# Patient Record
Sex: Male | Born: 1950 | Race: White | Hispanic: No | Marital: Married | Smoking: Never smoker
Health system: Southern US, Community
[De-identification: ages and names within clinical notes are randomized; demographics above are authoritative.]

## PROBLEM LIST (undated history)

## (undated) DIAGNOSIS — E785 Hyperlipidemia, unspecified: Secondary | ICD-10-CM

## (undated) DIAGNOSIS — M109 Gout, unspecified: Secondary | ICD-10-CM

## (undated) DIAGNOSIS — I469 Cardiac arrest, cause unspecified: Secondary | ICD-10-CM

## (undated) DIAGNOSIS — K219 Gastro-esophageal reflux disease without esophagitis: Secondary | ICD-10-CM

## (undated) DIAGNOSIS — F99 Mental disorder, not otherwise specified: Secondary | ICD-10-CM

## (undated) DIAGNOSIS — I259 Chronic ischemic heart disease, unspecified: Secondary | ICD-10-CM

## (undated) DIAGNOSIS — E119 Type 2 diabetes mellitus without complications: Secondary | ICD-10-CM

## (undated) DIAGNOSIS — J449 Chronic obstructive pulmonary disease, unspecified: Secondary | ICD-10-CM

## (undated) DIAGNOSIS — R0602 Shortness of breath: Secondary | ICD-10-CM

## (undated) DIAGNOSIS — I2119 ST elevation (STEMI) myocardial infarction involving other coronary artery of inferior wall: Secondary | ICD-10-CM

## (undated) DIAGNOSIS — I10 Essential (primary) hypertension: Secondary | ICD-10-CM

## (undated) DIAGNOSIS — M1990 Unspecified osteoarthritis, unspecified site: Secondary | ICD-10-CM

## (undated) DIAGNOSIS — D126 Benign neoplasm of colon, unspecified: Secondary | ICD-10-CM

## (undated) DIAGNOSIS — I2510 Atherosclerotic heart disease of native coronary artery without angina pectoris: Secondary | ICD-10-CM

## (undated) DIAGNOSIS — R2689 Other abnormalities of gait and mobility: Secondary | ICD-10-CM

## (undated) DIAGNOSIS — K3580 Unspecified acute appendicitis: Secondary | ICD-10-CM

## (undated) DIAGNOSIS — K2270 Barrett's esophagus without dysplasia: Secondary | ICD-10-CM

## (undated) DIAGNOSIS — J45909 Unspecified asthma, uncomplicated: Secondary | ICD-10-CM

## (undated) DIAGNOSIS — I712 Thoracic aortic aneurysm, without rupture: Secondary | ICD-10-CM

## (undated) DIAGNOSIS — T7840XA Allergy, unspecified, initial encounter: Secondary | ICD-10-CM

## (undated) DIAGNOSIS — I517 Cardiomegaly: Secondary | ICD-10-CM

## (undated) DIAGNOSIS — C4442 Squamous cell carcinoma of skin of scalp and neck: Secondary | ICD-10-CM

## (undated) DIAGNOSIS — Z8679 Personal history of other diseases of the circulatory system: Secondary | ICD-10-CM

## (undated) DIAGNOSIS — F32A Depression, unspecified: Secondary | ICD-10-CM

## (undated) DIAGNOSIS — F329 Major depressive disorder, single episode, unspecified: Secondary | ICD-10-CM

## (undated) DIAGNOSIS — G4733 Obstructive sleep apnea (adult) (pediatric): Secondary | ICD-10-CM

## (undated) DIAGNOSIS — Z8371 Family history of colonic polyps: Secondary | ICD-10-CM

## (undated) DIAGNOSIS — S63259A Unspecified dislocation of unspecified finger, initial encounter: Secondary | ICD-10-CM

## (undated) DIAGNOSIS — K529 Noninfective gastroenteritis and colitis, unspecified: Secondary | ICD-10-CM

## (undated) DIAGNOSIS — R7303 Prediabetes: Secondary | ICD-10-CM

## (undated) DIAGNOSIS — Z83719 Family history of colon polyps, unspecified: Secondary | ICD-10-CM

## (undated) DIAGNOSIS — G2581 Restless legs syndrome: Secondary | ICD-10-CM

## (undated) DIAGNOSIS — Z86010 Personal history of colonic polyps: Secondary | ICD-10-CM

## (undated) DIAGNOSIS — F419 Anxiety disorder, unspecified: Secondary | ICD-10-CM

## (undated) DIAGNOSIS — R002 Palpitations: Secondary | ICD-10-CM

## (undated) HISTORY — PX: PR TOTAL HIP ARTHROPLASTY: 27130

## (undated) HISTORY — DX: Chronic ischemic heart disease, unspecified: I25.9

## (undated) HISTORY — PX: PR REMOVAL OF TONSILS,<12 Y/O: 42825

## (undated) HISTORY — DX: Cardiac arrest, cause unspecified: I46.9

## (undated) HISTORY — DX: Unspecified asthma, uncomplicated: J45.909

## (undated) HISTORY — DX: Gout, unspecified: M10.9

## (undated) HISTORY — DX: Barrett's esophagus without dysplasia: K22.70

## (undated) HISTORY — DX: Unspecified acute appendicitis: K35.80

## (undated) HISTORY — PX: EGD: GILAB00003

## (undated) HISTORY — PX: PR INSERT INTRACORONARY STENT: 92980

## (undated) HISTORY — DX: Hyperlipidemia, unspecified: E78.5

## (undated) HISTORY — DX: Essential (primary) hypertension: I10

## (undated) HISTORY — PX: COLONOSCOPY: GILAB00002

## (undated) HISTORY — DX: Type 2 diabetes mellitus without complications: E11.9

## (undated) HISTORY — PX: PR COLONOSCOPY W/BIOPSY SINGLE/MULTIPLE: 45380

## (undated) HISTORY — PX: PR APPENDECTOMY: 44950

## (undated) HISTORY — DX: Gastro-esophageal reflux disease without esophagitis: K21.9

## (undated) HISTORY — DX: Benign neoplasm of colon, unspecified: D12.6

## (undated) HISTORY — DX: ST elevation (STEMI) myocardial infarction involving other coronary artery of inferior wall: I21.19

## (undated) HISTORY — PX: PR COLONOSCOPY FLX DX W/COLLJ SPEC WHEN PFRMD: 45378

## (undated) HISTORY — PX: PR EDG TRANSORAL BIOPSY SINGLE/MULTIPLE: 43239

## (undated) HISTORY — DX: Allergy, unspecified, initial encounter: T78.40XA

## (undated) HISTORY — PX: CORONARY ANGIOPLASTY WITH STENT PLACEMENT: SHX49

## (undated) HISTORY — PX: APPENDECTOMY: SHX54

## (undated) HISTORY — DX: Restless legs syndrome: G25.81

## (undated) HISTORY — DX: Anxiety disorder, unspecified: F41.9

## (undated) HISTORY — DX: Atherosclerotic heart disease of native coronary artery without angina pectoris: I25.10

## (undated) HISTORY — DX: Major depressive disorder, single episode, unspecified: F32.9

## (undated) HISTORY — PX: CARDIAC ELECTROPHYSIOLOGY MAPPING AND ABLATION: SHX1292

## (undated) HISTORY — DX: Prediabetes: R73.03

## (undated) HISTORY — PX: CORONARY ARTERY BYPASS GRAFT: SHX141

## (undated) HISTORY — DX: Depression, unspecified: F32.A

## (undated) HISTORY — PX: TOTAL HIP ARTHROPLASTY: SHX124

## (undated) HISTORY — DX: Noninfective gastroenteritis and colitis, unspecified: K52.9

## (undated) HISTORY — DX: Personal history of colonic polyps: Z86.010

## (undated) HISTORY — DX: Family history of colon polyps, unspecified: Z83.719

## (undated) HISTORY — DX: Squamous cell carcinoma of skin of scalp and neck: C44.42

## (undated) HISTORY — DX: Family history of colonic polyps: Z83.71

## (undated) HISTORY — DX: Personal history of other diseases of the circulatory system: Z86.79

## (undated) HISTORY — DX: Obstructive sleep apnea (adult) (pediatric): G47.33

---

## 1989-10-04 HISTORY — PX: ANKLE FRACTURE SURGERY: SHX122

## 1993-10-04 DIAGNOSIS — I259 Chronic ischemic heart disease, unspecified: Secondary | ICD-10-CM

## 1993-10-04 HISTORY — DX: Chronic ischemic heart disease, unspecified: I25.9

## 1994-10-04 DIAGNOSIS — I469 Cardiac arrest, cause unspecified: Secondary | ICD-10-CM

## 1994-10-04 DIAGNOSIS — I2119 ST elevation (STEMI) myocardial infarction involving other coronary artery of inferior wall: Secondary | ICD-10-CM

## 1994-10-04 HISTORY — DX: Cardiac arrest, cause unspecified: I46.9

## 1994-10-04 HISTORY — DX: ST elevation (STEMI) myocardial infarction involving other coronary artery of inferior wall: I21.19

## 2004-10-04 HISTORY — PX: EGD: GILAB00003

## 2006-12-03 HISTORY — PX: COLONOSCOPY: GILAB00002

## 2007-04-06 ENCOUNTER — Ambulatory Visit

## 2007-04-28 ENCOUNTER — Encounter: Payer: Self-pay | Admitting: Internal Medicine

## 2007-04-28 ENCOUNTER — Ambulatory Visit: Admitting: Internal Medicine

## 2007-04-28 VITALS — BP 143/93 | HR 82 | Temp 98.7°F | Resp 18 | Ht 71.0 in | Wt 231.0 lb

## 2007-04-28 DIAGNOSIS — K219 Gastro-esophageal reflux disease without esophagitis: Secondary | ICD-10-CM

## 2007-04-28 DIAGNOSIS — E785 Hyperlipidemia, unspecified: Secondary | ICD-10-CM | POA: Insufficient documentation

## 2007-04-28 DIAGNOSIS — D126 Benign neoplasm of colon, unspecified: Secondary | ICD-10-CM

## 2007-04-28 DIAGNOSIS — Z800 Family history of malignant neoplasm of digestive organs: Secondary | ICD-10-CM | POA: Insufficient documentation

## 2007-04-28 DIAGNOSIS — I10 Essential (primary) hypertension: Secondary | ICD-10-CM | POA: Insufficient documentation

## 2007-04-28 DIAGNOSIS — K2270 Barrett's esophagus without dysplasia: Secondary | ICD-10-CM

## 2007-04-28 DIAGNOSIS — M109 Gout, unspecified: Secondary | ICD-10-CM

## 2007-04-28 HISTORY — DX: Gout, unspecified: M10.9

## 2007-04-28 HISTORY — DX: Benign neoplasm of colon, unspecified: D12.6

## 2007-04-28 HISTORY — DX: Gastro-esophageal reflux disease without esophagitis: K21.9

## 2007-04-28 HISTORY — DX: Barrett's esophagus without dysplasia: K22.70

## 2007-04-28 MED ORDER — VYTORIN 10MG-40MG TABLETS
ORAL_TABLET | ORAL | Status: AC
Start: 2007-04-28 — End: 2008-04-27

## 2007-04-28 MED ORDER — PLAVIX 75 MG TABLET
ORAL_TABLET | ORAL | Status: DC
Start: 2007-04-28 — End: 2008-04-11

## 2007-04-28 MED ORDER — LISINOPRIL 20 MG-HYDROCHLOROTHIAZIDE 25 MG TABLET
ORAL_TABLET | ORAL | Status: DC
Start: 2007-04-28 — End: 2007-05-01

## 2007-04-28 MED ORDER — ALPRAZOLAM 0.25 MG TABLET
ORAL_TABLET | ORAL | Status: DC
Start: 2007-04-28 — End: 2007-10-27

## 2007-04-28 MED ORDER — COREG CR 40 MG CAPSULE, EXTENDED RELEASE
EXTENDED_RELEASE_CAPSULE | ORAL | Status: DC
Start: 2007-04-28 — End: 2007-07-28

## 2007-04-28 MED ORDER — ALLOPURINOL 300 MG TABLET
ORAL_TABLET | ORAL | Status: DC
Start: 2007-04-28 — End: 2008-04-11

## 2007-04-28 MED ORDER — VIAGRA 50 MG TABLET
ORAL_TABLET | ORAL | Status: DC
Start: 2007-04-28 — End: 2008-01-18

## 2007-04-28 NOTE — Patient Instructions (Signed)
PLEASE STOP AT THE FRONT DESK TO SCHEDULE A LAB APPOINTMENT for new lab tests that have been ordered. PLEASE DO THESE TESTS WITHIN 1-2 WEEKS, after a 12 hour fast (nothing to eat or drink except water). The lab tests have been ordered through the computer, no paper lab slip is needed. The lab is open from 8:00 AM to 4:15 Monday to Friday.  The lab is located on the first floor of the clinic building in the waiting area for [address]. The lab results will be sent to you by mail in approximately 2 weeks.    An electronic referral has been submitted to CARDIOLOGY for further evaluation and treatment of your problem. You should receive an approval notice in the mail within 2 weeks. After the approval notice has been received, or if a notice is not received within two weeks, you may call [phone] or [phone] to schedule your appointment.

## 2007-04-28 NOTE — Nursing Note (Signed)
>>   Lavella Lemons, RN, RN     [name] Apr 28, 2007  4:17 PM  Immunization VIS documentation(s) were given to [name] to review. All questions were answered and the patient consented to the Immunization(s) being given. Patient allergies were reviewed and no contraindications were found. The immunization(s) were given as ordered. The patient was observed for any immediate reactions to the vaccine. None were observed.  Lavella Lemons, RN    >> [name]     Fri Apr 28, 2007  3:03 PM  Vital signs taken, allergies verified, screened for pain, med hx taken,  screened for chicken pox, and verified immunization status.  [name], MA

## 2007-04-28 NOTE — Progress Notes (Signed)
SUBJECTIVE: [name] is a 84yr male who comes in to establish care and discuss his current medical problems.   Chief Complaint   Patient presents with    Blood Pressure     est care/ refill           Generally doing well. Likes it here. From [place]. More relaxed, less stress. Keeping active. Lost about 10 lbs. No anginal symptoms. No exertional chest pain or shortness of breath. Recent stents times 3, thinks had clotting of previous stents due to stopping plavix and aspirin for 1 week prior to colonoscopy.  Feels fine now. Reports compliance with medications. No side effects except some increased bruising due to medications.     Complicated cardiac history including prior MI, cardiac arrest and multiple cardiac procedures. No arrhythmia history. Will be seeing Dr Tiburcio Bash Low for cardiac care and apparently already in touch with his office regarding records from [place] and follow up visit.    History of gout. No flares in 20 years on medications. No history of gouty arthritis.    History of hypertension. Generally well controlled. Blood pressure a bit higher since moving due to not exercising on treadmill as regularly. The patient denies chest pain, dyspnea, edema, or TIA's.     History of increased lipids. On vytorin and lopid. Reports good response although lipid levels unknown by patient. Reports no medication side effects.    History of colon polyps. Recent colonoscopy. "flat polyp" removed. Usually needs follow up colonoscopy every 3 years. Mom had colon cancer.  No change in bowel movements or concerns, abdominal pain.     History of GERD and Barretts esophagus. Symptoms well controlled on PPI twice daily dosing.  No dysphagia, odynophagia, unwanted weight loss or abdominal pain.    History of anxiety. High stress work in [place]. Better here but still needs xanax--most days 1-2 pills, some days not at all. Denies depressive symptoms. No medication side effects.    History of alcohol use. Chronic. States no liver  enzyme abnormalities due to this. Not interested in decreased use. Denies alcohol related problems.     REVIEW OF SYSTEMS: as noted above for pertinents. (Also see new patient medical history questionnaire which is scanned into the record.)   ALLERGIES: No Known Allergies.    Past Medical History   Diagnosis Date    CHRONIC ISCHEMIC HEART DISEASE UNSPECIFIED 1995     s/p MI and multiple stents    ESOPHAGEAL REFLUX 04/28/2007    BARRETT'S ESOPHAGUS 04/28/2007    BENIGN NEOPLASM OF COLON 04/28/2007    GOUT UNSPECIFIED 04/28/2007    BENIGN ESSENTIAL HYPERTENSION     OTHER AND UNSPECIFIED HYPERLIPIDEMIA     CARDIAC ARREST 1996     ischemic related     MI INFERIORUN SPEC EPISODE 1996        History   Substance Use Topics    Tobacco Use: Quit -- 2.0 packs/day for 10 years      quit at age 56    Alcohol Use: Yes      3-4 drinks per day; wine or vodka or both      I reviewed and entered patient's past medical and family/social history.    PHYSICAL EXAM: BP 143/93  Pulse 82  Temp (Src) 37.1 C (98.7 F) (Tympanic)  Resp 18  Ht 1.803 m (5\' 11" )  Wt 104.781 kg (231 lb)   He appears well, in no apparent distress.  Alert and oriented, pleasant and cooperative.  Eye exam is normal; sclera nonicteric, conjuctiva not injected.  Oropharynx is clear without lesions or exudate.   Neck is supple and free of adenopathy or masses, the thyroid is normal without enlargement, palpable nodules or tenderness. No carotid bruits.  Chest is clear to auscultation bilaterally. No wheezing, crackles or rhonchi. Normal symmetric air entry throughout both lung fields. No chest wall tenderness.  Heart exam shows regular rate and rhythm. No murmurs, rubs or gallops. No edema or JVD.  The abdomen is soft, nontender, no rebound or guarding, no palpable masses and no organomegaly noted. Bowel sounds are normal. No CVA tenderness noted.  Extremities: joints unremarkable, no deformities, cyanosis, clubbing or edema.  Skin: tanned, no suspicious  lesions or rashes. Some bruising noted.  Mental status exam: he is alert, oriented to time, person and place. Normal thought content, speech, affect, mood and grooming are noted.       Impression/Plan:      Encounter Diagnoses   Code Name Primary? Qualifier    Dx: CHRONIC ISCHEMIC HEART DISEASE UNSPECIFIED  Extensive history but symptoms stable  ECG today notable for old MI, inferiorly  No acute changes  Follow up with Cardiology as planned  Follow up sooner as needed  Yes     Plan: LIPID PANEL WITH DLDL REFLEX     COMPREHENSIVE METABOLIC PANEL     CBC AUTO + REFLEX MANUAL DIFF     TSH (SENSITIVE)     POC ELECTROCARDIOGRAM WITH RHYTHM STRIP     PLAVIX 75 MG TAB     PROTONIX 40 MG TAB     COREG CR 40 MG 24 HR CAP     LISINOPRIL-HYDROCHLOROTHIAZIDE 20 MG-25 MG TAB     GEMFIBROZIL 600 MG TAB     ANACIN 400 MG-32 MG TAB     VYTORIN 10MG -40MG  TABLETS     CARDIOLOGY REFERRAL    Dx: ESOPHAGEAL REFLUX  Stable on medications  Continue meds      Dx: BARRETT'S ESOPHAGUS  Will needs records from prior MD  Likely needs EGD every 3 years      Dx: BENIGN NEOPLASM OF COLON  Surveillance colonoscopy every 3 yrs for now  Get records      Dx: FAMILY HX GI MALIGNANCY  See above      Dx: GOUT UNSPECIFIED  Stable  Continue medications       Plan: URIC ACID, BLOOD     ALLOPURINOL 300 MG TAB    Dx: SCREENING MAL NEOP-PROSTATE      Plan: PSA SCREEN    Dx: VACCINE FOR TETANUS/DIPHTERIA      Plan: TD IMMUNIZATION,IM/JET>7YO    Dx: ANXIETY STATE UNSPECIFIED      Plan: ALPRAZOLAM 0.25 MG TAB    Dx: BENIGN ESSENTIAL HYPERTENSION  Repeat blood pressure in office by me 132/78  Continue medications       Plan: COREG CR 40 MG 24 HR CAP     LISINOPRIL-HYDROCHLOROTHIAZIDE 20 MG-25 MG TAB    Dx: OTHER AND UNSPECIFIED HYPERLIPIDEMIA  Needs liver enzymes for monitoring for medication side effects in light of alcohol use  For now no changes in medications   Labs as above       Plan: GEMFIBROZIL 600 MG TAB     VYTORIN 10MG -40MG  TABLETS        Barriers to Learning assessed: none. Patient verbalizes understanding of teaching and instructions.    Jennene Downie M.D.

## 2007-05-01 ENCOUNTER — Other Ambulatory Visit: Payer: Self-pay | Admitting: Internal Medicine

## 2007-05-01 MED ORDER — LISINOPRIL 20 MG-HYDROCHLOROTHIAZIDE 25 MG TABLET
ORAL_TABLET | ORAL | Status: DC
Start: 2007-05-01 — End: 2008-04-11

## 2007-05-01 MED ORDER — GEMFIBROZIL 600 MG TABLET
ORAL_TABLET | ORAL | Status: DC
Start: 2007-05-01 — End: 2008-04-11

## 2007-05-02 ENCOUNTER — Telehealth: Payer: Self-pay | Admitting: Internal Medicine

## 2007-05-02 NOTE — Telephone Encounter (Signed)
Patient states pharmacy did not receive the refill request on Gemfibrozil 600mg  1 twice a day.  Please resend.    [name]

## 2007-05-02 NOTE — Telephone Encounter (Signed)
Refaxed rx to patient pharmacy. [name], MA

## 2007-05-09 ENCOUNTER — Ambulatory Visit

## 2007-05-11 ENCOUNTER — Encounter: Admitting: Cardiovascular Disease

## 2007-05-22 ENCOUNTER — Encounter

## 2007-05-22 ENCOUNTER — Ambulatory Visit

## 2007-06-29 ENCOUNTER — Telehealth: Payer: Self-pay | Admitting: Internal Medicine

## 2007-06-29 ENCOUNTER — Ambulatory Visit

## 2007-06-29 MED ORDER — FLONASE 50 MCG/ACTUATION NASAL SPRAY,SUSPENSION
NASAL | Status: DC
Start: 2007-06-29 — End: 2008-11-25

## 2007-06-29 NOTE — Telephone Encounter (Signed)
Patient called and stated that he would like to know if Dr Felicita Gage can prescribe Flonase for him. Patient was taking this in the past and would like to get back on the medication. Patient stated that he is suffering from sneezing, watery eyes, itchy throat for about 10 days and this medicationalways seems to make it better. Please call patient and advise.    Thank you  [name]  MOSC II

## 2007-06-29 NOTE — Telephone Encounter (Signed)
Patient was advised of dr knoepflers message. Stefanie Murphy, MA

## 2007-06-29 NOTE — Telephone Encounter (Signed)
Prescription for flonse sent.   Please advise patient to follow up in the office if symptoms not improved within 2 weeks with above medications.   Comfort Iversen M.D.

## 2007-07-05 ENCOUNTER — Ambulatory Visit

## 2007-07-05 ENCOUNTER — Ambulatory Visit: Admitting: Cardiovascular Disease

## 2007-07-05 DIAGNOSIS — M109 Gout, unspecified: Secondary | ICD-10-CM | POA: Insufficient documentation

## 2007-07-05 NOTE — Nursing Note (Signed)
>>   [name] [facility]     Wed Jul 05, 2007  1:10 PM  Vitals signs taken,screened for pain,allergies verified, pharmacy verified, EKG done. Baxter Kail, [place]

## 2007-07-11 NOTE — Progress Notes (Addendum)
July 08, 2007 RE: [name], [name]    MR#: [id number]   DOB: [birth year]   Date of Service: 07/05/2007       Maxie Better, DO  [address] [place], [place] [address]    Dear Dr. Hermelinda Medicus:    Thank you for referring [name] to our Cardiology Clinic to establish cardiac care after moving to [place].     As you know, he is a 56 year old gentleman with a history of hypertension, hyperlipidemia, and long history of coronary artery disease. The patient was doing well until 20 when he was in [place], [place], when he developed ischemic cardiac symptoms. He returned to [place] and saw you in consultation, at which time he underwent cardiac catheterization with angiography. Percutaneous coronary intervention was performed and he went on to make a good recovery. Since that time, the patient has had multiple hospitalizations and interventions of all three coronary arteries. He had stents placed in the LAD, left circumflex, and many stents in the right coronary artery. His most recent intervention was in February 2008 and he had in-stent restenosis of the right coronary artery in four locations treated with Taxus drug-eluding stents. Previously, he had Cypher drug-eluding stents, which had developed in-stent restenosis and before that Taxus stents were implanted in the right coronary artery.     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 Since moving to [place], he has had absolutely no chest discomfort of angina pectoris or myocardial infarction. He denies any dyspnea, orthopnea, or paroxysmal nocturnal dyspnea. He has no palpitations, dizziness, or syncope. His coronary artery disease risk factors are positive for a history of cigarette smoking, but none since age 61. He has had hypertension, treated with medical therapy,  as well as elevated lipids. His highest cholesterol that he recalls was 290. There is no family history of premature heart disease and he does not have diabetes.     PAST MEDICAL HISTORY: Illnesses: Usual childhood illnesses. Adult diseases: 1) Long history of coronary artery disease. 2) Hypertension. 3) Hyperlipidemia. 4) Erectile dysfunction. 5) History of gout. 6) Anxiety.     OPERATIONS: 1) Ankle surgery. 2) Tonsillectomy.    ALLERGIES TO MEDICATIONS: KEFLEX - CAUSED A SKIN RASH.    Medications: Aspirin in the form of Anacin, 2 daily, Plavix 75 mg daily, lisinopril/hydrochlorothiazide 20/25, 1 daily, gemfibrozil 600 mg b.i.d., Protonix 40 mg daily, Coreg CR 40 mg daily, Viagra 50 mg p.r.n., alprazolam 0.25 mg q.8h p.r.n., Vytorin 10/40 daily, Omega-3 fatty acids 4800 mg daily, and nitroglycerin 0.4 mg sublingual p.r.n. chest pain, allopurinol 300 mg daily.    SOCIAL HISTORY: The patient is married and his wife works with a Counselling psychologist. He runs a Public affairs consultant, which is a family operation in [place], [place]. He has no children. The patient smoked briefly in the past and drinks socially.     FAMILY HISTORY: Father died at age 58 and mother died at age 77, and neither had premature heart disease.     REVIEW OF SYSTEMS: Constitutional: No fever or chills, good appetite. Weight has been stable. HEENT: No headaches, visual disturbances. Hearing satisfactory. Denies epistaxis, no oral lesions. Respiratory: No cough, sputum, or hemoptysis. Gastrointestinal: No nausea, vomiting, diarrhea, constipation, melena, or hematochezia. Genitourinary: No urgency, frequency, or dysuria. Extremities:  Arthritis. Neurologic: No seizures, stroke.    PHYSICAL EXAM: Pleasant, mildly overweight male, in no acute distress. Blood pressure 119/82, pulse 94, respirations 16, weight 229 pounds. Skin: Warm and dry. HEENT: Normocephalic, without evidence of trauma. Pupils round, reactive to light. Extraocular movements intact. Hearing  normal. Nasal mucosa normal. Oropharynx clear. Neck supple, thyroid not enlarged. Chest: Clear to auscultation and percussion. Cardiovascular: Jugular venous pressure 6 cm of water, carotid pulses normal. PMI is not appreciated by inspection or palpation. S4 gallop at the apex, I/VI systolic murmur at the lower left sternal border. Abdomen: Bowel sounds normal. The abdomen is soft and nontender. There is no organomegaly. Extremities: No evidence of clubbing, cyanosis, or edema. Pulses are 2+ and bilaterally symmetric. Neurologic: Physiologic.    Electrocardiogram: Normal sinus rhythm, mild IVCD, nonspecific ST-T wave changes, nondiagnostic Qs in II, III, and AVF.    ASSESSMENT:    1. Prior history of myocardial infarction, 1995 - presumably small infarction, heart function well preserved.    2. Percutaneous coronary intervention, 1995 - presumably he had an implantation of bare metal stents at that time.    3. Multiple coronary interventions since that time - he has known stents in the LAD, left circumflex, and many stents in the right coronary artery, for a total of 25 stents, according to the patient. Hs most recent treatment was in February 2008 for in-stent restenosis for four lesions in the right coronary artery and Taxus drug-eluding stents were implanted at that time. Previously, Cypher stents had been implanted in the right coronary artery, and prior to that he had Taxus stents in the right coronary artery, presumably he has had at least three layers of stents in the right coronary artery. The patient will be treated with aspirin indefinitely, Plavix for indefinitely. He will have a baseline functional study since he has no symptoms at this time and we would like to establish a baseline for future comparison.     4. Hypertension - well controlled on present regimen, although since he is somewhat tachycardiac his Coreg will be changed to Coreg 25 b.i.d. This will be generic because it will be cheaper for him.      5. Hyperlipidemia - good lipid profile. Most recent cholesterol is 179 with LDL 62, HDL 32.    6. History of gout - check uric acid. Continue allopurinol.    7. Gastroesophageal reflux disease - PPI.    PLAN:    1. Functional study.    2. Increase Coreg to 25 mg b.i.d.    3. Routine followup in four months.    I appreciate this opportunity to participate in the care of [name].     Best personal regards,        REGINALD I LOW, MD  PROFESSOR AND CHIEF  DIVISION OF CARDIOVASCULAR MEDICINE  DEPARTMENT OF INTERNAL MEDICINE  THIS WAS ELECTRONICALLY SIGNED - 07/11/2007 11:00 PM PST BY:                   UEA:[id number])    D: 07/08/2007 04:56 PM  T: 07/11/2007 08:45 AM  C#: [id number]    cc:   Woodward Ku, MD  PCN [facility] [place]

## 2007-07-28 ENCOUNTER — Telehealth: Payer: Self-pay | Admitting: Cardiovascular Disease

## 2007-07-28 ENCOUNTER — Ambulatory Visit: Admitting: Internal Medicine

## 2007-07-28 ENCOUNTER — Encounter: Payer: Self-pay | Admitting: Internal Medicine

## 2007-07-28 ENCOUNTER — Ambulatory Visit

## 2007-07-28 VITALS — BP 110/72 | HR 105 | Temp 97.9°F | Resp 18 | Ht 71.0 in | Wt 230.7 lb

## 2007-07-28 MED ORDER — BETAMETHASONE, AUGMENTED 0.05 % LOTION
TOPICAL_LOTION | TOPICAL | Status: AC
Start: 2007-07-28 — End: 2008-07-27

## 2007-07-28 MED ORDER — PROTONIX 40 MG TABLET,DELAYED RELEASE
DELAYED_RELEASE_TABLET | ORAL | Status: DC
Start: 2007-07-28 — End: 2008-07-11

## 2007-07-28 NOTE — Nursing Note (Signed)
>>   [name]     Fri Jul 28, 2007  3:48 PM  The patient completed the flu questionnaire and signed the consent. The Inactivated Influenza Vaccine VIS document was given to patient  to review and any questions were referred to the physician. The consent was then reviewed for any Yes answers.  All questions were answered as no. The Influenza Vaccine was then administered per protocol. The patient was observed for immediate reactions to the vaccine per protocol. None were observed.  [name], MA    >> [name]     Fri Jul 28, 2007  1:37 PM  Vital signs taken, allergies verified, screened for pain, med hx taken,  screened for chicken pox, and verified immunization status.  [name], MA

## 2007-07-28 NOTE — Telephone Encounter (Signed)
Called [employer] to clarify message.  Spoke with pharmacist Brett Canales.  I told him that both medications were prn.  He stated because these meds were prescribed by different doctors they wanted to make sure that the doctor's were aware that the patient was taking both.  Per office note dated 07/05/07 by Dr. Rachell Cipro, both medications are listed.  Kerry Kass, RN

## 2007-07-28 NOTE — Progress Notes (Signed)
[name] is a 56yr old male presents for follow up.  Has seen Dr Low in the interim. Changed to carvedilol twice daily instead of coregCR. Feels well. No significant chest pain or shortness of breath. Exercise tolerance is good. Functional study being planned for January.    Recent labs notable for increased glucose, liver enzymes and markedly increased TG although apparently this is better than baseline.  Does drink alcohol and no intention to stop.  No abdominal pain or nausea or vomitting or dysphagia.  Has history of Barretts and needs surveillance EGD due to this.  Trying to lose weight and increase exercise.  Otherwise reports compliance with medications.     Also requesting refill of topical betamethasone lotion. Intermittently has scalp dermatitis or psoriasis which needs topical medications. Usually only needs to treat for a few days at a time.  No other skin concerns.    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    FAMILY HX GI MALIGNANCY V16.0    GOUT UNSPECIFIED 274.9    BENIGN ESSENTIAL HYPERTENSION 401.1    OTHER AND UNSPECIFIED HYPERLIPIDEMIA 272.4    ANXIETY STATE UNSPECIFIED 300.00    Gout 274.9E      History   Substance Use Topics    Tobacco Use: Quit -- 2.0 packs/day for 10 years      quit at age 56    Alcohol Use: Yes      3-4 drinks per day; wine or vodka or both      OBJECTIVE:  BP 110/72  Pulse 105  Temp (Src) 36.6 C (97.9 F) (Tympanic)  Resp 18  Ht 1.803 m (5\' 11" )  Wt 104.645 kg (230 lb 11.2 oz)  He appears well, in no apparent distress.  Alert and oriented, pleasant and cooperative.  Skin without rashes at this time       Impression/Plan:      Encounter Diagnoses   Code Name Primary? Qualifier    Dx: Unspecified Chronic Ischemic Heart Disease  Blood pressure check with nurse after blood pressure medications  changed  Follow up as needed if increased blood pressure or new concerns  Otherwise follow up 6 months  Yes     Plan: CARVEDILOL 25 MG TAB     LIPID PANEL WITH DLDL REFLEX    Dx: Flu (Influenza Vaccination)      Plan: INFLUENZA VACCINE, ADULT, TRACE THIMEROSAL    Dx: Barrett's Esophagus      Plan: PROTONIX 40 MG TAB     GI LAB REFERRAL    Dx: Esophageal Reflux  Continue protonix      Dx: Hyperglycemia  Discussed   Diet, weight loss and decrease alcohol advised  Follow up labs       Plan: HEMOGLOBIN A1C    Dx: Abnormal Liver Function  Likely related to alcohol intake  Discussed  Follow up labs      Plan: COMPREHENSIVE METABOLIC PANEL    Dx: Dermatitis      Plan: BETAMETHASONE, AUGMENTED 0.05 % LOTION       Barriers to Learning assessed: none. Patient verbalizes understanding of teaching and instructions.    Valerie Cones M.D.

## 2007-07-28 NOTE — Patient Instructions (Signed)
A referral for further evaluation and treatment has been submitted to the Gastroenterology: various locations, [phone]. You should receive an approval notice in the mail within 2 weeks. After the approval notice has been received, or if a notice is not received within two weeks, you may call the number above to schedule your appointment.    PLEASE STOP AT THE FRONT DESK TO SCHEDULE A LAB APPOINTMENT for new lab tests that have been ordered. PLEASE DO THESE TESTS within 4 MONTHS, after a 12 hour fast (nothing to eat or drink except water). The lab tests have been ordered through the computer, no paper lab slip is needed. The lab is open from 8:00 AM to 4:15 Monday to Friday.  The lab is located on the first floor of the clinic building in the waiting area for [address]. The lab results will be sent to you by mail in approximately 2 weeks.

## 2007-07-28 NOTE — Telephone Encounter (Signed)
Pharmacy is calling because there is a possible interaction between Nitroquick written by Dr. Rachell Cipro on 07/05/07 and Viagra 50 mg.  Patient is taking both medications.  Please contact pharmacy to further advise.   Use Order [phone] when calling back.

## 2007-08-22 ENCOUNTER — Telehealth: Payer: Self-pay | Admitting: Internal Medicine

## 2007-08-22 NOTE — Telephone Encounter (Signed)
Patient called and stated that he is being referred to Endoscopy in [facility] and would like to know if he can get a referral to have this done at [facility] with Dr Ceasar Mons. Phone number [phone] and fax [phone]. Please call patient and advise.    Thank you  Viviano Simas Nastor  MOSC II

## 2007-08-22 NOTE — Telephone Encounter (Signed)
Will hold for pCP in am

## 2007-08-23 NOTE — Telephone Encounter (Signed)
Referral coordinator please advise.  Anca Knoepfler M.D.

## 2007-08-24 NOTE — Telephone Encounter (Signed)
Patient has a PPO insurance.  I have faxed the order over to Arlyn Leak per patients request.    Lanna Poche, [facility]  Referral Coordinator

## 2007-09-04 ENCOUNTER — Telehealth: Payer: Self-pay | Admitting: Internal Medicine

## 2007-09-04 NOTE — Telephone Encounter (Signed)
Referral closed as patient declined appointment.  patient is having procedure  in [place] with [employer].  Rebecca Chidester  M.O.S.C. III

## 2007-09-06 ENCOUNTER — Telehealth: Payer: Self-pay | Admitting: Internal Medicine

## 2007-09-06 NOTE — Telephone Encounter (Signed)
Patient spouse in this am for an appointment with you. She had forgotten to mention during appointment of patient reaction post flu vaccination. He had not called you. She stated he received vaccine in the left deltoid  (07/28/07),and that evening , developed a red streak from injection site. His left hand also became very puffy and swollen. He couldn't move his fingers. Patient wife encouraged him to call ,he declined. She stated this lasted for 2-3 days. Madelon Lips, LVN

## 2007-09-07 NOTE — Telephone Encounter (Signed)
Noted and included in allergy section.  Abriel Hattery M.D.

## 2007-09-12 ENCOUNTER — Ambulatory Visit

## 2007-09-15 ENCOUNTER — Ambulatory Visit

## 2007-09-15 NOTE — Nursing Note (Signed)
>>   Lavella Lemons, RN, RN     [name] Sep 15, 2007  1:31 PM  Patient here for blood pressure check.  124/83, pulse 76.  Large cuff, sitting.  Lavella Lemons, RN

## 2007-10-03 ENCOUNTER — Telehealth: Payer: Self-pay | Admitting: Cardiovascular Disease

## 2007-10-03 NOTE — Telephone Encounter (Signed)
Patient has had several PCI's, the most recent in Feb 2008.  Will route to MD for review.  Donzetta Sprung, RN

## 2007-10-03 NOTE — Telephone Encounter (Signed)
Patient is scheduled for an Endoscopy on Jan 15th and will need to stop taking Plavix for 10 days prior to procedure.  He would like to know if this would be okay.  Please contact patient to advise.

## 2007-10-10 ENCOUNTER — Ambulatory Visit

## 2007-10-10 ENCOUNTER — Telehealth: Payer: Self-pay | Admitting: Internal Medicine

## 2007-10-10 NOTE — Telephone Encounter (Signed)
Late entry from 10/09/07  Received TC from patient, if patient to stop plavix for the procedure will need to stop today.  Discussed patient with Dr. Rachell Cipro.  Dr. Rachell Cipro stated patient unable to stop plavix because patient has multiple drug-eluting stents placed some areas with multiple layers of stents.  Patient is at high risk for stent thrombosis.  TC to patient and endoscopy center.  Endoscopy center stated patient unable to undergo procedure unless the patient discontinues plavix.  Endoscopy center will perform the procedure with the patient on Lovenox.  Discussed endoscopy center's recommendations with Dr. Rachell Cipro.  Dr. Rachell Cipro called patient.  Dr. Rachell Cipro recommended patient have a second opinion at [facility] at [phone].  Dr. Rachell Cipro stated that [facility] may perform the procedure with the patient on plavix.  Patient agrees to seek second opinion at [facility].

## 2007-10-10 NOTE — Telephone Encounter (Signed)
Noted, will hold for PCP , that patient cancelled her EGD

## 2007-10-10 NOTE — Telephone Encounter (Signed)
Patient had to cancer endoscopy with Dr. Edward Jolly.  Patient cannot stop taking Plavix for 10 days as Dr. Edward Jolly had requested.  Patient will have the procedure done by [facility] recommended by Dr. Tiburcio Bash Low, Cardiologist.  Dr. Rachell Cipro is taking care of sending referral to the GI group.      [name]

## 2007-10-11 ENCOUNTER — Ambulatory Visit

## 2007-10-11 NOTE — Nursing Note (Signed)
>>   Lavella Lemons, RN, RN     Wed Oct 11, 2007  1:50 PM  Here for blood pressure check.  116/76, sitting, large cuff.  Pulse is 88.  Lavella Lemons, RN

## 2007-10-13 NOTE — Telephone Encounter (Signed)
Referral has been resubmited as there is not one from Dr Low in system so I used one from 10/08. Clinic will contact patient to schedule.  Rebecca Chidester  M.O.S.C. III

## 2007-10-13 NOTE — Telephone Encounter (Signed)
Please forward my last referral to [facility] please. See message below.  Anca Knoepfler M.D.

## 2007-10-26 ENCOUNTER — Telehealth: Payer: Self-pay | Admitting: Internal Medicine

## 2007-10-26 NOTE — Telephone Encounter (Signed)
Patient called and stated that he would like a renewal on his medication Xanax. Please call patient if any questions. Patient also would like to let Dr Felicita Gage know that he went to see Dr Newt Lukes today with Hay[facility] Pao and he will be ordering his medical records from [place]. He would just like to let Dr Felicita Gage know.    Thank you  Viviano Simas Nastor  MOSC II

## 2007-10-27 MED ORDER — ALPRAZOLAM 0.25 MG TABLET
ORAL_TABLET | ORAL | Status: DC
Start: 2007-10-27 — End: 2008-01-23

## 2007-10-27 NOTE — Telephone Encounter (Signed)
Xanax refilled.  I don't see an appointment with Dr Newt Lukes in EMR on patient. Can we find out where this MD is and get records forwarded here.  Thanks.  Baylon Santelli M.D.

## 2007-10-27 NOTE — Telephone Encounter (Signed)
Spoke with patient advised him that i sent in his request to dr Felicita Gage. [name], MA

## 2007-10-30 ENCOUNTER — Encounter

## 2007-10-30 LAB — COMPREHENSIVE METABOLIC PANEL
Alanine Transferase (ALT): 43 U/L (ref 6–63)
Albumin: 4.2 g/dL (ref 3.4–4.8)
Alkaline Phosphatase (ALP): 43 U/L (ref 35–115)
Aspartate Transaminase (AST): 34 U/L (ref 15–43)
Bilirubin Total: 1 mg/dL (ref 0.3–1.3)
Calcium: 9.3 mg/dL (ref 8.6–10.5)
Carbon Dioxide Total: 25 meq/L (ref 24–32)
Chloride: 102 meq/L (ref 95–110)
Creatinine Blood: 0.98 mg/dL (ref 0.44–1.27)
E-GFR, African American: >60 SEE NOTE (ref >60)
E-GFR, Non-African American: >60 SEE NOTE (ref >60)
Glucose: 107 mg/dL (ref 70–110)
Potassium: 3.5 meq/L (ref 3.3–5.0)
Protein: 7.1 g/dL (ref 6.3–8.3)
Sodium: 138 meq/L (ref 135–145)
Urea Nitrogen, Blood (BUN): 13 mg/dL (ref 8–22)

## 2007-10-30 LAB — CBC WITH DIFFERENTIAL
Basophils % Auto: 0.3 %
Basophils Abs Auto: 0 K/MM3 (ref 0–0.2)
Eosinophils % Auto: 2.7 %
Eosinophils Abs Auto: 0.2 K/MM3 (ref 0–0.5)
Hematocrit: 44.9 % (ref 41–53)
Hemoglobin: 15.6 g/dL (ref 13.5–17.5)
Lymphocytes % Auto: 23.4 %
Lymphocytes Abs Auto: 1.7 K/MM3 (ref 1.0–4.8)
MCH: 33.6 pg — ABNORMAL HIGH (ref 27–33)
MCHC: 34.7 % (ref 32–36)
MCV: 96.7 UM3 (ref 80–100)
MPV: 7.4 UM3 (ref 6.8–10.0)
Monocytes % Auto: 13.3 %
Monocytes Abs Auto: 1 K/MM3 — ABNORMAL HIGH (ref 0.1–0.8)
Neutrophils % Auto: 60.3 %
Neutrophils Abs Auto: 4.4 K/MM3 (ref 1.80–7.70)
Platelet Count: 190 K/MM3 (ref 130–400)
RDW: 12.6 U (ref 0–14.7)
Red Blood Cell Count: 4.64 M/MM3 (ref 4.5–5.9)
White Blood Cell Count: 7.3 K/MM3 (ref 4.5–11.0)

## 2007-10-30 LAB — LIPID PANEL WITH DLDL REFLEX
Cholesterol: 182 mg/dL (ref 0–200)
HDL Cholesterol: 38 mg/dL (ref >=35)
LDL Cholesterol Calculation: 69 mg/dL (ref <130)
Non-HDL Cholesterol: 144 mg/dL (ref 0–160)
Total Cholesterol:HDL Ratio: 4.8 — ABNORMAL HIGH (ref <4.0)
Triglyceride: 377 mg/dL — ABNORMAL HIGH (ref 35–160)

## 2007-10-30 LAB — THYROID STIMULATING HORMONE: Thyroid Stimulating Hormone: 1.44 u[IU]/mL (ref 0.35–5.5)

## 2007-10-30 LAB — HEMOGLOBIN A1C: Hgb A1C: 6.1 % — ABNORMAL HIGH (ref 3.9–5.9)

## 2007-10-30 NOTE — Telephone Encounter (Signed)
Spoke with patient he is going to get medical records from his dr. Patient was advised that rx was sent in . [name], MA

## 2007-10-31 ENCOUNTER — Other Ambulatory Visit: Payer: Self-pay | Admitting: Cardiovascular Disease

## 2007-11-06 ENCOUNTER — Telehealth: Payer: Self-pay | Admitting: Internal Medicine

## 2007-11-06 NOTE — Telephone Encounter (Signed)
Referral closed as patient declined appointment.  Rebecca Chidester  M.O.S.C. III

## 2007-11-07 ENCOUNTER — Ambulatory Visit: Admitting: Cardiovascular Disease

## 2007-11-07 ENCOUNTER — Ambulatory Visit

## 2007-11-07 VITALS — BP 142/88 | HR 97 | Ht 71.0 in | Wt 242.3 lb

## 2007-11-07 MED ORDER — NIASPAN 500 MG TABLET,EXTENDED RELEASE
EXTENDED_RELEASE_TABLET | ORAL | Status: DC
Start: 2007-11-07 — End: 2009-03-12

## 2007-11-07 NOTE — Nursing Note (Signed)
>>   MAR[facility]     Tue Nov 07, 2007  4:28 PM  Vitals signs taken,screened for pain,allergies verified, pharmacy verified, EKG done. Baxter Kail, [place]

## 2007-11-16 ENCOUNTER — Encounter: Payer: Self-pay | Admitting: Internal Medicine

## 2007-11-17 ENCOUNTER — Encounter: Payer: Self-pay | Admitting: Internal Medicine

## 2007-12-19 ENCOUNTER — Encounter: Payer: Self-pay | Admitting: Internal Medicine

## 2008-01-03 ENCOUNTER — Ambulatory Visit

## 2008-01-03 ENCOUNTER — Telehealth: Payer: Self-pay | Admitting: Cardiovascular Disease

## 2008-01-03 NOTE — Telephone Encounter (Signed)
I called [name] to schedule a cardiac angiogram for 01/09/2008 with 0600 arrival.  He agreed to the date and time. Pre-procedure instructions were reviewed. Directions to [facility] given. Instructed not to eat or drink after midnight and to take his morning medications with a sip of water. He will get his lab work and CXR done tomorrow.  He will be staying overnight and will need a ride home. [name] verbalized understanding. He has no further questions for me at this time. JNeely,RN,CNS

## 2008-01-03 NOTE — Telephone Encounter (Signed)
Dr. Rachell Cipro called patient to discuss results of myocardial perfusion scan.  Patient has an abnormal scan with two areas of reversible ischemia noted.  Dr. Rachell Cipro recommends the patient have cardiac catheterization to rule out obstructive CAD.  Patient agrees to the procedure and requests that the angiogram be scheduled for 4/7.  Advised patient that I would contact the [facility] nurse for scheduling and she would be calling him for instructions.  Orders were placed for blood work and chest xray.  Patient will have these done prior to catheterization.

## 2008-01-04 ENCOUNTER — Encounter

## 2008-01-08 ENCOUNTER — Telehealth: Payer: Self-pay | Admitting: Cardiovascular Disease

## 2008-01-08 NOTE — Telephone Encounter (Signed)
Pre-procedure instructions reviewed with [name] for cardiac catheterization scheduled on 01/09/2008 with 0600 arrival. Directions to [facility] given. Instructed not to eat or drink after midnight and to take all his morning medications with a sip of water. He will be staying overnight and will need a ride home.  [name] verbalized understanding. He has no further questions for me at this time. JNeely,RN,CNS

## 2008-01-09 ENCOUNTER — Inpatient Hospital Stay: Admission: RE | Admit: 2008-01-09 | Admitting: Cardiovascular Disease

## 2008-01-09 MED ORDER — LIDOCAINE HCL 10 MG/ML (1 %) INJECTION SOLUTION
INTRAMUSCULAR | Status: AC
Start: 2008-01-09 — End: ?
  Filled 2008-01-09: qty 20

## 2008-01-09 MED ORDER — HEPARIN (PORCINE) 1,000 UNIT/ML INJECTION SOLUTION
INTRAMUSCULAR | Status: AC
Start: 2008-01-09 — End: ?
  Filled 2008-01-09: qty 10

## 2008-01-09 MED ORDER — HEPARIN (PORCINE) (PF) 1,000 UNIT/500 ML IN 0.9 % SODIUM CHLORIDE IV
INTRAVENOUS | Status: AC
Start: 2008-01-09 — End: ?
  Filled 2008-01-09: qty 1

## 2008-01-09 MED ORDER — ATROPINE 0.1 MG/ML INJECTION SYRINGE
0.5000 mg | INJECTION | INTRAMUSCULAR | Status: DC | PRN
Start: 2008-01-09 — End: 2008-01-09

## 2008-01-09 MED ORDER — DIAZEPAM 10 MG TABLET
10.0000 mg | ORAL_TABLET | Freq: Once | ORAL | Status: AC
Start: 2008-01-09 — End: 2008-01-09
  Administered 2008-01-09: 10 mg via ORAL
  Filled 2008-01-09: qty 1

## 2008-01-09 MED ORDER — ASPIRIN 325 MG TABLET,DELAYED RELEASE
325.0000 mg | DELAYED_RELEASE_TABLET | Freq: Every day | ORAL | Status: DC
Start: 2008-01-09 — End: 2008-01-09
  Filled 2008-01-09: qty 1

## 2008-01-09 MED ORDER — ADENOSINE (DIAGNOSTIC) 3 MG/ML INTRAVENOUS SOLUTION
INTRAVENOUS | Status: AC
Start: 2008-01-09 — End: ?
  Filled 2008-01-09: qty 40

## 2008-01-09 MED ORDER — D5 / 0.45% NACL IV INFUSION
INTRAVENOUS | Status: DC
Start: 2008-01-09 — End: 2008-01-09
  Administered 2008-01-09: 08:00:00 via INTRAVENOUS

## 2008-01-09 MED ORDER — MIDAZOLAM 1 MG/ML INJECTION SOLUTION
0.5000 mg | INTRAMUSCULAR | Status: DC
Start: 2008-01-09 — End: 2008-01-09

## 2008-01-09 MED ORDER — HEPARIN (PORCINE) (PF) 1,000 UNIT/500 ML IN 0.9 % SODIUM CHLORIDE IV
INTRAVENOUS | Status: AC
Start: 2008-01-09 — End: 2008-01-09
  Administered 2008-01-09: 12:00:00
  Filled 2008-01-09: qty 1

## 2008-01-09 MED ORDER — NITROGLYCERIN 0.4 MG SUBLINGUAL TABLET
0.4000 mg | SUBLINGUAL_TABLET | SUBLINGUAL | Status: DC | PRN
Start: 2008-01-09 — End: 2008-01-09

## 2008-01-09 MED ORDER — NACL 0.9% IV BOLUS - DURATION REQ
100.0000 mL | INTRAVENOUS | Status: DC | PRN
Start: 2008-01-09 — End: 2008-01-09

## 2008-01-09 MED ORDER — MIDAZOLAM 1 MG/ML INJECTION SOLUTION
INTRAMUSCULAR | Status: AC
Start: 2008-01-09 — End: ?
  Filled 2008-01-09: qty 4

## 2008-01-09 MED ORDER — MORPHINE 2 MG/ML INJECTION SYRINGE
2.0000 mg | INJECTION | INTRAMUSCULAR | Status: DC
Start: 2008-01-09 — End: 2008-01-09

## 2008-01-09 MED ORDER — MEPERIDINE (PF) 50 MG/ML INJECTION SOLUTION
INTRAMUSCULAR | Status: AC
Start: 2008-01-09 — End: ?
  Filled 2008-01-09: qty 1

## 2008-01-10 NOTE — Procedures (Addendum)
Section of Cardiovascular Medicine    Patient: [name], [name]   MR #: [id number]  Date of Birth: 1951/01/12  Date of Cardiac Catheterization: 01/09/2008  Referring Physician: Begin Here      NAME OF PROCEDURE: Procedures performed include the following:    1. Left heart catheterization.  2. Selective coronary angiography.  3. Left ventricular cineangiography.  4. Right iliofemoral angiography.  5. Fractional flow reserve pressure measurements in the right coronary artery.  6. Fractional flow reserve pressure measurements in the LAD.    MAIN FINDINGS:    1. Moderate disease involving the proximal right coronary artery with 16% stenosis, FFR of 0.85 after intravenous adenosine 140 mcg/kl/min.  2. Moderate disease involving the mid LAD with FFR measurement of 0.80.  3. Preserved LV function, estimated EF of 55%.  4. The aortic root angiography shows no significant aortic insufficiency and no evidence of aortic dissection. The aortic root does not appear to be dilated.    INDICATIONS:    Mr. Guzzetta is a 57 year old gentleman with a history of coronary artery disease, who has had multiple, multiple stents of his coronary arteries (25 stents in total). He was noted to have an abnormal stress test and was seen by Dr. Rachell Cipro in clinic, who therefore referred the patient for coronary angiography to determine his coronary anatomy, given the fact the patient has severe coronary artery disease with previous restenosis of his stents in the right coronary artery.    CONSENT:    Informed consent was obtained from the patient following complete discussion of risks and benefits.    PROCEDURE:    The patient was brought to the Cardiac Cath Lab in a stable, nonsedated state. Both groins were prepped and draped in the usual sterile manner. Local anesthesia and intravenous sedation was given as appropriate    Using the modified Seldinger technique, a 7-French sidearm sheath was inserted in the right femoral artery and right iliofemoral  angiography performed to confirm placement of the arterial sheath in the vein.    We then advanced a Judkins right 4, 7-French diagnostic catheter to the aortic root, where the right coronary ostium was engaged and multiple images obtained. We then exchanged it for a JL4 diagnostic catheter; however, the JL4 catheter was shown to be too short and, therefore, we exchanged it for a JL5 diagnostic catheter where we obtained multiple images of the left coronary artery. Because the catheter would pop in and out of the coronary artery, we then exchanged it for a JL6 diagnostic catheter. Obtained multiple images of the left coronary artery.    We then decided to pursue fractional flow reserve measurement of the LAD and the right coronary artery given the moderate stenosis. We, therefore, advanced a Volcano SmartWire fractional flow reserve wire into the LAD after appropriate preparation, including intracoronary nitroglycerin, appropriate normalization of pressures, etc.    The wire was passed into the distal LAD, after which intravenous adenosine 140 mcg/kg/min was started. The lowest FFR recorded was 0.80. The FFR was then removed and then confirmed to be normalized at the left main. Repeat angiography was performed to confirm that there was no evidence of injury to the LAD system.    We then removed the catheter and advanced a Judkins right 3.5, 7- [place] guide catheter where the right coronary ostium was engaged. We tried to pass the [facility] down the right coronary artery; however, due to significant stents in the right coronary artery it was difficult to traverse the  right coronary artery.    We, therefore, removed the SmartWire and used a BMW wire through a Finecross catheter, and managed to pass the Finecross catheter into the distal right coronary artery. We then exchanged out for the SmartWire and performed FFR measurements in the right coronary artery. The lowest FFR value recorded was 0.85, and we  confirmed normalization of pressures when the FFR wire was withdrawn back into the ostium of the right coronary artery.    We then removed the JR3.5 guide catheter and exchanged it for a 7- French angled pigtail catheter and crossed the aortic valve, into the left ventricle where left ventricular pressures were measured. Left ventricular cineangiography in the 30-degree RAO projection was performed. Formal pullback across the aortic valve revealed a 10-12 mm gradient.    We then repositioned the catheter to position it just above the aortic valve, and performed aortic root angiography in the LAO cranial projection. We then removed the catheter wire and completed the procedure.    The patient tolerated the procedure well. The total contrast dose was Visipaque 320, 330 cc. Total fluoro time is 18.3 minutes. MAIN FINDINGS:    1. Left ventricular cineangiography showed preserved LV function with an EF of 55%.    2. The aortic root appears to be very mildly dilated.    3. Selective coronary angiography.     A. Left main. The left main is a large vessel which gives rise to the LAD and circumflex arteries. The left main is free from significant angiographic disease.     B. LAD. The LAD is a large vessel which has previous stents in the proximal and mid portion. It does gives rise to a number of diagonal and septal branches. The LAD in its mid segment does have a moderate area of stenosis of about 60-70% stenosis, which on FFR interrogation gives a value of 0.80 which is truly moderate. Given the fact that he was asymptomatic, this vessel was left alone, especially in view of the fact that he has significant restenosis with history of multiple stenting procedure.     C. Circumflex coronary artery. Has previous stents placed, as well. It gives rise to an obtuse marginal branch vessel in a mid segment. There is a late-filling OM branch vessel in proximal segment, which is a small vessel. This is likely to be occluded  chronically and fills by collaterals, as well as from the LAD. The previously placed stent in the circumflex system is widely patent.     D. Right coronary artery. The right coronary artery is dominant and it gives rise distally to the right PDA and the posterolateral branches. There is a PL branch system which is occluded proximally, which is filled by collaterals from the left system. The right coronary artery has radiographically many stents noted, and there is moderate stenosis proximally in the mid segment which, on FFR measurement, give the lowest value of 0.85.    4. Right iliofemoral angiography shows the puncture site to be in the common femoral artery with no evidence of perforation or dissection.    5. Hemodynamics. Pressures: The LV pressure of 111/1 with end- diastolic 21. Aortic pressure is 110 with a mean of 90 mmHg.      SUMMARY:    Mr. Greenawalt is a 57 year old gentleman with history of coronary artery disease and history of multiple stenting. His cardiac cath shows moderate CAD with moderate stenosis in the LAD and the right coronary artery. The FFR measurements in  the LAD was 0.80, which is borderline significant. Given the fact the patient is asymptomatic, this is felt to be medically managed. The right coronary artery has an FFR value of 0.85, which is thought to be nonsignificant and, therefore, the patient is medically managed.    The patient will return for observation on the floor, and may return home later on this evening.               THIS WAS ELECTRONICALLY SIGNED - 01/15/2008 2:30 PM PST BY: Tiburcio Bash I LOW, MD  PROFESSOR AND CHIEF  SURGEON    THIS WAS ELECTRONICALLY SIGNED - 01/11/2008 6:46 PM PST BY: Agapito Games, MD  CLINICAL FELLOW  DEPARTMENT OF INTERNAL MEDICINE  ASSISTANT SURGEON     GM:[id number])    D: 01/09/2008 04:26 PM  T: 01/10/2008 08:49 AM  C#: [id number]

## 2008-01-11 ENCOUNTER — Ambulatory Visit: Admitting: Internal Medicine

## 2008-01-11 ENCOUNTER — Ambulatory Visit

## 2008-01-11 VITALS — BP 118/76 | HR 92 | Temp 97.1°F | Resp 18 | Ht 71.0 in | Wt 245.2 lb

## 2008-01-11 MED ORDER — ZITHROMAX 250 MG TABLET
ORAL_TABLET | ORAL | Status: AC
Start: 2008-01-11 — End: 2008-01-16

## 2008-01-11 NOTE — Nursing Note (Signed)
>>   STEFANIE MURPHY     Thu Jan 11, 2008 12:01 PM  Vital signs taken, allergies verified, screened for pain, med hx taken,  screened for chicken pox, and verified immunization status.  [name], MA

## 2008-01-11 NOTE — Progress Notes (Signed)
SUBJECTIVE: [name] is a 57yr old male who comes in today for evaluation of symptoms of an upper respiratory infection which he has had for the past few days. He has had productive cough, nasal congestion, sweats and chest congestion and tightness in his chest. Since this began the symptoms are gradually worsening.  He denies  fever, chills, wheezing and dypsnea. He reports mild relief from otc antitussives.    Recent angiogram. No medication changes. Known CAD but did not need new interventional procedures. Reports compliance with medications. No new anginal symptoms or cardiovascular symptoms of concern.    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  Alert and oriented, pleasant and cooperative.  ENT: right TM normal without fluid or infection, left TM red, dull, bulging, neck without nodes, pharynx erythematous without exudate and nasal mucosa congested.   Lungs: clear to auscultation  Heart exam shows regular rate and rhythm. No murmurs, rubs or gallops.        Impression/Plan:      382.9AF Otitis Media, Acute  (primary encounter diagnosis)  Comment:   Plan: ZITHROMAX 250 MG TAB            465.9R Upper Respiratory Infection  Comment: symptomatic medications; has cough syrup with codeine from  [place] which seems to help; advised ok to use as needed   Plan: follow up as needed       414.9 CHRONIC ISCHEMIC HEART DISEASE UNSPECIFIED  Symptoms stable; continue medications  Follow up with Cardiology as planned      Barriers to Learning assessed: none. Patient verbalizes understanding of teaching and instructions.    Anca Knoepfler M.D.

## 2008-01-18 ENCOUNTER — Other Ambulatory Visit: Payer: Self-pay | Admitting: Internal Medicine

## 2008-01-18 MED ORDER — VIAGRA 50 MG TABLET
ORAL_TABLET | ORAL | Status: DC
Start: 2008-01-18 — End: 2008-07-16

## 2008-01-23 ENCOUNTER — Other Ambulatory Visit: Payer: Self-pay | Admitting: Internal Medicine

## 2008-01-23 NOTE — Telephone Encounter (Signed)
Patient is calling to get a refill on his Alprazolam 0.25mg . There are no refills left on this prescription.     Thank you,  Rema Jasmine [facility]

## 2008-01-24 MED ORDER — ALPRAZOLAM 0.25 MG TABLET
ORAL_TABLET | ORAL | Status: DC
Start: 2008-01-23 — End: 2008-07-12

## 2008-04-11 ENCOUNTER — Other Ambulatory Visit: Payer: Self-pay | Admitting: Internal Medicine

## 2008-04-11 MED ORDER — GEMFIBROZIL 600 MG TABLET
1.0000 | ORAL_TABLET | Freq: Two times a day (BID) | ORAL | Status: DC
Start: 2008-04-11 — End: 2008-06-14

## 2008-04-11 MED ORDER — LISINOPRIL 20 MG-HYDROCHLOROTHIAZIDE 25 MG TABLET
1.0000 | ORAL_TABLET | Freq: Every day | ORAL | Status: DC
Start: 2008-04-11 — End: 2009-03-12

## 2008-04-11 MED ORDER — EZETIMIBE 10 MG-SIMVASTATIN 40 MG TABLET
1.0000 | ORAL_TABLET | Freq: Every day | ORAL | Status: DC
Start: 2008-04-11 — End: 2009-03-12

## 2008-04-11 MED ORDER — CLOPIDOGREL 75 MG TABLET
75.0000 mg | ORAL_TABLET | Freq: Every day | ORAL | Status: DC
Start: 2008-04-11 — End: 2009-03-12

## 2008-04-11 MED ORDER — ALLOPURINOL 300 MG TABLET
1.0000 | ORAL_TABLET | Freq: Every day | ORAL | Status: DC
Start: 2008-04-11 — End: 2009-03-12

## 2008-04-11 NOTE — Telephone Encounter (Signed)
Patient needs written prescriptions for pick up today for:  Allopurinol, Lisinopril-Hydrochlorothiazide, Vytorin, Gemfibrozil, and Clopidogrel.  He would like 3 months supply with refills and would like to pick up around 1pm today.  Call to advise.    [name]

## 2008-04-11 NOTE — Telephone Encounter (Signed)
Done and given to front desk.  Marckus Hanover M.D.

## 2008-05-17 ENCOUNTER — Ambulatory Visit

## 2008-05-17 ENCOUNTER — Ambulatory Visit: Admitting: Cardiovascular Disease

## 2008-05-17 NOTE — Nursing Note (Signed)
>>   [name]     Fri May 17, 2008 12:41 PM  Vital signs taken, screened for pain, ekg performed, allergies and pharmacy verified. Janna Arch, MAI

## 2008-05-30 NOTE — Progress Notes (Addendum)
May 18, 2008 RE: Joshua, Hensley    MR#: [id number]   DOB: 09/07/1951   Date of Service: 05/17/2008       Woodward Ku, MD  PCN  [facility]    Dear Dr. Felicita Gage:    [name] returned to our Cardiology Clinic for a followup evaluation.     As you recall, he is a 57 year old gentleman with a history of hypertension, hyperlipidemia, and long history of coronary artery disease. In 1995 the patient had ischemic chest pain, and he was found to have significant coronary artery disease treated with percutaneous coronary intervention. The patient has had stents placed in all three coronary arteries. In April 2009, the patient underwent coronary angiography at [facility]. He was found to have moderate disease in the right coronary artery and moderate disease in the mid left anterior descending coronary artery. Both of lesions were interrogated by pressure wire with fractional flow reserve, and the lesions were not felt to be hemodynamically significant. The right coronary artery lesion had an FFR of 0.85, and the LAD had an FFR of 0.80. Left ventricular function was well preserved at 55%.     Since that time, the patient has continued to do well. He denies any chest discomfort of angina pectoris or myocardial infarction. He has no dyspnea, orthopnea, or paroxysmal nocturnal dyspnea. He denies palpitations, dizziness, or syncope.     His medical regimen includes allopurinol 300 mg daily, lisinopril/hydrochlorothiazide 20/25 daily, Lopid 600 mg b.i.d., Plavix 75 mg daily, Vytorin 10/40 daily, sildenafil 50 mg p.r.n., Niaspan 500 mg daily, Protonix 40 mg daily, carvedilol 25 mg b.i.d., and aspirin 5 grains daily.     The patient has not been able to tolerate statins because of elevated liver enzymes and muscle aches.    Physical Exam: Pleasant, overweight male in no acute distress. Blood pressure 132/98 (dropping to 130/90 after five minutes of sitting), weight 238 pounds. Chest: Clear.  Cardiovascular: Jugular venous pressure is 6 cm of water. Carotid pulses normal. S4 gallop at the apex. A 1/6 systolic murmur at the lower left sternal border. Extremities: No edema.     Laboratory: Potassium 3.2, BUN 16, creatinine 0.81. Cholesterol 204, LDL 83, HDL 34. BNP 51. Hs-CRP 0.3. Blood count normal.    Assessment: 1) Myocardial infarction in 1995--Small. Well-preserved cardiac function. 2) Percutaneous coronary intervention in 1995, bare metal stents. 3) Multiple coronary interventions, LAD, left circumflex, and right coronary artery, total of 25 stents. Aspirin and Plavix indefinitely. 4) Moderate CAD, right coronary artery and LAD--FFR greater than 0.80. Continue with medical therapy. Asymptomatic at this time. 5) Hypertension--Not optimal. Will continue to monitor. If pressure is elevated, will add additional medications. 6) Hyperlipidemia--Intolerant of statins. Will switch Lopid to Trilipix 145 mg daily. 7) History of gout--On allopurinol. Check uric acid next visit. 8) Obesity--Weight reduction and exercise.    Plan: 1) Change from Lopid to Trilipix after he completes his present prescription. 2) Exercise and weight reduction. 3) [name]-salt diet. 4) Follow up in six to seven months.    I appreciate this opportunity to continue in the care of [name]. Please contact me if you have any questions regarding his management.    Sincerely,       Kaegan Hettich I Shellia Hartl, MD  PROFESSOR AND CHIEF  DIVISION OF CARDIOVASCULAR MEDICINE  DEPARTMENT OF INTERNAL MEDICINE  THIS WAS ELECTRONICALLY SIGNED - 05/30/2008 2:37 PM PST BY:  [id number]    D: 05/18/2008 02:12 PM  T: 05/18/2008 04:11 PM  C#: [id number]

## 2008-06-14 ENCOUNTER — Telehealth: Payer: Self-pay | Admitting: Cardiovascular Disease

## 2008-06-14 NOTE — Telephone Encounter (Signed)
Received TC from patient.  Patient would like prescription sent for 90 day supply of Trilipix 135 mg.  Discussed with Dr. Rachell Cipro.  Prescription completed and mailed to patient's home address per request.

## 2008-06-27 ENCOUNTER — Other Ambulatory Visit: Payer: Self-pay | Admitting: Cardiovascular Disease

## 2008-06-27 ENCOUNTER — Telehealth: Payer: Self-pay | Admitting: Cardiovascular Disease

## 2008-06-27 NOTE — Telephone Encounter (Signed)
Received TC from patient requesting lab results.  Lab results reviewed with patient.  Mailed copy to house.

## 2008-06-28 ENCOUNTER — Ambulatory Visit

## 2008-07-03 ENCOUNTER — Telehealth: Payer: Self-pay | Admitting: Cardiovascular Disease

## 2008-07-03 NOTE — Telephone Encounter (Signed)
Received TC from patient.  Pharmacy will not release medication until possible drug interaction question is addressed.  TC to pharmacy [phone].  Question regarding Vytorin and Trilipix.  Patient is to take both medications together.  Dr. Rachell Cipro is aware patient is taking both medications.  Pharmacy will send out medication overnight per patient request.  Patient had additional question regarding medical clearance for possible position as [employer].  Dr. Rachell Cipro reviewed medical qualifications regarding coronary artery disease.  Stated patient is currently asymptomatic.  Dr. Rachell Cipro recommends patient proceed with finding out more information.  He suspects that patient will need to undergo medical evaluation by independent physician.  Patient will contact us with any further questions or concerns.

## 2008-07-11 ENCOUNTER — Other Ambulatory Visit: Payer: Self-pay | Admitting: Internal Medicine

## 2008-07-11 NOTE — Telephone Encounter (Signed)
Patient called requesting a written script wuith refills for medication: Protonix to be mailed to his home. Patient requested a 90 day supply = quanity #[phone]. Please send written script as requested. Thanks,Tawny Carbahal ma I

## 2008-07-12 ENCOUNTER — Other Ambulatory Visit: Payer: Self-pay | Admitting: Internal Medicine

## 2008-07-12 MED ORDER — ALPRAZOLAM 0.25 MG TABLET
1.0000 | ORAL_TABLET | Freq: Three times a day (TID) | ORAL | Status: DC | PRN
Start: 2008-07-12 — End: 2008-10-22

## 2008-07-15 MED ORDER — PANTOPRAZOLE 40 MG TABLET,DELAYED RELEASE
1.0000 | DELAYED_RELEASE_TABLET | Freq: Two times a day (BID) | ORAL | Status: DC
Start: 2008-07-11 — End: 2009-03-12

## 2008-07-15 NOTE — Telephone Encounter (Signed)
Rx printed, please mail to home

## 2008-07-16 ENCOUNTER — Other Ambulatory Visit: Payer: Self-pay | Admitting: Internal Medicine

## 2008-07-16 NOTE — Telephone Encounter (Signed)
Will hold for PCP

## 2008-07-17 MED ORDER — SILDENAFIL 50 MG TABLET
ORAL_TABLET | ORAL | Status: DC
Start: 2008-07-16 — End: 2009-03-12

## 2008-08-06 ENCOUNTER — Ambulatory Visit

## 2008-08-06 ENCOUNTER — Encounter

## 2008-08-07 ENCOUNTER — Encounter: Payer: Self-pay | Admitting: Internal Medicine

## 2008-08-07 ENCOUNTER — Ambulatory Visit: Admitting: Internal Medicine

## 2008-08-07 VITALS — BP 126/85 | HR 113 | Temp 97.7°F | Resp 18 | Ht 71.0 in | Wt 245.1 lb

## 2008-08-07 NOTE — Progress Notes (Signed)
SUBJECTIVE  [name] is a 57yr old male who presents for a health maintenance physical examination. Current concerns include: DMV physical as well.    In general he has been feeling well and functioning well at home, work, and personal relationships., dealing with a lot of work stress or dealing with a lot of family stress.  His DMV physical exam form (DL51) was reviewed and all positive responses on his Health History were addressed.    Recent follow up with Dr Rana Snare. Clinically stable. Denies increased anginal symptoms or concerns. Reports compliance with medications.   The patient denies chest pain, dyspnea, edema, or TIA's.       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    FAMILY HX GI MALIGNANCY V16.0    GOUT UNSPECIFIED 274.9    BENIGN ESSENTIAL HYPERTENSION 401.1    OTHER AND UNSPECIFIED HYPERLIPIDEMIA 272.4    ANXIETY STATE UNSPECIFIED 300.00    Gout 274.9E       Family History   Problem Relation    Hypertension Brother    Cancer Mother     colon, 58's       History   Social History    Marital Status: MARRIED     Spouse Name: N/A     Number of Children: N/A    Years of Education: N/A   Social History Main Topics    Tobacco Use: Quit -- 2.0 packs/day for 10 years      quit at age 57    Alcohol Use: Yes      3-4 drinks per day; wine or vodka or both    Drug Use: Not on file    Sexually Active: Yes -- Male partner(s)   Other Topics Concern    Not on file   Social History Narrative    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    Review of Systems -   General: no tiredness, weight loss or gain, or changes in appetite.  Head: no headaches, dizziness or syncope.  Eyes: no visual changes.  Ears: no hearing loss.  Lungs: no prolonged cough or dyspnea.  CV: no chest pain or palpitations.  GI: no abdominal pain, change in bowel habits, black or bloody stools.  Urinary: no dysuria, urgency or hematuria.  Musculoskeletal: no new symptoms.  Genital: no genital lesions.        OBJECTIVE  BP 126/85  Pulse 113  Temp(Src) 36.5 C (97.7 F) (Tympanic)  Resp 18  Ht 1.803 m (5\' 11" )  Wt 111.177 kg (245 lb 1.6 oz)   He appears well, in no apparent distress.  Alert and oriented, pleasant and cooperative.  Eye exam is normal; sclera nonicteric, conjuctiva not injected.  Ear exam shows normal external ear canals and TM's.   Oropharynx is clear without lesions or exudate.   Neck is supple and free  of adenopathy or masses, the thyroid is normal without enlargement, palpable nodules or tenderness. No carotid bruits.   The neck is supple and free of adenopathy or masses, the thyroid is normal without enlargement or nodules.   Chest is clear to auscultation bilaterally. No wheezing, crackles or rhonchi. Normal symmetric air entry throughout both lung fields. No chest wall tenderness.   Heart exam shows regular rate and rhythm. No murmurs, rubs or gallops. No edema or JVD.   The abdomen is soft, nontender, no rebound or guarding, no palpable masses and no organomegaly noted. Bowel sounds are normal. No CVA tenderness noted.   I note only benign skin findings. No unusual rashes or suspicious skin lesions noted. Nails appear normal.   Extremities: joints unremarkable,  no deformities, cyanosis, clubbing or edema.   GENITAL: Penis normal. No urethral discharge. Scrotum normal to palpation. No hernia.   RECTAL: rectal normal, no masses, prostate normal in size without masses or  nodules.      Impression/Plan:      V70.0 Routine General Medical Examination at a Health Care Facility  (primary encounter diagnosis)  Comment: normal exam; health maintenance up to date   Qualified for commercial drivers license  DMV forms completed, see copy on file.  Plan: follow up as needed     V76.44 Special Screening for Malignant Neoplasm of Prostate  Comment:   Plan: PSA SCREEN             Barriers to Learning assessed: none. Patient verbalizes understanding of teaching and instructions.    Deltha Bernales M.D.

## 2008-08-07 NOTE — Nursing Note (Signed)
>>   [name]     Wed Aug 07, 2008  3:04 PM  Vital signs taken, allergies verified, screened for pain, med hx taken,  screened for chicken pox, and verified immunization status.  [name], MA

## 2008-08-19 ENCOUNTER — Ambulatory Visit

## 2008-08-19 NOTE — Nursing Note (Signed)
>>   Wynona Luna, LVN, LVN     Mon Aug 19, 2008 10:21 AM  PPD placed and follow up instructions given to return in 48 to 72 hours for read.    1.  Reason for TB screening test? employment  2.  Have you ever had a positive reaction to a TB test?  no  3.  Have you had a TB test in the past year? no   4.  Have you ever been diagnosed or treated for TB?  no  Wynona Luna, LVN

## 2008-08-20 ENCOUNTER — Encounter

## 2008-09-12 ENCOUNTER — Ambulatory Visit

## 2008-09-16 ENCOUNTER — Telehealth: Payer: Self-pay | Admitting: Cardiovascular Disease

## 2008-09-16 NOTE — Telephone Encounter (Addendum)
Received Email from patient requesting recent lipid panel results.  Discussed results with Dr. Rachell Cipro.  Patient lipid panel remains similar to last, continue present regimen.  TC to patient to discuss.  Discussed values with patient, advised that triglycerides remain close to previous value.  Dr. Rachell Cipro recommends patient remain on current medication recheck prior to next appointment.  Patient agrees.

## 2008-09-18 ENCOUNTER — Ambulatory Visit

## 2008-09-18 NOTE — Nursing Note (Signed)
>>   HEATHER SAGE, MA     Wed Sep 18, 2008 11:35 AM  The current VIS document was given to the patient to review and any questions they had were answered or directed to the physician.The H1N1 Influenza Vaccine was then administered per protocol and the patient was observed for immediate reactions to the vaccine.  None were observed.    Raelene Bott, MA

## 2008-10-04 HISTORY — PX: PR APPENDECTOMY: 44950

## 2008-10-15 ENCOUNTER — Ambulatory Visit: Admitting: Family Medicine

## 2008-10-15 ENCOUNTER — Telehealth: Payer: Self-pay | Admitting: Internal Medicine

## 2008-10-15 ENCOUNTER — Encounter: Payer: Self-pay | Admitting: Family Medicine

## 2008-10-15 ENCOUNTER — Ambulatory Visit

## 2008-10-15 ENCOUNTER — Encounter: Payer: Self-pay | Admitting: Internal Medicine

## 2008-10-15 VITALS — BP 124/82 | HR 100 | Temp 98.1°F | Resp 12 | Wt 251.0 lb

## 2008-10-15 MED ORDER — HYDROCODONE 5 MG-ACETAMINOPHEN 500 MG TABLET
1.0000 | ORAL_TABLET | Freq: Three times a day (TID) | ORAL | Status: DC | PRN
Start: 2008-10-15 — End: 2008-10-25

## 2008-10-15 NOTE — Patient Instructions (Signed)
Use ice to the area as needed , then ES tylenol.
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
## 2008-10-15 NOTE — Telephone Encounter (Signed)
Letter printed, please fax

## 2008-10-15 NOTE — Telephone Encounter (Signed)
Patient is requesting a letter be faxed to the Parking people at the med center.  The letter must state that patient needs assistance with parking until [facility] approves parking placard.  Please fax letter to [employer] [phone].  Here DMV paperwork has not been approved yet.      Thank You,  [name]

## 2008-10-16 ENCOUNTER — Encounter: Payer: Self-pay | Admitting: Family Medicine

## 2008-10-16 NOTE — Telephone Encounter (Signed)
From: Giacobbe,Ellijah   To: Brantley Stage   Sent: Wed Oct 16, 2008 7:20 AM   Subject: Non-urgent Medical Advice Question    Dr. Hessie Diener,  I am now connected and glad you wrote that Rx...I had a miserable night...will pick it up this afternoon and will check back in for x-ray reults then. Thank you.  [name]

## 2008-10-16 NOTE — Telephone Encounter (Signed)
The letter was faxed to the number below. Bryant Lester, MAII

## 2008-10-22 ENCOUNTER — Other Ambulatory Visit: Payer: Self-pay | Admitting: Internal Medicine

## 2008-10-23 MED ORDER — ALPRAZOLAM 0.25 MG TABLET
1.0000 | ORAL_TABLET | Freq: Three times a day (TID) | ORAL | Status: DC | PRN
Start: 2008-10-22 — End: 2009-06-06

## 2008-10-25 ENCOUNTER — Encounter

## 2008-10-25 ENCOUNTER — Ambulatory Visit: Admitting: Internal Medicine

## 2008-10-25 ENCOUNTER — Telehealth: Payer: Self-pay | Admitting: Internal Medicine

## 2008-10-25 ENCOUNTER — Encounter: Admitting: Internal Medicine

## 2008-10-25 VITALS — BP 128/80 | HR 80 | Temp 98.4°F | Resp 12 | Wt 249.7 lb

## 2008-10-25 MED ORDER — METRONIDAZOLE 500 MG TABLET
500.0000 mg | ORAL_TABLET | Freq: Two times a day (BID) | ORAL | Status: AC
Start: 2008-10-25 — End: 2008-11-25

## 2008-10-25 MED ORDER — LEVOFLOXACIN 750 MG TABLET
750.0000 mg | ORAL_TABLET | Freq: Every day | ORAL | Status: AC
Start: 2008-10-25 — End: 2008-11-04

## 2008-10-25 MED ORDER — HYDROCODONE 5 MG-ACETAMINOPHEN 500 MG TABLET
1.0000 | ORAL_TABLET | Freq: Four times a day (QID) | ORAL | Status: AC | PRN
Start: 2008-10-25 — End: 2008-11-24

## 2008-10-25 NOTE — Progress Notes (Signed)
SUBJECTIVE: [name] is a 58yr old male who presents for a new problem of mid to lower abdominal pain for the past 2 day(s).  The pain is increasing since onset, and is described as cramping without radiation.  He has associated initial diarrhea and then constipation, but denies nausea, vomiting, melena, hematochezia, dysuria, frequency, hematuria, fever and chills.  The pain is exacerbated by pressure and eating and is relieved by nothing in particular.  History of pancreatitis several years ago and concerned about recurrent episode. Drank much more than usual over the weekend due to visiting family member.    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     Objective:  BP 128/80  Pulse 80  Temp(Src) 36.9 C (98.4 F) (Oral)  Resp 12  Wt 113.263 kg (249 lb 11.2 oz)  He appears well, in no apparent acute distress but complaining of moderately severe discomfort.   Alert and oriented, pleasant and cooperative.   Eye exam is normal; sclera nonicteric, conjuctiva not injected.   Oropharynx is clear without lesions or exudate.   Neck is supple and free of adenopathy or masses, the thyroid is normal without enlargement, palpable nodules or tenderness.   Chest is clear to auscultation bilaterally. No wheezing, crackles or rhonchi. Normal symmetric air entry throughout both lung fields. No chest wall tenderness.  Heart exam shows regular rate and rhythm. No  murmurs, rubs or gallops. No edema or JVD.  Abdomen:no scars, no striae, no dilated veins, no rashes and no lesions, no masses palpable, no organomegaly, moderate mid abdominal tenderness, above the umbilicus, mild epigastric tenderness to palpation  and hypoactive bowel sounds;no rebound or guarding, no CVA tenderness.  Extremities: no cyanosis, clubbing or edema.  Skin:No unusual rashes or suspicious skin lesions noted.  Mental status exam: he is alert, oriented to time, person and place. Normal thought content, speech and grooming are noted.       Impression/Plan:      789.00 Abdominal Pain, Unspecified Site  (primary encounter diagnosis)  Comment: clinically suspect diverticulitis rather than pancreatitis  Pain improved with demerol/phenergan  Discussed clear liquid diet for next 48 hours, monitor for fever, nausea or vomiting or worsening pain  Go to ER over the weekend if symptoms severe, will need CT of abdomen   Over the counter stool softener as needed  Limit use of pain medications if possible due to constipation and masking symptoms   Phone follow up on Monday, follow up sooner as needed   Plan: CBC AUTO + REFLEX MANUAL DIFF, COMPREHENSIVE         METABOLIC PANEL, LIPASE, AMYLASE, MEPERIDINE         INJ CLINIC, PROMETHAZINE INJ CLINIC,  LEVOFLOXACIN 750 MG TAB, METRONIDAZOLE 500 MG         TAB, HYDROCODONE-ACETAMINOPHEN 5 MG-500 MG TAB              Barriers to Learning assessed: none. Patient verbalizes understanding of teaching and instructions.    Esther Bradstreet M.D.

## 2008-10-25 NOTE — Telephone Encounter (Signed)
Made a appointment for patient to see Dr.  Felicita Gage today. [name], MA

## 2008-10-25 NOTE — Nursing Note (Signed)
>>   [name]     Fri Oct 25, 2008  4:54 PM  Per orders of Dr. Felicita Gage, injection of MEPERIDINE 50 MG given by [name]. Patient instructed to remain in clinic for 20 minutes afterwards, and to report any adverse reaction to me immediately.   PATIENT PAIN WAS AT 7 OUT OF 10 BEFORE INJECTIONS AFTER INJECTIONS PATIENT PAIN WAS A 3 OUT OF 10. DR KNOEPFLER DISCHARGED PATIENT AFTER 20 MINS. PATIENT WAS DRIVEN HOME BY WIFE. [name], MA          Per orders of Dr. Felicita Gage, injection of PROMETHAZINE 25 MG given by [name]. Patient instructed to remain in clinic for 20 minutes afterwards, and to report any adverse reaction to me immediately.   PATIENT PAIN WAS AT 7 OUT OF 10 BEFORE INJECTIONS AFTER INJECTIONS PATIENT PAIN WAS A 3 OUT OF 10. DR KNOEPFLER DISCHARGED PATIENT AFTER 20 MINS. PATIENT WAS DRIVEN HOME BY WIFE. [name], MA             >> [name]     Fri Oct 25, 2008  3:03 PM  Vital signs taken, allergies verified, screened for pain, med hx taken,  screened for chicken pox, and verified immunization status.  [name], MA  Mayra H.MA

## 2008-10-25 NOTE — Telephone Encounter (Signed)
Patient states it feels like he has a rock in his stomach. Last night he couldn't sleep. Pain level 7 of 10. Diarrhea for 2 days. No fever.chills and cold sweats. Says he had an pancreatitis 5 years ago and feels its the same thing now. Please advise. No apppts left in clinic  Thank you,  Amy Stilwell, [facility] II

## 2008-10-28 ENCOUNTER — Telehealth: Payer: Self-pay | Admitting: Internal Medicine

## 2008-10-28 NOTE — Telephone Encounter (Signed)
10:07 AM called patient back. No answer. Left message will send labs and message via MyChart and to contact me back for questions.  Jaxzen Vanhorn M.D.

## 2008-10-28 NOTE — Telephone Encounter (Signed)
He is calling for the results of lab test done at [facility] 10/25/08.   He would like a phone call back and he states that he has not eaten and would really like a call back to inform him of the results.

## 2008-11-25 ENCOUNTER — Other Ambulatory Visit: Payer: Self-pay | Admitting: Internal Medicine

## 2008-11-25 MED ORDER — FLUTICASONE PROPIONATE 50 MCG/ACTUATION NASAL SPRAY,SUSPENSION
2.0000 | Freq: Every day | NASAL | Status: AC
Start: 2008-11-25 — End: 2009-11-25

## 2008-11-25 NOTE — Telephone Encounter (Signed)
Prescription Refills   Pending Prescriptions Disp Refills    FLUTICASONE 50 MCG/ACTUATION NASAL SPRAY, SUSP 16 g 5     Sig: inhale 2 sprays in each nostril by nasal route once daily       Patient has been instructed to call the pharmacy in 24 hours to check the status of this request.

## 2008-12-11 ENCOUNTER — Ambulatory Visit

## 2008-12-17 ENCOUNTER — Encounter

## 2008-12-17 ENCOUNTER — Ambulatory Visit

## 2008-12-20 ENCOUNTER — Ambulatory Visit: Admitting: Cardiovascular Disease

## 2008-12-20 NOTE — Nursing Note (Signed)
>>   MARIA [facility]     Fri Dec 20, 2008  1:37 PM  Vitals signs taken,screened for pain,allergies verified, pharmacy verified, EKG done. Baxter Kail, [place]

## 2008-12-24 ENCOUNTER — Encounter: Admitting: Cardiovascular Disease

## 2009-01-16 ENCOUNTER — Encounter: Payer: Self-pay | Admitting: Internal Medicine

## 2009-01-16 NOTE — Telephone Encounter (Signed)
From: Protzman,Westyn   To: Aurelio Jew   Sent: Wed Jan 15, 2009 5:11 PM   Subject: Non-urgent Medical Advice Question    Hi Dr. Felicita Gage,    I have been on a 'very low carbohydrate diet for 22 days and have lost 27 pounds. It is doctor supervised and today my BP was 80/42 (avg of 2 readings)....obviously not good. They suggested I stop my meds but I told them they have a dual function in that they also protect my heart. Yes, I am light headed and looking for advice on what to do. I will not take my second Carvedilol tonight.  Many thanks,  [name]

## 2009-01-17 ENCOUNTER — Ambulatory Visit

## 2009-01-17 NOTE — Nursing Note (Signed)
>>   Wynona Luna, LVN     Fri Jan 17, 2009  2:51 PM  Blood pressure check and pulse done.  Medications reviewed.  Patient has lost 27 lbs in 3 weeks with a weight management program.  Was told his blood pressure was low at his last visit 80/40.  Patient states he had spoken to Dr Felicita Gage regarding this and was told to cut his Lisinopril is half.  Tablet is not scored and patient is unable to cut in half, continues to take 1 daily.  Wynona Luna, LVN

## 2009-01-19 MED ORDER — LISINOPRIL 10 MG-HYDROCHLOROTHIAZIDE 12.5 MG TABLET
1.0000 | ORAL_TABLET | Freq: Every day | ORAL | Status: DC
Start: 2009-01-19 — End: 2009-07-02

## 2009-01-19 NOTE — Progress Notes (Addendum)
Addended by: Aurelio Jew on: 01/19/2009      Modules accepted: Orders

## 2009-01-19 NOTE — Progress Notes (Addendum)
Please call patient and advise that I sent in new prescription for lower dose lisinopril since unable to cut in half. Sent it to local pharmacy so he can try it and see if blood pressure ok on this dose before we send it to mail order. Would follow up in the office in about 3 weeks on this dose as well.   Jenafer Winterton M.D.

## 2009-03-11 ENCOUNTER — Encounter: Payer: Self-pay | Admitting: Internal Medicine

## 2009-03-11 NOTE — Telephone Encounter (Signed)
From: Peregrina,Garhett   To: Aurelio Jew   Sent: Tue Mar 11, 2009 10:36 AM   Subject: Non-urgent Medical Advice Question    Hi Dr. Felicita Gage,  I need new prescriptions written for all my meds....these would be 90 day supplies with so many renewals (5?) to send to [facility] home delivery. Please mail them to [address], [place] [address]. I would also like to get another RX for Viagra [facility] [place] [phone]. Many thanks.  [name]

## 2009-03-12 ENCOUNTER — Other Ambulatory Visit: Payer: Self-pay | Admitting: Internal Medicine

## 2009-03-12 ENCOUNTER — Encounter: Payer: Self-pay | Admitting: Internal Medicine

## 2009-03-12 MED ORDER — LISINOPRIL 20 MG-HYDROCHLOROTHIAZIDE 25 MG TABLET
1.0000 | ORAL_TABLET | Freq: Every day | ORAL | Status: DC
Start: 2009-03-12 — End: 2009-11-03

## 2009-03-12 MED ORDER — EZETIMIBE 10 MG-SIMVASTATIN 40 MG TABLET
1.0000 | ORAL_TABLET | Freq: Every day | ORAL | Status: DC
Start: 2009-03-12 — End: 2009-12-24

## 2009-03-12 MED ORDER — FENOFIBRIC ACID (CHOLINE) 135 MG CAPSULE,DELAYED RELEASE
1.0000 | DELAYED_RELEASE_CAPSULE | Freq: Every day | ORAL | Status: DC
Start: 2009-03-12 — End: 2009-12-24

## 2009-03-12 MED ORDER — SILDENAFIL 50 MG TABLET
ORAL_TABLET | ORAL | Status: AC
Start: 2009-03-12 — End: 2010-03-12

## 2009-03-12 MED ORDER — NIACIN ER 500 MG TABLET,EXTENDED RELEASE 24 HR
1.0000 | EXTENDED_RELEASE_TABLET | Freq: Every day | ORAL | Status: DC
Start: 2009-03-12 — End: 2009-07-30

## 2009-03-12 MED ORDER — CARVEDILOL 25 MG TABLET
1.0000 | ORAL_TABLET | Freq: Two times a day (BID) | ORAL | Status: DC
Start: 2009-03-12 — End: 2009-11-10

## 2009-03-12 MED ORDER — CLOPIDOGREL 75 MG TABLET
75.0000 mg | ORAL_TABLET | Freq: Every day | ORAL | Status: DC
Start: 2009-03-12 — End: 2009-12-24

## 2009-03-12 MED ORDER — PANTOPRAZOLE 40 MG TABLET,DELAYED RELEASE
1.0000 | DELAYED_RELEASE_TABLET | Freq: Two times a day (BID) | ORAL | Status: DC
Start: 2009-03-12 — End: 2010-04-08

## 2009-03-12 MED ORDER — ALLOPURINOL 300 MG TABLET
1.0000 | ORAL_TABLET | Freq: Every day | ORAL | Status: DC
Start: 2009-03-12 — End: 2010-04-08

## 2009-03-12 NOTE — Telephone Encounter (Signed)
From: Prezioso,Artemis   To: Aurelio Jew   Sent: Tue Mar 11, 2009 6:26 PM   Subject: Non-urgent Medical Advice Question    Please let me know why you do not know my list of medications.Marland KitchenMarland KitchenMarland Kitchen

## 2009-04-23 ENCOUNTER — Telehealth: Payer: Self-pay | Admitting: Internal Medicine

## 2009-04-23 NOTE — Telephone Encounter (Signed)
Spoke with patient wife this medication is for patient wife not this patient . This message does not go with this patient i think this was done error.    [name]  MA I

## 2009-04-23 NOTE — Telephone Encounter (Signed)
She is calling to get a diagnosis for the Nexium that was ordered by Dr. Felicita Gage.    Do not see an order for this medication in chart.    Please call her back for questions or diagnosis.

## 2009-06-03 ENCOUNTER — Encounter: Payer: Self-pay | Admitting: Internal Medicine

## 2009-06-03 NOTE — Telephone Encounter (Signed)
From: Herdt,Deaire   To: Aurelio Jew, MD   Sent: Tue Jun 03, 2009 9:36 AM   Subject: Non-urgent Medical Advice Question    Dr. Felicita Gage,  These pain killers work too well Alvester Chou)....the [facility]am [facility] [place]....Marland Kitchenonce again I apologize :(  [name]

## 2009-06-03 NOTE — Telephone Encounter (Signed)
From: Hensley,Joshua   To: Aurelio Jew, MD   Sent: Tue Jun 03, 2009 6:48 AM   Subject: Non-urgent Medical Advice Question    Hello Dr. Felicita Gage,  On Saturday morning I went in for emergency surgery--a ruptured appendix...spent 3 days at [facility]..Dr. Bryon Lions [address]...he advised me to get an ultrasound on my gall bladder...something is going on...thick walls? Can you advise please?  I also need refills on Trilipex 135 mg #90 1 daily(for mail in) and Alprazolam 0.25 mg tablet 1 tablet by mouth 3 times/day as needed.  I hope all is well with you and thank you!    [name]

## 2009-06-04 ENCOUNTER — Encounter: Payer: Self-pay | Admitting: Internal Medicine

## 2009-06-04 NOTE — Telephone Encounter (Signed)
From: Engelmann,Kelly   To: Aurelio Jew, MD   Sent: Wed Jun 04, 2009 2:43 AM   Subject: Non-urgent Medical Advice Question    [name],  I need a refill for alprazolam 0.25 mg 1 tablet 3 times/day as needed...[facility] [place] I would appreciate it.  I sent another message to Dr. Felicita Gage about my emergency surgery on Saturday.Marland KitchenMarland KitchenI had a ruptured appendix and spent three days at [facility]. Dr Bryon Lions did the surgery and suggested strongly that I get an ultrasound done on my gall bladder. He said there is something going on there....thick walls?? Anyway can you please advise on this. Thank you.  [name]

## 2009-06-04 NOTE — Telephone Encounter (Signed)
Please obtain records from [facility] and get prescription refills ordered.  Zafir Schauer M.D.

## 2009-06-06 ENCOUNTER — Other Ambulatory Visit: Payer: Self-pay | Admitting: Internal Medicine

## 2009-06-06 MED ORDER — ALPRAZOLAM 0.25 MG TABLET
1.0000 | ORAL_TABLET | Freq: Three times a day (TID) | ORAL | Status: DC | PRN
Start: 2009-06-06 — End: 2009-11-10

## 2009-06-11 NOTE — Telephone Encounter (Addendum)
Did we get the records from [facility]? I have not seen them.  Dee Maday M.D.

## 2009-06-13 ENCOUNTER — Ambulatory Visit

## 2009-06-16 NOTE — Telephone Encounter (Addendum)
Addended by: Aurelio Jew on: 06/16/2009      Modules accepted: Orders

## 2009-06-25 ENCOUNTER — Telehealth: Payer: Self-pay | Admitting: Internal Medicine

## 2009-06-25 ENCOUNTER — Ambulatory Visit

## 2009-06-25 NOTE — Telephone Encounter (Signed)
Patient sent a my chart message and advised that he will make appointment.   [name]  MA I

## 2009-07-02 ENCOUNTER — Ambulatory Visit: Admitting: Internal Medicine

## 2009-07-02 ENCOUNTER — Encounter: Payer: Self-pay | Admitting: Internal Medicine

## 2009-07-02 VITALS — BP 110/73 | HR 87 | Temp 97.1°F | Resp 18 | Wt 210.2 lb

## 2009-07-02 DIAGNOSIS — K3580 Unspecified acute appendicitis: Secondary | ICD-10-CM

## 2009-07-02 HISTORY — DX: Unspecified acute appendicitis: K35.80

## 2009-07-02 NOTE — Progress Notes (Signed)
[name] is a 58yr old male presents for follow-up since emergency appendectomy, ruptured appendix. Surgery done at [facility]. Records reviewed. Treated with antibiotics, surgical of medications of postop hematoma, initial concerns of possible localized abscess, symptoms resolving post antibiotics. Concern of possible underlying gallbladder disorder, prior history of pancreatitis. Abdominal ultrasound done recently only notable for postop hematoma/fluid collection in the right lower quadrant, gallbladder contracted but otherwise no evidence of gallstones or intra-or extrahepatic biliary tract dilatation, common bile duct was normal. Denies any significant abdominal pain, nausea, vomiting, change in bowel movements, postprandial symptoms. No fever or chills, right lower quadrant discomfort resolving. Otherwise feels at baseline.    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  Alert and oriented, pleasant and cooperative.  Chest is clear to auscultation bilaterally. No wheezing, crackles or rhonchi. Normal symmetric air entry throughout both lung fields. No chest wall tenderness.  Heart exam shows regular rate and rhythm.  The abdomen is soft, minimally tender right lower quadrant, otherwise nontender, small  palpable firm mass in right lower quadrant, no rebound or guarding, no palpable masses and no organomegaly noted. Bowel sounds are normal. No CVA tenderness noted.      Impression/Plan:      540.9H Appendicitis, acute  (primary encounter diagnosis)  Comment: outside records reviewed in detail with patient, continue clinical follow-up, follow-up CBC  Plan: CBC AUTO + REFLEX MANUAL DIFF            414.9 CHRONIC ISCHEMIC HEART DISEASE UNSPECIFIED  Comment: clinically stable, has not been using niacin but otherwise compliant medications, recommend update labs prior to his cardiology visit  Plan: LIPID PANEL WITH DLDL REFLEX            272.4 Other and unspecified hyperlipidemia  Comment: continue current management, update labs  Plan: LIPID PANEL WITH DLDL REFLEX            Discussed we'll not pursue further gallbladder imaging or high density in unless he has symptoms consistent with pancreatitis or symptoms suggestive of cholecystitis    I spent with the patient, 15 minutes of which were spent counseling and coordinating care as noted above.        Barriers to Learning assessed: none. Patient verbalizes understanding of teaching and instructions.    Electronically Signed By:     Woodward Ku M.D.  Attending Physician  [facility]

## 2009-07-02 NOTE — Nursing Note (Signed)
>>   [name]     Wed Jul 02, 2009  9:38 AM  Vital signs taken, allergies verified, screened for pain, med hx taken,  screened for chicken pox, and verified immunization status.  [name], MA

## 2009-07-02 NOTE — Patient Instructions (Signed)
PLEASE STOP AT THE FRONT DESK TO SCHEDULE A LAB APPOINTMENT for new lab tests that have been ordered. PLEASE DO THESE TESTS WITHIN 2-3 WEEKS, after a 12 hour fast (nothing to eat or drink except water). The lab tests have been ordered through the computer, no paper lab slip is needed. The lab is open from 8:00 AM to 4:15 Monday to Friday.  The lab is located on the first floor of the clinic building in the waiting area for [address]. The lab results will be sent to you by mail in approximately 2 weeks.

## 2009-07-14 ENCOUNTER — Ambulatory Visit

## 2009-07-14 ENCOUNTER — Encounter: Payer: Self-pay | Admitting: Internal Medicine

## 2009-07-14 ENCOUNTER — Encounter

## 2009-07-14 NOTE — Telephone Encounter (Signed)
From: Geisel,Daelin   To: Aurelio Jew, MD   Sent: Mon Jul 14, 2009 11:32 AM   Subject: Non-urgent Medical Advice Question    Dr. Felicita Gage,  I had my blood lab done at 7:45 this morning.  [name]

## 2009-07-22 ENCOUNTER — Encounter: Payer: Self-pay | Admitting: Internal Medicine

## 2009-07-22 NOTE — Telephone Encounter (Signed)
From: Harville,Shawnte   To: Aurelio Jew, MD   Sent: Tue Jul 22, 2009 11:49 AM   Subject: Non-urgent Medical Advice Question    Hi Dr. Felicita Gage,  I am wondering if you have had a chance to review my blood test results? The blood was drawn on 10/11. Thank you.    [name]

## 2009-07-25 ENCOUNTER — Telehealth: Payer: Self-pay | Admitting: Internal Medicine

## 2009-07-25 NOTE — Telephone Encounter (Signed)
Ok to come in next week but it would be helpful for him to bring in a stool specimen before that if possible.  Orders placed for stool studies.  Birdie Fetty M.D.

## 2009-07-25 NOTE — Telephone Encounter (Signed)
Patient was advised of Dr. Felicita Gage message. Made a appointment for patient .  [name]  MA I

## 2009-07-25 NOTE — Telephone Encounter (Signed)
Patient complains of diarrhea.    Symptoms present x 5 days.    Patient states his wife spoke to Dr. Felicita Gage today about his symptoms and recommended he come in for an appointment.    He states he does not feel he needs to be seen today.  Please advise when patient to be seen - next available currently is 08/06/09.    Thanks,    Sharene Butters  MOSC II  UCDMGEarlene Plater

## 2009-07-30 ENCOUNTER — Ambulatory Visit: Admitting: Internal Medicine

## 2009-07-30 VITALS — BP 110/69 | HR 94 | Temp 97.8°F | Resp 18 | Wt 215.9 lb

## 2009-07-30 DIAGNOSIS — R7301 Impaired fasting glucose: Secondary | ICD-10-CM | POA: Insufficient documentation

## 2009-07-30 NOTE — Progress Notes (Signed)
[name] is a 58yr old male presents for follow up diarrhea. Duration 5 days. No blood. Watery, loose. Recent appendectomy, post op hematoma, antibiotics. No fever and chills. Some initial abdominal pain but improved. Now with no fever and chills, abdominal pain. Took imodium. Symptoms resolved. Soft stools, 3/day but no other new GI symptoms.     Also wanted to review recent labs. Stopped niacin. Otherwise compliant with medications. No new medication side effects.     The patient denies chest pain, dyspnea, edema, or TIA's. Known history of CAD. Followed by Cardiology.     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  Alert and oriented, pleasant and cooperative.  Heart exam shows regular rate and rhythm. No murmurs, rubs or gallops. No edema   The abdomen is soft, nontender, no rebound or guarding, no palpable masses and no organomegaly noted. Bowel sounds are normal. No CVA tenderness noted.    Recent labs reviewed in detail with patient. Liver enzymes normal. Lipids improved TG and HDL but slightly increased LDL. Not at goal of less than 70 at this time.      Impression/Plan:      787.91 Diarrhea  (primary encounter diagnosis)  Comment: stool studies negative so far; symptoms resolved   Plan:  clinical follow up   Follow up as needed     401.1 BENIGN ESSENTIAL HYPERTENSION  Comment: controlled   Plan: continue management     272.4 Other and unspecified hyperlipidemia  Comment: improved overall; may need to change to crestor if LDL still suboptimal on follow up labs   Plan: follow up 3-6 months     414.9 CHRONIC ISCHEMIC HEART DISEASE UNSPECIFIED  Comment: stable clinically   Plan: continue management, follow up with Cardiology as planned     790.21A Impaired fasting blood sugar  Mildly increased fasting glucose, Hemoglobin A1C   Discussed with patient  Decrease alcohol, increase exercise, low carb diet, weight loss  Periodic monitoring, every 6 months   Follow up sooner as needed         Barriers to Learning assessed: none. Patient verbalizes understanding of teaching and instructions.    Electronically Signed By:     Woodward Ku M.D.  Attending Physician  [facility]

## 2009-07-30 NOTE — Nursing Note (Signed)
>>   STEFANIE MURPHY     Wed Jul 30, 2009  4:05 PM  Vital signs taken, allergies verified, screened for pain, med hx taken,  screened for chicken pox, and verified immunization status.  [name], MA

## 2009-08-01 ENCOUNTER — Telehealth: Payer: Self-pay | Admitting: Internal Medicine

## 2009-08-01 MED ORDER — COLCHICINE 0.6 MG TABLET
ORAL_TABLET | ORAL | Status: DC
Start: 2009-08-01 — End: 2009-09-03

## 2009-08-01 MED ORDER — INDOMETHACIN 25 MG CAPSULE
25.0000 mg | ORAL_CAPSULE | Freq: Three times a day (TID) | ORAL | Status: DC | PRN
Start: 2009-08-01 — End: 2009-11-10

## 2009-08-01 NOTE — Telephone Encounter (Signed)
Called patient he was advised of Dr. Knoepfler message.   Stefanie Murphy  MA I

## 2009-08-01 NOTE — Telephone Encounter (Signed)
Please advise patient medication for both indomethacin and colchicine sent.  Colchicine can cause diarrhea so monitor for GI side effects closely.  Follow up in the office if symptoms not improving as anticipated, significant medication side effects.  Kaliah Haddaway M.D.

## 2009-08-01 NOTE — Telephone Encounter (Signed)
Dr. Felicita Gage patient sent this a my chart message.  [name]  MA I           Hi Dr. Felicita Gage,  I cannot believe this BUT I have an acute attack of gout in my right foot! I woke up yesterday barely able to hobble and it got much worse as the day went on. I have not had this in over 20 years! I am guessing it's the high protein diet I have been on for 7 months---too much red meat. Can you please call in Rx's for Indocin (anti-inflamatory) if they still make it and col[facility]icine [facility] [place]. Thank you very much.  Regards,  [name]

## 2009-08-15 ENCOUNTER — Ambulatory Visit

## 2009-08-15 ENCOUNTER — Encounter: Payer: Self-pay | Admitting: Internal Medicine

## 2009-08-15 NOTE — Telephone Encounter (Signed)
From: Hensley,Joshua   To: Aurelio Jew, MD   Sent: Caleen Essex Aug 15, 2009 9:23 AM   Subject: Non-urgent Medical Advice Question    Hi Dr. Felicita Gage,  I am still suffering with the gout although a lot less severe than my inital acute attack. Some days my foot feels much better and other days like today I can barely walk.  I am wondering if they make a stronger dose of indomethacin.Marland KitchenMarland KitchenI take 2 in the morning and 2 at night.....should I just take more? I also take 2-3 colchicine every day.  Thank you    [name]

## 2009-08-20 ENCOUNTER — Encounter

## 2009-08-20 ENCOUNTER — Encounter: Admitting: Internal Medicine

## 2009-08-20 ENCOUNTER — Ambulatory Visit

## 2009-08-20 ENCOUNTER — Ambulatory Visit: Admitting: Internal Medicine

## 2009-08-20 VITALS — BP 107/71 | HR 75 | Temp 98.5°F | Resp 12 | Wt 216.9 lb

## 2009-08-20 MED ORDER — PREDNISONE 20 MG TABLET
ORAL_TABLET | ORAL | Status: AC
Start: 2009-08-20 — End: 2009-09-01

## 2009-08-20 NOTE — Progress Notes (Signed)
SUBJECTIVE:  [name] is a 58yr male who presents for ongoing foot pain, presumed gout. Mechanism of injury: none. History of gout. Symptoms similar to usual episodes. Unclear triggers. On higher protein diet recently, still on allopurinol. Indocin and colchicine only partially helpful in controlling symptoms. ongoign symptoms for 3 weeks now, not resolving.  Localized symptoms to second MTP on the right and sometimes 5th MTP on the left.    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  Appearance: in no apparent distress.  Foot/ankle exam: no deformity. Mild redness, swelling distal right foot, tenderness to palpation 2nd MTP  Old healing blister on plantar aspect; no cellulitis. Ankle exam normal.  Normal capillary refill, pulses palpable.    X-ray: ordered, but results not yet available.      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 Patient  verbalizes understanding of teaching and instructions.    Electronically Signed By:     Woodward Ku M.D.  Attending Physician  [facility]

## 2009-08-20 NOTE — Nursing Note (Signed)
>>   [name]     Wed Aug 20, 2009  8:04 AM  vital signs taken, allergies verified,screened for pain,and medication history taken.    [name], [facility]

## 2009-08-21 ENCOUNTER — Encounter: Payer: Self-pay | Admitting: Internal Medicine

## 2009-08-21 NOTE — Telephone Encounter (Signed)
From: Mundie,Malahki   To: Aurelio Jew, MD   Sent: Wed Aug 20, 2009 8:14 PM   Subject: Non-urgent Medical Advice Question    Dr. Felicita Gage,  UNBELIEVABLE!!! Wow....what can I say.Marland KitchenMarland KitchenI am a big dope for self-diagnosing...had I gone to you at the on-set--3 weeks of misery woud never have happened. I wondered why he was taking a picture of my ankle! Anyway, 2 pills (40mg ) at 12:30 today and it has completely disappeared! I am so speechless. I am going to try to see you on 11/27.Marland KitchenMarland KitchenC-Reactive protein puzzles me. THANK YOU!!!!!    [name]

## 2009-08-25 ENCOUNTER — Ambulatory Visit: Admitting: Internal Medicine

## 2009-08-25 VITALS — BP 118/73 | HR 87 | Temp 97.9°F | Resp 18 | Wt 218.6 lb

## 2009-08-25 MED ORDER — OXYCODONE-ACETAMINOPHEN 5 MG-325 MG TABLET
1.0000 | ORAL_TABLET | ORAL | Status: DC | PRN
Start: 2009-08-25 — End: 2009-09-30

## 2009-08-25 NOTE — Progress Notes (Signed)
[name] is a 58yr old male presents for follow-up right foot pain. Initially thought had a positive response to prednisone but symptoms gradually worsened after the first day of prednisone. Having significant pain with weightbearing, having to use wife's Percocet to control pain.    Denies any fever or chills. No intercurrent trauma or injury. Recent labs notable for elevated sedimentation rate at 41 and CRP elevated. X-ray negative for significant fracture or gouty arthropathy.    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  Alert and oriented, pleasant and cooperative.  Foot exam notable for swollen second toe and exquisite tenderness to palpation over the second metatarsophalangeal joint.      Impression/Plan:      719.47 Pain in joint, ankle and foot  (primary encounter diagnosis)  Comment: Unclear etiology accounting for patient's symptoms, concern for possible infection versus stress fracture versus osteonecrosis versus autoimmune process  Plan: discussed case with Dr. Eusebio Me  Physician to physician consultation required for approval of MRI of his foot by patient's insurance, obtained  Due to patient's insurance will have to get imaging at outside facility, being scheduled urgently  Pain management as per orders, continue prednisone for now at 20 mg per day until follow-up MRI, follow-up in the office post MRI    716.97 Unspecified arthropathy, ankle and  foot  Comment: see above   Plan: follow up as above     I spent with the patient, 20 minutes of which were spent counseling and coordinating care as noted above.        Barriers to Learning assessed: none. Patient verbalizes understanding of teaching and instructions.    Electronically Signed By:     Woodward Ku M.D.  Attending Physician  [facility]

## 2009-08-25 NOTE — Nursing Note (Signed)
>>   STEFANIE MURPHY     Mon Aug 25, 2009  2:39 PM  Vital signs taken, allergies verified, screened for pain, med hx taken,  screened for chicken pox, and verified immunization status.  [name], MA

## 2009-08-26 ENCOUNTER — Telehealth: Payer: Self-pay | Admitting: Internal Medicine

## 2009-08-26 NOTE — Telephone Encounter (Signed)
Will hold for PCP

## 2009-08-26 NOTE — Telephone Encounter (Signed)
Dr. Audree Camel from [facility] requests a call back from Dr. Felicita Gage on Wednesday to discuss patient's MRI results.

## 2009-08-27 ENCOUNTER — Encounter: Payer: Self-pay | Admitting: Internal Medicine

## 2009-08-27 ENCOUNTER — Encounter: Admitting: Internal Medicine

## 2009-08-27 ENCOUNTER — Ambulatory Visit: Admitting: Internal Medicine

## 2009-08-27 VITALS — BP 124/81 | HR 99 | Temp 98.6°F | Resp 18

## 2009-08-27 MED ORDER — METHYLPREDNISOLONE 16 MG TABLET
3.0000 | ORAL_TABLET | Freq: Every day | ORAL | Status: DC
Start: 2009-08-27 — End: 2009-09-03

## 2009-08-27 NOTE — Telephone Encounter (Signed)
Results reviewed. See office visit.  Anca Knoepfler M.D.

## 2009-08-27 NOTE — Nursing Note (Signed)
>>   STEFANIE MURPHY     Wed Aug 27, 2009 11:02 AM  Vital signs taken, allergies verified, screened for pain, med hx taken,  screened for chicken pox, and verified immunization status.  [name], MA

## 2009-08-27 NOTE — Progress Notes (Signed)
[name] is a 59yr old male presents for follow-up of foot pain. MRI done at outside facility due to insurance issues. Notable findings of the second metatarsal phalangeal joint instability, tears of the plantar plate and radial collateral ligament. Also notable findings consistent with synovitis and first intermetatarsal bursitis. No evidence of abscess/infection. First MTP joint effusion/capsulitis noted as well.    Symptoms mildly improved with immobilization as well as analgesics/Percocet.    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  Alert and oriented, pleasant and cooperative.      Impression/Plan:      716.90E Acute arthritis  (primary encounter diagnosis)  Comment: impression diagnosis includes gouty arthritis versus CPPD versus other year to be defined inflammatory causes  No evidence of infection  Discussed with Dr. Eusebio Me  Plan: continue immobilization in the boot, gentle range of motion exercises at the ankle at least 2-3 times a day, p.r.n. Analgesics, start Medrol, stop prednisone, follow-up one week with consideration for consultation formally with Dr. Eusebio Me and possible aspiration of the first metatarsal phalangeal joint to aid with diagnosis      Barriers to Learning assessed: none. Patient verbalizes understanding of teaching and instructions.    Electronically Signed By:     Woodward Ku M.D.  Attending Physician  [facility]

## 2009-09-01 ENCOUNTER — Encounter: Admitting: Internal Medicine

## 2009-09-01 ENCOUNTER — Encounter: Payer: Self-pay | Admitting: Internal Medicine

## 2009-09-01 NOTE — Telephone Encounter (Signed)
From: Manzano,Cesario   To: Aurelio Jew, MD   Sent: Mon Sep 01, 2009 7:13 AM   Subject: Non-urgent Medical Advice Question    BINGO!  I was not able to get the Rx until Friday afternoon ([facility]took at 1:30 and by 6:30p.m. ALL pain gone.Marland KitchenMarland KitchenMarland KitchenI went 48 hours without a pain pill until late yesterday...did too much. All is looking good though. Will see you Wednesay or Friday.  [name]

## 2009-09-02 ENCOUNTER — Encounter: Admitting: Cardiovascular Disease

## 2009-09-03 ENCOUNTER — Ambulatory Visit: Admitting: Internal Medicine

## 2009-09-03 ENCOUNTER — Ambulatory Visit

## 2009-09-03 VITALS — BP 138/87 | HR 75 | Temp 97.3°F | Resp 18 | Wt 217.0 lb

## 2009-09-03 MED ORDER — METHYLPREDNISOLONE 16 MG TABLET
2.0000 | ORAL_TABLET | Freq: Every day | ORAL | Status: DC
Start: 2009-09-03 — End: 2009-11-03

## 2009-09-03 MED ORDER — COLCHICINE 0.6 MG TABLET
0.6000 mg | ORAL_TABLET | Freq: Every day | ORAL | Status: DC
Start: 2009-09-03 — End: 2009-11-10

## 2009-09-03 NOTE — Nursing Note (Signed)
>>   [name]     Wed Sep 03, 2009 11:43 AM  Vital signs taken, allergies verified, screened for pain, med hx taken,  screened for chicken pox, and verified immunization status.  [name], MA,a

## 2009-09-03 NOTE — Progress Notes (Signed)
[name] is a 58yr old male presents for follow-up of foot pain. Has done much better since last visit, currently on Medrol which is helpful, decreased need for Percocet. Denies any medication side effects. Recall prolonged episode of inflammatory related changes including soft tissue swelling and joint pain, right second metatarsal phalangeal joint. Recent MRI negative for infection. Known prior history of gout but recent episode somewhat atypical in both presentation in duration.    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  Alert and oriented, pleasant and cooperative.      Impression/Plan:      274.01E Gout attack  (primary encounter diagnosis)  Comment: Continue Medrol, slow taper as per orders, restart colchicine 0.6 mg daily, continue allopurinol, consult with Dr. Eusebio Me  Plan: Colchicine 0.6 mg Tablet, RHEUMATOLOGY REFERRAL        Follow-up sooner if needed      Barriers to Learning assessed: none. Patient verbalizes understanding of teaching and instructions.    Electronically Signed By:     Woodward Ku M.D.  Attending Physician  [facility]

## 2009-09-05 ENCOUNTER — Encounter: Admitting: Cardiovascular Disease

## 2009-09-08 ENCOUNTER — Ambulatory Visit: Admitting: Rheumatology

## 2009-09-08 VITALS — BP 125/80 | HR 85 | Temp 96.8°F | Wt 219.2 lb

## 2009-09-08 DIAGNOSIS — M064 Inflammatory polyarthropathy: Secondary | ICD-10-CM

## 2009-09-08 NOTE — Nursing Note (Signed)
>>   Lavella Lemons, RN     Mon Sep 08, 2009 12:53 PM  Vital signs taken, allergies verified, pain screened and documented.  Raynelle Fanning Wineinger R.N.

## 2009-09-08 NOTE — Progress Notes (Signed)
Dear Dr. Felicita Hensley:    Thank you very much for your request of a rheumatology consultation on [name], a 58 year old unemployed white male. The patient states that 20 years ago, he had right foot swelling that was clinically diagnosed as gout without arthrocentesis. It did not recur until 10/28, when he again had right forefoot swelling, primarily of the second MTP joint. He was presumptively treated with indomethacin plus colchicine without effect. Uric acid at that time was normal. The patient then had an MR of the foot which demonstrated a tear of the plantar plate and radial collateral ligament as a result of trauma in 1999 at which time the patient had an ORIF. There was also tenosynovitis of the of the first intermetatarsal and arthritis with effusion of the first MTP joint. The patient was switched to prednisolone 48 mg with colchicine 0.6 mg daily, with benefit. The patient has no a.m. stiffness and his foot symptoms affects his exercises.    The patient has not had any constitutional symptoms, anemia or blood clots. He denies any significant mucocutaneous symptoms, ocular symptoms or oral ulcerations. The patient has had multiple stents for his coronary artery disease, but no GI or GU symptoms. He has not had any numbness.  Past medical history and review of systems is notable for hypertension for which he is on lisinopril-HCTZ, hyperlipidemia for which he is on Vytorin plus fenofibrate acid, and GERD for which he is on Protonix. Family history is negative for arthritis. Social history: The patient is a nonsmoker; he drinks 200 ounces of wine per week. Meds: Allopurinol 300 mg daily low-dose ASA, Plavix.    Objective data: BP 125/80 pulse 85. Afebrile. Weight 219. HEENT: Conjunctivae, tympanic membranes, nasal mucosa and poharynx are clear. Neck was supple without masses. Chest was clear to auscultation. C-V: no murmurs or rubs. Abdomen was without organomegaly, masses or tenderness. Extremities were  without cyanosis or edema. Musculoskeletal examination of the upper extremity joints was without swelling, tenderness or limitation range of motion, except there was deformity of the right fifth PIP joint. Lower estremity joints are without swelling, tenderness or limitation range of motion, except there is swelling and tenderness of the right second MCP and PIP joints. Skin: No acute rashes area and DTRs 2+ and equal.    Laboratories: 11/10, chem panel is normal. Uric acid 3.9. CRP 2.7, elevated. 10/10, CBC normal except for lymphopenia. Stool C./S. Negative.    Assessment/plan: Oligoarthritis. I'm unable to classify the patient's arthritis; a crystal arthropathy is suspect, however, the course is atypical with its poor response to corticosteroids. An alternate diagnosis would be a reactive arthritis due to his diarrhea that occurred one week prior to his arthritis, for which we'll request B-27 and urinalysis testing. The patient will continue his methyl prednisolone tapering course plus low-dose colchicine. He will return in followup in one month.    I appreciate being able to assist in his care.

## 2009-09-10 ENCOUNTER — Encounter

## 2009-09-10 LAB — URINALYSIS-COMPLETE
Bilirubin Urine: NEGATIVE
Glucose Urine: NEGATIVE mg/dL
Ketones: NEGATIVE mg/dL
Leuk. Esterase: NEGATIVE
Nitrite Urine: NEGATIVE
Occult Blood Urine: NEGATIVE mg/dL
Protein Urine: NEGATIVE mg/dL
Specific Gravity: 1.012 (ref 1.002–1.030)
Urobilinogen.: NEGATIVE mg/dL (ref ?–2.0)
pH URINE: 7 (ref 4.8–7.8)

## 2009-09-13 LAB — HLA-B27 SENDOUT: HLA-B27: POSITIVE — AB

## 2009-09-22 ENCOUNTER — Encounter: Payer: Self-pay | Admitting: Family Medicine

## 2009-09-22 NOTE — Telephone Encounter (Signed)
From: Carmicheal,Louden   To: Aurelio Jew, MD   Sent: Mon Sep 22, 2009 11:15 AM   Subject: Non-urgent Medical Advice Question    Hello Dr. Felicita Gage,  I am starting week #8 with this nightmare foot.It will be 3 more weeks before I can see Dr. Salvatore Decent and I cannot imagine that. The steroids have done nothing(1/day now). I have tried to be frugal with the Percocet (2 left)...mostly Tylenol w/codeine (7/day) and a bottle of wine--kills the pain. 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 Do you have any thoughts?  [name]

## 2009-09-30 ENCOUNTER — Other Ambulatory Visit: Payer: Self-pay | Admitting: Internal Medicine

## 2009-09-30 NOTE — Telephone Encounter (Signed)
Prescription Refills   Pending Prescriptions Disp Refills    Oxycodone  5 mg/Acetaminophen 325 mg (PERCOCET=ACET 325/OXYCODONE 5) 5-325 mg Tablet 40 Tab 0     Sig: Take 1 Tab by mouth every 4 hours if needed for pain.     FYI:  He is out of this medication.    Please call for any questions.

## 2009-09-30 NOTE — Telephone Encounter (Signed)
Patient needs office visit for refill

## 2009-10-06 MED ORDER — OXYCODONE-ACETAMINOPHEN 5 MG-325 MG TABLET
1.0000 | ORAL_TABLET | Freq: Four times a day (QID) | ORAL | Status: DC | PRN
Start: 2009-09-30 — End: 2009-11-10

## 2009-10-06 NOTE — Telephone Encounter (Signed)
Prescription written.  Tangela Dolliver M.D.

## 2009-10-06 NOTE — Telephone Encounter (Signed)
Spoke with patient on 09/30/09 he was advised of Dr. Hessie Diener. Patient advised that he does not need to be seen. Patient advised that he will wait until Dr. Felicita Gage comes back.  [name]  MA I

## 2009-10-09 ENCOUNTER — Encounter: Payer: Self-pay | Admitting: Internal Medicine

## 2009-10-09 NOTE — Telephone Encounter (Signed)
From: Hayward,Saahil   To: Aurelio Jew, MD   SentMorey Hummingbird Oct 09, 2009 3:09 PM   Subject: Non-urgent Medical Advice Question    Happy New Year Dr.Knoepfler,  I have an appointment with Dr. Eusebio Me on Tuesday morning but I just wanted to let you know that over the past couple of weeks I have developed shaky hands. It seems to get worse each day and as the day goes on...to the point where I cannot even write my name. My younger sister has Graves Disease (in remission). I am not going to self diagnose though...just giving you a heads up. Thanks    [name] [employer]

## 2009-10-14 ENCOUNTER — Ambulatory Visit

## 2009-10-14 ENCOUNTER — Ambulatory Visit: Admitting: Rheumatology

## 2009-10-14 ENCOUNTER — Encounter

## 2009-10-14 VITALS — BP 127/78 | HR 98 | Temp 97.0°F | Wt 224.1 lb

## 2009-10-14 DIAGNOSIS — Z79899 Other long term (current) drug therapy: Secondary | ICD-10-CM

## 2009-10-14 DIAGNOSIS — M4690 Unspecified inflammatory spondylopathy, site unspecified: Secondary | ICD-10-CM

## 2009-10-14 LAB — COMPREHENSIVE METABOLIC PANEL
Alanine Transferase (ALT): 41 U/L (ref 6–63)
Albumin: 4 g/dL (ref 3.4–4.8)
Alkaline Phosphatase (ALP): 32 U/L — ABNORMAL LOW (ref 35–115)
Aspartate Transaminase (AST): 41 U/L (ref 15–43)
Bilirubin Total: 0.9 mg/dL (ref 0.3–1.3)
Calcium: 9.7 mg/dL (ref 8.6–10.5)
Carbon Dioxide Total: 25 mEq/L (ref 24–32)
Chloride: 100 mEq/L (ref 95–110)
Creatinine Blood: 0.86 mg/dL (ref 0.44–1.27)
E-GFR, African American: >60 SEE NOTE (ref >60)
E-GFR, Non-African American: >60 SEE NOTE (ref >60)
Glucose: 127 mg/dL — ABNORMAL HIGH (ref 70–110)
Potassium: 3.9 mEq/L (ref 3.3–5.0)
Protein: 7 g/dL (ref 6.3–8.3)
Sodium: 136 mEq/L (ref 135–145)
Urea Nitrogen, Blood (BUN): 19 mg/dL (ref 8–22)

## 2009-10-14 LAB — CBC WITH DIFFERENTIAL
Basophils % Auto: 0.3 %
Basophils Abs Auto: 0 10*3/uL (ref 0–0.2)
Eosinophils % Auto: 1.9 %
Eosinophils Abs Auto: 0.1 10*3/uL (ref 0–0.5)
Hematocrit: 41.5 % (ref 41–53)
Hemoglobin: 14.3 GM/DL (ref 13.5–17.5)
Lymphocytes % Auto: 19.8 %
Lymphocytes Abs Auto: 1.3 10*3/uL (ref 1.0–4.8)
MCH: 33.6 PG — ABNORMAL HIGH (ref 27–33)
MCHC: 34.4 % (ref 32–36)
MCV: 97.8 UM3 (ref 80–100)
MPV: 7 UM3 (ref 6.8–10.0)
Monocytes % Auto: 19.1 %
Monocytes Abs Auto: 1.3 10*3/uL — ABNORMAL HIGH (ref 0.1–0.8)
Neutrophils % Auto: 58.9 %
Neutrophils Abs Auto: 3.9 10*3/uL (ref 1.80–7.70)
Platelet Count: 183 10*3/uL (ref 130–400)
RDW: 13.9 UNITS (ref 0–14.7)
Red Blood Cell Count: 4.25 10*6/uL — ABNORMAL LOW (ref 4.5–5.9)
White Blood Cell Count: 6.7 10*3/uL (ref 4.5–11.0)

## 2009-10-14 LAB — C-REACTIVE PROTEIN: C-Reactive Protein: 1 mg/dL — ABNORMAL HIGH (ref 0–0.8)

## 2009-10-14 MED ORDER — INDOMETHACIN ER 75 MG CAPSULE,EXTENDED RELEASE
75.0000 mg | EXTENDED_RELEASE_CAPSULE | Freq: Two times a day (BID) | ORAL | Status: AC
Start: 2009-10-14 — End: 2010-10-14

## 2009-10-14 NOTE — Nursing Note (Signed)
>>   Lavella Lemons, RN     Tue Oct 14, 2009  8:36 AM  Vital signs taken, allergies verified, pain screened and documented.  Raynelle Fanning Wineinger R.N.

## 2009-10-14 NOTE — Progress Notes (Signed)
The patient is seen in followup for his oligoarthritis. His symptoms are unchanged and significantly interfere with his activities of daily living.    Laboratories: 12/10, HLA-B27 positive. Urinalysis normal.    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 The patient likely has a reactive arthritis related to his prior appendicitis/diarrhea. The diagnosis was discussed and a handout provided. Management with indomethacin SR 75 mg b.i.d. was recommended, with Protonix. The patient will return in 6 weeks, unless the indomethacin is without effect, with which the patient will call.  A handout on sulfasalazine was also provided.

## 2009-10-15 ENCOUNTER — Encounter: Payer: Self-pay | Admitting: Internal Medicine

## 2009-10-15 NOTE — Telephone Encounter (Signed)
From: Hensley,Joshua   To: Aurelio Jew, MD   Sent: Wed Oct 15, 2009 9:38 AM   Subject: Non-urgent Medical Advice Question    Hi Dr. Felicita Gage,  I finally got a diagnosis yesterday from Dr. 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 I just received lab results but do not understand. If it was from yesterday I never fasted.....did think I had to....sugar is way high. Also which one is for my thyroid? Nothing else seems too far out of whack?  Thank you  [name]

## 2009-10-16 ENCOUNTER — Encounter: Payer: Self-pay | Admitting: Internal Medicine

## 2009-10-16 NOTE — Telephone Encounter (Signed)
From: Kram,Treshon   To: Aurelio Jew, MD   Sent: Wed Oct 15, 2009 7:10 PM   Subject: Non-urgent Medical Advice Question    Hi Dr. Felicita Gage,  I think I need to see you...long story short.Marland KitchenMarland KitchenMarland KitchenLafonda Mosses is having back surgery...possibly two more...her surgeon tells her not to take tylenol and percocet together/ I alternate daily so as to not take too much percocet (Tylenol w/codeine)?? She currently has a migraine and is obviously not too coherent so I don't know what to do about my own medication. The double dose of Indocin is working very well BTW.....  Sincerely,  [name] [employer]

## 2009-10-22 ENCOUNTER — Encounter: Admitting: Internal Medicine

## 2009-10-28 ENCOUNTER — Encounter

## 2009-10-28 ENCOUNTER — Other Ambulatory Visit: Payer: Self-pay | Admitting: Rheumatology

## 2009-10-28 DIAGNOSIS — M4690 Unspecified inflammatory spondylopathy, site unspecified: Secondary | ICD-10-CM | POA: Insufficient documentation

## 2009-10-28 LAB — CREATININE/E-GFR
Creatinine Blood: 1.03 mg/dL (ref 0.44–1.27)
E-GFR, African American: >60 SEE NOTE (ref >60)
E-GFR, Non-African American: >60 SEE NOTE (ref >60)

## 2009-10-28 LAB — HEPATIC FUNCTION PANEL
Alanine Transferase (ALT): 35 U/L (ref 6–63)
Albumin: 3.9 g/dL (ref 3.4–4.8)
Alkaline Phosphatase (ALP): 33 U/L — ABNORMAL LOW (ref 35–115)
Aspartate Transaminase (AST): 32 U/L (ref 15–43)
Bilirubin Direct: <0.1 mg/dL (ref 0.0–0.2)
Bilirubin Total: 0.7 mg/dL (ref 0.3–1.3)
Protein: 6.8 g/dL (ref 6.3–8.3)

## 2009-10-28 LAB — CBC WITH DIFFERENTIAL
Basophils % Auto: 0.5 %
Basophils Abs Auto: 0 K/MM3 (ref 0–0.2)
Eosinophils % Auto: 3.5 %
Eosinophils Abs Auto: 0.2 K/MM3 (ref 0–0.5)
Hematocrit: 40.1 % — ABNORMAL LOW (ref 41–53)
Hemoglobin: 13.7 g/dL (ref 13.5–17.5)
Lymphocytes % Auto: 22.9 %
Lymphocytes Abs Auto: 1.3 K/MM3 (ref 1.0–4.8)
MCH: 33.9 pg — ABNORMAL HIGH (ref 27–33)
MCHC: 34.3 % (ref 32–36)
MCV: 98.8 UM3 (ref 80–100)
MPV: 7.9 UM3 (ref 6.8–10.0)
Monocytes % Auto: 22.1 %
Monocytes Abs Auto: 1.3 K/MM3 — ABNORMAL HIGH (ref 0.1–0.8)
Neutrophils % Auto: 51 %
Neutrophils Abs Auto: 2.9 K/MM3 (ref 1.80–7.70)
Platelet Count: 190 K/MM3 (ref 130–400)
Platelet Estimate, Smear: NORMAL
RBC- Color, Size, Shape: NORMAL
RDW: 14.2 U (ref 0–14.7)
Red Blood Cell Count: 4.06 M/MM3 — ABNORMAL LOW (ref 4.5–5.9)
White Blood Cell Count: 5.7 K/MM3 (ref 4.5–11.0)

## 2009-10-31 ENCOUNTER — Telehealth: Payer: Self-pay | Admitting: Cardiovascular Disease

## 2009-10-31 DIAGNOSIS — I209 Angina pectoris, unspecified: Secondary | ICD-10-CM | POA: Insufficient documentation

## 2009-10-31 NOTE — Telephone Encounter (Signed)
Received email from patient.  Patient reports two episodes of angina, once while walking on the treadmill releived by nitro, the second while walking his dog.  Discussed patient with Dr. Rachell Cipro.  Patient to be scheduled for angiogram Monday.  Telephone call to patient to discuss, patient in agreement with the plan.  Patient advised that if he is to have angina not releived by 3 nitroglycerine tablets taken 5 minutes apart to call 911 and present to the ER.  Cath lab nurse scheduler will contact patient regarding time to present to the hospital on Monday.

## 2009-10-31 NOTE — Telephone Encounter (Addendum)
Pre-procedure instructions including not to take viagra for 48 hours, directions and my call-back number left on patient's phone for cardiac catheterization scheduled on 11/03/2009 with 0700 arrival to [facility]. Patient requested to call back to confirm procedure date/time, instructions and to bring all his medicines to the hospital. JNeely,RN,CNS

## 2009-10-31 NOTE — Telephone Encounter (Addendum)
[name] returned my call.  He understood the instructions and will be here @ 0700 on Monday.  I reminded him that if he has CP not relieved by 3 NTG tabs to go to the ER.  He has no further questions for me at this time. JNeely, RN,CNS

## 2009-11-03 ENCOUNTER — Ambulatory Visit: Admit: 2009-11-03

## 2009-11-03 HISTORY — DX: Unspecified osteoarthritis, unspecified site: M19.90

## 2009-11-03 HISTORY — DX: Mental disorder, not otherwise specified: F99

## 2009-11-03 HISTORY — DX: Shortness of breath: R06.02

## 2009-11-03 HISTORY — DX: Atherosclerotic heart disease of native coronary artery without angina pectoris: I25.10

## 2009-11-03 MED ORDER — MEPERIDINE (PF) 50 MG/ML INJECTION SOLUTION
INTRAMUSCULAR | Status: AC
Start: 2009-11-03 — End: ?
  Filled 2009-11-03: qty 1

## 2009-11-03 MED ORDER — ATROPINE 0.1 MG/ML INJECTION SYRINGE
0.5000 mg | INJECTION | INTRAMUSCULAR | Status: DC | PRN
Start: 2009-11-03 — End: 2009-11-03

## 2009-11-03 MED ORDER — LIDOCAINE HCL 10 MG/ML (1 %) INJECTION SOLUTION
INTRAMUSCULAR | Status: AC
Start: 2009-11-03 — End: ?
  Filled 2009-11-03: qty 20

## 2009-11-03 MED ORDER — D5 / 0.45% NACL IV INFUSION
INTRAVENOUS | Status: DC
Start: 2009-11-03 — End: 2009-11-03
  Administered 2009-11-03: 11:00:00 via INTRAVENOUS

## 2009-11-03 MED ORDER — D5 / 0.45% NACL IV INFUSION
INTRAVENOUS | Status: DC
Start: 2009-11-03 — End: 2009-11-03
  Administered 2009-11-03: 08:00:00 via INTRAVENOUS

## 2009-11-03 MED ORDER — MORPHINE 2 MG/ML INJECTION SYRINGE
2.0000 mg | INJECTION | INTRAMUSCULAR | Status: DC | PRN
Start: 2009-11-03 — End: 2009-11-03

## 2009-11-03 MED ORDER — MIDAZOLAM 1 MG/ML INJECTION SOLUTION
INTRAMUSCULAR | Status: AC
Start: 2009-11-03 — End: ?
  Filled 2009-11-03: qty 4

## 2009-11-03 MED ORDER — HYDROXYZINE HCL 10 MG TABLET
25.0000 mg | ORAL_TABLET | ORAL | Status: AC
Start: 2009-11-03 — End: 2009-11-03
  Administered 2009-11-03: 25 mg via ORAL
  Filled 2009-11-03 (×2): qty 3

## 2009-11-03 MED ORDER — LORAZEPAM 1 MG TABLET
1.0000 mg | ORAL_TABLET | ORAL | Status: AC
Start: 2009-11-03 — End: 2009-11-03
  Administered 2009-11-03: 1 mg via ORAL
  Filled 2009-11-03: qty 1

## 2009-11-03 MED ORDER — NITROGLYCERIN 0.4 MG SUBLINGUAL TABLET
0.4000 mg | SUBLINGUAL_TABLET | SUBLINGUAL | Status: DC | PRN
Start: 2009-11-03 — End: 2009-11-03

## 2009-11-03 MED ORDER — HYDROCODONE 5 MG-ACETAMINOPHEN 500 MG TABLET
1.0000 | ORAL_TABLET | ORAL | Status: DC | PRN
Start: 2009-11-03 — End: 2009-11-03

## 2009-11-03 MED ORDER — HEPARIN (PORCINE) 1,000 UNIT/ML INJECTION SOLUTION
INTRAMUSCULAR | Status: AC
Start: 2009-11-03 — End: ?
  Filled 2009-11-03: qty 10

## 2009-11-03 MED ORDER — FLU VACCINE T-V SPLT 2010-11(PF) 45 MCG (15 MCG X 3)/0.5 ML IM SYRINGE
0.5000 mL | INJECTION | Freq: Once | INTRAMUSCULAR | Status: DC
Start: 2009-11-03 — End: 2009-11-03

## 2009-11-03 MED ORDER — DROPERIDOL 2.5 MG/ML INJECTION SOLUTION
INTRAMUSCULAR | Status: AC
Start: 2009-11-03 — End: ?
  Filled 2009-11-03: qty 2

## 2009-11-03 MED ORDER — LISINOPRIL 20 MG-HYDROCHLOROTHIAZIDE 25 MG TABLET
2.0000 | ORAL_TABLET | Freq: Every day | ORAL | Status: DC
Start: 2009-11-03 — End: 2010-01-26

## 2009-11-03 MED ORDER — ASPIRIN 325 MG TABLET,DELAYED RELEASE
325.0000 mg | DELAYED_RELEASE_TABLET | Freq: Every day | ORAL | Status: DC
Start: 2009-11-03 — End: 2009-11-03
  Filled 2009-11-03: qty 1

## 2009-11-03 MED ORDER — MORPHINE 2 MG/ML INJECTION SYRINGE
2.0000 mg | INJECTION | INTRAMUSCULAR | Status: DC
Start: 2009-11-03 — End: 2009-11-03

## 2009-11-03 MED ORDER — HEPARIN (PORCINE) (PF) 1,000 UNIT/500 ML IN 0.9 % SODIUM CHLORIDE IV
INTRAVENOUS | Status: AC
Start: 2009-11-03 — End: ?
  Filled 2009-11-03: qty 1

## 2009-11-03 MED ORDER — NITROGLYCERIN 50 MG/10 ML (5 MG/ML) INTRAVENOUS SOLUTION
INTRAVENOUS | Status: AC
Start: 2009-11-03 — End: ?
  Filled 2009-11-03: qty 1

## 2009-11-03 MED ORDER — PNEUMOCOCCAL 23 POLYVALENT VACCINE 25 MCG/0.5 ML INJECTION SOLUTION
0.5000 mL | Freq: Once | INTRAMUSCULAR | Status: DC
Start: 2009-11-03 — End: 2009-11-03

## 2009-11-03 MED ORDER — NACL 0.9% IV BOLUS - DURATION REQ
100.0000 mL | INTRAVENOUS | Status: DC | PRN
Start: 2009-11-03 — End: 2009-11-03

## 2009-11-03 MED ORDER — MIDAZOLAM 1 MG/ML INJECTION SOLUTION
0.5000 mg | INTRAMUSCULAR | Status: DC
Start: 2009-11-03 — End: 2009-11-03

## 2009-11-03 MED ORDER — MIDAZOLAM 1 MG/ML INJECTION SOLUTION
INTRAMUSCULAR | Status: AC
Start: 2009-11-03 — End: ?
  Filled 2009-11-03: qty 2

## 2009-11-03 NOTE — Nurse Focus (Signed)
Patient ambulate 1 lap around unit. Groin site remain stable. D/c orders written. Lenor Coffin rn

## 2009-11-03 NOTE — Procedures (Addendum)
Section of Cardiovascular Medicine    Patient: [name], [name]   MR #: [id number]  Date of Birth: 04-03-1951  Date of Cardiac Catheterization: 11/03/2009  Referring Physician: Pearlean Brownie, M.D.    NAME OF PROCEDURE:        1. Bilateral selective coronary angiography.  2. Left heart catheterization.  3. Left ventriculography.  4. Right heart catheterization.  5. Right iliofemoral angiography.          PRE-PROCEDURE DIAGNOSES:    1. Coronary artery disease with multiple prior stenting. 2. Progressive angina pectoris.    POST-PROCEDURE DIAGNOSES:    1. Low-normal left ventricular systolic function.  2. Elevated left ventricular end-diastolic pressure. 3. Mild pulmonary venous hypertension.  4. Three-vessel-coronary artery disease essentially unchanged from the prior study.    INDICATIONS FOR PROCEDURE:    [name] is a 59 year old gentleman with history of extensive coronary artery disease with multiple prior interventions. He was recently evaluated for recurrence of angina pectoris and brought back for coronary angiography to exclude progression in coronary artery disease. Of note, he has been known to have moderately severe instant re-stenosis involving the right coronary artery as well as mid left anterior descending.    CONSENT:    Informed consent was obtained from the patient after discussing risks, benefits, and alternatives to the procedure.    DESCRIPTION OF PROCEDURE:    The patient was brought to the Cardiac Catheterization Laboratory in a non-sedated, fasting state where she was draped and prepared in the usual fashion. He was given intravenous Versed and Fentanyl to attain adequate conscious sedation. His right groin was well anesthetized using 1% Lidocaine. The right femoral artery was accessed and the 7- [place] side arm sheath was inserted using modified Seldinger technique. Then the right femoral vein was accessed and an 8-French side arm sheath was inserted using modified Seldinger technique. Then a  7-French Swan-Ganz catheter was inserted and right heart catheterization was performed by standard technique. Hemodynamic tracing was obtained in right atrium, right ventricle and pulmonary artery. Pulmonary capillary wedge pressure was recorded. Cardiac outputs were obtained using Fick principle. The Swan-Ganz catheter was then removed. Then a 7-French JL4 catheter was inserted and left main coronary artery was engaged. Left coronary angiography was performed in multiple views. It was then exchanged for a JR4 catheter and right coronary angiography was performed in multiple views. The catheter was then exchanged for a 7-French pigtail catheter which was placed in the left ventricle. Left ventricular pressures were recorded. Left ventriculography was performed in RAO projection. Pull-back measurements were then performed across the aortic valve. The catheter was then removed over the wire. Right iliofemoral angiography was performed in RAO projection. The catheter was then removed and sheaths were secured to skin. Patient was subsequently transferred back to Telemetry for further observation and management. There were no immediate complications from the procedure.    TOTAL FLUORO TIME: 6.4 minutes.  TOTAL RADIATION: 132.4 cm2.  TOTAL CONTRAST UTILIZATION: 120 mL of Visipaque.    HEMODYNAMIC DATA:    1. Right heart catheterization: Mean right atrial pressure was 14 with end-expiratory phase with A waves of 16, V waves of 14. Right ventricular systolic pressure was 40 with RVEDP of 16. Main pulmonary capillary wedge pressure in end-expiratory phase was 23. Right ventricular pulmonary arterial pressure of 37/20 with mean of 27. Cardiac output using Fick principle was 6.5 L/min with index of 2.95 L/min per meter squared.  2. Left heart catheterization: Opening aortic pressure was 130/62 with  mean of 100. Left ventricular systolic pressure was 134 with LVEDP of 30. There was no gradient across the aortic valve. Closing  aortic pressure was 145/80 with mean of 100.    RADIOGRAPHIC DATA:    Coronary angiography:    1. Left main coronary artery: Left main coronary artery is a large caliber vessel which is mildly calcific. It trifurcates to give rise to left anterior descending artery, left circumflex coronary artery and ramus intermedius.  2. The ramus intermedius is a small caliber vessel which has no significant luminal disease.  3. Left circumflex coronary artery: It is a large caliber, non- dominant vessel. Proximally, it has a long stented segment with mild diffuse in-stent re-stenosis. The first obtuse marginal branch is totally occluded and fills via left to left collaterals. The left circumflex coronary artery then courses down, gives a second small caliber obtuse marginal branch. The left circumflex coronary artery then terminates after giving off third obtuse marginal branch. 4. Left anterior descending: It is a large caliber vessel that goes to the apex of the heart. It has mild disease in proximal course. In its mid segment, it gives off a first diagonal branch which is a small caliber vessel with diffuse disease. The mid segment had diffuse in-stent re-stenosis of about 70%. It then courses down and gives off second diagonal branch. Distally, the left anterior descending artery remains small to medium caliber vessel and terminates after wrapping around the apex of the heart. The left anterior descending artery collateralizes the distal right coronary artery.  5. Right coronary artery: It is a large caliber vessel. In its proximal, mid and distal course there is extensive stenting with severe diffuse in-stent re-stenosis of 70-80%. Distally, it gives off a small posterior descending artery. The right posterolateral ventricular branch fills via collaterals from the left. 6. Left ventricular angiography injection contours reveal low- normal ventricular function with ejection fraction of 50%. Mild diffuse hypocontractility  noted. No significant mitral insufficiency noted.  7. Right iliofemoral angiography: reveals sheath insertion at the level of the mid femoral head. There is no dissection or extravasation contrast. The right external iliac artery is highly tortuous with no significant luminal disease.    IMPRESSION:    [name] has significant three-vessel coronary artery disease with significant in-stent re-stenosis involving the left anterior descending artery and right coronary artery. The anatomy is essentially unchanged form the prior study. At this conjuncture, we will further optimize medical regimen. He has high filling pressures, hence, will consider increasing the current dose of afterload reduction and diuretics. In future, if he remains symptomatic despite optimal medical therapy then surgical revascularization will be the preferred option.    Dr. Rachell Cipro was present throughout the procedure.                THIS WAS ELECTRONICALLY SIGNED - 11/06/2009 10:19 AM PST BY: REGINALD I LOW, MD  PROFESSOR AND CHIEF  SURGEON        THIS WAS ELECTRONICALLY SIGNED - 11/05/2009 1:13 PM PST BY: Coolidge Breeze, MD  CLINICAL FELLOW  ASSISTANT SURGEON  DIVISION OF CARDIOLOGY  DEPARTMENT OF INTERNAL MEDICINE      ZO:[id number])    D: 11/03/2009 02:05 PM  T: 11/03/2009 04:36 PM  C#: [id number]

## 2009-11-03 NOTE — Progress Notes (Addendum)
Interventional Cardiology    Post Cath, no complaints, denies chest pain or shortness of breath     Vital Signs  BP 119/73  Pulse 73  Temp 36.7 C (98 F)  Resp 18  Ht 1.803 m (5\' 11" )  Wt 101.651 kg (224 lb 1.6 oz)  BMI 31.26 kg/m2  SpO2 100%      PHYSICAL EXAM:      HEENT: Pupil equal and reactive, no pallor or icterus  NECK: no JVD or hepatojugular reflux, carotoid upstrokes are normal, No thyromegaly, no bruits  RESP: Bilateral air entry is normal, clear to ascultation bilterally  CVS: PMI non displaced, no parasternal heave, normal S1,  S2 physiological split, no gallop or rub noted  ABD: Soft, no organomegaly, no bruits  EXT: no clubbing, cyanosis or edema, no dependent rubor  PULSES: Bilateral femoral and popliteal pulses are normal. PT/DP are 2+ bilaterally  VASCULAR ACCESS SITE: Right groin, no bruise or erythema or hematoma or pulsating mass  NEURO: Grossly non focal   SKIN: No rash or bruises      ASSESSMENT:      1. 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  Elevated LV filling pressures           PLAN:     1. Increase lisinopril-hydrochloorthiazide on 20-25 to 2 tablets/day  2. Discharge today, follow up with Dr Low in a week  3. If symptomatic despite further optimization of medical therapy then CABG would be the preferred revascularization    Coolidge Breeze, MD  Interventional Cardiology Fellow   Pager [phone]

## 2009-11-03 NOTE — H&P (Addendum)
BRIEF HISTORY & PHYSICAL  Service:  Cardiology  Note started: 11/03/2009, 0729    Date of Service:   11/03/09    CHIEF COMPLAINT:  Elective cardiac cath      History of Present Illness:    59 yo man with history of HTN, dyslipidemia and CAD s/p 25 stents in the past presents today for elective cath due to recurring anginal symptoms.  Pt states that last intervention was in March 2008.  Since then he had 1 cath in 2009 with FFR that was not significant.  He has done well until recently.  2 weeks ago, pt had chest pain and SOB on treadmill.  He also noted he has symptoms when walking his dog.    Currently asymptomatic.      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    Alprazolam (XANAX) 0.25 mg Tab Tablet Take 1 Tab by mouth three times daily if needed.    ANACIN 400 MG-32 MG TAB 2 daily    Carvedilol (COREG) 25 mg PO Tablet Take 1 Tab by mouth 2 times daily.    CENTRUM SILVER TAB take 1 tablet by oral route once daily    Clopidogrel (PLAVIX) 75 mg PO Tablet Take 1 Tab by mouth every day.    Colchicine 0.6 mg Tablet Take 1 Tab by mouth every day.    Ezetimibe 10 mg/Simvastatin 40 mg (VYTORIN 10/40) 10-40 mg PO Tablet Take 1 Tab by  mouth every day.    Fenofibric Acid (TRILIPIX) 135 mg PO CpDR Take 1 Tab by mouth every day.    Fluticasone (FLONASE) 50 mcg/Actuation NA nasal spray Instill 2 Sprays into EACH nostril every day.    Indomethacin (INDOCIN SR) 75 mg SR capsule Take 1 Cap by mouth 2 times daily, after meals.    Indomethacin (INDOCIN) 25 mg Capsule Take 1-2 Caps by mouth three times daily if needed for pain.    Lisinopril-Hydrochlorothiazide (PRINZIDE, ZESTORETIC) 20-25 mg PO per tablet Take 1 Tab by mouth every day.    MethylPREDNISolone (MEDROL) 16 mg tablet Take 2 Tabs by mouth once daily after a meal. For 7-10 days then 1 daily for 7-10 days then off    OMEGA-3 FATTY ACIDS PO 4800 mg daily    Oxycodone  5 mg/Acetaminophen 325 mg (PERCOCET=ACET 325/OXYCODONE 5) 5-325 mg Tablet Take 1 Tab by mouth every 6 hours if needed for pain.    Pantoprazole (PROTONIX) 40 mg PO Delayed Release Tablet Take 1 Tab by mouth 2 times daily.    Sildenafil (VIAGRA) 50 mg PO Tablet Take  by mouth. One tablet one time a dail  one hour before sexual activity as needed for erectile dysfunction.    VITAMIN B-12 1,000 MCG TAB once daily    VITAMIN C PO 400 mg daily        Scheduled Medications     Aspirin (ECOTRIN) EC Tablet 325 mg, ORAL, QAM  HydrOXYzine (ATARAX, VISTARIL) Tablet 25 mg, ORAL, PRE-MED  Lorazepam (ATIVAN) Tablet 1 mg, ORAL, PRE-MED      Continuous Medications  D5 / 0.45% NaCl, , IV, CONTINUOUS      PRN Medications - N     Nitroglycerin (NITROSTAT) Sublingual Tablet 0.4 mg, SUBLINGUAL, PRN      VITAL SIGNS (Last Recorded) Average, Max, and Min within 24 hours   Temp: (not recorded) No Data Recorded    Temp src: (not recorded)    Pulse: (not recorded)  Rhythm: (not recorded) No Data Recorded    BP: (not recorded)    Resp: (not recorded) No Data Recorded    SpO2: (not recorded) No Data Recorded    Height: (not recorded)    Weight: (not recorded)    BSA: (not recorded)      RELEVANT PHYSICAL EXAM:   NAD  No JVD  CTAB  RRR, no  murmurs  Soft NT/ND  Pulses 2+ radial, femoral, DP, PT    Pre-Sedation/Anesthesia Assessment:    2 - Mild, controlled systemic disease and no functional limitations    Airway Assessment:    Mallampati image    Mallampati Class:  2, Oral Eval: Mouth opening normal, Neck ROM:  full, Thyroid-mentum distance in fingerbreaths: 3.    Last Recorded Labs  WHITE BLOOD CELL COUNT (K/MM3)   Date Value   10/28/2009 5.7     HEMOGLOBIN (GM/DL)   Date Value   1/61/0960 13.7     HEMATOCRIT (%)   Date Value   10/28/2009 40.1*    PLATELET COUNT (K/MM3)   Date Value   10/28/2009 190         SODIUM (mEq/L)   Date Value   10/14/2009 136     POTASSIUM (mEq/L)   Date Value   10/14/2009 3.9     UREA NITROGEN, BLOOD (BUN) (mg/dL)   Date Value   4/54/0981 19     CREATININE BLOOD (mg/dL)   Date Value   1/91/4782 1.03        GLUCOSE (mg/dL)   Date Value   9/56/2130 127*    CHLORIDE (mEq/L)   Date Value   10/14/2009 100     CARBON DIOXIDE TOTAL (mEq/L)   Date Value   10/14/2009 25     PROTEIN (g/dL)   Date Value   8/65/7846 6.8       ALBUMIN (g/dL)   Date Value   9/62/9528 3.9     CALCIUM (mg/dL)   Date Value   01/15/2439 9.7     ALKALINE PHOSPHATASE (ALP) (U/L)   Date Value   10/28/2009 33*    ASPARTATE TRANSAMINASE (AST) (U/L)   Date Value   10/28/2009 32       BILIRUBIN TOTAL (mg/dL)   Date Value   10/06/7251 0.7     ALANINE TRANSFERASE (ALT) (U/L)   Date Value   10/28/2009 35     No results found for this basename: inr    No results found for this basename: pt      APTT (SECONDS)   Date Value   01/09/2008 68.0*    FASTING (no units)   Date Value   08/20/2009 YES  CHOLESTEROL (mg/dL)   Date Value   16/07/9603 143     HDL CHOLESTEROL (mg/dL)   Date Value   54/06/8118 37       LDL CHOLESTEROL CALCULATION (mg/dL)   Date Value   14/78/2956 85     TOTAL CHOLESTEROL:HDL RATIO (no units)   Date Value   08/20/2009 3.9     TRIGLYCERIDE (mg/dL)   Date Value   21/30/8657 104     NON-HDL CHOLESTEROL (mg/dl)   Date Value   84/69/6295 106       THYROID STIMULATING  HORMONE (mcIU/mL)   Date Value   10/14/2009 1.30     THYROXINE, FREE (FREE T4) (ng/dL)   Date Value   2/84/1324 0.98     hsCRP (mg/L)   Date Value   07/14/2009 0.9     SED RATE WESTERGREN (no units)   Date Value   08/20/2009 41*      HGB A1C (%)   Date Value   08/20/2009 5.8*    No results found for this basename: ck    No results found for this basename: ckmb    No results found for this basename: troponini        ASSESSMENT:      59 yo man with hx of CAD and new anginal symptoms here for cardiac cath.    PLAN:      Will proceed as scheduled.  Procedure was explained to the patient including all risks and benefits and patient verbalized understanding.  Informed consent was signed and placed in the chart.      Report Electronically Signed by:  Farris Has. Velda Shell, MD, Fellow        I have seen and examined the patient with the resident/fellow and I have verified the history and confirmed the exam.  The plan of care was developed together.  I concur with his/her note.      Su Monks, MD

## 2009-11-03 NOTE — Nurse Progress (Signed)
Pt returned from cath lab, s/p cardiacl cath, no intervention Distal rca 60-100%, EF 50-55. Arterial / Venous sheaths in right  groin. Pt denies pain in puncture site. No hematoma, bruit, or bleeding. Pedal pulses palpable. Pt to acuity. Instructed pt not to move affected extremity. Pt complies with care. Call light in reach  Lenor Coffin, RN RN

## 2009-11-03 NOTE — Patient Instructions (Signed)
Coronary Angiogram/PCI Discharge Instructions   Site care and reportable symptoms:    Bruising, soreness and a little 'bump' at the puncture site are normal.      You may take off the dressing over the puncture site and take a shower the next day.      If you have redness, pain, swelling or drainage from the puncture site call the clinic [phone] and talk to the triage nurse.    If you get a temperature higher than 101.5 F call the clinic [phone] and talk to the triage nurse.    If you start to bleed from the puncture site, LIE DOWN and press firmly over the site until the bleeding stops. Then call the clinic [phone] and talk to the triage nurse.  IF THE BLEEDING DOES NOT STOP CALL 9-1-1  Groin (Leg) Puncture Site:    Do not sit or go into a tub of water or a swimming pool for 7 days.    You may start your normal activities 2 days after the procedure.      Do not lift anything heavier than 10 pounds or do strenuous activity for 5 days.    If your legs begin to feel cool to your touch or painful or numb or dusky/pale in color call the clinic [phone] and talk to the triage nurse.  Radial (Arm) Puncture Site:    Do not move your wrist around a lot for the next 24 hours.    Do not lift anything heavier than 5 pounds with the arm used for puncture for 48 - 72 hours after the procedure.    If your arm feels cool to your touch or painful or numb or dusky/pale in color call the triage nurse in the clinic [phone] and talk to the triage nurse.  Brachial (Elbow Bend) Puncture Site:    Do not move your arm around a lot for the next 24 hours.    Do not lift anything heavier than 5 pounds with the arm used for puncture for 48 - 72 hours after the procedure.    If your arm feels cool to your touch or painful or numb or dusky/pale in color call the triage nurse in the clinic [phone] and talk to the triage nurse.    If you develop chest pain, chest tightness, chest pressure and/or shortness  of breath and/or nausea for more than 5 minutes.... CALL 9-1-1    Medicines:    Take all the medicines your doctor gives to you.    If you are to take Aspirin and/or Plavix (clopidogrel) DO NOT stop these medicines until you call the clinic [phone] talk to the triage nurse.    Don't run out of medicines.  Order more when you have one week of pills left.    Carry a list of your medicines, how much you take and the times you take them every day.  Add any herbal or over-the-counter medicines to your list.    Diet:    Drink at least 8 glasses of water in the next 24 hours.    If your doctor has not placed you on a specific diet follow a low-salt, low-fat diet at home.  IF YOU SMOKE - STOP!      You may be able to take the [place] 'Smoking Cessation' program.    Call [phone] for information.  Also ask your doctor for help to stop                            smoking.      Follow-up Appointments:      Call the clinic [phone] to schedule an appointment in 4-6 weeks.     For urgent matters after hours and on weekends  Call the Main Hospital [phone] and ask for the Cardiologist on-call.                           Revised by Cardiovascular Education Committee 2010

## 2009-11-03 NOTE — Nurse Assessment (Signed)
ADMIT NURSING NOTE    Note Started: 11/03/2009, 1610      Patient admitted at 0730 hours as a direct admit . Pt condition stable . patient oriented to room and unit. MD notified of patient's arrival on unit at 0730 hours. Admission Assessment and Plan of Care initiated. Patient prepped for cardiac cath. Lenor Coffin, RN RN

## 2009-11-03 NOTE — Nurse Focus (Signed)
Sheath Pull    ACT 171 , Pt prepared for sheath pull. Explained sheath pull to pt, positioned in bed.  Right groin prepped per protocol, AV sheaths removed @ 1133 . Manual pressure applied to right groin x 20 min. Pt tolerated sheath pull well. Right  groin with no hematoma or bleeding. Pedal pulses palpable. AV sheaths examined: no kinks or breaks. Sandbag placed to right groin. Instructed pt not to move affected extremity. Vital signs stable.    Lenor Coffin, RN RN

## 2009-11-04 ENCOUNTER — Ambulatory Visit

## 2009-11-07 ENCOUNTER — Ambulatory Visit: Admitting: Cardiovascular Disease

## 2009-11-07 VITALS — BP 126/86 | HR 84 | Ht 71.0 in | Wt 229.8 lb

## 2009-11-07 DIAGNOSIS — I2510 Atherosclerotic heart disease of native coronary artery without angina pectoris: Secondary | ICD-10-CM | POA: Insufficient documentation

## 2009-11-07 MED ORDER — NITROGLYCERIN 0.4 MG SUBLINGUAL TABLET
0.4000 mg | SUBLINGUAL_TABLET | SUBLINGUAL | Status: AC | PRN
Start: 2009-11-07 — End: 2010-11-07

## 2009-11-07 MED ORDER — CARVEDILOL 25 MG TABLET
25.0000 mg | ORAL_TABLET | Freq: Three times a day (TID) | ORAL | Status: DC
Start: 2009-11-07 — End: 2009-12-24

## 2009-11-07 NOTE — Nursing Note (Signed)
>>   [facility]     Fri Nov 07, 2009  3:53 PM  Vitals signs taken,screened for pain,allergies verified, pharmacy verified, EKG done. Baxter Kail, [place]     >> [facility]     Fri Nov 07, 2009  3:50 PM  Pt's med list updated with information provided by patient.Collier Salina LVN

## 2009-11-10 ENCOUNTER — Ambulatory Visit: Admitting: Internal Medicine

## 2009-11-10 ENCOUNTER — Ambulatory Visit

## 2009-11-10 ENCOUNTER — Telehealth: Payer: Self-pay | Admitting: Cardiovascular Disease

## 2009-11-10 ENCOUNTER — Encounter

## 2009-11-10 VITALS — BP 140/94 | HR 80 | Temp 97.5°F | Resp 18 | Ht 71.0 in | Wt 232.5 lb

## 2009-11-10 DIAGNOSIS — F419 Anxiety disorder, unspecified: Secondary | ICD-10-CM | POA: Insufficient documentation

## 2009-11-10 LAB — CBC WITH DIFFERENTIAL
Basophils % Auto: 0.6 %
Basophils Abs Auto: 0 10*3/uL (ref 0–0.2)
Eosinophils % Auto: 5.8 %
Eosinophils Abs Auto: 0.3 10*3/uL (ref 0–0.5)
Hematocrit: 39.5 % — ABNORMAL LOW (ref 41–53)
Hemoglobin: 13.8 GM/DL (ref 13.5–17.5)
Lymphocytes % Auto: 27.2 %
Lymphocytes Abs Auto: 1.6 10*3/uL (ref 1.0–4.8)
MCH: 33.4 PG — ABNORMAL HIGH (ref 27–33)
MCHC: 34.8 % (ref 32–36)
MCV: 96.2 UM3 (ref 80–100)
MPV: 7.6 UM3 (ref 6.8–10.0)
Monocytes % Auto: 13 %
Monocytes Abs Auto: 0.8 10*3/uL (ref 0.1–0.8)
Neutrophils % Auto: 53.4 %
Neutrophils Abs Auto: 3.2 10*3/uL (ref 1.80–7.70)
Platelet Count: 207 10*3/uL (ref 130–400)
RDW: 13.8 UNITS (ref 0–14.7)
Red Blood Cell Count: 4.11 10*6/uL — ABNORMAL LOW (ref 4.5–5.9)
White Blood Cell Count: 5.9 10*3/uL (ref 4.5–11.0)

## 2009-11-10 LAB — BILIRUBIN DIRECT: Bilirubin Direct: 0.1 mg/dL (ref 0.0–0.2)

## 2009-11-10 MED ORDER — LORAZEPAM 0.5 MG TABLET
0.5000 mg | ORAL_TABLET | ORAL | Status: DC
Start: 2009-11-10 — End: 2009-12-24

## 2009-11-10 NOTE — Telephone Encounter (Addendum)
Received e-mail from patient.  Would like to proceed with referral to cardiac surgeon.  Discussed patient with Dr. Rachell Cipro.  Recommends patient be evaluated by Dr. Sumner Boast.  Contacted Heather, Dr. Langston Masker' nurse coordinator.  Will fax records to [facility] for review.  Heather requests patient have carotid duplex scan.  Telephone call to patient to discuss.  Carotid duplex scan ordered.  [facility] will contact patient with date and time.

## 2009-11-10 NOTE — Progress Notes (Signed)
[name] is a 59yr old male presents for follow-up multiple concerns.    Since last visit has had evaluation with Dr Rana Snare regarding ongoing management of his coronary artery disease, was having some anginal symptoms with fairly minimal exercise, repeat cardiac catheter notable for three-vessel coronary disease, in-stent restenosis. Has continued on medical management while awaiting further evaluation by CT surgery at [facility] with plans for CABG. Plans complicated by the fact that patient's wife is currently recovering from cervical spine surgery in [place], no other significant family support locally to help care for patient postop. Trying to make plans as much as possible for setting up assistance.    Has been having some increased difficulty managing anxiety, drinking more alcohol due to this. Not sleeping. Using Xanax 3 times a day fairly regularly now, partially helpful.    Arthritis doing well on indomethacin, recent evaluation with Dr. Eusebio Me notable for reactive arthritis accounting for symptoms.     Reason labs notable for stable chem panel/CBC, mildly elevated glucose, previous hemoglobin A1c at 5.8. Has unfortunately gained weight with decreased ability to exercise in the last few months.    Otherwise at baseline. Denies increased GI related symptoms, abdominal pain, dyspepsia, nausea or vomiting.    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  Eyes:  negative.  Ears, Nose, Mouth, Throat: negative.  CV: exertional chest pain but otherwise negative.  Resp: negative.  GI: negative.  GU: negative.  Musculoskeletal: negative.  Psych: Mood pt's report, insomnia, anxiety, alcohol use as above.    I reviewed the patient's past medical and family/social history, and updated problem list.    OBJECTIVE:  BP 140/94  Pulse 80  Temp(Src) 36.4 C (97.5 F) (Tympanic)  Resp 18  Ht 1.803 m (5\' 11" )  Wt 105.461 kg (232 lb 8 oz)  BMI 32.43 kg/m2  He appears well, in no apparent distress.  Alert and oriented, pleasant and cooperative.  Mental status exam: he is alert, oriented to time, person and place. Normal thought content, speech, affect and grooming are noted.       Impression/Plan:      790.21A Impaired fasting blood sugar  (primary encounter diagnosis)  Comment: avoid excess alcohol use, monitor diet; update labs   Plan: HEMOGLOBIN A1C            414.9 CHRONIC ISCHEMIC HEART DISEASE UNSPECIFIED  Comment: complex case; management as per Cardiology   Plan: discussed plans postop; recommend consideration of rehab post op due to lack of home support    720.9 INFLAM SPONDYLOPATHY NOS  Comment: stable; monitor for increased symptoms while off NSAID's preop   Plan: stop colchicine for now   Follow up as needed     300.00E Anxiety  Comment: increased symptoms; situational  Alcohol cessation recommended; effect on mood and anxiety/sleep discussed   Plan:  stop xanax  Start lorazepam as per orders  Follow up as needed preop; otherwise follow up post op     I spent with the patient, 30 minutes of which were spent counseling and coordinating care as noted above.        Barriers to Learning assessed: none. Patient verbalizes understanding of teaching and instructions.    Electronically Signed By:     Woodward Ku M.D.  Attending Physician  [facility]

## 2009-11-10 NOTE — Nursing Note (Signed)
>>   STEFANIE MURPHY     Mon Nov 10, 2009  8:06 AM  Vital signs taken, allergies verified, screened for pain, med hx taken,  screened for chicken pox, and verified immunization status.  [name], MA

## 2009-11-13 ENCOUNTER — Encounter

## 2009-11-18 ENCOUNTER — Encounter: Payer: Self-pay | Admitting: Internal Medicine

## 2009-11-18 NOTE — Telephone Encounter (Signed)
From: Primiano,Kimball   To: Aurelio Jew, MD   Sent: Sat Nov 15, 2009 1:43 PM   Subject: Non-urgent Medical Advice Question    Update: [name] is finally out of the hospital and will have her staples removed on 2/18. Provided the latest MRI and X-Rays look good she will be clear for take off with my brother and sister-in-law on 2/20.  I met with Dr. Sumner Boast last night and I am scheduled for by-pass (6) at 5:00 A.M. on 2/28. Pre-op is Friday 2/25 at 11:00 a.m.    [name]

## 2009-11-19 ENCOUNTER — Telehealth: Payer: Self-pay | Admitting: Cardiovascular Disease

## 2009-11-19 ENCOUNTER — Telehealth: Payer: Self-pay | Admitting: Internal Medicine

## 2009-11-19 MED ORDER — ENOXAPARIN 80 MG/0.8 ML SUBCUTANEOUS SYRINGE
80.0000 mg | INJECTION | Freq: Two times a day (BID) | SUBCUTANEOUS | Status: DC
Start: 2009-11-19 — End: 2009-12-23

## 2009-11-19 NOTE — Progress Notes (Addendum)
November 09, 2009 RE: [name], [name]    MR#: [id number]   DOB: 26-Sep-1951   Date of Service: 11/07/2009       Woodward Ku, MD  PRIMARY CARE NETWORK  [facility]    RE: [name]    Dear Dr. Felicita Gage:    I had the pleasure of seeing [name] in our Cardiology Clinic for a followup evaluation after recent cardiac catheterization with angiography.     As you know, he is a 59 year old gentleman with a history of hypertension, hyperlipidemia, and long history of coronary artery disease. In 1995, the patient had chest pain and underwent angiography, at which time he had percutaneous coronary intervention. Ultimately, over the years he had 25 stents implanted while living in [place], and most of these stents ere in the right coronary artery. He had 15 stents in the right coronary artery and 10 stents had been implanted in the left coronary system. Angiography in 2009 at [facility] demonstrated moderate in-stent restenosis of the right coronary artery and the LAD. He was continued on medical antianginal therapy.     Recently, the patient complained of increasing chest discomfort consistent with his prior angina. He, therefore, underwent restudy with repeat cardiac catheterization nand angiography on November 03, 2009. The patient has slightly elevated pulmonary artery mean pressure of 27, and his pulmonary capillary wedge pressure is 23 with an LVEDP of 30. Coronary angiography demonstrated 70% stenosis, which is in-stent in the mid left anterior descending coronary artery. There is an occluded small obtuse marginal branch, stents in the LAD and circumflex are otherwise patent. The right coronary artery has 15 deployed previous stents extending from the ostium to the distal right coronary artery, and there are multiple areas of in-stent restenosis of 80-90%. His distal right coronary artery and posterior lateral branch is filled by collateral from the left coronary injection. Clearly, he has had  progression of disease and is symptomatic predominantly form his right coronary artery.    The patient's wife has undergone recent neck surgery in [place] and is now being discharged to a rehab facility. The patient has been considering the various options for treatment, and I have strongly recommended that he consider surgical revascularization with coronary artery bypass graft surgery. Treatment of his in-stent restenosis of the right coronary artery will likely result in recurrent restenosis and not give him a durable result. The patient has not had dyspnea, orthopnea, or paroxysmal nocturnal dyspnea. Denies palpitations, dizziness or syncope.     His medical regimen was increased so that now he is on lisinopril 40 mg daily with hydrochlorothiazide 50 mg daily. His other medications include vitamin B12 1000 mg daily, Coreg 25 mg b.i.d., Indocin SR 75 mg once or twice daily as needed, Xanax 0.25 mg p.r.n., allopurinol 300 mg daily, Plavix 75 mg daily, Vytorin 10/40 daily,  135 mg daily, Protonix 40 mg daily, Viagra p.r.n., aspirin 2 daily.    Physical Exam: Pleasant male in no acute distress. Blood pressure 123/84, pulse 80, respirations 16, weight 229 pounds. Chest: Clear. Cardiovascular: Jugular venous pressure 6 cm of water. Carotid pulses normal. S4 gallop at the apex. A 1/6 systolic murmur at the lower left sternal border. Extremities: No edema.     EKG: Normal sinus rhythm. Heart rate 76. Small amount of diagnostic Q waves in 2, 3 and AVF. Slight  and ST elevation in 2, 3, AVF, V2 through V6 consistent with early repolarization.    Assessment: 1) Coronary  artery disease - Status post 25 stents - 15 stents in the right coronary artery and nine in the left coronary system - The patient clearly has progressive restenosis in-stent, especially in the right coronary artery. I believe that his best option for treatment is to consider surgical revascularization with multivessel bypass. The patient is  giving serious consideration to that option. In the meantime, he will use nitroglycerine as needed and increase his Coreg to 25 mg q.8h., in hopes of getting better control of his angina. 2) Myocardial infarction in 1995 - Presumably inferior wall. 3) Hypertension - Controlled. 4) Hyperlipidemia - Good lipid profile. 5) History of gout - On Allopurinol. 6) Obesity - Weight reduction and exercise.    Plan: 1) Increase Coreg to 25 mg q.8h. 2) Contact us to arrange for Cardiac Surgery evaluation. 3) Contact us immediately for progressive cardiac symptoms.    I appreciate this opportunity to continue in the care of [name]. Please contact me if you have any questions regarding his management.      Sincerely,         Alilah Mcmeans I Ayanna Gheen, MD  PROFESSOR AND CHIEF  DIVISION OF CARDIOVASCULAR MEDICINE  DEPARTMENT OF INTERNAL MEDICINE  THIS WAS ELECTRONICALLY SIGNED - 11/19/2009 3:30 PM PST BY:             [id number])    D: 11/09/2009 12:51 PM  T: 11/10/2009 05:28 PM  C#: [id number]

## 2009-11-19 NOTE — Telephone Encounter (Signed)
Received telephone call from [facility] at Dr. Langston Masker' office.  Patient will need to hold plavix for 5 days prior to his CABG procedure scheduled for 12/01/09.  Okay for patient to remain on aspirin.  Patient has a history of stent thrombosis after holding plavix and aspirin for a GI procedure. Question as to whether patient should be bridged with Lovenox.  Discussed patient with Dr. Rachell Cipro.  Dr. Rachell Cipro recommends to bridge patient with Lovenox 80 mg for 9 doses.  Patient to stop plavix 11/25/09 to start Lovenox 11/26/09. Last dose 24 hours prior to the procedure 11/30/09 (Sunday am).  Discussed Lovenox teaching with Judeth Cornfield Dr. Joneen Caraway nurse.  [facility] does shot teaching.  Patient scheduled 11/26/09 at 1:30 [address].  Patient will take first dose at this time.  Telephone call to patient to discuss.  Left message for patient to return call.

## 2009-11-19 NOTE — Telephone Encounter (Signed)
Spoke with wendy she advised that patient going to stop his palvix and start Lovenox injection. Toniann Fail wants to know if we teach patient on how to do injections. Called Arline Asp she advised that patient can be put in at 130 appointment for 30 min's.  Patient needs to start injection on 11/26/09 so patient will have all injectable medication with him at his time of his appointment on 11/26/09 .Marland Kitchen   Toniann Fail will advise patient of appointment time and date and what he needs to bring with him.  [name]  MA I

## 2009-11-19 NOTE — Telephone Encounter (Signed)
Received telephone call back from patient.  Patient advised of the information below.  Patient agrees with the plan.

## 2009-11-19 NOTE — Telephone Encounter (Signed)
Toniann Fail from [facility] called and stated that she would like a call back from nurse. She would like to know if we provide our patients with "Lovenox Teaching". Please call and advise.    Thank you  [name]  [facility]

## 2009-11-21 ENCOUNTER — Encounter

## 2009-11-21 LAB — HEPATIC FUNCTION PANEL
Alanine Transferase (ALT): 33 U/L (ref 6–63)
Albumin: 4.5 g/dL (ref 3.4–4.8)
Alkaline Phosphatase (ALP): 35 U/L (ref 35–115)
Aspartate Transaminase (AST): 33 U/L (ref 15–43)
Bilirubin Direct: <0.1 mg/dL (ref 0.0–0.2)
Bilirubin Total: 1 mg/dL (ref 0.3–1.3)
Protein: 7.2 g/dL (ref 6.3–8.3)

## 2009-11-21 LAB — CBC WITH DIFFERENTIAL
Basophils % Auto: 0.5 %
Basophils Abs Auto: 0 10*3/uL (ref 0–0.2)
Eosinophils % Auto: 2.5 %
Eosinophils Abs Auto: 0.2 10*3/uL (ref 0–0.5)
Hematocrit: 39.5 % — ABNORMAL LOW (ref 41–53)
Hemoglobin: 14 GM/DL (ref 13.5–17.5)
Lymphocytes % Auto: 23.3 %
Lymphocytes Abs Auto: 1.4 10*3/uL (ref 1.0–4.8)
MCH: 34.2 PG — ABNORMAL HIGH (ref 27–33)
MCHC: 35.4 % (ref 32–36)
MCV: 96.8 UM3 (ref 80–100)
MPV: 7.7 UM3 (ref 6.8–10.0)
Monocytes % Auto: 16.1 %
Monocytes Abs Auto: 1 10*3/uL — ABNORMAL HIGH (ref 0.1–0.8)
Neutrophils % Auto: 57.6 %
Neutrophils Abs Auto: 3.5 10*3/uL (ref 1.80–7.70)
Platelet Count: 164 10*3/uL (ref 130–400)
RDW: 13.3 UNITS (ref 0–14.7)
Red Blood Cell Count: 4.08 10*6/uL — ABNORMAL LOW (ref 4.5–5.9)
White Blood Cell Count: 6.1 10*3/uL (ref 4.5–11.0)

## 2009-11-21 LAB — CREATININE/E-GFR
Creatinine Blood: 1.08 mg/dL (ref 0.44–1.27)
E-GFR, African American: >60 SEE NOTE (ref >60)
E-GFR, Non-African American: >60 SEE NOTE (ref >60)

## 2009-11-25 ENCOUNTER — Ambulatory Visit: Admitting: Rheumatology

## 2009-11-25 VITALS — BP 115/82 | HR 88 | Temp 96.0°F | Wt 233.4 lb

## 2009-11-25 DIAGNOSIS — M4690 Unspecified inflammatory spondylopathy, site unspecified: Secondary | ICD-10-CM

## 2009-11-25 NOTE — Progress Notes (Signed)
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 The patient had a good response to the indomethacin and began exercising on a treadmill. He developed angina and was evaluated by Dr. Rachell Cipro, cardiology, who performed a catheterization. The patient is scheduled for CABG in one week.    Examination: Per homonculus; notable for tenderness of the right second and third MTP joints, and swelling of the right second toe PIP joint.    Laboratories: 2/11, CBC was notable for lymphopenia. LFTs, Cr were normal.    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 The patient will hold his Indocin in preparation for the CABG and will restart upon instruction of his cardiologist. He will return in one month to discuss DMARD therapy.

## 2009-11-25 NOTE — Nursing Note (Signed)
>>   Lavella Lemons, RN     Tue Nov 25, 2009  9:45 AM  Vital signs taken, allergies verified, pain screened and documented.  Raynelle Fanning Wineinger R.N.

## 2009-11-26 ENCOUNTER — Encounter

## 2009-11-26 ENCOUNTER — Ambulatory Visit

## 2009-11-26 NOTE — Nursing Note (Signed)
>>   Wynona Luna, LVN     Wed Nov 26, 2009  1:56 PM  Patient came in with Enoxaparin to be shown how to give himself injections.   Per order patient was shown how to given himself 80mg  of medication every 12 hours.  Patient gave himself injection without complication SQ left lower abdomen.  All questions answered.  Lot #[id number].  Wynona Luna, LVN

## 2009-12-04 ENCOUNTER — Encounter: Payer: Self-pay | Admitting: Internal Medicine

## 2009-12-04 NOTE — Telephone Encounter (Signed)
From: Sacca,Donevin   To: Aurelio Jew, MD   Sent: Sat Nov 29, 2009 11:29 AM   Subject: Non-urgent Medical Advice Question    Hi,  I just wanted to let Arline Asp ([facility])know that I brought the Levenox syringes to [facility] yesterday for pre-op and the head nurse in the cardiac support unit could not lock the needle into saftey mode either. If you would pass this along to her I would appreciate it....she was very frustrated at not being able to do it and I wanted her to know she has company. Apparently they are poorly designed. Thank you  [name] [employer]

## 2009-12-12 ENCOUNTER — Ambulatory Visit

## 2009-12-15 ENCOUNTER — Telehealth: Payer: Self-pay | Admitting: Cardiovascular Disease

## 2009-12-15 ENCOUNTER — Encounter

## 2009-12-15 LAB — HEPATIC FUNCTION PANEL
Alanine Transferase (ALT): 61 U/L (ref 6–63)
Albumin: 3.2 g/dL — ABNORMAL LOW (ref 3.4–4.8)
Alkaline Phosphatase (ALP): 80 U/L (ref 35–115)
Aspartate Transaminase (AST): 35 U/L (ref 15–43)
Bilirubin Direct: 0.1 mg/dL (ref 0.0–0.2)
Bilirubin Total: 0.8 mg/dL (ref 0.3–1.3)
Protein: 6.7 g/dL (ref 6.3–8.3)

## 2009-12-15 LAB — CBC WITH DIFFERENTIAL
Basophils % Auto: 0.6 %
Basophils Abs Auto: 0.1 10*3/uL (ref 0–0.2)
Eosinophils % Auto: 2.2 %
Eosinophils Abs Auto: 0.2 10*3/uL (ref 0–0.5)
Hematocrit: 29.7 % — ABNORMAL LOW (ref 41–53)
Hemoglobin: 9.9 GM/DL — ABNORMAL LOW (ref 13.5–17.5)
Lymphocytes % Auto: 10.8 %
Lymphocytes Abs Auto: 1 10*3/uL (ref 1.0–4.8)
MCH: 32.2 PG (ref 27–33)
MCHC: 33.3 % (ref 32–36)
MCV: 96.7 UM3 (ref 80–100)
MPV: 7.1 UM3 (ref 6.8–10.0)
Monocytes % Auto: 12.3 %
Monocytes Abs Auto: 1.2 10*3/uL — ABNORMAL HIGH (ref 0.1–0.8)
Neutrophils % Auto: 74.1 %
Neutrophils Abs Auto: 7.2 10*3/uL (ref 1.80–7.70)
Platelet Count: 499 10*3/uL — ABNORMAL HIGH (ref 130–400)
RDW: 15.9 UNITS — ABNORMAL HIGH (ref 0–14.7)
Red Blood Cell Count: 3.08 10*6/uL — ABNORMAL LOW (ref 4.5–5.9)
White Blood Cell Count: 9.7 10*3/uL (ref 4.5–11.0)

## 2009-12-15 LAB — CREATININE/E-GFR
Creatinine Blood: 1.12 mg/dL (ref 0.44–1.27)
E-GFR, African American: >60 SEE NOTE (ref >60)
E-GFR, Non-African American: >60 SEE NOTE (ref >60)

## 2009-12-15 NOTE — Telephone Encounter (Signed)
Dr.Low in clinic ,updated on patients call regarding F/U appt.  Patient called back , he had CABG x 6 at [facility] ,Dr .Langston Masker, surgeon,discharge on March 10., informed him that Dr.Low wants him to be seen 2 weeks after discharge ,Toniann Fail will give date/time for clinic appt. to be scheduled and patient will be notified about appt to confirm F/U with Dr.Low.patient is doing OK,no verbal complaints presented  Golden Hurter, RN

## 2009-12-15 NOTE — Telephone Encounter (Signed)
Patient states he had surgery at [facility] with Dr. Raelene Bott. He was discharged 12/11/09.  He would like to arrange follow up with Dr. Rachell Cipro.

## 2009-12-15 NOTE — Telephone Encounter (Signed)
Called patient,unavailable,left message to call back clinic.Golden Hurter, RN

## 2009-12-15 NOTE — Telephone Encounter (Signed)
Discussed patient with Dr. Rachell Cipro.  Dr. Rachell Cipro recommends patient have post op lab work and chest xray prior to appointment.  Patient to be seen 3/22 at 1:50.  Telephone call to patient to discuss, left message with date and time of appointment and instructions to have lab and xray prior to appointment.  Orders entered.  [facility] called for copy of discharge summary and op report.

## 2009-12-16 ENCOUNTER — Ambulatory Visit

## 2009-12-16 ENCOUNTER — Telehealth: Payer: Self-pay | Admitting: Internal Medicine

## 2009-12-16 NOTE — Telephone Encounter (Signed)
Spoke with patient made a appointment for patient to come in to see Dr. Knoepfler.      Stefanie Murphy  MA I

## 2009-12-16 NOTE — Telephone Encounter (Signed)
Hold for Stefanie Murphy  MA I

## 2009-12-16 NOTE — Telephone Encounter (Signed)
Patients wife called stating that the patient was released Thursday from his surgery and she had surgery done in February and they were told by Dr. Felicita Gage to f/u with her afterwards. Offered next open back to back appointment 04-08 but patient declined would like to know if there is anyway that Dr. Felicita Gage could fit them in prior to that day.    Please call back please advise

## 2009-12-19 ENCOUNTER — Encounter

## 2009-12-19 ENCOUNTER — Telehealth: Payer: Self-pay | Admitting: Cardiovascular Disease

## 2009-12-19 NOTE — Telephone Encounter (Signed)
Received telephone call from Dr. Rachell Cipro.  Chest xray from 12/19/09 with the following impression:    1. ANTERIOR CHEST WALL MASS ON THE LEFT AS DESCRIBED. Anterior chest wall mass measuring 5.3 cm in diameter and 2.4 cm in thickness. This is visible on the frontal projection overlying the   anterior left first and second ribs.    Dr. Rachell Cipro recommends CT Chest.  Telephone call to patient to discuss.  Will route to [facility] for scheduling.    Patient scheduled for Monday 3/21 at 2:30.  Patient advised of date and time.

## 2009-12-22 ENCOUNTER — Inpatient Hospital Stay: Admit: 2009-12-22 | Discharge: 2009-12-22

## 2009-12-23 ENCOUNTER — Encounter: Payer: Self-pay | Admitting: Cardiovascular Disease

## 2009-12-23 ENCOUNTER — Ambulatory Visit: Admitting: Cardiovascular Disease

## 2009-12-23 MED ORDER — FUROSEMIDE 20 MG TABLET
ORAL_TABLET | Freq: Every day | ORAL | Status: DC
Start: 2009-12-23 — End: 2010-04-08

## 2009-12-23 NOTE — Nursing Note (Signed)
>>   [name]     Tue Dec 23, 2009  1:11 PM  Pt. Here to follow up. Vitals taken and allergies reviewed. An Ekg was done .[name]    >> [facility]     Tue Dec 23, 2009  1:06 PM  Pt's med list updated with information provided by patient.Collier Salina LVN

## 2009-12-24 ENCOUNTER — Ambulatory Visit: Admitting: Internal Medicine

## 2009-12-24 ENCOUNTER — Encounter: Admitting: Internal Medicine

## 2009-12-24 VITALS — BP 134/81 | HR 79 | Temp 97.6°F | Resp 18 | Ht 71.0 in | Wt 232.0 lb

## 2009-12-24 DIAGNOSIS — Z951 Presence of aortocoronary bypass graft: Secondary | ICD-10-CM | POA: Insufficient documentation

## 2009-12-24 DIAGNOSIS — I4891 Unspecified atrial fibrillation: Secondary | ICD-10-CM | POA: Insufficient documentation

## 2009-12-24 MED ORDER — OXYCODONE-ACETAMINOPHEN 5 MG-325 MG TABLET
1.0000 | ORAL_TABLET | Freq: Four times a day (QID) | ORAL | Status: AC | PRN
Start: 2009-12-24 — End: 2010-03-24

## 2009-12-24 NOTE — Progress Notes (Signed)
[name] is a 59yr old male presents for follow-up multiple issues.    Since last visit has undergone CABG x6 at [facility]. 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 Fib. Currently doing well, independent in ADLs at home. Denies any significant chest pain, palpitations. Has had mild shortness of breath with exercise, no significant orthopnea or PND. Some increased swelling in the legs, saw cardiology recently, starting furosemide. Recent labs notable for anemia, stable postop, hematocrit 30. BNP elevated recently. Chest x-ray notable for" mass", subsequently noted to be postop extrapleural hematoma. No symptoms related to this. Mild sore throat and plugged ear sensation, no fever or chills. No significant increased cough or chest congestion. No significant nausea or vomiting or abdominal pain. Bowel movements normal. Urination normal. Denies any increased joint pain, continues his on indomethacin for his ankle/foot pain. No acute swelling or worsening pain. Recent diagnosis of HLA-B27 inflammatory arthropathy as well as prior history of gout.  Left hand with some decreased strength and some numbness status post surgery to the left forearm as noted above, gradually improving. Denies any other new lateralizing neurological symptoms.    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 All others negative.  I reviewed the patient's past medical and family/social history, and updated problem list.     OBJECTIVE:  BP 134/81  Pulse 79  Temp(Src) 36.4 C (97.6 F) (Tympanic)  Resp 18  Ht 1.803 m (5\' 11" )  Wt 105.235 kg (232 lb)  BMI 32.36 kg/m2  He appears well, in no apparent distress.  Alert and oriented, pleasant and cooperative.  Eye exam is normal; sclera nonicteric, conjuctiva not injected.  Ear exam shows normal external ear canals and TM's.   Oropharynx is clear without lesions or exudate.   Neck is supple and free of adenopathy or masses, the thyroid is normal without enlargement, palpable nodules or tenderness. No carotid bruits.  Chest is clear to auscultation bilaterally. No wheezing, few crackles noted at the bases only.   Heart exam shows regular rate and rhythm. No murmurs, rubs or gallops.    Moderate lower extremity edema, slightly worse on the right compared to left.  Surgical scars left forearm mid sternum and right lower extremity well healing with no evidence of secondary infection.  Mental status exam: he is alert, oriented to time, person and place. Normal thought content,  speech, affect, mood and grooming are noted.     Recent labs reviewed notable for anemia, stable chem panel, elevated BNP.  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 S/P CABG x 6  Comment: stable post op   Plan: continue follow up with Cardiology, follow up as needed     V06.1K Tdap vaccine  Comment:   Plan: TDAP VACCINE IM  (ADACEL)            V03.82H  Need for pneumococcal vaccination  Comment:   Plan: PNEUMOCOCCAL VACCINE,23-VALENT,ADULT            401.1 BENIGN ESSENTIAL HYPERTENSION  Comment: controlled   Plan: continue management     427.31N A-fib  Comment: clinically stable, in NSR now   Plan: follow up as planned, may be able to stop amiodarone if stable in 1-2 months     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 Patient verbalizes understanding of teaching and instructions.    Electronically Signed By:     Woodward Ku M.D.  Attending Physician  [facility]

## 2009-12-24 NOTE — Nursing Note (Signed)
>>   STEFANIE MURPHY     Wed Dec 24, 2009 11:39 AM  Vital signs taken, allergies verified, screened for pain, med hx taken,  screened for chicken pox, and verified immunization status.  [name], MA

## 2009-12-25 ENCOUNTER — Ambulatory Visit

## 2009-12-25 NOTE — Nursing Note (Signed)
>>   Lavella Lemons, RN     Thu Dec 25, 2009  9:27 AM  Immunization VIS documentation(s) were given to pt to review. All questions were answered and the patient consented to the Immunization(s) being given. Patient allergies were reviewed and no contraindications were found. The immunization(s) were given as ordered. The patient was observed for any immediate reactions to the vaccine. None were observed.  Adacel and Pneumovax given.  Lavella Lemons, RN

## 2009-12-29 ENCOUNTER — Encounter

## 2009-12-29 LAB — CBC WITH DIFFERENTIAL
Basophils % Auto: 0.6 %
Basophils Abs Auto: 0 10*3/uL (ref 0–0.2)
Eosinophils % Auto: 7.7 %
Eosinophils Abs Auto: 0.5 10*3/uL (ref 0–0.5)
Hematocrit: 32.8 % — ABNORMAL LOW (ref 41–53)
Hemoglobin: 11 GM/DL — ABNORMAL LOW (ref 13.5–17.5)
Lymphocytes % Auto: 19 %
Lymphocytes Abs Auto: 1.1 10*3/uL (ref 1.0–4.8)
MCH: 32.5 PG (ref 27–33)
MCHC: 33.5 % (ref 32–36)
MCV: 97.1 UM3 (ref 80–100)
MPV: 7.4 UM3 (ref 6.8–10.0)
Monocytes % Auto: 14.3 %
Monocytes Abs Auto: 0.9 10*3/uL — ABNORMAL HIGH (ref 0.1–0.8)
Neutrophils % Auto: 58.4 %
Neutrophils Abs Auto: 3.5 10*3/uL (ref 1.80–7.70)
Platelet Count: 177 10*3/uL (ref 130–400)
RDW: 17.7 UNITS — ABNORMAL HIGH (ref 0–14.7)
Red Blood Cell Count: 3.38 10*6/uL — ABNORMAL LOW (ref 4.5–5.9)
White Blood Cell Count: 6 10*3/uL (ref 4.5–11.0)

## 2009-12-29 LAB — BILIRUBIN DIRECT: Bilirubin Direct: 0.1 mg/dL (ref 0.0–0.2)

## 2010-01-06 NOTE — Progress Notes (Addendum)
December 27, 2009 RE: Joshua, Hensley    MR#: [id number]   DOB: Jun 03, 1951   Date of Service: 12/23/2009       Woodward Ku, MD  PRIMARY CARE NETWORK - [place]  [facility]    Dear Dr. Felicita Gage:    [name] returned to our Cardiology Clinic after recent coronary artery bypass graft surgery.    As you recall, he is a 59 year old gentleman from [place] who has a long history of hypertension, hyperlipidemia, and coronary artery disease. In 1995, the patient had chest pain and underwent percutaneous intervention. Over the years, he has had a total of 25 stents implanted in his coronary arteries while living in [place]. 15 of these stents ere in the right coronary artery and 10 in the left coronary system. The patient had had progressive disease by angiography at [facility] earlier this year.    After discussing all the various options, I strongly encouraged the patient to consider surgical revascularization. He consented and underwent six-vessel coronary artery bypass graft surgery at [facility] by Dr. Sumner Boast at the end of February 2011. The LMA was placed to the LAD, a free right internal mammary was placed to the diagonal branch, a left radial artery is placed to the circumflex, vein grafts were placed to the posterolateral branch and posterior descending coronary artery as well as the posterior ventricular branch of the right coronary artery. The patient tolerated the procedure well but his recovery was complicated by left forearm hematoma at the site of his radial artery harvest, and he also had paroxysmal atrial fibrillation. Ultimately, he went on to make a good recovery.    After discharge, he had a chest x-ray that showed a left upper lobe abnormality and a CT scan confirms that this is just the residual hematoma which should be resolving. The patient is doing quite well and he has no complaints. He has not had angina, shortness of breath, palpitations, dizziness or  syncope.     His present medical regimen includes Amiodarone, 200 mg daily; aspirin, 81 mg daily; atenolol, 50 mg daily; Vytorin 10/80 daily; isosorbide mononitrate, 30 mg daily; lisinopril/hydrochlorothiazide 20/25 daily; allopurinol, 300 mg daily; Protonix, 40 mg daily.    Physical exam: Pleasant male in no acute distress. Blood pressure 128/87, pulse 87, respirations 16, weight 232 pounds. Chest: Clear. Cardiovascular: Jugular venous pressure 6 cm of water. Carotid pulses normal. S4 gallop at the apex. A 1/6 systolic murmur at the lower left sternal border. Extremities: Trace to 1+ edema and left radial arm incision healing well.    Laboratory: Potassium 4.1, BUN 15, creatinine 0.86, glucose 116, BNP 503. Blood count: Hemoglobin 10.2, hematocrit 30.0.    EKG: Normal sinus rhythm, small Q-waves in 3 and AVF consistent with prior inferior wall infarct, poor R-wave progression across the precordium.     Assessment:     1. CABG times six, [facility], 02/11, Dr. Sumner Boast, doing well. Continue with antiplatelet therapy with aspirin and isosorbide mononitrate because of radial artery grafts. 2. Chest wall hematoma, resolving. Check chest x-ray and CT next visit.  3. Lower extremity edema. Furosemide, 20 mg twice weekly. 4. Hypertension, reasonable control.  5. Hyperlipidemia. Check fasting lipid profile.  6. Gout, on allopurinol.  7. History of HLA-B27 disease.  8. 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 9. Hypertension, reasonably well-compensated.  10. Paroxysmal atrial fibrillation, on Amiodarone. 11. Anemia, secondary to surgery and should be improving.  12. Left forearm hematoma, possible entrapment, resolved.    Plan:    1. Furosemide, 20 mg twice weekly.  2. Routine follow-up in six to eight weeks.  3. Laboratory studies before next visit.  4. Chest x-ray follow-up.    I appreciate this opportunity to continue in the care of [name]. Please contact me if you have any questions  regarding his operation.     Sincerely,         Chattie Greeson I Tyson Masin, MD  PROFESSOR AND CHIEF  DIVISION OF CARDIOVASCULAR MEDICINE  DEPARTMENT OF INTERNAL MEDICINE  THIS WAS ELECTRONICALLY SIGNED - 01/06/2010 4:32 PM PST BY:                   ZOX:[id number])    D: 12/27/2009 06:16 PM  T: 12/29/2009 08:35 AM  C#: [id number]    cc:   Sumner Boast, MD  CARDIAC SURGERY  [address], SECOND FLOOR  [place], [place] [address]

## 2010-01-13 ENCOUNTER — Ambulatory Visit

## 2010-01-13 ENCOUNTER — Other Ambulatory Visit: Payer: Self-pay | Admitting: Internal Medicine

## 2010-01-13 MED ORDER — ALPRAZOLAM 0.25 MG TABLET
0.2500 mg | ORAL_TABLET | Freq: Three times a day (TID) | ORAL | Status: DC | PRN
Start: 2010-01-13 — End: 2010-07-15

## 2010-01-13 NOTE — Telephone Encounter (Signed)
Prescription Refills   Pending Prescriptions Disp Refills    Alprazolam (XANAX) 0.25 mg Tab Tablet       Sig: Take 1 Tab by mouth every 8 hours if needed.     Patient has been instructed to call the pharmacy in 24 hours to check the status of this request.

## 2010-01-15 ENCOUNTER — Encounter

## 2010-01-15 LAB — CBC WITH DIFFERENTIAL
Basophils % Auto: 0.7 %
Basophils Abs Auto: 0 10*3/uL (ref 0–0.2)
Eosinophils % Auto: 3.5 %
Eosinophils Abs Auto: 0.2 10*3/uL (ref 0–0.5)
Hematocrit: 41.2 % (ref 41–53)
Hemoglobin: 13.9 GM/DL (ref 13.5–17.5)
Lymphocytes % Auto: 20.5 %
Lymphocytes Abs Auto: 1.2 10*3/uL (ref 1.0–4.8)
MCH: 32.6 PG (ref 27–33)
MCHC: 33.7 % (ref 32–36)
MCV: 96.7 UM3 (ref 80–100)
MPV: 7.7 UM3 (ref 6.8–10.0)
Monocytes % Auto: 15.9 %
Monocytes Abs Auto: 0.9 10*3/uL — ABNORMAL HIGH (ref 0.1–0.8)
Neutrophils % Auto: 59.4 %
Neutrophils Abs Auto: 3.5 10*3/uL (ref 1.80–7.70)
Platelet Count: 172 10*3/uL (ref 130–400)
RDW: 15.8 UNITS — ABNORMAL HIGH (ref 0–14.7)
Red Blood Cell Count: 4.26 10*6/uL — ABNORMAL LOW (ref 4.5–5.9)
White Blood Cell Count: 5.9 10*3/uL (ref 4.5–11.0)

## 2010-01-15 LAB — HEPATIC FUNCTION PANEL
Alanine Transferase (ALT): 39 U/L (ref 6–63)
Albumin: 4.3 g/dL (ref 3.4–4.8)
Alkaline Phosphatase (ALP): 63 U/L (ref 35–115)
Aspartate Transaminase (AST): 40 U/L (ref 15–43)
Bilirubin Direct: 0.1 mg/dL (ref 0.0–0.2)
Bilirubin Total: 0.7 mg/dL (ref 0.3–1.3)
Protein: 7.6 g/dL (ref 6.3–8.3)

## 2010-01-15 LAB — CREATININE/E-GFR
Creatinine Blood: 1.19 mg/dL (ref 0.44–1.27)
E-GFR, African American: >60 SEE NOTE (ref >60)
E-GFR, Non-African American: >60 SEE NOTE (ref >60)

## 2010-01-26 ENCOUNTER — Ambulatory Visit: Admitting: Internal Medicine

## 2010-01-26 VITALS — BP 135/91 | HR 89 | Temp 98.0°F | Resp 18 | Ht 71.0 in | Wt 231.5 lb

## 2010-01-26 NOTE — Patient Instructions (Signed)
Come in 3 times to get blood pressure checks between now and the next appointment. Please schedule appointments with the nurse for these blood pressure checks. Schedule nurse visits x3 for blood pressure checks.

## 2010-01-26 NOTE — Nursing Note (Signed)
>>   STEFANIE MURPHY     Mon Jan 26, 2010  8:16 AM  Vital signs taken, allergies verified, screened for pain, med hx taken,  screened for chicken pox, and verified immunization status.  [name], MA

## 2010-01-27 NOTE — Progress Notes (Signed)
[name] is a 59yr old male presents for follow-up blood pressure/coronary artery disease. Status post CABG, doing well, started to exercise lately on his treadmill, tolerating it well without significant chest pain or shortness of breath. Denies any orthopnea PND, previously noted myeloid shoe edema well-controlled now on 2 days low-dose Lasix. Has had no significant increased gout or inflammatory arthritis related symptoms, continues on indomethacin daily, denies increased GI side effects. Has not needed any significant pain medication in addition to this. Recent upper respiratory infection, cough, resolved. Denies fever or chills or productive cough at this time. Continues to have anxiety, controlled on regular dose benzodiazepines. Denies depression.  Had postop A. Fib, denies any significant recurrent symptoms to suggest atrial fibrillation including no significant noted palpitations both at rest or with exercise, no irregular heartbeat or fast heartbeat.    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     OBJECTIVE:  BP 135/91  Pulse 89  Temp(Src) 36.7 C (98 F) (Tympanic)  Resp 18  Ht 1.803 m (5\' 11" )  Wt  105.008 kg (231 lb 8 oz)  BMI 32.29 kg/m2  He appears well, in no apparent distress.  Alert and oriented, pleasant and cooperative.  Chest is clear to auscultation bilaterally. No wheezing, crackles or rhonchi. Normal symmetric air entry throughout both lung fields. No chest wall tenderness.  Heart exam shows regular rate and rhythm. No murmurs, rubs or gallops. No edema or JVD.  No acute joint swelling, redness noted. No rashes.   Mental status exam: he is alert, oriented to time, person and place. Normal thought content, speech, affect, mood and grooming are noted.       Impression/Plan:      414.00AE CAD (coronary artery disease)  (primary encounter diagnosis)  Comment: clinically stable status post CABG, blood pressure mildly elevated today, will have patient return for nurse blood pressure checks, continue current medications, follow-up one month, sooner if needed  Plan: Atenolol (TENORMIN) 50 mg Tablet            401.1 BENIGN ESSENTIAL HYPERTENSION  Comment: see above  Plan: Atenolol (TENORMIN) 50 mg Tablet, POTASSIUM            272.4 Other and unspecified hyperlipidemia  Comment: recent labs notable for normal liver enzymes, lipids well controlled on current meds  Plan: continue current management    790.29K Hyperglycemia  Comment: on no medication,  previously noted impaired fasting glucose, decrease alcohol, these exercise, optimize lifestyle changes, follow-up labs  Plan: HEMOGLOBIN A1C            530.81 Esophageal reflux  Comment: controlled  Plan: continue current management    274.9 GOUT UNSPECIFIED  Comment: controlled on allopurinol for now   Plan: consider discontinuing hydrochlorothiazide if able     720.9 INFLAM SPONDYLOPATHY NOS  Comment: clinically stable   Plan: continue current management, continue follow-up and monitoring as per Dr. Eusebio Me      427.31N A-fib  Comment: continues on amiodarone, consider follow-up Holter in discussing stopping medication with cardiology if continues to do  well  Plan: follow-up sooner p.r.n.      Barriers to Learning assessed: none. Patient verbalizes understanding of teaching and instructions.    Electronically Signed By:     Woodward Ku M.D.  Attending Physician  [facility]

## 2010-01-30 ENCOUNTER — Encounter

## 2010-01-30 ENCOUNTER — Telehealth: Payer: Self-pay | Admitting: Cardiovascular Disease

## 2010-01-30 LAB — CBC WITH DIFFERENTIAL
Basophils % Auto: 0.5 %
Basophils Abs Auto: 0 10*3/uL (ref 0–0.2)
Eosinophils % Auto: 2.6 %
Eosinophils Abs Auto: 0.2 10*3/uL (ref 0–0.5)
Hematocrit: 42.8 % (ref 41–53)
Hemoglobin: 14.4 GM/DL (ref 13.5–17.5)
Lymphocytes % Auto: 21.5 %
Lymphocytes Abs Auto: 1.2 10*3/uL (ref 1.0–4.8)
MCH: 32.2 PG (ref 27–33)
MCHC: 33.7 % (ref 32–36)
MCV: 95.4 UM3 (ref 80–100)
MPV: 7.5 UM3 (ref 6.8–10.0)
Monocytes % Auto: 16.6 %
Monocytes Abs Auto: 1 10*3/uL — ABNORMAL HIGH (ref 0.1–0.8)
Neutrophils % Auto: 58.8 %
Neutrophils Abs Auto: 3.4 10*3/uL (ref 1.80–7.70)
Platelet Count: 161 10*3/uL (ref 130–400)
RDW: 15.4 UNITS — ABNORMAL HIGH (ref 0–14.7)
Red Blood Cell Count: 4.48 10*6/uL — ABNORMAL LOW (ref 4.5–5.9)
White Blood Cell Count: 5.8 10*3/uL (ref 4.5–11.0)

## 2010-01-30 LAB — POTASSIUM: Potassium: 3.7 meq/L (ref 3.3–5.0)

## 2010-01-30 LAB — HEPATIC FUNCTION PANEL
Alanine Transferase (ALT): 48 U/L (ref 6–63)
Albumin: 4.4 g/dL (ref 3.4–4.8)
Alkaline Phosphatase (ALP): 58 U/L (ref 35–115)
Aspartate Transaminase (AST): 48 U/L — ABNORMAL HIGH (ref 15–43)
Bilirubin Direct: 0.1 mg/dL (ref 0.0–0.2)
Bilirubin Total: 0.7 mg/dL (ref 0.3–1.3)
Protein: 7.7 g/dL (ref 6.3–8.3)

## 2010-01-30 LAB — CREATININE/E-GFR
Creatinine Blood: 0.88 mg/dL (ref 0.44–1.27)
E-GFR, African American: >60 SEE NOTE (ref >60)
E-GFR, Non-African American: >60 SEE NOTE (ref >60)

## 2010-01-30 NOTE — Telephone Encounter (Signed)
Received email from patient.  Patient is having routine dental cleaning on 02/03/10.  Does he need anything prior to the dental work.  Also, patient is taking amiodarone for atrial fibrillation post CABG.  Patient would like to discuss the plan.  Discussed patient with both Dr. Raelene Bott and Dr. Rachell Cipro. Patient does not need anything prior to dental work, and may discontinue amiodarone 2 months post procedure.  Advised patient, he agrees with the plan.

## 2010-01-31 LAB — HEMOGLOBIN A1C
Hgb A1C,Glucose Est Avg: 111 mg/dL
Hgb A1C: 5.5 % (ref 3.9–5.6)

## 2010-02-02 ENCOUNTER — Encounter: Payer: Self-pay | Admitting: Internal Medicine

## 2010-02-02 NOTE — Telephone Encounter (Signed)
From: Bunyan,Kayle   To: Aurelio Jew, MD   SentSheral Flow Feb 02, 2010 11:56 AM   Subject: Non-urgent Medical Advice Question    Hi Dr. Felicita Gage,  I was cleared from taking anymore Amiodorone on Friday afternoon.....both Dr. Raelene Bott and Dr. Rachell Cipro agreed that 2 months was enough.  Thanks for all  [name]

## 2010-02-09 ENCOUNTER — Ambulatory Visit

## 2010-02-12 ENCOUNTER — Ambulatory Visit

## 2010-02-12 ENCOUNTER — Encounter

## 2010-02-12 LAB — CBC WITH DIFFERENTIAL
Basophils % Auto: 0.6 %
Basophils Abs Auto: 0 10*3/uL (ref 0–0.2)
Eosinophils % Auto: 3.2 %
Eosinophils Abs Auto: 0.2 10*3/uL (ref 0–0.5)
Hematocrit: 44.9 % (ref 41–53)
Hemoglobin: 15.1 GM/DL (ref 13.5–17.5)
Lymphocytes % Auto: 19.9 %
Lymphocytes Abs Auto: 1.2 10*3/uL (ref 1.0–4.8)
MCH: 32.2 PG (ref 27–33)
MCHC: 33.6 % (ref 32–36)
MCV: 95.8 UM3 (ref 80–100)
MPV: 8 UM3 (ref 6.8–10.0)
Monocytes % Auto: 17.9 %
Monocytes Abs Auto: 1.1 10*3/uL — ABNORMAL HIGH (ref 0.1–0.8)
Neutrophils % Auto: 58.4 %
Neutrophils Abs Auto: 3.6 10*3/uL (ref 1.80–7.70)
Platelet Count: 165 10*3/uL (ref 130–400)
RDW: 15.3 UNITS — ABNORMAL HIGH (ref 0–14.7)
Red Blood Cell Count: 4.68 10*6/uL (ref 4.5–5.9)
White Blood Cell Count: 6.2 10*3/uL (ref 4.5–11.0)

## 2010-02-12 LAB — CREATININE/E-GFR
Creatinine Blood: 1.04 mg/dL (ref 0.44–1.27)
E-GFR, African American: >60 SEE NOTE (ref >60)
E-GFR, Non-African American: >60 SEE NOTE (ref >60)

## 2010-02-12 LAB — HEPATIC FUNCTION PANEL
Alanine Transferase (ALT): 60 U/L (ref 6–63)
Albumin: 4.4 g/dL (ref 3.4–4.8)
Alkaline Phosphatase (ALP): 53 U/L (ref 35–115)
Aspartate Transaminase (AST): 48 U/L — ABNORMAL HIGH (ref 15–43)
Bilirubin Direct: <0.1 mg/dL (ref 0.0–0.2)
Bilirubin Total: 1 mg/dL (ref 0.3–1.3)
Protein: 7.7 g/dL (ref 6.3–8.3)

## 2010-02-12 NOTE — Nursing Note (Signed)
>>   Mamie Levers, RN     Thu Feb 12, 2010  1:52 PM  Patient here for blood pressure check.  BP 137/85; Pulse 94.  Mamie Levers, RN

## 2010-02-24 ENCOUNTER — Encounter: Payer: Self-pay | Admitting: Rheumatology

## 2010-02-25 ENCOUNTER — Encounter

## 2010-02-25 ENCOUNTER — Ambulatory Visit

## 2010-02-25 LAB — CBC WITH DIFFERENTIAL
Basophils % Auto: 0.6 %
Basophils Abs Auto: 0 10*3/uL (ref 0–0.2)
Eosinophils % Auto: 3.3 %
Eosinophils Abs Auto: 0.1 10*3/uL (ref 0–0.5)
Hematocrit: 41.6 % (ref 41–53)
Hemoglobin: 14.2 GM/DL (ref 13.5–17.5)
Lymphocytes % Auto: 31 %
Lymphocytes Abs Auto: 1.4 10*3/uL (ref 1.0–4.8)
MCH: 32 PG (ref 27–33)
MCHC: 34.1 % (ref 32–36)
MCV: 93.8 UM3 (ref 80–100)
MPV: 7.9 UM3 (ref 6.8–10.0)
Monocytes % Auto: 15.6 %
Monocytes Abs Auto: 0.7 10*3/uL (ref 0.1–0.8)
Neutrophils % Auto: 49.5 %
Neutrophils Abs Auto: 2.2 10*3/uL (ref 1.80–7.70)
Platelet Count: 167 10*3/uL (ref 130–400)
RDW: 14.3 UNITS (ref 0–14.7)
Red Blood Cell Count: 4.43 10*6/uL — ABNORMAL LOW (ref 4.5–5.9)
White Blood Cell Count: 4.5 10*3/uL (ref 4.5–11.0)

## 2010-02-25 LAB — CREATININE/E-GFR
Creatinine Blood: 0.9 mg/dL (ref 0.44–1.27)
E-GFR, African American: >60 SEE NOTE (ref >60)
E-GFR, Non-African American: >60 SEE NOTE (ref >60)

## 2010-02-25 LAB — HEPATIC FUNCTION PANEL
Alanine Transferase (ALT): 68 U/L — ABNORMAL HIGH (ref 6–63)
Albumin: 3.9 g/dL (ref 3.4–4.8)
Alkaline Phosphatase (ALP): 53 U/L (ref 35–115)
Aspartate Transaminase (AST): 56 U/L — ABNORMAL HIGH (ref 15–43)
Bilirubin Direct: <0.1 mg/dL (ref 0.0–0.2)
Bilirubin Total: 0.5 mg/dL (ref 0.3–1.3)
Protein: 6.7 g/dL (ref 6.3–8.3)

## 2010-02-25 NOTE — Nursing Note (Signed)
>>   Lavella Lemons, RN     Wed Feb 25, 2010 11:09 AM  Patient here for blood pressure check.  130/87, pulse 83 with large cuff.  Patient has been on allergy med that contains a decongestant.  He will return in 2 weeks for another blood pressure check.  Lavella Lemons, RN

## 2010-03-11 ENCOUNTER — Encounter: Payer: Self-pay | Admitting: Internal Medicine

## 2010-03-11 ENCOUNTER — Encounter

## 2010-03-11 ENCOUNTER — Ambulatory Visit: Admitting: Internal Medicine

## 2010-03-11 ENCOUNTER — Ambulatory Visit

## 2010-03-11 VITALS — BP 142/90 | HR 85 | Temp 97.4°F | Resp 18 | Ht 72.0 in | Wt 236.3 lb

## 2010-03-11 LAB — HEPATIC FUNCTION PANEL
Alanine Transferase (ALT): 53 U/L (ref 6–63)
Albumin: 4.1 g/dL (ref 3.4–4.8)
Alkaline Phosphatase (ALP): 54 U/L (ref 35–115)
Aspartate Transaminase (AST): 45 U/L — ABNORMAL HIGH (ref 15–43)
Bilirubin Direct: 0.1 mg/dL (ref 0.0–0.2)
Bilirubin Total: 0.6 mg/dL (ref 0.3–1.3)
Protein: 7.2 g/dL (ref 6.3–8.3)

## 2010-03-11 LAB — CBC WITH DIFFERENTIAL
Basophils % Auto: 0.5 %
Basophils Abs Auto: 0 10*3/uL (ref 0–0.2)
Eosinophils % Auto: 3.1 %
Eosinophils Abs Auto: 0.2 10*3/uL (ref 0–0.5)
Hematocrit: 43.3 % (ref 41–53)
Hemoglobin: 14.7 GM/DL (ref 13.5–17.5)
Lymphocytes % Auto: 26.6 %
Lymphocytes Abs Auto: 1.5 10*3/uL (ref 1.0–4.8)
MCH: 32.5 PG (ref 27–33)
MCHC: 33.8 % (ref 32–36)
MCV: 96.1 UM3 (ref 80–100)
MPV: 7.8 UM3 (ref 6.8–10.0)
Monocytes % Auto: 14.8 %
Monocytes Abs Auto: 0.8 10*3/uL (ref 0.1–0.8)
Neutrophils % Auto: 55 %
Neutrophils Abs Auto: 3.1 10*3/uL (ref 1.80–7.70)
Platelet Count: 158 10*3/uL (ref 130–400)
RDW: 14.7 UNITS (ref 0–14.7)
Red Blood Cell Count: 4.51 10*6/uL (ref 4.5–5.9)
White Blood Cell Count: 5.6 10*3/uL (ref 4.5–11.0)

## 2010-03-11 LAB — CREATININE/E-GFR
Creatinine Blood: 1.07 mg/dL (ref 0.44–1.27)
E-GFR, African American: >60 SEE NOTE (ref >60)
E-GFR, Non-African American: >60 SEE NOTE (ref >60)

## 2010-03-11 MED ORDER — LISINOPRIL 20 MG TABLET
20.0000 mg | ORAL_TABLET | Freq: Every day | ORAL | Status: DC
Start: 2010-03-11 — End: 2010-04-09

## 2010-03-11 NOTE — Nursing Note (Signed)
>>   [name]     Wed Mar 11, 2010  8:06 AM  Vital signs taken, allergies verified, screened for pain, med hx taken,  screened for chicken pox, and verified immunization status.  [name], MA

## 2010-03-11 NOTE — Telephone Encounter (Signed)
From: Newey,Jessy   To: Aurelio Jew, MD   Sent: Wed Mar 11, 2010 11:28 AM   Subject: Non-urgent Medical Advice Question    Hi, [facility] said they needed to talk to Dr. Felicita Gage....not that didn't receive the Rx.Marland KitchenMarland KitchenMarland KitchenMarland Kitchenmy bad....sorry!  I think this is the number [phone]: # [id number]  [name]

## 2010-03-11 NOTE — Progress Notes (Signed)
[name] is a 59yr old male presents for follow-up blood pressure in the setting of known history of coronary disease status post CABG, hyperlipidemia and hypertension.  Has been trying to exercise more, good exercise tolerance, improving. Trying to lose weight. Blood pressure checks with the nurse notable for borderline to mildly elevated blood pressure. Reports compliance with current medications. Had been on lisinopril prior to CABG, stopped at time of surgery/discharge. Most recent lipids well controlled, previously on both Vytorin and trilipix. This was also changed post surgery. The pain has been stable, continues on anti-inflammatory medication with periodic lab monitoring per Dr. Eusebio Me. Unfortunately has had slightly increasing liver enzymes on the last set of labs. Denies abdominal symptoms, abdominal pain. The patient denies anginal chest pain, dyspnea, increased edema, or TIA's.  Has noted some musculoskeletal chest pain, positional, notable more with different types of activity like fishing recently.     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  Alert and oriented, pleasant and cooperative.  Chest is clear to auscultation bilaterally. No wheezing, crackles or rhonchi. Normal symmetric air entry throughout both lung fields. Mild chest wall tenderness. Midline sternal scar well-healed.  Heart exam shows regular rate and rhythm. No murmurs, rubs or gallops. No edema or JVD.      Impression/Plan:      401.1 BENIGN ESSENTIAL HYPERTENSION  (primary encounter diagnosis)  Comment: suboptimal control  Add lisinopril, continue hydrochlorothiazide  Plan: follow-up one month    272.4 Other and unspecified hyperlipidemia  Comment: Previous adequate control, and had some increased weight, we'll update labs and current meds  Plan: LIPID PANEL WITH DLDL REFLEX            414.00AE CAD (coronary artery disease)  Comment: clinically stable  Chest wall pain, discussed some gentle stretching  Continue current management  Plan: LIPID PANEL WITH DLDL REFLEX        Follow up as planned, sooner as needed       Barriers to Learning assessed: none. Patient verbalizes understanding of teaching and instructions.    Electronically Signed By:     Woodward Ku M.D.  Attending Physician  [facility]

## 2010-04-01 ENCOUNTER — Ambulatory Visit

## 2010-04-01 ENCOUNTER — Encounter

## 2010-04-01 LAB — HEPATIC FUNCTION PANEL
Alanine Transferase (ALT): 55 U/L (ref 6–63)
Albumin: 4 g/dL (ref 3.4–4.8)
Alkaline Phosphatase (ALP): 46 U/L (ref 35–115)
Aspartate Transaminase (AST): 48 U/L — ABNORMAL HIGH (ref 15–43)
Bilirubin Direct: 0.1 mg/dL (ref 0.0–0.2)
Bilirubin Total: 0.8 mg/dL (ref 0.3–1.3)
Protein: 7.1 g/dL (ref 6.3–8.3)

## 2010-04-01 LAB — CREATININE/E-GFR
Creatinine Blood: 1 mg/dL (ref 0.44–1.27)
E-GFR, African American: >60 SEE NOTE (ref >60)
E-GFR, Non-African American: >60 SEE NOTE (ref >60)

## 2010-04-01 LAB — CBC WITH DIFFERENTIAL
Basophils % Auto: 0.4 %
Basophils Abs Auto: 0 10*3/uL (ref 0–0.2)
Eosinophils % Auto: 2.1 %
Eosinophils Abs Auto: 0.1 10*3/uL (ref 0–0.5)
Hematocrit: 44.3 % (ref 41–53)
Hemoglobin: 14.8 GM/DL (ref 13.5–17.5)
Lymphocytes % Auto: 24.1 %
Lymphocytes Abs Auto: 1.7 10*3/uL (ref 1.0–4.8)
MCH: 32 PG (ref 27–33)
MCHC: 33.5 % (ref 32–36)
MCV: 95.5 UM3 (ref 80–100)
MPV: 8.2 UM3 (ref 6.8–10.0)
Monocytes % Auto: 16.8 %
Monocytes Abs Auto: 1.2 10*3/uL — ABNORMAL HIGH (ref 0.1–0.8)
Neutrophils % Auto: 56.6 %
Neutrophils Abs Auto: 3.9 10*3/uL (ref 1.80–7.70)
Platelet Count: 155 10*3/uL (ref 130–400)
RDW: 14.9 UNITS — ABNORMAL HIGH (ref 0–14.7)
Red Blood Cell Count: 4.64 10*6/uL (ref 4.5–5.9)
White Blood Cell Count: 6.9 10*3/uL (ref 4.5–11.0)

## 2010-04-01 NOTE — Nursing Note (Signed)
>>   Lavella Lemons, RN     Wed Apr 01, 2010  8:37 AM  Patient here for blood pressure check.  Had labs drawn this AM.  See vitals flow sheet.  Lavella Lemons, RN

## 2010-04-08 ENCOUNTER — Ambulatory Visit: Admitting: Internal Medicine

## 2010-04-08 ENCOUNTER — Ambulatory Visit

## 2010-04-08 VITALS — BP 132/78 | HR 72

## 2010-04-08 MED ORDER — PANTOPRAZOLE 40 MG TABLET,DELAYED RELEASE
1.0000 | DELAYED_RELEASE_TABLET | Freq: Two times a day (BID) | ORAL | Status: DC
Start: 2010-04-08 — End: 2010-11-11

## 2010-04-08 MED ORDER — EZETIMIBE 10 MG-SIMVASTATIN 40 MG TABLET
1.0000 | ORAL_TABLET | Freq: Every day | ORAL | Status: DC
Start: 2010-04-08 — End: 2010-12-23

## 2010-04-08 MED ORDER — LISINOPRIL 20 MG-HYDROCHLOROTHIAZIDE 25 MG TABLET
1.0000 | ORAL_TABLET | Freq: Every day | ORAL | Status: DC
Start: 2010-04-08 — End: 2010-12-17

## 2010-04-08 MED ORDER — ATENOLOL 50 MG TABLET
50.0000 mg | ORAL_TABLET | Freq: Two times a day (BID) | ORAL | Status: DC
Start: 2010-04-08 — End: 2010-07-15

## 2010-04-08 MED ORDER — ALLOPURINOL 300 MG TABLET
1.0000 | ORAL_TABLET | Freq: Every day | ORAL | Status: DC
Start: 2010-04-08 — End: 2010-11-11

## 2010-04-09 NOTE — Progress Notes (Signed)
[name] is a 59yr old male Presents for follow-up multiple issues. Generally doing well since last visit. Her pressure has been under good control. Tolerating medication without difficulty, no significant medication side effects. Denies chest pain either at rest or with exertion, increasing exercise tolerance without difficulty, no significant shortness of breath, orthopnea or PND. No increased lower extreme edema. Has stopped using diuretic without significant increased edema. No recurrent gout symptoms. Continues allopurinol, no medication side effects. Needs medication refills. Continues on indomethacin, no recurrent inflammatory arthritis symptoms on medication. Recent labs stable, normal renal and liver function.Has known history of acid reflux/Barrett's, has been able to tolerate NSAIDs without significant increased GI symptoms, continues on PPI.  Had postop A. Fib, and off amiodarone now, no recurrent symptoms or palpitations. No lateralizing neurological symptoms to suggest TIA or CVA.  Also due for repeat colonoscopy, previous study done in [place], multiple polyps including villus adenoma by report. Advised to have follow-up in 3 years. Denies any change in bowel movements, melena or hematochezia.    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  Alert and oriented, pleasant and cooperative.  Chest is clear to auscultation bilaterally. No wheezing, crackles or rhonchi. Normal symmetric air entry throughout both lung fields. No chest wall tenderness.  Heart exam shows regular rate and rhythm. No murmurs, rubs or gallops. No edema or JVD.  Foot exam negative for significant joint inflammation/redness or swelling  Mental status exam: he is alert, oriented to time, person and place. Normal thought content, speech, affect, mood and grooming are noted.     Impression/Plan:      274.9 Gout, unspecified  (primary encounter diagnosis)  Comment: Controlled, continue current management  Advised to follow-up with Dr Eusebio Me regarding long-term plan for NSAID use/alternative long-term medication  Plan: Allopurinol (ZYLOPRIM) 300 mg Tablet            401.1 BENIGN ESSENTIAL HYPERTENSION  Comment: Controlled, continue current medications, change to combination Zestoretic for simplicity  Plan: Atenolol (TENORMIN) 50 mg Tablet, POTASSIUM            272.4 Other and unspecified hyperlipidemia  Comment: Stable, controlled  Plan:  Recently well controlled, continue current meds    414.00AE CAD (coronary artery disease)  Comment: stable; No significant anginal symptoms, continue current meds  Plan: Atenolol (TENORMIN) 50 mg Tablet  530.85 Barrett's esophagus  Comment: Stable, continue medications  Plan: Pantoprazole (PROTONIX) 40 mg Delayed Release         Tablet            790.21A Impaired fasting blood sugar  Comment: Diet controlled at this point, continue exercise and weight loss, periodic followup  Plan: HEMOGLOBIN A1C            211.3 Benign neoplasm of colon  Comment: Due for repeat colonoscopy, referred  Plan: GASTROENTEROLOGY REFERRAL              Barriers to Learning assessed: none. Patient  verbalizes understanding of teaching and instructions.    Electronically Signed By:     Woodward Ku M.D.  Attending Physician  [facility]

## 2010-04-21 ENCOUNTER — Encounter

## 2010-04-23 ENCOUNTER — Encounter

## 2010-04-23 LAB — CREATININE/E-GFR
Creatinine Blood: 0.97 mg/dL (ref 0.44–1.27)
E-GFR, African American: >60 SEE NOTE (ref >60)
E-GFR, Non-African American: >60 SEE NOTE (ref >60)

## 2010-04-23 LAB — CBC WITH DIFFERENTIAL
Basophils % Auto: 0.6 %
Basophils Abs Auto: 0 10*3/uL (ref 0–0.2)
Eosinophils % Auto: 2.5 %
Eosinophils Abs Auto: 0.1 10*3/uL (ref 0–0.5)
Hematocrit: 39.6 % — ABNORMAL LOW (ref 41–53)
Hemoglobin: 13.4 GM/DL — ABNORMAL LOW (ref 13.5–17.5)
Lymphocytes % Auto: 26.3 %
Lymphocytes Abs Auto: 1.1 10*3/uL (ref 1.0–4.8)
MCH: 32.6 PG (ref 27–33)
MCHC: 34 % (ref 32–36)
MCV: 95.9 UM3 (ref 80–100)
MPV: 8.3 UM3 (ref 6.8–10.0)
Monocytes % Auto: 13 %
Monocytes Abs Auto: 0.6 10*3/uL (ref 0.1–0.8)
Neutrophils % Auto: 57.6 %
Neutrophils Abs Auto: 2.5 10*3/uL (ref 1.80–7.70)
Platelet Count: 115 10*3/uL — ABNORMAL LOW (ref 130–400)
RDW: 16.1 UNITS — ABNORMAL HIGH (ref 0–14.7)
Red Blood Cell Count: 4.13 10*6/uL — ABNORMAL LOW (ref 4.5–5.9)
White Blood Cell Count: 4.3 10*3/uL — ABNORMAL LOW (ref 4.5–11.0)

## 2010-04-23 LAB — HEPATIC FUNCTION PANEL
Alanine Transferase (ALT): 103 U/L — ABNORMAL HIGH (ref 6–63)
Albumin: 3.7 g/dL (ref 3.4–4.8)
Alkaline Phosphatase (ALP): 53 U/L (ref 35–115)
Aspartate Transaminase (AST): 109 U/L — ABNORMAL HIGH (ref 15–43)
Bilirubin Direct: 0.1 mg/dL (ref 0.0–0.2)
Bilirubin Total: 0.4 mg/dL (ref 0.3–1.3)
Protein: 6.3 g/dL (ref 6.3–8.3)

## 2010-04-27 ENCOUNTER — Other Ambulatory Visit: Payer: Self-pay | Admitting: Internal Medicine

## 2010-04-27 MED ORDER — FLUTICASONE PROPIONATE 50 MCG/ACTUATION NASAL SPRAY,SUSPENSION
2.0000 | Freq: Every day | NASAL | Status: DC
Start: 2010-04-27 — End: 2011-09-24

## 2010-04-28 ENCOUNTER — Ambulatory Visit: Admitting: Cardiovascular Disease

## 2010-04-28 ENCOUNTER — Inpatient Hospital Stay: Admit: 2010-04-28 | Discharge: 2010-04-28

## 2010-04-28 ENCOUNTER — Ambulatory Visit

## 2010-04-28 NOTE — Nursing Note (Signed)
>>   [facility]     Tue Apr 28, 2010  4:33 PM    48h holter monitor,[id number], placed on patient at [facility] clinic per order. Patient education provided and event diary given. Patient advised to return holter to clinic 04/30/10. [place], [place]    >> IN[facility]     Tue Apr 28, 2010  3:02 PM  Vitals signs taken,screened for pain,allergies verified, pharmacy verified, EKG done. [place], [place]     >> [facility]     Tue Apr 28, 2010  2:39 PM  Pt's med list updated with information provided by patient.Collier Salina LVN

## 2010-05-05 ENCOUNTER — Ambulatory Visit

## 2010-05-07 ENCOUNTER — Ambulatory Visit

## 2010-05-08 ENCOUNTER — Other Ambulatory Visit: Payer: Self-pay | Admitting: Rheumatology

## 2010-05-08 ENCOUNTER — Encounter

## 2010-05-08 LAB — HEPATIC FUNCTION PANEL
Alanine Transferase (ALT): 80 U/L — ABNORMAL HIGH (ref 6–63)
Albumin: 4.3 g/dL (ref 3.4–4.8)
Alkaline Phosphatase (ALP): 51 U/L (ref 35–115)
Aspartate Transaminase (AST): 68 U/L — ABNORMAL HIGH (ref 15–43)
Bilirubin Direct: 0.1 mg/dL (ref 0.0–0.2)
Bilirubin Total: 1 mg/dL (ref 0.3–1.3)
Protein: 7.2 g/dL (ref 6.3–8.3)

## 2010-05-08 NOTE — Progress Notes (Signed)
Hepatic function panel ordered due to abnormal lab and Dr. Eusebio Me my chart message to patient to repeat.  Lavella Lemons, RN

## 2010-06-12 ENCOUNTER — Ambulatory Visit

## 2010-06-12 ENCOUNTER — Ambulatory Visit: Admitting: Rheumatology

## 2010-06-12 VITALS — BP 108/64 | HR 74 | Temp 98.5°F | Wt 226.0 lb

## 2010-06-12 DIAGNOSIS — M4690 Unspecified inflammatory spondylopathy, site unspecified: Secondary | ICD-10-CM

## 2010-06-12 NOTE — Progress Notes (Signed)
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 The patient has done well despite discontinuing the indomethacin in July for elevated liver function tests. He has not had any notable joint pains or stiffness, and his activities of daily living are full. He has not had any GI or GU symptoms.    Examination: Per homunculus; notable for absence of synovitis.    Laboratories: 8/11, CBC was notable for lymphopenia. LFTs were notable for AST 68, ALT 80. Cr was normal.    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 He may restart the medication with symptoms following which monitoring laboratory tests will be required. The patient will otherwise return in 6 months.

## 2010-06-12 NOTE — Nursing Note (Signed)
>>   Ann Maki, MA     Caleen Essex Jun 12, 2010 10:56 AM  vitals taken, allergies and medications reviewed, pain level recorded.  Diane Joya Gaskins MAII

## 2010-06-24 ENCOUNTER — Encounter

## 2010-06-29 ENCOUNTER — Encounter: Payer: Self-pay | Admitting: Internal Medicine

## 2010-06-29 MED ORDER — SILDENAFIL 50 MG TABLET
1.0000 | ORAL_TABLET | Freq: Every day | ORAL | Status: DC | PRN
Start: 2010-06-29 — End: 2011-08-13

## 2010-06-29 NOTE — Telephone Encounter (Signed)
From: Kohn,Alim   To: Aurelio Jew, MD   SentSheral Flow Jun 29, 2010 8:25 AM   Subject: Non-urgent Medical Advice Question    Hi Dr. Felicita Gage,  Can you please call in an Rx for Viagra to [facility] in [facility]? My last one expired over a year ago. Thank you very much.  regards,  [name] [employer]

## 2010-07-01 ENCOUNTER — Encounter

## 2010-07-15 ENCOUNTER — Ambulatory Visit

## 2010-07-15 ENCOUNTER — Ambulatory Visit: Admitting: Internal Medicine

## 2010-07-15 VITALS — BP 133/86 | HR 84 | Temp 97.7°F | Resp 18 | Wt 231.2 lb

## 2010-07-15 DIAGNOSIS — I493 Ventricular premature depolarization: Secondary | ICD-10-CM | POA: Insufficient documentation

## 2010-07-15 MED ORDER — ATENOLOL 50 MG TABLET
75.0000 mg | ORAL_TABLET | Freq: Two times a day (BID) | ORAL | Status: DC
Start: 2010-07-15 — End: 2010-11-11

## 2010-07-15 MED ORDER — ALPRAZOLAM ER 0.5 MG TABLET,EXTENDED RELEASE 24 HR
0.5000 mg | EXTENDED_RELEASE_TABLET | Freq: Every day | ORAL | Status: DC
Start: 2010-07-15 — End: 2010-08-07

## 2010-07-15 MED ORDER — ROSUVASTATIN 20 MG TABLET
20.0000 mg | ORAL_TABLET | Freq: Every day | ORAL | Status: DC
Start: 2010-07-15 — End: 2010-11-11

## 2010-07-15 NOTE — Nursing Note (Signed)
>>   [name]     Wed Jul 15, 2010  5:03 PM  The Inactivated Influenza Vaccine VIS document was given to patient to review and any questions were referred to the physician. The Influenza Vaccine was then administered per protocol. The patient was observed for immediate reactions to the vaccine per protocol. None were observed.   [name]     >> [name]     Wed Jul 15, 2010  8:11 AM  Vital signs taken, allergies verified, screened for pain, med hx taken,  screened for chicken pox, and verified immunization status.  [name], MA

## 2010-07-15 NOTE — Progress Notes (Signed)
[name] is a 59yr old male presents for follow-up. Since last visit has noted progressively worsening tremor, much worse after exercise, later in the day. Improved with one alcoholic drink, typically around lunchtime. Also drinking in the evening, baseline for him.  Denies any other lateralizing neurological symptoms. Finds it difficulty to functional issues, difficulty to right with tremor. Reports family history of essential tremor, multiple relatives. Not aware of what his relatives use for medication treatment. He is currently on low-dose beta blocker for coronary artery disease management, hypertension, tolerating medication without side effects.    Recent Holter monitor notable for PACs, PVCs, only one reported episode of trigeminy. No correlation with symptoms. Denies significant increased symptoms with or without exercise, no associated shortness of breath or anginal symptoms. Denies any increased lower extremity edema, orthopnea PND.    Recent labs, reviewed with patient. Improved average glucose control, not quite optimal lipid control. Had significant increased myalgias with Lipitor, currently on Vytorin. No side effects on Vytorin.    Recent labs also notable for increased liver enzymes, presumed secondary to indomethacin effect, stable, slightly improved.     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  Alert and oriented, pleasant and cooperative.  Chest is clear to auscultation  Heart regular rate  Lower extremities but no pitting edema  Neurological exam notable for fine resting tremor consistent with essential tremor, no other focal motor or neurological abnormalities      Impression/Plan:      333.1B Benign essential tremor  (primary encounter diagnosis)  Comment: Likely hereditary, discussed, currently on beta blocker, tolerating it without side effects, we'll try to increase the dose slightly  Plan: consider further evaluation/neurology consultation, alternative medications pending response  Discussed avoid increased alcohol use      401.1 BENIGN ESSENTIAL HYPERTENSION  Comment: increase atenolol as per orders, continue other meds  Plan: Atenolol (TENORMIN) 50 mg Tablet            272.4 Other and unspecified hyperlipidemia  Comment: not quite at goal of LDL of 70, discussed  Plan: Atenolol (TENORMIN) 50 mg Tablet, LIPID PANEL         WITH DLDL REFLEX, HEPATIC FUNCTION PANEL  Trial of Crestor, follow-up labs    414.00AE CAD (coronary artery disease)  Comment: clinically stable  Plan: Atenolol (TENORMIN) 50 mg Tablet, LIPID PANEL         WITH DLDL REFLEX, HEPATIC FUNCTION PANEL              V04.81 Need for prophylactic vaccination and inoculation against influenza  Comment:     Plan: INFLUENZA VACCINE (3 YO AND OLDER), LATEX , NO         PRESERVATIVE              427.69AM Premature ventricular contractions (PVCs) (VPCs)  Comment: holter monitor results reviewed  Plan: clinically stable, will continue to follow-up clinically, follow-up  with cardiology as planned in a few months, sooner p.r.n.      Barriers to Learning assessed: none. Patient verbalizes understanding of teaching and instructions.    Electronically Signed By:     Woodward Ku M.D.  Attending Physician  [facility]

## 2010-07-16 ENCOUNTER — Encounter: Payer: Self-pay | Admitting: Internal Medicine

## 2010-07-16 NOTE — Telephone Encounter (Signed)
From: Ohms,Linwood   To: Aurelio Jew, MD   Sent: Wed Jul 15, 2010 12:43 PM   Subject: Non-urgent Medical Advice Question    Hi [name] is getting her flu shot at her office on 10/20...Marland KitchenMarland Kitchenthanks for thinking of her and I hope you are feeling better.....  [name]

## 2010-07-20 ENCOUNTER — Encounter: Payer: Self-pay | Admitting: Internal Medicine

## 2010-07-20 NOTE — Telephone Encounter (Signed)
Patient scheduled at this time.  Azka Steger M.D.

## 2010-07-20 NOTE — Telephone Encounter (Signed)
From: Kleinschmidt,Ajmal   To: Aurelio Jew, MD   Sent: Caleen Essex Jul 17, 2010 6:50 PM   Subject: Non-urgent Medical Advice Question    Hi Dr. Felicita Gage,  Another day in the life of [name].....another challenge for Doctor K :)  I need to 'reach out' so whenever we can meet works for me.....  Thanks,  Joshua

## 2010-07-20 NOTE — Telephone Encounter (Signed)
Please call patient and arrange for an appointment.  Moriah Shawley M.D.

## 2010-07-27 ENCOUNTER — Ambulatory Visit: Admitting: Internal Medicine

## 2010-07-27 VITALS — BP 126/82 | HR 79 | Temp 97.1°F | Resp 18 | Wt 232.9 lb

## 2010-07-27 DIAGNOSIS — F4321 Adjustment disorder with depressed mood: Secondary | ICD-10-CM | POA: Insufficient documentation

## 2010-07-27 NOTE — Progress Notes (Signed)
SUBJECTIVE: [name] is a 59yr old male who comes in today to discuss problems with depression which have been present for the past 2 month(s) or more, worsening.  His current symptoms include depressed mood, anhedonia, hypersomnia and fatigue. He denies feelings of worthlessness/guilt, hopelessness and recurrent thoughts of death. Since the onset of these problems, he states the symptoms are gradually worsening.    Still exercises daily.  Tremor improved. 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    FAMILY HX GI MALIGNANCY V16.0    GOUT UNSPECIFIED 274.9    BENIGN ESSENTIAL HYPERTENSION 401.1    OTHER AND UNSPECIFIED HYPERLIPIDEMIA 272.4    ANXIETY STATE UNSPECIFIED 300.00    Gout 274.9E    Impaired fasting blood sugar 790.21A    INFLAM SPONDYLOPATHY NOS 720.9    Angina 413.9L    CAD (coronary artery disease) 414.00AE    Anxiety 300.00E    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  Organic causes of depression present: alcohol intake although not new     Family history of depression, bipolar disorder, and other mental illness: none.     Previous treatment modalities employed include none.     Other problems: none    OBJECTIVE: BP 126/82  Pulse 79  Temp(Src) 36.2 C (97.1 F) (Tympanic)  Resp 18  Wt 105.643 kg (232 lb 14.4 oz)  He appears well, in no apparent distress.  Alert and oriented,  pleasant and cooperative.  Mental Status: Depressed mood but good insight, no evidence of thought disorder or hallucination  Affect is slightly sad.           Impression/Plan:      309.0 Adjustment disorder with depressed mood  (primary encounter diagnosis)  Comment: discussed at length   Multiple changes in the last 2 yrs, both personal and health related  Recommend therapy; handout provided   Supportive counseling   Plan: follow up one months, consider medication if worsening, not improving with above     I spent with the patient, 25 minutes of which were spent counseling and coordinating care as noted above.        Barriers to Learning assessed: none. Patient verbalizes understanding of teaching and instructions.    Electronically Signed By:     Woodward Ku M.D.  Attending Physician  [facility]

## 2010-07-27 NOTE — Nursing Note (Signed)
>>   STEFANIE MURPHY     Mon Jul 27, 2010  8:05 AM  Vital signs taken, allergies verified, screened for pain, med hx taken,  screened for chicken pox, and verified immunization status.  [name], MA

## 2010-07-27 NOTE — Patient Instructions (Signed)
Call behavioral health number on insurance card to obtain information on covered providers.     Some of the Therapists/ Psychologists recommended by other providers and patients:      Tyler Aas, [phone]  [name],  [phone]  Andria Meuse, [phone]  Ian Bushman,  [phone] or [phone], [phone]  Theora Gianotti, [phone]  Emelda Fear  [phone]  Simon Rhein [phone]

## 2010-08-07 ENCOUNTER — Other Ambulatory Visit: Payer: Self-pay | Admitting: Internal Medicine

## 2010-08-07 MED ORDER — ALPRAZOLAM 0.25 MG TABLET
1.0000 | ORAL_TABLET | Freq: Three times a day (TID) | ORAL | Status: DC | PRN
Start: 2010-08-07 — End: 2010-09-21

## 2010-08-07 NOTE — Telephone Encounter (Signed)
Patient called back, informed of message below, expressed understanding of directions will pick up prescription and discuss with Dr. Felicita Gage when he comes in for appointment.    Thank you,    Randie Bloodgood  Mosc II

## 2010-08-07 NOTE — Telephone Encounter (Signed)
Prescription Refills   Pending Prescriptions Disp Refills    Alprazolam (XANAX XR) 0.5 mg XR tablet 30 Tab 2     Sig: Take 1 Tab by mouth every morning.     [name] is calling to request that his Xanax prescription be written for 0.25 mg not the above mg as this puts him to sleep.    He is out of this medication and needs to pick up this afternoon.    Please call for any questions or to advise of when to come in this afternoon to pick up.

## 2010-08-07 NOTE — Telephone Encounter (Signed)
Sustained release alprazolam which is xanax xr does not come in 0.25 mg dose.   For now I sent in the regular alprazolam 0.25 mg tabs and may use this 1-3 times daily as needed for anxiety until appointment with me in a few weeks as scheduled for 11/21  Caston Coopersmith M.D.

## 2010-08-10 ENCOUNTER — Other Ambulatory Visit: Payer: Self-pay | Admitting: Gastroenterology

## 2010-08-10 ENCOUNTER — Encounter: Payer: Self-pay | Admitting: Gastroenterology

## 2010-08-10 ENCOUNTER — Ambulatory Visit: Admitting: Gastroenterology

## 2010-08-10 ENCOUNTER — Ambulatory Visit

## 2010-08-10 HISTORY — PX: PR COLONOSCOPY FLX DX W/COLLJ SPEC WHEN PFRMD: 45378

## 2010-08-10 MED ORDER — MEPERIDINE 100 MG/ML INJECTION SOLUTION
100.0000 mg | INTRAMUSCULAR | Status: AC
Start: 2010-08-10 — End: 2010-08-11

## 2010-08-10 MED ORDER — DIPHENHYDRAMINE 50 MG/ML INJECTION SOLUTION
INTRAMUSCULAR | Status: AC
Start: 2010-08-10 — End: 2010-08-11

## 2010-08-10 MED ORDER — SODIUM CHLORIDE 0.9 % INTRAVENOUS SOLUTION
INTRAVENOUS | Status: AC
Start: 2010-08-10 — End: 2010-08-11

## 2010-08-10 MED ORDER — MIDAZOLAM 1 MG/ML INJECTION SOLUTION
INTRAMUSCULAR | Status: AC
Start: 2010-08-10 — End: 2010-08-11

## 2010-08-10 NOTE — Progress Notes (Signed)
EPICSP GIVISIT MR#: [id number]   Date of Birth: 1951/08/29   Date of Service: 08/10/2010    PROCEDURE: Screening colonoscopy with biopsy    REFERRING PHYSICIAN: Anca Rolland Porter, MD    ENDOSCOPIST: Debbe Mounts. Darryl Lent, MD    ASSISTANT(S): Karen Chafe, RN    ENDOSCOPE(S):  Olympus [id number]    INDICATION: 12yr male with HTN, CAD, GERD, previous colon polyps (last colonoscopy 11/2006 in [place]) and FH colon cancer who presents for colorectal cancer screening.    PERSONAL HISTORY OF COLON CANCER OR POLYPS: Yes, as above      FAMILY HISTORY OF COLON CANCER: Yes (mother in her 6s)     MEDICATIONS ADMINISTERED:  5 mg versed IV, 125 mg demerol IV, 50 mg diphenhydramine IV, phenergan 12.5mg  IV given in divided doses throughout the procedure    EXTENT OF EXAMINATION:  cecum      SCOPE INSERTION TIME: 1044    SCOPE REMOVAL TIME: 1107    BOWEL PREPARATION: Fair    LIMITATIONS OF EXAM: Fair prep    COMPLICATIONS: None    PROCEDURE DESCRIPTION:    A brief history and physical exam were performed.  Informed consent was obtained from the patient after explaining the potential risks (infection, bleeding, bowel perforation, pain, adverse reactions to the medications, cardiopulmonary risks, splenic laceration, incomplete examination, missed lesions, intravenous site complications, death), benefits, and alternatives to the procedure.  The patient was given an opportunity to ask any questions, which were answered.  Informed consent was then obtained from the patient.  The patient's medical and medication history, physical status (ASA class), the results of relevant diagnostic studies and choice of sedation and procedure were assessed prior to the administration of intravenous sedation.  The patient was connected to the cardiac monitoring devices and continuous oxygen was provided via nasal cannula.  The patient was then placed in the left lateral decubitus position and IV medicines were administered through an indwelling  catheter.  The patient's pulse, blood pressure, O2 saturation were monitored and documented by the physician and nursing staff throughout the entire procedure.      After adequate conscious sedation was achieved, a digital rectal examination was performed.  The endoscope was inserted into the rectum and advanced under direct visualization to the cecum, which was identified by the appendiceal orifice and the ileocecal valve.  The endoscope was then withdrawn slowly, examining the mucosa carefully in a circumferential manner.  The quality of the preparation was fair.  Significant washing and suctioning was performed.  Small, flat, or depressed lesions may be missed.      Retroflexion was performed in the rectum and the colonoscope was withdrawn. The patient tolerated the procedure well and there were no immediate complications.  The patient was transferred to the recovery area in stable condition and with appropriate post-anesthesia care.      FINDINGS:  The digital rectal exam was normal.  Visualized mucosa throughout the cecum (including appendiceal orifice and ileocecal valve) appeared normal.  A 3-3mm sessile polyp was found in the ascending colon.  It was removed via cold forceps biopsy, retrieved and sent to pathology.  A 2mm flat polyp was found in the transverse colon.  It was removed via cold forceps biopsy, retrieved and sent to pathology.  A 1-69mm flat polyp was found in the sigmoid colon.  It was removed via cold forceps biopsy, retrieved and sent to pathology.  Visualized mucosa throughout the descending colon and rectum appeared normal.  Upon retroflexion, small  internal hemorrhoids were seen.  A single diverticulum was seen in the sigmoid colon.  The scope was then completely withdrawn from the patient and the procedure terminated.    IMPRESSION:   1. Mild sigmoid colon diverticulosis  2. Small internal hemorrhoids  3. 3-65mm sessile ascending polyp, 2mm flat transverse colon polyp and 1-30mm flat sigmoid  colon polyp, all removed via cold forceps biopsy  4. Fair preparation with aggressive washing/suctioning performed; small, flat, or depressed lesions may be missed    RECOMMENDATIONS:  1. Repeat colonoscopy in 2 years with 2-day preparation given fair preparation on this exam and multiple polyps seen  2. Await biopsy results  3. High fiber diet, fiber supplementation    Electronically signed by:  Jehan Ranganathan C. Darryl Lent, MD  Attending Physician, Division of Gastroenterology & Hepatology  PI [phone]

## 2010-08-10 NOTE — Progress Notes (Signed)
PRE-PROCEDURE HISTORY AND PHYSICAL EXAM    Referring provide: Anca Rolland Porter, MD    Procedure: Colonoscopy    Subjective:  HPI: [name] is a 23yr male with HTN, CAD, GERD, previous colon polyps (last colonoscopy 11/2006 in [place]) and FH colon cancer who presents for colorectal cancer screening.    I reviewed all the available medical/surgical/social and family history as well as medications and allergies.    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  90 Tab  3    Alprazolam (XANAX) 0.25 mg Tab Tablet Take 1 Tab by mouth three times daily if needed.  90 Tab  5    ASPIRIN/CAFFEINE (ANACIN PO) Take  by mouth every day.        Atenolol (TENORMIN) 50 mg Tablet Take 1.5 Tabs by mouth 2 times daily.  270 Tab  3    CENTRUM SILVER TAB take 1 tablet by oral route once daily        Ezetimibe 10 mg/Simvastatin 40 mg (VYTORIN) 10-40 mg Tablet Take 1 Tab by mouth every evening.  90 Tab  3    Fluticasone (FLONASE) 50 mcg/Actuation nasal spray Instill 2 Sprays into EACH nostril every day. As needed.  1 Bottle  12    Indomethacin (INDOCIN SR) 75 mg SR capsule Take 1 Cap by mouth 2 times daily, after meals.  60 Cap  5    Lisinopril-Hydrochlorothiazide (PRINZIDE, ZESTORETIC) 20-25 mg per tablet Take 1 Tab by mouth every morning.  90 Tab   3    Nitroglycerin (NITROSTAT) 0.4 mg Sublingual Tablet Place 1 Tab under the tongue every 5 minutes if needed. 3 doses in 15 minutes, If no relief go to ER  100 Tab  6    OMEGA-3 FATTY ACIDS PO 4800 mg daily        Pantoprazole (PROTONIX) 40 mg Delayed Release Tablet Take 1 Tab by mouth 2 times daily.  180 Tab  3    Rosuvastatin (CRESTOR) 20 mg Tablet Take 1 Tab by mouth every day.  90 Tab  1    Sildenafil (VIAGRA) 50 mg Tablet Take 1 Tab by mouth once daily if needed.  6 Tab  5    VITAMIN B-12 1,000 MCG TAB once daily        VITAMIN C PO 400 mg daily               ROS: Constitutional: negative.  CV: negative.  Resp: negative.  GI: negative.    PHYSICAL EXAM:   Temp: --  Temp src: --  Pulse: --  BP: --  Resp: --  SpO2: --  Height: --  Weight: --    General Appearance: healthy, alert, no distress, pleasant affect, cooperative.  Heart:  normal rate and regular rhythm, no murmurs.  Lungs: clear to auscultation.  Abdomen: BS normal.  Abdomen soft, non-tender.  .  Extremities:  no pedal edema.    Mallampati Classification: Class 3        Labs/imaging:  Performed at [facility], reviewed: see computer database.  Performed outside of [place] (reviewed if applicable):N/A    Impression/ plan:  60yr male with HTN, CAD, GERD, previous colon polyps (last colonoscopy 11/2006 in [place]) and FH colon cancer who presents for colorectal cancer screening.  Plan for colonoscopy today.    Patient is a candidate for moderate sedation.  Patient is ASA status: 2    The procedure, risks, benefits, and alternatives were explained.  All patient questions were answered.  The informed consent was signed, and will be scanned into the computer database at a later date.    Patient barriers to learning:  none    Patient/family understanding:  verbalizes    Electronically signed by:  Dresean Beckel C. Darryl Lent, MD  Attending Physician, Division of Gastroenterology & Hepatology  PI [phone]

## 2010-08-10 NOTE — Patient Instructions (Addendum)
Discharge Instructions for Colonoscopy and/or Upper Endoscopy    Findings:   Normal  * Three polyps removed (3-61mm ascending colon, 2mm transverse colon and 1-75mm sigmoid colon)   Some polyps have the potential of becoming cancerous if they are left to grow.  These polyps are called adenomatous, and are considered pre-cancerous.  Your polyp(s) were removed and sent to pathology in order to determine what type of polyp they are.  * Small internal Hemorrhoids.  When hemorrhoids are detected, a high fiber diet and/or fiber supplementation is recommended.  * Mild sigmoid Diverticulosis.  These are areas of weakening in the walls of the colon commonly seen and are believed to, in part, be secondary to a low fiber diet.  Once detected, the formation of additional diverticuli and complications of existing diverticuli (bleeding, infection) can be prevented by eating a high fiber diet with fiber supplementation.  * Other:  Fair preparation with aggressive washing/suctioning performed; small, flat, or depressed lesions may be missed      Activity:     *  Rest today, resume usual activity tomorrow      Special instructions:       Diet:       Resume usual diet    Soft diet (no uncooked fruits or vegetables) for 1 day, then resume usual diet    Liquid diet for 1 day, then resume usual diet  *  High fiber diet  *  Fiber supplementation (psyllium- metamucil, Citrucel, FiberChoice, FiberOne, etc)       Medication:     *  Resume all usual medications          Hold aspirin, plavix, NSAIDS for   days    Special instructions:       Follow up:       Call primary care physician for follow-up appointment  *   We will notify you of your biopsy results via phone call or letter  *   Repeat colonoscopy in 2 years with 2-day preparation given fair preparation on this exam and multiple polyps seen     GI Clinic  in  weeks    You may experience the following side effects:    1. Discomfort due to gas/bloating (should subside within a few hours to two days)  2. Nausea due to the medications, discomfort, not eating, etc.  3. Dizziness/sleepiness; therefore, do not drive, operate machinery or drink alcohol for at least one day  4. Sore throat after upper endoscopy, treat with cough drops or salt water gargle  5. Small amount of diarrhea after colonoscopy  6. If a colon polyp(s) was removed, a small amount of rectal bleeding is not unusual for one day.  If it continues for more than one day call the number(s) listed below.  If it is a large amount or is associated with lightheadedness or weakness, have someone bring you to the nearest emergency room.    Contact numbers:  Monday-Friday (except holidays) 7:00 am to 5:00 pm [phone]  Evenings/holidays/weekends [phone], and ask for the GI fellow on call    Rya Rausch C. Darryl Lent, M.D

## 2010-08-10 NOTE — Nursing Note (Signed)
>> Joshua Deutscher, RN     Mon Aug 10, 2010 11:17 AM  Tolerated procedure well transferred to recovery room report given to recovery room RN. Joshua Deutscher, RN    >> Joshua Beets, RN     Mon Aug 10, 2010  9:18 AM  Nursing Pre-Procedure Assessment    [name] arrived by ambulating  into clinic today at [phone] from home.      Patient has arranged for a ride home from [name] (wife) who can be contacted at [phone]. Patient agrees to having individual providing ride home present while the post procedure instructions and results are given.  Instructed patient that post sedation they are not to drive or drink alcohol for the remainder of the day.      Verified procedure with the patient scheduled today for: Colonoscopy.    Outpatient prescriptions marked as taking for the 08/10/10 encounter (Office Visit) with Debby Bud CHRIS:  Allopurinol (ZYLOPRIM) 300 mg Tablet, Take 1 Tab by mouth every day., Disp: 90 Tab, Rfl: 3  Alprazolam (XANAX) 0.25 mg Tab Tablet, Take 1 Tab by mouth three times daily if needed., Disp: 90 Tab, Rfl: 5  Atenolol (TENORMIN) 50 mg Tablet, Take 1.5 Tabs by mouth 2 times daily., Disp: 270 Tab, Rfl: 3  Ezetimibe 10 mg/Simvastatin 40 mg (VYTORIN) 10-40 mg Tablet, Take 1 Tab by mouth every evening., Disp: 90 Tab, Rfl: 3  Fluticasone (FLONASE) 50 mcg/Actuation nasal spray, Instill 2 Sprays into EACH nostril every day. As needed., Disp: 1 Bottle, Rfl: 12  Lisinopril-Hydrochlorothiazide (PRINZIDE, ZESTORETIC) 20-25 mg per tablet, Take 1 Tab by mouth every morning., Disp: 90 Tab, Rfl: 3  Pantoprazole (PROTONIX) 40 mg Delayed Release Tablet, Take 1 Tab by mouth 2 times daily., Disp: 180 Tab, Rfl: 3      Anticoagulants: Patient denies taking Aspirin, NSAIDs, or other anticoagulants for the past 5 days.  OTC/Herbal preparations: no    Reviewed with the patient his health status in regards to allergies (including latex), medications, pregnancy, hypertension, atrial fibrilation, liver  disease, bleeding disorders, diabetes,  CVA's, seizures, sleep apnea, glaucoma, cancer, prosthesis or implants, infectious diseases, surgical history; and disease of heart, lungs, liver, or kidneys. Positive history reviewed and updated in Health History section of the EMR.    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]     A-fib [427.31N]     Premature ventricular contractions (PVCs) (VPCs) [427.69AM]     Adjustment disorder with depressed mood [309.0]     -- Keflex (Cephalexin) -- Rash  Past Medical History:    CHRONIC ISCHEMIC HEART DISEASE, UNSPECIFIED     1995            Comment:s/p MI and multiple stents    ESOPHAGEAL REFLUX  04/28/2007     BARRETT'S ESOPHAGUS                             04/28/2007     BENIGN NEOPLASM OF COLON                        04/28/2007     GOUT, UNSPECIFIED                               04/28/2007     ESSENTIAL HYPERTENSION, BENIGN                                OTHER AND UNSPECIFIED HYPERLIPIDEMIA                          CARDIAC ARREST                                  1996            Comment:ischemic related     AMI INFERIOR WALL                               1996          APPENDICITIS, ACUTE                             07/02/2009     ANGINA                                          10/31/2009     SHORTNESS OF BREATH                                           CORONARY ARTERY DISEASE                                       ARTHRITIS                                                      PSYCHIATRIC ILLNESS                                         Past Surgical History:    INSERT INTRACORONARY STENT                                      Comment:Stent placement x 25 by report    EGD  2006          COLONOSCOPY                                     3/08          REMOVAL OF TONSILS,<11 Y/O                                      Comment:Tonsillectomy-childhood    APPENDECTOMY                                    2010          COLONOSCOPY,DIAGNOSTIC                          08/10/2010      Comment:Colonoscopy    Smoking Status: Former Smoker                   Packs/Day: 2     Years: 10         Types: Cigarettes    Smokeless Status: Never Used                        Comment: quit at age 41      Drug Use: No                Pre Procedure Checks:    Identaband on and two forms of identification information verified: yes  Pt completed bowel prep: GI Procedure  NPO since: 2030 (golytely)  Belongings are with patient under gurney.  Patient has: Glasses.  Barriers to Learning assessed: none. Patient verbalizes understanding of teaching and instructions.    Past anesthesia responses:  no prior anesthesia experience.     Aldrete Score (adapted) Pre Sedation:  Moves 4 extremities voluntarily on command = 2  Spontaneous unlabored respirations = 2  BP changed < 20% of preanesthetic level = 2  Awake, alert, normal response to auditory stimuli = 4  Able to maintain O2 saturation > 92% on room air = 2  TOTAL PRE SEDATION ALDRETE SCORE: 12    Nursing Assessment Data Base:  Ventilation   --  Respirations: normal, unlabored.  Circulation/Perfusion -- Skin: warm, normal color and dry.  Cognition/Communication -- Behavior: alert and oriented and cooperative.  Gastro-Intestinal: no distress.  Abdomen: soft and non-tender.  Level of Ambulation: self.    Nursing assessment completed by:   Joshua Beets, RN, RN    Date: 08/10/2010 Time: [phone]

## 2010-08-12 ENCOUNTER — Encounter: Payer: Self-pay | Admitting: Gastroenterology

## 2010-08-12 DIAGNOSIS — Z86010 Personal history of colonic polyps: Secondary | ICD-10-CM

## 2010-08-12 DIAGNOSIS — Z860101 Personal history of adenomatous and serrated colon polyps: Secondary | ICD-10-CM | POA: Insufficient documentation

## 2010-08-12 HISTORY — DX: Personal history of adenomatous and serrated colon polyps: Z86.0101

## 2010-08-12 HISTORY — DX: Personal history of colonic polyps: Z86.010

## 2010-08-24 ENCOUNTER — Encounter: Payer: Self-pay | Admitting: Internal Medicine

## 2010-08-24 ENCOUNTER — Ambulatory Visit: Admitting: Internal Medicine

## 2010-08-24 VITALS — BP 126/84 | HR 85 | Temp 97.6°F | Resp 18 | Wt 237.1 lb

## 2010-08-24 DIAGNOSIS — R251 Tremor, unspecified: Secondary | ICD-10-CM | POA: Insufficient documentation

## 2010-08-24 DIAGNOSIS — F1020 Alcohol dependence, uncomplicated: Secondary | ICD-10-CM | POA: Insufficient documentation

## 2010-08-24 NOTE — Progress Notes (Signed)
[name] is a 59yr old male presents for follow up. Seeing [name] for therapy. Finds it helpful.  Still having difficulty with symptoms of depression: anhedonia, decreased motivation, anxiety, depressed mood. Still drinking, wine mostly, 36 oz per day on average, usually between 1 pm and bedtime. "have been doing this for 40 yrs"  Not motivated to change, quit drinking although both his therapist and I have been emphasizing the importance of doing so for both physical and psychological reasons.  Admits CABG experience contributed to reliving his cardiac arrest 14 yrs ago. Reliving some of the old trauma. Not exercising as much now due to lack of interest and motivation as well.    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    FAMILY HX GI MALIGNANCY V16.0    GOUT UNSPECIFIED 274.9    BENIGN ESSENTIAL HYPERTENSION 401.1    OTHER AND UNSPECIFIED HYPERLIPIDEMIA 272.4    ANXIETY STATE UNSPECIFIED 300.00    Gout 274.9E    Impaired fasting blood sugar 790.21A    INFLAM SPONDYLOPATHY NOS 720.9    Angina 413.9L    CAD (coronary artery disease) 414.00AE    Anxiety 300.00E    S/P CABG x 6 V45.81BW    A-fib 427.31N    Premature ventricular contractions (PVCs) (VPCs) 427.69AM    Adjustment disorder with depressed mood 309.0    Alcohol dependence, continuous 303.57F    Tremor 781.0AC         History   Substance Use Topics    Smoking status: Former Smoker -- 2.0 packs/day for 10 years     Types: Cigarettes    Smokeless tobacco: Never Used    Comment: quit at age 32    Alcohol Use: Yes      chardonnay mostly; 1-2 bottles of wine per day on average         OBJECTIVE:  BP 126/84  Pulse 85  Temp(Src) 36.4 C (97.6 F) (Tympanic)  Resp 18  Wt 107.548 kg (237 lb 1.6 oz)  He appears well, in no apparent distress.  Alert and oriented, pleasant and cooperative.  Mental status exam: he is alert, oriented to  time, person and place. Normal thought content, speech, affect and grooming are noted.       Impression/Plan:      309.0 Adjustment disorder with depressed mood  (primary encounter diagnosis)  Comment: discussed at length  Any medication to help address depression would not be as effective due to alcohol use   Plan: continue therapy for now  Follow up one months     300.00E Anxiety  Comment: chronic, discussed alcohol effect on this as well   Plan: as needed xanax for now     781.0AC Tremor  Comment: essential tremor, alcohol effect contributing to increased symptoms   Plan: see above     303.57F Alcohol dependence, continuous  Comment: counseling provided   Not ready to quit or decrease at this time   Discussed may be having some withdrawal symptoms in am until starts drinking again at 1 pm   Plan: discussed using xanax in am to see if symptoms attenuated, able to delay drinking  Discussed possible change to longer acting benzodiazepine, unfortunately did not tolerate xanax xr due to sedation  Follow up one months     I spent with the patient, 30 minutes of which were spent  counseling and coordinating care as noted above.        Barriers to Learning assessed: none. Patient verbalizes understanding of teaching and instructions.    Electronically Signed By:     Elmarie Shiley M.D.  Attending Physician  [facility]

## 2010-08-24 NOTE — Nursing Note (Signed)
>>   STEFANIE MURPHY     Mon Aug 24, 2010  8:47 AM  Vital signs taken, allergies verified, screened for pain, med hx taken,  screened for chicken pox, and verified immunization status.  [name], MA

## 2010-09-07 ENCOUNTER — Encounter

## 2010-09-08 ENCOUNTER — Ambulatory Visit

## 2010-09-08 ENCOUNTER — Encounter

## 2010-09-11 ENCOUNTER — Telehealth: Payer: Self-pay | Admitting: Internal Medicine

## 2010-09-11 NOTE — Telephone Encounter (Signed)
Patient called stating that they had lab work done on 12-06 and patient would like to get the results of that as soon as possible.    Please call back please advise

## 2010-09-11 NOTE — Telephone Encounter (Signed)
Results and comments sent via MyChart   Shakeera Rightmyer M.D.

## 2010-09-16 ENCOUNTER — Encounter: Admitting: Internal Medicine

## 2010-09-21 ENCOUNTER — Ambulatory Visit: Admitting: Internal Medicine

## 2010-09-21 ENCOUNTER — Telehealth: Payer: Self-pay | Admitting: Internal Medicine

## 2010-09-21 VITALS — BP 118/80 | Temp 98.9°F | Resp 22 | Wt 230.4 lb

## 2010-09-21 MED ORDER — LIDOCAINE 5 % TOPICAL PATCH
1.0000 | MEDICATED_PATCH | TOPICAL | Status: DC
Start: 2010-09-21 — End: 2011-02-08

## 2010-09-21 MED ORDER — LORAZEPAM 1 MG TABLET
0.5000 mg | ORAL_TABLET | Freq: Three times a day (TID) | ORAL | Status: DC
Start: 2010-09-21 — End: 2010-09-30

## 2010-09-21 NOTE — Nursing Note (Signed)
>>   STEFANIE MURPHY     Mon Sep 21, 2010  2:08 PM  Vital signs taken, allergies verified, screened for pain, med hx taken,  screened for chicken pox, and verified immunization status.  [name], MA

## 2010-09-21 NOTE — Telephone Encounter (Signed)
Patient status post fall on his kitchen floor on 09/19/10.  Patient did not go to ER.    He is concerned about possible rib fractures.  Patient is experiencing pain on the LT side of his ribs, difficult taking deep breath, pain with coughing.    Has taken his wife's percoet the last 2 nights to get to sleep due to the pain.    Would like an appointment today with Dr. Felicita Gage - no openings, please advise if patient may be worked in.    Thanks,    Sharene Butters  [facility]  UCDMGEarlene Plater

## 2010-09-21 NOTE — Progress Notes (Signed)
[name] is a 59yr old male presents for new concerns of chest wall pain status post accidental fall. Tripped over a dog gate 2 days ago. Impact on the left lower chest wall. Pleuritic pain. Mostly bothered at night, interfering with sleep. Denies increased cough, shortness of breath, fever or chills, sputum.    Continues to struggle with issues related to both depression and anxiety. Still seeing therapist. Still drinking too much. Using Xanax 3 times a day, long-standing use. Significant increased tremor recently.    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  Alert and oriented, pleasant and cooperative.  Mood is obviously depressed, affect is flat/sad.   Mild to moderate fine resting tremor noted.  Chest is clear to auscultation bilaterally, no wheezing or crackles. There is tenderness to palpation mid left lower chest wall. No bruising.      Impression/Plan:      786.50T Rib pain  (primary encounter diagnosis)  Comment: Chest contusion, exam otherwise unremarkable  Plan: analgesic p.r.n., Lidoderm patch p.r.n.    Z610.9U Fall  Comment: see above   Plan: follow up as needed     311F Depression  Comment: significant issue, discussed at length,  significant alcohol use contributing  Strong recommendation for quitting drinking, appears motivated to try, both wife and close friends have also commented on need to quit drinking  Plan: discussed would consider initiating antidepressant medication management if persistent significant depressive symptoms off alcohol or when at least significantly decreased  Follow-up in about a week as previously scheduled, sooner p.r.n.    781.0AC Tremor  Comment: component of anxiety/alcohol withdrawal contributing, may have some baseline essential tremor as well, doubt significant other neurological disorder  Plan: discussed at length with patient, see below    300.00E Anxiety  Comment: long-standing use and dependence on Xanax, recently tried to switch to longer acting Xanax but did not tolerate, discussed combination of benzodiazepines and alcohol and issues related to this including both dependence and excessive sedation  Patient will try to taper off alcohol use, lorazepam for both anxiety and withdrawal symptoms, stop using Xanax due to short therapeutic half-life, rebound effect   Plan: follow up as above       Barriers to Learning assessed: none. Patient verbalizes understanding of teaching and instructions.    Electronically Signed By:     Woodward Ku M.D.  Attending Physician  [facility]

## 2010-09-21 NOTE — Telephone Encounter (Signed)
Spoke with patient made a appointment for patient to come in to see Dr. Knoepfler today.  Stefanie Murphy  MA I

## 2010-09-30 ENCOUNTER — Ambulatory Visit: Admitting: Internal Medicine

## 2010-09-30 VITALS — BP 142/92 | HR 88 | Temp 97.2°F | Resp 16 | Wt 228.0 lb

## 2010-09-30 MED ORDER — LORAZEPAM 1 MG TABLET
0.5000 mg | ORAL_TABLET | ORAL | Status: DC
Start: 2010-09-30 — End: 2010-11-11

## 2010-09-30 NOTE — Progress Notes (Signed)
[name] is a 59yr old male presents for follow-up anxiety/depression/tremor/alcohol dependence. Accompanied by his wife today. Has done significantly better since last visit. On Xanax, switched over to lorazepam, tolerating it well, tremor significantly improved. Has cut down significantly on alcohol since last visit but still drinking 16-24 ounces of wine per day, now mostly drinking in the late afternoon and evening as he has been doing for 30+ years. Mood significantly improved, and decreased anxiety and depressive symptoms which are confirmed by his wife. Still not back to exercising yet. Still having challenges in social situations, not sure how to handle celebrating the holidays without alcohol. Has appointment with his therapist in about a week.    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 Minimal tremor.   Mental status exam: he is alert, oriented to time, person and place. Normal thought content, speech and grooming are noted.          Impression/Plan:      300.00E Anxiety  (primary encounter diagnosis)  Comment: improving, recommend continued therapy, continue lorazepam for now with the  ultimate goal of tapering dose down  Plan: consider SSRI management as well    309.0 Adjustment disorder with depressed mood  Comment: PTSD features contributing to symptoms, advise patient discussed issues related to his symptoms/alcohol use with his therapist recently, recommend ongoing treatment with his therapist  Plan: see above     303.69F Alcohol dependence, continuous  Comment: discussed long-term goal of stopping drinking  Recent significant worsening symptoms related to increased alcohol use, withdrawal  For now he is willing to try to keep alcohol intake to no more than 16 ounces per day  Plan: follow-up in 2 weeks, sooner p.r.n.    781.0AC Tremor  Comment: improved, related to alcohol/medication effect  Plan: see above     I spent with the patient, 20 minutes of which were spent counseling and coordinating care as noted above.        Barriers to Learning assessed: none. Patient verbalizes understanding of teaching and instructions.    Electronically Signed By:     Woodward Ku M.D.  Attending Physician  [facility]

## 2010-09-30 NOTE — Nursing Note (Signed)
>>   Ann Maki, MA     Wed Sep 30, 2010  8:31 AM  vitals taken, allergies and medications reviewed, pain level recorded.  Diane Joya Gaskins MAII

## 2010-10-14 ENCOUNTER — Ambulatory Visit

## 2010-10-14 ENCOUNTER — Ambulatory Visit: Admitting: Internal Medicine

## 2010-10-14 VITALS — BP 126/81 | HR 82 | Temp 97.6°F | Resp 18 | Ht 71.0 in | Wt 231.8 lb

## 2010-10-14 NOTE — Progress Notes (Signed)
[name] is a 60yr old male presents for follow-up. Continues to see therapist, continues to do well on lorazepam, no significant withdrawal, tremor under good control, mood significantly improved. Has decreased overall alcohol intake to about drinks per day. Usually starts drinking around 4 PM. Has done so for many years, continues to have difficulty accepting the need to quit drinking. Currently reports we'll not be able to completely stop drinking but is willing to continue to go see therapist, try some hypnosis, try to limit alcohol intake to no more than 3 drinks per day. Back to exercising on his treadmill, no anginal symptoms or shortness of breath. Overall feels much better, more socially active and involved.    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  Alert and oriented, pleasant and cooperative.  Mental status exam: he is alert, oriented to time, person and place. Normal thought content, speech, affect and grooming are noted.        Impression/Plan:      303.48F Alcohol dependence, continuous  (primary encounter diagnosis)  Comment: discussed at length, both physical and  psychological effects of chronic alcohol use reviewed in detail  Plan: continue therapy, continue supportive counseling, follow-up 1 months     309.0 Adjustment disorder with depressed mood  Comment: improving  Plan: continue clinical followup    781.0AC Tremor  Comment: stable on benzodiazepine/decreased alcohol  Plan: clinical followup    300.00E Anxiety  Comment: improved/controlled  Plan: follow-up one month, sooner p.r.n.    I spent with the patient, 20 minutes of which were spent counseling and coordinating care as noted above.        Barriers to Learning assessed: none. Patient verbalizes understanding of teaching and instructions.    Electronically Signed By:     Woodward Ku M.D.  Attending Physician  [facility]

## 2010-10-14 NOTE — Nursing Note (Signed)
>>   STEFANIE MURPHY     Wed Oct 14, 2010  8:57 AM  Vital signs taken, allergies verified, screened for pain, med hx taken,  screened for chicken pox, and verified immunization status.  [name], MA

## 2010-11-10 ENCOUNTER — Encounter: Admitting: Rheumatology

## 2010-11-11 ENCOUNTER — Ambulatory Visit: Admitting: Internal Medicine

## 2010-11-11 ENCOUNTER — Ambulatory Visit

## 2010-11-11 VITALS — BP 132/85 | HR 78 | Temp 97.9°F | Resp 18 | Wt 235.7 lb

## 2010-11-11 MED ORDER — ALLOPURINOL 300 MG TABLET
1.0000 | ORAL_TABLET | Freq: Every day | ORAL | Status: DC
Start: 2010-11-11 — End: 2011-06-02

## 2010-11-11 MED ORDER — PANTOPRAZOLE 40 MG TABLET,DELAYED RELEASE
1.0000 | DELAYED_RELEASE_TABLET | Freq: Two times a day (BID) | ORAL | Status: DC
Start: 2010-11-11 — End: 2011-12-08

## 2010-11-11 MED ORDER — ROSUVASTATIN 20 MG TABLET
20.0000 mg | ORAL_TABLET | Freq: Every day | ORAL | Status: DC
Start: 2010-11-11 — End: 2010-12-23

## 2010-11-11 MED ORDER — ATENOLOL 50 MG TABLET
75.0000 mg | ORAL_TABLET | Freq: Two times a day (BID) | ORAL | Status: DC
Start: 2010-11-11 — End: 2011-02-08

## 2010-11-11 MED ORDER — LORAZEPAM 1 MG TABLET
0.5000 mg | ORAL_TABLET | ORAL | Status: DC
Start: 2010-11-11 — End: 2011-02-08

## 2010-11-11 NOTE — Progress Notes (Signed)
[name] is a 60yr old male presents for follow-up depression/anxiety, chronic alcohol dependence. Stop drinking alcohol yesterday. Has continued on lorazepam, doing well with good control of anxiety. Depressive symptoms continued to improve. Currently denies any significant depression. Still seeing therapist, primarily to help with alcohol issues/anxiety. Back to exercising regularly, daily on his treadmill, no associated shortness of breath or chest pain. Has a family reunion coming up in mid March, alcohol and drinking is a big part of his social functions/life. Planning on trying to stay off alcohol for the next 3-4 weeks if possible. Not ready to completely quit drinking for the long-term. Denies any alcohol withdrawal symptoms at this time or increased anxiety since stopping drinking.    Needs medication refills/followup on chronic medical concerns. Has not yet started Crestor as previously discussed, was using some leftover statin medication prior to making that change.  Otherwise reports diet has been overall stable, no significant GI issues. Prior history of Barrett's esophagus, no surveillance endoscopy for several years. Had recent colonoscopy. Denies abdominal pain, melena or hematochezia.    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    A-fib 427.31N    Premature ventricular contractions (PVCs) (VPCs) 427.69AM    Adjustment disorder with depressed mood 309.0    Alcohol dependence, continuous 303.22F    Tremor 781.0AC      History   Substance Use Topics     Smoking status: Former Smoker -- 2.0 packs/day for 10 years     Types: Cigarettes    Smokeless tobacco: Never Used    Comment: quit at age 43    Alcohol Use: Yes      chardonnay mostly; 1-2 bottles of wine per day on average       OBJECTIVE:  BP 132/85  Pulse 78  Temp(Src) 36.6 C (97.9 F) (Tympanic)  Resp 18  Wt 106.913 kg (235 lb 11.2 oz)  He appears well, in no apparent distress.  Alert and oriented, pleasant and cooperative.  Chest is clear  Heart RRR  No edema  Mental status exam: he is alert, oriented to time, person and place. Normal thought content, speech, affect and grooming are noted.       Impression/Plan:      274.9 Gout, unspecified  (primary encounter diagnosis)  Comment: stable clinically, update labs, continue current management  Plan: Allopurinol (ZYLOPRIM) 300 mg Tablet, BASIC         METABOLIC PANEL, URIC ACID, BLOOD            401.1 BENIGN ESSENTIAL HYPERTENSION  Comment: controlled, continue meds  Plan: Atenolol (TENORMIN) 50 mg Tablet, BASIC         METABOLIC PANEL            272.4 Other and unspecified hyperlipidemia  Comment: follow-up labs post changing over to Crestor, follow-up sooner p.r.n  Once again emphasized importance of minimizing additional liver toxicity with alcohol in the context of treatment  for his lipids, increased risk of liver dysfunction/cirrhosis  Plan: Atenolol (TENORMIN) 50 mg Tablet, LIPID PANEL         WITH DLDL REFLEX, HEPATIC FUNCTION PANEL            414.00AE CAD (coronary artery disease)  Comment: clinically stable, continue current management  Plan: Atenolol (TENORMIN) 50 mg Tablet, LIPID PANEL         WITH DLDL REFLEX, HEPATIC FUNCTION PANEL, BASIC        METABOLIC PANEL            530.85 Barrett's esophagus  Comment: clinically stable but due for surveillance upper endoscopy, continue meds  Plan: Pantoprazole (PROTONIX) 40 mg Delayed Release         Tablet, GASTROENTEROLOGY REFERRAL            790.21A Impaired fasting blood sugar  Comment: periodic  monitoring, continue exercise and adherence to diet  Plan: HEMOGLOBIN A1C            300.00E Anxiety  Comment: stable; encouraged to abstain from drinking as much as possible, concern for significant increased use during his family reunion, note provided if it might help him refuse family pressures to drink more  Plan: continue to see therapist; continue medications         Barriers to Learning assessed: none. Patient verbalizes understanding of teaching and instructions.    Electronically Signed By:     Woodward Ku M.D.  Attending Physician  [facility]

## 2010-11-11 NOTE — Nursing Note (Signed)
>>   [name]     Wed Nov 11, 2010  9:19 AM  Vital signs taken, allergies verified, screened for pain, med hx taken,  screened for chicken pox, and verified immunization status.  [name], MA

## 2010-11-13 ENCOUNTER — Ambulatory Visit

## 2010-11-13 ENCOUNTER — Ambulatory Visit: Admitting: Rheumatology

## 2010-11-13 VITALS — BP 116/80 | HR 78 | Temp 96.2°F | Wt 233.0 lb

## 2010-11-13 DIAGNOSIS — M4690 Unspecified inflammatory spondylopathy, site unspecified: Secondary | ICD-10-CM

## 2010-11-13 NOTE — Nursing Note (Signed)
>>   Lavella Lemons, RN     Caleen Essex Nov 13, 2010 10:04 AM  Vital signs taken, allergies verified, pain screened and documented.  Raynelle Fanning Wineinger R.N.

## 2010-11-13 NOTE — Progress Notes (Signed)
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 The patient remains in remission, without notable joint pains, AM stiffness, GI or GU symptoms. His activities of daily living are full.    Examination: Per homunculus; notable for the absence of joint tenderness or swelling.    Laboratories: 12/11, AST 77, ALT 70 (followed by Dr. Felicita Gage).    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 The patient remains in remission. He will return in followup in 6 months or sooner with a flare.

## 2010-11-17 ENCOUNTER — Encounter: Admitting: Cardiovascular Disease

## 2010-11-17 ENCOUNTER — Ambulatory Visit: Admitting: Cardiovascular Disease

## 2010-11-17 VITALS — BP 117/84 | HR 80 | Ht 71.0 in | Wt 234.0 lb

## 2010-11-17 NOTE — Nursing Note (Signed)
>>   Merceda Elks, LVN     Tue Nov 17, 2010 10:36 AM  pt's medication list updated with information provided by pt. Merceda Elks LVN    >> [facility]     Tue Nov 17, 2010 10:35 AM  Vitals signs taken,screened for pain,allergies verified, pharmacy verified, EKG done. Baxter Kail, [place]

## 2010-11-26 NOTE — Progress Notes (Addendum)
November 17, 2010 RE: Joshua, Hensley    MR#: [id number]   DOB: 01-Dec-1950   Date of Service: 11/17/2010       DR.          Dear Dr.  :    History of present illness: [name] is a 60 year old male who returns in followup to [facility] who I am seeing for Dr. Tiburcio Bash Low today. He has a known history of coronary artery disease and hypertension with hyperlipidemia. He states that he has been doing fairly well and has had no chest pain or angina. He has been off of alcohol now for three weeks. He denies any lower extremity edema. He has not been short of breath. He did have a slight increase in his tremors recently and his atenolol was increased to 50 mg, 1-1/2 tablets twice a day. He denies any fevers or chills. He states that he continues to try to exercise regularly and has had no chest pain.     Past medical history: 1. Coronary artery disease with angina beginning in 1995 with multiple PCI and stem placement procedures including up to 25 stents placed that was prior to his coronary artery bypass graft surgery. 2. Six-vessel coronary artery bypass graft surgery by Dr. Sumner Boast, February 2011 - Left internal mammary artery to left anterior descending coronary artery. Free right internal mammary artery to the LAD diagonal. Left radial artery is a free graft placed to the circumflex. Vein grafts were placed to the posterolateral branch, posterior descending and posterior ventricular branch of the right coronary artery. 3. Hypertension. 4. Hyperlipidemia. 5. History of gout. 6. Gastroesophageal reflux disease with history of Barrett's esophagus. 7. Benign familial tremors.     He states that since his last visit, he did complete his course of furosemide and now is on lisinopril HCT. He has not had any lower extremity edema. He does still note some residual numbness of his left fifth finger. However, he does have good motor strength and good hand grip.     Present medications: Allopurinol  300 mg p.o. daily. Atenolol 50 mg 1-1/2 tablets p.o. b.i.d. Lorazepam 1 mg, half tablet p.o. q.d. p.r.n. Protonix 40 mg p.o. daily. Lovastatin 20 mg p.o. q.h.s. which is now going to replace the Zetia 10/40 mg daily, as he is completing the residual pills that he had had. Lisinopril/hydrochlorothiazide 20/25 mg one daily. Centrum Silver 1 daily. Omega 3 fatty acids 4.8 gm daily. Vitamin C daily. Aspirin 1 tablet daily. Vitamin B12 1000 mcg p.o. daily.     Interim review of systems: No fevers or chills. He has not been short of breath. He did not get his followup chest x-ray as he did have a left upper lobe abnormality on prior CT scan that appeared to be residual chest all hematoma. He has had no fevers or chills. No lower extremity edema. He denies any abdominal pain or GI bleeding. He still does note some numbness in his left fifth finger but has no motor deficits. Otherwise, 10-point review of systems is negative.     He is on antiplatelet therapy with aspirin. He is on lipid-lower therapy with statin drugs. He is a former smoker but has quit. He is presently also off of alcohol. He presently has no angina and is functional class I with respect to angina.     Physical examination: Vital signs: Blood pressure 120/78 in the left arm and 117/84 in the right arm. Pulse 80 and regular. Respirations 14,  weight 234 lb. This is similar to his weight of 232 lb in March of 2011. General appearance: Well-developed male in no acute distress. O2 saturation 97% on room air. HEENT: Pupils both react. Extraocular motions are full. No scleral icterus. Neck: JVP approximately 8-9 cm at 30 degrees. Chest: Clear. No rales, wheezes or forced expiratory wheezes. No chest wall tenderness. The sternal incision is intact. Abdomen: Moderately obese but soft. Bowel sounds are present. Extremities: No edema, no calf tenderness, no peripheral cyanosis or clubbing. Pulses - Carotids are 2+ and equal bilaterally without bruits. Brachials are  2+. Posterior tibials are 2+. There is a well-healed left arm scar from his prior radial artery removal. This incision is well healed. His hand grips are equal. He does note some numbness subjectively in his left fifth finger. Neurologic: He is oriented x 3 and easily ambulant.     EKG demonstrates sinus rhythm with possible left atrial enlargement. No acute changes. He did have an interim Holter monitor on 05/06/10 that showed 254 PACs and 922 PVCs with rare couplets. No ventricular tachycardia and no atrial fibrillation. He is now off of the amiodarone but does remain on beta blocker therapy. He denies any palpitations. No lightheadedness or syncope.     Laboratory from 09/08/10 shows a cholesterol of 190, LDL of 106, HDL of 62, triglycerides 108, hemoglobin A1c 5.6, AST 77, albumin 4.2.     Impression: 1. CAD. Prior history of multiple stent placements beginning in 1995. However, he did undergo successful coronary artery bypass graft surgery in February 2011. He has not had any recurrent chest pain. He does note a residual slight numbness of his left fifth finger since his surgery but no motor deficits. 2. Postoperative chest wall hematoma likely resolving. He did not get the interim chest x-ray and this will need to be rescheduled. 3. Hypertension with hypertensive heart disease presently controlled. He does continue on a regimen that includes lisinopril, hydrochlorothiazide and atenolol. 4. Hyperlipidemia - Dr. Felicita Gage has given him the prescription for the lovastatin but he has not started it yet. He only has a few more tablets of the Vytorin but will discontinue the Vytorin now and start on the lovastatin as he is to get some followup laboratory work to check his lipid panel in March and this will also be a time when he can re-check his liver function tests. He is now off of alcohol. 5. History of gout on allopurinol. 6. History of congestive heart failure secondary to diastolic dysfunction. He is on  hydrochlorothiazide. No lower extremity edema. 7. Postoperative paroxysmal atrial fibrillation. His followup Holter did not show any evidence of atrial fibrillation and he has not had any symptoms from this. He did have occasional PVCs. He is now off of the amiodarone but will remain on beta blocker therapy. The beta blocker therapy has also helped his tremors. 8. History of tremors presently on beta blocker therapy. See above. 9. Mild residual ulnar neuropathy associated with some mild numbness of his left fifth finger. No motor deficits. 10. Allergy to Keflex which caused a rash. 11. Gastroesophageal reflux disease with history of Barrett's esophagus. He has had GI followup for this and this followup with Dr. Felicita Gage. He also does get regular colonoscopy.     Plan: 1. He will continue his present medical regimen. He has been stable with this without angina. He will switch from the Vytorin to the Crestor. He is scheduled for laboratory work with Dr. Felicita Gage to include a  lipid panel and chem-metabolic panel and followup of his liver function tests. On Vytorin, his LDL cholesterol remained elevated at 106 and hopefully with Crestor he will reach goal. 2. Followup chest x-ray. 3. Follow up with Dr. Rachell Cipro in four to six months unless further problems arise in the interim. I have gone over his medications with him. He is to call if he should have interim problems.                 Odette Horns, MD  ASSOCIATE CLINICAL PROFESSOR  DIVISION OF CARDIOVASCULAR MEDICINE  DEPARTMENT OF INTERNAL MEDICINE  THIS WAS ELECTRONICALLY SIGNED - 11/26/2010 9:39 PM PST BY:                   ZOX:[id number])    D: 11/17/2010 03:45 PM  T: 11/17/2010 06:56 PM  C#: [id number]    cc:   Woodward Ku, MD

## 2010-12-03 ENCOUNTER — Ambulatory Visit

## 2010-12-03 ENCOUNTER — Encounter

## 2010-12-11 ENCOUNTER — Encounter: Payer: Self-pay | Admitting: Internal Medicine

## 2010-12-11 NOTE — Telephone Encounter (Signed)
From: [name]  To: Woodward Ku, MD  Sent: 12/11/2010 7:18 AM PST  Subject: Non-urgent Medical Advice Question    Hi Dr. Kirtland Bouchard,  I switched from Vytorin to Crestor on 2/14.Marland KitchenMarland KitchenMarland KitchenI now feel like a 60 year old man...every muscle/bone I have seems to ache....especially getting out of bed in the am. I had similar symptons with Lipitor years ago...stopped it and all symptons vanished. I know I am 59 but I seriously do not think I should ache like this. Thoughts?  [name]

## 2010-12-17 ENCOUNTER — Telehealth: Payer: Self-pay | Admitting: Internal Medicine

## 2010-12-17 MED ORDER — LISINOPRIL 20 MG-HYDROCHLOROTHIAZIDE 25 MG TABLET
1.0000 | ORAL_TABLET | Freq: Every day | ORAL | Status: DC
Start: 2010-12-17 — End: 2010-12-28

## 2010-12-17 NOTE — Telephone Encounter (Signed)
Prescription sent.  Anca Knoepfler M.D.

## 2010-12-17 NOTE — Telephone Encounter (Signed)
Patient calling back with the fax number of pharmacy. [phone].    Thank you  [name]  [facility]

## 2010-12-17 NOTE — Telephone Encounter (Signed)
Patient was advised of Dr. Felicita Gage message. Patient prescription was sent.  [name]  MA I

## 2010-12-17 NOTE — Telephone Encounter (Signed)
Requested Prescriptions     Pending Prescriptions Disp Refills    Lisinopril-Hydrochlorothiazide (PRINZIDE, ZESTORETIC) 20-25 mg per tablet 90 tablet 3     Sig: Take 1 tablet by mouth every morning.     [name] is in [place] and has forgoten the above medication.    He needs to get a prescription for only 4 pills. He will be back in [place] on Sunday and has plenty of this medication at home.    [name] will check with the pharmacy.    Please call for any questions.

## 2010-12-23 ENCOUNTER — Ambulatory Visit: Admitting: Internal Medicine

## 2010-12-23 VITALS — BP 150/94 | HR 82 | Temp 96.9°F | Wt 242.0 lb

## 2010-12-23 MED ORDER — EZETIMIBE 10 MG-SIMVASTATIN 40 MG TABLET
1.0000 | ORAL_TABLET | Freq: Every day | ORAL | Status: DC
Start: 2010-12-23 — End: 2011-08-13

## 2010-12-23 NOTE — Progress Notes (Signed)
[name] is a 60yr old male presents for follow-up. Since last visit had family reunion, admits to drinking excessively during the 3 days that he was there. Currently back to his Baseline drinking, about 1-1-1/2 bottles of wine typically after 4 PM. Tremor anxiety symptoms well controlled. Denies any new GI symptoms, abdominal pain, nausea or vomiting. Unfortunately unable to tolerate Crestor, significant myalgias with medication, subsequently stopped Crestor, myalgias resolved, tried medication again and symptoms recurred quickly.  Denies chest pain, increased shortness of breath, has not been able to exercise as much due to muscle pain but now that he is off medication plans on resuming.  Reports mood is been stable.  We'll be seeing therapist for a follow-up visit tomorrow and will likely take a break for a while after that.    Patient Active Problem List   Diagnoses    ESOPHAGEAL REFLUX    BARRETT'S ESOPHAGUS    Benign neoplasm of colon    FAMILY HX GI MALIGNANCY    GOUT UNSPECIFIED    BENIGN ESSENTIAL HYPERTENSION    OTHER AND UNSPECIFIED HYPERLIPIDEMIA    Gout    Impaired fasting blood sugar    INFLAM SPONDYLOPATHY NOS    Angina    CAD (coronary artery disease)    Anxiety    S/P CABG x 6    A-fib    Premature ventricular contractions (PVCs) (VPCs)    Adjustment disorder with depressed mood    Alcohol dependence, continuous    Tremor      History   Substance Use Topics    Smoking status: Former Smoker -- 2.0 packs/day for 10 years     Types: Cigarettes    Smokeless tobacco: Never Used    Comment: quit at age 60    Alcohol Use: Yes      chardonnay mostly; 1-2 bottles of wine per day on average       OBJECTIVE:  BP 150/94  Pulse 82  Temp(Src) 36.1 C (96.9 F) (Tympanic)  Wt 109.77 kg (242 lb)  He appears well, in no apparent distress.  Alert and oriented, pleasant and cooperative.  Chest is clear  Heart RRR  Extremities no significant pitting pretibial edema  Mental status exam: he  is alert, oriented to time, person and place. Normal thought content, speech, affect and grooming are noted.         Impression/Plan:      414.00 CAD (coronary artery disease)  (primary encounter diagnosis)  Comment: clinically stable, although improved lipid panel on Crestor, unable to tolerate, similar side effects on Lipitor previously, resuming to our, follow-up labs, consider adding WelChol to achieve LDL goal  Plan: LIPID PANEL WITH DLDL REFLEX, COMPREHENSIVE         METABOLIC PANEL            272.4 Other and unspecified hyperlipidemia  Comment: see above   Plan: LIPID PANEL WITH DLDL REFLEX, COMPREHENSIVE         METABOLIC PANEL            401.1 BENIGN ESSENTIAL HYPERTENSION  Comment: elevated blood pressure today, discussed, may be an effect of recent travel/lifestyle changes  Plan: continue current management, if remains elevated will need further medication changes    303.91 Alcohol dependence, continuous  Comment: patient is not willing to quit alcohol currently, understands health risks associated with chronic alcohol use including increased risk of liver toxicity, potential liver cirrhosis/liver failure  Plan: we'll continue to provide supportive counseling, close monitoring  of liver enzymes      Barriers to Learning assessed: none. Patient verbalizes understanding of teaching and instructions.    Electronically Signed By:     Woodward Ku M.D.  Attending Physician  [facility]

## 2010-12-23 NOTE — Patient Instructions (Signed)
PLEASE STOP AT THE FRONT DESK TO SCHEDULE A LAB APPOINTMENT for new lab tests that have been ordered. PLEASE DO THESE TESTS IN 6 WEEKS, after a 12 hour fast (nothing to eat or drink except water). The lab tests have been ordered through the computer, no paper lab slip is needed. The lab is open from 8:00 AM to 4:15 Monday to Friday.  The lab is located on the first floor of the clinic building in the waiting area for [address]. The lab results will be sent to you by mail in approximately 2 weeks.

## 2010-12-23 NOTE — Nursing Note (Signed)
>>   [name]     Wed Dec 23, 2010  8:35 AM  Vital signs taken, allergies verified, screened for pain, med hx taken and chief complaint noted. Elease Hashimoto M.A.II

## 2010-12-28 ENCOUNTER — Encounter: Payer: Self-pay | Admitting: Internal Medicine

## 2010-12-28 MED ORDER — LISINOPRIL 20 MG-HYDROCHLOROTHIAZIDE 25 MG TABLET
1.0000 | ORAL_TABLET | Freq: Every day | ORAL | Status: DC
Start: 2010-12-28 — End: 2011-12-08

## 2010-12-28 NOTE — Telephone Encounter (Signed)
From: [name]  To: Aurelio Jew, MD  Sent: 12/28/2010 11:01 AM PDT  Subject: Non-urgent Medical Advice Question    Good morning Dr. Felicita Gage,  I need 3 Rx's renewed at [facility] delivery please.Marland KitchenMarland KitchenAllopurinol 300 mg; Atenolol 50 mg, and Lisinop/HCTZ Tab 20-25 mg. I just ordered Lisin and Allopurinol but Atenolol requires a new Rx. The other two now also need new Rx's...many thanks.  [name] [employer]

## 2010-12-28 NOTE — Telephone Encounter (Signed)
From: [name]  To: Aurelio Jew, MD  Sent: 12/28/2010 1:57 PM PDT  Subject: Non-urgent Medical Advice Question    Judeth Cornfield,  Guess what came in todays mail?? A refill of Allopurinol!! LOL!! Sorry....  [name]

## 2010-12-29 ENCOUNTER — Encounter: Payer: Self-pay | Admitting: Internal Medicine

## 2010-12-29 NOTE — Telephone Encounter (Signed)
From: [name]  To: Aurelio Jew, MD  Sent: 12/28/2010 8:18 PM PDT  Subject: Non-urgent Medical Advice Question    Instructions on [name]'s cough syrup are 5-10 ml by mouth at bedtime....what is 5-10 ml??? How about teaspoons/tablespoons?? Please answer asap....this is bizarre.....  [name] Lorman  bedtime

## 2011-01-04 ENCOUNTER — Encounter: Payer: Self-pay | Admitting: Internal Medicine

## 2011-01-04 NOTE — Telephone Encounter (Signed)
From: [name]  To: Aurelio Jew, MD  Sent: 12/31/2010 7:50 PM PDT  Subject: Non-urgent Medical Advice Question    [name]! [name] and me just received notice from [place]....are you leaving the group? Is there someone you can recommend to [place]? Needless to say we are very upset....Marland Kitchennot with you and not with your decision....this we understand. We just don't want to lose you as our primary care doctor.  Sincerely,  [facility] [employer]

## 2011-02-03 ENCOUNTER — Ambulatory Visit

## 2011-02-08 ENCOUNTER — Ambulatory Visit: Admitting: Internal Medicine

## 2011-02-08 ENCOUNTER — Encounter: Payer: Self-pay | Admitting: Internal Medicine

## 2011-02-08 VITALS — BP 142/89 | HR 79 | Temp 97.4°F | Resp 16 | Wt 237.7 lb

## 2011-02-08 MED ORDER — OLOPATADINE 0.2 % EYE DROPS
1.0000 [drp] | Freq: Every day | OPHTHALMIC | Status: DC
Start: 2011-02-08 — End: 2012-03-23

## 2011-02-08 MED ORDER — LORAZEPAM 1 MG TABLET
ORAL_TABLET | ORAL | Status: DC
Start: 2011-02-08 — End: 2011-03-10

## 2011-02-08 MED ORDER — ATENOLOL 100 MG TABLET
100.0000 mg | ORAL_TABLET | Freq: Two times a day (BID) | ORAL | Status: DC
Start: 2011-02-08 — End: 2011-12-09

## 2011-02-08 MED ORDER — AZELASTINE 137 MCG (0.1 %) NASAL SPRAY
2.0000 | Freq: Two times a day (BID) | NASAL | Status: AC
Start: 2011-02-08 — End: 2012-02-08

## 2011-02-08 MED ORDER — ATENOLOL 100 MG TABLET
100.0000 mg | ORAL_TABLET | Freq: Two times a day (BID) | ORAL | Status: DC
Start: 2011-02-08 — End: 2011-02-08

## 2011-02-08 MED ORDER — COLESEVELAM 625 MG TABLET
1875.0000 mg | ORAL_TABLET | Freq: Two times a day (BID) | ORAL | Status: DC
Start: 2011-02-08 — End: 2011-06-02

## 2011-02-08 NOTE — Progress Notes (Signed)
[name] is a 60yr old male presents for follow-up of multiple issues.    New complaint of significant increased allergy symptoms. Has been using over-the-counter cold and sinus medicine, helpful somewhat. Significant eye tearing, clear, no pain or visual changes. No UPPER RESPIRATORY INFECTION symptoms, purulent drainage. Continues on Flonase but only partially helpful. Has not found over-the-counter oral antihistamines particularly helpful. Typically has worsening symptoms around spring, much worse with change in weather/wind. No shortness of breath or cough, no wheezing. Continues to exercise on his treadmill regularly. No anginal symptoms. No change in exercise tolerance.     Otherwise at baseline. Continues to drink, unchanged. Anxiety stable. Continues on medications unchanged. Mood stable. No worsening tremor.    Patient Active Problem List   Diagnoses    ESOPHAGEAL REFLUX    BARRETT'S ESOPHAGUS    Benign neoplasm of colon    FAMILY HX GI MALIGNANCY    GOUT UNSPECIFIED    BENIGN ESSENTIAL HYPERTENSION    OTHER AND UNSPECIFIED HYPERLIPIDEMIA    Gout    Impaired fasting blood sugar    INFLAM SPONDYLOPATHY NOS    Angina    CAD (coronary artery disease)    Anxiety    S/P CABG x 6    A-fib    Premature ventricular contractions (PVCs) (VPCs)    Adjustment disorder with depressed mood    Alcohol dependence, continuous    Tremor      History   Substance Use Topics    Smoking status: Former Smoker -- 2.0 packs/day for 10 years     Types: Cigarettes    Smokeless tobacco: Never Used    Comment: quit at age 1    Alcohol Use: Yes      chardonnay mostly; 1-2 bottles of wine per day on average       Review of Systems -  as noted above for pertinents.     OBJECTIVE:  BP 142/89  Pulse 79  Temp(Src) 36.3 C (97.4 F) (Oral)  Resp 16  Wt 107.82 kg (237 lb 11.2 oz)  He appears well, in no apparent distress.  Alert and oriented, pleasant and cooperative.  Mental status exam: he is alert, oriented  to time, person and place. Normal thought content, speech, affect, mood and grooming are noted.   Eye exam is normal; sclera nonicteric, conjunctiva mildly injected, clear drainage only.    Oropharynx is clear without lesions or exudate.   Neck is supple and free of adenopathy or masse  Chest is clear to auscultation bilaterally. No wheezing, crackles or rhonchi. Normal symmetric air entry throughout both lung fields. No chest wall tenderness.  Heart exam shows regular rate and rhythm. No murmurs, rubs or gallops. No edema      Impression/Plan:      477.9 Allergic rhinitis, cause unspecified  (primary encounter diagnosis)  Comment: Continue Flonase, add oral antihistamine only, no decongestant, discussed increased risk of hypertension and vasospasm in the setting of CAD/hypertension  Consider a trial of Singulair, to notify office if not improving over the next couple weeks  Plan: Azelastine Nasal (ASTELIN) 137 mcg Spray            401.1 BENIGN ESSENTIAL HYPERTENSION  Comment: no evidence of bradycardia, tolerating increased dose of atenolol, we'll increase further if 100 mg b.i.d., continue other medications unchanged  Plan: Atenolol (TENORMIN) 100 mg Tablet,         DISCONTINUED: Atenolol (TENORMIN) 100 mg Tablet  272.4 Other and unspecified hyperlipidemia  Comment: unfortunately developed significant muscle pain with more potent statins, not at goal with Vytorin alone, trial of WelChol  May also help with glucose intolerance  Alcohol effect on above also discussed  Discussed importance of continuing exercise, weight loss  Plan: Atenolol (TENORMIN) 100 mg Tablet, LIPID PANEL         WITH DLDL REFLEX, HEPATIC FUNCTION PANEL,         CREATINE KINASE, DISCONTINUED: Atenolol         (TENORMIN) 100 mg Tablet            414.00 CAD (coronary artery disease)  Comment: clinically stable   Plan: Atenolol (TENORMIN) 100 mg Tablet, LIPID PANEL         WITH DLDL REFLEX, HEPATIC FUNCTION PANEL,         CREATINE  KINASE, DISCONTINUED: Atenolol         (TENORMIN) 100 mg Tablet            300.00 Anxiety  Comment: stable overall; continued to counsel on decreasing alcohol use/increased risk of combination of alcohol and benzos  Plan: follow up as needed       372.14 Allergic conjunctivitis  Comment: trial of topical medications per orders   Stop over the counter visine   Plan: follow up as needed       Barriers to Learning assessed: none. Patient verbalizes understanding of teaching and instructions.    Electronically Signed By:     Woodward Ku M.D.  Attending Physician  [facility]

## 2011-02-08 NOTE — Nursing Note (Signed)
>>   Raelene Bott, [place]     Mon Feb 08, 2011  8:07 AM  Vital signs taken, allergies verified, screened for pain, med hx taken(if appropriate). Raelene Bott, MA I

## 2011-02-08 NOTE — Patient Instructions (Addendum)
Avoid oral decongestants  Allegra or claritin in am and/or zyrtec/benadryl at bedtime   Continue flonase  Add astelin  Afrin ok for 3 days only   pataday eye drops    PLEASE STOP AT THE FRONT DESK TO SCHEDULE A LAB APPOINTMENT for new lab tests that have been ordered. PLEASE DO THESE TESTS IN 4-6 WEEKS, after a 12 hour fast (nothing to eat or drink except water). The lab tests have been ordered through the computer, no paper lab slip is needed. The lab is open from 8:00 AM to 4:15 Monday to Friday.

## 2011-03-09 ENCOUNTER — Other Ambulatory Visit: Payer: Self-pay | Admitting: Internal Medicine

## 2011-03-09 NOTE — Telephone Encounter (Signed)
It look like prescription was sent on 02/08/11 with 5 additional refill. I was e-faxed the prescription. I will call pharmacy and patient to make sure it has been resolved.   Elease Hashimoto M.A.II

## 2011-03-09 NOTE — Telephone Encounter (Signed)
Spoke to [facility] pharmacy. They found the prescription form 5/7. Patient filled his prescription today, a few days early pharmacy stated. He has been having trouble cutting the 1 mg into 0.5 mg. Patient would like a new prescription sent for 0.5 mg instead. Please advise. Will hold for Dr.k tomorrow.     Elease Hashimoto M.A.II

## 2011-03-09 NOTE — Telephone Encounter (Signed)
Patient called his pharmacy to refill Lorazepam prescription.  Pharmacy advised patient to call his PCP and request a renewal.  Patient has a prescription already dating 02/08/11 with 5 refills. Patient is completely out of medication.  Please call pharmacy to advise and the patient to let him know the problem has been resolved.  Patient advised message will go to the covering MD.    Thank You,  Elige Radon [facility]

## 2011-03-10 MED ORDER — LORAZEPAM 0.5 MG TABLET
ORAL_TABLET | ORAL | Status: DC
Start: 2011-03-10 — End: 2011-06-02

## 2011-03-10 NOTE — Telephone Encounter (Signed)
Medication changed.  Anca Knoepfler M.D.

## 2011-03-10 NOTE — Telephone Encounter (Signed)
Stef  Can this encounter be closed?    Thank You,  Elige Radon [facility]

## 2011-03-16 ENCOUNTER — Ambulatory Visit

## 2011-03-19 ENCOUNTER — Encounter: Payer: Self-pay | Admitting: Internal Medicine

## 2011-03-19 NOTE — Telephone Encounter (Signed)
From: [name]  To: Aurelio Jew, MD  Sent: 03/19/2011 11:23 AM PDT  Subject: Non-urgent Medical Advice Question    Hi Dr. Kirtland Bouchard,  I was wondering where my labs are.Marland KitchenMarland KitchenI had blood drawn on Monday??  Thank you.  [name]

## 2011-03-22 ENCOUNTER — Ambulatory Visit

## 2011-03-22 ENCOUNTER — Ambulatory Visit: Admitting: Internal Medicine

## 2011-03-22 VITALS — BP 121/79 | HR 80 | Temp 97.9°F | Resp 18 | Ht 71.0 in | Wt 237.4 lb

## 2011-03-22 NOTE — Progress Notes (Signed)
[name] is a 60yr old male Presents for follow-up labs. Recent lipids notable for markedly increased triglycerides, otherwise unchanged. Recall unable to tolerate more potent statins, has no history of heart disease, status post CABG, LDL goal of 70 or less. Currently on Vytorin, added WelChol, tolerating it okay although reports a bit of a challenge in terms of complying with this medication, trying to take it twice a day and not interfere with his other medications. Does report to less than optimal diet shortly before lab work as well as ongoing significant alcohol use. Recent labs also notable for increased liver enzymes although this is not new. Denies any GI symptoms. No chest pain or shortness of breath.    New concerns: Left inner thigh discomfort, no groin pain, exercises regularly on his treadmill, no actual injury noted. No swelling or bruising. Also with increased discomfort right Achilles, no trauma or injury, reports uses good supportive shoes for exercise. No previous history of significant Achilles tendinitis or injury.    Patient Active Problem List   Diagnoses    ESOPHAGEAL REFLUX    BARRETT'S ESOPHAGUS    Benign neoplasm of colon    FAMILY HX GI MALIGNANCY    GOUT UNSPECIFIED    BENIGN ESSENTIAL HYPERTENSION    OTHER AND UNSPECIFIED HYPERLIPIDEMIA    Gout    Impaired fasting blood sugar    INFLAM SPONDYLOPATHY NOS    Angina    CAD (coronary artery disease)    Anxiety    S/P CABG x 6    A-fib    Premature ventricular contractions (PVCs) (VPCs)    Adjustment disorder with depressed mood    Alcohol dependence, continuous    Tremor     OBJECTIVE:  BP 121/79  Pulse 80  Temp(Src) 36.6 C (97.9 F) (Tympanic)  Resp 18  Ht 1.803 m (5\' 11" )  Wt 107.684 kg (237 lb 6.4 oz)  BMI 33.11 kg/m2  He appears well, in no apparent distress.  Alert and oriented, pleasant and cooperative.  Chest is clear  Heart RRR  Lower extremities with no significant edema, no abnormal palpable changes  right Achilles, normal range of motion, normal calf squeeze  Left inner thigh with no visible skin changes, bruising  Normal range of motion at the left hip      Impression/Plan:      272.4 Other and unspecified hyperlipidemia  (primary encounter diagnosis)  Comment: Discussed at length with patient, importance of decreasing and preferably quitting alcohol emphasized again  He will repeat labs and if persistently elevated triglycerides consider stopping WelChol to see if this is a medication side effect, consider possible trial of gemfibrozil in combination with statins although increased risk of myalgia/rhabdomyolysis discussed  Plan: LIPID PANEL WITH DLDL REFLEX, LIPID PANEL WITH         DLDL REFLEX, HEPATIC FUNCTION PANEL            414.00 CAD (coronary artery disease)  Comment: Clinically stable  Plan: LIPID PANEL WITH DLDL REFLEX, HEPATIC FUNCTION         PANEL        Continue current management    790.4 Abnormal liver enzymes  Comment: Likely related to alcohol use, fatty liver, no documentation of a previous hep C screen, we'll update  Plan: HEPATITIS C AB SCREEN            726.71 Achilles tendonitis  Comment: Handout provided on exercises for Achilles tendinitis, modify aerobic workout included nonweightbearing exercise at least  2-3 days per week  Plan: follow up as needed     844.9 Muscle strain, lower leg  Comment: Stretching, modified exercise, clinical followup  Plan: follow up as needed       Barriers to Learning assessed: none. Patient verbalizes understanding of teaching and instructions.    Electronically Signed By:     Woodward Ku M.D.  Attending Physician  [facility]

## 2011-03-22 NOTE — Nursing Note (Signed)
>>   [facility] MURPHY     Mon Mar 22, 2011  8:26 AM  Vital signs taken, allergies verified, screened for pain, med hx taken,  screened for chicken pox, and verified immunization status.  [name], MA

## 2011-04-05 ENCOUNTER — Ambulatory Visit

## 2011-04-05 LAB — HEPATIC FUNCTION PANEL
Alanine Transferase (ALT): 93 U/L — ABNORMAL HIGH (ref 6–63)
Albumin: 4 g/dL (ref 3.2–4.6)
Alkaline Phosphatase (ALP): 57 U/L (ref 35–115)
Aspartate Transaminase (AST): 85 U/L — ABNORMAL HIGH (ref 15–43)
Bilirubin Direct: <0.1 mg/dL (ref 0.0–0.2)
Bilirubin Total: 0.6 mg/dL (ref 0.3–1.3)
Protein: 7 g/dL (ref 6.3–8.3)

## 2011-04-05 LAB — HEPATITIS C AB SCREEN

## 2011-04-06 ENCOUNTER — Encounter: Payer: Self-pay | Admitting: Internal Medicine

## 2011-04-06 NOTE — Telephone Encounter (Signed)
From: [name]  To: Aurelio Jew, MD  Sent: 04/05/2011 6:27 PM PDT  Subject: Non-urgent Medical Advice Question    I stopped taking "WELCHOL" today....will have new tests done in two weeks....nothing has changed but that drug....these results are out of sight!  [name]

## 2011-04-08 ENCOUNTER — Encounter: Payer: Self-pay | Admitting: Internal Medicine

## 2011-04-08 NOTE — Telephone Encounter (Signed)
From: [name]  To: Aurelio Jew, MD  Sent: 04/08/2011 1:09 PM PDT  Subject: Non-urgent Medical Advice Question    Hi,  I just looked up Welchol and wikipedia states:  Investigations Common: serum triglyceride increased;   I hope that's the problem...we shall see.  [name]

## 2011-04-21 ENCOUNTER — Ambulatory Visit

## 2011-05-08 ENCOUNTER — Other Ambulatory Visit: Payer: Self-pay | Admitting: Internal Medicine

## 2011-05-14 ENCOUNTER — Ambulatory Visit

## 2011-05-18 ENCOUNTER — Encounter: Admitting: Cardiovascular Disease

## 2011-05-26 ENCOUNTER — Ambulatory Visit: Admitting: Internal Medicine

## 2011-06-02 ENCOUNTER — Encounter: Payer: Self-pay | Admitting: Internal Medicine

## 2011-06-02 ENCOUNTER — Ambulatory Visit: Admitting: Internal Medicine

## 2011-06-02 ENCOUNTER — Ambulatory Visit

## 2011-06-02 VITALS — BP 135/91 | HR 79 | Temp 97.2°F | Resp 18 | Ht 71.0 in | Wt 245.8 lb

## 2011-06-02 MED ORDER — ALLOPURINOL 300 MG TABLET
1.0000 | ORAL_TABLET | Freq: Every day | ORAL | Status: DC
Start: 2011-06-02 — End: 2011-12-08

## 2011-06-02 NOTE — Patient Instructions (Signed)
[facility]: [address], [address], [place], [place] [address]  Phone: [phone]

## 2011-06-02 NOTE — Nursing Note (Signed)
>>   [name]     Wed Jun 02, 2011  8:09 AM  Vital signs taken, allergies verified, screened for pain, med hx taken,  screened for chicken pox, and verified immunization status.  [name], MA

## 2011-06-02 NOTE — Progress Notes (Signed)
[name] is a 60yr old male Presents for follow-up chronic medical concerns/meds. Ongoing difficulty with left inner thigh pain, previous strain, improving with stretching and modified activity but may have regarding\re injured it recently. Also with increased back pain secondary to altered, not being able to exercise regularly on his treadmill. 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  Denies any radicular-like symptoms. No neurological symptoms. No other significant injury or trauma.    Also with new concerns of nocturnal wheezing. Patient not aware of the symptoms, reported by his wife. Denies shortness of breath, orthopnea PND. No chest pain. No daytime symptoms and no exertional associated symptoms. No significant cough. Has history of acid reflux/Barrett's esophagus. Denies any heartburn or indigestion, continues on proton pump inhibitors.    History of coronary disease, status post CABG. Denies anginal symptoms. No CHF symptoms. Significant worsened lipids with the addition of WelChol, triglycerides minimally elevated. Improved off of WelChol, still suboptimal overall.   Continues on Vytorin. Has not been able to tolerate the statins.  Continues to drink alcohol.    Anxiety symptoms controlled. Denies increased depressive symptoms.    Review of Systems -  as noted above for pertinents. All others negative.     Patient Active Problem List   Diagnoses    ESOPHAGEAL REFLUX    BARRETT'S ESOPHAGUS    Benign neoplasm of colon    FAMILY HX GI MALIGNANCY    GOUT UNSPECIFIED    BENIGN ESSENTIAL HYPERTENSION    OTHER AND UNSPECIFIED HYPERLIPIDEMIA    Gout    Impaired fasting blood sugar    INFLAM SPONDYLOPATHY NOS    Angina    CAD (coronary artery disease)    Anxiety    S/P CABG x 6    A-fib    Premature ventricular contractions (PVCs) (VPCs)    Adjustment disorder with depressed mood    Alcohol dependence, continuous     Tremor      History   Substance Use Topics    Smoking status: Former Smoker -- 2.0 packs/day for 10 years     Types: Cigarettes    Smokeless tobacco: Never Used    Comment: quit at age 25    Alcohol Use: Yes      chardonnay mostly; 1-2 bottles of wine per day on average       OBJECTIVE:  BP 135/91  Pulse 79  Temp(Src) 36.2 C (97.2 F) (Tympanic)  Resp 18  Ht 1.803 m (5\' 11" )  Wt 111.494 kg (245 lb 12.8 oz)  BMI 34.28 kg/m2  He appears well, in no apparent distress.  Alert and oriented, pleasant and cooperative.  Chest is clear; No wheezing, crackles. No wheezing with forced expiratory maneuver.  Heart RRR  No edema   Abdomen is obese.   Left inner thigh, upper area tenderness. Hip range of motion is normal.      Impression/Plan:      848.9 Muscle strain  (primary encounter diagnosis)  Comment: Discussed alternative aerobic exercise such as stationary bike versus elliptical vs swimming  Plan: PHYSICAL THERAPY REFERRAL            274.9 Gout, unspecified  Comment: stable; continue medications   Plan: Allopurinol (ZYLOPRIM) 300 mg Tablet, CBC AUTO         + REFLEX MANUAL DIFF, URIC ACID, BLOOD            724.5 Back pain  Comment: significant mechanical factors, weight loss would be helpful as well  Trial of Physical Therapy for lumbar stabilization  Plan: PHYSICAL THERAPY REFERRAL        Follow up as needed     530.85 Barrett's esophagus  Comment: aware overdue for follow up with GI for EGD  Continue PPI  Plan: CBC AUTO + REFLEX MANUAL DIFF            272.4 Other and unspecified hyperlipidemia  Comment: continue medications, periodic labs   Plan: THYROID STIMULATING HORMONE, THYROXINE, FREE         (FREE T4)            414.00 CAD (coronary artery disease)  Comment: stable   Plan: follow up with Cardiology as planned, continue management     790.29 Hyperglycemia  Comment:   Plan: HEMOGLOBIN A1C            786.07 Wheezing  Comment: Differential diagnosis includes acid reflux related sepsis primarily  nocturnal symptoms persist underlying cardiopulmonary etiology although clinically does not have symptoms suggestive of this. Check chest x-ray, continue spirometry versus complete PFTs, further evaluation depending on above  Plan: CHEST 2 VIEWS              Barriers to Learning assessed: none. Patient verbalizes understanding of teaching and instructions.    Electronically Signed By:     Woodward Ku M.D.  Attending Physician  [facility]

## 2011-06-03 ENCOUNTER — Encounter: Payer: Self-pay | Admitting: Internal Medicine

## 2011-06-03 NOTE — Telephone Encounter (Signed)
From: [name]  To: Aurelio Jew, MD  Sent: 06/03/2011 1:16 PM PDT  Subject: Non-urgent Medical Advice Question    Hi Dr. 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 He says no hip replacement in the future...he can fix it.Marland KitchenMarland KitchenI see him 2X/wk...ice it 3X/day....  Dr. Rachell Cipro says labs you requested are perfect.  [name]

## 2011-06-04 ENCOUNTER — Encounter: Payer: Self-pay | Admitting: Internal Medicine

## 2011-06-04 NOTE — Telephone Encounter (Signed)
From: [name]  To: Aurelio Jew, MD  Sent: 06/04/2011 5:32 PM PDT  Subject: Non-urgent Medical Advice Question    Dr.K,  Thank you for the quick response! Might this be the "Reactive" and treated with Indocin as my foot was?  [name]  P.S. I have a few left and will take these

## 2011-06-08 ENCOUNTER — Ambulatory Visit

## 2011-06-09 ENCOUNTER — Ambulatory Visit: Admitting: Rheumatology

## 2011-06-09 VITALS — BP 116/78 | HR 87 | Temp 97.5°F | Wt 245.6 lb

## 2011-06-09 DIAGNOSIS — M4690 Unspecified inflammatory spondylopathy, site unspecified: Secondary | ICD-10-CM

## 2011-06-09 DIAGNOSIS — Z79899 Other long term (current) drug therapy: Secondary | ICD-10-CM

## 2011-06-09 MED ORDER — NAPROXEN 500 MG TABLET
500.0000 mg | ORAL_TABLET | Freq: Two times a day (BID) | ORAL | Status: AC
Start: 2011-06-09 — End: 2012-06-03

## 2011-06-09 NOTE — Progress Notes (Signed)
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 The patient had been doing well until the recent onset of left hip pain which is most notable in the p.m. There is no a.m. stiffness. He has not had any GI or GU symptoms or other joint problems. His activities of daily living are moderately impacted by the hip pain.    Examination: Per homunculus; notable for limitation in range of motion of bilateral hips with tenderness to rotation on the left.    Imaging: 8/12, x-rays of the hips showed OA left greater than right.    Assessment/plan: Left hip pain. This is mostly OA given the characteristics, though hip arthritis is is seen with the spondyloarthropathy. The patient will start Naprosyn 500 mg b.i.d. with Protonix b.i.d. He is aware of the adverse drug reactions including worsening of his transaminasemia. The patient will return in 2 weeks with serial laboratory tests.

## 2011-06-09 NOTE — Nursing Note (Signed)
>>   Lavella Lemons, RN     Wed Jun 09, 2011  2:10 PM  Vital signs taken, allergies verified, pain screened and documented.  Carvel Getting R.N.  Marland Kitchen

## 2011-06-10 ENCOUNTER — Telehealth: Payer: Self-pay | Admitting: GASTROENTEROLOGY

## 2011-06-10 NOTE — Telephone Encounter (Signed)
Spoke with patient and confirmed his egd on 06/17/2011 @ 230pm. Verbalized understanding with his prep. Advise to call back if he has any question.

## 2011-06-11 ENCOUNTER — Ambulatory Visit

## 2011-06-11 LAB — C-REACTIVE PROTEIN: C-Reactive Protein: <0.1 mg/dL (ref 0–0.8)

## 2011-06-14 ENCOUNTER — Encounter: Payer: Self-pay | Admitting: Internal Medicine

## 2011-06-14 ENCOUNTER — Ambulatory Visit

## 2011-06-14 NOTE — Telephone Encounter (Signed)
From: [name]  To: Aurelio Jew, MD  Sent: 06/12/2011 10:56 PM PDT  Subject: Non-urgent Medical Advice Question    Thank you for the results Dr. Kirtland Bouchard!    I still need Dr. Sydnee Levans results but I see him again on 9/20....my arthritis might be the "reactive" versus "osteo".....  [name]

## 2011-06-15 ENCOUNTER — Ambulatory Visit: Admitting: Cardiovascular Disease

## 2011-06-15 NOTE — Nursing Note (Signed)
>>   Ralene Bathe, MA     Tue Jun 15, 2011  1:28 PM  Vitals signs taken,screened for pain,allergies verified, pharmacy verified, EKG done. Ralene Bathe, [place]     >> [facility]     Tue Jun 15, 2011  1:25 PM  Pt's med list updated with information provided by patient.Collier Salina LVN

## 2011-06-16 NOTE — Progress Notes (Signed)
RE:  [name], [name]  MR#:  [id number]  DOB:  13-Jan-1951  Date of Service:  06/15/2011      Joshua Jew, MD    Dear Dr. Felicita Gage:    I had the pleasure of seeing [name] in our Cardiology  Clinic for followup evaluation of his coronary artery disease.    As you know, he is a 60 year old gentleman from [place] who has a  long history of heart disease, hypertension, and elevated lipids.  In  1995, the patient had ischemic cardiac symptoms and underwent  percutaneous coronary intervention.  Over the following years, he had  a total of 25 stents implanted in his coronary arteries while in [place], and 15 of these stents are in the right coronary artery and 10  in the left coronary system.  In 2011, the patient had progressive  disease, and he was found to have severe multivessel disease, at which  time, he was referred to Dr. Renaee Hensley at [facility],  who performed six-vessel coronary artery bypass graft surgery.  The  LIMA was placed to the LAD.  A free right internal mammary artery  graft was placed to diagonal branch.  A left radial artery graft was  placed to the circumflex and vein grafts were placed to the  posterolateral and posterior descending coronary artery, as well as to  the posterior ventricular branch of the right coronary artery.  The  patient made an excellent and uneventful recovery.    Since his prior visit, the patient has continued to do well and has no  ischemic symptoms whatsoever.  He specifically denies chest pain of  angina pectoris or myocardial infarction.  He has no dyspnea,  orthopnea, or paroxysmal nocturnal dyspnea.  He denies palpitations,  dizziness, or syncope.    The patient has had a new problem, which is right hip pain, which he  attributes to osteoarthritis.  He has known HLA-B27 disease but his  symptoms are more suggestive of osteoarthritis and, Dr. Eusebio Hensley, the  rheumatologist, has started him on Naprosyn and PPI.  He notes some  improvement  but it still not enough where he can exercise on the  treadmill or ride a stationary bike.    His present medical regimen includes Naprosyn 500 mg b.i.d.;  allopurinol 300 mg daily; vitamin D3, 5 tablets daily; Ativan 0.5 mg  t.i.d. and HS p.r.n.; atenolol 100 mg b.i.d.; Pataday eye drops;  lisinopril HCT 20/25 one daily; Vytorin 10/40 daily; Protonix 40 mg  b.i.d.; sildenafil 50 mg p.r.n.; Flonase p.r.n.    Physical exam:  Pleasant, overweight male, in no acute distress.  Blood pressure 131/81, pulse 78, respirations 16.  Weight 249 pounds.  Chest:  Clear.  Cardiovascular:  Jugular venous pressure 6 cm of  water.  Carotid pulses normal.  S4 gallop at the apex.  1/6 systolic  murmur at the lower left sternal border.  Extremities:  No edema.    Laboratory:  EKG:  Normal sinus rhythm with single PVC, heart rate 84,  left atrial enlargement, nondiagnostic Q-waves in II, III, and aVF.  Cholesterol is 186, LDL 83, HDL 39, triglycerides 466.  Blood count  normal.  Hemoglobin A1C 5.9.    Electrolytes and renal function not done.    Assessment:  1) Coronary artery disease:  Status post CABG times six  February 2011, [facility], doing well.  No symptoms of  ischemia.  Encouraged continued risk reduction therapy;  2)  Prior PCI  with 25 stents in [place] from 1995 to 2010;  3) Hyperlipidemia:  Good lipid profile;  4) History of gout:  On allopurinol;  5) HLA-B27  disease;  6) Hip pain secondary to osteoarthritis, on Naprosyn;  7)  Hypertension:  Controlled;  8) Tremor:  Atenolol was increased by Dr.  Felicita Gage with improvement;  9) PAF:  Resolved.  Occurred  postoperatively;  10) Obesity:  The patient is going to a weight-  reduction doctor, and he will be started on Phentermine, which is  acceptable.    Plan:  1) Routine followup in one year;  2) Laboratory studies before  next visit;  3) Contact us with any cardiac symptoms prior to next  visit.    I appreciate this opportunity to continue in the care of  [name].  Hopefully, he will be able to effectively lose some weight.    Sincerely,        Report Electronically Signed - 06/22/2011 14:35:35 by  Su Monks, MD  Professor And Chief  Internal Medicine  Cardiovascular Medicine    RIL/co  D:  06/15/2011 14:05:03 PDT/PST  T:  06/16/2011 08:18:29 PDT/PST  Job #:  [id number] / [id number]

## 2011-06-17 ENCOUNTER — Other Ambulatory Visit: Payer: Self-pay | Admitting: GASTROENTEROLOGY

## 2011-06-17 ENCOUNTER — Ambulatory Visit: Admitting: GASTROENTEROLOGY

## 2011-06-17 ENCOUNTER — Encounter: Payer: Self-pay | Admitting: GASTROENTEROLOGY

## 2011-06-17 VITALS — BP 132/85 | HR 85 | Temp 98.0°F | Resp 16 | Ht 71.0 in | Wt 248.7 lb

## 2011-06-17 HISTORY — PX: PR EDG TRANSORAL BIOPSY SINGLE/MULTIPLE: 43239

## 2011-06-17 NOTE — Procedures (Signed)
Patient: [name]  Location:   Medical record number: [id number]  Sex: male  Age: 64yr  Date of birth: 25-Jun-1951    Procedure: Upper Endoscopy  Sub-procedure: biopsy     Date of Service: 06/17/2011    Time start: 1524  Time end: 1529    Referring Physician: PCP: Aurelio Jew, MD, MD.     PERFORMING SURGEON: Roda Shutters, MD. I performed the entire procedure.    ASSISTANT SURGEON: None    GI Pre-procedure Indications:  GERD and Barrett's esophagus     Details of the Procedure:    Informed consent was obtained for the procedure, including sedation. Risks of perforation, hemorrhage, adverse drug reaction, pain, infection and aspiration were discussed. The patient was placed in the left lateral decubitus position. Based on the pre-procedure assessment, including review of the patient's medical history, medications, allergies, and review of systems, he had been deemed to be an appropriate candidate for conscious sedation; he was therefore sedated with the medications listed below. The patient was monitored continuously with EKG tracing, pulse oximetry, blood pressure monitoring, and direct observations.     An oral examination was performed. The Olympus endoscope was inserted into the mouth and advanced under direct vision to the second portion of the duodenum.   A careful inspection was made as the gastroscope was withdrawn, including a retroflexed view of the proximal stomach; findings and interventions are described below. Appropriate photo documentation was obtained. The patient tolerated the procedure well, and there were no complications. He was taken to the recovery area in stable condition.     Sedation:   Sedation was administered for single procedure/procedures  Versed 8 mg IV, Fentanyl 200 mcg IV and Benadryl 40 mg IV     Findings and Interventions:   Esophageal mucosa hiatal hernia, minimally irregular SCJ, bx for hx of Barrett's  Esophageal-gastric junction localized at 40  cm.  Squamo-columnar junction irregular  biopsies obtained  Hiatal hernia present, between 40 cm and 42 cm  Gastric mucosa mild antral gastritis, evidence of retained solid food in stomach possible c/w gastroparesis  Duodenal mucosa normal  Retroflexed view hiatal hernia    Impression:   hiatal hernia and Barrett like changes    Recommendation/Plan:   await pathology  Gastric amptying scan      Roda Shutters, MD   06/17/2011    Roda Shutters, MD  Attending Physician

## 2011-06-17 NOTE — Patient Instructions (Signed)
Discharge Instructions for upper endoscopy    Findings:  upper endoscopy hiatal hernia  Barrett's  Retained solid food    Activity:    Rest today, resume usual activitiy tomorrow     Diet:    Resume usual diet:     Medication:    Resume all usual medications    Follow up:   Call primary care physician for follow-up appointment and proceed with gastric emptying      During your procedure, air was pumped into your GI tract so your doctor could see clearly to make a diagnosis and/or treat your problem.     Some possible side effects you may experience are:   - Discomfort due to a distended (bloated) abdomen which will subside after a few hours to two days.   - Nausea may be a side effect of the medication and will subside.   - The medications you received may make you dizzy and sleepy, it is important that you do not drive, operate machinery or drink alcohol for at least one day.   - Severe pain is not expected and should be reported    Other side effects may include:  Upper Endoscopy: A sore throat can be helped by cough drops, or a salt-water gargle.    If problems, call [facility] at [phone] during business hours Monday through Friday 7:30am to 4:30 pm.  After hours call [phone] and ask to speak to the GI Fellow on call.

## 2011-06-17 NOTE — Progress Notes (Signed)
[id number] Pre-procedure History & Physical     Referring provider: Aurelio Jew  Procedure: egd    Subjective:  HPI: [name] is a 60yr old male who presents for: barrett's    QQV:ZDGLOVFIEPPIRJ: negative.  Eyes: negative.  Ears, Nose, Mouth, Throat: negative.  CV: negative.  Resp: negative.  GI: heartburn or reflux,   GU: negative.  Musculoskeletal: negative.  Integumentary: negative.  Neuro: negative.  Psych: Mood pt's report, euthymic.  Endo: negative.  Heme/Lymphatic: negative.  Allergy/Immun: negative.     I did review all available medical, surgical, personal/social history.    Physical exam:  General Appearance: healthy, alert, no distress, pleasant affect, cooperative.  Nose:  normal.  Mouth: normal.  Neck:  Neck supple. No adenopathy, thyroid symmetric, normal size.  Heart:  normal rate and regular rhythm, no murmurs, clicks, or gallops.  Lungs: clear to auscultation.  Abdomen: BS normal.  Abdomen soft, non-tender.  No masses or organomegaly.  Extremities:  no cyanosis, clubbing, or edema.  Skin:  Skin color, texture, turgor normal. No rashes or lesions.    Airway Assessment:    Normal, Mallampati class 1 and Mouth opening: normal  ROM normal, thyromental distance: 4 finger breadth      Labs/imaging:  Performed at [facility], reviewed: see computer database.  Performed outside of [facility] (reviewed if applicable): na    Impression/ plan:  egd  Patient is a candidate for moderate sedation.  Patient is ASA status: 1    The procedure, risks, benefits, and alternatives were explained.  All patient questions were answered.  The informed consent was signed, and will be scanned into the computer database at a later date.    Patient barriers to learning: none    Patient/family understanding: verbalizes    Roda Shutters, MD

## 2011-06-17 NOTE — Nursing Note (Signed)
>> Elton Sin, RN     Thu Jun 17, 2011  3:37 PM  Patient tolerated procedure well. See MD note. To RR With report.Elton Sin, RN    >> Veronia Beets, RN     Thu Jun 17, 2011  2:06 PM  Nursing Pre-Procedure Assessment    [name] arrived by ambulating  into clinic today at 1353 from home.      Patient has arranged for a ride home from [place] (wife) who can be contacted at wr. Patient agrees to having individual providing ride home present while the post procedure instructions and results are given.  Instructed patient that post sedation they are not to drive or drink alcohol for the remainder of the day.      Verified procedure with the patient scheduled today for: EGD.    Outpatient Prescriptions Marked as Taking for the 06/17/11 encounter (Office Visit) with Roda Shutters, MD:  Allopurinol (ZYLOPRIM) 300 mg Tablet, Take 1 tablet by mouth every day., Disp: 90 tablet, Rfl: 3  Atenolol (TENORMIN) 100 mg Tablet, Take 1 tablet by mouth 2 times daily. ( blood pressure ), Disp: 180 tablet, Rfl: 3  CENTRUM SILVER TAB, take 1 tablet by oral route once daily, Disp: , Rfl:   Cholecalciferol, Vitamin D3, (VITAMIN D) 1,000 unit Tablet, Take 5 tablets by mouth every day., Disp: 100 tablet, Rfl: 3  CO-ENZYME Q-10 100 mg cap, Take 200 mg by mouth every day., Disp: , Rfl:   Ezetimibe 10 mg/Simvastatin 40 mg (VYTORIN) 10-40 mg Tablet, Take 1 tablet by mouth every evening., Disp: 90 tablet, Rfl: 3  Fluticasone (FLONASE) 50 mcg/Actuation nasal spray, Instill 2 Sprays into EACH nostril every day. As needed., Disp: 1 Bottle, Rfl: 12  Lisinopril-Hydrochlorothiazide (PRINZIDE, ZESTORETIC) 20-25 mg per tablet, Take 1 tablet by mouth every morning., Disp: 90 tablet, Rfl: 3  Lorazepam (ATIVAN) 0.5 mg Tablet, 0.5 tablet three times a day and 1 tabs at bedtime, Disp: 150 tablet, Rfl: 5  Naproxen (NAPROSYN) 500 mg Tab Tablet, Take 1 tablet by mouth 2 times daily with meals., Disp: 60 tablet, Rfl: 5  OMEGA-3 FATTY ACIDS PO,  4800 mg daily, Disp: , Rfl:   Pantoprazole (PROTONIX) 40 mg Delayed Release Tablet, Take 1 tablet by mouth 2 times daily., Disp: 180 tablet, Rfl: 3  VITAMIN B-12 1,000 MCG TAB, once daily, Disp: , Rfl:   VITAMIN C PO, 400 mg daily, Disp: , Rfl:       Anticoagulants: Patient has taken Naproxen. Discussed with Dr. Felipa Furnace, and the procedure will continue as planned.  OTC/Herbal preparations: no    Reviewed with the patient his health status in regards to allergies (including latex), medications, pregnancy, hypertension, atrial fibrilation, liver disease, bleeding disorders, diabetes,  CVA's, seizures, sleep apnea, glaucoma, cancer, prosthesis or implants, infectious diseases, surgical history; and disease of heart, lungs, liver, or kidneys. Positive history reviewed and updated in Health History section of the EMR.    Patient Active Problem List:     ESOPHAGEAL REFLUX     BARRETT'S ESOPHAGUS     Benign neoplasm of colon     FAMILY HX GI MALIGNANCY     GOUT UNSPECIFIED     BENIGN ESSENTIAL HYPERTENSION     OTHER AND UNSPECIFIED HYPERLIPIDEMIA     Gout     Impaired fasting blood sugar     INFLAM SPONDYLOPATHY NOS     Angina     CAD (coronary artery disease)     Anxiety  S/P CABG x 6     A-fib     Premature ventricular contractions (PVCs) (VPCs)     Adjustment disorder with depressed mood     Alcohol dependence, continuous     Tremor     -- Keflex (Cephalexin) -- Rash  Past Medical History:    Chronic ischemic heart disease, unspecified     1995            Comment:s/p MI and multiple stents    Esophageal reflux                               04/28/2007     Barrett's esophagus                             04/28/2007     Benign neoplasm of colon                        04/28/2007     Gout, unspecified                               04/28/2007     Essential hypertension, benign                                Other and unspecified hyperlipidemia                          Cardiac arrest                                  1996             Comment:ischemic related     Acute myocardial infarction of other inferior * 1996          Appendicitis, acute                             07/02/2009     Angina                                          10/31/2009     Shortness of breath                                           Coronary artery disease                                       Arthritis                                                     Psychiatric illness  Past Surgical History:    INSERT INTRACORONARY STENT                                      Comment:Stent placement x 25 by report    EGD                                             2006          COLONOSCOPY                                     3/08          REMOVAL OF TONSILS,<12 Y/O                                      Comment:Tonsillectomy-childhood    APPENDECTOMY                                    2010          COLONOSCOPY,DIAGNOSTIC                          08/10/2010      Comment:Colonoscopy    UPPER GI ENDOSCOPY,DIAGNOSIS                    06/17/2011       Comment:EGD    Smoking Status: Former Smoker                   Packs/Day: 2     Years: 10         Types: Cigarettes    Smokeless Status: Never Used                        Comment: quit at age 52      Drug Use: No                Pre Procedure Checks:    Identaband on and two forms of identification information verified: yes  Pt completed bowel prep: GI Procedure  NPO since: 1400  Belongings are with patient under gurney.  Patient has: no glasses, dentures, jewelry, or hearing aides.  Barriers to Learning assessed: none. Patient verbalizes understanding of teaching and instructions.    Past anesthesia responses:  No problem.     Aldrete Score (adapted) Pre Sedation:  Moves 4 extremities voluntarily on command = 2  Spontaneous unlabored respirations = 2  BP changed < 20% of preanesthetic level = 2  Awake, alert, normal response to auditory stimuli = 4  Able to maintain O2 saturation > 92% on room air =  2  TOTAL PRE SEDATION ALDRETE SCORE: 12    Nursing Assessment Data Base:  Ventilation   --  Respirations: normal, unlabored.  Circulation/Perfusion -- Skin: warm, normal color and dry.  Cognition/Communication -- Behavior: alert and oriented and cooperative.  Gastro-Intestinal: no distress.  Abdomen: soft and non-tender.  Level of Ambulation: self.    Nursing assessment completed  by:   Veronia Beets, RN, RN    Date: 06/17/2011 Time: [id number]

## 2011-06-24 ENCOUNTER — Ambulatory Visit: Admitting: Rheumatology

## 2011-06-24 DIAGNOSIS — M1610 Unilateral primary osteoarthritis, unspecified hip: Secondary | ICD-10-CM

## 2011-06-24 DIAGNOSIS — M4690 Unspecified inflammatory spondylopathy, site unspecified: Secondary | ICD-10-CM

## 2011-06-24 MED ORDER — CYCLOBENZAPRINE 5 MG TABLET
5.0000 mg | ORAL_TABLET | Freq: Two times a day (BID) | ORAL | Status: DC | PRN
Start: 2011-06-24 — End: 2012-06-06

## 2011-06-24 NOTE — Nursing Note (Signed)
>>   Ann Maki, MA     Thu Jun 24, 2011 11:39 AM  vitals taken, allergies and medications reviewed, pain level recorded.  Diane Joya Gaskins MAII

## 2011-06-24 NOTE — Progress Notes (Signed)
The patient is seen in followup for his hip pains which are improved with the Naprosyn. The patient continues to have problems with the back muscle spasms which are worse in the p.m. The symptoms moderately affect his activities of daily living.He    Laboratories: 9/12, CRP less than 0.1.    Imaging: 9/wall, x-rays of the hip showed OA left greater than right.    Assessment/plan: Hip OA. The patient is improving with the Naprosyn 500 mg b.i.d. With Protonix. He will be prescribed cyclobenzaprine 5 mg q.h.s. p.r.n. for his back muscle spasms. Adverse drug reactions were discussed. He will return in followup in one month.

## 2011-06-30 ENCOUNTER — Encounter: Payer: Self-pay | Admitting: GASTROENTEROLOGY

## 2011-07-05 ENCOUNTER — Ambulatory Visit: Admitting: Internal Medicine

## 2011-07-08 ENCOUNTER — Ambulatory Visit: Admitting: Internal Medicine

## 2011-07-14 ENCOUNTER — Ambulatory Visit

## 2011-07-14 ENCOUNTER — Ambulatory Visit: Admitting: Internal Medicine

## 2011-07-14 ENCOUNTER — Encounter: Payer: Self-pay | Admitting: Internal Medicine

## 2011-07-14 VITALS — BP 121/86 | HR 79 | Temp 97.2°F | Resp 18 | Ht 71.0 in | Wt 248.1 lb

## 2011-07-14 DIAGNOSIS — K2950 Unspecified chronic gastritis without bleeding: Secondary | ICD-10-CM | POA: Insufficient documentation

## 2011-07-14 NOTE — Nursing Note (Signed)
>>   BRENDA VARGAS     Wed Jul 14, 2011  8:42 AM  The Inactivated Influenza Vaccine VIS document was given to patient to review and any questions were referred to the physician. The Influenza Vaccine was then administered per protocol. The patient was observed for immediate reactions to the vaccine per protocol. None were observed.   Elease Hashimoto     >> STEFANIE MURPHY     Wed Jul 14, 2011  8:08 AM  Vital signs taken, allergies verified, screened for pain, med hx taken,  screened for chicken pox, and verified immunization status.  [name], MA

## 2011-07-14 NOTE — Progress Notes (Signed)
[name] is a 60yr old male presents for follow-up several issues. Continues to have significant musculoskeletal concerns. Naproxen is helpful but does not completely resolve symptoms. Recent evaluation notable for bilateral hip osteoarthritis, also with increased back pain/spasms. Not able to exercise as usual due to this. Denies radicular leg pain, new neurological symptoms associated with above. Has been seeing a massage therapist which is partially helpful. Went to physical therapy, since initial evaluation notable for pain related to hip arthritis did not return for further treatment.    Also with questions regarding recent EGD results. Denies any significant increased GI symptoms, continues on Protonix. Unfortunately continues to drink as usual.    No other significant new symptoms. Denies chest pain, shortness of breath. Reports compliance with medications.    Patient Active Problem List   Diagnoses    ESOPHAGEAL REFLUX    Barrett's esophagus    Benign neoplasm of colon    FAMILY HX GI MALIGNANCY    GOUT UNSPECIFIED    BENIGN ESSENTIAL HYPERTENSION    OTHER AND UNSPECIFIED HYPERLIPIDEMIA    Gout    Impaired fasting blood sugar    INFLAM SPONDYLOPATHY NOS    Angina    CAD (coronary artery disease)    Anxiety    S/P CABG x 6    A-fib    Premature ventricular contractions (PVCs) (VPCs)    Adjustment disorder with depressed mood    Alcohol dependence, continuous    Tremor     History   Substance Use Topics    Smoking status: Former Smoker -- 2.0 packs/day for 10 years     Types: Cigarettes    Smokeless tobacco: Never Used    Comment: quit at age 60    Alcohol Use: Yes      chardonnay mostly; 1-2 bottles of wine per day on average       OBJECTIVE:  BP 121/86  Pulse 79  Temp(Src) 36.2 C (97.2 F) (Tympanic)  Resp 18  Ht 1.803 m (5\' 11" )  Wt 112.537 kg (248 lb 1.6 oz)  BMI 34.60 kg/m2  He appears well, in no apparent distress.  Alert and oriented, pleasant and cooperative.  Mental  status exam: he is alert, oriented to time, person and place. Normal thought content, speech, affect and grooming are noted.       Impression/Plan:      724.5 Back pain  (primary encounter diagnosis)  Comment: recent x-ray notable for lumbar degenerative changes, findings on pelvis/hip x-ray. Discussed in detail with patient. Since not having any neurological or impingement related symptoms recommend return to physical therapy to address back pain  Plan: is not having significant improvement, consider referral to pain management/MRI    715.95 Hip osteoarthritis  Comment: appears to have partial benefit from NSAIDs, if this is limiting ability to rehabilitation back/resume exercise program May want to consider a trial of fluoroscopically guided injection  Plan: follow up one months, sooner as needed     535.10 Chronic gastritis  Comment: pathology results reviewed with patient. Prior history of Barrett's esophagus but no evidence of Barrett's on recent pathology/biopsy.  Discussed adverse effects of continued alcohol use, continue PPI, careful use of NSAIDs, monitor for worsening symptoms  Plan: follow up as needed       V04.81 Need for prophylactic vaccination and inoculation against influenza  Comment:   Plan: INFLUENZA VACCINE (3 YO AND OLDER), NO LATEX ,         NO PRESERVATIVE  I spent with the patient, 20 minutes of which were spent counseling and coordinating care as noted above.      Barriers to Learning assessed: none. Patient verbalizes understanding of teaching and instructions.    Electronically Signed By:     Woodward Ku M.D.  Attending Physician  [facility]

## 2011-07-22 ENCOUNTER — Ambulatory Visit: Admitting: Rheumatology

## 2011-07-22 ENCOUNTER — Encounter: Payer: Self-pay | Admitting: Rheumatology

## 2011-07-22 DIAGNOSIS — M1610 Unilateral primary osteoarthritis, unspecified hip: Secondary | ICD-10-CM

## 2011-07-22 DIAGNOSIS — M4690 Unspecified inflammatory spondylopathy, site unspecified: Secondary | ICD-10-CM

## 2011-07-22 NOTE — Nursing Note (Signed)
>>   Haskell Flirt, MA     Thu Jul 22, 2011 11:04 AM  patient screened, vitals completed, pharmacy verified, pain assessed.  ERobinson MA1

## 2011-07-22 NOTE — Progress Notes (Signed)
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 The patient's left hip pains are improved with the Naprosyn plus physical therapy. The patient was not able to tolerate the cyclobenzaprine for the back spasms related to his antalgic gait. The patient is able to exercise, but not impact exercises.    Examination: Per homunculus; notable for limitation in range of rotation of both hips right greater than left.    Assessment/plan: Left hip OA, B-27+ spondyloarthropathy. The patient is stable and will continue his Naprosyn 500 mg b.i.d. and physical therapy. He will return in 3 months.

## 2011-07-22 NOTE — Telephone Encounter (Signed)
From: [name]  To: Ave Filter, MD  Sent: 07/22/2011 3:07 PM PDT  Subject: Non-urgent Medical Advice Question    Hi Dr. Eusebio Me,  I meant to ask you about lifting weights (dumbells)...after my 42 minutes on the upright stationary bike (I cannot use a recumbent) I do my stretches. I was wondering if lifting weights would be ok...sitting down and laying down...no standing??  Thank you,  [name]

## 2011-08-11 ENCOUNTER — Ambulatory Visit

## 2011-08-13 ENCOUNTER — Encounter: Payer: Self-pay | Admitting: Internal Medicine

## 2011-08-13 ENCOUNTER — Ambulatory Visit: Admitting: Internal Medicine

## 2011-08-13 VITALS — BP 116/79 | HR 83 | Temp 97.3°F | Resp 18 | Ht 71.0 in | Wt 248.5 lb

## 2011-08-13 MED ORDER — EZETIMIBE 10 MG-SIMVASTATIN 40 MG TABLET
1.0000 | ORAL_TABLET | Freq: Every day | ORAL | Status: DC
Start: 2011-08-13 — End: 2012-11-01

## 2011-08-13 MED ORDER — LORAZEPAM 0.5 MG TABLET
ORAL_TABLET | ORAL | Status: DC
Start: 2011-08-13 — End: 2011-10-06

## 2011-08-13 MED ORDER — SILDENAFIL 50 MG TABLET
1.0000 | ORAL_TABLET | Freq: Every day | ORAL | Status: DC | PRN
Start: 2011-08-13 — End: 2012-03-17

## 2011-08-13 NOTE — Nursing Note (Signed)
>>   STEFANIE MURPHY     Fri Aug 13, 2011  8:13 AM  Vital signs taken, allergies verified, screened for pain, med hx taken,  screened for chicken pox, and verified immunization status.  [name], MA

## 2011-08-13 NOTE — Progress Notes (Signed)
[name] is a 60yr old male presents for followup multiple issues, labs, meds.    Generally doing well his last visit.  Not drinking as much.  Continues to see physical therapy/massage therapist, helpful with back/leg/hip pain.  Has had to stop doing the treadmill he to musculoskeletal issues.  Currently using an exercise bike, 45 minutes essentially daily.  Tolerating this well without significant chest pain, shortness of breath.  Denies orthopnea, PND, increased edema.  Recent labs stable, improved triglycerides, improved LFTs.  Blood sugar unfortunately elevated, patient reports ate 2 bowls of ice cream prior to this blood draw.  Hemoglobin A1c otherwise stable.  Continues on naproxen, helpful with musculoskeletal pain.  Recent rheumatology evaluation, no med changes.  Needs new prescriptions.  Continues on lorazepam, no significant medication side effects, controlled anxiety/tremor.  Continues on atenolol which has helped with tremor, no significant bradycardia or lightheadedness or dizziness.  Blood pressure has been stable.    Patient Active Problem List   Diagnoses    ESOPHAGEAL REFLUX    Barrett's esophagus    Benign neoplasm of colon    FAMILY HX GI MALIGNANCY    GOUT UNSPECIFIED    BENIGN ESSENTIAL HYPERTENSION    OTHER AND UNSPECIFIED HYPERLIPIDEMIA    Gout    Impaired fasting blood sugar    INFLAM SPONDYLOPATHY NOS    Angina    CAD (coronary artery disease)    Anxiety    S/P CABG x 6    A-fib    Premature ventricular contractions (PVCs) (VPCs)    Adjustment disorder with depressed mood    Alcohol dependence, continuous    Tremor    Chronic gastritis    LOC PRIM OSTEOART-PELVIS      History   Substance Use Topics    Smoking status: Former Smoker -- 2.0 packs/day for 10 years     Types: Cigarettes    Smokeless tobacco: Never Used    Comment: quit at age 60    Alcohol Use: Yes      chardonnay mostly; 1-2 bottles of wine per day on average       OBJECTIVE:  BP 116/79  Pulse 83   Temp(Src) 36.3 C (97.3 F) (Tympanic)  Resp 18  Ht 1.803 m (5\' 11" )  Wt 112.719 kg (248 lb 8 oz)  BMI 34.66 kg/m2  He appears well, in no apparent distress.  Alert and oriented, pleasant and cooperative.  Eye exam is normal; sclera nonicteric, conjuctiva not injected.  Neck is supple and free of adenopathy or masses, the thyroid is normal without enlargement, palpable nodules or tenderness. No carotid bruits.  Chest is clear to auscultation bilaterally. No wheezing, crackles or rhonchi. Normal symmetric air entry throughout both lung fields. No chest wall tenderness.  Heart exam shows regular rate and rhythm. No murmurs, rubs or gallops. No edema or JVD.  Mental status exam: he is alert, oriented to time, person and place. Normal thought content, speech, affect, mood and grooming are noted.         Impression/Plan:      790.22 Glucose intolerance (impaired glucose tolerance)  (primary encounter diagnosis)  Comment: Discussed improved dietary adherence, periodic lap monitoring, followup 3 months  Plan: HEMOGLOBIN A1C, BASIC METABOLIC PANEL,         MICROALBUMIN, CREATININE SPOT URINE            272.4 Other and unspecified hyperlipidemia  Comment: Improved control, continue meds  Plan: LIPID PANEL WITH DLDL REFLEX  401.1 BENIGN ESSENTIAL HYPERTENSION  Comment: Controlled  Plan: MICROALBUMIN, CREATININE SPOT URINE        Continue management    414.00 CAD (coronary artery disease)  Comment: Clinically stable, labs up-to-date  Plan: Continue current management    300.00 Anxiety  Comment: Stable, encouraged to continue efforts at decreasing overall alcohol intake, regular exercise, no medication changes for now, consider tapering benzodiazepine dose in the Joshua  Plan: follow up 3 months, sooner as needed     781.0 Tremor  Comment: Controlled on current management  Plan: Continue current meds      Barriers to Learning assessed: none. Patient verbalizes understanding of teaching and  instructions.    Electronically Signed By:     Woodward Ku M.D.  Attending Physician  [facility]

## 2011-09-06 ENCOUNTER — Ambulatory Visit

## 2011-09-09 ENCOUNTER — Ambulatory Visit

## 2011-09-10 ENCOUNTER — Ambulatory Visit

## 2011-09-23 ENCOUNTER — Encounter: Payer: Self-pay | Admitting: Internal Medicine

## 2011-09-23 NOTE — Telephone Encounter (Signed)
From: [name]  To: Aurelio Jew, MD  Sent: 09/23/2011 2:53 PM PST  Subject: Non-urgent Medical Advice Question    Hi Dr. Kirtland Bouchard,  99.99% sure I have a sinus infection....have been fighting it for weeks now....getting worse not better. Used to get one every year...haven't in a longtime. Clear fluid wouldn't stop.Marland KitchenMarland KitchenMarland Kitchennow it's bright green.  [name]

## 2011-09-24 ENCOUNTER — Ambulatory Visit: Admitting: Internal Medicine

## 2011-09-24 VITALS — BP 128/85 | HR 80 | Temp 97.2°F | Resp 18 | Wt 257.5 lb

## 2011-09-24 MED ORDER — AMOXICILLIN 875 MG-POTASSIUM CLAVULANATE 125 MG TABLET
1.0000 | ORAL_TABLET | Freq: Two times a day (BID) | ORAL | Status: AC
Start: 2011-09-24 — End: 2011-10-04

## 2011-09-24 MED ORDER — FLUTICASONE PROPIONATE 50 MCG/ACTUATION NASAL SPRAY,SUSPENSION
2.0000 | Freq: Every day | NASAL | Status: DC
Start: 2011-09-24 — End: 2012-11-16

## 2011-09-24 NOTE — Progress Notes (Signed)
SUBJECTIVE: [name] is a 60yr old male who comes in today for evaluation of symptoms of a possible sinus infection which he has had for the past 10-14 days or longer. He has had nasal congestion, purulent rhinorrhea, post nasal drip, wheezing and dypsnea. Since this began the symptoms are gradually worsening.  He denies  fever, chills and ear pain bilaterally. He reports some relief from over the counter nyquil. He does not have a history of chronic/recurrent sinus infections recently but has had problems in the past. He does not smoke.    Still exercising on bike. Not last 2 days. No exercise induced chest pain or shortness of breath, no anginal symptoms.     Patient Active Problem List   Diagnoses    ESOPHAGEAL REFLUX    Barrett's esophagus    Benign neoplasm of colon    FAMILY HX GI MALIGNANCY    GOUT UNSPECIFIED    BENIGN ESSENTIAL HYPERTENSION    OTHER AND UNSPECIFIED HYPERLIPIDEMIA    Gout    Impaired fasting blood sugar    INFLAM SPONDYLOPATHY NOS    Angina    CAD (coronary artery disease)    Anxiety    S/P CABG x 6    A-fib    Premature ventricular contractions (PVCs) (VPCs)    Adjustment disorder with depressed mood    Alcohol dependence, continuous    Tremor    Chronic gastritis    LOC PRIM OSTEOART-PELVIS      History   Substance Use Topics    Smoking status: Former Smoker -- 2.0 packs/day for 10 years     Types: Cigarettes    Smokeless tobacco: Never Used    Comment: quit at age 70    Alcohol Use: Yes      chardonnay mostly; 1-2 bottles of wine per day on average         OBJECTIVE: BP 128/85  Pulse 80  Temp(Src) 36.2 C (97.2 F) (Tympanic)  Resp 18  Wt 116.801 kg (257 lb 8 oz)  General Appearance: Alert, oriented x3, cooperative, no distress.  ENT: bilateral TM normal without fluid or infection, neck without nodes, throat normal without erythema or exudate and bilateral sinuses tender.   Lungs: clear to auscultation  Heart: Regular rate and rhythm. No murmurs, extra  heart sounds.      Impression/Plan:      461.9 Acute sinusitis  (primary encounter diagnosis)  Comment: medications per orders   Plan: follow up sooner as needed       Barriers to Learning assessed: none. Patient verbalizes understanding of teaching and instructions.    Electronically Signed By:     Woodward Ku M.D.  Attending Physician   [facility]

## 2011-10-06 ENCOUNTER — Encounter: Payer: Self-pay | Admitting: Internal Medicine

## 2011-10-06 MED ORDER — LORAZEPAM 0.5 MG TABLET
ORAL_TABLET | ORAL | Status: DC
Start: 2011-10-06 — End: 2011-10-07

## 2011-10-06 NOTE — Telephone Encounter (Signed)
From: [name]  To: Aurelio Jew, MD  Sent: 10/05/2011 10:32 PM PST  Subject: Non-urgent Medical Advice Question    HAPPY & HEALTHY NEW YEAR Dr. Kirtland Bouchard,  My Rx for Lorazepam 0.5 mg is not renewable? [facility] you [phone]. Thank you!  [name]

## 2011-10-06 NOTE — Telephone Encounter (Signed)
Prescription printed. Please fax manually.  Deyra Perdomo M.D.

## 2011-10-07 ENCOUNTER — Encounter: Payer: Self-pay | Admitting: Internal Medicine

## 2011-10-07 MED ORDER — LORAZEPAM 0.5 MG TABLET
0.5000 mg | ORAL_TABLET | Freq: Three times a day (TID) | ORAL | Status: DC
Start: 2011-10-07 — End: 2012-04-26

## 2011-10-07 NOTE — Telephone Encounter (Signed)
Faxed the patient prescription on 10/07/11.  At 6

## 2011-10-07 NOTE — Telephone Encounter (Signed)
From: [name]  To: Aurelio Jew, MD  Sent: 10/07/2011 2:04 PM PST  Subject: Non-urgent Medical Advice Question    Hi Dr. Kirtland Bouchard.  the Lorazepam Rx is not correct....should be 1 3x/day and 2@bedtime ....this says 1 at bed time and therefore I only received a 24 day supply (120 vs. 150)...[facility]ally [facility] misc[facility] only [facility] but they'll fix that. No need to fix now unless you want to for the records. I have to go back tomorrow to pick up the missing 10. Thank you.  [name]

## 2011-10-15 ENCOUNTER — Ambulatory Visit

## 2011-10-28 ENCOUNTER — Encounter: Payer: Self-pay | Admitting: Rheumatology

## 2011-10-28 ENCOUNTER — Ambulatory Visit: Admitting: Rheumatology

## 2011-10-28 ENCOUNTER — Ambulatory Visit

## 2011-10-28 DIAGNOSIS — M4690 Unspecified inflammatory spondylopathy, site unspecified: Secondary | ICD-10-CM

## 2011-10-28 DIAGNOSIS — M1610 Unilateral primary osteoarthritis, unspecified hip: Secondary | ICD-10-CM

## 2011-10-28 NOTE — Nursing Note (Signed)
>>   Haskell Flirt, MA     Thu Oct 28, 2011  8:31 AM  patient screened, vitals completed, pharmacy verified, pain assessed.  ERobinson MA1

## 2011-10-28 NOTE — Progress Notes (Signed)
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  The patient primarily has pain in his left hip which limits his weightbearing.  His stiffness is only a few minutes.  His activities these of daily living are moderately impacted.  He has not had any GI or GU symptoms.  He wishes a referral to orthopedics.    Examination: Per homonculus; notable for swelling of the right third MCP and LROM of bilateral hips.    Laboratories: 1/13, LFTs were notable for AST 52.  11/12, CBC was normal.    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  The patient elects to continue his Naprosyn 500 mg twice a day.  He'll be referred to orthopedics.  He will return in 6 months for followup.

## 2011-11-03 ENCOUNTER — Ambulatory Visit: Admitting: Internal Medicine

## 2011-11-03 ENCOUNTER — Telehealth: Payer: Self-pay | Admitting: Internal Medicine

## 2011-11-03 ENCOUNTER — Encounter: Payer: Self-pay | Admitting: Internal Medicine

## 2011-11-03 VITALS — BP 124/83 | HR 84 | Temp 98.2°F | Resp 18 | Wt 257.0 lb

## 2011-11-03 MED ORDER — ALBUTEROL SULFATE HFA 90 MCG/ACTUATION AEROSOL INHALER
1.0000 | INHALATION_SPRAY | RESPIRATORY_TRACT | Status: DC | PRN
Start: 2011-11-03 — End: 2012-04-20

## 2011-11-03 MED ORDER — AMOXICILLIN 875 MG-POTASSIUM CLAVULANATE 125 MG TABLET
1.0000 | ORAL_TABLET | Freq: Two times a day (BID) | ORAL | Status: AC
Start: 2011-11-03 — End: 2011-11-13

## 2011-11-03 MED ORDER — FLUTICASONE 100 MCG/ACTUATION DISK POWDER FOR ORAL INHALATION
1.0000 | DISK | Freq: Two times a day (BID) | RESPIRATORY_TRACT | Status: DC
Start: 2011-11-03 — End: 2012-02-16

## 2011-11-03 NOTE — Progress Notes (Signed)
[name] is a 61yr old male presents for ongoing sinus infection/respiratory concerns.  Previous visit, treated for sinusitis with Augmentin, symptoms improved but did not completely resolved by the time he was done with antibiotic course.  Did not followup right away, was hopeful that symptoms would resolve, unfortunately over the last month has had progressively worsening symptoms.  Worsening sinus congestion, drainage, colored drainage, pressure, no significant fever or chills.  Has had worsening cough and chest congestion, recently also noted some increased shortness of breath and fatigue with exercise.  Denies chest pain.  Denies any orthopnea or PND.  No increased edema.  Denies palpitations.    Review of systems also notable for mild diarrhea for a few days on antibiotics but denies any persistent diarrhea, nausea or vomiting or significant abnormal pain.    Patient Active Problem List   Diagnoses    ESOPHAGEAL REFLUX    Barrett's esophagus    Benign neoplasm of colon    FAMILY HX GI MALIGNANCY    GOUT UNSPECIFIED    BENIGN ESSENTIAL HYPERTENSION    OTHER AND UNSPECIFIED HYPERLIPIDEMIA    Gout    Impaired fasting blood sugar    INFLAM SPONDYLOPATHY NOS    Angina    CAD (coronary artery disease)    Anxiety    S/P CABG x 6    A-fib    Premature ventricular contractions (PVCs) (VPCs)    Adjustment disorder with depressed mood    Alcohol dependence, continuous    Tremor    Chronic gastritis    LOC PRIM OSTEOART-PELVIS      History   Substance Use Topics    Smoking status: Former Smoker -- 2.0 packs/day for 10 years     Types: Cigarettes    Smokeless tobacco: Never Used    Comment: quit at age 61    Alcohol Use: Yes      chardonnay mostly; 1-2 bottles of wine per day on average       OBJECTIVE:  BP 124/83  Pulse 84  Temp(Src) 36.8 C (98.2 F) (Tympanic)  Resp 18  Wt 116.574 kg (257 lb)  SpO2 99%  He appears well, in no apparent distress.  Alert and oriented, pleasant and  cooperative.  Eye exam is normal; sclera nonicteric, conjuctiva not injected.  Ear exam shows normal external ear canals and TM's.   Oropharynx is clear without lesions or exudate.   Neck is supple and free of adenopathy or masses  Chest is fairly clear to auscultation, no significant wheezing or crackles.  Heart is regular rate  No pitting edema.      Impression/Plan:      786.2 Cough  (primary encounter diagnosis)  Comment: Likely secondary to postnasal drip/upper respiratory symptoms/sinus infection that was partially treated and now worse  Chest x-ray negative for pneumonia; Formal interpretation by Radiology pending.   Discussed with patient.  Recommend starting antibiotics again, continue Flonase, add Flovent for cough/reactive bronchitis symptoms  Followup sooner if worsening symptoms, does not completely resolved, otherwise followup 2 weeks  Plan: CHEST 2 VIEWS            786.05 SOB (shortness of breath)  Comment: see above   Plan: CHEST 2 VIEWS              Barriers to Learning assessed: none. Patient verbalizes understanding of teaching and instructions.    Electronically Signed By:     Woodward Ku M.D.  Attending Physician  [facility]

## 2011-11-03 NOTE — Telephone Encounter (Signed)
Patient called in regarding needing an appointment with his pcp Dr. Felicita Gage only due to a possible sinus infection and stated he had the following symptoms:    1. Do you have a Nasal discharge? Yes   2. Do you have Nasal congestion? Yes  3. Are you sneezing? Yes  4. Do you have a coughing/ sore throat? No  5.  Have you noticed any feeling of fatigue, weakness? Yes  6. Have you tried any medication?Not as of yet  7. How long have you had your symptoms? "About a week"    The patient is requesting a call back to see if he can be worked in sooner than 11/10/11. Patient did decline to see any other physician.      Thank you!

## 2011-11-03 NOTE — Telephone Encounter (Signed)
Can work in today at [employer] or tomorrow at noon.  Annie Roseboom M.D.

## 2011-11-03 NOTE — Telephone Encounter (Signed)
Spoke with patient.  Made  Appointment for the patient to see Dr. Felicita Gage.  Joshua Newer  MA I

## 2011-11-03 NOTE — Nursing Note (Signed)
>>   STEFANIE MURPHY     Wed Nov 03, 2011  1:50 PM  Vital signs taken, allergies verified, screened for pain, med hx taken,  screened for chicken pox, and verified immunization status.  [name], MA

## 2011-11-10 ENCOUNTER — Ambulatory Visit

## 2011-11-15 ENCOUNTER — Ambulatory Visit

## 2011-11-17 ENCOUNTER — Encounter: Payer: Self-pay | Admitting: Internal Medicine

## 2011-11-17 ENCOUNTER — Ambulatory Visit: Admitting: Internal Medicine

## 2011-11-17 VITALS — BP 90/62 | HR 64 | Temp 96.5°F | Resp 16 | Wt 247.0 lb

## 2011-11-17 NOTE — Nursing Note (Signed)
>>   Ann Maki, MA     Wed Nov 17, 2011  8:32 AM  vitals taken, allergies and medications reviewed, pain level recorded.  Diane Joya Gaskins MAII

## 2011-11-17 NOTE — Progress Notes (Signed)
[name] is a 61yr old male presents for follow up sinus/breathing concerns and follow up labs.    Doing much better. Sinus symptoms resolved. Breathing well. Stopped inhalers about 5 days ago. Last dose of antibiotics this am. Exercising well without shortness of breath or difficulty. Also trying to lose weight. Low carb diet. Stopped drinking alcohol with his diet x 2 days.     Patient Active Problem List   Diagnoses    ESOPHAGEAL REFLUX    Barrett's esophagus    Benign neoplasm of colon    FAMILY HX GI MALIGNANCY    GOUT UNSPECIFIED    BENIGN ESSENTIAL HYPERTENSION    OTHER AND UNSPECIFIED HYPERLIPIDEMIA    Gout    Impaired fasting blood sugar    INFLAM SPONDYLOPATHY NOS    Angina    CAD (coronary artery disease)    Anxiety    S/P CABG x 6    A-fib    Premature ventricular contractions (PVCs) (VPCs)    Adjustment disorder with depressed mood    Alcohol dependence, continuous    Tremor    Chronic gastritis    LOC PRIM OSTEOART-PELVIS      OBJECTIVE:  BP 90/62  Pulse 64  Temp(Src) 35.8 C (96.5 F) (Tympanic)  Resp 16  Wt 112.038 kg (247 lb)  He appears well, in no apparent distress.  Alert and oriented, pleasant and cooperative.  Eye exam is normal; sclera nonicteric, conjuctiva not injected.  Ear exam shows normal external ear canals and TM's.   Oropharynx is clear without lesions or exudate.   Neck is supple and free of adenopathy or masses  Chest is clear to auscultation bilaterally. No wheezing, crackles or rhonchi.   Heart exam shows regular rate and rhythm. No murmurs, rubs or gallops. No edema        Impression/Plan:      786.2 Cough  (primary encounter diagnosis)  Comment: Improved.  Recommend monitor for recurrent sinus symptoms, worsening cough post antibiotics.  Continue Flonase.  Plan: follow up as needed     786.09 Other dyspnea and respiratory abnormality  Comment: Improved, discussed staying on Flovent for another 2 weeks prior to discontinuing to decrease risk of  recurrent symptoms.  Plan: Followup as needed    303.91 Alcohol dependence, continuous  Comment: Encouraged to continue off alcohol, improved liver enzymes recently, emphasized benefits on mood as well as above.  Plan: follow up as needed       Barriers to Learning assessed: none. Patient verbalizes understanding of teaching and instructions.    Electronically Signed By:     Woodward Ku M.D.  Attending Physician  [facility]

## 2011-11-17 NOTE — Telephone Encounter (Signed)
From: [name]  To: Aurelio Jew, MD  Sent: 11/17/2011 4:31 PM PST  Subject: Non-urgent Medical Advice Question    Hi Dr. Kirtland Bouchard,  Can you please forward my blood test results when you get a chance? Many thanks, [name]

## 2011-12-08 ENCOUNTER — Encounter: Payer: Self-pay | Admitting: Internal Medicine

## 2011-12-08 MED ORDER — ALLOPURINOL 300 MG TABLET
1.0000 | ORAL_TABLET | Freq: Every day | ORAL | Status: DC
Start: 2011-12-08 — End: 2012-12-15

## 2011-12-08 MED ORDER — LISINOPRIL 20 MG-HYDROCHLOROTHIAZIDE 25 MG TABLET
1.0000 | ORAL_TABLET | Freq: Every day | ORAL | Status: DC
Start: 2011-12-08 — End: 2012-12-15

## 2011-12-08 MED ORDER — PANTOPRAZOLE 40 MG TABLET,DELAYED RELEASE
1.0000 | DELAYED_RELEASE_TABLET | Freq: Two times a day (BID) | ORAL | Status: DC
Start: 2011-12-08 — End: 2012-12-15

## 2011-12-08 NOTE — Telephone Encounter (Signed)
From: [name]  To: Aurelio Jew, MD  Sent: 12/08/2011 11:24 AM PST  Subject: Non-urgent Medical Advice Question    Hi Dr. Kirtland Bouchard,  I have 3 Rx's that need refills.Marland KitchenMarland KitchenAllopurinol 300 mg, Lisinop/HCTZ 20/25 mg, and Pantoprazole 40 mg....these would go to [facility] delivery please.  I stopped the inhaler on 3/2 but I am still wheezing and coughing up "stuff"??  [name]

## 2011-12-09 MED ORDER — ATENOLOL 100 MG TABLET
100.0000 mg | ORAL_TABLET | Freq: Two times a day (BID) | ORAL | Status: DC
Start: 2011-12-09 — End: 2012-03-17

## 2011-12-09 NOTE — Telephone Encounter (Signed)
Addended by: [name] on: 12/09/2011 08:32 AM     Modules accepted: Orders

## 2011-12-10 ENCOUNTER — Ambulatory Visit: Admitting: SPORTS MEDICINE

## 2011-12-10 ENCOUNTER — Ambulatory Visit

## 2011-12-10 DIAGNOSIS — M169 Osteoarthritis of hip, unspecified: Secondary | ICD-10-CM | POA: Insufficient documentation

## 2011-12-10 NOTE — Nursing Note (Signed)
>>   Katrina Stack     Fri Dec 10, 2011  8:00 AM  Allergies verified, screened for pain, DOB verified

## 2011-12-10 NOTE — Consults (Signed)
PATIENTEDWIN, [name]  MR #:  [id number]  DOB:  07-02-1951  SEX:  M  AGE:  60  CONSULTATION DATE:  12/10/2011    [place] CONSULTATION    Dear [name]:    Thank you very much for requesting a consultation on behalf of [name], date of birth 03-04-1951, whom I had the pleasure of seeing  in sports medicine clinic.  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  There was no injury.  No trauma, twist or fall, but  the pain was worse with standing for a prolonged period of time.  When  it was very bad, it was hurting him at night, worse getting up and  down from a toilet, in and out of a car, and he had several weekends  he remembers almost being immobilized because of the pain.  He started  physical therapy, which did improve him significantly.  He  transitioned from a treadmill to a stationary bike.  He has to be on  an exercise program for cardiac rehabilitation.  X-rays were obtained,  demonstrating advanced bilateral hip osteoarthritis, left greater than  right side.  He denies any past or remote history of trauma.  No  family history of hip disorders whatsoever.  He notes that he has pain  that comes and goes at night.  Pain worse going up or down stairs.  None getting in and out of a car and up and down from the toilet.  He  is not interested in any anti-inflammatory injections.    PAST HISTORY:  Surgery:  Status post coronary artery bypass graft.  Medical Illnesses:  GERD, Barrett's esophagitis, gout, hypertension,  hyperlipidemia, inflammatory spondyloarthropathy, coronary artery  disease, atrial fibrillation, depression, alcohol dependence, hip  osteoarthritis.    CURRENT MEDICATIONS:  Include vitamin D, atenolol, allopurinol,  lisinopril and hydrochlorothiazide, Protonix, albuterol, fluticasone,  Ativan, Vytorin, Viagra, Flexeril, Naprosyn, vitamin D,  calcium,  coenzyme Q, omega-3 fatty acids, vitamin C supplementation.    ALLERGIES:  To Keflex.    HABITS:  He does not smoke.  He does use alcohol on an ongoing basis.      PHYSICAL EXAMINATION:  On physical exam, he is an alert, pleasant,  cooperative gentleman in no acute distress.  Gait and posture are  normal.  Left hip reveals pain with internal range of motion.  Negative straight leg raise.  Positive Patrick's test.  No  exacerbation of pain to hip flexion, abduction or adduction against  resistance.      X-rays were reviewed as above.    ASSESSMENT:  Left hip advanced osteoarthritis.    PLAN:  [name] and I discussed the risks and benefits of an ultrasound-  guided cortisone injection.  At this stage, he is not interested in  pursuing that.  I have made a referral for him to be seen by the  orthopaedic joint team.    Thank you very much for your kind request for consultation.    I have educated/instructed the patient or caregiver regarding all  aspects of the above stated plan of care.  The patient or caregiver  acknowledges understanding of plan of care.    Sincerely yours,      Report Electronically Signed - 12/10/2011 13:50:18 by  Bailey Mech, MD  Associate Medical Director  Sports Medicine  [facility]    JLT/MODL  D:  12/10/2011 08:27:56 PDT/PST  T:  12/10/2011 09:05:56 PDT/PST  Job #:  [id number] / [id number]

## 2011-12-13 ENCOUNTER — Encounter: Payer: Self-pay | Admitting: Rheumatology

## 2011-12-13 NOTE — Telephone Encounter (Signed)
From: [name]  To: Ave Filter, MD  Sent: 12/10/2011 7:01 PM PST  Subject: Non-urgent Medical Advice Question    Hello Dr. Eusebio Me,   Well, I saw Dr. Dahlia Byes this morning and must say I was very disappointed. I had said to you that I wanted to go ahead with hip surgery...you replied very well, and sent me to him. Why? All he wanted to do was give me a shot....and it was pretty obvious he wasn't happy that I did not want the shot.Marland KitchenMarland KitchenMarland KitchenI guess he wanted the business. Now I have to wait for who knows how long to see "the group" in [place]. I waited 8 weeks for this 15 minutes of nothing.  On another subject....you sent me the x-ray results of my hands.Marland KitchenMarland KitchenI have no clue what they mean.....  Thank you for your attention,  [name]

## 2011-12-15 ENCOUNTER — Ambulatory Visit

## 2012-01-24 ENCOUNTER — Encounter: Payer: Self-pay | Admitting: Rheumatology

## 2012-01-24 DIAGNOSIS — M4690 Unspecified inflammatory spondylopathy, site unspecified: Secondary | ICD-10-CM

## 2012-01-24 MED ORDER — INDOMETHACIN 50 MG CAPSULE
50.0000 mg | ORAL_CAPSULE | Freq: Three times a day (TID) | ORAL | Status: DC
Start: 2012-01-24 — End: 2012-06-06

## 2012-01-24 NOTE — Telephone Encounter (Signed)
From: [name]  To: Ave Filter, MD  Sent: 01/24/2012 6:55 AM PDT  Subject: Non-urgent Medical Advice Question    Hello Dr. Eusebio Me,  I am in considerable pain with my left hip even at night. I stopped the Naproxen many months ago because it became ineffective and have been relying on aspirin with codeine ([place]). What really helps is Indocin (I had a few left from my foot) and was wondering if you would call in an RX to [facility] [phone]. Thank you!  [name]

## 2012-02-04 ENCOUNTER — Ambulatory Visit

## 2012-02-04 ENCOUNTER — Ambulatory Visit: Admitting: ORTHOPAEDIC SURGERY

## 2012-02-04 VITALS — BP 108/58 | HR 88 | Temp 97.2°F | Ht 71.0 in | Wt 239.1 lb

## 2012-02-04 NOTE — Nursing Note (Signed)
>>   Talmage Coin, MA     Fri Feb 04, 2012  3:33 PM  Pt was not able to wait to be seen and left before being seen. Otho Perl RN/Dr Alan Mulder informed.

## 2012-02-07 ENCOUNTER — Encounter: Payer: Self-pay | Admitting: Internal Medicine

## 2012-02-07 NOTE — Telephone Encounter (Signed)
From: [name]  To: Aurelio Jew, MD  Sent: 02/04/2012 4:48 PM PDT  Subject: Non-urgent Medical Advice Question    Hi Dr. Kirtland Bouchard,  Just wanted to let you know that I just got home from my 2:30 appointment with Dr. Alan Mulder [place]....only I never got to see him. This was a disgrace....after moving to 4 different rooms they left me in a room for an hour....never said a word about why I was just sitting there??? After one hour I got up and said have a nice day...she asked if I wanted to make another appointment??16 weeks of waiting for this.Marland KitchenMarland KitchenMarland KitchenI am going to try [facility]....just unbelievable.  [name]

## 2012-02-07 NOTE — Progress Notes (Signed)
No show

## 2012-02-16 ENCOUNTER — Encounter: Payer: Self-pay | Admitting: Internal Medicine

## 2012-02-16 ENCOUNTER — Ambulatory Visit

## 2012-02-16 ENCOUNTER — Ambulatory Visit: Admitting: Internal Medicine

## 2012-02-16 VITALS — BP 138/94 | HR 79 | Temp 97.5°F | Resp 18 | Wt 241.0 lb

## 2012-02-16 MED ORDER — PREDNISONE 20 MG TABLET
ORAL_TABLET | ORAL | Status: DC
Start: 2012-02-16 — End: 2012-02-17

## 2012-02-16 NOTE — Progress Notes (Signed)
[name] is a 61yr old male presents for follow up chronic medical concerns/meds.    Reports ongoing wheezing.  Recall initial symptoms in the setting of respiratory infection in the fall/winter.  Although symptoms resolved has continued to have wheezing.  Partial, at best, response to nasal steroids/inhaled steroids.  Chest x-ray negative for significant acute cardiopulmonary disease in January of 2013.  Currently reports clear rhinitis, presumed allergy related, continues on Flonase.  Also with clear sputum/cough.  Denies orthopnea PND.  No wheezing is fairly persistent, present both during the daytime and at night but not clearly worsened with any exercise/activity.  Has been able to continue to use stationary bike without increased shortness of breath.  Denies chest pain, anginal symptoms.  No increased lower extremity edema.  No palpitations.  Continues to drink alcohol.  Reports compliance with meds otherwise.    Patient Active Problem List   Diagnosis    ESOPHAGEAL REFLUX    Barrett's esophagus    Benign neoplasm of colon    FAMILY HX GI MALIGNANCY    GOUT UNSPECIFIED    BENIGN ESSENTIAL HYPERTENSION    OTHER AND UNSPECIFIED HYPERLIPIDEMIA    Gout    Impaired fasting blood sugar    INFLAM SPONDYLOPATHY NOS    Angina    CAD (coronary artery disease)    Anxiety    S/P CABG x 6    A-fib    Premature ventricular contractions (PVCs) (VPCs)    Adjustment disorder with depressed mood    Alcohol dependence, continuous    Tremor    Chronic gastritis    LOC PRIM OSTEOART-PELVIS    Hip osteoarthritis      History   Substance Use Topics    Smoking status: Former Smoker -- 2.00 packs/day for 10 years     Types: Cigarettes    Smokeless tobacco: Never Used    Comment: quit at age 61    Alcohol Use: Yes      chardonnay mostly; 1-2 bottles of wine per day on average       Review of Systems -  negative for increased acid reflux/abdominal symptoms.  No prior history of asthma.    OBJECTIVE:  BP  138/94  Pulse 79  Temp(Src) 36.4 C (97.5 F) (Oral)  Resp 18  Wt 109.317 kg (241 lb)  BMI 33.63 kg/m2  He appears well, in no apparent distress.  Alert and oriented, pleasant and cooperative.  Eye exam is normal; sclera nonicteric, conjuctiva not injected.  Ear exam shows normal external ear canals and TM's.   Oropharynx is clear without lesions or exudate.   Neck is supple and free of adenopathy or masses  Chest is mostly clear to auscultation, only noted end expiratory wheezing upper chest with forced expiration.  Intermittently audible wheezing while patient is talking as well  Heart exam shows regular rate and rhythm. No murmurs, rubs or gallops. No edema or JVD.    Last labs about 3 months ago.  Stable including normal renal function, stable lipids and liver enzymes.       Impression/Plan:      (786.07) Wheezing  (primary encounter diagnosis)  Comment: Spirometry testing in the office shows no significant documented benefit with bronchodilator.  FVC/FEV1 decreased with normal ratio suggestive of mild restriction.   Reviewed with patient.  Persistent significant symptoms for several months duration.  Clinically appears well otherwise.  Trial of prednisone taper, consider pulmonary referral for further evaluation if symptoms are not resolved post prednisone  Potential medication side effects reviewed with patient.  Plan: BREATHING CAPACITY TEST, PredniSONE (DELTASONE)        20 mg Tablet            (401.1) BENIGN ESSENTIAL HYPERTENSION  Comment: Borderline blood pressure control today  Plan: Discussed with patient, increased attention to diet/exercise and weight loss  Continue meds unchanged for now, followup one month, sooner if needed    (414.00) CAD (coronary artery disease)  Comment: Clinically stable, no evidence of CHF clinically  Update labs prior to next visit  Plan: B-TYPE NATRIURETIC PEPTIDE              Barriers to Learning assessed: none. Patient verbalizes understanding of teaching and  instructions.    Electronically Signed By:     Woodward Ku M.D.  Attending Physician  [facility]

## 2012-02-16 NOTE — Patient Instructions (Signed)
Schedule fasting lab appointment.

## 2012-02-16 NOTE — Nursing Note (Signed)
>>   Lavella Lemons, RN     Wed Feb 16, 2012  9:21 AM  Pre and post bronchodilator Spirometry performed.  2 puffs albuterol was taken with aerochamber.  Instructions in use of an aerochamber discussed with patient.  Verbalized understanding.  Lavella Lemons, RN    >> [name]     Wed Feb 16, 2012  8:38 AM  Vital signs taken, allergies verified, screened for pain, med hx taken,  screened for chicken pox, and verified immunization status.  [name], MA

## 2012-02-17 ENCOUNTER — Encounter: Payer: Self-pay | Admitting: Internal Medicine

## 2012-02-17 MED ORDER — PREDNISONE 20 MG TABLET
ORAL_TABLET | ORAL | Status: AC
Start: 2012-02-17 — End: 2012-02-27

## 2012-02-17 NOTE — Telephone Encounter (Signed)
From: [name]  To: Aurelio Jew, MD  Sent: 02/17/2012 10:47 AM PDT  Subject: Non-urgent Medical Advice Question    Hi Dr. Kirtland Bouchard,  The Rx for the Prednisone was sent to [facility]....that will take weeks to receive.....[facility] [place]??  Thank you.....  [name]

## 2012-02-21 ENCOUNTER — Encounter: Payer: Self-pay | Admitting: Internal Medicine

## 2012-02-21 NOTE — Telephone Encounter (Signed)
From: [name]  To: Aurelio Jew, MD  Sent: 02/20/2012 12:14 PM PDT  Subject: Non-urgent Medical Advice Question    HAPPY # 41.Marland KitchenMarland KitchenMarland KitchenMarland Kitchenplease tell Dr.K the wheezing stopped on the very first day of the Prednisone.....amazing!  [name]

## 2012-02-24 ENCOUNTER — Ambulatory Visit

## 2012-02-24 LAB — COMPREHENSIVE METABOLIC PANEL
Alanine Transferase (ALT): 88 U/L — ABNORMAL HIGH (ref 6–63)
Albumin: 4.2 g/dL (ref 3.2–4.6)
Alkaline Phosphatase (ALP): 46 U/L (ref 35–115)
Aspartate Transaminase (AST): 71 U/L — ABNORMAL HIGH (ref 15–43)
Bilirubin Total: 1 mg/dL (ref 0.3–1.3)
Calcium: 9.8 mg/dL (ref 8.6–10.5)
Carbon Dioxide Total: 23 mEq/L — ABNORMAL LOW (ref 24–32)
Chloride: 101 mEq/L (ref 95–110)
Creatinine Blood: 0.88 mg/dL (ref 0.44–1.27)
E-GFR, African American: >60 SEE NOTE (ref >60)
E-GFR, Non-African American: >60 SEE NOTE (ref >60)
Glucose: 132 mg/dL — ABNORMAL HIGH (ref 70–99)
Potassium: 4.4 mEq/L (ref 3.3–5.0)
Protein: 7.1 g/dL (ref 6.3–8.3)
Sodium: 138 mEq/L (ref 135–145)
Urea Nitrogen, Blood (BUN): 22 mg/dL (ref 8–22)

## 2012-02-24 LAB — CBC WITH DIFFERENTIAL
Basophils % Auto: 0.4 %
Basophils Abs Auto: 0 10*3/uL (ref 0–0.2)
Eosinophils % Auto: 2.5 %
Eosinophils Abs Auto: 0.2 10*3/uL (ref 0–0.5)
Hematocrit: 45.2 % (ref 41–53)
Hemoglobin: 15.5 GM/DL (ref 13.5–17.5)
Lymphocytes % Auto: 24.3 %
Lymphocytes Abs Auto: 2 10*3/uL (ref 1.0–4.8)
MCH: 34.2 PG — ABNORMAL HIGH (ref 27–33)
MCHC: 34.3 % (ref 32–36)
MCV: 99.8 UM3 (ref 80–100)
MPV: 7.9 UM3 (ref 6.8–10.0)
Monocytes % Auto: 12.8 %
Monocytes Abs Auto: 1 10*3/uL — ABNORMAL HIGH (ref 0.1–0.8)
Neutrophils % Auto: 60 %
Neutrophils Abs Auto: 4.9 10*3/uL (ref 1.80–7.70)
Platelet Count: 156 10*3/uL (ref 130–400)
RDW: 13.8 UNITS (ref 0–14.7)
Red Blood Cell Count: 4.53 10*6/uL (ref 4.5–5.9)
White Blood Cell Count: 8.1 10*3/uL (ref 4.5–11.0)

## 2012-02-24 LAB — C-REACTIVE PROTEIN: C-Reactive Protein: <0.1 mg/dL (ref 0–0.8)

## 2012-03-17 ENCOUNTER — Encounter: Payer: Self-pay | Admitting: Internal Medicine

## 2012-03-17 ENCOUNTER — Ambulatory Visit: Admitting: Internal Medicine

## 2012-03-17 ENCOUNTER — Ambulatory Visit

## 2012-03-17 VITALS — BP 127/84 | HR 90 | Temp 96.8°F | Wt 238.7 lb

## 2012-03-17 DIAGNOSIS — E119 Type 2 diabetes mellitus without complications: Secondary | ICD-10-CM

## 2012-03-17 HISTORY — DX: Type 2 diabetes mellitus without complications: E11.9

## 2012-03-17 MED ORDER — SILDENAFIL 100 MG TABLET
100.0000 mg | ORAL_TABLET | ORAL | Status: AC | PRN
Start: 2012-03-17 — End: 2013-03-12

## 2012-03-17 MED ORDER — METOPROLOL TARTRATE 100 MG TABLET
100.0000 mg | ORAL_TABLET | Freq: Two times a day (BID) | ORAL | Status: DC
Start: 2012-03-17 — End: 2012-05-26

## 2012-03-17 NOTE — Patient Instructions (Addendum)
claritin (loratadine) 10 mg daily (do not use for one week prior to allergy appointment)     Schedule appointments with Dr Eusebio Me and Dr Burt Knack     Fasting lipid panel within one months

## 2012-03-17 NOTE — Progress Notes (Signed)
[name] is a 61yr old male presents for follow up.    Continues to have intermittent wheezing.  Initially  excellent response to steroid taper, however noted recurrent symptoms while still on medication/taper.  Was out of the area, finishing in [place], interestingly enough does not recall having significant wheezing or need for inhaler at that time.  Reports symptoms appear to be worse in the morning.  Was worse today also while exercising.  Only using albuterol intermittently if persistent symptoms.  Not on other inhalers.  Denies increased acid reflux, continues on the PMI.  Denies chest pain or shortness of breath or palpitations.  No increased edema.  Has been trying to lose weight with the supervised weight loss program in [place], has had a plateau in any response.  Still drinks too much.  Recent labs notable for persistent elevated glucose in mild diabetes range.  Hemoglobin A1c otherwise stable at 5.8.  Lipids were elevated compared to baseline, unclear why.  Reports has been consistent and compliant with his cholesterol medication.  Does not have a prior history of asthma but does have a history of allergies.  While living in [place] underwent some skin testing and allergy evaluation and was initially started on immunotherapy but due to job/scheduling never completed.  Has noted some increased allergy symptoms since living in this area.  Intermittently has used some over-the-counter cold and sinus medication but not consistently.  In the past did not appear to have much benefit from oral antihistamines.    Patient Active Problem List   Diagnosis    ESOPHAGEAL REFLUX    Barrett's esophagus    Benign neoplasm of colon    FAMILY HX GI MALIGNANCY    GOUT UNSPECIFIED    BENIGN ESSENTIAL HYPERTENSION    OTHER AND UNSPECIFIED HYPERLIPIDEMIA    Gout    Impaired fasting blood sugar    INFLAM SPONDYLOPATHY NOS    Angina    CAD (coronary artery disease)    Anxiety    S/P CABG x 6    A-fib     Premature ventricular contractions (PVCs) (VPCs)    Adjustment disorder with depressed mood    Alcohol dependence, continuous    Tremor    Chronic gastritis    LOC PRIM OSTEOART-PELVIS    Hip osteoarthritis      History   Substance Use Topics    Smoking status: Former Smoker -- 2.00 packs/day for 10 years     Types: Cigarettes    Smokeless tobacco: Never Used    Comment: quit at age 22    Alcohol Use: Yes      chardonnay mostly; 1-2 bottles of wine per day on average       OBJECTIVE:  BP 127/84  Pulse 90  Temp(Src) 36 C (96.8 F) (Tympanic)  Wt 108.274 kg (238 lb 11.2 oz)  BMI 33.31 kg/m2  He appears well, in no apparent distress.  Alert and oriented, pleasant and cooperative.  No apparent wheezing at time of visit.  Eye exam is normal; sclera nonicteric, conjuctiva not injected..   Neck is supple and free of adenopathy or masses, the thyroid is normal without enlargement, palpable nodules or tenderness. No carotid bruits.  Chest is clear to auscultation bilaterally. No wheezing, crackles or rhonchi. Normal symmetric air entry throughout both lung fields. No chest wall tenderness.  Heart exam shows regular rate and rhythm. No murmurs, rubs or gallops. No edema    Previous spirometry only notable for mild restriction.  Previous chest x-ray earlier this year negative for significant abnormal findings to account for wheezing.      Impression/Plan:      (401.1) BENIGN ESSENTIAL HYPERTENSION  (primary encounter diagnosis)  Comment: Controlled  Plan: Has been on atenolol for years however unclear if this is in part contributing to his persistent wheezing.  Was switched to metoprolol per orders, monitor symptoms/blood pressure    (272.4) Other and unspecified hyperlipidemia  Comment: Worsened control recently, unclear why  Plan: Discussed continue management for now, followup labs sooner than initially planned, further evaluation pending on followup results    (414.00) CAD (coronary artery disease)  Comment:  Clinically stable, no recurrent anginal symptoms.  Good exercise capacity reported  Plan: LIPID PANEL WITH DLDL REFLEX            (786.07) Wheezing  Comment: Unclear etiology.  Possible asthma versus allergy induced versus other etiology  Further evaluation recommended with both allergy and pulmonary, continue with when necessary albuterol for now  Plan: PULMONARY REFERRAL, ALLERGY REFERRAL          Of note, discussed diabetes diagnosis with patient, he defers on referral to dietitian at this time, would like to still work on weight loss and dietary changes on his own until followup in about 2-3 months      Barriers to Learning assessed: none. Patient verbalizes understanding of teaching and instructions.    Electronically Signed By:     Woodward Ku M.D.  Attending Physician  [facility]

## 2012-03-17 NOTE — Nursing Note (Signed)
>>   Haskell Flirt, MA     Fri Mar 17, 2012 10:32 AM  patient screened, vitals completed, pharmacy verified, pain assessed.  ERobinson MA1

## 2012-03-20 ENCOUNTER — Ambulatory Visit

## 2012-03-23 ENCOUNTER — Other Ambulatory Visit: Payer: Self-pay | Admitting: Internal Medicine

## 2012-03-31 ENCOUNTER — Encounter: Payer: Self-pay | Admitting: Internal Medicine

## 2012-03-31 NOTE — Telephone Encounter (Signed)
From: [name]  To: Aurelio Jew, MD  Sent: 03/31/2012 4:08 PM PDT  Subject: Non-urgent Medical Advice Question    Hi Dr. Kirtland Bouchard,  Well.Marland KitchenMarland KitchenMarland KitchenLafonda Hensley lost her job on Monday....our insurance ends on 6/30.Marland KitchenMarland KitchenMarland KitchenI need to cancel/postpone all my July appointments.Marland KitchenMarland Kitchen7/3 Dr Burt Knack; 7/12 blood lab; 7/22 Dr. Alan Mulder; and 7/26 with you. This will be until we get COBRA set up. We just now received the letter and it looks like it will be about a month. I will call Westley Hummer now and see if she can help. So sorry for the trouble.  [name]

## 2012-04-03 ENCOUNTER — Ambulatory Visit

## 2012-04-05 ENCOUNTER — Ambulatory Visit: Admitting: Internal Medicine

## 2012-04-13 ENCOUNTER — Other Ambulatory Visit: Payer: Self-pay | Admitting: ORTHOPAEDIC SURGERY

## 2012-04-13 DIAGNOSIS — M25559 Pain in unspecified hip: Secondary | ICD-10-CM

## 2012-04-14 ENCOUNTER — Ambulatory Visit

## 2012-04-20 ENCOUNTER — Encounter: Payer: Self-pay | Admitting: Internal Medicine

## 2012-04-20 ENCOUNTER — Other Ambulatory Visit: Payer: Self-pay | Admitting: Internal Medicine

## 2012-04-20 NOTE — Telephone Encounter (Signed)
Prescription sent.  Lilliemae Fruge M.D.

## 2012-04-20 NOTE — Telephone Encounter (Signed)
The patient was advised that his prescription was sent.  Stefanie Murphy  MA I

## 2012-04-20 NOTE — Telephone Encounter (Signed)
From: [name]  To: Aurelio Jew, MD  Sent: 04/20/2012 10:22 AM PDT  Subject: Non-urgent Medical Advice Question    Hi Dr. Kirtland Bouchard,  we are back on insurance again [facility]) and I have rescheduled all my appointments. Can you [facility]. The wheezing has not improved and this helps very much. Thank you!  [name]

## 2012-04-20 NOTE — Telephone Encounter (Signed)
See refill under telephone encounter.  Merelin Human M.D.

## 2012-04-20 NOTE — Telephone Encounter (Signed)
Patient called and stated that he called pharmacy and they do not have an approval on his albuterol. Would like a call back.     [name]  M[facility] II

## 2012-04-24 ENCOUNTER — Ambulatory Visit: Admitting: ORTHOPAEDIC SURGERY

## 2012-04-24 VITALS — BP 136/72 | HR 79 | Temp 97.7°F | Resp 20 | Ht 70.98 in | Wt 246.1 lb

## 2012-04-24 NOTE — Progress Notes (Signed)
This patient was seen, evaluated, and care plan was developed with the resident.  I agree with the assessment and plan as outlined in the resident's note.  I was also present for the entire procedure.  Report electronically signed by Paulette Blanch, MD. 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  Left groin pain not as bad as loss of function and difficulty in walking  Can only walk 1/4 - 1/2 mile  Reduced ROM left hip  Limp    25x stents  6xCABG    HAR for left THR

## 2012-04-24 NOTE — Progress Notes (Signed)
61 year old male with significant history of CAD, s/p CABG in 2008, presents to clinic with one year of left hip pain. 6/10 pain that is a dull pain but can be sharp with ambulation. It is non-radiating, localized to left groin. He has stopped walking on a treadmill for the past 6 months, but still rides a stationary bike. He attended therapy for 4-5 months this fall and they instructed him on stretching and strengthening exercises for him to do at home, this helped but did not resolve his pain. The pain has limited his mobility and daily activity. He is sometimes woken up by the pain at night. Pt is very interested in proceeding with THA.     PMHx: CAD with 25 stents last was drug eluting in Feb 2008, and 6 vessel CABG Feb 2011. Wheezing for one year controlled by albuterol, HLA B27 positive arthritis right ankle, Gout  PSHx: Appendectomy, ORIF right ankle 1991, Stents Feb 2008, CABG 2011  Meds: Albuterol, Allopurinol, Flexeril, simvastatin, HCTZ, Lorazepam, Naproxen, ASA + codeine  Allergies: Keflex - rash  Social: Retired Scientist, product/process development, lives with wife who may be limited in care taker duties. Denies smoking, drinks a bottle of wine a day, denies recreational drug use.    Physical Exam: Pt ambulates without assistance with a slightly stiff legged gait. Bilateral 2+ dorsal pedis pulse and posterior tibial pulse. 5/5 power bilateral lower extremities, sensation intact sural, saphenous, sup peroneal, deep peroneal, tibial dist. Right hip flexion to 120 degrees with full extension, internal rotation of 10 degrees and external rotation of 45 degrees. Left hip had flexion of 90 degrees and full extension. Internal rotation 0 deg and external rotation of 10 degrees.    Imaging: Xray L hip shows OA with joint space narrowing.    Assessment: 61 year old man with OA of left hip that is limiting mobility and activity.    Plan: We have recommended and will proceed with THA, scheduling form was submitted today. He will be  seen back in clinic for preop following risk assessment my his PCP.  Risks of the procedure including bleeding, infection, pain, leg length discrepancy, need for more surgery and neurovascular damage were discussed and he would like to proceed.      Onita Pfluger R. Maude Leriche, MD  Bartholome Bill [phone], PGY V  Orthopaedic Surgery, Pg [phone]

## 2012-04-24 NOTE — Nursing Note (Signed)
>>   DORIS SINGLETON, LVN     Mon Apr 24, 2012  8:01 AM  Vital signs taken;  Medication & Allergies reviewed.   Pain scale: 6/10. Pt roomed;       Joseph Art, [place]

## 2012-04-24 NOTE — Patient Instructions (Signed)
Hip Pain: After Your Visit  Your Care Instructions  Hip pain may be caused by many things, including overuse, a fall, or a twisting movement. Another cause of hip pain is arthritis. Your pain may increase when you stand up, walk, or squat. The pain may come and go or may be constant.  Home treatment can help relieve hip pain, swelling, and stiffness. If your pain is ongoing, you may need more tests and treatment.  Follow-up care is a key part of your treatment and safety. Be sure to make and go to all appointments, and call your doctor if you are having problems. It's also a good idea to know your test results and keep a list of the medicines you take.  How can you care for yourself at home?   Take pain medicines exactly as directed.    If the doctor gave you a prescription medicine for pain, take it as prescribed.    If you are not taking a prescription pain medicine, ask your doctor if you can take an over-the-counter medicine.    Rest and protect your hip. Take a break from any activity, including standing or walking, that may cause pain.    Put ice or a cold pack against your hip for 10 to 20 minutes at a time. Try to do this every 1 to 2 hours for the next 3 days (when you are awake) or until the swelling goes down. Put a thin cloth between the ice and your skin.    Sleep on your healthy side with a pillow between your knees, or sleep on your back with pillows under your knees.    If there is no swelling, you can put moist heat, a heating pad, or a warm cloth on your hip. Do gentle stretching exercises to help keep your hip flexible.    Learn how to prevent falls. Have your vision and hearing checked regularly. Wear slippers or shoes with a nonskid sole.    Stay at a healthy weight.    Wear comfortable shoes.   When should you call for help?  Call 911 anytime you think you may need emergency care. For example, call if:   You have sudden chest pain and shortness of breath, or you cough up blood.     You are not able to stand or walk or bear weight.    Your buttocks, legs, or feet feel numb or tingly.    Your leg or foot is cool or pale or changes color.    You have severe pain.   Call your doctor now or seek immediate medical care if:   You have signs of infection, such as:    Increased pain, swelling, warmth, or redness in the hip area.    Red streaks leading from the hip area.    Pus draining from the hip area.    A fever.    You have signs of a blood clot, such as:    Pain in your calf, back of the knee, thigh, or groin.    Redness and swelling in your leg or groin.    You are not able to bend, straighten, or move your leg normally.    You have trouble urinating or having bowel movements.   Watch closely for changes in your health, and be sure to contact your doctor if:   You do not get better as expected.     Where can you learn more?    Go to   https://www.healthwise.net/patiented   Enter Z720 in the search box to learn more about "Hip Pain: After Your Visit."     2006-2012 Healthwise, Incorporated. Care instructions adapted under license by [facility]. This care instruction is for use with your licensed healthcare professional. If you have questions about a medical condition or this instruction, always ask your healthcare professional. Healthwise, Incorporated disclaims any warranty or liability for your use of this information.  Content Version: 9.4.94723; Last Revised: December 25, 2009

## 2012-04-26 ENCOUNTER — Encounter: Payer: Self-pay | Admitting: Internal Medicine

## 2012-04-26 ENCOUNTER — Other Ambulatory Visit: Payer: Self-pay | Admitting: Internal Medicine

## 2012-04-26 MED ORDER — LORAZEPAM 0.5 MG TABLET
0.5000 mg | ORAL_TABLET | Freq: Three times a day (TID) | ORAL | Status: DC
Start: 2012-04-26 — End: 2012-11-01

## 2012-04-26 NOTE — Telephone Encounter (Signed)
duplicate

## 2012-04-26 NOTE — Telephone Encounter (Signed)
From: [name]  To: Aurelio Jew, MD  Sent: 04/25/2012 5:25 PM PDT  Subject: Non-urgent Medical Advice Question    Hi Dr. Kirtland Bouchard,  I need authorization for refilling my Lorazepam....0.5 mg.Marland Kitchen..150/30 days....[phone]....many thanks...  [name]

## 2012-04-28 ENCOUNTER — Ambulatory Visit: Admitting: Internal Medicine

## 2012-05-11 ENCOUNTER — Ambulatory Visit

## 2012-05-11 ENCOUNTER — Encounter: Payer: Self-pay | Admitting: Rheumatology

## 2012-05-11 ENCOUNTER — Ambulatory Visit: Admitting: Rheumatology

## 2012-05-11 VITALS — BP 157/90 | HR 75 | Temp 97.0°F | Ht 70.0 in | Wt 247.7 lb

## 2012-05-11 DIAGNOSIS — J301 Allergic rhinitis due to pollen: Secondary | ICD-10-CM | POA: Insufficient documentation

## 2012-05-11 DIAGNOSIS — J45909 Unspecified asthma, uncomplicated: Secondary | ICD-10-CM

## 2012-05-11 DIAGNOSIS — H1010 Acute atopic conjunctivitis, unspecified eye: Secondary | ICD-10-CM | POA: Insufficient documentation

## 2012-05-11 HISTORY — DX: Unspecified asthma, uncomplicated: J45.909

## 2012-05-11 MED ORDER — FLUTICASONE 44 MCG/ACTUATION AEROSOL ORAL INHALER
2.0000 | INHALATION_SPRAY | Freq: Two times a day (BID) | RESPIRATORY_TRACT | Status: DC
Start: 2012-05-11 — End: 2012-06-06

## 2012-05-11 NOTE — Progress Notes (Signed)
Dear Dr. Felicita Gage:    Thank you very much for your request of an allergy consultation on [name], a 60 year old retired white male.  The patient states that he has had nasal congestion, runny nose, sneezing, and itchy nose and eyes since age 54 living in the area.  It was fairly well controlled with Advil Cold and sinus; fluticasone nasal spray wasrecently started.  In the past yea,r he has had problems with shortness of breath, cough and wheezing particularly at night, occurring from without seasonal variation.  He was prescribed an albuterol MDI with benefit.  Dust and grasses seem to make his symptoms worse.    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  Family history is positive for sister with asthma.  Social history: The patient was a former smoker; he lives in a house in [place] with central air conditioning, carpeting and dogs.    Objective data: BP 157/90;.  Pulse 75.  Afebrile.  Weight 247.  HEENT: Conjunctivae, tympanic membranes and pharynx were clear.  Nasal mucosa showed minimal swelling with septal deviation to the right.  Neck was supple without masses.  Chest was clear to auscultation.  C.-V.: No murmurs rubs.  Skin: No acute rashes.    Spirometry: FVC 3.91 (83%), FEV1 2.77, FEV1/FVC 71%.  Postbronchodilator FVC 4.03 (86%), FEV1 3.07, FEV1/FVC 76%.    34 antigen prick skin tests was positive to grasses, [place], early/late trees, weeds, cats, dogs.    Assessment/plan: Allergic rhinoconjunctivitis.  The patient defers on immunotherapy presently and will use his fluticasone nasal spray and Advil Cold and sinus.    Problem #2: Asthma: The patient had a 10% improvement post-BD for which Flovent 44 2 puffs twice a day was prescribed.  His symptoms are compounded by his GERD with  nocturnal problems for which he will add Zantac 75 mg at bedtime to his omeprazole twice a day.  A handout on GERD was provided.  The patient return in 3 weeks.    I appreciate being able to assist in his care.

## 2012-05-11 NOTE — Nursing Note (Signed)
>>   Lavella Lemons, RN     Thu May 11, 2012 11:35 AM  Post bronchodilator spirometry performed after 2 puffs albuterol taken.  Lavella Lemons, RN    #34 prick skin tests applied and read at 20 minutes.  Lavella Lemons, RN    >> Lavella Lemons, RN     Thu May 11, 2012 10:47 AM  Spirometry performed.  Lavella Lemons, RN    >> ROSA Delford Field, [place]     Thu May 11, 2012 10:40 AM  Vital signs taken, allergies verified, screened for pain, smoking status reviewed, pharmacy verified.  Breaks, [place].

## 2012-05-15 ENCOUNTER — Ambulatory Visit

## 2012-05-17 ENCOUNTER — Encounter: Payer: Self-pay | Admitting: Internal Medicine

## 2012-05-17 ENCOUNTER — Ambulatory Visit: Admitting: Internal Medicine

## 2012-05-17 VITALS — BP 153/92 | HR 81 | Temp 97.2°F | Wt 248.9 lb

## 2012-05-17 MED ORDER — FLUTICASONE-SALMETEROL 500 MCG-50 MCG/DOSE DISK POWDER FOR ORAL INHALATION
1.0000 | DISK | Freq: Two times a day (BID) | RESPIRATORY_TRACT | Status: DC
Start: 2012-05-17 — End: 2012-08-24

## 2012-05-17 NOTE — Nursing Note (Signed)
>>   Haskell Flirt, MA     Wed May 17, 2012  9:57 AM  patient screened, vitals completed, pharmacy verified, pain assessed.  ERobinson MA1

## 2012-05-17 NOTE — Telephone Encounter (Signed)
From: [name]  To: Aurelio Jew, MD  Sent: 05/17/2012 11:45 AM PDT  Subject: Non-urgent Medical Advice Question    Hi Dr. Kirtland Bouchard,  I had my blood labs done on Monday morning.Marland KitchenMarland KitchenMarland KitchenMarland Kitchenjust wondering if the results are back yet?  Thank you  [name]

## 2012-05-22 ENCOUNTER — Encounter: Payer: Self-pay | Admitting: Internal Medicine

## 2012-05-22 NOTE — Telephone Encounter (Signed)
From: [name]  To: Vedia Pereyra, MD  Sent: 05/22/2012 12:16 PM PDT  Subject: Non-urgent Medical Advice Question    Hi Dr. Burt Knack,  I wanted to let you know that the Advair has brought the wheezing to a halt which is great. However, I still have shortness of breath and tightness....especially if I do anything physical. You got me thinking about my heart and have become somewhat anxious over the fact that it might be an issue here. I am now going to write to Dr. Felicita Gage and ask her to call in an Rx for nitro....just as a Landscape architect.Marland KitchenMarland KitchenMarland KitchenI will be seeing her on Friday 8/23. If you have any thoughts please advise.  many thanks  [name]

## 2012-05-22 NOTE — Telephone Encounter (Signed)
From: [name]  To: Aurelio Jew, MD  Sent: 05/22/2012 12:24 PM PDT  Subject: Non-urgent Medical Advice Question    Hi Dr. Kirtland Bouchard,  I saw Dr. Burt Knack on 8/14...Marland Kitchenhe gave me Advair 500/50 and the wheezing has come to a halt except when I do anything physical. I still have the shortness of breath and tightness in the chest. I believe it is the asthma but am a little anxious about my heart being an issue. 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 It would reduce the anxiety just having it in my pocket. Thank you. I have advised Dr. Burt Knack and also want to let you know that we have an appointment Friday a.m. and I see Dr. Rachell Cipro on 9/3...until then I am laying real low.Marland KitchenMarland KitchenMarland KitchenI have had plenty of practice over the years :)  many thanks  [name]

## 2012-05-25 ENCOUNTER — Ambulatory Visit

## 2012-05-26 ENCOUNTER — Encounter: Payer: Self-pay | Admitting: Internal Medicine

## 2012-05-26 ENCOUNTER — Ambulatory Visit: Admitting: Internal Medicine

## 2012-05-26 VITALS — BP 128/80 | HR 80 | Temp 99.1°F | Wt 248.6 lb

## 2012-05-26 MED ORDER — NITROGLYCERIN 0.4 MG SUBLINGUAL TABLET
0.4000 mg | SUBLINGUAL_TABLET | SUBLINGUAL | Status: AC | PRN
Start: 2012-05-26 — End: 2013-05-26

## 2012-05-26 NOTE — Telephone Encounter (Signed)
Referral coordinator please advise patient when authorization obtained regarding his myocardial perfusion test. Please expedite since we were hoping he could have this done before upcoming appointment with Cardiology   Thanks  Chrystopher Stangl M.D.

## 2012-05-26 NOTE — Patient Instructions (Signed)
An electronic referral has been submitted for an ECHOCARDIOGRAM.  An electronic referral has been submitted to [facility] for an Echocardiogram. The test is done at the [facility], [address], [place]. You may call [phone] to schedule appointment     An electronic referral has been submitted to RADIOLOGY for dobutamine nuclear medicine stress test for your heart. You should call radiology at [phone] to schedule your appointment.

## 2012-05-26 NOTE — Telephone Encounter (Signed)
From: [name]  To: Aurelio Jew, MD  Sent: 05/26/2012 11:03 AM PDT  Subject: Non-urgent Medical Advice Question    Hi Dr. Kirtland Bouchard,  3 issues...a.) I forgot to wait for the EKG results :)   b.) Radiology has the order but needs an authorization    c.)Echo has not been located there for 3 years...they are now in the main (new)   wing--1P [address] (first floor pavilion room [address])...they asked me to let you know.  Thanks,   [name]

## 2012-05-26 NOTE — Progress Notes (Signed)
[name] is a 61yr old male presents for followup.  Since last visit has had worsening symptoms of shortness of breath and wheezing.  Stopped exercising on his exercise bike due to symptoms.  Recent evaluation with pulmonary as well as allergy.  Spirometry testing noted for mild decreased lung volumes suggestive of mild airflow obstruction.  Allergy symptoms have stabilized.  Continues on nasal steroids.  No symptoms of wheezing or shortness of breath at rest but with fairly minimal exertion has either shortness of breath or wheezing or both as well as associated chest tightness.  Denies any nocturnal symptoms.  Denies any significant increased acid reflux symptoms.  Stopped fish oil recently due to recent news regarding adverse effects.  Has unfortunately gained weight since less active.  Also reports less compliance with dietary adherence over the summer.  Continues to drink, unchanged.  Concerned about possible cardiac etiology for his symptoms.    Patient Active Problem List   Diagnosis    ESOPHAGEAL REFLUX    Barrett's esophagus    Benign neoplasm of colon    FAMILY HX GI MALIGNANCY    GOUT UNSPECIFIED    BENIGN ESSENTIAL HYPERTENSION    OTHER AND UNSPECIFIED HYPERLIPIDEMIA    Gout    INFLAM SPONDYLOPATHY NOS    Angina    CAD (coronary artery disease)    Anxiety    S/P CABG x 6    A-fib    Premature ventricular contractions (PVCs) (VPCs)    Adjustment disorder with depressed mood    Alcohol dependence, continuous    Tremor    Chronic gastritis    LOC PRIM OSTEOART-PELVIS    Hip osteoarthritis    Diabetes type 2, controlled    Allergic rhinitis due to pollen    Acute atopic conjunctivitis    EXTRINSIC ASTHMA UNSPECIFIED      History   Substance Use Topics    Smoking status: Former Smoker -- 2.00 packs/day for 10 years     Types: Cigarettes    Smokeless tobacco: Never Used    Comment: quit at age 54    Alcohol Use: Yes      chardonnay mostly; 1-2 bottles of wine per day on  average       OBJECTIVE:  BP 128/80  Pulse 80  Temp(Src) 37.3 C (99.1 F) (Oral)  Wt 112.764 kg (248 lb 9.6 oz)  BMI 35.67 kg/m2  He appears well, in no apparent respiratory distress at rest.  Alert and oriented, pleasant and cooperative.  Eye exam is normal; sclera nonicteric, conjuctiva not injected.  Oropharynx is clear without lesions or exudate.   Neck is supple and free of adenopathy or masses,  Chest is clear to auscultation bilaterally. No wheezing, crackles or rhonchi. Normal symmetric air entry throughout both lung fields. No chest wall tenderness.  Heart exam shows regular rate and rhythm. No murmurs, rubs or gallops. No pitting edema or JVD.  Abdomen is obese, soft nontender.     Recent notes from both Dr. Burt Knack and Dr Eusebio Me reviewed.  Recent labs reviewed with patient.  Persistent elevated glucose/hemoglobin A1c consistent with mild diabetes, significant increased triglyceride/lipids compared to previous labs.    EKG today notable for PVCs, otherwise sinus rhythm, possible left atrial enlargement, unchanged compared to previous EKG 06/16/11      Impression/Plan:      (414.00) CAD (coronary artery disease)  (primary encounter diagnosis)  Comment: Extensive past cardiac history including previous history of stenting/CABG  Although most recent symptoms  appear to be primarily pulmonary/airway related, has not had any stress test evaluation recently.  Recommend cardiac imaging with echo to exclude significant right ventricular abnormality/pulmonary hypertension as well as stress testing to assess for possible ischemia  Patient is already scheduled to followup with cardiology  Plan: POC ELECTROCARDIOGRAM WITH RHYTHM STRIP,         ECHOCARDIOGRAM COMPLETE, NM MYOCARDIAL         PERFUSION IMAGING, SPECT, MULTIPLE        Followup sooner if needed    (493.00) EXTRINSIC ASTHMA UNSPECIFIED  Comment: Presumed diagnosis  Plan: ECHOCARDIOGRAM COMPLETE        Continue Advair, followup with pulmonary, complete  PFTs as scheduled  Discussed use of albuterol inhaler prior to exercise, gradually trying to resume low intensity exercise and see if able to tolerate since appears to have had some symptomatic improvement since starting Advair    (786.09) Dyspnea on exertion  Comment: see above   Plan: ECHOCARDIOGRAM COMPLETE, NM MYOCARDIAL         PERFUSION IMAGING, SPECT, MULTIPLE              Barriers to Learning assessed: none. Patient verbalizes understanding of teaching and instructions.    Electronically Signed By:     Woodward Ku M.D.  Attending Physician  [facility]

## 2012-05-26 NOTE — Nursing Note (Signed)
>>   Madelon Lips, LVN     Fri May 26, 2012  8:06 AM  Vital signs taken, allergies verified, screened for pain, med hx taken (if appropriate),   and verified immunization status.  Madelon Lips, LVN,

## 2012-05-29 ENCOUNTER — Ambulatory Visit: Admit: 2012-05-29 | Discharge: 2012-05-29

## 2012-05-29 ENCOUNTER — Encounter: Payer: Self-pay | Admitting: Internal Medicine

## 2012-05-29 NOTE — Telephone Encounter (Signed)
From: [name]  To: Aurelio Jew, MD  Sent: 05/26/2012 6:12 PM PDT  Subject: Non-urgent Medical Advice Question    Well, just to give all a head's up....2 minutes ago I received a call from [place] reminding me that I had an appointment with Dr. Kirtland Bouchard this morning at 8:00 a.a.???  Steffanie Dunn received one reminding her of an appointment with a dermatologist that she never has heard of.....Marland Kitchenserious problems here.....  [name]

## 2012-06-01 ENCOUNTER — Encounter: Payer: Self-pay | Admitting: Rheumatology

## 2012-06-01 ENCOUNTER — Ambulatory Visit: Admitting: Rheumatology

## 2012-06-01 VITALS — BP 135/88 | HR 94 | Temp 97.4°F | Wt 246.7 lb

## 2012-06-01 DIAGNOSIS — H1010 Acute atopic conjunctivitis, unspecified eye: Secondary | ICD-10-CM

## 2012-06-01 DIAGNOSIS — J301 Allergic rhinitis due to pollen: Secondary | ICD-10-CM

## 2012-06-01 DIAGNOSIS — J45909 Unspecified asthma, uncomplicated: Secondary | ICD-10-CM

## 2012-06-01 NOTE — Progress Notes (Signed)
The patient is seen in followup for his allergic rhinoconjunctivitis and asthma.  The patient has been stable since he was last seen, with his MDI switched from Flovent to Advair 500/50 by Dr. Burt Knack, pulmonologist, after a PFT.  The patient's nocturnal symptoms improved with the treatment of his GERD with a bed wedge and addition of Zantac to the omeprazole.  He continues on the fluticasone nasal spray and Advil cold/sinus for his nasal symptoms.    Examination: Per homunculus; notable for clear nares and clear chest.    Assessment/plan: Allergic rhinoconjunctivitis, asthma.  The patient is stable and will continue his Advair 500/50 per Dr. Burt Knack and fluticasone nasal spray, Advil cold/sinus, omeprazole twice a day and Zantac at bedtime.  He will return in 6 months as he has an appointment with Dr. Burt Knack for the diagnosis of emphysema.

## 2012-06-01 NOTE — Nursing Note (Signed)
>>   Joshua Flirt, MA     Thu Jun 01, 2012 10:47 AM  patient screened, vitals completed, pharmacy verified, pain assessed.  ERobinson MA1

## 2012-06-02 ENCOUNTER — Encounter: Payer: Self-pay | Admitting: Internal Medicine

## 2012-06-02 ENCOUNTER — Telehealth: Payer: Self-pay | Admitting: Internal Medicine

## 2012-06-02 NOTE — Telephone Encounter (Signed)
Received call from pt stating that he is a pt of Dr Burt Knack in [facility] and was recently dx with emphysema.  Dr Burt Knack will be out of clinic until November and he was recommended to call Jst office to see if he could be seen.  Please call to advise asap as pt is anxious about his dx.    Thank you  Isa Rankin.

## 2012-06-02 NOTE — Telephone Encounter (Signed)
That is not the protocol here. Forwarded to Penelope Coop, Facilities manager to f/u.  Christie Beckers, RN

## 2012-06-02 NOTE — Telephone Encounter (Signed)
Needs to talk to Dr on call for Dr Burt Knack. Please send back to [facility]. Christie Beckers, RN

## 2012-06-02 NOTE — Telephone Encounter (Signed)
From: [name]  To: Aurelio Jew, MD  Sent: 06/02/2012 8:57 AM PDT  Subject: Non-urgent Medical Advice Question    Vedia Pereyra, MD  Received:    05/30/2012 11:59 AM PDT    Yes, I could not forward the results to you but your tests showed a small amount of obstruction, meaning a small amount of emphysema. It is very mild but may explain why you feel better with the inhaler and had your symptoms.  Christian    Dr.K,  two minutes ago Alana calls and tells me Dr. Burt Knack [place] of [place] until 09-26-23....after getting my death notice from him via "MY CHART" I have been counting the seconds until our appointment on Wednesday. I cannot believe this has happened! INSANITY!!!!!!!!!

## 2012-06-02 NOTE — Telephone Encounter (Signed)
I called the [facility] to see who would be covering for Dr Burt Knack (as pt was very frustrated), I s/w Lennie Muckle and she said that they have no one to cover. Lennie Muckle also added that their protocol is to refer pt's to the [facility] office.    Isa Rankin

## 2012-06-02 NOTE — Telephone Encounter (Signed)
Called patient. He was very distraught when he saw the word emphysema. His father in law died of end stage emphysema and he immediately thought of this diagnosis as a "death sentence" and read on the internet that he has a life expectancy of 5 yrs and there is no treatment, etc. He was also more distraught by the fact that he was erroneously advised by staff that there is no one covering for Dr Burt Knack while he will be out of the office for about a month and he felt he cannot wait that long until able to talk to him about this.   Provided reassurance that his PFT results showed mild abnormal findings and that his diagnosis of emphysema is not indicative of the grim outlook and prognosis he has invisioned.  Advised will let Dr Burt Knack know of the unintended consequences of his mychart message and that he will be contacted either by my office with either an appointment with Dr Burt Knack or a phone call from    Dr Burt Knack himself.  Alenna Russell M.D.

## 2012-06-02 NOTE — Telephone Encounter (Signed)
From: [name]  To: Vedia Pereyra, MD  Sent: 06/02/2012 8:53 AM PDT  Subject: Non-urgent Medical Advice Question    Dr. Burt Knack,  I cannot believe this! You send me a 4 line note telling me I have emphysema.....so I am dying....and I cannot see you until November????????? I was counting the seconds left until our appointment.Marland KitchenMarland KitchenI am freaking out here!!!!!!!!!!!!!!!  Jinny Sanders

## 2012-06-06 ENCOUNTER — Ambulatory Visit

## 2012-06-06 ENCOUNTER — Ambulatory Visit: Admitting: Cardiovascular Disease

## 2012-06-06 ENCOUNTER — Ambulatory Visit: Admitting: Internal Medicine

## 2012-06-06 ENCOUNTER — Encounter: Payer: Self-pay | Admitting: Internal Medicine

## 2012-06-06 VITALS — BP 131/95 | HR 86 | Ht 70.0 in | Wt 249.0 lb

## 2012-06-06 VITALS — BP 136/92 | HR 90 | Temp 97.4°F | Resp 18 | Wt 249.0 lb

## 2012-06-06 MED ORDER — MONTELUKAST 10 MG TABLET
10.0000 mg | ORAL_TABLET | Freq: Every day | ORAL | Status: DC
Start: 2012-06-06 — End: 2012-12-15

## 2012-06-06 MED ORDER — FENOFIBRIC ACID (CHOLINE) 135 MG CAPSULE,DELAYED RELEASE
1.0000 | DELAYED_RELEASE_CAPSULE | Freq: Every day | ORAL | Status: AC
Start: 2012-06-06 — End: 2013-06-06

## 2012-06-06 NOTE — Nursing Note (Signed)
>>   Ralene Bathe, MA     Tue Jun 06, 2012  3:19 PM  Vitals signs taken,screened for pain,allergies verified, pharmacy verified, EKG done. Ralene Bathe, [place]     >> [facility]     Tue Jun 06, 2012  2:54 PM  Pt's med list updated with information provided by patient.Collier Salina LVN

## 2012-06-06 NOTE — Nursing Note (Signed)
>>   Ann Maki, MA     Tue Jun 06, 2012 10:29 AM  vitals taken, allergies reviewed, pain level recorded.  Diane Joya Gaskins MAII

## 2012-06-07 ENCOUNTER — Ambulatory Visit: Admitting: Internal Medicine

## 2012-06-08 NOTE — Progress Notes (Signed)
RE:  TRAIVON, Hensley  MR#:  [id number]  DOB:  12-05-1950  Date of Service:  06/06/2012      Joshua Jew, MD    Dear Dr. Felicita Hensley:    [name] returned to our Cardiology Clinic for followup  evaluation of his heart disease.    As you recall, he is a 61 year old gentleman from [place] who had a  long history of coronary artery disease, hypertension, and elevated  lipids. Beginning in 1995, the patient has had multiple percutaneous  interventions and, when he came to [place], he had a history of 25  stents implanted in his coronary arteries, with 15 of these stents in  his right coronary artery and 10 in the left system.  In 2011, the  patient had increasing symptoms of ischemia, and cardiac  catheterization demonstrated severe three-vessel coronary artery  disease.  The patient underwent six-vessel coronary artery bypass  graft surgery at [facility] with LIMA placed to the LAD, a  free right internal mammary artery graft to the diagonal branch, left  radial graft to the circumflex and vein grafts to the right coronary  artery and posterolateral branch.  The patient had made an excellent  recovery and has had no ischemic symptoms since that time.    More recently, he has complained of difficulty breathing, and he was  found to have wheezing secondary to obstructive airways disease.  He  has been treated with Advair with clinical improvement.  Unfortunately, he has not exercise and he has increased his weight by  20 pounds.  Additionally, recent lipid profile demonstrated a  triglyceride of 933.    The patient has had no chest pain of angina pectoris or myocardial  infarction.  He has dyspnea on exertion but no orthopnea or paroxysmal  nocturnal dyspnea.  He is unaware of palpitations, dizziness, or  syncope.  He is anxious to have left hip surgery, and he admits to  being very noncompliant with a cardiac diet.  He specifically admits  to drinking 1-1/2 to 2 bottles of wine daily.    His  present medical regimen includes vitamin B12 1000 mcg daily,  Singulair 10 mg daily, atenolol 100 mg b.i.d., Advair Diskus 500/50  one puff b.i.d., Ativan 0.5 mg t.i.d. p.r.n. and h.s., Albuterol 1-2  puffs every 4-6 hours as needed, sildenafil 100 mg p.r.n., allopurinol  300 mg daily, lisinopril, hydrochlorothiazide 20/25 daily, Protonix 40  mg daily, Flonase as needed, vitamin D3 five tablets daily.    Physical exam:  Pleasant, overweight male in no acute distress.  Blood  pressure 126/91, pulse 86, respirations 16, weight 249 pounds.  Chest:  Slight expiratory slowing with forced expiration.  No wheezes.  Cardiovascular:  Jugular venous pressure 8 cm of water.  Carotid  pulses normal without bruits.  PMI is not appreciated by inspection or  palpation.  Heart sounds distant, S4 gallop at the apex.  Extremity:  Trace edema.    Electrocardiogram:  Normal sinus rhythm, left atrial enlargement.    Potassium 4.1, BUN 16, creatinine 0.91, glucose 133, cholesterol 264,  triglycerides 933, LDL 90, HDL 39.  Hemoglobin A1c 5.9.    Assessment:  1) Coronary artery disease:  Status post CABG x6, 11/1999, doing well.  No symptoms of ischemia.  Nuclear myocardial perfusion scan is  scheduled by you because of his shortness of breath.  I am hopeful  that this will reveal no evidence of pharmacologic stress-induced  ischemia  2) Multiple  coronary interventions with 25 stents implanted in [place] from 1995 to 2010  3) Hyperlipidemia:  Poor lipid profile.  Must improve dietary  compliance.  Will add Trilipix in hopes of improving his  triglycerides. He is encouraged to reduce his alcohol intake.  He  denies a sweet tooth and does not eat a lot of carbohydrates  4) Gout:  On allopurinol. No symptoms  5) HLA-B27 disease  6) Hip pain from osteoarthritis:  If his Nuclear myocardial perfusion  scan shows no significant ischemia, I see no reason that he cannot  proceed with hip surgery  7) Hypertension:  Improved salt restriction.   Will reassess blood  pressure in one month.  He will call me with results  8) Tremor:  Improved with atenolol, but this may be contributing to  his asthma.  Reduce his atenolol to 100 mg in the morning and 50 in  the evening  9) Obesity:  Severe.  Encourage weight reduction program.  He is  inclined to use a protein supplement twice daily.    Plan:  1) Await results of pharmacologic stress myocardial perfusion scan  2) Reduce atenolol to 100 mg in the morning, 50 mg in the evening  3) Trilipix 135 mg daily.    I appreciate this opportunity to continue in the care of [name].  His echo shows well-preserved cardiac function, and there was  inadequate tricuspid regurgitation jet to assess pulmonary artery  pressures.  Please contact me if you have any questions regarding his  visit today.    Sincerely,        Report Electronically Signed - 06/30/2012 12:10:45 by  Joshua Monks, MD  Professor And Chief  Internal Medicine  Cardiovascular Medicine    RIL/co  D:  06/07/2012 22:48:53 PDT/PST  T:  06/08/2012 11:54:35 PDT/PST  Job #:  [id number] / [id number]

## 2012-06-09 ENCOUNTER — Ambulatory Visit

## 2012-06-12 ENCOUNTER — Ambulatory Visit: Admit: 2012-06-12 | Discharge: 2012-06-12

## 2012-06-12 MED ORDER — AMINOPHYLLINE 75 MG/50 ML D5W IVBAG
75.0000 mg | Status: DC | PRN
Start: 2012-06-12 — End: 2012-06-14

## 2012-06-12 MED ORDER — REGADENOSON 0.4 MG/5 ML INTRAVENOUS SYRINGE
0.4000 mg | INJECTION | Freq: Once | INTRAVENOUS | Status: AC
Start: 2012-06-12 — End: 2012-06-12
  Administered 2012-06-12: 0.4 mg via INTRAVENOUS
  Filled 2012-06-12: qty 5

## 2012-06-13 ENCOUNTER — Ambulatory Visit: Admit: 2012-06-13 | Discharge: 2012-06-13

## 2012-06-14 ENCOUNTER — Encounter: Payer: Self-pay | Admitting: Cardiovascular Disease

## 2012-06-14 ENCOUNTER — Encounter: Payer: Self-pay | Admitting: Internal Medicine

## 2012-06-14 NOTE — Telephone Encounter (Signed)
Dr. Low called patient discussed results.

## 2012-06-14 NOTE — Telephone Encounter (Signed)
From: [name]  To: Su Monks, MD  Sent: 06/14/2012 9:59 AM PDT  Subject: Non-urgent Medical Advice Question    Hi Dr. Rachell Cipro,  I am anxious to hear how I did on my stress test over the last two days. Have you had a chance to review the results yet? You had put a note in your 'hand held' device to look at them yesterday. Many thanks.  [name] [employer]

## 2012-06-14 NOTE — Telephone Encounter (Signed)
From: [name]  To: Aurelio Jew, MD  Sent: 06/14/2012 11:38 AM PDT  Subject: Non-urgent Medical Advice Question    Hi Dr. Kirtland Bouchard,  I just spoke to Dr. Rachell Cipro.....my stress test results were fine! I can get back to exercise and move forward with hip surgery.Marland KitchenMarland KitchenMarland KitchenMarland Kitchenhe set up a blood lab for me for the beginning of October. Thanks for all!  [name] [employer]

## 2012-06-19 ENCOUNTER — Encounter: Payer: Self-pay | Admitting: Internal Medicine

## 2012-06-19 NOTE — Telephone Encounter (Signed)
From: [name]  To: Aurelio Jew, MD  Sent: 06/19/2012 12:39 PM PDT  Subject: Non-urgent Medical Advice Question    Hi Doctor K,  I have three more issues to deal with going forward. Endoscopy/Colonoscopy.....can I just call and schedule or do you need to do it? Hip replacement....is it safe to assume I have your blessing and go forward? I guess I just call orthopedics.Marland KitchenMarland KitchenI know I have to attend a class first but just checking on procedure. Many thanks,  [name] [employer]

## 2012-06-19 NOTE — Telephone Encounter (Signed)
From: [name]  To: Vedia Pereyra, MD  Sent: 06/19/2012 12:36 PM PDT  Subject: Non-urgent Medical Advice Question    Hi Dr. Burt Knack,  I am not sure how you are handling messages but I just wanted you to know that I have cut my Advair 500/50 to once/day...this will be day four at 1/2 dose.Marland KitchenMarland KitchenMarland KitchenI have not been able to sleep with a second dose in the evening...simply put....wired.  I only have two puffs left and wonder if i should renew or am ibetter off with 250/25 twice/day? It works and I feel fine BTW.  Regards,  [name]

## 2012-06-21 ENCOUNTER — Encounter: Payer: Self-pay | Admitting: Internal Medicine

## 2012-06-21 MED ORDER — FLUTICASONE-SALMETEROL 250 MCG-50 MCG/DOSE DISK POWDER FOR ORAL INHALATION
1.0000 | DISK | Freq: Two times a day (BID) | RESPIRATORY_TRACT | Status: AC
Start: 2012-06-21 — End: 2013-06-21

## 2012-06-30 ENCOUNTER — Other Ambulatory Visit: Payer: Self-pay | Admitting: ORTHOPAEDIC SURGERY

## 2012-07-04 ENCOUNTER — Ambulatory Visit

## 2012-07-07 ENCOUNTER — Ambulatory Visit

## 2012-07-09 NOTE — Progress Notes (Signed)
[facility] PULMONARY/INFECTIOUS DISEASES CONSULTATION        HISTORY OF PRESENT ILLNESS  [name] is a 61yr old male who is referred to the  pulmonary clinic by Aurelio Jew, MD for evaluation of cough. Patient's symptoms have been ongoing for 2 years. The severity is described as moderate and the symptoms are better with daytimenand afternoon and made worse with am.      Was diagnosed with asthma by [place] recently and had extensive allergy testing that showed multiple extrinsic triggers.  He gets a ton of wheezing at night rather than day and notes worse with heat and wind.  Then has some improvement in afternoon.  Symptioms have chest tightness, cough, clear mucous for hours.  Uses albuterol with great resolution.  Waas givne advair but only took for 2 days.  Then used flovent 11 2 puffs BID.  In addition uses afrin and claritin.  No F/C/NS or other triggers known.   Triggers:   Significant Ocupational Exposures:none  Hobbies and Pets:none    I did ROS:A full comprehensive review of systems was performed and was entirely negative except for the pertinent positive noted above.  I did review the medical/surgical and social history listed below   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     Acute myocardial infarction of other inferior wall, episode of care unspecified 1996    Appendicitis, acute 07/02/2009    Angina 10/31/2009    Shortness of breath     Coronary artery disease     Arthritis     Psychiatric illness     Diabetes type 2, controlled 03/17/2012    EXTRINSIC ASTHMA UNSPECIFIED 05/11/2012     Past Surgical History   Procedure Laterality Date    Insert  intracoronary stent       Stent placement x 25 by report    Egd  2006    Colonoscopy  3/08    Removal of tonsils,<11 y/o       Tonsillectomy-childhood    Appendectomy  2010    Colonoscopy,diagnostic  08/10/2010     Colonoscopy    Upper gi endoscopy,diagnosis  06/17/2011     EGD    Upper gi endoscopy,biopsy  06/17/2011           Family History   Problem Relation Age of Onset    Hypertension Brother     Cancer Mother      colon, 12's     History     Social History    Marital Status: MARRIED     Spouse Name: N/A     Number of Children: N/A    Years of Education: N/A     Social History Main Topics    Smoking status: Former Smoker -- 2.00 packs/day for 10 years     Types: Cigarettes    Smokeless tobacco: Never Used    Comment: quit at age 61    Alcohol Use: Yes      chardonnay mostly; 1-2 bottles of wine per day on average     Drug Use: No  Sexually Active: Yes -- Male partner(s)     Other Topics Concern    Not on file     Social History Narrative    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  8.5 g  5    Allopurinol (ZYLOPRIM) 300 mg Tablet Take 1 tablet by mouth every day.  90 tablet  3    CENTRUM SILVER TAB take 1 tablet by oral route once daily        Cholecalciferol, Vitamin D3, (VITAMIN D) 1,000 unit Tablet Take 5 tablets by mouth every day.  100 tablet  3    CO-ENZYME Q-10 100 mg cap Take 200 mg by mouth every day.        Ezetimibe 10 mg/Simvastatin 40 mg (VYTORIN) 10-40 mg Tablet Take 1 tablet by mouth every evening.  90 tablet  3    Fluticasone (FLONASE) 50 mcg/actuation nasal spray Instill 2 sprays into EACH nostril every day. As needed.  1 bottle  12     Lisinopril-Hydrochlorothiazide (PRINZIDE, ZESTORETIC) 20-25 mg per tablet Take 1 tablet by mouth every morning.  90 tablet  3    Lorazepam (ATIVAN) 0.5 mg Tablet Take 1 tablet by mouth 3 times daily. and 2 tabs at bedtime  150 tablet  5    Pantoprazole (PROTONIX) 40 mg Delayed Release Tablet Take 1 tablet by mouth 2 times daily.  180 tablet  3    PATADAY 0.2 % Drops INSTILL 1 DROP INTO EACH EYE EVERY DAY.  2.5 mL  5    Sildenafil (VIAGRA) 100 mg Tablet Take 1 tablet by mouth if needed. Prior to sexual activity  4 tablet  5    VITAMIN B-12 1,000 MCG TAB once daily        VITAMIN C PO 400 mg daily         No current facility-administered medications on file prior to visit.     @REVNEGPULM @    PHYSICAL EXAM  BP 153/92  Pulse 81  Temp(Src) 36.2 C (97.2 F) (Tympanic)  Wt 112.9 kg (248 lb 14.4 oz)  BMI 35.71 kg/m2  WJX:BJYNWGN, alert, no distress, pleasant affect, cooperative.  Eyes:  conjunctivae and corneas clear. PERRL, EOM's intact. sclerae normal.  Ears:  normal TMs and canal and external inspection of ears show no abnormality.  Nose:  normal.  Mouth: normal.  Neck:  Neck supple. No adenopathy, thyroid symmetric, normal size.  Heart:  normal rate and regular rhythm, no murmurs, clicks, or gallops.  Lungs: clear to auscultation.  Abdomen: BS normal.  Abdomen soft, non-tender.  No masses or organomegaly.  Extremities:  no cyanosis, clubbing, or edema.    I personally reviewed the following studies and reviewed my findings with the patient and family:     CT scan CHEST: none  CXR: normal  FAO:ZHYQ        IMPRESSION    The patient is a 61yr old male with primary complaint of  Cough, wheeze and mucous production that is most consistent with moderate persistant asthma.  This is based on extrinsic  triggers mostly   PLAN  1. Advair 500/50 rather than flovent  2. Claritin and flonase  3. PFt    I spent 38 min with the patient in face to face discussions reviewing clinical and diagnostic test findings and  developing an action care plan, with over 50% of that time in counseling and coordination of care.     Electronically signed by C. Burt Knack, MD, MPH

## 2012-07-09 NOTE — Progress Notes (Signed)
[facility] PULMONARY/ID FOLLOWUP APPOINTMENT    HISTORY OF PRESENT ILLNESS  [name] is a 61yr old male who is a patient of Anca Rolland Porter, MD and is here for follow up of wheezing. Patient's symptoms have been ongoing for 6 months. The severity is described as moderate and the symptoms are improved with medication and made worse with wind and heat.    Advair improved symptoms greatly.  Feels much better.  No issues except PND for now.     Additional triggers for the patients symptoms:   Orthopnea or PND: NO  Seasonal: NO  Allergy: NO    ROS  I  did review unrelated symptoms. ROS:A full comprehensive review of systems was performed and was entirely negative except for the pertinent positive noted above.  @REVNEGPULM @    PHYSICAL EXAM  BP 136/92  Pulse 90  Temp(Src) 36.3 C (97.4 F) (Tympanic)  Resp 18  Wt 112.946 kg (249 lb)  BMI 35.73 kg/m2  SpO2 98%  ZOX:WRUEAVW, alert, no distress, pleasant affect, cooperative.  Eyes:  conjunctivae and corneas clear. PERRL, EOM's intact. sclerae normal.  Ears:  normal TMs and canal and external inspection of ears show no abnormality.  Nose:  normal.  Mouth: normal.  Neck:  Neck supple. No adenopathy, thyroid symmetric, normal size.  Heart:  normal rate and regular rhythm, no murmurs, clicks, or gallops.  Lungs: clear to auscultation.  Abdomen: BS normal.  Abdomen soft, non-tender.  No masses or organomegaly.  Extremities:  no cyanosis, clubbing, or edema.      I personally reviewed the following studies and reviewed my findings with the patient and family:     CT scan CHEST: none  CXR: normal  UJW:JXBJ Pulmonary (PFT) results as follows:  FEV1 (POST)   Date Value Range Status   05/29/2012 3.35  0 - 12 Liters Final-Edited        FEV1 (PRE)   Date Value Range Status   05/29/2012 3.30  0 - 12 Liters Final-Edited        FEV1/FVC (POST)   Date Value Range Status   05/29/2012 68  0 - 12 % Final-Edited        FEV1/FVC (PRE)   Date Value Range Status   05/29/2012 69  0 - 12  % Final-Edited        FVC (POST)   Date Value Range Status   05/29/2012 4.91  0 - 12 Liters Final-Edited        FVC (PRE)   Date Value Range Status   05/29/2012 4.79  0 - 12 Liters Final-Edited        TLC (PRE)   Date Value Range Status   05/29/2012 7.32  0.05-11.99 Liters Final-Edited        DLCO (POST)   Date Value Range Status   05/29/2012 30.3  0.05-99.99 mL/mmHg/min Final-Edited           IMPRESSION    The patient is a 61yr old male with primary complaint of cough and moderate persistant asthma that is now under good control with advair 500/50 and allergic rhinit control.  PLAN  1. Continue adviar with plan to decrease as needed once under control  2. Claritin and flonase  3. Add singulair    I spent 22 min with the patient in face to face discussions reviewing clinical and diagnostic test findings and developing an action care plan, with over 50% of that time in counseling and coordination of care.  Electronically signed by C. Volney Presser, MD, MPH

## 2012-07-13 ENCOUNTER — Encounter: Payer: Self-pay | Admitting: Internal Medicine

## 2012-07-13 ENCOUNTER — Ambulatory Visit

## 2012-07-13 MED ORDER — EZETIMIBE 10 MG-SIMVASTATIN 80 MG TABLET
1.0000 | ORAL_TABLET | Freq: Every day | ORAL | Status: AC
Start: 2012-07-13 — End: 2013-07-13

## 2012-07-13 NOTE — Telephone Encounter (Signed)
From: [name]  To: Aurelio Jew, MD  Sent: 07/12/2012 1:49 PM PDT  Subject: Non-urgent Medical Advice Question    Hi Dr. Kirtland Bouchard,  Can I please get a refill for Vytorin Tab 10/40 mg # 90      this is through [facility] delivery....thank you!  [name] [employer]

## 2012-07-13 NOTE — Telephone Encounter (Signed)
Left message on voicemail, advised to return call.    Note: please obtain additional [employer] pharmacy information, therefore prescription can be e-faxed to appropriate pharmacy since three different ones are listed.    Elease Hashimoto M.A.II

## 2012-07-19 ENCOUNTER — Ambulatory Visit

## 2012-07-19 NOTE — Nursing Note (Signed)
>>   Scheryl Darter     Wed Jul 19, 2012  2:08 PM  The Influenza Vaccine VIS document for the flu injection was given to patient to review. Patient or person named in permission has answered no to any history of egg allergy, previous serious reaction to a influenza vaccine or current illness which would preclude them receiving an immunization. Any questions were referred to the physician. The Influenza Vaccine was then administered per protocol. The patient was observed for immediate reactions to the vaccine per protocol. None were observed.    Scheryl Darter MA II

## 2012-08-07 ENCOUNTER — Ambulatory Visit: Admitting: Rehabilitative and Restorative Service Providers"

## 2012-08-07 ENCOUNTER — Ambulatory Visit

## 2012-08-08 NOTE — Progress Notes (Signed)
Occupational Therapy Total Joint Pre-Operative Class:   14:00 to 15:00   Patient attended the pre-operative total joint class for instruction/information including:   Home Set-Up   Sleeping arrangements   Type of bed   Steps to enter the house   Seating arrangements   Bathroom set-up   Ride Home   Assistance At Home   What to expect after surgery regarding therapy.   Goals regarding therapy   Instruction/demonstration of exercises   Demonstration of Durable Medical Equipment   Review of precautions   Questions were answered.     Bri Wakeman , MOT,OTR/L  PI # [id number]

## 2012-08-17 ENCOUNTER — Other Ambulatory Visit: Payer: Self-pay | Admitting: ORTHOPAEDIC SURGERY

## 2012-08-17 DIAGNOSIS — Z01818 Encounter for other preprocedural examination: Secondary | ICD-10-CM

## 2012-08-17 DIAGNOSIS — Z7901 Long term (current) use of anticoagulants: Secondary | ICD-10-CM

## 2012-08-18 ENCOUNTER — Ambulatory Visit

## 2012-08-18 ENCOUNTER — Telehealth: Payer: Self-pay | Admitting: GASTROENTEROLOGY

## 2012-08-18 NOTE — Telephone Encounter (Signed)
Unable to reach patient. Message left thru voicemail to confirm his upcoming procedure on 08/24/2012 @1pm  and to call back if he has any question.

## 2012-08-22 ENCOUNTER — Ambulatory Visit: Admitting: ORTHOPAEDIC SURGERY

## 2012-08-22 ENCOUNTER — Ambulatory Visit

## 2012-08-22 VITALS — BP 157/93 | HR 82 | Temp 97.2°F | Ht 70.0 in | Wt 250.0 lb

## 2012-08-22 MED ORDER — ENOXAPARIN 40 MG/0.4 ML SUBCUTANEOUS SYRINGE
40.0000 mg | INJECTION | SUBCUTANEOUS | Status: AC
Start: 2012-08-22 — End: 2012-09-05

## 2012-08-22 NOTE — Patient Instructions (Signed)
Instructions for Day of Surgery    1) Bring your [place] blue card or some form of ID.  2) Do not smoke or drink alcohol.  3) DO NOT bring valuables or jewelry with you on the day of surgery.  4) Take a bath or shower on the morning of surgery.    5) DO NOT apply cosmetics, creams or lotion after bathing.  6) Wear comfortable clothing that either zips or buttons in front.  7) Arrange for transportation to bring you home after your surgery and someone to stay with at home.  Your surgery will be cancelled if you do not have adequate transportation.  8) Bring your CPAP machine for sleep apnea if applicable.    Fasting Guidelines for Adults    1) NO solid food, dairy products, chewing gum after midnight.  2) On the Morning of Surgery:  Clear liquids may be continued until 2 hours prior to the time of arrival.  Clear liquids include water, apple juice, carbonated beverages, black coffee or tea without cream or milk.  (No Jello or broth after midnight) No alcohol containing beverages.  3) Take only these medications with a sip of water on the morning of surgery:     Atenolol, singulair, and protonix. Usual morning inhlaers and bring albuterol inhaler with you day of surgery.       Contacts:  You will receive a phone call between 2-5 p.m.on the work-day before your surgery date to confirm the time of check-in and location for your surgery.    For questions, please call;    [phone] for [facility],   [phone] or [phone] for [facility].      Location for Check-In on Day of Surgery    [facility] Surgery at [address] [place], [place] [address]   Main Lobby Admissions Office - [facility] (Room 1P210)    [facility] - [address] [place], [place] [address]

## 2012-08-22 NOTE — Patient Instructions (Signed)
PREOPERATIVE INSTRUCTIONS AND INFORMATION FOR ADULTS    INSTRUCTIONS FOR 1 WEEK PRIOR TO SURGERY:    No leg shaving (specifically the operative leg) x 1 week.    Full body Hibiclens shower each of five days before surgery (purchase Hibiclens @ any drugstore)    Sleep in clean sheets the night before surgery.    Keep fingernails/toenails free of polish (even clear).      INSTRUCTIONS FOR DAY OF SURGERY:     1. DO arrange for someone else to transport you home after your hospital stay.    2. DO NOT smoke, chew tobacco or drink alcohol.    3. DO NOT apply cosmetics, creams or lotion after showering/bathing.    4. DO NOT bring valuables or jewelry (including your wedding ring) with you on the day of surgery/procedure.    5. DO wear comfortable clothing.    6. DO bring some form of ID with you.    7. DO bring your CPAP machine for sleep apnea if you use one.        SURGERY ARRIVAL TIME:      You will receive a phone call between 12 noon-6 p.m. from the operating room (OR) on the work-day before your surgery date to confirm the time of check-in and location for your surgery/procedure.    If, for whatever reason, you do not receive this call, you may contact the operating room at                      [phone]          For general questions or concerns both before and after surgery please call your nurse case manager Cruz Condon RN  @ [phone]          FASTING (NPO) GUIDELINES FOR ADULTS    1. NO solid food, dairy products, chewing gum, juice with pulp (e.g. [place] Juice) after midnight.      On the Morning of Surgery: You may drink clear liquids until 2 hours prior to the time of arrival.      Clear liquids include water, clear fruit juice (no pulp), carbonated beverages, and   black coffee or tea without cream or milk.  Do not drink any alcohol containing   beverages.        2. Take only these medications with a sip of water on the morning of surgery:    Defer to [facility] (anesthesis)    3.  Please STOP these  medications 7 days prior to surgery:         Aspirin w/ codeine, CoQ10 & indomethacin    You will be started on the blood thinner Lovenox (enoxaparin) after surgery to help prevent blood clots.  A prescription has been sent to [facility].  If you pick it up before surgery please do NOT start taking it until you return home after your hospitalization.    CHECK-IN LOCATIONS FOR DAY OF SURGERY     [facility] at [address], [place], [place] [address]     Main hospital Admission office (Room 1P210)- [facility] Lobby      Park in [address] Cost is $2.00 per day when validated.    You have indicated you plan to return home after your hospitalization with assistance from your wife.  You may receive a call prior to surgery from a home health agency who will be sending a nurse and  physical therapist to your home after you get home.    Preoperative instructions reviewed with patient on 08/22/2012 - Minerva Ends, RN

## 2012-08-22 NOTE — Nursing Note (Signed)
>>   Minerva Ends, RN     Tue Aug 22, 2012 10:28 AM  Reviewed complex pre-op instructions with patient.    >> Talmage Coin, [place]     Tue Aug 22, 2012  9:18 AM  MRSA culture done by Linward Natal MA/taken to lab by Tegan MA.

## 2012-08-22 NOTE — H&P (Signed)
[name], [name]  MR #:  [id number]  DOB:  08/18/1951  SEX:  M  AGE:  61  ADMISSION DATE:  09/05/2012  INPATIENT TBA HISTORY AND PHYSICAL    CHIEF COMPLAINT:    Debilitating left hip pain.    PROPOSED PROCEDURE:    Left total hip arthroplasty.    DATE OF SURGERY:    09/05/2012.    SURGICAL LOCATION:    [facility].    SURGICAL ATTENDING:    Rip Harbour, MD    Additional risk factors for bleeding.    HPI:    This is a 61 year old Caucasian male, 5'10", 250, BMI 36, significant  history of CAD, status post CABG 2008, preceding with 25 stents in  February 2008, drug-eluting in nature.  The patient currently is on  enteric-coated aspirin only, no Plavix and/or Coumadin.    He has developed debilitating left hip pain, which has been  recalcitrant to continued nonoperative procedures and elected to  undergo total hip arthroplasty in the left.  He has appropriate  clearances primarily from his primary care physician, as well as from  his cardiologist.  In addition, we are pending the final dental  clearances.    CURRENT MEDICATIONS:    Current medications are listed as vitamin B12; simvastatin;  fluconazole; Fenofibric acid; Singulair; Tenormin; Nitrostat p.r.n.,  last use greater than a year ago; Ativan; ProAir; Pataday eye drops;  Viagra p.r.n,  lisinopril; hydrochlorothiazide;Protonix; Flonase;  cholecalciferol vitamin D3; coenzyme; Silver Centrum; and vitamin C  p.o.    ALLERGIES:    RELATED TO KEFLEX, PRODUCING RASH.    SURGERIES:    Listed above, including appendectomy, tonsillectomy, status post  radial artery harvesting for bypass grafting, saphenous veins for  bypass grafting, internal mammaries for bypass grafting.    HOSPITALIZATIONS:    The same.    PAST MEDICAL HISTORY:    Related to tremor, PVCs, inflammatory spondyloarthropathy, chronic  gastritis, esophageal reflux, extrinsic asthma, Barrett's esophagitis,  anxiety, alcohol dependence, adjustment disorder, depression, atrial  fib.  The patient is a  nonsmoker.  Drinks 1 bottle of wine to 1-1/2  bottles of wine per day.  No history of marijuana, cocaine, heroin,  PCP, amphetamines, barbiturates.    REVIEW OF SYSTEMS:    Pertinent for the current musculoskeletal issues of debilitating left  groin pain; currently denies any shortness of breath and/or chest  pain; no GI or GU complaints.    FAMILY HISTORY:    Not provided.    PHYSICAL EXAMINATION:    VITAL SIGNS:  Temp 97.2, blood pressure is 157/93, pulse 82 and  regular, 250 pounds, 70 inches height, 35.9 BMI.  Pain scale 5 of 0 to  10.  Alert and oriented x3.  No acute distress.    HEENT:  Within normal limits.  Midline trachea.    NECK:  Supple.    CARDIOVASCULAR:  Shows S1, S2.  Normal sinus rhythm, no murmurs.    LUNGS:  Clear to auscultation.    ABDOMEN:  Abdomen is grossly obese.  I cannot appreciate any  hepatosplenomegaly.    LYMPHATIC:  Lymphatic examination is negative.    NEUROLOGIC:  Neurologically, cranial nerves 2-12 are grossly intact.    SKIN:  Skin, overall, is warm, dry, and intact.    BREASTS, GENITALIA, RECTAL:  Not done, not applicable to surgical  procedure.    EXTREMITY:  left hip:  marked hip limitation with hip flexion to  approximately 90 degrees, full extension in supine  position, internal  rotation to 0, external rotation 10, adduction to neutral, abduction  to 20 degrees.  Pain reproduced with internal rotation, hip flexion,  and internal rotation.  Leg lengths are grossly equal.  Muscle  strength testing of 5/5.  Pulses distally, the DP, PT are 2+.    IMAGING:    Imaging studies obtained today show marked severe end-stage DJD of the  left hip, moderate end-stage DJD of the right hip is noted.  Soft  tissues unremarkable.    IMPRESSION:    61 year old male with marked DJD of the left hip, on the operative  schedule for elective left total hip arthroplasty.  Multiple medical  problems, as listed above.    PLAN:    The patient wishes to proceed with total hip arthroplasty.  I have   outlined potential risks and benefits.  He understands, all questions  have been answered. Informed consent was signed and contained in the  chart.  Remain n.p.o. night before surgery.  Void on-call to OR.  Vanco 1.5 g IV on-call to OR.  Clindamycin 900 mg IV on-call to OR.  We are awaiting the final reports for his clearances.  Provided no  contraindications found from these and/or from perioperative  anesthesia workup, we will proceed.      Report Electronically Signed - 08/23/2012 08:02:30 by  Graceann Congress, PA-C  Senior Physician Assistant  Orthopaedic Surgery  Orthopaedics    Report Electronically Signed - 09/06/2012 14:15:42 by  Paulette Blanch, MD  Attending    Orthopaedic Surgery    JPR/co  D:  08/22/2012 10:11:04 PDT/PST  T:  08/22/2012 10:58:09 PDT/PST  Job #:  [id number] / [id number]

## 2012-08-22 NOTE — Progress Notes (Addendum)
Patient Name: [name]  1yr  Apr 18, 1951    Scheduled Surgery Date: 09/05/12  Proposed Surgery:  Left total hip arthoplasty   Pre-op Dx: Left hip osteoarthritis   Surgeon: Dr. Alan Mulder   Cardiologist Dr. Tiburcio Bash Low with [facility].    Problems:  Patient Active Problem List    Diagnosis Date Noted    Allergic rhinitis due to pollen 05/11/2012    EXTRINSIC ASTHMA UNSPECIFIED 05/11/2012    Diabetes type 2, controlled 03/17/2012    Hip osteoarthritis 12/10/2011    LOC PRIM OSTEOART-PELVIS 07/22/2011    Chronic gastritis 07/14/2011    Alcohol dependence, continuous 08/24/2010    Tremor 08/24/2010    Adjustment disorder with depressed mood 07/27/2010    Premature ventricular contractions (PVCs) (VPCs) 07/15/2010    S/P CABG x 6 12/24/2009    A-fib 12/24/2009     Overview Note:     Postop CABG      Anxiety 11/10/2009    CAD (coronary artery disease) 11/07/2009    Angina 10/31/2009    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 Repeat EGD in 3 years for prior hx of Barrett's. 2015      Benign neoplasm of colon 04/28/2007     Overview Note:     Colonoscopy 08/10/10 with tubular adenoma x 2; next colonoscopy due 2013, 2 day prep       FAMILY HX GI MALIGNANCY 04/28/2007    GOUT UNSPECIFIED 04/28/2007    BENIGN ESSENTIAL HYPERTENSION     OTHER AND UNSPECIFIED HYPERLIPIDEMIA       PSH:  Past Surgical History   Procedure Laterality Date    Insert intracoronary stent       Stent placement x 25 by report    Egd  2006    Colonoscopy  3/08    Removal of tonsils,<11 y/o       Tonsillectomy-childhood    Appendectomy  2010    Colonoscopy,diagnostic  08/10/2010     Colonoscopy    Upper gi endoscopy,diagnosis  06/17/2011     EGD    Upper gi endoscopy,biopsy  06/17/2011            Anesthetic Hx:  general anesthesia, denies complications    Allergies:    Allergies   Allergen Reactions    Keflex (Cephalexin) Rash       Medications:  Current Outpatient Prescriptions   Medication Sig    Albuterol (PROAIR HFA) 90 mcg/actuation inhaler Take 1-2 puffs by inhalation every 4 hours if needed for wheezing (or shortness of breath).    Allopurinol (ZYLOPRIM) 300 mg Tablet Take 1 tablet by mouth every day.    Aspirin 325 mg Tablet Tablet Reports he is going to stop asa a week prior to the surgery.    Atenolol (TENORMIN) 100 mg Tablet Take 1 tablet by mouth 2 times daily. ( blood pressure )    CENTRUM SILVER TAB take 1 tablet by oral route once daily    Cholecalciferol, Vitamin D3, (VITAMIN D) 1,000 unit Tablet Take 5 tablets by mouth every day.    CO-ENZYME Q-10 100 mg cap Take 200 mg by mouth every day. Reports he is going to stop asa a week prior to the surgery    Ezetimibe 10 mg/Simvastatin 80 mg (VYTORIN) 10-80 mg per tablet Take 1 tablet  by mouth every day.    Fenofibric Acid (TRILIPIX) 135 mg CpDR Take 1 capsule by mouth every day.    Fluticasone (FLONASE) 50 mcg/actuation nasal spray Instill 2 sprays into EACH nostril every day. As needed.    Fluticasone/Salmeterol (ADVAIR DISKUS) 500-50 mcg/dose DsDv Inhaler Take 1 puff by inhalation 2 times daily.    Lisinopril-Hydrochlorothiazide (PRINZIDE, ZESTORETIC) 20-25 mg per tablet Take 1 tablet by mouth every morning.    Lorazepam (ATIVAN) 0.5 mg Tablet Take 1 tablet by mouth 3 times daily. and 2 tabs at bedtime    Montelukast (SINGULAIR) 10 mg Tablet Take 1 tablet by mouth every evening. take in the evening    Nitroglycerin (NITROSTAT) 0.4 mg Sublingual Tablet Dissolve 1 tablet under the tongue every 5 minutes if needed for chest pain. If pain persists after a total of 3 tablets call 911.    Pantoprazole (PROTONIX) 40 mg Delayed Release Tablet Take 1 tablet by mouth 2 times daily.    PATADAY 0.2 % Drops INSTILL 1 DROP INTO EACH EYE EVERY DAY.    Sildenafil (VIAGRA) 100 mg Tablet Take 1 tablet by mouth if  needed. Prior to sexual activity    VITAMIN B-12 1,000 MCG TAB once daily    VITAMIN C PO 400 mg daily     No current facility-administered medications for this visit.                Drug/Alcohol Hx:  History   Substance Use Topics    Smoking status: Former Smoker -- 2.00 packs/day for 10 years     Types: Cigarettes    Smokeless tobacco: Never Used    Comment: quit at age 57    Alcohol Use: Yes      chardonnay mostly; 1-2 bottles of wine per day on average        CV Tests:  EKG, 06/12/2012: Sinus rhythm with occastional PVCsanterior infract also cited on or before 06/15/2011 ECG.  Myocardial perfusion test 06/13/12:  1. NO EVIDENCE OF FIXED OR REVERSIBLE ISCHEMIA.   2. MILD GLOBAL HYPOKINESIS OF THE LEFT VENTRICLE AT STRESS WHICH RAISES THE CONCERN FOR BALANCED ISCHEMIA. PARADOXICAL SEPTAL MOTION CONSISTENT WITH PRIOR SURGERY.  3. BORDERLINE NORMAL EJECTION FRACTION OF 49% AT REST AND MILDLY DECREASED EJECTION FRACTION OF 45% WITH STRESS.  4. MILD TRANSIENT ISCHEMIC DILATATION SUGGESTING THE POSSIBILITY OF TRIPLE VESSEL DISEASE.     Echocardiogram 05/29/2012: Indications: CAD, unspecified  1. Normal left ventricular diastolic function.  2. The LV function is normal. The estimated ejection fraction is 60%.  3. Normal right ventricular size and systolic function.  4. The left atrium is normal in size and structure.  5. The right atrium is normal in size and structure.  6. There is no pleural effusion.  7. Trace regurgitation of the mitral valve.  8. Trace regurgitation of the tricuspid valve.   Date          Wt        FVC            FEV1          TLC       DLCO   05/29/12    244       4.79 4.91      3.30 3.35     7.32      30.3    PFT 05/29/12:   Spirometry is suggestive but not diagnostic of airflow obstruction. No significant improvement after bronchodilator. The absence of an acute response  to aerosolized bronchodilator does not necessarily mean that the subject will not respond to a clinical trial  of aerosolized or oral bronchodilators. Reduced ERV may be due to obese body habitus.Normal DLCO.     ROS:  CV:  HTN, hyperlipidemia, CAD.s/p CABG x 6V 2/28/201. Status post 15 stent to RCA and 10 stent to LAD in [place] from [id number]. Cardiologist Dr. Tiburcio Bash Low with [facility], last visit was 06/06/12. Denies chest pain, chest tightness, shortness of breath, palpitation, syncope, orthopnea or PND date of exam.    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 Denies recent URI   Neuro: None  Musculoskeletal:  Osteoarthritis left hip pain  Med:  GERD, history of diabetes reports controlled with diet and exercise.     Activity:  Walk 1/2 a mile > limited for left hip pain    PE:  Airway:  WNL, MP 3 and Dentition good  Neck:  ROM normal  Lungs:  clear to auscultation bilaterally  Card:  regular      Pt. Instructions: NPO Guidelines, Medications, Anesthesia    Berneda Rose, RN

## 2012-08-24 ENCOUNTER — Other Ambulatory Visit: Payer: Self-pay | Admitting: GASTROENTEROLOGY

## 2012-08-24 ENCOUNTER — Ambulatory Visit

## 2012-08-24 ENCOUNTER — Ambulatory Visit: Admitting: GASTROENTEROLOGY

## 2012-08-24 ENCOUNTER — Encounter: Payer: Self-pay | Admitting: GASTROENTEROLOGY

## 2012-08-24 VITALS — BP 105/65 | HR 99 | Temp 97.7°F | Resp 18 | Ht 70.0 in | Wt 243.6 lb

## 2012-08-24 HISTORY — PX: PR COLONOSCOPY W/BIOPSY SINGLE/MULTIPLE: 45380

## 2012-08-24 HISTORY — PX: PR COLONOSCOPY FLX DX W/COLLJ SPEC WHEN PFRMD: 45378

## 2012-08-24 MED ORDER — MIDAZOLAM 1 MG/ML INJECTION SOLUTION
INTRAMUSCULAR | Status: DC
Start: 2012-08-24 — End: 2012-08-24

## 2012-08-24 MED ORDER — PROMETHAZINE 25 MG/ML INJECTION SOLUTION
INTRAMUSCULAR | Status: DC
Start: 2012-08-24 — End: 2012-08-24

## 2012-08-24 MED ORDER — SODIUM CHLORIDE 0.9 % INTRAVENOUS SOLUTION
INTRAVENOUS | Status: DC
Start: 2012-08-24 — End: 2012-08-24

## 2012-08-24 MED ORDER — DIPHENHYDRAMINE 50 MG/ML INJECTION SOLUTION
INTRAMUSCULAR | Status: DC
Start: 2012-08-24 — End: 2012-08-24

## 2012-08-24 MED ORDER — HYOSCYAMINE 0.125 MG SUBLINGUAL TABLET
1.0000 | SUBLINGUAL_TABLET | Freq: Once | SUBLINGUAL | Status: DC
Start: 2012-08-24 — End: 2012-08-24

## 2012-08-24 MED ORDER — MEPERIDINE 100 MG/ML INJECTION SOLUTION
INTRAMUSCULAR | Status: DC
Start: 2012-08-24 — End: 2012-08-24

## 2012-08-24 NOTE — Patient Instructions (Addendum)
Discharge Instructions for colonoscopy    Findings:  colonoscopy hemorrhoids  Colitis  Crohn's Disease    Crohn's disease is an ongoing disorder that causes inflammation of the digestive tract, also referred to as the gastrointestinal (GI) tract. Crohn's disease can affect any area of the GI tract, from the mouth to the anus, but it most commonly affects the lower part of the small intestine, called the ileum. The swelling extends deep into the lining of the affected organ. The swelling can cause pain and can make the intestines empty frequently, resulting in diarrhea.     Crohn's disease is an inflammatory bowel disease, the general name for diseases that cause inflammation in the intestines. Because the symptoms of Crohn's disease are similar to other intestinal disorders, such as irritable bowel syndrome and ulcerative colitis, it can be difficult to diagnose. Ulcerative colitis causes inflammation and ulcers in the top layer of the lining of the large intestine. In Crohn's disease, all layers of the intestine may be involved, and normal healthy bowel can be found between sections of diseased bowel.    Crohn's disease affects men and women equally and seems to run in some families. About 20 percent of people with Crohn's disease have a blood relative with some form of inflammatory bowel disease, most often a brother or sister and sometimes a parent or child. Crohn's disease can occur in people of all age groups, but it is more often diagnosed in people between the ages of 29 and 60. People of Jewish heritage have an increased risk of developing Crohn's disease, and African Americans are at decreased risk for developing Crohn's disease.  Crohn's disease may also be called ileitis or enteritis.    What causes Crohn's disease?    Several theories exist about what causes Crohn's disease, but none have been proven. The human immune system is made from cells and different proteins that protect people from infection.  The most popular theory is that the body's immune system reacts abnormally in people with Crohn's disease, mistaking bacteria, foods, and other substances as being foreign. The immune system's response is to attack these invaders. During this process, white blood cells accumulate in the lining of the intestines, producing chronic inflammation, which leads to ulcerations and bowel injury.    Scientists do not know if the abnormality in the functioning of the immune system in people with Crohn's disease is a cause, or a result, of the disease. Research shows that the inflammation seen in the GI tract of people with Crohn's disease involves several factors: the genes the patient has inherited, the immune system itself, and the environment. Foreign substances, also referred to as antigens, are found in the environment. One possible cause for inflammation may be the body's reaction to these antigens, or that the antigens themselves are the cause for the inflammation. Some scientists think that a protein produced by the immune system, called anti-tumor necrosis factor (TNF), may be a possible cause for the inflammation associated with Crohn's disease.    What are the symptoms?    The most common symptoms of Crohn's disease are abdominal pain, often in the lower right area, and diarrhea. Rectal bleeding, weight loss, arthritis, skin problems, and fever may also occur. Bleeding may be serious and persistent, leading to anemia. The range and severity of symptoms of Crohn's disease varies widely.    How is Crohn's disease diagnosed?    A thorough physical exam and a series of tests may be required to diagnose  Crohn's disease.    Blood tests may be done to check for anemia, which could indicate bleeding in the intestines. Blood tests may also uncover a high white blood cell count, which is a sign of inflammation somewhere in the body. By testing a stool sample, the doctor can tell if there is bleeding or infection in the  intestines.    The doctor may do an upper GI series to look at the small intestine. For this test, the person drinks barium, a chalky solution that coats the lining of the small intestine, before x rays are taken. The barium shows up white on x-ray film, revealing inflammation or other abnormalities in the intestine. If these tests show Crohn's disease, more x rays of both the upper and lower digestive tract may be necessary to see how much of the GI tract is affected by the disease.     The doctor may also do a visual exam of the colon by performing either a sigmoidoscopy or a colonoscopy. For both of these tests, the doctor inserts a long, flexible, lighted tube linked to a computer and TV monitor into the anus. A sigmoidoscopy allows the doctor to examine the lining of the lower part of the large intestine, while a colonoscopy allows the doctor to examine the lining of the entire large intestine. The doctor will be able to see any inflammation or bleeding during either of these exams, although a colonoscopy is usually a better test because the doctor can see the entire large intestine. The doctor may also do a biopsy, which involves taking a sample of tissue from the lining of the intestine to view with a microscope.    What are the complications of Crohn's disease?    The most common complication is blockage of the intestine. Blockage occurs because the disease tends to thicken the intestinal wall with swelling and scar tissue, narrowing the passage. Crohn's disease may also cause sores, or ulcers, that tunnel through the affected area into surrounding tissues, such as the bladder, vagina, or skin. The areas around the anus and rectum are often involved. The tunnels, called fistulas, are a common complication and often become infected. Sometimes fistulas can be treated with medicine, but in some cases they may require surgery. In addition to fistulas, small tears called fissures may develop in the lining of the  mucus membrane of the anus.  Nutritional complications are common in Crohn's disease. Deficiencies of proteins, calories, and vitamins are well documented. These deficiencies may be caused by inadequate dietary intake, intestinal loss of protein, or poor absorption, also referred to as malabsorption.    Other complications associated with Crohn's disease include arthritis, skin problems, inflammation in the eyes or mouth, kidney stones, gallstones, or other diseases of the liver and biliary system. Some of these problems resolve during treatment for disease in the digestive system, but some must be treated separately.    What is the treatment for Crohn's disease?    Treatment may include drugs, nutrition supplements, surgery, or a combination of these options. The goals of treatment are to control inflammation, correct nutritional deficiencies, and relieve symptoms like abdominal pain, diarrhea, and rectal bleeding. At this time, treatment can help control the disease by lowering the number of times a person experiences a recurrence, but there is no cure. Treatment for Crohn's disease depends on the location and severity of disease, complications, and the person's response to previous medical treatments when treated for reoccurring symptoms.     Some  people have long periods of remission, sometimes years, when they are free of symptoms. However, the disease usually recurs at various times over a person's lifetime. This changing pattern of the disease means one cannot always tell when a treatment has helped. Predicting when a remission may occur or when symptoms will return is not possible.    Someone with Crohn's disease may need medical care for a long time, with regular doctor visits to monitor the condition.     Drug Therapy    Anti-Inflammation Drugs:  Most people are first treated with drugs containing mesalamine, a substance that helps control inflammation. Sulfasalazine is the most commonly used of these  drugs. Patients who do not benefit from it or who cannot tolerate it may be put on other mesalamine-containing drugs, generally known as 5-ASA agents, such as Asacol, Dipentum, or Pentasa. Possible side effects of mesalamine-containing drugs include nausea, vomiting, heartburn, diarrhea, and headache.  Cortisone or Steroids:  Cortisone drugs and steroids-called corticosteriods-provide very effective results. Prednisone is a common generic name of one of the drugs in this group of medications. In the beginning, when the disease it at its worst, prednisone is usually prescribed in a large dose. The dosage is then lowered once symptoms have been controlled. These drugs can cause serious side effects, including greater susceptibility to infection.    Immune System Suppressors:   Drugs that suppress the immune system are also used to treat Crohn's disease. Most commonly prescribed are 6-mercaptopurine or a related drug, azathioprine. Immunosuppressive agents work by blocking the immune reaction that contributes to inflammation. These drugs may cause side effects like nausea, vomiting, and diarrhea and may lower a person's resistance to infection. When patients are treated with a combination of corticosteroids and immunosuppressive drugs, the dose of corticosteroids may eventually be lowered. Some studies suggest that immunosuppressive drugs may enhance the effectiveness of corticosteroids.    Infliximab (Remicade):  This drug is the first of a group of medications that blocks the body's inflammation response. The U[place] Food and Drug Administration approved the drug for the treatment of moderate to severe Crohn's disease that does not respond to standard therapies (mesalamine substances, corticosteroids, immunosuppressive agents) and for the treatment of open, draining fistulas. Infliximab, the first treatment approved specifically for Crohn's disease is a TNF substance. Additional research will need to be done in order to  fully understand the range of treatments Remicade may offer to help people with Crohn's disease.    Antibiotics:  Antibiotics are used to treat bacterial overgrowth in the small intestine caused by stricture, fistulas, or prior surgery. For this common problem, the doctor may prescribe one or more of the following antibiotics: ampicillin, sulfonamide, cephalosporin, tetracycline, or metronidazole.    Anti-Diarrheal and Fluid Replacements:  Diarrhea and crampy abdominal pain are often relieved when the inflammation subsides, but additional medication may also be necessary. Several antidiarrheal agents could be used, including diphenoxylate, loperamide, and codeine. Patients who are dehydrated because of diarrhea will be treated with fluids and electrolytes.    Nutrition Supplementation    The doctor may recommend nutritional supplements including special high-calorie liquid formulas.  A small number of patients may need to be fed intravenously for a brief time through a small tube inserted into the vein of the arm. This procedure can help patients who need extra nutrition temporarily, those whose intestines need to rest, or those whose intestines cannot absorb enough nutrition from food. There are no known foods that cause Crohn's disease. However, when  people are suffering a flare in disease, foods such as bulky grains, hot spices, alcohol, and milk products may increase diarrhea and cramping.    Surgery    Two-thirds to three-quarters of patients with Crohn's disease will require surgery at some point in their lives. Surgery becomes necessary when medications can no longer control symptoms. Surgery is used either to relieve symptoms that do not respond to medical therapy or to correct complications such as blockage, perforation, abscess, or bleeding in the intestine. Surgery to remove part of the intestine can help people with Crohn's disease, but it is not a cure. Surgery does not eliminate the disease, and it is  not uncommon for people with Crohn's Disease to have more than one operation, as inflammation tends to return to the area next to where the diseased intestine was removed.     Some people who have Crohn's disease in the large intestine need to have their entire colon removed in an operation called a colectomy. A small opening is made in the front of the abdominal wall, and the tip of the ileum, which is located at the end of the small intestine, is brought to the skin's surface. This opening, called a stoma, is where waste exits the body. The stoma is about the size of a quarter and is usually located in the right lower part of the abdomen near the beltline. A pouch is worn over the opening to collect waste, and the patient empties the pouch as needed. The majority of colectomy patients go on to live normal, active lives.    Sometimes only the diseased section of intestine is removed and no stoma is needed. In this operation, the intestine is cut above and below the diseased area and reconnected.  Because Crohn's disease often recurs after surgery, people considering it should carefully weigh its benefits and risks compared with other treatments. Surgery may not be appropriate for everyone. People faced with this decision should get as much information as possible from doctors, nurses who work with colon surgery patients (enterostomal therapists), and other patients. Patient advocacy organizations can suggest support groups and other information resources.    People with Crohn's disease may feel well and be free of symptoms for substantial spans of time when their disease is not active. Despite the need to take medication for long periods of time and occasional hospitalizations, most people with Crohn's disease are able to hold jobs, raise families, and function successfully at home and in society.    Can diet control Crohn's disease?    People with Crohn's disease often experience a decrease in appetite, which can  affect their ability to receive the daily nutrition needed for good health and healing. In addition, Crohn's disease is associated with diarrhea and poor absorption of necessary nutrients. No special diet has been proven effective for preventing or treating Crohn's disease, but it is very important that people who have Crohn's disease follow a nutritious diet and avoid any foods that seem to worsen symptoms. There are no consistent dietary rules to follow that will improve a person's symptoms.    People should take vitamin supplements only on their doctor's advice.    Can stress make Crohn's disease worse?    There is no evidence showing that stress causes Crohn's disease. However, people with Crohn's disease sometimes feel increased stress in their lives from having to live with a chronic illness. Some people with Crohn's disease also report that they experience a flare in disease when  they are experiencing a stressful event or situation. There is no type of person that is more likely to experience a flare in disease than another when under stress. For people who find there is a connection between their stress level and a worsening of their symptoms, using relaxation techniques, such as slow breathing, and taking special care to eat well and get enough sleep, may help them feel better.    Is pregnancy safe for women with Crohn's disease?    Research has shown that the course of pregnancy and delivery is usually not impaired in women with Crohn's disease. Even so, women with Crohn's disease should discuss the matter with their doctors before pregnancy. Most children born to women with Crohn's disease are unaffected.      Hemorrhoids    The term hemorrhoids refers to a condition in which the veins around the anus or lower rectum are swollen and inflamed.  Hemorrhoids may result from straining to move stool. Other contributing factors include pregnancy, aging, chronic constipation or diarrhea, and anal intercourse.   Hemorrhoids are either inside the anus (internal) or under the skin around the anus (external).     What are the symptoms of hemorrhoids?    Many anorectal problems, including fissures, fistulae, abscesses, or irritation and itching (pruritus ani), have similar symptoms and are incorrectly referred to as hemorrhoids.  Hemorrhoids usually are not dangerous or life threatening. In most cases, hemorrhoidal symptoms will go away within a few days.      Although many people have hemorrhoids, not all experience symptoms. The most common symptom of internal hemorrhoids is bright red blood covering the stool, on toilet paper, or in the toilet bowl. However, an internal hemorrhoid may protrude through the anus outside the body, becoming irritated and painful. This is known as a protruding hemorrhoid.    Symptoms of external hemorrhoids may include painful swelling or a hard lump around the anus that results when a blood clot forms. This condition is known as a thrombosed external hemorrhoid.     In addition, excessive straining, rubbing, or cleaning around the anus may cause irritation with bleeding and/or itching, which may produce a vicious cycle of symptoms. Draining mucus may also cause itching.     How common are hemorrhoids?    Hemorrhoids are very common in both men and women. About half of the population have hemorrhoids by age 76. Hemorrhoids are also common among pregnant women. The pressure of the fetus in the abdomen, as well as hormonal changes, cause the hemorrhoidal vessels to enlarge. These vessels are also placed under severe pressure during childbirth. For most women, however, hemorrhoids caused by pregnancy are a temporary problem.    How are hemorrhoids diagnosed?    A thorough evaluation and proper diagnosis by the doctor is important any time bleeding from the rectum or blood in the stool occurs. Bleeding may also be a symptom of other digestive diseases, including colorectal cancer.    The doctor will  examine the anus and rectum to look for swollen blood vessels that indicate hemorrhoids and will also perform a digital rectal exam with a gloved, lubricated finger to feel for abnormalities.     Closer evaluation of the rectum for hemorrhoids requires an exam with an anoscope, a hollow, lighted tube useful for viewing internal hemorrhoids, or a proctoscope, useful for more completely examining the entire rectum.    To rule out other causes of gastrointestinal bleeding, the doctor may examine the rectum and lower  colon (sigmoid) with sigmoidoscopy or the entire colon with colonoscopy. Sigmoidoscopy and colonoscopy are diagnostic procedures that also involve the use of lighted, flexible tubes inserted through the rectum.     What is the treatment?    Medical treatment of hemorrhoids is aimed initially at relieving symptoms. Measures to reduce symptoms include tub baths several times a day in plain, warm water for about 10 minutes and application of a hemorroidal cream or suppository to the affected area for a limited time.    Preventing the recurrence of hemorrhoids will require relieving the pressure and straining of constipation. Doctors will often recommend increasing fiber and fluids in the diet. Eating the right amount of fiber and drinking six to eight glasses of fluid (not alcohol) result in softer, bulkier stools. A softer stool makes emptying the bowels easier and lessens the pressure on hemorrhoids caused by straining. Eliminating straining also helps prevent the hemorrhoids from protruding.    Good sources of fiber are fruits, vegetables, and whole grains. In addition, doctors may suggest a bulk stool softener or a fiber supplement such as psyllium (Metamucil) or methylcellulose (Citrucel).    In some cases, hemorrhoids must be treated endoscopically or surgically. These methods are used to shrink and destroy the hemorrhoidal tissue. The doctor will perform the procedure during an office or hospital  visit.    A number of methods may be used to remove or reduce the size of internal hemorrhoids. The most common is rubber band ligation in which a rubber band is placed around the base of the hemorrhoid inside the rectum. The band cuts off circulation, and the hemorrhoid withers away within a few days.     How are hemorrhoids prevented?    The best way to prevent hemorrhoids is to keep stools soft so they pass easily, thus decreasing pressure and straining, and to empty bowels as soon as possible after the urge occurs. Exercise, including walking, and increased fiber in the diet help reduce constipation and straining by producing stools that are softer and easier to pass.             Activity:    Rest today, resume usual activitiy tomorrow     Diet:    Resume usual diet:     Medication:    Resume all usual medications    Follow up:   Call primary care physician for follow-up appointment, Follow-up appointment with me in 1  month(s) and We will notify you of your biopsy results via phone call or letter.If you do not receive notice of your results by three weeks, please call us at the phone number provided below.      During your procedure, air was pumped into your GI tract so your doctor could see clearly to make a diagnosis and/or treat your problem.     Some possible side effects you may experience are:   - Discomfort due to a distended (bloated) abdomen which will subside after a few hours to two days.   - Nausea may be a side effect of the medication and will subside.   - The medications you received may make you dizzy and sleepy, it is important that you do not drive, operate machinery or drink alcohol for at least one day.   - Severe pain is not expected and should be reported    Other side effects may include:  Flex. Sig. Or Colonoscopy: A small amount of diarrhea may follow the exam.    If problems,  call [facility] at [phone] during business hours Monday through Friday 7:30am to 4:30 pm.  After  hours call [phone] and ask to speak to the GI Fellow on call.

## 2012-08-24 NOTE — Progress Notes (Signed)
[id number] Pre-procedure History & Physical     Referring provider: Anca Rolland Porter, MD  Procedure: colonoscopy     Subjective:  HPI: [name] is a 61yr old male who presents for: fhx colon cancer, hx colon polyps     MWN:[id number]: negative.  Eyes: negative.  Ears, Nose, Mouth, Throat: negative.  CV: negative.  Resp: negative.  GI: negative.  GU: negative.  Musculoskeletal: negative.  Integumentary: negative.  Neuro: negative.  Psych: Mood pt's report, euthymic.  Endo: negative.  Heme/Lymphatic: negative.  Allergy/Immun: negative.     I did review all available medical, surgical, personal/social history.    Physical exam:  General Appearance: healthy, alert, no distress, pleasant affect, cooperative.  Nose:  normal.  Mouth: normal.  Neck:  Neck supple. No adenopathy, thyroid symmetric, normal size.  Heart:  normal rate and regular rhythm, no murmurs, clicks, or gallops.  Lungs: clear to auscultation.  Abdomen: BS normal.  Abdomen soft, non-tender.  No masses or organomegaly.  Extremities:  no cyanosis, clubbing, or edema.  Skin:  Skin color, texture, turgor normal. No rashes or lesions.    Airway Assessment:    Normal, Mallampati class 1 and Mouth opening: normal  ROM normal, thyromental distance: 4 finger breadth      Labs/imaging:  Performed at [facility], reviewed: see computer database.  Performed outside of [place] (reviewed if applicable): na    Impression/ plan:  Colonoscopy   Patient is a candidate for moderate sedation.  Patient is ASA status: 1    The procedure, risks, benefits, and alternatives were explained.  All patient questions were answered.  The informed consent was signed, and will be scanned into the computer database at a later date.    Patient barriers to learning: none    Patient/family understanding: verbalizes    Roda Shutters, MD

## 2012-08-24 NOTE — Nursing Note (Signed)
>> Veronia Beets, RN     Fri Aug 25, 2012  2:52 PM  Late Entry for 08/24/2012. As patient was waiting for the doctor,patient became agitated and very anxious. He was upset that he had to wait in the procedure room lying down on the gurney. He started yelling saying the it is unacceptable that he should be waiting and started saying the F### word several times. Offered apologies but patient remains upset and continously swearing.    >> Veronia Beets, RN     Thu Aug 24, 2012  2:06 PM  Procedure well tolerated,biopsies taken from terminal ileum,right colon,transverse,left,rectosig,oid bx's without complications,patient to RR,report to RN.    >> Elton Sin, RN     Thu Aug 24, 2012 12:51 PM  Nursing Pre-Procedure Assessment    Obrien Trilling arrived by ambulating  into clinic today at 1230 from home.      Patient has arranged for a ride home from wife, [name] who can be contacted at [phone]. Patient agrees to having individual providing ride home present while the post procedure instructions and results are given.  Instructed patient that post sedation they are not to drive or drink alcohol for the remainder of the day.      Verified procedure with the patient scheduled today for: Colonoscopy.    Outpatient Prescriptions Marked as Taking for the 08/24/12 encounter (Office Visit) with Roda Shutters, MD:  Allopurinol (ZYLOPRIM) 300 mg Tablet, Take 1 tablet by mouth every day., Disp: 90 tablet, Rfl: 3  Atenolol (TENORMIN) 100 mg Tablet, Take 1 tablet by mouth 2 times daily. ( blood pressure ), Disp: , Rfl:   Ezetimibe 10 mg/Simvastatin 80 mg (VYTORIN) 10-80 mg per tablet, Take 1 tablet by mouth every day., Disp: 90 tablet, Rfl: 3  Fenofibric Acid (TRILIPIX) 135 mg CpDR, Take 1 capsule by mouth every day., Disp: 30 capsule, Rfl: 11  Fluticasone (FLONASE) 50 mcg/actuation nasal spray, Instill 2 sprays into EACH nostril every day. As needed., Disp: 1 bottle, Rfl: 12  Lisinopril-Hydrochlorothiazide (PRINZIDE,  ZESTORETIC) 20-25 mg per tablet, Take 1 tablet by mouth every morning., Disp: 90 tablet, Rfl: 3  Lorazepam (ATIVAN) 0.5 mg Tablet, Take 1 tablet by mouth 3 times daily. and 2 tabs at bedtime, Disp: 150 tablet, Rfl: 5  Pantoprazole (PROTONIX) 40 mg Delayed Release Tablet, Take 1 tablet by mouth 2 times daily., Disp: 180 tablet, Rfl: 3      Anticoagulants: Patient has taken aspirin. Discussed with Dr. Felipa Furnace, and the procedure will continue as planned.  OTC/Herbal preparations: see medication list    Reviewed with the patient his health status in regards to allergies (including latex), medications, pregnancy, hypertension, atrial fibrilation, liver disease, bleeding disorders, diabetes,  CVA's, seizures, sleep apnea, glaucoma, cancer, prosthesis or implants, infectious diseases, surgical history; and disease of heart, lungs, liver, or kidneys. Positive history reviewed and updated in Health History section of the EMR.    Patient Active Problem List:     ESOPHAGEAL REFLUX     Barrett's esophagus     Benign neoplasm of colon     FAMILY HX GI MALIGNANCY     GOUT UNSPECIFIED     BENIGN ESSENTIAL HYPERTENSION     OTHER AND UNSPECIFIED HYPERLIPIDEMIA     Gout     INFLAM SPONDYLOPATHY NOS     Angina     CAD (coronary artery disease)     Anxiety     S/P CABG x 6     A-fib  Premature ventricular contractions (PVCs) (VPCs)     Adjustment disorder with depressed mood     Alcohol dependence, continuous     Tremor     Chronic gastritis     LOC PRIM OSTEOART-PELVIS     Hip osteoarthritis     Diabetes type 2, controlled     Allergic rhinitis due to pollen     Acute atopic conjunctivitis     EXTRINSIC ASTHMA UNSPECIFIED     -- Keflex (Cephalexin) -- Rash  Past Medical History:    Chronic ischemic heart disease, unspecified     1995            Comment:s/p MI and multiple stents    Esophageal reflux                               04/28/2007     Barrett's esophagus                             04/28/2007     Benign neoplasm of colon                         04/28/2007     Gout, unspecified                               04/28/2007     Essential hypertension, benign                                Other and unspecified hyperlipidemia                          Cardiac arrest                                  1996            Comment:ischemic related     Acute myocardial infarction of other inferior * 1996          Appendicitis, acute                             07/02/2009     Angina                                          10/31/2009     Shortness of breath                                           Coronary artery disease                                       Arthritis  Psychiatric illness                                           Diabetes type 2, controlled                     03/17/2012     EXTRINSIC ASTHMA UNSPECIFIED                    05/11/2012    Past Surgical History:    INSERT INTRACORONARY STENT                                       Comment:Stent placement x 25 by report    EGD                                              2006          COLONOSCOPY                                      3/08          REMOVAL OF TONSILS,<11 Y/O                                       Comment:Tonsillectomy-childhood    APPENDECTOMY                                     2010          COLONOSCOPY,DIAGNOSTIC                           08/10/2010      Comment:Colonoscopy    UPPER GI ENDOSCOPY,BIOPSY                        06/17/2011       Comment:     COLONOSCOPY,DIAGNOSTIC                           08/24/2012      Comment:Colonoscopy    Smoking Status: Former Smoker                   Packs/Day: 2.00  Years: 10         Types: Cigarettes    Smokeless Status: Never Used                        Comment: quit at age 1      Drug Use: No                Pre Procedure Checks:    Identaband on and two forms of identification information verified: yes  Pt completed bowel prep: GI Procedure  NPO since: 08/24/2012 0930  Belongings are with patient  under gurney.  Patient has: Glasses.  Barriers to Learning assessed: none. Patient verbalizes understanding of teaching and instructions.    Past anesthesia responses:  No problem.     Aldrete Score (adapted) Pre Sedation:  Moves 4 extremities voluntarily on command = 2  Spontaneous unlabored respirations = 2  BP changed < 20% of preanesthetic level = 2  Awake, alert, normal response to auditory stimuli = 4  Able to maintain O2 saturation > 92% on room air = 2  TOTAL PRE SEDATION ALDRETE SCORE: 12    Nursing Assessment Data Base:  Ventilation   --  Respirations: normal, unlabored.  Circulation/Perfusion -- Skin: warm, normal color and dry.  Cognition/Communication -- Behavior: alert and oriented and cooperative.  Gastro-Intestinal: no distress.  Abdomen: soft, round and non-tender.  Level of Ambulation: self.    Nursing assessment completed by:   Elton Sin, RN, RN    Date: 08/24/2012 Time: [phone]

## 2012-08-24 NOTE — Procedures (Signed)
Patient: [name]  Location:   Medical record number: [id number]  Sex: male  Age: 64yr(s)  Date of birth: 06-Apr-1951    Procedure: Colonoscopy  Subprocedure:Biopsy     Date of Service: 08/24/2012    Endoscope insertion time: 1352  Cecal intubation time: 1355  Endoscope withdrawal time: 1402    Referring Physician: PCP: Aurelio Jew, MD    PERFORMING SURGEON: Roda Shutters, MD I performed the entire procedure  ASSISTANT SURGEON: None    GI Pre-procedure Indications:  fhx colon cancer and previous hx of colon polyps     Details of the Procedure:    Informed consent was obtained for the procedure, including sedation.  Risks of perforation, hemorrhage, adverse drug reaction, pain, infection, missed lesion, incomplete examination, and aspiration were discussed.  A timeout was performed by nursing and medical staff to identify the patient (using two patient identifiers) and appropriate procedure. The patient was placed in the left lateral decubitus position. The patient was monitored continuously with EKG tracing, pulse oximetry, blood pressure monitoring, and direct observations.      A rectal examination was performed.  The Olympus colonoscope was inserted into the rectum and advanced under direct vision to the terminal ileum.  The quality of the colonic preparation was excellent.  A careful inspection was made as the colonscope was withdrawn, including a retroflexed view of the rectum; findings and interventions are described below.  Photo documentation was obtained.  The patient tolerated the procedure well, and there were no complications.  He was taken to the recovery area in stable condition.      Sedation:  Sedation was administered for single procedure/procedures    Demerol 125 mg IV, Versed 7 mg IV, Benadryl 50 mg IV and Levsin 0.125 mg Sublingual, 1 tablets    Findings and Interventions:    Terminal ileum: mild inflammation with erythema and exudate, biopsies done  Biopsies of the different  segment of the colon done  Cecum: normal  Ascending colon: normal  Transverse colon: normal  Descending colon: normal, few diverticula in the descending colon   Sigmoid colon: mild colitis with erythema and mild exudate from rectum up to 25 cm, biopsies done  Rectum: internal hemorrhoids, small, and minimal colitis, biopsies done    Impression:    Colonic diverticulosis as described above  Internal hemorrhoids as described above.  Colitis see in the terminal ileum and sigmoid, r/o IBD    Recommendation/Plan:    Await pathology.    [place] Multi[employer]ociety Task Force colorectal cancer screening (CRC)/surveillance guidelines applicable to this patient (with modifications approved by the [employer] [facility] of Gastroenterology)*:    Individual with past medical history of polyp removal: -small hyperplastic polyp(s): see average risk, asymptomatic individual guidelines -one or two <1 cm polyp(s): repeat colonoscopy in 5-10 years -3-10 adenomas or any adenoma ? 1 cm: based on clinical judgment, but generally repeat colonoscopy in 3 years -more than 10 adenomas during one exam, any adenoma with villous features, a large sessile polyp removed piecemeal, or high-grade dysplasia: based on clinical judgment -patient age, comorbidities, and estimated life expectancy should be considered when determining risk vs benefit of surveillance.    *Due to the complicated nature of colorectal cancer screening and surveillance, this is not meant to be an all inclusive guideline. The Division of Gastroenterology welcomes individual communication from providers for recommendations when a patient is encountered who does not fit in the above categories.    Roda Shutters, MD  Attending  Physician

## 2012-09-04 NOTE — Progress Notes (Addendum)
ANESTHESIA PRE OP ASSESSMENT  Date: 09/04/2012 Time: 14:31   Date of Service (Patient contact): 09/05/2012  Patient Name: [name]  61yr  11/26/1950    Consent form completed and signed by the patient: yes   Scheduled Surgery Date: 09/05/2012  Proposed Surgery:  L total hip arthroplasty  Pre-op Dx: OA hip    HISTORY  Patient Active Problem List    Diagnosis Date Noted    Allergic rhinitis due to pollen 05/11/2012    Acute atopic conjunctivitis 05/11/2012    EXTRINSIC ASTHMA UNSPECIFIED 05/11/2012    Diabetes type 2, controlled 03/17/2012    Hip osteoarthritis 12/10/2011    LOC PRIM OSTEOART-PELVIS 07/22/2011    Chronic gastritis 07/14/2011    Alcohol dependence, continuous 08/24/2010    Tremor 08/24/2010    Adjustment disorder with depressed mood 07/27/2010    Premature ventricular contractions (PVCs) (VPCs) 07/15/2010    S/P CABG x 6 12/24/2009    A-fib 12/24/2009     Overview Note:     Postop CABG      Anxiety 11/10/2009    CAD (coronary artery disease) 11/07/2009    Angina 10/31/2009    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 Repeat EGD in 3 years for prior hx of Barrett's. 2015      Benign neoplasm of colon 04/28/2007     Overview Note:     Colonoscopy 08/10/10 with tubular adenoma x 2; next colonoscopy due 2013, 2 day prep   08/24/2012 colonoscopy:       FAMILY HX GI MALIGNANCY 04/28/2007    GOUT UNSPECIFIED 04/28/2007    BENIGN ESSENTIAL HYPERTENSION     OTHER AND UNSPECIFIED HYPERLIPIDEMIA     Allergies   Allergen Reactions    Keflex (Cephalexin) Rash      Past Medical History   Diagnosis Date    Chronic ischemic heart disease, unspecified 1995     s/p MI and multiple stents    Esophageal reflux 04/28/2007    Barrett's esophagus 04/28/2007    Benign neoplasm of colon 04/28/2007    Gout, unspecified 04/28/2007    Essential  hypertension, benign     Other and unspecified hyperlipidemia     Cardiac arrest 1996     ischemic related     Acute myocardial infarction of other inferior wall, episode of care unspecified 1996    Appendicitis, acute 07/02/2009    Angina 10/31/2009    Shortness of breath     Coronary artery disease     Arthritis     Psychiatric illness     Diabetes type 2, controlled 03/17/2012    EXTRINSIC ASTHMA UNSPECIFIED 05/11/2012     Past Surgical History   Procedure Laterality Date    Insert intracoronary stent       Stent placement x 25 by report    Egd  2006    Colonoscopy  3/08    Removal of tonsils,<11 y/o       Tonsillectomy-childhood    Appendectomy  2010    Colonoscopy,diagnostic  08/10/2010     Colonoscopy    Upper gi endoscopy,biopsy  06/17/2011          Colonoscopy,diagnostic  08/24/2012     Colonoscopy    Colonoscopy,biopsy  08/24/2012            (  Not in a hospital admission)   Social History     Occupational History    Not on file.     Social History Main Topics    Smoking status: Former Smoker -- 2.00 packs/day for 10 years     Types: Cigarettes    Smokeless tobacco: Never Used    Comment: quit at age 5    Alcohol Use: Yes      chardonnay mostly; 1-2 bottles of wine per day on average     Drug Use: No    Sexually Active: Yes -- Male partner(s)    Active Inpatient Medications         No prescriptions on file.            Family History   Problem Relation Age of Onset    Hypertension Brother     Cancer Mother 29     colon, 62's        Anesthetic Hx:  general anesthesia, regional anesthesia, denies complications             CV Tests:  EKG, echocardiogram, myocardial stress test    Patient Prescribed a Beta Blocker?  Yes.  Beta Blocker Taken in Last 24 Hours?  Yes    Labs:  Lab Results   Lab Name Value Date/Time    WHITE BLOOD CELL COUNT 6.2 08/22/2012 1131    HEMOGLOBIN 14.6 08/22/2012 1131    HEMATOCRIT 42.0 08/22/2012 1131    PLATELET COUNT 125* 08/22/2012 1131     Lab Results   Lab Name  Value Date/Time    SODIUM 133* 08/22/2012 1131    POTASSIUM 3.2* 08/22/2012 1131    CHLORIDE 101 08/22/2012 1131    CARBON DIOXIDE TOTAL 21* 08/22/2012 1131    UREA NITROGEN, BLOOD (BUN) 12 08/22/2012 1131    CREATININE BLOOD 0.96 08/22/2012 1131    GLUCOSE 149* 08/22/2012 1131     Lab Results   Lab Name Value Date/Time    INR 1.07 08/22/2012 1131    APTT 31.2 08/22/2012 1131     No results found for this basename: POCPREG, PREGURINE       ROS:  CV:  HTN, hyperlipidemia, CABG, CAD, stents  Resp:  asthma  Neuro: None  Musculoskeletal:  osteoarthritis  Med:  GERD well controlled with medications, gout    Activity:  walk 2 blocks, limited by pain    VITAL SIGNS:  Vital Signs (Last Recorded):    Temp: 36.4 C (97.5 F) (12/03 0709)  Temp src: --  Pulse: 91  (12/03 0709)  BP: 119/70 mmHg (12/03 0709)  Resp: 16  (12/03 0709)  SpO2: 97 % (12/03 0709)  Height: 177.8 cm (5\' 10" ) (12/02 1413)  Weight: 111.9 kg (246 lb 11.1 oz) (12/03 0709)    PE:  Mallampati image     Mallampati Class:  1  Oral Eval: Mouth opening normal and No loose teeth  Neck ROM:  full  Thyroid-mentum distance in fingerbreaths: 2  Lungs:  normal, clear to auscultation bilaterally  Card:  regular    ASA Status: 3 - Moderate to severe systemic disease that limits activity but not incapacitating    NPO Guidelines Met: yes    Consent:  Risks and benefits of General and Regional Anesthesia discussed with patient.  Questions answered and patient wishes to proceed.    Impression and Anesthetic Plan:   GETA +/- epidural    Electronically signed by:    ER [place], MD PGY2  Department of  Anesthesiology & Pain Management  PI: [id number]  Pager: [phone]    This patient was seen, evaluated, and care plan was developed with the resident.  I agree with the assessment and plan as outlined in the resident's note.     Report electronically signed by:   Isabelle Course Ethelene Hal, MD   Attending Anesthesiologist  [phone]

## 2012-09-04 NOTE — Progress Notes (Addendum)
ANESTHESIOLOGY OPERATIVE NOTE  Date: 09/04/2012 Time: 14:31    Date of Service (Patient contact): 09/05/2012    Procedure: L THA    Anesthesia: General    Estimated Blood Loss: 700 mL    Intravenous Fluids: Crystalloid 4100 ml, Albumin 5% 250 ml and pRBC 1 unit ml    Urine output: 185 mL  Anesthetic Complications: No apparent anesthetic complications  Disposition: to PACU, VSS, report given to RN    Electronically signed by:    ER Fuller Plan, MD PGY2  Department of Anesthesiology & Pain Management  PI: [phone]  Pager: [phone]        ANESTHESIOLOGY ATTENDING NOTE  Date: 09/05/2012 Time: 13:49      Date of service (Patient contact): 09/05/2012    Procedure: left THA    Anesthesia: General    Perioperative Presence: Resident Supervision    Concurrency: TWO-FOUR CONCURRENT CASES:  I was physically present for the key portion(s) of the procedure and during other times, was immediately available to return to the procedure.  The key portions of the procedure are documented below.  During the time in which my physical presence was not required, a designated backup teaching anesthesiologist was immediately available.     Name of Backup Anesthesiologist: Grow    Key Portions:  Induction, Periodic Monitoring, Emergence and Other Key Portions: arterial line    Electronically Signed by:  Isabelle Course. Ethelene Hal, MD  Attending Anesthesiologist  Anesthesiology and Pain Medicine  [phone]  Pager: [phone]                                   The information contained on this form is true and correct to the best of my knowledge. Further, I understand that if I misrepresent, falsify or conceal information regarding my participation in the professional service described above, I may be subject to fine, imprisonment, or civil penalty under applicable federal laws.

## 2012-09-05 ENCOUNTER — Inpatient Hospital Stay: Admission: RE | Admit: 2012-09-05 | Attending: ORTHOPAEDIC SURGERY | Admitting: ORTHOPAEDIC SURGERY

## 2012-09-05 DIAGNOSIS — M2550 Pain in unspecified joint: Principal | ICD-10-CM

## 2012-09-05 HISTORY — DX: Chronic obstructive pulmonary disease, unspecified: J44.9

## 2012-09-05 HISTORY — PX: PR TOTAL HIP ARTHROPLASTY: 27130

## 2012-09-05 HISTORY — DX: Other abnormalities of gait and mobility: R26.89

## 2012-09-05 MED ORDER — ATENOLOL 100 MG TABLET
100.0000 mg | ORAL_TABLET | Freq: Two times a day (BID) | ORAL | Status: DC
Start: 2012-09-05 — End: 2012-09-09
  Administered 2012-09-05 – 2012-09-09 (×8): 100 mg via ORAL
  Filled 2012-09-05 (×9): qty 1

## 2012-09-05 MED ORDER — ALBUTEROL SULFATE HFA 90 MCG/ACTUATION AEROSOL INHALER
1.0000 | INHALATION_SPRAY | RESPIRATORY_TRACT | Status: DC | PRN
Start: 2012-09-05 — End: 2012-09-09
  Filled 2012-09-05: qty 8

## 2012-09-05 MED ORDER — ACETAMINOPHEN 1,000 MG/100 ML (10 MG/ML) INTRAVENOUS SOLUTION
1000.0000 mg | Freq: Once | INTRAVENOUS | Status: AC
Start: 2012-09-05 — End: 2012-09-05
  Administered 2012-09-05: 1000 mg via INTRAVENOUS

## 2012-09-05 MED ORDER — DALTEPARIN (PORCINE) 5,000 ANTI-XA UNIT/0.2 ML SUBCUTANEOUS SYRINGE
5000.0000 [IU] | INJECTION | Freq: Every day | SUBCUTANEOUS | Status: DC
Start: 2012-09-05 — End: 2012-09-09
  Administered 2012-09-05 – 2012-09-09 (×5): 5000 [IU] via SUBCUTANEOUS
  Filled 2012-09-05 (×6): qty 0.2

## 2012-09-05 MED ORDER — MAGNESIUM HYDROXIDE 400 MG/5 ML ORAL SUSPENSION
30.0000 mL | Freq: Two times a day (BID) | ORAL | Status: DC | PRN
Start: 2012-09-05 — End: 2012-09-09

## 2012-09-05 MED ORDER — LACTATED RINGERS IV INFUSION
INTRAVENOUS | Status: DC
Start: 2012-09-05 — End: 2012-09-05
  Administered 2012-09-05: 08:00:00 via INTRAVENOUS

## 2012-09-05 MED ORDER — MONTELUKAST 10 MG TABLET
10.0000 mg | ORAL_TABLET | Freq: Every day | ORAL | Status: DC
Start: 2012-09-05 — End: 2012-09-09
  Administered 2012-09-05 – 2012-09-08 (×4): 10 mg via ORAL
  Filled 2012-09-05 (×4): qty 1

## 2012-09-05 MED ORDER — ONDANSETRON HCL (PF) 4 MG/2 ML INJECTION SOLUTION
4.0000 mg | INTRAMUSCULAR | Status: DC | PRN
Start: 2012-09-05 — End: 2012-09-05

## 2012-09-05 MED ORDER — LISINOPRIL 20 MG TABLET
20.0000 mg | ORAL_TABLET | Freq: Every day | ORAL | Status: DC
Start: 2012-09-05 — End: 2012-09-09
  Administered 2012-09-05 – 2012-09-09 (×5): 20 mg via ORAL
  Filled 2012-09-05 (×6): qty 1

## 2012-09-05 MED ORDER — HYDROCHLOROTHIAZIDE 25 MG TABLET
25.0000 mg | ORAL_TABLET | Freq: Every day | ORAL | Status: DC
Start: 2012-09-05 — End: 2012-09-09
  Administered 2012-09-06 – 2012-09-09 (×4): 25 mg via ORAL
  Filled 2012-09-05 (×4): qty 1

## 2012-09-05 MED ORDER — FENTANYL (PF) 50 MCG/ML INJECTION SOLUTION
25.0000 ug | INTRAMUSCULAR | Status: DC | PRN
Start: 2012-09-05 — End: 2012-09-05

## 2012-09-05 MED ORDER — FLUTICASONE PROPIONATE 50 MCG/ACTUATION NASAL SPRAY,SUSPENSION
2.0000 | Freq: Two times a day (BID) | NASAL | Status: DC | PRN
Start: 2012-09-05 — End: 2012-09-09
  Filled 2012-09-05: qty 16

## 2012-09-05 MED ORDER — BISACODYL 10 MG RECTAL SUPPOSITORY
10.0000 mg | Freq: Two times a day (BID) | RECTAL | Status: DC | PRN
Start: 2012-09-05 — End: 2012-09-09

## 2012-09-05 MED ORDER — CLINDAMYCIN 900 MG/50 ML IN 5 % DEXTROSE INTRAVENOUS PIGGYBACK
900.0000 mg | INJECTION | INTRAVENOUS | Status: AC
Start: 2012-09-05 — End: 2012-09-05
  Administered 2012-09-05: 900 mg via INTRAVENOUS

## 2012-09-05 MED ORDER — VANCOMYCIN 1.5 GRAM/250 ML IN DEXTROSE 5% INTRAVENOUS SOLUTION
1500.0000 mg | INTRAVENOUS | Status: AC
Start: 2012-09-05 — End: 2012-09-05
  Administered 2012-09-05: 1500 mg via INTRAVENOUS
  Filled 2012-09-05: qty 250

## 2012-09-05 MED ORDER — LIDOCAINE HCL 10 MG/ML (1 %) INJECTION SOLUTION
0.1000 mL | INTRAMUSCULAR | Status: DC | PRN
Start: 2012-09-05 — End: 2012-09-05
  Administered 2012-09-05: 0.1 mL

## 2012-09-05 MED ORDER — ONDANSETRON HCL (PF) 4 MG/2 ML INJECTION SOLUTION
4.0000 mg | Freq: Three times a day (TID) | INTRAMUSCULAR | Status: DC | PRN
Start: 2012-09-05 — End: 2012-09-09

## 2012-09-05 MED ORDER — CLINDAMYCIN 600 MG/50 ML IN 5 % DEXTROSE INTRAVENOUS PIGGYBACK
600.0000 mg | INJECTION | Freq: Three times a day (TID) | INTRAVENOUS | Status: AC
Start: 2012-09-05 — End: 2012-09-06
  Administered 2012-09-05 – 2012-09-06 (×2): 600 mg via INTRAVENOUS
  Filled 2012-09-05 (×2): qty 50

## 2012-09-05 MED ORDER — LACTATED RINGERS IV INFUSION
INTRAVENOUS | Status: DC
Start: 2012-09-05 — End: 2012-09-05

## 2012-09-05 MED ORDER — VANCOMYCIN 1.5 GRAM/250 ML IN DEXTROSE 5% INTRAVENOUS SOLUTION
1500.0000 mg | Freq: Two times a day (BID) | INTRAVENOUS | Status: AC
Start: 2012-09-05 — End: 2012-09-05
  Administered 2012-09-05: 1500 mg via INTRAVENOUS
  Filled 2012-09-05: qty 250

## 2012-09-05 MED ORDER — ACETAMINOPHEN 325 MG TABLET
650.0000 mg | ORAL_TABLET | ORAL | Status: DC | PRN
Start: 2012-09-05 — End: 2012-09-09
  Administered 2012-09-06 (×2): 650 mg via ORAL
  Filled 2012-09-05 (×3): qty 2

## 2012-09-05 MED ORDER — EZETIMIBE 10 MG-SIMVASTATIN 80 MG TABLET
1.0000 | ORAL_TABLET | Freq: Every day | ORAL | Status: DC
Start: 2012-09-05 — End: 2012-09-09
  Administered 2012-09-06 – 2012-09-09 (×4): 1 via ORAL
  Filled 2012-09-05 (×6): qty 1

## 2012-09-05 MED ORDER — METOCLOPRAMIDE 5 MG/ML INJECTION SOLUTION
10.0000 mg | INTRAMUSCULAR | Status: DC | PRN
Start: 2012-09-05 — End: 2012-09-05

## 2012-09-05 MED ORDER — NACL 0.9% IV INFUSION
INTRAVENOUS | Status: DC
Start: 2012-09-05 — End: 2012-09-07
  Administered 2012-09-05 – 2012-09-06 (×4): via INTRAVENOUS

## 2012-09-05 MED ORDER — HYDROMORPHONE 50 MG/50 ML (1 MG/ML) IN 0.9 % SOD.CHLORIDE IV PUMP RESV
INTRAVENOUS | Status: DC
Start: 2012-09-05 — End: 2012-09-07
  Administered 2012-09-05: 14:00:00 via INTRAVENOUS
  Filled 2012-09-05: qty 50

## 2012-09-05 MED ORDER — CALCIUM 500 MG (AS CARBONATE)-VITAMIN D3 5 MCG (200 UNIT) TABLET
1.0000 | ORAL_TABLET | Freq: Two times a day (BID) | ORAL | Status: DC
Start: 2012-09-05 — End: 2012-09-09
  Administered 2012-09-05 – 2012-09-09 (×8): 1 via ORAL
  Filled 2012-09-05 (×8): qty 1

## 2012-09-05 MED ORDER — HYDROMORPHONE (PF) 1 MG/ML INJECTION SYRINGE
0.2000 mg | INJECTION | INTRAMUSCULAR | Status: DC | PRN
Start: 2012-09-05 — End: 2012-09-09

## 2012-09-05 MED ORDER — FLUTICASONE-SALMETEROL 250 MCG-50 MCG/DOSE DISK POWDER FOR ORAL INHALATION
1.0000 | DISK | Freq: Two times a day (BID) | RESPIRATORY_TRACT | Status: DC
Start: 2012-09-05 — End: 2012-09-09
  Administered 2012-09-06 – 2012-09-09 (×7): 1 via RESPIRATORY_TRACT
  Filled 2012-09-05: qty 14

## 2012-09-05 MED ORDER — ALLOPURINOL 300 MG TABLET
300.0000 mg | ORAL_TABLET | Freq: Every day | ORAL | Status: DC
Start: 2012-09-05 — End: 2012-09-09
  Administered 2012-09-06 – 2012-09-09 (×4): 300 mg via ORAL
  Filled 2012-09-05 (×4): qty 1

## 2012-09-05 MED ORDER — DIPHENHYDRAMINE 50 MG/ML INJECTION SOLUTION
25.0000 mg | Freq: Four times a day (QID) | INTRAMUSCULAR | Status: DC | PRN
Start: 2012-09-05 — End: 2012-09-05

## 2012-09-05 MED ORDER — PANTOPRAZOLE 40 MG TABLET,DELAYED RELEASE
40.0000 mg | DELAYED_RELEASE_TABLET | Freq: Two times a day (BID) | ORAL | Status: DC
Start: 2012-09-05 — End: 2012-09-09
  Administered 2012-09-05 – 2012-09-09 (×8): 40 mg via ORAL
  Filled 2012-09-05 (×8): qty 1

## 2012-09-05 MED ORDER — SENNOSIDES 8.6 MG TABLET
2.0000 | ORAL_TABLET | Freq: Every day | ORAL | Status: DC
Start: 2012-09-05 — End: 2012-09-09
  Administered 2012-09-05 – 2012-09-06 (×2): 17.2 mg via ORAL
  Filled 2012-09-05 (×2): qty 2

## 2012-09-05 MED ORDER — NALOXONE 0.4 MG/ML INJECTION SOLUTION
0.1000 mg | INTRAMUSCULAR | Status: DC | PRN
Start: 2012-09-05 — End: 2012-09-09

## 2012-09-05 MED ORDER — FERROUS SULFATE 324 MG (65 MG IRON) TABLET,ENTERIC COATED
324.0000 mg | DELAYED_RELEASE_TABLET | Freq: Two times a day (BID) | ORAL | Status: DC
Start: 2012-09-05 — End: 2012-09-09
  Administered 2012-09-05 – 2012-09-09 (×8): 324 mg via ORAL
  Filled 2012-09-05 (×8): qty 1

## 2012-09-05 MED ORDER — MULTIVITAMIN WITH IRON TABLET
1.0000 | ORAL_TABLET | Freq: Every day | ORAL | Status: DC
Start: 2012-09-05 — End: 2012-09-09
  Administered 2012-09-06 – 2012-09-09 (×4): 1 via ORAL
  Filled 2012-09-05 (×4): qty 1

## 2012-09-05 MED ORDER — OXYCODONE 5 MG TABLET
5.0000 mg | ORAL_TABLET | ORAL | Status: DC | PRN
Start: 2012-09-05 — End: 2012-09-09
  Administered 2012-09-05 – 2012-09-09 (×6): 10 mg via ORAL
  Filled 2012-09-05 (×6): qty 2

## 2012-09-05 MED ORDER — HYDROMORPHONE (PF) 1 MG/ML INJECTION SYRINGE
0.2000 mg | INJECTION | INTRAMUSCULAR | Status: DC | PRN
Start: 2012-09-05 — End: 2012-09-05

## 2012-09-05 MED ORDER — LORAZEPAM 1 MG TABLET
0.5000 mg | ORAL_TABLET | Freq: Three times a day (TID) | ORAL | Status: DC
Start: 2012-09-05 — End: 2012-09-09
  Administered 2012-09-05 – 2012-09-09 (×12): 0.5 mg via ORAL
  Filled 2012-09-05 (×13): qty 1

## 2012-09-05 MED ORDER — DOCUSATE SODIUM 100 MG CAPSULE
100.0000 mg | ORAL_CAPSULE | Freq: Two times a day (BID) | ORAL | Status: DC
Start: 2012-09-05 — End: 2012-09-09
  Administered 2012-09-05 – 2012-09-09 (×6): 100 mg via ORAL
  Filled 2012-09-05 (×6): qty 1

## 2012-09-05 NOTE — Procedures (Signed)
Date of operation: 09/05/12    Operation : L [place]/Tritanium Hybrid THR     Anesthesia : G/A    Implants : cup :TRITANIUM Size 56 mm OD + 1 40mm acetab scerw                   Liner : 56x36 mm, neutral 0 deg                   Stem:  [place]  44 no: 1                   Head : Metal/Ceramic size 36mm, -2.38mm    Pre-op diagnosis: L hip DJD    Post-op diagnosis: same    Surgeons : Melvia Heaps, M Refaat      Drains:  None    Complications:  None    Outcome: Satisfactory    EBL: 700 mL    Fluids:   4000 mL crystalloid  500 mL albumin  1u pRBC    UOP: 200 mL      Samples sent: none    Infection control : Laminar flow, Stryker hoods, double glove, OR discipline, iv ancef + vanc on induction, IOBAN, thorough wash at end of surgery including with dilute betadine, monocryl to skin, silver dressing, minimal dressing changes till wound healed.    DVT prophylaxis : Contralateral calf pump intra-op, calf pumps post op, LMWH 2 weeks then oral aspirin for 4 weeks, early mobilization.    Pre-op :     This patient was seen by me in pre-op assessment clinic where the indication, clinical findings and imaging were reviewed. The patient was informed that a hip replacement is not an essential operation and that it is purely a quality of life improving operation. Non-operative options were offered and the potential benefits of the hip replacement were explained : pain relief and mobility improvement. The risks of the operation were also explained which included, but not exclusively, LLD, sciatic nerve palsy, dislocation, infection, loosening, wear, continued pain, etc.      Preop planning : The patient's pre-op xrays with the appropriate marker were used for digital templating using TRAUMACAD and apart from the sizing of the implants, the LT-HC distance was also measured which was used intra-op.      PROCEDURE: The patient was checked in the pre-op area and the site marked before being transferred to the OR where the anesthetic  was given and a foley inserted.    Position:  Right lateral position using a pelvic positioner and all bony prominences protected. Axillary roll inserted. The limb was suspended from an IV pole with a 2" bandage dipped in betadine making sure that the skin was safe to do so. The limb was then prepped with Chloroprep, drapped with sterile drapes and leg bag leaving exposed a small aread of skin where the incision would be made. That area of skin was then draped with IOBAN.    Timeout was then done, checking the patient, side, operation, instruments, abiotics, co-morbidities, allergies, DVT prophylaxis etc.    Incision made centered over the GT curved slightly posteriorly. Skin, fat and deep fascia incised with adequate haemostasis. The superficial layer of the G Max aponeurosis incised extending it into the proximal ITB, with limited incision of the ITB. This was opened and the Gmax split along the length of the fibres. A Charnley retractor was introduced and the sciatic nerve located and protected throughout the case.  Posterior Approach: A Hoffman retractor was inserted deep to the G Med muscle and the piriformis exposed as were the triceps tendon and the quadratus. The capsule was incised with a Bovey along the superior aspect of the piriformis, followed by its attachment being divided deep in the piriformis fossa. The OI + gamelli were incised at their insertion and this was carried down to the quadratus, dividing it such that a cuff of tissue was left back of the femur to aid closure. The capsule was incised along the same line and the joint opened out. The hip was dislocated and the neck cut at the predetermined level according to the templating.  The head was discarded. Part of the anterior capsule was excised as it was thick and shortened. A retractor was inserted anteriorly avoiding the femoral vessels, another under the TAL and a Charnley pin inserted into the supra-acetabular bone. The lower  limb was kept extended at the hip and flexed at the knee from this point on as long as that posteriorpin was still in the posterior column in order to protect the sciatic nerve.    Acetabular preparation and implantation:  The acetabulum was exposed and assessed. The pulvinar was excised and the medial wall assessed. Reaming started at 48 mm, initially heading medially  to get rid of the medial osteophytes and then posteriorly and superiorly. The TAL was used as a guide to [employer]. The final reamer was 55mm.  Trial with a 55 cup was made and found to be satisfactory.  A 56mm OD STRYKER TRITANIUM cup  was inserted with about 45deg inclination and 20deg anteversion. The primary stability was excellent.  It was found appropriate to insert 1 screw in to improve the primary stability of the cup. Rim osteophytes were removed with an osteotome.  A 56x36 mm ID Poly liner was inserted with good hold.  A raytech left in the acetabulum while the femur was being prepared to protect the poly.  The pin from the acetabulum were removed and the hip was flexed and internally rotated to prepare the femur. Betadine soaked lap was applied to the proximal end of the wound in order to prevent skin commensals from being carried into the femur during canal instrumentation.    Preparation of the femur and femoral component implantation:  A box chisel was used to open up the proximal femur and cancellous bone removed from the lateral aspect of the metaphysis.  Canal opener and pencil reamers used to widen the canal.  A lateralizing reamer was used to improve the entry to the canal.  The canal broached sequentially starting with 35.65mm broach.  Trial reduction with size 44 #1 and 36mm -5 mm Head noted that the construct was too long, so the real stem was inserted further down than this. The stability/construct positions checked as below:-    1) full Ext + full ER- no impingement between GT and acetabulum  2) Knee flexion beyond 90deg  to test quads (rectus) tightness suggesting that limb not excessively lengthened  3)femoral length at level of the knee as compared with preop  4)"position of sleep" - LE held at ankle and hip allowed to passively flex to 45deg, 30 deg adduction and 45 deg IR.  The whole LE was wobbled in this position  5) The hip was then flexed to 90deg and more making sure that the thigh was not impinging on the pelvic positioner.  It was then internally rotated using the tibia as a goniometer through  0 to 75 deg IR keeping a thumb on the femoral head to palpate for subluxation  6) the combined anterversion was between 30 and 45deg.    Broach removed after marking depth, cement restrictor inserted at 18cms from GT, canal washed and dried.  Suction catheter + 2" nugauze soaked with hydrogen peroxide inserted to keep canal dry.  SIMPLEX with TOBRAMYCIN cement mixed in a gun with vacuum and inserted into the canal with pressurization.    [place] 44/ no 1 stem inserted avoiding varus to the pre-determined depth (deeper than the trial) and about 20deg anteversion.  All cementophytes removed when cement cured and trial reduction with 49mm/-2.5 head was satisfactory with stability as above, so a definitive 36mm/-2.5 ceramic head was inserted, acetabulum washed and hip reduced.  Before reduction, the acetabulum was checked visually and digitally for 3rd bodies.  Stability was checked again as above.  Anterior osteophytes/thickened capsule excised anteriorly to prevent impingement.    Closure:   Washed with bulb syringe lavage and dilute betadine. Short external rotators repaired with looped PDS using the needle from posterior to anterior and protecting the sciatic nerve throughout,  Next the ITB was closed with with looped PDS, deep fascia with vicryl, sudermal vicryl and subcuticular 3/0 monocryl to skin.  The IOBAN was only peeled back at this stage, skin washed with betadine, saline and dried with a lap sponge. Steri-strips then  applied across the skin edges followed by  ACTICOAT dressing , absorbent pads, Tagaderm or IOBAN.Marland Kitchen    A post OP xray was taken with the patient supine and still under anesthetic. This was considered satisfactory and the hip was not dislocated.    Dispo:  The patient went to PACU in a stable condition with warm feet, palpable pulses and satisfactory leg lenghts.    Post op instructions:  IV antibiotics for 3 doses  DVT prophylaxis - calf pump on both LEs.  LMWH to start 12hours post op  Allow FWB  Change dressing only if soaked, otherwise change dressing on day of discharge and then at pod 10  Clinic appointment in 6 weeks  Strict posterior hip precautions for 3 months      Potential Modifiers: Any of the Following Modifiers Apply To This Case?  -22 Unusual procedural service (must document what made it unusual)  no    Operating Room Procedure Presence: Dr. Alan Mulder participated in and was physically present for the entire procedure.    The information contained on this form is true and accurate to the best of my knowledge. Further, I understand that if I misrepresent, falsify or conceal information regarding my participation in the professional service described above, I may be subject to fine, imprisonment, or civil penalty under applicable federal laws.    Electronically Signed By:  Epifanio Lesches, MD  Adult Reconstruction  Orthopaedic Surgery  PI # [phone]

## 2012-09-05 NOTE — Procedures (Addendum)
MRN [id number]  [name]  DOB 12-30-1950  LINE PROCEDURE NOTE  Note started: 09/05/2012  17:50        * Date of service:09/05/2012      LOS:  LOS: 0 days         Pre Procedure Diagnosis: OA hip  Post Procedure Diagnosis: same  * Patient's specific hospital location during procedure:OR       Central line inserter ID [id number] Name, Daisy Floro  * Occupation of inserter: Resident  Consent form completed and signed by the patient/guardian: Yes  ID verified by two sources (select any two from list): MRN and DOB    Was inserter a member of PIC/IV Team no     Appropriate procedural pause was taken.  Reason for Insertion:  TYPE OF PROCEDURE: ARTERIAL LINE PLACEMENT    Indication: New indication   * Hand hygiene: Inserter performed hand hygiene prior to central line insertion: yes  Sterility: Cap, , Gloves, and Mask,  Prep: Chlorhexidine  Was skin prep completely dry at time of first skin puncture yes  Type of Anesthesia/Local Anesthetic: N/A  Artery: Radial right  Catheter Size: 22      POST PROCEDURE  Estimated blood loss: minimal  Radiology: N/A  Suture:   N/A  Complications: NoneDid this insertion attempt result in a successful central line placement? yes    * Data needed by Infection Control for CLIP reporting   CLIP notification required for each line note (CL,PA Cath): N/A (Arterial line placed ONLY)    Electronically signed by:    ER Fuller Plan, MD PGY2  Department of Anesthesiology & Pain Management  PI: [id number]  Pager: [phone]      This patient was seen, evaluated, and care plan was developed with the resident.  I agree with the assessment and plan as outlined in the resident's note.  I was also present for the entire procedure.     Report electronically signed by:   Isabelle Course Ethelene Hal, MD  Attending Anesthesiologist  Anesthesiology and Pain Medicine  [phone]  Pager: [phone]

## 2012-09-05 NOTE — Nurse Assessment (Signed)
ASSESSMENT NOTE    Note Started: 09/05/2012, 20:13     Initial assessment completed and recorded in EMR.  Report received from PACU nurse, and orders reviewed. Plan of Care reviewed and appropriate, discussed with patient.  Mackie Pai, RN

## 2012-09-05 NOTE — Progress Notes (Addendum)
ANESTHESIOLOGY POST OPERATIVE ASSESSMENT  Date: 09/05/2012 Time: 13:52   Date of Service (Patient contact): 09/05/12    S/p general anesthesia for left total hip arthroplasty    VITAL SIGNS    Vital Signs (Last Recorded):  BP: 115/72 mmHg  Pulse: 92   Resp: 14   Temp Max: 36.8 C (98.2 F)  (Last 24 hours)  Temp: 36.8 C (98.2 F)  SpO2: 97 % on    O2 Device (Oxygen Therapy): room air    RESPIRATORY FUNCTION     Lungs:  clear to auscultation bilaterally   Airway patency: Clear  Airway intervention: No intervention required     CARDIOVASCULAR FUNCTION    Cardiac:  regular    NEUROVASCULAR FUNCTION    Mental status: awake and alert    PAIN ASSESSMENT     pain Level Controlled    NAUSEA / VOMITING    Patient with no S&S of nausea .  Patients current PO status: Regular Diet    POSTOPERATIVE HYDRATION    Intake/Output Summary (Last 24 hours) at 09/05/12 1352  Last data filed at 09/05/12 1319   Gross per 24 hour   Intake   4531 ml   Output    985 ml   Net   3546 ml     Current:   In: 4531 [Oral:180; Crystalloid:4100; Colloid:250; Blood:1]  Out: 985 [Urine:285; Other:700]     ADDITIONAL PERTINENT INFORMATION    Complications: no    Electronically signed by:   Hillery Jacks, MD  PGY-2/CA-1  Department of Anesthesiology and Pain Medicine  PI # [phone]  Pager # [phone]    This patient was seen by the resident.  I reviewed the patient  and care plan was developed with the resident.  I agree with the assessment and plan as outlined in the resident's note.       Report electronically signed by:   Isabelle Course Ethelene Hal, MD  Attending Anesthesiologist  Anesthesiology and Pain Medicine  [phone]  Pager: [phone]

## 2012-09-05 NOTE — Nurse Assessment (Signed)
PACU ADMIT NURSING NOTE    Note Started: 09/05/2012, 12:54     Received patient from OR at 1230 hours via bed.  Monitor and Alarms on.  Patient sleepy but arousable. Sharene Skeans, RN

## 2012-09-05 NOTE — Progress Notes (Signed)
Orthopaedic Post-op Note  Date: 09/05/2012  Unit: D14 ORTHOPEDICS/TRAUMA  Attending of Record: Treatment Team: Attending Provider: Paulette Blanch, MD    Procedures: L THA Alan Mulder, 09/05/12)    Subjective:  Patient doing well, pain controlled    Objective:  Temp src:  [-]   Temp:  [36.4 C (97.5 F)-36.8 C (98.2 F)]   Pulse:  [91-101]   BP: (102-119)/(65-73)   Resp:  [14-18]   SpO2:  [95 %-100 %]   Intake/Output:   Weight: 111.9 kg (246 lb 11.1 oz) (09/05/12 0709)     Last 24hr I/O's:        PE:  Gen: NAD, comfortable  Left lower extremity:  Wound: dressing c/d/i without strikethrough  Vascular:  toes warm and well perfused, capillary refill < 2s  Motor function: intact DF/PF/EHL  Sensory: S/S/SP/DP/T intact to light touch    Laboratory Tests:  Lab Results   Lab Name Value Date/Time    WBC 9.6 09/05/2012 12:28 PM    HGB 12.2* 09/05/2012 12:28 PM    HCT 33.9* 09/05/2012 12:28 PM    PLT 149 09/05/2012 12:28 PM     Lab Results   Lab Name Value Date/Time    NA 133* 08/22/2012 11:31 AM    K 3.2* 08/22/2012 11:31 AM    CL 101 08/22/2012 11:31 AM    CO2 21* 08/22/2012 11:31 AM    BUN 12 08/22/2012 11:31 AM    CR 0.96 08/22/2012 11:31 AM    GLU 149* 08/22/2012 11:31 AM     INR   No results found for this basename: INR:* in the last 24 hours  PTT   No components found with this basename: IPTT:*    Assesment and Plan:  [name] is a 26yr male now POD #0  from L THA    - PT/OT  - Pain control, PCA  - DVT prophylaxis: fragmin and SCDs  - Weight bearing status: WBAT LLE, posterior hip precautions  - Antibiotics: 24hr postop prophylaxis (Vanc, Clinda)  - Postop XRs ordered   - Dispo planning      Electronically signed by  Epifanio Lesches, MD  Adult Reconstruction  Orthopaedic Surgery  PI # [phone]

## 2012-09-05 NOTE — Nurse Focus (Signed)
PACU TRANSFER NOTE    Note Started: 09/05/2012, 14:52     Patient transferred to d14 unit via bed with room air at 1430 hours. Bed down, locked, side rails up times 4, and call light in reach. Belongings with with patient. Report in EMR and RN notified of arrival. Assessment unchanged. Sharene Skeans, RN

## 2012-09-05 NOTE — H&P (Signed)
Ortho Surgery Interval H&P    H&P performed on 08/22/12 reviewed.  No interval changes    PE  Heart RRR  Chest CTA B    A/P:  61yo M with symptomatic L hip DJD that has failed conservative treatment.   Plan for L THA  Site marked  All questions answered    Epifanio Lesches, MD  Adult Reconstruction  Orthopaedic Surgery  PI # [phone]

## 2012-09-06 ENCOUNTER — Encounter: Payer: Self-pay | Admitting: Clinical

## 2012-09-06 MED ORDER — HYDROCODONE 10 MG-ACETAMINOPHEN 325 MG TABLET
1.0000 | ORAL_TABLET | ORAL | Status: DC | PRN
Start: 2012-09-06 — End: 2012-09-06
  Administered 2012-09-06: 1 via ORAL
  Filled 2012-09-06: qty 1

## 2012-09-06 MED ORDER — HYDROCODONE 10 MG-ACETAMINOPHEN 325 MG TABLET
1.0000 | ORAL_TABLET | ORAL | Status: DC | PRN
Start: 2012-09-06 — End: 2012-09-09
  Administered 2012-09-06 – 2012-09-09 (×10): 2 via ORAL
  Administered 2012-09-09: 1 via ORAL
  Filled 2012-09-06 (×3): qty 2
  Filled 2012-09-06: qty 1
  Filled 2012-09-06 (×7): qty 2

## 2012-09-06 MED ORDER — HYDROCODONE 10 MG-ACETAMINOPHEN 325 MG TABLET
1.0000 | ORAL_TABLET | ORAL | Status: DC | PRN
Start: 2012-09-06 — End: 2012-09-06

## 2012-09-06 MED ORDER — DIPHENHYDRAMINE 25 MG CAPSULE
50.0000 mg | ORAL_CAPSULE | Freq: Once | ORAL | Status: AC
Start: 2012-09-06 — End: 2012-09-06
  Administered 2012-09-06: 50 mg via ORAL
  Filled 2012-09-06: qty 2

## 2012-09-06 NOTE — Nurse Focus (Signed)
Notified Ortho-Trauma MD, Adriana Simas, with regards to patient being tachycardiac and febrile throughout shift. Most recent lactic acid of 2.1 drawn at 0500. MD states will order stat ABG and UA/Cx and to notify primary team during rounds. @0620  Notified primary Ortho Team MD, Mo, with regards to most recent lactic acid of 2.1 and that patient continues to be tachycardiac and febrile. MD aware.     Imelda Pillow Brenson Hartman, RN

## 2012-09-06 NOTE — Allied Health Consult (Signed)
PM & R -- ACUTE CARE SERVICE      PHYSICAL THERAPY EVALUATION     Name: [name]   MRN: [id number]   Date of Service: 09/06/2012  Time In: 1315    Total Time: 30 Minutes                                                                                                                                                  INTAKE INFORMATION AND HISTORY:                       Therapy Consult(s) Ordered: Physical Therapy and Occupational Therapy  Primary Service: Orthopedics   Date of Admission: 09/05/2012  Date of Onset (Medicare only):   09/05/2012  Diagnosis: s/p left THA  Language: English    Precautions: Fall precautions, Posterior hip precautions and WBAT LLE    History of Present Illness/Injury (Including pertinent test results & procedures):    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    Past Medical/Surgical History:    Past Medical History:    Chronic ischemic heart disease, unspecified     1995            Comment:s/p MI and multiple stents    Esophageal reflux                               04/28/2007     Barrett's esophagus                             04/28/2007     Benign neoplasm of colon                        04/28/2007     Gout, unspecified                               04/28/2007     Essential hypertension, benign                                Other and unspecified hyperlipidemia                          Cardiac arrest                                  1996            Comment:ischemic related     Acute myocardial infarction of other inferior * 1996  Appendicitis, acute                             07/02/2009     Angina                                          10/31/2009     Shortness of breath                                           Coronary artery disease                                       Arthritis                                                     Psychiatric illness                                           Diabetes type 2, controlled                     03/17/2012     EXTRINSIC ASTHMA  UNSPECIFIED                    05/11/2012      Balance problem                                               COPD (chronic obstructive pulmonary disease)                Past Surgical History:    INSERT INTRACORONARY STENT                                       Comment:Stent placement x 25 by report    EGD                                              2006          COLONOSCOPY                                      3/08          REMOVAL OF TONSILS,<11 Y/O                                       Comment:Tonsillectomy-childhood    APPENDECTOMY  2010          COLONOSCOPY,DIAGNOSTIC                           08/10/2010      Comment:Colonoscopy    UPPER GI ENDOSCOPY,BIOPSY                        06/17/2011       Comment:     COLONOSCOPY,DIAGNOSTIC                           08/24/2012      Comment:Colonoscopy    COLONOSCOPY,BIOPSY                               08/24/2012      Comment:     TOTAL HIP ARTHROPLASTY                           09/05/2012       Comment:     Social History:    History   Substance Use Topics    Smoking status: Former Smoker -- 2.00 packs/day for 10 years     Types: Cigarettes    Smokeless tobacco: Never Used    Comment: quit at age 37    Alcohol Use: Yes      chardonnay mostly; 1-2 bottles of wine per day on average      SUBJECTIVE EXAM:    Current Living Situation: With family/friends  Support at Time of Discharge:  Family/wife  Environment at Discharge:  Single level home  Environmental Barriers:  Threshold only to enter home and Stall shower  Assistive Devices Owned: front-wheeled walker  raised toilet seat  shower seat  Adaptive Equipment Owned: Long-handled reacher  Prior Level of Function: Ambulating independently without use of  Assistive device   Mental Status: Alert and oriented x4    Pain Level / Chief Complaint: 7/10 pain left hip    OBJECTIVE EXAMINATION:    Observation: male patient supine with IV, PCA, Foley catheter, ALP, KI     Extremity Status: WFL Except         ROM MMT Sensation Tone Coordination   RUE        LUE        RLE        LLE Hip flexion 40 supine, but funCtional at EOB Hip 2/5      Comments: n/a     Demonstrated Functional Activities:   Key: Dep=Dependent     Max=Maximal     Mod=Moderate     Min=Minimal     CG=Contact Guard     SB=Stand-By     S=Supervised     I=Independent     NA=Not Applicable     NT=Not Tested     N=Normal     G=Good     F=Fair     P=Poor     U=Unable     FWW=Front-Wheeled Walker     AC=Axillary Crutches     SPC=Single-Point Cane     Level of Assistance Dep Max Mod Min CG SB S I NA NT Comments   Rolling            x    Scooting     x  Sidelying/ Supine to Sit   x           Transfer Sit to Stand    x       From raised EOB   Transfer Bed to Chair          x    Gait            x    Up/Down Step/Stairs          x    Comments: patient was able to take few side steps  Diaphoretic and c/o pain when at EOB  HR 110-120'S and spO2 on RA high 904-97%  Left in bed with KI AND ALL WITHIN REACH    Balance:       N G F P U NA NT Comments   Sitting Balance - Static  x         Sitting Balance - Dynamic   x        Standing Balance - Static   x        Standing Balance - Dynamic   x        Comments: n/a       ASSESSMENT / RECOMMENDATIONS/ EDUCATION:    Physical Therapy Assessment and Discharge Recommendations:  Patient admitted for left THA.  Patient has pain, decreased ROM and strength in left LE following surgery.  Patient would benefit from physical therapy to restore above mentioned deficits with precautions.  Patient wishes to d/c home.  DME recommendation front wheel walker ( in place)    Patient / Caregiver Education Today: precautions, plans and goals and therapeutic exercises    Method of Teaching: demonstration and verbal    Learner: patient    Response: verbalizes understanding and able to give return demonstration    GOALS / TREATMENT PLAN / FUNCTIONAL PROGNOSIS:  Patient's Goals: going home    Physical Therapy Goals:    Bed Mobility:  independent   Transfers: independent with front wheel walker   Gait: independent with front wheel walker 168ft  Stair Gait: independent with front wheel walker on curb step  Home Exercise Program: independent   Patient Education: verbalizing and demonstrating good understanding of precautions   Caregiver Education: assistance PRN     Prognosis:  Good      Treatment Plan:    Bed Mobility Training  Stage manager Exercise  Patient Education  Caregiver Education / Training  Discharge Planning     Recommended Frequency of Treatment: Twice a day     Recommended Duration of Treatment: 3-4 days     Patient / Caregiver Participation / Education   Has the plan of care been explained to the patient / caregiver? yes   Is the patient able to understand the plan of care?  yes    Does the patient / caregiver(s) agree with the plan of care? yes    List Barriers that may interfere with plan of care:   Pain and Anxiety    Interim Report Due:  09/20/2012     Patient seen for additional physical therapy treatment; see 09/06/2012 physical therapy progress note for details.    x   No additional treatment rendered this encounter.     Reported by:  Ivonne Andrew  PI# [phone]  Vocera [phone]

## 2012-09-06 NOTE — Allied Health Progress (Signed)
COPD Case Management Evaluation    EMR screening tool has flagged this pt for benefit of COPD Case Management services. This pt is not a candidate for these services at this time as they have not been admitted for an Acute COPD Exacerbation. COPD Case Manager will review respiratory medications used to treat COPD and make recommendations to primary team as appropriate.    Thank you,  Alex Chayce Robbins, RCP,RRT  COPD Case Manager  [phone] (COPD)

## 2012-09-06 NOTE — Nurse Focus (Signed)
800 & 900 meds given on time (900).  Rover timed out & I didn't check to see if med admin had been accepted. Meds charted on Rover at 1202 at pt bedside & pt band rescanned. --------------M. Dorann Ou, BSN [facility]

## 2012-09-06 NOTE — Nurse Assessment (Signed)
ASSESSMENT NOTE    Note Started: 09/06/2012, 08:15     Initial assessment completed and recorded in EMR.  Report received from night shift nurse and orders reviewed. Plan of Care for safety, mobility and pain control reviewed and appropriate, discussed with patient. Call light within reach, encouraged use of I.S. Foley intact and teaching on PCA proper use reinforced. Sheran Luz, RN RN

## 2012-09-06 NOTE — Progress Notes (Addendum)
Orthopaedic Progress Note  Date: 09/06/2012  Unit: D14 ORTHOPEDICS/TRAUMA  Attending of Record: Treatment Team: Attending Provider: Paulette Blanch, MD    Procedures: L THA Alan Mulder, 09/05/12)    Subjective:  Patient doing well, pain controlled, denies Chest pain, SOB     Objective:  Temp src:  [-]   Temp:  [36.5 C (97.7 F)-39.4 C (103 F)]   Pulse:  [92-125]   BP: (102-125)/(65-75)   Resp:  [14-20]   SpO2:  [94 %-100 %]   Intake/Output:   Weight: 111.9 kg (246 lb 11.1 oz) (09/05/12 0709)     Last 24hr I/O's: In: 7856.8 [Oral:1840; Crystalloid:5765.8; Colloid:250; Blood:1]  Out: 2045 [Urine:1345; Other:700]      PE:  Gen: NAD, comfortable  Left lower extremity:  Wound: dressing c/d/i without strikethrough  Vascular:  toes warm and well perfused, capillary refill < 2s  Motor function: intact DF/PF/EHL  Sensory: S/S/SP/DP/T intact to light touch    Laboratory Tests:  Lab Results   Lab Name Value Date/Time    WBC 6.8 09/06/2012  1:31 AM    HGB 11.4* 09/06/2012  1:31 AM    HCT 32.7* 09/06/2012  1:31 AM    PLT 134 09/06/2012  1:31 AM     Lab Results   Lab Name Value Date/Time    NA 132* 09/06/2012  1:31 AM    K 3.5 09/06/2012  1:31 AM    CL 101 09/06/2012  1:31 AM    CO2 24 09/06/2012  1:31 AM    BUN 12 09/06/2012  1:31 AM    CR 1.03 09/06/2012  1:31 AM    GLU 164* 09/06/2012  1:31 AM     INR   No results found for this basename: INR:* in the last 24 hours  PTT   No components found with this basename: IPTT:*    Assesment and Plan:  [name] is a 65yr male now POD #1  from L THA    - PT/OT  - Pain control, PCA  - DVT prophylaxis: fragmin and SCDs  - Weight bearing status: WBAT LLE, posterior hip precautions  - Antibiotics: 24hr postop prophylaxis (Vanc, Clinda) to be completed today   - Postop XRs ordered   - Dispo planning  - Patient persistently tachy overnight asymptomatic. ABG drawn, will consult medicine given extensive history of cardiac disease.       Ardine Bjork, MD   PGY 3 Ortho  [phone]  [phone]          This patient  was seen by the resident.  I reviewed and agree with the resident's assessment and plan as outlined in the resident's note.  Report electronically signed by Paulette Blanch, MD. Attending

## 2012-09-06 NOTE — Nurse Focus (Signed)
Notified on-call Ortho MD, Mo, with regards to patient being tachycardiac since beginning of shift. HR has been 120s-125. Patient denies SOB or chest pain. Also notified with regards to patient running fever of 103 F at 0100 with temperature going down to 101.6 F at 0150 after 650 mg of Tyelnolol given. Also notified with regards to SIRS alert triggering; lactic acid drawn = 2.2. No new orders received. Will continue to monitor patient and will do vital signs q2hrs x 2 and redraw the lactic acid in 4 hours per SIRs protocol.     Also notified MD with regards to Allopurinol scheduled @ 2100 on 12/3; MD states okay to hold given medication is supposed to be given with meals.     Imelda Pillow Blessen Kimbrough, RN

## 2012-09-06 NOTE — Progress Notes (Signed)
Patient was seen bedside, post surgery for THA- post surgery day one.     Patient was alert, responsive and indicate he is in pain, but it is tolerable at this time. Indicates needs are being taken care of by nursing staff. Indicates he is discharging to home, with the help of family for support. Patient indicates he is feeling fine.     P: Informed Patient he may receive of post discharge check in from me, but if he has concerns regarding healing or pain, he needs to call into the department for assistance.     Tomasita Crumble, DBH, LCSW

## 2012-09-06 NOTE — Allied Health Progress (Signed)
Physical Therapy Progress Note    Date of Service: 09/06/2012  Time in: 1500  Total Time: 23    S: 5 /10 Pain, located in L hip    O:       Therapeutic Exercises - pt seen for isometric quad sets 10x1, AA/ROM of L HIP 10x1,glut sets 10x1 and ankle pumps 25x1    A: pt gave independent  Demonstration of    personal exercise program.    P: Ambulation.    Cherie Ouch, PTA  PI # [id number]  Vocera:  [phone]

## 2012-09-06 NOTE — Nurse Focus (Signed)
@  [email or web address].Shutter MD at bedside assessing patient. MD states will write orders for one time Benadryl.    Imelda Pillow Tayson Schnelle, RN

## 2012-09-06 NOTE — Progress Notes (Addendum)
Called to assess patient by nurse, patient resting comfortably in bed. Just complaining of feeling warm. NAD. BP normal, Tachy to 120s.  No chest pain, no SOB. Slight rash on patient face.     Will start benadryl   Will start oral pain medication  Will monitor over night    Patient POD 0 does not have active infection.    Ardine Bjork, MD   PGY 3 Ortho  [phone]  [phone]    This patient was seen by the resident.  I reviewed and agree with the resident's assessment and plan as outlined in the resident's note.  Report electronically signed by Paulette Blanch, MD. Attending

## 2012-09-06 NOTE — Nurse Assessment (Signed)
ASSESSMENT NOTE    Note Started: 09/06/2012, 06:27     For 12/3: Initial assessment completed and recorded in EMR.  Report received from day shift nurse and orders reviewed. Plan of Care reviewed and appropriate, discussed with patient and family's.  Imelda Pillow Cordaryl Decelles, RN RN

## 2012-09-06 NOTE — Consults (Addendum)
INTERNAL MEDICINE CONSULT  Date of Admission:   09/05/2012  6:18 AM Date of Service: 09/06/2012     Name of Requesting Attending: Orthopedics         REASON FOR CONSULTATION:  Fever; tachycardia    HISTORY OF PRESENT ILLNESS:    This is a 61 y/o M with h/o CAD s/p 25 stents and CABGx6, HTN, HLD, post op atrial fibrillation in the past who now admitted to the orthopedic service for hip replacement (POD #1), complicated by high fevers overnight. She also continued to have sinus tachycardia overnight in the 110-120's. Patient spiked a fever of 103 overnight. Lactic acid was checked which was only minimum y elevated at 2.1. Rest of his vitals were normal. This am he denies have chills, or congestion. No sore throat. Cough is chronic and unchanged. He does not have a rash or pruritis. No diarrhea.     Blood cultures are pending. UA was not indicative of infection. No CXR obtained given lack of symptoms. IM consulted for fevers and tachycardia.    ROS:  All other systems negative except as noted in the HPI.    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     Acute myocardial infarction of other inferior wall, episode of care unspecified 1996    Appendicitis, acute 07/02/2009    Angina 10/31/2009    Shortness of breath     Coronary artery disease     Arthritis     Psychiatric illness     Diabetes type 2, controlled 03/17/2012    EXTRINSIC ASTHMA UNSPECIFIED 05/11/2012    Balance problem     COPD (chronic obstructive pulmonary disease)     Past Surgical History   Procedure Laterality Date    Insert intracoronary stent       Stent placement x 25 by report    Egd  2006    Colonoscopy   3/08    Removal of tonsils,<11 y/o       Tonsillectomy-childhood    Appendectomy  2010    Colonoscopy,diagnostic  08/10/2010     Colonoscopy    Upper gi endoscopy,biopsy  06/17/2011          Colonoscopy,diagnostic  08/24/2012     Colonoscopy    Colonoscopy,biopsy  08/24/2012          Total hip arthroplasty  09/05/2012            Social History     Occupational History    Not on file.     Social History Main Topics    Smoking status: Former Smoker -- 2.00 packs/day for 10 years     Types: Cigarettes    Smokeless tobacco: Never Used    Comment: quit at age 12    Alcohol Use: Yes      chardonnay mostly; 1-2 bottles of wine per day on average     Drug Use: No    Sexually Active: Yes -- Male partner(s)    Family History   Problem Relation Age of Onset    Hypertension Brother  Cancer Mother 38     colon, 60's        Immunization History   Administered Date(s) Administered    Influenza Vaccine (Fluarix) 07/28/2007    Influenza Vaccine (Fluzone/fluarix Prefill Syringe) 07/15/2010, 07/14/2011    Influenza Vaccine, H1n1, Preservative 09/18/2008    Influenza Vaccine, Quadrivalent (Fluzone) 07/19/2012    PPD - Purified Protein Derivative 08/19/2008    Pneumococcal vaccine, Polysaccharide (Pneumovax) 10/04/2002, 12/25/2009    Tdap (Adacel ) 12/25/2009    Tetanus Diptheria (Td - Adult) 04/28/2007     The patient's past medical, family,social and immunization histories were reviewed and confirmed.    Allergies:    Keflex (Cephalexin)    Rash    Prior to Admission Medications:    Prior to Admission Medications   Medication Last Dose Informant Patient Reported? Taking?   Albuterol (PROAIR HFA) 90 mcg/actuation inhaler "many months ago"  No No   Take 1-2 puffs by inhalation every 4 hours if needed for wheezing (or shortness of breath).   Allopurinol (ZYLOPRIM) 300 mg Tablet 09/04/2012 at 1930  No Yes   Take 1 tablet by mouth every day.   Aspirin 325 mg Tablet Tablet 09/01/12  Yes No   Atenolol (TENORMIN) 100  mg Tablet 09/05/2012 at 045  Yes Yes   Take 1 tablet by mouth 2 times daily. ( blood pressure )   CENTRUM SILVER TAB 11/29  Yes No   take 1 tablet by oral route once daily   Cholecalciferol, Vitamin D3, (VITAMIN D) 1,000 unit Tablet 11/29  Yes No   Take 5 tablets by mouth every day.   CO-ENZYME Q-10 100 mg cap   Yes No   Take 200 mg by mouth every day.   Enoxaparin (LOVENOX) 40 mg/0.4 mL Syringe post op  No No   Inject 40 mg subcutaneously every 24 hours.   Ezetimibe 10 mg/Simvastatin 40 mg (VYTORIN) 10-40 mg Tablet   No No   Take 1 tablet by mouth every evening.   Ezetimibe 10 mg/Simvastatin 80 mg (VYTORIN) 10-80 mg per tablet 09/04/2012 at 1900  No Yes   Take 1 tablet by mouth every day.   Fenofibric Acid (TRILIPIX) 135 mg CpDR 09/04/2012 at 1900  No Yes   Take 1 capsule by mouth every day.   Fluticasone (FLONASE) 50 mcg/actuation nasal spray 09/05/2012 at 0400  No Yes   Instill 2 sprays into EACH nostril every day. As needed.   Fluticasone/Salmeterol (ADVAIR DISKUS) 250-50 mcg/dose DsDv Inhaler 09/05/2012 at 0500  No Yes   Take 1 puff by inhalation 2 times daily.   Indomethacin (INDOCIN) 50 mg Capsule   Yes No   Lisinopril-Hydrochlorothiazide (PRINZIDE, ZESTORETIC) 20-25 mg per tablet 09/04/2012 at 0800  No Yes   Take 1 tablet by mouth every morning.   Lorazepam (ATIVAN) 0.5 mg Tablet 09/05/2012 at 0330  No Yes   Take 1 tablet by mouth 3 times daily. and 2 tabs at bedtime   Magnesium Oxide (MAG-OX 400) 400 mg Tablet 11/29  Yes Yes   Take 400 mg by mouth every day.   Montelukast (SINGULAIR) 10 mg Tablet   No No   Take 1 tablet by mouth every evening. take in the evening   Nitroglycerin (NITROSTAT) 0.4 mg Sublingual Tablet "years ago"  No No   Dissolve 1 tablet under the tongue every 5 minutes if needed for chest pain. If pain persists after a total of 3 tablets call 911.   Pantoprazole (PROTONIX) 40 mg Delayed  Release Tablet 09/05/2012 at 0515  No Yes   Take 1 tablet by mouth 2 times daily.   PATADAY 0.2 % Drops months  ago  No No   INSTILL 1 DROP INTO EACH EYE EVERY DAY.   Sildenafil (VIAGRA) 100 mg Tablet months ago  No No   Take 1 tablet by mouth if needed. Prior to sexual activity   VITAMIN B-12 1,000 MCG TAB 11/29  Yes No   once daily   VITAMIN C PO 11/29  Yes No   400 mg daily        Current Medications:    Scheduled Medications     Allopurinol (ZYLOPRIM) Tablet 300 mg, ORAL, QAM PC  Atenolol (TENORMIN) Tablet 100 mg, ORAL, BID  Calcium-Cholecalciferol (D3) (OSCAL 500+D) Tablet 1 tablet, ORAL, BID  Dalteparin (FRAGMIN) Injection 5,000 Units, SUBCUTANEOUS, Daily 1000  Docusate (COLACE) Capsule 100 mg, ORAL, BID  Ezetimibe 10 mg/Simvastatin 80 mg (VYTORIN) Tablet 1 tablet, ORAL, QAM  Ferrous Sulfate EC Tablet 324 mg, ORAL, BID  Fluticasone/Salmeterol (ADVAIR DISKUS) 250-50 mcg/dose Inhaler 1 puff, INHALATION, BID  Hydrochlorothiazide (HYDRO-DIURIL) Tablet 25 mg, ORAL, QAM  Lisinopril (PRINIVIL, ZESTRIL) Tablet 20 mg, ORAL, QAM  Lorazepam (ATIVAN) Tablet 0.5 mg, ORAL, TID  Montelukast (SINGULAIR) Tablet 10 mg, ORAL, Daily 2100  Multivitamin with Minerals Tablet 1 tablet, ORAL, QAM  Pantoprazole (PROTONIX) Delayed Release Tablet 40 mg, ORAL, BID  Sennosides (SENOKOT) Tablet 17.2 mg, ORAL, Daily Bedtime    IV Medications  Hydromorphone, , IV, PCA  NaCl 0.9%, , IV, CONTINUOUS, Last Rate: 100 mL/hr at 09/06/12 1312    PRN Medications     Acetaminophen (TYLENOL) Tablet 650 mg, ORAL, Q4H PRN  Albuterol (PROAIR HFA, PROVENTIL HFA, VENTOLIN HFA) Inhaler 1-2 puff, INHALATION, Q4H PRN  Bisacodyl (DULCOLAX) Suppository 10 mg, RECTALLY, Q12H PRN  Fluticasone (FLONASE) Nasal Spray 2 spray, EACH Nostril, BID prn  Hydrocodone 10 mg/Acetaminophen 325 mg (NORCO 10) Tablet 1-2 tablet, ORAL, Q4H PRN  Hydromorphone (DILAUDID) Injection 0.2-1 mg, IV, Q2H PRN  Magnesium Hydroxide (MILK OF MAGNESIA) 400 mg/5 mL Suspension 30 mL, ORAL, Q12H PRN  Naloxone (NARCAN) Injection 0.1 mg, IV, PRN  Ondansetron (ZOFRAN) Injection 4 mg, IV, Q8H PRN  Oxycodone  (ROXICODONE) Tablet 5-10 mg, ORAL, Q4H PRN    VITAL SIGNS:  Summary  Temp Min: 37 C (98.6 F) Max: 39.4 C (103 F)  BP: (98-119)/(62-75)   Pulse Min: 102  Max: 125   Resp Min: 18  Max: 20   SpO2 Min: 91 % Max: 95 %      Current Vitals  Temp: 37 C (98.6 F)  BP: 98/62 mmHg  Pulse: 102   Resp: 18   SpO2: 95 %  Flow (L/Min)(Oxygen Therapy): 10    Weight: 111.9 kg (246 lb 11.1 oz)  Body surface area is 2.35 meters squared.  Body mass index is 35.4 kg/(m^2).    Intake and Output  Last Two Completed Shifts  In: 5194 [Oral:2530; Crystalloid:2664]  Out: 1960 [Urine:1960]    Current Shift       PHYSICAL EXAM:  General Appearance: healthy, alert, no distress, pleasant affect, cooperative.   Eyes: not examined.   Ears: not examined.   Nose: not examined .  Mouth: normal.   Neck: Neck supple. No adenopathy, thyroid symmetric, normal size.   Heart: normal rate and regular rhythm, no murmurs, clicks, or gallops.   Lungs: clear to auscultation.   ]Abdomen: BS normal.  Abdomen soft, non-tender.  No masses or organomegaly.  All 4 Extremities: no cyanosis, clubbing, or edema.   Skin: Skin color, texture, turgor normal. No rashes or lesions.   Mental Status: Appearance/Cooperation: oriented times 3.    LAB TESTS/STUDIES:  Lab Results   Lab Name Value Date/Time    WBC 6.8 09/06/2012  1:31 AM    HGB 11.4* 09/06/2012  1:31 AM    HCT 32.7* 09/06/2012  1:31 AM    PLT 134 09/06/2012  1:31 AM     BASIC METABOLIC PANEL Recent labs for the past 24 hours     09/06/12 0131    GLUCOSE 164*    UREA NITROGEN, BLOOD (BUN) 12    CREATININE BLOOD 1.03    SODIUM 132*    POTASSIUM 3.5    CHLORIDE 101    CARBON DIOXIDE TOTAL 24    CALCIUM 8.0*     Lab Results   Lab Name Value Date/Time    AST 101* 08/22/2012 11:31 AM    ALT 59 08/22/2012 11:31 AM    ALP 53 08/22/2012 11:31 AM    ALB 3.7 08/22/2012 11:31 AM    TP 6.6 08/22/2012 11:31 AM    TBIL 0.9 08/22/2012 11:31 AM     URINALYSIS - CHEM ONLY Recent labs for the past 24 hours      09/06/12 0609    COLLECTION --    COLOR Yellow    CLARITY Clear    PH URINE 6.0    OCCULT BLOOD URINE SMALL*    BILIRUBIN URINE Negative    KETONES Trace*    GLUCOSE URINE Negative    PROTEIN URINE Trace    UROBILINOGEN. Negative    NITRITE URINE Negative    LEUK. ESTERASE Negative        ASSESSMENT AND RECOMMENDATION:  This is a 61 y/o M with h/o CAD s/p 25 stents and CABGx6, HTN, HLD, post op atrial fibrillation in the past who now admitted to the orthopedic service for hip replacement (POD #1), complicated by high fevers overnight and tachycardia for which IM was consulted.     Fevers/Tachycardia: sinus tachycardia confirmed by EKG. Has not received albuterol. Likely 2/2 to underlying disease. Etiology may be infection given fevers. Patient seems to be euvolemic. Anxiety may also play a role as patient states that he is very anxious and usually has high HR after surgeries. Pain is well controlled as per patient so may not explain the tachycardia. Given fevers and persistent tachycardia must r/o infection. Pulmonary embolism should also be considered especially if tachycardia continues, which may also explain the fevers. No sign of Gout flare.   -Consider blood cultures, urine cultures  -If fever recurs; consider CXR as well.   -would hold off on empiric antibiotics-given lack of source, leucocytosis, and currently hemodynamically stable.  -If no resolution of fever and/or tachycardia; Would consider duplex of lower extremities    Lattie Corns, MD Resident  Pager #: [phone]         ATTENDING ADDENDUM:  This patient was seen, evaluated, and care plan was developed with the resident.  I agree with the assessment and plan as outlined in the resident's note.      Pt states he has not been very mobile for months.  Has not noted leg swelling but might warrant lower extremity dopplers to evaluate risk for PE if no infectious source is obvious.  Encourage incentive spirometry.    Report electronically signed by  Girtha Rm, MD. Attending

## 2012-09-07 MED ORDER — NACL 0.9% IV INFUSION
INTRAVENOUS | Status: AC
Start: 2012-09-07 — End: 2012-09-08
  Administered 2012-09-07 – 2012-09-08 (×3): via INTRAVENOUS

## 2012-09-07 MED ORDER — POTASSIUM CHLORIDE ER 20 MEQ TABLET,EXTENDED RELEASE(PART/CRYST)
60.0000 meq | EXTENDED_RELEASE_TABLET | Freq: Once | ORAL | Status: AC
Start: 2012-09-07 — End: 2012-09-07
  Administered 2012-09-07: 60 meq via ORAL
  Filled 2012-09-07: qty 3

## 2012-09-07 MED ORDER — NACL 0.9% IV BOLUS - DURATION REQ
1000.0000 mL | Freq: Once | INTRAVENOUS | Status: AC
Start: 2012-09-07 — End: 2012-09-07
  Administered 2012-09-07: 1000 mL via INTRAVENOUS

## 2012-09-07 NOTE — Progress Notes (Signed)
Orthopaedic Progress Note  Date: 09/07/2012  Unit: D14 ORTHOPEDICS/TRAUMA  Attending of Record: Treatment Team: Attending Provider: Paulette Blanch, MD    Procedures: L THA Alan Mulder, 09/05/12)    Subjective:  Patient doing well, pain controlled, denies Chest pain, SOB     Objective:  Temp src:  [-]   Temp:  [36.7 C (98 F)-37.9 C (100.2 F)]   Pulse:  [100-120]   BP: (98-115)/(60-70)   Resp:  [18-20]   SpO2:  [91 %-98 %]   Intake/Output:   Weight: 111.9 kg (246 lb 11.1 oz) (09/05/12 0709)     Last 24hr I/O's: In: 3900 [Oral:1320; Crystalloid:2580]  Out: 1410 [Urine:1410]      PE:  Gen: NAD, comfortable  Left lower extremity:  Wound: dressing c/d/i without strikethrough  Vascular:  toes warm and well perfused, capillary refill < 2s  Motor function: intact DF/PF/EHL  Sensory: S/S/SP/DP/T intact to light touch    Laboratory Tests:  Lab Results   Lab Name Value Date/Time    WBC 10.1 09/07/2012  4:09 AM    HGB 10.8* 09/07/2012  4:09 AM    HCT 30.8* 09/07/2012  4:09 AM    PLT 116* 09/07/2012  4:09 AM     Lab Results   Lab Name Value Date/Time    NA 129* 09/07/2012  4:09 AM    K 3.1* 09/07/2012  4:09 AM    CL 99 09/07/2012  4:09 AM    CO2 24 09/07/2012  4:09 AM    BUN 12 09/07/2012  4:09 AM    CR 1.12 09/07/2012  4:09 AM    GLU 163* 09/07/2012  4:09 AM     INR   No results found for this basename: INR:* in the last 24 hours  PTT   No components found with this basename: IPTT:*    Assesment and Plan:  [name] is a 60yr male now POD #2 from L THA. Tachycardia improved, no temp over 38 for 24 hours, UCx still pending. Continue to monitor    - PT/OT  - Pain control, dc PCA today  - DVT prophylaxis: fragmin and SCDs  - Weight bearing status: WBAT LLE, posterior hip precautions  - Antibiotics: 24hr postop prophylaxis (Vanc, Clinda) completed  - Postop XRs ordered   - Dispo planning  - Appreciate Med Consult recs      Epifanio Lesches, MD  Adult Reconstruction  Orthopaedic Surgery  PI # [id number]

## 2012-09-07 NOTE — Plan of Care (Signed)
Problem: Hip Replacement, Total (Adult)  Goal: Prevent/Manage Potential Problems (Hip Replacement, Total (Adult))  Outcome: Signs and symptoms of listed potential problems will be absent or manageable (reference CPG)   Outcome: Progressing  Foley reinserted. Pt with generalized weakness and unsteady gait.     Problem: Fall/Trauma/Injury Risk (Adult, Obstetrics)  Goal: Absence Of Trauma/Injury/Falls (Fall/Trauma/Injury Risk)  Patient will demonstrate the desired outcomes.   Outcome: Progressing  Continues to be hall risk due to fatigue and weakness

## 2012-09-07 NOTE — Allied Health Progress (Signed)
PM& R -- ACUTE CARE SERVICE      OCCUPATIONAL THERAPY EVALUATION    Name: [name]  MRN: [id number]   Date of Service: 09/07/2012   Time In: 11:00   Total Time: 30 Minutes                                                                                                                                   INTAKE INFORMATION AND HISTORY:                                 Therapy Consult(s) Ordered: Physical Therapy and Occupational Therapy       Primary Service: Orthopedics       Date of Admission: 09/05/2012        Date of Onset (Medicare only): 09/05/12       Diagnosis:L hip DJD; s/p left THA       Precautions:   Fall precautions, Posterior hip precautions and WBAT LLE       Language: English         History of Present Illness/Injury (Including pertinent test results & procedures): *Copied from MD's Progress Note*  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         Past Medical/Surgical History:   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     Acute myocardial infarction of other inferior wall, episode of care unspecified 1996    Appendicitis, acute 07/02/2009    Angina 10/31/2009    Shortness of breath     Coronary artery disease     Arthritis     Psychiatric illness     Diabetes type 2, controlled 03/17/2012    EXTRINSIC ASTHMA UNSPECIFIED 05/11/2012    Balance problem     COPD (chronic obstructive pulmonary disease)      Past Surgical History   Procedure Laterality Date    Insert intracoronary stent       Stent placement x 25 by report    Egd  2006    Colonoscopy  3/08    Removal of tonsils,<11 y/o       Tonsillectomy-childhood    Appendectomy  2010     Colonoscopy,diagnostic  08/10/2010     Colonoscopy    Upper gi endoscopy,biopsy  06/17/2011          Colonoscopy,diagnostic  08/24/2012     Colonoscopy    Colonoscopy,biopsy  08/24/2012  Total hip arthroplasty  09/05/2012                   Social History:   History     Social History    Marital Status: MARRIED     Spouse Name: N/A     Number of Children: N/A    Years of Education: N/A     Occupational History    Not on file.     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                  SUBJECTIVE EXAM:       Current Living Situation: With family/friends       Support at Time of Discharge: Wife       Environment at Discharge: Single level home       Environmental Barriers:  Threshold only to enter home and Stall shower       Assistive Devices Owned:   front-wheeled walker  raised toilet seat  shower seat       Adaptive Equipment Owned: Pt purchased the Hip Kit       Prior Level of Function:   Ambulating independently without assistive device  Independent with all ADLS       Mental Status: Alert and oriented x4       Pain Level / Chief Complaint: 3-4  /10  Left Hip    OBJECTIVE EXAMINATION:       Observation: 61 y/o male sitting in cardiac chair with left hip dressing. Pleasant and cooperative         Hand Dominance: Right    Extremity Status: WNL Except      ROM MMT Sensation Tone Coordination   RUE          LUE          RLE          LLE   Hip and knee flexion limited 3-/5 hip and knee flexion   Limited by post-op pain and  stiffness   Other:  N/A        Activities of Daily Living:        Key: Dep=Dependent     Max=Maximal     Mod=Moderate     Min=Minimal     CG=Contact Guard     SB=Stand-By     S=Supervised     I=Independent     NA=Not Applicable     NT=Not Tested     N=Normal     G=Good     F=Fair     P=Poor     U=Unable     FWW=Front-Wheeled Walker     AC=Axillary Crutches     SPC=Single-Point Cane       Level of Assistance: Dep Max Mod Min CG  SB S I NA NT Comments   Bed Mobility (self-adjust)    x       Needs assistance with LLE management   Sidelying To Sit / Supine To Sit    x       Needs assistance with LLE management   Transfer Bed To Chair    x x      With cues for FWW and safety   Transfer Bed To Toilet    x x      With cues for FWW and safety   Transfer To Tub / Shower          x    Grooming / Hygiene     x      Standing level   Toileting   x           Bathing Upper          x Wife will assist PRN   Bathing Lower          x Wife will assist PRN   Upper Body Dress       x    Set-up   Lower Body Dress x             Self-feeding        x          Cognitive / Visual Perceptual:         Intact Impaired NT Comments   Orientation x      Memory x      Attention x      Following Directions x      Communication x      Problem Solving x      Judgment/ Safety x      Visual Fields x          ASSESSMENT / RECOMMENDATIONS / EDUCATION:           Occupational Therapy Assessment / Discharge Recommendations:    OT eval completed. Pt's functional capacity is limited by general post-op deconditioning and decreased LLE AROM of ADLs and functional mobility. Continue OT to maximize Pt's independence and safety with ADLs and functional mobility. Dispo: anticipate home DME: Appears all DME/AD are in place.         Patient / Caregiver Education Today: WBAT and Posterior Hip precautions on LLE; OT plan of care and goals              Method of Teaching: demonstration and verbal             Learner: patient            Response: partially understands,  needs more practice    GOALS / TREATMENT PLAN / Peter Congo PROGNOSIS:       Patient's Goals: "get moving better"       Occupational Therapy Goals:       Prior To Discharge Patient Will Demonstrate: 2-3 Txs  Hygiene/Grooming: Supervised Sinkside stanidng with FWW x 2 task  Dressing:  Lower Body Set-up/S with AD  Functional transfer to toilet Supervised with FWW and RTS  Functional transfer to Shower Stall: SBA with FWW with good safety  Other: Bed mobility: Modified I  Pericare/toileting: Supervised              Prognosis: Good          Treatment Plan:   Bed mobility Training, Teacher, early years/pre, Functional Mobility, ADL / Self Care, Therapeutic Exercise, Adaptive Equipment, Durable Medical Equipment, Energy Conservation /  Work Simplification, Patient / Sales promotion account executive and Home Exercise Program         Recommended Frequency of Treatment: 1-2 times per day         Recommended Duration of Treatment: anticipate will need 2-3 Txs      Patient / Caregiver Participation / Education   Has the plan of care been explained to the patient / caregiver? yes   Is the patient able to understand the plan of care?                         yes     Does the patient / caregiver(s) agree with the plan of care? yes   List barriers that may interfere with plan of care    None   Comments: N/A             Interim Report Due:  09/21/2012         Patient seen for additional occupational therapy treatment; see 09/07/2012 occupational therapy progress note for details.    x   No additional treatment rendered this encounter.     Reported by:  Simeon Craft III OTR/L III  Department of Physical Medicine & Rehabilitation/ Acute Care Services  Occupational Therapy  Pager Number/ OT scheduling: [phone]  PI # [phone]

## 2012-09-07 NOTE — Nurse Assessment (Signed)
ASSESSMENT NOTE    Note Started: 09/07/2012, 22:13     Initial assessment completed and recorded in EMR.  Report received from day shift nurse and orders reviewed. Plan of Care reviewed and appropriate, discussed with patient.  Ollen Barges RN

## 2012-09-07 NOTE — Allied Health Progress (Signed)
Physical Therapy Progress Note    Date of Service: 09/07/2012  Time in:1430  Total Time: 30    S: 5 /10 Pain, located in L hip.    O: pt seen for BID TX. Full report to F/U.    A: pt has not met goals. Not steady with ambulation and poor bed mobility. MOD A level.    P: Cont goals.    Cherie Ouch, PTA  PI # [phone]  Vocera:  [phone]

## 2012-09-07 NOTE — Allied Health Progress (Signed)
Physical Therapy Progress Note    Date of Service: 09/07/2012  Time in: 1430  Total Time:30  LATE NOTE  S: 5 /10 Pain, located in L hip    O: Bed mobilty - supine to sit mod A of 1 with 2nd person providing mIin A of LLE maintain posterior total hip precautions.     Transfers- sit to stand from high surface CGA with front wheeled walker.      Gait - AMB ~ 43ft with CGA/MIN A.      Balance - pt had x1 anterior listing requiring min A.    A: pt has not met goals. He is currently not safe for his wife to assist him at home. Pt will need heavy assist for bed mobility and will need some one to walk with him at all times.    P: Family training 12/6 with wife.    Cherie Ouch, PTA  PI # [phone]  Vocera:  [phone]

## 2012-09-07 NOTE — Nurse Assessment (Signed)
ASSESSMENT NOTE    Note Started: 09/07/2012, 08:01     Initial assessment completed and recorded in EMR.  Report received from night shift nurse and orders reviewed. Plan of Care for safety, mobility and pain control reviewed and appropriate, discussed with patient. Call light within reach, PCA proper use reinforced. Instructed to use I.S. Every hour. Sheran Luz, RN RN

## 2012-09-07 NOTE — Allied Health Progress (Signed)
Case Management Progress Note    Comments:met with pt at bedside to discuss [facility]- pt discussed with his wife and is requesting [facility] referral with no preference to agency.  [facility] referral sent via [facility] and will follow up for acceptance.        Plan:will follow to complete [facility] referral when med stable.        Date: 09/07/2012 Time: 11:28  D Makhayla Mcmurry, RN, MSN, PHN, CCM [phone], [phone]

## 2012-09-07 NOTE — Allied Health Consult (Signed)
CLINICAL CASE MANAGEMENT ASSESSMENT NOTE    Name: [name]  MRN: [id number]   Date of Birth:Apr 22, 1951 (25yr) Gender:male    Note Date: 09/07/2012 Note Time: 09:19   Date of Service: 09/06/2012 Time of Service: 3:30pm     Patient able to participate in plan? Alert/Oriented ............ yes        Needs Reassessment no  Contact Person/Caregiver if unable to participate  : wife [name] cell [phone]   Lives with: wife   Family/Friends to assist: wife  Funding: Community education officer HMO  [facility]  Primary Care Physician: Dr Felicita Gage  Permanent Address:  [address]  [place] [address]    Discharge Address:  Same as above     Pre-Hospitalization Level of Care: independent   Pre-existing Lines/Drains/Wounds: none  Home Health and/or Resources in Place: none   DME in Place: FWW, ADL aides, shower stool, RTS   Potential Barriers to Discharge: none   Anticipated DC Needs: will benefit from [facility] therapy   Patient/Family offered choice of provider and agreeable with plan/Referrals? yes  Patient/family provided with long-term care community resources:  n/a  Comments: met with pt at bedside, AA&O x3.  Discussed d/c plans and pt is planning on going home with wife assistance.  Discussed HH nursing and therapy- pt not sure if his insurance will cover or the copayments.  Pt prefers to check with his wife and will let us know if he wants HH.  Pt has all needed dme.     Plan/Follow-up needed: will check back with pt regarding HH referral.          Electronically Signed by:    Wallene Huh, RN, MSN, PHN, CCM [phone], [phone]  Date: 09/07/2012,  Time: 09:19

## 2012-09-07 NOTE — Nurse Focus (Signed)
Patient is not voiding yet since removal of urinary catheter, bladder scan only shows 256 and patient been off from him IV fluid since early morning. Encouraged more fluid intake. Medicine Doctor came by during procedure and aware of current bladder issue.

## 2012-09-07 NOTE — Allied Health Progress (Signed)
Physical Therapy Progress Note    Date of Service: 09/07/2012  Time in: 8:15  Total Time: 30    S: 5 to 6 /10 Pain, located in L hip. Pt has wedge for bed at home.     O: Bed mobilty supine to sit MOD A with HOB up.     Transfers - sit to stand MIn A with front wheeled walker. + fatigue with low intensity mobility. Pt rested at edge of bed. 2nd sit to stand with FWW/ CGA pivot to eo  cardiac chair.  MIN A during transtion to sit and mngt of L LE.  Gait - pre ambulation. Wt shifiting and shoulder depressions into front wheeled walker.      Therapeutic Exercises - heel slides  And ankle pumps.    A: progressing as tolerated 2/24fatigue. Followed posterior total hip precautions with mod A    P: pt will be up in chair 0ne hr. Transition of care to RN/OT.    Cherie Ouch, PTA  PI # [phone]  Vocera:  [phone]

## 2012-09-07 NOTE — Progress Notes (Addendum)
INTERNAL MEDICINE CONSULT PROGRESS NOTE  Date: 09/07/2012  Time: 13:37        SUMMARY:    This is a 61 y/o M with h/o CAD s/p 25 stents and CABGx6, HTN, HLD, post op atrial fibrillation in the past who now admitted to the orthopedic service for hip replacement (POD #1), complicated by high fevers overnight and tachycardia for which IM was consulted.  (12/04 2158)    INTERVAL HISTORY:   Tm of 100.2  Tachycardia decreased to 100.   UCx pending    SUBJECTIVE:   No complaints. States that he drank on thanksgiving. No history of seizures. States that he has not went to urinate but he also does not feel the need. His pain is well controlled now.    MEDICATIONS:  Scheduled Medications     Allopurinol (ZYLOPRIM) Tablet 300 mg, ORAL, QAM PC  Atenolol (TENORMIN) Tablet 100 mg, ORAL, BID  Calcium-Cholecalciferol (D3) (OSCAL 500+D) Tablet 1 tablet, ORAL, BID  Dalteparin (FRAGMIN) Injection 5,000 Units, SUBCUTANEOUS, Daily 1000  Docusate (COLACE) Capsule 100 mg, ORAL, BID  Ezetimibe 10 mg/Simvastatin 80 mg (VYTORIN) Tablet 1 tablet, ORAL, QAM  Ferrous Sulfate EC Tablet 324 mg, ORAL, BID  Fluticasone/Salmeterol (ADVAIR DISKUS) 250-50 mcg/dose Inhaler 1 puff, INHALATION, BID  Hydrochlorothiazide (HYDRO-DIURIL) Tablet 25 mg, ORAL, QAM  Lisinopril (PRINIVIL, ZESTRIL) Tablet 20 mg, ORAL, QAM  Lorazepam (ATIVAN) Tablet 0.5 mg, ORAL, TID  Montelukast (SINGULAIR) Tablet 10 mg, ORAL, Daily 2100  Multivitamin with Minerals Tablet 1 tablet, ORAL, QAM  Pantoprazole (PROTONIX) Delayed Release Tablet 40 mg, ORAL, BID  Sennosides (SENOKOT) Tablet 17.2 mg, ORAL, Daily Bedtime    IV Medications  NaCl 0.9%, , IV, CONTINUOUS, Last Rate: 100 mL/hr at 09/07/12 0500    PRN Medications     Acetaminophen (TYLENOL) Tablet 650 mg, ORAL, Q4H PRN  Albuterol (PROAIR HFA, PROVENTIL HFA, VENTOLIN HFA) Inhaler 1-2 puff, INHALATION, Q4H PRN  Bisacodyl (DULCOLAX) Suppository 10 mg, RECTALLY, Q12H PRN  Fluticasone (FLONASE) Nasal Spray 2 spray, EACH Nostril, BID  prn  Hydrocodone 10 mg/Acetaminophen 325 mg (NORCO 10) Tablet 1-2 tablet, ORAL, Q4H PRN  Hydromorphone (DILAUDID) Injection 0.2-1 mg, IV, Q2H PRN  Magnesium Hydroxide (MILK OF MAGNESIA) 400 mg/5 mL Suspension 30 mL, ORAL, Q12H PRN  Naloxone (NARCAN) Injection 0.1 mg, IV, PRN  Ondansetron (ZOFRAN) Injection 4 mg, IV, Q8H PRN  Oxycodone (ROXICODONE) Tablet 5-10 mg, ORAL, Q4H PRN    OBJECTIVE:   Vital Signs  Summary  Temp Min: 36.7 C (98 F) Max: 37.4 C (99.3 F)  BP: (97-107)/(60-70)   Pulse Min: 100  Max: 120   Resp Min: 18  Max: 19   SpO2 Min: 94 % Max: 98 %       Current Vitals  Temp: 36.7 C (98 F)  BP: 97/65 mmHg  Pulse: 101   Resp: 18   SpO2: 97 %  Flow (L/Min)(Oxygen Therapy): 1    Weight: 111.9 kg (246 lb 11.1 oz)     Intake and Output  Last Two Completed Shifts  In: 3900 [Oral:1320; Crystalloid:2580]  Out: 1410 [Urine:1410]    Current Shift  In: 780 [Oral:780]  Out: -     Physical Exam  General Appearance: healthy, alert, no distress, pleasant affect, cooperative.   Eyes: not examined.   Ears: not examined.   Nose: not examined .  Mouth: normal.   Neck: Neck supple. No adenopathy, thyroid symmetric, normal size.   Heart: normal rate and regular rhythm, no murmurs, clicks, or gallops.  Lungs: clear to auscultation.   Abdomen: BS normal. Abdomen soft, non-tender. No masses or organomegaly.   All 4 Extremities: no cyanosis, clubbing, or edema. RLE in a bandage.   Skin: Skin color, texture, turgor normal. No rashes or lesions.   Mental Status: Appearance/Cooperation: oriented times 3.    LAB TESTS/STUDIES:  Lab Results   Lab Name Value Date/Time    WBC 10.1 09/07/2012  4:09 AM    HGB 10.8* 09/07/2012  4:09 AM    HCT 30.8* 09/07/2012  4:09 AM    PLT 116* 09/07/2012  4:09 AM       BASIC METABOLIC PANEL Recent labs for the past 24 hours     09/07/12 0409    GLUCOSE 163*    UREA NITROGEN, BLOOD (BUN) 12    CREATININE BLOOD 1.12    SODIUM 129*    POTASSIUM 3.1*    CHLORIDE 99    CARBON  DIOXIDE TOTAL 24    CALCIUM 8.0*        ASSESSMENT AND RECOMMENDATION(S):  This is a 61 y/o M with h/o CAD s/p 25 stents and CABGx6, HTN, HLD, post op atrial fibrillation in the past who now admitted to the orthopedic service for hip replacement (POD #1), complicated by high fevers overnight and tachycardia for which IM was consulted.     Fevers/Tachycardia: sinus tachycardia confirmed by EKG. Has not received albuterol. Likely 2/2 to underlying disease. Etiology may be infection given fevers. Patient seems to be euvolemic. Anxiety may also play a role as patient states that he is very anxious and usually has high HR after surgeries. Pain is well controlled as per patient so may not explain the tachycardia. Given fevers and persistent tachycardia must r/o infection. Pulmonary embolism should also be considered especially if tachycardia continues, which may also explain the fevers. No sign of Gout flare.   -Consider blood cultures, urine cultures, especially since now differential shows left shift.   -If fever recurs; consider CXR as well.   -would hold off on empiric antibiotics-given lack of source, leucocytosis, and currently hemodynamicaly stable  -If no resolution of fever and/or tachycardia; Would consider duplex of lower extremities  -Also consider mild alcohol withdrawal/anxiety, and tachycardia Valium 5 mg x1 to see if tachycardia resolved.     Lattie Corns, MD Resident  Pager #: [phone]     AFTERNOON Addendum:  See above recommendations as well as:    #hyponatremia: despite 2L and 100cc/hr of maintanence fluid, he is likely hypovolemic given large BMI, hypotension and potentially large amount of insensible loses from sweating. His liver function is not poor enough to explain hypervolemic hyponatremic. Because ECHO is normal, 05/2012 his heart can handle a little more fluids. This also may help with the hypotension he currently has. Would recommend a fluid challenge 1L NS bolus followed by 150cc for 15  hours.    - increase fluids (written for)   - BMP, mag in 4 hours   - replace K with of PO K (written for)   - replace Mag and K as needed.    #fever: see above    Luretha Murphy. Shona Simpson, MD  [facility]- Internal Medicine PGY 2  Pager # [phone]  PI# [phone]     ATTENDING ADDENDUM:  This patient was seen, evaluated, and care plan was developed with the resident.  I agree with the assessment and plan as outlined in the resident's note.      Increased suspicion for alcohol withdrawal contributing  to increased temp and tachycardia as pt admits drinking heavily with chardonnay and stopped his last drink last week after Thanksgiving.  LFTs showed mild alcohol hepatitis.  However now his blood pressure is lower and possibly has significant insensible losses from high temp requiring a fan in the room.  He may be more hypovolemic.  Need f/u infectious workup, plus consider PE and lower extremity dopplers may help suggest whether a CT angio is indicated.  Report electronically signed by Girtha Rm, MD. Attending

## 2012-09-07 NOTE — Nurse Focus (Signed)
Patient requesting to keep foley.. Notified Dr. Michele Mcalpine, as per MD RN must follow up with D/C of foley with AM team.

## 2012-09-07 NOTE — Nurse Assessment (Signed)
ASSESSMENT NOTE    Note Started: 09/07/2012, 01:51     Initial assessment completed and recorded in EMR.  Report received from day shift nurse and orders reviewed. Plan of Care reviewed and appropriate, discussed with patient.  Gasper Lloyd, RN

## 2012-09-07 NOTE — Nurse Focus (Signed)
Pt up in room with FWW and 1 person assist. Pt needing lots of cuing and shaky and unsteady. Ollen Barges, RN

## 2012-09-08 ENCOUNTER — Telehealth: Payer: Self-pay

## 2012-09-08 MED ORDER — DIAZEPAM 5 MG TABLET
5.0000 mg | ORAL_TABLET | Freq: Four times a day (QID) | ORAL | Status: DC | PRN
Start: 2012-09-08 — End: 2012-09-09
  Administered 2012-09-08 – 2012-09-09 (×3): 5 mg via ORAL
  Filled 2012-09-08 (×3): qty 1

## 2012-09-08 NOTE — Allied Health Progress (Signed)
Case Management Progress Note    Comments:pt discussed with team and PT this am.  Pt is not moving with therapy very well and also having tremors (? ETOH w/d).  Met with pt and wife at bedside to discuss d/c options.  Pt informed that we have not been able to secure HH at this time due to the area he lives and no contracts with his insurance.  Discussed pt needing to be safe for d/c and pt states "I am fine- it will be fine."  Discussed the possibility of short term rehab with pt who does not like this option.  Pt agrees to work with PT again this afternoon to attempt independence with bed mobility and ambulation.  Pt to have CM contacted if he chooses to try short term rehab when med stable.  Update to bedside nurse as well.  Will keep trying to look for [facility] nursing and therapy.        Plan:will follow for stability for d/c.        Date: 09/08/2012 Time: 11:23  D Brettney Ficken, RN, MSN, PHN, CCM [phone], [phone]

## 2012-09-08 NOTE — Telephone Encounter (Signed)
Plans to return home with assist from wife  [facility]

## 2012-09-08 NOTE — Progress Notes (Signed)
Orthopaedic Progress Note  Date: 09/08/2012  Unit: D14 ORTHOPEDICS/TRAUMA  Attending of Record: Treatment Team: Attending Provider: Paulette Blanch, MD    Procedures: L THA Alan Mulder, 09/05/12)    Subjective:  Patient doing well, pain controlled, denies Chest pain, SOB. Foley replaced overnight due to urinary retention    Objective:  Temp src:  [-]   Temp:  [36.3 C (97.3 F)-37.1 C (98.8 F)]   Pulse:  [89-104]   BP: (97-109)/(65-68)   Resp:  [16-18]   SpO2:  [97 %-99 %]   Intake/Output:   Weight: 111.9 kg (246 lb 11.1 oz) (09/05/12 0709)     Last 24hr I/O's: In: 35 [Oral:1530; Crystalloid:4000]  Out: 1950 [Urine:1950]      PE:  Gen: NAD, comfortable  Left lower extremity:  Wound: inc c/d/i without erythema --> new dressing applied  Vascular:  toes warm and well perfused, capillary refill < 2s  Motor function: intact DF/PF/EHL  Sensory: S/S/SP/DP/T intact to light touch    Laboratory Tests:  Lab Results   Lab Name Value Date/Time    WBC 7.8 09/08/2012  1:25 AM    HGB 9.5* 09/08/2012  1:25 AM    HCT 26.7* 09/08/2012  1:25 AM    PLT 107* 09/08/2012  1:25 AM     Lab Results   Lab Name Value Date/Time    NA 131* 09/08/2012  1:25 AM    K 3.3 09/08/2012  1:25 AM    CL 101 09/08/2012  1:25 AM    CO2 24 09/08/2012  1:25 AM    BUN 9 09/08/2012  1:25 AM    CR 0.90 09/08/2012  1:25 AM    GLU 178* 09/08/2012  1:25 AM     INR   No results found for this basename: INR:* in the last 24 hours  PTT   No components found with this basename: IPTT:*    Assesment and Plan:  Argyle Gustafson is a 36yr male now POD #3 from L THA. Tachycardia resolved, no temp for 24 hours, WBC normal, UCx still pending. Continue to monitor. Alcohol withdrawal likely, lower suspicion for infection or PE/DVT    - PT/OT  - Pain control  - DVT prophylaxis: fragmin and SCDs  - Weight bearing status: WBAT LLE, posterior hip precautions  - Antibiotics: 24hr postop prophylaxis (Vanc, Clinda) completed  - Postop XRs ordered - implants in excellent position  - Okay to keep  Foley in today for bladder rest  - Dispo planning - home when stable and cleared by PT  - Appreciate Med Consult recs      Epifanio Lesches, MD  Adult Reconstruction  Orthopaedic Surgery  PI # [phone]

## 2012-09-08 NOTE — Progress Notes (Addendum)
INTERNAL MEDICINE CONSULT PROGRESS NOTE  Date: 09/08/2012  Time: 17:55        SUMMARY:    This is a 61 y/o M with h/o CAD s/p 25 stents and CABGx6, HTN, HLD, post op atrial fibrillation in the past who now admitted to the orthopedic service for hip replacement (POD #1), complicated by high fevers overnight and tachycardia for which IM was consulted.  (12/04 2158)    INTERVAL HISTORY:   afebrile  Tachycardia decreased to 100.   Cultures pending.     SUBJECTIVE:   No complaints.     MEDICATIONS:  Scheduled Medications     Allopurinol (ZYLOPRIM) Tablet 300 mg, ORAL, QAM PC  Atenolol (TENORMIN) Tablet 100 mg, ORAL, BID  Calcium-Cholecalciferol (D3) (OSCAL 500+D) Tablet 1 tablet, ORAL, BID  Dalteparin (FRAGMIN) Injection 5,000 Units, SUBCUTANEOUS, Daily 1000  Docusate (COLACE) Capsule 100 mg, ORAL, BID  Ezetimibe 10 mg/Simvastatin 80 mg (VYTORIN) Tablet 1 tablet, ORAL, QAM  Ferrous Sulfate EC Tablet 324 mg, ORAL, BID  Fluticasone/Salmeterol (ADVAIR DISKUS) 250-50 mcg/dose Inhaler 1 puff, INHALATION, BID  Hydrochlorothiazide (HYDRO-DIURIL) Tablet 25 mg, ORAL, QAM  Lisinopril (PRINIVIL, ZESTRIL) Tablet 20 mg, ORAL, QAM  Lorazepam (ATIVAN) Tablet 0.5 mg, ORAL, TID  Montelukast (SINGULAIR) Tablet 10 mg, ORAL, Daily 2100  Multivitamin with Minerals Tablet 1 tablet, ORAL, QAM  Pantoprazole (PROTONIX) Delayed Release Tablet 40 mg, ORAL, BID  Sennosides (SENOKOT) Tablet 17.2 mg, ORAL, Daily Bedtime    IV Medications   PRN Medications     Acetaminophen (TYLENOL) Tablet 650 mg, ORAL, Q4H PRN  Albuterol (PROAIR HFA, PROVENTIL HFA, VENTOLIN HFA) Inhaler 1-2 puff, INHALATION, Q4H PRN  Bisacodyl (DULCOLAX) Suppository 10 mg, RECTALLY, Q12H PRN  Diazepam (VALIUM) Tablet 5 mg, ORAL, Q6H PRN  Fluticasone (FLONASE) Nasal Spray 2 spray, EACH Nostril, BID prn  Hydrocodone 10 mg/Acetaminophen 325 mg (NORCO 10) Tablet 1-2 tablet, ORAL, Q4H PRN  Hydromorphone (DILAUDID) Injection 0.2-1 mg, IV, Q2H PRN  Magnesium Hydroxide (MILK OF MAGNESIA) 400  mg/5 mL Suspension 30 mL, ORAL, Q12H PRN  Naloxone (NARCAN) Injection 0.1 mg, IV, PRN  Ondansetron (ZOFRAN) Injection 4 mg, IV, Q8H PRN  Oxycodone (ROXICODONE) Tablet 5-10 mg, ORAL, Q4H PRN    OBJECTIVE:   Vital Signs  Summary  Temp Min: 36.3 C (97.3 F) Max: 37.3 C (99.1 F)  BP: (107-111)/(66-70)   Pulse Min: 72  Max: 102   Resp Min: 16  Max: 18   SpO2 Min: 95 % Max: 99 %       Current Vitals  Temp: 37.2 C (98.9 F)  BP: 111/70 mmHg  Pulse: 99   Resp: 16   SpO2: 95 %  Flow (L/Min)(Oxygen Therapy): 1    Weight: 111.9 kg (246 lb 11.1 oz)     Intake and Output  Last Two Completed Shifts  In: 5530 [Oral:1530; Crystalloid:4000]  Out: 1950 [Urine:1950]    Current Shift  In: 900 [Oral:900]  Out: 900 [Urine:900]    Physical Exam  General Appearance: healthy, alert, no distress, pleasant affect, cooperative.   Mouth: normal.   Neck: Neck supple. No adenopathy, thyroid symmetric, normal size.   Heart: normal rate and regular rhythm, no murmurs, clicks, or gallops.   Lungs: clear to auscultation.   Abdomen: BS normal. Abdomen soft, non-tender. No masses or organomegaly.   All 4 Extremities: no cyanosis, clubbing, or edema. RLE in a bandage.   Skin: Skin color, texture, turgor normal. No rashes or lesions.   Mental Status: Appearance/Cooperation: oriented times 3.  LAB TESTS/STUDIES:  Lab Results   Lab Name Value Date/Time    WBC 7.8 09/08/2012  1:25 AM    HGB 9.5* 09/08/2012  1:25 AM    HCT 26.7* 09/08/2012  1:25 AM    PLT 107* 09/08/2012  1:25 AM       BASIC METABOLIC PANEL Recent labs for the past 24 hours     09/08/12 0125    GLUCOSE 178*    UREA NITROGEN, BLOOD (BUN) 9    CREATININE BLOOD 0.90    SODIUM 131*    POTASSIUM 3.3    CHLORIDE 101    CARBON DIOXIDE TOTAL 24    CALCIUM 8.0*        ASSESSMENT AND RECOMMENDATION(S):  This is a 61 y/o M with h/o CAD s/p 25 stents and CABGx6, HTN, HLD, post op atrial fibrillation in the past who now admitted to the orthopedic service for hip replacement  (POD #1), complicated by high fevers overnight and tachycardia for which IM was consulted.     Fevers/Tachycardia: sinus tachycardia confirmed by EKG. Has not received albuterol. Likely 2/2 to underlying disease. Etiology may be infection given fevers. Patient seems to be euvolemic. Anxiety may also play a role as patient states that he is very anxious and usually has high HR after surgeries. Pain is well controlled as per patient so may not explain the tachycardia. Given fevers and persistent tachycardia must r/o infection. Pulmonary embolism should also be considered especially if tachycardia continues, which may also explain the fevers. No sign of Gout flare.     RECOMEDATIONS  -F/u blood cultures, urine cultures, especially since now differential shows left shift with 7% bandemia.  -If fever recurs; consider CXR as well.   -would hold off on empiric antibiotics-given lack of source, leucocytosis, and currently hemodynamicaly stable  -If no resolution of fever and/or tachycardia; Would consider duplex of lower extremities  -Also consider mild alcohol withdrawal/anxiety, and tachycardia Valium 5 mg see if tachycardia resolved.   -Consider repeat LFT's.    Lattie Corns, MD Resident  Pager #: [phone]     ATTENDING ADDENDUM:  This patient was seen, evaluated, and care plan was developed with the resident.  I agree with the assessment and plan as outlined in the resident's note.      Pt wife says he always has been very sweaty, after I noted he had beads of sweat on his face despite the fan in his room.  Might check if TSH has ever been done, or consider other hormonal component.    Report electronically signed by Girtha Rm, MD. Attending

## 2012-09-08 NOTE — Allied Health Progress (Signed)
Physical Therapy Progress Note    Date of Service: 09/08/2012  Time in:1015  Total Time:  S: 5 /10 Pain, located in L hip wife  reporting back problems. " I cant lift."    O: Bed mobilty -Family training initiated. Wife demonstrating A of pt with MOD verbal cues for safety and MOD A of LLE following posterior total hip precautions.     Transfers -sit to stand CGA from gurney using front wheeled walker.      Gait - AMB 82ft to B/R CGA with front wheeled walker. + tremors proximally.AMB 15 ft to edge of bed CGA with verbal cues to walk towards mid position of front wheeled walker.      Therapeutic Exercises- L hip heel slides,quad sets,hip abd/add , ankle pumps and glut sets 10x1 with 10 sec holds.     A: pt has not met goals. Wife has not cleared with A of pt.    P: BID TX with family training    Cherie Ouch, [place]  PI # [phone]  Vocera:  [phone]

## 2012-09-08 NOTE — Nurse Focus (Signed)
Patient was weak in  Attempting to ambulate and move with PT earlier this morning.  Late in morning patient began to have slight tremors, became flushed and somewhat confused (ETOH withdrawal?).  Patient drinks heavily at home.  Gave patient routine dose of Ativan ordered on scheduled basis plus Valium po ordered on po basis.  Confusion and tremulousness slightly improved but PT declined to work with patient this afternoon due to weakness and tremulousness and confusion.  Per Sheffield Slider in discharge planning, patient will stay for weekend most likely due to on going medical needs (detox symptoms).  Wife informed of patient's status.

## 2012-09-08 NOTE — Nurse Assessment (Signed)
ASSESSMENT NOTE    Note Started: 09/08/2012, 19:20     Initial assessment completed and recorded in EMR.  Report received from day shift nurse and orders reviewed. Plan of Care reviewed and appropriate, discussed with patient. Pt oriented to self, place and overall situation, although asking what time it is (with clock in view) and "what's going to happen now?" Pt denies any pain, dressing c/d/i. Blenda Peals RN

## 2012-09-08 NOTE — Allied Health Progress (Signed)
Occupational Therapy Progress Note    Date of Service: 09/08/2012   Time in: 1015  Total time: 30 Minutes    S: 5 /10 Pain, located in left hip.    O: tx:   Wife present for tx;  Pt on gurney, max verbal cueing and mod assist for supine to sit, functional mobility to bsc over toilet with verbal cueing and contact guard assist, transfer to bsc with contact guard assist and verbal cueing .   bm peri care with sba, max verbal cueing for technique.   Pt able to verbalize post hip precautions but needs cues to follow during lower body adls,   Sit edge of bed to supine with wife assisting with left LE, wife needing mod verbal cueing and pt needing mod assist.  Care transitioned to RN    A: very unsafe, wife not able to assist pt safely     P cont with oob adls     Jae Dire Kyah Buesing,OT/L  UJ#[id number]  [phone]

## 2012-09-08 NOTE — Allied Health Progress (Signed)
Physical Therapy Progress Note    Date of Service: 09/08/2012  Time in: 1400  Total Time: 0    S:RN has reported TX is not indicated for pt at this time 2/2 AMS. RN reports pt is having DTs. pts wife also reporting he is not oriented and is very confused.    P:Potential family training for 10 or 1100 Saturday if pts status improves.    Cherie Ouch, PTA  PI # [phone]  Vocera:  [phone]

## 2012-09-08 NOTE — Nurse Focus (Signed)
Recommended C-diff cx to Dr. Lindwood Qua. Ollen Barges RN

## 2012-09-09 MED ORDER — MAGNESIUM GLUCONATE 29.25 MG (500 MG) TABLET
1000.0000 mg | ORAL_TABLET | Freq: Once | ORAL | Status: AC
Start: 2012-09-09 — End: 2012-09-09
  Administered 2012-09-09: 1000 mg via ORAL
  Filled 2012-09-09 (×2): qty 2

## 2012-09-09 MED ORDER — DIAZEPAM 5 MG TABLET
5.0000 mg | ORAL_TABLET | Freq: Four times a day (QID) | ORAL | Status: DC | PRN
Start: 2012-09-09 — End: 2012-09-09

## 2012-09-09 MED ORDER — DIAZEPAM 5 MG TABLET
5.0000 mg | ORAL_TABLET | Freq: Four times a day (QID) | ORAL | Status: AC | PRN
Start: 2012-09-09 — End: 2012-09-11

## 2012-09-09 MED ORDER — POTASSIUM CHLORIDE ER 20 MEQ TABLET,EXTENDED RELEASE(PART/CRYST)
40.0000 meq | EXTENDED_RELEASE_TABLET | Freq: Two times a day (BID) | ORAL | Status: AC
Start: 2012-09-09 — End: 2012-09-09
  Administered 2012-09-09 (×2): 40 meq via ORAL
  Filled 2012-09-09 (×2): qty 2

## 2012-09-09 MED ORDER — MAGNESIUM SULFATE 500 MG/ML (50 %) INJECTION SOLUTION
3.0000 g | Freq: Once | INTRAMUSCULAR | Status: DC
Start: 2012-09-09 — End: 2012-09-09
  Filled 2012-09-09: qty 6

## 2012-09-09 NOTE — Progress Notes (Addendum)
Orthopaedic Progress Note  Date: 09/09/2012  Unit: D14 ORTHOPEDICS/TRAUMA  Attending of Record: Treatment Team: Attending Provider: Paulette Blanch, MD    Procedures: L THA Alan Mulder, 09/05/12)    Subjective:  Patient doing well, pain controlled, denies Chest pain, SOB. Patient feels fine, wants to go home    Objective:  Temp src:  [-]   Temp:  [36.8 C (98.2 F)-37.3 C (99.1 F)]   Pulse:  [72-101]   BP: (104-115)/(59-74)   Resp:  [14-18]   SpO2:  [93 %-96 %]   Intake/Output:   Weight: 111.9 kg (246 lb 11.1 oz) (09/05/12 0709)     Last 24hr I/O's: In: 900 [Oral:900]  Out: 1630 [Urine:1630]      PE:  Gen: NAD, comfortable  Left lower extremity:  Wound: dressing c/d/i without strikethrough  Vascular:  toes warm and well perfused, capillary refill < 2s  Calf NT  Motor function: intact DF/PF/EHL  Sensory: S/S/SP/DP/T intact to light touch    Laboratory Tests:  Lab Results   Lab Name Value Date/Time    WBC 7.7 09/09/2012 12:30 AM    HGB 8.2* 09/09/2012 12:30 AM    HCT 23.3* 09/09/2012 12:30 AM    PLT 191 09/09/2012 12:30 AM     Lab Results   Lab Name Value Date/Time    NA 133* 09/09/2012  3:00 AM    K 2.9* 09/09/2012  3:00 AM    CL 101 09/09/2012  3:00 AM    CO2 25 09/09/2012  3:00 AM    BUN 8 09/09/2012  3:00 AM    CR 0.76 09/09/2012  3:00 AM    GLU 172* 09/09/2012  3:00 AM     INR   No results found for this basename: INR:* in the last 24 hours  PTT   No components found with this basename: IPTT:*    Assesment and Plan:  [name] is a 53yr male now POD #4 from L THA. Tachycardia resolved, no temp for 48  hours, WBC normal, UCx negative, BCx pending. Continue to monitor. Alcohol withdrawal likely, lower suspicion for infection or PE/DVT    - PT/OT  - Pain control  - DVT prophylaxis: fragmin and SCDs  - Weight bearing status: WBAT LLE, posterior hip precautions  - Antibiotics: 24hr postop prophylaxis (Vanc, Clinda) completed  - Postop XRs ordered - implants in excellent position  - DC Foley today  - Dispo planning - home when  stable and cleared by PT  - Appreciate Med Consult recs. Spoke with Consult Team, who also agrees that infection and PE/DVT are less likely. Recommended Valium and f/u with PCP. Safe to dc home from their perspective.       Epifanio Lesches, MD  Adult Reconstruction  Orthopaedic Surgery  PI # [phone]

## 2012-09-09 NOTE — Allied Health Progress (Addendum)
Physical Therapy Note    Date: 09/09/2012  Time in: 11:40    Total Time: 30 Minutes    S: 7/10 pain located in left hip.      O: Patient seen for last PT treatment including family-caregiver training, instructions for proper car transfer technique, review on posterior hip precautions, safety precautions, and fall prevention. See Discharge Summary for additional details and final status.      A: Pt is mobilizing well using FWW with minimal pain issues. Caregiver training is complete. All functional mobility goals have been achieved. Pt is cleared from physical therapy. Pt has all needed DMEs in place.    P: D/C from acute PT.    Suszanne Finch PT  Vocera [phone]

## 2012-09-09 NOTE — Allied Health Progress (Signed)
Clinical Case Management Weekend/Holiday Note    Comments:  Rcv'd call from MD [phone] stating pt is medically stable for d/c and needs HH for PT.  Chart reviewed.   Referral was sent to several agencies but none accepting at this time.  Most responses state they are not contracted with [employer], other responses state they do not have availability.  Informed MD we would need to call [employer] on Monday to obtain a list of contracted agencies.        Further follow up needed:   MD to follow up with CM on Monday.      09/09/2012, 09:36      Electronically Signed by:    Alphonzo Lemmings, CASE MANAGER, RN  Pager: [phone]

## 2012-09-09 NOTE — Allied Health Progress (Signed)
Physical Therapy Progress Note    Date of Service: 09/09/2012   Time in: 08:15  Total time: 40 Minutes    S: 6/10 pain located in left hip. Denies dizziness or lightheadedness.    O: Patient agreeable to physical therapy. Session cleared by RN. Pt found supine with HOB slightly elevated.   Bed Mobility: min assist required for supine ? sit via longsitting, provided min verbal and kinesthetic cues for proper techniques in compliance with hip precautions.  Transfers: contact-guard assist sit ? stand and bed ? cardiac chair with FWW, min verbal cues for proper and safe techniques.  Gait: pt ambulates with FWW requiring standby assist for ~100 feet, provided moderate verbal and kinesthetic cues for strategies and techniques in compliance with hip precautions.  HEP: ankle pumps, heel slides, quad sets, gluteal sets  Review on precautions: 3/3 with min verbal cues  Pt was positioned back supine in bed with call light and phone within reach, bed in low position. Care transitioned back to RN.     A: Steady progress towards functional mobility goals using FWW with minimal pain issues.    P: Family-caregiver training, step-curb training.    Doran Durand Blayden Conwell PT  Vocera [phone]

## 2012-09-09 NOTE — Nurse Assessment (Signed)
ASSESSMENT NOTE    Note Started: 09/09/2012, 10:23     Initial assessment completed and recorded in EMR.  Report received from night shift nurse and orders reviewed. Plan of Care reviewed and appropriate, discussed with patient.  Desmond Lope,  RN

## 2012-09-09 NOTE — Allied Health Progress (Addendum)
[facility] -- ACUTE CARE SERVICE  PHYSICAL THERAPY DISCHARGE REPORT     Name: [name]  MRN: [id number]   Date: 09/09/2012    Initial Treatment Date:  09/06/2012  Onset Date of Illness or Injury:   09/05/2012    Primary Service:  Orthopedics    Principal and Significant Associated Diagnoses:  s/p left THA  Precautions: Fall precautions, Posterior hip precautions and WBAT LLE    Functional Status:    Key: Dep=Dependent     Max=Maximal     Mod=Moderate     Min=Minimal     CG=Contact Guard     SB=Stand-By     S=Supervised     I=Independent     NA=Not Applicable     NT=Not Tested     FWW=Front-Wheeled Walker     AC=Axillary Crutches     SPC=Single-Point Cane     Level of Assistance Dep Max Mod Min CG SB S I NA NT Comments:   Rolling            X    Scooting          X      Sidelying/ Supine to Sit        X      Transfer Sit to Stand        X      Transfer Bed to Chair        X      Gait          X   ~100 feet   Up/Down Step/Stairs        X      Other:  Mobilizing with FWW.     Discharge Recommendations:        Continued Physical Therapy:  Home Health PT and Outpatient PT                            Level of Assistance or Supervision:  Supervision with advanced transfers         Equipment:  Has FWW        Home Exercise Program: ankle pumps, heel slides, quad sets, gluteal sets and supine hip abduction    Disposition:  Home    Patient / Caregiver Training Completed:  Yes    Comments:  Pt and caregiver verbalized and demonstrated understanding for posterior hip precautions, for managing curb-step using FWW back-step approach, instructions for proper car transfer technique, fall prevention, safety precautions, and HEP.    Treatment Goals And Objectives:  Met goals    Interim Report and/ or Re-Evaluation Date(s):  N/A    Discharge Date:  09/09/2012      Reported by:    Suszanne Finch PT  Vocera [phone]

## 2012-09-09 NOTE — Nurse Focus (Signed)
Pt and wife given DC instructions both written and verbally, script x2 (one for Norco, one for ativan) and all questions answered. All belongings with patient. Pt dressed and awaiting patient transport for discharge.     Trula Ore RN

## 2012-09-09 NOTE — Discharge Summary (Signed)
Hospital Discharge Summary  PATIENT: [name]  MR #: [id number]  SEX: male  AGE: 28yr  DOB: 1950/10/13   ADMISSION DATE: 09/05/2012  DISCHARGE DATE: 09/09/2012     INPATIENT DISCHARGE SUMMARY    ADMITTING SERVICE: Orthopedics.    DISCHARGE SERVICE: Same.    ATTENDING PHYSICIAN AT DISCHARGE: Dr. Paulette Blanch, MD .    DISCHARGE DIAGNOSES:  Active Hospital Problems    Joint pain    L hip DJD    PROCEDURES PERFORMED:  L THA      Discharge Procedure Orders  HOME HEALTH FACE TO FACE/CERTIFICATION   Order Comments: Face to Face Attestation    I attest that I or another qualified licensed provider saw [name] on 09/06/12 and this face to face encounter meets the necessary Home Health requirements for a Face to Face visit within 90 days prior to, or within 30 days after, the start of care.     The encounter with the patient was in whole, or in part, for hip surgery, which is the primary reason for Home Health care.     The patient is homebound secondary to Leaving requires a taxing effort due to decreased mobility.   They require Moderate level of assistance with use of Walker to leave the home. The patient will require  Registered Nursing for  Medication Teaching / Admin, Pain Control / Symptoms management, Post - operative care / teaching and Wound Care / Teaching  Physical Therapy for Assessment and evaluation of functional status and safety, Patient / Caregiver education and training, Instruction in transfers, gait, and mobility skills, Durable medical equipment assessment and training, Home exercise programs to improve range of motion, strength, functional mobility and balance and Fall prevention     HOME HEALTH FACE TO FACE/CERTIFICATION   Order Comments: Face to Face Attestation    I attest that I or another qualified licensed provider saw [name] on 09/08/12 and this face to face encounter meets the necessary Home Health requirements for a Face to Face visit within 90 days prior to, or within 30 days  after, the start of care.     The encounter with the patient was in whole, or in part, for L THA, which is the primary reason for Home Health care.     The patient is homebound secondary to L THA.   They require Minimal level of assistance with use of Walker to leave the home. The patient will require  Registered Nursing for  Wound Care / Teaching  Physical Therapy for Home exercise programs to improve range of motion, strength, functional mobility and balance     DURABLE MEDICAL EQUIPMENT   Order Comments: Front wheel walker     HOME HEALTH REFERRAL AND CERTIFICATION   Order Comments: Clinical Indication:  Left THA 09/05/12 WBAT LLE, posterior hip precautions    RN/PT for evaluation/assessment/teaching for patient and family, wound care, pain assessment, VS, home safety eval, maximize mobility and independence, DME recommendations, fall prevention   Order Specific Question Answer Comments   Medically Necessary Home Health Services: 1. Skilled Nursing    Medically Necessary Home Health Services: 2. Physical Therapy    Clinic Contact Name/Phone # [facility] [phone]    Duration of therapy: 6 weeks    Name of licensed MD to follow patient [facility] REFERRAL AND CERTIFICATION   Order Comments: Clinical Indication:  L THA   Order Specific Question Answer Comments  Medically Necessary Home Health Services: 1. Skilled Nursing    Medically Necessary Home Health Services: 2. Physical Therapy    Clinic Contact Name/Phone # Letta Median, [phone]    Duration of therapy: 6wk    Name of licensed MD to follow patient [facility] DIET   Order Specific Question Answer Comments   Discharge Diet: Regular (no restrictions)      DISCHARGE FLUID INSTRUCTIONS   Order Specific Question Answer Comments   Discharge Fluid Instructions: No restrictions      DISCHARGE ACTIVITY RESTRICTIONS   Order Specific Question Answer Comments   Discharge Activity Restrictions: No restrictions      COMPLICATIONS  DURING ADMISSION   Order Specific Question Answer Comments   Complication(s) During Admission: None      CONDITION AT DISCHARGE   Order Specific Question Answer Comments   Condition at Discharge: Stable      DISCHARGE DISPOSITION   Order Specific Question Answer Comments   Discharge To: Home      .  LABS:  Recent labs for the past 48 hours     09/09/12 0030    WHITE BLOOD CELL COUNT 7.7    RED CELL COUNT 2.46*    HEMOGLOBIN 8.2*    HEMATOCRIT 23.3*     Recent labs for the past 48 hours     09/09/12 0300    GLUCOSE 172*    UREA NITROGEN, BLOOD (BUN) 8    CREATININE BLOOD 0.76    SODIUM 133*    POTASSIUM 2.9*    CHLORIDE 101    CARBON DIOXIDE TOTAL 25    CALCIUM 8.4*        Vitals: Temp: 36.9 C (98.5 F) (12/07 1234)  Temp src: Oral (12/07 1234)  Pulse: 94  (12/07 1234)  BP: 119/75 mmHg (12/07 1234)  Resp: 16  (12/07 1234)  SpO2: 95 % (12/07 1234)  Height: 177.8 cm (5\' 10" ) (12/02 1413)  Weight: 111.9 kg (246 lb 11.1 oz) (12/03 0709)  General: Alert, Oriented in no acute distress  Wound:C/D/I    COMPLICATIONS: None.    REASON FOR ADMISSION:  L hip pain from DJD that has failed conservative management    HOSPITAL COURSE:    [name] is a 61yr male was admitted on 09/05/2012  6:18 AM . The patient was taken to the OR on the day of admission and underwent a L THA without complication. The patient was subsequently taken to the Recovery Room where he remained stable and was transferred to the floor. Once on the floor, postoperative pain was controlled initially with IV PCA narcotic medication as well as PO narcotics.  He was initially tachycardic and febrile, and a febrile work-up was initiated but negative. The internal medicine service was consulted. He denied chest pain and SOB throughout the hospitalization. The symptoms gradually improved and was thought to be from alcohol withdrawal, as he was noted to shake and be tremulous with therapy.     The Foley was removed POD 2, but he had urinary retention,  and so it was replaced. After giving the bladder rest for 24 hours, the Foley was removed again and he was able to void. He was transitioned to oral pain meds POD 2/3. The incision was checked POD 3 and was noted to be clean, dry, and intact. He received 24 hours of post-op Clindamycin and Vancomycin. He was given SCDs and Fragmin for DVT prophylaxis.     He was cleared  by physical therapy with wife assist on POD #4. The internal medicine service also agreed that he was medically stable for discharge, so the patient was discharged home.      MEDICAL FOLLOW-UP CARE PLAN:    1. Future Appointments  Date Time Provider Department Center   09/22/2012 1:45 PM Paulette Blanch, MD [facility] None   12/22/2012 12:50 PM Su Monks, MD [facility] None      2. Take medications as directed.   3. Follow discharge instructions.    DISCHARGE MEDICATIONS:     [name], [name]   [facility] Medication Instructions UEA:[id number]    Printed on:09/09/12 1913   Medication Information                      Albuterol (PROAIR HFA) 90 mcg/actuation inhaler  Take 1-2 puffs by inhalation every 4 hours if needed for wheezing (or shortness of breath).             Allopurinol (ZYLOPRIM) 300 mg Tablet  Take 1 tablet by mouth every day.             Aspirin 325 mg Tablet Tablet               Atenolol (TENORMIN) 100 mg Tablet  Take 1 tablet by mouth 2 times daily. ( blood pressure )             CENTRUM SILVER TAB  take 1 tablet by oral route once daily             Cholecalciferol, Vitamin D3, (VITAMIN D) 1,000 unit Tablet  Take 5 tablets by mouth every day.             CO-ENZYME Q-10 100 mg cap  Take 200 mg by mouth every day.             Diazepam (VALIUM) 5 mg Tablet  Take 1 tablet by mouth every 6 hours if needed (shakiness, tremulousness (alcohol withdrawal)).             Ezetimibe 10 mg/Simvastatin 40 mg (VYTORIN) 10-40 mg Tablet  Take 1 tablet by mouth every evening.             Ezetimibe 10 mg/Simvastatin 80 mg (VYTORIN) 10-80 mg per tablet  Take 1 tablet  by mouth every day.             Fenofibric Acid (TRILIPIX) 135 mg CpDR  Take 1 capsule by mouth every day.             Fluticasone (FLONASE) 50 mcg/actuation nasal spray  Instill 2 sprays into EACH nostril every day. As needed.             Fluticasone/Salmeterol (ADVAIR DISKUS) 250-50 mcg/dose DsDv Inhaler  Take 1 puff by inhalation 2 times daily.             Indomethacin (INDOCIN) 50 mg Capsule               Lisinopril-Hydrochlorothiazide (PRINZIDE, ZESTORETIC) 20-25 mg per tablet  Take 1 tablet by mouth every morning.             Lorazepam (ATIVAN) 0.5 mg Tablet  Take 1 tablet by mouth 3 times daily. and 2 tabs at bedtime             Magnesium Oxide (MAG-OX 400) 400 mg Tablet  Take 400 mg by mouth every day.  Montelukast (SINGULAIR) 10 mg Tablet  Take 1 tablet by mouth every evening. take in the evening             Nitroglycerin (NITROSTAT) 0.4 mg Sublingual Tablet  Dissolve 1 tablet under the tongue every 5 minutes if needed for chest pain. If pain persists after a total of 3 tablets call 911.             Pantoprazole (PROTONIX) 40 mg Delayed Release Tablet  Take 1 tablet by mouth 2 times daily.             PATADAY 0.2 % Drops  INSTILL 1 DROP INTO EACH EYE EVERY DAY.             Sildenafil (VIAGRA) 100 mg Tablet  Take 1 tablet by mouth if needed. Prior to sexual activity             VITAMIN B-12 1,000 MCG TAB  once daily             VITAMIN C PO  400 mg daily                  Medication List      START taking these medications         Diazepam 5 mg Tablet   Commonly known as:  VALIUM   Take 1 tablet by mouth every 6 hours if needed (shakiness, tremulousness (alcohol withdrawal)).         CONTINUE taking these medications         Albuterol 90 mcg/actuation inhaler   Commonly known as:  PROAIR HFA   Take 1-2 puffs by inhalation every 4 hours if needed for wheezing (or shortness of breath).       Allopurinol 300 mg Tablet   Commonly known as:  ZYLOPRIM   Take 1 tablet by mouth every day.       Aspirin 325  mg Tablet Tablet       Atenolol 100 mg Tablet   Commonly known as:  TENORMIN       CENTRUM SILVER Tablet   Generic drug:  Multivitamins-Minerals-Lutein       CO-ENZYME Q-10 100 mg Capsule       [facility] VITAMIN B12 1,000 mcg tablet   Generic drug:  Cyanocobalamin       * Ezetimibe 10 mg/Simvastatin 80 mg 10-80 mg per tablet   Commonly known as:  VYTORIN   Take 1 tablet by mouth every day.       Fenofibric Acid 135 mg DR Capsule   Commonly known as:  TRILIPIX   Take 1 capsule by mouth every day.       Fluticasone 50 mcg/actuation nasal spray   Commonly known as:  FLONASE   Instill 2 sprays into EACH nostril every day. As needed.       Fluticasone/Salmeterol 250-50 mcg/dose Disk with Device Inhaler   Commonly known as:  ADVAIR DISKUS   Take 1 puff by inhalation 2 times daily.       Indomethacin 50 mg Capsule   Commonly known as:  INDOCIN       Lisinopril-Hydrochlorothiazide 20-25 mg per tablet   Commonly known as:  PRINZIDE, ZESTORETIC   Take 1 tablet by mouth every morning.       Lorazepam 0.5 mg Tablet   Commonly known as:  ATIVAN   Take 1 tablet by mouth 3 times daily. and 2 tabs at bedtime       Magnesium Oxide  400 mg Tablet   Commonly known as:  MAG-OX 400       Montelukast 10 mg Tablet   Commonly known as:  SINGULAIR   Take 1 tablet by mouth every evening. take in the evening       Nitroglycerin 0.4 mg Sublingual Tablet   Commonly known as:  NITROSTAT   Dissolve 1 tablet under the tongue every 5 minutes if needed for chest pain. If pain persists after a total of 3 tablets call 911.       Pantoprazole 40 mg Delayed Release Tablet   Commonly known as:  PROTONIX   Take 1 tablet by mouth 2 times daily.       PATADAY 0.2 % Drops   Generic drug:  Olopatadine   INSTILL 1 DROP INTO EACH EYE EVERY DAY.       Sildenafil 100 mg Tablet   Commonly known as:  VIAGRA   Take 1 tablet by mouth if needed. Prior to sexual activity       VITAMIN C PO       Vitamin D 1,000 unit Tablet   Generic drug:  Cholecalciferol       * Notice:   This list has 1 medication(s) that are the same as other medications prescribed for you. Read the directions carefully, and ask your doctor or other care provider to review them with you.      ASK your doctor about these medications         Enoxaparin 40 mg/0.4 mL Syringe   Commonly known as:  LOVENOX   Inject 40 mg subcutaneously every 24 hours.       * Ezetimibe 10 mg/Simvastatin 40 mg 10-40 mg Tablet   Commonly known as:  VYTORIN   Take 1 tablet by mouth every evening.       * Notice:  This list has 1 medication(s) that are the same as other medications prescribed for you. Read the directions carefully, and ask your doctor or other care provider to review them with you.            Where to Get Your Medications    Information on where to get these meds is not yet available. Ask your nurse or doctor.        -  Diazepam 5 mg Tablet                        DISCHARGE INSTRUCTIONS:  LLE posterior hip precautions, WBAT  Keep incision clean, dry, and covered.   Monitor for signs of infection including fever greater than 102 F, drainage, warmth or swelling of the wound.  Go to the closest ER or call clinic [phone] for any of these symptoms.      Condition at discharge:   The patient was seen and examined by the orthopedic team today and we believe the patient is stable for discharge.

## 2012-09-09 NOTE — Progress Notes (Addendum)
INTERNAL MEDICINE CONSULT PROGRESS NOTE  Date: 09/09/2012  Time: 12:48        SUMMARY:    This is a 61 y/o M with h/o CAD s/p 25 stents and CABGx6, HTN, HLD, post op atrial fibrillation in the past who now admitted to the orthopedic service for hip replacement (POD #1), complicated by high fevers overnight and tachycardia for which IM was consulted.  (12/04 2158)    INTERVAL HISTORY:   -Afebrile, HR < 100  - watery stools    SUBJECTIVE:   No complaints.     MEDICATIONS:  Scheduled Medications     Allopurinol (ZYLOPRIM) Tablet 300 mg, ORAL, QAM PC  Atenolol (TENORMIN) Tablet 100 mg, ORAL, BID  Calcium-Cholecalciferol (D3) (OSCAL 500+D) Tablet 1 tablet, ORAL, BID  Dalteparin (FRAGMIN) Injection 5,000 Units, SUBCUTANEOUS, Daily 1000  Docusate (COLACE) Capsule 100 mg, ORAL, BID  Ezetimibe 10 mg/Simvastatin 80 mg (VYTORIN) Tablet 1 tablet, ORAL, QAM  Ferrous Sulfate EC Tablet 324 mg, ORAL, BID  Fluticasone/Salmeterol (ADVAIR DISKUS) 250-50 mcg/dose Inhaler 1 puff, INHALATION, BID  Hydrochlorothiazide (HYDRO-DIURIL) Tablet 25 mg, ORAL, QAM  Lisinopril (PRINIVIL, ZESTRIL) Tablet 20 mg, ORAL, QAM  Lorazepam (ATIVAN) Tablet 0.5 mg, ORAL, TID  Montelukast (SINGULAIR) Tablet 10 mg, ORAL, Daily 2100  Multivitamin with Minerals Tablet 1 tablet, ORAL, QAM  Pantoprazole (PROTONIX) Delayed Release Tablet 40 mg, ORAL, BID  Potassium Chloride (KLOR-CON M20) SR Tablet 40 mEq, ORAL, BIDAC  Sennosides (SENOKOT) Tablet 17.2 mg, ORAL, Daily Bedtime    IV Medications   PRN Medications     Acetaminophen (TYLENOL) Tablet 650 mg, ORAL, Q4H PRN  Albuterol (PROAIR HFA, PROVENTIL HFA, VENTOLIN HFA) Inhaler 1-2 puff, INHALATION, Q4H PRN  Bisacodyl (DULCOLAX) Suppository 10 mg, RECTALLY, Q12H PRN  Diazepam (VALIUM) Tablet 5 mg, ORAL, Q6H PRN  Fluticasone (FLONASE) Nasal Spray 2 spray, EACH Nostril, BID prn  Hydrocodone 10 mg/Acetaminophen 325 mg (NORCO 10) Tablet 1-2 tablet, ORAL, Q4H PRN  Hydromorphone (DILAUDID) Injection 0.2-1 mg, IV, Q2H  PRN  Magnesium Hydroxide (MILK OF MAGNESIA) 400 mg/5 mL Suspension 30 mL, ORAL, Q12H PRN  Naloxone (NARCAN) Injection 0.1 mg, IV, PRN  Ondansetron (ZOFRAN) Injection 4 mg, IV, Q8H PRN  Oxycodone (ROXICODONE) Tablet 5-10 mg, ORAL, Q4H PRN    OBJECTIVE:   Vital Signs  Summary  Temp Min: 36.7 C (98.1 F) Max: 37.2 C (98.9 F)  BP: (104-119)/(59-75)   Pulse Min: 90  Max: 101   Resp Min: 14  Max: 18   SpO2 Min: 93 % Max: 98 %       Current Vitals  Temp: 36.9 C (98.5 F)  BP: 119/75 mmHg  Pulse: 94   Resp: 16   SpO2: 95 %  Flow (L/Min)(Oxygen Therapy): 1    Weight: 111.9 kg (246 lb 11.1 oz)     Intake and Output  Last Two Completed Shifts  In: 900 [Oral:900]  Out: 1630 [Urine:1630]    Current Shift       Physical Exam  General Appearance: obese man, working with physical therapy.   Mouth: normal.   Neck: Neck supple. No adenopathy, thyroid symmetric, normal size.   Heart: normal rate and regular rhythm  Lungs: clear to auscultation.   Abdomen: BS normal. Abdomen soft, non-tender. No masses or organomegaly.   All 4 Extremities: no cyanosis, clubbing, or edema.    Skin: Skin color, texture, turgor normal. No rashes or lesions.   Mental Status: Appearance/Cooperation: oriented times 3.    LAB TESTS/STUDIES:  Lab Results  Lab Name Value Date/Time    WBC 7.7 09/09/2012 12:30 AM    HGB 8.2* 09/09/2012 12:30 AM    HCT 23.3* 09/09/2012 12:30 AM    PLT 191 09/09/2012 12:30 AM       BASIC METABOLIC PANEL Recent labs for the past 24 hours     09/09/12 0300    GLUCOSE 172*    UREA NITROGEN, BLOOD (BUN) 8    CREATININE BLOOD 0.76    SODIUM 133*    POTASSIUM 2.9*    CHLORIDE 101    CARBON DIOXIDE TOTAL 25    CALCIUM 8.4*     C.diff: neg  Blood culture: ngtd x2     ASSESSMENT AND RECOMMENDATION(S):  This is a 61 y/o M with h/o CAD s/p 25 stents and CABGx6, HTN, HLD, post op atrial fibrillation in the past who now admitted to the orthopedic service for hip replacement (POD #4), with persistent tachycardia for  which IM was consulted.     Tachycardia: sinus tachycardia confirmed by EKG. Has not received albuterol. He had 4 hours of fevers post operatively, however none since, making etiology less likely to be infectious. Urine cx negative, blood cx NGTD, CXR without infiltrate. He is having diarrhea with low K to support severity of disease. Anxiety may also play a role as patient states that he is very anxious and usually has high HR after surgeries, he is on his home ativan regimen. He is a heavy alcohol user and has tremor/anxiety at baseline for which he takes ativan TID and atenolol. Continuing that here. Pain is well controlled as per patient so may not explain the tachycardia. Pulmonary embolism less likely given normal EKG and stable vital signs, but could be considered if tachycardia continues. No sign of Gout flare.     Diarrhea: with low K. c.diff negative. Could be due to intraoperative antibiotics.   - replace K and Mag carefully  - start lactobacillus     RECOMEDATIONS  - start lactobacillus for diarrhea  - replace K and Mag  -F/u blood cultures   -If fever recurs; consider CXR as well.   -would hold off on empiric antibiotics-given lack of source, leucocytosis, and currently hemodynamicaly stable  -If no resolution of fever and/or tachycardia; Would consider duplex of lower extremities.   -Consider repeat LFT's.      The patient was seen and staffed with Dr. Glenna Durand. Please page [phone] with questions.    Luretha Murphy. Shona Simpson, MD  [facility]- Internal Medicine PGY 2  Pager # [phone]  PI# [phone]      ATTENDING ADDENDUM:  This patient was seen, evaluated, and care plan was developed with the resident.  I agree with the assessment and plan as outlined in the resident's note.  Report electronically signed by Girtha Rm, MD. Attending

## 2012-09-09 NOTE — Progress Notes (Incomplete)
INTERNAL MEDICINE CONSULT PROGRESS NOTE  Date: 09/09/2012  Time: 10:22        SUMMARY:    This is a 61 y/o M with h/o CAD s/p 25 stents and CABGx6, HTN, HLD, post op atrial fibrillation in the past who now admitted to the orthopedic service for hip replacement (POD #1), complicated by high fevers overnight and tachycardia for which IM was consulted.  (12/04 2158)    INTERVAL HISTORY:   afebrile  Tachycardia decreased to 90.   Cultures: 1. C. Diff: negative; 2. Resp viral: pending    SUBJECTIVE:   Nause from yesterday afternoon, and today morning. 2 epsode. Cold sweater when leave bed. Denys pain      MEDICATIONS:  Scheduled Medications     Allopurinol (ZYLOPRIM) Tablet 300 mg, ORAL, QAM PC  Atenolol (TENORMIN) Tablet 100 mg, ORAL, BID  Calcium-Cholecalciferol (D3) (OSCAL 500+D) Tablet 1 tablet, ORAL, BID  Dalteparin (FRAGMIN) Injection 5,000 Units, SUBCUTANEOUS, Daily 1000  Docusate (COLACE) Capsule 100 mg, ORAL, BID  Ezetimibe 10 mg/Simvastatin 80 mg (VYTORIN) Tablet 1 tablet, ORAL, QAM  Ferrous Sulfate EC Tablet 324 mg, ORAL, BID  Fluticasone/Salmeterol (ADVAIR DISKUS) 250-50 mcg/dose Inhaler 1 puff, INHALATION, BID  Hydrochlorothiazide (HYDRO-DIURIL) Tablet 25 mg, ORAL, QAM  Lisinopril (PRINIVIL, ZESTRIL) Tablet 20 mg, ORAL, QAM  Lorazepam (ATIVAN) Tablet 0.5 mg, ORAL, TID  Magnesium Gluconate Tablet 1,000 mg, ORAL, ONCE  Montelukast (SINGULAIR) Tablet 10 mg, ORAL, Daily 2100  Multivitamin with Minerals Tablet 1 tablet, ORAL, QAM  Pantoprazole (PROTONIX) Delayed Release Tablet 40 mg, ORAL, BID  Potassium Chloride (KLOR-CON M20) SR Tablet 40 mEq, ORAL, BIDAC  Sennosides (SENOKOT) Tablet 17.2 mg, ORAL, Daily Bedtime    IV Medications   PRN Medications     Acetaminophen (TYLENOL) Tablet 650 mg, ORAL, Q4H PRN  Albuterol (PROAIR HFA, PROVENTIL HFA, VENTOLIN HFA) Inhaler 1-2 puff, INHALATION, Q4H PRN  Bisacodyl (DULCOLAX) Suppository 10 mg, RECTALLY, Q12H PRN  Diazepam (VALIUM) Tablet 5 mg, ORAL, Q6H PRN  Fluticasone  (FLONASE) Nasal Spray 2 spray, EACH Nostril, BID prn  Hydrocodone 10 mg/Acetaminophen 325 mg (NORCO 10) Tablet 1-2 tablet, ORAL, Q4H PRN  Hydromorphone (DILAUDID) Injection 0.2-1 mg, IV, Q2H PRN  Magnesium Hydroxide (MILK OF MAGNESIA) 400 mg/5 mL Suspension 30 mL, ORAL, Q12H PRN  Naloxone (NARCAN) Injection 0.1 mg, IV, PRN  Ondansetron (ZOFRAN) Injection 4 mg, IV, Q8H PRN  Oxycodone (ROXICODONE) Tablet 5-10 mg, ORAL, Q4H PRN    OBJECTIVE:   Vital Signs  Summary  Temp Min: 36.7 C (98.1 F) Max: 37.2 C (98.9 F)  BP: (104-116)/(59-74)   Pulse Min: 90  Max: 101   Resp Min: 14  Max: 18   SpO2 Min: 93 % Max: 98 %       Current Vitals  Temp: 36.7 C (98.1 F)  BP: 116/73 mmHg  Pulse: 90   Resp: 16   SpO2: 98 %  Flow (L/Min)(Oxygen Therapy): 1    Weight: 111.9 kg (246 lb 11.1 oz)     Intake and Output  Last Two Completed Shifts  In: 900 [Oral:900]  Out: 1630 [Urine:1630]    Current Shift       Physical Exam  General Appearance: healthy, alert, no distress, pleasant affect, cooperative.   Mouth: normal.   Neck: Neck supple. No adenopathy, thyroid symmetric, normal size.   Heart: normal rate and regular rhythm, no murmurs, clicks, or gallops.   Lungs: clear to auscultation.   Abdomen: BS normal. Abdomen soft, non-tender. No masses or organomegaly.  All 4 Extremities: no cyanosis, clubbing, or edema. RLE in a bandage.   Skin: Skin color, texture, turgor normal. No rashes or lesions.   Mental Status: Appearance/Cooperation: oriented times 3.    LAB TESTS/STUDIES:  Lab Results   Lab Name Value Date/Time    WBC 7.7 09/09/2012 12:30 AM    HGB 8.2* 09/09/2012 12:30 AM    HCT 23.3* 09/09/2012 12:30 AM    PLT 191 09/09/2012 12:30 AM       BASIC METABOLIC PANEL Recent labs for the past 24 hours     09/09/12 0300    GLUCOSE 172*    UREA NITROGEN, BLOOD (BUN) 8    CREATININE BLOOD 0.76    SODIUM 133*    POTASSIUM 2.9*    CHLORIDE 101    CARBON DIOXIDE TOTAL 25    CALCIUM 8.4*     CXR:  (09/08/2012)  IMPRESSION:  HYPOVENTILATORY CHANGES. OTHERWISE NO ACTIVE CARDIOPULMONARY DISEASE     ASSESSMENT AND RECOMMENDATION(S):  This is a 61 y/o M with h/o CAD s/p 25 stents and CABGx6, HTN, HLD, post op atrial fibrillation in the past who now admitted to the orthopedic service for hip replacement (POD #1), complicated by high fevers overnight and tachycardia for which IM was consulted.     Fevers/Tachycardia: well controled    RECOMEDATIONS  -F/u blood cultures, urine cultures, especially since now differential shows left shift with 7% bandemia.  -If fever recurs; consider CXR as well.   -would hold off on empiric antibiotics-given lack of source, leucocytosis, and currently hemodynamicaly stable  -If no resolution of fever and/or tachycardia; Would consider duplex of lower extremities  -Also consider mild alcohol withdrawal/anxiety, and tachycardia Valium 5 mg see if tachycardia resolved.   -Consider repeat LFT's.    ATTENDING ADDENDUM:  This patient was seen, evaluated, and care plan was developed with the resident.  I agree with the assessment and plan as outlined in the resident's note.      Pt wife says he always has been very sweaty, after I noted he had beads of sweat on his face despite the fan in his room.  Might check if TSH has ever been done, or consider other hormonal component.    Report electronically signed by Girtha Rm, MD. Attending

## 2012-09-10 NOTE — Progress Notes (Signed)
This patient was seen, evaluated, and care plan was developed with the resident.  I agree with the assessment and plan as outlined in the resident's note.  I personally reviewed the image and agree with the resident's assessment.  Report electronically signed by Paulette Blanch, MD. Attending       Warned not to drink pre-op.  He promised me he will give up.    Drinks a bottle of wine a day.

## 2012-09-10 NOTE — Allied Health Progress (Signed)
This Discharge Summary Report based on Occupational therapy progress notes; no treatment or assessment rendered this date.  PM&R -- ACUTE CARE SERVICE  OCCUPATIONAL THERAPY DISCHARGE REPORT   Name: [name]  MRN: [id number]   Date: 09/10/2012    Initial Treatment Date:  09/07/12  Onset Date of Illness or Injury:   09/05/2012    Primary Service:  Orthopedics    Principal and Significant Associated Diagnoses: L hip DJD; s/p left THA    Activities Of Daily Living:    Functional level taken from combination of therapy notes  Key: Dep=Dependent     Max=Maximal     Mod=Moderate     Min=Minimal     CG=Contact Guard     SB=Stand-By     S=Supervised     I=Independent     NA=Not Applicable     NT=Not Tested     N=Normal     G=Good     F=Fair     P=Poor     U=Unable     FWW=Front-Wheeled Walker     AC=Axillary Crutches     SPC=Single-Point Cane     Level of Assistance: Dep Max Mod Min CG SB S I NA NT Comments   Bed Mobility (roll/self-adjust)        x      Sidelying To Sit / Supine To Sit        x      Transfer Bed To Chair        x      Transfer Bed To Toilet        x      Transfer To Tub / Shower       x       Grooming / Hygiene      x        Toileting     x         Bathing Upper          x    Bathing Lower          x    Upper Body Dress     x         Lower Body Dress     x         Self Feeding                            Other:     Other Major Improvements: Pt appeared to have cleared cognitively.     Therapy Program / Type Of Continued Therapy Recommendations:  Recommend supervision with shower transfers    Recommendations:       Level of Assistance Or Supervision:  As above              Home Needs:  In place         Special Equipment:  In place         Adaptive Equipment:  Private purchase    Actual Disposition:  To home    Patient / Caregiver Training Completed:  yes        Comments:       Treatment Goals And Objectives:  Met goals    Interim Report and/ or Re-Evaluation Date(s):  n/a    Discharge Date:   09/09/12    Reported by:  Jacob Moores, MOT, OTR/L  PI: [id number]  Vocera: [phone]

## 2012-09-12 MED FILL — cellulose, oxidized 4" X 8" pads: Qty: 1 | Status: AC

## 2012-09-12 MED FILL — sodium chloride 0.9 % irrigation solution: Qty: 500 | Status: AC

## 2012-09-12 MED FILL — sodium chloride 0.9 % irrigation solution: Qty: 2000 | Status: AC

## 2012-09-12 MED FILL — bacitracin 50,000 unit intramuscular solution: INTRAMUSCULAR | Qty: 1 | Status: AC

## 2012-09-13 ENCOUNTER — Ambulatory Visit

## 2012-09-13 ENCOUNTER — Encounter: Payer: Self-pay | Admitting: Internal Medicine

## 2012-09-13 ENCOUNTER — Telehealth: Payer: Self-pay

## 2012-09-13 ENCOUNTER — Ambulatory Visit: Admitting: Internal Medicine

## 2012-09-13 VITALS — BP 112/73 | HR 89 | Temp 97.3°F | Resp 18 | Wt 248.3 lb

## 2012-09-13 NOTE — Telephone Encounter (Signed)
Responded to patient's My Chart concern about abnormal urination.  He states his urine stream is "spraying" and is wondering if urination will return to normal with time.  He has appointment with pcp today.  Advised he likely continues with edema from catheter placement that has not yet resolved.  He also noted edema to his foot today, in addition to the rest of his leg that has been swollen since surgery.  He was quite active yesterday and edema has increased since he has been up today.  Was able to secure home health via [facility] who will start RN and PT services today or tomorrow.

## 2012-09-13 NOTE — Progress Notes (Signed)
[name] is a 61yr old male presents for several concerns.  Recent hip surgery, left.  Initial swelling which was as expected but over the last day has noted markedly progressive swelling in his left lower extremity including his foot and he was concerned about possible abnormality/deep venous thrombosis.  Denies significant increased pain.  Surgical wound has been doing well, no signs of infection.  Edema does improve somewhat with leg elevation.  Is on Lovenox for DVT prophylaxis since surgery.  Has been compliant with medication.     Other issue of concern is abnormal urination.  Since surgery has noted difficulty with stream/spraying of urine.  No pain.  No bloody urine.  No difficulty fully emptying bladder.  No similar previous problems.  Did have a Foley catheter for the surgery and shortly thereafter.    Patient Active Problem List   Diagnosis    ESOPHAGEAL REFLUX    Barrett's esophagus    Benign neoplasm of colon    FAMILY HX GI MALIGNANCY    GOUT UNSPECIFIED    BENIGN ESSENTIAL HYPERTENSION    OTHER AND UNSPECIFIED HYPERLIPIDEMIA    Gout    INFLAM SPONDYLOPATHY NOS    Angina    CAD (coronary artery disease)    Anxiety    S/P CABG x 6    A-fib    Premature ventricular contractions (PVCs) (VPCs)    Adjustment disorder with depressed mood    Alcohol dependence, continuous    Tremor    Chronic gastritis    LOC PRIM OSTEOART-PELVIS    Hip osteoarthritis    Diabetes type 2, controlled    Allergic rhinitis due to pollen    Acute atopic conjunctivitis    EXTRINSIC ASTHMA UNSPECIFIED    Joint pain     History   Substance Use Topics    Smoking status: Former Smoker -- 2.00 packs/day for 10 years     Types: Cigarettes    Smokeless tobacco: Never Used    Comment: quit at age 75    Alcohol Use: Yes      chardonnay mostly; 1-2 bottles of wine per day on average       OBJECTIVE:  BP 112/73  Pulse 89  Temp(Src) 36.3 C (97.3 F) (Tympanic)  Resp 18  Wt 112.628 kg (248 lb 4.8 oz)   BMI 35.63 kg/m2  He appears well, in no apparent distress.  Alert and oriented, pleasant and cooperative.  Exam notable for well appearing surgical wound left hip, no evidence of infection.  Diffuse swelling left lower extremity down to the foot and toes.  No redness.  Calf is tense but not particularly tender to palpation.  Genital exam is notable for no penile visible abnormality, normal-appearing meatus  Minimal scrotal swelling, no masses  Abdomen is soft, nontender      Impression/Plan:      (788.69) Other abnormality of urination  (primary encounter diagnosis)  Comment: Abnormal distal urethral urinary symptoms  Etiology unclear, appears to be related to postop status, no symptoms to suggest infectious etiology  May be related to some scar tissue/post catheter trauma   Manual assistance discussed  Follow up with Urology if needed, persistent concerns.   Plan: UROLOGY CLINIC REFERRAL            (782.3) Edema  Comment: Likely postop left lower extremity edema, already on DVT prophylaxis  Plan: Leg elevation, followup with orthopedics as planned, if progressively worsening, bilateral edema to suggest overall fluid overload, develops significant symptoms  suggestive of congestive heart failure, further evaluation  Do not feel that lower extremity doppler is needed at this time in the context of recent surgery, dependent edema  Clinical follow up for now   Follow up as needed       Barriers to Learning assessed: none. Patient verbalizes understanding of teaching and instructions.    Electronically Signed By:     Woodward Ku M.D.  Attending Physician  [facility]

## 2012-09-13 NOTE — Nursing Note (Signed)
>>   STEFANIE MURPHY     Wed Sep 13, 2012 12:25 PM  Vital signs taken, allergies verified, screened for pain, med hx taken,  screened for chicken pox, and verified immunization status.  [name], MA

## 2012-09-13 NOTE — Patient Instructions (Signed)
Dr Suszanne Finch  Address: [address], [place], [place] [address]   [phone]

## 2012-09-15 ENCOUNTER — Encounter: Payer: Self-pay | Admitting: Internal Medicine

## 2012-09-15 MED ORDER — CLOBETASOL 0.05 % TOPICAL CREAM
TOPICAL_CREAM | Freq: Two times a day (BID) | TOPICAL | Status: AC
Start: 2012-09-15 — End: 2013-09-10

## 2012-09-15 NOTE — Telephone Encounter (Signed)
From: [name]  To: Aurelio Jew, MD  Sent: 09/15/2012 7:13 AM PST  Subject: Non-urgent Medical Advice Question    Good Morning Dr. Kirtland Bouchard,  I have tried the Cortizone 10 cream for my rash on my back...Marland KitchenMarland Kitchenit works for about an hour only. You mentioned you have an alternative so I am hoping that I can pick something up at [facility]. Thank you!  [name]

## 2012-09-18 ENCOUNTER — Other Ambulatory Visit: Payer: Self-pay | Admitting: ORTHOPAEDIC SURGERY

## 2012-09-18 ENCOUNTER — Telehealth: Payer: Self-pay | Admitting: ORTHOPAEDIC SURGERY

## 2012-09-18 MED ORDER — HYDROCODONE 10 MG-ACETAMINOPHEN 325 MG TABLET
1.0000 | ORAL_TABLET | ORAL | Status: AC
Start: 2012-09-18 — End: 2012-10-03

## 2012-09-21 ENCOUNTER — Ambulatory Visit

## 2012-09-22 ENCOUNTER — Ambulatory Visit: Admitting: ORTHOPAEDIC SURGERY

## 2012-09-22 VITALS — Temp 96.3°F

## 2012-09-22 NOTE — Progress Notes (Signed)
PATIENTLUNDEN, [name]  MR #:  [id number]  DOB:  May 24, 1951  SEX:  M  AGE:  61  SERVICE DATE:  09/22/2012  ORTHOPAEDICS CLINIC NOTE    SUBJECTIVE:    [name] is 2 weeks out of his left total hip arthroplasty with a  cemented stem, who presents for followup appointment.  On interview,  the patient has been doing pretty well.  He has been ambulating fine  without any assistive devices.  He is using occasional narcotic for  pain relief.  He denies any wound complications.  Denies any fevers.    OBJECTIVE:    On general physical examination, generally the patient is in no acute  distress.  Alert and orient x3.  Examination of his left incision  looks well.  No erythema, no fluctuance, no induration.  No drainage.  It looks almost all healed up.  He is otherwise distally  neurovascularly intact.    ASSESSMENT AND PLAN:    [name] is 2 weeks out of his left total hip arthroplasty.    PLAN:    1)  The patient was seen and discussed Dr. Lucille Passy.  2)  Will further  discuss regarding patient's heavy drinking.  He says that he admits he  has had 2 drinks last night, and otherwise apparently he is not being  as compliant as we want him to be in regards to his alcohol intake.  At this point, we reiterated to the patient the importance of  abstaining from alcohol until the acetabular component heals up.  We  discussed this with him in detail, including with his wife, who agrees  and is supportive of our recommendation.  Otherwise, we will see  patient back as discussed.      Report Electronically Signed - 10/10/2012 20:49:21 by  Holland Commons, MD  Resident  Orthopaedic Surgery    Report Electronically Signed - 10/05/2012 22:53:37 by  Paulette Blanch, MD  Attending    Orthopaedic Surgery    JRB/MODL  D:  09/22/2012 12:26:36 PDT/PST  T:  09/22/2012 16:27:26 PDT/PST  Job #:  [id number] / [id number]

## 2012-09-22 NOTE — Progress Notes (Signed)
This patient was seen, evaluated, and care plan was developed with the resident.  I agree with the assessment and plan as outlined in the resident's note.  I personally reviewed the image and agree with the resident's assessment.  Report electronically signed by Paulette Blanch, MD. Attending       Doing well.

## 2012-09-22 NOTE — Nursing Note (Signed)
>>   Harl Favor, MA     Fri Sep 22, 2012 11:56 AM  Patient roomed, vitals taken, allergies reviewed, screened for pain.   Juanell Fairly MA

## 2012-10-12 ENCOUNTER — Encounter: Payer: Self-pay | Admitting: Internal Medicine

## 2012-10-12 NOTE — Telephone Encounter (Signed)
From: [name]  To: Aurelio Jew, MD  Sent: 10/12/2012 9:13 AM PST  Subject: Non-urgent Medical Advice Question    Hi Dr. Sherrian Divers New Year!  I saw Dr. Emelda Fear on Monday....looking at my latest lab results he says I am anemic. I have definitely been more tired lately....any thoughts?  [name]

## 2012-10-16 ENCOUNTER — Other Ambulatory Visit: Payer: Self-pay | Admitting: ORTHOPAEDIC SURGERY

## 2012-10-16 MED ORDER — HYDROCODONE 10 MG-ACETAMINOPHEN 325 MG TABLET
1.0000 | ORAL_TABLET | Freq: Four times a day (QID) | ORAL | Status: AC | PRN
Start: 2012-10-16 — End: 2012-11-16

## 2012-10-17 ENCOUNTER — Ambulatory Visit

## 2012-10-18 ENCOUNTER — Ambulatory Visit: Admitting: ORTHOPAEDIC SURGERY

## 2012-10-18 ENCOUNTER — Encounter: Payer: Self-pay | Admitting: Internal Medicine

## 2012-10-18 VITALS — Temp 97.2°F | Ht 70.0 in | Wt 243.0 lb

## 2012-10-18 NOTE — Telephone Encounter (Signed)
Dr Felipa Furnace,  Please see message/concern regarding findings on colonoscopy path from November for this patient.  I do not see that he was contacted with further instructions post colonoscopy.  Please advise.  Charlot Gouin M.D.

## 2012-10-18 NOTE — Patient Instructions (Addendum)
A referral has been made for (if applicable):   Labs ordered (if applicable):   Update imaging studies needed (MRI, CT etc.  If applicable):    You may call the clinic for an update if any referrals have been made for your visit today.    Please call the clinic @ [phone] of you develop a fever of 101F or greater, develop redness,  increased pain or swelling out of proportion, or go to the nearest Emergency Department.

## 2012-10-18 NOTE — Telephone Encounter (Signed)
From: [name]  To: Aurelio Jew, MD  Sent: 10/18/2012 10:44 AM PST  Subject: Non-urgent Medical Advice Question    Hi Dr. Kirtland Bouchard,  Being pre-occupied with my hip surgery on 12/3 I just now looked at the results from my colonoscopy. Initially the report was no polyps so I was not concerned. When you get a chance can you look at the report from 12/5...sounds like I have IBS, Crohn's and cancer...Marland Kitchena trifecta if ever I saw one. Thank you.  [name]

## 2012-10-19 ENCOUNTER — Encounter: Payer: Self-pay | Admitting: GASTROENTEROLOGY

## 2012-10-19 NOTE — Progress Notes (Signed)
[facility]s Clinic Note  Date: 10/19/2012  Attending of Record:      Procedures:  09/05/12 - L [place]/Tritanium Hybrid THR        Subjective:  62 y/o patient who present 6 weeks s/p above mentioned procedure.  On interview pt says he is doing well, soreness with sitting or walking for to loing.  Patient  is t taking narcotics.  Patient is e ambulating well.  Patient is not using assitsive devices for ambulation.  Pt  denies of fevers.  Pt denies  of wound drainage or complications.  Pt admits to still drinking, but insists he is cutting down.    Objective:  Temp src:  [-]   Temp:  [36.2 C (97.2 F)]   Pulse: --  BP: --  Resp: --  SpO2: --       PE:  Gen: NAD, comfortable  L lower extremity:  Wound: incision healed,  with out erythema  Vascular: palpable dorsalis pedis pulse, toes warm and well perfused, capillary refill < 2s  Motor function: intact DF/PF/EHL  Sensory: S/S/SP/DP/T intact to light touch    Images: ap pelvis/2 views of hip - no evidence of loosening or hardware failure    Assesment and Plan:  [name] is a 11yr male now 6wks out from above mentioned procedure - doing well    - pt was seen and evaluated with Dr. Alan Mulder  - discussed again with the pt the importance and reason that he had to stop drinking.  We discussed with the patient that he broke his promise of stopping to drink.  We explained again that if he continued to drink, his chances of cup looseing and requiring revision would increase. Pt states he understands his risks.   - all of pt's questions were answered  - Pt to f/u in 6 weeks.      Electronically signed by:  Erle Crocker, MD  Resident Physician, PGY5  Department of Orthopaedics  PI# [id number]  pgr [phone]

## 2012-10-19 NOTE — Telephone Encounter (Signed)
From: [name]  To: Roda Shutters, MD  Sent: 10/18/2012 8:04 PM PST  Subject: Non-urgent Medical Advice Question    Hi Dr. Felipa Furnace,  After I had my colonoscopy on 11/21..I had my left hip replaced on 12/3. I left your office and you advised that I had no polyps but might have Crohn's and you did several biopsies that would take weeks for results. I became preoccupied with the hip...and just today went looking for the results of those biopsies. I have no idea why they did not show up as a message on my e-mail unless they coincided with another "MY Chart" message...bottom line.Marland KitchenMarland KitchenMarland KitchenI was surprised and confused by the results...just could not understand. I contacted my PC...Dr. Felicita Gage....she advised I contact you to find out what if any follow up might be necessary...and exactly what all the technical jargon means to a layman like myself. She looked at them and feels it is inflamatory bowel disease.  Please advise when you get a chance...thank you.  [name]

## 2012-10-20 ENCOUNTER — Telehealth: Payer: Self-pay | Admitting: GASTROENTEROLOGY

## 2012-10-20 NOTE — Telephone Encounter (Signed)
Called patient in response to his mychart concern. Explain his result and the need to make a follow up appointment with Dr. Felipa Furnace in [facility] clinic. Verbalized understanding.Elton Sin, RN

## 2012-10-24 ENCOUNTER — Telehealth: Payer: Self-pay | Admitting: GASTROENTEROLOGY

## 2012-10-24 ENCOUNTER — Encounter: Payer: Self-pay | Admitting: GASTROENTEROLOGY

## 2012-10-24 NOTE — Telephone Encounter (Signed)
Patient called requesting to be scheduled at the [facility] previously seen at the [facility] clinic. Please advise if okay to schedule at [facility]?    Thank you  Ancil Linsey  [facility]

## 2012-10-24 NOTE — Telephone Encounter (Signed)
From: [name]  To: Roda Shutters, MD  Sent: 10/24/2012 12:39 PM PST  Subject: Non-urgent Medical Advice Question    OK, Thank you Dr. Felipa Furnace,  I called yesterday for an appointment but the office was closed.Marland KitchenMarland KitchenI will call now.  [name]

## 2012-10-24 NOTE — Telephone Encounter (Signed)
Please make a follow up appt. Overbook Feb 4. My chart sent to patient

## 2012-10-24 NOTE — Telephone Encounter (Signed)
Ok to overbook feb 4

## 2012-10-24 NOTE — Telephone Encounter (Signed)
Please schedule patient on 2/4 as requested by Dr. Felipa Furnace as a time was not provided.     Thank you  Ancil Linsey  [facility]

## 2012-10-25 ENCOUNTER — Encounter: Payer: Self-pay | Admitting: Internal Medicine

## 2012-10-25 NOTE — Telephone Encounter (Signed)
From: [name]  To: Aurelio Jew, MD  Sent: 10/25/2012 3:25 PM PST  Subject: Non-urgent Medical Advice Question    Hi Dr. Kirtland Bouchard,  I believe it is time for a visit. I still have no appointment with Dr. Felipa Furnace....waiting for them to call me?? I am extremely fatigued...cannot stay awake for more than 2 hours. I have abdominal pain and bowel movements every hour or two (not diarreah). It started a couple of weeks ago....then disappeared. Now it's been four days in a row....definitely something wrong...inflamed bowel disease or possibly celiacs....just need some help dealing with it.  Thank you,  [name]

## 2012-10-27 ENCOUNTER — Other Ambulatory Visit: Payer: Self-pay | Admitting: Internal Medicine

## 2012-10-27 ENCOUNTER — Ambulatory Visit

## 2012-10-27 NOTE — Telephone Encounter (Signed)
Future Appointments  Date Time Provider Department Center   11/07/2012 3:35 PM Roda Shutters, MD [facility] None   12/01/2012 10:15 AM Paulette Blanch, MD [facility]THO None   12/22/2012 12:50 PM Su Monks, MD [facility] None

## 2012-10-30 ENCOUNTER — Encounter: Payer: Self-pay | Admitting: GASTROENTEROLOGY

## 2012-10-30 NOTE — Telephone Encounter (Signed)
From: [name]  To: Roda Shutters, MD  Sent: 10/27/2012 5:36 PM PST  Subject: Non-urgent Medical Advice Question    Hello Dr. Felipa Furnace,  I realize that it is late on Friday, and I also know that you are unaware of the fact that I have been experiencing symptoms. Two weeks ago for two days and then it stopped for 3-4 days...now for 6 days in a row and continuing. Abdominal pain, loose stool about every hour or two (urgent) and just a small amount comes out but it feels like there is a lot more even though I cannot pass anything else. Unusual amount of clear mucous being coughed up...and I mean lots! Also fatigue and loss of appetite mostly due to fear of starting something. Is there anything that I can buy O-T-C to give myself some relief until I see you on 2/4?? I do not want to put anything in my system that might make it worse.  Thank you  [name] [employer]

## 2012-10-30 NOTE — Telephone Encounter (Signed)
From: [name]  To: Roda Shutters, MD  Sent: 10/29/2012 11:20 PM PST  Subject: Non-urgent Medical Advice Question    Hi Dr. Felipa Furnace,  Please disregard my message from late Friday afternoon.Marland KitchenMarland KitchenI am feeling much better...at least at the moment. I will see you on 2/4....thank you.  [name] [employer]

## 2012-11-01 ENCOUNTER — Ambulatory Visit: Admitting: Internal Medicine

## 2012-11-01 VITALS — BP 135/87 | HR 92 | Temp 97.5°F | Resp 18 | Wt 249.0 lb

## 2012-11-01 MED ORDER — LORAZEPAM 0.5 MG TABLET
0.5000 mg | ORAL_TABLET | Freq: Three times a day (TID) | ORAL | Status: DC
Start: 2012-11-01 — End: 2013-05-03

## 2012-11-01 NOTE — Nursing Note (Signed)
>>   Ann Maki, MA     Wed Nov 01, 2012  4:43 PM  vitals taken, allergies reviewed, pain level recorded.  Diane Joya Gaskins MAII

## 2012-11-01 NOTE — Progress Notes (Signed)
[name] is a 62yr old male presents for follow up diarrhea, previous findings suggestive of IBD, follow up labs.    Feeling much better. Abdominal symptoms resolved. Unclear if related to IBD or viral infection or ??  No fever and chills. No abdominal pain at this time. No persistent diarrhea. No blood in stool.  Recent labs notable for resolved electrolyte abnormality and postop anemia resolved as well.    Reviewed previous colonoscopy results. Initial lab serology negative for IBD; follow up lab results pending.     Patient Active Problem List   Diagnosis    ESOPHAGEAL REFLUX    Barrett's esophagus    Benign neoplasm of colon    FAMILY HX GI MALIGNANCY    GOUT UNSPECIFIED    BENIGN ESSENTIAL HYPERTENSION    OTHER AND UNSPECIFIED HYPERLIPIDEMIA    Gout    INFLAM SPONDYLOPATHY NOS    Angina    CAD (coronary artery disease)    Anxiety    S/P CABG x 6    A-fib    Premature ventricular contractions (PVCs) (VPCs)    Adjustment disorder with depressed mood    Alcohol dependence, continuous    Tremor    Chronic gastritis    LOC PRIM OSTEOART-PELVIS    Hip osteoarthritis    Diabetes type 2, controlled    Allergic rhinitis due to pollen    Acute atopic conjunctivitis    EXTRINSIC ASTHMA UNSPECIFIED    Joint pain      OBJECTIVE:  BP 135/87  Pulse 92  Temp(Src) 36.4 C (97.5 F) (Tympanic)  Resp 18  Wt 112.946 kg (249 lb)  BMI 35.73 kg/m2  He appears well, in no apparent distress.  Alert and oriented, pleasant and cooperative.      Impression/Plan:      (787.91) Diarrhea  (primary encounter diagnosis)  Comment: resolved   Plan: follow up with GI as planned.  Unclear at this time if will need to consider treatment since unclear if having active symptoms related to IBD     (276.9) Electrolyte and fluid disorders not elsewhere classified  Comment: resolved   Plan: clinical follow up     (285.9) Anemia  Comment: resolved   Plan: follow up as needed     I spent with the patient, 10  minutes of which were spent counseling and coordinating care as noted above.        Barriers to Learning assessed: none. Patient verbalizes understanding of teaching and instructions.    Electronically Signed By:     Woodward Ku M.D.  Attending Physician  [facility]

## 2012-11-07 ENCOUNTER — Ambulatory Visit

## 2012-11-07 ENCOUNTER — Ambulatory Visit: Admitting: GASTROENTEROLOGY

## 2012-11-07 ENCOUNTER — Encounter: Payer: Self-pay | Admitting: GASTROENTEROLOGY

## 2012-11-07 VITALS — BP 144/93 | HR 90 | Temp 96.6°F | Resp 12 | Ht 70.0 in | Wt 249.0 lb

## 2012-11-07 NOTE — Progress Notes (Signed)
MR#: [id number]   DOB: 02/13/1951   DOS: 11/07/2012    Aurelio Jew, MD, MD  Aurelio Jew, MD  [address] [facility]  [place] [address]    Dear Aurelio Jew, MD,    We had the pleasure of consulting with your patient, [name], in Gastroenterology clinic today for follow up regarding his colonoscopy and biopsy results. As you are aware his colonoscopy was consistent with with mild inflammation, but IBD serologies were negative. He states that about three weeks ago he had severe abdominal pain and loose stool x 2 days. The following week he had similar symptoms that lasted six days. He was going to the bathroom with a sense of bowel urgency but only producing a small amount of loose stool. He did not notice any blood in his stool. Since then he has been symptom free. He denies N/V, fevers/chills or recent travel.     ROS: Constitutional: negative.  Eyes: negative.  Ears, Nose, Mouth, Throat: negative.  CV: negative.  Resp: negative.  GI: nausea.  GU: negative.  Musculoskeletal: negative.  Integumentary: negative.  Neuro: negative.  Psych: Anxious/agitated.  Endo: negative.  Heme/Lymphatic: negative.  Allergy/Immun: negative.      Recent examinations:     Pathology from Colonoscopy in Nov 2013:            A.  TERMINAL ILEUM (BIOPSY):     - MILD TO MODERATE CHRONIC ACTIVE ILEITIS     - FIBRINOPURULENT DEBRIS CONSISTENT WITH ULCER       B.  RIGHT COLON (BIOPSY):     - MILD CHRONIC ACTIVE COLITIS       C.  TRANSVERSE COLON (BIOPSY):     - COLONIC MUCOSA WITH NO SIGNIFICANT PATHOLOGIC CHANGE       D.  LEFT COLON (BIOPSY):     - MINIMAL CHRONIC ACTIVE COLITIS       E.  RECTOSIGMOID COLON (BIOPSY)     - MILD CHRONIC ACTIVE PROCTOCOLITIS.    [facility]  IBD serology: Pattern not consistent with IBD       Allergies:   Allergies   Allergen Reactions    Keflex (Cephalexin) Rash           Medications:   Current  Outpatient Prescriptions   Medication Sig    Allopurinol (ZYLOPRIM) 300 mg Tablet Take 1 tablet by mouth every day.    Aspirin 325 mg Tablet Tablet     Atenolol (TENORMIN) 100 mg Tablet Take 1 tablet by mouth 2 times daily. ( blood pressure )    CENTRUM SILVER TAB take 1 tablet by oral route once daily    Cholecalciferol, Vitamin D3, (VITAMIN D) 1,000 unit Tablet Take 5 tablets by mouth every day.    Clobetasol (TEMOVATE) 0.05 % Cream Apply  to the affected area 2 times daily.    CO-ENZYME Q-10 100 mg cap Take 200 mg by mouth every day.    Ezetimibe 10 mg/Simvastatin 80 mg (VYTORIN) 10-80 mg per tablet Take 1 tablet by mouth every day.    Fenofibric Acid (TRILIPIX) 135 mg CpDR Take 1 capsule by mouth every day.    Fluticasone (FLONASE) 50 mcg/actuation nasal spray Instill 2 sprays into EACH nostril every day. As needed.    Fluticasone/Salmeterol (ADVAIR DISKUS) 250-50 mcg/dose DsDv Inhaler Take 1 puff by inhalation 2 times daily.    Hydrocodone 10 mg/Acetaminophen 325 mg (NORCO) 10-325 mg per tablet Take 1 tablet by mouth every  6 hours if needed.    Indomethacin (INDOCIN) 50 mg Capsule     Lisinopril-Hydrochlorothiazide (PRINZIDE, ZESTORETIC) 20-25 mg per tablet Take 1 tablet by mouth every morning.    Lorazepam (ATIVAN) 0.5 mg Tablet Take 1 tablet by mouth 3 times daily. and 2 tabs at bedtime    Magnesium Oxide (MAG-OX 400) 400 mg Tablet Take 400 mg by mouth every day.    Montelukast (SINGULAIR) 10 mg Tablet Take 1 tablet by mouth every evening. take in the evening    Nitroglycerin (NITROSTAT) 0.4 mg Sublingual Tablet Dissolve 1 tablet under the tongue every 5 minutes if needed for chest pain. If pain persists after a total of 3 tablets call 911.    Pantoprazole (PROTONIX) 40 mg Delayed Release Tablet Take 1 tablet by mouth 2 times daily.    PATADAY 0.2 % Drops INSTILL 1 DROP INTO EACH EYE EVERY DAY.    Sildenafil (VIAGRA) 100 mg Tablet Take 1 tablet by mouth if needed. Prior to sexual activity     VITAMIN B-12 1,000 MCG TAB once daily    VITAMIN C PO 400 mg daily     No current facility-administered medications for this visit.         Past Medical History:   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     Acute myocardial infarction of other inferior wall, episode of care unspecified 1996    Appendicitis, acute 07/02/2009    Angina 10/31/2009    Shortness of breath     Coronary artery disease     Arthritis     Psychiatric illness     Diabetes type 2, controlled 03/17/2012    EXTRINSIC ASTHMA UNSPECIFIED 05/11/2012    Balance problem     COPD (chronic obstructive pulmonary disease)          Past Surgical History:   Past Surgical History   Procedure Laterality Date    Insert intracoronary stent       Stent placement x 25 by report    Egd  2006    Colonoscopy  3/08    Removal of tonsils,<11 y/o       Tonsillectomy-childhood    Appendectomy  2010    Colonoscopy,diagnostic  08/10/2010     Colonoscopy    Upper gi endoscopy,biopsy  06/17/2011          Colonoscopy,diagnostic  08/24/2012     Colonoscopy    Colonoscopy,biopsy  08/24/2012          Total hip arthroplasty  09/05/2012              Family History:   Family History   Problem Relation Age of Onset    Hypertension Brother     Cancer Mother 62     colon, 39's       Social History:   History     Social History    Marital Status: MARRIED     Spouse Name: N/A     Number of Children: N/A    Years of Education: N/A     Occupational History  Not on file.     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  Heart: normal rate and regular rhythm, no murmurs, clicks, or gallops.  Lungs: clear to auscultation.  Abdomen: BS normal. Abdomen soft, non-tender. No masses or organomegaly.  Extremities: no cyanosis, clubbing, or edema.  Skin: Skin color, texture, turgor normal. No rashes or lesions.    Labs:     BCP:   Lab Results   Lab Name Value Date/Time    NA 136 10/27/2012  1:12 PM    K 4.0 10/27/2012  1:12 PM    CL 99 10/27/2012  1:12 PM    CO2 26 10/27/2012  1:12 PM    BUN 8 09/09/2012  3:00 AM    CR 0.76 09/09/2012  3:00 AM    GLU 172* 09/09/2012  3:00 AM       LFT:   Lab Results   Lab Name Value Date/Time    AST 101* 08/22/2012 11:31 AM    ALT 59 08/22/2012 11:31 AM    ALP 53 08/22/2012 11:31 AM    ALB 3.7 08/22/2012 11:31 AM    TP 6.6 08/22/2012 11:31 AM    TBIL 0.9 08/22/2012 11:31 AM       CBC:   Lab Results   Lab Name Value Date/Time    WBC 9.4 10/27/2012  1:12 PM    HGB 14.7 10/27/2012  1:12 PM    HCT 43.2 10/27/2012  1:12 PM    PLT 222 10/27/2012  1:12 PM         Hemoglobin A1C:   Lab Results   Lab Name Value Date/Time    HGBA1C 6.0* 07/13/2012 10:35 AM    HGBA1C 5.9* 05/15/2012  8:09 AM    HGBA1C 5.8* 02/24/2012 10:06 AM        Assessment and Plan:  (558.9) Other and unspecified noninfectious gastroenteritis and colitis  (primary encounter diagnosis)  Comment:  Although hsi IBD serology was negative, his colonoscopy and follow-up biopsy was consistent with mild inflammation of the colon and terminal ileum. He also had non-bloody loose stool for a few days, which could be due to the inflammation, although viral gastroenteritis cannot be ruled out. We will repeat the IBD serology (which was cancelled was unknown reason). Unfortunately the patient left before he could be evaluated by Dr  Felipa Furnace. Attempts to reach him over the phone were not successful.   Plan: C-REACTIVE PROTEIN,            LAB MISCELLANEOUS PROCEDURE - IBD serology            Will monitor for now. May consider Mesalamine at a later date            Low threshold for repeat colonoscopy if diarrhea retruns                Thank you for allowing [place] to participate in the care of this patient. Please do not hesitate to contact us with any questions.    Note: Patient left before he could be seen by Dr Felipa Furnace.    Sincerely,    This report has been electronically signed by  Laruth Bouchard  PGY 3, Internal Medicine  Pager [phone]

## 2012-11-07 NOTE — Progress Notes (Signed)
Patient left without seen me. After seen the resident the patient left. Called the patient with no response.  The patient is currently asymptomatic, had ileitis on colonoscopy with negative serologies, The IBD serologies were drawn and cancelled in the lab I done know why  Rec:  Draw the [facility] IBD diagnostic again  One could observe if the symptoms recur then low treshold to repeat a look at the mucosa, the findings at the time of colonoscopy were very mild.   Also obtain a baseline c reactive protein  Roda Shutters, MD

## 2012-11-07 NOTE — Nursing Note (Signed)
>>   Elizebeth Koller, MA     Tue Nov 07, 2012  3:45 PM  Patient is in room #7    Medication list given at the time of check in for review by patient, instructed patient to indicate to the doctor any corrections that need to be made for Medication Reconciliation.    Patient identifiers obtained. vital signs taken, screened for pain, allergies checked, patient roomed, and Roda Shutters, MD notified.     Elizebeth Koller, MA

## 2012-11-08 ENCOUNTER — Encounter: Payer: Self-pay | Admitting: Internal Medicine

## 2012-11-08 ENCOUNTER — Other Ambulatory Visit: Payer: Self-pay | Admitting: GASTROENTEROLOGY

## 2012-11-08 ENCOUNTER — Encounter: Payer: Self-pay | Admitting: GASTROENTEROLOGY

## 2012-11-08 NOTE — Progress Notes (Signed)
This patient was seen, evaluated, and care plan was developed with the resident.  I agree with the assessment and plan as outlined in the resident's note.  Report electronically signed by Arrick Dutton Carlos Shammond Arave, MD. Attending

## 2012-11-08 NOTE — Telephone Encounter (Signed)
From: [name]  To: Aurelio Jew, MD  Sent: 11/08/2012 3:00 PM PST  Subject: Non-urgent Medical Advice Question     Someone by the name of Risa Grill in [facility] "cancelled" my [facility] lab test...all by her little lonesome....if it were up to me I'd FIRE her.Marland KitchenMarland KitchenMarland KitchenMarland Kitchentwo doctors visits at $50 in co-pay, 65 miles on the car, over 3 hours of waiting time for me....and nothing.  I had them redone today---I just hope the dope is on vacation when it comes through.  [name]

## 2012-11-08 NOTE — Telephone Encounter (Signed)
From: [name]  To: Roda Shutters, MD  Sent: 11/08/2012 3:03 PM PST  Subject: Non-urgent Medical Advice Question    Some one by the name of Risa Grill in [facility] cancelled my [facility] lab....all by her little lonesome. So two doctors visits, $50 in co-pay, 65 miles on the car, and over 3 hours of waiting time for me....for nothing. If it were up to me I would FIRE her!  I had them redone today.  [name] [employer]

## 2012-11-16 ENCOUNTER — Encounter: Payer: Self-pay | Admitting: Internal Medicine

## 2012-11-16 MED ORDER — FLUTICASONE PROPIONATE 50 MCG/ACTUATION NASAL SPRAY,SUSPENSION
2.0000 | Freq: Every day | NASAL | Status: AC
Start: 2012-11-16 — End: 2015-04-15

## 2012-11-16 NOTE — Telephone Encounter (Signed)
From: [name]  To: Aurelio Jew, MD  Sent: 11/15/2012 9:04 PM PST  Subject: Non-urgent Medical Advice Question    Hi Stefanie,  I know Dr. Kirtland Bouchard is away....any shot at getting an Rx renewed?  Fluticasone Prop 50 MCG Spray....current Rx expired at the end of 12/13.  If not no worries.Marland KitchenMarland KitchenI'll wait until next week....thank you!  [name]

## 2012-11-29 ENCOUNTER — Other Ambulatory Visit: Payer: Self-pay | Admitting: ORTHOPAEDIC SURGERY

## 2012-11-29 DIAGNOSIS — M25559 Pain in unspecified hip: Secondary | ICD-10-CM

## 2012-12-01 ENCOUNTER — Ambulatory Visit: Admitting: ORTHOPAEDIC SURGERY

## 2012-12-01 VITALS — BP 144/87 | HR 89 | Temp 96.9°F | Resp 20 | Ht 70.0 in | Wt 249.0 lb

## 2012-12-01 NOTE — Progress Notes (Signed)
[name], [name]  MR #:  [id number]  DOB:  1951-01-16  SEX:  M  AGE:  62  SERVICE DATE:  12/01/2012  ORTHOPAEDICS CLINIC NOTE    REASON FOR VISIT:    Postoperative followup.    PROCEDURE PERFORMED:    Left hip total hip arthroplasty, 09/05/12.    HISTORY OF PRESENT ILLNESS:    [name] comes in for followup.  He is a 62 year old gentleman who  is now three months status post the above procedure.  He is doing  quite well.  He would like to be cleared to go back to work.  His pain  is well controlled.    PHYSICAL EXAMINATION:    On examination, his incision is well healed.  His hip range of motion  is very good.  His calf is nontender and not swollen.  He is distally  neurovascular intact.    IMAGING:    X-rays taken today show hardware in good alignment.  No evidence of  failure or bone cut out.    ASSESSMENT AND PLAN:    [name] is doing quite well.  We have cleared him to go back to  work.  We would like to see him back in six to eight weeks for repeat  clinical exam.      Report Electronically Signed - 12/05/2012 10:00:29 by  Fleeta Emmer, MD  Resident  Orthopaedic Surgery    Report Electronically Signed - 12/25/2012 08:11:56 by  Paulette Blanch, MD  Attending    Orthopaedic Surgery    LEC/lw  D:  12/01/2012 11:36:51 PDT/PST  T:  12/01/2012 13:57:17 PDT/PST  Job #:  [id number] / [id number]

## 2012-12-01 NOTE — Patient Instructions (Signed)
A referral has been made for (if applicable):   Labs ordered (if applicable):   Update imaging studies needed (MRI, CT etc.  If applicable):    You may call the clinic for an update if any referrals have been made for your visit today.    Please call the clinic @ [phone] of you develop a fever of 101F or greater, develop redness,  increased pain or swelling out of proportion, or go to the nearest Emergency Department.

## 2012-12-14 ENCOUNTER — Encounter: Payer: Self-pay | Admitting: Internal Medicine

## 2012-12-14 NOTE — Telephone Encounter (Signed)
From: [name]  To: Aurelio Jew, MD  Sent: 12/14/2012 1:41 PM PDT  Subject: Non-urgent Medical Advice Question    Hi Dr. Kirtland Bouchard,  I returned home during the wee hours of 3/12 from a week in [place].Marland Kitchen...woke up with raspy coughing chest....probably from the plane ride. It is not going away and phlegm is yellow. Advair and albuterol help to bring it up and give temporary relief but it continues all day and night. Do I need a visit or just some cough syrup.....concerned about my COPD and it getting possibly worse.Thank you....  [name] [employer]

## 2012-12-15 ENCOUNTER — Ambulatory Visit: Admitting: Internal Medicine

## 2012-12-15 ENCOUNTER — Ambulatory Visit

## 2012-12-15 ENCOUNTER — Encounter: Payer: Self-pay | Admitting: Internal Medicine

## 2012-12-15 VITALS — BP 134/87 | HR 96 | Temp 97.3°F | Resp 18

## 2012-12-15 MED ORDER — AMOXICILLIN 875 MG-POTASSIUM CLAVULANATE 125 MG TABLET
1.0000 | ORAL_TABLET | Freq: Two times a day (BID) | ORAL | Status: AC
Start: 2012-12-15 — End: 2012-12-25

## 2012-12-15 MED ORDER — ALBUTEROL SULFATE HFA 90 MCG/ACTUATION AEROSOL INHALER
1.0000 | INHALATION_SPRAY | RESPIRATORY_TRACT | Status: AC | PRN
Start: 2012-12-15 — End: 2013-06-13

## 2012-12-15 MED ORDER — ALLOPURINOL 300 MG TABLET
1.0000 | ORAL_TABLET | Freq: Every day | ORAL | Status: AC
Start: 2012-12-15 — End: 2013-12-15

## 2012-12-15 MED ORDER — PRAMIPEXOLE 0.125 MG TABLET
0.1250 mg | ORAL_TABLET | Freq: Every day | ORAL | Status: AC
Start: 2012-12-15 — End: 2013-12-15

## 2012-12-15 MED ORDER — FLUTICASONE 110 MCG/ACTUATION AEROSOL ORAL INHALER
1.0000 | INHALATION_SPRAY | Freq: Two times a day (BID) | RESPIRATORY_TRACT | Status: AC
Start: 2012-12-15 — End: 2013-12-10

## 2012-12-15 MED ORDER — MONTELUKAST 10 MG TABLET
10.0000 mg | ORAL_TABLET | Freq: Every day | ORAL | Status: AC
Start: 2012-12-15 — End: 2013-12-10

## 2012-12-15 MED ORDER — PANTOPRAZOLE 40 MG TABLET,DELAYED RELEASE
1.0000 | DELAYED_RELEASE_TABLET | Freq: Two times a day (BID) | ORAL | Status: AC
Start: 2012-12-15 — End: 2013-12-15

## 2012-12-15 MED ORDER — LISINOPRIL 20 MG-HYDROCHLOROTHIAZIDE 25 MG TABLET
1.0000 | ORAL_TABLET | Freq: Every day | ORAL | Status: AC
Start: 2012-12-15 — End: 2013-12-15

## 2012-12-15 NOTE — Nursing Note (Signed)
>>   STEFANIE MURPHY     Fri Dec 15, 2012 11:56 AM  Vital signs taken, allergies verified, screened for pain, med hx taken,  screened for chicken pox, and verified immunization status.  [name], MA

## 2012-12-15 NOTE — Telephone Encounter (Signed)
Seen in the office 12/15/2012  Braxon Suder M.D.

## 2012-12-15 NOTE — Progress Notes (Signed)
SUBJECTIVE: [name] is a 62yr old male who complains of increased cough with associated symptoms of sinus and nasal congestion, post nasal drip, chest congestion, wheezing, productive cough and moderate shortness of breath with exertion and moderate wheezing at rest and with exertion for the past 3+ days. He denies a history of chills and fevers. He has a history of moderately persistent asthma. The patient does not currently smoke cigarettes. Using albuterol inhaler 4-5 x per day now, helps bring up sputum. Sputum dark yellow-green, thick. Continues on advair twice daily. Not sure why but higher dose advair interfered with sleep. Was to be on singulair per Dr Burt Knack but not sure why he stopped taking it.   Symptoms started after recent travel by airplane from [place]. [place]  Denies chest pain  No increased lower extremity edema  Hip doing well post hip surgery    New problem of restless legs. Only noted at night. Difficulty falling asleep. Has to sleep in separate bed from wife due to this.  Symptoms present before but much worse last month or so. Now interfering with sleep.    No changes in chronic lorazepam  Lorazepam does not help above symptoms     Patient Active Problem List   Diagnosis    ESOPHAGEAL REFLUX    Barrett's esophagus    Benign neoplasm of colon    FAMILY HX GI MALIGNANCY    GOUT UNSPECIFIED    BENIGN ESSENTIAL HYPERTENSION    OTHER AND UNSPECIFIED HYPERLIPIDEMIA    Gout    INFLAM SPONDYLOPATHY NOS    Angina    CAD (coronary artery disease)    Anxiety    S/P CABG x 6    A-fib    Premature ventricular contractions (PVCs) (VPCs)    Adjustment disorder with depressed mood    Alcohol dependence, continuous    Tremor    Chronic gastritis    LOC PRIM OSTEOART-PELVIS    Hip osteoarthritis    Diabetes type 2, controlled    Allergic rhinitis due to pollen    Acute atopic conjunctivitis    EXTRINSIC ASTHMA UNSPECIFIED    Joint pain      History   Substance Use Topics     Smoking status: Former Smoker -- 2.00 packs/day for 10 years     Types: Cigarettes    Smokeless tobacco: Never Used    Comment: quit at age 62    Alcohol Use: Yes      chardonnay mostly; 1-2 bottles of wine per day on average         Review of Systems - as noted above for pertinents. No anginal symptoms.     OBJECTIVE: BP 134/87  Pulse 96  Temp(Src) 36.3 C (97.3 F) (Tympanic)  Resp 18  SpO2 96%  He appears well, in no apparent respiratory distress.  Alert and oriented, pleasant and cooperative.    ENT: bilateral TM normal without fluid or infection, neck without nodes and throat normal without erythema or exudate.   Chest is clear to auscultation bilaterally with deep inspiration.  No wheezing, crackles or rhonchi. Normal symmetric air entry throughout both lung fields. Some wheezing noted post forced expiration. Colored sputum noted--brought in by patient   Heart exam shows regular rate and rhythm. No murmurs, rubs or gallops. No increased edema       Impression/Plan:      (466.0) Acute bronchitis  (primary encounter diagnosis)  Comment: with asthma exacerbation  Increased risk of complications   Plan:  medications per orders  Add flovent to advair   Continue albuterol  Consider chest x-ray, labs and oral steroids if not responding to above     (493.90) Moderate persistent asthma  Comment: continue advair and as needed albuterol as above   Plan: Montelukast (SINGULAIR) 10 mg Tablet            (333.94) Restless legs  Comment:   Plan: discussed unclear why symptoms worse recently  Recent labs ok, electrolytes improved and anemia better  Trial of mirapex at bedtime   Consider neurology consult       Barriers to Learning assessed: none. Patient verbalizes understanding of teaching and instructions.    Electronically Signed By:     Woodward Ku M.D.  Attending Physician  [facility]

## 2012-12-15 NOTE — Telephone Encounter (Signed)
From: [name]  To: Aurelio Jew, MD  Sent: 12/15/2012 9:32 AM PDT  Subject: Non-urgent Medical Advice Question    Hi,  I just tried to call.....no answer??? Phone problems??  If possible I really need to see Dr. Kirtland Bouchard today....my chest is getting worse.  Thank you  [name]

## 2012-12-22 ENCOUNTER — Ambulatory Visit: Admitting: Cardiovascular Disease

## 2012-12-22 ENCOUNTER — Encounter: Payer: Self-pay | Admitting: Cardiovascular Disease

## 2012-12-22 ENCOUNTER — Ambulatory Visit

## 2012-12-22 VITALS — BP 133/89 | HR 84 | Ht 70.0 in | Wt 251.4 lb

## 2012-12-22 VITALS — BP 133/89 | HR 84 | Wt 251.4 lb

## 2012-12-22 NOTE — Progress Notes (Signed)
RE:  FANNIE, ALOMAR  MR#:  [id number]  DOB:  Jul 20, 1951  Date of Service:  12/22/2012      Aurelio Jew, MD    Dear Dr. Felicita Gage:    Mr. Jalene Lacko returned to our Cardiology Clinic for evaluation of  his coronary artery disease and previous coronary artery bypass graft  surgery.  As you recall, he is a 62 year old gentleman from [place]  who had a long history of ischemic cardiac pain treated with  approximately 25 coronary stents implanted over a number of years.  After moving to [place] six years ago, the patient finally  underwent six-vessel coronary artery bypass graft surgery in 2011 at  [facility].  The LIMA was placed to the left anterior  descending coronary artery and a free right internal mammary artery  graft was placed to the diagonal branch.  Left radial artery graft was  placed to the circumflex and saphenous vein grafts were placed to the  posterolateral and posterior descending coronary artery as well as the  posterior ventricular branch.  Since that time, the patient has done  extremely well with no symptoms of angina.    In December 2013 the patient had hip replacement with an excellent  outcome and he is up and about and feeling better than he has in many  years.  He has absolutely no chest discomfort or angina pectoris or  myocardial infarction.  He denies dyspnea, orthopnea, or paroxysmal  nocturnal dyspnea.  He has no palpitations, dizziness or syncope.  He  complains of some right hand tingling and numbness.    His present medical regimen includes aspirin 5 gr daily, albuterol  inhaler as needed, allopurinol 300 mg daily, Augmentin for bronchitis,  Flovent inhaler for bronchitis, lisinopril, hydrochlorothiazide 20/25  1 daily, Singulair 10 mg daily, Protonix of 40 mg daily, Mirapex 0.125  mg 1-2 tablets h.s.,  Flonase inhaler as needed, Vytorin 10/80 daily,  Trilipix 1 tablet daily, atenolol 100 mg b.i.d., Pataday eye drops,  vitamin D 3 five tablets daily, and  additional over-the-counter  supplements.    Physical exam:  Pleasant, large, overweight male in no acute distress.  Blood pressure 133/89.  After 10 minutes his blood pressure of 130/85,  pulse 80, respirations 16, weight 251 pounds.  Chest clear.  Cardiovascular:  Jugular venous pressure 6 cm/H2O.  Carotid pulses  normal.  S4 gallop at the apex, 1/6 systolic murmur at the lower left  sternal border.  Extremities:  No edema.    Laboratory:  None recent.  Electrocardiogram:  Normal sinus rhythm,  left atrial enlargement, nonspecific ST-T wave changes.    Assessment:  1) Coronary artery disease:  Status post CABG x 09 November 2009 at  [facility] doing extremely well with no symptoms of  ischemia.  2) Prior percutaneous coronary artery intervention:  Twenty-five  stents implanted over 15 years from 1995 to 2010.  3) Hyperlipidemia:  Good lipid profile.  4) History of gout:  On allopurinol.  5) HLA:  B27 disease.  6) Status post hip replacement:  Excellent recovery.  7) Hypertension:  Reasonable control.  8) Tremor:  On beta-blockers.  9) Postoperative PAF:  Resolved.  10) Right hand tingling:  Likely neuropathy.  11) Easy bruisability:  Likely related aspirin, reduce dose.    Plan:  1)  Decrease aspirin to 81 mg daily.  2) Follow up in six months.  3) Laboratory studies soon, call for results.    I appreciate  this opportunity to continue in the care of Mr. Birdsall.  I am very impressed with his recovery following hip surgery.  His  heart is doing extremely well since coronary artery bypass graft  surgery in 2011.  Please contact me if you have any questions  regarding his visit today.    Sincerely,        Report Electronically Signed - 01/08/2013 17:01:39 by  Su Monks, MD  Professor And Chief  Internal Medicine  Cardiovascular Medicine    RIL/kc  D:  12/22/2012 13:47:47 PDT/PST  T:  12/22/2012 15:24:31 PDT/PST  Job #:  [id number] / [id number]

## 2012-12-22 NOTE — Nursing Note (Signed)
>>   Merceda Elks, LVN     Fri Dec 22, 2012 12:43 PM  pt's medication list updated with information provided by pt. Merceda Elks LVN    >> LATASHA AMOS     Fri Dec 22, 2012 12:35 PM  Vital signs taken, screened for pain, ekg performed, allergies and pharmacy verified. Judi Cong, MAI

## 2012-12-25 NOTE — Progress Notes (Signed)
This patient was seen, evaluated, and care plan was developed with the resident.  I agree with the assessment and plan as outlined in the resident's note.  I personally reviewed the image and agree with the resident's assessment.  Report electronically signed by Robi Mitter C Aixa Corsello, MD. Attending

## 2013-01-02 ENCOUNTER — Ambulatory Visit

## 2013-01-29 ENCOUNTER — Encounter: Payer: Self-pay | Admitting: Internal Medicine

## 2013-01-29 NOTE — Telephone Encounter (Signed)
From: [name]  To: Aurelio Jew, MD  Sent: 01/29/2013 7:32 AM PDT  Subject: Non-urgent Medical Advice Question    Hi Dr. Kirtland Bouchard,  This will be our last week in [facility] and I would very much like to come for 5 minutes and say goodbye. Whenever might be easiest for you will be fine with me.  [name] [employer]

## 2013-01-30 ENCOUNTER — Encounter: Payer: Self-pay | Admitting: Cardiovascular Disease

## 2013-01-30 ENCOUNTER — Telehealth: Payer: Self-pay | Admitting: Cardiovascular Disease

## 2013-01-30 NOTE — Telephone Encounter (Signed)
Dr. Low called patient.

## 2013-01-30 NOTE — Telephone Encounter (Signed)
From: [name]  To: Su Monks, MD  Sent: 01/29/2013 4:43 PM PDT  Subject: Non-urgent Medical Advice Question    Hi Dr. Rachell Cipro,  My wife [name] & me are moving back east...to [facility]. I would love to steal 60 seconds of your time to say goodbye....we leave on 5/6 and I have no idea if this is possible but it is definitely a priority for myself if there is any shot. I know it's short notice....but anytime between now and then works for me....again 60 seconds to thank a life saver.  Hopefully,  [name]

## 2013-01-30 NOTE — Telephone Encounter (Signed)
Forwarding message to Dr. Rachell Cipro and printing copy for triage folder as he is in clinic this afternoon.    Tomasa Blase, RN

## 2013-01-30 NOTE — Telephone Encounter (Signed)
Routed to MD.  Clayden Withem, RN, Cardiology & Nephrology

## 2013-01-30 NOTE — Telephone Encounter (Signed)
Pt came in to say goodbye to Dr. Rachell Cipro. Pt stated "Sorry I missed him, I took a long shot. I wanted to thank him for everything hes done, its been unforgettable. I will be in touch, I'm moving to [place], [place] next week. Again thanks for everything!"

## 2013-02-19 ENCOUNTER — Encounter: Payer: Self-pay | Admitting: Internal Medicine

## 2013-02-19 MED ORDER — ATENOLOL 100 MG TABLET
100.0000 mg | ORAL_TABLET | Freq: Two times a day (BID) | ORAL | Status: AC
Start: 2013-02-19 — End: 2013-03-22

## 2013-02-19 MED ORDER — ATENOLOL 100 MG TABLET
100.0000 mg | ORAL_TABLET | Freq: Two times a day (BID) | ORAL | Status: AC
Start: 2013-02-19 — End: 2014-02-19

## 2013-02-19 NOTE — Telephone Encounter (Signed)
From: [name]  To: Aurelio Jew, MD  Sent: 02/17/2013 5:00 AM PDT  Subject: Non-urgent Medical Advice Question    Dr. Kirtland Bouchard,  [name]'s e-mail is not working yet but she also needs a renewal. Carisoprodol Tab 350 mg...also via [facility] delivery.  many thanks,  [name]

## 2013-02-19 NOTE — Telephone Encounter (Signed)
From: [name]  To: Aurelio Jew, MD  Sent: 02/18/2013 4:54 PM PDT  Subject: Non-urgent Medical Advice Question    Hi,  Another thought regarding my Atenolol 100 mg.....Marland Kitchenmight you be able to call in an Rx to our [facility] [place]?? Just a 3 week supply (2/day) until we return from [place] (fishing :))  If it is possible the number is [phone]  Many thanks in advance,  [name]    P.S. HAPPY BIRTHDAY STEFANIE!!!....you youngster!

## 2013-02-19 NOTE — Telephone Encounter (Signed)
From: [name]  To: Aurelio Jew, MD  Sent: 02/17/2013 4:40 AM PDT  Subject: Non-urgent Medical Advice Question    Hi Dr. Kirtland Bouchard,  Long trip but we made it! New address is [address] [place], [place] [address]  [phone] ( C ).  I am completely out of Atenolol 100 mg.Marland KitchenMarland KitchenMarland KitchenI thought we updated everything on my last visit but this seems to have slipped through. As I am going to [place] fishing for a couple of weeks on Wednesday this would seem to be a disaster....but.Marland KitchenMarland KitchenI have an Rx of Metoprolol 100 mg from June 2013 that I can use in the meantime. Monia Pouch has the new address so if you would be kind enough to renew this Rx it would be greatly appreciated.  [name]

## 2013-05-03 ENCOUNTER — Encounter: Payer: Self-pay | Admitting: Internal Medicine

## 2013-05-03 MED ORDER — LORAZEPAM 0.5 MG TABLET
0.5000 mg | ORAL_TABLET | Freq: Three times a day (TID) | ORAL | Status: AC
Start: 2013-05-03 — End: 2014-05-03

## 2013-05-03 NOTE — Telephone Encounter (Signed)
Faxed the patient prescription on 05/03/13 at 430 pm  [name]  MA I

## 2013-05-03 NOTE — Telephone Encounter (Signed)
Refilled medication.  Please notify the patient.

## 2013-05-03 NOTE — Telephone Encounter (Signed)
From: [name]  To: Aurelio Jew, MD  Sent: 05/03/2013 10:37 AM PDT  Subject: Non-urgent Medical Advice Question    Hi Dr. Kirtland Bouchard!  I hope all is well with you....me....well I need to lose weight! I have yet to find a [facility] doctor but I have lined up a cardiologist for 8/28 and I am hoping for a referral from her. In the meantime my Lorazepam 0.5 mg tablet Rx is out and I am hoping you can [facility] [place][phone]. The Rx is for 150 tablets at 5/day for 30 days so I might need you again at the end of this month also. Please send regards to all. Missing you.  [name]

## 2013-05-31 ENCOUNTER — Ambulatory Visit (INDEPENDENT_AMBULATORY_CARE_PROVIDER_SITE_OTHER): Payer: Managed Care, Other (non HMO) | Admitting: Internal Medicine

## 2013-05-31 ENCOUNTER — Encounter: Payer: Self-pay | Admitting: Internal Medicine

## 2013-05-31 VITALS — BP 132/84 | HR 98 | Ht 70.0 in | Wt 250.0 lb

## 2013-05-31 DIAGNOSIS — R0602 Shortness of breath: Secondary | ICD-10-CM

## 2013-05-31 DIAGNOSIS — R079 Chest pain, unspecified: Secondary | ICD-10-CM

## 2013-05-31 MED ORDER — LORAZEPAM 0.5 MG PO TABS: ORAL_TABLET | ORAL | Status: DC

## 2013-05-31 NOTE — Patient Instructions (Addendum)
Please establish with primary care.   You have been referred to pulmonologist  Dr Shelle Iron or Dr Delton Coombes  Your physician wants you to follow-up in: 6 months You will receive a reminder letter in the mail two months in advance. If you don't receive a letter, please call our office to schedule the follow-up appointment.   Please have pt sign release of records for two DR's Sumner Boast MD [facility]  [facility] = Dr Rana Snare

## 2013-05-31 NOTE — Progress Notes (Addendum)
HPI Patient is a 62 yo who recently moved to [place] from [place].  Presents for continued cardiac care  The patient suffered MI in 11 in [place].  Had first stent in 1996 ([place])  On balloon pump after procedure per his report.  After that had 24 stents.   Moved to [place].  In 2011 underwent 6 vessel CABG.  Cardiologist Dr Rana Snare at [facility].  Operated on at nearby hospital (Dr Sumner Boast)  The patient had chest pressure/SOB in 2013.  Had a myoview stress test that showed no signif ischemia. Found to have COPD exacerbation.  Snce moving here he has slowly started back to exercising  Has treadmill.  Allergies  Allergen Reactions  . Keflex [Cephalexin]     rash    Current Outpatient Prescriptions  Medication Sig Dispense Refill  . ADVAIR DISKUS 250-50 MCG/DOSE AEPB 1 puff. 250 mcg in the am      . ALBUTEROL IN Inhale 90 mcg into the lungs as needed.      Marland Kitchen allopurinol (ZYLOPRIM) 300 MG tablet Take 300 mg by mouth daily.       . Ascorbic Acid (VITAMIN C) 100 MG tablet Take 400 mg by mouth daily.      Marland Kitchen aspirin 81 MG tablet Take 81 mg by mouth daily.      Marland Kitchen atenolol (TENORMIN) 100 MG tablet Take 100 mg by mouth 2 (two) times daily.       . Cholecalciferol (VITAMIN D-3) 5000 UNITS TABS Take 1 tablet by mouth daily.      . Choline Fenofibrate (FENOFIBRIC ACID) 135 MG CPDR Take 1 capsule by mouth daily.       . Coenzyme Q10 (CO Q-10) 100 MG CAPS Take 2 tablets by mouth daily.      . fluticasone (FLONASE) 50 MCG/ACT nasal spray Place 1 spray into the nose daily.       . fluticasone (FLOVENT HFA) 110 MCG/ACT inhaler Inhale 1 puff into the lungs 2 (two) times daily.      Marland Kitchen lisinopril-hydrochlorothiazide (PRINZIDE,ZESTORETIC) 20-25 MG per tablet Take 1 tablet by mouth daily.       Marland Kitchen LORazepam (ATIVAN) 0.5 MG tablet Take 0.5 mg by mouth daily. 1 tablet every 4 hours and 2 tablets in the PM @ bedtime.      . Magnesium Oxide (MAG-200 PO) Take 2 tablets by mouth daily.      . montelukast (SINGULAIR)  10 MG tablet Take 10 mg by mouth at bedtime.      . Multiple Vitamins-Minerals (CENTRUM SILVER ADULT 50+) TABS Take 1 tablet by mouth daily.      . Olopatadine HCl (PATADAY) 0.2 % SOLN Apply 1 drop to eye as needed.      . pantoprazole (PROTONIX) 40 MG tablet Take 40 mg by mouth 2 (two) times daily.       . pramipexole (MIRAPEX) 0.125 MG tablet Take 0.125 mg by mouth at bedtime as needed.      . vitamin B-12 (CYANOCOBALAMIN) 1000 MCG tablet Take 1,000 mcg by mouth daily.      Marland Kitchen VYTORIN 10-80 MG per tablet Take 1 tablet by mouth at bedtime.        No current facility-administered medications for this visit.    Past Medical History  Diagnosis Date  . Hypertension   . Coronary artery disease     x 6    No past surgical history on file.  No family history on file.  History  Social History  . Marital Status: Unknown    Spouse Name: N/A    Number of Children: N/A  . Years of Education: N/A   Occupational History  . Not on file.   Social History Main Topics  . Smoking status: Former Games developer  . Smokeless tobacco: Not on file  . Alcohol Use: Not on file  . Drug Use: Not on file  . Sexual Activity: Not on file   Other Topics Concern  . Not on file   Social History Narrative  . No narrative on file    Review of Systems:  All systems reviewed.  They are negative to the above problem except as previously stated.  Vital Signs: BP 132/84  Pulse 98  Ht 5\' 10"  (1.778 m)  Wt 250 lb (113.399 kg)  BMI 35.87 kg/m2  Physical Exam Patient is a morbidly obese pt in NAD HEENT:  Normocephalic, atraumatic. EOMI, PERRLA.  Neck: JVP is normal.  No bruits.  Lungs: clear to auscultation. No rales no wheezes.  Heart: Regular rate and rhythm. Normal S1, S2. No S3.   No significant murmurs. PMI not displaced.  Abdomen:  Supple, nontender. Normal bowel sounds. No masses. No hepatomegaly.  Extremities:   Good distal pulses throughout. No lower extremity edema.  Musculoskeletal :moving all  extremities.  Neuro:   alert and oriented x3.  CN II-XII grossly intact.  EKG  SR 98  Nonspecific ST T wave changes.   Assessment and Plan:  1  CAD  Patient will sign a release of info to get cath /op records  No symptoms to sugg angina.  Keep onsame regimen  Encouraged him to resume exercising  2.  HL  Will review outside lipids  3.  HTN  Good control  Follow  4.   Morbid obesity  Counselled on wt loss.   Addendum (07/12/11):  Outside records reviewed  1.  CAD  Reported 25 stents (15 to RCA; 10 to L system) CABG x 6 in 2011 at [facility], [place] ( LIMA to LAD; RIMA to Diag; L radial artery to LCx; SVG to PDA, SVG to PLSA; ? SVG to post vent branch)  Echo 2013;  Normal LV systolic function  No signif valvular dz Myoviw 2013:  Results not faxed  PMH:  CAD, HL (290 total at one point per patient), gout, HLA B27, Hip replacement, tremors, post op PAF, osteoarthrite, remote tobacco,   Dietrich Pates

## 2013-06-11 ENCOUNTER — Other Ambulatory Visit: Payer: Self-pay

## 2013-06-11 ENCOUNTER — Other Ambulatory Visit: Payer: Self-pay | Admitting: *Deleted

## 2013-06-11 MED ORDER — FENOFIBRIC ACID 135 MG PO CPDR: 1.0000 | DELAYED_RELEASE_CAPSULE | Freq: Every day | ORAL | Status: DC

## 2013-06-17 ENCOUNTER — Other Ambulatory Visit: Payer: Self-pay

## 2013-06-23 ENCOUNTER — Other Ambulatory Visit: Payer: Self-pay

## 2013-06-24 ENCOUNTER — Emergency Department (HOSPITAL_COMMUNITY)
Admission: EM | Admit: 2013-06-24 | Discharge: 2013-06-24 | Disposition: A | Discharge status: Home / Self Care | Payer: Managed Care, Other (non HMO) | Attending: Emergency Medicine | Admitting: Emergency Medicine

## 2013-06-24 ENCOUNTER — Encounter (HOSPITAL_COMMUNITY): Payer: Self-pay | Admitting: *Deleted

## 2013-06-24 DIAGNOSIS — J4489 Other specified chronic obstructive pulmonary disease: Secondary | ICD-10-CM | POA: Insufficient documentation

## 2013-06-24 DIAGNOSIS — I10 Essential (primary) hypertension: Secondary | ICD-10-CM | POA: Insufficient documentation

## 2013-06-24 DIAGNOSIS — Z79899 Other long term (current) drug therapy: Secondary | ICD-10-CM | POA: Insufficient documentation

## 2013-06-24 DIAGNOSIS — K047 Periapical abscess without sinus: Secondary | ICD-10-CM | POA: Insufficient documentation

## 2013-06-24 DIAGNOSIS — J449 Chronic obstructive pulmonary disease, unspecified: Secondary | ICD-10-CM | POA: Insufficient documentation

## 2013-06-24 DIAGNOSIS — R220 Localized swelling, mass and lump, head: Secondary | ICD-10-CM | POA: Insufficient documentation

## 2013-06-24 DIAGNOSIS — K029 Dental caries, unspecified: Secondary | ICD-10-CM | POA: Insufficient documentation

## 2013-06-24 DIAGNOSIS — I2510 Atherosclerotic heart disease of native coronary artery without angina pectoris: Secondary | ICD-10-CM | POA: Insufficient documentation

## 2013-06-24 DIAGNOSIS — Z87891 Personal history of nicotine dependence: Secondary | ICD-10-CM | POA: Insufficient documentation

## 2013-06-24 DIAGNOSIS — Z7982 Long term (current) use of aspirin: Secondary | ICD-10-CM | POA: Insufficient documentation

## 2013-06-24 DIAGNOSIS — Z951 Presence of aortocoronary bypass graft: Secondary | ICD-10-CM | POA: Insufficient documentation

## 2013-06-24 HISTORY — DX: Chronic obstructive pulmonary disease, unspecified: J44.9

## 2013-06-24 MED ORDER — SODIUM CHLORIDE 0.9 % IV SOLN
Freq: Once | INTRAVENOUS | Status: AC
  Administered 2013-06-24: 10:00:00 via INTRAVENOUS

## 2013-06-24 MED ORDER — CLINDAMYCIN HCL 150 MG PO CAPS: 300.0000 mg | ORAL_CAPSULE | Freq: Three times a day (TID) | ORAL | Status: DC

## 2013-06-24 MED ORDER — CLINDAMYCIN PHOSPHATE 600 MG/50ML IV SOLN
600.0000 mg | Freq: Once | INTRAVENOUS | Status: AC
  Administered 2013-06-24: 600 mg via INTRAVENOUS
  Filled 2013-06-24: qty 50

## 2013-06-24 MED ORDER — HYDROCODONE-ACETAMINOPHEN 5-325 MG PO TABS
2.0000 | ORAL_TABLET | Freq: Once | ORAL | Status: AC
  Administered 2013-06-24: 2 via ORAL
  Filled 2013-06-24: qty 2

## 2013-06-24 MED ORDER — HYDROCODONE-ACETAMINOPHEN 5-325 MG PO TABS: ORAL_TABLET | ORAL | Status: DC

## 2013-06-24 NOTE — ED Notes (Signed)
Pt c/o right upper tooth pain x 3 days. Swelling to right face noted.

## 2013-06-24 NOTE — ED Provider Notes (Signed)
CSN: [id number]     Arrival date & time 06/24/13  6578 History  This chart was scribed for non-physician practitioner Junius Finner, PA-C working with Shanna Cisco, MD by Dorothey Baseman, ED Scribe. This patient was seen in room TR07C/TR07C and the patient's care was started at 9:57 AM.   Chief Complaint  Patient presents with  . Dental Pain   The history is provided by the patient. No language interpreter was used.   HPI Comments: Adam [name] is a 62 y.o. male who presents to the Emergency Department complaining of upper right-sided dental pain, described as an aching throbbing that is 10/10, with associated facial swelling onset 3 days ago that has been progressively worsening and is exacerbated when laying down. He reports that he tried to make an appointment with dentist on Friday, 9/19, but none of the dental offices were open and states pain was so severe he could not wait until tomorrow. He reports feeling feverish, but has not measured his temperature. He reports taking tylenol with codeine at home with mild, temporary relief, but states that it made him feel nauseous. He denies difficulty swallowing, vomiting, or any other symptoms at this time. Patient reports that he does not currently have a dentist due to a recent move to the area. He denies history of dental abscesses. He reports that he is allergic to Keflex.   Past Medical History  Diagnosis Date  . Hypertension   . Coronary artery disease     x 6  . COPD (chronic obstructive pulmonary disease)    Past Surgical History  Procedure Laterality Date  . Coronary angioplasty with stent placement    . Coronary artery bypass graft    . Appendectomy    . Total hip arthroplasty     History reviewed. No pertinent family history. History  Substance Use Topics  . Smoking status: Former Games developer  . Smokeless tobacco: Never Used  . Alcohol Use: Yes     Comment: 1.5 bottles of wine weekly    Review of Systems  Constitutional:  Negative for fever.  HENT: Positive for facial swelling and dental problem. Negative for trouble swallowing.   Gastrointestinal: Negative for nausea and vomiting.  All other systems reviewed and are negative.    Allergies  Keflex  Home Medications   Current Outpatient Rx  Name  Route  Sig  Dispense  Refill  . ADVAIR DISKUS 250-50 MCG/DOSE AEPB      1 puff. 250 mcg in the am         . allopurinol (ZYLOPRIM) 300 MG tablet   Oral   Take 300 mg by mouth daily.          . Ascorbic Acid (VITAMIN C) 100 MG tablet   Oral   Take 400 mg by mouth daily.         Marland Kitchen aspirin 81 MG tablet   Oral   Take 81 mg by mouth daily.         Marland Kitchen atenolol (TENORMIN) 100 MG tablet   Oral   Take 100 mg by mouth 2 (two) times daily.          . Cholecalciferol (VITAMIN D-3) 5000 UNITS TABS   Oral   Take 1 tablet by mouth daily.         . Choline Fenofibrate (FENOFIBRIC ACID) 135 MG CPDR   Oral   Take 1 capsule by mouth daily.   30 capsule   0   . Coenzyme Q10 (  CO Q-10) 100 MG CAPS   Oral   Take 2 tablets by mouth daily.         . fluticasone (FLONASE) 50 MCG/ACT nasal spray   Nasal   Place 1 spray into the nose daily.          . fluticasone (FLOVENT HFA) 110 MCG/ACT inhaler   Inhalation   Inhale 1 puff into the lungs 2 (two) times daily.         Marland Kitchen lisinopril-hydrochlorothiazide (PRINZIDE,ZESTORETIC) 20-25 MG per tablet   Oral   Take 1 tablet by mouth daily.          Marland Kitchen LORazepam (ATIVAN) 0.5 MG tablet      1 tablet every 4 hours and 2 tablets in the PM @ bedtime.   150 tablet   1   . Magnesium Oxide (MAG-200 PO)   Oral   Take 2 tablets by mouth daily.         . montelukast (SINGULAIR) 10 MG tablet   Oral   Take 10 mg by mouth at bedtime.         . Multiple Vitamins-Minerals (CENTRUM SILVER ADULT 50+) TABS   Oral   Take 1 tablet by mouth daily.         . Olopatadine HCl (PATADAY) 0.2 % SOLN   Ophthalmic   Apply 1 drop to eye as needed.          . pantoprazole (PROTONIX) 40 MG tablet   Oral   Take 40 mg by mouth 2 (two) times daily.          . pramipexole (MIRAPEX) 0.125 MG tablet   Oral   Take 0.125 mg by mouth at bedtime as needed.         . vitamin B-12 (CYANOCOBALAMIN) 1000 MCG tablet   Oral   Take 1,000 mcg by mouth daily.         Marland Kitchen VYTORIN 10-80 MG per tablet   Oral   Take 1 tablet by mouth at bedtime.          . clindamycin (CLEOCIN) 150 MG capsule   Oral   Take 2 capsules (300 mg total) by mouth 3 (three) times daily. May dispense as 150mg  capsules   60 capsule   0   . HYDROcodone-acetaminophen (NORCO/VICODIN) 5-325 MG per tablet      Take 1-2 pills every 4-6 hours as needed for pain.   15 tablet   0    Triage Vitals: BP 159/98  Pulse 100  Temp(Src) 97 F (36.1 C) (Oral)  Resp 18  Wt 250 lb (113.399 kg)  SpO2 97%  Physical Exam  Nursing note and vitals reviewed. Constitutional: He is oriented to person, place, and time. He appears well-developed and well-nourished.  HENT:  Head: Normocephalic and atraumatic.  Right Ear: External ear normal.  Left Ear: External ear normal.  Nose: Nose normal.  Mouth/Throat: Uvula is midline, oropharynx is clear and moist and mucous membranes are normal. He does not have dentures. No oral lesions. No trismus in the jaw. Abnormal dentition. Dental abscesses and dental caries present. No edematous or lacerations. No oropharyngeal exudate, posterior oropharyngeal edema, posterior oropharyngeal erythema or tonsillar abscesses.    Moderate edema to the right cheek that is tender to palpation with mild induration. Right upper gingiva is tender to palpation. Multiple dental carries at various stages. Multiple dental crowns. Oropharynx is clear. No peritonsillar abscess.   Eyes: EOM are normal.  Neck: Normal range of  motion.  Cardiovascular: Normal rate, regular rhythm and normal heart sounds.   Pulmonary/Chest: Effort normal. No respiratory distress.   Musculoskeletal: Normal range of motion.  Neurological: He is alert and oriented to person, place, and time.  Skin: Skin is warm and dry.  Psychiatric: He has a normal mood and affect. His behavior is normal.    ED Course  Procedures (including critical care time)  Medications  clindamycin (CLEOCIN) IVPB 600 mg (0 mg Intravenous Stopped 06/24/13 1100)  HYDROcodone-acetaminophen (NORCO/VICODIN) 5-325 MG per tablet 2 tablet (2 tablets Oral Given 06/24/13 1019)  0.9 %  sodium chloride infusion ( Intravenous Stopped 06/24/13 1118)   DIAGNOSTIC STUDIES: Oxygen Saturation is 97% on room air, normal by my interpretation.    COORDINATION OF CARE: 10:02AM- Will order IV clindamycin and oral Vicodin to manage pain symptoms. Discussed that an incision and drainage of the abscess will not be performed today in the ED. Advised patient to follow up with a dentist or to return to the ED if there are any new or worsening symptoms. Discussed treatment plan with patient at bedside and patient verbalized agreement.     Labs Review Labs Reviewed - No data to display Imaging Review No results found.  MDM   1. Dental abscess    Pt has abscess in right cheek, likely due to multiple dental carries.  No obvious point for I&D of abscess in gingiva at this time. Will tx with IV clindamycin and vicodin then discharge home to f/u with oral surgery, Dr. Sandford Craze. Rx: clindamycin and norco. Advised pt to call office first thing in the morning to make an appointment asap.  Return precautions provided. Pt verbalized understanding and agreement with tx plan. Vitals: unremarkable. Discharged in stable condition.     I personally performed the services described in this documentation, which was scribed in my presence. The recorded information has been reviewed and is accurate.     Junius Finner, PA-C 06/24/13 1610

## 2013-06-24 NOTE — ED Provider Notes (Signed)
Medical screening examination/treatment/procedure(s) were performed by non-physician practitioner and as supervising physician I was immediately available for consultation/collaboration.   Megan E Docherty, MD 06/24/13 1616 

## 2013-06-25 ENCOUNTER — Ambulatory Visit: Payer: Managed Care, Other (non HMO) | Admitting: Family Medicine

## 2013-06-26 ENCOUNTER — Ambulatory Visit: Admitting: Cardiovascular Disease

## 2013-06-26 ENCOUNTER — Encounter: Payer: Self-pay | Admitting: Family Medicine

## 2013-06-26 ENCOUNTER — Ambulatory Visit (INDEPENDENT_AMBULATORY_CARE_PROVIDER_SITE_OTHER): Payer: Managed Care, Other (non HMO) | Admitting: Family Medicine

## 2013-06-26 VITALS — BP 132/80 | HR 95 | Temp 98.2°F | Ht 70.0 in | Wt 250.0 lb

## 2013-06-26 DIAGNOSIS — J3089 Other allergic rhinitis: Secondary | ICD-10-CM

## 2013-06-26 DIAGNOSIS — K219 Gastro-esophageal reflux disease without esophagitis: Secondary | ICD-10-CM

## 2013-06-26 DIAGNOSIS — Z0000 Encounter for general adult medical examination without abnormal findings: Secondary | ICD-10-CM

## 2013-06-26 DIAGNOSIS — F4310 Post-traumatic stress disorder, unspecified: Secondary | ICD-10-CM | POA: Insufficient documentation

## 2013-06-26 DIAGNOSIS — J45909 Unspecified asthma, uncomplicated: Secondary | ICD-10-CM

## 2013-06-26 DIAGNOSIS — F1010 Alcohol abuse, uncomplicated: Secondary | ICD-10-CM | POA: Insufficient documentation

## 2013-06-26 DIAGNOSIS — J4530 Mild persistent asthma, uncomplicated: Secondary | ICD-10-CM

## 2013-06-26 DIAGNOSIS — J309 Allergic rhinitis, unspecified: Secondary | ICD-10-CM

## 2013-06-26 DIAGNOSIS — M109 Gout, unspecified: Secondary | ICD-10-CM

## 2013-06-26 DIAGNOSIS — G2581 Restless legs syndrome: Secondary | ICD-10-CM

## 2013-06-26 DIAGNOSIS — Z860100 Personal history of colon polyps, unspecified: Secondary | ICD-10-CM | POA: Insufficient documentation

## 2013-06-26 DIAGNOSIS — I2510 Atherosclerotic heart disease of native coronary artery without angina pectoris: Secondary | ICD-10-CM

## 2013-06-26 DIAGNOSIS — I152 Hypertension secondary to endocrine disorders: Secondary | ICD-10-CM | POA: Insufficient documentation

## 2013-06-26 DIAGNOSIS — R7309 Other abnormal glucose: Secondary | ICD-10-CM

## 2013-06-26 DIAGNOSIS — E669 Obesity, unspecified: Secondary | ICD-10-CM | POA: Insufficient documentation

## 2013-06-26 DIAGNOSIS — Z1589 Genetic susceptibility to other disease: Secondary | ICD-10-CM

## 2013-06-26 DIAGNOSIS — I10 Essential (primary) hypertension: Secondary | ICD-10-CM | POA: Insufficient documentation

## 2013-06-26 DIAGNOSIS — E1169 Type 2 diabetes mellitus with other specified complication: Secondary | ICD-10-CM | POA: Insufficient documentation

## 2013-06-26 DIAGNOSIS — E785 Hyperlipidemia, unspecified: Secondary | ICD-10-CM

## 2013-06-26 DIAGNOSIS — F1020 Alcohol dependence, uncomplicated: Secondary | ICD-10-CM

## 2013-06-26 DIAGNOSIS — Z8679 Personal history of other diseases of the circulatory system: Secondary | ICD-10-CM | POA: Insufficient documentation

## 2013-06-26 DIAGNOSIS — M1990 Unspecified osteoarthritis, unspecified site: Secondary | ICD-10-CM

## 2013-06-26 DIAGNOSIS — R7303 Prediabetes: Secondary | ICD-10-CM

## 2013-06-26 NOTE — Progress Notes (Signed)
Subjective:    Patient ID: Adam [name], male    DOB: July 11, 1951, 62 y.o.   MRN: [id number]  HPI Patient is seen to establish care Complicated past medical history. He and his wife just moved here from [place]. He is retired from [employer]. Has acute issue of right dental abscess and has surgery scheduled for tooth extraction later today. Currently on clindamycin. Because of this, he defers flu vaccine at this time.  Chronic medical problems include history of asthma, alcohol dependence, osteoarthritis, history of posttraumatic stress disorder, reported COPD, GERD with Barrett's changes, perennial and seasonal allergies, coronary artery disease, hypertension, dyslipidemia, colon polyps, gout, restless leg syndrome, transient atrial fibrillation following bypass surgery, prediabetes, positive HLA-B27.  He is on multiple medications and these are reviewed. Patient reports he's had 25 previous cardiac stents dating back to 1995. He eventually underwent CABG times 11/09/2009 and has done well since then. He is already established with local cardiologist. He's had 6 prior colonoscopies most recent 2013.  Patient had Pneumovax 2008. Tetanus 2011.  Past Medical History  Diagnosis Date  . Hypertension   . Coronary artery disease     x 6  . COPD (chronic obstructive pulmonary disease)   . Asthma   . Arthritis   . Depression   . GERD (gastroesophageal reflux disease)   . Allergy   . Hyperlipidemia   . Family history of polyps in the colon   . Anxiety     history of PTSD following CABG  . Gout   . Prediabetes   . H/O atrial fibrillation without current medication     following CABG with no documented episodes since then.  . RLS (restless legs syndrome)    Past Surgical History  Procedure Laterality Date  . Coronary angioplasty with stent placement    . Coronary artery bypass graft    . Appendectomy    . Total hip arthroplasty      reports that he has quit smoking. He has  never used smokeless tobacco. He reports that  drinks alcohol. He reports that he does not use illicit drugs. family history includes Cancer in his mother; Heart disease in his paternal grandfather and paternal grandmother. Allergies  Allergen Reactions  . Keflex [Cephalexin]     rash        Review of Systems  Constitutional: Negative for appetite change, fatigue and unexpected weight change.  HENT: Negative for sore throat.   Eyes: Negative for visual disturbance.  Respiratory: Negative for cough, chest tightness and shortness of breath.   Cardiovascular: Negative for chest pain, palpitations and leg swelling.  Gastrointestinal: Negative for nausea, vomiting, abdominal pain, diarrhea and constipation.  Endocrine: Negative for polydipsia and polyuria.  Genitourinary: Negative for dysuria.  Skin: Negative for rash.  Neurological: Negative for dizziness, syncope, weakness, light-headedness and headaches.  Hematological: Negative for adenopathy. Does not bruise/bleed easily.  Psychiatric/Behavioral: Negative for confusion.       Objective:   Physical Exam  Constitutional: He appears well-developed and well-nourished.  HENT:  Right Ear: External ear normal.  Left Ear: External ear normal.  Patient has some mild right facial swelling right cheek region from recent dental abscess  Neck: Neck supple. No thyromegaly present.  Cardiovascular: Normal rate and regular rhythm.   Pulmonary/Chest: Effort normal and breath sounds normal. No respiratory distress. He has no wheezes. He has no rales.  Abdominal: Soft. Bowel sounds are normal. He exhibits no distension. There is no tenderness. There is no rebound and no  guarding.  Musculoskeletal: He exhibits no edema.  Lymphadenopathy:    He has no cervical adenopathy.  Neurological: He is alert.  Psychiatric: He has a normal mood and affect. His behavior is normal.          Assessment & Plan:  #1 history of CAD. Already established  with local cardiologist. Maryclare Labrador plan fasting lipids soon. He had previous bypass grafting back in 2011.  Pt will schedule CPE. #2 hypertension well controlled #3 dyslipidemia. Fasting labs with physical in a couple months #4 history of GERD with reported Barrett's esophagus changes. We'll eventually need to establish with local gastroenterologist #5 history of seasonal and perennial allergies which are stable #6 history of colon polyps #7 asthma which is stable on Advair and Singulair #8 history of osteoarthritis with previous left total hip replacement December 2013 #9 history of gout stable on allopurinol with no recent flareups #10 restless leg syndrome which is stable on low-dose Mirapex #11 prediabetes. Check A1C with follow up labs. #12  Alcohol dependence currently drinking over 1 bottle of wine/day.  Discussed.adverse issues with excessive ETOH but current motivation low to scale back.

## 2013-07-05 ENCOUNTER — Encounter: Payer: Self-pay | Admitting: Pulmonary Disease

## 2013-07-05 ENCOUNTER — Ambulatory Visit (INDEPENDENT_AMBULATORY_CARE_PROVIDER_SITE_OTHER): Payer: Managed Care, Other (non HMO) | Admitting: Pulmonary Disease

## 2013-07-05 VITALS — BP 128/82 | HR 97 | Temp 97.0°F | Ht 70.0 in | Wt 247.6 lb

## 2013-07-05 DIAGNOSIS — J441 Chronic obstructive pulmonary disease with (acute) exacerbation: Secondary | ICD-10-CM | POA: Insufficient documentation

## 2013-07-05 DIAGNOSIS — J449 Chronic obstructive pulmonary disease, unspecified: Secondary | ICD-10-CM | POA: Insufficient documentation

## 2013-07-05 DIAGNOSIS — Z23 Encounter for immunization: Secondary | ICD-10-CM

## 2013-07-05 NOTE — Progress Notes (Signed)
  Subjective:    Patient ID: Adam [name], male    DOB: [birth year], 62 y.o.   MRN: [id number]  HPI The patient is a 62 year old male who has been asked to see for management of COPD.  The patient tells me he was diagnosed with COPD a year or so ago, and although PFTs are not available for review, he does have an office visit note from his prior pulmonologist who describes mild airflow obstruction.  The patient has a long-standing history of smoking, but has not done so since 1979.  He is currently on Advair, and for some reason fluticasone.  He also takes Singulair as needed for allergies.  The patient also has underlying coronary disease with percutaneous intervention and CABG in the past.  The patient states that he has fairly good exercise tolerance, and can walk for miles on flat ground.  However, he can have shortness of breath with heavy exertional activities such as hills or multiple flights of stairs.  He also gets short of breath bending over.  He denies any significant cough or mucus production, and has not had any lower extremity edema.  I do see a chest x-ray from last year that shows only decreased lung volumes with some minimal basilar atelectasis.   Review of Systems  Constitutional: Negative for fever and unexpected weight change.  HENT: Positive for congestion and dental problem. Negative for ear pain, nosebleeds, sore throat, rhinorrhea, sneezing, trouble swallowing, postnasal drip and sinus pressure.   Eyes: Negative for redness and itching.  Respiratory: Positive for cough and shortness of breath. Negative for chest tightness and wheezing.   Cardiovascular: Negative for palpitations and leg swelling.  Gastrointestinal: Negative for nausea and vomiting.  Genitourinary: Negative for dysuria.  Musculoskeletal: Negative for joint swelling.  Skin: Negative for rash.  Neurological: Negative for headaches.  Hematological: Does not bruise/bleed easily.  Psychiatric/Behavioral:  Negative for dysphoric mood. The patient is nervous/anxious.        Objective:   Physical Exam Constitutional:  Obese male, no acute distress  HENT:  Nares patent without discharge  Oropharynx without exudate but thrush on tongue, palate and uvula are mildly elongated.  Eyes:  Perrla, eomi, no scleral icterus  Neck:  No JVD, no TMG  Cardiovascular:  Normal rate, regular rhythm, no rubs or gallops.  No murmurs        Intact distal pulses  Pulmonary :  Normal breath sounds, no stridor or respiratory distress   No rales, rhonchi, or wheezing  Abdominal:  Soft, nondistended, bowel sounds present.  No tenderness noted.   Musculoskeletal:  No lower extremity edema noted.  Lymph Nodes:  No cervical lymphadenopathy noted  Skin:  No cyanosis noted  Neurologic:  Alert, appropriate, moves all 4 extremities without obvious deficit.         Assessment & Plan:

## 2013-07-05 NOTE — Patient Instructions (Addendum)
Stay on advair, but stop flovent (fluticasone) Keep mouth well rinsed after using inhaler.  If you continue to have thrush issues, can try something different. Let me know. Work on weight loss and conditioning. Will give you the flu shot today. Would like to see you back in 6mos, but please call if having issues.   If doing well, can spread out to yearly.

## 2013-07-05 NOTE — Assessment & Plan Note (Signed)
The patient has been told that he has mild COPD by pulmonary function studies, but I do not have his breathing studies for review.  I do have his prior pulmonologist's letter to the patient that does mention mild obstruction.  He has excellent exercise tolerance except with very heavy exertional activities, and also with bending over (probably due to his centripetal obesity).  The good news here is that he has quit smoking, and should do very well from a pulmonary standpoint with only mild disease.  I have stressed to him the importance of aggressive weight loss and conditioning, and how this would greatly improve his exertional tolerance.

## 2013-07-07 ENCOUNTER — Other Ambulatory Visit: Payer: Self-pay

## 2013-07-09 ENCOUNTER — Other Ambulatory Visit: Payer: Self-pay | Admitting: Internal Medicine

## 2013-07-12 ENCOUNTER — Encounter: Payer: Self-pay | Admitting: Family Medicine

## 2013-07-16 ENCOUNTER — Ambulatory Visit (INDEPENDENT_AMBULATORY_CARE_PROVIDER_SITE_OTHER): Payer: Managed Care, Other (non HMO) | Admitting: Family Medicine

## 2013-07-16 ENCOUNTER — Encounter: Payer: Self-pay | Admitting: Family Medicine

## 2013-07-16 VITALS — BP 128/78 | HR 91 | Temp 97.6°F | Wt 244.0 lb

## 2013-07-16 DIAGNOSIS — Z0000 Encounter for general adult medical examination without abnormal findings: Secondary | ICD-10-CM

## 2013-07-16 DIAGNOSIS — R631 Polydipsia: Secondary | ICD-10-CM

## 2013-07-16 DIAGNOSIS — R350 Frequency of micturition: Secondary | ICD-10-CM

## 2013-07-16 DIAGNOSIS — E1165 Type 2 diabetes mellitus with hyperglycemia: Secondary | ICD-10-CM

## 2013-07-16 DIAGNOSIS — E119 Type 2 diabetes mellitus without complications: Secondary | ICD-10-CM

## 2013-07-16 LAB — POCT URINALYSIS DIPSTICK
Nitrite, UA: NEGATIVE
Protein, UA: NEGATIVE
Spec Grav, UA: <=1.005
Urobilinogen, UA: 0.2
pH, UA: 5

## 2013-07-16 LAB — GLUCOSE, POCT (MANUAL RESULT ENTRY): POC Glucose: HIGH mg/dl (ref 70–99)

## 2013-07-16 NOTE — Patient Instructions (Signed)
Type 2 Diabetes Mellitus, Adult Type 2 diabetes mellitus, often simply referred to as type 2 diabetes, is a long-lasting (chronic) disease. In type 2 diabetes, the pancreas does not make enough insulin (a hormone), the cells are less responsive to the insulin that is made (insulin resistance), or both. Normally, insulin moves sugars from food into the tissue cells. The tissue cells use the sugars for energy. The lack of insulin or the lack of normal response to insulin causes excess sugars to build up in the blood instead of going into the tissue cells. As a result, high blood sugar (hyperglycemia) develops. The effect of high sugar (glucose) levels can cause many complications. Type 2 diabetes was also previously called adult-onset diabetes but it can occur at any age.  RISK FACTORS  A person is predisposed to developing type 2 diabetes if someone in the family has the disease and also has one or more of the following primary risk factors:  Overweight.  An inactive lifestyle.  A history of consistently eating high-calorie foods. Maintaining a normal weight and regular physical activity can reduce the chance of developing type 2 diabetes. SYMPTOMS  A person with type 2 diabetes may not show symptoms initially. The symptoms of type 2 diabetes appear slowly. The symptoms include:  Increased thirst (polydipsia).  Increased urination (polyuria).  Increased urination during the night (nocturia).  Weight loss. This weight loss may be rapid.  Frequent, recurring infections.  Tiredness (fatigue).  Weakness.  Vision changes, such as blurred vision.  Fruity smell to your breath.  Abdominal pain.  Nausea or vomiting.  Cuts or bruises which are slow to heal.  Tingling or numbness in the hands or feet. DIAGNOSIS Type 2 diabetes is frequently not diagnosed until complications of diabetes are present. Type 2 diabetes is diagnosed when symptoms or complications are present and when blood  glucose levels are increased. Your blood glucose level may be checked by one or more of the following blood tests:  A fasting blood glucose test. You will not be allowed to eat for at least 8 hours before a blood sample is taken.  A random blood glucose test. Your blood glucose is checked at any time of the day regardless of when you ate.  A hemoglobin A1c blood glucose test. A hemoglobin A1c test provides information about blood glucose control over the previous 3 months.  An oral glucose tolerance test (OGTT). Your blood glucose is measured after you have not eaten (fasted) for 2 hours and then after you drink a glucose-containing beverage. TREATMENT   You may need to take insulin or diabetes medicine daily to keep blood glucose levels in the desired range.  You will need to match insulin dosing with exercise and healthy food choices. The treatment goal is to maintain the before meal blood sugar (preprandial glucose) level at 70 130 mg/dL. HOME CARE INSTRUCTIONS   Have your hemoglobin A1c level checked twice a year.  Perform daily blood glucose monitoring as directed by your caregiver.  Monitor urine ketones when you are ill and as directed by your caregiver.  Take your diabetes medicine or insulin as directed by your caregiver to maintain your blood glucose levels in the desired range.  Never run out of diabetes medicine or insulin. It is needed every day.  Adjust insulin based on your intake of carbohydrates. Carbohydrates can raise blood glucose levels but need to be included in your diet. Carbohydrates provide vitamins, minerals, and fiber which are an essential part of   a healthy diet. Carbohydrates are found in fruits, vegetables, whole grains, dairy products, legumes, and foods containing added sugars.    Eat healthy foods. Alternate 3 meals with 3 snacks.  Lose weight if overweight.  Carry a medical alert card or wear your medical alert jewelry.  Carry a 15 gram  carbohydrate snack with you at all times to treat low blood glucose (hypoglycemia). Some examples of 15 gram carbohydrate snacks include:  Glucose tablets, 3 or 4   Glucose gel, 15 gram tube  Raisins, 2 tablespoons (24 grams)  Jelly beans, 6  Animal crackers, 8  Regular pop, 4 ounces (120 mL)  Gummy treats, 9  Recognize hypoglycemia. Hypoglycemia occurs with blood glucose levels of 70 mg/dL and below. The risk for hypoglycemia increases when fasting or skipping meals, during or after intense exercise, and during sleep. Hypoglycemia symptoms can include:  Tremors or shakes.  Decreased ability to concentrate.  Sweating.  Increased heart rate.  Headache.  Dry mouth.  Hunger.  Irritability.  Anxiety.  Restless sleep.  Altered speech or coordination.  Confusion.  Treat hypoglycemia promptly. If you are alert and able to safely swallow, follow the 15:15 rule:  Take 15 20 grams of rapid-acting glucose or carbohydrate. Rapid-acting options include glucose gel, glucose tablets, or 4 ounces (120 mL) of fruit juice, regular soda, or low fat milk.  Check your blood glucose level 15 minutes after taking the glucose.  Take 15 20 grams more of glucose if the repeat blood glucose level is still 70 mg/dL or below.  Eat a meal or snack within 1 hour once blood glucose levels return to normal.    Be alert to polyuria and polydipsia which are early signs of hyperglycemia. An early awareness of hyperglycemia allows for prompt treatment. Treat hyperglycemia as directed by your caregiver.  Engage in at least 150 minutes of moderate-intensity physical activity a week, spread over at least 3 days of the week or as directed by your caregiver. In addition, you should engage in resistance exercise at least 2 times a week or as directed by your caregiver.  Adjust your medicine and food intake as needed if you start a new exercise or sport.  Follow your sick day plan at any time you  are unable to eat or drink as usual.  Avoid tobacco use.  Limit alcohol intake to no more than 1 drink per day for nonpregnant women and 2 drinks per day for men. You should drink alcohol only when you are also eating food. Talk with your caregiver whether alcohol is safe for you. Tell your caregiver if you drink alcohol several times a week.  Follow up with your caregiver regularly.  Schedule an eye exam soon after the diagnosis of type 2 diabetes and then annually.  Perform daily skin and foot care. Examine your skin and feet daily for cuts, bruises, redness, nail problems, bleeding, blisters, or sores. A foot exam by a caregiver should be done annually.  Brush your teeth and gums at least twice a day and floss at least once a day. Follow up with your dentist regularly.  Share your diabetes management plan with your workplace or school.  Stay up-to-date with immunizations.  Learn to manage stress.  Obtain ongoing diabetes education and support as needed.  Participate in, or seek rehabilitation as needed to maintain or improve independence and quality of life. Request a physical or occupational therapy referral if you are having foot or hand numbness or difficulties with grooming,   dressing, eating, or physical activity. SEEK MEDICAL CARE IF:   You are unable to eat food or drink fluids for more than 6 hours.  You have nausea and vomiting for more than 6 hours.  Your blood glucose level is over 240 mg/dL.  There is a change in mental status.  You develop an additional serious illness.  You have diarrhea for more than 6 hours.  You have been sick or have had a fever for a couple of days and are not getting better.  You have pain during any physical activity.  SEEK IMMEDIATE MEDICAL CARE IF:  You have difficulty breathing.  You have moderate to large ketone levels. MAKE SURE YOU:  Understand these instructions.  Will watch your condition.  Will get help right away if  you are not doing well or get worse. Document Released: 09/20/2005 Document Revised: 06/14/2012 Document Reviewed: 04/18/2012 [facility] Patient Information 2014 [place], [place].  Drinking of water. Avoid sugar-containing beverages. Continue to scale back alcohol use. Return tomorrow morning for fasting blood sugar

## 2013-07-16 NOTE — Progress Notes (Signed)
  Subjective:    Patient ID: Adam [name], male    DOB: 20-Jul-1951, 62 y.o.   MRN: [id number]  HPI patient just recently established care. He has complex past history with restless leg syndrome, history prediabetes, osteoarthritis, obesity, history of atrial fibrillation, CAD, hypertension, dyslipidemia.  Presents now with symptoms of urine frequency, weight loss, and dry mouth over the past few weeks. He's had reported history prediabetes. Does not monitor blood sugars. Denies any urinary obstructive symptoms. No burning with urination. He is having increasing nocturia and urgency. No chest pain or dyspnea.  Past Medical History  Diagnosis Date  . Hypertension   . Coronary artery disease     x 6  . COPD (chronic obstructive pulmonary disease)   . Asthma   . Arthritis   . Depression   . GERD (gastroesophageal reflux disease)   . Allergy   . Hyperlipidemia   . Family history of polyps in the colon   . Anxiety     history of PTSD following CABG  . Gout   . Prediabetes   . H/O atrial fibrillation without current medication     following CABG with no documented episodes since then.  . RLS (restless legs syndrome)    Past Surgical History  Procedure Laterality Date  . Coronary angioplasty with stent placement    . Coronary artery bypass graft    . Appendectomy    . Total hip arthroplasty      reports that he quit smoking about 35 years ago. His smoking use included Cigarettes. He has a 20 pack-year smoking history. He has never used smokeless tobacco. He reports that he drinks alcohol. He reports that he does not use illicit drugs. family history includes Cancer in his mother; Heart disease in his paternal grandfather and paternal grandmother. Allergies  Allergen Reactions  . Clindamycin/Lincomycin   . Keflex [Cephalexin]     rash     .   Review of Systems  Constitutional: Positive for fatigue. Negative for fever and appetite change.  Respiratory: Negative for  cough and shortness of breath.   Cardiovascular: Negative for chest pain.  Gastrointestinal: Negative for abdominal pain.  Endocrine: Positive for polydipsia and polyuria.  Genitourinary: Negative for dysuria.  Neurological: Negative for syncope.  Hematological: Negative for adenopathy.       Objective:   Physical Exam  Constitutional: He appears well-developed and well-nourished.  HENT:  Mouth/Throat: Oropharynx is clear and moist.  Neck: Neck supple. No thyromegaly present.  Cardiovascular: Normal rate and regular rhythm.   Pulmonary/Chest: Effort normal and breath sounds normal. No respiratory distress. He has no wheezes. He has no rales.  Musculoskeletal: He exhibits no edema.          Assessment & Plan:  Patient seen with classic diabetes triad of weight loss, polyuria, and polydipsia. Three-hour random blood sugar today is reading over 500. Obtain screening lab work including hemoglobin A1c. Patient will be given Levemir 15 units 1 time dose in the office and return tomorrow morning for fasting blood sugar. Obtain screening labs-including A1C. Consider metformin but check renal function first .  May need some short term use of Novolog or Humalog.

## 2013-07-17 ENCOUNTER — Telehealth: Payer: Self-pay | Admitting: Family Medicine

## 2013-07-17 ENCOUNTER — Ambulatory Visit (INDEPENDENT_AMBULATORY_CARE_PROVIDER_SITE_OTHER): Payer: Managed Care, Other (non HMO) | Admitting: Family Medicine

## 2013-07-17 ENCOUNTER — Ambulatory Visit: Payer: Managed Care, Other (non HMO) | Admitting: Family Medicine

## 2013-07-17 ENCOUNTER — Encounter: Payer: Self-pay | Admitting: Family Medicine

## 2013-07-17 VITALS — BP 134/86 | HR 99 | Temp 97.6°F | Wt 242.0 lb

## 2013-07-17 DIAGNOSIS — E119 Type 2 diabetes mellitus without complications: Secondary | ICD-10-CM

## 2013-07-17 DIAGNOSIS — E1165 Type 2 diabetes mellitus with hyperglycemia: Secondary | ICD-10-CM

## 2013-07-17 LAB — HEPATIC FUNCTION PANEL
ALT: 25 U/L (ref 0–53)
AST: 27 U/L (ref 0–37)
Alkaline Phosphatase: 53 U/L (ref 39–117)
Bilirubin, Direct: 0.1 mg/dL (ref 0.0–0.3)
Total Bilirubin: 0.7 mg/dL (ref 0.3–1.2)

## 2013-07-17 LAB — BASIC METABOLIC PANEL
BUN: 22 mg/dL (ref 6–23)
CO2: 24 mEq/L (ref 19–32)
Calcium: 10.1 mg/dL (ref 8.4–10.5)
Creatinine, Ser: 1.1 mg/dL (ref 0.4–1.5)
GFR: 68.94 mL/min (ref >60.00)
Glucose, Bld: 621 mg/dL (ref 70–99)

## 2013-07-17 LAB — LDL CHOLESTEROL, DIRECT: Direct LDL: 41.3 mg/dL

## 2013-07-17 LAB — CBC WITH DIFFERENTIAL/PLATELET
Basophils Absolute: 0 10*3/uL (ref 0.0–0.1)
Basophils Relative: 0.3 % (ref 0.0–3.0)
Eosinophils Relative: 1.5 % (ref 0.0–5.0)
MCV: 96.7 fl (ref 78.0–100.0)
Monocytes Absolute: 1 10*3/uL (ref 0.1–1.0)
Monocytes Relative: 13.3 % — ABNORMAL HIGH (ref 3.0–12.0)
Neutrophils Relative %: 64.6 % (ref 43.0–77.0)
Platelets: 177 10*3/uL (ref 150.0–400.0)
RBC: 4.34 Mil/uL (ref 4.22–5.81)
WBC: 7.7 10*3/uL (ref 4.5–10.5)

## 2013-07-17 LAB — HEMOGLOBIN A1C: Hgb A1c MFr Bld: 10.5 % — ABNORMAL HIGH (ref 4.6–6.5)

## 2013-07-17 LAB — TSH: TSH: 1.21 u[IU]/mL (ref 0.35–5.50)

## 2013-07-17 LAB — LIPID PANEL: HDL: 29.2 mg/dL — ABNORMAL LOW (ref >39.00)

## 2013-07-17 MED ORDER — METFORMIN HCL 500 MG PO TABS: 500.0000 mg | ORAL_TABLET | Freq: Two times a day (BID) | ORAL | Status: DC

## 2013-07-17 NOTE — Progress Notes (Signed)
  Subjective:    Patient ID: Adam [name], male    DOB: 1951/05/29, 62 y.o.   MRN: [id number]  HPI  Patient seen for followup regarding elevated blood sugars Refer to yesterday. Blood sugar over 500. Classic symptoms of diabetes. We gave Levemir 15 units 1 dose and he returns for fasting blood sugar this morning 350. Lab work drawn yesterday is still pending. Symptoms unchanged. No nausea or vomiting.  Past Medical History  Diagnosis Date  . Hypertension   . Coronary artery disease     x 6  . COPD (chronic obstructive pulmonary disease)   . Asthma   . Arthritis   . Depression   . GERD (gastroesophageal reflux disease)   . Allergy   . Hyperlipidemia   . Family history of polyps in the colon   . Anxiety     history of PTSD following CABG  . Gout   . Prediabetes   . H/O atrial fibrillation without current medication     following CABG with no documented episodes since then.  . RLS (restless legs syndrome)    Past Surgical History  Procedure Laterality Date  . Coronary angioplasty with stent placement    . Coronary artery bypass graft    . Appendectomy    . Total hip arthroplasty      reports that he quit smoking about 35 years ago. His smoking use included Cigarettes. He has a 20 pack-year smoking history. He has never used smokeless tobacco. He reports that he drinks alcohol. He reports that he does not use illicit drugs. family history includes Cancer in his mother; Heart disease in his paternal grandfather and paternal grandmother. Allergies  Allergen Reactions  . Clindamycin/Lincomycin   . Keflex [Cephalexin]     rash     Review of Systems  Constitutional: Positive for unexpected weight change. Negative for fever, chills and appetite change.  Respiratory: Negative for cough and shortness of breath.   Cardiovascular: Negative for chest pain.  Gastrointestinal: Negative for abdominal pain.  Endocrine: Positive for polydipsia and polyuria.       Objective:   Physical Exam  Constitutional: He appears well-developed and well-nourished.  Cardiovascular: Normal rate and regular rhythm.   Pulmonary/Chest: Effort normal and breath sounds normal. No respiratory distress. He has no wheezes. He has no rales.  Musculoskeletal: He exhibits no edema.          Assessment & Plan:  Type 2 diabetes newly diagnosed and poorly controlled. Continue Levemir 15 units once daily. Home glucose monitor given. Start metformin 500 mg twice a day. Reassess one week. Dietary factors discussed

## 2013-07-17 NOTE — Patient Instructions (Addendum)
Levemir 15 units once daily. Start metformin one tablet twice daily.

## 2013-07-17 NOTE — Telephone Encounter (Signed)
Glucose reading came back at 651

## 2013-07-17 NOTE — Telephone Encounter (Signed)
We are aware and have addressed.  He was given insulin yesterday and CBG this AM 350.  We have started insulin and metformin.

## 2013-07-17 NOTE — Telephone Encounter (Signed)
Hope from lab called to inform you of pt's "critical" glucose, please advise.

## 2013-07-24 ENCOUNTER — Encounter: Payer: Self-pay | Admitting: Family Medicine

## 2013-07-24 ENCOUNTER — Ambulatory Visit (INDEPENDENT_AMBULATORY_CARE_PROVIDER_SITE_OTHER): Payer: Managed Care, Other (non HMO) | Admitting: Family Medicine

## 2013-07-24 VITALS — BP 118/78 | HR 87 | Temp 97.9°F | Wt 243.0 lb

## 2013-07-24 DIAGNOSIS — E1165 Type 2 diabetes mellitus with hyperglycemia: Secondary | ICD-10-CM

## 2013-07-24 DIAGNOSIS — E119 Type 2 diabetes mellitus without complications: Secondary | ICD-10-CM

## 2013-07-24 DIAGNOSIS — E785 Hyperlipidemia, unspecified: Secondary | ICD-10-CM

## 2013-07-24 MED ORDER — INSULIN DETEMIR 100 UNIT/ML FLEXPEN: PEN_INJECTOR | SUBCUTANEOUS | Status: DC

## 2013-07-24 MED ORDER — METFORMIN HCL 1000 MG PO TABS: 1000.0000 mg | ORAL_TABLET | Freq: Two times a day (BID) | ORAL | Status: DC

## 2013-07-24 NOTE — Patient Instructions (Addendum)
Increase metformin to 1,000 mg twice daily. Increase Levemir to 40 units once daily Check fasting glucose DAILY Titrate Levemir up 2 units every 2 days until fasting glucose is consistently < 130.  Add back omega 3 (fish oil) supplement  Hypertriglyceridemia  Diet for High blood levels of Triglycerides Most fats in food are triglycerides. Triglycerides in your blood are stored as fat in your body. High levels of triglycerides in your blood may put you at a greater risk for heart disease and stroke.  Normal triglyceride levels are less than 150 mg/dL. Borderline high levels are 150-199 mg/dl. High levels are 200 - 499 mg/dL, and very high triglyceride levels are greater than 500 mg/dL. The decision to treat high triglycerides is generally based on the level. For people with borderline or high triglyceride levels, treatment includes weight loss and exercise. Drugs are recommended for people with very high triglyceride levels. Many people who need treatment for high triglyceride levels have metabolic syndrome. This syndrome is a collection of disorders that often include: insulin resistance, high blood pressure, blood clotting problems, high cholesterol and triglycerides. TESTING PROCEDURE FOR TRIGLYCERIDES  You should not eat 4 hours before getting your triglycerides measured. The normal range of triglycerides is between 10 and 250 milligrams per deciliter (mg/dl). Some people may have extreme levels (1000 or above), but your triglyceride level may be too high if it is above 150 mg/dl, depending on what other risk factors you have for heart disease.  People with high blood triglycerides may also have high blood cholesterol levels. If you have high blood cholesterol as well as high blood triglycerides, your risk for heart disease is probably greater than if you only had high triglycerides. High blood cholesterol is one of the main risk factors for heart disease. CHANGING YOUR DIET  Your weight can  affect your blood triglyceride level. If you are more than 20% above your ideal body weight, you may be able to lower your blood triglycerides by losing weight. Eating less and exercising regularly is the best way to combat this. Fat provides more calories than any other food. The best way to lose weight is to eat less fat. Only 30% of your total calories should come from fat. Less than 7% of your diet should come from saturated fat. A diet low in fat and saturated fat is the same as a diet to decrease blood cholesterol. By eating a diet lower in fat, you may lose weight, lower your blood cholesterol, and lower your blood triglyceride level.  Eating a diet low in fat, especially saturated fat, may also help you lower your blood triglyceride level. Ask your dietitian to help you figure how much fat you can eat based on the number of calories your caregiver has prescribed for you.  Exercise, in addition to helping with weight loss may also help lower triglyceride levels.   Alcohol can increase blood triglycerides. You may need to stop drinking alcoholic beverages.  Too much carbohydrate in your diet may also increase your blood triglycerides. Some complex carbohydrates are necessary in your diet. These may include bread, rice, potatoes, other starchy vegetables and cereals.  Reduce "simple" carbohydrates. These may include pure sugars, candy, honey, and jelly without losing other nutrients. If you have the kind of high blood triglycerides that is affected by the amount of carbohydrates in your diet, you will need to eat less sugar and less high-sugar foods. Your caregiver can help you with this.  Adding 2-4 grams of fish  oil (EPA+ DHA) may also help lower triglycerides. Speak with your caregiver before adding any supplements to your regimen. Following the Diet  Maintain your ideal weight. Your caregivers can help you with a diet. Generally, eating less food and getting more exercise will help you lose  weight. Joining a weight control group may also help. Ask your caregivers for a good weight control group in your area.  Eat low-fat foods instead of high-fat foods. This can help you lose weight too.  These foods are lower in fat. Eat MORE of these:   Dried beans, peas, and lentils.  Egg whites.  Low-fat cottage cheese.  Fish.  Lean cuts of meat, such as round, sirloin, rump, and flank (cut extra fat off meat you fix).  Whole grain breads, cereals and pasta.  Skim and nonfat dry milk.  Low-fat yogurt.  Poultry without the skin.  Cheese made with skim or part-skim milk, such as mozzarella, parmesan, farmers', ricotta, or pot cheese. These are higher fat foods. Eat LESS of these:   Whole milk and foods made from whole milk, such as American, blue, cheddar, monterey jack, and swiss cheese  High-fat meats, such as luncheon meats, sausages, knockwurst, bratwurst, hot dogs, ribs, corned beef, ground pork, and regular ground beef.  Fried foods. Limit saturated fats in your diet. Substituting unsaturated fat for saturated fat may decrease your blood triglyceride level. You will need to read package labels to know which products contain saturated fats.  These foods are high in saturated fat. Eat LESS of these:   Fried pork skins.  Whole milk.  Skin and fat from poultry.  Palm oil.  Butter.  Shortening.  Cream cheese.  Tomasa Blase.  Margarines and baked goods made from listed oils.  Vegetable shortenings.  Chitterlings.  Fat from meats.  Coconut oil.  Palm kernel oil.  Lard.  Cream.  Sour cream.  Fatback.  Coffee whiteners and non-dairy creamers made with these oils.  Cheese made from whole milk. Use unsaturated fats (both polyunsaturated and monounsaturated) moderately. Remember, even though unsaturated fats are better than saturated fats; you still want a diet low in total fat.  These foods are high in unsaturated fat:   Canola oil.  Sunflower  oil.  Mayonnaise.  Almonds.  Peanuts.  Pine nuts.  Margarines made with these oils.  Safflower oil.  Olive oil.  Avocados.  Cashews.  Peanut butter.  Sunflower seeds.  Soybean oil.  Peanut oil.  Olives.  Pecans.  Walnuts.  Pumpkin seeds. Avoid sugar and other high-sugar foods. This will decrease carbohydrates without decreasing other nutrients. Sugar in your food goes rapidly to your blood. When there is excess sugar in your blood, your liver may use it to make more triglycerides. Sugar also contains calories without other important nutrients.  Eat LESS of these:   Sugar, brown sugar, powdered sugar, jam, jelly, preserves, honey, syrup, molasses, pies, candy, cakes, cookies, frosting, pastries, colas, soft drinks, punches, fruit drinks, and regular gelatin.  Avoid alcohol. Alcohol, even more than sugar, may increase blood triglycerides. In addition, alcohol is high in calories and low in nutrients. Ask for sparkling water, or a diet soft drink instead of an alcoholic beverage. Suggestions for planning and preparing meals   Bake, broil, grill or roast meats instead of frying.  Remove fat from meats and skin from poultry before cooking.  Add spices, herbs, lemon juice or vinegar to vegetables instead of salt, rich sauces or gravies.  Use a non-stick skillet without fat  or use no-stick sprays.  Cool and refrigerate stews and broth. Then remove the hardened fat floating on the surface before serving.  Refrigerate meat drippings and skim off fat to make low-fat gravies.  Serve more fish.  Use less butter, margarine and other high-fat spreads on bread or vegetables.  Use skim or reconstituted non-fat dry milk for cooking.  Cook with low-fat cheeses.  Substitute low-fat yogurt or cottage cheese for all or part of the sour cream in recipes for sauces, dips or congealed salads.  Use half yogurt/half mayonnaise in salad recipes.  Substitute evaporated skim  milk for cream. Evaporated skim milk or reconstituted non-fat dry milk can be whipped and substituted for whipped cream in certain recipes.  Choose fresh fruits for dessert instead of high-fat foods such as pies or cakes. Fruits are naturally low in fat. When Dining Out   Order low-fat appetizers such as fruit or vegetable juice, pasta with vegetables or tomato sauce.  Select clear, rather than cream soups.  Ask that dressings and gravies be served on the side. Then use less of them.  Order foods that are baked, broiled, poached, steamed, stir-fried, or roasted.  Ask for margarine instead of butter, and use only a small amount.  Drink sparkling water, unsweetened tea or coffee, or diet soft drinks instead of alcohol or other sweet beverages. QUESTIONS AND ANSWERS ABOUT OTHER FATS IN THE BLOOD: SATURATED FAT, TRANS FAT, AND CHOLESTEROL What is trans fat? Trans fat is a type of fat that is formed when vegetable oil is hardened through a process called hydrogenation. This process helps makes foods more solid, gives them shape, and prolongs their shelf life. Trans fats are also called hydrogenated or partially hydrogenated oils.  What do saturated fat, trans fat, and cholesterol in foods have to do with heart disease? Saturated fat, trans fat, and cholesterol in the diet all raise the level of LDL "bad" cholesterol in the blood. The higher the LDL cholesterol, the greater the risk for coronary heart disease (CHD). Saturated fat and trans fat raise LDL similarly.  What foods contain saturated fat, trans fat, and cholesterol? High amounts of saturated fat are found in animal products, such as fatty cuts of meat, chicken skin, and full-fat dairy products like butter, whole milk, cream, and cheese, and in tropical vegetable oils such as palm, palm kernel, and coconut oil. Trans fat is found in some of the same foods as saturated fat, such as vegetable shortening, some margarines (especially hard or  stick margarine), crackers, cookies, baked goods, fried foods, salad dressings, and other processed foods made with partially hydrogenated vegetable oils. Small amounts of trans fat also occur naturally in some animal products, such as milk products, beef, and lamb. Foods high in cholesterol include liver, other organ meats, egg yolks, shrimp, and full-fat dairy products. How can I use the new food label to make heart-healthy food choices? Check the Nutrition Facts panel of the food label. Choose foods lower in saturated fat, trans fat, and cholesterol. For saturated fat and cholesterol, you can also use the Percent Daily Value (%DV): 5% DV or less is low, and 20% DV or more is high. (There is no %DV for trans fat.) Use the Nutrition Facts panel to choose foods low in saturated fat and cholesterol, and if the trans fat is not listed, read the ingredients and limit products that list shortening or hydrogenated or partially hydrogenated vegetable oil, which tend to be high in trans fat. POINTS TO REMEMBER:  Discuss your risk for heart disease with your caregivers, and take steps to reduce risk factors.  Change your diet. Choose foods that are low in saturated fat, trans fat, and cholesterol.  Add exercise to your daily routine if it is not already being done. Participate in physical activity of moderate intensity, like brisk walking, for at least 30 minutes on most, and preferably all days of the week. No time? Break the 30 minutes into three, 10-minute segments during the day.  Stop smoking. If you do smoke, contact your caregiver to discuss ways in which they can help you quit.  Do not use street drugs.  Maintain a normal weight.  Maintain a healthy blood pressure.  Keep up with your blood work for checking the fats in your blood as directed by your caregiver. Document Released: 07/08/2004 Document Revised: 03/21/2012 Document Reviewed: 02/03/2009 [facility] Patient Information 2014 [place],  [place].

## 2013-07-24 NOTE — Progress Notes (Signed)
Subjective:    Patient ID: Adam [name], male    DOB: Jul 30, 1951, 62 y.o.   MRN: [id number]  HPI Followup regarding recently diagnosed type 2 diabetes. He had extremely elevated blood sugars as well as extremely elevated triglycerides over 1700. We initiated insulin with Levemir 15 units which he has been taking twice daily. He has not monitored blood sugars over the past week. We also initiated metformin 500 mg twice a day. He has not had any adverse side effects. No hypoglycemic symptoms. Blood sugar 327 fasting this morning. He has seen some improvement in terms of decreased urine frequency and somewhat less thirst and more energy. Denies any nausea or vomiting. No abdominal pain. No recent fever.  Patient has remote history of pancreatitis several years ago. He has not had any recent suggestive symptoms. He is still consuming daily alcohol but has scaled back somewhat.  Past Medical History  Diagnosis Date  . Hypertension   . Coronary artery disease     x 6  . COPD (chronic obstructive pulmonary disease)   . Asthma   . Arthritis   . Depression   . GERD (gastroesophageal reflux disease)   . Allergy   . Hyperlipidemia   . Family history of polyps in the colon   . Anxiety     history of PTSD following CABG  . Gout   . Prediabetes   . H/O atrial fibrillation without current medication     following CABG with no documented episodes since then.  . RLS (restless legs syndrome)    Past Surgical History  Procedure Laterality Date  . Coronary angioplasty with stent placement    . Coronary artery bypass graft    . Appendectomy    . Total hip arthroplasty      reports that he quit smoking about 35 years ago. His smoking use included Cigarettes. He has a 20 pack-year smoking history. He has never used smokeless tobacco. He reports that he drinks alcohol. He reports that he does not use illicit drugs. family history includes Cancer in his mother; Heart disease in his paternal  grandfather and paternal grandmother. Allergies  Allergen Reactions  . Clindamycin/Lincomycin   . Keflex [Cephalexin]     rash      Review of Systems  Constitutional: Negative for fever, chills and appetite change.  Respiratory: Negative for shortness of breath.   Cardiovascular: Negative for chest pain.  Gastrointestinal: Negative for nausea, vomiting and abdominal pain.  Neurological: Negative for weakness.       Objective:   Physical Exam  Constitutional: He is oriented to person, place, and time. He appears well-developed and well-nourished.  Neck: Neck supple. No thyromegaly present.  Cardiovascular: Normal rate and regular rhythm.   Pulmonary/Chest: Effort normal and breath sounds normal. No respiratory distress. He has no wheezes. He has no rales.  Musculoskeletal: He exhibits no edema.  Neurological: He is alert and oriented to person, place, and time.  Skin: No rash noted.          Assessment & Plan:  Type 2 diabetes newly diagnosed and very poorly controlled. He also has severe dyslipidemia with triglycerides over 1700. We explained to some extent high triglycerides related to poorly controlled diabetes. Over 25 minutes spent in counseling.  *Set up a dietitian referral *Titrate metformin 1000 mg twice a day *Increased Levemir to 40 units once daily with titration regimen given *Check fasting blood sugars daily *Reduce alcohol consumption. *Handout given on hypertriglyceridemia and add back omega-3 3  g daily and continue TriCor *We'll plan repeat labs in one month-lipids

## 2013-07-27 ENCOUNTER — Encounter: Payer: Self-pay | Admitting: Family Medicine

## 2013-07-27 ENCOUNTER — Other Ambulatory Visit: Payer: Self-pay

## 2013-07-27 MED ORDER — GLUCOSE BLOOD VI STRP: ORAL_STRIP | Status: DC

## 2013-07-27 MED ORDER — ONETOUCH DELICA LANCETS FINE MISC: 1.0000 | Status: DC | PRN

## 2013-08-03 ENCOUNTER — Encounter: Payer: Self-pay | Admitting: Family Medicine

## 2013-08-07 ENCOUNTER — Other Ambulatory Visit: Payer: Self-pay

## 2013-08-07 DIAGNOSIS — R0602 Shortness of breath: Secondary | ICD-10-CM

## 2013-08-07 DIAGNOSIS — R079 Chest pain, unspecified: Secondary | ICD-10-CM

## 2013-08-07 MED ORDER — LORAZEPAM 0.5 MG PO TABS: ORAL_TABLET | ORAL | Status: DC

## 2013-08-07 NOTE — Telephone Encounter (Signed)
Advise patient that there is technically no upper limit to insulin, but if he is getting up above about 100 units daily we should look at adding some mealtime insulin. We can refill his lorazepam.  Generally, do not advise taking this more frequent than one every 8 hours.  I would have him try to scale this back.   Refill OK for 3 months.

## 2013-08-14 ENCOUNTER — Encounter: Payer: Self-pay | Admitting: Family Medicine

## 2013-08-14 ENCOUNTER — Other Ambulatory Visit (INDEPENDENT_AMBULATORY_CARE_PROVIDER_SITE_OTHER): Payer: Managed Care, Other (non HMO)

## 2013-08-14 DIAGNOSIS — Z0000 Encounter for general adult medical examination without abnormal findings: Secondary | ICD-10-CM

## 2013-08-14 LAB — LIPID PANEL
HDL: 31.5 mg/dL — ABNORMAL LOW (ref >39.00)
Total CHOL/HDL Ratio: 4
Triglycerides: 180 mg/dL — ABNORMAL HIGH (ref 0.0–149.0)
VLDL: 36 mg/dL (ref 0.0–40.0)

## 2013-08-14 LAB — POCT URINALYSIS DIPSTICK
Blood, UA: NEGATIVE
Ketones, UA: NEGATIVE
Leukocytes, UA: NEGATIVE
Protein, UA: NEGATIVE
Urobilinogen, UA: 0.2
pH, UA: 6

## 2013-08-14 LAB — BASIC METABOLIC PANEL
Calcium: 9.5 mg/dL (ref 8.4–10.5)
GFR: 98.05 mL/min (ref >60.00)
Sodium: 137 mEq/L (ref 135–145)

## 2013-08-14 LAB — HEPATIC FUNCTION PANEL
ALT: 44 U/L (ref 0–53)
AST: 41 U/L — ABNORMAL HIGH (ref 0–37)
Albumin: 4.1 g/dL (ref 3.5–5.2)
Alkaline Phosphatase: 32 U/L — ABNORMAL LOW (ref 39–117)
Total Bilirubin: 0.9 mg/dL (ref 0.3–1.2)

## 2013-08-14 LAB — CBC WITH DIFFERENTIAL/PLATELET
Basophils Absolute: 0 10*3/uL (ref 0.0–0.1)
Basophils Relative: 0.4 % (ref 0.0–3.0)
Eosinophils Relative: 2.8 % (ref 0.0–5.0)
Hemoglobin: 13.2 g/dL (ref 13.0–17.0)
Lymphocytes Relative: 24.1 % (ref 12.0–46.0)
Lymphs Abs: 1.2 10*3/uL (ref 0.7–4.0)
Monocytes Relative: 15.7 % — ABNORMAL HIGH (ref 3.0–12.0)
Neutro Abs: 2.8 10*3/uL (ref 1.4–7.7)
Platelets: 154 10*3/uL (ref 150.0–400.0)
RBC: 3.99 Mil/uL — ABNORMAL LOW (ref 4.22–5.81)
RDW: 13.2 % (ref 11.5–14.6)
WBC: 4.9 10*3/uL (ref 4.5–10.5)

## 2013-08-14 LAB — HEMOGLOBIN A1C: Hgb A1c MFr Bld: 9.4 % — ABNORMAL HIGH (ref 4.6–6.5)

## 2013-08-16 ENCOUNTER — Encounter: Payer: Managed Care, Other (non HMO) | Attending: Family Medicine

## 2013-08-16 VITALS — Ht 70.0 in | Wt 248.0 lb

## 2013-08-16 DIAGNOSIS — E119 Type 2 diabetes mellitus without complications: Secondary | ICD-10-CM | POA: Insufficient documentation

## 2013-08-16 DIAGNOSIS — E1165 Type 2 diabetes mellitus with hyperglycemia: Secondary | ICD-10-CM

## 2013-08-16 DIAGNOSIS — Z713 Dietary counseling and surveillance: Secondary | ICD-10-CM | POA: Insufficient documentation

## 2013-08-16 NOTE — Progress Notes (Signed)
Patient was seen on 08/16/13 for the first of a series of three diabetes self-management courses at the [facility].  Current HbA1c: 9.4%  The following learning objectives were met by the patient during this class:  Describe diabetes  State some common risk factors for diabetes  Defines the role of glucose and insulin  Identifies type of diabetes and pathophysiology  Describe the relationship between diabetes and cardiovascular risk  State the members of the Healthcare Team  States the rationale for glucose monitoring  State when to test glucose  State their individual Target Range  State the importance of logging glucose readings  Describe how to interpret glucose readings  Identifies A1C target  Explain the correlation between A1c and eAG values  State symptoms and treatment of high blood glucose  State symptoms and treatment of low blood glucose  Explain proper technique for glucose testing  Identifies proper sharps disposal  Handouts given during class include:  Living Well with Diabetes book  Carb Counting and Meal Planning book  Meal Plan Card  Carbohydrate guide  Meal planning worksheet  Low Sodium Flavoring Tips  The diabetes portion plate  Low Carbohydrate Snack Suggestions  A1c to eAG Conversion Chart  Diabetes Medications  Stress Management  Diabetes Recommended Care Schedule  Diabetes Success Plan  Core Class Satisfaction Survey  Your patient has identified their diabetes care support plan as:  [facility]  Staff  Family, encouraged wife to come to  Next session  Follow-Up Plan:  Attend core 2

## 2013-08-18 ENCOUNTER — Other Ambulatory Visit: Payer: Self-pay

## 2013-08-20 ENCOUNTER — Encounter: Payer: Self-pay | Admitting: Family Medicine

## 2013-08-21 ENCOUNTER — Encounter: Payer: Self-pay | Admitting: Family Medicine

## 2013-08-21 ENCOUNTER — Ambulatory Visit (INDEPENDENT_AMBULATORY_CARE_PROVIDER_SITE_OTHER): Payer: Managed Care, Other (non HMO) | Admitting: Family Medicine

## 2013-08-21 VITALS — BP 120/82 | HR 73 | Temp 97.5°F | Wt 250.0 lb

## 2013-08-21 DIAGNOSIS — E119 Type 2 diabetes mellitus without complications: Secondary | ICD-10-CM

## 2013-08-21 DIAGNOSIS — E1165 Type 2 diabetes mellitus with hyperglycemia: Secondary | ICD-10-CM

## 2013-08-21 DIAGNOSIS — Z0000 Encounter for general adult medical examination without abnormal findings: Secondary | ICD-10-CM

## 2013-08-21 DIAGNOSIS — Z23 Encounter for immunization: Secondary | ICD-10-CM

## 2013-08-21 LAB — HM DIABETES EYE EXAM

## 2013-08-21 NOTE — Patient Instructions (Signed)
Blood sugars goals are < 130 fasting or < 180 2 hours postprandial (after meals) Let me know if fasting sugars not in ballpark of 130 after titration up to 80 units of insulin,

## 2013-08-21 NOTE — Progress Notes (Signed)
Subjective:    Patient ID: Adam [name], male    DOB: Jul 01, 1951, 62 y.o.   MRN: [id number]  HPI Patient seen for complete physical. He has history of multiple chronic problems including history of CAD, COPD, hypertension, dyslipidemia, hypertension, GERD, gout, history of atrial fibrillation, osteoarthritis, restless leg syndrome and recently diagnosed type 2 diabetes. He presented recently with classic symptoms of polyuria, polydipsia, and weight loss. His blood sugars were over 600. We started metformin and insulin and blood sugars dramatically improved. Fasting blood sugars still run 150 but frequently later date blood sugars in the low 100 range. Not checking any postprandials.  He is currently on Levemir 72 units each morning. Metformin 1000 mg twice daily. Overall feels much better. No symptoms of hyperglycemia. No hypoglycemic symptoms. He started nutritional counseling and has seen diabetes educator.  His triglycerides were recently over 1700 and these have improved dramatically 180 with reduction in alcohol and blood sugar control. His A1c is already improved from 10.5 to 9.4 after just one month.  He's had flu vaccine. Needs shingles vaccine. Last colonoscopy reportedly last year. His mother had colon cancer.  His alcohol consumption had been over 1 bottle of wine per day he currently drinks about 4-5 ounces of scotch per day and no wine.  Past Medical History  Diagnosis Date  . Hypertension   . Coronary artery disease     x 6  . COPD (chronic obstructive pulmonary disease)   . Asthma   . Arthritis   . Depression   . GERD (gastroesophageal reflux disease)   . Allergy   . Hyperlipidemia   . Family history of polyps in the colon   . Anxiety     history of PTSD following CABG  . Gout   . Prediabetes   . H/O atrial fibrillation without current medication     following CABG with no documented episodes since then.  . RLS (restless legs syndrome)    Past Surgical History   Procedure Laterality Date  . Coronary angioplasty with stent placement    . Coronary artery bypass graft    . Appendectomy    . Total hip arthroplasty      reports that he quit smoking about 35 years ago. His smoking use included Cigarettes. He has a 20 pack-year smoking history. He has never used smokeless tobacco. He reports that he drinks alcohol. He reports that he does not use illicit drugs. family history includes Cancer in his mother; Heart disease in his paternal grandfather and paternal grandmother. Allergies  Allergen Reactions  . Clindamycin/Lincomycin   . Keflex [Cephalexin]     rash      Review of Systems  Constitutional: Negative for fever, activity change, appetite change, fatigue and unexpected weight change.  HENT: Negative for congestion, ear pain and trouble swallowing.   Eyes: Negative for pain and visual disturbance.  Respiratory: Negative for cough, shortness of breath and wheezing.   Cardiovascular: Negative for chest pain and palpitations.  Gastrointestinal: Negative for nausea, vomiting, abdominal pain, diarrhea, constipation, blood in stool, abdominal distention and rectal pain.  Endocrine: Negative for polydipsia and polyuria.  Genitourinary: Negative for dysuria, hematuria and testicular pain.  Musculoskeletal: Negative for arthralgias and joint swelling.  Skin: Negative for rash.  Neurological: Negative for dizziness, syncope and headaches.  Hematological: Negative for adenopathy.  Psychiatric/Behavioral: Negative for confusion and dysphoric mood.       Objective:   Physical Exam  Constitutional: He is oriented to person, place, and time.  He appears well-developed and well-nourished. No distress.  HENT:  Head: Normocephalic and atraumatic.  Right Ear: External ear normal.  Left Ear: External ear normal.  Mouth/Throat: Oropharynx is clear and moist.  Eyes: Conjunctivae and EOM are normal. Pupils are equal, round, and reactive to light.  Neck:  Normal range of motion. Neck supple. No thyromegaly present.  Cardiovascular: Normal rate, regular rhythm and normal heart sounds.   No murmur heard. Pulmonary/Chest: No respiratory distress. He has no wheezes. He has no rales.  Abdominal: Soft. Bowel sounds are normal. He exhibits no distension and no mass. There is no tenderness. There is no rebound and no guarding.  Musculoskeletal: He exhibits no edema.  Lymphadenopathy:    He has no cervical adenopathy.  Neurological: He is alert and oriented to person, place, and time. He displays normal reflexes. No cranial nerve deficit.  Skin: No rash noted.  Patient has several scaly keratotic lesions scattered on his extremities and trunk. Right leg reveals a thickened hyperkeratotic lesion with slightly nodular base-5 mm diameter.  Psychiatric: He has a normal mood and affect.          Assessment & Plan:  Complete physical. Colonoscopy reportedly up-to-date. Shingles vaccine given. Flu vaccine already given.  He's had previous Pneumovax. Labs reviewed he has seen dramatic improvement with blood sugar control. We'll plan repeat A1c in 3 months. Bring back for excision right leg lesion. Suspect probable seborrheic keratosis but rule out squamous cell. Continue to scale back ETOH use.

## 2013-08-21 NOTE — Progress Notes (Signed)
Pre visit review using our clinic review tool, if applicable. No additional management support is needed unless otherwise documented below in the visit note. 

## 2013-08-23 DIAGNOSIS — E1165 Type 2 diabetes mellitus with hyperglycemia: Secondary | ICD-10-CM

## 2013-08-23 NOTE — Progress Notes (Signed)
Patient was seen on 08/23/13 for the second of a series of three diabetes self-management courses at the [facility]. The following learning objectives were met by the patient during this class:   Describe the role of different macronutrients on glucose  Explain how carbohydrates affect blood glucose  State what foods contain the most carbohydrates  Demonstrate carbohydrate counting  Demonstrate how to read Nutrition Facts food label  Describe effects of various fats on heart health  Describe the importance of good nutrition for health and healthy eating strategies  Describe techniques for managing your shopping, cooking and meal planning  List strategies to follow meal plan when dining out  Describe the effects of alcohol on glucose and how to use it safely  Follow-Up Plan:  Attend Core 3  Work towards following your personal food plan.   

## 2013-08-24 ENCOUNTER — Ambulatory Visit: Payer: Managed Care, Other (non HMO) | Admitting: Family Medicine

## 2013-08-27 ENCOUNTER — Encounter: Payer: Self-pay | Admitting: Family Medicine

## 2013-09-03 ENCOUNTER — Encounter: Payer: Self-pay | Admitting: Family Medicine

## 2013-09-04 ENCOUNTER — Other Ambulatory Visit: Payer: Self-pay

## 2013-09-04 ENCOUNTER — Encounter: Payer: Self-pay | Admitting: Family Medicine

## 2013-09-04 MED ORDER — INSULIN PEN NEEDLE 32G X 4 MM MISC: Status: DC

## 2013-09-05 ENCOUNTER — Encounter: Payer: Self-pay | Admitting: Family Medicine

## 2013-09-06 ENCOUNTER — Other Ambulatory Visit: Payer: Self-pay

## 2013-09-06 MED ORDER — INSULIN DETEMIR 100 UNIT/ML FLEXPEN: PEN_INJECTOR | SUBCUTANEOUS | Status: DC

## 2013-09-11 ENCOUNTER — Other Ambulatory Visit: Payer: Self-pay

## 2013-09-11 ENCOUNTER — Encounter: Payer: Self-pay | Admitting: Family Medicine

## 2013-09-11 MED ORDER — FLUTICASONE-SALMETEROL 250-50 MCG/DOSE IN AEPB: INHALATION_SPRAY | RESPIRATORY_TRACT | Status: DC

## 2013-09-11 MED ORDER — EZETIMIBE-SIMVASTATIN 10-80 MG PO TABS: 1.0000 | ORAL_TABLET | Freq: Every day | ORAL | Status: DC

## 2013-09-17 ENCOUNTER — Other Ambulatory Visit: Payer: Self-pay

## 2013-09-20 ENCOUNTER — Ambulatory Visit: Payer: Managed Care, Other (non HMO)

## 2013-09-20 ENCOUNTER — Encounter: Payer: Self-pay | Admitting: Family Medicine

## 2013-09-21 ENCOUNTER — Other Ambulatory Visit: Payer: Self-pay

## 2013-09-21 MED ORDER — FLUTICASONE-SALMETEROL 250-50 MCG/DOSE IN AEPB: INHALATION_SPRAY | RESPIRATORY_TRACT | Status: DC

## 2013-09-26 ENCOUNTER — Encounter: Payer: Self-pay | Admitting: Family Medicine

## 2013-09-26 ENCOUNTER — Other Ambulatory Visit: Payer: Self-pay

## 2013-09-26 MED ORDER — PRAMIPEXOLE DIHYDROCHLORIDE 0.125 MG PO TABS: 0.1250 mg | ORAL_TABLET | Freq: Every evening | ORAL | Status: DC | PRN

## 2013-09-28 ENCOUNTER — Telehealth: Payer: Self-pay | Admitting: Family Medicine

## 2013-09-28 MED ORDER — FLUTICASONE-SALMETEROL 250-50 MCG/DOSE IN AEPB: INHALATION_SPRAY | RESPIRATORY_TRACT | Status: DC

## 2013-09-28 NOTE — Telephone Encounter (Signed)
Called and changed refill for patient

## 2013-09-28 NOTE — Telephone Encounter (Signed)
[facility] needs to confirm the quantity that should be dispensed for Fluticasone-Salmeterol (ADVAIR DISKUS) 250-50 MCG/DOSE AEPB [[id number]], 90 day supply?  Pharmacy wasn't sure if you were aware that "90 each" translates to 90 individual pumps.  Please call [phone] and use reference # [phone].

## 2013-10-01 ENCOUNTER — Encounter: Payer: Self-pay | Admitting: Family Medicine

## 2013-10-01 ENCOUNTER — Telehealth: Payer: Self-pay

## 2013-10-01 NOTE — Telephone Encounter (Signed)
Pt stated that he is not taking no pain meds and that no provider is prescribing in any other meds, just the ones that he currently taking.

## 2013-10-08 ENCOUNTER — Encounter: Payer: Self-pay | Admitting: Family Medicine

## 2013-10-09 ENCOUNTER — Encounter: Payer: Self-pay | Admitting: Pulmonary Disease

## 2013-10-09 ENCOUNTER — Ambulatory Visit (INDEPENDENT_AMBULATORY_CARE_PROVIDER_SITE_OTHER): Payer: BC Managed Care – PPO | Admitting: Pulmonary Disease

## 2013-10-09 VITALS — BP 128/86 | HR 77 | Temp 97.7°F | Ht 70.0 in | Wt 251.0 lb

## 2013-10-09 DIAGNOSIS — J449 Chronic obstructive pulmonary disease, unspecified: Secondary | ICD-10-CM

## 2013-10-09 NOTE — Progress Notes (Signed)
   Subjective:    Patient ID: Adam [name], male    DOB: [birth year], 63 y.o.   MRN: [id number]  HPI The patient comes in today for followup of his known COPD. His. It is very mild in nature, and he has done well on Advair alone.  He has not had an acute exacerbation since the last visit, but 8 days ago he did begin to have issues with increasing shortness of breath, weakness, and headaches. He did not have any chest tightness or wheezing, increased cough or mucus, nor did he have anginal chest discomfort. This has spontaneously resolved, and he states today he is totally back to his baseline.   Review of Systems  Constitutional: Positive for activity change and fatigue. Negative for fever and unexpected weight change.  HENT: Negative for congestion, dental problem, ear pain, nosebleeds, postnasal drip, rhinorrhea, sinus pressure, sneezing, sore throat and trouble swallowing.   Eyes: Negative for redness and itching.  Respiratory: Positive for shortness of breath. Negative for cough, chest tightness and wheezing.   Cardiovascular: Negative for palpitations and leg swelling.  Gastrointestinal: Negative for nausea and vomiting.  Genitourinary: Negative for dysuria.  Musculoskeletal: Negative for joint swelling.  Skin: Negative for rash.  Neurological: Positive for headaches.  Hematological: Does not bruise/bleed easily.  Psychiatric/Behavioral: Negative for dysphoric mood. The patient is not nervous/anxious.        Objective:   Physical Exam Overweight male in no acute distress Nose without purulence or discharge noted Neck without lymphadenopathy or thyromegaly Chest totally clear to auscultation, no wheezing Cardiac exam with regular rate and rhythm Lower extremities without edema, no cyanosis Alert and oriented, moves all 4 extremities       Assessment & Plan:

## 2013-10-09 NOTE — Patient Instructions (Signed)
No change in current breathing medications. If you have another abnormal episode, please call us and we can re-evaluate for pulmonary issues. followup with me in one year if doing well.

## 2013-10-09 NOTE — Assessment & Plan Note (Signed)
The pt has done very well on advair alone, but did have a recent episode for 8 days of increased sob that is difficult to characterize.  It spontaneously resolved today, and now he feels he is totally back to his usual baseline.  He has no congestion, chest pain, wheezing, or increased LE edema.  Will continue on usual meds, and he is to call us if occurs again

## 2013-10-11 ENCOUNTER — Encounter: Payer: Self-pay | Admitting: Family Medicine

## 2013-10-11 ENCOUNTER — Other Ambulatory Visit: Payer: Self-pay

## 2013-10-11 ENCOUNTER — Telehealth: Payer: Self-pay

## 2013-10-11 DIAGNOSIS — R079 Chest pain, unspecified: Secondary | ICD-10-CM

## 2013-10-11 DIAGNOSIS — R0602 Shortness of breath: Secondary | ICD-10-CM

## 2013-10-11 MED ORDER — OLOPATADINE HCL 0.2 % OP SOLN: 1.0000 [drp] | OPHTHALMIC | Status: DC | PRN

## 2013-10-11 NOTE — Telephone Encounter (Signed)
Ativan  Last visit 08/21/13 Last refill 08/07/13 #150 1  [facility] [address]

## 2013-10-12 MED ORDER — LORAZEPAM 0.5 MG PO TABS
ORAL_TABLET | ORAL | Status: DC
Start: 2013-10-12 — End: 2013-12-10

## 2013-10-12 NOTE — Telephone Encounter (Signed)
Rx called into the pharmacy. 

## 2013-10-12 NOTE — Telephone Encounter (Signed)
Refill with one additional refill.  I would like for him to gradually try to taper back frequency of taking this medication, if possible.

## 2013-10-22 ENCOUNTER — Other Ambulatory Visit: Payer: Self-pay | Admitting: Family Medicine

## 2013-10-22 ENCOUNTER — Encounter: Payer: Self-pay | Admitting: Pulmonary Disease

## 2013-11-21 ENCOUNTER — Ambulatory Visit (INDEPENDENT_AMBULATORY_CARE_PROVIDER_SITE_OTHER): Payer: BC Managed Care – PPO | Admitting: Family Medicine

## 2013-11-21 ENCOUNTER — Encounter: Payer: Self-pay | Admitting: Family Medicine

## 2013-11-21 VITALS — BP 138/80 | HR 79 | Temp 97.5°F | Wt 258.0 lb

## 2013-11-21 DIAGNOSIS — E1165 Type 2 diabetes mellitus with hyperglycemia: Secondary | ICD-10-CM

## 2013-11-21 DIAGNOSIS — I10 Essential (primary) hypertension: Secondary | ICD-10-CM

## 2013-11-21 DIAGNOSIS — E119 Type 2 diabetes mellitus without complications: Secondary | ICD-10-CM

## 2013-11-21 LAB — HEMOGLOBIN A1C: Hgb A1c MFr Bld: 6.4 % (ref 4.6–6.5)

## 2013-11-21 MED ORDER — CANAGLIFLOZIN 300 MG PO TABS: 1.0000 | ORAL_TABLET | Freq: Every day | ORAL | Status: DC

## 2013-11-21 NOTE — Progress Notes (Signed)
Pre visit review using our clinic review tool, if applicable. No additional management support is needed unless otherwise documented below in the visit note. 

## 2013-11-21 NOTE — Progress Notes (Signed)
Subjective:    Patient ID: Adam [name], male    DOB: 03-18-1951, 63 y.o.   MRN: [id number]  HPI Patient seen for medical followup. He has history of type 2 diabetes, CAD, hypertension, dyslipidemia, COPD, GERD, gout, past history of atrial fibrillation.  Blood sugars are gradually improving. His fasting blood sugars have been mostly around 160-180 though his late day sugars tend to be around 120-130. Last A1c 9.4%. Mental metformin and Levemir 80 units once daily. No hypoglycemia. No symptoms of hyperglycemia. He has normal renal function.  Blood pressure has been well controlled. No orthostasis. Medications reviewed and compliant with all. His lipids were recently checked and significantly improved. His triglycerides went from 1700 to almost normal with blood sugar control and fenofibrate  He had positive codeine on recent urine drug screen. Explains he had some dental work done taken some codeine following that. He does not take codeine or regular basis   he is exercising 5 days per week. Has been frustrated with his inability to lose weight    Past Medical History  Diagnosis Date  . Hypertension   . Coronary artery disease     x 6  . COPD (chronic obstructive pulmonary disease)   . Asthma   . Arthritis   . Depression   . GERD (gastroesophageal reflux disease)   . Allergy   . Hyperlipidemia   . Family history of polyps in the colon   . Anxiety     history of PTSD following CABG  . Gout   . Prediabetes   . H/O atrial fibrillation without current medication     following CABG with no documented episodes since then.  . RLS (restless legs syndrome)    Past Surgical History  Procedure Laterality Date  . Coronary angioplasty with stent placement    . Coronary artery bypass graft    . Appendectomy    . Total hip arthroplasty      reports that he quit smoking about 36 years ago. His smoking use included Cigarettes. He has a 20 pack-year smoking history. He has never used  smokeless tobacco. He reports that he drinks alcohol. He reports that he does not use illicit drugs. family history includes Cancer in his mother; Heart disease in his paternal grandfather and paternal grandmother. Allergies  Allergen Reactions  . Clindamycin/Lincomycin   . Keflex [Cephalexin]     rash      Review of Systems  Constitutional: Negative for fatigue.  Eyes: Negative for visual disturbance.  Respiratory: Negative for cough, chest tightness and shortness of breath.   Cardiovascular: Negative for chest pain, palpitations and leg swelling.  Endocrine: Negative for polydipsia and polyuria.  Musculoskeletal: Positive for arthralgias.  Neurological: Negative for dizziness, syncope, weakness, light-headedness and headaches.       Objective:   Physical Exam  Constitutional: He is oriented to person, place, and time. He appears well-developed and well-nourished.  Neck: Neck supple. No thyromegaly present.  Cardiovascular: Normal rate and regular rhythm.   Pulmonary/Chest: Effort normal and breath sounds normal. No respiratory distress. He has no wheezes. He has no rales.  Musculoskeletal: He exhibits no edema.  Neurological: He is alert and oriented to person, place, and time.          Assessment & Plan:   #1 type 2 diabetes. History of poor control. Add Invokana 300 mg once daily with samples provided. Recheck A1c today. Continue weight loss efforts. We also discussed option of possible short acting insulin at  meals but he prefers the former. Reassess 3 months #2 hypertension stable. Continue weight loss efforts #3 dyslipidemia greatly improved by recent labs

## 2013-11-22 ENCOUNTER — Encounter: Payer: Self-pay | Admitting: Family Medicine

## 2013-11-22 ENCOUNTER — Telehealth: Payer: Self-pay | Admitting: Family Medicine

## 2013-11-22 NOTE — Telephone Encounter (Signed)
Relevant patient education assigned to patient using Emmi. ° °

## 2013-11-23 ENCOUNTER — Telehealth: Payer: Self-pay

## 2013-11-23 NOTE — Telephone Encounter (Signed)
Relevant patient education assigned to patient using Emmi. ° °

## 2013-11-26 ENCOUNTER — Encounter: Payer: Self-pay | Admitting: Family Medicine

## 2013-11-28 ENCOUNTER — Ambulatory Visit: Payer: BC Managed Care – PPO | Admitting: Family Medicine

## 2013-11-29 ENCOUNTER — Ambulatory Visit: Payer: BC Managed Care – PPO | Admitting: Family Medicine

## 2013-11-29 ENCOUNTER — Ambulatory Visit: Payer: Managed Care, Other (non HMO) | Admitting: Internal Medicine

## 2013-11-29 DIAGNOSIS — Z0289 Encounter for other administrative examinations: Secondary | ICD-10-CM

## 2013-12-05 ENCOUNTER — Other Ambulatory Visit: Payer: Self-pay

## 2013-12-05 ENCOUNTER — Encounter: Payer: Self-pay | Admitting: Family Medicine

## 2013-12-05 MED ORDER — PANTOPRAZOLE SODIUM 40 MG PO TBEC: 40.0000 mg | DELAYED_RELEASE_TABLET | Freq: Two times a day (BID) | ORAL | Status: DC

## 2013-12-05 MED ORDER — ATENOLOL 100 MG PO TABS: 100.0000 mg | ORAL_TABLET | Freq: Two times a day (BID) | ORAL | Status: DC

## 2013-12-05 MED ORDER — LISINOPRIL-HYDROCHLOROTHIAZIDE 20-25 MG PO TABS: 1.0000 | ORAL_TABLET | Freq: Every day | ORAL | Status: DC

## 2013-12-05 MED ORDER — ALLOPURINOL 300 MG PO TABS: 300.0000 mg | ORAL_TABLET | Freq: Every day | ORAL | Status: DC

## 2013-12-06 ENCOUNTER — Ambulatory Visit: Payer: BC Managed Care – PPO | Admitting: Family Medicine

## 2013-12-10 ENCOUNTER — Other Ambulatory Visit: Payer: Self-pay

## 2013-12-10 ENCOUNTER — Encounter: Payer: Self-pay | Admitting: Family Medicine

## 2013-12-10 DIAGNOSIS — R079 Chest pain, unspecified: Secondary | ICD-10-CM

## 2013-12-10 DIAGNOSIS — R0602 Shortness of breath: Secondary | ICD-10-CM

## 2013-12-10 MED ORDER — LORAZEPAM 0.5 MG PO TABS: ORAL_TABLET | ORAL | Status: DC

## 2013-12-14 ENCOUNTER — Encounter: Payer: Self-pay | Admitting: Family Medicine

## 2013-12-17 ENCOUNTER — Encounter: Payer: Self-pay | Admitting: Family Medicine

## 2013-12-17 NOTE — Telephone Encounter (Signed)
I called pt to see if he still wanted pneumonia vac, stated that he was told he did not need it until he was 78.  Pt needs chart updated to reflect that he does not need vaccine until 64, as of now chart is saying he is "overdue".  Thanks

## 2013-12-26 ENCOUNTER — Encounter: Payer: Self-pay | Admitting: Internal Medicine

## 2013-12-28 ENCOUNTER — Telehealth: Payer: Self-pay | Admitting: Internal Medicine

## 2013-12-28 ENCOUNTER — Encounter: Payer: Self-pay | Admitting: Internal Medicine

## 2013-12-28 ENCOUNTER — Encounter: Payer: Self-pay | Admitting: Family Medicine

## 2013-12-28 NOTE — Telephone Encounter (Signed)
Responded to pt through mychart 

## 2013-12-28 NOTE — Telephone Encounter (Signed)
New Message:  Pt called in reference to his appt. I had called and left a voice mail informing him that his appt had been changed from 10:30 to 9am on 4/13 and to call back to confirm or reschedule (To evenly spread out Dr. Alan Ripper appts.).Marland KitchenMarland Kitchen Pt called back to complain and states he does not want to come in at 9 and he does not want to reschedule. PT states he just wants to change cardiologist. Pt also wanted me to send a message to Dr. Harrington Challenger letting her know he wants to leave her care.

## 2014-01-03 ENCOUNTER — Ambulatory Visit: Payer: Managed Care, Other (non HMO) | Admitting: Pulmonary Disease

## 2014-01-04 ENCOUNTER — Encounter: Payer: Self-pay | Admitting: Cardiology

## 2014-01-07 ENCOUNTER — Encounter: Payer: Self-pay | Admitting: Cardiology

## 2014-01-07 NOTE — Progress Notes (Signed)
Patient ID: Adam [name], male   DOB: 1951-07-27, 63 y.o.   MRN: [id number]   Adam [name]    Date of visit:  01/07/2014 DOB:  02-Dec-1950    Age:  63 yrs. Medical record number:  [id number] Account number:  [id number] Primary Care Provider: Carolann [name] ____________________________ CURRENT DIAGNOSES  1. CAD,Native  2. Diabetes Mellitus-IDD  3. Hypertension,Essential (Benign)  4. COPD  5. Asthma  6. Surgery-Aortocoronary Bypass Grafting  7. Hyperlipidemia  8. Obesity(BMI30-40) ____________________________ ALLERGIES  Cephalexin, Intolerance-unknown  Clindamycin, Rash ____________________________ MEDICATIONS  1. albuterol sulfate HFA 90 mcg/actuation aerosol inhaler, PRN  2. allopurinol 300 mg tablet, 1 p.o. daily  3. aspirin 81 mg chewable tablet, 1 p.o. daily  4. atenolol 100 mg tablet, BID  5. multivitamin tablet, 1 p.o. daily  6. Vitamin D3 1,000 unit tablet, 5 qd  7. clobetasol 0.05 % topical cream, bid  8. CoQ-10 100 mg capsule, 200 mg qd  9. Vytorin 10 mg-80 mg tablet, 1 p.o. daily  10. fenofibrate micronized 134 mg capsule, 1 p.o. daily  11. Flonase Allergy Relief 50 mcg/actuation nasal spray,suspension, PRN  12. Flovent HFA 110 mcg/actuation aerosol inhaler, q 12 hrs  13. Advair Diskus 250 mcg-50 mcg/dose powder for inhalation, BID  14. lisinopril 20 mg-hydrochlorothiazide 25 mg tablet, 1 p.o. daily  15. lorazepam 0.5 mg tablet, tid   2 qhs  16. magnesium oxide 400 mg tablet, 1 p.o. daily  17. Singulair 10 mg tablet, qd  18. pantoprazole 40 mg tablet,delayed release, BID  19. Pataday 0.2 % eye drops, 1 gtt ou qd  20. Mirapex 0.125 mg tablet, qhs  21. Vitamin B-12 1,000 mcg tablet, 1 p.o. daily  22. Vitamin C 100 mg tablet, 400mg  qd  23. Levemir FlexTouch 100 unit/mL (3 mL) subcutaneous insulin pen, 80u qam  24. metformin 1,000 mg tablet, BID ____________________________ CHIEF COMPLAINTS  Establishing new cardiologist ____________________________ HISTORY OF  PRESENT ILLNESS  Patient seen to establish for Cardiologic care. The patient has a complex history of hypertension, obesity and hyperlipidemia. In 1995 he had a myocardial infarction while traveling [place] clinically and was taken care of when he got back to taking him. He underwent a series of multiple percutaneous interventions over the intervening years and ultimately had bypass grafting as detailed below while living in [place]. He has done well since the bypass surgery without angina and denies claudication, PND, orthopnea or edema. He recently retired and moved to [place] and is now in the process of establishing cardiac care. He saw another physician in town and wishes to change care to this practice now.  He had a hip replacement last year and is currently riding a stationary bicycle and tried to exercise several days per week. He was diagnosed with diabetes mellitus and although he has no complications from and has gained about 15 pounds of weight and is currently on insulin. He also drinks about 4-5 drinks per day of scotch. He had a myocardial perfusion scan done in 2013 following bypass grafting that was negative. His ventricular function is normal. ____________________________ PAST HISTORY  Past Medical Illnesses:  hypertension, COPD, restless legs, DM-non-insulin dependent, anxiety, asthma, history of pancreatitis, gout;  Cardiovascular Illnesses:  CAD, atrial fibrillation;  Surgical Procedures:  appendectomy, hip replacement-left, tonsillectomy/adenoids, CABG, fx rt ankle;  Cardiology Procedures-Invasive:  cardiac cath (left), Multiple prior stents, CABG 12/01/2009 (LIMA to LAD; RIMA to Diag; L radial artery to LCx; SVG to PDA, SVG to PLSA; ? SVG to post vent branch);  Cardiology Procedures-Noninvasive:  treadmill cardiolite September 2013, echocardiogram February 2013;  LVEF of 60% documented via echocardiogram on 05/30/2012,   ____________________________ CARDIO-PULMONARY TEST  DATES EKG Date:  01/07/2014;  CABG: 12/01/2009;  Nuclear Study Date:  06/13/2012;  Echocardiography Date: 11/30/2011;   ____________________________ FAMILY HISTORY Brother -- Brother alive with problem, Leukemia Brother -- Brother dead, Suicide Brother -- Brother alive and well Father -- Father dead, Unknown disease Mother -- Mother dead, Bowel cancer, Diabetes mellitus Sister -- Sister alive with problem, Asthma Sister -- Sister alive with problem, Hypothyroidism ____________________________ SOCIAL HISTORY Alcohol Use:  scotch;  Smoking:  20 pack year history;  Diet:  carbohydrate modified diet;  Lifestyle:  married;  Exercise:  exercises daily;  Occupation:  retired, Risk analyst;  Residence:  lives with wife;   ____________________________ REVIEW OF SYSTEMS General:  weight gain of approximately 15 lbs  Integumentary:no rashes or new skin lesions. Eyes: denies diplopia, history of glaucoma or visual problems. Ears, Nose, Throat, Mouth:  denies any hearing loss, epistaxis, hoarseness or difficulty speaking. Respiratory: dyspnea with exertion Cardiovascular:  please review HPI Abdominal: history of pancreatitis Genitourinary-Male: erectile dysfunction, nocturia  Musculoskeletal:  arthritis of the right hip Neurological:  denies headaches, stroke, or TIA Psychiatric:  denies depession or anxiety  ____________________________ PHYSICAL EXAMINATION VITAL SIGNS  Blood Pressure:  142/80 Sitting, Right arm, large cuff  , 146/80 Standing, Right arm and large cuff   Pulse:  78/min. Weight:  256.00 lbs. Height:  70"BMI: 36  Constitutional:  pleasant white male in no acute distress, moderately obese Skin:  warm and dry to touch, no apparent skin lesions, or masses noted. Head:  normocephalic, normal hair pattern, no masses or tenderness Eyes:  EOMS Intact, PERRLA, C and S clear, Funduscopic exam not done. ENT:  ears, nose and throat reveal no gross abnormalities.  Dentition good. Neck:  supple,  without massess. No JVD, thyromegaly or carotid bruits. Carotid upstroke normal. Chest:  normal symmetry, clear to auscultation., healed median sternotomy scar Cardiac:  regular rhythm, normal S1 and S2, No S3 or S4, no murmurs, gallops or rubs detected. Abdomen:  abdomen soft,non-tender, no masses, no hepatospenomegaly, or aneurysm noted Peripheral Pulses:  the femoral,dorsalis pedis, and posterior tibial pulses are full and equal bilaterally with no bruits auscultated. Extremities & Back:  well healed saphenous vein donor site RLE, well healed radial harvest site left arm, mild bilateral venous insufficiency changes present, no edema present Neurological:  no gross motor or sensory deficits noted, affect appropriate, oriented x3. ____________________________ MOST RECENT LIPID PANEL 08/14/13  CHOL TOTL 116 mg/dl, LDL 49 NM, HDL 32 mg/dl, TRIGLYCER 180 mg/dl and CHOL/HDL 4 (Calc) ____________________________ IMPRESSIONS/PLAN  1. Coronary artery disease with previous bypass grafting and previous multiple percutaneous interventions 2. Insulin-dependent diabetes 3. Obesity 4. Hypertension currently above goal 5. Hyperlipidemia under treatment 6. Heavy alcohol intake  Recommendations:  His current medical regimen is adequate. He is obese and needs to lose weight and we discussed this in detail. We discussed reducing his alcohol intake to one drink per day and getting more regular exercise. We talked about carbohydrate restriction in his diet. Otherwise I will see him in followup in 6 months. ____________________________ TODAYS ORDERS  1. 12 Lead EKG: Today  2. Return Visit: 6 months                       ____________________________ Cardiology Physician:  Kerry Hough MD [facility]

## 2014-01-14 ENCOUNTER — Other Ambulatory Visit: Payer: Self-pay

## 2014-01-14 ENCOUNTER — Ambulatory Visit: Payer: BC Managed Care – PPO | Admitting: Internal Medicine

## 2014-01-14 ENCOUNTER — Encounter: Payer: Self-pay | Admitting: Family Medicine

## 2014-01-14 MED ORDER — METFORMIN HCL 1000 MG PO TABS: 1000.0000 mg | ORAL_TABLET | Freq: Two times a day (BID) | ORAL | Status: DC

## 2014-01-19 ENCOUNTER — Other Ambulatory Visit: Payer: Self-pay | Admitting: Family Medicine

## 2014-01-21 ENCOUNTER — Encounter: Payer: Self-pay | Admitting: Family Medicine

## 2014-01-21 ENCOUNTER — Other Ambulatory Visit: Payer: Self-pay

## 2014-01-21 DIAGNOSIS — R0602 Shortness of breath: Secondary | ICD-10-CM

## 2014-01-21 DIAGNOSIS — R079 Chest pain, unspecified: Secondary | ICD-10-CM

## 2014-01-21 MED ORDER — LORAZEPAM 0.5 MG PO TABS: ORAL_TABLET | ORAL | Status: DC

## 2014-01-27 ENCOUNTER — Encounter: Payer: Self-pay | Admitting: Family Medicine

## 2014-01-28 ENCOUNTER — Other Ambulatory Visit: Payer: Self-pay

## 2014-01-28 MED ORDER — EZETIMIBE-SIMVASTATIN 10-80 MG PO TABS: 1.0000 | ORAL_TABLET | Freq: Every day | ORAL | Status: DC

## 2014-02-01 ENCOUNTER — Telehealth: Payer: Self-pay

## 2014-02-01 ENCOUNTER — Encounter: Payer: Self-pay | Admitting: Family Medicine

## 2014-02-01 NOTE — Telephone Encounter (Signed)
Yes at [facility]

## 2014-02-01 NOTE — Telephone Encounter (Signed)
Can you please do a prior authorization for the patient on his Vytorin medication?

## 2014-02-01 NOTE — Telephone Encounter (Signed)
I called [employer] and was told that [employer] ran the medication before the PA was received.  It should be okay now.

## 2014-02-01 NOTE — Telephone Encounter (Signed)
I submitted it on 01/30/14 and it was approved.  Is it being held up at the pharmacy still??

## 2014-02-04 ENCOUNTER — Other Ambulatory Visit: Payer: Self-pay

## 2014-02-04 ENCOUNTER — Encounter: Payer: Self-pay | Admitting: Family Medicine

## 2014-02-04 MED ORDER — EZETIMIBE-SIMVASTATIN 10-80 MG PO TABS: 1.0000 | ORAL_TABLET | Freq: Every day | ORAL | Status: DC

## 2014-02-05 ENCOUNTER — Other Ambulatory Visit: Payer: Self-pay | Admitting: *Deleted

## 2014-02-05 MED ORDER — EZETIMIBE-SIMVASTATIN 10-80 MG PO TABS: 1.0000 | ORAL_TABLET | Freq: Every day | ORAL | Status: DC

## 2014-02-06 ENCOUNTER — Encounter: Payer: Self-pay | Admitting: Family Medicine

## 2014-02-06 ENCOUNTER — Other Ambulatory Visit: Payer: Self-pay

## 2014-02-06 MED ORDER — FENOFIBRIC ACID 135 MG PO CPDR: DELAYED_RELEASE_CAPSULE | ORAL | Status: DC

## 2014-02-11 ENCOUNTER — Telehealth: Payer: Self-pay | Admitting: Family Medicine

## 2014-02-11 NOTE — Telephone Encounter (Signed)
error 

## 2014-02-18 ENCOUNTER — Encounter: Payer: Self-pay | Admitting: Family Medicine

## 2014-02-18 ENCOUNTER — Ambulatory Visit (INDEPENDENT_AMBULATORY_CARE_PROVIDER_SITE_OTHER): Payer: BC Managed Care – PPO | Admitting: Family Medicine

## 2014-02-18 VITALS — BP 128/90 | HR 92 | Temp 97.6°F | Ht 70.0 in | Wt 253.0 lb

## 2014-02-18 DIAGNOSIS — E119 Type 2 diabetes mellitus without complications: Secondary | ICD-10-CM

## 2014-02-18 DIAGNOSIS — E1165 Type 2 diabetes mellitus with hyperglycemia: Secondary | ICD-10-CM

## 2014-02-18 MED ORDER — MONTELUKAST SODIUM 10 MG PO TABS: 10.0000 mg | ORAL_TABLET | Freq: Every day | ORAL | Status: DC

## 2014-02-18 NOTE — Progress Notes (Signed)
Pre visit review using our clinic review tool, if applicable. No additional management support is needed unless otherwise documented below in the visit note. 

## 2014-02-18 NOTE — Progress Notes (Signed)
   Subjective:    Patient ID: Adam [name], male    DOB: 13-Aug-1951, 63 y.o.   MRN: [id number]  HPI Followup type 2 diabetes. Patient had A1c back in November 6 0.4%. We had provided samples of Invokana but he did not take this because A1c was well controlled. However, in the following 2 months his blood sugar started elevating. He started Invokana 300 mg once daily about 3 weeks ago and blood sugars are starting to improve again. His weight is down 5 pounds from last visit. Exercise with cycling exercise bike 45 minutes 5 days per week. No recent chest pains. No symptoms of hyperglycemia. No hypoglycemia. He also remains on Levemir insulin 80 units once daily and metformin.  Past Medical History  Diagnosis Date  . Hypertension   . Coronary artery disease     x 6  . COPD (chronic obstructive pulmonary disease)   . Asthma   . Arthritis   . Depression   . GERD (gastroesophageal reflux disease)   . Allergy   . Hyperlipidemia   . Family history of polyps in the colon   . Anxiety     history of PTSD following CABG  . Gout   . Prediabetes   . H/O atrial fibrillation without current medication     following CABG with no documented episodes since then.  . RLS (restless legs syndrome)    Past Surgical History  Procedure Laterality Date  . Coronary angioplasty with stent placement    . Coronary artery bypass graft    . Appendectomy    . Total hip arthroplasty      reports that he quit smoking about 36 years ago. His smoking use included Cigarettes. He has a 20 pack-year smoking history. He has never used smokeless tobacco. He reports that he drinks alcohol. He reports that he does not use illicit drugs. family history includes Cancer in his mother; Heart disease in his paternal grandfather and paternal grandmother. Allergies  Allergen Reactions  . Clindamycin/Lincomycin   . Keflex [Cephalexin]     rash      Review of Systems  Constitutional: Negative for fatigue.  Eyes:  Negative for visual disturbance.  Respiratory: Negative for cough, chest tightness and shortness of breath.   Cardiovascular: Negative for chest pain, palpitations and leg swelling.  Neurological: Negative for dizziness, syncope, weakness, light-headedness and headaches.       Objective:   Physical Exam  Constitutional: He appears well-developed and well-nourished.  Cardiovascular: Normal rate and regular rhythm.   Pulmonary/Chest: Effort normal and breath sounds normal. No respiratory distress. He has no wheezes. He has no rales.  Musculoskeletal: He exhibits no edema.          Assessment & Plan:  Type 2 diabetes. Improving control. Continue weight loss efforts. Recheck A1c in one month. We elected to wait since he's only been taking the new medication as above over the past 3 weeks.

## 2014-03-10 ENCOUNTER — Other Ambulatory Visit: Payer: Self-pay | Admitting: Internal Medicine

## 2014-03-15 ENCOUNTER — Telehealth: Payer: Self-pay

## 2014-03-15 ENCOUNTER — Encounter: Payer: Self-pay | Admitting: Family Medicine

## 2014-03-15 DIAGNOSIS — R079 Chest pain, unspecified: Secondary | ICD-10-CM

## 2014-03-15 DIAGNOSIS — R0602 Shortness of breath: Secondary | ICD-10-CM

## 2014-03-15 MED ORDER — LORAZEPAM 0.5 MG PO TABS: ORAL_TABLET | ORAL | Status: DC

## 2014-03-15 NOTE — Telephone Encounter (Signed)
Refill but would try not to exceed one every 6 hours, as discussed.

## 2014-03-15 NOTE — Telephone Encounter (Signed)
Called in Rx to pharmacy.

## 2014-03-15 NOTE — Telephone Encounter (Signed)
Lorazepam  Last visit 02/18/14 Last refill 01/21/14 #120 1 refill

## 2014-03-21 ENCOUNTER — Encounter: Payer: Self-pay | Admitting: Family Medicine

## 2014-03-25 ENCOUNTER — Other Ambulatory Visit: Payer: Self-pay

## 2014-03-25 ENCOUNTER — Other Ambulatory Visit (INDEPENDENT_AMBULATORY_CARE_PROVIDER_SITE_OTHER): Payer: BC Managed Care – PPO

## 2014-03-25 ENCOUNTER — Encounter: Payer: Self-pay | Admitting: Family Medicine

## 2014-03-25 DIAGNOSIS — E1165 Type 2 diabetes mellitus with hyperglycemia: Secondary | ICD-10-CM

## 2014-03-25 DIAGNOSIS — E119 Type 2 diabetes mellitus without complications: Secondary | ICD-10-CM

## 2014-03-25 LAB — HEMOGLOBIN A1C: Hgb A1c MFr Bld: 6.3 % (ref 4.6–6.5)

## 2014-03-25 MED ORDER — INSULIN PEN NEEDLE 32G X 4 MM MISC: Status: DC

## 2014-04-12 ENCOUNTER — Encounter: Payer: Self-pay | Admitting: Family Medicine

## 2014-04-12 ENCOUNTER — Other Ambulatory Visit: Payer: Self-pay

## 2014-04-12 DIAGNOSIS — R0602 Shortness of breath: Secondary | ICD-10-CM

## 2014-04-12 MED ORDER — LORAZEPAM 0.5 MG PO TABS: ORAL_TABLET | ORAL | Status: DC

## 2014-04-18 ENCOUNTER — Encounter: Payer: Self-pay | Admitting: Family Medicine

## 2014-04-18 ENCOUNTER — Other Ambulatory Visit: Payer: Self-pay

## 2014-04-18 MED ORDER — CANAGLIFLOZIN 300 MG PO TABS: 1.0000 | ORAL_TABLET | Freq: Every day | ORAL | Status: DC

## 2014-05-05 ENCOUNTER — Other Ambulatory Visit: Payer: Self-pay | Admitting: Family Medicine

## 2014-05-13 ENCOUNTER — Encounter: Payer: Self-pay | Admitting: Family Medicine

## 2014-05-14 ENCOUNTER — Other Ambulatory Visit: Payer: Self-pay

## 2014-05-14 ENCOUNTER — Encounter: Payer: Self-pay | Admitting: Family Medicine

## 2014-05-14 ENCOUNTER — Other Ambulatory Visit: Payer: Self-pay | Admitting: Family Medicine

## 2014-05-14 DIAGNOSIS — E1165 Type 2 diabetes mellitus with hyperglycemia: Secondary | ICD-10-CM

## 2014-05-14 DIAGNOSIS — E785 Hyperlipidemia, unspecified: Secondary | ICD-10-CM

## 2014-05-14 DIAGNOSIS — R0602 Shortness of breath: Secondary | ICD-10-CM

## 2014-05-14 MED ORDER — FLUTICASONE-SALMETEROL 250-50 MCG/DOSE IN AEPB: INHALATION_SPRAY | RESPIRATORY_TRACT | Status: DC

## 2014-05-14 MED ORDER — LORAZEPAM 0.5 MG PO TABS: ORAL_TABLET | ORAL | Status: DC

## 2014-05-15 ENCOUNTER — Other Ambulatory Visit (INDEPENDENT_AMBULATORY_CARE_PROVIDER_SITE_OTHER): Payer: BC Managed Care – PPO

## 2014-05-15 DIAGNOSIS — E1165 Type 2 diabetes mellitus with hyperglycemia: Secondary | ICD-10-CM

## 2014-05-15 DIAGNOSIS — E785 Hyperlipidemia, unspecified: Secondary | ICD-10-CM

## 2014-05-15 DIAGNOSIS — E119 Type 2 diabetes mellitus without complications: Secondary | ICD-10-CM

## 2014-05-15 LAB — HEPATIC FUNCTION PANEL
ALT: 57 U/L — ABNORMAL HIGH (ref 0–53)
AST: 58 U/L — AB (ref 0–37)
Albumin: 4.3 g/dL (ref 3.5–5.2)
Alkaline Phosphatase: 28 U/L — ABNORMAL LOW (ref 39–117)
BILIRUBIN TOTAL: 0.8 mg/dL (ref 0.2–1.2)
Bilirubin, Direct: 0.1 mg/dL (ref 0.0–0.3)
TOTAL PROTEIN: 7.2 g/dL (ref 6.0–8.3)

## 2014-05-15 LAB — BASIC METABOLIC PANEL
BUN: 16 mg/dL (ref 6–23)
CALCIUM: 10 mg/dL (ref 8.4–10.5)
CO2: 25 mEq/L (ref 19–32)
Chloride: 100 mEq/L (ref 96–112)
Creatinine, Ser: 1.2 mg/dL (ref 0.4–1.5)
GFR: 66.73 mL/min (ref >60.00)
GLUCOSE: 112 mg/dL — AB (ref 70–99)
Potassium: 3.5 mEq/L (ref 3.5–5.1)
Sodium: 138 mEq/L (ref 135–145)

## 2014-05-15 LAB — LIPID PANEL
CHOLESTEROL: 160 mg/dL (ref 0–200)
HDL: 38 mg/dL — ABNORMAL LOW (ref >39.00)
NonHDL: 122
Total CHOL/HDL Ratio: 4
Triglycerides: 201 mg/dL — ABNORMAL HIGH (ref 0.0–149.0)
VLDL: 40.2 mg/dL — ABNORMAL HIGH (ref 0.0–40.0)

## 2014-05-15 LAB — HEMOGLOBIN A1C: Hgb A1c MFr Bld: 6.1 % (ref 4.6–6.5)

## 2014-05-15 LAB — LDL CHOLESTEROL, DIRECT: LDL DIRECT: 102.8 mg/dL

## 2014-05-16 ENCOUNTER — Other Ambulatory Visit: Payer: Self-pay

## 2014-05-16 MED ORDER — FLUTICASONE-SALMETEROL 250-50 MCG/DOSE IN AEPB: 1.0000 | INHALATION_SPRAY | Freq: Two times a day (BID) | RESPIRATORY_TRACT | Status: DC

## 2014-05-17 ENCOUNTER — Other Ambulatory Visit: Payer: Self-pay

## 2014-05-17 ENCOUNTER — Encounter: Payer: Self-pay | Admitting: Family Medicine

## 2014-05-17 MED ORDER — FLUTICASONE-SALMETEROL 250-50 MCG/DOSE IN AEPB: 1.0000 | INHALATION_SPRAY | Freq: Two times a day (BID) | RESPIRATORY_TRACT | Status: DC

## 2014-05-22 ENCOUNTER — Ambulatory Visit (INDEPENDENT_AMBULATORY_CARE_PROVIDER_SITE_OTHER): Payer: BC Managed Care – PPO | Admitting: Family Medicine

## 2014-05-22 ENCOUNTER — Encounter: Payer: Self-pay | Admitting: Family Medicine

## 2014-05-22 VITALS — BP 130/80 | HR 81 | Temp 98.0°F | Wt 255.0 lb

## 2014-05-22 DIAGNOSIS — E119 Type 2 diabetes mellitus without complications: Secondary | ICD-10-CM

## 2014-05-22 DIAGNOSIS — R7402 Elevation of levels of lactic acid dehydrogenase (LDH): Secondary | ICD-10-CM

## 2014-05-22 DIAGNOSIS — Z23 Encounter for immunization: Secondary | ICD-10-CM

## 2014-05-22 DIAGNOSIS — I10 Essential (primary) hypertension: Secondary | ICD-10-CM

## 2014-05-22 DIAGNOSIS — R7401 Elevation of levels of liver transaminase levels: Secondary | ICD-10-CM

## 2014-05-22 DIAGNOSIS — R740 Nonspecific elevation of levels of transaminase and lactic acid dehydrogenase [LDH]: Secondary | ICD-10-CM

## 2014-05-22 NOTE — Progress Notes (Signed)
Subjective:    Patient ID: Adam [name], male    DOB: [birth year], 63 y.o.   MRN: [id number]  HPI Patient for medical followup  He has history of CAD with multiple prior cardiac stent procedures. No recent chest pains. He continues exercise 45 minutes daily with cycling mostly.  Type 2 diabetes. Recent A1c 6.1%. No recent hypoglycemia. Overall feels well.  History of mild chronic elevated liver transaminases. He continues to use alcohol daily. He still drinks about 3-4 drinks of scotch per day. He has scaled back considerably over the past couple of years.  Patient has history of chronic anxiety. He relates posttraumatic stress related previous hospital procedures. Had extensive counseling in the past. We did recently taper back his lorazepam and he has not been happy with this. We explained the rationale for trying to reduce this mostly in terms of reducing risk of falls as he gets older and to minimize side effects to medication  Past Medical History  Diagnosis Date  . Hypertension   . Coronary artery disease     x 6  . COPD (chronic obstructive pulmonary disease)   . Asthma   . Arthritis   . Depression   . GERD (gastroesophageal reflux disease)   . Allergy   . Hyperlipidemia   . Family history of polyps in the colon   . Anxiety     history of PTSD following CABG  . Gout   . Prediabetes   . H/O atrial fibrillation without current medication     following CABG with no documented episodes since then.  . RLS (restless legs syndrome)    Past Surgical History  Procedure Laterality Date  . Coronary angioplasty with stent placement    . Coronary artery bypass graft    . Appendectomy    . Total hip arthroplasty      reports that he quit smoking about 36 years ago. His smoking use included Cigarettes. He has a 20 pack-year smoking history. He has never used smokeless tobacco. He reports that he drinks alcohol. He reports that he does not use illicit drugs. family history  includes Cancer in his mother; Heart disease in his paternal grandfather and paternal grandmother. Allergies  Allergen Reactions  . Clindamycin/Lincomycin   . Keflex [Cephalexin]     rash      Review of Systems  Constitutional: Negative for fatigue.  Eyes: Negative for visual disturbance.  Respiratory: Negative for cough, chest tightness and shortness of breath.   Cardiovascular: Negative for chest pain, palpitations and leg swelling.  Neurological: Negative for dizziness, syncope, weakness, light-headedness and headaches.       Objective:   Physical Exam  Constitutional: He is oriented to person, place, and time. He appears well-developed and well-nourished.  HENT:  Right Ear: External ear normal.  Left Ear: External ear normal.  Mouth/Throat: Oropharynx is clear and moist.  Eyes: Pupils are equal, round, and reactive to light.  Neck: Neck supple. No thyromegaly present.  Cardiovascular: Normal rate and regular rhythm.   Pulmonary/Chest: Effort normal and breath sounds normal. No respiratory distress. He has no wheezes. He has no rales.  Musculoskeletal: He exhibits no edema.  Neurological: He is alert and oriented to person, place, and time.          Assessment & Plan:  #1 type 2 diabetes. Improving control. We will try to slowly taper back Levemir dosage with instructions given. #2 hypertension. Stable #3 liver transaminase elevations. Mostly AST elevation. Suspect related to alcohol.  Discussion with patient regarding risk of excessive alcohol and advise further scaling back #4 health maintenance. Flu vaccine given #5 chronic anxiety. We discussed appropriate treatment for posttraumatic stress disorder. Had extensive counseling in the past.

## 2014-05-22 NOTE — Patient Instructions (Signed)
Consider reduce Levemir 2 units every 2 days as long as fasting sugars < 130.

## 2014-05-22 NOTE — Progress Notes (Signed)
Pre visit review using our clinic review tool, if applicable. No additional management support is needed unless otherwise documented below in the visit note. 

## 2014-06-03 ENCOUNTER — Encounter: Payer: Self-pay | Admitting: Family Medicine

## 2014-06-04 ENCOUNTER — Ambulatory Visit (INDEPENDENT_AMBULATORY_CARE_PROVIDER_SITE_OTHER): Payer: BC Managed Care – PPO | Admitting: Family Medicine

## 2014-06-04 ENCOUNTER — Encounter: Payer: Self-pay | Admitting: Family Medicine

## 2014-06-04 VITALS — BP 140/82 | HR 86 | Temp 97.5°F | Wt 257.0 lb

## 2014-06-04 DIAGNOSIS — M25559 Pain in unspecified hip: Secondary | ICD-10-CM

## 2014-06-04 DIAGNOSIS — M25552 Pain in left hip: Secondary | ICD-10-CM

## 2014-06-04 MED ORDER — METHOCARBAMOL 500 MG PO TABS: 500.0000 mg | ORAL_TABLET | Freq: Four times a day (QID) | ORAL | Status: DC | PRN

## 2014-06-04 MED ORDER — MELOXICAM 15 MG PO TABS: 15.0000 mg | ORAL_TABLET | Freq: Every day | ORAL | Status: DC

## 2014-06-04 NOTE — Progress Notes (Signed)
   Subjective:    Patient ID: Adam [name], male    DOB: [birth year], 63 y.o.   MRN: [id number]  Hip Pain  Pertinent negatives include no numbness.   Patient seen with left hip pain. He has history of left total replacement over a couple years ago. He was concerned initially he may of had some issues with that. He denies any recent injury. About 10 days ago started having pain with ambulation. His pain is mostly in left buttock with some radiation toward the left thigh. He denies any weakness or numbness. No loss of urine or stool control. His pain is worse with ambulation. He has had sensation of muscle spasms off and on. It improved slightly with massage.  Past Medical History  Diagnosis Date  . Hypertension   . Coronary artery disease     x 6  . COPD (chronic obstructive pulmonary disease)   . Asthma   . Arthritis   . Depression   . GERD (gastroesophageal reflux disease)   . Allergy   . Hyperlipidemia   . Family history of polyps in the colon   . Anxiety     history of PTSD following CABG  . Gout   . Prediabetes   . H/O atrial fibrillation without current medication     following CABG with no documented episodes since then.  . RLS (restless legs syndrome)    Past Surgical History  Procedure Laterality Date  . Coronary angioplasty with stent placement    . Coronary artery bypass graft    . Appendectomy    . Total hip arthroplasty      reports that he quit smoking about 36 years ago. His smoking use included Cigarettes. He has a 20 pack-year smoking history. He has never used smokeless tobacco. He reports that he drinks alcohol. He reports that he does not use illicit drugs. family history includes Cancer in his mother; Heart disease in his paternal grandfather and paternal grandmother. Allergies  Allergen Reactions  . Clindamycin/Lincomycin   . Keflex [Cephalexin]     rash      Review of Systems  Gastrointestinal: Negative for abdominal pain.  Genitourinary:  Negative for dysuria.  Musculoskeletal: Positive for back pain.  Neurological: Negative for weakness and numbness.       Objective:   Physical Exam  Constitutional: He appears well-developed and well-nourished.  Cardiovascular: Normal rate and regular rhythm.   Musculoskeletal: He exhibits no edema.  Straight leg raise are negative. Left hip shows good range of motion. No reproducible point tenderness. No leg edema.          Assessment & Plan:  Left lumbar back pain and hip pain. Question left lumbar radiculopathy pains. Doubt coming from left hip joint. Short-term use of meloxicam 15 mg once daily. Robaxin 500 mg Q8 hours when necessary muscle spasm. Consider orthopedic referral if no better in one week

## 2014-06-04 NOTE — Patient Instructions (Signed)
Touch base in one week if no better.

## 2014-06-04 NOTE — Progress Notes (Signed)
Pre visit review using our clinic review tool, if applicable. No additional management support is needed unless otherwise documented below in the visit note. 

## 2014-06-12 ENCOUNTER — Encounter: Payer: Self-pay | Admitting: Family Medicine

## 2014-06-14 ENCOUNTER — Encounter: Payer: Self-pay | Admitting: Family Medicine

## 2014-06-17 ENCOUNTER — Encounter: Payer: Self-pay | Admitting: Family Medicine

## 2014-06-17 ENCOUNTER — Other Ambulatory Visit: Payer: Self-pay

## 2014-06-17 DIAGNOSIS — R0602 Shortness of breath: Secondary | ICD-10-CM

## 2014-06-17 MED ORDER — LORAZEPAM 0.5 MG PO TABS: ORAL_TABLET | ORAL | Status: DC

## 2014-06-30 ENCOUNTER — Other Ambulatory Visit: Payer: Self-pay

## 2014-07-03 ENCOUNTER — Other Ambulatory Visit: Payer: Self-pay | Admitting: Family Medicine

## 2014-07-09 ENCOUNTER — Encounter: Payer: Self-pay | Admitting: Family Medicine

## 2014-07-09 ENCOUNTER — Other Ambulatory Visit: Payer: Self-pay

## 2014-07-09 MED ORDER — FENOFIBRIC ACID 135 MG PO CPDR: DELAYED_RELEASE_CAPSULE | ORAL | Status: DC

## 2014-08-05 ENCOUNTER — Encounter: Payer: Self-pay | Admitting: Family Medicine

## 2014-08-05 ENCOUNTER — Ambulatory Visit (INDEPENDENT_AMBULATORY_CARE_PROVIDER_SITE_OTHER): Payer: BC Managed Care – PPO | Admitting: Family Medicine

## 2014-08-05 VITALS — BP 132/78 | HR 76 | Temp 97.5°F | Wt 256.0 lb

## 2014-08-05 DIAGNOSIS — J208 Acute bronchitis due to other specified organisms: Secondary | ICD-10-CM

## 2014-08-05 MED ORDER — LEVOFLOXACIN 500 MG PO TABS: 500.0000 mg | ORAL_TABLET | Freq: Every day | ORAL | Status: DC

## 2014-08-05 NOTE — Progress Notes (Signed)
   Subjective:    Patient ID: Adam [name], male    DOB: 1950/12/02, 63 y.o.   MRN: [id number]  HPI   Patient is an ex-smoker seen with 3 week history of cough. He has reported COPD and has seen pulmonologist in the past. Takes Advair but only once daily. His cough has been productive of thick green sputum often known. Possibly some mild wheezing off and on. No dyspnea. Has had some persistent sinus pressure. No fever. He tried over-the-counter NyQuil without much relief. He has some leftover Robitussin-AC which does help his cough somewhat.  Past Medical History  Diagnosis Date  . Hypertension   . Coronary artery disease     x 6  . COPD (chronic obstructive pulmonary disease)   . Asthma   . Arthritis   . Depression   . GERD (gastroesophageal reflux disease)   . Allergy   . Hyperlipidemia   . Family history of polyps in the colon   . Anxiety     history of PTSD following CABG  . Gout   . Prediabetes   . H/O atrial fibrillation without current medication     following CABG with no documented episodes since then.  . RLS (restless legs syndrome)    Past Surgical History  Procedure Laterality Date  . Coronary angioplasty with stent placement    . Coronary artery bypass graft    . Appendectomy    . Total hip arthroplasty      reports that he quit smoking about 36 years ago. His smoking use included Cigarettes. He has a 20 pack-year smoking history. He has never used smokeless tobacco. He reports that he drinks alcohol. He reports that he does not use illicit drugs. family history includes Cancer in his mother; Heart disease in his paternal grandfather and paternal grandmother. Allergies  Allergen Reactions  . Clindamycin/Lincomycin   . Keflex [Cephalexin]     rash      Review of Systems  Constitutional: Positive for fatigue.  HENT: Positive for sinus pressure.   Respiratory: Positive for cough. Negative for shortness of breath.   Cardiovascular: Negative for chest pain.        Objective:   Physical Exam  Constitutional: He appears well-developed and well-nourished.  HENT:  Mouth/Throat: Oropharynx is clear and moist.  Neck: Neck supple.  Cardiovascular: Normal rate and regular rhythm.   Pulmonary/Chest: Effort normal and breath sounds normal. No respiratory distress. He has no wheezes. He has no rales.          Assessment & Plan:  Persistent cough. Patient has COPD with likely exacerbation. No active wheezing noted this time. We've recommended no steroids if possible -especially with type 2 diabetes. Given duration of productive cough start Levaquin 500 mg once daily for 7 days. Touch base if no better in 1-2 weeks.

## 2014-08-05 NOTE — Patient Instructions (Signed)

## 2014-08-05 NOTE — Progress Notes (Signed)
Pre visit review using our clinic review tool, if applicable. No additional management support is needed unless otherwise documented below in the visit note. 

## 2014-08-08 ENCOUNTER — Encounter: Payer: Self-pay | Admitting: Family Medicine

## 2014-08-09 ENCOUNTER — Other Ambulatory Visit: Payer: Self-pay

## 2014-08-09 MED ORDER — FLUTICASONE-SALMETEROL 250-50 MCG/DOSE IN AEPB: 1.0000 | INHALATION_SPRAY | Freq: Two times a day (BID) | RESPIRATORY_TRACT | Status: DC

## 2014-08-12 ENCOUNTER — Encounter: Payer: Self-pay | Admitting: Family Medicine

## 2014-08-20 ENCOUNTER — Encounter: Payer: Self-pay | Admitting: Family Medicine

## 2014-08-26 ENCOUNTER — Encounter: Payer: Self-pay | Admitting: Family Medicine

## 2014-08-26 ENCOUNTER — Other Ambulatory Visit: Payer: Self-pay | Admitting: Family Medicine

## 2014-09-11 LAB — HM DIABETES EYE EXAM

## 2014-09-15 ENCOUNTER — Other Ambulatory Visit: Payer: Self-pay | Admitting: Family Medicine

## 2014-09-16 ENCOUNTER — Telehealth: Payer: Self-pay

## 2014-09-16 ENCOUNTER — Other Ambulatory Visit: Payer: Self-pay

## 2014-09-16 ENCOUNTER — Encounter: Payer: Self-pay | Admitting: Family Medicine

## 2014-09-16 DIAGNOSIS — R0602 Shortness of breath: Secondary | ICD-10-CM

## 2014-09-16 MED ORDER — ALLOPURINOL 300 MG PO TABS: 300.0000 mg | ORAL_TABLET | Freq: Every day | ORAL | Status: DC

## 2014-09-16 MED ORDER — LISINOPRIL-HYDROCHLOROTHIAZIDE 20-25 MG PO TABS: 1.0000 | ORAL_TABLET | Freq: Every day | ORAL | Status: DC

## 2014-09-16 MED ORDER — ATENOLOL 100 MG PO TABS
100.0000 mg | ORAL_TABLET | Freq: Two times a day (BID) | ORAL | Status: DC
Start: 2014-09-16 — End: 2014-12-30

## 2014-09-16 NOTE — Telephone Encounter (Signed)
Refill for 3 months.  I would like for him to try to think about trying to reduce to TID gradually.

## 2014-09-16 NOTE — Telephone Encounter (Signed)
Lorazepam  Last visit 08/05/14 Last refill 06/17/14 #120 2 refills  [facility] [address]

## 2014-09-17 MED ORDER — LORAZEPAM 0.5 MG PO TABS: ORAL_TABLET | ORAL | Status: DC

## 2014-09-17 NOTE — Telephone Encounter (Signed)
RX called into pharmacy

## 2014-09-18 ENCOUNTER — Encounter: Payer: Self-pay | Admitting: Family Medicine

## 2014-09-20 ENCOUNTER — Encounter: Payer: Self-pay | Admitting: Family Medicine

## 2014-09-20 ENCOUNTER — Telehealth: Payer: Self-pay | Admitting: Family Medicine

## 2014-09-20 MED ORDER — LEVOFLOXACIN 500 MG PO TABS: 500.0000 mg | ORAL_TABLET | Freq: Every day | ORAL | Status: DC

## 2014-09-20 NOTE — Telephone Encounter (Signed)
Refill once 

## 2014-09-20 NOTE — Telephone Encounter (Signed)
Pt seen in november and diagnosed w/ acute bronchitis.  Pt states it is back, same exact symptoms, cough, phelgm, chills. No appt today, pt would like to know if dr will send in a rx levofloxacin (LEVAQUIN) 500 MG tablet  #7 helped at last episode [facility] [address]

## 2014-09-20 NOTE — Telephone Encounter (Signed)
Rx sent. Patient is aware.  

## 2014-09-23 ENCOUNTER — Encounter: Payer: Self-pay | Admitting: Family Medicine

## 2014-09-23 ENCOUNTER — Other Ambulatory Visit: Payer: Self-pay

## 2014-09-23 MED ORDER — ALBUTEROL SULFATE HFA 108 (90 BASE) MCG/ACT IN AERS: INHALATION_SPRAY | RESPIRATORY_TRACT | Status: DC

## 2014-10-01 ENCOUNTER — Other Ambulatory Visit: Payer: Self-pay

## 2014-10-01 MED ORDER — ALBUTEROL SULFATE HFA 108 (90 BASE) MCG/ACT IN AERS
1.0000 | INHALATION_SPRAY | Freq: Four times a day (QID) | RESPIRATORY_TRACT | Status: DC | PRN
Start: 2014-10-01 — End: 2018-08-08

## 2014-10-04 DIAGNOSIS — C4442 Squamous cell carcinoma of skin of scalp and neck: Secondary | ICD-10-CM

## 2014-10-04 HISTORY — DX: Squamous cell carcinoma of skin of scalp and neck: C44.42

## 2014-11-13 ENCOUNTER — Other Ambulatory Visit: Payer: Self-pay | Admitting: Family Medicine

## 2014-11-13 ENCOUNTER — Encounter: Payer: Self-pay | Admitting: Family Medicine

## 2014-11-13 DIAGNOSIS — E119 Type 2 diabetes mellitus without complications: Secondary | ICD-10-CM

## 2014-11-14 ENCOUNTER — Encounter: Payer: Self-pay | Admitting: Family Medicine

## 2014-11-14 ENCOUNTER — Other Ambulatory Visit (INDEPENDENT_AMBULATORY_CARE_PROVIDER_SITE_OTHER): Payer: 59

## 2014-11-14 DIAGNOSIS — E119 Type 2 diabetes mellitus without complications: Secondary | ICD-10-CM

## 2014-11-14 LAB — HEMOGLOBIN A1C: HEMOGLOBIN A1C: 5.9 % (ref 4.6–6.5)

## 2014-11-25 ENCOUNTER — Other Ambulatory Visit: Payer: Self-pay | Admitting: Family Medicine

## 2014-12-12 ENCOUNTER — Encounter: Payer: Self-pay | Admitting: Internal Medicine

## 2014-12-12 NOTE — Telephone Encounter (Signed)
From: Alycia Patten  To: Horald Pollen, MD  Sent: 12/11/2014 5:52 PM PST  Subject: Non-urgent Medical Advice Question    [name],  Hi,  [name] & me were just speaking of you....Marland KitchenMarland KitchenI remember you telling me of friends/relatives in the [place], [place] area.....am I nuts??? LOL!  If this makes sense to you...Marland KitchenMarland KitchenI would love to hear from you.....  Sincerely,  [name] [facility]	[place]

## 2014-12-17 ENCOUNTER — Encounter: Payer: Self-pay | Admitting: Family Medicine

## 2014-12-18 ENCOUNTER — Other Ambulatory Visit: Payer: Self-pay

## 2014-12-18 DIAGNOSIS — R0602 Shortness of breath: Secondary | ICD-10-CM

## 2014-12-18 MED ORDER — LORAZEPAM 0.5 MG PO TABS: ORAL_TABLET | ORAL | Status: DC

## 2014-12-30 ENCOUNTER — Encounter: Payer: Self-pay | Admitting: Family Medicine

## 2014-12-30 MED ORDER — ALLOPURINOL 300 MG PO TABS: 300.0000 mg | ORAL_TABLET | Freq: Every day | ORAL | Status: DC

## 2014-12-30 MED ORDER — LISINOPRIL-HYDROCHLOROTHIAZIDE 20-25 MG PO TABS: 1.0000 | ORAL_TABLET | Freq: Every day | ORAL | Status: DC

## 2014-12-30 MED ORDER — FENOFIBRIC ACID 135 MG PO CPDR: DELAYED_RELEASE_CAPSULE | ORAL | Status: DC

## 2014-12-30 MED ORDER — PANTOPRAZOLE SODIUM 40 MG PO TBEC: 40.0000 mg | DELAYED_RELEASE_TABLET | Freq: Two times a day (BID) | ORAL | Status: DC

## 2014-12-30 MED ORDER — ATENOLOL 100 MG PO TABS: 100.0000 mg | ORAL_TABLET | Freq: Two times a day (BID) | ORAL | Status: DC

## 2014-12-30 MED ORDER — METFORMIN HCL 1000 MG PO TABS: 1000.0000 mg | ORAL_TABLET | Freq: Two times a day (BID) | ORAL | Status: DC

## 2014-12-30 MED ORDER — EZETIMIBE-SIMVASTATIN 10-80 MG PO TABS: 1.0000 | ORAL_TABLET | Freq: Every day | ORAL | Status: DC

## 2015-01-02 MED ORDER — EZETIMIBE-SIMVASTATIN 10-80 MG PO TABS: 1.0000 | ORAL_TABLET | Freq: Every day | ORAL | Status: DC

## 2015-01-02 MED ORDER — METFORMIN HCL 1000 MG PO TABS: 1000.0000 mg | ORAL_TABLET | Freq: Two times a day (BID) | ORAL | Status: DC

## 2015-01-02 MED ORDER — PANTOPRAZOLE SODIUM 40 MG PO TBEC: 40.0000 mg | DELAYED_RELEASE_TABLET | Freq: Two times a day (BID) | ORAL | Status: DC

## 2015-01-02 MED ORDER — ALLOPURINOL 300 MG PO TABS: 300.0000 mg | ORAL_TABLET | Freq: Every day | ORAL | Status: DC

## 2015-01-02 MED ORDER — LISINOPRIL-HYDROCHLOROTHIAZIDE 20-25 MG PO TABS: 1.0000 | ORAL_TABLET | Freq: Every day | ORAL | Status: DC

## 2015-01-02 MED ORDER — FENOFIBRIC ACID 135 MG PO CPDR: DELAYED_RELEASE_CAPSULE | ORAL | Status: DC

## 2015-01-02 MED ORDER — ATENOLOL 100 MG PO TABS: 100.0000 mg | ORAL_TABLET | Freq: Two times a day (BID) | ORAL | Status: DC

## 2015-01-02 NOTE — Addendum Note (Signed)
Addended by: Townsend Roger D on: 01/02/2015 02:12 PM   Modules accepted: Orders

## 2015-01-03 ENCOUNTER — Telehealth: Payer: Self-pay | Admitting: Family Medicine

## 2015-01-03 NOTE — Telephone Encounter (Signed)
PA for fenofibric was denied.  Patient must try and fail fenofibrate 54 mg or 160 mg.

## 2015-01-05 NOTE — Telephone Encounter (Signed)
Fenofibrate 160 mg once daily.

## 2015-01-06 MED ORDER — FENOFIBRATE 160 MG PO TABS: 160.0000 mg | ORAL_TABLET | Freq: Every day | ORAL | Status: DC

## 2015-01-06 NOTE — Telephone Encounter (Signed)
Rx sent to pharmacy   

## 2015-01-17 ENCOUNTER — Encounter: Payer: Self-pay | Admitting: Family Medicine

## 2015-01-26 ENCOUNTER — Encounter: Payer: Self-pay | Admitting: Family Medicine

## 2015-01-27 ENCOUNTER — Other Ambulatory Visit: Payer: Self-pay

## 2015-01-27 MED ORDER — CANAGLIFLOZIN 300 MG PO TABS: 300.0000 mg | ORAL_TABLET | Freq: Every day | ORAL | Status: DC

## 2015-02-11 ENCOUNTER — Encounter: Payer: Self-pay | Admitting: Family Medicine

## 2015-02-11 DIAGNOSIS — L570 Actinic keratosis: Secondary | ICD-10-CM

## 2015-02-17 ENCOUNTER — Encounter: Payer: Self-pay | Admitting: Family Medicine

## 2015-02-19 ENCOUNTER — Encounter: Payer: Self-pay | Admitting: Family Medicine

## 2015-02-20 ENCOUNTER — Other Ambulatory Visit: Payer: Self-pay

## 2015-02-20 MED ORDER — GLUCOSE BLOOD VI STRP: ORAL_STRIP | Status: DC

## 2015-03-20 ENCOUNTER — Encounter: Payer: Self-pay | Admitting: Family Medicine

## 2015-03-20 ENCOUNTER — Other Ambulatory Visit: Payer: Self-pay | Admitting: Family Medicine

## 2015-03-21 ENCOUNTER — Other Ambulatory Visit: Payer: Self-pay | Admitting: Family Medicine

## 2015-03-21 ENCOUNTER — Other Ambulatory Visit: Payer: Self-pay

## 2015-03-21 MED ORDER — FENOFIBRATE 160 MG PO TABS: 160.0000 mg | ORAL_TABLET | Freq: Every day | ORAL | Status: DC

## 2015-03-21 MED ORDER — INSULIN DETEMIR 100 UNIT/ML FLEXPEN: PEN_INJECTOR | SUBCUTANEOUS | Status: DC

## 2015-03-31 ENCOUNTER — Encounter: Payer: Self-pay | Admitting: Family Medicine

## 2015-04-01 ENCOUNTER — Telehealth: Payer: Self-pay

## 2015-04-01 ENCOUNTER — Encounter: Payer: Self-pay | Admitting: Family Medicine

## 2015-04-01 NOTE — Telephone Encounter (Signed)
Pt sent a mychart message asking if he could get a physical 2 days early from last year physical. Informed patient that I think that he should check with insurance first because insurance companies have patients to wait a whole year before having a physical exam. I also asked office manager Derinda Late just to make sure she agreed and said patient should check with his insurance to make sure. Informed patient and patient sent a message back saying 2 days early and that he did not want to call his insurance. I sent a message back to patient informing patient that if he wants to come in 2 days earlier for a physical exam that would be fine.

## 2015-04-08 ENCOUNTER — Encounter: Payer: Self-pay | Admitting: Family Medicine

## 2015-05-08 ENCOUNTER — Other Ambulatory Visit: Payer: 59

## 2015-05-13 ENCOUNTER — Other Ambulatory Visit: Payer: Self-pay | Admitting: Family Medicine

## 2015-05-15 ENCOUNTER — Encounter: Payer: 59 | Admitting: Family Medicine

## 2015-05-30 ENCOUNTER — Other Ambulatory Visit: Payer: Self-pay | Admitting: Family Medicine

## 2015-06-10 ENCOUNTER — Other Ambulatory Visit: Payer: Self-pay

## 2015-06-10 ENCOUNTER — Encounter: Payer: Self-pay | Admitting: Family Medicine

## 2015-06-10 MED ORDER — INSULIN DETEMIR 100 UNIT/ML FLEXPEN: PEN_INJECTOR | SUBCUTANEOUS | Status: DC

## 2015-06-12 ENCOUNTER — Other Ambulatory Visit (INDEPENDENT_AMBULATORY_CARE_PROVIDER_SITE_OTHER): Payer: Medicare Other

## 2015-06-12 DIAGNOSIS — Z125 Encounter for screening for malignant neoplasm of prostate: Secondary | ICD-10-CM

## 2015-06-12 DIAGNOSIS — R7989 Other specified abnormal findings of blood chemistry: Secondary | ICD-10-CM | POA: Diagnosis not present

## 2015-06-12 DIAGNOSIS — I10 Essential (primary) hypertension: Secondary | ICD-10-CM

## 2015-06-12 DIAGNOSIS — Z0000 Encounter for general adult medical examination without abnormal findings: Secondary | ICD-10-CM | POA: Diagnosis not present

## 2015-06-12 DIAGNOSIS — E119 Type 2 diabetes mellitus without complications: Secondary | ICD-10-CM | POA: Diagnosis not present

## 2015-06-12 DIAGNOSIS — E785 Hyperlipidemia, unspecified: Secondary | ICD-10-CM

## 2015-06-12 LAB — CBC WITH DIFFERENTIAL/PLATELET
Basophils Absolute: 0 10*3/uL (ref 0.0–0.1)
Basophils Relative: 0.5 % (ref 0.0–3.0)
Eosinophils Absolute: 0.2 10*3/uL (ref 0.0–0.7)
Eosinophils Relative: 3.2 % (ref 0.0–5.0)
HCT: 46.5 % (ref 39.0–52.0)
Hemoglobin: 15.8 g/dL (ref 13.0–17.0)
Lymphocytes Relative: 22.6 % (ref 12.0–46.0)
Lymphs Abs: 1.1 10*3/uL (ref 0.7–4.0)
MCHC: 34 g/dL (ref 30.0–36.0)
MCV: 100.1 fl — ABNORMAL HIGH (ref 78.0–100.0)
Monocytes Absolute: 1 10*3/uL (ref 0.1–1.0)
Monocytes Relative: 19.2 % — ABNORMAL HIGH (ref 3.0–12.0)
Neutro Abs: 2.8 10*3/uL (ref 1.4–7.7)
Neutrophils Relative %: 54.5 % (ref 43.0–77.0)
Platelets: 172 10*3/uL (ref 150.0–400.0)
RBC: 4.65 Mil/uL (ref 4.22–5.81)
RDW: 13.5 % (ref 11.5–15.5)
WBC: 5.1 10*3/uL (ref 4.0–10.5)

## 2015-06-12 LAB — LIPID PANEL
CHOL/HDL RATIO: 4
CHOLESTEROL: 160 mg/dL (ref 0–200)
HDL: 41.4 mg/dL (ref >39.00)
NonHDL: 118.99
TRIGLYCERIDES: 213 mg/dL — AB (ref 0.0–149.0)
VLDL: 42.6 mg/dL — ABNORMAL HIGH (ref 0.0–40.0)

## 2015-06-12 LAB — HEPATIC FUNCTION PANEL
ALT: 43 U/L (ref 0–53)
AST: 39 U/L — ABNORMAL HIGH (ref 0–37)
Albumin: 4.2 g/dL (ref 3.5–5.2)
Alkaline Phosphatase: 25 U/L — ABNORMAL LOW (ref 39–117)
Bilirubin, Direct: 0.2 mg/dL (ref 0.0–0.3)
Total Bilirubin: 0.9 mg/dL (ref 0.2–1.2)
Total Protein: 6.7 g/dL (ref 6.0–8.3)

## 2015-06-12 LAB — MICROALBUMIN / CREATININE URINE RATIO
CREATININE, U: 93.5 mg/dL
MICROALB/CREAT RATIO: 0.7 mg/g (ref 0.0–30.0)
Microalb, Ur: 0.7 mg/dL (ref 0.0–1.9)

## 2015-06-12 LAB — BASIC METABOLIC PANEL
BUN: 17 mg/dL (ref 6–23)
CHLORIDE: 97 meq/L (ref 96–112)
CO2: 27 meq/L (ref 19–32)
Calcium: 9.6 mg/dL (ref 8.4–10.5)
Creatinine, Ser: 0.94 mg/dL (ref 0.40–1.50)
GFR: 85.61 mL/min (ref >60.00)
GLUCOSE: 103 mg/dL — AB (ref 70–99)
Potassium: 4.1 mEq/L (ref 3.5–5.1)
SODIUM: 137 meq/L (ref 135–145)

## 2015-06-12 LAB — TSH: TSH: 2.07 u[IU]/mL (ref 0.35–4.50)

## 2015-06-12 LAB — PSA: PSA: 0.24 ng/mL (ref 0.10–4.00)

## 2015-06-12 LAB — LDL CHOLESTEROL, DIRECT: Direct LDL: 95 mg/dL

## 2015-06-12 LAB — HEMOGLOBIN A1C: Hgb A1c MFr Bld: 5.7 % (ref 4.6–6.5)

## 2015-06-13 ENCOUNTER — Encounter: Payer: Self-pay | Admitting: Family Medicine

## 2015-06-17 ENCOUNTER — Encounter: Payer: Self-pay | Admitting: Family Medicine

## 2015-06-17 ENCOUNTER — Ambulatory Visit (INDEPENDENT_AMBULATORY_CARE_PROVIDER_SITE_OTHER): Payer: Medicare Other | Admitting: Family Medicine

## 2015-06-17 VITALS — BP 130/78 | HR 83 | Temp 97.5°F | Ht 70.0 in | Wt 250.0 lb

## 2015-06-17 DIAGNOSIS — Z23 Encounter for immunization: Secondary | ICD-10-CM | POA: Diagnosis not present

## 2015-06-17 DIAGNOSIS — G2581 Restless legs syndrome: Secondary | ICD-10-CM

## 2015-06-17 DIAGNOSIS — Z0000 Encounter for general adult medical examination without abnormal findings: Secondary | ICD-10-CM

## 2015-06-17 DIAGNOSIS — Z0001 Encounter for general adult medical examination with abnormal findings: Secondary | ICD-10-CM

## 2015-06-17 MED ORDER — MONTELUKAST SODIUM 10 MG PO TABS: 10.0000 mg | ORAL_TABLET | Freq: Every day | ORAL | Status: DC

## 2015-06-17 MED ORDER — FLUTICASONE-SALMETEROL 250-50 MCG/DOSE IN AEPB: 1.0000 | INHALATION_SPRAY | Freq: Two times a day (BID) | RESPIRATORY_TRACT | Status: DC

## 2015-06-17 MED ORDER — PRAMIPEXOLE DIHYDROCHLORIDE 0.25 MG PO TABS: 0.2500 mg | ORAL_TABLET | Freq: Every day | ORAL | Status: DC

## 2015-06-17 NOTE — Progress Notes (Signed)
Pre visit review using our clinic review tool, if applicable. No additional management support is needed unless otherwise documented below in the visit note. 

## 2015-06-17 NOTE — Progress Notes (Signed)
Subjective:    Patient ID: Adam [name], male    DOB: September 02, 1951, 64 y.o.   MRN: [id number]  HPI Patient seen for complete physical. He has several chronic medical problems including history of CAD, COPD, type 2 diabetes, restless leg syndrome, osteoarthritis, gout, history of atrial fibrillation, GERD, history of alcohol dependency, dyslipidemia, hypertension. Last colonoscopy 3 years ago. His mother died of colon cancer, patient's and patient reports that he has had adenomatous polyps multiple times in the past. He is in the process of trying to get repeat colonoscopy scheduled. Needs flu vaccine. Other immunizations up-to-date.  Exercises 5 days per week. No recent chest pains. Ongoing problems with restless leg syndrome. No caffeine use at night. Currently takes Mirapex 0.125 mg daily at bedtime. He is occasionally double this up to 2 tablets at night which has helped. With the lower dosage he is frequently not sleeping well secondary to restless leg symptoms He has done excellent job controlling blood sugars.  Past Medical History  Diagnosis Date  . Hypertension   . Coronary artery disease     x 6  . COPD (chronic obstructive pulmonary disease)   . Asthma   . Arthritis   . Depression   . GERD (gastroesophageal reflux disease)   . Allergy   . Hyperlipidemia   . Family history of polyps in the colon   . Anxiety     history of PTSD following CABG  . Gout   . Prediabetes   . H/O atrial fibrillation without current medication     following CABG with no documented episodes since then.  . RLS (restless legs syndrome)    Past Surgical History  Procedure Laterality Date  . Coronary angioplasty with stent placement    . Coronary artery bypass graft    . Appendectomy    . Total hip arthroplasty      reports that he quit smoking about 37 years ago. His smoking use included Cigarettes. He has a 20 pack-year smoking history. He has never used smokeless tobacco. He reports that he  drinks alcohol. He reports that he does not use illicit drugs. family history includes Cancer in his mother; Heart disease in his paternal grandfather and paternal grandmother. Allergies  Allergen Reactions  . Clindamycin/Lincomycin   . Keflex [Cephalexin]     rash      Review of Systems  Constitutional: Negative for fever, activity change, appetite change, fatigue and unexpected weight change.  HENT: Negative for congestion, ear pain and trouble swallowing.   Eyes: Negative for pain and visual disturbance.  Respiratory: Negative for cough, shortness of breath and wheezing.   Cardiovascular: Negative for chest pain and palpitations.  Gastrointestinal: Negative for nausea, vomiting, abdominal pain, diarrhea, constipation, blood in stool, abdominal distention and rectal pain.  Endocrine: Negative for polydipsia and polyuria.  Genitourinary: Negative for dysuria, hematuria and testicular pain.  Musculoskeletal: Positive for arthralgias. Negative for joint swelling.  Skin: Negative for rash.  Neurological: Negative for dizziness, syncope and headaches.  Hematological: Negative for adenopathy.  Psychiatric/Behavioral: Positive for sleep disturbance. Negative for confusion and dysphoric mood.       Objective:   Physical Exam  Constitutional: He is oriented to person, place, and time. He appears well-developed and well-nourished. No distress.  HENT:  Head: Normocephalic and atraumatic.  Right Ear: External ear normal.  Left Ear: External ear normal.  Mouth/Throat: Oropharynx is clear and moist.  Eyes: Conjunctivae and EOM are normal. Pupils are equal, round, and reactive to  light.  Neck: Normal range of motion. Neck supple. No thyromegaly present.  Cardiovascular: Normal rate, regular rhythm and normal heart sounds.   No murmur heard. Pulmonary/Chest: No respiratory distress. He has no wheezes. He has no rales.  Abdominal: Soft. Bowel sounds are normal. He exhibits no distension and  no mass. There is no tenderness. There is no rebound and no guarding.  Musculoskeletal: He exhibits no edema.  Lymphadenopathy:    He has no cervical adenopathy.  Neurological: He is alert and oriented to person, place, and time. He displays normal reflexes. No cranial nerve deficit.  Skin: No rash noted.  Psychiatric: He has a normal mood and affect.          Assessment & Plan:  Complete physical. Patient in process of setting up repeat colonoscopy. Flu vaccine given. Continue exercise habits and weight loss. Refill Advair and Singulair  Restless leg syndrome. Poorly controlled. Increase Mirapex to 0.25 mg once at night. Reminder to avoid caffeine use at night  Type 2 diabetes. Excellent control with A1c 5.7%. Continue current medications

## 2015-06-19 ENCOUNTER — Encounter: Payer: Self-pay | Admitting: Family Medicine

## 2015-06-19 ENCOUNTER — Other Ambulatory Visit: Payer: Self-pay

## 2015-06-19 MED ORDER — LORAZEPAM 0.5 MG PO TABS: ORAL_TABLET | ORAL | Status: DC

## 2015-07-11 ENCOUNTER — Encounter: Payer: Self-pay | Admitting: Family Medicine

## 2015-07-13 ENCOUNTER — Encounter: Payer: Self-pay | Admitting: Internal Medicine

## 2015-07-13 DIAGNOSIS — Z800 Family history of malignant neoplasm of digestive organs: Secondary | ICD-10-CM | POA: Insufficient documentation

## 2015-07-13 DIAGNOSIS — K529 Noninfective gastroenteritis and colitis, unspecified: Secondary | ICD-10-CM

## 2015-07-13 HISTORY — DX: Noninfective gastroenteritis and colitis, unspecified: K52.9

## 2015-07-14 ENCOUNTER — Encounter: Payer: Self-pay | Admitting: Family Medicine

## 2015-07-14 ENCOUNTER — Other Ambulatory Visit: Payer: Self-pay | Admitting: Family Medicine

## 2015-07-14 ENCOUNTER — Encounter: Payer: Self-pay | Admitting: Internal Medicine

## 2015-07-14 MED ORDER — INSULIN GLARGINE 300 UNIT/ML ~~LOC~~ SOPN: 80.0000 [IU] | PEN_INJECTOR | SUBCUTANEOUS | Status: DC

## 2015-07-14 NOTE — Telephone Encounter (Signed)
Please advise 

## 2015-07-22 ENCOUNTER — Ambulatory Visit (INDEPENDENT_AMBULATORY_CARE_PROVIDER_SITE_OTHER): Payer: Medicare Other | Admitting: Emergency Medicine

## 2015-07-22 ENCOUNTER — Other Ambulatory Visit: Payer: Self-pay | Admitting: Emergency Medicine

## 2015-07-22 ENCOUNTER — Encounter: Payer: Self-pay | Admitting: Emergency Medicine

## 2015-07-22 VITALS — BP 128/78 | HR 79

## 2015-07-22 DIAGNOSIS — G2581 Restless legs syndrome: Secondary | ICD-10-CM | POA: Diagnosis not present

## 2015-07-22 DIAGNOSIS — J449 Chronic obstructive pulmonary disease, unspecified: Secondary | ICD-10-CM

## 2015-07-22 MED ORDER — TIOTROPIUM BROMIDE MONOHYDRATE 18 MCG IN CAPS: 18.0000 ug | ORAL_CAPSULE | Freq: Every day | RESPIRATORY_TRACT | Status: DC

## 2015-07-22 NOTE — Assessment & Plan Note (Signed)
COPD likely mild based on the available spirometry from [facility] in 2013. Predicted values are not available for his spirometry but his FEV1 was 3.3 L. Has been managed on Advair but continues to have exertional dyspnea. His also had 2 episodes of bronchitis versus acute exacerbations in the fall of 2015. We will try changing his Advair to Spiriva to see if he benefits. We will also repeat his spirometry to compare with 2013. He will try to get [place] the predicted values from his [place] chart. Follow-up in one month. Flu shot up-to-date

## 2015-07-22 NOTE — Assessment & Plan Note (Signed)
On pramipexole, recently increased with an improvement in his symptoms

## 2015-07-22 NOTE — Progress Notes (Signed)
Subjective:    Patient ID: Adam [name], male    DOB: 12-31-1950, 64 y.o.   MRN: [id number]  HPI 64 year old gentleman, former smoker (20 pack years) with a history of hypertension, coronary artery disease/CABG, restless leg syndrome, followed by Dr. Gwenette Greet for COPD.  He has been treated for acute bronchitis x 2 since his last visit with Dr Gwenette Greet in [place] '15. He experiences DOE with walking hills. He is able to ride stationary bike. He has daily cough, prod of clear, mostly in the am. Has increased his albuterol use. He is on flonase. He reports that his pramipexole was just increased due to breakthrough symptoms and his rest leg syndrome with improvement.   Spirometry from [facility] 2013 reviewed by me today > FVC 4.79L, FEV1 3.30L, ratio 69%. Predicted values not available at this time   Review of Systems  Past Medical History  Diagnosis Date  . Hypertension   . Coronary artery disease     x 6  . COPD (chronic obstructive pulmonary disease) ([place])   . Asthma   . Arthritis   . Depression   . GERD (gastroesophageal reflux disease)   . Allergy   . Hyperlipidemia   . Family history of polyps in the colon   . Anxiety     history of PTSD following CABG  . Gout   . Prediabetes   . H/O atrial fibrillation without current medication     following CABG with no documented episodes since then.  . RLS (restless legs syndrome)   . Colitis- colonoscopy 2014 07/13/2015  . Hx of adenomatous colonic polyps 08/12/2010     Family History  Problem Relation Age of Onset  . Colon cancer Mother   . Heart disease Paternal Grandmother   . Heart disease Paternal Grandfather      Social History   Social History  . Marital Status: Unknown    Spouse Name: N/A  . Number of Children: N/A  . Years of Education: N/A   Occupational History  . retired    Social History Main Topics  . Smoking status: Former Smoker -- 2.00 packs/day for 10 years    Types: Cigarettes    Quit date:  10/04/1977  . Smokeless tobacco: Never Used     Comment: 2PPD at heaviest  . Alcohol Use: Yes     Comment: 1.5 bottles of wine weekly  . Drug Use: No     Comment: Smoked Marijuana back in 1970s  . Sexual Activity: Yes   Other Topics Concern  . Not on file   Social History Narrative     Allergies  Allergen Reactions  . Clindamycin/Lincomycin   . Keflex [Cephalexin]     rash     Outpatient Prescriptions Prior to Visit  Medication Sig Dispense Refill  . albuterol (PROAIR HFA) 108 (90 BASE) MCG/ACT inhaler Inhale 1-2 puffs into the lungs every 6 (six) hours as needed for wheezing or shortness of breath. 8 g 5  . allopurinol (ZYLOPRIM) 300 MG tablet Take 1 tablet (300 mg total) by mouth daily. 90 tablet 1  . aspirin 81 MG tablet Take 81 mg by mouth daily.    Marland Kitchen atenolol (TENORMIN) 100 MG tablet Take 1 tablet (100 mg total) by mouth 2 (two) times daily. 180 tablet 1  . canagliflozin (INVOKANA) 300 MG TABS tablet Take 300 mg by mouth daily. 90 tablet 2  . Coenzyme Q10 (CO Q-10) 100 MG CAPS Take 1 tablet by mouth daily.     Marland Kitchen  ezetimibe-simvastatin (VYTORIN) 10-80 MG per tablet Take 1 tablet by mouth at bedtime. 90 tablet 1  . fenofibrate 160 MG tablet TAKE 1 TABLET BY MOUTH DAILY 30 tablet 5  . fluticasone (FLONASE) 50 MCG/ACT nasal spray Place 1 spray into the nose daily.     . Fluticasone-Salmeterol (ADVAIR) 250-50 MCG/DOSE AEPB Inhale 1 puff into the lungs 2 (two) times daily. 180 each 3  . glucose blood (ONETOUCH VERIO) test strip USE AS DIRECTED Check 1 time daily. E11.9 100 each 3  . Insulin Glargine (TOUJEO SOLOSTAR) 300 UNIT/ML SOPN Inject 80 Units into the skin as directed. 1 pen 1  . Insulin Pen Needle (BD PEN NEEDLE NANO U/F) 32G X 4 MM MISC Use as instructed 100 each 3  . lisinopril-hydrochlorothiazide (PRINZIDE,ZESTORETIC) 20-25 MG per tablet Take 1 tablet by mouth daily. 90 tablet 1  . LORazepam (ATIVAN) 0.5 MG tablet TAKE 1 TABLET BY MOUTH EVERY 4 HOURS AND TAKE 2 TABLETS  BY MOUTH AT BEDTIME 120 tablet 2  . metFORMIN (GLUCOPHAGE) 1000 MG tablet Take 1 tablet (1,000 mg total) by mouth 2 (two) times daily with a meal. 180 tablet 1  . montelukast (SINGULAIR) 10 MG tablet Take 1 tablet (10 mg total) by mouth at bedtime. 90 tablet 3  . Olopatadine HCl (PATADAY) 0.2 % SOLN Apply 1 drop to eye as needed. 2.5 mL 5  . ONETOUCH DELICA LANCETS FINE MISC 1 Device by Other route as needed. Use as instructed.  DX: 250.00 100 each 3  . pantoprazole (PROTONIX) 40 MG tablet Take 1 tablet (40 mg total) by mouth 2 (two) times daily. 180 tablet 1  . pramipexole (MIRAPEX) 0.25 MG tablet Take 1 tablet (0.25 mg total) by mouth at bedtime. 90 tablet 3  . Insulin Detemir (LEVEMIR FLEXTOUCH) 100 UNIT/ML Pen INJECT 80 UNITS AS DIRECTED 15 mL 3  . Ascorbic Acid (VITAMIN C) 100 MG tablet Take 400 mg by mouth daily.    . Cholecalciferol (VITAMIN D-3) 5000 UNITS TABS Take 1 tablet by mouth daily.    . Magnesium Oxide (MAG-200 PO) Take 1 tablet by mouth daily.     . Multiple Vitamins-Minerals (CENTRUM SILVER ADULT 50+) TABS Take 1 tablet by mouth daily.    . vitamin B-12 (CYANOCOBALAMIN) 1000 MCG tablet Take 1,000 mcg by mouth daily.     No facility-administered medications prior to visit.       Objective:   Physical Exam Filed Vitals:   07/22/15 1008  BP: 128/78  Pulse: 79  SpO2: 97%   Gen: Pleasant, overwt man, in no distress,  normal affect  ENT: No lesions,  mouth clear,  oropharynx clear, no postnasal drip  Neck: No JVD, no TMG, no carotid bruits  Lungs: No use of accessory muscles, clear without rales or rhonchi  Cardiovascular: RRR, heart sounds normal, no murmur or gallops, no peripheral edema  Musculoskeletal: No deformities, no cyanosis or clubbing  Neuro: alert, non focal  Skin: Warm, no lesions or rashes      Assessment & Plan:  COPD (chronic obstructive pulmonary disease) COPD likely mild based on the available spirometry from [facility] in 2013. Predicted  values are not available for his spirometry but his FEV1 was 3.3 L. Has been managed on Advair but continues to have exertional dyspnea. His also had 2 episodes of bronchitis versus acute exacerbations in the fall of 2015. We will try changing his Advair to Spiriva to see if he benefits. We will also repeat his spirometry to  compare with 2013. He will try to get [place] the predicted values from his [place] chart. Follow-up in one month. Flu shot up-to-date  RLS (restless legs syndrome) On pramipexole, recently increased with an improvement in his symptoms

## 2015-07-22 NOTE — Patient Instructions (Addendum)
Please stop Advair for now.  Start Spiriva one inhalation daily.  Keep albuterol available to use 2 puffs as needed.  We will perform full Pulmonary function testing to compare with 2013.  Flu shot up to date.  Follow with Dr Lamonte Sakai in 1 month

## 2015-07-23 ENCOUNTER — Encounter: Payer: Self-pay | Admitting: Emergency Medicine

## 2015-07-28 ENCOUNTER — Other Ambulatory Visit: Payer: Self-pay | Admitting: Family Medicine

## 2015-07-28 ENCOUNTER — Encounter: Payer: Self-pay | Admitting: Family Medicine

## 2015-07-29 ENCOUNTER — Other Ambulatory Visit: Payer: Self-pay | Admitting: Family Medicine

## 2015-07-29 MED ORDER — ONETOUCH DELICA LANCETS FINE MISC: 1.0000 | Status: DC | PRN

## 2015-07-29 MED ORDER — GLUCOSE BLOOD VI STRP: ORAL_STRIP | Status: DC

## 2015-07-30 ENCOUNTER — Other Ambulatory Visit: Payer: Self-pay | Admitting: Family Medicine

## 2015-07-31 ENCOUNTER — Encounter: Payer: Self-pay | Admitting: Family Medicine

## 2015-08-01 ENCOUNTER — Ambulatory Visit (INDEPENDENT_AMBULATORY_CARE_PROVIDER_SITE_OTHER): Payer: Medicare Other | Admitting: Family Medicine

## 2015-08-01 ENCOUNTER — Telehealth: Payer: Self-pay | Admitting: Family Medicine

## 2015-08-01 ENCOUNTER — Encounter: Payer: Self-pay | Admitting: Family Medicine

## 2015-08-01 VITALS — BP 120/80 | HR 97 | Temp 98.3°F | Wt 252.6 lb

## 2015-08-01 DIAGNOSIS — J209 Acute bronchitis, unspecified: Secondary | ICD-10-CM

## 2015-08-01 MED ORDER — AMOXICILLIN-POT CLAVULANATE 875-125 MG PO TABS: 1.0000 | ORAL_TABLET | Freq: Two times a day (BID) | ORAL | Status: AC

## 2015-08-01 MED ORDER — PREDNISONE 10 MG PO TABS: ORAL_TABLET | ORAL | Status: DC

## 2015-08-01 NOTE — Telephone Encounter (Signed)
Pt had an appt today

## 2015-08-01 NOTE — Telephone Encounter (Addendum)
Pt has emailed md last night. Pt has bronchitis and would like levofloxacin 500 mg send to [facility] on [address]. Pt wife has an appt today at 4pm. Pt does not want an appt. Pt stated md has sent abx last time into pharm. Pt has copd

## 2015-08-01 NOTE — Progress Notes (Signed)
Pre visit review using our clinic review tool, if applicable. No additional management support is needed unless otherwise documented below in the visit note. 

## 2015-08-01 NOTE — Patient Instructions (Signed)

## 2015-08-01 NOTE — Progress Notes (Signed)
   Subjective:    Patient ID: Adam [name], male    DOB: 1950-11-24, 64 y.o.   MRN: [id number]  HPI   Patient seen with cough which just started couple days ago. This morning he woke up with some "sweats ". No temperature taken. Not aware of any definite fever. He has history of known COPD and is on Spiriva. When necessary albuterol. He had some wheezing last night. Took over-the-counter cough medication with codeine which he got in [place] which helped cough slightly. He feels he is wheezing somewhat less today. Cough is mostly dry and occasionally productive. Past history of Allergy to Keflex and clindamycin  Past Medical History  Diagnosis Date  . Hypertension   . Coronary artery disease     x 6  . COPD (chronic obstructive pulmonary disease) ([place])   . Asthma   . Arthritis   . Depression   . GERD (gastroesophageal reflux disease)   . Allergy   . Hyperlipidemia   . Family history of polyps in the colon   . Anxiety     history of PTSD following CABG  . Gout   . Prediabetes   . H/O atrial fibrillation without current medication     following CABG with no documented episodes since then.  . RLS (restless legs syndrome)   . Colitis- colonoscopy 2014 07/13/2015  . Hx of adenomatous colonic polyps 08/12/2010   Past Surgical History  Procedure Laterality Date  . Coronary angioplasty with stent placement    . Coronary artery bypass graft    . Appendectomy    . Total hip arthroplasty      reports that he quit smoking about 37 years ago. His smoking use included Cigarettes. He has a 20 pack-year smoking history. He has never used smokeless tobacco. He reports that he drinks alcohol. He reports that he does not use illicit drugs. family history includes Colon cancer in his mother; Heart disease in his paternal grandfather and paternal grandmother. Allergies  Allergen Reactions  . Clindamycin/Lincomycin   . Keflex [Cephalexin]     rash      Review of Systems  Constitutional:  Positive for chills. Negative for fever.  Respiratory: Positive for cough, shortness of breath and wheezing.   Cardiovascular: Negative for chest pain.       Objective:   Physical Exam  Constitutional: He appears well-developed and well-nourished. No distress.  Cardiovascular: Normal rate and regular rhythm.   Pulmonary/Chest: Effort normal. No respiratory distress. He has wheezes. He has no rales.  Musculoskeletal: He exhibits no edema.          Assessment & Plan:  Cough with associated wheezing in a patient with known COPD. No respiratory distress. Prednisone taper over the next week starting at 40 mg daily. He knows that this exacerbate his blood sugars and he will monitor this closely. Suspect this may be all viral. We have not recommended antibiotics at this point but if he has any fever or worsening symptoms he will start Augmentin (this was printed with the weekend coming up).

## 2015-08-13 ENCOUNTER — Encounter: Payer: Self-pay | Admitting: Family Medicine

## 2015-08-15 NOTE — Telephone Encounter (Signed)
Autumn, please see message from Dr. Elease Hashimoto.

## 2015-08-17 ENCOUNTER — Encounter: Payer: Self-pay | Admitting: Family Medicine

## 2015-08-21 ENCOUNTER — Encounter: Payer: Self-pay | Admitting: Family Medicine

## 2015-08-21 ENCOUNTER — Ambulatory Visit (INDEPENDENT_AMBULATORY_CARE_PROVIDER_SITE_OTHER): Payer: Medicare Other | Admitting: Family Medicine

## 2015-08-21 VITALS — BP 140/92 | HR 92 | Temp 97.4°F | Resp 16 | Ht 70.0 in | Wt 259.9 lb

## 2015-08-21 DIAGNOSIS — J441 Chronic obstructive pulmonary disease with (acute) exacerbation: Secondary | ICD-10-CM

## 2015-08-21 MED ORDER — AMOXICILLIN-POT CLAVULANATE 875-125 MG PO TABS: 1.0000 | ORAL_TABLET | Freq: Two times a day (BID) | ORAL | Status: AC

## 2015-08-21 NOTE — Progress Notes (Signed)
   Subjective:    Patient ID: Adam [name], male    DOB: May 28, 1951, 64 y.o.   MRN: [id number]  HPI Patient seen with recent COPD exacerbation. We placed him on prednisone taper and he did fill significantly improved while on the prednisone. He states he's had some recurrent wheezing over the past week. He was recently switched per pulmonary to Spiriva which he is taking consistently. Using albuterol as a rescue inhaler. He denies any fever. He has increased volume of mucus over baseline and also frequent color changes but mostly clear. Some increased dyspnea over baseline. He feels as wheezing especially bothersome at night. No recent chest pains. He did not take any antibiotics when he was here with exacerbation back in October. He has scheduled follow-up with pulmonary tomorrow.  Past Medical History  Diagnosis Date  . Hypertension   . Coronary artery disease     x 6  . COPD (chronic obstructive pulmonary disease) ([place])   . Asthma   . Arthritis   . Depression   . GERD (gastroesophageal reflux disease)   . Allergy   . Hyperlipidemia   . Family history of polyps in the colon   . Anxiety     history of PTSD following CABG  . Gout   . Prediabetes   . H/O atrial fibrillation without current medication     following CABG with no documented episodes since then.  . RLS (restless legs syndrome)   . Colitis- colonoscopy 2014 07/13/2015  . Hx of adenomatous colonic polyps 08/12/2010   Past Surgical History  Procedure Laterality Date  . Coronary angioplasty with stent placement    . Coronary artery bypass graft    . Appendectomy    . Total hip arthroplasty      reports that he quit smoking about 37 years ago. His smoking use included Cigarettes. He has a 20 pack-year smoking history. He has never used smokeless tobacco. He reports that he drinks alcohol. He reports that he does not use illicit drugs. family history includes Colon cancer in his mother; Heart disease in his paternal  grandfather and paternal grandmother. Allergies  Allergen Reactions  . Clindamycin/Lincomycin   . Keflex [Cephalexin]     rash      Review of Systems  Constitutional: Negative for fever and chills.  HENT: Negative for congestion.   Respiratory: Positive for cough, shortness of breath and wheezing.   Cardiovascular: Negative for chest pain.       Objective:   Physical Exam  Constitutional: He appears well-developed and well-nourished.  HENT:  Mouth/Throat: Oropharynx is clear and moist.  Cardiovascular: Normal rate and regular rhythm.   Pulmonary/Chest: Effort normal and breath sounds normal. No respiratory distress. He has no wheezes. He has no rales.  Musculoskeletal: He exhibits no edema.          Assessment & Plan:  Acute exacerbation of COPD. Patient is in no respiratory distress. Pulse oximetry 98%. He's had now about 3-4 weeks of productive cough along with reported intermittent wheezing (though none noted currently on exam), and some increased dyspnea over baseline. No recent chest pain. Go ahead and start Augmentin. We elected not to place on any additional prednisone at this time with fairly unremarkable exam today. Continue Spiriva. Follow-up with pulmonary tomorrow

## 2015-08-21 NOTE — Progress Notes (Signed)
Pre visit review using our clinic review tool, if applicable. No additional management support is needed unless otherwise documented below in the visit note. 

## 2015-08-21 NOTE — Patient Instructions (Signed)
Chronic Obstructive Pulmonary Disease Chronic obstructive pulmonary disease (COPD) is a common lung condition in which airflow from the lungs is limited. COPD is a general term that can be used to describe many different lung problems that limit airflow, including both chronic bronchitis and emphysema. If you have COPD, your lung function will probably never return to normal, but there are measures you can take to improve lung function and make yourself feel better. CAUSES   Smoking (common).  Exposure to secondhand smoke.  Genetic problems.  Chronic inflammatory lung diseases or recurrent infections. SYMPTOMS  Shortness of breath, especially with physical activity.  Deep, persistent (chronic) cough with a large amount of thick mucus.  Wheezing.  Rapid breaths (tachypnea).  Gray or bluish discoloration (cyanosis) of the skin, especially in your fingers, toes, or lips.  Fatigue.  Weight loss.  Frequent infections or episodes when breathing symptoms become much worse (exacerbations).  Chest tightness. DIAGNOSIS Your health care provider will take a medical history and perform a physical examination to diagnose COPD. Additional tests for COPD may include:  Lung (pulmonary) function tests.  Chest X-ray.  CT scan.  Blood tests. TREATMENT  Treatment for COPD may include:  Inhaler and nebulizer medicines. These help manage the symptoms of COPD and make your breathing more comfortable.  Supplemental oxygen. Supplemental oxygen is only helpful if you have a low oxygen level in your blood.  Exercise and physical activity. These are beneficial for nearly all people with COPD.  Lung surgery or transplant.  Nutrition therapy to gain weight, if you are underweight.  Pulmonary rehabilitation. This may involve working with a team of health care providers and specialists, such as respiratory, occupational, and physical therapists. HOME CARE INSTRUCTIONS  Take all medicines  (inhaled or pills) as directed by your health care provider.  Avoid over-the-counter medicines or cough syrups that dry up your airway (such as antihistamines) and slow down the elimination of secretions unless instructed otherwise by your health care provider.  If you are a smoker, the most important thing that you can do is stop smoking. Continuing to smoke will cause further lung damage and breathing trouble. Ask your health care provider for help with quitting smoking. He or she can direct you to community resources or hospitals that provide support.  Avoid exposure to irritants such as smoke, chemicals, and fumes that aggravate your breathing.  Use oxygen therapy and pulmonary rehabilitation if directed by your health care provider. If you require home oxygen therapy, ask your health care provider whether you should purchase a pulse oximeter to measure your oxygen level at home.  Avoid contact with individuals who have a contagious illness.  Avoid extreme temperature and humidity changes.  Eat healthy foods. Eating smaller, more frequent meals and resting before meals may help you maintain your strength.  Stay active, but balance activity with periods of rest. Exercise and physical activity will help you maintain your ability to do things you want to do.  Preventing infection and hospitalization is very important when you have COPD. Make sure to receive all the vaccines your health care provider recommends, especially the pneumococcal and influenza vaccines. Ask your health care provider whether you need a pneumonia vaccine.  Learn and use relaxation techniques to manage stress.  Learn and use controlled breathing techniques as directed by your health care provider. Controlled breathing techniques include:  Pursed lip breathing. Start by breathing in (inhaling) through your nose for 1 second. Then, purse your lips as if you were   going to whistle and breathe out (exhale) through the  pursed lips for 2 seconds.  Diaphragmatic breathing. Start by putting one hand on your abdomen just above your waist. Inhale slowly through your nose. The hand on your abdomen should move out. Then purse your lips and exhale slowly. You should be able to feel the hand on your abdomen moving in as you exhale.  Learn and use controlled coughing to clear mucus from your lungs. Controlled coughing is a series of short, progressive coughs. The steps of controlled coughing are: 1. Lean your head slightly forward. 2. Breathe in deeply using diaphragmatic breathing. 3. Try to hold your breath for 3 seconds. 4. Keep your mouth slightly open while coughing twice. 5. Spit any mucus out into a tissue. 6. Rest and repeat the steps once or twice as needed. SEEK MEDICAL CARE IF:  You are coughing up more mucus than usual.  There is a change in the color or thickness of your mucus.  Your breathing is more labored than usual.  Your breathing is faster than usual. SEEK IMMEDIATE MEDICAL CARE IF:  You have shortness of breath while you are resting.  You have shortness of breath that prevents you from:  Being able to talk.  Performing your usual physical activities.  You have chest pain lasting longer than 5 minutes.  Your skin color is more cyanotic than usual.  You measure low oxygen saturations for longer than 5 minutes with a pulse oximeter. MAKE SURE YOU:  Understand these instructions.  Will watch your condition.  Will get help right away if you are not doing well or get worse.   This information is not intended to replace advice given to you by your health care provider. Make sure you discuss any questions you have with your health care provider.   Document Released: 06/30/2005 Document Revised: 10/11/2014 Document Reviewed: 05/17/2013 Elsevier Interactive Patient Education 2016 Elsevier Inc.  

## 2015-08-22 ENCOUNTER — Ambulatory Visit (INDEPENDENT_AMBULATORY_CARE_PROVIDER_SITE_OTHER): Payer: Medicare Other | Admitting: Emergency Medicine

## 2015-08-22 ENCOUNTER — Encounter: Payer: Self-pay | Admitting: Family Medicine

## 2015-08-22 ENCOUNTER — Encounter: Payer: Self-pay | Admitting: Emergency Medicine

## 2015-08-22 VITALS — BP 130/90 | HR 85 | Wt 260.0 lb

## 2015-08-22 DIAGNOSIS — J309 Allergic rhinitis, unspecified: Secondary | ICD-10-CM | POA: Diagnosis not present

## 2015-08-22 DIAGNOSIS — J449 Chronic obstructive pulmonary disease, unspecified: Secondary | ICD-10-CM

## 2015-08-22 NOTE — Progress Notes (Signed)
Subjective:    Patient ID: Adam [name], male    DOB: 1951/06/09, 64 y.o.   MRN: [id number]  HPI 64 year old gentleman, former smoker (20 pack years) with a history of hypertension, coronary artery disease/CABG, restless leg syndrome, followed by Dr. Gwenette Greet for COPD.  He has been treated for acute bronchitis x 2 since his last visit with Dr Gwenette Greet in [place] '15. He experiences DOE with walking hills. He is able to ride stationary bike. He has daily cough, prod of clear, mostly in the am. Has increased his albuterol use. He is on flonase. He reports that his pramipexole was just increased due to breakthrough symptoms and his rest leg syndrome with improvement.   Spirometry from [facility] 2013 reviewed by me today > FVC 4.79L, FEV1 3.30L, ratio 69%. Predicted values not available at this time  ROV 08/22/15 -- follow-up visit for COPD. At our last visit we stopped Advair and changed him to Spiriva. We planned on performing PFTs but this has not yet been done. He likes the change, may be having fewer nocturnal sx. Since last visit he developed increase cough, clear sputum, more head congestion. Was treated with prednisone, helped him temporarily.  Was just started on augmentin. He is currently not on flonase.    CAT Score 07/22/2015  Total CAT Score 21    Review of Systems As per hpi  Past Medical History  Diagnosis Date  . Hypertension   . Coronary artery disease     x 6  . COPD (chronic obstructive pulmonary disease) ([place])   . Asthma   . Arthritis   . Depression   . GERD (gastroesophageal reflux disease)   . Allergy   . Hyperlipidemia   . Family history of polyps in the colon   . Anxiety     history of PTSD following CABG  . Gout   . Prediabetes   . H/O atrial fibrillation without current medication     following CABG with no documented episodes since then.  . RLS (restless legs syndrome)   . Colitis- colonoscopy 2014 07/13/2015  . Hx of adenomatous colonic polyps  08/12/2010     Family History  Problem Relation Age of Onset  . Colon cancer Mother   . Heart disease Paternal Grandmother   . Heart disease Paternal Grandfather      Social History   Social History  . Marital Status: Unknown    Spouse Name: N/A  . Number of Children: N/A  . Years of Education: N/A   Occupational History  . retired    Social History Main Topics  . Smoking status: Former Smoker -- 2.00 packs/day for 10 years    Types: Cigarettes    Quit date: 10/04/1977  . Smokeless tobacco: Never Used     Comment: 2PPD at heaviest  . Alcohol Use: Yes     Comment: 1.5 bottles of wine weekly  . Drug Use: No     Comment: Smoked Marijuana back in 1970s  . Sexual Activity: Yes   Other Topics Concern  . Not on file   Social History Narrative     Allergies  Allergen Reactions  . Clindamycin/Lincomycin   . Keflex [Cephalexin]     rash     Outpatient Prescriptions Prior to Visit  Medication Sig Dispense Refill  . albuterol (PROAIR HFA) 108 (90 BASE) MCG/ACT inhaler Inhale 1-2 puffs into the lungs every 6 (six) hours as needed for wheezing or shortness of breath. 8 g 5  .  allopurinol (ZYLOPRIM) 300 MG tablet TAKE 1 TABLET BY MOUTH DAILY 30 tablet 3  . amoxicillin-clavulanate (AUGMENTIN) 875-125 MG tablet Take 1 tablet by mouth 2 (two) times daily. 20 tablet 0  . Ascorbic Acid (VITAMIN C) 100 MG tablet Take 400 mg by mouth daily.    Marland Kitchen aspirin 81 MG tablet Take 81 mg by mouth daily.    Marland Kitchen atenolol (TENORMIN) 100 MG tablet Take 1 tablet (100 mg total) by mouth 2 (two) times daily. 180 tablet 1  . Coenzyme Q10 (CO Q-10) 100 MG CAPS Take 1 tablet by mouth daily.     Marland Kitchen ezetimibe-simvastatin (VYTORIN) 10-80 MG per tablet Take 1 tablet by mouth at bedtime. 90 tablet 1  . fenofibrate 160 MG tablet TAKE 1 TABLET BY MOUTH DAILY 30 tablet 5  . fluticasone (FLONASE) 50 MCG/ACT nasal spray Place 1 spray into the nose daily.     Marland Kitchen glucose blood (ONETOUCH VERIO) test strip USE AS  DIRECTED Check 1 time daily. E11.9 100 each 3  . Insulin Glargine (TOUJEO SOLOSTAR) 300 UNIT/ML SOPN Inject 80 Units into the skin as directed. 1 pen 1  . Insulin Pen Needle (BD PEN NEEDLE NANO U/F) 32G X 4 MM MISC Use as instructed 100 each 3  . lisinopril-hydrochlorothiazide (PRINZIDE,ZESTORETIC) 20-25 MG per tablet Take 1 tablet by mouth daily. 90 tablet 1  . LORazepam (ATIVAN) 0.5 MG tablet TAKE 1 TABLET BY MOUTH EVERY 4 HOURS AND TAKE 2 TABLETS BY MOUTH AT BEDTIME 120 tablet 2  . Magnesium Oxide (MAG-200 PO) Take 1 tablet by mouth daily.     . metFORMIN (GLUCOPHAGE) 1000 MG tablet Take 1 tablet (1,000 mg total) by mouth 2 (two) times daily with a meal. 180 tablet 1  . montelukast (SINGULAIR) 10 MG tablet Take 1 tablet (10 mg total) by mouth at bedtime. 90 tablet 3  . Multiple Vitamins-Minerals (CENTRUM SILVER ADULT 50+) TABS Take 1 tablet by mouth daily.    . Olopatadine HCl (PATADAY) 0.2 % SOLN Apply 1 drop to eye as needed. 2.5 mL 5  . ONETOUCH DELICA LANCETS FINE MISC 1 Device by Other route as needed. Use as instructed.  DX: 250.00 100 each 3  . pantoprazole (PROTONIX) 40 MG tablet Take 1 tablet (40 mg total) by mouth 2 (two) times daily. 180 tablet 1  . pramipexole (MIRAPEX) 0.25 MG tablet Take 1 tablet (0.25 mg total) by mouth at bedtime. 90 tablet 3  . tiotropium (SPIRIVA) 18 MCG inhalation capsule Place 1 capsule (18 mcg total) into inhaler and inhale daily. 30 capsule 1  . canagliflozin (INVOKANA) 300 MG TABS tablet Take 300 mg by mouth daily. (Patient not taking: Reported on 08/21/2015) 90 tablet 2   No facility-administered medications prior to visit.       Objective:   Physical Exam Filed Vitals:   08/22/15 1042  BP: 130/90  Pulse: 85  Weight: 260 lb (117.935 kg)  SpO2: 98%   Gen: Pleasant, overwt man, in no distress,  normal affect  ENT: No lesions,  mouth clear,  oropharynx clear, no postnasal drip  Neck: No JVD, no TMG, no carotid bruits  Lungs: No use of  accessory muscles, clear without rales or rhonchi  Cardiovascular: RRR, heart sounds normal, no murmur or gallops, no peripheral edema  Musculoskeletal: No deformities, no cyanosis or clubbing  Neuro: alert, non focal  Skin: Warm, no lesions or rashes      Assessment & Plan:  COPD (chronic obstructive pulmonary disease) Suspect that  his recent flare related to his increaed allergy sx. We will continue spiriva, ramp up his allergy regimen. He has completed prednisone. I agree with Dr Elease Hashimoto that he likely does not have a bronchitis. We will defer the Augmentin unless he develops more consistent bronchitis sx.   Allergic rhinitis He stopped his fluticasone nasal spray 2 months ago when his symptoms improved and the pollen counts were lower. I believe he needs to go back on this and will likely need it every fall given the pattern of his symptoms both last year and this year. May also require loratadine or nasal saline washes. We will add these later if necessary

## 2015-08-22 NOTE — Patient Instructions (Signed)
Please restart your fluticasone nasal spray, 2 sprays each nostril twice a day for 1 week, then decrease to once a day  Stop Augmentin for now.  Continue spiriva once a day  Take albuterol 2 puffs up to every 4 hours if needed for shortness of breath.  We will perform your PFT's when you have improved from your acute illness.  Follow with Dr Lamonte Sakai in 4 months or sooner if you have any problems.

## 2015-08-22 NOTE — Assessment & Plan Note (Signed)
Suspect that his recent flare related to his increaed allergy sx. We will continue spiriva, ramp up his allergy regimen. He has completed prednisone. I agree with Dr Elease Hashimoto that he likely does not have a bronchitis. We will defer the Augmentin unless he develops more consistent bronchitis sx.

## 2015-08-22 NOTE — Assessment & Plan Note (Signed)
He stopped his fluticasone nasal spray 2 months ago when his symptoms improved and the pollen counts were lower. I believe he needs to go back on this and will likely need it every fall given the pattern of his symptoms both last year and this year. May also require loratadine or nasal saline washes. We will add these later if necessary

## 2015-08-26 ENCOUNTER — Encounter: Payer: Self-pay | Admitting: Family Medicine

## 2015-08-27 MED ORDER — PANTOPRAZOLE SODIUM 40 MG PO TBEC: 40.0000 mg | DELAYED_RELEASE_TABLET | Freq: Two times a day (BID) | ORAL | Status: DC

## 2015-08-27 NOTE — Telephone Encounter (Signed)
Rx was already sent

## 2015-08-29 ENCOUNTER — Encounter: Payer: Self-pay | Admitting: Family Medicine

## 2015-08-31 ENCOUNTER — Encounter: Payer: Self-pay | Admitting: Family Medicine

## 2015-09-01 ENCOUNTER — Other Ambulatory Visit: Payer: Self-pay | Admitting: Family Medicine

## 2015-09-01 MED ORDER — INSULIN GLARGINE 300 UNIT/ML ~~LOC~~ SOPN: 80.0000 [IU] | PEN_INJECTOR | SUBCUTANEOUS | Status: DC

## 2015-09-01 MED ORDER — ATENOLOL 100 MG PO TABS: 100.0000 mg | ORAL_TABLET | Freq: Two times a day (BID) | ORAL | Status: DC

## 2015-09-04 ENCOUNTER — Other Ambulatory Visit: Payer: Self-pay | Admitting: Family Medicine

## 2015-09-04 ENCOUNTER — Encounter: Payer: Self-pay | Admitting: Family Medicine

## 2015-09-04 MED ORDER — METFORMIN HCL 1000 MG PO TABS: 1000.0000 mg | ORAL_TABLET | Freq: Two times a day (BID) | ORAL | Status: DC

## 2015-09-09 LAB — HM DIABETES EYE EXAM

## 2015-09-10 ENCOUNTER — Ambulatory Visit (INDEPENDENT_AMBULATORY_CARE_PROVIDER_SITE_OTHER): Payer: Medicare Other | Admitting: Internal Medicine

## 2015-09-10 ENCOUNTER — Encounter: Payer: Self-pay | Admitting: Internal Medicine

## 2015-09-10 VITALS — BP 150/80 | HR 68 | Ht 70.0 in | Wt 260.0 lb

## 2015-09-10 DIAGNOSIS — K529 Noninfective gastroenteritis and colitis, unspecified: Secondary | ICD-10-CM

## 2015-09-10 DIAGNOSIS — Z86010 Personal history of colonic polyps: Secondary | ICD-10-CM | POA: Diagnosis not present

## 2015-09-10 NOTE — Patient Instructions (Signed)
  You have been scheduled for a colonoscopy. Please follow written instructions given to you at your visit today.  Please pick up your over the counter prep supplies at the pharmacy. If you use inhalers (even only as needed), please bring them with you on the day of your procedure. Your physician has requested that you go to www.startemmi.com and enter the access code given to you at your visit today. This web site gives a general overview about your procedure. However, you should still follow specific instructions given to you by our office regarding your preparation for the procedure.   I appreciate the opportunity to care for you. Carl Gessner, MD, FACG  

## 2015-09-10 NOTE — Assessment & Plan Note (Signed)
Colonoscopy The risks and benefits as well as alternatives of endoscopic procedure(s) have been discussed and reviewed. All questions answered. The patient agrees to proceed.

## 2015-09-10 NOTE — Progress Notes (Signed)
  Referred by Dr. Elease Hashimoto Subjective:    Patient ID: Adam [name], male    DOB: [birth year], 64 y.o.   MRN: [id number] Cc: hx colon polyps HPI The patient has a hx multiple colonoscopies and hx polyps - records show he has had adenomas in past. Colonoscopy exams other states 1995, 2000, 2003, 2006, 2009 and 2013. In 2013 exam no polyps but bxs showed ileitis and colitis with pathologist suggesting Crohn's disease. He does not have diarrhea and does not remember being told that. In fact denies any GI sxs.  Medications, allergies, past medical history, past surgical history, family history and social history are reviewed and updated in the EMR.  Review of Systems As above, chronic dyspnea - improved some lately. 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    Objective:   Physical Exam @BP  150/80 mmHg  Pulse 68  Ht 5\' 10"  (1.778 m)  Wt 260 lb (117.935 kg)  BMI 37.31 kg/m2@  General:  Well-developed, well-nourished and in no acute distress  obese Eyes:  anicteric. ENT:   Mouth and posterior pharynx free of lesions.  Neck:   supple w/o thyromegaly or mass.  Lungs: Clear to auscultation bilaterally. Heart:  S1S2, no rubs, murmurs, gallops. Abdomen:  soft, non-tender, no hepatosplenomegaly, hernia, or mass and BS+.  Rectal: deferred Lymph:  no cervical or supraclavicular adenopathy. Extremities:   no edema, cyanosis or clubbing Skin   Moh's defect vertex Neuro:  A&O x 3.  Psych:  appropriate mood and  Affect.   Data Reviewed: 06/2015 labs in EMR 2013 colonoscopy and pathology reports    Assessment & Plan:  Hx of adenomatous colonic polyps - Plan: Ambulatory referral to Gastroenterology  Colitis/ileitis - Plan: Ambulatory referral to Gastroenterology  Will evaluate with colonoscopy. The risks and benefits as well as alternatives of endoscopic procedure(s) have been discussed and reviewed. All questions answered. The patient agrees to proceed.  Mathis Bud, MD

## 2015-09-11 ENCOUNTER — Encounter: Payer: Self-pay | Admitting: Internal Medicine

## 2015-09-18 ENCOUNTER — Telehealth: Payer: Self-pay | Admitting: Family Medicine

## 2015-09-18 MED ORDER — LISINOPRIL-HYDROCHLOROTHIAZIDE 20-25 MG PO TABS: 1.0000 | ORAL_TABLET | Freq: Every day | ORAL | Status: DC

## 2015-09-18 NOTE — Telephone Encounter (Signed)
Pt request refill of the following:  lisinopril-hydrochlorothiazide (PRINZIDE,ZESTORETIC) 20-25 MG per tablet  Phamacy:  [facility]

## 2015-09-18 NOTE — Telephone Encounter (Signed)
Medication sent into pharmacy  

## 2015-09-23 ENCOUNTER — Encounter: Payer: Self-pay | Admitting: Family Medicine

## 2015-09-23 ENCOUNTER — Other Ambulatory Visit: Payer: Self-pay | Admitting: Emergency Medicine

## 2015-09-24 ENCOUNTER — Other Ambulatory Visit: Payer: Self-pay | Admitting: Family Medicine

## 2015-09-24 MED ORDER — LORAZEPAM 0.5 MG PO TABS: ORAL_TABLET | ORAL | Status: DC

## 2015-09-24 NOTE — Telephone Encounter (Signed)
Please advise.  RX sent in on 06/19/2015 #120, 2 rf CPE was 06/17/2015  Okay to refill?

## 2015-09-24 NOTE — Telephone Encounter (Signed)
Duplicate Message

## 2015-09-26 ENCOUNTER — Ambulatory Visit (INDEPENDENT_AMBULATORY_CARE_PROVIDER_SITE_OTHER): Payer: Medicare Other | Admitting: Emergency Medicine

## 2015-09-26 DIAGNOSIS — J449 Chronic obstructive pulmonary disease, unspecified: Secondary | ICD-10-CM | POA: Diagnosis not present

## 2015-09-26 LAB — PULMONARY FUNCTION TEST
DL/VA % pred: 106 %
DL/VA: 4.97 ml/min/mmHg/L
DLCO UNC: 30.57 ml/min/mmHg
DLCO unc % pred: 90 %
FEF 25-75 POST: 2.95 L/s
FEF 25-75 Pre: 1.64 L/sec
FEF2575-%Change-Post: 80 %
FEF2575-%PRED-PRE: 58 %
FEF2575-%Pred-Post: 105 %
FEV1-%Change-Post: 16 %
FEV1-%PRED-PRE: 70 %
FEV1-%Pred-Post: 82 %
FEV1-PRE: 2.53 L
FEV1-Post: 2.93 L
FEV1FVC-%Change-Post: 6 %
FEV1FVC-%PRED-PRE: 94 %
FEV6-%CHANGE-POST: 8 %
FEV6-%PRED-POST: 84 %
FEV6-%PRED-PRE: 78 %
FEV6-POST: 3.84 L
FEV6-Pre: 3.54 L
FEV6FVC-%CHANGE-POST: 0 %
FEV6FVC-%PRED-POST: 103 %
FEV6FVC-%Pred-Pre: 104 %
FVC-%Change-Post: 9 %
FVC-%PRED-PRE: 74 %
FVC-%Pred-Post: 81 %
FVC-POST: 3.9 L
FVC-PRE: 3.57 L
POST FEV6/FVC RATIO: 98 %
PRE FEV1/FVC RATIO: 71 %
Post FEV1/FVC ratio: 75 %
Pre FEV6/FVC Ratio: 99 %
RV % pred: 118 %
RV: 2.86 L
TLC % PRED: 110 %
TLC: 7.97 L

## 2015-09-26 NOTE — Progress Notes (Signed)
PFT done today. 

## 2015-10-05 ENCOUNTER — Encounter: Payer: Self-pay | Admitting: Family Medicine

## 2015-10-05 HISTORY — PX: COLONOSCOPY W/ BIOPSIES: SHX1374

## 2015-10-07 ENCOUNTER — Other Ambulatory Visit: Payer: Self-pay | Admitting: Family Medicine

## 2015-10-07 MED ORDER — EZETIMIBE 10 MG PO TABS: 10.0000 mg | ORAL_TABLET | Freq: Every day | ORAL | Status: DC

## 2015-10-07 MED ORDER — SIMVASTATIN 80 MG PO TABS: 80.0000 mg | ORAL_TABLET | Freq: Every day | ORAL | Status: DC

## 2015-10-07 NOTE — Telephone Encounter (Signed)
Okay for the 2 separate medications?

## 2015-10-15 ENCOUNTER — Encounter: Payer: Self-pay | Admitting: Emergency Medicine

## 2015-11-17 ENCOUNTER — Telehealth: Payer: Self-pay | Admitting: Family Medicine

## 2015-11-17 DIAGNOSIS — Z131 Encounter for screening for diabetes mellitus: Secondary | ICD-10-CM

## 2015-11-17 NOTE — Telephone Encounter (Signed)
Pt call to request a lab to have his A1C check. Need an order please.Marland Kitchen

## 2015-11-17 NOTE — Telephone Encounter (Signed)
Order entered.  Please schedule

## 2015-11-17 NOTE — Telephone Encounter (Signed)
Last A1C checked was 06/11/2016 with a reading off 5.7. Please advise if okay to schedule lab only.

## 2015-11-17 NOTE — Telephone Encounter (Signed)
OK 

## 2015-11-18 NOTE — Telephone Encounter (Signed)
Spoke with wife she will  have pt call back and schedule

## 2015-11-20 ENCOUNTER — Encounter: Payer: Self-pay | Admitting: Internal Medicine

## 2015-11-20 ENCOUNTER — Ambulatory Visit (AMBULATORY_SURGERY_CENTER): Payer: Medicare Other | Admitting: Internal Medicine

## 2015-11-20 VITALS — BP 149/91 | HR 92 | Temp 98.1°F | Resp 21 | Ht 70.0 in | Wt 260.0 lb

## 2015-11-20 DIAGNOSIS — Z86010 Personal history of colonic polyps: Secondary | ICD-10-CM | POA: Diagnosis not present

## 2015-11-20 DIAGNOSIS — J449 Chronic obstructive pulmonary disease, unspecified: Secondary | ICD-10-CM | POA: Diagnosis not present

## 2015-11-20 DIAGNOSIS — F341 Dysthymic disorder: Secondary | ICD-10-CM | POA: Diagnosis not present

## 2015-11-20 DIAGNOSIS — K5730 Diverticulosis of large intestine without perforation or abscess without bleeding: Secondary | ICD-10-CM

## 2015-11-20 DIAGNOSIS — I2510 Atherosclerotic heart disease of native coronary artery without angina pectoris: Secondary | ICD-10-CM | POA: Diagnosis not present

## 2015-11-20 DIAGNOSIS — E669 Obesity, unspecified: Secondary | ICD-10-CM | POA: Diagnosis not present

## 2015-11-20 DIAGNOSIS — J45909 Unspecified asthma, uncomplicated: Secondary | ICD-10-CM | POA: Diagnosis not present

## 2015-11-20 DIAGNOSIS — K635 Polyp of colon: Secondary | ICD-10-CM | POA: Diagnosis not present

## 2015-11-20 DIAGNOSIS — E119 Type 2 diabetes mellitus without complications: Secondary | ICD-10-CM | POA: Diagnosis not present

## 2015-11-20 DIAGNOSIS — K5190 Ulcerative colitis, unspecified, without complications: Secondary | ICD-10-CM | POA: Diagnosis not present

## 2015-11-20 DIAGNOSIS — D123 Benign neoplasm of transverse colon: Secondary | ICD-10-CM

## 2015-11-20 DIAGNOSIS — I10 Essential (primary) hypertension: Secondary | ICD-10-CM | POA: Diagnosis not present

## 2015-11-20 DIAGNOSIS — K219 Gastro-esophageal reflux disease without esophagitis: Secondary | ICD-10-CM | POA: Diagnosis not present

## 2015-11-20 MED ORDER — SODIUM CHLORIDE 0.9 % IV SOLN: 500.0000 mL | INTRAVENOUS | Status: DC

## 2015-11-20 NOTE — Op Note (Signed)
[place]  [address] [place], [id number]   COLONOSCOPY PROCEDURE REPORT  PATIENT: Adam [name], Adam [name]  MR#: [id number] BIRTHDATE: 27-Dec-1950 , 11  yrs. old GENDER: male ENDOSCOPIST: Gatha Mayer, MD, [facility] PROCEDURE DATE:  11/20/2015 PROCEDURE:   Colonoscopy, surveillance , Colonoscopy with biopsy, and Colonoscopy with snare polypectomy First Screening Colonoscopy - Avg.  risk and is 50 yrs.  old or older - No.  Prior Negative Screening - Now for repeat screening. N/A  History of Adenoma - Now for follow-up colonoscopy & has been > or = to 3 yrs.  Yes hx of adenoma.  Has been 3 or more years since last colonoscopy.  Polyps removed today? Yes ASA CLASS:   Class III INDICATIONS:Surveillance due to prior colonic neoplasia and PH Colon Adenoma. MEDICATIONS: Propofol 400 mg IV, Monitored anesthesia care, and Lidocaine 40 mg IV  DESCRIPTION OF PROCEDURE:   After the risks benefits and alternatives of the procedure were thoroughly explained, informed consent was obtained.  The digital rectal exam revealed no abnormalities of the rectum, revealed no prostatic nodules, and revealed the prostate was not enlarged.   The LB [id number] endoscope was introduced through the anus and advanced to the terminal ileum which was intubated for a short distance. No adverse events experienced.   The quality of the prep was excellent. (MiraLax was used)  The instrument was then slowly withdrawn as the colon was fully examined. Estimated blood loss is zero unless otherwise noted in this procedure report.      COLON FINDINGS: Two polypoid shaped sessile polyps ranging from 3 to 39mm in size were found in the transverse colon.  Polypectomies were performed with a cold snare.  The resection was complete, the polyp tissue was completely retrieved and sent to histology.   ? colitis in distal descending and sigmoid colon - aphthae - confluent and scattered.  Biopsies taken.    There was mild diverticulosis noted in the sigmoid colon.   The examination was otherwise normal. Retroflexed views revealed no abnormalities. The time to cecum = 2.7 Withdrawal time = 11.9   The scope was withdrawn and the procedure completed. COMPLICATIONS: There were no immediate complications.  ENDOSCOPIC IMPRESSION: 1.   Two sessile polyps ranging from 3 to 23mm in size were found in the transverse colon; polypectomies were performed with a cold snare 2.   ? colitis in distal descending and sigmoid colon - aphthae - confluent and scattered.  Biopsies taken  - no ileitis seen - he had colitis and ileitis at 2013 colonoscopy but no sxs 3.   There was mild diverticulosis noted in the sigmoid colon 4.   The examination was otherwise normal - excellent prep  RECOMMENDATIONS: Await pathology results  eSigned:  Gatha Mayer, MD, [facility] 11/20/2015 10:11 AM   cc: The Patient and Dr. Carolann Littler   PATIENT NAME:  Adam [name], Adam [name] MR#: [id number]

## 2015-11-20 NOTE — Patient Instructions (Addendum)
I found and removed 2 tiny polyps. I also took biopsies of some areas of the colon that looked inflamed.  I will let you know pathology results and when to have another routine colonoscopy by mail or phone.  I appreciate the opportunity to care for you. Gatha Mayer, MD, FACG     YOU HAD AN ENDOSCOPIC PROCEDURE TODAY AT [facility]:   Refer to the procedure report that was given to you for any specific questions about what was found during the examination.  If the procedure report does not answer your questions, please call your gastroenterologist to clarify.  If you requested that your care partner not be given the details of your procedure findings, then the procedure report has been included in a sealed envelope for you to review at your convenience later.  YOU SHOULD EXPECT: Some feelings of bloating in the abdomen. Passage of more gas than usual.  Walking can help get rid of the air that was put into your GI tract during the procedure and reduce the bloating. If you had a lower endoscopy (such as a colonoscopy or flexible sigmoidoscopy) you may notice spotting of blood in your stool or on the toilet paper. If you underwent a bowel prep for your procedure, you may not have a normal bowel movement for a few days.  Please Note:  You might notice some irritation and congestion in your nose or some drainage.  This is from the oxygen used during your procedure.  There is no need for concern and it should clear up in a day or so.  SYMPTOMS TO REPORT IMMEDIATELY:   Following lower endoscopy (colonoscopy or flexible sigmoidoscopy):  Excessive amounts of blood in the stool  Significant tenderness or worsening of abdominal pains  Swelling of the abdomen that is new, acute  Fever of 100F or higher  For urgent or emergent issues, a gastroenterologist can be reached at any hour by calling [phone].   DIET: Your first meal following the procedure should be a  small meal and then it is ok to progress to your normal diet. Heavy or fried foods are harder to digest and may make you feel nauseous or bloated.  Likewise, meals heavy in dairy and vegetables can increase bloating.  Drink plenty of fluids but you should avoid alcoholic beverages for 24 hours.  ACTIVITY:  You should plan to take it easy for the rest of today and you should NOT DRIVE or use heavy machinery until tomorrow (because of the sedation medicines used during the test).    FOLLOW UP: Our staff will call the number listed on your records the next business day following your procedure to check on you and address any questions or concerns that you may have regarding the information given to you following your procedure. If we do not reach you, we will leave a message.  However, if you are feeling well and you are not experiencing any problems, there is no need to return our call.  We will assume that you have returned to your regular daily activities without incident.  If any biopsies were taken you will be contacted by phone or by letter within the next 1-3 weeks.  Please call us at [phone] if you have not heard about the biopsies in 3 weeks.    SIGNATURES/CONFIDENTIALITY: You and/or your care partner have signed paperwork which will be entered into your electronic medical record.  These signatures attest to the fact  that that the information above on your After Visit Summary has been reviewed and is understood.  Full responsibility of the confidentiality of this discharge information lies with you and/or your care-partner.    Handouts were given to your care partner on polyps and diverticulosis. You may resume your current medications today. Your blood sugar was 107. Await biopsy results. Please call if any questions or concerns.

## 2015-11-20 NOTE — Progress Notes (Signed)
No problems noted in the recovery room. maw 

## 2015-11-20 NOTE — Progress Notes (Signed)
Stable to RR 

## 2015-11-20 NOTE — Progress Notes (Signed)
Called to room to assist during endoscopic procedure.  Patient ID and intended procedure confirmed with present staff. Received instructions for my participation in the procedure from the performing physician.  

## 2015-11-21 ENCOUNTER — Other Ambulatory Visit: Payer: Self-pay | Admitting: Family Medicine

## 2015-11-21 ENCOUNTER — Telehealth: Payer: Self-pay | Admitting: *Deleted

## 2015-11-21 ENCOUNTER — Encounter: Payer: Self-pay | Admitting: Family Medicine

## 2015-11-21 MED ORDER — INSULIN GLARGINE 300 UNIT/ML ~~LOC~~ SOPN: 80.0000 [IU] | PEN_INJECTOR | SUBCUTANEOUS | Status: DC

## 2015-11-21 NOTE — Telephone Encounter (Signed)
  Follow up Call-  Call back number 11/20/2015  Post procedure Call Back phone  # [phone]  Permission to leave phone message Yes     Patient questions:  Message left to call us if necessary.

## 2015-11-25 ENCOUNTER — Telehealth: Payer: Self-pay | Admitting: Internal Medicine

## 2015-11-25 NOTE — Telephone Encounter (Signed)
On the AVS from the procedure 11/20/15 in the medication section it says "stop taking Invokana and Zetia".  Patient would like you to clarify if he need to stop it?  If yes why?  For how long?

## 2015-11-25 NOTE — Telephone Encounter (Signed)
Returning call from nurse. C-B to [id number]

## 2015-11-25 NOTE — Telephone Encounter (Signed)
That must be erroneous - these should be added back to his med list  Sorry for the error

## 2015-11-25 NOTE — Telephone Encounter (Signed)
Patient notified

## 2015-11-25 NOTE — Telephone Encounter (Signed)
Left message for patient to call back  

## 2015-11-26 ENCOUNTER — Encounter: Payer: Self-pay | Admitting: Family Medicine

## 2015-11-26 ENCOUNTER — Encounter: Payer: Self-pay | Admitting: Internal Medicine

## 2015-11-26 DIAGNOSIS — Z85828 Personal history of other malignant neoplasm of skin: Secondary | ICD-10-CM | POA: Diagnosis not present

## 2015-11-26 DIAGNOSIS — L570 Actinic keratosis: Secondary | ICD-10-CM | POA: Diagnosis not present

## 2015-11-26 DIAGNOSIS — L821 Other seborrheic keratosis: Secondary | ICD-10-CM | POA: Diagnosis not present

## 2015-11-26 DIAGNOSIS — L820 Inflamed seborrheic keratosis: Secondary | ICD-10-CM | POA: Diagnosis not present

## 2015-11-26 NOTE — Progress Notes (Signed)
Quick Note:  2 adenomas max 5 mm Colitis - no clear cause ______

## 2015-12-01 ENCOUNTER — Ambulatory Visit (INDEPENDENT_AMBULATORY_CARE_PROVIDER_SITE_OTHER): Payer: Medicare Other | Admitting: Family Medicine

## 2015-12-01 DIAGNOSIS — Z23 Encounter for immunization: Secondary | ICD-10-CM

## 2015-12-11 ENCOUNTER — Other Ambulatory Visit: Payer: Self-pay | Admitting: Family Medicine

## 2015-12-11 ENCOUNTER — Other Ambulatory Visit (INDEPENDENT_AMBULATORY_CARE_PROVIDER_SITE_OTHER): Payer: Medicare Other

## 2015-12-11 ENCOUNTER — Encounter: Payer: Self-pay | Admitting: Family Medicine

## 2015-12-11 DIAGNOSIS — E119 Type 2 diabetes mellitus without complications: Secondary | ICD-10-CM

## 2015-12-11 LAB — HEMOGLOBIN A1C: HEMOGLOBIN A1C: 5.9 % (ref 4.6–6.5)

## 2015-12-25 ENCOUNTER — Other Ambulatory Visit: Payer: Self-pay | Admitting: General Practice

## 2015-12-25 MED ORDER — ALLOPURINOL 300 MG PO TABS: 300.0000 mg | ORAL_TABLET | Freq: Every day | ORAL | Status: DC

## 2015-12-30 ENCOUNTER — Encounter: Payer: Self-pay | Admitting: Emergency Medicine

## 2015-12-30 ENCOUNTER — Ambulatory Visit (INDEPENDENT_AMBULATORY_CARE_PROVIDER_SITE_OTHER): Payer: Medicare Other | Admitting: Emergency Medicine

## 2015-12-30 VITALS — BP 116/70 | HR 99 | Ht 70.0 in | Wt 258.0 lb

## 2015-12-30 DIAGNOSIS — I10 Essential (primary) hypertension: Secondary | ICD-10-CM

## 2015-12-30 DIAGNOSIS — J309 Allergic rhinitis, unspecified: Secondary | ICD-10-CM | POA: Diagnosis not present

## 2015-12-30 DIAGNOSIS — J449 Chronic obstructive pulmonary disease, unspecified: Secondary | ICD-10-CM

## 2015-12-30 MED ORDER — VALSARTAN-HYDROCHLOROTHIAZIDE 160-25 MG PO TABS: 1.0000 | ORAL_TABLET | Freq: Every day | ORAL | Status: DC

## 2015-12-30 MED ORDER — FLUTICASONE FUROATE-VILANTEROL 100-25 MCG/INH IN AEPB: 1.0000 | INHALATION_SPRAY | Freq: Every day | RESPIRATORY_TRACT | Status: DC

## 2015-12-30 NOTE — Assessment & Plan Note (Signed)
I'll change his ACE inhibitor to an ARB given persistent cough and UA irritation

## 2015-12-30 NOTE — Assessment & Plan Note (Signed)
Please continue singulair and flonase as you are taking them Try adding loratadine 10mg  daily in October 2017 and take it through the winter.

## 2015-12-30 NOTE — Progress Notes (Signed)
Subjective:    Patient ID: Adam [name], male    DOB: 06/20/1951, 65 y.o.   MRN: [id number]  HPI 65 year old gentleman, former smoker (20 pack years) with a history of hypertension, coronary artery disease/CABG, restless leg syndrome, followed by Dr. Gwenette Greet for COPD.  He has been treated for acute bronchitis x 2 since his last visit with Dr Gwenette Greet in [place] '15. He experiences DOE with walking hills. He is able to ride stationary bike. He has daily cough, prod of clear, mostly in the am. Has increased his albuterol use. He is on flonase. He reports that his pramipexole was just increased due to breakthrough symptoms and his rest leg syndrome with improvement.   Spirometry from [facility] 2013 reviewed by me today > FVC 4.79L, FEV1 3.30L, ratio 69%. Predicted values not available at this time  ROV 08/22/15 -- follow-up visit for COPD. At our last visit we stopped Advair and changed him to Spiriva. We planned on performing PFTs but this has not yet been done. He likes the change, may be having fewer nocturnal sx. Since last visit he developed increase cough, clear sputum, more head congestion. Was treated with prednisone, helped him temporarily.  Was just started on augmentin. He is currently not on flonase.   ROV 12/30/15 -- patient with a history of COPD. Also with a history of chronic rhinitis. He underwent pulmonary function testing on 09/26/15 that I have personally reviewed. Spirometry showed mixed restriction and obstruction, with positive bronchodilator response that confirmed obstruction. His lung volumes were in the normal range as was his diffusion capacity. He had significant worsening of his cough and dyspnea during the Fall. He remains on singulair and flonase. He uses advil and sinus. Uses albuterol about 1x a week.     CAT Score 07/22/2015  Total CAT Score 21    Review of Systems As per hpi  Past Medical History  Diagnosis Date  . Hypertension   . Coronary artery disease      x 6  . COPD (chronic obstructive pulmonary disease) ([place])   . Asthma   . Arthritis   . Depression   . GERD (gastroesophageal reflux disease)   . Allergy   . Hyperlipidemia   . Family history of polyps in the colon   . Anxiety     history of PTSD following CABG  . Gout   . Prediabetes   . H/O atrial fibrillation without current medication     following CABG with no documented episodes since then.  . RLS (restless legs syndrome)   . Colitis- colonoscopy 2014 07/13/2015  . Hx of adenomatous colonic polyps 08/12/2010  . 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 Colon cancer Mother   . Heart disease Paternal Grandmother   . Heart disease Paternal Grandfather      Social History   Social History  . Marital Status: Unknown    Spouse Name: N/A  . Number of Children: N/A  . Years of Education: N/A   Occupational History  . retired    Social History Main Topics  . Smoking status: Former Smoker -- 2.00 packs/day for 10 years    Types: Cigarettes    Quit date: 10/04/1977  . Smokeless tobacco: Never Used     Comment: 2PPD at heaviest  . Alcohol Use: Yes     Comment: 1.5 bottles of wine weekly  . Drug Use:  No     Comment: Smoked Marijuana back in 1970s  . Sexual Activity: Yes   Other Topics Concern  . 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 Clindamycin/Lincomycin   . Keflex [Cephalexin]     rash     Outpatient Prescriptions Prior to Visit  Medication Sig Dispense Refill  . albuterol (PROAIR HFA) 108 (90 BASE) MCG/ACT inhaler Inhale 1-2 puffs into the lungs every 6 (six) hours as needed for wheezing or shortness of breath. 8 g 5  . allopurinol (ZYLOPRIM) 300 MG tablet Take 1 tablet (300 mg total) by mouth daily. 30 tablet 3  .  aspirin 81 MG tablet Take 81 mg by mouth daily.    Marland Kitchen atenolol (TENORMIN) 100 MG tablet Take 1 tablet (100 mg total) by mouth 2 (two) times daily. 180 tablet 1  . Canagliflozin (INVOKANA PO) Take by mouth daily.    . Coenzyme Q10 (CO Q-10) 100 MG CAPS Take 1 tablet by mouth daily.     . fenofibrate 160 MG tablet TAKE 1 TABLET BY MOUTH DAILY 30 tablet 5  . fluticasone (FLONASE) 50 MCG/ACT nasal spray Place 1 spray into the nose daily.     Marland Kitchen glucose blood (ONETOUCH VERIO) test strip USE AS DIRECTED Check 1 time daily. E11.9 100 each 3  . Insulin Glargine (TOUJEO SOLOSTAR) 300 UNIT/ML SOPN Inject 80 Units into the skin as directed. 1 pen 3  . Insulin Pen Needle (BD PEN NEEDLE NANO U/F) 32G X 4 MM MISC Use as instructed 100 each 3  . lisinopril-hydrochlorothiazide (PRINZIDE,ZESTORETIC) 20-25 MG tablet Take 1 tablet by mouth daily. 90 tablet 1  . LORazepam (ATIVAN) 0.5 MG tablet TAKE 1 TABLET BY MOUTH EVERY 4 HOURS AND TAKE 2 TABLETS BY MOUTH AT BEDTIME 120 tablet 2  . Magnesium Oxide (MAG-200 PO) Take 1 tablet by mouth daily. Reported on 11/20/2015    . metFORMIN (GLUCOPHAGE) 1000 MG tablet Take 1 tablet (1,000 mg total) by mouth 2 (two) times daily with a meal. 60 tablet 11  . montelukast (SINGULAIR) 10 MG tablet Take 1 tablet (10 mg total) by mouth at bedtime. 90 tablet 3  . Olopatadine HCl (PATADAY) 0.2 % SOLN Apply 1 drop to eye as needed. 2.5 mL 5  . ONETOUCH DELICA LANCETS FINE MISC 1 Device by Other route as needed. Use as instructed.  DX: 250.00 100 each 3  . pantoprazole (PROTONIX) 40 MG tablet Take 1 tablet (40 mg total) by mouth 2 (two) times daily. 30 tablet 5  . pramipexole (MIRAPEX) 0.25 MG tablet Take 1 tablet (0.25 mg total) by mouth at bedtime. 90 tablet 3  . simvastatin (ZOCOR) 80 MG tablet Take 1 tablet (80 mg total) by mouth daily. 90 tablet 3  . SPIRIVA HANDIHALER 18 MCG inhalation capsule inhale contents of 1 capsule by mouth once daily 30 capsule 5   No facility-administered  medications prior to visit.       Objective:   Physical Exam Filed Vitals:   12/30/15 1403 12/30/15 1404  BP:  116/70  Pulse:  99  Height: 5\' 10"  (1.778 m)   Weight: 258 lb (117.028 kg)   SpO2:  96%   Gen: Pleasant, overwt man, in no distress,  normal affect  ENT: No lesions,  mouth clear,  oropharynx clear, no postnasal drip  Neck: No JVD, no TMG, no carotid bruits  Lungs: No use of accessory muscles, clear without rales or rhonchi  Cardiovascular: RRR, heart sounds normal, no murmur or gallops, no peripheral edema  Musculoskeletal: No deformities, no cyanosis or clubbing  Neuro: alert, non focal  Skin: Warm, no lesions or rashes      Assessment & Plan:  COPD (chronic obstructive pulmonary disease) Please continue your Spiriva as you are taking it, daily Please continue singulair and flonase as you are taking them Try adding loratadine 10mg  daily in October 2017 and take it through the winter.  We will try starting Breo daily for a month, then determine whether to stay on this medication long term.  Please stop lisinopril-HCTZ.  We will start valsartan-HCTZ 160-25mg  once a day Follow with Dr Lamonte Sakai in 3 months or sooner if you have any problems.  Allergic rhinitis Please continue singulair and flonase as you are taking them Try adding loratadine 10mg  daily in October 2017 and take it through the winter.   Essential hypertension, benign I'll change his ACE inhibitor to an ARB given persistent cough and UA irritation

## 2015-12-30 NOTE — Patient Instructions (Addendum)
Please continue your Spiriva as you are taking it, daily Please continue singulair and flonase as you are taking them Try adding loratadine 10mg  daily in October 2017 and take it through the winter.  We will try starting Breo daily for a month, then determine whether to stay on this medication long term.  Please stop lisinopril-HCTZ.  We will start valsartan-HCTZ 160-25mg  once a day Follow with Dr Lamonte Sakai in 3 months or sooner if you have any problems.

## 2015-12-30 NOTE — Addendum Note (Signed)
Addended by: Desmond Dike C on: 12/30/2015 02:38 PM   Modules accepted: Orders, SmartSet

## 2015-12-30 NOTE — Assessment & Plan Note (Signed)
Please continue your Spiriva as you are taking it, daily Please continue singulair and flonase as you are taking them Try adding loratadine 10mg  daily in October 2017 and take it through the winter.  We will try starting Breo daily for a month, then determine whether to stay on this medication long term.  Please stop lisinopril-HCTZ.  We will start valsartan-HCTZ 160-25mg  once a day Follow with Dr Lamonte Sakai in 3 months or sooner if you have any problems.

## 2016-01-03 LAB — HM DIABETES EYE EXAM

## 2016-01-06 ENCOUNTER — Encounter: Payer: Self-pay | Admitting: Emergency Medicine

## 2016-01-12 ENCOUNTER — Encounter: Payer: Self-pay | Admitting: Family Medicine

## 2016-01-14 ENCOUNTER — Ambulatory Visit: Payer: Medicare Other | Admitting: Family Medicine

## 2016-01-23 ENCOUNTER — Other Ambulatory Visit: Payer: Self-pay | Admitting: Family Medicine

## 2016-01-23 ENCOUNTER — Encounter: Payer: Self-pay | Admitting: Family Medicine

## 2016-01-23 ENCOUNTER — Encounter: Payer: Self-pay | Admitting: Emergency Medicine

## 2016-01-23 MED ORDER — LORAZEPAM 0.5 MG PO TABS: ORAL_TABLET | ORAL | Status: DC

## 2016-01-23 NOTE — Telephone Encounter (Signed)
4.21.17 mychart email from patient: Message     Hi Dr. Lamonte Sakai,    Has [facility] Medicare contacted you about Valsartan HCTZ 160-25?    It is not on their formulary but it can be overridden. I am just following up before the Rx runs out.    It has helped reduce my coughing BTW.    Thank you    Adam [name]    Pt seen 3.28.17 and Lisinopril d/c'd d/t ACE cough, Diovan HCT begun.  Per previous emails, Insurance will not cover this med. Ria Comment, have you seen anything on this patient?  If not, perhaps we can route to [facility] for assistance with the PA.

## 2016-01-29 DIAGNOSIS — E119 Type 2 diabetes mellitus without complications: Secondary | ICD-10-CM | POA: Diagnosis not present

## 2016-02-02 ENCOUNTER — Encounter: Payer: Self-pay | Admitting: Family Medicine

## 2016-02-02 ENCOUNTER — Encounter: Payer: Self-pay | Admitting: Emergency Medicine

## 2016-02-03 ENCOUNTER — Other Ambulatory Visit: Payer: Self-pay

## 2016-02-03 MED ORDER — INSULIN GLARGINE 300 UNIT/ML ~~LOC~~ SOPN: 80.0000 [IU] | PEN_INJECTOR | SUBCUTANEOUS | Status: DC

## 2016-02-03 NOTE — Telephone Encounter (Signed)
Patient sent emails since 01/23/16 requesting refill on Valsartan.  Patient states that he ran out of the medication before we responded to his email, so he started taking the Lisinopril again.  Patient said that the valsartan helped his cough.  I believe there was an issue with his insurance covering the medication.  We can do PA if needed.  Sent message back to patient requesting a copy of his current BP readings and update on his cough.  Awaiting response back from patient regarding this, will then send message to Dr. Lamonte Sakai to address. Hold in my box until completed.

## 2016-02-04 ENCOUNTER — Telehealth: Payer: Self-pay | Admitting: Emergency Medicine

## 2016-02-04 DIAGNOSIS — L812 Freckles: Secondary | ICD-10-CM | POA: Diagnosis not present

## 2016-02-04 DIAGNOSIS — L821 Other seborrheic keratosis: Secondary | ICD-10-CM | POA: Diagnosis not present

## 2016-02-04 DIAGNOSIS — L570 Actinic keratosis: Secondary | ICD-10-CM | POA: Diagnosis not present

## 2016-02-04 DIAGNOSIS — D1801 Hemangioma of skin and subcutaneous tissue: Secondary | ICD-10-CM | POA: Diagnosis not present

## 2016-02-04 DIAGNOSIS — Z85828 Personal history of other malignant neoplasm of skin: Secondary | ICD-10-CM | POA: Diagnosis not present

## 2016-02-04 NOTE — Telephone Encounter (Signed)
Patient was on Lisinopril and was switched to Valsartan HCTZ.  The Valsartan was not on patient's formulary and we were going to try to override that decision through PA.  However, this has not been done.  Patient states that he ran out of the Valsartan and had to go back to taking his Lisinopril that he had left over.  Patient states that he has not heard anything from anyone in our office since last month and was upset that no one contacted him to let him know what to do.  Patient says that his BP has been fine, he says that with the Valsartan his cough went away.    RB - please advise if you want patient to continue Valsartan.  If so, I will need to do PA on it.

## 2016-02-05 ENCOUNTER — Encounter: Payer: Self-pay | Admitting: Emergency Medicine

## 2016-02-05 NOTE — Telephone Encounter (Signed)
We will need his formulary to determine which ARB-HCTZ he can get from insurance. They will likely not accept PO for valsartan unless he fails their preferred med.

## 2016-02-05 NOTE — Telephone Encounter (Signed)
Patient aware of Dr. Agustina Caroli recommendations. See MyChart notes. Nothing further needed.

## 2016-02-06 MED ORDER — LOSARTAN POTASSIUM-HCTZ 100-25 MG PO TABS: 1.0000 | ORAL_TABLET | Freq: Every day | ORAL | Status: DC

## 2016-02-06 NOTE — Telephone Encounter (Signed)
RB reviewed the pt's formulary and he would like to change the pt to Losartan HCTZ 100-25mg  daily. Pt is aware of this information per telephone conversation. Rx has been sent in.

## 2016-02-06 NOTE — Telephone Encounter (Signed)
Pt dropped off book gave to triage

## 2016-02-06 NOTE — Telephone Encounter (Signed)
Call back # is [phone]

## 2016-02-17 ENCOUNTER — Encounter: Payer: Self-pay | Admitting: Family Medicine

## 2016-02-17 ENCOUNTER — Encounter: Payer: Self-pay | Admitting: Emergency Medicine

## 2016-02-18 ENCOUNTER — Other Ambulatory Visit: Payer: Self-pay | Admitting: Family Medicine

## 2016-02-18 MED ORDER — PANTOPRAZOLE SODIUM 40 MG PO TBEC: 40.0000 mg | DELAYED_RELEASE_TABLET | Freq: Two times a day (BID) | ORAL | Status: DC

## 2016-02-18 MED ORDER — ATENOLOL 100 MG PO TABS: 100.0000 mg | ORAL_TABLET | Freq: Two times a day (BID) | ORAL | Status: DC

## 2016-02-18 MED ORDER — INSULIN GLARGINE 300 UNIT/ML ~~LOC~~ SOPN: 80.0000 [IU] | PEN_INJECTOR | SUBCUTANEOUS | Status: DC

## 2016-02-19 ENCOUNTER — Encounter: Payer: Self-pay | Admitting: Family Medicine

## 2016-02-19 ENCOUNTER — Telehealth: Payer: Self-pay | Admitting: General Practice

## 2016-02-20 ENCOUNTER — Other Ambulatory Visit: Payer: Self-pay | Admitting: Family Medicine

## 2016-02-20 MED ORDER — CANAGLIFLOZIN 300 MG PO TABS: ORAL_TABLET | ORAL | Status: DC

## 2016-02-20 NOTE — Telephone Encounter (Signed)
Refill for 6 months .  Pt needs office follow up soon.

## 2016-02-20 NOTE — Addendum Note (Signed)
Addended by: Elio Forget on: 02/20/2016 09:14 AM   Modules accepted: Orders

## 2016-03-24 ENCOUNTER — Encounter: Payer: Self-pay | Admitting: Family Medicine

## 2016-03-31 ENCOUNTER — Encounter: Payer: Self-pay | Admitting: Emergency Medicine

## 2016-03-31 ENCOUNTER — Ambulatory Visit (INDEPENDENT_AMBULATORY_CARE_PROVIDER_SITE_OTHER): Payer: Medicare Other | Admitting: Emergency Medicine

## 2016-03-31 VITALS — BP 122/78 | HR 98 | Ht 70.0 in | Wt 259.0 lb

## 2016-03-31 DIAGNOSIS — R053 Chronic cough: Secondary | ICD-10-CM | POA: Insufficient documentation

## 2016-03-31 DIAGNOSIS — J449 Chronic obstructive pulmonary disease, unspecified: Secondary | ICD-10-CM | POA: Diagnosis not present

## 2016-03-31 DIAGNOSIS — J309 Allergic rhinitis, unspecified: Secondary | ICD-10-CM | POA: Diagnosis not present

## 2016-03-31 DIAGNOSIS — R05 Cough: Secondary | ICD-10-CM | POA: Diagnosis not present

## 2016-03-31 NOTE — Progress Notes (Signed)
Subjective:    Patient ID: Adam [name], male    DOB: Mar 24, 1951, 65 y.o.   MRN: [id number]  HPI 65 year old gentleman, former smoker (20 pack years) with a history of hypertension, coronary artery disease/CABG, restless leg syndrome, followed by Dr. Gwenette Greet for COPD.  He has been treated for acute bronchitis x 2 since his last visit with Dr Gwenette Greet in [place] '15. He experiences DOE with walking hills. He is able to ride stationary bike. He has daily cough, prod of clear, mostly in the am. Has increased his albuterol use. He is on flonase. He reports that his pramipexole was just increased due to breakthrough symptoms and his rest leg syndrome with improvement.   Spirometry from [facility] 2013 reviewed by me today > FVC 4.79L, FEV1 3.30L, ratio 69%. Predicted values not available at this time  ROV 08/22/15 -- follow-up visit for COPD. At our last visit we stopped Advair and changed him to Spiriva. We planned on performing PFTs but this has not yet been done. He likes the change, may be having fewer nocturnal sx. Since last visit he developed increase cough, clear sputum, more head congestion. Was treated with prednisone, helped him temporarily.  Was just started on augmentin. He is currently not on flonase.   ROV 12/30/15 -- patient with a history of COPD. Also with a history of chronic rhinitis. He underwent pulmonary function testing on 09/26/15 that I have personally reviewed. Spirometry showed mixed restriction and obstruction, with positive bronchodilator response that confirmed obstruction. His lung volumes were in the normal range as was his diffusion capacity. He had significant worsening of his cough and dyspnea during the Fall. He remains on singulair and flonase. He uses advil and sinus. Uses albuterol about 1x a week.   ROV 03/31/16 -- follow-up visit for COPD with a positive bronchodilator response, chronic rhinitis with chronic cough. At his last visit we continued Spiriva, Singulair,  Flonase. We added loratadine and Breo to his regimen. We also stopped his lisinopril HCTZ and started valsartan. He feels that he is much better - breathing, cough is less.    CAT Score 07/22/2015  Total CAT Score 21    Review of Systems As per hpi  Past Medical History  Diagnosis Date  . Hypertension   . Coronary artery disease     x 6  . COPD (chronic obstructive pulmonary disease) ([place])   . Asthma   . Arthritis   . Depression   . GERD (gastroesophageal reflux disease)   . Allergy   . Hyperlipidemia   . Family history of polyps in the colon   . Anxiety     history of PTSD following CABG  . Gout   . Prediabetes   . H/O atrial fibrillation without current medication     following CABG with no documented episodes since then.  . RLS (restless legs syndrome)   . Colitis- colonoscopy 2014 07/13/2015  . Hx of adenomatous colonic polyps 08/12/2010  . 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 Colon cancer Mother   . Heart disease Paternal Grandmother   . Heart disease Paternal Grandfather      Social History   Social History  . Marital Status: Unknown    Spouse Name: N/A  . Number of Children: N/A  . Years of Education: N/A   Occupational History  . retired    Social History Main Topics  .  Smoking status: Former Smoker -- 2.00 packs/day for 10 years    Types: Cigarettes    Quit date: 10/04/1977  . Smokeless tobacco: Never Used     Comment: 2PPD at heaviest  . Alcohol Use: Yes     Comment: 1.5 bottles of wine weekly  . Drug Use: No     Comment: Smoked Marijuana back in 1970s  . Sexual Activity: Yes   Other Topics Concern  . 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 Clindamycin/Lincomycin   . Keflex [Cephalexin]     rash       Outpatient Prescriptions Prior to Visit  Medication Sig Dispense Refill  . albuterol (PROAIR HFA) 108 (90 BASE) MCG/ACT inhaler Inhale 1-2 puffs into the lungs every 6 (six) hours as needed for wheezing or shortness of breath. 8 g 5  . allopurinol (ZYLOPRIM) 300 MG tablet Take 1 tablet (300 mg total) by mouth daily. 30 tablet 3  . aspirin 81 MG tablet Take 81 mg by mouth daily.    Marland Kitchen atenolol (TENORMIN) 100 MG tablet Take 1 tablet (100 mg total) by mouth 2 (two) times daily. 180 tablet 1  . canagliflozin (INVOKANA) 300 MG TABS tablet Take one tablet by mouth daily. 90 tablet 2  . Coenzyme Q10 (CO Q-10) 100 MG CAPS Take 1 tablet by mouth daily.     . fenofibrate 160 MG tablet TAKE 1 TABLET BY MOUTH DAILY 30 tablet 5  . fluticasone (FLONASE) 50 MCG/ACT nasal spray Place 1 spray into the nose daily.     . fluticasone furoate-vilanterol (BREO ELLIPTA) 100-25 MCG/INH AEPB Inhale 1 puff into the lungs daily. 60 each 5  . glucose blood (ONETOUCH VERIO) test strip USE AS DIRECTED Check 1 time daily. E11.9 100 each 3  . Insulin Glargine (TOUJEO SOLOSTAR) 300 UNIT/ML SOPN Inject 80 Units into the skin as directed. 3 pen 3  . Insulin Pen Needle (BD PEN NEEDLE NANO U/F) 32G X 4 MM MISC Use as instructed 100 each 3  . LORazepam (ATIVAN) 0.5 MG tablet TAKE 1 TABLET BY MOUTH EVERY 4 HOURS AND TAKE 2 TABLETS BY MOUTH AT BEDTIME 90 tablet 2  . losartan-hydrochlorothiazide (HYZAAR) 100-25 MG tablet Take 1 tablet by mouth daily. 30 tablet 5  . Magnesium Oxide (MAG-200 PO) Take 1 tablet by mouth daily. Reported on 11/20/2015    . metFORMIN (GLUCOPHAGE) 1000 MG tablet Take 1 tablet (1,000 mg total) by mouth 2 (two) times daily with a meal. 60 tablet 11  . montelukast (SINGULAIR) 10 MG tablet Take 1 tablet (10 mg total) by mouth at bedtime. 90 tablet 3  . Olopatadine HCl (PATADAY) 0.2 % SOLN Apply 1 drop to eye as needed. 2.5 mL 5  . ONETOUCH DELICA LANCETS FINE MISC 1 Device by Other route as needed. Use as  instructed.  DX: 250.00 100 each 3  . pantoprazole (PROTONIX) 40 MG tablet Take 1 tablet (40 mg total) by mouth 2 (two) times daily. 180 tablet 1  . pramipexole (MIRAPEX) 0.25 MG tablet Take 1 tablet (0.25 mg total) by mouth at bedtime. 90 tablet 3  . simvastatin (ZOCOR) 80 MG tablet Take 1 tablet (80 mg total) by mouth daily. 90 tablet 3  . SPIRIVA HANDIHALER 18 MCG inhalation capsule inhale  contents of 1 capsule by mouth once daily 30 capsule 5  . valsartan-hydrochlorothiazide (DIOVAN HCT) 160-25 MG tablet Take 1 tablet by mouth daily. 30 tablet 5  . lisinopril-hydrochlorothiazide (PRINZIDE,ZESTORETIC) 20-25 MG tablet Take 1 tablet by mouth daily. 90 tablet 1   No facility-administered medications prior to visit.       Objective:   Physical Exam Filed Vitals:   03/31/16 1345  BP: 122/78  Pulse: 98  Height: 5\' 10"  (1.778 m)  Weight: 259 lb (117.482 kg)  SpO2: 95%   Gen: Pleasant, overwt man, in no distress,  normal affect  ENT: No lesions,  mouth clear,  oropharynx clear, no postnasal drip  Neck: No JVD, no TMG, no carotid bruits  Lungs: No use of accessory muscles, clear without rales or rhonchi  Cardiovascular: RRR, heart sounds normal, no murmur or gallops, no peripheral edema  Musculoskeletal: No deformities, no cyanosis or clubbing  Neuro: alert, non focal  Skin: Warm, no lesions or rashes      Assessment & Plan:  COPD (chronic obstructive pulmonary disease) Please continue your medications as you have been taking them  We will revisit your meds next Spring to consider what we may be able to decrease.  Get the flu shot in the Fall.  You will be due to get the Pneumovax 23 in September 2017.  Follow with Dr Lamonte Sakai in 9 months or sooner if you have any problems  Allergic rhinitis Improved on current regimen  Chronic cough Improved with more aggressive treatment of his allergies and with discontinuation of his lisinopril/HCTZ. We will continue the valsartan,  similar, Flonase. He will use loratadine if symptoms flare. Asked him to speak with Dr. Elease Hashimoto about possibly adding back the HCTZ component of his blood pressure medication.   Baltazar Apo, MD, PhD 03/31/2016, 2:05 PM [facility] [phone] or if no answer [phone]

## 2016-03-31 NOTE — Assessment & Plan Note (Signed)
Improved with more aggressive treatment of his allergies and with discontinuation of his lisinopril/HCTZ. We will continue the valsartan, similar, Flonase. He will use loratadine if symptoms flare. Asked him to speak with Dr. Elease Hashimoto about possibly adding back the HCTZ component of his blood pressure medication.

## 2016-03-31 NOTE — Assessment & Plan Note (Signed)
Improved on current regimen 

## 2016-03-31 NOTE — Assessment & Plan Note (Signed)
Please continue your medications as you have been taking them  We will revisit your meds next Spring to consider what we may be able to decrease.  Get the flu shot in the Fall.  You will be due to get the Pneumovax 23 in September 2017.  Follow with Dr Lamonte Sakai in 9 months or sooner if you have any problems

## 2016-03-31 NOTE — Patient Instructions (Addendum)
Please continue your medications as you have been taking them  We will revisit your meds next Spring to consider what we may be able to decrease.  Get the flu shot in the Fall.  You will be due to get the Pneumovax 23 in September 2017.  Follow with Dr Lamonte Sakai in 9 months or sooner if you have any problems

## 2016-04-08 ENCOUNTER — Encounter: Payer: Self-pay | Admitting: Family Medicine

## 2016-04-08 ENCOUNTER — Encounter: Payer: Self-pay | Admitting: Emergency Medicine

## 2016-04-08 ENCOUNTER — Other Ambulatory Visit: Payer: Self-pay

## 2016-04-08 MED ORDER — SILDENAFIL CITRATE 100 MG PO TABS: 100.0000 mg | ORAL_TABLET | Freq: Every day | ORAL | Status: DC | PRN

## 2016-04-08 MED ORDER — TIOTROPIUM BROMIDE MONOHYDRATE 18 MCG IN CAPS: ORAL_CAPSULE | RESPIRATORY_TRACT | Status: DC

## 2016-04-22 ENCOUNTER — Other Ambulatory Visit: Payer: Self-pay | Admitting: General Practice

## 2016-04-22 ENCOUNTER — Encounter: Payer: Self-pay | Admitting: Family Medicine

## 2016-04-22 MED ORDER — ALLOPURINOL 300 MG PO TABS: 300.0000 mg | ORAL_TABLET | Freq: Every day | ORAL | Status: DC

## 2016-04-23 ENCOUNTER — Encounter: Payer: Self-pay | Admitting: Family Medicine

## 2016-04-23 ENCOUNTER — Other Ambulatory Visit: Payer: Self-pay

## 2016-04-23 MED ORDER — LORAZEPAM 0.5 MG PO TABS: ORAL_TABLET | ORAL | Status: DC

## 2016-05-10 ENCOUNTER — Encounter: Payer: Self-pay | Admitting: Family Medicine

## 2016-05-10 ENCOUNTER — Other Ambulatory Visit: Payer: Self-pay | Admitting: Family Medicine

## 2016-05-11 MED ORDER — INSULIN GLARGINE 300 UNIT/ML ~~LOC~~ SOPN: 80.0000 [IU] | PEN_INJECTOR | SUBCUTANEOUS | 3 refills | Status: DC

## 2016-05-11 MED ORDER — SILDENAFIL CITRATE 100 MG PO TABS: 100.0000 mg | ORAL_TABLET | Freq: Every day | ORAL | 0 refills | Status: DC | PRN

## 2016-05-19 ENCOUNTER — Telehealth: Payer: Self-pay | Admitting: Family Medicine

## 2016-05-19 ENCOUNTER — Encounter: Payer: Self-pay | Admitting: Family Medicine

## 2016-05-19 NOTE — Telephone Encounter (Signed)
manufacturer is discontinuing the  atenolol (TENORMIN) 100 MG tablet  They would like to know if ok to use the 25mg  or 50 mg instead [facility]/ [place]

## 2016-05-19 NOTE — Telephone Encounter (Signed)
Are 2 50mg  tablet once per day okay?

## 2016-05-20 NOTE — Telephone Encounter (Signed)
Yes.  OK to take two 50 mg tablets once daily.  Would actually be ideal to have him on one 50 mg tablet BID

## 2016-05-20 NOTE — Telephone Encounter (Signed)
I called the pt and informed him of the message below and he stated he has been taking Atenolol 100mg  twice a day to equal 200mg  so he questioned the instructions below?  Patient also wanted to let Dr Elease Hashimoto know his heart doctor, Dr Thurman Coyer office sent an email to him stating Atenolol was being discontinued and their office suggested Metoprolol Tartrate 100mg  BID?  Please let the pt know what is recommended and he stated he needs something as he is going out of town tomorrow.

## 2016-05-21 ENCOUNTER — Other Ambulatory Visit: Payer: Self-pay | Admitting: Family Medicine

## 2016-05-21 ENCOUNTER — Other Ambulatory Visit: Payer: Self-pay | Admitting: *Deleted

## 2016-05-21 MED ORDER — LORAZEPAM 0.5 MG PO TABS: ORAL_TABLET | ORAL | 0 refills | Status: DC

## 2016-05-21 NOTE — Telephone Encounter (Signed)
He should follow Dr Thurman Coyer suggestion.

## 2016-05-21 NOTE — Telephone Encounter (Signed)
Patient sent Mychart message requesting refill of Lorazepam 0.5 mg. Per pharmacy, patient only have a 1x refill of 90 tablets on 04/23/16. Spoke with Dr. Elease Hashimoto and he advised to change instructions "1 tablet every 8 hours as needed" and 90 day supply with 0 refills. Rx called in.

## 2016-05-21 NOTE — Telephone Encounter (Signed)
° °  Pt is at the pharmacy and they told him they do not have the following med. Pt is at the pharmacy waiting.    LORAZEPAM    Pharmacy [facility]

## 2016-05-21 NOTE — Telephone Encounter (Signed)
I called the pharmacy and spoke with the pharmacist Mortimer Fries and gave him the Rx information from the Rx that [facility] phoned in earlier today.

## 2016-05-28 ENCOUNTER — Ambulatory Visit (INDEPENDENT_AMBULATORY_CARE_PROVIDER_SITE_OTHER): Payer: Medicare Other | Admitting: *Deleted

## 2016-05-28 DIAGNOSIS — Z23 Encounter for immunization: Secondary | ICD-10-CM

## 2016-05-30 ENCOUNTER — Encounter: Payer: Self-pay | Admitting: Emergency Medicine

## 2016-05-31 ENCOUNTER — Encounter: Payer: Self-pay | Admitting: Family Medicine

## 2016-05-31 ENCOUNTER — Telehealth: Payer: Self-pay | Admitting: Emergency Medicine

## 2016-05-31 MED ORDER — DOXYCYCLINE HYCLATE 100 MG PO TABS: 100.0000 mg | ORAL_TABLET | Freq: Two times a day (BID) | ORAL | 0 refills | Status: DC

## 2016-05-31 NOTE — Telephone Encounter (Signed)
Patient notified of Dr. Agustina Caroli recommendations.  Rx sent to pharmacy. Nothing further needed.

## 2016-05-31 NOTE — Telephone Encounter (Signed)
Informed patient that I would send a urgent message to the nurse, patient became upset, cursing and states he does not want to speak to the nurse and only wants to speak to [place], please advise

## 2016-05-31 NOTE — Telephone Encounter (Signed)
plezase tell him that based on his sx I believe it would be appropriate to treat him for acute bronchitis w doxycycline 100 bid x 7 days. If he is not improving then he should call so we can see him. I would like for him to wait to get flu shot until he is over this illness.

## 2016-05-31 NOTE — Telephone Encounter (Signed)
Called and spoke to pt. Pt irritated that he was told to refer to his PCP about his respiratory symptoms. Apologized to pt and assured him to contact us when he is having respiratory symptoms. Pt c/o increase in SOB, prod cough with grey mucus and burning sensation in midsternal chest area x 2-3 days. Pt states he has had cold sweats but has not taken temperature. Pt denies wheezing. Pt is requesting recs. Pt last seen on 6/28 by RB and advised to f/u in 9 months.   Dr. Lamonte Sakai please advise. Thanks.

## 2016-06-09 DIAGNOSIS — C44329 Squamous cell carcinoma of skin of other parts of face: Secondary | ICD-10-CM | POA: Diagnosis not present

## 2016-06-09 DIAGNOSIS — L570 Actinic keratosis: Secondary | ICD-10-CM | POA: Diagnosis not present

## 2016-06-09 DIAGNOSIS — D485 Neoplasm of uncertain behavior of skin: Secondary | ICD-10-CM | POA: Diagnosis not present

## 2016-06-09 DIAGNOSIS — L739 Follicular disorder, unspecified: Secondary | ICD-10-CM | POA: Diagnosis not present

## 2016-06-18 ENCOUNTER — Encounter: Payer: Self-pay | Admitting: Family Medicine

## 2016-06-21 ENCOUNTER — Other Ambulatory Visit: Payer: Self-pay | Admitting: Family Medicine

## 2016-06-21 ENCOUNTER — Other Ambulatory Visit: Payer: Self-pay

## 2016-06-21 ENCOUNTER — Encounter: Payer: Self-pay | Admitting: Family Medicine

## 2016-06-21 DIAGNOSIS — E119 Type 2 diabetes mellitus without complications: Secondary | ICD-10-CM

## 2016-06-22 ENCOUNTER — Other Ambulatory Visit: Payer: Self-pay | Admitting: Family Medicine

## 2016-06-22 ENCOUNTER — Other Ambulatory Visit (INDEPENDENT_AMBULATORY_CARE_PROVIDER_SITE_OTHER): Payer: Medicare Other

## 2016-06-22 DIAGNOSIS — R7989 Other specified abnormal findings of blood chemistry: Secondary | ICD-10-CM

## 2016-06-22 DIAGNOSIS — Z0000 Encounter for general adult medical examination without abnormal findings: Secondary | ICD-10-CM | POA: Diagnosis not present

## 2016-06-22 LAB — CBC WITH DIFFERENTIAL/PLATELET
BASOS PCT: 0.2 % (ref 0.0–3.0)
Basophils Absolute: 0 10*3/uL (ref 0.0–0.1)
EOS ABS: 0.1 10*3/uL (ref 0.0–0.7)
EOS PCT: 2.2 % (ref 0.0–5.0)
HCT: 44.6 % (ref 39.0–52.0)
HEMOGLOBIN: 15.5 g/dL (ref 13.0–17.0)
Lymphocytes Relative: 26.6 % (ref 12.0–46.0)
Lymphs Abs: 1.5 10*3/uL (ref 0.7–4.0)
MCHC: 34.8 g/dL (ref 30.0–36.0)
MCV: 97.9 fl (ref 78.0–100.0)
MONO ABS: 1 10*3/uL (ref 0.1–1.0)
Monocytes Relative: 17.3 % — ABNORMAL HIGH (ref 3.0–12.0)
Neutro Abs: 3 10*3/uL (ref 1.4–7.7)
Neutrophils Relative %: 53.7 % (ref 43.0–77.0)
PLATELETS: 157 10*3/uL (ref 150.0–400.0)
RBC: 4.55 Mil/uL (ref 4.22–5.81)
RDW: 13.2 % (ref 11.5–15.5)
WBC: 5.6 10*3/uL (ref 4.0–10.5)

## 2016-06-22 LAB — BASIC METABOLIC PANEL
BUN: 16 mg/dL (ref 6–23)
CHLORIDE: 98 meq/L (ref 96–112)
CO2: 30 meq/L (ref 19–32)
CREATININE: 0.96 mg/dL (ref 0.40–1.50)
Calcium: 9.7 mg/dL (ref 8.4–10.5)
GFR: 83.29 mL/min (ref >60.00)
GLUCOSE: 128 mg/dL — AB (ref 70–99)
POTASSIUM: 3.6 meq/L (ref 3.5–5.1)
Sodium: 139 mEq/L (ref 135–145)

## 2016-06-22 LAB — LIPID PANEL
Cholesterol: 176 mg/dL (ref 0–200)
HDL: 41.5 mg/dL (ref >39.00)
NonHDL: 134.26
TRIGLYCERIDES: 279 mg/dL — AB (ref 0.0–149.0)
Total CHOL/HDL Ratio: 4
VLDL: 55.8 mg/dL — AB (ref 0.0–40.0)

## 2016-06-22 LAB — HEPATIC FUNCTION PANEL
ALBUMIN: 4.2 g/dL (ref 3.5–5.2)
ALT: 49 U/L (ref 0–53)
AST: 40 U/L — AB (ref 0–37)
Alkaline Phosphatase: 30 U/L — ABNORMAL LOW (ref 39–117)
Bilirubin, Direct: 0.2 mg/dL (ref 0.0–0.3)
Total Bilirubin: 0.7 mg/dL (ref 0.2–1.2)
Total Protein: 7 g/dL (ref 6.0–8.3)

## 2016-06-22 LAB — MICROALBUMIN / CREATININE URINE RATIO
CREATININE, U: 96.4 mg/dL
MICROALB/CREAT RATIO: 2.7 mg/g (ref 0.0–30.0)
Microalb, Ur: 2.6 mg/dL — ABNORMAL HIGH (ref 0.0–1.9)

## 2016-06-22 LAB — PSA: PSA: 0.26 ng/mL (ref 0.10–4.00)

## 2016-06-22 LAB — LDL CHOLESTEROL, DIRECT: LDL DIRECT: 107 mg/dL

## 2016-06-22 LAB — HEMOGLOBIN A1C: HEMOGLOBIN A1C: 6.2 % (ref 4.6–6.5)

## 2016-06-22 LAB — TSH: TSH: 2.02 u[IU]/mL (ref 0.35–4.50)

## 2016-06-23 DIAGNOSIS — E119 Type 2 diabetes mellitus without complications: Secondary | ICD-10-CM | POA: Diagnosis not present

## 2016-06-25 ENCOUNTER — Other Ambulatory Visit: Payer: Self-pay | Admitting: Family Medicine

## 2016-06-25 ENCOUNTER — Ambulatory Visit (INDEPENDENT_AMBULATORY_CARE_PROVIDER_SITE_OTHER): Payer: Medicare Other | Admitting: Family Medicine

## 2016-06-25 ENCOUNTER — Other Ambulatory Visit: Payer: Self-pay

## 2016-06-25 ENCOUNTER — Encounter: Payer: Self-pay | Admitting: Family Medicine

## 2016-06-25 VITALS — BP 120/74 | HR 96 | Temp 97.8°F | Ht 70.0 in | Wt 261.9 lb

## 2016-06-25 DIAGNOSIS — Z0000 Encounter for general adult medical examination without abnormal findings: Secondary | ICD-10-CM

## 2016-06-25 MED ORDER — METOPROLOL SUCCINATE ER 100 MG PO TB24
100.0000 mg | ORAL_TABLET | Freq: Every day | ORAL | 3 refills | Status: DC
Start: 2016-06-25 — End: 2016-12-21

## 2016-06-25 MED ORDER — LORAZEPAM 0.5 MG PO TABS: ORAL_TABLET | ORAL | 0 refills | Status: DC

## 2016-06-25 NOTE — Progress Notes (Signed)
Subjective:     Patient ID: Adam [name], male   DOB: 26-May-1951, 65 y.o.   MRN: [id number]  HPI Patient seen for physical exam. His chronic problems include history of obesity, type 2 diabetes, COPD, hypertension, CAD, gout. Medications reviewed. Colonoscopy up-to-date. He's had recent eye exam in April. Immunizations are up-to-date. He will need Pneumovax booster by February. Has already had flu vaccine. His diabetes has been well controlled. No hypoglycemic symptoms. No recent chest pains.  Does still consume about 4 and occasionally 5 alcoholic beverages per day. He does not feel he has a drinking problem. He is compliant with all medications  Patient had recent [facility] screening test which were all basically normal  Past Medical History:  Diagnosis Date  . Allergy   . Anxiety    history of PTSD following CABG  . Arthritis   . Asthma   . Colitis- colonoscopy 2014 07/13/2015  . COPD (chronic obstructive pulmonary disease) ([place])   . Coronary artery disease    x 6  . Depression   . Family history of polyps in the colon   . GERD (gastroesophageal reflux disease)   . Gout   . H/O atrial fibrillation without current medication    following CABG with no documented episodes since then.  Marland Kitchen Hx of adenomatous colonic polyps 08/12/2010  . Hyperlipidemia   . Hypertension   . Prediabetes   . RLS (restless legs syndrome)   . 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 APPENDECTOMY    . COLONOSCOPY W/ BIOPSIES    . CORONARY ANGIOPLASTY WITH STENT PLACEMENT    . CORONARY ARTERY BYPASS GRAFT    . TOTAL HIP ARTHROPLASTY      reports that he quit smoking about 38 years ago. His smoking use included Cigarettes. He has a 20.00 pack-year smoking history. He has never used smokeless tobacco. He reports that he drinks alcohol. He reports that he does not use drugs. family history includes Colon cancer in his mother; Heart disease in his  paternal grandfather and paternal grandmother. Allergies  Allergen Reactions  . Clindamycin/Lincomycin   . Keflex [Cephalexin]     rash     Review of Systems  Constitutional: Negative for activity change, appetite change, fatigue and fever.  HENT: Negative for congestion, ear pain and trouble swallowing.   Eyes: Negative for pain and visual disturbance.  Respiratory: Negative for cough, shortness of breath and wheezing.   Cardiovascular: Negative for chest pain and palpitations.  Gastrointestinal: Negative for abdominal distention, abdominal pain, blood in stool, constipation, diarrhea, nausea, rectal pain and vomiting.  Endocrine: Negative for polydipsia and polyuria.  Genitourinary: Negative for dysuria, hematuria and testicular pain.  Musculoskeletal: Negative for arthralgias and joint swelling.  Skin: Negative for rash.  Neurological: Negative for dizziness, syncope and headaches.  Hematological: Negative for adenopathy.  Psychiatric/Behavioral: Negative for confusion and dysphoric mood.       Objective:   Physical Exam  Constitutional: He is oriented to person, place, and time. He appears well-developed and well-nourished. No distress.  HENT:  Head: Normocephalic and atraumatic.  Right Ear: External ear normal.  Left Ear: External ear normal.  Mouth/Throat: Oropharynx is clear and moist.  Eyes: Conjunctivae and EOM are normal. Pupils are equal, round, and reactive to light.  Neck: Normal range of motion. Neck supple. No thyromegaly present.  Cardiovascular: Normal rate, regular rhythm and normal heart sounds.   No murmur heard.  Pulmonary/Chest: No respiratory distress. He has no wheezes. He has no rales.  Abdominal: Soft. Bowel sounds are normal. He exhibits no distension and no mass. There is no tenderness. There is no rebound and no guarding.  Musculoskeletal: He exhibits no edema.  Lymphadenopathy:    He has no cervical adenopathy.  Neurological: He is alert and  oriented to person, place, and time. He displays normal reflexes. No cranial nerve deficit.  Skin: No rash noted.  Psychiatric: He has a normal mood and affect.       Assessment:     Physical exam. Patient will need Pneumovax in February. Flu vaccine already given. Other health maintenance up-to-date    Plan:     -we discussed the need for him to lose some weight and also scale back alcohol use - Discussed hypertriglyceridemia and ways to reduce - Continue yearly eye exam - Routine follow-up in 6 months and recheck A1c then  [name] MD [facility]

## 2016-06-25 NOTE — Patient Instructions (Signed)
Lose some weight Scale back alcohol use. let's plan 6 month follow up to reassess the diabetes.

## 2016-06-25 NOTE — Progress Notes (Signed)
Pre visit review using our clinic review tool, if applicable. No additional management support is needed unless otherwise documented below in the visit note. 

## 2016-06-27 NOTE — Addendum Note (Signed)
Addended by: [name] on: 06/27/2016 01:36 PM   Modules accepted: Orders

## 2016-07-16 DIAGNOSIS — E785 Hyperlipidemia, unspecified: Secondary | ICD-10-CM | POA: Diagnosis not present

## 2016-07-16 DIAGNOSIS — J45909 Unspecified asthma, uncomplicated: Secondary | ICD-10-CM | POA: Diagnosis not present

## 2016-07-16 DIAGNOSIS — I10 Essential (primary) hypertension: Secondary | ICD-10-CM | POA: Diagnosis not present

## 2016-07-16 DIAGNOSIS — Z951 Presence of aortocoronary bypass graft: Secondary | ICD-10-CM | POA: Diagnosis not present

## 2016-07-16 DIAGNOSIS — I2510 Atherosclerotic heart disease of native coronary artery without angina pectoris: Secondary | ICD-10-CM | POA: Diagnosis not present

## 2016-07-16 DIAGNOSIS — E119 Type 2 diabetes mellitus without complications: Secondary | ICD-10-CM | POA: Diagnosis not present

## 2016-07-16 DIAGNOSIS — E668 Other obesity: Secondary | ICD-10-CM | POA: Diagnosis not present

## 2016-07-16 DIAGNOSIS — J449 Chronic obstructive pulmonary disease, unspecified: Secondary | ICD-10-CM | POA: Diagnosis not present

## 2016-07-21 DIAGNOSIS — C44329 Squamous cell carcinoma of skin of other parts of face: Secondary | ICD-10-CM | POA: Diagnosis not present

## 2016-07-25 ENCOUNTER — Encounter: Payer: Self-pay | Admitting: Family Medicine

## 2016-07-27 ENCOUNTER — Other Ambulatory Visit: Payer: Self-pay

## 2016-07-27 MED ORDER — LORAZEPAM 0.5 MG PO TABS: ORAL_TABLET | ORAL | 0 refills | Status: DC

## 2016-07-27 MED ORDER — FLUTICASONE FUROATE-VILANTEROL 100-25 MCG/INH IN AEPB: 1.0000 | INHALATION_SPRAY | Freq: Every day | RESPIRATORY_TRACT | 5 refills | Status: DC

## 2016-07-27 MED ORDER — LOSARTAN POTASSIUM-HCTZ 100-25 MG PO TABS: 1.0000 | ORAL_TABLET | Freq: Every day | ORAL | 5 refills | Status: DC

## 2016-07-30 ENCOUNTER — Telehealth: Payer: Self-pay | Admitting: Emergency Medicine

## 2016-07-30 MED ORDER — DOXYCYCLINE HYCLATE 100 MG PO TABS: 100.0000 mg | ORAL_TABLET | Freq: Two times a day (BID) | ORAL | 0 refills | Status: DC

## 2016-07-30 NOTE — Telephone Encounter (Signed)
Called and spoke with pt and he stated that he has been sick for about 1.5 days with chest burning, cough with greenish/yellow sputum, wheezing but denies any fever.  He stated that he had the doxy about 60 days ago with the same symptoms.  RB please advise. Thanks  Allergies  Allergen Reactions  . Clindamycin/Lincomycin   . Keflex [Cephalexin]     rash

## 2016-07-30 NOTE — Telephone Encounter (Signed)
Spoke with pt. He is aware of RB's recommendation. Rx has been sent in. Nothing further was needed. 

## 2016-07-30 NOTE — Telephone Encounter (Signed)
Ok to give him doxy 100 bid x 7 days

## 2016-08-03 DIAGNOSIS — L570 Actinic keratosis: Secondary | ICD-10-CM | POA: Diagnosis not present

## 2016-08-17 ENCOUNTER — Encounter: Payer: Self-pay | Admitting: Family Medicine

## 2016-08-18 ENCOUNTER — Other Ambulatory Visit: Payer: Self-pay

## 2016-08-18 ENCOUNTER — Encounter: Payer: Self-pay | Admitting: Family Medicine

## 2016-08-18 MED ORDER — MONTELUKAST SODIUM 10 MG PO TABS: 10.0000 mg | ORAL_TABLET | Freq: Every day | ORAL | 3 refills | Status: DC

## 2016-08-18 MED ORDER — PRAMIPEXOLE DIHYDROCHLORIDE 0.25 MG PO TABS: 0.2500 mg | ORAL_TABLET | Freq: Every day | ORAL | 3 refills | Status: DC

## 2016-08-21 ENCOUNTER — Encounter: Payer: Self-pay | Admitting: Family Medicine

## 2016-08-23 ENCOUNTER — Other Ambulatory Visit: Payer: Self-pay

## 2016-08-23 MED ORDER — ALLOPURINOL 300 MG PO TABS: 300.0000 mg | ORAL_TABLET | Freq: Every day | ORAL | 3 refills | Status: DC

## 2016-08-23 MED ORDER — CANAGLIFLOZIN 300 MG PO TABS: ORAL_TABLET | ORAL | 3 refills | Status: DC

## 2016-09-01 ENCOUNTER — Encounter: Payer: Self-pay | Admitting: Family Medicine

## 2016-09-03 ENCOUNTER — Other Ambulatory Visit: Payer: Self-pay

## 2016-09-03 MED ORDER — LORAZEPAM 0.5 MG PO TABS: ORAL_TABLET | ORAL | 0 refills | Status: DC

## 2016-09-03 MED ORDER — METFORMIN HCL 1000 MG PO TABS: 1000.0000 mg | ORAL_TABLET | Freq: Two times a day (BID) | ORAL | 3 refills | Status: DC

## 2016-09-08 LAB — HM DIABETES EYE EXAM

## 2016-09-20 ENCOUNTER — Other Ambulatory Visit: Payer: Self-pay | Admitting: Family Medicine

## 2016-09-20 ENCOUNTER — Encounter: Payer: Self-pay | Admitting: Family Medicine

## 2016-09-20 DIAGNOSIS — E119 Type 2 diabetes mellitus without complications: Secondary | ICD-10-CM | POA: Diagnosis not present

## 2016-09-21 ENCOUNTER — Other Ambulatory Visit: Payer: Self-pay

## 2016-09-21 MED ORDER — SIMVASTATIN 80 MG PO TABS: 80.0000 mg | ORAL_TABLET | Freq: Every day | ORAL | 3 refills | Status: DC

## 2016-09-21 MED ORDER — INSULIN GLARGINE 300 UNIT/ML ~~LOC~~ SOPN: 80.0000 [IU] | PEN_INJECTOR | SUBCUTANEOUS | 3 refills | Status: DC

## 2016-09-21 MED ORDER — INSULIN PEN NEEDLE 32G X 4 MM MISC: 3 refills | Status: DC

## 2016-09-21 MED ORDER — PANTOPRAZOLE SODIUM 40 MG PO TBEC: 40.0000 mg | DELAYED_RELEASE_TABLET | Freq: Two times a day (BID) | ORAL | 1 refills | Status: DC

## 2016-09-29 ENCOUNTER — Other Ambulatory Visit: Payer: Self-pay | Admitting: Emergency Medicine

## 2016-09-29 ENCOUNTER — Encounter: Payer: Self-pay | Admitting: Family Medicine

## 2016-09-29 MED ORDER — LORAZEPAM 0.5 MG PO TABS: ORAL_TABLET | ORAL | 0 refills | Status: DC

## 2016-09-29 NOTE — Telephone Encounter (Signed)
Last filled 09/03/16 #90 refills 0  Last office visit 06/25/16   Okay to refills

## 2016-10-15 DIAGNOSIS — L570 Actinic keratosis: Secondary | ICD-10-CM | POA: Diagnosis not present

## 2016-10-23 ENCOUNTER — Other Ambulatory Visit: Payer: Self-pay | Admitting: Family Medicine

## 2016-10-23 ENCOUNTER — Other Ambulatory Visit: Payer: Self-pay | Admitting: Emergency Medicine

## 2016-10-25 NOTE — Telephone Encounter (Signed)
Last OV 06-25-2016 Last refill 09-29-2016 #90, 0rf Please advise

## 2016-10-26 NOTE — Telephone Encounter (Signed)
Refill with 2 additional refills. 

## 2016-11-19 DIAGNOSIS — E785 Hyperlipidemia, unspecified: Secondary | ICD-10-CM | POA: Diagnosis not present

## 2016-11-19 DIAGNOSIS — I10 Essential (primary) hypertension: Secondary | ICD-10-CM | POA: Diagnosis not present

## 2016-11-19 DIAGNOSIS — I2510 Atherosclerotic heart disease of native coronary artery without angina pectoris: Secondary | ICD-10-CM | POA: Diagnosis not present

## 2016-11-19 DIAGNOSIS — R002 Palpitations: Secondary | ICD-10-CM | POA: Diagnosis not present

## 2016-11-21 ENCOUNTER — Other Ambulatory Visit: Payer: Self-pay | Admitting: Family Medicine

## 2016-11-23 DIAGNOSIS — E785 Hyperlipidemia, unspecified: Secondary | ICD-10-CM | POA: Diagnosis not present

## 2016-11-23 DIAGNOSIS — I2510 Atherosclerotic heart disease of native coronary artery without angina pectoris: Secondary | ICD-10-CM | POA: Diagnosis not present

## 2016-11-23 DIAGNOSIS — R002 Palpitations: Secondary | ICD-10-CM | POA: Diagnosis not present

## 2016-11-23 DIAGNOSIS — I10 Essential (primary) hypertension: Secondary | ICD-10-CM | POA: Diagnosis not present

## 2016-11-30 ENCOUNTER — Other Ambulatory Visit: Payer: Self-pay | Admitting: Family Medicine

## 2016-11-30 ENCOUNTER — Ambulatory Visit (INDEPENDENT_AMBULATORY_CARE_PROVIDER_SITE_OTHER): Payer: Medicare Other

## 2016-11-30 DIAGNOSIS — Z23 Encounter for immunization: Secondary | ICD-10-CM

## 2016-11-30 NOTE — Progress Notes (Signed)
Patient got his Pneumovax 23 vaccine on 11/30/16 at 11.15am. Given by Rosealee Albee CMA.

## 2016-12-20 DIAGNOSIS — J45909 Unspecified asthma, uncomplicated: Secondary | ICD-10-CM | POA: Diagnosis not present

## 2016-12-20 DIAGNOSIS — E785 Hyperlipidemia, unspecified: Secondary | ICD-10-CM | POA: Diagnosis not present

## 2016-12-20 DIAGNOSIS — I10 Essential (primary) hypertension: Secondary | ICD-10-CM | POA: Diagnosis not present

## 2016-12-20 DIAGNOSIS — I2510 Atherosclerotic heart disease of native coronary artery without angina pectoris: Secondary | ICD-10-CM | POA: Diagnosis not present

## 2016-12-21 ENCOUNTER — Ambulatory Visit (INDEPENDENT_AMBULATORY_CARE_PROVIDER_SITE_OTHER): Payer: Medicare Other | Admitting: Family Medicine

## 2016-12-21 ENCOUNTER — Encounter: Payer: Self-pay | Admitting: Family Medicine

## 2016-12-21 VITALS — BP 130/80 | HR 90 | Temp 97.9°F | Wt 267.9 lb

## 2016-12-21 DIAGNOSIS — E785 Hyperlipidemia, unspecified: Secondary | ICD-10-CM

## 2016-12-21 DIAGNOSIS — E119 Type 2 diabetes mellitus without complications: Secondary | ICD-10-CM

## 2016-12-21 DIAGNOSIS — I10 Essential (primary) hypertension: Secondary | ICD-10-CM

## 2016-12-21 LAB — POCT GLYCOSYLATED HEMOGLOBIN (HGB A1C): Hemoglobin A1C: 6.4

## 2016-12-21 MED ORDER — EMPAGLIFLOZIN 25 MG PO TABS: 25.0000 mg | ORAL_TABLET | Freq: Every day | ORAL | 3 refills | Status: DC

## 2016-12-21 NOTE — Patient Instructions (Signed)
STOP THE [address] [place].

## 2016-12-21 NOTE — Progress Notes (Signed)
Subjective:     Patient ID: Adam [name], male   DOB: [birth year], 66 y.o.   MRN: [id number]  HPI Patient has multiple chronic medical problems: History of obesity, type 2 diabetes, COPD, hypertension, dyslipidemia, restless leg syndrome, CAD. His diabetes been well controlled but he has had recent problems getting one of his medications covered-invokana.  He has generally had good control. He did not exercise much over the winter but has recently started back.  Recent palpitations and saw cardiologist. Event monitor did not show any atrial fibrillation. Medications reviewed. Compliant with all. No recent hypoglycemia. Blood pressure well controlled. No recent chest pains.  Past Medical History:  Diagnosis Date  . Allergy   . Anxiety    history of PTSD following CABG  . Arthritis   . Asthma   . Colitis- colonoscopy 2014 07/13/2015  . COPD (chronic obstructive pulmonary disease) ([place])   . Coronary artery disease    x 6  . Depression   . Family history of polyps in the colon   . GERD (gastroesophageal reflux disease)   . Gout   . H/O atrial fibrillation without current medication    following CABG with no documented episodes since then.  Marland Kitchen Hx of adenomatous colonic polyps 08/12/2010  . Hyperlipidemia   . Hypertension   . Prediabetes   . RLS (restless legs syndrome)   . 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 APPENDECTOMY    . COLONOSCOPY W/ BIOPSIES    . CORONARY ANGIOPLASTY WITH STENT PLACEMENT    . CORONARY ARTERY BYPASS GRAFT    . TOTAL HIP ARTHROPLASTY      reports that he quit smoking about 39 years ago. His smoking use included Cigarettes. He has a 20.00 pack-year smoking history. He has never used smokeless tobacco. He reports that he drinks alcohol. He reports that he does not use drugs. family history includes Colon cancer in his mother; Heart disease in his paternal grandfather and paternal  grandmother. Allergies  Allergen Reactions  . Clindamycin/Lincomycin   . Keflex [Cephalexin]     rash     Review of Systems  Constitutional: Negative for fatigue.  Eyes: Negative for visual disturbance.  Respiratory: Negative for cough, chest tightness and shortness of breath.   Cardiovascular: Negative for chest pain, palpitations and leg swelling.  Endocrine: Negative for polydipsia and polyuria.  Genitourinary: Negative for dysuria.  Neurological: Negative for dizziness, syncope, weakness, light-headedness and headaches.       Objective:   Physical Exam  Constitutional: He is oriented to person, place, and time. He appears well-developed and well-nourished.  HENT:  Right Ear: External ear normal.  Left Ear: External ear normal.  Mouth/Throat: Oropharynx is clear and moist.  Eyes: Pupils are equal, round, and reactive to light.  Neck: Neck supple. No thyromegaly present.  Cardiovascular: Normal rate and regular rhythm.   Pulmonary/Chest: Effort normal and breath sounds normal. No respiratory distress. He has no wheezes. He has no rales.  Musculoskeletal: He exhibits no edema.  Neurological: He is alert and oriented to person, place, and time.       Assessment:     #1 type 2 diabetes. Well controlled with A1c today 6.4%  #2 hypertension stable and at goal  #3 dyslipidemia    Plan:     -Patient will check to see if he can get Jardiance covered-in place of invokana -He is not a candidate for G  LP-1 class because of remote history of pancreatitis -Continue other current medications -Encouraged to lose some weight -Routine follow-up in 6 months and check labs at that point including A1c, lipid, hepatic, basic metabolic panel  Adam Post MD [facility]

## 2016-12-21 NOTE — Progress Notes (Signed)
Pre visit review using our clinic review tool, if applicable. No additional management support is needed unless otherwise documented below in the visit note. 

## 2016-12-23 ENCOUNTER — Encounter: Payer: Self-pay | Admitting: Family Medicine

## 2016-12-23 DIAGNOSIS — L821 Other seborrheic keratosis: Secondary | ICD-10-CM | POA: Diagnosis not present

## 2016-12-23 DIAGNOSIS — L570 Actinic keratosis: Secondary | ICD-10-CM | POA: Diagnosis not present

## 2016-12-23 DIAGNOSIS — D485 Neoplasm of uncertain behavior of skin: Secondary | ICD-10-CM | POA: Diagnosis not present

## 2016-12-23 DIAGNOSIS — D1801 Hemangioma of skin and subcutaneous tissue: Secondary | ICD-10-CM | POA: Diagnosis not present

## 2016-12-27 ENCOUNTER — Encounter: Payer: Self-pay | Admitting: Family Medicine

## 2016-12-27 DIAGNOSIS — M1611 Unilateral primary osteoarthritis, right hip: Secondary | ICD-10-CM | POA: Diagnosis not present

## 2016-12-27 DIAGNOSIS — M545 Low back pain: Secondary | ICD-10-CM | POA: Diagnosis not present

## 2016-12-27 DIAGNOSIS — M25551 Pain in right hip: Secondary | ICD-10-CM | POA: Diagnosis not present

## 2016-12-27 MED ORDER — GLUCOSE BLOOD VI STRP: ORAL_STRIP | 3 refills | Status: DC

## 2016-12-29 ENCOUNTER — Other Ambulatory Visit: Payer: Self-pay | Admitting: Orthopedic Surgery

## 2016-12-29 DIAGNOSIS — M545 Low back pain: Secondary | ICD-10-CM

## 2016-12-30 ENCOUNTER — Encounter: Payer: Self-pay | Admitting: Emergency Medicine

## 2016-12-30 ENCOUNTER — Ambulatory Visit (INDEPENDENT_AMBULATORY_CARE_PROVIDER_SITE_OTHER): Payer: Medicare Other | Admitting: Emergency Medicine

## 2016-12-30 DIAGNOSIS — J301 Allergic rhinitis due to pollen: Secondary | ICD-10-CM | POA: Diagnosis not present

## 2016-12-30 DIAGNOSIS — J449 Chronic obstructive pulmonary disease, unspecified: Secondary | ICD-10-CM

## 2016-12-30 NOTE — Assessment & Plan Note (Signed)
Discussed adding loratadine to singulair and flonase during the fall and spring months.

## 2016-12-30 NOTE — Patient Instructions (Signed)
Please continue your Spiriva and Breo as you have been taking them.  Keep albuterol available to use 2 puffs up to every 4 hours if needed for shortness of breath.  Continue your singulair and Flonase.  Consider taking the Claritin (loratadine) daily during the Spring and Fall allergy seasons.  Get the Flu shot in the Fall Follow with Dr Lamonte Sakai in 12 months or sooner if you have any problems

## 2016-12-30 NOTE — Progress Notes (Signed)
Subjective:    Patient ID: Adam [name], male    DOB: 1950/11/07, 66 y.o.   MRN: [id number]  HPI 66 year old gentleman, former smoker (20 pack years) with a history of hypertension, coronary artery disease/CABG, restless leg syndrome, followed by Dr. Gwenette Greet for COPD.  He has been treated for acute bronchitis x 2 since his last visit with Dr Gwenette Greet in [place] '15. He experiences DOE with walking hills. He is able to ride stationary bike. He has daily cough, prod of clear, mostly in the am. Has increased his albuterol use. He is on flonase. He reports that his pramipexole was just increased due to breakthrough symptoms and his rest leg syndrome with improvement.   Spirometry from [facility] 2013 reviewed by me today > FVC 4.79L, FEV1 3.30L, ratio 69%. Predicted values not available at this time  ROV 08/22/15 -- follow-up visit for COPD. At our last visit we stopped Advair and changed him to Spiriva. We planned on performing PFTs but this has not yet been done. He likes the change, may be having fewer nocturnal sx. Since last visit he developed increase cough, clear sputum, more head congestion. Was treated with prednisone, helped him temporarily.  Was just started on augmentin. He is currently not on flonase.   ROV 12/30/15 -- patient with a history of COPD. Also with a history of chronic rhinitis. He underwent pulmonary function testing on 09/26/15 that I have personally reviewed. Spirometry showed mixed restriction and obstruction, with positive bronchodilator response that confirmed obstruction. His lung volumes were in the normal range as was his diffusion capacity. He had significant worsening of his cough and dyspnea during the Fall. He remains on singulair and flonase. He uses advil and sinus. Uses albuterol about 1x a week.   ROV 03/31/16 -- follow-up visit for COPD with a positive bronchodilator response, chronic rhinitis with chronic cough. At his last visit we continued Spiriva, Singulair,  Flonase. We added loratadine and Breo to his regimen. We also stopped his lisinopril HCTZ and started valsartan. He feels that he is much better - breathing, cough is less.   ROV 12/30/16 -- patient has a history of COPD with a positive bronchodilator response. Also followed for chronic cough in setting of chronic rhinitis. Currently managed on Spiriva, Breo. Allergy regimen includes . He was treated for bronchitis x 2 since last time - deep cough, productive of phlegm, treated w doxy. He is on singulair, nasal steroid. He is unclear that he still needs the [facility]. He forgets it on some days. He is reliable w Spiriva. Significant decrease in his albuterol use.    No flowsheet data found.  Review of Systems As per hpi      Objective:   Physical Exam Vitals:   12/30/16 0954  BP: (!) 150/80  Pulse: 97  SpO2: 97%  Weight: 265 lb 6.4 oz (120.4 kg)  Height: 5\' 10"  (1.778 m)   Gen: Pleasant, overwt man, in no distress,  normal affect  ENT: No lesions,  mouth clear,  oropharynx clear, no postnasal drip  Neck: No JVD, no TMG, no carotid bruits  Lungs: No use of accessory muscles, clear without rales or rhonchi  Cardiovascular: RRR, heart sounds normal, no murmur or gallops, no peripheral edema  Musculoskeletal: No deformities, no cyanosis or clubbing  Neuro: alert, non focal  Skin: Warm, no lesions or rashes      Assessment & Plan:  COPD (chronic obstructive pulmonary disease) Overall he has been doing well. He had  2 episodes of bronchitis that were treated with antibiotics. He wonders if he could stop Brio but given his improvement in symptoms, less frequent symptoms, I believe he should stay on both this and Spiriva. He agrees. Use albuterol prn.   Allergic rhinitis Discussed adding loratadine to singulair and flonase during the fall and spring months.   Baltazar Apo, MD, PhD 12/30/2016, 10:29 AM [facility] [phone] or if no answer [phone]

## 2016-12-30 NOTE — Assessment & Plan Note (Signed)
Overall he has been doing well. He had 2 episodes of bronchitis that were treated with antibiotics. He wonders if he could stop Brio but given his improvement in symptoms, less frequent symptoms, I believe he should stay on both this and Spiriva. He agrees. Use albuterol prn.

## 2017-01-03 ENCOUNTER — Other Ambulatory Visit: Payer: Self-pay | Admitting: *Deleted

## 2017-01-03 MED ORDER — GLUCOSE BLOOD VI STRP: ORAL_STRIP | 3 refills | Status: DC

## 2017-01-10 ENCOUNTER — Ambulatory Visit
Admission: RE | Admit: 2017-01-10 | Discharge: 2017-01-10 | Disposition: A | Discharge status: Home / Self Care | Payer: Medicare Other | Source: Ambulatory Visit | Attending: Orthopedic Surgery | Admitting: Orthopedic Surgery

## 2017-01-10 DIAGNOSIS — M545 Low back pain: Secondary | ICD-10-CM

## 2017-01-10 DIAGNOSIS — M5126 Other intervertebral disc displacement, lumbar region: Secondary | ICD-10-CM | POA: Diagnosis not present

## 2017-01-18 ENCOUNTER — Other Ambulatory Visit: Payer: Self-pay | Admitting: Family Medicine

## 2017-01-23 ENCOUNTER — Other Ambulatory Visit: Payer: Self-pay | Admitting: Family Medicine

## 2017-01-24 ENCOUNTER — Encounter: Payer: Self-pay | Admitting: Family Medicine

## 2017-01-24 NOTE — Telephone Encounter (Signed)
Refill with one additional refill and office follow up by May to discuss chronic benzo use.

## 2017-01-24 NOTE — Telephone Encounter (Signed)
LORazepam (ATIVAN) 0.5 MG tablet last refill 10/27/16 and last office visit 12/21/16.  Okay to refill?

## 2017-01-24 NOTE — Telephone Encounter (Signed)
Left message on machine for patient to return our call 

## 2017-01-24 NOTE — Telephone Encounter (Signed)
Adam [name] pt returned your call °

## 2017-01-26 ENCOUNTER — Telehealth: Payer: Self-pay | Admitting: Family Medicine

## 2017-01-26 ENCOUNTER — Ambulatory Visit (INDEPENDENT_AMBULATORY_CARE_PROVIDER_SITE_OTHER): Payer: Medicare Other | Admitting: Family Medicine

## 2017-01-26 ENCOUNTER — Encounter: Payer: Self-pay | Admitting: Family Medicine

## 2017-01-26 VITALS — BP 150/90 | HR 82 | Temp 98.3°F | Wt 261.7 lb

## 2017-01-26 DIAGNOSIS — F4310 Post-traumatic stress disorder, unspecified: Secondary | ICD-10-CM

## 2017-01-26 MED ORDER — ESCITALOPRAM OXALATE 10 MG PO TABS
10.0000 mg | ORAL_TABLET | Freq: Every day | ORAL | 11 refills | Status: DC
Start: 2017-01-26 — End: 2017-01-26

## 2017-01-26 MED ORDER — ESCITALOPRAM OXALATE 10 MG PO TABS: 10.0000 mg | ORAL_TABLET | Freq: Every day | ORAL | 1 refills | Status: DC

## 2017-01-26 NOTE — Progress Notes (Signed)
Subjective:     Patient ID: Adam [name], male   DOB: 21-Dec-1950, 66 y.o.   MRN: [id number]  HPI Patient here to discuss anxiety issues. He called recently for refill lorazepam. He states he has been on lorazepam for several years and he came to [place] on this medication. He was initially taking about 6 per day is currently down to total of 3 per day. He's had multiple cardiac procedures and developed reportedly posttraumatic stress disorder several years ago. He states he had intensive counseling for about 5 months. He has daily anxiety symptoms centered around his health. He states on a scale of 1-10 his current anxiety levels are about a 7. When feeling increased anxiety symptoms he tries to focus on positive peaceful thoughts  A rather concerned with his chronic benzodiazepine use is the fact that he still drinks about 2 glasses of wine per day and he states 2-3 drinks of scotch.  Past Medical History:  Diagnosis Date  . Allergy   . Anxiety    history of PTSD following CABG  . Arthritis   . Asthma   . Colitis- colonoscopy 2014 07/13/2015  . COPD (chronic obstructive pulmonary disease) ([place])   . Coronary artery disease    x 6  . Depression   . Family history of polyps in the colon   . GERD (gastroesophageal reflux disease)   . Gout   . H/O atrial fibrillation without current medication    following CABG with no documented episodes since then.  Marland Kitchen Hx of adenomatous colonic polyps 08/12/2010  . Hyperlipidemia   . Hypertension   . Prediabetes   . RLS (restless legs syndrome)   . 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 APPENDECTOMY    . COLONOSCOPY W/ BIOPSIES    . CORONARY ANGIOPLASTY WITH STENT PLACEMENT    . CORONARY ARTERY BYPASS GRAFT    . TOTAL HIP ARTHROPLASTY      reports that he quit smoking about 39 years ago. His smoking use included Cigarettes. He has a 20.00 pack-year smoking history. He has never used  smokeless tobacco. He reports that he drinks alcohol. He reports that he does not use drugs. family history includes Colon cancer in his mother; Heart disease in his paternal grandfather and paternal grandmother. Allergies  Allergen Reactions  . Clindamycin/Lincomycin   . Keflex [Cephalexin]     rash     Review of Systems  Constitutional: Negative for fatigue.  Eyes: Negative for visual disturbance.  Respiratory: Negative for cough, chest tightness and shortness of breath.   Cardiovascular: Negative for chest pain, palpitations and leg swelling.  Neurological: Negative for dizziness, syncope, weakness, light-headedness and headaches.       Objective:   Physical Exam  Constitutional: He is oriented to person, place, and time. He appears well-developed and well-nourished.  HENT:  Right Ear: External ear normal.  Left Ear: External ear normal.  Mouth/Throat: Oropharynx is clear and moist.  Eyes: Pupils are equal, round, and reactive to light.  Neck: Neck supple. No thyromegaly present.  Cardiovascular: Normal rate and regular rhythm.   Pulmonary/Chest: Effort normal and breath sounds normal. No respiratory distress. He has no wheezes. He has no rales.  Musculoskeletal: He exhibits no edema.  Neurological: He is alert and oriented to person, place, and time.       Assessment:     Chronic anxiety. Reported posttraumatic stress disorder. Chronic benzodiazepine use  Plan:     -We discussed potential concerns with long-term benzo use including increased risk of falls, cognitive impairment, potential interactions with medications and alcohol -Offered further counseling for [name] traumatic stress disorder and he was given names of counselors -Trial of Lexapro 10 mg once daily and reassess in one month -If anxiety symptoms improving on Lexapro will consider further tapering of lorazepam in one month. Our goal is to try to taper him off this completely  Adam Post  MD [facility] at [facility]

## 2017-01-26 NOTE — Telephone Encounter (Signed)
Pt need for his LEXAPRO to go to [facility] not [employer].

## 2017-01-26 NOTE — Patient Instructions (Signed)
Posttraumatic Stress Disorder Posttraumatic stress disorder (PTSD) is a mental health disorder that can occur after a traumatic event, such as a threat to life, serious injury, or sexual violence. Some people who experience these types of events may develop PTSD. Sometimes, PTSD can occur in people who hear about trauma that occurs to a close family member or friend. PTSD can happen to anyone at any age. What increases the risk? This condition is more likely to occur in:  Engineer, manufacturing.  People who have been the victims of, or witness to, a traumatic event, such as:  Domestic violence.  Childhood physical or sexual abuse.  Rape.  Natural disasters.  Accidents involving serious injury. What are the signs or symptoms? Symptoms of PTSD can be grouped into several categories: intrusive, avoidance, increased arousal, and negative moods and thoughts. Intrusive Symptoms  This is when a person re-experiences the traumatic event through one or more of the following ways:  Distressing dreams.  Feelings of fear, horror, intense sadness, or anger in response to a reminder of the trauma.  Unwanted distressing memories while awake.  Physical reactions triggered by reminders of the trauma, such as increased heart rate, shortness of breath, sweating, and shaking.  Having flashbacks, or feelings like you are going through the event again. Avoidance Symptoms  This is when a person avoids thoughts, conversations, people, or activities that are reminders of the trauma. Symptoms may also include:  Decreased interest or participation in daily activities.  Loss of connection or avoidance of other people. Increased Arousal Symptoms  This is when a person is more sensitive or reacts more easily to their environment. Symptoms may include:  Being easily startled.  Careless or self-destructive behavior.  Irritability.  Feeling on edge.  Difficulty concentrating.  Verbal  or physical outbursts of anger toward other people or objects.  Difficulty sleeping. Negative Moods or Thoughts  These may include:  Belief that oneself or others are bad.  Regular feelings of fear, horror, anger, sadness, guilt, or shame.  Not being able to remember certain parts of the traumatic event.  Blaming themselves or others for the trauma.  Inability to experience positive emotions, such as happiness or love. PTSD symptoms may start soon after a frightening event or months or years later. Symptoms last at least one month and tend to disrupt relationships, work, and daily activities. How is this diagnosed? PTSD is diagnosed through an assessment by a mental health professional. Adam [name] will be asked questions about any traumatic events. You will also be asked about how these events have changed your thoughts, mood, behavior, and ability to function on a daily basis. You may also be asked if you use alcohol or drugs. How is this treated? Treatment for PTSD may include:  Medicines. Certain medicines can reduce some PTSD symptoms.  Counseling (cognitive behavioral therapy). Talk therapy with a mental health professional who is experienced in treating PTSD can help.  Eye movement desensitization and reprocessing therapy (EMDR). This type of therapy occurs with a specialized therapist. Many people with PTSD benefit from a combination of these treatments. If you have other mental health problems, such as depression, alcohol abuse, or drug addiction, your treatment plan will include treatment for these other conditions. Follow these instructions at home: Lifestyle   Find a support group in your community. Often groups are available for [employer] veterans, trauma victims, and family members or caregivers.  Find ways to relax. This may include:  Breathing exercises.  Meditation.  Yoga.  Listening to quiet music.  Exercise regularly. Try to do at least 30 minutes of physical  activity most days of the week.  Try to get 7-9 hours of sleep each night. To help with sleep:  Keep your bedroom cool and dark.  Do not eat a heavy meal within one hour of bedtime.  Do not drink alcohol or caffeinated drinks before bed.  Avoid screen time, such as television, computers, tablets, or cell phones before bed.  Do not drink alcohol or take illegal drugs.  Look into volunteer opportunities. This can help you feel more connected to your community.  Take steps to help yourself feel safer at home, such as by installing a security system.  Contact a local organization to find out if you are eligible for a service dog.  Keep daily contact with at least one trusted friend or family member.  If your PTSD is affecting your marriage or family, seek help from a family therapist. General instructions   Take over-the-counter and prescription medicines only as told by your health care provider.  Keep all follow-up visits as told by your health care provider and counselor. This is important.  Make sure to let all of your health care providers know you have PTSD. This is especially important if you are having surgery or need to be admitted to the hospital. Contact a health care provider if:  Your symptoms do not get better or get worse. Get help right away if:  You have thoughts of wanting to harm yourself or others. This information is not intended to replace advice given to you by your health care provider. Make sure you discuss any questions you have with your health care provider. Document Released: 06/15/2001 Document Revised: 01/12/2016 Document Reviewed: 09/05/2015 Elsevier Interactive Patient Education  2017 [employer].

## 2017-01-26 NOTE — Progress Notes (Signed)
Pre visit review using our clinic review tool, if applicable. No additional management support is needed unless otherwise documented below in the visit note. 

## 2017-01-28 DIAGNOSIS — M4300 Spondylolysis, site unspecified: Secondary | ICD-10-CM | POA: Diagnosis not present

## 2017-01-28 DIAGNOSIS — M545 Low back pain: Secondary | ICD-10-CM | POA: Diagnosis not present

## 2017-01-28 DIAGNOSIS — M25551 Pain in right hip: Secondary | ICD-10-CM | POA: Diagnosis not present

## 2017-02-09 ENCOUNTER — Encounter: Payer: Self-pay | Admitting: Family Medicine

## 2017-02-09 MED ORDER — GLUCOSE BLOOD VI STRP: ORAL_STRIP | 3 refills | Status: DC

## 2017-02-10 ENCOUNTER — Other Ambulatory Visit: Payer: Self-pay | Admitting: Orthopedic Surgery

## 2017-02-10 ENCOUNTER — Other Ambulatory Visit: Payer: Self-pay | Admitting: Family Medicine

## 2017-02-10 DIAGNOSIS — R1901 Right upper quadrant abdominal swelling, mass and lump: Secondary | ICD-10-CM

## 2017-02-10 DIAGNOSIS — E119 Type 2 diabetes mellitus without complications: Secondary | ICD-10-CM | POA: Diagnosis not present

## 2017-02-11 ENCOUNTER — Ambulatory Visit
Admission: RE | Admit: 2017-02-11 | Discharge: 2017-02-11 | Disposition: A | Discharge status: Home / Self Care | Payer: Medicare Other | Source: Ambulatory Visit | Attending: Orthopedic Surgery | Admitting: Orthopedic Surgery

## 2017-02-11 ENCOUNTER — Other Ambulatory Visit: Payer: Self-pay | Admitting: Orthopedic Surgery

## 2017-02-11 DIAGNOSIS — R1901 Right upper quadrant abdominal swelling, mass and lump: Secondary | ICD-10-CM

## 2017-02-15 ENCOUNTER — Telehealth: Payer: Self-pay | Admitting: *Deleted

## 2017-02-15 NOTE — Telephone Encounter (Signed)
OK 

## 2017-02-15 NOTE — Telephone Encounter (Signed)
[facility] - [address] [place] [id number] [phone]  LORazepam (ATIVAN) 0.5 MG tablet  [place] on Lorazepam.  Last time filled 01/26/17

## 2017-02-16 NOTE — Telephone Encounter (Signed)
Spoke with pharmacy and patient.

## 2017-03-04 ENCOUNTER — Other Ambulatory Visit: Payer: Self-pay | Admitting: Family Medicine

## 2017-03-18 ENCOUNTER — Encounter: Payer: Self-pay | Admitting: Family Medicine

## 2017-03-18 ENCOUNTER — Ambulatory Visit (INDEPENDENT_AMBULATORY_CARE_PROVIDER_SITE_OTHER): Payer: Medicare Other | Admitting: Family Medicine

## 2017-03-18 VITALS — BP 122/80 | HR 87 | Temp 98.7°F | Ht 70.0 in | Wt 260.0 lb

## 2017-03-18 DIAGNOSIS — F4310 Post-traumatic stress disorder, unspecified: Secondary | ICD-10-CM

## 2017-03-18 NOTE — Progress Notes (Signed)
Subjective:     Patient ID: Adam [name], male   DOB: 1950/10/08, 66 y.o.   MRN: [id number]  HPI Patient seen for follow-up regarding chronic anxiety issues. He has history of posttraumatic stress and has had extensive counseling in the past. He came to [place] on chronic benzodiazepine use initially taking up to 6 lorazepam per day and is currently down to 0.5 mg 3 times daily. We added Lexapro 10 mg daily. He does think that his overall stress level seems to be somewhat better. He is early coping better. Still has chronic daily anxiety. We explained last visit other rationale for continuing discomfort back benzos including increasing risk with age, possible cognitive decline associated with this, and concomitant alcohol use.  Only side effect from Lexapro is delayed ejaculation.  Past Medical History:  Diagnosis Date  . Allergy   . Anxiety    history of PTSD following CABG  . Arthritis   . Asthma   . Colitis- colonoscopy 2014 07/13/2015  . COPD (chronic obstructive pulmonary disease) ([place])   . Coronary artery disease    x 6  . Depression   . Family history of polyps in the colon   . GERD (gastroesophageal reflux disease)   . Gout   . H/O atrial fibrillation without current medication    following CABG with no documented episodes since then.  Marland Kitchen Hx of adenomatous colonic polyps 08/12/2010  . Hyperlipidemia   . Hypertension   . Prediabetes   . RLS (restless legs syndrome)   . 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 APPENDECTOMY    . COLONOSCOPY W/ BIOPSIES    . CORONARY ANGIOPLASTY WITH STENT PLACEMENT    . CORONARY ARTERY BYPASS GRAFT    . TOTAL HIP ARTHROPLASTY      reports that he quit smoking about 39 years ago. His smoking use included Cigarettes. He has a 20.00 pack-year smoking history. He has never used smokeless tobacco. He reports that he drinks alcohol. He reports that he does not use drugs. family history  includes Colon cancer in his mother; Heart disease in his paternal grandfather and paternal grandmother. Allergies  Allergen Reactions  . Clindamycin/Lincomycin   . Keflex [Cephalexin]     rash     Review of Systems  Constitutional: Negative for fatigue.  Eyes: Negative for visual disturbance.  Respiratory: Negative for cough, chest tightness and shortness of breath.   Cardiovascular: Negative for chest pain, palpitations and leg swelling.  Neurological: Negative for dizziness, syncope, weakness, light-headedness and headaches.       Objective:   Physical Exam  Constitutional: He is oriented to person, place, and time. He appears well-developed and well-nourished.  HENT:  Right Ear: External ear normal.  Left Ear: External ear normal.  Mouth/Throat: Oropharynx is clear and moist.  Eyes: Pupils are equal, round, and reactive to light.  Neck: Neck supple. No thyromegaly present.  Cardiovascular: Normal rate and regular rhythm.   Pulmonary/Chest: Effort normal and breath sounds normal. No respiratory distress. He has no wheezes. He has no rales.  Musculoskeletal: He exhibits no edema.  Neurological: He is alert and oriented to person, place, and time.       Assessment:     Chronic anxiety with history of post traumatic stress disorder    Plan:     -Continue Lexapro 10 mg daily -We again discussed trying to taper further lorazepam though he is very reluctant. -  Again advised him to reduce alcohol intake -Routine follow-up in 3 months and recheck labs then including A1c  [name] MD [facility]

## 2017-03-21 ENCOUNTER — Ambulatory Visit: Payer: Medicare Other | Attending: Orthopedic Surgery | Admitting: Physical Therapy

## 2017-03-21 ENCOUNTER — Encounter: Payer: Self-pay | Admitting: Physical Therapy

## 2017-03-21 DIAGNOSIS — M62830 Muscle spasm of back: Secondary | ICD-10-CM | POA: Diagnosis not present

## 2017-03-21 DIAGNOSIS — M545 Low back pain, unspecified: Secondary | ICD-10-CM

## 2017-03-21 DIAGNOSIS — M6281 Muscle weakness (generalized): Secondary | ICD-10-CM

## 2017-03-21 NOTE — Therapy (Signed)
[facility] [address] [place], [place], [address] Phone: [phone]   Fax:  [phone]  Physical Therapy Evaluation  Patient Details  Name: Adam [name] MRN: [id number] Date of Birth: 1951/08/12 Referring Provider: Lanae Crumbly  Encounter Date: 03/21/2017      PT End of Session - 03/21/17 1035    Visit Number 1   Number of Visits 10   Date for PT Re-Evaluation 05/16/17   Authorization Type mcare gcodes   PT Start Time 1019   PT Stop Time 1050   PT Time Calculation (min) 31 min   Activity Tolerance Patient tolerated treatment well   Behavior During Therapy [facility] for tasks assessed/performed      Past Medical History:  Diagnosis Date  . Allergy   . Anxiety    history of PTSD following CABG  . Arthritis   . Asthma   . Colitis- colonoscopy 2014 07/13/2015  . COPD (chronic obstructive pulmonary disease) ([place])   . Coronary artery disease    x 6  . Depression   . Family history of polyps in the colon   . GERD (gastroesophageal reflux disease)   . Gout   . H/O atrial fibrillation without current medication    following CABG with no documented episodes since then.  Marland Kitchen Hx of adenomatous colonic polyps 08/12/2010  . Hyperlipidemia   . Hypertension   . Prediabetes   . RLS (restless legs syndrome)   . 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 APPENDECTOMY    . COLONOSCOPY W/ BIOPSIES    . CORONARY ANGIOPLASTY WITH STENT PLACEMENT    . CORONARY ARTERY BYPASS GRAFT    . TOTAL HIP ARTHROPLASTY      There were no vitals filed for this visit.       Subjective Assessment - 03/21/17 1026    Subjective Had an MRI that found a stress fracture in lumbar.  What started this was that I couldn't walk barely at all for 6-7 weeks.  It has gotten a lot better. It was gone and came back about 5 days ago.  My back didn't bother me on my vacation.   Pertinent History COPD, lumbar stress fracture   Limitations Walking;Standing   How long can you walk comfortably? 100 yards   Diagnostic tests MRI   Patient Stated Goals get back to riding bike   Currently in Pain? Yes   Pain Score 5    Pain Location Back   Pain Orientation Mid   Pain Descriptors / Indicators Aching   Pain Type Acute pain   Pain Onset More than a month ago   Pain Frequency Constant   Aggravating Factors  standing and walking   Effect of Pain on Daily Activities riding the stationary bike            [facility] PT Assessment - 03/21/17 0001      Assessment   Medical Diagnosis M43.00; M54.5 lumbar stress fracure   Referring Provider Benjiman Core M   Onset Date/Surgical Date --  6 weeks ago   Prior Therapy No     Precautions   Precautions None     Restrictions   Weight Bearing Restrictions No     Balance Screen   Has the patient fallen in the past 6 months No     [place] residence   Living Arrangements  Spouse/significant other     Prior Function   Level of Independence Independent   Vocation Retired     Associate Professor   Overall Cognitive Status Within Functional Limits for tasks assessed     Observation/Other Assessments   Focus on Therapeutic Outcomes (FOTO)  56% limited     Posture/Postural Control   Posture/Postural Control Postural limitations   Postural Limitations Rounded Shoulders;Anterior pelvic tilt;Increased lumbar lordosis     AROM   AROM Assessment Site Lumbar   Lumbar Flexion 25% limited   Lumbar Extension 50% limited   Lumbar - Right Side Bend 25% limited   Lumbar - Left Side Bend 25% limited     Strength   Strength Assessment Site Hip   Right/Left Hip Right;Left   Right Hip Flexion 4+/5   Right Hip Extension 4+/5   Right Hip External Rotation  4/5   Right Hip Internal Rotation 4/5   Right Hip ABduction 4/5   Right Hip ADduction 4-/5     Palpation   Palpation comment lumbar paraspinals  and glutes tight     Transfers   Five time sit to stand comments  13 sec  wide base of support     Ambulation/Gait   Gait Pattern Decreased stride length;Decreased trunk rotation            Objective measurements completed on examination: See above findings.                    PT Short Term Goals - 03/21/17 2105      PT SHORT TERM GOAL #1   Title independent with initial HEP   Time 4   Period Weeks   Status New     PT SHORT TERM GOAL #2   Title reports 25% less low back pain    Time 4   Period Weeks   Status New     PT SHORT TERM GOAL #3   Title MMT 5/5 for right hip for improved postural strength   Time 4   Period Weeks   Status New           PT Long Term Goals - 03/21/17 2059      PT LONG TERM GOAL #1   Title FOTO < or = to 43% limited     PT LONG TERM GOAL #2   Title able to perform and be independent with advanced  HEP   Time 8   Period Weeks   Status New     PT LONG TERM GOAL #3   Title pt demonstrates improved posture with reduced lumbar lordosis in order to prevent increased pressure on stress fracture   Time 8   Period Weeks   Status New     PT LONG TERM GOAL #4   Title pt reports pain reduced to 2/10 at most during typically aggravating activities   Baseline 7/10   Time 8   Period Weeks   Status New     PT LONG TERM GOAL #5   Title able to use stationary bike with improved posture in order to safely return to cardio routine   Time [place] - 03/21/17 2127    Clinical Impression Statement Patient presents to clinic with chronic low back pain that has recently gotten worse.  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  Patient is experiencing  pain that increases with certain activities such as riding his stationary bike which he was doing regularly.  Pt has posutral deficits, hip and core weakness, and poor body mechanics during transers.  He also demonstrates  muscle spasms of the back.  Pt will benefit from skilled PT to address deficits and return to full function with HEP as part of healthy lifestyle   History and Personal Factors relevant to plan of care: COPD, lumbar stress fracture   Clinical Presentation Stable   Clinical Decision Making Low   Rehab Potential Excellent   Clinical Impairments Affecting Rehab Potential COPD   PT Frequency 2x / week   PT Duration 8 weeks   PT Treatment/Interventions ADLs/Self Care Home Management;Biofeedback;Cryotherapy;Electrical Stimulation;Ultrasound;Traction;Moist Heat;Iontophoresis 87m/ml Dexamethasone;Gait training;Stair training;Functional mobility training;Therapeutic activities;Therapeutic exercise;Patient/family education;Neuromuscular re-education;Balance training;Manual techniques;Taping;Dry needling;Passive range of motion   PT Next Visit Plan review posture, pelvic tilts, lumbar stretches, gentle ROM and core stability   Recommended Other Services n/a   Consulted and Agree with Plan of Care Patient      Patient will benefit from skilled therapeutic intervention in order to improve the following deficits and impairments:  Abnormal gait, Decreased activity tolerance, Decreased endurance, Decreased coordination, Increased muscle spasms, Postural dysfunction, Pain, Improper body mechanics, Decreased range of motion, Difficulty walking, Obesity  Visit Diagnosis: Acute midline low back pain without sciatica - Plan: PT plan of care cert/re-cert  Muscle weakness (generalized) - Plan: PT plan of care cert/re-cert  Muscle spasm of back - Plan: PT plan of care cert/re-cert      G-Codes - [id number]    Functional Assessment Tool Used (Outpatient Only) FOTO and clinical reasoning   Functional Limitation Mobility: Walking and moving around   Mobility: Walking and Moving Around Current Status [phone] At least 40 percent but less than 60 percent impaired, limited or restricted  56% limited; goal 43%  limited   Mobility: Walking and Moving Around Goal Status [phone] At least 40 percent but less than 60 percent impaired, limited or restricted       Problem List Patient Active Problem List   Diagnosis Date Noted  . Chronic cough 03/31/2016  . Allergic rhinitis 08/22/2015  . Colitis/ileitis- colonoscopy 2014, 2017 1October 27, 2016 . Family history of colon cancer in mother deceased age 8612110/27/16 . [place] mellitus, [place] 07/17/2013  . [place] obstructive [place] disease) [place] 07/05/2013  . CAD (coronary artery disease) 06/26/2013  . Essential hypertension, benign 06/26/2013  . Dyslipidemia 06/26/2013  . GERD (gastroesophageal reflux disease) 06/26/2013  . History of atrial fibrillation without current medication 06/26/2013  . Gout 06/26/2013  . Osteoarthritis 06/26/2013  . Asthma, mild persistent 06/26/2013  . Alcohol dependence ([place] 06/26/2013  . PTSD (post-traumatic stress disorder) 06/26/2013  . RLS (restless legs syndrome) 06/26/2013  . HLA B27 (HLA B27 positive) 06/26/2013  . Obesity (BMI 30-39.9) 06/26/2013  . Hx of adenomatous colonic polyps 08/12/2010    JZannie Cove PT 03/21/2017, 9:41 PM  [facility] [address] [place] [address]Phone: [phone]  Fax:  [phone] Name: Adam CowlesMRN: [id number] of Birth: 9Dec 20, 1952

## 2017-03-23 ENCOUNTER — Ambulatory Visit: Payer: Medicare Other | Admitting: Physical Therapy

## 2017-03-23 ENCOUNTER — Encounter: Payer: Self-pay | Admitting: Physical Therapy

## 2017-03-23 DIAGNOSIS — M6281 Muscle weakness (generalized): Secondary | ICD-10-CM

## 2017-03-23 DIAGNOSIS — M545 Low back pain, unspecified: Secondary | ICD-10-CM

## 2017-03-23 DIAGNOSIS — M62830 Muscle spasm of back: Secondary | ICD-10-CM

## 2017-03-23 NOTE — Therapy (Signed)
[facility] [address] [place], [place], [address] Phone: [phone]   Fax:  [phone]  Physical Therapy Treatment  Patient Details  Name: Ulysess Witz MRN: [id number] Date of Birth: August 18, 1951 Referring Provider: Lanae Crumbly  Encounter Date: 03/23/2017      PT End of Session - 03/23/17 1017    Visit Number 2   Number of Visits 10   Date for PT Re-Evaluation 05/16/17   Authorization Type mcare gcodes   PT Start Time 1017   PT Stop Time 1102   PT Time Calculation (min) 45 min   Activity Tolerance Patient tolerated treatment well   Behavior During Therapy [facility] for tasks assessed/performed      Past Medical History:  Diagnosis Date  . Allergy   . Anxiety    history of PTSD following CABG  . Arthritis   . Asthma   . Colitis- colonoscopy 2014 07/13/2015  . COPD (chronic obstructive pulmonary disease) ([place])   . Coronary artery disease    x 6  . Depression   . Family history of polyps in the colon   . GERD (gastroesophageal reflux disease)   . Gout   . H/O atrial fibrillation without current medication    following CABG with no documented episodes since then.  Marland Kitchen Hx of adenomatous colonic polyps 08/12/2010  . Hyperlipidemia   . Hypertension   . Prediabetes   . RLS (restless legs syndrome)   . 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 APPENDECTOMY    . COLONOSCOPY W/ BIOPSIES    . CORONARY ANGIOPLASTY WITH STENT PLACEMENT    . CORONARY ARTERY BYPASS GRAFT    . TOTAL HIP ARTHROPLASTY      There were no vitals filed for this visit.      Subjective Assessment - 03/23/17 1019    Subjective I was very sore after eval last time.  I am feeling better today.   Pertinent History COPD, lumbar stress fracture   Limitations Walking;Standing   Currently in Pain? Yes   Pain Score 4    Pain Location Back   Pain Orientation Mid   Pain  Descriptors / Indicators Aching   Pain Type Acute pain   Pain Onset More than a month ago   Aggravating Factors  standing walking   Pain Relieving Factors rest, aleive, asprin with codein   Effect of Pain on Daily Activities can't exercise                         [facility] Adult PT Treatment/Exercise - 03/23/17 0001      Neuro Re-ed    Neuro Re-ed Details  muscle coordination of activating abdminals with pelvis in neutral     Exercises   Exercises Lumbar     Lumbar Exercises: Stretches   Active Hamstring Stretch 2 reps;30 seconds   ITB Stretch Limitations internal hip rotatoin stretch   Piriformis Stretch 30 seconds;2 reps     Lumbar Exercises: Supine   Ab Set 10 reps  ball squeeze   Glut Set 20 reps  pelvic tilts   Bent Knee Raise 20 reps     Modalities   Modalities Moist Heat;Electrical Stimulation     Moist Heat Therapy   Number Minutes Moist Heat 15 Minutes   Moist Heat Location Lumbar Spine     Electrical Stimulation   Electrical  Stimulation Location lumbar   Electrical Stimulation Action IFC   Electrical Stimulation Parameters to tolerance   Electrical Stimulation Goals Pain                PT Education - 03/23/17 1048    Education provided Yes   Education Details ball squeeze, TrA contract with marching, internal hip rotation stretch, SKTC stretch   Person(s) Educated Patient   Methods Explanation;Verbal cues;Handout   Comprehension Verbalized understanding          PT Short Term Goals - 03/23/17 1047      PT SHORT TERM GOAL #1   Title independent with initial HEP   Time 4   Period Weeks   Status On-going     PT SHORT TERM GOAL #2   Title reports 25% less low back pain    Time 4   Period Weeks   Status On-going     PT SHORT TERM GOAL #3   Title MMT 5/5 for right hip for improved postural strength   Time 4   Period Weeks   Status On-going           PT Long Term Goals - 03/21/17 2059      PT LONG TERM GOAL #1    Title FOTO < or = to 43% limited     PT LONG TERM GOAL #2   Title able to perform and be independent with advanced  HEP   Time 8   Period Weeks   Status New     PT LONG TERM GOAL #3   Title pt demonstrates improved posture with reduced lumbar lordosis in order to prevent increased pressure on stress fracture   Time 8   Period Weeks   Status New     PT LONG TERM GOAL #4   Title pt reports pain reduced to 2/10 at most during typically aggravating activities   Baseline 7/10   Time 8   Period Weeks   Status New     PT LONG TERM GOAL #5   Title able to use stationary bike with improved posture in order to safely return to cardio routine   Time 8   Period Weeks   Status New               Plan - 03/23/17 1017    Clinical Impression Statement Patient did not meet goals due to this only being his first treatment.  Pt demonstrates decreased hip ROM especially internal rotation and hip adductor weakness.  Had muscle tiredness with just staying in hook lying position.  Pt continues to need skilled PT for improved core and LE strength to reduce risk of furth injury in low back.   PT Treatment/Interventions ADLs/Self Care Home Management;Biofeedback;Cryotherapy;Electrical Stimulation;Ultrasound;Traction;Moist Heat;Iontophoresis 26m/ml Dexamethasone;Gait training;Stair training;Functional mobility training;Therapeutic activities;Therapeutic exercise;Patient/family education;Neuromuscular re-education;Balance training;Manual techniques;Taping;Dry needling;Passive range of motion   PT Next Visit Plan hip aDduction, hip and core strength, hip ROM, lumbar flexion   Consulted and Agree with Plan of Care Patient      Patient will benefit from skilled therapeutic intervention in order to improve the following deficits and impairments:  Abnormal gait, Decreased activity tolerance, Decreased endurance, Decreased coordination, Increased muscle spasms, Postural dysfunction, Pain, Improper body  mechanics, Decreased range of motion, Difficulty walking, Obesity  Visit Diagnosis: Acute midline low back pain without sciatica  Muscle weakness (generalized)  Muscle spasm of back     Problem List Patient Active Problem List   Diagnosis Date Noted  .  Chronic cough 03/31/2016  . Allergic rhinitis 08/22/2015  . Colitis/ileitis- colonoscopy 2014, 2017 08/04/2015  . Family history of colon cancer in mother deceased age 90 08/04/2015  . Type 2 diabetes mellitus, controlled ([place]) 07/17/2013  . COPD (chronic obstructive pulmonary disease) ([place]) 07/05/2013  . CAD (coronary artery disease) 06/26/2013  . Essential hypertension, benign 06/26/2013  . Dyslipidemia 06/26/2013  . GERD (gastroesophageal reflux disease) 06/26/2013  . History of atrial fibrillation without current medication 06/26/2013  . Gout 06/26/2013  . Osteoarthritis 06/26/2013  . Asthma, mild persistent 06/26/2013  . Alcohol dependence ([place]) 06/26/2013  . PTSD (post-traumatic stress disorder) 06/26/2013  . RLS (restless legs syndrome) 06/26/2013  . HLA B27 (HLA B27 positive) 06/26/2013  . Obesity (BMI 30-39.9) 06/26/2013  . Hx of adenomatous colonic polyps 08/12/2010    Zannie Cove, PT 03/23/2017, 11:10 AM  [facility] [address], [place], [place], [address] Phone: [phone]   Fax:  [phone]  Name: Mackenzy Eisenberg MRN: [id number] Date of Birth: 1951-01-29

## 2017-03-23 NOTE — Patient Instructions (Addendum)
   HIP ADDUCTION SQUEEZE - SUPINE  Place a rolled up towel, ball or pillow between your knees and press your knees together so that you squeeze the object firmly.  Gently contract lower abdominals Hold 5 sec and then release and repeat 10 times    Transverse Abdominus Activation  Contract your lower abdominals as if you were trying to lift one leg from the table.  Initiate the movement but do no lift foot greater than 1 inch from the table.   Hold 3 sec Repeat opposite side. - 10x each side    Hip Internal Rotation Stretch  Lay on your back with feet spread wide. Lower one knee down, keeping it straight in line with your hip bone.  You are looking for a stretch in your hip. Be sure to not let your pelvis come up as your leg goes down.  Hold 20 sec and repeat 3x each side  Knee-to-Chest Stretch: Unilateral    With hand behind right knee, pull knee in to chest until a comfortable stretch is felt in lower back and buttocks. Keep back relaxed. Hold __10__ seconds. Repeat __5__ times per set. Do ___1_ sets per session. Do __1__ sessions per day.  http://orth.exer.us/126   Copyright  VHI. All rights reserved.

## 2017-03-24 ENCOUNTER — Encounter: Payer: Self-pay | Admitting: Family Medicine

## 2017-03-25 MED ORDER — LORAZEPAM 0.5 MG PO TABS: 0.5000 mg | ORAL_TABLET | Freq: Three times a day (TID) | ORAL | 1 refills | Status: DC | PRN

## 2017-03-28 ENCOUNTER — Ambulatory Visit: Payer: Medicare Other | Admitting: Physical Therapy

## 2017-03-28 DIAGNOSIS — M6281 Muscle weakness (generalized): Secondary | ICD-10-CM

## 2017-03-28 DIAGNOSIS — M62830 Muscle spasm of back: Secondary | ICD-10-CM | POA: Diagnosis not present

## 2017-03-28 DIAGNOSIS — M545 Low back pain, unspecified: Secondary | ICD-10-CM

## 2017-03-28 NOTE — Therapy (Addendum)
[facility] [address], [place], [place], [address] Phone: [phone]   Fax:  [phone]  Physical Therapy Treatment  Patient Details  Name: Adam [name] MRN: [id number] Date of Birth: 23-Jan-1951 Referring Provider: Lanae Crumbly  Encounter Date: 03/28/2017      PT End of Session - 03/28/17 1019    Visit Number 3   Number of Visits 10   Date for PT Re-Evaluation 05/16/17   Authorization Type mcare gcodes   PT Start Time 1012   PT Stop Time 1111   PT Time Calculation (min) 59 min   Activity Tolerance Patient tolerated treatment well   Behavior During Therapy [facility] for tasks assessed/performed      Past Medical History:  Diagnosis Date  . Allergy   . Anxiety    history of PTSD following CABG  . Arthritis   . Asthma   . Colitis- colonoscopy 2014 07/13/2015  . COPD (chronic obstructive pulmonary disease) ([place])   . Coronary artery disease    x 6  . Depression   . Family history of polyps in the colon   . GERD (gastroesophageal reflux disease)   . Gout   . H/O atrial fibrillation without current medication    following CABG with no documented episodes since then.  Marland Kitchen Hx of adenomatous colonic polyps 08/12/2010  . Hyperlipidemia   . Hypertension   . Prediabetes   . RLS (restless legs syndrome)   . 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 APPENDECTOMY    . COLONOSCOPY W/ BIOPSIES    . CORONARY ANGIOPLASTY WITH STENT PLACEMENT    . CORONARY ARTERY BYPASS GRAFT    . TOTAL HIP ARTHROPLASTY      There were no vitals filed for this visit.      Subjective Assessment - 03/28/17 1016    Subjective I was able to help someone move some heavy things and I was okay.  Denies pain.     Pertinent History COPD, lumbar stress fracture   Limitations Walking;Standing   How long can you walk comfortably? 100 yards   Diagnostic tests MRI   Patient  Stated Goals get back to riding bike   Currently in Pain? No/denies                         [facility] Adult PT Treatment/Exercise - 03/28/17 0001      Lumbar Exercises: Machines for Strengthening   Leg Press bilateral 70# 30x  need cues not to lock out knee     Lumbar Exercises: Standing   Row Strengthening;20 reps;Theraband   Theraband Level (Row) Level 3 (Green)   Row Limitations starts feeling lumbar paraspinals activate   Shoulder Extension Strengthening;20 reps;Theraband   Theraband Level (Shoulder Extension) Level 1 (Yellow)     Lumbar Exercises: Seated   Long Arc Quad on Chair Strengthening;Right;20 reps;Weights   LAQ on Chair Weights (lbs) 4   Sit to Stand 10 reps  cues to contract abdominals     Lumbar Exercises: Supine   Ab Set 10 reps  ball squeeze   Glut Set 20 reps  pelvic tilts   Bent Knee Raise 20 reps     Knee/Hip Exercises: Standing   Hip Abduction Stengthening;Right;Left;15 reps;Knee straight  unable to stand on right without locking knee   Abduction Limitations red band     Moist Heat  Therapy   Number Minutes Moist Heat 15 Minutes   Moist Heat Location Lumbar Spine     Electrical Stimulation   Electrical Stimulation Location lumbar   Electrical Stimulation Action iFC   Electrical Stimulation Parameters to tolerance   Electrical Stimulation Goals Pain     Manual Therapy   Manual Therapy Soft tissue mobilization   Soft tissue mobilization left glutes                  PT Short Term Goals - 03/28/17 1024      PT SHORT TERM GOAL #1   Title independent with initial HEP   Time 4   Period Weeks   Status Achieved     PT SHORT TERM GOAL #2   Title reports 25% less low back pain    Time 4   Period Weeks   Status On-going     PT SHORT TERM GOAL #3   Title MMT 5/5 for right hip for improved postural strength   Time 4   Period Weeks   Status On-going           PT Long Term Goals - 03/21/17 2059      PT LONG TERM  GOAL #1   Title FOTO < or = to 43% limited     PT LONG TERM GOAL #2   Title able to perform and be independent with advanced  HEP   Time 8   Period Weeks   Status New     PT LONG TERM GOAL #3   Title pt demonstrates improved posture with reduced lumbar lordosis in order to prevent increased pressure on stress fracture   Time 8   Period Weeks   Status New     PT LONG TERM GOAL #4   Title pt reports pain reduced to 2/10 at most during typically aggravating activities   Baseline 7/10   Time 8   Period Weeks   Status New     PT LONG TERM GOAL #5   Title able to use stationary bike with improved posture in order to safely return to cardio routine   Time 8   Period Weeks   Status New               Plan - 03/28/17 1020    Clinical Impression Statement Patient has a lot of muscle fatigue right>left LE during exercises with visible trembling and difficulty sustaining the contraction.     Rehab Potential Excellent   Clinical Impairments Affecting Rehab Potential COPD   PT Treatment/Interventions ADLs/Self Care Home Management;Biofeedback;Cryotherapy;Electrical Stimulation;Ultrasound;Traction;Moist Heat;Iontophoresis 38m/ml Dexamethasone;Gait training;Stair training;Functional mobility training;Therapeutic activities;Therapeutic exercise;Patient/family education;Neuromuscular re-education;Balance training;Manual techniques;Taping;Dry needling;Passive range of motion   PT Next Visit Plan hip aDduction, hip and core strength, right quad strength   Recommended Other Services add clams and band exercises, LAQ on right LE, info on TENS 7000 vs EMSI   Consulted and Agree with Plan of Care Patient      Patient will benefit from skilled therapeutic intervention in order to improve the following deficits and impairments:  Abnormal gait, Decreased activity tolerance, Decreased endurance, Decreased coordination, Increased muscle spasms, Postural dysfunction, Pain, Improper body mechanics,  Decreased range of motion, Difficulty walking, Obesity  Visit Diagnosis: Acute midline low back pain without sciatica  Muscle weakness (generalized)  Muscle spasm of back     Problem List Patient Active Problem List   Diagnosis Date Noted  . Chronic cough 03/31/2016  . Allergic rhinitis 08/22/2015  .  Colitis/ileitis- colonoscopy 2014, 2017 08/08/15  . Family history of colon cancer in mother deceased age 20 08/08/15  . Type 2 diabetes mellitus, controlled ([place]) 07/17/2013  . COPD (chronic obstructive pulmonary disease) ([place]) 07/05/2013  . CAD (coronary artery disease) 06/26/2013  . Essential hypertension, benign 06/26/2013  . Dyslipidemia 06/26/2013  . GERD (gastroesophageal reflux disease) 06/26/2013  . History of atrial fibrillation without current medication 06/26/2013  . Gout 06/26/2013  . Osteoarthritis 06/26/2013  . Asthma, mild persistent 06/26/2013  . Alcohol dependence ([place]) 06/26/2013  . PTSD (post-traumatic stress disorder) 06/26/2013  . RLS (restless legs syndrome) 06/26/2013  . HLA B27 (HLA B27 positive) 06/26/2013  . Obesity (BMI 30-39.9) 06/26/2013  . Hx of adenomatous colonic polyps 08/12/2010    Zannie Cove, PT 03/28/2017, 11:12 AM  [facility] [address], [place], [place], [address] Phone: [phone]   Fax:  [phone]  Name: Adam [name] MRN: [id number] Date of Birth: Feb 27, 1951

## 2017-03-29 DIAGNOSIS — E119 Type 2 diabetes mellitus without complications: Secondary | ICD-10-CM | POA: Diagnosis not present

## 2017-03-30 ENCOUNTER — Ambulatory Visit: Payer: Medicare Other | Admitting: Physical Therapy

## 2017-03-30 ENCOUNTER — Encounter: Payer: Self-pay | Admitting: Physical Therapy

## 2017-03-30 DIAGNOSIS — M62830 Muscle spasm of back: Secondary | ICD-10-CM

## 2017-03-30 DIAGNOSIS — M545 Low back pain, unspecified: Secondary | ICD-10-CM

## 2017-03-30 DIAGNOSIS — M6281 Muscle weakness (generalized): Secondary | ICD-10-CM

## 2017-03-30 NOTE — Therapy (Signed)
[facility] [address] [place], [place], [address] Phone: [phone]   Fax:  [phone]  Physical Therapy Treatment  Patient Details  Name: Adam [name] MRN: [id number] Date of Birth: 12-29-1950 Referring Provider: Lanae Crumbly  Encounter Date: 03/30/2017      PT End of Session - 03/30/17 1135    Visit Number 4   Number of Visits 10   Date for PT Re-Evaluation 05/16/17   Authorization Type mcare gcodes   PT Start Time 1100   PT Stop Time 1150   PT Time Calculation (min) 50 min   Activity Tolerance Patient tolerated treatment well   Behavior During Therapy [facility] for tasks assessed/performed      Past Medical History:  Diagnosis Date  . Allergy   . Anxiety    history of PTSD following CABG  . Arthritis   . Asthma   . Colitis- colonoscopy 2014 07/13/2015  . COPD (chronic obstructive pulmonary disease) ([place])   . Coronary artery disease    x 6  . Depression   . Family history of polyps in the colon   . GERD (gastroesophageal reflux disease)   . Gout   . H/O atrial fibrillation without current medication    following CABG with no documented episodes since then.  Marland Kitchen Hx of adenomatous colonic polyps 08/12/2010  . Hyperlipidemia   . Hypertension   . Prediabetes   . RLS (restless legs syndrome)   . 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 APPENDECTOMY    . COLONOSCOPY W/ BIOPSIES    . CORONARY ANGIOPLASTY WITH STENT PLACEMENT    . CORONARY ARTERY BYPASS GRAFT    . TOTAL HIP ARTHROPLASTY      There were no vitals filed for this visit.      Subjective Assessment - 03/30/17 1102    Subjective I feel "much improved"   Pertinent History COPD, lumbar stress fracture   Patient Stated Goals get back to riding bike   Currently in Pain? No/denies                         [facility] Adult PT Treatment/Exercise - 03/30/17 0001       Lumbar Exercises: Machines for Strengthening   Leg Press bilateral 80# x 30  cues to not lock out knees     Lumbar Exercises: Standing   Row Strengthening;Both;20 reps   Theraband Level (Row) Level 3 (Green)   Shoulder Extension Strengthening;Both;20 reps   Theraband Level (Shoulder Extension) Level 3 (Green)   Other Standing Lumbar Exercises standing hip abd and add with red band 2 x 10  cues to not lock knee on stance leg     Lumbar Exercises: Supine   Ab Set 20 reps  ball squeeze   Glut Set 20 reps  pelvic tilt   Bent Knee Raise 20 reps     Lumbar Exercises: Sidelying   Clam 20 reps  bilat     Modalities   Modalities Electrical Stimulation;Moist Heat     Moist Heat Therapy   Number Minutes Moist Heat 15 Minutes   Moist Heat Location Lumbar Spine     Electrical Stimulation   Electrical Stimulation Location lumbar   Electrical Stimulation Action IFC   Electrical Stimulation Parameters to tolerance   Electrical Stimulation Goals Pain  PT Short Term Goals - 03/30/17 1136      PT SHORT TERM GOAL #1   Title independent with initial HEP   Status Achieved     PT SHORT TERM GOAL #2   Title reports 25% less low back pain    Status Achieved     PT SHORT TERM GOAL #3   Title MMT 5/5 for right hip for improved postural strength   Status On-going           PT Long Term Goals - 03/30/17 1137      PT LONG TERM GOAL #1   Title FOTO < or = to 43% limited   Status On-going     PT LONG TERM GOAL #2   Title able to perform and be independent with advanced  HEP   Status On-going     PT LONG TERM GOAL #3   Title pt demonstrates improved posture with reduced lumbar lordosis in order to prevent increased pressure on stress fracture   Status On-going     PT LONG TERM GOAL #4   Title pt reports pain reduced to 2/10 at most during typically aggravating activities   Status On-going     PT LONG TERM GOAL #5   Title able to use  stationary bike with improved posture in order to safely return to cardio routine   Status On-going               Plan - 03/30/17 1135    Clinical Impression Statement Pt continues with decreased LE strength in adductors and quads, will continue to benefit from strengthening. pt states he plans to buy TENS unit and will bring it in   Rehab Potential Excellent   Clinical Impairments Affecting Rehab Potential COPD   PT Frequency 2x / week   PT Duration 8 weeks   PT Treatment/Interventions ADLs/Self Care Home Management;Biofeedback;Cryotherapy;Electrical Stimulation;Ultrasound;Traction;Moist Heat;Iontophoresis 39m/ml Dexamethasone;Gait training;Stair training;Functional mobility training;Therapeutic activities;Therapeutic exercise;Patient/family education;Neuromuscular re-education;Balance training;Manual techniques;Taping;Dry needling;Passive range of motion   PT Next Visit Plan continue hip and core strength      Patient will benefit from skilled therapeutic intervention in order to improve the following deficits and impairments:  Abnormal gait, Decreased activity tolerance, Decreased endurance, Decreased coordination, Increased muscle spasms, Postural dysfunction, Pain, Improper body mechanics, Decreased range of motion, Difficulty walking, Obesity  Visit Diagnosis: Acute midline low back pain without sciatica  Muscle weakness (generalized)  Muscle spasm of back     Problem List Patient Active Problem List   Diagnosis Date Noted  . Chronic cough 03/31/2016  . Allergic rhinitis 08/22/2015  . Colitis/ileitis- colonoscopy 2014, 2017 111-10-2014 . Family history of colon cancer in mother deceased age 4465111/01/16 . Type 2 diabetes mellitus, controlled ([place] 07/17/2013  . COPD [place] obstructive [place] disease) [place] 07/05/2013  . CAD (coronary artery disease) 06/26/2013  . Essential hypertension, benign 06/26/2013  . Dyslipidemia 06/26/2013  . GERD (gastroesophageal  reflux disease) 06/26/2013  . History of atrial fibrillation without current medication 06/26/2013  . Gout 06/26/2013  . Osteoarthritis 06/26/2013  . Asthma, mild persistent 06/26/2013  . Alcohol dependence ([place] 06/26/2013  . PTSD (post-traumatic stress disorder) 06/26/2013  . RLS (restless legs syndrome) 06/26/2013  . HLA B27 (HLA B27 positive) 06/26/2013  . Obesity (BMI 30-39.9) 06/26/2013  . Hx of adenomatous colonic polyps 08/12/2010    KIsabelle Course PT,DPT 03/30/2017, 11:38 AM  [facility] [address] [place] [address]: [phone]  Fax:  [phone]  Name: Adam [name] MRN: [id number] Date of Birth: 1951-07-10

## 2017-04-04 ENCOUNTER — Ambulatory Visit: Payer: Medicare Other | Attending: Orthopedic Surgery | Admitting: Physical Therapy

## 2017-04-04 DIAGNOSIS — M6281 Muscle weakness (generalized): Secondary | ICD-10-CM | POA: Diagnosis not present

## 2017-04-04 DIAGNOSIS — M545 Low back pain, unspecified: Secondary | ICD-10-CM

## 2017-04-04 DIAGNOSIS — M62830 Muscle spasm of back: Secondary | ICD-10-CM

## 2017-04-04 NOTE — Therapy (Signed)
[facility] [address], [place], [place], [address] Phone: [phone]   Fax:  [phone]  Physical Therapy Treatment  Patient Details  Name: Adam [name] MRN: [id number] Date of Birth: March 26, 1951 Referring Provider: Lanae Crumbly  Encounter Date: 04/04/2017      PT End of Session - 04/04/17 1237    Visit Number 5   Number of Visits 10   Date for PT Re-Evaluation 05/16/17   Authorization Type mcare gcodes   PT Start Time 6384   PT Stop Time 1315   PT Time Calculation (min) 40 min   Activity Tolerance Patient tolerated treatment well   Behavior During Therapy [facility] for tasks assessed/performed      Past Medical History:  Diagnosis Date  . Allergy   . Anxiety    history of PTSD following CABG  . Arthritis   . Asthma   . Colitis- colonoscopy 2014 07/13/2015  . COPD (chronic obstructive pulmonary disease) ([place])   . Coronary artery disease    x 6  . Depression   . Family history of polyps in the colon   . GERD (gastroesophageal reflux disease)   . Gout   . H/O atrial fibrillation without current medication    following CABG with no documented episodes since then.  Marland Kitchen Hx of adenomatous colonic polyps 08/12/2010  . Hyperlipidemia   . Hypertension   . Prediabetes   . RLS (restless legs syndrome)   . 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 APPENDECTOMY    . COLONOSCOPY W/ BIOPSIES    . CORONARY ANGIOPLASTY WITH STENT PLACEMENT    . CORONARY ARTERY BYPASS GRAFT    . TOTAL HIP ARTHROPLASTY      There were no vitals filed for this visit.      Subjective Assessment - 04/04/17 1240    Subjective I feel better for the most part.  I guess we're doing something right.  I will probably try to ride the bike in a few weeks but I can't because it's too hot right now.   Pertinent History COPD, lumbar stress fracture   Limitations Walking;Standing    How long can you walk comfortably? 100 yards   Diagnostic tests MRI   Patient Stated Goals get back to riding bike   Currently in Pain? No/denies                         [facility] Adult PT Treatment/Exercise - 04/04/17 0001      Lumbar Exercises: Machines for Strengthening   Leg Press bilateral 85# x 30  cues to not lock out knees     Lumbar Exercises: Standing   Other Standing Lumbar Exercises standing isometric UE extension for abdominal bracing     Lumbar Exercises: Supine   Ab Set --  ball squeeze   Bent Knee Raise 20 reps     Lumbar Exercises: Sidelying   Clam 20 reps  reverse clam     Knee/Hip Exercises: Standing   Hip ADduction Strengthening;Right;Left;15 reps   Hip ADduction Limitations red band   Hip Abduction Stengthening;Right;Left;15 reps;Knee straight  unable to stand on right without locking knee   Abduction Limitations red band   Functional Squat 20 reps  half squat cues for alignment     Electrical Stimulation   Electrical Stimulation Location lumbar   Electrical Stimulation Action IFC  Electrical Stimulation Parameters to tolerance x 15 min   Electrical Stimulation Goals Pain                  PT Short Term Goals - 03/30/17 1136      PT SHORT TERM GOAL #1   Title independent with initial HEP   Status Achieved     PT SHORT TERM GOAL #2   Title reports 25% less low back pain    Status Achieved     PT SHORT TERM GOAL #3   Title MMT 5/5 for right hip for improved postural strength   Status On-going           PT Long Term Goals - 04/04/17 1242      PT LONG TERM GOAL #1   Title FOTO < or = to 43% limited   Time 8   Period Weeks   Status On-going     PT LONG TERM GOAL #2   Title able to perform and be independent with advanced  HEP   Time 8   Period Weeks   Status On-going     PT LONG TERM GOAL #3   Title pt demonstrates improved posture with reduced lumbar lordosis in order to prevent increased pressure on  stress fracture   Time 8   Period Weeks   Status On-going     PT LONG TERM GOAL #4   Title pt reports pain reduced to 2/10 at most during typically aggravating activities   Baseline 3/10   Time 8   Period Weeks   Status On-going     PT LONG TERM GOAL #5   Title able to use stationary bike with improved posture in order to safely return to cardio routine   Time 8   Period Weeks   Status On-going               Plan - 04/04/17 1237    Clinical Impression Statement Patient continues to demonstrate decreased strength and reports he still has pain when lifting his dog and heavy items around the house.  Pt performed and educated on correct squatting technique for good alignment.  Pt continues to have poor core and hip/LE strength and needs skilled PT.   Rehab Potential Excellent   Clinical Impairments Affecting Rehab Potential COPD   PT Treatment/Interventions ADLs/Self Care Home Management;Biofeedback;Cryotherapy;Electrical Stimulation;Ultrasound;Traction;Moist Heat;Iontophoresis 69m/ml Dexamethasone;Gait training;Stair training;Functional mobility training;Therapeutic activities;Therapeutic exercise;Patient/family education;Neuromuscular re-education;Balance training;Manual techniques;Taping;Dry needling;Passive range of motion   PT Next Visit Plan continue hip and core strength   Consulted and Agree with Plan of Care Patient      Patient will benefit from skilled therapeutic intervention in order to improve the following deficits and impairments:  Abnormal gait, Decreased activity tolerance, Decreased endurance, Decreased coordination, Increased muscle spasms, Postural dysfunction, Pain, Improper body mechanics, Decreased range of motion, Difficulty walking, Obesity  Visit Diagnosis: Acute midline low back pain without sciatica  Muscle weakness (generalized)  Muscle spasm of back     Problem List Patient Active Problem List   Diagnosis Date Noted  . Chronic cough  03/31/2016  . Allergic rhinitis 08/22/2015  . Colitis/ileitis- colonoscopy 2014, 2017 1Nov 07, 2016 . Family history of colon cancer in mother deceased age 548912016/11/07 . Type 2 diabetes mellitus, controlled ([place] 07/17/2013  . COPD (chronic obstructive pulmonary disease) ([place] 07/05/2013  . CAD (coronary artery disease) 06/26/2013  . Essential hypertension, benign 06/26/2013  . Dyslipidemia 06/26/2013  . GERD (gastroesophageal reflux disease) 06/26/2013  .  History of atrial fibrillation without current medication 06/26/2013  . Gout 06/26/2013  . Osteoarthritis 06/26/2013  . Asthma, mild persistent 06/26/2013  . Alcohol dependence ([place]) 06/26/2013  . PTSD (post-traumatic stress disorder) 06/26/2013  . RLS (restless legs syndrome) 06/26/2013  . HLA B27 (HLA B27 positive) 06/26/2013  . Obesity (BMI 30-39.9) 06/26/2013  . Hx of adenomatous colonic polyps 08/12/2010    Zannie Cove, PT 04/04/2017, 1:11 PM  [facility] [address], [place], [place], [address] Phone: [phone]   Fax:  [phone]  Name: Quaran Kedzierski MRN: [id number] Date of Birth: 1951-06-29

## 2017-04-07 ENCOUNTER — Ambulatory Visit: Payer: Medicare Other

## 2017-04-07 DIAGNOSIS — M62830 Muscle spasm of back: Secondary | ICD-10-CM

## 2017-04-07 DIAGNOSIS — M545 Low back pain, unspecified: Secondary | ICD-10-CM

## 2017-04-07 DIAGNOSIS — M6281 Muscle weakness (generalized): Secondary | ICD-10-CM

## 2017-04-07 NOTE — Therapy (Signed)
[facility] [address] [place], [place], [address] Phone: [phone]   Fax:  [phone]  Physical Therapy Treatment  Patient Details  Name: Adam [name] MRN: [id number] Date of Birth: November 01, 1950 Referring Provider: Lanae [name]  Encounter Date: 04/07/2017      PT End of Session - 04/07/17 1307    Visit Number 6   Number of Visits 10   Date for PT Re-Evaluation 05/16/17   Authorization Type mcare gcodes   PT Start Time 1230   PT Stop Time 1313   PT Time Calculation (min) 43 min   Activity Tolerance Patient tolerated treatment well   Behavior During Therapy [facility] for tasks assessed/performed      Past Medical History:  Diagnosis Date  . Allergy   . Anxiety    history of PTSD following CABG  . Arthritis   . Asthma   . Colitis- colonoscopy 2014 07/13/2015  . COPD (chronic obstructive pulmonary disease) ([place])   . Coronary artery disease    x 6  . Depression   . Family history of polyps in the colon   . GERD (gastroesophageal reflux disease)   . Gout   . H/O atrial fibrillation without current medication    following CABG with no documented episodes since then.  Marland Kitchen Hx of adenomatous colonic polyps 08/12/2010  . Hyperlipidemia   . Hypertension   . Prediabetes   . RLS (restless legs syndrome)   . 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 APPENDECTOMY    . COLONOSCOPY W/ BIOPSIES    . CORONARY ANGIOPLASTY WITH STENT PLACEMENT    . CORONARY ARTERY BYPASS GRAFT    . TOTAL HIP ARTHROPLASTY      There were no vitals filed for this visit.      Subjective Assessment - 04/07/17 1242    Subjective I want to get back to riding my bike.     Currently in Pain? No/denies                         [facility] Adult PT Treatment/Exercise - 04/07/17 0001      Exercises   Exercises Knee/Hip     Lumbar Exercises: Aerobic   UBE (Upper  Arm Bike) Level 1 x 6 minutes (3/3) seated on ball     Lumbar Exercises: Machines for Strengthening   Leg Press bilateral 85# x 30  cues to not lock out knees     Lumbar Exercises: Standing   Row Power tower;Strengthening;Both;20 reps   Row Limitations 20#   Shoulder Extension Strengthening;Power Tower;Both;20 reps   Shoulder Extension Limitations 20#     Lumbar Exercises: Seated   Sit to Stand Limitations seated on blue ball: 1# weights 3 way raises x 10 each     Lumbar Exercises: Supine   Ab Set --  ball squeeze   Bent Knee Raise 20 reps     Lumbar Exercises: Sidelying   Clam 20 reps  reverse clam     Knee/Hip Exercises: Aerobic   Nustep Level 3x 10 minutes  PT present to discuss progress                  PT Short Term Goals - 04/07/17 1243      PT SHORT TERM GOAL #3   Title MMT 5/5 for right hip for improved postural strength  Time 4   Period Weeks   Status On-going           PT Long Term Goals - 04/07/17 1243      PT LONG TERM GOAL #1   Title FOTO < or = to 43% limited   Time 8   Period Weeks   Status On-going     PT LONG TERM GOAL #2   Title able to perform and be independent with advanced  HEP   Time 8   Period Weeks   Status On-going     PT LONG TERM GOAL #4   Title pt reports pain reduced to 2/10 at most during typically aggravating activities   Time 8   Period Weeks   Status On-going               Plan - 04/07/17 1246    Clinical Impression Statement Pt with pain only with lifting his dog into the car.  Pt continues to demonstrae decreased strength and endurance.  Pt requires verbal cues for techinque in the clinic today.  Pt will continue to benefit from skilled PT for LE strength, endurance and core strength.     PT Frequency 2x / week   PT Duration 8 weeks   PT Treatment/Interventions ADLs/Self Care Home Management;Biofeedback;Cryotherapy;Electrical Stimulation;Ultrasound;Traction;Moist Heat;Iontophoresis 87m/ml  Dexamethasone;Gait training;Stair training;Functional mobility training;Therapeutic activities;Therapeutic exercise;Patient/family education;Neuromuscular re-education;Balance training;Manual techniques;Taping;Dry needling;Passive range of motion   PT Next Visit Plan continue hip and core strength   Consulted and Agree with Plan of Care Patient      Patient will benefit from skilled therapeutic intervention in order to improve the following deficits and impairments:  Abnormal gait, Decreased activity tolerance, Decreased endurance, Decreased coordination, Increased muscle spasms, Postural dysfunction, Pain, Improper body mechanics, Decreased range of motion, Difficulty walking, Obesity  Visit Diagnosis: Acute midline low back pain without sciatica  Muscle weakness (generalized)  Muscle spasm of back     Problem List Patient Active Problem List   Diagnosis Date Noted  . Chronic cough 03/31/2016  . Allergic rhinitis 08/22/2015  . Colitis/ileitis- colonoscopy 2014, 2017 111-02-2015 . Family history of colon cancer in mother deceased age 634412016/11/05 . Type 2 diabetes mellitus, controlled ([facility] 07/17/2013  . COPD (chronic obstructive pulmonary disease) ([place] 07/05/2013  . CAD (coronary artery disease) 06/26/2013  . Essential hypertension, benign 06/26/2013  . Dyslipidemia 06/26/2013  . GERD (gastroesophageal reflux disease) 06/26/2013  . History of atrial fibrillation without current medication 06/26/2013  . Gout 06/26/2013  . Osteoarthritis 06/26/2013  . Asthma, mild persistent 06/26/2013  . Alcohol dependence ([place] 06/26/2013  . PTSD (post-traumatic stress disorder) 06/26/2013  . RLS (restless legs syndrome) 06/26/2013  . HLA B27 (HLA B27 positive) 06/26/2013  . Obesity (BMI 30-39.9) 06/26/2013  . Hx of adenomatous colonic polyps 08/12/2010     KSigurd Sos PT 04/07/17 1:10 PM  [facility] [address] [place] [address]Phone: [phone]  Fax:  [phone] Name: Adam LejaMRN: [id number] of Birth: [birth year]

## 2017-04-12 ENCOUNTER — Ambulatory Visit: Payer: Medicare Other

## 2017-04-12 DIAGNOSIS — M545 Low back pain, unspecified: Secondary | ICD-10-CM

## 2017-04-12 DIAGNOSIS — M62830 Muscle spasm of back: Secondary | ICD-10-CM

## 2017-04-12 DIAGNOSIS — M6281 Muscle weakness (generalized): Secondary | ICD-10-CM

## 2017-04-12 NOTE — Therapy (Signed)
[facility] [address] [place], [place], [address] Phone: [phone]   Fax:  [phone]  Physical Therapy Treatment  Patient Details  Name: Adam [name] MRN: [id number] Date of Birth: 04/01/1951 Referring Provider: Lanae Crumbly  Encounter Date: 04/12/2017      PT End of Session - 04/12/17 1110    Visit Number 7   Number of Visits 10   Date for PT Re-Evaluation 05/16/17   Authorization Type mcare gcodes   PT Start Time 1105   PT Stop Time 1146   PT Time Calculation (min) 41 min   Activity Tolerance Patient tolerated treatment well   Behavior During Therapy [facility] for tasks assessed/performed      Past Medical History:  Diagnosis Date  . Allergy   . Anxiety    history of PTSD following CABG  . Arthritis   . Asthma   . Colitis- colonoscopy 2014 07/13/2015  . COPD (chronic obstructive pulmonary disease) ([place])   . Coronary artery disease    x 6  . Depression   . Family history of polyps in the colon   . GERD (gastroesophageal reflux disease)   . Gout   . H/O atrial fibrillation without current medication    following CABG with no documented episodes since then.  Marland Kitchen Hx of adenomatous colonic polyps 08/12/2010  . Hyperlipidemia   . Hypertension   . Prediabetes   . RLS (restless legs syndrome)   . 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 APPENDECTOMY    . COLONOSCOPY W/ BIOPSIES    . CORONARY ANGIOPLASTY WITH STENT PLACEMENT    . CORONARY ARTERY BYPASS GRAFT    . TOTAL HIP ARTHROPLASTY      There were no vitals filed for this visit.      Subjective Assessment - 04/12/17 1108    Subjective Pt. noting mild muscular soreness following last treatment which subsided next day.     Patient Stated Goals get back to riding bike   Currently in Pain? No/denies   Pain Score 0-No pain   Multiple Pain Sites No                          [facility] Adult PT Treatment/Exercise - 04/12/17 1123      Lumbar Exercises: Stretches   Passive Hamstring Stretch 2 reps;30 seconds   Passive Hamstring Stretch Limitations with strap   Single Knee to Chest Stretch 2 reps;30 seconds     Lumbar Exercises: Standing   Other Standing Lumbar Exercises B pallof press with blue TB x 15 reps     Lumbar Exercises: Seated   Hip Flexion on Ball Right;Left;15 reps   Hip Flexion on Ball Limitations cues to avoid sacral sitting     Lumbar Exercises: Supine   Ab Set 15 reps  5" hold and tc    Clam 15 reps;3 seconds   Clam Limitations with red TB around knees and abdom. bracing   Bent Knee Raise 20 reps   Bent Knee Raise Limitations with red TB around knees      Knee/Hip Exercises: Aerobic   Recumbent Bike Lvl 3, 6 min      Knee/Hip Exercises: Seated   Long Arc Quad Right;Left;10 reps   Long Arc Quad Weight 4 lbs.   Long [employer] Limitations with adduction ball saqueeze    Sit to  Sand 2 sets;10 reps;without UE support  blue airex under LE's on 2nd set with B UE pushoff                   PT Short Term Goals - 04/07/17 1243      PT SHORT TERM GOAL #3   Title MMT 5/5 for right hip for improved postural strength   Time 4   Period Weeks   Status On-going           PT Long Term Goals - 04/07/17 1243      PT LONG TERM GOAL #1   Title FOTO < or = to 43% limited   Time 8   Period Weeks   Status On-going     PT LONG TERM GOAL #2   Title able to perform and be independent with advanced  HEP   Time 8   Period Weeks   Status On-going     PT LONG TERM GOAL #4   Title pt reports pain reduced to 2/10 at most during typically aggravating activities   Time 8   Period Weeks   Status On-going               Plan - 04/12/17 1110    Clinical Impression Statement Pt. noting he had some muscular soreness following last visit, which subsided next day.  Feels like therapy is helping.   Performed well with lumbopelvic/LE strengthening therex today without issue.  Mild progression in supine strengthening which was tolerated well.   PT Treatment/Interventions ADLs/Self Care Home Management;Biofeedback;Cryotherapy;Electrical Stimulation;Ultrasound;Traction;Moist Heat;Iontophoresis 22m/ml Dexamethasone;Gait training;Stair training;Functional mobility training;Therapeutic activities;Therapeutic exercise;Patient/family education;Neuromuscular re-education;Balance training;Manual techniques;Taping;Dry needling;Passive range of motion   PT Next Visit Plan continue hip and core strength      Patient will benefit from skilled therapeutic intervention in order to improve the following deficits and impairments:  Abnormal gait, Decreased activity tolerance, Decreased endurance, Decreased coordination, Increased muscle spasms, Postural dysfunction, Pain, Improper body mechanics, Decreased range of motion, Difficulty walking, Obesity  Visit Diagnosis: Acute midline low back pain without sciatica  Muscle weakness (generalized)  Muscle spasm of back     Problem List Patient Active Problem List   Diagnosis Date Noted  . Chronic cough 03/31/2016  . Allergic rhinitis 08/22/2015  . Colitis/ileitis- colonoscopy 2014, 2017 12016-10-23 . Family history of colon cancer in mother deceased age 5975110/23/16 . Type 2 diabetes mellitus, controlled ([place] 07/17/2013  . COPD (chronic obstructive pulmonary disease) ([place] 07/05/2013  . CAD (coronary artery disease) 06/26/2013  . Essential hypertension, benign 06/26/2013  . Dyslipidemia 06/26/2013  . GERD (gastroesophageal reflux disease) 06/26/2013  . History of atrial fibrillation without current medication 06/26/2013  . Gout 06/26/2013  . Osteoarthritis 06/26/2013  . Asthma, mild persistent 06/26/2013  . Alcohol dependence ([place] 06/26/2013  . PTSD (post-traumatic stress disorder) 06/26/2013  . RLS (restless legs syndrome) 06/26/2013  . HLA  B27 (HLA B27 positive) 06/26/2013  . Obesity (BMI 30-39.9) 06/26/2013  . Hx of adenomatous colonic polyps 08/12/2010    MBess Harvest PTA 04/12/17 12:54 PM   [facility] [address] [place] [address]Phone: [phone]  Fax:  [phone] Name: Adam DefeliceMRN: [id number] of Birth: 906/06/1951

## 2017-04-14 ENCOUNTER — Ambulatory Visit: Payer: Medicare Other

## 2017-04-14 DIAGNOSIS — M545 Low back pain, unspecified: Secondary | ICD-10-CM

## 2017-04-14 DIAGNOSIS — M6281 Muscle weakness (generalized): Secondary | ICD-10-CM

## 2017-04-14 DIAGNOSIS — M62830 Muscle spasm of back: Secondary | ICD-10-CM

## 2017-04-14 NOTE — Therapy (Signed)
[facility] [address], [place], [place], [address] Phone: [phone]   Fax:  [phone]  Physical Therapy Treatment  Patient Details  Name: Daymian Lill MRN: [id number] Date of Birth: [birth year] Referring Provider: Lanae Crumbly  Encounter Date: 04/14/2017      PT End of Session - 04/14/17 1109    Visit Number 8   Number of Visits 10   Date for PT Re-Evaluation 05/16/17   Authorization Type mcare gcodes   PT Start Time 1102   PT Stop Time 1142   PT Time Calculation (min) 40 min   Activity Tolerance Patient tolerated treatment well   Behavior During Therapy [facility] for tasks assessed/performed      Past Medical History:  Diagnosis Date  . Allergy   . Anxiety    history of PTSD following CABG  . Arthritis   . Asthma   . Colitis- colonoscopy 2014 07/13/2015  . COPD (chronic obstructive pulmonary disease) ([place])   . Coronary artery disease    x 6  . Depression   . Family history of polyps in the colon   . GERD (gastroesophageal reflux disease)   . Gout   . H/O atrial fibrillation without current medication    following CABG with no documented episodes since then.  Marland Kitchen Hx of adenomatous colonic polyps 08/12/2010  . Hyperlipidemia   . Hypertension   . Prediabetes   . RLS (restless legs syndrome)   . 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 APPENDECTOMY    . COLONOSCOPY W/ BIOPSIES    . CORONARY ANGIOPLASTY WITH STENT PLACEMENT    . CORONARY ARTERY BYPASS GRAFT    . TOTAL HIP ARTHROPLASTY      There were no vitals filed for this visit.      Subjective Assessment - 04/14/17 1106    Subjective Pt. noting some muscular soreness following last treatment which subsided the next day without issue.     Patient Stated Goals get back to riding bike   Currently in Pain? No/denies   Pain Score 0-No pain   Multiple Pain Sites No                          [facility] Adult PT Treatment/Exercise - 04/14/17 1115      Lumbar Exercises: Aerobic   UBE (Upper Arm Bike) Level 1 x 6 minutes (3/3) seated on ball     Lumbar Exercises: Machines for Strengthening   Other Lumbar Machine Exercise cybex pulldown 25# x 20 reps     Lumbar Exercises: Standing   Row Strengthening;Both;15 reps   Theraband Level (Row) Level 4 (Blue)   Shoulder Extension Strengthening;Both;10 reps;Theraband   Theraband Level (Shoulder Extension) Level 4 (Blue)   Shoulder Extension Limitations tc for scapular retraction   Other Standing Lumbar Exercises blue med ball circles with UE while brace core on ball x 15 reps; tc required for neutral pelvis; some UE fatiuge following this     Lumbar Exercises: Supine   Bent Knee Raise 20 reps   Bent Knee Raise Limitations with red TB around knees    Bridge 15 reps;3 seconds   Bridge Limitations Hooklying bridge with sustained hip abd/ER with red TB 3" x 15 reps     Lumbar Exercises: Sidelying   Clam 15 reps;3 seconds   Clam Limitations B sidelying with red TB  around knees      Knee/Hip Exercises: Aerobic   Recumbent Bike Lvl 3, 8 min                   PT Short Term Goals - 04/07/17 1243      PT SHORT TERM GOAL #3   Title MMT 5/5 for right hip for improved postural strength   Time 4   Period Weeks   Status On-going           PT Long Term Goals - 04/07/17 1243      PT LONG TERM GOAL #1   Title FOTO < or = to 43% limited   Time 8   Period Weeks   Status On-going     PT LONG TERM GOAL #2   Title able to perform and be independent with advanced  HEP   Time 8   Period Weeks   Status On-going     PT LONG TERM GOAL #4   Title pt reports pain reduced to 2/10 at most during typically aggravating activities   Time 8   Period Weeks   Status On-going               Plan - 04/14/17 1109    Clinical Impression Statement Pt. doing well today noting some muscle  soreness after last treatment.  Pain free with all therex today.  Lumbopelvic strengthening advancement today with focus on scapular strengthening activities.  Pt. progressing well noting appropriate fatigue at end of treatment.     PT Treatment/Interventions ADLs/Self Care Home Management;Biofeedback;Cryotherapy;Electrical Stimulation;Ultrasound;Traction;Moist Heat;Iontophoresis 49m/ml Dexamethasone;Gait training;Stair training;Functional mobility training;Therapeutic activities;Therapeutic exercise;Patient/family education;Neuromuscular re-education;Balance training;Manual techniques;Taping;Dry needling;Passive range of motion   PT Next Visit Plan continue hip and core strength      Patient will benefit from skilled therapeutic intervention in order to improve the following deficits and impairments:  Abnormal gait, Decreased activity tolerance, Decreased endurance, Decreased coordination, Increased muscle spasms, Postural dysfunction, Pain, Improper body mechanics, Decreased range of motion, Difficulty walking, Obesity  Visit Diagnosis: Acute midline low back pain without sciatica  Muscle weakness (generalized)  Muscle spasm of back     Problem List Patient Active Problem List   Diagnosis Date Noted  . Chronic cough 03/31/2016  . Allergic rhinitis 08/22/2015  . Colitis/ileitis- colonoscopy 2014, 2017 105-Nov-2016 . Family history of colon cancer in mother deceased age 41791Nov 05, 2016 . Type 2 diabetes mellitus, controlled ([place] 07/17/2013  . COPD (chronic obstructive pulmonary disease) ([place] 07/05/2013  . CAD (coronary artery disease) 06/26/2013  . Essential hypertension, benign 06/26/2013  . Dyslipidemia 06/26/2013  . GERD (gastroesophageal reflux disease) 06/26/2013  . History of atrial fibrillation without current medication 06/26/2013  . Gout 06/26/2013  . Osteoarthritis 06/26/2013  . Asthma, mild persistent 06/26/2013  . Alcohol dependence ([place] 06/26/2013  . PTSD  (post-traumatic stress disorder) 06/26/2013  . RLS (restless legs syndrome) 06/26/2013  . HLA B27 (HLA B27 positive) 06/26/2013  . Obesity (BMI 30-39.9) 06/26/2013  . Hx of adenomatous colonic polyps 08/12/2010    MBess Harvest PTA 04/14/17 11:58 AM   [place] [facility] [place] [address] [place] [address]Phone: [phone]  Fax:  [phone] Name: RKaion TisdaleMRN: [id number] of Birth: 9June 15, 1952

## 2017-04-19 ENCOUNTER — Encounter: Payer: Self-pay | Admitting: Physical Therapy

## 2017-04-19 ENCOUNTER — Ambulatory Visit: Payer: Medicare Other | Admitting: Physical Therapy

## 2017-04-19 DIAGNOSIS — M6281 Muscle weakness (generalized): Secondary | ICD-10-CM

## 2017-04-19 DIAGNOSIS — M62830 Muscle spasm of back: Secondary | ICD-10-CM

## 2017-04-19 DIAGNOSIS — M545 Low back pain, unspecified: Secondary | ICD-10-CM

## 2017-04-19 NOTE — Therapy (Signed)
[facility] [address], [place], [place], [address] Phone: [phone]   Fax:  [phone]  Physical Therapy Treatment  Patient Details  Name: Adam [name] MRN: [id number] Date of Birth: Nov 17, 1950 Referring Provider: Lanae Crumbly  Encounter Date: 04/19/2017      PT End of Session - 04/19/17 1357    Visit Number 9   Number of Visits 10   Date for PT Re-Evaluation 05/16/17   Authorization Type mcare gcodes   PT Start Time 6962   PT Stop Time 1438   PT Time Calculation (min) 41 min   Activity Tolerance Patient tolerated treatment well   Behavior During Therapy [facility] for tasks assessed/performed      Past Medical History:  Diagnosis Date  . Allergy   . Anxiety    history of PTSD following CABG  . Arthritis   . Asthma   . Colitis- colonoscopy 2014 07/13/2015  . COPD (chronic obstructive pulmonary disease) ([place])   . Coronary artery disease    x 6  . Depression   . Family history of polyps in the colon   . GERD (gastroesophageal reflux disease)   . Gout   . H/O atrial fibrillation without current medication    following CABG with no documented episodes since then.  Marland Kitchen Hx of adenomatous colonic polyps 08/12/2010  . Hyperlipidemia   . Hypertension   . Prediabetes   . RLS (restless legs syndrome)   . 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 APPENDECTOMY    . COLONOSCOPY W/ BIOPSIES    . CORONARY ANGIOPLASTY WITH STENT PLACEMENT    . CORONARY ARTERY BYPASS GRAFT    . TOTAL HIP ARTHROPLASTY      There were no vitals filed for this visit.      Subjective Assessment - 04/19/17 1408    Subjective Patient was more sore today ever since previous treatment when he was doing some exercises with the ball overhead.  Reports he was painfree and now feels like he is starting over.   Pertinent History COPD, lumbar stress fracture   Limitations  Walking;Standing   How long can you walk comfortably? 100 yards   Patient Stated Goals get back to riding bike   Currently in Pain? Yes   Pain Score 6    Pain Location Back   Pain Orientation Right;Left   Pain Descriptors / Indicators Sharp;Aching   Pain Type Acute pain   Pain Onset More than a month ago   Pain Frequency Intermittent   Aggravating Factors  any movement, walking down hill when walking the dog   Pain Relieving Factors laying flat   Effect of Pain on Daily Activities can't exercise   Multiple Pain Sites No                         [facility] Adult PT Treatment/Exercise - 04/19/17 0001      Neuro Re-ed    Neuro Re-ed Details  pursed lip breathing in supine with abdominal muscle coordination     Lumbar Exercises: Aerobic   Stationary Bike L4 x 8 min     Lumbar Exercises: Machines for Strengthening   Leg Press bilateral 85# x 30  seat 9, cues to keep knees midline through whole ROM     Lumbar Exercises: Supine   Ab Set 15 reps  5" hold with red  pball press   Clam --   Clam Limitations --   Bent Knee Raise --   Bent Knee Raise Limitations --   Bridge 15 reps;3 seconds   Bridge Limitations mini bridge with cues to keep knees in line with hips     Lumbar Exercises: Sidelying   Clam 3 seconds;20 reps  clam and reverse clam     Knee/Hip Exercises: Seated   Long Arc Quad Right;Left;10 reps   Long Arc Quad Weight 4 lbs.   Long [employer] Limitations with adduction ball saqueeze    Sit to [employer] --                  PT Short Term Goals - 04/19/17 1442      PT SHORT TERM GOAL #3   Title MMT 5/5 for right hip for improved postural strength   Time 4   Period Weeks   Status On-going           PT Long Term Goals - 04/19/17 1414      PT LONG TERM GOAL #2   Title able to perform and be independent with advanced  HEP   Time 8   Period Weeks   Status On-going     PT LONG TERM GOAL #3   Title pt demonstrates improved posture with  reduced lumbar lordosis in order to prevent increased pressure on stress fracture   Time 8   Period Weeks   Status On-going     PT LONG TERM GOAL #4   Title pt reports pain reduced to 2/10 at most during typically aggravating activities   Baseline was no pain, but lately has increased again to 5 or 6   Time 8   Period Weeks   Status On-going     PT LONG TERM GOAL #5   Title able to use stationary bike with improved posture in order to safely return to cardio routine   Baseline able to use recumbant stationary bike and feels comfortable    Time 8   Period Weeks   Status Achieved               Plan - 04/19/17 1358    Clinical Impression Statement Patient is doing well overall, but has had flare up of his pain with increased activity.  Pt was educated on continue to do the initial HEP when he has a flare up as he completely stopped doing all activities.  Pt was able to do exercises today without increased pain.  Pt continues to need skilled PT for core and hip strength along with postural training with UE movements for greater core stability.   Rehab Potential Excellent   Clinical Impairments Affecting Rehab Potential COPD   PT Treatment/Interventions ADLs/Self Care Home Management;Biofeedback;Cryotherapy;Electrical Stimulation;Ultrasound;Traction;Moist Heat;Iontophoresis 75m/ml Dexamethasone;Gait training;Stair training;Functional mobility training;Therapeutic activities;Therapeutic exercise;Patient/family education;Neuromuscular re-education;Balance training;Manual techniques;Taping;Dry needling;Passive range of motion   PT Next Visit Plan continue hip and core strength, review pursed lip breathing with core contraction on exhale, FOTO for gcodes (other goals were updated)   Consulted and Agree with Plan of Care Patient      Patient will benefit from skilled therapeutic intervention in order to improve the following deficits and impairments:  Abnormal gait, Decreased activity  tolerance, Decreased endurance, Decreased coordination, Increased muscle spasms, Postural dysfunction, Pain, Improper body mechanics, Decreased range of motion, Difficulty walking, Obesity  Visit Diagnosis: Acute midline low back pain without sciatica  Muscle weakness (generalized)  Muscle spasm of back  Problem List Patient Active Problem List   Diagnosis Date Noted  . Chronic cough 03/31/2016  . Allergic rhinitis 08/22/2015  . Colitis/ileitis- colonoscopy 2014, 2017 07/18/2015  . Family history of colon cancer in mother deceased age 76 07/18/15  . Type 2 diabetes mellitus, controlled ([place]) 07/17/2013  . COPD (chronic obstructive pulmonary disease) ([place]) 07/05/2013  . CAD (coronary artery disease) 06/26/2013  . Essential hypertension, benign 06/26/2013  . Dyslipidemia 06/26/2013  . GERD (gastroesophageal reflux disease) 06/26/2013  . History of atrial fibrillation without current medication 06/26/2013  . Gout 06/26/2013  . Osteoarthritis 06/26/2013  . Asthma, mild persistent 06/26/2013  . Alcohol dependence ([place]) 06/26/2013  . PTSD (post-traumatic stress disorder) 06/26/2013  . RLS (restless legs syndrome) 06/26/2013  . HLA B27 (HLA B27 positive) 06/26/2013  . Obesity (BMI 30-39.9) 06/26/2013  . Hx of adenomatous colonic polyps 08/12/2010    Zannie Cove, PT 04/19/2017, 2:43 PM  [facility] [address], [place], [place], [address] Phone: [phone]   Fax:  [phone]  Name: Taos Tapp MRN: [id number] Date of Birth: 09-May-1951

## 2017-04-20 ENCOUNTER — Ambulatory Visit: Payer: Medicare Other | Admitting: Physical Therapy

## 2017-04-20 ENCOUNTER — Encounter: Payer: Self-pay | Admitting: Physical Therapy

## 2017-04-20 DIAGNOSIS — M6281 Muscle weakness (generalized): Secondary | ICD-10-CM

## 2017-04-20 DIAGNOSIS — M545 Low back pain, unspecified: Secondary | ICD-10-CM

## 2017-04-20 DIAGNOSIS — M62830 Muscle spasm of back: Secondary | ICD-10-CM

## 2017-04-20 NOTE — Therapy (Signed)
[facility] [address] [place], [place], [address] Phone: [phone]   Fax:  [phone]  Physical Therapy Treatment  Patient Details  Name: Adam [name] MRN: [id number] Date of Birth: 11/18/1950 Referring Provider: OWENS, JAMES M  Encounter Date: 04/20/2017      PT End of Session - 04/20/17 1527    Visit Number 10   Number of Visits 10   Authorization Type mcare gcodes   PT Start Time 1520   PT Stop Time 1600   PT Time Calculation (min) 40 min   Activity Tolerance Patient tolerated treatment well   Behavior During Therapy WFL for tasks assessed/performed      Past Medical History:  Diagnosis Date  . Allergy   . Anxiety    history of PTSD following CABG  . Arthritis   . Asthma   . Colitis- colonoscopy 2014 07/13/2015  . COPD (chronic obstructive pulmonary disease) ([facility])   . Coronary artery disease    x 6  . Depression   . Family history of polyps in the colon   . GERD (gastroesophageal reflux disease)   . Gout   . H/O atrial fibrillation without current medication    following CABG with no documented episodes since then.  . Hx of adenomatous colonic polyps 08/12/2010  . Hyperlipidemia   . Hypertension   . Prediabetes   . RLS (restless legs syndrome)   . 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 APPENDECTOMY    . COLONOSCOPY W/ BIOPSIES    . CORONARY ANGIOPLASTY WITH STENT PLACEMENT    . CORONARY ARTERY BYPASS GRAFT    . TOTAL HIP ARTHROPLASTY      There were no vitals filed for this visit.      Subjective Assessment - 04/20/17 1525    Subjective Continues to be better. Pain down to 2-3 level.    Pertinent History COPD, lumbar stress fracture   Limitations Walking;Standing   Currently in Pain? Yes   Pain Score 3    Pain Location Back   Pain Orientation Right;Left;Lower   Pain Descriptors / Indicators Aching   Multiple  Pain Sites No                         [facility] Adult PT Treatment/Exercise - 04/20/17 0001      Self-Care   Self-Care Posture   Posture Lumbar supportive activities and posture      Lumbar Exercises: Stretches   Lower Trunk Rotation --  10x slow rock , then leg lengthener bil 2x     Lumbar Exercises: Aerobic   Stationary Bike L4 x 10 min     Lumbar Exercises: Machines for Strengthening   Leg Press bilateral 85# x 30  seat 9, cues to keep knees midline through whole ROM     Lumbar Exercises: Supine   Ab Set 10 reps  Then add glute squeeze 10x     Lumbar Exercises: Sidelying   Clam 3 seconds;20 reps  clam and reverse clam     Knee/Hip Exercises: Seated   Long Arc Quad Right;Left;2 sets;10 reps   Long Arc Quad Weight 4 lbs.   Long Arc Quad Limitations with adduction ball saqueeze                   PT Short Term Goals - 04/19/17 1442        PT SHORT TERM GOAL #3   Title MMT 5/5 for right hip for improved postural strength   Time 4   Period Weeks   Status On-going           PT Long Term Goals - 04/19/17 1414      PT LONG TERM GOAL #2   Title able to perform and be independent with advanced  HEP   Time 8   Period Weeks   Status On-going     PT LONG TERM GOAL #3   Title pt demonstrates improved posture with reduced lumbar lordosis in order to prevent increased pressure on stress fracture   Time 8   Period Weeks   Status On-going     PT LONG TERM GOAL #4   Title pt reports pain reduced to 2/10 at most during typically aggravating activities   Baseline was no pain, but lately has increased again to 5 or 6   Time 8   Period Weeks   Status On-going     PT LONG TERM GOAL #5   Title able to use stationary bike with improved posture in order to safely return to cardio routine   Baseline able to use recumbant stationary bike and feels comfortable    Time 8   Period Weeks   Status Achieved               Plan - 04/20/17 1528     Clinical Impression Statement Pt has almost recovered fully from his flare up, reporting his pain down to a 2-3 level at this time. He was able to perform all exercises  pain free today and with excellent form.  He plans to wrok on supporting his lumbar spine more often after education of this today.    Rehab Potential Excellent   Clinical Impairments Affecting Rehab Potential COPD   PT Frequency 2x / week   PT Duration 8 weeks   PT Treatment/Interventions ADLs/Self Care Home Management;Biofeedback;Cryotherapy;Electrical Stimulation;Ultrasound;Traction;Moist Heat;Iontophoresis 30m/ml Dexamethasone;Gait training;Stair training;Functional mobility training;Therapeutic activities;Therapeutic exercise;Patient/family education;Neuromuscular re-education;Balance training;Manual techniques;Taping;Dry needling;Passive range of motion   PT Next Visit Plan Need FOTO next, missed last visit: conti with core & hip strength   Consulted and Agree with Plan of Care Patient      Patient will benefit from skilled therapeutic intervention in order to improve the following deficits and impairments:  Abnormal gait, Decreased activity tolerance, Decreased endurance, Decreased coordination, Increased muscle spasms, Postural dysfunction, Pain, Improper body mechanics, Decreased range of motion, Difficulty walking, Obesity  Visit Diagnosis: Acute midline low back pain without sciatica  Muscle weakness (generalized)  Muscle spasm of back     Problem List Patient Active Problem List   Diagnosis Date Noted  . Chronic cough 03/31/2016  . Allergic rhinitis 08/22/2015  . Colitis/ileitis- colonoscopy 2014, 2017 12016/10/17 . Family history of colon cancer in mother deceased age 4689110/17/2016 . Type 2 diabetes mellitus, controlled ([place] 07/17/2013  . COPD (ch[place] obstructive pulmonary disease) ([place] 07/05/2013  . CAD (coronary artery disease) 06/26/2013  . Essential hypertension, benign 06/26/2013  .  Dyslipidemia 06/26/2013  . GERD (gastroesophageal reflux disease) 06/26/2013  . History of atrial fibrillation without current medication 06/26/2013  . Gout 06/26/2013  . Osteoarthritis 06/26/2013  . Asthma, mild persistent 06/26/2013  . Alcohol dependence ([place] 06/26/2013  . PTSD (post-traumatic stress disorder) 06/26/2013  . RLS (restless legs syndrome) 06/26/2013  . HLA B27 (HLA B27 positive) 06/26/2013  . Obesity (BMI 30-39.9) 06/26/2013  . Hx of  adenomatous colonic polyps 08/12/2010    Lanette Ell, PTA 04/20/2017, 4:16 PM  [facility] [address], [place], [place], [address] Phone: [phone]   Fax:  [phone]  Name: Adam [name] MRN: [id number] Date of Birth: 1951/02/06

## 2017-04-20 NOTE — Therapy (Signed)
[facility] [address] [place], [place], [address] Phone: [phone]   Fax:  [phone]  Physical Therapy Treatment  Patient Details  Name: Adam [name] MRN: [id number] Date of Birth: 08/01/1951 Referring Provider: OWENS, JAMES M  Encounter Date: 04/20/2017      PT End of Session - 04/20/17 1527    Visit Number 10   Number of Visits 10   Authorization Type mcare gcodes   PT Start Time 1520   PT Stop Time 1600   PT Time Calculation (min) 40 min   Activity Tolerance Patient tolerated treatment well   Behavior During Therapy WFL for tasks assessed/performed      Past Medical History:  Diagnosis Date  . Allergy   . Anxiety    history of PTSD following CABG  . Arthritis   . Asthma   . Colitis- colonoscopy 2014 07/13/2015  . COPD (chronic obstructive pulmonary disease) ([facility])   . Coronary artery disease    x 6  . Depression   . Family history of polyps in the colon   . GERD (gastroesophageal reflux disease)   . Gout   . H/O atrial fibrillation without current medication    following CABG with no documented episodes since then.  . Hx of adenomatous colonic polyps 08/12/2010  . Hyperlipidemia   . Hypertension   . Prediabetes   . RLS (restless legs syndrome)   . 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 APPENDECTOMY    . COLONOSCOPY W/ BIOPSIES    . CORONARY ANGIOPLASTY WITH STENT PLACEMENT    . CORONARY ARTERY BYPASS GRAFT    . TOTAL HIP ARTHROPLASTY      There were no vitals filed for this visit.      Subjective Assessment - 04/20/17 1525    Subjective Continues to be better. Pain down to 2-3 level.    Pertinent History COPD, lumbar stress fracture   Limitations Walking;Standing   Currently in Pain? Yes   Pain Score 3    Pain Location Back   Pain Orientation Right;Left;Lower   Pain Descriptors / Indicators Aching   Multiple  Pain Sites No                         [facility] Adult PT Treatment/Exercise - 04/20/17 0001      Self-Care   Self-Care Posture   Posture Lumbar supportive activities and posture      Lumbar Exercises: Stretches   Lower Trunk Rotation --  10x slow rock , then leg lengthener bil 2x     Lumbar Exercises: Aerobic   Stationary Bike L4 x 10 min     Lumbar Exercises: Machines for Strengthening   Leg Press bilateral 85# x 30  seat 9, cues to keep knees midline through whole ROM     Lumbar Exercises: Supine   Ab Set 10 reps  Then add glute squeeze 10x     Lumbar Exercises: Sidelying   Clam 3 seconds;20 reps  clam and reverse clam     Knee/Hip Exercises: Seated   Long Arc Quad Right;Left;2 sets;10 reps   Long Arc Quad Weight 4 lbs.   Long Arc Quad Limitations with adduction ball saqueeze                   PT Short Term Goals - 04/19/17 1442        PT SHORT TERM GOAL #3   Title MMT 5/5 for right hip for improved postural strength   Time 4   Period Weeks   Status On-going           PT Long Term Goals - 04/19/17 1414      PT LONG TERM GOAL #2   Title able to perform and be independent with advanced  HEP   Time 8   Period Weeks   Status On-going     PT LONG TERM GOAL #3   Title pt demonstrates improved posture with reduced lumbar lordosis in order to prevent increased pressure on stress fracture   Time 8   Period Weeks   Status On-going     PT LONG TERM GOAL #4   Title pt reports pain reduced to 2/10 at most during typically aggravating activities   Baseline was no pain, but lately has increased again to 5 or 6   Time 8   Period Weeks   Status On-going     PT LONG TERM GOAL #5   Title able to use stationary bike with improved posture in order to safely return to cardio routine   Baseline able to use recumbant stationary bike and feels comfortable    Time 8   Period Weeks   Status Achieved               Plan - 04/20/17 1528     Clinical Impression Statement Pt has almost recovered fully from his flare up, reporting his pain down to a 2-3 level at this time. He was able to perform all exercises  pain free today and with excellent form.  He plans to wrok on supporting his lumbar spine more often after education of this today.    Rehab Potential Excellent   Clinical Impairments Affecting Rehab Potential COPD   PT Frequency 2x / week   PT Duration 8 weeks   PT Treatment/Interventions ADLs/Self Care Home Management;Biofeedback;Cryotherapy;Electrical Stimulation;Ultrasound;Traction;Moist Heat;Iontophoresis 79m/ml Dexamethasone;Gait training;Stair training;Functional mobility training;Therapeutic activities;Therapeutic exercise;Patient/family education;Neuromuscular re-education;Balance training;Manual techniques;Taping;Dry needling;Passive range of motion   Consulted and Agree with Plan of Care Patient      Patient will benefit from skilled therapeutic intervention in order to improve the following deficits and impairments:  Abnormal gait, Decreased activity tolerance, Decreased endurance, Decreased coordination, Increased muscle spasms, Postural dysfunction, Pain, Improper body mechanics, Decreased range of motion, Difficulty walking, Obesity  Visit Diagnosis: Acute midline low back pain without sciatica  Muscle weakness (generalized)  Muscle spasm of back     Problem List Patient Active Problem List   Diagnosis Date Noted  . Chronic cough 03/31/2016  . Allergic rhinitis 08/22/2015  . Colitis/ileitis- colonoscopy 2014, 2017 110-14-2016 . Family history of colon cancer in mother deceased age 6917110/14/2016 . Type 2 diabetes mellitus, controlled ([place] 07/17/2013  . COPD (chronic obstructive pulmonary disease) ([place] 07/05/2013  . CAD (coronary artery disease) 06/26/2013  . Essential hypertension, benign 06/26/2013  . Dyslipidemia 06/26/2013  . GERD (gastroesophageal reflux disease) 06/26/2013  . History of  atrial fibrillation without current medication 06/26/2013  . Gout 06/26/2013  . Osteoarthritis 06/26/2013  . Asthma, mild persistent 06/26/2013  . Alcohol dependence ([place] 06/26/2013  . PTSD (post-traumatic stress disorder) 06/26/2013  . RLS (restless legs syndrome) 06/26/2013  . HLA B27 (HLA B27 positive) 06/26/2013  . Obesity (BMI 30-39.9) 06/26/2013  . Hx of adenomatous colonic polyps 08/12/2010    Isamu Trammel, PTA 04/20/2017, 4:05 PM  [facility]  [address], [address] [place], [place], [address] Phone: [phone]   Fax:  [phone]  Name: Tiyon Riemann MRN: [id number] Date of Birth: 12/08/1950   

## 2017-04-26 ENCOUNTER — Ambulatory Visit: Payer: Medicare Other | Admitting: Physical Therapy

## 2017-04-26 DIAGNOSIS — M62830 Muscle spasm of back: Secondary | ICD-10-CM

## 2017-04-26 DIAGNOSIS — M6281 Muscle weakness (generalized): Secondary | ICD-10-CM

## 2017-04-26 DIAGNOSIS — M545 Low back pain, unspecified: Secondary | ICD-10-CM

## 2017-04-26 NOTE — Patient Instructions (Signed)
RE-ALIGNMENT ROUTINE EXERCISES BASIC FOR POSTURAL CORRECTION   RE-ALIGNMENT Tips BENEFITS: 1.It helps to re-align the curves of the back and improve standing posture. 2.It allows the back muscles to rest and strengthen in preparation for more activity. FREQUENCY: Daily, even after weeks, months and years of more advanced exercises. START: 1.All exercises start in the same position: lying on the back, arms resting on the supporting surface, palms up and slightly away from the body, backs of hands down, knees bent, feet flat. 2.The head, neck, arms, and legs are supported according to specific instructions of your therapist. Copyright  VHI. All rights reserved.    1. Decompression Exercise: Basic.   Takes compression off the vertebral bodies; increases tolerance for lying on the back; helps relieve back pain   Lie on back on firm surface, knees bent, feet flat, arms turned up, out to sides (~35 degrees). Head neck and arms supported as necessary. Time _5-15__ minutes. Surface: floor     2. Shoulder Press  Strengthens upper back extensors and scapular retractors.   Press both shoulders down. Hold _2-3__ seconds. Repeat _3-5__ times. Surface: floor        3. Head Press With [place]  Strengthens neck extensors   Tuck chin SLIGHTLY toward chest, keep mouth closed. Feel weight on back of head. Increase weight by pressing head down. Hold _2-3__ seconds. Relax. Repeat 3-5___ times. Surface: floor   4. Leg Lengthener:  As if someone is pulling your leg.  5x 5 sec holds   5. Whole leg press down "into the sand."    5x 5 sec hold  Ruben Im PT [facility] [address], [place], [place] [address] Phone # [phone] Fax [phone]

## 2017-04-26 NOTE — Therapy (Addendum)
[facility] [address] [place], [place], [address] Phone: [phone]   Fax:  [phone]  Physical Therapy Treatment  Patient Details  Name: Adam [name] MRN: [id number] Date of Birth: [birth year] Referring Provider: Lanae Crumbly  Encounter Date: 04/26/2017      PT End of Session - 04/26/17 1321    Visit Number 11   Number of Visits 20   Date for PT Re-Evaluation 05/16/17   Authorization Type mcare gcodes;  KX at visit 15   PT Start Time 1232   PT Stop Time 1320   PT Time Calculation (min) 48 min   Activity Tolerance Patient tolerated treatment well      Past Medical History:  Diagnosis Date  . Allergy   . Anxiety    history of PTSD following CABG  . Arthritis   . Asthma   . Colitis- colonoscopy 2014 07/13/2015  . COPD (chronic obstructive pulmonary disease) ([place])   . Coronary artery disease    x 6  . Depression   . Family history of polyps in the colon   . GERD (gastroesophageal reflux disease)   . Gout   . H/O atrial fibrillation without current medication    following CABG with no documented episodes since then.  Marland Kitchen Hx of adenomatous colonic polyps 08/12/2010  . Hyperlipidemia   . Hypertension   . Prediabetes   . RLS (restless legs syndrome)   . 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 APPENDECTOMY    . COLONOSCOPY W/ BIOPSIES    . CORONARY ANGIOPLASTY WITH STENT PLACEMENT    . CORONARY ARTERY BYPASS GRAFT    . TOTAL HIP ARTHROPLASTY      There were no vitals filed for this visit.      Subjective Assessment - 04/26/17 1235    Subjective I still have pain from recent flare up.  Currently 3/10 but it was an 8/10.     Pertinent History COPD, lumbar stress fracture   Currently in Pain? Yes   Pain Score 3    Pain Location Back   Pain Orientation Right;Left;Lower   Pain Type Acute pain   Aggravating Factors  walking dog  downhill   Pain Relieving Factors lying down or recliner                         [facility] Adult PT Treatment/Exercise - 04/26/17 0001      Neuro Re-ed    Neuro Re-ed Details  transverse abdominal activation in supine and sitting;  quad and gluteal activation     Lumbar Exercises: Stretches   Quadruped Mid Back Stretch Limitations psoas doorway stretch 3x5     Lumbar Exercises: Machines for Strengthening   Leg Press bilateral 90# x 20;  single 45# 15x   seat 9, cues to keep knees midline through whole ROM     Lumbar Exercises: Standing   Other Standing Lumbar Exercises resisted walking backward 30# 10x     Lumbar Exercises: Seated   Sit to Stand Limitations foam roll push down 10x 5 sec holds 40% effort      Lumbar Exercises: Supine   Ab Set 5 reps   Isometric Hip Flexion 5 reps   Other Supine Lumbar Exercises decompression series 5x each:  arm press, shoulder press, head press, long leg pull, whole leg press     Knee/Hip  Exercises: Aerobic   Recumbent Bike Lvl 3, 10 min      Knee/Hip Exercises: Seated   Long Arc Quad Right;Left;2 sets;10 reps   Long Arc Quad Weight 4 lbs.   Long [employer] Limitations with adduction ball saqueeze                 PT Education - 04/26/17 1259    Education provided Yes   Education Details decompression exercises    Person(s) Educated Patient   Methods Explanation;Demonstration;Handout   Comprehension Verbalized understanding;Returned demonstration          PT Short Term Goals - 04/26/17 1329      PT SHORT TERM GOAL #1   Title independent with initial HEP   Status Achieved     PT SHORT TERM GOAL #2   Title reports 25% less low back pain    Status Achieved     PT SHORT TERM GOAL #3   Title MMT 5/5 for right hip for improved postural strength   Time 4   Period Weeks   Status On-going           PT Long Term Goals - 04/26/17 1330      PT LONG TERM GOAL #1   Title FOTO < or = to 43% limited   Time 8    Period Weeks   Status On-going     PT LONG TERM GOAL #2   Title able to perform and be independent with advanced  HEP   Time 8   Period Weeks   Status On-going     PT LONG TERM GOAL #3   Title pt demonstrates improved posture with reduced lumbar lordosis in order to prevent increased pressure on stress fracture   Time 8   Period Weeks   Status On-going     PT LONG TERM GOAL #4   Title pt reports pain reduced to 2/10 at most during typically aggravating activities   Time 8   Period Weeks   Status On-going     PT LONG TERM GOAL #5   Title able to use stationary bike with improved posture in order to safely return to cardio routine   Status Achieved               Plan - 04/26/17 1322    Clinical Impression Statement Patient continues to report mild back pain following recent flare up.  Has difficulty walking downhill with his dog.  Close supervision with all exercise to monitor pain response.  Do reports of pain with any of the exercises but reports he can feel the muscles working.     PT Frequency 2x / week   PT Duration 8 weeks   PT Treatment/Interventions ADLs/Self Care Home Management;Biofeedback;Cryotherapy;Electrical Stimulation;Ultrasound;Traction;Moist Heat;Iontophoresis 24m/ml Dexamethasone;Gait training;Stair training;Functional mobility training;Therapeutic activities;Therapeutic exercise;Patient/family education;Neuromuscular re-education;Balance training;Manual techniques;Taping;Dry needling;Passive range of motion   PT Next Visit Plan Need FOTO (not done on previous visit);  continue with core & hip strength      Patient will benefit from skilled therapeutic intervention in order to improve the following deficits and impairments:  Abnormal gait, Decreased activity tolerance, Decreased endurance, Decreased coordination, Increased muscle spasms, Postural dysfunction, Pain, Improper body mechanics, Decreased range of motion, Difficulty walking, Obesity  Visit  Diagnosis: Acute midline low back pain without sciatica  Muscle weakness (generalized)  Muscle spasm of back  Gcodes: Current CK Goal CK Discharge CK   Problem List Patient Active Problem List   Diagnosis Date Noted  .  Chronic cough 03/31/2016  . Allergic rhinitis 08/22/2015  . Colitis/ileitis- colonoscopy 2014, 2017 07-14-15  . Family history of colon cancer in mother deceased age 84 2015-07-14  . Type 2 diabetes mellitus, controlled ([place]) 07/17/2013  . COPD (chronic obstructive pulmonary disease) ([place]) 07/05/2013  . CAD (coronary artery disease) 06/26/2013  . Essential hypertension, benign 06/26/2013  . Dyslipidemia 06/26/2013  . GERD (gastroesophageal reflux disease) 06/26/2013  . History of atrial fibrillation without current medication 06/26/2013  . Gout 06/26/2013  . Osteoarthritis 06/26/2013  . Asthma, mild persistent 06/26/2013  . Alcohol dependence ([place]) 06/26/2013  . PTSD (post-traumatic stress disorder) 06/26/2013  . RLS (restless legs syndrome) 06/26/2013  . HLA B27 (HLA B27 positive) 06/26/2013  . Obesity (BMI 30-39.9) 06/26/2013  . Hx of adenomatous colonic polyps 08/12/2010   Ruben Im, PT 04/26/17 1:32 PM Phone: [phone] Fax: [phone]  Alvera Singh 04/26/2017, 1:31 PM  [facility] [address], [place], [place], [address] Phone: [phone]   Fax:  [phone]  Name: Hamid Brookens MRN: [id number] Date of Birth: [birth year]  PHYSICAL THERAPY DISCHARGE SUMMARY  Visits from Start of Care: 11  Current functional level related to goals / functional outcomes: See above   Remaining deficits: See above   Education / Equipment: HEP  Plan: Patient agrees to discharge.  Patient goals were not met. Patient is being discharged due to not returning since the last visit.  ?????         Google, PT 06/02/17 12:04 PM

## 2017-05-03 DIAGNOSIS — L570 Actinic keratosis: Secondary | ICD-10-CM | POA: Diagnosis not present

## 2017-05-03 DIAGNOSIS — L821 Other seborrheic keratosis: Secondary | ICD-10-CM | POA: Diagnosis not present

## 2017-05-03 DIAGNOSIS — C4442 Squamous cell carcinoma of skin of scalp and neck: Secondary | ICD-10-CM | POA: Diagnosis not present

## 2017-05-03 DIAGNOSIS — D485 Neoplasm of uncertain behavior of skin: Secondary | ICD-10-CM | POA: Diagnosis not present

## 2017-05-05 ENCOUNTER — Encounter: Payer: Medicare Other | Admitting: Physical Therapy

## 2017-05-20 ENCOUNTER — Encounter: Payer: Self-pay | Admitting: Family Medicine

## 2017-05-20 ENCOUNTER — Other Ambulatory Visit: Payer: Self-pay | Admitting: Family Medicine

## 2017-05-20 DIAGNOSIS — E119 Type 2 diabetes mellitus without complications: Secondary | ICD-10-CM | POA: Diagnosis not present

## 2017-06-08 ENCOUNTER — Ambulatory Visit (INDEPENDENT_AMBULATORY_CARE_PROVIDER_SITE_OTHER): Payer: Medicare Other

## 2017-06-08 VITALS — BP 124/80 | HR 88 | Ht 70.0 in | Wt 262.0 lb

## 2017-06-08 DIAGNOSIS — Z0000 Encounter for general adult medical examination without abnormal findings: Secondary | ICD-10-CM

## 2017-06-08 DIAGNOSIS — Z23 Encounter for immunization: Secondary | ICD-10-CM | POA: Diagnosis not present

## 2017-06-08 DIAGNOSIS — Z1159 Encounter for screening for other viral diseases: Secondary | ICD-10-CM | POA: Diagnosis not present

## 2017-06-08 NOTE — Patient Instructions (Addendum)
Mr. Niemann , Thank you for taking time to come for your Medicare Wellness Visit. I appreciate your ongoing commitment to your health goals. Please review the following plan we discussed and let me know if I can assist you in the future.   May discuss pulmonary rehab with pulmonologist  [email or web address]  [facility] [place] - can assist with hearing aid x 1  No reviews  [address] Office  [phone]  [phone]  Medicare now request all "baby boomers" test for possible exposure to Hepatitis C. Many may have been exposed due to dental work, tatoo's, vaccinations when young. The Hepatitis C virus is dormant for many years and then sometimes will cause liver cancer. If you gave blood in the past 15 years, you were most likely checked for Hep C. If you rec'd blood; you may want to consider testing or if you are high risk for any other reason.    Keep in mind the flu shot is an inactivated vaccine and takes at least 2 weeks to build immunity. The flu virus can be dormant for 4 days prior to symptoms Taking the flu shot at the beginning of the season can reduce the risk for the entire community.   Will take flu vaccine today     These are the goals we discussed: Goals    . Increase water intake          [facility]  Phone: [phone]   Dennard Nip, MD [phone]                  This is a list of the screening recommended for you and due dates:  Health Maintenance  Topic Date Due  .  Hepatitis C: One time screening is recommended by Center for Disease Control  (CDC) for  adults born from [birth year] through 1966.   [birth year]  . Flu Shot  05/04/2017  . Hemoglobin A1C  06/23/2017  . Complete foot exam   06/25/2017  . Eye exam for diabetics  09/08/2017  . Tetanus Vaccine  10/07/2019  . Colon Cancer  Screening  11/19/2020  . Pneumonia vaccines  Completed        Fall Prevention in the Home Falls can cause injuries. They can happen to people of all ages. There are many things you can do to make your home safe and to help prevent falls. What can I do on the outside of my home?  Regularly fix the edges of walkways and driveways and fix any cracks.  Remove anything that might make you trip as you walk through a door, such as a raised step or threshold.  Trim any bushes or trees on the path to your home.  Use bright outdoor lighting.  Clear any walking paths of anything that might make someone trip, such as rocks or tools.  Regularly check to see if handrails are loose or broken. Make sure that both sides of any steps have handrails.  Any raised decks and porches should have guardrails on the edges.  Have any leaves, snow, or ice cleared regularly.  Use sand or salt on walking paths during winter.  Clean up any spills in your garage right away. This includes oil or grease spills. What can I do in the bathroom?  Use night lights.  Install grab bars by the toilet and in the tub  and shower. Do not use towel bars as grab bars.  Use non-skid mats or decals in the tub or shower.  If you need to sit down in the shower, use a plastic, non-slip stool.  Keep the floor dry. Clean up any water that spills on the floor as soon as it happens.  Remove soap buildup in the tub or shower regularly.  Attach bath mats securely with double-sided non-slip rug tape.  Do not have throw rugs and other things on the floor that can make you trip. What can I do in the bedroom?  Use night lights.  Make sure that you have a light by your bed that is easy to reach.  Do not use any sheets or blankets that are too big for your bed. They should not hang down onto the floor.  Have a firm chair that has side arms. You can use this for support while you get dressed.  Do not have throw rugs and  other things on the floor that can make you trip. What can I do in the kitchen?  Clean up any spills right away.  Avoid walking on wet floors.  Keep items that you use a lot in easy-to-reach places.  If you need to reach something above you, use a strong step stool that has a grab bar.  Keep electrical cords out of the way.  Do not use floor polish or wax that makes floors slippery. If you must use wax, use non-skid floor wax.  Do not have throw rugs and other things on the floor that can make you trip. What can I do with my stairs?  Do not leave any items on the stairs.  Make sure that there are handrails on both sides of the stairs and use them. Fix handrails that are broken or loose. Make sure that handrails are as long as the stairways.  Check any carpeting to make sure that it is firmly attached to the stairs. Fix any carpet that is loose or worn.  Avoid having throw rugs at the top or bottom of the stairs. If you do have throw rugs, attach them to the floor with carpet tape.  Make sure that you have a light switch at the top of the stairs and the bottom of the stairs. If you do not have them, ask someone to add them for you. What else can I do to help prevent falls?  Wear shoes that: ? Do not have high heels. ? Have rubber bottoms. ? Are comfortable and fit you well. ? Are closed at the toe. Do not wear sandals.  If you use a stepladder: ? Make sure that it is fully opened. Do not climb a closed stepladder. ? Make sure that both sides of the stepladder are locked into place. ? Ask someone to hold it for you, if possible.  Clearly mark and make sure that you can see: ? Any grab bars or handrails. ? First and last steps. ? Where the edge of each step is.  Use tools that help you move around (mobility aids) if they are needed. These include: ? Canes. ? Walkers. ? Scooters. ? Crutches.  Turn on the lights when you go into a dark area. Replace any light bulbs as  soon as they burn out.  Set up your furniture so you have a clear path. Avoid moving your furniture around.  If any of your floors are uneven, fix them.  If there are any pets around  you, be aware of where they are.  Review your medicines with your doctor. Some medicines can make you feel dizzy. This can increase your chance of falling. Ask your doctor what other things that you can do to help prevent falls. This information is not intended to replace advice given to you by your health care provider. Make sure you discuss any questions you have with your health care provider. Document Released: 07/17/2009 Document Revised: 02/26/2016 Document Reviewed: 10/25/2014 Elsevier Interactive Patient Education  2018 [place] Maintenance, Male A healthy lifestyle and preventive care is important for your health and wellness. Ask your health care provider about what schedule of regular examinations is right for you. What should I know about weight and diet? Eat a Healthy Diet  Eat plenty of vegetables, fruits, whole grains, low-fat dairy products, and lean protein.  Do not eat a lot of foods high in solid fats, added sugars, or salt.  Maintain a Healthy Weight Regular exercise can help you achieve or maintain a healthy weight. You should:  Do at least 150 minutes of exercise each week. The exercise should increase your heart rate and make you sweat (moderate-intensity exercise).  Do strength-training exercises at least twice a week.  Watch Your Levels of Cholesterol and Blood Lipids  Have your blood tested for lipids and cholesterol every 5 years starting at 67 years of age. If you are at high risk for heart disease, you should start having your blood tested when you are 67 years old. You may need to have your cholesterol levels checked more often if: ? Your lipid or cholesterol levels are high. ? You are older than 67 years of age. ? You are at high risk for heart  disease.  What should I know about cancer screening? Many types of cancers can be detected early and may often be prevented. Lung Cancer  You should be screened every year for lung cancer if: ? You are a current smoker who has smoked for at least 30 years. ? You are a former smoker who has quit within the past 15 years.  Talk to your health care provider about your screening options, when you should start screening, and how often you should be screened.  Colorectal Cancer  Routine colorectal cancer screening usually begins at 67 years of age and should be repeated every 5-10 years until you are 67 years old. You may need to be screened more often if early forms of precancerous polyps or small growths are found. Your health care provider may recommend screening at an earlier age if you have risk factors for colon cancer.  Your health care provider may recommend using home test kits to check for hidden blood in the stool.  A small camera at the end of a tube can be used to examine your colon (sigmoidoscopy or colonoscopy). This checks for the earliest forms of colorectal cancer.  Prostate and Testicular Cancer  Depending on your age and overall health, your health care provider may do certain tests to screen for prostate and testicular cancer.  Talk to your health care provider about any symptoms or concerns you have about testicular or prostate cancer.  Skin Cancer  Check your skin from head to toe regularly.  Tell your health care provider about any new moles or changes in moles, especially if: ? There is a change in a mole's size, shape, or color. ? You have a mole that is larger than a pencil eraser.  Always use sunscreen. Apply sunscreen liberally and repeat throughout the day.  Protect yourself by wearing long sleeves, pants, a wide-brimmed hat, and sunglasses when outside.  What should I know about heart disease, diabetes, and high blood pressure?  If you are 18-39 years  of age, have your blood pressure checked every 3-5 years. If you are 91 years of age or older, have your blood pressure checked every year. You should have your blood pressure measured twice-once when you are at a hospital or clinic, and once when you are not at a hospital or clinic. Record the average of the two measurements. To check your blood pressure when you are not at a hospital or clinic, you can use: ? An automated blood pressure machine at a pharmacy. ? A home blood pressure monitor.  Talk to your health care provider about your target blood pressure.  If you are between 21-4 years old, ask your health care provider if you should take aspirin to prevent heart disease.  Have regular diabetes screenings by checking your fasting blood sugar level. ? If you are at a normal weight and have a low risk for diabetes, have this test once every three years after the age of 58. ? If you are overweight and have a high risk for diabetes, consider being tested at a younger age or more often.  A one-time screening for abdominal aortic aneurysm (AAA) by ultrasound is recommended for men aged 50-75 years who are current or former smokers. What should I know about preventing infection? Hepatitis B If you have a higher risk for hepatitis B, you should be screened for this virus. Talk with your health care provider to find out if you are at risk for hepatitis B infection. Hepatitis C Blood testing is recommended for:  Everyone born from [birth year] through 1966.  Anyone with known risk factors for hepatitis C.  Sexually Transmitted Diseases (STDs)  You should be screened each year for STDs including gonorrhea and chlamydia if: ? You are sexually active and are younger than 67 years of age. ? You are older than 67 years of age and your health care provider tells you that you are at risk for this type of infection. ? Your sexual activity has changed since you were last screened and you are at an increased  risk for chlamydia or gonorrhea. Ask your health care provider if you are at risk.  Talk with your health care provider about whether you are at high risk of being infected with HIV. Your health care provider may recommend a prescription medicine to help prevent HIV infection.  What else can I do?  Schedule regular health, dental, and eye exams.  Stay current with your vaccines (immunizations).  Do not use any tobacco products, such as cigarettes, chewing tobacco, and e-cigarettes. If you need help quitting, ask your health care provider.  Limit alcohol intake to no more than 2 drinks per day. One drink equals 12 ounces of beer, 5 ounces of wine, or 1 ounces of hard liquor.  Do not use street drugs.  Do not share needles.  Ask your health care provider for help if you need support or information about quitting drugs.  Tell your health care provider if you often feel depressed.  Tell your health care provider if you have ever been abused or do not feel safe at home. This information is not intended to replace advice given to you by your health care provider. Make sure you discuss any  questions you have with your health care provider. Document Released: 03/18/2008 Document Revised: 05/19/2016 Document Reviewed: 06/24/2015 Elsevier Interactive Patient Education  [employer].

## 2017-06-08 NOTE — Progress Notes (Addendum)
Subjective:   Adam [name] is a 66 y.o. male who presents for an Initial Medicare Annual Wellness Visit.  The Patient was informed that the wellness visit is to identify future health risk and educate and initiate measures that can reduce risk for increased disease through the lifespan.    Annual Wellness Assessment  Reports health as good;  Has not had a cardiac episode in years   Preventive Screening -Counseling & Management  Medicare Annual Preventive Care Visit - Subsequent Last OV 03/2017 - 11/2020  Family hx of colon cancer Colonoscopy 11/2015 - repeat in 5 years  Former smoker; quit 15' but only smoked approx 7 years  09/26/2012 CT of the Abd and pelvis and neg for AAA Had MI  at 35; prior to that she had 25 stents in his heart per his reporyt  No further incident of heart issues  PSA 06/2016; apt with Dr. 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 Elease Hashimoto is trying to get him off Ativan and tried the lexapro  VS reviewed;   Diet   states Diet is fine Gained approx 30 lbs; stopped exercising because of his fx back. 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 Leafy Ro at [place] [facility] and he may discuss with Dr. Elease Hashimoto     BMI 37   Exercise can't exercise now  Feb got off stationary bike and could not walk Stress fx in the lower back and was in PT  Pain now is marginal  Recumbent bike recommended  Used to lift weights; was always exercising  Weight gain started last year   Diabetic eye exam  09/2016 Hearing Screening Comments: Hearing issues Can have hearing screen;   Vision Screening Comments: Checked in 09/2016 Dr. Macarthur Critchley  Knee surgery 2013   Dental no issues; just had a crown in   Stressors:  Anxiety is managed ok currently   Sleep patterns: sleeps well   Pain? Was in back with fx;      Cardiac Risk Factors Addressed Hx of CABG  Hyperlipidemia - chol 176; hdl 41; ldl 107 and trig 279  Discussed trig most likely related to ETOH but does not agree to slow down on his shots in the evening  Diabetes  - A1c 6.4 with fasting 2017 128    Advanced Directives- Completed   Patient Care Team: [name], MD as PCP - General (Family Medicine) Dr. Alvan Dame ortho Dr. Lamonte Sakai for pulmonary   Immunization History  Administered Date(s) Administered  . Influenza Split 07/04/2012  . Influenza, High Dose Seasonal PF 05/28/2016  . Influenza,inj,Quad PF,6+ Mos 07/05/2013, 05/22/2014, 06/17/2015  . Pneumococcal Conjugate-13 12/01/2015  . Pneumococcal Polysaccharide-23 10/05/2006, 11/30/2016  . Td 10/06/2009  . Zoster 08/21/2013  . Zoster Recombinat (Shingrix) 12/02/2016, 04/02/2017   Required Immunizations needed today  Screening test up to date or reviewed for plan of completion Health Maintenance Due  Topic Date Due  . Hepatitis C Screening  [birth year]  .  INFLUENZA VACCINE  05/04/2017  agreed to Hep c next blood draw.   Cardiac Risk Factors include: advanced age (>49mn, >>70women);diabetes mellitus;dyslipidemia;family history of premature cardiovascular disease;hypertension;male gender;obesity (BMI >30kg/m2)had the shingrix The 2nd injection, he got nauseated and had fever and chills and was sick x 24 hours;  Did fine with the first injection Would like to hold flu vaccine today     Objective:    Today's Vitals   06/08/17 1114  BP: 124/80  Pulse: 88  SpO2: 98%  Weight: 262 lb (118.8 kg)  Height: '5\' 10"'  (1.778 m)   Body mass index is 37.59  kg/m.  Current Medications (verified) Outpatient Encounter Prescriptions as of 06/08/2017  Medication Sig  . albuterol (PROAIR HFA) 108 (90 BASE) MCG/ACT inhaler Inhale 1-2 puffs into the lungs every 6 (six) hours as needed for wheezing or shortness of breath.  . allopurinol (ZYLOPRIM) 300 MG tablet Take 1 tablet (300 mg total) by mouth daily.  .Marland Kitchenaspirin 81 MG tablet Take 81 mg by mouth daily.  .Marland KitchenBREO ELLIPTA 100-25 MCG/INH AEPB INHALE 1 PUFF BY MOUTH EVERY DAY  . Coenzyme Q10 (CO Q-10) 100 MG CAPS Take 2 tablets by mouth daily.  . fenofibrate 160 MG tablet take 1 tablet by mouth once daily  . fluticasone (FLONASE) 50 MCG/ACT nasal spray Place 1 spray into the nose daily.   .Marland Kitchenglucose blood (ONETOUCH VERIO) test strip Test glucose once daily E11.9  . Insulin Glargine (TOUJEO SOLOSTAR) 300 UNIT/ML SOPN Inject 80 Units into the skin as directed.  . Insulin Pen Needle (BD PEN NEEDLE NANO U/F) 32G X 4 MM MISC Use as instructed  . LORazepam (ATIVAN) 0.5 MG tablet Take 1 tablet (0.5 mg total) by mouth every 8 (eight) hours as needed. for anxiety  . losartan-hydrochlorothiazide (HYZAAR) 100-25 MG tablet TAKE 1 TABLET BY MOUTH EVERY DAY  . metFORMIN (GLUCOPHAGE) 1000 MG tablet TAKE 1 TABLET BY MOUTH TWICE DAILY WITH A MEAL  . metoprolol (LOPRESSOR) 100 MG tablet Take 100 mg by mouth 2 (two) times daily.  . montelukast (SINGULAIR) 10 MG tablet Take 1 tablet (10 mg total) by mouth at bedtime.  . Olopatadine HCl (PATADAY) 0.2 % SOLN Apply 1 drop to eye as needed.  .Glory RosebushDELICA LANCETS FINE MISC 1 Device by Other route as needed. Use as instructed.  DX: 250.00  . pantoprazole (PROTONIX) 40 MG tablet Take 1 tablet (40 mg total) by mouth 2 (two) times daily.  . pramipexole (MIRAPEX) 0.25 MG tablet Take 1 tablet (0.25 mg total) by mouth at bedtime.  . simvastatin (ZOCOR) 80 MG tablet Take 1 tablet (80 mg total) by mouth daily.  .Marland KitchenSPIRIVA HANDIHALER 18 MCG inhalation capsule INHALE CONTENTS OF 1 CAPSULE  ONCE DAILY USING HANDIHALER  . escitalopram (LEXAPRO) 10 MG tablet Take 1 tablet (10 mg total) by mouth daily. (Patient not taking: Reported on 06/08/2017)   No facility-administered encounter medications on file as of 06/08/2017.     Allergies (verified) Clindamycin/lincomycin and Keflex [cephalexin]   History: Past Medical History:  Diagnosis Date  . Allergy   . Anxiety    history of PTSD following CABG  . Arthritis   . Asthma   . Colitis- colonoscopy 2014 07/13/2015  . [place]   . Coronary artery disease    x 6  . Depression   . Family history of polyps in the colon   . GERD (gastroesophageal reflux disease)   . Gout   .  H/O atrial fibrillation without current medication    following CABG with no documented episodes since then.  Marland Kitchen Hx of adenomatous colonic polyps 08/12/2010  . Hyperlipidemia   . Hypertension   . Prediabetes   . RLS (restless legs syndrome)   . 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 APPENDECTOMY    . COLONOSCOPY W/ BIOPSIES    . CORONARY ANGIOPLASTY WITH STENT PLACEMENT    . CORONARY ARTERY BYPASS GRAFT    . TOTAL HIP ARTHROPLASTY     Family History  Problem Relation Age of Onset  . Colon cancer Mother   . Heart disease Paternal Grandmother   . Heart disease Paternal Grandfather    Social History   Occupational History  . retired    Social History Main Topics  . Smoking status: Former Smoker    Packs/day: 1.00    Years: 7.00    Types: Cigarettes    Quit date: 10/04/1977  . Smokeless tobacco: Never Used     Comment: never smoked over 1 pack   . Alcohol use Yes     Comment: stopped wine and now drinks 3 to 4 scotches   . Drug use: No     Comment: Smoked Marijuana back in 1970s  . Sexual activity: Yes   Tobacco Counseling Counseling given: Yes   Activities of Daily Living In your present state of health, do you have any difficulty  performing the following activities: 06/08/2017  Hearing? N  Vision? N  Difficulty concentrating or making decisions? N  Walking or climbing stairs? N  Dressing or bathing? N  Doing errands, shopping? N  Preparing Food and eating ? N  Using the Toilet? N  In the past six months, have you accidently leaked urine? N  Do you have problems with loss of bowel control? N  Managing your Medications? N  Managing your Finances? N  Housekeeping or managing your Housekeeping? N  Some recent data might be hidden    Immunizations and Health Maintenance Immunization History  Administered Date(s) Administered  . Influenza Split 07/04/2012  . Influenza, High Dose Seasonal PF 05/28/2016  . Influenza,inj,Quad PF,6+ Mos 07/05/2013, 05/22/2014, 06/17/2015  . Pneumococcal Conjugate-13 12/01/2015  . Pneumococcal Polysaccharide-23 10/05/2006, 11/30/2016  . Td 10/06/2009  . Zoster 08/21/2013  . Zoster Recombinat (Shingrix) 12/02/2016, 04/02/2017   Health Maintenance Due  Topic Date Due  . Hepatitis C Screening  04/20/1951  . INFLUENZA VACCINE  05/04/2017    Patient Care Team: [name], MD as PCP - General (Family Medicine)  Indicate any recent Medical Services you may have received from other than [facility] providers in the past year (date may be approximate).    Assessment:   This is a routine wellness examination for Theoren.   Hearing/Vision screen Hearing Screening Comments: Hearing issues Can have hearing screen;   Vision Screening Comments: Checked in 09/2016 Dr. Macarthur Critchley  Dietary issues and exercise activities discussed: Current Exercise Habits: Home exercise routine, Exercise limited by: orthopedic condition(s)  Goals    . Increase water intake          [facility]  Phone: [phone]   Adam [name], Adam [name] 2/9  Scores 06/08/2017 12/21/2016 08/01/2015  PHQ -  2 Score 0 0 0    Fall Risk Fall Risk  06/08/2017 12/21/2016 08/01/2015  Falls in the past year? No No No    Cognitive Function: Ad8 score reviewed for issues:  Issues making decisions:  Less interest in hobbies / activities:  Repeats questions, stories (family complaining):  Trouble using ordinary gadgets (microwave, computer, phone):  Forgets the month or year:   Mismanaging finances:   Remembering appts:  Daily problems with thinking and/or memory: Ad8 score is=0  Wife had a stroke in Oct She is doing ok;    MMSE - Mini Mental State Exam 06/08/2017  Not completed: (No Data)  Ad8 score 0       Screening Tests Health Maintenance  Topic Date Due  . Hepatitis C Screening  07/15/1951  . INFLUENZA VACCINE  05/04/2017  . HEMOGLOBIN A1C  06/23/2017  . FOOT EXAM  06/25/2017  . OPHTHALMOLOGY EXAM  09/08/2017  . TETANUS/TDAP  10/07/2019  . COLONOSCOPY  11/19/2020  . PNA vac Low Risk Adult  Completed        Plan:     PCP Notes   Health Maintenance Smoking hx; CT of abd and pelvis completed in the past and was neg for AAA   Agreed to hep c at the next blood draw  Took his Flu vaccine today     Abnormal Screens  BMI  Over 30 pound weight gain since  Last year; started with fx of back secondary to Ankylosing spondylitis back fx; Agrees to weight loss  Mentioned the [facility] or going to Dr. Leafy Ro at [place] [facility] and he may discuss with Dr. Elease Hashimoto    Referrals  To the [facility] loss center or Dr. Leafy Ro, as noted;   Patient concerns; Stopped lexapro, was not sleeping and felt it was not helping  Nurse Concerns; Drinks 4 shots per day; discussed 2 is the guideline. States he has 2 prior to dinner and one or two after dinner. This is normal in [place] where his family drank Educated on triglycerides, states they are down from his prior readings.    Given web site for the ankylosing spondylitis association for support    Can discuss benefit of pulmonary rehab with pulmonology Liked to exercise in the past  Next PCP apt 09/17      I have personally reviewed and noted the following in the patient's chart:   . Medical and social history . Use of alcohol, tobacco or illicit drugs  . Current medications and supplements . Functional ability and status . Nutritional status . Physical activity . Advanced directives . List of other physicians . Hospitalizations, surgeries, and ER visits in previous 12 months . Vitals . Screenings to include cognitive, depression, and falls . Referrals and appointments  In addition, I have reviewed and discussed with patient certain preventive protocols, quality metrics, and best practice recommendations. A written personalized care plan for preventive services as well as general preventive health recommendations were provided to patient.     WPVXY,IAXKP, RN   06/08/2017   Agree with assessment as above.  We are trying to get him to taper back ETOH and Lorazepam.  [name] MD [facility]

## 2017-06-10 ENCOUNTER — Encounter: Payer: Self-pay | Admitting: Family Medicine

## 2017-06-13 ENCOUNTER — Other Ambulatory Visit: Payer: Self-pay | Admitting: Family Medicine

## 2017-06-13 DIAGNOSIS — E785 Hyperlipidemia, unspecified: Secondary | ICD-10-CM

## 2017-06-13 DIAGNOSIS — E119 Type 2 diabetes mellitus without complications: Secondary | ICD-10-CM

## 2017-06-13 DIAGNOSIS — R5383 Other fatigue: Secondary | ICD-10-CM

## 2017-06-13 DIAGNOSIS — I10 Essential (primary) hypertension: Secondary | ICD-10-CM

## 2017-06-13 DIAGNOSIS — Z1159 Encounter for screening for other viral diseases: Secondary | ICD-10-CM

## 2017-06-15 ENCOUNTER — Other Ambulatory Visit (INDEPENDENT_AMBULATORY_CARE_PROVIDER_SITE_OTHER): Payer: Medicare Other

## 2017-06-15 DIAGNOSIS — E785 Hyperlipidemia, unspecified: Secondary | ICD-10-CM | POA: Diagnosis not present

## 2017-06-15 DIAGNOSIS — E119 Type 2 diabetes mellitus without complications: Secondary | ICD-10-CM

## 2017-06-15 DIAGNOSIS — I10 Essential (primary) hypertension: Secondary | ICD-10-CM | POA: Diagnosis not present

## 2017-06-15 DIAGNOSIS — R5383 Other fatigue: Secondary | ICD-10-CM | POA: Diagnosis not present

## 2017-06-15 DIAGNOSIS — Z1159 Encounter for screening for other viral diseases: Secondary | ICD-10-CM | POA: Diagnosis not present

## 2017-06-15 LAB — CBC WITH DIFFERENTIAL/PLATELET
Basophils Absolute: 0 10*3/uL (ref 0.0–0.1)
Basophils Relative: 0.3 % (ref 0.0–3.0)
EOS PCT: 1.8 % (ref 0.0–5.0)
Eosinophils Absolute: 0.1 10*3/uL (ref 0.0–0.7)
HEMATOCRIT: 43.6 % (ref 39.0–52.0)
Hemoglobin: 14.7 g/dL (ref 13.0–17.0)
LYMPHS ABS: 1.3 10*3/uL (ref 0.7–4.0)
Lymphocytes Relative: 23.3 % (ref 12.0–46.0)
MCHC: 33.8 g/dL (ref 30.0–36.0)
MCV: 101.1 fl — AB (ref 78.0–100.0)
MONOS PCT: 20.3 % — AB (ref 3.0–12.0)
Monocytes Absolute: 1.2 10*3/uL — ABNORMAL HIGH (ref 0.1–1.0)
NEUTROS ABS: 3.1 10*3/uL (ref 1.4–7.7)
NEUTROS PCT: 54.3 % (ref 43.0–77.0)
PLATELETS: 168 10*3/uL (ref 150.0–400.0)
RBC: 4.31 Mil/uL (ref 4.22–5.81)
RDW: 13.2 % (ref 11.5–15.5)
WBC: 5.7 10*3/uL (ref 4.0–10.5)

## 2017-06-15 LAB — LIPID PANEL
CHOLESTEROL: 187 mg/dL (ref 0–200)
HDL: 55.8 mg/dL (ref >39.00)
NonHDL: 130.9
TRIGLYCERIDES: 263 mg/dL — AB (ref 0.0–149.0)
Total CHOL/HDL Ratio: 3
VLDL: 52.6 mg/dL — ABNORMAL HIGH (ref 0.0–40.0)

## 2017-06-15 LAB — HEPATIC FUNCTION PANEL
ALBUMIN: 4.3 g/dL (ref 3.5–5.2)
ALT: 38 U/L (ref 0–53)
AST: 33 U/L (ref 0–37)
Alkaline Phosphatase: 27 U/L — ABNORMAL LOW (ref 39–117)
Bilirubin, Direct: 0.2 mg/dL (ref 0.0–0.3)
TOTAL PROTEIN: 6.8 g/dL (ref 6.0–8.3)
Total Bilirubin: 0.7 mg/dL (ref 0.2–1.2)

## 2017-06-15 LAB — BASIC METABOLIC PANEL
BUN: 15 mg/dL (ref 6–23)
CHLORIDE: 98 meq/L (ref 96–112)
CO2: 26 meq/L (ref 19–32)
CREATININE: 0.91 mg/dL (ref 0.40–1.50)
Calcium: 9.7 mg/dL (ref 8.4–10.5)
GFR: 88.33 mL/min (ref >60.00)
GLUCOSE: 141 mg/dL — AB (ref 70–99)
Potassium: 3.5 mEq/L (ref 3.5–5.1)
Sodium: 138 mEq/L (ref 135–145)

## 2017-06-15 LAB — HEMOGLOBIN A1C: Hgb A1c MFr Bld: 6.2 % (ref 4.6–6.5)

## 2017-06-15 LAB — PSA: PSA: 0.29 ng/mL (ref 0.10–4.00)

## 2017-06-15 LAB — TSH: TSH: 2.87 u[IU]/mL (ref 0.35–4.50)

## 2017-06-15 LAB — LDL CHOLESTEROL, DIRECT: Direct LDL: 107 mg/dL

## 2017-06-15 NOTE — Addendum Note (Signed)
Addended by: Tomi Likens on: 06/15/2017 08:54 AM   Modules accepted: Orders

## 2017-06-16 ENCOUNTER — Encounter: Payer: Self-pay | Admitting: Emergency Medicine

## 2017-06-16 LAB — HEPATITIS C ANTIBODY
Hepatitis C Ab: NONREACTIVE
SIGNAL TO CUT-OFF: 0.02 (ref <1.00)

## 2017-06-17 ENCOUNTER — Ambulatory Visit: Payer: Medicare Other | Admitting: Family Medicine

## 2017-06-20 ENCOUNTER — Other Ambulatory Visit: Payer: Self-pay | Admitting: Family Medicine

## 2017-06-20 ENCOUNTER — Ambulatory Visit (INDEPENDENT_AMBULATORY_CARE_PROVIDER_SITE_OTHER): Payer: Medicare Other | Admitting: Family Medicine

## 2017-06-20 ENCOUNTER — Encounter: Payer: Self-pay | Admitting: Family Medicine

## 2017-06-20 VITALS — BP 124/80 | HR 88 | Temp 98.2°F | Ht 70.0 in | Wt 263.5 lb

## 2017-06-20 DIAGNOSIS — I10 Essential (primary) hypertension: Secondary | ICD-10-CM

## 2017-06-20 DIAGNOSIS — E119 Type 2 diabetes mellitus without complications: Secondary | ICD-10-CM

## 2017-06-20 DIAGNOSIS — G2581 Restless legs syndrome: Secondary | ICD-10-CM | POA: Diagnosis not present

## 2017-06-20 DIAGNOSIS — J449 Chronic obstructive pulmonary disease, unspecified: Secondary | ICD-10-CM | POA: Diagnosis not present

## 2017-06-20 DIAGNOSIS — E785 Hyperlipidemia, unspecified: Secondary | ICD-10-CM | POA: Diagnosis not present

## 2017-06-20 MED ORDER — LORAZEPAM 0.5 MG PO TABS: 0.5000 mg | ORAL_TABLET | Freq: Three times a day (TID) | ORAL | 2 refills | Status: DC | PRN

## 2017-06-20 MED ORDER — PRAMIPEXOLE DIHYDROCHLORIDE 0.25 MG PO TABS: 0.2500 mg | ORAL_TABLET | Freq: Every day | ORAL | 3 refills | Status: DC

## 2017-06-20 MED ORDER — FENOFIBRATE 160 MG PO TABS: 160.0000 mg | ORAL_TABLET | Freq: Every day | ORAL | 3 refills | Status: DC

## 2017-06-20 NOTE — Progress Notes (Signed)
Subjective:     Patient ID: Adam [name], male   DOB: [birth year], 66 y.o.   MRN: [id number]  HPI Here for follow-up regarding multiple chronic medical problems. He has history of CAD, type 2 diabetes, restless leg syndrome, posttraumatic stress disorder, obesity, gout, GERD, hypertension, dyslipidemia. He had recent labs which were reviewed today. His PSA was normal. A1c 6.2%. No function stable. Liver functions normal. Triglycerides 263 but HDL improved at 55. LDL 107. Hepatitis C antibody negative.  Generally doing fairly well. He's had some issues with back difficulties which of inhibited regular cycling. He is in process of trying to get recumbent bicycle. Needs refills of Mirapex and fenofibrate. Denies any medication side effects or concerns. Compliant with all medications.  He has history of some chronic anxiety and came to [place] on lorazepam. We've been trying to reduce his dosage.  Past Medical History:  Diagnosis Date  . Allergy   . Anxiety    history of PTSD following CABG  . Arthritis   . Asthma   . Colitis- colonoscopy 2014 07/13/2015  . COPD (chronic obstructive pulmonary disease) ([place])   . Coronary artery disease    x 6  . Depression   . Family history of polyps in the colon   . GERD (gastroesophageal reflux disease)   . Gout   . H/O atrial fibrillation without current medication    following CABG with no documented episodes since then.  Marland Kitchen Hx of adenomatous colonic polyps 08/12/2010  . Hyperlipidemia   . Hypertension   . Prediabetes   . RLS (restless legs syndrome)   . 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 APPENDECTOMY    . COLONOSCOPY W/ BIOPSIES    . CORONARY ANGIOPLASTY WITH STENT PLACEMENT    . CORONARY ARTERY BYPASS GRAFT    . TOTAL HIP ARTHROPLASTY      reports that he quit smoking about 39 years ago. His smoking use included Cigarettes. He has a 7.00 pack-year smoking history. He has never used  smokeless tobacco. He reports that he drinks alcohol. He reports that he does not use drugs. family history includes Colon cancer in his mother; Heart disease in his paternal grandfather and paternal grandmother. Allergies  Allergen Reactions  . Clindamycin/Lincomycin   . Keflex [Cephalexin]     rash     Review of Systems  Constitutional: Negative for fatigue and unexpected weight change.  Eyes: Negative for visual disturbance.  Respiratory: Negative for cough, chest tightness and shortness of breath.   Cardiovascular: Negative for chest pain, palpitations and leg swelling.  Endocrine: Negative for polydipsia and polyuria.  Neurological: Negative for dizziness, syncope, weakness, light-headedness and headaches.       Objective:   Physical Exam  Constitutional: He is oriented to person, place, and time. He appears well-developed and well-nourished.  HENT:  Right Ear: External ear normal.  Left Ear: External ear normal.  Mouth/Throat: Oropharynx is clear and moist.  Eyes: Pupils are equal, round, and reactive to light.  Neck: Neck supple. No thyromegaly present.  Cardiovascular: Normal rate and regular rhythm.   Pulmonary/Chest: Effort normal and breath sounds normal. No respiratory distress. He has no wheezes. He has no rales.  Musculoskeletal: He exhibits no edema.  Neurological: He is alert and oriented to person, place, and time.       Assessment:     #1 hypertension stable and at goal  #2 dyslipidemia  #3  restless leg syndrome  #4 type 2 diabetes well controlled  #5 history of CAD  #6 COPD    Plan:     -flu vaccine already given -Refilled fenofibrate and Mirapex for one year -Continue weight loss efforts -Follow-up in 6 months and repeat A1c then  [name] MD [facility]

## 2017-06-23 ENCOUNTER — Encounter: Payer: Self-pay | Admitting: Family Medicine

## 2017-06-29 DIAGNOSIS — D044 Carcinoma in situ of skin of scalp and neck: Secondary | ICD-10-CM | POA: Diagnosis not present

## 2017-06-30 ENCOUNTER — Other Ambulatory Visit: Payer: Self-pay | Admitting: Family Medicine

## 2017-07-04 DIAGNOSIS — E119 Type 2 diabetes mellitus without complications: Secondary | ICD-10-CM | POA: Diagnosis not present

## 2017-07-11 ENCOUNTER — Other Ambulatory Visit: Payer: Self-pay | Admitting: Family Medicine

## 2017-07-15 DIAGNOSIS — Z951 Presence of aortocoronary bypass graft: Secondary | ICD-10-CM | POA: Diagnosis not present

## 2017-07-15 DIAGNOSIS — J45909 Unspecified asthma, uncomplicated: Secondary | ICD-10-CM | POA: Diagnosis not present

## 2017-07-15 DIAGNOSIS — I10 Essential (primary) hypertension: Secondary | ICD-10-CM | POA: Diagnosis not present

## 2017-07-15 DIAGNOSIS — I2510 Atherosclerotic heart disease of native coronary artery without angina pectoris: Secondary | ICD-10-CM | POA: Diagnosis not present

## 2017-07-24 ENCOUNTER — Other Ambulatory Visit: Payer: Self-pay | Admitting: Family Medicine

## 2017-07-26 DIAGNOSIS — L570 Actinic keratosis: Secondary | ICD-10-CM | POA: Diagnosis not present

## 2017-07-26 DIAGNOSIS — L814 Other melanin hyperpigmentation: Secondary | ICD-10-CM | POA: Diagnosis not present

## 2017-07-26 DIAGNOSIS — Z85828 Personal history of other malignant neoplasm of skin: Secondary | ICD-10-CM | POA: Diagnosis not present

## 2017-07-26 DIAGNOSIS — D485 Neoplasm of uncertain behavior of skin: Secondary | ICD-10-CM | POA: Diagnosis not present

## 2017-08-09 ENCOUNTER — Other Ambulatory Visit: Payer: Self-pay | Admitting: Family Medicine

## 2017-08-16 DIAGNOSIS — L570 Actinic keratosis: Secondary | ICD-10-CM | POA: Diagnosis not present

## 2017-08-16 DIAGNOSIS — L218 Other seborrheic dermatitis: Secondary | ICD-10-CM | POA: Diagnosis not present

## 2017-08-19 DIAGNOSIS — E119 Type 2 diabetes mellitus without complications: Secondary | ICD-10-CM | POA: Diagnosis not present

## 2017-08-29 ENCOUNTER — Other Ambulatory Visit: Payer: Self-pay | Admitting: Family Medicine

## 2017-09-04 LAB — HM DIABETES EYE EXAM

## 2017-09-06 ENCOUNTER — Encounter: Payer: Self-pay | Admitting: Emergency Medicine

## 2017-09-06 NOTE — Telephone Encounter (Signed)
Called and spoke with pt and stated to pt we should get him scheduled for a visit. Pt expressed understanding. appt made with Dr. Lamonte Sakai Thursday, 09/08/17 at 10:45. Nothing further needed.

## 2017-09-08 ENCOUNTER — Encounter: Payer: Self-pay | Admitting: Emergency Medicine

## 2017-09-08 ENCOUNTER — Ambulatory Visit: Payer: Medicare Other | Admitting: Emergency Medicine

## 2017-09-08 DIAGNOSIS — J449 Chronic obstructive pulmonary disease, unspecified: Secondary | ICD-10-CM | POA: Diagnosis not present

## 2017-09-08 DIAGNOSIS — J209 Acute bronchitis, unspecified: Secondary | ICD-10-CM

## 2017-09-08 MED ORDER — AZITHROMYCIN 250 MG PO TABS: ORAL_TABLET | ORAL | 0 refills | Status: AC

## 2017-09-08 NOTE — Assessment & Plan Note (Signed)
Has been fairly stable although he does have some exertional limitation.  No active wheezing on exam despite his upper respiratory infection.  I believe that this is more of a bronchitis than an episode of bronchospasm or full COPD exacerbation.   Continue your Spiriva and Memory Dance as you have been taking them Continue Singulair as you have been taking it Keep albuterol available to use 2 puffs if needed for shortness of breath, wheezing, chest tightness Follow with Dr Lamonte Sakai in 6 months or sooner if you have any problems

## 2017-09-08 NOTE — Progress Notes (Signed)
   Subjective:    Patient ID: Adam [name], male    DOB: May 06, 1951, 66 y.o.   MRN: [id number]  HPI  Acute OV 09/08/17 --66 year old gentleman with COPD with asthmatic features, chronic cough, chronic rhinitis.  Also with a history of hypertension, CAD/CABG, restless leg syndrome. He developed URI sx about 10 days ago. He has clear nasal drainage, cough that may be lightening up some, prod of green/yellow. He has had some associated dyspnea.  Flu shot is up-to-date.  He has benefited from over-the-counter decongestants.  The biggest change is been the color of his mucus   No flowsheet data found.  Review of Systems As per hpi      Objective:   Physical Exam Vitals:   09/08/17 1055  BP: 122/88  Pulse: 98  SpO2: 96%  Weight: 262 lb (118.8 kg)  Height: 5\' 10"  (1.778 m)   Gen: Pleasant, overwt man, in no distress,  normal affect  ENT: No lesions,  mouth clear,  oropharynx clear, some apparent nasal congestion, no active drip  Neck: No JVD, no stridor  Lungs: No use of accessory muscles, clear without rales or rhonchi, no wheezing today  Cardiovascular: RRR, heart sounds normal, no murmur or gallops, no peripheral edema  Musculoskeletal: No deformities, no cyanosis or clubbing  Neuro: alert, non focal  Skin: Warm, no lesions or rashes      Assessment & Plan:  COPD (chronic obstructive pulmonary disease) Has been fairly stable although he does have some exertional limitation.  No active wheezing on exam despite his upper respiratory infection.  I believe that this is more of a bronchitis than an episode of bronchospasm or full COPD exacerbation.   Continue your Spiriva and Breo as you have been taking them Continue Singulair as you have been taking it Keep albuterol available to use 2 puffs if needed for shortness of breath, wheezing, chest tightness Follow with Dr Lamonte Sakai in 6 months or sooner if you have any problems  Acute bronchitis He has been improving overall  following a viral URI but now developing purulent sputum and some increase in his sputum.  I suspect that this is a superimposed acute bacterial bronchitis.  We will treat him with azithromycin and follow for resolution.  He knows to call if you not getting better  Baltazar Apo, MD, PhD 09/08/2017, 11:11 AM [facility] [phone] or if no answer [phone]

## 2017-09-08 NOTE — Assessment & Plan Note (Addendum)
He has been improving overall following a viral URI but now developing purulent sputum and some increase in his sputum.  I suspect that this is a superimposed acute bacterial bronchitis.  We will treat him with azithromycin and follow for resolution.  He knows to call if you not getting better

## 2017-09-08 NOTE — Patient Instructions (Addendum)
Take azithromycin as directed until completely gone Continue your Spiriva and Breo as you have been taking them Continue Singulair as you have been taking it Keep albuterol available to use 2 puffs if needed for shortness of breath, wheezing, chest tightness Continue to use your over-the-counter cold medications for symptom management Follow with Dr Lamonte Sakai in 6 months or sooner if you have any problems

## 2017-09-11 ENCOUNTER — Other Ambulatory Visit: Payer: Self-pay | Admitting: Family Medicine

## 2017-09-12 ENCOUNTER — Encounter: Payer: Self-pay | Admitting: Emergency Medicine

## 2017-09-14 NOTE — Telephone Encounter (Signed)
Please have him finish the antibiotics so that we can assess his symptoms after a full course.  Have him stay in close communication with [place] so that we can see him back and plan appropriate workup or treatment if if he does not improve or certainly if he back slides.

## 2017-09-15 ENCOUNTER — Encounter: Payer: Self-pay | Admitting: Emergency Medicine

## 2017-09-15 NOTE — Telephone Encounter (Signed)
Patient is calling to schedule an appointment with Dr. Lamonte Sakai.  States he is feeling some better today but is not where he feels he should be.  Dr. Agustina Caroli first opening is 01/10 and patient is requesting to see him today.  CB is [phone].

## 2017-09-15 NOTE — Telephone Encounter (Signed)
Spoke with pt, advised that [facility] did not have any openings today, but offered an appt this afternoon with TP.  Pt declined, stating that he is improved since yesterday and the day before, but advised that he would touch base with our office before the weekend if his s/s worsen or do not continue to improve.

## 2017-09-16 ENCOUNTER — Other Ambulatory Visit: Payer: Self-pay | Admitting: Family Medicine

## 2017-09-16 NOTE — Telephone Encounter (Signed)
Last refill given at office visit 06/20/17.  Okay to refill?

## 2017-09-18 NOTE — Telephone Encounter (Signed)
May refill.  I would like for him to try to reduce this down to BID

## 2017-09-19 ENCOUNTER — Encounter: Payer: Self-pay | Admitting: Family Medicine

## 2017-09-19 DIAGNOSIS — D485 Neoplasm of uncertain behavior of skin: Secondary | ICD-10-CM | POA: Diagnosis not present

## 2017-09-19 DIAGNOSIS — L821 Other seborrheic keratosis: Secondary | ICD-10-CM | POA: Diagnosis not present

## 2017-09-19 DIAGNOSIS — L570 Actinic keratosis: Secondary | ICD-10-CM | POA: Diagnosis not present

## 2017-09-30 LAB — HM DIABETES EYE EXAM

## 2017-10-01 ENCOUNTER — Encounter: Payer: Self-pay | Admitting: Emergency Medicine

## 2017-10-03 DIAGNOSIS — E119 Type 2 diabetes mellitus without complications: Secondary | ICD-10-CM | POA: Diagnosis not present

## 2017-10-03 MED ORDER — DOXYCYCLINE HYCLATE 100 MG PO TABS: 100.0000 mg | ORAL_TABLET | Freq: Two times a day (BID) | ORAL | 0 refills | Status: DC

## 2017-10-03 NOTE — Addendum Note (Signed)
Addended by: Rosana Berger on: 10/03/2017 12:22 PM   Modules accepted: Orders

## 2017-10-03 NOTE — Telephone Encounter (Addendum)
Pt was on zpak 12/6-12/10 to help treat poss. bronchitis. Pt states that he is still having persistent symptoms including coughing mainly in the morning but also some in the afternoon and getting up green mucus, fatigue, pain when inhaling, and also wheezing at times.  Dr. Annamaria Boots, please advise if there is anything else we could prescribe for pt or what we should do.  Thanks!  Allergies  Allergen Reactions  . Clindamycin/Lincomycin   . Keflex [Cephalexin]     rash     Current Outpatient Medications:  .  albuterol (PROAIR HFA) 108 (90 BASE) MCG/ACT inhaler, Inhale 1-2 puffs into the lungs every 6 (six) hours as needed for wheezing or shortness of breath., Disp: 8 g, Rfl: 5 .  allopurinol (ZYLOPRIM) 300 MG tablet, TAKE 1 TABLET BY MOUTH EVERY DAY, Disp: 90 tablet, Rfl: 0 .  aspirin 81 MG tablet, Take 81 mg by mouth daily., Disp: , Rfl:  .  BREO ELLIPTA 100-25 MCG/INH AEPB, INHALE 1 PUFF BY MOUTH EVERY DAY, Disp: 60 each, Rfl: 2 .  Coenzyme Q10 (CO Q-10) 100 MG CAPS, Take 2 tablets by mouth daily., Disp: , Rfl:  .  fenofibrate 160 MG tablet, Take 1 tablet (160 mg total) by mouth daily., Disp: 90 tablet, Rfl: 3 .  fluticasone (FLONASE) 50 MCG/ACT nasal spray, Place 1 spray into the nose daily. , Disp: , Rfl:  .  glucose blood (ONETOUCH VERIO) test strip, Test glucose once daily E11.9, Disp: 100 each, Rfl: 3 .  Insulin Pen Needle (BD PEN NEEDLE NANO U/F) 32G X 4 MM MISC, Use as instructed, Disp: 100 each, Rfl: 3 .  LORazepam (ATIVAN) 0.5 MG tablet, TAKE 1 TABLET BY MOUTH EVERY 8 HOURS AS NEEDED FOR ANXIETY, Disp: 90 tablet, Rfl: 0 .  losartan-hydrochlorothiazide (HYZAAR) 100-25 MG tablet, TAKE 1 TABLET BY MOUTH EVERY DAY, Disp: 90 tablet, Rfl: 0 .  metFORMIN (GLUCOPHAGE) 1000 MG tablet, TAKE 1 TABLET BY MOUTH TWICE DAILY WITH A MEAL, Disp: 180 tablet, Rfl: 0 .  metoprolol (LOPRESSOR) 100 MG tablet, Take 100 mg by mouth 2 (two) times daily., Disp: , Rfl:  .  montelukast (SINGULAIR) 10 MG tablet,  TAKE 1 TABLET BY MOUTH AT BEDTIME, Disp: 90 tablet, Rfl: 0 .  Olopatadine HCl (PATADAY) 0.2 % SOLN, Apply 1 drop to eye as needed., Disp: 2.5 mL, Rfl: 5 .  ONETOUCH DELICA LANCETS FINE MISC, 1 Device by Other route as needed. Use as instructed.  DX: 250.00, Disp: 100 each, Rfl: 3 .  pantoprazole (PROTONIX) 40 MG tablet, Take 1 tablet (40 mg total) by mouth 2 (two) times daily., Disp: 180 tablet, Rfl: 1 .  pramipexole (MIRAPEX) 0.25 MG tablet, Take 1 tablet (0.25 mg total) by mouth at bedtime., Disp: 90 tablet, Rfl: 3 .  simvastatin (ZOCOR) 80 MG tablet, TAKE 1 TABLET BY MOUTH EVERY DAY, Disp: 90 tablet, Rfl: 2 .  SPIRIVA HANDIHALER 18 MCG inhalation capsule, INHALE CONTENTS OF 1 CAPSULE ONCE DAILY USING HANDIHALER, Disp: 90 capsule, Rfl: 0 .  TOUJEO SOLOSTAR 300 UNIT/ML SOPN, INJECT 80 UNITS INTO THE SKIN AS DIRECTED, Disp: 4.5 pen, Rfl: 3

## 2017-10-03 NOTE — Telephone Encounter (Signed)
Offer doxycycline 100 mg, # 14, 1 twice daily 

## 2017-10-06 ENCOUNTER — Encounter: Payer: Self-pay | Admitting: Family Medicine

## 2017-10-11 ENCOUNTER — Other Ambulatory Visit: Payer: Self-pay | Admitting: Family Medicine

## 2017-10-11 ENCOUNTER — Encounter: Payer: Self-pay | Admitting: Emergency Medicine

## 2017-10-11 ENCOUNTER — Telehealth: Payer: Self-pay

## 2017-10-11 MED ORDER — AMOXICILLIN-POT CLAVULANATE 875-125 MG PO TABS: 1.0000 | ORAL_TABLET | Freq: Two times a day (BID) | ORAL | 0 refills | Status: DC

## 2017-10-11 MED ORDER — PREDNISONE 10 MG PO TABS: ORAL_TABLET | ORAL | 0 refills | Status: DC

## 2017-10-11 NOTE — Telephone Encounter (Signed)
He shouuld consider doing prednisone and might be reason why he is not better from copd/asthma flare  Plan Please take prednisone 40 mg x1 day, then 30 mg x1 day, then 20 mg x1 day, then 10 mg x1 day, and then 5 mg x1 day and stop  And if he is very keen can do - augmentin 875mg  twice daily x 5 days but let him know 3rd antibiotic cycle in such a short perioid could mess up his gut  Dr. Brand Males, M.D., [facility].C.P Pulmonary and Critical Care Medicine Staff Physician, [place] Director - Interstitial Lung Disease  Program  Pulmonary [place] [facility] [place], [place], [address]  Pager: [phone], If no answer or between  15:00h - 7:00h: call [phone] Telephone: [phone]

## 2017-10-11 NOTE — Telephone Encounter (Signed)
Pt emailed back and verified pharmacy. I sent the Rx;s in to [facility]. Nothing further is needed.

## 2017-10-11 NOTE — Telephone Encounter (Signed)
Message from [place], Generic sent at 10/11/2017 11:24 AM EST -----    HI Dr. Lamonte Sakai,  Not sure if or when you return to the office but I am still quite ill. I am starting week # 8 which began as bronchitis, took Z Pack, improved somewhat but not enough.  Dr. Annamaria Boots prescribed doxycycline hyclate 100 mg for 7 days which I finished yesterday morning. Thought I was getting better but no such luck. The last two days I awoke with sore throats and brought up lots of green mucus that eventually turned clear after an hour or so. Still coughing, chest still hurts when inhaling, and very difficult to stay awake, seems like all the signs of a sinus infection. I have not had one in a very long time but I remember that I used to take Augumentin for it. The only thing I know I can do is sleep.  Adam [name]   Can DOD review, RB is not here.

## 2017-10-11 NOTE — Telephone Encounter (Signed)
I emailed pt to verify his pharmacy. I will await a response back. I believe it is the [facility] in [place].

## 2017-10-17 ENCOUNTER — Encounter: Payer: Self-pay | Admitting: Adult Health

## 2017-10-17 ENCOUNTER — Ambulatory Visit (INDEPENDENT_AMBULATORY_CARE_PROVIDER_SITE_OTHER)
Admission: RE | Admit: 2017-10-17 | Discharge: 2017-10-17 | Disposition: A | Discharge status: Home / Self Care | Payer: Medicare Other | Source: Ambulatory Visit | Attending: Adult Health | Admitting: Adult Health

## 2017-10-17 ENCOUNTER — Other Ambulatory Visit (INDEPENDENT_AMBULATORY_CARE_PROVIDER_SITE_OTHER): Payer: Medicare Other

## 2017-10-17 ENCOUNTER — Ambulatory Visit: Payer: Medicare Other | Admitting: Adult Health

## 2017-10-17 ENCOUNTER — Other Ambulatory Visit: Payer: Self-pay | Admitting: Adult Health

## 2017-10-17 DIAGNOSIS — R05 Cough: Secondary | ICD-10-CM | POA: Diagnosis not present

## 2017-10-17 DIAGNOSIS — J209 Acute bronchitis, unspecified: Secondary | ICD-10-CM | POA: Diagnosis not present

## 2017-10-17 DIAGNOSIS — J441 Chronic obstructive pulmonary disease with (acute) exacerbation: Secondary | ICD-10-CM

## 2017-10-17 DIAGNOSIS — J4531 Mild persistent asthma with (acute) exacerbation: Secondary | ICD-10-CM | POA: Diagnosis not present

## 2017-10-17 DIAGNOSIS — I517 Cardiomegaly: Secondary | ICD-10-CM

## 2017-10-17 HISTORY — DX: Cardiomegaly: I51.7

## 2017-10-17 LAB — NITRIC OXIDE: Nitric Oxide: 7

## 2017-10-17 MED ORDER — LEVALBUTEROL HCL 0.63 MG/3ML IN NEBU
0.6300 mg | INHALATION_SOLUTION | Freq: Once | RESPIRATORY_TRACT | Status: AC
  Administered 2017-10-17: 0.63 mg via RESPIRATORY_TRACT

## 2017-10-17 MED ORDER — FLUTICASONE FUROATE-VILANTEROL 200-25 MCG/INH IN AEPB: 1.0000 | INHALATION_SPRAY | Freq: Every day | RESPIRATORY_TRACT | 0 refills | Status: DC

## 2017-10-17 MED ORDER — FUROSEMIDE 20 MG PO TABS: 20.0000 mg | ORAL_TABLET | Freq: Every day | ORAL | 0 refills | Status: DC

## 2017-10-17 MED ORDER — POTASSIUM CHLORIDE CRYS ER 20 MEQ PO TBCR: 20.0000 meq | EXTENDED_RELEASE_TABLET | Freq: Every day | ORAL | 0 refills | Status: DC

## 2017-10-17 NOTE — Patient Instructions (Addendum)
Increase BREO 200  1puff daily .  Mucinex DM Twice daily As needed  Cough /congestion .  Chest xray today .  Labs today .  Lasix 20mg  daily for 3 days (with potassium )  Follow up with Dr. Lamonte Sakai  In 1 week and As needed   Please contact office for sooner follow up if symptoms do not improve or worsen or seek emergency care

## 2017-10-17 NOTE — Progress Notes (Signed)
@Patient  ID: Adam [name], male    DOB: 05/22/1951, 67 y.o.   MRN: [id number]  Chief Complaint  Patient presents with  . Acute Visit    COPD     Referring provider: [name], MD  HPI: 67 year old male former smoker followed for COPD with asthma, chronic cough and chronic rhinitis. Past medical history positive for coronary disease and CABG  10/17/2017 Acute OV : COPD  Patient presents for an acute office visit.  Patient complains over the last 4 weeks of persistent cough congestion wheezing and shortness of breath.  Patient was initially seen 1 month ago.  Given a Z-Pak.  Felt to have an upper respiratory infection.  Patient says he had minimum improvement in symptoms.. Patient says that his symptoms continue to linger.  He called back in the office on December 29 and was called in doxycycline for 1 week.  Symptoms continue to persist.  Patient was called in Augmentin and prednisone on January 8.  Patient says he is feeling better but continues to have intermittent shortness of breath and wheezing.  Congestion is resolved . While on prednisone was less dyspnea and wheezing . He gets very winded with walking.  Denies any fever hemoptysis chest pain orthopnea or increased leg swelling. Exhaled nitric oxide testing today is normal at 6 ppb.  Wt is up 7 lbs .      Allergies  Allergen Reactions  . Clindamycin/Lincomycin   . Keflex [Cephalexin]     rash    Immunization History  Administered Date(s) Administered  . Influenza Split 07/04/2012  . Influenza, High Dose Seasonal PF 05/28/2016, 06/08/2017  . Influenza,inj,Quad PF,6+ Mos 07/05/2013, 05/22/2014, 06/17/2015  . Pneumococcal Conjugate-13 12/01/2015  . Pneumococcal Polysaccharide-23 10/05/2006, 11/30/2016  . Td 10/06/2009  . Zoster 08/21/2013  . Zoster Recombinat (Shingrix) 12/02/2016, 04/02/2017    Past Medical History:  Diagnosis Date  . Allergy   . Anxiety    history of PTSD following CABG  . Arthritis     . Asthma   . Colitis- colonoscopy 2014 07/13/2015  . COPD (chronic obstructive pulmonary disease) ([place])   . Coronary artery disease    x 6  . Depression   . Family history of polyps in the colon   . GERD (gastroesophageal reflux disease)   . Gout   . H/O atrial fibrillation without current medication    following CABG with no documented episodes since then.  Marland Kitchen Hx of adenomatous colonic polyps 08/12/2010  . Hyperlipidemia   . Hypertension   . Prediabetes   . RLS (restless legs syndrome)   . 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 Packs/day: 1.00  . Years: 7.00  . Pack years: 7.00  . Types: Cigarettes  . Last attempt to quit: 10/04/1977  . Years since quitting: 40.0  Smokeless Tobacco Never Used  Tobacco Comment   never smoked over 1 pack    Counseling given: Not Answered Comment: never smoked over 1 pack    Outpatient Encounter Medications as of 10/17/2017  Medication Sig  . albuterol (PROAIR HFA) 108 (90 BASE) MCG/ACT inhaler Inhale 1-2 puffs into the lungs every 6 (six) hours as needed for wheezing or shortness of breath.  . allopurinol (ZYLOPRIM) 300 MG tablet TAKE 1 TABLET BY MOUTH EVERY DAY  . aspirin 81 MG tablet Take 81 mg by mouth daily.  Marland Kitchen BREO ELLIPTA  100-25 MCG/INH AEPB INHALE 1 PUFF BY MOUTH EVERY DAY  . Coenzyme Q10 (CO Q-10) 100 MG CAPS Take 2 tablets by mouth daily.  . fenofibrate 160 MG tablet Take 1 tablet (160 mg total) by mouth daily.  . fluticasone (FLONASE) 50 MCG/ACT nasal spray Place 1 spray into the nose daily.   Marland Kitchen glucose blood (ONETOUCH VERIO) test strip Test glucose once daily E11.9  . Insulin Pen Needle (BD PEN NEEDLE NANO U/F) 32G X 4 MM MISC Use as instructed  . LORazepam (ATIVAN) 0.5 MG tablet TAKE 1 TABLET BY MOUTH EVERY 8 HOURS AS NEEDED FOR ANXIETY  . losartan-hydrochlorothiazide (HYZAAR) 100-25 MG tablet TAKE 1 TABLET BY MOUTH EVERY DAY  .  metFORMIN (GLUCOPHAGE) 1000 MG tablet TAKE 1 TABLET BY MOUTH TWICE DAILY WITH A MEAL  . metoprolol (LOPRESSOR) 100 MG tablet Take 100 mg by mouth 2 (two) times daily.  . montelukast (SINGULAIR) 10 MG tablet TAKE 1 TABLET BY MOUTH AT BEDTIME  . Olopatadine HCl (PATADAY) 0.2 % SOLN Apply 1 drop to eye as needed.  Glory Rosebush DELICA LANCETS FINE MISC 1 Device by Other route as needed. Use as instructed.  DX: 250.00  . pantoprazole (PROTONIX) 40 MG tablet Take 1 tablet (40 mg total) by mouth 2 (two) times daily.  . pramipexole (MIRAPEX) 0.25 MG tablet Take 1 tablet (0.25 mg total) by mouth at bedtime.  . simvastatin (ZOCOR) 80 MG tablet TAKE 1 TABLET BY MOUTH EVERY DAY  . SPIRIVA HANDIHALER 18 MCG inhalation capsule INHALE CONTENTS OF 1 CAPSULE ONCE DAILY USING HANDIHALER  . TOUJEO SOLOSTAR 300 UNIT/ML SOPN INJECT 80 UNITS INTO THE SKIN AS DIRECTED  . furosemide (LASIX) 20 MG tablet Take 1 tablet (20 mg total) by mouth daily.  . potassium chloride SA (KLOR-CON M20) 20 MEQ tablet Take 1 tablet (20 mEq total) by mouth daily.  . [DISCONTINUED] amoxicillin-clavulanate (AUGMENTIN) 875-125 MG tablet Take 1 tablet by mouth 2 (two) times daily. (Patient not taking: Reported on 10/17/2017)  . [DISCONTINUED] doxycycline (VIBRA-TABS) 100 MG tablet Take 1 tablet (100 mg total) by mouth 2 (two) times daily. (Patient not taking: Reported on 10/17/2017)  . [DISCONTINUED] predniSONE (DELTASONE) 10 MG tablet Take 4 tabs for 1 day, then 3 tabs for 1 day, 2 tabs for 1 days, then 1 tab for 1 day,  then stop. (Patient not taking: Reported on 10/17/2017)   No facility-administered encounter medications on file as of 10/17/2017.      Review of Systems  Constitutional:   No  weight loss, night sweats,  Fevers, chills,  +fatigue, or  lassitude.  HEENT:   No headaches,  Difficulty swallowing,  Tooth/dental problems, or  Sore throat,                No sneezing, itching, ear ache,  +nasal congestion, post nasal drip,   CV:   No chest pain,  Orthopnea, PND, swelling in lower extremities, anasarca, dizziness, palpitations, syncope.   GI  No heartburn, indigestion, abdominal pain, nausea, vomiting, diarrhea, change in bowel habits, loss of appetite, bloody stools.   Resp:    No chest wall deformity  Skin: no rash or lesions.  GU: no dysuria, change in color of urine, no urgency or frequency.  No flank pain, no hematuria   MS:  No joint pain or swelling.  No decreased range of motion.  No back pain.    Physical Exam  BP 132/84 (BP Location: Left Arm, Cuff Size: Normal)   Ht  5\' 10"  (1.778 m)   Wt 269 lb 12.8 oz (122.4 kg)   SpO2 96%   BMI 38.71 kg/m   GEN: A/Ox3; pleasant , NAD, obese    HEENT:  [place]/AT,  EACs-clear, TMs-wnl, NOSE-clear, THROAT-clear, no lesions, no postnasal drip or exudate noted.   NECK:  Supple w/ fair ROM; no JVD; normal carotid impulses w/o bruits; no thyromegaly or nodules palpated; no lymphadenopathy.    RESP  Clear  P & A; w/o, wheezes/ rales/ or rhonchi. no accessory muscle use, no dullness to percussion  CARD:  RRR, no m/r/g, 1+ peripheral edema, pulses intact, no cyanosis or clubbing.  GI:   Soft & nt; nml bowel sounds; no organomegaly or masses detected.   Musco: Warm bil, no deformities or joint swelling noted.   Neuro: alert, no focal deficits noted.    Skin: Warm, no lesions or rashes    Lab Results:  CBC    Component Value Date/Time   WBC 5.7 06/15/2017 1005   RBC 4.31 06/15/2017 1005   HGB 14.7 06/15/2017 1005   HCT 43.6 06/15/2017 1005   PLT 168.0 06/15/2017 1005   MCV 101.1 (H) 06/15/2017 1005   MCHC 33.8 06/15/2017 1005   RDW 13.2 06/15/2017 1005   LYMPHSABS 1.3 06/15/2017 1005   MONOABS 1.2 (H) 06/15/2017 1005   EOSABS 0.1 06/15/2017 1005   BASOSABS 0.0 06/15/2017 1005    BMET    Component Value Date/Time   NA 138 06/15/2017 1005   K 3.5 06/15/2017 1005   CL 98 06/15/2017 1005   CO2 26 06/15/2017 1005   GLUCOSE 141 (H) 06/15/2017 1005    BUN 15 06/15/2017 1005   CREATININE 0.91 06/15/2017 1005   CALCIUM 9.7 06/15/2017 1005    BNP No results found for: BNP  ProBNP No results found for: PROBNP  Imaging: No results found.   Assessment & Plan:   COPD (chronic obstructive pulmonary disease) Slow to resolve exacerbation s/p 3 abx and 1 steroid taper  Hold on additional abx /steroids for now  xopenex neb  Check cxr and labs with BNP  Mild diuresis with lasix x 3 days in case volume overload is playing role in ongoing sx w/ dyspnea  Check bnp  Bump up ICS inhaler   Plan Patient Instructions  Increase BREO 200  1puff daily .  Mucinex DM Twice daily As needed  Cough /congestion .  Chest xray today .  Labs today .  Lasix 20mg  daily for 3 days (with potassium )  Follow up with Dr. Lamonte Sakai  In 1 week and As needed   Please contact office for sooner follow up if symptoms do not improve or worsen or seek emergency care       Acute bronchitis Ongoing sx despite abx  Check cxr today   Asthma, mild persistent Increase BREO 200 1 puff daily.      Rexene Edison, NP 10/17/2017

## 2017-10-17 NOTE — Assessment & Plan Note (Signed)
Increase BREO 200 1 puff daily.

## 2017-10-17 NOTE — Assessment & Plan Note (Signed)
Ongoing sx despite abx  Check cxr today

## 2017-10-17 NOTE — Addendum Note (Signed)
Addended by: Parke Poisson E on: 10/17/2017 05:31 PM   Modules accepted: Orders

## 2017-10-17 NOTE — Assessment & Plan Note (Signed)
Slow to resolve exacerbation s/p 3 abx and 1 steroid taper  Hold on additional abx /steroids for now  xopenex neb  Check cxr and labs with BNP  Mild diuresis with lasix x 3 days in case volume overload is playing role in ongoing sx w/ dyspnea  Check bnp  Bump up ICS inhaler   Plan Patient Instructions  Increase BREO 200  1puff daily .  Mucinex DM Twice daily As needed  Cough /congestion .  Chest xray today .  Labs today .  Lasix 20mg  daily for 3 days (with potassium )  Follow up with Dr. Lamonte Sakai  In 1 week and As needed   Please contact office for sooner follow up if symptoms do not improve or worsen or seek emergency care

## 2017-10-18 ENCOUNTER — Other Ambulatory Visit: Payer: Self-pay | Admitting: Adult Health

## 2017-10-18 ENCOUNTER — Encounter: Payer: Self-pay | Admitting: Adult Health

## 2017-10-18 ENCOUNTER — Telehealth: Payer: Self-pay | Admitting: Adult Health

## 2017-10-18 ENCOUNTER — Other Ambulatory Visit: Payer: Self-pay | Admitting: Family Medicine

## 2017-10-18 DIAGNOSIS — Z8679 Personal history of other diseases of the circulatory system: Secondary | ICD-10-CM

## 2017-10-18 LAB — CBC WITH DIFFERENTIAL/PLATELET
BASOS ABS: 0.1 10*3/uL (ref 0.0–0.1)
Basophils Relative: 0.6 % (ref 0.0–3.0)
EOS ABS: 0.1 10*3/uL (ref 0.0–0.7)
Eosinophils Relative: 1.1 % (ref 0.0–5.0)
HEMATOCRIT: 45.6 % (ref 39.0–52.0)
Hemoglobin: 15.4 g/dL (ref 13.0–17.0)
LYMPHS ABS: 2 10*3/uL (ref 0.7–4.0)
LYMPHS PCT: 23.1 % (ref 12.0–46.0)
MCHC: 33.8 g/dL (ref 30.0–36.0)
MCV: 101 fl — ABNORMAL HIGH (ref 78.0–100.0)
Monocytes Absolute: 1.5 10*3/uL — ABNORMAL HIGH (ref 0.1–1.0)
Monocytes Relative: 17.1 % — ABNORMAL HIGH (ref 3.0–12.0)
NEUTROS ABS: 5 10*3/uL (ref 1.4–7.7)
NEUTROS PCT: 58.1 % (ref 43.0–77.0)
PLATELETS: 175 10*3/uL (ref 150.0–400.0)
RBC: 4.51 Mil/uL (ref 4.22–5.81)
RDW: 13.4 % (ref 11.5–15.5)
WBC: 8.6 10*3/uL (ref 4.0–10.5)

## 2017-10-18 LAB — BASIC METABOLIC PANEL
BUN: 14 mg/dL (ref 6–23)
CALCIUM: 9.8 mg/dL (ref 8.4–10.5)
CO2: 27 meq/L (ref 19–32)
CREATININE: 0.96 mg/dL (ref 0.40–1.50)
Chloride: 99 mEq/L (ref 96–112)
GFR: 82.96 mL/min (ref >60.00)
Glucose, Bld: 135 mg/dL — ABNORMAL HIGH (ref 70–99)
Potassium: 3.8 mEq/L (ref 3.5–5.1)
Sodium: 140 mEq/L (ref 135–145)

## 2017-10-18 LAB — BRAIN NATRIURETIC PEPTIDE: PRO B NATRI PEPTIDE: 241 pg/mL — AB (ref 0.0–100.0)

## 2017-10-18 NOTE — Telephone Encounter (Signed)
Spoke with patient  pt questions if he should take lasix 20mg  with potassium as directed by TP tonight or tomorrow Per TP she advises patient take it tonight lasix 20mg  with potassium  Advised pt of TP recommendations Pt agreed, states he will take the lasix today with potassium Nothing further needed

## 2017-10-19 NOTE — Telephone Encounter (Signed)
Last refill 09/19/17 and last office visit 06/20/17.  Okay to fill?

## 2017-10-19 NOTE — Telephone Encounter (Signed)
FYI for TP- pt sees Dr. Tollie Eth for cards

## 2017-10-19 NOTE — Telephone Encounter (Signed)
Refill with 2 additional refills. 

## 2017-10-20 ENCOUNTER — Encounter: Payer: Self-pay | Admitting: Family Medicine

## 2017-10-21 MED ORDER — LORAZEPAM 0.5 MG PO TABS: ORAL_TABLET | ORAL | 0 refills | Status: DC

## 2017-10-23 ENCOUNTER — Encounter: Payer: Self-pay | Admitting: Adult Health

## 2017-10-25 ENCOUNTER — Other Ambulatory Visit: Payer: Self-pay | Admitting: Adult Health

## 2017-10-25 ENCOUNTER — Encounter: Payer: Self-pay | Admitting: Adult Health

## 2017-10-25 ENCOUNTER — Ambulatory Visit: Payer: Medicare Other | Admitting: Adult Health

## 2017-10-25 DIAGNOSIS — J4531 Mild persistent asthma with (acute) exacerbation: Secondary | ICD-10-CM

## 2017-10-25 DIAGNOSIS — J441 Chronic obstructive pulmonary disease with (acute) exacerbation: Secondary | ICD-10-CM | POA: Diagnosis not present

## 2017-10-25 MED ORDER — FLUTICASONE FUROATE-VILANTEROL 100-25 MCG/INH IN AEPB: 1.0000 | INHALATION_SPRAY | Freq: Every day | RESPIRATORY_TRACT | 5 refills | Status: DC

## 2017-10-25 MED ORDER — FUROSEMIDE 20 MG PO TABS: 20.0000 mg | ORAL_TABLET | Freq: Every day | ORAL | 0 refills | Status: DC

## 2017-10-25 NOTE — Patient Instructions (Addendum)
Change Lasix 20mg  daily As needed  For leg swelling  Can go back to BREO 100 1 puff daily  Follow up for 2 D echo this week as planned Low salt diet  Follow up with Cardiology next week as planned and As needed   Follow up with Dr. Lamonte Sakai  In 3 months and As needed   Please contact office for sooner follow up if symptoms do not improve or worsen or seek emergency care

## 2017-10-25 NOTE — Progress Notes (Signed)
@Patient  ID: Adam [name], male    DOB: 17-Jul-1951, 67 y.o.   MRN: [id number]  Chief Complaint  Patient presents with  . Follow-up    COPD     Referring provider: [name], MD  HPI: 67 year old male former smoker followed for COPD with asthma, chronic cough and chronic rhinitis. Past medical history positive for coronary disease and CABG  10/25/2017 Follow up : COPD /Asthma  Patient returns for a one-week follow-up.  Patient was seen last visit with a COPD exacerbation.  Chest x-ray showed increased interstitial markings consistent with edema.  BNP was elevated at 241.  Patient was recommended to increase Brio to 200 and started on Lasix 20 mg daily.  Patient says he is feeling improved.  He has decreased shortness of breath.  Lower extremity swelling has resolved.  Weight is down 6 pounds.  Patient has been set up for a 2D echo and cardiology follow-up.  His echo is scheduled for later this week.  He has a follow-up appointment with cardiology next week.  He denies any chest pain orthopnea hemoptysis or increased edema.  Allergies  Allergen Reactions  . Clindamycin/Lincomycin   . Keflex [Cephalexin]     rash    Immunization History  Administered Date(s) Administered  . Influenza Split 07/04/2012  . Influenza, High Dose Seasonal PF 05/28/2016, 06/08/2017  . Influenza,inj,Quad PF,6+ Mos 07/05/2013, 05/22/2014, 06/17/2015  . Pneumococcal Conjugate-13 12/01/2015  . Pneumococcal Polysaccharide-23 10/05/2006, 11/30/2016  . Td 10/06/2009  . Zoster 08/21/2013  . Zoster Recombinat (Shingrix) 12/02/2016, 04/02/2017    Past Medical History:  Diagnosis Date  . Allergy   . Anxiety    history of PTSD following CABG  . Arthritis   . Asthma   . Colitis- colonoscopy 2014 07/13/2015  . COPD (chronic obstructive pulmonary disease) ([place])   . Coronary artery disease    x 6  . Depression   . Family history of polyps in the colon   . GERD (gastroesophageal reflux disease)   .  Gout   . H/O atrial fibrillation without current medication    following CABG with no documented episodes since then.  Marland Kitchen Hx of adenomatous colonic polyps 08/12/2010  . Hyperlipidemia   . Hypertension   . Prediabetes   . RLS (restless legs syndrome)   . 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 Packs/day: 1.00  . Years: 7.00  . Pack years: 7.00  . Types: Cigarettes  . Last attempt to quit: 10/04/1977  . Years since quitting: 40.0  Smokeless Tobacco Never Used  Tobacco Comment   never smoked over 1 pack    Counseling given: Not Answered Comment: never smoked over 1 pack    Outpatient Encounter Medications as of 10/25/2017  Medication Sig  . albuterol (PROAIR HFA) 108 (90 BASE) MCG/ACT inhaler Inhale 1-2 puffs into the lungs every 6 (six) hours as needed for wheezing or shortness of breath.  . allopurinol (ZYLOPRIM) 300 MG tablet TAKE 1 TABLET BY MOUTH EVERY DAY  . aspirin 81 MG tablet Take 81 mg by mouth daily.  . Coenzyme Q10 (CO Q-10) 100 MG CAPS Take 2 tablets by mouth daily.  . empagliflozin (JARDIANCE) 25 MG TABS tablet Take 25 mg by mouth daily.  . fenofibrate 160 MG tablet Take 1 tablet (160 mg total) by mouth daily.  . fluticasone (FLONASE) 50 MCG/ACT nasal spray Place  1 spray into the nose daily.   . fluticasone furoate-vilanterol (BREO ELLIPTA) 200-25 MCG/INH AEPB Inhale 1 puff into the lungs daily.  Marland Kitchen glucose blood (ONETOUCH VERIO) test strip Test glucose once daily E11.9  . Insulin Pen Needle (BD PEN NEEDLE NANO U/F) 32G X 4 MM MISC Use as instructed  . LORazepam (ATIVAN) 0.5 MG tablet Take half tablet every 8 hours as needed for anxiety  . losartan-hydrochlorothiazide (HYZAAR) 100-25 MG tablet TAKE 1 TABLET BY MOUTH EVERY DAY  . metFORMIN (GLUCOPHAGE) 1000 MG tablet TAKE 1 TABLET BY MOUTH TWICE DAILY WITH A MEAL  . metoprolol (LOPRESSOR) 100 MG tablet Take 100 mg by mouth 2 (two)  times daily.  . montelukast (SINGULAIR) 10 MG tablet TAKE 1 TABLET BY MOUTH AT BEDTIME  . Olopatadine HCl (PATADAY) 0.2 % SOLN Apply 1 drop to eye as needed.  Glory Rosebush DELICA LANCETS FINE MISC 1 Device by Other route as needed. Use as instructed.  DX: 250.00  . pantoprazole (PROTONIX) 40 MG tablet Take 1 tablet (40 mg total) by mouth 2 (two) times daily.  . pramipexole (MIRAPEX) 0.25 MG tablet Take 1 tablet (0.25 mg total) by mouth at bedtime.  . simvastatin (ZOCOR) 80 MG tablet TAKE 1 TABLET BY MOUTH EVERY DAY  . SPIRIVA HANDIHALER 18 MCG inhalation capsule INHALE CONTENTS OF 1 CAPSULE ONCE DAILY USING HANDIHALER  . TOUJEO SOLOSTAR 300 UNIT/ML SOPN INJECT 80 UNITS INTO THE SKIN AS DIRECTED  . [DISCONTINUED] BREO ELLIPTA 100-25 MCG/INH AEPB INHALE 1 PUFF BY MOUTH EVERY DAY  . [DISCONTINUED] furosemide (LASIX) 20 MG tablet TAKE 1 TABLET BY MOUTH EVERY DAY  . [DISCONTINUED] furosemide (LASIX) 20 MG tablet TAKE 1 TABLET BY MOUTH EVERY DAY  . [DISCONTINUED] potassium chloride SA (K-DUR,KLOR-CON) 20 MEQ tablet TAKE 1 TABLET BY MOUTH EVERY DAY  . [DISCONTINUED] potassium chloride SA (K-DUR,KLOR-CON) 20 MEQ tablet TAKE 1 TABLET BY MOUTH EVERY DAY  . fluticasone furoate-vilanterol (BREO ELLIPTA) 100-25 MCG/INH AEPB Inhale 1 puff into the lungs daily.  . furosemide (LASIX) 20 MG tablet Take 1 tablet (20 mg total) by mouth daily. Take as needed for leg swelling   No facility-administered encounter medications on file as of 10/25/2017.      Review of Systems  Constitutional:   No  weight loss, night sweats,  Fevers, chills, fatigue, or  lassitude.  HEENT:   No headaches,  Difficulty swallowing,  Tooth/dental problems, or  Sore throat,                No sneezing, itching, ear ache, nasal congestion, post nasal drip,   CV:  No chest pain,  Orthopnea, PND, swelling in lower extremities, anasarca, dizziness, palpitations, syncope.   GI  No heartburn, indigestion, abdominal pain, nausea, vomiting,  diarrhea, change in bowel habits, loss of appetite, bloody stools.   Resp:    No chest wall deformity  Skin: no rash or lesions.  GU: no dysuria, change in color of urine, no urgency or frequency.  No flank pain, no hematuria   MS:  No joint pain or swelling.  No decreased range of motion.  No back pain.    Physical Exam  BP 128/72 (BP Location: Left Arm, Cuff Size: Normal)   Pulse 99   Ht 5\' 10"  (1.778 m)   Wt 263 lb 12.8 oz (119.7 kg)   SpO2 97%   BMI 37.85 kg/m   GEN: A/Ox3; pleasant , NAD, obese   HEENT:  [place]/AT,  EACs-clear, TMs-wnl, NOSE-clear, THROAT-clear,  no lesions, no postnasal drip or exudate noted.   NECK:  Supple w/ fair ROM; no JVD; normal carotid impulses w/o bruits; no thyromegaly or nodules palpated; no lymphadenopathy.    RESP  Clear  P & A; w/o, wheezes/ rales/ or rhonchi. no accessory muscle use, no dullness to percussion  CARD:  RRR, no m/r/g, no peripheral edema, pulses intact, no cyanosis or clubbing.  GI:   Soft & nt; nml bowel sounds; no organomegaly or masses detected.   Musco: Warm bil, no deformities or joint swelling noted.   Neuro: alert, no focal deficits noted.    Skin: Warm, no lesions or rashes    Lab Results:  CBC    Component Value Date/Time   WBC 8.6 10/17/2017 1726   RBC 4.51 10/17/2017 1726   HGB 15.4 10/17/2017 1726   HCT 45.6 10/17/2017 1726   PLT 175.0 10/17/2017 1726   MCV 101.0 (H) 10/17/2017 1726   MCHC 33.8 10/17/2017 1726   RDW 13.4 10/17/2017 1726   LYMPHSABS 2.0 10/17/2017 1726   MONOABS 1.5 (H) 10/17/2017 1726   EOSABS 0.1 10/17/2017 1726   BASOSABS 0.1 10/17/2017 1726    BMET    Component Value Date/Time   NA 140 10/17/2017 1726   K 3.8 10/17/2017 1726   CL 99 10/17/2017 1726   CO2 27 10/17/2017 1726   GLUCOSE 135 (H) 10/17/2017 1726   BUN 14 10/17/2017 1726   CREATININE 0.96 10/17/2017 1726   CALCIUM 9.8 10/17/2017 1726    BNP No results found for: BNP  ProBNP    Component Value  Date/Time   PROBNP 241.0 (H) 10/17/2017 1726    Imaging: Dg Chest 2 View  Result Date: 10/17/2017 CLINICAL DATA:  Patient with cough and congestion. Shortness of breath. EXAM: CHEST  2 VIEW COMPARISON:  None. FINDINGS: Cardiac contours are enlarged. Status post median sternotomy. Fractures of multiple sternotomy wires. Low lung volumes. Pulmonary vascular redistribution. Perihilar interstitial pulmonary opacities. Thoracic spine degenerative changes. IMPRESSION: Cardiomegaly with perihilar interstitial opacities concerning for pulmonary edema. Atypical infection could have a similar appearance. Recommend comparison with outside priors if available. Electronically Signed   By: Lovey Newcomer M.D.   On: 10/17/2017 17:45     Assessment & Plan:   COPD (chronic obstructive pulmonary disease) Recent exacerbation with suspected volume overload possibly from diastolic dysfunction.  Patient's BNP was elevated along with extremity edema on exam and edema on chest x-ray.  Patient has clinically improved with weight loss and resolution of lower extremity edema.  2D echo is pending.  Patient will continue to follow-up with cardiology next week as planned.  Will change Lasix to 20 mg daily as needed.  Patient is already on hydrochlorothiazide 25 mg daily.  Plan  Patient Instructions  Change Lasix 20mg  daily As needed  For leg swelling  Can go back to BREO 100 1 puff daily  Follow up for 2 D echo this week as planned Low salt diet  Follow up with Cardiology next week as planned and As needed   Follow up with Dr. Lamonte Sakai  In 3 months and As needed   Please contact office for sooner follow up if symptoms do not improve or worsen or seek emergency care       Asthma, mild persistent Improved .  Change back to BREO 100 .daily       Rexene Edison, NP 10/25/2017

## 2017-10-25 NOTE — Assessment & Plan Note (Addendum)
Improved .  Change back to BREO 100 .daily

## 2017-10-25 NOTE — Assessment & Plan Note (Signed)
Recent exacerbation with suspected volume overload possibly from diastolic dysfunction.  Patient's BNP was elevated along with extremity edema on exam and edema on chest x-ray.  Patient has clinically improved with weight loss and resolution of lower extremity edema.  2D echo is pending.  Patient will continue to follow-up with cardiology next week as planned.  Will change Lasix to 20 mg daily as needed.  Patient is already on hydrochlorothiazide 25 mg daily.  Plan  Patient Instructions  Change Lasix 20mg  daily As needed  For leg swelling  Can go back to BREO 100 1 puff daily  Follow up for 2 D echo this week as planned Low salt diet  Follow up with Cardiology next week as planned and As needed   Follow up with Dr. Lamonte Sakai  In 3 months and As needed   Please contact office for sooner follow up if symptoms do not improve or worsen or seek emergency care

## 2017-10-27 ENCOUNTER — Other Ambulatory Visit: Payer: Self-pay

## 2017-10-27 ENCOUNTER — Ambulatory Visit (HOSPITAL_COMMUNITY): Payer: Medicare Other | Attending: Cardiology

## 2017-10-27 VITALS — BP 126/76

## 2017-10-27 DIAGNOSIS — E785 Hyperlipidemia, unspecified: Secondary | ICD-10-CM | POA: Diagnosis not present

## 2017-10-27 DIAGNOSIS — Z720 Tobacco use: Secondary | ICD-10-CM | POA: Diagnosis not present

## 2017-10-27 DIAGNOSIS — I10 Essential (primary) hypertension: Secondary | ICD-10-CM | POA: Insufficient documentation

## 2017-10-27 DIAGNOSIS — J449 Chronic obstructive pulmonary disease, unspecified: Secondary | ICD-10-CM | POA: Insufficient documentation

## 2017-10-27 DIAGNOSIS — I4891 Unspecified atrial fibrillation: Secondary | ICD-10-CM | POA: Diagnosis present

## 2017-10-27 DIAGNOSIS — Z8679 Personal history of other diseases of the circulatory system: Secondary | ICD-10-CM | POA: Diagnosis not present

## 2017-10-27 DIAGNOSIS — I2510 Atherosclerotic heart disease of native coronary artery without angina pectoris: Secondary | ICD-10-CM | POA: Insufficient documentation

## 2017-10-27 DIAGNOSIS — I517 Cardiomegaly: Secondary | ICD-10-CM

## 2017-10-27 MED ORDER — PERFLUTREN LIPID MICROSPHERE
1.0000 mL | INTRAVENOUS | Status: AC | PRN
  Administered 2017-10-27: 1 mL via INTRAVENOUS

## 2017-10-28 ENCOUNTER — Other Ambulatory Visit: Payer: Self-pay | Admitting: Family Medicine

## 2017-10-28 ENCOUNTER — Encounter: Payer: Self-pay | Admitting: *Deleted

## 2017-10-28 ENCOUNTER — Encounter: Payer: Self-pay | Admitting: Adult Health

## 2017-10-28 DIAGNOSIS — Z8679 Personal history of other diseases of the circulatory system: Secondary | ICD-10-CM | POA: Insufficient documentation

## 2017-10-31 ENCOUNTER — Other Ambulatory Visit: Payer: Self-pay | Admitting: *Deleted

## 2017-10-31 MED ORDER — INSULIN PEN NEEDLE 32G X 4 MM MISC: 3 refills | Status: DC

## 2017-11-01 NOTE — Telephone Encounter (Signed)
LMOM TCB x1 for Adam [name] at echo dept in cardiology

## 2017-11-04 DIAGNOSIS — I2510 Atherosclerotic heart disease of native coronary artery without angina pectoris: Secondary | ICD-10-CM | POA: Diagnosis not present

## 2017-11-04 DIAGNOSIS — I10 Essential (primary) hypertension: Secondary | ICD-10-CM | POA: Diagnosis not present

## 2017-11-04 DIAGNOSIS — I480 Paroxysmal atrial fibrillation: Secondary | ICD-10-CM | POA: Diagnosis not present

## 2017-11-04 DIAGNOSIS — I5032 Chronic diastolic (congestive) heart failure: Secondary | ICD-10-CM | POA: Diagnosis not present

## 2017-11-05 ENCOUNTER — Other Ambulatory Visit: Payer: Self-pay | Admitting: Family Medicine

## 2017-11-07 ENCOUNTER — Encounter: Payer: Self-pay | Admitting: Adult Health

## 2017-11-07 NOTE — Telephone Encounter (Signed)
HI Adam [name],  I just want to update you after my visit with DR. Wynonia Lawman. Turns out I do have atrial fibrillation and that is what was happening to me the day I visited with you. Dr. Wynonia Lawman put me on Eliquis 5mg  twice/day and am wearing a heart monitor for the next 30 days. I will have a follow up visit with him at that point.  Thank you and [name] for all of your help!  Adam [name],  [name]	[place]

## 2017-11-21 DIAGNOSIS — D225 Melanocytic nevi of trunk: Secondary | ICD-10-CM | POA: Diagnosis not present

## 2017-11-21 DIAGNOSIS — L814 Other melanin hyperpigmentation: Secondary | ICD-10-CM | POA: Diagnosis not present

## 2017-11-21 DIAGNOSIS — L821 Other seborrheic keratosis: Secondary | ICD-10-CM | POA: Diagnosis not present

## 2017-11-21 DIAGNOSIS — L570 Actinic keratosis: Secondary | ICD-10-CM | POA: Diagnosis not present

## 2017-11-22 ENCOUNTER — Encounter: Payer: Self-pay | Admitting: Family Medicine

## 2017-11-23 LAB — ECHOCARDIOGRAM COMPLETE

## 2017-11-23 MED ORDER — LORAZEPAM 0.5 MG PO TABS: ORAL_TABLET | ORAL | 0 refills | Status: DC

## 2017-11-24 ENCOUNTER — Other Ambulatory Visit: Payer: Self-pay | Admitting: Adult Health

## 2017-11-25 ENCOUNTER — Telehealth: Payer: Self-pay | Admitting: Family Medicine

## 2017-11-25 MED ORDER — LORAZEPAM 0.5 MG PO TABS: ORAL_TABLET | ORAL | 0 refills | Status: DC

## 2017-11-25 NOTE — Telephone Encounter (Signed)
Rx called in 

## 2017-11-25 NOTE — Telephone Encounter (Signed)
We have discussed this.  Refill for one tablet every 8 hours.  Will discuss again at follow up.

## 2017-11-25 NOTE — Telephone Encounter (Signed)
Patient states he takes one tablet every 8 hours and gets 90 tablets for 30 days.  Okay to fill?  A refill was sent for half tab.

## 2017-11-25 NOTE — Telephone Encounter (Signed)
Copied from [place] [id number]. Topic: Quick Communication - Rx Refill/Question >> Nov 25, 2017 11:36 AM Scherrie Gerlach wrote: Medication: LORazepam (ATIVAN) 0.5 MG tablet Pt states the instructions on this Rx have changed to a half tablet every 8 hours Pt states he did not know anything about this change.  Never discussed with the doctor about changing. He did not realize when he picked up last month. Pt hoping this is a typo, because he is out and needs today!!  [facility] [place], [place] - [address] [place] [address] [phone] (Phone) [phone] (Fax)

## 2017-12-05 DIAGNOSIS — I2510 Atherosclerotic heart disease of native coronary artery without angina pectoris: Secondary | ICD-10-CM | POA: Diagnosis not present

## 2017-12-05 DIAGNOSIS — I5032 Chronic diastolic (congestive) heart failure: Secondary | ICD-10-CM | POA: Diagnosis not present

## 2017-12-05 DIAGNOSIS — J45909 Unspecified asthma, uncomplicated: Secondary | ICD-10-CM | POA: Diagnosis not present

## 2017-12-05 DIAGNOSIS — I480 Paroxysmal atrial fibrillation: Secondary | ICD-10-CM | POA: Diagnosis not present

## 2017-12-12 ENCOUNTER — Inpatient Hospital Stay (HOSPITAL_COMMUNITY)
Admission: RE | Admit: 2017-12-12 | Discharge: 2017-12-18 | DRG: 309 | Disposition: A | Discharge status: Home / Self Care | Payer: Medicare Other | Source: Ambulatory Visit | Attending: Cardiology | Admitting: Cardiology

## 2017-12-12 DIAGNOSIS — E785 Hyperlipidemia, unspecified: Secondary | ICD-10-CM | POA: Diagnosis present

## 2017-12-12 DIAGNOSIS — I5032 Chronic diastolic (congestive) heart failure: Secondary | ICD-10-CM | POA: Diagnosis present

## 2017-12-12 DIAGNOSIS — I480 Paroxysmal atrial fibrillation: Secondary | ICD-10-CM | POA: Diagnosis present

## 2017-12-12 DIAGNOSIS — Z951 Presence of aortocoronary bypass graft: Secondary | ICD-10-CM | POA: Diagnosis not present

## 2017-12-12 DIAGNOSIS — Z96649 Presence of unspecified artificial hip joint: Secondary | ICD-10-CM | POA: Diagnosis present

## 2017-12-12 DIAGNOSIS — Z7901 Long term (current) use of anticoagulants: Secondary | ICD-10-CM

## 2017-12-12 DIAGNOSIS — E119 Type 2 diabetes mellitus without complications: Secondary | ICD-10-CM | POA: Diagnosis present

## 2017-12-12 DIAGNOSIS — Z6837 Body mass index (BMI) 37.0-37.9, adult: Secondary | ICD-10-CM

## 2017-12-12 DIAGNOSIS — E876 Hypokalemia: Secondary | ICD-10-CM | POA: Diagnosis present

## 2017-12-12 DIAGNOSIS — F419 Anxiety disorder, unspecified: Secondary | ICD-10-CM | POA: Diagnosis present

## 2017-12-12 DIAGNOSIS — Z955 Presence of coronary angioplasty implant and graft: Secondary | ICD-10-CM

## 2017-12-12 DIAGNOSIS — I2510 Atherosclerotic heart disease of native coronary artery without angina pectoris: Secondary | ICD-10-CM | POA: Diagnosis present

## 2017-12-12 DIAGNOSIS — I110 Hypertensive heart disease with heart failure: Secondary | ICD-10-CM | POA: Diagnosis present

## 2017-12-12 DIAGNOSIS — I481 Persistent atrial fibrillation: Secondary | ICD-10-CM | POA: Diagnosis present

## 2017-12-12 DIAGNOSIS — Z7984 Long term (current) use of oral hypoglycemic drugs: Secondary | ICD-10-CM | POA: Diagnosis not present

## 2017-12-12 DIAGNOSIS — Z87891 Personal history of nicotine dependence: Secondary | ICD-10-CM

## 2017-12-12 DIAGNOSIS — J449 Chronic obstructive pulmonary disease, unspecified: Secondary | ICD-10-CM | POA: Diagnosis present

## 2017-12-12 DIAGNOSIS — I4819 Other persistent atrial fibrillation: Secondary | ICD-10-CM | POA: Diagnosis present

## 2017-12-12 LAB — CBC WITH DIFFERENTIAL/PLATELET
Basophils Absolute: 0 10*3/uL (ref 0.0–0.1)
Basophils Relative: 1 %
Eosinophils Absolute: 0.1 10*3/uL (ref 0.0–0.7)
Eosinophils Relative: 1 %
HCT: 44.5 % (ref 39.0–52.0)
HEMOGLOBIN: 15.4 g/dL (ref 13.0–17.0)
LYMPHS ABS: 1.9 10*3/uL (ref 0.7–4.0)
Lymphocytes Relative: 23 %
MCH: 34.2 pg — AB (ref 26.0–34.0)
MCHC: 34.6 g/dL (ref 30.0–36.0)
MCV: 98.9 fL (ref 78.0–100.0)
MONO ABS: 1.4 10*3/uL — AB (ref 0.1–1.0)
MONOS PCT: 17 %
NEUTROS PCT: 58 %
Neutro Abs: 4.8 10*3/uL (ref 1.7–7.7)
Platelets: 178 10*3/uL (ref 150–400)
RBC: 4.5 MIL/uL (ref 4.22–5.81)
RDW: 13.1 % (ref 11.5–15.5)
WBC: 8.3 10*3/uL (ref 4.0–10.5)

## 2017-12-12 LAB — MAGNESIUM: MAGNESIUM: 1.9 mg/dL (ref 1.7–2.4)

## 2017-12-12 LAB — COMPREHENSIVE METABOLIC PANEL
ALK PHOS: 28 U/L — AB (ref 38–126)
ALT: 30 U/L (ref 17–63)
AST: 39 U/L (ref 15–41)
Albumin: 4 g/dL (ref 3.5–5.0)
Anion gap: 15 (ref 5–15)
BILIRUBIN TOTAL: 1.1 mg/dL (ref 0.3–1.2)
BUN: 12 mg/dL (ref 6–20)
CALCIUM: 10 mg/dL (ref 8.9–10.3)
CHLORIDE: 98 mmol/L — AB (ref 101–111)
CO2: 24 mmol/L (ref 22–32)
CREATININE: 1.08 mg/dL (ref 0.61–1.24)
Glucose, Bld: 134 mg/dL — ABNORMAL HIGH (ref 65–99)
Potassium: 3.5 mmol/L (ref 3.5–5.1)
Sodium: 137 mmol/L (ref 135–145)
TOTAL PROTEIN: 7.5 g/dL (ref 6.5–8.1)

## 2017-12-12 LAB — HEMOGLOBIN A1C
Hgb A1c MFr Bld: 6.7 % — ABNORMAL HIGH (ref 4.8–5.6)
Mean Plasma Glucose: 145.59 mg/dL

## 2017-12-12 MED ORDER — FLUTICASONE PROPIONATE 50 MCG/ACT NA SUSP
1.0000 | Freq: Every day | NASAL | Status: DC
Start: 2017-12-13 — End: 2017-12-18
  Administered 2017-12-13 – 2017-12-18 (×6): 1 via NASAL
  Filled 2017-12-12 (×2): qty 16

## 2017-12-12 MED ORDER — SODIUM CHLORIDE 0.9% FLUSH
3.0000 mL | INTRAVENOUS | Status: DC | PRN
  Administered 2017-12-15: 3 mL via INTRAVENOUS
  Filled 2017-12-12: qty 3

## 2017-12-12 MED ORDER — SODIUM CHLORIDE 0.9 % IV SOLN: 250.0000 mL | INTRAVENOUS | Status: DC | PRN

## 2017-12-12 MED ORDER — TIOTROPIUM BROMIDE MONOHYDRATE 18 MCG IN CAPS
18.0000 ug | ORAL_CAPSULE | Freq: Every day | RESPIRATORY_TRACT | Status: DC
  Administered 2017-12-14 – 2017-12-17 (×4): 18 ug via RESPIRATORY_TRACT
  Filled 2017-12-12: qty 5

## 2017-12-12 MED ORDER — ALBUTEROL SULFATE (2.5 MG/3ML) 0.083% IN NEBU: 3.0000 mL | INHALATION_SOLUTION | Freq: Four times a day (QID) | RESPIRATORY_TRACT | Status: DC | PRN

## 2017-12-12 MED ORDER — INSULIN ASPART 100 UNIT/ML ~~LOC~~ SOLN
0.0000 [IU] | Freq: Three times a day (TID) | SUBCUTANEOUS | Status: DC
  Administered 2017-12-13: 2 [IU] via SUBCUTANEOUS
  Administered 2017-12-13: 3 [IU] via SUBCUTANEOUS
  Administered 2017-12-13: 2 [IU] via SUBCUTANEOUS
  Administered 2017-12-14: 3 [IU] via SUBCUTANEOUS
  Administered 2017-12-14 – 2017-12-15 (×3): 2 [IU] via SUBCUTANEOUS

## 2017-12-12 MED ORDER — ACETAMINOPHEN 325 MG PO TABS: 650.0000 mg | ORAL_TABLET | ORAL | Status: DC | PRN

## 2017-12-12 MED ORDER — METFORMIN HCL 500 MG PO TABS
1000.0000 mg | ORAL_TABLET | Freq: Two times a day (BID) | ORAL | Status: DC
  Filled 2017-12-12: qty 2

## 2017-12-12 MED ORDER — METOPROLOL TARTRATE 100 MG PO TABS
100.0000 mg | ORAL_TABLET | Freq: Two times a day (BID) | ORAL | Status: DC
  Administered 2017-12-12 – 2017-12-18 (×12): 100 mg via ORAL
  Filled 2017-12-12 (×13): qty 1

## 2017-12-12 MED ORDER — APIXABAN 5 MG PO TABS: 5.0000 mg | ORAL_TABLET | Freq: Two times a day (BID) | ORAL | Status: DC

## 2017-12-12 MED ORDER — POTASSIUM CHLORIDE CRYS ER 20 MEQ PO TBCR
40.0000 meq | EXTENDED_RELEASE_TABLET | Freq: Once | ORAL | Status: DC
  Filled 2017-12-12: qty 2

## 2017-12-12 MED ORDER — CANAGLIFLOZIN 100 MG PO TABS
100.0000 mg | ORAL_TABLET | Freq: Every day | ORAL | Status: DC
  Administered 2017-12-14 – 2017-12-18 (×5): 100 mg via ORAL
  Filled 2017-12-12 (×7): qty 1

## 2017-12-12 MED ORDER — LORAZEPAM 0.5 MG PO TABS: 0.5000 mg | ORAL_TABLET | Freq: Four times a day (QID) | ORAL | Status: DC | PRN

## 2017-12-12 MED ORDER — POTASSIUM CHLORIDE CRYS ER 20 MEQ PO TBCR
40.0000 meq | EXTENDED_RELEASE_TABLET | Freq: Once | ORAL | Status: AC
  Administered 2017-12-12: 40 meq via ORAL
  Filled 2017-12-12: qty 2

## 2017-12-12 MED ORDER — PRAMIPEXOLE DIHYDROCHLORIDE 0.25 MG PO TABS
0.2500 mg | ORAL_TABLET | Freq: Every day | ORAL | Status: DC
  Administered 2017-12-12 – 2017-12-17 (×6): 0.25 mg via ORAL
  Filled 2017-12-12 (×6): qty 1

## 2017-12-12 MED ORDER — INSULIN GLARGINE 100 UNIT/ML ~~LOC~~ SOLN
70.0000 [IU] | Freq: Every day | SUBCUTANEOUS | Status: DC
  Administered 2017-12-13 – 2017-12-18 (×6): 70 [IU] via SUBCUTANEOUS
  Filled 2017-12-12 (×7): qty 0.7

## 2017-12-12 MED ORDER — PANTOPRAZOLE SODIUM 40 MG PO TBEC: 40.0000 mg | DELAYED_RELEASE_TABLET | Freq: Every day | ORAL | Status: DC

## 2017-12-12 MED ORDER — ONDANSETRON HCL 4 MG/2ML IJ SOLN: 4.0000 mg | Freq: Four times a day (QID) | INTRAMUSCULAR | Status: DC | PRN

## 2017-12-12 MED ORDER — ATORVASTATIN CALCIUM 40 MG PO TABS
40.0000 mg | ORAL_TABLET | Freq: Every day | ORAL | Status: DC
  Administered 2017-12-12 – 2017-12-17 (×6): 40 mg via ORAL
  Filled 2017-12-12 (×7): qty 1

## 2017-12-12 MED ORDER — DILTIAZEM HCL ER COATED BEADS 120 MG PO CP24
120.0000 mg | ORAL_CAPSULE | Freq: Every day | ORAL | Status: DC
  Filled 2017-12-12: qty 1

## 2017-12-12 MED ORDER — MONTELUKAST SODIUM 10 MG PO TABS
10.0000 mg | ORAL_TABLET | Freq: Every day | ORAL | Status: DC
  Administered 2017-12-12 – 2017-12-17 (×6): 10 mg via ORAL
  Filled 2017-12-12 (×7): qty 1

## 2017-12-12 MED ORDER — FLUTICASONE FUROATE-VILANTEROL 100-25 MCG/INH IN AEPB
1.0000 | INHALATION_SPRAY | Freq: Every day | RESPIRATORY_TRACT | Status: DC
  Administered 2017-12-13 – 2017-12-18 (×5): 1 via RESPIRATORY_TRACT
  Filled 2017-12-12: qty 28

## 2017-12-12 MED ORDER — FENOFIBRATE 160 MG PO TABS
160.0000 mg | ORAL_TABLET | Freq: Every day | ORAL | Status: DC
  Administered 2017-12-13 – 2017-12-18 (×6): 160 mg via ORAL
  Filled 2017-12-12 (×7): qty 1

## 2017-12-12 MED ORDER — MAGNESIUM OXIDE 400 (241.3 MG) MG PO TABS
400.0000 mg | ORAL_TABLET | Freq: Once | ORAL | Status: AC
  Administered 2017-12-12: 400 mg via ORAL
  Filled 2017-12-12: qty 1

## 2017-12-12 MED ORDER — ALLOPURINOL 300 MG PO TABS: 300.0000 mg | ORAL_TABLET | Freq: Every day | ORAL | Status: DC

## 2017-12-12 MED ORDER — SODIUM CHLORIDE 0.9% FLUSH
3.0000 mL | Freq: Two times a day (BID) | INTRAVENOUS | Status: DC
  Administered 2017-12-12 – 2017-12-18 (×12): 3 mL via INTRAVENOUS

## 2017-12-12 MED ORDER — APIXABAN 5 MG PO TABS
5.0000 mg | ORAL_TABLET | Freq: Two times a day (BID) | ORAL | Status: DC
  Administered 2017-12-12: 5 mg via ORAL
  Filled 2017-12-12: qty 1

## 2017-12-12 MED ORDER — LOSARTAN POTASSIUM 50 MG PO TABS: 100.0000 mg | ORAL_TABLET | Freq: Every day | ORAL | Status: DC

## 2017-12-12 MED ORDER — MAGNESIUM OXIDE 400 (241.3 MG) MG PO TABS
400.0000 mg | ORAL_TABLET | Freq: Once | ORAL | Status: DC
  Filled 2017-12-12: qty 1

## 2017-12-12 MED ORDER — CO Q-10 100 MG PO CAPS: 2.0000 | ORAL_CAPSULE | Freq: Every day | ORAL | Status: DC

## 2017-12-12 MED ORDER — METFORMIN HCL 500 MG PO TABS
1000.0000 mg | ORAL_TABLET | Freq: Two times a day (BID) | ORAL | Status: DC
  Administered 2017-12-12: 1000 mg via ORAL
  Filled 2017-12-12: qty 1
  Filled 2017-12-12: qty 2

## 2017-12-12 NOTE — H&P (Signed)
Adam [name]    Date of visit:  12/12/2017 DOB:  05-02-1951    Age:  67 yrs. Medical record number:  [id number]     Account number:  [id number] Primary Care Provider: Carolann Littler ____________________________ CURRENT DIAGNOSES  1. CAD Native without angina  2. Paroxysmal atrial fibrillation  3. Chronic diastolic heart failure  4. Unspecified asthma, uncomplicated  5. Long term (current) use of anticoagulants  6. Hypertensive heart disease without heart failure  7. Presence of aortocoronary bypass graft  8. COPD  9. Ventricular premature depolarization  10. Type 2 diabetes mellitus without complications  11. Obesity  12. Hyperlipidemia  13. Dyspnea, unspecified ____________________________ ALLERGIES  Ace Inhibitors, Intolerance-cough  Cephalexin, Intolerance-unknown  Clindamycin, Rash ____________________________ MEDICATIONS  1. albuterol sulfate HFA 90 mcg/actuation aerosol inhaler, PRN  2. allopurinol 300 mg tablet, 1 p.o. daily  3. Breo Ellipta 100 mcg-25 mcg/dose powder for inhalation, 1 puff Daily  4. Cardizem LA 120 mg tablet extended release 24 hr, 1 p.o. daily  5. CoQ-10 100 mg capsule, 1 p.o. daily  6. econazole 1 % topical cream, PRN  7. Eliquis 5 mg tablet, BID  8. fenofibrate 160 mg tablet, 1 p.o. daily  9. Flonase Allergy Relief 50 mcg/actuation nasal spray,suspension, PRN  10. Jardiance 25 mg tablet, 1 p.o. daily  11. lorazepam 0.5 mg tablet, TID PRN  12. losartan 100 mg-hydrochlorothiazide 25 mg tablet, 1 p.o. daily  13. metformin 1,000 mg tablet, BID  14. metoprolol tartrate 100 mg tablet, BID  15. Mirapex 0.25 mg tablet, QHS  16. pantoprazole 40 mg tablet,delayed release, BID  17. Pataday 0.2 % eye drops, 1 gtt ou qd PRN  18. sildenafil 100 mg tablet, PRN  19. simvastatin 80 mg tablet, 1 p.o. daily  20. Singulair 10 mg tablet, qd  21. Spiriva with HandiHaler 18 mcg and inhalation capsules, 1 puff Daily  22. Toujeo SoloStar 300 unit/mL (1.5 mL)  subcutaneous insulin pen, Take as directed ____________________________ HISTORY OF PRESENT ILLNESS Patient returns for cardiac followup. He has worn a cardiac event monitor showing multiple episodes of atrial fibrillation and he is in atrial fibrillation today. He still complains of some dyspnea. He denies angina and remains obese. He has no PND, orthopnea or edema. He has tolerated Eliquis without bleeding complications. ____________________________ PAST HISTORY  Past Medical Illnesses:  hypertension, COPD, restless legs, DM-non-insulin dependent, anxiety, asthma, history of pancreatitis, gout;  Cardiovascular Illnesses:  CAD, atrial fibrillation, arrhythmia-PVCs;  Surgical Procedures:  appendectomy, hip replacement-left, tonsillectomy/adenoids, CABG, fx rt ankle;  NYHA Classification:  I;  Canadian Angina Classification:  Class 0: Asymptomatic;  Cardiology Procedures-Invasive:  cardiac cath (left), Multiple prior stents, CABG 12/01/2009 (LIMA to LAD; RIMA to Diag; L radial artery to LCx; SVG to PDA, SVG to PLSA; ? SVG to post vent branch);  Cardiology Procedures-Noninvasive:  treadmill cardiolite September 2013, echocardiogram February 2013;  LVEF of 60% documented via echocardiogram on 05/30/2012,   ____________________________ CARDIO-PULMONARY TEST DATES EKG Date:  12/05/2017;  CABG: 12/01/2009;  Nuclear Study Date:  06/13/2012;  Echocardiography Date: 11/30/2011;   ____________________________ SOCIAL HISTORY Alcohol Use:  scotch 3 glasses/day;  Smoking:  used to smoke but quit Prior to 1980, 20 pack year history;  Diet:  carbohydrate modified diet;  Lifestyle:  married;  Exercise:  exercises daily;  Occupation:  retired, Risk analyst;  Residence:  lives with wife;   ____________________________ REVIEW OF SYSTEMS General:  denies recent weight change, fatique or change in exercise tolerance. Eyes: denies diplopia, history of  glaucoma or visual problems. Respiratory: dyspnea with exertion  Cardiovascular:  please review HPI Abdominal: history of pancreatitis Genitourinary-Male: erectile dysfunction, nocturia  Musculoskeletal:  arthritis of the right hip, chronic low back pain Neurological:  denies headaches, stroke, or TIA  ____________________________ PHYSICAL EXAMINATION VITAL SIGNS  Blood Pressure:  120/74 Sitting, Right arm, large cuff  , 120/80 Standing, Right arm and large cuff   Pulse:  94/min. Weight:  264.00 lbs. Height:  70.00"BMI: 38  Constitutional:  pleasant white male in no acute distress, moderately obese Skin:  warm and dry to touch, no apparent skin lesions, or masses noted. Head:  normocephalic, normal hair pattern, no masses or tenderness Neck:  supple, without massess. No JVD, thyromegaly or carotid bruits. Carotid upstroke normal. Chest:  normal symmetry, clear to auscultation., healed median sternotomy scar Cardiac:  irregularly irregular rhythm, normal S1 and S2, no S3 or S4 Peripheral Pulses:  the femoral,dorsalis pedis, and posterior tibial pulses are full and equal bilaterally with no bruits auscultated. Extremities & Back:  well healed saphenous vein donor site RLE, well healed radial harvest site left arm, mild bilateral venous insufficiency changes present, no edema present Neurological:  no gross motor or sensory deficits noted, affect appropriate, oriented x3. ____________________________ MOST RECENT LIPID PANEL 06/15/17  CHOL TOTL 187 mg/dl, LDL 107 NM, HDL 55.8 mg/dl, TRIGLYCER 263 mg/dl, ALT 38 u/l, ALK PHOS 27 u/l, CHOL/HDL 3 (Calc) and AST 33 u/l ____________________________ IMPRESSIONS/PLAN  1. Paroxysmal atrial fibrillation currently out of rhythm today 2. Hypertensive heart disease 3. Obesity 4. CAD with previous bypass  Recommendations:  Initiate Tikosyn in house after lab and clearance.                        ____________________________ Cardiology Physician:  Kerry Hough MD [facility]

## 2017-12-12 NOTE — Progress Notes (Signed)
Pharmacy Review for Dofetilide (Tikosyn) Initiation  Admit Complaint: 67 y.o. male admitted 12/12/2017 with atrial fibrillation to be initiated on dofetilide.   Assessment:  Patient Exclusion Criteria: If any screening criteria checked as "Yes", then  patient  should NOT receive dofetilide until criteria item is corrected. If "Yes" please indicate correction plan.  YES  NO Patient  Exclusion Criteria Correction Plan  [x]  []  Baseline QTc interval is greater than or equal to 440 msec. IF above YES box checked dofetilide contraindicated unless patient has ICD; then may proceed if QTc 500-550 msec or with known ventricular conduction abnormalities may proceed with QTc 550-600 msec. QTc =   Plan to follow-up with repeat EKG in AM to assess QTc (no shown history of ICD)  []  [x]  Magnesium level is less than 1.8 mEq/l : Last magnesium:  Lab Results  Component Value Date   MG 1.9 12/12/2017        Giving 400 mg x1 to keep in range   [x]  []  Potassium level is less than 4 mEq/l : Last potassium:  Lab Results  Component Value Date   K 3.5 12/12/2017        Giving 40 mEq KCl per MD  []  [x]  Patient is known or suspected to have a digoxin level greater than 2 ng/ml: No results found for: DIGOXIN    []  [x]  Creatinine clearance less than 20 ml/min (calculated using Cockcroft-Gault, actual body weight and serum creatinine): Estimated Creatinine Clearance: 86 mL/min (by C-G formula based on SCr of 1.08 mg/dL).    []  [x]  Patient has received drugs known to prolong the QT intervals within the last 48 hours (phenothiazines, tricyclics or tetracyclic antidepressants, erythromycin, H-1 antihistamines, cisapride, fluoroquinolones, azithromycin). Drugs not listed above may have an, as yet, undetected potential to prolong the QT interval, updated information on QT prolonging agents is available at this website:QT prolonging agents   [x]  []  Patient received a dose of hydrochlorothiazide (Oretic) alone or in any  combination including triamterene (Dyazide, Maxzide) in the last 48 hours.  Received losartan-HCTZ on 3/11 - MD notified   []  [x]  Patient received a medication known to increase dofetilide plasma concentrations prior to initial dofetilide dose:  . Trimethoprim (Primsol, Proloprim) in the last 36 hours . Verapamil (Calan, Verelan) in the last 36 hours or a sustained release dose in the last 72 hours . Megestrol (Megace) in the last 5 days  . Cimetidine (Tagamet) in the last 6 hours . Ketoconazole (Nizoral) in the last 24 hours . Itraconazole (Sporanox) in the last 48 hours  . Prochlorperazine (Compazine) in the last 36 hours    []  [x]  Patient is known to have a history of torsades de pointes; congenital or acquired long QT syndromes.   []  [x]  Patient has received a Class 1 antiarrhythmic with less than 2 half-lives since last dose. (Disopyramide, Quinidine, Procainamide, Lidocaine, Mexiletine, Flecainide, Propafenone)   []  [x]  Patient has received amiodarone therapy in the past 3 months or amiodarone level is greater than 0.3 ng/ml.    Patient has been appropriately anticoagulated with apixaban.  Ordering provider was confirmed at [email or web address] if they are not listed on the [place] Prescribers list.  Goal of Therapy: Follow renal function, electrolytes, potential drug interactions, and dose adjustment. Provide education and 1 week supply at discharge.  Plan:  Replace electrolytes and follow up in AM with repeat EKG and BMP/Mg. Will monitor for plans of starting with cardiology.   Doylene Canard, PharmD Clinical Pharmacist  Pager: [phone] Phone: [phone]  6:13 PM 12/12/2017

## 2017-12-13 ENCOUNTER — Other Ambulatory Visit: Payer: Self-pay | Admitting: Family Medicine

## 2017-12-13 DIAGNOSIS — I481 Persistent atrial fibrillation: Secondary | ICD-10-CM

## 2017-12-13 LAB — GLUCOSE, CAPILLARY
GLUCOSE-CAPILLARY: 122 mg/dL — AB (ref 65–99)
GLUCOSE-CAPILLARY: 120 mg/dL — AB (ref 65–99)
Glucose-Capillary: 122 mg/dL — ABNORMAL HIGH (ref 65–99)
Glucose-Capillary: 124 mg/dL — ABNORMAL HIGH (ref 65–99)
Glucose-Capillary: 196 mg/dL — ABNORMAL HIGH (ref 65–99)
Glucose-Capillary: 135 mg/dL — ABNORMAL HIGH (ref 65–99)

## 2017-12-13 LAB — BASIC METABOLIC PANEL
Anion gap: 12 (ref 5–15)
Anion gap: 9 (ref 5–15)
BUN: 15 mg/dL (ref 6–20)
BUN: 15 mg/dL (ref 6–20)
CO2: 27 mmol/L (ref 22–32)
CO2: 25 mmol/L (ref 22–32)
CREATININE: 1.17 mg/dL (ref 0.61–1.24)
Calcium: 9.7 mg/dL (ref 8.9–10.3)
Calcium: 10.1 mg/dL (ref 8.9–10.3)
Chloride: 99 mmol/L — ABNORMAL LOW (ref 101–111)
Chloride: 105 mmol/L (ref 101–111)
Creatinine, Ser: 1.04 mg/dL (ref 0.61–1.24)
GFR calc Af Amer: >60 mL/min (ref >60)
GLUCOSE: 117 mg/dL — AB (ref 65–99)
Glucose, Bld: 161 mg/dL — ABNORMAL HIGH (ref 65–99)
POTASSIUM: 3.4 mmol/L — AB (ref 3.5–5.1)
Potassium: 4.3 mmol/L (ref 3.5–5.1)
Sodium: 138 mmol/L (ref 135–145)
Sodium: 139 mmol/L (ref 135–145)

## 2017-12-13 LAB — MAGNESIUM: MAGNESIUM: 2 mg/dL (ref 1.7–2.4)

## 2017-12-13 MED ORDER — POTASSIUM CHLORIDE 20 MEQ/15ML (10%) PO SOLN
40.0000 meq | ORAL | Status: AC
  Administered 2017-12-13 (×2): 40 meq via ORAL
  Filled 2017-12-13 (×2): qty 30

## 2017-12-13 MED ORDER — METFORMIN HCL 500 MG PO TABS
1000.0000 mg | ORAL_TABLET | Freq: Two times a day (BID) | ORAL | Status: DC
  Administered 2017-12-13 – 2017-12-18 (×11): 1000 mg via ORAL
  Filled 2017-12-13 (×12): qty 2

## 2017-12-13 MED ORDER — METOPROLOL TARTRATE 100 MG PO TABS: 100.0000 mg | ORAL_TABLET | Freq: Two times a day (BID) | ORAL | Status: DC

## 2017-12-13 MED ORDER — APIXABAN 5 MG PO TABS
5.0000 mg | ORAL_TABLET | Freq: Two times a day (BID) | ORAL | Status: DC
  Administered 2017-12-13 – 2017-12-18 (×11): 5 mg via ORAL
  Filled 2017-12-13 (×11): qty 1

## 2017-12-13 MED ORDER — PANTOPRAZOLE SODIUM 40 MG PO TBEC
40.0000 mg | DELAYED_RELEASE_TABLET | Freq: Every day | ORAL | Status: DC
  Administered 2017-12-13 – 2017-12-18 (×6): 40 mg via ORAL
  Filled 2017-12-13 (×6): qty 1

## 2017-12-13 MED ORDER — DILTIAZEM HCL ER COATED BEADS 120 MG PO CP24
120.0000 mg | ORAL_CAPSULE | Freq: Every day | ORAL | Status: DC
  Administered 2017-12-13 – 2017-12-18 (×6): 120 mg via ORAL
  Filled 2017-12-13 (×6): qty 1

## 2017-12-13 MED ORDER — ALLOPURINOL 300 MG PO TABS
300.0000 mg | ORAL_TABLET | Freq: Every day | ORAL | Status: DC
  Administered 2017-12-13 – 2017-12-18 (×6): 300 mg via ORAL
  Filled 2017-12-13 (×6): qty 1

## 2017-12-13 MED ORDER — LOSARTAN POTASSIUM 50 MG PO TABS
100.0000 mg | ORAL_TABLET | Freq: Every day | ORAL | Status: DC
  Administered 2017-12-13 – 2017-12-18 (×6): 100 mg via ORAL
  Filled 2017-12-13 (×6): qty 2

## 2017-12-13 NOTE — Progress Notes (Signed)
Appreciate Dr. Jackalyn Lombard note K 4.3 this afternoon.   WIll allow overnight maintenance of K and check QT in am.  On EKG appears shorter than one measured by EKG.     WIll review with EP and hopefully start Tikosyn in am.   W. Doristine Church MD [facility]

## 2017-12-13 NOTE — Consult Note (Addendum)
Cardiology Consultation:   Patient ID: Adam [name]; [id number]; January 07, 1951   Admit date: 12/12/2017 Date of Consult: 12/13/2017  Primary Care Provider: [name], MD Primary Cardiologist: Dr. Wynonia [name] Primary Electrophysiologist:  New to Dr. Rayann [name] today   Patient Profile:   Adam [name] is a 67 y.o. male with a hx of PTSD ([name] CABG), extensive hx of CAD( MI 1996 with PTCA associated with cardiac arrest, over the years had total of 25 interventions/stents per the patient leading to CABG in 2011, no intervention since), COPD, HTN, HLD, RLS, chronic CHF (diastolic), and PAFib admitted for Tikosyn initiation with Dr. Wynonia [name], lwho is being seen today for the evaluation of AFib ablation at the request of Dr. Wynonia [name].  History of Present Illness:   Adam [name] was admitted yesterday for Tikosyn initiation, EP service is asked to consult/discuss AFib ablation as potential adjunct to his AF management .  The patient recalls [name] CABG 2011 hearing mention of AFib, was started on amiodarone though "my face turned purple" and was stopped.  No awareness or known AFib again until Nov/Dec he struggled with a recurrent bronchitis/COPD exacerbation felt weak, having palpitations that were very anxiety provoking.  With monitoring found to be afib.  He was started on Eliquis he says probably in Jan. And reports takes it religiously given his wife has a AFib associated stroke.  His reports his AFib makes him feel awful, very weak, the palpitations are very anxiety provoking/and uncomfortable for him.  He has no energy when he is in AFib.  The patient denies snoring or hx of sleep apnea, denies much if any in the way of stimulant/caffine intake, he does drink usually 3 scotch drinks nightly, he is obese.  Past Medical History:  Diagnosis Date  . Allergy   . Anxiety    history of PTSD following CABG  . Arthritis   . Asthma   . Colitis- colonoscopy 2014 07/13/2015  . COPD (chronic obstructive  pulmonary disease) ([place])   . Coronary artery disease    x 6  . Depression   . Family history of polyps in the colon   . GERD (gastroesophageal reflux disease)   . Gout   . H/O atrial fibrillation without current medication    following CABG with no documented episodes since then.  Marland Kitchen Hx of adenomatous colonic polyps 08/12/2010  . Hyperlipidemia   . Hypertension   . Prediabetes   . RLS (restless legs syndrome)   . 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 APPENDECTOMY    . COLONOSCOPY W/ BIOPSIES    . CORONARY ANGIOPLASTY WITH STENT PLACEMENT    . CORONARY ARTERY BYPASS GRAFT    . TOTAL HIP ARTHROPLASTY        Inpatient Medications: Scheduled Meds: . allopurinol  300 mg Oral Daily  . apixaban  5 mg Oral BID  . atorvastatin  40 mg Oral q1800  . canagliflozin  100 mg Oral QAC breakfast  . diltiazem  120 mg Oral Daily  . fenofibrate  160 mg Oral Daily  . fluticasone  1 spray Each Nare Daily  . fluticasone furoate-vilanterol  1 puff Inhalation Daily  . insulin aspart  0-15 Units Subcutaneous TID WC  . insulin glargine  70 Units Subcutaneous Daily  . losartan  100 mg Oral Daily  . metFORMIN  1,000 mg Oral BID WC  . metoprolol tartrate  100 mg Oral BID  .  montelukast  10 mg Oral QHS  . pantoprazole  40 mg Oral Q1200  . potassium chloride  40 mEq Oral Q4H  . pramipexole  0.25 mg Oral QHS  . sodium chloride flush  3 mL Intravenous Q12H  . tiotropium  18 mcg Inhalation Daily   Continuous Infusions: . sodium chloride     PRN Meds: sodium chloride, acetaminophen, albuterol, LORazepam, ondansetron (ZOFRAN) IV, sodium chloride flush  Allergies:    Allergies  Allergen Reactions  . Clindamycin/Lincomycin Rash  . Keflex [Cephalexin] Rash    rash    Social History:   Social History   Socioeconomic History  . Marital status: Married    Spouse name: Not on file  . Number of children: Not on file  . Years of  education: Not on file  . Highest education level: Not on file  Social Needs  . Financial resource strain: Not on file  . Food insecurity - worry: Not on file  . Food insecurity - inability: Not on file  . Transportation needs - medical: Not on file  . Transportation needs - non-medical: Not on file  Occupational History  . Occupation: retired  Tobacco Use  . Smoking status: Former Smoker    Packs/day: 1.00    Years: 7.00    Pack years: 7.00    Types: Cigarettes    Last attempt to quit: 10/04/1977    Years since quitting: 40.2  . Smokeless tobacco: Never Used  . Tobacco comment: never smoked over 1 pack   Substance and Sexual Activity  . Alcohol use: Yes    Comment: stopped wine and now drinks 3 to 4 scotches   . Drug use: No    Comment: Smoked Marijuana back in 1970s  . Sexual activity: Yes  Other Topics Concern  . Not on file  Social History Narrative   Married, no children   Textile industry x yrs   52 off early 60's and 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 work - returned to [employer] from [place] 2014    Family History:    Family History  Problem Relation Age of Onset  . Colon cancer Mother   . Heart disease Paternal Grandmother   . Heart disease Paternal Grandfather      ROS:  Please see the history of present illness.  All other ROS reviewed and negative.     Physical Exam/Data:   Vitals:   12/13/17 0541 12/13/17 0858 12/13/17 1018 12/13/17 1019  BP: 124/90  (!) 142/87   Pulse: 85   (!) 104  Resp: 18     Temp: (!) 97.3 F (36.3 C)     TempSrc: Axillary     SpO2: 100% 98%    Weight: 259 lb 3.2 oz (117.6 kg)     Height:        Intake/Output Summary (Last 24 hours) at 12/13/2017 1102 Last data filed at 12/13/2017 0500 Gross per 24 hour  Intake -  Output 700 ml  Net -700 ml   Filed Weights   12/12/17 1557 12/13/17 0541  Weight: 263 lb 11.2 oz (119.6 kg) 259 lb 3.2 oz (117.6 kg)   Body mass index is 37.19 kg/m.  General:  Well nourished, well developed, in no acute  distress HEENT: normal Lymph: no adenopathy Neck: no JVD Endocrine:  No thryomegaly Vascular: No carotid bruits  Cardiac: iRRR; soft SM, no gallops or rubs Lungs:  CTA, no wheezing, rhonchi or rales  Abd: soft, nontender, obese  Ext: no edema Musculoskeletal:  No deformities Skin: warm and dry  Neuro:   No gross focal abnormalities noted Psych:  Normal affect   EKG:  The EKG was personally reviewed and demonstrates:   Afib 80bpm Telemetry:  Telemetry was personally reviewed and demonstrates:   AFib 80's, occ PVCs, couplets  Relevant CV Studies:  Dr. Wynonia [name] refers to an echo done 11/30/11, result is not immediately available  Laboratory Data:  Chemistry Recent Labs  Lab 12/12/17 1642 12/13/17 0349  NA 137 138  K 3.5 3.4*  CL 98* 99*  CO2 24 27  GLUCOSE 134* 117*  BUN 12 15  CREATININE 1.08 1.04  CALCIUM 10.0 9.7  GFRNONAA >60 >60  GFRAA >60 >60  ANIONGAP 15 12    Recent Labs  Lab 12/12/17 1642  PROT 7.5  ALBUMIN 4.0  AST 39  ALT 30  ALKPHOS 28*  BILITOT 1.1   Hematology Recent Labs  Lab 12/12/17 1642  WBC 8.3  RBC 4.50  HGB 15.4  HCT 44.5  MCV 98.9  MCH 34.2*  MCHC 34.6  RDW 13.1  PLT 178   Cardiac EnzymesNo results for input(s): TROPONINI in the last 168 hours. No results for input(s): TROPIPOC in the last 168 hours.  BNPNo results for input(s): BNP, PROBNP in the last 168 hours.  DDimer No results for input(s): DDIMER in the last 168 hours.  Radiology/Studies:  No results found.  Assessment and Plan:   1. AFib, described as paroxysmal     CHA2DS2Vasc is 3, on Eliquis  Discussed ETOH and weight reduction as being a big part of his AF management Recommend updating his echo once back in [place], will defer to Dr. Wynonia [name] Not a 1c candidate Dicussed AFib ablation, indication as a part of AFib management Will plan for EP out patient follow up in 4 weeks  2. Here for Tikosyn load     Dr. Wynonia [name] is replacing K+ and managing with plans for f/u  EKG once corrected     Will defer initiation and management to him   Please recall EP if needed.    For questions or updates, please contact [place] Please consult [email or web address] for contact info under Cardiology/STEMI.   Signed, Baldwin Jamaica, PA-C  12/13/2017 11:02 AM   I have seen, examined the patient, and reviewed the above assessment and plan.  Changes to above are made where necessary.  On exam, iRRR.  Morbidly obese with history of ETOH. Lifestyle modification discussed at length.  Would update echo once in sinus. Agree with tikosyn at this time as per Dr Adam [name].  If he fails medical therapy with tikosyn, could consider ablation, though given his size, ETOH, and persistence of afib, anticipated success rates would be reduced.  I have advised outpatient sleep study given his body habitous, though he is not convinced that this is necessary. Dr Adam [name] to manage while here. I would like to see him in the office in 4 weeks.  Co Sign: [name], MD 12/13/2017 3:06 PM

## 2017-12-13 NOTE — Progress Notes (Signed)
Subjective:  Admitted yesterday to initiate Tikosyn.  He was hypokalemic on admission which hhe had really not been as an outpatient and magnesium was borderline.  Was given potassium last night but is still low this morning.  He says he feels better since he has been in here. In addition his QT is measuring long by EKG.  Visually measured by me does not appear as long as the EKG says that it isand the baseline QT although difficult to measure and atrial fibrillation appears to be around 0.36 to 0.4  Objective:  Vital Signs in the last 24 hours: BP 124/90 (BP Location: Right Arm)   Pulse 85   Temp (!) 97.3 F (36.3 C) (Axillary)   Resp 18   Ht 5\' 10"  (1.778 m)   Wt 117.6 kg (259 lb 3.2 oz)   SpO2 100%   BMI 37.19 kg/m   Physical Exam: Pleasant mildly obese male in no acute distress Lungs:  Clear  Cardiac:  irregular rhythm, normal S1 and S2, no S3 Extremities:  No edema present  Intake/Output from previous day: 03/11 0701 - 03/12 0700 In: -  Out: 700 [Urine:700] Weight Filed Weights   12/12/17 1557 12/13/17 0541  Weight: 119.6 kg (263 lb 11.2 oz) 117.6 kg (259 lb 3.2 oz)    Lab Results: Basic Metabolic Panel: Recent Labs    12/12/17 1642 12/13/17 0349  NA 137 138  K 3.5 3.4*  CL 98* 99*  CO2 24 27  GLUCOSE 134* 117*  BUN 12 15  CREATININE 1.08 1.04    CBC: Recent Labs    12/12/17 1642  WBC 8.3  NEUTROABS 4.8  HGB 15.4  HCT 44.5  MCV 98.9  PLT 178   Telemetry: Atrial fibrillation mostly controlled with occasional rapid response personally reviewed  Assessment/Plan:  1. Persistent atrial fibrillation 2..  Hypokalemia 3.  CAD with previous bypass grafting 4.  Dyspnea secondary to atrial fibrillation  Recommendations:  We had stopped his hydrochlorothiazide and I will give him a total of 80 mEq potassium this morning 40 initially and then 44 hours later.  Recheck this afternoon.  In addition I've asked electrophysiology for consultation regarding  ablation. Recheck EKG following repletion of potassium to be sure QTC is acceptable to initiate Tikosyn.  Previously as an outpatient it had been.  Kerry Hough  MD [facility] Cardiology  12/13/2017, 8:41 AM

## 2017-12-14 ENCOUNTER — Institutional Professional Consult (permissible substitution): Payer: Medicare Other | Admitting: Internal Medicine

## 2017-12-14 ENCOUNTER — Encounter (HOSPITAL_COMMUNITY): Payer: Self-pay

## 2017-12-14 ENCOUNTER — Other Ambulatory Visit: Payer: Self-pay

## 2017-12-14 LAB — BASIC METABOLIC PANEL
ANION GAP: 10 (ref 5–15)
Anion gap: 9 (ref 5–15)
BUN: 14 mg/dL (ref 6–20)
BUN: 13 mg/dL (ref 6–20)
CHLORIDE: 106 mmol/L (ref 101–111)
CO2: 23 mmol/L (ref 22–32)
CO2: 23 mmol/L (ref 22–32)
CREATININE: 0.96 mg/dL (ref 0.61–1.24)
Calcium: 9.5 mg/dL (ref 8.9–10.3)
Calcium: 9.7 mg/dL (ref 8.9–10.3)
Chloride: 106 mmol/L (ref 101–111)
Creatinine, Ser: 1.12 mg/dL (ref 0.61–1.24)
GFR calc Af Amer: >60 mL/min (ref >60)
GFR calc non Af Amer: >60 mL/min (ref >60)
Glucose, Bld: 136 mg/dL — ABNORMAL HIGH (ref 65–99)
Glucose, Bld: 149 mg/dL — ABNORMAL HIGH (ref 65–99)
POTASSIUM: 3.6 mmol/L (ref 3.5–5.1)
POTASSIUM: 4.9 mmol/L (ref 3.5–5.1)
SODIUM: 139 mmol/L (ref 135–145)
SODIUM: 138 mmol/L (ref 135–145)

## 2017-12-14 LAB — GLUCOSE, CAPILLARY
GLUCOSE-CAPILLARY: 160 mg/dL — AB (ref 65–99)
GLUCOSE-CAPILLARY: 146 mg/dL — AB (ref 65–99)
Glucose-Capillary: 140 mg/dL — ABNORMAL HIGH (ref 65–99)
Glucose-Capillary: 136 mg/dL — ABNORMAL HIGH (ref 65–99)

## 2017-12-14 LAB — MAGNESIUM: Magnesium: 2.1 mg/dL (ref 1.7–2.4)

## 2017-12-14 MED ORDER — POTASSIUM CHLORIDE CRYS ER 20 MEQ PO TBCR
40.0000 meq | EXTENDED_RELEASE_TABLET | Freq: Every day | ORAL | Status: DC
Start: 2017-12-15 — End: 2017-12-15
  Filled 2017-12-14: qty 2

## 2017-12-14 MED ORDER — DOFETILIDE 500 MCG PO CAPS
500.0000 ug | ORAL_CAPSULE | Freq: Two times a day (BID) | ORAL | Status: DC
  Administered 2017-12-14: 500 ug via ORAL
  Filled 2017-12-14: qty 1

## 2017-12-14 MED ORDER — POTASSIUM CHLORIDE 20 MEQ/15ML (10%) PO SOLN
40.0000 meq | ORAL | Status: AC
  Administered 2017-12-14 (×2): 40 meq via ORAL
  Filled 2017-12-14 (×2): qty 30

## 2017-12-14 NOTE — Care Management Note (Addendum)
Case Management Note  Patient Details  Name: Adam [name] MRN: [id number] Date of Birth: 26-Oct-1950  Subjective/Objective: Pt presented for Tikosyn Load. Benefits check completed and CM will make pt aware of cost. CM will assist with the Rx for 7 day supply no refills. Pt will need original Rx with refills.                    Action/Plan: S/W  KATE @ [facility] # [phone]   3. TIKOSYN  125 MCG 250 MCG 500 MCG BID  COVER- NONE FORMULARY  PRIOR APPROVAL- YES  # [phone] OPT- 5    2. DOFETILIDE 125 MCG  250 MCG  500 MCG BID  COVER- YES  CO-PAY-  25 % OF TOTAL Cost TIER- 4 DRUG  PRIOR APPROVAL- NO  PREFERRED DRUG  DEDUCTIBLE : MET /  COVERAGE GAP   PREFERRED PHARMACY : [facility] , [facility] AND [facility]   Expected Discharge Date:                  Expected Discharge Plan:  Home/Self Care  In-House Referral:  NA  Discharge planning Services  CM Consult, Medication Assistance  Post Acute Care Choice:  NA Choice offered to:  NA  DME Arranged:  N/A DME Agency:  NA  HH Arranged:  NA HH Agency:  NA  Status of Service:  Completed, signed off  If discussed at [place] of Stay Meetings, dates discussed:    Additional Comments: 1158 12-16-17 Jacqlyn Krauss, RN,BSN [phone] CM did speak with patient to make him aware that his co pay will be between 25-51% of total Cost. Pt was fine with this. CM did call [facility] [place] and medication can be ordered. No further needs from this CM.  Bethena Roys, RN 12/14/2017, 3:16 PM

## 2017-12-14 NOTE — Progress Notes (Addendum)
@  0459 pt had 2.73 second pause. Strip saved by [facility]. Will convey to Day Team to pass on to MD.  @0537  Paged Attending on-call regarding above and pt's K+ of 3.6. Replacement ordered. Will administer and continue to monitor.

## 2017-12-14 NOTE — Progress Notes (Signed)
Potassium is normal again.  QT is measured by EKG appears to be around 0.4.  I reviewed this with Dr. Rayann Heman and we discussed going ahead and initiating Tikosyn and monitoring QT.  We will give the first dose at 7 PM tonight and continue to monitor.  Tentatively planned cardioversion for Friday if fails to convert.  Kerry Hough MD [facility] 4:53 PM

## 2017-12-14 NOTE — Progress Notes (Signed)
Subjective:  Patient is not symptomatic but is having a lot of difficulty with hypokalemia.  Remains in atrial fibrillation.  Had a pause of 2.73 seconds last night.have not been able to initiate Tikosyn because of the hypokalemia yet.  We did not put in last night because of concern about the borderline QT.  QTC is measured by EKG has been longer by computer but visually again does not appear to be as prolonged.  Objective:  Vital Signs in the last 24 hours: BP 109/85 (BP Location: Left Arm)   Pulse 89   Temp (!) 97.4 F (36.3 C) (Oral)   Resp 17   Ht 5\' 10"  (1.778 m)   Wt 117.9 kg (260 lb)   SpO2 98%   BMI 37.31 kg/m   Physical Exam: Pleasant obese male in no acute distress Lungs:  Clear  Cardiac:  irregular rhythm, normal S1 and S2, no S3 Extremities:  No edema present  Intake/Output from previous day: 03/12 0701 - 03/13 0700 In: 538 [P.O.:538] Out: -  Weight Filed Weights   12/12/17 1557 12/13/17 0541 12/14/17 0610  Weight: 119.6 kg (263 lb 11.2 oz) 117.6 kg (259 lb 3.2 oz) 117.9 kg (260 lb)    Lab Results: Basic Metabolic Panel: Recent Labs    12/13/17 1424 12/14/17 0356  NA 139 139  K 4.3 3.6  CL 105 106  CO2 25 23  GLUCOSE 161* 136*  BUN 15 14  CREATININE 1.17 0.96    CBC: Recent Labs    12/12/17 1642  WBC 8.3  NEUTROABS 4.8  HGB 15.4  HCT 44.5  MCV 98.9  PLT 178   Telemetry: Atrial fibrillation  Controlled"2.73 second pause last evening. Assessment/Plan:  1. Persistent atrial fibrillation 2..  Hypokalemia 3.  CAD with previous bypass grafting 4.  Dyspnea secondary to atrial fibrillation  Recommendations:  He is already received an additional dose of potassium this morning and we will check EKG and potassium again 2 hours after the last dose.  He is quite symptomatic in atrial fibrillation.  Kerry Hough  MD [facility] Cardiology  12/14/2017, 8:35 AM

## 2017-12-14 NOTE — Progress Notes (Signed)
Pharmacy Review for Dofetilide (Tikosyn) Initiation  Admit Complaint: 67 y.o. male admitted 12/12/2017 with atrial fibrillation to be initiated on dofetilide.   Assessment: Patient Exclusion Criteria: If any screening criteria checked as "Yes", then  patient  should NOT receive dofetilide until criteria item is corrected. If "Yes" please indicate correction plan.  YES  NO Patient  Exclusion Criteria Correction Plan  [x]  []  Baseline QTc interval is greater than or equal to 440 msec. IF above YES box checked dofetilide contraindicated unless patient has ICD; then may proceed if QTc 500-550 msec or with known ventricular conduction abnormalities may proceed with QTc 550-600 msec. QTc =  489 Dr. Wynonia Lawman aware.  Notes that QTC is measured by EKG has been longer by computer but visually again does not appear to be as prolonged.    []  [x]  Magnesium level is less than 1.8 mEq/l : Last magnesium:  Lab Results  Component Value Date   MG 2.1 12/14/2017         []  [x]  Potassium level is less than 4 mEq/l : Last potassium:  Lab Results  Component Value Date   K 4.9 12/14/2017         []  [x]  Patient is known or suspected to have a digoxin level greater than 2 ng/ml: No results found for: DIGOXIN    []  [x]  Creatinine clearance less than 20 ml/min (calculated using Cockcroft-Gault, actual body weight and serum creatinine): Estimated Creatinine Clearance: 82.4 mL/min (by C-G formula based on SCr of 1.12 mg/dL).    []  [x]  Patient has received drugs known to prolong the QT intervals within the last 48 hours (phenothiazines, tricyclics or tetracyclic antidepressants, erythromycin, H-1 antihistamines, cisapride, fluoroquinolones, azithromycin). Drugs not listed above may have an, as yet, undetected potential to prolong the QT interval, updated information on QT prolonging agents is available at this website:QT prolonging agents   []  [x]  Patient received a dose of hydrochlorothiazide (Oretic) alone or in  any combination including triamterene (Dyazide, Maxzide) in the last 48 hours. HCTZ stopped by Dr. Wynonia Lawman, Last took losartan-HCTZ  3/11 AM. Dr. Wynonia Lawman aware.  []  [x]  Patient received a medication known to increase dofetilide plasma concentrations prior to initial dofetilide dose:  . Trimethoprim (Primsol, Proloprim) in the last 36 hours . Verapamil (Calan, Verelan) in the last 36 hours or a sustained release dose in the last 72 hours . Megestrol (Megace) in the last 5 days  . Cimetidine (Tagamet) in the last 6 hours . Ketoconazole (Nizoral) in the last 24 hours . Itraconazole (Sporanox) in the last 48 hours  . Prochlorperazine (Compazine) in the last 36 hours    []  [x]  Patient is known to have a history of torsades de pointes; congenital or acquired long QT syndromes.   []  [x]  Patient has received a Class 1 antiarrhythmic with less than 2 half-lives since last dose. (Disopyramide, Quinidine, Procainamide, Lidocaine, Mexiletine, Flecainide, Propafenone)   []  [x]  Patient has received amiodarone therapy in the past 3 months or amiodarone level is greater than 0.3 ng/ml.    Patient has been appropriately anticoagulated with Eliquis.  Ordering provider was confirmed at [email or web address] if they are not listed on the [place] Prescribers list.  Goal of Therapy: Follow renal function, electrolytes, potential drug interactions, and dose adjustment. Provide education and 1 week supply at discharge.  Plan:  [x]   Physician selected initial dose within range recommended for patients level of renal function - will monitor for response.  []   Physician selected initial dose  outside of range recommended for patients level of renal function - will discuss if the dose should be altered at this time.   [facility] One Calculated CrCl  Dose q12h  [x]  > 60 ml/min 500 mcg  []  40-60 ml/min 250 mcg  []  20-40 ml/min 125 mcg    Goal of Therapy: Follow renal function, electrolytes, potential drug  interactions, and dose adjustment. Provide education and 1 week supply at discharge.  Plan:  Follow up QTc after the first 5 doses, renal function, electrolytes (K & Mg) daily x 3  days, dose adjustment, success of initiation and facilitate 1 week discharge supply as  clinically indicated.. Will monitor for plans of starting with cardiology.   Nicole Cella, [place] Clinical Pharmacist Pager: [phone] or [phone]) Main Rx [phone]  3:28 PM 12/14/2017

## 2017-12-15 LAB — BASIC METABOLIC PANEL
ANION GAP: 11 (ref 5–15)
BUN: 13 mg/dL (ref 6–20)
CO2: 22 mmol/L (ref 22–32)
Calcium: 9.3 mg/dL (ref 8.9–10.3)
Chloride: 104 mmol/L (ref 101–111)
Creatinine, Ser: 0.96 mg/dL (ref 0.61–1.24)
Glucose, Bld: 118 mg/dL — ABNORMAL HIGH (ref 65–99)
POTASSIUM: 3.7 mmol/L (ref 3.5–5.1)
SODIUM: 137 mmol/L (ref 135–145)

## 2017-12-15 LAB — GLUCOSE, CAPILLARY
GLUCOSE-CAPILLARY: 119 mg/dL — AB (ref 65–99)
GLUCOSE-CAPILLARY: 113 mg/dL — AB (ref 65–99)
Glucose-Capillary: 136 mg/dL — ABNORMAL HIGH (ref 65–99)
Glucose-Capillary: 93 mg/dL (ref 65–99)

## 2017-12-15 LAB — MAGNESIUM: MAGNESIUM: 1.9 mg/dL (ref 1.7–2.4)

## 2017-12-15 LAB — POTASSIUM: Potassium: 4.4 mmol/L (ref 3.5–5.1)

## 2017-12-15 MED ORDER — POTASSIUM CHLORIDE 20 MEQ/15ML (10%) PO SOLN
40.0000 meq | Freq: Once | ORAL | Status: AC
  Administered 2017-12-15: 40 meq via ORAL
  Filled 2017-12-15: qty 30

## 2017-12-15 MED ORDER — DOFETILIDE 250 MCG PO CAPS
250.0000 ug | ORAL_CAPSULE | Freq: Two times a day (BID) | ORAL | Status: DC
  Administered 2017-12-15 (×2): 250 ug via ORAL
  Filled 2017-12-15 (×2): qty 1

## 2017-12-15 MED ORDER — POTASSIUM CHLORIDE CRYS ER 20 MEQ PO TBCR
40.0000 meq | EXTENDED_RELEASE_TABLET | Freq: Every day | ORAL | Status: DC
  Administered 2017-12-15 – 2017-12-17 (×3): 40 meq via ORAL
  Filled 2017-12-15 (×3): qty 2

## 2017-12-15 NOTE — Progress Notes (Signed)
Patient's post-tikosyn afternoon EKG did not transfer into Epic. Hard copy is in patient's chart.

## 2017-12-15 NOTE — Progress Notes (Signed)
Cardiology Moonlighter Note  Dofetilide dose reduced from 500 mcg BID to 250 mcg BID given prolongation of QTc after first dose. Will continue to monitor after subsequent dose.   Marcie Mowers, MD Cardiology Fellow, PGY-5

## 2017-12-15 NOTE — Progress Notes (Signed)
@  0013 Paged Dr. Dallas Breeding on-call for attending regarding pt's EKG Post-1st Dose Tikosyn (QRS 80, QTc 528). Orders received, will continue to monitor.

## 2017-12-15 NOTE — Progress Notes (Signed)
Subjective:  Converted to sinus rhythm after first dose of Tikosyn last night.  Some QT prolongation now but in sinus rhythm.  Having a lot of premature atrial beats as well as PVCs.  Potassium 3.7 again this morning.walks it difficult to tell if major change in symptoms.  Objective:  Vital Signs in the last 24 hours: BP 116/84 (BP Location: Right Arm)   Pulse 71   Temp 98.8 F (37.1 C) (Oral)   Resp 17   Ht 5\' 10"  (1.778 m)   Wt 117.7 kg (259 lb 6.4 oz)   SpO2 97%   BMI 37.22 kg/m   Physical Exam: Pleasant obese male in no acute distress Lungs:  Clear  Cardiac:  irregular rhythm, normal S1 and S2, no S3 Extremities:  No edema present  Intake/Output from previous day: 03/13 0701 - 03/14 0700 In: 1080 [P.O.:1080] Out: -  Weight Filed Weights   12/13/17 0541 12/14/17 0610 12/15/17 0555  Weight: 117.6 kg (259 lb 3.2 oz) 117.9 kg (260 lb) 117.7 kg (259 lb 6.4 oz)    Lab Results: Basic Metabolic Panel: Recent Labs    12/14/17 1121 12/15/17 0504  NA 138 137  K 4.9 3.7  CL 106 104  CO2 23 22  GLUCOSE 149* 118*  BUN 13 13  CREATININE 1.12 0.96    CBC: Recent Labs    12/12/17 1642  WBC 8.3  NEUTROABS 4.8  HGB 15.4  HCT 44.5  MCV 98.9  PLT 178   Telemetry: Sinus rhythm with PVCs as well as some PACs.  Assessment/Plan:  1. Persistent atrial fibrillation converted to sinus rhythm following first dose of Tikosyn 2..  Hypokalemia 3.  CAD with previous bypass grafting 4.  Dyspnea secondary to atrial fibrillation  Recommendations:  I asked EP to weigh in on patient again with borderline prolonged QT.  Tikosyn dose was reduced because of QT prolongation.  Replace potassium this morning.  We'll recheck in 2 hours and if up to go ahead and give Tikosyn again.  Will give potassium dose in the afternoon to hopefully prevent morning hypokalemia.  Kerry Hough  MD [facility] Cardiology  12/15/2017, 8:48 AM

## 2017-12-15 NOTE — Progress Notes (Signed)
Progress Note   Subjective   Doing well today, the patient denies CP or SOB.  No new concerns  Inpatient Medications    Scheduled Meds: . allopurinol  300 mg Oral Daily  . apixaban  5 mg Oral BID  . atorvastatin  40 mg Oral q1800  . canagliflozin  100 mg Oral QAC breakfast  . diltiazem  120 mg Oral Daily  . dofetilide  250 mcg Oral Q12H  . fenofibrate  160 mg Oral Daily  . fluticasone  1 spray Each Nare Daily  . fluticasone furoate-vilanterol  1 puff Inhalation Daily  . insulin aspart  0-15 Units Subcutaneous TID WC  . insulin glargine  70 Units Subcutaneous Daily  . losartan  100 mg Oral Daily  . metFORMIN  1,000 mg Oral BID WC  . metoprolol tartrate  100 mg Oral BID  . montelukast  10 mg Oral QHS  . pantoprazole  40 mg Oral Q1200  . potassium chloride  40 mEq Oral Daily  . pramipexole  0.25 mg Oral QHS  . sodium chloride flush  3 mL Intravenous Q12H  . tiotropium  18 mcg Inhalation Daily   Continuous Infusions: . sodium chloride     PRN Meds: sodium chloride, acetaminophen, albuterol, LORazepam, ondansetron (ZOFRAN) IV, sodium chloride flush   Vital Signs    Vitals:   12/14/17 1947 12/14/17 2000 12/14/17 2240 12/15/17 0555  BP:  103/70 139/90 116/84  Pulse: (!) 102 90  71  Resp: 16 17    Temp:  97.8 F (36.6 C)  98.8 F (37.1 C)  TempSrc:  Oral  Oral  SpO2: 97% 97%    Weight:    259 lb 6.4 oz (117.7 kg)  Height:        Intake/Output Summary (Last 24 hours) at 12/15/2017 1126 Last data filed at 12/15/2017 0000 Gross per 24 hour  Intake 840 ml  Output -  Net 840 ml   Filed Weights   12/13/17 0541 12/14/17 0610 12/15/17 0555  Weight: 259 lb 3.2 oz (117.6 kg) 260 lb (117.9 kg) 259 lb 6.4 oz (117.7 kg)    Telemetry    Sinus rhythm with frequent salvos of afib - Personally Reviewed  Physical Exam   GEN- The patient is well appearing, alert and oriented x 3 today.   Head- normocephalic, atraumatic Eyes-  Sclera clear, conjunctiva pink Ears-  hearing intact Oropharynx- clear Neck- supple, Lungs- Clear to ausculation bilaterally, normal work of breathing Heart- Regular rate and rhythm  GI- soft, NT, ND, + BS Extremities- no clubbing, cyanosis, or edema  MS- no significant deformity or atrophy Skin- no rash or lesion Psych- euthymic mood, full affect Neuro- strength and sensation are intact   Labs    Chemistry Recent Labs  Lab 12/12/17 1642  12/14/17 0356 12/14/17 1121 12/15/17 0504  NA 137   < > 139 138 137  K 3.5   < > 3.6 4.9 3.7  CL 98*   < > 106 106 104  CO2 24   < > 23 23 22   GLUCOSE 134*   < > 136* 149* 118*  BUN 12   < > 14 13 13   CREATININE 1.08   < > 0.96 1.12 0.96  CALCIUM 10.0   < > 9.5 9.7 9.3  PROT 7.5  --   --   --   --   ALBUMIN 4.0  --   --   --   --   AST 39  --   --   --   --  ALT 30  --   --   --   --   ALKPHOS 28*  --   --   --   --   BILITOT 1.1  --   --   --   --   GFRNONAA >60   < > >60 >60 >60  GFRAA >60   < > >60 >60 >60  ANIONGAP 15   < > 10 9 11    < > = values in this interval not displayed.     Hematology Recent Labs  Lab 12/12/17 1642  WBC 8.3  RBC 4.50  HGB 15.4  HCT 44.5  MCV 98.9  MCH 34.2*  MCHC 34.6  RDW 13.1  PLT 178    Cardiac EnzymesNo results for input(s): TROPONINI in the last 168 hours. No results for input(s): TROPIPOC in the last 168 hours.      Assessment & Plan    1.  Persistent afib He has converted to sinus rhythm Am ekg reveals qtc 440 msec by my calculation.  Would continue tikosyn 500 mcg BID.  Looks like [name] may have reduced dose to 250 mcg however.  Ok to give this am's dose from my perspective.  Continue anticoagualtion with eliquis for chads2vasc score of 3. Once more stable in sinus, would reduce metoprolol due to bradycardia  2. Hypokalemia [name] working to replete  EP to follow along  [name] MD, [facility] 12/15/2017 11:26 AM

## 2017-12-15 NOTE — Progress Notes (Addendum)
Resent benefit check for  dofetilide (TIKOSYN) capsule 250 mcg  Dose: 250 mcg Freq: Every 12 hours Route: PO  Due to dose adjustment  [place], [employer], Paislei Dorval, RN  Cc: Nestor Lewandowsky, RN        Per rep at [employer]:   [place] name brand is not covered   Generic is covered, no auth required, $304 co-pay at [facility] (Where patient normally goes) for 30 day supply -- patient is in coverage gap they pay around 51% of total cost of any generic medication And 35% of cost of any covered brand name medication

## 2017-12-16 ENCOUNTER — Other Ambulatory Visit: Payer: Self-pay | Admitting: Family Medicine

## 2017-12-16 ENCOUNTER — Encounter (HOSPITAL_COMMUNITY)
Admission: RE | Disposition: A | Discharge status: Home / Self Care | Payer: Self-pay | Source: Ambulatory Visit | Attending: Cardiology

## 2017-12-16 LAB — BASIC METABOLIC PANEL
ANION GAP: 9 (ref 5–15)
BUN: 12 mg/dL (ref 6–20)
CALCIUM: 9.6 mg/dL (ref 8.9–10.3)
CO2: 23 mmol/L (ref 22–32)
Chloride: 106 mmol/L (ref 101–111)
Creatinine, Ser: 1.09 mg/dL (ref 0.61–1.24)
GFR calc Af Amer: >60 mL/min (ref >60)
Glucose, Bld: 115 mg/dL — ABNORMAL HIGH (ref 65–99)
POTASSIUM: 4.1 mmol/L (ref 3.5–5.1)
SODIUM: 138 mmol/L (ref 135–145)

## 2017-12-16 LAB — GLUCOSE, CAPILLARY
GLUCOSE-CAPILLARY: 105 mg/dL — AB (ref 65–99)
Glucose-Capillary: 119 mg/dL — ABNORMAL HIGH (ref 65–99)
Glucose-Capillary: 107 mg/dL — ABNORMAL HIGH (ref 65–99)
Glucose-Capillary: 112 mg/dL — ABNORMAL HIGH (ref 65–99)

## 2017-12-16 LAB — MAGNESIUM: MAGNESIUM: 2 mg/dL (ref 1.7–2.4)

## 2017-12-16 SURGERY — CARDIOVERSION
Anesthesia: General

## 2017-12-16 MED ORDER — DOFETILIDE 125 MCG PO CAPS
125.0000 ug | ORAL_CAPSULE | Freq: Two times a day (BID) | ORAL | Status: DC
  Administered 2017-12-16 – 2017-12-18 (×5): 125 ug via ORAL
  Filled 2017-12-16 (×5): qty 1

## 2017-12-16 NOTE — Care Management Important Message (Signed)
Important Message  Patient Details  Name: EITHEN CASTIGLIA MRN: [id number] Date of Birth: October 31, 1950   Medicare Important Message Given:  Yes    Barb Merino Glasco 12/16/2017, 3:41 PM

## 2017-12-16 NOTE — Progress Notes (Signed)
Progress Note   Subjective   Doing well today, the patient denies CP or SOB.  No new concerns  Inpatient Medications    Scheduled Meds: . allopurinol  300 mg Oral Daily  . apixaban  5 mg Oral BID  . atorvastatin  40 mg Oral q1800  . canagliflozin  100 mg Oral QAC breakfast  . diltiazem  120 mg Oral Daily  . dofetilide  250 mcg Oral Q12H  . fenofibrate  160 mg Oral Daily  . fluticasone  1 spray Each Nare Daily  . fluticasone furoate-vilanterol  1 puff Inhalation Daily  . insulin aspart  0-15 Units Subcutaneous TID WC  . insulin glargine  70 Units Subcutaneous Daily  . losartan  100 mg Oral Daily  . metFORMIN  1,000 mg Oral BID WC  . metoprolol tartrate  100 mg Oral BID  . montelukast  10 mg Oral QHS  . pantoprazole  40 mg Oral Q1200  . potassium chloride  40 mEq Oral Daily  . pramipexole  0.25 mg Oral QHS  . sodium chloride flush  3 mL Intravenous Q12H  . tiotropium  18 mcg Inhalation Daily   Continuous Infusions: . sodium chloride     PRN Meds: sodium chloride, acetaminophen, albuterol, LORazepam, ondansetron (ZOFRAN) IV, sodium chloride flush   Vital Signs    Vitals:   12/15/17 0820 12/15/17 1507 12/15/17 2209 12/16/17 0532  BP: 133/87 121/81 128/84 129/89  Pulse:  (!) 55 86 86  Resp:   18 18  Temp:  97.6 F (36.4 C) 98 F (36.7 C)   TempSrc:  Axillary Oral   SpO2:  98% 95% 97%  Weight:      Height:        Intake/Output Summary (Last 24 hours) at 12/16/2017 0800 Last data filed at 12/15/2017 2200 Gross per 24 hour  Intake 720 ml  Output -  Net 720 ml   Filed Weights   12/13/17 0541 12/14/17 0610 12/15/17 0555  Weight: 259 lb 3.2 oz (117.6 kg) 260 lb (117.9 kg) 259 lb 6.4 oz (117.7 kg)    Telemetry    Sinus with PACs, PVCs - Personally Reviewed  Physical Exam   GEN- The patient is well appearing, alert and oriented x 3 today.   Head- normocephalic, atraumatic Eyes-  Sclera clear, conjunctiva pink Ears- hearing intact Oropharynx-  clear Neck- supple, Lungs- Clear to ausculation bilaterally, normal work of breathing Heart- Regular rate and rhythm  GI- soft, NT, ND, + BS Extremities- no clubbing, cyanosis, or edema    Labs    Chemistry Recent Labs  Lab 12/12/17 1642  12/14/17 0356 12/14/17 1121 12/15/17 0504 12/15/17 1113  NA 137   < > 139 138 137  --   K 3.5   < > 3.6 4.9 3.7 4.4  CL 98*   < > 106 106 104  --   CO2 24   < > 23 23 22   --   GLUCOSE 134*   < > 136* 149* 118*  --   BUN 12   < > 14 13 13   --   CREATININE 1.08   < > 0.96 1.12 0.96  --   CALCIUM 10.0   < > 9.5 9.7 9.3  --   PROT 7.5  --   --   --   --   --   ALBUMIN 4.0  --   --   --   --   --   AST 39  --   --   --   --   --  ALT 30  --   --   --   --   --   ALKPHOS 28*  --   --   --   --   --   BILITOT 1.1  --   --   --   --   --   GFRNONAA >60   < > >60 >60 >60  --   GFRAA >60   < > >60 >60 >60  --   ANIONGAP 15   < > 10 9 11   --    < > = values in this interval not displayed.     Hematology Recent Labs  Lab 12/12/17 1642  WBC 8.3  RBC 4.50  HGB 15.4  HCT 44.5  MCV 98.9  MCH 34.2*  MCHC 34.6  RDW 13.1  PLT 178    Cardiac EnzymesNo results for input(s): TROPONINI in the last 168 hours. No results for input(s): TROPIPOC in the last 168 hours.      Assessment & Plan    1.  Persistent afib Maintaining sinus with tikosyn Qt is a little long today Would reduce tikosyn to 125 mcg BID and keep in hospital another day.  [name] MD, [facility] 12/16/2017 8:00 AM

## 2017-12-16 NOTE — Plan of Care (Signed)
  Activity: Risk for activity intolerance will decrease 12/16/2017 2334 - Completed/Met by Claudine Mouton, RN  Patient up ad-lib ambulates independently with steady gait

## 2017-12-16 NOTE — Progress Notes (Signed)
Paged Dr. [place] Lions with cardiology on call regarding post EKG after 3rd dose of tikosyn at 250 mcg. QTc 516. Pt's rhythm very irregular but still sinus rhythm. No new orders received at this time, will continue to monitor closely.  Jacqlyn Larsen, RN

## 2017-12-16 NOTE — Progress Notes (Signed)
Subjective:  Remaining in NSR.  Tikosyn adjusted for QT out and dosing reduced again today.  Appreciate Dr. Jackalyn Lombard help greatly. Thinks can walk futher down the hall now that in NSR.  Objective:  Vital Signs in the last 24 hours: BP 134/89   Pulse 86   Temp 98 F (36.7 C) (Oral)   Resp 18   Ht 5\' 10"  (1.778 m)   Wt 117.7 kg (259 lb 6.4 oz)   SpO2 96%   BMI 37.22 kg/m   Physical Exam: Pleasant obese male in no acute distress Lungs:  Clear  Cardiac:  irregular rhythm, normal S1 and S2, no S3 Extremities:  No edema present  Intake/Output from previous day: 03/14 0701 - 03/15 0700 In: 720 [P.O.:720] Out: -  Weight Filed Weights   12/13/17 0541 12/14/17 0610 12/15/17 0555  Weight: 117.6 kg (259 lb 3.2 oz) 117.9 kg (260 lb) 117.7 kg (259 lb 6.4 oz)    Lab Results: Basic Metabolic Panel: Recent Labs    12/15/17 0504 12/15/17 1113 12/16/17 0702  NA 137  --  138  K 3.7 4.4 4.1  CL 104  --  106  CO2 22  --  23  GLUCOSE 118*  --  115*  BUN 13  --  12  CREATININE 0.96  --  1.09    Telemetry: Sinus rhythm with PVCs as well as some non stained runs of PVC's  Assessment/Plan:  1. Persistent atrial fibrillation converted to sinus rhythm following first dose of Tikosyn 2..  Hypokalemia better now with afternoon dosing of long acting K 3.  CAD with previous bypass grafting 4.  Dyspnea secondary to atrial fibrillation  Recommendations:  QT looks around 0.44 now with corrected around.5   Keep in house today.   Kerry Hough  MD [facility] Card[facility]  12/16/2017, 1:10 PM

## 2017-12-17 LAB — GLUCOSE, CAPILLARY
Glucose-Capillary: 104 mg/dL — ABNORMAL HIGH (ref 65–99)
Glucose-Capillary: 114 mg/dL — ABNORMAL HIGH (ref 65–99)
Glucose-Capillary: 142 mg/dL — ABNORMAL HIGH (ref 65–99)

## 2017-12-17 LAB — BASIC METABOLIC PANEL
Anion gap: 10 (ref 5–15)
BUN: 12 mg/dL (ref 6–20)
CHLORIDE: 103 mmol/L (ref 101–111)
CO2: 22 mmol/L (ref 22–32)
CREATININE: 1.04 mg/dL (ref 0.61–1.24)
Calcium: 9.5 mg/dL (ref 8.9–10.3)
GFR calc Af Amer: >60 mL/min (ref >60)
GFR calc non Af Amer: >60 mL/min (ref >60)
Glucose, Bld: 110 mg/dL — ABNORMAL HIGH (ref 65–99)
Potassium: 3.8 mmol/L (ref 3.5–5.1)
SODIUM: 135 mmol/L (ref 135–145)

## 2017-12-17 LAB — MAGNESIUM: MAGNESIUM: 2.2 mg/dL (ref 1.7–2.4)

## 2017-12-17 MED ORDER — POTASSIUM CHLORIDE 20 MEQ/15ML (10%) PO SOLN
20.0000 meq | Freq: Once | ORAL | Status: AC
  Administered 2017-12-17: 20 meq via ORAL
  Filled 2017-12-17: qty 15

## 2017-12-17 NOTE — Progress Notes (Signed)
Subjective:  He went back in atrial fibrillation about an hour ago.  He felt it immediately.  His potassium was 3.8 this morning.  Does note some mild shortness of breath since he is in atrial fibrillation.  Objective:  Vital Signs in the last 24 hours: BP 139/90   Pulse (!) 105   Temp (!) 97.5 F (36.4 C) (Oral)   Resp 18   Ht 5\' 10"  (1.778 m)   Wt 116.6 kg (257 lb)   SpO2 94%   BMI 36.88 kg/m   Physical Exam: Pleasant obese male in no acute distress Lungs:  Clear  Cardiac:  irregular rhythm, normal S1 and S2, no S3 Extremities:  No edema present  Intake/Output from previous day: 03/15 0701 - 03/16 0700 In: 1710 [P.O.:1710] Out: -  Weight Filed Weights   12/14/17 0610 12/15/17 0555 12/17/17 0521  Weight: 117.9 kg (260 lb) 117.7 kg (259 lb 6.4 oz) 116.6 kg (257 lb)    Lab Results: Basic Metabolic Panel: Recent Labs    12/16/17 0702 12/17/17 0706  NA 138 135  K 4.1 3.8  CL 106 103  CO2 23 22  GLUCOSE 115* 110*  BUN 12 12  CREATININE 1.09 1.04    Telemetry: Atrial fibrillation, mildly rapid response  Assessment/Plan:  1. Persistent atrial fibrillation converted to sinus rhythm following first dose of Tikosyn but now back in atrial fibrillation 2..  Hypokalemia mildlyrecurrent will supplement 3.  CAD with previous bypass grafting 4.  Dyspnea secondary to atrial fibrillation  Recommendations:  Back in atrial fibrillation.  Discussed with Dr. Rayann Heman who felt his QT is okay after next dose of Tikosyn that he can be discharged and continue follow-up.  We will see what response of atrial fibrillation is to Tikosyn.  Kerry Hough  MD [facility]  12/17/2017, 10:20 AM

## 2017-12-17 NOTE — Progress Notes (Signed)
Progress Note   Subjective   Doing well today, the patient denies CP or SOB.  No new concerns  Inpatient Medications    Scheduled Meds: . allopurinol  300 mg Oral Daily  . apixaban  5 mg Oral BID  . atorvastatin  40 mg Oral q1800  . canagliflozin  100 mg Oral QAC breakfast  . diltiazem  120 mg Oral Daily  . dofetilide  125 mcg Oral Q12H  . fenofibrate  160 mg Oral Daily  . fluticasone  1 spray Each Nare Daily  . fluticasone furoate-vilanterol  1 puff Inhalation Daily  . insulin aspart  0-15 Units Subcutaneous TID WC  . insulin glargine  70 Units Subcutaneous Daily  . losartan  100 mg Oral Daily  . metFORMIN  1,000 mg Oral BID WC  . metoprolol tartrate  100 mg Oral BID  . montelukast  10 mg Oral QHS  . pantoprazole  40 mg Oral Q1200  . potassium chloride  40 mEq Oral Daily  . pramipexole  0.25 mg Oral QHS  . sodium chloride flush  3 mL Intravenous Q12H  . tiotropium  18 mcg Inhalation Daily   Continuous Infusions: . sodium chloride     PRN Meds: sodium chloride, acetaminophen, albuterol, LORazepam, ondansetron (ZOFRAN) IV, sodium chloride flush   Vital Signs    Vitals:   12/16/17 1951 12/16/17 2052 12/16/17 2110 12/17/17 0521  BP:  (!) 138/96 (!) 138/96 98/88  Pulse:  83 86 81  Resp:  18  18  Temp:  98.4 F (36.9 C)  (!) 97.5 F (36.4 C)  TempSrc:  Oral  Oral  SpO2: 96% 96%  97%  Weight:    257 lb (116.6 kg)  Height:        Intake/Output Summary (Last 24 hours) at 12/17/2017 0817 Last data filed at 12/16/2017 2300 Gross per 24 hour  Intake 1710 ml  Output -  Net 1710 ml   Filed Weights   12/14/17 0610 12/15/17 0555 12/17/17 0521  Weight: 260 lb (117.9 kg) 259 lb 6.4 oz (117.7 kg) 257 lb (116.6 kg)    Telemetry    Sinus rhythm with nonsustained AF/ atach - Personally Reviewed  Physical Exam   GEN- The patient is well appearing, alert and oriented x 3 today.   Head- normocephalic, atraumatic Eyes-  Sclera clear, conjunctiva pink Ears- hearing  intact Oropharynx- clear Neck- supple, Lungs- Clear to ausculation bilaterally, normal work of breathing Heart- Regular rate and rhythm  GI- soft, NT, ND, + BS Extremities- no clubbing, cyanosis, or edema  MS- no significant deformity or atrophy Skin- no rash or lesion Psych- euthymic mood, full affect Neuro- strength and sensation are intact   Labs    Chemistry Recent Labs  Lab 12/12/17 1642  12/15/17 0504 12/15/17 1113 12/16/17 0702 12/17/17 0706  NA 137   < > 137  --  138 135  K 3.5   < > 3.7 4.4 4.1 3.8  CL 98*   < > 104  --  106 103  CO2 24   < > 22  --  23 22  GLUCOSE 134*   < > 118*  --  115* 110*  BUN 12   < > 13  --  12 12  CREATININE 1.08   < > 0.96  --  1.09 1.04  CALCIUM 10.0   < > 9.3  --  9.6 9.5  PROT 7.5  --   --   --   --   --  ALBUMIN 4.0  --   --   --   --   --   AST 39  --   --   --   --   --   ALT 30  --   --   --   --   --   ALKPHOS 28*  --   --   --   --   --   BILITOT 1.1  --   --   --   --   --   GFRNONAA >60   < > >60  --  >60 >60  GFRAA >60   < > >60  --  >60 >60  ANIONGAP 15   < > 11  --  9 10   < > = values in this interval not displayed.     Hematology Recent Labs  Lab 12/12/17 1642  WBC 8.3  RBC 4.50  HGB 15.4  HCT 44.5  MCV 98.9  MCH 34.2*  MCHC 34.6  RDW 13.1  PLT 178    Cardiac EnzymesNo results for input(s): TROPONINI in the last 168 hours. No results for input(s): TROPIPOC in the last 168 hours.      Assessment & Plan    1.  Persistent afib Maintaining sinus rhythm with tikosyn Qt appears very stable at this time Would consider discharge if QTc looks good after this am's dose Would advise repeat ekg, mg, and bmet in Dr Thurman Coyer office in 3-5 days  He does still have occasional salvos of afib/ atach which are likely from [facility] source and may suggest good response to ablation.  Lifestyle modification is important  Follow-up with [name] and I am happy to see as needed in the office.   [name] MD,  [facility] 12/17/2017 8:17 AM

## 2017-12-17 NOTE — Plan of Care (Signed)
  Safety: Ability to remain free from injury will improve 12/17/2017 2207 - Completed/Met by Theora Gianotti, RN  Pt remains free from injury on this admission. Educated on use of call light system. Verbalizes understanding of need to call for assistance prior to ambulation when necessary.  Ambulates with steady gait with ease.

## 2017-12-18 LAB — BASIC METABOLIC PANEL
ANION GAP: 11 (ref 5–15)
BUN: 15 mg/dL (ref 6–20)
CHLORIDE: 102 mmol/L (ref 101–111)
CO2: 20 mmol/L — ABNORMAL LOW (ref 22–32)
Calcium: 10 mg/dL (ref 8.9–10.3)
Creatinine, Ser: 1.17 mg/dL (ref 0.61–1.24)
GFR calc Af Amer: >60 mL/min (ref >60)
Glucose, Bld: 220 mg/dL — ABNORMAL HIGH (ref 65–99)
POTASSIUM: 3.9 mmol/L (ref 3.5–5.1)
SODIUM: 133 mmol/L — AB (ref 135–145)

## 2017-12-18 LAB — GLUCOSE, CAPILLARY
GLUCOSE-CAPILLARY: 125 mg/dL — AB (ref 65–99)
GLUCOSE-CAPILLARY: 110 mg/dL — AB (ref 65–99)

## 2017-12-18 MED ORDER — METOPROLOL TARTRATE 5 MG/5ML IV SOLN
5.0000 mg | Freq: Four times a day (QID) | INTRAVENOUS | Status: DC | PRN
  Filled 2017-12-18: qty 5

## 2017-12-18 MED ORDER — POTASSIUM CHLORIDE CRYS ER 20 MEQ PO TBCR: 40.0000 meq | EXTENDED_RELEASE_TABLET | Freq: Every day | ORAL | 12 refills | Status: DC

## 2017-12-18 MED ORDER — DOFETILIDE 125 MCG PO CAPS: 125.0000 ug | ORAL_CAPSULE | Freq: Two times a day (BID) | ORAL | 2 refills | Status: DC

## 2017-12-18 MED ORDER — LOSARTAN POTASSIUM 100 MG PO TABS: 100.0000 mg | ORAL_TABLET | Freq: Every day | ORAL | 12 refills | Status: DC

## 2017-12-18 NOTE — Progress Notes (Signed)
Subjective:  He has remained in atrial fibrillation since yesterday.  Rate is rapid at times it is currently quite rapid. Dyspnea still with activity.  No lab is available from today.  Objective:  Vital Signs in the last 24 hours: BP 133/90   Pulse (!) 135   Temp 97.9 F (36.6 C) (Oral)   Resp 18   Ht 5\' 10"  (1.778 m)   Wt 115.9 kg (255 lb 8 oz)   SpO2 92%   BMI 36.66 kg/m   Physical Exam: Pleasant obese male in no acute distress Lungs:  Clear  Cardiac:  irregular rhythm, normal S1 and S2, no S3 Extremities:  No edema present  Intake/Output from previous day: 03/16 0701 - 03/17 0700 In: 360 [P.O.:360] Out: -  Weight Filed Weights   12/15/17 0555 12/17/17 0521 12/18/17 0544  Weight: 117.7 kg (259 lb 6.4 oz) 116.6 kg (257 lb) 115.9 kg (255 lb 8 oz)    Lab Results: Basic Metabolic Panel: Recent Labs    12/16/17 0702 12/17/17 0706  NA 138 135  K 4.1 3.8  CL 106 103  CO2 23 22  GLUCOSE 115* 110*  BUN 12 12  CREATININE 1.09 1.04    Telemetry: Atrial fibrillation, mildly rapid response  Assessment/Plan:  1. Persistent atrial fibrillation converted to sinus rhythm following first dose of Tikosyn but now back in atrial fibrillation with somewhat rapid response 2..  Hypokalemia mildly recurrent awaiting lab 3.  CAD with previous bypass grafting 4.  Dyspnea secondary to atrial fibrillation  Recommendations:  Discussed again with Dr. Rayann Heman.  Reluctant to increase Tikosyn dose any further.  His recommendations are to let him go home although will need to be sure his rate is controlled prior to going home.  We'll await for medicine to take effect and given a dose of intravenous metoprolol.  Await for lab to come back and possibly let him go home later.  Suspect that he may need to have an ablation.  Kerry Hough  MD [facility] Card[facility]  12/18/2017, 9:27 AM

## 2017-12-18 NOTE — Progress Notes (Signed)
Tikosyn administered at 2105. EKG obtained at [place]. QTc 523

## 2017-12-18 NOTE — Discharge Summary (Signed)
Physician Discharge Summary  Patient ID: Adam [name] MRN: [id number] DOB/AGE: 68-Nov-1952 68 y.o.  Admit date: 12/12/2017 Discharge date: 12/18/2017  Primary Physician:  Dr. Carolann Littler  Primary Discharge Diagnosis:  1.  Persistent atrial fibrillation  Secondary Discharge Diagnosis: 2.  CAD with previous bypass grafting 3.  Severe obesity 4.  COPD 5.  Hypertensive heart disease 6.  Hyperlipidemia 7.  Long-term use of anticoagulation  Consults:  Dr. Evette Cristal Course: This 67 year old male has a history of CAD with previous bypass grafting.  He also has possible sleep apnea as well as some alcohol use and hypertensive heart disease.  He had some postoperative atrial fibrillation following his bypass and more recently noted some worsening dyspnea.  He was found to be in atrial fibrillation office more cardiac event monitor showing multiple episodes in atrial fibrillation.  He was quite symptomatic in atrial fibrillation and had been placed in Eliquis and had not missed any doses of Eliquis.  After discussion of his atrial fibrillation and symptoms of dyspnea and was opted to have him admitted to the hospital for initiation of Tikosyn under monitoring and for further evaluation of atrial fibrillation.  The patient was in atrial fibrillation on admission.  He was hypokalemic on admission and required multiple doses of potassium to replete his potassium prior to initiating Tikosyn.  His QT was borderline for initiation and I consulted Dr. [name] of electrophysiology.  After he reviewed the records he felt it was safe to initiate Tikosyn.  We initially initiated at 500 g and he converted to sinus rhythm after the first dose.  His dose was adjusted because of 2 to prolongation and eventually he wound up with a dose of 0.125 mg twice daily.  He reverted back to atrial fibrillation on the 16th and did have some periods of rapid response.  QT was monitored and it was felt  that we could not tolerate a higher dose of Tikosyn because of the QT prolongation.  He was seen in consultation by Dr. [name] who felt that he could potentially be candidate for ablation but the long-term success would be reduced due to his obesity and potential alcohol abuse and did recommend a sleep study also.  After reversion to atrial fibrillation after having been in sinus rhythm for several days a decision was made to continue his Tikosyn as an outpatient and he will be followed in the office.  If he does not revert back to sinus rhythm he'll be followed up by Dr. Rayann Heman and consideration will be given for cardioversion versus potential ablation in the future.  He was clinically improved but still symptomatic with dyspnea at the time of discharge.  He was instructed to stop his HCTZ and a new prescription for losartan was sent in.  He was educated on the use of Tikosyn while he was in.  He was given prescription for supplemental potassium to take.  He will follow-up with me in the next week and he is to call if there are problems. Discharge Exam: Blood pressure 105/84, pulse (!) 135, temperature 97.9 F (36.6 C), temperature source Oral, resp. rate 18, height 5\' 10"  (1.778 m), weight 115.9 kg (255 lb 8 oz), SpO2 92 %. Weight: 115.9 kg (255 lb 8 oz)  Labs: CBC:   Lab Results  Component Value Date   WBC 8.3 12/12/2017   HGB 15.4 12/12/2017   HCT 44.5 12/12/2017   MCV 98.9 12/12/2017   PLT 178 12/12/2017  CMP:  Recent Labs  Lab 12/12/17 1642  12/18/17 0935  NA 137   < > 133*  K 3.5   < > 3.9  CL 98*   < > 102  CO2 24   < > 20*  BUN 12   < > 15  CREATININE 1.08   < > 1.17  CALCIUM 10.0   < > 10.0  PROT 7.5  --   --   BILITOT 1.1  --   --   ALKPHOS 28*  --   --   ALT 30  --   --   AST 39  --   --   GLUCOSE 134*   < > 220*   < > = values in this interval not displayed.    Lipid Panel     Component Value Date/Time   CHOL 187 06/15/2017 1005   TRIG 263.0 (H)  06/15/2017 1005   HDL 55.80 06/15/2017 1005   CHOLHDL 3 06/15/2017 1005   VLDL 52.6 (H) 06/15/2017 1005   LDLCALC 49 08/14/2013 1128    Cardiac Enzymes: No results for input(s): CKTOTAL, CKMB, CKMBINDEX, TROPONINI in the last 72 hours.  BNP (last 3 results) Recent Labs    10/17/17 1726  PROBNP 241.0*   Thyroid: Lab Results  Component Value Date   TSH 2.87 06/15/2017    Hemoglobin A1C: Lab Results  Component Value Date   HGBA1C 6.7 (H) 12/12/2017    EKG: Initial EKG shows atrial fibrillation with controlled response.  He developed sinus rhythm and QTC was around 48.49.  Follow-up EKG showed atrial fibrillation with some periods of rapid response.  Discharge Medications: Allergies as of 12/18/2017      Reactions   Clindamycin/lincomycin Rash   Keflex [cephalexin] Rash   rash      Medication List    STOP taking these medications   fluticasone furoate-vilanterol 100-25 MCG/INH Aepb Commonly known as:  BREO ELLIPTA   fluticasone furoate-vilanterol 200-25 MCG/INH Aepb Commonly known as:  BREO ELLIPTA   losartan-hydrochlorothiazide 100-25 MG tablet Commonly known as:  HYZAAR Replaced by:  losartan 100 MG tablet     TAKE these medications   albuterol 108 (90 Base) MCG/ACT inhaler Commonly known as:  PROAIR HFA Inhale 1-2 puffs into the lungs every 6 (six) hours as needed for wheezing or shortness of breath.   allopurinol 300 MG tablet Commonly known as:  ZYLOPRIM TAKE 1 TABLET BY MOUTH EVERY DAY   Co Q-10 100 MG Caps Take 2 tablets by mouth daily.   DILT-XR 120 MG 24 hr capsule Generic drug:  diltiazem Take 120 mg by mouth daily.   dofetilide 125 MCG capsule Commonly known as:  TIKOSYN Take 1 capsule (125 mcg total) by mouth every 12 (twelve) hours.   ELIQUIS 5 MG Tabs tablet Generic drug:  apixaban Take 5 mg by mouth 2 (two) times daily.   fenofibrate 160 MG tablet Take 1 tablet (160 mg total) by mouth daily.   fluticasone 50 MCG/ACT nasal  spray Commonly known as:  FLONASE Place 1 spray into the nose daily.   furosemide 20 MG tablet Commonly known as:  LASIX TAKE 1 TABLET(20 MG) BY MOUTH DAILY AS NEEDED FOR LEG SWELLING   Insulin Pen Needle 32G X 4 MM Misc Commonly known as:  BD PEN NEEDLE NANO U/F Use as instructed   JARDIANCE 25 MG Tabs tablet Generic drug:  empagliflozin Take 25 mg by mouth daily.   LORazepam 0.5 MG tablet Commonly known as:  ATIVAN Take tablet every 8 hours as needed for anxiety   losartan 100 MG tablet Commonly known as:  COZAAR Take 1 tablet (100 mg total) by mouth daily. Start taking on:  12/19/2017 Replaces:  losartan-hydrochlorothiazide 100-25 MG tablet   metFORMIN 1000 MG tablet Commonly known as:  GLUCOPHAGE TAKE 1 TABLET BY MOUTH TWICE DAILY WITH A MEAL What changed:  See the new instructions.   metoprolol tartrate 100 MG tablet Commonly known as:  LOPRESSOR Take 100 mg by mouth 2 (two) times daily.   montelukast 10 MG tablet Commonly known as:  SINGULAIR TAKE 1 TABLET BY MOUTH AT BEDTIME What changed:    how much to take  how to take this  when to take this   Olopatadine HCl 0.2 % Soln Commonly known as:  PATADAY Apply 1 drop to eye as needed.   ONETOUCH DELICA LANCETS FINE Misc 1 Device by Other route as needed. Use as instructed.  DX: 250.00   pantoprazole 40 MG tablet Commonly known as:  PROTONIX TAKE 1 TABLET BY MOUTH TWICE DAILY What changed:    how much to take  how to take this  when to take this   potassium chloride SA 20 MEQ tablet Commonly known as:  K-DUR,KLOR-CON Take 2 tablets (40 mEq total) by mouth daily.   pramipexole 0.25 MG tablet Commonly known as:  MIRAPEX Take 1 tablet (0.25 mg total) by mouth at bedtime.   simvastatin 80 MG tablet Commonly known as:  ZOCOR TAKE 1 TABLET BY MOUTH EVERY DAY What changed:    how much to take  how to take this  when to take this   [place] 18 MCG inhalation capsule Generic drug:   tiotropium INHALE CONTENTS OF 1 CAPSULE ONCE DAILY USING HANDIHALER   TOUJEO SOLOSTAR 300 UNIT/ML Sopn Generic drug:  Insulin Glargine INJECT 80 UNITS UNDER THE SKIN AS DIRECTED What changed:  See the new instructions.       Followup plans and appointments: Follow-up with Dr. Wynonia Lawman in the end of this week.  See Dr. Rayann Heman in 2 weeks.  Time spent with patient to include physician time:  45 minutes Signed: W. Doristine Church. MD [facility] 12/18/2017, 2:03 PM

## 2017-12-19 LAB — GLUCOSE, CAPILLARY: Glucose-Capillary: 110 mg/dL — ABNORMAL HIGH (ref 65–99)

## 2017-12-20 ENCOUNTER — Ambulatory Visit: Payer: Medicare Other | Admitting: Family Medicine

## 2017-12-20 ENCOUNTER — Encounter: Payer: Self-pay | Admitting: Family Medicine

## 2017-12-20 VITALS — BP 120/78 | HR 92 | Temp 97.3°F | Wt 253.8 lb

## 2017-12-20 DIAGNOSIS — I4819 Other persistent atrial fibrillation: Secondary | ICD-10-CM

## 2017-12-20 DIAGNOSIS — E119 Type 2 diabetes mellitus without complications: Secondary | ICD-10-CM

## 2017-12-20 DIAGNOSIS — F1020 Alcohol dependence, uncomplicated: Secondary | ICD-10-CM

## 2017-12-20 DIAGNOSIS — I481 Persistent atrial fibrillation: Secondary | ICD-10-CM

## 2017-12-20 MED ORDER — LORAZEPAM 0.5 MG PO TABS: ORAL_TABLET | ORAL | 5 refills | Status: DC

## 2017-12-20 NOTE — Progress Notes (Signed)
Subjective:     Patient ID: Adam [name], male   DOB: [birth year], 67 y.o.   MRN: [id number]  HPI Patient here today to discuss multiple issues. He was actually just recently admitted March 11 through 17th  for atrial fibrillation. He has history of CAD and was found in atrial fibrillation and started on eliquis. He was admitted for initiation of Tikosyn.  He was noted though to be hypokalemic and required multiple dose of potassium to replete. He also had borderline QT prolongation. He was eventually started on low-dose.Marland Kitchen He was taken off HCTZ and new prescription for plain losartan. Electrolytes at discharge stable.  Patient does have history of alcohol use- usually 3 mixed drinks per day. We have discussed this multiple times in the past. He is aware of potential association with A. fib and alcohol.  Type 2 diabetes. On regimen including oral medications and insulin. Recent A1c 6.7%.  No recent hypoglycemia.  He has history of what sounds like posttraumatic stress disorder related to multiple cardiac procedures and hospitalizations when his back in [place] years ago. He came to [place] on high-dose lorazepam and at one point was taking up to 8 per day and we have him currently down to 0.5 mg 3 times a day. We have discussed tapering this down and hopefully eventually off.  Past Medical History:  Diagnosis Date  . Allergy   . Anxiety    history of PTSD following CABG  . Arthritis   . Asthma   . Colitis- colonoscopy 2014 07/13/2015  . COPD (chronic obstructive pulmonary disease) ([place])   . Coronary artery disease    x 6  . Depression   . Family history of polyps in the colon   . GERD (gastroesophageal reflux disease)   . Gout   . H/O atrial fibrillation without current medication    following CABG with no documented episodes since then.  Marland Kitchen Hx of adenomatous colonic polyps 08/12/2010  . Hyperlipidemia   . Hypertension   . Prediabetes   . RLS (restless legs syndrome)   . 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 APPENDECTOMY    . COLONOSCOPY W/ BIOPSIES    . CORONARY ANGIOPLASTY WITH STENT PLACEMENT    . CORONARY ARTERY BYPASS GRAFT    . TOTAL HIP ARTHROPLASTY      reports that he quit smoking about 40 years ago. His smoking use included cigarettes. He has a 7.00 pack-year smoking history. he has never used smokeless tobacco. He reports that he drinks alcohol. He reports that he does not use drugs. family history includes Colon cancer in his mother; Heart disease in his paternal grandfather and paternal grandmother. Allergies  Allergen Reactions  . Clindamycin/Lincomycin Rash  . Keflex [Cephalexin] Rash    rash     Review of Systems  Constitutional: Positive for fatigue. Negative for unexpected weight change.  Eyes: Negative for visual disturbance.  Respiratory: Negative for cough, chest tightness and shortness of breath.   Cardiovascular: Positive for palpitations. Negative for chest pain and leg swelling.  Gastrointestinal: Negative for abdominal pain.  Endocrine: Negative for polydipsia and polyuria.  Neurological: Negative for dizziness, syncope, weakness, light-headedness and headaches.       Objective:   Physical Exam  Constitutional: He is oriented to person, place, and time. He appears well-developed and well-nourished.  HENT:  Right Ear: External ear normal.  Left Ear: External ear normal.  Mouth/Throat:  Oropharynx is clear and moist.  Eyes: Pupils are equal, round, and reactive to light.  Neck: Neck supple. No thyromegaly present.  Cardiovascular:  Irregular rhythm with rate between 90 and 100  Pulmonary/Chest: Effort normal and breath sounds normal. No respiratory distress. He has no wheezes. He has no rales.  Musculoskeletal: He exhibits no edema.  Neurological: He is alert and oriented to person, place, and time.       Assessment:     #1 type 2 diabetes with fair control of  recent A1c 6.7%  #2 recurrent atrial fibrillation with rapid ventricular response currently fair rate control and on eliquis. Pending evaluation with EP specialist to discuss possible ablation  #3 chronic alcohol use. Currently 3 drinks per day    Plan:     -Continue current diabetes regimen -We'll plan follow-up A1c in 6 months -Refilled his lorazepam and discussed issues of trying to continue tapering -Continue close follow up with cardiology regarding his atrial fibrillation  [name] MD  [facility]

## 2017-12-22 DIAGNOSIS — I481 Persistent atrial fibrillation: Secondary | ICD-10-CM | POA: Diagnosis not present

## 2017-12-22 DIAGNOSIS — I2510 Atherosclerotic heart disease of native coronary artery without angina pectoris: Secondary | ICD-10-CM | POA: Diagnosis not present

## 2017-12-22 DIAGNOSIS — I5032 Chronic diastolic (congestive) heart failure: Secondary | ICD-10-CM | POA: Diagnosis not present

## 2017-12-22 DIAGNOSIS — J45909 Unspecified asthma, uncomplicated: Secondary | ICD-10-CM | POA: Diagnosis not present

## 2017-12-23 ENCOUNTER — Encounter: Payer: Self-pay | Admitting: Internal Medicine

## 2017-12-29 DIAGNOSIS — L309 Dermatitis, unspecified: Secondary | ICD-10-CM | POA: Diagnosis not present

## 2017-12-29 DIAGNOSIS — B070 Plantar wart: Secondary | ICD-10-CM | POA: Diagnosis not present

## 2017-12-29 DIAGNOSIS — L570 Actinic keratosis: Secondary | ICD-10-CM | POA: Diagnosis not present

## 2018-01-03 ENCOUNTER — Encounter: Payer: Self-pay | Admitting: Family Medicine

## 2018-01-03 MED ORDER — GLUCOSE BLOOD VI STRP: ORAL_STRIP | 3 refills | Status: DC

## 2018-01-04 ENCOUNTER — Telehealth: Payer: Self-pay | Admitting: *Deleted

## 2018-01-04 ENCOUNTER — Ambulatory Visit: Payer: Medicare Other | Admitting: Internal Medicine

## 2018-01-04 ENCOUNTER — Encounter: Payer: Self-pay | Admitting: Internal Medicine

## 2018-01-04 VITALS — BP 132/84 | HR 99 | Ht 70.0 in | Wt 262.0 lb

## 2018-01-04 DIAGNOSIS — I481 Persistent atrial fibrillation: Secondary | ICD-10-CM

## 2018-01-04 DIAGNOSIS — R0683 Snoring: Secondary | ICD-10-CM

## 2018-01-04 DIAGNOSIS — I4819 Other persistent atrial fibrillation: Secondary | ICD-10-CM

## 2018-01-04 NOTE — Progress Notes (Signed)
PCP: [name], MD Primary Cardiologist: [name] Primary EP: [name] is a 67 y.o. male who presents today for routine electrophysiology followup.  Since last being seen in our clinic, the patient reports doing reasonably well.  He has failed medical therapy with tikosyn.  He felt "so much better--> like a king" when in sinus.  Now back to afib and feeling poor. Today, he denies symptoms of palpitations, chest pain, shortness of breath,  lower extremity edema, dizziness, presyncope, or syncope.  The patient is otherwise without complaint today.   Past Medical History:  Diagnosis Date  . Allergy   . Anxiety    history of PTSD following CABG  . Arthritis   . Asthma   . Colitis- colonoscopy 2014 07/13/2015  . COPD (chronic obstructive pulmonary disease) ([place])   . Coronary artery disease    x 6  . Depression   . Family history of polyps in the colon   . GERD (gastroesophageal reflux disease)   . Gout   . H/O atrial fibrillation without current medication    following CABG with no documented episodes since then.  Marland Kitchen Hx of adenomatous colonic polyps 08/12/2010  . Hyperlipidemia   . Hypertension   . Prediabetes   . RLS (restless legs syndrome)   . 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 APPENDECTOMY    . COLONOSCOPY W/ BIOPSIES    . CORONARY ANGIOPLASTY WITH STENT PLACEMENT    . CORONARY ARTERY BYPASS GRAFT    . TOTAL HIP ARTHROPLASTY      ROS- all systems are reviewed and negatives except as per HPI above  Current Outpatient Medications  Medication Sig Dispense Refill  . albuterol (PROAIR HFA) 108 (90 BASE) MCG/ACT inhaler Inhale 1-2 puffs into the lungs every 6 (six) hours as needed for wheezing or shortness of breath. 8 g 5  . allopurinol (ZYLOPRIM) 300 MG tablet TAKE 1 TABLET BY MOUTH EVERY DAY 90 tablet 0  . Coenzyme Q10 (CO Q-10) 100 MG CAPS Take 2 tablets by mouth daily.    Marland Kitchen  DILT-XR 120 MG 24 hr capsule Take 120 mg by mouth daily.  9  . dofetilide (TIKOSYN) 125 MCG capsule Take 1 capsule (125 mcg total) by mouth every 12 (twelve) hours. 60 capsule 2  . ELIQUIS 5 MG TABS tablet Take 5 mg by mouth 2 (two) times daily.  12  . empagliflozin (JARDIANCE) 25 MG TABS tablet Take 25 mg by mouth daily.    . fenofibrate 160 MG tablet Take 1 tablet (160 mg total) by mouth daily. 90 tablet 3  . fluticasone (FLONASE) 50 MCG/ACT nasal spray Place 1 spray into the nose daily.     . furosemide (LASIX) 20 MG tablet TAKE 1 TABLET(20 MG) BY MOUTH DAILY AS NEEDED FOR LEG SWELLING 30 tablet 0  . glucose blood (ONETOUCH VERIO) test strip Test once daily.  Dx E11.9 100 each 3  . Insulin Pen Needle (BD PEN NEEDLE NANO U/F) 32G X 4 MM MISC Use as instructed 100 each 3  . LORazepam (ATIVAN) 0.5 MG tablet Take tablet every 8 hours as needed for anxiety 90 tablet 5  . losartan (COZAAR) 100 MG tablet Take 1 tablet (100 mg total) by mouth daily. 30 tablet 12  . metFORMIN (GLUCOPHAGE) 1000 MG tablet TAKE 1 TABLET BY MOUTH TWICE DAILY WITH A MEAL 180 tablet 0  .  metoprolol (LOPRESSOR) 100 MG tablet Take 100 mg by mouth 2 (two) times daily.    . montelukast (SINGULAIR) 10 MG tablet TAKE 1 TABLET BY MOUTH AT BEDTIME 90 tablet 0  . Olopatadine HCl (PATADAY) 0.2 % SOLN Apply 1 drop to eye as needed. 2.5 mL 5  . ONETOUCH DELICA LANCETS FINE MISC 1 Device by Other route as needed. Use as instructed.  DX: 250.00 100 each 3  . pantoprazole (PROTONIX) 40 MG tablet TAKE 1 TABLET BY MOUTH TWICE DAILY 180 tablet 1  . potassium chloride SA (K-DUR,KLOR-CON) 20 MEQ tablet Take 2 tablets (40 mEq total) by mouth daily. 60 tablet 12  . pramipexole (MIRAPEX) 0.25 MG tablet Take 1 tablet (0.25 mg total) by mouth at bedtime. 90 tablet 3  . simvastatin (ZOCOR) 80 MG tablet TAKE 1 TABLET BY MOUTH EVERY DAY 90 tablet 2  . SPIRIVA HANDIHALER 18 MCG inhalation capsule INHALE CONTENTS OF 1 CAPSULE ONCE DAILY USING HANDIHALER  90 capsule 0  . TOUJEO SOLOSTAR 300 UNIT/ML SOPN INJECT 80 UNITS UNDER THE SKIN AS DIRECTED (Patient taking differently: INJECT 70 UNITS UNDER THE SKIN AS DIRECTED in the morning) 4.5 mL 5   No current facility-administered medications for this visit.     Physical Exam: Vitals:   01/04/18 1146  BP: 132/84  Pulse: 99  Weight: 118.8 kg (262 lb)  Height: 5\' 10"  (1.778 m)    GEN- The patient is well appearing, alert and oriented x 3 today.   Head- normocephalic, atraumatic Eyes-  Sclera clear, conjunctiva pink Ears- hearing intact Oropharynx- clear Lungs- Clear to ausculation bilaterally, normal work of breathing Heart- Regular rate and rhythm, no murmurs, rubs or gallops, PMI not laterally displaced GI- soft, NT, ND, + BS Extremities- no clubbing, cyanosis, or edema  EKG tracing ordered today is personally reviewed and shows afib, V rate 99 bpm  Assessment and Plan:  1. Persistent atrial fibrillation The patient has veryy symptomatic persistent afib He has failed medical therapy with tikosyn Therapeutic strategies for afib including medicine and ablation were discussed in detail with the patient today. Risk, benefits, and alternatives to EP study and radiofrequency ablation for afib were also discussed in detail today. These risks include but are not limited to stroke, bleeding, vascular damage, tamponade, perforation, damage to the esophagus, lungs, and other structures, pulmonary vein stenosis, worsening renal function, and death. The patient understands these risk and wishes to proceed.  We will therefore proceed with catheter ablation at the next available time.  Cardiac CT requested prior to ablation. Carto and Anesthesia also requested  2. Snoring Sleep study advised   [name] MD, [facility] 01/04/2018 12:06 PM

## 2018-01-04 NOTE — H&P (View-Only) (Signed)
PCP: [name], MD Primary Cardiologist: [name] Primary EP: [name] is a 67 y.o. male who presents today for routine electrophysiology followup.  Since last being seen in our clinic, the patient reports doing reasonably well.  He has failed medical therapy with tikosyn.  He felt "so much better--> like a king" when in sinus.  Now back to afib and feeling poor. Today, he denies symptoms of palpitations, chest pain, shortness of breath,  lower extremity edema, dizziness, presyncope, or syncope.  The patient is otherwise without complaint today.   Past Medical History:  Diagnosis Date  . Allergy   . Anxiety    history of PTSD following CABG  . Arthritis   . Asthma   . Colitis- colonoscopy 2014 07/13/2015  . COPD (chronic obstructive pulmonary disease) ([place])   . Coronary artery disease    x 6  . Depression   . Family history of polyps in the colon   . GERD (gastroesophageal reflux disease)   . Gout   . H/O atrial fibrillation without current medication    following CABG with no documented episodes since then.  Marland Kitchen Hx of adenomatous colonic polyps 08/12/2010  . Hyperlipidemia   . Hypertension   . Prediabetes   . RLS (restless legs syndrome)   . 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 APPENDECTOMY    . COLONOSCOPY W/ BIOPSIES    . CORONARY ANGIOPLASTY WITH STENT PLACEMENT    . CORONARY ARTERY BYPASS GRAFT    . TOTAL HIP ARTHROPLASTY      ROS- all systems are reviewed and negatives except as per HPI above  Current Outpatient Medications  Medication Sig Dispense Refill  . albuterol (PROAIR HFA) 108 (90 BASE) MCG/ACT inhaler Inhale 1-2 puffs into the lungs every 6 (six) hours as needed for wheezing or shortness of breath. 8 g 5  . allopurinol (ZYLOPRIM) 300 MG tablet TAKE 1 TABLET BY MOUTH EVERY DAY 90 tablet 0  . Coenzyme Q10 (CO Q-10) 100 MG CAPS Take 2 tablets by mouth daily.    Marland Kitchen  DILT-XR 120 MG 24 hr capsule Take 120 mg by mouth daily.  9  . dofetilide (TIKOSYN) 125 MCG capsule Take 1 capsule (125 mcg total) by mouth every 12 (twelve) hours. 60 capsule 2  . ELIQUIS 5 MG TABS tablet Take 5 mg by mouth 2 (two) times daily.  12  . empagliflozin (JARDIANCE) 25 MG TABS tablet Take 25 mg by mouth daily.    . fenofibrate 160 MG tablet Take 1 tablet (160 mg total) by mouth daily. 90 tablet 3  . fluticasone (FLONASE) 50 MCG/ACT nasal spray Place 1 spray into the nose daily.     . furosemide (LASIX) 20 MG tablet TAKE 1 TABLET(20 MG) BY MOUTH DAILY AS NEEDED FOR LEG SWELLING 30 tablet 0  . glucose blood (ONETOUCH VERIO) test strip Test once daily.  Dx E11.9 100 each 3  . Insulin Pen Needle (BD PEN NEEDLE NANO U/F) 32G X 4 MM MISC Use as instructed 100 each 3  . LORazepam (ATIVAN) 0.5 MG tablet Take tablet every 8 hours as needed for anxiety 90 tablet 5  . losartan (COZAAR) 100 MG tablet Take 1 tablet (100 mg total) by mouth daily. 30 tablet 12  . metFORMIN (GLUCOPHAGE) 1000 MG tablet TAKE 1 TABLET BY MOUTH TWICE DAILY WITH A MEAL 180 tablet 0  .  metoprolol (LOPRESSOR) 100 MG tablet Take 100 mg by mouth 2 (two) times daily.    . montelukast (SINGULAIR) 10 MG tablet TAKE 1 TABLET BY MOUTH AT BEDTIME 90 tablet 0  . Olopatadine HCl (PATADAY) 0.2 % SOLN Apply 1 drop to eye as needed. 2.5 mL 5  . ONETOUCH DELICA LANCETS FINE MISC 1 Device by Other route as needed. Use as instructed.  DX: 250.00 100 each 3  . pantoprazole (PROTONIX) 40 MG tablet TAKE 1 TABLET BY MOUTH TWICE DAILY 180 tablet 1  . potassium chloride SA (K-DUR,KLOR-CON) 20 MEQ tablet Take 2 tablets (40 mEq total) by mouth daily. 60 tablet 12  . pramipexole (MIRAPEX) 0.25 MG tablet Take 1 tablet (0.25 mg total) by mouth at bedtime. 90 tablet 3  . simvastatin (ZOCOR) 80 MG tablet TAKE 1 TABLET BY MOUTH EVERY DAY 90 tablet 2  . SPIRIVA HANDIHALER 18 MCG inhalation capsule INHALE CONTENTS OF 1 CAPSULE ONCE DAILY USING HANDIHALER  90 capsule 0  . TOUJEO SOLOSTAR 300 UNIT/ML SOPN INJECT 80 UNITS UNDER THE SKIN AS DIRECTED (Patient taking differently: INJECT 70 UNITS UNDER THE SKIN AS DIRECTED in the morning) 4.5 mL 5   No current facility-administered medications for this visit.     Physical Exam: Vitals:   01/04/18 1146  BP: 132/84  Pulse: 99  Weight: 118.8 kg (262 lb)  Height: 5\' 10"  (1.778 m)    GEN- The patient is well appearing, alert and oriented x 3 today.   Head- normocephalic, atraumatic Eyes-  Sclera clear, conjunctiva pink Ears- hearing intact Oropharynx- clear Lungs- Clear to ausculation bilaterally, normal work of breathing Heart- Regular rate and rhythm, no murmurs, rubs or gallops, PMI not laterally displaced GI- soft, NT, ND, + BS Extremities- no clubbing, cyanosis, or edema  EKG tracing ordered today is personally reviewed and shows afib, V rate 99 bpm  Assessment and Plan:  1. Persistent atrial fibrillation The patient has veryy symptomatic persistent afib He has failed medical therapy with tikosyn Therapeutic strategies for afib including medicine and ablation were discussed in detail with the patient today. Risk, benefits, and alternatives to EP study and radiofrequency ablation for afib were also discussed in detail today. These risks include but are not limited to stroke, bleeding, vascular damage, tamponade, perforation, damage to the esophagus, lungs, and other structures, pulmonary vein stenosis, worsening renal function, and death. The patient understands these risk and wishes to proceed.  We will therefore proceed with catheter ablation at the next available time.  Cardiac CT requested prior to ablation. Carto and Anesthesia also requested  2. Snoring Sleep study advised   [name] MD, [facility] 01/04/2018 12:06 PM

## 2018-01-04 NOTE — Telephone Encounter (Signed)
-----   Message from Damian Leavell, RN sent at 01/04/2018 12:50 PM EDT ----- Regarding: cardiac CT prior to Afib ablation and referral for sleep study Pt has been scheduled for an afib ablation on January 31, 2018 @ 9:30 am for afib with Dr. Rayann Heman.  Pt will need cardiac CT prior to ablation.  Pt also referred for sleep study for snoring, ESS score of 10.  Thank you all, Bing Neighbors

## 2018-01-04 NOTE — Telephone Encounter (Signed)
Sleep study sent to precert. 

## 2018-01-04 NOTE — Patient Instructions (Addendum)
Medication Instructions:  Your physician recommends that you continue on your current medications as directed. Please refer to the Current Medication list given to you today. If you need a refill on your cardiac medications before your next appointment, please call your pharmacy.  Labwork: You will get lab work within 14 days of your procedure:  BMP and CBC.  Please schedule lab work within 14 days of January 31, 2018  Testing/Procedures:  Your physician has requested that you have cardiac CT. Cardiac computed tomography (CT) is a painless test that uses an x-ray machine to take clear, detailed pictures of your heart. For further information please visit [email or web address]. Please follow instruction sheet as given.-You will get a call from our office to schedule the date for this test.  Your physician has recommended that you have an ablation. Catheter ablation is a medical procedure used to treat some cardiac arrhythmias (irregular heartbeats). During catheter ablation, a long, thin, flexible tube is put into a blood vessel in your groin (upper thigh), or neck. This tube is called an ablation catheter. It is then guided to your heart through the blood vessel. Radio frequency waves destroy small areas of heart tissue where abnormal heartbeats may cause an arrhythmia to start. Please see the instruction sheet given to you today.  Follow-Up: You will follow up with Roderic Palau, NP with the Atrial fibrillation (Afib) clinic 4 weeks after your ablation.  You will follow up with Dr. Rayann Heman 3 months after your procedure.  CARDIAC CT INSTRUCTIONS:  Please arrive at the [facility] main entrance of [facility] [place], [place] [address] [phone]  Proceed to the [facility] Radiology Department (First Floor).  Please follow these instructions carefully (unless otherwise directed):  Hold all erectile dysfunction medications at least 48 hours  prior to test.  On the Night Before the Test: . Drink plenty of water. . Do not consume any caffeinated/decaffeinated beverages or chocolate 12 hours prior to your test. . Do not take any antihistamines 12 hours prior to your test. . If you take Metformin do not take 24 hours prior to test. On the Day of the Test: . Drink plenty of water. Do not drink any water within one hour of the test. . Do not eat any food 4 hours prior to the test. . You may take your regular medications prior to the test. . IF NOT ON A BETA BLOCKER - Take 50 mg of lopressor (metoprolol) one hour before the test.  You are on a beta blocker.  Take your nebivolol prior to this procedure. Marland Kitchen HOLD Furosemide morning of the test.  After the Test: . Drink plenty of water. . After receiving IV contrast, you may experience a mild flushed feeling. This is normal. . On occasion, you may experience a mild rash up to 24 hours after the test. This is not dangerous. If this occurs, you can take Benadryl 25 mg and increase your fluid intake. . If you experience trouble breathing, this can be serious. If it is severe call 911 IMMEDIATELY. If it is mild, please call our office. . If you take any of these medications: METFORMIN, please do not take 48 hours after completing test.  ABLATION INSTRUCTIONS:  Please arrive at the [facility] main entrance of [facility] hospital at: 7:30 am on January 31, 2018 Do not eat or drink after midnight prior to procedure On the morning of your procedure do not take any medications  EXCEPT for your [place] for one night stay.  You will need someone to drive you home at discharge.   Cardiac Ablation Cardiac ablation is a procedure to disable (ablate) a small amount of heart tissue in very specific places. The heart has many electrical connections. Sometimes these connections are abnormal and can cause the heart to beat very fast or irregularly. Ablating some of the problem areas can improve the  heart rhythm or return it to normal. Ablation may be done for people who:  Have Wolff-Parkinson-White syndrome.  Have fast heart rhythms (tachycardia).  Have taken medicines for an abnormal heart rhythm (arrhythmia) that were not effective or caused side effects.  Have a high-risk heartbeat that may be life-threatening.  During the procedure, a small incision is made in the neck or the groin, and a long, thin, flexible tube (catheter) is inserted into the incision and moved to the heart. Small devices (electrodes) on the tip of the catheter will send out electrical currents. A type of X-ray (fluoroscopy) will be used to help guide the catheter and to provide images of the heart. Tell a health care provider about:  Any allergies you have.  All medicines you are taking, including vitamins, herbs, eye drops, creams, and over-the-counter medicines.  Any problems you or family members have had with anesthetic medicines.  Any blood disorders you have.  Any surgeries you have had.  Any medical conditions you have, such as kidney failure.  Whether you are pregnant or may be pregnant. What are the risks? Generally, this is a safe procedure. However, problems may occur, including:  Infection.  Bruising and bleeding at the catheter insertion site.  Bleeding into the chest, especially into the sac that surrounds the heart. This is a serious complication.  Stroke or blood clots.  Damage to other structures or organs.  Allergic reaction to medicines or dyes.  Need for a permanent pacemaker if the normal electrical system is damaged. A pacemaker is a small computer that sends electrical signals to the heart and helps your heart beat normally.  The procedure not being fully effective. This may not be recognized until months later. Repeat ablation procedures are sometimes required.  What happens before the procedure?  Follow instructions from your health care provider about eating or  drinking restrictions.  Ask your health care provider about: ? Changing or stopping your regular medicines. This is especially important if you are taking diabetes medicines or blood thinners. ? Taking medicines such as aspirin and ibuprofen. These medicines can thin your blood. Do not take these medicines before your procedure if your health care provider instructs you not to.  Plan to have someone take you home from the hospital or clinic.  If you will be going home right after the procedure, plan to have someone with you for 24 hours. What happens during the procedure?  To lower your risk of infection: ? Your health care team will wash or sanitize their hands. ? Your skin will be washed with soap. ? Hair may be removed from the incision area.  An IV tube will be inserted into one of your veins.  You will be given a medicine to help you relax (sedative).  The skin on your neck or groin will be numbed.  An incision will be made in your neck or your groin.  A needle will be inserted through the incision and into a large vein in your neck or groin.  A catheter will be inserted into  the needle and moved to your heart.  Dye may be injected through the catheter to help your surgeon see the area of the heart that needs treatment.  Electrical currents will be sent from the catheter to ablate heart tissue in desired areas. There are three types of energy that may be used to ablate heart tissue: ? Heat (radiofrequency energy). ? Laser energy. ? Extreme cold (cryoablation).  When the necessary tissue has been ablated, the catheter will be removed.  Pressure will be held on the catheter insertion area to prevent excessive bleeding.  A bandage (dressing) will be placed over the catheter insertion area. The procedure may vary among health care providers and hospitals. What happens after the procedure?  Your blood pressure, heart rate, breathing rate, and blood oxygen level will be  monitored until the medicines you were given have worn off.  Your catheter insertion area will be monitored for bleeding. You will need to lie still for a few hours to ensure that you do not bleed from the catheter insertion area.  Do not drive for 24 hours or as long as directed by your health care provider. Summary  Cardiac ablation is a procedure to disable (ablate) a small amount of heart tissue in very specific places. Ablating some of the problem areas can improve the heart rhythm or return it to normal.  During the procedure, electrical currents will be sent from the catheter to ablate heart tissue in desired areas. This information is not intended to replace advice given to you by your health care provider. Make sure you discuss any questions you have with your health care provider. Document Released: 02/06/2009 Document Revised: 08/09/2016 Document Reviewed: 08/09/2016 Elsevier Interactive Patient Education  [employer].

## 2018-01-05 ENCOUNTER — Encounter: Payer: Self-pay | Admitting: Family Medicine

## 2018-01-06 MED ORDER — GLUCOSE BLOOD VI STRP: ORAL_STRIP | 3 refills | Status: DC

## 2018-01-06 NOTE — Addendum Note (Signed)
Addended by: Miles Costain T on: 01/06/2018 09:55 AM   Modules accepted: Orders

## 2018-01-08 DIAGNOSIS — E119 Type 2 diabetes mellitus without complications: Secondary | ICD-10-CM | POA: Diagnosis not present

## 2018-01-09 ENCOUNTER — Ambulatory Visit: Payer: Medicare Other | Admitting: Emergency Medicine

## 2018-01-09 ENCOUNTER — Encounter: Payer: Self-pay | Admitting: Emergency Medicine

## 2018-01-09 DIAGNOSIS — J441 Chronic obstructive pulmonary disease with (acute) exacerbation: Secondary | ICD-10-CM | POA: Diagnosis not present

## 2018-01-09 DIAGNOSIS — G4730 Sleep apnea, unspecified: Secondary | ICD-10-CM | POA: Diagnosis not present

## 2018-01-09 NOTE — Patient Instructions (Addendum)
Please continue Spiriva as you have been taking it. Please restart Breo 1 inhalation once a day.  Remember to rinse and gargle after using this medicine. Keep albuterol available to use 2 puffs up to every 4 hours as needed for shortness of breath, wheezing, chest tightness. Agree with getting your sleep study done.  Do suspect that you have obstructive sleep apnea.  Based on the results we will determine what therapy would be best. Follow with Dr. Rayann Heman for your ablation as planned. Follow with Dr Lamonte Sakai next available to review your sleep study together.

## 2018-01-09 NOTE — Assessment & Plan Note (Signed)
Suspected OSA.  Agree with plans for a sleep study in the near future.  I will review with him was performed and we will plan for CPAP.  Better control this will help with all of his other existing problems.

## 2018-01-09 NOTE — Assessment & Plan Note (Signed)
His Brio was stopped during his recent hospitalization.  He was left on Spiriva.  Unsure why this was done but consider the possible tachycardia from the beta agonist.  He has definitely missed the Brio and we will restart it now.  I think that the risk of poorly controlled COPD is higher than any side effect from the [facility].  Continue his Spiriva.

## 2018-01-09 NOTE — Progress Notes (Signed)
   Subjective:    Patient ID: Adam [name], male    DOB: 1951-09-26, 67 y.o.   MRN: [id number]  HPI  Acute OV 09/08/17 --67 year old gentleman with COPD with asthmatic features, chronic cough, chronic rhinitis.  Also with a history of hypertension, CAD/CABG, restless leg syndrome. He developed URI sx about 10 days ago. He has clear nasal drainage, cough that may be lightening up some, prod of green/yellow. He has had some associated dyspnea.  Flu shot is up-to-date.  He has benefited from over-the-counter decongestants.  The biggest change is been the color of his mucus  ROV 01/09/18 --patient follows up today for his COPD with a positive bronchodilator response, chronic rhinitis and associated chronic cough.  He also has obesity, hypertension With diastolic dysfunction, CAD/CABG, restless leg syndrome.  He was admitted at the beginning of the month with persistent atrial fibrillation. His Breo was stopped at that time. ? Why, maybe to decrease tachycardia. He misses it - more dyspnea, more cough, more phlegm.    No flowsheet data found.  Review of Systems As per hpi      Objective:   Physical Exam Vitals:   01/09/18 1112 01/09/18 1113  BP:  134/84  Pulse:  (!) 114  SpO2:  94%  Weight: 267 lb (121.1 kg)   Height: 5\' 10"  (1.778 m)    Gen: Pleasant, overwt man, in no distress,  normal affect  ENT: No lesions,  mouth clear,  oropharynx clear, no nasal congestion  Neck: No JVD, no stridor  Lungs: No use of accessory muscles, clear without rales or rhonchi, no wheezing today  Cardiovascular: RRR, heart sounds normal, no murmur or gallops, trace pretibial peripheral edema  Musculoskeletal: No deformities, no cyanosis or clubbing  Neuro: alert, non focal  Skin: Warm, no lesions or rashes      Assessment & Plan:  COPD (chronic obstructive pulmonary disease) His Brio was stopped during his recent hospitalization.  He was left on Spiriva.  Unsure why this was done but consider  the possible tachycardia from the beta agonist.  He has definitely missed the Brio and we will restart it now.  I think that the risk of poorly controlled COPD is higher than any side effect from the [facility].  Continue his Spiriva.  Sleep apnea Suspected OSA.  Agree with plans for a sleep study in the near future.  I will review with him was performed and we will plan for CPAP.  Better control this will help with all of his other existing problems.  Baltazar Apo, MD, PhD 01/09/2018, 11:30 AM [facility] [phone] or if no answer [phone]

## 2018-01-15 ENCOUNTER — Other Ambulatory Visit: Payer: Self-pay | Admitting: Family Medicine

## 2018-01-19 ENCOUNTER — Other Ambulatory Visit: Payer: Medicare Other

## 2018-01-19 DIAGNOSIS — I481 Persistent atrial fibrillation: Secondary | ICD-10-CM | POA: Diagnosis not present

## 2018-01-19 DIAGNOSIS — R0683 Snoring: Secondary | ICD-10-CM | POA: Diagnosis not present

## 2018-01-19 DIAGNOSIS — I4819 Other persistent atrial fibrillation: Secondary | ICD-10-CM

## 2018-01-19 NOTE — Telephone Encounter (Signed)
Patient is scheduled for lab study on 02/11/18. Patient understands his sleep study will be done at [facility] sleep lab. Patient understands he will receive a sleep packet in a week or so. Patient understands to call if he does not receive the sleep packet in a timely manner.  Left detailed message on voicemail with date and time of titration and informed patient to call back to confirm or reschedule.

## 2018-01-20 LAB — CBC WITH DIFFERENTIAL/PLATELET
BASOS: 0 %
Basophils Absolute: 0 10*3/uL (ref 0.0–0.2)
EOS (ABSOLUTE): 0.1 10*3/uL (ref 0.0–0.4)
Eos: 1 %
Hematocrit: 41.6 % (ref 37.5–51.0)
Hemoglobin: 14.8 g/dL (ref 13.0–17.7)
IMMATURE GRANULOCYTES: 1 %
Immature Grans (Abs): 0.1 10*3/uL (ref 0.0–0.1)
LYMPHS ABS: 1.5 10*3/uL (ref 0.7–3.1)
Lymphs: 20 %
MCH: 34.3 pg — ABNORMAL HIGH (ref 26.6–33.0)
MCHC: 35.6 g/dL (ref 31.5–35.7)
MCV: 96 fL (ref 79–97)
MONOCYTES: 19 %
Monocytes Absolute: 1.4 10*3/uL — ABNORMAL HIGH (ref 0.1–0.9)
NEUTROS PCT: 59 %
Neutrophils Absolute: 4.4 10*3/uL (ref 1.4–7.0)
PLATELETS: 170 10*3/uL (ref 150–379)
RBC: 4.32 x10E6/uL (ref 4.14–5.80)
RDW: 14 % (ref 12.3–15.4)
WBC: 7.6 10*3/uL (ref 3.4–10.8)

## 2018-01-20 LAB — BASIC METABOLIC PANEL
BUN / CREAT RATIO: 10 (ref 10–24)
BUN: 9 mg/dL (ref 8–27)
CALCIUM: 9.2 mg/dL (ref 8.6–10.2)
CO2: 20 mmol/L (ref 20–29)
Chloride: 100 mmol/L (ref 96–106)
Creatinine, Ser: 0.88 mg/dL (ref 0.76–1.27)
GFR calc Af Amer: 103 mL/min/{1.73_m2} (ref >59)
GFR calc non Af Amer: 89 mL/min/{1.73_m2} (ref >59)
GLUCOSE: 120 mg/dL — AB (ref 65–99)
Potassium: 4.4 mmol/L (ref 3.5–5.2)
SODIUM: 138 mmol/L (ref 134–144)

## 2018-01-26 ENCOUNTER — Other Ambulatory Visit: Payer: Self-pay | Admitting: *Deleted

## 2018-01-26 MED ORDER — GLUCOSE BLOOD VI STRP: ORAL_STRIP | 3 refills | Status: DC

## 2018-01-27 ENCOUNTER — Ambulatory Visit (HOSPITAL_COMMUNITY)
Admission: RE | Admit: 2018-01-27 | Discharge: 2018-01-27 | Disposition: A | Discharge status: Home / Self Care | Payer: Medicare Other | Source: Ambulatory Visit | Attending: Internal Medicine | Admitting: Internal Medicine

## 2018-01-27 ENCOUNTER — Ambulatory Visit (HOSPITAL_COMMUNITY): Admission: RE | Admit: 2018-01-27 | Payer: Medicare Other | Source: Ambulatory Visit

## 2018-01-27 DIAGNOSIS — I481 Persistent atrial fibrillation: Secondary | ICD-10-CM | POA: Insufficient documentation

## 2018-01-27 DIAGNOSIS — I712 Thoracic aortic aneurysm, without rupture: Secondary | ICD-10-CM | POA: Diagnosis not present

## 2018-01-27 DIAGNOSIS — I4819 Other persistent atrial fibrillation: Secondary | ICD-10-CM

## 2018-01-27 DIAGNOSIS — I4891 Unspecified atrial fibrillation: Secondary | ICD-10-CM | POA: Diagnosis not present

## 2018-01-27 MED ORDER — IOPAMIDOL (ISOVUE-370) INJECTION 76%
100.0000 mL | Freq: Once | INTRAVENOUS | Status: AC | PRN
  Administered 2018-01-27: 80 mL via INTRAVENOUS

## 2018-01-27 MED ORDER — IOPAMIDOL (ISOVUE-370) INJECTION 76%
INTRAVENOUS | Status: AC
  Filled 2018-01-27: qty 100

## 2018-01-31 ENCOUNTER — Encounter: Payer: Self-pay | Admitting: Internal Medicine

## 2018-01-31 ENCOUNTER — Ambulatory Visit (HOSPITAL_COMMUNITY)
Admission: RE | Admit: 2018-01-31 | Discharge: 2018-01-31 | Disposition: A | Discharge status: Home / Self Care | Payer: Medicare Other | Source: Ambulatory Visit | Attending: Internal Medicine | Admitting: Internal Medicine

## 2018-01-31 ENCOUNTER — Telehealth: Payer: Self-pay

## 2018-01-31 ENCOUNTER — Encounter (HOSPITAL_COMMUNITY)
Admission: RE | Disposition: A | Discharge status: Home / Self Care | Payer: Self-pay | Source: Ambulatory Visit | Attending: Internal Medicine

## 2018-01-31 ENCOUNTER — Encounter (HOSPITAL_COMMUNITY): Payer: Self-pay | Admitting: Certified Registered Nurse Anesthetist

## 2018-01-31 DIAGNOSIS — I7121 Aneurysm of the ascending aorta, without rupture: Secondary | ICD-10-CM

## 2018-01-31 DIAGNOSIS — F4310 Post-traumatic stress disorder, unspecified: Secondary | ICD-10-CM | POA: Diagnosis not present

## 2018-01-31 DIAGNOSIS — J449 Chronic obstructive pulmonary disease, unspecified: Secondary | ICD-10-CM | POA: Insufficient documentation

## 2018-01-31 DIAGNOSIS — I481 Persistent atrial fibrillation: Secondary | ICD-10-CM | POA: Insufficient documentation

## 2018-01-31 DIAGNOSIS — F329 Major depressive disorder, single episode, unspecified: Secondary | ICD-10-CM | POA: Insufficient documentation

## 2018-01-31 DIAGNOSIS — Z5309 Procedure and treatment not carried out because of other contraindication: Secondary | ICD-10-CM | POA: Insufficient documentation

## 2018-01-31 DIAGNOSIS — Z7951 Long term (current) use of inhaled steroids: Secondary | ICD-10-CM | POA: Insufficient documentation

## 2018-01-31 DIAGNOSIS — K219 Gastro-esophageal reflux disease without esophagitis: Secondary | ICD-10-CM | POA: Insufficient documentation

## 2018-01-31 DIAGNOSIS — M109 Gout, unspecified: Secondary | ICD-10-CM | POA: Insufficient documentation

## 2018-01-31 DIAGNOSIS — I10 Essential (primary) hypertension: Secondary | ICD-10-CM | POA: Insufficient documentation

## 2018-01-31 DIAGNOSIS — Z7901 Long term (current) use of anticoagulants: Secondary | ICD-10-CM | POA: Insufficient documentation

## 2018-01-31 DIAGNOSIS — E785 Hyperlipidemia, unspecified: Secondary | ICD-10-CM | POA: Diagnosis not present

## 2018-01-31 DIAGNOSIS — I4819 Other persistent atrial fibrillation: Secondary | ICD-10-CM

## 2018-01-31 DIAGNOSIS — Z951 Presence of aortocoronary bypass graft: Secondary | ICD-10-CM | POA: Insufficient documentation

## 2018-01-31 DIAGNOSIS — I2510 Atherosclerotic heart disease of native coronary artery without angina pectoris: Secondary | ICD-10-CM | POA: Diagnosis not present

## 2018-01-31 DIAGNOSIS — G2581 Restless legs syndrome: Secondary | ICD-10-CM | POA: Insufficient documentation

## 2018-01-31 DIAGNOSIS — M1990 Unspecified osteoarthritis, unspecified site: Secondary | ICD-10-CM | POA: Insufficient documentation

## 2018-01-31 DIAGNOSIS — I712 Thoracic aortic aneurysm, without rupture: Secondary | ICD-10-CM

## 2018-01-31 DIAGNOSIS — Z794 Long term (current) use of insulin: Secondary | ICD-10-CM | POA: Insufficient documentation

## 2018-01-31 DIAGNOSIS — R7303 Prediabetes: Secondary | ICD-10-CM | POA: Insufficient documentation

## 2018-01-31 DIAGNOSIS — Z955 Presence of coronary angioplasty implant and graft: Secondary | ICD-10-CM | POA: Insufficient documentation

## 2018-01-31 HISTORY — DX: Thoracic aortic aneurysm, without rupture: I71.2

## 2018-01-31 HISTORY — DX: Aneurysm of the ascending aorta, without rupture: I71.21

## 2018-01-31 SURGERY — ATRIAL FIBRILLATION ABLATION
Anesthesia: Monitor Anesthesia Care

## 2018-01-31 MED ORDER — HEPARIN SODIUM (PORCINE) 1000 UNIT/ML IJ SOLN
INTRAMUSCULAR | Status: AC
  Filled 2018-01-31: qty 1

## 2018-01-31 MED ORDER — DABIGATRAN ETEXILATE MESYLATE 150 MG PO CAPS: 150.0000 mg | ORAL_CAPSULE | Freq: Two times a day (BID) | ORAL | 2 refills | Status: DC

## 2018-01-31 MED ORDER — SODIUM CHLORIDE 0.9 % IV SOLN
INTRAVENOUS | Status: DC
  Administered 2018-01-31: 09:00:00 via INTRAVENOUS

## 2018-01-31 MED ORDER — IOPAMIDOL (ISOVUE-370) INJECTION 76%
INTRAVENOUS | Status: AC
  Filled 2018-01-31: qty 50

## 2018-01-31 MED ORDER — BUPIVACAINE HCL (PF) 0.25 % IJ SOLN
INTRAMUSCULAR | Status: AC
  Filled 2018-01-31: qty 30

## 2018-01-31 NOTE — Anesthesia Preprocedure Evaluation (Deleted)
Anesthesia Evaluation  Patient identified by MRN, date of birth, ID band Patient awake    Reviewed: Allergy & Precautions, NPO status , Patient's Chart, lab work & pertinent test results  Airway Mallampati: II  TM Distance: >3 FB Neck ROM: Full    Dental no notable dental hx.    Pulmonary asthma , COPD, former smoker,    Pulmonary exam normal breath sounds clear to auscultation       Cardiovascular Exercise Tolerance: Good hypertension, Pt. on home beta blockers + CAD  Normal cardiovascular exam+ dysrhythmias Atrial Fibrillation  Rhythm:Regular Rate:Normal  Echo 10/2017  The   estimated ejection fraction was in the range of 60% to 65%. Wall   motion was normal; there were no regional wall motion   abnormalities.   Neuro/Psych negative neurological ROS     GI/Hepatic Neg liver ROS, GERD  ,  Endo/Other  diabetes, Type 2Morbid obesity  Renal/GU negative Renal ROS     Musculoskeletal   Abdominal (+) + obese,   Peds  Hematology negative hematology ROS (+)   Anesthesia Other Findings   Reproductive/Obstetrics                            Anesthesia Physical Anesthesia Plan  ASA: III  Anesthesia Plan: General   Post-op Pain Management:    Induction: Intravenous  PONV Risk Score and Plan: Treatment may vary due to age or medical condition and Ondansetron  Airway Management Planned: Mask, Natural Airway and Nasal Cannula  Additional Equipment:   Intra-op Plan:   Post-operative Plan: Extubation in OR  Informed Consent: I have reviewed the patients History and Physical, chart, labs and discussed the procedure including the risks, benefits and alternatives for the proposed anesthesia with the patient or authorized representative who has indicated his/her understanding and acceptance.   Dental advisory given  Plan Discussed with: CRNA, Anesthesiologist and Surgeon  Anesthesia Plan  Comments:       Anesthesia Quick Evaluation

## 2018-01-31 NOTE — Interval H&P Note (Signed)
History and Physical Interval Note:  01/31/2018 9:30 AM  Adam [name]  has presented today for surgery, with the diagnosis of afib  The various methods of treatment have been discussed with the patient and family. After consideration of risks, benefits and other options for treatment, the patient has consented to  Procedure(s): ATRIAL FIBRILLATION ABLATION (N/A) as a surgical intervention .  The patient's history has been reviewed, patient examined, no change in status, stable for surgery.  I have reviewed the patient's chart and labs.  Questions were answered to the patient's satisfaction.    Pt reports compliance with eliquis without interruption.  Cardiac CT reviewed with him today.  [name]

## 2018-01-31 NOTE — Telephone Encounter (Signed)
CVM left per DPR. Requested call back to see if Pt would like to schedule repeat ablation end of June.  Left days available. Notified will need TEE day prior to ablation. Requested call back to schedule day or to notify this nurse when he will call. This nurse name and # left for call back.

## 2018-01-31 NOTE — Telephone Encounter (Signed)
Call back received from Pt. Pt rescheduled for ablation March 31, 2018 @ 11:30. Pt scheduled for TEE March 30, 2018 @ 11:00 am. Pt will come in for labs/instruction March 28, 2018. Will follow up.

## 2018-01-31 NOTE — Progress Notes (Signed)
Though he reports compliance with eliquis, further review of his cardiac CT by Dr Meda Coffee this am reveals filling defect in the left atrial appendage.  Though this may be due to slow flow in the appendage from afib, cannot exclude thrombus.   I had a long discussion with patient and his wife.  As he reports compliance with eliquis without interruption, I will switch him to pradaxa 150mg  BID.  He will return in 4-5 weeks for afib ablation with TEE prior to the procedure.  He reports that he has a fishing trip 5/15-6/15/19 and would like to wait until he returns to proceed.  [name] MD, [facility] 01/31/2018 9:59 AM

## 2018-01-31 NOTE — Telephone Encounter (Signed)
-----   Message from Adam Berthold, NP sent at 01/31/2018  9:56 AM EDT ----- Clot on CT scan -> we cancelled AF ablation today. Switching to Pradaxa today. He is going to [place] on a fishing trip.  Can you call him and reschedule AF ablation for end of June?  Thank you!

## 2018-02-03 ENCOUNTER — Other Ambulatory Visit: Payer: Self-pay | Admitting: Family Medicine

## 2018-02-03 DIAGNOSIS — I2510 Atherosclerotic heart disease of native coronary artery without angina pectoris: Secondary | ICD-10-CM | POA: Diagnosis not present

## 2018-02-03 DIAGNOSIS — J45909 Unspecified asthma, uncomplicated: Secondary | ICD-10-CM | POA: Diagnosis not present

## 2018-02-03 DIAGNOSIS — I5032 Chronic diastolic (congestive) heart failure: Secondary | ICD-10-CM | POA: Diagnosis not present

## 2018-02-03 DIAGNOSIS — I481 Persistent atrial fibrillation: Secondary | ICD-10-CM | POA: Diagnosis not present

## 2018-02-06 NOTE — Telephone Encounter (Signed)
Work up complete.  No further action at this time.

## 2018-02-11 ENCOUNTER — Ambulatory Visit (HOSPITAL_BASED_OUTPATIENT_CLINIC_OR_DEPARTMENT_OTHER): Payer: Medicare Other | Attending: Internal Medicine | Admitting: Cardiology

## 2018-02-11 DIAGNOSIS — I4891 Unspecified atrial fibrillation: Secondary | ICD-10-CM | POA: Diagnosis not present

## 2018-02-11 DIAGNOSIS — I493 Ventricular premature depolarization: Secondary | ICD-10-CM | POA: Diagnosis not present

## 2018-02-11 DIAGNOSIS — R0683 Snoring: Secondary | ICD-10-CM | POA: Insufficient documentation

## 2018-02-13 ENCOUNTER — Telehealth: Payer: Self-pay | Admitting: *Deleted

## 2018-02-13 NOTE — Telephone Encounter (Signed)
Patient is going to [place] until 03/18/18.  He would like an early refill of  LORazepam (ATIVAN) 0.5 MG tablet  Okay for early refill?

## 2018-02-14 ENCOUNTER — Telehealth: Payer: Self-pay | Admitting: Family Medicine

## 2018-02-14 NOTE — Telephone Encounter (Signed)
Refill OK- since going out of country.

## 2018-02-14 NOTE — Telephone Encounter (Signed)
Spoke with pharmacy

## 2018-02-14 NOTE — Procedures (Signed)
     Patient Name: Adam [name], Adam [name] Date:09/20/2017 02/11/2018 Gender: Male D.O.B: December 03, 1950 Age (years): 25 Referring Provider: [name] Height (inches): 61 Interpreting Physician: Fransico Him MD, ABSM Weight (lbs): 260 RPSGT: Earney Hamburg BMI: 37 MRN: [id number] Neck Size: 18.50  CLINICAL INFORMATION Sleep Study Type: NPSG  Indication for sleep study: Snoring  SLEEP STUDY TECHNIQUE As per the AASM Manual for the Scoring of Sleep and Associated Events v2.3 (April 2016) with a hypopnea requiring 4% desaturations.  The channels recorded and monitored were frontal, central and occipital EEG, electrooculogram (EOG), submentalis EMG (chin), nasal and oral airflow, thoracic and abdominal wall motion, anterior tibialis EMG, snore microphone, electrocardiogram, and pulse oximetry.  MEDICATIONS Medications self-administered by patient taken the night of the study : N/A  SLEEP ARCHITECTURE The study was initiated at 10:22:07 PM and ended at 4:31:43 AM.  Sleep onset time was 9.8 minutes and the sleep efficiency was 33.1%%. The total sleep time was 122.5 minutes.  Stage REM latency was 78.5 minutes.  The patient spent 11.4%% of the night in stage N1 sleep, 67.8%% in stage N2 sleep, 0.0%% in stage N3 and 20.82% in REM.  Alpha intrusion was absent.  Supine sleep was 17.14%.  RESPIRATORY PARAMETERS The overall apnea/hypopnea index (AHI) was 2.9 per hour. There were 5 total apneas, including 1 obstructive, 4 central and 0 mixed apneas. There were 1 hypopneas and 9 RERAs.  The AHI during Stage REM sleep was 0.0 per hour.  AHI while supine was 0.0 per hour.  The mean oxygen saturation was 93.1%. The minimum SpO2 during sleep was 90.0%.  moderate snoring was noted during this study.  CARDIAC DATA The 2 lead EKG demonstrated atrial fibrillation. The mean heart rate was 81.4 beats per minute. Other EKG findings include: PVCs  LEG MOVEMENT DATA The total PLMS were  0 with a resulting PLMS index of 0.0. Associated arousal with leg movement index was 2.0 .  IMPRESSIONS - No significant obstructive sleep apnea occurred during this study (AHI = 2.9/h). - No significant central sleep apnea occurred during this study (CAI = 2.0/h). - The patient had minimal or no oxygen desaturation during the study (Min O2 = 90.0%) - The patient snored with moderate snoring volume. - EKG findings include Atrial Fibrillation and PVCs. - Clinically significant periodic limb movements did not occur during sleep. No significant associated arousals.  DIAGNOSIS - Normal Study  RECOMMENDATIONS - Consider home sleep study, as patient only slept 3 hours during the polysomnogram. - Avoid alcohol, sedatives and other CNS depressants that may worsen sleep apnea and disrupt normal sleep architecture. - Sleep hygiene should be reviewed to assess factors that may improve sleep quality. - Weight management and regular exercise should be initiated or continued if appropriate.  [Electronically signed] 02/14/2018 07:16 PM  Fransico Him MD, ABSM Diplomate, American Board of Sleep Medicine

## 2018-02-14 NOTE — Telephone Encounter (Signed)
Copied from [place] #[id number]. Topic: Quick Communication - Rx Refill/Question >> Feb 14, 2018 12:26 PM Selinda Flavin B, NT wrote: Medication: LORazepam (ATIVAN) 0.5 MG tablet Has the patient contacted their pharmacy? Yes.   (Agent: If no, request that the patient contact the pharmacy for the refill.) Preferred Pharmacy (with phone number or street name): [id number] - [place], [place] - [address] Agent: Please be advised that RX refills may take up to 3 business days. We ask that you follow-up with your pharmacy.  Pharmacy is trying to get an early refill due to the patient going out of town to [place] for a month at the end of the week. States that she contacted the office via fax yesterday and has not heard anything yet. CB#: [phone]

## 2018-02-16 ENCOUNTER — Telehealth: Payer: Self-pay | Admitting: *Deleted

## 2018-02-16 DIAGNOSIS — K2100 Gastro-esophageal reflux disease with esophagitis, without bleeding: Secondary | ICD-10-CM

## 2018-02-16 DIAGNOSIS — R0683 Snoring: Secondary | ICD-10-CM

## 2018-02-16 NOTE — Telephone Encounter (Signed)
Called sleep results LMTCB 

## 2018-02-16 NOTE — Telephone Encounter (Signed)
-----   Message from Sueanne Margarita, MD sent at 02/14/2018  7:20 PM EDT ----- Please let patient know that sleep study did not show any sleep apnea but he only slept 3 hours.  Please get a home sleep study to evaluate further.

## 2018-02-20 NOTE — Telephone Encounter (Signed)
Called results LMTCB 

## 2018-02-28 ENCOUNTER — Telehealth: Payer: Self-pay | Admitting: *Deleted

## 2018-02-28 DIAGNOSIS — I2510 Atherosclerotic heart disease of native coronary artery without angina pectoris: Secondary | ICD-10-CM

## 2018-02-28 DIAGNOSIS — K219 Gastro-esophageal reflux disease without esophagitis: Secondary | ICD-10-CM

## 2018-02-28 NOTE — Telephone Encounter (Signed)
-----   Message from Sueanne Margarita, MD sent at 02/14/2018  7:20 PM EDT ----- Please let patient know that sleep study did not show any sleep apnea but he only slept 3 hours.  Please get a home sleep study to evaluate further.

## 2018-02-28 NOTE — Telephone Encounter (Signed)
Called results LMTCB 

## 2018-03-02 ENCOUNTER — Encounter: Payer: Self-pay | Admitting: *Deleted

## 2018-03-02 NOTE — Telephone Encounter (Signed)
Called results LMTCB 

## 2018-03-10 ENCOUNTER — Other Ambulatory Visit: Payer: Self-pay | Admitting: Family Medicine

## 2018-03-14 NOTE — Telephone Encounter (Addendum)
Informed patient of sleep study results and patient understanding was verbalized. Patient understands his sleep study showed he  did not show any sleep apnea but he only slept 3 hours.  Patient understands Dr Radford Pax recommends he get a home sleep study to evaluate further.  Pt is aware and agreeable to treatment.

## 2018-03-14 NOTE — Telephone Encounter (Signed)
HST sent to sleep pool. 

## 2018-03-17 ENCOUNTER — Telehealth: Payer: Self-pay | Admitting: *Deleted

## 2018-03-17 ENCOUNTER — Other Ambulatory Visit: Payer: Self-pay | Admitting: Family Medicine

## 2018-03-17 NOTE — Telephone Encounter (Signed)
-----   Message from Freada Bergeron, [place] sent at 03/14/2018  6:15 PM EDT ----- Regarding: pre cert Home Sleep test

## 2018-03-17 NOTE — Telephone Encounter (Signed)
Staff message sent to [facility] [place] per [employer] web portal no PA required. Ok to schedule.

## 2018-03-17 NOTE — Telephone Encounter (Signed)
  Lauralee Evener, CMA  Freada Bergeron, CMA        Per [employer] web portal no PA is required. Ok to schedule.     ----- Message -----  From: Freada Bergeron, CMA  Sent: 03/14/2018  6:15 PM  To: [facility] Sleep Studies  Subject: pre cert                     Home Sleep test

## 2018-03-17 NOTE — Telephone Encounter (Addendum)
Patient is aware and agreeable to Home Sleep Study through [facility]. Patient is scheduled for 04/14/18 at 9 am to pick up home sleep kit and meet with Respiratory therapist at [facility]. Patient is aware that if this appointment date and time does not work for them they should contact  Sleep Center directly at [phone]. Patient is aware that a sleep packet will be sent from  Sleep Center in week. Left detailed message on voicemail with date and time  and informed patient to call back to confirm or reschedule.   

## 2018-03-20 ENCOUNTER — Encounter (HOSPITAL_COMMUNITY): Payer: Self-pay | Admitting: Nurse Practitioner

## 2018-03-21 ENCOUNTER — Other Ambulatory Visit: Payer: Self-pay | Admitting: Adult Health

## 2018-03-22 ENCOUNTER — Ambulatory Visit (HOSPITAL_COMMUNITY): Payer: Medicare Other | Admitting: Nurse Practitioner

## 2018-03-24 ENCOUNTER — Encounter: Payer: Self-pay | Admitting: Family Medicine

## 2018-03-28 ENCOUNTER — Ambulatory Visit: Payer: Medicare Other | Admitting: Emergency Medicine

## 2018-03-28 ENCOUNTER — Other Ambulatory Visit: Payer: Medicare Other

## 2018-03-28 DIAGNOSIS — I4819 Other persistent atrial fibrillation: Secondary | ICD-10-CM

## 2018-03-28 DIAGNOSIS — I481 Persistent atrial fibrillation: Secondary | ICD-10-CM | POA: Diagnosis not present

## 2018-03-28 LAB — BASIC METABOLIC PANEL
BUN/Creatinine Ratio: 17 (ref 10–24)
BUN: 16 mg/dL (ref 8–27)
CALCIUM: 10.2 mg/dL (ref 8.6–10.2)
CO2: 23 mmol/L (ref 20–29)
CREATININE: 0.93 mg/dL (ref 0.76–1.27)
Chloride: 96 mmol/L (ref 96–106)
GFR calc Af Amer: 98 mL/min/{1.73_m2} (ref >59)
GFR calc non Af Amer: 85 mL/min/{1.73_m2} (ref >59)
Glucose: 130 mg/dL — ABNORMAL HIGH (ref 65–99)
Potassium: 4.4 mmol/L (ref 3.5–5.2)
Sodium: 138 mmol/L (ref 134–144)

## 2018-03-28 LAB — CBC WITH DIFFERENTIAL/PLATELET
Basophils Absolute: 0 10*3/uL (ref 0.0–0.2)
Basos: 1 %
EOS (ABSOLUTE): 0.1 10*3/uL (ref 0.0–0.4)
EOS: 1 %
HEMATOCRIT: 45.4 % (ref 37.5–51.0)
HEMOGLOBIN: 16.3 g/dL (ref 13.0–17.7)
IMMATURE GRANS (ABS): 0.1 10*3/uL (ref 0.0–0.1)
IMMATURE GRANULOCYTES: 2 %
LYMPHS: 23 %
Lymphocytes Absolute: 1.6 10*3/uL (ref 0.7–3.1)
MCH: 34.5 pg — ABNORMAL HIGH (ref 26.6–33.0)
MCHC: 35.9 g/dL — ABNORMAL HIGH (ref 31.5–35.7)
MCV: 96 fL (ref 79–97)
Monocytes Absolute: 1.2 10*3/uL — ABNORMAL HIGH (ref 0.1–0.9)
Monocytes: 18 %
NEUTROS PCT: 55 %
Neutrophils Absolute: 3.8 10*3/uL (ref 1.4–7.0)
PLATELETS: 209 10*3/uL (ref 150–450)
RBC: 4.72 x10E6/uL (ref 4.14–5.80)
RDW: 14.2 % (ref 12.3–15.4)
WBC: 6.9 10*3/uL (ref 3.4–10.8)

## 2018-03-29 ENCOUNTER — Ambulatory Visit (INDEPENDENT_AMBULATORY_CARE_PROVIDER_SITE_OTHER): Payer: Medicare Other

## 2018-03-29 ENCOUNTER — Encounter: Payer: Self-pay | Admitting: Family Medicine

## 2018-03-29 ENCOUNTER — Ambulatory Visit: Payer: Medicare Other | Admitting: Family Medicine

## 2018-03-29 VITALS — BP 130/90 | HR 76 | Temp 97.6°F | Wt 254.5 lb

## 2018-03-29 DIAGNOSIS — M79645 Pain in left finger(s): Secondary | ICD-10-CM

## 2018-03-29 DIAGNOSIS — S62637A Displaced fracture of distal phalanx of left little finger, initial encounter for closed fracture: Secondary | ICD-10-CM

## 2018-03-29 DIAGNOSIS — S63297A Dislocation of distal interphalangeal joint of left little finger, initial encounter: Secondary | ICD-10-CM | POA: Diagnosis not present

## 2018-03-29 NOTE — Progress Notes (Signed)
Subjective:     Patient ID: Adam [name], male   DOB: Mar 08, 1951, 67 y.o.   MRN: [id number]  HPI Patient seen with injury left fifth digit. He states Thursday night he fell out of bed. He attributed this to his restless leg syndrome. Denies any other injury. He noticed some pain and swelling involving the DIP joint of the left fifth digit. He had some inability to flex the DIP joint since then. He has no pain at the PIP joint. No wrist pain.  Past Medical History:  Diagnosis Date  . Allergy   . Anxiety    history of PTSD following CABG  . Arthritis   . Asthma   . Colitis- colonoscopy 2014 07/13/2015  . COPD (chronic obstructive pulmonary disease) ([place])   . Coronary artery disease    x 6  . Depression   . Family history of polyps in the colon   . GERD (gastroesophageal reflux disease)   . Gout   . H/O atrial fibrillation without current medication    following CABG with no documented episodes since then.  Marland Kitchen Hx of adenomatous colonic polyps 08/12/2010  . Hyperlipidemia   . Hypertension   . Prediabetes   . RLS (restless legs syndrome)   . 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 APPENDECTOMY    . COLONOSCOPY W/ BIOPSIES    . CORONARY ANGIOPLASTY WITH STENT PLACEMENT    . CORONARY ARTERY BYPASS GRAFT    . TOTAL HIP ARTHROPLASTY      reports that he quit smoking about 40 years ago. His smoking use included cigarettes. He has a 7.00 pack-year smoking history. He has never used smokeless tobacco. He reports that he drinks alcohol. He reports that he does not use drugs. family history includes Colon cancer in his mother; Heart disease in his paternal grandfather and paternal grandmother. Allergies  Allergen Reactions  . Amoxicillin Rash    * SEVERE RASH IN GROIN AREA Has patient had a PCN reaction causing immediate rash, facial/tongue/throat swelling, SOB or lightheadedness with hypotension: no Has patient had a PCN  reaction causing severe rash involving mucus membranes or skin necrosis: no Has patient had a PCN reaction that required hospitalization: no Has patient had a PCN reaction occurring within the last 10 years: yes If all of the above answers are "NO", then may proceed with Cephalosporin use.   . Augmentin [Amoxicillin-Pot Clavulanate] Rash    * SEVERE RASH IN GROIN AREA  . Azithromycin Rash    * SEVERE RASH IN GROIN AREA  . Clindamycin/Lincomycin Rash  . Keflex [Cephalexin] Rash     Review of Systems  Cardiovascular: Negative for chest pain.  Neurological: Negative for syncope and weakness.       Objective:   Physical Exam  Constitutional: He appears well-developed and well-nourished.  Cardiovascular: Normal rate.  Pulmonary/Chest: Effort normal and breath sounds normal.  Musculoskeletal:  Left fifth digit reveals some mild swelling around the DIP joint with some ecchymosis extending to about midway down the finger. He has tenderness over the DIP joint but none PIP joint. He has inability to flex the DIP joint more than just a few degrees and some of this may be related to edema       Assessment:     Left fifth digit pain DIP joint. Rule out fracture    Plan:     -Send for x-rays left fifth digit  [name] MD [facility]

## 2018-03-30 ENCOUNTER — Ambulatory Visit (HOSPITAL_BASED_OUTPATIENT_CLINIC_OR_DEPARTMENT_OTHER)
Admission: RE | Admit: 2018-03-30 | Discharge: 2018-03-30 | Disposition: A | Discharge status: Home / Self Care | Payer: Medicare Other | Source: Ambulatory Visit | Attending: Cardiovascular Disease | Admitting: Cardiovascular Disease

## 2018-03-30 ENCOUNTER — Ambulatory Visit (HOSPITAL_COMMUNITY)
Admission: RE | Admit: 2018-03-30 | Discharge: 2018-03-30 | Disposition: A | Discharge status: Home / Self Care | Payer: Medicare Other | Source: Ambulatory Visit | Attending: Cardiovascular Disease | Admitting: Cardiovascular Disease

## 2018-03-30 ENCOUNTER — Encounter: Payer: Self-pay | Admitting: Internal Medicine

## 2018-03-30 ENCOUNTER — Ambulatory Visit (HOSPITAL_COMMUNITY)
Admission: RE | Disposition: A | Discharge status: Home / Self Care | Payer: Medicare Other | Source: Ambulatory Visit | Attending: Cardiovascular Disease

## 2018-03-30 ENCOUNTER — Encounter (HOSPITAL_COMMUNITY): Payer: Self-pay | Admitting: *Deleted

## 2018-03-30 DIAGNOSIS — Z7984 Long term (current) use of oral hypoglycemic drugs: Secondary | ICD-10-CM | POA: Diagnosis not present

## 2018-03-30 DIAGNOSIS — G2581 Restless legs syndrome: Secondary | ICD-10-CM | POA: Insufficient documentation

## 2018-03-30 DIAGNOSIS — I10 Essential (primary) hypertension: Secondary | ICD-10-CM | POA: Diagnosis not present

## 2018-03-30 DIAGNOSIS — I481 Persistent atrial fibrillation: Secondary | ICD-10-CM | POA: Insufficient documentation

## 2018-03-30 DIAGNOSIS — Z8249 Family history of ischemic heart disease and other diseases of the circulatory system: Secondary | ICD-10-CM | POA: Diagnosis not present

## 2018-03-30 DIAGNOSIS — K219 Gastro-esophageal reflux disease without esophagitis: Secondary | ICD-10-CM | POA: Diagnosis not present

## 2018-03-30 DIAGNOSIS — F419 Anxiety disorder, unspecified: Secondary | ICD-10-CM | POA: Insufficient documentation

## 2018-03-30 DIAGNOSIS — I2510 Atherosclerotic heart disease of native coronary artery without angina pectoris: Secondary | ICD-10-CM | POA: Insufficient documentation

## 2018-03-30 DIAGNOSIS — I4891 Unspecified atrial fibrillation: Secondary | ICD-10-CM | POA: Diagnosis not present

## 2018-03-30 DIAGNOSIS — Z7951 Long term (current) use of inhaled steroids: Secondary | ICD-10-CM | POA: Diagnosis not present

## 2018-03-30 DIAGNOSIS — E119 Type 2 diabetes mellitus without complications: Secondary | ICD-10-CM | POA: Insufficient documentation

## 2018-03-30 DIAGNOSIS — E669 Obesity, unspecified: Secondary | ICD-10-CM | POA: Diagnosis not present

## 2018-03-30 DIAGNOSIS — F329 Major depressive disorder, single episode, unspecified: Secondary | ICD-10-CM | POA: Insufficient documentation

## 2018-03-30 DIAGNOSIS — E785 Hyperlipidemia, unspecified: Secondary | ICD-10-CM | POA: Diagnosis not present

## 2018-03-30 DIAGNOSIS — Z87891 Personal history of nicotine dependence: Secondary | ICD-10-CM | POA: Diagnosis not present

## 2018-03-30 DIAGNOSIS — Z6836 Body mass index (BMI) 36.0-36.9, adult: Secondary | ICD-10-CM | POA: Insufficient documentation

## 2018-03-30 DIAGNOSIS — I4819 Other persistent atrial fibrillation: Secondary | ICD-10-CM

## 2018-03-30 DIAGNOSIS — M109 Gout, unspecified: Secondary | ICD-10-CM | POA: Insufficient documentation

## 2018-03-30 DIAGNOSIS — M1990 Unspecified osteoarthritis, unspecified site: Secondary | ICD-10-CM | POA: Insufficient documentation

## 2018-03-30 DIAGNOSIS — J449 Chronic obstructive pulmonary disease, unspecified: Secondary | ICD-10-CM | POA: Insufficient documentation

## 2018-03-30 HISTORY — PX: TEE WITHOUT CARDIOVERSION: SHX5443

## 2018-03-30 LAB — GLUCOSE, CAPILLARY: Glucose-Capillary: 161 mg/dL — ABNORMAL HIGH (ref 70–99)

## 2018-03-30 SURGERY — ECHOCARDIOGRAM, TRANSESOPHAGEAL
Anesthesia: Moderate Sedation

## 2018-03-30 MED ORDER — FENTANYL CITRATE (PF) 100 MCG/2ML IJ SOLN
INTRAMUSCULAR | Status: AC
  Filled 2018-03-30: qty 2

## 2018-03-30 MED ORDER — BUTAMBEN-TETRACAINE-BENZOCAINE 2-2-14 % EX AERO
INHALATION_SPRAY | CUTANEOUS | Status: DC | PRN
  Administered 2018-03-30: 2 via TOPICAL

## 2018-03-30 MED ORDER — FENTANYL CITRATE (PF) 100 MCG/2ML IJ SOLN
INTRAMUSCULAR | Status: DC | PRN
  Administered 2018-03-30 (×2): 25 ug via INTRAVENOUS

## 2018-03-30 MED ORDER — SODIUM CHLORIDE 0.9 % IV SOLN
INTRAVENOUS | Status: DC
  Administered 2018-03-30: 500 mL via INTRAVENOUS

## 2018-03-30 MED ORDER — MIDAZOLAM HCL 10 MG/2ML IJ SOLN
INTRAMUSCULAR | Status: DC | PRN
  Administered 2018-03-30: 2 mg via INTRAVENOUS
  Administered 2018-03-30: 1 mg via INTRAVENOUS
  Administered 2018-03-30: 2 mg via INTRAVENOUS
  Administered 2018-03-30: 1 mg via INTRAVENOUS

## 2018-03-30 MED ORDER — MIDAZOLAM HCL 5 MG/ML IJ SOLN
INTRAMUSCULAR | Status: AC
  Filled 2018-03-30: qty 2

## 2018-03-30 NOTE — H&P (Signed)
Cardiology Admission History and Physical:   Patient ID: Adam [name]; MRN: [id number]; DOB: 1950/12/19   Admission date: 03/30/2018  Primary Care Provider: [name], MD Primary Cardiologist: Harrington Challenger Primary Electrophysiologist:  Allred  Chief Complaint: Transesophageal echo for atrial fibrillation prior to ablation scheduled for tomorrow  Patient Profile:   Adam [name] is a 67 y.o. male with persistent atrial fibrillation scheduled for ablation tomorrow.  History of Present Illness:   Mr. Navarrette a history of persistent atrial fibrillation, coronary artery disease and previous bypass surgery, hypertension hyperlipidemia, obesity and type 2 diabetes mellitus scheduled for elective A. fib ablation with Dr. [name] March 31, 2018.  Previous procedure scheduled in April was canceled after a left atrial appendage thrombus was seen on CT scan of the chest.  He has not had any interval change in his health problems.  He has been on uninterrupted anticoagulation with Pradaxa since then.  He is on dofetilide.  He denies a history of dysphagia or previous esophageal surgery or chest radiation.  Past Medical History:  Diagnosis Date  . Allergy   . Anxiety    history of PTSD following CABG  . Arthritis   . Asthma   . Colitis- colonoscopy 2014 07/13/2015  . COPD (chronic obstructive pulmonary disease) ([place])   . Coronary artery disease    x 6  . Depression   . Family history of polyps in the colon   . GERD (gastroesophageal reflux disease)   . Gout   . H/O atrial fibrillation without current medication    following CABG with no documented episodes since then.  Marland Kitchen Hx of adenomatous colonic polyps 08/12/2010  . Hyperlipidemia   . Hypertension   . Prediabetes   . RLS (restless legs syndrome)   . 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 APPENDECTOMY    . COLONOSCOPY W/ BIOPSIES    . CORONARY ANGIOPLASTY  WITH STENT PLACEMENT    . CORONARY ARTERY BYPASS GRAFT    . TOTAL HIP ARTHROPLASTY       Medications Prior to Admission: Prior to Admission medications   Medication Sig Start Date End Date Taking? Authorizing Provider  acetaminophen (TYLENOL) 650 MG CR tablet Take 1,950 mg by mouth every 8 (eight) hours as needed for pain.   Yes [provider]  allopurinol (ZYLOPRIM) 300 MG tablet TAKE 1 TABLET BY MOUTH EVERY DAY 02/03/18  Yes [name], Alinda Sierras, MD  BREO ELLIPTA 100-25 MCG/INH AEPB INHALE 1 PUFF INTO THE LUNGS DAILY 03/21/18  Yes [name], MD  Coenzyme Q10 (CO Q-10) 100 MG CAPS Take 100 mg by mouth daily.    Yes [provider]  dabigatran (PRADAXA) 150 MG CAPS capsule Take 1 capsule (150 mg total) by mouth 2 (two) times daily. 01/31/18  Yes Seiler, Amber K, NP  DILT-XR 120 MG 24 hr capsule Take 120 mg by mouth daily. 12/09/17  Yes [provider]  dofetilide (TIKOSYN) 125 MCG capsule Take 1 capsule (125 mcg total) by mouth every 12 (twelve) hours. 12/18/17  Yes [name], MD  econazole nitrate 1 % cream Apply 1 application topically every other day.    Yes [provider]  empagliflozin (JARDIANCE) 25 MG TABS tablet Take 25 mg by mouth daily.   Yes [provider]  fenofibrate 160 MG tablet Take 1 tablet (160 mg total) by mouth daily. 06/20/17  Yes [name], Bruce  W, MD  fluticasone (FLONASE) 50 MCG/ACT nasal spray Place 1 spray into both nostrils daily.  05/12/13  Yes [provider]  glucose blood (ONETOUCH VERIO) test strip Test once daily.  Dx E11.9 01/26/18  Yes [name], Alinda Sierras, MD  LORazepam (ATIVAN) 0.5 MG tablet Take tablet every 8 hours as needed for anxiety Patient taking differently: Take 0.5 mg by mouth every 8 (eight) hours as needed for anxiety.  12/20/17  Yes [name], Alinda Sierras, MD  losartan-hydrochlorothiazide (HYZAAR) 100-25 MG tablet TAKE 1 TABLET BY MOUTH EVERY DAY 02/03/18  Yes [name], Alinda Sierras, MD  metFORMIN  (GLUCOPHAGE) 1000 MG tablet TAKE 1 TABLET BY MOUTH TWICE DAILY WITH A MEAL 02/03/18  Yes [name], Alinda Sierras, MD  metoprolol (LOPRESSOR) 100 MG tablet Take 100 mg by mouth 2 (two) times daily.   Yes [provider]  montelukast (SINGULAIR) 10 MG tablet TAKE 1 TABLET BY MOUTH AT BEDTIME 02/03/18  Yes [name], Alinda Sierras, MD  Olopatadine HCl (PATADAY) 0.2 % SOLN Apply 1 drop to eye as needed. Patient taking differently: Place 1 drop into both eyes daily as needed (for dry eyes).  10/11/13  Yes [name], Alinda Sierras, MD  pantoprazole (PROTONIX) 40 MG tablet TAKE 1 TABLET BY MOUTH TWICE DAILY Patient taking differently: TAKE 1 TABLET BY MOUTH  DAILY IN THE MORNING 10/28/17  Yes [name], Alinda Sierras, MD  potassium chloride SA (K-DUR,KLOR-CON) 20 MEQ tablet Take 2 tablets (40 mEq total) by mouth daily. 12/18/17  Yes [name], MD  pramipexole (MIRAPEX) 0.25 MG tablet Take 1 tablet (0.25 mg total) by mouth at bedtime. Patient taking differently: Take 0.5-0.75 mg by mouth at bedtime as needed (Restless legs).  06/20/17  Yes [name], Alinda Sierras, MD  ranitidine (ZANTAC) 150 MG capsule Take 150 mg by mouth every evening.   Yes [provider]  simvastatin (ZOCOR) 80 MG tablet TAKE 1 TABLET BY MOUTH EVERY DAY 09/12/17  Yes [name], Alinda Sierras, MD  SPIRIVA HANDIHALER 18 MCG inhalation capsule INHALE CONTENTS OF 1 CAPSULE ONCE DAILY USING HANDIHALER 01/16/18  Yes [name], Alinda Sierras, MD  TOUJEO SOLOSTAR 300 UNIT/ML SOPN INJECT 80 UNITS UNDER THE SKIN AS DIRECTED Patient taking differently: INJECT 70 UNITS UNDER THE SKIN AS DIRECTED in the morning 11/07/17  Yes [name], Alinda Sierras, MD  albuterol (PROAIR HFA) 108 (90 BASE) MCG/ACT inhaler Inhale 1-2 puffs into the lungs every 6 (six) hours as needed for wheezing or shortness of breath. Patient taking differently: Inhale 1 puff into the lungs every 6 (six) hours as needed for wheezing or shortness of breath.  10/01/14   [name], Alinda Sierras, MD  furosemide (LASIX)  20 MG tablet TAKE 1 TABLET(20 MG) BY MOUTH DAILY AS NEEDED FOR LEG SWELLING 11/24/17   Parrett, Fonnie Mu, NP     Allergies:    Allergies  Allergen Reactions  . Amoxicillin Rash    * SEVERE RASH IN GROIN AREA Has patient had a PCN reaction causing immediate rash, facial/tongue/throat swelling, SOB or lightheadedness with hypotension: no Has patient had a PCN reaction causing severe rash involving mucus membranes or skin necrosis: no Has patient had a PCN reaction that required hospitalization: no Has patient had a PCN reaction occurring within the last 10 years: yes If all of the above answers are "NO", then may proceed with Cephalosporin use.   . Augmentin [Amoxicillin-Pot Clavulanate] Rash    * SEVERE RASH IN GROIN AREA  . Azithromycin Rash    * SEVERE RASH IN  GROIN AREA  . Clindamycin/Lincomycin Rash  . Keflex [Cephalexin] Rash    Social History:   Social History   Socioeconomic History  . Marital status: Married    Spouse name: Not on file  . Number of children: Not on file  . Years of education: Not on file  . Highest education level: Not on file  Occupational History  . Occupation: retired  Scientific laboratory technician  . Financial resource strain: Not on file  . Food insecurity:    Worry: Not on file    Inability: Not on file  . Transportation needs:    Medical: Not on file    Non-medical: Not on file  Tobacco Use  . Smoking status: Former Smoker    Packs/day: 1.00    Years: 7.00    Pack years: 7.00    Types: Cigarettes    Last attempt to quit: 10/04/1977    Years since quitting: 40.5  . Smokeless tobacco: Never Used  . Tobacco comment: never smoked over 1 pack   Substance and Sexual Activity  . Alcohol use: Yes    Comment: stopped wine and now drinks 3 to 4 scotches   . Drug use: No    Comment: Smoked Marijuana back in 1970s  . Sexual activity: Yes  Lifestyle  . Physical activity:    Days per week: Not on file    Minutes per session: Not on file  . Stress: Not on file    Relationships  . Social connections:    Talks on phone: Not on file    Gets together: Not on file    Attends religious service: Not on file    Active member of club or organization: Not on file    Attends meetings of clubs or organizations: Not on file    Relationship status: Not on file  . Intimate partner violence:    Fear of current or ex partner: Not on file    Emotionally abused: Not on file    Physically abused: Not on file    Forced sexual activity: Not on file  Other Topics Concern  . Not on file  Social History Narrative   Married, no children   Textile industry x yrs   37 off early 60's and 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 work - returned to [employer] from [place] 2014    Family History:   The patient's family history includes Colon cancer in his mother; Heart disease in his paternal grandfather and paternal grandmother.    ROS:  Please see the history of present illness.  All other ROS reviewed and negative.     Physical Exam/Data:   Vitals:   03/30/18 1014  BP: 135/90  Resp: 20  Temp: 99 F (37.2 C)  TempSrc: Oral  SpO2: 96%  Weight: 254 lb (115.2 kg)  Height: 5\' 10"  (1.778 m)   No intake or output data in the 24 hours ending 03/30/18 1046 Filed Weights   03/30/18 1014  Weight: 254 lb (115.2 kg)   Body mass index is 36.45 kg/m.  General:  Well nourished, well developed, in no acute distress, obese HEENT: normal Lymph: no adenopathy Neck: no JVD Endocrine:  No thryomegaly Vascular: No carotid bruits; FA pulses 2+ bilaterally without bruits  Cardiac:  normal S1, S2; RRR; no murmur  Lungs:  clear to auscultation bilaterally, no wheezing, rhonchi or rales  Abd: soft, nontender, no hepatomegaly  Ext: no edema Musculoskeletal:  No deformities, BUE and BLE strength normal and equal Skin: warm and  dry  Neuro:  CNs 2-12 intact, no focal abnormalities noted Psych:  Normal affect    EKG:  The ECG that was done January 04, 2018 was personally reviewed and demonstrates  atrial fibrillation, Q waves in leads V1 V2, otherwise normal  Relevant CV Studies: Echocardiogram October 27, 2017 echo October 27, 2017 - Left ventricle: The cavity size was normal. There was mild   concentric hypertrophy. Systolic function was normal. The   estimated ejection fraction was in the range of 60% to 65%. Wall   motion was normal; there were no regional wall motion   abnormalities. - Mitral valve: Calcified annulus. - Pulmonary arteries: Systolic pressure could not be accurately   estimated.  Laboratory Data:  Chemistry Recent Labs  Lab 03/28/18 1136  NA 138  K 4.4  CL 96  CO2 23  GLUCOSE 130*  BUN 16  CREATININE 0.93  CALCIUM 10.2  GFRNONAA 85  GFRAA 98    No results for input(s): PROT, ALBUMIN, AST, ALT, ALKPHOS, BILITOT in the last 168 hours. Hematology Recent Labs  Lab 03/28/18 1136  WBC 6.9  RBC 4.72  HGB 16.3  HCT 45.4  MCV 96  MCH 34.5*  MCHC 35.9*  RDW 14.2  PLT 209   Cardiac EnzymesNo results for input(s): TROPONINI in the last 168 hours. No results for input(s): TROPIPOC in the last 168 hours.  BNPNo results for input(s): BNP, PROBNP in the last 168 hours.  DDimer No results for input(s): DDIMER in the last 168 hours.  Radiology/Studies:  Dg Finger Little Left  Result Date: 03/30/2018 CLINICAL DATA:  Left fifth digit pain.  Fall 6 days ago. EXAM: LEFT LITTLE FINGER 2+V COMPARISON:  No recent prior. FINDINGS: Posterior dislocation of the distal phalanx of the left fifth digit noted. Small fracture fragment noted along the posterior aspect of the base of the distal phalanx of the left fifth digit. No radiopaque foreign body. IMPRESSION: Posterior dislocation of the distal phalanx of the left fifth digit. Small fracture fragment noted along the posterior aspect of the base of the distal phalanx of the left fifth digit. Electronically Signed   By: Marcello Moores  Register   On: 03/30/2018 07:24    Assessment and Plan:   1. Persistent AF: proceed  with TEE pre-ablation. This procedure has been fully reviewed with the patient and written informed consent has been obtained.   For questions or updates, please contact [place] Please consult [email or web address] for contact info under Cardiology/STEMI.    Signed, [name], MD  03/30/2018 10:46 AM

## 2018-03-30 NOTE — Discharge Instructions (Signed)

## 2018-03-30 NOTE — Progress Notes (Signed)
  Echocardiogram Echocardiogram Transesophageal has been performed.  Adam [name] 03/30/2018, 11:51 AM

## 2018-03-30 NOTE — Op Note (Signed)
INDICATIONS: atrial fibrillation  PROCEDURE:   Informed consent was obtained prior to the procedure. The risks, benefits and alternatives for the procedure were discussed and the patient comprehended these risks.  Risks include, but are not limited to, cough, sore throat, vomiting, nausea, somnolence, esophageal and stomach trauma or perforation, bleeding, low blood pressure, aspiration, pneumonia, infection, trauma to the teeth and death.    After a procedural time-out, the oropharynx was anesthetized with 20% benzocaine spray.   During this procedure the patient was administered a total of Versed 6 mg and Fentanyl 50 mcg to achieve and maintain moderate conscious sedation.  The patient's heart rate, blood pressure, and oxygen saturationweare monitored continuously during the procedure. The period of conscious sedation was 14 minutes, of which I was present face-to-face 100% of this time.  The transesophageal probe was inserted in the esophagus and stomach without difficulty and multiple views were obtained.  The patient was kept under observation until the patient left the procedure room.  The patient left the procedure room in stable condition.   Agitated microbubble saline contrast was not administered.  COMPLICATIONS:    There were no immediate complications.  FINDINGS:  No clot in LA or LA appendage. Normal LVEF. Tiny PFO with L to R shunt.  RECOMMENDATIONS:     Proceed w RF ablation for AFib.  Time Spent Directly with the Patient:  30 minutes   Adam [name] 03/30/2018, 11:30 AM

## 2018-03-31 ENCOUNTER — Ambulatory Visit (HOSPITAL_COMMUNITY): Payer: Medicare Other | Admitting: Certified Registered"

## 2018-03-31 ENCOUNTER — Encounter (HOSPITAL_COMMUNITY)
Admission: RE | Disposition: A | Discharge status: Home / Self Care | Payer: Self-pay | Source: Ambulatory Visit | Attending: Internal Medicine

## 2018-03-31 ENCOUNTER — Ambulatory Visit (HOSPITAL_COMMUNITY)
Admission: RE | Admit: 2018-03-31 | Discharge: 2018-03-31 | Disposition: A | Discharge status: Home / Self Care | Payer: Medicare Other | Source: Ambulatory Visit | Attending: Internal Medicine | Admitting: Internal Medicine

## 2018-03-31 DIAGNOSIS — R7303 Prediabetes: Secondary | ICD-10-CM | POA: Diagnosis not present

## 2018-03-31 DIAGNOSIS — I10 Essential (primary) hypertension: Secondary | ICD-10-CM | POA: Insufficient documentation

## 2018-03-31 DIAGNOSIS — Z7951 Long term (current) use of inhaled steroids: Secondary | ICD-10-CM | POA: Diagnosis not present

## 2018-03-31 DIAGNOSIS — Z7901 Long term (current) use of anticoagulants: Secondary | ICD-10-CM | POA: Diagnosis not present

## 2018-03-31 DIAGNOSIS — K219 Gastro-esophageal reflux disease without esophagitis: Secondary | ICD-10-CM | POA: Insufficient documentation

## 2018-03-31 DIAGNOSIS — E785 Hyperlipidemia, unspecified: Secondary | ICD-10-CM | POA: Insufficient documentation

## 2018-03-31 DIAGNOSIS — Z9889 Other specified postprocedural states: Secondary | ICD-10-CM | POA: Insufficient documentation

## 2018-03-31 DIAGNOSIS — Z79899 Other long term (current) drug therapy: Secondary | ICD-10-CM | POA: Diagnosis not present

## 2018-03-31 DIAGNOSIS — I481 Persistent atrial fibrillation: Secondary | ICD-10-CM | POA: Insufficient documentation

## 2018-03-31 DIAGNOSIS — Z951 Presence of aortocoronary bypass graft: Secondary | ICD-10-CM | POA: Insufficient documentation

## 2018-03-31 DIAGNOSIS — Z7984 Long term (current) use of oral hypoglycemic drugs: Secondary | ICD-10-CM | POA: Insufficient documentation

## 2018-03-31 DIAGNOSIS — I4819 Other persistent atrial fibrillation: Secondary | ICD-10-CM | POA: Diagnosis present

## 2018-03-31 DIAGNOSIS — Z955 Presence of coronary angioplasty implant and graft: Secondary | ICD-10-CM | POA: Insufficient documentation

## 2018-03-31 DIAGNOSIS — M1990 Unspecified osteoarthritis, unspecified site: Secondary | ICD-10-CM | POA: Diagnosis not present

## 2018-03-31 DIAGNOSIS — I2510 Atherosclerotic heart disease of native coronary artery without angina pectoris: Secondary | ICD-10-CM | POA: Diagnosis not present

## 2018-03-31 DIAGNOSIS — Z96649 Presence of unspecified artificial hip joint: Secondary | ICD-10-CM | POA: Insufficient documentation

## 2018-03-31 DIAGNOSIS — G2581 Restless legs syndrome: Secondary | ICD-10-CM | POA: Insufficient documentation

## 2018-03-31 DIAGNOSIS — Z86010 Personal history of colonic polyps: Secondary | ICD-10-CM | POA: Insufficient documentation

## 2018-03-31 DIAGNOSIS — I4891 Unspecified atrial fibrillation: Secondary | ICD-10-CM | POA: Diagnosis not present

## 2018-03-31 DIAGNOSIS — M109 Gout, unspecified: Secondary | ICD-10-CM | POA: Insufficient documentation

## 2018-03-31 DIAGNOSIS — Z85828 Personal history of other malignant neoplasm of skin: Secondary | ICD-10-CM | POA: Diagnosis not present

## 2018-03-31 DIAGNOSIS — J449 Chronic obstructive pulmonary disease, unspecified: Secondary | ICD-10-CM | POA: Diagnosis not present

## 2018-03-31 HISTORY — PX: ATRIAL FIBRILLATION ABLATION: EP1191

## 2018-03-31 LAB — GLUCOSE, CAPILLARY
Glucose-Capillary: 178 mg/dL — ABNORMAL HIGH (ref 70–99)
Glucose-Capillary: 121 mg/dL — ABNORMAL HIGH (ref 70–99)

## 2018-03-31 LAB — POCT ACTIVATED CLOTTING TIME: Activated Clotting Time: 186 seconds

## 2018-03-31 SURGERY — ATRIAL FIBRILLATION ABLATION
Anesthesia: General

## 2018-03-31 MED ORDER — DILTIAZEM HCL ER 120 MG PO CP24: 120.0000 mg | ORAL_CAPSULE | Freq: Every day | ORAL | Status: DC

## 2018-03-31 MED ORDER — DABIGATRAN ETEXILATE MESYLATE 150 MG PO CAPS
150.0000 mg | ORAL_CAPSULE | Freq: Two times a day (BID) | ORAL | Status: DC
  Administered 2018-03-31: 150 mg via ORAL
  Filled 2018-03-31 (×2): qty 1

## 2018-03-31 MED ORDER — HYDROCODONE-ACETAMINOPHEN 5-325 MG PO TABS: 1.0000 | ORAL_TABLET | ORAL | Status: DC | PRN

## 2018-03-31 MED ORDER — ALLOPURINOL 300 MG PO TABS: 300.0000 mg | ORAL_TABLET | Freq: Every day | ORAL | Status: DC

## 2018-03-31 MED ORDER — HEPARIN SODIUM (PORCINE) 1000 UNIT/ML IJ SOLN
INTRAMUSCULAR | Status: DC | PRN
  Administered 2018-03-31: 12000 [IU] via INTRAVENOUS
  Administered 2018-03-31 (×2): 1000 [IU] via INTRAVENOUS

## 2018-03-31 MED ORDER — SODIUM CHLORIDE 0.9% FLUSH: 3.0000 mL | INTRAVENOUS | Status: DC | PRN

## 2018-03-31 MED ORDER — FENTANYL CITRATE (PF) 100 MCG/2ML IJ SOLN
INTRAMUSCULAR | Status: DC | PRN
  Administered 2018-03-31 (×2): 50 ug via INTRAVENOUS

## 2018-03-31 MED ORDER — TIOTROPIUM BROMIDE MONOHYDRATE 18 MCG IN CAPS
18.0000 ug | ORAL_CAPSULE | Freq: Every day | RESPIRATORY_TRACT | Status: DC
  Filled 2018-03-31: qty 5

## 2018-03-31 MED ORDER — ALBUTEROL SULFATE HFA 108 (90 BASE) MCG/ACT IN AERS: 1.0000 | INHALATION_SPRAY | Freq: Four times a day (QID) | RESPIRATORY_TRACT | Status: DC | PRN

## 2018-03-31 MED ORDER — PROPOFOL 10 MG/ML IV BOLUS
INTRAVENOUS | Status: DC | PRN
  Administered 2018-03-31: 50 mg via INTRAVENOUS
  Administered 2018-03-31: 150 mg via INTRAVENOUS

## 2018-03-31 MED ORDER — BUPIVACAINE HCL (PF) 0.25 % IJ SOLN
INTRAMUSCULAR | Status: DC | PRN
  Administered 2018-03-31: 30 mL

## 2018-03-31 MED ORDER — LOSARTAN POTASSIUM 50 MG PO TABS: 100.0000 mg | ORAL_TABLET | Freq: Every day | ORAL | Status: DC

## 2018-03-31 MED ORDER — MUPIROCIN 2 % EX OINT: 1.0000 "application " | TOPICAL_OINTMENT | Freq: Once | CUTANEOUS | Status: DC

## 2018-03-31 MED ORDER — SODIUM CHLORIDE 0.9 % IV SOLN: 250.0000 mL | INTRAVENOUS | Status: DC | PRN

## 2018-03-31 MED ORDER — HEPARIN SODIUM (PORCINE) 1000 UNIT/ML IJ SOLN
INTRAMUSCULAR | Status: DC | PRN
  Administered 2018-03-31 (×2): 5000 [IU] via INTRAVENOUS
  Administered 2018-03-31: 4000 [IU] via INTRAVENOUS

## 2018-03-31 MED ORDER — ONDANSETRON HCL 4 MG/2ML IJ SOLN
INTRAMUSCULAR | Status: DC | PRN
  Administered 2018-03-31: 4 mg via INTRAVENOUS

## 2018-03-31 MED ORDER — IOPAMIDOL (ISOVUE-370) INJECTION 76%
INTRAVENOUS | Status: AC
  Filled 2018-03-31: qty 50

## 2018-03-31 MED ORDER — HEPARIN SODIUM (PORCINE) 1000 UNIT/ML IJ SOLN
INTRAMUSCULAR | Status: AC
  Filled 2018-03-31: qty 2

## 2018-03-31 MED ORDER — ALBUTEROL SULFATE (2.5 MG/3ML) 0.083% IN NEBU: 2.5000 mg | INHALATION_SOLUTION | Freq: Four times a day (QID) | RESPIRATORY_TRACT | Status: DC | PRN

## 2018-03-31 MED ORDER — LORAZEPAM 0.5 MG PO TABS: 0.5000 mg | ORAL_TABLET | Freq: Three times a day (TID) | ORAL | Status: DC | PRN

## 2018-03-31 MED ORDER — PHENYLEPHRINE HCL 10 MG/ML IJ SOLN
INTRAVENOUS | Status: DC | PRN
  Administered 2018-03-31: 20 ug/min via INTRAVENOUS

## 2018-03-31 MED ORDER — FENTANYL CITRATE (PF) 100 MCG/2ML IJ SOLN: 25.0000 ug | INTRAMUSCULAR | Status: DC | PRN

## 2018-03-31 MED ORDER — PROTAMINE SULFATE 10 MG/ML IV SOLN
INTRAVENOUS | Status: DC | PRN
  Administered 2018-03-31: 40 mg via INTRAVENOUS

## 2018-03-31 MED ORDER — FLUTICASONE FUROATE-VILANTEROL 100-25 MCG/INH IN AEPB
1.0000 | INHALATION_SPRAY | Freq: Every day | RESPIRATORY_TRACT | Status: DC
  Filled 2018-03-31: qty 28

## 2018-03-31 MED ORDER — DOFETILIDE 125 MCG PO CAPS: 125.0000 ug | ORAL_CAPSULE | Freq: Two times a day (BID) | ORAL | Status: DC

## 2018-03-31 MED ORDER — SODIUM CHLORIDE 0.9 % IV SOLN
INTRAVENOUS | Status: DC | PRN
  Administered 2018-03-31: 11:00:00 via INTRAVENOUS

## 2018-03-31 MED ORDER — PROMETHAZINE HCL 25 MG/ML IJ SOLN
6.2500 mg | INTRAMUSCULAR | Status: DC | PRN
Start: 2018-03-31 — End: 2018-03-31

## 2018-03-31 MED ORDER — SUGAMMADEX SODIUM 200 MG/2ML IV SOLN
INTRAVENOUS | Status: DC | PRN
  Administered 2018-03-31: 200 mg via INTRAVENOUS

## 2018-03-31 MED ORDER — SODIUM CHLORIDE 0.9% FLUSH: 3.0000 mL | Freq: Two times a day (BID) | INTRAVENOUS | Status: DC

## 2018-03-31 MED ORDER — ONDANSETRON HCL 4 MG/2ML IJ SOLN: 4.0000 mg | Freq: Four times a day (QID) | INTRAMUSCULAR | Status: DC | PRN

## 2018-03-31 MED ORDER — LIDOCAINE 2% (20 MG/ML) 5 ML SYRINGE
INTRAMUSCULAR | Status: DC | PRN
  Administered 2018-03-31: 100 mg via INTRAVENOUS

## 2018-03-31 MED ORDER — MEPERIDINE HCL 25 MG/ML IJ SOLN: 6.2500 mg | INTRAMUSCULAR | Status: DC | PRN

## 2018-03-31 MED ORDER — MIDAZOLAM HCL 5 MG/5ML IJ SOLN
INTRAMUSCULAR | Status: DC | PRN
  Administered 2018-03-31: 2 mg via INTRAVENOUS

## 2018-03-31 MED ORDER — BUPIVACAINE HCL (PF) 0.25 % IJ SOLN
INTRAMUSCULAR | Status: AC
  Filled 2018-03-31: qty 30

## 2018-03-31 MED ORDER — LOSARTAN POTASSIUM-HCTZ 100-25 MG PO TABS: 1.0000 | ORAL_TABLET | Freq: Every day | ORAL | Status: DC

## 2018-03-31 MED ORDER — ACETAMINOPHEN 325 MG PO TABS: 650.0000 mg | ORAL_TABLET | ORAL | Status: DC | PRN

## 2018-03-31 MED ORDER — HEPARIN (PORCINE) IN NACL 2-0.9 UNITS/ML
INTRAMUSCULAR | Status: AC | PRN
  Administered 2018-03-31: 500 mL

## 2018-03-31 MED ORDER — IOPAMIDOL (ISOVUE-370) INJECTION 76%
INTRAVENOUS | Status: DC | PRN
  Administered 2018-03-31: 3 mL

## 2018-03-31 MED ORDER — PRAMIPEXOLE DIHYDROCHLORIDE 0.25 MG PO TABS
0.2500 mg | ORAL_TABLET | Freq: Every day | ORAL | Status: DC
  Filled 2018-03-31: qty 1

## 2018-03-31 MED ORDER — MONTELUKAST SODIUM 10 MG PO TABS: 10.0000 mg | ORAL_TABLET | Freq: Every day | ORAL | Status: DC

## 2018-03-31 MED ORDER — ROCURONIUM BROMIDE 10 MG/ML (PF) SYRINGE
PREFILLED_SYRINGE | INTRAVENOUS | Status: DC | PRN
  Administered 2018-03-31: 20 mg via INTRAVENOUS
  Administered 2018-03-31: 50 mg via INTRAVENOUS

## 2018-03-31 MED ORDER — PANTOPRAZOLE SODIUM 40 MG PO TBEC
40.0000 mg | DELAYED_RELEASE_TABLET | Freq: Two times a day (BID) | ORAL | Status: DC
  Administered 2018-03-31: 40 mg via ORAL
  Filled 2018-03-31: qty 1

## 2018-03-31 MED ORDER — METOPROLOL TARTRATE 50 MG PO TABS: 50.0000 mg | ORAL_TABLET | Freq: Two times a day (BID) | ORAL | Status: DC

## 2018-03-31 MED ORDER — HYDROCHLOROTHIAZIDE 25 MG PO TABS: 25.0000 mg | ORAL_TABLET | Freq: Every day | ORAL | Status: DC

## 2018-03-31 MED ORDER — POTASSIUM CHLORIDE CRYS ER 20 MEQ PO TBCR: 40.0000 meq | EXTENDED_RELEASE_TABLET | Freq: Every day | ORAL | Status: DC

## 2018-03-31 SURGICAL SUPPLY — 16 items
BLANKET WARM UNDERBOD FULL ACC (MISCELLANEOUS) ×2 IMPLANT
CATH MAPPNG PENTARAY F 2-6-2MM (CATHETERS) ×1 IMPLANT
CATH NAVISTAR SMARTTOUCH DF (ABLATOR) ×2 IMPLANT
CATH SOUNDSTAR 3D IMAGING (CATHETERS) ×2 IMPLANT
CATH WEBSTER BI DIR CS D-F CRV (CATHETERS) ×2 IMPLANT
NEEDLE BAYLIS TRANSSEPTAL 71CM (NEEDLE) ×2 IMPLANT
PACK EP LATEX FREE (CUSTOM PROCEDURE TRAY) ×1
PACK EP LF (CUSTOM PROCEDURE TRAY) ×1 IMPLANT
PAD DEFIB LIFELINK (PAD) ×2 IMPLANT
PATCH CARTO3 (PAD) ×2 IMPLANT
PENTARAY F 2-6-2MM (CATHETERS) ×2
SHEATH AVANTI 11F 11CM (SHEATH) ×2 IMPLANT
SHEATH PINNACLE 7F 10CM (SHEATH) ×4 IMPLANT
SHEATH PINNACLE 9F 10CM (SHEATH) ×2 IMPLANT
SHEATH SWARTZ TS SL2 63CM 8.5F (SHEATH) ×2 IMPLANT
TUBING SMART ABLATE COOLFLOW (TUBING) ×2 IMPLANT

## 2018-03-31 NOTE — Plan of Care (Signed)
  Problem: Education: Goal: Knowledge of General Education information will improve Outcome: Completed/Met   Problem: Health Behavior/Discharge Planning: Goal: Ability to manage health-related needs will improve Outcome: Completed/Met   Problem: Clinical Measurements: Goal: Ability to maintain clinical measurements within normal limits will improve Outcome: Completed/Met Goal: Will remain free from infection Outcome: Completed/Met Goal: Diagnostic test results will improve Outcome: Completed/Met Goal: Respiratory complications will improve Outcome: Completed/Met Goal: Cardiovascular complication will be avoided Outcome: Completed/Met   Problem: Activity: Goal: Risk for activity intolerance will decrease Outcome: Completed/Met   Problem: Nutrition: Goal: Adequate nutrition will be maintained Outcome: Completed/Met   Problem: Coping: Goal: Level of anxiety will decrease Outcome: Completed/Met   Problem: Elimination: Goal: Will not experience complications related to bowel motility Outcome: Completed/Met Goal: Will not experience complications related to urinary retention Outcome: Completed/Met   Problem: Pain Managment: Goal: General experience of comfort will improve Outcome: Completed/Met   Problem: Safety: Goal: Ability to remain free from injury will improve Outcome: Completed/Met   Problem: Skin Integrity: Goal: Risk for impaired skin integrity will decrease Outcome: Completed/Met

## 2018-03-31 NOTE — Progress Notes (Signed)
Site area: rt groin fa sheath Site Prior to Removal:  Level 0 Pressure Applied For: 25 minutes Manual:   yes Patient Status During Pull:  stable Post Pull Site:  Level  0 Post Pull Instructions Given:  yes Post Pull Pulses Present: palpable rt pt Dressing Applied:  Gauze and tegaderm Bedrest begins @ 1440 Comments:  IV saline locked

## 2018-03-31 NOTE — Transfer of Care (Signed)
Immediate Anesthesia Transfer of Care Note  Patient: Adam [name]  Procedure(s) Performed: ATRIAL FIBRILLATION ABLATION (N/A )  Patient Location: PACU  Anesthesia Type:General  Level of Consciousness: awake, alert , oriented and patient cooperative  Airway & Oxygen Therapy: Patient Spontanous Breathing and Patient connected to face mask oxygen  Post-op Assessment: Report given to RN and Post -op Vital signs reviewed and stable  Post vital signs: Reviewed and stable  Last Vitals:  Vitals Value Taken Time  BP 120/67 03/31/2018  1:56 PM  Temp    Pulse 89 03/31/2018  1:58 PM  Resp 17 03/31/2018  1:58 PM  SpO2 95 % 03/31/2018  1:58 PM  Vitals shown include unvalidated device data.  Last Pain:  Vitals:   03/31/18 0916  TempSrc:   PainSc: 0-No pain      Patients Stated Pain Goal: 3 (68/08/81 1031)  Complications: No apparent anesthesia complications

## 2018-03-31 NOTE — H&P (Signed)
PCP: [name], MD Primary Cardiologist: [name] Primary EP: [name] is a 67 y.o. male who presents today for electrophysiology study and ablation for afib.  Since last being seen in our clinic, the patient reports doing reasonably well.  He has failed medical therapy with tikosyn.  He felt "so much better--> like a king" when in sinus.  We switched eliquis to pradaxa.  TEE revealed no thrombus however her reports very poor tolerance of pradaxa due to dyspepsia and GI upset. Today, he denies symptoms of palpitations, chest pain, shortness of breath,  lower extremity edema, dizziness, presyncope, or syncope.  The patient is otherwise without complaint today.       Past Medical History:  Diagnosis Date  . Allergy   . Anxiety    history of PTSD following CABG  . Arthritis   . Asthma   . Colitis- colonoscopy 2014 07/13/2015  . COPD (chronic obstructive pulmonary disease) ([place])   . Coronary artery disease    x 6  . Depression   . Family history of polyps in the colon   . GERD (gastroesophageal reflux disease)   . Gout   . H/O atrial fibrillation without current medication    following CABG with no documented episodes since then.  Marland Kitchen Hx of adenomatous colonic polyps 08/12/2010  . Hyperlipidemia   . Hypertension   . Prediabetes   . RLS (restless legs syndrome)   . 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 APPENDECTOMY    . COLONOSCOPY W/ BIOPSIES    . CORONARY ANGIOPLASTY WITH STENT PLACEMENT    . CORONARY ARTERY BYPASS GRAFT    . TOTAL HIP ARTHROPLASTY      ROS- all systems are reviewed and negatives except as per HPI above        Current Outpatient Medications  Medication Sig Dispense Refill  . albuterol (PROAIR HFA) 108 (90 BASE) MCG/ACT inhaler Inhale 1-2 puffs into the lungs every 6 (six) hours as needed for wheezing or shortness of breath. 8 g  5  . allopurinol (ZYLOPRIM) 300 MG tablet TAKE 1 TABLET BY MOUTH EVERY DAY 90 tablet 0  . Coenzyme Q10 (CO Q-10) 100 MG CAPS Take 2 tablets by mouth daily.    Marland Kitchen DILT-XR 120 MG 24 hr capsule Take 120 mg by mouth daily.  9  . dofetilide (TIKOSYN) 125 MCG capsule Take 1 capsule (125 mcg total) by mouth every 12 (twelve) hours. 60 capsule 2  . ELIQUIS 5 MG TABS tablet Take 5 mg by mouth 2 (two) times daily.  12  . empagliflozin (JARDIANCE) 25 MG TABS tablet Take 25 mg by mouth daily.    . fenofibrate 160 MG tablet Take 1 tablet (160 mg total) by mouth daily. 90 tablet 3  . fluticasone (FLONASE) 50 MCG/ACT nasal spray Place 1 spray into the nose daily.     . furosemide (LASIX) 20 MG tablet TAKE 1 TABLET(20 MG) BY MOUTH DAILY AS NEEDED FOR LEG SWELLING 30 tablet 0  . glucose blood (ONETOUCH VERIO) test strip Test once daily.  Dx E11.9 100 each 3  . Insulin Pen Needle (BD PEN NEEDLE NANO U/F) 32G X 4 MM MISC Use as instructed 100 each 3  . LORazepam (ATIVAN) 0.5 MG tablet Take tablet every 8 hours as needed for anxiety 90 tablet 5  . losartan (COZAAR) 100 MG tablet  Take 1 tablet (100 mg total) by mouth daily. 30 tablet 12  . metFORMIN (GLUCOPHAGE) 1000 MG tablet TAKE 1 TABLET BY MOUTH TWICE DAILY WITH A MEAL 180 tablet 0  . metoprolol (LOPRESSOR) 100 MG tablet Take 100 mg by mouth 2 (two) times daily.    . montelukast (SINGULAIR) 10 MG tablet TAKE 1 TABLET BY MOUTH AT BEDTIME 90 tablet 0  . Olopatadine HCl (PATADAY) 0.2 % SOLN Apply 1 drop to eye as needed. 2.5 mL 5  . ONETOUCH DELICA LANCETS FINE MISC 1 Device by Other route as needed. Use as instructed.  DX: 250.00 100 each 3  . pantoprazole (PROTONIX) 40 MG tablet TAKE 1 TABLET BY MOUTH TWICE DAILY 180 tablet 1  . potassium chloride SA (K-DUR,KLOR-CON) 20 MEQ tablet Take 2 tablets (40 mEq total) by mouth daily. 60 tablet 12  . pramipexole (MIRAPEX) 0.25 MG tablet Take 1 tablet (0.25 mg total) by mouth at bedtime. 90 tablet 3  .  simvastatin (ZOCOR) 80 MG tablet TAKE 1 TABLET BY MOUTH EVERY DAY 90 tablet 2  . SPIRIVA HANDIHALER 18 MCG inhalation capsule INHALE CONTENTS OF 1 CAPSULE ONCE DAILY USING HANDIHALER 90 capsule 0  . TOUJEO SOLOSTAR 300 UNIT/ML SOPN INJECT 80 UNITS UNDER THE SKIN AS DIRECTED (Patient taking differently: INJECT 70 UNITS UNDER THE SKIN AS DIRECTED in the morning) 4.5 mL 5   No current facility-administered medications for this visit.     Physical Exam:     Vitals:   03/31/18 0856  BP: 116/85  Pulse: (!) 130  Temp: 97.7 F (36.5 C)  SpO2: 95%   GEN- The patient is well appearing, alert and oriented x 3 today.   Head- normocephalic, atraumatic Eyes-  Sclera clear, conjunctiva pink Ears- hearing intact Oropharynx- clear Lungs- Clear to ausculation bilaterally, normal work of breathing Heart- irregular rate and rhythm, no murmurs, rubs or gallops, PMI not laterally displaced GI- soft, NT, ND, + BS Extremities- no clubbing, cyanosis, or edema   Assessment and Plan:  1. Persistent atrial fibrillation The patient has veryy symptomatic persistent afib He has failed medical therapy with tikosyn  Therapeutic strategies for afib including medicine and ablation were discussed in detail with the patient today. Risk, benefits, and alternatives to EP study and radiofrequency ablation for afib were also discussed in detail today. These risks include but are not limited to stroke, bleeding, vascular damage, tamponade, perforation, damage to the esophagus, lungs, and other structures, pulmonary vein stenosis, worsening renal function, and death. The patient understands these risk and wishes to proceed.  TEE reviewed with patient. Will plan to switch pradaxa to xarelto 1 week post ablation due to poor tolerance of pradaxa.  [name] MD, [facility] 03/31/2018 10:27 AM

## 2018-03-31 NOTE — Anesthesia Postprocedure Evaluation (Signed)
Anesthesia Post Note  Patient: Adam [name]  Procedure(s) Performed: ATRIAL FIBRILLATION ABLATION (N/A )     Patient location during evaluation: PACU Anesthesia Type: General Level of consciousness: sedated and patient cooperative Pain management: pain level controlled Vital Signs Assessment: post-procedure vital signs reviewed and stable Respiratory status: spontaneous breathing Cardiovascular status: stable Anesthetic complications: no    Last Vitals:  Vitals:   03/31/18 1434 03/31/18 1502  BP:  107/71  Pulse:  87  Resp:  19  Temp: (!) 36.1 C 36.6 C  SpO2:  95%    Last Pain:  Vitals:   03/31/18 1538  TempSrc:   PainSc: 0-No pain                 Nolon Nations

## 2018-03-31 NOTE — Anesthesia Procedure Notes (Signed)
Procedure Name: Intubation Date/Time: 03/31/2018 11:10 AM Performed by: Cleda Daub, CRNA Pre-anesthesia Checklist: Patient identified, Emergency Drugs available, Suction available and Patient being monitored Patient Re-evaluated:Patient Re-evaluated prior to induction Oxygen Delivery Method: Circle system utilized Preoxygenation: Pre-oxygenation with 100% oxygen Induction Type: IV induction Ventilation: Mask ventilation without difficulty and Mask ventilation throughout procedure Laryngoscope Size: Mac and 3 Grade View: Grade II Tube type: Oral Tube size: 8.0 mm Number of attempts: 1 Airway Equipment and Method: Stylet Placement Confirmation: ETT inserted through vocal cords under direct vision,  positive ETCO2 and breath sounds checked- equal and bilateral Secured at: 23 cm Tube secured with: Tape Dental Injury: Teeth and Oropharynx as per pre-operative assessment

## 2018-03-31 NOTE — Progress Notes (Signed)
Patient discharged home per MD order.  D/C instructions and follow-up appointments given to patient and his wife who both verbalized understanding.  Patient ambulated to nurse's station and back to bed without difficulty.  Right groin level 0.  Accompanied by NT to main entrance via wheelchair and discharged home with wife in personal vehicle.

## 2018-03-31 NOTE — Discharge Instructions (Signed)
Post procedure care instructions No driving for 4 days. No lifting over 5 lbs for 1 week. No vigorous or sexual activity for 1 week. You may return to work on 04/07/18. Keep procedure site clean & dry. If you notice increased pain, swelling, bleeding or pus, call/return!  You may shower, but no soaking baths/hot tubs/pools for 1 week.   You have an appointment set up with the [facility].  Multiple studies have shown that being followed by a dedicated atrial fibrillation clinic in addition to the standard care you receive from your other physicians improves health. We believe that enrollment in the atrial fibrillation clinic will allow [place] to better care for you.   The phone number to the [facility] is [phone]. The clinic is staffed Monday through Friday from 8:30am to 5pm.  Parking Directions: The clinic is located in the Heart and Vascular Building connected to [facility]. 1)From [address] turn on to [address] and go to the 3rd entrance  (Heart and Vascular entrance) on the right. 2)Look to the right for Heart &Vascular Parking Garage. 3)A code for the entrance is required please call the clinic to receive this.   4)Take the elevators to the 1st floor. Registration is in the room with the glass walls at the end of the hallway.  If you have any trouble parking or locating the clinic, please dont hesitate to call [phone].

## 2018-03-31 NOTE — Progress Notes (Signed)
Doing well s/p ablation VSS No concerns  Give Pradaxa at 1700 tonight.   Anticipate discharge to home when bedrest is complete (8:30 pm)  Continue home medicines  Follow-up in the AF clinic in 4 weeks  [name] MD, [facility] 03/31/2018 5:27 PM

## 2018-03-31 NOTE — Discharge Summary (Signed)
ELECTROPHYSIOLOGY PROCEDURE DISCHARGE SUMMARY    Patient ID: Adam [name],  MRN: [id number], DOB/AGE: 68-Jun-1952 68 y.o.  Admit date: 03/31/2018 Discharge date: 03/31/2018   Primary Cardiologist: [name] Electrophysiologist: [name], MD  Primary Discharge Diagnosis:  Persistent atrial fibrillation  Secondary Discharge Diagnosis:  1. CAD s/p CABG 2. HTN  Procedures This Admission:  1.  Electrophysiology study and radiofrequency catheter ablation on 03/31/18 by Dr [name].  This study demonstrated successful PVI with posterior wall isolation also performed.    Brief HPI: Adam [name] is a 67 y.o. male with a history of persistent atrial fibrillation.  They have failed medical therapy with tikosyn. Risks, benefits, and alternatives to catheter ablation of atrial fibrillation were reviewed with the patient who wished to proceed.  The patient underwent TEE prior to the procedure which demonstrated normal LV function and no LAA thrombus.    Hospital Course:  The patient was admitted and underwent EPS/RFCA of atrial fibrillation with details as outlined above.  They were monitored on telemetry which demonstrated sinus rhythm.  Groin was without complication on the time of discharge.  The patient was examined and considered to be stable for discharge.  Wound care and restrictions were reviewed with the patient.  The patient will be seen back by Roderic Palau, NP in 4 weeks and Dr Rayann Heman in 12 weeks for post ablation follow up.   This patients CHA2DS2-VASc Score and unadjusted Ischemic Stroke Rate (% per year) is equal to 3.2 % stroke rate/year from a score of 3 Above score calculated as 1 point each if present [CHF, HTN, DM, Vascular=MI/PAD/Aortic Plaque, Age if 65-74, or Male] Above score calculated as 2 points each if present [Age > 75, or Stroke/TIA/TE]   Physical Exam: Vitals:   03/31/18 1425 03/31/18 1430 03/31/18 1434 03/31/18 1502  BP: 114/66 114/68  107/71    Pulse: 88 87  87  Resp: 18 20  19   Temp:   (!) 97 F (36.1 C) 97.8 F (36.6 C)  TempSrc:   Temporal Oral  SpO2: 94% 94%  95%  Weight:    266 lb 4.8 oz (120.8 kg)  Height:    5\' 10"  (1.778 m)    GEN- The patient is well appearing, alert and oriented x 3 today.   HEENT: normocephalic, atraumatic; sclera clear, conjunctiva pink; hearing intact; oropharynx clear; neck supple  Lungs- Clear to ausculation bilaterally, normal work of breathing.  No wheezes, rales, rhonchi Heart- Regular rate and rhythm, no murmurs, rubs or gallops  GI- soft, non-tender, non-distended, bowel sounds present  Extremities- no clubbing, cyanosis, or edema; DP/PT/radial pulses 2+ bilaterally, groin without hematoma/bruit MS- no significant deformity or atrophy Skin- warm and dry, no rash or lesion Psych- euthymic mood, full affect Neuro- strength and sensation are intact   Labs:   Lab Results  Component Value Date   WBC 6.9 03/28/2018   HGB 16.3 03/28/2018   HCT 45.4 03/28/2018   MCV 96 03/28/2018   PLT 209 03/28/2018    Recent Labs  Lab 03/28/18 1136  NA 138  K 4.4  CL 96  CO2 23  BUN 16  CREATININE 0.93  CALCIUM 10.2  GLUCOSE 130*     Discharge Medications:  Allergies as of 03/31/2018      Reactions   Amoxicillin Rash   * SEVERE RASH IN GROIN AREA Has patient had a PCN reaction causing immediate rash, facial/tongue/throat swelling, SOB or lightheadedness with hypotension: no Has patient had  a PCN reaction causing severe rash involving mucus membranes or skin necrosis: no Has patient had a PCN reaction that required hospitalization: no Has patient had a PCN reaction occurring within the last 10 years: yes If all of the above answers are "NO", then may proceed with Cephalosporin use.   Augmentin [amoxicillin-pot Clavulanate] Rash   * SEVERE RASH IN GROIN AREA   Azithromycin Rash   * SEVERE RASH IN GROIN AREA   Clindamycin/lincomycin Rash   Keflex [cephalexin] Rash      Medication  List    TAKE these medications   acetaminophen 650 MG CR tablet Commonly known as:  TYLENOL Take 1,950 mg by mouth every 8 (eight) hours as needed for pain.   albuterol 108 (90 Base) MCG/ACT inhaler Commonly known as:  PROAIR HFA Inhale 1-2 puffs into the lungs every 6 (six) hours as needed for wheezing or shortness of breath. What changed:  how much to take   allopurinol 300 MG tablet Commonly known as:  ZYLOPRIM TAKE 1 TABLET BY MOUTH EVERY DAY   BREO ELLIPTA 100-25 MCG/INH Aepb Generic drug:  fluticasone furoate-vilanterol INHALE 1 PUFF INTO THE LUNGS DAILY   Co Q-10 100 MG Caps Take 100 mg by mouth daily.   dabigatran 150 MG Caps capsule Commonly known as:  PRADAXA Take 1 capsule (150 mg total) by mouth 2 (two) times daily.   DILT-XR 120 MG 24 hr capsule Generic drug:  diltiazem Take 120 mg by mouth daily.   dofetilide 125 MCG capsule Commonly known as:  TIKOSYN Take 1 capsule (125 mcg total) by mouth every 12 (twelve) hours.   econazole nitrate 1 % cream Apply 1 application topically every other day.   fenofibrate 160 MG tablet Take 1 tablet (160 mg total) by mouth daily.   fluticasone 50 MCG/ACT nasal spray Commonly known as:  FLONASE Place 1 spray into both nostrils daily.   furosemide 20 MG tablet Commonly known as:  LASIX TAKE 1 TABLET(20 MG) BY MOUTH DAILY AS NEEDED FOR LEG SWELLING   glucose blood test strip Commonly known as:  ONETOUCH VERIO Test once daily.  Dx E11.9   JARDIANCE 25 MG Tabs tablet Generic drug:  empagliflozin Take 25 mg by mouth daily.   LORazepam 0.5 MG tablet Commonly known as:  ATIVAN Take tablet every 8 hours as needed for anxiety What changed:    how much to take  how to take this  when to take this  reasons to take this  additional instructions   losartan-hydrochlorothiazide 100-25 MG tablet Commonly known as:  HYZAAR TAKE 1 TABLET BY MOUTH EVERY DAY   metFORMIN 1000 MG tablet Commonly known as:   GLUCOPHAGE TAKE 1 TABLET BY MOUTH TWICE DAILY WITH A MEAL   metoprolol tartrate 100 MG tablet Commonly known as:  LOPRESSOR Take 100 mg by mouth 2 (two) times daily.   montelukast 10 MG tablet Commonly known as:  SINGULAIR TAKE 1 TABLET BY MOUTH AT BEDTIME   Olopatadine HCl 0.2 % Soln Commonly known as:  PATADAY Apply 1 drop to eye as needed. What changed:    how to take this  when to take this  reasons to take this   pantoprazole 40 MG tablet Commonly known as:  PROTONIX TAKE 1 TABLET BY MOUTH TWICE DAILY What changed:    how much to take  how to take this  when to take this   potassium chloride SA 20 MEQ tablet Commonly known as:  K-DUR,KLOR-CON Take 2  tablets (40 mEq total) by mouth daily.   pramipexole 0.25 MG tablet Commonly known as:  MIRAPEX Take 1 tablet (0.25 mg total) by mouth at bedtime. What changed:    how much to take  when to take this  reasons to take this   ranitidine 150 MG capsule Commonly known as:  ZANTAC Take 150 mg by mouth every evening.   simvastatin 80 MG tablet Commonly known as:  ZOCOR TAKE 1 TABLET BY MOUTH EVERY DAY   SPIRIVA HANDIHALER 18 MCG inhalation capsule Generic drug:  tiotropium INHALE CONTENTS OF 1 CAPSULE ONCE DAILY USING HANDIHALER   TOUJEO SOLOSTAR 300 UNIT/ML Sopn Generic drug:  Insulin Glargine INJECT 80 UNITS UNDER THE SKIN AS DIRECTED What changed:  See the new instructions.       Disposition:   Follow-up Information     [facility] Follow up on 05/01/2018.   Specialty:  Cardiology Why:  9:30AM Contact information: [address] [id number] [place] [phone]       [name], MD Follow up on 06/30/2018.   Specialty:  Cardiology Why:  9:15AM Contact information: [place] [address] [phone]           Duration of Discharge Encounter: Greater than 30 minutes including physician  time.  Army Fossa MD 03/31/2018 5:31 PM

## 2018-03-31 NOTE — Anesthesia Preprocedure Evaluation (Signed)
Anesthesia Evaluation  Patient identified by MRN, date of birth, ID band Patient awake    Reviewed: Allergy & Precautions, NPO status , Patient's Chart, lab work & pertinent test results  Airway Mallampati: II  TM Distance: >3 FB Neck ROM: Full    Dental no notable dental hx.    Pulmonary asthma , COPD, former smoker,    Pulmonary exam normal breath sounds clear to auscultation       Cardiovascular Exercise Tolerance: Good hypertension, Pt. on home beta blockers + CAD  Normal cardiovascular exam+ dysrhythmias Atrial Fibrillation  Rhythm:Regular Rate:Normal  Echo 10/2017  The   estimated ejection fraction was in the range of 60% to 65%. Wall   motion was normal; there were no regional wall motion   abnormalities.   Neuro/Psych negative neurological ROS     GI/Hepatic Neg liver ROS, GERD  ,  Endo/Other  diabetes, Type 2Morbid obesity  Renal/GU negative Renal ROS     Musculoskeletal   Abdominal (+) + obese,   Peds  Hematology negative hematology ROS (+)   Anesthesia Other Findings   Reproductive/Obstetrics                             Anesthesia Physical  Anesthesia Plan  ASA: III  Anesthesia Plan: General   Post-op Pain Management:    Induction: Intravenous  PONV Risk Score and Plan: 2 and Treatment may vary due to age or medical condition, Ondansetron and Dexamethasone  Airway Management Planned: LMA  Additional Equipment:   Intra-op Plan:   Post-operative Plan: Extubation in OR  Informed Consent: I have reviewed the patients History and Physical, chart, labs and discussed the procedure including the risks, benefits and alternatives for the proposed anesthesia with the patient or authorized representative who has indicated his/her understanding and acceptance.   Dental advisory given  Plan Discussed with: CRNA  Anesthesia Plan Comments:        Anesthesia Quick  Evaluation

## 2018-04-03 ENCOUNTER — Encounter: Payer: Self-pay | Admitting: Internal Medicine

## 2018-04-03 ENCOUNTER — Encounter (HOSPITAL_COMMUNITY): Payer: Self-pay | Admitting: Internal Medicine

## 2018-04-03 HISTORY — PX: FINGER SURGERY: SHX640

## 2018-04-04 ENCOUNTER — Other Ambulatory Visit (HOSPITAL_COMMUNITY): Payer: Self-pay | Admitting: *Deleted

## 2018-04-04 ENCOUNTER — Encounter (INDEPENDENT_AMBULATORY_CARE_PROVIDER_SITE_OTHER): Payer: Self-pay

## 2018-04-04 LAB — POCT ACTIVATED CLOTTING TIME
Activated Clotting Time: 263 seconds
Activated Clotting Time: 274 seconds
Activated Clotting Time: 279 seconds

## 2018-04-04 MED ORDER — RIVAROXABAN 20 MG PO TABS: 20.0000 mg | ORAL_TABLET | Freq: Every day | ORAL | 0 refills | Status: DC

## 2018-04-10 ENCOUNTER — Encounter: Payer: Self-pay | Admitting: Internal Medicine

## 2018-04-12 ENCOUNTER — Ambulatory Visit: Payer: Medicare Other | Admitting: Internal Medicine

## 2018-04-12 ENCOUNTER — Encounter: Payer: Self-pay | Admitting: Internal Medicine

## 2018-04-12 VITALS — BP 138/72 | HR 100 | Ht 70.0 in | Wt 254.0 lb

## 2018-04-12 DIAGNOSIS — I4819 Other persistent atrial fibrillation: Secondary | ICD-10-CM

## 2018-04-12 DIAGNOSIS — I481 Persistent atrial fibrillation: Secondary | ICD-10-CM

## 2018-04-12 NOTE — Progress Notes (Signed)
PCP: Adam Post, MD Primary Cardiologist: Dr Adam [name] Primary EP: Dr Adam [name] is a 67 y.o. male who presents today for groin check [name] ablation.  He reports some discomfort in his R groin.  This is primarily along his R calf.  Worse with bending his leg. Denies symptoms of palpitations, chest pain, shortness of breath,  lower extremity edema, dizziness, presyncope, or syncope.  The patient is otherwise without complaint today.   Past Medical History:  Diagnosis Date  . Allergy   . Anxiety    history of PTSD following CABG  . Arthritis   . Asthma   . Colitis- colonoscopy 2014 07/13/2015  . COPD (chronic obstructive pulmonary disease) ([place])   . Coronary artery disease    x 6  . Depression   . Family history of polyps in the colon   . GERD (gastroesophageal reflux disease)   . Gout   . H/O atrial fibrillation without current medication    following CABG with no documented episodes since then.  Marland Kitchen Hx of adenomatous colonic polyps 08/12/2010  . Hyperlipidemia   . Hypertension   . Prediabetes   . RLS (restless legs syndrome)   . 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 APPENDECTOMY    . ATRIAL FIBRILLATION ABLATION N/A 03/31/2018   Procedure: ATRIAL FIBRILLATION ABLATION;  Surgeon: Adam Grayer, MD;  Location: [facility];  Service: Cardiovascular;  Laterality: N/A;  . COLONOSCOPY W/ BIOPSIES    . CORONARY ANGIOPLASTY WITH STENT PLACEMENT    . CORONARY ARTERY BYPASS GRAFT    . TEE WITHOUT CARDIOVERSION N/A 03/30/2018   Procedure: TRANSESOPHAGEAL ECHOCARDIOGRAM (TEE);  Surgeon: Adam Klein, MD;  Location: [facility];  Service: Cardiovascular;  Laterality: N/A;  . TOTAL HIP ARTHROPLASTY      ROS- all systems are reviewed and negatives except as per HPI above  Current Outpatient Medications  Medication Sig Dispense Refill  . acetaminophen (TYLENOL) 650 MG CR tablet Take 1,950 mg by  mouth every 8 (eight) hours as needed for pain.    Marland Kitchen albuterol (PROAIR HFA) 108 (90 BASE) MCG/ACT inhaler Inhale 1-2 puffs into the lungs every 6 (six) hours as needed for wheezing or shortness of breath. (Patient taking differently: Inhale 1 puff into the lungs every 6 (six) hours as needed for wheezing or shortness of breath. ) 8 g 5  . allopurinol (ZYLOPRIM) 300 MG tablet TAKE 1 TABLET BY MOUTH EVERY DAY 90 tablet 1  . BREO ELLIPTA 100-25 MCG/INH AEPB INHALE 1 PUFF INTO THE LUNGS DAILY 60 each 5  . Coenzyme Q10 (CO Q-10) 100 MG CAPS Take 100 mg by mouth daily.     Marland Kitchen DILT-XR 120 MG 24 hr capsule Take 120 mg by mouth daily.  9  . dofetilide (TIKOSYN) 125 MCG capsule Take 1 capsule (125 mcg total) by mouth every 12 (twelve) hours. 60 capsule 2  . econazole nitrate 1 % cream Apply 1 application topically every other day.     . empagliflozin (JARDIANCE) 25 MG TABS tablet Take 25 mg by mouth daily.    . fenofibrate 160 MG tablet Take 1 tablet (160 mg total) by mouth daily. 90 tablet 3  . fluticasone (FLONASE) 50 MCG/ACT nasal spray Place 1 spray into both nostrils daily.     . furosemide (LASIX) 20 MG tablet TAKE 1 TABLET(20 MG) BY MOUTH DAILY AS NEEDED FOR LEG SWELLING 30  tablet 0  . glucose blood (ONETOUCH VERIO) test strip Test once daily.  Dx E11.9 100 each 3  . LORazepam (ATIVAN) 0.5 MG tablet Take tablet every 8 hours as needed for anxiety (Patient taking differently: Take 0.5 mg by mouth every 8 (eight) hours as needed for anxiety. ) 90 tablet 5  . losartan-hydrochlorothiazide (HYZAAR) 100-25 MG tablet TAKE 1 TABLET BY MOUTH EVERY DAY 90 tablet 1  . metFORMIN (GLUCOPHAGE) 1000 MG tablet TAKE 1 TABLET BY MOUTH TWICE DAILY WITH A MEAL 180 tablet 1  . metoprolol (LOPRESSOR) 100 MG tablet Take 100 mg by mouth 2 (two) times daily.    . montelukast (SINGULAIR) 10 MG tablet TAKE 1 TABLET BY MOUTH AT BEDTIME 90 tablet 1  . Olopatadine HCl (PATADAY) 0.2 % SOLN Apply 1 drop to eye as needed. (Patient  taking differently: Place 1 drop into both eyes daily as needed (for dry eyes). ) 2.5 mL 5  . pantoprazole (PROTONIX) 40 MG tablet TAKE 1 TABLET BY MOUTH TWICE DAILY (Patient taking differently: TAKE 1 TABLET BY MOUTH  DAILY IN THE MORNING) 180 tablet 1  . potassium chloride SA (K-DUR,KLOR-CON) 20 MEQ tablet Take 2 tablets (40 mEq total) by mouth daily. 60 tablet 12  . pramipexole (MIRAPEX) 0.25 MG tablet Take 1 tablet (0.25 mg total) by mouth at bedtime. (Patient taking differently: Take 0.5-0.75 mg by mouth at bedtime as needed (Restless legs). ) 90 tablet 3  . ranitidine (ZANTAC) 150 MG capsule Take 150 mg by mouth every evening.    . rivaroxaban (XARELTO) 20 MG TABS tablet Take 1 tablet (20 mg total) by mouth daily with supper. 30 tablet 0  . simvastatin (ZOCOR) 80 MG tablet TAKE 1 TABLET BY MOUTH EVERY DAY 90 tablet 2  . SPIRIVA HANDIHALER 18 MCG inhalation capsule INHALE CONTENTS OF 1 CAPSULE ONCE DAILY USING HANDIHALER 90 capsule 1  . TOUJEO SOLOSTAR 300 UNIT/ML SOPN INJECT 80 UNITS UNDER THE SKIN AS DIRECTED (Patient taking differently: INJECT 70 UNITS UNDER THE SKIN AS DIRECTED in the morning) 4.5 mL 5   No current facility-administered medications for this visit.     Physical Exam: Vitals:   04/12/18 1205  Height: 5\' 10"  (1.778 m)    GEN- The patient is well appearing, alert and oriented x 3 today.   Head- normocephalic, atraumatic Eyes-  Sclera clear, conjunctiva pink Ears- hearing intact Oropharynx- clear Lungs- Clear to ausculation bilaterally, normal work of breathing Heart- Regular rate and rhythm, no murmurs, rubs or gallops, PMI not laterally displaced GI- soft, NT, ND, + BS Extremities- no clubbing, cyanosis, or edema, R groin without hematoma or bruit.  He does however have moderate ecchymosis of his R mid thigh and calf.  This is tender.  Good cap refill,  2+ DP/PT pulses  Wt Readings from Last 3 Encounters:  03/31/18 266 lb 4.8 oz (120.8 kg)  03/30/18 254 lb (115.2  kg)  03/29/18 254 lb 8 oz (115.4 kg)     Assessment and Plan:  1. R groin He has a R thigh hematoma Neurovascularly in tact Continue anticoagulation Supportive care  2. afib Maintaining sinus rhythm [name] ablation Continue anticoagulation  Return in 2 weeks   Adam Grayer MD, [facility] 04/12/2018 12:19 PM

## 2018-04-12 NOTE — Patient Instructions (Addendum)
Medication Instructions:  Your physician recommends that you continue on your current medications as directed. Please refer to the Current Medication list given to you today.  Labwork: None ordered.  Testing/Procedures: None ordered.  Follow-Up:  As previously scheduled- May 01, 2018 at 9:30 am with Roderic Palau NP at the Afib clinic.  Any Other Special Instructions Will Be Listed Below (If Applicable).  If you need a refill on your cardiac medications before your next appointment, please call your pharmacy.    Cardiac Ablation, Care After This sheet gives you information about how to care for yourself after your procedure. Your health care provider may also give you more specific instructions. If you have problems or questions, contact your health care provider. What can I expect after the procedure? After the procedure, it is common to have:  Bruising around your puncture site.  Tenderness around your puncture site.  Skipped heartbeats.  Tiredness (fatigue).  Follow these instructions at home: Puncture site care  Follow instructions from your health care provider about how to take care of your puncture site. Make sure you: ? Wash your hands with soap and water before you change your bandage (dressing). If soap and water are not available, use hand sanitizer. ? Change your dressing as told by your health care provider. ? Leave stitches (sutures), skin glue, or adhesive strips in place. These skin closures may need to stay in place for up to 2 weeks. If adhesive strip edges start to loosen and curl up, you may trim the loose edges. Do not remove adhesive strips completely unless your health care provider tells you to do that.  Check your puncture site every day for signs of infection. Check for: ? Redness, swelling, or pain. ? Fluid or blood. If your puncture site starts to bleed, lie down on your back, apply firm pressure to the area, and contact your health care  provider. ? Warmth. ? Pus or a bad smell. Driving  Ask your health care provider when it is safe for you to drive again after the procedure.  Do not drive or use heavy machinery while taking prescription pain medicine.  Do not drive for 24 hours if you were given a medicine to help you relax (sedative) during your procedure. Activity  Avoid activities that take a lot of effort for at least 3 days after your procedure.  Do not lift anything that is heavier than 10 lb (4.5 kg), or the limit that you are told, until your health care provider says that it is safe.  Return to your normal activities as told by your health care provider. Ask your health care provider what activities are safe for you. General instructions  Take over-the-counter and prescription medicines only as told by your health care provider.  Do not use any products that contain nicotine or tobacco, such as cigarettes and e-cigarettes. If you need help quitting, ask your health care provider.  Do not take baths, swim, or use a hot tub until your health care provider approves.  Do not drink alcohol for 24 hours after your procedure.  Keep all follow-up visits as told by your health care provider. This is important. Contact a health care provider if:  You have redness, mild swelling, or pain around your puncture site.  You have fluid or blood coming from your puncture site that stops after applying firm pressure to the area.  Your puncture site feels warm to the touch.  You have pus or a bad smell  coming from your puncture site.  You have a fever.  You have chest pain or discomfort that spreads to your neck, jaw, or arm.  You are sweating a lot.  You feel nauseous.  You have a fast or irregular heartbeat.  You have shortness of breath.  You are dizzy or light-headed and feel the need to lie down.  You have pain or numbness in the arm or leg closest to your puncture site. Get help right away if:  Your  puncture site suddenly swells.  Your puncture site is bleeding and the bleeding does not stop after applying firm pressure to the area. These symptoms may represent a serious problem that is an emergency. Do not wait to see if the symptoms will go away. Get medical help right away. Call your local emergency services (911 in the [place] Do not drive yourself to the hospital. Summary  After the procedure, it is normal to have bruising and tenderness at the puncture site in your groin, neck, or forearm.  Check your puncture site every day for signs of infection.  Get help right away if your puncture site is bleeding and the bleeding does not stop after applying firm pressure to the area. This is a medical emergency. This information is not intended to replace advice given to you by your health care provider. Make sure you discuss any questions you have with your health care provider. Document Released: 12/30/2016 Document Revised: 12/30/2016 Document Reviewed: 12/30/2016 Elsevier Interactive Patient Education  2018 [employer].

## 2018-04-14 ENCOUNTER — Ambulatory Visit (HOSPITAL_BASED_OUTPATIENT_CLINIC_OR_DEPARTMENT_OTHER): Payer: Medicare Other | Admitting: Cardiology

## 2018-04-17 DIAGNOSIS — M79645 Pain in left finger(s): Secondary | ICD-10-CM | POA: Diagnosis not present

## 2018-04-19 ENCOUNTER — Telehealth: Payer: Self-pay | Admitting: Internal Medicine

## 2018-04-19 NOTE — Telephone Encounter (Signed)
Will ask Dr Rayann Heman to review- pt has a history of LVT and just has RFA.  Kerin Ransom PA-C 04/19/2018 4:35 PM

## 2018-04-19 NOTE — Telephone Encounter (Signed)
   [facility] Pre-operative Risk Assessment    Request for surgical clearance:  1. What type of surgery is being performed? Left small finger closed reduction/percutaneous pinning, possible ORIF   2. When is this surgery scheduled?  July 31   3. What type of clearance is required (medical clearance vs. Pharmacy clearance to hold med vs. Both)? Both   4. Are there any medications that need to be held prior to surgery and how long? Xarelto    5. Practice name and name of physician performing surgery? [facility]   6. What is your office phone number? [phone]    7.   What is your office fax number? [phone] Attn: Santiago Bur  8.   Anesthesia type (None, local, MAC, general) ?  General    _________________________________________________________________   (provider comments below)

## 2018-04-20 NOTE — Telephone Encounter (Signed)
He may proceed with surgery. Low risk.

## 2018-04-24 NOTE — Telephone Encounter (Signed)
Reviewed with Dr. Lovena Le.  Patient may hold Xarelto for surgery. Will route to [facility] for recommendations of how long to hold Xarelto prior to surgery. Richardson Dopp, PA-C    04/24/2018 4:11 PM

## 2018-04-25 NOTE — Telephone Encounter (Signed)
   Primary Cardiologist: [name], MD  Chart reviewed as part of pre-operative protocol coverage.  Adam [name] would be at acceptable risk for the planned procedure without further cardiovascular testing.  Chart reviewed by Pharmacy.  Patient may hold Xarelto for 24 hours prior to procedure.    Call back staff:  I have faxed a letter to the requesting surgeon.  Please make sure it was received.  Note will be removed from pre-op pool.  Richardson Dopp, PA-C 04/25/2018, 2:51 PM

## 2018-04-25 NOTE — Telephone Encounter (Signed)
Patient with diagnosis of Afib on Xarelto for anticoagulation.    Procedure: small finger closed reduction/percutaneous pinning possible ORIF Date of procedure: 05/03/18  CHADS2-VASc score of  4 (CHF, HTN, AGE, DM2, stroke/tia x 2, CAD, AGE, male)  CrCl >118ml/min  Per office protocol, patient can hold Xarelto for 24 hours prior to procedure.

## 2018-04-25 NOTE — Telephone Encounter (Signed)
Pt has been advised he is cleared for his surgery scheduled for 05/03/18. Pt aware to hold Xarelto 24 hours prior, explained to the pt since his surgery is on 05/03/18, do not take any Xarelto 05/02/18, so his last dose will be on 05/01/18 and he will resume once the surgeon feels it is safe. Pt thanked me for the call. I left message for surgery scheduler clearance letter was faxed electronically today. Please call the office if letter has not been received.

## 2018-04-26 DIAGNOSIS — E119 Type 2 diabetes mellitus without complications: Secondary | ICD-10-CM | POA: Diagnosis not present

## 2018-05-01 ENCOUNTER — Encounter (HOSPITAL_COMMUNITY): Payer: Self-pay | Admitting: Nurse Practitioner

## 2018-05-01 ENCOUNTER — Ambulatory Visit: Payer: Medicare Other | Admitting: Internal Medicine

## 2018-05-01 ENCOUNTER — Ambulatory Visit (HOSPITAL_COMMUNITY)
Admission: RE | Admit: 2018-05-01 | Discharge: 2018-05-01 | Disposition: A | Discharge status: Home / Self Care | Payer: Medicare Other | Source: Ambulatory Visit | Attending: Nurse Practitioner | Admitting: Nurse Practitioner

## 2018-05-01 VITALS — BP 124/78 | HR 84 | Ht 70.0 in | Wt 256.0 lb

## 2018-05-01 DIAGNOSIS — Z955 Presence of coronary angioplasty implant and graft: Secondary | ICD-10-CM | POA: Diagnosis not present

## 2018-05-01 DIAGNOSIS — G2581 Restless legs syndrome: Secondary | ICD-10-CM | POA: Diagnosis not present

## 2018-05-01 DIAGNOSIS — I10 Essential (primary) hypertension: Secondary | ICD-10-CM | POA: Insufficient documentation

## 2018-05-01 DIAGNOSIS — Z880 Allergy status to penicillin: Secondary | ICD-10-CM | POA: Insufficient documentation

## 2018-05-01 DIAGNOSIS — Z800 Family history of malignant neoplasm of digestive organs: Secondary | ICD-10-CM | POA: Diagnosis not present

## 2018-05-01 DIAGNOSIS — I481 Persistent atrial fibrillation: Secondary | ICD-10-CM | POA: Diagnosis not present

## 2018-05-01 DIAGNOSIS — Z85828 Personal history of other malignant neoplasm of skin: Secondary | ICD-10-CM | POA: Diagnosis not present

## 2018-05-01 DIAGNOSIS — Z881 Allergy status to other antibiotic agents status: Secondary | ICD-10-CM | POA: Diagnosis not present

## 2018-05-01 DIAGNOSIS — M1990 Unspecified osteoarthritis, unspecified site: Secondary | ICD-10-CM | POA: Diagnosis not present

## 2018-05-01 DIAGNOSIS — Z7951 Long term (current) use of inhaled steroids: Secondary | ICD-10-CM | POA: Diagnosis not present

## 2018-05-01 DIAGNOSIS — Z9889 Other specified postprocedural states: Secondary | ICD-10-CM | POA: Insufficient documentation

## 2018-05-01 DIAGNOSIS — E785 Hyperlipidemia, unspecified: Secondary | ICD-10-CM | POA: Diagnosis not present

## 2018-05-01 DIAGNOSIS — Z79899 Other long term (current) drug therapy: Secondary | ICD-10-CM | POA: Diagnosis not present

## 2018-05-01 DIAGNOSIS — Z86010 Personal history of colonic polyps: Secondary | ICD-10-CM | POA: Diagnosis not present

## 2018-05-01 DIAGNOSIS — J449 Chronic obstructive pulmonary disease, unspecified: Secondary | ICD-10-CM | POA: Diagnosis not present

## 2018-05-01 DIAGNOSIS — M109 Gout, unspecified: Secondary | ICD-10-CM | POA: Insufficient documentation

## 2018-05-01 DIAGNOSIS — Z7901 Long term (current) use of anticoagulants: Secondary | ICD-10-CM | POA: Insufficient documentation

## 2018-05-01 DIAGNOSIS — I2510 Atherosclerotic heart disease of native coronary artery without angina pectoris: Secondary | ICD-10-CM | POA: Insufficient documentation

## 2018-05-01 DIAGNOSIS — F329 Major depressive disorder, single episode, unspecified: Secondary | ICD-10-CM | POA: Insufficient documentation

## 2018-05-01 DIAGNOSIS — Z87891 Personal history of nicotine dependence: Secondary | ICD-10-CM | POA: Insufficient documentation

## 2018-05-01 DIAGNOSIS — R7303 Prediabetes: Secondary | ICD-10-CM | POA: Diagnosis not present

## 2018-05-01 DIAGNOSIS — Z8371 Family history of colonic polyps: Secondary | ICD-10-CM | POA: Insufficient documentation

## 2018-05-01 DIAGNOSIS — I4819 Other persistent atrial fibrillation: Secondary | ICD-10-CM

## 2018-05-01 DIAGNOSIS — Z794 Long term (current) use of insulin: Secondary | ICD-10-CM | POA: Diagnosis not present

## 2018-05-01 DIAGNOSIS — K219 Gastro-esophageal reflux disease without esophagitis: Secondary | ICD-10-CM | POA: Insufficient documentation

## 2018-05-01 DIAGNOSIS — Z951 Presence of aortocoronary bypass graft: Secondary | ICD-10-CM | POA: Insufficient documentation

## 2018-05-01 LAB — BASIC METABOLIC PANEL
Anion gap: 16 — ABNORMAL HIGH (ref 5–15)
BUN: 11 mg/dL (ref 8–23)
CO2: 24 mmol/L (ref 22–32)
CREATININE: 0.89 mg/dL (ref 0.61–1.24)
Calcium: 10.1 mg/dL (ref 8.9–10.3)
Chloride: 97 mmol/L — ABNORMAL LOW (ref 98–111)
GFR calc Af Amer: >60 mL/min (ref >60)
GFR calc non Af Amer: >60 mL/min (ref >60)
Glucose, Bld: 148 mg/dL — ABNORMAL HIGH (ref 70–99)
Potassium: 4.2 mmol/L (ref 3.5–5.1)
Sodium: 137 mmol/L (ref 135–145)

## 2018-05-01 LAB — MAGNESIUM: Magnesium: 1.9 mg/dL (ref 1.7–2.4)

## 2018-05-01 NOTE — Procedures (Signed)
Erroneous encounter - patient cancelled appt

## 2018-05-01 NOTE — Progress Notes (Signed)
Primary Care Physician: [name], MD Referring Physician: Dr. Vinie Sill is a 67 y.o. male with a h/o persistent afib that had an ablation 6/28. He did experience a hematoma of the rt leg which has since resolved. He has not noted any afib. He feels well. No swallowing issues. He was planning to have surgery to have his left 5th finger,( fx'ed about one month ago),on 7/31, and was suppose to hold start holding xarelto tonight but it is not the usual protocol to interrupt  this close  (4 weeks out )from ablation. I have advised pt to reschedule surgery as his stroke risk is increased for up to  3 months after ablation.  Today, he denies symptoms of palpitations, chest pain, shortness of breath, orthopnea, PND, lower extremity edema, dizziness, presyncope, syncope, or neurologic sequela. The patient is tolerating medications without difficulties and is otherwise without complaint today.   Past Medical History:  Diagnosis Date  . Allergy   . Anxiety    history of PTSD following CABG  . Arthritis   . Asthma   . Colitis- colonoscopy 2014 07/13/2015  . COPD (chronic obstructive pulmonary disease) ([place])   . Coronary artery disease    x 6  . Depression   . Family history of polyps in the colon   . GERD (gastroesophageal reflux disease)   . Gout   . H/O atrial fibrillation without current medication    following CABG with no documented episodes since then.  Marland Kitchen Hx of adenomatous colonic polyps 08/12/2010  . Hyperlipidemia   . Hypertension   . Prediabetes   . RLS (restless legs syndrome)   . 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 APPENDECTOMY    . ATRIAL FIBRILLATION ABLATION N/A 03/31/2018   Procedure: ATRIAL FIBRILLATION ABLATION;  Surgeon: [name], MD;  Location: [facility];  Service: Cardiovascular;  Laterality: N/A;  . COLONOSCOPY W/ BIOPSIES    . CORONARY ANGIOPLASTY WITH STENT  PLACEMENT    . CORONARY ARTERY BYPASS GRAFT    . TEE WITHOUT CARDIOVERSION N/A 03/30/2018   Procedure: TRANSESOPHAGEAL ECHOCARDIOGRAM (TEE);  Surgeon: [name], MD;  Location: [facility];  Service: Cardiovascular;  Laterality: N/A;  . TOTAL HIP ARTHROPLASTY      Current Outpatient Medications  Medication Sig Dispense Refill  . acetaminophen (TYLENOL) 650 MG CR tablet Take 1,950 mg by mouth every 8 (eight) hours as needed for pain.    Marland Kitchen albuterol (PROAIR HFA) 108 (90 BASE) MCG/ACT inhaler Inhale 1-2 puffs into the lungs every 6 (six) hours as needed for wheezing or shortness of breath. (Patient taking differently: Inhale 1 puff into the lungs every 6 (six) hours as needed for wheezing or shortness of breath. ) 8 g 5  . allopurinol (ZYLOPRIM) 300 MG tablet TAKE 1 TABLET BY MOUTH EVERY DAY 90 tablet 1  . BREO ELLIPTA 100-25 MCG/INH AEPB INHALE 1 PUFF INTO THE LUNGS DAILY 60 each 5  . Coenzyme Q10 (CO Q-10) 100 MG CAPS Take 100 mg by mouth daily.     Marland Kitchen DILT-XR 120 MG 24 hr capsule Take 120 mg by mouth daily.  9  . dofetilide (TIKOSYN) 125 MCG capsule Take 1 capsule (125 mcg total) by mouth every 12 (twelve) hours. 60 capsule 2  . econazole nitrate 1 % cream Apply 1 application topically every other day.     . empagliflozin (JARDIANCE) 25  MG TABS tablet Take 25 mg by mouth daily.    . fenofibrate 160 MG tablet Take 1 tablet (160 mg total) by mouth daily. 90 tablet 3  . fluticasone (FLONASE) 50 MCG/ACT nasal spray Place 1 spray into both nostrils daily.     Marland Kitchen glucose blood (ONETOUCH VERIO) test strip Test once daily.  Dx E11.9 100 each 3  . LORazepam (ATIVAN) 0.5 MG tablet Take tablet every 8 hours as needed for anxiety (Patient taking differently: Take 0.5 mg by mouth every 8 (eight) hours as needed for anxiety. ) 90 tablet 5  . losartan-hydrochlorothiazide (HYZAAR) 100-25 MG tablet TAKE 1 TABLET BY MOUTH EVERY DAY 90 tablet 1  . metFORMIN (GLUCOPHAGE) 1000 MG tablet TAKE 1 TABLET BY MOUTH  TWICE DAILY WITH A MEAL 180 tablet 1  . metoprolol (LOPRESSOR) 100 MG tablet Take 100 mg by mouth 2 (two) times daily.    . montelukast (SINGULAIR) 10 MG tablet TAKE 1 TABLET BY MOUTH AT BEDTIME 90 tablet 1  . Olopatadine HCl (PATADAY) 0.2 % SOLN Apply 1 drop to eye as needed. (Patient taking differently: Place 1 drop into both eyes daily as needed (for dry eyes). ) 2.5 mL 5  . pantoprazole (PROTONIX) 40 MG tablet TAKE 1 TABLET BY MOUTH TWICE DAILY (Patient taking differently: TAKE 1 TABLET BY MOUTH  DAILY IN THE MORNING) 180 tablet 1  . potassium chloride SA (K-DUR,KLOR-CON) 20 MEQ tablet Take 2 tablets (40 mEq total) by mouth daily. 60 tablet 12  . pramipexole (MIRAPEX) 0.25 MG tablet Take 1 tablet (0.25 mg total) by mouth at bedtime. (Patient taking differently: Take 0.5-0.75 mg by mouth at bedtime as needed (Restless legs). ) 90 tablet 3  . ranitidine (ZANTAC) 150 MG capsule Take 150 mg by mouth every evening.    . rivaroxaban (XARELTO) 20 MG TABS tablet Take 1 tablet (20 mg total) by mouth daily with supper. 30 tablet 0  . simvastatin (ZOCOR) 80 MG tablet TAKE 1 TABLET BY MOUTH EVERY DAY 90 tablet 2  . SPIRIVA HANDIHALER 18 MCG inhalation capsule INHALE CONTENTS OF 1 CAPSULE ONCE DAILY USING HANDIHALER 90 capsule 1  . TOUJEO SOLOSTAR 300 UNIT/ML SOPN INJECT 80 UNITS UNDER THE SKIN AS DIRECTED (Patient taking differently: INJECT 70 UNITS UNDER THE SKIN AS DIRECTED in the morning) 4.5 mL 5  . furosemide (LASIX) 20 MG tablet TAKE 1 TABLET(20 MG) BY MOUTH DAILY AS NEEDED FOR LEG SWELLING (Patient not taking: Reported on 05/01/2018) 30 tablet 0   No current facility-administered medications for this encounter.     Allergies  Allergen Reactions  . Amoxicillin Rash    * SEVERE RASH IN GROIN AREA Has patient had a PCN reaction causing immediate rash, facial/tongue/throat swelling, SOB or lightheadedness with hypotension: no Has patient had a PCN reaction causing severe rash involving mucus  membranes or skin necrosis: no Has patient had a PCN reaction that required hospitalization: no Has patient had a PCN reaction occurring within the last 10 years: yes If all of the above answers are "NO", then may proceed with Cephalosporin use.   . Augmentin [Amoxicillin-Pot Clavulanate] Rash    * SEVERE RASH IN GROIN AREA  . Azithromycin Rash    * SEVERE RASH IN GROIN AREA  . Clindamycin/Lincomycin Rash  . Keflex [Cephalexin] Rash    Social History   Socioeconomic History  . Marital status: Married    Spouse name: Not on file  . Number of children: Not on file  . Years  of education: Not on file  . Highest education level: Not on file  Occupational History  . Occupation: retired  Scientific laboratory technician  . Financial resource strain: Not on file  . Food insecurity:    Worry: Not on file    Inability: Not on file  . Transportation needs:    Medical: Not on file    Non-medical: Not on file  Tobacco Use  . Smoking status: Former Smoker    Packs/day: 1.00    Years: 7.00    Pack years: 7.00    Types: Cigarettes    Last attempt to quit: 10/04/1977    Years since quitting: 40.6  . Smokeless tobacco: Never Used  . Tobacco comment: never smoked over 1 pack   Substance and Sexual Activity  . Alcohol use: Yes    Comment: stopped wine and now drinks 3 to 4 scotches   . Drug use: No    Comment: Smoked Marijuana back in 1970s  . Sexual activity: Yes  Lifestyle  . Physical activity:    Days per week: Not on file    Minutes per session: Not on file  . Stress: Not on file  Relationships  . Social connections:    Talks on phone: Not on file    Gets together: Not on file    Attends religious service: Not on file    Active member of club or organization: Not on file    Attends meetings of clubs or organizations: Not on file    Relationship status: Not on file  . Intimate partner violence:    Fear of current or ex partner: Not on file    Emotionally abused: Not on file    Physically  abused: Not on file    Forced sexual activity: Not on file  Other Topics Concern  . Not on file  Social History Narrative   Married, no children   Textile industry x yrs   23 off early 60's and 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 work - returned to [employer] from [place] 2014    Family History  Problem Relation Age of Onset  . Colon cancer Mother   . Heart disease Paternal Grandmother   . Heart disease Paternal Grandfather     ROS- All systems are reviewed and negative except as per the HPI above  Physical Exam: Vitals:   05/01/18 0910  BP: 124/78  Pulse: 84  Weight: 256 lb (116.1 kg)  Height: 5\' 10"  (1.778 m)   Wt Readings from Last 3 Encounters:  05/01/18 256 lb (116.1 kg)  04/12/18 254 lb (115.2 kg)  03/31/18 266 lb 4.8 oz (120.8 kg)    Labs: Lab Results  Component Value Date   NA 137 05/01/2018   K 4.2 05/01/2018   CL 97 (L) 05/01/2018   CO2 24 05/01/2018   GLUCOSE 148 (H) 05/01/2018   BUN 11 05/01/2018   CREATININE 0.89 05/01/2018   CALCIUM 10.1 05/01/2018   MG 1.9 05/01/2018   No results found for: INR Lab Results  Component Value Date   CHOL 187 06/15/2017   HDL 55.80 06/15/2017   LDLCALC 49 08/14/2013   TRIG 263.0 (H) 06/15/2017     GEN- The patient is well appearing, alert and oriented x 3 today.   Head- normocephalic, atraumatic Eyes-  Sclera clear, conjunctiva pink Ears- hearing intact Oropharynx- clear Neck- supple, no JVP Lymph- no cervical lymphadenopathy Lungs- Clear to ausculation bilaterally, normal work of breathing Heart- Regular rate and rhythm, no murmurs, rubs or  gallops, PMI not laterally displaced GI- soft, NT, ND, + BS Extremities- no clubbing, cyanosis, or edema MS- no significant deformity or atrophy Skin- no rash or lesion Psych- euthymic mood, full affect Neuro- strength and sensation are intact  EKG- NSR,wtih SR at 84 bpm, EKG automated reading states ST elevation, consider acute infarct, Does not fit with clinical picture, OLD MI,  ekg unchanged when compared to previous    Assessment and Plan: 1. Afib  S/p ablation Doing well in SR Continue Tikosyn, BB, no change Bmet/mag today  2. CHA2DS2VASc score  of least 3 Continue xarleto 20 mg daily Advised pt that he cannot interrupt anticoagulin for the 3 months following ablation This means that he will not be able to stop xarelto for the 2 days required to set an old fx  of left 5th finger and will have to reschedule the 7/31 surgery  3. HTN  Stable  F/u with Dr. Rayann Heman 9/27 afib clinic as needed   Butch Penny C. Aairah Negrette, [facility] [address] [place], [place] [address] [phone]

## 2018-05-03 ENCOUNTER — Other Ambulatory Visit: Payer: Self-pay | Admitting: Family Medicine

## 2018-05-03 DIAGNOSIS — S63297A Dislocation of distal interphalangeal joint of left little finger, initial encounter: Secondary | ICD-10-CM | POA: Diagnosis not present

## 2018-05-05 ENCOUNTER — Ambulatory Visit (HOSPITAL_BASED_OUTPATIENT_CLINIC_OR_DEPARTMENT_OTHER): Payer: Medicare Other | Attending: Cardiology | Admitting: Cardiology

## 2018-05-05 DIAGNOSIS — K210 Gastro-esophageal reflux disease with esophagitis: Secondary | ICD-10-CM | POA: Diagnosis not present

## 2018-05-05 DIAGNOSIS — G4733 Obstructive sleep apnea (adult) (pediatric): Secondary | ICD-10-CM | POA: Insufficient documentation

## 2018-05-05 DIAGNOSIS — R0683 Snoring: Secondary | ICD-10-CM | POA: Diagnosis not present

## 2018-05-05 DIAGNOSIS — R0902 Hypoxemia: Secondary | ICD-10-CM | POA: Diagnosis not present

## 2018-05-09 ENCOUNTER — Other Ambulatory Visit: Payer: Self-pay | Admitting: Family Medicine

## 2018-05-10 NOTE — Procedures (Signed)
   Patient Name: Adam [name], [place] Date: 05/05/2018 Gender: Male D.O.B: 03/19/1951 Age (years): 38 Referring Provider: [name] Height (inches): 64 Interpreting Physician: Fransico Him MD, ABSM Weight (lbs): 260 RPSGT: [name] BMI: 37 MRN: [id number] Neck Size: 18.50  CLINICAL INFORMATION  Sleep Study Type: HST  Indication for sleep study: Snoring  Epworth Sleepiness Score: 9  Most recent polysomnogram dated 02/11/2018 revealed an AHI of 2.9/h and RDI of 7.3/h.  SLEEP STUDY TECHNIQUE  A multi-channel overnight portable sleep study was performed. The channels recorded were: nasal airflow, thoracic respiratory movement, and oxygen saturation with a pulse oximetry. Snoring was also monitored.  MEDICATIONS  Patient self administered medications include: N/A.  SLEEP ARCHITECTURE  Patient was studied for 465.2 minutes. The sleep efficiency was 94.5 % and the patient was supine for 31.6%. The arousal index was 0.0 per hour.  RESPIRATORY PARAMETERS  The overall AHI was 9.9 per hour, with a central apnea index of 0.0 per hour. The oxygen nadir was 76% during sleep.  CARDIAC DATA  Mean heart rate during sleep was 90.1 bpm. IMPRESSIONS  - Mild obstructive sleep apnea occurred during this study (AHI = 9.9/h). - No significant central sleep apnea occurred during this study (CAI = 0.0/h). - Oxygen desaturation was noted during this study (Min O2 = 76%). - Patient snored 6.8% during the sleep.  DIAGNOSIS  - Obstructive Sleep Apnea (327.23 [G47.33 ICD-10]) - Nocturnal Hypoxemia (327.26 [G47.36 ICD-10])  RECOMMENDATIONS  - Therapeutic CPAP titration to determine optimal pressure required to alleviate sleep disordered breathing.  Given severity of nocturnal hypoxemia with >90% of sleep time spent with O2 sats < 89%, recommend in lab study.  - Positional therapy avoiding supine position during sleep. - Avoid alcohol, sedatives and other CNS depressants that may worsen  sleep apnea and disrupt normal sleep architecture. - Sleep hygiene should be reviewed to assess factors that may improve sleep quality. - Weight management and regular exercise should be initiated or continued.  [Electronically signed] 05/10/2018 01:36 PM  Fransico Him MD, ABSM Diplomate, American Board of Sleep Medicine

## 2018-05-12 DIAGNOSIS — S63297D Dislocation of distal interphalangeal joint of left little finger, subsequent encounter: Secondary | ICD-10-CM | POA: Diagnosis not present

## 2018-05-12 DIAGNOSIS — Z4789 Encounter for other orthopedic aftercare: Secondary | ICD-10-CM | POA: Diagnosis not present

## 2018-05-15 ENCOUNTER — Telehealth: Payer: Self-pay | Admitting: *Deleted

## 2018-05-15 ENCOUNTER — Other Ambulatory Visit (HOSPITAL_COMMUNITY): Payer: Self-pay | Admitting: Nurse Practitioner

## 2018-05-15 DIAGNOSIS — G4730 Sleep apnea, unspecified: Secondary | ICD-10-CM

## 2018-05-15 NOTE — Telephone Encounter (Signed)
-----   Message from Adam Margarita, MD sent at 05/10/2018  1:39 PM EDT ----- Please let patient know that they have sleep apnea with significant nocturnal hypoxemia > 90% of study and recommend in lab CPAP titration. Please set up titration in the sleep lab.

## 2018-05-19 DIAGNOSIS — M545 Low back pain, unspecified: Secondary | ICD-10-CM | POA: Insufficient documentation

## 2018-05-19 DIAGNOSIS — M25551 Pain in right hip: Secondary | ICD-10-CM | POA: Diagnosis not present

## 2018-05-19 NOTE — Telephone Encounter (Signed)
Titration ordered.

## 2018-05-19 NOTE — Telephone Encounter (Signed)
Informed patient of sleep study results and patient understanding was verbalized. Patient understands his sleep study showed that they have sleep apnea with significant nocturnal hypoxemia > 90% of study and recommend in lab CPAP titration.

## 2018-05-23 NOTE — Telephone Encounter (Addendum)
Patient is scheduled for Titration study on 07/01/18. Patient understands his Titration study will be done at [facility] sleep lab. Patient understands he will receive a sleep packet in a week or so. Patient understands to call if he does not receive the sleep packet in a timely manner. Patient agrees with treatment and thanked me for call.

## 2018-05-29 ENCOUNTER — Encounter: Payer: Self-pay | Admitting: *Deleted

## 2018-06-02 DIAGNOSIS — S63297D Dislocation of distal interphalangeal joint of left little finger, subsequent encounter: Secondary | ICD-10-CM | POA: Diagnosis not present

## 2018-06-02 DIAGNOSIS — Z4789 Encounter for other orthopedic aftercare: Secondary | ICD-10-CM | POA: Diagnosis not present

## 2018-06-06 DIAGNOSIS — M25551 Pain in right hip: Secondary | ICD-10-CM | POA: Diagnosis not present

## 2018-06-08 ENCOUNTER — Ambulatory Visit (HOSPITAL_BASED_OUTPATIENT_CLINIC_OR_DEPARTMENT_OTHER): Payer: Medicare Other | Attending: Cardiology | Admitting: Cardiology

## 2018-06-08 VITALS — Ht 70.0 in | Wt 260.0 lb

## 2018-06-08 DIAGNOSIS — G4733 Obstructive sleep apnea (adult) (pediatric): Secondary | ICD-10-CM | POA: Diagnosis not present

## 2018-06-08 DIAGNOSIS — G4730 Sleep apnea, unspecified: Secondary | ICD-10-CM | POA: Diagnosis present

## 2018-06-08 DIAGNOSIS — Z9989 Dependence on other enabling machines and devices: Secondary | ICD-10-CM

## 2018-06-09 ENCOUNTER — Ambulatory Visit: Payer: Medicare Other

## 2018-06-10 NOTE — Procedures (Signed)
   Patient Name: Edna, [place] Date: 06/08/2018 Gender: Male D.O.B: [birth year] Age (years): 33 Referring Provider: [name] Height (inches): 53 Interpreting Physician: Fransico Him MD, ABSM Weight (lbs): 260 RPSGT: Lanae Boast BMI: 37 MRN: [id number] Neck Size: 18.50  CLINICAL INFORMATION The patient is referred for a CPAP titration to treat sleep apnea.  SLEEP STUDY TECHNIQUE As per the AASM Manual for the Scoring of Sleep and Associated Events v2.3 (April 2016) with a hypopnea requiring 4% desaturations.  The channels recorded and monitored were frontal, central and occipital EEG, electrooculogram (EOG), submentalis EMG (chin), nasal and oral airflow, thoracic and abdominal wall motion, anterior tibialis EMG, snore microphone, electrocardiogram, and pulse oximetry. Continuous positive airway pressure (CPAP) was initiated at the beginning of the study and titrated to treat sleep-disordered breathing.  MEDICATIONS Medications self-administered by patient taken the night of the study : N/A  TECHNICIAN COMMENTS Comments added by technician: Patient was restless all through the night. Comments added by scorer: N/A  RESPIRATORY PARAMETERS Optimal PAP Pressure (cm): 16  AHI at Optimal Pressure (/hr):0.0 Overall Minimal O2 (%):86.0  Supine % at Optimal Pressure (%): 3 Minimal O2 at Optimal Pressure (%): 89.0   SLEEP ARCHITECTURE The study was initiated at 10:24:43 PM and ended at 4:47:48 AM.  Sleep onset time was 30.8 minutes and the sleep efficiency was 67.5%%. The total sleep time was 258.5 minutes.  The patient spent 9.1%% of the night in stage N1 sleep, 65.0%% in stage N2 sleep, 0.0%% in stage N3 and 25.9% in REM.Stage REM latency was 99.0 minutes  Wake after sleep onset was 93.7. Alpha intrusion was absent. Supine sleep was 26.23%.  CARDIAC DATA The 2 lead EKG demonstrated sinus rhythm. The mean heart rate was 87.4 beats per minute. Other EKG  findings include: PVCs.  LEG MOVEMENT DATA The total Periodic Limb Movements of Sleep (PLMS) were 0. The PLMS index was 0.0. A PLMS index of <15 is considered normal in adults.  IMPRESSIONS - The optimal PAP pressure was 16 cm of water. - Central sleep apnea was not noted during this titration (CAI = 0.0/h). - Moderate oxygen desaturations were observed during this titration (min O2 = 86.0%). - The patient snored with moderate snoring volume during this titration study. - 2-lead EKG demonstrated: PVCs - Clinically significant periodic limb movements were not noted during this study. Arousals associated with PLMs were rare.  DIAGNOSIS - Obstructive Sleep Apnea (327.23 [G47.33 ICD-10])  RECOMMENDATIONS - Trial of CPAP therapy on 16 cm H2O with a Medium size Philips Respironics Full Face Mask Dreamwear mask and heated humidification. - Avoid alcohol, sedatives and other CNS depressants that may worsen sleep apnea and disrupt normal sleep architecture. - Sleep hygiene should be reviewed to assess factors that may improve sleep quality. - Weight management and regular exercise should be initiated or continued. - Return to Sleep Center for re-evaluation after 10 weeks of therapy  [Electronically signed] 06/10/2018 07:24 PM  Fransico Him MD, ABSM Diplomate, American Board of Sleep Medicine

## 2018-06-12 DIAGNOSIS — M1611 Unilateral primary osteoarthritis, right hip: Secondary | ICD-10-CM | POA: Diagnosis not present

## 2018-06-12 DIAGNOSIS — M25551 Pain in right hip: Secondary | ICD-10-CM | POA: Diagnosis not present

## 2018-06-12 DIAGNOSIS — Z4789 Encounter for other orthopedic aftercare: Secondary | ICD-10-CM | POA: Diagnosis not present

## 2018-06-12 DIAGNOSIS — S63297D Dislocation of distal interphalangeal joint of left little finger, subsequent encounter: Secondary | ICD-10-CM | POA: Diagnosis not present

## 2018-06-16 ENCOUNTER — Other Ambulatory Visit: Payer: Self-pay | Admitting: Family Medicine

## 2018-06-16 ENCOUNTER — Telehealth: Payer: Self-pay | Admitting: *Deleted

## 2018-06-16 NOTE — Telephone Encounter (Signed)
Called results lmtcb. 

## 2018-06-16 NOTE — Telephone Encounter (Signed)
-----   Message from Sueanne Margarita, MD sent at 06/10/2018  7:27 PM EDT ----- Please let patient know that they had a successful PAP titration and let DME know that orders are in EPIC.  Please set up 10 week OV with me.

## 2018-06-20 DIAGNOSIS — Z4789 Encounter for other orthopedic aftercare: Secondary | ICD-10-CM | POA: Diagnosis not present

## 2018-06-20 DIAGNOSIS — S63297D Dislocation of distal interphalangeal joint of left little finger, subsequent encounter: Secondary | ICD-10-CM | POA: Diagnosis not present

## 2018-06-21 ENCOUNTER — Other Ambulatory Visit: Payer: Self-pay | Admitting: Family Medicine

## 2018-06-22 NOTE — Telephone Encounter (Addendum)
I have recommended that he give CPAP a try.  He liked the Respironics Dreamware mask the most so we will contact the DME to get him set up after his trip to [place].  We did talk briefly about the Inspire device if he could not tolerate the PAP.    Informed patient of sleep study results and patient understanding was verbalized. Patient understands his sleep study showed they had a successful PAP titration and cpap orders are in Epic. Upon patient request DME selection is [facility]. Patient understands he will be contacted by [facility] to set up his cpap. Patient understands to call if [facility] does not contact him with new setup in a timely manner. Patient understands they will be called once confirmation has been received from [facility] that they have received their new machine to schedule 10 week follow up appointment.  [facility] notified of new cpap order  Please add to airview Patient was grateful for the call and thanked me.

## 2018-06-23 ENCOUNTER — Ambulatory Visit: Payer: Medicare Other | Admitting: Family Medicine

## 2018-06-23 ENCOUNTER — Encounter: Payer: Self-pay | Admitting: Family Medicine

## 2018-06-23 ENCOUNTER — Other Ambulatory Visit: Payer: Self-pay

## 2018-06-23 VITALS — BP 118/70 | HR 93 | Temp 98.3°F | Ht 70.0 in | Wt 263.7 lb

## 2018-06-23 DIAGNOSIS — F419 Anxiety disorder, unspecified: Secondary | ICD-10-CM | POA: Diagnosis not present

## 2018-06-23 DIAGNOSIS — Z23 Encounter for immunization: Secondary | ICD-10-CM

## 2018-06-23 DIAGNOSIS — I10 Essential (primary) hypertension: Secondary | ICD-10-CM

## 2018-06-23 DIAGNOSIS — E119 Type 2 diabetes mellitus without complications: Secondary | ICD-10-CM | POA: Diagnosis not present

## 2018-06-23 DIAGNOSIS — E785 Hyperlipidemia, unspecified: Secondary | ICD-10-CM

## 2018-06-23 LAB — POCT GLYCOSYLATED HEMOGLOBIN (HGB A1C): Hemoglobin A1C: 6.1 % — AB (ref 4.0–5.6)

## 2018-06-23 LAB — HEPATIC FUNCTION PANEL
ALBUMIN: 4.4 g/dL (ref 3.5–5.2)
ALK PHOS: 30 U/L — AB (ref 39–117)
ALT: 26 U/L (ref 0–53)
AST: 29 U/L (ref 0–37)
Bilirubin, Direct: 0.2 mg/dL (ref 0.0–0.3)
TOTAL PROTEIN: 7.2 g/dL (ref 6.0–8.3)
Total Bilirubin: 0.7 mg/dL (ref 0.2–1.2)

## 2018-06-23 LAB — LIPID PANEL
CHOL/HDL RATIO: 5
CHOLESTEROL: 199 mg/dL (ref 0–200)
HDL: 39.8 mg/dL (ref >39.00)
NonHDL: 159.07
TRIGLYCERIDES: 362 mg/dL — AB (ref 0.0–149.0)
VLDL: 72.4 mg/dL — ABNORMAL HIGH (ref 0.0–40.0)

## 2018-06-23 LAB — LDL CHOLESTEROL, DIRECT: Direct LDL: 124 mg/dL

## 2018-06-23 MED ORDER — LORAZEPAM 0.5 MG PO TABS: 0.5000 mg | ORAL_TABLET | Freq: Three times a day (TID) | ORAL | 5 refills | Status: DC | PRN

## 2018-06-23 NOTE — Progress Notes (Signed)
Subjective:     Patient ID: Adam [name], male   DOB: 12/02/1950, 67 y.o.   MRN: [id number]  HPI Follow-up type 2 diabetes. Patient has done extremely well since starting Jardiance. He has been able to reduce his insulin down to 60 units once daily. No recent hypoglycemia. Last A1c 6.7%. No polyuria or polydipsia.  He has history of atrial fibrillation and had ablation procedure this past year. Has felt much better since then. Denies any recent chest pains or dizziness. He has hypertension and blood pressures been stable. No orthostatic symptoms.  Medications reviewed.    He had fall out of bed which he attributes to restless leg syndrome. He had complicated fracture left fifth digit and had to have a pin placed. That is slowly healing.  He has chronic anxiety and has been for several years on Lorazepam.  We have tried to taper him back.    Dyslipidemia on Simvastatin.   Needs follow up labs.    Past Medical History:  Diagnosis Date  . Allergy   . Anxiety    history of PTSD following CABG  . Arthritis   . Asthma   . Colitis- colonoscopy 2014 07/13/2015  . COPD (chronic obstructive pulmonary disease) ([place])   . Coronary artery disease    x 6  . Depression   . Family history of polyps in the colon   . GERD (gastroesophageal reflux disease)   . Gout   . H/O atrial fibrillation without current medication    following CABG with no documented episodes since then.  Marland Kitchen Hx of adenomatous colonic polyps 08/12/2010  . Hyperlipidemia   . Hypertension   . Prediabetes   . RLS (restless legs syndrome)   . 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 APPENDECTOMY    . ATRIAL FIBRILLATION ABLATION N/A 03/31/2018   Procedure: ATRIAL FIBRILLATION ABLATION;  Surgeon: [name], MD;  Location: [facility];  Service: Cardiovascular;  Laterality: N/A;  . COLONOSCOPY W/ BIOPSIES    . CORONARY ANGIOPLASTY WITH STENT PLACEMENT     . CORONARY ARTERY BYPASS GRAFT    . FINGER SURGERY  04/2018   Small finger left hand  . TEE WITHOUT CARDIOVERSION N/A 03/30/2018   Procedure: TRANSESOPHAGEAL ECHOCARDIOGRAM (TEE);  Surgeon: [name], MD;  Location: [facility];  Service: Cardiovascular;  Laterality: N/A;  . TOTAL HIP ARTHROPLASTY      reports that he quit smoking about 40 years ago. His smoking use included cigarettes. He has a 7.00 pack-year smoking history. He has never used smokeless tobacco. He reports that he drinks alcohol. He reports that he does not use drugs. family history includes Colon cancer in his mother; Heart disease in his paternal grandfather and paternal grandmother. Allergies  Allergen Reactions  . Amoxicillin Rash    * SEVERE RASH IN GROIN AREA Has patient had a PCN reaction causing immediate rash, facial/tongue/throat swelling, SOB or lightheadedness with hypotension: no Has patient had a PCN reaction causing severe rash involving mucus membranes or skin necrosis: no Has patient had a PCN reaction that required hospitalization: no Has patient had a PCN reaction occurring within the last 10 years: yes If all of the above answers are "NO", then may proceed with Cephalosporin use.   . Augmentin [Amoxicillin-Pot Clavulanate] Rash    * SEVERE RASH IN GROIN AREA  . Azithromycin Rash    * SEVERE RASH IN GROIN AREA  .  Clindamycin/Lincomycin Rash  . Keflex [Cephalexin] Rash     Review of Systems  Constitutional: Negative for fatigue.  Eyes: Negative for visual disturbance.  Respiratory: Negative for cough, chest tightness and shortness of breath.   Cardiovascular: Negative for chest pain, palpitations and leg swelling.  Endocrine: Negative for polydipsia and polyuria.  Neurological: Negative for dizziness, syncope, weakness, light-headedness and headaches.       Objective:   Physical Exam  Constitutional: He is oriented to person, place, and time. He appears well-developed and  well-nourished.  HENT:  Right Ear: External ear normal.  Left Ear: External ear normal.  Mouth/Throat: Oropharynx is clear and moist.  Eyes: Pupils are equal, round, and reactive to light.  Neck: Neck supple. No thyromegaly present.  Cardiovascular: Normal rate and regular rhythm.  Pulmonary/Chest: Effort normal and breath sounds normal. No respiratory distress. He has no wheezes. He has no rales.  Musculoskeletal: He exhibits no edema.  Neurological: He is alert and oriented to person, place, and time.       Assessment:     #1 type 2 diabetes well controlled A1c 6.1%  #2 hypertension stable and at goal  #3 history of chronic anxiety.  #4 dyslipidemia.    Plan:     -flu vaccine given -A1c today 6.1%. Continue current medication regimen.May consider scaling back insulin even further if A1c's remain well controlled. We'll plan routine follow-up in 6 months. -refilled Lorazepam for 6 months.  [name] MD [facility]

## 2018-06-25 ENCOUNTER — Encounter (HOSPITAL_BASED_OUTPATIENT_CLINIC_OR_DEPARTMENT_OTHER): Payer: Medicare Other

## 2018-06-25 NOTE — Progress Notes (Addendum)
Subjective:   Adam [name] is a 67 y.o. male who presents for Medicare Annual/Subsequent preventive examination.  Reports health as fair due to OA  Last OV 06/23/2018  With Dr. Elease Hashimoto  Hx of atrial fib and ablation last year (found clot in heart prior to an attempted ablation  [place] out of bed due to RLS; fx 5th digit on left hand  Pin came out x 10 days ago   Diet Chol/hdl 5; trig 362; hdl 39  Noted success on Jardiance Chol/hdl 5; trig 362; hdl 39;  A1c 6.1  BMI 37      Exercise Did the treadmill bec of his MI 94 and 95 45 minutes daily on incline of 8 Excellent collateral circulation per his heart md   Left hip replaced in 2013 in Dec  now the right hip is hurting x 6 months Just had MRI with contrast  Next week will take his first injection to his hip  Has a recumbent bike, may try this but will check with Ortho prior to attempting exercise   Health Maintenance Due  Topic Date Due  . FOOT EXAM  06/25/2017   Just had office exam in [place] exam completed and WNL today Toenails good cap refill; sensation good, no calluses   09/04/2017 no retinopathy  Dr. Raquel James - has apt later this year   Hearing wnl; no issue with conversation  Colonoscopy 11/2015 - will repeat prior to his per his report q 3 years Changed the metric from 5 to 3 at his request due to family hx of cancer. He understands he needs to contact GI for follow up   PSA 9/29   Cardiac Risk Factors include: advanced age (>62mn, >>55women);diabetes mellitus;dyslipidemia;family history of premature cardiovascular disease;hypertension;male gender;obesity (BMI >30kg/m2)     Objective:    Vitals: Ht '5\' 10"'  (1.778 m)   Wt 263 lb (119.3 kg)   BMI 37.74 kg/m   Body mass index is 37.74 kg/m.  BP running in normal ranges and check 06/26/2018  Advanced Directives 06/26/2018 06/08/2018 03/31/2018 03/30/2018 02/11/2018 01/31/2018 12/14/2017  Does Patient Have a Medical Advance Directive? Yes Yes  Yes Yes Yes Yes Yes  Type of Advance Directive - Living will [place] Living [place]  Does patient want to make changes to medical advance directive? - No - Patient declined No - Patient declined - No - Patient declined - No - Patient declined  Copy of Healthcare Power of Attorney in Chart? - - No - copy requested No - copy requested No - copy requested No - copy requested No - copy requested    Tobacco Social History   Tobacco Use  Smoking Status Former Smoker  . Packs/day: 1.00  . Years: 7.00  . Pack years: 7.00  . Types: Cigarettes  . Last attempt to quit: 10/04/1977  . Years since quitting: 40.7  Smokeless Tobacco Never Used  Tobacco Comment   never smoked over 1 pack      Counseling given: Yes Comment: never smoked over 1 pack    Clinical Intake:   Past Medical History:  Diagnosis Date  . Allergy   . Anxiety    history of PTSD following CABG  . Arthritis   . Asthma   . Colitis- colonoscopy 2014 07/13/2015  . COPD [place] obstructive [place] disease[place]   . Coronary artery disease  x 6  . Depression   . Family history of polyps in the colon   . GERD (gastroesophageal reflux disease)   . Gout   . H/O atrial fibrillation without current medication    following CABG with no documented episodes since then.  Marland Kitchen Hx of adenomatous colonic polyps 08/12/2010  . Hyperlipidemia   . Hypertension   . Prediabetes   . RLS (restless legs syndrome)   . 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 APPENDECTOMY    . ATRIAL FIBRILLATION ABLATION N/A 03/31/2018   Procedure: ATRIAL FIBRILLATION ABLATION;  Surgeon: [name], MD;  Location: [facility];  Service: Cardiovascular;  Laterality: N/A;  . COLONOSCOPY W/ BIOPSIES    . CORONARY ANGIOPLASTY WITH STENT  PLACEMENT    . CORONARY ARTERY BYPASS GRAFT    . FINGER SURGERY  04/2018   Small finger left hand  . TEE WITHOUT CARDIOVERSION N/A 03/30/2018   Procedure: TRANSESOPHAGEAL ECHOCARDIOGRAM (TEE);  Surgeon: [name], MD;  Location: [facility];  Service: Cardiovascular;  Laterality: N/A;  . TOTAL HIP ARTHROPLASTY     Family History  Problem Relation Age of Onset  . Colon cancer Mother   . Heart disease Paternal Grandmother   . Heart disease Paternal Grandfather    Social History   Socioeconomic History  . Marital status: Married    Spouse name: Not on file  . Number of children: Not on file  . Years of education: Not on file  . Highest education level: Not on file  Occupational History  . Occupation: retired  Scientific laboratory technician  . Financial resource strain: Not on file  . Food insecurity:    Worry: Not on file    Inability: Not on file  . Transportation needs:    Medical: Not on file    Non-medical: Not on file  Tobacco Use  . Smoking status: Former Smoker    Packs/day: 1.00    Years: 7.00    Pack years: 7.00    Types: Cigarettes    Last attempt to quit: 10/04/1977    Years since quitting: 40.7  . Smokeless tobacco: Never Used  . Tobacco comment: never smoked over 1 pack   Substance and Sexual Activity  . Alcohol use: Yes    Comment: stopped wine and now drinks 3 to 4 scotches   . Drug use: No    Comment: Smoked Marijuana back in 1970s  . Sexual activity: Yes  Lifestyle  . Physical activity:    Days per week: Not on file    Minutes per session: Not on file  . Stress: Not on file  Relationships  . Social connections:    Talks on phone: Not on file    Gets together: Not on file    Attends religious service: Not on file    Active member of club or organization: Not on file    Attends meetings of clubs or organizations: Not on file    Relationship status: Not on file  Other Topics Concern  . Not on file  Social History Narrative   Married, no children   Textile  industry x yrs   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 work - returned to [employer] from [place] 2014    Outpatient Encounter Medications as of 06/26/2018  Medication Sig  . acetaminophen (TYLENOL) 650 MG CR tablet Take 1,950 mg by mouth every 8 (eight) hours as  needed for pain.  Marland Kitchen albuterol (PROAIR HFA) 108 (90 BASE) MCG/ACT inhaler Inhale 1-2 puffs into the lungs every 6 (six) hours as needed for wheezing or shortness of breath. (Patient taking differently: Inhale 1 puff into the lungs every 6 (six) hours as needed for wheezing or shortness of breath. )  . allopurinol (ZYLOPRIM) 300 MG tablet TAKE 1 TABLET BY MOUTH EVERY DAY  . BREO ELLIPTA 100-25 MCG/INH AEPB INHALE 1 PUFF INTO THE LUNGS DAILY  . Coenzyme Q10 (CO Q-10) 100 MG CAPS Take 100 mg by mouth daily.   Marland Kitchen DILT-XR 120 MG 24 hr capsule Take 120 mg by mouth daily.  Marland Kitchen dofetilide (TIKOSYN) 125 MCG capsule Take 1 capsule (125 mcg total) by mouth every 12 (twelve) hours.  Marland Kitchen econazole nitrate 1 % cream Apply 1 application topically every other day.   . empagliflozin (JARDIANCE) 25 MG TABS tablet Take 25 mg by mouth daily.  . fenofibrate 160 MG tablet TAKE 1 TABLET(160 MG) BY MOUTH DAILY  . fluticasone (FLONASE) 50 MCG/ACT nasal spray Place 1 spray into both nostrils daily.   . furosemide (LASIX) 20 MG tablet TAKE 1 TABLET(20 MG) BY MOUTH DAILY AS NEEDED FOR LEG SWELLING  . glucose blood (ONETOUCH VERIO) test strip Test once daily.  Dx E11.9  . ketoconazole (NIZORAL) 2 % cream ketoconazole 2 % topical cream  APPLY NIGHTLY  . LORazepam (ATIVAN) 0.5 MG tablet Take 1 tablet (0.5 mg total) by mouth every 8 (eight) hours as needed for anxiety.  Marland Kitchen losartan-hydrochlorothiazide (HYZAAR) 100-25 MG tablet TAKE 1 TABLET BY MOUTH EVERY DAY  . metFORMIN (GLUCOPHAGE) 1000 MG tablet TAKE 1 TABLET BY MOUTH TWICE DAILY WITH A MEAL  . metoprolol (LOPRESSOR) 100 MG tablet Take 100 mg by mouth 2 (two) times daily.  . montelukast (SINGULAIR) 10 MG tablet TAKE 1  TABLET BY MOUTH AT BEDTIME  . Olopatadine HCl (PATADAY) 0.2 % SOLN Apply 1 drop to eye as needed. (Patient taking differently: Place 1 drop into both eyes daily as needed (for dry eyes). )  . pantoprazole (PROTONIX) 40 MG tablet TAKE 1 TABLET BY MOUTH TWICE DAILY (Patient taking differently: TAKE 1 TABLET BY MOUTH  DAILY IN THE MORNING)  . potassium chloride SA (K-DUR,KLOR-CON) 20 MEQ tablet Take 2 tablets (40 mEq total) by mouth daily.  . pramipexole (MIRAPEX) 0.25 MG tablet TAKE 1 TABLET(0.25 MG) BY MOUTH AT BEDTIME  . ranitidine (ZANTAC) 150 MG capsule Take 150 mg by mouth every evening.  . simvastatin (ZOCOR) 80 MG tablet TAKE 1 TABLET BY MOUTH EVERY DAY  . SPIRIVA HANDIHALER 18 MCG inhalation capsule INHALE CONTENTS OF 1 CAPSULE ONCE DAILY USING HANDIHALER  . TOUJEO SOLOSTAR 300 UNIT/ML SOPN INJECT 80 UNITS INTO THE SKIN AS DIRECTED (Patient taking differently: 60 Units. )  . XARELTO 20 MG TABS tablet TAKE 1 TABLET BY MOUTH DAILY WITH SUPPER  . Zoster Vaccine Adjuvanted [facility]) injection Shingrix (PF) 50 mcg/0.5 mL intramuscular suspension, kit  ADM 0.5ML IM UTD   No facility-administered encounter medications on file as of 06/26/2018.     Activities of Daily Living In your present state of health, do you have any difficulty performing the following activities: 06/26/2018 12/14/2017  Hearing? N N  Vision? N N  Difficulty concentrating or making decisions? N N  Walking or climbing stairs? Y N  Comment having some hip issues  -  Dressing or bathing? N N  Doing errands, shopping? N N  Preparing Food and eating ? N -  Using the Toilet? N -  In the past six months, have you accidently leaked urine? N -  Do you have problems with loss of bowel control? N -  Managing your Medications? N -  Managing your Finances? N -  Housekeeping or managing your Housekeeping? N -  Some recent data might be hidden    Patient Care Team: [name], MD as PCP - General (Family  Medicine) [name], MD as PCP - Cardiology (Cardiology)   Assessment:   This is a routine wellness examination for Derrich.  Exercise Activities and Dietary recommendations Current Exercise Habits: Home exercise routine, Intensity: Mild, Exercise limited by: orthopedic condition(s)  Goals    . Exercise 150 min/wk Moderate Activity     Will try recumbent bike or discuss exercise with Orthopedic      . Increase water intake     [facility]  Phone: [phone]   Dennard Nip, [place] Risk  06/26/2018 06/26/2018 06/23/2018 06/08/2017 12/21/2016  Falls in the past year? - Yes Yes No No  Number falls in past yr: - 1 1 - -  Injury with Fall? Yes Yes Yes - -  Follow up Education provided Education provided - - -     Depression Screen PHQ 2/9 Scores 06/26/2018 06/23/2018 06/08/2017 12/21/2016  PHQ - 2 Score 0 0 0 0    Cognitive Function MMSE - Mini Mental State Exam 06/26/2018 06/08/2017  Not completed: (No Data) (No Data)     Ad8 score reviewed for issues:  Issues making decisions:  Less interest in hobbies / activities:  Repeats questions, stories (family complaining):  Trouble using ordinary gadgets (microwave, computer, phone):  Forgets the month or year:   Mismanaging finances:   Remembering appts:  Daily problems with thinking and/or memory: Ad8 score is=0        Immunization History  Administered Date(s) Administered  . Influenza Split 07/04/2012  . Influenza, High Dose Seasonal PF 05/28/2016, 06/08/2017, 06/23/2018  . Influenza,inj,Quad PF,6+ Mos 07/05/2013, 05/22/2014, 06/17/2015  . Pneumococcal Conjugate-13 12/01/2015  . Pneumococcal Polysaccharide-23 10/05/2006, 11/30/2016  . Td 10/06/2009  . Zoster 08/21/2013  . Zoster Recombinat (Shingrix) 12/02/2016, 04/02/2017      Screening Tests Health Maintenance   Topic Date Due  . FOOT EXAM  06/25/2017  . OPHTHALMOLOGY EXAM  09/30/2018  . COLONOSCOPY  11/19/2018  . HEMOGLOBIN A1C  12/22/2018  . TETANUS/TDAP  10/07/2019  . INFLUENZA VACCINE  Completed  . Hepatitis C Screening  Completed  . PNA vac Low Risk Adult  Completed       Plan:      PCP Notes   Health Maintenance shingrix completed without issue Just had office exam  Foot exam completed and WNL  Toenails good cap refill; sensation good, no callus   09/04/2017 no retinopathy  Dr. Raquel James - has apt later this year   Hearing wnl; no issue with conversation  Colonoscopy 11/2015 - will repeat prior to his per his report q 3 years Changed the metric from 5 to 3 at his request due to family hx of cancer. He understands he needs to contact GI for follow up   PSA 9/29    Abnormal Screens  None  Referrals  none  Patient concerns; Going on cpap ordered by  Dr. Rayann Heman in Cardiology  He is a little anxious and provided education   Nurse Concerns; As noted   Next PCP apt 12/22/2018       I have personally reviewed and noted the following in the patient's chart:   . Medical and social history . Use of alcohol, tobacco or illicit drugs  . Current medications and supplements . Functional ability and status . Nutritional status . Physical activity . Advanced directives . List of other physicians . Hospitalizations, surgeries, and ER visits in previous 12 months . Vitals . Screenings to include cognitive, depression, and falls . Referrals and appointments  In addition, I have reviewed and discussed with patient certain preventive protocols, quality metrics, and best practice recommendations. A written personalized care plan for preventive services as well as general preventive health recommendations were provided to patient.     YBWLS,LHTDS, RN  06/26/2018  I have reviewed the documentation for the [facility] and [place] provided by the health coach and  agree with their documentation. I was immediately available for any questions  [name] MD [facility] [facility]

## 2018-06-26 ENCOUNTER — Ambulatory Visit (INDEPENDENT_AMBULATORY_CARE_PROVIDER_SITE_OTHER): Payer: Medicare Other

## 2018-06-26 VITALS — Ht 70.0 in | Wt 263.0 lb

## 2018-06-26 DIAGNOSIS — Z0000 Encounter for general adult medical examination without abnormal findings: Secondary | ICD-10-CM

## 2018-06-26 NOTE — Patient Instructions (Addendum)
Mr. Adam [name] , Thank you for taking time to come for your Medicare Wellness Visit. I appreciate your ongoing commitment to your health goals. Please review the following plan we discussed and let me know if I can assist you in the future.   You can take your tetanus with Pertussis in it at any time prior to 10 years from your last tetanus. You may want to consider this if you are around infants or young children. Medicare will only pay if you incur an injury and it has been 10 years since your last dose.    These are the goals we discussed: Goals    . Increase water intake     [facility]  Phone: [phone]   Adam Nip, MD [phone]                 Falls can be the main reason people lose their independence Think before you CLIMB  4 things you can do to prevent falls 1. Begin an exercise program to improve your leg strength and balance 2. Ask the doctor to review medicines for fall risk 3. Get an annual eye check up and update your eye glasses;  ([employer] still assist with eyewear:  Reviewed for annual vision exam;[facility] assistance for eyewear is coordinated through [facility]; Please call Cherlyn Labella at [phone] 4. Make your home safer by: Removing clutter and tripping hazzards Putting railing on stairs and adding grab bars to the bathroom  Have good lighting; especially on stairs and at night when getting up to the bathroom   Exercise; including walking can assist with maintaining tone and balance and help prevent falls as you age.        Health Maintenance, Male A healthy lifestyle and preventive care is important for your health and wellness. Ask your health care provider about what schedule of regular examinations is right for you. What should I know about weight and diet? Eat a Healthy Diet  Eat plenty of vegetables, fruits, whole grains, low-fat  dairy products, and lean protein.  Do not eat a lot of foods high in solid fats, added sugars, or salt.  Maintain a Healthy Weight Regular exercise can help you achieve or maintain a healthy weight. You should:  Do at least 150 minutes of exercise each week. The exercise should increase your heart rate and make you sweat (moderate-intensity exercise).  Do strength-training exercises at least twice a week.  Watch Your Levels of Cholesterol and Blood Lipids  Have your blood tested for lipids and cholesterol every 5 years starting at 68 years of age. If you are at high risk for heart disease, you should start having your blood tested when you are 68 years old. You may need to have your cholesterol levels checked more often if: ? Your lipid or cholesterol levels are high. ? You are older than 68 years of age. ? You are at high risk for heart disease.  What should I know about cancer screening? Many types of cancers can be detected early and may often be prevented. Lung Cancer  You should be screened every year for lung cancer if: ? You are a current smoker who has smoked for at least 30 years. ? You are a former smoker who has quit within the past 15 years.  Talk to your health care provider about your screening options, when you should start screening,  and how often you should be screened.  Colorectal Cancer  Routine colorectal cancer screening usually begins at 68 years of age and should be repeated every 5-10 years until you are 68 years old. You may need to be screened more often if early forms of precancerous polyps or small growths are found. Your health care provider may recommend screening at an earlier age if you have risk factors for colon cancer.  Your health care provider may recommend using home test kits to check for hidden blood in the stool.  A small camera at the end of a tube can be used to examine your colon (sigmoidoscopy or colonoscopy). This checks for the earliest  forms of colorectal cancer.  Prostate and Testicular Cancer  Depending on your age and overall health, your health care provider may do certain tests to screen for prostate and testicular cancer.  Talk to your health care provider about any symptoms or concerns you have about testicular or prostate cancer.  Skin Cancer  Check your skin from head to toe regularly.  Tell your health care provider about any new moles or changes in moles, especially if: ? There is a change in a mole's size, shape, or color. ? You have a mole that is larger than a pencil eraser.  Always use sunscreen. Apply sunscreen liberally and repeat throughout the day.  Protect yourself by wearing long sleeves, pants, a wide-brimmed hat, and sunglasses when outside.  What should I know about heart disease, diabetes, and high blood pressure?  If you are 1-72 years of age, have your blood pressure checked every 3-5 years. If you are 25 years of age or older, have your blood pressure checked every year. You should have your blood pressure measured twice-once when you are at a hospital or clinic, and once when you are not at a hospital or clinic. Record the average of the two measurements. To check your blood pressure when you are not at a hospital or clinic, you can use: ? An automated blood pressure machine at a pharmacy. ? A home blood pressure monitor.  Talk to your health care provider about your target blood pressure.  If you are between 75-29 years old, ask your health care provider if you should take aspirin to prevent heart disease.  Have regular diabetes screenings by checking your fasting blood sugar level. ? If you are at a normal weight and have a low risk for diabetes, have this test once every three years after the age of 76. ? If you are overweight and have a high risk for diabetes, consider being tested at a younger age or more often.  A one-time screening for abdominal aortic aneurysm (AAA) by  ultrasound is recommended for men aged 17-75 years who are current or former smokers. What should I know about preventing infection? Hepatitis B If you have a higher risk for hepatitis B, you should be screened for this virus. Talk with your health care provider to find out if you are at risk for hepatitis B infection. Hepatitis C Blood testing is recommended for:  Everyone born from [birth year] through 1965.  Anyone with known risk factors for hepatitis C.  Sexually Transmitted Diseases (STDs)  You should be screened each year for STDs including gonorrhea and chlamydia if: ? You are sexually active and are younger than 68 years of age. ? You are older than 68 years of age and your health care provider tells you that you are at risk for this  type of infection. ? Your sexual activity has changed since you were last screened and you are at an increased risk for chlamydia or gonorrhea. Ask your health care provider if you are at risk.  Talk with your health care provider about whether you are at high risk of being infected with HIV. Your health care provider may recommend a prescription medicine to help prevent HIV infection.  What else can I do?  Schedule regular health, dental, and eye exams.  Stay current with your vaccines (immunizations).  Do not use any tobacco products, such as cigarettes, chewing tobacco, and e-cigarettes. If you need help quitting, ask your health care provider.  Limit alcohol intake to no more than 2 drinks per day. One drink equals 12 ounces of beer, 5 ounces of wine, or 1 ounces of hard liquor.  Do not use street drugs.  Do not share needles.  Ask your health care provider for help if you need support or information about quitting drugs.  Tell your health care provider if you often feel depressed.  Tell your health care provider if you have ever been abused or do not feel safe at home. This information is not intended to replace advice given to you by your  health care provider. Make sure you discuss any questions you have with your health care provider. Document Released: 03/18/2008 Document Revised: 05/19/2016 Document Reviewed: 06/24/2015 Elsevier Interactive Patient Education  2018 [place] in the Home Falls can cause injuries and can affect people from all age groups. There are many simple things that you can do to make your home safe and to help prevent falls. What can I do on the outside of my home?  Regularly repair the edges of walkways and driveways and fix any cracks.  Remove high doorway thresholds.  Trim any shrubbery on the main path into your home.  Use bright outdoor lighting.  Clear walkways of debris and clutter, including tools and rocks.  Regularly check that handrails are securely fastened and in good repair. Both sides of any steps should have handrails.  Install guardrails along the edges of any raised decks or porches.  Have leaves, snow, and ice cleared regularly.  Use sand or salt on walkways during winter months.  In the garage, clean up any spills right away, including grease or oil spills. What can I do in the bathroom?  Use night lights.  Install grab bars by the toilet and in the tub and shower. Do not use towel bars as grab bars.  Use non-skid mats or decals on the floor of the tub or shower.  If you need to sit down while you are in the shower, use a plastic, non-slip stool.  Keep the floor dry. Immediately clean up any water that spills on the floor.  Remove soap buildup in the tub or shower on a regular basis.  Attach bath mats securely with double-sided non-slip rug tape.  Remove throw rugs and other tripping hazards from the floor. What can I do in the bedroom?  Use night lights.  Make sure that a bedside light is easy to reach.  Do not use oversized bedding that drapes onto the floor.  Have a firm chair that has side arms to use for getting  dressed.  Remove throw rugs and other tripping hazards from the floor. What can I do in the kitchen?  Clean up any spills right away.  Avoid walking on wet floors.  Place frequently used items in easy-to-reach places.  If you need to reach for something above you, use a sturdy step stool that has a grab bar.  Keep electrical cables out of the way.  Do not use floor polish or wax that makes floors slippery. If you have to use wax, make sure that it is non-skid floor wax.  Remove throw rugs and other tripping hazards from the floor. What can I do in the stairways?  Do not leave any items on the stairs.  Make sure that there are handrails on both sides of the stairs. Fix handrails that are broken or loose. Make sure that handrails are as long as the stairways.  Check any carpeting to make sure that it is firmly attached to the stairs. Fix any carpet that is loose or worn.  Avoid having throw rugs at the top or bottom of stairways, or secure the rugs with carpet tape to prevent them from moving.  Make sure that you have a light switch at the top of the stairs and the bottom of the stairs. If you do not have them, have them installed. What are some other fall prevention tips?  Wear closed-toe shoes that fit well and support your feet. Wear shoes that have rubber soles or low heels.  When you use a stepladder, make sure that it is completely opened and that the sides are firmly locked. Have someone hold the ladder while you are using it. Do not climb a closed stepladder.  Add color or contrast paint or tape to grab bars and handrails in your home. Place contrasting color strips on the first and last steps.  Use mobility aids as needed, such as canes, walkers, scooters, and crutches.  Turn on lights if it is dark. Replace any light bulbs that burn out.  Set up furniture so that there are clear paths. Keep the furniture in the same spot.  Fix any uneven floor surfaces.  Choose a  carpet design that does not hide the edge of steps of a stairway.  Be aware of any and all pets.  Review your medicines with your healthcare provider. Some medicines can cause dizziness or changes in blood pressure, which increase your risk of falling. Talk with your health care provider about other ways that you can decrease your risk of falls. This may include working with a physical therapist or trainer to improve your strength, balance, and endurance. This information is not intended to replace advice given to you by your health care provider. Make sure you discuss any questions you have with your health care provider. Document Released: 09/10/2002 Document Revised: 02/17/2016 Document Reviewed: 10/25/2014 Elsevier Interactive Patient Education  [employer].   This is a list of the screening recommended for you and due dates:  Health Maintenance  Topic Date Due  . Complete foot exam   06/25/2017  . Eye exam for diabetics  09/30/2018  . Hemoglobin A1C  12/22/2018  . Tetanus Vaccine  10/07/2019  . Colon Cancer Screening  11/19/2020  . Flu Shot  Completed  .  Hepatitis C: One time screening is recommended by Center for Disease Control  (CDC) for  adults born from [birth year] through 1966.   Completed  . Pneumonia vaccines  Completed    Adam [name] , Thank you for taking time to come for your Medicare Wellness Visit. I appreciate your ongoing commitment to your health goals. Please review the following plan we discussed and let me know if  I can assist you in the future.     These are the goals we discussed: Goals    . Exercise 150 min/wk Moderate Activity     Will try recumbent bike or discuss exercise with Orthopedic      . Increase water intake     [facility]  Phone: [phone]   Adam Nip, MD [phone]                  This is a list of the screening  recommended for you and due dates:  Health Maintenance  Topic Date Due  . Complete foot exam   06/25/2017  . Eye exam for diabetics  09/30/2018  . Colon Cancer Screening  11/19/2018  . Hemoglobin A1C  12/22/2018  . Tetanus Vaccine  10/07/2019  . Flu Shot  Completed  .  Hepatitis C: One time screening is recommended by Center for Disease Control  (CDC) for  adults born from [birth year] through 1966.   Completed  . Pneumonia vaccines  Completed

## 2018-06-27 ENCOUNTER — Other Ambulatory Visit: Payer: Self-pay | Admitting: Internal Medicine

## 2018-06-30 ENCOUNTER — Telehealth: Payer: Self-pay

## 2018-06-30 ENCOUNTER — Encounter: Payer: Self-pay | Admitting: Internal Medicine

## 2018-06-30 ENCOUNTER — Ambulatory Visit: Payer: Medicare Other | Admitting: Internal Medicine

## 2018-06-30 VITALS — BP 116/78 | HR 90 | Ht 70.0 in | Wt 260.2 lb

## 2018-06-30 DIAGNOSIS — I481 Persistent atrial fibrillation: Secondary | ICD-10-CM | POA: Diagnosis not present

## 2018-06-30 DIAGNOSIS — I4819 Other persistent atrial fibrillation: Secondary | ICD-10-CM

## 2018-06-30 NOTE — Patient Instructions (Addendum)
Medication Instructions:  Your physician has recommended you make the following change in your medication:  1.  Stop taking diltiazem  Labwork: None ordered.  Testing/Procedures: None ordered.  Follow-Up: Your physician wants you to follow-up in: 3 months with Dr. Rayann Heman.      Any Other Special Instructions Will Be Listed Below (If Applicable).  If you need a refill on your cardiac medications before your next appointment, please call your pharmacy.

## 2018-06-30 NOTE — Telephone Encounter (Signed)
Call placed to Pt.  Advised Pt this nurse was able to schedule Pt with Dr. Radford Pax next Friday 07/07/2018 at 9:40 am to discuss results of sleep study.  Pt thanked nurse for follow up.  Dr. Rayann Heman notified of appt.

## 2018-06-30 NOTE — Progress Notes (Signed)
PCP: [name], MD Primary Cardiologist: Dr Festus Barren is a 67 y.o. male who presents today for routine electrophysiology followup.  Since his recent afib ablation, the patient reports doing very well.  he denies procedure related complications and is pleased with the results of the procedure.  He has been diagnosed with OSA but has not yet used CPAP.  Today, he denies symptoms of palpitations, chest pain, shortness of breath,  lower extremity edema, dizziness, presyncope, or syncope.  The patient is otherwise without complaint today.   Past Medical History:  Diagnosis Date  . Allergy   . Anxiety    history of PTSD following CABG  . Arthritis   . Asthma   . Colitis- colonoscopy 2014 07/13/2015  . COPD (chronic obstructive pulmonary disease) ([place])   . Coronary artery disease    x 6  . Depression   . Family history of polyps in the colon   . GERD (gastroesophageal reflux disease)   . Gout   . H/O atrial fibrillation without current medication    following CABG with no documented episodes since then.  Marland Kitchen Hx of adenomatous colonic polyps 08/12/2010  . Hyperlipidemia   . Hypertension   . Prediabetes   . RLS (restless legs syndrome)   . 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 APPENDECTOMY    . ATRIAL FIBRILLATION ABLATION N/A 03/31/2018   Procedure: ATRIAL FIBRILLATION ABLATION;  Surgeon: [name], MD;  Location: [place] [facility];  Service: Cardiovascular;  Laterality: N/A;  . COLONOSCOPY W/ BIOPSIES    . CORONARY ANGIOPLASTY WITH STENT PLACEMENT    . CORONARY ARTERY BYPASS GRAFT    . FINGER SURGERY  04/2018   Small finger left hand  . TEE WITHOUT CARDIOVERSION N/A 03/30/2018   Procedure: TRANSESOPHAGEAL ECHOCARDIOGRAM (TEE);  Surgeon: [name], MD;  Location: [facility];  Service: Cardiovascular;  Laterality: N/A;  . TOTAL HIP ARTHROPLASTY      ROS- all systems are personally  reviewed and negatives except as per HPI above  Current Outpatient Medications  Medication Sig Dispense Refill  . acetaminophen (TYLENOL) 650 MG CR tablet Take 1,950 mg by mouth every 8 (eight) hours as needed for pain.    Marland Kitchen albuterol (PROAIR HFA) 108 (90 BASE) MCG/ACT inhaler Inhale 1-2 puffs into the lungs every 6 (six) hours as needed for wheezing or shortness of breath. (Patient taking differently: Inhale 1 puff into the lungs every 6 (six) hours as needed for wheezing or shortness of breath. ) 8 g 5  . allopurinol (ZYLOPRIM) 300 MG tablet TAKE 1 TABLET BY MOUTH EVERY DAY 90 tablet 1  . BREO ELLIPTA 100-25 MCG/INH AEPB INHALE 1 PUFF INTO THE LUNGS DAILY 60 each 5  . Coenzyme Q10 (CO Q-10) 100 MG CAPS Take 100 mg by mouth daily.     Marland Kitchen DILT-XR 120 MG 24 hr capsule Take 120 mg by mouth daily.  9  . dofetilide (TIKOSYN) 125 MCG capsule Take 1 capsule (125 mcg total) by mouth every 12 (twelve) hours. 60 capsule 2  . econazole nitrate 1 % cream Apply 1 application topically every other day.     . empagliflozin (JARDIANCE) 25 MG TABS tablet Take 25 mg by mouth daily.    . fenofibrate 160 MG tablet TAKE 1 TABLET(160 MG) BY MOUTH DAILY 90 tablet 0  . fluticasone (FLONASE) 50 MCG/ACT nasal spray Place 1 spray into  both nostrils daily.     . furosemide (LASIX) 20 MG tablet TAKE 1 TABLET(20 MG) BY MOUTH DAILY AS NEEDED FOR LEG SWELLING 30 tablet 0  . glucose blood (ONETOUCH VERIO) test strip Test once daily.  Dx E11.9 100 each 3  . ketoconazole (NIZORAL) 2 % cream ketoconazole 2 % topical cream  APPLY NIGHTLY    . LORazepam (ATIVAN) 0.5 MG tablet Take 1 tablet (0.5 mg total) by mouth every 8 (eight) hours as needed for anxiety. 90 tablet 5  . losartan-hydrochlorothiazide (HYZAAR) 100-25 MG tablet TAKE 1 TABLET BY MOUTH EVERY DAY 90 tablet 1  . metFORMIN (GLUCOPHAGE) 1000 MG tablet TAKE 1 TABLET BY MOUTH TWICE DAILY WITH A MEAL 180 tablet 1  . metoprolol (LOPRESSOR) 100 MG tablet Take 100 mg by mouth 2  (two) times daily.    . montelukast (SINGULAIR) 10 MG tablet TAKE 1 TABLET BY MOUTH AT BEDTIME 90 tablet 1  . Olopatadine HCl (PATADAY) 0.2 % SOLN Apply 1 drop to eye as needed. (Patient taking differently: Place 1 drop into both eyes daily as needed (for dry eyes). ) 2.5 mL 5  . pantoprazole (PROTONIX) 40 MG tablet TAKE 1 TABLET BY MOUTH TWICE DAILY (Patient taking differently: TAKE 1 TABLET BY MOUTH  DAILY IN THE MORNING) 180 tablet 1  . potassium chloride SA (K-DUR,KLOR-CON) 20 MEQ tablet Take 2 tablets (40 mEq total) by mouth daily. 60 tablet 12  . pramipexole (MIRAPEX) 0.25 MG tablet TAKE 1 TABLET(0.25 MG) BY MOUTH AT BEDTIME 90 tablet 1  . ranitidine (ZANTAC) 150 MG capsule Take 150 mg by mouth every evening.    . simvastatin (ZOCOR) 80 MG tablet TAKE 1 TABLET BY MOUTH EVERY DAY 90 tablet 2  . SPIRIVA HANDIHALER 18 MCG inhalation capsule INHALE CONTENTS OF 1 CAPSULE ONCE DAILY USING HANDIHALER 90 capsule 1  . TOUJEO SOLOSTAR 300 UNIT/ML SOPN INJECT 80 UNITS INTO THE SKIN AS DIRECTED (Patient taking differently: 60 Units. ) 24 pen 0  . XARELTO 20 MG TABS tablet TAKE 1 TABLET BY MOUTH DAILY WITH SUPPER 30 tablet 3  . Zoster Vaccine Adjuvanted [facility]) injection Shingrix (PF) 50 mcg/0.5 mL intramuscular suspension, kit  ADM 0.5ML IM UTD     No current facility-administered medications for this visit.     Physical Exam: Vitals:   06/30/18 0935  BP: 116/78  Pulse: 90  SpO2: 98%  Weight: 260 lb 3.2 oz (118 kg)  Height: '5\' 10"'  (1.778 m)    GEN- The patient is well appearing, alert and oriented x 3 today.   Head- normocephalic, atraumatic Eyes-  Sclera clear, conjunctiva pink Ears- hearing intact Oropharynx- clear Lungs- Clear to ausculation bilaterally, normal work of breathing Heart- Regular rate and rhythm, no murmurs, rubs or gallops, PMI not laterally displaced GI- soft, NT, ND, + BS Extremities- no clubbing, cyanosis, or edema  EKG tracing ordered today is personally  reviewed and shows sinus rhythm 90 bpm, PR 154 msec, QRS 88 msec, Qtc 469 msec  Assessment and Plan:  1. Persistent atrial fibrillation Doing well s/p ablation chads2vasc score is 3.  Continue on xarelto continue tikosyn Stop diltiazem Consider reducing metoprolol upon return  2. OSA Has not yet been started on CPAP He would like to speak with Dr Radford Pax about treatment We will schedule follow-up with Dr Radford Pax to discuss  3. HTN Stable No change required today   Return to see me in 3 months  [name] MD, [facility] 06/30/2018 9:56 AM

## 2018-07-01 ENCOUNTER — Encounter (HOSPITAL_BASED_OUTPATIENT_CLINIC_OR_DEPARTMENT_OTHER): Payer: Medicare Other

## 2018-07-03 DIAGNOSIS — L820 Inflamed seborrheic keratosis: Secondary | ICD-10-CM | POA: Diagnosis not present

## 2018-07-03 DIAGNOSIS — B078 Other viral warts: Secondary | ICD-10-CM | POA: Diagnosis not present

## 2018-07-03 DIAGNOSIS — L570 Actinic keratosis: Secondary | ICD-10-CM | POA: Diagnosis not present

## 2018-07-03 DIAGNOSIS — L821 Other seborrheic keratosis: Secondary | ICD-10-CM | POA: Diagnosis not present

## 2018-07-03 DIAGNOSIS — L814 Other melanin hyperpigmentation: Secondary | ICD-10-CM | POA: Diagnosis not present

## 2018-07-04 ENCOUNTER — Other Ambulatory Visit: Payer: Self-pay | Admitting: Family Medicine

## 2018-07-05 DIAGNOSIS — M25551 Pain in right hip: Secondary | ICD-10-CM | POA: Diagnosis not present

## 2018-07-06 NOTE — Progress Notes (Signed)
Cardiology Office Note:    Date:  07/07/2018   ID:  Adam [name], DOB 18-Sep-1951, MRN [id number]  PCP:  [name], MD  Cardiologist:  [name], MD    Referring MD: [name], MD   Chief Complaint  Patient presents with  . Sleep Apnea  . Hypertension    History of Present Illness:    Adam [name] is a 67 y.o. male with a hx of ASCAD, PAF and HTN.  He was referred by Dr. Rayann Heman for sleep study due to snoring.  There was no significant OSA but he only slept 3 hours and therefore underwent repeat study with sleep aide which  Showed mild OSA with an AHI of 9.9/hr and O2 desaturations as low as 76%.  He underwent CPAP titration to 16cm H2O.    He has his CPAP titration on 06/08/2018 and CPAP was ordered but he has not gotten his device yet.  He tried 2 different masks at the sleep lab and like the full face Dreamwear mask. He is not very happy about having sleep apnea and had a lot of questions as to why he had to wear CPAP.  His O2 sats were < 89% for over 80% of the total sleep time during the sleep study.  He tells me that he is not going to like wearing it and would rather just go on O2 at night.  He is going to a 50th high school reunion in 3 weeks and does not want to start CPAP until after that because he is not going to take it on any trips.   Past Medical History:  Diagnosis Date  . Allergy   . Anxiety    history of PTSD following CABG  . Arthritis   . Asthma   . Colitis- colonoscopy 2014 07/13/2015  . COPD (chronic obstructive pulmonary disease) ([place])   . Coronary artery disease    x 6  . Depression   . Family history of polyps in the colon   . GERD (gastroesophageal reflux disease)   . Gout   . H/O atrial fibrillation without current medication    following CABG with no documented episodes since then.  Marland Kitchen Hx of adenomatous colonic polyps 08/12/2010  . Hyperlipidemia   . Hypertension   . OSA (obstructive sleep apnea)   . Prediabetes   . RLS  (restless legs syndrome)   . 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 APPENDECTOMY    . ATRIAL FIBRILLATION ABLATION N/A 03/31/2018   Procedure: ATRIAL FIBRILLATION ABLATION;  Surgeon: [name], MD;  Location: [facility];  Service: Cardiovascular;  Laterality: N/A;  . COLONOSCOPY W/ BIOPSIES    . CORONARY ANGIOPLASTY WITH STENT PLACEMENT    . CORONARY ARTERY BYPASS GRAFT    . FINGER SURGERY  04/2018   Small finger left hand  . TEE WITHOUT CARDIOVERSION N/A 03/30/2018   Procedure: TRANSESOPHAGEAL ECHOCARDIOGRAM (TEE);  Surgeon: [name], MD;  Location: [facility];  Service: Cardiovascular;  Laterality: N/A;  . TOTAL HIP ARTHROPLASTY      Current Medications: Current Meds  Medication Sig  . acetaminophen (TYLENOL) 650 MG CR tablet Take 1,950 mg by mouth every 8 (eight) hours as needed for pain.  Marland Kitchen albuterol (PROAIR HFA) 108 (90 BASE) MCG/ACT inhaler Inhale 1-2 puffs into the lungs every 6 (six) hours as needed for wheezing or shortness of breath.  Marland Kitchen  allopurinol (ZYLOPRIM) 300 MG tablet TAKE 1 TABLET BY MOUTH EVERY DAY  . BREO ELLIPTA 100-25 MCG/INH AEPB INHALE 1 PUFF INTO THE LUNGS DAILY  . Coenzyme Q10 (CO Q-10) 100 MG CAPS Take 100 mg by mouth daily.   Marland Kitchen dofetilide (TIKOSYN) 125 MCG capsule Take 1 capsule (125 mcg total) by mouth every 12 (twelve) hours.  Marland Kitchen econazole nitrate 1 % cream Apply 1 application topically every other day.   . fenofibrate 160 MG tablet TAKE 1 TABLET(160 MG) BY MOUTH DAILY  . fluticasone (FLONASE) 50 MCG/ACT nasal spray Place 1 spray into both nostrils daily.   . furosemide (LASIX) 20 MG tablet TAKE 1 TABLET(20 MG) BY MOUTH DAILY AS NEEDED FOR LEG SWELLING  . glucose blood (ONETOUCH VERIO) test strip Test once daily.  Dx E11.9  . JARDIANCE 25 MG TABS tablet TAKE 1 TABLET BY MOUTH DAILY  . ketoconazole (NIZORAL) 2 % cream ketoconazole 2 % topical cream  APPLY NIGHTLY  .  LORazepam (ATIVAN) 0.5 MG tablet Take 1 tablet (0.5 mg total) by mouth every 8 (eight) hours as needed for anxiety.  Marland Kitchen losartan-hydrochlorothiazide (HYZAAR) 100-25 MG tablet TAKE 1 TABLET BY MOUTH EVERY DAY  . metFORMIN (GLUCOPHAGE) 1000 MG tablet TAKE 1 TABLET BY MOUTH TWICE DAILY WITH A MEAL  . metoprolol (LOPRESSOR) 100 MG tablet Take 100 mg by mouth 2 (two) times daily.  . montelukast (SINGULAIR) 10 MG tablet TAKE 1 TABLET BY MOUTH AT BEDTIME  . Olopatadine HCl (PATADAY) 0.2 % SOLN Apply 1 drop to eye as needed.  . pantoprazole (PROTONIX) 40 MG tablet TAKE 1 TABLET BY MOUTH TWICE DAILY (Patient taking differently: TAKE 1 TABLET BY MOUTH  DAILY IN THE MORNING)  . potassium chloride SA (K-DUR,KLOR-CON) 20 MEQ tablet Take 2 tablets (40 mEq total) by mouth daily.  . pramipexole (MIRAPEX) 0.25 MG tablet TAKE 1 TABLET(0.25 MG) BY MOUTH AT BEDTIME  . ranitidine (ZANTAC) 150 MG capsule Take 150 mg by mouth every evening.  . simvastatin (ZOCOR) 80 MG tablet TAKE 1 TABLET BY MOUTH EVERY DAY  . SPIRIVA HANDIHALER 18 MCG inhalation capsule INHALE CONTENTS OF 1 CAPSULE ONCE DAILY USING HANDIHALER  . TOUJEO SOLOSTAR 300 UNIT/ML SOPN INJECT 80 UNITS INTO THE SKIN AS DIRECTED (Patient taking differently: 60 Units. )  . XARELTO 20 MG TABS tablet TAKE 1 TABLET BY MOUTH DAILY WITH SUPPER  . Zoster Vaccine Adjuvanted [facility]) injection Shingrix (PF) 50 mcg/0.5 mL intramuscular suspension, kit  ADM 0.5ML IM UTD     Allergies:   Amoxicillin; Augmentin [amoxicillin-pot clavulanate]; Azithromycin; Clindamycin/lincomycin; and Keflex [cephalexin]   Social History   Socioeconomic History  . Marital status: Married    Spouse name: Not on file  . Number of children: Not on file  . Years of education: Not on file  . Highest education level: Not on file  Occupational History  . Occupation: retired  Scientific laboratory technician  . Financial resource strain: Not on file  . Food insecurity:    Worry: Not on file    Inability:  Not on file  . Transportation needs:    Medical: Not on file    Non-medical: Not on file  Tobacco Use  . Smoking status: Former Smoker    Packs/day: 1.00    Years: 7.00    Pack years: 7.00    Types: Cigarettes    Last attempt to quit: 10/04/1977    Years since quitting: 40.7  . Smokeless tobacco: Never Used  . Tobacco comment: never smoked  over 1 pack   Substance and Sexual Activity  . Alcohol use: Yes    Comment: stopped wine and now drinks 3 to 4 scotches   . Drug use: No    Comment: Smoked Marijuana back in 1970s  . Sexual activity: Yes  Lifestyle  . Physical activity:    Days per week: Not on file    Minutes per session: Not on file  . Stress: Not on file  Relationships  . Social connections:    Talks on phone: Not on file    Gets together: Not on file    Attends religious service: Not on file    Active member of club or organization: Not on file    Attends meetings of clubs or organizations: Not on file    Relationship status: Not on file  Other Topics Concern  . Not on file  Social History Narrative   Married, no children   Textile industry x yrs   73 off early 60's and 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 work - returned to [employer] from [place] 2014     Family History: The patient's family history includes Colon cancer in his mother; Heart disease in his paternal grandfather and paternal grandmother.  ROS:   Please see the history of present illness.    ROS  All other systems reviewed and negative.   EKGs/Labs/Other Studies Reviewed:    The following studies were reviewed today: PAP download  EKG:  EKG is not ordered today.   Recent Labs: 10/17/2017: Pro B Natriuretic peptide (BNP) 241.0 03/28/2018: Hemoglobin 16.3; Platelets 209 05/01/2018: BUN 11; Creatinine, Ser 0.89; Magnesium 1.9; Potassium 4.2; Sodium 137 06/23/2018: ALT 26   Recent Lipid Panel    Component Value Date/Time   CHOL 199 06/23/2018 1118   TRIG 362.0 (H) 06/23/2018 1118   HDL 39.80 06/23/2018 1118    CHOLHDL 5 06/23/2018 1118   VLDL 72.4 (H) 06/23/2018 1118   LDLCALC 49 08/14/2013 1128   LDLDIRECT 124.0 06/23/2018 1118    Physical Exam:    VS:  BP 128/76   Pulse 86   Ht '5\' 10"'  (1.778 m)   Wt 260 lb (117.9 kg)   SpO2 95%   BMI 37.31 kg/m     Wt Readings from Last 3 Encounters:  07/07/18 260 lb (117.9 kg)  06/30/18 260 lb 3.2 oz (118 kg)  06/26/18 263 lb (119.3 kg)     GEN:  Well nourished, well developed in no acute distress HEENT: Normal NECK: No JVD; No carotid bruits LYMPHATICS: No lymphadenopathy CARDIAC: RRR, no murmurs, rubs, gallops RESPIRATORY:  Clear to auscultation without rales, wheezing or rhonchi  ABDOMEN: Soft, non-tender, non-distended MUSCULOSKELETAL:  No edema; No deformity  SKIN: Warm and dry NEUROLOGIC:  Alert and oriented x 3 PSYCHIATRIC:  Normal affect   ASSESSMENT:    1. Obstructive sleep apnea syndrome   2. Essential hypertension, benign   3. Obesity (BMI 30-39.9)    PLAN:    In order of problems listed above:  1.  OSA -  He has mild OSA with significant nocturnal hypoxemia with O2 sats < 89% for > 80% of total sleep time.  I have recommended that he give CPAP a try.  He liked the Respironics Dreamware mask the most so we will contact the DME to get him set up after his trip to [place].  We did talk briefly about the Inspire device if he could not tolerate the PAP.   2.  HTN - BP is controlled  on exam today.  He will continue on Hyzaar 100-5m daily and Lopressor 1072mBID.    3.  Obesity - I have encouraged him to get into a routine exercise program and cut back on carbs and portions.    Medication Adjustments/Labs and Tests Ordered: Current medicines are reviewed at length with the patient today.  Concerns regarding medicines are outlined above.  No orders of the defined types were placed in this encounter.  No orders of the defined types were placed in this encounter.   Signed, TrFransico HimMD  07/07/2018 9:49 AM    [place]

## 2018-07-07 ENCOUNTER — Ambulatory Visit: Payer: Medicare Other | Admitting: Cardiology

## 2018-07-07 ENCOUNTER — Encounter: Payer: Self-pay | Admitting: Cardiology

## 2018-07-07 VITALS — BP 128/76 | HR 86 | Ht 70.0 in | Wt 260.0 lb

## 2018-07-07 DIAGNOSIS — G4733 Obstructive sleep apnea (adult) (pediatric): Secondary | ICD-10-CM

## 2018-07-07 DIAGNOSIS — I10 Essential (primary) hypertension: Secondary | ICD-10-CM

## 2018-07-07 DIAGNOSIS — E669 Obesity, unspecified: Secondary | ICD-10-CM

## 2018-07-07 NOTE — Patient Instructions (Addendum)
Medication Instructions:  Your physician recommends that you continue on your current medications as directed. Please refer to the Current Medication list given to you today.  Follow-Up: Your physician recommends that you schedule a follow-up appointment in: 3 months with Dr. Radford Pax.  If you need a refill on your cardiac medications before your next appointment, please call your pharmacy.

## 2018-07-11 NOTE — Telephone Encounter (Signed)
RE: sleep study  [name], [name], CMA        Mailed to pt home address. I spoke with him and he was ok with this.     ----- Message -----  From: Freada Bergeron, CMA  Sent: 07/10/2018  1:38 PM EDT  To: Adam [name]  Subject: sleep study                    Wants sleep study in his my chart on 04-14-18

## 2018-07-13 DIAGNOSIS — S63297D Dislocation of distal interphalangeal joint of left little finger, subsequent encounter: Secondary | ICD-10-CM | POA: Diagnosis not present

## 2018-07-13 DIAGNOSIS — Z4789 Encounter for other orthopedic aftercare: Secondary | ICD-10-CM | POA: Diagnosis not present

## 2018-07-17 ENCOUNTER — Other Ambulatory Visit: Payer: Self-pay | Admitting: Family Medicine

## 2018-07-25 ENCOUNTER — Other Ambulatory Visit: Payer: Self-pay | Admitting: Family Medicine

## 2018-07-27 DIAGNOSIS — E119 Type 2 diabetes mellitus without complications: Secondary | ICD-10-CM | POA: Diagnosis not present

## 2018-07-31 ENCOUNTER — Other Ambulatory Visit: Payer: Self-pay | Admitting: Family Medicine

## 2018-08-01 ENCOUNTER — Telehealth: Payer: Self-pay | Admitting: Family Medicine

## 2018-08-01 ENCOUNTER — Other Ambulatory Visit: Payer: Self-pay

## 2018-08-01 MED ORDER — HYDROCHLOROTHIAZIDE 25 MG PO TABS: 25.0000 mg | ORAL_TABLET | Freq: Every day | ORAL | 3 refills | Status: DC

## 2018-08-01 MED ORDER — LOSARTAN POTASSIUM 100 MG PO TABS: 100.0000 mg | ORAL_TABLET | Freq: Every day | ORAL | 3 refills | Status: DC

## 2018-08-01 NOTE — Telephone Encounter (Signed)
Copied from [place] [id number]. Topic: Quick Communication - See Telephone Encounter >> Aug 01, 2018  4:34 PM Sheran Luz wrote: CRM for notification. See Telephone encounter for: 08/01/18.  Tiffany from [facility] calling to request medication change stating losartan-hydrochlorothiazide (HYZAAR) 100-25 MG tablet is on back order and would like to know if this could be sent as two separate medications. Please advise.  Pharmacy: [id number] - [place], [place] - [address]

## 2018-08-01 NOTE — Telephone Encounter (Signed)
Separate medications have been sent to the pharmacy for the patient.

## 2018-08-01 NOTE — Telephone Encounter (Signed)
Please advise if okay to send separate prescriptions. 

## 2018-08-01 NOTE — Telephone Encounter (Signed)
OK to separate out.

## 2018-08-02 ENCOUNTER — Telehealth: Payer: Self-pay

## 2018-08-02 NOTE — Telephone Encounter (Signed)
   Primary Cardiologist: [name], MD  Chart reviewed as part of pre-operative protocol coverage. Patient was contacted 08/02/2018 in reference to pre-operative risk assessment for pending surgery as outlined below.  Adam [name] was last seen by Dr. Wynonia Lawman while admitted to the hospital for atrial fibrillation 03/2018. He has a PMH of CAD s/p CABG in 2011, HTN, HLD, Atrial fibrillation, and OSASince that day, Adam [name] has been significantly limited in mobility due to hip pain. He has no complaints of chest pain at rest but does not exert himself due to hip pain.   Given last general cardiology evaluation occurred in the setting of a hospitalization, will arrange for patient to be seen for preoperative assessment prior to upcoming hip replacement surgery 09/12/18.   Pharmacologic clearance for xarelto was also requested and has been sent to the preop pharmacy pool.    Will remove patient from the preoperative pool.    Callback:  - Please arrange a new patient appointment for preoperative assessment for a patient of Dr. Thurman Coyer and notify the patient of the appointment date/time. Patient's surgery is scheduled for 09/12/18.  - Please route this note to the provider who will be seeing the patient and note that this appointment is for preoperative assessment.   Abigail Butts, PA-C 08/02/2018, 3:05 PM

## 2018-08-02 NOTE — Telephone Encounter (Signed)
   [facility] Pre-operative Risk Assessment    Request for surgical clearance:  1. What type of surgery is being performed? Right Total Hip Replacement   2. When is this surgery scheduled? 09/12/2018   3. What type of clearance is required (medical clearance vs. Pharmacy clearance to hold med vs. Both)? Pharmacy  4. Are there any medications that need to be held prior to surgery and how long? Hold Xarelto 3 days prior to surgery  5. Practice name and name of physician performing surgery? [facility]/Dr. Alvan Dame   6. What is your office phone number [phone]    7.   What is your office fax number [phone]  8.   Anesthesia type (None, local, MAC, general) ? Spinal   Adam [name] 08/02/2018, 2:06 PM  _________________________________________________________________   (provider comments below)

## 2018-08-02 NOTE — Telephone Encounter (Signed)
I will remove from the Pre Op Call Back pool.

## 2018-08-02 NOTE — Telephone Encounter (Signed)
Will route to pharmacy for further recommendations.

## 2018-08-02 NOTE — Telephone Encounter (Signed)
Adam [name] is a pt of Dr. Thurman Coyer, however with Dr.Tilley is unavailable currently and pt is needing surgery clearance. Per Pre OP Protocol I have scheduled the pt as a new pt since he has neve been seen by our office. Pt is scheduled to see Ellen Henri, [place] 08/08/18 @ 11 am. I will send a message to Guinevere Ferrari, CMA to have her request some records from Dr. Thurman Coyer office for 08/08/18 appt. I will route this pre op clearance to [place], [place] as Juluis Rainier.

## 2018-08-03 NOTE — Telephone Encounter (Signed)
Patient with diagnosis of atrial fibrillation on xarelto for anticoagulation.    Procedure: right total hip replacement Date of procedure: 09/12/18  CHADS2-VASc score of  4 (, HTN, AGE, DM2, , CAD, )  CrCl 132 Platelet count 170  Per office protocol, patient can hold Xarelto for 3 days prior to procedure.    For orthopedic procedures please be sure to resume therapeutic (not prophylactic) dosing.

## 2018-08-08 ENCOUNTER — Ambulatory Visit: Payer: Medicare Other | Admitting: Cardiology

## 2018-08-08 ENCOUNTER — Other Ambulatory Visit: Payer: Self-pay | Admitting: Family Medicine

## 2018-08-08 ENCOUNTER — Encounter: Payer: Self-pay | Admitting: Cardiology

## 2018-08-08 VITALS — BP 126/72 | HR 92 | Ht 70.0 in | Wt 261.4 lb

## 2018-08-08 DIAGNOSIS — I4819 Other persistent atrial fibrillation: Secondary | ICD-10-CM | POA: Diagnosis not present

## 2018-08-08 DIAGNOSIS — Z01810 Encounter for preprocedural cardiovascular examination: Secondary | ICD-10-CM | POA: Diagnosis not present

## 2018-08-08 DIAGNOSIS — I2510 Atherosclerotic heart disease of native coronary artery without angina pectoris: Secondary | ICD-10-CM

## 2018-08-08 NOTE — Patient Instructions (Signed)
Medication Instructions:  none If you need a refill on your cardiac medications before your next appointment, please call your pharmacy.   Lab work: none If you have labs (blood work) drawn today and your tests are completely normal, you will receive your results only by: . MyChart Message (if you have MyChart) OR . A paper copy in the mail If you have any lab test that is abnormal or we need to change your treatment, we will call you to review the results.  Testing/Procedures: none  Follow-Up: At [facility], you and your health needs are our priority.  As part of our continuing mission to provide you with exceptional heart care, we have created designated Provider Care Teams.  These Care Teams include your primary Cardiologist (physician) and Advanced Practice Providers (APPs -  Physician Assistants and Nurse Practitioners) who all work together to provide you with the care you need, when you need it. .   Any Other Special Instructions Will Be Listed Below (If Applicable).    

## 2018-08-08 NOTE — Progress Notes (Signed)
08/08/2018 Isley Zinni Tanzi   08/01/1951  [id number]  Primary Physician Elease Hashimoto, Alinda Sierras, MD Primary Cardiologist: Dr. Wynonia Lawman Electrophysiologist: Dr. Rayann Heman Sleep Clinic: Dr. Radford Pax   Reason for Visit/CC: Preoperative Cardiovascular Examination  Anticipated Surgery ( Right Total Hip Replacement)   HPI:  Adam [name] is a 67 y.o. male who is being seen today for preoperative cardiovascular examination. He is needing to undergo Right total hip replacement for hip OA. He is needing cardiac clearance as well as clearance to hold anticoagulation. His primary cardiologist has been Dr. Wynonia Lawman.   In addition to his severe hip OA, he has a PMH of CAD s/p CABG in 2011, HTN, HLD, Atrial fibrillation, and OSA. He underwent recent catheter ablation for atrial fibrillation by Dr. Rayann Heman 03/31/18. He is followed by Dr. Radford Pax in sleep clinic, but not yet started on CPAP. He had an echo in January of this year that showed normal LVEF and no significant valvular abnormalities. Stress test in 2013 showed no ischemia.  He denies any cardiac symptoms. No CP or dyspnea. Prior to his CABG and stents prior to CABG, he had classic anginal symptoms (CP and dyspnea). He denies any recurrence since his bypass in 2011. Due to his severe hip OA, he is a bit limited in regards to physical activity. He is unable to walk up a flight of stairs, but we were able to calculate METS using the DASI ([facility] Activity Status Index calculator). Functional Capacity in METS is 4.61. He denies any exertional CP or dyspnea with the activities we reviewed. His EKG today shows NSR. No ischemic abnormalities. HR and BP both well controlled.   Pharmacy has already weighed in on anticoagulation. Per pharmacy, "Per office protocol, patient can hold Xarelto for 3 days prior to procedure.  For orthopedic procedures please be sure to resume therapeutic (not prophylactic) dosing".  Current Meds  Medication Sig  . allopurinol (ZYLOPRIM) 300 MG  tablet TAKE 1 TABLET BY MOUTH EVERY DAY  . BREO ELLIPTA 100-25 MCG/INH AEPB INHALE 1 PUFF INTO THE LUNGS DAILY  . Coenzyme Q10 (CO Q-10) 100 MG CAPS Take 100 mg by mouth daily.   Marland Kitchen dofetilide (TIKOSYN) 125 MCG capsule Take 1 capsule (125 mcg total) by mouth every 12 (twelve) hours.  Marland Kitchen econazole nitrate 1 % cream Apply 1 application topically every other day.   . fenofibrate 160 MG tablet TAKE 1 TABLET(160 MG) BY MOUTH DAILY  . fluticasone (FLONASE) 50 MCG/ACT nasal spray Place 1 spray into both nostrils daily.   Marland Kitchen glucose blood (ONETOUCH VERIO) test strip Test once daily.  Dx E11.9  . hydrochlorothiazide (HYDRODIURIL) 25 MG tablet Take 1 tablet (25 mg total) by mouth daily.  . Insulin Glargine (TOUJEO SOLOSTAR) 300 UNIT/ML SOPN Inject 60 Units as directed daily.  Marland Kitchen JARDIANCE 25 MG TABS tablet TAKE 1 TABLET BY MOUTH DAILY  . LORazepam (ATIVAN) 0.5 MG tablet Take 1 tablet (0.5 mg total) by mouth every 8 (eight) hours as needed for anxiety.  Marland Kitchen losartan (COZAAR) 100 MG tablet Take 1 tablet (100 mg total) by mouth daily.  . metFORMIN (GLUCOPHAGE) 1000 MG tablet TAKE 1 TABLET BY MOUTH TWICE DAILY WITH A MEAL  . metoprolol (LOPRESSOR) 100 MG tablet Take 100 mg by mouth 2 (two) times daily.  . montelukast (SINGULAIR) 10 MG tablet TAKE 1 TABLET BY MOUTH AT BEDTIME  . Olopatadine HCl (PATADAY) 0.2 % SOLN Apply 1 drop to eye as needed.  . pantoprazole (PROTONIX) 40 MG tablet TAKE  1 TABLET BY MOUTH TWICE DAILY (Patient taking differently: TAKE 1 TABLET BY MOUTH  DAILY IN THE MORNING)  . potassium chloride SA (K-DUR,KLOR-CON) 20 MEQ tablet Take 2 tablets (40 mEq total) by mouth daily.  . pramipexole (MIRAPEX) 0.25 MG tablet TAKE 1 TABLET(0.25 MG) BY MOUTH AT BEDTIME  . ranitidine (ZANTAC) 150 MG capsule Take 150 mg by mouth every evening.  . simvastatin (ZOCOR) 80 MG tablet TAKE 1 TABLET BY MOUTH EVERY DAY  . SPIRIVA HANDIHALER 18 MCG inhalation capsule INHALE CONTENTS OF 1 CAPSULE ONCE DAILY USING  HANDIHALER  . XARELTO 20 MG TABS tablet TAKE 1 TABLET BY MOUTH DAILY WITH SUPPER  . Zoster Vaccine Adjuvanted [employer]) injection Shingrix (PF) 50 mcg/0.5 mL intramuscular suspension, kit  ADM 0.5ML IM UTD   Allergies  Allergen Reactions  . Amoxicillin Rash    * SEVERE RASH IN GROIN AREA Has patient had a PCN reaction causing immediate rash, facial/tongue/throat swelling, SOB or lightheadedness with hypotension: no Has patient had a PCN reaction causing severe rash involving mucus membranes or skin necrosis: no Has patient had a PCN reaction that required hospitalization: no Has patient had a PCN reaction occurring within the last 10 years: yes If all of the above answers are "NO", then may proceed with Cephalosporin use.   . Augmentin [Amoxicillin-Pot Clavulanate] Rash    * SEVERE RASH IN GROIN AREA  . Azithromycin Rash    * SEVERE RASH IN GROIN AREA  . Clindamycin/Lincomycin Rash  . Keflex [Cephalexin] Rash   Past Medical History:  Diagnosis Date  . Allergy   . Anxiety    history of PTSD following CABG  . Arthritis   . Asthma   . Colitis- colonoscopy 2014 07/13/2015  . COPD (chronic obstructive pulmonary disease) ([place])   . Coronary artery disease    x 6  . Depression   . Family history of polyps in the colon   . GERD (gastroesophageal reflux disease)   . Gout   . H/O atrial fibrillation without current medication    following CABG with no documented episodes since then.  Marland Kitchen Hx of adenomatous colonic polyps 08/12/2010  . Hyperlipidemia   . Hypertension   . OSA (obstructive sleep apnea)   . Prediabetes   . RLS (restless legs syndrome)   . 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 Colon cancer Mother   . Heart disease Paternal Grandmother   . Heart disease Paternal Grandfather    Past Surgical History:  Procedure Laterality Date  . APPENDECTOMY    . ATRIAL FIBRILLATION ABLATION N/A 03/31/2018   Procedure:  ATRIAL FIBRILLATION ABLATION;  Surgeon: [name], MD;  Location: [facility];  Service: Cardiovascular;  Laterality: N/A;  . COLONOSCOPY W/ BIOPSIES    . CORONARY ANGIOPLASTY WITH STENT PLACEMENT    . CORONARY ARTERY BYPASS GRAFT    . FINGER SURGERY  04/2018   Small finger left hand  . TEE WITHOUT CARDIOVERSION N/A 03/30/2018   Procedure: TRANSESOPHAGEAL ECHOCARDIOGRAM (TEE);  Surgeon: [name], MD;  Location: [facility];  Service: Cardiovascular;  Laterality: N/A;  . TOTAL HIP ARTHROPLASTY     Social History   Socioeconomic History  . Marital status: Married    Spouse name: Not on file  . Number of children: Not on file  . Years of education: Not on file  . Highest education level: Not on file  Occupational History  .  Occupation: retired  Scientific laboratory technician  . Financial resource strain: Not on file  . Food insecurity:    Worry: Not on file    Inability: Not on file  . Transportation needs:    Medical: Not on file    Non-medical: Not on file  Tobacco Use  . Smoking status: Former Smoker    Packs/day: 1.00    Years: 7.00    Pack years: 7.00    Types: Cigarettes    Last attempt to quit: 10/04/1977    Years since quitting: 40.8  . Smokeless tobacco: Never Used  . Tobacco comment: never smoked over 1 pack   Substance and Sexual Activity  . Alcohol use: Yes    Comment: stopped wine and now drinks 3 to 4 scotches   . Drug use: No    Comment: Smoked Marijuana back in 1970s  . Sexual activity: Yes  Lifestyle  . Physical activity:    Days per week: Not on file    Minutes per session: Not on file  . Stress: Not on file  Relationships  . Social connections:    Talks on phone: Not on file    Gets together: Not on file    Attends religious service: Not on file    Active member of club or organization: Not on file    Attends meetings of clubs or organizations: Not on file    Relationship status: Not on file  . Intimate partner violence:    Fear of current or ex  partner: Not on file    Emotionally abused: Not on file    Physically abused: Not on file    Forced sexual activity: Not on file  Other Topics Concern  . 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  Cardiovascular: negative for chest pain, dyspnea on exertion, edema, orthopnea, palpitations, paroxysmal nocturnal dyspnea or shortness of breath Dermatological: negative for rash Respiratory: negative for cough or wheezing Urologic: negative for hematuria Abdominal: negative for nausea, vomiting, diarrhea, bright red blood per rectum, melena, or hematemesis Neurologic: negative for visual changes, syncope, or dizziness All other systems reviewed and are otherwise negative except as noted above.   Physical Exam:  Blood pressure 126/72, pulse 92, height _0  (1.778 m), weight 261 lb 6.4 oz (118.6 kg), SpO2 95 %.  General appearance: alert, cooperative and no distress Neck: no carotid bruit and no JVD Lungs: clear to auscultation bilaterally Abdomen: soft, non-tender; bowel sounds normal; no masses,  no organomegaly Extremities: extremities normal, atraumatic, no cyanosis or edema Pulses: 2+ and symmetric Skin: Skin color, texture, turgor normal. No rashes or lesions Neurologic: Grossly normal  EKG NSR 92 -- personally reviewed   ASSESSMENT AND PLAN:   1. CAD: h/o CABG in 2011. NST in 2014 showed no ischemia.  He denies any anginal symptomatology. Continue medical therapy. Statin and BB. No ASA due to Xarelto for afib.   2. H/o persistent Atrial fibrillation: s/p recent cathter ablation by Dr. Rayann Heman June 2019. EKG today shows NSR. He denies any breakthrough symptoms. He is on Xarelto for a/c.   3. OSA: not yet on CPAP. Followed in sleep clinic by Dr. Radford Pax.  Has an upcoming appt to get set up for CPAP.  4. Hip OA: needing right total hip replacement. Tentatively scheduled for 09/12/18.   5. Preoperative Cardiac Evaluation: h/o CABG in 2011. NST in 2014 showed no ischemia.  He denies any anginal symptomatology. Using the DASI calculator, his functional capacity in METS is 4.61. He denies any exertional CP or dyspnea w/ activity. EKG non ischemic. Physical exam is benign. Based on assessment and guidelines, he can be cleared for hip replacement w/o need for further cardiac testing. He is of acceptable risk to proceed. Per pharmacy, patient can hold Xarelto for 3 days prior to procedure.  For orthopedic procedures please be sure to resume therapeutic (not prophylactic) dosing. I will fax clearance to Dr. Alvan Dame.    Follow-Up in EP clinic in Jan 2020 and sleep clinic in Jan 2020. Hopefully we will know more regarding Dr. Thurman Coyer plans to return to work at that time. If need be, we would be happy to continue general cardiology care for Mr. Rostad.   Brittainy Ladoris Gene, MHS [facility] 08/08/2018 11:45 AM

## 2018-08-08 NOTE — Telephone Encounter (Signed)
   Pt seen and examined today in general cardiology clinic for preop assessment.    Preoperative Cardiac Evaluation: h/o CABG in 2011. NST in 2014 showed no ischemia.  He denies any anginal symptomatology. Using the DASI calculator, his functional capacity in METS is 4.61. He denies any exertional CP or dyspnea w/ activity. EKG non ischemic. Physical exam is benign. Based on assessment and guidelines, he can be cleared for hip replacement w/o need for further cardiac testing. He is of acceptable risk to proceed. Per pharmacy, patient can hold Xarelto for 3 days prior to procedure.  For orthopedic procedures please be sure to resume therapeutic (not prophylactic) dosing. I will fax clearance to Dr. Alvan Dame.   Lyda Jester, PA-C 08/08/2018

## 2018-08-10 NOTE — Telephone Encounter (Signed)
Patient called per Adam [name] at [facility] today to delay his cpap set up on November 15 until he returns from his trip. Patient states he will call [facility] upon his return.

## 2018-08-12 ENCOUNTER — Other Ambulatory Visit: Payer: Self-pay | Admitting: Family Medicine

## 2018-08-17 ENCOUNTER — Telehealth: Payer: Self-pay | Admitting: Cardiology

## 2018-08-17 NOTE — Telephone Encounter (Signed)
Please let pt know we sent clearance 08/08/18, and I sent again today  Also please call [facility] and ask them if they have rec'd.

## 2018-08-17 NOTE — Telephone Encounter (Signed)
Spoke with pt , I notified him that we have resent clearance again today. Attempted to reach requesting office to see if they have received clearance but there was no answer, LVM on surgical schedulers Adam [name] VM instructing her to call back tomorrow to let us know if clearance was received

## 2018-08-17 NOTE — Telephone Encounter (Signed)
Follow up   Pt calling calling to check on the status of his clearance. Please call

## 2018-08-18 NOTE — Telephone Encounter (Signed)
Sheri, returned called, clearance was received.

## 2018-08-21 LAB — HM DIABETES EYE EXAM

## 2018-08-25 ENCOUNTER — Ambulatory Visit: Payer: Medicare Other | Admitting: Cardiology

## 2018-08-25 ENCOUNTER — Telehealth: Payer: Self-pay

## 2018-08-25 ENCOUNTER — Encounter: Payer: Self-pay | Admitting: Cardiology

## 2018-08-25 VITALS — BP 124/80 | HR 92 | Ht 70.0 in | Wt 262.6 lb

## 2018-08-25 DIAGNOSIS — I480 Paroxysmal atrial fibrillation: Secondary | ICD-10-CM

## 2018-08-25 NOTE — Patient Instructions (Signed)
Your physician recommends that you continue on your current medications as directed. Please refer to the Current Medication list given to you today.   Your physician recommends that you schedule a follow-up appointment in: AS PLANNED  

## 2018-08-25 NOTE — H&P (Signed)
TOTAL HIP ADMISSION H&P  Patient is admitted for right total hip arthroplasty, anterior approach.  Subjective:  Chief Complaint:    Right hip primary OA / pain  HPI: Adam [name], 67 y.o. male, has a history of pain and functional disability in the right hip(s) due to arthritis and patient has failed non-surgical conservative treatments for greater than 12 weeks to include NSAID's and/or analgesics, corticosteriod injections and activity modification.  Onset of symptoms was gradual starting <1 year ago with gradually worsening course since that time.The patient noted prior procedures of the hip to include arthroplasty on the left hip in 2013 while living in [place].  Patient currently rates pain in the right hip at 10 out of 10 with activity. Patient has worsening of pain with activity and weight bearing, trendelenberg gait, pain that interfers with activities of daily living and pain with passive range of motion. Patient has evidence of periarticular osteophytes and joint space narrowing by imaging studies. This condition presents safety issues increasing the risk of falls.  There is no current active infection.  Risks, benefits and expectations were discussed with the patient.  Risks including but not limited to the risk of anesthesia, blood clots, nerve damage, blood vessel damage, failure of the prosthesis, infection and up to and including death.  Patient understand the risks, benefits and expectations and wishes to proceed with surgery.    PCP: [name], MD  D/C Plans:       Home   Post-op Meds:       No Rx given   Tranexamic Acid:      To be given - IV   Decadron:      Is to be given  FYI:     Xarelto (on pre-op)  Norco  DME:   Rx given for - RW   PT:   No PT   Patient Active Problem List   Diagnosis Date Noted  . Sleep apnea 01/09/2018  . Persistent atrial fibrillation 12/12/2017  . Family history of colon cancer in mother deceased age 52 Jul 30, 2015  . Type  2 diabetes mellitus, controlled ([place]) 07/17/2013  . COPD (chronic obstructive pulmonary disease) ([place]) 07/05/2013  . CAD (coronary artery disease) 06/26/2013  . Essential hypertension, benign 06/26/2013  . Dyslipidemia 06/26/2013  . GERD (gastroesophageal reflux disease) 06/26/2013  . History of atrial fibrillation without current medication 06/26/2013  . Gout 06/26/2013  . Osteoarthritis 06/26/2013  . Asthma, mild persistent 06/26/2013  . Alcohol dependence ([place]) 06/26/2013  . PTSD (post-traumatic stress disorder) 06/26/2013  . HLA B27 (HLA B27 positive) 06/26/2013  . Obesity (BMI 30-39.9) 06/26/2013   Past Medical History:  Diagnosis Date  . Allergy   . Anxiety    history of PTSD following CABG  . Arthritis   . Asthma   . Colitis- colonoscopy 2014 2015/07/30  . COPD (chronic obstructive pulmonary disease) ([place])   . Coronary artery disease    x 6  . Depression   . Family history of polyps in the colon   . GERD (gastroesophageal reflux disease)   . Gout   . H/O atrial fibrillation without current medication    following CABG with no documented episodes since then.  Marland Kitchen Hx of adenomatous colonic polyps 08/12/2010  . Hyperlipidemia   . Hypertension   . OSA (obstructive sleep apnea)   . Prediabetes   . RLS (restless legs syndrome)   . 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 APPENDECTOMY    . ATRIAL FIBRILLATION ABLATION N/A 03/31/2018   Procedure: ATRIAL FIBRILLATION ABLATION;  Surgeon: [name], MD;  Location: [facility];  Service: Cardiovascular;  Laterality: N/A;  . COLONOSCOPY W/ BIOPSIES    . CORONARY ANGIOPLASTY WITH STENT PLACEMENT    . CORONARY ARTERY BYPASS GRAFT    . FINGER SURGERY  04/2018   Small finger left hand  . TEE WITHOUT CARDIOVERSION N/A 03/30/2018   Procedure: TRANSESOPHAGEAL ECHOCARDIOGRAM (TEE);  Surgeon: [name], MD;  Location: [facility];  Service: Cardiovascular;   Laterality: N/A;  . TOTAL HIP ARTHROPLASTY      No current facility-administered medications for this encounter.    Current Outpatient Medications  Medication Sig Dispense Refill Last Dose  . allopurinol (ZYLOPRIM) 300 MG tablet TAKE 1 TABLET BY MOUTH EVERY DAY 90 tablet 0   . BREO ELLIPTA 100-25 MCG/INH AEPB INHALE 1 PUFF INTO THE LUNGS DAILY 60 each 5 Taking  . Coenzyme Q10 (CO Q-10) 100 MG CAPS Take 100 mg by mouth daily.    Taking  . dofetilide (TIKOSYN) 125 MCG capsule Take 1 capsule (125 mcg total) by mouth every 12 (twelve) hours. 60 capsule 2 Taking  . econazole nitrate 1 % cream Apply 1 application topically every other day.    Taking  . fenofibrate 160 MG tablet TAKE 1 TABLET(160 MG) BY MOUTH DAILY 90 tablet 0 Taking  . fluticasone (FLONASE) 50 MCG/ACT nasal spray Place 1 spray into both nostrils daily.    Taking  . glucose blood (ONETOUCH VERIO) test strip Test once daily.  Dx E11.9 100 each 3 Taking  . hydrochlorothiazide (HYDRODIURIL) 25 MG tablet Take 1 tablet (25 mg total) by mouth daily. 90 tablet 3 Taking  . Insulin Glargine (TOUJEO SOLOSTAR) 300 UNIT/ML SOPN Inject 60 Units as directed daily. 22.5 pen 0 Taking  . JARDIANCE 25 MG TABS tablet TAKE 1 TABLET BY MOUTH DAILY 90 tablet 0 Taking  . LORazepam (ATIVAN) 0.5 MG tablet Take 1 tablet (0.5 mg total) by mouth every 8 (eight) hours as needed for anxiety. 90 tablet 5 Taking  . losartan (COZAAR) 100 MG tablet Take 1 tablet (100 mg total) by mouth daily. 90 tablet 3 Taking  . metFORMIN (GLUCOPHAGE) 1000 MG tablet TAKE 1 TABLET BY MOUTH TWICE DAILY WITH A MEAL 180 tablet 0   . metoprolol (LOPRESSOR) 100 MG tablet Take 100 mg by mouth 2 (two) times daily.   Taking  . montelukast (SINGULAIR) 10 MG tablet TAKE 1 TABLET BY MOUTH AT BEDTIME 90 tablet 0   . Olopatadine HCl (PATADAY) 0.2 % SOLN Apply 1 drop to eye as needed. 2.5 mL 5 Taking  . pantoprazole (PROTONIX) 40 MG tablet TAKE 1 TABLET BY MOUTH TWICE DAILY 180 tablet 0   .  potassium chloride SA (K-DUR,KLOR-CON) 20 MEQ tablet Take 2 tablets (40 mEq total) by mouth daily. 60 tablet 12 Taking  . pramipexole (MIRAPEX) 0.25 MG tablet TAKE 1 TABLET(0.25 MG) BY MOUTH AT BEDTIME 90 tablet 1 Taking  . ranitidine (ZANTAC) 150 MG capsule Take 150 mg by mouth every evening.   Taking  . simvastatin (ZOCOR) 80 MG tablet TAKE 1 TABLET BY MOUTH EVERY DAY 90 tablet 0 Taking  . SPIRIVA HANDIHALER 18 MCG inhalation capsule INHALE CONTENTS OF 1 CAPSULE ONCE DAILY USING HANDIHALER 90 capsule 1 Taking  . XARELTO 20 MG TABS tablet TAKE 1 TABLET BY MOUTH DAILY WITH SUPPER 30 tablet 3 Taking  .  Zoster Vaccine Adjuvanted [facility]) injection Shingrix (PF) 50 mcg/0.5 mL intramuscular suspension, kit  ADM 0.5ML IM UTD   Taking   Allergies  Allergen Reactions  . Amoxicillin Rash    * SEVERE RASH IN GROIN AREA Has patient had a PCN reaction causing immediate rash, facial/tongue/throat swelling, SOB or lightheadedness with hypotension: no Has patient had a PCN reaction causing severe rash involving mucus membranes or skin necrosis: no Has patient had a PCN reaction that required hospitalization: no Has patient had a PCN reaction occurring within the last 10 years: yes If all of the above answers are "NO", then may proceed with Cephalosporin use.   . Augmentin [Amoxicillin-Pot Clavulanate] Rash    * SEVERE RASH IN GROIN AREA  . Azithromycin Rash    * SEVERE RASH IN GROIN AREA  . Clindamycin/Lincomycin Rash  . Keflex [Cephalexin] Rash    Social History   Tobacco Use  . Smoking status: Former Smoker    Packs/day: 1.00    Years: 7.00    Pack years: 7.00    Types: Cigarettes    Last attempt to quit: 10/04/1977    Years since quitting: 40.9  . Smokeless tobacco: Never Used  . Tobacco comment: never smoked over 1 pack   Substance Use Topics  . Alcohol use: Yes    Comment: stopped wine and now drinks 3 to 4 scotches     Family History  Problem Relation Age of Onset  . Colon cancer  Mother   . Heart disease Paternal Grandmother   . Heart disease Paternal Grandfather      Review of Systems  Constitutional: Negative.   HENT: Negative.   Eyes: Negative.   Respiratory: Negative.   Cardiovascular: Negative.        A-fib  Gastrointestinal: Positive for heartburn.  Genitourinary: Negative.   Musculoskeletal: Positive for joint pain.  Skin: Negative.   Neurological: Negative.   Endo/Heme/Allergies: Negative.   Psychiatric/Behavioral: Positive for depression. The patient is nervous/anxious.     Objective:  Physical Exam  Constitutional: He is oriented to person, place, and time. He appears well-developed.  HENT:  Head: Normocephalic.  Eyes: Pupils are equal, round, and reactive to light.  Neck: Neck supple. No JVD present. No tracheal deviation present. No thyromegaly present.  Cardiovascular: Normal rate, regular rhythm and intact distal pulses.  Respiratory: Effort normal and breath sounds normal. No respiratory distress. He has no wheezes.  GI: Soft. There is no tenderness. There is no guarding.  Musculoskeletal:       Right hip: He exhibits decreased range of motion, decreased strength, tenderness and bony tenderness. He exhibits no swelling, no deformity and no laceration.  Lymphadenopathy:    He has no cervical adenopathy.  Neurological: He is alert and oriented to person, place, and time.  Skin: Skin is warm and dry.  Psychiatric: He has a normal mood and affect.      Labs:  Estimated body mass index is 37.51 kg/m as calculated from the following:   Height as of 08/08/18: '5\' 10"'  (1.778 m).   Weight as of 08/08/18: 118.6 kg.   Imaging Review Plain radiographs demonstrate severe degenerative joint disease of the right hip. The bone quality appears to be good for age and reported activity level.    Preoperative templating of the joint replacement has been completed, documented, and submitted to the Operating Room personnel in order to optimize  intra-operative equipment management.     Assessment/Plan:  End stage arthritis, right hip  The patient history, physical examination, clinical judgement of the provider and imaging studies are consistent with end stage degenerative joint disease of the right hip and total hip arthroplasty is deemed medically necessary. The treatment options including medical management, injection therapy, arthroscopy and arthroplasty were discussed at length. The risks and benefits of total hip arthroplasty were presented and reviewed. The risks due to aseptic loosening, infection, stiffness, dislocation/subluxation,  thromboembolic complications and other imponderables were discussed.  The patient acknowledged the explanation, agreed to proceed with the plan and consent was signed. Patient is being admitted for inpatient treatment for surgery, pain control, PT, OT, prophylactic antibiotics, VTE prophylaxis, progressive ambulation and ADL's and discharge planning.The patient is planning to be discharged home.    [place] Pugh Katelan Hirt   PA-C  08/25/2018, 1:18 PM

## 2018-08-25 NOTE — Telephone Encounter (Addendum)
Left message for patient to call back. To see if he could come in for OV today with Ellen Henri PA.   Per Ellen Henri PA, Pt think he is back in afib. Can we get OV? He was asking for an EKG. ? Nursing visit.

## 2018-08-25 NOTE — Progress Notes (Signed)
08/25/2018 Adam [name]   Feb 14, 1951  [id number]  Primary Physician Elease Hashimoto, Alinda Sierras, MD Primary Cardiologist: Dr. Wynonia Lawman  Electrophysiologist: Dr. Rayann Heman  Sleep Clinic: Dr. Radford Pax   Reason for Visit/CC: Atrial Fibrillation/ Palpitations   HPI:  Adam [name] is a 67 y.o. male who is being seen today for palpitations.  His primary cardiologist is Dr. Wynonia Lawman.  He is followed by Dr. Rayann Heman in the EP clinic.  He has a past medical history which includes CABG in 2011, HTN, HLD, Atrial fibrillation, and OSA. He underwent recent catheter ablation for atrial fibrillation by Dr. Rayann Heman 03/31/18. He is on Tikosyn and Xarelto.  He is followed by Dr. Radford Pax in sleep clinic, but not yet started on CPAP. He had an echo in January of this year that showed normal LVEF and no significant valvular abnormalities. Stress test in 2013 showed no ischemia.    I saw him earlier this month August 08, 2018 for preoperative cardiovascular examination for anticipated right total hip replacement for severe hip OA.  At that time, he denied any anginal symptomatology.  Able to complete greater than 4 METS of physical activity without any exertional chest pain or dyspnea.  Patient was cleared for hip replacement surgery from cardiac standpoint.  This has not yet been done.  It is scheduled for September 12, 2018 with Dr. Alvan Dame.  Patient contacted the office yesterday due to palpitations that have occurring on for the last 2 days.  Patient reports he felt like he was back in atrial fibrillation.  He reports full compliance with his medications including metoprolol and Tikosyn and also fully compliant with Xarelto.  He denies any chest pain or dyspnea.  Reports he has been staying well-hydrated with fluids and denies any caffeine or alcohol.  He reports that symptoms resolved today.  No further palpitations.  EKG shows normal sinus rhythm.  Heart rate 92 bpm.  Blood pressure 124/80.  Current Meds  Medication Sig  .  allopurinol (ZYLOPRIM) 300 MG tablet TAKE 1 TABLET BY MOUTH EVERY DAY  . BREO ELLIPTA 100-25 MCG/INH AEPB INHALE 1 PUFF INTO THE LUNGS DAILY  . Coenzyme Q10 (CO Q-10) 100 MG CAPS Take 100 mg by mouth daily.   Marland Kitchen dofetilide (TIKOSYN) 125 MCG capsule Take 1 capsule (125 mcg total) by mouth every 12 (twelve) hours.  Marland Kitchen econazole nitrate 1 % cream Apply 1 application topically every other day.   . fenofibrate 160 MG tablet TAKE 1 TABLET(160 MG) BY MOUTH DAILY  . fluticasone (FLONASE) 50 MCG/ACT nasal spray Place 1 spray into both nostrils daily.   Marland Kitchen glucose blood (ONETOUCH VERIO) test strip Test once daily.  Dx E11.9  . hydrochlorothiazide (HYDRODIURIL) 25 MG tablet Take 1 tablet (25 mg total) by mouth daily.  . Insulin Glargine (TOUJEO SOLOSTAR) 300 UNIT/ML SOPN Inject 60 Units as directed daily.  Marland Kitchen JARDIANCE 25 MG TABS tablet TAKE 1 TABLET BY MOUTH DAILY  . LORazepam (ATIVAN) 0.5 MG tablet Take 1 tablet (0.5 mg total) by mouth every 8 (eight) hours as needed for anxiety.  Marland Kitchen losartan (COZAAR) 100 MG tablet Take 1 tablet (100 mg total) by mouth daily.  . metFORMIN (GLUCOPHAGE) 1000 MG tablet TAKE 1 TABLET BY MOUTH TWICE DAILY WITH A MEAL  . metoprolol (LOPRESSOR) 100 MG tablet Take 100 mg by mouth 2 (two) times daily.  . montelukast (SINGULAIR) 10 MG tablet TAKE 1 TABLET BY MOUTH AT BEDTIME  . Olopatadine HCl (PATADAY) 0.2 % SOLN Apply 1 drop  to eye as needed.  . pantoprazole (PROTONIX) 40 MG tablet TAKE 1 TABLET BY MOUTH TWICE DAILY  . potassium chloride SA (K-DUR,KLOR-CON) 20 MEQ tablet Take 2 tablets (40 mEq total) by mouth daily.  . pramipexole (MIRAPEX) 0.25 MG tablet TAKE 1 TABLET(0.25 MG) BY MOUTH AT BEDTIME  . ranitidine (ZANTAC) 150 MG capsule Take 150 mg by mouth every evening.  . simvastatin (ZOCOR) 80 MG tablet TAKE 1 TABLET BY MOUTH EVERY DAY  . SPIRIVA HANDIHALER 18 MCG inhalation capsule INHALE CONTENTS OF 1 CAPSULE ONCE DAILY USING HANDIHALER  . XARELTO 20 MG TABS tablet TAKE 1  TABLET BY MOUTH DAILY WITH SUPPER  . Zoster Vaccine Adjuvanted [facility]) injection Shingrix (PF) 50 mcg/0.5 mL intramuscular suspension, kit  ADM 0.5ML IM UTD   Allergies  Allergen Reactions  . Amoxicillin Rash    * SEVERE RASH IN GROIN AREA Has patient had a PCN reaction causing immediate rash, facial/tongue/throat swelling, SOB or lightheadedness with hypotension: no Has patient had a PCN reaction causing severe rash involving mucus membranes or skin necrosis: no Has patient had a PCN reaction that required hospitalization: no Has patient had a PCN reaction occurring within the last 10 years: yes If all of the above answers are "NO", then may proceed with Cephalosporin use.   . Augmentin [Amoxicillin-Pot Clavulanate] Rash    * SEVERE RASH IN GROIN AREA  . Azithromycin Rash    * SEVERE RASH IN GROIN AREA  . Clindamycin/Lincomycin Rash  . Keflex [Cephalexin] Rash   Past Medical History:  Diagnosis Date  . Allergy   . Anxiety    history of PTSD following CABG  . Arthritis   . Asthma   . Colitis- colonoscopy 2014 07/13/2015  . COPD (chronic obstructive pulmonary disease) ([place])   . Coronary artery disease    x 6  . Depression   . Family history of polyps in the colon   . GERD (gastroesophageal reflux disease)   . Gout   . H/O atrial fibrillation without current medication    following CABG with no documented episodes since then.  Marland Kitchen Hx of adenomatous colonic polyps 08/12/2010  . Hyperlipidemia   . Hypertension   . OSA (obstructive sleep apnea)   . Prediabetes   . RLS (restless legs syndrome)   . 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 Colon cancer Mother   . Heart disease Paternal Grandmother   . Heart disease Paternal Grandfather    Past Surgical History:  Procedure Laterality Date  . APPENDECTOMY    . ATRIAL FIBRILLATION ABLATION N/A 03/31/2018   Procedure: ATRIAL FIBRILLATION ABLATION;  Surgeon: [name], MD;  Location: [facility];  Service: Cardiovascular;  Laterality: N/A;  . COLONOSCOPY W/ BIOPSIES    . CORONARY ANGIOPLASTY WITH STENT PLACEMENT    . CORONARY ARTERY BYPASS GRAFT    . FINGER SURGERY  04/2018   Small finger left hand  . TEE WITHOUT CARDIOVERSION N/A 03/30/2018   Procedure: TRANSESOPHAGEAL ECHOCARDIOGRAM (TEE);  Surgeon: [name], MD;  Location: [facility];  Service: Cardiovascular;  Laterality: N/A;  . TOTAL HIP ARTHROPLASTY     Social History   Socioeconomic History  . Marital status: Married    Spouse name: Not on file  . Number of children: Not on file  . Years of education: Not on file  . Highest education level: Not on file  Occupational History  .  Occupation: retired  Scientific laboratory technician  . Financial resource strain: Not on file  . Food insecurity:    Worry: Not on file    Inability: Not on file  . Transportation needs:    Medical: Not on file    Non-medical: Not on file  Tobacco Use  . Smoking status: Former Smoker    Packs/day: 1.00    Years: 7.00    Pack years: 7.00    Types: Cigarettes    Last attempt to quit: 10/04/1977    Years since quitting: 40.9  . Smokeless tobacco: Never Used  . Tobacco comment: never smoked over 1 pack   Substance and Sexual Activity  . Alcohol use: Yes    Comment: stopped wine and now drinks 3 to 4 scotches   . Drug use: No    Comment: Smoked Marijuana back in 1970s  . Sexual activity: Yes  Lifestyle  . Physical activity:    Days per week: Not on file    Minutes per session: Not on file  . Stress: Not on file  Relationships  . Social connections:    Talks on phone: Not on file    Gets together: Not on file    Attends religious service: Not on file    Active member of club or organization: Not on file    Attends meetings of clubs or organizations: Not on file    Relationship status: Not on file  . Intimate partner violence:    Fear of current or ex partner: Not on file    Emotionally abused:  Not on file    Physically abused: Not on file    Forced sexual activity: Not on file  Other Topics Concern  . 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  Cardiovascular: negative for chest pain, dyspnea on exertion, edema, orthopnea, palpitations, paroxysmal nocturnal dyspnea or shortness of breath Dermatological: negative for rash Respiratory: negative for cough or wheezing Urologic: negative for hematuria Abdominal: negative for nausea, vomiting, diarrhea, bright red blood per rectum, melena, or hematemesis Neurologic: negative for visual changes, syncope, or dizziness All other systems reviewed and are otherwise negative except as noted above.   Physical Exam:  Blood pressure 124/80, pulse 92, height 5' 10" (1.778 m), weight 262 lb 9.6 oz (119.1 kg), SpO2 95 %.  General appearance: alert, cooperative and no distress Neck: no carotid bruit and no JVD Lungs: clear to auscultation bilaterally Heart: regular rate and rhythm, S1, S2 normal, no murmur, click, rub or gallop Extremities: extremities normal, atraumatic, no cyanosis or edema Pulses: 2+ and symmetric Skin: Skin color, texture, turgor normal. No rashes or lesions Neurologic: Grossly normal  EKG NSR 92 bpm -- personally reviewed   ASSESSMENT AND PLAN:   1. H/o persistent Atrial fibrillation: s/p recent cathter ablation by Dr. Rayann Heman June 2019.  2 days ago he felt palpitations similar to the prior A. fib symptoms.  This his since resolved.  EKG today shows normal sinus rhythm.  He is feeling better.  He reports full compliance with metoprolol, Tikosyn and Xarelto.  He will continue current dose of metoprolol 100 mg twice daily and current dose of Tikosyn.  If symptoms return, he can try taking an extra  half tablet  of metoprolol and patient instructed to notify Dr. Rayann Heman if frequent recurrence.  He was also advised to avoid triggers and to try to stay well-hydrated.   2. CAD: h/o CABG in 2011. NST in 2014 showed no ischemia.  He denies any anginal symptomatology. Continue medical therapy. Statin and BB. No ASA due to Xarelto for afib.   3. OSA: not yet on CPAP. Followed in sleep clinic by Dr. Radford Pax. Has an upcoming appt to get set up for CPAP.   4. Hip OA: needing right total hip replacement. Tentatively scheduled for 09/12/18. He was cleared by cardiology on 08/08/18.   Follow-Up: keep f/u as planned w/ Dr. Rayann Heman 10/2018.   Brittainy Ladoris Gene, MHS [facility] 08/25/2018 2:20 PM

## 2018-08-28 NOTE — Telephone Encounter (Signed)
Pt saw B. Rosita Fire, [place] on 08/25/18

## 2018-08-30 ENCOUNTER — Encounter: Payer: Self-pay | Admitting: Family Medicine

## 2018-09-06 ENCOUNTER — Encounter (HOSPITAL_COMMUNITY): Payer: Self-pay

## 2018-09-06 NOTE — Pre-Procedure Instructions (Signed)
The following are in epic:  Cardiac clearance Rosita Fire, P.A. 08/08/2018 Last office visit note Curley Spice 08/25/2018 EKG 08/25/2018

## 2018-09-06 NOTE — Patient Instructions (Addendum)
Your procedure is scheduled on: Tuesday, Dec. 10, 2019   Surgery Time:  2:20PM-3:30PM   Report to [facility] Main  Entrance    Report to admitting at 11:45 AM   Call this number if you have problems the morning of surgery [phone]   Bring CPAP mask and tubing day of surgery   Do not eat food:After Midnight.   May have liquids until 8:00AM day of surgery  CLEAR LIQUID DIET  Foods Allowed                                                                     Foods Excluded  Water, Black Coffee and tea, regular and decaf                             liquids that you cannot  Plain Jell-O in any flavor                                             see through such as: Fruit ices (not with fruit pulp)                                     milk, soups, orange juice  Iced Popsicles                                    All solid food Carbonated beverages, regular and diet                                    Cranberry, grape and apple juices Sports drinks like Gatorade Lightly seasoned clear broth or consume(fat free) Sugar, honey syrup  Sample Menu Breakfast                                Lunch                                     Supper Cranberry juice                    Beef broth                            Chicken broth Jell-O                                     Grape juice                           Apple juice Coffee or tea  Jell-O                                      Popsicle                                                Coffee or tea                        Coffee or tea   Brush your teeth the morning of surgery.   Do NOT smoke after Midnight   Take these medicines the morning of surgery with A SIP OF WATER: Allopurinol, Dofetilide, Fenofibrate, Metoprolol, Pantoprazole,  Lorazepam if needed   Take normal does of Toujeo the morning before surgery   Take half (1/2) dose of Toujeo at dinner/bedtime the night before surgery   Take half (1/2) dose  of Toujeo the morning of surgery   Use inhaler and  flonase per normal routine morning of surgery   Use eye drops if needed  DO NOT TAKE ANY DIABETIC MEDICATIONS DAY OF YOUR SURGERY                               You may not have any metal on your body including jewelry, and body piercings             Do not wear lotions, powders, perfumes/cologne, or deodorant                          Men may shave face and neck.   Do not bring valuables to the hospital. [place].   Contacts, dentures or bridgework may not be worn into surgery.   Leave suitcase in the car. After surgery it may be brought to your room.    Special Instructions: Bring a copy of your healthcare power of attorney and living will documents         the day of surgery if you haven't scanned them in before.              Please read over the following fact sheets you were given:  [facility] - Preparing for Surgery Before surgery, you can play an important role.  Because skin is not sterile, your skin needs to be as free of germs as possible.  You can reduce the number of germs on your skin by washing with CHG (chlorahexidine gluconate) soap before surgery.  CHG is an antiseptic cleaner which kills germs and bonds with the skin to continue killing germs even after washing. Please DO NOT use if you have an allergy to CHG or antibacterial soaps.  If your skin becomes reddened/irritated stop using the CHG and inform your nurse when you arrive at Short Stay. Do not shave (including legs and underarms) for at least 48 hours prior to the first CHG shower.  You may shave your face/neck.  Please follow these instructions carefully:  1.  Shower with CHG Soap the night before surgery and the  morning of surgery.  2.  If you choose to wash your hair,  wash your hair first as usual with your normal  shampoo.  3.  After you shampoo, rinse your hair and body thoroughly to remove the shampoo.                              4.  Use CHG as you would any other liquid soap.  You can apply chg directly to the skin and wash.  Gently with a scrungie or clean washcloth.  5.  Apply the CHG Soap to your body ONLY FROM THE NECK DOWN.   Do   not use on face/ open                           Wound or open sores. Avoid contact with eyes, ears mouth and   genitals (private parts).                       Wash face,  Genitals (private parts) with your normal soap.             6.  Wash thoroughly, paying special attention to the area where your    surgery  will be performed.  7.  Thoroughly rinse your body with warm water from the neck down.  8.  DO NOT shower/wash with your normal soap after using and rinsing off the CHG Soap.                9.  Pat yourself dry with a clean towel.            10.  Wear clean pajamas.            11.  Place clean sheets on your bed the night of your first shower and do not  sleep with pets. Day of Surgery : Do not apply any lotions/deodorants the morning of surgery.  Please wear clean clothes to the hospital/surgery center.  FAILURE TO FOLLOW THESE INSTRUCTIONS MAY RESULT IN THE CANCELLATION OF YOUR SURGERY  PATIENT SIGNATURE_________________________________  NURSE SIGNATURE__________________________________  ________________________________________________________________________   Adam [name]  An incentive spirometer is a tool that can help keep your lungs clear and active. This tool measures how well you are filling your lungs with each breath. Taking long deep breaths may help reverse or decrease the chance of developing breathing (pulmonary) problems (especially infection) following:  A long period of time when you are unable to move or be active. BEFORE THE PROCEDURE   If the spirometer includes an indicator to show your best effort, your nurse or respiratory therapist will set it to a desired goal.  If possible, sit up straight or lean slightly  forward. Try not to slouch.  Hold the incentive spirometer in an upright position. INSTRUCTIONS FOR USE  1. Sit on the edge of your bed if possible, or sit up as far as you can in bed or on a chair. 2. Hold the incentive spirometer in an upright position. 3. Breathe out normally. 4. Place the mouthpiece in your mouth and seal your lips tightly around it. 5. Breathe in slowly and as deeply as possible, raising the piston or the ball toward the top of the column. 6. Hold your breath for 3-5 seconds or for as long as possible. Allow the piston or ball to fall to the bottom of the column. 7. Remove the mouthpiece from your mouth and breathe out normally. 8. Rest for  a few seconds and repeat Steps 1 through 7 at least 10 times every 1-2 hours when you are awake. Take your time and take a few normal breaths between deep breaths. 9. The spirometer may include an indicator to show your best effort. Use the indicator as a goal to work toward during each repetition. 10. After each set of 10 deep breaths, practice coughing to be sure your lungs are clear. If you have an incision (the cut made at the time of surgery), support your incision when coughing by placing a pillow or rolled up towels firmly against it. Once you are able to get out of bed, walk around indoors and cough well. You may stop using the incentive spirometer when instructed by your caregiver.  RISKS AND COMPLICATIONS  Take your time so you do not get dizzy or light-headed.  If you are in pain, you may need to take or ask for pain medication before doing incentive spirometry. It is harder to take a deep breath if you are having pain. AFTER USE  Rest and breathe slowly and easily.  It can be helpful to keep track of a log of your progress. Your caregiver can provide you with a simple table to help with this. If you are using the spirometer at home, follow these instructions: [place] IF:   You are having difficultly using the  spirometer.  You have trouble using the spirometer as often as instructed.  Your pain medication is not giving enough relief while using the spirometer.  You develop fever of 100.5 F (38.1 C) or higher. SEEK IMMEDIATE MEDICAL CARE IF:   You cough up bloody sputum that had not been present before.  You develop fever of 102 F (38.9 C) or greater.  You develop worsening pain at or near the incision site. MAKE SURE YOU:   Understand these instructions.  Will watch your condition.  Will get help right away if you are not doing well or get worse. Document Released: 01/31/2007 Document Revised: 12/13/2011 Document Reviewed: 04/03/2007 ExitCare Patient Information 2014 Exit[place], [place].   ________________________________________________________________________  WHAT IS A BLOOD TRANSFUSION? Blood Transfusion Information  A transfusion is the replacement of blood or some of its parts. Blood is made up of multiple cells which provide different functions.  Red blood cells carry oxygen and are used for blood loss replacement.  White blood cells fight against infection.  Platelets control bleeding.  Plasma helps clot blood.  Other blood products are available for specialized needs, such as hemophilia or other clotting disorders. BEFORE THE TRANSFUSION  Who gives blood for transfusions?   Healthy volunteers who are fully evaluated to make sure their blood is safe. This is blood bank blood. Transfusion therapy is the safest it has ever been in the practice of medicine. Before blood is taken from a donor, a complete history is taken to make sure that person has no history of diseases nor engages in risky social behavior (examples are intravenous drug use or sexual activity with multiple partners). The donor's travel history is screened to minimize risk of transmitting infections, such as malaria. The donated blood is tested for signs of infectious diseases, such as HIV and hepatitis.  The blood is then tested to be sure it is compatible with you in order to minimize the chance of a transfusion reaction. If you or a relative donates blood, this is often done in anticipation of surgery and is not appropriate for emergency situations. It takes many days  to process the donated blood. RISKS AND COMPLICATIONS Although transfusion therapy is very safe and saves many lives, the main dangers of transfusion include:   Getting an infectious disease.  Developing a transfusion reaction. This is an allergic reaction to something in the blood you were given. Every precaution is taken to prevent this. The decision to have a blood transfusion has been considered carefully by your caregiver before blood is given. Blood is not given unless the benefits outweigh the risks. AFTER THE TRANSFUSION  Right after receiving a blood transfusion, you will usually feel much better and more energetic. This is especially true if your red blood cells have gotten low (anemic). The transfusion raises the level of the red blood cells which carry oxygen, and this usually causes an energy increase.  The nurse administering the transfusion will monitor you carefully for complications. HOME CARE INSTRUCTIONS  No special instructions are needed after a transfusion. You may find your energy is better. Speak with your caregiver about any limitations on activity for underlying diseases you may have. SEEK MEDICAL CARE IF:   Your condition is not improving after your transfusion.  You develop redness or irritation at the intravenous (IV) site. SEEK IMMEDIATE MEDICAL CARE IF:  Any of the following symptoms occur over the next 12 hours:  Shaking chills.  You have a temperature by mouth above 102 F (38.9 C), not controlled by medicine.  Chest, back, or muscle pain.  People around you feel you are not acting correctly or are confused.  Shortness of breath or difficulty breathing.  Dizziness and fainting.  You  get a rash or develop hives.  You have a decrease in urine output.  Your urine turns a dark color or changes to pink, red, or brown. Any of the following symptoms occur over the next 10 days:  You have a temperature by mouth above 102 F (38.9 C), not controlled by medicine.  Shortness of breath.  Weakness after normal activity.  The white part of the eye turns yellow (jaundice).  You have a decrease in the amount of urine or are urinating less often.  Your urine turns a dark color or changes to pink, red, or brown. Document Released: 09/17/2000 Document Revised: 12/13/2011 Document Reviewed: 05/06/2008 [facility] Patient Information 2014 [place], [place].  _______________________________________________________________________

## 2018-09-07 ENCOUNTER — Encounter: Payer: Self-pay | Admitting: Primary Care

## 2018-09-07 ENCOUNTER — Encounter (HOSPITAL_COMMUNITY): Payer: Self-pay

## 2018-09-07 ENCOUNTER — Encounter (HOSPITAL_COMMUNITY)
Admission: RE | Admit: 2018-09-07 | Discharge: 2018-09-07 | Disposition: A | Discharge status: Home / Self Care | Payer: Medicare Other | Source: Ambulatory Visit | Attending: Orthopedic Surgery | Admitting: Orthopedic Surgery

## 2018-09-07 ENCOUNTER — Ambulatory Visit (INDEPENDENT_AMBULATORY_CARE_PROVIDER_SITE_OTHER): Payer: Medicare Other | Admitting: Primary Care

## 2018-09-07 ENCOUNTER — Other Ambulatory Visit: Payer: Self-pay

## 2018-09-07 VITALS — BP 116/60 | HR 94 | Temp 97.6°F | Ht 70.5 in | Wt 261.8 lb

## 2018-09-07 DIAGNOSIS — M25551 Pain in right hip: Secondary | ICD-10-CM | POA: Insufficient documentation

## 2018-09-07 DIAGNOSIS — M1611 Unilateral primary osteoarthritis, right hip: Secondary | ICD-10-CM | POA: Insufficient documentation

## 2018-09-07 DIAGNOSIS — Z01818 Encounter for other preprocedural examination: Secondary | ICD-10-CM | POA: Diagnosis not present

## 2018-09-07 DIAGNOSIS — Z794 Long term (current) use of insulin: Secondary | ICD-10-CM | POA: Insufficient documentation

## 2018-09-07 DIAGNOSIS — Z7901 Long term (current) use of anticoagulants: Secondary | ICD-10-CM | POA: Diagnosis not present

## 2018-09-07 DIAGNOSIS — J209 Acute bronchitis, unspecified: Secondary | ICD-10-CM | POA: Diagnosis not present

## 2018-09-07 DIAGNOSIS — J440 Chronic obstructive pulmonary disease with acute lower respiratory infection: Secondary | ICD-10-CM | POA: Diagnosis not present

## 2018-09-07 DIAGNOSIS — E119 Type 2 diabetes mellitus without complications: Secondary | ICD-10-CM | POA: Insufficient documentation

## 2018-09-07 DIAGNOSIS — J441 Chronic obstructive pulmonary disease with (acute) exacerbation: Secondary | ICD-10-CM

## 2018-09-07 DIAGNOSIS — Z79899 Other long term (current) drug therapy: Secondary | ICD-10-CM | POA: Insufficient documentation

## 2018-09-07 HISTORY — DX: Unspecified osteoarthritis, unspecified site: M19.90

## 2018-09-07 HISTORY — DX: Unspecified dislocation of unspecified finger, initial encounter: S63.259A

## 2018-09-07 HISTORY — DX: Thoracic aortic aneurysm, without rupture: I71.2

## 2018-09-07 HISTORY — DX: Cardiomegaly: I51.7

## 2018-09-07 HISTORY — DX: Type 2 diabetes mellitus without complications: E11.9

## 2018-09-07 HISTORY — DX: Palpitations: R00.2

## 2018-09-07 LAB — HEMOGLOBIN A1C
HEMOGLOBIN A1C: 6.1 % — AB (ref 4.8–5.6)
Mean Plasma Glucose: 128.37 mg/dL

## 2018-09-07 LAB — CBC
HEMATOCRIT: 43.3 % (ref 39.0–52.0)
Hemoglobin: 14.9 g/dL (ref 13.0–17.0)
MCH: 34.7 pg — ABNORMAL HIGH (ref 26.0–34.0)
MCHC: 34.4 g/dL (ref 30.0–36.0)
MCV: 100.7 fL — ABNORMAL HIGH (ref 80.0–100.0)
PLATELETS: 185 10*3/uL (ref 150–400)
RBC: 4.3 MIL/uL (ref 4.22–5.81)
RDW: 12.5 % (ref 11.5–15.5)
WBC: 6.6 10*3/uL (ref 4.0–10.5)
nRBC: 0 % (ref 0.0–0.2)

## 2018-09-07 LAB — BASIC METABOLIC PANEL
ANION GAP: 14 (ref 5–15)
BUN: 11 mg/dL (ref 8–23)
CALCIUM: 9.9 mg/dL (ref 8.9–10.3)
CHLORIDE: 95 mmol/L — AB (ref 98–111)
CO2: 26 mmol/L (ref 22–32)
Creatinine, Ser: 0.95 mg/dL (ref 0.61–1.24)
GFR calc non Af Amer: >60 mL/min (ref >60)
Glucose, Bld: 132 mg/dL — ABNORMAL HIGH (ref 70–99)
Potassium: 3.9 mmol/L (ref 3.5–5.1)
SODIUM: 135 mmol/L (ref 135–145)

## 2018-09-07 LAB — ABO/RH: ABO/RH(D): O POS

## 2018-09-07 LAB — SURGICAL PCR SCREEN
MRSA, PCR: NEGATIVE
Staphylococcus aureus: NEGATIVE

## 2018-09-07 LAB — GLUCOSE, CAPILLARY: Glucose-Capillary: 163 mg/dL — ABNORMAL HIGH (ref 70–99)

## 2018-09-07 MED ORDER — DOXYCYCLINE HYCLATE 100 MG PO TABS: 100.0000 mg | ORAL_TABLET | Freq: Two times a day (BID) | ORAL | 0 refills | Status: DC

## 2018-09-07 MED ORDER — HYDROCODONE-HOMATROPINE 5-1.5 MG/5ML PO SYRP: 5.0000 mL | ORAL_SOLUTION | Freq: Every evening | ORAL | 0 refills | Status: DC | PRN

## 2018-09-07 NOTE — Assessment & Plan Note (Addendum)
Complains of cough x 2 days  COPD does not appear exacerbated  Will check CXR today Instructed to take mucinex twice daily and delsym cough syrup RX for doxycyline sent to [place] [facility]  Return or call if symptoms worsen or do not improve

## 2018-09-07 NOTE — Pre-Procedure Instructions (Signed)
CBC, BMP, and Hgb A1C results 09/07/2018 sent to Dr. Alvan Dame via epic.

## 2018-09-07 NOTE — Progress Notes (Signed)
@Patient  ID: Adam [name], male    DOB: 01-Apr-1951, 67 y.o.   MRN: [id number]  Chief Complaint  Patient presents with  . Acute Visit    cough with green mucus, chest tightness and wheezing, nasal drainage    Referring provider: [name], MD  HPI: 67 year old male, former smoker quit in 1979. PMH significant for COPD with asthmatic features, chronic cough, allergic rhinitis, hypertension, CAD/CABG. Patient had a prolonged bought of bronchitis back in December 2018 that lasted a few weeks and required 3 rounds of antibiotics to clear. Patient of Dr. Lamonte Sakai, last seen 01/09/18.   09/07/18 Patient presents today for acute visit with complaints of cough with green mucus x2 days. Associated chest tightness and wheezing, which he states is his baseline. He is still using Breo and spiriva inhaler. Hasn't required rescue inhaler. Planning for hip surgery in 5 days on December 10th. He has pre-op appointment today. Afebrile today.    Allergies  Allergen Reactions  . Amoxicillin Rash and Other (See Comments)    * SEVERE RASH IN GROIN AREA Has patient had a PCN reaction causing immediate rash, facial/tongue/throat swelling, SOB or lightheadedness with hypotension: no Has patient had a PCN reaction causing severe rash involving mucus membranes or skin necrosis: no Has patient had a PCN reaction that required hospitalization: no Has patient had a PCN reaction occurring within the last 10 years: yes If all of the above answers are "NO", then may proceed with Cephalosporin use.   . Augmentin [Amoxicillin-Pot Clavulanate] Rash and Other (See Comments)    * SEVERE RASH IN GROIN AREA  . Azithromycin Rash and Other (See Comments)    * SEVERE RASH IN GROIN AREA  . Clindamycin/Lincomycin Rash  . Keflex [Cephalexin] Rash    Immunization History  Administered Date(s) Administered  . Influenza Split 07/04/2012  . Influenza, High Dose Seasonal PF 05/28/2016, 06/08/2017, 06/23/2018  .  Influenza,inj,Quad PF,6+ Mos 07/05/2013, 05/22/2014, 06/17/2015  . Pneumococcal Conjugate-13 12/01/2015  . Pneumococcal Polysaccharide-23 10/05/2006, 11/30/2016  . Td 10/06/2009  . Zoster 08/21/2013  . Zoster Recombinat (Shingrix) 12/02/2016, 04/02/2017    Past Medical History:  Diagnosis Date  . Allergy   . Anxiety    history of PTSD following CABG  . Ascending aortic aneurysm ([place]) 01/31/2018   43 x 42 mm, pt unaware  . Asthma   . Cardiomegaly 10/17/2017  . Colitis- colonoscopy 2014 07/13/2015  . COPD (chronic obstructive pulmonary disease) ([place])   . Coronary artery disease    x 6  . Depression   . Diabetes mellitus without complication ([place])   . Family history of polyps in the colon   . Finger dislocation    Left pinkie  . GERD (gastroesophageal reflux disease)   . Gout   . H/O atrial fibrillation without current medication    following CABG with no documented episodes since then.  . Heart palpitations   . Hx of adenomatous colonic polyps 08/12/2010  . Hyperlipidemia   . Hypertension   . OA (osteoarthritis)   . OSA (obstructive sleep apnea)    Mild, has not received CPAP yet  . Prediabetes   . RLS (restless legs syndrome)   . 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 Packs/day: 1.00  . Years: 7.00  . Pack years: 7.00  . Types: Cigarettes  . Last attempt to quit: 10/04/1977  .  Years since quitting: 40.9  Smokeless Tobacco Never Used  Tobacco Comment   never smoked over 1 pack    Counseling given: Not Answered Comment: never smoked over 1 pack    Outpatient Medications Prior to Visit  Medication Sig Dispense Refill  . allopurinol (ZYLOPRIM) 300 MG tablet TAKE 1 TABLET BY MOUTH EVERY DAY (Patient taking differently: Take 300 mg by mouth daily. ) 90 tablet 0  . BREO ELLIPTA 100-25 MCG/INH AEPB INHALE 1 PUFF INTO THE LUNGS DAILY (Patient taking differently: Inhale 1 puff  into the lungs daily. ) 60 each 5  . Coenzyme Q10 (CO Q-10) 100 MG CAPS Take 100 mg by mouth daily.     . diphenhydrAMINE HCl, Sleep, (ZZZQUIL) 25 MG CAPS Take 25 mg by mouth at bedtime as needed (for sleep).    . dofetilide (TIKOSYN) 125 MCG capsule Take 1 capsule (125 mcg total) by mouth every 12 (twelve) hours. 60 capsule 2  . econazole nitrate 1 % cream Apply 1 application topically every other day.     . fenofibrate 160 MG tablet TAKE 1 TABLET(160 MG) BY MOUTH DAILY (Patient taking differently: Take 160 mg by mouth daily. ) 90 tablet 0  . fluticasone (FLONASE) 50 MCG/ACT nasal spray Place 1 spray into both nostrils daily.     Marland Kitchen glucose blood (ONETOUCH VERIO) test strip Test once daily.  Dx E11.9 100 each 3  . hydrochlorothiazide (HYDRODIURIL) 25 MG tablet Take 1 tablet (25 mg total) by mouth daily. 90 tablet 3  . Insulin Glargine (TOUJEO SOLOSTAR) 300 UNIT/ML SOPN Inject 60 Units as directed daily. (Patient taking differently: Inject 60 Units into the skin daily. ) 22.5 pen 0  . JARDIANCE 25 MG TABS tablet TAKE 1 TABLET BY MOUTH DAILY (Patient taking differently: Take 25 mg by mouth daily. ) 90 tablet 0  . LORazepam (ATIVAN) 0.5 MG tablet Take 1 tablet (0.5 mg total) by mouth every 8 (eight) hours as needed for anxiety. 90 tablet 5  . losartan (COZAAR) 100 MG tablet Take 1 tablet (100 mg total) by mouth daily. 90 tablet 3  . metFORMIN (GLUCOPHAGE) 1000 MG tablet TAKE 1 TABLET BY MOUTH TWICE DAILY WITH A MEAL (Patient taking differently: Take 1,000 mg by mouth 2 (two) times daily with a meal. ) 180 tablet 0  . metoprolol (LOPRESSOR) 100 MG tablet Take 100 mg by mouth 2 (two) times daily.    . montelukast (SINGULAIR) 10 MG tablet TAKE 1 TABLET BY MOUTH AT BEDTIME (Patient taking differently: Take 10 mg by mouth at bedtime. ) 90 tablet 0  . Olopatadine HCl (PATADAY) 0.2 % SOLN Apply 1 drop to eye as needed. (Patient taking differently: Place 1 drop into both eyes daily as needed (for allergies). )  2.5 mL 5  . pantoprazole (PROTONIX) 40 MG tablet TAKE 1 TABLET BY MOUTH TWICE DAILY (Patient taking differently: Take 40 mg by mouth 2 (two) times daily. ) 180 tablet 0  . potassium chloride SA (K-DUR,KLOR-CON) 20 MEQ tablet Take 2 tablets (40 mEq total) by mouth daily. 60 tablet 12  . pramipexole (MIRAPEX) 0.25 MG tablet TAKE 1 TABLET(0.25 MG) BY MOUTH AT BEDTIME (Patient taking differently: Take 0.25 mg by mouth at bedtime. ) 90 tablet 1  . ranitidine (ZANTAC) 150 MG capsule Take 150 mg by mouth every evening.    . simvastatin (ZOCOR) 80 MG tablet TAKE 1 TABLET BY MOUTH EVERY DAY (Patient taking differently: Take 80 mg by mouth daily. ) 90 tablet 0  .  SPIRIVA HANDIHALER 18 MCG inhalation capsule INHALE CONTENTS OF 1 CAPSULE ONCE DAILY USING HANDIHALER (Patient taking differently: Place 18 mcg into inhaler and inhale daily. ) 90 capsule 1  . XARELTO 20 MG TABS tablet TAKE 1 TABLET BY MOUTH DAILY WITH SUPPER (Patient taking differently: Take 20 mg by mouth daily with supper. ) 30 tablet 3   No facility-administered medications prior to visit.     Review of Systems  Review of Systems  Constitutional: Negative.   HENT: Positive for congestion.   Respiratory: Positive for cough. Negative for shortness of breath and wheezing.   Cardiovascular: Negative.     Physical Exam  BP 116/60 (BP Location: Left Arm, Patient Position: Sitting)   Pulse 94   Temp 97.6 F (36.4 C)   Ht 5' 10.5" (1.791 m)   Wt 261 lb 12.8 oz (118.8 kg)   SpO2 97%   BMI 37.03 kg/m  Physical Exam  Constitutional: He is oriented to person, place, and time. He appears well-developed and well-nourished.  Appears well   HENT:  Head: Normocephalic and atraumatic.  Eyes: Pupils are equal, round, and reactive to light. EOM are normal.  Neck: Normal range of motion. Neck supple.  Cardiovascular: Normal rate, regular rhythm, normal heart sounds and intact distal pulses.  Pulmonary/Chest: Effort normal and breath sounds  normal. No respiratory distress. He has no wheezes.  CTA t/o  Abdominal: Soft. Bowel sounds are normal. There is no tenderness.  Neurological: He is alert and oriented to person, place, and time.  Skin: Skin is warm and dry. No rash noted. No erythema.  Psychiatric: He has a normal mood and affect. His behavior is normal. Judgment normal.      116/60   Lab Results:  CBC    Component Value Date/Time   WBC 6.9 03/28/2018 1136   WBC 8.3 12/12/2017 1642   RBC 4.72 03/28/2018 1136   RBC 4.50 12/12/2017 1642   HGB 16.3 03/28/2018 1136   HCT 45.4 03/28/2018 1136   PLT 209 03/28/2018 1136   MCV 96 03/28/2018 1136   MCH 34.5 (H) 03/28/2018 1136   MCH 34.2 (H) 12/12/2017 1642   MCHC 35.9 (H) 03/28/2018 1136   MCHC 34.6 12/12/2017 1642   RDW 14.2 03/28/2018 1136   LYMPHSABS 1.6 03/28/2018 1136   MONOABS 1.4 (H) 12/12/2017 1642   EOSABS 0.1 03/28/2018 1136   BASOSABS 0.0 03/28/2018 1136    BMET    Component Value Date/Time   NA 137 05/01/2018 0945   NA 138 03/28/2018 1136   K 4.2 05/01/2018 0945   CL 97 (L) 05/01/2018 0945   CO2 24 05/01/2018 0945   GLUCOSE 148 (H) 05/01/2018 0945   BUN 11 05/01/2018 0945   BUN 16 03/28/2018 1136   CREATININE 0.89 05/01/2018 0945   CALCIUM 10.1 05/01/2018 0945   GFRNONAA >60 05/01/2018 0945   GFRAA >60 05/01/2018 0945    BNP No results found for: BNP  ProBNP    Component Value Date/Time   PROBNP 241.0 (H) 10/17/2017 1726    Imaging: No results found.   Assessment & Plan:   Acute bronchitis with COPD ([place]) Complains of cough x 2 days  COPD does not appear exacerbated  Will check CXR today Instructed to take mucinex twice daily and delsym cough syrup RX for doxycyline sent to [place]  Return or call if symptoms worsen or do not improve    Martyn Ehrich, NP 09/07/2018

## 2018-09-07 NOTE — Telephone Encounter (Signed)
Pt called this morning and was scheduled with Beth at 10am today.

## 2018-09-07 NOTE — Patient Instructions (Addendum)
Mucinex 1 tab twice a day with water   Delsym cough syrup 5-94ml every 12 hours (max 75ml in 24hrs)  Doxycyline 1 tab twice daily x 1 week   CXR today   Return or call if symptoms worsen or do not improve     Acute Bronchitis, Adult Acute bronchitis is when air tubes (bronchi) in the lungs suddenly get swollen. The condition can make it hard to breathe. It can also cause these symptoms:  A cough.  Coughing up clear, yellow, or green mucus.  Wheezing.  Chest congestion.  Shortness of breath.  A fever.  Body aches.  Chills.  A sore throat.  Follow these instructions at home: Medicines  Take over-the-counter and prescription medicines only as told by your doctor.  If you were prescribed an antibiotic medicine, take it as told by your doctor. Do not stop taking the antibiotic even if you start to feel better. General instructions  Rest.  Drink enough fluids to keep your pee (urine) clear or pale yellow.  Avoid smoking and secondhand smoke. If you smoke and you need help quitting, ask your doctor. Quitting will help your lungs heal faster.  Use an inhaler, cool mist vaporizer, or humidifier as told by your doctor.  Keep all follow-up visits as told by your doctor. This is important. How is this prevented? To lower your risk of getting this condition again:  Wash your hands often with soap and water. If you cannot use soap and water, use hand sanitizer.  Avoid contact with people who have cold symptoms.  Try not to touch your hands to your mouth, nose, or eyes.  Make sure to get the flu shot every year.  Contact a doctor if:  Your symptoms do not get better in 2 weeks. Get help right away if:  You cough up blood.  You have chest pain.  You have very bad shortness of breath.  You become dehydrated.  You faint (pass out) or keep feeling like you are going to pass out.  You keep throwing up (vomiting).  You have a very bad headache.  Your fever  or chills gets worse. This information is not intended to replace advice given to you by your health care provider. Make sure you discuss any questions you have with your health care provider. Document Released: 03/08/2008 Document Revised: 04/28/2016 Document Reviewed: 03/10/2016 Elsevier Interactive Patient Education  [employer].

## 2018-09-11 ENCOUNTER — Other Ambulatory Visit: Payer: Self-pay | Admitting: Family Medicine

## 2018-09-11 ENCOUNTER — Other Ambulatory Visit (HOSPITAL_COMMUNITY): Payer: Self-pay | Admitting: Nurse Practitioner

## 2018-09-11 MED ORDER — VANCOMYCIN HCL 10 G IV SOLR
1500.0000 mg | INTRAVENOUS | Status: DC
  Filled 2018-09-11: qty 1500

## 2018-09-11 MED ORDER — GENTAMICIN SULFATE 40 MG/ML IJ SOLN
5.0000 mg/kg | INTRAVENOUS | Status: DC
  Filled 2018-09-11 (×2): qty 11.5

## 2018-09-12 ENCOUNTER — Inpatient Hospital Stay (HOSPITAL_COMMUNITY): Payer: Medicare Other

## 2018-09-12 ENCOUNTER — Encounter (HOSPITAL_COMMUNITY): Payer: Self-pay | Admitting: *Deleted

## 2018-09-12 ENCOUNTER — Encounter (HOSPITAL_COMMUNITY)
Admission: RE | Disposition: A | Discharge status: Home / Self Care | Payer: Self-pay | Source: Home / Self Care | Attending: Orthopedic Surgery

## 2018-09-12 ENCOUNTER — Other Ambulatory Visit: Payer: Self-pay

## 2018-09-12 ENCOUNTER — Inpatient Hospital Stay (HOSPITAL_COMMUNITY): Payer: Medicare Other | Admitting: Anesthesiology

## 2018-09-12 ENCOUNTER — Inpatient Hospital Stay (HOSPITAL_COMMUNITY)
Admission: RE | Admit: 2018-09-12 | Discharge: 2018-09-13 | DRG: 470 | Disposition: A | Discharge status: Home / Self Care | Payer: Medicare Other | Attending: Orthopedic Surgery | Admitting: Orthopedic Surgery

## 2018-09-12 DIAGNOSIS — M25751 Osteophyte, right hip: Secondary | ICD-10-CM | POA: Diagnosis present

## 2018-09-12 DIAGNOSIS — Z888 Allergy status to other drugs, medicaments and biological substances status: Secondary | ICD-10-CM | POA: Diagnosis not present

## 2018-09-12 DIAGNOSIS — M1611 Unilateral primary osteoarthritis, right hip: Principal | ICD-10-CM | POA: Diagnosis present

## 2018-09-12 DIAGNOSIS — J449 Chronic obstructive pulmonary disease, unspecified: Secondary | ICD-10-CM | POA: Diagnosis not present

## 2018-09-12 DIAGNOSIS — Z7951 Long term (current) use of inhaled steroids: Secondary | ICD-10-CM

## 2018-09-12 DIAGNOSIS — Z881 Allergy status to other antibiotic agents status: Secondary | ICD-10-CM | POA: Diagnosis not present

## 2018-09-12 DIAGNOSIS — I10 Essential (primary) hypertension: Secondary | ICD-10-CM | POA: Diagnosis present

## 2018-09-12 DIAGNOSIS — Z6837 Body mass index (BMI) 37.0-37.9, adult: Secondary | ICD-10-CM | POA: Diagnosis not present

## 2018-09-12 DIAGNOSIS — F4310 Post-traumatic stress disorder, unspecified: Secondary | ICD-10-CM | POA: Diagnosis present

## 2018-09-12 DIAGNOSIS — Z87891 Personal history of nicotine dependence: Secondary | ICD-10-CM | POA: Diagnosis not present

## 2018-09-12 DIAGNOSIS — J4530 Mild persistent asthma, uncomplicated: Secondary | ICD-10-CM | POA: Diagnosis present

## 2018-09-12 DIAGNOSIS — K219 Gastro-esophageal reflux disease without esophagitis: Secondary | ICD-10-CM | POA: Diagnosis present

## 2018-09-12 DIAGNOSIS — Z8249 Family history of ischemic heart disease and other diseases of the circulatory system: Secondary | ICD-10-CM

## 2018-09-12 DIAGNOSIS — Z96643 Presence of artificial hip joint, bilateral: Secondary | ICD-10-CM | POA: Diagnosis not present

## 2018-09-12 DIAGNOSIS — I2510 Atherosclerotic heart disease of native coronary artery without angina pectoris: Secondary | ICD-10-CM | POA: Diagnosis present

## 2018-09-12 DIAGNOSIS — Z96649 Presence of unspecified artificial hip joint: Secondary | ICD-10-CM

## 2018-09-12 DIAGNOSIS — Z794 Long term (current) use of insulin: Secondary | ICD-10-CM | POA: Diagnosis not present

## 2018-09-12 DIAGNOSIS — I4819 Other persistent atrial fibrillation: Secondary | ICD-10-CM | POA: Diagnosis not present

## 2018-09-12 DIAGNOSIS — Z419 Encounter for procedure for purposes other than remedying health state, unspecified: Secondary | ICD-10-CM

## 2018-09-12 DIAGNOSIS — Z96641 Presence of right artificial hip joint: Secondary | ICD-10-CM | POA: Diagnosis not present

## 2018-09-12 DIAGNOSIS — E785 Hyperlipidemia, unspecified: Secondary | ICD-10-CM | POA: Diagnosis not present

## 2018-09-12 DIAGNOSIS — Z800 Family history of malignant neoplasm of digestive organs: Secondary | ICD-10-CM | POA: Diagnosis not present

## 2018-09-12 DIAGNOSIS — E119 Type 2 diabetes mellitus without complications: Secondary | ICD-10-CM | POA: Diagnosis not present

## 2018-09-12 DIAGNOSIS — E669 Obesity, unspecified: Secondary | ICD-10-CM | POA: Diagnosis present

## 2018-09-12 DIAGNOSIS — Z471 Aftercare following joint replacement surgery: Secondary | ICD-10-CM | POA: Diagnosis not present

## 2018-09-12 HISTORY — PX: TOTAL HIP ARTHROPLASTY: SHX124

## 2018-09-12 LAB — TYPE AND SCREEN
ABO/RH(D): O POS
Antibody Screen: NEGATIVE

## 2018-09-12 LAB — GLUCOSE, CAPILLARY
GLUCOSE-CAPILLARY: 172 mg/dL — AB (ref 70–99)
Glucose-Capillary: 226 mg/dL — ABNORMAL HIGH (ref 70–99)

## 2018-09-12 SURGERY — ARTHROPLASTY, HIP, TOTAL, ANTERIOR APPROACH
Anesthesia: General | Laterality: Right

## 2018-09-12 MED ORDER — METHOCARBAMOL 500 MG IVPB - SIMPLE MED
INTRAVENOUS | Status: AC
  Filled 2018-09-12: qty 50

## 2018-09-12 MED ORDER — METHOCARBAMOL 500 MG IVPB - SIMPLE MED
500.0000 mg | Freq: Four times a day (QID) | INTRAVENOUS | Status: DC | PRN
  Administered 2018-09-12: 500 mg via INTRAVENOUS
  Filled 2018-09-12: qty 50

## 2018-09-12 MED ORDER — LORAZEPAM 0.5 MG PO TABS
0.5000 mg | ORAL_TABLET | Freq: Three times a day (TID) | ORAL | Status: DC | PRN
  Administered 2018-09-12: 0.5 mg via ORAL
  Filled 2018-09-12: qty 1

## 2018-09-12 MED ORDER — SODIUM CHLORIDE 0.9 % IR SOLN
Status: DC | PRN
  Administered 2018-09-12: 1000 mL

## 2018-09-12 MED ORDER — ACETAMINOPHEN 325 MG PO TABS: 325.0000 mg | ORAL_TABLET | Freq: Four times a day (QID) | ORAL | Status: DC | PRN

## 2018-09-12 MED ORDER — INSULIN ASPART 100 UNIT/ML ~~LOC~~ SOLN
0.0000 [IU] | Freq: Three times a day (TID) | SUBCUTANEOUS | Status: DC
  Administered 2018-09-13: 5 [IU] via SUBCUTANEOUS
  Administered 2018-09-13: 3 [IU] via SUBCUTANEOUS

## 2018-09-12 MED ORDER — DIPHENHYDRAMINE HCL 25 MG PO CAPS: 25.0000 mg | ORAL_CAPSULE | Freq: Every evening | ORAL | Status: DC | PRN

## 2018-09-12 MED ORDER — HYDROCHLOROTHIAZIDE 25 MG PO TABS
25.0000 mg | ORAL_TABLET | Freq: Every day | ORAL | Status: DC
  Administered 2018-09-12 – 2018-09-13 (×2): 25 mg via ORAL
  Filled 2018-09-12 (×2): qty 1

## 2018-09-12 MED ORDER — TRANEXAMIC ACID-NACL 1000-0.7 MG/100ML-% IV SOLN
1000.0000 mg | INTRAVENOUS | Status: AC
  Administered 2018-09-12: 1000 mg via INTRAVENOUS
  Filled 2018-09-12: qty 100

## 2018-09-12 MED ORDER — MIDAZOLAM HCL 2 MG/2ML IJ SOLN
INTRAMUSCULAR | Status: AC
  Filled 2018-09-12: qty 2

## 2018-09-12 MED ORDER — POLYETHYLENE GLYCOL 3350 17 G PO PACK
17.0000 g | PACK | Freq: Two times a day (BID) | ORAL | Status: DC
  Administered 2018-09-13: 17 g via ORAL
  Filled 2018-09-12 (×2): qty 1

## 2018-09-12 MED ORDER — HYDROCODONE-ACETAMINOPHEN 7.5-325 MG PO TABS: 1.0000 | ORAL_TABLET | ORAL | 0 refills | Status: DC | PRN

## 2018-09-12 MED ORDER — ONDANSETRON HCL 4 MG/2ML IJ SOLN
INTRAMUSCULAR | Status: DC | PRN
  Administered 2018-09-12: 4 mg via INTRAVENOUS

## 2018-09-12 MED ORDER — LIDOCAINE 2% (20 MG/ML) 5 ML SYRINGE
INTRAMUSCULAR | Status: AC
  Filled 2018-09-12: qty 5

## 2018-09-12 MED ORDER — METOPROLOL TARTRATE 50 MG PO TABS
100.0000 mg | ORAL_TABLET | Freq: Two times a day (BID) | ORAL | Status: DC
  Administered 2018-09-12 – 2018-09-13 (×2): 100 mg via ORAL
  Filled 2018-09-12 (×2): qty 2

## 2018-09-12 MED ORDER — ATORVASTATIN CALCIUM 40 MG PO TABS: 40.0000 mg | ORAL_TABLET | Freq: Every day | ORAL | Status: DC

## 2018-09-12 MED ORDER — DEXAMETHASONE SODIUM PHOSPHATE 10 MG/ML IJ SOLN
INTRAMUSCULAR | Status: AC
  Filled 2018-09-12: qty 1

## 2018-09-12 MED ORDER — LACTATED RINGERS IV SOLN
INTRAVENOUS | Status: DC
  Administered 2018-09-12 (×3): via INTRAVENOUS

## 2018-09-12 MED ORDER — TRANEXAMIC ACID-NACL 1000-0.7 MG/100ML-% IV SOLN: 1000.0000 mg | Freq: Once | INTRAVENOUS | Status: DC

## 2018-09-12 MED ORDER — STERILE WATER FOR IRRIGATION IR SOLN
Status: DC | PRN
  Administered 2018-09-12: 2000 mL

## 2018-09-12 MED ORDER — RIVAROXABAN 10 MG PO TABS
10.0000 mg | ORAL_TABLET | ORAL | Status: DC
  Administered 2018-09-13: 10 mg via ORAL
  Filled 2018-09-12: qty 1

## 2018-09-12 MED ORDER — PRAMIPEXOLE DIHYDROCHLORIDE 0.25 MG PO TABS
0.2500 mg | ORAL_TABLET | Freq: Every day | ORAL | Status: DC
  Administered 2018-09-12: 0.25 mg via ORAL
  Filled 2018-09-12: qty 1

## 2018-09-12 MED ORDER — CHLORHEXIDINE GLUCONATE 4 % EX LIQD: 60.0000 mL | Freq: Once | CUTANEOUS | Status: DC

## 2018-09-12 MED ORDER — MONTELUKAST SODIUM 10 MG PO TABS
10.0000 mg | ORAL_TABLET | Freq: Every day | ORAL | Status: DC
  Administered 2018-09-12: 10 mg via ORAL
  Filled 2018-09-12: qty 1

## 2018-09-12 MED ORDER — CEFAZOLIN SODIUM-DEXTROSE 2-4 GM/100ML-% IV SOLN
INTRAVENOUS | Status: AC
  Filled 2018-09-12: qty 100

## 2018-09-12 MED ORDER — DIPHENHYDRAMINE HCL 12.5 MG/5ML PO ELIX: 12.5000 mg | ORAL_SOLUTION | ORAL | Status: DC | PRN

## 2018-09-12 MED ORDER — METHOCARBAMOL 500 MG PO TABS: 500.0000 mg | ORAL_TABLET | Freq: Four times a day (QID) | ORAL | 0 refills | Status: DC | PRN

## 2018-09-12 MED ORDER — DOXYCYCLINE HYCLATE 100 MG PO TABS
100.0000 mg | ORAL_TABLET | Freq: Two times a day (BID) | ORAL | Status: DC
  Administered 2018-09-12 – 2018-09-13 (×2): 100 mg via ORAL
  Filled 2018-09-12 (×2): qty 1

## 2018-09-12 MED ORDER — FLUTICASONE FUROATE-VILANTEROL 100-25 MCG/INH IN AEPB
1.0000 | INHALATION_SPRAY | Freq: Every day | RESPIRATORY_TRACT | Status: DC
  Administered 2018-09-13: 1 via RESPIRATORY_TRACT
  Filled 2018-09-12: qty 28

## 2018-09-12 MED ORDER — POLYETHYLENE GLYCOL 3350 17 G PO PACK: 17.0000 g | PACK | Freq: Two times a day (BID) | ORAL | 0 refills | Status: DC

## 2018-09-12 MED ORDER — SUCCINYLCHOLINE CHLORIDE 200 MG/10ML IV SOSY
PREFILLED_SYRINGE | INTRAVENOUS | Status: DC | PRN
  Administered 2018-09-12: 10 mg via INTRAVENOUS

## 2018-09-12 MED ORDER — HYDROMORPHONE HCL 1 MG/ML IJ SOLN
INTRAMUSCULAR | Status: AC
  Filled 2018-09-12: qty 2

## 2018-09-12 MED ORDER — MIDAZOLAM HCL 5 MG/5ML IJ SOLN
INTRAMUSCULAR | Status: DC | PRN
  Administered 2018-09-12: 2 mg via INTRAVENOUS

## 2018-09-12 MED ORDER — HYDROCODONE-ACETAMINOPHEN 5-325 MG PO TABS
1.0000 | ORAL_TABLET | ORAL | Status: DC | PRN
  Administered 2018-09-12 – 2018-09-13 (×4): 2 via ORAL
  Filled 2018-09-12 (×4): qty 2

## 2018-09-12 MED ORDER — ALUM & MAG HYDROXIDE-SIMETH 200-200-20 MG/5ML PO SUSP: 15.0000 mL | ORAL | Status: DC | PRN

## 2018-09-12 MED ORDER — DOFETILIDE 125 MCG PO CAPS
125.0000 ug | ORAL_CAPSULE | Freq: Two times a day (BID) | ORAL | Status: DC
  Administered 2018-09-12 – 2018-09-13 (×2): 125 ug via ORAL
  Filled 2018-09-12 (×2): qty 1

## 2018-09-12 MED ORDER — FENTANYL CITRATE (PF) 100 MCG/2ML IJ SOLN
INTRAMUSCULAR | Status: AC
  Filled 2018-09-12: qty 2

## 2018-09-12 MED ORDER — FERROUS SULFATE 325 (65 FE) MG PO TABS
325.0000 mg | ORAL_TABLET | Freq: Three times a day (TID) | ORAL | Status: DC
  Administered 2018-09-13 (×2): 325 mg via ORAL
  Filled 2018-09-12 (×2): qty 1

## 2018-09-12 MED ORDER — ONDANSETRON HCL 4 MG PO TABS: 4.0000 mg | ORAL_TABLET | Freq: Four times a day (QID) | ORAL | Status: DC | PRN

## 2018-09-12 MED ORDER — METFORMIN HCL 500 MG PO TABS
1000.0000 mg | ORAL_TABLET | Freq: Two times a day (BID) | ORAL | Status: DC
  Administered 2018-09-13: 1000 mg via ORAL
  Filled 2018-09-12: qty 2

## 2018-09-12 MED ORDER — METOCLOPRAMIDE HCL 5 MG/ML IJ SOLN: 5.0000 mg | Freq: Three times a day (TID) | INTRAMUSCULAR | Status: DC | PRN

## 2018-09-12 MED ORDER — HYDROMORPHONE HCL 1 MG/ML IJ SOLN
0.2500 mg | INTRAMUSCULAR | Status: DC | PRN
  Administered 2018-09-12 (×3): 0.5 mg via INTRAVENOUS

## 2018-09-12 MED ORDER — INSULIN GLARGINE 100 UNIT/ML ~~LOC~~ SOLN
60.0000 [IU] | Freq: Every day | SUBCUTANEOUS | Status: DC
  Administered 2018-09-13: 60 [IU] via SUBCUTANEOUS
  Filled 2018-09-12: qty 0.6

## 2018-09-12 MED ORDER — SUGAMMADEX SODIUM 200 MG/2ML IV SOLN
INTRAVENOUS | Status: AC
  Filled 2018-09-12: qty 2

## 2018-09-12 MED ORDER — ECONAZOLE NITRATE 1 % EX CREA: 1.0000 "application " | TOPICAL_CREAM | CUTANEOUS | Status: DC

## 2018-09-12 MED ORDER — METHOCARBAMOL 500 MG PO TABS
500.0000 mg | ORAL_TABLET | Freq: Four times a day (QID) | ORAL | Status: DC | PRN
  Administered 2018-09-12 – 2018-09-13 (×2): 500 mg via ORAL
  Filled 2018-09-12 (×2): qty 1

## 2018-09-12 MED ORDER — MAGNESIUM CITRATE PO SOLN: 1.0000 | Freq: Once | ORAL | Status: DC | PRN

## 2018-09-12 MED ORDER — DOCUSATE SODIUM 100 MG PO CAPS
100.0000 mg | ORAL_CAPSULE | Freq: Two times a day (BID) | ORAL | Status: DC
  Administered 2018-09-12 – 2018-09-13 (×2): 100 mg via ORAL
  Filled 2018-09-12 (×2): qty 1

## 2018-09-12 MED ORDER — SODIUM CHLORIDE 0.9 % IV SOLN
INTRAVENOUS | Status: DC
  Administered 2018-09-12 – 2018-09-13 (×2): via INTRAVENOUS

## 2018-09-12 MED ORDER — CANAGLIFLOZIN 100 MG PO TABS
100.0000 mg | ORAL_TABLET | Freq: Every day | ORAL | Status: DC
  Administered 2018-09-13: 100 mg via ORAL
  Filled 2018-09-12: qty 1

## 2018-09-12 MED ORDER — FLUTICASONE PROPIONATE 50 MCG/ACT NA SUSP
1.0000 | Freq: Every day | NASAL | Status: DC
  Filled 2018-09-12: qty 16

## 2018-09-12 MED ORDER — DIPHENHYDRAMINE HCL (SLEEP) 25 MG PO CAPS: 25.0000 mg | ORAL_CAPSULE | Freq: Every evening | ORAL | Status: DC | PRN

## 2018-09-12 MED ORDER — FENTANYL CITRATE (PF) 250 MCG/5ML IJ SOLN
INTRAMUSCULAR | Status: AC
  Filled 2018-09-12: qty 5

## 2018-09-12 MED ORDER — FENTANYL CITRATE (PF) 250 MCG/5ML IJ SOLN
INTRAMUSCULAR | Status: DC | PRN
  Administered 2018-09-12 (×4): 50 ug via INTRAVENOUS
  Administered 2018-09-12: 100 ug via INTRAVENOUS
  Administered 2018-09-12: 50 ug via INTRAVENOUS

## 2018-09-12 MED ORDER — POTASSIUM CHLORIDE CRYS ER 20 MEQ PO TBCR
40.0000 meq | EXTENDED_RELEASE_TABLET | Freq: Every day | ORAL | Status: DC
  Administered 2018-09-12 – 2018-09-13 (×2): 40 meq via ORAL
  Filled 2018-09-12 (×2): qty 2

## 2018-09-12 MED ORDER — DEXAMETHASONE SODIUM PHOSPHATE 10 MG/ML IJ SOLN
10.0000 mg | Freq: Once | INTRAMUSCULAR | Status: AC
  Administered 2018-09-13: 10 mg via INTRAVENOUS
  Filled 2018-09-12: qty 1

## 2018-09-12 MED ORDER — ALLOPURINOL 300 MG PO TABS
300.0000 mg | ORAL_TABLET | Freq: Every day | ORAL | Status: DC
  Administered 2018-09-13: 300 mg via ORAL
  Filled 2018-09-12: qty 1

## 2018-09-12 MED ORDER — PROPOFOL 10 MG/ML IV BOLUS
INTRAVENOUS | Status: DC | PRN
  Administered 2018-09-12: 180 mg via INTRAVENOUS

## 2018-09-12 MED ORDER — ONDANSETRON HCL 4 MG/2ML IJ SOLN
INTRAMUSCULAR | Status: AC
  Filled 2018-09-12: qty 2

## 2018-09-12 MED ORDER — BISACODYL 10 MG RE SUPP: 10.0000 mg | Freq: Every day | RECTAL | Status: DC | PRN

## 2018-09-12 MED ORDER — LIDOCAINE 2% (20 MG/ML) 5 ML SYRINGE
INTRAMUSCULAR | Status: DC | PRN
  Administered 2018-09-12: 60 mg via INTRAVENOUS

## 2018-09-12 MED ORDER — DEXAMETHASONE SODIUM PHOSPHATE 10 MG/ML IJ SOLN
10.0000 mg | Freq: Once | INTRAMUSCULAR | Status: AC
  Administered 2018-09-12: 10 mg via INTRAVENOUS

## 2018-09-12 MED ORDER — MENTHOL 3 MG MT LOZG: 1.0000 | LOZENGE | OROMUCOSAL | Status: DC | PRN

## 2018-09-12 MED ORDER — PANTOPRAZOLE SODIUM 40 MG PO TBEC
40.0000 mg | DELAYED_RELEASE_TABLET | Freq: Two times a day (BID) | ORAL | Status: DC
  Administered 2018-09-12 – 2018-09-13 (×2): 40 mg via ORAL
  Filled 2018-09-12 (×2): qty 1

## 2018-09-12 MED ORDER — LOSARTAN POTASSIUM 50 MG PO TABS
100.0000 mg | ORAL_TABLET | Freq: Every day | ORAL | Status: DC
  Administered 2018-09-12 – 2018-09-13 (×2): 100 mg via ORAL
  Filled 2018-09-12 (×2): qty 2

## 2018-09-12 MED ORDER — METOCLOPRAMIDE HCL 5 MG PO TABS: 5.0000 mg | ORAL_TABLET | Freq: Three times a day (TID) | ORAL | Status: DC | PRN

## 2018-09-12 MED ORDER — HYDROCODONE-ACETAMINOPHEN 7.5-325 MG PO TABS: 1.0000 | ORAL_TABLET | ORAL | Status: DC | PRN

## 2018-09-12 MED ORDER — ROCURONIUM BROMIDE 10 MG/ML (PF) SYRINGE
PREFILLED_SYRINGE | INTRAVENOUS | Status: DC | PRN
  Administered 2018-09-12: 50 mg via INTRAVENOUS
  Administered 2018-09-12: 30 mg via INTRAVENOUS

## 2018-09-12 MED ORDER — UMECLIDINIUM BROMIDE 62.5 MCG/INH IN AEPB
1.0000 | INHALATION_SPRAY | Freq: Every day | RESPIRATORY_TRACT | Status: DC
  Administered 2018-09-13: 1 via RESPIRATORY_TRACT
  Filled 2018-09-12: qty 7

## 2018-09-12 MED ORDER — FERROUS SULFATE 325 (65 FE) MG PO TABS: 325.0000 mg | ORAL_TABLET | Freq: Three times a day (TID) | ORAL | 3 refills | Status: DC

## 2018-09-12 MED ORDER — HYDROMORPHONE HCL 1 MG/ML IJ SOLN
0.5000 mg | INTRAMUSCULAR | Status: DC | PRN
  Administered 2018-09-12: 1 mg via INTRAVENOUS
  Filled 2018-09-12: qty 1

## 2018-09-12 MED ORDER — FENOFIBRATE 160 MG PO TABS
160.0000 mg | ORAL_TABLET | Freq: Every day | ORAL | Status: DC
  Administered 2018-09-12 – 2018-09-13 (×2): 160 mg via ORAL
  Filled 2018-09-12 (×2): qty 1

## 2018-09-12 MED ORDER — FAMOTIDINE 20 MG PO TABS
20.0000 mg | ORAL_TABLET | Freq: Every day | ORAL | Status: DC
  Administered 2018-09-12: 20 mg via ORAL
  Filled 2018-09-12: qty 1

## 2018-09-12 MED ORDER — ONDANSETRON HCL 4 MG/2ML IJ SOLN: 4.0000 mg | Freq: Four times a day (QID) | INTRAMUSCULAR | Status: DC | PRN

## 2018-09-12 MED ORDER — PHENOL 1.4 % MT LIQD: 1.0000 | OROMUCOSAL | Status: DC | PRN

## 2018-09-12 MED ORDER — FENTANYL CITRATE (PF) 100 MCG/2ML IJ SOLN
25.0000 ug | INTRAMUSCULAR | Status: DC | PRN
  Administered 2018-09-12 (×2): 50 ug via INTRAVENOUS

## 2018-09-12 MED ORDER — CEFAZOLIN SODIUM-DEXTROSE 2-4 GM/100ML-% IV SOLN
2.0000 g | Freq: Once | INTRAVENOUS | Status: AC
  Administered 2018-09-12: 2 g via INTRAVENOUS

## 2018-09-12 MED ORDER — SUGAMMADEX SODIUM 200 MG/2ML IV SOLN
INTRAVENOUS | Status: DC | PRN
  Administered 2018-09-12: 200 mg via INTRAVENOUS

## 2018-09-12 MED ORDER — DOCUSATE SODIUM 100 MG PO CAPS: 100.0000 mg | ORAL_CAPSULE | Freq: Two times a day (BID) | ORAL | 0 refills | Status: DC

## 2018-09-12 MED ORDER — PHENYLEPHRINE 40 MCG/ML (10ML) SYRINGE FOR IV PUSH (FOR BLOOD PRESSURE SUPPORT)
PREFILLED_SYRINGE | INTRAVENOUS | Status: DC | PRN
  Administered 2018-09-12: 80 ug via INTRAVENOUS

## 2018-09-12 MED ORDER — TIOTROPIUM BROMIDE MONOHYDRATE 18 MCG IN CAPS: 18.0000 ug | ORAL_CAPSULE | Freq: Every day | RESPIRATORY_TRACT | Status: DC

## 2018-09-12 MED ORDER — CELECOXIB 200 MG PO CAPS
200.0000 mg | ORAL_CAPSULE | Freq: Two times a day (BID) | ORAL | Status: DC
  Administered 2018-09-12 – 2018-09-13 (×2): 200 mg via ORAL
  Filled 2018-09-12 (×2): qty 1

## 2018-09-12 MED ORDER — CEFAZOLIN SODIUM-DEXTROSE 2-4 GM/100ML-% IV SOLN
2.0000 g | Freq: Four times a day (QID) | INTRAVENOUS | Status: AC
  Administered 2018-09-12 – 2018-09-13 (×2): 2 g via INTRAVENOUS
  Filled 2018-09-12 (×2): qty 100

## 2018-09-12 SURGICAL SUPPLY — 41 items
BAG DECANTER FOR FLEXI CONT (MISCELLANEOUS) IMPLANT
BAG ZIPLOCK 12X15 (MISCELLANEOUS) ×2 IMPLANT
BLADE SAG 18X100X1.27 (BLADE) ×2 IMPLANT
BLADE SURG SZ10 CARB STEEL (BLADE) ×4 IMPLANT
COVER PERINEAL POST (MISCELLANEOUS) IMPLANT
COVER SURGICAL LIGHT HANDLE (MISCELLANEOUS) ×2 IMPLANT
COVER WAND RF STERILE (DRAPES) ×2 IMPLANT
CUP ACETBLR 54 OD PINNACLE (Hips) ×2 IMPLANT
DERMABOND ADVANCED (GAUZE/BANDAGES/DRESSINGS) ×1
DERMABOND ADVANCED .7 DNX12 (GAUZE/BANDAGES/DRESSINGS) ×1 IMPLANT
DRAPE STERI IOBAN 125X83 (DRAPES) ×2 IMPLANT
DRAPE U-SHAPE 47X51 STRL (DRAPES) ×4 IMPLANT
DRESSING AQUACEL AG SP 3.5X10 (GAUZE/BANDAGES/DRESSINGS) ×1 IMPLANT
DRSG AQUACEL AG SP 3.5X10 (GAUZE/BANDAGES/DRESSINGS) ×2
DURAPREP 26ML APPLICATOR (WOUND CARE) ×2 IMPLANT
ELECT REM PT RETURN 15FT ADLT (MISCELLANEOUS) ×2 IMPLANT
ELIMINATOR HOLE APEX DEPUY (Hips) ×2 IMPLANT
GLOVE BIOGEL M STRL SZ7.5 (GLOVE) ×4 IMPLANT
GLOVE BIOGEL PI IND STRL 7.5 (GLOVE) ×1 IMPLANT
GLOVE BIOGEL PI IND STRL 8.5 (GLOVE) ×1 IMPLANT
GLOVE BIOGEL PI INDICATOR 7.5 (GLOVE) ×1
GLOVE BIOGEL PI INDICATOR 8.5 (GLOVE) ×1
GLOVE ECLIPSE 8.0 STRL XLNG CF (GLOVE) ×4 IMPLANT
GLOVE ORTHO TXT STRL SZ7.5 (GLOVE) ×2 IMPLANT
GOWN STRL REUS W/TWL 2XL LVL3 (GOWN DISPOSABLE) ×2 IMPLANT
GOWN STRL REUS W/TWL LRG LVL3 (GOWN DISPOSABLE) ×2 IMPLANT
HEAD CERAMIC DELTA 36 PLUS 1.5 (Hips) ×2 IMPLANT
HOLDER FOLEY CATH W/STRAP (MISCELLANEOUS) ×2 IMPLANT
LINER NEUTRAL 54X36MM PLUS 4 (Hips) ×2 IMPLANT
PACK ANTERIOR HIP CUSTOM (KITS) ×2 IMPLANT
SCREW PINN CAN 6.5X20 (Screw) ×2 IMPLANT
STEM FEMORAL SZ5 HIGH ACTIS (Nail) ×2 IMPLANT
SUT MNCRL AB 4-0 PS2 18 (SUTURE) ×2 IMPLANT
SUT STRATAFIX 0 PDS 27 VIOLET (SUTURE) ×2
SUT VIC AB 1 CT1 36 (SUTURE) ×6 IMPLANT
SUT VIC AB 2-0 CT1 27 (SUTURE) ×2
SUT VIC AB 2-0 CT1 TAPERPNT 27 (SUTURE) ×2 IMPLANT
SUTURE STRATFX 0 PDS 27 VIOLET (SUTURE) ×1 IMPLANT
TRAY FOLEY MTR SLVR 16FR STAT (SET/KITS/TRAYS/PACK) ×2 IMPLANT
WATER STERILE IRR 1000ML POUR (IV SOLUTION) ×2 IMPLANT
YANKAUER SUCT BULB TIP 10FT TU (MISCELLANEOUS) ×2 IMPLANT

## 2018-09-12 NOTE — Op Note (Signed)
NAME:  Adam [name]                ACCOUNT NO.: [id number]      MEDICAL RECORD NO.: [id number]      FACILITY:  [facility]      PHYSICIAN:  Mauri Pole  DATE OF BIRTH:  11-14-1950     DATE OF PROCEDURE:  09/12/2018                                 OPERATIVE REPORT         PREOPERATIVE DIAGNOSIS: Right  hip osteoarthritis.      POSTOPERATIVE DIAGNOSIS:  Right hip osteoarthritis.      PROCEDURE:  Right total hip replacement through an anterior approach   utilizing DePuy THR system, component size 81mm pinnacle cup, a size 36+4 neutral   Altrex liner, a size 5 Hi Actis femoral stem with a 36+1.5 delta ceramic   ball.      SURGEON:  Pietro Cassis. Alvan Dame, M.D.      ASSISTANT:  Nehemiah Massed, PA-C     ANESTHESIA:  General.      SPECIMENS:  None.      COMPLICATIONS:  None.      BLOOD LOSS:  1000 cc     DRAINS:  None.      INDICATION OF THE PROCEDURE:  Adam [name] is a 67 y.o. male who had   presented to office for evaluation of right hip pain.  Radiographs revealed   progressive degenerative changes with bone-on-bone   articulation of the  hip joint, including subchondral cystic changes and osteophytes.  The patient had painful limited range of   motion significantly affecting their overall quality of life and function.  The patient was failing to    respond to conservative measures including medications and/or injections and activity modification and at this point was ready   to proceed with more definitive measures.  Consent was obtained for   benefit of pain relief.  Specific risks of infection, DVT, component   failure, dislocation, neurovascular injury, and need for revision surgery were reviewed in the office as well discussion of   the anterior versus posterior approach were reviewed.     PROCEDURE IN DETAIL:  The patient was brought to operative theater.   Once adequate anesthesia, preoperative antibiotics, 2 gm Ancef, 1 gm of Tranexamic Acid,  and 10 mg of Decadron were administered, the patient was positioned supine on the [employer] table.  Once the patient was safely positioned with adequate padding of boney prominences we predraped out the hip, and used fluoroscopy to confirm orientation of the pelvis.      The right hip was then prepped and draped from proximal iliac crest to   mid thigh with a shower curtain technique.      Time-out was performed identifying the patient, planned procedure, and the appropriate extremity.     An incision was then made 2 cm lateral to the   anterior superior iliac spine extending over the orientation of the   tensor fascia lata muscle and sharp dissection was carried down to the   fascia of the muscle.      The fascia was then incised.  The muscle belly was identified and swept   laterally and retractor placed along the superior neck.  Following   cauterization of the circumflex vessels and removing some pericapsular  fat, a second cobra retractor was placed on the inferior neck.  A T-capsulotomy was made along the line of the   superior neck to the trochanteric fossa, then extended proximally and   distally.  Tag sutures were placed and the retractors were then placed   intracapsular.  We then identified the trochanteric fossa and   orientation of my neck cut and then made a neck osteotomy with the femur on traction.  The femoral   head was removed without difficulty or complication.  Traction was let   off and retractors were placed posterior and anterior around the   acetabulum.      The labrum and foveal tissue were debrided.  I began reaming with a 46 mm   reamer and reamed up to 53 mm reamer with good bony bed preparation and a 54 mm  cup was chosen.  The final 54 mm Pinnacle cup was then impacted under fluoroscopy to confirm the depth of penetration and orientation with respect to   Abduction and forward flexion.  A screw was placed into the ilium followed by the hole eliminator.  The  final   36+4 neutral Altrex liner was impacted with good visualized rim fit.  The cup was positioned anatomically within the acetabular portion of the pelvis.      At this point, the femur was rolled to 100 degrees.  Further capsule was   released off the inferior aspect of the femoral neck.  I then   released the superior capsule proximally.  With the leg in a neutral position the hook was placed laterally   along the femur under the vastus lateralis origin and elevated manually and then held in position using the hook attachment on the bed.  The leg was then extended and adducted with the leg rolled to 100   degrees of external rotation.  Retractors were placed along the medial calcar and posteriorly over the greater trochanter.  Once the proximal femur was fully   exposed, I used a box osteotome to set orientation.  I then began   broaching with the starting chili pepper broach and passed this by hand and then broached up to 5.  With the 5 broach in place I chose a high offset neck and did several trial reductions.  The offset was appropriate, leg lengths   appeared to be equal best matched with the high offset head ball trial confirmed radiographically.   Given these findings, I went ahead and dislocated the hip, repositioned all   retractors and positioned the right hip in the extended and abducted position.  The final 5 Hi Actis femoral stem was   chosen and it was impacted down to the level of neck cut.  Based on this   and the trial reductions, a final 36+1.5 delta ceramic ball was chosen and   impacted onto a clean and dry trunnion, and the hip was reduced.  The   hip had been irrigated throughout the case again at this point.  I did   reapproximate the superior capsular leaflet to the anterior leaflet   using #1 Vicryl.  The fascia of the   tensor fascia lata muscle was then reapproximated using #1 Vicryl and #0 Stratafix sutures.  The   remaining wound was closed with 2-0 Vicryl and  running 4-0 Monocryl.   The hip was cleaned, dried, and dressed sterilely using Dermabond and   Aquacel dressing.  The patient was then brought   to recovery  room in stable condition tolerating the procedure well.    Nehemiah Massed, PA-C was present for the entirety of the case involved from   preoperative positioning, perioperative retractor management, general   facilitation of the case, as well as primary wound closure as assistant.            Pietro Cassis Alvan Dame, M.D.        09/12/2018 2:51 PM

## 2018-09-12 NOTE — Transfer of Care (Signed)
Immediate Anesthesia Transfer of Care Note  Patient: Adam [name]  Procedure(s) Performed: TOTAL HIP ARTHROPLASTY ANTERIOR APPROACH (Right )  Patient Location: PACU  Anesthesia Type:General  Level of Consciousness: awake, alert  and oriented  Airway & Oxygen Therapy: Patient Spontanous Breathing and Patient connected to face mask oxygen  Post-op Assessment: Report given to RN and Post -op Vital signs reviewed and stable  Post vital signs: Reviewed and stable  Last Vitals:  Vitals Value Taken Time  BP    Temp    Pulse    Resp    SpO2      Last Pain:  Vitals:   09/12/18 1211  TempSrc: Oral         Complications: No apparent anesthesia complications

## 2018-09-12 NOTE — Discharge Instructions (Signed)

## 2018-09-12 NOTE — Anesthesia Procedure Notes (Signed)
Procedure Name: Intubation Date/Time: 09/12/2018 2:35 PM Performed by: Mitzie Na, CRNA Pre-anesthesia Checklist: Patient identified, Emergency Drugs available, Suction available, Patient being monitored and Timeout performed Patient Re-evaluated:Patient Re-evaluated prior to induction Oxygen Delivery Method: Circle system utilized Preoxygenation: Pre-oxygenation with 100% oxygen Induction Type: IV induction and Rapid sequence Laryngoscope Size: Glidescope and 3 Grade View: Grade I Tube size: 7.5 mm Number of attempts: 1 Airway Equipment and Method: Video-laryngoscopy Placement Confirmation: breath sounds checked- equal and bilateral and positive ETCO2 Secured at: 25 cm Tube secured with: Tape Dental Injury: Teeth and Oropharynx as per pre-operative assessment

## 2018-09-12 NOTE — Anesthesia Postprocedure Evaluation (Signed)
Anesthesia Post Note  Patient: Adam [name]  Procedure(s) Performed: TOTAL HIP ARTHROPLASTY ANTERIOR APPROACH (Right )     Patient location during evaluation: PACU Anesthesia Type: General Level of consciousness: awake Pain management: pain level controlled Vital Signs Assessment: post-procedure vital signs reviewed and stable Respiratory status: spontaneous breathing Cardiovascular status: stable Postop Assessment: no headache    Last Vitals:  Vitals:   09/12/18 1211 09/12/18 1646  BP: 139/86 (!) 150/100  Pulse: 83 94  Resp: 18 15  Temp: 37.1 C 36.9 C  SpO2: 96% 98%    Last Pain:  Vitals:   09/12/18 1646  TempSrc:   PainSc: 8                  Arnoldo Hildreth

## 2018-09-12 NOTE — Anesthesia Preprocedure Evaluation (Signed)
Anesthesia Evaluation  Patient identified by MRN, date of birth, ID band Patient awake    Reviewed: Allergy & Precautions, NPO status , Patient's Chart, lab work & pertinent test results  Airway Mallampati: II  TM Distance: >3 FB     Dental   Pulmonary asthma , sleep apnea , COPD, former smoker,    breath sounds clear to auscultation       Cardiovascular hypertension, + CAD   Rhythm:Regular Rate:Normal     Neuro/Psych    GI/Hepatic Neg liver ROS, GERD  ,Patient received Oral Contrast Agents,  Endo/Other  diabetes  Renal/GU negative Renal ROS     Musculoskeletal   Abdominal   Peds  Hematology   Anesthesia Other Findings   Reproductive/Obstetrics                             Anesthesia Physical Anesthesia Plan  ASA: III  Anesthesia Plan: General   Post-op Pain Management:    Induction: Intravenous  PONV Risk Score and Plan: 2 and Dexamethasone, Ondansetron and Midazolam  Airway Management Planned: Oral ETT  Additional Equipment:   Intra-op Plan:   Post-operative Plan: Possible Post-op intubation/ventilation  Informed Consent: I have reviewed the patients History and Physical, chart, labs and discussed the procedure including the risks, benefits and alternatives for the proposed anesthesia with the patient or authorized representative who has indicated his/her understanding and acceptance.   Dental advisory given  Plan Discussed with: CRNA and Anesthesiologist  Anesthesia Plan Comments:         Anesthesia Quick Evaluation

## 2018-09-12 NOTE — Interval H&P Note (Signed)
History and Physical Interval Note:  09/12/2018 1:10 PM  Adam [name]  has presented today for surgery, with the diagnosis of Right hip osteoarthritis  The various methods of treatment have been discussed with the patient and family. After consideration of risks, benefits and other options for treatment, the patient has consented to  Procedure(s) with comments: TOTAL HIP ARTHROPLASTY ANTERIOR APPROACH (Right) - 70 mins as a surgical intervention .  The patient's history has been reviewed, patient examined, no change in status, stable for surgery.  I have reviewed the patient's chart and labs.  Questions were answered to the patient's satisfaction.     Mauri Pole

## 2018-09-13 LAB — CBC
HCT: 33.2 % — ABNORMAL LOW (ref 39.0–52.0)
HEMOGLOBIN: 11.3 g/dL — AB (ref 13.0–17.0)
MCH: 34.1 pg — ABNORMAL HIGH (ref 26.0–34.0)
MCHC: 34 g/dL (ref 30.0–36.0)
MCV: 100.3 fL — ABNORMAL HIGH (ref 80.0–100.0)
Platelets: 181 10*3/uL (ref 150–400)
RBC: 3.31 MIL/uL — ABNORMAL LOW (ref 4.22–5.81)
RDW: 12.2 % (ref 11.5–15.5)
WBC: 9.6 10*3/uL (ref 4.0–10.5)
nRBC: 0 % (ref 0.0–0.2)

## 2018-09-13 LAB — BASIC METABOLIC PANEL
Anion gap: 11 (ref 5–15)
BUN: 15 mg/dL (ref 8–23)
CO2: 25 mmol/L (ref 22–32)
Calcium: 8.8 mg/dL — ABNORMAL LOW (ref 8.9–10.3)
Chloride: 102 mmol/L (ref 98–111)
Creatinine, Ser: 0.97 mg/dL (ref 0.61–1.24)
GFR calc Af Amer: >60 mL/min (ref >60)
GFR calc non Af Amer: >60 mL/min (ref >60)
Glucose, Bld: 208 mg/dL — ABNORMAL HIGH (ref 70–99)
POTASSIUM: 4.1 mmol/L (ref 3.5–5.1)
Sodium: 138 mmol/L (ref 135–145)

## 2018-09-13 LAB — GLUCOSE, CAPILLARY
GLUCOSE-CAPILLARY: 187 mg/dL — AB (ref 70–99)
Glucose-Capillary: 231 mg/dL — ABNORMAL HIGH (ref 70–99)

## 2018-09-13 NOTE — Progress Notes (Signed)
Reviewed discharge prescriptions and instructions. Removed IV. Patient and wife have no further questions.

## 2018-09-13 NOTE — Progress Notes (Signed)
     Subjective: 1 Day Post-Op Procedure(s) (LRB): TOTAL HIP ARTHROPLASTY ANTERIOR APPROACH (Right)   Patient reports pain as mild, pain controlled. Some pain yesterday and throughout the night, but better his morning.  No events throughout the night.  Ready to be discharged home.   Objective:   VITALS:   Vitals:   09/13/18 0453 09/13/18 0842  BP: 112/75 123/76  Pulse: 78 81  Resp: 16 16  Temp: 97.7 F (36.5 C) 98.2 F (36.8 C)  SpO2:      Dorsiflexion/Plantar flexion intact Incision: dressing C/D/I No cellulitis present Compartment soft  LABS Recent Labs    09/13/18 0354  HGB 11.3*  HCT 33.2*  WBC 9.6  PLT 181    Recent Labs    09/13/18 0354  NA 138  K 4.1  BUN 15  CREATININE 0.97  GLUCOSE 208*     Assessment/Plan: 1 Day Post-Op Procedure(s) (LRB): TOTAL HIP ARTHROPLASTY ANTERIOR APPROACH (Right) Foley cath d/c'ed Advance diet Up with therapy D/C IV fluids Discharge home Follow up in 2 weeks at [facility] ([place]). Follow up with OLIN,Ewen Varnell D in 2 weeks.  Contact information:  [facility]) [address] [place] [phone]    Obese (BMI 30-39.9) Estimated body mass index is 36.71 kg/m as calculated from the following:   Height as of this encounter: 5' 10.5" (1.791 m).   Weight as of this encounter: 117.7 kg. Patient also counseled that weight may inhibit the healing process Patient counseled that losing weight will help with future health issues         West Pugh. Airlie Blumenberg   PAC  09/13/2018, 9:03 AM

## 2018-09-13 NOTE — Evaluation (Signed)
Physical Therapy Evaluation Patient Details Name: Adam [name] MRN: [id number] DOB: [birth year] Today's Date: 09/13/2018   History of Present Illness  Pt is a 67 year old male s/p Right direct anterior THA with hx of L THA (posterior approach per pt), DM, COPD, OSA  Clinical Impression  Pt is s/p THA resulting in the deficits listed below (see PT Problem List).  Pt will benefit from skilled PT to increase their independence and safety with mobility to allow discharge to the venue listed below.   Pt assisted with ambulating in hallway and fatigued quickly (pt reports due to hx of COPD).  Pt would like to d/c home today if possible however will need to review exercises and steps prior to d/c.  Pt requested to rest after ambulating this session.     Follow Up Recommendations Follow surgeon's recommendation for DC plan and follow-up therapies(pt reports no f/u planned)    Equipment Recommendations  Rolling walker with 5" wheels    Recommendations for Other Services       Precautions / Restrictions Precautions Precautions: Fall Restrictions Weight Bearing Restrictions: No Other Position/Activity Restrictions: WBAT      Mobility  Bed Mobility Overal bed mobility: Needs Assistance Bed Mobility: Supine to Sit     Supine to sit: HOB elevated;Min guard     General bed mobility comments: slow and effortful however no physical assist required  Transfers Overall transfer level: Needs assistance Equipment used: Rolling walker (2 wheeled) Transfers: Sit to/from Stand Sit to Stand: Min guard;From elevated surface         General transfer comment: verbal cues for UE and LE positioning, min/guard for safety  Ambulation/Gait Ambulation/Gait assistance: Min guard Gait Distance (Feet): 140 Feet Assistive device: Rolling walker (2 wheeled) Gait Pattern/deviations: Step-to pattern;Decreased stance time - right;Antalgic     General Gait Details: verbal cues for sequence, RW  positioning, step length  Stairs            Wheelchair Mobility    Modified Rankin (Stroke Patients Only)       Balance                                             Pertinent Vitals/Pain Pain Assessment: 0-10 Pain Score: 5  Pain Location: right hip Pain Descriptors / Indicators: Sore;Aching Pain Intervention(s): Limited activity within patient's tolerance;Repositioned;Monitored during session;Premedicated before session;Ice applied    Home Living Family/patient expects to be discharged to:: Private residence Living Arrangements: Spouse/significant other   Type of Home: House Home Access: Stairs to enter   Technical brewer of Steps: 3 Home Layout: One level Home Equipment: None      Prior Function Level of Independence: Independent               Hand Dominance        Extremity/Trunk Assessment        Lower Extremity Assessment Lower Extremity Assessment: RLE deficits/detail RLE Deficits / Details: anticipated post op hip weakness       Communication   Communication: HOH  Cognition Arousal/Alertness: Awake/alert Behavior During Therapy: WFL for tasks assessed/performed Overall Cognitive Status: Within Functional Limits for tasks assessed  General Comments      Exercises     Assessment/Plan    PT Assessment Patient needs continued PT services  PT Problem List Decreased strength;Decreased mobility;Decreased balance;Decreased knowledge of use of DME;Decreased activity tolerance;Pain       PT Treatment Interventions Stair training;Gait training;Therapeutic exercise;DME instruction;Therapeutic activities;Patient/family education;Functional mobility training    PT Goals (Current goals can be found in the Care Plan section)  Acute Rehab PT Goals PT Goal Formulation: With patient Time For Goal Achievement: 09/16/18 Potential to Achieve Goals: Good     Frequency 7X/week   Barriers to discharge        Co-evaluation               AM-PAC PT "6 Clicks" Mobility  Outcome Measure Help needed turning from your back to your side while in a flat bed without using bedrails?: A Little Help needed moving from lying on your back to sitting on the side of a flat bed without using bedrails?: A Little Help needed moving to and from a bed to a chair (including a wheelchair)?: A Little Help needed standing up from a chair using your arms (e.g., wheelchair or bedside chair)?: A Little Help needed to walk in hospital room?: A Little Help needed climbing 3-5 steps with a railing? : A Lot 6 Click Score: 17    End of Session Equipment Utilized During Treatment: Gait belt Activity Tolerance: Patient limited by fatigue Patient left: in chair;with call bell/phone within reach   PT Visit Diagnosis: Other abnormalities of gait and mobility (R26.89)    Time: 1829-9371 PT Time Calculation (min) (ACUTE ONLY): 14 min   Charges:   PT Evaluation $PT Eval Low Complexity: [place], PT, DPT Acute Rehabilitation Services Office: [phone] Pager: [phone]   Trena Platt 09/13/2018, 12:27 PM

## 2018-09-13 NOTE — Plan of Care (Signed)
Pt is stable. Pain management in progress, effective.    Problem: Education: Goal: Knowledge of General Education information will improve Description Including pain rating scale, medication(s)/side effects and non-pharmacologic comfort measures Outcome: Progressing   Problem: Health Behavior/Discharge Planning: Goal: Ability to manage health-related needs will improve Outcome: Progressing   Problem: Clinical Measurements: Goal: Ability to maintain clinical measurements within normal limits will improve Outcome: Progressing Goal: Will remain free from infection Outcome: Progressing Goal: Diagnostic test results will improve Outcome: Progressing Goal: Respiratory complications will improve Outcome: Progressing Goal: Cardiovascular complication will be avoided Outcome: Progressing   Problem: Activity: Goal: Risk for activity intolerance will decrease Outcome: Progressing   Problem: Nutrition: Goal: Adequate nutrition will be maintained Outcome: Progressing   Problem: Coping: Goal: Level of anxiety will decrease Outcome: Progressing   Problem: Elimination: Goal: Will not experience complications related to bowel motility Outcome: Progressing Goal: Will not experience complications related to urinary retention Outcome: Progressing   Problem: Pain Managment: Goal: General experience of comfort will improve Outcome: Progressing   Problem: Safety: Goal: Ability to remain free from injury will improve Outcome: Progressing   Problem: Skin Integrity: Goal: Risk for impaired skin integrity will decrease Outcome: Progressing

## 2018-09-13 NOTE — Progress Notes (Signed)
Physical Therapy Treatment Patient Details Name: Adam [name] MRN: [id number] DOB: 11/29/1950 Today's Date: 09/13/2018    History of Present Illness Pt is a 67 year old male s/p Right direct anterior THA with hx of L THA (posterior approach per pt), DM, COPD, OSA    PT Comments    Pt ambulated in hallway again and practiced safe stair technique with spouse observing.  Pt provided with HEP handout and reviewed exercises and safety in detail with pt; pt did not feel he needed to perform exercises prior to d/c.  Pt's questions answered within scope of practice, and pt feels ready for d/c home today.   Follow Up Recommendations  Follow surgeon's recommendation for DC plan and follow-up therapies     Equipment Recommendations  Rolling walker with 5" wheels(has RW script in hand, plans to stop on way home)    Recommendations for Other Services       Precautions / Restrictions Precautions Precautions: Fall Restrictions Other Position/Activity Restrictions: WBAT    Mobility  Bed Mobility Overal bed mobility: Needs Assistance Bed Mobility: Supine to Sit     Supine to sit: HOB elevated;Min guard     General bed mobility comments: pt up in recliner on arrival  Transfers Overall transfer level: Needs assistance Equipment used: Rolling walker (2 wheeled) Transfers: Sit to/from Stand Sit to Stand: Min guard         General transfer comment: verbal cues for UE and LE positioning, min/guard for safety, emphasized safe technique  Ambulation/Gait Ambulation/Gait assistance: Min guard Gait Distance (Feet): 140 Feet Assistive device: Rolling walker (2 wheeled) Gait Pattern/deviations: Step-to pattern;Decreased stance time - right;Antalgic     General Gait Details: verbal cues for sequence, RW positioning, step length   Stairs Stairs: Yes Stairs assistance: Min guard Stair Management: Step to pattern;Forwards;Two rails Number of Stairs: 3 General stair comments:  verbal cues for sequence, safety; spouse present and observed, pt reports understanding   Wheelchair Mobility    Modified Rankin (Stroke Patients Only)       Balance                                            Cognition Arousal/Alertness: Awake/alert Behavior During Therapy: WFL for tasks assessed/performed Overall Cognitive Status: Within Functional Limits for tasks assessed                                        Exercises      General Comments        Pertinent Vitals/Pain Pain Assessment: 0-10 Pain Score: 4  Pain Location: right hip Pain Descriptors / Indicators: Sore;Aching Pain Intervention(s): Repositioned;Limited activity within patient's tolerance;Monitored during session    Home Living Family/patient expects to be discharged to:: Private residence Living Arrangements: Spouse/significant other   Type of Home: House Home Access: Stairs to enter   Home Layout: One level Home Equipment: None      Prior Function Level of Independence: Independent          PT Goals (current goals can now be found in the care plan section) Acute Rehab PT Goals PT Goal Formulation: With patient Time For Goal Achievement: 09/16/18 Potential to Achieve Goals: Good Progress towards PT goals: Progressing toward goals    Frequency    7X/week  PT Plan Current plan remains appropriate    Co-evaluation              AM-PAC PT "6 Clicks" Mobility   Outcome Measure  Help needed turning from your back to your side while in a flat bed without using bedrails?: A Little Help needed moving from lying on your back to sitting on the side of a flat bed without using bedrails?: A Little Help needed moving to and from a bed to a chair (including a wheelchair)?: A Little Help needed standing up from a chair using your arms (e.g., wheelchair or bedside chair)?: A Little Help needed to walk in hospital room?: A Little Help needed climbing  3-5 steps with a railing? : A Little 6 Click Score: 18    End of Session Equipment Utilized During Treatment: Gait belt Activity Tolerance: Patient tolerated treatment well Patient left: in chair;with call bell/phone within reach;with family/visitor present   PT Visit Diagnosis: Other abnormalities of gait and mobility (R26.89)     Time: 3729-0211 PT Time Calculation (min) (ACUTE ONLY): 19 min  Charges:  $Gait Training: 8-22 mins                    Carmelia Bake, PT, DPT Acute Rehabilitation Services Office: [phone] Pager: [place] E 09/13/2018, 3:10 PM

## 2018-09-15 ENCOUNTER — Encounter (HOSPITAL_COMMUNITY): Payer: Self-pay | Admitting: Orthopedic Surgery

## 2018-09-19 NOTE — Discharge Summary (Signed)
Physician Discharge Summary  Patient ID: Adam [name] MRN: [id number] DOB/AGE: 05/09/1951 67 y.o.  Admit date: 09/12/2018 Discharge date: 09/13/2018   Procedures:  Procedure(s) (LRB): TOTAL HIP ARTHROPLASTY ANTERIOR APPROACH (Right)  Attending Physician:  Dr. [name]   Admission Diagnoses:   Right hip primary OA / pain  Discharge Diagnoses:  Principal Problem:   S/P right THA, AA Active Problems:   Obesity (BMI 30-39.9)   S/P hip replacement  Past Medical History:  Diagnosis Date  . Allergy   . Anxiety    history of PTSD following CABG  . Ascending aortic aneurysm ([place]) 01/31/2018   43 x 42 mm, pt unaware  . Asthma   . Cardiomegaly 10/17/2017  . Colitis- colonoscopy 2014 07/13/2015  . COPD (chronic obstructive pulmonary disease) ([place])   . Coronary artery disease    x 6  . Depression   . Diabetes mellitus without complication ([place])   . Family history of polyps in the colon   . Finger dislocation    Left pinkie  . GERD (gastroesophageal reflux disease)   . Gout   . H/O atrial fibrillation without current medication    following CABG with no documented episodes since then.  . Heart palpitations   . Hx of adenomatous colonic polyps 08/12/2010  . Hyperlipidemia   . Hypertension   . OA (osteoarthritis)   . OSA (obstructive sleep apnea)    Mild, has not received CPAP yet  . Prediabetes   . RLS (restless legs syndrome)   . 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 male, has a history of pain and functional disability in the right hip(s) due to arthritis and patient has failed non-surgical conservative treatments for greater than 12 weeks to include NSAID's and/or analgesics, corticosteriod injections and activity modification.  Onset of symptoms was gradual starting <1 year ago with gradually worsening course since that time.The patient noted prior procedures of the hip to include arthroplasty on the left hip in 2013  while living in [place].  Patient currently rates pain in the right hip at 10 out of 10 with activity. Patient has worsening of pain with activity and weight bearing, trendelenberg gait, pain that interfers with activities of daily living and pain with passive range of motion. Patient has evidence of periarticular osteophytes and joint space narrowing by imaging studies. This condition presents safety issues increasing the risk of falls. There is no current active infection.  Risks, benefits and expectations were discussed with the patient.  Risks including but not limited to the risk of anesthesia, blood clots, nerve damage, blood vessel damage, failure of the prosthesis, infection and up to and including death.  Patient understand the risks, benefits and expectations and wishes to proceed with surgery.    PCP: [name], MD   Discharged Condition: good  Hospital Course:  Patient underwent the above stated procedure on 09/12/2018. Patient tolerated the procedure well and brought to the recovery room in good condition and subsequently to the floor.  POD #1 BP: 123/76 ; Pulse: 81 ; Temp: 98.2 F (36.8 C) ; Resp: 16 Patient reports pain as mild, pain controlled. Some pain yesterday and throughout the night, but better his morning.  No events throughout the night.  Ready to be discharged home. Dorsiflexion/plantar flexion intact, incision: dressing C/D/I, no cellulitis present and compartment soft.   LABS  Basename    HGB  11.3  HCT     33.2    Discharge Exam: General appearance: alert, cooperative and no distress Extremities: Homans sign is negative, no sign of DVT, no edema, redness or tenderness in the calves or thighs and no ulcers, gangrene or trophic changes  Disposition:  Home with follow up in 2 weeks   Follow-up Information    [name], MD. Schedule an appointment as soon as possible for a visit in 2 weeks.   Specialty:  Orthopedic Surgery Contact  information: [address] [place] [address] [phone]           Discharge Instructions    Call MD / Call 911   Complete by:  As directed    If you experience chest pain or shortness of breath, CALL 911 and be transported to the hospital emergency room.  If you develope a fever above 101 F, pus (white drainage) or increased drainage or redness at the wound, or calf pain, call your surgeon's office.   Change dressing   Complete by:  As directed    Maintain surgical dressing until follow up in the clinic. If the edges start to pull up, may reinforce with tape. If the dressing is no longer working, may remove and cover with gauze and tape, but must keep the area dry and clean.  Call with any questions or concerns.   Constipation Prevention   Complete by:  As directed    Drink plenty of fluids.  Prune juice may be helpful.  You may use a stool softener, such as Colace (over the counter) 100 mg twice a day.  Use MiraLax (over the counter) for constipation as needed.   Diet - low sodium heart healthy   Complete by:  As directed    Discharge instructions   Complete by:  As directed    Maintain surgical dressing until follow up in the clinic. If the edges start to pull up, may reinforce with tape. If the dressing is no longer working, may remove and cover with gauze and tape, but must keep the area dry and clean.  Follow up in 2 weeks at [facility]. Call with any questions or concerns.   Increase activity slowly as tolerated   Complete by:  As directed    Weight bearing as tolerated with assist device (walker, cane, etc) as directed, use it as long as suggested by your surgeon or therapist, typically at least 4-6 weeks.   TED hose   Complete by:  As directed    Use stockings (TED hose) for 2 weeks on both leg(s).  You may remove them at night for sleeping.      Allergies as of 09/13/2018      Reactions   Amoxicillin Rash, Other (See Comments)   *  SEVERE RASH IN GROIN AREA Has patient had a PCN reaction causing immediate rash, facial/tongue/throat swelling, SOB or lightheadedness with hypotension: no Has patient had a PCN reaction causing severe rash involving mucus membranes or skin necrosis: no Has patient had a PCN reaction that required hospitalization: no Has patient had a PCN reaction occurring within the last 10 years: yes If all of the above answers are "NO", then may proceed with Cephalosporin use.   Augmentin [amoxicillin-pot Clavulanate] Rash, Other (See Comments)   * SEVERE RASH IN GROIN AREA   Azithromycin Rash, Other (See Comments)   * SEVERE RASH IN GROIN AREA   Clindamycin/lincomycin Rash   Keflex [cephalexin] Rash  Medication List    STOP taking these medications   HYDROcodone-homatropine 5-1.5 MG/5ML syrup Commonly known as:  HYCODAN     TAKE these medications   allopurinol 300 MG tablet Commonly known as:  ZYLOPRIM TAKE 1 TABLET BY MOUTH EVERY DAY   BREO ELLIPTA 100-25 MCG/INH Aepb Generic drug:  fluticasone furoate-vilanterol INHALE 1 PUFF INTO THE LUNGS DAILY What changed:  See the new instructions.   Co Q-10 100 MG Caps Take 100 mg by mouth daily.   docusate sodium 100 MG capsule Commonly known as:  COLACE Take 1 capsule (100 mg total) by mouth 2 (two) times daily.   dofetilide 125 MCG capsule Commonly known as:  TIKOSYN Take 1 capsule (125 mcg total) by mouth every 12 (twelve) hours.   doxycycline 100 MG tablet Commonly known as:  VIBRA-TABS Take 1 tablet (100 mg total) by mouth 2 (two) times daily.   econazole nitrate 1 % cream Apply 1 application topically every other day.   fenofibrate 160 MG tablet TAKE 1 TABLET(160 MG) BY MOUTH DAILY   ferrous sulfate 325 (65 FE) MG tablet Commonly known as:  FERROUSUL Take 1 tablet (325 mg total) by mouth 3 (three) times daily with meals.   fluticasone 50 MCG/ACT nasal spray Commonly known as:  FLONASE Place 1 spray into both nostrils  daily.   glucose blood test strip Commonly known as:  ONETOUCH VERIO Test once daily.  Dx E11.9   hydrochlorothiazide 25 MG tablet Commonly known as:  HYDRODIURIL Take 1 tablet (25 mg total) by mouth daily.   HYDROcodone-acetaminophen 7.5-325 MG tablet Commonly known as:  NORCO Take 1-2 tablets by mouth every 4 (four) hours as needed for moderate pain.   HYDROcodone-acetaminophen 7.5-325 MG tablet Commonly known as:  NORCO Take 1-2 tablets by mouth every 4 (four) hours as needed for moderate pain.   Insulin Glargine 300 UNIT/ML Sopn Commonly known as:  TOUJEO SOLOSTAR Inject 60 Units as directed daily. What changed:  how to take this   JARDIANCE 25 MG Tabs tablet Generic drug:  empagliflozin TAKE 1 TABLET BY MOUTH DAILY What changed:  how much to take   LORazepam 0.5 MG tablet Commonly known as:  ATIVAN Take 1 tablet (0.5 mg total) by mouth every 8 (eight) hours as needed for anxiety.   losartan 100 MG tablet Commonly known as:  COZAAR Take 1 tablet (100 mg total) by mouth daily.   metFORMIN 1000 MG tablet Commonly known as:  GLUCOPHAGE TAKE 1 TABLET BY MOUTH TWICE DAILY WITH A MEAL What changed:  See the new instructions.   methocarbamol 500 MG tablet Commonly known as:  ROBAXIN Take 1 tablet (500 mg total) by mouth every 6 (six) hours as needed for muscle spasms.   metoprolol tartrate 100 MG tablet Commonly known as:  LOPRESSOR Take 100 mg by mouth 2 (two) times daily.   montelukast 10 MG tablet Commonly known as:  SINGULAIR TAKE 1 TABLET BY MOUTH AT BEDTIME   Olopatadine HCl 0.2 % Soln Commonly known as:  PATADAY Apply 1 drop to eye as needed. What changed:    how to take this  when to take this  reasons to take this   pantoprazole 40 MG tablet Commonly known as:  PROTONIX TAKE 1 TABLET BY MOUTH TWICE DAILY   polyethylene glycol packet Commonly known as:  MIRALAX / GLYCOLAX Take 17 g by mouth 2 (two) times daily.   potassium chloride SA 20  MEQ tablet Commonly known as:  K-DUR,KLOR-CON  Take 2 tablets (40 mEq total) by mouth daily.   pramipexole 0.25 MG tablet Commonly known as:  MIRAPEX TAKE 1 TABLET(0.25 MG) BY MOUTH AT BEDTIME What changed:  See the new instructions.   ranitidine 150 MG capsule Commonly known as:  ZANTAC Take 150 mg by mouth every evening.   simvastatin 80 MG tablet Commonly known as:  ZOCOR TAKE 1 TABLET BY MOUTH EVERY DAY   SPIRIVA HANDIHALER 18 MCG inhalation capsule Generic drug:  tiotropium INHALE CONTENTS OF 1 CAPSULE ONCE DAILY USING HANDIHALER What changed:  See the new instructions.   XARELTO 20 MG Tabs tablet Generic drug:  rivaroxaban TAKE 1 TABLET BY MOUTH DAILY WITH SUPPER   ZZZQUIL 25 MG Caps Generic drug:  diphenhydrAMINE HCl (Sleep) Take 25 mg by mouth at bedtime as needed (for sleep).            Discharge Care Instructions  (From admission, onward)         Start     Ordered   09/13/18 0000  Change dressing    Comments:  Maintain surgical dressing until follow up in the clinic. If the edges start to pull up, may reinforce with tape. If the dressing is no longer working, may remove and cover with gauze and tape, but must keep the area dry and clean.  Call with any questions or concerns.   09/13/18 8022           Signed: West Pugh. Lux Meaders   PA-C  09/19/2018, 3:45 PM

## 2018-09-26 ENCOUNTER — Telehealth: Payer: Self-pay

## 2018-09-26 ENCOUNTER — Other Ambulatory Visit: Payer: Self-pay | Admitting: Family Medicine

## 2018-09-26 NOTE — Telephone Encounter (Signed)
Calhouled patient and he stated that he has a lot of mucous and as the day goes on he just does not feel good. He is sleeping a lot and he stated that it does not feel like AFIB except being tired and sleeps in the afternoon for about 4 hours. He states that everything tastes sour.  Please advise.

## 2018-09-26 NOTE — Telephone Encounter (Signed)
Advice ER if symptoms worsen and cannot wait until Thursday (eg chest pain, dyspnea, fever, vomiting).

## 2018-09-26 NOTE — Telephone Encounter (Signed)
Copied from [place] [phone]. Topic: General - Other >> Sep 26, 2018 12:20 PM Keene Breath wrote: Reason for CRM: Patient called to request that an order for blood work be submitted for patient.  He stated that he is not feeling good and would like to see what's going on.  CB# [phone].

## 2018-09-26 NOTE — Telephone Encounter (Signed)
Needs to be seen

## 2018-09-26 NOTE — Telephone Encounter (Signed)
Please see message. °

## 2018-09-26 NOTE — Telephone Encounter (Signed)
Called patient and gave him advise from Dr. Elease Hashimoto. Patient verbalized an understanding and he has an appointment with Dr. Sarajane Jews on Friday.

## 2018-09-29 ENCOUNTER — Encounter: Payer: Self-pay | Admitting: Family Medicine

## 2018-09-29 ENCOUNTER — Ambulatory Visit: Payer: Medicare Other | Admitting: Family Medicine

## 2018-09-29 VITALS — BP 118/60 | HR 94 | Temp 97.7°F | Wt 256.1 lb

## 2018-09-29 DIAGNOSIS — I4819 Other persistent atrial fibrillation: Secondary | ICD-10-CM | POA: Diagnosis not present

## 2018-09-29 DIAGNOSIS — J069 Acute upper respiratory infection, unspecified: Secondary | ICD-10-CM | POA: Diagnosis not present

## 2018-09-29 NOTE — Progress Notes (Signed)
   Subjective:    Patient ID: [name], male    DOB: 10-23-1950, 67 y.o.   MRN: [id number]  HPI Here for 2 days of stuffy head, PND, and nausea without vomiting. No fever or cough. No chest pain or SOB. He is worried he may be in atrial fibrillation again (he had a successful ablation in July).    Review of Systems  Constitutional: Positive for fatigue. Negative for fever.  HENT: Positive for congestion and postnasal drip. Negative for ear pain, sinus pressure and sinus pain.   Eyes: Negative.   Respiratory: Negative.   Cardiovascular: Negative.   Gastrointestinal: Positive for nausea. Negative for abdominal distention, abdominal pain, blood in stool, constipation, diarrhea and vomiting.       Objective:   Physical Exam Constitutional:      Appearance: Normal appearance. He is not ill-appearing.  HENT:     Right Ear: Tympanic membrane and ear canal normal.     Left Ear: Tympanic membrane and ear canal normal.     Nose: Nose normal.     Mouth/Throat:     Pharynx: Oropharynx is clear.  Eyes:     Conjunctiva/sclera: Conjunctivae normal.  Cardiovascular:     Rate and Rhythm: Normal rate and regular rhythm.     Pulses: Normal pulses.     Heart sounds: Normal heart sounds.     Comments: EKG shows sinus rhythm  Pulmonary:     Effort: Pulmonary effort is normal.     Breath sounds: Normal breath sounds.  Lymphadenopathy:     Cervical: No cervical adenopathy.  Neurological:     Mental Status: He is alert.           Assessment & Plan:  He has a viral URI and the drainage is causing some nausea. He can drink fluids and use Mucinex prn. I reassured him he is not in atrial fib again.  Alysia Penna, MD

## 2018-10-06 ENCOUNTER — Encounter: Payer: Self-pay | Admitting: Primary Care

## 2018-10-06 ENCOUNTER — Ambulatory Visit: Payer: Medicare Other | Admitting: Primary Care

## 2018-10-06 ENCOUNTER — Ambulatory Visit (INDEPENDENT_AMBULATORY_CARE_PROVIDER_SITE_OTHER)
Admission: RE | Admit: 2018-10-06 | Discharge: 2018-10-06 | Disposition: A | Discharge status: Home / Self Care | Payer: Medicare Other | Source: Ambulatory Visit | Attending: Primary Care | Admitting: Primary Care

## 2018-10-06 VITALS — BP 114/68 | HR 89 | Temp 97.6°F | Ht 70.5 in | Wt 255.0 lb

## 2018-10-06 DIAGNOSIS — R042 Hemoptysis: Secondary | ICD-10-CM

## 2018-10-06 DIAGNOSIS — R05 Cough: Secondary | ICD-10-CM | POA: Diagnosis not present

## 2018-10-06 LAB — CBC
HCT: 36.7 % — ABNORMAL LOW (ref 39.0–52.0)
HEMOGLOBIN: 12.6 g/dL — AB (ref 13.0–17.0)
MCHC: 34.4 g/dL (ref 30.0–36.0)
MCV: 100.7 fl — ABNORMAL HIGH (ref 78.0–100.0)
Platelets: 215 10*3/uL (ref 150.0–400.0)
RBC: 3.64 Mil/uL — ABNORMAL LOW (ref 4.22–5.81)
RDW: 13.7 % (ref 11.5–15.5)
WBC: 4.1 10*3/uL (ref 4.0–10.5)

## 2018-10-06 NOTE — Progress Notes (Signed)
@Patient  ID: [name], male    DOB: Apr 11, 1951, 68 y.o.   MRN: [id number]  Chief Complaint  Patient presents with  . Acute Visit    hemoptysis for 3 mornings    Referring provider: [name], MD  HPI: 68 year old male, former smoker quit in 1979. PMH significant for COPD with asthmatic features, chronic cough, allergic rhinitis, hypertension, CAD/CABG. Patient had a prolonged bought of bronchitis back in December 2018 that lasted a few weeks and required 3 rounds of antibiotics to clear. Patient of Dr. Lamonte Sakai.  Previous [place] Encounter:  12/05/19Roma Kayser, NP  Patient presents today for acute visit with complaints of cough with green mucus x2 days. Associated chest tightness and wheezing, which he states is his baseline. He is still using Breo and spiriva inhaler. Hasn't required rescue inhaler. Planning for hip surgery in 5 days on December 10th. He has pre-op appointment today. Afebrile today.  10/06/2018 Patient presents today with acute complains of hemoptysis in the morning x3 days (12/31, 1/1, 1/2). No episodes today. Seen by PCP on 12/27 for viral URI symptoms and nasal congestion. He has been on Xarelto since July 2019. Denies fever, chills, shortness of breath, wheezing.    Allergies  Allergen Reactions  . Amoxicillin Rash and Other (See Comments)    * SEVERE RASH IN GROIN AREA Has patient had a PCN reaction causing immediate rash, facial/tongue/throat swelling, SOB or lightheadedness with hypotension: no Has patient had a PCN reaction causing severe rash involving mucus membranes or skin necrosis: no Has patient had a PCN reaction that required hospitalization: no Has patient had a PCN reaction occurring within the last 10 years: yes If all of the above answers are "NO", then may proceed with Cephalosporin use.   . Augmentin [Amoxicillin-Pot Clavulanate] Rash and Other (See Comments)    * SEVERE RASH IN GROIN AREA  . Azithromycin Rash and Other (See  Comments)    * SEVERE RASH IN GROIN AREA  . Clindamycin/Lincomycin Rash  . Keflex [Cephalexin] Rash    Immunization History  Administered Date(s) Administered  . Influenza Split 07/04/2012  . Influenza, High Dose Seasonal PF 05/28/2016, 06/08/2017, 06/23/2018  . Influenza,inj,Quad PF,6+ Mos 07/05/2013, 05/22/2014, 06/17/2015  . Pneumococcal Conjugate-13 12/01/2015  . Pneumococcal Polysaccharide-23 10/05/2006, 11/30/2016  . Td 10/06/2009  . Zoster 08/21/2013  . Zoster Recombinat (Shingrix) 12/02/2016, 04/02/2017    Past Medical History:  Diagnosis Date  . Allergy   . Anxiety    history of PTSD following CABG  . Ascending aortic aneurysm ([place]) 01/31/2018   43 x 42 mm, pt unaware  . Asthma   . Cardiomegaly 10/17/2017  . Colitis- colonoscopy 2014 07/13/2015  . COPD (chronic obstructive pulmonary disease) ([place])   . Coronary artery disease    x 6  . Depression   . Diabetes mellitus without complication ([place])   . Family history of polyps in the colon   . Finger dislocation    Left pinkie  . GERD (gastroesophageal reflux disease)   . Gout   . H/O atrial fibrillation without current medication    following CABG with no documented episodes since then.  . Heart palpitations   . Hx of adenomatous colonic polyps 08/12/2010  . Hyperlipidemia   . Hypertension   . OA (osteoarthritis)   . OSA (obstructive sleep apnea)    Mild, has not received CPAP yet  . Prediabetes   . RLS (restless legs syndrome)   . 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 Packs/day: 1.00  . Years: 7.00  . Pack years: 7.00  . Types: Cigarettes  . Last attempt to quit: 10/04/1977  . Years since quitting: 41.0  Smokeless Tobacco Never Used  Tobacco Comment   never smoked over 1 pack    Counseling given: Not Answered Comment: never smoked over 1 pack    Outpatient Medications Prior to Visit  Medication Sig  Dispense Refill  . allopurinol (ZYLOPRIM) 300 MG tablet TAKE 1 TABLET BY MOUTH EVERY DAY (Patient taking differently: Take 300 mg by mouth daily. ) 90 tablet 0  . BREO ELLIPTA 100-25 MCG/INH AEPB INHALE 1 PUFF INTO THE LUNGS DAILY (Patient taking differently: Inhale 1 puff into the lungs daily. ) 60 each 5  . Coenzyme Q10 (CO Q-10) 100 MG CAPS Take 100 mg by mouth daily.     . diphenhydrAMINE HCl, Sleep, (ZZZQUIL) 25 MG CAPS Take 25 mg by mouth at bedtime as needed (for sleep).    . docusate sodium (COLACE) 100 MG capsule Take 1 capsule (100 mg total) by mouth 2 (two) times daily. 10 capsule 0  . dofetilide (TIKOSYN) 125 MCG capsule Take 1 capsule (125 mcg total) by mouth every 12 (twelve) hours. 60 capsule 2  . econazole nitrate 1 % cream Apply 1 application topically every other day.     . fenofibrate 160 MG tablet TAKE 1 TABLET(160 MG) BY MOUTH DAILY 90 tablet 0  . ferrous sulfate (FERROUSUL) 325 (65 FE) MG tablet Take 1 tablet (325 mg total) by mouth 3 (three) times daily with meals.  3  . fluticasone (FLONASE) 50 MCG/ACT nasal spray Place 1 spray into both nostrils daily.     Marland Kitchen glucose blood (ONETOUCH VERIO) test strip Test once daily.  Dx E11.9 100 each 3  . hydrochlorothiazide (HYDRODIURIL) 25 MG tablet Take 1 tablet (25 mg total) by mouth daily. 90 tablet 3  . Insulin Glargine (TOUJEO SOLOSTAR) 300 UNIT/ML SOPN Inject 60 Units as directed daily. (Patient taking differently: Inject 60 Units into the skin daily. ) 22.5 pen 0  . JARDIANCE 25 MG TABS tablet TAKE 1 TABLET BY MOUTH DAILY 90 tablet 0  . LORazepam (ATIVAN) 0.5 MG tablet Take 1 tablet (0.5 mg total) by mouth every 8 (eight) hours as needed for anxiety. 90 tablet 5  . losartan (COZAAR) 100 MG tablet Take 1 tablet (100 mg total) by mouth daily. 90 tablet 3  . metFORMIN (GLUCOPHAGE) 1000 MG tablet TAKE 1 TABLET BY MOUTH TWICE DAILY WITH A MEAL (Patient taking differently: Take 1,000 mg by mouth 2 (two) times daily with a meal. ) 180  tablet 0  . methocarbamol (ROBAXIN) 500 MG tablet Take 1 tablet (500 mg total) by mouth every 6 (six) hours as needed for muscle spasms. 40 tablet 0  . metoprolol (LOPRESSOR) 100 MG tablet Take 100 mg by mouth 2 (two) times daily.    . montelukast (SINGULAIR) 10 MG tablet TAKE 1 TABLET BY MOUTH AT BEDTIME (Patient taking differently: Take 10 mg by mouth at bedtime. ) 90 tablet 0  . Olopatadine HCl (PATADAY) 0.2 % SOLN Apply 1 drop to eye as needed. (Patient taking differently: Place 1 drop into both eyes daily as needed (for allergies). ) 2.5 mL 5  . pantoprazole (PROTONIX) 40 MG tablet TAKE 1 TABLET BY MOUTH TWICE DAILY (Patient taking differently: Take 40 mg by  mouth 2 (two) times daily. ) 180 tablet 0  . polyethylene glycol (MIRALAX / GLYCOLAX) packet Take 17 g by mouth 2 (two) times daily. 14 each 0  . potassium chloride SA (K-DUR,KLOR-CON) 20 MEQ tablet Take 2 tablets (40 mEq total) by mouth daily. 60 tablet 12  . pramipexole (MIRAPEX) 0.25 MG tablet TAKE 1 TABLET(0.25 MG) BY MOUTH AT BEDTIME (Patient taking differently: Take 0.25 mg by mouth at bedtime. ) 90 tablet 1  . ranitidine (ZANTAC) 150 MG capsule Take 150 mg by mouth every evening.    . simvastatin (ZOCOR) 80 MG tablet TAKE 1 TABLET BY MOUTH EVERY DAY (Patient taking differently: Take 80 mg by mouth daily. ) 90 tablet 0  . SPIRIVA HANDIHALER 18 MCG inhalation capsule INHALE CONTENTS OF 1 CAPSULE ONCE DAILY USING HANDIHALER (Patient taking differently: Place 18 mcg into inhaler and inhale daily. ) 90 capsule 1  . XARELTO 20 MG TABS tablet TAKE 1 TABLET BY MOUTH DAILY WITH SUPPER 30 tablet 6   No facility-administered medications prior to visit.     Review of Systems  Review of Systems  Constitutional: Negative.   HENT: Positive for congestion.   Respiratory: Positive for cough. Negative for chest tightness, shortness of breath and wheezing.   Cardiovascular: Negative.    Physical Exam  BP 114/68 (BP Location: Right Arm, Cuff  Size: Normal)   Pulse 89   Temp 97.6 F (36.4 C)   Ht 5' 10.5" (1.791 m)   Wt 255 lb (115.7 kg)   SpO2 96%   BMI 36.07 kg/m  Physical Exam Constitutional:      Appearance: Normal appearance.  HENT:     Head: Normocephalic and atraumatic.     Right Ear: Tympanic membrane normal.     Left Ear: Tympanic membrane normal.     Mouth/Throat:     Mouth: Mucous membranes are moist.     Pharynx: Oropharynx is clear.  Eyes:     Extraocular Movements: Extraocular movements intact.     Pupils: Pupils are equal, round, and reactive to light.  Neck:     Musculoskeletal: Normal range of motion and neck supple.  Cardiovascular:     Rate and Rhythm: Normal rate and regular rhythm.  Pulmonary:     Effort: Pulmonary effort is normal. No respiratory distress.     Breath sounds: Normal breath sounds. No wheezing, rhonchi or rales.  Musculoskeletal: Normal range of motion.  Skin:    General: Skin is warm and dry.  Neurological:     General: No focal deficit present.     Mental Status: He is alert and oriented to person, place, and time. Mental status is at baseline.  Psychiatric:        Mood and Affect: Mood normal.        Behavior: Behavior normal.        Thought Content: Thought content normal.        Judgment: Judgment normal.      Lab Results:  CBC    Component Value Date/Time   WBC 9.6 09/13/2018 0354   RBC 3.31 (L) 09/13/2018 0354   HGB 11.3 (L) 09/13/2018 0354   HGB 16.3 03/28/2018 1136   HCT 33.2 (L) 09/13/2018 0354   HCT 45.4 03/28/2018 1136   PLT 181 09/13/2018 0354   PLT 209 03/28/2018 1136   MCV 100.3 (H) 09/13/2018 0354   MCV 96 03/28/2018 1136   MCH 34.1 (H) 09/13/2018 0354   MCHC 34.0 09/13/2018 0354  RDW 12.2 09/13/2018 0354   RDW 14.2 03/28/2018 1136   LYMPHSABS 1.6 03/28/2018 1136   MONOABS 1.4 (H) 12/12/2017 1642   EOSABS 0.1 03/28/2018 1136   BASOSABS 0.0 03/28/2018 1136    BMET    Component Value Date/Time   NA 138 09/13/2018 0354   NA 138  03/28/2018 1136   K 4.1 09/13/2018 0354   CL 102 09/13/2018 0354   CO2 25 09/13/2018 0354   GLUCOSE 208 (H) 09/13/2018 0354   BUN 15 09/13/2018 0354   BUN 16 03/28/2018 1136   CREATININE 0.97 09/13/2018 0354   CALCIUM 8.8 (L) 09/13/2018 0354   GFRNONAA >60 09/13/2018 0354   GFRAA >60 09/13/2018 0354    BNP No results found for: BNP  ProBNP    Component Value Date/Time   PROBNP 241.0 (H) 10/17/2017 1726    Imaging: Dg Pelvis Portable  Result Date: 09/12/2018 CLINICAL DATA:  Hip replacement EXAM: PORTABLE PELVIS 1-2 VIEWS COMPARISON:  09/12/2018 FINDINGS: Bilateral hip replacement in satisfactory position alignment. No acute fracture. No dislocation. IMPRESSION: Satisfactory bilateral hip replacement Electronically Signed   By: Franchot Gallo M.D.   On: 09/12/2018 17:36   Dg C-arm 1-60 Min-no Report  Result Date: 09/12/2018 Fluoroscopy was utilized by the requesting physician.  No radiographic interpretation.   Dg Hip Operative Unilat With Pelvis Right  Result Date: 09/12/2018 CLINICAL DATA:  Hip replacement EXAM: OPERATIVE right HIP (WITH PELVIS IF PERFORMED) 1 VIEWS TECHNIQUE: Fluoroscopic spot image(s) were submitted for interpretation post-operatively. COMPARISON:  None. FINDINGS: Single low resolution intraoperative spot view of the right hip. There is a right hip replacement with normal alignment. Total fluoroscopy time was 14 seconds. IMPRESSION: Intraoperative fluoroscopic assistance provided during right hip surgery Electronically Signed   By: Donavan Foil M.D.   On: 09/12/2018 16:12     Assessment & Plan:   Hemoptysis - 3 episodes of morning hemoptysis  - Plan CXR and CBC with diff today - Hycodan as needed q6hr for cough suppressant     Martyn Ehrich, NP 10/06/2018

## 2018-10-06 NOTE — Patient Instructions (Addendum)
Labs and CXR today re: hemoptysis  Take Hycodan cough syrup as needed every 6 hours for cough - do not drive while taking or combine with other sedating medication

## 2018-10-06 NOTE — Assessment & Plan Note (Addendum)
-   3 episodes of morning hemoptysis  - Plan CXR and CBC with diff today - Hycodan as needed q6hr for cough suppressant

## 2018-10-08 ENCOUNTER — Other Ambulatory Visit: Payer: Self-pay | Admitting: Family Medicine

## 2018-10-09 ENCOUNTER — Encounter: Payer: Self-pay | Admitting: Internal Medicine

## 2018-10-09 ENCOUNTER — Ambulatory Visit: Payer: Medicare Other | Admitting: Internal Medicine

## 2018-10-09 VITALS — BP 124/76 | HR 92 | Ht 70.5 in | Wt 251.0 lb

## 2018-10-09 DIAGNOSIS — I4819 Other persistent atrial fibrillation: Secondary | ICD-10-CM

## 2018-10-09 DIAGNOSIS — G4733 Obstructive sleep apnea (adult) (pediatric): Secondary | ICD-10-CM

## 2018-10-09 DIAGNOSIS — I10 Essential (primary) hypertension: Secondary | ICD-10-CM | POA: Diagnosis not present

## 2018-10-09 NOTE — Patient Instructions (Addendum)
Medication Instructions:  Your physician recommends that you continue on your current medications as directed. Please refer to the Current Medication list given to you today.  Labwork: None ordered.  Testing/Procedures: None ordered.  Follow-Up: Your physician wants you to follow-up in: 3 months with Dr. Rayann Heman.    January 15, 2019 at 11:30 am with Dr. Rayann Heman.   Any Other Special Instructions Will Be Listed Below (If Applicable).  If you need a refill on your cardiac medications before your next appointment, please call your pharmacy.

## 2018-10-09 NOTE — Progress Notes (Signed)
Cardiology Office Note:    Date:  10/10/2018   ID:  [name], DOB 1950-10-26, MRN [id number]  PCP:  Adam Post, MD  Cardiologist:  Adam Standing, MD    Referring MD: Adam Post, MD   Chief Complaint  Patient presents with  . Sleep Apnea    History of Present Illness:    Adam [name] is a 68 y.o. male with a hx of mild OSA with an AHI of 9.9/hr and O2 desaturations as low as 76%.  He underwent CPAP titration to 16cm H2O.  He is here today because he was not ready to get a CPAP device set up when I saw him last and now wants to get set up.   Past Medical History:  Diagnosis Date  . Allergy   . Anxiety    history of PTSD following CABG  . Ascending aortic aneurysm ([name]) 01/31/2018   43 x 42 mm, pt unaware  . Asthma   . Cardiomegaly 10/17/2017  . Colitis- colonoscopy 2014 07/13/2015  . COPD (chronic obstructive pulmonary disease) ([name])   . Coronary artery disease    x 6  . Depression   . Diabetes mellitus without complication ([name])   . Family history of polyps in the colon   . Finger dislocation    Left pinkie  . GERD (gastroesophageal reflux disease)   . Gout   . H/O atrial fibrillation without current medication    following CABG with no documented episodes since then.  . Heart palpitations   . Hx of adenomatous colonic polyps 08/12/2010  . Hyperlipidemia   . Hypertension   . OA (osteoarthritis)   . OSA (obstructive sleep apnea)    Mild, has not received CPAP yet  . Prediabetes   . RLS (restless legs syndrome)   . 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 ANKLE FRACTURE SURGERY Right 1991  . APPENDECTOMY    . ATRIAL FIBRILLATION ABLATION N/A 03/31/2018   Procedure: ATRIAL FIBRILLATION ABLATION;  Surgeon: [name], MD;  Location: [name];  Service: Cardiovascular;  Laterality: N/A;  . COLONOSCOPY W/ BIOPSIES  2017   x7  . CORONARY ANGIOPLASTY WITH STENT  PLACEMENT    . CORONARY ARTERY BYPASS GRAFT    . FINGER SURGERY  04/2018   Small finger left hand  . TEE WITHOUT CARDIOVERSION N/A 03/30/2018   Procedure: TRANSESOPHAGEAL ECHOCARDIOGRAM (TEE);  Surgeon: [name], MD;  Location: [name];  Service: Cardiovascular;  Laterality: N/A;  . TOTAL HIP ARTHROPLASTY Left   . TOTAL HIP ARTHROPLASTY Right 09/12/2018   Procedure: TOTAL HIP ARTHROPLASTY ANTERIOR APPROACH;  Surgeon: [facility] Cancel, MD;  Location: [facility];  Service: Orthopedics;  Laterality: Right;  70 mins    Current Medications: Current Meds  Medication Sig  . allopurinol (ZYLOPRIM) 300 MG tablet TAKE 1 TABLET BY MOUTH EVERY DAY (Patient taking differently: Take 300 mg by mouth daily. )  . BREO ELLIPTA 100-25 MCG/INH AEPB INHALE 1 PUFF INTO THE LUNGS DAILY (Patient taking differently: Inhale 1 puff into the lungs daily. )  . Coenzyme Q10 (CO Q-10) 100 MG CAPS Take 100 mg by mouth daily.   . diphenhydrAMINE HCl, Sleep, (ZZZQUIL) 25 MG CAPS Take 25 mg by mouth at bedtime as needed (for sleep).  . dofetilide (TIKOSYN) 125 MCG capsule Take 1 capsule (125 mcg total) by mouth every 12 (twelve) hours.  Marland Kitchen econazole  nitrate 1 % cream Apply 1 application topically every other day.   . fenofibrate 160 MG tablet TAKE 1 TABLET(160 MG) BY MOUTH DAILY  . fluticasone (FLONASE) 50 MCG/ACT nasal spray Place 1 spray into both nostrils daily.   Marland Kitchen glucose blood (ONETOUCH VERIO) test strip Test once daily.  Dx E11.9  . hydrochlorothiazide (HYDRODIURIL) 25 MG tablet Take 1 tablet (25 mg total) by mouth daily.  . Insulin Glargine (TOUJEO SOLOSTAR) 300 UNIT/ML SOPN Inject 60 Units as directed daily. (Patient taking differently: Inject 60 Units into the skin daily. )  . JARDIANCE 25 MG TABS tablet TAKE 1 TABLET BY MOUTH DAILY  . LORazepam (ATIVAN) 0.5 MG tablet Take 1 tablet (0.5 mg total) by mouth every 8 (eight) hours as needed for anxiety.  Marland Kitchen losartan (COZAAR) 100 MG tablet Take 1 tablet (100 mg  total) by mouth daily.  . metFORMIN (GLUCOPHAGE) 1000 MG tablet TAKE 1 TABLET BY MOUTH TWICE DAILY WITH A MEAL (Patient taking differently: Take 1,000 mg by mouth 2 (two) times daily with a meal. )  . methocarbamol (ROBAXIN) 500 MG tablet Take 1 tablet (500 mg total) by mouth every 6 (six) hours as needed for muscle spasms.  . metoprolol (LOPRESSOR) 100 MG tablet Take 100 mg by mouth 2 (two) times daily.  . montelukast (SINGULAIR) 10 MG tablet TAKE 1 TABLET BY MOUTH AT BEDTIME (Patient taking differently: Take 10 mg by mouth at bedtime. )  . Olopatadine HCl (PATADAY) 0.2 % SOLN Apply 1 drop to eye as needed. (Patient taking differently: Place 1 drop into both eyes daily as needed (for allergies). )  . pantoprazole (PROTONIX) 40 MG tablet TAKE 1 TABLET BY MOUTH TWICE DAILY (Patient taking differently: Take 40 mg by mouth 2 (two) times daily. )  . potassium chloride SA (K-DUR,KLOR-CON) 20 MEQ tablet Take 2 tablets (40 mEq total) by mouth daily.  . pramipexole (MIRAPEX) 0.25 MG tablet TAKE 1 TABLET(0.25 MG) BY MOUTH AT BEDTIME (Patient taking differently: Take 0.25 mg by mouth at bedtime. )  . ranitidine (ZANTAC) 150 MG capsule Take 150 mg by mouth every evening.  . simvastatin (ZOCOR) 80 MG tablet TAKE 1 TABLET BY MOUTH EVERY DAY  . SPIRIVA HANDIHALER 18 MCG inhalation capsule INHALE CONTENTS OF 1 CAPSULE ONCE DAILY USING HANDIHALER (Patient taking differently: Place 18 mcg into inhaler and inhale daily. )  . XARELTO 20 MG TABS tablet TAKE 1 TABLET BY MOUTH DAILY WITH SUPPER     Allergies:   Amoxicillin; Augmentin [amoxicillin-pot clavulanate]; Azithromycin; Clindamycin/lincomycin; and Keflex [cephalexin]   Social History   Socioeconomic History  . Marital status: Married    Spouse name: Not on file  . Number of children: Not on file  . Years of education: Not on file  . Highest education level: Not on file  Occupational History  . Occupation: retired  Scientific laboratory technician  . Financial resource  strain: Not on file  . Food insecurity:    Worry: Not on file    Inability: Not on file  . Transportation needs:    Medical: Not on file    Non-medical: Not on file  Tobacco Use  . Smoking status: Former Smoker    Packs/day: 1.00    Years: 7.00    Pack years: 7.00    Types: Cigarettes    Last attempt to quit: 10/04/1977    Years since quitting: 41.0  . Smokeless tobacco: Never Used  . Tobacco comment: never smoked over 1 pack   Substance  and Sexual Activity  . Alcohol use: Yes    Comment: stopped wine and now drinks 3 to 4 scotches   . Drug use: No    Comment: Smoked Marijuana back in 1970s  . Sexual activity: Yes  Lifestyle  . Physical activity:    Days per week: Not on file    Minutes per session: Not on file  . Stress: Not on file  Relationships  . Social connections:    Talks on phone: Not on file    Gets together: Not on file    Attends religious service: Not on file    Active member of club or organization: Not on file    Attends meetings of clubs or organizations: Not on file    Relationship status: Not on file  Other Topics Concern  . Not on file  Social History Narrative   Married, no children   Textile industry x yrs   52 off early 60's and 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 work - returned to [employer] from [place] 2014     Family History: The patient's family history includes Colon cancer in his mother; Heart disease in his paternal grandfather and paternal grandmother.  ROS:   Please see the history of present illness.    ROS  All other systems reviewed and negative.   EKGs/Labs/Other Studies Reviewed:    The following studies were reviewed today: PAP download  EKG:  EKG is not ordered today.    Recent Labs: 10/17/2017: Pro B Natriuretic peptide (BNP) 241.0 05/01/2018: Magnesium 1.9 06/23/2018: ALT 26 09/13/2018: BUN 15; Creatinine, Ser 0.97; Potassium 4.1; Sodium 138 10/06/2018: Hemoglobin 12.6; Platelets 215.0   Recent Lipid Panel    Component Value Date/Time    CHOL 199 06/23/2018 1118   TRIG 362.0 (H) 06/23/2018 1118   HDL 39.80 06/23/2018 1118   CHOLHDL 5 06/23/2018 1118   VLDL 72.4 (H) 06/23/2018 1118   LDLCALC 49 08/14/2013 1128   LDLDIRECT 124.0 06/23/2018 1118    Physical Exam:    VS:  BP 114/72   Pulse (!) 109   Ht 5' 10.5" (1.791 m)   Wt 256 lb 6.4 oz (116.3 kg)   SpO2 94%   BMI 36.27 kg/m     Wt Readings from Last 3 Encounters:  10/10/18 256 lb 6.4 oz (116.3 kg)  10/09/18 251 lb (113.9 kg)  10/06/18 255 lb (115.7 kg)     GEN:  Well nourished, well developed in no acute distress HEENT: Normal [name]: No JVD; No carotid bruits LYMPHATICS: No lymphadenopathy CARDIAC: RRR, no murmurs, rubs, gallops RESPIRATORY:  Clear to auscultation without rales, wheezing or rhonchi  ABDOMEN: Soft, non-tender, non-distended MUSCULOSKELETAL:  No edema; No deformity  SKIN: Warm and dry NEUROLOGIC:  Alert and oriented x 3 PSYCHIATRIC:  Normal affect   ASSESSMENT:    1. Obstructive sleep apnea syndrome   2. Obesity (BMI 30-39.9)   3. Essential hypertension, benign    PLAN:    In order of problems listed above:  1.  OSA - the patient is ready to start on CPAP which I will order at 16cm H2O and he will followup with me in 10 weeks.  2.  Obesity  - I have encouraged him to get into a routine exercise program and cut back on carbs and portions.   3.  HTN - BP is well controlled on exam today. He will continue on Losartan 100mg  daily, HCTZ 25mg  daily and Lopressor 100mg  BID.    Medication Adjustments/Labs and Tests  Ordered: Current medicines are reviewed at length with the patient today.  Concerns regarding medicines are outlined above.  No orders of the defined types were placed in this encounter.  No orders of the defined types were placed in this encounter.   Signed, Fransico Him, MD  10/10/2018 10:47 AM    [place]

## 2018-10-09 NOTE — Progress Notes (Signed)
Electrophysiology Office Note Date: 10/09/2018  ID:  Adam [name], DOB 1951/05/14, MRN [id number]  PCP: [name], MD Primary Cardiologist: [name] Electrophysiologist: Dr Rayann Heman  CC: Follow up for atrial fibrillation  Adam [name] is a 68 y.o. male seen today for routine electrophysiology followup.  Since last being seen in our clinic, the patient reports doing very well. He has not noted any episodes of atrial fibrillation since his last visit. Of note, he recently had his hip replaced and is recovering nicely.  He reports that he has still not been using a CPAP machine for his OSA. He has an appointment with Dr Radford Pax tomorrow.  He denies chest pain, palpitations, dyspnea, PND, orthopnea, nausea, vomiting, dizziness, syncope, edema, weight gain, or early satiety. +cough, congestion  Past Medical History:  Diagnosis Date  . Allergy   . Anxiety    history of PTSD following CABG  . Ascending aortic aneurysm ([place]) 01/31/2018   43 x 42 mm, pt unaware  . Asthma   . Cardiomegaly 10/17/2017  . Colitis- colonoscopy 2014 07/13/2015  . COPD (chronic obstructive pulmonary disease) ([place])   . Coronary artery disease    x 6  . Depression   . Diabetes mellitus without complication ([place])   . Family history of polyps in the colon   . Finger dislocation    Left pinkie  . GERD (gastroesophageal reflux disease)   . Gout   . H/O atrial fibrillation without current medication    following CABG with no documented episodes since then.  . Heart palpitations   . Hx of adenomatous colonic polyps 08/12/2010  . Hyperlipidemia   . Hypertension   . OA (osteoarthritis)   . OSA (obstructive sleep apnea)    Mild, has not received CPAP yet  . Prediabetes   . RLS (restless legs syndrome)   . 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 ANKLE FRACTURE SURGERY Right 1991  . APPENDECTOMY    . ATRIAL FIBRILLATION ABLATION N/A  03/31/2018   Procedure: ATRIAL FIBRILLATION ABLATION;  Surgeon: [name], MD;  Location: [facility];  Service: Cardiovascular;  Laterality: N/A;  . COLONOSCOPY W/ BIOPSIES  2017   x7  . CORONARY ANGIOPLASTY WITH STENT PLACEMENT    . CORONARY ARTERY BYPASS GRAFT    . FINGER SURGERY  04/2018   Small finger left hand  . TEE WITHOUT CARDIOVERSION N/A 03/30/2018   Procedure: TRANSESOPHAGEAL ECHOCARDIOGRAM (TEE);  Surgeon: [name], MD;  Location: [facility];  Service: Cardiovascular;  Laterality: N/A;  . TOTAL HIP ARTHROPLASTY Left   . TOTAL HIP ARTHROPLASTY Right 09/12/2018   Procedure: TOTAL HIP ARTHROPLASTY ANTERIOR APPROACH;  Surgeon: [name], MD;  Location: [facility];  Service: Orthopedics;  Laterality: Right;  70 mins    Current Outpatient Medications  Medication Sig Dispense Refill  . allopurinol (ZYLOPRIM) 300 MG tablet TAKE 1 TABLET BY MOUTH EVERY DAY (Patient taking differently: Take 300 mg by mouth daily. ) 90 tablet 0  . BREO ELLIPTA 100-25 MCG/INH AEPB INHALE 1 PUFF INTO THE LUNGS DAILY (Patient taking differently: Inhale 1 puff into the lungs daily. ) 60 each 5  . Coenzyme Q10 (CO Q-10) 100 MG CAPS Take 100 mg by mouth daily.     . diphenhydrAMINE HCl, Sleep, (ZZZQUIL) 25 MG CAPS Take 25 mg by mouth at bedtime as needed (for sleep).    . dofetilide (TIKOSYN)  125 MCG capsule Take 1 capsule (125 mcg total) by mouth every 12 (twelve) hours. 60 capsule 2  . econazole nitrate 1 % cream Apply 1 application topically every other day.     . fenofibrate 160 MG tablet TAKE 1 TABLET(160 MG) BY MOUTH DAILY 90 tablet 0  . fluticasone (FLONASE) 50 MCG/ACT nasal spray Place 1 spray into both nostrils daily.     Marland Kitchen glucose blood (ONETOUCH VERIO) test strip Test once daily.  Dx E11.9 100 each 3  . hydrochlorothiazide (HYDRODIURIL) 25 MG tablet Take 1 tablet (25 mg total) by mouth daily. 90 tablet 3  . Insulin Glargine (TOUJEO SOLOSTAR) 300 UNIT/ML SOPN Inject 60 Units as  directed daily. (Patient taking differently: Inject 60 Units into the skin daily. ) 22.5 pen 0  . JARDIANCE 25 MG TABS tablet TAKE 1 TABLET BY MOUTH DAILY 90 tablet 0  . LORazepam (ATIVAN) 0.5 MG tablet Take 1 tablet (0.5 mg total) by mouth every 8 (eight) hours as needed for anxiety. 90 tablet 5  . losartan (COZAAR) 100 MG tablet Take 1 tablet (100 mg total) by mouth daily. 90 tablet 3  . metFORMIN (GLUCOPHAGE) 1000 MG tablet TAKE 1 TABLET BY MOUTH TWICE DAILY WITH A MEAL (Patient taking differently: Take 1,000 mg by mouth 2 (two) times daily with a meal. ) 180 tablet 0  . methocarbamol (ROBAXIN) 500 MG tablet Take 1 tablet (500 mg total) by mouth every 6 (six) hours as needed for muscle spasms. 40 tablet 0  . metoprolol (LOPRESSOR) 100 MG tablet Take 100 mg by mouth 2 (two) times daily.    . montelukast (SINGULAIR) 10 MG tablet TAKE 1 TABLET BY MOUTH AT BEDTIME (Patient taking differently: Take 10 mg by mouth at bedtime. ) 90 tablet 0  . Olopatadine HCl (PATADAY) 0.2 % SOLN Apply 1 drop to eye as needed. (Patient taking differently: Place 1 drop into both eyes daily as needed (for allergies). ) 2.5 mL 5  . pantoprazole (PROTONIX) 40 MG tablet TAKE 1 TABLET BY MOUTH TWICE DAILY (Patient taking differently: Take 40 mg by mouth 2 (two) times daily. ) 180 tablet 0  . potassium chloride SA (K-DUR,KLOR-CON) 20 MEQ tablet Take 2 tablets (40 mEq total) by mouth daily. 60 tablet 12  . pramipexole (MIRAPEX) 0.25 MG tablet TAKE 1 TABLET(0.25 MG) BY MOUTH AT BEDTIME (Patient taking differently: Take 0.25 mg by mouth at bedtime. ) 90 tablet 1  . ranitidine (ZANTAC) 150 MG capsule Take 150 mg by mouth every evening.    . simvastatin (ZOCOR) 80 MG tablet TAKE 1 TABLET BY MOUTH EVERY DAY 90 tablet 0  . SPIRIVA HANDIHALER 18 MCG inhalation capsule INHALE CONTENTS OF 1 CAPSULE ONCE DAILY USING HANDIHALER (Patient taking differently: Place 18 mcg into inhaler and inhale daily. ) 90 capsule 1  . XARELTO 20 MG TABS  tablet TAKE 1 TABLET BY MOUTH DAILY WITH SUPPER 30 tablet 6   No current facility-administered medications for this visit.     Allergies:   Amoxicillin; Augmentin [amoxicillin-pot clavulanate]; Azithromycin; Clindamycin/lincomycin; and Keflex [cephalexin]   Social History: Social History   Socioeconomic History  . Marital status: Married    Spouse name: Not on file  . Number of children: Not on file  . Years of education: Not on file  . Highest education level: Not on file  Occupational History  . Occupation: retired  Scientific laboratory technician  . Financial resource strain: Not on file  . Food insecurity:    Worry:  Not on file    Inability: Not on file  . Transportation needs:    Medical: Not on file    Non-medical: Not on file  Tobacco Use  . Smoking status: Former Smoker    Packs/day: 1.00    Years: 7.00    Pack years: 7.00    Types: Cigarettes    Last attempt to quit: 10/04/1977    Years since quitting: 41.0  . Smokeless tobacco: Never Used  . Tobacco comment: never smoked over 1 pack   Substance and Sexual Activity  . Alcohol use: Yes    Comment: stopped wine and now drinks 3 to 4 scotches   . Drug use: No    Comment: Smoked Marijuana back in 1970s  . Sexual activity: Yes  Lifestyle  . Physical activity:    Days per week: Not on file    Minutes per session: Not on file  . Stress: Not on file  Relationships  . Social connections:    Talks on phone: Not on file    Gets together: Not on file    Attends religious service: Not on file    Active member of club or organization: Not on file    Attends meetings of clubs or organizations: Not on file    Relationship status: Not on file  . Intimate partner violence:    Fear of current or ex partner: Not on file    Emotionally abused: Not on file    Physically abused: Not on file    Forced sexual activity: Not on file  Other Topics Concern  . Not on file  Social History Narrative   Married, no children   Textile industry x yrs    84 off early 60's and 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 work - returned to [employer] from [place] 2014    Family History: Family History  Problem Relation Age of Onset  . Colon cancer Mother   . Heart disease Paternal Grandmother   . Heart disease Paternal Grandfather     Review of Systems: All other systems reviewed and are otherwise negative except as noted above.   Physical Exam: VS:  BP 124/76   Pulse 92   Ht 5' 10.5" (1.791 m)   Wt 251 lb (113.9 kg)   SpO2 96%   BMI 35.51 kg/m  , BMI Body mass index is 35.51 kg/m. Wt Readings from Last 3 Encounters:  10/09/18 251 lb (113.9 kg)  10/06/18 255 lb (115.7 kg)  09/29/18 256 lb 2 oz (116.2 kg)    GEN- The patient is well appearing, alert and oriented x 3 today.   HEENT: normocephalic, atraumatic; sclera clear, conjunctiva pink; hearing intact; oropharynx clear; neck supple, no JVP Lymph- no cervical lymphadenopathy Lungs- Clear to ausculation bilaterally, normal work of breathing.  No wheezes, rales, rhonchi Heart- Regular rate and rhythm, no murmurs, rubs or gallops, PMI not laterally displaced Extremities- no clubbing, cyanosis, or edema; DP/PT/radial pulses 2+ bilaterally MS- no significant deformity or atrophy Skin- warm and dry, no rash or lesion  Psych- euthymic mood, full affect Neuro- strength and sensation are intact   EKG:  EKG is ordered today. The ekg ordered today shows SR HR 92, PR 158, QRS 76, QTc 445  Recent Labs: 10/17/2017: Pro B Natriuretic peptide (BNP) 241.0 05/01/2018: Magnesium 1.9 06/23/2018: ALT 26 09/13/2018: BUN 15; Creatinine, Ser 0.97; Potassium 4.1; Sodium 138 10/06/2018: Hemoglobin 12.6; Platelets 215.0    Assessment and Plan:  1. Persistent atrial fibrillation Continues to do well  s/p ablation. Currently on dofetilide 125 mcg BID, Lopressor 100 mg BID and Xarelto 20 mg daily We discussed the possibility of stopping his AAD since he is doing well after ablation. He would like to continue the current  therapy. Check Bmet/mag next visit QT stable  2. OSA Has not yet started CPAP therapy. Follow up with Dr Radford Pax tomorrow.  3. HTN Stable, no changes today  4. Obesity Body mass index is 35.51 kg/m.  Lifestyle modification encouraged.    Current medicines are reviewed at length with the patient today.   The patient does not have concerns regarding his medicines.  The following changes were made today:  none  Labs/ tests ordered today include:  Orders Placed This Encounter  Procedures  . EKG 12-Lead     Disposition:   Follow up with Afib clinic in 3 months   Signed, [name] MD 10/09/2018 1:10 PM   [address] [name] [address] [phone] (office) [phone] (fax)

## 2018-10-10 ENCOUNTER — Ambulatory Visit: Payer: Medicare Other | Admitting: Cardiology

## 2018-10-10 ENCOUNTER — Encounter: Payer: Self-pay | Admitting: Cardiology

## 2018-10-10 VITALS — BP 114/72 | HR 109 | Ht 70.5 in | Wt 256.4 lb

## 2018-10-10 DIAGNOSIS — E669 Obesity, unspecified: Secondary | ICD-10-CM | POA: Diagnosis not present

## 2018-10-10 DIAGNOSIS — I10 Essential (primary) hypertension: Secondary | ICD-10-CM | POA: Diagnosis not present

## 2018-10-10 DIAGNOSIS — G4733 Obstructive sleep apnea (adult) (pediatric): Secondary | ICD-10-CM

## 2018-10-10 NOTE — Patient Instructions (Signed)
Medication Instructions:  Your physician recommends that you continue on your current medications as directed. Please refer to the Current Medication list given to you today.  If you need a refill on your cardiac medications before your next appointment, please call your pharmacy.   Lab work: None If you have labs (blood work) drawn today and your tests are completely normal, you will receive your results only by: Marland Kitchen MyChart Message (if you have MyChart) OR . A paper copy in the mail If you have any lab test that is abnormal or we need to change your treatment, we will call you to review the results.  Testing/Procedures: None  Follow-Up: 10 weeks after CPAP device is received, call our office when you receive the device and ask for our sleep coordinator.

## 2018-10-17 NOTE — Telephone Encounter (Signed)
Patient is ready to start cpap now:  Upon patient request DME selection is [facility]. Patient understands he will be contacted by [facility] to set up his cpap. Patient understands to call if [facility] does not contact him with new setup in a timely manner. Patient understands they will be called once confirmation has been received from [facility] that they have received their new machine to schedule 10 week follow up appointment.  [facility] notified of new cpap order  Please add to airview Patient was grateful for the call and thanked me.

## 2018-10-30 DIAGNOSIS — Z96641 Presence of right artificial hip joint: Secondary | ICD-10-CM | POA: Diagnosis not present

## 2018-10-30 DIAGNOSIS — Z471 Aftercare following joint replacement surgery: Secondary | ICD-10-CM | POA: Diagnosis not present

## 2018-11-07 ENCOUNTER — Other Ambulatory Visit: Payer: Self-pay | Admitting: Family Medicine

## 2018-11-13 DIAGNOSIS — G4733 Obstructive sleep apnea (adult) (pediatric): Secondary | ICD-10-CM | POA: Diagnosis not present

## 2018-11-14 ENCOUNTER — Telehealth: Payer: Self-pay | Admitting: *Deleted

## 2018-11-14 ENCOUNTER — Other Ambulatory Visit: Payer: Self-pay | Admitting: Family Medicine

## 2018-11-14 NOTE — Telephone Encounter (Signed)
Patient needs to be dismissed from our practice.  Please send this to Crystal to read and get appropriate paper work

## 2018-11-14 NOTE — Telephone Encounter (Signed)
[facility] Ivin Booty and Anderson Malta) called this morning to inform me of our patient's behavior toward them and the staff. Patient was rude, arrogant, and nasty at his setup to everyone. [facility] explained everything to him about his cpap just as they do everyone at setup but the patient still asked them four times how to open the water reservoir. Patient was encouraged to refer to his owner's manual for help with things he can do himself and he "replied it would take the rest of his life to read that manual." Patient wanted [facility] to go over his sleep study again with him stating he does not understand why he needed it and they informed him that they can't advise him from a doctor's point of view but Anderson Malta did go over his AHI and his oxygen desaturation with him again. Per Ivin Booty patient states he absolutely hates Dr Radford Pax and he continued to be rude and obnoxious to the office staff and herself. Ivin Booty says she will have to dismiss him from their services before she will tolerate him abusing her or her office staff.  Ivin Booty made the suggestion to have potentially difficult patients come into the office before getting setup to talk with Dr Radford Pax. Ivin Booty states the patient paid for everything but she doesn't think this patient will do well with his cpap.

## 2018-12-07 ENCOUNTER — Other Ambulatory Visit: Payer: Self-pay | Admitting: Family Medicine

## 2018-12-08 ENCOUNTER — Other Ambulatory Visit: Payer: Self-pay | Admitting: Emergency Medicine

## 2018-12-11 DIAGNOSIS — Z96641 Presence of right artificial hip joint: Secondary | ICD-10-CM | POA: Diagnosis not present

## 2018-12-11 DIAGNOSIS — Z471 Aftercare following joint replacement surgery: Secondary | ICD-10-CM | POA: Diagnosis not present

## 2018-12-12 DIAGNOSIS — G4733 Obstructive sleep apnea (adult) (pediatric): Secondary | ICD-10-CM | POA: Diagnosis not present

## 2018-12-18 ENCOUNTER — Other Ambulatory Visit: Payer: Self-pay | Admitting: Family Medicine

## 2018-12-22 ENCOUNTER — Encounter: Payer: Self-pay | Admitting: Family Medicine

## 2018-12-22 ENCOUNTER — Ambulatory Visit (INDEPENDENT_AMBULATORY_CARE_PROVIDER_SITE_OTHER): Payer: Medicare Other | Admitting: Family Medicine

## 2018-12-22 ENCOUNTER — Other Ambulatory Visit: Payer: Self-pay

## 2018-12-22 VITALS — BP 108/66 | HR 99 | Temp 98.2°F | Ht 70.5 in | Wt 256.9 lb

## 2018-12-22 DIAGNOSIS — D539 Nutritional anemia, unspecified: Secondary | ICD-10-CM | POA: Diagnosis not present

## 2018-12-22 DIAGNOSIS — E119 Type 2 diabetes mellitus without complications: Secondary | ICD-10-CM | POA: Diagnosis not present

## 2018-12-22 DIAGNOSIS — F419 Anxiety disorder, unspecified: Secondary | ICD-10-CM | POA: Diagnosis not present

## 2018-12-22 DIAGNOSIS — I10 Essential (primary) hypertension: Secondary | ICD-10-CM | POA: Diagnosis not present

## 2018-12-22 LAB — POCT GLYCOSYLATED HEMOGLOBIN (HGB A1C): Hemoglobin A1C: 6.7 % — AB (ref 4.0–5.6)

## 2018-12-22 MED ORDER — LORAZEPAM 0.5 MG PO TABS: 0.5000 mg | ORAL_TABLET | Freq: Three times a day (TID) | ORAL | 5 refills | Status: DC | PRN

## 2018-12-22 NOTE — Progress Notes (Signed)
Subjective:     Patient ID: Adam [name], male   DOB: 1950/12/11, 68 y.o.   MRN: [id number]  HPI Patient is seen for medical follow-up.  Since we last saw him he has had right total hip replacement in December and that went well.  He had ablation procedure back in July.  Still has intermittent atrial fibrillation.  Remains on Tikosyn and Xarelto.  No chest pains.  He had recent labs with cardiology with mild macrocytic anemia.  MCV around 100.  He has occasional dysesthesias in the feet.  He does take Protonix and metformin chronically and is on a recent B12 level.  Type 2 diabetes.  History of good control.  No polyuria or polydipsia.  Remains on metformin.  History of chronic anxiety.  He is on lorazepam.  Requesting refills.  Has been on this several years.  We tapered him down.  He was on 1 mg 4 times daily when we first saw him and currently down to 0.5 mg 3 times daily  Past Medical History:  Diagnosis Date  . Allergy   . Anxiety    history of PTSD following CABG  . Ascending aortic aneurysm ([place]) 01/31/2018   43 x 42 mm, pt unaware  . Asthma   . Cardiomegaly 10/17/2017  . Colitis- colonoscopy 2014 07/13/2015  . COPD (chronic obstructive pulmonary disease) ([place])   . Coronary artery disease    x 6  . Depression   . Diabetes mellitus without complication ([place])   . Family history of polyps in the colon   . Finger dislocation    Left pinkie  . GERD (gastroesophageal reflux disease)   . Gout   . H/O atrial fibrillation without current medication    following CABG with no documented episodes since then.  . Heart palpitations   . Hx of adenomatous colonic polyps 08/12/2010  . Hyperlipidemia   . Hypertension   . OA (osteoarthritis)   . OSA (obstructive sleep apnea)    Mild, has not received CPAP yet  . Prediabetes   . RLS (restless legs syndrome)   . 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 ANKLE FRACTURE  SURGERY Right 1991  . APPENDECTOMY    . ATRIAL FIBRILLATION ABLATION N/A 03/31/2018   Procedure: ATRIAL FIBRILLATION ABLATION;  Surgeon: [name], MD;  Location: [facility];  Service: Cardiovascular;  Laterality: N/A;  . COLONOSCOPY W/ BIOPSIES  2017   x7  . CORONARY ANGIOPLASTY WITH STENT PLACEMENT    . CORONARY ARTERY BYPASS GRAFT    . FINGER SURGERY  04/2018   Small finger left hand  . TEE WITHOUT CARDIOVERSION N/A 03/30/2018   Procedure: TRANSESOPHAGEAL ECHOCARDIOGRAM (TEE);  Surgeon: [name], MD;  Location: [facility];  Service: Cardiovascular;  Laterality: N/A;  . TOTAL HIP ARTHROPLASTY Left   . TOTAL HIP ARTHROPLASTY Right 09/12/2018   Procedure: TOTAL HIP ARTHROPLASTY ANTERIOR APPROACH;  Surgeon: [name], MD;  [facility];  Service: Orthopedics;  Laterality: Right;  70 mins    reports that he quit smoking about 41 years ago. His smoking use included cigarettes. He has a 7.00 pack-year smoking history. He has never used smokeless tobacco. He reports current alcohol use. He reports that he does not use drugs. family history includes Colon cancer in his mother; Heart disease in his paternal grandfather and paternal grandmother. Allergies  Allergen Reactions  . Amoxicillin Rash and Other (See  Comments)    * SEVERE RASH IN GROIN AREA Has patient had a PCN reaction causing immediate rash, facial/tongue/throat swelling, SOB or lightheadedness with hypotension: no Has patient had a PCN reaction causing severe rash involving mucus membranes or skin necrosis: no Has patient had a PCN reaction that required hospitalization: no Has patient had a PCN reaction occurring within the last 10 years: yes If all of the above answers are "NO", then may proceed with Cephalosporin use.   . Augmentin [Amoxicillin-Pot Clavulanate] Rash and Other (See Comments)    * SEVERE RASH IN GROIN AREA  . Azithromycin Rash and Other (See Comments)    * SEVERE RASH IN GROIN AREA  .  Clindamycin/Lincomycin Rash  . Keflex [Cephalexin] Rash     Review of Systems  Constitutional: Negative for fatigue.  Eyes: Negative for visual disturbance.  Respiratory: Negative for cough, chest tightness and shortness of breath.   Cardiovascular: Negative for chest pain, palpitations and leg swelling.  Endocrine: Negative for polydipsia and polyuria.  Neurological: Negative for dizziness, syncope, weakness, light-headedness and headaches.       Objective:   Physical Exam Constitutional:      Appearance: He is well-developed.  HENT:     Right Ear: External ear normal.     Left Ear: External ear normal.  Eyes:     Pupils: Pupils are equal, round, and reactive to light.  Neck:     Musculoskeletal: Neck supple.     Thyroid: No thyromegaly.  Cardiovascular:     Rate and Rhythm: Normal rate and regular rhythm.  Pulmonary:     Effort: Pulmonary effort is normal. No respiratory distress.     Breath sounds: Normal breath sounds. No wheezing or rales.  Neurological:     Mental Status: He is alert and oriented to person, place, and time.        Assessment:     #1 type 2 diabetes controlled with A1c 6.7%  #2 mild macrocytic anemia.  MCV only 100 but we recommended considering B12 screen especially with him being on chronic Protonix and metformin and having some recent mild neuropathy symptoms.  Also check TSH  #3 urine urgency.    4 chronic anxiety    Plan:     -Check further labs with B12 and TSH -Refill lorazepam for 6 months -We will plan routine follow-up in 6 months and sooner as needed  [name] MD [facility]

## 2018-12-25 ENCOUNTER — Telehealth: Payer: Self-pay | Admitting: *Deleted

## 2018-12-25 NOTE — Telephone Encounter (Signed)
Mr Reser I tried to call you back to let you know that the doctor wants to speak with in an office visit in about 3 months to discuss other options for treatment. Are you ok with that?

## 2018-12-25 NOTE — Telephone Encounter (Signed)
called to make 3 month appointment lmtcb.

## 2018-12-25 NOTE — Telephone Encounter (Signed)
Per [facility] patient has a Simplus full face mask however, patient states.  It is not a mask problem or an pressure problem he feels the whole thing is not working for you and you just don't want it. Thanks

## 2018-12-25 NOTE — Telephone Encounter (Signed)
-----   Message from Sueanne Margarita, MD sent at 12/25/2018  1:28 PM EDT ----- Set up for OV with me in 3 months to discuss other treatment options  Traci ----- Message ----- From: Freada Bergeron, CMA Sent: 12/25/2018   1:05 PM EDT To: Sueanne Margarita, MD  Per [facility] patient has a Simplus full face mask however, patient states.  It is not a mask problem or an pressure problem he feels the whole thing is not working for you and you just don't want it. Thanks

## 2018-12-28 ENCOUNTER — Encounter: Payer: Self-pay | Admitting: Family Medicine

## 2018-12-29 ENCOUNTER — Other Ambulatory Visit (INDEPENDENT_AMBULATORY_CARE_PROVIDER_SITE_OTHER): Payer: Medicare Other

## 2018-12-29 ENCOUNTER — Other Ambulatory Visit: Payer: Self-pay

## 2018-12-29 DIAGNOSIS — R5383 Other fatigue: Secondary | ICD-10-CM | POA: Diagnosis not present

## 2018-12-29 LAB — VITAMIN B12: Vitamin B-12: 172 pg/mL — ABNORMAL LOW (ref 211–911)

## 2018-12-29 LAB — TSH: TSH: 1.32 u[IU]/mL (ref 0.35–4.50)

## 2019-01-02 ENCOUNTER — Other Ambulatory Visit: Payer: Self-pay | Admitting: Family Medicine

## 2019-01-03 ENCOUNTER — Telehealth: Payer: Self-pay | Admitting: *Deleted

## 2019-01-03 NOTE — Telephone Encounter (Signed)
Hi Adam,  I shall call you then.  Thanks  Adam [name]	[place]  ----- Message -----  From: CMA Adam [name]  Sent: 01/01/2019 3:13 PM EDT  To: Adam [name]  Subject: RE: Non-Urgent Medical Question  [place] call me after June 10th and we will schedule you.  Thanks,   Adam [name], CMA  Sleep Coordinator    ----- Message -----   From: Adam [name]   Sent: 12/29/2018 5:59 PM EDT    To: CMA Adam [name]  Subject: RE: Non-Urgent Medical Question    Adam [name],  Sorry again!!  [place] will [place] from May 13 thru June 10...Marland Kitchenso we kind of need to start from square one again.....Marland Kitchensorry :(  We will speak Monday morning....K??  Adam [name]  Adam  ----- Message -----  From: CMA Adam [name]  Sent: 12/29/2018 5:35 PM EDT  To: Adam [name]  Subject: RE: Non-Urgent Medical Question  Yes, check your voicemail and call me Monday to set a time. Thanks      ----- Message -----   From: Adam [name]   Sent: 12/25/2018 6:25 PM EDT    To: CMA Adam [name]  Subject: RE: Non-Urgent Medical Question    Hi Adam,  Sorry I missed your call....not sure how that happened.Marland KitchenMarland KitchenMarland KitchenI have been [name], probably outside at that moment.  Yes, that is fine. Shall we set up the date now?  Adam  ----- Message -----  From: CMA Adam [name]  Sent: 12/25/2018 6:19 PM EDT  To: Adam [name]  Subject: RE: Non-Urgent Medical Question  Adam [name] I tried to call you back to let you know that the doctor wants to speak with in an office visit in about 3 months to discuss other options for treatment. Are you ok with that?  Adam [name], CMA  Sleep Coordinator        ----- Message -----   From: Adam [name]   Sent: 12/25/2018 1:29 PM EDT    To: CMA Adam [name]  Subject: RE: Non-Urgent Medical Question    OK, Thank you Gae Bon....will do.  Adam Adam [name]  ----- Message -----  From: CMA Adam [name]  Sent: 12/25/2018 1:07 PM EDT  To: Adam [name]  Subject: RE: Non-Urgent Medical  Question  Hello Adam Hing I have notified [name] that you would be turning in your cpap unit. We understand it is not a mask problem or an pressure problem you feel the whole thing is not working for you and you just don't want it.  Thank you.  Adam [name], CMA  Sleep CoordinatorNina Ronnald [name], Adam [name]  Sleep Coordinator    ----- Message -----   From: Adam [name]   Sent: 12/22/2018 11:06 AM EDT    To: Fransico Him, MD  Subject: Non-Urgent Medical Question    Hi Dr. Radford Pax,  I tried for four days and just could not deal with the CPAP mask. I still have the equipment and do not know what to do with it?  Please advise when convenient...Marland KitchenMarland Kitchenthank you.  Adam [name]	[place]

## 2019-01-08 ENCOUNTER — Other Ambulatory Visit: Payer: Self-pay | Admitting: Cardiology

## 2019-01-08 MED ORDER — POTASSIUM CHLORIDE CRYS ER 20 MEQ PO TBCR: 40.0000 meq | EXTENDED_RELEASE_TABLET | Freq: Every day | ORAL | 8 refills | Status: DC

## 2019-01-08 NOTE — Telephone Encounter (Signed)
°*  STAT* If patient is at the pharmacy, call can be transferred to refill team.   1. Which medications need to be refilled? (please list name of each medication and dose if known) Potassium CL 200MEQ 2. Which pharmacy/location (including street and city if local pharmacy) is medication to be sent to? [facility] [address]  3. Do they need a 30 day or 90 day supply? 90 day

## 2019-01-08 NOTE — Addendum Note (Signed)
Addended by: Dollene Primrose on: 01/08/2019 05:14 PM   Modules accepted: Orders

## 2019-01-08 NOTE — Telephone Encounter (Signed)
This pt is a former pt of [name], now seeing Dr Rayann Heman. 35mEq K+ was filled under Dr Rayann Heman as Dr Radford Pax only manages pts OSA.

## 2019-01-10 ENCOUNTER — Other Ambulatory Visit: Payer: Self-pay | Admitting: Emergency Medicine

## 2019-01-10 ENCOUNTER — Other Ambulatory Visit: Payer: Self-pay | Admitting: Family Medicine

## 2019-01-15 ENCOUNTER — Ambulatory Visit: Payer: Medicare Other | Admitting: Internal Medicine

## 2019-02-01 ENCOUNTER — Other Ambulatory Visit: Payer: Self-pay | Admitting: Family Medicine

## 2019-02-02 NOTE — Telephone Encounter (Signed)
This needs to be refilled by Cardiology.

## 2019-02-02 NOTE — Telephone Encounter (Signed)
Last OV 12/22/18, Next OV 06/25/19  Last filled 12/18/17, # 60 with 2 refills by Dr. [name], MD  OK to fill?

## 2019-02-05 ENCOUNTER — Other Ambulatory Visit: Payer: Self-pay

## 2019-02-06 ENCOUNTER — Telehealth (HOSPITAL_COMMUNITY): Payer: Self-pay | Admitting: *Deleted

## 2019-02-06 ENCOUNTER — Encounter (HOSPITAL_COMMUNITY): Payer: Self-pay | Admitting: Nurse Practitioner

## 2019-02-06 ENCOUNTER — Other Ambulatory Visit: Payer: Self-pay

## 2019-02-06 ENCOUNTER — Ambulatory Visit (HOSPITAL_COMMUNITY)
Admission: RE | Admit: 2019-02-06 | Discharge: 2019-02-06 | Disposition: A | Discharge status: Home / Self Care | Payer: Medicare Other | Source: Ambulatory Visit | Attending: Nurse Practitioner | Admitting: Nurse Practitioner

## 2019-02-06 ENCOUNTER — Telehealth: Payer: Self-pay

## 2019-02-06 DIAGNOSIS — I4819 Other persistent atrial fibrillation: Secondary | ICD-10-CM

## 2019-02-06 NOTE — Progress Notes (Signed)
Electrophysiology TeleHealth Note   Due to national recommendations of social distancing due to [place] 19, Audio/video telehealth visit is felt to be most appropriate for this patient at this time.  See MyChart message/consent below from today for patient consent regarding telehealth for the [facility].    Date:  02/06/2019   ID:  Adam [name], DOB [birth year], MRN [id number]  Location: home Provider location: [address] [place], [place] [address] Evaluation Performed: Follow up return of afib  PCP:  [name], MD  Primary Cardiologist:   Previous Dr. Wynonia Lawman Primary Electrophysiologist: Dr. Rayann Heman  CC: afib   History of Present Illness: Adam [name] is a 68 y.o. male who presents via audio conferencing for a telehealth visit today.     He reports that he feels he has been in afib for the last 2 weeks. He states that the only thing that has changed form his usual is that his daily insulin dose was increased. He does have untreated sleep apnea, couldn't tolerate the mask, and does drink 1 1/2 glasses of wine at lunch and 3 scotches at night. He says that his alcohol intake has remained unchanged. Hehad an ablation last year in June and has done well staying in [place] until now. He will occasionally take an extra 1/2 tab metoprolol if he feels his HR is too high. He does not have a BP machine or any way to trace his HR at home. He continues on 125 mcg dofetilide bid.   Today, he denies symptoms of palpitations, chest pain, shortness of breath, orthopnea, PND, lower extremity edema, claudication, dizziness, presyncope, syncope, bleeding, or neurologic sequela. The patient is tolerating medications without difficulties and is otherwise without complaint today.   he denies symptoms of cough, fevers, chills, or new SOB worrisome for COVID 19.     Atrial Fibrillation Risk Factors:  he does have symptoms or diagnosis of sleep apnea. he is not compliant with  CPAP therapy. he does not have a history of rheumatic fever. he does have a history of alcohol use. The patient does not have a history of early familial atrial fibrillation or other arrhythmias.  he has a BMI of Body mass index is 35.65 kg/m.Marland Kitchen Filed Weights   02/06/19 1440  Weight: 114.3 kg    Past Medical History:  Diagnosis Date  . Allergy   . Anxiety    history of PTSD following CABG  . Ascending aortic aneurysm ([place]) 01/31/2018   43 x 42 mm, pt unaware  . Asthma   . Cardiomegaly 10/17/2017  . Colitis- colonoscopy 2014 07/13/2015  . COPD (chronic obstructive pulmonary disease) ([place])   . Coronary artery disease    x 6  . Depression   . Diabetes mellitus without complication ([place])   . Family history of polyps in the colon   . Finger dislocation    Left pinkie  . GERD (gastroesophageal reflux disease)   . Gout   . H/O atrial fibrillation without current medication    following CABG with no documented episodes since then.  . Heart palpitations   . Hx of adenomatous colonic polyps 08/12/2010  . Hyperlipidemia   . Hypertension   . OA (osteoarthritis)   . OSA (obstructive sleep apnea)    Mild, has not received CPAP yet  . Prediabetes   . RLS (restless legs syndrome)   . 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 ANKLE FRACTURE SURGERY Right 1991  . APPENDECTOMY    . ATRIAL FIBRILLATION ABLATION N/A 03/31/2018   Procedure: ATRIAL FIBRILLATION ABLATION;  Surgeon: [name], MD;  Location: [facility];  Service: Cardiovascular;  Laterality: N/A;  . COLONOSCOPY W/ BIOPSIES  2017   x7  . CORONARY ANGIOPLASTY WITH STENT PLACEMENT    . CORONARY ARTERY BYPASS GRAFT    . FINGER SURGERY  04/2018   Small finger left hand  . TEE WITHOUT CARDIOVERSION N/A 03/30/2018   Procedure: TRANSESOPHAGEAL ECHOCARDIOGRAM (TEE);  Surgeon: [name], MD;  Location: [facility];  Service: Cardiovascular;  Laterality:  N/A;  . TOTAL HIP ARTHROPLASTY Left   . TOTAL HIP ARTHROPLASTY Right 09/12/2018   Procedure: TOTAL HIP ARTHROPLASTY ANTERIOR APPROACH;  Surgeon: [name], MD;  Location: [facility];  Service: Orthopedics;  Laterality: Right;  70 mins     Current Outpatient Medications  Medication Sig Dispense Refill  . allopurinol (ZYLOPRIM) 300 MG tablet TAKE 1 TABLET BY MOUTH EVERY DAY 90 tablet 1  . BREO ELLIPTA 100-25 MCG/INH AEPB INHALE 1 PUFF INTO THE LUNGS DAILY 60 each 2  . Coenzyme Q10 (CO Q-10) 100 MG CAPS Take 100 mg by mouth daily.     . diphenhydrAMINE HCl, Sleep, (ZZZQUIL) 25 MG CAPS Take 25 mg by mouth at bedtime as needed (for sleep).    . dofetilide (TIKOSYN) 125 MCG capsule Take 1 capsule (125 mcg total) by mouth every 12 (twelve) hours. 60 capsule 2  . fenofibrate 160 MG tablet TAKE 1 TABLET(160 MG) BY MOUTH DAILY 90 tablet 1  . fluticasone (FLONASE) 50 MCG/ACT nasal spray Place 1 spray into both nostrils daily.     Marland Kitchen gabapentin (NEURONTIN) 300 MG capsule gabapentin 300 mg capsule  TAKE 1 CAPSULE BY MOUTH TWICE DAILY    . hydrochlorothiazide (HYDRODIURIL) 25 MG tablet Take 1 tablet (25 mg total) by mouth daily. 90 tablet 3  . JARDIANCE 25 MG TABS tablet TAKE 1 TABLET BY MOUTH DAILY 90 tablet 0  . LORazepam (ATIVAN) 0.5 MG tablet Take 1 tablet (0.5 mg total) by mouth every 8 (eight) hours as needed for anxiety. 90 tablet 5  . losartan (COZAAR) 100 MG tablet Take 1 tablet (100 mg total) by mouth daily. 90 tablet 3  . metFORMIN (GLUCOPHAGE) 1000 MG tablet TAKE 1 TABLET BY MOUTH TWICE DAILY WITH A MEAL 180 tablet 1  . metoprolol (LOPRESSOR) 100 MG tablet Take 100 mg by mouth 2 (two) times daily.    . montelukast (SINGULAIR) 10 MG tablet TAKE 1 TABLET BY MOUTH AT BEDTIME 90 tablet 1  . Olopatadine HCl (PATADAY) 0.2 % SOLN Apply 1 drop to eye as needed. (Patient taking differently: Place 1 drop into both eyes daily as needed (for allergies). ) 2.5 mL 5  . pantoprazole (PROTONIX) 40 MG tablet  TAKE 1 TABLET BY MOUTH TWICE DAILY (Patient taking differently: Take 40 mg by mouth 2 (two) times daily. ) 180 tablet 0  . potassium chloride SA (K-DUR,KLOR-CON) 20 MEQ tablet Take 2 tablets (40 mEq total) by mouth daily. 60 tablet 8  . pramipexole (MIRAPEX) 0.25 MG tablet TAKE 1 TABLET(0.25 MG) BY MOUTH AT BEDTIME 90 tablet 1  . simvastatin (ZOCOR) 80 MG tablet TAKE 1 TABLET BY MOUTH EVERY DAY 90 tablet 0  . SPIRIVA HANDIHALER 18 MCG inhalation capsule INHALE CONTENTS OF 1 CAPSULE ONCE DAILY USING HANDIHALER 90 capsule 1  . TOUJEO SOLOSTAR 300 UNIT/ML SOPN INJECT 60 UNITS  AS DIRECTED DAILY (Patient taking differently: 80 Units. ) 33 mL 1  . XARELTO 20 MG TABS tablet TAKE 1 TABLET BY MOUTH DAILY WITH SUPPER 30 tablet 6  . econazole nitrate 1 % cream Apply 1 application topically every other day.     Marland Kitchen glucose blood (ONETOUCH VERIO) test strip Test once daily.  Dx E11.9 100 each 3   No current facility-administered medications for this encounter.     Allergies:   Amoxicillin; Augmentin [amoxicillin-pot clavulanate]; Azithromycin; Clindamycin/lincomycin; and Keflex [cephalexin]   Social History:  The patient  reports that he quit smoking about 41 years ago. His smoking use included cigarettes. He has a 7.00 pack-year smoking history. He has never used smokeless tobacco. He reports current alcohol use. He reports that he does not use drugs.   Family History:  The patient's  family history includes Colon cancer in his mother; Heart disease in his paternal grandfather and paternal grandmother.    ROS:  Please see the history of present illness.   All other systems are personally reviewed and negative.   Exam: NA telephone visit  Recent Labs: 05/01/2018: Magnesium 1.9 06/23/2018: ALT 26 09/13/2018: BUN 15; Creatinine, Ser 0.97; Potassium 4.1; Sodium 138 10/06/2018: Hemoglobin 12.6; Platelets 215.0 12/29/2018: TSH 1.32  personally reviewed    Other studies personally reviewed: Epic records  reviewed    ASSESSMENT AND PLAN:  1.  Persistent  atrial fibrillation Pt states for the last 2 weeks that he has felt that his is in afib. No significant change in his health. He states that he has not missed any doses of xarelto for the last 3 weeks and is being compliant with tikosyn Continues  with 100 mg bid of metoprolol We discussed that alcohol use and treated sleep apnea will undermine ability to keep in SR long term.  I discussed with pt possibility of cardioversion or repeat ablation and he is willing to do eith one depending on what Dr. Rayann Heman would prefer.  After I hung up with pt , Dr. Rayann Heman answered me back and said he would be OK with either approach When I can get back up with pt will further discuss pt preference and then put plan in place.  It may make sense to try DCCV and if he has ERAF then proceed to repeatt ablation.     This patients CHA2DS2-VASc Score and unadjusted Ischemic Stroke Rate (% per year) is equal to 4.8 % stroke rate/year from a score of 4  Above score calculated as 1 point each if present [CHF, HTN, DM, Vascular=MI/PAD/Aortic Plaque, Age if 65-74, or Male] Above score calculated as 2 points each if present [Age > 75, or Stroke/TIA/TE]    COVID screen The patient does not have any symptoms that suggest any further testing/ screening at this time.  Social distancing reinforced today.  Follow-up:   As above  Current medicines are reviewed at length with the patient today.   The patient does not have concerns regarding his medicines.  The following changes were made today:  none  Labs/ tests ordered today include: none No orders of the defined types were placed in this encounter.   Patient Risk:  after full review of this patients clinical status, I feel that they are at high risk at this time.   Today, I have spent 15 minutes with the patient with telehealth technology discussing afib/treatment   Signed, Roderic Palau NP 02/06/2019  3:12 PM  [facility] [address]  [place], [place] [address] [phone]   I hereby voluntarily request, consent and authorize the [facility] and its employed or contracted physicians, physician assistants, nurse practitioners or other licensed health care professionals (the Practitioner), to provide me with telemedicine health care services (the "Services") as deemed necessary by the treating Practitioner. I acknowledge and consent to receive the Services by the Practitioner via telemedicine. I understand that the telemedicine visit will involve communicating with the Practitioner through live audiovisual communication technology and the disclosure of certain medical information by electronic transmission. I acknowledge that I have been given the opportunity to request an in-person assessment or other available alternative prior to the telemedicine visit and am voluntarily participating in the telemedicine visit.   I understand that I have the right to withhold or withdraw my consent to the use of telemedicine in the course of my care at any time, without affecting my right to future care or treatment, and that the Practitioner or I may terminate the telemedicine visit at any time. I understand that I have the right to inspect all information obtained and/or recorded in the course of the telemedicine visit and may receive copies of available information for a reasonable fee.  I understand that some of the potential risks of receiving the Services via telemedicine include:   Delay or interruption in medical evaluation due to technological equipment failure or disruption;  Information transmitted may not be sufficient (e.g. poor resolution of images) to allow for appropriate medical decision making by the Practitioner; and/or  In rare instances, security protocols could fail, causing a breach of personal health information.   Furthermore, I acknowledge that it  is my responsibility to provide information about my medical history, conditions and care that is complete and accurate to the best of my ability. I acknowledge that Practitioner's advice, recommendations, and/or decision may be based on factors not within their control, such as incomplete or inaccurate data provided by me or distortions of diagnostic images or specimens that may result from electronic transmissions. I understand that the practice of medicine is not an exact science and that Practitioner makes no warranties or guarantees regarding treatment outcomes. I acknowledge that I will receive a copy of this consent concurrently upon execution via email to the email address I last provided but may also request a printed copy by calling the office of the [facility].  I understand that my insurance will be billed for this visit.   I have read or had this consent read to me.  I understand the contents of this consent, which adequately explains the benefits and risks of the Services being provided via telemedicine.  I have been provided ample opportunity to ask questions regarding this consent and the Services and have had my questions answered to my satisfaction.  I give my informed consent for the services to be provided through the use of telemedicine in my medical care  By participating in this telemedicine visit I agree to the above.

## 2019-02-06 NOTE — Telephone Encounter (Signed)
Lft msg on vcml for [facility].  Pt referred to afib clinic for virtual visit.

## 2019-02-06 NOTE — Telephone Encounter (Signed)
Left the patient a message trying to get him an appointment with Lyda Jester, PA either Thursday 5/7 or next week.

## 2019-02-06 NOTE — Telephone Encounter (Signed)
Left vcml for pt to [facility] to get scheduled

## 2019-02-08 ENCOUNTER — Telehealth (INDEPENDENT_AMBULATORY_CARE_PROVIDER_SITE_OTHER): Payer: Medicare Other | Admitting: Cardiology

## 2019-02-08 ENCOUNTER — Other Ambulatory Visit: Payer: Self-pay

## 2019-02-08 ENCOUNTER — Encounter: Payer: Self-pay | Admitting: Cardiology

## 2019-02-08 VITALS — Ht 70.5 in | Wt 252.0 lb

## 2019-02-08 DIAGNOSIS — I712 Thoracic aortic aneurysm, without rupture, unspecified: Secondary | ICD-10-CM

## 2019-02-08 DIAGNOSIS — Z01812 Encounter for preprocedural laboratory examination: Secondary | ICD-10-CM

## 2019-02-08 DIAGNOSIS — I480 Paroxysmal atrial fibrillation: Secondary | ICD-10-CM

## 2019-02-08 DIAGNOSIS — E785 Hyperlipidemia, unspecified: Secondary | ICD-10-CM

## 2019-02-08 DIAGNOSIS — I2510 Atherosclerotic heart disease of native coronary artery without angina pectoris: Secondary | ICD-10-CM

## 2019-02-08 NOTE — Progress Notes (Signed)
Virtual Visit via Telephone Note   This visit type was conducted due to national recommendations for restrictions regarding the COVID-19 Pandemic (e.g. social distancing) in an effort to limit this patient's exposure and mitigate transmission in our community.  Due to his co-morbid illnesses, this patient is at least at moderate risk for complications without adequate follow up.  This format is felt to be most appropriate for this patient at this time.  The patient did not have access to video technology/had technical difficulties with video requiring transitioning to audio format only (telephone).  All issues noted in this document were discussed and addressed.  No physical exam could be performed with this format.  Please refer to the patient's chart for his  consent to telehealth for [facility].   Date:  02/08/2019   ID:  Adam [name], DOB [birth year], MRN [id number]  Patient Location: Home Provider Location: Home  PCP:  Adam Post, MD  Cardiologist:  Adam Standing, MD  Electrophysiologist:  Adam Grayer, MD   Evaluation Performed:  Follow-Up Visit  Chief Complaint:  Abnormal Chest CT, Ascending Aortic Aneurysm   History of Present Illness:    Adam [name] is a 68 y.o. male scheduled for f/u for thoracic aortic aneurysm.   His primary cardiologist is Dr. Wynonia [name] and [facility] has been seeing him for his primary cardiac needs, over the past year, in Dr. Thurman [name] absence.  He is followed by Adam [name] in the EP clinic.  He has a past medical history which includes CABG in 2011 done in [place], HTN, HLD, DM, Atrial fibrillation, and OSA. He underwent recent catheter ablation for atrial fibrillation by Adam [name] 03/31/18. He is on Tikosyn and Xarelto.  He is followed by Dr. Radford Pax in sleep clinic, but unable to tolerate CPAP. He had an echo in January of last year that showed normal LVEF and no significant valvular abnormalities. Stress test in 2013 showed no ischemia.     I saw him August 08, 2018 for preoperative cardiovascular examination for anticipated right total hip replacement for severe hip OA.  At that time, he denied any anginal symptomatology. Able to complete greater than 4 METS of physical activity without any exertional chest pain or dyspnea.  Patient was cleared for hip replacement surgery from cardiac standpoint. He had hip surgery on September 12, 2018 with Dr. Alvan Dame and recovered well w/o any cardiac issues.  He recently reached out to our office given concerns regarding an abnormal chest CT done 01/2018 that showed an ascending aortic aneurysm, measuring 43 x 42 mm. He has concerns about diagnosis and management. He has not had a f/u scan since diagnosis. He had a 2D echo 10/2017 that showed normal aortic root size and no aortic valve disease. EF was normal and no other significant valvular disease.   Today in f/u, he denies CP, back pain and dyspnea. No tobacco use. He is a former smoker but quit 40 years ago. He does not engage in heavy weight lifting. He is on  blocker therapy w/ metoprolol and reports full compliance. Unfortunately, he does not have a home BP cuff available to check BP. He has had issues with recurrent palpitations, felt to be recurrent atrial fibrillation. He had an Evisit with the Atrial Fibrillation Clinic yesterday and plan is to bring in for office visit next week for repeat EKG and to decide on possible DCCV vs repeat ablation. He reports full compliance w/ Xarelto. Feels ok today.  The patient does not have symptoms concerning for COVID-19 infection (fever, chills, cough, or new shortness of breath).    Past Medical History:  Diagnosis Date  . Allergy   . Anxiety    history of PTSD following CABG  . Ascending aortic aneurysm ([place]) 01/31/2018   43 x 42 mm, pt unaware  . Asthma   . Cardiomegaly 10/17/2017  . Colitis- colonoscopy 2014 07/13/2015  . COPD (chronic obstructive pulmonary disease) ([place])   . Coronary artery  disease    x 6  . Depression   . Diabetes mellitus without complication ([place])   . Family history of polyps in the colon   . Finger dislocation    Left pinkie  . GERD (gastroesophageal reflux disease)   . Gout   . H/O atrial fibrillation without current medication    following CABG with no documented episodes since then.  . Heart palpitations   . Hx of adenomatous colonic polyps 08/12/2010  . Hyperlipidemia   . Hypertension   . OA (osteoarthritis)   . OSA (obstructive sleep apnea)    Mild, has not received CPAP yet  . Prediabetes   . RLS (restless legs syndrome)   . 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 ANKLE FRACTURE SURGERY Right 1991  . APPENDECTOMY    . ATRIAL FIBRILLATION ABLATION N/A 03/31/2018   Procedure: ATRIAL FIBRILLATION ABLATION;  Surgeon: Adam Grayer, MD;  Location: [facility];  Service: Cardiovascular;  Laterality: N/A;  . COLONOSCOPY W/ BIOPSIES  2017   x7  . CORONARY ANGIOPLASTY WITH STENT PLACEMENT    . CORONARY ARTERY BYPASS GRAFT    . FINGER SURGERY  04/2018   Small finger left hand  . TEE WITHOUT CARDIOVERSION N/A 03/30/2018   Procedure: TRANSESOPHAGEAL ECHOCARDIOGRAM (TEE);  Surgeon: [name], MD;  Location: [facility];  Service: Cardiovascular;  Laterality: N/A;  . TOTAL HIP ARTHROPLASTY Left   . TOTAL HIP ARTHROPLASTY Right 09/12/2018   Procedure: TOTAL HIP ARTHROPLASTY ANTERIOR APPROACH;  Surgeon: [name], MD;  Location: [facility];  Service: Orthopedics;  Laterality: Right;  70 mins     Current Meds  Medication Sig  . allopurinol (ZYLOPRIM) 300 MG tablet TAKE 1 TABLET BY MOUTH EVERY DAY  . BREO ELLIPTA 100-25 MCG/INH AEPB INHALE 1 PUFF INTO THE LUNGS DAILY  . Coenzyme Q10 (CO Q-10) 100 MG CAPS Take 100 mg by mouth daily.   . diphenhydrAMINE HCl, Sleep, (ZZZQUIL) 25 MG CAPS Take 25 mg by mouth at bedtime as needed (for sleep).  . dofetilide (TIKOSYN) 125 MCG capsule  Take 1 capsule (125 mcg total) by mouth every 12 (twelve) hours.  Marland Kitchen econazole nitrate 1 % cream Apply 1 application topically every other day.   . fenofibrate 160 MG tablet TAKE 1 TABLET(160 MG) BY MOUTH DAILY  . fluticasone (FLONASE) 50 MCG/ACT nasal spray Place 1 spray into both nostrils daily.   Marland Kitchen gabapentin (NEURONTIN) 300 MG capsule gabapentin 300 mg capsule  TAKE 1 CAPSULE BY MOUTH TWICE DAILY  . glucose blood (ONETOUCH VERIO) test strip Test once daily.  Dx E11.9  . hydrochlorothiazide (HYDRODIURIL) 25 MG tablet Take 1 tablet (25 mg total) by mouth daily.  Marland Kitchen JARDIANCE 25 MG TABS tablet TAKE 1 TABLET BY MOUTH DAILY  . LORazepam (ATIVAN) 0.5 MG tablet Take 1 tablet (0.5 mg total) by mouth every 8 (eight) hours as needed for anxiety.  Marland Kitchen losartan (COZAAR) 100  MG tablet Take 1 tablet (100 mg total) by mouth daily.  . metFORMIN (GLUCOPHAGE) 1000 MG tablet TAKE 1 TABLET BY MOUTH TWICE DAILY WITH A MEAL  . metoprolol (LOPRESSOR) 100 MG tablet Take 100 mg by mouth 2 (two) times daily.  . montelukast (SINGULAIR) 10 MG tablet TAKE 1 TABLET BY MOUTH AT BEDTIME  . Olopatadine HCl 0.2 % SOLN Apply 1 drop to eye daily as needed. Apply to both  eyes  . pantoprazole (PROTONIX) 40 MG tablet TAKE 1 TABLET BY MOUTH TWICE DAILY  . potassium chloride SA (K-DUR,KLOR-CON) 20 MEQ tablet Take 2 tablets (40 mEq total) by mouth daily.  . pramipexole (MIRAPEX) 0.25 MG tablet TAKE 1 TABLET(0.25 MG) BY MOUTH AT BEDTIME  . simvastatin (ZOCOR) 80 MG tablet TAKE 1 TABLET BY MOUTH EVERY DAY  . SPIRIVA HANDIHALER 18 MCG inhalation capsule INHALE CONTENTS OF 1 CAPSULE ONCE DAILY USING HANDIHALER  . TOUJEO SOLOSTAR 300 UNIT/ML SOPN INJECT 60 UNITS AS DIRECTED DAILY  . XARELTO 20 MG TABS tablet TAKE 1 TABLET BY MOUTH DAILY WITH SUPPER     Allergies:   Amoxicillin; Augmentin [amoxicillin-pot clavulanate]; Azithromycin; Clindamycin/lincomycin; and Keflex [cephalexin]   Social History   Tobacco Use  . Smoking status:  Former Smoker    Packs/day: 1.00    Years: 7.00    Pack years: 7.00    Types: Cigarettes    Last attempt to quit: 10/04/1977    Years since quitting: 41.3  . Smokeless tobacco: Never Used  . Tobacco comment: never smoked over 1 pack   Substance Use Topics  . Alcohol use: Yes    Comment: stopped wine and now drinks 3 to 4 scotches   . Drug use: No    Comment: Smoked Marijuana back in 27s     Family Hx: The patient's family history includes Colon cancer in his mother; Heart disease in his paternal grandfather and paternal grandmother.  ROS:   Please see the history of present illness.     All other systems reviewed and are negative.   Prior CV studies:   The following studies were reviewed today:  Chest CT 01/2018 IMPRESSION: 1. There is normal pulmonary vein drainage into the left atrium.  2. The left atrial appendage is large - mixed chicken wing / broccoli type with two lobes. There is a filling defect measuring 33 x 16 mm in the superior lobe suspicious for a thrombus.  3. The esophagus runs to the left from the left atrial midline and is in the proximity to the LLPV.  4. Ascending aortic aneurysm with maximum diameter at the visualized portion 43 x 42 mm. No dissection or calcifications.  2D Echo 10/2017  Left ventricle:  The cavity size was normal. There was mild concentric hypertrophy. Systolic function was normal. The estimated ejection fraction was in the range of 60% to 65%. Wall motion was normal; there were no regional wall motion abnormalities. Normal sinus rhythm was absent. The study was not technically sufficient to allow evaluation of LV diastolic dysfunction due to atrial fibrillation.  ------------------------------------------------------------------- Aortic valve:  Poorly visualized.  Trileaflet; moderately thickened, moderately calcified leaflets. Mobility was not restricted.  Doppler:  Transvalvular velocity was within the normal range.  There was no stenosis. There was no regurgitation.  ------------------------------------------------------------------- Aorta:  Aortic root: The aortic root was normal in size.  ------------------------------------------------------------------- Mitral valve:   Calcified annulus. Mobility was not restricted. Doppler:  Transvalvular velocity was within the normal range. There was no evidence for  stenosis. There was trivial regurgitation. Peak gradient (D): 4 mm Hg.  ------------------------------------------------------------------- Left atrium:  The atrium was normal in size.  ------------------------------------------------------------------- Right ventricle:  The cavity size was normal. Wall thickness was normal. Systolic function was normal.  ------------------------------------------------------------------- Pulmonic valve:    Doppler:  Transvalvular velocity was within the normal range. There was no evidence for stenosis.  ------------------------------------------------------------------- Tricuspid valve:   Structurally normal valve.    Doppler: Transvalvular velocity was within the normal range. There was no regurgitation.  ------------------------------------------------------------------- Pulmonary artery:   The main pulmonary artery was normal-sized. Systolic pressure could not be accurately estimated.  ------------------------------------------------------------------- Right atrium:  The atrium was normal in size.  ------------------------------------------------------------------- Pericardium:  There was no pericardial effusion.   Labs/Other Tests and Data Reviewed:    EKG:  An ECG dated 11/2018 was personally reviewed today and demonstrated:  NSR  Recent Labs: 05/01/2018: Magnesium 1.9 06/23/2018: ALT 26 09/13/2018: BUN 15; Creatinine, Ser 0.97; Potassium 4.1; Sodium 138 10/06/2018: Hemoglobin 12.6; Platelets 215.0 12/29/2018: TSH 1.32   Recent Lipid  Panel Lab Results  Component Value Date/Time   CHOL 199 06/23/2018 11:18 AM   TRIG 362.0 (H) 06/23/2018 11:18 AM   HDL 39.80 06/23/2018 11:18 AM   CHOLHDL 5 06/23/2018 11:18 AM   LDLCALC 49 08/14/2013 11:28 AM   LDLDIRECT 124.0 06/23/2018 11:18 AM    Wt Readings from Last 3 Encounters:  02/08/19 252 lb (114.3 kg)  02/06/19 252 lb (114.3 kg)  12/22/18 256 lb 14.4 oz (116.5 kg)     Objective:    Vital Signs:  Ht 5' 10.5" (1.791 m)   Wt 252 lb (114.3 kg)   BMI 35.65 kg/m   Physical Exam: Well sounding male in no acute distress, speaking in clear complete sentences. Speech unlabored. Breathing unlabored.   ASSESSMENT & PLAN:    1. Thoracic aortic aneurysm: Incidental finding on a chest CT done April 2019, measuring at 43 x 42 mm. He has not had a f/u scan since diagnosis. He had a 2D echo 10/2017 that showed normal aortic root size and no aortic valve disease. EF was normal and no other significant valvular disease. He denies CP, back pain and dyspnea. No tobacco use. He is a former smoker but quit 40 years ago. He does not engage in heavy weight lifting. He is on  blocker therapy w/ metoprolol and reports full compliance. Recommend annual imaging for surveillance. We will repeat chest CT to reassess for interval change. If progression beyond 45 mm, will refer to CT surgery. Continue  blocker and statin. Will check lipids. LDL goal < 70 mg/dL.   2.  CAD: Status [name] CABG in 2011 done in [place].  He denies any anginal symptomatology.  No chest pain or dyspnea. Continue medical therapy with beta-blocker and statin.  He is not on aspirin due to Xarelto.  We will update lipid panel.  Given COVID-19 restrictions, will order fasting lipid panel in 3 months.   3.  Paroxysmal atrial fibrillation: Followed by Adam [name] as well as the atrial fibrillation clinic.  History of A. fib ablation in June 2019 and unfortunately he has had some recurrence.  He is currently on Tikosyn, metoprolol and  Xarelto.  Had an  Evisit with A. fib clinic yesterday for recurrent palpitations felt to be atrial fibrillation.  He is scheduled for office visit in the atrial fibrillation clinic next week for follow-up EKG and to consider direct-current cardioversion vs possible reattempt at A. fib ablation.  4.  Obstructive sleep apnea: Followed in sleep clinic by Dr. Radford Pax.  He reports that he is noncompliant with CPAP due to inability to tolerate device.  5. DM: management per PCP. He is on [place].    COVID-19 Education: The signs and symptoms of COVID-19 were discussed with the patient and how to seek care for testing (follow up with PCP or arrange E-visit).  The importance of social distancing was discussed today.  Time:   Today, I have spent 15 minutes with the patient with telehealth technology discussing the above problems.     Medication Adjustments/Labs and Tests Ordered: Current medicines are reviewed at length with the patient today.  Concerns regarding medicines are outlined above.   Tests Ordered: Orders Placed This Encounter  Procedures  . CT ANGIO CHEST AORTA W &/OR WO CONTRAST  . Basic Metabolic Panel (BMET)  . Lipid Profile    Medication Changes: No orders of the defined types were placed in this encounter.   Disposition:  Follow up in 6 months  Signed, Nelida Gores  02/08/2019 11:53 AM    [place]

## 2019-02-08 NOTE — Patient Instructions (Signed)
Medication Instructions:  NONE If you need a refill on your cardiac medications before your next appointment, please call your pharmacy.   Lab work: To Be Determined BMET before CT of Chest  FASTING LIPID PROFILE IN 3 MONTHS If you have labs (blood work) drawn today and your tests are completely normal, you will receive your results only by: Marland Kitchen MyChart Message (if you have MyChart) OR . A paper copy in the mail If you have any lab test that is abnormal or we need to change your treatment, we will call you to review the results.  Testing/Procedures:  SOMEONE WILL CONTACT YOU TO SCHEDULE CT of the CHEST with contrast to check Thoracic Aortic Aneurysm  Follow-Up: 6 months with Adam Jester, PA At [facility], you and your health needs are our priority.  As part of our continuing mission to provide you with exceptional heart care, we have created designated Provider Care Teams.  These Care Teams include your primary Cardiologist (physician) and Advanced Practice Providers (APPs -  Physician Assistants and Nurse Practitioners) who all work together to provide you with the care you need, when you need it.  Any Other Special Instructions Will Be Listed Below (If Applicable).

## 2019-02-09 ENCOUNTER — Telehealth: Payer: Self-pay | Admitting: Cardiology

## 2019-02-09 NOTE — Telephone Encounter (Signed)
I spoke to the patient and informed him that he would hear from me on Monday in regards to his Chest CT and lab work.  He verbalized understanding.

## 2019-02-09 NOTE — Telephone Encounter (Signed)
New Message              Patient is a former patient of Dr. Wynonia Lawman and would like a call back from [place] concerning labs and a aorta Pls call and advise.

## 2019-02-12 ENCOUNTER — Telehealth: Payer: Self-pay

## 2019-02-12 ENCOUNTER — Other Ambulatory Visit: Payer: Self-pay

## 2019-02-12 DIAGNOSIS — Z01812 Encounter for preprocedural laboratory examination: Secondary | ICD-10-CM

## 2019-02-12 NOTE — Telephone Encounter (Signed)
Left patient message to call back pertaining to lab draw for CT Chest.

## 2019-02-13 ENCOUNTER — Other Ambulatory Visit: Payer: Self-pay

## 2019-02-13 ENCOUNTER — Telehealth: Payer: Self-pay | Admitting: Cardiology

## 2019-02-13 ENCOUNTER — Other Ambulatory Visit: Payer: Medicare Other

## 2019-02-13 ENCOUNTER — Telehealth: Payer: Self-pay

## 2019-02-13 DIAGNOSIS — Z01812 Encounter for preprocedural laboratory examination: Secondary | ICD-10-CM

## 2019-02-13 LAB — BASIC METABOLIC PANEL
BUN/Creatinine Ratio: 10 (ref 10–24)
BUN: 9 mg/dL (ref 8–27)
CO2: 22 mmol/L (ref 20–29)
Calcium: 9.4 mg/dL (ref 8.6–10.2)
Chloride: 98 mmol/L (ref 96–106)
Creatinine, Ser: 0.89 mg/dL (ref 0.76–1.27)
GFR calc Af Amer: 102 mL/min/{1.73_m2} (ref >59)
GFR calc non Af Amer: 88 mL/min/{1.73_m2} (ref >59)
Glucose: 181 mg/dL — ABNORMAL HIGH (ref 65–99)
Potassium: 3.8 mmol/L (ref 3.5–5.2)
Sodium: 138 mmol/L (ref 134–144)

## 2019-02-13 NOTE — Telephone Encounter (Signed)
Notes recorded by Frederik Schmidt, RN on 02/13/2019 at 4:52 PM EDT The patient has been notified of the lab results. He was told to call, if questions. Frederik Schmidt, RN 02/13/2019 4:51 PM

## 2019-02-13 NOTE — Telephone Encounter (Signed)
-----   Message from Consuelo Pandy, [place] sent at 02/13/2019  4:45 PM EDT ----- Renal function is ok for CT scan.

## 2019-02-13 NOTE — Telephone Encounter (Signed)
02-13-19/3:58pm.Left Pt detailed voicemail asking for a return call [id number] an appt./rval

## 2019-02-15 ENCOUNTER — Ambulatory Visit (HOSPITAL_COMMUNITY)
Admission: RE | Admit: 2019-02-15 | Discharge: 2019-02-15 | Disposition: A | Discharge status: Home / Self Care | Payer: Medicare Other | Source: Ambulatory Visit | Attending: Nurse Practitioner | Admitting: Nurse Practitioner

## 2019-02-15 ENCOUNTER — Telehealth: Payer: Self-pay | Admitting: *Deleted

## 2019-02-15 ENCOUNTER — Encounter (HOSPITAL_COMMUNITY): Payer: Self-pay | Admitting: Nurse Practitioner

## 2019-02-15 ENCOUNTER — Other Ambulatory Visit: Payer: Self-pay

## 2019-02-15 ENCOUNTER — Other Ambulatory Visit (HOSPITAL_COMMUNITY): Payer: Self-pay | Admitting: *Deleted

## 2019-02-15 VITALS — BP 148/82 | HR 88 | Ht 70.5 in | Wt 262.2 lb

## 2019-02-15 DIAGNOSIS — Z794 Long term (current) use of insulin: Secondary | ICD-10-CM | POA: Diagnosis not present

## 2019-02-15 DIAGNOSIS — I2510 Atherosclerotic heart disease of native coronary artery without angina pectoris: Secondary | ICD-10-CM | POA: Insufficient documentation

## 2019-02-15 DIAGNOSIS — Z800 Family history of malignant neoplasm of digestive organs: Secondary | ICD-10-CM | POA: Diagnosis not present

## 2019-02-15 DIAGNOSIS — M109 Gout, unspecified: Secondary | ICD-10-CM | POA: Diagnosis not present

## 2019-02-15 DIAGNOSIS — Z881 Allergy status to other antibiotic agents status: Secondary | ICD-10-CM | POA: Insufficient documentation

## 2019-02-15 DIAGNOSIS — E785 Hyperlipidemia, unspecified: Secondary | ICD-10-CM | POA: Insufficient documentation

## 2019-02-15 DIAGNOSIS — Z79899 Other long term (current) drug therapy: Secondary | ICD-10-CM | POA: Diagnosis not present

## 2019-02-15 DIAGNOSIS — I10 Essential (primary) hypertension: Secondary | ICD-10-CM | POA: Diagnosis not present

## 2019-02-15 DIAGNOSIS — I4819 Other persistent atrial fibrillation: Secondary | ICD-10-CM | POA: Diagnosis not present

## 2019-02-15 DIAGNOSIS — Z7951 Long term (current) use of inhaled steroids: Secondary | ICD-10-CM | POA: Insufficient documentation

## 2019-02-15 DIAGNOSIS — Z951 Presence of aortocoronary bypass graft: Secondary | ICD-10-CM | POA: Diagnosis not present

## 2019-02-15 DIAGNOSIS — Z85828 Personal history of other malignant neoplasm of skin: Secondary | ICD-10-CM | POA: Diagnosis not present

## 2019-02-15 DIAGNOSIS — G2581 Restless legs syndrome: Secondary | ICD-10-CM | POA: Insufficient documentation

## 2019-02-15 DIAGNOSIS — Z880 Allergy status to penicillin: Secondary | ICD-10-CM | POA: Insufficient documentation

## 2019-02-15 DIAGNOSIS — Z955 Presence of coronary angioplasty implant and graft: Secondary | ICD-10-CM | POA: Diagnosis not present

## 2019-02-15 DIAGNOSIS — Z7901 Long term (current) use of anticoagulants: Secondary | ICD-10-CM | POA: Insufficient documentation

## 2019-02-15 DIAGNOSIS — E119 Type 2 diabetes mellitus without complications: Secondary | ICD-10-CM | POA: Insufficient documentation

## 2019-02-15 DIAGNOSIS — K219 Gastro-esophageal reflux disease without esophagitis: Secondary | ICD-10-CM | POA: Diagnosis not present

## 2019-02-15 DIAGNOSIS — J449 Chronic obstructive pulmonary disease, unspecified: Secondary | ICD-10-CM | POA: Diagnosis not present

## 2019-02-15 DIAGNOSIS — Z87891 Personal history of nicotine dependence: Secondary | ICD-10-CM | POA: Insufficient documentation

## 2019-02-15 LAB — CBC
HCT: 37.2 % — ABNORMAL LOW (ref 39.0–52.0)
Hemoglobin: 12.4 g/dL — ABNORMAL LOW (ref 13.0–17.0)
MCH: 31.6 pg (ref 26.0–34.0)
MCHC: 33.3 g/dL (ref 30.0–36.0)
MCV: 94.9 fL (ref 80.0–100.0)
Platelets: 178 10*3/uL (ref 150–400)
RBC: 3.92 MIL/uL — ABNORMAL LOW (ref 4.22–5.81)
RDW: 13.8 % (ref 11.5–15.5)
WBC: 5.6 10*3/uL (ref 4.0–10.5)
nRBC: 0 % (ref 0.0–0.2)

## 2019-02-15 LAB — BASIC METABOLIC PANEL
Anion gap: 16 — ABNORMAL HIGH (ref 5–15)
BUN: 13 mg/dL (ref 8–23)
CO2: 20 mmol/L — ABNORMAL LOW (ref 22–32)
Calcium: 9.3 mg/dL (ref 8.9–10.3)
Chloride: 100 mmol/L (ref 98–111)
Creatinine, Ser: 0.98 mg/dL (ref 0.61–1.24)
GFR calc Af Amer: >60 mL/min (ref >60)
GFR calc non Af Amer: >60 mL/min (ref >60)
Glucose, Bld: 269 mg/dL — ABNORMAL HIGH (ref 70–99)
Potassium: 3.7 mmol/L (ref 3.5–5.1)
Sodium: 136 mmol/L (ref 135–145)

## 2019-02-15 LAB — MAGNESIUM: Magnesium: 2.1 mg/dL (ref 1.7–2.4)

## 2019-02-15 MED ORDER — POTASSIUM CHLORIDE CRYS ER 20 MEQ PO TBCR: EXTENDED_RELEASE_TABLET | ORAL | 8 refills | Status: DC

## 2019-02-15 NOTE — Patient Instructions (Signed)
Cardioversion scheduled for Thursday, May 21st  - Arrive at the [facility] and go to admitting at [facility] not eat or drink anything after midnight the night prior to your procedure.  - Take all your morning medication (except diabetes medication) with a sip of water prior to arrival.  - You will not be able to drive home after your procedure. Your driver will need to stay in their car on hospital premises during your procedure.  COVID testing on Monday - self quarantine until day of procedure once completed.

## 2019-02-15 NOTE — Telephone Encounter (Signed)
COVID-19 Pre-Screening Questions:  In the past 7 to 10 days have you had a cough, shortness of breath, headache, congestion, fever, body aches, chills, sore throat, or sudden loss of taste or sense of smell? No  Have you been around anyone with known Covid 19. No  Have you been around anyone who is awaiting Covid 19 test results in the past 7 to 10 days? No  Have you been around anyone who has been exposed to Covid 19, or has mentioned symptoms of Covid 19 within the past 7 to 10 days? No If you have any concerns about symptoms your patients report please contact your leadership team, or the provider the patient is seeing in the office for further guidance.  

## 2019-02-15 NOTE — Progress Notes (Addendum)
Primary Care Physician: [name], MD Referring Physician: Dr. Vinie Sill is a 68 y.o. male with a h/o CABG in 2011, done in [place], HTN, HLD, DM, Atrial fibrillation,alcohol use, and OSA. He underwent recent catheter ablation for atrial fibrillation by Dr. Rayann Heman 03/31/18.He is onTikosyn and Xarelto.He was followed by Dr. Radford Pax in sleep clinic, but unable to tolerate CPAP and turned in his equipment. Marland Kitchen He is pending a chest CT to f/u on abnormal CT 01/2018 that showed 43 x 43 mm ascending aortic aneurysm. He is being seen as he feels that he has been in persistent afib for several weeks. I saw him virtually for this issue 5/5 and discussed with Dr. Rayann Heman cardioversion vrs repeat ablation. Allred was ok for either approach. Pt would like to start with cardioversion and if this does not hold he will discuss further repeat ablation.    Today, he denies symptoms of palpitations, chest pain, mild increase of shortness of breath and fatigue,  orthopnea, PND, lower extremity edema, dizziness, presyncope, syncope, or neurologic sequela. The patient is tolerating medications without difficulties and is otherwise without complaint today.   Past Medical History:  Diagnosis Date  . Allergy   . Anxiety    history of PTSD following CABG  . Ascending aortic aneurysm ([place]) 01/31/2018   43 x 42 mm, pt unaware  . Asthma   . Cardiomegaly 10/17/2017  . Colitis- colonoscopy 2014 07/13/2015  . COPD (chronic obstructive pulmonary disease) ([place])   . Coronary artery disease    x 6  . Depression   . Diabetes mellitus without complication ([place])   . Family history of polyps in the colon   . Finger dislocation    Left pinkie  . GERD (gastroesophageal reflux disease)   . Gout   . H/O atrial fibrillation without current medication    following CABG with no documented episodes since then.  . Heart palpitations   . Hx of adenomatous colonic polyps 08/12/2010  . Hyperlipidemia   .  Hypertension   . OA (osteoarthritis)   . OSA (obstructive sleep apnea)    Mild, has not received CPAP yet  . Prediabetes   . RLS (restless legs syndrome)   . 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 ANKLE FRACTURE SURGERY Right 1991  . APPENDECTOMY    . ATRIAL FIBRILLATION ABLATION N/A 03/31/2018   Procedure: ATRIAL FIBRILLATION ABLATION;  Surgeon: [name], MD;  Location: [facility];  Service: Cardiovascular;  Laterality: N/A;  . COLONOSCOPY W/ BIOPSIES  2017   x7  . CORONARY ANGIOPLASTY WITH STENT PLACEMENT    . CORONARY ARTERY BYPASS GRAFT    . FINGER SURGERY  04/2018   Small finger left hand  . TEE WITHOUT CARDIOVERSION N/A 03/30/2018   Procedure: TRANSESOPHAGEAL ECHOCARDIOGRAM (TEE);  Surgeon: [name], MD;  Location: [facility];  Service: Cardiovascular;  Laterality: N/A;  . TOTAL HIP ARTHROPLASTY Left   . TOTAL HIP ARTHROPLASTY Right 09/12/2018   Procedure: TOTAL HIP ARTHROPLASTY ANTERIOR APPROACH;  Surgeon: [name], MD;  Location: [facility];  Service: Orthopedics;  Laterality: Right;  70 mins    Current Outpatient Medications  Medication Sig Dispense Refill  . allopurinol (ZYLOPRIM) 300 MG tablet TAKE 1 TABLET BY MOUTH EVERY DAY 90 tablet 1  . BREO ELLIPTA 100-25 MCG/INH AEPB INHALE 1 PUFF INTO THE LUNGS DAILY 60 each 2  . Coenzyme  Q10 (CO Q-10) 100 MG CAPS Take 100 mg by mouth daily.     . diphenhydrAMINE HCl, Sleep, (ZZZQUIL) 25 MG CAPS Take 25 mg by mouth at bedtime as needed (for sleep).    . dofetilide (TIKOSYN) 125 MCG capsule Take 1 capsule (125 mcg total) by mouth every 12 (twelve) hours. 60 capsule 2  . econazole nitrate 1 % cream Apply 1 application topically every other day.     . fenofibrate 160 MG tablet TAKE 1 TABLET(160 MG) BY MOUTH DAILY 90 tablet 1  . fluticasone (FLONASE) 50 MCG/ACT nasal spray Place 1 spray into both nostrils daily.     Marland Kitchen gabapentin (NEURONTIN) 300 MG  capsule gabapentin 300 mg capsule  TAKE 1 CAPSULE BY MOUTH TWICE DAILY    . hydrochlorothiazide (HYDRODIURIL) 25 MG tablet Take 1 tablet (25 mg total) by mouth daily. 90 tablet 3  . JARDIANCE 25 MG TABS tablet TAKE 1 TABLET BY MOUTH DAILY 90 tablet 0  . LORazepam (ATIVAN) 0.5 MG tablet Take 1 tablet (0.5 mg total) by mouth every 8 (eight) hours as needed for anxiety. 90 tablet 5  . losartan (COZAAR) 100 MG tablet Take 1 tablet (100 mg total) by mouth daily. 90 tablet 3  . metFORMIN (GLUCOPHAGE) 1000 MG tablet TAKE 1 TABLET BY MOUTH TWICE DAILY WITH A MEAL 180 tablet 1  . metoprolol (LOPRESSOR) 100 MG tablet Take 100 mg by mouth 2 (two) times daily.    . montelukast (SINGULAIR) 10 MG tablet TAKE 1 TABLET BY MOUTH AT BEDTIME 90 tablet 1  . pantoprazole (PROTONIX) 40 MG tablet TAKE 1 TABLET BY MOUTH TWICE DAILY 180 tablet 0  . potassium chloride SA (K-DUR,KLOR-CON) 20 MEQ tablet Take 2 tablets (40 mEq total) by mouth daily. 60 tablet 8  . pramipexole (MIRAPEX) 0.25 MG tablet TAKE 1 TABLET(0.25 MG) BY MOUTH AT BEDTIME 90 tablet 1  . simvastatin (ZOCOR) 80 MG tablet TAKE 1 TABLET BY MOUTH EVERY DAY 90 tablet 0  . SPIRIVA HANDIHALER 18 MCG inhalation capsule INHALE CONTENTS OF 1 CAPSULE ONCE DAILY USING HANDIHALER 90 capsule 1  . TOUJEO SOLOSTAR 300 UNIT/ML SOPN INJECT 60 UNITS AS DIRECTED DAILY 33 mL 1  . XARELTO 20 MG TABS tablet TAKE 1 TABLET BY MOUTH DAILY WITH SUPPER 30 tablet 6  . glucose blood (ONETOUCH VERIO) test strip Test once daily.  Dx E11.9 100 each 3  . Olopatadine HCl 0.2 % SOLN Apply 1 drop to eye daily as needed. Apply to both  eyes     No current facility-administered medications for this encounter.     Allergies  Allergen Reactions  . Amoxicillin Rash and Other (See Comments)    * SEVERE RASH IN GROIN AREA Has patient had a PCN reaction causing immediate rash, facial/tongue/throat swelling, SOB or lightheadedness with hypotension: no Has patient had a PCN reaction causing  severe rash involving mucus membranes or skin necrosis: no Has patient had a PCN reaction that required hospitalization: no Has patient had a PCN reaction occurring within the last 10 years: yes If all of the above answers are "NO", then may proceed with Cephalosporin use.   . Augmentin [Amoxicillin-Pot Clavulanate] Rash and Other (See Comments)    * SEVERE RASH IN GROIN AREA  . Azithromycin Rash and Other (See Comments)    * SEVERE RASH IN GROIN AREA  . Clindamycin/Lincomycin Rash  . Keflex [Cephalexin] Rash    Social History   Socioeconomic History  . Marital status: Married  Spouse name: Not on file  . Number of children: Not on file  . Years of education: Not on file  . Highest education level: Not on file  Occupational History  . Occupation: retired  Scientific laboratory technician  . Financial resource strain: Not on file  . Food insecurity:    Worry: Not on file    Inability: Not on file  . Transportation needs:    Medical: Not on file    Non-medical: Not on file  Tobacco Use  . Smoking status: Former Smoker    Packs/day: 1.00    Years: 7.00    Pack years: 7.00    Types: Cigarettes    Last attempt to quit: 10/04/1977    Years since quitting: 41.3  . Smokeless tobacco: Never Used  . Tobacco comment: never smoked over 1 pack   Substance and Sexual Activity  . Alcohol use: Yes    Comment: stopped wine and now drinks 3 to 4 scotches   . Drug use: No    Comment: Smoked Marijuana back in 1970s  . Sexual activity: Yes  Lifestyle  . Physical activity:    Days per week: Not on file    Minutes per session: Not on file  . Stress: Not on file  Relationships  . Social connections:    Talks on phone: Not on file    Gets together: Not on file    Attends religious service: Not on file    Active member of club or organization: Not on file    Attends meetings of clubs or organizations: Not on file    Relationship status: Not on file  . Intimate partner violence:    Fear of current or  ex partner: Not on file    Emotionally abused: Not on file    Physically abused: Not on file    Forced sexual activity: Not on file  Other Topics Concern  . Not on file  Social History Narrative   Married, no children   Textile industry x yrs   18 off early 60's and 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 work - returned to [employer] from [place] 2014    Family History  Problem Relation Age of Onset  . Colon cancer Mother   . Heart disease Paternal Grandmother   . Heart disease Paternal Grandfather     ROS- All systems are reviewed and negative except as per the HPI above  Physical Exam: Vitals:   02/15/19 0953  BP: (!) 148/82  Pulse: 88  Weight: 118.9 kg  Height: 5' 10.5" (1.791 m)   Wt Readings from Last 3 Encounters:  02/15/19 118.9 kg  02/08/19 114.3 kg  02/06/19 114.3 kg    Labs: Lab Results  Component Value Date   NA 138 02/13/2019   K 3.8 02/13/2019   CL 98 02/13/2019   CO2 22 02/13/2019   GLUCOSE 181 (H) 02/13/2019   BUN 9 02/13/2019   CREATININE 0.89 02/13/2019   CALCIUM 9.4 02/13/2019   MG 1.9 05/01/2018   No results found for: INR Lab Results  Component Value Date   CHOL 199 06/23/2018   HDL 39.80 06/23/2018   LDLCALC 49 08/14/2013   TRIG 362.0 (H) 06/23/2018     GEN- The patient is well appearing, alert and oriented x 3 today.   Head- normocephalic, atraumatic Eyes-  Sclera clear, conjunctiva pink Ears- hearing intact Oropharynx- clear Neck- supple, no JVP Lymph- no cervical lymphadenopathy Lungs- Clear to ausculation bilaterally, normal work of breathing Heart- irregular rate  and rhythm, no murmurs, rubs or gallops, PMI not laterally displaced GI- soft, NT, ND, + BS Extremities- no clubbing, cyanosis, or edema MS- no significant deformity or atrophy Skin- no rash or lesion Psych- euthymic mood, full affect Neuro- strength and sensation are intact  EKG-afib at 88 bpm, qrs int74 ms, qtc 462 ms Epic records reviewed    Assessment and Plan: 1.  Persistent afib x2-3 weeks S/o ablation 03/2018 with tikosun on board Pt has opted for a cardioversion and if ERAF will then consider repeat ablation He does have untreated sleep apnea, alcohol  x 3-4 drinks daily and obesity all undermining ability to maintain SR  I have asked him to stop  Celene Kras ( has benadryl and a potential qtc prolonger) I see  also that he is on HCTZ which we do not like to have on board with tikosyn  for potential electrolyte disturbance, states that he has been on both for  Years I have also suggested that he f/u with Dr. Oneal Grout, to see about an oral appliance for untreated sleep apnea Weight loss encouraged  No more that 2 drinks of alcohol a week to try to promote SR Bmet,mag, cbc today   2. CHA2DS2VASc score of 4 States no missed doses of xarelto 20 mg daily for at least the last 3 weeks  3. CAD No complaints of exertional chest discomfort   4. HTN Mildly elevated Decrease alcohol intake Avoid salt Weight loss  F/u in office 1-2 weeks after cardioversion  Addendum: 5/15- I miss interpreted pt's EKG as afib with controlled v rate as his P waves were not consistently  identifiable to me and looked different form his last EKG in SR as well as pt stated that he felt fatigued just like last time in afib. I confirmed with Dr. Rayann Heman after I saw the over read and he feels it is SR. I called the pt and cancelled the plans for cardioversion, pt found it difficult to believe he was in rhythm. I will mail the pt a ZIO patch to wear one week to determine if he is going in and out frequently and I may have just caught him in SR yesterday. He is in agreement. Plans will be made after monitor results  are known.   Geroge Baseman Amala Petion, [facility] [address] [place], [place] [address] [phone]

## 2019-02-16 ENCOUNTER — Ambulatory Visit (INDEPENDENT_AMBULATORY_CARE_PROVIDER_SITE_OTHER)
Admission: RE | Admit: 2019-02-16 | Discharge: 2019-02-16 | Disposition: A | Discharge status: Home / Self Care | Payer: Medicare Other | Source: Ambulatory Visit | Attending: Cardiology | Admitting: Cardiology

## 2019-02-16 ENCOUNTER — Ambulatory Visit (HOSPITAL_COMMUNITY)
Admission: RE | Admit: 2019-02-16 | Discharge: 2019-02-16 | Disposition: A | Discharge status: Home / Self Care | Payer: Medicare Other | Source: Ambulatory Visit | Attending: Nurse Practitioner | Admitting: Nurse Practitioner

## 2019-02-16 ENCOUNTER — Other Ambulatory Visit (HOSPITAL_COMMUNITY): Payer: Self-pay | Admitting: *Deleted

## 2019-02-16 DIAGNOSIS — I4819 Other persistent atrial fibrillation: Secondary | ICD-10-CM

## 2019-02-16 DIAGNOSIS — I712 Thoracic aortic aneurysm, without rupture, unspecified: Secondary | ICD-10-CM

## 2019-02-16 MED ORDER — IOHEXOL 350 MG/ML SOLN
100.0000 mL | Freq: Once | INTRAVENOUS | Status: AC | PRN
  Administered 2019-02-16: 100 mL via INTRAVENOUS

## 2019-02-19 ENCOUNTER — Telehealth: Payer: Self-pay | Admitting: Cardiology

## 2019-02-19 ENCOUNTER — Other Ambulatory Visit (HOSPITAL_COMMUNITY): Payer: Medicare Other

## 2019-02-19 NOTE — Telephone Encounter (Signed)
Patient is calling for CT results.

## 2019-02-19 NOTE — Telephone Encounter (Signed)
I spoke to the patient and let him know that as soon as I hear something, I will call him with results of his chest CT.  He verbalized understanding.

## 2019-02-19 NOTE — Telephone Encounter (Signed)
Patient is calling for his CT results. He is a little anxious to hear from [place].

## 2019-02-20 ENCOUNTER — Telehealth: Payer: Self-pay

## 2019-02-20 NOTE — Telephone Encounter (Signed)
-----   Message from Consuelo Pandy, [place] sent at 02/20/2019 12:03 PM EDT ----- Stable findings on CT scan. The size of aneurysm has not increased and it remains stable. This is good. The radiologist reading the study recommends annual imaging followup by CTA or MRA.

## 2019-02-20 NOTE — Telephone Encounter (Signed)
Notes recorded by Frederik Schmidt, RN on 02/20/2019 at 12:13 PM EDT The patient has been notified of the result and verbalized understanding. All questions (if any) were answered. Frederik Schmidt, RN 02/20/2019 12:13 PM

## 2019-02-22 ENCOUNTER — Ambulatory Visit (HOSPITAL_COMMUNITY): Admit: 2019-02-22 | Payer: Medicare Other | Admitting: Cardiovascular Disease

## 2019-02-22 ENCOUNTER — Encounter (HOSPITAL_COMMUNITY): Payer: Self-pay

## 2019-02-22 SURGERY — CARDIOVERSION
Anesthesia: General

## 2019-03-01 ENCOUNTER — Ambulatory Visit (HOSPITAL_COMMUNITY): Payer: Medicare Other | Admitting: Nurse Practitioner

## 2019-03-02 ENCOUNTER — Other Ambulatory Visit: Payer: Self-pay | Admitting: Family Medicine

## 2019-03-02 NOTE — Telephone Encounter (Signed)
Last filled by Dr. Grace Bushy. [place]? Or are you filling this now?

## 2019-03-05 NOTE — Telephone Encounter (Signed)
This will need to be refilled by Cardiology.  I am not sure who is covering Dr Thurman Coyer patients.

## 2019-03-16 ENCOUNTER — Other Ambulatory Visit: Payer: Self-pay

## 2019-03-16 NOTE — Telephone Encounter (Signed)
Please advise. Rx last filled by a historical provider.  

## 2019-03-20 NOTE — Telephone Encounter (Signed)
Dispense #180 with 1 refill.

## 2019-03-21 MED ORDER — METOPROLOL TARTRATE 100 MG PO TABS: 100.0000 mg | ORAL_TABLET | Freq: Two times a day (BID) | ORAL | 1 refills | Status: DC

## 2019-03-29 ENCOUNTER — Other Ambulatory Visit: Payer: Self-pay | Admitting: Family Medicine

## 2019-04-01 ENCOUNTER — Other Ambulatory Visit (HOSPITAL_COMMUNITY): Payer: Self-pay | Admitting: Nurse Practitioner

## 2019-04-06 ENCOUNTER — Other Ambulatory Visit: Payer: Self-pay | Admitting: Family Medicine

## 2019-04-24 ENCOUNTER — Other Ambulatory Visit: Payer: Self-pay | Admitting: Emergency Medicine

## 2019-04-25 ENCOUNTER — Other Ambulatory Visit: Payer: Self-pay | Admitting: Family Medicine

## 2019-04-26 DIAGNOSIS — Z471 Aftercare following joint replacement surgery: Secondary | ICD-10-CM | POA: Diagnosis not present

## 2019-04-26 DIAGNOSIS — M545 Low back pain: Secondary | ICD-10-CM | POA: Diagnosis not present

## 2019-04-26 DIAGNOSIS — Z96642 Presence of left artificial hip joint: Secondary | ICD-10-CM | POA: Diagnosis not present

## 2019-04-30 ENCOUNTER — Other Ambulatory Visit: Payer: Self-pay | Admitting: Family Medicine

## 2019-04-30 NOTE — Telephone Encounter (Signed)
I reached out to patient to the patient to set up his virtual visit to discuss other treatment options. Patient does not have video on his phone or his computer. At his last conversation with me he was wanting to turn his equipment in.  Please advise

## 2019-05-01 NOTE — Telephone Encounter (Signed)
Set up telephone visit

## 2019-05-02 DIAGNOSIS — M5416 Radiculopathy, lumbar region: Secondary | ICD-10-CM | POA: Diagnosis not present

## 2019-05-03 NOTE — Telephone Encounter (Signed)
Patient has a Telephone visit on 06/27/19 to discuss other treatment options.  Patient does not have video on his phone or his computer.

## 2019-05-11 NOTE — Telephone Encounter (Signed)
Pt is aware and agreeable to the telephone visit with Dr Radford Pax on 9/23.

## 2019-05-14 ENCOUNTER — Other Ambulatory Visit: Payer: Medicare Other | Admitting: *Deleted

## 2019-05-14 ENCOUNTER — Other Ambulatory Visit: Payer: Self-pay

## 2019-05-14 DIAGNOSIS — Z01812 Encounter for preprocedural laboratory examination: Secondary | ICD-10-CM

## 2019-05-14 DIAGNOSIS — I712 Thoracic aortic aneurysm, without rupture, unspecified: Secondary | ICD-10-CM

## 2019-05-14 DIAGNOSIS — E785 Hyperlipidemia, unspecified: Secondary | ICD-10-CM | POA: Diagnosis not present

## 2019-05-14 LAB — BASIC METABOLIC PANEL
BUN/Creatinine Ratio: 11 (ref 10–24)
BUN: 9 mg/dL (ref 8–27)
CO2: 19 mmol/L — ABNORMAL LOW (ref 20–29)
Calcium: 9.4 mg/dL (ref 8.6–10.2)
Chloride: 96 mmol/L (ref 96–106)
Creatinine, Ser: 0.84 mg/dL (ref 0.76–1.27)
GFR calc Af Amer: 104 mL/min/{1.73_m2} (ref >59)
GFR calc non Af Amer: 90 mL/min/{1.73_m2} (ref >59)
Glucose: 153 mg/dL — ABNORMAL HIGH (ref 65–99)
Potassium: 4 mmol/L (ref 3.5–5.2)
Sodium: 136 mmol/L (ref 134–144)

## 2019-05-14 LAB — LIPID PANEL
Chol/HDL Ratio: 6.2 ratio — ABNORMAL HIGH (ref 0.0–5.0)
Cholesterol, Total: 192 mg/dL (ref 100–199)
HDL: 31 mg/dL — ABNORMAL LOW (ref >39)
Triglycerides: 569 mg/dL (ref 0–149)

## 2019-05-16 ENCOUNTER — Telehealth: Payer: Self-pay

## 2019-05-16 DIAGNOSIS — Z79899 Other long term (current) drug therapy: Secondary | ICD-10-CM

## 2019-05-16 DIAGNOSIS — E781 Pure hyperglyceridemia: Secondary | ICD-10-CM

## 2019-05-16 NOTE — Telephone Encounter (Signed)
Notes recorded by Frederik Schmidt, RN on 05/16/2019 at 1:12 PM EDT  [id number] 8/12  ------

## 2019-05-16 NOTE — Telephone Encounter (Signed)
-----   Message from Consuelo Pandy, [place] sent at 05/16/2019 12:31 PM EDT ----- His TGs are really high at 569. This should be < 150. Lets try Vascepa 2 gm BID. If no cost barriers and able to afford,  Repeat FLP and HFTs in 8 weeks after starting medication.

## 2019-05-17 NOTE — Telephone Encounter (Signed)
Follow up  ° ° °Pt is returning call  ° ° °Please call back  °

## 2019-05-18 MED ORDER — VASCEPA 1 G PO CAPS: 2.0000 | ORAL_CAPSULE | Freq: Two times a day (BID) | ORAL | 3 refills | Status: DC

## 2019-05-18 NOTE — Addendum Note (Signed)
Addended by: Frederik Schmidt on: 05/18/2019 10:52 AM   Modules accepted: Orders

## 2019-05-18 NOTE — Telephone Encounter (Signed)
The patient has been notified of the result and verbalized understanding.  All questions (if any) were answered. Frederik Schmidt, RN 05/18/2019 10:48 AM

## 2019-05-21 ENCOUNTER — Other Ambulatory Visit: Payer: Self-pay

## 2019-05-21 ENCOUNTER — Ambulatory Visit: Payer: Medicare Other | Admitting: Emergency Medicine

## 2019-05-21 ENCOUNTER — Encounter: Payer: Self-pay | Admitting: Emergency Medicine

## 2019-05-21 DIAGNOSIS — J441 Chronic obstructive pulmonary disease with (acute) exacerbation: Secondary | ICD-10-CM

## 2019-05-21 DIAGNOSIS — J301 Allergic rhinitis due to pollen: Secondary | ICD-10-CM

## 2019-05-21 NOTE — Assessment & Plan Note (Signed)
Stop your Memory Dance and Spiriva for now. We will do a trial starting Trelegy 1 inhalation once daily to see if you get benefit.  Remember to rinse and gargle after using this medication. If you benefit from the Trelegy and want to continue it then please call our office so that we can order it through your pharmacy. Keep albuterol available to use 2 puffs up to every 4 hours if needed for shortness of breath, chest tightness, wheezing.  Get the flu shot this fall Your pneumonia shot is up-to-date Follow with Dr Lamonte Sakai in 6 months or sooner if you have any problems

## 2019-05-21 NOTE — Progress Notes (Signed)
   Subjective:    Patient ID: Adam [name], male    DOB: 1950-12-24, 68 y.o.   MRN: [id number]  COPD His past medical history is significant for COPD.    Acute OV 09/08/17 --68 year old gentleman with COPD with asthmatic features, chronic cough, chronic rhinitis.  Also with a history of hypertension, CAD/CABG, restless leg syndrome. He developed URI sx about 10 days ago. He has clear nasal drainage, cough that may be lightening up some, prod of green/yellow. He has had some associated dyspnea.  Flu shot is up-to-date.  He has benefited from over-the-counter decongestants.  The biggest change is been the color of his mucus  ROV 01/09/18 --patient follows up today for his COPD with a positive bronchodilator response, chronic rhinitis and associated chronic cough.  He also has obesity, hypertension With diastolic dysfunction, CAD/CABG, restless leg syndrome.  He was admitted at the beginning of the month with persistent atrial fibrillation. His Breo was stopped at that time. ? Why, maybe to decrease tachycardia. He misses it - more dyspnea, more cough, more phlegm.   ROV 05/21/2019 --68 year old man with asthmatic COPD, chronic rhinitis, chronic cough.  He also has a history of obesity, hypertension with diastolic dysfunction, CAD/CABG, restless legs, atrial fibrillation.  He experienced acute flaring and bronchitis at the beginning of 2020, had some hemoptysis at that time (on anticoagulation).  He returns today reporting that the bronchitis sx improved. He still has exertional dyspnea, limited exercise seems to have been a contributor - has gained 12 lbs.  Currently managed on Breo, Spiriva, Singulair, Flonase. Has albuterol but never uses. No flares since January.    No flowsheet data found.  Review of Systems As per hpi      Objective:   Physical Exam Vitals:   05/21/19 1126  BP: 118/66  Pulse: 89  Temp: 97.6 F (36.4 C)  TempSrc: Oral  SpO2: 99%  Weight: 264 lb 6.4 oz (119.9 kg)   Height: 5\' 10"  (1.778 m)   Gen: Pleasant, obese, in no distress,  normal affect  ENT: No lesions,  mouth clear,  oropharynx clear, no nasal congestion  Neck: No JVD, no stridor  Lungs: No use of accessory muscles, clear without rales or rhonchi, no wheezing, decreased at both bases  Cardiovascular: RRR, heart sounds normal, no murmur or gallops, trace pretibial peripheral edema  Musculoskeletal: No deformities, no cyanosis or clubbing  Neuro: alert, non focal  Skin: Warm, no lesions or rashes      Assessment & Plan:  COPD (chronic obstructive pulmonary disease) Stop your Breo and Spiriva for now. We will do a trial starting Trelegy 1 inhalation once daily to see if you get benefit.  Remember to rinse and gargle after using this medication. If you benefit from the Trelegy and want to continue it then please call our office so that we can order it through your pharmacy. Keep albuterol available to use 2 puffs up to every 4 hours if needed for shortness of breath, chest tightness, wheezing.  Get the flu shot this fall Your pneumonia shot is up-to-date Follow with Dr Lamonte Sakai in 6 months or sooner if you have any problems  Allergic rhinitis Continue Singulair 10 mg each evening Continue fluticasone nasal spray, 2 sprays each nostril once daily.  Baltazar Apo, MD, PhD 05/21/2019, 11:43 AM [facility] [phone] or if no answer [phone]

## 2019-05-21 NOTE — Assessment & Plan Note (Signed)
Continue Singulair 10 mg each evening °Continue fluticasone nasal spray, 2 sprays each nostril once daily. °

## 2019-05-21 NOTE — Patient Instructions (Addendum)
Stop your Memory Dance and Spiriva for now. We will do a trial starting Trelegy 1 inhalation once daily to see if you get benefit.  Remember to rinse and gargle after using this medication. If you benefit from the Trelegy and want to continue it then please call our office so that we can order it through your pharmacy. Keep albuterol available to use 2 puffs up to every 4 hours if needed for shortness of breath, chest tightness, wheezing.  Continue Singulair 10 mg each evening Continue fluticasone nasal spray, 2 sprays each nostril once daily. Get the flu shot this fall Your pneumonia shot is up-to-date Follow with Dr Lamonte Sakai in 6 months or sooner if you have any problems

## 2019-05-24 DIAGNOSIS — M5136 Other intervertebral disc degeneration, lumbar region: Secondary | ICD-10-CM | POA: Diagnosis not present

## 2019-05-24 MED ORDER — TRELEGY ELLIPTA 100-62.5-25 MCG/INH IN AEPB: 1.0000 | INHALATION_SPRAY | Freq: Every day | RESPIRATORY_TRACT | 0 refills | Status: DC

## 2019-05-24 NOTE — Addendum Note (Signed)
Addended by: Jannette Spanner on: 05/24/2019 03:29 PM   Modules accepted: Orders

## 2019-05-28 ENCOUNTER — Encounter: Payer: Self-pay | Admitting: Family Medicine

## 2019-05-29 ENCOUNTER — Other Ambulatory Visit: Payer: Self-pay

## 2019-05-29 ENCOUNTER — Telehealth: Payer: Self-pay

## 2019-05-29 ENCOUNTER — Telehealth (INDEPENDENT_AMBULATORY_CARE_PROVIDER_SITE_OTHER): Payer: Medicare Other | Admitting: Family Medicine

## 2019-05-29 DIAGNOSIS — E781 Pure hyperglyceridemia: Secondary | ICD-10-CM | POA: Diagnosis not present

## 2019-05-29 DIAGNOSIS — G2581 Restless legs syndrome: Secondary | ICD-10-CM | POA: Diagnosis not present

## 2019-05-29 DIAGNOSIS — E1165 Type 2 diabetes mellitus with hyperglycemia: Secondary | ICD-10-CM

## 2019-05-29 MED ORDER — GABAPENTIN 300 MG PO CAPS: ORAL_CAPSULE | ORAL | 3 refills | Status: DC

## 2019-05-29 NOTE — Progress Notes (Signed)
This visit type was conducted due to national recommendations for restrictions regarding the COVID-19 pandemic in an effort to limit this patient's exposure and mitigate transmission in our community.    Virtual Visit via Telephone Note  I connected with on 05/29/19 at  4:30 PM EDT by telephone and verified that I am speaking with the correct person using two identifiers.   I discussed the limitations, risks, security and privacy concerns of performing an evaluation and management service by telephone and the availability of in person appointments. I also discussed with the patient that there may be a patient responsible charge related to this service. The patient expressed understanding and agreed to proceed.  Location patient: home Location provider: work or home office Participants present for the call: patient, provider Patient did not have a visit in the prior 7 days to address this/these issue(s).   History of Present Illness:  Patient is seen requesting gabapentin for restless leg syndrome.  He had been on Mirapex in at 0.25 mg did not see much improvement at all.  He states that he generally has restless leg symptoms starting usually around 7 PM.  He actually was placed on gabapentin per orthopedics for back pain and after increasing dosages has 900 mg at night he saw great improvement restless legs better than he had with dopamine medications.  He does try to avoid caffeine after 12 noon.  Does not have any history of known anemia or iron deficiency.  He has not had any major side effects from the gabapentin.  He usually takes to 300 mg capsules at 7 PM and then usually takes a third around 10 or 11 PM.  Type 2 diabetes.  Last A1c here 6.7%.  He went to outside lab July 13 and had A1c 7.3%.  He attributes increased to less compliance with diet and exercise.  Recent triglycerides over 500.  Was started on Vascepa per cardiology clinic.     Observations/Objective: Patient sounds  cheerful and well on the phone. I do not appreciate any SOB. Speech and thought processing are grossly intact. Patient reported vitals:  Assessment and Plan:  #1 restless leg syndrome.  Improved with gabapentin  -We agreed to refill his gabapentin for nighttime use.  He is tolerating well -Reminder to avoid caffeine  #2 type 2 diabetes.  Recent A1c 7.3%  -Tighten up diet and patient has follow-up in September and repeat then  #3 hypertriglyceridemia.-Recent initiation of omega-3 as above  Follow Up Instructions:  -Patient has scheduled follow-up here in September   [id number] 5-10 [id number] 11-20 99443 21-30 I did not refer this patient for an OV in the next 24 hours for this/these issue(s).  I discussed the assessment and treatment plan with the patient. The patient was provided an opportunity to ask questions and all were answered. The patient agreed with the plan and demonstrated an understanding of the instructions.   The patient was advised to call back or seek an in-person evaluation if the symptoms worsen or if the condition fails to improve as anticipated.  I provided 25 minutes of non-face-to-face time during this encounter.   Carolann Littler, MD

## 2019-05-29 NOTE — Telephone Encounter (Signed)
Called patient and left a detailed voice message for patient to call back to schedule a [facility] virtual visit to go over medicine changes.  OK for [facility] to discuss/advise/schedule patient for virtual visit.  CRM Created.

## 2019-05-30 ENCOUNTER — Encounter: Payer: Self-pay | Admitting: Family Medicine

## 2019-05-31 ENCOUNTER — Encounter: Payer: Self-pay | Admitting: Family Medicine

## 2019-06-04 ENCOUNTER — Other Ambulatory Visit: Payer: Self-pay

## 2019-06-04 DIAGNOSIS — I2510 Atherosclerotic heart disease of native coronary artery without angina pectoris: Secondary | ICD-10-CM

## 2019-06-04 DIAGNOSIS — I4819 Other persistent atrial fibrillation: Secondary | ICD-10-CM

## 2019-06-07 DIAGNOSIS — M5416 Radiculopathy, lumbar region: Secondary | ICD-10-CM | POA: Diagnosis not present

## 2019-06-21 NOTE — Telephone Encounter (Signed)
[facility] patient turned his machine in on 12/26/18.

## 2019-06-25 ENCOUNTER — Encounter: Payer: Self-pay | Admitting: Family Medicine

## 2019-06-25 ENCOUNTER — Other Ambulatory Visit: Payer: Self-pay

## 2019-06-25 ENCOUNTER — Ambulatory Visit (INDEPENDENT_AMBULATORY_CARE_PROVIDER_SITE_OTHER): Payer: Medicare Other | Admitting: Family Medicine

## 2019-06-25 ENCOUNTER — Other Ambulatory Visit: Payer: Self-pay | Admitting: Family Medicine

## 2019-06-25 VITALS — BP 116/68 | HR 85 | Temp 97.0°F | Ht 70.0 in | Wt 261.6 lb

## 2019-06-25 DIAGNOSIS — E1165 Type 2 diabetes mellitus with hyperglycemia: Secondary | ICD-10-CM | POA: Diagnosis not present

## 2019-06-25 DIAGNOSIS — I10 Essential (primary) hypertension: Secondary | ICD-10-CM | POA: Diagnosis not present

## 2019-06-25 DIAGNOSIS — I2510 Atherosclerotic heart disease of native coronary artery without angina pectoris: Secondary | ICD-10-CM

## 2019-06-25 DIAGNOSIS — L84 Corns and callosities: Secondary | ICD-10-CM | POA: Diagnosis not present

## 2019-06-25 DIAGNOSIS — Z23 Encounter for immunization: Secondary | ICD-10-CM | POA: Diagnosis not present

## 2019-06-25 DIAGNOSIS — I4819 Other persistent atrial fibrillation: Secondary | ICD-10-CM | POA: Diagnosis not present

## 2019-06-25 DIAGNOSIS — F419 Anxiety disorder, unspecified: Secondary | ICD-10-CM

## 2019-06-25 DIAGNOSIS — E785 Hyperlipidemia, unspecified: Secondary | ICD-10-CM

## 2019-06-25 DIAGNOSIS — M1A9XX0 Chronic gout, unspecified, without tophus (tophi): Secondary | ICD-10-CM

## 2019-06-25 LAB — POCT GLYCOSYLATED HEMOGLOBIN (HGB A1C): Hemoglobin A1C: 8 % — AB (ref 4.0–5.6)

## 2019-06-25 MED ORDER — LORAZEPAM 0.5 MG PO TABS: 0.5000 mg | ORAL_TABLET | Freq: Three times a day (TID) | ORAL | 5 refills | Status: DC | PRN

## 2019-06-25 MED ORDER — SITAGLIPTIN PHOSPHATE 100 MG PO TABS: 100.0000 mg | ORAL_TABLET | Freq: Every day | ORAL | 1 refills | Status: DC

## 2019-06-25 NOTE — Telephone Encounter (Signed)
I refilled his Lorazepam for 6 months.  His med list includes Protonix.  He had requested Aciphex- but if on Protonix should not also be on Aciphex.

## 2019-06-25 NOTE — Progress Notes (Signed)
Subjective:     Patient ID: Adam [name], male   DOB: 02/07/1951, 68 y.o.   MRN: [id number]  HPI Patient seen for medical follow-up.  He has history of CAD, atrial fibrillation, hypertension, COPD, type 2 diabetes, GERD, osteoarthritis, dyslipidemia  His A1c's have generally been very well controlled.  Unfortunately, is up to 8.0% today and he is surprised that this has jumped up.  Not monitoring blood sugars regularly.  He is on Jardiance, metformin, and Toujeo 60 units once daily.  No recent hypoglycemia.  Not a candidate for GLP-1 medications with prior history of pancreatitis.  This was many years ago and related to alcohol binge.  None since then.  He has callused area plantar aspect right foot.  He states [facility] of plantar wart multiple times and had this frozen.  Now has callused area that is painful to walk on  Past Medical History:  Diagnosis Date  . Allergy   . Anxiety    history of PTSD following CABG  . Ascending aortic aneurysm ([place]) 01/31/2018   43 x 42 mm, pt unaware  . Asthma   . Cardiomegaly 10/17/2017  . Colitis- colonoscopy 2014 07/13/2015  . COPD (chronic obstructive pulmonary disease) ([place])   . Coronary artery disease    x 6  . Depression   . Diabetes mellitus without complication ([place])   . Family history of polyps in the colon   . Finger dislocation    Left pinkie  . GERD (gastroesophageal reflux disease)   . Gout   . H/O atrial fibrillation without current medication    following CABG with no documented episodes since then.  . Heart palpitations   . Hx of adenomatous colonic polyps 08/12/2010  . Hyperlipidemia   . Hypertension   . OA (osteoarthritis)   . OSA (obstructive sleep apnea)    Mild, has not received CPAP yet  . Prediabetes   . RLS (restless legs syndrome)   . 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 ANKLE FRACTURE SURGERY Right 1991   . APPENDECTOMY    . ATRIAL FIBRILLATION ABLATION N/A 03/31/2018   Procedure: ATRIAL FIBRILLATION ABLATION;  Surgeon: [name], MD;  Location: [facility];  Service: Cardiovascular;  Laterality: N/A;  . COLONOSCOPY W/ BIOPSIES  2017   x7  . CORONARY ANGIOPLASTY WITH STENT PLACEMENT    . CORONARY ARTERY BYPASS GRAFT    . FINGER SURGERY  04/2018   Small finger left hand  . TEE WITHOUT CARDIOVERSION N/A 03/30/2018   Procedure: TRANSESOPHAGEAL ECHOCARDIOGRAM (TEE);  Surgeon: [name], MD;  Location: [facility];  Service: Cardiovascular;  Laterality: N/A;  . TOTAL HIP ARTHROPLASTY Left   . TOTAL HIP ARTHROPLASTY Right 09/12/2018   Procedure: TOTAL HIP ARTHROPLASTY ANTERIOR APPROACH;  Surgeon: [name], MD;  Location: [facility];  Service: Orthopedics;  Laterality: Right;  70 mins    reports that he quit smoking about 41 years ago. His smoking use included cigarettes. He has a 7.00 pack-year smoking history. He has never used smokeless tobacco. He reports current alcohol use. He reports that he does not use drugs. family history includes Colon cancer in his mother; Heart disease in his paternal grandfather and paternal grandmother. Allergies  Allergen Reactions  . Amoxicillin Rash and Other (See Comments)    * SEVERE RASH IN GROIN AREA Has patient had a PCN reaction causing  immediate rash, facial/tongue/throat swelling, SOB or lightheadedness with hypotension: no Has patient had a PCN reaction causing severe rash involving mucus membranes or skin necrosis: no Has patient had a PCN reaction that required hospitalization: no Has patient had a PCN reaction occurring within the last 10 years: yes If all of the above answers are "NO", then may proceed with Cephalosporin use.   . Augmentin [Amoxicillin-Pot Clavulanate] Rash and Other (See Comments)    * SEVERE RASH IN GROIN AREA  . Azithromycin Rash and Other (See Comments)    * SEVERE RASH IN GROIN AREA  .  Clindamycin/Lincomycin Rash  . Keflex [Cephalexin] Rash     Review of Systems  Constitutional: Negative for fatigue and unexpected weight change.  Eyes: Negative for visual disturbance.  Respiratory: Negative for cough, chest tightness and shortness of breath.   Cardiovascular: Negative for chest pain, palpitations and leg swelling.  Gastrointestinal: Negative for abdominal pain.  Endocrine: Negative for polydipsia and polyuria.  Neurological: Negative for dizziness, syncope, weakness, light-headedness and headaches.  Psychiatric/Behavioral: Negative for confusion.       Objective:   Physical Exam Constitutional:      Appearance: He is well-developed.  HENT:     Right Ear: External ear normal.     Left Ear: External ear normal.  Eyes:     Pupils: Pupils are equal, round, and reactive to light.  Neck:     Musculoskeletal: Neck supple.     Thyroid: No thyromegaly.  Cardiovascular:     Rate and Rhythm: Normal rate and regular rhythm.  Pulmonary:     Effort: Pulmonary effort is normal. No respiratory distress.     Breath sounds: Normal breath sounds. No wheezing or rales.  Skin:    Comments: He has thick callused area mid plantar aspect of right foot.  No ulceration. He has good sensation with monofilament testing in both feet  Neurological:     Mental Status: He is alert and oriented to person, place, and time.        Assessment:     #1 type 2 diabetes.  Worsening control with A1c 8.0%  #2 hypertension stable and at goal  #3 history of chronic atrial fibrillation maintained on Xarelto  #4 callused area right foot  #5 dyslipidemia.  Recently placed on Vascepa per cardiology and he has follow-up pending labs    Plan:     -We recommended trimming thickened callused area right foot.  We discussed risk and benefits including risk of bleeding and low risk of infection.  Patient consented.  Using #15 blade trimmed hardened callused area away.  No underlying  ulcer  -Discussed options for his diabetes.  Since he is on fairly high-dose insulin and we discussed possible addition of Januvia 100 mg once daily.  Office follow-up in 3 months to reassess  -Flu vaccine given  [name] MD [facility]  -

## 2019-06-26 ENCOUNTER — Other Ambulatory Visit: Payer: Self-pay

## 2019-06-26 DIAGNOSIS — M5416 Radiculopathy, lumbar region: Secondary | ICD-10-CM | POA: Diagnosis not present

## 2019-06-26 MED ORDER — RABEPRAZOLE SODIUM 20 MG PO TBEC: 20.0000 mg | DELAYED_RELEASE_TABLET | Freq: Every day | ORAL | 1 refills | Status: DC

## 2019-06-26 NOTE — Progress Notes (Signed)
Virtual Visit via Telephone Note   This visit type was conducted due to national recommendations for restrictions regarding the COVID-19 Pandemic (e.g. social distancing) in an effort to limit this patient's exposure and mitigate transmission in our community.  Due to his co-morbid illnesses, this patient is at least at moderate risk for complications without adequate follow up.  This format is felt to be most appropriate for this patient at this time.  The patient did not have access to video technology/had technical difficulties with video requiring transitioning to audio format only (telephone).  All issues noted in this document were discussed and addressed.  No physical exam could be performed with this format.  Please refer to the patient's chart for his  consent to telehealth for [facility].  Evaluation Performed:  Follow-up visit  This visit type was conducted due to national recommendations for restrictions regarding the COVID-19 Pandemic (e.g. social distancing).  This format is felt to be most appropriate for this patient at this time.  All issues noted in this document were discussed and addressed.  No physical exam was performed (except for noted visual exam findings with Video Visits).  Please refer to the patient's chart (MyChart message for video visits and phone note for telephone visits) for the patient's consent to telehealth for [facility].  Date:  06/27/2019   ID:  Adam [name], DOB 12-Apr-1951, MRN [id number]  Patient Location:  Home  Provider location:   [place]  PCP:  [name], MD  Cardiologist:  [name], MD  Electrophysiologist:  [name], MD      Virtual Visit via Telephone Note   This visit type was conducted due to national recommendations for restrictions regarding the COVID-19 Pandemic (e.g. social distancing) in an effort to limit this patient's exposure and mitigate transmission in our community.  Due to his co-morbid  illnesses, this patient is at least at moderate risk for complications without adequate follow up.  This format is felt to be most appropriate for this patient at this time.  The patient did not have access to video technology/had technical difficulties with video requiring transitioning to audio format only (telephone).  All issues noted in this document were discussed and addressed.  No physical exam could be performed with this format.  Please refer to the patient's chart for his  consent to telehealth for [facility].  Evaluation Performed:  Follow-up visit  This visit type was conducted due to national recommendations for restrictions regarding the COVID-19 Pandemic (e.g. social distancing).  This format is felt to be most appropriate for this patient at this time.  All issues noted in this document were discussed and addressed.  No physical exam was performed (except for noted visual exam findings with Video Visits).  Please refer to the patient's chart (MyChart message for video visits and phone note for telephone visits) for the patient's consent to telehealth for [facility].  Date:  06/27/2019   ID:  Adam [name], DOB November 04, 1950, MRN [id number]  Patient Location:  Home   Provider location:   [place]  PCP:  [name], MD  Cardiologist:  Fransico Him, MD Electrophysiologist:  [name], MD   Chief Complaint:  CAD, HTN, HLD, PAF and OSA  History of Present Illness:    Adam [name] is a 68 y.o. male who presents via audio/video conferencing for a telehealth visit today.    This ia  68yo male who was a prior pt of Dr.  Wynonia Lawman with a hx of ASCAD s/p CABG in 2011done in [place], HTN, HLD,DM,Atrial fibrillation followed in afib clinic, and OSA. He underwent recent catheter ablation for atrial fibrillation by Dr. Rayann Heman 03/31/18.He is onTikosyn and Xarelto. He had an echo in January of lastyear that showed normal LVEF and no significant valvular abnormalities.  Stress test in 2013 showed no ischemia.  He also has a hx of aortic aneursym with last Chest CT showing an ascending aortic aneurysm measuring 43 x 69mm in April 2019.  He is a former smoker but quit 40 years ago.  Repeat chest CT 02/2019 showed stable ascending aortic aneursym at 81mm.  He was last seen in afib clinic 02/2019 due to recurrent afib despite Tikosyn.  He was continuing to drink 3-4 ETOH drinks daily and has been intolerant to CPAP for his OSA.  He was encouraged to followup with Dr. [name] Parker for possible oral appliance for his OSA.  He was encouraged to cut back on ETOH. He was set up for [facility] and if that did not work then consider repeat ablation.   He is here today for followup and is doing well.  He denies any chest pain or pressure, SOB, DOE, PND, orthopnea, LE edema, dizziness, palpitations or syncope. He is compliant with his meds and is tolerating meds with no SE.    The patient does not have symptoms concerning for COVID-19 infection (fever, chills, cough, or new shortness of breath).    Prior CV studies:   The following studies were reviewed today:  none  Past Medical History:  Diagnosis Date  . Allergy   . Anxiety    history of PTSD following CABG  . Ascending aortic aneurysm ([place]) 01/31/2018   43 x 42 mm, pt unaware  . Asthma   . Cardiomegaly 10/17/2017  . Colitis- colonoscopy 2014 07/13/2015  . COPD (chronic obstructive pulmonary disease) ([place])   . Coronary artery disease    x 6  . Depression   . Diabetes mellitus without complication ([place])   . Family history of polyps in the colon   . Finger dislocation    Left pinkie  . GERD (gastroesophageal reflux disease)   . Gout   . H/O atrial fibrillation without current medication    following CABG with no documented episodes since then.  . Heart palpitations   . Hx of adenomatous colonic polyps 08/12/2010  . Hyperlipidemia   . Hypertension   . OA (osteoarthritis)   . OSA (obstructive sleep apnea)    Mild, has not  received CPAP yet  . Prediabetes   . RLS (restless legs syndrome)   . 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 ANKLE FRACTURE SURGERY Right 1991  . APPENDECTOMY    . ATRIAL FIBRILLATION ABLATION N/A 03/31/2018   Procedure: ATRIAL FIBRILLATION ABLATION;  Surgeon: [name], MD;  Location: [facility];  Service: Cardiovascular;  Laterality: N/A;  . COLONOSCOPY W/ BIOPSIES  2017   x7  . CORONARY ANGIOPLASTY WITH STENT PLACEMENT    . CORONARY ARTERY BYPASS GRAFT    . FINGER SURGERY  04/2018   Small finger left hand  . TEE WITHOUT CARDIOVERSION N/A 03/30/2018   Procedure: TRANSESOPHAGEAL ECHOCARDIOGRAM (TEE);  Surgeon: [name], MD;  Location: [facility];  Service: Cardiovascular;  Laterality: N/A;  . TOTAL HIP ARTHROPLASTY Left   . TOTAL HIP ARTHROPLASTY Right 09/12/2018   Procedure: TOTAL HIP ARTHROPLASTY ANTERIOR APPROACH;  Surgeon: [name], MD;  [facility];  Service: Orthopedics;  Laterality: Right;  70 mins     Current Meds  Medication Sig  . allopurinol (ZYLOPRIM) 300 MG tablet TAKE 1 TABLET BY MOUTH EVERY DAY  . Coenzyme Q10 (CO Q-10) 100 MG CAPS Take 100 mg by mouth daily.   . diphenhydrAMINE HCl, Sleep, (ZZZQUIL) 25 MG CAPS Take 25 mg by mouth at bedtime as needed (for sleep).  . dofetilide (TIKOSYN) 125 MCG capsule TAKE 1 CAPSULE BY MOUTH TWICE DAILY  . econazole nitrate 1 % cream Apply 1 application topically every other day.   . fenofibrate 160 MG tablet TAKE 1 TABLET(160 MG) BY MOUTH DAILY  . fluticasone (FLONASE) 50 MCG/ACT nasal spray Place 1 spray into both nostrils daily.   . Fluticasone-Umeclidin-Vilant (TRELEGY ELLIPTA) 100-62.5-25 MCG/INH AEPB Inhale 1 puff into the lungs daily.  Marland Kitchen gabapentin (NEURONTIN) 300 MG capsule Take two to three tablets at night as needed for restless leg symptoms  . glucose blood (ONETOUCH VERIO) test strip Test once daily.  Dx E11.9  .  hydrochlorothiazide (HYDRODIURIL) 25 MG tablet Take 1 tablet (25 mg total) by mouth daily.  Vanessa Kick Ethyl (VASCEPA) 1 g CAPS Take 2 capsules (2 g total) by mouth 2 (two) times daily.  Vanessa Kick Ethyl (VASCEPA) 1 g CAPS Vascepa 1 gram capsule  . indomethacin (INDOCIN) 25 MG capsule TK 1 C PO BID  . JARDIANCE 25 MG TABS tablet TAKE 1 TABLET BY MOUTH DAILY  . LORazepam (ATIVAN) 0.5 MG tablet Take 1 tablet (0.5 mg total) by mouth every 8 (eight) hours as needed for anxiety.  Marland Kitchen losartan (COZAAR) 100 MG tablet TAKE 1 TABLET(100 MG) BY MOUTH DAILY  . metFORMIN (GLUCOPHAGE) 1000 MG tablet TAKE 1 TABLET BY MOUTH TWICE DAILY WITH A MEAL  . methocarbamol (ROBAXIN) 500 MG tablet TK 1 T PO Q 6 H PRF SPASMS  . metoprolol tartrate (LOPRESSOR) 100 MG tablet Take 1 tablet (100 mg total) by mouth 2 (two) times daily.  . montelukast (SINGULAIR) 10 MG tablet TAKE 1 TABLET BY MOUTH AT BEDTIME  . Olopatadine HCl 0.2 % SOLN Apply 1 drop to eye daily as needed. Apply to both  eyes  . potassium chloride SA (K-DUR) 20 MEQ tablet Take  2 tablets in the AM and 1 tablet in the PM  . RABEprazole (ACIPHEX) 20 MG tablet Take 1 tablet (20 mg total) by mouth daily.  . simvastatin (ZOCOR) 80 MG tablet TAKE 1 TABLET BY MOUTH EVERY DAY  . sitaGLIPtin (JANUVIA) 100 MG tablet Take 1 tablet (100 mg total) by mouth daily.  Marland Kitchen SPIRIVA HANDIHALER 18 MCG inhalation capsule INHALE CONTENTS OF 1 CAPSULE ONCE DAILY USING HANDIHALER  . TOUJEO SOLOSTAR 300 UNIT/ML SOPN INJECT 60 UNITS AS DIRECTED DAILY  . XARELTO 20 MG TABS tablet TAKE 1 TABLET BY MOUTH DAILY WITH SUPPER     Allergies:   Amoxicillin, Augmentin [amoxicillin-pot clavulanate], Azithromycin, Clindamycin/lincomycin, and Keflex [cephalexin]   Social History   Tobacco Use  . Smoking status: Former Smoker    Packs/day: 1.00    Years: 7.00    Pack years: 7.00    Types: Cigarettes    Quit date: 10/04/1977    Years since quitting: 41.7  . Smokeless tobacco: Never Used  .  Tobacco comment: never smoked over 1 pack   Substance Use Topics  . Alcohol use: Yes    Comment: stopped wine and now drinks 3 to 4 scotches   .  Drug use: No    Comment: Smoked Marijuana back in 80s     Family Hx: The patient's family history includes Colon cancer in his mother; Heart disease in his paternal grandfather and paternal grandmother.  ROS:   Please see the history of present illness.     All other systems reviewed and are negative.   Labs/Other Tests and Data Reviewed:    Recent Labs: 12/29/2018: TSH 1.32 02/15/2019: Hemoglobin 12.4; Magnesium 2.1; Platelets 178 05/14/2019: BUN 9; Creatinine, Ser 0.84; Potassium 4.0; Sodium 136   Recent Lipid Panel Lab Results  Component Value Date/Time   CHOL 192 05/14/2019 08:45 AM   TRIG 569 (HH) 05/14/2019 08:45 AM   HDL 31 (L) 05/14/2019 08:45 AM   CHOLHDL 6.2 (H) 05/14/2019 08:45 AM   CHOLHDL 5 06/23/2018 11:18 AM   LDLCALC Comment 05/14/2019 08:45 AM   LDLDIRECT 124.0 06/23/2018 11:18 AM    Wt Readings from Last 3 Encounters:  06/27/19 261 lb (118.4 kg)  06/25/19 261 lb 9.6 oz (118.7 kg)  05/21/19 264 lb 6.4 oz (119.9 kg)     Objective:    Vital Signs:  Ht 5\' 10"  (1.778 m)   Wt 261 lb (118.4 kg)   BMI 37.45 kg/m     ASSESSMENT & PLAN:    1.  OSA - he is intolerant to PAP therapy.  His AHI was 9.9/hr on his home sleep study and therefore dose not qualify for the hypoglossal nerve stimulator. Suspect that his OSA is contributing some to his PAF. I will refer him to Dr. Toy Cookey for evaluation for oral device.   2.  HTN - Bp at PCP was 116/54mmHg.   Continue on Lopressor 100mg  BID, Losartan 100mg  daily and HCTZ 25mg  daily   3.  Obesity - I have encouraged him to get into a routine exercise program and cut back on carbs and portions.   4.  PAF - this is followed in afib clinic.  He was in NSR 02/15/2019.  He will continue on Tikosyn 125mcg BID, Lopressor 100mg  BID and Xarelto 20mg  daily.  Creatinine was normal at  0.84 last month.    5.  ASCAD -  s/p CABG in 2011 done in [place],  He has no anginal sx.  Continue on BB, statin.  No ASA due to DOAC.  6.  Hyperlipidemia - His TAGs have been very elevated and LDL has not been able to be calculated.  He is now on Vascepa 2gm BID and Fenofibrate 160mg  daily.  I will repeat FLP and ALT and if not improved then refer to lipid clinic with Dr. Debara Pickett.  He is on simvastatin 80mg  daily and I discussed with him that recommendations are not to go higher than 40mg  daily now so I have recommended that we change him to Crestor 40mg  daily.  Repeat FLP and ALT in 6 weeks.  7.  Mild ascending aortic aneurysm - 53mm and stable by recent chest CTA - repeat CT in 1 year for stability.  Needs good BP control.  Continue Losartan and statin.  Instructed not to partake in any upper body weightlifting.   COVID-19 Education: The signs and symptoms of COVID-19 were discussed with the patient and how to seek care for testing (follow up with PCP or arrange E-visit).  The importance of social distancing was discussed today.  Patient Risk:   After full review of this patient's clinical status, I feel that they are at least moderate risk at this time.  Time:  Today, I have spent 20 minutes directly with the patient on telemedicine discussing medical problems including CAD, OSA< HTN, HLD, PAF.  We also reviewed the symptoms of COVID 19 and the ways to protect against contracting the virus with telehealth technology.  I spent an additional 5 minutes reviewing patient's chart including labs.  Medication Adjustments/Labs and Tests Ordered: Current medicines are reviewed at length with the patient today.  Concerns regarding medicines are outlined above.  Tests Ordered: No orders of the defined types were placed in this encounter.  Medication Changes: No orders of the defined types were placed in this encounter.   Disposition:  Follow up in 1 year(s)  Signed, Fransico Him, MD  06/27/2019 8:57  AM    [facility]

## 2019-06-27 ENCOUNTER — Telehealth (INDEPENDENT_AMBULATORY_CARE_PROVIDER_SITE_OTHER): Payer: Medicare Other | Admitting: Cardiology

## 2019-06-27 ENCOUNTER — Other Ambulatory Visit: Payer: Self-pay

## 2019-06-27 ENCOUNTER — Encounter: Payer: Self-pay | Admitting: Cardiology

## 2019-06-27 VITALS — Ht 70.0 in | Wt 261.0 lb

## 2019-06-27 DIAGNOSIS — I712 Thoracic aortic aneurysm, without rupture, unspecified: Secondary | ICD-10-CM

## 2019-06-27 DIAGNOSIS — I4819 Other persistent atrial fibrillation: Secondary | ICD-10-CM | POA: Diagnosis not present

## 2019-06-27 DIAGNOSIS — G4733 Obstructive sleep apnea (adult) (pediatric): Secondary | ICD-10-CM | POA: Diagnosis not present

## 2019-06-27 DIAGNOSIS — E669 Obesity, unspecified: Secondary | ICD-10-CM | POA: Diagnosis not present

## 2019-06-27 DIAGNOSIS — E785 Hyperlipidemia, unspecified: Secondary | ICD-10-CM

## 2019-06-27 DIAGNOSIS — I10 Essential (primary) hypertension: Secondary | ICD-10-CM

## 2019-06-27 DIAGNOSIS — I2510 Atherosclerotic heart disease of native coronary artery without angina pectoris: Secondary | ICD-10-CM

## 2019-06-27 MED ORDER — ROSUVASTATIN CALCIUM 40 MG PO TABS: 40.0000 mg | ORAL_TABLET | Freq: Every day | ORAL | 3 refills | Status: DC

## 2019-06-27 NOTE — Patient Instructions (Signed)
Medication Instructions:  Your physician has recommended you make the following change in your medication:  1.  STOP Simvastatin 2.  START Crestor 40 mg taking 1 tablet daily  If you need a refill on your cardiac medications before your next appointment, please call your pharmacy.   Lab work: 08/08/2019 COME TO THE OFFICE FOR FASTING LAB WORK AT 8:30.  WE HAVE CANCELLED THE LAB APPT YOU HAD IN October.  If you have labs (blood work) drawn today and your tests are completely normal, you will receive your results only by: Marland Kitchen MyChart Message (if you have MyChart) OR . A paper copy in the mail If you have any lab test that is abnormal or we need to change your treatment, we will call you to review the results.  Testing/Procedures: You will need a Non-Cardiac CT Angiography (CTA), in 1 YEAR.  Someone will call you to arrange this.  This a special type of CT scan that uses a computer to produce multi-dimensional views of major blood vessels throughout the body. In CT angiography, a contrast material is injected through an IV to help visualize the blood vessels    You have been referred to Dr. Toy Cookey, DDS for oral device for Obstructive Sleep Apnea.  They will contact you with an appointment.  Follow-Up: At [facility], you and your health needs are our priority.  As part of our continuing mission to provide you with exceptional heart care, we have created designated Provider Care Teams.  These Care Teams include your primary Cardiologist (physician) and Advanced Practice Providers (APPs -  Physician Assistants and Nurse Practitioners) who all work together to provide you with the care you need, when you need it. You will need a follow up appointment in 12 months.  Please call our office 2 months in advance to schedule this appointment.  You may see W Tollie Eth, MD or one of the following Advanced Practice Providers on your designated Care Team:   [place], PA-C Melina Copa,  PA-C . Ermalinda Barrios, PA-C  Any Other Special Instructions Will Be Listed Below (If Applicable).

## 2019-06-29 NOTE — Addendum Note (Signed)
Addended by: Gaetano Net on: 06/29/2019 08:30 AM   Modules accepted: Orders

## 2019-07-02 ENCOUNTER — Other Ambulatory Visit: Payer: Self-pay

## 2019-07-02 ENCOUNTER — Encounter: Payer: Self-pay | Admitting: Internal Medicine

## 2019-07-02 ENCOUNTER — Ambulatory Visit: Payer: Medicare Other | Admitting: Internal Medicine

## 2019-07-02 VITALS — BP 106/70 | HR 87 | Ht 70.0 in | Wt 254.0 lb

## 2019-07-02 DIAGNOSIS — I2510 Atherosclerotic heart disease of native coronary artery without angina pectoris: Secondary | ICD-10-CM

## 2019-07-02 DIAGNOSIS — I10 Essential (primary) hypertension: Secondary | ICD-10-CM | POA: Diagnosis not present

## 2019-07-02 DIAGNOSIS — G4733 Obstructive sleep apnea (adult) (pediatric): Secondary | ICD-10-CM

## 2019-07-02 DIAGNOSIS — I4819 Other persistent atrial fibrillation: Secondary | ICD-10-CM

## 2019-07-02 HISTORY — PX: OTHER SURGICAL HISTORY: SHX169

## 2019-07-02 MED ORDER — METOPROLOL TARTRATE 50 MG PO TABS: 100.0000 mg | ORAL_TABLET | Freq: Two times a day (BID) | ORAL | 3 refills | Status: DC

## 2019-07-02 NOTE — Progress Notes (Signed)
PCP: [name], MD   Primary EP: [name] is a 68 y.o. male who presents today for routine electrophysiology followup.  Since last being seen in our clinic, the patient reports doing reasonably well.  He has fatigue and decreased exercise tolerance.  Though there is concern for afib as the cause, he recently wore telemetry during which he did not have afib detected.  He has occasional palpitations of unclear etiology. Today, he denies symptoms of chest pain, shortness of breath,  lower extremity edema, dizziness, presyncope, or syncope.  The patient is otherwise without complaint today.   Past Medical History:  Diagnosis Date  . Allergy   . Anxiety    history of PTSD following CABG  . Ascending aortic aneurysm ([place]) 01/31/2018   43 x 42 mm, pt unaware  . Asthma   . Cardiomegaly 10/17/2017  . Colitis- colonoscopy 2014 07/13/2015  . COPD (chronic obstructive pulmonary disease) ([place])   . Coronary artery disease    x 6  . Depression   . Diabetes mellitus without complication ([place])   . Family history of polyps in the colon   . Finger dislocation    Left pinkie  . GERD (gastroesophageal reflux disease)   . Gout   . H/O atrial fibrillation without current medication    following CABG with no documented episodes since then.  . Heart palpitations   . Hx of adenomatous colonic polyps 08/12/2010  . Hyperlipidemia   . Hypertension   . OA (osteoarthritis)   . OSA (obstructive sleep apnea)    Mild, has not received CPAP yet  . Prediabetes   . RLS (restless legs syndrome)   . 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 ANKLE FRACTURE SURGERY Right 1991  . APPENDECTOMY    . ATRIAL FIBRILLATION ABLATION N/A 03/31/2018   Procedure: ATRIAL FIBRILLATION ABLATION;  Surgeon: [name], MD;  Location: [facility];  Service: Cardiovascular;  Laterality: N/A;  . COLONOSCOPY W/ BIOPSIES  2017   x7   . CORONARY ANGIOPLASTY WITH STENT PLACEMENT    . CORONARY ARTERY BYPASS GRAFT    . FINGER SURGERY  04/2018   Small finger left hand  . TEE WITHOUT CARDIOVERSION N/A 03/30/2018   Procedure: TRANSESOPHAGEAL ECHOCARDIOGRAM (TEE);  Surgeon: [name], MD;  Location: [facility];  Service: Cardiovascular;  Laterality: N/A;  . TOTAL HIP ARTHROPLASTY Left   . TOTAL HIP ARTHROPLASTY Right 09/12/2018   Procedure: TOTAL HIP ARTHROPLASTY ANTERIOR APPROACH;  Surgeon: [name], MD;  Location: [facility];  Service: Orthopedics;  Laterality: Right;  70 mins    ROS- all systems are reviewed and negatives except as per HPI above  Current Outpatient Medications  Medication Sig Dispense Refill  . allopurinol (ZYLOPRIM) 300 MG tablet TAKE 1 TABLET BY MOUTH EVERY DAY 90 tablet 1  . Coenzyme Q10 (CO Q-10) 100 MG CAPS Take 100 mg by mouth daily.     . diphenhydrAMINE HCl, Sleep, (ZZZQUIL) 25 MG CAPS Take 25 mg by mouth at bedtime as needed (for sleep).    . dofetilide (TIKOSYN) 125 MCG capsule TAKE 1 CAPSULE BY MOUTH TWICE DAILY 60 capsule 2  . econazole nitrate 1 % cream Apply 1 application topically every other day.     . fenofibrate 160 MG tablet TAKE 1 TABLET(160 MG) BY MOUTH DAILY 90 tablet 1  . fluticasone (FLONASE) 50 MCG/ACT nasal spray Place  1 spray into both nostrils daily.     . Fluticasone-Umeclidin-Vilant (TRELEGY ELLIPTA) 100-62.5-25 MCG/INH AEPB Inhale 1 puff into the lungs daily. 2 each 0  . gabapentin (NEURONTIN) 300 MG capsule Take two to three tablets at night as needed for restless leg symptoms 270 capsule 3  . glucose blood (ONETOUCH VERIO) test strip Test once daily.  Dx E11.9 100 each 3  . hydrochlorothiazide (HYDRODIURIL) 25 MG tablet Take 1 tablet (25 mg total) by mouth daily. 90 tablet 3  . Icosapent Ethyl (VASCEPA) 1 g CAPS Take 2 capsules by mouth 2 (two) times daily.     . indomethacin (INDOCIN) 25 MG capsule TK 1 C PO BID    . JARDIANCE 25 MG TABS tablet TAKE 1 TABLET BY  MOUTH DAILY 90 tablet 0  . LORazepam (ATIVAN) 0.5 MG tablet Take 1 tablet (0.5 mg total) by mouth every 8 (eight) hours as needed for anxiety. 90 tablet 5  . losartan (COZAAR) 100 MG tablet TAKE 1 TABLET(100 MG) BY MOUTH DAILY 90 tablet 0  . metFORMIN (GLUCOPHAGE) 1000 MG tablet TAKE 1 TABLET BY MOUTH TWICE DAILY WITH A MEAL 180 tablet 1  . methocarbamol (ROBAXIN) 500 MG tablet TK 1 T PO Q 6 H PRF SPASMS    . metoprolol tartrate (LOPRESSOR) 100 MG tablet Take 1 tablet (100 mg total) by mouth 2 (two) times daily. 180 tablet 1  . montelukast (SINGULAIR) 10 MG tablet TAKE 1 TABLET BY MOUTH AT BEDTIME 90 tablet 1  . Olopatadine HCl 0.2 % SOLN Apply 1 drop to eye daily as needed. Apply to both  eyes    . potassium chloride SA (K-DUR) 20 MEQ tablet Take  2 tablets in the AM and 1 tablet in the PM 60 tablet 8  . RABEprazole (ACIPHEX) 20 MG tablet Take 1 tablet (20 mg total) by mouth daily. 90 tablet 1  . rosuvastatin (CRESTOR) 40 MG tablet Take 1 tablet (40 mg total) by mouth daily. 90 tablet 3  . sitaGLIPtin (JANUVIA) 100 MG tablet Take 1 tablet (100 mg total) by mouth daily. 90 tablet 1  . TOUJEO SOLOSTAR 300 UNIT/ML SOPN INJECT 60 UNITS AS DIRECTED DAILY 33 mL 1  . XARELTO 20 MG TABS tablet TAKE 1 TABLET BY MOUTH DAILY WITH SUPPER 30 tablet 6   No current facility-administered medications for this visit.     Physical Exam: Vitals:   07/02/19 1129  BP: 106/70  Pulse: 87  SpO2: 96%  Weight: 254 lb (115.2 kg)  Height: 5\' 10"  (1.778 m)    GEN- The patient is well appearing, alert and oriented x 3 today.   Head- normocephalic, atraumatic Eyes-  Sclera clear, conjunctiva pink Ears- hearing intact Oropharynx- clear Lungs-  normal work of breathing Heart- Regular rate and rhythm  GI- soft, NT, ND, + BS Extremities- no clubbing, cyanosis, or edema  Wt Readings from Last 3 Encounters:  07/02/19 254 lb (115.2 kg)  06/27/19 261 lb (118.4 kg)  06/25/19 261 lb 9.6 oz (118.7 kg)    EKG  tracing ordered today is personally reviewed and shows sinus rhythm 87 bpm, PR 166 msec, QRS 76 smec, Qtc 481 msec  Assessment and Plan:  1. Persistent afib Remains in sinus rhythm On xarelto for chads2vasc score of 4 Reduce metoprolol to 50mg  BID Recent ZIo monitor is reviewed He and I have discussed long term monitoring at length today.  There is concern that he is having undetected afib.  This could be the  cause of his palpitations and fatigue.  I have therefore advised implantation of an implantable loop recorder for long term monitoring of AF post ablation and to further evaluate his palpitations.  Risks and benefits to this procedure were discussed with the patient who wishes to proceed.  2. HTN Stable No change required today  3. OSA Followed by Dr Radford Pax Does not comply with cpap He has been referred for a orthodontic device  4. Morbid obesity Body mass index is 36.45 kg/m. Lifestyle modification is encouraged  5. CAD No ischemic symptmos   [name] MD, [facility] 07/02/2019 12:06 PM      PROCEDURES:   1. Implantable loop recorder implantation      DESCRIPTION OF PROCEDURE:  Informed written consent was obtained.  The patient required no sedation for the procedure today.  The patients left chest was prepped and draped. Mapping over the patient's chest was performed to identify the appropriate ILR site.  This area was found to be the left parasternal region over the 3rd-4th intercostal space.  The skin overlying this region was infiltrated with lidocaine for local analgesia.  A 0.5-cm incision was made at the implant site.  A subcutaneous ILR pocket was fashioned using a combination of sharp and blunt dissection.  A Medtronic Reveal Linq model [id number] (SN [id number])implantable loop recorder was then placed into the pocket R waves were very prominent and measured > 0.2 mV. EBL<1 ml.  Steri- Strips and a sterile dressing were then applied.  There were no early apparent  complications.     CONCLUSIONS:   1. Successful implantation of a Medtronic Reveal LINQ implantable loop recorder for afib management post ablation and to further evaluation palpitations  2. No early apparent complications.   [name] MD, [facility] 07/02/2019 3:03 PM

## 2019-07-02 NOTE — Patient Instructions (Addendum)
Medication Instructions:  Your physician has recommended you make the following change in your medication:   Decrease your Metoprolol to 50mg , half tablet of your current dose, two times per day  Labwork: None ordered.  Testing/Procedures: Your physician implanted a Loop recorder today.  Follow-Up: Your physician recommends that you schedule a follow-up appointment in:   October 8 @ 4:30pm  Dec 28 @ 2:25 with Dr. Rayann Heman  Any Other Special Instructions Will Be Listed Below (If Applicable).  Tissue Adhesive Wound Care Some cuts and wounds can be closed with skin glue (tissue adhesive). Skin glue holds the skin together and helps your wound heal faster. Skin glue goes away on its own as your wound gets better. Follow these instructions at home:  Wound care    Showers are allowed 24 hours after treatment. Do not soak the wound in water. Do not take baths, swim, or use hot tubs. Do not use soaps or creams on your wound.  If a bandage (dressing) was put on the wound: ? Wash your hands with soap and water before you change your bandage. ? Remove your bandage in 24 hours. ? Leave steri strips in place. It will fall off on its own after 7-10 days.   Do not scratch, rub, or pick at the skin glue.  Do not put tape over the skin glue. The skin glue could come off when you take the tape off.  Protect the wound from another injury.  Protect the wound from sun and tanning beds. General instructions  Take over-the-counter and prescription medicines only as told by your doctor.  Keep all follow-up visits as told by your doctor. This is important. Get help right away if:  Your wound is red, puffy (swollen), hot, or tender.  You get a rash after the glue is put on.  You have more pain in the wound.  You have a red streak going away from the wound.  You have yellowish-white fluid (pus) coming from the wound.  You have more bleeding.  You have a fever.  You have chills and  you start to shake.  You notice a bad smell coming from the wound.  Your wound or skin glue breaks open. This information is not intended to replace advice given to you by your health care provider. Make sure you discuss any questions you have with your health care provider. Document Released: 06/29/2008 Document Revised: 09/02/2017 Document Reviewed: 08/13/2016 Elsevier Patient Education  2020 [employer].     If you need a refill on your cardiac medications before your next appointment, please call your pharmacy.

## 2019-07-09 ENCOUNTER — Telehealth: Payer: Self-pay | Admitting: Family Medicine

## 2019-07-11 MED ORDER — DOFETILIDE 125 MCG PO CAPS: 125.0000 ug | ORAL_CAPSULE | Freq: Two times a day (BID) | ORAL | 2 refills | Status: DC

## 2019-07-11 NOTE — Telephone Encounter (Signed)
Pt request refill  dofetilide (TIKOSYN) 125 MCG capsule  JARDIANCE 25 MG TABS tablet  Pt states the pharmacy has been trying 2 weeks, but we do not have this request. Pt is now completely out, and has had to get 2 emergency supplies from the pharmacy.  [facility] [place], [place] - [address] [phone] (Phone) [phone] (Fax)

## 2019-07-11 NOTE — Telephone Encounter (Signed)
Rx has been sent in. Left a detailed message on verified voice mail.   

## 2019-07-12 ENCOUNTER — Other Ambulatory Visit: Payer: Self-pay

## 2019-07-12 ENCOUNTER — Ambulatory Visit (INDEPENDENT_AMBULATORY_CARE_PROVIDER_SITE_OTHER): Payer: Medicare Other | Admitting: *Deleted

## 2019-07-12 DIAGNOSIS — I4819 Other persistent atrial fibrillation: Secondary | ICD-10-CM

## 2019-07-13 ENCOUNTER — Other Ambulatory Visit: Payer: Self-pay | Admitting: *Deleted

## 2019-07-13 MED ORDER — TRELEGY ELLIPTA 100-62.5-25 MCG/INH IN AEPB: 1.0000 | INHALATION_SPRAY | Freq: Every day | RESPIRATORY_TRACT | 3 refills | Status: DC

## 2019-07-18 ENCOUNTER — Other Ambulatory Visit: Payer: Medicare Other

## 2019-07-18 ENCOUNTER — Telehealth: Payer: Self-pay | Admitting: Emergency Medicine

## 2019-07-18 NOTE — Telephone Encounter (Signed)
Patient asymptomatic during event. + Xarelto  And no missed Tikyson doses.

## 2019-07-18 NOTE — Telephone Encounter (Signed)
LMOM. Assess for symptoms . AF found on LINQ transmission. HX PAF on Xarelto.

## 2019-07-18 NOTE — Telephone Encounter (Signed)
Received following medication via MyChart.   Adam [name] "Adam [name]" to [name], MD      07/18/19 2:19 PM Dr Rayann Heman, I cannot stop thinking about my a-fib registering yesterday. What just came to mind is the fact that for 10 days to two weeks my pharmacy could not reach my doctor (which one- I know not). Anyway, I was out of dofetilide completely! Could this be the root of my problem??? Adam [name] [employer]	[place]

## 2019-07-23 ENCOUNTER — Other Ambulatory Visit: Payer: Self-pay | Admitting: Internal Medicine

## 2019-07-25 DIAGNOSIS — D225 Melanocytic nevi of trunk: Secondary | ICD-10-CM | POA: Diagnosis not present

## 2019-07-25 DIAGNOSIS — Z85828 Personal history of other malignant neoplasm of skin: Secondary | ICD-10-CM | POA: Diagnosis not present

## 2019-07-25 DIAGNOSIS — L570 Actinic keratosis: Secondary | ICD-10-CM | POA: Diagnosis not present

## 2019-07-25 DIAGNOSIS — D485 Neoplasm of uncertain behavior of skin: Secondary | ICD-10-CM | POA: Diagnosis not present

## 2019-07-25 DIAGNOSIS — B079 Viral wart, unspecified: Secondary | ICD-10-CM | POA: Diagnosis not present

## 2019-07-25 DIAGNOSIS — B353 Tinea pedis: Secondary | ICD-10-CM | POA: Diagnosis not present

## 2019-07-25 DIAGNOSIS — L820 Inflamed seborrheic keratosis: Secondary | ICD-10-CM | POA: Diagnosis not present

## 2019-07-26 LAB — CUP PACEART INCLINIC DEVICE CHECK
Date Time Interrogation Session: 20201008203538
Implantable Pulse Generator Implant Date: 20200928

## 2019-07-26 NOTE — Progress Notes (Signed)
ILR wound check in clinic. Steri strips removed. Wound well healed. R-wave 0.23 mV. Home monitor transmitting nightly. 27 AF episodes recorded, At/AF burden 0.5 %, longest episode was  6 minutes., known AF.  + Xarelto.Questions answered.

## 2019-08-05 LAB — CUP PACEART REMOTE DEVICE CHECK
Date Time Interrogation Session: 20201031154038
Implantable Pulse Generator Implant Date: 20200928

## 2019-08-06 ENCOUNTER — Ambulatory Visit (INDEPENDENT_AMBULATORY_CARE_PROVIDER_SITE_OTHER): Payer: Medicare Other | Admitting: *Deleted

## 2019-08-06 DIAGNOSIS — I4819 Other persistent atrial fibrillation: Secondary | ICD-10-CM

## 2019-08-08 ENCOUNTER — Other Ambulatory Visit: Payer: Medicare Other | Admitting: *Deleted

## 2019-08-08 ENCOUNTER — Other Ambulatory Visit: Payer: Self-pay

## 2019-08-08 DIAGNOSIS — E669 Obesity, unspecified: Secondary | ICD-10-CM

## 2019-08-08 DIAGNOSIS — G4733 Obstructive sleep apnea (adult) (pediatric): Secondary | ICD-10-CM | POA: Diagnosis not present

## 2019-08-08 DIAGNOSIS — I712 Thoracic aortic aneurysm, without rupture, unspecified: Secondary | ICD-10-CM

## 2019-08-08 DIAGNOSIS — E785 Hyperlipidemia, unspecified: Secondary | ICD-10-CM

## 2019-08-08 DIAGNOSIS — I10 Essential (primary) hypertension: Secondary | ICD-10-CM | POA: Diagnosis not present

## 2019-08-08 DIAGNOSIS — I4819 Other persistent atrial fibrillation: Secondary | ICD-10-CM

## 2019-08-08 DIAGNOSIS — I2510 Atherosclerotic heart disease of native coronary artery without angina pectoris: Secondary | ICD-10-CM

## 2019-08-08 LAB — LIPID PANEL
Chol/HDL Ratio: 3.8 ratio (ref 0.0–5.0)
Cholesterol, Total: 142 mg/dL (ref 100–199)
HDL: 37 mg/dL — ABNORMAL LOW (ref >39)
LDL Chol Calc (NIH): 65 mg/dL (ref 0–99)
Triglycerides: 247 mg/dL — ABNORMAL HIGH (ref 0–149)
VLDL Cholesterol Cal: 40 mg/dL (ref 5–40)

## 2019-08-08 LAB — ALT: ALT: 29 IU/L (ref 0–44)

## 2019-08-09 ENCOUNTER — Telehealth: Payer: Self-pay | Admitting: *Deleted

## 2019-08-09 DIAGNOSIS — E781 Pure hyperglyceridemia: Secondary | ICD-10-CM

## 2019-08-09 DIAGNOSIS — E119 Type 2 diabetes mellitus without complications: Secondary | ICD-10-CM | POA: Diagnosis not present

## 2019-08-09 LAB — HM DIABETES EYE EXAM

## 2019-08-09 MED ORDER — FENOFIBRATE 200 MG PO CAPS: 200.0000 mg | ORAL_CAPSULE | Freq: Every day | ORAL | 3 refills | Status: DC

## 2019-08-09 NOTE — Telephone Encounter (Signed)
-----   Message from Ramond Dial, [facility] sent at 08/09/2019 12:56 PM EST ----- Triglycerides are still elevated, but much improved from 2 months ago. Would recommend increasing fenofibrate to 200mg  daily. Patient should also try to limits alcohols, sweets (desserts, soda, juice) and carbs (breads, pastas, rice, potatoes, crackers ect)

## 2019-08-09 NOTE — Telephone Encounter (Signed)
Pt has been notified of lab results and recommendations from Pharm D with the [facility]. Pt is agreeable to increase Fenofibrate to 200 mg daily. Advised of dietary recommendations try to limits alcohols, sweets (desserts, soda, juice) and carbs (breads, pastas, rice, potatoes, crackers ect). Pt is agreeable to plan of care and thanked me for the call. New Rx has been sent in for Fenofibrate 200 mg daily. Patient notified of result.  Please refer to phone note from today for complete details.   Julaine Hua, CMA 08/09/2019 1:48 PM

## 2019-08-09 NOTE — Progress Notes (Signed)
Cardiology Office Note   Date:  08/13/2019   ID:  Adam [name], DOB Feb 06, 1951, MRN [id number]  PCP:  Adam Post, MD  Cardiologist:   Adam Vasudevan Martinique, MD   Chief Complaint  Patient presents with  . Atrial Fibrillation  . Coronary Artery Disease      History of Present Illness: Adam [name] is a 68 y.o. male who presents to establish cardiac follow up . He is a former patient of Adam [name]. He is followed by Adam. Rayann [name] in the EP clinic. He has a past medical history which includesCABG in 2011 done in [place], HTN, HLD, DM, Atrial fibrillation, and OSA- followed by Adam [name]. He underwent catheter ablation for atrial fibrillation by Adam. Rayann [name] 03/31/18.He is onTikosyn and Xarelto.Event monitor in June 2020 showed no Afib. Because of intermittent palpitations Adam [name] placed an ILR in September. To date Afib burden is 0.5-0.9%. He is followed by Adam. Radford [name] in sleep clinic, but unable to tolerate CPAP. He reports that his sleep apnea is mild.  He had an echo in January of 2019 that showed normal LVEF and no significant valvular abnormalities. Stress test in 2013 showed no ischemia.He had an abnormal chest CT done 01/2018 that showed an ascending aortic aneurysm, measuring 43 x 42 mm. Repeat CT angio in May 2020 showed no change in size.   The patient reports that prior to CABG he had 25 stents placed over a 10-12 year period.  These procedures and his bypass were done in [place], [place]. He notes he previously exercised regularly but over the past 2 years hasn't been able to do much. 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 He has had a couple of injections but this hasn't really helped. He has lost 9 lbs since August but admits he doesn't eat all that healthy. Last A1c 7.2%. He notes he ran out of his Tikosyn for about 10 days in September and this was when he noted more palpitations. Since he resumed he has done well. He currently denies any dyspnea  or chest pain.   Past Medical History:  Diagnosis Date  . Allergy   . Anxiety    history of PTSD following CABG  . Ascending aortic aneurysm ([facility]) 01/31/2018   43 x 42 mm, pt unaware  . Asthma   . Cardiomegaly 10/17/2017  . Colitis- colonoscopy 2014 07/13/2015  . COPD (chronic obstructive pulmonary disease) ([facility])   . Coronary artery disease    x 6  . Depression   . Diabetes mellitus without complication ([place])   . Family history of polyps in the colon   . Finger dislocation    Left pinkie  . GERD (gastroesophageal reflux disease)   . Gout   . H/O atrial fibrillation without current medication    following CABG with no documented episodes since then.  . Heart palpitations   . Hx of adenomatous colonic polyps 08/12/2010  . Hyperlipidemia   . Hypertension   . OA (osteoarthritis)   . OSA (obstructive sleep apnea)    Mild, has not received CPAP yet  . Prediabetes   . RLS (restless legs syndrome)   . 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 ANKLE FRACTURE SURGERY Right 1991  . APPENDECTOMY    . ATRIAL FIBRILLATION ABLATION N/A 03/31/2018   Procedure: ATRIAL FIBRILLATION ABLATION;  Surgeon: Adam Grayer, MD;  Location: [facility];  Service: Cardiovascular;  Laterality: N/A;  . COLONOSCOPY W/ BIOPSIES  2017   x7  . CORONARY ANGIOPLASTY WITH STENT PLACEMENT    . CORONARY ARTERY BYPASS GRAFT    . FINGER SURGERY  04/2018   Small finger left hand  . implantable loop recorder placement  07/02/2019   Medtronic Reveal [place] model [id number] ([place] [id number]) implanted in office by Adam [name]  . TEE WITHOUT CARDIOVERSION N/A 03/30/2018   Procedure: TRANSESOPHAGEAL ECHOCARDIOGRAM (TEE);  Surgeon: Adam Klein, MD;  Location: [facility];  Service: Cardiovascular;  Laterality: N/A;  . TOTAL HIP ARTHROPLASTY Left   . TOTAL HIP ARTHROPLASTY Right 09/12/2018   Procedure: TOTAL HIP ARTHROPLASTY ANTERIOR APPROACH;  Surgeon:  [name], MD;  Location: [facility];  Service: Orthopedics;  Laterality: Right;  70 mins     Current Outpatient Medications  Medication Sig Dispense Refill  . allopurinol (ZYLOPRIM) 300 MG tablet TAKE 1 TABLET BY MOUTH EVERY DAY 90 tablet 1  . Coenzyme Q10 (CO Q-10) 100 MG CAPS Take 100 mg by mouth daily.     . diphenhydrAMINE HCl, Sleep, (ZZZQUIL) 25 MG CAPS Take 25 mg by mouth at bedtime as needed (for sleep).    . dofetilide (TIKOSYN) 125 MCG capsule Take 1 capsule (125 mcg total) by mouth 2 (two) times daily. 60 capsule 2  . econazole nitrate 1 % cream Apply 1 application topically every other day.     . fenofibrate micronized (LOFIBRA) 200 MG capsule Take 1 capsule (200 mg total) by mouth daily before breakfast. 90 capsule 3  . fluticasone (FLONASE) 50 MCG/ACT nasal spray Place 1 spray into both nostrils daily.     . Fluticasone-Umeclidin-Vilant (TRELEGY ELLIPTA) 100-62.5-25 MCG/INH AEPB Inhale 1 puff into the lungs daily. 60 each 3  . gabapentin (NEURONTIN) 300 MG capsule Take two to three tablets at night as needed for restless leg symptoms 270 capsule 3  . glucose blood (ONETOUCH VERIO) test strip Test once daily.  Dx E11.9 100 each 3  . hydrochlorothiazide (HYDRODIURIL) 25 MG tablet Take 1 tablet (25 mg total) by mouth daily. 90 tablet 3  . Icosapent Ethyl (VASCEPA) 1 g CAPS Take 2 capsules by mouth 2 (two) times daily.     . indomethacin (INDOCIN) 25 MG capsule TK 1 C PO BID    . JARDIANCE 25 MG TABS tablet TAKE 1 TABLET BY MOUTH DAILY 90 tablet 0  . LORazepam (ATIVAN) 0.5 MG tablet Take 1 tablet (0.5 mg total) by mouth every 8 (eight) hours as needed for anxiety. 90 tablet 5  . losartan (COZAAR) 100 MG tablet TAKE 1 TABLET(100 MG) BY MOUTH DAILY 90 tablet 0  . metFORMIN (GLUCOPHAGE) 1000 MG tablet TAKE 1 TABLET BY MOUTH TWICE DAILY WITH A MEAL 180 tablet 1  . methocarbamol (ROBAXIN) 500 MG tablet TK 1 T PO Q 6 H PRF SPASMS    . metoprolol tartrate (LOPRESSOR) 50 MG tablet Take 2  tablets (100 mg total) by mouth 2 (two) times daily. 180 tablet 3  . montelukast (SINGULAIR) 10 MG tablet TAKE 1 TABLET BY MOUTH AT BEDTIME 90 tablet 1  . Olopatadine HCl 0.2 % SOLN Apply 1 drop to eye daily as needed. Apply to both  eyes    . potassium chloride SA (K-DUR) 20 MEQ tablet Take  2 tablets in the AM and 1 tablet in the PM 60 tablet 8  . RABEprazole (ACIPHEX) 20 MG tablet Take 1 tablet (20 mg total) by  mouth daily. 90 tablet 1  . rosuvastatin (CRESTOR) 40 MG tablet Take 1 tablet (40 mg total) by mouth daily. 90 tablet 3  . sitaGLIPtin (JANUVIA) 100 MG tablet Take 1 tablet (100 mg total) by mouth daily. 90 tablet 1  . TOUJEO SOLOSTAR 300 UNIT/ML SOPN INJECT 60 UNITS AS DIRECTED DAILY 33 mL 1  . XARELTO 20 MG TABS tablet TAKE 1 TABLET BY MOUTH DAILY WITH SUPPER 30 tablet 6   No current facility-administered medications for this visit.     Allergies:   Amoxicillin, Augmentin [amoxicillin-pot clavulanate], Azithromycin, Clindamycin/lincomycin, and Keflex [cephalexin]    Social History:  The patient  reports that he quit smoking about 41 years ago. His smoking use included cigarettes. He has a 7.00 pack-year smoking history. He has never used smokeless tobacco. He reports current alcohol use. He reports that he does not use drugs.   Family History:  The patient's family history includes Colon cancer in his mother; Heart disease in his paternal grandfather and paternal grandmother.    ROS:  Please see the history of present illness.   Otherwise, review of systems are positive for none.   All other systems are reviewed and negative.    PHYSICAL EXAM: VS:  BP 120/74   Pulse 99   Temp (!) 96.8 F (36 C)   Ht 5\' 10"  (1.778 m)   Wt 255 lb 9.6 oz (115.9 kg)   SpO2 95%   BMI 36.67 kg/m  , BMI Body mass index is 36.67 kg/m. GEN: Well nourished, obese, in no acute distress  HEENT: normal  Neck: no JVD, carotid bruits, or masses Cardiac: RRR; no murmurs, rubs, or gallops,no edema   Respiratory:  clear to auscultation bilaterally, normal work of breathing GI: soft, nontender, nondistended, + BS MS: no deformity or atrophy  Skin: warm and dry, no rash Neuro:  Strength and sensation are intact Psych: euthymic mood, full affect   EKG:  EKG is not ordered today. The ekg ordered today demonstrates N/A   Recent Labs: 12/29/2018: TSH 1.32 02/15/2019: Hemoglobin 12.4; Magnesium 2.1; Platelets 178 05/14/2019: BUN 9; Creatinine, Ser 0.84; Potassium 4.0; Sodium 136 08/08/2019: ALT 29    Lipid Panel    Component Value Date/Time   CHOL 142 08/08/2019 1058   TRIG 247 (H) 08/08/2019 1058   HDL 37 (L) 08/08/2019 1058   CHOLHDL 3.8 08/08/2019 1058   CHOLHDL 5 06/23/2018 1118   VLDL 72.4 (H) 06/23/2018 1118   LDLCALC 65 08/08/2019 1058   LDLDIRECT 124.0 06/23/2018 1118      Wt Readings from Last 3 Encounters:  08/13/19 255 lb 9.6 oz (115.9 kg)  07/02/19 254 lb (115.2 kg)  06/27/19 261 lb (118.4 kg)      Other studies Reviewed: Additional studies/ records that were reviewed today include:  Echo 03/30/18: Study Conclusions  - Left ventricle: The cavity size was normal. Wall thickness was   normal. Systolic function was normal. The estimated ejection   fraction was in the range of 55% to 60%. Wall motion was normal;   there were no regional wall motion abnormalities. - Mitral valve: Mildly calcified annulus. - Left atrium: No evidence of thrombus in the atrial cavity or   appendage. No spontaneous echo contrast was observed. The   appendage was morphologically a left appendage, multilobulated,   and of normal size. Emptying velocity was mildly reduced. - Right atrium: No evidence of thrombus in the atrial cavity or   appendage. - Atrial septum: There was  a very small patent foramen ovale.   Doppler showed a trivial left-to-right shunt, in the baseline   state.  Event monitor 03/07/19: Study Highlights  Sinus rhythm Rare premature ventricular contractions  Nonsustained ventricular tachycardia No sustained arrhythmias No atrial fibrillation Baseline artifact limits interpretation at times.      ASSESSMENT AND PLAN:  1. Thoracic aortic aneurysm: Incidental finding on a chest CT done April 2019, measuring at 43 x 42 mm. Repeat scan in May 2020 showed no change.  He is asymptomatic.  Recommend annual imaging for surveillance.   2.  CAD: Status [name] CABG in 2011 done in [place]. Multiple stents prior to this. He has done well since CABG. No angina.   Last Myoview noted in our records was 2013 and was normal. Continue medical therapy with beta-blocker and statin.  He is not on aspirin due to Xarelto.     3.  Paroxysmal atrial fibrillation: Followed by Adam. Rayann [name]  History of A. fib ablation in June 2019. Now with ILR in place with low Afib burden.  He is currently on Tikosyn, metoprolol and Xarelto.    4.  Obstructive sleep apnea: Followed in sleep clinic by Adam. Radford [name].  He reports that he is noncompliant with CPAP due to inability to tolerate device.  5. DM: management per PCP. He is on [place]. Stressed importance of low carb diet.   6. HTN. Currently well controlled.  7. Mixed HLD. LDL at goal on statin. Triglycerides elevated. On Vascepa. Continue Rx.    Disposition:   FU with me in 6 months  Signed, Ranika Mcniel Martinique, MD  08/13/2019 5:18 PM    [facility] [address], [place], [place], [address] Phone [phone], Fax [phone]

## 2019-08-13 ENCOUNTER — Telehealth: Payer: Self-pay

## 2019-08-13 ENCOUNTER — Ambulatory Visit (INDEPENDENT_AMBULATORY_CARE_PROVIDER_SITE_OTHER): Payer: Medicare Other | Admitting: Cardiology

## 2019-08-13 ENCOUNTER — Encounter

## 2019-08-13 ENCOUNTER — Other Ambulatory Visit: Payer: Self-pay

## 2019-08-13 ENCOUNTER — Encounter: Payer: Self-pay | Admitting: Cardiology

## 2019-08-13 VITALS — BP 120/74 | HR 99 | Temp 96.8°F | Ht 70.0 in | Wt 255.6 lb

## 2019-08-13 DIAGNOSIS — G4733 Obstructive sleep apnea (adult) (pediatric): Secondary | ICD-10-CM | POA: Diagnosis not present

## 2019-08-13 DIAGNOSIS — I10 Essential (primary) hypertension: Secondary | ICD-10-CM

## 2019-08-13 DIAGNOSIS — I480 Paroxysmal atrial fibrillation: Secondary | ICD-10-CM

## 2019-08-13 DIAGNOSIS — I25708 Atherosclerosis of coronary artery bypass graft(s), unspecified, with other forms of angina pectoris: Secondary | ICD-10-CM

## 2019-08-13 DIAGNOSIS — E669 Obesity, unspecified: Secondary | ICD-10-CM

## 2019-08-13 DIAGNOSIS — E782 Mixed hyperlipidemia: Secondary | ICD-10-CM

## 2019-08-13 NOTE — Telephone Encounter (Signed)
FAXED NOTES TO [facility] [phone] 

## 2019-08-13 NOTE — Telephone Encounter (Signed)
FAXED FAILED SO SENT NOTES TO [phone]

## 2019-08-15 ENCOUNTER — Encounter: Payer: Self-pay | Admitting: Family Medicine

## 2019-08-28 NOTE — Progress Notes (Signed)
[facility] Summary Report / Loop Recorder 

## 2019-09-06 ENCOUNTER — Ambulatory Visit (INDEPENDENT_AMBULATORY_CARE_PROVIDER_SITE_OTHER): Payer: Medicare Other | Admitting: *Deleted

## 2019-09-06 DIAGNOSIS — I4819 Other persistent atrial fibrillation: Secondary | ICD-10-CM

## 2019-09-06 LAB — CUP PACEART REMOTE DEVICE CHECK
Date Time Interrogation Session: 20201203141648
Implantable Pulse Generator Implant Date: 20200928

## 2019-09-17 DIAGNOSIS — Z471 Aftercare following joint replacement surgery: Secondary | ICD-10-CM | POA: Diagnosis not present

## 2019-09-17 DIAGNOSIS — Z96643 Presence of artificial hip joint, bilateral: Secondary | ICD-10-CM | POA: Diagnosis not present

## 2019-09-17 DIAGNOSIS — Z96641 Presence of right artificial hip joint: Secondary | ICD-10-CM | POA: Diagnosis not present

## 2019-09-17 DIAGNOSIS — M545 Low back pain: Secondary | ICD-10-CM | POA: Diagnosis not present

## 2019-09-20 ENCOUNTER — Other Ambulatory Visit: Payer: Self-pay | Admitting: Family Medicine

## 2019-09-24 ENCOUNTER — Other Ambulatory Visit: Payer: Self-pay

## 2019-09-24 ENCOUNTER — Ambulatory Visit (INDEPENDENT_AMBULATORY_CARE_PROVIDER_SITE_OTHER): Payer: Medicare Other | Admitting: Family Medicine

## 2019-09-24 ENCOUNTER — Encounter: Payer: Self-pay | Admitting: Family Medicine

## 2019-09-24 VITALS — BP 114/68 | HR 84 | Temp 97.3°F | Ht 70.0 in | Wt 259.8 lb

## 2019-09-24 DIAGNOSIS — M1A9XX0 Chronic gout, unspecified, without tophus (tophi): Secondary | ICD-10-CM | POA: Diagnosis not present

## 2019-09-24 DIAGNOSIS — E1165 Type 2 diabetes mellitus with hyperglycemia: Secondary | ICD-10-CM

## 2019-09-24 DIAGNOSIS — I10 Essential (primary) hypertension: Secondary | ICD-10-CM

## 2019-09-24 LAB — POCT GLYCOSYLATED HEMOGLOBIN (HGB A1C): Hemoglobin A1C: 7.1 % — AB (ref 4.0–5.6)

## 2019-09-24 MED ORDER — ALLOPURINOL 300 MG PO TABS: 300.0000 mg | ORAL_TABLET | Freq: Every day | ORAL | 3 refills | Status: DC

## 2019-09-24 NOTE — Patient Instructions (Signed)
Your A1C is improved to 7.1%.    Lets' plan on 3 month follow up.

## 2019-09-24 NOTE — Progress Notes (Signed)
Subjective:     Patient ID: Adam [name], male   DOB: 1951-09-15, 68 y.o.   MRN: [id number]  HPI [name] has multiple medical problems including obesity, atrial fibrillation, hypertension, CAD, COPD, GERD, type 2 diabetes, gout, history of posttraumatic stress disorder, dyslipidemia.  Recent poor control of diabetes.  His last A1c was 8.0%.  We added Januvia and he is taking this without side effect.  He is not a candidate for GLP-1 medication because of prior history of pancreatitis.  No recent hypoglycemic symptoms.  Not monitoring blood sugars regularly.  No significant polyuria or polydipsia.  He has gout and is done very well on prophylaxis with allopurinol.  No recent flareups.  Requesting refills.  His blood pressure stable.  He remains on losartan, metoprolol, and HCTZ  Wt Readings from Last 3 Encounters:  09/24/19 259 lb 12.8 oz (117.8 kg)  08/13/19 255 lb 9.6 oz (115.9 kg)  07/02/19 254 lb (115.2 kg)     Past Medical History:  Diagnosis Date  . Allergy   . Anxiety    history of PTSD following CABG  . Ascending aortic aneurysm ([facility]) 01/31/2018   43 x 42 mm, pt unaware  . Asthma   . Cardiomegaly 10/17/2017  . Colitis- colonoscopy 2014 07/13/2015  . COPD (chronic obstructive pulmonary disease) ([facility])   . Coronary artery disease    x 6  . Depression   . Diabetes mellitus without complication ([facility])   . Family history of polyps in the colon   . Finger dislocation    Left pinkie  . GERD (gastroesophageal reflux disease)   . Gout   . H/O atrial fibrillation without current medication    following CABG with no documented episodes since then.  . Heart palpitations   . Hx of adenomatous colonic polyps 08/12/2010  . Hyperlipidemia   . Hypertension   . OA (osteoarthritis)   . OSA (obstructive sleep apnea)    Mild, has not received CPAP yet  . Prediabetes   . RLS (restless legs syndrome)   . 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 ANKLE FRACTURE SURGERY Right 1991  . APPENDECTOMY    . ATRIAL FIBRILLATION ABLATION N/A 03/31/2018   Procedure: ATRIAL FIBRILLATION ABLATION;  Surgeon: [name], MD;  Location: [facility];  Service: Cardiovascular;  Laterality: N/A;  . CARDIAC ELECTROPHYSIOLOGY MAPPING AND ABLATION    . COLONOSCOPY W/ BIOPSIES  2017   x7  . CORONARY ANGIOPLASTY WITH STENT PLACEMENT    . CORONARY ARTERY BYPASS GRAFT    . FINGER SURGERY  04/2018   Small finger left hand  . implantable loop recorder placement  07/02/2019   Medtronic Reveal [place] model [id number] ([place] [id number]) implanted in office by Dr Rayann Heman  . TEE WITHOUT CARDIOVERSION N/A 03/30/2018   Procedure: TRANSESOPHAGEAL ECHOCARDIOGRAM (TEE);  Surgeon: [name], MD;  Location: [facility];  Service: Cardiovascular;  Laterality: N/A;  . TOTAL HIP ARTHROPLASTY Left   . TOTAL HIP ARTHROPLASTY Right 09/12/2018   Procedure: TOTAL HIP ARTHROPLASTY ANTERIOR APPROACH;  Surgeon: [name], MD;  Location: [facility];  Service: Orthopedics;  Laterality: Right;  70 mins    reports that he quit smoking about 42 years ago. His smoking use included cigarettes. He has a 7.00 pack-year smoking history. He has never used smokeless tobacco. He reports current alcohol use. He reports that he does not use drugs. family history includes Colon cancer  in his mother; Heart disease in his paternal grandfather and paternal grandmother. Allergies  Allergen Reactions  . Amoxicillin Rash and Other (See Comments)    * SEVERE RASH IN GROIN AREA Has patient had a PCN reaction causing immediate rash, facial/tongue/throat swelling, SOB or lightheadedness with hypotension: no Has patient had a PCN reaction causing severe rash involving mucus membranes or skin necrosis: no Has patient had a PCN reaction that required hospitalization: no Has patient had a PCN reaction occurring within the last 10 years: yes If all of the above answers are  "NO", then may proceed with Cephalosporin use.   . Augmentin [Amoxicillin-Pot Clavulanate] Rash and Other (See Comments)    * SEVERE RASH IN GROIN AREA  . Azithromycin Rash and Other (See Comments)    * SEVERE RASH IN GROIN AREA  . Clindamycin/Lincomycin Rash  . Keflex [Cephalexin] Rash     Review of Systems  Constitutional: Negative for fatigue and unexpected weight change.  Eyes: Negative for visual disturbance.  Respiratory: Negative for cough, chest tightness and shortness of breath.   Cardiovascular: Negative for chest pain, palpitations and leg swelling.  Endocrine: Negative for polydipsia and polyuria.  Genitourinary: Negative for dysuria.  Neurological: Negative for dizziness, syncope, weakness, light-headedness and headaches.       Objective:   Physical Exam Vitals reviewed.  Constitutional:      Appearance: Normal appearance.  Cardiovascular:     Rate and Rhythm: Normal rate and regular rhythm.  Pulmonary:     Effort: Pulmonary effort is normal.     Breath sounds: Normal breath sounds.  Musculoskeletal:     Right lower leg: No edema.     Left lower leg: No edema.  Neurological:     Mental Status: He is alert.        Assessment:     #1 type 2 diabetes improving with A1c 7.1%  #2 gout stable on allopurinol.  Requesting refills of allopurinol  #3 hypertension stable and at goal  -#4 dyslipidemia followed by cardiology.  Recent triglycerides improving on Vascepa    Plan:     -We recommend continue weight loss efforts. -We recommended against adding additional medication at this point.  Would like him to lose some more weight and reassess in 3 months.  Hopefully can get A1c below 7% at that time -Refill allopurinol for 1 year -Flu vaccine already given  [name] MD [facility] at [facility]

## 2019-09-27 DIAGNOSIS — M5136 Other intervertebral disc degeneration, lumbar region: Secondary | ICD-10-CM | POA: Diagnosis not present

## 2019-10-01 ENCOUNTER — Telehealth (INDEPENDENT_AMBULATORY_CARE_PROVIDER_SITE_OTHER): Payer: Medicare Other | Admitting: Internal Medicine

## 2019-10-01 ENCOUNTER — Encounter: Payer: Self-pay | Admitting: Internal Medicine

## 2019-10-01 ENCOUNTER — Other Ambulatory Visit: Payer: Self-pay

## 2019-10-01 VITALS — Ht 70.0 in | Wt 260.0 lb

## 2019-10-01 DIAGNOSIS — G4733 Obstructive sleep apnea (adult) (pediatric): Secondary | ICD-10-CM

## 2019-10-01 DIAGNOSIS — I480 Paroxysmal atrial fibrillation: Secondary | ICD-10-CM

## 2019-10-01 DIAGNOSIS — I10 Essential (primary) hypertension: Secondary | ICD-10-CM | POA: Diagnosis not present

## 2019-10-01 MED ORDER — DOFETILIDE 125 MCG PO CAPS: 125.0000 ug | ORAL_CAPSULE | Freq: Two times a day (BID) | ORAL | 11 refills | Status: DC

## 2019-10-01 NOTE — Progress Notes (Signed)
Electrophysiology TeleHealth Note   Due to national recommendations of social distancing due to COVID 19, an audio/video telehealth visit is felt to be most appropriate for this patient at this time.  See MyChart message from today for the patient's consent to telehealth for [facility].  Date:  10/01/2019   ID:  Adam [name], DOB [birth year], MRN [id number]  Location: patient's home  Provider location:  [facility]  Evaluation Performed: Follow-up visit  PCP:  [name], MD   Electrophysiologist:  Dr Rayann Heman  Chief Complaint:  palpitations  History of Present Illness:    Adam [name] is a 68 y.o. male who presents via telehealth conferencing today.  Since last being seen in our clinic, the patient reports doing reasonably well.  He has not been compliant with recommendations for orthodontic prosthesis for OSA and does not use CPAP.  He has not been very successful with weight loss and is not active due to back pain.  He has occasional afib, though this appears to be mostly controlled.  He missed 10 days of tikosyn in early October as the refill was called by pharmacy to his PCP rather than to cardiology.  He has restarted tikosyn and is doing well at this point.  He is frustrated today and states that I did not spend enough time with him during his last visit.  He states "you just slapped that loop recorder in me and sent me on my way".  I reminded him of our indepth conversation prior to the ILR implant about options including its implant.  We also discussed his poor adherence to my recommendations regarding lifestyle modification at that visit.  We discussed Arrest AF and Legacy trial results related to improved AF outcomes with his afib.  In addition, we spoke casually for more than 15 minutes during the implant procedure and while I was holding pressure.  By the end of that visit, my understanding from his was that all of his questions were answered and that he  was satisfied with the visit.  I am very surprised today to hear that he was not happy with that visit.  I did apologize to him today for any misunderstanding. Today, he denies symptoms of palpitations, chest pain, shortness of breath,  lower extremity edema, dizziness, presyncope, or syncope.  The patient is otherwise without complaint today.    Past Medical History:  Diagnosis Date  . Allergy   . Anxiety    history of PTSD following CABG  . Ascending aortic aneurysm ([place]) 01/31/2018   43 x 42 mm, pt unaware  . Asthma   . Cardiomegaly 10/17/2017  . Colitis- colonoscopy 2014 07/13/2015  . COPD (chronic obstructive pulmonary disease) ([place])   . Coronary artery disease    x 6  . Depression   . Diabetes mellitus without complication ([place])   . Family history of polyps in the colon   . Finger dislocation    Left pinkie  . GERD (gastroesophageal reflux disease)   . Gout   . H/O atrial fibrillation without current medication    following CABG with no documented episodes since then.  . Heart palpitations   . Hx of adenomatous colonic polyps 08/12/2010  . Hyperlipidemia   . Hypertension   . OA (osteoarthritis)   . OSA (obstructive sleep apnea)    Mild, has not received CPAP yet  . Prediabetes   . RLS (restless legs syndrome)   . 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 ANKLE FRACTURE SURGERY Right 1991  . APPENDECTOMY    . ATRIAL FIBRILLATION ABLATION N/A 03/31/2018   Procedure: ATRIAL FIBRILLATION ABLATION;  Surgeon: [name], MD;  Location: [facility];  Service: Cardiovascular;  Laterality: N/A;  . CARDIAC ELECTROPHYSIOLOGY MAPPING AND ABLATION    . COLONOSCOPY W/ BIOPSIES  2017   x7  . CORONARY ANGIOPLASTY WITH STENT PLACEMENT    . CORONARY ARTERY BYPASS GRAFT    . FINGER SURGERY  04/2018   Small finger left hand  . implantable loop recorder placement  07/02/2019   Medtronic Reveal [place] model [id number] ([place]  [id number]) implanted in office by Dr Rayann Heman  . TEE WITHOUT CARDIOVERSION N/A 03/30/2018   Procedure: TRANSESOPHAGEAL ECHOCARDIOGRAM (TEE);  Surgeon: [name], MD;  Location: [facility];  Service: Cardiovascular;  Laterality: N/A;  . TOTAL HIP ARTHROPLASTY Left   . TOTAL HIP ARTHROPLASTY Right 09/12/2018   Procedure: TOTAL HIP ARTHROPLASTY ANTERIOR APPROACH;  Surgeon: [name], MD;  Location: [facility];  Service: Orthopedics;  Laterality: Right;  70 mins    Current Outpatient Medications  Medication Sig Dispense Refill  . allopurinol (ZYLOPRIM) 300 MG tablet Take 1 tablet (300 mg total) by mouth daily. 90 tablet 3  . Coenzyme Q10 (CO Q-10) 100 MG CAPS Take 100 mg by mouth daily.     . diphenhydrAMINE HCl, Sleep, (ZZZQUIL) 25 MG CAPS Take 25 mg by mouth at bedtime as needed (for sleep).    . dofetilide (TIKOSYN) 125 MCG capsule Take 1 capsule (125 mcg total) by mouth 2 (two) times daily. May have 90 day refills if patient prefers 60 capsule 11  . econazole nitrate 1 % cream Apply 1 application topically every other day.     . fenofibrate micronized (LOFIBRA) 200 MG capsule Take 1 capsule (200 mg total) by mouth daily before breakfast. 90 capsule 3  . fluticasone (FLONASE) 50 MCG/ACT nasal spray Place 1 spray into both nostrils daily.     . Fluticasone-Umeclidin-Vilant (TRELEGY ELLIPTA) 100-62.5-25 MCG/INH AEPB Inhale 1 puff into the lungs daily. 60 each 3  . gabapentin (NEURONTIN) 300 MG capsule Take two to three tablets at night as needed for restless leg symptoms 270 capsule 3  . glucose blood (ONETOUCH VERIO) test strip Test once daily.  Dx E11.9 100 each 3  . hydrochlorothiazide (HYDRODIURIL) 25 MG tablet Take 1 tablet (25 mg total) by mouth daily. 90 tablet 3  . Icosapent Ethyl (VASCEPA) 1 g CAPS Take 2 capsules by mouth 2 (two) times daily.     . indomethacin (INDOCIN) 25 MG capsule TK 1 C PO BID    . JARDIANCE 25 MG TABS tablet TAKE 1 TABLET BY MOUTH DAILY 90 tablet 0  .  LORazepam (ATIVAN) 0.5 MG tablet Take 1 tablet (0.5 mg total) by mouth every 8 (eight) hours as needed for anxiety. 90 tablet 5  . losartan (COZAAR) 100 MG tablet TAKE 1 TABLET(100 MG) BY MOUTH DAILY 90 tablet 0  . metFORMIN (GLUCOPHAGE) 1000 MG tablet TAKE 1 TABLET BY MOUTH TWICE DAILY WITH A MEAL 180 tablet 1  . methocarbamol (ROBAXIN) 500 MG tablet TK 1 T PO Q 6 H PRF SPASMS    . metoprolol tartrate (LOPRESSOR) 100 MG tablet TAKE 1 TABLET(100 MG) BY MOUTH TWICE DAILY 180 tablet 1  . metoprolol tartrate (LOPRESSOR) 50 MG tablet Take 2 tablets (100 mg total) by mouth 2 (two) times  daily. 180 tablet 3  . montelukast (SINGULAIR) 10 MG tablet TAKE 1 TABLET BY MOUTH AT BEDTIME 90 tablet 1  . Olopatadine HCl 0.2 % SOLN Apply 1 drop to eye daily as needed. Apply to both  eyes    . potassium chloride SA (K-DUR) 20 MEQ tablet Take  2 tablets in the AM and 1 tablet in the PM 60 tablet 8  . RABEprazole (ACIPHEX) 20 MG tablet Take 1 tablet (20 mg total) by mouth daily. 90 tablet 1  . sitaGLIPtin (JANUVIA) 100 MG tablet Take 1 tablet (100 mg total) by mouth daily. 90 tablet 1  . TOUJEO SOLOSTAR 300 UNIT/ML SOPN INJECT 60 UNITS AS DIRECTED DAILY 33 mL 1  . XARELTO 20 MG TABS tablet TAKE 1 TABLET BY MOUTH DAILY WITH SUPPER 30 tablet 6  . rosuvastatin (CRESTOR) 40 MG tablet Take 1 tablet (40 mg total) by mouth daily. 90 tablet 3   No current facility-administered medications for this visit.    Allergies:   Amoxicillin, Augmentin [amoxicillin-pot clavulanate], Azithromycin, Clindamycin/lincomycin, and Keflex [cephalexin]   Social History:  The patient  reports that he quit smoking about 42 years ago. His smoking use included cigarettes. He has a 7.00 pack-year smoking history. He has never used smokeless tobacco. He reports current alcohol use. He reports that he does not use drugs.   Family History:  The patient's  family history includes Colon cancer in his mother; Heart disease in his paternal grandfather  and paternal grandmother.   ROS:  Please see the history of present illness.   All other systems are personally reviewed and negative.    Exam:    Vital Signs:  Ht 5\' 10"  (1.778 m)   Wt 260 lb (117.9 kg)   BMI 37.31 kg/m   Well sounding, alert and conversant, grumpy and expressed frustration about our prior visit  Labs/Other Tests and Data Reviewed:    Recent Labs: 12/29/2018: TSH 1.32 02/15/2019: Hemoglobin 12.4; Magnesium 2.1; Platelets 178 05/14/2019: BUN 9; Creatinine, Ser 0.84; Potassium 4.0; Sodium 136 08/08/2019: ALT 29   Wt Readings from Last 3 Encounters:  10/01/19 260 lb (117.9 kg)  09/24/19 259 lb 12.8 oz (117.8 kg)  08/13/19 255 lb 9.6 oz (115.9 kg)     Last device remote is reviewed from [place] PDF which reveals normal device function, afib burden is 0.2%   ASSESSMENT & PLAN:    1.  Paroxysmal atrial fibrillation afib burden is 0.2% Continue current therapy Continue on xarelto for chads2vasc score of 4. We again discussed the importance of compliance with both medicine and lifestyle modification at length today.  2. OSA Compliance with referral to orthodontist was advised today.  He did not go as advised but states that he will reconsider after COVID 19  3. Morbid obesity Body mass index is 37.31 kg/m. Lifestyle modification again advised including weight loss  4. HTN Stable No change required today  5. CAD No ischemic symptoms  Follow-up:  AF clinic every 3 months for tikosyn management carelink Follow-up with Drs [place] and Radford Pax as scheduled I will see when needed   Patient Risk:  after full review of this patients clinical status, I feel that they are at moderate risk at this time.  Today, I have spent 25 minutes with the patient with telehealth technology discussing arrhythmia management .    Army Fossa, MD  10/01/2019 2:25 PM     [address] [place] [address] [phone]  (  office) [phone] (fax)

## 2019-10-02 NOTE — Progress Notes (Signed)
ILR remote 

## 2019-10-08 ENCOUNTER — Ambulatory Visit (INDEPENDENT_AMBULATORY_CARE_PROVIDER_SITE_OTHER): Payer: Medicare Other | Admitting: *Deleted

## 2019-10-08 DIAGNOSIS — I4819 Other persistent atrial fibrillation: Secondary | ICD-10-CM

## 2019-10-08 LAB — CUP PACEART REMOTE DEVICE CHECK
Date Time Interrogation Session: 20210104000716
Implantable Pulse Generator Implant Date: 20200928

## 2019-10-13 ENCOUNTER — Other Ambulatory Visit (HOSPITAL_COMMUNITY): Payer: Self-pay | Admitting: Nurse Practitioner

## 2019-10-15 DIAGNOSIS — B078 Other viral warts: Secondary | ICD-10-CM | POA: Diagnosis not present

## 2019-10-15 DIAGNOSIS — B353 Tinea pedis: Secondary | ICD-10-CM | POA: Diagnosis not present

## 2019-10-15 DIAGNOSIS — L578 Other skin changes due to chronic exposure to nonionizing radiation: Secondary | ICD-10-CM | POA: Diagnosis not present

## 2019-10-15 DIAGNOSIS — L905 Scar conditions and fibrosis of skin: Secondary | ICD-10-CM | POA: Diagnosis not present

## 2019-10-15 NOTE — Telephone Encounter (Signed)
Age 69, weight 117.9kg, SCr 0.84 on 05/14/19, CrCl > 173mL/min, last OV Dec 2020, afib indication

## 2019-10-18 ENCOUNTER — Other Ambulatory Visit: Payer: Self-pay | Admitting: Family Medicine

## 2019-10-24 ENCOUNTER — Inpatient Hospital Stay (HOSPITAL_COMMUNITY)
Admission: EM | Admit: 2019-10-24 | Discharge: 2019-10-27 | DRG: 897 | Disposition: A | Discharge status: Home / Self Care | Payer: Medicare Other | Attending: Internal Medicine | Admitting: Internal Medicine

## 2019-10-24 ENCOUNTER — Encounter (HOSPITAL_COMMUNITY): Payer: Self-pay | Admitting: Emergency Medicine

## 2019-10-24 ENCOUNTER — Other Ambulatory Visit: Payer: Self-pay

## 2019-10-24 DIAGNOSIS — F10230 Alcohol dependence with withdrawal, uncomplicated: Secondary | ICD-10-CM | POA: Diagnosis not present

## 2019-10-24 DIAGNOSIS — I712 Thoracic aortic aneurysm, without rupture: Secondary | ICD-10-CM | POA: Diagnosis present

## 2019-10-24 DIAGNOSIS — Z8249 Family history of ischemic heart disease and other diseases of the circulatory system: Secondary | ICD-10-CM

## 2019-10-24 DIAGNOSIS — F10939 Alcohol use, unspecified with withdrawal, unspecified: Secondary | ICD-10-CM | POA: Diagnosis present

## 2019-10-24 DIAGNOSIS — E785 Hyperlipidemia, unspecified: Secondary | ICD-10-CM | POA: Diagnosis present

## 2019-10-24 DIAGNOSIS — J441 Chronic obstructive pulmonary disease with (acute) exacerbation: Secondary | ICD-10-CM | POA: Diagnosis present

## 2019-10-24 DIAGNOSIS — Z20822 Contact with and (suspected) exposure to covid-19: Secondary | ICD-10-CM | POA: Diagnosis present

## 2019-10-24 DIAGNOSIS — I4819 Other persistent atrial fibrillation: Secondary | ICD-10-CM | POA: Diagnosis present

## 2019-10-24 DIAGNOSIS — G4733 Obstructive sleep apnea (adult) (pediatric): Secondary | ICD-10-CM | POA: Diagnosis present

## 2019-10-24 DIAGNOSIS — E1159 Type 2 diabetes mellitus with other circulatory complications: Secondary | ICD-10-CM | POA: Diagnosis present

## 2019-10-24 DIAGNOSIS — Z87891 Personal history of nicotine dependence: Secondary | ICD-10-CM

## 2019-10-24 DIAGNOSIS — Z79899 Other long term (current) drug therapy: Secondary | ICD-10-CM

## 2019-10-24 DIAGNOSIS — F10231 Alcohol dependence with withdrawal delirium: Secondary | ICD-10-CM | POA: Diagnosis not present

## 2019-10-24 DIAGNOSIS — F329 Major depressive disorder, single episode, unspecified: Secondary | ICD-10-CM | POA: Diagnosis present

## 2019-10-24 DIAGNOSIS — I480 Paroxysmal atrial fibrillation: Secondary | ICD-10-CM

## 2019-10-24 DIAGNOSIS — I10 Essential (primary) hypertension: Secondary | ICD-10-CM | POA: Diagnosis not present

## 2019-10-24 DIAGNOSIS — E119 Type 2 diabetes mellitus without complications: Secondary | ICD-10-CM

## 2019-10-24 DIAGNOSIS — Z794 Long term (current) use of insulin: Secondary | ICD-10-CM

## 2019-10-24 DIAGNOSIS — Z881 Allergy status to other antibiotic agents status: Secondary | ICD-10-CM

## 2019-10-24 DIAGNOSIS — Z888 Allergy status to other drugs, medicaments and biological substances status: Secondary | ICD-10-CM

## 2019-10-24 DIAGNOSIS — K649 Unspecified hemorrhoids: Secondary | ICD-10-CM | POA: Diagnosis present

## 2019-10-24 DIAGNOSIS — F10239 Alcohol dependence with withdrawal, unspecified: Secondary | ICD-10-CM | POA: Diagnosis not present

## 2019-10-24 DIAGNOSIS — I2510 Atherosclerotic heart disease of native coronary artery without angina pectoris: Secondary | ICD-10-CM | POA: Diagnosis present

## 2019-10-24 DIAGNOSIS — Z86010 Personal history of colonic polyps: Secondary | ICD-10-CM

## 2019-10-24 DIAGNOSIS — I152 Hypertension secondary to endocrine disorders: Secondary | ICD-10-CM | POA: Diagnosis present

## 2019-10-24 DIAGNOSIS — K922 Gastrointestinal hemorrhage, unspecified: Secondary | ICD-10-CM | POA: Diagnosis present

## 2019-10-24 DIAGNOSIS — F10930 Alcohol use, unspecified with withdrawal, uncomplicated: Secondary | ICD-10-CM

## 2019-10-24 DIAGNOSIS — Z7901 Long term (current) use of anticoagulants: Secondary | ICD-10-CM

## 2019-10-24 DIAGNOSIS — Z96643 Presence of artificial hip joint, bilateral: Secondary | ICD-10-CM | POA: Diagnosis present

## 2019-10-24 DIAGNOSIS — K625 Hemorrhage of anus and rectum: Secondary | ICD-10-CM | POA: Diagnosis not present

## 2019-10-24 DIAGNOSIS — E1169 Type 2 diabetes mellitus with other specified complication: Secondary | ICD-10-CM | POA: Diagnosis present

## 2019-10-24 DIAGNOSIS — F419 Anxiety disorder, unspecified: Secondary | ICD-10-CM | POA: Diagnosis present

## 2019-10-24 DIAGNOSIS — J449 Chronic obstructive pulmonary disease, unspecified: Secondary | ICD-10-CM | POA: Diagnosis present

## 2019-10-24 DIAGNOSIS — Z951 Presence of aortocoronary bypass graft: Secondary | ICD-10-CM

## 2019-10-24 DIAGNOSIS — E8729 Other acidosis: Secondary | ICD-10-CM

## 2019-10-24 DIAGNOSIS — Z9119 Patient's noncompliance with other medical treatment and regimen: Secondary | ICD-10-CM

## 2019-10-24 DIAGNOSIS — E669 Obesity, unspecified: Secondary | ICD-10-CM | POA: Diagnosis present

## 2019-10-24 DIAGNOSIS — E872 Acidosis: Secondary | ICD-10-CM | POA: Diagnosis not present

## 2019-10-24 DIAGNOSIS — Z6836 Body mass index (BMI) 36.0-36.9, adult: Secondary | ICD-10-CM

## 2019-10-24 HISTORY — DX: Alcohol dependence with withdrawal, unspecified: F10.239

## 2019-10-24 HISTORY — DX: Alcohol use, unspecified with withdrawal, unspecified: F10.939

## 2019-10-24 LAB — COMPREHENSIVE METABOLIC PANEL
ALT: 31 U/L (ref 0–44)
AST: 43 U/L — ABNORMAL HIGH (ref 15–41)
Albumin: 4.3 g/dL (ref 3.5–5.0)
Alkaline Phosphatase: 45 U/L (ref 38–126)
Anion gap: 28 — ABNORMAL HIGH (ref 5–15)
BUN: 11 mg/dL (ref 8–23)
CO2: 11 mmol/L — ABNORMAL LOW (ref 22–32)
Calcium: 8.9 mg/dL (ref 8.9–10.3)
Chloride: 99 mmol/L (ref 98–111)
Creatinine, Ser: 1.22 mg/dL (ref 0.61–1.24)
GFR calc Af Amer: >60 mL/min (ref >60)
GFR calc non Af Amer: >60 mL/min (ref >60)
Glucose, Bld: 145 mg/dL — ABNORMAL HIGH (ref 70–99)
Potassium: 4.1 mmol/L (ref 3.5–5.1)
Sodium: 138 mmol/L (ref 135–145)
Total Bilirubin: 1.3 mg/dL — ABNORMAL HIGH (ref 0.3–1.2)
Total Protein: 7.8 g/dL (ref 6.5–8.1)

## 2019-10-24 LAB — CBC WITH DIFFERENTIAL/PLATELET
Abs Immature Granulocytes: 0.07 10*3/uL (ref 0.00–0.07)
Basophils Absolute: 0 10*3/uL (ref 0.0–0.1)
Basophils Relative: 0 %
Eosinophils Absolute: 0 10*3/uL (ref 0.0–0.5)
Eosinophils Relative: 0 %
HCT: 40.6 % (ref 39.0–52.0)
Hemoglobin: 12.7 g/dL — ABNORMAL LOW (ref 13.0–17.0)
Immature Granulocytes: 1 %
Lymphocytes Relative: 5 %
Lymphs Abs: 0.5 10*3/uL — ABNORMAL LOW (ref 0.7–4.0)
MCH: 31.5 pg (ref 26.0–34.0)
MCHC: 31.3 g/dL (ref 30.0–36.0)
MCV: 100.7 fL — ABNORMAL HIGH (ref 80.0–100.0)
Monocytes Absolute: 1.2 10*3/uL — ABNORMAL HIGH (ref 0.1–1.0)
Monocytes Relative: 12 %
Neutro Abs: 8.2 10*3/uL — ABNORMAL HIGH (ref 1.7–7.7)
Neutrophils Relative %: 82 %
Platelets: 172 10*3/uL (ref 150–400)
RBC: 4.03 MIL/uL — ABNORMAL LOW (ref 4.22–5.81)
RDW: 13.5 % (ref 11.5–15.5)
WBC: 9.9 10*3/uL (ref 4.0–10.5)
nRBC: 0 % (ref 0.0–0.2)

## 2019-10-24 LAB — SAMPLE TO BLOOD BANK

## 2019-10-24 LAB — POC OCCULT BLOOD, ED: Fecal Occult Bld: POSITIVE — AB

## 2019-10-24 LAB — PROTIME-INR
INR: 1.1 (ref 0.8–1.2)
Prothrombin Time: 13.8 seconds (ref 11.4–15.2)

## 2019-10-24 MED ORDER — DEXTROSE-NACL 5-0.45 % IV SOLN: INTRAVENOUS | Status: DC

## 2019-10-24 MED ORDER — LORAZEPAM 1 MG PO TABS: 0.0000 mg | ORAL_TABLET | Freq: Two times a day (BID) | ORAL | Status: DC

## 2019-10-24 MED ORDER — LORAZEPAM 2 MG/ML IJ SOLN
0.0000 mg | Freq: Four times a day (QID) | INTRAMUSCULAR | Status: AC
  Administered 2019-10-24: 2 mg via INTRAVENOUS
  Administered 2019-10-25: 1 mg via INTRAVENOUS
  Administered 2019-10-25: 2 mg via INTRAVENOUS
  Filled 2019-10-24 (×4): qty 1

## 2019-10-24 MED ORDER — THIAMINE HCL 100 MG/ML IJ SOLN
100.0000 mg | Freq: Every day | INTRAMUSCULAR | Status: DC
  Administered 2019-10-24: 100 mg via INTRAVENOUS
  Filled 2019-10-24: qty 2

## 2019-10-24 MED ORDER — LORAZEPAM 1 MG PO TABS
0.0000 mg | ORAL_TABLET | Freq: Four times a day (QID) | ORAL | Status: AC
  Administered 2019-10-25 (×2): 2 mg via ORAL
  Administered 2019-10-26: 1 mg via ORAL
  Administered 2019-10-26: 2 mg via ORAL
  Filled 2019-10-24 (×4): qty 2

## 2019-10-24 MED ORDER — SODIUM CHLORIDE 0.9 % IV BOLUS
1000.0000 mL | Freq: Once | INTRAVENOUS | Status: AC
  Administered 2019-10-24: 1000 mL via INTRAVENOUS

## 2019-10-24 MED ORDER — LORAZEPAM 2 MG/ML IJ SOLN: 0.0000 mg | Freq: Two times a day (BID) | INTRAMUSCULAR | Status: DC

## 2019-10-24 NOTE — ED Provider Notes (Signed)
[facility] EMERGENCY DEPARTMENT Provider Note   CSN: [id number] Arrival date & time: 10/24/19  1908     History Chief Complaint  Patient presents with  . Rectal Bleeding    Adam [name] is a 69 y.o. male with a hx of COPD, CAD, DM, thoracic aortic aneurysm, HTN, hyperlipidemia, & afib on Xarelto who presents to the ED with complaints of rectal bleeding since 9AM today. Patient states he has had several episodes of somewhat dark red blood per rectum today, estimates 15-20 episodes, no alleviating/aggravating factors, does not necessarily occur with BMs. He states that he has felt intermittently nauseated with poor PO intake over the past 24 which has lead to him not taking his regular medicines yesterday or today. He states he feels dehydrated. He reports daily EtOH use (2-4 glasses of scotch per night) since age 7. His last drink was about 48 hours ago. He denies hx of alcohol withdrawal. Denies fever, chills, emesis, hematemesis, abdominal pain, melena, chest pain, dyspnea, lightheadedness, dizziness, or syncope. Denies hallucinations or seizure activity. Denies new medications of abnormal ingestions.   HPI     Past Medical History:  Diagnosis Date  . Allergy   . Anxiety    history of PTSD following CABG  . Ascending aortic aneurysm ([place]) 01/31/2018   43 x 42 mm, pt unaware  . Asthma   . Cardiomegaly 10/17/2017  . Colitis- colonoscopy 2014 July 16, 2015  . COPD (chronic obstructive pulmonary disease) ([place])   . Coronary artery disease    x 6  . Depression   . Diabetes mellitus without complication ([place])   . Family history of polyps in the colon   . Finger dislocation    Left pinkie  . GERD (gastroesophageal reflux disease)   . Gout   . H/O atrial fibrillation without current medication    following CABG with no documented episodes since then.  . Heart palpitations   . Hx of adenomatous colonic polyps 08/12/2010  . Hyperlipidemia   . Hypertension   .  OA (osteoarthritis)   . OSA (obstructive sleep apnea)    Mild, has not received CPAP yet  . Prediabetes   . RLS (restless legs syndrome)   . 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 Thoracic aortic aneurysm ([place]) 02/08/2019  . Hemoptysis 10/06/2018  . S/P right THA, 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  . S/P hip replacement 09/12/2018  . Sleep apnea 01/09/2018  . Persistent atrial fibrillation 12/12/2017  . Allergic rhinitis 08/22/2015  . Family history of colon cancer in mother deceased age 70 16-Jul-2015  . Type 2 diabetes mellitus, controlled ([place]) 07/17/2013  . COPD (chronic obstructive pulmonary disease) ([place]) 07/05/2013  . CAD (coronary artery disease) 06/26/2013  . Essential hypertension, benign 06/26/2013  . Dyslipidemia 06/26/2013  . GERD (gastroesophageal reflux disease) 06/26/2013  . History of atrial fibrillation without current medication 06/26/2013  . Gout 06/26/2013  . Osteoarthritis 06/26/2013  . Asthma, mild persistent 06/26/2013  . Alcohol dependence ([place]) 06/26/2013  . PTSD (post-traumatic stress disorder) 06/26/2013  . HLA B27 (HLA B27 positive) 06/26/2013  . Obesity (BMI 30-39.9) 06/26/2013    Past Surgical History:  Procedure Laterality Date  . ANKLE FRACTURE SURGERY Right 1991  . APPENDECTOMY    . ATRIAL FIBRILLATION ABLATION N/A 03/31/2018   Procedure: ATRIAL FIBRILLATION ABLATION;  Surgeon: [name], MD;  Location: [facility];  Service: Cardiovascular;  Laterality: N/A;  .  CARDIAC ELECTROPHYSIOLOGY MAPPING AND ABLATION    . COLONOSCOPY W/ BIOPSIES  2017   x7  . CORONARY ANGIOPLASTY WITH STENT PLACEMENT    . CORONARY ARTERY BYPASS GRAFT    . FINGER SURGERY  04/2018   Small finger left hand  . implantable loop recorder placement  07/02/2019   Medtronic Reveal [place] model [id number] ([place] [id number]) implanted in office by Dr Rayann Heman  . TEE WITHOUT CARDIOVERSION N/A 03/30/2018   Procedure:  TRANSESOPHAGEAL ECHOCARDIOGRAM (TEE);  Surgeon: [name], MD;  Location: [facility];  Service: Cardiovascular;  Laterality: N/A;  . TOTAL HIP ARTHROPLASTY Left   . TOTAL HIP ARTHROPLASTY Right 09/12/2018   Procedure: TOTAL HIP ARTHROPLASTY ANTERIOR APPROACH;  Surgeon: [name], MD;  Location: [facility];  Service: Orthopedics;  Laterality: Right;  70 mins       Family History  Problem Relation Age of Onset  . Colon cancer Mother   . Heart disease Paternal Grandmother   . Heart disease Paternal Grandfather     Social History   Tobacco Use  . Smoking status: Former Smoker    Packs/day: 1.00    Years: 7.00    Pack years: 7.00    Types: Cigarettes    Quit date: 10/04/1977    Years since quitting: 42.0  . Smokeless tobacco: Never Used  . Tobacco comment: never smoked over 1 pack   Substance Use Topics  . Alcohol use: Yes    Comment: stopped wine and now drinks 3 to 4 scotches   . Drug use: No    Comment: Smoked Marijuana back in 1970s    Home Medications Prior to Admission medications   Medication Sig Start Date End Date Taking? Authorizing Provider  allopurinol (ZYLOPRIM) 300 MG tablet Take 1 tablet (300 mg total) by mouth daily. 09/24/19   [name], Alinda Sierras, MD  Coenzyme Q10 (CO Q-10) 100 MG CAPS Take 100 mg by mouth daily.     [provider]  diphenhydrAMINE HCl, Sleep, (ZZZQUIL) 25 MG CAPS Take 25 mg by mouth at bedtime as needed (for sleep).    [provider]  dofetilide (TIKOSYN) 125 MCG capsule Take 1 capsule (125 mcg total) by mouth 2 (two) times daily. May have 90 day refills if patient prefers 10/01/19   [name], MD  econazole nitrate 1 % cream Apply 1 application topically every other day.     [provider]  fenofibrate micronized (LOFIBRA) 200 MG capsule Take 1 capsule (200 mg total) by mouth daily before breakfast. 08/09/19   Turner, Eber Hong, MD  fluticasone (FLONASE) 50 MCG/ACT nasal spray Place 1 spray into both  nostrils daily.  05/12/13   [provider]  Fluticasone-Umeclidin-Vilant (TRELEGY ELLIPTA) 100-62.5-25 MCG/INH AEPB Inhale 1 puff into the lungs daily. 07/13/19   [name], MD  gabapentin (NEURONTIN) 300 MG capsule Take two to three tablets at night as needed for restless leg symptoms 05/29/19   [name], MD  glucose blood (ONETOUCH VERIO) test strip Test once daily.  Dx E11.9 01/26/18   [name], MD  hydrochlorothiazide (HYDRODIURIL) 25 MG tablet Take 1 tablet (25 mg total) by mouth daily. 08/01/18   [name], Alinda Sierras, MD  Icosapent Ethyl (VASCEPA) 1 g CAPS Take 2 capsules by mouth 2 (two) times daily.     [provider]  indomethacin (INDOCIN) 25 MG capsule TK 1 C PO BID 04/26/19   [provider]  JARDIANCE 25 MG TABS tablet TAKE 1 TABLET BY  MOUTH DAILY 10/18/19   [name], Alinda Sierras, MD  LORazepam (ATIVAN) 0.5 MG tablet Take 1 tablet (0.5 mg total) by mouth every 8 (eight) hours as needed for anxiety. 06/25/19   [name], Alinda Sierras, MD  losartan (COZAAR) 100 MG tablet TAKE 1 TABLET(100 MG) BY MOUTH DAILY 04/10/19   [name], Alinda Sierras, MD  metFORMIN (GLUCOPHAGE) 1000 MG tablet TAKE 1 TABLET BY MOUTH TWICE DAILY WITH A MEAL 04/30/19   [name], Alinda Sierras, MD  methocarbamol (ROBAXIN) 500 MG tablet TK 1 T PO Q 6 H PRF SPASMS 06/07/19   [provider]  metoprolol tartrate (LOPRESSOR) 100 MG tablet TAKE 1 TABLET(100 MG) BY MOUTH TWICE DAILY 09/21/19   [name], Alinda Sierras, MD  metoprolol tartrate (LOPRESSOR) 50 MG tablet Take 2 tablets (100 mg total) by mouth 2 (two) times daily. 07/02/19   Allred, Jeneen Rinks, MD  montelukast (SINGULAIR) 10 MG tablet TAKE 1 TABLET BY MOUTH AT BEDTIME 04/30/19   [name], Alinda Sierras, MD  Olopatadine HCl 0.2 % SOLN Apply 1 drop to eye daily as needed. Apply to both  eyes    [provider]  potassium chloride SA (K-DUR) 20 MEQ tablet Take  2 tablets in the AM and 1 tablet in the PM 02/15/19   [name], NP    RABEprazole (ACIPHEX) 20 MG tablet Take 1 tablet (20 mg total) by mouth daily. 06/26/19   [name], Alinda Sierras, MD  rosuvastatin (CRESTOR) 40 MG tablet Take 1 tablet (40 mg total) by mouth daily. 06/27/19 09/25/19  Sueanne Margarita, MD  sitaGLIPtin (JANUVIA) 100 MG tablet Take 1 tablet (100 mg total) by mouth daily. 06/25/19   [name], Alinda Sierras, MD  TOUJEO SOLOSTAR 300 UNIT/ML SOPN INJECT 60 UNITS AS DIRECTED DAILY 01/11/19   [name], Alinda Sierras, MD  XARELTO 20 MG TABS tablet TAKE 1 TABLET BY MOUTH DAILY WITH SUPPER 10/15/19   [name], NP    Allergies    Amoxicillin, Augmentin [amoxicillin-pot clavulanate], Azithromycin, Clindamycin/lincomycin, and Keflex [cephalexin]  Review of Systems   Review of Systems  Constitutional: Negative for chills and fever.  Respiratory: Negative for cough and shortness of breath.   Cardiovascular: Negative for chest pain.  Gastrointestinal: Positive for anal bleeding and nausea. Negative for abdominal pain and vomiting.  Genitourinary: Negative for dysuria.  Neurological: Negative for dizziness, seizures, syncope, weakness and light-headedness.  Psychiatric/Behavioral: Negative for hallucinations.  All other systems reviewed and are negative.   Physical Exam Updated Vital Signs BP (!) 174/91 (BP Location: Right Arm)   Pulse (!) 150   Temp 98 F (36.7 C) (Oral)   Resp 18   SpO2 98%   Physical Exam Vitals and nursing note reviewed. Exam conducted with a chaperone present.  Constitutional:      Appearance: He is well-developed. He is ill-appearing and diaphoretic (mildly).  HENT:     Head: Normocephalic and atraumatic.  Eyes:     General:        Right eye: No discharge.        Left eye: No discharge.     Conjunctiva/sclera: Conjunctivae normal.  Cardiovascular:     Rate and Rhythm: Regular rhythm. Tachycardia present.  Pulmonary:     Effort: Tachypnea present. No respiratory distress.     Breath sounds: Normal breath sounds. No wheezing,  rhonchi or rales.  Abdominal:     General: There is no distension.     Palpations: Abdomen is soft.     Tenderness: There is no  abdominal tenderness. There is no guarding or rebound.  Genitourinary:    Comments: Skin irritation to perianal region. DRE with loose brown stool no bright red blood or melena.  Musculoskeletal:     Cervical back: Neck supple.  Skin:    General: Skin is warm.     Findings: No rash.  Neurological:     Mental Status: He is alert.     Comments: Clear speech. Tremulous.   Psychiatric:        Behavior: Behavior normal.    ED Results / Procedures / Treatments   Labs (all labs ordered are listed, but only abnormal results are displayed) Labs Reviewed  CBC WITH DIFFERENTIAL/PLATELET - Abnormal; Notable for the following components:      Result Value   RBC 4.03 (*)    Hemoglobin 12.7 (*)    MCV 100.7 (*)    Neutro Abs 8.2 (*)    Lymphs Abs 0.5 (*)    Monocytes Absolute 1.2 (*)    All other components within normal limits  COMPREHENSIVE METABOLIC PANEL - Abnormal; Notable for the following components:   CO2 11 (*)    Glucose, Bld 145 (*)    AST 43 (*)    Total Bilirubin 1.3 (*)    Anion gap 28 (*)    All other components within normal limits  PROTIME-INR  SAMPLE TO BLOOD BANK    EKG None  Radiology No results found.  Procedures Procedures (including critical care time)  Medications Ordered in ED Medications - No data to display  ED Course  I have reviewed the triage vital signs and the nursing notes.  Pertinent labs & imaging results that were available during my care of the patient were reviewed by me and considered in my medical decision making (see chart for details).    MDM Rules/Calculators/A&P                      Patient on Xarelto presents with rectal bleeding episodes since 9AM On my assessment he is tachycardic, tachypneic & hypertensive. Afebrile.  Noted to be tremulous and mildly diaphoretic.  Labs per triage reviewed:    CBC: hgb/hct similar to prior. No leukocytosis. Plt WNL.  CMP: Bicarb 11, anion gap  28. Mild hyperglycemia. AST & T bili mildly elevated.  PT/INR: WNL  Concern for alcoholic ketoacidosis (UA pending) with alcohol withdrawal, formal RN calculated CIWA 12--> placed on CIWA protocol, PRN ativan, IV thiamine, NS bolus w/ maintenance D5NS. From a GI bleed standpoint initial hgb/hct WNL, fecal occult positive, DRE w/o melena or bright red blood, has seen Dr. Carlean Purl with [facility] in the past, most recent colonoscopy 11/20/2015: 2 sessile polyps in transverse colon s/p polypectomies, ? Colitis in distal colon- similar to colonoscopy in 2013, mild diverticulosis. Will require continued monitoring.   Will consult for admission.  Findings and plan of care discussed with supervising physician Dr. Wilson Singer who is in agreement.   23:41: CONSULT: Discussed with hospitalist Dr. Posey Pronto- accepts admission.    Final Clinical Impression(s) / ED Diagnoses Final diagnoses:  Alcohol withdrawal syndrome without complication ([place])  Alcoholic ketoacidosis  Rectal bleeding    Rx / DC Orders ED Discharge Orders    None       Amaryllis Dyke, PA-C 10/24/19 2350    Virgel Manifold, MD 10/25/19 1504

## 2019-10-24 NOTE — ED Triage Notes (Signed)
Patient reports rectal bleeding since 9am this morning , he is taking Xarelto , denies fever or chills . No abdominal pain .

## 2019-10-24 NOTE — H&P (Signed)
History and Physical    Adam [name]:[id number] DOB: 01/25/1951 DOA: 10/24/2019  PCP: [name], MD  Patient coming from: Home  I have personally briefly reviewed patient's old medical records in [place]  Chief Complaint: Rectal bleeding  HPI: Adam [name] is a 69 y.o. male with medical history significant for CAD s/p CABG, COPD, paroxysmal atrial fibrillation on Xarelto, ascending aortic aneurysm, insulin-dependent type 2 diabetes, hypertension, hyperlipidemia, anxiety/depression, OSA not currently on CPAP, and alcohol use who presents to the ED for evaluation of rectal bleeding.  Patient states he was having dark red bleeding per rectum beginning around 9 AM on 10/24/2019.  He noticed it being mixed in with stool and had about 15-20 episodes since onset.  He has not had any other obvious bleeding including epistaxis, hemoptysis, hematemesis, or hematuria.  He has not seen any dark black-colored stool.  He denies any nausea, vomiting, abdominal pain, or rectal pain.  He does take Xarelto for history of atrial fibrillation but says he has not taken it in the last 2 days.    He reports a history of colon cancer in his mother.He last had a colonoscopy on 11/20/2015 by [facility] Dr. Carlean Purl which showed 2 sessile polyps in the transverse colon which were resected and questionable colitis in the distal descending and sigmoid colon.  Mild diverticulosis also noted in the sigmoid colon.  Patient states that he has also been feeling anxious over the last 2 days.  He says he takes lorazepam 0.5 mg 3 times a day for his anxiety and has been taking it regularly.  He also reports daily chronic alcohol use since age 70, drinks 2-4 scotch per day.  He reports last alcohol intake about 48 hours ago.  He has noticed having some shakes and chills.  He has not had any hallucinations or seizures.  He does feel he might be having some mild withdrawal from alcohol.  He has also been having  intermittent palpitations.  He reports chronic shortness of breath due to his underlying COPD which is unchanged.  He has not had any chest pain.  ED Course:  Initial vitals show BP 149/91, pulse 140, RR 22, temp 98.0 Fahrenheit, SPO2 98% on room air.  Labs are notable for Hemoglobin 12.7, WBC 9.9, platelets 172,000, sodium 138, potassium 4.1, bicarb 11, BUN 11, creatinine 1.22, serum glucose 145, AST 43, ALT 31, alk phos 45, total bilirubin 1.3, anion gap 28.  SARS-CoV-2 PCR test was obtained and pending.  FOBT was positive.  DRE performed by EDP with loose brown stool, no bright red blood or melena.  Patient was noted to be hypertensive, tachycardic, tachypneic, tremulous, diaphoretic.  There was concern he was in alcohol withdrawal and he was started on CIWA protocol and given IV Ativan 2 mg once.  Also given 1 L normal saline and started on D5-1/2 NS and IV thiamine.  The hospitalist service was consulted to admit for further evaluation and management.  Review of Systems: All systems reviewed and are negative except as documented in history of present illness above.   Past Medical History:  Diagnosis Date  . Allergy   . Anxiety    history of PTSD following CABG  . Ascending aortic aneurysm ([place]) 01/31/2018   43 x 42 mm, pt unaware  . Asthma   . Cardiomegaly 10/17/2017  . Colitis- colonoscopy 2014 07/13/2015  . COPD (chronic obstructive pulmonary disease) ([place])   . Coronary artery disease  x 6  . Depression   . Diabetes mellitus without complication ([place])   . Family history of polyps in the colon   . Finger dislocation    Left pinkie  . GERD (gastroesophageal reflux disease)   . Gout   . H/O atrial fibrillation without current medication    following CABG with no documented episodes since then.  . Heart palpitations   . Hx of adenomatous colonic polyps 08/12/2010  . Hyperlipidemia   . Hypertension   . OA (osteoarthritis)   . OSA (obstructive sleep apnea)    Mild, has  not received CPAP yet  . Prediabetes   . RLS (restless legs syndrome)   . 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 ANKLE FRACTURE SURGERY Right 1991  . APPENDECTOMY    . ATRIAL FIBRILLATION ABLATION N/A 03/31/2018   Procedure: ATRIAL FIBRILLATION ABLATION;  Surgeon: [name], MD;  Location: [facility];  Service: Cardiovascular;  Laterality: N/A;  . CARDIAC ELECTROPHYSIOLOGY MAPPING AND ABLATION    . COLONOSCOPY W/ BIOPSIES  2017   x7  . CORONARY ANGIOPLASTY WITH STENT PLACEMENT    . CORONARY ARTERY BYPASS GRAFT    . FINGER SURGERY  04/2018   Small finger left hand  . implantable loop recorder placement  07/02/2019   Medtronic Reveal [place] model [id number] ([place] [id number]) implanted in office by Dr Rayann Heman  . TEE WITHOUT CARDIOVERSION N/A 03/30/2018   Procedure: TRANSESOPHAGEAL ECHOCARDIOGRAM (TEE);  Surgeon: [name], MD;  Location: [facility];  Service: Cardiovascular;  Laterality: N/A;  . TOTAL HIP ARTHROPLASTY Left   . TOTAL HIP ARTHROPLASTY Right 09/12/2018   Procedure: TOTAL HIP ARTHROPLASTY ANTERIOR APPROACH;  Surgeon: [name], MD;  Location: [facility];  Service: Orthopedics;  Laterality: Right;  70 mins    Social History:  reports that he quit smoking about 42 years ago. His smoking use included cigarettes. He has a 7.00 pack-year smoking history. He has never used smokeless tobacco. He reports current alcohol use. He reports that he does not use drugs.  Allergies  Allergen Reactions  . Amoxicillin Rash and Other (See Comments)    * SEVERE RASH IN GROIN AREA Has patient had a PCN reaction causing immediate rash, facial/tongue/throat swelling, SOB or lightheadedness with hypotension: no Has patient had a PCN reaction causing severe rash involving mucus membranes or skin necrosis: no Has patient had a PCN reaction that required hospitalization: no Has patient had a PCN reaction occurring within the  last 10 years: yes If all of the above answers are "NO", then may proceed with Cephalosporin use.   . Augmentin [Amoxicillin-Pot Clavulanate] Rash and Other (See Comments)    * SEVERE RASH IN GROIN AREA  . Azithromycin Rash and Other (See Comments)    * SEVERE RASH IN GROIN AREA  . Clindamycin/Lincomycin Rash  . Keflex [Cephalexin] Rash    Family History  Problem Relation Age of Onset  . Colon cancer Mother   . Heart disease Paternal Grandmother   . Heart disease Paternal Grandfather      Prior to Admission medications   Medication Sig Start Date End Date Taking? Authorizing Provider  allopurinol (ZYLOPRIM) 300 MG tablet Take 1 tablet (300 mg total) by mouth daily. 09/24/19   [name], Alinda Sierras, MD  Coenzyme Q10 (CO Q-10) 100 MG CAPS Take 100 mg by mouth daily.     [provider]  diphenhydrAMINE HCl, Sleep, (  ZZZQUIL) 25 MG CAPS Take 25 mg by mouth at bedtime as needed (for sleep).    [provider]  dofetilide (TIKOSYN) 125 MCG capsule Take 1 capsule (125 mcg total) by mouth 2 (two) times daily. May have 90 day refills if patient prefers 10/01/19   [name], MD  econazole nitrate 1 % cream Apply 1 application topically every other day.     [provider]  fenofibrate micronized (LOFIBRA) 200 MG capsule Take 1 capsule (200 mg total) by mouth daily before breakfast. 08/09/19   Turner, Eber Hong, MD  fluticasone (FLONASE) 50 MCG/ACT nasal spray Place 1 spray into both nostrils daily.  05/12/13   [provider]  Fluticasone-Umeclidin-Vilant (TRELEGY ELLIPTA) 100-62.5-25 MCG/INH AEPB Inhale 1 puff into the lungs daily. 07/13/19   [name], MD  gabapentin (NEURONTIN) 300 MG capsule Take two to three tablets at night as needed for restless leg symptoms 05/29/19   [name], MD  glucose blood (ONETOUCH VERIO) test strip Test once daily.  Dx E11.9 01/26/18   [name], MD  hydrochlorothiazide (HYDRODIURIL) 25 MG tablet Take 1 tablet  (25 mg total) by mouth daily. 08/01/18   [name], Alinda Sierras, MD  Icosapent Ethyl (VASCEPA) 1 g CAPS Take 2 capsules by mouth 2 (two) times daily.     [provider]  indomethacin (INDOCIN) 25 MG capsule TK 1 C PO BID 04/26/19   [provider]  JARDIANCE 25 MG TABS tablet TAKE 1 TABLET BY MOUTH DAILY 10/18/19   [name], Alinda Sierras, MD  LORazepam (ATIVAN) 0.5 MG tablet Take 1 tablet (0.5 mg total) by mouth every 8 (eight) hours as needed for anxiety. 06/25/19   [name], Alinda Sierras, MD  losartan (COZAAR) 100 MG tablet TAKE 1 TABLET(100 MG) BY MOUTH DAILY 04/10/19   [name], Alinda Sierras, MD  metFORMIN (GLUCOPHAGE) 1000 MG tablet TAKE 1 TABLET BY MOUTH TWICE DAILY WITH A MEAL 04/30/19   [name], Alinda Sierras, MD  methocarbamol (ROBAXIN) 500 MG tablet TK 1 T PO Q 6 H PRF SPASMS 06/07/19   [provider]  metoprolol tartrate (LOPRESSOR) 100 MG tablet TAKE 1 TABLET(100 MG) BY MOUTH TWICE DAILY 09/21/19   [name], Alinda Sierras, MD  metoprolol tartrate (LOPRESSOR) 50 MG tablet Take 2 tablets (100 mg total) by mouth 2 (two) times daily. 07/02/19   Allred, Jeneen Rinks, MD  montelukast (SINGULAIR) 10 MG tablet TAKE 1 TABLET BY MOUTH AT BEDTIME 04/30/19   [name], Alinda Sierras, MD  Olopatadine HCl 0.2 % SOLN Apply 1 drop to eye daily as needed. Apply to both  eyes    [provider]  potassium chloride SA (K-DUR) 20 MEQ tablet Take  2 tablets in the AM and 1 tablet in the PM 02/15/19   [name], NP  RABEprazole (ACIPHEX) 20 MG tablet Take 1 tablet (20 mg total) by mouth daily. 06/26/19   [name], Alinda Sierras, MD  rosuvastatin (CRESTOR) 40 MG tablet Take 1 tablet (40 mg total) by mouth daily. 06/27/19 09/25/19  Sueanne Margarita, MD  sitaGLIPtin (JANUVIA) 100 MG tablet Take 1 tablet (100 mg total) by mouth daily. 06/25/19   [name], Alinda Sierras, MD  TOUJEO SOLOSTAR 300 UNIT/ML SOPN INJECT 60 UNITS AS DIRECTED DAILY 01/11/19   [name], Alinda Sierras, MD  XARELTO 20 MG TABS tablet TAKE 1 TABLET BY MOUTH DAILY WITH  SUPPER 10/15/19   [name], NP    Physical Exam: Vitals:   10/24/19 2222 10/24/19 2223  10/24/19 2224 10/24/19 2245  BP: (!) 164/84   (!) 160/76  Pulse:  (!) 143 (!) 138 (!) 134  Resp: (!) 26 (!) 28 (!) 25 (!) 23  Temp:      TempSrc:      SpO2:  99% 99% 98%   Constitutional: Resting supine in bed, NAD, calm, comfortable Eyes: PERRL, lids and conjunctivae normal ENMT: Mucous membranes are dry. Posterior pharynx clear of any exudate or lesions.Normal dentition.  Neck: normal, supple, no masses. Respiratory: clear to auscultation bilaterally, no wheezing, no crackles. Normal respiratory effort. No accessory muscle use.  Cardiovascular: Tachycardic, no murmurs / rubs / gallops. No extremity edema. 2+ pedal pulses. Abdomen: no tenderness, no masses palpated. No hepatosplenomegaly. Bowel sounds positive.  Musculoskeletal: no clubbing / cyanosis. No joint deformity upper and lower extremities. Good ROM, no contractures. Normal muscle tone.  Skin: Diaphoretic, flushed, no rashes, lesions, ulcers. No induration Neurologic: Tremulous, CN 2-12 grossly intact. Sensation intact, Strength 5/5 in all 4.  Psychiatric: Alert and oriented x 3. Normal mood.  Denies hallucinations.    Labs on Admission: I have personally reviewed following labs and imaging studies  CBC: Recent Labs  Lab 10/24/19 2024  WBC 9.9  NEUTROABS 8.2*  HGB 12.7*  HCT 40.6  MCV 100.7*  PLT 017   Basic Metabolic Panel: Recent Labs  Lab 10/24/19 2024  NA 138  K 4.1  CL 99  CO2 11*  GLUCOSE 145*  BUN 11  CREATININE 1.22  CALCIUM 8.9   GFR: CrCl cannot be calculated (Unknown ideal weight.). Liver Function Tests: Recent Labs  Lab 10/24/19 2024  AST 43*  ALT 31  ALKPHOS 45  BILITOT 1.3*  PROT 7.8  ALBUMIN 4.3   No results for input(s): LIPASE, AMYLASE in the last 168 hours. No results for input(s): AMMONIA in the last 168 hours. Coagulation Profile: Recent Labs  Lab 10/24/19 2024  INR 1.1    Cardiac Enzymes: No results for input(s): CKTOTAL, CKMB, CKMBINDEX, TROPONINI in the last 168 hours. BNP (last 3 results) No results for input(s): PROBNP in the last 8760 hours. HbA1C: No results for input(s): HGBA1C in the last 72 hours. CBG: No results for input(s): GLUCAP in the last 168 hours. Lipid Profile: No results for input(s): CHOL, HDL, LDLCALC, TRIG, CHOLHDL, LDLDIRECT in the last 72 hours. Thyroid Function Tests: No results for input(s): TSH, T4TOTAL, FREET4, T3FREE, THYROIDAB in the last 72 hours. Anemia Panel: No results for input(s): VITAMINB12, FOLATE, FERRITIN, TIBC, IRON, RETICCTPCT in the last 72 hours. Urine analysis:    Component Value Date/Time   BILIRUBINUR 1+ 08/14/2013 1200   PROTEINUR n 08/14/2013 1200   UROBILINOGEN 0.2 08/14/2013 1200   NITRITE n 08/14/2013 1200   LEUKOCYTESUR Negative 08/14/2013 1200    Radiological Exams on Admission: No results found.  EKG: Independently reviewed. Tachyarrhythmia with PVCs, motion artifact.  Prior EKG showed normal sinus rhythm.  Assessment/Plan Principal Problem:   Alcohol withdrawal (HCC) Active Problems:   CAD (coronary artery disease)   Hypertension associated with diabetes ([place])   Hyperlipidemia associated with type 2 diabetes mellitus (HCC)   COPD (chronic obstructive pulmonary disease) (HCC)   Type 2 diabetes mellitus, controlled (HCC)   Paroxysmal atrial fibrillation (HCC)   Rectal bleeding  WILBURT MESSINA is a 69 y.o. male with medical history significant for CAD s/p CABG, COPD, paroxysmal atrial fibrillation on Xarelto, ascending aortic aneurysm, insulin-dependent type 2 diabetes, hypertension, hyperlipidemia, anxiety/depression, OSA not currently on CPAP, and alcohol use who  is admitted with alcohol withdrawal and report of rectal bleeding.  Alcohol withdrawal: Reports chronic daily alcohol use of 2-4 drinks of scotch per day with last drink around 48 hours prior to admission.  Patient  hypertensive, tachycardic, tachypneic, tremulous, and diaphoretic.  Concern is he is beginning to have withdrawal from alcohol therefore will be admitted for further management. -Continue Ativan per CIWA protocol -Continue multivitamin, folic acid, thiamine -Give additional 1 L fluid bolus followed by maintenance fluids overnight  Rectal bleeding: Reported dark red blood per rectum.  FOBT is positive.  Hemoglobin currently stable at 12.7.  Last colonoscopy in 2017, has history of polyps, questionable colitis, and mild sigmoid diverticulosis. -Hold Xarelto -Monitor hemoglobin, transfuse as needed  Paroxysmal atrial fibrillation: Tachycardic on admission, likely contributed by suspected alcohol withdrawal. -Holding Xarelto as above -Resume home metoprolol and Tikosyn  CAD s/p CABG: Chronic and stable, denies any chest pain.  Resume metoprolol, statin.  Insulin-dependent type 2 diabetes: A1c 7.1% on 09/24/2019.  Will hold home Jardiance, Metformin, Januvia.  Continue reduced home Lantus plus moderate SSI while in hospital.  COPD: Currently stable without wheezing on admission.  Continue home Trelegy and Singulair.  Hypertension: Continue home metoprolol, losartan, HCTZ.  Hyperlipidemia: Continue rosuvastatin.  Anxiety: Takes Ativan 0.5 mg 3 times daily chronically for anxiety.  Reports no missed doses at home.  Continue Ativan per CIWA protocol as above.  DVT prophylaxis: SCDs Code Status: Full code, confirmed with patient Family Communication: Discussed with patient, he has discussed with family Disposition Plan: Likely discharge to home pending clinical progress Consults called: None Admission status: Observation   Zada Finders MD [facility]  If 7PM-7AM, please contact night-coverage [email or web address]  10/25/2019, 12:36 AM

## 2019-10-25 DIAGNOSIS — Z951 Presence of aortocoronary bypass graft: Secondary | ICD-10-CM | POA: Diagnosis not present

## 2019-10-25 DIAGNOSIS — F329 Major depressive disorder, single episode, unspecified: Secondary | ICD-10-CM | POA: Diagnosis present

## 2019-10-25 DIAGNOSIS — K922 Gastrointestinal hemorrhage, unspecified: Secondary | ICD-10-CM | POA: Diagnosis present

## 2019-10-25 DIAGNOSIS — F419 Anxiety disorder, unspecified: Secondary | ICD-10-CM | POA: Diagnosis present

## 2019-10-25 DIAGNOSIS — Z6836 Body mass index (BMI) 36.0-36.9, adult: Secondary | ICD-10-CM | POA: Diagnosis not present

## 2019-10-25 DIAGNOSIS — E669 Obesity, unspecified: Secondary | ICD-10-CM | POA: Diagnosis present

## 2019-10-25 DIAGNOSIS — Z7901 Long term (current) use of anticoagulants: Secondary | ICD-10-CM | POA: Diagnosis not present

## 2019-10-25 DIAGNOSIS — G4733 Obstructive sleep apnea (adult) (pediatric): Secondary | ICD-10-CM | POA: Diagnosis present

## 2019-10-25 DIAGNOSIS — K649 Unspecified hemorrhoids: Secondary | ICD-10-CM | POA: Diagnosis present

## 2019-10-25 DIAGNOSIS — F10239 Alcohol dependence with withdrawal, unspecified: Secondary | ICD-10-CM | POA: Diagnosis present

## 2019-10-25 DIAGNOSIS — F10230 Alcohol dependence with withdrawal, uncomplicated: Secondary | ICD-10-CM | POA: Diagnosis present

## 2019-10-25 DIAGNOSIS — Z20822 Contact with and (suspected) exposure to covid-19: Secondary | ICD-10-CM | POA: Diagnosis present

## 2019-10-25 DIAGNOSIS — Z79899 Other long term (current) drug therapy: Secondary | ICD-10-CM | POA: Diagnosis not present

## 2019-10-25 DIAGNOSIS — I2510 Atherosclerotic heart disease of native coronary artery without angina pectoris: Secondary | ICD-10-CM | POA: Diagnosis present

## 2019-10-25 DIAGNOSIS — E785 Hyperlipidemia, unspecified: Secondary | ICD-10-CM | POA: Diagnosis present

## 2019-10-25 DIAGNOSIS — J441 Chronic obstructive pulmonary disease with (acute) exacerbation: Secondary | ICD-10-CM | POA: Diagnosis not present

## 2019-10-25 DIAGNOSIS — J449 Chronic obstructive pulmonary disease, unspecified: Secondary | ICD-10-CM | POA: Diagnosis present

## 2019-10-25 DIAGNOSIS — Z96643 Presence of artificial hip joint, bilateral: Secondary | ICD-10-CM | POA: Diagnosis present

## 2019-10-25 DIAGNOSIS — E872 Acidosis: Secondary | ICD-10-CM | POA: Diagnosis present

## 2019-10-25 DIAGNOSIS — E1159 Type 2 diabetes mellitus with other circulatory complications: Secondary | ICD-10-CM | POA: Diagnosis not present

## 2019-10-25 DIAGNOSIS — I4819 Other persistent atrial fibrillation: Secondary | ICD-10-CM | POA: Diagnosis present

## 2019-10-25 DIAGNOSIS — Z86010 Personal history of colonic polyps: Secondary | ICD-10-CM | POA: Diagnosis not present

## 2019-10-25 DIAGNOSIS — I10 Essential (primary) hypertension: Secondary | ICD-10-CM

## 2019-10-25 DIAGNOSIS — K625 Hemorrhage of anus and rectum: Secondary | ICD-10-CM | POA: Diagnosis not present

## 2019-10-25 DIAGNOSIS — I480 Paroxysmal atrial fibrillation: Secondary | ICD-10-CM | POA: Diagnosis not present

## 2019-10-25 DIAGNOSIS — E1169 Type 2 diabetes mellitus with other specified complication: Secondary | ICD-10-CM | POA: Diagnosis present

## 2019-10-25 DIAGNOSIS — Z8249 Family history of ischemic heart disease and other diseases of the circulatory system: Secondary | ICD-10-CM | POA: Diagnosis not present

## 2019-10-25 DIAGNOSIS — Z87891 Personal history of nicotine dependence: Secondary | ICD-10-CM | POA: Diagnosis not present

## 2019-10-25 DIAGNOSIS — I152 Hypertension secondary to endocrine disorders: Secondary | ICD-10-CM | POA: Diagnosis present

## 2019-10-25 DIAGNOSIS — I712 Thoracic aortic aneurysm, without rupture: Secondary | ICD-10-CM | POA: Diagnosis present

## 2019-10-25 LAB — URINALYSIS, ROUTINE W REFLEX MICROSCOPIC
Bacteria, UA: NONE SEEN
Bilirubin Urine: NEGATIVE
Glucose, UA: >=500 mg/dL — AB
Ketones, ur: 80 mg/dL — AB
Leukocytes,Ua: NEGATIVE
Nitrite: NEGATIVE
Protein, ur: 30 mg/dL — AB
Specific Gravity, Urine: 1.023 (ref 1.005–1.030)
pH: 5 (ref 5.0–8.0)

## 2019-10-25 LAB — BASIC METABOLIC PANEL
Anion gap: 19 — ABNORMAL HIGH (ref 5–15)
BUN: 8 mg/dL (ref 8–23)
CO2: 14 mmol/L — ABNORMAL LOW (ref 22–32)
Calcium: 8.6 mg/dL — ABNORMAL LOW (ref 8.9–10.3)
Chloride: 105 mmol/L (ref 98–111)
Creatinine, Ser: 1.24 mg/dL (ref 0.61–1.24)
GFR calc Af Amer: >60 mL/min (ref >60)
GFR calc non Af Amer: 59 mL/min — ABNORMAL LOW (ref >60)
Glucose, Bld: 174 mg/dL — ABNORMAL HIGH (ref 70–99)
Potassium: 4.4 mmol/L (ref 3.5–5.1)
Sodium: 138 mmol/L (ref 135–145)

## 2019-10-25 LAB — CBC
HCT: 35.7 % — ABNORMAL LOW (ref 39.0–52.0)
Hemoglobin: 11.6 g/dL — ABNORMAL LOW (ref 13.0–17.0)
MCH: 31.5 pg (ref 26.0–34.0)
MCHC: 32.5 g/dL (ref 30.0–36.0)
MCV: 97 fL (ref 80.0–100.0)
Platelets: 153 10*3/uL (ref 150–400)
RBC: 3.68 MIL/uL — ABNORMAL LOW (ref 4.22–5.81)
RDW: 13.8 % (ref 11.5–15.5)
WBC: 9.2 10*3/uL (ref 4.0–10.5)
nRBC: 0 % (ref 0.0–0.2)

## 2019-10-25 LAB — GLUCOSE, CAPILLARY
Glucose-Capillary: 155 mg/dL — ABNORMAL HIGH (ref 70–99)
Glucose-Capillary: 146 mg/dL — ABNORMAL HIGH (ref 70–99)
Glucose-Capillary: 129 mg/dL — ABNORMAL HIGH (ref 70–99)
Glucose-Capillary: 118 mg/dL — ABNORMAL HIGH (ref 70–99)
Glucose-Capillary: 162 mg/dL — ABNORMAL HIGH (ref 70–99)

## 2019-10-25 LAB — SARS CORONAVIRUS 2 (TAT 6-24 HRS): SARS Coronavirus 2: NEGATIVE

## 2019-10-25 LAB — HIV ANTIBODY (ROUTINE TESTING W REFLEX): HIV Screen 4th Generation wRfx: NONREACTIVE

## 2019-10-25 LAB — MAGNESIUM: Magnesium: 2 mg/dL (ref 1.7–2.4)

## 2019-10-25 LAB — LACTIC ACID, PLASMA
Lactic Acid, Venous: 1.2 mmol/L (ref 0.5–1.9)
Lactic Acid, Venous: 1.4 mmol/L (ref 0.5–1.9)

## 2019-10-25 MED ORDER — FLUTICASONE-UMECLIDIN-VILANT 100-62.5-25 MCG/INH IN AEPB: 1.0000 | INHALATION_SPRAY | Freq: Every day | RESPIRATORY_TRACT | Status: DC

## 2019-10-25 MED ORDER — SODIUM CHLORIDE 0.9% FLUSH
3.0000 mL | Freq: Two times a day (BID) | INTRAVENOUS | Status: DC
  Administered 2019-10-25 – 2019-10-27 (×6): 3 mL via INTRAVENOUS

## 2019-10-25 MED ORDER — THIAMINE HCL 100 MG/ML IJ SOLN
Freq: Once | INTRAVENOUS | Status: AC
  Filled 2019-10-25: qty 1000

## 2019-10-25 MED ORDER — UMECLIDINIUM BROMIDE 62.5 MCG/INH IN AEPB
1.0000 | INHALATION_SPRAY | Freq: Every day | RESPIRATORY_TRACT | Status: DC
  Administered 2019-10-26 – 2019-10-27 (×2): 1 via RESPIRATORY_TRACT
  Filled 2019-10-25 (×2): qty 7

## 2019-10-25 MED ORDER — MONTELUKAST SODIUM 10 MG PO TABS
10.0000 mg | ORAL_TABLET | Freq: Every day | ORAL | Status: DC
  Administered 2019-10-25 – 2019-10-26 (×3): 10 mg via ORAL
  Filled 2019-10-25 (×6): qty 1

## 2019-10-25 MED ORDER — ACETAMINOPHEN 650 MG RE SUPP: 650.0000 mg | Freq: Four times a day (QID) | RECTAL | Status: DC | PRN

## 2019-10-25 MED ORDER — LOSARTAN POTASSIUM 50 MG PO TABS
100.0000 mg | ORAL_TABLET | Freq: Every day | ORAL | Status: DC
  Administered 2019-10-25 – 2019-10-27 (×3): 100 mg via ORAL
  Filled 2019-10-25 (×3): qty 2

## 2019-10-25 MED ORDER — LACTATED RINGERS IV BOLUS
1000.0000 mL | Freq: Once | INTRAVENOUS | Status: AC
  Administered 2019-10-25: 1000 mL via INTRAVENOUS

## 2019-10-25 MED ORDER — GABAPENTIN 300 MG PO CAPS
600.0000 mg | ORAL_CAPSULE | Freq: Every day | ORAL | Status: DC | PRN
  Filled 2019-10-25: qty 2

## 2019-10-25 MED ORDER — ACETAMINOPHEN 325 MG PO TABS
650.0000 mg | ORAL_TABLET | Freq: Four times a day (QID) | ORAL | Status: DC | PRN
  Administered 2019-10-27: 650 mg via ORAL
  Filled 2019-10-25: qty 2

## 2019-10-25 MED ORDER — METOPROLOL TARTRATE 100 MG PO TABS
100.0000 mg | ORAL_TABLET | Freq: Two times a day (BID) | ORAL | Status: DC
  Administered 2019-10-25 – 2019-10-27 (×6): 100 mg via ORAL
  Filled 2019-10-25 (×6): qty 1

## 2019-10-25 MED ORDER — ONDANSETRON HCL 4 MG PO TABS: 4.0000 mg | ORAL_TABLET | Freq: Four times a day (QID) | ORAL | Status: DC | PRN

## 2019-10-25 MED ORDER — HYDROCHLOROTHIAZIDE 25 MG PO TABS
25.0000 mg | ORAL_TABLET | Freq: Every day | ORAL | Status: DC
  Administered 2019-10-25: 25 mg via ORAL
  Filled 2019-10-25: qty 1

## 2019-10-25 MED ORDER — DOFETILIDE 125 MCG PO CAPS
125.0000 ug | ORAL_CAPSULE | Freq: Two times a day (BID) | ORAL | Status: DC
  Administered 2019-10-25 – 2019-10-27 (×5): 125 ug via ORAL
  Filled 2019-10-25 (×5): qty 1

## 2019-10-25 MED ORDER — ONDANSETRON HCL 4 MG/2ML IJ SOLN: 4.0000 mg | Freq: Four times a day (QID) | INTRAMUSCULAR | Status: DC | PRN

## 2019-10-25 MED ORDER — DOFETILIDE 125 MCG PO CAPS: 125.0000 ug | ORAL_CAPSULE | Freq: Two times a day (BID) | ORAL | Status: DC

## 2019-10-25 MED ORDER — INSULIN GLARGINE 100 UNIT/ML ~~LOC~~ SOLN
40.0000 [IU] | Freq: Every day | SUBCUTANEOUS | Status: DC
  Administered 2019-10-25 – 2019-10-26 (×3): 40 [IU] via SUBCUTANEOUS
  Filled 2019-10-25 (×5): qty 0.4

## 2019-10-25 MED ORDER — INSULIN ASPART 100 UNIT/ML ~~LOC~~ SOLN
0.0000 [IU] | Freq: Three times a day (TID) | SUBCUTANEOUS | Status: DC
  Administered 2019-10-25 – 2019-10-27 (×7): 2 [IU] via SUBCUTANEOUS

## 2019-10-25 MED ORDER — LACTATED RINGERS IV SOLN: INTRAVENOUS | Status: AC

## 2019-10-25 MED ORDER — THIAMINE HCL 100 MG PO TABS
100.0000 mg | ORAL_TABLET | Freq: Every day | ORAL | Status: DC
  Administered 2019-10-25 – 2019-10-27 (×3): 100 mg via ORAL
  Filled 2019-10-25 (×3): qty 1

## 2019-10-25 MED ORDER — FOLIC ACID 1 MG PO TABS
1.0000 mg | ORAL_TABLET | Freq: Every day | ORAL | Status: DC
  Administered 2019-10-25 – 2019-10-27 (×3): 1 mg via ORAL
  Filled 2019-10-25 (×3): qty 1

## 2019-10-25 MED ORDER — HYDROCORTISONE ACETATE 25 MG RE SUPP
25.0000 mg | Freq: Two times a day (BID) | RECTAL | Status: DC
  Administered 2019-10-25 – 2019-10-27 (×5): 25 mg via RECTAL
  Filled 2019-10-25 (×5): qty 1

## 2019-10-25 MED ORDER — ADULT MULTIVITAMIN W/MINERALS CH
1.0000 | ORAL_TABLET | Freq: Every day | ORAL | Status: DC
  Administered 2019-10-25 – 2019-10-27 (×3): 1 via ORAL
  Filled 2019-10-25 (×3): qty 1

## 2019-10-25 MED ORDER — ROSUVASTATIN CALCIUM 20 MG PO TABS
40.0000 mg | ORAL_TABLET | Freq: Every day | ORAL | Status: DC
  Administered 2019-10-25 – 2019-10-27 (×3): 40 mg via ORAL
  Filled 2019-10-25 (×3): qty 2

## 2019-10-25 MED ORDER — FLUTICASONE FUROATE-VILANTEROL 100-25 MCG/INH IN AEPB
1.0000 | INHALATION_SPRAY | Freq: Every day | RESPIRATORY_TRACT | Status: DC
  Administered 2019-10-26 – 2019-10-27 (×2): 1 via RESPIRATORY_TRACT
  Filled 2019-10-25 (×2): qty 28

## 2019-10-25 NOTE — TOC Benefit Eligibility Note (Signed)
Transition of Care [facility]) Benefit Eligibility Note    Patient Details  Name: Adam [name] MRN: [id number] Date of Birth: 1951-06-24   Medication/Dose: Dosetilide 125 mg bid/ 261.73 30 day supply for  576m & 2574mbid  Covered?: Yes  Tier: Other(4)  Prescription Coverage [facility] Person/Company/Phone Number:: [facility] [employer] [phone]-Pay: 120.60 for a 30 day supply  Prior Approval: No  Deductible: Met  Additional Notes: Tikosyn  500, 250 and 125 mg bid not on the formlary and not covered    IrOrbie Pyohone Number: 10/25/2019, 3:22 PM

## 2019-10-25 NOTE — Plan of Care (Signed)
  Problem: Education: Goal: Knowledge of General Education information will improve Description Including pain rating scale, medication(s)/side effects and non-pharmacologic comfort measures Outcome: Progressing   Problem: Health Behavior/Discharge Planning: Goal: Ability to manage health-related needs will improve Outcome: Progressing   

## 2019-10-25 NOTE — Consult Note (Addendum)
Cardiology Consultation:   Patient ID: [name] MRN: [id number]; DOB: 06-Aug-1951  Admit date: 10/24/2019 Date of Consult: 10/25/2019  Primary Care Provider: [name], MD Primary Cardiologist: Adam Standing, MD  >> Dr. Martinique Primary Electrophysiologist:  Adam Grayer, MD    Patient Profile:   Adam [name] is a 69 y.o. male with a hx of HTN, HLD, CAD (CABG 2011 with rports of numerous prior stents), COPD, OSA (intolerant of CPAP), DM, obesity, and persistent AFib), ascending Ao aneurysm, + ETOH (3-4 drinks daily)  who is being seen today for the evaluation of AAD/Tikosyn management at the request of Dr. Marylyn [name].  AFib Hx PVI ablation 03/31/2018, Adam [name] Initial diagnosis appears [name]-op 2011 after his CABG though  Had long hiatus without any until 2019) AAD Tikosyn started 12/13/2017, is current Pt has stated he was on amiodarone back in 2011 briefly, turned his skin purple  MDT ILR implanted 07/02/19 for AFib surveillence  History of Present Illness:   Adam [name] last saw Dr. Rayann [name] via tele-health visit 10/01/2019.   Noted 10 day gap in his Tikosyn back in October it seems 2/2 pharmacy issue though was back on it.  He also noted noncompliance with recommendations to pursue oral appliance for his OSA, and intolerant of CPAP.  Having difficulty with lifestyle recommendations with back pain.  ALl in all, though from Afib perspective doing well, AF burden 0.2% Planned to have Tikosyn surveillance via Afib clinic Q 3 mo.   He was admitted to [name] yesterday 2/2 LGIB, rectal bleeding.  No other reports of bleeding or signs of bleeding.  Note still drinking 2-4 scotch drinks daily, none in last 24 hours with some aciousness, tremulousness. He was admitted for further evaluation of his rectal bleeding, there was also concerns of possible early withdrawal and started on CIWA precautions.   EP is asked to the case to aid in Tikosyn management. Pharmacy noted the patient  had HCTZ, pt apparently reported last Tikosyn dose was 10/22/19 at home, and perhaps prolonged QT for restart and in d/w attending prompted our consult.   LABS today K+ 4.4 Mag 2.0 BUN/Creat 8/1.24,  (current calc crCl is 92) baseline looks 0.8-0.9 WBC 9.2 H/H 12/40 >> 11/35 Plts 153  COVID negative  He reports 2 days without any of his medicines at home, generally feeling sick to his stomach, came yesterday with the development of bloody stools.  He feels a little better today then yesterday, but remains generally nauseous and unwell.. No CP, palpitations or SOB  Heart Pathway Score:     Past Medical History:  Diagnosis Date  . Allergy   . Anxiety    history of PTSD following CABG  . Ascending aortic aneurysm ([facility]) 01/31/2018   43 x 42 mm, pt unaware  . Asthma   . Cardiomegaly 10/17/2017  . Colitis- colonoscopy 2014 07/13/2015  . COPD (chronic obstructive pulmonary disease) ([place])   . Coronary artery disease    x 6  . Depression   . Diabetes mellitus without complication ([place])   . Family history of polyps in the colon   . Finger dislocation    Left pinkie  . GERD (gastroesophageal reflux disease)   . Gout   . H/O atrial fibrillation without current medication    following CABG with no documented episodes since then.  . Heart palpitations   . Hx of adenomatous colonic polyps 08/12/2010  . Hyperlipidemia   . Hypertension   . OA (osteoarthritis)   .  OSA (obstructive sleep apnea)    Mild, has not received CPAP yet  . Prediabetes   . RLS (restless legs syndrome)   . 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 ANKLE FRACTURE SURGERY Right 1991  . APPENDECTOMY    . ATRIAL FIBRILLATION ABLATION N/A 03/31/2018   Procedure: ATRIAL FIBRILLATION ABLATION;  Surgeon: Adam Grayer, MD;  Location: [facility];  Service: Cardiovascular;  Laterality: N/A;  . CARDIAC ELECTROPHYSIOLOGY MAPPING AND ABLATION    .  COLONOSCOPY W/ BIOPSIES  2017   x7  . CORONARY ANGIOPLASTY WITH STENT PLACEMENT    . CORONARY ARTERY BYPASS GRAFT    . FINGER SURGERY  04/2018   Small finger left hand  . implantable loop recorder placement  07/02/2019   Medtronic Reveal [employer] [place] model [id number] ([id number] [id number]) implanted in office by Dr Adam [name]  . TEE WITHOUT CARDIOVERSION N/A 03/30/2018   Procedure: TRANSESOPHAGEAL ECHOCARDIOGRAM (TEE);  Surgeon: [name], MD;  Location: [facility];  Service: Cardiovascular;  Laterality: N/A;  . TOTAL HIP ARTHROPLASTY Left   . TOTAL HIP ARTHROPLASTY Right 09/12/2018   Procedure: TOTAL HIP ARTHROPLASTY ANTERIOR APPROACH;  Surgeon: [name], MD;  Location: [facility];  Service: Orthopedics;  Laterality: Right;  70 mins     Home Medications:  Prior to Admission medications   Medication Sig Start Date End Date Taking? Authorizing Provider  allopurinol (ZYLOPRIM) 300 MG tablet Take 1 tablet (300 mg total) by mouth daily. 09/24/19  Yes [name], Alinda Sierras, MD  Coenzyme Q10 (CO Q-10) 100 MG CAPS Take 100 mg by mouth daily.    Yes [provider]  diphenhydrAMINE HCl, Sleep, (ZZZQUIL) 25 MG CAPS Take 25 mg by mouth at bedtime as needed (for sleep).   Yes [provider]  dofetilide (TIKOSYN) 125 MCG capsule Take 1 capsule (125 mcg total) by mouth 2 (two) times daily. May have 90 day refills if patient prefers 10/01/19  Yes Malaney Mcbean, Jeneen Rinks, MD  fenofibrate micronized (LOFIBRA) 200 MG capsule Take 1 capsule (200 mg total) by mouth daily before breakfast. 08/09/19  Yes Turner, Eber Hong, MD  fluticasone (FLONASE) 50 MCG/ACT nasal spray Place 1 spray into both nostrils daily.  05/12/13  Yes [provider]  Fluticasone-Umeclidin-Vilant (TRELEGY ELLIPTA) 100-62.5-25 MCG/INH AEPB Inhale 1 puff into the lungs daily. 07/13/19  Yes [name], MD  gabapentin (NEURONTIN) 300 MG capsule Take two to three tablets at night as needed for restless leg symptoms Patient taking differently:  Take 600-900 mg by mouth at bedtime as needed (for restless leg symptoms).  05/29/19  Yes [name], Alinda Sierras, MD  Icosapent Ethyl (VASCEPA) 1 g CAPS Take 2 capsules by mouth 2 (two) times daily.    Yes [provider]  JARDIANCE 25 MG TABS tablet TAKE 1 TABLET BY MOUTH DAILY Patient taking differently: Take 25 mg by mouth daily.  10/18/19  Yes [name], Alinda Sierras, MD  LORazepam (ATIVAN) 0.5 MG tablet Take 1 tablet (0.5 mg total) by mouth every 8 (eight) hours as needed for anxiety. 06/25/19  Yes [name], Alinda Sierras, MD  losartan-hydrochlorothiazide (HYZAAR) 100-25 MG tablet Take 1 tablet by mouth daily. 10/15/19  Yes [provider]  metFORMIN (GLUCOPHAGE) 1000 MG tablet TAKE 1 TABLET BY MOUTH TWICE DAILY WITH A MEAL Patient taking differently: Take 1,000 mg by mouth 2 (two) times daily with a meal.  04/30/19  Yes [name], Alinda Sierras, MD  metoprolol  tartrate (LOPRESSOR) 100 MG tablet TAKE 1 TABLET(100 MG) BY MOUTH TWICE DAILY Patient taking differently: Take 50 mg by mouth 2 (two) times daily.  09/21/19  Yes [name], Alinda Sierras, MD  montelukast (SINGULAIR) 10 MG tablet TAKE 1 TABLET BY MOUTH AT BEDTIME Patient taking differently: Take 10 mg by mouth at bedtime.  04/30/19  Yes [name], Alinda Sierras, MD  potassium chloride SA (K-DUR) 20 MEQ tablet Take  2 tablets in the AM and 1 tablet in the PM Patient taking differently: Take 20-40 mEq by mouth See admin instructions. Take  2 tablets every morning and 1 tablet every evening 02/15/19  Yes [name], NP  RABEprazole (ACIPHEX) 20 MG tablet Take 1 tablet (20 mg total) by mouth daily. 06/26/19  Yes [name], Alinda Sierras, MD  rosuvastatin (CRESTOR) 40 MG tablet Take 1 tablet (40 mg total) by mouth daily. 06/27/19 10/24/28 Yes Turner, Eber Hong, MD  sitaGLIPtin (JANUVIA) 100 MG tablet Take 1 tablet (100 mg total) by mouth daily. 06/25/19  Yes [name], Alinda Sierras, MD  TOUJEO SOLOSTAR 300 UNIT/ML SOPN INJECT 60 UNITS AS DIRECTED DAILY Patient taking  differently: Take 60 Units by mouth daily at 12 noon.  01/11/19  Yes [name], Alinda Sierras, MD  XARELTO 20 MG TABS tablet TAKE 1 TABLET BY MOUTH DAILY WITH SUPPER Patient taking differently: Take 20 mg by mouth daily with supper.  10/15/19  Yes [name], NP  glucose blood (ONETOUCH VERIO) test strip Test once daily.  Dx E11.9 01/26/18   Adam Post, MD    Inpatient Medications: Scheduled Meds: . dofetilide  125 mcg Oral BID  . fluticasone furoate-vilanterol  1 puff Inhalation Daily   And  . umeclidinium bromide  1 puff Inhalation Daily  . folic acid  1 mg Oral Daily  . insulin aspart  0-15 Units Subcutaneous TID WC  . insulin glargine  40 Units Subcutaneous QHS  . LORazepam  0-4 mg Intravenous Q6H   Or  . LORazepam  0-4 mg Oral Q6H  . [START ON 10/27/2019] LORazepam  0-4 mg Intravenous Q12H   Or  . [START ON 10/27/2019] LORazepam  0-4 mg Oral Q12H  . losartan  100 mg Oral Daily  . metoprolol tartrate  100 mg Oral BID  . montelukast  10 mg Oral QHS  . multivitamin with minerals  1 tablet Oral Daily  . rosuvastatin  40 mg Oral Daily  . sodium chloride flush  3 mL Intravenous Q12H  . thiamine  100 mg Oral Daily   Continuous Infusions: . lactated ringers 125 mL/hr at 10/25/19 0421   PRN Meds: acetaminophen **OR** acetaminophen, gabapentin, ondansetron **OR** ondansetron (ZOFRAN) IV  Allergies:    Allergies  Allergen Reactions  . Amoxicillin Rash and Other (See Comments)    * SEVERE RASH IN GROIN AREA Has patient had a PCN reaction causing immediate rash, facial/tongue/throat swelling, SOB or lightheadedness with hypotension: no Has patient had a PCN reaction causing severe rash involving mucus membranes or skin necrosis: no Has patient had a PCN reaction that required hospitalization: no Has patient had a PCN reaction occurring within the last 10 years: yes If all of the above answers are "NO", then may proceed with Cephalosporin use.   . Augmentin [Amoxicillin-Pot  Clavulanate] Rash and Other (See Comments)    * SEVERE RASH IN GROIN AREA  . Azithromycin Rash and Other (See Comments)    * SEVERE RASH IN GROIN AREA  . Clindamycin/Lincomycin Rash  . Keflex [  Cephalexin] Rash    Social History:   Social History   Socioeconomic History  . Marital status: Married    Spouse name: Not on file  . Number of children: Not on file  . Years of education: Not on file  . Highest education level: Not on file  Occupational History  . Occupation: retired  Tobacco Use  . Smoking status: Former Smoker    Packs/day: 1.00    Years: 7.00    Pack years: 7.00    Types: Cigarettes    Quit date: 10/04/1977    Years since quitting: 42.0  . Smokeless tobacco: Never Used  . Tobacco comment: never smoked over 1 pack   Substance and Sexual Activity  . Alcohol use: Yes    Comment: stopped wine and now drinks 3 to 4 scotches   . Drug use: No    Comment: Smoked Marijuana back in 1970s  . Sexual activity: Yes  Other Topics Concern  . 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 Difficulty of Paying Living Expenses: Not on file  Food Insecurity:   . Worried About Charity fundraiser in the Last Year: Not on file  . Ran Out of Food in the Last Year: Not on file  Transportation Needs:   . Lack of Transportation (Medical): Not on file  . Lack of Transportation (Non-Medical): Not on file  Physical Activity:   . Days of Exercise per Week: Not on file  . Minutes of Exercise per Session: Not on file  Stress:   . Feeling of Stress : Not on file  Social Connections:   . Frequency of Communication with Friends and Family: Not on file  . Frequency of Social Gatherings with Friends and Family: Not on file  . Attends Religious Services: Not on file  . Active Member of Clubs or  Organizations: Not on file  . Attends Archivist Meetings: Not on file  . Marital Status: Not on file  Intimate Partner Violence:   . Fear of Current or Ex-Partner: Not on file  . Emotionally Abused: Not on file  . Physically Abused: Not on file  . Sexually Abused: Not on file    Family History:   Family History  Problem Relation Age of Onset  . Colon cancer Mother   . Heart disease Paternal Grandmother   . Heart disease Paternal Grandfather      ROS:  Please see the history of present illness.  All other ROS reviewed and negative.     Physical Exam/Data:   Vitals:   10/25/19 0155 10/25/19 0211 10/25/19 0358 10/25/19 0759  BP:  (!) 158/85 121/90 130/83  Pulse:  (!) 126 100 97  Resp:  20  20  Temp:  97.8 F (36.6 C) 97.9 F (36.6 C) 97.6 F (36.4 C)  TempSrc:  Oral Oral Oral  SpO2:  100% 100% 98%  Weight: 115.9 kg     Height: 5\' 10"  (1.778 m)       Intake/Output Summary (Last 24 hours) at 10/25/2019 1024 Last data filed at 10/25/2019 0905 Gross per 24 hour  Intake 2004.92 ml  Output 200 ml  Net 1804.92 ml   Last 3 Weights 10/25/2019 10/01/2019 09/24/2019  Weight (lbs) 255 lb  9.6 oz 260 lb 259 lb 12.8 oz  Weight (kg) 115.939 kg 117.935 kg 117.845 kg     Body mass index is 36.67 kg/m.  General:  Well nourished, well developed, in no acute distress HEENT: normal Lymph: no adenopathy Neck: no JVD Endocrine:  No thryomegaly Vascular: No carotid bruits  Cardiac:  RRR; no murmurs, gallops or rubs Lungs:  CTA b/l, no wheezing, rhonchi or rales  Abd: soft, nontender to light palp  Ext: no edema Musculoskeletal:  No deformities, abrasions to knuckles L hand Skin: warm and dry  Neuro:  No gross focal abnormalities noted Psych:  Normal affect   EKG:  The EKG was personally reviewed and demonstrates:    #1 rate 144, looks like a long RP tachycardia, artifact, suspect ST, PVCs #2 suspect ST 126bpm  Telemetry:  Telemetry was personally reviewed and  demonstrates:    SR/ST, 90's currently  Relevant CV Studies:   10/31/2017 EPS/Ablation CONCLUSIONS: 1. Atrial fibrillation upon presentation.   2. Intracardiac echo reveals a moderate sized left atrium with four separate pulmonary veins without evidence of pulmonary vein stenosis. 3. Successful electrical isolation and anatomical encircling of all four pulmonary veins with radiofrequency current.  A WACA approach was used 3. Additional left atrial ablation was performed with a standard box lesion created along the posterior wall of the left atrium 4. Atrial fibrillation successfully cardioverted to sinus rhythm. 5. No early apparent complications.   10/30/2017: TEE Study Conclusions - Left ventricle: The cavity size was normal. Wall thickness was   normal. Systolic function was normal. The estimated ejection   fraction was in the range of 55% to 60%. Wall motion was normal;   there were no regional wall motion abnormalities. - Mitral valve: Mildly calcified annulus. - Left atrium: No evidence of thrombus in the atrial cavity or   appendage. No spontaneous echo contrast was observed. The   appendage was morphologically a left appendage, multilobulated,   and of normal size. Emptying velocity was mildly reduced. - Right atrium: No evidence of thrombus in the atrial cavity or   appendage. - Atrial septum: There was a very small patent foramen ovale.   Doppler showed a trivial left-to-right shunt, in the baseline   state.   Laboratory Data:  High Sensitivity Troponin:  No results for input(s): TROPONINIHS in the last 720 hours.   Chemistry Recent Labs  Lab 10/24/19 2024 10/25/19 0433  NA 138 138  K 4.1 4.4  CL 99 105  CO2 11* 14*  GLUCOSE 145* 174*  BUN 11 8  CREATININE 1.22 1.24  CALCIUM 8.9 8.6*  GFRNONAA >60 59*  GFRAA >60 >60  ANIONGAP 28* 19*    Recent Labs  Lab 10/24/19 2024  PROT 7.8  ALBUMIN 4.3  AST 43*  ALT 31  ALKPHOS 45  BILITOT 1.3*    Hematology Recent Labs  Lab 10/24/19 2024 10/25/19 0433  WBC 9.9 9.2  RBC 4.03* 3.68*  HGB 12.7* 11.6*  HCT 40.6 35.7*  MCV 100.7* 97.0  MCH 31.5 31.5  MCHC 31.3 32.5  RDW 13.5 13.8  PLT 172 153   BNPNo results for input(s): BNP, PROBNP in the last 168 hours.  DDimer No results for input(s): DDIMER in the last 168 hours.   Radiology/Studies:  No results found. {    Assessment and Plan:   1. Persistent AFib     CHA2DS2Vasc is 3, on Xarelto outpatient (held given bleeding presentation)     Electrolytes are OK, Calc  CrCl is acceptable for his dose     He is in SR     EKGs reviewed with Dr. Rayann [name]     OK with resumption of his Tikosyn at his home dose of 17mcg BID     QT has been stable out patient       Last ILR transmission 10/08/19 with 0.3% AFib burden and at least some of this felt to actually be SR/PACs, not AFib   STOP HCTZ here and upon discharge Dr.  Rayann [name] has seen and examined the patient, discussed importance of not resuming his HCTZ and his losartan needs to be without this component.  Pt stated understanding   Resume Xarelto when cleared from medical/bleeding perspective  For questions or updates, please contact [facility] Please consult [email or web address] for contact info under     Signed, Baldwin Jamaica, PA-C  10/25/2019 10:24 AM   I have seen, examined the patient, and reviewed the above assessment and plan.  Changes to above are made where necessary.  On exam, RRR.  The patient is well known to me.  Recently, compliance has been an issue.  He now presents with GI bleeding.  Continue to hold xarelto at discharge.  I will follow his AF burden by loop recorder remotely and consider down the road if his afib burden warrants restarting anticoagulation.  Resume previous home tikosyn regimen.  STOP hctz which is contraindicated with tikosyn.  EP to follow along as needed.  Co Sign: Adam Grayer, MD 10/25/2019 8:27 PM

## 2019-10-25 NOTE — Progress Notes (Signed)
Marland Kitchen  PROGRESS NOTE    SHAQUON MCCLENNEY  [id number] DOB: Dec 19, 1950 DOA: 10/24/2019 PCP: [name], MD   Brief Narrative:   JIRAH DOEGE is a 69 y.o. male with medical history significant for CAD s/p CABG, COPD, paroxysmal atrial fibrillation on Xarelto, ascending aortic aneurysm, insulin-dependent type 2 diabetes, hypertension, hyperlipidemia, anxiety/depression, OSA not currently on CPAP, and alcohol use who presents to the ED for evaluation of rectal bleeding  10/25/19: No bleeding noted ON. Hgb is stable. Anusol for hemorrhoids. Continue banana bag. Vitals are good. Appreciate EP assistance with tikosyn.     Assessment & Plan:   Principal Problem:   Alcohol withdrawal ([place]) Active Problems:   CAD (coronary artery disease)   Hypertension associated with diabetes ([place])   Hyperlipidemia associated with type 2 diabetes mellitus (HCC)   COPD (chronic obstructive pulmonary disease) (HCC)   Type 2 diabetes mellitus, controlled (HCC)   Paroxysmal atrial fibrillation (HCC)   Rectal bleeding  Alcohol withdrawal:     - Reports chronic daily alcohol use of 2-4 drinks of scotch per day with last drink around 48 hours prior to admission.       - Patient hypertensive, tachycardic, tachypneic, tremulous, and diaphoretic.  Concern is he is beginning to have withdrawal from alcohol therefore will be admitted for further management.     - continue Ativan per CIWA protocol     - continue multivitamin, folic acid, thiamine     - give banana bag today; vitals look ok, not tremulous, denies hallucinations/seizure-like activity  Rectal bleeding:     - Reported dark red blood per rectum.       - FOBT is positive.  Hemoglobin currently stable at 12.7.  Last colonoscopy in 2017, has history of polyps, questionable colitis, and mild sigmoid diverticulosis.     - Hold Xarelto     - Hgb is stable this AM; monitor     - anusol for hemorrhoids  Paroxysmal atrial fibrillation:     -  Tachycardic on admission, likely contributed by suspected alcohol withdrawal.     - Holding Xarelto as above     - Resume home metoprolol and hold HCTZ     - EP has reviewed, ok to resume tikosyn at home dose  CAD s/p CABG:     - Chronic and stable, denies any chest pain.       - Resume metoprolol, statin.  Insulin-dependent type 2 diabetes:     - A1c 7.1% on 09/24/2019     - hold home Jardiance, Metformin, Januvia.       - continue reduced home Lantus plus moderate SSI while in hospital.     - glucose looks ok  COPD:     - currently stable without wheezing on admission.       - continue home Trelegy and Singulair.  Hypertension:     - Continue home metoprolol, losartan     - d/c HCTZ and he should not be on this meds at discharge either  Hyperlipidemia:     - continue rosuvastatin.  Anxiety:     - takes Ativan 0.5 mg 3 times daily chronically for anxiety.     - Continue Ativan per CIWA protocol as above.  DVT prophylaxis: SCDs Code Status: FULL Family Communication: None at bedside   Disposition Plan: Needs stabilization on his [facility]. Watch for withdrawal  Consultants:  EP  ROS:  Denies CP, N, V, ab pain, D . Remainder 10-pt ROS is  negative for all not previously mentioned.  Subjective: "I drink red label johnny walker."  Objective: Vitals:   10/25/19 0211 10/25/19 0358 10/25/19 0759 10/25/19 1209  BP: (!) 158/85 121/90 130/83 122/85  Pulse: (!) 126 100 97 83  Resp: 20  20 20   Temp: 97.8 F (36.6 C) 97.9 F (36.6 C) 97.6 F (36.4 C) 98.1 F (36.7 C)  TempSrc: Oral Oral Oral Oral  SpO2: 100% 100% 98% 98%  Weight:      Height:        Intake/Output Summary (Last 24 hours) at 10/25/2019 1552 Last data filed at 10/25/2019 1254 Gross per 24 hour  Intake 2244.92 ml  Output 1000 ml  Net 1244.92 ml   Filed Weights   10/25/19 0155  Weight: 115.9 kg    Examination:  General: 69 y.o. male resting in bed in NAD Cardiovascular: RRR, +S1, S2, no m/g/r,  equal pulses throughout Respiratory: CTABL, no w/r/r, normal WOB GI: BS+, NDNT, no masses noted, no organomegaly noted MSK: No e/c/c Neuro: A&O x 3, no focal deficits Psyc: Appropriate interaction and affect, calm/cooperative   Data Reviewed: I have personally reviewed following labs and imaging studies.  CBC: Recent Labs  Lab 10/24/19 2024 10/25/19 0433  WBC 9.9 9.2  NEUTROABS 8.2*  --   HGB 12.7* 11.6*  HCT 40.6 35.7*  MCV 100.7* 97.0  PLT 172 0000000   Basic Metabolic Panel: Recent Labs  Lab 10/24/19 2024 10/25/19 0433  NA 138 138  K 4.1 4.4  CL 99 105  CO2 11* 14*  GLUCOSE 145* 174*  BUN 11 8  CREATININE 1.22 1.24  CALCIUM 8.9 8.6*  MG  --  2.0   GFR: Estimated Creatinine Clearance: 71.7 mL/min (by C-G formula based on SCr of 1.24 mg/dL). Liver Function Tests: Recent Labs  Lab 10/24/19 2024  AST 43*  ALT 31  ALKPHOS 45  BILITOT 1.3*  PROT 7.8  ALBUMIN 4.3   No results for input(s): LIPASE, AMYLASE in the last 168 hours. No results for input(s): AMMONIA in the last 168 hours. Coagulation Profile: Recent Labs  Lab 10/24/19 2024  INR 1.1   Cardiac Enzymes: No results for input(s): CKTOTAL, CKMB, CKMBINDEX, TROPONINI in the last 168 hours. BNP (last 3 results) No results for input(s): PROBNP in the last 8760 hours. HbA1C: No results for input(s): HGBA1C in the last 72 hours. CBG: Recent Labs  Lab 10/25/19 0233 10/25/19 0608 10/25/19 1205  GLUCAP 155* 146* 129*   Lipid Profile: No results for input(s): CHOL, HDL, LDLCALC, TRIG, CHOLHDL, LDLDIRECT in the last 72 hours. Thyroid Function Tests: No results for input(s): TSH, T4TOTAL, FREET4, T3FREE, THYROIDAB in the last 72 hours. Anemia Panel: No results for input(s): VITAMINB12, FOLATE, FERRITIN, TIBC, IRON, RETICCTPCT in the last 72 hours. Sepsis Labs: Recent Labs  Lab 10/25/19 1230 10/25/19 1432  LATICACIDVEN 1.2 1.4    Recent Results (from the past 240 hour(s))  SARS CORONAVIRUS 2  (TAT 6-24 HRS) Nasopharyngeal Nasopharyngeal Swab     Status: None   Collection Time: 10/24/19 10:56 PM   Specimen: Nasopharyngeal Swab  Result Value Ref Range Status   SARS Coronavirus 2 NEGATIVE NEGATIVE Final    Comment: (NOTE) SARS-CoV-2 target nucleic acids are NOT DETECTED. The SARS-CoV-2 RNA is generally detectable in upper and lower respiratory specimens during the acute phase of infection. Negative results do not preclude SARS-CoV-2 infection, do not rule out co-infections with other pathogens, and should not be used as the sole basis for  treatment or other patient management decisions. Negative results must be combined with clinical observations, patient history, and epidemiological information. The expected result is Negative. Fact Sheet for Patients: SugarRoll.be Fact Sheet for Healthcare Providers: https://www.woods-mathews.com/ This test is not yet approved or cleared by the [place] FDA and  has been authorized for detection and/or diagnosis of SARS-CoV-2 by FDA under an Emergency Use Authorization (EUA). This EUA will remain  in effect (meaning this test can be used) for the duration of the COVID-19 declaration under Section 56 4(b)(1) of the Act, 21 U.S.C. section 360bbb-3(b)(1), unless the authorization is terminated or revoked sooner. Performed at [facility], [place] [address], [place], [place] [address]       Radiology Studies: No results found.   Scheduled Meds: . dofetilide  125 mcg Oral BID  . fluticasone furoate-vilanterol  1 puff Inhalation Daily   And  . umeclidinium bromide  1 puff Inhalation Daily  . folic acid  1 mg Oral Daily  . hydrocortisone  25 mg Rectal BID  . insulin aspart  0-15 Units Subcutaneous TID WC  . insulin glargine  40 Units Subcutaneous QHS  . LORazepam  0-4 mg Intravenous Q6H   Or  . LORazepam  0-4 mg Oral Q6H  . [START ON 10/27/2019] LORazepam  0-4 mg Intravenous Q12H   Or   . [START ON 10/27/2019] LORazepam  0-4 mg Oral Q12H  . losartan  100 mg Oral Daily  . metoprolol tartrate  100 mg Oral BID  . montelukast  10 mg Oral QHS  . multivitamin with minerals  1 tablet Oral Daily  . rosuvastatin  40 mg Oral Daily  . sodium chloride flush  3 mL Intravenous Q12H  . thiamine  100 mg Oral Daily   Continuous Infusions:   LOS: 0 days    Time spent: 25 minutes spent in the coordination of care today.    Jonnie Finner, DO [facility]  If 7PM-7AM, please contact night-coverage [email or web address] 10/25/2019, 3:52 PM

## 2019-10-25 NOTE — Progress Notes (Signed)
Pharmacy: Dofetilide (Tikosyn) - Initial Consult Assessment and Electrolyte Replacement  Pharmacy consulted to assist in monitoring and replacing electrolytes in this 69 y.o. male admitted on 10/24/2019 undergoing dofetilide re-initiation. First dofetilide dose: 1/21  Assessment:  Patient Exclusion Criteria: If any screening criteria checked as "Yes", then  patient  should NOT receive dofetilide until criteria item is corrected.  If "Yes" please indicate correction plan.  YES  NO Patient  Exclusion Criteria Correction Plan   [x]   []   Baseline QTc interval is greater than or equal to 440 msec. IF above YES box checked dofetilide contraindicated unless patient has ICD; then may proceed if QTc 500-550 msec or with known ventricular conduction abnormalities may proceed with QTc 550-600 msec. QTc = 440 Okay to continue per EP   []   [x]   Patient is known or suspected to have a digoxin level greater than 2 ng/ml: No results found for: DIGOXIN     []   [x]   Creatinine clearance less than 20 ml/min (calculated using Cockcroft-Gault, actual body weight and serum creatinine): Estimated Creatinine Clearance: 71.7 mL/min (by C-G formula based on SCr of 1.24 mg/dL).     []   [x]  Patient has received drugs known to prolong the QT intervals within the last 48 hours (phenothiazines, tricyclics or tetracyclic antidepressants, erythromycin, H-1 antihistamines, cisapride, fluoroquinolones, azithromycin). Updated information on QT prolonging agents is available to be searched on the following database:QT prolonging agents     [x]   []   Patient received a dose of hydrochlorothiazide (Oretic) alone or in any combination including triamterene (Dyazide, Maxzide) in the last 48 hours. On HCTZ PTA (last dose 1/21).  HCTZ has been discontinued. Continuing Tikosyn per EP   []   [x]  Patient received a medication known to increase dofetilide plasma concentrations prior to initial dofetilide dose:  . Trimethoprim  (Primsol, Proloprim) in the last 36 hours . Verapamil (Calan, Verelan) in the last 36 hours or a sustained release dose in the last 72 hours . Megestrol (Megace) in the last 5 days  . Cimetidine (Tagamet) in the last 6 hours . Ketoconazole (Nizoral) in the last 24 hours . Itraconazole (Sporanox) in the last 48 hours  . Prochlorperazine (Compazine) in the last 36 hours     []   [x]   Patient is known to have a history of torsades de pointes; congenital or acquired long QT syndromes.    []   [x]   Patient has received a Class 1 antiarrhythmic with less than 2 half-lives since last dose. (Disopyramide, Quinidine, Procainamide, Lidocaine, Mexiletine, Flecainide, Propafenone)    []   [x]   Patient has received amiodarone therapy in the past 3 months or amiodarone level is greater than 0.3 ng/ml.    Xarelto on hold  Labs:    Component Value Date/Time   K 4.4 10/25/2019 0433   MG 2.0 10/25/2019 0433     Plan: Potassium: K >/= 4: Appropriate to initiate Tikosyn, no replacement needed    Magnesium: Mg >2: Appropriate to initiate Tikosyn, no replacement needed     Thank you for allowing pharmacy to participate in this patient's care   Hildred Laser, PharmD Clinical Pharmacist **Pharmacist phone directory can now be found on [email or web address] ([email or web address]).  Listed under [place].

## 2019-10-25 NOTE — Progress Notes (Signed)
Patient arrived to unit. Increased respirations ~22, tremors in hands. Patient HR 120s-130s. Cardiac monitoring initiated and verified. Will continue to monitor throughout the shift.

## 2019-10-25 NOTE — Progress Notes (Signed)
Patient agitated and urinated all over the floor. RN and NT helped patient and will continue to monitor.

## 2019-10-25 NOTE — ED Notes (Signed)
Report given to rn on 3e 

## 2019-10-25 NOTE — Progress Notes (Signed)
   Vital Signs MEWS/VS Documentation      10/24/2019 2315 10/24/2019 2330 10/25/2019 0000 10/25/2019 0211   MEWS Score:  5  5  4  2    MEWS Score Color:  Red  Red  Red  Yellow   Resp:  (!) 27  (!) 26  (!) 24  20   Pulse:  --  (!) 133  (!) 134  (!) 126   BP:  (!) 165/94  (!) 157/86  140/74  (!) 158/85   Temp:  --  --  --  97.8 F (36.6 C)   O2 Device:  --  --  --  Room Air      VS being monitored frequently.     Tristan Schroeder 10/25/2019,3:09 AM

## 2019-10-26 LAB — GLUCOSE, CAPILLARY
Glucose-Capillary: 122 mg/dL — ABNORMAL HIGH (ref 70–99)
Glucose-Capillary: 139 mg/dL — ABNORMAL HIGH (ref 70–99)
Glucose-Capillary: 136 mg/dL — ABNORMAL HIGH (ref 70–99)

## 2019-10-26 LAB — BASIC METABOLIC PANEL
Anion gap: 15 (ref 5–15)
Anion gap: 11 (ref 5–15)
BUN: 8 mg/dL (ref 8–23)
BUN: 10 mg/dL (ref 8–23)
CO2: 20 mmol/L — ABNORMAL LOW (ref 22–32)
CO2: 24 mmol/L (ref 22–32)
Calcium: 8.6 mg/dL — ABNORMAL LOW (ref 8.9–10.3)
Calcium: 8.5 mg/dL — ABNORMAL LOW (ref 8.9–10.3)
Chloride: 102 mmol/L (ref 98–111)
Chloride: 101 mmol/L (ref 98–111)
Creatinine, Ser: 0.97 mg/dL (ref 0.61–1.24)
Creatinine, Ser: 1.03 mg/dL (ref 0.61–1.24)
GFR calc Af Amer: >60 mL/min (ref >60)
GFR calc Af Amer: >60 mL/min (ref >60)
GFR calc non Af Amer: >60 mL/min (ref >60)
GFR calc non Af Amer: >60 mL/min (ref >60)
Glucose, Bld: 122 mg/dL — ABNORMAL HIGH (ref 70–99)
Glucose, Bld: 149 mg/dL — ABNORMAL HIGH (ref 70–99)
Potassium: 3.4 mmol/L — ABNORMAL LOW (ref 3.5–5.1)
Potassium: 3.8 mmol/L (ref 3.5–5.1)
Sodium: 137 mmol/L (ref 135–145)
Sodium: 136 mmol/L (ref 135–145)

## 2019-10-26 LAB — CBC WITH DIFFERENTIAL/PLATELET
Abs Immature Granulocytes: 0.07 10*3/uL (ref 0.00–0.07)
Basophils Absolute: 0.1 10*3/uL (ref 0.0–0.1)
Basophils Relative: 1 %
Eosinophils Absolute: 0.1 10*3/uL (ref 0.0–0.5)
Eosinophils Relative: 1 %
HCT: 32 % — ABNORMAL LOW (ref 39.0–52.0)
Hemoglobin: 10.5 g/dL — ABNORMAL LOW (ref 13.0–17.0)
Immature Granulocytes: 1 %
Lymphocytes Relative: 15 %
Lymphs Abs: 1.1 10*3/uL (ref 0.7–4.0)
MCH: 31.9 pg (ref 26.0–34.0)
MCHC: 32.8 g/dL (ref 30.0–36.0)
MCV: 97.3 fL (ref 80.0–100.0)
Monocytes Absolute: 1.3 10*3/uL — ABNORMAL HIGH (ref 0.1–1.0)
Monocytes Relative: 18 %
Neutro Abs: 4.8 10*3/uL (ref 1.7–7.7)
Neutrophils Relative %: 64 %
Platelets: 151 10*3/uL (ref 150–400)
RBC: 3.29 MIL/uL — ABNORMAL LOW (ref 4.22–5.81)
RDW: 13.5 % (ref 11.5–15.5)
WBC: 7.4 10*3/uL (ref 4.0–10.5)
nRBC: 0 % (ref 0.0–0.2)

## 2019-10-26 LAB — MAGNESIUM: Magnesium: 2 mg/dL (ref 1.7–2.4)

## 2019-10-26 MED ORDER — POTASSIUM CHLORIDE CRYS ER 20 MEQ PO TBCR
40.0000 meq | EXTENDED_RELEASE_TABLET | Freq: Every day | ORAL | Status: DC
  Administered 2019-10-26 – 2019-10-27 (×2): 40 meq via ORAL
  Filled 2019-10-26 (×2): qty 2

## 2019-10-26 MED ORDER — MAGNESIUM SULFATE 2 GM/50ML IV SOLN
2.0000 g | Freq: Once | INTRAVENOUS | Status: AC
  Administered 2019-10-26: 2 g via INTRAVENOUS
  Filled 2019-10-26: qty 50

## 2019-10-26 MED ORDER — POTASSIUM CHLORIDE CRYS ER 20 MEQ PO TBCR
40.0000 meq | EXTENDED_RELEASE_TABLET | Freq: Once | ORAL | Status: AC
  Administered 2019-10-26: 40 meq via ORAL
  Filled 2019-10-26: qty 2

## 2019-10-26 MED ORDER — CHLORDIAZEPOXIDE HCL 25 MG PO CAPS
25.0000 mg | ORAL_CAPSULE | Freq: Three times a day (TID) | ORAL | Status: DC
  Administered 2019-10-26 – 2019-10-27 (×4): 25 mg via ORAL
  Filled 2019-10-26 (×4): qty 1

## 2019-10-26 MED ORDER — POTASSIUM CHLORIDE CRYS ER 20 MEQ PO TBCR: 40.0000 meq | EXTENDED_RELEASE_TABLET | Freq: Once | ORAL | Status: DC

## 2019-10-26 NOTE — TOC Progression Note (Signed)
Transition of Care [facility]) - Progression Note    Patient Details  Name: Adam [name] MRN: [id number] Date of Birth: 12-22-1950  [facility] CM/SW Contact  Zenon Mayo, RN Phone Number: 10/26/2019, 1:21 PM  Clinical Narrative:    NCM spoke with Joseph Art PA, she states patient is already on Tikosyn at home.          Expected Discharge Plan and Services                                                 Social Determinants of Health (SDOH) Interventions    Readmission Risk Interventions No flowsheet data found.

## 2019-10-26 NOTE — Progress Notes (Signed)
Pharmacy: Dofetilide (Tikosyn) - Follow Up Assessment and Electrolyte Replacement  Pharmacy consulted to assist in monitoring and replacing electrolytes in this 69 y.o. male admitted on 10/24/2019 undergoing dofetilide re-initiation. First dofetilide dose: 1/21  Labs:    Component Value Date/Time   K 3.8 10/26/2019 1818   MG 2.0 10/26/2019 0542     Plan: Potassium: K 3.8-3.9:  Give KCl 40 mEq po x1   Magnesium:  Not drawn - F/u AM labs    Benetta Spar, PharmD, BCPS, BCCP Clinical Pharmacist  Please check AMION for all [place] phone numbers After 10:00 PM, call [place] [phone]

## 2019-10-26 NOTE — Progress Notes (Signed)
Pharmacy: Dofetilide (Tikosyn) - Follow Up Assessment and Electrolyte Replacement  Pharmacy consulted to assist in monitoring and replacing electrolytes in this 69 y.o. male admitted on 10/24/2019 undergoing dofetilide re-initiation. First dofetilide dose: 1/21  Labs:    Component Value Date/Time   K 3.4 (L) 10/26/2019 0542   MG 2.0 10/26/2019 0542     Plan: Potassium: K < 3.5:  Give KCl 40 mEq q4hr x2   MD ordered 40 mEq PO daily, will add second dose for today as above  Magnesium: Mg 1.8-2: Give Mg 2 gm IV x1   BMP repeat this afternoon, daily BMP/Mg  Adam [name], PharmD Clinical Pharmacist Please check AMION for all [place] numbers 10/26/2019 9:46 AM

## 2019-10-26 NOTE — Progress Notes (Signed)
Marland Kitchen  PROGRESS NOTE    Adam [name]  [id number] DOB: Jun 24, 1951 DOA: 10/24/2019 PCP: [name], MD   Brief Narrative:   Ashby Dawes Capreolis a 69 y.o.malewith medical history significant forCAD s/p CABG, COPD, paroxysmal atrial fibrillation on Xarelto, ascending aortic aneurysm, insulin-dependent type 2 diabetes, hypertension, hyperlipidemia, anxiety/depression, OSA not currently on CPAP, and alcohol use who presents to the ED for evaluation of rectal bleeding  10/26/19: No further reports of bleeding ON. Per cardiology, will continue to hold xarelto. His slight drop in Hgb can be attributed to dilution. Spoke with GI given his family history, will have follow up on 11/08/19 @ 1030hrs. Will have follow up with a fib clinic in a week. If stable after his AM tikosyn dose tomorrow, should be able to d/c to home.     Assessment & Plan:   Principal Problem:   Alcohol withdrawal ([place]) Active Problems:   CAD (coronary artery disease)   Hypertension associated with diabetes ([place])   Hyperlipidemia associated with type 2 diabetes mellitus (HCC)   COPD (chronic obstructive pulmonary disease) (HCC)   Type 2 diabetes mellitus, controlled (HCC)   Paroxysmal atrial fibrillation (HCC)   Rectal bleeding  Alcohol withdrawal:     - Reports chronic daily alcohol use of 2-4 drinks of scotch per day with last drink around 48 hours prior to admission.       - Patient hypertensive, tachycardic, tachypneic, tremulous, and diaphoretic.  Concern is he is beginning to have withdrawal from alcohol therefore will be admitted for further management.     - continue Ativan per CIWA protocol     - continue multivitamin, folic acid, thiamine     - give banana bag today; vitals look ok, not tremulous, denies hallucinations/seizure-like activity     - 10/26/19: some agitation last night, but ok this AM; have started librium taper  Rectal bleeding:     - Reported dark red blood per rectum.       - FOBT is  positive.  Hemoglobin currently stable at 12.7.  Last colonoscopy in 2017, has history of polyps, questionable colitis, and mild sigmoid diverticulosis.     - Hold Xarelto     - Hgb is stable this AM; monitor     - anusol for hemorrhoids     - 10/26/19: slight drop in Hgb but no reports of bleed; will continue holding xarelto at discharge  Paroxysmal atrial fibrillation:     - Tachycardic on admission, likely contributed by suspected alcohol withdrawal.     - Holding Xarelto as above     - Resume home metoprolol and hold HCTZ     - EP has reviewed, ok to resume tikosyn at home dose     - 10/26/19: appreciate EP help; if stable, should be able to d/c to home after tikosyn dose tomorrow AM; continue holding xarelto at discharge  CAD s/p CABG:     - Chronic and stable, denies any chest pain.       - Resume metoprolol, statin.  Insulin-dependent type 2 diabetes:     - A1c 7.1% on 09/24/2019     - hold home Jardiance, Metformin, Januvia.       - continue reduced home Lantus plus moderate SSI while in hospital.     - glucose looks ok  COPD:     - currently stable without wheezing on admission.       - continue home Trelegy and Singulair.  Hypertension:     -  Continue home metoprolol, losartan     - d/c HCTZ and he should not be on this meds at discharge either  Hyperlipidemia:     - continue rosuvastatin.  Anxiety:     - takes Ativan 0.5 mg 3 times daily chronically for anxiety.     - Continue Ativan per CIWA protocol as above.     - librium taper started  DVT prophylaxis: SCDs Code Status: FULL Family Communication: None at bedside.   Disposition Plan: Needs stabilization on his tikosyn. Likely home in AM  Consultants:   EP  ROS:  Denies CP, N, ab pain, V, D . Remainder 10-pt ROS is negative for all not previously mentioned.  Subjective: "That is exactly what I want to do."  Objective: Vitals:   10/26/19 0038 10/26/19 0040 10/26/19 0509 10/26/19 0532  BP:   137/84 (!) 82/66 138/81  Pulse: 89 93 85 86  Resp:  20 20   Temp:  98.4 F (36.9 C) 97.7 F (36.5 C)   TempSrc:  Oral Oral   SpO2: 99% 97% 96%   Weight:   115 kg   Height:        Intake/Output Summary (Last 24 hours) at 10/26/2019 0759 Last data filed at 10/26/2019 [id number] Gross per 24 hour  Intake 1424 ml  Output 2150 ml  Net -726 ml   Filed Weights   10/25/19 0155 10/26/19 0509  Weight: 115.9 kg 115 kg    Examination:  General: 69 y.o. male resting in bed in NAD Cardiovascular: RRR, +S1, S2, no m/g/r Respiratory: CTABL, no w/r/r, normal WOB GI: BS+, NDNT, no masses noted, no organomegaly noted MSK: No e/c/c Neuro: A&O x 3, no focal deficits Psyc: calm/cooperative   Data Reviewed: I have personally reviewed following labs and imaging studies.  CBC: Recent Labs  Lab 10/24/19 2024 10/25/19 0433 10/26/19 0542  WBC 9.9 9.2 7.4  NEUTROABS 8.2*  --  4.8  HGB 12.7* 11.6* 10.5*  HCT 40.6 35.7* 32.0*  MCV 100.7* 97.0 97.3  PLT 172 153 [id number]   Basic Metabolic Panel: Recent Labs  Lab 10/24/19 2024 10/25/19 0433 10/26/19 0542  NA 138 138 137  K 4.1 4.4 3.4*  CL 99 105 102  CO2 11* 14* 20*  GLUCOSE 145* 174* 122*  BUN 11 8 8   CREATININE 1.22 1.24 0.97  CALCIUM 8.9 8.6* 8.6*  MG  --  2.0 2.0   GFR: Estimated Creatinine Clearance: 91.3 mL/min (by C-G formula based on SCr of 0.97 mg/dL). Liver Function Tests: Recent Labs  Lab 10/24/19 2024  AST 43*  ALT 31  ALKPHOS 45  BILITOT 1.3*  PROT 7.8  ALBUMIN 4.3   No results for input(s): LIPASE, AMYLASE in the last 168 hours. No results for input(s): AMMONIA in the last 168 hours. Coagulation Profile: Recent Labs  Lab 10/24/19 2024  INR 1.1   Cardiac Enzymes: No results for input(s): CKTOTAL, CKMB, CKMBINDEX, TROPONINI in the last 168 hours. BNP (last 3 results) No results for input(s): PROBNP in the last 8760 hours. HbA1C: No results for input(s): HGBA1C in the last 72 hours. CBG: Recent Labs  Lab  10/25/19 0608 10/25/19 1205 10/25/19 1624 10/25/19 2056 10/26/19 0618  GLUCAP 146* 129* 118* 162* 122*   Lipid Profile: No results for input(s): CHOL, HDL, LDLCALC, TRIG, CHOLHDL, LDLDIRECT in the last 72 hours. Thyroid Function Tests: No results for input(s): TSH, T4TOTAL, FREET4, T3FREE, THYROIDAB in the last 72 hours. Anemia Panel: No results for input(s): VITAMINB12,  FOLATE, FERRITIN, TIBC, IRON, RETICCTPCT in the last 72 hours. Sepsis Labs: Recent Labs  Lab 10/25/19 1230 10/25/19 1432  LATICACIDVEN 1.2 1.4    Recent Results (from the past 240 hour(s))  SARS CORONAVIRUS 2 (TAT 6-24 HRS) Nasopharyngeal Nasopharyngeal Swab     Status: None   Collection Time: 10/24/19 10:56 PM   Specimen: Nasopharyngeal Swab  Result Value Ref Range Status   SARS Coronavirus 2 NEGATIVE NEGATIVE Final    Comment: (NOTE) SARS-CoV-2 target nucleic acids are NOT DETECTED. The SARS-CoV-2 RNA is generally detectable in upper and lower respiratory specimens during the acute phase of infection. Negative results do not preclude SARS-CoV-2 infection, do not rule out co-infections with other pathogens, and should not be used as the sole basis for treatment or other patient management decisions. Negative results must be combined with clinical observations, patient history, and epidemiological information. The expected result is Negative. Fact Sheet for Patients: SugarRoll.be Fact Sheet for Healthcare Providers: https://www.woods-mathews.com/ This test is not yet approved or cleared by the [place] FDA and  has been authorized for detection and/or diagnosis of SARS-CoV-2 by FDA under an Emergency Use Authorization (EUA). This EUA will remain  in effect (meaning this test can be used) for the duration of the COVID-19 declaration under Section 56 4(b)(1) of the Act, 21 U.S.C. section 360bbb-3(b)(1), unless the authorization is terminated or revoked  sooner. Performed at [facility], [place] [address], [place], [place] [address]       Radiology Studies: No results found.   Scheduled Meds: . chlordiazePOXIDE  25 mg Oral TID  . dofetilide  125 mcg Oral BID  . fluticasone furoate-vilanterol  1 puff Inhalation Daily   And  . umeclidinium bromide  1 puff Inhalation Daily  . folic acid  1 mg Oral Daily  . hydrocortisone  25 mg Rectal BID  . insulin aspart  0-15 Units Subcutaneous TID WC  . insulin glargine  40 Units Subcutaneous QHS  . LORazepam  0-4 mg Intravenous Q6H   Or  . LORazepam  0-4 mg Oral Q6H  . [START ON 10/27/2019] LORazepam  0-4 mg Intravenous Q12H   Or  . [START ON 10/27/2019] LORazepam  0-4 mg Oral Q12H  . losartan  100 mg Oral Daily  . metoprolol tartrate  100 mg Oral BID  . montelukast  10 mg Oral QHS  . multivitamin with minerals  1 tablet Oral Daily  . potassium chloride  40 mEq Oral Daily  . rosuvastatin  40 mg Oral Daily  . sodium chloride flush  3 mL Intravenous Q12H  . thiamine  100 mg Oral Daily   Continuous Infusions:   LOS: 1 day    Time spent: 25 minutes spent in the coordination of care today.    Jonnie Finner, DO [facility]  If 7PM-7AM, please contact night-coverage [email or web address] 10/26/2019, 7:59 AM

## 2019-10-26 NOTE — Progress Notes (Addendum)
Progress Note  Patient Name: Adam [name] Date of Encounter: 10/26/2019  Primary Cardiologist: [name], MD >> Dr. [place]  Subjective   "I'm feeling OK, a little better"  No further bleeding reported  Inpatient Medications    Scheduled Meds: . chlordiazePOXIDE  25 mg Oral TID  . dofetilide  125 mcg Oral BID  . fluticasone furoate-vilanterol  1 puff Inhalation Daily   And  . umeclidinium bromide  1 puff Inhalation Daily  . folic acid  1 mg Oral Daily  . hydrocortisone  25 mg Rectal BID  . insulin aspart  0-15 Units Subcutaneous TID WC  . insulin glargine  40 Units Subcutaneous QHS  . LORazepam  0-4 mg Intravenous Q6H   Or  . LORazepam  0-4 mg Oral Q6H  . [START ON 10/27/2019] LORazepam  0-4 mg Intravenous Q12H   Or  . [START ON 10/27/2019] LORazepam  0-4 mg Oral Q12H  . losartan  100 mg Oral Daily  . metoprolol tartrate  100 mg Oral BID  . montelukast  10 mg Oral QHS  . multivitamin with minerals  1 tablet Oral Daily  . potassium chloride  40 mEq Oral Daily  . potassium chloride  40 mEq Oral Once  . rosuvastatin  40 mg Oral Daily  . sodium chloride flush  3 mL Intravenous Q12H  . thiamine  100 mg Oral Daily   Continuous Infusions: . magnesium sulfate bolus IVPB     PRN Meds: acetaminophen **OR** acetaminophen, gabapentin, ondansetron **OR** ondansetron (ZOFRAN) IV   Vital Signs    Vitals:   10/26/19 0509 10/26/19 0532 10/26/19 0811 10/26/19 0905  BP: (!) 82/66 138/81 130/84   Pulse: 85 86 97   Resp: 20  (!) 22   Temp: 97.7 F (36.5 C)  98.4 F (36.9 C)   TempSrc: Oral  Oral   SpO2: 96%  96% 97%  Weight: 115 kg     Height:        Intake/Output Summary (Last 24 hours) at 10/26/2019 1034 Last data filed at 10/26/2019 [id number] Gross per 24 hour  Intake 1304 ml  Output 2150 ml  Net -846 ml   Last 3 Weights 10/26/2019 10/25/2019 10/01/2019  Weight (lbs) 253 lb 9.6 oz 255 lb 9.6 oz 260 lb  Weight (kg) 115.032 kg 115.939 kg 117.935 kg       Telemetry    SR, generally 80's, occ PACs, PVCs- Personally Reviewed  ECG    Post dose EKG last night is SR 85pm, QTc 437ms - Personally Reviewed  Physical Exam   GEN: No acute distress.   Neck: No JVD Cardiac: RRR, no murmurs, rubs, or gallops.  Respiratory: CTA b/l GI: Soft, nontender, non-distended  MS: No edema; No deformity. Neuro:  Nonfocal  Psych: Normal affect   Labs    High Sensitivity Troponin:  No results for input(s): TROPONINIHS in the last 720 hours.    Chemistry Recent Labs  Lab 10/24/19 2024 10/25/19 0433 10/26/19 0542  NA 138 138 137  K 4.1 4.4 3.4*  CL 99 105 102  CO2 11* 14* 20*  GLUCOSE 145* 174* 122*  BUN 11 8 8   CREATININE 1.22 1.24 0.97  CALCIUM 8.9 8.6* 8.6*  PROT 7.8  --   --   ALBUMIN 4.3  --   --   AST 43*  --   --   ALT 31  --   --   ALKPHOS 45  --   --  BILITOT 1.3*  --   --   GFRNONAA >60 59* >60  GFRAA >60 >60 >60  ANIONGAP 28* 19* 15     Hematology Recent Labs  Lab 10/24/19 2024 10/25/19 0433 10/26/19 0542  WBC 9.9 9.2 7.4  RBC 4.03* 3.68* 3.29*  HGB 12.7* 11.6* 10.5*  HCT 40.6 35.7* 32.0*  MCV 100.7* 97.0 97.3  MCH 31.5 31.5 31.9  MCHC 31.3 32.5 32.8  RDW 13.5 13.8 13.5  PLT 172 153 151    BNPNo results for input(s): BNP, PROBNP in the last 168 hours.   DDimer No results for input(s): DDIMER in the last 168 hours.   Radiology    No results found.  Cardiac Studies   10/31/2017 EPS/Ablation CONCLUSIONS: 1. Atrial fibrillation upon presentation.  2. Intracardiac echo reveals a moderate sized left atrium with four separate pulmonary veins without evidence of pulmonary vein stenosis. 3. Successful electrical isolation and anatomical encircling of all four pulmonary veins with radiofrequency current. A WACA approach was used 3. Additional left atrial ablation was performed with a standard box lesion created along the posterior wall of the left atrium 4. Atrial fibrillation successfully cardioverted to  sinus rhythm. 5. No early apparent complications.   10/30/2017: TEE Study Conclusions - Left ventricle: The cavity size was normal. Wall thickness was normal. Systolic function was normal. The estimated ejection fraction was in the range of 55% to 60%. Wall motion was normal; there were no regional wall motion abnormalities. - Mitral valve: Mildly calcified annulus. - Left atrium: No evidence of thrombus in the atrial cavity or appendage. No spontaneous echo contrast was observed. The appendage was morphologically a left appendage, multilobulated, and of normal size. Emptying velocity was mildly reduced. - Right atrium: No evidence of thrombus in the atrial cavity or appendage. - Atrial septum: There was a very small patent foramen ovale. Doppler showed a trivial left-to-right shunt, in the baseline state.  Patient Profile     69 y.o. male with a hx of HTN, HLD, CAD (CABG 2011 with rports of numerous prior stents), COPD, OSA (intolerant of CPAP), DM, obesity, and persistent AFib), ascending Ao aneurysm, + ETOH (3-4 drinks daily), admitted with 2 days of generalized weakness, nausea and development of rectal bleeding as well as suspect ETOH withdrawal  EP asked to the case for re-initiation guidance for his Tikosyn    AFib Hx PVI ablation 03/31/2018, Dr.Jenny Lai Initial diagnosis appears post-op 2011 after his CABG though  Had long hiatus without any until 2019) AAD Tikosyn started 12/13/2017, is current Pt has stated he was on amiodarone back in 2011 briefly, turned his skin purple  MDT ILR implanted 07/02/19 for AFib surveillence  Assessment & Plan    1. Persistent AFib     CHA2DS2Vasc is 3, on Xarelto outpatient (held given bleeding presentation)     He remains in SR     K+ 3.4 (already being replaced)     Mag 2.0     Creat stable     QT stable after 2nd dose last night   Keep K+ 4.0 or better, please assess at discharge if ongoing replacement is  required, appreciate pharmacy help and management Keep Mag 2.0 or better  Given this is a resumption of his home Tikosyn,  would be OK to discharge from an EP perspective after his 5th dose of Tikosyn tomorrow if QTc and telemetry stable  (and ready otherwise medically.)       Continue to hold xarelto at discharge.  EP  will follow his AF burden by loop recorder remotely and consider down the road if his afib burden warrants restarting his anticoagulation.   [place] home HCTZ upon discharge   1 week AFib clinic follow up is in place    For questions or updates, please contact [place] Please consult [email or web address] for contact info under        Signed, Baldwin [place], PA-C  10/26/2019, 10:34 AM     I have seen, examined the patient, and reviewed the above assessment and plan.  Changes to above are made where necessary.  On exam, RRR.  OK to discharge to home tomorrow if qt is stable after am tikosyn done.  Hold xarelto.  He has follow-up in the AF clinic arranged for 1/29.  Electrophysiology team to see as needed while here. Please call with questions.   Co Sign: [name], MD 10/26/2019

## 2019-10-26 NOTE — Plan of Care (Signed)
  Problem: Education: Goal: Knowledge of General Education information will improve Description: Including pain rating scale, medication(s)/side effects and non-pharmacologic comfort measures Outcome: Progressing   Problem: Activity: Goal: Risk for activity intolerance will decrease Outcome: Progressing   

## 2019-10-27 DIAGNOSIS — E1165 Type 2 diabetes mellitus with hyperglycemia: Secondary | ICD-10-CM

## 2019-10-27 DIAGNOSIS — E876 Hypokalemia: Secondary | ICD-10-CM

## 2019-10-27 DIAGNOSIS — E1169 Type 2 diabetes mellitus with other specified complication: Secondary | ICD-10-CM

## 2019-10-27 DIAGNOSIS — E785 Hyperlipidemia, unspecified: Secondary | ICD-10-CM

## 2019-10-27 LAB — BASIC METABOLIC PANEL
Anion gap: 11 (ref 5–15)
BUN: 10 mg/dL (ref 8–23)
CO2: 22 mmol/L (ref 22–32)
Calcium: 8.8 mg/dL — ABNORMAL LOW (ref 8.9–10.3)
Chloride: 104 mmol/L (ref 98–111)
Creatinine, Ser: 0.84 mg/dL (ref 0.61–1.24)
GFR calc Af Amer: >60 mL/min (ref >60)
GFR calc non Af Amer: >60 mL/min (ref >60)
Glucose, Bld: 128 mg/dL — ABNORMAL HIGH (ref 70–99)
Potassium: 3.8 mmol/L (ref 3.5–5.1)
Sodium: 137 mmol/L (ref 135–145)

## 2019-10-27 LAB — CBC WITH DIFFERENTIAL/PLATELET
Abs Immature Granulocytes: 0.08 10*3/uL — ABNORMAL HIGH (ref 0.00–0.07)
Basophils Absolute: 0 10*3/uL (ref 0.0–0.1)
Basophils Relative: 1 %
Eosinophils Absolute: 0.1 10*3/uL (ref 0.0–0.5)
Eosinophils Relative: 2 %
HCT: 31.9 % — ABNORMAL LOW (ref 39.0–52.0)
Hemoglobin: 10.6 g/dL — ABNORMAL LOW (ref 13.0–17.0)
Immature Granulocytes: 1 %
Lymphocytes Relative: 17 %
Lymphs Abs: 1 10*3/uL (ref 0.7–4.0)
MCH: 31.8 pg (ref 26.0–34.0)
MCHC: 33.2 g/dL (ref 30.0–36.0)
MCV: 95.8 fL (ref 80.0–100.0)
Monocytes Absolute: 1 10*3/uL (ref 0.1–1.0)
Monocytes Relative: 15 %
Neutro Abs: 4 10*3/uL (ref 1.7–7.7)
Neutrophils Relative %: 64 %
Platelets: 151 10*3/uL (ref 150–400)
RBC: 3.33 MIL/uL — ABNORMAL LOW (ref 4.22–5.81)
RDW: 13.3 % (ref 11.5–15.5)
WBC: 6.2 10*3/uL (ref 4.0–10.5)
nRBC: 0 % (ref 0.0–0.2)

## 2019-10-27 LAB — MAGNESIUM: Magnesium: 2 mg/dL (ref 1.7–2.4)

## 2019-10-27 LAB — GLUCOSE, CAPILLARY
Glucose-Capillary: 134 mg/dL — ABNORMAL HIGH (ref 70–99)
Glucose-Capillary: 164 mg/dL — ABNORMAL HIGH (ref 70–99)

## 2019-10-27 MED ORDER — CHLORDIAZEPOXIDE HCL 25 MG PO CAPS: 25.0000 mg | ORAL_CAPSULE | Freq: Two times a day (BID) | ORAL | Status: DC

## 2019-10-27 MED ORDER — POTASSIUM CHLORIDE CRYS ER 20 MEQ PO TBCR: 40.0000 meq | EXTENDED_RELEASE_TABLET | Freq: Two times a day (BID) | ORAL | 0 refills | Status: DC

## 2019-10-27 MED ORDER — LOSARTAN POTASSIUM 100 MG PO TABS: 100.0000 mg | ORAL_TABLET | Freq: Every day | ORAL | 0 refills | Status: DC

## 2019-10-27 MED ORDER — POTASSIUM CHLORIDE CRYS ER 20 MEQ PO TBCR: EXTENDED_RELEASE_TABLET | ORAL | 8 refills | Status: DC

## 2019-10-27 MED ORDER — MAGNESIUM SULFATE 2 GM/50ML IV SOLN
2.0000 g | Freq: Once | INTRAVENOUS | Status: AC
  Administered 2019-10-27: 2 g via INTRAVENOUS
  Filled 2019-10-27: qty 50

## 2019-10-27 MED ORDER — SODIUM CHLORIDE 0.9 % IV SOLN
INTRAVENOUS | Status: DC | PRN
  Administered 2019-10-27: 250 mL via INTRAVENOUS

## 2019-10-27 NOTE — Progress Notes (Signed)
Discharge education and medication education given to patient and spouse with teach back. All questions and concerns answered. Peripheral IV and telemetry leads removed. All patient belongings given to patient. Patient transported to main entrance by nurse tech via wheelchair.

## 2019-10-27 NOTE — Progress Notes (Addendum)
0300 - Fourteen and a half tablets of Lorazepam were found in patient's room in a small, personal plastic container. Nurse tech witnessed patient take one tablet. The box was confiscated and brought to pharmacy. Pt did comply. MD notified. Will continue to monitor patient status.

## 2019-10-27 NOTE — Discharge Summary (Signed)
. Physician Discharge Summary  Adam [name]  [id number] DOB: 01/14/1951 DOA: 10/24/2019  PCP: [name], MD  Admit date: 10/24/2019 Discharge date: 10/27/2019  Admitted From: Home Disposition:  Discharged to home.   Recommendations for Outpatient Follow-up:  1. Follow up with PCP in 1-2 weeks 2. Please obtain BMP/CBC in one week 3. Follow up with a fib clinic as scheduled 4. Follow up with GI clinic as scheduled.  Discharge Condition: Stable  CODE STATUS: FULL   Brief/Interim Summary: Adam [name] a 69 y.o.malewith medical history significant forCAD s/p CABG, COPD, paroxysmal atrial fibrillation on Xarelto, ascending aortic aneurysm, insulin-dependent type 2 diabetes, hypertension, hyperlipidemia, anxiety/depression, OSA not currently on CPAP, and alcohol use who presents to the ED for evaluation of rectal bleeding  10/27/19: Hgb stable. No new bleeds reported. He will remain off xarelto for now per cardiology. He will need to follow up with a fib clinic and GI clinic as scheduled. He will need to follow up with PCP in next 1 - 2 weeks. He is to permanently discontinue HCTZ. A separate Rx for losartan has been written. He is to continue his tikosyn. He is stable for discharge home.   Discharge Diagnoses:  Principal Problem:   Alcohol withdrawal ([place]) Active Problems:   CAD (coronary artery disease)   Hypertension associated with diabetes ([place])   Hyperlipidemia associated with type 2 diabetes mellitus (H[facility])   COPD (chronic obstructive pulmonary disease) (HCC)   Type 2 diabetes mellitus, controlled (HCC)   Paroxysmal atrial fibrillation (HCC)   Rectal bleeding  Alcohol withdrawal: - Reports chronic daily alcohol use of 2-4 drinks of scotch per day with last drink around 48 hours prior to admission.  - Patient hypertensive, tachycardic, tachypneic, tremulous, and diaphoretic. Concern is he is beginning to have withdrawal from alcohol therefore will  be admitted for further management. - continue Ativan per CIWA protocol - continue multivitamin, folic acid, thiamine - give banana bag today; vitals look ok, not tremulous, denies hallucinations/seizure-like activity     - 10/26/19: some agitation last night, but ok this AM; have started librium taper     - 10/27/19: ok this AM, stable for discharge.   Rectal bleeding: - Reported dark red blood per rectum.  - FOBT is positive. Hemoglobin currently stable at 12.7. Last colonoscopy in 2017, has history of polyps, questionable colitis, and mild sigmoid diverticulosis. - Hold Xarelto - Hgb is stable this AM; monitor - anusol for hemorrhoids     - 10/26/19: slight drop in Hgb but no reports of bleed; will continue holding xarelto at discharge     - 10/27/19: stable Hgb, no new bleeds; will remain off xarelto per cards at discharge  Paroxysmal atrial fibrillation: - Tachycardic on admission, likely contributed by suspected alcohol withdrawal. - Holding Xarelto as above - Resume home metoprolol and hold HCTZ - EP has reviewed, ok to resume tikosyn at home dose     - 10/26/19: appreciate EP help; if stable, should be able to d/c to home after tikosyn dose tomorrow AM; continue holding xarelto at discharge     - 10/27/19: stable for d/c to home; continue tikosyn and K+ 40mg  BID; follow up with a fib clinic as scheduled.  CAD s/p CABG: - Chronic and stable, denies any chest pain.  - continue metoprolol, statin.  Insulin-dependent type 2 diabetes: - A1c 7.1% on 09/24/2019 - hold home Jardiance, Metformin, Januvia.  - continue reduced home Lantus plus moderate SSI while in hospital. - glucose  looks ok  COPD: - currently stable without wheezing on admission.  - continue home Trelegy and Singulair.  Hypertension: - Continue home metoprolol, losartan - d/c HCTZ and he should not be on this meds at  discharge either     - HCTZ permanently d/c'd; a separate Rx for losartan has been written   Hyperlipidemia: - continue rosuvastatin.  Anxiety: - takes Ativan 0.5 mg 3 times daily chronically for anxiety. - Continue Ativan per CIWA protocol as above.     - He is stable ON. [place] for discharge  Discharge Instructions   Allergies as of 10/27/2019      Reactions   Amoxicillin Rash, Other (See Comments)   * SEVERE RASH IN GROIN AREA Has patient had a PCN reaction causing immediate rash, facial/tongue/throat swelling, SOB or lightheadedness with hypotension: no Has patient had a PCN reaction causing severe rash involving mucus membranes or skin necrosis: no Has patient had a PCN reaction that required hospitalization: no Has patient had a PCN reaction occurring within the last 10 years: yes If all of the above answers are "NO", then may proceed with Cephalosporin use.   Augmentin [amoxicillin-pot Clavulanate] Rash, Other (See Comments)   * SEVERE RASH IN GROIN AREA   Azithromycin Rash, Other (See Comments)   * SEVERE RASH IN GROIN AREA   Clindamycin/lincomycin Rash   Keflex [cephalexin] Rash      Medication List    STOP taking these medications   losartan-hydrochlorothiazide 100-25 MG tablet Commonly known as: HYZAAR   Xarelto 20 MG Tabs tablet Generic drug: rivaroxaban     TAKE these medications   allopurinol 300 MG tablet Commonly known as: ZYLOPRIM Take 1 tablet (300 mg total) by mouth daily.   Co Q-10 100 MG Caps Take 100 mg by mouth daily.   dofetilide 125 MCG capsule Commonly known as: TIKOSYN Take 1 capsule (125 mcg total) by mouth 2 (two) times daily. May have 90 day refills if patient prefers   fenofibrate micronized 200 MG capsule Commonly known as: LOFIBRA Take 1 capsule (200 mg total) by mouth daily before breakfast.   fluticasone 50 MCG/ACT nasal spray Commonly known as: FLONASE Place 1 spray into both nostrils daily.   gabapentin 300 MG  capsule Commonly known as: Neurontin Take two to three tablets at night as needed for restless leg symptoms What changed:   how much to take  how to take this  when to take this  reasons to take this  additional instructions   glucose blood test strip Commonly known as: OneTouch Verio Test once daily.  Dx E11.9   Jardiance 25 MG Tabs tablet Generic drug: empagliflozin TAKE 1 TABLET BY MOUTH DAILY What changed: how much to take   LORazepam 0.5 MG tablet Commonly known as: ATIVAN Take 1 tablet (0.5 mg total) by mouth every 8 (eight) hours as needed for anxiety.   losartan 100 MG tablet Commonly known as: COZAAR Take 1 tablet (100 mg total) by mouth daily. Start taking on: October 28, 2019 What changed: See the new instructions.   metFORMIN 1000 MG tablet Commonly known as: GLUCOPHAGE TAKE 1 TABLET BY MOUTH TWICE DAILY WITH A MEAL What changed: See the new instructions.   metoprolol tartrate 100 MG tablet Commonly known as: LOPRESSOR TAKE 1 TABLET(100 MG) BY MOUTH TWICE DAILY What changed: See the new instructions.   montelukast 10 MG tablet Commonly known as: SINGULAIR TAKE 1 TABLET BY MOUTH AT BEDTIME   potassium chloride SA 20 MEQ  tablet Commonly known as: KLOR-CON Take 2 tablets (40 mEq total) by mouth 2 (two) times daily. What changed:   how much to take  how to take this  when to take this  additional instructions   RABEprazole 20 MG tablet Commonly known as: Aciphex Take 1 tablet (20 mg total) by mouth daily.   rosuvastatin 40 MG tablet Commonly known as: CRESTOR Take 1 tablet (40 mg total) by mouth daily.   sitaGLIPtin 100 MG tablet Commonly known as: Januvia Take 1 tablet (100 mg total) by mouth daily.   Toujeo SoloStar 300 UNIT/ML Sopn Generic drug: Insulin Glargine (1 Unit Dial) INJECT 60 UNITS AS DIRECTED DAILY What changed: See the new instructions.   Trelegy Ellipta 100-62.5-25 MCG/INH Aepb Generic drug:  Fluticasone-Umeclidin-Vilant Inhale 1 puff into the lungs daily.   Vascepa 1 g capsule Generic drug: icosapent Ethyl Take 2 capsules by mouth 2 (two) times daily.   ZzzQuil 25 MG Caps Generic drug: diphenhydrAMINE HCl (Sleep) Take 25 mg by mouth at bedtime as needed (for sleep).      Follow-up Information    [facility] Follow up.   Specialty: Cardiology Why: 11/02/2019 @ 9:30AM with Roderic Palau, NP Contact information: [address] [id number] [place] [phone]       [name], MD Follow up in 1 week(s).   Specialty: Family Medicine Contact information: [place] [address] [phone]        [name], MD .   Specialty: Cardiology Contact information: [address] [place]  [address] [phone]          Allergies  Allergen Reactions  . Amoxicillin Rash and Other (See Comments)    * SEVERE RASH IN GROIN AREA Has patient had a PCN reaction causing immediate rash, facial/tongue/throat swelling, SOB or lightheadedness with hypotension: no Has patient had a PCN reaction causing severe rash involving mucus membranes or skin necrosis: no Has patient had a PCN reaction that required hospitalization: no Has patient had a PCN reaction occurring within the last 10 years: yes If all of the above answers are "NO", then may proceed with Cephalosporin use.   . Augmentin [Amoxicillin-Pot Clavulanate] Rash and Other (See Comments)    * SEVERE RASH IN GROIN AREA  . Azithromycin Rash and Other (See Comments)    * SEVERE RASH IN GROIN AREA  . Clindamycin/Lincomycin Rash  . Keflex [Cephalexin] Rash    Consultations:  EP   Procedures/Studies: CUP PACEART REMOTE DEVICE CHECK  Result Date: 10/08/2019 [facility] summary report received. Battery status OK. Normal device function. No new symptom episodes, tachy episodes, brady, or pause episodes. 35 AF  events, longest 8 mins. EGM's appear SR w/ ectopy. Monthly summary reports and ROV/PRN     Subjective: "So I'm going today?"  Discharge Exam: Vitals:   10/27/19 0726 10/27/19 0924  BP:  116/65  Pulse:  (!) 109  Resp:    Temp:    SpO2: 97%    Vitals:   10/27/19 0028 10/27/19 0625 10/27/19 0726 10/27/19 0924  BP: (!) 148/90 (!) 142/87  116/65  Pulse: 90 92  (!) 109  Resp: 20 20    Temp: 98.1 F (36.7 C) 98.7 F (37.1 C)    TempSrc:      SpO2: 99% 96% 97%   Weight:  116.4 kg    Height:        General: 69 y.o. male resting in bed  in NAD Cardiovascular: RRR, +S1, S2, no m/g/r, equal pulses throughout Respiratory: CTABL, no w/r/r, normal WOB GI: BS+, NDNT, no masses noted, soft MSK: No e/c/c Neuro: A&O x 3, no focal deficits Psyc: Appropriate interaction and affect, calm/cooperative     The results of significant diagnostics from this hospitalization (including imaging, microbiology, ancillary and laboratory) are listed below for reference.     Microbiology: Recent Results (from the past 240 hour(s))  SARS CORONAVIRUS 2 (TAT 6-24 HRS) Nasopharyngeal Nasopharyngeal Swab     Status: None   Collection Time: 10/24/19 10:56 PM   Specimen: Nasopharyngeal Swab  Result Value Ref Range Status   SARS Coronavirus 2 NEGATIVE NEGATIVE Final    Comment: (NOTE) SARS-CoV-2 target nucleic acids are NOT DETECTED. The SARS-CoV-2 RNA is generally detectable in upper and lower respiratory specimens during the acute phase of infection. Negative results do not preclude SARS-CoV-2 infection, do not rule out co-infections with other pathogens, and should not be used as the sole basis for treatment or other patient management decisions. Negative results must be combined with clinical observations, patient history, and epidemiological information. The expected result is Negative. Fact Sheet for Patients: SugarRoll.be Fact Sheet for Healthcare  Providers: https://www.woods-mathews.com/ This test is not yet approved or cleared by the [place] FDA and  has been authorized for detection and/or diagnosis of SARS-CoV-2 by FDA under an Emergency Use Authorization (EUA). This EUA will remain  in effect (meaning this test can be used) for the duration of the COVID-19 declaration under Section 56 4(b)(1) of the Act, 21 U.S.C. section 360bbb-3(b)(1), unless the authorization is terminated or revoked sooner. Performed at [facility], [place] [address], [place], [place] [address]      Labs: BNP (last 3 results) No results for input(s): BNP in the last 8760 hours. Basic Metabolic Panel: Recent Labs  Lab 10/24/19 2024 10/25/19 0433 10/26/19 0542 10/26/19 1818 10/27/19 0626  NA 138 138 137 136 137  K 4.1 4.4 3.4* 3.8 3.8  CL 99 105 102 101 104  CO2 11* 14* 20* 24 22  GLUCOSE 145* 174* 122* 149* 128*  BUN 11 8 8 10 10   CREATININE 1.22 1.24 0.97 1.03 0.84  CALCIUM 8.9 8.6* 8.6* 8.5* 8.8*  MG  --  2.0 2.0  --  2.0   Liver Function Tests: Recent Labs  Lab 10/24/19 2024  AST 43*  ALT 31  ALKPHOS 45  BILITOT 1.3*  PROT 7.8  ALBUMIN 4.3   No results for input(s): LIPASE, AMYLASE in the last 168 hours. No results for input(s): AMMONIA in the last 168 hours. CBC: Recent Labs  Lab 10/24/19 2024 10/25/19 0433 10/26/19 0542 10/27/19 0626  WBC 9.9 9.2 7.4 6.2  NEUTROABS 8.2*  --  4.8 4.0  HGB 12.7* 11.6* 10.5* 10.6*  HCT 40.6 35.7* 32.0* 31.9*  MCV 100.7* 97.0 97.3 95.8  PLT 172 153 151 151   Cardiac Enzymes: No results for input(s): CKTOTAL, CKMB, CKMBINDEX, TROPONINI in the last 168 hours. BNP: Invalid input(s): POCBNP CBG: Recent Labs  Lab 10/25/19 2056 10/26/19 0618 10/26/19 1243 10/26/19 2115 10/27/19 0622  GLUCAP 162* 122* 139* 136* 134*   D-Dimer No results for input(s): DDIMER in the last 72 hours. Hgb A1c No results for input(s): HGBA1C in the last 72 hours. Lipid Profile No  results for input(s): CHOL, HDL, LDLCALC, TRIG, CHOLHDL, LDLDIRECT in the last 72 hours. Thyroid function studies No results for input(s): TSH, T4TOTAL, T3FREE, THYROIDAB in the last 72  hours.  Invalid input(s): FREET3 Anemia work up No results for input(s): VITAMINB12, FOLATE, FERRITIN, TIBC, IRON, RETICCTPCT in the last 72 hours. Urinalysis    Component Value Date/Time   COLORURINE YELLOW 10/25/2019 0239   APPEARANCEUR CLEAR 10/25/2019 0239   LABSPEC 1.023 10/25/2019 0239   PHURINE 5.0 10/25/2019 0239   GLUCOSEU >=500 (A) 10/25/2019 0239   HGBUR SMALL (A) 10/25/2019 0239   BILIRUBINUR NEGATIVE 10/25/2019 0239   BILIRUBINUR 1+ 08/14/2013 1200   KETONESUR 80 (A) 10/25/2019 0239   PROTEINUR 30 (A) 10/25/2019 0239   UROBILINOGEN 0.2 08/14/2013 1200   NITRITE NEGATIVE 10/25/2019 0239   LEUKOCYTESUR NEGATIVE 10/25/2019 0239   Sepsis Labs Invalid input(s): PROCALCITONIN,  WBC,  LACTICIDVEN Microbiology Recent Results (from the past 240 hour(s))  SARS CORONAVIRUS 2 (TAT 6-24 HRS) Nasopharyngeal Nasopharyngeal Swab     Status: None   Collection Time: 10/24/19 10:56 PM   Specimen: Nasopharyngeal Swab  Result Value Ref Range Status   SARS Coronavirus 2 NEGATIVE NEGATIVE Final    Comment: (NOTE) SARS-CoV-2 target nucleic acids are NOT DETECTED. The SARS-CoV-2 RNA is generally detectable in upper and lower respiratory specimens during the acute phase of infection. Negative results do not preclude SARS-CoV-2 infection, do not rule out co-infections with other pathogens, and should not be used as the sole basis for treatment or other patient management decisions. Negative results must be combined with clinical observations, patient history, and epidemiological information. The expected result is Negative. Fact Sheet for Patients: SugarRoll.be Fact Sheet for Healthcare Providers: https://www.woods-mathews.com/ This test is not yet approved or  cleared by the [place] FDA and  has been authorized for detection and/or diagnosis of SARS-CoV-2 by FDA under an Emergency Use Authorization (EUA). This EUA will remain  in effect (meaning this test can be used) for the duration of the COVID-19 declaration under Section 56 4(b)(1) of the Act, 21 U.S.C. section 360bbb-3(b)(1), unless the authorization is terminated or revoked sooner. Performed at [facility], [place] [address] [place], [place] [address]      Time coordinating discharge: 35 minutes  SIGNED:   Jonnie Finner, DO  [facility] 10/27/2019, 12:11 PM   If 7PM-7AM, please contact night-coverage [email or web address]

## 2019-10-27 NOTE — Progress Notes (Signed)
Pharmacy: Dofetilide (Tikosyn) - Follow Up Assessment and Electrolyte Replacement  Pharmacy consulted to assist in monitoring and replacing electrolytes in this 69 y.o. male admitted on 10/24/2019 undergoing dofetilide re-initiation. First dofetilide dose: 1/21 in the morning   Labs:    Component Value Date/Time   K 3.8 10/27/2019 0626   MG 2.0 10/27/2019 [id number]     Plan: Potassium: K 3.8-3.9:  Give KCl 40 mEq po x1 (already ordered as daily per Dr. Marylyn Ishihara, okay for now)  Magnesium: Mg 1.8-2: Give Mg 2 gm IV x1    As patient has required on average 80 mEq of potassium replacement since starting dofetilide on 1/21, recommend discharging patient with prescription for:  Potassium chloride 40 mEq  twice daily - if patient is not discharged today, this recommendation may change pending following labs.   Thank you for allowing pharmacy to participate in this patient's care   Eddie Candle, PharmD PGY-1 Pharmacy Resident   Please check amion for clinical pharmacist contact number 10/27/2019  8:02 AM

## 2019-10-29 ENCOUNTER — Telehealth: Payer: Self-pay

## 2019-10-29 ENCOUNTER — Telehealth: Payer: Self-pay | Admitting: Family Medicine

## 2019-10-29 NOTE — Telephone Encounter (Signed)
I see that a Cherylann Ratel, DO sent in a 30 day refill of the Losartan yesterday.  OK to send in a 90 day Rx?  Please advise.

## 2019-10-29 NOTE — Telephone Encounter (Signed)
yes

## 2019-10-29 NOTE — Telephone Encounter (Signed)
Transition Care Management Follow-up Telephone Call  Date of discharge and from where: 10/27/19 from [facility]  How have you been since you were released from the hospital? "I have been miserable, I'm still going to the bathroom a lot"  Any questions or concerns? Yes ; discussed blood that he is seeing from rectum is dark in color now.  Items Reviewed:  Did the pt receive and understand the discharge instructions provided? Yes   Medications obtained and verified? No ; patient had not yet picked up new prescriptions from pharmacy. Instructed him to have someone pick these up for him.   Any new allergies since your discharge? No   Dietary orders reviewed? Yes  Do you have support at home? Yes ; wife  Other (ie: DME, Home Health, etc) no  Functional Questionnaire: (I = Independent and D = Dependent) ADL's: He requires some assistance from his wife due to weakenss.  Bathing/Dressing-  Requires help from wife   Meal Prep- requires help from wife  Eating- independent  Maintaining continence- independent  Transferring/Ambulation- requires help from wife  Managing Meds- self and instructed him to have someone pick up new prescriptions from pharmacy because nobody has done that yet.   Follow up appointments reviewed:    PCP Hospital f/u appt confirmed? Yes  Scheduled to see Dr. Elease Hashimoto on 11/05/19  Specialist Hospital f/u appt confirmed? No  He stated he has access to mychart and keeps up with appointments that way  Are transportation arrangements needed? No   If their condition worsens, is the pt aware to call  their PCP or go to the ED? Yes  Was the patient provided with contact information for the PCP's office or ED? Yes  Was the pt encouraged to call back with questions or concerns? Yes

## 2019-10-29 NOTE — Telephone Encounter (Signed)
Pt needs his medication refilled (Losartan) at [facility] on [address], [place], [place] [address] . He is having issues with the pharmacy. Pt states that he has one of his medication but needs the other prescription. Pt would like to be contacted at [phone]

## 2019-10-30 ENCOUNTER — Other Ambulatory Visit: Payer: Self-pay

## 2019-10-30 MED ORDER — LOSARTAN POTASSIUM 100 MG PO TABS: 100.0000 mg | ORAL_TABLET | Freq: Every day | ORAL | 0 refills | Status: DC

## 2019-10-30 NOTE — Telephone Encounter (Signed)
Called patient and LMOVM to return call  [place] for [facility] to Discuss results / PCP / recommendations / Schedule patient  I left a detailed voice message letting the patient know that I have sent in a 90 day refill to the requested pharmacy for him.

## 2019-10-31 ENCOUNTER — Encounter: Payer: Self-pay | Admitting: Family Medicine

## 2019-11-02 ENCOUNTER — Inpatient Hospital Stay (HOSPITAL_COMMUNITY): Admit: 2019-11-02 | Payer: Medicare Other | Admitting: Nurse Practitioner

## 2019-11-02 ENCOUNTER — Encounter (HOSPITAL_COMMUNITY): Payer: Self-pay | Admitting: Physician Assistant

## 2019-11-02 ENCOUNTER — Ambulatory Visit (HOSPITAL_COMMUNITY)
Admission: RE | Admit: 2019-11-02 | Discharge: 2019-11-02 | Disposition: A | Discharge status: Home / Self Care | Payer: Medicare Other | Source: Ambulatory Visit | Attending: Nurse Practitioner | Admitting: Nurse Practitioner

## 2019-11-02 ENCOUNTER — Other Ambulatory Visit: Payer: Self-pay

## 2019-11-02 VITALS — BP 110/64 | HR 87 | Ht 70.0 in | Wt 253.2 lb

## 2019-11-02 DIAGNOSIS — I10 Essential (primary) hypertension: Secondary | ICD-10-CM | POA: Diagnosis not present

## 2019-11-02 DIAGNOSIS — E669 Obesity, unspecified: Secondary | ICD-10-CM | POA: Insufficient documentation

## 2019-11-02 DIAGNOSIS — E785 Hyperlipidemia, unspecified: Secondary | ICD-10-CM | POA: Insufficient documentation

## 2019-11-02 DIAGNOSIS — E119 Type 2 diabetes mellitus without complications: Secondary | ICD-10-CM | POA: Diagnosis not present

## 2019-11-02 DIAGNOSIS — M109 Gout, unspecified: Secondary | ICD-10-CM | POA: Insufficient documentation

## 2019-11-02 DIAGNOSIS — Z880 Allergy status to penicillin: Secondary | ICD-10-CM | POA: Insufficient documentation

## 2019-11-02 DIAGNOSIS — I712 Thoracic aortic aneurysm, without rupture: Secondary | ICD-10-CM | POA: Diagnosis not present

## 2019-11-02 DIAGNOSIS — D6869 Other thrombophilia: Secondary | ICD-10-CM | POA: Diagnosis not present

## 2019-11-02 DIAGNOSIS — F1021 Alcohol dependence, in remission: Secondary | ICD-10-CM | POA: Diagnosis not present

## 2019-11-02 DIAGNOSIS — Z96643 Presence of artificial hip joint, bilateral: Secondary | ICD-10-CM | POA: Insufficient documentation

## 2019-11-02 DIAGNOSIS — I25810 Atherosclerosis of coronary artery bypass graft(s) without angina pectoris: Secondary | ICD-10-CM | POA: Insufficient documentation

## 2019-11-02 DIAGNOSIS — F419 Anxiety disorder, unspecified: Secondary | ICD-10-CM | POA: Insufficient documentation

## 2019-11-02 DIAGNOSIS — K219 Gastro-esophageal reflux disease without esophagitis: Secondary | ICD-10-CM | POA: Insufficient documentation

## 2019-11-02 DIAGNOSIS — Z881 Allergy status to other antibiotic agents status: Secondary | ICD-10-CM | POA: Diagnosis not present

## 2019-11-02 DIAGNOSIS — J449 Chronic obstructive pulmonary disease, unspecified: Secondary | ICD-10-CM | POA: Insufficient documentation

## 2019-11-02 DIAGNOSIS — Z87891 Personal history of nicotine dependence: Secondary | ICD-10-CM | POA: Insufficient documentation

## 2019-11-02 DIAGNOSIS — Z8249 Family history of ischemic heart disease and other diseases of the circulatory system: Secondary | ICD-10-CM | POA: Diagnosis not present

## 2019-11-02 DIAGNOSIS — Z951 Presence of aortocoronary bypass graft: Secondary | ICD-10-CM | POA: Insufficient documentation

## 2019-11-02 DIAGNOSIS — Z79899 Other long term (current) drug therapy: Secondary | ICD-10-CM | POA: Insufficient documentation

## 2019-11-02 DIAGNOSIS — F329 Major depressive disorder, single episode, unspecified: Secondary | ICD-10-CM | POA: Diagnosis not present

## 2019-11-02 DIAGNOSIS — Z6833 Body mass index (BMI) 33.0-33.9, adult: Secondary | ICD-10-CM | POA: Insufficient documentation

## 2019-11-02 DIAGNOSIS — G4733 Obstructive sleep apnea (adult) (pediatric): Secondary | ICD-10-CM | POA: Insufficient documentation

## 2019-11-02 DIAGNOSIS — I4819 Other persistent atrial fibrillation: Secondary | ICD-10-CM

## 2019-11-02 DIAGNOSIS — Z800 Family history of malignant neoplasm of digestive organs: Secondary | ICD-10-CM | POA: Insufficient documentation

## 2019-11-02 DIAGNOSIS — Z794 Long term (current) use of insulin: Secondary | ICD-10-CM | POA: Diagnosis not present

## 2019-11-02 DIAGNOSIS — R9431 Abnormal electrocardiogram [ECG] [EKG]: Secondary | ICD-10-CM | POA: Insufficient documentation

## 2019-11-02 LAB — BASIC METABOLIC PANEL
Anion gap: 13 (ref 5–15)
BUN: 10 mg/dL (ref 8–23)
CO2: 19 mmol/L — ABNORMAL LOW (ref 22–32)
Calcium: 9.7 mg/dL (ref 8.9–10.3)
Chloride: 105 mmol/L (ref 98–111)
Creatinine, Ser: 0.94 mg/dL (ref 0.61–1.24)
GFR calc Af Amer: >60 mL/min (ref >60)
GFR calc non Af Amer: >60 mL/min (ref >60)
Glucose, Bld: 98 mg/dL (ref 70–99)
Potassium: 4.4 mmol/L (ref 3.5–5.1)
Sodium: 137 mmol/L (ref 135–145)

## 2019-11-02 LAB — CBC
HCT: 38 % — ABNORMAL LOW (ref 39.0–52.0)
Hemoglobin: 12 g/dL — ABNORMAL LOW (ref 13.0–17.0)
MCH: 31.3 pg (ref 26.0–34.0)
MCHC: 31.6 g/dL (ref 30.0–36.0)
MCV: 99.2 fL (ref 80.0–100.0)
Platelets: 227 10*3/uL (ref 150–400)
RBC: 3.83 MIL/uL — ABNORMAL LOW (ref 4.22–5.81)
RDW: 13.4 % (ref 11.5–15.5)
WBC: 5.5 10*3/uL (ref 4.0–10.5)
nRBC: 0 % (ref 0.0–0.2)

## 2019-11-02 LAB — MAGNESIUM: Magnesium: 2.1 mg/dL (ref 1.7–2.4)

## 2019-11-02 NOTE — Progress Notes (Signed)
Primary Care Physician: [name], MD Referring Physician: Dr. Rayann Heman Primary Cardiologist: Dr Martinique  Tarance D Mcsweeney is a 69 y.o. male with a h/o CABG in 2011, done in [place], HTN, HLD, DM, Atrial fibrillation,alcohol use, and OSA. He underwent catheter ablation for atrial fibrillation by Dr. Rayann Heman 03/31/18.He was followed by Dr. Radford Pax in sleep clinic, but unable to tolerate CPAP and turned in his equipment. He was previously on Xarelto for a CHADS2VASC score of 3. Patient was admitted 10/24/19 for GI bleeding and alcohol withdrawal. EP was consulted to assist with dofetilide reloading. His Xarelto was held on admission and was not resumed at discharge. Afib burden on ILR is 0.2% which was felt to be more related to PACs than true afib. Since his hospitalization, he reports that he has done well with no heart racing or palpitations or blood in his stool.  Today, he denies symptoms of palpitations, chest pain, mild increase of shortness of breath and fatigue,  orthopnea, PND, lower extremity edema, dizziness, presyncope, syncope, or neurologic sequela. The patient is tolerating medications without difficulties and is otherwise without complaint today.   Past Medical History:  Diagnosis Date  . Allergy   . Anxiety    history of PTSD following CABG  . Ascending aortic aneurysm ([place]) 01/31/2018   43 x 42 mm, pt unaware  . Asthma   . Cardiomegaly 10/17/2017  . Colitis- colonoscopy 2014 07/13/2015  . COPD (chronic obstructive pulmonary disease) ([place])   . Coronary artery disease    x 6  . Depression   . Diabetes mellitus without complication ([place])   . Family history of polyps in the colon   . Finger dislocation    Left pinkie  . GERD (gastroesophageal reflux disease)   . Gout   . H/O atrial fibrillation without current medication    following CABG with no documented episodes since then.  . Heart palpitations   . Hx of adenomatous colonic polyps 08/12/2010  . Hyperlipidemia   .  Hypertension   . OA (osteoarthritis)   . OSA (obstructive sleep apnea)    Mild, has not received CPAP yet  . Prediabetes   . RLS (restless legs syndrome)   . 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 ANKLE FRACTURE SURGERY Right 1991  . APPENDECTOMY    . ATRIAL FIBRILLATION ABLATION N/A 03/31/2018   Procedure: ATRIAL FIBRILLATION ABLATION;  Surgeon: [name], MD;  Location: [facility];  Service: Cardiovascular;  Laterality: N/A;  . CARDIAC ELECTROPHYSIOLOGY MAPPING AND ABLATION    . COLONOSCOPY W/ BIOPSIES  2017   x7  . CORONARY ANGIOPLASTY WITH STENT PLACEMENT    . CORONARY ARTERY BYPASS GRAFT    . FINGER SURGERY  04/2018   Small finger left hand  . implantable loop recorder placement  07/02/2019   Medtronic Reveal [place] model [id number] ([place] [id number]) implanted in office by Dr Rayann Heman  . TEE WITHOUT CARDIOVERSION N/A 03/30/2018   Procedure: TRANSESOPHAGEAL ECHOCARDIOGRAM (TEE);  Surgeon: [name], MD;  Location: [facility];  Service: Cardiovascular;  Laterality: N/A;  . TOTAL HIP ARTHROPLASTY Left   . TOTAL HIP ARTHROPLASTY Right 09/12/2018   Procedure: TOTAL HIP ARTHROPLASTY ANTERIOR APPROACH;  Surgeon: [name], MD;  Location: [facility];  Service: Orthopedics;  Laterality: Right;  70 mins    Current Outpatient Medications  Medication Sig Dispense Refill  . allopurinol (ZYLOPRIM) 300 MG tablet Take 1 tablet (  300 mg total) by mouth daily. 90 tablet 3  . Coenzyme Q10 (CO Q-10) 100 MG CAPS Take 100 mg by mouth daily.     . diphenhydrAMINE HCl, Sleep, (ZZZQUIL) 25 MG CAPS Take 25 mg by mouth at bedtime as needed (for sleep).    . dofetilide (TIKOSYN) 125 MCG capsule Take 1 capsule (125 mcg total) by mouth 2 (two) times daily. May have 90 day refills if patient prefers 60 capsule 11  . fenofibrate micronized (LOFIBRA) 200 MG capsule Take 1 capsule (200 mg total) by mouth daily before breakfast. 90 capsule 3   . fluticasone (FLONASE) 50 MCG/ACT nasal spray Place 1 spray into both nostrils daily.     . Fluticasone-Umeclidin-Vilant (TRELEGY ELLIPTA) 100-62.5-25 MCG/INH AEPB Inhale 1 puff into the lungs daily. 60 each 3  . gabapentin (NEURONTIN) 300 MG capsule Take two to three tablets at night as needed for restless leg symptoms (Patient taking differently: Take 600-900 mg by mouth at bedtime as needed (for restless leg symptoms). ) 270 capsule 3  . glucose blood (ONETOUCH VERIO) test strip Test once daily.  Dx E11.9 100 each 3  . Icosapent Ethyl (VASCEPA) 1 g CAPS Take 2 capsules by mouth 2 (two) times daily.     Marland Kitchen JARDIANCE 25 MG TABS tablet TAKE 1 TABLET BY MOUTH DAILY (Patient taking differently: Take 25 mg by mouth daily. ) 90 tablet 0  . LORazepam (ATIVAN) 0.5 MG tablet Take 1 tablet (0.5 mg total) by mouth every 8 (eight) hours as needed for anxiety. 90 tablet 5  . losartan (COZAAR) 100 MG tablet Take 1 tablet (100 mg total) by mouth daily. 90 tablet 0  . metFORMIN (GLUCOPHAGE) 1000 MG tablet TAKE 1 TABLET BY MOUTH TWICE DAILY WITH A MEAL (Patient taking differently: Take 1,000 mg by mouth 2 (two) times daily with a meal. ) 180 tablet 1  . metoprolol tartrate (LOPRESSOR) 100 MG tablet TAKE 1 TABLET(100 MG) BY MOUTH TWICE DAILY (Patient taking differently: Take 50 mg by mouth 2 (two) times daily. ) 180 tablet 1  . montelukast (SINGULAIR) 10 MG tablet TAKE 1 TABLET BY MOUTH AT BEDTIME (Patient taking differently: Take 10 mg by mouth at bedtime. ) 90 tablet 1  . potassium chloride SA (KLOR-CON) 20 MEQ tablet Take 2 tablets (40 mEq total) by mouth 2 (two) times daily. 120 tablet 0  . RABEprazole (ACIPHEX) 20 MG tablet Take 1 tablet (20 mg total) by mouth daily. 90 tablet 1  . rosuvastatin (CRESTOR) 40 MG tablet Take 1 tablet (40 mg total) by mouth daily. 90 tablet 3  . sitaGLIPtin (JANUVIA) 100 MG tablet Take 1 tablet (100 mg total) by mouth daily. 90 tablet 1  . TOUJEO SOLOSTAR 300 UNIT/ML SOPN INJECT  60 UNITS AS DIRECTED DAILY (Patient taking differently: Take 60 Units by mouth daily at 12 noon. ) 33 mL 1   No current facility-administered medications for this encounter.    Allergies  Allergen Reactions  . Amoxicillin Rash and Other (See Comments)    * SEVERE RASH IN GROIN AREA Has patient had a PCN reaction causing immediate rash, facial/tongue/throat swelling, SOB or lightheadedness with hypotension: no Has patient had a PCN reaction causing severe rash involving mucus membranes or skin necrosis: no Has patient had a PCN reaction that required hospitalization: no Has patient had a PCN reaction occurring within the last 10 years: yes If all of the above answers are "NO", then may proceed with Cephalosporin use.   . Augmentin [  Amoxicillin-Pot Clavulanate] Rash and Other (See Comments)    * SEVERE RASH IN GROIN AREA  . Azithromycin Rash and Other (See Comments)    * SEVERE RASH IN GROIN AREA  . Clindamycin/Lincomycin Rash  . Keflex [Cephalexin] Rash    Social History   Socioeconomic History  . Marital status: Married    Spouse name: Not on file  . Number of children: Not on file  . Years of education: Not on file  . Highest education level: Not on file  Occupational History  . Occupation: retired  Tobacco Use  . Smoking status: Former Smoker    Packs/day: 1.00    Years: 7.00    Pack years: 7.00    Types: Cigarettes    Quit date: 10/04/1977    Years since quitting: 42.1  . Smokeless tobacco: Never Used  . Tobacco comment: never smoked over 1 pack   Substance and Sexual Activity  . Alcohol use: Yes    Alcohol/week: 3.0 - 4.0 standard drinks    Types: 1 Glasses of wine, 2 - 3 Shots of liquor per week  . Drug use: No    Comment: Smoked Marijuana back in 1970s  . Sexual activity: Yes  Other Topics Concern  . 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 Difficulty of Paying Living Expenses: Not on file  Food Insecurity:   . Worried About Charity fundraiser in the Last Year: Not on file  . Ran Out of Food in the Last Year: Not on file  Transportation Needs:   . Lack of Transportation (Medical): Not on file  . Lack of Transportation (Non-Medical): Not on file  Physical Activity:   . Days of Exercise per Week: Not on file  . Minutes of Exercise per Session: Not on file  Stress:   . Feeling of Stress : Not on file  Social Connections:   . Frequency of Communication with Friends and Family: Not on file  . Frequency of Social Gatherings with Friends and Family: Not on file  . Attends Religious Services: Not on file  . Active Member of Clubs or Organizations: Not on file  . Attends Archivist Meetings: Not on file  . Marital Status: Not on file  Intimate Partner Violence:   . Fear of Current or Ex-Partner: Not on file  . Emotionally Abused: Not on file  . Physically Abused: Not on file  . Sexually Abused: Not on file    Family History  Problem Relation Age of Onset  . Colon cancer Mother   . Heart disease Paternal Grandmother   . Heart disease Paternal Grandfather     ROS- All systems are reviewed and negative except as per the HPI above  Physical Exam: Vitals:   11/02/19 0935  BP: 110/64  Pulse: 87  Weight: 114.9 kg  Height: 5\' 10"  (1.778 m)   Wt Readings from Last 3 Encounters:  11/02/19 114.9 kg  10/27/19 116.4 kg  10/01/19 117.9 kg    Labs: Lab Results  Component Value Date   NA 137 10/27/2019   K 3.8 10/27/2019   CL 104 10/27/2019   CO2 22 10/27/2019   GLUCOSE 128 (H) 10/27/2019   BUN 10 10/27/2019   CREATININE 0.84  10/27/2019   CALCIUM 8.8 (L) 10/27/2019   MG 2.0 10/27/2019   Lab Results  Component Value Date   INR 1.1 10/24/2019   Lab Results  Component Value Date   CHOL 142 08/08/2019   HDL 37 (L)  08/08/2019   LDLCALC 65 08/08/2019   TRIG 247 (H) 08/08/2019    GEN- The patient is well appearing obese male, alert and oriented x 3 today.   HEENT-head normocephalic, atraumatic, sclera clear, conjunctiva pink, hearing intact, trachea midline. Lungs- Clear to ausculation bilaterally, normal work of breathing Heart- Regular rate and rhythm, no murmurs, rubs or gallops  GI- soft, NT, ND, + BS Extremities- no clubbing, cyanosis, or edema MS- no significant deformity or atrophy Skin- no rash or lesion Psych- euthymic mood, full affect Neuro- strength and sensation are intact   EKG- SR HR 87, PR 200, QRS 76, QTc 483  Epic records reviewed   Assessment and Plan: 1. Persistent afib S/o ablation 03/2018. Reloaded on dofetilide 1/21-1/23/21 Patient appears to be maintaining SR. Continue dofetilide 125 mcg BID. QT stable. Bmet/mag/CBC today. Anticoagulation on hold given bleeding on recent admission. Per Dr Rayann Heman, will track afib burden on device to guide decision about anticoagulation.  ? Watchman candidate.  This patients CHA2DS2-VASc Score and unadjusted Ischemic Stroke Rate (% per year) is equal to 4.8 % stroke rate/year from a score of 4  Above score calculated as 1 point each if present [CHF, HTN, DM, Vascular=MI/PAD/Aortic Plaque, Age if 65-74, or Male] Above score calculated as 2 points each if present [Age > 75, or Stroke/TIA/TE]   2. OSA The importance of adequate treatment of sleep apnea was discussed today in order to improve our ability to maintain sinus rhythm long term. Patient not on CPAP therapy. Followed by Dr Radford Pax.  3. CAD No anginal symptoms. Followed by Dr Martinique.  4. HTN Stable, no changes today.  5. Obesity Body mass index is 36.33 kg/m. Lifestyle modification was discussed and encouraged including regular physical activity and weight reduction.   Follow up with Dr Jackalyn Lombard office in 2-3 weeks. AF clinic in 4 months.   [facility] [address] [place], [place] [address] [phone]

## 2019-11-05 ENCOUNTER — Ambulatory Visit: Payer: Medicare Other | Admitting: Family Medicine

## 2019-11-05 ENCOUNTER — Other Ambulatory Visit: Payer: Self-pay

## 2019-11-05 ENCOUNTER — Encounter: Payer: Self-pay | Admitting: Family Medicine

## 2019-11-05 VITALS — BP 120/68 | HR 77 | Temp 97.4°F | Ht 70.0 in | Wt 254.6 lb

## 2019-11-05 DIAGNOSIS — K625 Hemorrhage of anus and rectum: Secondary | ICD-10-CM

## 2019-11-05 DIAGNOSIS — I480 Paroxysmal atrial fibrillation: Secondary | ICD-10-CM

## 2019-11-05 DIAGNOSIS — E1159 Type 2 diabetes mellitus with other circulatory complications: Secondary | ICD-10-CM | POA: Diagnosis not present

## 2019-11-05 DIAGNOSIS — H1132 Conjunctival hemorrhage, left eye: Secondary | ICD-10-CM

## 2019-11-05 DIAGNOSIS — I10 Essential (primary) hypertension: Secondary | ICD-10-CM

## 2019-11-05 DIAGNOSIS — I152 Hypertension secondary to endocrine disorders: Secondary | ICD-10-CM

## 2019-11-05 DIAGNOSIS — E1165 Type 2 diabetes mellitus with hyperglycemia: Secondary | ICD-10-CM | POA: Diagnosis not present

## 2019-11-05 NOTE — Progress Notes (Signed)
Subjective:     Patient ID: Adam [name], male   DOB: 1951/04/04, 69 y.o.   MRN: [id number]  HPI This is a transition care management follow-up visit.  Adam [name] has multiple chronic problems including history of obesity, CAD, hypertension, type 2 diabetes, atrial fibrillation, sleep apnea.  He presented to the hospital on the 20th with rectal bleeding of bright red blood.  The time of admission he was taking Xarelto regularly for his A. fib history.  His hemoglobin was stable at the time of admission and remained stable during hospitalization.  He had hemoglobin 12.7 on admission and this dropped to 10.6.  He has had subsequent follow-up with cardiology and hemoglobin was 12.0 there.  He never had any associated abdominal pain.  No nausea or vomiting.  He denies any prior history of significant GI bleeds.  He does have history of known sigmoid diverticulosis.  He has not had any further bleeding since discharge.  He remains off Xarelto at this time.  He has history of colon polyps and also his mom died of colon cancer.  His last colonoscopy was 2017 and he is due for follow-up.  He has follow-up with GI in a few days  There was concern for some alcohol withdrawal during admission.  He had last drink about 48 hours prior to admission.  He had some hypertension changes along with some tachycardia and tachypnea and tremors and diaphoresis.  He was placed on alcohol withdrawal protocol.  He states he was drinking 2-4 drinks of scotch per day.  These represent about 3 ounces per drink.  He has scaled back slightly since discharged but still drinks about 2 drinks per day.  He remains on metoprolol and Tikosyn at home.  He is followed through A. fib clinic and is being monitored for any recurrent A. Fib.  Regarding diabetes this is been relatively stable.  He had A1c of 7.1% on 09/24/2019.  He remains on Jardiance, Metformin, and Januvia  Other new problem is left subconjunctival hemorrhage.  He not sure  exactly which day this appeared.  This appeared spontaneously.  No vision difficulties.  No eye pain.  No blurred vision.  Past Medical History:  Diagnosis Date  . Allergy   . Anxiety    history of PTSD following CABG  . Ascending aortic aneurysm ([place]) 01/31/2018   43 x 42 mm, pt unaware  . Asthma   . Cardiomegaly 10/17/2017  . Colitis- colonoscopy 2014 07/13/2015  . COPD (chronic obstructive pulmonary disease) ([place])   . Coronary artery disease    x 6  . Depression   . Diabetes mellitus without complication ([place])   . Family history of polyps in the colon   . Finger dislocation    Left pinkie  . GERD (gastroesophageal reflux disease)   . Gout   . H/O atrial fibrillation without current medication    following CABG with no documented episodes since then.  . Heart palpitations   . Hx of adenomatous colonic polyps 08/12/2010  . Hyperlipidemia   . Hypertension   . OA (osteoarthritis)   . OSA (obstructive sleep apnea)    Mild, has not received CPAP yet  . Prediabetes   . RLS (restless legs syndrome)   . 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 ANKLE FRACTURE SURGERY Right 1991  . APPENDECTOMY    . ATRIAL FIBRILLATION ABLATION N/A 03/31/2018   Procedure: ATRIAL FIBRILLATION  ABLATION;  Surgeon: [name], MD;  Location: [facility];  Service: Cardiovascular;  Laterality: N/A;  . CARDIAC ELECTROPHYSIOLOGY MAPPING AND ABLATION    . COLONOSCOPY W/ BIOPSIES  2017   x7  . CORONARY ANGIOPLASTY WITH STENT PLACEMENT    . CORONARY ARTERY BYPASS GRAFT    . FINGER SURGERY  04/2018   Small finger left hand  . implantable loop recorder placement  07/02/2019   Medtronic Reveal [place] model [id number] ([place] [id number]) implanted in office by Dr Rayann Heman  . TEE WITHOUT CARDIOVERSION N/A 03/30/2018   Procedure: TRANSESOPHAGEAL ECHOCARDIOGRAM (TEE);  Surgeon: [name], MD;  Location: [facility];  Service: Cardiovascular;   Laterality: N/A;  . TOTAL HIP ARTHROPLASTY Left   . TOTAL HIP ARTHROPLASTY Right 09/12/2018   Procedure: TOTAL HIP ARTHROPLASTY ANTERIOR APPROACH;  Surgeon: [name], MD;  Location[facility];  Service: Orthopedics;  Laterality: Right;  70 mins    reports that he quit smoking about 42 years ago. His smoking use included cigarettes. He has a 7.00 pack-year smoking history. He has never used smokeless tobacco. He reports current alcohol use of about 3.0 - 4.0 standard drinks of alcohol per week. He reports that he does not use drugs. family history includes Colon cancer in his mother; Heart disease in his paternal grandfather and paternal grandmother. Allergies  Allergen Reactions  . Amoxicillin Rash and Other (See Comments)    * SEVERE RASH IN GROIN AREA Has patient had a PCN reaction causing immediate rash, facial/tongue/throat swelling, SOB or lightheadedness with hypotension: no Has patient had a PCN reaction causing severe rash involving mucus membranes or skin necrosis: no Has patient had a PCN reaction that required hospitalization: no Has patient had a PCN reaction occurring within the last 10 years: yes If all of the above answers are "NO", then may proceed with Cephalosporin use.   . Augmentin [Amoxicillin-Pot Clavulanate] Rash and Other (See Comments)    * SEVERE RASH IN GROIN AREA  . Azithromycin Rash and Other (See Comments)    * SEVERE RASH IN GROIN AREA  . Clindamycin/Lincomycin Rash  . Keflex [Cephalexin] Rash      Review of Systems  Constitutional: Negative for chills, fatigue and fever.  Eyes: Negative for visual disturbance.  Respiratory: Negative for cough, chest tightness and shortness of breath.   Cardiovascular: Negative for chest pain, palpitations and leg swelling.  Gastrointestinal: Negative for abdominal pain.  Endocrine: Negative for polydipsia and polyuria.  Genitourinary: Negative for dysuria.  Neurological: Negative for dizziness, syncope, weakness,  light-headedness and headaches.       Objective:   Physical Exam Vitals reviewed.  Constitutional:      Appearance: Normal appearance.  Eyes:     Comments: Left subconjunctival hemorrhage.  No hyphema.  Pupils equal round reactive to light  Cardiovascular:     Rate and Rhythm: Normal rate and regular rhythm.  Pulmonary:     Effort: Pulmonary effort is normal.     Breath sounds: Normal breath sounds.  Abdominal:     Palpations: There is no mass.     Tenderness: There is no abdominal tenderness. There is no guarding or rebound.  Musculoskeletal:     Right lower leg: No edema.     Left lower leg: No edema.  Neurological:     Mental Status: He is alert.        Assessment:     #1 recent lower GI bleed.  Clinically stable.  Question diverticulosis bleed.  His  lowest hemoglobin was 10.6 and he had follow-up through cardiology few days ago stable at 12.0.  Pending follow-up with GI as above  #2 history of chronic atrial fibrillation.  Currently sinus rhythm.  Rate stable.  Taken off Xarelto  #3 history of CAD symptomatically stable  #4 type 2 diabetes with fair control with recent A1c 7.1%  #5 history of alcohol use and possible withdrawal issues as above.  #6 hypertension stable with recent discontinuation of HCTZ  #7 benign appearing left subconjunctival hemorrhage    Plan:     -Did not obtain follow-up labs since these were done through cardiology a few days ago and these were reviewed with patient -Keep GI appointment as scheduled -Follow-up immediately for any recurrent GI bleeding -Reassurance regarding benign appearing left subconjunctival hemorrhage -We a long talk about alcohol use.  We strongly advised that he quit this altogether if unable to scale back. -Recommend routine follow-up in 3 months to reassess and sooner as needed.  Will check A1c again at follow-up  [name] MD [facility] at [facility]

## 2019-11-05 NOTE — Patient Instructions (Signed)
Subconjunctival Hemorrhage Subconjunctival hemorrhage is bleeding that happens between the white part of your eye (sclera) and the clear membrane that covers the outside of your eye (conjunctiva). There are many tiny blood vessels near the surface of your eye. A subconjunctival hemorrhage happens when one or more of these vessels breaks and bleeds, causing a red patch to appear on your eye. This is similar to a bruise. Depending on the amount of bleeding, the red patch may only cover a small area of your eye or it may cover the entire visible part of the sclera. If a lot of blood collects under the conjunctiva, there may also be swelling. Subconjunctival hemorrhages do not affect your vision or cause pain, but your eye may feel irritated if there is swelling. Subconjunctival hemorrhages usually do not require treatment, and they usually disappear on their own within two weeks. What are the causes? This condition may be caused by:  Mild trauma, such as rubbing your eye too hard.  Blunt injuries, such as from playing sports or having contact with a deployed airbag.  Coughing, sneezing, or vomiting.  Straining, such as when lifting a heavy object.  High blood pressure.  Recent eye surgery.  Diabetes.  Certain medicines, especially blood thinners (anticoagulants).  Other conditions, such as eye tumors, bleeding disorders, or blood vessel abnormalities. Subconjunctival hemorrhages can also happen without an obvious cause. What are the signs or symptoms? Symptoms of this condition include:  A bright red or dark red patch on the white part of the eye. The red area may: ? Spread out to cover a larger area of the eye before it goes away. ? Turn brownish-yellow before it goes away.  Swelling around the eye.  Mild eye irritation. How is this diagnosed? This condition is diagnosed with a physical exam. If your subconjunctival hemorrhage was caused by trauma, your health care provider may refer  you to an eye specialist (ophthalmologist) or another specialist to check for other injuries. You may have other tests, including:  An eye exam.  A blood pressure check.  Blood tests to check for bleeding disorders. If your subconjunctival hemorrhage was caused by trauma, X-rays or a CT scan may be done to check for other injuries. How is this treated? Usually, treatment is not needed for this condition. If you have discomfort, your health care provider may recommend eye drops or cold compresses. Follow these instructions at home:  Take over-the-counter and prescription medicines only as directed by your health care provider.  Use eye drops or cold compresses to help with discomfort as directed by your health care provider.  Avoid activities, things, and environments that may irritate or injure your eye.  Keep all follow-up visits as told by your health care provider. This is important. Contact a health care provider if:  You have pain in your eye.  The bleeding does not go away within 3 weeks.  You keep getting new subconjunctival hemorrhages. Get help right away if:  Your vision changes or you have difficulty seeing.  You suddenly develop severe sensitivity to light.  You develop a severe headache, persistent vomiting, confusion, or abnormal tiredness (lethargy).  Your eye seems to bulge or protrude from your eye socket.  You develop unexplained bruises on your body.  You have unexplained bleeding in another area of your body. Summary  Subconjunctival hemorrhage is bleeding that happens between the white part of your eye and the clear membrane that covers the outside of your eye.  This condition   is similar to a bruise.  Subconjunctival hemorrhages usually do not require treatment, and they usually disappear on their own within two weeks.  Use eye drops or cold compresses to help with discomfort as directed by your health care provider. This information is not  intended to replace advice given to you by your health care provider. Make sure you discuss any questions you have with your health care provider. Document Revised: 03/07/2019 Document Reviewed: 06/21/2018 Elsevier Patient Education  2020 Elsevier Inc.  

## 2019-11-07 ENCOUNTER — Ambulatory Visit: Payer: Medicare Other | Admitting: Student

## 2019-11-08 ENCOUNTER — Ambulatory Visit: Payer: Medicare Other | Admitting: Nurse Practitioner

## 2019-11-08 ENCOUNTER — Encounter: Payer: Self-pay | Admitting: Nurse Practitioner

## 2019-11-08 ENCOUNTER — Ambulatory Visit (INDEPENDENT_AMBULATORY_CARE_PROVIDER_SITE_OTHER): Payer: Medicare Other | Admitting: *Deleted

## 2019-11-08 ENCOUNTER — Encounter: Payer: Medicare Other | Admitting: Student

## 2019-11-08 VITALS — BP 126/82 | HR 80 | Temp 97.6°F | Ht 70.0 in | Wt 252.0 lb

## 2019-11-08 DIAGNOSIS — K625 Hemorrhage of anus and rectum: Secondary | ICD-10-CM

## 2019-11-08 DIAGNOSIS — K648 Other hemorrhoids: Secondary | ICD-10-CM | POA: Diagnosis not present

## 2019-11-08 DIAGNOSIS — I4819 Other persistent atrial fibrillation: Secondary | ICD-10-CM | POA: Diagnosis not present

## 2019-11-08 LAB — CUP PACEART REMOTE DEVICE CHECK
Date Time Interrogation Session: 20210204002300
Implantable Pulse Generator Implant Date: 20200927200000

## 2019-11-08 MED ORDER — HYDROCORTISONE (PERIANAL) 2.5 % EX CREA: TOPICAL_CREAM | CUTANEOUS | 1 refills | Status: DC

## 2019-11-08 NOTE — Patient Instructions (Addendum)
If you are age 70 or older, your body mass index should be between 23-30. Your Body mass index is 36.16 kg/m. If this is out of the aforementioned range listed, please consider follow up with your Primary Care Provider.  If you are age 4 or younger, your body mass index should be between 19-25. Your Body mass index is 36.16 kg/m. If this is out of the aformentioned range listed, please consider follow up with your Primary Care Provider.   Use Anusol cream rectally at bedtime for 10 nights.   Use Metamucil daily in 8 oz of water or juice.  Call the office and ask for the nurse if any rectal bleeding occurs.   It was a pleasure to see you today!  P.Guenther, ARNP

## 2019-11-08 NOTE — Progress Notes (Signed)
IMPRESSION and PLAN:    79.  69 year old male with multiple medical problems recently hospitalized with painless rectal bleeding on Xarelto.  Hemoglobin declined a couple of grams from 12.7 to 10.5 but spontaneously improved to 12 without need for transfusion.  He may have had a mild diverticular hemorrhage versus bleeding from internal hemorrhoids (seen on anoscopy today).  He has history of ileitis, not seen on last colonoscopy in 2017 however  --No further bleeding.  Xarelto stopped by cardiology though I do not know if there plans to restart it at some point.  Patient says he will not take it again no matter what.  --Anusol cream .  Apply inside rectum nightly x10 days --Some recent constipation. Continue with good hydration with at least 6 to 8 glasses of water daily.  --Call us for recurrent bleeding.  He might be a candidate for internal hemorrhoid banding --Patient has a history of colon polyps, he is due for surveillance colonoscopy in 2022.  2. Etoh withdrawal (recent admission).       HPI:    Primary GI: Adam [name]  Chief complaint : Follow-up on GI bleed   Adam [name] is a 69 y.o. male with a multiple medical problems including but not limited to COPD, ascending aortic aneurysm, hyperlipidemia, anxiety/depression, OSA,  CAD/CABG, hypertension, atrial fibrillation( Xarelto currently on hold), diabetes, obesity, colon polyps, mild diverticulosis, and GERD. Patient was hospitalized for a few days in late January for evaluation of rectal bleeding and possible alcohol withdrawal.  He was given Ativan per [facility] protocol.  It does not appear that we were consulted during that admission but records reviewed.  Cardiology evaluated and stopped Xarelto.   The painless rectal bleeding started on 10/24/2019.  He describes the blood as being a large amount with each bowel movement. He has recently struggled with constipation. . Presenting hemoglobin was 12.7, it declined to  10.5. No blood transfusion required.  A follow-up hemoglobin on 11/02/2019 was 12.  Patient has remained off Xarelto, says he is not going back on it no matter what. He hasn't had any further bleeding.   Review of systems:     No chest pain, no SOB, no fevers, no urinary sx   Past Medical History:  Diagnosis Date  . Allergy   . Anxiety    history of PTSD following CABG  . Ascending aortic aneurysm ([place]) 01/31/2018   43 x 42 mm, pt unaware  . Asthma   . Cardiomegaly 10/17/2017  . Colitis- colonoscopy 2014 07/13/2015  . COPD (chronic obstructive pulmonary disease) ([place])   . Coronary artery disease    x 6  . Depression   . Diabetes mellitus without complication ([place])   . Family history of polyps in the colon   . Finger dislocation    Left pinkie  . GERD (gastroesophageal reflux disease)   . Gout   . H/O atrial fibrillation without current medication    following CABG with no documented episodes since then.  . Heart palpitations   . Hx of adenomatous colonic polyps 08/12/2010  . Hyperlipidemia   . Hypertension   . OA (osteoarthritis)   . OSA (obstructive sleep apnea)    Mild, has not received CPAP yet  . Prediabetes   . RLS (restless legs syndrome)   . 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 allopurinol (ZYLOPRIM) 300 MG tablet Take 1 tablet (300 mg total) by mouth daily. 90 tablet 3  . Coenzyme Q10 (CO Q-10) 100 MG CAPS Take 100 mg by mouth daily.     . diphenhydrAMINE HCl, Sleep, (ZZZQUIL) 25 MG CAPS Take 25 mg by mouth at bedtime as needed (for sleep).    . dofetilide (TIKOSYN) 125 MCG capsule Take 1 capsule (125 mcg total) by mouth 2 (two) times daily. May have 90 day refills if patient  prefers 60 capsule 11  . fenofibrate micronized (LOFIBRA) 200 MG capsule Take 1 capsule (200 mg total) by mouth daily before breakfast. 90 capsule 3  . fluticasone (FLONASE) 50 MCG/ACT nasal spray Place 1 spray into both nostrils daily.     . Fluticasone-Umeclidin-Vilant (TRELEGY ELLIPTA) 100-62.5-25 MCG/INH AEPB Inhale 1 puff into the lungs daily. 60 each 3  . gabapentin (NEURONTIN) 300 MG capsule Take two to three tablets at night as needed for restless leg symptoms (Patient taking differently: Take 600-900 mg by mouth at bedtime as needed (for restless leg symptoms). ) 270 capsule 3  . glucose blood (ONETOUCH VERIO) test strip Test once daily.  Dx E11.9 100 each 3  . Icosapent Ethyl (VASCEPA) 1 g CAPS Take 2 capsules by mouth 2 (two) times daily.     Marland Kitchen JARDIANCE 25 MG TABS tablet TAKE 1 TABLET BY MOUTH DAILY (Patient taking differently: Take 25 mg by mouth daily. ) 90 tablet 0  . LORazepam (ATIVAN) 0.5 MG tablet Take 1 tablet (0.5 mg total) by mouth every 8 (eight) hours as needed for anxiety. 90 tablet 5  . losartan (COZAAR) 100 MG tablet Take 1 tablet (100 mg total) by mouth daily. 90 tablet 0  . metFORMIN (GLUCOPHAGE) 1000 MG tablet TAKE 1 TABLET BY MOUTH TWICE DAILY WITH A MEAL (Patient taking differently: Take 1,000 mg by mouth 2 (two) times daily with a meal. ) 180 tablet 1  . metoprolol tartrate (LOPRESSOR) 100 MG tablet TAKE 1 TABLET(100 MG) BY MOUTH TWICE DAILY (Patient taking differently: Take 50 mg by mouth 2 (two) times daily. ) 180 tablet 1  . montelukast (SINGULAIR) 10 MG tablet TAKE 1 TABLET BY MOUTH AT BEDTIME (Patient taking differently: Take 10 mg by mouth at bedtime. ) 90 tablet 1  . potassium chloride SA (KLOR-CON) 20 MEQ tablet Take 2 tablets (40 mEq total) by mouth 2 (two) times daily. 120 tablet 0  . RABEprazole (ACIPHEX) 20 MG tablet Take 1 tablet (20 mg total) by mouth daily. 90 tablet 1  . rosuvastatin (CRESTOR) 40 MG tablet Take 1 tablet (40 mg total) by mouth daily. 90  tablet 3  . sitaGLIPtin (JANUVIA) 100 MG tablet Take 1 tablet (100 mg total) by mouth daily. 90 tablet 1  . TOUJEO SOLOSTAR 300 UNIT/ML SOPN INJECT 60 UNITS AS DIRECTED DAILY (Patient taking differently: Take 60 Units by mouth daily at 12 noon. ) 33 mL 1  . hydrocortisone (ANUSOL-HC) 2.5 % rectal cream Use nightly at bedtime for 10 days 30 g 1   No current facility-administered medications for this visit.    Physical Exam:     BP 126/82   Pulse 80   Temp 97.6 F (36.4 C)   Ht 5\' 10"  (1.778 m)   Wt 252 lb (114.3 kg)   BMI 36.16 kg/m   GENERAL:  Pleasant male in NAD PSYCH: : Cooperative, normal affect PULM: Normal respiratory effort, lungs CTA bilaterally, no wheezing ABDOMEN:  Protuberant,  soft, nontender. No obvious masses,  normal bowel sounds RECTAL: No external lesions.  On anoscopy there were large swollen internal hemorrhoid Musculoskeletal:  Normal muscle tone, normal strength NEURO: Alert and oriented x 3, no focal neurologic deficits  I spent 30 minutes total reviewing records, obtaining history, performing exam, counseling patient and documenting visit / findings.   Adam [name] , NP 11/08/2019, 11:20 AM

## 2019-11-08 NOTE — Progress Notes (Signed)
IL[facility] Remote 

## 2019-11-20 ENCOUNTER — Other Ambulatory Visit: Payer: Self-pay

## 2019-11-20 ENCOUNTER — Ambulatory Visit (INDEPENDENT_AMBULATORY_CARE_PROVIDER_SITE_OTHER): Payer: Medicare Other

## 2019-11-20 VITALS — BP 138/84 | Temp 97.2°F | Ht 70.0 in | Wt 255.8 lb

## 2019-11-20 DIAGNOSIS — Z0000 Encounter for general adult medical examination without abnormal findings: Secondary | ICD-10-CM | POA: Diagnosis not present

## 2019-11-20 NOTE — Patient Instructions (Addendum)
Adam [name] , Thank you for taking time to come for your Medicare Wellness Visit. I appreciate your ongoing commitment to your health goals. Please review the following plan we discussed and let me know if I can assist you in the future.   Screening recommendations/referrals: Colorectal Screening: colonoscopy completed 11/20/2015; due 11/20/2020.  Vision and Dental Exams: Recommended annual ophthalmology exams for early detection of glaucoma and other disorders of the eye Recommended annual dental exams for proper oral hygiene  Diabetic Exams: Diabetic Eye Exam: completed 08/09/2019. Up to date.  Diabetic Foot Exam: completed 09/24/2019. Up to date.   Vaccinations: Influenza vaccine: completed 06/25/2019. Due fall 2021. Pneumococcal vaccine: completed 12/01/2015 & 11/30/2016. Up to date.  Tdap vaccine:completed 10/06/2009; due since 10/07/2019. Marland Kitchen Please call your insurance company to determine your out of pocket expense. You may also receive this vaccine at your local pharmacy or Health Dept. Shingles vaccine: completed 12/02/2016 & 04/02/2017. Up to date.   Advanced directives: Advance directives discussed with you today. Please bring a copy of your POA (Power of [place]) and/or Living Will to your next appointment.  Goals: Recommend to drink at least 6-8 8oz glasses of water per day.  Recommend to exercise for at least 150 minutes per week.  Recommend to remove any items from the home that may cause slips or trips.  Recommend to decrease portion sizes by eating 3 small healthy meals and at least 2 healthy snacks per day.  Recommend to begin DASH (low sodium) diet as directed below  Please consider the following as we discussed:  1. Physical therapy to help balance, gait, and strengthening  2. Counseling via telephone   3. Pain medication  4. Pool exercise to help joint pain  Next appointment: Please schedule your Annual Wellness Visit with your Nurse Health Advisor in one  year.  Preventive Care 70 Years and Older, Male Preventive care refers to lifestyle choices and visits with your health care provider that can promote health and wellness. What does preventive care include?  A yearly physical exam. This is also called an annual well check.  Dental exams once or twice a year.  Routine eye exams. Ask your health care provider how often you should have your eyes checked.  Personal lifestyle choices, including:  Daily care of your teeth and gums.  Regular physical activity.  Eating a healthy diet.  Avoiding tobacco and drug use.  Limiting alcohol use.  Practicing safe sex.  Taking low doses of aspirin every day if recommended by your health care provider..  Taking vitamin and mineral supplements as recommended by your health care provider. What happens during an annual well check? The services and screenings done by your health care provider during your annual well check will depend on your age, overall health, lifestyle risk factors, and family history of disease. Counseling  Your health care provider may ask you questions about your:  Alcohol use.  Tobacco use.  Drug use.  Emotional well-being.  Home and relationship well-being.  Sexual activity.  Eating habits.  History of falls.  Memory and ability to understand (cognition).  Work and work Statistician. Screening  You may have the following tests or measurements:  Height, weight, and BMI.  Blood pressure.  Lipid and cholesterol levels. These may be checked every 5 years, or more frequently if you are over 49 years old.  Skin check.  Lung cancer screening. You may have this screening every year starting at age 60 if you have a 30-pack-year  history of smoking and currently smoke or have quit within the past 15 years.  Fecal occult blood test (FOBT) of the stool. You may have this test every year starting at age 70.  Flexible sigmoidoscopy or colonoscopy. You may have a  sigmoidoscopy every 5 years or a colonoscopy every 10 years starting at age 21.  Prostate cancer screening. Recommendations will vary depending on your family history and other risks.  Hepatitis C blood test.  Hepatitis B blood test.  Sexually transmitted disease (STD) testing.  Diabetes screening. This is done by checking your blood sugar (glucose) after you have not eaten for a while (fasting). You may have this done every 1-3 years.  Abdominal aortic aneurysm (AAA) screening. You may need this if you are a current or former smoker.  Osteoporosis. You may be screened starting at age 39 if you are at high risk. Talk with your health care provider about your test results, treatment options, and if necessary, the need for more tests. Vaccines  Your health care provider may recommend certain vaccines, such as:  Influenza vaccine. This is recommended every year.  Tetanus, diphtheria, and acellular pertussis (Tdap, Td) vaccine. You may need a Td booster every 10 years.  Zoster vaccine. You may need this after age 52.  Pneumococcal 13-valent conjugate (PCV13) vaccine. One dose is recommended after age 11.  Pneumococcal polysaccharide (PPSV23) vaccine. One dose is recommended after age 62. Talk to your health care provider about which screenings and vaccines you need and how often you need them. This information is not intended to replace advice given to you by your health care provider. Make sure you discuss any questions you have with your health care provider. Document Released: 10/17/2015 Document Revised: 06/09/2016 Document Reviewed: 07/22/2015 Elsevier Interactive Patient Education  2017 [place] Prevention in the Home Falls can cause injuries. They can happen to people of all ages. There are many things you can do to make your home safe and to help prevent falls. What can I do on the outside of my home?  Regularly fix the edges of walkways and driveways and fix any  cracks.  Remove anything that might make you trip as you walk through a door, such as a raised step or threshold.  Trim any bushes or trees on the path to your home.  Use bright outdoor lighting.  Clear any walking paths of anything that might make someone trip, such as rocks or tools.  Regularly check to see if handrails are loose or broken. Make sure that both sides of any steps have handrails.  Any raised decks and porches should have guardrails on the edges.  Have any leaves, snow, or ice cleared regularly.  Use sand or salt on walking paths during winter.  Clean up any spills in your garage right away. This includes oil or grease spills. What can I do in the bathroom?  Use night lights.  Install grab bars by the toilet and in the tub and shower. Do not use towel bars as grab bars.  Use non-skid mats or decals in the tub or shower.  If you need to sit down in the shower, use a plastic, non-slip stool.  Keep the floor dry. Clean up any water that spills on the floor as soon as it happens.  Remove soap buildup in the tub or shower regularly.  Attach bath mats securely with double-sided non-slip rug tape.  Do not have throw rugs and other things on the  floor that can make you trip. What can I do in the bedroom?  Use night lights.  Make sure that you have a light by your bed that is easy to reach.  Do not use any sheets or blankets that are too big for your bed. They should not hang down onto the floor.  Have a firm chair that has side arms. You can use this for support while you get dressed.  Do not have throw rugs and other things on the floor that can make you trip. What can I do in the kitchen?  Clean up any spills right away.  Avoid walking on wet floors.  Keep items that you use a lot in easy-to-reach places.  If you need to reach something above you, use a strong step stool that has a grab bar.  Keep electrical cords out of the way.  Do not use floor  polish or wax that makes floors slippery. If you must use wax, use non-skid floor wax.  Do not have throw rugs and other things on the floor that can make you trip. What can I do with my stairs?  Do not leave any items on the stairs.  Make sure that there are handrails on both sides of the stairs and use them. Fix handrails that are broken or loose. Make sure that handrails are as long as the stairways.  Check any carpeting to make sure that it is firmly attached to the stairs. Fix any carpet that is loose or worn.  Avoid having throw rugs at the top or bottom of the stairs. If you do have throw rugs, attach them to the floor with carpet tape.  Make sure that you have a light switch at the top of the stairs and the bottom of the stairs. If you do not have them, ask someone to add them for you. What else can I do to help prevent falls?  Wear shoes that:  Do not have high heels.  Have rubber bottoms.  Are comfortable and fit you well.  Are closed at the toe. Do not wear sandals.  If you use a stepladder:  Make sure that it is fully opened. Do not climb a closed stepladder.  Make sure that both sides of the stepladder are locked into place.  Ask someone to hold it for you, if possible.  Clearly mark and make sure that you can see:  Any grab bars or handrails.  First and last steps.  Where the edge of each step is.  Use tools that help you move around (mobility aids) if they are needed. These include:  Canes.  Walkers.  Scooters.  Crutches.  Turn on the lights when you go into a dark area. Replace any light bulbs as soon as they burn out.  Set up your furniture so you have a clear path. Avoid moving your furniture around.  If any of your floors are uneven, fix them.  If there are any pets around you, be aware of where they are.  Review your medicines with your doctor. Some medicines can make you feel dizzy. This can increase your chance of falling. Ask your  doctor what other things that you can do to help prevent falls. This information is not intended to replace advice given to you by your health care provider. Make sure you discuss any questions you have with your health care provider. Document Released: 07/17/2009 Document Revised: 02/26/2016 Document Reviewed: 10/25/2014 Elsevier Interactive Patient Education  2017  [employer].

## 2019-11-20 NOTE — Progress Notes (Signed)
Subjective:   Adam [name] is a 69 y.o. male who presents for Medicare Annual/Subsequent preventive examination.  Adam [name] has experienced multiple setback with his health in the last few months resulting in hospitalizations. He expresses difficulty managing physical pain and physical limitations. He states he is unable to exercise due to pain associated with rheumatoid arthritis and is very afraid of falling. He uses a walker at home to ambulate and states that he walks "like a tortoise". He is resting well at night and gets up twice to urinate. He states his appetite is decreased from his normal but still feels it is adequate. He is currently taking only tylenol for pain and reports that's ineffective.   Review of Systems:   Cardiac Risk Factors include: advanced age (>48men, >67 women);male gender;diabetes mellitus;sedentary lifestyle;dyslipidemia;hypertension     Objective:    Vitals: BP 138/84 (BP Location: Left Arm, Patient Position: Sitting)   Temp (!) 97.2 F (36.2 C) (Temporal)   Ht 5\' 10"  (1.778 m)   Wt 255 lb 12.8 oz (116 kg)   BMI 36.70 kg/m   Body mass index is 36.7 kg/m.  Advanced Directives 10/24/2019 09/12/2018 09/07/2018 06/26/2018 06/08/2018 03/31/2018 03/30/2018  Does Patient Have a Medical Advance Directive? No Yes Yes Yes Yes Yes Yes  Type of Advance Directive - [place];Living will [place];Living will - Living will [place];Living will [place];Living will  Does patient want to make changes to medical advance directive? - No - Patient declined No - Patient declined - No - Patient declined No - Patient declined -  Copy of [place] in Chart? - - No - copy requested - - No - copy requested No - copy requested  Would patient like information on creating a medical advance directive? No - Patient declined - - - - - -    Tobacco Social History   Tobacco Use  Smoking  Status Former Smoker  . Packs/day: 1.00  . Years: 7.00  . Pack years: 7.00  . Types: Cigarettes  . Quit date: 10/04/1977  . Years since quitting: 42.1  Smokeless Tobacco Never Used  Tobacco Comment   never smoked over 1 pack      Counseling given: Not Answered Comment: never smoked over 1 pack    Clinical Intake:  Pre-visit preparation completed: Yes  Pain : 0-10 Pain Score: 8  Pain Type: Other (Comment)(rheumatoid arthritis per patient) Pain Location: (diffuse joints)     BMI - recorded: 36.7 Nutritional Status: BMI > 30  Obese Nutritional Risks: Other (Comment) Diabetes: Yes CBG done?: No(patient does not check at home) Did pt. bring in CBG monitor from home?: No  How often do you need to have someone help you when you read instructions, pamphlets, or other written materials from your doctor or pharmacy?: 1 - Never  Interpreter Needed?: No  Information entered by :: Franne Forts, LPN.  Past Medical History:  Diagnosis Date  . Allergy   . Anxiety    history of PTSD following CABG  . Ascending aortic aneurysm ([facility]) 01/31/2018   43 x 42 mm, pt unaware  . Asthma   . Cardiomegaly 10/17/2017  . Colitis- colonoscopy 2014 07/13/2015  . COPD (chronic obstructive pulmonary disease) ([place])   . Coronary artery disease    x 6  . Depression   . Diabetes mellitus without complication ([place])   . Family history of polyps in the colon   .  Finger dislocation    Left pinkie  . GERD (gastroesophageal reflux disease)   . Gout   . H/O atrial fibrillation without current medication    following CABG with no documented episodes since then.  . Heart palpitations   . Hx of adenomatous colonic polyps 08/12/2010  . Hyperlipidemia   . Hypertension   . OA (osteoarthritis)   . OSA (obstructive sleep apnea)    Mild, has not received CPAP yet  . Prediabetes   . RLS (restless legs syndrome)   . 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 ANKLE FRACTURE SURGERY Right 1991  . APPENDECTOMY    . ATRIAL FIBRILLATION ABLATION N/A 03/31/2018   Procedure: ATRIAL FIBRILLATION ABLATION;  Surgeon: [name], MD;  Location: [facility];  Service: Cardiovascular;  Laterality: N/A;  . CARDIAC ELECTROPHYSIOLOGY MAPPING AND ABLATION    . COLONOSCOPY W/ BIOPSIES  2017   x7  . CORONARY ANGIOPLASTY WITH STENT PLACEMENT    . CORONARY ARTERY BYPASS GRAFT    . FINGER SURGERY  04/2018   Small finger left hand  . implantable loop recorder placement  07/02/2019   Medtronic Reveal [place] model [id number] ([id number] [id number]) implanted in office by Dr Rayann Heman  . TEE WITHOUT CARDIOVERSION N/A 03/30/2018   Procedure: TRANSESOPHAGEAL ECHOCARDIOGRAM (TEE);  Surgeon: [name], MD;  Location: [facility];  Service: Cardiovascular;  Laterality: N/A;  . TOTAL HIP ARTHROPLASTY Left   . TOTAL HIP ARTHROPLASTY Right 09/12/2018   Procedure: TOTAL HIP ARTHROPLASTY ANTERIOR APPROACH;  Surgeon: [name], MD;  Location: [facility];  Service: Orthopedics;  Laterality: Right;  70 mins   Family History  Problem Relation Age of Onset  . Colon cancer Mother   . Cancer Father        UNKNOWN TYPE  . Heart disease Paternal Grandmother   . Heart disease Paternal Grandfather    Social History   Socioeconomic History  . Marital status: Married    Spouse name: Not on file  . Number of children: 0  . Years of education: Not on file  . Highest education level: Not on file  Occupational History  . Occupation: retired  Tobacco Use  . Smoking status: Former Smoker    Packs/day: 1.00    Years: 7.00    Pack years: 7.00    Types: Cigarettes    Quit date: 10/04/1977    Years since quitting: 42.1  . Smokeless tobacco: Never Used  . Tobacco comment: never smoked over 1 pack   Substance and Sexual Activity  . Alcohol use: Yes    Alcohol/week: 3.0 - 4.0 standard drinks    Types: 1 Glasses of wine, 2 - 3 Shots of liquor per week    Comment:  DAILY  . Drug use: No    Comment: Smoked Marijuana back in 1970s  . Sexual activity: Yes  Other Topics Concern  . 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 Difficulty of Paying Living Expenses: Not very hard  Food Insecurity: No Food Insecurity  . Worried About Charity fundraiser in the Last Year: Never  true  . Ran Out of Food in the Last Year: Never true  Transportation Needs: No Transportation Needs  . Lack of Transportation (Medical): No  . Lack of Transportation (Non-Medical): No  Physical Activity: Inactive  . Days of Exercise per Week: 0 days  . Minutes of Exercise per Session: 0 min  Stress: Stress Concern Present  . Feeling of Stress : Very much  Social Connections: Somewhat Isolated  . Frequency of Communication with Friends and Family: More than three times a week  . Frequency of Social Gatherings with Friends and Family: Once a week  . Attends Religious Services: Never  . Active Member of Clubs or Organizations: No  . Attends Archivist Meetings: Never  . Marital Status: Married    Outpatient Encounter Medications as of 11/20/2019  Medication Sig  . allopurinol (ZYLOPRIM) 300 MG tablet Take 1 tablet (300 mg total) by mouth daily.  . Coenzyme Q10 (CO Q-10) 100 MG CAPS Take 100 mg by mouth daily.   . diphenhydrAMINE HCl, Sleep, (ZZZQUIL) 25 MG CAPS Take 25 mg by mouth at bedtime as needed (for sleep).  . dofetilide (TIKOSYN) 125 MCG capsule Take 1 capsule (125 mcg total) by mouth 2 (two) times daily. May have 90 day refills if patient prefers  . fenofibrate micronized (LOFIBRA) 200 MG capsule Take 1 capsule (200 mg total) by mouth daily before breakfast.  . fluticasone (FLONASE) 50  MCG/ACT nasal spray Place 1 spray into both nostrils daily.   . Fluticasone-Umeclidin-Vilant (TRELEGY ELLIPTA) 100-62.5-25 MCG/INH AEPB Inhale 1 puff into the lungs daily.  Marland Kitchen gabapentin (NEURONTIN) 300 MG capsule Take two to three tablets at night as needed for restless leg symptoms (Patient taking differently: Take 600-900 mg by mouth at bedtime as needed (for restless leg symptoms). )  . glucose blood (ONETOUCH VERIO) test strip Test once daily.  Dx E11.9  . hydrocortisone (ANUSOL-HC) 2.5 % rectal cream Use nightly at bedtime for 10 days  . Icosapent Ethyl (VASCEPA) 1 g CAPS Take 2 capsules by mouth 2 (two) times daily.   Marland Kitchen JARDIANCE 25 MG TABS tablet TAKE 1 TABLET BY MOUTH DAILY (Patient taking differently: Take 25 mg by mouth daily. )  . LORazepam (ATIVAN) 0.5 MG tablet Take 1 tablet (0.5 mg total) by mouth every 8 (eight) hours as needed for anxiety.  Marland Kitchen losartan (COZAAR) 100 MG tablet Take 1 tablet (100 mg total) by mouth daily.  . metFORMIN (GLUCOPHAGE) 1000 MG tablet TAKE 1 TABLET BY MOUTH TWICE DAILY WITH A MEAL (Patient taking differently: Take 1,000 mg by mouth 2 (two) times daily with a meal. )  . metoprolol tartrate (LOPRESSOR) 100 MG tablet TAKE 1 TABLET(100 MG) BY MOUTH TWICE DAILY (Patient taking differently: Take 50 mg by mouth 2 (two) times daily. )  . montelukast (SINGULAIR) 10 MG tablet TAKE 1 TABLET BY MOUTH AT BEDTIME (Patient taking differently: Take 10 mg by mouth at bedtime. )  . potassium chloride SA (KLOR-CON) 20 MEQ tablet Take 2 tablets (40 mEq total) by mouth 2 (two) times daily.  . RABEprazole (ACIPHEX) 20 MG tablet Take 1 tablet (20 mg total) by mouth daily.  . rosuvastatin (CRESTOR) 40 MG tablet Take 1 tablet (40 mg total) by mouth daily.  . sitaGLIPtin (JANUVIA) 100 MG tablet Take 1 tablet (100 mg total) by mouth daily.  Nelva Nay SOLOSTAR 300 UNIT/ML SOPN INJECT 60 UNITS AS DIRECTED DAILY (Patient taking differently: Take 60 Units by mouth daily at 12 noon. )  No  facility-administered encounter medications on file as of 11/20/2019.    Activities of Daily Living In your present state of health, do you have any difficulty performing the following activities: 11/20/2019 10/25/2019  Hearing? N N  Vision? N N  Difficulty concentrating or making decisions? N N  Walking or climbing stairs? Y Y  Dressing or bathing? N N  Doing errands, shopping? N N  Preparing Food and eating ? N -  Using the Toilet? N -  In the past six months, have you accidently leaked urine? N -  Do you have problems with loss of bowel control? N -  Managing your Medications? N -  Managing your Finances? N -  Housekeeping or managing your Housekeeping? N -  Some recent data might be hidden    Patient Care Team: [name], MD as PCP - General (Family Medicine) [name], MD as PCP - Electrophysiology (Cardiology) Sueanne Margarita, MD as PCP - Sleep Medicine (Sleep Medicine) [name], MD as PCP - Cardiology (Cardiology)   Assessment:   This is a routine wellness examination for Landy.  Exercise Activities and Dietary recommendations Current Exercise Habits: The patient does not participate in regular exercise at present, Exercise limited by: orthopedic condition(s)  Goals    . decrease pain     Consider pain medication and pool exercise    . mental health outlet     Consider counseling or therapy as an outlet to express feelings about declining health and aging    . Prevent falls       Fall Risk Fall Risk  11/20/2019 06/26/2018 06/26/2018 06/23/2018 06/08/2017  Falls in the past year? 1 - Yes Yes No  Number falls in past yr: 1 - 1 1 -  Injury with Fall? 0 Yes Yes Yes -  Risk for fall due to : Medication side effect;Orthopedic patient;Impaired balance/gait;History of fall(s) - - - -  Follow up Falls evaluation completed;Education provided;Falls prevention discussed Education provided Education provided - -   Is the patient's home free of loose throw  rugs in walkways, pet beds, electrical cords, etc?   yes      Grab bars in the bathroom? yes      Handrails on the stairs?   yes      Adequate lighting?   yes  Timed Get Up and Go Performed: delayed  Depression Screen PHQ 2/9 Scores 11/20/2019 06/26/2018 06/23/2018 06/08/2017  PHQ - 2 Score 4 0 0 0  PHQ- 9 Score 7 - - -    Cognitive Function MMSE - Mini Mental State Exam 06/26/2018 06/08/2017  Not completed: (No Data) (No Data)     6CIT Screen 11/20/2019  What Year? 0 points  What month? 0 points  What time? 0 points  Count back from 20 0 points  Months in reverse 0 points  Repeat phrase 0 points  Total Score 0    Immunization History  Administered Date(s) Administered  . Fluad Quad(high Dose 65+) 06/25/2019  . Influenza Split 07/04/2012  . Influenza, High Dose Seasonal PF 05/28/2016, 06/08/2017, 06/23/2018  . Influenza,inj,Quad PF,6+ Mos 07/05/2013, 05/22/2014, 06/17/2015  . Pneumococcal Conjugate-13 12/01/2015  . Pneumococcal Polysaccharide-23 10/05/2006, 11/30/2016  . Td 10/06/2009  . Zoster 08/21/2013  . Zoster Recombinat (Shingrix) 12/02/2016, 04/02/2017    Qualifies for Shingles Vaccine? Patient has completed shingrix series.  Screening Tests Health Maintenance  Topic Date Due  . COLONOSCOPY  11/19/2018  . TETANUS/TDAP  10/07/2019  . HEMOGLOBIN A1C  03/24/2020  . OPHTHALMOLOGY EXAM  08/08/2020  . FOOT EXAM  09/23/2020  . INFLUENZA VACCINE  Completed  . Hepatitis C Screening  Completed  . PNA vac Low Risk Adult  Completed   Cancer Screenings: Lung: Low Dose CT Chest recommended if Age 62-80 years, 30 pack-year currently smoking OR have quit w/in 15years. Patient does not qualify. Colorectal: up to date.   Additional Screenings:  Hepatitis C Screening:completed 06/15/2017.      Plan:    Adam [name] needs an updated Tdap but declines today due to cost and not being covered by insurance. Asked patient to consider physical therapy to help with gait, balance,  and strengthening. Also to consider counseling services, pain medication, and pool exercise to help with joint pain. He stated he will think about this and discuss with Dr. Elease Hashimoto.   I have personally reviewed and noted the following in the patient's chart:   . Medical and social history . Use of alcohol, tobacco or illicit drugs  . Current medications and supplements . Functional ability and status . Nutritional status . Physical activity . Advanced directives . List of other physicians . Hospitalizations, surgeries, and ER visits in previous 12 months . Vitals . Screenings to include cognitive, depression, and falls . Referrals and appointments  In addition, I have reviewed and discussed with patient certain preventive protocols, quality metrics, and best practice recommendations. A written personalized care plan for preventive services as well as general preventive health recommendations were provided to patient.     Franne Forts, LPN  [id number]

## 2019-11-22 ENCOUNTER — Encounter: Payer: Medicare Other | Admitting: Student

## 2019-11-22 ENCOUNTER — Other Ambulatory Visit: Payer: Self-pay | Admitting: Family Medicine

## 2019-11-29 ENCOUNTER — Other Ambulatory Visit: Payer: Self-pay | Admitting: Cardiology

## 2019-11-29 NOTE — Progress Notes (Signed)
Cardiology Office Note Date:  11/30/2019  Patient ID:  Adam [name], Adam [name] 01-02-1951, MRN [id number] PCP:  [name], MD  Cardiologist:  Dr. Wynonia Lawman >> Dr. [name]: Dr. Rayann Heman    Chief Complaint: Tikosyn visit  History of Present Illness: Adam [name] is a 69 y.o. male with history of HTN, HLD, CAD (CABG 2011 with rports of numerous prior stents), COPD, OSA (intolerant of CPAP), DM, obesity, and persistent AFib), ascending Ao aneurysm, + ETOH (3-4 drinks daily).  He had been doing well, saw  Dr. Rayann Heman via tele-health visit 10/01/2019.   Noted 10 day gap in his Tikosyn back in October it seems 2/2 pharmacy issue though was back on it.  He also noted noncompliance with recommendations to pursue oral appliance for his OSA, and intolerant of CPAP.  Having difficulty with lifestyle recommendations with back pain.  ALl in all, though from Afib perspective doing well, AF burden 0.2% Planned to have Tikosyn surveillance via Afib clinic Q 3 mo.  He was admitted to [facility] 10/24/19 with LGIB EP was asked to the case to aid in Tikosyn re-initiation, given the pt had been in HCTZ and had missed 2 days of Tikosyn prior to his admission 2/2 not feeling well. He was in SR, was resumed on his home dose of Tikosyn 172mcg BID His QT was stable  Planned to hold his Eliquis and monitor AFib burden to guide decision to restart a/c or not  He saw C. [name], PA in the Afib clinic 11/02/19, no symptoms of Afib, no recurrent bleeding, QTc stable, labs stable  He is doing well.  Had some intermittent small amount of blood with BM for a couple days post hospital stay, though none in weeks.   He denies any symptoms of Afib, no CP, palpitations.  He has baseline DOE with his COPD, unchanged.   No dizzy spells, no near syncope or syncope.     AFib Hx PVI ablation 03/31/2018, Dr.Allred Initial diagnosis appears post-op 2011 after his CABG though  Had long hiatus without any until  2019) AAD Tikosyn started 12/13/2017, is current Pt has stated he was on amiodarone back in 2011 briefly, turned his skin purple  MDT ILR implanted 07/02/19 for AFib surveillence  Past Medical History:  Diagnosis Date  . Allergy   . Anxiety    history of PTSD following CABG  . Ascending aortic aneurysm ([place]) 01/31/2018   43 x 42 mm, pt unaware  . Asthma   . Cardiomegaly 10/17/2017  . Colitis- colonoscopy 2014 07/13/2015  . COPD (chronic obstructive pulmonary disease) ([place])   . Coronary artery disease    x 6  . Depression   . Diabetes mellitus without complication ([place])   . Family history of polyps in the colon   . Finger dislocation    Left pinkie  . GERD (gastroesophageal reflux disease)   . Gout   . H/O atrial fibrillation without current medication    following CABG with no documented episodes since then.  . Heart palpitations   . Hx of adenomatous colonic polyps 08/12/2010  . Hyperlipidemia   . Hypertension   . OA (osteoarthritis)   . OSA (obstructive sleep apnea)    Mild, has not received CPAP yet  . Prediabetes   . RLS (restless legs syndrome)   . 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 ANKLE FRACTURE SURGERY Right 1991  .  APPENDECTOMY    . ATRIAL FIBRILLATION ABLATION N/A 03/31/2018   Procedure: ATRIAL FIBRILLATION ABLATION;  Surgeon: [name], MD;  Location: [place] [facility];  Service: Cardiovascular;  Laterality: N/A;  . CARDIAC ELECTROPHYSIOLOGY MAPPING AND ABLATION    . COLONOSCOPY W/ BIOPSIES  2017   x7  . CORONARY ANGIOPLASTY WITH STENT PLACEMENT    . CORONARY ARTERY BYPASS GRAFT    . FINGER SURGERY  04/2018   Small finger left hand  . implantable loop recorder placement  07/02/2019   Medtronic Reveal [place] model [id number] ([id number] [id number]) implanted in office by Dr Rayann Heman  . TEE WITHOUT CARDIOVERSION N/A 03/30/2018   Procedure: TRANSESOPHAGEAL ECHOCARDIOGRAM (TEE);  Surgeon: [name], MD;  Location: [place];  Service: Cardiovascular;  Laterality: N/A;  . TOTAL HIP ARTHROPLASTY Left   . TOTAL HIP ARTHROPLASTY Right 09/12/2018   Procedure: TOTAL HIP ARTHROPLASTY ANTERIOR APPROACH;  Surgeon: [name], MD;  Location: [facility];  Service: Orthopedics;  Laterality: Right;  70 mins    Current Outpatient Medications  Medication Sig Dispense Refill  . allopurinol (ZYLOPRIM) 300 MG tablet Take 1 tablet (300 mg total) by mouth daily. 90 tablet 3  . Coenzyme Q10 (CO Q-10) 100 MG CAPS Take 100 mg by mouth daily.     Marland Kitchen dofetilide (TIKOSYN) 125 MCG capsule Take 1 capsule (125 mcg total) by mouth 2 (two) times daily. May have 90 day refills if patient prefers 60 capsule 11  . fenofibrate micronized (LOFIBRA) 200 MG capsule Take 1 capsule (200 mg total) by mouth daily before breakfast. 90 capsule 3  . fluticasone (FLONASE) 50 MCG/ACT nasal spray Place 1 spray into both nostrils daily.     . Fluticasone-Umeclidin-Vilant (TRELEGY ELLIPTA) 100-62.5-25 MCG/INH AEPB Inhale 1 puff into the lungs daily. 60 each 3  . gabapentin (NEURONTIN) 300 MG capsule Take two to three tablets at night as needed for restless leg symptoms (Patient taking differently: Take 600-900 mg by mouth at bedtime as needed (for restless leg symptoms). ) 270 capsule 3  . glucose blood (ONETOUCH VERIO) test strip Test once daily.  Dx E11.9 100 each 3  . hydrocortisone (ANUSOL-HC) 2.5 % rectal cream Use nightly at bedtime for 10 days 30 g 1  . JANUVIA 100 MG tablet TAKE 1 TABLET(100 MG) BY MOUTH DAILY 90 tablet 1  . JARDIANCE 25 MG TABS tablet TAKE 1 TABLET BY MOUTH DAILY (Patient taking differently: Take 25 mg by mouth daily. ) 90 tablet 0  . LORazepam (ATIVAN) 0.5 MG tablet Take 1 tablet (0.5 mg total) by mouth every 8 (eight) hours as needed for anxiety. 90 tablet 5  . metFORMIN (GLUCOPHAGE) 1000 MG tablet TAKE 1 TABLET BY MOUTH TWICE DAILY WITH A MEAL (Patient taking differently: Take 1,000 mg by mouth 2 (two)  times daily with a meal. ) 180 tablet 1  . methocarbamol (ROBAXIN) 500 MG tablet Take 500 mg by mouth 3 (three) times daily as needed.    . metoprolol tartrate (LOPRESSOR) 100 MG tablet TAKE 1 TABLET(100 MG) BY MOUTH TWICE DAILY (Patient taking differently: Take 50 mg by mouth 2 (two) times daily. ) 180 tablet 1  . montelukast (SINGULAIR) 10 MG tablet TAKE 1 TABLET BY MOUTH AT BEDTIME (Patient taking differently: Take 10 mg by mouth at bedtime. ) 90 tablet 1  . RABEprazole (ACIPHEX) 20 MG tablet Take 1 tablet (20 mg total) by mouth daily. 90 tablet 1  . rosuvastatin (CRESTOR) 40 MG tablet Take 1 tablet (40 mg  total) by mouth daily. 90 tablet 3  . TOUJEO SOLOSTAR 300 UNIT/ML SOPN INJECT 60 UNITS AS DIRECTED DAILY (Patient taking differently: Take 60 Units by mouth daily at 12 noon. ) 33 mL 1  . VASCEPA 1 g capsule TAKE 2 CAPSULES(2 GRAMS) BY MOUTH TWICE DAILY 180 capsule 1  . losartan (COZAAR) 100 MG tablet Take 1 tablet (100 mg total) by mouth daily. 90 tablet 0  . potassium chloride SA (KLOR-CON) 20 MEQ tablet Take 2 tablets (40 mEq total) by mouth 2 (two) times daily. 120 tablet 0   No current facility-administered medications for this visit.    Allergies:   Amoxicillin, Augmentin [amoxicillin-pot clavulanate], Azithromycin, Clindamycin/lincomycin, and Keflex [cephalexin]   Social History:  The patient  reports that he quit smoking about 42 years ago. His smoking use included cigarettes. He has a 7.00 pack-year smoking history. He has never used smokeless tobacco. He reports current alcohol use of about 3.0 - 4.0 standard drinks of alcohol per week. He reports that he does not use drugs.   Family History:  The patient's family history includes Cancer in his father; Colon cancer in his mother; Heart disease in his paternal grandfather and paternal grandmother.  ROS:  Please see the history of present illness.  All other systems are reviewed and otherwise negative.   PHYSICAL EXAM:  VS:  BP  124/68   Pulse 76   Ht 5\' 10"  (1.778 m)   Wt 253 lb (114.8 kg)   SpO2 97%   BMI 36.30 kg/m  BMI: Body mass index is 36.3 kg/m. Well nourished, well developed, in no acute distress  HEENT: normocephalic, atraumatic  Neck: no JVD, carotid bruits or masses Cardiac:  RRR; no significant murmurs, no rubs, or gallops Lungs:  CTA b/l, no wheezing, rhonchi or rales  Abd: soft, nontender, obese MS: no deformity or atrophy [id number] edema  Skin: warm and dry, no rash Neuro:  No gross deficits appreciated Psych: euthymic mood, full affect  ILR site is stable, no tethering or discomfort   EKG:  Done today and reviewed by myself  shows  SR 76bpm, manually measured At 400-419ms/QTc 450-487ms  ILR interrogation done today and reviewed by myself:  Battery is good, R wave 0.20mV SR today He has had some Afib episodes over the past year Available episodes this evaluation some appear true Afib others/most look like SR with PACs Burden 0.9%   10/31/2017 EPS/Ablation CONCLUSIONS: 1. Atrial fibrillation upon presentation.  2. Intracardiac echo reveals a moderate sized left atrium with four separate pulmonary veins without evidence of pulmonary vein stenosis. 3. Successful electrical isolation and anatomical encircling of all four pulmonary veins with radiofrequency current. A WACA approach was used 3. Additional left atrial ablation was performed with a standard box lesion created along the posterior wall of the left atrium 4. Atrial fibrillation successfully cardioverted to sinus rhythm. 5. No early apparent complications.   10/30/2017: TEE Study Conclusions - Left ventricle: The cavity size was normal. Wall thickness was normal. Systolic function was normal. The estimated ejection fraction was in the range of 55% to 60%. Wall motion was normal; there were no regional wall motion abnormalities. - Mitral valve: Mildly calcified annulus. - Left atrium: No evidence of thrombus in  the atrial cavity or appendage. No spontaneous echo contrast was observed. The appendage was morphologically a left appendage, multilobulated, and of normal size. Emptying velocity was mildly reduced. - Right atrium: No evidence of thrombus in the atrial cavity or appendage. -  Atrial septum: There was a very small patent foramen ovale. Doppler showed a trivial left-to-right shunt, in the baseline state.     Recent Labs: 12/29/2018: TSH 1.32 10/24/2019: ALT 31 11/02/2019: BUN 10; Creatinine, Ser 0.94; Hemoglobin 12.0; Magnesium 2.1; Platelets 227; Potassium 4.4; Sodium 137  08/08/2019: Chol/HDL Ratio 3.8; Cholesterol, Total 142; HDL 37; LDL Chol Calc (NIH) 65; Triglycerides 247   CrCl cannot be calculated (Patient's most recent lab result is older than the maximum 21 days allowed.).   Wt Readings from Last 3 Encounters:  11/30/19 253 lb (114.8 kg)  11/20/19 255 lb 12.8 oz (116 kg)  11/08/19 252 lb (114.3 kg)     Other studies reviewed: Additional studies/records reviewed today include: summarized above  ASSESSMENT AND PLAN:  1. Persistent AFib     CHA2DS2Vasc is 3, off a/c after LGIB     S/p PVI ablation June 2019      QTc stable on Tikosyn      0.9% burden by his ILR, though some are false episodes     BMEt and mag today  Mentioned prior discussion with Dr. Rayann Heman regarding his a/c, would be to monitor his Afib burden to decide when/if to resume.  He tells me that regardless, he would never go back on a blood thinner Discussed stroke/embolic risks if he should have increased AFib, he understands, states he does not care and will take his chances.   Med list is reviewed with RPH here today, will have him stop the ZzzQuil (benadryl), he mentions never really uses it anyway.  Discussed not to use benadryl containing products and always check with pharmacist before taking any new medicines.  2. CAD     No anginal symptoms     follows with Dr. Martinique     On BB,  statin  If no plans for a/c, would consider low dose ASA when cleared GI-wise  3. HTN     No changes    Disposition: F/u with monthly ILR transmissions, in clinic in 75mo, sooner if needed  Current medicines are reviewed at length with the patient today.  The patient did not have any concerns regarding medicines.  Venetia Night, PA-C 11/30/2019 6:16 PM     [place] [address] [place] [address] [phone] (office)  [phone] (fax)

## 2019-11-29 NOTE — Telephone Encounter (Signed)
Looks like this was most recently sent in by PCP.  Dr. Martinique pt.  Will route back to NL.

## 2019-11-29 NOTE — Telephone Encounter (Signed)
Rx has been sent to the pharmacy electronically. ° °

## 2019-11-30 ENCOUNTER — Other Ambulatory Visit: Payer: Self-pay

## 2019-11-30 ENCOUNTER — Ambulatory Visit: Payer: Medicare Other | Admitting: Physician Assistant

## 2019-11-30 VITALS — BP 124/68 | HR 76 | Ht 70.0 in | Wt 253.0 lb

## 2019-11-30 DIAGNOSIS — I4819 Other persistent atrial fibrillation: Secondary | ICD-10-CM | POA: Diagnosis not present

## 2019-11-30 DIAGNOSIS — I480 Paroxysmal atrial fibrillation: Secondary | ICD-10-CM

## 2019-11-30 DIAGNOSIS — Z4509 Encounter for adjustment and management of other cardiac device: Secondary | ICD-10-CM | POA: Diagnosis not present

## 2019-11-30 DIAGNOSIS — Z79899 Other long term (current) drug therapy: Secondary | ICD-10-CM

## 2019-11-30 NOTE — Patient Instructions (Signed)
Medication Instructions:   STOP TAKING: ZZ QUIL AND BENADRYL   *If you need a refill on your cardiac medications before your next appointment, please call your pharmacy*   Lab Work: [facility] AND [place]   If you have labs (blood work) drawn today and your tests are completely normal, you will receive your results only by: Marland Kitchen MyChart Message (if you have MyChart) OR . A paper copy in the mail If you have any lab test that is abnormal or we need to change your treatment, we will call you to review the results.   Testing/Procedures: NONE ORDERED  TODAY    Follow-Up: At [facility], you and your health needs are our priority.  As part of our continuing mission to provide you with exceptional heart care, we have created designated Provider Care Teams.  These Care Teams include your primary Cardiologist (physician) and Advanced Practice Providers (APPs -  Physician Assistants and Nurse Practitioners) who all work together to provide you with the care you need, when you need it.  We recommend signing up for the patient portal called "MyChart".  Sign up information is provided on this After Visit Summary.  MyChart is used to connect with patients for Virtual Visits (Telemedicine).  Patients are able to view lab/test results, encounter notes, upcoming appointments, etc.  Non-urgent messages can be sent to your provider as well.   To learn more about what you can do with MyChart, go to [email or web address].    Your next appointment:   6 month(s)  The format for your next appointment:   In Person  Provider:   You may see [name], MD or one of the following Advanced Practice Providers on your designated Care Team:    Chanetta Marshall, NP  Tommye Standard, PA-C  Legrand Como "Oda Kilts, [place]    Other Instructions

## 2019-12-03 ENCOUNTER — Other Ambulatory Visit: Payer: Self-pay

## 2019-12-03 ENCOUNTER — Encounter: Payer: Self-pay | Admitting: Adult Health

## 2019-12-03 ENCOUNTER — Ambulatory Visit (INDEPENDENT_AMBULATORY_CARE_PROVIDER_SITE_OTHER): Payer: Medicare Other

## 2019-12-03 ENCOUNTER — Ambulatory Visit: Payer: Medicare Other | Admitting: Adult Health

## 2019-12-03 VITALS — BP 148/74 | HR 89 | Temp 97.0°F | Ht 70.0 in | Wt 250.6 lb

## 2019-12-03 DIAGNOSIS — J441 Chronic obstructive pulmonary disease with (acute) exacerbation: Secondary | ICD-10-CM | POA: Diagnosis not present

## 2019-12-03 DIAGNOSIS — J209 Acute bronchitis, unspecified: Secondary | ICD-10-CM

## 2019-12-03 DIAGNOSIS — J440 Chronic obstructive pulmonary disease with acute lower respiratory infection: Secondary | ICD-10-CM

## 2019-12-03 LAB — COMPREHENSIVE METABOLIC PANEL WITH GFR
ALT: 18 U/L (ref 0–53)
AST: 33 U/L (ref 0–37)
Albumin: 4.1 g/dL (ref 3.5–5.2)
Alkaline Phosphatase: 37 U/L — ABNORMAL LOW (ref 39–117)
BUN: 6 mg/dL (ref 6–23)
CO2: 25 meq/L (ref 19–32)
Calcium: 10 mg/dL (ref 8.4–10.5)
Chloride: 105 meq/L (ref 96–112)
Creatinine, Ser: 0.98 mg/dL (ref 0.40–1.50)
GFR: 75.74 mL/min
Glucose, Bld: 106 mg/dL — ABNORMAL HIGH (ref 70–99)
Potassium: 4.1 meq/L (ref 3.5–5.1)
Sodium: 141 meq/L (ref 135–145)
Total Bilirubin: 0.9 mg/dL (ref 0.2–1.2)
Total Protein: 7.6 g/dL (ref 6.0–8.3)

## 2019-12-03 LAB — CBC WITH DIFFERENTIAL/PLATELET
Basophils Absolute: 0 10*3/uL (ref 0.0–0.1)
Basophils Relative: 0.7 % (ref 0.0–3.0)
Eosinophils Absolute: 0.1 10*3/uL (ref 0.0–0.7)
Eosinophils Relative: 1 % (ref 0.0–5.0)
HCT: 34.9 % — ABNORMAL LOW (ref 39.0–52.0)
Hemoglobin: 11.6 g/dL — ABNORMAL LOW (ref 13.0–17.0)
Lymphocytes Relative: 21.2 % (ref 12.0–46.0)
Lymphs Abs: 1.2 10*3/uL (ref 0.7–4.0)
MCHC: 33.2 g/dL (ref 30.0–36.0)
MCV: 95.4 fl (ref 78.0–100.0)
Monocytes Absolute: 0.9 10*3/uL (ref 0.1–1.0)
Monocytes Relative: 16.8 % — ABNORMAL HIGH (ref 3.0–12.0)
Neutro Abs: 3.3 10*3/uL (ref 1.4–7.7)
Neutrophils Relative %: 60.3 % (ref 43.0–77.0)
Platelets: 182 10*3/uL (ref 150.0–400.0)
RBC: 3.66 Mil/uL — ABNORMAL LOW (ref 4.22–5.81)
RDW: 15.2 % (ref 11.5–15.5)
WBC: 5.6 10*3/uL (ref 4.0–10.5)

## 2019-12-03 LAB — BRAIN NATRIURETIC PEPTIDE: Pro B Natriuretic peptide (BNP): 266 pg/mL — ABNORMAL HIGH (ref 0.0–100.0)

## 2019-12-03 MED ORDER — DOXYCYCLINE HYCLATE 100 MG PO TABS: 100.0000 mg | ORAL_TABLET | Freq: Two times a day (BID) | ORAL | 0 refills | Status: DC

## 2019-12-03 MED ORDER — TRELEGY ELLIPTA 100-62.5-25 MCG/INH IN AEPB: 1.0000 | INHALATION_SPRAY | Freq: Every day | RESPIRATORY_TRACT | 3 refills | Status: DC

## 2019-12-03 NOTE — Patient Instructions (Addendum)
Restart TRELEGY 1 puff daily , rinse after use.  Doxycycline 100mg  Twice daily  - to have on hold if symptoms worsen with discolored mucus  I will call with lab results  Mucinex DM Twice daily  As needed  Cough/congestion .  Follow up with Dr. Lamonte Sakai  In 6 weeks and As needed   Please contact office for sooner follow up if symptoms do not improve or worsen or seek emergency care

## 2019-12-03 NOTE — Progress Notes (Signed)
@Patient  ID: Adam [name], male    DOB: [birth year], 69 y.o.   MRN: [id number]  Chief Complaint  Patient presents with  . Follow-up    COPD     Referring provider: [name], MD  HPI: 69 year old male former smoker followed for COPD Medical history significant for coronary disease status post CABG and hypertension  TEST/EVENTS :   12/03/2019 Acute OV : COPD  Patient presents today for an acute office visit.  Patient complains over the last couple days that he has not felt as well.  He has had some increased shortness of breath minimum congestion and wheezing.  Patient says he woke up this morning had increased symptoms however these resolved after a couple of hours.  Patient has no fever no discolored mucus no loss of taste or smell no sore throat or nasal congestion.  Patient says he has not taken his inhalers for the last 4 weeks.  Patient says he was admitted in January with rectal bleeding and ETOH withdrawal.   Xarelto has been stopped.  And he says he has not restarted his Trelegy.  Patient denies any chest pain orthopnea PND or increased leg swelling. Chest x-ray today shows COPD changes without acute process. Continue to drink ETOH .   Allergies  Allergen Reactions  . Amoxicillin Rash and Other (See Comments)    * SEVERE RASH IN GROIN AREA Has patient had a PCN reaction causing immediate rash, facial/tongue/throat swelling, SOB or lightheadedness with hypotension: no Has patient had a PCN reaction causing severe rash involving mucus membranes or skin necrosis: no Has patient had a PCN reaction that required hospitalization: no Has patient had a PCN reaction occurring within the last 10 years: yes If all of the above answers are "NO", then may proceed with Cephalosporin use.   . Augmentin [Amoxicillin-Pot Clavulanate] Rash and Other (See Comments)    * SEVERE RASH IN GROIN AREA  . Azithromycin Rash and Other (See Comments)    * SEVERE RASH IN GROIN AREA  .  Clindamycin/Lincomycin Rash  . Keflex [Cephalexin] Rash    Immunization History  Administered Date(s) Administered  . Fluad Quad(high Dose 65+) 06/25/2019  . Influenza Split 07/04/2012  . Influenza, High Dose Seasonal PF 05/28/2016, 06/08/2017, 06/23/2018  . Influenza,inj,Quad PF,6+ Mos 07/05/2013, 05/22/2014, 06/17/2015  . Pneumococcal Conjugate-13 12/01/2015  . Pneumococcal Polysaccharide-23 10/05/2006, 11/30/2016  . Td 10/06/2009  . Zoster 08/21/2013  . Zoster Recombinat (Shingrix) 12/02/2016, 04/02/2017    Past Medical History:  Diagnosis Date  . Allergy   . Anxiety    history of PTSD following CABG  . Ascending aortic aneurysm ([place]) 01/31/2018   43 x 42 mm, pt unaware  . Asthma   . Cardiomegaly 10/17/2017  . Colitis- colonoscopy 2014 07/13/2015  . COPD (chronic obstructive pulmonary disease) ([place])   . Coronary artery disease    x 6  . Depression   . Diabetes mellitus without complication ([place])   . Family history of polyps in the colon   . Finger dislocation    Left pinkie  . GERD (gastroesophageal reflux disease)   . Gout   . H/O atrial fibrillation without current medication    following CABG with no documented episodes since then.  . Heart palpitations   . Hx of adenomatous colonic polyps 08/12/2010  . Hyperlipidemia   . Hypertension   . OA (osteoarthritis)   . OSA (obstructive sleep apnea)    Mild, has not received CPAP yet  . Prediabetes   .  RLS (restless legs syndrome)   . 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 Packs/day: 1.00  . Years: 7.00  . Pack years: 7.00  . Types: Cigarettes  . Quit date: 10/04/1977  . Years since quitting: 42.1  Smokeless Tobacco Never Used  Tobacco Comment   never smoked over 1 pack    Counseling given: Not Answered Comment: never smoked over 1 pack    Outpatient Medications Prior to Visit  Medication Sig Dispense Refill  .  allopurinol (ZYLOPRIM) 300 MG tablet Take 1 tablet (300 mg total) by mouth daily. 90 tablet 3  . Coenzyme Q10 (CO Q-10) 100 MG CAPS Take 100 mg by mouth daily.     Marland Kitchen dofetilide (TIKOSYN) 125 MCG capsule Take 1 capsule (125 mcg total) by mouth 2 (two) times daily. May have 90 day refills if patient prefers 60 capsule 11  . fenofibrate micronized (LOFIBRA) 200 MG capsule Take 1 capsule (200 mg total) by mouth daily before breakfast. 90 capsule 3  . fluticasone (FLONASE) 50 MCG/ACT nasal spray Place 1 spray into both nostrils daily.     Marland Kitchen gabapentin (NEURONTIN) 300 MG capsule Take two to three tablets at night as needed for restless leg symptoms (Patient taking differently: Take 600-900 mg by mouth at bedtime as needed (for restless leg symptoms). ) 270 capsule 3  . glucose blood (ONETOUCH VERIO) test strip Test once daily.  Dx E11.9 100 each 3  . hydrocortisone (ANUSOL-HC) 2.5 % rectal cream Use nightly at bedtime for 10 days 30 g 1  . JANUVIA 100 MG tablet TAKE 1 TABLET(100 MG) BY MOUTH DAILY 90 tablet 1  . JARDIANCE 25 MG TABS tablet TAKE 1 TABLET BY MOUTH DAILY (Patient taking differently: Take 25 mg by mouth daily. ) 90 tablet 0  . LORazepam (ATIVAN) 0.5 MG tablet Take 1 tablet (0.5 mg total) by mouth every 8 (eight) hours as needed for anxiety. 90 tablet 5  . losartan (COZAAR) 100 MG tablet Take 1 tablet (100 mg total) by mouth daily. 90 tablet 0  . metFORMIN (GLUCOPHAGE) 1000 MG tablet TAKE 1 TABLET BY MOUTH TWICE DAILY WITH A MEAL (Patient taking differently: Take 1,000 mg by mouth 2 (two) times daily with a meal. ) 180 tablet 1  . methocarbamol (ROBAXIN) 500 MG tablet Take 500 mg by mouth 3 (three) times daily as needed.    . metoprolol tartrate (LOPRESSOR) 100 MG tablet TAKE 1 TABLET(100 MG) BY MOUTH TWICE DAILY (Patient taking differently: Take 50 mg by mouth 2 (two) times daily. ) 180 tablet 1  . montelukast (SINGULAIR) 10 MG tablet TAKE 1 TABLET BY MOUTH AT BEDTIME (Patient taking  differently: Take 10 mg by mouth at bedtime. ) 90 tablet 1  . potassium chloride SA (KLOR-CON) 20 MEQ tablet Take 2 tablets (40 mEq total) by mouth 2 (two) times daily. 120 tablet 0  . RABEprazole (ACIPHEX) 20 MG tablet Take 1 tablet (20 mg total) by mouth daily. 90 tablet 1  . rosuvastatin (CRESTOR) 40 MG tablet Take 1 tablet (40 mg total) by mouth daily. 90 tablet 3  . TOUJEO SOLOSTAR 300 UNIT/ML SOPN INJECT 60 UNITS AS DIRECTED DAILY (Patient taking differently: Take 60 Units by mouth daily at 12 noon. ) 33 mL 1  . VASCEPA 1 g capsule TAKE 2 CAPSULES(2 GRAMS) BY MOUTH TWICE DAILY 180 capsule 1  . Fluticasone-Umeclidin-Vilant (TRELEGY  ELLIPTA) 100-62.5-25 MCG/INH AEPB Inhale 1 puff into the lungs daily. 60 each 3   No facility-administered medications prior to visit.     Review of Systems:   Constitutional:   No  weight loss, night sweats,  Fevers, chills, + fatigue, or  lassitude.  HEENT:   No headaches,  Difficulty swallowing,  Tooth/dental problems, or  Sore throat,                No sneezing, itching, ear ache, nasal congestion, post nasal drip,   CV:  No chest pain,  Orthopnea, PND, swelling in lower extremities, anasarca, dizziness, palpitations, syncope.   GI  No heartburn, indigestion, abdominal pain, nausea, vomiting, diarrhea, change in bowel habits, loss of appetite, bloody stools.   Resp:   No chest wall deformity  Skin: no rash or lesions.  GU: no dysuria, change in color of urine, no urgency or frequency.  No flank pain, no hematuria   MS:  No joint pain or swelling.  No decreased range of motion.  No back pain.    Physical Exam  BP (!) 148/74 (BP Location: Left Arm, Patient Position: Sitting, Cuff Size: Normal)   Pulse 89   Temp (!) 97 F (36.1 C) (Temporal)   Ht 5\' 10"  (1.778 m)   Wt 250 lb 9.6 oz (113.7 kg)   SpO2 96%   BMI 35.96 kg/m   GEN: A/Ox3; pleasant , NAD, BMI 35    HEENT:  [place]/AT,   NOSE-clear, THROAT-clear, no lesions, no postnasal drip or  exudate noted.   NECK:  Supple w/ fair ROM; no JVD; normal carotid impulses w/o bruits; no thyromegaly or nodules palpated; no lymphadenopathy.    RESP  Clear  P & A; w/o, wheezes/ rales/ or rhonchi. no accessory muscle use, no dullness to percussion  CARD:  RRR, no m/r/g, tr  peripheral edema, pulses intact, no cyanosis or clubbing.  GI:   Soft & nt; nml bowel sounds; no organomegaly or masses detected.   Musco: Warm bil, no deformities or joint swelling noted.   Neuro: alert, no focal deficits noted.    Skin: Warm, no lesions or rashes    Lab Results:   BMET   BNP No results found for: BNP  Imaging: DG Chest 2 View  Result Date: 12/03/2019 CLINICAL DATA:  Acute bronchitis. History of COPD and asthma. EXAM: CHEST - 2 VIEW COMPARISON:  Radiographs 10/06/2018. CT 02/16/2019. FINDINGS: The heart size and mediastinal contours appear stable post median sternotomy and CABG. There are calcified vein grafts versus coronary artery stents. There is stable perihilar scarring and diffuse central airway thickening. No superimposed airspace disease, pleural effusion or pneumothorax. No acute osseous findings are evident. There are degenerative changes throughout the spine. IMPRESSION: Stable chest with chronic perihilar scarring and central airway thickening which may reflect chronic bronchitis. No acute cardiopulmonary process. Electronically Signed   By: Richardean Sale M.D.   On: 12/03/2019 14:45   CUP PACEART REMOTE DEVICE CHECK  Result Date: 11/08/2019 [facility] summary report received. Battery status OK. Normal device function. No new symptom episodes, tachy episodes, brady, or pause episodes. 220 new AF episodes, longest was 74 minutes, on [place] according to previous reports.  Many episodes were SR with  ectopy.   Monthly summary reports and ROV/PRN Kathy Breach, RN, CCDS, [employer]     PFT Results Latest Ref Rng & Units 09/26/2015  FVC-Pre L 3.57  FVC-Predicted Pre % 74   FVC-Post L 3.90  FVC-Predicted Post % 81  Pre FEV1/FVC % % 71  Post FEV1/FCV % % 75  FEV1-Pre L 2.53  FEV1-Predicted Pre % 70  FEV1-Post L 2.93  DLCO UNC% % 90  DLCO COR %Predicted % 106  TLC L 7.97  TLC % Predicted % 110  RV % Predicted % 118    Lab Results  Component Value Date   NITRICOXIDE 7 10/17/2017        Assessment & Plan:   COPD (chronic obstructive pulmonary disease) (HCC) Flare- hold on steroids for now as recent rectal bleeding  CXR w/ no acute process  Check labs today  [place]  Patient Instructions  Restart TRELEGY 1 puff daily , rinse after use.  Doxycycline 100mg  Twice daily  - to have on hold if symptoms worsen with discolored mucus  I will call with lab results  Mucinex DM Twice daily  As needed  Cough/congestion .  Follow up with Dr. Lamonte Sakai  In 6 weeks and As needed   Please contact office for sooner follow up if symptoms do not improve or worsen or seek emergency care         Rexene Edison, NP 12/03/2019

## 2019-12-03 NOTE — Assessment & Plan Note (Signed)
Flare- hold on steroids for now as recent rectal bleeding  CXR w/ no acute process  Check labs today  [place]  Patient Instructions  Restart TRELEGY 1 puff daily , rinse after use.  Doxycycline 100mg  Twice daily  - to have on hold if symptoms worsen with discolored mucus  I will call with lab results  Mucinex DM Twice daily  As needed  Cough/congestion .  Follow up with Dr. Lamonte Sakai  In 6 weeks and As needed   Please contact office for sooner follow up if symptoms do not improve or worsen or seek emergency care

## 2019-12-04 LAB — BASIC METABOLIC PANEL
BUN/Creatinine Ratio: 6 — ABNORMAL LOW (ref 10–24)
BUN: 6 mg/dL — ABNORMAL LOW (ref 8–27)
CO2: 16 mmol/L — ABNORMAL LOW (ref 20–29)
Calcium: 9.4 mg/dL (ref 8.6–10.2)
Chloride: 104 mmol/L (ref 96–106)
Creatinine, Ser: 1.04 mg/dL (ref 0.76–1.27)
GFR calc Af Amer: 84 mL/min/{1.73_m2} (ref >59)
GFR calc non Af Amer: 73 mL/min/{1.73_m2} (ref >59)
Glucose: 127 mg/dL — ABNORMAL HIGH (ref 65–99)
Potassium: 4.5 mmol/L (ref 3.5–5.2)
Sodium: 141 mmol/L (ref 134–144)

## 2019-12-04 LAB — MAGNESIUM: Magnesium: 2.1 mg/dL (ref 1.6–2.3)

## 2019-12-08 ENCOUNTER — Ambulatory Visit: Payer: Medicare Other | Attending: Internal Medicine

## 2019-12-08 DIAGNOSIS — Z23 Encounter for immunization: Secondary | ICD-10-CM

## 2019-12-08 NOTE — Progress Notes (Signed)
   Covid-19 Vaccination Clinic  Name:  Adam [name]    MRN: [id number] DOB: 31-Oct-1950  12/08/2019  Mr. Schneller was observed post Covid-19 immunization for 15 minutes without incident. He was provided with Vaccine Information Sheet and instruction to access the V-Safe system.   Mr. Marez was instructed to call 911 with any severe reactions post vaccine: Marland Kitchen Difficulty breathing  . Swelling of face and throat  . A fast heartbeat  . A bad rash all over body  . Dizziness and weakness   Immunizations Administered    Name Date Dose VIS Date Route   Pfizer COVID-19 Vaccine 12/08/2019  4:18 PM 0.3 mL 09/14/2019 Intramuscular   Manufacturer: [place]   Lot: [id number]   [place]: [id number]

## 2019-12-10 ENCOUNTER — Ambulatory Visit (INDEPENDENT_AMBULATORY_CARE_PROVIDER_SITE_OTHER): Payer: Medicare Other | Admitting: *Deleted

## 2019-12-10 DIAGNOSIS — I4819 Other persistent atrial fibrillation: Secondary | ICD-10-CM

## 2019-12-10 LAB — CUP PACEART REMOTE DEVICE CHECK
Date Time Interrogation Session: 20210308032704
Implantable Pulse Generator Implant Date: 20200928

## 2019-12-10 NOTE — Progress Notes (Signed)
IL[facility] Remote 

## 2019-12-11 ENCOUNTER — Other Ambulatory Visit: Payer: Self-pay

## 2019-12-11 MED ORDER — TOUJEO SOLOSTAR 300 UNIT/ML ~~LOC~~ SOPN: PEN_INJECTOR | SUBCUTANEOUS | 1 refills | Status: DC

## 2019-12-12 ENCOUNTER — Other Ambulatory Visit: Payer: Self-pay | Admitting: Family Medicine

## 2019-12-24 ENCOUNTER — Ambulatory Visit: Payer: Medicare Other | Admitting: Family Medicine

## 2019-12-25 ENCOUNTER — Ambulatory Visit (INDEPENDENT_AMBULATORY_CARE_PROVIDER_SITE_OTHER): Payer: Medicare Other | Admitting: Family Medicine

## 2019-12-25 ENCOUNTER — Other Ambulatory Visit: Payer: Self-pay

## 2019-12-25 ENCOUNTER — Encounter: Payer: Self-pay | Admitting: Family Medicine

## 2019-12-25 VITALS — BP 136/88 | HR 91 | Temp 97.7°F | Wt 257.5 lb

## 2019-12-25 DIAGNOSIS — M5136 Other intervertebral disc degeneration, lumbar region: Secondary | ICD-10-CM | POA: Diagnosis not present

## 2019-12-25 DIAGNOSIS — E1165 Type 2 diabetes mellitus with hyperglycemia: Secondary | ICD-10-CM

## 2019-12-25 LAB — POCT GLYCOSYLATED HEMOGLOBIN (HGB A1C): Hemoglobin A1C: 6.3 % — AB (ref 4.0–5.6)

## 2019-12-25 NOTE — Progress Notes (Signed)
Subjective:     Patient ID: Adam [name], male   DOB: 1951-08-12, 69 y.o.   MRN: [id number]  HPI Adam [name] is here for follow-up regarding his type 2 diabetes.  He has multiple chronic problems including history of obesity, hypertension, coronary artery disease, atrial fibrillation, asthma, COPD, sleep apnea, hyperlipidemia, past history of alcohol abuse.  He states his blood sugars been stable.  No recent hypoglycemic symptoms.  He is on combination therapy with Metformin, Toujeo insulin, Januvia, and Jardiance  Denies any recent chest pain.  He has chronic shortness of breath which he states is unchanged  Past Medical History:  Diagnosis Date  . Allergy   . Anxiety    history of PTSD following CABG  . Ascending aortic aneurysm ([place]) 01/31/2018   43 x 42 mm, pt unaware  . Asthma   . Cardiomegaly 10/17/2017  . Colitis- colonoscopy 2014 07/13/2015  . COPD (chronic obstructive pulmonary disease) ([facility])   . Coronary artery disease    x 6  . Depression   . Diabetes mellitus without complication ([place])   . Family history of polyps in the colon   . Finger dislocation    Left pinkie  . GERD (gastroesophageal reflux disease)   . Gout   . H/O atrial fibrillation without current medication    following CABG with no documented episodes since then.  . Heart palpitations   . Hx of adenomatous colonic polyps 08/12/2010  . Hyperlipidemia   . Hypertension   . OA (osteoarthritis)   . OSA (obstructive sleep apnea)    Mild, has not received CPAP yet  . Prediabetes   . RLS (restless legs syndrome)   . 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 ANKLE FRACTURE SURGERY Right 1991  . APPENDECTOMY    . ATRIAL FIBRILLATION ABLATION N/A 03/31/2018   Procedure: ATRIAL FIBRILLATION ABLATION;  Surgeon: [name], MD;  Location: [facility];  Service: Cardiovascular;  Laterality: N/A;  . CARDIAC ELECTROPHYSIOLOGY MAPPING AND ABLATION     . COLONOSCOPY W/ BIOPSIES  2017   x7  . CORONARY ANGIOPLASTY WITH STENT PLACEMENT    . CORONARY ARTERY BYPASS GRAFT    . FINGER SURGERY  04/2018   Small finger left hand  . implantable loop recorder placement  07/02/2019   Medtronic Reveal [place] model [id number] ([id number] [id number]) implanted in office by Dr Rayann Heman  . TEE WITHOUT CARDIOVERSION N/A 03/30/2018   Procedure: TRANSESOPHAGEAL ECHOCARDIOGRAM (TEE);  Surgeon: [name], MD;  Location: [facility];  Service: Cardiovascular;  Laterality: N/A;  . TOTAL HIP ARTHROPLASTY Left   . TOTAL HIP ARTHROPLASTY Right 09/12/2018   Procedure: TOTAL HIP ARTHROPLASTY ANTERIOR APPROACH;  Surgeon: [name], MD;  Location: [facility];  Service: Orthopedics;  Laterality: Right;  70 mins    reports that he quit smoking about 42 years ago. His smoking use included cigarettes. He has a 7.00 pack-year smoking history. He has never used smokeless tobacco. He reports current alcohol use of about 3.0 - 4.0 standard drinks of alcohol per week. He reports that he does not use drugs. family history includes Cancer in his father; Colon cancer in his mother; Heart disease in his paternal grandfather and paternal grandmother. Allergies  Allergen Reactions  . Amoxicillin Rash and Other (See Comments)    * SEVERE RASH IN GROIN AREA Has patient had a PCN reaction causing immediate rash, facial/tongue/throat swelling, SOB or lightheadedness with hypotension:  no Has patient had a PCN reaction causing severe rash involving mucus membranes or skin necrosis: no Has patient had a PCN reaction that required hospitalization: no Has patient had a PCN reaction occurring within the last 10 years: yes If all of the above answers are "NO", then may proceed with Cephalosporin use.   . Augmentin [Amoxicillin-Pot Clavulanate] Rash and Other (See Comments)    * SEVERE RASH IN GROIN AREA  . Azithromycin Rash and Other (See Comments)    * SEVERE RASH IN GROIN AREA  .  Clindamycin/Lincomycin Rash  . Keflex [Cephalexin] Rash     Review of Systems  Constitutional: Positive for fatigue.  Eyes: Negative for visual disturbance.  Respiratory: Positive for shortness of breath. Negative for cough and chest tightness.   Cardiovascular: Negative for chest pain, palpitations and leg swelling.  Endocrine: Negative for polydipsia and polyuria.  Neurological: Negative for dizziness, syncope, weakness, light-headedness and headaches.       Objective:   Physical Exam Constitutional:      Appearance: He is well-developed.  HENT:     Right Ear: External ear normal.     Left Ear: External ear normal.  Eyes:     Pupils: Pupils are equal, round, and reactive to light.  Neck:     Thyroid: No thyromegaly.  Cardiovascular:     Rate and Rhythm: Normal rate and regular rhythm.  Pulmonary:     Effort: Pulmonary effort is normal. No respiratory distress.     Breath sounds: Normal breath sounds. No wheezing or rales.  Musculoskeletal:     Cervical back: Neck supple.  Neurological:     Mental Status: He is alert and oriented to person, place, and time.        Assessment:     Type 2 diabetes improved with A1c 6.3%    Plan:     -Continue current regimen.  We will plan 55-month follow-up.  Of A1c even further improved at that point consider discontinuation of Januvia  [name] MD [facility]

## 2019-12-29 ENCOUNTER — Ambulatory Visit: Payer: Medicare Other

## 2020-01-02 ENCOUNTER — Telehealth: Payer: Self-pay | Admitting: Emergency Medicine

## 2020-01-02 ENCOUNTER — Ambulatory Visit: Payer: Medicare Other | Admitting: Acute Care

## 2020-01-02 NOTE — Telephone Encounter (Signed)
Spoke with the pt  He states woke up this morning having increased SOB  He is SOB just at rest and also exertion  Denies any f/c/s, cough, wheezing  Denies any sick contacts or recent travel  SG had 3 pm opening today and I offered televisit but he prefers to come in the office to be seen  I blocked this spot to potentially use for this purpose Please advise, thanks!

## 2020-01-02 NOTE — Telephone Encounter (Signed)
Ok to add to 3 pm opening on my schedule

## 2020-01-02 NOTE — Telephone Encounter (Signed)
Called and spoke to pt. Appt scheduled to see SG in office today at 3pm. Pt verbalized understanding and denied any further questions or concerns at this time.

## 2020-01-06 ENCOUNTER — Other Ambulatory Visit: Payer: Self-pay | Admitting: Family Medicine

## 2020-01-06 DIAGNOSIS — F419 Anxiety disorder, unspecified: Secondary | ICD-10-CM

## 2020-01-07 NOTE — Telephone Encounter (Signed)
Last ov:12/25/19 Last filled:06/25/2019

## 2020-01-08 ENCOUNTER — Ambulatory Visit: Payer: Medicare Other | Attending: Internal Medicine

## 2020-01-08 ENCOUNTER — Ambulatory Visit: Payer: Medicare Other

## 2020-01-08 DIAGNOSIS — Z23 Encounter for immunization: Secondary | ICD-10-CM

## 2020-01-08 NOTE — Progress Notes (Signed)
   Covid-19 Vaccination Clinic  Name:  Adam [name]    MRN: [id number] DOB: 03/31/1951  01/08/2020  Mr. Adam [name] was observed post Covid-19 immunization for 15 minutes without incident. He was provided with Vaccine Information Sheet and instruction to access the V-Safe system.   Mr. Adam [name] was instructed to call 911 with any severe reactions post vaccine: Marland Kitchen Difficulty breathing  . Swelling of face and throat  . A fast heartbeat  . A bad rash all over body  . Dizziness and weakness   Immunizations Administered    Name Date Dose VIS Date Route   Pfizer COVID-19 Vaccine 01/08/2020 10:06 AM 0.3 mL 09/14/2019 Intramuscular   Manufacturer: [employer]   Lot: [id number]   [place]: [id number]

## 2020-01-10 ENCOUNTER — Encounter: Payer: Self-pay | Admitting: Adult Health

## 2020-01-10 ENCOUNTER — Other Ambulatory Visit: Payer: Self-pay

## 2020-01-10 ENCOUNTER — Ambulatory Visit (INDEPENDENT_AMBULATORY_CARE_PROVIDER_SITE_OTHER): Payer: Medicare Other | Admitting: *Deleted

## 2020-01-10 ENCOUNTER — Telehealth: Payer: Self-pay | Admitting: Adult Health

## 2020-01-10 ENCOUNTER — Encounter: Payer: Medicare Other | Admitting: Adult Health

## 2020-01-10 DIAGNOSIS — I480 Paroxysmal atrial fibrillation: Secondary | ICD-10-CM | POA: Diagnosis not present

## 2020-01-10 LAB — CUP PACEART REMOTE DEVICE CHECK
Date Time Interrogation Session: 20210408042930
Implantable Pulse Generator Implant Date: 20200928

## 2020-01-10 NOTE — Progress Notes (Signed)
IL[facility] Remote 

## 2020-01-10 NOTE — Telephone Encounter (Signed)
Pt is scheduled for a f/u with Dr. Lamonte Sakai 4/19. Nothing further needed.

## 2020-01-10 NOTE — Telephone Encounter (Signed)
Called x 3 for telemedicine visit 01/10/2020 from 2:30 to 2:45 pm . No answer or call back. Left message on voice mail .  Will need rescheduling for another day.  Please contact office for sooner follow up if symptoms do not improve or worsen or seek emergency care

## 2020-01-21 ENCOUNTER — Ambulatory Visit: Payer: Medicare Other | Admitting: Emergency Medicine

## 2020-01-22 ENCOUNTER — Other Ambulatory Visit: Payer: Self-pay

## 2020-01-23 ENCOUNTER — Ambulatory Visit (INDEPENDENT_AMBULATORY_CARE_PROVIDER_SITE_OTHER): Payer: Medicare Other | Admitting: Family Medicine

## 2020-01-23 ENCOUNTER — Encounter: Payer: Self-pay | Admitting: Family Medicine

## 2020-01-23 VITALS — BP 162/84 | HR 102 | Temp 97.7°F | Wt 249.7 lb

## 2020-01-23 DIAGNOSIS — F1020 Alcohol dependence, uncomplicated: Secondary | ICD-10-CM | POA: Diagnosis not present

## 2020-01-23 DIAGNOSIS — F339 Major depressive disorder, recurrent, unspecified: Secondary | ICD-10-CM | POA: Diagnosis not present

## 2020-01-23 DIAGNOSIS — S0083XA Contusion of other part of head, initial encounter: Secondary | ICD-10-CM

## 2020-01-23 MED ORDER — DULOXETINE HCL 30 MG PO CPEP: 30.0000 mg | ORAL_CAPSULE | Freq: Every day | ORAL | 0 refills | Status: DC

## 2020-01-23 MED ORDER — DULOXETINE HCL 60 MG PO CPEP: 60.0000 mg | ORAL_CAPSULE | Freq: Every day | ORAL | 3 refills | Status: DC

## 2020-01-23 NOTE — Progress Notes (Signed)
Subjective:     Patient ID: Adam [name], male   DOB: [birth year], 69 y.o.   MRN: [id number]  HPI   Adam [name] is seen accompanied by wife with concerns for increased depression and also increased alcohol use recently.  He drinks scotch and she states that he sometimes drinks "half a bottle "per day.  He has had long history of intermittent alcohol use.  Last night he apparently fell when going to the bathroom.  Per wife, he was intoxicated.   There was no reported loss of consciousness.  Patient has no recollection.  His wife states that he bruised around his right eye but there was no evidence for any seizure or loss of consciousness.  He denies any headache today.  He apparently had some other falls recently but denies any associated injury.  He has chronic back pain which has been poorly controlled.  He is followed by orthopedist and has been getting some type of injection without improvement.  Adam [name] relates that he is drinking partly to try to help ease his back pain.  He has had some mood disturbance with some depressed mood.  Denies any suicidal ideation.  He does not recall prior use of Cymbalta for his chronic pain issues.  He has multiple other chronic cardiac and other medical problems as outlined below.  His diabetes has been relatively well controlled.  Past Medical History:  Diagnosis Date  . Allergy   . Anxiety    history of PTSD following CABG  . Ascending aortic aneurysm ([place]) 01/31/2018   43 x 42 mm, pt unaware  . Asthma   . Cardiomegaly 10/17/2017  . Colitis- colonoscopy 2014 07/13/2015  . COPD (chronic obstructive pulmonary disease) ([place])   . Coronary artery disease    x 6  . Depression   . Diabetes mellitus without complication ([place])   . Family history of polyps in the colon   . Finger dislocation    Left pinkie  . GERD (gastroesophageal reflux disease)   . Gout   . H/O atrial fibrillation without current medication    following CABG with no documented episodes since  then.  . Heart palpitations   . Hx of adenomatous colonic polyps 08/12/2010  . Hyperlipidemia   . Hypertension   . OA (osteoarthritis)   . OSA (obstructive sleep apnea)    Mild, has not received CPAP yet  . Prediabetes   . RLS (restless legs syndrome)   . 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 ANKLE FRACTURE SURGERY Right 1991  . APPENDECTOMY    . ATRIAL FIBRILLATION ABLATION N/A 03/31/2018   Procedure: ATRIAL FIBRILLATION ABLATION;  Surgeon: [name], MD;  Location: [facility];  Service: Cardiovascular;  Laterality: N/A;  . CARDIAC ELECTROPHYSIOLOGY MAPPING AND ABLATION    . COLONOSCOPY W/ BIOPSIES  2017   x7  . CORONARY ANGIOPLASTY WITH STENT PLACEMENT    . CORONARY ARTERY BYPASS GRAFT    . FINGER SURGERY  04/2018   Small finger left hand  . implantable loop recorder placement  07/02/2019   Medtronic Reveal [place] model [id number] ([id number] [id number]) implanted in office by Dr Rayann Heman  . TEE WITHOUT CARDIOVERSION N/A 03/30/2018   Procedure: TRANSESOPHAGEAL ECHOCARDIOGRAM (TEE);  Surgeon: [name], MD;  Location: [facility];  Service: Cardiovascular;  Laterality: N/A;  . TOTAL HIP ARTHROPLASTY Left   . TOTAL HIP ARTHROPLASTY Right 09/12/2018   Procedure: TOTAL HIP  ARTHROPLASTY ANTERIOR APPROACH;  Surgeon: [name], MD;  Location: [facility];  Service: Orthopedics;  Laterality: Right;  70 mins    reports that he quit smoking about 42 years ago. His smoking use included cigarettes. He has a 7.00 pack-year smoking history. He has never used smokeless tobacco. He reports current alcohol use of about 3.0 - 4.0 standard drinks of alcohol per week. He reports that he does not use drugs. family history includes Cancer in his father; Colon cancer in his mother; Heart disease in his paternal grandfather and paternal grandmother. Allergies  Allergen Reactions  . Amoxicillin Rash and Other (See Comments)    * SEVERE RASH  IN GROIN AREA Has patient had a PCN reaction causing immediate rash, facial/tongue/throat swelling, SOB or lightheadedness with hypotension: no Has patient had a PCN reaction causing severe rash involving mucus membranes or skin necrosis: no Has patient had a PCN reaction that required hospitalization: no Has patient had a PCN reaction occurring within the last 10 years: yes If all of the above answers are "NO", then may proceed with Cephalosporin use.   . Augmentin [Amoxicillin-Pot Clavulanate] Rash and Other (See Comments)    * SEVERE RASH IN GROIN AREA  . Azithromycin Rash and Other (See Comments)    * SEVERE RASH IN GROIN AREA  . Clindamycin/Lincomycin Rash  . Keflex [Cephalexin] Rash     Review of Systems  Constitutional: Positive for fatigue. Negative for chills and unexpected weight change.  Respiratory: Negative for shortness of breath.   Cardiovascular: Negative for chest pain.  Gastrointestinal: Negative for abdominal pain, nausea and vomiting.  Genitourinary: Negative for dysuria.  Musculoskeletal: Positive for arthralgias and back pain.  Neurological: Negative for dizziness, seizures, syncope, speech difficulty and headaches.  Psychiatric/Behavioral: Positive for dysphoric mood and sleep disturbance. Negative for confusion and suicidal ideas. The patient is nervous/anxious.        Objective:   Physical Exam Vitals reviewed.  Constitutional:      Appearance: Normal appearance.  HENT:     Head:     Comments: Obvious swelling and bruising around the right eye both inferiorly and superiorly.  He has some swelling and mild tenderness to palpation. Eyes:     Comments: Extraocular movements intact.  No hyphema  Cardiovascular:     Rate and Rhythm: Normal rate.  Pulmonary:     Effort: Pulmonary effort is normal.     Breath sounds: Normal breath sounds.  Musculoskeletal:     Cervical back: Neck supple.  Neurological:     General: No focal deficit present.     Mental  Status: He is alert and oriented to person, place, and time.     Cranial Nerves: No cranial nerve deficit.     Motor: No weakness.  Psychiatric:        Mood and Affect: Mood normal.        Assessment:     #1 alcohol abuse with recent binge drinking  #2 fall last night with contusion right side of face.  He did not report any loss of consciousness.  He has not had any red flags such as confusion today, seizure, vomiting, headache  #3 mood disturbance and depressed mood.  No suicidal ideation  #4 chronic back pain-poorly controlled currently    Plan:     -Long discussion regarding his alcohol abuse.  We have highly recommended consideration for inpatient treatment.  They were given number for [facility] and they will consider looking at inpatient treatment  for that  -Discussed closed head injury and things to watch for  -Consider trial of Cymbalta 30 mg daily for 1 week and then titrate to 60 mg daily and reassess in 3 weeks  -We discussed his chronic medications.  We discussed our reluctance to continue lorazepam in the setting of alcohol abuse and discussed the risk of combining these.  We will discuss tapering off the Lorazepam at follow up.  [name] MD [facility]

## 2020-01-23 NOTE — Patient Instructions (Addendum)
Start the Cymbalta 30 mg once daily for one week and then increase to 60 mg once daily  Stop the alcohol  [facility] [phone]    Head Injury, Adult There are many types of head injuries. Head injuries can be as minor as a bump, or they can be a serious medical issue. More severe head injuries include:  A jarring injury to the brain (concussion).  A bruise (contusion) of the brain. This means there is bleeding in the brain that can cause swelling.  A cracked skull (skull fracture).  Bleeding in the brain that collects, clots, and forms a bump (hematoma). After a head injury, most problems occur within the first 24 hours, but side effects may occur up to 7-10 days after the injury. It is important to watch your condition for any changes. You may need to be observed in the emergency department or urgent care, or you may be admitted to the hospital. What are the causes? There are many possible causes of a head injury. A serious head injury may be caused by a car accident, bicycle or motorcycle accidents, sports injuries, and falls. What are the symptoms? Symptoms of a head injury include a contusion, bump, or bleeding at the site of the injury. Other physical symptoms may include:  Headache.  Nausea or vomiting.  Dizziness.  Feeling tired.  Being uncomfortable around bright lights or loud noises.  Seizures.  Trouble being awakened.  Fainting. Mental or emotional symptoms may include:  Irritability.  Confusion and memory problems.  Poor attention and concentration.  Changes in eating or sleeping habits.  Anxiety or depression. How is this diagnosed? This condition can usually be diagnosed based on your symptoms, a description of the injury, and a physical exam. You may also have imaging tests done, such as a CT scan or MRI. How is this treated? Treatment for this condition depends on the severity and type of injury you have. The main goal of treatment is to  prevent complications and to allow the brain time to heal. Mild head injury If you have a mild head injury, you may be sent home and treatment may include:  Observation. A responsible adult should stay with you for 24 hours after your injury and check on you often.  Physical rest.  Brain rest.  Pain medicines. Severe head injury If you have a severe head injury, treatment may include:  Close observation. This includes hospitalization with frequent physical exams.  Medicines to relieve pain, prevent seizures, and decrease brain swelling.  Breathing support. This may include using a ventilator.  Treatments to manage the swelling inside the brain.  Brain surgery. This may be needed to: ? Remove a blood clot. ? Stop the bleeding. ? Remove a part of the skull to allow room for the brain to swell. Follow these instructions at home: Activity  Rest and avoid activities that are physically hard or tiring.  Make sure you get enough sleep.  Limit activities that require a lot of thought or attention, such as: ? Watching TV. ? Playing memory games and puzzles. ? Job-related work or homework. ? Working on [employer], and texting.  Avoid activities that could cause another head injury, such as playing sports, until your health care provider approves. Having another head injury, especially before the first one has healed, can be dangerous.  Ask your health care provider when it is safe for you to return to your regular activities, including work or school. Ask your  health care provider for a step-by-step plan for gradually returning to activities.  Ask your health care provider when you can drive, ride a bicycle, or use heavy machinery. Your ability to react may be slower after a brain injury. Do not do these activities if you are dizzy. Lifestyle   Do not drink alcohol until your health care provider approves. Do not use drugs. Alcohol and certain drugs may slow  your recovery and can put you at risk of further injury.  If it is harder than usual to remember things, write them down.  If you are easily distracted, try to do one thing at a time.  Talk with family members or close friends when making important decisions.  Tell your friends, family, a trusted colleague, and work Fre[employer] about your injury, symptoms, and restrictions. Have them watch for any new or worsening problems. General instructions  Take over-the-counter and prescription medicines only as told by your health care provider.  Have someone stay with you for 24 hours after your head injury. This person should watch you for any changes in your symptoms and be ready to seek medical help.  Keep all follow-up visits as told by your health care provider. This is important. How is this prevented?  Work on improving your balance and strength to avoid falls.  Wear a seatbelt when you are in a moving vehicle.  Wear a helmet when riding a bicycle, skiing, or doing any other sport or activity that has a risk of injury.  If you drink alcohol: ? Limit how much you use to:  0-1 drink a day for women.  0-2 drinks a day for men. ? Be aware of how much alcohol is in your drink. In the [place] one drink equals one 12 oz bottle of beer (355 mL), one 5 oz glass of wine (148 mL), or one 1 oz glass of hard liquor (44 mL).  Take safety measures in your home, such as: ? Removing clutter and tripping hazards from floors and stairways. ? Using grab bars in bathrooms and handrails by stairs. ? Placing non-slip mats on floors and in bathtubs. ? Improving lighting in dim areas. Get help right away if:  You have: ? A severe headache that is not helped by medicine. ? Trouble walking or weakness in your arms and legs. ? Clear or bloody fluid coming from your nose or ears. ? Changes in your vision. ? A seizure.  You lose your balance.  You vomit.  Your pupils change size.  Your speech is  slurred.  Your dizziness gets worse.  You faint.  You are sleepier than normal and have trouble staying awake.  Your symptoms get worse. These symptoms may represent a serious problem that is an emergency. Do not wait to see if the symptoms will go away. Get medical help right away. Call your local emergency services (911 in the [place] Do not drive yourself to the hospital. Summary  Head injuries can be minor or they can be a serious medical issue requiring immediate attention.  Treatment for this condition depends on the severity and type of injury you have.  Ask your health care provider when it is safe for you to return to your regular activities, including work or school.  Head injury prevention includes wearing a seat belt in a motor vehicle, using a helmet on a bicycle, limiting alcohol use, and taking safety measures in your home. This information is not intended to replace advice given to you  by your health care provider. Make sure you discuss any questions you have with your health care provider. Document Revised: 10/18/2018 Document Reviewed: 10/13/2018 Elsevier Patient Education  [place].

## 2020-01-25 ENCOUNTER — Telehealth: Payer: Self-pay | Admitting: *Deleted

## 2020-01-25 NOTE — Telephone Encounter (Signed)
Lm to discuss

## 2020-01-25 NOTE — Telephone Encounter (Signed)
[facility]Q alert received for 2 tachy episodes. Available ECG appears AF w/RVR, 1.24min duration, median V rate 231bpm. Presenting rhythm as of 01/25/20 at 04:50 shows AF, V rates controlled. Cardiac Compass shows increased AT/AF burden since ~4/20, overall burden 1.2% since 07/13/19.  Spoke with patient. He reports noticing more frequent tachycardia, particularly in the morning. Denies increased SOB, chest discomfort, or pre-syncope/syncope. Pt reports missed doses of some medications in the past few weeks, including Tikosyn and metoprolol. Reports he has gotten "off track" and had some trouble, including a fall a few days ago. Reviewed Dr. Erick Blinks note from 4/21. Pt is not currently on [place]. Encouraged compliance with medications. Reviewed ED precautions for new or worsening symptoms. Pt verbalizes understanding and agreement with plan.   Pt is [facility]u in [facility] on [facility]20 with Adline Peals, PA. Pt is interested in sooner f/u if possible. Will route to [facility] staff for further management.

## 2020-02-05 ENCOUNTER — Telehealth: Payer: Self-pay | Admitting: *Deleted

## 2020-02-05 DIAGNOSIS — I712 Thoracic aortic aneurysm, without rupture, unspecified: Secondary | ICD-10-CM

## 2020-02-05 NOTE — Telephone Encounter (Signed)
-----   Message from [place], [place] sent at 02/05/2020  9:09 AM EDT ----- Regarding: RE: Labs needed Sorry to send multiple messages but it will need to be BMET and Creatinine ----- Message ----- From: Nuala Alpha, LPN Sent: [phone]   8:17 AM EDT To: Margretta Ditty, NT Subject: FW: Labs needed                                So you just need [place] to place the BMET order right?  ----- Message ----- From: Margretta Ditty, NT Sent: 02/05/2020   8:15 AM EDT To: Nuala Alpha, LPN Subject: RE: Labs needed                                Its the Angio chest for Adam [name] ----- Message ----- From: Nuala Alpha, LPN Sent: [id number]   4:56 PM EDT To: Margretta Ditty, NT Subject: FW: Labs needed                                Hey this is triage.  Who will the BMET order go under.  Is this a [facility] pt or a [facility] pt?  Thanks, Zan Triska in Triage ----- Message ----- From: Margretta Ditty, NT Sent: 02/04/2020   4:32 PM EDT To: Rebeca Alert [facility] [address] Triage Subject: Labs needed                                    This patient has a year follow up in May for Angio Chest Aorta W/CM and OR WO/CM. This patient will need lab work done before his appointment.              Thanks,   Margretta Ditty

## 2020-02-05 NOTE — Telephone Encounter (Signed)
Order for BMET placed under pts Primary Cardiologist Dr. Radford Pax, per CT protocol, for upcoming CT Angio pt will be having here at our office.  Will make Tech in CT aware that lab order has been placed, so that the pt can have a lab appt and his CT Angio scheduled.  Will also route this message to Dr. Theodosia Blender covering RN, so that she can follow lab and image that needs to be scheduled.

## 2020-02-08 ENCOUNTER — Emergency Department (HOSPITAL_COMMUNITY): Payer: Medicare Other

## 2020-02-08 ENCOUNTER — Encounter (HOSPITAL_COMMUNITY): Payer: Self-pay | Admitting: Emergency Medicine

## 2020-02-08 ENCOUNTER — Inpatient Hospital Stay (HOSPITAL_COMMUNITY)
Admission: EM | Admit: 2020-02-08 | Discharge: 2020-02-18 | DRG: 637 | Disposition: A | Discharge status: Skilled Nursing Facility | Payer: Medicare Other | Attending: Internal Medicine | Admitting: Internal Medicine

## 2020-02-08 DIAGNOSIS — I712 Thoracic aortic aneurysm, without rupture: Secondary | ICD-10-CM | POA: Diagnosis not present

## 2020-02-08 DIAGNOSIS — I2510 Atherosclerotic heart disease of native coronary artery without angina pectoris: Secondary | ICD-10-CM | POA: Diagnosis present

## 2020-02-08 DIAGNOSIS — S0181XA Laceration without foreign body of other part of head, initial encounter: Secondary | ICD-10-CM | POA: Diagnosis present

## 2020-02-08 DIAGNOSIS — H5500 Unspecified nystagmus: Secondary | ICD-10-CM | POA: Diagnosis present

## 2020-02-08 DIAGNOSIS — F10231 Alcohol dependence with withdrawal delirium: Secondary | ICD-10-CM | POA: Diagnosis not present

## 2020-02-08 DIAGNOSIS — I4891 Unspecified atrial fibrillation: Secondary | ICD-10-CM | POA: Diagnosis not present

## 2020-02-08 DIAGNOSIS — Z79899 Other long term (current) drug therapy: Secondary | ICD-10-CM

## 2020-02-08 DIAGNOSIS — Z6834 Body mass index (BMI) 34.0-34.9, adult: Secondary | ICD-10-CM

## 2020-02-08 DIAGNOSIS — G912 (Idiopathic) normal pressure hydrocephalus: Secondary | ICD-10-CM | POA: Diagnosis not present

## 2020-02-08 DIAGNOSIS — F1020 Alcohol dependence, uncomplicated: Secondary | ICD-10-CM | POA: Diagnosis present

## 2020-02-08 DIAGNOSIS — F419 Anxiety disorder, unspecified: Secondary | ICD-10-CM | POA: Diagnosis present

## 2020-02-08 DIAGNOSIS — E43 Unspecified severe protein-calorie malnutrition: Secondary | ICD-10-CM | POA: Diagnosis present

## 2020-02-08 DIAGNOSIS — G4733 Obstructive sleep apnea (adult) (pediatric): Secondary | ICD-10-CM | POA: Diagnosis present

## 2020-02-08 DIAGNOSIS — Z20822 Contact with and (suspected) exposure to covid-19: Secondary | ICD-10-CM | POA: Diagnosis present

## 2020-02-08 DIAGNOSIS — R1312 Dysphagia, oropharyngeal phase: Secondary | ICD-10-CM | POA: Diagnosis not present

## 2020-02-08 DIAGNOSIS — M2550 Pain in unspecified joint: Secondary | ICD-10-CM | POA: Diagnosis not present

## 2020-02-08 DIAGNOSIS — Z96643 Presence of artificial hip joint, bilateral: Secondary | ICD-10-CM | POA: Diagnosis present

## 2020-02-08 DIAGNOSIS — E876 Hypokalemia: Secondary | ICD-10-CM | POA: Diagnosis not present

## 2020-02-08 DIAGNOSIS — Z87891 Personal history of nicotine dependence: Secondary | ICD-10-CM

## 2020-02-08 DIAGNOSIS — Z794 Long term (current) use of insulin: Secondary | ICD-10-CM

## 2020-02-08 DIAGNOSIS — Z85828 Personal history of other malignant neoplasm of skin: Secondary | ICD-10-CM

## 2020-02-08 DIAGNOSIS — F1027 Alcohol dependence with alcohol-induced persisting dementia: Secondary | ICD-10-CM | POA: Diagnosis present

## 2020-02-08 DIAGNOSIS — R296 Repeated falls: Secondary | ICD-10-CM | POA: Diagnosis not present

## 2020-02-08 DIAGNOSIS — E1110 Type 2 diabetes mellitus with ketoacidosis without coma: Principal | ICD-10-CM | POA: Diagnosis present

## 2020-02-08 DIAGNOSIS — M109 Gout, unspecified: Secondary | ICD-10-CM | POA: Diagnosis present

## 2020-02-08 DIAGNOSIS — E512 Wernicke's encephalopathy: Secondary | ICD-10-CM | POA: Diagnosis not present

## 2020-02-08 DIAGNOSIS — Z881 Allergy status to other antibiotic agents status: Secondary | ICD-10-CM

## 2020-02-08 DIAGNOSIS — I340 Nonrheumatic mitral (valve) insufficiency: Secondary | ICD-10-CM | POA: Diagnosis not present

## 2020-02-08 DIAGNOSIS — W01198A Fall on same level from slipping, tripping and stumbling with subsequent striking against other object, initial encounter: Secondary | ICD-10-CM | POA: Diagnosis present

## 2020-02-08 DIAGNOSIS — G319 Degenerative disease of nervous system, unspecified: Secondary | ICD-10-CM | POA: Diagnosis present

## 2020-02-08 DIAGNOSIS — J449 Chronic obstructive pulmonary disease, unspecified: Secondary | ICD-10-CM | POA: Diagnosis not present

## 2020-02-08 DIAGNOSIS — E785 Hyperlipidemia, unspecified: Secondary | ICD-10-CM | POA: Diagnosis present

## 2020-02-08 DIAGNOSIS — Z86010 Personal history of colonic polyps: Secondary | ICD-10-CM

## 2020-02-08 DIAGNOSIS — E8729 Other acidosis: Secondary | ICD-10-CM | POA: Diagnosis present

## 2020-02-08 DIAGNOSIS — M6281 Muscle weakness (generalized): Secondary | ICD-10-CM | POA: Diagnosis not present

## 2020-02-08 DIAGNOSIS — S0083XA Contusion of other part of head, initial encounter: Secondary | ICD-10-CM

## 2020-02-08 DIAGNOSIS — R2681 Unsteadiness on feet: Secondary | ICD-10-CM | POA: Diagnosis not present

## 2020-02-08 DIAGNOSIS — Z9181 History of falling: Secondary | ICD-10-CM | POA: Diagnosis not present

## 2020-02-08 DIAGNOSIS — Z66 Do not resuscitate: Secondary | ICD-10-CM | POA: Diagnosis present

## 2020-02-08 DIAGNOSIS — G2581 Restless legs syndrome: Secondary | ICD-10-CM | POA: Diagnosis present

## 2020-02-08 DIAGNOSIS — Z888 Allergy status to other drugs, medicaments and biological substances status: Secondary | ICD-10-CM

## 2020-02-08 DIAGNOSIS — I480 Paroxysmal atrial fibrillation: Secondary | ICD-10-CM | POA: Diagnosis present

## 2020-02-08 DIAGNOSIS — E538 Deficiency of other specified B group vitamins: Secondary | ICD-10-CM | POA: Diagnosis present

## 2020-02-08 DIAGNOSIS — E872 Acidosis: Secondary | ICD-10-CM

## 2020-02-08 DIAGNOSIS — F329 Major depressive disorder, single episode, unspecified: Secondary | ICD-10-CM | POA: Diagnosis present

## 2020-02-08 DIAGNOSIS — Y92002 Bathroom of unspecified non-institutional (private) residence single-family (private) house as the place of occurrence of the external cause: Secondary | ICD-10-CM

## 2020-02-08 DIAGNOSIS — R2689 Other abnormalities of gait and mobility: Secondary | ICD-10-CM | POA: Diagnosis not present

## 2020-02-08 DIAGNOSIS — G9340 Encephalopathy, unspecified: Secondary | ICD-10-CM | POA: Diagnosis not present

## 2020-02-08 DIAGNOSIS — S299XXA Unspecified injury of thorax, initial encounter: Secondary | ICD-10-CM | POA: Diagnosis not present

## 2020-02-08 DIAGNOSIS — G9389 Other specified disorders of brain: Secondary | ICD-10-CM | POA: Diagnosis present

## 2020-02-08 DIAGNOSIS — Z800 Family history of malignant neoplasm of digestive organs: Secondary | ICD-10-CM

## 2020-02-08 DIAGNOSIS — F1010 Alcohol abuse, uncomplicated: Secondary | ICD-10-CM | POA: Diagnosis present

## 2020-02-08 DIAGNOSIS — Z951 Presence of aortocoronary bypass graft: Secondary | ICD-10-CM

## 2020-02-08 DIAGNOSIS — I10 Essential (primary) hypertension: Secondary | ICD-10-CM | POA: Diagnosis present

## 2020-02-08 DIAGNOSIS — F4310 Post-traumatic stress disorder, unspecified: Secondary | ICD-10-CM | POA: Diagnosis not present

## 2020-02-08 DIAGNOSIS — Z7401 Bed confinement status: Secondary | ICD-10-CM | POA: Diagnosis not present

## 2020-02-08 DIAGNOSIS — R41841 Cognitive communication deficit: Secondary | ICD-10-CM | POA: Diagnosis not present

## 2020-02-08 DIAGNOSIS — R519 Headache, unspecified: Secondary | ICD-10-CM | POA: Diagnosis not present

## 2020-02-08 DIAGNOSIS — R4182 Altered mental status, unspecified: Secondary | ICD-10-CM | POA: Diagnosis not present

## 2020-02-08 DIAGNOSIS — I499 Cardiac arrhythmia, unspecified: Secondary | ICD-10-CM | POA: Diagnosis not present

## 2020-02-08 DIAGNOSIS — Z8249 Family history of ischemic heart disease and other diseases of the circulatory system: Secondary | ICD-10-CM

## 2020-02-08 LAB — URINALYSIS, ROUTINE W REFLEX MICROSCOPIC
Bacteria, UA: NONE SEEN
Bilirubin Urine: NEGATIVE
Glucose, UA: >=500 mg/dL — AB
Ketones, ur: 80 mg/dL — AB
Leukocytes,Ua: NEGATIVE
Nitrite: NEGATIVE
Protein, ur: 100 mg/dL — AB
Specific Gravity, Urine: 1.021 (ref 1.005–1.030)
pH: 5 (ref 5.0–8.0)

## 2020-02-08 LAB — COMPREHENSIVE METABOLIC PANEL
ALT: 29 U/L (ref 0–44)
AST: 39 U/L (ref 15–41)
Albumin: 4.1 g/dL (ref 3.5–5.0)
Alkaline Phosphatase: 80 U/L (ref 38–126)
Anion gap: 29 — ABNORMAL HIGH (ref 5–15)
BUN: 13 mg/dL (ref 8–23)
CO2: 14 mmol/L — ABNORMAL LOW (ref 22–32)
Calcium: 10.2 mg/dL (ref 8.9–10.3)
Chloride: 94 mmol/L — ABNORMAL LOW (ref 98–111)
Creatinine, Ser: 1.19 mg/dL (ref 0.61–1.24)
GFR calc Af Amer: >60 mL/min (ref >60)
GFR calc non Af Amer: >60 mL/min (ref >60)
Glucose, Bld: 159 mg/dL — ABNORMAL HIGH (ref 70–99)
Potassium: 2.1 mmol/L — CL (ref 3.5–5.1)
Sodium: 137 mmol/L (ref 135–145)
Total Bilirubin: 1.7 mg/dL — ABNORMAL HIGH (ref 0.3–1.2)
Total Protein: 8.2 g/dL — ABNORMAL HIGH (ref 6.5–8.1)

## 2020-02-08 LAB — TSH: TSH: 3.137 u[IU]/mL (ref 0.350–4.500)

## 2020-02-08 LAB — RESPIRATORY PANEL BY RT PCR (FLU A&B, COVID)
Influenza A by PCR: NEGATIVE
Influenza B by PCR: NEGATIVE
SARS Coronavirus 2 by RT PCR: NEGATIVE

## 2020-02-08 LAB — POCT I-STAT EG7
Acid-base deficit: 9 mmol/L — ABNORMAL HIGH (ref 0.0–2.0)
Bicarbonate: 15.4 mmol/L — ABNORMAL LOW (ref 20.0–28.0)
Calcium, Ion: 1.2 mmol/L (ref 1.15–1.40)
HCT: 46 % (ref 39.0–52.0)
Hemoglobin: 15.6 g/dL (ref 13.0–17.0)
O2 Saturation: 96 %
Potassium: 2 mmol/L — CL (ref 3.5–5.1)
Sodium: 139 mmol/L (ref 135–145)
TCO2: 16 mmol/L — ABNORMAL LOW (ref 22–32)
pCO2, Ven: 28.3 mmHg — ABNORMAL LOW (ref 44.0–60.0)
pH, Ven: 7.343 (ref 7.250–7.430)
pO2, Ven: 83 mmHg — ABNORMAL HIGH (ref 32.0–45.0)

## 2020-02-08 LAB — CBC
HCT: 38.8 % — ABNORMAL LOW (ref 39.0–52.0)
Hemoglobin: 12.6 g/dL — ABNORMAL LOW (ref 13.0–17.0)
MCH: 30.4 pg (ref 26.0–34.0)
MCHC: 32.5 g/dL (ref 30.0–36.0)
MCV: 93.7 fL (ref 80.0–100.0)
Platelets: 246 10*3/uL (ref 150–400)
RBC: 4.14 MIL/uL — ABNORMAL LOW (ref 4.22–5.81)
RDW: 14.4 % (ref 11.5–15.5)
WBC: 13.4 10*3/uL — ABNORMAL HIGH (ref 4.0–10.5)
nRBC: 0 % (ref 0.0–0.2)

## 2020-02-08 LAB — CBG MONITORING, ED: Glucose-Capillary: 145 mg/dL — ABNORMAL HIGH (ref 70–99)

## 2020-02-08 LAB — LIPASE, BLOOD: Lipase: 61 U/L — ABNORMAL HIGH (ref 11–51)

## 2020-02-08 LAB — TROPONIN I (HIGH SENSITIVITY): Troponin I (High Sensitivity): 30 ng/L — ABNORMAL HIGH (ref <18)

## 2020-02-08 LAB — CBC WITH DIFFERENTIAL/PLATELET
Abs Immature Granulocytes: 0.18 10*3/uL — ABNORMAL HIGH (ref 0.00–0.07)
Basophils Absolute: 0.1 10*3/uL (ref 0.0–0.1)
Basophils Relative: 1 %
Eosinophils Absolute: 0 10*3/uL (ref 0.0–0.5)
Eosinophils Relative: 0 %
HCT: 46.5 % (ref 39.0–52.0)
Hemoglobin: 15.1 g/dL (ref 13.0–17.0)
Immature Granulocytes: 2 %
Lymphocytes Relative: 9 %
Lymphs Abs: 1 10*3/uL (ref 0.7–4.0)
MCH: 30.2 pg (ref 26.0–34.0)
MCHC: 32.5 g/dL (ref 30.0–36.0)
MCV: 93 fL (ref 80.0–100.0)
Monocytes Absolute: 1.3 10*3/uL — ABNORMAL HIGH (ref 0.1–1.0)
Monocytes Relative: 11 %
Neutro Abs: 9.2 10*3/uL — ABNORMAL HIGH (ref 1.7–7.7)
Neutrophils Relative %: 77 %
Platelets: 296 10*3/uL (ref 150–400)
RBC: 5 MIL/uL (ref 4.22–5.81)
RDW: 14.3 % (ref 11.5–15.5)
WBC: 11.7 10*3/uL — ABNORMAL HIGH (ref 4.0–10.5)
nRBC: 0 % (ref 0.0–0.2)

## 2020-02-08 LAB — BETA-HYDROXYBUTYRIC ACID: Beta-Hydroxybutyric Acid: >8 mmol/L — ABNORMAL HIGH (ref 0.05–0.27)

## 2020-02-08 LAB — MAGNESIUM: Magnesium: 2 mg/dL (ref 1.7–2.4)

## 2020-02-08 LAB — LACTIC ACID, PLASMA: Lactic Acid, Venous: 2.3 mmol/L (ref 0.5–1.9)

## 2020-02-08 MED ORDER — FENOFIBRATE 48 MG PO TABS
192.0000 mg | ORAL_TABLET | Freq: Every day | ORAL | Status: DC
  Filled 2020-02-08: qty 4

## 2020-02-08 MED ORDER — METOPROLOL TARTRATE 100 MG PO TABS
100.0000 mg | ORAL_TABLET | Freq: Two times a day (BID) | ORAL | Status: DC
  Administered 2020-02-09 – 2020-02-17 (×17): 100 mg via ORAL
  Filled 2020-02-08 (×17): qty 1

## 2020-02-08 MED ORDER — POTASSIUM CHLORIDE CRYS ER 20 MEQ PO TBCR: 40.0000 meq | EXTENDED_RELEASE_TABLET | ORAL | Status: DC

## 2020-02-08 MED ORDER — LACTATED RINGERS IV SOLN: INTRAVENOUS | Status: DC

## 2020-02-08 MED ORDER — DOFETILIDE 125 MCG PO CAPS
125.0000 ug | ORAL_CAPSULE | Freq: Two times a day (BID) | ORAL | Status: DC
  Administered 2020-02-09 – 2020-02-17 (×14): 125 ug via ORAL
  Filled 2020-02-08 (×17): qty 1

## 2020-02-08 MED ORDER — DULOXETINE HCL 60 MG PO CPEP
60.0000 mg | ORAL_CAPSULE | Freq: Every day | ORAL | Status: DC
  Administered 2020-02-09 – 2020-02-18 (×8): 60 mg via ORAL
  Filled 2020-02-08 (×9): qty 1

## 2020-02-08 MED ORDER — FLUTICASONE FUROATE-VILANTEROL 100-25 MCG/INH IN AEPB
1.0000 | INHALATION_SPRAY | Freq: Every day | RESPIRATORY_TRACT | Status: DC
  Administered 2020-02-09 – 2020-02-18 (×9): 1 via RESPIRATORY_TRACT
  Filled 2020-02-08: qty 28

## 2020-02-08 MED ORDER — ENOXAPARIN SODIUM 40 MG/0.4ML ~~LOC~~ SOLN
40.0000 mg | SUBCUTANEOUS | Status: DC
  Administered 2020-02-11 – 2020-02-17 (×7): 40 mg via SUBCUTANEOUS
  Filled 2020-02-08 (×8): qty 0.4

## 2020-02-08 MED ORDER — ALLOPURINOL 300 MG PO TABS
300.0000 mg | ORAL_TABLET | Freq: Every day | ORAL | Status: DC
  Administered 2020-02-09 – 2020-02-18 (×8): 300 mg via ORAL
  Filled 2020-02-08 (×9): qty 1

## 2020-02-08 MED ORDER — LACTATED RINGERS IV BOLUS
1000.0000 mL | Freq: Once | INTRAVENOUS | Status: AC
  Administered 2020-02-08: 1000 mL via INTRAVENOUS

## 2020-02-08 MED ORDER — FENOFIBRATE 160 MG PO TABS
160.0000 mg | ORAL_TABLET | Freq: Every day | ORAL | Status: DC
  Administered 2020-02-09 – 2020-02-18 (×8): 160 mg via ORAL
  Filled 2020-02-08 (×9): qty 1

## 2020-02-08 MED ORDER — METOPROLOL TARTRATE 5 MG/5ML IV SOLN: 2.5000 mg | Freq: Once | INTRAVENOUS | Status: DC

## 2020-02-08 MED ORDER — ICOSAPENT ETHYL 1 G PO CAPS
2.0000 g | ORAL_CAPSULE | Freq: Two times a day (BID) | ORAL | Status: DC
  Administered 2020-02-09 – 2020-02-18 (×16): 2 g via ORAL
  Filled 2020-02-08 (×21): qty 2

## 2020-02-08 MED ORDER — POTASSIUM CHLORIDE 10 MEQ/100ML IV SOLN
10.0000 meq | INTRAVENOUS | Status: AC
  Administered 2020-02-08 – 2020-02-09 (×4): 10 meq via INTRAVENOUS
  Filled 2020-02-08 (×5): qty 100

## 2020-02-08 MED ORDER — ROSUVASTATIN CALCIUM 20 MG PO TABS
40.0000 mg | ORAL_TABLET | Freq: Every day | ORAL | Status: DC
  Administered 2020-02-09 – 2020-02-18 (×8): 40 mg via ORAL
  Filled 2020-02-08 (×10): qty 2

## 2020-02-08 MED ORDER — LOSARTAN POTASSIUM 50 MG PO TABS
100.0000 mg | ORAL_TABLET | Freq: Every day | ORAL | Status: DC
  Administered 2020-02-09 – 2020-02-12 (×4): 100 mg via ORAL
  Filled 2020-02-08 (×4): qty 2

## 2020-02-08 MED ORDER — UMECLIDINIUM BROMIDE 62.5 MCG/INH IN AEPB
1.0000 | INHALATION_SPRAY | Freq: Every day | RESPIRATORY_TRACT | Status: DC
  Administered 2020-02-09 – 2020-02-17 (×8): 1 via RESPIRATORY_TRACT
  Filled 2020-02-08: qty 7

## 2020-02-08 MED ORDER — METOPROLOL TARTRATE 5 MG/5ML IV SOLN
5.0000 mg | Freq: Once | INTRAVENOUS | Status: AC
  Administered 2020-02-08: 5 mg via INTRAVENOUS
  Filled 2020-02-08: qty 5

## 2020-02-08 NOTE — H&P (Addendum)
History and Physical    PERKINS EDGE [id number] DOB: Jan 08, 1951 DOA: 02/08/2020  PCP: [name], MD  Patient coming from: Home.  Chief Complaint: Fall.  HPI: Adam [name] is a 68 y.o. male with history of diabetes mellitus type 2, CAD status post CABG, A. fib not on anticoagulation probably from risk of falls with history of hypertension alcohol abuse presents to the ER the patient had a fall at home.  As per the report patient has been having frequent falls recently and after yesterday's fall patient was not able to get up and the neighbor had to come and help him.  Patient states he did not have any chest pain or shortness of breath denies any nausea vomiting or diarrhea.  Admits to drinking alcohol every day.  ED Course: In the ER patient was in A. fib with RVR heart rate improved with IV metoprolol and after giving p.o. metoprolol and Tikosyn.  Patient's anion gap was 28 with bicarb of 14 blood glucose 158.  Urine shows ketones.  Lactic acid was only mildly elevated.  Suspect patient is in DKA but since due to very low potassium of 2.1 insulin infusion and not started but patient was given fluid bolus 2 L heart which heart rate improved and awaiting to improve potassium before starting IV insulin.  UA shows ketones.  Covid test was negative.  Patient denies taking any Goody powders or BC powder.  Review of Systems: As per HPI, rest all negative.   Past Medical History:  Diagnosis Date  . Allergy   . Anxiety    history of PTSD following CABG  . Ascending aortic aneurysm ([place]) 01/31/2018   43 x 42 mm, pt unaware  . Asthma   . Cardiomegaly 10/17/2017  . Colitis- colonoscopy 2014 07/13/2015  . COPD (chronic obstructive pulmonary disease) ([place])   . Coronary artery disease    x 6  . Depression   . Diabetes mellitus without complication ([place])   . Family history of polyps in the colon   . Finger dislocation    Left pinkie  . GERD (gastroesophageal reflux disease)     . Gout   . H/O atrial fibrillation without current medication    following CABG with no documented episodes since then.  . Heart palpitations   . Hx of adenomatous colonic polyps 08/12/2010  . Hyperlipidemia   . Hypertension   . OA (osteoarthritis)   . OSA (obstructive sleep apnea)    Mild, has not received CPAP yet  . Prediabetes   . RLS (restless legs syndrome)   . 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 ANKLE FRACTURE SURGERY Right 1991  . APPENDECTOMY    . ATRIAL FIBRILLATION ABLATION N/A 03/31/2018   Procedure: ATRIAL FIBRILLATION ABLATION;  Surgeon: [name], MD;  Location: [facility];  Service: Cardiovascular;  Laterality: N/A;  . CARDIAC ELECTROPHYSIOLOGY MAPPING AND ABLATION    . COLONOSCOPY W/ BIOPSIES  2017   x7  . CORONARY ANGIOPLASTY WITH STENT PLACEMENT    . CORONARY ARTERY BYPASS GRAFT    . FINGER SURGERY  04/2018   Small finger left hand  . implantable loop recorder placement  07/02/2019   Medtronic Reveal Rhodes model [id number] ([id number] [id number]) implanted in office by Dr Rayann Heman  . TEE WITHOUT CARDIOVERSION N/A 03/30/2018   Procedure: TRANSESOPHAGEAL ECHOCARDIOGRAM (TEE);  Surgeon: [name], MD;  Location: [facility];  Service: Cardiovascular;  Laterality: N/A;  . TOTAL HIP ARTHROPLASTY Left   . TOTAL HIP ARTHROPLASTY Right 09/12/2018   Procedure: TOTAL HIP ARTHROPLASTY ANTERIOR APPROACH;  Surgeon: [name], MD;  Location: [facility];  Service: Orthopedics;  Laterality: Right;  70 mins     reports that he quit smoking about 42 years ago. His smoking use included cigarettes. He has a 7.00 pack-year smoking history. He has never used smokeless tobacco. He reports current alcohol use of about 3.0 - 4.0 standard drinks of alcohol per week. He reports that he does not use drugs.  Allergies  Allergen Reactions  . Xarelto [Rivaroxaban] Other (See Comments)    Patient stated he "ended up in  the hospital with a rectal bleed"  . Amoxicillin Rash and Other (See Comments)    * SEVERE RASH IN GROIN AREA Has patient had a PCN reaction causing immediate rash, facial/tongue/throat swelling, SOB or lightheadedness with hypotension: no Has patient had a PCN reaction causing severe rash involving mucus membranes or skin necrosis: no Has patient had a PCN reaction that required hospitalization: no Has patient had a PCN reaction occurring within the last 10 years: yes If all of the above answers are "NO", then may proceed with Cephalosporin use.   . Augmentin [Amoxicillin-Pot Clavulanate] Rash and Other (See Comments)    * SEVERE RASH IN GROIN AREA  . Azithromycin Rash and Other (See Comments)    * SEVERE RASH IN GROIN AREA  . Clindamycin/Lincomycin Rash  . Keflex [Cephalexin] Rash    Family History  Problem Relation Age of Onset  . Colon cancer Mother   . Cancer Father        UNKNOWN TYPE  . Heart disease Paternal Grandmother   . Heart disease Paternal Grandfather     Prior to Admission medications   Medication Sig Start Date End Date Taking? Authorizing Provider  allopurinol (ZYLOPRIM) 300 MG tablet Take 1 tablet (300 mg total) by mouth daily. 09/24/19  Yes [name], Alinda Sierras, MD  Coenzyme Q10 (CO Q-10) 100 MG CAPS Take 100 mg by mouth daily.    Yes [provider]  dofetilide (TIKOSYN) 125 MCG capsule Take 1 capsule (125 mcg total) by mouth 2 (two) times daily. May have 90 day refills if patient prefers Patient taking differently: Take 125 mcg by mouth 2 (two) times daily.  10/01/19  Yes Allred, Jeneen Rinks, MD  DULoxetine (CYMBALTA) 60 MG capsule Take 1 capsule (60 mg total) by mouth daily. 01/23/20  Yes [name], Alinda Sierras, MD  fenofibrate micronized (LOFIBRA) 200 MG capsule Take 1 capsule (200 mg total) by mouth daily before breakfast. 08/09/19  Yes Turner, Eber Hong, MD  fluticasone (FLONASE) 50 MCG/ACT nasal spray Place 1 spray into both nostrils daily as needed for allergies  or rhinitis.  05/12/13  Yes [provider]  Fluticasone-Umeclidin-Vilant (TRELEGY ELLIPTA) 100-62.5-25 MCG/INH AEPB Inhale 1 puff into the lungs daily. 12/03/19  Yes Parrett, Tammy S, NP  gabapentin (NEURONTIN) 300 MG capsule Take two to three tablets at night as needed for restless leg symptoms Patient taking differently: Take 600-900 mg by mouth at bedtime as needed (for restless legs symptoms).  05/29/19  Yes [name], Alinda Sierras, MD  hydrocortisone (ANUSOL-HC) 2.5 % rectal cream Use nightly at bedtime for 10 days Patient taking differently: Place 1 application rectally at bedtime as needed (for anal irritation).  11/08/19  Yes Willia Craze, NP  insulin glargine, 1 Unit Dial, (TOUJEO SOLOSTAR) 300 UNIT/ML Solostar Pen INJECT 60  UNITS AS DIRECTED DAILY Patient taking differently: Inject 80 Units into the skin in the morning.  12/11/19  Yes [name], Alinda Sierras, MD  JANUVIA 100 MG tablet TAKE 1 TABLET(100 MG) BY MOUTH DAILY Patient taking differently: Take 100 mg by mouth daily.  11/22/19  Yes [name], Alinda Sierras, MD  JARDIANCE 25 MG TABS tablet TAKE 1 TABLET BY MOUTH DAILY Patient taking differently: Take 25 mg by mouth daily.  10/18/19  Yes [name], Alinda Sierras, MD  LORazepam (ATIVAN) 0.5 MG tablet TAKE 1 TABLET(0.5 MG) BY MOUTH EVERY 8 HOURS AS NEEDED FOR ANXIETY Patient taking differently: Take 0.5 mg by mouth every 8 (eight) hours as needed for anxiety.  01/07/20  Yes [name], Alinda Sierras, MD  losartan (COZAAR) 100 MG tablet Take 1 tablet (100 mg total) by mouth daily. 10/30/19 02/08/20 Yes [name], Alinda Sierras, MD  metoprolol tartrate (LOPRESSOR) 50 MG tablet Take 100 mg by mouth 2 (two) times daily.   Yes [provider]  potassium chloride SA (KLOR-CON) 20 MEQ tablet Take 2 tablets (40 mEq total) by mouth 2 (two) times daily. Patient taking differently: Take 20 mEq by mouth 2 (two) times a week.  10/27/19 02/08/20 Yes Kyle, Tyrone A, DO  RABEprazole (ACIPHEX) 20 MG tablet TAKE 1 TABLET(20 MG) BY  MOUTH DAILY Patient taking differently: Take 20 mg by mouth daily before breakfast.  12/12/19  Yes [name], Alinda Sierras, MD  rosuvastatin (CRESTOR) 40 MG tablet Take 1 tablet (40 mg total) by mouth daily. 06/27/19 10/24/28 Yes Turner, Eber Hong, MD  VASCEPA 1 g capsule TAKE 2 CAPSULES(2 GRAMS) BY MOUTH TWICE DAILY Patient taking differently: Take 2 g by mouth 2 (two) times daily.  11/29/19  Yes Martinique, [name], MD  DULoxetine (CYMBALTA) 30 MG capsule Take 1 capsule (30 mg total) by mouth daily. Patient not taking: Reported on 02/08/2020 01/23/20   [name], MD  glucose blood [facility] VERIO) test strip Test once daily.  Dx E11.9 01/26/18   [name], MD  metFORMIN (GLUCOPHAGE) 1000 MG tablet TAKE 1 TABLET BY MOUTH TWICE DAILY WITH A MEAL Patient not taking: No sig reported 04/30/19   [name], Alinda Sierras, MD  metoprolol tartrate (LOPRESSOR) 100 MG tablet TAKE 1 TABLET(100 MG) BY MOUTH TWICE DAILY Patient taking differently: Take 50 mg by mouth 2 (two) times daily.  09/21/19   [name], Alinda Sierras, MD  montelukast (SINGULAIR) 10 MG tablet TAKE 1 TABLET BY MOUTH AT BEDTIME Patient not taking: No sig reported 04/30/19   [name], MD    Physical Exam: Constitutional: Moderately built and nourished. Vitals:   02/08/20 2145 02/08/20 2200 02/08/20 2215 02/08/20 2230  BP: 137/85 127/84 (!) 143/96 136/86  Pulse: 82 71 (!) 173 (!) 59  Resp: (!) 28 (!) 27 (!) 26 (!) 24  Temp:      SpO2: 100% 100% 100% 99%   Eyes: Anicteric no pallor. ENMT: No discharge from the ears eyes nose or mouth. Neck: No mass felt.  No neck rigidity. Respiratory: No rhonchi or crepitations. Cardiovascular: S1-S2 heard. Abdomen: Soft nontender bowel sounds present. Musculoskeletal: No edema. Skin: Small laceration of the right forehead. Neurologic: Alert awake oriented to time place and person.  Moves all extremities. Psychiatric: Appears normal.  Normal affect.   Labs on Admission: I have personally  reviewed following labs and imaging studies  CBC: Recent Labs  Lab 02/08/20 2015 02/08/20 2043  WBC 11.7*  --   NEUTROABS 9.2*  --   HGB  15.1 15.6  HCT 46.5 46.0  MCV 93.0  --   PLT 296  --    Basic Metabolic Panel: Recent Labs  Lab 02/08/20 2015 02/08/20 2043  NA 137 139  K 2.1* 2.0*  CL 94*  --   CO2 14*  --   GLUCOSE 159*  --   BUN 13  --   CREATININE 1.19  --   CALCIUM 10.2  --    GFR: CrCl cannot be calculated (Unknown ideal weight.). Liver Function Tests: Recent Labs  Lab 02/08/20 2015  AST 39  ALT 29  ALKPHOS 80  BILITOT 1.7*  PROT 8.2*  ALBUMIN 4.1   Recent Labs  Lab 02/08/20 2015  LIPASE 61*   No results for input(s): AMMONIA in the last 168 hours. Coagulation Profile: No results for input(s): INR, PROTIME in the last 168 hours. Cardiac Enzymes: No results for input(s): CKTOTAL, CKMB, CKMBINDEX, TROPONINI in the last 168 hours. BNP (last 3 results) Recent Labs    12/03/19 1502  PROBNP 266.0*   HbA1C: No results for input(s): HGBA1C in the last 72 hours. CBG: Recent Labs  Lab 02/08/20 2001  GLUCAP 145*   Lipid Profile: No results for input(s): CHOL, HDL, LDLCALC, TRIG, CHOLHDL, LDLDIRECT in the last 72 hours. Thyroid Function Tests: No results for input(s): TSH, T4TOTAL, FREET4, T3FREE, THYROIDAB in the last 72 hours. Anemia Panel: No results for input(s): VITAMINB12, FOLATE, FERRITIN, TIBC, IRON, RETICCTPCT in the last 72 hours. Urine analysis:    Component Value Date/Time   COLORURINE YELLOW 02/08/2020 2026   APPEARANCEUR CLEAR 02/08/2020 2026   LABSPEC 1.021 02/08/2020 2026   PHURINE 5.0 02/08/2020 2026   GLUCOSEU >=500 (A) 02/08/2020 2026   HGBUR SMALL (A) 02/08/2020 2026   BILIRUBINUR NEGATIVE 02/08/2020 2026   BILIRUBINUR 1+ 08/14/2013 1200   KETONESUR 80 (A) 02/08/2020 2026   PROTEINUR 100 (A) 02/08/2020 2026   UROBILINOGEN 0.2 08/14/2013 1200   NITRITE NEGATIVE 02/08/2020 2026   LEUKOCYTESUR NEGATIVE 02/08/2020 2026    Sepsis Labs: @LABRCNTIP (procalcitonin:4,lacticidven:4) )No results found for this or any previous visit (from the past 240 hour(s)).   Radiological Exams on Admission: CT HEAD WO CONTRAST  Result Date: 02/08/2020 CLINICAL DATA:  Post-traumatic headache. EXAM: CT HEAD WITHOUT CONTRAST TECHNIQUE: Contiguous axial images were obtained from the base of the skull through the vertex without intravenous contrast. COMPARISON:  None. FINDINGS: Brain: No evidence of acute infarction, hemorrhage, hydrocephalus, extra-axial collection or mass lesion/mass effect. Advanced atrophy and chronic microvascular ischemic changes are noted. Vascular: No hyperdense vessel or unexpected calcification. Skull: Normal. Negative for fracture or focal lesion. Sinuses/Orbits: Mucosal thickening is noted of the left maxillary sinus. The remaining paranasal sinuses and mastoid air cells are essentially clear. Other: None. IMPRESSION: 1. No acute intracranial abnormality. 2. Advanced atrophy and chronic microvascular ischemic changes. Electronically Signed   By: Constance Holster M.D.   On: 02/08/2020 20:56   DG Chest Port 1 View  Result Date: 02/08/2020 CLINICAL DATA:  Pt brought to ED by family after fall in the bathroom. Pt reports he fell around 6 or 7 or 12 today. Pt states is has been on the floor the whole time but then states his wife and neighbor got him up after the fall. Dried blood noted to R eyebrow, bruising under R eye. Pt denies blood thinners. Pt is oriented to self, place, pt states he slipped on urine in the bathroom and hit head on commode EXAM: PORTABLE CHEST 1 VIEW COMPARISON:  12/03/2019  FINDINGS: Stable changes from prior cardiac surgery. Cardiac silhouette is normal in size. No mediastinal or hilar masses. Stable left anterior chest wall loop recorder. Clear lungs.  No pleural effusion or pneumothorax. Skeletal structures are grossly intact. IMPRESSION: No acute cardiopulmonary disease. Electronically Signed    By: Lajean Manes M.D.   On: 02/08/2020 20:58    EKG: Independently reviewed.  A. fib with RVR.  Assessment/Plan Principal Problem:   Atrial fibrillation with RVR (HCC) Active Problems:   CAD (coronary artery disease)   Alcohol dependence (HCC)   High anion gap metabolic acidosis    1. High anion gap metabolic acidosis likely from DKA with normal blood sugar with since patient is on Jardiance we will check hemoglobin A1c and once potassium is corrected and if questions anion gap is still elevated will start patient on IV insulin for DKA protocol.  Closely monitor metabolic panel.  Salicylate levels are pending.  Metabolic acidosis could also be from alcohol. 2. A. fib with RVR improved with IV metoprolol fluids and Tikosyn.  Closely monitor.  Check TSH.  Cardiac markers.  Not on any anticoagulation likely from risk of falls. 3. Alcohol withdrawal patient is placed on CIWA protocol.  Patient will consult. 4. CAD status post CABG denies any chest pain.  On aspirin metoprolol and statins. 5. COPD no active wheezes. 6. History of gout on allopurinol. 7. Small laceration after a fall in the right forehead no active bleed.  CT head is unremarkable. 8. Hypertension on Cozaar and metoprolol.  Since patient has possible DKA with A. fib with RVR morbidities will need close monitoring for any further worsening in inpatient status.   DVT prophylaxis: Lovenox. Code Status: Full code. Family Communication: No family at the bedside. Disposition Plan: Home. Consults called: None. Admission status: Inpatient.   [name] MD [facility] Pager [phone].  If 7PM-7AM, please contact night-coverage [email or web address] Password [facility]  02/08/2020, 10:46 PM

## 2020-02-08 NOTE — ED Notes (Addendum)
Dr. Maryan Rued notified of abnormal potassium on EG7

## 2020-02-08 NOTE — ED Triage Notes (Addendum)
Pt brought to ED by family after fall in the bathroom. Pt reports he fell around 6 or 7 or 12 today. Pt states is has been on the floor the whole time but then states his wife and neighbor got him up after the fall. Dried blood noted to R eyebrow, bruising under R eye. Pt denies blood thinners. Pt is oriented to self, place, pt states he slipped on urine in the bathroom and hit head on commode.

## 2020-02-08 NOTE — ED Provider Notes (Signed)
[facility] EMERGENCY DEPARTMENT Provider Note   CSN: [id number] Arrival date & time: 02/08/20  1943     History Chief Complaint  Patient presents with  . Fall    Adam [name] is a 69 y.o. male.  Patient is a 69 year old male with significant past medical history for COPD, CAD, diabetes, paroxysmal A. fib on Tikosyn and metoprolol also with a history of chronic alcohol use presenting today with multiple falls.  Patient reports that for the last 6 to 8 weeks he has had intermittent falls as well as increased urinary frequency and urgency.  Patient reports today he had 2 falls in the bathroom.  He states both times he slipped on urine.  The second time falling and hitting his head on the toilet.  He initially reported he was unable to get up but after 15 minutes was unable to get to his feet.  He denies any chest pain, shortness of breath or abdominal pain.  He does report some abdominal discomfort yesterday and 2 episodes of vomiting.  Patient does admit to drinking 4-5 scotch drinks per day but reported yesterday he drank some wine because he was feeling shaky.  He states when he does not have alcohol he does get the shakes but has no history of DTs or seizures.  He denies any headache at this time or neck pain.  His vision is unchanged.  He continues to take his diabetic medications.  The history is provided by the patient.  Fall This is a recurrent problem.       Past Medical History:  Diagnosis Date  . Allergy   . Anxiety    history of PTSD following CABG  . Ascending aortic aneurysm ([place]) 01/31/2018   43 x 42 mm, pt unaware  . Asthma   . Cardiomegaly 10/17/2017  . Colitis- colonoscopy 2014 August 11, 2015  . COPD (chronic obstructive pulmonary disease) ([place])   . Coronary artery disease    x 6  . Depression   . Diabetes mellitus without complication ([place])   . Family history of polyps in the colon   . Finger dislocation    Left pinkie  . GERD  (gastroesophageal reflux disease)   . Gout   . H/O atrial fibrillation without current medication    following CABG with no documented episodes since then.  . Heart palpitations   . Hx of adenomatous colonic polyps 08/12/2010  . Hyperlipidemia   . Hypertension   . OA (osteoarthritis)   . OSA (obstructive sleep apnea)    Mild, has not received CPAP yet  . Prediabetes   . RLS (restless legs syndrome)   . 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 Secondary hypercoagulable state ([place]) 11/02/2019  . Alcohol withdrawal ([place]) 10/24/2019  . Paroxysmal atrial fibrillation ([place]) 10/24/2019  . Rectal bleeding 10/24/2019  . Thoracic aortic aneurysm ([place]) 02/08/2019  . Hemoptysis 10/06/2018  . S/P right THA, 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  . S/P hip replacement 09/12/2018  . Sleep apnea 01/09/2018  . Persistent atrial fibrillation 12/12/2017  . Allergic rhinitis 08/22/2015  . Family history of colon cancer in mother deceased age 39 08-11-2015  . Type 2 diabetes mellitus, controlled ([place]) 07/17/2013  . COPD (chronic obstructive pulmonary disease) ([place]) 07/05/2013  . CAD (coronary artery disease) 06/26/2013  . Hypertension associated with diabetes ([place]) 06/26/2013  . Hyperlipidemia associated with type 2 diabetes mellitus ([place]) 06/26/2013  .  GERD (gastroesophageal reflux disease) 06/26/2013  . History of atrial fibrillation without current medication 06/26/2013  . Gout 06/26/2013  . Osteoarthritis 06/26/2013  . Asthma, mild persistent 06/26/2013  . Alcohol dependence ([place]) 06/26/2013  . PTSD (post-traumatic stress disorder) 06/26/2013  . HLA B27 (HLA B27 positive) 06/26/2013  . Obesity (BMI 30-39.9) 06/26/2013    Past Surgical History:  Procedure Laterality Date  . ANKLE FRACTURE SURGERY Right 1991  . APPENDECTOMY    . ATRIAL FIBRILLATION ABLATION N/A 03/31/2018   Procedure: ATRIAL FIBRILLATION ABLATION;  Surgeon: [name], MD;   Location: [facility];  Service: Cardiovascular;  Laterality: N/A;  . CARDIAC ELECTROPHYSIOLOGY MAPPING AND ABLATION    . COLONOSCOPY W/ BIOPSIES  2017   x7  . CORONARY ANGIOPLASTY WITH STENT PLACEMENT    . CORONARY ARTERY BYPASS GRAFT    . FINGER SURGERY  04/2018   Small finger left hand  . implantable loop recorder placement  07/02/2019   Medtronic Reveal [place] model [id number] ([place] [id number]) implanted in office by Dr Rayann Heman  . TEE WITHOUT CARDIOVERSION N/A 03/30/2018   Procedure: TRANSESOPHAGEAL ECHOCARDIOGRAM (TEE);  Surgeon: [name], MD;  Location: [facility];  Service: Cardiovascular;  Laterality: N/A;  . TOTAL HIP ARTHROPLASTY Left   . TOTAL HIP ARTHROPLASTY Right 09/12/2018   Procedure: TOTAL HIP ARTHROPLASTY ANTERIOR APPROACH;  Surgeon: [name], MD;  Location: [facility];  Service: Orthopedics;  Laterality: Right;  70 mins       Family History  Problem Relation Age of Onset  . Colon cancer Mother   . Cancer Father        UNKNOWN TYPE  . Heart disease Paternal Grandmother   . Heart disease Paternal Grandfather     Social History   Tobacco Use  . Smoking status: Former Smoker    Packs/day: 1.00    Years: 7.00    Pack years: 7.00    Types: Cigarettes    Quit date: 10/04/1977    Years since quitting: 42.3  . Smokeless tobacco: Never Used  . Tobacco comment: never smoked over 1 pack   Substance Use Topics  . Alcohol use: Yes    Alcohol/week: 3.0 - 4.0 standard drinks    Types: 1 Glasses of wine, 2 - 3 Shots of liquor per week    Comment: DAILY  . Drug use: No    Comment: Smoked Marijuana back in 1970s    Home Medications Prior to Admission medications   Medication Sig Start Date End Date Taking? Authorizing Provider  allopurinol (ZYLOPRIM) 300 MG tablet Take 1 tablet (300 mg total) by mouth daily. 09/24/19  Yes [name], Alinda Sierras, MD  Coenzyme Q10 (CO Q-10) 100 MG CAPS Take 100 mg by mouth daily.    Yes [provider]  dofetilide  (TIKOSYN) 125 MCG capsule Take 1 capsule (125 mcg total) by mouth 2 (two) times daily. May have 90 day refills if patient prefers Patient taking differently: Take 125 mcg by mouth 2 (two) times daily.  10/01/19  Yes Allred, Jeneen Rinks, MD  DULoxetine (CYMBALTA) 60 MG capsule Take 1 capsule (60 mg total) by mouth daily. 01/23/20  Yes [name], Alinda Sierras, MD  fenofibrate micronized (LOFIBRA) 200 MG capsule Take 1 capsule (200 mg total) by mouth daily before breakfast. 08/09/19  Yes Turner, Eber Hong, MD  fluticasone (FLONASE) 50 MCG/ACT nasal spray Place 1 spray into both nostrils daily as needed for allergies or rhinitis.  05/12/13  Yes [provider]  Fluticasone-Umeclidin-Vilant (TRELEGY ELLIPTA)  100-62.5-25 MCG/INH AEPB Inhale 1 puff into the lungs daily. 12/03/19  Yes Parrett, Tammy S, NP  gabapentin (NEURONTIN) 300 MG capsule Take two to three tablets at night as needed for restless leg symptoms Patient taking differently: Take 600-900 mg by mouth at bedtime as needed (for restless legs symptoms).  05/29/19  Yes [name], Alinda Sierras, MD  hydrocortisone (ANUSOL-HC) 2.5 % rectal cream Use nightly at bedtime for 10 days Patient taking differently: Place 1 application rectally at bedtime as needed (for anal irritation).  11/08/19  Yes Willia Craze, NP  insulin glargine, 1 Unit Dial, (TOUJEO SOLOSTAR) 300 UNIT/ML Solostar Pen INJECT 60 UNITS AS DIRECTED DAILY Patient taking differently: Inject 80 Units into the skin in the morning.  12/11/19  Yes [name], Alinda Sierras, MD  JANUVIA 100 MG tablet TAKE 1 TABLET(100 MG) BY MOUTH DAILY Patient taking differently: Take 100 mg by mouth daily.  11/22/19  Yes [name], Alinda Sierras, MD  JARDIANCE 25 MG TABS tablet TAKE 1 TABLET BY MOUTH DAILY Patient taking differently: Take 25 mg by mouth daily.  10/18/19  Yes [name], Alinda Sierras, MD  LORazepam (ATIVAN) 0.5 MG tablet TAKE 1 TABLET(0.5 MG) BY MOUTH EVERY 8 HOURS AS NEEDED FOR ANXIETY Patient taking differently: Take 0.5 mg  by mouth every 8 (eight) hours as needed for anxiety.  01/07/20  Yes [name], Alinda Sierras, MD  losartan (COZAAR) 100 MG tablet Take 1 tablet (100 mg total) by mouth daily. 10/30/19 02/08/20 Yes [name], Alinda Sierras, MD  metoprolol tartrate (LOPRESSOR) 50 MG tablet Take 100 mg by mouth 2 (two) times daily.   Yes [provider]  potassium chloride SA (KLOR-CON) 20 MEQ tablet Take 2 tablets (40 mEq total) by mouth 2 (two) times daily. Patient taking differently: Take 20 mEq by mouth 2 (two) times a week.  10/27/19 02/08/20 Yes Kyle, Tyrone A, DO  RABEprazole (ACIPHEX) 20 MG tablet TAKE 1 TABLET(20 MG) BY MOUTH DAILY Patient taking differently: Take 20 mg by mouth daily before breakfast.  12/12/19  Yes [name], Alinda Sierras, MD  rosuvastatin (CRESTOR) 40 MG tablet Take 1 tablet (40 mg total) by mouth daily. 06/27/19 10/24/28 Yes Turner, Eber Hong, MD  VASCEPA 1 g capsule TAKE 2 CAPSULES(2 GRAMS) BY MOUTH TWICE DAILY Patient taking differently: Take 2 g by mouth 2 (two) times daily.  11/29/19  Yes [place]ique, [name], MD  DULoxetine (CYMBALTA) 30 MG capsule Take 1 capsule (30 mg total) by mouth daily. Patient not taking: Reported on 02/08/2020 01/23/20   [name], MD  glucose blood [facility] VERIO) test strip Test once daily.  Dx E11.9 01/26/18   [name], MD  metFORMIN (GLUCOPHAGE) 1000 MG tablet TAKE 1 TABLET BY MOUTH TWICE DAILY WITH A MEAL Patient not taking: No sig reported 04/30/19   [name], Alinda Sierras, MD  metoprolol tartrate (LOPRESSOR) 100 MG tablet TAKE 1 TABLET(100 MG) BY MOUTH TWICE DAILY Patient taking differently: Take 50 mg by mouth 2 (two) times daily.  09/21/19   [name], Alinda Sierras, MD  montelukast (SINGULAIR) 10 MG tablet TAKE 1 TABLET BY MOUTH AT BEDTIME Patient not taking: No sig reported 04/30/19   [name], MD    Allergies    Xarelto [rivaroxaban], Amoxicillin, Augmentin [amoxicillin-pot clavulanate], Azithromycin, Clindamycin/lincomycin, and Keflex  [cephalexin]  Review of Systems   Review of Systems  All other systems reviewed and are negative.   Physical Exam Updated Vital Signs BP 140/82   Pulse 61   Temp (!) 97  F (36.1 C)   Resp (!) 23   SpO2 100%   Physical Exam Vitals and nursing note reviewed.  Constitutional:      General: He is not in acute distress.    Appearance: He is well-developed. He is obese.     Comments: Patient smells of ketones  HENT:     Head: Normocephalic. Contusion, right periorbital erythema and laceration present.   Eyes:     Conjunctiva/sclera: Conjunctivae normal.     Pupils: Pupils are equal, round, and reactive to light.  Cardiovascular:     Rate and Rhythm: Tachycardia present. Rhythm irregular.     Pulses: Normal pulses.     Heart sounds: No murmur.  Pulmonary:     Effort: Pulmonary effort is normal. Tachypnea present. No respiratory distress.     Breath sounds: Normal breath sounds. No wheezing or rales.  Abdominal:     General: There is no distension.     Palpations: Abdomen is soft.     Tenderness: There is no abdominal tenderness. There is no guarding or rebound.  Musculoskeletal:        General: No tenderness. Normal range of motion.     Cervical back: Normal range of motion and neck supple.     Right lower leg: No edema.     Left lower leg: No edema.  Skin:    General: Skin is warm and dry.     Findings: No erythema or rash.  Neurological:     Mental Status: He is alert and oriented to person, place, and time.     Sensory: No sensory deficit.     Motor: No weakness.     Comments: Patient does not appear confused at this time and seems lucid.  Able to answer all questions appropriately.  Psychiatric:        Mood and Affect: Mood normal.        Behavior: Behavior normal.        Thought Content: Thought content normal.     ED Results / Procedures / Treatments   Labs (all labs ordered are listed, but only abnormal results are displayed) Labs Reviewed  CBC WITH  DIFFERENTIAL/PLATELET - Abnormal; Notable for the following components:      Result Value   WBC 11.7 (*)    Neutro Abs 9.2 (*)    Monocytes Absolute 1.3 (*)    Abs Immature Granulocytes 0.18 (*)    All other components within normal limits  COMPREHENSIVE METABOLIC PANEL - Abnormal; Notable for the following components:   Potassium 2.1 (*)    Chloride 94 (*)    CO2 14 (*)    Glucose, Bld 159 (*)    Total Protein 8.2 (*)    Total Bilirubin 1.7 (*)    Anion gap 29 (*)    All other components within normal limits  LIPASE, BLOOD - Abnormal; Notable for the following components:   Lipase 61 (*)    All other components within normal limits  URINALYSIS, ROUTINE W REFLEX MICROSCOPIC - Abnormal; Notable for the following components:   Glucose, UA >=500 (*)    Hgb urine dipstick SMALL (*)    Ketones, ur 80 (*)    Protein, ur 100 (*)    All other components within normal limits  LACTIC ACID, PLASMA - Abnormal; Notable for the following components:   Lactic Acid, Venous 2.3 (*)    All other components within normal limits  CBC - Abnormal; Notable for the following components:  WBC 13.4 (*)    RBC 4.14 (*)    Hemoglobin 12.6 (*)    HCT 38.8 (*)    All other components within normal limits  CBG MONITORING, ED - Abnormal; Notable for the following components:   Glucose-Capillary 145 (*)    All other components within normal limits  POCT I-STAT EG7 - Abnormal; Notable for the following components:   pCO2, Ven 28.3 (*)    pO2, Ven 83.0 (*)    Bicarbonate 15.4 (*)    TCO2 16 (*)    Acid-base deficit 9.0 (*)    Potassium 2.0 (*)    All other components within normal limits  RESPIRATORY PANEL BY RT PCR (FLU A&B, COVID)  BETA-HYDROXYBUTYRIC ACID  TSH  MAGNESIUM  I-STAT VENOUS BLOOD GAS, ED  TROPONIN I (HIGH SENSITIVITY)    EKG EKG Interpretation  Date/Time:  Friday Feb 08 2020 20:11:19 EDT Ventricular Rate:  170 PR Interval:    QRS Duration: 84 QT Interval:  277 QTC  Calculation: 466 R Axis:   48 Text Interpretation: recurrent Atrial fibrillation with rapid ventricular response Repolarization abnormality, prob rate related Confirmed by Blanchie Dessert [phone]) on 02/08/2020 8:24:37 PM   Radiology CT HEAD WO CONTRAST  Result Date: 02/08/2020 CLINICAL DATA:  Post-traumatic headache. EXAM: CT HEAD WITHOUT CONTRAST TECHNIQUE: Contiguous axial images were obtained from the base of the skull through the vertex without intravenous contrast. COMPARISON:  None. FINDINGS: Brain: No evidence of acute infarction, hemorrhage, hydrocephalus, extra-axial collection or mass lesion/mass effect. Advanced atrophy and chronic microvascular ischemic changes are noted. Vascular: No hyperdense vessel or unexpected calcification. Skull: Normal. Negative for fracture or focal lesion. Sinuses/Orbits: Mucosal thickening is noted of the left maxillary sinus. The remaining paranasal sinuses and mastoid air cells are essentially clear. Other: None. IMPRESSION: 1. No acute intracranial abnormality. 2. Advanced atrophy and chronic microvascular ischemic changes. Electronically Signed   By: Constance Holster M.D.   On: 02/08/2020 20:56   DG Chest Port 1 View  Result Date: 02/08/2020 CLINICAL DATA:  Pt brought to ED by family after fall in the bathroom. Pt reports he fell around 6 or 7 or 12 today. Pt states is has been on the floor the whole time but then states his wife and neighbor got him up after the fall. Dried blood noted to R eyebrow, bruising under R eye. Pt denies blood thinners. Pt is oriented to self, place, pt states he slipped on urine in the bathroom and hit head on commode EXAM: PORTABLE CHEST 1 VIEW COMPARISON:  12/03/2019 FINDINGS: Stable changes from prior cardiac surgery. Cardiac silhouette is normal in size. No mediastinal or hilar masses. Stable left anterior chest wall loop recorder. Clear lungs.  No pleural effusion or pneumothorax. Skeletal structures are grossly intact.  IMPRESSION: No acute cardiopulmonary disease. Electronically Signed   By: Lajean Manes M.D.   On: 02/08/2020 20:58    Procedures Procedures (including critical care time)  Medications Ordered in ED Medications  potassium chloride 10 mEq in 100 mL IVPB (10 mEq Intravenous New Bag/Given 02/08/20 2202)  metoprolol tartrate (LOPRESSOR) injection 5 mg (has no administration in time range)  lactated ringers bolus 1,000 mL (1,000 mLs Intravenous New Bag/Given 02/08/20 2028)  lactated ringers bolus 1,000 mL (0 mLs Intravenous Stopped 02/08/20 2207)    ED Course  I have reviewed the triage vital signs and the nursing notes.  Pertinent labs & imaging results that were available during my care of the patient were reviewed by me  and considered in my medical decision making (see chart for details).    MDM Rules/Calculators/A&P                      Elderly male presenting today after having multiple falls at home.  Patient here is noted to be tachycardic, hypotensive but awake and alert.  He was given 1 L bolus of fluid with improvement in blood pressure.  Heart rate remains elevated between 1 50-1 70 with EKG most consistent with A. fib RVR.  Patient is not currently anticoagulated.  Patient also smells of ketones on exam.  He also has significant history of excessive alcohol use.  Given patient's symptoms and story concern for alcoholic ketosis versus normoglycemic DKA as he is on newer therapies that could be suspect.  Patient has no evidence of UTI today but does have greater than 80 ketones and elevated sugar.  Patient's VBG shows a normal pH but bicarb of 15.  CMP with a potassium of 2.1, CO2 of 14 and anion gap of 29.  Patient's LFTs are within normal limits.  Magnesium level pending.  Patient given 5 runs of potassium and a second liter of IV fluids.  Lactate is pending.  Patient has no evidence for infectious etiology at this time.  Because after initial fluid bolus heart rate has not changed at all we  will give an IV dose of Lopressor for heart rate control.  Chest x-ray without acute findings and head CT negative for intracranial hemorrhage. Feel that patient will most likely need to start on an insulin drip to decrease the gap but also needs to start on D50 as well.  11:21 PM After Lopressor patient's heart rate improved to 110.  Lactic acid only 2.3.  Beta hydroxybutyrate is pending.  Patient is receiving his second liter of fluid.  Concern for normoglycemic DKA.  Discussed with the hospitalist.  Patient will most likely need to be started on an insulin drip.  CRITICAL CARE Performed by: Yuya Vanwingerden Total critical care time: 30 minutes Critical care time was exclusive of separately billable procedures and treating other patients. Critical care was necessary to treat or prevent imminent or life-threatening deterioration. Critical care was time spent personally by me on the following activities: development of treatment plan with patient and/or surrogate as well as nursing, discussions with consultants, evaluation of patient's response to treatment, examination of patient, obtaining history from patient or surrogate, ordering and performing treatments and interventions, ordering and review of laboratory studies, ordering and review of radiographic studies, pulse oximetry and re-evaluation of patient's condition.  MDM Number of Diagnoses or Management Options   Amount and/or Complexity of Data Reviewed Clinical lab tests: ordered and reviewed Tests in the radiology section of CPT: ordered and reviewed Tests in the medicine section of CPT: ordered and reviewed Decide to obtain previous medical records or to obtain history from someone other than the patient: yes Obtain history from someone other than the patient: yes Review and summarize past medical records: yes Discuss the patient with other providers: yes Independent visualization of images, tracings, or specimens: yes  Risk of  Complications, Morbidity, and/or Mortality Presenting problems: high Diagnostic procedures: moderate Management options: moderate  Critical Care Total time providing critical care: 30-74 minutes  Patient Progress Patient progress: improved    Final Clinical Impression(s) / ED Diagnoses Final diagnoses:  Atrial fibrillation with RVR ([place])  Diabetic ketoacidosis without coma associated with type 2 diabetes mellitus ([place])  Contusion of  face, initial encounter    Rx / DC Orders ED Discharge Orders    None       Blanchie Dessert, MD 02/08/20 2329

## 2020-02-09 ENCOUNTER — Other Ambulatory Visit: Payer: Self-pay

## 2020-02-09 DIAGNOSIS — F1020 Alcohol dependence, uncomplicated: Secondary | ICD-10-CM

## 2020-02-09 DIAGNOSIS — E1110 Type 2 diabetes mellitus with ketoacidosis without coma: Principal | ICD-10-CM

## 2020-02-09 LAB — BASIC METABOLIC PANEL
Anion gap: 22 — ABNORMAL HIGH (ref 5–15)
Anion gap: 17 — ABNORMAL HIGH (ref 5–15)
BUN: 10 mg/dL (ref 8–23)
BUN: 10 mg/dL (ref 8–23)
CO2: 18 mmol/L — ABNORMAL LOW (ref 22–32)
CO2: 20 mmol/L — ABNORMAL LOW (ref 22–32)
Calcium: 9.4 mg/dL (ref 8.9–10.3)
Calcium: 9.5 mg/dL (ref 8.9–10.3)
Chloride: 98 mmol/L (ref 98–111)
Chloride: 102 mmol/L (ref 98–111)
Creatinine, Ser: 1.16 mg/dL (ref 0.61–1.24)
Creatinine, Ser: 1.12 mg/dL (ref 0.61–1.24)
GFR calc Af Amer: >60 mL/min (ref >60)
GFR calc Af Amer: >60 mL/min (ref >60)
GFR calc non Af Amer: >60 mL/min (ref >60)
GFR calc non Af Amer: >60 mL/min (ref >60)
Glucose, Bld: 174 mg/dL — ABNORMAL HIGH (ref 70–99)
Glucose, Bld: 144 mg/dL — ABNORMAL HIGH (ref 70–99)
Potassium: 2.7 mmol/L — CL (ref 3.5–5.1)
Potassium: 4 mmol/L (ref 3.5–5.1)
Sodium: 138 mmol/L (ref 135–145)
Sodium: 139 mmol/L (ref 135–145)

## 2020-02-09 LAB — LACTIC ACID, PLASMA
Lactic Acid, Venous: 1.3 mmol/L (ref 0.5–1.9)
Lactic Acid, Venous: 1.9 mmol/L (ref 0.5–1.9)

## 2020-02-09 LAB — SALICYLATE LEVEL: Salicylate Lvl: <7 mg/dL — ABNORMAL LOW (ref 7.0–30.0)

## 2020-02-09 LAB — COMPREHENSIVE METABOLIC PANEL
ALT: 25 U/L (ref 0–44)
AST: 31 U/L (ref 15–41)
Albumin: 3.3 g/dL — ABNORMAL LOW (ref 3.5–5.0)
Alkaline Phosphatase: 63 U/L (ref 38–126)
Anion gap: 17 — ABNORMAL HIGH (ref 5–15)
BUN: 11 mg/dL (ref 8–23)
CO2: 22 mmol/L (ref 22–32)
Calcium: 9.6 mg/dL (ref 8.9–10.3)
Chloride: 100 mmol/L (ref 98–111)
Creatinine, Ser: 0.94 mg/dL (ref 0.61–1.24)
GFR calc Af Amer: >60 mL/min (ref >60)
GFR calc non Af Amer: >60 mL/min (ref >60)
Glucose, Bld: 174 mg/dL — ABNORMAL HIGH (ref 70–99)
Potassium: 3.9 mmol/L (ref 3.5–5.1)
Sodium: 139 mmol/L (ref 135–145)
Total Bilirubin: 1.6 mg/dL — ABNORMAL HIGH (ref 0.3–1.2)
Total Protein: 6 g/dL — ABNORMAL LOW (ref 6.5–8.1)

## 2020-02-09 LAB — CBC
HCT: 40 % (ref 39.0–52.0)
Hemoglobin: 12.9 g/dL — ABNORMAL LOW (ref 13.0–17.0)
MCH: 29.9 pg (ref 26.0–34.0)
MCHC: 32.3 g/dL (ref 30.0–36.0)
MCV: 92.8 fL (ref 80.0–100.0)
Platelets: 232 10*3/uL (ref 150–400)
RBC: 4.31 MIL/uL (ref 4.22–5.81)
RDW: 14.5 % (ref 11.5–15.5)
WBC: 7.8 10*3/uL (ref 4.0–10.5)
nRBC: 0 % (ref 0.0–0.2)

## 2020-02-09 LAB — VITAMIN B12: Vitamin B-12: 353 pg/mL (ref 180–914)

## 2020-02-09 LAB — MAGNESIUM: Magnesium: 1.8 mg/dL (ref 1.7–2.4)

## 2020-02-09 LAB — TROPONIN I (HIGH SENSITIVITY): Troponin I (High Sensitivity): 24 ng/L — ABNORMAL HIGH (ref <18)

## 2020-02-09 LAB — PHOSPHORUS: Phosphorus: 1.4 mg/dL — ABNORMAL LOW (ref 2.5–4.6)

## 2020-02-09 MED ORDER — ALUM & MAG HYDROXIDE-SIMETH 200-200-20 MG/5ML PO SUSP
15.0000 mL | ORAL | Status: DC | PRN
  Administered 2020-02-09: 15 mL via ORAL
  Filled 2020-02-09: qty 30

## 2020-02-09 MED ORDER — LORAZEPAM 1 MG PO TABS
0.0000 mg | ORAL_TABLET | Freq: Four times a day (QID) | ORAL | Status: DC
Start: 2020-02-09 — End: 2020-02-09

## 2020-02-09 MED ORDER — METOPROLOL TARTRATE 5 MG/5ML IV SOLN
5.0000 mg | Freq: Four times a day (QID) | INTRAVENOUS | Status: DC | PRN
  Administered 2020-02-11 – 2020-02-13 (×4): 5 mg via INTRAVENOUS
  Filled 2020-02-09 (×6): qty 5

## 2020-02-09 MED ORDER — POTASSIUM CHLORIDE CRYS ER 20 MEQ PO TBCR
40.0000 meq | EXTENDED_RELEASE_TABLET | Freq: Once | ORAL | Status: AC
  Administered 2020-02-09: 40 meq via ORAL
  Filled 2020-02-09: qty 2

## 2020-02-09 MED ORDER — ADULT MULTIVITAMIN W/MINERALS CH
1.0000 | ORAL_TABLET | Freq: Every day | ORAL | Status: DC
  Administered 2020-02-10 – 2020-02-18 (×7): 1 via ORAL
  Filled 2020-02-09 (×8): qty 1

## 2020-02-09 MED ORDER — POTASSIUM CHLORIDE CRYS ER 20 MEQ PO TBCR
80.0000 meq | EXTENDED_RELEASE_TABLET | Freq: Once | ORAL | Status: AC
  Administered 2020-02-09: 80 meq via ORAL
  Filled 2020-02-09: qty 4

## 2020-02-09 MED ORDER — THIAMINE HCL 100 MG/ML IJ SOLN: 100.0000 mg | Freq: Every day | INTRAMUSCULAR | Status: DC

## 2020-02-09 MED ORDER — POTASSIUM CHLORIDE CRYS ER 20 MEQ PO TBCR
80.0000 meq | EXTENDED_RELEASE_TABLET | Freq: Three times a day (TID) | ORAL | Status: DC
  Administered 2020-02-09: 80 meq via ORAL
  Filled 2020-02-09: qty 4

## 2020-02-09 MED ORDER — LORAZEPAM 1 MG PO TABS: 1.0000 mg | ORAL_TABLET | ORAL | Status: AC | PRN

## 2020-02-09 MED ORDER — POTASSIUM CHLORIDE 10 MEQ/100ML IV SOLN
10.0000 meq | INTRAVENOUS | Status: AC
  Administered 2020-02-09 (×3): 10 meq via INTRAVENOUS
  Filled 2020-02-09 (×2): qty 100

## 2020-02-09 MED ORDER — THIAMINE HCL 100 MG/ML IJ SOLN
100.0000 mg | Freq: Every day | INTRAMUSCULAR | Status: DC
Start: 2020-02-09 — End: 2020-02-09

## 2020-02-09 MED ORDER — FOLIC ACID 1 MG PO TABS
1.0000 mg | ORAL_TABLET | Freq: Every day | ORAL | Status: DC
  Administered 2020-02-10 – 2020-02-18 (×7): 1 mg via ORAL
  Filled 2020-02-09 (×8): qty 1

## 2020-02-09 MED ORDER — LORAZEPAM 2 MG/ML IJ SOLN: 1.0000 mg | INTRAMUSCULAR | Status: DC | PRN

## 2020-02-09 MED ORDER — METOPROLOL TARTRATE 5 MG/5ML IV SOLN
5.0000 mg | Freq: Once | INTRAVENOUS | Status: AC
  Administered 2020-02-09: 5 mg via INTRAVENOUS
  Filled 2020-02-09: qty 5

## 2020-02-09 MED ORDER — THIAMINE HCL 100 MG PO TABS
100.0000 mg | ORAL_TABLET | Freq: Every day | ORAL | Status: DC
Start: 2020-02-09 — End: 2020-02-09

## 2020-02-09 MED ORDER — LORAZEPAM 2 MG/ML IJ SOLN
0.0000 mg | Freq: Four times a day (QID) | INTRAMUSCULAR | Status: AC
  Administered 2020-02-09: 1 mg via INTRAVENOUS
  Administered 2020-02-09: 2 mg via INTRAVENOUS
  Administered 2020-02-11: 1 mg via INTRAVENOUS
  Filled 2020-02-09: qty 1
  Filled 2020-02-09: qty 2
  Filled 2020-02-09: qty 1

## 2020-02-09 MED ORDER — FOLIC ACID 1 MG PO TABS
1.0000 mg | ORAL_TABLET | Freq: Every day | ORAL | Status: DC
  Administered 2020-02-09: 1 mg via ORAL
  Filled 2020-02-09: qty 1

## 2020-02-09 MED ORDER — PANTOPRAZOLE SODIUM 40 MG PO TBEC
40.0000 mg | DELAYED_RELEASE_TABLET | Freq: Two times a day (BID) | ORAL | Status: DC
  Administered 2020-02-09 – 2020-02-18 (×17): 40 mg via ORAL
  Filled 2020-02-09 (×19): qty 1

## 2020-02-09 MED ORDER — LORAZEPAM 1 MG PO TABS
0.0000 mg | ORAL_TABLET | Freq: Two times a day (BID) | ORAL | Status: DC
Start: 2020-02-11 — End: 2020-02-09

## 2020-02-09 MED ORDER — FOLIC ACID 1 MG PO TABS: 1.0000 mg | ORAL_TABLET | Freq: Every day | ORAL | Status: DC

## 2020-02-09 MED ORDER — THIAMINE HCL 100 MG PO TABS: 100.0000 mg | ORAL_TABLET | Freq: Every day | ORAL | Status: DC

## 2020-02-09 MED ORDER — LORAZEPAM 2 MG/ML IJ SOLN
1.0000 mg | INTRAMUSCULAR | Status: AC | PRN
  Administered 2020-02-11 – 2020-02-12 (×2): 1 mg via INTRAVENOUS
  Filled 2020-02-09: qty 1

## 2020-02-09 MED ORDER — ADULT MULTIVITAMIN W/MINERALS CH
1.0000 | ORAL_TABLET | Freq: Every day | ORAL | Status: DC
  Administered 2020-02-09: 1 via ORAL
  Filled 2020-02-09: qty 1

## 2020-02-09 MED ORDER — LORAZEPAM 2 MG/ML IJ SOLN
0.0000 mg | INTRAMUSCULAR | Status: DC
  Filled 2020-02-09: qty 2

## 2020-02-09 MED ORDER — SODIUM CHLORIDE 0.9 % IV SOLN: INTRAVENOUS | Status: AC

## 2020-02-09 MED ORDER — LORAZEPAM 2 MG/ML IJ SOLN
0.0000 mg | Freq: Two times a day (BID) | INTRAMUSCULAR | Status: AC
  Administered 2020-02-12: 4 mg via INTRAVENOUS
  Filled 2020-02-09: qty 1
  Filled 2020-02-09: qty 2

## 2020-02-09 MED ORDER — INSULIN DETEMIR 100 UNIT/ML ~~LOC~~ SOLN
5.0000 [IU] | Freq: Every day | SUBCUTANEOUS | Status: DC
  Administered 2020-02-09 – 2020-02-11 (×3): 5 [IU] via SUBCUTANEOUS
  Filled 2020-02-09 (×4): qty 0.05

## 2020-02-09 MED ORDER — THIAMINE HCL 100 MG/ML IJ SOLN
500.0000 mg | Freq: Every day | INTRAVENOUS | Status: AC
  Administered 2020-02-09 – 2020-02-11 (×3): 500 mg via INTRAVENOUS
  Filled 2020-02-09 (×3): qty 5

## 2020-02-09 MED ORDER — MAGNESIUM SULFATE 2 GM/50ML IV SOLN
2.0000 g | Freq: Once | INTRAVENOUS | Status: AC
  Administered 2020-02-09: 2 g via INTRAVENOUS
  Filled 2020-02-09: qty 50

## 2020-02-09 MED ORDER — ASPIRIN 81 MG PO CHEW
81.0000 mg | CHEWABLE_TABLET | Freq: Every day | ORAL | Status: DC
  Administered 2020-02-09 – 2020-02-18 (×9): 81 mg via ORAL
  Filled 2020-02-09 (×9): qty 1

## 2020-02-09 MED ORDER — LORAZEPAM 2 MG/ML IJ SOLN: 0.0000 mg | Freq: Three times a day (TID) | INTRAMUSCULAR | Status: DC

## 2020-02-09 MED ORDER — THIAMINE HCL 100 MG PO TABS
100.0000 mg | ORAL_TABLET | Freq: Every day | ORAL | Status: DC
  Administered 2020-02-10 – 2020-02-18 (×6): 100 mg via ORAL
  Filled 2020-02-09 (×7): qty 1

## 2020-02-09 MED ORDER — LORAZEPAM 1 MG PO TABS: 1.0000 mg | ORAL_TABLET | ORAL | Status: DC | PRN

## 2020-02-09 MED ORDER — POTASSIUM CHLORIDE CRYS ER 20 MEQ PO TBCR
40.0000 meq | EXTENDED_RELEASE_TABLET | ORAL | Status: AC
  Administered 2020-02-09 (×2): 40 meq via ORAL
  Filled 2020-02-09 (×2): qty 2

## 2020-02-09 MED ORDER — ADULT MULTIVITAMIN W/MINERALS CH
1.0000 | ORAL_TABLET | Freq: Every day | ORAL | Status: DC
Start: 2020-02-09 — End: 2020-02-09

## 2020-02-09 MED ORDER — ENSURE ENLIVE PO LIQD
237.0000 mL | Freq: Two times a day (BID) | ORAL | Status: DC
  Administered 2020-02-09 – 2020-02-13 (×8): 237 mL via ORAL

## 2020-02-09 MED ORDER — THIAMINE HCL 100 MG/ML IJ SOLN
100.0000 mg | Freq: Every day | INTRAMUSCULAR | Status: DC
  Administered 2020-02-12 – 2020-02-15 (×3): 100 mg via INTRAVENOUS
  Filled 2020-02-09 (×3): qty 2

## 2020-02-09 MED ORDER — THIAMINE HCL 100 MG PO TABS
100.0000 mg | ORAL_TABLET | Freq: Every day | ORAL | Status: DC
  Administered 2020-02-09: 100 mg via ORAL
  Filled 2020-02-09: qty 1

## 2020-02-09 NOTE — Progress Notes (Signed)
   02/09/20 0028  Assess: MEWS Score  ECG Heart Rate (!) 157 (Matt rn aware)  Assess: MEWS Score  MEWS Temp 0  MEWS Systolic 0  MEWS Pulse 3  MEWS RR 1  MEWS LOC 0  MEWS Score 4  MEWS Score Color Red  This Charge RN notified Dr Myna Hidalgo of vitals. Order received for 5mg  metoprolol iv. Order initiated. Primary RN Rodman Key made aware.

## 2020-02-09 NOTE — Evaluation (Signed)
Physical Therapy Evaluation Patient Details Name: Adam [name] MRN: [id number] DOB: 08/29/1951 Today's Date: 02/09/2020   History of Present Illness  69 y.o. male with history of diabetes mellitus type 2, CAD status post CABG, A. fib not on anticoagulation probably from risk of falls with history of hypertension alcohol abuse presents to the ER the patient had a fall at home. Pt with frequent falls recently and drinking every day. Pt in afib with RVR and DKA in ED. CIWA protocol initiated.  Clinical Impression  Pt presents to PT with deficits in functional mobility, gait, balance, endurance, motor control and coordination, cognition, and safety awareness. Pt with shuffling and unsteady gait, without LOB but needing UE support and minA to steady at times. Pt also requiring physical assistance to perform bed mobility. Pt with slowed processing during session and reduced awareness of deficits. Pt will benefit from aggressive mobilization and PT POC to improve stability and reduce falls risk. PT anticipates pt will progress to being able to discharge home with HHPT and initial 24/7 assistance from family. PT will assess gait with cane vs RW next session and prior to discharge to determine optimum assistive device to improve gait quality.    Follow Up Recommendations Home health PT;Supervision/Assistance - 24 hour(24/7 initially)    Equipment Recommendations  3in1 (PT)(pt owns cane and RW)    Recommendations for Other Services       Precautions / Restrictions Precautions Precautions: Fall Precaution Comments: CIWA Restrictions Weight Bearing Restrictions: No      Mobility  Bed Mobility Overal bed mobility: Needs Assistance Bed Mobility: Supine to Sit     Supine to sit: Min assist     General bed mobility comments: pt requires support to get trunk upright  Transfers Overall transfer level: Needs assistance Equipment used: None Transfers: Sit to/from Stand Sit to Stand: Min  guard         General transfer comment: standing from bed, recliner, and commode  Ambulation/Gait Ambulation/Gait assistance: Min assist Gait Distance (Feet): 15 Feet(15' x 2, 3') Assistive device: IV Pole Gait Pattern/deviations: Wide base of support;Shuffle Gait velocity: reduced Gait velocity interpretation: <1.8 ft/sec, indicate of risk for recurrent falls General Gait Details: pt with short shuffling steps, increased lateral sway, Pt requires tactile and verbal cues for direction during session and to manage lines  Stairs            Wheelchair Mobility    Modified Rankin (Stroke Patients Only)       Balance Overall balance assessment: Needs assistance Sitting-balance support: Feet supported;Single extremity supported Sitting balance-Leahy Scale: Fair Sitting balance - Comments: close supervision at edge of bed   Standing balance support: Single extremity supported Standing balance-Leahy Scale: Fair Standing balance comment: minG with unilateral UE support of IV pole, static balance only                             Pertinent Vitals/Pain Pain Assessment: Faces Faces Pain Scale: Hurts little more Pain Location: abdomen- attempting bowel movement Pain Descriptors / Indicators: Cramping Pain Intervention(s): Monitored during session    Home Living Family/patient expects to be discharged to:: Private residence Living Arrangements: Spouse/significant other Available Help at Discharge: Family;Available PRN/intermittently(spouse works during day) Type of Home: House Home Access: Stairs to enter Entrance Stairs-Rails: Can reach both Technical brewer of Steps: 4 Home Layout: One level Home Equipment: Environmental consultant - 2 wheels;Cane - single point      Prior  Function Level of Independence: Independent         Comments: 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, newspaper        Extremity/Trunk Assessment   Upper Extremity Assessment Upper  Extremity Assessment: RUE deficits/detail RUE Deficits / Details: PT notes resting tremor in R hand while pt sitting on commode    Lower Extremity Assessment Lower Extremity Assessment: Overall WFL for tasks assessed    Cervical / Trunk Assessment Cervical / Trunk Assessment: Kyphotic  Communication   Communication: (dysarthric)  Cognition Arousal/Alertness: Awake/alert Behavior During Therapy: Flat affect Overall Cognitive Status: Impaired/Different from baseline Area of Impairment: Awareness;Problem solving                           Awareness: Intellectual Problem Solving: Slow processing;Requires verbal cues        General Comments General comments (skin integrity, edema, etc.): VSS, pt on room air    Exercises     Assessment/Plan    PT Assessment Patient needs continued PT services  PT Problem List Decreased strength;Decreased activity tolerance;Decreased balance;Decreased mobility;Decreased coordination;Decreased cognition;Decreased safety awareness;Decreased knowledge of precautions;Decreased knowledge of use of DME       PT Treatment Interventions DME instruction;Gait training;Stair training;Functional mobility training;Therapeutic activities;Therapeutic exercise;Balance training;Neuromuscular re-education;Patient/family education;Cognitive remediation    PT Goals (Current goals can be found in the Care Plan section)  Acute Rehab PT Goals Patient Stated Goal: To improve balance and reduce falls risk PT Goal Formulation: With patient Time For Goal Achievement: 02/23/20 Potential to Achieve Goals: Good Additional Goals Additional Goal #1: Pt will score >41/56 on BERG balance test to indicate a low falls risk.    Frequency Min 3X/week   Barriers to discharge        Co-evaluation               AM-PAC PT "6 Clicks" Mobility  Outcome Measure Help needed turning from your back to your side while in a flat bed without using bedrails?: A  Little Help needed moving from lying on your back to sitting on the side of a flat bed without using bedrails?: A Little Help needed moving to and from a bed to a chair (including a wheelchair)?: A Little Help needed standing up from a chair using your arms (e.g., wheelchair or bedside chair)?: A Little Help needed to walk in hospital room?: A Little Help needed climbing 3-5 steps with a railing? : A Lot 6 Click Score: 17    End of Session   Activity Tolerance: Patient tolerated treatment well Patient left: in bed;with call bell/phone within reach;with bed alarm set Nurse Communication: Mobility status PT Visit Diagnosis: Unsteadiness on feet (R26.81);Other symptoms and signs involving the nervous system (R29.898)    Time: 1716-1740 PT Time Calculation (min) (ACUTE ONLY): 24 min   Charges:   PT Evaluation $PT Eval Moderate Complexity: 1 Mod PT Treatments $Gait Training: 8-22 mins        [name], PT, DPT Acute Rehabilitation Pager: [phone]   [name] 02/09/2020, 5:51 PM

## 2020-02-09 NOTE — Progress Notes (Addendum)
CRITICAL VALUE STICKER  CRITICAL VALUE: Potassium 2.7  RECEIVER (on-site recipient of call): Erica RN  DATE & TIME NOTIFIED: 02/09/20 0404  MESSENGER (representative from lab):  MD NOTIFIED: Opyd  TIME OF NOTIFICATION: 0406  RESPONSE: three runs of IV potassium

## 2020-02-09 NOTE — Evaluation (Signed)
SLP Cancellation Note  Patient Details Name: Adam [name] MRN: [id number] DOB: 1950-12-02   Cancelled treatment:       Reason Eval/Treat Not Completed: Other (comment)(pt needing rest room at this time, SLP will continue efforts)   Macario Golds 02/09/2020, 2:24 PM  Kathleen Lime, MS [place] Office [phone]

## 2020-02-09 NOTE — Progress Notes (Signed)
[facility] PROGRESS NOTE    Progress Note  MUBEEN AFFLERBACH  [id number] DOB: 01-10-1951 DOA: 02/08/2020 PCP: [name], MD     Brief Narrative:   Adam [name] is an 69 y.o. male past medical history significant for diabetes mellitus type 2 CAD status post CABG, atrial fibrillation not on anticoagulation due to multiple falls, essential hypertension alcohol abuse comes into the ED for frequent falls, he denies any chest pain shortness of breath nausea vomiting or diarrhea he was found in the ED in A. fib with RVR  Assessment/Plan:   High anion gap metabolic acidosis likely due to starvation alcoholic ketosis and or DKA: His A1c is pending, after IV fluid hydration his DKA resolved. Glucose has been ranging 1 59-1 44, but he has not had a meal.  We will start him on low-dose long-acting insulin continue to hold Metformin. Salicylate level is less than 10 This could also be alcoholic/starvation ketosis. Lactic Ringer's can mask his acidosis, although his bicarbonate is 20, will continue IV fluid hydration for the next 24 hours with normal saline.  Baseline creatinine is less than 1.  Wernicke's/Korsakoff  encephalopathy alcohol abuse with alcohol withdrawals: Has nystagmus on physical exam with gait imbalance and encephalopathy. He is having also episodes of retrograde amnesia, speaking to the wife she tells me that he has been confabulating over the last several days. Continue him on thiamine at 100 mg an a day after 3 days of 500 mg IV and continue folate, continue to monitor with CIWA protocol call physician's score greater than 15. The wife tells me he has been having episodes of incontinence where he is urinating outside the toilet and around the house. Check a brain MRI  Atrial fibrillation with RVR ([place]): New onset of A. fib with RVR, he is on Tikosyn not on anticoagulation due to history of multiple falls and alcohol abuse. Continue Tikosyn and metoprolol,  his heart rate ranging around 100, will use IV metoprolol as needed for heart rate greater than 100.  CAD (coronary artery disease): Continue metoprolol and aspirin denies any chest pain.  COPD: Noted.   Small laceration on the right side of the forehead: No active bleeding CT of the head unremarkable.  Essential hypertension: Continue Cozaar and metoprolol.   DVT prophylaxis: lovenox Family Communication:none Status is: Inpatient  Remains inpatient appropriate because:Hemodynamically unstable   Dispo: The patient is from: Home              Anticipated d/c is to: Home              Anticipated d/c date is: 1 day              Patient currently is medically stable to d/c.   Code Status:     Code Status Orders  (From admission, onward)         Start     Ordered   02/08/20 2245  Full code  Continuous     02/08/20 2245        Code Status History    Date Active Date Inactive Code Status Order ID Comments User Context   10/25/2019 0015 10/27/2019 2015 Full Code [id number]  [name], MD ED   09/12/2018 1829 09/13/2018 1905 Full Code [id number]  [name] Inpatient   03/31/2018 1630 04/01/2018 0015 Full Code [id number]  [name], MD Inpatient   12/12/2017 1624 12/18/2017 1801 Full Code [id number]  [name], MD Inpatient  12/12/2017 1622 12/12/2017 1624 Full Code [id number]  [name], MD Inpatient   Advance Care Planning Activity        IV Access:    Peripheral IV   Procedures and diagnostic studies:   CT HEAD WO CONTRAST  Result Date: 02/08/2020 CLINICAL DATA:  Post-traumatic headache. EXAM: CT HEAD WITHOUT CONTRAST TECHNIQUE: Contiguous axial images were obtained from the base of the skull through the vertex without intravenous contrast. COMPARISON:  None. FINDINGS: Brain: No evidence of acute infarction, hemorrhage, hydrocephalus, extra-axial collection or mass lesion/mass effect. Advanced atrophy and chronic microvascular ischemic  changes are noted. Vascular: No hyperdense vessel or unexpected calcification. Skull: Normal. Negative for fracture or focal lesion. Sinuses/Orbits: Mucosal thickening is noted of the left maxillary sinus. The remaining paranasal sinuses and mastoid air cells are essentially clear. Other: None. IMPRESSION: 1. No acute intracranial abnormality. 2. Advanced atrophy and chronic microvascular ischemic changes. Electronically Signed   By: Constance Holster M.D.   On: 02/08/2020 20:56   DG Chest Port 1 View  Result Date: 02/08/2020 CLINICAL DATA:  Pt brought to ED by family after fall in the bathroom. Pt reports he fell around 6 or 7 or 12 today. Pt states is has been on the floor the whole time but then states his wife and neighbor got him up after the fall. Dried blood noted to R eyebrow, bruising under R eye. Pt denies blood thinners. Pt is oriented to self, place, pt states he slipped on urine in the bathroom and hit head on commode EXAM: PORTABLE CHEST 1 VIEW COMPARISON:  12/03/2019 FINDINGS: Stable changes from prior cardiac surgery. Cardiac silhouette is normal in size. No mediastinal or hilar masses. Stable left anterior chest wall loop recorder. Clear lungs.  No pleural effusion or pneumothorax. Skeletal structures are grossly intact. IMPRESSION: No acute cardiopulmonary disease. Electronically Signed   By: Lajean Manes M.D.   On: 02/08/2020 20:58     Medical Consultants:    None.  Anti-Infectives:   none  Subjective:    Adam [name] relates he has a headache, he did tell me that he fell on Sunday and his last drink was 2 days prior to admission  Objective:    Vitals:   02/09/20 0242 02/09/20 0249 02/09/20 0722 02/09/20 0735  BP:   123/85   Pulse:   85   Resp:   20   Temp: 98 F (36.7 C)  97.8 F (36.6 C)   TempSrc: Oral  Oral   SpO2:   98% 98%  Weight:  105.7 kg    Height:  5\' 10"  (1.778 m)     SpO2: 98 %   Intake/Output Summary (Last 24 hours) at 02/09/2020 0935 Last  data filed at 02/09/2020 0829 Gross per 24 hour  Intake 2879.42 ml  Output --  Net 2879.42 ml   Filed Weights   02/09/20 0249  Weight: 105.7 kg    Exam: General exam: In no acute distress. Respiratory system: Good air movement and clear to auscultation. Cardiovascular system: S1 & S2 heard, RRR. No JVD, murmurs, rubs, gallops or clicks.  Gastrointestinal system: Abdomen is nondistended, soft and nontender.  Central nervous system: Alert and oriented. No focal neurological deficits. Extremities: No pedal edema. Skin: No rashes, lesions or ulcers Psychiatry: Judgement and insight appear normal. Mood & affect appropriate.    Data Reviewed:    Labs: Basic Metabolic Panel: Recent Labs  Lab 02/08/20 2015 02/08/20 2015 02/08/20 2043 02/08/20 2043 02/08/20 2244 02/09/20  0252 02/09/20 0802  NA 137  --  139  --   --  138 139  K 2.1*   < > 2.0*   < >  --  2.7* 4.0  CL 94*  --   --   --   --  98 102  CO2 14*  --   --   --   --  18* 20*  GLUCOSE 159*  --   --   --   --  174* 144*  BUN 13  --   --   --   --  10 10  CREATININE 1.19  --   --   --   --  1.16 1.12  CALCIUM 10.2  --   --   --   --  9.4 9.5  MG  --   --   --   --  2.0  --   --    < > = values in this interval not displayed.   GFR Estimated Creatinine Clearance: 75.8 mL/min (by C-G formula based on SCr of 1.12 mg/dL). Liver Function Tests: Recent Labs  Lab 02/08/20 2015  AST 39  ALT 29  ALKPHOS 80  BILITOT 1.7*  PROT 8.2*  ALBUMIN 4.1   Recent Labs  Lab 02/08/20 2015  LIPASE 61*   No results for input(s): AMMONIA in the last 168 hours. Coagulation profile No results for input(s): INR, PROTIME in the last 168 hours. COVID-19 Labs  No results for input(s): DDIMER, FERRITIN, LDH, CRP in the last 72 hours.  Lab Results  Component Value Date   SARSCOV2NAA NEGATIVE 02/08/2020   [place] NEGATIVE 10/24/2019    CBC: Recent Labs  Lab 02/08/20 2015 02/08/20 2043 02/08/20 2244  WBC 11.7*  --   13.4*  NEUTROABS 9.2*  --   --   HGB 15.1 15.6 12.6*  HCT 46.5 46.0 38.8*  MCV 93.0  --  93.7  PLT 296  --  246   Cardiac Enzymes: No results for input(s): CKTOTAL, CKMB, CKMBINDEX, TROPONINI in the last 168 hours. BNP (last 3 results) Recent Labs    12/03/19 1502  PROBNP 266.0*   CBG: Recent Labs  Lab 02/08/20 2001  GLUCAP 145*   D-Dimer: No results for input(s): DDIMER in the last 72 hours. Hgb A1c: No results for input(s): HGBA1C in the last 72 hours. Lipid Profile: No results for input(s): CHOL, HDL, LDLCALC, TRIG, CHOLHDL, LDLDIRECT in the last 72 hours. Thyroid function studies: Recent Labs    02/08/20 2245  TSH 3.137   Anemia work up: No results for input(s): VITAMINB12, FOLATE, FERRITIN, TIBC, IRON, RETICCTPCT in the last 72 hours. Sepsis Labs: Recent Labs  Lab 02/08/20 2015 02/08/20 2222 02/08/20 2244 02/09/20 0802  WBC 11.7*  --  13.4*  --   LATICACIDVEN  --  2.3*  --  1.3   Microbiology Recent Results (from the past 240 hour(s))  Respiratory Panel by RT PCR (Flu A&B, Covid) - Nasopharyngeal Swab     Status: None   Collection Time: 02/08/20 10:26 PM   Specimen: Nasopharyngeal Swab  Result Value Ref Range Status   SARS Coronavirus 2 by RT PCR NEGATIVE NEGATIVE Final    Comment: (NOTE) SARS-CoV-2 target nucleic acids are NOT DETECTED. The SARS-CoV-2 RNA is generally detectable in upper respiratoy specimens during the acute phase of infection. The lowest concentration of SARS-CoV-2 viral copies this assay can detect is 131 copies/mL. A negative result does not preclude SARS-Cov-2 infection and should not be  used as the sole basis for treatment or other patient management decisions. A negative result may occur with  improper specimen collection/handling, submission of specimen other than nasopharyngeal swab, presence of viral mutation(s) within the areas targeted by this assay, and inadequate number of viral copies (<131 copies/mL). A negative  result must be combined with clinical observations, patient history, and epidemiological information. The expected result is Negative. Fact Sheet for Patients:  PinkCheek.be Fact Sheet for Healthcare Providers:  GravelBags.it This test is not yet ap proved or cleared by the [place] FDA and  has been authorized for detection and/or diagnosis of SARS-CoV-2 by FDA under an Emergency Use Authorization (EUA). This EUA will remain  in effect (meaning this test can be used) for the duration of the COVID-19 declaration under Section 564(b)(1) of the Act, 21 U.S.C. section 360bbb-3(b)(1), unless the authorization is terminated or revoked sooner.    Influenza A by PCR NEGATIVE NEGATIVE Final   Influenza B by PCR NEGATIVE NEGATIVE Final    Comment: (NOTE) The Xpert Xpress SARS-CoV-2/FLU/RSV assay is intended as an aid in  the diagnosis of influenza from Nasopharyngeal swab specimens and  should not be used as a sole basis for treatment. Nasal washings and  aspirates are unacceptable for Xpert Xpress SARS-CoV-2/FLU/RSV  testing. Fact Sheet for Patients: PinkCheek.be Fact Sheet for Healthcare Providers: GravelBags.it This test is not yet approved or cleared by the [place] FDA and  has been authorized for detection and/or diagnosis of SARS-CoV-2 by  FDA under an Emergency Use Authorization (EUA). This EUA will remain  in effect (meaning this test can be used) for the duration of the  Covid-19 declaration under Section 564(b)(1) of the Act, 21  U.S.C. section 360bbb-3(b)(1), unless the authorization is  terminated or revoked. Performed at [facility], [place] [address], [place], [place] [address]      Medications:   . allopurinol  300 mg Oral Daily  . aspirin  81 mg Oral Daily  . dofetilide  125 mcg Oral BID  . DULoxetine  60 mg Oral Daily  . enoxaparin  (LOVENOX) injection  40 mg Subcutaneous Q24H  . feeding supplement (ENSURE ENLIVE)  237 mL Oral BID BM  . fenofibrate  160 mg Oral Daily  . fluticasone furoate-vilanterol  1 puff Inhalation Daily  . folic acid  1 mg Oral Daily  . icosapent Ethyl  2 g Oral BID  . LORazepam  0-4 mg Intravenous Q4H   Followed by  . [START ON 02/11/2020] LORazepam  0-4 mg Intravenous Q8H  . losartan  100 mg Oral Daily  . metoprolol tartrate  100 mg Oral BID  . multivitamin with minerals  1 tablet Oral Daily  . pantoprazole  40 mg Oral BID  . potassium chloride  40 mEq Oral Once  . rosuvastatin  40 mg Oral Daily  . thiamine  100 mg Oral Daily   Or  . thiamine  100 mg Intravenous Daily  . umeclidinium bromide  1 puff Inhalation Daily   Continuous Infusions: . lactated ringers 125 mL/hr at 02/09/20 0926      LOS: 1 day   Charlynne Cousins  [facility]  02/09/2020, 9:35 AM

## 2020-02-09 NOTE — Progress Notes (Signed)
Initial Nutrition Assessment  DOCUMENTATION CODES:   Obesity unspecified  INTERVENTION:  Glucerna Shake po TID, each supplement provides 220 kcal and 10 grams of protein Prostat 30 ml po BID, each supplement provides 100 kcal and 15 grams of protein  Monitor magnesium, potassium, and phosphorus daily for at least 3 days, MD to replete as needed, as pt is at risk for refeeding syndrome given history of alcohol abuse, current hypophosphatemia, resolved hypokalemia per labs   NUTRITION DIAGNOSIS:   Inadequate oral intake related to lethargy/confusion(Wenicke's/Korsakoff encephalopathy) as evidenced by percent weight loss, meal completion < 25%.    GOAL:   Patient will meet greater than or equal to 90% of their needs    MONITOR:   Labs, I & O's, Supplement acceptance, PO intake, Weight trends  REASON FOR ASSESSMENT:   Malnutrition Screening Tool    ASSESSMENT:  RD working remotely.  69 year old male with past medical history of DM2, CAD s/p CABG, atrial fibrillation, HTN, alcohol abuse admitted for atrial fibrillation with RVR after presenting for evaluation of frequent falls.  RD attempted to contact patient via phone this afternoon, however pt did not pick up. He is on a HH/CM diet, noted 0% po intake of breakfast meal this morning. Will provide Glucerna Shake as well as Prostat to aid with meeting needs. Per chart review, patient endorses drinking 4-5 scotch drinks daily and reports getting the shakes if he does not have alcohol, no history of DTs or seizures. Patient having episodes of retrograde amnesia, his wife reports he has been confabulating over the past several days and having episodes of incontinence, urinating outside of the toilet and around the house.   Per notes: -brain MRI -wernicke's/korsakoff encephalopathy alcohol abuse with alcohol withdrawal -high anion gap metabolic acidosis likely due to starvation alcoholic ketosis/DKA -[id number] pending after IV hydration  and resolved DKA -small laceration to right forehead, CT head unremarkable  Current wt 232.54 lbs Per history, weights have been stable 249-155 lbs over the past 4 months and on 01/23/20 he weighed 249.26 lbs. This indicates a 16.72 lb (6.7%) wt loss in the past 2 weeks which is significant. Given this as well as history significant of alcohol abuse highly suspect malnutrition, however unable to identify at this time.   I/Os: +2522 ml since admit UOP: 500 ml since admit Medications reviewed and include: dofetilide, cymbalta, fenofibrate, folic acid, vascepa, levemir, ativan, cozaar, MVI, protonix, klor-con, thiamine IVF: NaCl IVPB: Thiamine 500 mg  Labs: CBG 145, K 3.9 (2.7 on admit), P 1.4 (L), Mg 1.8 (WNL)   NUTRITION - FOCUSED PHYSICAL EXAM: Unable to complete at this time, RD working remotely.  Diet Order:   Diet Order            Diet heart healthy/carb modified Room service appropriate? Yes; Fluid consistency: Thin  Diet effective now              EDUCATION NEEDS:   Not appropriate for education at this time  Skin:  Skin Assessment: Reviewed RN Assessment  Last BM:  5/7  Height:   Ht Readings from Last 1 Encounters:  02/09/20 5\' 10"  (1.778 m)    Weight:   Wt Readings from Last 1 Encounters:  02/09/20 105.7 kg    BMI:  Body mass index is 33.43 kg/m.  Estimated Nutritional Needs:   Kcal:  2200-2400  Protein:  110-120  Fluid:  >/= 2.2 L/day   Lajuan Lines, RD, LDN Clinical Nutrition After Hours/Weekend Pager # in  Amion

## 2020-02-10 ENCOUNTER — Inpatient Hospital Stay (HOSPITAL_COMMUNITY): Payer: Medicare Other

## 2020-02-10 DIAGNOSIS — G912 (Idiopathic) normal pressure hydrocephalus: Secondary | ICD-10-CM

## 2020-02-10 LAB — BASIC METABOLIC PANEL
Anion gap: 10 (ref 5–15)
Anion gap: 9 (ref 5–15)
BUN: 9 mg/dL (ref 8–23)
BUN: 11 mg/dL (ref 8–23)
CO2: 25 mmol/L (ref 22–32)
CO2: 27 mmol/L (ref 22–32)
Calcium: 8.9 mg/dL (ref 8.9–10.3)
Calcium: 9.1 mg/dL (ref 8.9–10.3)
Chloride: 108 mmol/L (ref 98–111)
Chloride: 108 mmol/L (ref 98–111)
Creatinine, Ser: 0.85 mg/dL (ref 0.61–1.24)
Creatinine, Ser: 0.79 mg/dL (ref 0.61–1.24)
GFR calc Af Amer: >60 mL/min (ref >60)
GFR calc Af Amer: >60 mL/min (ref >60)
GFR calc non Af Amer: >60 mL/min (ref >60)
GFR calc non Af Amer: >60 mL/min (ref >60)
Glucose, Bld: 144 mg/dL — ABNORMAL HIGH (ref 70–99)
Glucose, Bld: 195 mg/dL — ABNORMAL HIGH (ref 70–99)
Potassium: 3.4 mmol/L — ABNORMAL LOW (ref 3.5–5.1)
Potassium: 3.9 mmol/L (ref 3.5–5.1)
Sodium: 143 mmol/L (ref 135–145)
Sodium: 144 mmol/L (ref 135–145)

## 2020-02-10 LAB — CUP PACEART REMOTE DEVICE CHECK
Date Time Interrogation Session: 20210509043411
Implantable Pulse Generator Implant Date: 20200928

## 2020-02-10 LAB — HEMOGLOBIN A1C
Hgb A1c MFr Bld: 6.6 % — ABNORMAL HIGH (ref 4.8–5.6)
Mean Plasma Glucose: 142.72 mg/dL

## 2020-02-10 LAB — MAGNESIUM: Magnesium: 2.2 mg/dL (ref 1.7–2.4)

## 2020-02-10 MED ORDER — MAGNESIUM OXIDE 400 (241.3 MG) MG PO TABS
400.0000 mg | ORAL_TABLET | Freq: Two times a day (BID) | ORAL | Status: AC
  Administered 2020-02-10 (×2): 400 mg via ORAL
  Filled 2020-02-10 (×2): qty 1

## 2020-02-10 MED ORDER — POTASSIUM CHLORIDE CRYS ER 20 MEQ PO TBCR
40.0000 meq | EXTENDED_RELEASE_TABLET | Freq: Three times a day (TID) | ORAL | Status: DC
  Administered 2020-02-10 (×2): 40 meq via ORAL
  Filled 2020-02-10 (×2): qty 2

## 2020-02-10 MED ORDER — POTASSIUM PHOSPHATES 15 MMOLE/5ML IV SOLN
48.0000 mmol | Freq: Once | INTRAVENOUS | Status: AC
  Administered 2020-02-10: 48 mmol via INTRAVENOUS
  Filled 2020-02-10: qty 16

## 2020-02-10 NOTE — Progress Notes (Signed)
[facility] PROGRESS NOTE    Progress Note  Adam [name]  [id number] DOB: [birth year] DOA: 02/08/2020 PCP: [name], MD     Brief Narrative:   Adam [name] is an 69 y.o. male past medical history significant for diabetes mellitus type 2 CAD status post CABG, atrial fibrillation not on anticoagulation due to multiple falls, essential hypertension alcohol abuse comes into the ED for frequent falls, he denies any chest pain shortness of breath nausea vomiting or diarrhea he was found in the ED in A. fib with RVR  Assessment/Plan:   High anion gap metabolic acidosis likely due to starvation alcoholic ketosis and or DKA: Glucose has been ranging 1 59-1 44, but he has not had a meal.   Continue low-dose long-acting insulin Bicarbonate is 25.  Possible Wernicke's/Korsakoff  encephalopathy versus normal pressure hydrocephalus: Has nystagmus on physical exam with gait imbalance and encephalopathy. He is having also episodes of retrograde amnesia, speaking to the wife she tells me that he has been confabulating over the last several days, has been having episodes of incontinence at home. Has a white base at home MRI showed ventriculomegaly and severe atrophy.   We will consult neurology TSH is 3.1, B12 is 357  Alcohol withdrawals: The nurse relate the patient was confused and combative yesterday, score was about 9 he received Ativan and he became more calm. Continue thiamine and folate and Ativan per protocol.  Atrial fibrillation with RVR ([place]): New onset of A. fib with RVR, he is on Tikosyn not on anticoagulation due to history of multiple falls and alcohol abuse. Continue Tikosyn and metoprolol, his heart rate ranging around 100, will use IV metoprolol as needed for heart rate greater than 100.  CAD (coronary artery disease): Continue metoprolol and aspirin denies any chest pain.  COPD: Noted.   Small laceration on the right side of the forehead: No active  bleeding CT of the head unremarkable.  Essential hypertension: Continue Cozaar and metoprolol.   DVT prophylaxis: lovenox Family Communication:none Status is: Inpatient  Remains inpatient appropriate because:Hemodynamically unstable   Dispo: The patient is from: Home              Anticipated d/c is to: Home              Anticipated d/c date is: 1 day              Patient currently is medically stable to d/c.   Code Status:     Code Status Orders  (From admission, onward)         Start     Ordered   02/08/20 2245  Full code  Continuous     02/08/20 2245        Code Status History    Date Active Date Inactive Code Status Order ID Comments User Context   10/25/2019 0015 10/27/2019 2015 Full Code [id number]  [name], MD ED   09/12/2018 1829 09/13/2018 1905 Full Code [id number]  [name] Inpatient   03/31/2018 1630 04/01/2018 0015 Full Code [id number]  [name], MD Inpatient   12/12/2017 1624 12/18/2017 1801 Full Code [id number]  [name], MD Inpatient   12/12/2017 1622 12/12/2017 1624 Full Code [id number]  [name], MD Inpatient   Advance Care Planning Activity        IV Access:    Peripheral IV   Procedures and diagnostic studies:   CT HEAD WO CONTRAST  Result Date: 02/08/2020 CLINICAL DATA:  Post-traumatic headache. EXAM: CT HEAD WITHOUT CONTRAST TECHNIQUE: Contiguous axial images were obtained from the base of the skull through the vertex without intravenous contrast. COMPARISON:  None. FINDINGS: Brain: No evidence of acute infarction, hemorrhage, hydrocephalus, extra-axial collection or mass lesion/mass effect. Advanced atrophy and chronic microvascular ischemic changes are noted. Vascular: No hyperdense vessel or unexpected calcification. Skull: Normal. Negative for fracture or focal lesion. Sinuses/Orbits: Mucosal thickening is noted of the left maxillary sinus. The remaining paranasal sinuses and mastoid air cells are  essentially clear. Other: None. IMPRESSION: 1. No acute intracranial abnormality. 2. Advanced atrophy and chronic microvascular ischemic changes. Electronically Signed   By: Constance Holster M.D.   On: 02/08/2020 20:56   MR BRAIN WO CONTRAST  Result Date: 02/10/2020 CLINICAL DATA:  Initial evaluation for acute encephalopathy. EXAM: MRI HEAD WITHOUT CONTRAST TECHNIQUE: Multiplanar, multiecho pulse sequences of the brain and surrounding structures were obtained without intravenous contrast. COMPARISON:  Comparison made with prior head CT from 02/08/2020. FINDINGS: Brain: Diffuse prominence of the CSF containing spaces compatible with moderately advanced cerebral atrophy. Mild scattered T2/FLAIR hyperintensity within the periventricular white matter most consistent with chronic small vessel ischemic disease, minimal for age. No abnormal foci of restricted diffusion to suggest acute or subacute ischemia. Gray-white matter differentiation maintained. No encephalomalacia to suggest chronic cortical infarction. No foci of susceptibility artifact to suggest acute or chronic intracranial hemorrhage. No mass lesion, midline shift or mass effect. Diffuse ventriculomegaly, which could be related to global parenchymal volume loss. A degree of NPH could be contributory. Periventricular FLAIR hyperintensity could in part reflect transependymal flow of CSF in that setting. No extra-axial fluid collection. Pituitary gland within normal limits. Midline structures intact. Vascular: Major intracranial vascular flow voids are maintained. Skull and upper cervical spine: Craniocervical junction within normal limits. Upper cervical spine normal. Bone marrow signal intensity within normal limits. No scalp soft tissue abnormality. Sinuses/Orbits: Globes and orbital soft tissues within normal limits. Mucosal thickening with air-fluid level present within the left maxillary sinus, consistent with acute sinusitis. Scattered mucosal  thickening noted within the left ethmoidal air cells as well. No mastoid effusion. Inner ear structures grossly normal. Other: None. IMPRESSION: 1. No acute intracranial abnormality. 2. Moderately advanced cerebral and cerebellar atrophy. 3. Diffuse ventriculomegaly. While this finding is undoubtedly at least in part related to underlying global parenchymal atrophy, a degree of NPH could also be contributory in the correct clinical setting. 4. Acute left maxillary sinusitis. Electronically Signed   By: Jeannine Boga M.D.   On: 02/10/2020 01:03   DG Chest Port 1 View  Result Date: 02/08/2020 CLINICAL DATA:  Pt brought to ED by family after fall in the bathroom. Pt reports he fell around 6 or 7 or 12 today. Pt states is has been on the floor the whole time but then states his wife and neighbor got him up after the fall. Dried blood noted to R eyebrow, bruising under R eye. Pt denies blood thinners. Pt is oriented to self, place, pt states he slipped on urine in the bathroom and hit head on commode EXAM: PORTABLE CHEST 1 VIEW COMPARISON:  12/03/2019 FINDINGS: Stable changes from prior cardiac surgery. Cardiac silhouette is normal in size. No mediastinal or hilar masses. Stable left anterior chest wall loop recorder. Clear lungs.  No pleural effusion or pneumothorax. Skeletal structures are grossly intact. IMPRESSION: No acute cardiopulmonary disease. Electronically Signed   By: Lajean Manes M.D.   On: 02/08/2020 20:58     Medical Consultants:  None.  Anti-Infectives:   none  Subjective:    [name] he has not hungry, he relates he has not had any food except for Ensure.  Objective:    Vitals:   02/09/20 1600 02/09/20 2131 02/10/20 0430 02/10/20 0753  BP: (!) 142/89 126/84 (!) 136/95   Pulse: 81 95 88   Resp: 20 16 18    Temp: (!) 97.3 F (36.3 C) 98 F (36.7 C) 97.9 F (36.6 C)   TempSrc: Oral Oral Oral   SpO2: 98% 97% 99% 97%  Weight:   109.8 kg   Height:        SpO2: 97 %   Intake/Output Summary (Last 24 hours) at 02/10/2020 0804 Last data filed at 02/10/2020 0250 Gross per 24 hour  Intake 2572.85 ml  Output 500 ml  Net 2072.85 ml   Filed Weights   02/09/20 0249 02/10/20 0430  Weight: 105.7 kg 109.8 kg    Exam: General exam: In no acute distress. Respiratory system: Good air movement and clear to auscultation. Cardiovascular system: S1 & S2 heard, RRR. No JVD. Gastrointestinal system: Abdomen is nondistended, soft and nontender.  Extremities: No pedal edema. Skin: No rashes, lesions or ulcers  Data Reviewed:    Labs: Basic Metabolic Panel: Recent Labs  Lab 02/08/20 2015 02/08/20 2015 02/08/20 2043 02/08/20 2043 02/08/20 2244 02/09/20 0252 02/09/20 0252 02/09/20 0802 02/09/20 0802 02/09/20 1117 02/10/20 0436  NA 137   < > 139  --   --  138  --  139  --  139 143  K 2.1*   < > 2.0*   < >  --  2.7*   < > 4.0   < > 3.9 3.4*  CL 94*  --   --   --   --  98  --  102  --  100 108  CO2 14*  --   --   --   --  18*  --  20*  --  22 25  GLUCOSE 159*  --   --   --   --  174*  --  144*  --  174* 144*  BUN 13  --   --   --   --  10  --  10  --  11 9  CREATININE 1.19  --   --   --   --  1.16  --  1.12  --  0.94 0.85  CALCIUM 10.2  --   --   --   --  9.4  --  9.5  --  9.6 8.9  MG  --   --   --   --  2.0  --   --   --   --  1.8 2.2  PHOS  --   --   --   --   --   --   --   --   --  1.4*  --    < > = values in this interval not displayed.   GFR Estimated Creatinine Clearance: 101.7 mL/min (by C-G formula based on SCr of 0.85 mg/dL). Liver Function Tests: Recent Labs  Lab 02/08/20 2015 02/09/20 1117  AST 39 31  ALT 29 25  ALKPHOS 80 63  BILITOT 1.7* 1.6*  PROT 8.2* 6.0*  ALBUMIN 4.1 3.3*   Recent Labs  Lab 02/08/20 2015  LIPASE 61*   No results for input(s): AMMONIA in the last 168 hours. Coagulation profile No results for input(s): INR, PROTIME  in the last 168 hours. COVID-19 Labs  No results for input(s): DDIMER,  FERRITIN, LDH, CRP in the last 72 hours.  Lab Results  Component Value Date   SARSCOV2NAA NEGATIVE 02/08/2020   [place] NEGATIVE 10/24/2019    CBC: Recent Labs  Lab 02/08/20 2015 02/08/20 2043 02/08/20 2244 02/09/20 1117  WBC 11.7*  --  13.4* 7.8  NEUTROABS 9.2*  --   --   --   HGB 15.1 15.6 12.6* 12.9*  HCT 46.5 46.0 38.8* 40.0  MCV 93.0  --  93.7 92.8  PLT 296  --  246 232   Cardiac Enzymes: No results for input(s): CKTOTAL, CKMB, CKMBINDEX, TROPONINI in the last 168 hours. BNP (last 3 results) Recent Labs    12/03/19 1502  PROBNP 266.0*   CBG: Recent Labs  Lab 02/08/20 2001  GLUCAP 145*   D-Dimer: No results for input(s): DDIMER in the last 72 hours. Hgb A1c: No results for input(s): HGBA1C in the last 72 hours. Lipid Profile: No results for input(s): CHOL, HDL, LDLCALC, TRIG, CHOLHDL, LDLDIRECT in the last 72 hours. Thyroid function studies: Recent Labs    02/08/20 2245  TSH 3.137   Anemia work up: Recent Labs    02/09/20 1117  VITAMINB12 353   Sepsis Labs: Recent Labs  Lab 02/08/20 2015 02/08/20 2222 02/08/20 2244 02/09/20 0802 02/09/20 1117  WBC 11.7*  --  13.4*  --  7.8  LATICACIDVEN  --  2.3*  --  1.3 1.9   Microbiology Recent Results (from the past 240 hour(s))  Respiratory Panel by RT PCR (Flu A&B, Covid) - Nasopharyngeal Swab     Status: None   Collection Time: 02/08/20 10:26 PM   Specimen: Nasopharyngeal Swab  Result Value Ref Range Status   SARS Coronavirus 2 by RT PCR NEGATIVE NEGATIVE Final    Comment: (NOTE) SARS-CoV-2 target nucleic acids are NOT DETECTED. The SARS-CoV-2 RNA is generally detectable in upper respiratoy specimens during the acute phase of infection. The lowest concentration of SARS-CoV-2 viral copies this assay can detect is 131 copies/mL. A negative result does not preclude SARS-Cov-2 infection and should not be used as the sole basis for treatment or other patient management decisions. A negative  result may occur with  improper specimen collection/handling, submission of specimen other than nasopharyngeal swab, presence of viral mutation(s) within the areas targeted by this assay, and inadequate number of viral copies (<131 copies/mL). A negative result must be combined with clinical observations, patient history, and epidemiological information. The expected result is Negative. Fact Sheet for Patients:  PinkCheek.be Fact Sheet for Healthcare Providers:  GravelBags.it This test is not yet ap proved or cleared by the [place] FDA and  has been authorized for detection and/or diagnosis of SARS-CoV-2 by FDA under an Emergency Use Authorization (EUA). This EUA will remain  in effect (meaning this test can be used) for the duration of the COVID-19 declaration under Section 564(b)(1) of the Act, 21 U.S.C. section 360bbb-3(b)(1), unless the authorization is terminated or revoked sooner.    Influenza A by PCR NEGATIVE NEGATIVE Final   Influenza B by PCR NEGATIVE NEGATIVE Final    Comment: (NOTE) The Xpert Xpress SARS-CoV-2/FLU/RSV assay is intended as an aid in  the diagnosis of influenza from Nasopharyngeal swab specimens and  should not be used as a sole basis for treatment. Nasal washings and  aspirates are unacceptable for Xpert Xpress SARS-CoV-2/FLU/RSV  testing. Fact Sheet for Patients: PinkCheek.be Fact Sheet for Healthcare Providers: GravelBags.it This  test is not yet approved or cleared by the [place] and  has been authorized for detection and/or diagnosis of SARS-CoV-2 by  FDA under an Emergency Use Authorization (EUA). This EUA will remain  in effect (meaning this test can be used) for the duration of the  Covid-19 declaration under Section 564(b)(1) of the Act, 21  U.S.C. section 360bbb-3(b)(1), unless the authorization is  terminated or  revoked. Performed at [facility], [place] [address], [place], [place] [address]      Medications:   . allopurinol  300 mg Oral Daily  . aspirin  81 mg Oral Daily  . dofetilide  125 mcg Oral BID  . DULoxetine  60 mg Oral Daily  . enoxaparin (LOVENOX) injection  40 mg Subcutaneous Q24H  . feeding supplement (ENSURE ENLIVE)  237 mL Oral BID BM  . fenofibrate  160 mg Oral Daily  . fluticasone furoate-vilanterol  1 puff Inhalation Daily  . folic acid  1 mg Oral Daily  . icosapent Ethyl  2 g Oral BID  . insulin detemir  5 Units Subcutaneous Daily  . LORazepam  0-4 mg Intravenous Q6H   Followed by  . [START ON 02/11/2020] LORazepam  0-4 mg Intravenous Q12H  . losartan  100 mg Oral Daily  . metoprolol tartrate  100 mg Oral BID  . multivitamin with minerals  1 tablet Oral Daily  . pantoprazole  40 mg Oral BID  . rosuvastatin  40 mg Oral Daily  . thiamine  100 mg Oral Daily   Or  . thiamine  100 mg Intravenous Daily  . umeclidinium bromide  1 puff Inhalation Daily   Continuous Infusions: . sodium chloride 100 mL/hr at 02/10/20 0052  . thiamine injection 500 mg (02/09/20 1106)      LOS: 2 days   Charlynne Cousins  [facility]  02/10/2020, 8:04 AM

## 2020-02-10 NOTE — Evaluation (Signed)
Clinical/Bedside Swallow Evaluation Patient Details  Name: GEDDY PALUZZI MRN: [id number] Date of Birth: 01-Jun-1951  Today's Date: 02/10/2020 Time: SLP Start Time (ACUTE ONLY): 11 SLP Stop Time (ACUTE ONLY): 1123 SLP Time Calculation (min) (ACUTE ONLY): 12 min  Past Medical History:  Past Medical History:  Diagnosis Date  . Allergy   . Anxiety    history of PTSD following CABG  . Ascending aortic aneurysm ([place]) 01/31/2018   43 x 42 mm, pt unaware  . Asthma   . Cardiomegaly 10/17/2017  . Colitis- colonoscopy 2014 07/13/2015  . COPD (chronic obstructive pulmonary disease) ([place])   . Coronary artery disease    x 6  . Depression   . Diabetes mellitus without complication ([place])   . Family history of polyps in the colon   . Finger dislocation    Left pinkie  . GERD (gastroesophageal reflux disease)   . Gout   . H/O atrial fibrillation without current medication    following CABG with no documented episodes since then.  . Heart palpitations   . Hx of adenomatous colonic polyps 08/12/2010  . Hyperlipidemia   . Hypertension   . OA (osteoarthritis)   . OSA (obstructive sleep apnea)    Mild, has not received CPAP yet  . Prediabetes   . RLS (restless legs syndrome)   . 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 ANKLE FRACTURE SURGERY Right 1991  . APPENDECTOMY    . ATRIAL FIBRILLATION ABLATION N/A 03/31/2018   Procedure: ATRIAL FIBRILLATION ABLATION;  Surgeon: [name], MD;  Location: [facility];  Service: Cardiovascular;  Laterality: N/A;  . CARDIAC ELECTROPHYSIOLOGY MAPPING AND ABLATION    . COLONOSCOPY W/ BIOPSIES  2017   x7  . CORONARY ANGIOPLASTY WITH STENT PLACEMENT    . CORONARY ARTERY BYPASS GRAFT    . FINGER SURGERY  04/2018   Small finger left hand  . implantable loop recorder placement  07/02/2019   [employer] Reveal [place] model [id number] ([id number] [id number]) implanted in office  by Dr Rayann Heman  . TEE WITHOUT CARDIOVERSION N/A 03/30/2018   Procedure: TRANSESOPHAGEAL ECHOCARDIOGRAM (TEE);  Surgeon: [name], MD;  Location: [facility];  Service: Cardiovascular;  Laterality: N/A;  . TOTAL HIP ARTHROPLASTY Left   . TOTAL HIP ARTHROPLASTY Right 09/12/2018   Procedure: TOTAL HIP ARTHROPLASTY ANTERIOR APPROACH;  Surgeon: [name], MD;  Location: [facility];  Service: Orthopedics;  Laterality: Right;  70 mins   HPI:  DEMAJ BATTERSBY is a 69 y.o. male with history of diabetes mellitus type 2, CAD status post CABG, A. fib not on anticoagulation probably from risk of falls with history of hypertension alcohol abuse presents to the ER the patient had a fall at home.  MRI was negative for acute intracranial abnormality.    Assessment / Plan / Recommendation Clinical Impression  Pt was seen for a bedside swallow evaluation.  RN reported that pt has had poor solid intake but that he was tolerating liquids without difficulty.  Pt reported that he has not had an appetite recently, but stated that he did not have any difficulty consuming solids.  He also reported a hx of reflux and stated that he takes medication at home to manage it.  Pt consumed trials of thin liquid, puree, and regular solids.  He fed himself independently and he demonstrated good bolus acceptance, timely mastication of regular solids, and suspected timely  AP transport/swallow initiation.  Eructation was observed following all liquid trials despite bolus size and delayed coughing was observed x1 following serial sips of thin liquid and eructation.  Given hx of reflux and clinical presentation, suspect esophageal etiology vs pharyngeal dysfunction.  No additional s/sx of aspiration were observed with any PO trials.  Pt is likely at an increased risk for post-prandial aspiration and he would benefit from use of reflux precautions during PO intake.  Recommend continuation of regular solids and thin liquids with medications  administered whole with puree and strict adherence to reflux precautions/compensatory strategies.  SLP will briefly f/u for education and to monitor diet tolerance.   SLP Visit Diagnosis: Dysphagia, unspecified (R13.10)    Aspiration Risk  Mild aspiration risk    Diet Recommendation Regular;Thin liquid   Liquid Administration via: Cup;Straw Medication Administration: Whole meds with puree Supervision: Patient able to self feed Compensations: Slow rate;Small sips/bites Postural Changes: Seated upright at 90 degrees;Remain upright for at least 30 minutes after po intake    Other  Recommendations Oral Care Recommendations: Oral care BID   Follow up Recommendations None      Frequency and Duration min 1 x/week  2 weeks       Prognosis Prognosis for Safe Diet Advancement: Good      Swallow Study   General HPI: LAMARI MOSCONE is a 69 y.o. male with history of diabetes mellitus type 2, CAD status post CABG, A. fib not on anticoagulation probably from risk of falls with history of hypertension alcohol abuse presents to the ER the patient had a fall at home.  MRI was negative for acute intracranial abnormality.  Type of Study: Bedside Swallow Evaluation Previous Swallow Assessment: None  Diet Prior to this Study: Regular;Thin liquids Temperature Spikes Noted: No Respiratory Status: Room air History of Recent Intubation: No Behavior/Cognition: Alert;Cooperative;Pleasant mood Oral Cavity Assessment: Within Functional Limits Oral Care Completed by SLP: No Oral Cavity - Dentition: Adequate natural dentition Vision: Functional for self-feeding Self-Feeding Abilities: Able to feed self Patient Positioning: Upright in bed Baseline Vocal Quality: Normal Volitional Cough: Strong Volitional Swallow: Able to elicit    Oral/Motor/Sensory Function Overall Oral Motor/Sensory Function: Within functional limits   Ice Chips Ice chips: Not tested   Thin Liquid Thin Liquid:  Impaired Presentation: Straw;Cup Pharyngeal  Phase Impairments: Cough - Delayed    Nectar Thick Nectar Thick Liquid: Not tested   Honey Thick Honey Thick Liquid: Not tested   Puree Puree: Within functional limits Presentation: Spoon;Self Fed   Solid     Solid: Within functional limits Presentation: [place]., M.S., [place] Office: [phone]  [place] 02/10/2020,11:37 AM

## 2020-02-10 NOTE — Consult Note (Addendum)
NEURO HOSPITALIST CONSULT NOTE   Requestig physician: Dr. Aileen Fass  Reason for Consult: Memory loss with confabulation in conjunction with gait instability and recurrent falls.   History obtained from:  Patient    HPI:                                                                                                                                          Adam BRETTSCHNEIDER is a 69 y.o. male with a PMHx of SCC (2016), RLS, HTN, HLD, OSA, atrial fibrillation ( not anticoagulated), ETOH abuse, DM, CAD s/p CABG, COPD, AAA(2019) who presented to [facility] for evaluation of a 2 year history of gradual memory loss/cognitive dysfunction with an approximately 4 week history of urinary incontinence and progressive gait instability.  Per patient, for the past 2 years he has noticed that his memory has slowed down some and that he has been "not as sharp". He began having trouble walking as well as problems controlling his bladder about 1 month ago. For the last 3 weeks he has been using a walker and having increased falls, with significantly more falls happening over the past week.  The last time he fell he hit his head and gashed his forehead. Endorses drinking ETOH about 6 oz total of liquor per day and 3 glasses of wine. Former smoker, no drug abuse. Does not use any anticoagulation d/t rectal bleed after starting Eliquis and then he refused to try any other blood thinners.   Hospital course:  02/08/20 admitted, a. 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  Past Medical History:  Diagnosis Date  . Allergy   . Anxiety    history of PTSD following CABG  . Ascending aortic aneurysm ([place]) 01/31/2018   43 x 42 mm, pt unaware  . Asthma   . Cardiomegaly 10/17/2017  . Colitis- colonoscopy 2014 07/13/2015  . COPD (chronic obstructive  pulmonary disease) ([place])   . Coronary artery disease    x 6  . Depression   . Diabetes mellitus without complication ([place])   . Family history of polyps in the colon   . Finger dislocation    Left pinkie  . GERD (gastroesophageal reflux disease)   . Gout   . H/O atrial fibrillation without current medication    following CABG with no documented episodes since then.  . Heart palpitations   . Hx of adenomatous colonic polyps 08/12/2010  . Hyperlipidemia   . Hypertension   . OA (osteoarthritis)   . OSA (obstructive sleep apnea)    Mild, has not received CPAP yet  . Prediabetes   . RLS (restless legs syndrome)   . Squamous cell carcinoma  of scalp 2016   Moh's    Past Surgical History:  Procedure Laterality Date  . ANKLE FRACTURE SURGERY Right 1991  . APPENDECTOMY    . ATRIAL FIBRILLATION ABLATION N/A 03/31/2018   Procedure: ATRIAL FIBRILLATION ABLATION;  Surgeon: [name], MD;  Location: [place] [facility];  Service: Cardiovascular;  Laterality: N/A;  . CARDIAC ELECTROPHYSIOLOGY MAPPING AND ABLATION    . COLONOSCOPY W/ BIOPSIES  2017   x7  . CORONARY ANGIOPLASTY WITH STENT PLACEMENT    . CORONARY ARTERY BYPASS GRAFT    . FINGER SURGERY  04/2018   Small finger left hand  . implantable loop recorder placement  07/02/2019   Medtronic Reveal [place] model [id number] ([id number] [id number]) implanted in office by Dr Rayann Heman  . TEE WITHOUT CARDIOVERSION N/A 03/30/2018   Procedure: TRANSESOPHAGEAL ECHOCARDIOGRAM (TEE);  Surgeon: [name], MD;  Location: [facility];  Service: Cardiovascular;  Laterality: N/A;  . TOTAL HIP ARTHROPLASTY Left   . TOTAL HIP ARTHROPLASTY Right 09/12/2018   Procedure: TOTAL HIP ARTHROPLASTY ANTERIOR APPROACH;  Surgeon: [name], MD;  Location: [facility];  Service: Orthopedics;  Laterality: Right;  70 mins    Family History  Problem Relation Age of Onset  . Colon cancer Mother   . Cancer Father        UNKNOWN TYPE  . Heart disease Paternal Grandmother    . Heart disease Paternal Grandfather         Social History:  reports that he quit smoking about 42 years ago. His smoking use included cigarettes. He has a 7.00 pack-year smoking history. He has never used smokeless tobacco. He reports current alcohol use of about 3.0 - 4.0 standard drinks of alcohol per week. He reports that he does not use drugs.  Allergies  Allergen Reactions  . Xarelto [Rivaroxaban] Other (See Comments)    Patient stated he "ended up in the hospital with a rectal bleed"  . Amoxicillin Rash and Other (See Comments)    * SEVERE RASH IN GROIN AREA Has patient had a PCN reaction causing immediate rash, facial/tongue/throat swelling, SOB or lightheadedness with hypotension: no Has patient had a PCN reaction causing severe rash involving mucus membranes or skin necrosis: no Has patient had a PCN reaction that required hospitalization: no Has patient had a PCN reaction occurring within the last 10 years: yes If all of the above answers are "NO", then may proceed with Cephalosporin use.   . Augmentin [Amoxicillin-Pot Clavulanate] Rash and Other (See Comments)    * SEVERE RASH IN GROIN AREA  . Azithromycin Rash and Other (See Comments)    * SEVERE RASH IN GROIN AREA  . Clindamycin/Lincomycin Rash  . Keflex [Cephalexin] Rash    MEDICATIONS:                                                                                                                     Scheduled: . allopurinol  300 mg Oral Daily  . aspirin  81 mg Oral Daily  .  dofetilide  125 mcg Oral BID  . DULoxetine  60 mg Oral Daily  . enoxaparin (LOVENOX) injection  40 mg Subcutaneous Q24H  . feeding supplement (ENSURE ENLIVE)  237 mL Oral BID BM  . fenofibrate  160 mg Oral Daily  . fluticasone furoate-vilanterol  1 puff Inhalation Daily  . folic acid  1 mg Oral Daily  . icosapent Ethyl  2 g Oral BID  . insulin detemir  5 Units Subcutaneous Daily  . LORazepam  0-4 mg Intravenous Q6H   Followed by  . [START  ON 02/11/2020] LORazepam  0-4 mg Intravenous Q12H  . losartan  100 mg Oral Daily  . metoprolol tartrate  100 mg Oral BID  . multivitamin with minerals  1 tablet Oral Daily  . pantoprazole  40 mg Oral BID  . potassium chloride  40 mEq Oral TID  . rosuvastatin  40 mg Oral Daily  . thiamine  100 mg Oral Daily   Or  . thiamine  100 mg Intravenous Daily  . umeclidinium bromide  1 puff Inhalation Daily   Continuous: . sodium chloride 100 mL/hr at 02/10/20 0052  . potassium PHOSPHATE IVPB (in mmol)    . thiamine injection 500 mg (02/10/20 0911)   [id number] & mag hydroxide-simeth, LORazepam **OR** LORazepam, metoprolol tartrate   ROS:                                                                                                                                       ROS was performed and is negative except as noted in HPI   Blood pressure (!) 136/95, pulse 88, temperature 97.9 F (36.6 C), temperature source Oral, resp. rate 18, height 5\' 10"  (1.778 m), weight 109.8 kg, SpO2 97 %.   General Examination:                                                                                                       Physical Exam  HEENT-  Normocephalic, no lesions, without obvious abnormality.  Normal external eye and conjunctiva.   Cardiovascular- S1-S2 audible, pulses palpable throughout   Lungs-no rhonchi or wheezing noted, no excessive work of breathing.  Saturations within normal limits Abdomen- All 4 quadrants palpated and nontender Extremities- Warm, dry and intact Musculoskeletal-no joint tenderness, deformity or swelling Skin-warm and dry, wound on forehead from fall.   Neurological Examination Mental Status: Alert, oriented to name/age/month/year, but missed day of week. Able to calculate quickly 5+3, 5x3, and how many quarters  are in 1.25. Could not spell world backwards (gave up after 1st two letters. Unable to do months of the year backwards (he spelled WORLD forwards and stated months  of year in order well). Able to state current and past president, but not further in the past than that. Thought content appropriate. Somewhat dysthymic affect. Speech fluent without evidence of aphasia.  Able to follow commands without difficulty. Naming and repetition intact. Cranial Nerves: II:  Visual fields grossly normal, PERRL.  III,IV, VI: ptosis not present, no nystagmus, EOMI V,VII: smile symmetric, facial light touch sensation normal bilaterally VIII: hearing intact to conversation IX,X: Palate rises symmetrically XI: Symmetric shoulder shrug XII: midline tongue extension Motor: Right : Upper extremity   5/5 Left:     Upper extremity   5/5  Lower extremity   4/5( hip) Lower extremity   4/5 ( hip) Knee extension and flexion 4+/5 bilaterally  Normal tone throughout; mildly decreased muscle bulk BLE Sensory: Cool temp sensation and  light touch intact throughout, bilaterally Cerebellar: No gross ataxia with FNF or H-S bilaterally. Slow and dysdiadochokinetic rapid movements. Action tremor BUE, slightly more prominent on the left. Gait: Wide-based stance with magnetic, shuffling gait. Foot elevation above floor when ambulating is < 1 inch and forward leg swing is about 2 inches per step.    Lab Results: Basic Metabolic Panel: Recent Labs  Lab 02/08/20 2015 02/08/20 2015 02/08/20 2043 02/08/20 2244 02/09/20 0252 02/09/20 0252 02/09/20 0802 02/09/20 1117 02/10/20 0436  NA 137   < > 139  --  138  --  139 139 143  K 2.1*   < > 2.0*  --  2.7*  --  4.0 3.9 3.4*  CL 94*  --   --   --  98  --  102 100 108  CO2 14*  --   --   --  18*  --  20* 22 25  GLUCOSE 159*  --   --   --  174*  --  144* 174* 144*  BUN 13  --   --   --  10  --  10 11 9   CREATININE 1.19  --   --   --  1.16  --  1.12 0.94 0.85  CALCIUM 10.2   < >  --   --  9.4   < > 9.5 9.6 8.9  MG  --   --   --  2.0  --   --   --  1.8 2.2  PHOS  --   --   --   --   --   --   --  1.4*  --    < > = values in this interval  not displayed.    CBC: Recent Labs  Lab 02/08/20 2015 02/08/20 2043 02/08/20 2244 02/09/20 1117  WBC 11.7*  --  13.4* 7.8  NEUTROABS 9.2*  --   --   --   HGB 15.1 15.6 12.6* 12.9*  HCT 46.5 46.0 38.8* 40.0  MCV 93.0  --  93.7 92.8  PLT 296  --  246 232     Imaging: CT HEAD WO CONTRAST  Result Date: 02/08/2020 CLINICAL DATA:  Post-traumatic headache. EXAM: CT HEAD WITHOUT CONTRAST TECHNIQUE: Contiguous axial images were obtained from the base of the skull through the vertex without intravenous contrast. COMPARISON:  None. FINDINGS: Brain: No evidence of acute infarction, hemorrhage, hydrocephalus, extra-axial collection or mass lesion/mass effect. Advanced atrophy and chronic microvascular ischemic changes are  noted. Vascular: No hyperdense vessel or unexpected calcification. Skull: Normal. Negative for fracture or focal lesion. Sinuses/Orbits: Mucosal thickening is noted of the left maxillary sinus. The remaining paranasal sinuses and mastoid air cells are essentially clear. Other: None. IMPRESSION: 1. No acute intracranial abnormality. 2. Advanced atrophy and chronic microvascular ischemic changes. Electronically Signed   By: Constance Holster M.D.   On: 02/08/2020 20:56   MR BRAIN WO CONTRAST  Result Date: 02/10/2020 CLINICAL DATA:  Initial evaluation for acute encephalopathy. EXAM: MRI HEAD WITHOUT CONTRAST TECHNIQUE: Multiplanar, multiecho pulse sequences of the brain and surrounding structures were obtained without intravenous contrast. COMPARISON:  Comparison made with prior head CT from 02/08/2020. FINDINGS: Brain: Diffuse prominence of the CSF containing spaces compatible with moderately advanced cerebral atrophy. Mild scattered T2/FLAIR hyperintensity within the periventricular white matter most consistent with chronic small vessel ischemic disease, minimal for age. No abnormal foci of restricted diffusion to suggest acute or subacute ischemia. Gray-white matter differentiation  maintained. No encephalomalacia to suggest chronic cortical infarction. No foci of susceptibility artifact to suggest acute or chronic intracranial hemorrhage. No mass lesion, midline shift or mass effect. Diffuse ventriculomegaly, which could be related to global parenchymal volume loss. A degree of NPH could be contributory. Periventricular FLAIR hyperintensity could in part reflect transependymal flow of CSF in that setting. No extra-axial fluid collection. Pituitary gland within normal limits. Midline structures intact. Vascular: Major intracranial vascular flow voids are maintained. Skull and upper cervical spine: Craniocervical junction within normal limits. Upper cervical spine normal. Bone marrow signal intensity within normal limits. No scalp soft tissue abnormality. Sinuses/Orbits: Globes and orbital soft tissues within normal limits. Mucosal thickening with air-fluid level present within the left maxillary sinus, consistent with acute sinusitis. Scattered mucosal thickening noted within the left ethmoidal air cells as well. No mastoid effusion. Inner ear structures grossly normal. Other: None. IMPRESSION: 1. No acute intracranial abnormality. 2. Moderately advanced cerebral and cerebellar atrophy. 3. Diffuse ventriculomegaly. While this finding is undoubtedly at least in part related to underlying global parenchymal atrophy, a degree of NPH could also be contributory in the correct clinical setting. 4. Acute left maxillary sinusitis. Electronically Signed   By: Jeannine Boga M.D.   On: 02/10/2020 01:03   DG Chest Port 1 View  Result Date: 02/08/2020 CLINICAL DATA:  Pt brought to ED by family after fall in the bathroom. Pt reports he fell around 6 or 7 or 12 today. Pt states is has been on the floor the whole time but then states his wife and neighbor got him up after the fall. Dried blood noted to R eyebrow, bruising under R eye. Pt denies blood thinners. Pt is oriented to self, place, pt states  he slipped on urine in the bathroom and hit head on commode EXAM: PORTABLE CHEST 1 VIEW COMPARISON:  12/03/2019 FINDINGS: Stable changes from prior cardiac surgery. Cardiac silhouette is normal in size. No mediastinal or hilar masses. Stable left anterior chest wall loop recorder. Clear lungs.  No pleural effusion or pneumothorax. Skeletal structures are grossly intact. IMPRESSION: No acute cardiopulmonary disease. Electronically Signed   By: Lajean Manes M.D.   On: 02/08/2020 20:58  . Laurey Morale, MSN, NP-C [facility] [phone]   Assessment: 68 year old male presenting with a 1 week history of frequent falls and AMS. MRI reveals diffuse moderate-to-severe cerebral, brainstem and cerebellar atrophy; however, ventricular dilatation may not solely be due to atrophy, with a component of normal pressure hydrocephalus (NPH) also being possible.  Additionally, symptoms as well as signs on Hospitalist's exam, but not Neurolohospitalist's exam, point to possible anterograde memory loss with confabulation, suggestive of acute thiamine deficiency for which he is being empirically treated. He has impaired concentration on exam, suggestive of anterior frontal lobe dysfunction.  1. Exam reveals no gross ataxia or nystagmus. He does have dysdiadochokinesia with RAM of hands, however, which localizes to the cerebellum and correlates with the cerebellar atrophy seen on MRI. The most prominent deficit on exam is a shuffling, magnetic gait with a wide based stance, which would correlate with the ventriculomegaly and transependymal signal changes on MRI which are strongly suggestive of NPH. Mild action tremor. Attention is good, but concentration is impaired, consistent with frontal executive dysfunction. He has relatively good insight but long term memory is somewhat impaired.   2. MRI brain: Moderately advanced cerebral and cerebellar atrophy. Diffuse ventriculomegaly. While this finding is undoubtedly  at least in part related to underlying global parenchymal atrophy, a degree of NPH could also be contributory.  3. 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 He is a regular moderate to heavy drinker and Wernicke's encephalopathy may also be contributing.    Recommendations: 1. Continue IV thiamine at 500 mg IV TID x 3 days, then switch to 100 mg po qd thereafter.  2. Plan for LP Monday. This should be a high-volume tap with opening pressure and removal of 20-30 cc of CSF, which should be sent for basic labs. Following the tap, his ambulation should be assessed with a 10 foot walk every 2 hours x 4, followed by a reassessment in 24 hours. PT should be consulted for documentation of ambulation pre-and post LP as the Neurology service is quite busy and not equipped for intensive multiple re-evaluations of ambulation in-hospital. If his ambulation significantly improves per PT after the tap, he should be referred to Neurosurgery to assess for possible placement of a ventriculoperitoneal or lumboperitoneal shunt. Please hold lovenox dose, which is due tonight, and start SCDs.   Electronically signed: Dr. Kerney Elbe 02/10/2020, 8:14 AM

## 2020-02-11 ENCOUNTER — Inpatient Hospital Stay (HOSPITAL_COMMUNITY): Payer: Medicare Other

## 2020-02-11 DIAGNOSIS — I340 Nonrheumatic mitral (valve) insufficiency: Secondary | ICD-10-CM

## 2020-02-11 DIAGNOSIS — G9340 Encephalopathy, unspecified: Secondary | ICD-10-CM

## 2020-02-11 LAB — BASIC METABOLIC PANEL
Anion gap: 12 (ref 5–15)
BUN: 9 mg/dL (ref 8–23)
CO2: 24 mmol/L (ref 22–32)
Calcium: 9.1 mg/dL (ref 8.9–10.3)
Chloride: 104 mmol/L (ref 98–111)
Creatinine, Ser: 0.73 mg/dL (ref 0.61–1.24)
GFR calc Af Amer: >60 mL/min (ref >60)
GFR calc non Af Amer: >60 mL/min (ref >60)
Glucose, Bld: 171 mg/dL — ABNORMAL HIGH (ref 70–99)
Potassium: 3.6 mmol/L (ref 3.5–5.1)
Sodium: 140 mmol/L (ref 135–145)

## 2020-02-11 LAB — PHOSPHORUS
Phosphorus: 2.2 mg/dL — ABNORMAL LOW (ref 2.5–4.6)
Phosphorus: 2.3 mg/dL — ABNORMAL LOW (ref 2.5–4.6)

## 2020-02-11 LAB — FOLATE RBC
Folate, Hemolysate: 306 ng/mL
Folate, RBC: 795 ng/mL (ref >498)
Hematocrit: 38.5 % (ref 37.5–51.0)

## 2020-02-11 LAB — ECHOCARDIOGRAM COMPLETE
Height: 70 in
Weight: 3872 oz

## 2020-02-11 LAB — PROTIME-INR
INR: 1 (ref 0.8–1.2)
Prothrombin Time: 13.1 seconds (ref 11.4–15.2)

## 2020-02-11 LAB — MAGNESIUM: Magnesium: 1.9 mg/dL (ref 1.7–2.4)

## 2020-02-11 LAB — GLUCOSE, CAPILLARY
Glucose-Capillary: 193 mg/dL — ABNORMAL HIGH (ref 70–99)
Glucose-Capillary: 230 mg/dL — ABNORMAL HIGH (ref 70–99)
Glucose-Capillary: 258 mg/dL — ABNORMAL HIGH (ref 70–99)

## 2020-02-11 LAB — CK: Total CK: 47 U/L — ABNORMAL LOW (ref 49–397)

## 2020-02-11 LAB — APTT: aPTT: 32 seconds (ref 24–36)

## 2020-02-11 MED ORDER — INSULIN ASPART 100 UNIT/ML ~~LOC~~ SOLN
0.0000 [IU] | Freq: Three times a day (TID) | SUBCUTANEOUS | Status: DC
  Administered 2020-02-11: 5 [IU] via SUBCUTANEOUS
  Administered 2020-02-11 – 2020-02-12 (×4): 3 [IU] via SUBCUTANEOUS
  Administered 2020-02-13: 5 [IU] via SUBCUTANEOUS
  Administered 2020-02-13 (×2): 2 [IU] via SUBCUTANEOUS
  Administered 2020-02-14: 1 [IU] via SUBCUTANEOUS
  Administered 2020-02-14 – 2020-02-15 (×4): 2 [IU] via SUBCUTANEOUS
  Administered 2020-02-15: 1 [IU] via SUBCUTANEOUS
  Administered 2020-02-16: 3 [IU] via SUBCUTANEOUS
  Administered 2020-02-16: 2 [IU] via SUBCUTANEOUS
  Administered 2020-02-16: 3 [IU] via SUBCUTANEOUS
  Administered 2020-02-17 – 2020-02-18 (×5): 2 [IU] via SUBCUTANEOUS

## 2020-02-11 MED ORDER — MAGNESIUM SULFATE 2 GM/50ML IV SOLN
2.0000 g | Freq: Once | INTRAVENOUS | Status: AC
  Administered 2020-02-11: 2 g via INTRAVENOUS
  Filled 2020-02-11: qty 50

## 2020-02-11 MED ORDER — LORAZEPAM 2 MG/ML IJ SOLN: 1.0000 mg | Freq: Four times a day (QID) | INTRAMUSCULAR | Status: DC

## 2020-02-11 MED ORDER — POTASSIUM CHLORIDE CRYS ER 20 MEQ PO TBCR
20.0000 meq | EXTENDED_RELEASE_TABLET | Freq: Two times a day (BID) | ORAL | Status: DC
  Administered 2020-02-11 – 2020-02-17 (×13): 20 meq via ORAL
  Filled 2020-02-11 (×13): qty 1

## 2020-02-11 MED ORDER — SODIUM CHLORIDE 0.9 % IV SOLN: INTRAVENOUS | Status: DC

## 2020-02-11 MED ORDER — SODIUM CHLORIDE 0.9 % IV BOLUS
500.0000 mL | Freq: Once | INTRAVENOUS | Status: AC
  Administered 2020-02-11: 500 mL via INTRAVENOUS

## 2020-02-11 MED ORDER — DILTIAZEM LOAD VIA INFUSION
10.0000 mg | Freq: Once | INTRAVENOUS | Status: AC
  Administered 2020-02-11: 10 mg via INTRAVENOUS
  Filled 2020-02-11: qty 10

## 2020-02-11 MED ORDER — DEXTROSE-NACL 5-0.45 % IV SOLN: INTRAVENOUS | Status: DC

## 2020-02-11 MED ORDER — LORAZEPAM 1 MG PO TABS
1.0000 mg | ORAL_TABLET | Freq: Three times a day (TID) | ORAL | Status: DC
  Administered 2020-02-11 – 2020-02-12 (×2): 1 mg via ORAL
  Filled 2020-02-11 (×2): qty 1

## 2020-02-11 MED ORDER — DILTIAZEM HCL-DEXTROSE 125-5 MG/125ML-% IV SOLN (PREMIX)
5.0000 mg/h | INTRAVENOUS | Status: DC
  Administered 2020-02-11: 10 mg/h via INTRAVENOUS
  Administered 2020-02-12 – 2020-02-13 (×2): 5 mg/h via INTRAVENOUS
  Administered 2020-02-14: 10 mg/h via INTRAVENOUS
  Filled 2020-02-11 (×3): qty 125

## 2020-02-11 MED ORDER — METOPROLOL TARTRATE 5 MG/5ML IV SOLN
5.0000 mg | INTRAVENOUS | Status: AC | PRN
  Administered 2020-02-11 (×2): 5 mg via INTRAVENOUS
  Filled 2020-02-11: qty 5

## 2020-02-11 NOTE — Progress Notes (Signed)
Physical Therapy Treatment Patient Details Name: Adam [name] MRN: [id number] DOB: 06/11/1951 Today's Date: 02/11/2020    History of Present Illness 69 y.o. male with history of diabetes mellitus type 2, CAD status post CABG, A. fib not on anticoagulation probably from risk of falls with history of hypertension alcohol abuse presents to the ER the patient had a fall at home. Pt with frequent falls recently and drinking every day. Pt in afib with RVR and DKA in ED. CIWA protocol initiated.    PT Comments    Pt presenting with increased confusion, decreased activity tolerance and requiring increased mobility assist as compared to 5/8 PT eval.  Pt received in bed, soiled and unaware he had a BM. He required min assist bed mobility, min assist sit to stand with RW and min assist +2 safety pivot transfer with RW bed <> recliner. Pt requiring continuous verbal cues for safety. Notably fatigue following transfers and time on [facility]. Unable to progress gait this session. Pt returned to bed for ECHO. Current recommendation is for HHPT. Will need to consider ST SNF, if downward safety/mobility trend continues.   Follow Up Recommendations  Home health PT;Supervision/Assistance - 24 hour     Equipment Recommendations  3in1 (PT)    Recommendations for Other Services       Precautions / Restrictions Precautions Precautions: Fall Precaution Comments: CIWA    Mobility  Bed Mobility Overal bed mobility: Needs Assistance Bed Mobility: Supine to Sit;Sit to Supine     Supine to sit: HOB elevated;Mod assist Sit to supine: Min assist   General bed mobility comments: cues for sequencing. Assist to elevate trunk. Assist with BLE in/out of bed.  Transfers Overall transfer level: Needs assistance Equipment used: Rolling walker (2 wheeled) Transfers: Sit to/from [facility] Sit to Stand: Min assist Stand pivot transfers: Min assist;+2 safety/equipment       General transfer  comment: Impulsive with decreased safety awareness. Continuous cues for sequencing, safety and to stay on task. Assist to power up and pivot. Pt transferred to/from [facility].  Ambulation/Gait             General Gait Details: unable to progress gait today due to fatigue/confusion.   Stairs             Wheelchair Mobility    Modified Rankin (Stroke Patients Only)       Balance Overall balance assessment: Needs assistance Sitting-balance support: No upper extremity supported;Feet supported Sitting balance-Leahy Scale: Fair Sitting balance - Comments: close supervision at edge of bed   Standing balance support: Bilateral upper extremity supported;During functional activity Standing balance-Leahy Scale: Poor Standing balance comment: reliant on external support                            Cognition Arousal/Alertness: Awake/alert Behavior During Therapy: Impulsive;Flat affect Overall Cognitive Status: Impaired/Different from baseline Area of Impairment: Orientation;Attention;Memory;Safety/judgement;Awareness;Problem solving                 Orientation Level: Disoriented to;Place;Time;Situation Current Attention Level: Sustained Memory: Decreased short-term memory   Safety/Judgement: Decreased awareness of safety Awareness: Intellectual Problem Solving: Slow processing;Difficulty sequencing;Requires verbal cues General Comments: Pt soiled in bed on arrival. Pt unaware he had a BM. Pt impulsive. Requiring continuous verbal cues for safety/stay on task.      Exercises      General Comments General comments (skin integrity, edema, etc.): Pt with sustained tachycardia this AM (151). RN administered  meds. HR 83 at time of PT session.      Pertinent Vitals/Pain Pain Assessment: Faces Faces Pain Scale: Hurts little more Pain Location: bottom after BM Pain Descriptors / Indicators: Discomfort;Tender;Grimacing;Moaning Pain Intervention(s): Monitored  during session;Repositioned    Home Living                      Prior Function            PT Goals (current goals can now be found in the care plan section) Acute Rehab PT Goals Patient Stated Goal: not stated Progress towards PT goals: Not progressing toward goals - comment(increased confusion, medical issues)    Frequency    Min 3X/week      PT Plan Current plan remains appropriate    Co-evaluation              AM-PAC PT "6 Clicks" Mobility   Outcome Measure  Help needed turning from your back to your side while in a flat bed without using bedrails?: A Little Help needed moving from lying on your back to sitting on the side of a flat bed without using bedrails?: A Little Help needed moving to and from a bed to a chair (including a wheelchair)?: A Little Help needed standing up from a chair using your arms (e.g., wheelchair or bedside chair)?: A Little Help needed to walk in hospital room?: A Lot Help needed climbing 3-5 steps with a railing? : A Lot 6 Click Score: 16    End of Session Equipment Utilized During Treatment: Gait belt Activity Tolerance: Patient limited by fatigue Patient left: in bed;with call bell/phone within reach;with bed alarm set Nurse Communication: Mobility status PT Visit Diagnosis: Unsteadiness on feet (R26.81);Other symptoms and signs involving the nervous system (R29.898)     Time: [id number] PT Time Calculation (min) (ACUTE ONLY): 39 min  Charges:  $Gait Training: 8-22 mins $Therapeutic Activity: 23-37 mins                     Lorrin Goodell, [place]  Office # [phone] Pager [place], [place] 02/11/2020, 12:22 PM

## 2020-02-11 NOTE — Progress Notes (Signed)
  Echocardiogram 2D Echocardiogram has been performed.  Jennette Dubin 02/11/2020, 12:09 PM

## 2020-02-11 NOTE — TOC Progression Note (Signed)
Transition of Care [facility]) - Progression Note    Patient Details  Name: ADEIN TENOLD MRN: [id number] Date of Birth: 1951-09-17  Transition of Care [facility] CM/SW Contact  Zenon Mayo, RN Phone Number: 02/11/2020, 4:49 PM  Clinical Narrative:    NCM spoke with wife, she states she spoke with MD today and was told patient will need to go to rehab.          Expected Discharge Plan and Services                                                 Social Determinants of Health (SDOH) Interventions    Readmission Risk Interventions No flowsheet data found.

## 2020-02-11 NOTE — Consult Note (Addendum)
Progress note       NEUROLOGY PROGRESS NOTE   Subjective: Patient extremely confused.  He is able to follow commands however slightly agitated.  Discussion was had with wife in the room.  She states that patient wakes up in the morning and starts to drink alcohol.  She has to leave the house to go to work and has no idea how much she is drinking during the day.  She states he at least has a couple shots and a couple glasses of wine while she is at home.  She states "he is a very heavy drinker daily".  His primary doctor has attempted to help him with his drinking issues however, patient is not willing to go to AA and or use medications.  Exam: Vitals:   02/11/20 1130 02/11/20 1233  BP: (!) 121/96 (!) 148/90  Pulse:  61  Resp:  16  Temp:  98.2 F (36.8 C)  SpO2:  95%   Physical Exam  Constitutional: Appears well-developed and well-nourished.  Psych: Affect appropriate to situation Eyes: No scleral injection HENT: No OP obstrucion Head: Normocephalic.  Cardiovascular: pulses palpable  Respiratory: Effort normal, non-labored breathing GI: Soft.  No distension. There is no tenderness.  Skin: WDI   Neuro:  Mental Status: Patient is alert, he is not oriented to where he is.  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  He thinks it is 1951.  He is able to show me his thumb and follow simple commands.  No dysarthria or aphasia Cranial Nerves: II:  Visual fields grossly normal,  III,IV, VI: ptosis not present, extra-ocular motions intact bilaterally pupils equal, round, reactive to light and accommodation, no nystagmus noted V,VII: smile symmetric, facial light touch sensation normal bilaterally VIII: hearing normal bilaterally IX,X: Palate rises midline XI: bilateral shoulder shrug XII: midline tongue extension Motor: Right : Upper extremity   5/5    Left:     Upper extremity   5/5  Lower extremity   5/5     Lower extremity   5/5 All movements were  tremulous Sensory: Sensory exam was very difficult to assess as patient continued to either fall asleep.  In addition when he was awake he was not focused on the exam.  I did obtain the fact temperature change was at the mid thigh.  He did also have problems with proprioception of his toes. Deep Tendon Reflexes: 2+ and symmetric throughout Plantars: Right: downgoing   Left: downgoing Cerebellar: normal finger-to-nose,  normal heel-to-shin test Gait: Unable to be obtained.  However physical therapy did attempt to get patient up and walk.  They stated that he was so unstable he was only able to transfer to commode.    Medications:  Scheduled: . allopurinol  300 mg Oral Daily  . aspirin  81 mg Oral Daily  . dofetilide  125 mcg Oral BID  . DULoxetine  60 mg Oral Daily  . enoxaparin (LOVENOX) injection  40 mg Subcutaneous Q24H  . feeding supplement (ENSURE ENLIVE)  237 mL Oral BID BM  . fenofibrate  160 mg Oral Daily  . fluticasone furoate-vilanterol  1 puff Inhalation Daily  . folic acid  1 mg Oral Daily  . icosapent Ethyl  2 g Oral BID  . insulin aspart  0-9 Units Subcutaneous TID WC  . insulin detemir  5 Units Subcutaneous Daily  . LORazepam  0-4 mg Intravenous Q12H  . losartan  100 mg Oral Daily  .  metoprolol tartrate  100 mg Oral BID  . multivitamin with minerals  1 tablet Oral Daily  . pantoprazole  40 mg Oral BID  . potassium chloride  20 mEq Oral BID  . rosuvastatin  40 mg Oral Daily  . thiamine  100 mg Oral Daily   Or  . thiamine  100 mg Intravenous Daily  . umeclidinium bromide  1 puff Inhalation Daily   Continuous: . dextrose 5 % and 0.45% NaCl Stopped (02/11/20 1133)  . diltiazem (CARDIZEM) infusion Stopped (02/11/20 1139)   [id number] & mag hydroxide-simeth, LORazepam **OR** LORazepam, metoprolol tartrate  Pertinent Labs/Diagnostics:   MR BRAIN WO CONTRAST  Result Date: 02/10/2020  IMPRESSION: 1. No acute intracranial abnormality. 2. Moderately advanced cerebral  and cerebellar atrophy. 3. Diffuse ventriculomegaly. While this finding is undoubtedly at least in part related to underlying global parenchymal atrophy, a degree of NPH could also be contributory in the correct clinical setting. 4. Acute left maxillary sinusitis. Electronically Signed   By: Jeannine Boga M.D.   On: 02/10/2020 01:03    Assessment:  69 year old male that presented to the hospital with a 1 week history of frequent falls with MRI showing diffuse moderate to severe cerebral, brainstem and cerebellar atrophy.  There was thought that there is possible component of normal pressure hydrocephalus secondary to the large ventricles and exam showing staggering gait, memory issues along with incontinence.  LP was to be performed today however physical therapy was unable do initial exam thus, LP was held.  Given history from wife that patient has significant alcohol abuse and this has been going on for quite a while, patient's exam may be multifactorial with component of NPH and also effects of long-term drinking causing both central gait disturbance secondary cerebellar atrophy and peripheral neuropathy.  In addition, memory issues could also be attributed to Warnicke's encephalopathy.  On exam today patient is confused, has significant tremor and slightly agitated.  Impression:  -B12 deficiency -Very likely alcohol withdrawal at this point as he is now 3 days out -Questionable normal pressure hydrocephalus, unfortunately LP was not able to be obtained at this point due to inability for PT to do primary evaluation. 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   Etta Quill PA-C [facility] [phone]  02/11/2020, 1:14 PM   I have seen the patient reviewed the above note.  I asked him about  double vision and he does endorse that over the past few days.  At this point, I think that he is more likely Wernicke's plus alcohol withdrawal then normal pressure hydrocephalus.  On review of his imaging, he has significant cerebellar atrophy and I think that NPH is relatively unlikely.  That being said, further evaluation would be a large volume LP but given his current delirium, I do not think that this evaluation can be undertaken at the current time.  If he improves to the point where this could be undertaken, then a trial could be considered, but I think that this would be as an outpatient after he has had time to fully recover from his current inpatient hospitalization.  Roland Rack, MD [facility] [phone]  If 7pm- 7am, please page neurology on call as listed in [place].

## 2020-02-11 NOTE — Plan of Care (Signed)
  Problem: Education: Goal: Knowledge of General Education information will improve Description Including pain rating scale, medication(s)/side effects and non-pharmacologic comfort measures Outcome: Progressing   Problem: Health Behavior/Discharge Planning: Goal: Ability to manage health-related needs will improve Outcome: Progressing   

## 2020-02-11 NOTE — Progress Notes (Addendum)
[facility] PROGRESS NOTE    Progress Note  Adam [name]  [id number] DOB: March 24, 1951 DOA: 02/08/2020 PCP: [name], MD     Brief Narrative:   Adam [name] is an 69 y.o. male past medical history significant for diabetes mellitus type 2 CAD status post CABG, atrial fibrillation not on anticoagulation due to multiple falls, essential hypertension alcohol abuse comes into the ED for frequent falls, he denies any chest pain shortness of breath nausea vomiting or diarrhea he was found in the ED in A. fib with RVR  Assessment/Plan:   High anion gap metabolic acidosis likely due to starvation alcoholic ketosis and or DKA: Glucose has been ranging 1 59-1 44, but he has not had a meal.  Restart him on gentle IV fluid hydration. Continue low-dose long-acting insulin A1c of 6.6, last blood glucose yesterday was 195.  Possible Wernicke's/Korsakoff  encephalopathy versus normal pressure hydrocephalus: Has nystagmus on physical exam with gait imbalance and encephalopathy. He is having also episodes of retrograde amnesia, speaking to the wife she tells me that he has been confabulating over the last several days, has been having episodes of incontinence at home. Has a white base at home MRI showed ventriculomegaly and severe atrophy.   Consulted neurology recommended LP, holding anticoagulation.  Alcohol withdrawals: The nurse relate the patient was confused and combative yesterday, score was about 9 he received Ativan and he became more calm. Continue thiamine and folate and Ativan per protocol.  Atrial fibrillation with RVR ([place]): New onset of A. fib with RVR, he is on Tikosyn not on anticoagulation due to history of multiple falls and alcohol abuse. This morning he was ranging greater than 100-1 50, continue oral Tikosyn Toprol all, he was given a dose of IV metoprolol this morning. Heart rate is hard to control continues to be above 100 started on diltiazem drip  consult cardiology.  Electrolyte imbalance: Try to keep magnesium greater than 2 potassium greater than 4 phosphorus greater than 3. All of them have been repleted IV, check a CK.  CAD (coronary artery disease): Continue metoprolol and aspirin denies any chest pain.  COPD: Noted.   Small laceration on the right side of the forehead: No active bleeding CT of the head unremarkable.  Essential hypertension: Continue Cozaar and metoprolol.  Severe caloric malnutrition: Continue Ensure 3 times daily his percentage meal consumption is less than 10%.    DVT prophylaxis: lovenox Family Communication:none Status is: Inpatient  Remains inpatient appropriate because:Hemodynamically unstable   Dispo: The patient is from: Home              Anticipated d/c is to: Home              Anticipated d/c date is: 3 day              Patient currently is not medically stable to d/c.   Code Status:     Code Status Orders  (From admission, onward)         Start     Ordered   02/08/20 2245  Full code  Continuous     02/08/20 2245        Code Status History    Date Active Date Inactive Code Status Order ID Comments User Context   10/25/2019 0015 10/27/2019 2015 Full Code [id number]  [name], MD ED   09/12/2018 1829 09/13/2018 1905 Full Code [id number]  [name] Inpatient   03/31/2018 1630 04/01/2018 0015 Full Code  [id number]  [name], MD Inpatient   12/12/2017 1624 12/18/2017 1801 Full Code [id number]  [name], MD Inpatient   12/12/2017 1622 12/12/2017 1624 Full Code [id number]  [name], MD Inpatient   Advance Care Planning Activity        IV Access:    Peripheral IV   Procedures and diagnostic studies:   MR BRAIN WO CONTRAST  Result Date: 02/10/2020 CLINICAL DATA:  Initial evaluation for acute encephalopathy. EXAM: MRI HEAD WITHOUT CONTRAST TECHNIQUE: Multiplanar, multiecho pulse sequences of the brain and surrounding structures were  obtained without intravenous contrast. COMPARISON:  Comparison made with prior head CT from 02/08/2020. FINDINGS: Brain: Diffuse prominence of the CSF containing spaces compatible with moderately advanced cerebral atrophy. Mild scattered T2/FLAIR hyperintensity within the periventricular white matter most consistent with chronic small vessel ischemic disease, minimal for age. No abnormal foci of restricted diffusion to suggest acute or subacute ischemia. Gray-white matter differentiation maintained. No encephalomalacia to suggest chronic cortical infarction. No foci of susceptibility artifact to suggest acute or chronic intracranial hemorrhage. No mass lesion, midline shift or mass effect. Diffuse ventriculomegaly, which could be related to global parenchymal volume loss. A degree of NPH could be contributory. Periventricular FLAIR hyperintensity could in part reflect transependymal flow of CSF in that setting. No extra-axial fluid collection. Pituitary gland within normal limits. Midline structures intact. Vascular: Major intracranial vascular flow voids are maintained. Skull and upper cervical spine: Craniocervical junction within normal limits. Upper cervical spine normal. Bone marrow signal intensity within normal limits. No scalp soft tissue abnormality. Sinuses/Orbits: Globes and orbital soft tissues within normal limits. Mucosal thickening with air-fluid level present within the left maxillary sinus, consistent with acute sinusitis. Scattered mucosal thickening noted within the left ethmoidal air cells as well. No mastoid effusion. Inner ear structures grossly normal. Other: None. IMPRESSION: 1. No acute intracranial abnormality. 2. Moderately advanced cerebral and cerebellar atrophy. 3. Diffuse ventriculomegaly. While this finding is undoubtedly at least in part related to underlying global parenchymal atrophy, a degree of NPH could also be contributory in the correct clinical setting. 4. Acute left  maxillary sinusitis. Electronically Signed   By: Jeannine Boga M.D.   On: 02/10/2020 01:03   CUP PACEART REMOTE DEVICE CHECK  Result Date: 02/10/2020 [facility] summary report received. Battery status OK. Normal device function. No new symptom episodes, 2 tachy episodes previously reviewed/reported as AF w/ RVR w/ max VR 240bpm. No brady, or pause episodes. AF burden 5.1% w/ longest last 90 days 2 hours. Known PAF on tikosyn and lopressor, no OAC noted. Was seen in ED 02/08/20. Monthly summary reports and ROV/PRN [facility]:    None.  Anti-Infectives:   none  Subjective:    Ashby Dawes Hoey not hungry this morning had a bowel movement his heart rate was ranging around 150 denies any chest pain or shortness of breath.  Objective:    Vitals:   02/11/20 0428 02/11/20 0627 02/11/20 0645 02/11/20 0700  BP: (!) 138/95 (!) 146/105 (!) 151/113 (!) 129/106  Pulse: 60 (!) 160 (!) 164 (!) 160  Resp: 20 16    Temp: 97.8 F (36.6 C) 97.6 F (36.4 C)    TempSrc: Oral Oral    SpO2: 95% 96%    Weight: 109.8 kg     Height:       SpO2: 96 %   Intake/Output Summary (Last 24 hours) at 02/11/2020 0749 Last data filed at 02/11/2020 0519 Gross per 24 hour  Intake 1846 ml  Output 275 ml  Net 1571 ml   Filed Weights   02/09/20 0249 02/10/20 0430 02/11/20 0428  Weight: 105.7 kg 109.8 kg 109.8 kg    Exam: General exam: In no acute distress. Respiratory system: Good air movement and clear to auscultation. Cardiovascular system: S1 & S2 heard, RRR. No JVD. Gastrointestinal system: Abdomen is nondistended, soft and nontender.  Skin: No rashes, lesions or ulcers Psychiatry: No Judgement and insight appear normal. Data Reviewed:    Labs: Basic Metabolic Panel: Recent Labs  Lab 02/08/20 2015 02/08/20 2244 02/09/20 0252 02/09/20 0252 02/09/20 0802 02/09/20 0802 02/09/20 1117 02/09/20 1117 02/10/20 0436 02/10/20 1634  NA   < >  --  138  --  139  --  139   --  143 144  K   < >  --  2.7*   < > 4.0   < > 3.9   < > 3.4* 3.9  CL   < >  --  98  --  102  --  100  --  108 108  CO2   < >  --  18*  --  20*  --  22  --  25 27  GLUCOSE   < >  --  174*  --  144*  --  174*  --  144* 195*  BUN   < >  --  10  --  10  --  11  --  9 11  CREATININE   < >  --  1.16  --  1.12  --  0.94  --  0.85 0.79  CALCIUM   < >  --  9.4  --  9.5  --  9.6  --  8.9 9.1  MG  --  2.0  --   --   --   --  1.8  --  2.2  --   PHOS  --   --   --   --   --   --  1.4*  --   --   --    < > = values in this interval not displayed.   GFR Estimated Creatinine Clearance: 108.1 mL/min (by C-G formula based on SCr of 0.79 mg/dL). Liver Function Tests: Recent Labs  Lab 02/08/20 2015 02/09/20 1117  AST 39 31  ALT 29 25  ALKPHOS 80 63  BILITOT 1.7* 1.6*  PROT 8.2* 6.0*  ALBUMIN 4.1 3.3*   Recent Labs  Lab 02/08/20 2015  LIPASE 61*   No results for input(s): AMMONIA in the last 168 hours. Coagulation profile No results for input(s): INR, PROTIME in the last 168 hours. COVID-19 Labs  No results for input(s): DDIMER, FERRITIN, LDH, CRP in the last 72 hours.  Lab Results  Component Value Date   SARSCOV2NAA NEGATIVE 02/08/2020   [place] NEGATIVE 10/24/2019    CBC: Recent Labs  Lab 02/08/20 2015 02/08/20 2043 02/08/20 2244 02/09/20 1117  WBC 11.7*  --  13.4* 7.8  NEUTROABS 9.2*  --   --   --   HGB 15.1 15.6 12.6* 12.9*  HCT 46.5 46.0 38.8* 40.0  MCV 93.0  --  93.7 92.8  PLT 296  --  246 232   Cardiac Enzymes: No results for input(s): CKTOTAL, CKMB, CKMBINDEX, TROPONINI in the last 168 hours. BNP (last 3 results) Recent Labs    12/03/19 1502  PROBNP 266.0*   CBG: Recent Labs  Lab 02/08/20 2001  GLUCAP 145*   D-Dimer:  No results for input(s): DDIMER in the last 72 hours. Hgb A1c: Recent Labs    02/10/20 0844  HGBA1C 6.6*   Lipid Profile: No results for input(s): CHOL, HDL, LDLCALC, TRIG, CHOLHDL, LDLDIRECT in the last 72 hours. Thyroid function  studies: Recent Labs    02/08/20 2245  TSH 3.137   Anemia work up: Recent Labs    02/09/20 1117  VITAMINB12 353   Sepsis Labs: Recent Labs  Lab 02/08/20 2015 02/08/20 2222 02/08/20 2244 02/09/20 0802 02/09/20 1117  WBC 11.7*  --  13.4*  --  7.8  LATICACIDVEN  --  2.3*  --  1.3 1.9   Microbiology Recent Results (from the past 240 hour(s))  Respiratory Panel by RT PCR (Flu A&B, Covid) - Nasopharyngeal Swab     Status: None   Collection Time: 02/08/20 10:26 PM   Specimen: Nasopharyngeal Swab  Result Value Ref Range Status   SARS Coronavirus 2 by RT PCR NEGATIVE NEGATIVE Final    Comment: (NOTE) SARS-CoV-2 target nucleic acids are NOT DETECTED. The SARS-CoV-2 RNA is generally detectable in upper respiratoy specimens during the acute phase of infection. The lowest concentration of SARS-CoV-2 viral copies this assay can detect is 131 copies/mL. A negative result does not preclude SARS-Cov-2 infection and should not be used as the sole basis for treatment or other patient management decisions. A negative result may occur with  improper specimen collection/handling, submission of specimen other than nasopharyngeal swab, presence of viral mutation(s) within the areas targeted by this assay, and inadequate number of viral copies (<131 copies/mL). A negative result must be combined with clinical observations, patient history, and epidemiological information. The expected result is Negative. Fact Sheet for Patients:  PinkCheek.be Fact Sheet for Healthcare Providers:  GravelBags.it This test is not yet ap proved or cleared by the [place] FDA and  has been authorized for detection and/or diagnosis of SARS-CoV-2 by FDA under an Emergency Use Authorization (EUA). This EUA will remain  in effect (meaning this test can be used) for the duration of the COVID-19 declaration under Section 564(b)(1) of the Act, 21  U.S.C. section 360bbb-3(b)(1), unless the authorization is terminated or revoked sooner.    Influenza A by PCR NEGATIVE NEGATIVE Final   Influenza B by PCR NEGATIVE NEGATIVE Final    Comment: (NOTE) The Xpert Xpress SARS-CoV-2/FLU/RSV assay is intended as an aid in  the diagnosis of influenza from Nasopharyngeal swab specimens and  should not be used as a sole basis for treatment. Nasal washings and  aspirates are unacceptable for Xpert Xpress SARS-CoV-2/FLU/RSV  testing. Fact Sheet for Patients: PinkCheek.be Fact Sheet for Healthcare Providers: GravelBags.it This test is not yet approved or cleared by the [place] FDA and  has been authorized for detection and/or diagnosis of SARS-CoV-2 by  FDA under an Emergency Use Authorization (EUA). This EUA will remain  in effect (meaning this test can be used) for the duration of the  Covid-19 declaration under Section 564(b)(1) of the Act, 21  U.S.C. section 360bbb-3(b)(1), unless the authorization is  terminated or revoked. Performed at [facility], [place] [address], [place],  [address]      Medications:   . allopurinol  300 mg Oral Daily  . aspirin  81 mg Oral Daily  . dofetilide  125 mcg Oral BID  . DULoxetine  60 mg Oral Daily  . enoxaparin (LOVENOX) injection  40 mg Subcutaneous Q24H  . feeding supplement (ENSURE ENLIVE)  237 mL Oral BID BM  .  fenofibrate  160 mg Oral Daily  . fluticasone furoate-vilanterol  1 puff Inhalation Daily  . folic acid  1 mg Oral Daily  . icosapent Ethyl  2 g Oral BID  . insulin detemir  5 Units Subcutaneous Daily  . LORazepam  0-4 mg Intravenous Q6H   Followed by  . LORazepam  0-4 mg Intravenous Q12H  . losartan  100 mg Oral Daily  . metoprolol tartrate  100 mg Oral BID  . multivitamin with minerals  1 tablet Oral Daily  . pantoprazole  40 mg Oral BID  . potassium chloride  20 mEq Oral BID  . rosuvastatin  40 mg Oral  Daily  . thiamine  100 mg Oral Daily   Or  . thiamine  100 mg Intravenous Daily  . umeclidinium bromide  1 puff Inhalation Daily   Continuous Infusions: . thiamine injection 500 mg (02/10/20 0911)      LOS: 3 days   Charlynne Cousins  [facility]  02/11/2020, 7:49 AM

## 2020-02-11 NOTE — Progress Notes (Signed)
   02/11/20 0746  Assess: MEWS Score  ECG Heart Rate (!) 151  Assess: MEWS Score  MEWS Temp 0  MEWS Systolic 0  MEWS Pulse 3  MEWS RR 0  MEWS LOC 0  MEWS Score 3  MEWS Score Color Yellow  Assess: if the MEWS score is Yellow or Red  Were vital signs taken at a resting state? Yes  Focused Assessment Documented focused assessment  Early Detection of Sepsis Score *See Row Information* Low  MEWS guidelines implemented *See Row Information* Yes  Treat  MEWS Interventions Administered prn meds/treatments  Take Vital Signs  Increase Vital Sign Frequency  Yellow: Q 2hr X 2 then Q 4hr X 2, if remains yellow, continue Q 4hrs  Escalate  MEWS: Escalate Yellow: discuss with charge nurse/RN and consider discussing with provider and RRT  Notify: Charge Nurse/RN  Name of Charge Nurse/RN Notified Shirlee Limerick RN  Date Charge Nurse/RN Notified 02/11/20  Time Charge Nurse/RN Notified 0800  Notify: Provider  Provider Name/Title Aileen Fass MD  Date Provider Notified 02/11/20  Time Provider Notified 0800  Notification Type Page  Notification Reason Change in status  Response See new orders  Date of Provider Response 02/11/20  Time of Provider Response 0800

## 2020-02-11 NOTE — Progress Notes (Signed)
This note also relates to the following rows which could not be included: ECG Heart Rate - Cannot attach notes to unvalidated device data    02/11/20 0617  Assess: if the MEWS score is Yellow or Red  Were vital signs taken at a resting state? Yes  Focused Assessment Documented focused assessment  Early Detection of Sepsis Score *See Row Information* Low  MEWS guidelines implemented *See Row Information* Yes  Treat  MEWS Interventions Administered scheduled meds/treatments;Administered prn meds/treatments;Escalated (See documentation below)  Take Vital Signs  Increase Vital Sign Frequency  Yellow: Q 2hr X 2 then Q 4hr X 2, if remains yellow, continue Q 4hrs  Escalate  MEWS: Escalate Yellow: discuss with charge nurse/RN and consider discussing with provider and RRT  Notify: Provider  Provider Name/Title Sharlet Salina  Date Provider Notified 02/11/20  Time Provider Notified [phone]  Notification Type Page  Notification Reason Change in status  Yellow MEWS protocol started.

## 2020-02-12 ENCOUNTER — Ambulatory Visit (INDEPENDENT_AMBULATORY_CARE_PROVIDER_SITE_OTHER): Payer: Medicare Other | Admitting: *Deleted

## 2020-02-12 DIAGNOSIS — I4891 Unspecified atrial fibrillation: Secondary | ICD-10-CM | POA: Diagnosis not present

## 2020-02-12 LAB — BASIC METABOLIC PANEL
Anion gap: 11 (ref 5–15)
BUN: 10 mg/dL (ref 8–23)
CO2: 25 mmol/L (ref 22–32)
Calcium: 9.1 mg/dL (ref 8.9–10.3)
Chloride: 106 mmol/L (ref 98–111)
Creatinine, Ser: 0.77 mg/dL (ref 0.61–1.24)
GFR calc Af Amer: >60 mL/min (ref >60)
GFR calc non Af Amer: >60 mL/min (ref >60)
Glucose, Bld: 229 mg/dL — ABNORMAL HIGH (ref 70–99)
Potassium: 4 mmol/L (ref 3.5–5.1)
Sodium: 142 mmol/L (ref 135–145)

## 2020-02-12 LAB — GLUCOSE, CAPILLARY
Glucose-Capillary: 230 mg/dL — ABNORMAL HIGH (ref 70–99)
Glucose-Capillary: 232 mg/dL — ABNORMAL HIGH (ref 70–99)
Glucose-Capillary: 212 mg/dL — ABNORMAL HIGH (ref 70–99)
Glucose-Capillary: 212 mg/dL — ABNORMAL HIGH (ref 70–99)

## 2020-02-12 LAB — AMMONIA: Ammonia: 46 umol/L — ABNORMAL HIGH (ref 9–35)

## 2020-02-12 LAB — MAGNESIUM: Magnesium: 1.9 mg/dL (ref 1.7–2.4)

## 2020-02-12 MED ORDER — LORAZEPAM 1 MG PO TABS
1.0000 mg | ORAL_TABLET | Freq: Four times a day (QID) | ORAL | Status: DC
  Administered 2020-02-12 – 2020-02-13 (×3): 1 mg via ORAL
  Filled 2020-02-12 (×3): qty 1

## 2020-02-12 MED ORDER — SODIUM CHLORIDE 0.9% FLUSH
10.0000 mL | INTRAVENOUS | Status: DC | PRN
  Administered 2020-02-16: 10 mL

## 2020-02-12 MED ORDER — MAGNESIUM SULFATE 2 GM/50ML IV SOLN
2.0000 g | Freq: Once | INTRAVENOUS | Status: AC
  Administered 2020-02-12: 2 g via INTRAVENOUS
  Filled 2020-02-12: qty 50

## 2020-02-12 MED ORDER — INSULIN DETEMIR 100 UNIT/ML ~~LOC~~ SOLN
5.0000 [IU] | Freq: Two times a day (BID) | SUBCUTANEOUS | Status: DC
  Administered 2020-02-12 – 2020-02-13 (×3): 5 [IU] via SUBCUTANEOUS
  Filled 2020-02-12 (×4): qty 0.05

## 2020-02-12 MED ORDER — K PHOS MONO-SOD PHOS DI & MONO 155-852-130 MG PO TABS
500.0000 mg | ORAL_TABLET | Freq: Three times a day (TID) | ORAL | Status: AC
  Administered 2020-02-12: 500 mg via ORAL
  Filled 2020-02-12 (×3): qty 2

## 2020-02-12 MED ORDER — SODIUM CHLORIDE 0.9% FLUSH
10.0000 mL | Freq: Two times a day (BID) | INTRAVENOUS | Status: DC
  Administered 2020-02-12 – 2020-02-18 (×11): 10 mL
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
## 2020-02-12 NOTE — Progress Notes (Signed)
Stopped Diltiazem drip at 1500 per Aileen Fass, MD order due to patient HR 40-50's sustained.

## 2020-02-12 NOTE — Progress Notes (Signed)
[facility] PROGRESS NOTE    Progress Note  Adam [name]  [id number] DOB: July 12, 1951 DOA: 02/08/2020 PCP: [name], MD     Brief Narrative:   [name] is an 69 y.o. male past medical history significant for diabetes mellitus type 2 CAD status post CABG, atrial fibrillation not on anticoagulation due to multiple falls, essential hypertension alcohol abuse comes into the ED for frequent falls, he denies any chest pain shortness of breath nausea vomiting or diarrhea he was found in the ED in A. fib with RVR  Assessment/Plan:   High anion gap metabolic acidosis likely due to starvation alcoholic ketosis and or DKA: He is on D5 half-normal saline as his consumption has been 0, will continue current fluids. His starvation [place] has resolved. We will continue long-acting insulin plus sliding scale. His A1c was 6.6.  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 He is having also episodes of retrograde amnesia, speaking to the wife she tells me that he has been confabulating over the last several days, has been having episodes of incontinence at home. Has a white base at home MRI showed ventriculomegaly and severe atrophy.   Consulted neurology recommended LP, LP could not be done due to his delirium/alcohol withdrawal.,  Resume prophylactic Lovenox he is not a candidate for anticoagulation due to his alcohol abuse and multiple episodes of fall.  Alcohol withdrawals: We will continue his scheduled Ativan in addition to the CIWA protocol.  His CIWA score continues to be a time greater than 10. Continue thiamine and folate and Ativan per protocol.  Atrial fibrillation with RVR ([place]): New onset of A. fib with RVR, he is on Tikosyn not on anticoagulation due to history of multiple falls and alcohol abuse. His heart rate continues to hit 160 easily  overnight he was taking off the diltiazem drip. He is currently on Tikosyn and metoprolol will restart the IV diltiazem drip.  Electrolyte imbalance: Try to keep magnesium greater than 2 potassium greater than 4 phosphorus greater than 3. All of them have been repleted IV. CK was 47.  CAD (coronary artery disease): Continue metoprolol and aspirin denies any chest pain.  COPD: Noted.   Small laceration on the right side of the forehead: No active bleeding CT of the head unremarkable.  Essential hypertension: Continue Cozaar and metoprolol.  Severe caloric malnutrition: Continue Ensure 3 times daily his percentage meal consumption is less than 10%.    DVT prophylaxis: lovenox Family Communication:none Status is: Inpatient  Remains inpatient appropriate because:Hemodynamically unstable   Dispo: The patient is from: Home              Anticipated d/c is to: SNF              Anticipated d/c date is: 3 day              Patient currently is not medically stable to d/c.   Code Status:     Code Status Orders  (From admission, onward)         Start     Ordered   02/08/20 2245  Full code  Continuous     02/08/20 2245        Code Status History    Date Active Date Inactive Code Status Order ID Comments User Context   10/25/2019 0015 10/27/2019 2015 Full Code [id number]  [name], MD ED   09/12/2018 [phone]  09/13/2018 1905 Full Code [id number]  [name] Inpatient   03/31/2018 1630 04/01/2018 0015 Full Code [id number]  [name], MD Inpatient   12/12/2017 1624 12/18/2017 1801 Full Code [id number]  [name], MD Inpatient   12/12/2017 1622 12/12/2017 1624 Full Code [id number]  [name], MD Inpatient   Advance Care Planning Activity        IV Access:    Peripheral IV   Procedures and diagnostic studies:   ECHOCARDIOGRAM COMPLETE  Result Date: 02/11/2020    ECHOCARDIOGRAM REPORT   Patient Name:   Adam [name] Date of Exam:  02/11/2020 Medical Rec #:  [id number]        Height:       70.0 in Accession #:    [id number]       Weight:       242.0 lb Date of Birth:  [birth year]        BSA:          2.263 m Patient Age:    27 years         BP:           121/96 mmHg Patient Gender: M                HR:           68 bpm. Exam Location:  Inpatient Procedure: 2D Echo Indications:    Abnormal ECG R94.31  History:        Patient has prior history of Echocardiogram examinations, most                 recent 03/30/2018. Prior CABG, COPD, Arrythmias:Atrial                 Fibrillation; Risk Factors:Hypertension, Dyslipidemia and                 Diabetes.  Sonographer:    Mikki Santee RDCS (AE) Referring Phys: Trail  Sonographer Comments: No subcostal window. Image acquisition challenging due to uncooperative patient. IMPRESSIONS  1. Technically difficult study with limited views and patient uncooperative. Grossly normal LV systolic function. Left ventricular ejection fraction, by estimation, is 60 to 65%. Image quality is not sufficient to assess for regional wall motiuon abnormalities. There is mild left ventricular hypertrophy. Left ventricular diastolic parameters are indeterminate.  2. Right ventricle is pooly visualized but grossly normal size with mildly reduced systolic function.  3. The mitral valve is normal in structure. Mild mitral valve regurgitation.  4. The aortic valve was not well visualized. Aortic valve regurgitation is not visualized. No aortic stenosis is present. FINDINGS  Left Ventricle: Left ventricular ejection fraction, by estimation, is 60 to 65%. The left ventricle has normal function. The left ventricle has no regional wall motion abnormalities. The left ventricular internal cavity size was normal in size. There is  mild left ventricular hypertrophy. Left ventricular diastolic parameters are indeterminate. Right Ventricle: The right ventricular size is normal. Right vetricular wall thickness was not  assessed. Right ventricular systolic function is mildly reduced. Left Atrium: Left atrial size was normal in size. Right Atrium: Right atrial size was normal in size. Pericardium: There is no evidence of pericardial effusion. Mitral Valve: The mitral valve is normal in structure. Mild mitral valve regurgitation. Tricuspid Valve: The tricuspid valve is grossly normal. Tricuspid valve regurgitation is not demonstrated. Aortic Valve: The aortic valve was not well visualized. Aortic valve regurgitation is not visualized. No aortic stenosis is present. Pulmonic  Valve: The pulmonic valve was not well visualized. Pulmonic valve regurgitation is not visualized. Aorta: The aortic root is normal in size and structure. IAS/Shunts: The interatrial septum was not well visualized.  LEFT VENTRICLE PLAX 2D LVIDd:         4.70 cm  Diastology LVIDs:         3.40 cm  LV e' lateral: 7.35 cm/s LV PW:         1.10 cm  LV e' medial:  5.10 cm/s LV IVS:        1.00 cm LVOT diam:     2.20 cm LV SV:         65 LV SV Index:   29 LVOT Area:     3.80 cm  RIGHT VENTRICLE RV S prime:     6.30 cm/s TAPSE (M-mode): 1.9 cm LEFT ATRIUM             Index       RIGHT ATRIUM           Index LA diam:        4.20 cm 1.86 cm/m  RA Area:     12.90 cm LA Vol (A2C):   47.5 ml 20.99 ml/m RA Volume:   24.40 ml  10.78 ml/m LA Vol (A4C):   33.5 ml 14.80 ml/m LA Biplane Vol: 43.1 ml 19.04 ml/m  AORTIC VALVE LVOT Vmax:   94.00 cm/s LVOT Vmean:  64.500 cm/s LVOT VTI:    0.172 m  AORTA Ao Root diam: 3.50 cm MR Peak grad: 59.3 mmHg MR Vmax:      385.00 cm/s SHUNTS                           Systemic VTI:  0.17 m                           Systemic Diam: 2.20 cm Oswaldo Milian MD Electronically signed by Oswaldo Milian MD Signature Date/Time: 02/11/2020/4:51:40 PM    Final      Medical Consultants:    None.  Anti-Infectives:   none  Subjective:    Ashby Dawes Crandell patient is nonverbal today moaning and groaning significantly  lethargic.  Objective:    Vitals:   02/11/20 2013 02/12/20 0100 02/12/20 0504 02/12/20 0741  BP: (!) 161/100 (!) 140/94 (!) 141/103 (!) 155/86  Pulse: 85 68 88 85  Resp: 18 19 18  (!) 21  Temp: 97.6 F (36.4 C) 97.8 F (36.6 C) 97.8 F (36.6 C)   TempSrc: Oral Oral Oral   SpO2: 95% 98% 97% 98%  Weight:  111.1 kg    Height:       SpO2: 98 % O2 Flow Rate (L/min): 2 L/min   Intake/Output Summary (Last 24 hours) at 02/12/2020 0855 Last data filed at 02/12/2020 0559 Gross per 24 hour  Intake 1771.24 ml  Output 650 ml  Net 1121.24 ml   Filed Weights   02/10/20 0430 02/11/20 0428 02/12/20 0100  Weight: 109.8 kg 109.8 kg 111.1 kg    Exam: General exam: In no acute distress. Respiratory system: Good air movement and clear to auscultation. Cardiovascular system: S1 & S2 heard, RRR. No JVD. Gastrointestinal system: Abdomen is nondistended, soft and nontender.  Central nervous system: He is lethargic with outbursts of agitation. Extremities: No pedal edema. Skin: No rashes, lesions or ulcers Psychiatry: No judgment or insight of medical condition. Data Reviewed:  Labs: Basic Metabolic Panel: Recent Labs  Lab 02/08/20 2244 02/09/20 0252 02/09/20 1117 02/09/20 1117 02/10/20 0436 02/10/20 0436 02/10/20 1634 02/10/20 1634 02/11/20 0603 02/12/20 0514  NA  --    < > 139  --  143  --  144  --  140 142  K  --    < > 3.9   < > 3.4*   < > 3.9   < > 3.6 4.0  CL  --    < > 100  --  108  --  108  --  104 106  CO2  --    < > 22  --  25  --  27  --  24 25  GLUCOSE  --    < > 174*  --  144*  --  195*  --  171* 229*  BUN  --    < > 11  --  9  --  11  --  9 10  CREATININE  --    < > 0.94  --  0.85  --  0.79  --  0.73 0.77  CALCIUM  --    < > 9.6  --  8.9  --  9.1  --  9.1 9.1  MG 2.0  --  1.8  --  2.2  --   --   --  1.9 1.9  PHOS  --   --  1.4*  --   --   --   --   --  2.3*  2.2*  --    < > = values in this interval not displayed.   GFR Estimated Creatinine Clearance:  108.7 mL/min (by C-G formula based on SCr of 0.77 mg/dL). Liver Function Tests: Recent Labs  Lab 02/08/20 2015 02/09/20 1117  AST 39 31  ALT 29 25  ALKPHOS 80 63  BILITOT 1.7* 1.6*  PROT 8.2* 6.0*  ALBUMIN 4.1 3.3*   Recent Labs  Lab 02/08/20 2015  LIPASE 61*   No results for input(s): AMMONIA in the last 168 hours. Coagulation profile Recent Labs  Lab 02/11/20 0603  INR 1.0   COVID-19 Labs  No results for input(s): DDIMER, FERRITIN, LDH, CRP in the last 72 hours.  Lab Results  Component Value Date   SARSCOV2NAA NEGATIVE 02/08/2020   [place] NEGATIVE 10/24/2019    CBC: Recent Labs  Lab 02/08/20 2015 02/08/20 2043 02/08/20 2244 02/09/20 1117  WBC 11.7*  --  13.4* 7.8  NEUTROABS 9.2*  --   --   --   HGB 15.1 15.6 12.6* 12.9*  HCT 46.5 46.0 38.8* 40.0  38.5  MCV 93.0  --  93.7 92.8  PLT 296  --  246 232   Cardiac Enzymes: Recent Labs  Lab 02/11/20 0603  CKTOTAL 47*   BNP (last 3 results) Recent Labs    12/03/19 1502  PROBNP 266.0*   CBG: Recent Labs  Lab 02/08/20 2001 02/11/20 0809 02/11/20 1234 02/11/20 1629 02/12/20 0609  GLUCAP 145* 193* 230* 258* 230*   D-Dimer: No results for input(s): DDIMER in the last 72 hours. Hgb A1c: Recent Labs    02/10/20 0844  HGBA1C 6.6*   Lipid Profile: No results for input(s): CHOL, HDL, LDLCALC, TRIG, CHOLHDL, LDLDIRECT in the last 72 hours. Thyroid function studies: No results for input(s): TSH, T4TOTAL, T3FREE, THYROIDAB in the last 72 hours.  Invalid input(s): FREET3 Anemia work up: Recent Labs    02/09/20 1117  VITAMINB12 353   Sepsis Labs:  Recent Labs  Lab 02/08/20 2015 02/08/20 2222 02/08/20 2244 02/09/20 0802 02/09/20 1117  WBC 11.7*  --  13.4*  --  7.8  LATICACIDVEN  --  2.3*  --  1.3 1.9   Microbiology Recent Results (from the past 240 hour(s))  Respiratory Panel by RT PCR (Flu A&B, Covid) - Nasopharyngeal Swab     Status: None   Collection Time: 02/08/20 10:26 PM    Specimen: Nasopharyngeal Swab  Result Value Ref Range Status   SARS Coronavirus 2 by RT PCR NEGATIVE NEGATIVE Final    Comment: (NOTE) SARS-CoV-2 target nucleic acids are NOT DETECTED. The SARS-CoV-2 RNA is generally detectable in upper respiratoy specimens during the acute phase of infection. The lowest concentration of SARS-CoV-2 viral copies this assay can detect is 131 copies/mL. A negative result does not preclude SARS-Cov-2 infection and should not be used as the sole basis for treatment or other patient management decisions. A negative result may occur with  improper specimen collection/handling, submission of specimen other than nasopharyngeal swab, presence of viral mutation(s) within the areas targeted by this assay, and inadequate number of viral copies (<131 copies/mL). A negative result must be combined with clinical observations, patient history, and epidemiological information. The expected result is Negative. Fact Sheet for Patients:  PinkCheek.be Fact Sheet for Healthcare Providers:  GravelBags.it This test is not yet ap proved or cleared by the [place] FDA and  has been authorized for detection and/or diagnosis of SARS-CoV-2 by FDA under an Emergency Use Authorization (EUA). This EUA will remain  in effect (meaning this test can be used) for the duration of the COVID-19 declaration under Section 564(b)(1) of the Act, 21 U.S.C. section 360bbb-3(b)(1), unless the authorization is terminated or revoked sooner.    Influenza A by PCR NEGATIVE NEGATIVE Final   Influenza B by PCR NEGATIVE NEGATIVE Final    Comment: (NOTE) The Xpert Xpress SARS-CoV-2/FLU/RSV assay is intended as an aid in  the diagnosis of influenza from Nasopharyngeal swab specimens and  should not be used as a sole basis for treatment. Nasal washings and  aspirates are unacceptable for Xpert Xpress SARS-CoV-2/FLU/RSV  testing. Fact Sheet  for Patients: PinkCheek.be Fact Sheet for Healthcare Providers: GravelBags.it This test is not yet approved or cleared by the [place] FDA and  has been authorized for detection and/or diagnosis of SARS-CoV-2 by  FDA under an Emergency Use Authorization (EUA). This EUA will remain  in effect (meaning this test can be used) for the duration of the  Covid-19 declaration under Section 564(b)(1) of the Act, 21  U.S.C. section 360bbb-3(b)(1), unless the authorization is  terminated or revoked. Performed at [facility], [place] [address] [place], [place] [address]      Medications:   . allopurinol  300 mg Oral Daily  . aspirin  81 mg Oral Daily  . dofetilide  125 mcg Oral BID  . DULoxetine  60 mg Oral Daily  . enoxaparin (LOVENOX) injection  40 mg Subcutaneous Q24H  . feeding supplement (ENSURE ENLIVE)  237 mL Oral BID BM  . fenofibrate  160 mg Oral Daily  . fluticasone furoate-vilanterol  1 puff Inhalation Daily  . folic acid  1 mg Oral Daily  . icosapent Ethyl  2 g Oral BID  . insulin aspart  0-9 Units Subcutaneous TID WC  . insulin detemir  5 Units Subcutaneous Daily  . LORazepam  0-4 mg Intravenous Q12H  . LORazepam  1 mg Oral Q8H  . losartan  100 mg Oral Daily  . metoprolol tartrate  100 mg Oral BID  . multivitamin with minerals  1 tablet Oral Daily  . pantoprazole  40 mg Oral BID  . potassium chloride  20 mEq Oral BID  . rosuvastatin  40 mg Oral Daily  . thiamine  100 mg Oral Daily   Or  . thiamine  100 mg Intravenous Daily  . umeclidinium bromide  1 puff Inhalation Daily   Continuous Infusions: . dextrose 5 % and 0.45% NaCl 75 mL/hr at 02/12/20 0544  . diltiazem (CARDIZEM) infusion 75 mg/hr (02/11/20 2130)      LOS: 4 days   Charlynne Cousins  [facility]  02/12/2020, 8:55 AM

## 2020-02-12 NOTE — Progress Notes (Signed)
Subjective: Continues to be confused. Received 4mg  ativan since yesterday, 2 mg prn and 2mg  po scheduled.    Exam: Vitals:   02/12/20 0504 02/12/20 0741  BP: (!) 141/103 (!) 155/86  Pulse: 88 85  Resp: 18 (!) 21  Temp: 97.8 F (36.6 C)   SpO2: 97% 98%   Gen: In bed, NAD Resp: non-labored breathing, no acute distress Abd: soft, nt  Neuro: MS: awakens to mild simtuli, gives correct year but moth in July/  [id number], EOMI, face symmetric Motor: follows commands x 4  Sensory:responds to stim x 4.   Pertinent Labs: b12 353 tsh 3.1 Thiamine - not checked prior to supplementation.   Impression: 69 year old male with fairly heavy alcohol abuse who presents with worsening gait dysfunction, encephalopathy, and the patient reported to me yesterday that he had had some double vision that this is resolved.  This constellation of symptoms is more consistent with Wernicke's encephalopathy and he has had high-dose thiamine replacement.  I suspect his current status is a combination of Wernicke's/delirium tremens.  Though there is a question of NPH, given his current delirium, this is not able to be assessed in the current situation.  If he recovers, a larger on LP could be considered, though I think that this is less likely than his other chronic issues to be playing a role in his subacute decline.  Recommendations: 1) continue CIWA protocol 2) continue thiamine supplementation. 3) check ammonia  Roland Rack, MD [facility] [phone]  If 7pm- 7am, please page neurology on call as listed in [place].

## 2020-02-12 NOTE — Care Management Important Message (Signed)
Important Message  Patient Details  Name: Adam [name] MRN: [id number] Date of Birth: Feb 08, 1951   Medicare Important Message Given:  Yes     [name] 02/12/2020, 9:07 AM

## 2020-02-13 ENCOUNTER — Ambulatory Visit: Payer: Medicare Other | Admitting: Family Medicine

## 2020-02-13 LAB — BASIC METABOLIC PANEL
Anion gap: 13 (ref 5–15)
BUN: 12 mg/dL (ref 8–23)
CO2: 25 mmol/L (ref 22–32)
Calcium: 8.9 mg/dL (ref 8.9–10.3)
Chloride: 101 mmol/L (ref 98–111)
Creatinine, Ser: 0.85 mg/dL (ref 0.61–1.24)
GFR calc Af Amer: >60 mL/min (ref >60)
GFR calc non Af Amer: >60 mL/min (ref >60)
Glucose, Bld: 246 mg/dL — ABNORMAL HIGH (ref 70–99)
Potassium: 4 mmol/L (ref 3.5–5.1)
Sodium: 139 mmol/L (ref 135–145)

## 2020-02-13 LAB — GLUCOSE, CAPILLARY
Glucose-Capillary: 196 mg/dL — ABNORMAL HIGH (ref 70–99)
Glucose-Capillary: 277 mg/dL — ABNORMAL HIGH (ref 70–99)
Glucose-Capillary: 156 mg/dL — ABNORMAL HIGH (ref 70–99)
Glucose-Capillary: 147 mg/dL — ABNORMAL HIGH (ref 70–99)

## 2020-02-13 LAB — MAGNESIUM: Magnesium: 2.2 mg/dL (ref 1.7–2.4)

## 2020-02-13 MED ORDER — METOPROLOL TARTRATE 5 MG/5ML IV SOLN
5.0000 mg | INTRAVENOUS | Status: DC | PRN
  Administered 2020-02-13: 5 mg via INTRAVENOUS
  Filled 2020-02-13: qty 5

## 2020-02-13 MED ORDER — K PHOS MONO-SOD PHOS DI & MONO 155-852-130 MG PO TABS
500.0000 mg | ORAL_TABLET | Freq: Three times a day (TID) | ORAL | Status: AC
  Administered 2020-02-13 (×2): 500 mg via ORAL
  Filled 2020-02-13 (×2): qty 2

## 2020-02-13 MED ORDER — INSULIN DETEMIR 100 UNIT/ML ~~LOC~~ SOLN
8.0000 [IU] | Freq: Two times a day (BID) | SUBCUTANEOUS | Status: DC
  Administered 2020-02-13 – 2020-02-18 (×10): 8 [IU] via SUBCUTANEOUS
  Filled 2020-02-13 (×11): qty 0.08

## 2020-02-13 MED ORDER — GUAIFENESIN-DM 100-10 MG/5ML PO SYRP
5.0000 mL | ORAL_SOLUTION | ORAL | Status: DC | PRN
  Administered 2020-02-13 – 2020-02-15 (×2): 5 mL via ORAL
  Filled 2020-02-13 (×2): qty 5

## 2020-02-13 NOTE — Progress Notes (Signed)
[facility] Summary Report / Loop Recorder 

## 2020-02-13 NOTE — Progress Notes (Addendum)
NEUROLOGY PROGRESS NOTE  Subjective: Patient is extremely lethargic at this point.  He did receive Ativan at 552 this morning.  Has no complaints.  Exam: Vitals:   02/13/20 0750 02/13/20 0755  BP: 137/86   Pulse: 83   Resp: 20   Temp:    SpO2: 98% 97%     Neuro:  Mental Status: Very lethargic.  He is aware that he is at [facility] and the month is May.  Able to follow simple commands.  Continues to fall asleep if not stimulated. Cranial Nerves: II: Able to count fingers III,IV, VI: , extra-ocular motions intact bilaterally pupils equal, round, reactive to light and accommodation V,VII: Face symmetric VIII: hearing normal bilaterally Motor: Moving all extremities antigravity Sensory: Responds to stimuli throughout all 4 extremities    Medications:  Scheduled: . allopurinol  300 mg Oral Daily  . aspirin  81 mg Oral Daily  . dofetilide  125 mcg Oral BID  . DULoxetine  60 mg Oral Daily  . enoxaparin (LOVENOX) injection  40 mg Subcutaneous Q24H  . feeding supplement (ENSURE ENLIVE)  237 mL Oral BID BM  . fenofibrate  160 mg Oral Daily  . fluticasone furoate-vilanterol  1 puff Inhalation Daily  . folic acid  1 mg Oral Daily  . icosapent Ethyl  2 g Oral BID  . insulin aspart  0-9 Units Subcutaneous TID WC  . insulin detemir  5 Units Subcutaneous BID  . LORazepam  0-4 mg Intravenous Q12H  . LORazepam  1 mg Oral Q6H  . losartan  100 mg Oral Daily  . metoprolol tartrate  100 mg Oral BID  . multivitamin with minerals  1 tablet Oral Daily  . pantoprazole  40 mg Oral BID  . phosphorus  500 mg Oral TID  . potassium chloride  20 mEq Oral BID  . rosuvastatin  40 mg Oral Daily  . sodium chloride flush  10-40 mL Intracatheter Q12H  . thiamine  100 mg Oral Daily   Or  . thiamine  100 mg Intravenous Daily  . umeclidinium bromide  1 puff Inhalation Daily   Continuous: . diltiazem (CARDIZEM) infusion Stopped (02/12/20 1500)   [id number] & mag hydroxide-simeth, metoprolol  tartrate, sodium chloride flush  Pertinent Labs/Diagnostics: Ammonia [place] PA-C [facility] [phone]  I have seen the patient and reviewed the above note.  Assessment:  This is a 69 year old male with fairly heavy alcohol abuse presenting with worsening of gait dysfunction, encephalopathy.   I suspect his current status is a combination of Wernicke's/delirium tremens.  Though there is a question of NPH, given his current delirium, this is not able to be assessed in the current situation.  If he recovers, a larger on LP could be considered, though I think that this is less likely than his other chronic issues to be playing a role in his subacute decline.  I do not think that large volume LP is at all likely to be helpful with this hospitalization.  Recommendations: -Continue CIWA protocol -Continue thiamine supplements   Roland Rack, MD [facility] [phone]  If 7pm- 7am, please page neurology on call as listed in [place].

## 2020-02-13 NOTE — Progress Notes (Signed)
Patient's pulse rate is 155/min BP 130/99.  IV metoprolol given.  Will continue to monitor closely.

## 2020-02-13 NOTE — Progress Notes (Signed)
[facility] PROGRESS NOTE    Progress Note  [name]  [id number] DOB: [birth year] DOA: 02/08/2020 PCP: [name], MD     Brief Narrative:   Adam [name] is an 69 y.o. male past medical history significant for diabetes mellitus type 2 CAD status post CABG, atrial fibrillation not on anticoagulation due to multiple falls, essential hypertension alcohol abuse comes into the ED for frequent falls, he denies any chest pain shortness of breath nausea vomiting or diarrhea he was found in the ED in A. 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 fib, followed by EP Dr. Rayann Heman -EP note from 11/30/2019 reviewed, patient declined anticoagulation and said he would never go back on the blood thinner and would take his chances despite stroke risk -Restart Cardizem drip, continue p.o. metoprolol if he can tolerate this otherwise will change this to IV -Poor long-term anticoagulation candidate given chronic alcoholism as well  DM 2 -CBGs fluctuating, continue Lantus and sliding scale  Hypokalemia Hypomagnesemia -Replaced  CAD (coronary artery disease): Continue metoprolol and aspirin  COPD: Noted.   Small laceration on the right side of  the forehead: No active bleeding CT of the head unremarkable.  Essential hypertension: Continue Cozaar and metoprolol.  Protein calorie malnutrition: Continue Ensure 3 times daily  Code status: DNR, discussed with wife 5/12 DVT prophylaxis: lovenox Family Communication:none, updated spouse Status is: Inpatient  Remains inpatient appropriate because: Encephalopathy, A. fib RVR   Dispo: The patient is from: Home              Anticipated d/c is to: SNF              Anticipated d/c date is: 3-4 days              Patient currently is not medically stable to d/c.   Code Status:     Code Status Orders  (From admission, onward)         Start     Ordered   02/08/20 2245  Full code  Continuous     02/08/20 2245        Code Status History    Date Active Date Inactive Code Status Order ID Comments User Context   10/25/2019 0015 10/27/2019 2015 Full Code [id number]  [name], MD ED   09/12/2018 1829 09/13/2018 1905 Full Code [id number]  [name] Inpatient   03/31/2018 1630 04/01/2018 0015 Full Code [id number]  [name], MD Inpatient   12/12/2017 1624 12/18/2017 1801 Full Code [id number]  [name], MD Inpatient   12/12/2017 1622 12/12/2017 1624 Full Code [id number]  [name], MD Inpatient   Advance Care Planning Activity        IV Access:    Peripheral IV   Procedures and diagnostic studies:   ECHOCARDIOGRAM COMPLETE  Result Date: 02/11/2020    ECHOCARDIOGRAM REPORT   Patient Name:   Adam [name] Date of Exam: 02/11/2020 Medical Rec #:  [id number]        Height:       70.0 in Accession #:    [id number]       Weight:       242.0 lb Date of Birth:  22-Feb-1951        BSA:          2.263 m Patient Age:    20 years         BP:           121/96 mmHg Patient Gender: M                HR:           68 bpm. Exam Location:  Inpatient Procedure: 2D Echo Indications:    Abnormal ECG R94.31  History:        Patient has prior history of Echocardiogram  examinations, most                 recent 03/30/2018. Prior CABG, COPD, Arrythmias:Atrial                 Fibrillation; Risk Factors:Hypertension, Dyslipidemia and                 Diabetes.  Sonographer:    Mikki Santee RDCS (AE) Referring Phys: Halma  Sonographer Comments: No subcostal window. Image acquisition challenging due to uncooperative patient. IMPRESSIONS  1. Technically difficult study with limited views and patient uncooperative. Grossly normal LV systolic function. Left ventricular ejection fraction, by estimation, is 60 to 65%. Image quality is not sufficient to assess for regional wall motiuon abnormalities. There is mild left ventricular hypertrophy. Left ventricular diastolic parameters are indeterminate.  2. Right ventricle is pooly visualized but grossly normal size with mildly reduced systolic function.  3. The mitral valve is normal in structure. Mild mitral valve regurgitation.  4. The aortic valve was not well visualized. Aortic valve regurgitation is not visualized. No aortic stenosis is present. FINDINGS  Left Ventricle: Left ventricular ejection fraction, by estimation, is 60 to 65%. The left ventricle has normal function. The left ventricle has no regional wall motion abnormalities. The left ventricular internal cavity size was normal in size. There is  mild left ventricular hypertrophy. Left ventricular diastolic parameters are indeterminate. Right Ventricle: The right ventricular size is normal. Right vetricular wall thickness was not assessed. Right ventricular systolic function is mildly reduced. Left Atrium: Left atrial size was normal in size. Right Atrium: Right atrial size was normal in size. Pericardium: There is no evidence of pericardial effusion. Mitral Valve: The mitral valve is normal in structure. Mild mitral valve regurgitation. Tricuspid Valve: The tricuspid valve is grossly normal. Tricuspid valve regurgitation is not demonstrated. Aortic Valve: The  aortic valve was not well visualized. Aortic valve regurgitation is not visualized. No aortic stenosis is present. Pulmonic Valve: The pulmonic valve was not well visualized. Pulmonic valve regurgitation is not visualized. Aorta: The aortic root is normal in size and structure. IAS/Shunts: The interatrial septum was not well visualized.  LEFT VENTRICLE PLAX 2D LVIDd:         4.70 cm  Diastology LVIDs:         3.40 cm  LV e' lateral: 7.35 cm/s LV PW:         1.10 cm  LV e' medial:  5.10  cm/s LV IVS:        1.00 cm LVOT diam:     2.20 cm LV SV:         65 LV SV Index:   29 LVOT Area:     3.80 cm  RIGHT VENTRICLE RV S prime:     6.30 cm/s TAPSE (M-mode): 1.9 cm LEFT ATRIUM             Index       RIGHT ATRIUM           Index LA diam:        4.20 cm 1.86 cm/m  RA Area:     12.90 cm LA Vol (A2C):   47.5 ml 20.99 ml/m RA Volume:   24.40 ml  10.78 ml/m LA Vol (A4C):   33.5 ml 14.80 ml/m LA Biplane Vol: 43.1 ml 19.04 ml/m  AORTIC VALVE LVOT Vmax:   94.00 cm/s LVOT Vmean:  64.500 cm/s LVOT VTI:    0.172 m  AORTA Ao Root diam: 3.50 cm MR Peak grad: 59.3 mmHg MR Vmax:      385.00 cm/s SHUNTS                           Systemic VTI:  0.17 m                           Systemic Diam: 2.20 cm Oswaldo Milian MD Electronically signed by Oswaldo Milian MD Signature Date/Time: 02/11/2020/4:51:40 PM    Final      Medical Consultants:    None.  Anti-Infectives:   none  Subjective:   -Overnight back in rapid A. fib, remains lethargic this morning, did get scheduled Ativan at 6 AM  Objective:    Vitals:   02/13/20 0449 02/13/20 0750 02/13/20 0750 02/13/20 0755  BP: (!) 125/91  137/86   Pulse: 80  83   Resp: 16  20   Temp: 98.6 F (37 C) (!) 97.5 F (36.4 C)    TempSrc: Oral Oral    SpO2: 98%  98% 97%  Weight: 112 kg     Height:       SpO2: 97 % O2 Flow Rate (L/min): 2 L/min   Intake/Output Summary (Last 24 hours) at 02/13/2020 1152 Last data filed at 02/13/2020 0700 Gross per 24 hour    Intake 120 ml  Output 550 ml  Net -430 ml   Filed Weights   02/11/20 0428 02/12/20 0100 02/13/20 0449  Weight: 109.8 kg 111.1 kg 112 kg    Exam Average built male laying in bed, somnolent easily arousable, mumbles a few words and goes back to sleep HEENT pupils are reactive CVS S1-S2 regular rate rhythm Lungs diminished breath sounds at both bases otherwise clear Abdomen is soft nontender with normal bowel sounds Extremities with no edema Neuro, lethargic, but follows simple commands and answers a few questions Skin: No rashes on exposed skin Psychiatry: Unable to assess Data Reviewed:    Labs: Basic Metabolic Panel: Recent Labs  Lab 02/09/20 1117 02/09/20 1117 02/10/20 0436 02/10/20 0436 02/10/20 1634 02/10/20 1634 02/11/20 0603 02/11/20 0603 02/12/20 0514 02/13/20 0355  NA 139   < > 143  --  144  --  140  --  142 139  K 3.9   < > 3.4*   < > 3.9   < > 3.6   < > 4.0 4.0  CL  100   < > 108  --  108  --  104  --  106 101  CO2 22   < > 25  --  27  --  24  --  25 25  GLUCOSE 174*   < > 144*  --  195*  --  171*  --  229* 246*  BUN 11   < > 9  --  11  --  9  --  10 12  CREATININE 0.94   < > 0.85  --  0.79  --  0.73  --  0.77 0.85  CALCIUM 9.6   < > 8.9  --  9.1  --  9.1  --  9.1 8.9  MG 1.8  --  2.2  --   --   --  1.9  --  1.9 2.2  PHOS 1.4*  --   --   --   --   --  2.3*  2.2*  --   --   --    < > = values in this interval not displayed.   GFR Estimated Creatinine Clearance: 102.8 mL/min (by C-G formula based on SCr of 0.85 mg/dL). Liver Function Tests: Recent Labs  Lab 02/08/20 2015 02/09/20 1117  AST 39 31  ALT 29 25  ALKPHOS 80 63  BILITOT 1.7* 1.6*  PROT 8.2* 6.0*  ALBUMIN 4.1 3.3*   Recent Labs  Lab 02/08/20 2015  LIPASE 61*   Recent Labs  Lab 02/12/20 1137  AMMONIA 46*   Coagulation profile Recent Labs  Lab 02/11/20 0603  INR 1.0   COVID-19 Labs  No results for input(s): DDIMER, FERRITIN, LDH, CRP in the last 72 hours.  Lab Results   Component Value Date   SARSCOV2NAA NEGATIVE 02/08/2020   [place] NEGATIVE 10/24/2019    CBC: Recent Labs  Lab 02/08/20 2015 02/08/20 2043 02/08/20 2244 02/09/20 1117  WBC 11.7*  --  13.4* 7.8  NEUTROABS 9.2*  --   --   --   HGB 15.1 15.6 12.6* 12.9*  HCT 46.5 46.0 38.8* 40.0  38.5  MCV 93.0  --  93.7 92.8  PLT 296  --  246 232   Cardiac Enzymes: Recent Labs  Lab 02/11/20 0603  CKTOTAL 47*   BNP (last 3 results) Recent Labs    12/03/19 1502  PROBNP 266.0*   CBG: Recent Labs  Lab 02/12/20 0609 02/12/20 1148 02/12/20 1604 02/12/20 2134 02/13/20 0632  GLUCAP 230* 232* 212* 212* 196*   D-Dimer: No results for input(s): DDIMER in the last 72 hours. Hgb A1c: No results for input(s): HGBA1C in the last 72 hours. Lipid Profile: No results for input(s): CHOL, HDL, LDLCALC, TRIG, CHOLHDL, LDLDIRECT in the last 72 hours. Thyroid function studies: No results for input(s): TSH, T4TOTAL, T3FREE, THYROIDAB in the last 72 hours.  Invalid input(s): FREET3 Anemia work up: No results for input(s): VITAMINB12, FOLATE, FERRITIN, TIBC, IRON, RETICCTPCT in the last 72 hours. Sepsis Labs: Recent Labs  Lab 02/08/20 2015 02/08/20 2222 02/08/20 2244 02/09/20 0802 02/09/20 1117  WBC 11.7*  --  13.4*  --  7.8  LATICACIDVEN  --  2.3*  --  1.3 1.9   Microbiology Recent Results (from the past 240 hour(s))  Respiratory Panel by RT PCR (Flu A&B, Covid) - Nasopharyngeal Swab     Status: None   Collection Time: 02/08/20 10:26 PM   Specimen: Nasopharyngeal Swab  Result Value Ref Range Status   SARS Coronavirus 2 by RT  PCR NEGATIVE NEGATIVE Final    Comment: (NOTE) SARS-CoV-2 target nucleic acids are NOT DETECTED. The SARS-CoV-2 RNA is generally detectable in upper respiratoy specimens during the acute phase of infection. The lowest concentration of SARS-CoV-2 viral copies this assay can detect is 131 copies/mL. A negative result does not preclude SARS-Cov-2 infection and  should not be used as the sole basis for treatment or other patient management decisions. A negative result may occur with  improper specimen collection/handling, submission of specimen other than nasopharyngeal swab, presence of viral mutation(s) within the areas targeted by this assay, and inadequate number of viral copies (<131 copies/mL). A negative result must be combined with clinical observations, patient history, and epidemiological information. The expected result is Negative. Fact Sheet for Patients:  PinkCheek.be Fact Sheet for Healthcare Providers:  GravelBags.it This test is not yet ap proved or cleared by the [place] FDA and  has been authorized for detection and/or diagnosis of SARS-CoV-2 by FDA under an Emergency Use Authorization (EUA). This EUA will remain  in effect (meaning this test can be used) for the duration of the COVID-19 declaration under Section 564(b)(1) of the Act, 21 U.S.C. section 360bbb-3(b)(1), unless the authorization is terminated or revoked sooner.    Influenza A by PCR NEGATIVE NEGATIVE Final   Influenza B by PCR NEGATIVE NEGATIVE Final    Comment: (NOTE) The Xpert Xpress SARS-CoV-2/FLU/RSV assay is intended as an aid in  the diagnosis of influenza from Nasopharyngeal swab specimens and  should not be used as a sole basis for treatment. Nasal washings and  aspirates are unacceptable for Xpert Xpress SARS-CoV-2/FLU/RSV  testing. Fact Sheet for Patients: PinkCheek.be Fact Sheet for Healthcare Providers: GravelBags.it This test is not yet approved or cleared by the [place] FDA and  has been authorized for detection and/or diagnosis of SARS-CoV-2 by  FDA under an Emergency Use Authorization (EUA). This EUA will remain  in effect (meaning this test can be used) for the duration of the  Covid-19 declaration under Section  564(b)(1) of the Act, 21  U.S.C. section 360bbb-3(b)(1), unless the authorization is  terminated or revoked. Performed at [facility], [place] [address], [place], [place] [address]      Medications:   . allopurinol  300 mg Oral Daily  . aspirin  81 mg Oral Daily  . dofetilide  125 mcg Oral BID  . DULoxetine  60 mg Oral Daily  . enoxaparin (LOVENOX) injection  40 mg Subcutaneous Q24H  . feeding supplement (ENSURE ENLIVE)  237 mL Oral BID BM  . fenofibrate  160 mg Oral Daily  . fluticasone furoate-vilanterol  1 puff Inhalation Daily  . folic acid  1 mg Oral Daily  . icosapent Ethyl  2 g Oral BID  . insulin aspart  0-9 Units Subcutaneous TID WC  . insulin detemir  5 Units Subcutaneous BID  . metoprolol tartrate  100 mg Oral BID  . multivitamin with minerals  1 tablet Oral Daily  . pantoprazole  40 mg Oral BID  . phosphorus  500 mg Oral TID  . potassium chloride  20 mEq Oral BID  . rosuvastatin  40 mg Oral Daily  . sodium chloride flush  10-40 mL Intracatheter Q12H  . thiamine  100 mg Oral Daily   Or  . thiamine  100 mg Intravenous Daily  . umeclidinium bromide  1 puff Inhalation Daily   Continuous Infusions: . diltiazem (CARDIZEM) infusion 5 mg/hr (02/13/20 0859)   Time spent: 35 minutes  LOS: 5 days   [facility]  02/13/2020, 11:52 AM

## 2020-02-13 NOTE — Progress Notes (Addendum)
Inpatient Diabetes Program Recommendations  AACE/ADA: New Consensus Statement on Inpatient Glycemic Control   Target Ranges:  Prepandial:   less than 140 mg/dL      Peak postprandial:   less than 180 mg/dL (1-2 hours)      Critically ill patients:  140 - 180 mg/dL   Results for Adam [name], Adam "[name]" (MRN [id number]) as of 02/13/2020 12:05  Ref. Range 02/12/2020 06:09 02/12/2020 11:48 02/12/2020 16:04 02/12/2020 21:34 02/13/2020 06:32 02/13/2020 11:53  Glucose-Capillary Latest Ref Range: 70 - 99 mg/dL 230 (H) 232 (H) 212 (H) 212 (H) 196 (H) 277 (H)   Review of Glycemic Control  Diabetes history: DM2 Outpatient Diabetes medications: Toujeo 80 units QAM, Januvia 100 mg daily, Jardiance 25 mg daily Current orders for Inpatient glycemic control: Levemir 5 units BID, Novolog 0-9 units TID with meals  Inpatient Diabetes Program Recommendations:    Insulin-Please consider increasing Levemir to 8 units BID and adding Novolog 0-5 units QHS.  Thanks, Barnie Alderman, RN, MSN, CDE Diabetes Coordinator Inpatient Diabetes Program [phone] (Team Pager from 8am to 5pm)

## 2020-02-13 NOTE — Progress Notes (Signed)
Physical Therapy Treatment Patient Details Name: Adam [name] MRN: [id number] DOB: 1951-04-19 Today's Date: 02/13/2020    History of Present Illness Pt is a 69 y.o. male with history of diabetes mellitus type 2, CAD status post CABG, A. fib not on anticoagulation probably from risk of falls with history of hypertension alcohol abuse presents to the ER the patient had a fall at home. Pt with frequent falls recently and drinking every day. Pt in afib with RVR and DKA in ED. CIWA protocol initiated.    PT Comments    Pt requiring more physical assistance today for bed mobility and much more lethargic compared to previous sessions. He required max A x2 for bed mobility and min A x2 to stand from EOB with RW. However, pt only able to tolerate standing very briefly (~30 seconds) and was unable to take pivotal steps to chair. Based on pt's decline in functional mobility, updated recommendations to SNF for further therapy services prior to returning home with family support. Pt would continue to benefit from skilled physical therapy services at this time while admitted and after d/c to address the below listed limitations in order to improve overall safety and independence with functional mobility.   Follow Up Recommendations  SNF;Supervision/Assistance - 24 hour     Equipment Recommendations  Other (comment)(defer to next venue of care)    Recommendations for Other Services       Precautions / Restrictions Precautions Precautions: Fall Restrictions Weight Bearing Restrictions: No    Mobility  Bed Mobility Overal bed mobility: Needs Assistance Bed Mobility: Supine to Sit;Sit to Supine     Supine to sit: Max assist;+2 for physical assistance;HOB elevated Sit to supine: Max assist;+2 for physical assistance   General bed mobility comments: increased time and effort, multimodal cueing for sequencing, pt able to initiate movement of bilateral LEs towards EOB; however, unable to really  assist further  Transfers Overall transfer level: Needs assistance Equipment used: Rolling walker (2 wheeled) Transfers: Sit to/from Stand Sit to Stand: Min assist;+2 safety/equipment;+2 physical assistance         General transfer comment: assistance needed to power into standing from EOB and for stability with transition; pt unable to take pivotal steps to chair and unable to tolerate standing for more than ~30 seconds  Ambulation/Gait                 Stairs             Wheelchair Mobility    Modified Rankin (Stroke Patients Only)       Balance Overall balance assessment: Needs assistance Sitting-balance support: Feet supported;Single extremity supported Sitting balance-Leahy Scale: Poor Sitting balance - Comments: pt fluctuating between requiring total A to min A to maintain upright sitting EOB Postural control: Left lateral lean Standing balance support: Bilateral upper extremity supported;During functional activity Standing balance-Leahy Scale: Poor Standing balance comment: reliant on external support                            Cognition Arousal/Alertness: Awake/alert Behavior During Therapy: Flat affect Overall Cognitive Status: Impaired/Different from baseline Area of Impairment: Orientation;Attention;Memory;Following commands;Safety/judgement;Awareness;Problem solving                 Orientation Level: Disoriented to;Place;Time;Situation Current Attention Level: Sustained Memory: Decreased short-term memory Following Commands: Follows one step commands inconsistently Safety/Judgement: Decreased awareness of deficits;Decreased awareness of safety Awareness: Intellectual Problem Solving: Slow processing;Decreased initiation;Difficulty sequencing;Requires verbal cues  Exercises      General Comments        Pertinent Vitals/Pain Pain Assessment: Faces Faces Pain Scale: Hurts little more Pain Location: bilateral  LEs Pain Descriptors / Indicators: Discomfort;Guarding;Grimacing Pain Intervention(s): Monitored during session;Repositioned    Home Living                      Prior Function            PT Goals (current goals can now be found in the care plan section) Acute Rehab PT Goals PT Goal Formulation: With patient Time For Goal Achievement: 02/23/20 Potential to Achieve Goals: Fair Progress towards PT goals: Progressing toward goals    Frequency    Min 3X/week      PT Plan Discharge plan needs to be updated    Co-evaluation              AM-PAC PT "6 Clicks" Mobility   Outcome Measure  Help needed turning from your back to your side while in a flat bed without using bedrails?: A Lot Help needed moving from lying on your back to sitting on the side of a flat bed without using bedrails?: Total Help needed moving to and from a bed to a chair (including a wheelchair)?: Total Help needed standing up from a chair using your arms (e.g., wheelchair or bedside chair)?: Total Help needed to walk in hospital room?: Total Help needed climbing 3-5 steps with a railing? : Total 6 Click Score: 7    End of Session Equipment Utilized During Treatment: Gait belt Activity Tolerance: Patient limited by fatigue Patient left: in bed;with call bell/phone within reach Nurse Communication: Mobility status PT Visit Diagnosis: Unsteadiness on feet (R26.81);Other symptoms and signs involving the nervous system (R29.898)     Time: 1130-1150 PT Time Calculation (min) (ACUTE ONLY): 20 min  Charges:  $Therapeutic Activity: 8-22 mins                     Anastasio Champion, DPT  Acute Rehabilitation Services Pager [phone] Office [place] 02/13/2020, 2:16 PM

## 2020-02-13 NOTE — Progress Notes (Signed)
  Speech Language Pathology Treatment: Dysphagia  Patient Details Name: Adam [name] MRN: [id number] DOB: [birth year] Today's Date: 02/13/2020 Time: [id number] SLP Time Calculation (min) (ACUTE ONLY): 10 min  Assessment / Plan / Recommendation Clinical Impression  Pt was seen for dysphagia treatment. He was alert and cooperative throughout the session. Coughing was intermittently noted at baseline. Nursing reported that the pt's p.o. intake has been poor but he tolerated small quantities of yogurt this morning without difficulty. He exhibited immediate coughing with thin liquids via straw and throat clearing was intermittently noted with regular texture solids. Mastication time was functionally prolonged. Pt did receive Ativan early in the morning so the impact of this on his performance is considered, but he also had an instance of coughing with liquids during the initial evaluation. A modified barium swallow study is recommended to further assess the functional integrity of the swallow mechanism.    HPI HPI: CADELL HEYSER is a 69 y.o. male with history of diabetes mellitus type 2, CAD status post CABG, A. fib not on anticoagulation probably from risk of falls with history of hypertension alcohol abuse presents to the ER the patient had a fall at home.  MRI was negative for acute intracranial abnormality.       SLP Plan  New goals to be determined pending instrumental study       Recommendations  Diet recommendations: Regular;Thin liquid Liquids provided via: Cup;Straw Medication Administration: Whole meds with puree Supervision: Staff to assist with self feeding Compensations: Slow rate;Small sips/bites Postural Changes and/or Swallow Maneuvers: Seated upright 90 degrees                Oral Care Recommendations: Oral care BID Follow up Recommendations: None SLP Visit Diagnosis: Dysphagia, unspecified (R13.10) Plan: New goals to be determined pending instrumental  study       Donnivan Villena I. Hardin Negus, [place], [place] Office number [phone] Pager [place] 02/13/2020, 12:05 PM

## 2020-02-13 NOTE — Plan of Care (Signed)
  Problem: Education: Goal: Knowledge of General Education information will improve Description: Including pain rating scale, medication(s)/side effects and non-pharmacologic comfort measures Outcome: Progressing   Problem: Health Behavior/Discharge Planning: Goal: Ability to manage health-related needs will improve Outcome: Progressing   Problem: Clinical Measurements: Goal: Ability to maintain clinical measurements within normal limits will improve Outcome: Progressing   Problem: Clinical Measurements: Goal: Cardiovascular complication will be avoided Outcome: Progressing   Problem: Clinical Measurements: Goal: Respiratory complications will improve Outcome: Progressing   

## 2020-02-13 NOTE — Progress Notes (Signed)
Pulse rate 78 BP 123/96

## 2020-02-13 NOTE — Progress Notes (Signed)
Patiet's heart rate sustaining in the 150's.  Dr Myna Hidalgo notified,  To give IV metoprolol 5mg  stat and every 2 hours as needed.  Same given

## 2020-02-14 ENCOUNTER — Inpatient Hospital Stay (HOSPITAL_COMMUNITY): Payer: Medicare Other

## 2020-02-14 LAB — COMPREHENSIVE METABOLIC PANEL
ALT: 55 U/L — ABNORMAL HIGH (ref 0–44)
AST: 55 U/L — ABNORMAL HIGH (ref 15–41)
Albumin: 2.6 g/dL — ABNORMAL LOW (ref 3.5–5.0)
Alkaline Phosphatase: 63 U/L (ref 38–126)
Anion gap: 9 (ref 5–15)
BUN: 10 mg/dL (ref 8–23)
CO2: 25 mmol/L (ref 22–32)
Calcium: 8.6 mg/dL — ABNORMAL LOW (ref 8.9–10.3)
Chloride: 104 mmol/L (ref 98–111)
Creatinine, Ser: 0.76 mg/dL (ref 0.61–1.24)
GFR calc Af Amer: >60 mL/min (ref >60)
GFR calc non Af Amer: >60 mL/min (ref >60)
Glucose, Bld: 166 mg/dL — ABNORMAL HIGH (ref 70–99)
Potassium: 3.9 mmol/L (ref 3.5–5.1)
Sodium: 138 mmol/L (ref 135–145)
Total Bilirubin: 1.8 mg/dL — ABNORMAL HIGH (ref 0.3–1.2)
Total Protein: 6.1 g/dL — ABNORMAL LOW (ref 6.5–8.1)

## 2020-02-14 LAB — CBC
HCT: 31.6 % — ABNORMAL LOW (ref 39.0–52.0)
Hemoglobin: 10 g/dL — ABNORMAL LOW (ref 13.0–17.0)
MCH: 30.7 pg (ref 26.0–34.0)
MCHC: 31.6 g/dL (ref 30.0–36.0)
MCV: 96.9 fL (ref 80.0–100.0)
Platelets: 189 10*3/uL (ref 150–400)
RBC: 3.26 MIL/uL — ABNORMAL LOW (ref 4.22–5.81)
RDW: 15.6 % — ABNORMAL HIGH (ref 11.5–15.5)
WBC: 7.1 10*3/uL (ref 4.0–10.5)
nRBC: 0 % (ref 0.0–0.2)

## 2020-02-14 LAB — GLUCOSE, CAPILLARY
Glucose-Capillary: 149 mg/dL — ABNORMAL HIGH (ref 70–99)
Glucose-Capillary: 191 mg/dL — ABNORMAL HIGH (ref 70–99)
Glucose-Capillary: 159 mg/dL — ABNORMAL HIGH (ref 70–99)
Glucose-Capillary: 130 mg/dL — ABNORMAL HIGH (ref 70–99)

## 2020-02-14 LAB — MAGNESIUM: Magnesium: 1.8 mg/dL (ref 1.7–2.4)

## 2020-02-14 MED ORDER — MAGNESIUM SULFATE 2 GM/50ML IV SOLN
2.0000 g | Freq: Once | INTRAVENOUS | Status: AC
  Administered 2020-02-14: 2 g via INTRAVENOUS
  Filled 2020-02-14: qty 50

## 2020-02-14 MED ORDER — SODIUM CHLORIDE 0.9 % IV SOLN
INTRAVENOUS | Status: DC | PRN
  Administered 2020-02-14: 250 mL via INTRAVENOUS

## 2020-02-14 MED ORDER — LOPERAMIDE HCL 2 MG PO CAPS
2.0000 mg | ORAL_CAPSULE | Freq: Once | ORAL | Status: AC
  Administered 2020-02-14: 2 mg via ORAL
  Filled 2020-02-14: qty 1

## 2020-02-14 MED ORDER — ENSURE ENLIVE PO LIQD
237.0000 mL | Freq: Three times a day (TID) | ORAL | Status: DC
  Administered 2020-02-14 – 2020-02-17 (×7): 237 mL via ORAL

## 2020-02-14 NOTE — Progress Notes (Signed)
Physical Therapy Treatment Patient Details Name: Adam [name] MRN: [id number] DOB: Mar 25, 1951 Today's Date: 02/14/2020    History of Present Illness Pt is a 69 y.o. male with history of diabetes mellitus type 2, CAD status post CABG, A. fib not on anticoagulation probably from risk of falls with history of hypertension alcohol abuse presents to the ER the patient had a fall at home. Pt with frequent falls recently and drinking every day. Pt in afib with RVR and DKA in ED. CIWA protocol initiated.    PT Comments    Pt was difficult to awaken.  Used warm up exercise, cold wash cloth and questioning to give pt a chance to arouse.  Plan was to transition to EOB and work on standing tasks.  Pt changed the task to transfer to [facility], but he was incontinent before the transfer was started.  Pt was returned to supine and rolled L/R for peri care, with pt getting less helpful within clean up session.    Follow Up Recommendations  SNF;Supervision/Assistance - 24 hour     Equipment Recommendations  Other (comment)(TBA next venue)    Recommendations for Other Services       Precautions / Restrictions Precautions Precautions: Fall Precaution Comments: CIWA    Mobility  Bed Mobility Overal bed mobility: Needs Assistance Bed Mobility: Rolling;Sidelying to Sit;Sit to Supine Rolling: Max assist Sidelying to sit: Max assist;+2 for physical assistance   Sit to supine: Max assist;+2 for physical assistance   General bed mobility comments: cuing for direction, truncal assist, helping with sequencing.  Transfers Overall transfer level: Needs assistance               General transfer comment: pt was set up to transfer to [facility], but was incontinent of runny stool in the bed before the transfer and therefore was layed back down for peri care.  Ambulation/Gait             General Gait Details: not able due to lethargy, confusion and incontinence episode.   Stairs              Wheelchair Mobility    Modified Rankin (Stroke Patients Only)       Balance Overall balance assessment: Needs assistance Sitting-balance support: Feet supported;Single extremity supported Sitting balance-Leahy Scale: Poor Sitting balance - Comments: heavy list to the left with max assist to maintain midline.                                    Cognition Arousal/Alertness: Awake/alert Behavior During Therapy: Flat affect Overall Cognitive Status: Impaired/Different from baseline                   Orientation Level: Situation;Time;Place Current Attention Level: Sustained Memory: Decreased short-term memory Following Commands: Follows one step commands with increased time Safety/Judgement: Decreased awareness of deficits;Decreased awareness of safety Awareness: Intellectual Problem Solving: Slow processing;Decreased initiation;Difficulty sequencing;Requires verbal cues        Exercises Other Exercises Other Exercises: warm up hip/knee flex/ext ROM with graded resistance    General Comments        Pertinent Vitals/Pain Pain Assessment: Faces Faces Pain Scale: No hurt Pain Intervention(s): Monitored during session    Home Living                      Prior Function  PT Goals (current goals can now be found in the care plan section) Acute Rehab PT Goals Patient Stated Goal: not stated PT Goal Formulation: With patient Time For Goal Achievement: 02/23/20 Potential to Achieve Goals: Fair Progress towards PT goals: Not progressing toward goals - comment(incontinent, confused and lethargic)    Frequency    Min 3X/week      PT Plan Current plan remains appropriate    Co-evaluation              AM-PAC PT "6 Clicks" Mobility   Outcome Measure  Help needed turning from your back to your side while in a flat bed without using bedrails?: A Lot Help needed moving from lying on your back to sitting on the side  of a flat bed without using bedrails?: Total Help needed moving to and from a bed to a chair (including a wheelchair)?: Total Help needed standing up from a chair using your arms (e.g., wheelchair or bedside chair)?: Total Help needed to walk in hospital room?: Total Help needed climbing 3-5 steps with a railing? : Total 6 Click Score: 7    End of Session   Activity Tolerance: Patient limited by fatigue Patient left: in bed;with call bell/phone within reach Nurse Communication: Mobility status PT Visit Diagnosis: Other abnormalities of gait and mobility (R26.89);Difficulty in walking, not elsewhere classified (R26.2)     Time: [id number] PT Time Calculation (min) (ACUTE ONLY): 30 min  Charges:  $Therapeutic Activity: 8-22 mins                     02/14/2020  Ginger Carne., PT Acute Rehabilitation Services [phone]  (pager) [phone]  (office)   Tessie Fass Chapin Arduini 02/14/2020, 12:36 PM

## 2020-02-14 NOTE — Progress Notes (Signed)
  Speech Language Pathology Treatment: Dysphagia  Patient Details Name: Adam [name] MRN: [id number] DOB: 1951-05-14 Today's Date: 02/14/2020 Time: [id number] SLP Time Calculation (min) (ACUTE ONLY): 20 min  Assessment / Plan / Recommendation Clinical Impression  Pt was seen for dysphagia treatment with his wife present. Pt stated that he did not recall completing the swallow study this morning or the results/recommendations thereof. Pt and his wife were educated regarding the results of the modified barium swallow study, diet recommendations, and compensatory strategies to reduce aspiration risk. Video recording of the study was used to facilitate understanding and the pt's wife verbalized understanding as well as agreement with results, recommendations, and plan of care. Pt requested that p.o. intake be deferred since he was not hungry and that exercises not be completed since he was tired. SLP will continue to follow pt.    HPI HPI: Adam [name] is a 69 y.o. male with history of diabetes mellitus type 2, CAD status post CABG, A. fib not on anticoagulation probably from risk of falls with history of hypertension alcohol abuse presents to the ER the patient had a fall at home.  MRI was negative for acute intracranial abnormality.       SLP Plan  Continue with current plan of care       Recommendations  Diet recommendations: Regular;Thin liquid Liquids provided via: Cup;Straw Medication Administration: Whole meds with puree Supervision: Staff to assist with self feeding Compensations: Slow rate;Small sips/bites(Individual sips only) Postural Changes and/or Swallow Maneuvers: Seated upright 90 degrees                Oral Care Recommendations: Oral care BID Follow up Recommendations: None SLP Visit Diagnosis: Dysphagia, unspecified (R13.10) Plan: Continue with current plan of care       Baldo Hufnagle I. Hardin Negus, [place], [place] Office number  [phone] Pager [phone]                Horton Marshall 02/14/2020, 5:42 PM

## 2020-02-14 NOTE — Progress Notes (Signed)
Nutrition Follow-up  DOCUMENTATION CODES:   Obesity unspecified  INTERVENTION:   -Ensure Enlive po TID, each supplement provides 350 kcal and 20 grams of protein -MVI with minerals daily -Magic cup TID with meals, each supplement provides 290 kcal and 9 grams of protein  NUTRITION DIAGNOSIS:   Inadequate oral intake related to lethargy/confusion(Wenicke's/Korsakoff encephalopathy) as evidenced by percent weight loss, meal completion < 25%.  Ongoing  GOAL:   Patient will meet greater than or equal to 90% of their needs  Progressing   MONITOR:   Labs, I & O's, Supplement acceptance, PO intake, Weight trends  REASON FOR ASSESSMENT:   Malnutrition Screening Tool    ASSESSMENT:   69 year old male with past medical history of DM2, CAD s/p CABG, atrial fibrillation, HTN, alcohol abuse admitted for atrial fibrillation with RVR after presenting for evaluation of frequent falls.  5/13- s/p MBSS- advanced to regular consistency diet with thin liquids  Reviewed I/O's: +757 ml x 24 hours and +7.9 L since admission  UOP: 250 ml x 24 hours  Attempted to speak with pt x 2, however, receiving nursing care at time of visit.  Pt remains with poor oral intake. Noted meal completion 0-25%.   Per [facility] team notes, pt will require SNF placement.   Medications reviewed and include folic acid and thiamine.   Pt with poor oral intake and would benefit from nutrient dense supplement. One Ensure Enlive supplement provides 350 kcals, 20 grams protein, and 44-45 grams of carbohydrate vs one Glucerna shake supplement, which provides 220 kcals, 10 grams of protein, and 26 grams of carbohydrate. Given pt's hx of DM, RD will reassess adequacy of PO intake, CBGS, and adjust supplement regimen as appropriate at follow-up.   Labs reviewed: CBGS: 191 (inpatient orders for glycemic control are 0-9 units insulin aspart TID with meals and 8 units inuslin detemir BID).   Diet Order:   Diet Order       Diet heart healthy/carb modified Room service appropriate? Yes; Fluid consistency: Thin  Diet effective now              EDUCATION NEEDS:   Not appropriate for education at this time  Skin:  Skin Assessment: Skin Integrity Issues: Skin Integrity Issues:: Other (Comment) Other: MASD buttocks  Last BM:  02/14/20  Height:   Ht Readings from Last 1 Encounters:  02/09/20 5\' 10"  (1.778 m)    Weight:   Wt Readings from Last 1 Encounters:  02/14/20 112.9 kg   BMI:  Body mass index is 35.73 kg/m.  Estimated Nutritional Needs:   Kcal:  2200-2400  Protein:  110-120  Fluid:  >/= 2.2 L/day    Loistine Chance, RD, LDN, CDCES Registered Dietitian II Certified Diabetes Care and Education Specialist Please refer to [facility] for RD and/or RD on-call/weekend/after hours pager

## 2020-02-14 NOTE — TOC Progression Note (Addendum)
Transition of Care [facility]) - Progression Note    Patient Details  Name: Adam [name] MRN: [id number] Date of Birth: Dec 01, 1950  Transition of Care [facility]) CM/SW [place], [place] Phone Number: 02/14/2020, 3:03 PM  Clinical Narrative:     Patients PASRR is under review [place] must ID number [id number]. CSW faxed out clinicals. CSW will start insurance authorization when SNF choice is made for patient.  Bed offer pending, PASRR number pending, Will need to get insurance authorization once SNF choice is made.    Expected Discharge Plan: [place] Barriers to Discharge: Continued Medical Work up  Expected Discharge Plan and Services Expected Discharge Plan: [place] arrangements for the past 2 months: Single Family Home                                       Social Determinants of Health (SDOH) Interventions    Readmission Risk Interventions No flowsheet data found.

## 2020-02-14 NOTE — Progress Notes (Signed)
[facility] PROGRESS NOTE    Progress Note  Adam [name]  [id number] DOB: 27-Oct-1950 DOA: 02/08/2020 PCP: [name], MD   Brief Narrative:   Adam [name] is an 69 y.o. male past medical history significant for diabetes mellitus type 2 CAD status post CABG, atrial fibrillation not on anticoagulation, essential hypertension heavy alcohol abuse comes into the ED for frequent falls, and confusion, in the ED he was found to be in A. 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 fib, followed by EP Dr. Rayann Heman -EP note from 11/30/2019 reviewed, patient declined anticoagulation and said he would never go back on the blood thinner and would take his chances despite stroke risk -Started on IV Cardizem yesterday, heart rate improved, continue p.o. metoprolol and stop Cardizem drip today -Discussed anticoagulation again with patient, he adamantly declines this -Poor long-term anticoagulation candidate given chronic alcoholism as  well  DM 2 -CBGs improving, continue Lantus and sliding scale -Hemoglobin A1c 6.6  Hypokalemia Hypomagnesemia -Replaced  CAD (coronary artery disease): Continue metoprolol and aspirin  COPD: Noted.   Small laceration on the right side of the forehead: No active bleeding CT of the head unremarkable.  Essential hypertension: Continue Cozaar and metoprolol.  Protein calorie malnutrition: Continue Ensure 3 times daily  Code status: DNR, discussed with wife 5/12 DVT prophylaxis: lovenox Family Communication:no family at bedside, called and updated spouse 5/12 Status is: Inpatient  Remains inpatient appropriate because: Encephalopathy, A. fib RVR   Dispo: The patient is from: Home              Anticipated d/c is to: SNF              Anticipated d/c date is: 2 days              Patient currently is not medically stable to d/c.   Code Status:     Code Status Orders  (From admission, onward)         Start     Ordered   02/08/20 2245  Full code  Continuous     02/08/20 2245        Code Status History    Date Active Date Inactive Code Status Order ID Comments User Context   10/25/2019 0015 10/27/2019 2015 Full Code [id number]  [name], MD ED   09/12/2018 1829 09/13/2018 1905 Full Code [id number]  [name] Inpatient   03/31/2018 1630 04/01/2018 0015 Full Code [id number]  [name], MD Inpatient   12/12/2017 1624 12/18/2017 1801 Full Code [id number]  [name], MD Inpatient   12/12/2017 1622 12/12/2017 1624 Full Code [id number]  [name], MD Inpatient  Advance Care Planning Activity        IV Access:    Peripheral IV   Procedures and diagnostic studies:   No results found.   Medical Consultants:    None.  Anti-Infectives:   none  Subjective:   -Overnight back in rapid A. fib, remains lethargic this morning, did get scheduled Ativan at 6 AM  Objective:    Vitals:   02/13/20 1915 02/14/20 0108 02/14/20 0349  02/14/20 0729  BP: 130/80 (!) 107/48 133/84   Pulse: 80 64 98   Resp: 18 18 19    Temp: 97.7 F (36.5 C) 98.6 F (37 C) 97.7 F (36.5 C)   TempSrc: Oral  Oral   SpO2: 98% 100% 99% 95%  Weight:   112.9 kg   Height:       SpO2: 95 % O2 Flow Rate (L/min): 2 L/min   Intake/Output Summary (Last 24 hours) at 02/14/2020 1101 Last data filed at 02/14/2020 0851 Gross per 24 hour  Intake 886.96 ml  Output 250 ml  Net 636.96 ml   Filed Weights   02/12/20 0100 02/13/20 0449 02/14/20 0349  Weight: 111.1 kg 112 kg 112.9 kg    Exam Averagely built male, laying in bed, much more alert and interactive, oriented to self, place and partly to time HEENT pupils are reactive CVS S1-S2 regular rate rhythm Lungs with diminished breath sounds both bases Abdomen soft nontender with normal bowel sounds Extremities with no edema Neuro moves all extremities, follows commands  Skin: No rashes on exposed skin Psychiatry: Flat affect Data Reviewed:    Labs: Basic Metabolic Panel: Recent Labs  Lab 02/09/20 1117 02/09/20 1117 02/10/20 0436 02/10/20 0436 02/10/20 1634 02/10/20 1634 02/11/20 0603 02/11/20 0603 02/12/20 0514 02/12/20 0514 02/13/20 0355 02/14/20 0354  NA 139   < > 143   < > 144  --  140  --  142  --  139 138  K 3.9   < > 3.4*   < > 3.9   < > 3.6   < > 4.0   < > 4.0 3.9  CL 100   < > 108   < > 108  --  104  --  106  --  101 104  CO2 22   < > 25   < > 27  --  24  --  25  --  25 25  GLUCOSE 174*   < > 144*   < > 195*  --  171*  --  229*  --  246* 166*  BUN 11   < > 9   < > 11  --  9  --  10  --  12 10  CREATININE 0.94   < > 0.85   < > 0.79  --  0.73  --  0.77  --  0.85 0.76  CALCIUM 9.6   < > 8.9   < > 9.1  --  9.1  --  9.1  --  8.9 8.6*  MG 1.8   < > 2.2  --   --   --  1.9  --  1.9  --  2.2 1.8  PHOS 1.4*  --   --   --   --   --  2.3*  2.2*  --   --   --   --   --    < > = values in this interval not displayed.   GFR Estimated Creatinine Clearance: 109.7 mL/min (by C-G  formula based on SCr of 0.76 mg/dL). Liver Function Tests: Recent Labs  Lab 02/08/20 2015 02/09/20 1117 02/14/20 0354  AST 39 31 55*  ALT 29 25 55*  ALKPHOS 80 63 63  BILITOT 1.7* 1.6* 1.8*  PROT 8.2* 6.0* 6.1*  ALBUMIN 4.1 3.3* 2.6*   Recent Labs  Lab 02/08/20 2015  LIPASE 61*   Recent Labs  Lab 02/12/20 1137  AMMONIA 46*   Coagulation profile Recent Labs  Lab 02/11/20 0603  INR 1.0   COVID-19 Labs  No results for input(s): DDIMER, FERRITIN, LDH, CRP in the last 72 hours.  Lab Results  Component Value Date   SARSCOV2NAA NEGATIVE 02/08/2020   [place] NEGATIVE 10/24/2019    CBC: Recent Labs  Lab 02/08/20 2015 02/08/20 2015 02/08/20 2043 02/08/20 2244 02/09/20 1117 02/14/20 0354  WBC 11.7*  --   --  13.4* 7.8 7.1  NEUTROABS 9.2*  --   --   --   --   --   HGB 15.1  --  15.6 12.6* 12.9* 10.0*  HCT 46.5   < > 46.0 38.8* 40.0  38.5 31.6*  MCV 93.0  --   --  93.7 92.8 96.9  PLT 296  --   --  246 232 189   < > = values in this interval not displayed.   Cardiac Enzymes: Recent Labs  Lab 02/11/20 0603  CKTOTAL 47*   BNP (last 3 results) Recent Labs    12/03/19 1502  PROBNP 266.0*   CBG: Recent Labs  Lab 02/13/20 0632 02/13/20 1153 02/13/20 1610 02/13/20 2241 02/14/20 0650  GLUCAP 196* 277* 156* 147* 149*   D-Dimer: No results for input(s): DDIMER in the last 72 hours. Hgb A1c: No results for input(s): HGBA1C in the last 72 hours. Lipid Profile: No results for input(s): CHOL, HDL, LDLCALC, TRIG, CHOLHDL, LDLDIRECT in the last 72 hours. Thyroid function studies: No results for input(s): TSH, T4TOTAL, T3FREE, THYROIDAB in the last 72 hours.  Invalid input(s): FREET3 Anemia work up: No results for input(s): VITAMINB12, FOLATE, FERRITIN, TIBC, IRON, RETICCTPCT in the last 72 hours. Sepsis Labs: Recent Labs  Lab 02/08/20 2015 02/08/20 2222 02/08/20 2244 02/09/20 0802 02/09/20 1117 02/14/20 0354  WBC 11.7*  --  13.4*  --  7.8 7.1   LATICACIDVEN  --  2.3*  --  1.3 1.9  --    Microbiology Recent Results (from the past 240 hour(s))  Respiratory Panel by RT PCR (Flu A&B, Covid) - Nasopharyngeal Swab     Status: None   Collection Time: 02/08/20 10:26 PM   Specimen: Nasopharyngeal Swab  Result Value Ref Range Status   SARS Coronavirus 2 by RT PCR NEGATIVE NEGATIVE Final    Comment: (NOTE) SARS-CoV-2 target nucleic acids are NOT DETECTED. The SARS-CoV-2 RNA is generally detectable in upper respiratoy specimens during the acute phase of infection. The lowest concentration of SARS-CoV-2 viral copies this assay can detect is 131 copies/mL. A negative result does not preclude SARS-Cov-2 infection and should not be used as the sole basis for treatment or other patient management decisions. A negative result may occur with  improper specimen collection/handling, submission of specimen other than nasopharyngeal swab, presence of viral mutation(s) within the areas targeted by this assay, and inadequate number of viral copies (<131 copies/mL). A negative result must be combined with clinical observations, patient history, and epidemiological information. The expected result is Negative. Fact Sheet for Patients:  PinkCheek.be Fact Sheet for Healthcare Providers:  GravelBags.it This test is  not yet ap proved or cleared by the [place] and  has been authorized for detection and/or diagnosis of SARS-CoV-2 by FDA under an Emergency Use Authorization (EUA). This EUA will remain  in effect (meaning this test can be used) for the duration of the COVID-19 declaration under Section 564(b)(1) of the Act, 21 U.S.C. section 360bbb-3(b)(1), unless the authorization is terminated or revoked sooner.    Influenza A by PCR NEGATIVE NEGATIVE Final   Influenza B by PCR NEGATIVE NEGATIVE Final    Comment: (NOTE) The Xpert Xpress SARS-CoV-2/FLU/RSV assay is intended as an aid  in  the diagnosis of influenza from Nasopharyngeal swab specimens and  should not be used as a sole basis for treatment. Nasal washings and  aspirates are unacceptable for Xpert Xpress SARS-CoV-2/FLU/RSV  testing. Fact Sheet for Patients: PinkCheek.be Fact Sheet for Healthcare Providers: GravelBags.it This test is not yet approved or cleared by the [place] FDA and  has been authorized for detection and/or diagnosis of SARS-CoV-2 by  FDA under an Emergency Use Authorization (EUA). This EUA will remain  in effect (meaning this test can be used) for the duration of the  Covid-19 declaration under Section 564(b)(1) of the Act, 21  U.S.C. section 360bbb-3(b)(1), unless the authorization is  terminated or revoked. Performed at [facility], [place] [address] [place], [place] [address]      Medications:   . allopurinol  300 mg Oral Daily  . aspirin  81 mg Oral Daily  . dofetilide  125 mcg Oral BID  . DULoxetine  60 mg Oral Daily  . enoxaparin (LOVENOX) injection  40 mg Subcutaneous Q24H  . fenofibrate  160 mg Oral Daily  . fluticasone furoate-vilanterol  1 puff Inhalation Daily  . folic acid  1 mg Oral Daily  . icosapent Ethyl  2 g Oral BID  . insulin aspart  0-9 Units Subcutaneous TID WC  . insulin detemir  8 Units Subcutaneous BID  . loperamide  2 mg Oral Once  . metoprolol tartrate  100 mg Oral BID  . multivitamin with minerals  1 tablet Oral Daily  . pantoprazole  40 mg Oral BID  . potassium chloride  20 mEq Oral BID  . rosuvastatin  40 mg Oral Daily  . sodium chloride flush  10-40 mL Intracatheter Q12H  . thiamine  100 mg Oral Daily   Or  . thiamine  100 mg Intravenous Daily  . umeclidinium bromide  1 puff Inhalation Daily   Continuous Infusions:  Time spent: 35 minutes   LOS: 6 days   [facility]  02/14/2020, 11:01 AM

## 2020-02-14 NOTE — Progress Notes (Signed)
Patient off of unit to procedure.

## 2020-02-14 NOTE — Evaluation (Signed)
Occupational Therapy Evaluation Patient Details Name: Adam [name] MRN: [id number] DOB: 16-Dec-1950 Today's Date: 02/14/2020    History of Present Illness Pt is a 69 y.o. male with history of diabetes mellitus type 2, CAD status post CABG, A. fib not on anticoagulation probably from risk of falls with history of hypertension alcohol abuse presents to the ER the patient had a fall at home. Pt with frequent falls recently and drinking every day. Pt in afib with RVR and DKA in ED. CIWA protocol initiated.   Clinical Impression   PTA pt living with spouse, independent for BADL, but with many frequent falls leading up to admission. At time of eval, pt needed increased time and forms of stimulation to arouse and participate in session. Pt completed bed mobility at max A +2. Once EOB pt required intermittent min-mod truncal support due to L lean. Attempted to initiate BSC transfer, but pt incontinent of stool prior and needing to lie back down to complete total A peri care. Noted cognitive deficits in orientation, memory, command following, awareness, attention, and problem solving. Short frequent cues are needed throughout session to maintain safety. Given current status, recommend SNF at d/c for continued safe progression of BADL activity prior to returning home. OT will continue to follow acutely per POC listed below.     Follow Up Recommendations  SNF;Supervision/Assistance - 24 hour    Equipment Recommendations  Other (comment)(defer to next venue)    Recommendations for Other Services       Precautions / Restrictions Precautions Precautions: Fall Precaution Comments: CIWA Restrictions Weight Bearing Restrictions: No      Mobility Bed Mobility Overal bed mobility: Needs Assistance Bed Mobility: Rolling;Sidelying to Sit;Sit to Supine Rolling: Max assist   Supine to sit: Max assist;+2 for physical assistance;HOB elevated Sit to supine: Max assist;+2 for physical assistance    General bed mobility comments: cuing for direction, truncal assist, help with sequencing. Bed pad used to assist with hips  Transfers Overall transfer level: Needs assistance               General transfer comment: pt was set up to transfer to [facility], but was incontinent of runny stool in the bed before the transfer and therefore was layed back down for peri care.    Balance Overall balance assessment: Needs assistance Sitting-balance support: Feet supported;Single extremity supported Sitting balance-Leahy Scale: Poor Sitting balance - Comments: heavy lean to the left with max assist to maintain midline. Postural control: Left lateral lean                                 ADL either performed or assessed with clinical judgement   ADL Overall ADL's : Needs assistance/impaired Eating/Feeding: Moderate assistance;Bed level;Cueing for safety;Cueing for sequencing   Grooming: Minimal assistance;Sitting Grooming Details (indicate cue type and reason): sitting EOB to wash face, assist for thorough task completion Upper Body Bathing: Maximal assistance;Sitting   Lower Body Bathing: Maximal assistance;Sitting/lateral leans;Sit to/from stand   Upper Body Dressing : Maximal assistance;Sitting   Lower Body Dressing: Maximal assistance;Sitting/lateral leans;Sit to/from Health and safety inspector Details (indicate cue type and reason): pt was incontinent of stool prior to initiating transfer, EOB only this date Toileting- Water quality scientist and Hygiene: Total assistance;Bed level Toileting - Clothing Manipulation Details (indicate cue type and reason): total A bed level to clean peri area     Functional mobility during ADLs: Maximal  assistance;+2 for physical assistance;+2 for safety/equipment(EOB only this date) General ADL Comments: limited in BADL by cognitive status. Was not able to progress to OOB due to energy level and incontinence episode     Vision Patient  Visual Report: No change from baseline       Perception     Praxis      Pertinent Vitals/Pain Pain Assessment: Faces Faces Pain Scale: No hurt     Hand Dominance     Extremity/Trunk Assessment Upper Extremity Assessment Upper Extremity Assessment: Generalized weakness(ataxic)   Lower Extremity Assessment Lower Extremity Assessment: Defer to PT evaluation       Communication     Cognition Arousal/Alertness: Awake/alert Behavior During Therapy: Flat affect Overall Cognitive Status: Impaired/Different from baseline Area of Impairment: Orientation;Attention;Memory;Following commands;Safety/judgement;Awareness;Problem solving                 Orientation Level: Situation;Time;Place Current Attention Level: Sustained Memory: Decreased short-term memory Following Commands: Follows one step commands with increased time Safety/Judgement: Decreased awareness of deficits;Decreased awareness of safety Awareness: Intellectual Problem Solving: Slow processing;Decreased initiation;Difficulty sequencing;Requires verbal cues General Comments: Pt had stated he needed to use commode, then stated he needed to lay back down after becomming incontinent of stool. Increased safety and task related cues needed throughout session   General Comments       Exercises     Shoulder Instructions      Home Living Family/patient expects to be discharged to:: Private residence Living Arrangements: Spouse/significant other Available Help at Discharge: Family;Available PRN/intermittently(spouse works during day) Type of Home: House Home Access: Stairs to enter [employer] of Steps: 4 Entrance Stairs-Rails: Can reach both Home Layout: One level     Bathroom Shower/Tub: Teacher, early years/pre: Standard     Home Equipment: Environmental consultant - 2 wheels;Cane - single point          Prior Functioning/Environment Level of Independence: Independent        Comments: retired  from [employer] work        OT Problem List: Decreased strength;Decreased knowledge of use of DME or AE;Decreased activity tolerance;Decreased cognition;Impaired balance (sitting and/or standing);Decreased safety awareness;Decreased knowledge of precautions      OT Treatment/Interventions: Self-care/ADL training;Therapeutic exercise;Patient/family education;Balance training;Energy conservation;Therapeutic activities;DME and/or AE instruction;Cognitive remediation/compensation    OT Goals(Current goals can be found in the care plan section) Acute Rehab OT Goals OT Goal Formulation: Patient unable to participate in goal setting Time For Goal Achievement: 02/28/20 Potential to Achieve Goals: Fair  OT Frequency: Min 2X/week   Barriers to D/C:            Co-evaluation PT/OT/SLP Co-Evaluation/Treatment: Yes Reason for Co-Treatment: For patient/therapist safety;To address functional/ADL transfers   OT goals addressed during session: ADL's and self-care;Strengthening/ROM      AM-PAC OT "6 Clicks" Daily Activity     Outcome Measure Help from another person eating meals?: A Lot Help from another person taking care of personal grooming?: A Little Help from another person toileting, which includes using toliet, bedpan, or urinal?: Total Help from another person bathing (including washing, rinsing, drying)?: Total Help from another person to put on and taking off regular upper body clothing?: A Lot Help from another person to put on and taking off regular lower body clothing?: A Lot 6 Click Score: 11   End of Session Equipment Utilized During Treatment: Gait belt Nurse Communication: Mobility status  Activity Tolerance: Patient limited by fatigue Patient left: in bed;with call bell/phone within reach  OT Visit Diagnosis: Unsteadiness on feet (R26.81);Other abnormalities of gait and mobility (R26.89);Muscle weakness (generalized) (M62.81);Other symptoms and signs involving cognitive  function                Time: [id number] OT Time Calculation (min): 30 min Charges:  OT General Charges $OT Visit: 1 Visit OT Evaluation $OT Eval Moderate Complexity: Shell, MSOT, OTR/L Acute Rehabilitation Services [facility] Office Number: [phone] Pager: [phone]  Zenovia Jarred 02/14/2020, 5:13 PM

## 2020-02-14 NOTE — Progress Notes (Signed)
Patient returned to unit around 0945.

## 2020-02-14 NOTE — TOC Initial Note (Signed)
Transition of Care [facility]) - Initial/Assessment Note    Patient Details  Name: Adam [name] MRN: [id number] Date of Birth: 1950/12/11  Transition of Care [facility]) CM/SW Contact:    Adam [name], [place] Phone Number: 02/14/2020, 1:53 PM  Clinical Narrative:      CSW spoke with patients spouse Adam [name] who is agreeable to SNF placement for patient. Patients wife Adam [name] gave CSW permission to fax out initial referral to [facility] area for possible SNF placement. CSW will also start insurance authorization for patient.  Bed offers pending, insurance authorization pending.          Expected Discharge Plan: Skilled Nursing Facility Barriers to Discharge: Continued Medical Work up   Patient Goals and CMS Choice Patient states their goals for this hospitalization and ongoing recovery are:: to go to skilled nursing facility CMS Medicare.gov Compare Post Acute Care list provided to:: Patient Represenative (must comment)(Spouse Adam [name]) Choice offered to / list presented to : Spouse(Adam [name])  Expected Discharge Plan and Services Expected Discharge Plan: [place]       Living arrangements for the past 2 months: Single Family Home                                      Prior Living Arrangements/Services Living arrangements for the past 2 months: Single Family Home Lives with:: Self, Spouse Patient language and need for interpreter reviewed:: Yes Do you feel safe going back to the place where you live?: No   SNF  Need for Family Participation in Patient Care: Yes (Comment) Care giver support system in place?: Yes (comment)   Criminal Activity/Legal Involvement Pertinent to Current Situation/Hospitalization: No - Comment as needed  Activities of Daily Living Home Assistive Devices/Equipment: Walker (specify type) ADL Screening (condition at time of admission) Patient's cognitive ability adequate to safely complete daily activities?: Yes Is the patient deaf or have  difficulty hearing?: No Does the patient have difficulty seeing, even when wearing glasses/contacts?: No Does the patient have difficulty concentrating, remembering, or making decisions?: No Patient able to express need for assistance with ADLs?: Yes Does the patient have difficulty dressing or bathing?: No Independently performs ADLs?: Yes (appropriate for developmental age) Does the patient have difficulty walking or climbing stairs?: Yes Weakness of Legs: Both Weakness of Arms/Hands: None  Permission Sought/Granted Permission sought to share information with : Case Manager, Family Supports, Customer service manager                Emotional Assessment       Orientation: : Oriented to Self Alcohol / Substance Use: Alcohol Use Psych Involvement: No (comment)  Admission diagnosis:  Atrial fibrillation with RVR ([place]) [I48.91] Contusion of face, initial encounter [S00.83XA] Diabetic ketoacidosis without coma associated with type 2 diabetes mellitus ([place]) [E11.10] Patient Active Problem List   Diagnosis Date Noted  . Atrial fibrillation with RVR ([place]) 02/08/2020  . High anion gap metabolic acidosis 37/90/2409  . Secondary hypercoagulable state ([place]) 11/02/2019  . Alcohol withdrawal ([place]) 10/24/2019  . Paroxysmal atrial fibrillation ([place]) 10/24/2019  . Rectal bleeding 10/24/2019  . Thoracic aortic aneurysm ([place]) 02/08/2019  . Hemoptysis 10/06/2018  . S/P right THA, 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  . S/P hip replacement 09/12/2018  . Sleep apnea 01/09/2018  . Persistent atrial fibrillation 12/12/2017  . Allergic rhinitis 08/22/2015  . Family history of colon cancer in mother deceased age 75 07-17-2015  . Type 2 diabetes  mellitus, controlled ([place]) 07/17/2013  . COPD (chronic obstructive pulmonary disease) ([place]) 07/05/2013  . CAD (coronary artery disease) 06/26/2013  . Hypertension associated with diabetes ([place]) 06/26/2013  . Hyperlipidemia associated with type 2 diabetes mellitus ([place])  06/26/2013  . GERD (gastroesophageal reflux disease) 06/26/2013  . History of atrial fibrillation without current medication 06/26/2013  . Gout 06/26/2013  . Osteoarthritis 06/26/2013  . Asthma, mild persistent 06/26/2013  . Alcohol dependence ([place]) 06/26/2013  . PTSD (post-traumatic stress disorder) 06/26/2013  . HLA B27 (HLA B27 positive) 06/26/2013  . Obesity (BMI 30-39.9) 06/26/2013   PCP:  [name], MD Pharmacy:   [facility] [place], [place] - [address] [place] [address] Phone: [phone] Fax: [phone]     Social Determinants of Health (SDOH) Interventions    Readmission Risk Interventions No flowsheet data found.

## 2020-02-14 NOTE — Progress Notes (Signed)
  RE: Canon Wykle Date of Birth: [birth year] Date: 02/14/2020  To Whom It May Concern:  Please be advised that the above-named patient will require a short-term nursing home stay - anticipated 30 days or less for rehabilitation and strengthening.  The plan is for return home.

## 2020-02-14 NOTE — Plan of Care (Signed)
  Problem: Education: Goal: Knowledge of General Education information will improve Description: Including pain rating scale, medication(s)/side effects and non-pharmacologic comfort measures Outcome: Progressing   Problem: Health Behavior/Discharge Planning: Goal: Ability to manage health-related needs will improve Outcome: Progressing   Problem: Clinical Measurements: Goal: Ability to maintain clinical measurements within normal limits will improve Outcome: Progressing   Problem: Clinical Measurements: Goal: Cardiovascular complication will be avoided Outcome: Progressing   Problem: Coping: Goal: Level of anxiety will decrease Outcome: Progressing   Problem: Elimination: Goal: Will not experience complications related to bowel motility Outcome: Progressing

## 2020-02-14 NOTE — NC FL2 (Signed)
[place] MEDICAID FL2 LEVEL OF CARE SCREENING TOOL     IDENTIFICATION  Patient Name: Adam [name] Birthdate: [birth year] Sex: male Admission Date (Current Location): 02/08/2020  [facility] and [place] Number:  Herbalist and Address:  [facility] [place], [place] [address] [place], [place] [address]      Provider Number: [id number]  Attending Physician Name and Address:  Domenic Polite, MD  Relative Name and Phone Number:  [name] [phone]    Current Level of Care: Hospital Recommended Level of Care: [place] Prior Approval Number:    Date Approved/Denied:   PASRR Number: PASRR under review  Discharge Plan: SNF    Current Diagnoses: Patient Active Problem List   Diagnosis Date Noted  . Atrial fibrillation with RVR ([place]) 02/08/2020  . High anion gap metabolic acidosis 81/10/7508  . Secondary hypercoagulable state ([place]) 11/02/2019  . Alcohol withdrawal ([place]) 10/24/2019  . Paroxysmal atrial fibrillation ([place]) 10/24/2019  . Rectal bleeding 10/24/2019  . Thoracic aortic aneurysm ([place]) 02/08/2019  . Hemoptysis 10/06/2018  . S/P right THA, 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  . S/P hip replacement 09/12/2018  . Sleep apnea 01/09/2018  . Persistent atrial fibrillation 12/12/2017  . Allergic rhinitis 08/22/2015  . Family history of colon cancer in mother deceased age 47 July 27, 2015  . Type 2 diabetes mellitus, controlled ([place]) 07/17/2013  . COPD (chronic obstructive pulmonary disease) ([place]) 07/05/2013  . CAD (coronary artery disease) 06/26/2013  . Hypertension associated with diabetes ([place]) 06/26/2013  . Hyperlipidemia associated with type 2 diabetes mellitus ([place]) 06/26/2013  . GERD (gastroesophageal reflux disease) 06/26/2013  . History of atrial fibrillation without current medication 06/26/2013  . Gout 06/26/2013  . Osteoarthritis 06/26/2013  . Asthma, mild persistent 06/26/2013  . Alcohol dependence ([place]) 06/26/2013  . PTSD (post-traumatic  stress disorder) 06/26/2013  . HLA B27 (HLA B27 positive) 06/26/2013  . Obesity (BMI 30-39.9) 06/26/2013    Orientation RESPIRATION BLADDER Height & Weight     Self  O2(Nasal Cannula 2 liters) Incontinent, External catheter(External Urinary Catheter) Weight: 249 lb (112.9 kg) Height:  '5\' 10"'  (177.8 cm)  BEHAVIORAL SYMPTOMS/MOOD NEUROLOGICAL BOWEL NUTRITION STATUS      Incontinent(Type 7 with liquid consistency with no solid pieces) Diet(See discharge summary)  AMBULATORY STATUS COMMUNICATION OF NEEDS Skin   Extensive Assist Verbally Skin abrasions, Other (Comment)(Approp for ethnicity,dry,Abrasion, Ecchymosis Face;head;right;posterior face arm chest right left Eczema location leg right left skin turgor non-tenting)                       Personal Care Assistance Level of Assistance  Bathing, Feeding, Dressing Bathing Assistance: Maximum assistance Feeding assistance: Independent(able to feed self) Dressing Assistance: Maximum assistance     Functional Limitations Info  Sight, Hearing, Speech Sight Info: Adequate(Trauma to right eye) Hearing Info: Adequate Speech Info: Adequate    SPECIAL CARE FACTORS FREQUENCY  PT (By licensed PT), OT (By licensed OT)     PT Frequency: 5x min weekly OT Frequency: 5x min weekly            Contractures Contractures Info: Not present    Additional Factors Info  Code Status, Allergies Code Status Info: DNR Allergies Info: Xarelto,Amoxicillin,Augmentin amoxicillin-pot Clavulanate,Azithromycin,Clindamycin/lincomycin,Keflex cephalexin           Current Medications (02/14/2020):  This is the current hospital active medication list Current Facility-Administered Medications  Medication Dose Route Frequency Provider Last Rate Last Admin  . 0.9 %  sodium chloride infusion   Intravenous  PRN Domenic Polite, MD 10 mL/hr at 02/14/20 1321 250 mL at 02/14/20 1321  . allopurinol (ZYLOPRIM) tablet 300 mg  300 mg Oral Daily [name],  MD   300 mg at 02/14/20 1055  . alum & mag hydroxide-simeth (MAALOX/MYLANTA) 200-200-20 MG/5ML suspension 15 mL  15 mL Oral Q4H PRN Charlynne Cousins, MD   15 mL at 02/09/20 9758  . aspirin chewable tablet 81 mg  81 mg Oral Daily [name], MD   81 mg at 02/14/20 1055  . dofetilide (TIKOSYN) capsule 125 mcg  125 mcg Oral BID [name], MD   125 mcg at 02/14/20 1055  . DULoxetine (CYMBALTA) DR capsule 60 mg  60 mg Oral Daily [name], MD   60 mg at 02/14/20 1056  . enoxaparin (LOVENOX) injection 40 mg  40 mg Subcutaneous Q24H [name], MD   40 mg at 02/13/20 2242  . fenofibrate tablet 160 mg  160 mg Oral Daily [name], MD   160 mg at 02/14/20 1056  . fluticasone furoate-vilanterol (BREO ELLIPTA) 100-25 MCG/INH 1 puff  1 puff Inhalation Daily [name], MD   1 puff at 02/14/20 0729  . folic acid (FOLVITE) tablet 1 mg  1 mg Oral Daily Charlynne Cousins, MD   1 mg at 02/14/20 1056  . guaiFENesin-dextromethorphan (ROBITUSSIN DM) 100-10 MG/5ML syrup 5 mL  5 mL Oral Q4H PRN Domenic Polite, MD   5 mL at 02/13/20 2242  . icosapent Ethyl (VASCEPA) 1 g capsule 2 g  2 g Oral BID [name], MD   2 g at 02/14/20 1055  . insulin aspart (novoLOG) injection 0-9 Units  0-9 Units Subcutaneous TID WC Charlynne Cousins, MD   2 Units at 02/14/20 1156  . insulin detemir (LEVEMIR) injection 8 Units  8 Units Subcutaneous BID Domenic Polite, MD   8 Units at 02/14/20 1054  . metoprolol tartrate (LOPRESSOR) tablet 100 mg  100 mg Oral BID [name], MD   100 mg at 02/14/20 0841  . multivitamin with minerals tablet 1 tablet  1 tablet Oral Daily Charlynne Cousins, MD   1 tablet at 02/14/20 1056  . pantoprazole (PROTONIX) EC tablet 40 mg  40 mg Oral BID Charlynne Cousins, MD   40 mg at 02/14/20 1055  . potassium chloride SA (KLOR-CON) CR tablet 20 mEq  20 mEq Oral BID Charlynne Cousins, MD   20 mEq at 02/14/20 1056  .  rosuvastatin (CRESTOR) tablet 40 mg  40 mg Oral Daily [name], MD   40 mg at 02/14/20 1056  . sodium chloride flush (NS) 0.9 % injection 10-40 mL  10-40 mL Intracatheter Q12H Charlynne Cousins, MD   10 mL at 02/14/20 1057  . sodium chloride flush (NS) 0.9 % injection 10-40 mL  10-40 mL Intracatheter PRN Charlynne Cousins, MD      . thiamine tablet 100 mg  100 mg Oral Daily Charlynne Cousins, MD   100 mg at 02/14/20 1056   Or  . thiamine (B-1) injection 100 mg  100 mg Intravenous Daily Charlynne Cousins, MD   100 mg at 02/13/20 0945  . umeclidinium bromide (INCRUSE ELLIPTA) 62.5 MCG/INH 1 puff  1 puff Inhalation Daily [name], MD   1 puff at 02/14/20 8325     Discharge Medications: Please see discharge summary for a list of discharge medications.  Relevant Imaging Results:  Relevant Lab Results:   Additional Information [id number]  Trula Ore, LCSWA

## 2020-02-14 NOTE — Progress Notes (Signed)
Modified Barium Swallow Progress Note  Patient Details  Name: Adam [name] MRN: [id number] Date of Birth: 11-16-1950  Today's Date: 02/14/2020  Modified Barium Swallow completed.  Full report located under Chart Review in the Imaging Section.  Brief recommendations include the following:  Clinical Impression  Pt presents with mild pharyngeal dysphagia characterized by a pharyngeal delay which resulted in penetration (PAS 3) of thin liquids via straw when consecutive swallows of used. Aspiration was not observed during the study but aspiration of penetrated material is likely. A regular texture diet with thin liquids is recommended at this time with observance of swallowing precautions. SLP will follow for dysphagia treatment.    Swallow Evaluation Recommendations       SLP Diet Recommendations: Thin liquid;Regular solids       Medication Administration: Whole meds with puree   Supervision: Staff to assist with self feeding   Compensations: Slow rate;Small sips/bites       Oral Care Recommendations: Oral care BID       Taliah Porche I. Hardin Negus, [place], [place] Office number [phone] Pager [phone]  Horton Marshall 02/14/2020,1:17 PM

## 2020-02-15 ENCOUNTER — Telehealth: Payer: Self-pay | Admitting: Family Medicine

## 2020-02-15 LAB — BASIC METABOLIC PANEL
Anion gap: 11 (ref 5–15)
BUN: 9 mg/dL (ref 8–23)
CO2: 25 mmol/L (ref 22–32)
Calcium: 8.9 mg/dL (ref 8.9–10.3)
Chloride: 101 mmol/L (ref 98–111)
Creatinine, Ser: 0.67 mg/dL (ref 0.61–1.24)
GFR calc Af Amer: >60 mL/min (ref >60)
GFR calc non Af Amer: >60 mL/min (ref >60)
Glucose, Bld: 179 mg/dL — ABNORMAL HIGH (ref 70–99)
Potassium: 3.8 mmol/L (ref 3.5–5.1)
Sodium: 137 mmol/L (ref 135–145)

## 2020-02-15 LAB — GLUCOSE, CAPILLARY
Glucose-Capillary: 143 mg/dL — ABNORMAL HIGH (ref 70–99)
Glucose-Capillary: 182 mg/dL — ABNORMAL HIGH (ref 70–99)
Glucose-Capillary: 196 mg/dL — ABNORMAL HIGH (ref 70–99)
Glucose-Capillary: 174 mg/dL — ABNORMAL HIGH (ref 70–99)

## 2020-02-15 LAB — MAGNESIUM: Magnesium: 1.9 mg/dL (ref 1.7–2.4)

## 2020-02-15 MED ORDER — MAGNESIUM SULFATE 4 GM/100ML IV SOLN
4.0000 g | Freq: Once | INTRAVENOUS | Status: AC
  Administered 2020-02-15: 4 g via INTRAVENOUS
  Filled 2020-02-15: qty 100

## 2020-02-15 MED ORDER — POTASSIUM CHLORIDE CRYS ER 20 MEQ PO TBCR
40.0000 meq | EXTENDED_RELEASE_TABLET | Freq: Once | ORAL | Status: AC
  Administered 2020-02-15: 40 meq via ORAL
  Filled 2020-02-15: qty 2

## 2020-02-15 NOTE — Chronic Care Management (AMB) (Signed)
  Chronic Care Management   Outreach Note  02/15/2020 Name: Adam [name] MRN: [id number] DOB: 04-04-1951  Referred by: [name], MD Reason for referral : Chronic Care Management   An unsuccessful telephone outreach was attempted today. The patient was referred to the pharmacist for assistance with care management and care coordination.   Follow Up Plan:   [place]

## 2020-02-15 NOTE — Progress Notes (Signed)
Pharmacy Electrolyte Replacement for Tikosyn  Recent Labs:  Recent Labs    02/15/20 0302  K 3.8  MG 1.9  CREATININE 0.67    Goal K >4, Mg > 2  Assessment:  Scheduled electrolytes: PO KCl 22mEq BID  Patient received IV Mg 2g on 5/8, 5/10, 5/11, 5/13 and Mg level continues to be less than 2. Will give higher dose today.   Plan: Will give additional PO KCl 40 mEq x1 and Mg IV 4g x1  Benetta Spar, PharmD, [place], BCCP Clinical Pharmacist  Please check AMION for all [place] phone numbers After 10:00 PM, call [place] [phone]

## 2020-02-15 NOTE — Progress Notes (Signed)
Physical Therapy Treatment Patient Details Name: Adam [name] MRN: [id number] DOB: 1951-06-06 Today's Date: 02/15/2020    History of Present Illness Pt is a 69 y.o. male with history of diabetes mellitus type 2, CAD status post CABG, A. fib not on anticoagulation probably from risk of falls with history of hypertension alcohol abuse presents to the ER the patient had a fall at home. Pt with frequent falls recently and drinking every day. Pt in afib with RVR and DKA in ED. CIWA protocol initiated.    PT Comments    Pt admitted with above diagnosis. Pt was able to pivot to 3n1 and then to chair with min to mod assist. Pt fatigues quickly. Pt has improved from last treatment however.  Will follow.  Pt currently with functional limitations due to the deficits listed below (see PT Problem List). Pt will benefit from skilled PT to increase their independence and safety with mobility to allow discharge to the venue listed below.     Follow Up Recommendations  SNF;Supervision/Assistance - 24 hour     Equipment Recommendations  Other (comment)(TBA next venue)    Recommendations for Other Services       Precautions / Restrictions Precautions Precautions: Fall Precaution Comments: CIWA Restrictions Weight Bearing Restrictions: No    Mobility  Bed Mobility Overal bed mobility: Needs Assistance Bed Mobility: Rolling;Sidelying to Sit;Sit to Supine Rolling: Mod assist Sidelying to sit: +2 for physical assistance;Mod assist       General bed mobility comments: cuing for direction, truncal assist, help with sequencing. Bed pad used to assist with hips  Transfers Overall transfer level: Needs assistance Equipment used: Rolling walker (2 wheeled) Transfers: Sit to/from Stand Sit to Stand: Min assist;+2 safety/equipment;+2 physical assistance;Mod assist;From elevated surface Stand pivot transfers: Min assist;+2 safety/equipment       General transfer comment: Pt needed min assist to  power up to RW. Noted BM on pad therefore obtained 3N1 and pt transfer to 3N1 with flexed posture and cues.  Pt stood to be cleaned and PT cleaned pt noting red on buttocks and notified nursing and put cream on buttocks. Brought recliner to pt as pt having difficulty taking steps to chair.  Pt with trunk flexion and difficulty staying inside RW for support.    Ambulation/Gait                 Stairs             Wheelchair Mobility    Modified Rankin (Stroke Patients Only)       Balance Overall balance assessment: Needs assistance Sitting-balance support: Feet supported;Single extremity supported Sitting balance-Leahy Scale: Fair Sitting balance - Comments: sat EOB with UEs upport   Standing balance support: Bilateral upper extremity supported;During functional activity Standing balance-Leahy Scale: Poor Standing balance comment: VSS                            Cognition Arousal/Alertness: Awake/alert Behavior During Therapy: Flat affect Overall Cognitive Status: Impaired/Different from baseline Area of Impairment: Orientation;Attention;Memory;Following commands;Safety/judgement;Awareness;Problem solving                 Orientation Level: Situation;Time;Place Current Attention Level: Sustained Memory: Decreased short-term memory Following Commands: Follows one step commands with increased time Safety/Judgement: Decreased awareness of deficits;Decreased awareness of safety Awareness: Intellectual Problem Solving: Slow processing;Decreased initiation;Difficulty sequencing;Requires verbal cues        Exercises Other Exercises Other Exercises: warm up hip/knee flex/ext ROM  with graded resistance    General Comments        Pertinent Vitals/Pain Pain Assessment: No/denies pain    Home Living                      Prior Function            PT Goals (current goals can now be found in the care plan section) Acute Rehab PT  Goals Patient Stated Goal: not stated Progress towards PT goals: Progressing toward goals    Frequency    Min 2X/week      PT Plan Frequency needs to be updated    Co-evaluation              AM-PAC PT "6 Clicks" Mobility   Outcome Measure  Help needed turning from your back to your side while in a flat bed without using bedrails?: A Lot Help needed moving from lying on your back to sitting on the side of a flat bed without using bedrails?: A Lot Help needed moving to and from a bed to a chair (including a wheelchair)?: A Lot Help needed standing up from a chair using your arms (e.g., wheelchair or bedside chair)?: A Lot Help needed to walk in hospital room?: Total Help needed climbing 3-5 steps with a railing? : Total 6 Click Score: 10    End of Session Equipment Utilized During Treatment: Gait belt Activity Tolerance: Patient limited by fatigue Patient left: with call bell/phone within reach;in chair;with chair alarm set Nurse Communication: Mobility status PT Visit Diagnosis: Other abnormalities of gait and mobility (R26.89);Difficulty in walking, not elsewhere classified (R26.2)     Time: [id number] PT Time Calculation (min) (ACUTE ONLY): 23 min  Charges:  $Therapeutic Activity: 23-37 mins                     Elchonon Maxson W,PT Acute Rehabilitation Services Pager:  [phone]  Office:  [place] 02/15/2020, 4:22 PM

## 2020-02-15 NOTE — Plan of Care (Signed)
  Problem: Activity: Goal: Risk for activity intolerance will decrease Outcome: Progressing  Patient up to chair, also able to turn in bed with assist from staff. Problem: Nutrition: Goal: Adequate nutrition will be maintained Outcome: Progressing  Patient increasing intakes with frequent snacks and tray set up from staff.  Problem: Education: Goal: Knowledge of General Education information will improve Description: Including pain rating scale, medication(s)/side effects and non-pharmacologic comfort measures Outcome: Not Progressing   Problem: Health Behavior/Discharge Planning: Goal: Ability to manage health-related needs will improve Outcome: Not Progressing  Patient states he is in a hospital in [place], states he does not remember why he is in the hospital and needs set up and cueing for most adl's.

## 2020-02-15 NOTE — Progress Notes (Signed)
[facility] PROGRESS NOTE    Progress Note  Adam [name]  [id number] DOB: 01/22/1951 DOA: 02/08/2020 PCP: [name], MD   Brief Narrative:   Adam [name] is an 69 y.o. male past medical history significant for diabetes mellitus type 2 CAD status post CABG, atrial fibrillation not on anticoagulation, essential hypertension heavy alcohol abuse comes into the ED for frequent falls, and confusion, in the ED he was found to be in A. 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 starvation -DKA less likely, improved -Hba1c was 6.6 -IVF stopped -Acidosis has resolved   Acute Encephalopathy  -Secondary to alcohol withdrawal and Wernicke's encephalopathy  -Slow improvement noted, scheduled Ativan discontinued due to oversedation  -Continue thiamine  -Appreciate neurology input  -MRI notes ventriculomegaly and severe atrophy, NPH felt to be less likely  -History of cognitive and memory loss for over a year according to his wife  -Ammonia level was 35 -I suspect, he has alcohol induced dementia given severity of atrophy on imaging -PT OT eval completed -Needs SNF for rehab, I suspect he will need long term supervision and assistance -Discharge planning  Alcohol withdrawals: Alcohol abuse -Continue thiamine,  CIWA protocol, discontinued scheduled Ativan due to lethargy  Atrial fibrillation with RVR ([place]): -Known history of A. fib, followed by EP Dr. Rayann Heman, on Tikosyn and metoprolol at baseline -EP note from 11/30/2019 reviewed, patient declined anticoagulation and said he would never go back on the blood thinner and would take his chances despite stroke risk -Required IV Cardizem drip this admission -Discontinued IV Cardizem, continue oral metoprolol now and Tikosyn -Discussed  anticoagulation again with patient, he adamantly declines this -Poor long-term anticoagulation candidate given chronic alcoholism as well  DM 2 -CBGs stable, continue Lantus and sliding scale -Hemoglobin A1c 6.6  Hypokalemia Hypomagnesemia -Replaced  CAD (coronary artery disease): Continue metoprolol and aspirin  COPD: Noted.   Small laceration on the right side of the forehead: No active bleeding CT of the head unremarkable.  Essential hypertension: Continue Cozaar and metoprolol.  Protein calorie malnutrition: Continue Ensure 3 times daily  Code status: DNR, discussed with wife 5/12 DVT prophylaxis: lovenox Family Communication:no family at bedside, called and updated spouse 5/12 Status is: Inpatient  Remains inpatient appropriate because: Encephalopathy, A. fib RVR   Dispo: The patient is from: Home              Anticipated d/c is to: SNF              Anticipated d/c date is: tomorrow              Patient currently is not medically stable to d/c.   Procedures and diagnostic studies:   DG Swallowing Func-Speech Pathology  Result Date: 02/14/2020 Objective Swallowing Evaluation: Type of Study: MBS-Modified Barium Swallow Study  Patient Details Name: Adam [name] MRN: [id number] Date of Birth: 02-08-1951 Today's Date: 02/14/2020 Time: SLP Start Time (ACUTE ONLY): 0855 -SLP Stop Time (ACUTE ONLY): 0910 SLP Time Calculation (min) (ACUTE ONLY): 15 min Past Medical History: Past Medical History: Diagnosis Date . Allergy  . Anxiety   history of PTSD following CABG . Ascending aortic aneurysm ([place]) 01/31/2018  43 x 42 mm, pt unaware . Asthma  . Cardiomegaly 10/17/2017 . Colitis- colonoscopy 2014 07/13/2015 . COPD (chronic obstructive pulmonary disease) ([place])  . Coronary artery disease  x 6 . Depression  . Diabetes mellitus without complication ([place])  . Family history of polyps in the colon  . Finger dislocation   Left pinkie . GERD (gastroesophageal reflux disease)  . Gout  .  H/O atrial fibrillation without current medication   following CABG with no documented episodes since then. . Heart palpitations  . Hx of adenomatous colonic polyps 08/12/2010 . Hyperlipidemia  . Hypertension  . OA (osteoarthritis)  . OSA (obstructive sleep apnea)   Mild, has not received CPAP yet . Prediabetes  . RLS (restless legs syndrome)  . 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 ANKLE FRACTURE SURGERY Right 1991 . APPENDECTOMY   . ATRIAL FIBRILLATION ABLATION N/A 03/31/2018  Procedure: ATRIAL FIBRILLATION ABLATION;  Surgeon: [name], MD;  Location: [facility];  Service: Cardiovascular;  Laterality: N/A; . CARDIAC ELECTROPHYSIOLOGY MAPPING AND ABLATION   . COLONOSCOPY W/ BIOPSIES  2017  x7 . CORONARY ANGIOPLASTY WITH STENT PLACEMENT   . CORONARY ARTERY BYPASS GRAFT   . FINGER SURGERY  04/2018  Small finger left hand . implantable loop recorder placement  07/02/2019  [employer]onic Reveal [place] model [id number] ([id number] [id number]) implanted in office by Dr Rayann Heman . TEE WITHOUT CARDIOVERSION N/A 03/30/2018  Procedure: TRANSESOPHAGEAL ECHOCARDIOGRAM (TEE);  Surgeon: [name], MD;  Location: [facility];  Service: Cardiovascular;  Laterality: N/A; . TOTAL HIP ARTHROPLASTY Left  . TOTAL HIP ARTHROPLASTY Right 09/12/2018  Procedure: TOTAL HIP ARTHROPLASTY ANTERIOR APPROACH;  Surgeon: [name], MD;  Location: [facility];  Service: Orthopedics;  Laterality: Right;  70 mins HPI: Adam [name] is a 69 y.o. male with history of diabetes mellitus type 2, CAD status post CABG, A. fib not on anticoagulation probably from risk of falls with history of hypertension alcohol abuse presents to the ER the patient had a fall at home.  MRI was negative for acute intracranial abnormality.  Subjective: Pt was alert and cooperative Assessment / Plan / Recommendation CHL IP CLINICAL IMPRESSIONS 02/14/2020 Clinical Impression Pt presents with mild  pharyngeal dysphagia characterized by a pharyngeal delay which resulted in penetration (PAS 3) of thin liquids via straw when consecutive swallows of used. Aspiration was not observed during the study but aspiration of penetrated material is likely. A regular texture diet with thin liquids is recommended at this time with observance of swallowing precautions. SLP will follow for dysphagia treatment.  SLP Visit Diagnosis Dysphagia, unspecified (R13.10) Attention and concentration deficit following -- Frontal lobe and executive function deficit following -- Impact on safety and function --   CHL IP TREATMENT RECOMMENDATION 02/14/2020 Treatment Recommendations Therapy as outlined in treatment plan below   Prognosis 02/14/2020 Prognosis for Safe Diet Advancement Good Barriers to Reach Goals -- Barriers/Prognosis Comment -- CHL IP DIET RECOMMENDATION 02/14/2020 SLP Diet Recommendations Thin liquid;Regular solids Liquid Administration via -- Medication Administration Whole meds with puree Compensations Slow rate;Small sips/bites Postural Changes --   CHL IP OTHER RECOMMENDATIONS 02/14/2020 Recommended Consults -- Oral Care Recommendations Oral care BID Other Recommendations --   CHL IP FOLLOW UP RECOMMENDATIONS 02/14/2020 Follow up Recommendations None   CHL IP FREQUENCY AND DURATION 02/14/2020 Speech Therapy Frequency (ACUTE ONLY) min 2x/week Treatment Duration 2 weeks      CHL IP ORAL PHASE 02/14/2020 Oral Phase WFL Oral - Pudding Teaspoon -- Oral - Pudding Cup -- Oral - Honey Teaspoon -- Oral - Honey Cup -- Oral - Nectar Teaspoon -- Oral - Nectar Cup --  Oral - Nectar Straw -- Oral - Thin Teaspoon -- Oral - Thin Cup -- Oral - Thin Straw -- Oral - Puree -- Oral - Mech Soft -- Oral - Regular -- Oral - Multi-Consistency -- Oral - Pill -- Oral Phase - Comment --  CHL IP PHARYNGEAL PHASE 02/14/2020 Pharyngeal Phase -- Pharyngeal- Pudding Teaspoon -- Pharyngeal -- Pharyngeal- Pudding Cup -- Pharyngeal -- Pharyngeal- Honey Teaspoon --  Pharyngeal -- Pharyngeal- Honey Cup -- Pharyngeal -- Pharyngeal- Nectar Teaspoon -- Pharyngeal -- Pharyngeal- Nectar Cup -- Pharyngeal -- Pharyngeal- Nectar Straw -- Pharyngeal -- Pharyngeal- Thin Teaspoon -- Pharyngeal -- Pharyngeal- Thin Cup -- Pharyngeal -- Pharyngeal- Thin Straw -- Pharyngeal Material enters airway, remains ABOVE vocal cords and not ejected out Pharyngeal- Puree -- Pharyngeal -- Pharyngeal- Mechanical Soft -- Pharyngeal -- Pharyngeal- Regular -- Pharyngeal -- Pharyngeal- Multi-consistency -- Pharyngeal -- Pharyngeal- Pill -- Pharyngeal -- Pharyngeal Comment --  CHL IP CERVICAL ESOPHAGEAL PHASE 02/14/2020 Cervical Esophageal Phase WFL Pudding Teaspoon -- Pudding Cup -- Honey Teaspoon -- Honey Cup -- Nectar Teaspoon -- Nectar Cup -- Nectar Straw -- Thin Teaspoon -- Thin Cup -- Thin Straw -- Puree -- Mechanical Soft -- Regular -- Multi-consistency -- Pill -- Cervical Esophageal Comment -- Shanika I. Hardin Negus, [place], [place] Office number [phone] Pager [phone] H[place] Marshall 02/14/2020, 2:02 PM                Medical Consultants:    Subjective:  -HR improved and stable today, slow improvement in mentation  Objective:    Vitals:   02/15/20 0408 02/15/20 0500 02/15/20 0756 02/15/20 1030  BP: 132/86   (!) 142/90  Pulse: 74   66  Resp: 18   20  Temp: 98.7 F (37.1 C)   98.4 F (36.9 C)  TempSrc: Oral   Oral  SpO2: 97%  95% 94%  Weight:  110.2 kg    Height:       SpO2: 94 % O2 Flow Rate (L/min): 2 L/min   Intake/Output Summary (Last 24 hours) at 02/15/2020 1129 Last data filed at 02/15/2020 0000 Gross per 24 hour  Intake 433.32 ml  Output --  Net 433.32 ml   Filed Weights   02/13/20 0449 02/14/20 0349 02/15/20 0500  Weight: 112 kg 112.9 kg 110.2 kg    Exam Averagely built male laying in bed, more responsive and interactive today, oriented to self and partly to place HEENT, pupils are equal and reactive CVS: S1-S2  irregularly irregular Lungs with diminished breath sounds at both bases Abdomen is soft, nontender, mildly distended, positive bowel sounds Extremities with no edema Neuro moves all extremities, no localizing signs, follows more commands Skin: some bruises noted Psychiatry: Flat affect Data Reviewed:    Labs: Basic Metabolic Panel: Recent Labs  Lab 02/09/20 1117 02/10/20 0436 02/11/20 0603 02/11/20 0603 02/12/20 [id number] 02/12/20 0514 02/13/20 0355 02/13/20 0355 02/14/20 0354 02/15/20 0302  NA 139   < > 140  --  142  --  139  --  138 137  K 3.9   < > 3.6   < > 4.0   < > 4.0   < > 3.9 3.8  CL 100   < > 104  --  106  --  101  --  104 101  CO2 22   < > 24  --  25  --  25  --  25 25  GLUCOSE 174*   < > 171*  --  229*  --  246*  --  166* 179*  BUN 11   < > 9  --  10  --  12  --  10 9  CREATININE 0.94   < > 0.73  --  0.77  --  0.85  --  0.76 0.67  CALCIUM 9.6   < > 9.1  --  9.1  --  8.9  --  8.6* 8.9  MG 1.8   < > 1.9  --  1.9  --  2.2  --  1.8 1.9  PHOS 1.4*  --  2.3*  2.2*  --   --   --   --   --   --   --    < > = values in this interval not displayed.   GFR Estimated Creatinine Clearance: 108.3 mL/min (by C-G formula based on SCr of 0.67 mg/dL). Liver Function Tests: Recent Labs  Lab 02/08/20 2015 02/09/20 1117 02/14/20 0354  AST 39 31 55*  ALT 29 25 55*  ALKPHOS 80 63 63  BILITOT 1.7* 1.6* 1.8*  PROT 8.2* 6.0* 6.1*  ALBUMIN 4.1 3.3* 2.6*   Recent Labs  Lab 02/08/20 2015  LIPASE 61*   Recent Labs  Lab 02/12/20 1137  AMMONIA 46*   Coagulation profile Recent Labs  Lab 02/11/20 0603  INR 1.0   COVID-19 Labs  No results for input(s): DDIMER, FERRITIN, LDH, CRP in the last 72 hours.  Lab Results  Component Value Date   SARSCOV2NAA NEGATIVE 02/08/2020   [place] NEGATIVE 10/24/2019    CBC: Recent Labs  Lab 02/08/20 2015 02/08/20 2015 02/08/20 2043 02/08/20 2244 02/09/20 1117 02/14/20 0354  WBC 11.7*  --   --  13.4* 7.8 7.1  NEUTROABS 9.2*   --   --   --   --   --   HGB 15.1  --  15.6 12.6* 12.9* 10.0*  HCT 46.5   < > 46.0 38.8* 40.0  38.5 31.6*  MCV 93.0  --   --  93.7 92.8 96.9  PLT 296  --   --  246 232 189   < > = values in this interval not displayed.   Cardiac Enzymes: Recent Labs  Lab 02/11/20 0603  CKTOTAL 47*   BNP (last 3 results) Recent Labs    12/03/19 1502  PROBNP 266.0*   CBG: Recent Labs  Lab 02/14/20 0650 02/14/20 1106 02/14/20 1617 02/14/20 2144 02/15/20 0620  GLUCAP 149* 191* 159* 130* 143*   D-Dimer: No results for input(s): DDIMER in the last 72 hours. Hgb A1c: No results for input(s): HGBA1C in the last 72 hours. Lipid Profile: No results for input(s): CHOL, HDL, LDLCALC, TRIG, CHOLHDL, LDLDIRECT in the last 72 hours. Thyroid function studies: No results for input(s): TSH, T4TOTAL, T3FREE, THYROIDAB in the last 72 hours.  Invalid input(s): FREET3 Anemia work up: No results for input(s): VITAMINB12, FOLATE, FERRITIN, TIBC, IRON, RETICCTPCT in the last 72 hours. Sepsis Labs: Recent Labs  Lab 02/08/20 2015 02/08/20 2222 02/08/20 2244 02/09/20 0802 02/09/20 1117 02/14/20 0354  WBC 11.7*  --  13.4*  --  7.8 7.1  LATICACIDVEN  --  2.3*  --  1.3 1.9  --    Microbiology Recent Results (from the past 240 hour(s))  Respiratory Panel by RT PCR (Flu A&B, Covid) - Nasopharyngeal Swab     Status: None   Collection Time: 02/08/20 10:26 PM   Specimen: Nasopharyngeal Swab  Result Value Ref Range Status   SARS Coronavirus 2 by RT  PCR NEGATIVE NEGATIVE Final    Comment: (NOTE) SARS-CoV-2 target nucleic acids are NOT DETECTED. The SARS-CoV-2 RNA is generally detectable in upper respiratoy specimens during the acute phase of infection. The lowest concentration of SARS-CoV-2 viral copies this assay can detect is 131 copies/mL. A negative result does not preclude SARS-Cov-2 infection and should not be used as the sole basis for treatment or other patient management decisions. A negative  result may occur with  improper specimen collection/handling, submission of specimen other than nasopharyngeal swab, presence of viral mutation(s) within the areas targeted by this assay, and inadequate number of viral copies (<131 copies/mL). A negative result must be combined with clinical observations, patient history, and epidemiological information. The expected result is Negative. Fact Sheet for Patients:  PinkCheek.be Fact Sheet for Healthcare Providers:  GravelBags.it This test is not yet ap proved or cleared by the [place] FDA and  has been authorized for detection and/or diagnosis of SARS-CoV-2 by FDA under an Emergency Use Authorization (EUA). This EUA will remain  in effect (meaning this test can be used) for the duration of the COVID-19 declaration under Section 564(b)(1) of the Act, 21 U.S.C. section 360bbb-3(b)(1), unless the authorization is terminated or revoked sooner.    Influenza A by PCR NEGATIVE NEGATIVE Final   Influenza B by PCR NEGATIVE NEGATIVE Final    Comment: (NOTE) The Xpert Xpress SARS-CoV-2/FLU/RSV assay is intended as an aid in  the diagnosis of influenza from Nasopharyngeal swab specimens and  should not be used as a sole basis for treatment. Nasal washings and  aspirates are unacceptable for Xpert Xpress SARS-CoV-2/FLU/RSV  testing. Fact Sheet for Patients: PinkCheek.be Fact Sheet for Healthcare Providers: GravelBags.it This test is not yet approved or cleared by the [place] FDA and  has been authorized for detection and/or diagnosis of SARS-CoV-2 by  FDA under an Emergency Use Authorization (EUA). This EUA will remain  in effect (meaning this test can be used) for the duration of the  Covid-19 declaration under Section 564(b)(1) of the Act, 21  U.S.C. section 360bbb-3(b)(1), unless the authorization is  terminated or  revoked. Performed at [facility], [place] [address], [place],  [address]      Medications:   . allopurinol  300 mg Oral Daily  . aspirin  81 mg Oral Daily  . dofetilide  125 mcg Oral BID  . DULoxetine  60 mg Oral Daily  . enoxaparin (LOVENOX) injection  40 mg Subcutaneous Q24H  . feeding supplement (ENSURE ENLIVE)  237 mL Oral TID BM  . fenofibrate  160 mg Oral Daily  . fluticasone furoate-vilanterol  1 puff Inhalation Daily  . folic acid  1 mg Oral Daily  . icosapent Ethyl  2 g Oral BID  . insulin aspart  0-9 Units Subcutaneous TID WC  . insulin detemir  8 Units Subcutaneous BID  . metoprolol tartrate  100 mg Oral BID  . multivitamin with minerals  1 tablet Oral Daily  . pantoprazole  40 mg Oral BID  . potassium chloride  20 mEq Oral BID  . potassium chloride  40 mEq Oral Once  . rosuvastatin  40 mg Oral Daily  . sodium chloride flush  10-40 mL Intracatheter Q12H  . thiamine  100 mg Oral Daily   Or  . thiamine  100 mg Intravenous Daily  . umeclidinium bromide  1 puff Inhalation Daily   Continuous Infusions: . sodium chloride 250 mL (02/14/20 1321)  . magnesium sulfate bolus IVPB  Time spent: 25 minutes   LOS: 7 days   [facility]  02/15/2020, 11:29 AM

## 2020-02-15 NOTE — Progress Notes (Signed)
Upon EKG pts QTC is 515  MD notified  Verbal order to hold tikosyn tonight and reassess with EKG in am  Will continue to monitor

## 2020-02-16 LAB — GLUCOSE, CAPILLARY
Glucose-Capillary: 180 mg/dL — ABNORMAL HIGH (ref 70–99)
Glucose-Capillary: 229 mg/dL — ABNORMAL HIGH (ref 70–99)
Glucose-Capillary: 227 mg/dL — ABNORMAL HIGH (ref 70–99)
Glucose-Capillary: 244 mg/dL — ABNORMAL HIGH (ref 70–99)

## 2020-02-16 LAB — CBC
HCT: 36.8 % — ABNORMAL LOW (ref 39.0–52.0)
Hemoglobin: 11.7 g/dL — ABNORMAL LOW (ref 13.0–17.0)
MCH: 30.7 pg (ref 26.0–34.0)
MCHC: 31.8 g/dL (ref 30.0–36.0)
MCV: 96.6 fL (ref 80.0–100.0)
Platelets: 176 10*3/uL (ref 150–400)
RBC: 3.81 MIL/uL — ABNORMAL LOW (ref 4.22–5.81)
RDW: 15.7 % — ABNORMAL HIGH (ref 11.5–15.5)
WBC: 5.4 10*3/uL (ref 4.0–10.5)
nRBC: 0 % (ref 0.0–0.2)

## 2020-02-16 LAB — BASIC METABOLIC PANEL
Anion gap: 5 (ref 5–15)
BUN: 7 mg/dL — ABNORMAL LOW (ref 8–23)
CO2: 26 mmol/L (ref 22–32)
Calcium: 9 mg/dL (ref 8.9–10.3)
Chloride: 103 mmol/L (ref 98–111)
Creatinine, Ser: 0.77 mg/dL (ref 0.61–1.24)
GFR calc Af Amer: >60 mL/min (ref >60)
GFR calc non Af Amer: >60 mL/min (ref >60)
Glucose, Bld: 189 mg/dL — ABNORMAL HIGH (ref 70–99)
Potassium: 4.5 mmol/L (ref 3.5–5.1)
Sodium: 134 mmol/L — ABNORMAL LOW (ref 135–145)

## 2020-02-16 LAB — SARS CORONAVIRUS 2 (TAT 6-24 HRS): SARS Coronavirus 2: NEGATIVE

## 2020-02-16 LAB — MAGNESIUM: Magnesium: 2.2 mg/dL (ref 1.7–2.4)

## 2020-02-16 MED ORDER — ASPIRIN 81 MG PO CHEW: 81.0000 mg | CHEWABLE_TABLET | Freq: Every day | ORAL | Status: DC

## 2020-02-16 MED ORDER — POTASSIUM CHLORIDE CRYS ER 20 MEQ PO TBCR: 20.0000 meq | EXTENDED_RELEASE_TABLET | ORAL | 0 refills | Status: DC

## 2020-02-16 MED ORDER — TOUJEO SOLOSTAR 300 UNIT/ML ~~LOC~~ SOPN: PEN_INJECTOR | SUBCUTANEOUS | Status: DC

## 2020-02-16 MED ORDER — GABAPENTIN 300 MG PO CAPS
300.0000 mg | ORAL_CAPSULE | Freq: Every day | ORAL | 0 refills | Status: DC
Start: 2020-02-16 — End: 2020-09-23

## 2020-02-16 NOTE — Plan of Care (Signed)
  Problem: Education: Goal: Knowledge of General Education information will improve Description: Including pain rating scale, medication(s)/side effects and non-pharmacologic comfort measures Outcome: Not Progressing   Problem: Health Behavior/Discharge Planning: Goal: Ability to manage health-related needs will improve Outcome: Not Progressing   Problem: Nutrition: Goal: Adequate nutrition will be maintained Outcome: Not Progressing  Patient requiring cueing, set up and encouragement to eat, often states he is not hungry and does not want to eat. Oriented to person and to some aspects of situation.

## 2020-02-16 NOTE — TOC Progression Note (Signed)
Transition of Care [facility]) - Progression Note    Patient Details  Name: Adam [name] MRN: [id number] Date of Birth: December 07, 1950  Transition of Care [facility]) CM/SW [place], [place] Phone Number: [phone] 02/16/2020, 3:39 PM  Clinical Narrative:     CSW reached out to patient's wife and provided bed offers. She stated that she wanted to research the 3 bed offers and give CSW a call back tomorrow. PASRR is still pending and insurance authorization is needed.  TOC team will continue to follow for discharge planning needs.   Expected Discharge Plan: [place] Barriers to Discharge: Continued Medical Work up  Expected Discharge Plan and Services Expected Discharge Plan: [place] arrangements for the past 2 months: Single Family Home Expected Discharge Date: 02/16/20                                     Social Determinants of Health (SDOH) Interventions    Readmission Risk Interventions No flowsheet data found.

## 2020-02-16 NOTE — Discharge Summary (Addendum)
Physician Discharge Summary  TREVYN CALDERON [id number] DOB: [birth year] DOA: 02/08/2020  PCP: [name], MD  Admit date: 02/08/2020 Discharge date: 02/18/2020  Time spent: 35 minutes  Recommendations for Outpatient Follow-up:  1. SNF for rehabilitation 2. Cardiology Dr. Rayann Heman in 1 month 3. Please check BMP in 1 week, to check K, goal is 4 -4.5 with Tikosyn 4. FU with Neurology in 1 month   Discharge Diagnoses:  Wernicke's encephalopathy Alcohol withdrawal Alcohol induced dementia Severe brain atrophy DO NOT RESUSCITATE  Atrial fibrillation with RVR (HCC)   CAD (coronary artery disease)   Alcohol dependence ([place]) Alcoholic ketoacidosis   Discharge Condition: Stable  Diet recommendation: Low-sodium, heart healthy  Filed Weights   02/16/20 0548 02/17/20 0453 02/18/20 0459  Weight: 112.5 kg 109.8 kg 107.5 kg    History of present illness:  DETAVION SEKEL is an 69 y.o. male past medical history significant for diabetes mellitus type 2 CAD status post CABG, atrial fibrillation not on anticoagulation, essential hypertension heavy alcohol abuse comes into the ED for frequent falls, and confusion, in the ED he was found to be in A. fib with RVR, also noted to have ketoacidosis   Hospital Course:   Acute Encephalopathy  -Secondary to alcohol withdrawal and Wernicke's encephalopathy  -Appreciate neurology input  who suspected patient symptoms were secondary to Wernicke's encephalopathy along with a component of alcohol withdrawal -Treated with high-dose thiamine and Ativan per CIWA protocol  -alcohol withdrawal as resolved at this time, mentation improved but with persistent cognitive deficits -MRI notes ventriculomegaly and severe atrophy, NPH felt to be less likely  -History of cognitive and memory loss for over a year according to his wife  -Ammonia level was 60 -I suspect, he has alcohol induced dementia given severity of atrophy on imaging -PT OT eval  completed, needs SNF for rehab, I suspect he will need long term supervision and assistance -He will be discharged to SNF for rehab, may need long-term placement -FU with Neurology in 1 month  Alcohol withdrawals: Alcohol abuse -Improved, treated with Ativan per CIWA protocol, thiamine   Alcoholic ketoacidosis, ? starvation -DKA less likely, improved -Hba1c was 6.6 -Resolved  Atrial fibrillation with RVR ([place]): -Known history of A. fib, followed by EP Dr. Rayann Heman, on Tikosyn and metoprolol at baseline -EP note from 11/30/2019 reviewed, patient declined anticoagulation and said he would never go back on the blood thinner and would take his chances despite stroke risk -Required IV Cardizem drip this admission -now off IV Cardizem, continue oral metoprolol and Tikosyn -Discussed anticoagulation again with patient, he adamantly declines this -Poor long-term anticoagulation candidate given chronic alcoholism as well  DM 2 -CBGs stable, continue Lantus and sliding scale -Hemoglobin A1c 6.6  Hypokalemia Hypomagnesemia -Replaced -Check BMP in 1 week at follow-up  CAD (coronary artery disease): Continue metoprolol and aspirin  COPD: Noted.   Small laceration on the right side of the forehead: No active bleeding CT of the head unremarkable.  Essential hypertension: Continue Cozaar and metoprolol.  Protein calorie malnutrition: Continue Ensure 3 times daily  Code status: DNR  Consultations:  Neurology  Discharge Exam: Vitals:   02/18/20 0122 02/18/20 0459  BP: (!) 163/88 (!) 168/87  Pulse: 60 73  Resp: 18 17  Temp: 98 F (36.7 C) (!) 97 F (36.1 C)  SpO2: 99% 100%    General: Awake alert, oriented to self and partly to place, cognitive and memory deficits noted Cardiovascular: S1-S2, regular rate rhythm Respiratory: Clear  Discharge  Instructions   Discharge Instructions    Diet - low sodium heart healthy   Complete by: As directed    Increase  activity slowly   Complete by: As directed      Allergies as of 02/18/2020      Reactions   Xarelto [rivaroxaban] Other (See Comments)   Patient stated he "ended up in the hospital with a rectal bleed"   Amoxicillin Rash, Other (See Comments)   * SEVERE RASH IN GROIN AREA Has patient had a PCN reaction causing immediate rash, facial/tongue/throat swelling, SOB or lightheadedness with hypotension: no Has patient had a PCN reaction causing severe rash involving mucus membranes or skin necrosis: no Has patient had a PCN reaction that required hospitalization: no Has patient had a PCN reaction occurring within the last 10 years: yes If all of the above answers are "NO", then may proceed with Cephalosporin use.   Augmentin [amoxicillin-pot Clavulanate] Rash, Other (See Comments)   * SEVERE RASH IN GROIN AREA   Azithromycin Rash, Other (See Comments)   * SEVERE RASH IN GROIN AREA   Clindamycin/lincomycin Rash   Keflex [cephalexin] Rash      Medication List    STOP taking these medications   Co Q-10 100 MG Caps   Jardiance 25 MG Tabs tablet Generic drug: empagliflozin   LORazepam 0.5 MG tablet Commonly known as: ATIVAN     TAKE these medications   allopurinol 300 MG tablet Commonly known as: ZYLOPRIM Take 1 tablet (300 mg total) by mouth daily.   aspirin 81 MG chewable tablet Chew 1 tablet (81 mg total) by mouth daily.   dofetilide 125 MCG capsule Commonly known as: TIKOSYN Take 1 capsule (125 mcg total) by mouth 2 (two) times daily. May have 90 day refills if patient prefers What changed: additional instructions   DULoxetine 60 MG capsule Commonly known as: Cymbalta Take 1 capsule (60 mg total) by mouth daily.   fenofibrate micronized 200 MG capsule Commonly known as: LOFIBRA Take 1 capsule (200 mg total) by mouth daily before breakfast.   fluticasone 50 MCG/ACT nasal spray Commonly known as: FLONASE Place 1 spray into both nostrils daily as needed for allergies or  rhinitis.   gabapentin 300 MG capsule Commonly known as: Neurontin Take 1 capsule (300 mg total) by mouth at bedtime. Take at night as needed for restless leg symptoms What changed:   how much to take  how to take this  when to take this  additional instructions   glucose blood test strip Commonly known as: OneTouch Verio Test once daily.  Dx E11.9   hydrocortisone 2.5 % rectal cream Commonly known as: ANUSOL-HC Use nightly at bedtime for 10 days What changed:   how much to take  how to take this  when to take this  reasons to take this  additional instructions   Januvia 100 MG tablet Generic drug: sitaGLIPtin TAKE 1 TABLET(100 MG) BY MOUTH DAILY What changed: See the new instructions.   losartan 100 MG tablet Commonly known as: COZAAR Take 1 tablet (100 mg total) by mouth daily.   magnesium 30 MG tablet Take 1 tablet (30 mg total) by mouth every morning.   metFORMIN 1000 MG tablet Commonly known as: GLUCOPHAGE TAKE 1 TABLET BY MOUTH TWICE DAILY WITH A MEAL   metoprolol tartrate 50 MG tablet Commonly known as: LOPRESSOR Take 1.5 tablets (75 mg total) by mouth 2 (two) times daily. What changed:   how much to take  Another medication  with the same name was removed. Continue taking this medication, and follow the directions you see here.   montelukast 10 MG tablet Commonly known as: SINGULAIR TAKE 1 TABLET BY MOUTH AT BEDTIME   potassium chloride SA 20 MEQ tablet Commonly known as: KLOR-CON Take 1-2 tablets (20-40 mEq total) by mouth daily. Take 40mg  in am and 20mg  in pm What changed:   how much to take  when to take this  additional instructions   RABEprazole 20 MG tablet Commonly known as: ACIPHEX TAKE 1 TABLET(20 MG) BY MOUTH DAILY What changed: See the new instructions.   rosuvastatin 40 MG tablet Commonly known as: CRESTOR Take 1 tablet (40 mg total) by mouth daily.   Toujeo SoloStar 300 UNIT/ML Solostar Pen Generic drug: insulin  glargine (1 Unit Dial) 10 units twice a day with meals What changed: additional instructions   Trelegy Ellipta 100-62.5-25 MCG/INH Aepb Generic drug: Fluticasone-Umeclidin-Vilant Inhale 1 puff into the lungs daily.   Vascepa 1 g capsule Generic drug: icosapent Ethyl TAKE 2 CAPSULES(2 GRAMS) BY MOUTH TWICE DAILY What changed: See the new instructions.      Allergies  Allergen Reactions  . Xarelto [Rivaroxaban] Other (See Comments)    Patient stated he "ended up in the hospital with a rectal bleed"  . Amoxicillin Rash and Other (See Comments)    * SEVERE RASH IN GROIN AREA Has patient had a PCN reaction causing immediate rash, facial/tongue/throat swelling, SOB or lightheadedness with hypotension: no Has patient had a PCN reaction causing severe rash involving mucus membranes or skin necrosis: no Has patient had a PCN reaction that required hospitalization: no Has patient had a PCN reaction occurring within the last 10 years: yes If all of the above answers are "NO", then may proceed with Cephalosporin use.   . Augmentin [Amoxicillin-Pot Clavulanate] Rash and Other (See Comments)    * SEVERE RASH IN GROIN AREA  . Azithromycin Rash and Other (See Comments)    * SEVERE RASH IN GROIN AREA  . Clindamycin/Lincomycin Rash  . Keflex [Cephalexin] Rash    Contact information for follow-up providers    Allred, Jeneen Rinks, MD. Schedule an appointment as soon as possible for a visit in 1 month(s).   Specialty: Cardiology Contact information: [place] [address] [phone]        [name], MD. Schedule an appointment as soon as possible for a visit in 1 week(s).   Specialty: Family Medicine Contact information: [place] [address] [phone]            Contact information for after-discharge care    Destination    [facility] Preferred SNF .   Service: Skilled Nursing Contact information: [place] [phone]                   The results of significant diagnostics from this hospitalization (including imaging, microbiology, ancillary and laboratory) are listed below for reference.    Significant Diagnostic Studies: CT HEAD WO CONTRAST  Result Date: 02/08/2020 CLINICAL DATA:  Post-traumatic headache. EXAM: CT HEAD WITHOUT CONTRAST TECHNIQUE: Contiguous axial images were obtained from the base of the skull through the vertex without intravenous contrast. COMPARISON:  None. FINDINGS: Brain: No evidence of acute infarction, hemorrhage, hydrocephalus, extra-axial collection or mass lesion/mass effect. Advanced atrophy and chronic microvascular ischemic changes are noted. Vascular: No hyperdense vessel or unexpected calcification. Skull: Normal. Negative for fracture or focal lesion.  Sinuses/Orbits: Mucosal thickening is noted of the left maxillary sinus. The remaining paranasal sinuses and mastoid air cells are essentially clear. Other: None. IMPRESSION: 1. No acute intracranial abnormality. 2. Advanced atrophy and chronic microvascular ischemic changes. Electronically Signed   By: Constance Holster M.D.   On: 02/08/2020 20:56   MR BRAIN WO CONTRAST  Result Date: 02/10/2020 CLINICAL DATA:  Initial evaluation for acute encephalopathy. EXAM: MRI HEAD WITHOUT CONTRAST TECHNIQUE: Multiplanar, multiecho pulse sequences of the brain and surrounding structures were obtained without intravenous contrast. COMPARISON:  Comparison made with prior head CT from 02/08/2020. FINDINGS: Brain: Diffuse prominence of the CSF containing spaces compatible with moderately advanced cerebral atrophy. Mild scattered T2/FLAIR hyperintensity within the periventricular white matter most consistent with chronic small vessel ischemic disease, minimal for age. No abnormal foci of restricted diffusion to suggest acute or subacute ischemia. Gray-white matter differentiation maintained.  No encephalomalacia to suggest chronic cortical infarction. No foci of susceptibility artifact to suggest acute or chronic intracranial hemorrhage. No mass lesion, midline shift or mass effect. Diffuse ventriculomegaly, which could be related to global parenchymal volume loss. A degree of NPH could be contributory. Periventricular FLAIR hyperintensity could in part reflect transependymal flow of CSF in that setting. No extra-axial fluid collection. Pituitary gland within normal limits. Midline structures intact. Vascular: Major intracranial vascular flow voids are maintained. Skull and upper cervical spine: Craniocervical junction within normal limits. Upper cervical spine normal. Bone marrow signal intensity within normal limits. No scalp soft tissue abnormality. Sinuses/Orbits: Globes and orbital soft tissues within normal limits. Mucosal thickening with air-fluid level present within the left maxillary sinus, consistent with acute sinusitis. Scattered mucosal thickening noted within the left ethmoidal air cells as well. No mastoid effusion. Inner ear structures grossly normal. Other: None. IMPRESSION: 1. No acute intracranial abnormality. 2. Moderately advanced cerebral and cerebellar atrophy. 3. Diffuse ventriculomegaly. While this finding is undoubtedly at least in part related to underlying global parenchymal atrophy, a degree of NPH could also be contributory in the correct clinical setting. 4. Acute left maxillary sinusitis. Electronically Signed   By: Jeannine Boga M.D.   On: 02/10/2020 01:03   DG Chest Port 1 View  Result Date: 02/08/2020 CLINICAL DATA:  Pt brought to ED by family after fall in the bathroom. Pt reports he fell around 6 or 7 or 12 today. Pt states is has been on the floor the whole time but then states his wife and neighbor got him up after the fall. Dried blood noted to R eyebrow, bruising under R eye. Pt denies blood thinners. Pt is oriented to self, place, pt states he slipped  on urine in the bathroom and hit head on commode EXAM: PORTABLE CHEST 1 VIEW COMPARISON:  12/03/2019 FINDINGS: Stable changes from prior cardiac surgery. Cardiac silhouette is normal in size. No mediastinal or hilar masses. Stable left anterior chest wall loop recorder. Clear lungs.  No pleural effusion or pneumothorax. Skeletal structures are grossly intact. IMPRESSION: No acute cardiopulmonary disease. Electronically Signed   By: Lajean Manes M.D.   On: 02/08/2020 20:58   DG Swallowing Func-Speech Pathology  Result Date: 02/14/2020 Objective Swallowing Evaluation: Type of Study: MBS-Modified Barium Swallow Study  Patient Details Name: NIR DELAGE MRN: [id number] Date of Birth: [birth year] Today's Date: 02/14/2020 Time: SLP Start Time (ACUTE ONLY): 0855 -SLP Stop Time (ACUTE ONLY): 0910 SLP Time Calculation (min) (ACUTE ONLY): 15 min Past Medical History: Past Medical History: Diagnosis Date . Allergy  . Anxiety   history of  PTSD following CABG . Ascending aortic aneurysm ([place]) 01/31/2018  43 x 42 mm, pt unaware . Asthma  . Cardiomegaly 10/17/2017 . Colitis- colonoscopy 2014 07/13/2015 . COPD (chronic obstructive pulmonary disease) ([place])  . Coronary artery disease   x 6 . Depression  . Diabetes mellitus without complication ([place])  . Family history of polyps in the colon  . Finger dislocation   Left pinkie . GERD (gastroesophageal reflux disease)  . Gout  . H/O atrial fibrillation without current medication   following CABG with no documented episodes since then. . Heart palpitations  . Hx of adenomatous colonic polyps 08/12/2010 . Hyperlipidemia  . Hypertension  . OA (osteoarthritis)  . OSA (obstructive sleep apnea)   Mild, has not received CPAP yet . Prediabetes  . RLS (restless legs syndrome)  . 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 ANKLE FRACTURE SURGERY Right 1991 . APPENDECTOMY   . ATRIAL FIBRILLATION ABLATION N/A 03/31/2018   Procedure: ATRIAL FIBRILLATION ABLATION;  Surgeon: [name], MD;  Location: [facility];  Service: Cardiovascular;  Laterality: N/A; . CARDIAC ELECTROPHYSIOLOGY MAPPING AND ABLATION   . COLONOSCOPY W/ BIOPSIES  2017  x7 . CORONARY ANGIOPLASTY WITH STENT PLACEMENT   . CORONARY ARTERY BYPASS GRAFT   . FINGER SURGERY  04/2018  Small finger left hand . implantable loop recorder placement  07/02/2019  Med[employer]onic Reveal [place] model [id number] ([id number] [id number]) implanted in office by Dr Rayann Heman . TEE WITHOUT CARDIOVERSION N/A 03/30/2018  Procedure: TRANSESOPHAGEAL ECHOCARDIOGRAM (TEE);  Surgeon: [name], MD;  Location: [facility];  Service: Cardiovascular;  Laterality: N/A; . TOTAL HIP ARTHROPLASTY Left  . TOTAL HIP ARTHROPLASTY Right 09/12/2018  Procedure: TOTAL HIP ARTHROPLASTY ANTERIOR APPROACH;  Surgeon: [name], MD;  Location: [facility];  Service: Orthopedics;  Laterality: Right;  70 mins HPI: SHIVIN MCLARTY is a 69 y.o. male with history of diabetes mellitus type 2, CAD status post CABG, A. fib not on anticoagulation probably from risk of falls with history of hypertension alcohol abuse presents to the ER the patient had a fall at home.  MRI was negative for acute intracranial abnormality.  Subjective: Pt was alert and cooperative Assessment / Plan / Recommendation CHL IP CLINICAL IMPRESSIONS 02/14/2020 Clinical Impression Pt presents with mild pharyngeal dysphagia characterized by a pharyngeal delay which resulted in penetration (PAS 3) of thin liquids via straw when consecutive swallows of used. Aspiration was not observed during the study but aspiration of penetrated material is likely. A regular texture diet with thin liquids is recommended at this time with observance of swallowing precautions. SLP will follow for dysphagia treatment.  SLP Visit Diagnosis Dysphagia, unspecified (R13.10) Attention and concentration deficit following -- Frontal lobe and executive function deficit following -- Impact  on safety and function --   CHL IP TREATMENT RECOMMENDATION 02/14/2020 Treatment Recommendations Therapy as outlined in treatment plan below   Prognosis 02/14/2020 Prognosis for Safe Diet Advancement Good Barriers to Reach Goals -- Barriers/Prognosis Comment -- CHL IP DIET RECOMMENDATION 02/14/2020 SLP Diet Recommendations Thin liquid;Regular solids Liquid Administration via -- Medication Administration Whole meds with puree Compensations Slow rate;Small sips/bites Postural Changes --   CHL IP OTHER RECOMMENDATIONS 02/14/2020 Recommended Consults -- Oral Care Recommendations Oral care BID Other Recommendations --   CHL IP FOLLOW UP RECOMMENDATIONS 02/14/2020 Follow up Recommendations None   CHL IP FREQUENCY AND DURATION 02/14/2020 Speech Therapy Frequency (ACUTE ONLY) min 2x/week Treatment Duration 2 weeks  CHL IP ORAL PHASE 02/14/2020 Oral Phase WFL Oral - Pudding Teaspoon -- Oral - Pudding Cup -- Oral - Honey Teaspoon -- Oral - Honey Cup -- Oral - Nectar Teaspoon -- Oral - Nectar Cup -- Oral - Nectar Straw -- Oral - Thin Teaspoon -- Oral - Thin Cup -- Oral - Thin Straw -- Oral - Puree -- Oral - Mech Soft -- Oral - Regular -- Oral - Multi-Consistency -- Oral - Pill -- Oral Phase - Comment --  CHL IP PHARYNGEAL PHASE 02/14/2020 Pharyngeal Phase -- Pharyngeal- Pudding Teaspoon -- Pharyngeal -- Pharyngeal- Pudding Cup -- Pharyngeal -- Pharyngeal- Honey Teaspoon -- Pharyngeal -- Pharyngeal- Honey Cup -- Pharyngeal -- Pharyngeal- Nectar Teaspoon -- Pharyngeal -- Pharyngeal- Nectar Cup -- Pharyngeal -- Pharyngeal- Nectar Straw -- Pharyngeal -- Pharyngeal- Thin Teaspoon -- Pharyngeal -- Pharyngeal- Thin Cup -- Pharyngeal -- Pharyngeal- Thin Straw -- Pharyngeal Material enters airway, remains ABOVE vocal cords and not ejected out Pharyngeal- Puree -- Pharyngeal -- Pharyngeal- Mechanical Soft -- Pharyngeal -- Pharyngeal- Regular -- Pharyngeal -- Pharyngeal- Multi-consistency -- Pharyngeal -- Pharyngeal- Pill -- Pharyngeal --  Pharyngeal Comment --  CHL IP CERVICAL ESOPHAGEAL PHASE 02/14/2020 Cervical Esophageal Phase WFL Pudding Teaspoon -- Pudding Cup -- Honey Teaspoon -- Honey Cup -- Nectar Teaspoon -- Nectar Cup -- Nectar Straw -- Thin Teaspoon -- Thin Cup -- Thin Straw -- Puree -- Mechanical Soft -- Regular -- Multi-consistency -- Pill -- Cervical Esophageal Comment -- Shanika I. Hardin Negus, [place], [place] Office number [phone] Pager [phone] Horton Marshall 02/14/2020, 2:02 PM              ECHOCARDIOGRAM COMPLETE  Result Date: 02/11/2020    ECHOCARDIOGRAM REPORT   Patient Name:   CONAR BORCHERT Date of Exam: 02/11/2020 Medical Rec #:  [id number]        Height:       70.0 in Accession #:    [id number]       Weight:       242.0 lb Date of Birth:  August 27, 1951        BSA:          2.263 m Patient Age:    59 years         BP:           121/96 mmHg Patient Gender: M                HR:           68 bpm. Exam Location:  Inpatient Procedure: 2D Echo Indications:    Abnormal ECG R94.31  History:        Patient has prior history of Echocardiogram examinations, most                 recent 03/30/2018. Prior CABG, COPD, Arrythmias:Atrial                 Fibrillation; Risk Factors:Hypertension, Dyslipidemia and                 Diabetes.  Sonographer:    Mikki Santee RDCS (AE) Referring Phys: [place]  Sonographer Comments: No subcostal window. Image acquisition challenging due to uncooperative patient. IMPRESSIONS  1. Technically difficult study with limited views and patient uncooperative. Grossly normal LV systolic function. Left ventricular ejection fraction, by estimation, is 60 to 65%. Image quality is not sufficient to assess for regional wall motiuon abnormalities. There is mild left ventricular hypertrophy. Left ventricular diastolic parameters are indeterminate.  2. Right ventricle is pooly visualized but grossly normal size with mildly reduced systolic function.  3. The mitral  valve is normal in structure. Mild mitral valve regurgitation.  4. The aortic valve was not well visualized. Aortic valve regurgitation is not visualized. No aortic stenosis is present. FINDINGS  Left Ventricle: Left ventricular ejection fraction, by estimation, is 60 to 65%. The left ventricle has normal function. The left ventricle has no regional wall motion abnormalities. The left ventricular internal cavity size was normal in size. There is  mild left ventricular hypertrophy. Left ventricular diastolic parameters are indeterminate. Right Ventricle: The right ventricular size is normal. Right vetricular wall thickness was not assessed. Right ventricular systolic function is mildly reduced. Left Atrium: Left atrial size was normal in size. Right Atrium: Right atrial size was normal in size. Pericardium: There is no evidence of pericardial effusion. Mitral Valve: The mitral valve is normal in structure. Mild mitral valve regurgitation. Tricuspid Valve: The tricuspid valve is grossly normal. Tricuspid valve regurgitation is not demonstrated. Aortic Valve: The aortic valve was not well visualized. Aortic valve regurgitation is not visualized. No aortic stenosis is present. Pulmonic Valve: The pulmonic valve was not well visualized. Pulmonic valve regurgitation is not visualized. Aorta: The aortic root is normal in size and structure. IAS/Shunts: The interatrial septum was not well visualized.  LEFT VENTRICLE PLAX 2D LVIDd:         4.70 cm  Diastology LVIDs:         3.40 cm  LV e' lateral: 7.35 cm/s LV PW:         1.10 cm  LV e' medial:  5.10 cm/s LV IVS:        1.00 cm LVOT diam:     2.20 cm LV SV:         65 LV SV Index:   29 LVOT Area:     3.80 cm  RIGHT VENTRICLE RV S prime:     6.30 cm/s TAPSE (M-mode): 1.9 cm LEFT ATRIUM             Index       RIGHT ATRIUM           Index LA diam:        4.20 cm 1.86 cm/m  RA Area:     12.90 cm LA Vol (A2C):   47.5 ml 20.99 ml/m RA Volume:   24.40 ml  10.78 ml/m LA Vol  (A4C):   33.5 ml 14.80 ml/m LA Biplane Vol: 43.1 ml 19.04 ml/m  AORTIC VALVE LVOT Vmax:   94.00 cm/s LVOT Vmean:  64.500 cm/s LVOT VTI:    0.172 m  AORTA Ao Root diam: 3.50 cm MR Peak grad: 59.3 mmHg MR Vmax:      385.00 cm/s SHUNTS                           Systemic VTI:  0.17 m                           Systemic Diam: 2.20 cm Oswaldo Milian MD Electronically signed by Oswaldo Milian MD Signature Date/Time: 02/11/2020/4:51:40 PM    Final    CUP PACEART REMOTE DEVICE CHECK  Result Date: 02/10/2020 [facility] summary report received. Battery status OK. Normal device function. No new symptom episodes, 2 tachy episodes previously reviewed/reported as AF w/ RVR w/ max VR 240bpm. No brady, or pause episodes. AF  burden 5.1% w/ longest last 90 days 2 hours. Known PAF on tikosyn and lopressor, no OAC noted. Was seen in ED 02/08/20. Monthly summary reports and ROV/PRN [facility]   Microbiology: Recent Results (from the past 240 hour(s))  Respiratory Panel by RT PCR (Flu A&B, Covid) - Nasopharyngeal Swab     Status: None   Collection Time: 02/08/20 10:26 PM   Specimen: Nasopharyngeal Swab  Result Value Ref Range Status   SARS Coronavirus 2 by RT PCR NEGATIVE NEGATIVE Final    Comment: (NOTE) SARS-CoV-2 target nucleic acids are NOT DETECTED. The SARS-CoV-2 RNA is generally detectable in upper respiratoy specimens during the acute phase of infection. The lowest concentration of SARS-CoV-2 viral copies this assay can detect is 131 copies/mL. A negative result does not preclude SARS-Cov-2 infection and should not be used as the sole basis for treatment or other patient management decisions. A negative result may occur with  improper specimen collection/handling, submission of specimen other than nasopharyngeal swab, presence of viral mutation(s) within the areas targeted by this assay, and inadequate number of viral copies (<131 copies/mL). A negative result must be combined with  clinical observations, patient history, and epidemiological information. The expected result is Negative. Fact Sheet for Patients:  PinkCheek.be Fact Sheet for Healthcare Providers:  GravelBags.it This test is not yet ap proved or cleared by the [place] FDA and  has been authorized for detection and/or diagnosis of SARS-CoV-2 by FDA under an Emergency Use Authorization (EUA). This EUA will remain  in effect (meaning this test can be used) for the duration of the COVID-19 declaration under Section 564(b)(1) of the Act, 21 U.S.C. section 360bbb-3(b)(1), unless the authorization is terminated or revoked sooner.    Influenza A by PCR NEGATIVE NEGATIVE Final   Influenza B by PCR NEGATIVE NEGATIVE Final    Comment: (NOTE) The Xpert Xpress SARS-CoV-2/FLU/RSV assay is intended as an aid in  the diagnosis of influenza from Nasopharyngeal swab specimens and  should not be used as a sole basis for treatment. Nasal washings and  aspirates are unacceptable for Xpert Xpress SARS-CoV-2/FLU/RSV  testing. Fact Sheet for Patients: PinkCheek.be Fact Sheet for Healthcare Providers: GravelBags.it This test is not yet approved or cleared by the [place] FDA and  has been authorized for detection and/or diagnosis of SARS-CoV-2 by  FDA under an Emergency Use Authorization (EUA). This EUA will remain  in effect (meaning this test can be used) for the duration of the  Covid-19 declaration under Section 564(b)(1) of the Act, 21  U.S.C. section 360bbb-3(b)(1), unless the authorization is  terminated or revoked. Performed at [facility], [place] [address], [place], [place] [address]   SARS CORONAVIRUS 2 (TAT 6-24 HRS) Nasopharyngeal Nasopharyngeal Swab     Status: None   Collection Time: 02/16/20  2:49 PM   Specimen: Nasopharyngeal Swab  Result Value Ref Range Status   SARS  Coronavirus 2 NEGATIVE NEGATIVE Final    Comment: (NOTE) SARS-CoV-2 target nucleic acids are NOT DETECTED. The SARS-CoV-2 RNA is generally detectable in upper and lower respiratory specimens during the acute phase of infection. Negative results do not preclude SARS-CoV-2 infection, do not rule out co-infections with other pathogens, and should not be used as the sole basis for treatment or other patient management decisions. Negative results must be combined with clinical observations, patient history, and epidemiological information. The expected result is Negative. Fact Sheet for Patients: SugarRoll.be Fact Sheet for Healthcare Providers: https://www.woods-mathews.com/ This test is not yet approved or  cleared by the [place] and  has been authorized for detection and/or diagnosis of SARS-CoV-2 by FDA under an Emergency Use Authorization (EUA). This EUA will remain  in effect (meaning this test can be used) for the duration of the COVID-19 declaration under Section 56 4(b)(1) of the Act, 21 U.S.C. section 360bbb-3(b)(1), unless the authorization is terminated or revoked sooner. Performed at [facility], [place] [address], [place], [place] [address]      Labs: Basic Metabolic Panel: Recent Labs  Lab 02/14/20 0354 02/15/20 0302 02/16/20 0349 02/17/20 0246 02/18/20 0502  NA 138 137 134* 134* 132*  K 3.9 3.8 4.5 4.4 4.2  CL 104 101 103 103 101  CO2 25 25 26 23 23   GLUCOSE 166* 179* 189* 232* 169*  BUN 10 9 7* 8 9  CREATININE 0.76 0.67 0.77 0.72 0.79  CALCIUM 8.6* 8.9 9.0 9.2 9.3  MG 1.8 1.9 2.2 1.9 2.0   Liver Function Tests: Recent Labs  Lab 02/14/20 0354  AST 55*  ALT 55*  ALKPHOS 63  BILITOT 1.8*  PROT 6.1*  ALBUMIN 2.6*   No results for input(s): LIPASE, AMYLASE in the last 168 hours. Recent Labs  Lab 02/12/20 1137  AMMONIA 46*   CBC: Recent Labs  Lab 02/14/20 0354 02/16/20 0349  WBC 7.1 5.4   HGB 10.0* 11.7*  HCT 31.6* 36.8*  MCV 96.9 96.6  PLT 189 176   Cardiac Enzymes: No results for input(s): CKTOTAL, CKMB, CKMBINDEX, TROPONINI in the last 168 hours. BNP: BNP (last 3 results) No results for input(s): BNP in the last 8760 hours.  ProBNP (last 3 results) Recent Labs    12/03/19 1502  PROBNP 266.0*    CBG: Recent Labs  Lab 02/17/20 0606 02/17/20 1158 02/17/20 1704 02/17/20 2132 02/18/20 0618  GLUCAP 172* 183* 154* 161* 161*       Signed:  Domenic Polite MD.  [facility] 02/18/2020, 10:07 AM

## 2020-02-17 LAB — MAGNESIUM: Magnesium: 1.9 mg/dL (ref 1.7–2.4)

## 2020-02-17 LAB — BASIC METABOLIC PANEL
Anion gap: 8 (ref 5–15)
BUN: 8 mg/dL (ref 8–23)
CO2: 23 mmol/L (ref 22–32)
Calcium: 9.2 mg/dL (ref 8.9–10.3)
Chloride: 103 mmol/L (ref 98–111)
Creatinine, Ser: 0.72 mg/dL (ref 0.61–1.24)
GFR calc Af Amer: >60 mL/min (ref >60)
GFR calc non Af Amer: >60 mL/min (ref >60)
Glucose, Bld: 232 mg/dL — ABNORMAL HIGH (ref 70–99)
Potassium: 4.4 mmol/L (ref 3.5–5.1)
Sodium: 134 mmol/L — ABNORMAL LOW (ref 135–145)

## 2020-02-17 LAB — GLUCOSE, CAPILLARY
Glucose-Capillary: 172 mg/dL — ABNORMAL HIGH (ref 70–99)
Glucose-Capillary: 183 mg/dL — ABNORMAL HIGH (ref 70–99)
Glucose-Capillary: 154 mg/dL — ABNORMAL HIGH (ref 70–99)
Glucose-Capillary: 161 mg/dL — ABNORMAL HIGH (ref 70–99)

## 2020-02-17 MED ORDER — POTASSIUM CHLORIDE CRYS ER 20 MEQ PO TBCR
20.0000 meq | EXTENDED_RELEASE_TABLET | Freq: Every day | ORAL | Status: DC
  Administered 2020-02-18: 20 meq via ORAL
  Filled 2020-02-17: qty 1

## 2020-02-17 MED ORDER — MAGNESIUM SULFATE 2 GM/50ML IV SOLN
2.0000 g | Freq: Once | INTRAVENOUS | Status: AC
  Administered 2020-02-17: 2 g via INTRAVENOUS

## 2020-02-17 MED ORDER — METOPROLOL TARTRATE 50 MG PO TABS: 75.0000 mg | ORAL_TABLET | Freq: Two times a day (BID) | ORAL | Status: DC

## 2020-02-17 MED ORDER — METOPROLOL TARTRATE 50 MG PO TABS
75.0000 mg | ORAL_TABLET | Freq: Two times a day (BID) | ORAL | Status: DC
  Administered 2020-02-17 – 2020-02-18 (×2): 75 mg via ORAL
  Filled 2020-02-17 (×2): qty 1

## 2020-02-17 NOTE — Progress Notes (Signed)
Per CCMD, patient heart rate dropped to 40's again, non-sustianed. Heart rate currently at 71. Patient currently lying in bed sleeping, family at bedside. Broadus John, MD paged; per verbal orders from MD, as long as bradycardia is non-sustained, no need to page MD. RN will implement orders.

## 2020-02-17 NOTE — Progress Notes (Signed)
Patient QTc greater than 500 and HR staying mainly in the 50s.  Tikosyn and Lopressor held.  APP notified.

## 2020-02-17 NOTE — Progress Notes (Signed)
Per CCMD, patient heart rate dropped to 45. Patient denies any chest pain, shortness of breath or dizziness, is alert and oriented X3. Current vitals are as follows:   02/17/20 1125  Vitals  BP (!) 147/95  MAP (mmHg) 104  BP Location Right Arm  BP Method Automatic  Patient Position (if appropriate) Lying  Pulse Rate 74  Resp 16  Oxygen Therapy  SpO2 98 %  O2 Device Room Air  MEWS Score  MEWS Temp 0  MEWS Systolic 0  MEWS Pulse 0  MEWS RR 0  MEWS LOC 0  MEWS Score 0  MEWS Score Color Adam Singh, MD paged.

## 2020-02-17 NOTE — Progress Notes (Signed)
Patient seen and examined, no changes from my discharge summary yesterday, -Cognitive deficits remain -Needs SNF for rehab, CSW following  Domenic Polite, MD

## 2020-02-17 NOTE — TOC Progression Note (Signed)
Transition of Care [facility]) - Progression Note    Patient Details  Name: Adam [name] MRN: [id number] Date of Birth: [birth year]  Transition of Care [facility]) CM/SW [place], LCSW Phone Number:336 [phone] 02/17/2020, 3:44 PM  Clinical Narrative:     CSW spoke with patient's wife and she chose [facility]. CSW spoke with [place] and she stated that they could accept patient.  CSW started [facility] reference [id number]. CSW faxed clinicals as well.  TOC team will continue to monitor for discharge planning needs.    Expected Discharge Plan: [place] Barriers to Discharge: Continued Medical Work up  Expected Discharge Plan and Services Expected Discharge Plan: [place] arrangements for the past 2 months: Single Family Home Expected Discharge Date: 02/16/20                                     Social Determinants of Health (SDOH) Interventions    Readmission Risk Interventions No flowsheet data found.

## 2020-02-18 DIAGNOSIS — Z03818 Encounter for observation for suspected exposure to other biological agents ruled out: Secondary | ICD-10-CM | POA: Diagnosis not present

## 2020-02-18 DIAGNOSIS — J984 Other disorders of lung: Secondary | ICD-10-CM | POA: Diagnosis not present

## 2020-02-18 DIAGNOSIS — Z888 Allergy status to other drugs, medicaments and biological substances status: Secondary | ICD-10-CM | POA: Diagnosis not present

## 2020-02-18 DIAGNOSIS — R5381 Other malaise: Secondary | ICD-10-CM | POA: Diagnosis not present

## 2020-02-18 DIAGNOSIS — F4310 Post-traumatic stress disorder, unspecified: Secondary | ICD-10-CM | POA: Diagnosis not present

## 2020-02-18 DIAGNOSIS — Z7982 Long term (current) use of aspirin: Secondary | ICD-10-CM | POA: Diagnosis not present

## 2020-02-18 DIAGNOSIS — M1990 Unspecified osteoarthritis, unspecified site: Secondary | ICD-10-CM | POA: Diagnosis not present

## 2020-02-18 DIAGNOSIS — E1165 Type 2 diabetes mellitus with hyperglycemia: Secondary | ICD-10-CM | POA: Diagnosis not present

## 2020-02-18 DIAGNOSIS — Z7401 Bed confinement status: Secondary | ICD-10-CM | POA: Diagnosis not present

## 2020-02-18 DIAGNOSIS — L8990 Pressure ulcer of unspecified site, unspecified stage: Secondary | ICD-10-CM | POA: Diagnosis not present

## 2020-02-18 DIAGNOSIS — I951 Orthostatic hypotension: Secondary | ICD-10-CM | POA: Diagnosis not present

## 2020-02-18 DIAGNOSIS — M2550 Pain in unspecified joint: Secondary | ICD-10-CM | POA: Diagnosis not present

## 2020-02-18 DIAGNOSIS — Z87891 Personal history of nicotine dependence: Secondary | ICD-10-CM | POA: Diagnosis not present

## 2020-02-18 DIAGNOSIS — Z8249 Family history of ischemic heart disease and other diseases of the circulatory system: Secondary | ICD-10-CM | POA: Diagnosis not present

## 2020-02-18 DIAGNOSIS — Z79899 Other long term (current) drug therapy: Secondary | ICD-10-CM | POA: Diagnosis not present

## 2020-02-18 DIAGNOSIS — E1110 Type 2 diabetes mellitus with ketoacidosis without coma: Secondary | ICD-10-CM | POA: Diagnosis not present

## 2020-02-18 DIAGNOSIS — R41841 Cognitive communication deficit: Secondary | ICD-10-CM | POA: Diagnosis not present

## 2020-02-18 DIAGNOSIS — R4182 Altered mental status, unspecified: Secondary | ICD-10-CM | POA: Diagnosis not present

## 2020-02-18 DIAGNOSIS — F329 Major depressive disorder, single episode, unspecified: Secondary | ICD-10-CM | POA: Diagnosis not present

## 2020-02-18 DIAGNOSIS — Z86010 Personal history of colonic polyps: Secondary | ICD-10-CM | POA: Diagnosis not present

## 2020-02-18 DIAGNOSIS — J449 Chronic obstructive pulmonary disease, unspecified: Secondary | ICD-10-CM | POA: Diagnosis not present

## 2020-02-18 DIAGNOSIS — I712 Thoracic aortic aneurysm, without rupture: Secondary | ICD-10-CM | POA: Diagnosis not present

## 2020-02-18 DIAGNOSIS — Z951 Presence of aortocoronary bypass graft: Secondary | ICD-10-CM | POA: Diagnosis not present

## 2020-02-18 DIAGNOSIS — I499 Cardiac arrhythmia, unspecified: Secondary | ICD-10-CM | POA: Diagnosis not present

## 2020-02-18 DIAGNOSIS — J441 Chronic obstructive pulmonary disease with (acute) exacerbation: Secondary | ICD-10-CM | POA: Diagnosis not present

## 2020-02-18 DIAGNOSIS — I4891 Unspecified atrial fibrillation: Secondary | ICD-10-CM | POA: Diagnosis not present

## 2020-02-18 DIAGNOSIS — M6281 Muscle weakness (generalized): Secondary | ICD-10-CM | POA: Diagnosis not present

## 2020-02-18 DIAGNOSIS — I471 Supraventricular tachycardia: Secondary | ICD-10-CM | POA: Diagnosis not present

## 2020-02-18 DIAGNOSIS — R008 Other abnormalities of heart beat: Secondary | ICD-10-CM | POA: Diagnosis not present

## 2020-02-18 DIAGNOSIS — F419 Anxiety disorder, unspecified: Secondary | ICD-10-CM | POA: Diagnosis not present

## 2020-02-18 DIAGNOSIS — E1159 Type 2 diabetes mellitus with other circulatory complications: Secondary | ICD-10-CM | POA: Diagnosis not present

## 2020-02-18 DIAGNOSIS — R2689 Other abnormalities of gait and mobility: Secondary | ICD-10-CM | POA: Diagnosis not present

## 2020-02-18 DIAGNOSIS — I2510 Atherosclerotic heart disease of native coronary artery without angina pectoris: Secondary | ICD-10-CM | POA: Diagnosis not present

## 2020-02-18 DIAGNOSIS — R000 Tachycardia, unspecified: Secondary | ICD-10-CM | POA: Diagnosis not present

## 2020-02-18 DIAGNOSIS — I959 Hypotension, unspecified: Secondary | ICD-10-CM | POA: Diagnosis not present

## 2020-02-18 DIAGNOSIS — F1027 Alcohol dependence with alcohol-induced persisting dementia: Secondary | ICD-10-CM | POA: Diagnosis not present

## 2020-02-18 DIAGNOSIS — Z880 Allergy status to penicillin: Secondary | ICD-10-CM | POA: Diagnosis not present

## 2020-02-18 DIAGNOSIS — E785 Hyperlipidemia, unspecified: Secondary | ICD-10-CM | POA: Diagnosis not present

## 2020-02-18 DIAGNOSIS — Z20822 Contact with and (suspected) exposure to covid-19: Secondary | ICD-10-CM | POA: Diagnosis not present

## 2020-02-18 DIAGNOSIS — Z9181 History of falling: Secondary | ICD-10-CM | POA: Diagnosis not present

## 2020-02-18 DIAGNOSIS — R2681 Unsteadiness on feet: Secondary | ICD-10-CM | POA: Diagnosis not present

## 2020-02-18 DIAGNOSIS — I10 Essential (primary) hypertension: Secondary | ICD-10-CM | POA: Diagnosis not present

## 2020-02-18 DIAGNOSIS — E512 Wernicke's encephalopathy: Secondary | ICD-10-CM | POA: Diagnosis not present

## 2020-02-18 DIAGNOSIS — R1312 Dysphagia, oropharyngeal phase: Secondary | ICD-10-CM | POA: Diagnosis not present

## 2020-02-18 DIAGNOSIS — I4819 Other persistent atrial fibrillation: Secondary | ICD-10-CM | POA: Diagnosis not present

## 2020-02-18 DIAGNOSIS — K219 Gastro-esophageal reflux disease without esophagitis: Secondary | ICD-10-CM | POA: Diagnosis not present

## 2020-02-18 DIAGNOSIS — G4733 Obstructive sleep apnea (adult) (pediatric): Secondary | ICD-10-CM | POA: Diagnosis not present

## 2020-02-18 DIAGNOSIS — R0902 Hypoxemia: Secondary | ICD-10-CM | POA: Diagnosis not present

## 2020-02-18 DIAGNOSIS — W19XXXA Unspecified fall, initial encounter: Secondary | ICD-10-CM | POA: Diagnosis present

## 2020-02-18 DIAGNOSIS — I480 Paroxysmal atrial fibrillation: Secondary | ICD-10-CM | POA: Diagnosis not present

## 2020-02-18 DIAGNOSIS — Z800 Family history of malignant neoplasm of digestive organs: Secondary | ICD-10-CM | POA: Diagnosis not present

## 2020-02-18 DIAGNOSIS — E1169 Type 2 diabetes mellitus with other specified complication: Secondary | ICD-10-CM | POA: Diagnosis not present

## 2020-02-18 DIAGNOSIS — E43 Unspecified severe protein-calorie malnutrition: Secondary | ICD-10-CM | POA: Diagnosis not present

## 2020-02-18 DIAGNOSIS — Z881 Allergy status to other antibiotic agents status: Secondary | ICD-10-CM | POA: Diagnosis not present

## 2020-02-18 DIAGNOSIS — Z66 Do not resuscitate: Secondary | ICD-10-CM | POA: Diagnosis not present

## 2020-02-18 LAB — BASIC METABOLIC PANEL
Anion gap: 8 (ref 5–15)
BUN: 9 mg/dL (ref 8–23)
CO2: 23 mmol/L (ref 22–32)
Calcium: 9.3 mg/dL (ref 8.9–10.3)
Chloride: 101 mmol/L (ref 98–111)
Creatinine, Ser: 0.79 mg/dL (ref 0.61–1.24)
GFR calc Af Amer: >60 mL/min (ref >60)
GFR calc non Af Amer: >60 mL/min (ref >60)
Glucose, Bld: 169 mg/dL — ABNORMAL HIGH (ref 70–99)
Potassium: 4.2 mmol/L (ref 3.5–5.1)
Sodium: 132 mmol/L — ABNORMAL LOW (ref 135–145)

## 2020-02-18 LAB — GLUCOSE, CAPILLARY
Glucose-Capillary: 161 mg/dL — ABNORMAL HIGH (ref 70–99)
Glucose-Capillary: 167 mg/dL — ABNORMAL HIGH (ref 70–99)
Glucose-Capillary: 166 mg/dL — ABNORMAL HIGH (ref 70–99)

## 2020-02-18 LAB — MAGNESIUM: Magnesium: 2 mg/dL (ref 1.7–2.4)

## 2020-02-18 MED ORDER — POTASSIUM CHLORIDE CRYS ER 20 MEQ PO TBCR: 40.0000 meq | EXTENDED_RELEASE_TABLET | Freq: Every day | ORAL | Status: DC

## 2020-02-18 MED ORDER — MAGNESIUM 30 MG PO TABS
30.0000 mg | ORAL_TABLET | Freq: Every morning | ORAL | Status: DC
Start: 2020-02-18 — End: 2020-06-05

## 2020-02-18 MED ORDER — POTASSIUM CHLORIDE CRYS ER 20 MEQ PO TBCR: 20.0000 meq | EXTENDED_RELEASE_TABLET | Freq: Every day | ORAL | Status: DC

## 2020-02-18 NOTE — Progress Notes (Signed)
Report called to Tonette Bihari, Therapist, sports at [place].

## 2020-02-18 NOTE — Progress Notes (Signed)
Discharge education and medication education given to patient with teach back; AVS paperwork printed and given to PTAR transporters. All questions and concerns answered. Peripheral IV and telemetry leads removed. All patient belongings given to PTAR transporters. Patient transported to [facility] by [facility] transpiration.

## 2020-02-18 NOTE — Progress Notes (Signed)
Midline removed from patient's left upper arm, catheter intact upon inspection with 8 cm catheter length. Patient tolerated well with no discomfort. Pressure held for several minutes until site no longer oozing.

## 2020-02-18 NOTE — Care Management Important Message (Signed)
Important Message  Patient Details  Name: Adam [name] MRN: [id number] Date of Birth: January 18, 1951   Medicare Important Message Given:  Yes     [name] 02/18/2020, 10:37 AM

## 2020-02-18 NOTE — Progress Notes (Signed)
Patient's QTc interval is 587 confirmed by 12 lead EKG. Verbal order received from Broadus John, MD to hold morning scheduled dose of Tikosyn. Patient heart rate sustaining in 50's. Verbal orders received form Broadus John, MD to wait until 1130 to give metoprolol if heart rate sustaining above 60; patient currently in a-fib. RN will implement orders.

## 2020-02-18 NOTE — TOC Transition Note (Signed)
Transition of Care [facility]) - CM/SW Discharge Note   Patient Details  Name: Adam [name] MRN: [id number] Date of Birth: 11-03-1950  Transition of Care [facility]) CM/SW Contact:  Alberteen Sam, LCSW Phone Number: 02/18/2020, 1:32 PM   Clinical Narrative:     Patient will DC [id number] Anticipated DC date: 02/18/20 Family notified:[name] [id number]  Per MD patient ready for DC to [facility] . RN, patient, patient's family, and facility notified of DC. Discharge Summary sent to facility. RN given number for report  [phone] Room 1202 [place] packet on chart. Ambulance transport requested for patient.  CSW signing off.  [place], [place]   Final next level of care: Skilled Nursing Facility Barriers to Discharge: No Barriers Identified   Patient Goals and CMS Choice Patient states their goals for this hospitalization and ongoing recovery are:: to go to skilled nursing facility CMS Medicare.gov Compare Post Acute Care list provided to:: Patient Represenative (must comment)(spouse [name]) Choice offered to / list presented to : Spouse  Discharge Placement PASRR number recieved: 02/14/20(pending PASRR number however assessment was completed)            Patient chooses bed at: [facility] Patient to be transferred to facility by: [facility] Name of family member notified: [name] Patient and family notified of of transfer: 02/18/20  Discharge Plan and Services                                     Social Determinants of Health (SDOH) Interventions     Readmission Risk Interventions No flowsheet data found.

## 2020-02-18 NOTE — TOC Progression Note (Signed)
Transition of Care [facility]) - Progression Note    Patient Details  Name: Adam [name] MRN: [id number] Date of Birth: 1951-03-17  Transition of Care [facility]) CM/SW [place], [place] Phone Number: 02/18/2020, 8:52 AM  Clinical Narrative:     CSW spoke with Fidela [facility]is[facility], they report insurance authorization still under review, should have a determination today and they will give CSW a call.   Expected Discharge Plan: [place] Barriers to Discharge: Continued Medical Work up  Expected Discharge Plan and Services Expected Discharge Plan: [place] arrangements for the past 2 months: Single Family Home Expected Discharge Date: 02/16/20                                     Social Determinants of Health (SDOH) Interventions    Readmission Risk Interventions No flowsheet data found.

## 2020-02-20 ENCOUNTER — Other Ambulatory Visit: Payer: Self-pay

## 2020-02-20 ENCOUNTER — Emergency Department (HOSPITAL_COMMUNITY): Payer: Medicare Other

## 2020-02-20 ENCOUNTER — Inpatient Hospital Stay (HOSPITAL_COMMUNITY)
Admission: EM | Admit: 2020-02-20 | Discharge: 2020-03-03 | DRG: 310 | Disposition: A | Discharge status: Skilled Nursing Facility | Payer: Medicare Other | Source: Skilled Nursing Facility | Attending: Student | Admitting: Student

## 2020-02-20 ENCOUNTER — Encounter (HOSPITAL_COMMUNITY): Payer: Self-pay | Admitting: Emergency Medicine

## 2020-02-20 DIAGNOSIS — L8990 Pressure ulcer of unspecified site, unspecified stage: Secondary | ICD-10-CM | POA: Diagnosis not present

## 2020-02-20 DIAGNOSIS — I480 Paroxysmal atrial fibrillation: Secondary | ICD-10-CM

## 2020-02-20 DIAGNOSIS — Z7401 Bed confinement status: Secondary | ICD-10-CM | POA: Diagnosis not present

## 2020-02-20 DIAGNOSIS — F329 Major depressive disorder, single episode, unspecified: Secondary | ICD-10-CM | POA: Diagnosis not present

## 2020-02-20 DIAGNOSIS — R5381 Other malaise: Secondary | ICD-10-CM | POA: Diagnosis not present

## 2020-02-20 DIAGNOSIS — L89152 Pressure ulcer of sacral region, stage 2: Secondary | ICD-10-CM | POA: Diagnosis present

## 2020-02-20 DIAGNOSIS — R000 Tachycardia, unspecified: Secondary | ICD-10-CM | POA: Diagnosis not present

## 2020-02-20 DIAGNOSIS — M1990 Unspecified osteoarthritis, unspecified site: Secondary | ICD-10-CM | POA: Diagnosis not present

## 2020-02-20 DIAGNOSIS — R2681 Unsteadiness on feet: Secondary | ICD-10-CM | POA: Diagnosis not present

## 2020-02-20 DIAGNOSIS — G4733 Obstructive sleep apnea (adult) (pediatric): Secondary | ICD-10-CM | POA: Diagnosis not present

## 2020-02-20 DIAGNOSIS — Z8249 Family history of ischemic heart disease and other diseases of the circulatory system: Secondary | ICD-10-CM | POA: Diagnosis not present

## 2020-02-20 DIAGNOSIS — Z66 Do not resuscitate: Secondary | ICD-10-CM | POA: Diagnosis not present

## 2020-02-20 DIAGNOSIS — J449 Chronic obstructive pulmonary disease, unspecified: Secondary | ICD-10-CM | POA: Diagnosis not present

## 2020-02-20 DIAGNOSIS — K219 Gastro-esophageal reflux disease without esophagitis: Secondary | ICD-10-CM | POA: Diagnosis present

## 2020-02-20 DIAGNOSIS — W19XXXA Unspecified fall, initial encounter: Secondary | ICD-10-CM | POA: Diagnosis present

## 2020-02-20 DIAGNOSIS — F4310 Post-traumatic stress disorder, unspecified: Secondary | ICD-10-CM | POA: Diagnosis not present

## 2020-02-20 DIAGNOSIS — I499 Cardiac arrhythmia, unspecified: Secondary | ICD-10-CM | POA: Diagnosis not present

## 2020-02-20 DIAGNOSIS — I152 Hypertension secondary to endocrine disorders: Secondary | ICD-10-CM | POA: Diagnosis present

## 2020-02-20 DIAGNOSIS — M6281 Muscle weakness (generalized): Secondary | ICD-10-CM | POA: Diagnosis not present

## 2020-02-20 DIAGNOSIS — J441 Chronic obstructive pulmonary disease with (acute) exacerbation: Secondary | ICD-10-CM | POA: Diagnosis present

## 2020-02-20 DIAGNOSIS — I4892 Unspecified atrial flutter: Secondary | ICD-10-CM | POA: Diagnosis present

## 2020-02-20 DIAGNOSIS — I471 Supraventricular tachycardia: Secondary | ICD-10-CM | POA: Diagnosis not present

## 2020-02-20 DIAGNOSIS — E512 Wernicke's encephalopathy: Secondary | ICD-10-CM | POA: Diagnosis not present

## 2020-02-20 DIAGNOSIS — Z800 Family history of malignant neoplasm of digestive organs: Secondary | ICD-10-CM | POA: Diagnosis not present

## 2020-02-20 DIAGNOSIS — Z888 Allergy status to other drugs, medicaments and biological substances status: Secondary | ICD-10-CM | POA: Diagnosis not present

## 2020-02-20 DIAGNOSIS — E1159 Type 2 diabetes mellitus with other circulatory complications: Secondary | ICD-10-CM | POA: Diagnosis present

## 2020-02-20 DIAGNOSIS — I4819 Other persistent atrial fibrillation: Principal | ICD-10-CM | POA: Diagnosis present

## 2020-02-20 DIAGNOSIS — J984 Other disorders of lung: Secondary | ICD-10-CM | POA: Diagnosis not present

## 2020-02-20 DIAGNOSIS — Z79899 Other long term (current) drug therapy: Secondary | ICD-10-CM

## 2020-02-20 DIAGNOSIS — I2510 Atherosclerotic heart disease of native coronary artery without angina pectoris: Secondary | ICD-10-CM | POA: Diagnosis present

## 2020-02-20 DIAGNOSIS — M6258 Muscle wasting and atrophy, not elsewhere classified, other site: Secondary | ICD-10-CM | POA: Diagnosis not present

## 2020-02-20 DIAGNOSIS — R0902 Hypoxemia: Secondary | ICD-10-CM | POA: Diagnosis not present

## 2020-02-20 DIAGNOSIS — G4730 Sleep apnea, unspecified: Secondary | ICD-10-CM | POA: Diagnosis present

## 2020-02-20 DIAGNOSIS — E119 Type 2 diabetes mellitus without complications: Secondary | ICD-10-CM

## 2020-02-20 DIAGNOSIS — R2689 Other abnormalities of gait and mobility: Secondary | ICD-10-CM | POA: Diagnosis not present

## 2020-02-20 DIAGNOSIS — Z96643 Presence of artificial hip joint, bilateral: Secondary | ICD-10-CM | POA: Diagnosis present

## 2020-02-20 DIAGNOSIS — Z881 Allergy status to other antibiotic agents status: Secondary | ICD-10-CM

## 2020-02-20 DIAGNOSIS — Z794 Long term (current) use of insulin: Secondary | ICD-10-CM

## 2020-02-20 DIAGNOSIS — R1312 Dysphagia, oropharyngeal phase: Secondary | ICD-10-CM | POA: Diagnosis not present

## 2020-02-20 DIAGNOSIS — Z20822 Contact with and (suspected) exposure to covid-19: Secondary | ICD-10-CM | POA: Diagnosis present

## 2020-02-20 DIAGNOSIS — M2550 Pain in unspecified joint: Secondary | ICD-10-CM | POA: Diagnosis not present

## 2020-02-20 DIAGNOSIS — E1165 Type 2 diabetes mellitus with hyperglycemia: Secondary | ICD-10-CM | POA: Diagnosis not present

## 2020-02-20 DIAGNOSIS — E46 Unspecified protein-calorie malnutrition: Secondary | ICD-10-CM | POA: Insufficient documentation

## 2020-02-20 DIAGNOSIS — R008 Other abnormalities of heart beat: Secondary | ICD-10-CM | POA: Diagnosis not present

## 2020-02-20 DIAGNOSIS — Z87891 Personal history of nicotine dependence: Secondary | ICD-10-CM | POA: Diagnosis not present

## 2020-02-20 DIAGNOSIS — Z86010 Personal history of colonic polyps: Secondary | ICD-10-CM

## 2020-02-20 DIAGNOSIS — I951 Orthostatic hypotension: Secondary | ICD-10-CM | POA: Diagnosis present

## 2020-02-20 DIAGNOSIS — I10 Essential (primary) hypertension: Secondary | ICD-10-CM | POA: Diagnosis not present

## 2020-02-20 DIAGNOSIS — Z880 Allergy status to penicillin: Secondary | ICD-10-CM | POA: Diagnosis not present

## 2020-02-20 DIAGNOSIS — F419 Anxiety disorder, unspecified: Secondary | ICD-10-CM | POA: Diagnosis present

## 2020-02-20 DIAGNOSIS — Z7982 Long term (current) use of aspirin: Secondary | ICD-10-CM | POA: Diagnosis not present

## 2020-02-20 DIAGNOSIS — Z9181 History of falling: Secondary | ICD-10-CM | POA: Diagnosis not present

## 2020-02-20 DIAGNOSIS — Z951 Presence of aortocoronary bypass graft: Secondary | ICD-10-CM

## 2020-02-20 DIAGNOSIS — I712 Thoracic aortic aneurysm, without rupture: Secondary | ICD-10-CM | POA: Diagnosis present

## 2020-02-20 DIAGNOSIS — I4891 Unspecified atrial fibrillation: Secondary | ICD-10-CM | POA: Diagnosis present

## 2020-02-20 DIAGNOSIS — E43 Unspecified severe protein-calorie malnutrition: Secondary | ICD-10-CM | POA: Diagnosis not present

## 2020-02-20 DIAGNOSIS — E785 Hyperlipidemia, unspecified: Secondary | ICD-10-CM | POA: Diagnosis not present

## 2020-02-20 DIAGNOSIS — I959 Hypotension, unspecified: Secondary | ICD-10-CM | POA: Diagnosis not present

## 2020-02-20 DIAGNOSIS — R41841 Cognitive communication deficit: Secondary | ICD-10-CM | POA: Diagnosis not present

## 2020-02-20 DIAGNOSIS — Z7901 Long term (current) use of anticoagulants: Secondary | ICD-10-CM

## 2020-02-20 DIAGNOSIS — E1169 Type 2 diabetes mellitus with other specified complication: Secondary | ICD-10-CM | POA: Diagnosis present

## 2020-02-20 DIAGNOSIS — F1010 Alcohol abuse, uncomplicated: Secondary | ICD-10-CM | POA: Diagnosis present

## 2020-02-20 DIAGNOSIS — Z03818 Encounter for observation for suspected exposure to other biological agents ruled out: Secondary | ICD-10-CM | POA: Diagnosis not present

## 2020-02-20 LAB — CBC
HCT: 39.6 % (ref 39.0–52.0)
HCT: 38 % — ABNORMAL LOW (ref 39.0–52.0)
Hemoglobin: 12.5 g/dL — ABNORMAL LOW (ref 13.0–17.0)
Hemoglobin: 11.8 g/dL — ABNORMAL LOW (ref 13.0–17.0)
MCH: 30.6 pg (ref 26.0–34.0)
MCH: 30.1 pg (ref 26.0–34.0)
MCHC: 31.6 g/dL (ref 30.0–36.0)
MCHC: 31.1 g/dL (ref 30.0–36.0)
MCV: 97.1 fL (ref 80.0–100.0)
MCV: 96.9 fL (ref 80.0–100.0)
Platelets: 364 10*3/uL (ref 150–400)
Platelets: 322 10*3/uL (ref 150–400)
RBC: 4.08 MIL/uL — ABNORMAL LOW (ref 4.22–5.81)
RBC: 3.92 MIL/uL — ABNORMAL LOW (ref 4.22–5.81)
RDW: 16.9 % — ABNORMAL HIGH (ref 11.5–15.5)
RDW: 16.7 % — ABNORMAL HIGH (ref 11.5–15.5)
WBC: 8.8 10*3/uL (ref 4.0–10.5)
WBC: 8.9 10*3/uL (ref 4.0–10.5)
nRBC: 0 % (ref 0.0–0.2)
nRBC: 0 % (ref 0.0–0.2)

## 2020-02-20 LAB — SARS CORONAVIRUS 2 BY RT PCR (HOSPITAL ORDER, PERFORMED IN ~~LOC~~ HOSPITAL LAB): SARS Coronavirus 2: NEGATIVE

## 2020-02-20 LAB — COMPREHENSIVE METABOLIC PANEL
ALT: 34 U/L (ref 0–44)
AST: 32 U/L (ref 15–41)
Albumin: 3.3 g/dL — ABNORMAL LOW (ref 3.5–5.0)
Alkaline Phosphatase: 69 U/L (ref 38–126)
Anion gap: 12 (ref 5–15)
BUN: 18 mg/dL (ref 8–23)
CO2: 20 mmol/L — ABNORMAL LOW (ref 22–32)
Calcium: 9.5 mg/dL (ref 8.9–10.3)
Chloride: 104 mmol/L (ref 98–111)
Creatinine, Ser: 1 mg/dL (ref 0.61–1.24)
GFR calc Af Amer: >60 mL/min (ref >60)
GFR calc non Af Amer: >60 mL/min (ref >60)
Glucose, Bld: 173 mg/dL — ABNORMAL HIGH (ref 70–99)
Potassium: 4 mmol/L (ref 3.5–5.1)
Sodium: 136 mmol/L (ref 135–145)
Total Bilirubin: 0.9 mg/dL (ref 0.3–1.2)
Total Protein: 7.2 g/dL (ref 6.5–8.1)

## 2020-02-20 LAB — MAGNESIUM: Magnesium: 2 mg/dL (ref 1.7–2.4)

## 2020-02-20 LAB — PROTIME-INR
INR: 1.2 (ref 0.8–1.2)
Prothrombin Time: 14.5 seconds (ref 11.4–15.2)

## 2020-02-20 LAB — CREATININE, SERUM
Creatinine, Ser: 0.94 mg/dL (ref 0.61–1.24)
GFR calc Af Amer: >60 mL/min (ref >60)
GFR calc non Af Amer: >60 mL/min (ref >60)

## 2020-02-20 LAB — URINALYSIS, ROUTINE W REFLEX MICROSCOPIC
Bilirubin Urine: NEGATIVE
Glucose, UA: 50 mg/dL — AB
Hgb urine dipstick: NEGATIVE
Ketones, ur: NEGATIVE mg/dL
Nitrite: NEGATIVE
Protein, ur: 30 mg/dL — AB
Specific Gravity, Urine: 1.02 (ref 1.005–1.030)
pH: 5 (ref 5.0–8.0)

## 2020-02-20 LAB — APTT: aPTT: 32 seconds (ref 24–36)

## 2020-02-20 MED ORDER — MAGNESIUM CHLORIDE 64 MG PO TBEC
1.0000 | DELAYED_RELEASE_TABLET | Freq: Every day | ORAL | Status: DC
  Administered 2020-02-21 – 2020-03-03 (×12): 64 mg via ORAL
  Filled 2020-02-20 (×12): qty 1

## 2020-02-20 MED ORDER — MONTELUKAST SODIUM 10 MG PO TABS
10.0000 mg | ORAL_TABLET | Freq: Every day | ORAL | Status: DC
  Administered 2020-02-20 – 2020-03-02 (×12): 10 mg via ORAL
  Filled 2020-02-20 (×13): qty 1

## 2020-02-20 MED ORDER — ONDANSETRON HCL 4 MG/2ML IJ SOLN: 4.0000 mg | Freq: Four times a day (QID) | INTRAMUSCULAR | Status: DC | PRN

## 2020-02-20 MED ORDER — DOFETILIDE 125 MCG PO CAPS: 125.0000 ug | ORAL_CAPSULE | Freq: Once | ORAL | Status: DC

## 2020-02-20 MED ORDER — ASPIRIN 81 MG PO CHEW
81.0000 mg | CHEWABLE_TABLET | Freq: Every day | ORAL | Status: DC
  Administered 2020-02-20 – 2020-03-03 (×13): 81 mg via ORAL
  Filled 2020-02-20 (×13): qty 1

## 2020-02-20 MED ORDER — HEPARIN SODIUM (PORCINE) 5000 UNIT/ML IJ SOLN
5000.0000 [IU] | Freq: Three times a day (TID) | INTRAMUSCULAR | Status: DC
  Administered 2020-02-20 – 2020-02-23 (×8): 5000 [IU] via SUBCUTANEOUS
  Filled 2020-02-20 (×8): qty 1

## 2020-02-20 MED ORDER — SODIUM CHLORIDE 0.9 % IV BOLUS
1000.0000 mL | Freq: Once | INTRAVENOUS | Status: AC
  Administered 2020-02-20: 1000 mL via INTRAVENOUS

## 2020-02-20 MED ORDER — UMECLIDINIUM BROMIDE 62.5 MCG/INH IN AEPB
1.0000 | INHALATION_SPRAY | Freq: Every day | RESPIRATORY_TRACT | Status: DC
  Administered 2020-02-24 – 2020-03-02 (×8): 1 via RESPIRATORY_TRACT
  Filled 2020-02-20 (×3): qty 7

## 2020-02-20 MED ORDER — POTASSIUM CHLORIDE CRYS ER 20 MEQ PO TBCR
40.0000 meq | EXTENDED_RELEASE_TABLET | Freq: Every day | ORAL | Status: DC
  Administered 2020-02-21 – 2020-03-01 (×10): 40 meq via ORAL
  Filled 2020-02-20 (×10): qty 2

## 2020-02-20 MED ORDER — THIAMINE HCL 100 MG PO TABS
100.0000 mg | ORAL_TABLET | Freq: Every day | ORAL | Status: DC
  Administered 2020-02-20 – 2020-03-03 (×13): 100 mg via ORAL
  Filled 2020-02-20 (×13): qty 1

## 2020-02-20 MED ORDER — LINAGLIPTIN 5 MG PO TABS
5.0000 mg | ORAL_TABLET | Freq: Every day | ORAL | Status: DC
  Administered 2020-02-21 – 2020-03-03 (×12): 5 mg via ORAL
  Filled 2020-02-20 (×12): qty 1

## 2020-02-20 MED ORDER — FENOFIBRATE 160 MG PO TABS
160.0000 mg | ORAL_TABLET | Freq: Every day | ORAL | Status: DC
  Administered 2020-02-21 – 2020-03-03 (×12): 160 mg via ORAL
  Filled 2020-02-20 (×13): qty 1

## 2020-02-20 MED ORDER — FLUTICASONE FUROATE-VILANTEROL 100-25 MCG/INH IN AEPB
1.0000 | INHALATION_SPRAY | Freq: Every day | RESPIRATORY_TRACT | Status: DC
  Administered 2020-02-24 – 2020-03-03 (×9): 1 via RESPIRATORY_TRACT
  Filled 2020-02-20 (×2): qty 28

## 2020-02-20 MED ORDER — INSULIN GLARGINE (1 UNIT DIAL) 300 UNIT/ML ~~LOC~~ SOPN: 18.0000 [IU] | PEN_INJECTOR | Freq: Every day | SUBCUTANEOUS | Status: DC

## 2020-02-20 MED ORDER — POTASSIUM CHLORIDE CRYS ER 20 MEQ PO TBCR
40.0000 meq | EXTENDED_RELEASE_TABLET | Freq: Every day | ORAL | Status: DC
  Administered 2020-02-20: 40 meq via ORAL
  Filled 2020-02-20: qty 2

## 2020-02-20 MED ORDER — FENOFIBRATE 200 MG PO CAPS: 200.0000 mg | ORAL_CAPSULE | Freq: Every day | ORAL | Status: DC

## 2020-02-20 MED ORDER — DULOXETINE HCL 60 MG PO CPEP
60.0000 mg | ORAL_CAPSULE | Freq: Every day | ORAL | Status: DC
  Administered 2020-02-21 – 2020-03-03 (×12): 60 mg via ORAL
  Filled 2020-02-20 (×12): qty 1

## 2020-02-20 MED ORDER — GABAPENTIN 300 MG PO CAPS
300.0000 mg | ORAL_CAPSULE | Freq: Every day | ORAL | Status: DC | PRN
  Administered 2020-02-21: 300 mg via ORAL
  Filled 2020-02-20: qty 1

## 2020-02-20 MED ORDER — DOFETILIDE 125 MCG PO CAPS
125.0000 ug | ORAL_CAPSULE | Freq: Two times a day (BID) | ORAL | Status: DC
  Administered 2020-02-20 – 2020-02-21 (×3): 125 ug via ORAL
  Filled 2020-02-20 (×5): qty 1

## 2020-02-20 MED ORDER — LOSARTAN POTASSIUM 50 MG PO TABS
100.0000 mg | ORAL_TABLET | Freq: Every day | ORAL | Status: DC
  Administered 2020-02-20 – 2020-03-03 (×13): 100 mg via ORAL
  Filled 2020-02-20 (×13): qty 2

## 2020-02-20 MED ORDER — POTASSIUM CHLORIDE CRYS ER 20 MEQ PO TBCR
20.0000 meq | EXTENDED_RELEASE_TABLET | Freq: Every day | ORAL | Status: DC
  Administered 2020-02-21 – 2020-02-28 (×8): 20 meq via ORAL
  Filled 2020-02-20 (×9): qty 1

## 2020-02-20 MED ORDER — INSULIN GLARGINE 100 UNIT/ML ~~LOC~~ SOLN
18.0000 [IU] | Freq: Every day | SUBCUTANEOUS | Status: DC
  Administered 2020-02-21 – 2020-03-02 (×11): 18 [IU] via SUBCUTANEOUS
  Filled 2020-02-20 (×12): qty 0.18

## 2020-02-20 MED ORDER — PANTOPRAZOLE SODIUM 40 MG PO TBEC
40.0000 mg | DELAYED_RELEASE_TABLET | Freq: Every day | ORAL | Status: DC
  Administered 2020-02-20 – 2020-03-03 (×13): 40 mg via ORAL
  Filled 2020-02-20 (×13): qty 1

## 2020-02-20 MED ORDER — MAGNESIUM 30 MG PO TABS: 30.0000 mg | ORAL_TABLET | Freq: Every morning | ORAL | Status: DC

## 2020-02-20 MED ORDER — FLUTICASONE PROPIONATE 50 MCG/ACT NA SUSP
1.0000 | Freq: Every day | NASAL | Status: DC | PRN
  Filled 2020-02-20: qty 16

## 2020-02-20 MED ORDER — ACETAMINOPHEN 325 MG PO TABS: 650.0000 mg | ORAL_TABLET | ORAL | Status: DC | PRN

## 2020-02-20 MED ORDER — HYDROCORTISONE (PERIANAL) 2.5 % EX CREA
1.0000 "application " | TOPICAL_CREAM | Freq: Every evening | CUTANEOUS | Status: DC | PRN
  Filled 2020-02-20: qty 28.35

## 2020-02-20 MED ORDER — ICOSAPENT ETHYL 1 G PO CAPS
2.0000 g | ORAL_CAPSULE | Freq: Two times a day (BID) | ORAL | Status: DC
  Administered 2020-02-20 – 2020-03-03 (×24): 2 g via ORAL
  Filled 2020-02-20 (×26): qty 2

## 2020-02-20 MED ORDER — FLUTICASONE-UMECLIDIN-VILANT 100-62.5-25 MCG/INH IN AEPB: 1.0000 | INHALATION_SPRAY | Freq: Every day | RESPIRATORY_TRACT | Status: DC

## 2020-02-20 MED ORDER — ALLOPURINOL 300 MG PO TABS
300.0000 mg | ORAL_TABLET | Freq: Every day | ORAL | Status: DC
  Administered 2020-02-21 – 2020-03-03 (×12): 300 mg via ORAL
  Filled 2020-02-20 (×12): qty 1

## 2020-02-20 MED ORDER — ROSUVASTATIN CALCIUM 20 MG PO TABS
40.0000 mg | ORAL_TABLET | Freq: Every day | ORAL | Status: DC
  Administered 2020-02-20 – 2020-03-03 (×13): 40 mg via ORAL
  Filled 2020-02-20 (×13): qty 2

## 2020-02-20 MED ORDER — MAGNESIUM SULFATE 2 GM/50ML IV SOLN
2.0000 g | Freq: Once | INTRAVENOUS | Status: AC
  Administered 2020-02-20: 2 g via INTRAVENOUS
  Filled 2020-02-20: qty 50

## 2020-02-20 NOTE — ED Triage Notes (Signed)
Pt here with ems from rehab where pt was being taken care of after a fall. When ems arrived pts HR was in the 180s pt clammy. EMS gave the full adenosine protocol which was 6, 12 &12. Pt was still in SVT so they gave a Cardizem 10mg  bolus, then was in afib rvr 130s-140s. pts bp was 100 so EMS stopped there and transported pt. Pt a&0 x2. New hx of early dementia.

## 2020-02-20 NOTE — Progress Notes (Signed)
Pharmacy: Dofetilide (Tikosyn) - Initial Consult Assessment and Electrolyte Replacement  Pharmacy consulted to assist in monitoring and replacing electrolytes in this 69 y.o. male admitted on 02/20/2020 undergoing dofetilide re-initiation. First dofetilide dose: 02/20/2020@2200 .  Missed greater than 2 days of medication so will undergo re-initiation.   Assessment:  Patient Exclusion Criteria: If any screening criteria checked as "Yes", then  patient  should NOT receive dofetilide until criteria item is corrected.  If "Yes" please indicate correction plan.  YES  NO Patient  Exclusion Criteria Correction Plan   [x]   []   Baseline QTc interval is greater than or equal to 440 msec. IF above YES box checked dofetilide contraindicated unless patient has ICD; then may proceed if QTc 500-550 msec or with known ventricular conduction abnormalities may proceed with QTc 550-600 msec. QTc 472 with ventricular rate of 138, when corrected closer to 448 >> MD aware and okay to continue.    []   [x]   Patient is known or suspected to have a digoxin level greater than 2 ng/ml: No results found for: DIGOXIN     []   [x]   Creatinine clearance less than 20 ml/min (calculated using Cockcroft-Gault, actual body weight and serum creatinine): Estimated Creatinine Clearance: 91.1 mL/min (by C-G formula based on SCr of 0.94 mg/dL).     []   [x]  Patient has received drugs known to prolong the QT intervals within the last 48 hours (phenothiazines, tricyclics or tetracyclic antidepressants, erythromycin, H-1 antihistamines, cisapride, fluoroquinolones, azithromycin). Updated information on QT prolonging agents is available to be searched on the following database:QT prolonging agents     []   [x]   Patient received a dose of hydrochlorothiazide (Oretic) alone or in any combination including triamterene (Dyazide, Maxzide) in the last 48 hours.    []   [x]  Patient received a medication known to increase dofetilide  plasma concentrations prior to initial dofetilide dose:  . Trimethoprim (Primsol, Proloprim) in the last 36 hours . Verapamil (Calan, Verelan) in the last 36 hours or a sustained release dose in the last 72 hours . Megestrol (Megace) in the last 5 days  . Cimetidine (Tagamet) in the last 6 hours . Ketoconazole (Nizoral) in the last 24 hours . Itraconazole (Sporanox) in the last 48 hours  . Prochlorperazine (Compazine) in the last 36 hours     []   [x]   Patient is known to have a history of torsades de pointes; congenital or acquired long QT syndromes.    []   [x]   Patient has received a Class 1 antiarrhythmic with less than 2 half-lives since last dose. (Disopyramide, Quinidine, Procainamide, Lidocaine, Mexiletine, Flecainide, Propafenone)    []   [x]   Patient has received amiodarone therapy in the past 3 months or amiodarone level is greater than 0.3 ng/ml.    He is not on anticoagulation due to multiple falls - MD aware.   Labs:    Component Value Date/Time   K 4.0 02/20/2020 1645   MG 2.0 02/20/2020 1934     Plan: Potassium: K >/= 4: Appropriate to initiate Tikosyn, no replacement needed    Magnesium: Mg 1.8-2: Give Mg 2 gm IV x1 to prevent Mg from dropping below 1.8 - do not need to recheck Mg. Appropriate to initiate Tikosyn   Thank you for allowing pharmacy to participate in this patient's care    Antonietta Jewel, PharmD, BCCCP Clinical Pharmacist  02/20/2020 10:50 PM  Please check AMION for all [place] phone numbers After 10:00 PM, call [place] [phone]

## 2020-02-20 NOTE — H&P (Signed)
History and Physical   Adam [name] [id number] DOB: Dec 27, 1950 DOA: 02/20/2020  Referring MD/NP/PA: Dr. Sedonia Small  PCP: [name], MD   Outpatient Specialists: [facility]  Patient coming from: Skilled nursing facility  Chief Complaint: Tachycardia  HPI: Adam [name] is a 69 y.o. male with medical history significant of diabetes, coronary artery disease status post coronary artery bypass grafting, atrial fibrillation not on any anticoagulation due to fear of bleed, asthma and COPD who was brought in from skilled facility was A. fib with RVR.  Patient was supposed to be on Tikosyn and apparently has not received it for about 2 days.  He came into the ER where he was noted to have persistent A. fib with RVR.  Has apparently been having palpitations since yesterday.  Patient apparently fell yesterday.  EMS reported arrival at the facility and was found to have a heart rate in the 180s he was clammy.  He received a full adenosine protocol with 612 and 12 mg but still in SVT.  He was given 10 mg Cardizem bolus and patient was noted to be in A. fib with RVR.  He was brought to the ER where he was evaluated again and treated with Cardizem drip no response.  Cardiology consulted and recommended restarting his Tikosyn.  Patient is therefore being admitted to the hospital.  He has no full recollection of what happened.  Denied any chest pain...,.  ED Course: Temperature 97.5 blood pressure 89/79 initially with pulse 124 respirate 31 and oxygen sat 94% room air.  Chemistry appeared to be reasonably within normal CBC also within normal.  Urinalysis no significant findings.  Chest x-ray showed no acute findings.  EKG shows initial A. fib with RVR with minimal ST changes but nonspecific in the inferior leads.  Patient is being admitted and restarted on his home regimen with Tikosyn.  Review of Systems: As per HPI otherwise 10 point review of systems negative.    Past Medical History:    Diagnosis Date  . Allergy   . Anxiety    history of PTSD following CABG  . Ascending aortic aneurysm ([place]) 01/31/2018   43 x 42 mm, pt unaware  . Asthma   . Cardiomegaly 10/17/2017  . Colitis- colonoscopy 2014 07/13/2015  . COPD (chronic obstructive pulmonary disease) ([place])   . Coronary artery disease    x 6  . Depression   . Diabetes mellitus without complication ([place])   . Family history of polyps in the colon   . Finger dislocation    Left pinkie  . GERD (gastroesophageal reflux disease)   . Gout   . H/O atrial fibrillation without current medication    following CABG with no documented episodes since then.  . Heart palpitations   . Hx of adenomatous colonic polyps 08/12/2010  . Hyperlipidemia   . Hypertension   . OA (osteoarthritis)   . OSA (obstructive sleep apnea)    Mild, has not received CPAP yet  . Prediabetes   . RLS (restless legs syndrome)   . 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 ANKLE FRACTURE SURGERY Right 1991  . APPENDECTOMY    . ATRIAL FIBRILLATION ABLATION N/A 03/31/2018   Procedure: ATRIAL FIBRILLATION ABLATION;  Surgeon: [name], MD;  Location: [facility];  Service: Cardiovascular;  Laterality: N/A;  . CARDIAC ELECTROPHYSIOLOGY MAPPING AND ABLATION    . COLONOSCOPY W/ BIOPSIES  2017   x7  . CORONARY ANGIOPLASTY WITH STENT PLACEMENT    . CORONARY ARTERY BYPASS GRAFT    . FINGER SURGERY  04/2018   Small finger left hand  . implantable loop recorder placement  07/02/2019   Medtronic Reveal [place] model [id number] ([id number] [id number]) implanted in office by Dr Rayann Heman  . TEE WITHOUT CARDIOVERSION N/A 03/30/2018   Procedure: TRANSESOPHAGEAL ECHOCARDIOGRAM (TEE);  Surgeon: [name], MD;  Location: [facility];  Service: Cardiovascular;  Laterality: N/A;  . TOTAL HIP ARTHROPLASTY Left   . TOTAL HIP ARTHROPLASTY Right 09/12/2018   Procedure: TOTAL HIP ARTHROPLASTY ANTERIOR APPROACH;   Surgeon: [name], MD;  Location: [facility];  Service: Orthopedics;  Laterality: Right;  70 mins     reports that he quit smoking about 42 years ago. His smoking use included cigarettes. He has a 7.00 pack-year smoking history. He has never used smokeless tobacco. He reports current alcohol use of about 3.0 - 4.0 standard drinks of alcohol per week. He reports that he does not use drugs.  Allergies  Allergen Reactions  . Xarelto [Rivaroxaban] Other (See Comments)    Patient stated he "ended up in the hospital with a rectal bleed"  . Amoxicillin Rash and Other (See Comments)    * SEVERE RASH IN GROIN AREA Has patient had a PCN reaction causing immediate rash, facial/tongue/throat swelling, SOB or lightheadedness with hypotension: no Has patient had a PCN reaction causing severe rash involving mucus membranes or skin necrosis: no Has patient had a PCN reaction that required hospitalization: no Has patient had a PCN reaction occurring within the last 10 years: yes If all of the above answers are "NO", then may proceed with Cephalosporin use.   . Augmentin [Amoxicillin-Pot Clavulanate] Rash and Other (See Comments)    * SEVERE RASH IN GROIN AREA  . Azithromycin Rash and Other (See Comments)    * SEVERE RASH IN GROIN AREA  . Clindamycin/Lincomycin Rash  . Keflex [Cephalexin] Rash    Family History  Problem Relation Age of Onset  . Colon cancer Mother   . Cancer Father        UNKNOWN TYPE  . Heart disease Paternal Grandmother   . Heart disease Paternal Grandfather      Prior to Admission medications   Medication Sig Start Date End Date Taking? Authorizing Provider  allopurinol (ZYLOPRIM) 300 MG tablet Take 1 tablet (300 mg total) by mouth daily. 09/24/19  Yes [name], Alinda Sierras, MD  aspirin 81 MG chewable tablet Chew 1 tablet (81 mg total) by mouth daily. 02/17/20  Yes Domenic Polite, MD  dofetilide [facility]) 125 MCG capsule Take 1 capsule (125 mcg total) by mouth 2 (two) times  daily. May have 90 day refills if patient prefers Patient taking differently: Take 125 mcg by mouth 2 (two) times daily.  10/01/19  Yes Allred, Jeneen Rinks, MD  DULoxetine (CYMBALTA) 60 MG capsule Take 1 capsule (60 mg total) by mouth daily. 01/23/20  Yes [name], Alinda Sierras, MD  fenofibrate micronized (LOFIBRA) 200 MG capsule Take 1 capsule (200 mg total) by mouth daily before breakfast. 08/09/19  Yes Turner, Eber Hong, MD  fluticasone (FLONASE) 50 MCG/ACT nasal spray Place 1 spray into both nostrils daily as needed for allergies or rhinitis.  05/12/13  Yes [provider]  Fluticasone-Umeclidin-Vilant (TRELEGY ELLIPTA) 100-62.5-25 MCG/INH AEPB Inhale 1 puff into the lungs daily. 12/03/19  Yes Parrett, Tammy S, NP  gabapentin (NEURONTIN) 300 MG capsule Take 1 capsule (300 mg total) by  mouth at bedtime. Take at night as needed for restless leg symptoms Patient taking differently: Take 300 mg by mouth daily as needed (for restless leg).  02/16/20  Yes Domenic Polite, MD  hydrocortisone (ANUSOL-HC) 2.5 % rectal cream Use nightly at bedtime for 10 days Patient taking differently: Place 1 application rectally at bedtime as needed (for anal irritation).  11/08/19  Yes Willia Craze, NP  insulin aspart (NOVOLOG) 100 UNIT/ML injection Inject 0-14 Units into the skin in the morning and at bedtime. Sliding Scale: If blood sugar 0-200 = 0 units 201-250 = 2 units 251-300 = 4 units 301-350 = 6 units 351-400 = 8 units 401-450 = 10 units 451-500 = 12 units >500 = 14 units, recheck in 2 hours, then if >400 or <80 call MD. If blood sugar greater than 500, call MD.   Yes [provider]  insulin glargine, 1 Unit Dial, (TOUJEO SOLOSTAR) 300 UNIT/ML Solostar Pen 10 units twice a day with meals Patient taking differently: Inject 18 Units into the skin daily. 10 units twice a day with meals 02/16/20  Yes Domenic Polite, MD  JANUVIA 100 MG tablet TAKE 1 TABLET(100 MG) BY MOUTH DAILY Patient taking differently:  Take 100 mg by mouth daily.  11/22/19  Yes [name], Alinda Sierras, MD  losartan (COZAAR) 100 MG tablet Take 1 tablet (100 mg total) by mouth daily. 10/30/19 02/20/20 Yes [name], Alinda Sierras, MD  magnesium 30 MG tablet Take 1 tablet (30 mg total) by mouth every morning. 02/18/20  Yes Domenic Polite, MD  metFORMIN (GLUCOPHAGE) 500 MG tablet Take 1,000 mg by mouth 2 (two) times daily with a meal.   Yes [provider]  montelukast (SINGULAIR) 10 MG tablet Take 10 mg by mouth at bedtime.   Yes [provider]  potassium chloride SA (KLOR-CON) 20 MEQ tablet Take 2 tablets (40 mEq total) by mouth daily. Patient taking differently: Take 20-40 mEq by mouth See admin instructions. Takes 40 mEq in the morning and 20 mEq in the afternoon. 02/18/20  Yes Domenic Polite, MD  RABEprazole (ACIPHEX) 20 MG tablet TAKE 1 TABLET(20 MG) BY MOUTH DAILY Patient taking differently: Take 20 mg by mouth daily.  12/12/19  Yes [name], Alinda Sierras, MD  rosuvastatin (CRESTOR) 40 MG tablet Take 1 tablet (40 mg total) by mouth daily. 06/27/19 10/24/28 Yes Turner, Eber Hong, MD  thiamine 100 MG tablet Take 100 mg by mouth daily.   Yes [provider]  VASCEPA 1 g capsule TAKE 2 CAPSULES(2 GRAMS) BY MOUTH TWICE DAILY Patient taking differently: Take 2 g by mouth 2 (two) times daily.  11/29/19  Yes Martinique, [name], MD  glucose blood [facility] VERIO) test strip Test once daily.  Dx E11.9 01/26/18   [name], MD  metFORMIN (GLUCOPHAGE) 1000 MG tablet TAKE 1 TABLET BY MOUTH TWICE DAILY WITH A MEAL Patient not taking: No sig reported 04/30/19   [name], Alinda Sierras, MD  metoprolol tartrate (LOPRESSOR) 50 MG tablet Take 1.5 tablets (75 mg total) by mouth 2 (two) times daily. Patient not taking: Reported on 02/20/2020 02/17/20   Domenic Polite, MD  montelukast (SINGULAIR) 10 MG tablet TAKE 1 TABLET BY MOUTH AT BEDTIME Patient not taking: No sig reported 04/30/19   [name], MD    Physical Exam: Vitals:    02/20/20 2045 02/20/20 2100 02/20/20 2115 02/20/20 2247  BP: (!) 161/90 137/87 137/82 (!) 144/88  Pulse: (!) 45 86 83 86  Resp: (!) 25 (!) 24 (!)  23 (!) 21  Temp:    (!) 97.5 F (36.4 C)  TempSrc:    Oral  SpO2: 94% 96% 94% 95%      Constitutional: Frail and weak, no distress Vitals:   02/20/20 2045 02/20/20 2100 02/20/20 2115 02/20/20 2247  BP: (!) 161/90 137/87 137/82 (!) 144/88  Pulse: (!) 45 86 83 86  Resp: (!) 25 (!) 24 (!) 23 (!) 21  Temp:    (!) 97.5 F (36.4 C)  TempSrc:    Oral  SpO2: 94% 96% 94% 95%   Eyes: PERRL, lids and conjunctivae normal ENMT: Mucous membranes are moist. Posterior pharynx clear of any exudate or lesions.Normal dentition.  Neck: normal, supple, no masses, no thyromegaly Respiratory: clear to auscultation bilaterally, no wheezing, no crackles. Normal respiratory effort. No accessory muscle use.  Cardiovascular: Irregularly irregular rate and rhythm, no murmurs / rubs / gallops. No extremity edema. 2+ pedal pulses. No carotid bruits.  Abdomen: no tenderness, no masses palpated. No hepatosplenomegaly. Bowel sounds positive.  Musculoskeletal: no clubbing / cyanosis. No joint deformity upper and lower extremities. Good ROM, no contractures. Normal muscle tone.  Skin: no rashes, lesions, ulcers. No induration Neurologic: CN 2-12 grossly intact. Sensation intact, DTR normal. Strength 5/5 in all 4.  Psychiatric: Slightly confused normal mood.     Labs on Admission: I have personally reviewed following labs and imaging studies  CBC: Recent Labs  Lab 02/14/20 0354 02/16/20 0349 02/20/20 1645 02/20/20 1934  WBC 7.1 5.4 8.8 8.9  HGB 10.0* 11.7* 12.5* 11.8*  HCT 31.6* 36.8* 39.6 38.0*  MCV 96.9 96.6 97.1 96.9  PLT 189 176 364 [id number]   Basic Metabolic Panel: Recent Labs  Lab 02/15/20 0302 02/15/20 0302 02/16/20 0349 02/17/20 0246 02/18/20 0502 02/20/20 1645 02/20/20 1934  NA 137  --  134* 134* 132* 136  --   K 3.8  --  4.5 4.4 4.2 4.0  --     CL 101  --  103 103 101 104  --   CO2 25  --  26 23 23  20*  --   GLUCOSE 179*  --  189* 232* 169* 173*  --   BUN 9  --  7* 8 9 18   --   CREATININE 0.67   < > 0.77 0.72 0.79 1.00 0.94  CALCIUM 8.9  --  9.0 9.2 9.3 9.5  --   MG 1.9  --  2.2 1.9 2.0  --  2.0   < > = values in this interval not displayed.   GFR: Estimated Creatinine Clearance: 91.1 mL/min (by C-G formula based on SCr of 0.94 mg/dL). Liver Function Tests: Recent Labs  Lab 02/14/20 0354 02/20/20 1645  AST 55* 32  ALT 55* 34  ALKPHOS 63 69  BILITOT 1.8* 0.9  PROT 6.1* 7.2  ALBUMIN 2.6* 3.3*   No results for input(s): LIPASE, AMYLASE in the last 168 hours. No results for input(s): AMMONIA in the last 168 hours. Coagulation Profile: Recent Labs  Lab 02/20/20 1645  INR 1.2   Cardiac Enzymes: No results for input(s): CKTOTAL, CKMB, CKMBINDEX, TROPONINI in the last 168 hours. BNP (last 3 results) Recent Labs    12/03/19 1502  PROBNP 266.0*   HbA1C: No results for input(s): HGBA1C in the last 72 hours. CBG: Recent Labs  Lab 02/17/20 1704 02/17/20 2132 02/18/20 0618 02/18/20 1144 02/18/20 1645  GLUCAP 154* 161* 161* 167* 166*   Lipid Profile: No results for input(s): CHOL, HDL, LDLCALC, TRIG, CHOLHDL, LDLDIRECT  in the last 72 hours. Thyroid Function Tests: No results for input(s): TSH, T4TOTAL, FREET4, T3FREE, THYROIDAB in the last 72 hours. Anemia Panel: No results for input(s): VITAMINB12, FOLATE, FERRITIN, TIBC, IRON, RETICCTPCT in the last 72 hours. Urine analysis:    Component Value Date/Time   COLORURINE AMBER (A) 02/20/2020 1957   APPEARANCEUR HAZY (A) 02/20/2020 1957   LABSPEC 1.020 02/20/2020 1957   PHURINE 5.0 02/20/2020 1957   GLUCOSEU 50 (A) 02/20/2020 1957   HGBUR NEGATIVE 02/20/2020 1957   BILIRUBINUR NEGATIVE 02/20/2020 1957   BILIRUBINUR 1+ 08/14/2013 1200   KETONESUR NEGATIVE 02/20/2020 1957   PROTEINUR 30 (A) 02/20/2020 1957   UROBILINOGEN 0.2 08/14/2013 1200   NITRITE  NEGATIVE 02/20/2020 1957   LEUKOCYTESUR TRACE (A) 02/20/2020 1957   Sepsis Labs: @LABRCNTIP (procalcitonin:4,lacticidven:4) ) Recent Results (from the past 240 hour(s))  SARS CORONAVIRUS 2 (TAT 6-24 HRS) Nasopharyngeal Nasopharyngeal Swab     Status: None   Collection Time: 02/16/20  2:49 PM   Specimen: Nasopharyngeal Swab  Result Value Ref Range Status   SARS Coronavirus 2 NEGATIVE NEGATIVE Final    Comment: (NOTE) SARS-CoV-2 target nucleic acids are NOT DETECTED. The SARS-CoV-2 RNA is generally detectable in upper and lower respiratory specimens during the acute phase of infection. Negative results do not preclude SARS-CoV-2 infection, do not rule out co-infections with other pathogens, and should not be used as the sole basis for treatment or other patient management decisions. Negative results must be combined with clinical observations, patient history, and epidemiological information. The expected result is Negative. Fact Sheet for Patients: SugarRoll.be Fact Sheet for Healthcare Providers: https://www.woods-mathews.com/ This test is not yet approved or cleared by the [place] FDA and  has been authorized for detection and/or diagnosis of SARS-CoV-2 by FDA under an Emergency Use Authorization (EUA). This EUA will remain  in effect (meaning this test can be used) for the duration of the COVID-19 declaration under Section 56 4(b)(1) of the Act, 21 U.S.C. section 360bbb-3(b)(1), unless the authorization is terminated or revoked sooner. Performed at [facility], [place] [address], [place], [place] [address]   SARS Coronavirus 2 by RT PCR (hospital order, performed in [facility] hospital lab) Nasopharyngeal Nasopharyngeal Swab     Status: None   Collection Time: 02/20/20  6:38 PM   Specimen: Nasopharyngeal Swab  Result Value Ref Range Status   SARS Coronavirus 2 NEGATIVE NEGATIVE Final    Comment: (NOTE) SARS-CoV-2 target  nucleic acids are NOT DETECTED. The SARS-CoV-2 RNA is generally detectable in upper and lower respiratory specimens during the acute phase of infection. The lowest concentration of SARS-CoV-2 viral copies this assay can detect is 250 copies / mL. A negative result does not preclude SARS-CoV-2 infection and should not be used as the sole basis for treatment or other patient management decisions.  A negative result may occur with improper specimen collection / handling, submission of specimen other than nasopharyngeal swab, presence of viral mutation(s) within the areas targeted by this assay, and inadequate number of viral copies (<250 copies / mL). A negative result must be combined with clinical observations, patient history, and epidemiological information. Fact Sheet for Patients:   StrictlyIdeas.no Fact Sheet for Healthcare Providers: BankingDealers.co.za This test is not yet approved or cleared  by the [place] FDA and has been authorized for detection and/or diagnosis of SARS-CoV-2 by FDA under an Emergency Use Authorization (EUA).  This EUA will remain in effect (meaning this test can be used) for the duration of  the COVID-19 declaration under Section 564(b)(1) of the Act, 21 U.S.C. section 360bbb-3(b)(1), unless the authorization is terminated or revoked sooner. Performed at [facility], [place] [address], [place], [place] [address]      Radiological Exams on Admission: DG Chest Port 1 View  Result Date: 02/20/2020 CLINICAL DATA:  Tachycardia EXAM: PORTABLE CHEST 1 VIEW COMPARISON:  02/08/2020 FINDINGS: Single frontal view of the chest demonstrates stable cardiac silhouette. Postsurgical changes from median sternotomy. Loop recorder again seen left anterior chest. No airspace disease, effusion, or pneumothorax. No acute bony abnormalities. IMPRESSION: 1. No acute intrathoracic process. Electronically Signed   By: Randa Ngo M.D.   On: 02/20/2020 17:08    EKG: Independently reviewed.  Irregularly irregular with a rate of 138 consistent with rapid atrial fibrillation  Assessment/Plan Principal Problem:   Atrial fibrillation with RVR (HCC) Active Problems:   CAD (coronary artery disease)   Hypertension associated with diabetes ([place])   Hyperlipidemia associated with type 2 diabetes mellitus (HCC)   GERD (gastroesophageal reflux disease)   COPD (chronic obstructive pulmonary disease) (HCC)   Type 2 diabetes mellitus, controlled (HCC)   Sleep apnea   Rapid atrial fibrillation ([place])     #1 A. fib with RVR: Patient has not responded well to Cardizem or adenosine previously given.  We will admit the patient and resume his home regimen.  IV metoprolol as tolerated additionally for rate control.  Cardiology to see patient in the morning and further recommendations.  #2 hypertension: Continue home regimen.  #3 hyperlipidemia: Continue statin.  #4 GERD: Continue PPIs  #5 obstructive sleep apnea: CPAP at night as tolerated  #6 diabetes: Continue home regimen with sliding scale insulin  #7 coronary artery disease: Stable.  Continue to monitor on telemetry     DVT prophylaxis: Lovenox Code Status: DNR Family Communication: No family at bedside Disposition Plan: Home Consults called: None but cardiology consult in the morning Admission status: Inpatient  Severity of Illness: The appropriate patient status for this patient is INPATIENT. Inpatient status is judged to be reasonable and necessary in order to provide the required intensity of service to ensure the patient's safety. The patient's presenting symptoms, physical exam findings, and initial radiographic and laboratory data in the context of their chronic comorbidities is felt to place them at high risk for further clinical deterioration. Furthermore, it is not anticipated that the patient will be medically stable for discharge from the hospital  within 2 midnights of admission. The following factors support the patient status of inpatient.   " The patient's presenting symptoms include palpitations. " The worrisome physical exam findings include irregularly irregular and tachycardia. " The initial radiographic and laboratory data are worrisome because of no significant findings. " The chronic co-morbidities include chronic A. fib.   * I certify that at the point of admission it is my clinical judgment that the patient will require inpatient hospital care spanning beyond 2 midnights from the point of admission due to high intensity of service, high risk for further deterioration and high frequency of surveillance required.Barbette Merino MD [facility] Pager [phone]  If 7PM-7AM, please contact night-coverage [email or web address] Password [email or web address]  02/20/2020, 11:13 PM

## 2020-02-20 NOTE — ED Provider Notes (Addendum)
[facility] Emergency Department Provider Note MRN:  [id number]  Arrival date & time: 02/20/20     Chief Complaint   Atrial Fibrillation   History of Present Illness   Adam [name] is a 69 y.o. year-old male with a history of dementia, CAD, COPD, A. fib presenting to the ED with chief complaint of A. fib.  Patient endorsing palpitations since yesterday.  Does not remember when he fell.  States that he fell due to drinking too much.  Denies any chest pain or shortness of breath.  Denies headache, no vision change, no abdominal pain, no recent fever or illness, no cough, no vomiting or diarrhea.  I was unable to obtain an accurate HPI, PMH, or ROS due to the patient's dementia.  Level 5 caveat.  Review of Systems  Positive for palpitations, fall.  Patient's Health History    Past Medical History:  Diagnosis Date  . Allergy   . Anxiety    history of PTSD following CABG  . Ascending aortic aneurysm ([place]) 01/31/2018   43 x 42 mm, pt unaware  . Asthma   . Cardiomegaly 10/17/2017  . Colitis- colonoscopy 2014 07/13/2015  . COPD (chronic obstructive pulmonary disease) ([place])   . Coronary artery disease    x 6  . Depression   . Diabetes mellitus without complication ([place])   . Family history of polyps in the colon   . Finger dislocation    Left pinkie  . GERD (gastroesophageal reflux disease)   . Gout   . H/O atrial fibrillation without current medication    following CABG with no documented episodes since then.  . Heart palpitations   . Hx of adenomatous colonic polyps 08/12/2010  . Hyperlipidemia   . Hypertension   . OA (osteoarthritis)   . OSA (obstructive sleep apnea)    Mild, has not received CPAP yet  . Prediabetes   . RLS (restless legs syndrome)   . 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 ANKLE FRACTURE SURGERY Right 1991  . APPENDECTOMY    . ATRIAL FIBRILLATION ABLATION  N/A 03/31/2018   Procedure: ATRIAL FIBRILLATION ABLATION;  Surgeon: [name], MD;  Location: [facility];  Service: Cardiovascular;  Laterality: N/A;  . CARDIAC ELECTROPHYSIOLOGY MAPPING AND ABLATION    . COLONOSCOPY W/ BIOPSIES  2017   x7  . CORONARY ANGIOPLASTY WITH STENT PLACEMENT    . CORONARY ARTERY BYPASS GRAFT    . FINGER SURGERY  04/2018   [name] finger left hand  . implantable loop recorder placement  07/02/2019   Medtronic Reveal McKay model [id number] ([id number]) implanted in office by Dr Rayann Heman  . TEE WITHOUT CARDIOVERSION N/A 03/30/2018   Procedure: TRANSESOPHAGEAL ECHOCARDIOGRAM (TEE);  Surgeon: [name], MD;  Location: [facility];  Service: Cardiovascular;  Laterality: N/A;  . TOTAL HIP ARTHROPLASTY Left   . TOTAL HIP ARTHROPLASTY Right 09/12/2018   Procedure: TOTAL HIP ARTHROPLASTY ANTERIOR APPROACH;  Surgeon: [name], MD;  Location: [facility];  Service: Orthopedics;  Laterality: Right;  70 mins    Family History  Problem Relation Age of Onset  . Colon cancer Mother   . Cancer Father        UNKNOWN TYPE  . Heart disease Paternal Grandmother   . Heart disease Paternal Grandfather     Social History   Socioeconomic History  . Marital status: Married    Spouse name:  Not on file  . Number of children: 0  . Years of education: Not on file  . Highest education level: Not on file  Occupational History  . Occupation: retired  Tobacco Use  . Smoking status: Former Smoker    Packs/day: 1.00    Years: 7.00    Pack years: 7.00    Types: Cigarettes    Quit date: 10/04/1977    Years since quitting: 42.4  . Smokeless tobacco: Never Used  . Tobacco comment: never smoked over 1 pack   Substance and Sexual Activity  . Alcohol use: Yes    Alcohol/week: 3.0 - 4.0 standard drinks    Types: 1 Glasses of wine, 2 - 3 Shots of liquor per week    Comment: DAILY  . Drug use: No    Comment: Smoked Marijuana back in 1970s  . Sexual activity: Yes  Other Topics  Concern  . 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 Difficulty of Paying Living Expenses: Not very hard  Food Insecurity: No Food Insecurity  . Worried About Charity fundraiser in the Last Year: Never true  . Ran Out of Food in the Last Year: Never true  Transportation Needs: No Transportation Needs  . Lack of Transportation (Medical): No  . Lack of Transportation (Non-Medical): No  Physical Activity: Inactive  . Days of Exercise per Week: 0 days  . Minutes of Exercise per Session: 0 min  Stress: Stress Concern Present  . Feeling of Stress : Very much  Social Connections: Somewhat Isolated  . Frequency of Communication with Friends and Family: More than three times a week  . Frequency of Social Gatherings with Friends and Family: Once a week  . Attends Religious Services: Never  . Active Member of Clubs or Organizations: No  . Attends Archivist Meetings: Never  . Marital Status: Married  Human resources officer Violence:   . Fear of Current or Ex-Partner:   . Emotionally Abused:   Marland Kitchen Physically Abused:   . Sexually Abused:      Physical Exam   Vitals:   02/20/20 1723 02/20/20 1730  BP:  114/72  Pulse: 72 (!) 124  Resp: (!) 23 (!) 31  Temp:    SpO2: 98% 100%    CONSTITUTIONAL: Chronically ill-appearing, NAD NEURO:  Alert and oriented x 3, no focal deficits EYES:  eyes equal and reactive ENT/NECK:  no LAD, no JVD CARDIO: Tachycardic and irregular rate, well-perfused, normal S1 and S2 PULM:  CTAB no wheezing or rhonchi GI/GU:  normal bowel sounds, non-distended, non-tender MSK/SPINE:  No gross deformities, no edema SKIN: Bruising beneath right eye PSYCH:  Appropriate speech and  behavior  *Additional and/or pertinent findings included in MDM below  Diagnostic and Interventional Summary    EKG Interpretation  Date/Time:  Wednesday Feb 20 2020 16:54:20 EDT Ventricular Rate:  138 PR Interval:    QRS Duration: 83 QT Interval:  311 QTC Calculation: 472 R Axis:   25 Text Interpretation: Repol abnrm suggests ischemia, anterolateral Minimal ST elevation, inferior leads Confirmed by Gerlene Fee [phone]) on 02/20/2020 5:48:50 PM      Labs Reviewed  CBC - Abnormal; Notable  for the following components:      Result Value   RBC 4.08 (*)    Hemoglobin 12.5 (*)    RDW 16.9 (*)    All other components within normal limits  COMPREHENSIVE METABOLIC PANEL - Abnormal; Notable for the following components:   CO2 20 (*)    Glucose, Bld 173 (*)    Albumin 3.3 (*)    All other components within normal limits  CULTURE, BLOOD (SINGLE)  APTT  PROTIME-INR  URINALYSIS, ROUTINE W REFLEX MICROSCOPIC    DG Chest Port 1 View  Final Result      Medications  sodium chloride 0.9 % bolus 1,000 mL (0 mLs Intravenous Stopped 02/20/20 1744)     Procedures  /  Critical Care .Critical Care Performed by: Maudie Flakes, MD Authorized by: Maudie Flakes, MD   Critical care provider statement:    Critical care time (minutes):  45   Critical care was necessary to treat or prevent imminent or life-threatening deterioration of the following conditions: A. fib with RVR.   Critical care was time spent personally by me on the following activities:  Discussions with consultants, evaluation of patient's response to treatment, examination of patient, ordering and performing treatments and interventions, ordering and review of laboratory studies, ordering and review of radiographic studies, pulse oximetry, re-evaluation of patient's condition, obtaining history from patient or surrogate and review of old charts  .1-3 Lead EKG Interpretation Performed by: Maudie Flakes, MD Authorized by:  Maudie Flakes, MD     Interpretation: abnormal     ECG rate:  160   ECG rate assessment: tachycardic     Rhythm: atrial fibrillation   Comments:     Cardiac monitoring was ordered to monitor the patient for dysrhythmia.  I personally interpreted the patient's cardiac monitor while at the bedside.     ED Course and Medical Decision Making  I have reviewed the triage vital signs, the nursing notes, and pertinent available records from the EMR.  Listed above are laboratory and imaging tests that I personally ordered, reviewed, and interpreted and then considered in my medical decision making (see below for details).      A. fib with RVR, recent fall, history of alcohol abuse.  Considering primary cardiac A. fib, withdrawal, metabolic disarray, work-up pending.  On my evaluation patient's heart rate is 160 and afebrile RVR, blood pressure 89 systolic, will provide 1 L normal saline and monitor closely.  Rate improving to range of 90 to 125 after liter bolus.  No signs of withdrawal, states that last drink was 5 days ago.  Cardioversion considered, though given patient's mild dementia and mild symptoms, of which she cannot completely give a known time of onset, considering admission for rate control and anticoagulation.  Obtained more information from patient's wife, further clarifying that patient does have dementia and his history is not to be trusted.  Symptoms began suddenly early this afternoon while at the nursing care facility after being walked around by physical therapy.  Had been doing very well in this new setting up until today.  Unfortunately patient has not been receiving his dofetilide medication for the past 2 days and this likely explains his progression to A. fib with RVR.  Discussed case with Dr. Tamala Julian of cardiology, as well as the ED pharmacist.  Best course of action is to admit patient so that he can be closely monitored while we restart his dofetilide medication.  Will  admit to hospitalist  service.  Patient's wife also explained that his fall occurred multiple days ago, did not have any other falls today.  Patient is mildly confused and seems to be explained by dementia but is no focal neurological deficits, CT head today canceled.  Adam Kirks. Sedonia Small, MD [place] [email or web address]  Final Clinical Impressions(s) / ED Diagnoses     ICD-10-CM   1. Atrial fibrillation with RVR ([place])  I48.91     ED Discharge Orders    None       Discharge Instructions Discussed with and Provided to Patient:   Discharge Instructions   None       Maudie Flakes, MD 02/20/20 1754    Maudie Flakes, MD 02/20/20 1759

## 2020-02-21 ENCOUNTER — Ambulatory Visit: Payer: Medicare Other | Admitting: Emergency Medicine

## 2020-02-21 DIAGNOSIS — L8990 Pressure ulcer of unspecified site, unspecified stage: Secondary | ICD-10-CM | POA: Insufficient documentation

## 2020-02-21 LAB — CBC
HCT: 35.1 % — ABNORMAL LOW (ref 39.0–52.0)
Hemoglobin: 11 g/dL — ABNORMAL LOW (ref 13.0–17.0)
MCH: 30.7 pg (ref 26.0–34.0)
MCHC: 31.3 g/dL (ref 30.0–36.0)
MCV: 98 fL (ref 80.0–100.0)
Platelets: 309 10*3/uL (ref 150–400)
RBC: 3.58 MIL/uL — ABNORMAL LOW (ref 4.22–5.81)
RDW: 16.4 % — ABNORMAL HIGH (ref 11.5–15.5)
WBC: 8.1 10*3/uL (ref 4.0–10.5)
nRBC: 0 % (ref 0.0–0.2)

## 2020-02-21 LAB — BASIC METABOLIC PANEL
Anion gap: 10 (ref 5–15)
BUN: 17 mg/dL (ref 8–23)
CO2: 21 mmol/L — ABNORMAL LOW (ref 22–32)
Calcium: 9.3 mg/dL (ref 8.9–10.3)
Chloride: 107 mmol/L (ref 98–111)
Creatinine, Ser: 0.92 mg/dL (ref 0.61–1.24)
GFR calc Af Amer: >60 mL/min (ref >60)
GFR calc non Af Amer: >60 mL/min (ref >60)
Glucose, Bld: 144 mg/dL — ABNORMAL HIGH (ref 70–99)
Potassium: 4.1 mmol/L (ref 3.5–5.1)
Sodium: 138 mmol/L (ref 135–145)

## 2020-02-21 LAB — MAGNESIUM: Magnesium: 2.4 mg/dL (ref 1.7–2.4)

## 2020-02-21 LAB — LIPID PANEL
Cholesterol: 108 mg/dL (ref 0–200)
HDL: 20 mg/dL — ABNORMAL LOW (ref >40)
LDL Cholesterol: 55 mg/dL (ref 0–99)
Total CHOL/HDL Ratio: 5.4 RATIO
Triglycerides: 163 mg/dL — ABNORMAL HIGH (ref <150)
VLDL: 33 mg/dL (ref 0–40)

## 2020-02-21 LAB — GLUCOSE, CAPILLARY
Glucose-Capillary: 141 mg/dL — ABNORMAL HIGH (ref 70–99)
Glucose-Capillary: 164 mg/dL — ABNORMAL HIGH (ref 70–99)

## 2020-02-21 MED ORDER — HALOPERIDOL LACTATE 5 MG/ML IJ SOLN: 2.0000 mg | Freq: Four times a day (QID) | INTRAMUSCULAR | Status: DC | PRN

## 2020-02-21 MED ORDER — DILTIAZEM HCL 25 MG/5ML IV SOLN: 5.0000 mg | Freq: Once | INTRAVENOUS | Status: DC

## 2020-02-21 MED ORDER — DILTIAZEM LOAD VIA INFUSION
5.0000 mg | Freq: Once | INTRAVENOUS | Status: AC
  Administered 2020-02-21: 5 mg via INTRAVENOUS
  Filled 2020-02-21: qty 5

## 2020-02-21 MED ORDER — QUETIAPINE FUMARATE 25 MG PO TABS
25.0000 mg | ORAL_TABLET | Freq: Two times a day (BID) | ORAL | Status: DC
  Administered 2020-02-21 – 2020-03-02 (×21): 25 mg via ORAL
  Filled 2020-02-21 (×21): qty 1

## 2020-02-21 MED ORDER — DILTIAZEM HCL-DEXTROSE 125-5 MG/125ML-% IV SOLN (PREMIX)
5.0000 mg/h | INTRAVENOUS | Status: DC
  Administered 2020-02-21 – 2020-02-22 (×2): 5 mg/h via INTRAVENOUS
  Filled 2020-02-21 (×3): qty 125

## 2020-02-21 MED ORDER — DILTIAZEM LOAD VIA INFUSION
15.0000 mg | Freq: Once | INTRAVENOUS | Status: AC
  Administered 2020-02-21: 15 mg via INTRAVENOUS
  Filled 2020-02-21: qty 15

## 2020-02-21 NOTE — Plan of Care (Signed)
  Problem: Clinical Measurements: Goal: Respiratory complications will improve Outcome: Progressing Goal: Cardiovascular complication will be avoided Outcome: Progressing   

## 2020-02-21 NOTE — Progress Notes (Signed)
   02/21/20 0049  Assess: MEWS Score  Temp 98.3 F (36.8 C)  BP (!) 142/76  Pulse Rate 72  ECG Heart Rate 88  Resp 18  Level of Consciousness Alert  SpO2 94 %  O2 Device Room Air  Assess: MEWS Score  MEWS Temp 0  MEWS Systolic 0  MEWS Pulse 0  MEWS RR 0  MEWS LOC 0  MEWS Score 0  MEWS Score Color Green  Assess: if the MEWS score is Yellow or Red  Were vital signs taken at a resting state? Yes  Focused Assessment Documented focused assessment  Early Detection of Sepsis Score *See Row Information* Low  MEWS guidelines implemented *See Row Information* Yes  Treat  MEWS Interventions Administered scheduled meds/treatments  Take Vital Signs  Increase Vital Sign Frequency  Yellow: Q 2hr X 2 then Q 4hr X 2, if remains yellow, continue Q 4hrs  Escalate  MEWS: Escalate Yellow: discuss with charge nurse/RN and consider discussing with provider and RRT  Notify: Charge Nurse/RN  Name of Charge Nurse/RN Notified Shanell, RN  Date Charge Nurse/RN Notified 02/21/20  Time Charge Nurse/RN Notified 0001  Notify: Provider  Provider Name/Title Opyd  Date Provider Notified 02/21/20  Time Provider Notified 0040  Notification Type Page  Notification Reason Other (Comment) (A-fib tachy)  Response Other (Comment) (12-lead EKG, current vitals)  Date of Provider Response 02/21/20  Time of Provider Response [phone]  Document  Patient Outcome Other (Comment) (Patient still in A-fib)  Progress note created (see row info) Yes

## 2020-02-21 NOTE — Progress Notes (Signed)
PROGRESS NOTE  Adam [name] [id number] DOB: 1951/04/09 DOA: 02/20/2020 PCP: [name], MD  Brief History   The patient has been admitted to a telemetry bed. He has been restarted on his Tikosyn and continued on his diltiazem drip. He has not been well controlled this afternoon and has required an increase in the rate of the drip and a 5 mg bolus.  Consultants  . None  Procedures  . None  Antibiotics   Anti-infectives (From admission, onward)   None    .  Subjective  The patient is resting comfortably. His daughter is at his bedside. The patient is confused which is his baseline according to his daughter.  Objective   Vitals:  Vitals:   02/21/20 1614 02/21/20 1655  BP: 138/90 139/66  Pulse: (!) 57 80  Resp: (!) 21 (!) 24  Temp:    SpO2: 98% 97%   Exam:  Constitutional:  . The patient is confused. No acute distress. Marland Kitchen  Respiratory:  . No increased work of breathing. . No wheezes, rales, or rhonchi . No tactile fremitus Cardiovascular:  . Irregular rate and rhythm . No murmurs, ectopy, or gallups. . No lateral PMI. No thrills. Abdomen:  . Abdomen is soft, non-tender, non-distended . No hernias, masses, or organomegaly . Normoactive bowel sounds.  Musculoskeletal:  . No cyanosis, clubbing, or edema Skin:  . No rashes, lesions, ulcers . palpation of skin: no induration or nodules Neurologic:  . CN 2-12 intact . Sensation all 4 extremities intact Psychiatric:  . Mental status o Mood, affect appropriate o Orientation to person, place, time  . judgment and insight appear intact I have personally reviewed the following:   Today's Data  . Vitals, BMP, CBC  Scheduled Meds: . allopurinol  300 mg Oral Daily  . aspirin  81 mg Oral Daily  . diltiazem  5 mg Intravenous Once  . dofetilide  125 mcg Oral BID  . DULoxetine  60 mg Oral Daily  . fenofibrate  160 mg Oral QAC breakfast  . fluticasone furoate-vilanterol  1 puff Inhalation Daily    And  . umeclidinium bromide  1 puff Inhalation Daily  . heparin  5,000 Units Subcutaneous Q8H  . icosapent Ethyl  2 g Oral BID  . insulin glargine  18 Units Subcutaneous QHS  . linagliptin  5 mg Oral Daily  . losartan  100 mg Oral Daily  . magnesium chloride  1 tablet Oral Daily  . montelukast  10 mg Oral QHS  . pantoprazole  40 mg Oral Daily  . potassium chloride  40 mEq Oral Daily   And  . potassium chloride  20 mEq Oral QAC supper  . QUEtiapine  25 mg Oral BID  . rosuvastatin  40 mg Oral Daily  . thiamine  100 mg Oral Daily   Continuous Infusions: . diltiazem (CARDIZEM) infusion 10 mg/hr (02/21/20 1714)    Principal Problem:   Atrial fibrillation with RVR ([place]) Active Problems:   CAD (coronary artery disease)   Hypertension associated with diabetes ([place])   Hyperlipidemia associated with type 2 diabetes mellitus (H[facility])   GERD (gastroesophageal reflux disease)   COPD (chronic obstructive pulmonary disease) ([facility])   Type 2 diabetes mellitus, controlled (H[facility])   Sleep apnea   Rapid atrial fibrillation ([facility])   Pressure injury of skin   LOS: 1 day   A & P  A. fib with RVR: Patient has not responded well to Cardizem or adenosine previously given.  We will  admit the patient and resume his home regimen.  IV metoprolol as tolerated additionally for rate control.  This morning his rate was well controlled. However, this afternoon it has been higher despite the re-initiation of his Tykosin and his diltiazem drip. He has had the rate of his drip increased and he has been given a 5 mg bolus of diltiazem.  Hypertension: Continue home regimen.  Hyperlipidemia: Continue statin.  GERD: Continue PPIs  Obstructive sleep apnea: CPAP at night as tolerated  Diabetes: Continue home regimen with sliding scale insulin  Coronary artery disease: Stable.  Continue to monitor on telemetry  I have seen and examined this patient myself. I have spent 35 minutes in his evaluation and care.   Ava Swayze, DO [facility] Direct contact: see [email or web address]  7PM-7AM contact night coverage as above 02/21/2020, 6:04 PM  LOS: 1 day

## 2020-02-21 NOTE — Progress Notes (Signed)
Pharmacy: Dofetilide (Tikosyn) - Follow Up Assessment and Electrolyte Replacement  Pharmacy consulted to assist in monitoring and replacing electrolytes in this 69 y.o. male admitted on 02/20/2020 undergoing dofetilide re-initiation. First dofetilide dose: 02/20/20  Labs:    Component Value Date/Time   K 4.1 02/21/2020 0334   MG 2.4 02/21/2020 0334     Plan: Potassium: K >/= 4: No additional supplementation needed  Magnesium: Mg > 2: No additional supplementation needed   Thank you for allowing pharmacy to participate in this patient's care   Arrie Senate, PharmD, BCPS Clinical Pharmacist [phone] Please check AMION for all [place] numbers 02/21/2020

## 2020-02-21 NOTE — Progress Notes (Signed)
   02/20/20 2247  Assess: MEWS Score  Temp (!) 97.5 F (36.4 C)  BP (!) 144/88  Pulse Rate 86  Resp (!) 21  Level of Consciousness Alert  SpO2 95 %  O2 Device Room Air  Assess: MEWS Score  MEWS Temp 0  MEWS Systolic 0  MEWS Pulse 0  MEWS RR 1  MEWS LOC 0  MEWS Score 1  MEWS Score Color Green  Assess: if the MEWS score is Yellow or Red  Were vital signs taken at a resting state? Yes  Focused Assessment Documented focused assessment  Early Detection of Sepsis Score *See Row Information* Low  MEWS guidelines implemented *See Row Information* Yes  Treat  MEWS Interventions Administered scheduled meds/treatments  Take Vital Signs  Increase Vital Sign Frequency  Yellow: Q 2hr X 2 then Q 4hr X 2, if remains yellow, continue Q 4hrs  Escalate  MEWS: Escalate Yellow: discuss with charge nurse/RN and consider discussing with provider and RRT  Notify: Charge Nurse/RN  Name of Charge Nurse/RN Notified Shanell, RN  Date Charge Nurse/RN Notified 02/20/20  Time Charge Nurse/RN Notified 2300  Document  Patient Outcome Other (Comment) (remains in A-fib)  Progress note created (see row info) Yes

## 2020-02-21 NOTE — TOC Benefit Eligibility Note (Signed)
Transition of Care [facility]) Benefit Eligibility Note    Patient Details  Name: Adam [name] MRN: [id number] Date of Birth: [birth year]   Medication/Dose: DOFETILIDE   125 MCG  250 MCG  500 MCG BID  Covered?: Yes  Tier: (TIER- 4 DRUG)  Prescription Coverage Preferred Pharmacy: [facility] with Person/Company/Phone Number:: [email or web address]   @ [employer] #  [id number]  Co-Pay: $ 68.83 FOR EACH PRESCRIPTION     Deductible: Met  Additional Notes: TIKOSYN   125  MCG    250 MCG  500 MCG BID   : NOT COVER NON-FORMULARY   P/A -YES  # [phone]    Memory Argue Phone Number: 02/21/2020, 4:55 PM

## 2020-02-22 DIAGNOSIS — I4891 Unspecified atrial fibrillation: Secondary | ICD-10-CM

## 2020-02-22 LAB — BASIC METABOLIC PANEL
Anion gap: 7 (ref 5–15)
BUN: 11 mg/dL (ref 8–23)
CO2: 21 mmol/L — ABNORMAL LOW (ref 22–32)
Calcium: 9.3 mg/dL (ref 8.9–10.3)
Chloride: 107 mmol/L (ref 98–111)
Creatinine, Ser: 0.82 mg/dL (ref 0.61–1.24)
GFR calc Af Amer: >60 mL/min (ref >60)
GFR calc non Af Amer: >60 mL/min (ref >60)
Glucose, Bld: 162 mg/dL — ABNORMAL HIGH (ref 70–99)
Potassium: 4.1 mmol/L (ref 3.5–5.1)
Sodium: 135 mmol/L (ref 135–145)

## 2020-02-22 LAB — GLUCOSE, CAPILLARY
Glucose-Capillary: 150 mg/dL — ABNORMAL HIGH (ref 70–99)
Glucose-Capillary: 229 mg/dL — ABNORMAL HIGH (ref 70–99)
Glucose-Capillary: 131 mg/dL — ABNORMAL HIGH (ref 70–99)

## 2020-02-22 LAB — MAGNESIUM: Magnesium: 2 mg/dL (ref 1.7–2.4)

## 2020-02-22 MED ORDER — DILTIAZEM HCL-DEXTROSE 125-5 MG/125ML-% IV SOLN (PREMIX)
5.0000 mg/h | INTRAVENOUS | Status: DC
  Administered 2020-02-23 – 2020-02-24 (×4): 10 mg/h via INTRAVENOUS
  Filled 2020-02-22 (×6): qty 125

## 2020-02-22 MED ORDER — DILTIAZEM HCL ER COATED BEADS 120 MG PO CP24: 120.0000 mg | ORAL_CAPSULE | Freq: Every day | ORAL | Status: DC

## 2020-02-22 NOTE — Progress Notes (Signed)
PROGRESS NOTE  Adam [name] [id number] DOB: 10/04/1951 DOA: 02/20/2020 PCP: [name], MD  Brief History   The patient has been admitted to a telemetry bed. He has been restarted on his Tikosyn and continued on his diltiazem drip. He has not been well controlled this afternoon and has required an increase in the rate of the drip and a 5 mg bolus.  Repeat EKG this morning demonstrated continued prolongation of the QTc. Cardiology was consulted and [name] has been stopped. EP cardiology was consulted and they have tried to convert him to oral cardizem without success. He is now back on a higher dose of IV cardizem. They will try to convert him again tomorrow.  Consultants  . Cardiology  Procedures  . None  Antibiotics   Anti-infectives (From admission, onward)   None     Subjective  The patient is resting comfortably. No new complaints.  Objective   Vitals:  Vitals:   02/22/20 1625 02/22/20 1700  BP:  (!) 124/59  Pulse: (!) 107 85  Resp: 19 17  Temp: 98.1 F (36.7 C)   SpO2: 98% 98%   Exam:  Constitutional:  . The patient is confused. No acute distress. Marland Kitchen  Respiratory:  . No increased work of breathing. . No wheezes, rales, or rhonchi . No tactile fremitus Cardiovascular:  . Irregular rate and rhythm . No murmurs, ectopy, or gallups. . No lateral PMI. No thrills. Abdomen:  . Abdomen is soft, non-tender, non-distended . No hernias, masses, or organomegaly . Normoactive bowel sounds.  Musculoskeletal:  . No cyanosis, clubbing, or edema Skin:  . No rashes, lesions, ulcers . palpation of skin: no induration or nodules Neurologic:  . CN 2-12 intact . Sensation all 4 extremities intact Psychiatric:  . Mental status o Mood, affect appropriate o Orientation to person, place, time  . judgment and insight appear intact I have personally reviewed the following:   Today's Data  . Vitals, BMP, CBC  Scheduled Meds: . allopurinol  300 mg Oral  Daily  . aspirin  81 mg Oral Daily  . DULoxetine  60 mg Oral Daily  . fenofibrate  160 mg Oral QAC breakfast  . fluticasone furoate-vilanterol  1 puff Inhalation Daily   And  . umeclidinium bromide  1 puff Inhalation Daily  . heparin  5,000 Units Subcutaneous Q8H  . icosapent Ethyl  2 g Oral BID  . insulin glargine  18 Units Subcutaneous QHS  . linagliptin  5 mg Oral Daily  . losartan  100 mg Oral Daily  . magnesium chloride  1 tablet Oral Daily  . montelukast  10 mg Oral QHS  . pantoprazole  40 mg Oral Daily  . potassium chloride  40 mEq Oral Daily   And  . potassium chloride  20 mEq Oral QAC supper  . QUEtiapine  25 mg Oral BID  . rosuvastatin  40 mg Oral Daily  . thiamine  100 mg Oral Daily   Continuous Infusions: . diltiazem (CARDIZEM) infusion 10 mg/hr (02/22/20 1604)    Principal Problem:   Atrial fibrillation with RVR ([place]) Active Problems:   CAD (coronary artery disease)   Hypertension associated with diabetes ([place])   Hyperlipidemia associated with type 2 diabetes mellitus (HCC)   GERD (gastroesophageal reflux disease)   COPD (chronic obstructive pulmonary disease) (HCC)   Type 2 diabetes mellitus, controlled (HCC)   Sleep apnea   Rapid atrial fibrillation (HCC)   Pressure injury of skin   LOS: 2 days  A & P  A. fib with RVR: Tykosin has been discontinued this am due to lack of response to therapy and prolonged Qtc. Cardiology was consulted as well as EP. His loop recorder was interrogated. It demonstrated that the patient has been going in and out of atrial fibrillation over the past 6 week. They attempted to convert the patient to oral cardizem. The patient's heart rate was elevated. They put him back on a higher dose of cardizem IV. They plan to attempt to convert him to orals again tomorrow.  Hypertension: Continue home regimen.  Hyperlipidemia: Continue statin.  GERD: Continue PPIs  Obstructive sleep apnea: CPAP at night as tolerated  Diabetes:  Continue home regimen with sliding scale insulin  Coronary artery disease: Stable.  Continue to monitor on telemetry  I have seen and examined this patient myself. I have spent 35 minutes in his evaluation and care.  DVT prophylaxis:  CODE STATUS: DNR Family Communication: I have discussed the patient with his wife today. All questions answered to the best of my ability Disposition: The patient is from home. I anticipate discharge to home. Barriers to discharge include control of rate on oral medications.  Heylee Tant, DO [facility] Direct contact: see [email or web address]  7PM-7AM contact night coverage as above 02/22/2020, 6:04 PM  LOS: 1 day

## 2020-02-22 NOTE — Progress Notes (Signed)
Pt's HR went up to 130s while asleep.  Has been up in 100-140s while resting in bed after that.  Asymptomatic.  Charlcie Cradle, PA made aware.  Received to cancel PO cardizem and keep him on cardizem drip and increase cardizem to 10 mg/hr and monitor.  Idolina Primer, RN

## 2020-02-22 NOTE — Consult Note (Addendum)
Cardiology Consultation:   Patient ID: Adam [name] MRN: [id number]; DOB: 1951-08-18  Admit date: 02/20/2020 Date of Consult: 02/22/2020  Primary Care Provider: [name], MD Primary Cardiologist: Adam Standing, MD >> Adam. [place] Primary [name]:  [name], MD    Patient Profile:   Adam [name] is a 69 y.o. male with a hx of HTN, HLD, CAD (CABG 2011 with rports of numerous prior stents), COPD, OSA (intolerant of CPAP), DM, obesity, persistent AFib ([name]/p ablation), ascending Ao aneurysm, + ETOH (historically 3 drinks daily) who is being seen today for the evaluation of Tikosyn management at the request of Adam. Benny [name].  History of Present Illness:   Mr. Adam [name] was admitted to [facility] 10/24/19 with LGIB EP was asked to the case to aid in Tikosyn re-initiation, given the pt had been in HCTZ and had missed 2 days of Tikosyn prior to his admission 2/2 not feeling well. He was in SR, was resumed on his home dose of Tikosyn 138mg BID His QT was stable  Planned to hold his Eliquis and monitor AFib burden to guide decision to restart a/c or not  He saw C. [name], PA in the Afib clinic 11/02/19, no symptoms of Afib, no recurrent bleeding, QTc stable, labs stable.  I [place] him Feb 2021, [place] with some of this false episodes.  At that visit we reviewed prior discussion with Adam. ARayann Hemanregarding his a/c, would be to monitor his Afib burden to decide when/if to resume.  He tells me that regardless, he would never go back on a blood thinner Discussed stroke/embolic risks if he should have increased AFib, he understands, states he does not care and will take his chances.  [facility], [facility] with diagnosis of AcuteEncephalopathy  -Secondary to alcohol withdrawal and Wernicke'[name] encephalopathy.   He was in Afib during this hospitalization initially w/RVR though rate controlled  by discharge, discharge summary notes again discussion regarding a/c and he again adamantly declined.  [facility]  There was some question on if he was getting his Tikosyn at rehab, In review of his MAR from looks like he was getting his Tikosyn as far as I can tell,  Only makes mention of his last dose given there 02/20/20 morning (unknown otherwise)  EP is asked to weigh in on  His Tikosyn management with concerns of QT prolongation.  Discussed his AFib, and that without a/c would not be able to try and restor normal rhythm.  He immediately told me no more blood thinners.   He feels well, denies any CP, palpitations  Or cardiac awareness, no SOB.     AFib Hx PVI ablation 03/31/2018, AdamAllred Initial diagnosis appears [name]-op 2011 after his CABG though Had long hiatus without any until 2019) AAD Tikosyn started 12/13/2017, is current Pt has stated he was on amiodarone back in 2011 briefly, turned his skin purple  MDT ILRimplanted 07/02/19 for AFib surveillence   Past Medical History:  Diagnosis Date  . Allergy   . Anxiety    history of PTSD following CABG  . Ascending aortic aneurysm ([place] 01/31/2018   43 x 42 mm, pt unaware  . Asthma   . Cardiomegaly 10/17/2017  . Colitis- colonoscopy 2014 07/13/2015  . COPD (chronic obstructive pulmonary disease) [place]   . Coronary artery disease    x 6  .  Depression   . Diabetes mellitus without complication ([place])   . Family history of polyps in the colon   . Finger dislocation    Left pinkie  . GERD (gastroesophageal reflux disease)   . Gout   . H/O atrial fibrillation without current medication    following CABG with no documented episodes since then.  . Heart palpitations   . Hx of adenomatous colonic polyps 08/12/2010  . Hyperlipidemia   . Hypertension   . OA (osteoarthritis)   . OSA (obstructive sleep apnea)    Mild, has not received CPAP yet  . Prediabetes   . RLS (restless legs syndrome)     . Squamous cell carcinoma of scalp 2016   Moh'[name]    Past Surgical History:  Procedure Laterality Date  . ANKLE FRACTURE SURGERY Right 1991  . APPENDECTOMY    . ATRIAL FIBRILLATION ABLATION N/A 03/31/2018   Procedure: ATRIAL FIBRILLATION ABLATION;  Surgeon: [name], MD;  Location: [place] [facility];  Service: Cardiovascular;  Laterality: N/A;  . CARDIAC ELECTROPHYSIOLOGY MAPPING AND ABLATION    . COLONOSCOPY W/ BIOPSIES  2017   x7  . CORONARY ANGIOPLASTY WITH STENT PLACEMENT    . CORONARY ARTERY BYPASS GRAFT    . FINGER SURGERY  04/2018   Small finger left hand  . implantable loop recorder placement  07/02/2019   Medtronic Reveal Benton model [id number] (Adam [name]) implanted in office by Adam [name]  . TEE WITHOUT CARDIOVERSION N/A 03/30/2018   Procedure: TRANSESOPHAGEAL ECHOCARDIOGRAM (TEE);  Surgeon: Adam [name] , MD;  Location: [place];  Service: Cardiovascular;  Laterality: N/A;  . TOTAL HIP ARTHROPLASTY Left   . TOTAL HIP ARTHROPLASTY Right 09/12/2018   Procedure: TOTAL HIP ARTHROPLASTY ANTERIOR APPROACH;  Surgeon: [name], MD;  Location: [facility];  Service: Orthopedics;  Laterality: Right;  70 mins     Home Medications:  Prior to Admission medications   Medication Sig Start Date End Date Taking? Authorizing Provider  allopurinol (ZYLOPRIM) 300 MG tablet Take 1 tablet (300 mg total) by mouth daily. 09/24/19  Yes [name], Alinda Sierras, MD  aspirin 81 MG chewable tablet Chew 1 tablet (81 mg total) by mouth daily. 02/17/20  Yes Adam Polite, MD  dofetilide [facility]) 125 MCG capsule Take 1 capsule (125 mcg total) by mouth 2 (two) times daily. May have 90 day refills if patient prefers Patient taking differently: Take 125 mcg by mouth 2 (two) times daily.  10/01/19  Yes [name], Adam Rinks, MD  DULoxetine (CYMBALTA) 60 MG capsule Take 1 capsule (60 mg total) by mouth daily. 01/23/20  Yes [name], Alinda Sierras, MD  fenofibrate micronized (LOFIBRA) 200 MG capsule Take 1 capsule (200  mg total) by mouth daily before breakfast. 08/09/19  Yes Turner, Eber Hong, MD  fluticasone (FLONASE) 50 MCG/ACT nasal spray Place 1 spray into both nostrils daily as needed for allergies or rhinitis.  05/12/13  Yes [provider]  Fluticasone-Umeclidin-Vilant (TRELEGY ELLIPTA) 100-62.5-25 MCG/INH AEPB Inhale 1 puff into the lungs daily. 12/03/19  Yes Parrett, Tammy S, NP  gabapentin (NEURONTIN) 300 MG capsule Take 1 capsule (300 mg total) by mouth at bedtime. Take at night as needed for restless leg symptoms Patient taking differently: Take 300 mg by mouth daily as needed (for restless leg).  02/16/20  Yes Adam Polite, MD  hydrocortisone (ANUSOL-HC) 2.5 % rectal cream Use nightly at bedtime for 10 days Patient taking differently: Place 1 application rectally at bedtime as needed (for anal irritation).  11/08/19  Yes [place] Holstein,  Newell Coral, NP  insulin aspart (NOVOLOG) 100 UNIT/ML injection Inject 0-14 Units into the skin in the morning and at bedtime. Sliding Scale: If blood sugar 0-200 = 0 units 201-250 = 2 units 251-300 = 4 units 301-350 = 6 units 351-400 = 8 units 401-450 = 10 units 451-500 = 12 units >500 = 14 units, recheck in 2 hours, then if >400 or <80 call MD. If blood sugar greater than 500, call MD.   Yes [provider]  insulin glargine, 1 Unit Dial, (TOUJEO SOLOSTAR) 300 UNIT/ML Solostar Pen 10 units twice a day with meals Patient taking differently: Inject 18 Units into the skin daily. 10 units twice a day with meals 02/16/20  Yes Adam Polite, MD  JANUVIA 100 MG tablet TAKE 1 TABLET(100 MG) BY MOUTH DAILY Patient taking differently: Take 100 mg by mouth daily.  11/22/19  Yes [name], Alinda Sierras, MD  losartan (COZAAR) 100 MG tablet Take 1 tablet (100 mg total) by mouth daily. 10/30/19 02/20/20 Yes [name], Alinda Sierras, MD  magnesium 30 MG tablet Take 1 tablet (30 mg total) by mouth every morning. 02/18/20  Yes Adam Polite, MD  metFORMIN (GLUCOPHAGE) 500 MG tablet Take  1,000 mg by mouth 2 (two) times daily with a meal.   Yes [provider]  montelukast (SINGULAIR) 10 MG tablet Take 10 mg by mouth at bedtime.   Yes [provider]  potassium chloride SA (KLOR-CON) 20 MEQ tablet Take 2 tablets (40 mEq total) by mouth daily. Patient taking differently: Take 20-40 mEq by mouth See admin instructions. Takes 40 mEq in the morning and 20 mEq in the afternoon. 02/18/20  Yes Adam Polite, MD  RABEprazole (ACIPHEX) 20 MG tablet TAKE 1 TABLET(20 MG) BY MOUTH DAILY Patient taking differently: Take 20 mg by mouth daily.  12/12/19  Yes [name], Alinda Sierras, MD  rosuvastatin (CRESTOR) 40 MG tablet Take 1 tablet (40 mg total) by mouth daily. 06/27/19 10/24/28 Yes Turner, Eber Hong, MD  thiamine 100 MG tablet Take 100 mg by mouth daily.   Yes [provider]  VASCEPA 1 g capsule TAKE 2 CAPSULES(2 GRAMS) BY MOUTH TWICE DAILY Patient taking differently: Take 2 g by mouth 2 (two) times daily.  11/29/19  Yes Adam, [name], MD  glucose blood [facility] VERIO) test strip Test once daily.  Dx E11.9 01/26/18   Adam Post, MD  metFORMIN (GLUCOPHAGE) 1000 MG tablet TAKE 1 TABLET BY MOUTH TWICE DAILY WITH A MEAL Patient not taking: No sig reported 04/30/19   [name], Alinda Sierras, MD  metoprolol tartrate (LOPRESSOR) 50 MG tablet Take 1.5 tablets (75 mg total) by mouth 2 (two) times daily. Patient not taking: Reported on 02/20/2020 02/17/20   Adam Polite, MD  montelukast (SINGULAIR) 10 MG tablet TAKE 1 TABLET BY MOUTH AT BEDTIME Patient not taking: No sig reported 04/30/19   Adam Post, MD    Inpatient Medications: Scheduled Meds: . allopurinol  300 mg Oral Daily  . aspirin  81 mg Oral Daily  . DULoxetine  60 mg Oral Daily  . fenofibrate  160 mg Oral QAC breakfast  . fluticasone furoate-vilanterol  1 puff Inhalation Daily   And  . umeclidinium bromide  1 puff Inhalation Daily  . heparin  5,000 Units Subcutaneous Q8H  . icosapent Ethyl  2 g Oral  BID  . insulin glargine  18 Units Subcutaneous QHS  . linagliptin  5 mg Oral Daily  . losartan  100 mg Oral Daily  .  magnesium chloride  1 tablet Oral Daily  . montelukast  10 mg Oral QHS  . pantoprazole  40 mg Oral Daily  . potassium chloride  40 mEq Oral Daily   And  . potassium chloride  20 mEq Oral QAC supper  . QUEtiapine  25 mg Oral BID  . rosuvastatin  40 mg Oral Daily  . thiamine  100 mg Oral Daily   Continuous Infusions: . diltiazem (CARDIZEM) infusion 5 mg/hr (02/22/20 1002)   PRN Meds: acetaminophen, fluticasone, gabapentin, hydrocortisone, ondansetron (ZOFRAN) IV  Allergies:    Allergies  Allergen Reactions  . Xarelto [Rivaroxaban] Other (See Comments)    Patient stated he "ended up in the hospital with a rectal bleed"  . Amoxicillin Rash and Other (See Comments)    * SEVERE RASH IN GROIN AREA Has patient had a PCN reaction causing immediate rash, facial/tongue/throat swelling, SOB or lightheadedness with hypotension: no Has patient had a PCN reaction causing severe rash involving mucus membranes or skin necrosis: no Has patient had a PCN reaction that required hospitalization: no Has patient had a PCN reaction occurring within the last 10 years: yes If all of the above answers are "NO", then may proceed with Cephalosporin use.   . Augmentin [Amoxicillin-Pot Clavulanate] Rash and Other (See Comments)    * SEVERE RASH IN GROIN AREA  . Azithromycin Rash and Other (See Comments)    * SEVERE RASH IN GROIN AREA  . Clindamycin/Lincomycin Rash  . Keflex [Cephalexin] Rash    Social History:   Social History   Socioeconomic History  . Marital status: Married    Spouse name: Not on file  . Number of children: 0  . Years of education: Not on file  . Highest education level: Not on file  Occupational History  . Occupation: retired  Tobacco Use  . Smoking status: Former Smoker    Packs/day: 1.00    Years: 7.00    Pack years: 7.00    Types: Cigarettes    Quit  date: 10/04/1977    Years since quitting: 42.4  . Smokeless tobacco: Never Used  . Tobacco comment: never smoked over 1 pack   Substance and Sexual Activity  . Alcohol use: Yes    Alcohol/week: 3.0 - 4.0 standard drinks    Types: 1 Glasses of wine, 2 - 3 Shots of liquor per week    Comment: DAILY  . Drug use: No    Comment: Smoked Marijuana back in 1970s  . Sexual activity: Yes  Other Topics Concern  . 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 Difficulty of Paying Living Expenses: Not very hard  Food Insecurity: No Food Insecurity  . Worried About Charity fundraiser in the Last Year: Never true  . Ran Out of Food in the Last Year: Never true  Transportation Needs: No Transportation Needs  . Lack of Transportation (Medical): No  . Lack of Transportation (Non-Medical): No  Physical Activity: Inactive  . Days of Exercise per Week: 0 days  . Minutes of Exercise per Session: 0 min  Stress: Stress Concern Present  . Feeling of Stress : Very much  Social Connections: Somewhat Isolated  .  Frequency of Communication with Friends and Family: More than three times a week  . Frequency of Social Gatherings with Friends and Family: Once a week  . Attends Religious Services: Never  . Active Member of Clubs or Organizations: No  . Attends Archivist Meetings: Never  . Marital Status: Married  Human resources officer Violence:   . Fear of Current or Ex-Partner:   . Emotionally Abused:   Marland Kitchen Physically Abused:   . Sexually Abused:     Family History:   Family History  Problem Relation Age of Onset  . Colon cancer Mother   . Cancer Father        UNKNOWN TYPE  . Heart disease Paternal Grandmother   . Heart  disease Paternal Grandfather      ROS:  Please see the history of present illness.  All other ROS reviewed and negative.     Physical Exam/Data:   Vitals:   02/22/20 0052 02/22/20 0512 02/22/20 0740 02/22/20 1118  BP: 115/85 114/80  114/90  Pulse: 77 (!) 51    Resp: 14 18    Temp: 97.6 F (36.4 C) 97.7 F (36.5 C) (!) 97.4 F (36.3 C) 98.4 F (36.9 C)  TempSrc: Axillary Axillary Oral Oral  SpO2: 97% 95%    Weight:  100.8 kg      Intake/Output Summary (Last 24 hours) at 02/22/2020 1357 Last data filed at 02/22/2020 0500 Gross per 24 hour  Intake 242.04 ml  Output 700 ml  Net -457.96 ml   Last 3 Weights 02/22/2020 02/21/2020 02/18/2020  Weight (lbs) 222 lb 3.2 oz 222 lb 6.4 oz 237 lb  Weight (kg) 100.789 kg 100.88 kg 107.502 kg     Body mass index is 31.88 kg/m.  General:  Well nourished, well developed, in no acute distress HEENT: normal Lymph: no adenopathy Neck: no JVD Endocrine:  No thryomegaly Vascular: No carotid bruits Cardiac:  irreg-irreg; no murmurs, gallops or rubs Lungs:  CTA b/l, no wheezing, rhonchi or rales  Abd: soft, nontender  Ext: no edema Musculoskeletal:  No deformities Skin: warm and dry  Neuro:  He is AAO to self, date, knows he is a hospital (because of falls), tells me we met in [place], when reminded I work with Adam. Rayann [name], he laughs and apologized. No gross focal motor abnormalities noted Psych:  Normal affect   EKG:  The EKG was personally reviewed and demonstrates:   Afib 138bpm (difficult to assess QT accurately with RVR) AFib 132bpm  AFlutter (atypical) 78bpm, QT 487m,  QTc 5450mAFib 99bpm, QT widely variable with the AFib and difficult to assess   Telemetry:  Telemetry was personally reviewed and demonstrates:    AFib generally 80'[name]    Relevant CV Studies:  02/11/2020: TTE IMPRESSIONS  1. Technically difficult study with limited views and patient  uncooperative. Grossly normal LV systolic function. Left ventricular    ejection fraction, by estimation, is 60 to 65%. Image quality is not  sufficient to assess for regional wall motiuon  abnormalities. There is mild left ventricular hypertrophy. Left  ventricular diastolic parameters are indeterminate.  2. Right ventricle is pooly visualized but grossly normal size with  mildly reduced systolic function.  3. The mitral valve is normal in structure. Mild mitral valve  regurgitation.  4. The aortic valve was not well visualized. Aortic valve regurgitation  is not visualized. No aortic stenosis is present.    10/31/2017 EPS/Ablation CONCLUSIONS: 1. Atrial fibrillation upon presentation.  2. Intracardiac echo reveals  a moderate sized left atrium with four separate pulmonary veins without evidence of pulmonary vein stenosis. 3. Successful electrical isolation and anatomical encircling of all four pulmonary veins with radiofrequency current. A WACA approach was used 3. Additional left atrial ablation was performed with a standard box lesion created along the posterior wall of the left atrium 4. Atrial fibrillation successfully cardioverted to sinus rhythm. 5. No early apparent complications.   10/30/2017: TEE Study Conclusions - Left ventricle: The cavity size was normal. Wall thickness was normal. Systolic function was normal. The estimated ejection fraction was in the range of 55% to 60%. Wall motion was normal; there were no regional wall motion abnormalities. - Mitral valve: Mildly calcified annulus. - Left atrium: No evidence of thrombus in the atrial cavity or appendage. No spontaneous echo contrast was observed. The appendage was morphologically a left appendage, multilobulated, and of normal size. Emptying velocity was mildly reduced. - Right atrium: No evidence of thrombus in the atrial cavity or appendage. - Atrial septum: There was a very small patent foramen ovale. Doppler showed a trivial left-to-right shunt, in the  baseline state.     Laboratory Data:  High Sensitivity Troponin:   Recent Labs  Lab 02/08/20 2244 02/09/20 0802  TROPONINIHS 30* 24*     Chemistry Recent Labs  Lab 02/20/20 1645 02/20/20 1645 02/20/20 1934 02/21/20 0334 02/22/20 0725  NA 136  --   --  138 135  K 4.0  --   --  4.1 4.1  CL 104  --   --  107 107  CO2 20*  --   --  21* 21*  GLUCOSE 173*  --   --  144* 162*  BUN 18  --   --  17 11  CREATININE 1.00   < > 0.94 0.92 0.82  CALCIUM 9.5  --   --  9.3 9.3  GFRNONAA >60   < > >60 >60 >60  GFRAA >60   < > >60 >60 >60  ANIONGAP 12  --   --  10 7   < > = values in this interval not displayed.    Recent Labs  Lab 02/20/20 1645  PROT 7.2  ALBUMIN 3.3*  AST 32  ALT 34  ALKPHOS 69  BILITOT 0.9   Hematology Recent Labs  Lab 02/20/20 1645 02/20/20 1934 02/21/20 0334  WBC 8.8 8.9 8.1  RBC 4.08* 3.92* 3.58*  HGB 12.5* 11.8* 11.0*  HCT 39.6 38.0* 35.1*  MCV 97.1 96.9 98.0  MCH 30.6 30.1 30.7  MCHC 31.6 31.1 31.3  RDW 16.9* 16.7* 16.4*  PLT 364 322 309   BNPNo results for input([name]): BNP, PROBNP in the last 168 hours.  DDimer No results for input([name]): DDIMER in the last 168 hours.   Radiology/Studies:  DG Chest Port 1 View Result Date: 02/20/2020 CLINICAL DATA:  Tachycardia EXAM: PORTABLE CHEST 1 VIEW COMPARISON:  02/08/2020 FINDINGS: Single frontal view of the chest demonstrates stable cardiac silhouette. Postsurgical changes from median sternotomy. Loop recorder again seen left anterior chest. No airspace disease, effusion, or pneumothorax. No acute bony abnormalities. IMPRESSION: 1. No acute intrathoracic process. Electronically Signed   By: Randa Ngo M.D.   On: 02/20/2020 17:08   {  Assessment and Plan:   1. Persistent AFib     CHA2DS2Vasc is 4, pt has declined a/c (as discussed above)     He is currently asymptomatic with his AF and rate controlled   EKGs reviewed going back to 02/14/20 have  all been Afib/flutter Given no a/c, I have  stopped his Tikosyn I will get ILR checked to see if he has had any SR, but this hospital stay is coming up on 48hours and would not want to pursue rhythm control at this juncture   Rate is well controlled on current Dilt gtt at 20m/hr  >> PO 1261mdaily and follow  Adam. KlCaryl Comeso see later today    ADDEND:  Loop has been interrogated, verbally from MDT rep pt has been in/out of Afib since April, last episode certainly > 48 hours. Report being sent via fax, to 6E, not yet received for review Notified by RN that HR at rest again persistently 120'[name] (at rest).  Still on dilt gtt, no PO given yet.  Will increase dilt gtt to 10/hr, if this holds him plan PO tomorrow, pending Adam. KlOlin Piaisit    For questions or updates, please contact [facility] consult [email or web address] for contact info under     Signed, ReBaldwin JamaicaPA-C  02/22/2020 1:57 PM  Atrial fibrillation/flutter-persistent  Atrial fibrillation ablation-previous  GI bleeding prompting discontinuation of anticoagulation  Ascending aortic aneurysm  Coronary artery disease with prior bypass and prior stenting  Encephalopathy-recent question alcohol versus withdrawal versus Wernicke'[name]   Recently hospitalized with encephalopathy.  ECGs and Linq transmissions were reviewed.  The Linq has under sensed atrial arrhythmias which seem to have initiated about 1 month ago.  There is variable regularization of a flutter-like rhythm with an atrial cycle length  290 ms sometimes conducting one-to-one sometimes conducting 2-1 with regular classified by the implantable monitor as sinus, thus suggesting spontaneous reversion back and forth between sinus and A. Fib.  He has been referred to resuming anticoagulation after the GI bleed; however, he says that if anticoagulation is necessary to try to restore sinus rhythm is willing to do so.  We discussed 3 weeks of anticoagulation followed by Tikosyn initiation versus anticoagulation  followed by TEE and Tikosyn anticoagulation.  He would prefer the latter.  We will begin anticoagulation with Eliquis today  Previously on atenolol and metoprolol tartrate;  he tolerated the atenolol better so will initiate that today using the diltiazem adjunctively for rate control pending his TEE  The reinitiation of Tikosyn will have to wait until his TEE so as to avoid the likelihood of restoration of sinus rhythm in the presence of a possible clot not yet excluded by TEE

## 2020-02-22 NOTE — Progress Notes (Signed)
Pharmacy: Dofetilide (Tikosyn) - Follow Up Assessment and Electrolyte Replacement  Pharmacy consulted to assist in monitoring and replacing electrolytes in this 69 y.o. male admitted on 02/20/2020 undergoing dofetilide re-initiation. First dofetilide dose: 02/20/20  Labs:    Component Value Date/Time   K 4.1 02/22/2020 0725   MG 2.0 02/22/2020 0328     Plan: Potassium: K >/= 4: No additional supplementation needed  Magnesium: Mg > 2: No additional supplementation needed   QTc: 531  Plan: -Tikosyn held this morning, cardiology being consulted for further recommendations   Thank you for allowing pharmacy to participate in this patient's care   Arrie Senate, PharmD, BCPS Clinical Pharmacist [phone] Please check AMION for all [place] numbers 02/22/2020

## 2020-02-23 LAB — BASIC METABOLIC PANEL
Anion gap: 8 (ref 5–15)
BUN: 7 mg/dL — ABNORMAL LOW (ref 8–23)
CO2: 24 mmol/L (ref 22–32)
Calcium: 9.9 mg/dL (ref 8.9–10.3)
Chloride: 107 mmol/L (ref 98–111)
Creatinine, Ser: 0.88 mg/dL (ref 0.61–1.24)
GFR calc Af Amer: >60 mL/min (ref >60)
GFR calc non Af Amer: >60 mL/min (ref >60)
Glucose, Bld: 151 mg/dL — ABNORMAL HIGH (ref 70–99)
Potassium: 3.9 mmol/L (ref 3.5–5.1)
Sodium: 139 mmol/L (ref 135–145)

## 2020-02-23 LAB — GLUCOSE, CAPILLARY
Glucose-Capillary: 152 mg/dL — ABNORMAL HIGH (ref 70–99)
Glucose-Capillary: 191 mg/dL — ABNORMAL HIGH (ref 70–99)
Glucose-Capillary: 133 mg/dL — ABNORMAL HIGH (ref 70–99)
Glucose-Capillary: 141 mg/dL — ABNORMAL HIGH (ref 70–99)

## 2020-02-23 LAB — MAGNESIUM: Magnesium: 1.9 mg/dL (ref 1.7–2.4)

## 2020-02-23 MED ORDER — APIXABAN 5 MG PO TABS
5.0000 mg | ORAL_TABLET | Freq: Two times a day (BID) | ORAL | Status: DC
  Administered 2020-02-23 – 2020-03-03 (×19): 5 mg via ORAL
  Filled 2020-02-23 (×19): qty 1

## 2020-02-23 NOTE — Progress Notes (Signed)
Progress Note  Patient Name: Adam [name] Date of Encounter: 02/23/2020  Primary Cardiologist: [name], MD   Subjective   Remains in atrial fibrillation.  Otherwise without major complaint.  Inpatient Medications    Scheduled Meds: . allopurinol  300 mg Oral Daily  . apixaban  5 mg Oral BID  . aspirin  81 mg Oral Daily  . DULoxetine  60 mg Oral Daily  . fenofibrate  160 mg Oral QAC breakfast  . fluticasone furoate-vilanterol  1 puff Inhalation Daily   And  . umeclidinium bromide  1 puff Inhalation Daily  . icosapent Ethyl  2 g Oral BID  . insulin glargine  18 Units Subcutaneous QHS  . linagliptin  5 mg Oral Daily  . losartan  100 mg Oral Daily  . magnesium chloride  1 tablet Oral Daily  . montelukast  10 mg Oral QHS  . pantoprazole  40 mg Oral Daily  . potassium chloride  40 mEq Oral Daily   And  . potassium chloride  20 mEq Oral QAC supper  . QUEtiapine  25 mg Oral BID  . rosuvastatin  40 mg Oral Daily  . thiamine  100 mg Oral Daily   Continuous Infusions: . diltiazem (CARDIZEM) infusion 10 mg/hr (02/23/20 0134)   PRN Meds: acetaminophen, fluticasone, gabapentin, hydrocortisone, ondansetron (ZOFRAN) IV   Vital Signs    Vitals:   02/22/20 2009 02/22/20 2300 02/23/20 0502 02/23/20 0848  BP: 140/73 (!) 144/66 125/84 126/77  Pulse: 69 89 83 82  Resp: (!) 21 (!) 21 20 18   Temp: 98.1 F (36.7 C)  97.6 F (36.4 C) 97.8 F (36.6 C)  TempSrc: Oral  Oral Oral  SpO2: 95% 99% 99% 96%  Weight:   98.4 kg     Intake/Output Summary (Last 24 hours) at 02/23/2020 0958 Last data filed at 02/22/2020 1853 Gross per 24 hour  Intake 720 ml  Output 700 ml  Net 20 ml   Last 3 Weights 02/23/2020 02/22/2020 02/21/2020  Weight (lbs) 216 lb 14.9 oz 222 lb 3.2 oz 222 lb 6.4 oz  Weight (kg) 98.4 kg 100.789 kg 100.88 kg      Telemetry    Atrial fibrillation- Personally Reviewed  ECG    Atrial fibrillation- Personally Reviewed  Physical Exam   GEN: No acute  distress.   Neck: No JVD Cardiac:  Tachycardic, irregular no murmurs, rubs, or gallops.  Respiratory: Clear to auscultation bilaterally. GI: Soft, nontender, non-distended  MS: No edema; No deformity. Neuro:  Nonfocal  Psych: Normal affect   Labs    High Sensitivity Troponin:   Recent Labs  Lab 02/08/20 2244 02/09/20 0802  TROPONINIHS 30* 24*      Chemistry Recent Labs  Lab 02/20/20 1645 02/20/20 1934 02/21/20 0334 02/22/20 0725 02/23/20 0422  NA 136   < > 138 135 139  K 4.0   < > 4.1 4.1 3.9  CL 104   < > 107 107 107  CO2 20*   < > 21* 21* 24  GLUCOSE 173*   < > 144* 162* 151*  BUN 18   < > 17 11 7*  CREATININE 1.00   < > 0.92 0.82 0.88  CALCIUM 9.5   < > 9.3 9.3 9.9  PROT 7.2  --   --   --   --   ALBUMIN 3.3*  --   --   --   --   AST 32  --   --   --   --  ALT 34  --   --   --   --   ALKPHOS 69  --   --   --   --   BILITOT 0.9  --   --   --   --   GFRNONAA >60   < > >60 >60 >60  GFRAA >60   < > >60 >60 >60  ANIONGAP 12   < > 10 7 8    < > = values in this interval not displayed.     Hematology Recent Labs  Lab 02/20/20 1645 02/20/20 1934 02/21/20 0334  WBC 8.8 8.9 8.1  RBC 4.08* 3.92* 3.58*  HGB 12.5* 11.8* 11.0*  HCT 39.6 38.0* 35.1*  MCV 97.1 96.9 98.0  MCH 30.6 30.1 30.7  MCHC 31.6 31.1 31.3  RDW 16.9* 16.7* 16.4*  PLT 364 322 309    BNPNo results for input(s): BNP, PROBNP in the last 168 hours.   DDimer No results for input(s): DDIMER in the last 168 hours.   Radiology    No results found.  Cardiac Studies   TTE 1. Technically difficult study with limited views and patient  uncooperative. Grossly normal LV systolic function. Left ventricular  ejection fraction, by estimation, is 60 to 65%. Image quality is not  sufficient to assess for regional wall motiuon  abnormalities. There is mild left ventricular hypertrophy. Left  ventricular diastolic parameters are indeterminate.  2. Right ventricle is pooly visualized but grossly  normal size with  mildly reduced systolic function.  3. The mitral valve is normal in structure. Mild mitral valve  regurgitation.  4. The aortic valve was not well visualized. Aortic valve regurgitation  is not visualized. No aortic stenosis is present.   Patient Profile     69 y.o. male with history of hypertension, hyperlipidemia, coronary artery disease status post CABG, COPD, OSA, diabetes, obesity, persistent atrial fibrillation who was hospitalized due to acute encephalopathy on dofetilide for his atrial fibrillation.  Assessment & Plan    1.  Persistent atrial fibrillation: CHA2DS2-VASc of 4.  Is currently not anticoagulated.  His atrial fibrillation is rate controlled.  He would like to be back on dofetilide with restoration of sinus rhythm.  I have restarted his Eliquis today.  We Caidon Foti plan for TEE and cardioversion hopefully for Monday.  At this point, if he had does not have a left atrial appendage thrombus, he would be a candidate for Tikosyn reload.  Would continue IV diltiazem through the weekend until his TEE and cardioversion.      For questions or updates, please contact [place] Please consult [email or web address] for contact info under        Signed, Sherly Brodbeck Meredith Leeds, MD  02/23/2020, 9:58 AM

## 2020-02-23 NOTE — Progress Notes (Signed)
MEWS yellow at 1100 for pulse (also yellow for pulse at 1625 on 5/21).  Patient in afib with occasional/rare rapid rate, continues on diltiazem infusion, asymptomatic.

## 2020-02-23 NOTE — Progress Notes (Signed)
PROGRESS NOTE  Adam [name] [id number] DOB: 1951-04-14 DOA: 02/20/2020 PCP: [name], MD  Brief History   The patient has been admitted to a telemetry bed. He has been restarted on his Tikosyn and continued on his diltiazem drip. He has not been well controlled this afternoon and has required an increase in the rate of the drip and a 5 mg bolus.  Repeat EKG this morning demonstrated continued prolongation of the QTc. Cardiology was consulted and Adam [name] has been stopped. EP cardiology was consulted and they have tried to convert him to oral cardizem without success. He is now back on a higher dose of IV cardizem. Cardiology has now decided to leave the patient on IV Cardizem over the weekend with a plan to do TEE and DCCV on Monday followed by reloading to Tikosyn.  Consultants  . Cardiology  Procedures  . None  Antibiotics   Anti-infectives (From admission, onward)   None     Subjective  The patient is resting comfortably. No new complaints.  Objective   Vitals:  Vitals:   02/23/20 1245 02/23/20 1640  BP:  128/76  Pulse: (!) 45 (!) 43  Resp:  (!) 26  Temp:  98.2 F (36.8 C)  SpO2: 92% 95%   Exam:  Constitutional:  . The patient is confused. No acute distress. Marland Kitchen  Respiratory:  . No increased work of breathing. . No wheezes, rales, or rhonchi . No tactile fremitus Cardiovascular:  . Irregular rate and rhythm . No murmurs, ectopy, or gallups. . No lateral PMI. No thrills. Abdomen:  . Abdomen is soft, non-tender, non-distended . No hernias, masses, or organomegaly . Normoactive bowel sounds.  Musculoskeletal:  . No cyanosis, clubbing, or edema Skin:  . No rashes, lesions, ulcers . palpation of skin: no induration or nodules Neurologic:  . CN 2-12 intact . Sensation all 4 extremities intact Psychiatric:  . Mental status o Mood, affect appropriate o Orientation to person, place, time  . judgment and insight appear intact I have personally  reviewed the following:   Today's Data  . Vitals, BMP, CBC  Scheduled Meds: . allopurinol  300 mg Oral Daily  . apixaban  5 mg Oral BID  . aspirin  81 mg Oral Daily  . DULoxetine  60 mg Oral Daily  . fenofibrate  160 mg Oral QAC breakfast  . fluticasone furoate-vilanterol  1 puff Inhalation Daily   And  . umeclidinium bromide  1 puff Inhalation Daily  . icosapent Ethyl  2 g Oral BID  . insulin glargine  18 Units Subcutaneous QHS  . linagliptin  5 mg Oral Daily  . losartan  100 mg Oral Daily  . magnesium chloride  1 tablet Oral Daily  . montelukast  10 mg Oral QHS  . pantoprazole  40 mg Oral Daily  . potassium chloride  40 mEq Oral Daily   And  . potassium chloride  20 mEq Oral QAC supper  . QUEtiapine  25 mg Oral BID  . rosuvastatin  40 mg Oral Daily  . thiamine  100 mg Oral Daily   Continuous Infusions: . diltiazem (CARDIZEM) infusion 10 mg/hr (02/23/20 1417)    Principal Problem:   Atrial fibrillation with RVR ([name]) Active Problems:   CAD (coronary artery disease)   Hypertension associated with diabetes ([name])   Hyperlipidemia associated with type 2 diabetes mellitus (H[facility])   GERD (gastroesophageal reflux disease)   COPD (chronic obstructive pulmonary disease) (HCC)   Type 2 diabetes mellitus, controlled (  [name])   Sleep apnea   Rapid atrial fibrillation (HCC)   Pressure injury of skin   LOS: 3 days   A & P  A. fib with RVR: Tykosin has been discontinued this am due to lack of response to therapy and prolonged Qtc. Cardiology was consulted as well as EP. His loop recorder was interrogated. It demonstrated that the patient has been going in and out of atrial fibrillation over the past 6 week. They attempted to convert the patient to oral cardizem. The patient's heart rate was elevated. They put him back on a higher dose of cardizem IV. Cardiology has now decided to leave the patient on IV Cardizem over the weekend with a plan to do TEE and DCCV on Monday followed by  reloading to Tikosyn.   Hypertension: Continue home regimen.  Hyperlipidemia: Continue statin.  GERD: Continue PPIs  Obstructive sleep apnea: CPAP at night as tolerated  Diabetes: Continue home regimen with sliding scale insulin. FSBS has been 131 - 191.  Coronary artery disease: Stable.  Continue to monitor on telemetry  I have seen and examined this patient myself. I have spent 32 minutes in his evaluation and care.  DVT prophylaxis:  CODE STATUS: DNR Family Communication:None available. Disposition: The patient is from home. I anticipate discharge to home. Barriers to discharge include control of rate on oral medications.  Adam Swayze, DO [facility] Direct contact: see [email or web address]  7PM-7AM contact night coverage as above 02/23/2020, 5:33 PM  LOS: 1 day

## 2020-02-23 NOTE — Progress Notes (Signed)
MEWS red at 1640, pulse reading from pulse ox sensor inaccurate.  VS rechecked; now green.

## 2020-02-24 LAB — GLUCOSE, CAPILLARY
Glucose-Capillary: 144 mg/dL — ABNORMAL HIGH (ref 70–99)
Glucose-Capillary: 135 mg/dL — ABNORMAL HIGH (ref 70–99)
Glucose-Capillary: 149 mg/dL — ABNORMAL HIGH (ref 70–99)
Glucose-Capillary: 129 mg/dL — ABNORMAL HIGH (ref 70–99)

## 2020-02-24 LAB — MAGNESIUM: Magnesium: 1.7 mg/dL (ref 1.7–2.4)

## 2020-02-24 MED ORDER — SODIUM CHLORIDE 0.9 % IV SOLN: INTRAVENOUS | Status: DC

## 2020-02-24 NOTE — Progress Notes (Addendum)
Progress Note  Patient Name: Adam [name] Date of Encounter: 02/24/2020  Primary Cardiologist: [name], MD   Subjective   No complaints this morning. He is anticipating TEE/DCCV tomorrow - agreeable to proceeding after risks/benefits discussion. No complaints of chest pain, SOB, or palpitations.   Inpatient Medications    Scheduled Meds: . allopurinol  300 mg Oral Daily  . apixaban  5 mg Oral BID  . aspirin  81 mg Oral Daily  . DULoxetine  60 mg Oral Daily  . fenofibrate  160 mg Oral QAC breakfast  . fluticasone furoate-vilanterol  1 puff Inhalation Daily   And  . umeclidinium bromide  1 puff Inhalation Daily  . icosapent Ethyl  2 g Oral BID  . insulin glargine  18 Units Subcutaneous QHS  . linagliptin  5 mg Oral Daily  . losartan  100 mg Oral Daily  . magnesium chloride  1 tablet Oral Daily  . montelukast  10 mg Oral QHS  . pantoprazole  40 mg Oral Daily  . potassium chloride  40 mEq Oral Daily   And  . potassium chloride  20 mEq Oral QAC supper  . QUEtiapine  25 mg Oral BID  . rosuvastatin  40 mg Oral Daily  . thiamine  100 mg Oral Daily   Continuous Infusions: . diltiazem (CARDIZEM) infusion 10 mg/hr (02/24/20 0529)   PRN Meds: acetaminophen, fluticasone, gabapentin, hydrocortisone, ondansetron (ZOFRAN) IV   Vital Signs    Vitals:   02/24/20 0558 02/24/20 0805 02/24/20 0946 02/24/20 0949  BP: 91/71 133/66    Pulse: 82 79    Resp: 14 20    Temp: (!) 97.5 F (36.4 C) (!) 97.5 F (36.4 C)    TempSrc: Oral Oral    SpO2: 94% 95% 95% 95%  Weight: 99.5 kg       Intake/Output Summary (Last 24 hours) at 02/24/2020 0951 Last data filed at 02/23/2020 1300 Gross per 24 hour  Intake --  Output 200 ml  Net -200 ml   Filed Weights   02/22/20 0512 02/23/20 0502 02/24/20 0558  Weight: 100.8 kg 98.4 kg 99.5 kg    Telemetry    Atrial fibrillation with CVR and occasional PVCs - Personally Reviewed  ECG    No new tracings - Personally  Reviewed  Physical Exam   GEN: No acute distress.   Neck: No JVD, no carotid bruits Cardiac: IRIR, no murmurs, rubs, or gallops.  Respiratory: Clear to auscultation bilaterally, no wheezes/ rales/ rhonchi GI: NABS, Soft, obese, nontender, non-distended  MS: No edema; No deformity. Neuro:  Nonfocal, moving all extremities spontaneously Psych: Normal affect   Labs    Chemistry Recent Labs  Lab 02/20/20 1645 02/20/20 1934 02/21/20 0334 02/22/20 0725 02/23/20 0422  NA 136   < > 138 135 139  K 4.0   < > 4.1 4.1 3.9  CL 104   < > 107 107 107  CO2 20*   < > 21* 21* 24  GLUCOSE 173*   < > 144* 162* 151*  BUN 18   < > 17 11 7*  CREATININE 1.00   < > 0.92 0.82 0.88  CALCIUM 9.5   < > 9.3 9.3 9.9  PROT 7.2  --   --   --   --   ALBUMIN 3.3*  --   --   --   --   AST 32  --   --   --   --  ALT 34  --   --   --   --   ALKPHOS 69  --   --   --   --   BILITOT 0.9  --   --   --   --   GFRNONAA >60   < > >60 >60 >60  GFRAA >60   < > >60 >60 >60  ANIONGAP 12   < > 10 7 8    < > = values in this interval not displayed.     Hematology Recent Labs  Lab 02/20/20 1645 02/20/20 1934 02/21/20 0334  WBC 8.8 8.9 8.1  RBC 4.08* 3.92* 3.58*  HGB 12.5* 11.8* 11.0*  HCT 39.6 38.0* 35.1*  MCV 97.1 96.9 98.0  MCH 30.6 30.1 30.7  MCHC 31.6 31.1 31.3  RDW 16.9* 16.7* 16.4*  PLT 364 322 309    Cardiac EnzymesNo results for input(s): TROPONINI in the last 168 hours. No results for input(s): TROPIPOC in the last 168 hours.   BNPNo results for input(s): BNP, PROBNP in the last 168 hours.   DDimer No results for input(s): DDIMER in the last 168 hours.   Radiology    No results found.  Cardiac Studies   TTE 02/11/20 1. Technically difficult study with limited views and patient  uncooperative. Grossly normal LV systolic function. Left ventricular  ejection fraction, by estimation, is 60 to 65%. Image quality is not  sufficient to assess for regional wall motiuon  abnormalities. There  is mild left ventricular hypertrophy. Left  ventricular diastolic parameters are indeterminate.  2. Right ventricle is pooly visualized but grossly normal size with  mildly reduced systolic function.  3. The mitral valve is normal in structure. Mild mitral valve  regurgitation.  4. The aortic valve was not well visualized. Aortic valve regurgitation  is not visualized. No aortic stenosis is present.    Patient Profile     69 y.o. male with history of hypertension, hyperlipidemia, coronary artery disease status post CABG, COPD, OSA, diabetes, obesity, persistent atrial fibrillation despite ablation in 2019 who was hospitalized due to acute encephalopathy on dofetilide for his atrial fibrillation.  Assessment & Plan    1. Persistent atrial fibrillation: continues to have persistent Afib with reasonably well controlled rates on telemetry. Previously off tikosyn due to encephalopathy, though now with plans to restart following TEE/DCCV. Eliquis was restarted 02/23/20.  -  After careful review of history and examination, the risks and benefits of transesophageal echocardiogram have been explained including risks of esophageal damage, perforation (1:10,000 risk), bleeding, pharyngeal hematoma as well as other potential complications associated with conscious sedation including aspiration, arrhythmia, respiratory failure and death. Alternatives to treatment were discussed, questions were answered. Patient is willing to proceed. If no thrombus will proceed with DCCV.  - Message sent to [facility] to assist with scheduling - will tentatively plan for TEE/DCCV 02/25/20. He will be NPO after MN.  - Continue diltiazem gtt for now for rate control - Continue eliquis for stroke ppx - Tikosyn plans per EP following TEE/DCCV.    For questions or updates, please contact [place] Please consult [email or web address] for contact info under Cardiology/STEMI.      Signed, Abigail Butts, PA-C  02/24/2020,  9:51 AM   [phone]  The patient was seen and examined, and I agree with the history, physical exam, assessment and plan as documented above.  Patient is without complaints this morning and specifically denies chest pain, palpitations, shortness of breath.  Plan is for TEE/DCCV tomorrow.  Anticoagulated with Eliquis.  Plans for Tikosyn loading per EP afterwards.  Kate Sable, MD, [facility]  02/24/2020 10:13 AM

## 2020-02-24 NOTE — Progress Notes (Addendum)
PROGRESS NOTE  Adam [name] [id number] DOB: [birth year] DOA: 02/20/2020 PCP: [name], MD  Brief History   The patient has been admitted to a telemetry bed. He has been restarted on his Tikosyn and continued on his diltiazem drip. He has not been well controlled this afternoon and has required an increase in the rate of the drip and a 5 mg bolus.  Repeat EKG this morning demonstrated continued prolongation of the QTc. Cardiology was consulted and [name] has been stopped. EP cardiology was consulted and they have tried to convert him to oral cardizem without success. He is now back on a higher dose of IV cardizem. Cardiology has now decided to leave the patient on IV Cardizem over the weekend with a plan to do TEE and DCCV on Monday followed by reloading to Tikosyn.  Consultants  Cardiology  Procedures  None  Antibiotics   Anti-infectives (From admission, onward)    None      Subjective  The patient is resting comfortably. No new complaints.  Objective   Vitals:  Vitals:   02/24/20 0949 02/24/20 1202  BP:  115/85  Pulse:  86  Resp:  20  Temp:  98 F (36.7 C)  SpO2: 95% 97%   Exam:  Constitutional:  The patient is confused. No acute distress.  Respiratory:  No increased work of breathing. No wheezes, rales, or rhonchi No tactile fremitus Cardiovascular:  Irregular rate and rhythm No murmurs, ectopy, or gallups. No lateral PMI. No thrills. Abdomen:  Abdomen is soft, non-tender, non-distended No hernias, masses, or organomegaly Normoactive bowel sounds.  Musculoskeletal:  No cyanosis, clubbing, or edema Skin:  No rashes, lesions, ulcers palpation of skin: no induration or nodules Neurologic:  CN 2-12 intact Sensation all 4 extremities intact Psychiatric:  Mental status Mood, affect appropriate Orientation to person, place, time  judgment and insight appear intact I have personally reviewed the following:   Today's Data  Vitals,  Magnesium  Scheduled Meds:  allopurinol  300 mg Oral Daily   apixaban  5 mg Oral BID   aspirin  81 mg Oral Daily   DULoxetine  60 mg Oral Daily   fenofibrate  160 mg Oral QAC breakfast   fluticasone furoate-vilanterol  1 puff Inhalation Daily   And   umeclidinium bromide  1 puff Inhalation Daily   icosapent Ethyl  2 g Oral BID   insulin glargine  18 Units Subcutaneous QHS   linagliptin  5 mg Oral Daily   losartan  100 mg Oral Daily   magnesium chloride  1 tablet Oral Daily   montelukast  10 mg Oral QHS   pantoprazole  40 mg Oral Daily   potassium chloride  40 mEq Oral Daily   And   potassium chloride  20 mEq Oral QAC supper   QUEtiapine  25 mg Oral BID   rosuvastatin  40 mg Oral Daily   thiamine  100 mg Oral Daily   Continuous Infusions:  diltiazem (CARDIZEM) infusion 10 mg/hr (02/24/20 0529)    Principal Problem:   Atrial fibrillation with RVR ([facility]) Active Problems:   CAD (coronary artery disease)   Hypertension associated with diabetes ([place])   Hyperlipidemia associated with type 2 diabetes mellitus (HCC)   GERD (gastroesophageal reflux disease)   COPD (chronic obstructive pulmonary disease) (H[facility])   Type 2 diabetes mellitus, controlled (H[facility])   Sleep apnea   Rapid atrial fibrillation ([facility])   Pressure injury of skin   LOS: 4 days   A &  P  A. fib with RVR: Tykosin has been discontinued this am due to lack of response to therapy and prolonged Qtc. Cardiology was consulted as well as EP. His loop recorder was interrogated. It demonstrated that the patient has been going in and out of atrial fibrillation over the past 6 week. They attempted to convert the patient to oral cardizem. The patient's heart rate was elevated. They put him back on a higher dose of cardizem IV. Cardiology has now decided to leave the patient on IV Cardizem over the weekend with a plan to do TEE and DCCV on Monday followed by reloading to Tikosyn.    Hypertension: Continue home regimen.     Hyperlipidemia: Continue statin.  Hypomagnesemia: Supplemented. Monitor.   GERD: Continue PPIs  Pressure Ulcer: Medial Sacrum Stage II.Wound care consulted.   Obstructive sleep apnea: CPAP at night as tolerated   Diabetes: Continue home regimen with sliding scale insulin. FSBS has been 133 - 144.   Coronary artery disease: Stable.  Continue to monitor on telemetry   I have seen and examined this patient myself. I have spent 30 minutes in his evaluation and care.  DVT prophylaxis:  CODE STATUS: DNR Family Communication:None available. Disposition: The patient is from home. I anticipate discharge to home. Barriers to discharge include control of rate on oral medecations, plans for TEE and DCCV.  Gracynn Rajewski, DO [facility] Direct contact: see [email or web address]  7PM-7AM contact night coverage as above 02/23/2020, 5:33 PM  LOS: 1 day

## 2020-02-25 ENCOUNTER — Encounter (HOSPITAL_COMMUNITY)
Admission: EM | Disposition: A | Discharge status: Skilled Nursing Facility | Payer: Self-pay | Source: Skilled Nursing Facility | Attending: Internal Medicine

## 2020-02-25 ENCOUNTER — Other Ambulatory Visit (HOSPITAL_COMMUNITY): Payer: Medicare Other

## 2020-02-25 LAB — BASIC METABOLIC PANEL
Anion gap: 9 (ref 5–15)
Anion gap: 9 (ref 5–15)
BUN: 5 mg/dL — ABNORMAL LOW (ref 8–23)
BUN: 6 mg/dL — ABNORMAL LOW (ref 8–23)
CO2: 22 mmol/L (ref 22–32)
CO2: 21 mmol/L — ABNORMAL LOW (ref 22–32)
Calcium: 9.9 mg/dL (ref 8.9–10.3)
Calcium: 10.1 mg/dL (ref 8.9–10.3)
Chloride: 108 mmol/L (ref 98–111)
Chloride: 106 mmol/L (ref 98–111)
Creatinine, Ser: 0.79 mg/dL (ref 0.61–1.24)
Creatinine, Ser: 0.79 mg/dL (ref 0.61–1.24)
GFR calc Af Amer: >60 mL/min (ref >60)
GFR calc Af Amer: >60 mL/min (ref >60)
GFR calc non Af Amer: >60 mL/min (ref >60)
GFR calc non Af Amer: >60 mL/min (ref >60)
Glucose, Bld: 164 mg/dL — ABNORMAL HIGH (ref 70–99)
Glucose, Bld: 152 mg/dL — ABNORMAL HIGH (ref 70–99)
Potassium: 4.6 mmol/L (ref 3.5–5.1)
Potassium: 3.8 mmol/L (ref 3.5–5.1)
Sodium: 139 mmol/L (ref 135–145)
Sodium: 136 mmol/L (ref 135–145)

## 2020-02-25 LAB — CULTURE, BLOOD (SINGLE)
Culture: NO GROWTH
Special Requests: ADEQUATE

## 2020-02-25 LAB — MAGNESIUM
Magnesium: 1.9 mg/dL (ref 1.7–2.4)
Magnesium: 1.7 mg/dL (ref 1.7–2.4)

## 2020-02-25 LAB — PROTIME-INR
INR: 1.3 — ABNORMAL HIGH (ref 0.8–1.2)
Prothrombin Time: 15.3 seconds — ABNORMAL HIGH (ref 11.4–15.2)

## 2020-02-25 LAB — GLUCOSE, CAPILLARY
Glucose-Capillary: 145 mg/dL — ABNORMAL HIGH (ref 70–99)
Glucose-Capillary: 185 mg/dL — ABNORMAL HIGH (ref 70–99)

## 2020-02-25 SURGERY — ECHOCARDIOGRAM, TRANSESOPHAGEAL
Anesthesia: Monitor Anesthesia Care

## 2020-02-25 MED ORDER — SODIUM CHLORIDE 0.9% FLUSH: 3.0000 mL | INTRAVENOUS | Status: DC | PRN

## 2020-02-25 MED ORDER — POTASSIUM CHLORIDE CRYS ER 20 MEQ PO TBCR
40.0000 meq | EXTENDED_RELEASE_TABLET | Freq: Once | ORAL | Status: AC
  Administered 2020-02-25: 40 meq via ORAL
  Filled 2020-02-25: qty 2

## 2020-02-25 MED ORDER — DOFETILIDE 125 MCG PO CAPS
125.0000 ug | ORAL_CAPSULE | Freq: Two times a day (BID) | ORAL | Status: DC
  Administered 2020-02-25 – 2020-03-03 (×15): 125 ug via ORAL
  Filled 2020-02-25 (×16): qty 1

## 2020-02-25 MED ORDER — SODIUM CHLORIDE 0.9 % IV SOLN
250.0000 mL | INTRAVENOUS | Status: DC | PRN
  Administered 2020-02-27: 250 mL via INTRAVENOUS

## 2020-02-25 MED ORDER — MAGNESIUM SULFATE 2 GM/50ML IV SOLN
2.0000 g | Freq: Once | INTRAVENOUS | Status: AC
  Administered 2020-02-25: 2 g via INTRAVENOUS
  Filled 2020-02-25: qty 50

## 2020-02-25 MED ORDER — SODIUM CHLORIDE 0.9% FLUSH
3.0000 mL | Freq: Two times a day (BID) | INTRAVENOUS | Status: DC
  Administered 2020-02-25 – 2020-03-03 (×13): 3 mL via INTRAVENOUS

## 2020-02-25 MED ORDER — DILTIAZEM HCL ER COATED BEADS 180 MG PO CP24
180.0000 mg | ORAL_CAPSULE | Freq: Every day | ORAL | Status: DC
  Administered 2020-02-25 – 2020-02-28 (×4): 180 mg via ORAL
  Filled 2020-02-25 (×4): qty 1

## 2020-02-25 NOTE — Progress Notes (Addendum)
Electrophysiology Rounding Note  Patient Name: Adam [name] Date of Encounter: 02/25/2020  Primary Cardiologist: [name], MD Electrophysiologist: [name], MD   Subjective   The patient is doing OK this am. Converted to NSR overnight on diltiazem. Pt remembers conversation about going back on [place], and still agrees with proceeding.   K 4.7, Mg 1.9.  Inpatient Medications    Scheduled Meds: . allopurinol  300 mg Oral Daily  . apixaban  5 mg Oral BID  . aspirin  81 mg Oral Daily  . DULoxetine  60 mg Oral Daily  . fenofibrate  160 mg Oral QAC breakfast  . fluticasone furoate-vilanterol  1 puff Inhalation Daily   And  . umeclidinium bromide  1 puff Inhalation Daily  . icosapent Ethyl  2 g Oral BID  . insulin glargine  18 Units Subcutaneous QHS  . linagliptin  5 mg Oral Daily  . losartan  100 mg Oral Daily  . magnesium chloride  1 tablet Oral Daily  . montelukast  10 mg Oral QHS  . pantoprazole  40 mg Oral Daily  . potassium chloride  40 mEq Oral Daily   And  . potassium chloride  20 mEq Oral QAC supper  . QUEtiapine  25 mg Oral BID  . rosuvastatin  40 mg Oral Daily  . thiamine  100 mg Oral Daily   Continuous Infusions: . sodium chloride    . diltiazem (CARDIZEM) infusion 10 mg/hr (02/24/20 1855)   PRN Meds: acetaminophen, fluticasone, gabapentin, hydrocortisone, ondansetron (ZOFRAN) IV   Vital Signs    Vitals:   02/24/20 1703 02/24/20 1950 02/25/20 0025 02/25/20 0248  BP: 112/68 115/68 117/88 116/82  Pulse: 97 92 90 92  Resp: 17     Temp: 97.6 F (36.4 C) 97.6 F (36.4 C) 97.8 F (36.6 C) 97.8 F (36.6 C)  TempSrc: Oral Oral Oral Oral  SpO2: 96% 97% 98% 96%  Weight:    99 kg    Intake/Output Summary (Last 24 hours) at 02/25/2020 0728 Last data filed at 02/25/2020 0249 Gross per 24 hour  Intake 360 ml  Output 2400 ml  Net -2040 ml   Filed Weights   02/23/20 0502 02/24/20 0558 02/25/20 0248  Weight: 98.4 kg 99.5 kg 99 kg     Physical Exam    GEN- The patient is chronically ill appearing and appears older than stated age, alert and oriented x 3 today.   Head- normocephalic, atraumatic Eyes-  Sclera clear, conjunctiva pink Ears- hearing intact Oropharynx- clear Neck- supple Lungs- Clear to ausculation bilaterally, normal work of breathing Heart- Regular rate and rhythm, no murmurs, rubs or gallops GI- soft, NT, ND, + BS Extremities- no clubbing, cyanosis, or edema Skin- no rash or lesion Psych- flat affect.  Neuro- strength and sensation are intact  Labs    CBC No results for input(s): WBC, NEUTROABS, HGB, HCT, MCV, PLT in the last 72 hours. Basic Metabolic Panel Recent Labs    02/23/20 0422 02/23/20 0422 02/24/20 0252 02/25/20 0033  NA 139  --   --  139  K 3.9  --   --  4.6  CL 107  --   --  108  CO2 24  --   --  22  GLUCOSE 151*  --   --  164*  BUN 7*  --   --  5*  CREATININE 0.88  --   --  0.79  CALCIUM 9.9  --   --  9.9  MG 1.9   < > 1.7 1.9   < > = values in this interval not displayed.   Liver Function Tests No results for input(s): AST, ALT, ALKPHOS, BILITOT, PROT, ALBUMIN in the last 72 hours. No results for input(s): LIPASE, AMYLASE in the last 72 hours. Cardiac Enzymes No results for input(s): CKTOTAL, CKMB, CKMBINDEX, TROPONINI in the last 72 hours.   Telemetry    NSR 80-90s since ~ 0230 (personally reviewed)  Radiology    No results found.  Patient Profile     Adam [name] is a 69 y.o. male with a past medical history significant for Persistent AF s/p tikosyn, HTN, HLD, GERD, OSA, and recent admission for encephalopathy.  he was admitted from [facility] with fall and palpitations. Found to be in AF with RVR on arrival.  Assessment & Plan    1. Persistent AF s/p AF ablation 03/2018 He is now back on Eliquis as of 5/22. He agrees to continue. CHA2DS2VASC of at least 4   He has spontaneously converted on diltiazem. Suspect exacerbation was related to heavy  ETOH use. QTc stable this am to start low dose tikosyn at 125 mcg BID, which he was on previously.  EKG this am shows NSR at 87 bpm. QTc ~ 460 ms when measured manually and corrected for rate.  Would be a poor candidate for new start tikosyn with falls (since requires [place]) and ETOH abuse. Since he has tolerated well previously, will plan to resume at this time.  As he recovers, may be able to potentially discuss repeat AF ablation.  2. ETOH abuse Encouraged cessation. He states he has been drinking heavily for the past year at least. Stressed importance of reducing/stopping alcohol intake.   3. Disposition Admitted from rehab with fall. Suspect needs continued rehab with ? Component of dementia.   EP will continue to follow along with dosing. Potentially discharge home Wednesday afternoon after 5 doses if QTc remains stable.  Will need continued evaluation from PT for disposition  For questions or updates, please contact [place] Please consult [email or web address] for contact info under Cardiology/STEMI.  Signed, Shirley Friar, PA-C  02/25/2020, 7:28 AM    I have seen, examined the patient, and reviewed the above assessment and plan.  Changes to above are made where necessary.  On exam, RRR.  He has converted to sinus.  We will resume tikosyn and follow closely.   Co Sign: [name], MD 02/26/2020 10:28 PM

## 2020-02-25 NOTE — Progress Notes (Addendum)
Pharmacy: Dofetilide (Tikosyn) - Initial Consult Assessment and Electrolyte Replacement  Pharmacy consulted to assist in monitoring and replacing electrolytes in this 69 y.o. male admitted on 02/20/2020 undergoing dofetilide re-initiation.   Assessment:  Patient Exclusion Criteria: If any screening criteria checked as "Yes", then  patient  should NOT receive dofetilide until criteria item is corrected.  If "Yes" please indicate correction plan.  YES  NO Patient  Exclusion Criteria Correction Plan   [x]   [x]   Baseline QTc interval is greater than or equal to 440 msec. IF above YES box checked dofetilide contraindicated unless patient has ICD; then may proceed if QTc 500-550 msec or with known ventricular conduction abnormalities may proceed with QTc 550-600 msec. QTc = 460 per MD Proceed per EP   []   [x]   Patient is known or suspected to have a digoxin level greater than 2 ng/ml: No results found for: DIGOXIN     []   [x]   Creatinine clearance less than 20 ml/min (calculated using Cockcroft-Gault, actual body weight and serum creatinine): Estimated Creatinine Clearance: 102.8 mL/min (by C-G formula based on SCr of 0.79 mg/dL).     [x]   []  Patient has received drugs known to prolong the QT intervals within the last 48 hours (phenothiazines, tricyclics or tetracyclic antidepressants, erythromycin, H-1 antihistamines, cisapride, fluoroquinolones, azithromycin). Updated information on QT prolonging agents is available to be searched on the following database:QT prolonging agents  Seroquel has a conditional risk. Plans are to monitor the QTc   []   [x]   Patient received a dose of hydrochlorothiazide (Oretic) alone or in any combination including triamterene (Dyazide, Maxzide) in the last 48 hours.    []   [x]  Patient received a medication known to increase dofetilide plasma concentrations prior to initial dofetilide dose:  . Trimethoprim (Primsol, Proloprim) in the last 36  hours . Verapamil (Calan, Verelan) in the last 36 hours or a sustained release dose in the last 72 hours . Megestrol (Megace) in the last 5 days  . Cimetidine (Tagamet) in the last 6 hours . Ketoconazole (Nizoral) in the last 24 hours . Itraconazole (Sporanox) in the last 48 hours  . Prochlorperazine (Compazine) in the last 36 hours     []   [x]   Patient is known to have a history of torsades de pointes; congenital or acquired long QT syndromes.    []   [x]   Patient has received a Class 1 antiarrhythmic with less than 2 half-lives since last dose. (Disopyramide, Quinidine, Procainamide, Lidocaine, Mexiletine, Flecainide, Propafenone)    []   [x]   Patient has received amiodarone therapy in the past 3 months or amiodarone level is greater than 0.3 ng/ml.    Patient has been appropriately anticoagulated with apixaban.  Labs:    Component Value Date/Time   K 4.6 02/25/2020 0033   MG 1.9 02/25/2020 0033     Plan: Potassium: K >/= 4: Appropriate to initiate Tikosyn, no replacement needed    Magnesium: -Magnesium 2gm has already been ordered   Thank you for allowing pharmacy to participate in this patient's care   Hildred Laser, PharmD Clinical Pharmacist **Pharmacist phone directory can now be found on [email or web address] ([email or web address]).  Listed under [place].   Addendum -repeat K was 3.8; he has been receiving Kdur 29meq/day and is also on this PTA  Plan -Kdur 88meq x1  Hildred Laser, PharmD Clinical Pharmacist **Pharmacist phone directory can now be found on [email or web address] ([email or web address]).  Listed under [place].

## 2020-02-25 NOTE — Discharge Instructions (Signed)

## 2020-02-25 NOTE — Care Management Important Message (Signed)
Important Message  Patient Details  Name: Adam [name] MRN: [id number] Date of Birth: 05-09-1951   Medicare Important Message Given:  Yes     [name] 02/25/2020, 2:22 PM

## 2020-02-25 NOTE — Progress Notes (Signed)
Paged by RN that patient is back in NSR. EKG reviewed. DCCV was planned for later this morning. Patient on Diltiazem drip and Eliquis. Per note, has been in and out of a fib over past 6 weeks. No new orders at this time.  Barrington Ellison, MD [facility]

## 2020-02-25 NOTE — Progress Notes (Signed)
Transitions of Care Pharmacist Note  Adam [name] is a 69 y.o. male that has been diagnosed with A Fib and will be prescribed Eliquis (apixaban) at discharge.   Patient Education: I provided the following education on 02/25/20 to the patient: How to take the medication Described what the medication is Signs of bleeding Signs/symptoms of VTE and stroke  Answered their questions  Discharge Medications Plan: The patient wants to have their discharge medications filled by the Transitions of Care pharmacy rather than their usual pharmacy.  The discharge orders pharmacy has been changed to the Transitions of Care pharmacy, the patient will receive a phone call regarding co-pay, and their medications will be delivered by the Transitions of Care pharmacy.    Thank you,   Berenice Bouton, PharmD PGY1 Pharmacy Resident  Please check AMION for all [place] phone numbers After 10:00 PM, call [place] [phone]  Feb 25, 2020

## 2020-02-25 NOTE — Progress Notes (Signed)
PROGRESS NOTE  Adam [name] [id number] DOB: 09-Mar-1951 DOA: 02/20/2020 PCP: [name], MD  Brief History   The patient has been admitted to a telemetry bed. He has been restarted on his Tikosyn and continued on his diltiazem drip. He has not been well controlled this afternoon and has required an increase in the rate of the drip and a 5 mg bolus.  Repeat EKG this morning demonstrated continued prolongation of the QTc. Cardiology was consulted and [name] has been stopped. EP cardiology was consulted and they have tried to convert him to oral cardizem without success. He is now back on a higher dose of IV cardizem. Cardiology has now decided to leave the patient on IV Cardizem over the weekend with a plan to do TEE and DCCV on Monday followed by reloading to Tikosyn. However, this morning's EKG demonstrated that the patient had converted to sinus rhythm. No TEE or DCCV will be necessary. Consultants  . Cardiology  Procedures  . None  Antibiotics   Anti-infectives (From admission, onward)   None     Subjective  The patient is resting comfortably. No new complaints.  Objective   Vitals:  Vitals:   02/25/20 1142 02/25/20 1644  BP: 124/80 122/80  Pulse: (!) 101 (!) 49  Resp:  20  Temp: 97.9 F (36.6 C) 97.7 F (36.5 C)  SpO2: 96% 99%   Exam:  Constitutional:  . The patient is confused. No acute distress. Marland Kitchen  Respiratory:  . No increased work of breathing. . No wheezes, rales, or rhonchi . No tactile fremitus Cardiovascular:  . Irregular rate and rhythm . No murmurs, ectopy, or gallups. . No lateral PMI. No thrills. Abdomen:  . Abdomen is soft, non-tender, non-distended . No hernias, masses, or organomegaly . Normoactive bowel sounds.  Musculoskeletal:  . No cyanosis, clubbing, or edema Skin:  . No rashes, lesions, ulcers . palpation of skin: no induration or nodules Neurologic:  . CN 2-12 intact . Sensation all 4 extremities intact Psychiatric:   . Mental status o Mood, affect appropriate o Orientation to person, place, time  . judgment and insight appear intact I have personally reviewed the following:   Today's Data  . Vitals, Magnesium  Scheduled Meds: . allopurinol  300 mg Oral Daily  . apixaban  5 mg Oral BID  . aspirin  81 mg Oral Daily  . diltiazem  180 mg Oral Daily  . dofetilide  125 mcg Oral BID  . DULoxetine  60 mg Oral Daily  . fenofibrate  160 mg Oral QAC breakfast  . fluticasone furoate-vilanterol  1 puff Inhalation Daily   And  . umeclidinium bromide  1 puff Inhalation Daily  . icosapent Ethyl  2 g Oral BID  . insulin glargine  18 Units Subcutaneous QHS  . linagliptin  5 mg Oral Daily  . losartan  100 mg Oral Daily  . magnesium chloride  1 tablet Oral Daily  . montelukast  10 mg Oral QHS  . pantoprazole  40 mg Oral Daily  . potassium chloride  40 mEq Oral Daily   And  . potassium chloride  20 mEq Oral QAC supper  . QUEtiapine  25 mg Oral BID  . rosuvastatin  40 mg Oral Daily  . sodium chloride flush  3 mL Intravenous Q12H  . thiamine  100 mg Oral Daily   Continuous Infusions: . sodium chloride    . sodium chloride      Principal Problem:   Atrial fibrillation  with RVR ([place]) Active Problems:   CAD (coronary artery disease)   Hypertension associated with diabetes ([place])   Hyperlipidemia associated with type 2 diabetes mellitus (HCC)   GERD (gastroesophageal reflux disease)   COPD (chronic obstructive pulmonary disease) (HCC)   Type 2 diabetes mellitus, controlled (HCC)   Sleep apnea   Rapid atrial fibrillation (HCC)   Pressure injury of skin   LOS: 5 days   A & P  A. fib with RVR: Tykosin has been discontinued this am due to lack of response to therapy and prolonged Qtc. Cardiology was consulted as well as EP. His loop recorder was interrogated. It demonstrated that the patient has been going in and out of atrial fibrillation over the past 6 week. They attempted to convert the patient to  oral cardizem. The patient's heart rate was elevated. They put him back on a higher dose of cardizem IV. Cardiology has now decided to leave the patient on IV Cardizem over the weekend with a plan to do TEE and DCCV on Monday followed by reloading to Tikosyn. However, this morning's EKG demonstrated that the patient had converted to sinus rhythm. No TEE or DCCV will be necessary. He has been started on Cardizem CD 180 mg daily. Monitor.   Hypertension: Continue home regimen.   Hyperlipidemia: Continue statin.  Hypomagnesemia: Supplemented. Monitor.   GERD: Continue PPIs  Pressure Ulcer: Medial Sacrum Stage II.Wound care consulted.   Obstructive sleep apnea: CPAP at night as tolerated   Diabetes: Continue home regimen with sliding scale insulin. FSBS has been 133 - 144.   Coronary artery disease: Stable.  Continue to monitor on telemetry   I have seen and examined this patient myself. I have spent 30 minutes in his evaluation and care.  DVT prophylaxis:  CODE STATUS: DNR Family Communication:None available. Disposition: The patient is from home. I anticipate discharge to home. Barriers to discharge include control of rate on oral medications. The patient is not cleared for discharge.  Ava Swayze, DO [facility] Direct contact: see [email or web address]  7PM-7AM contact night coverage as above 02/25/2020, 5:45 PM  LOS: 1 day

## 2020-02-25 NOTE — Progress Notes (Signed)
On call provider notified pt has converted to NSR; EKG in chart

## 2020-02-25 NOTE — Progress Notes (Signed)
Morning EKG reviewed  Shows remains in NSR at 94 bpm with relatively stable QTc at ~470 ms when measured manually.  Continue Tikosyn 125 mcg BID.   Pt will not require DCCV   Annamaria Helling  Pager: [phone]  02/25/2020 1:16 PM

## 2020-02-25 NOTE — Progress Notes (Signed)
Pt's wife called this AM to inquire about procedure time. Informed spouse that patient converted on his own overnight and at this time procedure is on hold.

## 2020-02-25 NOTE — Progress Notes (Signed)
Pt in NSR. Verbal orders given by Dr. Audie Box to cancel case.

## 2020-02-26 LAB — BASIC METABOLIC PANEL
Anion gap: 11 (ref 5–15)
BUN: 6 mg/dL — ABNORMAL LOW (ref 8–23)
CO2: 21 mmol/L — ABNORMAL LOW (ref 22–32)
Calcium: 9.8 mg/dL (ref 8.9–10.3)
Chloride: 106 mmol/L (ref 98–111)
Creatinine, Ser: 0.84 mg/dL (ref 0.61–1.24)
GFR calc Af Amer: >60 mL/min (ref >60)
GFR calc non Af Amer: >60 mL/min (ref >60)
Glucose, Bld: 147 mg/dL — ABNORMAL HIGH (ref 70–99)
Potassium: 4.7 mmol/L (ref 3.5–5.1)
Sodium: 138 mmol/L (ref 135–145)

## 2020-02-26 LAB — MAGNESIUM: Magnesium: 2 mg/dL (ref 1.7–2.4)

## 2020-02-26 LAB — GLUCOSE, CAPILLARY: Glucose-Capillary: 153 mg/dL — ABNORMAL HIGH (ref 70–99)

## 2020-02-26 NOTE — Progress Notes (Signed)
Pharmacy: Dofetilide (Tikosyn) - Follow Up Assessment and Electrolyte Replacement  Pharmacy consulted to assist in monitoring and replacing electrolytes in this 69 y.o. male admitted on 02/20/2020 undergoing dofetilide re-initiation.   Labs:    Component Value Date/Time   K 4.7 02/26/2020 0430   MG 2.0 02/26/2020 0430     Plan: Potassium: K >/= 4: No additional supplementation needed  Magnesium: -Mg= 2.0, No additional suppl54ment needed   As patient has required on average 60 mEq of potassium replacement every day, recommend discharging patient with prescription for:  Potassium chloride 50meq in am and 94meq at dinner  Thank you for allowing pharmacy to participate in this patient's care    Hildred Laser, PharmD Clinical Pharmacist **Pharmacist phone directory can now be found on [email or web address] ([email or web address]).  Listed under [place].

## 2020-02-26 NOTE — Progress Notes (Signed)
  Morning EKG reviewed  Shows remains in NSR at 86 bpm with stable QTc at ~470 ms when measured manually  Continue Tikosyn 125 mcg BID.   Pt will not require DCCV   Shirley Friar, [place]  Pager: [phone]  02/26/2020

## 2020-02-26 NOTE — Progress Notes (Addendum)
Electrophysiology Rounding Note  Patient Name: Adam [name] Date of Encounter: 02/26/2020  Primary Cardiologist: [name], MD  Electrophysiologist: [name], MD    Subjective   Pt remains in NSR on Tikosyn 125 mcg BID   EKG was not performed last night. Discussed with todays RN and last nights RN. New RN who was not aware to not calculate off of tele alone.   The patient is doing well today and has no new complaints. He has no questions at this time.   Inpatient Medications    Scheduled Meds: . allopurinol  300 mg Oral Daily  . apixaban  5 mg Oral BID  . aspirin  81 mg Oral Daily  . diltiazem  180 mg Oral Daily  . dofetilide  125 mcg Oral BID  . DULoxetine  60 mg Oral Daily  . fenofibrate  160 mg Oral QAC breakfast  . fluticasone furoate-vilanterol  1 puff Inhalation Daily   And  . umeclidinium bromide  1 puff Inhalation Daily  . icosapent Ethyl  2 g Oral BID  . insulin glargine  18 Units Subcutaneous QHS  . linagliptin  5 mg Oral Daily  . losartan  100 mg Oral Daily  . magnesium chloride  1 tablet Oral Daily  . montelukast  10 mg Oral QHS  . pantoprazole  40 mg Oral Daily  . potassium chloride  40 mEq Oral Daily   And  . potassium chloride  20 mEq Oral QAC supper  . QUEtiapine  25 mg Oral BID  . rosuvastatin  40 mg Oral Daily  . sodium chloride flush  3 mL Intravenous Q12H  . thiamine  100 mg Oral Daily   Continuous Infusions: . sodium chloride    . sodium chloride     PRN Meds: sodium chloride, acetaminophen, fluticasone, gabapentin, hydrocortisone, ondansetron (ZOFRAN) IV, sodium chloride flush   Vital Signs    Vitals:   02/26/20 0132 02/26/20 0428 02/26/20 0902 02/26/20 1001  BP: 119/86 122/66  137/84  Pulse: 90 92    Resp:  19    Temp: 97.6 F (36.4 C) 97.7 F (36.5 C)    TempSrc: Oral Oral    SpO2: 98% 99% 99%   Weight: 105 kg       Intake/Output Summary (Last 24 hours) at 02/26/2020 1404 Last data filed at 02/26/2020  1003 Gross per 24 hour  Intake 366 ml  Output 6 ml  Net 360 ml   Filed Weights   02/24/20 0558 02/25/20 0248 02/26/20 0132  Weight: 99.5 kg 99 kg 105 kg    Physical Exam    GEN- The patient is elderly appearing appearing, alert and oriented x 3 today.   Head- normocephalic, atraumatic Eyes-  Sclera clear, conjunctiva pink Ears- hearing intact Oropharynx- clear Neck- supple Lungs- Clear to ausculation bilaterally, normal work of breathing Heart- Regular rate and rhythm, no murmurs, rubs or gallops GI- soft, NT, ND, + BS Extremities- no clubbing, cyanosis, or edema Skin- no rash or lesion Psych- euthymic mood, full affect Neuro- strength and sensation are intact  Labs    CBC No results for input(s): WBC, NEUTROABS, HGB, HCT, MCV, PLT in the last 72 hours. Basic Metabolic Panel Recent Labs    02/25/20 0830 02/26/20 0430  NA 136 138  K 3.8 4.7  CL 106 106  CO2 21* 21*  GLUCOSE 152* 147*  BUN 6* 6*  CREATININE 0.79 0.84  CALCIUM 10.1 9.8  MG 1.7 2.0  Potassium  Date/Time Value Ref Range Status  02/26/2020 04:30 AM 4.7 3.5 - 5.1 mmol/L Final    Comment:    SPECIMEN HEMOLYZED. HEMOLYSIS MAY AFFECT INTEGRITY OF RESULTS.   Magnesium  Date/Time Value Ref Range Status  02/26/2020 04:30 AM 2.0 1.7 - 2.4 mg/dL Final    Comment:    Performed at [facility], [place] [address] [place], [place] [address]    Telemetry    NSR [phone] (personally reviewed)  Radiology    No results found.   Patient Profile     Adam [name] is a 69 y.o. male with a past medical history significant for persistent atrial fibrillation.  They were admitted for tikosyn load.   Assessment & Plan    1. Persistent atrial fibrillation Pt remains in NSR on Tikosyn 125 mcg BID  Continue Eliquis Electrolytes stable.  CHA2DS2VASC is at least 4 QTc stable this am. ECG not performed last night. Discussed with staff.   2. ETOH abuse Encouraged cessation  3. Disposition Admitted  from rehab with a fall in the setting of tachycardia after not getting his tikosyn.  PT eval still pending.  Re-ordered today, as potential discharge tomorrow.   For questions or updates, please contact [place] Please consult [email or web address] for contact info under Cardiology/STEMI.  Signed, Shirley Friar, PA-C  02/26/2020, 2:04 PM    I have seen, examined the patient, and reviewed the above assessment and plan.  Changes to above are made where necessary.  On exam, RRR.  Maintaining sinus rhythm Continue to follow   Co Sign: [name], MD 02/26/2020 10:30 PM

## 2020-02-26 NOTE — Evaluation (Signed)
Physical Therapy Evaluation Patient Details Name: Adam [name] MRN: [id number] DOB: March 06, 1951 Today's Date: 02/26/2020   History of Present Illness  69 y.o. male with medical history significant of diabetes, coronary artery disease status post coronary artery bypass grafting, atrial fibrillation not on any anticoagulation due to fear of bleed, asthma and COPD who was brought in from skilled facility was A. fib with RVR.and admitted 02/20/20  Clinical Impression  PTA pt rehabbing at [facility] from prior hospitalization where he was reported to have fallen day before current admission. Pt unable to provide clear picture of PLOF. Pt limited in safe mobility by significant increase in HR with simple bed mobility, in addition to decreased cognition and overall weakness and lack of balance. Pt requires maxA to come to sitting EoB where HR increased to 145 bpm and he became dizzy and diaphoretic requiring maxA to return to bed. PT recommending return to SNF at discharge to continue rehab. PT will continue to follow acutely.     Follow Up Recommendations SNF    Equipment Recommendations  Other (comment)(TBD at next venue)    Recommendations for Other Services OT consult     Precautions / Restrictions Precautions Precautions: Fall Precaution Comments: fell at facility day prior to admission  Restrictions Weight Bearing Restrictions: No      Mobility  Bed Mobility Overal bed mobility: Needs Assistance Bed Mobility: Supine to Sit     Supine to sit: Max assist Sit to supine: Max assist   General bed mobility comments: maxA for coming to EoB, difficulty with sequencing steps for bringing trunk to upright despite cuing, pt with dizziness and increased HR in sitting requiring max A to return to supine.                 Balance Overall balance assessment: Needs assistance Sitting-balance support: Feet supported;Single extremity supported Sitting balance-Leahy Scale: Fair                                        Pertinent Vitals/Pain Pain Assessment: No/denies pain    Home Living Family/patient expects to be discharged to:: Skilled nursing facility                      Prior Function Level of Independence: Needs assistance   Gait / Transfers Assistance Needed: pt reports working with therapy but unable to elaborate on level of assist  ADL's / Homemaking Assistance Needed: requires assist for ADLs and iADLs           Extremity/Trunk Assessment   Upper Extremity Assessment Upper Extremity Assessment: Difficult to assess due to impaired cognition    Lower Extremity Assessment Lower Extremity Assessment: Difficult to assess due to impaired cognition(pt with increase HR while attempting formal assessment)    Cervical / Trunk Assessment Cervical / Trunk Assessment: Kyphotic  Communication   Communication: No difficulties  Cognition Arousal/Alertness: Awake/alert Behavior During Therapy: WFL for tasks assessed/performed Overall Cognitive Status: History of cognitive impairments - at baseline                   Orientation Level: Disoriented to;Time Current Attention Level: Selective Memory: Decreased short-term memory Following Commands: Follows one step commands inconsistently;Follows multi-step commands with increased time Safety/Judgement: Decreased awareness of deficits Awareness: Emergent Problem Solving: Slow processing;Decreased initiation;Difficulty sequencing;Requires verbal cues General Comments: requires constant cuing for mobility  General Comments General comments (skin integrity, edema, etc.): in supine pt with HR 90 bpm and BP 128/80, with sittng EoB HR increased to 145 bpm, pt became dizzy and diaphoretic requesting return to supine. in supine BP 120/93. RN notified         Assessment/Plan    PT Assessment Patient needs continued PT services  PT Problem List Decreased strength;Decreased  activity tolerance;Decreased balance;Decreased mobility;Decreased coordination;Decreased cognition;Decreased safety awareness;Cardiopulmonary status limiting activity;Pain       PT Treatment Interventions DME instruction;Gait training;Functional mobility training;Therapeutic activities;Therapeutic exercise;Balance training;Cognitive remediation;Patient/family education    PT Goals (Current goals can be found in the Care Plan section)  Acute Rehab PT Goals Patient Stated Goal: not stated PT Goal Formulation: With patient Time For Goal Achievement: 03/11/20 Potential to Achieve Goals: Fair    Frequency Min 2X/week    AM-PAC PT "6 Clicks" Mobility  Outcome Measure Help needed turning from your back to your side while in a flat bed without using bedrails?: A Lot Help needed moving from lying on your back to sitting on the side of a flat bed without using bedrails?: Total Help needed moving to and from a bed to a chair (including a wheelchair)?: Total Help needed standing up from a chair using your arms (e.g., wheelchair or bedside chair)?: Total Help needed to walk in hospital room?: Total Help needed climbing 3-5 steps with a railing? : Total 6 Click Score: 7    End of Session   Activity Tolerance: Treatment limited secondary to medical complications (Comment)(increased HR) Patient left: in bed;with call bell/phone within reach;with bed alarm set Nurse Communication: Mobility status;Other (comment)(increased HR in sitting) PT Visit Diagnosis: Unsteadiness on feet (R26.81);Other abnormalities of gait and mobility (R26.89);Repeated falls (R29.6);Muscle weakness (generalized) (M62.81);Difficulty in walking, not elsewhere classified (R26.2);Dizziness and giddiness (R42)    Time: [id number] PT Time Calculation (min) (ACUTE ONLY): 20 min   Charges:   PT Evaluation $PT Eval Moderate Complexity: 1 Mod          Trishia Cuthrell B. Migdalia Dk PT, DPT Acute Rehabilitation Services Pager [phone] Office [phone]   [place] 02/26/2020, 6:05 PM

## 2020-02-26 NOTE — Progress Notes (Signed)
PT reported that when pt sat the side of the bed, his HR went up to 145 and he became diaphoretic.  His BP remained stable.  Pt is now lying down in bed in ST in 100s irregular rythms.  Idolina Primer, RN

## 2020-02-26 NOTE — Progress Notes (Signed)
PROGRESS NOTE  Adam [name] [id number] DOB: 20-Apr-1951 DOA: 02/20/2020 PCP: [name], MD  Brief History   The patient has been admitted to a telemetry bed. He has been restarted on his Tikosyn and continued on his diltiazem drip. He has not been well controlled this afternoon and has required an increase in the rate of the drip and a 5 mg bolus.  Repeat EKG this morning demonstrated continued prolongation of the QTc. Cardiology was consulted and [name] has been stopped. EP cardiology was consulted and they have tried to convert him to oral cardizem without success. He is now back on a higher dose of IV cardizem. Cardiology has now decided to leave the patient on IV Cardizem over the weekend with a plan to do TEE and DCCV on Monday followed by reloading to Tikosyn. However, this morning's EKG demonstrated that the patient had converted to sinus rhythm. No TEE or DCCV will be necessary per cardiology. Consultants  . Cardiology . Electrophysiology  Procedures  . None  Antibiotics   Anti-infectives (From admission, onward)   None     Subjective  The patient is resting comfortably. No new complaints.  Objective   Vitals:  Vitals:   02/26/20 0902 02/26/20 1001  BP:  137/84  Pulse:    Resp:    Temp:    SpO2: 99%    Exam:  Constitutional:  . The patient is confused. No acute distress. Respiratory:  . No increased work of breathing. . No wheezes, rales, or rhonchi . No tactile fremitus Cardiovascular:  . Irregular rate and rhythm . No murmurs, ectopy, or gallups. . No lateral PMI. No thrills. Abdomen:  . Abdomen is soft, non-tender, non-distended . No hernias, masses, or organomegaly . Normoactive bowel sounds.  Musculoskeletal:  . No cyanosis, clubbing, or edema Skin:  . No rashes, lesions, ulcers . palpation of skin: no induration or nodules Neurologic:  . CN 2-12 intact . Sensation all 4 extremities intact Psychiatric:  . Mental status o Mood,  affect appropriate o Orientation to person, place, time  . judgment and insight appear intact I have personally reviewed the following:   Today's Data  . Vitals, BMP  Scheduled Meds: . allopurinol  300 mg Oral Daily  . apixaban  5 mg Oral BID  . aspirin  81 mg Oral Daily  . diltiazem  180 mg Oral Daily  . dofetilide  125 mcg Oral BID  . DULoxetine  60 mg Oral Daily  . fenofibrate  160 mg Oral QAC breakfast  . fluticasone furoate-vilanterol  1 puff Inhalation Daily   And  . umeclidinium bromide  1 puff Inhalation Daily  . icosapent Ethyl  2 g Oral BID  . insulin glargine  18 Units Subcutaneous QHS  . linagliptin  5 mg Oral Daily  . losartan  100 mg Oral Daily  . magnesium chloride  1 tablet Oral Daily  . montelukast  10 mg Oral QHS  . pantoprazole  40 mg Oral Daily  . potassium chloride  40 mEq Oral Daily   And  . potassium chloride  20 mEq Oral QAC supper  . QUEtiapine  25 mg Oral BID  . rosuvastatin  40 mg Oral Daily  . sodium chloride flush  3 mL Intravenous Q12H  . thiamine  100 mg Oral Daily   Continuous Infusions: . sodium chloride    . sodium chloride      Principal Problem:   Atrial fibrillation with RVR (HCC) Active Problems:  CAD (coronary artery disease)   Hypertension associated with diabetes ([place])   Hyperlipidemia associated with type 2 diabetes mellitus (HCC)   GERD (gastroesophageal reflux disease)   COPD (chronic obstructive pulmonary disease) (HCC)   Type 2 diabetes mellitus, controlled (HCC)   Sleep apnea   Rapid atrial fibrillation (HCC)   Pressure injury of skin   LOS: 6 days   A & P  A. fib with RVR: Tykosin has been discontinued this am due to lack of response to therapy and prolonged Qtc. Cardiology was consulted as well as EP. His loop recorder was interrogated. It demonstrated that the patient has been going in and out of atrial fibrillation over the past 6 week. They attempted to convert the patient to oral cardizem. The patient's heart  rate was elevated. They put him back on a higher dose of cardizem IV. Cardiology has now decided to leave the patient on IV Cardizem over the weekend with a plan to do TEE and DCCV on Monday followed by reloading to Tikosyn. However, this morning's EKG demonstrated that the patient had converted to sinus rhythm. No TEE or DCCV will be necessary. He has been started on Cardizem CD 180 mg daily and returned to [place]. He remains in NSR this morning. Continue to monitor.   Hypertension: Continue home regimen.   Hyperlipidemia: Continue statin.  Hypomagnesemia: Supplemented. Monitor.   GERD: Continue PPIs  Pressure Ulcer: Medial Sacrum Stage II.Wound care consulted.   Obstructive sleep apnea: CPAP at night as tolerated   Diabetes: Continue home regimen with sliding scale insulin. FSBS has been 145 - 185.   Coronary artery disease: Stable.  Continue to monitor on telemetry   I have seen and examined this patient myself. I have spent 34 minutes in his evaluation and care.  DVT prophylaxis: SCD's CODE STATUS: DNR Family Communication:None available. Disposition: The patient is from home. I anticipate discharge to home. Barriers to discharge include control of rate on oral medications, and cardiac clearance for discharge. The patient is not cleared for discharge.  Adam Dworkin, DO [facility] Direct contact: see [email or web address]  7PM-7AM contact night coverage as above 02/26/2020, 5:14 PM  LOS: 1 day

## 2020-02-27 ENCOUNTER — Encounter (HOSPITAL_COMMUNITY): Payer: Self-pay | Admitting: Internal Medicine

## 2020-02-27 DIAGNOSIS — E1159 Type 2 diabetes mellitus with other circulatory complications: Secondary | ICD-10-CM

## 2020-02-27 DIAGNOSIS — Z66 Do not resuscitate: Secondary | ICD-10-CM

## 2020-02-27 DIAGNOSIS — L8990 Pressure ulcer of unspecified site, unspecified stage: Secondary | ICD-10-CM

## 2020-02-27 DIAGNOSIS — G4733 Obstructive sleep apnea (adult) (pediatric): Secondary | ICD-10-CM

## 2020-02-27 DIAGNOSIS — I10 Essential (primary) hypertension: Secondary | ICD-10-CM

## 2020-02-27 DIAGNOSIS — J441 Chronic obstructive pulmonary disease with (acute) exacerbation: Secondary | ICD-10-CM

## 2020-02-27 DIAGNOSIS — I2510 Atherosclerotic heart disease of native coronary artery without angina pectoris: Secondary | ICD-10-CM

## 2020-02-27 DIAGNOSIS — E1165 Type 2 diabetes mellitus with hyperglycemia: Secondary | ICD-10-CM

## 2020-02-27 LAB — GLUCOSE, CAPILLARY: Glucose-Capillary: 150 mg/dL — ABNORMAL HIGH (ref 70–99)

## 2020-02-27 LAB — BASIC METABOLIC PANEL
Anion gap: 9 (ref 5–15)
BUN: 6 mg/dL — ABNORMAL LOW (ref 8–23)
CO2: 23 mmol/L (ref 22–32)
Calcium: 10.3 mg/dL (ref 8.9–10.3)
Chloride: 105 mmol/L (ref 98–111)
Creatinine, Ser: 0.84 mg/dL (ref 0.61–1.24)
GFR calc Af Amer: >60 mL/min (ref >60)
GFR calc non Af Amer: >60 mL/min (ref >60)
Glucose, Bld: 149 mg/dL — ABNORMAL HIGH (ref 70–99)
Potassium: 4 mmol/L (ref 3.5–5.1)
Sodium: 137 mmol/L (ref 135–145)

## 2020-02-27 LAB — SARS CORONAVIRUS 2 (TAT 6-24 HRS): SARS Coronavirus 2: NEGATIVE

## 2020-02-27 LAB — MAGNESIUM: Magnesium: 1.9 mg/dL (ref 1.7–2.4)

## 2020-02-27 MED ORDER — ENSURE ENLIVE PO LIQD: 237.0000 mL | Freq: Two times a day (BID) | ORAL | Status: DC

## 2020-02-27 MED ORDER — MAGNESIUM SULFATE 2 GM/50ML IV SOLN
2.0000 g | Freq: Once | INTRAVENOUS | Status: AC
  Administered 2020-02-27: 2 g via INTRAVENOUS
  Filled 2020-02-27: qty 50

## 2020-02-27 NOTE — Progress Notes (Addendum)
Morning EKG reviewed  Shows remains in NSR at 95 bpm with stable QTc at ~470 ms when measured manually  Continue Tikosyn 125 mcg BID.   Pt has resumed tikosyn without issue. Can titrate rate control as needed. Will make follow up with AF clinic for 6/14.  EP will follow as needed while here.   Shirley Friar, PA-C  Pager: [phone]  02/27/2020 1:57 PM

## 2020-02-27 NOTE — NC FL2 (Signed)
[place] MEDICAID FL2 LEVEL OF CARE SCREENING TOOL     IDENTIFICATION  Patient Name: Adam [name] Birthdate: 07-May-1951 Sex: male Admission Date (Current Location): 02/20/2020  [facility] and [place] Number:  Herbalist and Address:  [facility] [place] [place] [address], [place], [place] [address]      Provider Number: [id number]  Attending Physician Name and Address:  Mercy Riding, MD  Relative Name and Phone Number:  [name] [phone]    Current Level of Care: Hospital Recommended Level of Care: [place] Prior Approval Number:    Date Approved/Denied: 02/20/20 PASRR Number: [id number]  Discharge Plan: SNF    Current Diagnoses: Patient Active Problem List   Diagnosis Date Noted  . Pressure injury of skin 02/21/2020  . Rapid atrial fibrillation ([place]) 02/20/2020  . Atrial fibrillation with RVR ([place]) 02/08/2020  . High anion gap metabolic acidosis 91/63/8466  . Secondary hypercoagulable state ([place]) 11/02/2019  . Alcohol withdrawal ([place]) 10/24/2019  . Paroxysmal atrial fibrillation ([place]) 10/24/2019  . Rectal bleeding 10/24/2019  . Thoracic aortic aneurysm ([place]) 02/08/2019  . Hemoptysis 10/06/2018  . S/P right THA, 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  . S/P hip replacement 09/12/2018  . Sleep apnea 01/09/2018  . Persistent atrial fibrillation 12/12/2017  . Allergic rhinitis 08/22/2015  . Family history of colon cancer in mother deceased age 21 2015-07-16  . Type 2 diabetes mellitus, controlled ([place]) 07/17/2013  . COPD (chronic obstructive pulmonary disease) ([place]) 07/05/2013  . CAD (coronary artery disease) 06/26/2013  . Hypertension associated with diabetes ([place]) 06/26/2013  . Hyperlipidemia associated with type 2 diabetes mellitus ([place]) 06/26/2013  . GERD (gastroesophageal reflux disease) 06/26/2013  . History of atrial fibrillation without current medication 06/26/2013  . Gout 06/26/2013  . Osteoarthritis 06/26/2013  . Asthma, mild  persistent 06/26/2013  . Alcohol dependence ([place]) 06/26/2013  . PTSD (post-traumatic stress disorder) 06/26/2013  . HLA B27 (HLA B27 positive) 06/26/2013  . Obesity (BMI 30-39.9) 06/26/2013    Orientation RESPIRATION BLADDER Height & Weight     Self, Situation, Place  Normal Incontinent, External catheter Weight: 214 lb 8.1 oz (97.3 kg) Height:     BEHAVIORAL SYMPTOMS/MOOD NEUROLOGICAL BOWEL NUTRITION STATUS      Incontinent Diet(See Discharge Summary)  AMBULATORY STATUS COMMUNICATION OF NEEDS Skin   Extensive Assist Verbally Skin abrasions, Other (Comment)(Approp for ethnicity,dry,ecchymosis arm back flank right, eczema back posterior, moisture buttocks scrotum bilateral non-tenting,abrasion arm leg right posterior pressure injury sacrum medial stage 2)                       Personal Care Assistance Level of Assistance  Bathing, Feeding, Dressing Bathing Assistance: Maximum assistance Feeding assistance: (able to feed self-carb modified,cardiac) Dressing Assistance: Maximum assistance     Functional Limitations Info  Sight, Hearing, Speech Sight Info: Impaired Hearing Info: Adequate Speech Info: Adequate    SPECIAL CARE FACTORS FREQUENCY  PT (By licensed PT), OT (By licensed OT)     PT Frequency: 5x min weekly OT Frequency: 5x min weekly            Contractures Contractures Info: Not present    Additional Factors Info  Code Status, Allergies Code Status Info: DNR Allergies Info: Amoxicillin,Augmentin amoxicillin-pot Clavulanate,Azithromycin,Clindamycin/lincomycin,Keflex cephalexin           Current Medications (02/27/2020):  This is the current hospital active medication list Current Facility-Administered Medications  Medication Dose Route Frequency Provider Last Rate Last Admin  . 0.9 %  sodium chloride infusion   Intravenous Continuous Kroeger, Krista M., PA-C      . 0.9 %  sodium chloride infusion  250 mL Intravenous PRN Shirley Friar, PA-C  10 mL/hr at 02/27/20 0753 250 mL at 02/27/20 0753  . acetaminophen (TYLENOL) tablet 650 mg  650 mg Oral Q4H PRN Gala Romney L, MD      . allopurinol (ZYLOPRIM) tablet 300 mg  300 mg Oral Daily Gala Romney L, MD   300 mg at 02/26/20 1001  . apixaban (ELIQUIS) tablet 5 mg  5 mg Oral BID Constance Haw, MD   5 mg at 02/26/20 2150  . aspirin chewable tablet 81 mg  81 mg Oral Daily Gala Romney L, MD   81 mg at 02/26/20 1002  . diltiazem (CARDIZEM CD) 24 hr capsule 180 mg  180 mg Oral Daily Shirley Friar, PA-C   180 mg at 02/26/20 1001  . dofetilide (TIKOSYN) capsule 125 mcg  125 mcg Oral BID Shirley Friar, PA-C   125 mcg at 02/27/20 0827  . DULoxetine (CYMBALTA) DR capsule 60 mg  60 mg Oral Daily Gala Romney L, MD   60 mg at 02/26/20 1002  . fenofibrate tablet 160 mg  160 mg Oral QAC breakfast Elwyn Reach, MD   160 mg at 02/27/20 0827  . fluticasone (FLONASE) 50 MCG/ACT nasal spray 1 spray  1 spray Each Nare Daily PRN Garba, Mohammad L, MD      . fluticasone furoate-vilanterol (BREO ELLIPTA) 100-25 MCG/INH 1 puff  1 puff Inhalation Daily Gala Romney L, MD   1 puff at 02/27/20 0913   And  . umeclidinium bromide (INCRUSE ELLIPTA) 62.5 MCG/INH 1 puff  1 puff Inhalation Daily Elwyn Reach, MD   1 puff at 02/27/20 0914  . gabapentin (NEURONTIN) capsule 300 mg  300 mg Oral Daily PRN Elwyn Reach, MD   300 mg at 02/21/20 2217  . hydrocortisone (ANUSOL-HC) 2.5 % rectal cream 1 application  1 application Rectal QHS PRN Elwyn Reach, MD      . icosapent Ethyl (VASCEPA) 1 g capsule 2 g  2 g Oral BID Elwyn Reach, MD   2 g at 02/26/20 2151  . insulin glargine (LANTUS) injection 18 Units  18 Units Subcutaneous QHS Elwyn Reach, MD   18 Units at 02/26/20 2201  . linagliptin (TRADJENTA) tablet 5 mg  5 mg Oral Daily Gala Romney L, MD   5 mg at 02/26/20 1001  . losartan (COZAAR) tablet 100 mg  100 mg Oral Daily Gala Romney L, MD   100  mg at 02/26/20 1002  . magnesium chloride (SLOW-MAG) 64 MG SR tablet 64 mg  1 tablet Oral Daily Gala Romney L, MD   64 mg at 02/26/20 1004  . montelukast (SINGULAIR) tablet 10 mg  10 mg Oral QHS Elwyn Reach, MD   10 mg at 02/26/20 2152  . ondansetron (ZOFRAN) injection 4 mg  4 mg Intravenous Q6H PRN Gala Romney L, MD      . pantoprazole (PROTONIX) EC tablet 40 mg  40 mg Oral Daily Gala Romney L, MD   40 mg at 02/26/20 1001  . potassium chloride SA (KLOR-CON) CR tablet 40 mEq  40 mEq Oral Daily Gala Romney L, MD   40 mEq at 02/26/20 1002   And  . potassium chloride SA (KLOR-CON) CR tablet 20 mEq  20 mEq Oral QAC supper Elwyn Reach, MD  20 mEq at 02/26/20 1743  . QUEtiapine (SEROQUEL) tablet 25 mg  25 mg Oral BID Swayze, Ava, DO   25 mg at 02/26/20 2152  . rosuvastatin (CRESTOR) tablet 40 mg  40 mg Oral Daily Gala Romney L, MD   40 mg at 02/26/20 1003  . sodium chloride flush (NS) 0.9 % injection 3 mL  3 mL Intravenous Q12H Shirley Friar, PA-C   3 mL at 02/26/20 2152  . sodium chloride flush (NS) 0.9 % injection 3 mL  3 mL Intravenous PRN Shirley Friar, PA-C      . thiamine tablet 100 mg  100 mg Oral Daily Elwyn Reach, MD   100 mg at 02/26/20 1001     Discharge Medications: Please see discharge summary for a list of discharge medications.  Relevant Imaging Results:  Relevant Lab Results:   Additional Information [phone]  Trula Ore, LCSWA

## 2020-02-27 NOTE — TOC Initial Note (Addendum)
Transition of Care [facility]) - Initial/Assessment Note    Patient Details  Name: Adam [name] MRN: [id number] Date of Birth: [birth year]  [facility] CM/SW Contact:    Trula Ore, [place] Phone Number: 02/27/2020, 10:05 AM  Clinical Narrative:                  CSW spoke with [facility]. Plan is for patient to go back to [facility]. CSW started insurance authorization it is Reference number [id number].  Patient has bed at [facility]. Insurance authorization pending.  CSW will continue to follow.  Expected Discharge Plan: Skilled Nursing Facility Barriers to Discharge: No Barriers Identified   Patient Goals and CMS Choice Patient states their goals for this hospitalization and ongoing recovery are:: to go to skilled nursing facility      Expected Discharge Plan and Services Expected Discharge Plan: [place]                                              Prior Living Arrangements/Services     Patient language and need for interpreter reviewed:: Yes Do you feel safe going back to the place where you live?: No   SNF  Need for Family Participation in Patient Care: Yes (Comment) Care giver support system in place?: Yes (comment)   Criminal Activity/Legal Involvement Pertinent to Current Situation/Hospitalization: No - Comment as needed  Activities of Daily Living      Permission Sought/Granted Permission sought to share information with : Case Manager, Customer service manager, Family Supports       Permission granted to share info w AGENCY: SNF        Emotional Assessment       Orientation: : Oriented to Self, Oriented to Place, Oriented to Situation Alcohol / Substance Use: Not Applicable Psych Involvement: No (comment)  Admission diagnosis:  Paroxysmal atrial fibrillation (HCC) [I48.0] Rapid atrial fibrillation (HCC) [I48.91] Atrial fibrillation with RVR ([place]) [I48.91] Patient Active Problem List   Diagnosis Date Noted  . Pressure injury of skin 02/21/2020  . Rapid atrial fibrillation ([place]) 02/20/2020  . Atrial fibrillation with RVR ([place]) 02/08/2020  . High anion gap metabolic acidosis 62/95/2841  . Secondary hypercoagulable state ([place]) 11/02/2019  . Alcohol withdrawal ([place]) 10/24/2019  . Paroxysmal atrial fibrillation ([place]) 10/24/2019  . Rectal bleeding 10/24/2019  . Thoracic aortic aneurysm ([place]) 02/08/2019  . Hemoptysis 10/06/2018  . S/P right THA, 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  . S/P hip replacement 09/12/2018  . Sleep apnea 01/09/2018  . Persistent atrial fibrillation 12/12/2017  . Allergic rhinitis 08/22/2015  . Family history of colon cancer in mother deceased age 70 2015-08-04  . Type 2 diabetes mellitus, controlled ([place]) 07/17/2013  . COPD (chronic obstructive pulmonary disease) ([place]) 07/05/2013  . CAD (coronary artery disease) 06/26/2013  . Hypertension associated with diabetes ([place]) 06/26/2013  . Hyperlipidemia associated with type 2 diabetes mellitus ([place]) 06/26/2013  . GERD (gastroesophageal reflux disease) 06/26/2013  . History of atrial fibrillation without current medication 06/26/2013  . Gout 06/26/2013  . Osteoarthritis 06/26/2013  . Asthma, mild persistent 06/26/2013  . Alcohol dependence ([place]) 06/26/2013  . PTSD (post-traumatic stress disorder) 06/26/2013  . HLA B27 (HLA B27 positive) 06/26/2013  . Obesity (BMI 30-39.9) 06/26/2013   PCP:  [name], MD Pharmacy:   [facility] [phone] - [place], [place] - [address] [place] [address] Phone: [phone] Fax: [phone]  [facility] [place], [place] - [address] [place] [address] Phone: [phone] Fax: [phone]     Social Determinants of Health (SDOH) Interventions    Readmission Risk Interventions No flowsheet data found.

## 2020-02-27 NOTE — Progress Notes (Signed)
PROGRESS NOTE  Adam [name] [id number] DOB: 05-17-1951   PCP: [name], MD  Patient is from: Home  DOA: 02/20/2020 LOS: 7  Brief Narrative / Interim history: 69 year old male with history of DM-2, CAD, A. Fib s/p ablation and DCCV, COPD, HTN, HLD, OSA not on CPAP, anxiety and depression admitted for atrial fibrillation with RVR.  Has been on Tikosyn and Cardizem drip per cardiology.  However, he was noted to have gradually progressive prolonged QTc.  Tikosyn was put on hold.  Initially, there was plan for TEE and DCCV early next week.  However, given improvement in his heart rate he was eventually transitioned to p.o. Cardizem CD.  Tikosyn resumed as well.  QTc remained stable at about 470.    Therapy recommended SNF.  Subjective: Seen and examined earlier this morning.  No major events overnight of this morning.  Heart rate in upper 90s to low 100.  Patient is asymptomatic.  He denies chest pain, dyspnea, palpitation, GI or UTI symptoms.  Objective: Vitals:   02/27/20 0457 02/27/20 0700 02/27/20 0800 02/27/20 1612  BP: (!) 147/88 132/84 132/84 129/75  Pulse: 94 93 91   Resp: 20 16 16 20   Temp: 98.3 F (36.8 C) 98.2 F (36.8 C)  97.9 F (36.6 C)  TempSrc: Oral Oral  Oral  SpO2: 97% 97% 96% 98%  Weight: 97.3 kg       Intake/Output Summary (Last 24 hours) at 02/27/2020 1730 Last data filed at 02/27/2020 1500 Gross per 24 hour  Intake 249.29 ml  Output 400 ml  Net -150.71 ml   Filed Weights   02/25/20 0248 02/26/20 0132 02/27/20 0457  Weight: 99 kg 105 kg 97.3 kg    Examination:  GENERAL: No apparent distress.  Nontoxic. HEENT: MMM.  Vision and hearing grossly intact.  NECK: Supple.  No apparent JVD.  RESP:  No IWOB.  Fair aeration bilaterally. CVS: RRR. Heart sounds normal.  ABD/GI/GU: BS+. Abd soft, NTND.  MSK/EXT:  Moves extremities. No apparent deformity. No edema.  SKIN: no apparent skin lesion or wound NEURO: Awake, alert and oriented  appropriately.  No apparent focal neuro deficit. PSYCH: Calm. Normal affect.  Procedures:  None  Microbiology summarized: COVID-19 PCR negative. Blood culture negative (only 1 bottle).  Assessment & Plan: A. fib with RVR: Rate improved, 90s to 100.  Had ablation and DCCV in the past. -Continue p.o. Tikosyn and p.o. Cardizem CD per cardiology.  -No plan for TEE/DCCV at this point -On Eliquis for anticoagulation.  Prolonged QTc: was 485 on EKG but adjusts to 470s for tachycardia -Optimize electrolytes -Appreciate EP insight and guidance  DM-2 with hyperglycemia and hyperlipidemia Recent Labs    02/25/20 0757 02/25/20 1122 02/26/20 2200  GLUCAP 145* 185* 153*  -Continue current regimen with statin   History of CAD: No anginal symptoms. -Continue home medications  Essential hypertension: Normotensive -Continue Cardizem  Chronic COPD -Continue LABA/LAMA/ICS with as needed nebulizers  OSA: -Continue nightly CPAP          Pressure Injury 02/20/20 Sacrum Medial Stage 2 -  Partial thickness loss of dermis presenting as a shallow open injury with a red, pink wound bed without slough. (Active)  02/20/20 2249  Location: Sacrum  Location Orientation: Medial  Staging: Stage 2 -  Partial thickness loss of dermis presenting as a shallow open injury with a red, pink wound bed without slough.  Wound Description (Comments):   Present on Admission: Yes   DVT prophylaxis: On Eliquis  for A. fib Code Status: DNR/DNI Family Communication: Patient and/or RN. Available if any question.  Status is: Inpatient  Remains inpatient appropriate because:Inpatient level of care appropriate due to severity of illness   Dispo: The patient is from: Home              Anticipated d/c is to: SNF              Anticipated d/c date is: 1 day              Patient currently is not medically stable to d/c.        Consultants:  Electrophysiology   Sch Meds:  Scheduled Meds: .  allopurinol  300 mg Oral Daily  . apixaban  5 mg Oral BID  . aspirin  81 mg Oral Daily  . diltiazem  180 mg Oral Daily  . dofetilide  125 mcg Oral BID  . DULoxetine  60 mg Oral Daily  . fenofibrate  160 mg Oral QAC breakfast  . fluticasone furoate-vilanterol  1 puff Inhalation Daily   And  . umeclidinium bromide  1 puff Inhalation Daily  . icosapent Ethyl  2 g Oral BID  . insulin glargine  18 Units Subcutaneous QHS  . linagliptin  5 mg Oral Daily  . losartan  100 mg Oral Daily  . magnesium chloride  1 tablet Oral Daily  . montelukast  10 mg Oral QHS  . pantoprazole  40 mg Oral Daily  . potassium chloride  40 mEq Oral Daily   And  . potassium chloride  20 mEq Oral QAC supper  . QUEtiapine  25 mg Oral BID  . rosuvastatin  40 mg Oral Daily  . sodium chloride flush  3 mL Intravenous Q12H  . thiamine  100 mg Oral Daily   Continuous Infusions: . sodium chloride    . sodium chloride 250 mL (02/27/20 0753)   PRN Meds:.sodium chloride, acetaminophen, fluticasone, gabapentin, hydrocortisone, ondansetron (ZOFRAN) IV, sodium chloride flush  Antimicrobials: Anti-infectives (From admission, onward)   None       I have personally reviewed the following labs and images: CBC: Recent Labs  Lab 02/20/20 1934 02/21/20 0334  WBC 8.9 8.1  HGB 11.8* 11.0*  HCT 38.0* 35.1*  MCV 96.9 98.0  PLT 322 309   BMP &GFR Recent Labs  Lab 02/23/20 0422 02/23/20 0422 02/24/20 0252 02/25/20 0033 02/25/20 0830 02/26/20 0430 02/27/20 0453  NA 139  --   --  139 136 138 137  K 3.9  --   --  4.6 3.8 4.7 4.0  CL 107  --   --  108 106 106 105  CO2 24  --   --  22 21* 21* 23  GLUCOSE 151*  --   --  164* 152* 147* 149*  BUN 7*  --   --  5* 6* 6* 6*  CREATININE 0.88  --   --  0.79 0.79 0.84 0.84  CALCIUM 9.9  --   --  9.9 10.1 9.8 10.3  MG 1.9   < > 1.7 1.9 1.7 2.0 1.9   < > = values in this interval not displayed.   Estimated Creatinine Clearance: 97.1 mL/min (by C-G formula based on SCr of  0.84 mg/dL). Liver & Pancreas: No results for input(s): AST, ALT, ALKPHOS, BILITOT, PROT, ALBUMIN in the last 168 hours. No results for input(s): LIPASE, AMYLASE in the last 168 hours. No results for input(s): AMMONIA in the last 168 hours. Diabetic:  No results for input(s): HGBA1C in the last 72 hours. Recent Labs  Lab 02/24/20 1700 02/24/20 2051 02/25/20 0757 02/25/20 1122 02/26/20 2200  GLUCAP 149* 129* 145* 185* 153*   Cardiac Enzymes: No results for input(s): CKTOTAL, CKMB, CKMBINDEX, TROPONINI in the last 168 hours. Recent Labs    12/03/19 1502  PROBNP 266.0*   Coagulation Profile: Recent Labs  Lab 02/25/20 0033  INR 1.3*   Thyroid Function Tests: No results for input(s): TSH, T4TOTAL, FREET4, T3FREE, THYROIDAB in the last 72 hours. Lipid Profile: No results for input(s): CHOL, HDL, LDLCALC, TRIG, CHOLHDL, LDLDIRECT in the last 72 hours. Anemia Panel: No results for input(s): VITAMINB12, FOLATE, FERRITIN, TIBC, IRON, RETICCTPCT in the last 72 hours. Urine analysis:    Component Value Date/Time   COLORURINE AMBER (A) 02/20/2020 1957   APPEARANCEUR HAZY (A) 02/20/2020 1957   LABSPEC 1.020 02/20/2020 1957   PHURINE 5.0 02/20/2020 1957   GLUCOSEU 50 (A) 02/20/2020 1957   HGBUR NEGATIVE 02/20/2020 1957   BILIRUBINUR NEGATIVE 02/20/2020 1957   BILIRUBINUR 1+ 08/14/2013 1200   KETONESUR NEGATIVE 02/20/2020 1957   PROTEINUR 30 (A) 02/20/2020 1957   UROBILINOGEN 0.2 08/14/2013 1200   NITRITE NEGATIVE 02/20/2020 1957   LEUKOCYTESUR TRACE (A) 02/20/2020 1957   Sepsis Labs: Invalid input(s): PROCALCITONIN, [place]  Microbiology: Recent Results (from the past 240 hour(s))  SARS Coronavirus 2 by RT PCR (hospital order, performed in [facility] hospital lab) Nasopharyngeal Nasopharyngeal Swab     Status: None   Collection Time: 02/20/20  6:38 PM   Specimen: Nasopharyngeal Swab  Result Value Ref Range Status   SARS Coronavirus 2 NEGATIVE NEGATIVE Final     Comment: (NOTE) SARS-CoV-2 target nucleic acids are NOT DETECTED. The SARS-CoV-2 RNA is generally detectable in upper and lower respiratory specimens during the acute phase of infection. The lowest concentration of SARS-CoV-2 viral copies this assay can detect is 250 copies / mL. A negative result does not preclude SARS-CoV-2 infection and should not be used as the sole basis for treatment or other patient management decisions.  A negative result may occur with improper specimen collection / handling, submission of specimen other than nasopharyngeal swab, presence of viral mutation(s) within the areas targeted by this assay, and inadequate number of viral copies (<250 copies / mL). A negative result must be combined with clinical observations, patient history, and epidemiological information. Fact Sheet for Patients:   StrictlyIdeas.no Fact Sheet for Healthcare Providers: BankingDealers.co.za This test is not yet approved or cleared  by the [place] FDA and has been authorized for detection and/or diagnosis of SARS-CoV-2 by FDA under an Emergency Use Authorization (EUA).  This EUA will remain in effect (meaning this test can be used) for the duration of the COVID-19 declaration under Section 564(b)(1) of the Act, 21 U.S.C. section 360bbb-3(b)(1), unless the authorization is terminated or revoked sooner. Performed at [facility], [place] [address] [place], [place] [address]   Culture, blood (single) w Reflex to ID Panel     Status: None   Collection Time: 02/20/20  7:34 PM   Specimen: BLOOD  Result Value Ref Range Status   Specimen Description BLOOD LEFT ANTECUBITAL  Final   Special Requests   Final    BOTTLES DRAWN AEROBIC AND ANAEROBIC Blood Culture adequate volume   Culture   Final    NO GROWTH 5 DAYS Performed at [facility], [place] [address] [place], [place] [address]    Report Status 02/25/2020 FINAL  Final    SARS CORONAVIRUS 2 (TAT 6-24 HRS) Nasopharyngeal Nasopharyngeal Swab     Status: None   Collection Time: 02/27/20 11:33 AM   Specimen: Nasopharyngeal Swab  Result Value Ref Range Status   SARS Coronavirus 2 NEGATIVE NEGATIVE Final    Comment: (NOTE) SARS-CoV-2 target nucleic acids are NOT DETECTED. The SARS-CoV-2 RNA is generally detectable in upper and lower respiratory specimens during the acute phase of infection. Negative results do not preclude SARS-CoV-2 infection, do not rule out co-infections with other pathogens, and should not be used as the sole basis for treatment or other patient management decisions. Negative results must be combined with clinical observations, patient history, and epidemiological information. The expected result is Negative. Fact Sheet for Patients: SugarRoll.be Fact Sheet for Healthcare Providers: https://www.woods-mathews.com/ This test is not yet approved or cleared by the [place] FDA and  has been authorized for detection and/or diagnosis of SARS-CoV-2 by FDA under an Emergency Use Authorization (EUA). This EUA will remain  in effect (meaning this test can be used) for the duration of the COVID-19 declaration under Section 56 4(b)(1) of the Act, 21 U.S.C. section 360bbb-3(b)(1), unless the authorization is terminated or revoked sooner. Performed at [facility], [place] [address], [place], [place] [address]     Radiology Studies: No results found.    Grainne Knights T. [place]  If 7PM-7AM, please contact night-coverage [email or web address] Password [facility] 02/27/2020, 5:30 PM

## 2020-02-27 NOTE — Progress Notes (Addendum)
Electrophysiology Rounding Note  Patient Name: Adam [name] Date of Encounter: 02/27/2020  Primary Cardiologist: [name], MD  Electrophysiologist: [name], MD    Subjective   Pt remains in NSR with frequent PACs on Tikosyn 125 mcg BID   QTc from EKG last pm shows stable QTc at ~470 when measured manually.  The patient is doing well today. He had elevated HRs in the setting of working with physical therapy yesterday/deconditioning.  Inpatient Medications    Scheduled Meds: . allopurinol  300 mg Oral Daily  . apixaban  5 mg Oral BID  . aspirin  81 mg Oral Daily  . diltiazem  180 mg Oral Daily  . dofetilide  125 mcg Oral BID  . DULoxetine  60 mg Oral Daily  . fenofibrate  160 mg Oral QAC breakfast  . fluticasone furoate-vilanterol  1 puff Inhalation Daily   And  . umeclidinium bromide  1 puff Inhalation Daily  . icosapent Ethyl  2 g Oral BID  . insulin glargine  18 Units Subcutaneous QHS  . linagliptin  5 mg Oral Daily  . losartan  100 mg Oral Daily  . magnesium chloride  1 tablet Oral Daily  . montelukast  10 mg Oral QHS  . pantoprazole  40 mg Oral Daily  . potassium chloride  40 mEq Oral Daily   And  . potassium chloride  20 mEq Oral QAC supper  . QUEtiapine  25 mg Oral BID  . rosuvastatin  40 mg Oral Daily  . sodium chloride flush  3 mL Intravenous Q12H  . thiamine  100 mg Oral Daily   Continuous Infusions: . sodium chloride    . sodium chloride 250 mL (02/27/20 0753)   PRN Meds: sodium chloride, acetaminophen, fluticasone, gabapentin, hydrocortisone, ondansetron (ZOFRAN) IV, sodium chloride flush   Vital Signs    Vitals:   02/26/20 1935 02/26/20 2022 02/27/20 0457 02/27/20 0700  BP:  111/65 (!) 147/88 132/84  Pulse:  (!) 105 94 93  Resp: 15 20 20 16   Temp:  97.8 F (36.6 C) 98.3 F (36.8 C) 98.2 F (36.8 C)  TempSrc:  Oral Oral Oral  SpO2:  95% 97% 97%  Weight:   97.3 kg     Intake/Output Summary (Last 24 hours) at 02/27/2020  1055 Last data filed at 02/27/2020 0500 Gross per 24 hour  Intake --  Output 700 ml  Net -700 ml   Filed Weights   02/25/20 0248 02/26/20 0132 02/27/20 0457  Weight: 99 kg 105 kg 97.3 kg    Physical Exam    GEN- The patient is well appearing, alert and oriented x 3 today.   Head- normocephalic, atraumatic Eyes-  Sclera clear, conjunctiva pink Ears- hearing intact Oropharynx- clear Neck- supple Lungs- Clear to ausculation bilaterally, normal work of breathing Heart- Slightly tachy rate with regular rhythm, no murmurs, rubs or gallops GI- soft, NT, ND, + BS Extremities- no clubbing, cyanosis, or edema Skin- no rash or lesion Psych- euthymic mood, full affect Neuro- strength and sensation are intact  Labs    CBC No results for input(s): WBC, NEUTROABS, HGB, HCT, MCV, PLT in the last 72 hours. Basic Metabolic Panel Recent Labs    02/26/20 0430 02/27/20 0453  NA 138 137  K 4.7 4.0  CL 106 105  CO2 21* 23  GLUCOSE 147* 149*  BUN 6* 6*  CREATININE 0.84 0.84  CALCIUM 9.8 10.3  MG 2.0 1.9    Potassium  Date/Time Value  Ref Range Status  02/27/2020 04:53 AM 4.0 3.5 - 5.1 mmol/L Final   Magnesium  Date/Time Value Ref Range Status  02/27/2020 04:53 AM 1.9 1.7 - 2.4 mg/dL Final    Comment:    Performed at [facility], [place] [address] [place], [place] [address]    Telemetry    NSR 90-sinus tach 100s. Rates into 130-140s yesterday afternoon while working with PT (personally reviewed)  Radiology    No results found.   Patient Profile     [name] is a 69 y.o. male with a past medical history significant for persistent atrial fibrillation.  They were admitted for tikosyn load.   Assessment & Plan    1. Persistent atrial fibrillation Pt remains in NSR with frequent PACs on Tikosyn 125 mcg BID  Continue Eliquis Electrolytes stable.  CHA2DS2VASC is at least 4  After this mornings EKG, can stop serial monitoring with 5 doses of tikosyn (and pt  previously on this chronic dose)  2. Deconditioning Pt working with PT and plan return to SNF HR slightly elevated at baseline currently in the setting of significant deconditioning.  Can increase diltiazem as needed.   3. ETOH abuse Have stressed importance of cessation and relation to AF burden/exacerbation.   EP to see as needed while here. Please call back with questions. If QTc stable on am EKG, will discontinue serial monitoring for this admission.   For questions or updates, please contact [place] Please consult [email or web address] for contact info under Cardiology/STEMI.  Signed, Shirley Friar, PA-C  02/27/2020, 10:55 AM    .I have seen, examined the patient, and reviewed the above assessment and plan.  Changes to above are made where necessary.  On exam, RRR.  He is now back on tikosyn with stable qt.  No further inpatient cardiology evaluation is planned.    Electrophysiology team to see as needed while here. Please call with questions.   Co Sign: [name], MD 02/27/2020 10:27 PM

## 2020-02-28 DIAGNOSIS — E43 Unspecified severe protein-calorie malnutrition: Secondary | ICD-10-CM

## 2020-02-28 LAB — BASIC METABOLIC PANEL
Anion gap: 9 (ref 5–15)
BUN: 7 mg/dL — ABNORMAL LOW (ref 8–23)
CO2: 21 mmol/L — ABNORMAL LOW (ref 22–32)
Calcium: 10.1 mg/dL (ref 8.9–10.3)
Chloride: 105 mmol/L (ref 98–111)
Creatinine, Ser: 0.77 mg/dL (ref 0.61–1.24)
GFR calc Af Amer: >60 mL/min (ref >60)
GFR calc non Af Amer: >60 mL/min (ref >60)
Glucose, Bld: 140 mg/dL — ABNORMAL HIGH (ref 70–99)
Potassium: 4.1 mmol/L (ref 3.5–5.1)
Sodium: 135 mmol/L (ref 135–145)

## 2020-02-28 LAB — MAGNESIUM: Magnesium: 1.9 mg/dL (ref 1.7–2.4)

## 2020-02-28 MED ORDER — POTASSIUM CHLORIDE CRYS ER 20 MEQ PO TBCR: EXTENDED_RELEASE_TABLET | ORAL | Status: DC

## 2020-02-28 MED ORDER — DILTIAZEM HCL ER COATED BEADS 240 MG PO CP24
240.0000 mg | ORAL_CAPSULE | Freq: Every day | ORAL | 11 refills | Status: DC
Start: 2020-02-28 — End: 2020-03-28

## 2020-02-28 MED ORDER — APIXABAN 5 MG PO TABS: 5.0000 mg | ORAL_TABLET | Freq: Two times a day (BID) | ORAL | 1 refills | Status: DC

## 2020-02-28 MED ORDER — DILTIAZEM HCL ER COATED BEADS 240 MG PO CP24
240.0000 mg | ORAL_CAPSULE | Freq: Every day | ORAL | Status: DC
  Administered 2020-02-29 – 2020-03-03 (×4): 240 mg via ORAL
  Filled 2020-02-28 (×4): qty 1

## 2020-02-28 MED ORDER — ENSURE ENLIVE PO LIQD
237.0000 mL | ORAL | Status: DC
  Administered 2020-02-28 – 2020-03-02 (×4): 237 mL via ORAL

## 2020-02-28 MED ORDER — LANTUS SOLOSTAR 100 UNIT/ML ~~LOC~~ SOPN: 18.0000 [IU] | PEN_INJECTOR | Freq: Every day | SUBCUTANEOUS | 11 refills | Status: DC

## 2020-02-28 MED ORDER — ADULT MULTIVITAMIN W/MINERALS CH
1.0000 | ORAL_TABLET | Freq: Every day | ORAL | Status: DC
  Administered 2020-02-28 – 2020-03-03 (×5): 1 via ORAL
  Filled 2020-02-28 (×4): qty 1

## 2020-02-28 NOTE — TOC Progression Note (Signed)
Transition of Care [facility]) - Progression Note    Patient Details  Name: Adam [name] MRN: [id number] Date of Birth: 1951-02-11  Transition of [facility]) CM/SW [place], [place] Phone Number: 02/28/2020, 9:57 AM  Clinical Narrative:     Insurance authorization is still pending for patient. Warehouse manager number [id number]. Patient has SN[facility] bed at [facility].  CSW will continue to follow.  Expected Discharge Plan: Skilled Nursing Facility Barriers to Discharge: No Barriers Identified  Expected Discharge Plan and Services Expected Discharge Plan: [place]                                               Social Determinants of Health (SDOH) Interventions    Readmission Risk Interventions Readmission Risk Prevention Plan 02/27/2020  Transportation Screening Complete  PCP or Specialist Appt within 3-5 Days Complete  HRI or [place] Complete  Social Work Consult for [place] Planning/Counseling Complete  Palliative Care Screening Not Applicable  Medication Review Press photographer) Complete  Some recent data might be hidden

## 2020-02-28 NOTE — Progress Notes (Signed)
Initial Nutrition Assessment  DOCUMENTATION CODES:   Severe malnutrition in context of acute illness/injury  INTERVENTION:    Ensure Enlive po once daily, each supplement provides 350 kcal and 20 grams of protein  MVI with minerals daily  NUTRITION DIAGNOSIS:   Severe Malnutrition related to acute illness(recent Werneke's encephalopathy) as evidenced by moderate muscle depletion, moderate fat depletion, percent weight loss(16% weight loss within 3 months).  GOAL:   Patient will meet greater than or equal to 90% of their needs  MONITOR:   PO intake, Supplement acceptance, Labs  REASON FOR ASSESSMENT:   Malnutrition Screening Tool    ASSESSMENT:   69 yo male admitted with A fib with RVR. PMH includes DM-2, CAD, A fib, COPD, HTN, HLD, OSA. Recent admission for frequent falls, Werneke's encephalopathy.   Patient says that he has been eating poorly since "I hit my head on the concrete 6 weeks ago." Reports >50 lb weight loss over that time frame. He c/o no appetite. Since this admission, he has been eating better. Meal intakes documented at 50-100% with an average of 75% meal completion for the past 6 meals recorded.  Discussed the importance of adequate nutrition to prevent further loss of lean body mass. Patient agreeable to trying Ensure Enlive supplement once daily to maximize oral intake of kcal and protein.   Labs reviewed.  CBG: 150  Medications reviewed and include lantus, tradjenta, slow-mag, MVI, KCl, thiamine.  Per review of weight encounters, patient has had 16% weight loss over the past 3 months, which is significant for the time frame.  Patient meets criteria for severe malnutrition, given recent significant weight loss with moderate depletion of muscle and subcutaneous fat mass.   NUTRITION - FOCUSED PHYSICAL EXAM:    Most Recent Value  Orbital Region  Mild depletion  Upper Arm Region  Moderate depletion  Thoracic and Lumbar Region  No depletion  Buccal  Region  No depletion  Temple Region  Mild depletion  Clavicle Bone Region  Mild depletion  Clavicle and Acromion Bone Region  Mild depletion  Scapular Bone Region  No depletion  Dorsal Hand  Mild depletion  Patellar Region  Moderate depletion  Anterior Thigh Region  Moderate depletion  Posterior Calf Region  Moderate depletion  Edema (RD Assessment)  None  Hair  Reviewed  Eyes  Reviewed  Mouth  Reviewed  Skin  Reviewed  Nails  Reviewed       Diet Order:   Diet Order            Diet - low sodium heart healthy        Diet Carb Modified        Diet heart healthy/carb modified Room service appropriate? Yes; Fluid consistency: Thin  Diet effective now              EDUCATION NEEDS:   No education needs have been identified at this time  Skin:  Skin Assessment: Skin Integrity Issues: Skin Integrity Issues:: Stage II Stage II: sacrum  Last BM:  5/25  Height:   Ht Readings from Last 1 Encounters:  02/09/20 5\' 10"  (1.778 m)    Weight:   Wt Readings from Last 1 Encounters:  02/28/20 96.6 kg    Ideal Body Weight:  75.5 kg  BMI:  Body mass index is 30.56 kg/m.  Estimated Nutritional Needs:   Kcal:  2000-2200  Protein:  120-135 gm  Fluid:  >/= 2 L    Lucas Mallow, RD, LDN, CNSC Please  refer to [facility] for contact information.

## 2020-02-28 NOTE — TOC Progression Note (Signed)
Transition of Care [facility]) - Progression Note    Patient Details  Name: Adam [name] MRN: [id number] Date of Birth: 08-15-1951  Transition of Care [facility]) CM/SW [place], [place] Phone Number: 02/28/2020, 4:42 PM  Clinical Narrative:     CSW just spoke with insurance and they can not approve insurance at this time until patient is medically stable for SNF placement. They asked CSW to fax over current therapy and progress note. CSW faxed over physical therapy notes as well as progress note. CSW will follow up with insurance authorization for patient tomorrow morning for possible insurance authorization to be approved.  Insurance authorization pending. Patient has bed at [facility].  CSW will continue to follow.  Expected Discharge Plan: Skilled Nursing Facility Barriers to Discharge: No Barriers Identified  Expected Discharge Plan and Services Expected Discharge Plan: [place]         Expected Discharge Date: 02/28/20                                     Social Determinants of Health (SDOH) Interventions    Readmission Risk Interventions Readmission Risk Prevention Plan 02/27/2020  Transportation Screening Complete  PCP or Specialist Appt within 3-5 Days Complete  HRI or [place] Complete  Social Work Consult for [place] Planning/Counseling Complete  Palliative Care Screening Not Applicable  Medication Review Press photographer) Complete  Some recent data might be hidden

## 2020-02-28 NOTE — Progress Notes (Signed)
Pharmacy: Dofetilide (Tikosyn) - Follow Up Assessment and Electrolyte Replacement  Pharmacy consulted to assist in monitoring and replacing electrolytes in this 69 y.o. male admitted on 02/20/2020 undergoing dofetilide re-initiation.   Labs:    Component Value Date/Time   K 4.1 02/28/2020 0340   MG 1.9 02/28/2020 0340     Plan: Potassium: K >/= 4: No additional supplementation needed  Magnesium: Mg 1.8-2: Give Mg 2 gm IV x1    As patient has required on average 60 mEq of potassium replacement every day, recommend discharging patient with:  Potassium chloride 70meq in am and 49meq at dinner (he was on this prior to admission)  Thank you for allowing pharmacy to participate in this patient's care   Hildred Laser, PharmD Clinical Pharmacist **Pharmacist phone directory can now be found on [email or web address] ([email or web address]).  Listed under [place].

## 2020-02-28 NOTE — Care Management Important Message (Signed)
Important Message  Patient Details  Name: Adam [name] MRN: [id number] Date of Birth: September 03, 1951   Medicare Important Message Given:  Yes     [name] 02/28/2020, 2:43 PM

## 2020-02-28 NOTE — Progress Notes (Signed)
PROGRESS NOTE  Adam [name] [id number] DOB: Apr 13, 1951   PCP: [name], MD  Patient is from: Home  DOA: 02/20/2020 LOS: 8  Brief Narrative / Interim history: 69 year old male with history of DM-2, CAD, A. Fib s/p ablation and DCCV, COPD, HTN, HLD, OSA not on CPAP, anxiety and depression admitted for atrial fibrillation with RVR.  Has been on Tikosyn and Cardizem drip per cardiology.  However, he was noted to have gradually progressive prolonged QTc.  Tikosyn was put on hold.  Initially, there was plan for TEE and DCCV early next week.  However, given improvement in his heart rate he was eventually transitioned to p.o. Cardizem CD.  Tikosyn resumed as well.  QTc remained stable at about 470.    Therapy recommended SNF.  Patient became diaphoretic with heart rate up to 130s when PT attempted to mobilize before discharge to SNF.  Subjective: Seen and examined earlier this morning.  No major events overnight of this morning.  HR ranges from 100-1 110s.  Not symptomatic other than generalized weakness.  Denies chest pain, dyspnea, palpitation, GI or UTI symptoms.  Objective: Vitals:   02/27/20 2017 02/28/20 0429 02/28/20 0745 02/28/20 1417  BP: 137/89 (!) 114/93    Pulse: 92 92 94 (!) 103  Resp: 20 20 16 16   Temp: 98.1 F (36.7 C) 98.4 F (36.9 C)  97.9 F (36.6 C)  TempSrc: Oral Oral  Oral  SpO2: 96% 96% 98% 97%  Weight:  96.6 kg      Intake/Output Summary (Last 24 hours) at 02/28/2020 1507 Last data filed at 02/28/2020 0500 Gross per 24 hour  Intake --  Output 900 ml  Net -900 ml   Filed Weights   02/26/20 0132 02/27/20 0457 02/28/20 0429  Weight: 105 kg 97.3 kg 96.6 kg    Examination:  GENERAL: No apparent distress.  Nontoxic. HEENT: MMM.  Vision and hearing grossly intact.  NECK: Supple.  No apparent JVD.  RESP: 98% on room air.  No IWOB.  Fair aeration bilaterally. CVS: HR 100-110 at rest.  Heart sounds normal.  ABD/GI/GU: BS+. Abd soft, NTND.   MSK/EXT:  Moves extremities. No apparent deformity. No edema.  SKIN: no apparent skin lesion or wound NEURO: Awake, alert and oriented appropriately.  No apparent focal neuro deficit. PSYCH: Calm. Normal affect.   Procedures:  None  Microbiology summarized: COVID-19 PCR negative. Blood culture negative (only 1 bottle).  Assessment & Plan: A. fib with RVR: HR 100-110.  Had ablation and DCCV in the past. -Continue p.o. Tikosyn and Eliquis.  Increase p.o. Cardizem CD to 240 mg daily -No plan for TEE/DCCV at this point -Patient became diaphoretic with heart rate up to 130s when attempted to get him out of the bed  Prolonged QTc: was 485 on EKG but adjusts to 470s for tachycardia -Optimize electrolytes -Appreciate EP insight and guidance  DM-2 with hyperglycemia and hyperlipidemia Recent Labs    02/26/20 2200 02/27/20 2237  GLUCAP 153* 150*  -Continue current regimen with statin   History of CAD: No anginal symptoms. -Continue home medications  Essential hypertension: Normotensive -Continue Cardizem as above  Chronic COPD -Continue LABA/LAMA/ICS with as needed nebulizers  OSA: -Continue nightly CPAP  Generalized weakness/debility -OOB/PT/OT        Pressure Injury 02/20/20 Sacrum Medial Stage 2 -  Partial thickness loss of dermis presenting as a shallow open injury with a red, pink wound bed without slough. (Active)  02/20/20 2249  Location: Sacrum  Location  Orientation: Medial  Staging: Stage 2 -  Partial thickness loss of dermis presenting as a shallow open injury with a red, pink wound bed without slough.  Wound Description (Comments):   Present on Admission: Yes   DVT prophylaxis: On Eliquis for A. fib Code Status: DNR/DNI Family Communication: Patient and/or RN. Available if any question.  Status is: Inpatient  Remains inpatient appropriate because:Inpatient level of care appropriate due to severity of illness  Hemodynamically unstable.  Patient became  diaphoretic with heart rate up to 130s when PT attempted to get him out of the bed this morning.   Dispo: The patient is from: Home              Anticipated d/c is to: SNF              Anticipated d/c date is: 1 day              Patient currently is not medically stable to d/c.        Consultants:  Electrophysiology   Sch Meds:  Scheduled Meds: . allopurinol  300 mg Oral Daily  . apixaban  5 mg Oral BID  . aspirin  81 mg Oral Daily  . [START ON 02/29/2020] diltiazem  240 mg Oral Daily  . dofetilide  125 mcg Oral BID  . DULoxetine  60 mg Oral Daily  . feeding supplement (ENSURE ENLIVE)  237 mL Oral Q24H  . fenofibrate  160 mg Oral QAC breakfast  . fluticasone furoate-vilanterol  1 puff Inhalation Daily   And  . umeclidinium bromide  1 puff Inhalation Daily  . icosapent Ethyl  2 g Oral BID  . insulin glargine  18 Units Subcutaneous QHS  . linagliptin  5 mg Oral Daily  . losartan  100 mg Oral Daily  . magnesium chloride  1 tablet Oral Daily  . montelukast  10 mg Oral QHS  . multivitamin with minerals  1 tablet Oral Daily  . pantoprazole  40 mg Oral Daily  . potassium chloride  40 mEq Oral Daily   And  . potassium chloride  20 mEq Oral QAC supper  . QUEtiapine  25 mg Oral BID  . rosuvastatin  40 mg Oral Daily  . sodium chloride flush  3 mL Intravenous Q12H  . thiamine  100 mg Oral Daily   Continuous Infusions: . sodium chloride    . sodium chloride 250 mL (02/27/20 0753)   PRN Meds:.sodium chloride, acetaminophen, fluticasone, gabapentin, hydrocortisone, ondansetron (ZOFRAN) IV, sodium chloride flush  Antimicrobials: Anti-infectives (From admission, onward)   None       I have personally reviewed the following labs and images: CBC: No results for input(s): WBC, NEUTROABS, HGB, HCT, MCV, PLT in the last 168 hours. BMP &GFR Recent Labs  Lab 02/25/20 0033 02/25/20 0830 02/26/20 0430 02/27/20 0453 02/28/20 0340  NA 139 136 138 137 135  K 4.6 3.8 4.7 4.0  4.1  CL 108 106 106 105 105  CO2 22 21* 21* 23 21*  GLUCOSE 164* 152* 147* 149* 140*  BUN 5* 6* 6* 6* 7*  CREATININE 0.79 0.79 0.84 0.84 0.77  CALCIUM 9.9 10.1 9.8 10.3 10.1  MG 1.9 1.7 2.0 1.9 1.9   Estimated Creatinine Clearance: 101.6 mL/min (by C-G formula based on SCr of 0.77 mg/dL). Liver & Pancreas: No results for input(s): AST, ALT, ALKPHOS, BILITOT, PROT, ALBUMIN in the last 168 hours. No results for input(s): LIPASE, AMYLASE in the last 168 hours. No results  for input(s): AMMONIA in the last 168 hours. Diabetic: No results for input(s): HGBA1C in the last 72 hours. Recent Labs  Lab 02/24/20 2051 02/25/20 0757 02/25/20 1122 02/26/20 2200 02/27/20 2237  GLUCAP 129* 145* 185* 153* 150*   Cardiac Enzymes: No results for input(s): CKTOTAL, CKMB, CKMBINDEX, TROPONINI in the last 168 hours. Recent Labs    12/03/19 1502  PROBNP 266.0*   Coagulation Profile: Recent Labs  Lab 02/25/20 0033  INR 1.3*   Thyroid Function Tests: No results for input(s): TSH, T4TOTAL, FREET4, T3FREE, THYROIDAB in the last 72 hours. Lipid Profile: No results for input(s): CHOL, HDL, LDLCALC, TRIG, CHOLHDL, LDLDIRECT in the last 72 hours. Anemia Panel: No results for input(s): VITAMINB12, FOLATE, FERRITIN, TIBC, IRON, RETICCTPCT in the last 72 hours. Urine analysis:    Component Value Date/Time   COLORURINE AMBER (A) 02/20/2020 1957   APPEARANCEUR HAZY (A) 02/20/2020 1957   LABSPEC 1.020 02/20/2020 1957   PHURINE 5.0 02/20/2020 1957   GLUCOSEU 50 (A) 02/20/2020 1957   HGBUR NEGATIVE 02/20/2020 1957   BILIRUBINUR NEGATIVE 02/20/2020 1957   BILIRUBINUR 1+ 08/14/2013 1200   KETONESUR NEGATIVE 02/20/2020 1957   PROTEINUR 30 (A) 02/20/2020 1957   UROBILINOGEN 0.2 08/14/2013 1200   NITRITE NEGATIVE 02/20/2020 1957   LEUKOCYTESUR TRACE (A) 02/20/2020 1957   Sepsis Labs: Invalid input(s): PROCALCITONIN, [place]  Microbiology: Recent Results (from the past 240 hour(s))  SARS  Coronavirus 2 by RT PCR (hospital order, performed in [facility] hospital lab) Nasopharyngeal Nasopharyngeal Swab     Status: None   Collection Time: 02/20/20  6:38 PM   Specimen: Nasopharyngeal Swab  Result Value Ref Range Status   SARS Coronavirus 2 NEGATIVE NEGATIVE Final    Comment: (NOTE) SARS-CoV-2 target nucleic acids are NOT DETECTED. The SARS-CoV-2 RNA is generally detectable in upper and lower respiratory specimens during the acute phase of infection. The lowest concentration of SARS-CoV-2 viral copies this assay can detect is 250 copies / mL. A negative result does not preclude SARS-CoV-2 infection and should not be used as the sole basis for treatment or other patient management decisions.  A negative result may occur with improper specimen collection / handling, submission of specimen other than nasopharyngeal swab, presence of viral mutation(s) within the areas targeted by this assay, and inadequate number of viral copies (<250 copies / mL). A negative result must be combined with clinical observations, patient history, and epidemiological information. Fact Sheet for Patients:   StrictlyIdeas.no Fact Sheet for Healthcare Providers: BankingDealers.co.za This test is not yet approved or cleared  by the [place] FDA and has been authorized for detection and/or diagnosis of SARS-CoV-2 by FDA under an Emergency Use Authorization (EUA).  This EUA will remain in effect (meaning this test can be used) for the duration of the COVID-19 declaration under Section 564(b)(1) of the Act, 21 U.S.C. section 360bbb-3(b)(1), unless the authorization is terminated or revoked sooner. Performed at [facility], [place] [address] [place], [place] [address]   Culture, blood (single) w Reflex to ID Panel     Status: None   Collection Time: 02/20/20  7:34 PM   Specimen: BLOOD  Result Value Ref Range Status   Specimen Description BLOOD  LEFT ANTECUBITAL  Final   Special Requests   Final    BOTTLES DRAWN AEROBIC AND ANAEROBIC Blood Culture adequate volume   Culture   Final    NO GROWTH 5 DAYS Performed at [facility], [place],  [place] [address]    Report Status 02/25/2020 FINAL  Final  SARS CORONAVIRUS 2 (TAT 6-24 HRS) Nasopharyngeal Nasopharyngeal Swab     Status: None   Collection Time: 02/27/20 11:33 AM   Specimen: Nasopharyngeal Swab  Result Value Ref Range Status   SARS Coronavirus 2 NEGATIVE NEGATIVE Final    Comment: (NOTE) SARS-CoV-2 target nucleic acids are NOT DETECTED. The SARS-CoV-2 RNA is generally detectable in upper and lower respiratory specimens during the acute phase of infection. Negative results do not preclude SARS-CoV-2 infection, do not rule out co-infections with other pathogens, and should not be used as the sole basis for treatment or other patient management decisions. Negative results must be combined with clinical observations, patient history, and epidemiological information. The expected result is Negative. Fact Sheet for Patients: SugarRoll.be Fact Sheet for Healthcare Providers: https://www.woods-mathews.com/ This test is not yet approved or cleared by the [place] FDA and  has been authorized for detection and/or diagnosis of SARS-CoV-2 by FDA under an Emergency Use Authorization (EUA). This EUA will remain  in effect (meaning this test can be used) for the duration of the COVID-19 declaration under Section 56 4(b)(1) of the Act, 21 U.S.C. section 360bbb-3(b)(1), unless the authorization is terminated or revoked sooner. Performed at [facility], [place] [address] [place], [place] [address]     Radiology Studies: No results found.    Garielle Mroz T. [place]  If 7PM-7AM, please contact night-coverage [email or web address] Password [facility] 02/28/2020, 3:07 PM

## 2020-02-28 NOTE — Progress Notes (Addendum)
Physical Therapy Treatment Patient Details Name: Adam [name] MRN: [id number] DOB: [birth year] Today's Date: 02/28/2020    History of Present Illness 69 y.o. male with medical history significant of diabetes, coronary artery disease status post coronary artery bypass grafting, atrial fibrillation not on any anticoagulation due to fear of bleed, asthma and COPD who was brought in from skilled facility was A. fib with RVR.and admitted 02/20/20    PT Comments    Pt with less confusion today and eager to get out of bed with therapy today. Pt limited in safe mobility today by increase in HR and BP with movement in presence of decreased strength. Pt is currently min A for bed mobility. However pt HR rose to 138 bpm and dizzy with sitting EoB and BP increased. With continued sitting 3 minutes, HR dropped to 120s however, pt became diaphoretic and dizzy and requested to return to supine. Pt requires min A to return to bed. Once back in supine HR returned to 100s and dizziness subsided. Pt agreeable to having bed placed in chair position and tolerated well able to participate in seated exercise and HR in 110s. D/c plans remain appropriate when pt is medically stable. PT will continue to follow acutely.  BPs  Supine @ 4:30 am (from chart) 114/93  Sitting 157/123  Sitting after 3 min 134/104  Supine  128/68  Bed in chair position  136/92       Follow Up Recommendations  SNF     Equipment Recommendations  Other (comment)(TBD at next venue)    Recommendations for Other Services OT consult     Precautions / Restrictions Precautions Precautions: Fall Precaution Comments: fell at facility day prior to admission  Restrictions Weight Bearing Restrictions: No    Mobility  Bed Mobility Overal bed mobility: Needs Assistance Bed Mobility: Supine to Sit     Supine to sit: Min assist Sit to supine: Min assist   General bed mobility comments: min A and multimodal cuing for sequencing to  come to EoB, HoB elevated and assist for pt to pull up against therapist to come to EoB, requires min A for squaring shoulders and hips in the bed.   Transfers                 General transfer comment: unable to attempt due to increased HR and BP with sitting EoB        Balance Overall balance assessment: Needs assistance Sitting-balance support: Feet supported;Single extremity supported Sitting balance-Leahy Scale: Fair Sitting balance - Comments: sat EOB with UEs upport                                    Cognition Arousal/Alertness: Awake/alert Behavior During Therapy: WFL for tasks assessed/performed Overall Cognitive Status: History of cognitive impairments - at baseline                     Current Attention Level: Divided   Following Commands: Follows multi-step commands with increased time   Awareness: Emergent Problem Solving: Slow processing;Decreased initiation;Difficulty sequencing;Requires verbal cues General Comments: continues to need cuing but is able to sequence better today.      Exercises General Exercises - Lower Extremity Ankle Circles/Pumps: AROM;Both;10 reps;Seated Gluteal Sets: AROM;Both;10 reps;Seated Short Arc Quad: AROM;Both;10 reps;Seated Hip Flexion/Marching: AROM;Both;10 reps;Seated    General Comments General comments (skin integrity, edema, etc.): HR in 90's in supine, with sitting EoB  pt became diaphoretic and dizzy with HR increase to 138 bpm, with 3 min sitting HR in 120s      Pertinent Vitals/Pain Pain Assessment: No/denies pain           PT Goals (current goals can now be found in the care plan section) Acute Rehab PT Goals Patient Stated Goal: not stated PT Goal Formulation: With patient Time For Goal Achievement: 03/11/20 Potential to Achieve Goals: Fair Progress towards PT goals: Progressing toward goals    Frequency    Min 2X/week      PT Plan Current plan remains appropriate        AM-PAC PT "6 Clicks" Mobility   Outcome Measure  Help needed turning from your back to your side while in a flat bed without using bedrails?: A Lot Help needed moving from lying on your back to sitting on the side of a flat bed without using bedrails?: Total Help needed moving to and from a bed to a chair (including a wheelchair)?: Total Help needed standing up from a chair using your arms (e.g., wheelchair or bedside chair)?: Total Help needed to walk in hospital room?: Total Help needed climbing 3-5 steps with a railing? : Total 6 Click Score: 7    End of Session   Activity Tolerance: Treatment limited secondary to medical complications (Comment)(increased HR and BP) Patient left: in bed;with call bell/phone within reach;with bed alarm set(placed in chair position ) Nurse Communication: Mobility status;Other (comment)(increased HR in sitting) PT Visit Diagnosis: Unsteadiness on feet (R26.81);Other abnormalities of gait and mobility (R26.89);Repeated falls (R29.6);Muscle weakness (generalized) (M62.81);Difficulty in walking, not elsewhere classified (R26.2);Dizziness and giddiness (R42)     Time: [id number] PT Time Calculation (min) (ACUTE ONLY): 26 min  Charges:  $Therapeutic Exercise: 8-22 mins $Therapeutic Activity: 8-22 mins                     Bertine Schlottman B. Migdalia Dk PT, DPT Acute Rehabilitation Services Pager [phone] Office [phone]    [place] 02/28/2020, 3:37 PM

## 2020-02-29 DIAGNOSIS — R5381 Other malaise: Secondary | ICD-10-CM

## 2020-02-29 DIAGNOSIS — I959 Hypotension, unspecified: Secondary | ICD-10-CM

## 2020-02-29 LAB — CBC
HCT: 41.9 % (ref 39.0–52.0)
Hemoglobin: 13.9 g/dL (ref 13.0–17.0)
MCH: 31.3 pg (ref 26.0–34.0)
MCHC: 33.2 g/dL (ref 30.0–36.0)
MCV: 94.4 fL (ref 80.0–100.0)
Platelets: 266 10*3/uL (ref 150–400)
RBC: 4.44 MIL/uL (ref 4.22–5.81)
RDW: 15.1 % (ref 11.5–15.5)
WBC: 7.4 10*3/uL (ref 4.0–10.5)
nRBC: 0 % (ref 0.0–0.2)

## 2020-02-29 LAB — RENAL FUNCTION PANEL
Albumin: 3.5 g/dL (ref 3.5–5.0)
Anion gap: 10 (ref 5–15)
BUN: 9 mg/dL (ref 8–23)
CO2: 24 mmol/L (ref 22–32)
Calcium: 10.5 mg/dL — ABNORMAL HIGH (ref 8.9–10.3)
Chloride: 102 mmol/L (ref 98–111)
Creatinine, Ser: 0.86 mg/dL (ref 0.61–1.24)
GFR calc Af Amer: >60 mL/min (ref >60)
GFR calc non Af Amer: >60 mL/min (ref >60)
Glucose, Bld: 172 mg/dL — ABNORMAL HIGH (ref 70–99)
Phosphorus: 4.3 mg/dL (ref 2.5–4.6)
Potassium: 4.7 mmol/L (ref 3.5–5.1)
Sodium: 136 mmol/L (ref 135–145)

## 2020-02-29 LAB — GLUCOSE, CAPILLARY: Glucose-Capillary: 172 mg/dL — ABNORMAL HIGH (ref 70–99)

## 2020-02-29 LAB — MAGNESIUM: Magnesium: 1.9 mg/dL (ref 1.7–2.4)

## 2020-02-29 MED ORDER — POTASSIUM CHLORIDE CRYS ER 20 MEQ PO TBCR: 40.0000 meq | EXTENDED_RELEASE_TABLET | Freq: Every day | ORAL | Status: DC

## 2020-02-29 MED ORDER — MAGNESIUM SULFATE 2 GM/50ML IV SOLN
2.0000 g | Freq: Once | INTRAVENOUS | Status: AC
  Administered 2020-02-29: 2 g via INTRAVENOUS
  Filled 2020-02-29: qty 50

## 2020-02-29 NOTE — Progress Notes (Addendum)
Pharmacy: Dofetilide (Tikosyn) - Follow Up Assessment and Electrolyte Replacement  Pharmacy consulted to assist in monitoring and replacing electrolytes in this 69 y.o. male admitted on 02/20/2020 undergoing dofetilide re-initiation.   Labs:    Component Value Date/Time   K 4.7 02/29/2020 0309   MG 1.9 02/29/2020 0309     Plan: Potassium: K >/= 4: No additional supplementation needed  Magnesium: Mg 1.8-2: Give Mg 2 gm IV x1   Recommend discharging patient with prescription for: -Potassium chloride 40 mEq  daily   Hildred Laser, PharmD Clinical Pharmacist **Pharmacist phone directory can now be found on [email or web address] (PW [email or web address]).  Listed under [place].  02/29/2020  11:54 AM

## 2020-02-29 NOTE — TOC Progression Note (Addendum)
Transition of Care [facility]) - Progression Note    Patient Details  Name: Adam [name] MRN: [id number] Date of Birth: 1951/06/13  Transition of Care [facility]) CM/SW [place], [place] Phone Number: 02/29/2020, 1:31 PM  Clinical Narrative:     CSW received insurance approval for tomorrow 03/01/20.Reference number is [id number]. Insurance will be good through 6/2. Patient has bed at  [facility] place when medically ready for discharge.  Patient has bed at [facility] place. Insurance has been approved.  TOC team will continue to follow.  Expected Discharge Plan: [place] Barriers to Discharge: No Barriers Identified  Expected Discharge Plan and Services Expected Discharge Plan: [place]         Expected Discharge Date: 02/29/20                                     Social Determinants of Health (SDOH) Interventions    Readmission Risk Interventions Readmission Risk Prevention Plan 02/27/2020  Transportation Screening Complete  PCP or Specialist Appt within 3-5 Days Complete  HRI or [place] Complete  Social Work Consult for [place] Planning/Counseling Complete  Palliative Care Screening Not Applicable  Medication Review Press photographer) Complete  Some recent data might be hidden

## 2020-02-29 NOTE — Progress Notes (Signed)
PROGRESS NOTE  Adam [name] [id number] DOB: [birth year]   PCP: [name], MD  Patient is from: Home  DOA: 02/20/2020 LOS: 9  Brief Narrative / Interim history: 69 year old male with history of DM-2, CAD, A. Fib s/p ablation and DCCV, COPD, HTN, HLD, OSA not on CPAP, anxiety and depression admitted for atrial fibrillation with RVR.  Has been on Tikosyn and Cardizem drip per cardiology.  However, he was noted to have gradually progressive prolonged QTc.  Tikosyn was put on hold.  Initially, there was plan for TEE and DCCV early next week.  However, given improvement in his heart rate he was eventually transitioned to p.o. Cardizem CD.  Tikosyn resumed as well.  QTc remained stable at about 470.    Patient remained debilitated with significant tachycardia and orthostatic hypotension limiting his participation in therapy.   Subjective: Seen and examined earlier this morning.  No major events overnight of this morning.  No complaints.  He denies chest pain, shortness of breath, palpitation, GI or UTI symptoms.  However, he was diaphoretic and dizzy working with PT with heart rate up from 0000000 to [id number] systolic blood pressure down from 125-101.   Objective: Vitals:   02/28/20 2121 02/29/20 0532 02/29/20 0830 02/29/20 0910  BP: 118/75 125/78 (!) 110/93   Pulse: (!) 108 100  93  Resp: 17 18  16   Temp: 97.6 F (36.4 C) 97.9 F (36.6 C)    TempSrc:      SpO2: 97% 97%  96%  Weight:  96.5 kg     No intake or output data in the 24 hours ending 02/29/20 1326 Filed Weights   02/27/20 0457 02/28/20 0429 02/29/20 0532  Weight: 97.3 kg 96.6 kg 96.5 kg    Examination:  GENERAL: No apparent distress.  Nontoxic. HEENT: MMM.  Vision and hearing grossly intact.  NECK: Supple.  No apparent JVD.  RESP: 97% on RA.  No IWOB.  Fair aeration bilaterally. CVS: 90-100. Heart sounds normal.  ABD/GI/GU: BS+. Abd soft, NTND.  MSK/EXT:  Moves extremities. No apparent deformity. No edema.    SKIN: no apparent skin lesion or wound NEURO: Awake, alert and oriented appropriately.  No apparent focal neuro deficit. PSYCH: Calm. Normal affect.  Procedures:  None  Microbiology summarized: COVID-19 PCR negative. Blood culture negative (only 1 bottle).  Assessment & Plan: A. fib with RVR: HR 90-100 at rest.  Had ablation and DCCV in the past. -Continue p.o. Tikosyn and Eliquis.  Increase p.o. Cardizem CD to 240 mg daily -No plan for TEE/DCCV at this point -Patient became diaphoretic with heart rate up to 130s when attempted to get him out of the bed  Orthostatic hypotension: Significant symptomatic orthostasis while trying to work with PT.  -OOB for meals -TED hose  Prolonged QTc: was 485 on EKG but adjusts to 470s for tachycardia -Closely monitor and optimize electrolytes -EP signed off.  DM-2 with hyperglycemia and hyperlipidemia Recent Labs    02/26/20 2200 02/27/20 2237  GLUCAP 153* 150*  -Continue current regimen with statin   History of CAD: No anginal symptoms. -Continue home medications  Essential hypertension: Normotensive -Continue Cardizem as above  Chronic COPD -Continue LABA/LAMA/ICS with as needed nebulizers  OSA: -Continue nightly CPAP  Debility-very deconditioned with significant orthostasis with limiting his participation in therapy -Continue OOB/PT/OT  Nutrition Problem: Severe Malnutrition Etiology: acute illness(recent Werneke's encephalopathy) Signs/Symptoms: moderate muscle depletion, moderate fat depletion, percent weight loss(16% weight loss within 3 months) Percent weight loss: 16 %  Interventions: Ensure Enlive (each supplement provides 350kcal and 20 grams of protein), MVI Pressure Injury 02/20/20 Sacrum Medial Stage 2 -  Partial thickness loss of dermis presenting as a shallow open injury with a red, pink wound bed without slough. (Active)  02/20/20 2249  Location: Sacrum  Location Orientation: Medial  Staging: Stage 2 -   Partial thickness loss of dermis presenting as a shallow open injury with a red, pink wound bed without slough.  Wound Description (Comments):   Present on Admission: Yes   DVT prophylaxis: On Eliquis for A. fib Code Status: DNR/DNI Family Communication: Patient and/or RN. Available if any question.  Status is: Inpatient  Remains inpatient appropriate because:Inpatient level of care appropriate due to severity of illness  Hemodynamically unstable.  Patient with significant orthostasis limiting his ability to participate in therapy   Dispo: The patient is from: Home              Anticipated d/c is to: SNF              Anticipated d/c date is: 3 days              Patient currently is not medically stable to d/c.        Consultants:  Electrophysiology   Sch Meds:  Scheduled Meds: . allopurinol  300 mg Oral Daily  . apixaban  5 mg Oral BID  . aspirin  81 mg Oral Daily  . diltiazem  240 mg Oral Daily  . dofetilide  125 mcg Oral BID  . DULoxetine  60 mg Oral Daily  . feeding supplement (ENSURE ENLIVE)  237 mL Oral Q24H  . fenofibrate  160 mg Oral QAC breakfast  . fluticasone furoate-vilanterol  1 puff Inhalation Daily   And  . umeclidinium bromide  1 puff Inhalation Daily  . icosapent Ethyl  2 g Oral BID  . insulin glargine  18 Units Subcutaneous QHS  . linagliptin  5 mg Oral Daily  . losartan  100 mg Oral Daily  . magnesium chloride  1 tablet Oral Daily  . montelukast  10 mg Oral QHS  . multivitamin with minerals  1 tablet Oral Daily  . pantoprazole  40 mg Oral Daily  . potassium chloride  40 mEq Oral Daily  . QUEtiapine  25 mg Oral BID  . rosuvastatin  40 mg Oral Daily  . sodium chloride flush  3 mL Intravenous Q12H  . thiamine  100 mg Oral Daily   Continuous Infusions: . sodium chloride    . sodium chloride 250 mL (02/27/20 0753)  . magnesium sulfate bolus IVPB     PRN Meds:.sodium chloride, acetaminophen, fluticasone, gabapentin, hydrocortisone, ondansetron  (ZOFRAN) IV, sodium chloride flush  Antimicrobials: Anti-infectives (From admission, onward)   None       I have personally reviewed the following labs and images: CBC: Recent Labs  Lab 02/29/20 0309  WBC 7.4  HGB 13.9  HCT 41.9  MCV 94.4  PLT 266   BMP &GFR Recent Labs  Lab 02/25/20 0830 02/26/20 0430 02/27/20 0453 02/28/20 0340 02/29/20 0309  NA 136 138 137 135 136  K 3.8 4.7 4.0 4.1 4.7  CL 106 106 105 105 102  CO2 21* 21* 23 21* 24  GLUCOSE 152* 147* 149* 140* 172*  BUN 6* 6* 6* 7* 9  CREATININE 0.79 0.84 0.84 0.77 0.86  CALCIUM 10.1 9.8 10.3 10.1 10.5*  MG 1.7 2.0 1.9 1.9 1.9  PHOS  --   --   --   --  4.3   Estimated Creatinine Clearance: 94.5 mL/min (by C-G formula based on SCr of 0.86 mg/dL). Liver & Pancreas: Recent Labs  Lab 02/29/20 0309  ALBUMIN 3.5   No results for input(s): LIPASE, AMYLASE in the last 168 hours. No results for input(s): AMMONIA in the last 168 hours. Diabetic: No results for input(s): HGBA1C in the last 72 hours. Recent Labs  Lab 02/24/20 2051 02/25/20 0757 02/25/20 1122 02/26/20 2200 02/27/20 2237  GLUCAP 129* 145* 185* 153* 150*   Cardiac Enzymes: No results for input(s): CKTOTAL, CKMB, CKMBINDEX, TROPONINI in the last 168 hours. Recent Labs    12/03/19 1502  PROBNP 266.0*   Coagulation Profile: Recent Labs  Lab 02/25/20 0033  INR 1.3*   Thyroid Function Tests: No results for input(s): TSH, T4TOTAL, FREET4, T3FREE, THYROIDAB in the last 72 hours. Lipid Profile: No results for input(s): CHOL, HDL, LDLCALC, TRIG, CHOLHDL, LDLDIRECT in the last 72 hours. Anemia Panel: No results for input(s): VITAMINB12, FOLATE, FERRITIN, TIBC, IRON, RETICCTPCT in the last 72 hours. Urine analysis:    Component Value Date/Time   COLORURINE AMBER (A) 02/20/2020 1957   APPEARANCEUR HAZY (A) 02/20/2020 1957   LABSPEC 1.020 02/20/2020 1957   PHURINE 5.0 02/20/2020 1957   GLUCOSEU 50 (A) 02/20/2020 1957   HGBUR NEGATIVE  02/20/2020 1957   BILIRUBINUR NEGATIVE 02/20/2020 1957   BILIRUBINUR 1+ 08/14/2013 1200   KETONESUR NEGATIVE 02/20/2020 1957   PROTEINUR 30 (A) 02/20/2020 1957   UROBILINOGEN 0.2 08/14/2013 1200   NITRITE NEGATIVE 02/20/2020 1957   LEUKOCYTESUR TRACE (A) 02/20/2020 1957   Sepsis Labs: Invalid input(s): PROCALCITONIN, [place]  Microbiology: Recent Results (from the past 240 hour(s))  SARS Coronavirus 2 by RT PCR (hospital order, performed in [facility] hospital lab) Nasopharyngeal Nasopharyngeal Swab     Status: None   Collection Time: 02/20/20  6:38 PM   Specimen: Nasopharyngeal Swab  Result Value Ref Range Status   SARS Coronavirus 2 NEGATIVE NEGATIVE Final    Comment: (NOTE) SARS-CoV-2 target nucleic acids are NOT DETECTED. The SARS-CoV-2 RNA is generally detectable in upper and lower respiratory specimens during the acute phase of infection. The lowest concentration of SARS-CoV-2 viral copies this assay can detect is 250 copies / mL. A negative result does not preclude SARS-CoV-2 infection and should not be used as the sole basis for treatment or other patient management decisions.  A negative result may occur with improper specimen collection / handling, submission of specimen other than nasopharyngeal swab, presence of viral mutation(s) within the areas targeted by this assay, and inadequate number of viral copies (<250 copies / mL). A negative result must be combined with clinical observations, patient history, and epidemiological information. Fact Sheet for Patients:   StrictlyIdeas.no Fact Sheet for Healthcare Providers: BankingDealers.co.za This test is not yet approved or cleared  by the [place] FDA and has been authorized for detection and/or diagnosis of SARS-CoV-2 by FDA under an Emergency Use Authorization (EUA).  This EUA will remain in effect (meaning this test can be used) for the duration of  the COVID-19 declaration under Section 564(b)(1) of the Act, 21 U.S.C. section 360bbb-3(b)(1), unless the authorization is terminated or revoked sooner. Performed at [facility], [place] [address], [place], [place] [address]   Culture, blood (single) w Reflex to ID Panel     Status: None   Collection Time: 02/20/20  7:34 PM   Specimen: BLOOD  Result Value Ref Range Status   Specimen Description BLOOD LEFT ANTECUBITAL  Final   Special Requests   Final    BOTTLES DRAWN AEROBIC AND ANAEROBIC Blood Culture adequate volume   Culture   Final    NO GROWTH 5 DAYS Performed at [facility], [address], [place] [address]    Report Status 02/25/2020 FINAL  Final  SARS CORONAVIRUS 2 (TAT 6-24 HRS) Nasopharyngeal Nasopharyngeal Swab     Status: None   Collection Time: 02/27/20 11:33 AM   Specimen: Nasopharyngeal Swab  Result Value Ref Range Status   SARS Coronavirus 2 NEGATIVE NEGATIVE Final    Comment: (NOTE) SARS-CoV-2 target nucleic acids are NOT DETECTED. The SARS-CoV-2 RNA is generally detectable in upper and lower respiratory specimens during the acute phase of infection. Negative results do not preclude SARS-CoV-2 infection, do not rule out co-infections with other pathogens, and should not be used as the sole basis for treatment or other patient management decisions. Negative results must be combined with clinical observations, patient history, and epidemiological information. The expected result is Negative. Fact Sheet for Patients: SugarRoll.be Fact Sheet for Healthcare Providers: https://www.woods-mathews.com/ This test is not yet approved or cleared by the [place] FDA and  has been authorized for detection and/or diagnosis of SARS-CoV-2 by FDA under an Emergency Use Authorization (EUA). This EUA will remain  in effect (meaning this test can be used) for the duration of the COVID-19 declaration under Section  56 4(b)(1) of the Act, 21 U.S.C. section 360bbb-3(b)(1), unless the authorization is terminated or revoked sooner. Performed at [facility], [place] [address], [place], [place] [address]     Radiology Studies: No results found.    Oronde Hallenbeck T. [place]  If 7PM-7AM, please contact night-coverage [email or web address] Password [email or web address] 02/29/2020, 1:26 PM

## 2020-02-29 NOTE — Progress Notes (Signed)
Physical Therapy Treatment Patient Details Name: Adam [name] MRN: [id number] DOB: 03-11-1951 Today's Date: 02/29/2020    History of Present Illness 69 y.o. male with medical history significant of diabetes, coronary artery disease status post coronary artery bypass grafting, atrial fibrillation not on any anticoagulation due to fear of bleed, asthma and COPD who was brought in from skilled facility was A. fib with RVR.and admitted 02/20/20    PT Comments    Pt continues to make slow progress towards his goals, however continues to be severely limited in his mobility by his cardiopulmonary response to positional change. Pt requires min A to sit EoB and min A to steady in seated due to increased dizziness and increase in HR. Pt sat for 3 minutes with minimal where dizziness dissipated but did not relieve entirely. Pt agreeable to attempt standing and requires modA to achieve and min A to  and modA for coming to standing where HR increased. Once in standing pt requires minA to maintain until BP collected. Pt request to sit as soon as BP collected. Pt require minA to return to supine. D/c plans continue to be appropriate. PT will continue to follow acutely.  Orthostatic Vitals   BP HR symptoms  Supine 125/86 98-102 none  Sitting 111/73 130 dizziness  Sitting after 3 min 128/108 118 Less dizziness  Standing 101/80 150 Extreme dizziness, diaphoresis  Sitting  129/98 122 Less dizzy, but less responsive to questions  Supine  147/90 97 No dizziness answers questions appropriately     Follow Up Recommendations  SNF     Equipment Recommendations  Other (comment)(TBD at next venue)    Recommendations for Other Services OT consult     Precautions / Restrictions Precautions Precautions: Fall Precaution Comments: fell at facility day prior to admission  Restrictions Weight Bearing Restrictions: No    Mobility  Bed Mobility Overal bed mobility: Needs Assistance Bed Mobility: Supine to  Sit     Supine to sit: Min assist Sit to supine: Min assist   General bed mobility comments: min A and multimodal cuing for sequencing to come to EoB, HoB elevated and assist for pt to pull up against therapist to come to EoB, requires min A for squaring shoulders and hips in the bed.   Transfers Overall transfer level: Needs assistance Equipment used: Rolling walker (2 wheeled) Transfers: Sit to/from Stand Sit to Stand: Mod assist         General transfer comment: mod A for power up and steadying with RW, requires 2 attempts due to feet slipping out from underneath him on initial attempt, feet blocked and pt able to come to fully upright in RW, requires minA to maintain standing for BP due to increase in dizziness.           Balance Overall balance assessment: Needs assistance Sitting-balance support: Feet supported;Single extremity supported Sitting balance-Leahy Scale: Fair Sitting balance - Comments: sat EOB with UEs upport   Standing balance support: Bilateral upper extremity supported Standing balance-Leahy Scale: Poor Standing balance comment: requires outside assist to maintain standing                             Cognition Arousal/Alertness: Awake/alert Behavior During Therapy: WFL for tasks assessed/performed Overall Cognitive Status: History of cognitive impairments - at baseline                     Current Attention Level: Divided  Following Commands: Follows multi-step commands with increased time   Awareness: Emergent Problem Solving: Slow processing;Decreased initiation;Difficulty sequencing;Requires verbal cues General Comments: continues to need cuing but is able to sequence better today.         General Comments General comments (skin integrity, edema, etc.): Continues to have a strong cardiopulmonary response to positional change.       Pertinent Vitals/Pain Pain Assessment: No/denies pain           PT Goals (current  goals can now be found in the care plan section) Acute Rehab PT Goals Patient Stated Goal: not stated PT Goal Formulation: With patient Time For Goal Achievement: 03/11/20 Potential to Achieve Goals: Fair Progress towards PT goals: Progressing toward goals    Frequency    Min 2X/week      PT Plan Current plan remains appropriate       AM-PAC PT "6 Clicks" Mobility   Outcome Measure  Help needed turning from your back to your side while in a flat bed without using bedrails?: A Lot Help needed moving from lying on your back to sitting on the side of a flat bed without using bedrails?: Total Help needed moving to and from a bed to a chair (including a wheelchair)?: Total Help needed standing up from a chair using your arms (e.g., wheelchair or bedside chair)?: Total Help needed to walk in hospital room?: Total Help needed climbing 3-5 steps with a railing? : Total 6 Click Score: 7    End of Session Equipment Utilized During Treatment: Gait belt Activity Tolerance: Treatment limited secondary to medical complications (Comment)(increased HR and BP) Patient left: in bed;with call bell/phone within reach;with bed alarm set(placed in chair position ) Nurse Communication: Mobility status;Other (comment)(increased HR in sitting) PT Visit Diagnosis: Unsteadiness on feet (R26.81);Other abnormalities of gait and mobility (R26.89);Repeated falls (R29.6);Muscle weakness (generalized) (M62.81);Difficulty in walking, not elsewhere classified (R26.2);Dizziness and giddiness (R42)     Time: [id number] PT Time Calculation (min) (ACUTE ONLY): 33 min  Charges:  $Therapeutic Activity: 23-37 mins                     Jaramie [name] B. Migdalia Dk PT, DPT Acute Rehabilitation Services Pager [phone] Office [phone]    [place] 02/29/2020, 1:05 PM

## 2020-03-01 DIAGNOSIS — E46 Unspecified protein-calorie malnutrition: Secondary | ICD-10-CM | POA: Insufficient documentation

## 2020-03-01 DIAGNOSIS — E43 Unspecified severe protein-calorie malnutrition: Secondary | ICD-10-CM | POA: Insufficient documentation

## 2020-03-01 HISTORY — DX: Unspecified severe protein-calorie malnutrition: E43

## 2020-03-01 LAB — RENAL FUNCTION PANEL
Albumin: 3.5 g/dL (ref 3.5–5.0)
Anion gap: 10 (ref 5–15)
BUN: 11 mg/dL (ref 8–23)
CO2: 23 mmol/L (ref 22–32)
Calcium: 10.5 mg/dL — ABNORMAL HIGH (ref 8.9–10.3)
Chloride: 103 mmol/L (ref 98–111)
Creatinine, Ser: 0.72 mg/dL (ref 0.61–1.24)
GFR calc Af Amer: >60 mL/min (ref >60)
GFR calc non Af Amer: >60 mL/min (ref >60)
Glucose, Bld: 196 mg/dL — ABNORMAL HIGH (ref 70–99)
Phosphorus: 3.4 mg/dL (ref 2.5–4.6)
Potassium: 4.2 mmol/L (ref 3.5–5.1)
Sodium: 136 mmol/L (ref 135–145)

## 2020-03-01 LAB — GLUCOSE, CAPILLARY
Glucose-Capillary: 176 mg/dL — ABNORMAL HIGH (ref 70–99)
Glucose-Capillary: 178 mg/dL — ABNORMAL HIGH (ref 70–99)
Glucose-Capillary: 154 mg/dL — ABNORMAL HIGH (ref 70–99)
Glucose-Capillary: 168 mg/dL — ABNORMAL HIGH (ref 70–99)

## 2020-03-01 LAB — HEMOGLOBIN A1C
Hgb A1c MFr Bld: 6.9 % — ABNORMAL HIGH (ref 4.8–5.6)
Mean Plasma Glucose: 151.33 mg/dL

## 2020-03-01 LAB — MAGNESIUM: Magnesium: 1.8 mg/dL (ref 1.7–2.4)

## 2020-03-01 LAB — AMMONIA: Ammonia: 28 umol/L (ref 9–35)

## 2020-03-01 MED ORDER — MAGNESIUM SULFATE 2 GM/50ML IV SOLN
2.0000 g | Freq: Once | INTRAVENOUS | Status: AC
  Administered 2020-03-01: 2 g via INTRAVENOUS
  Filled 2020-03-01: qty 50

## 2020-03-01 MED ORDER — INSULIN ASPART 100 UNIT/ML ~~LOC~~ SOLN
0.0000 [IU] | Freq: Three times a day (TID) | SUBCUTANEOUS | Status: DC
  Administered 2020-03-01 – 2020-03-02 (×4): 2 [IU] via SUBCUTANEOUS
  Administered 2020-03-02: 1 [IU] via SUBCUTANEOUS
  Administered 2020-03-03: 3 [IU] via SUBCUTANEOUS
  Administered 2020-03-03: 2 [IU] via SUBCUTANEOUS

## 2020-03-01 MED ORDER — INSULIN ASPART 100 UNIT/ML ~~LOC~~ SOLN: 0.0000 [IU] | Freq: Every day | SUBCUTANEOUS | Status: DC

## 2020-03-01 NOTE — Progress Notes (Deleted)
Cardiology Office Note   Date:  03/01/2020   ID:  Adam [name], DOB May 06, 1951, MRN [id number]  PCP:  Adam Post, MD  Cardiologist:   Chelesea Weiand Martinique, MD   No chief complaint on file.     History of Present Illness: Adam [name] is a 69 y.o. male who presents to establish cardiac follow up . He is a former patient of Dr Adam [name]. He is followed by Dr. Rayann [name] in the EP clinic. He has a past medical history which includesCABG in 2011 done in [place], HTN, HLD, DM, Atrial fibrillation, and OSA- followed by Dr Adam [name]. He underwent catheter ablation for atrial fibrillation by Dr. Rayann [name] 03/31/18.He is onTikosyn and Xarelto.Event monitor in June 2020 showed no Afib. Because of intermittent palpitations Dr Adam [name] placed an ILR in September 2020. To date Afib burden is 5%. Last check showed longest episode lasting 2 hours. He is followed by Dr. Radford [name] in sleep clinic, but unable to tolerate CPAP. He reports that his sleep apnea is mild.  He had an echo in January of 2019 that showed normal LVEF and no significant valvular abnormalities. Stress test in 2013 showed no ischemia.He had an abnormal chest CT done 01/2018 that showed an ascending aortic aneurysm, measuring 43 x 42 mm. Repeat CT angio in May 2020 showed no change in size.   The patient reports that prior to CABG he had 25 stents placed over a 10-12 year period.  These procedures and his bypass were done in [place], [place]. He notes he previously exercised regularly but over the past 2 years hasn't been able to do much. 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 He has had a couple of injections but this hasn't really helped. He has a history of Etoh use.   He was admitted in January 2021 with lower GI bleed.   He was admitted in May with frequent falls, and confusion, in the ED he was found to be in A. fib with RVR, also noted to have ketoacidosis. He was diagnosed with Wernicke's encephalopathy  secondary to Etoh use and had alcohol withdrawal. He was DC to SNF on 02/16/20. . AFib treated with Cardizem initially but then regular doses of metoprolol and Tikosyn resumed. Later returned to hospital on 02/22/20 with AFib with RVR. Started on IV cardizem. Seen by EP and agreed to be anticoagulated with Eliquis. Patient did convert to NSR. Followed by EP in hospital.      Past Medical History:  Diagnosis Date  . Allergy   . Anxiety    history of PTSD following CABG  . Ascending aortic aneurysm ([place]) 01/31/2018   43 x 42 mm, pt unaware  . Asthma   . Cardiomegaly 10/17/2017  . Colitis- colonoscopy 2014 07/13/2015  . COPD (chronic obstructive pulmonary disease) ([place])   . Coronary artery disease    x 6  . Depression   . Diabetes mellitus without complication ([place])   . Family history of polyps in the colon   . Finger dislocation    Left pinkie  . GERD (gastroesophageal reflux disease)   . Gout   . H/O atrial fibrillation without current medication    following CABG with no documented episodes since then.  . Heart palpitations   . Hx of adenomatous colonic polyps 08/12/2010  . Hyperlipidemia   . Hypertension   . OA (osteoarthritis)   . OSA (obstructive sleep apnea)    Mild, has not received  CPAP yet  . Prediabetes   . RLS (restless legs syndrome)   . 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 ANKLE FRACTURE SURGERY Right 1991  . APPENDECTOMY    . ATRIAL FIBRILLATION ABLATION N/A 03/31/2018   Procedure: ATRIAL FIBRILLATION ABLATION;  Surgeon: [name], MD;  Location: [facility];  Service: Cardiovascular;  Laterality: N/A;  . CARDIAC ELECTROPHYSIOLOGY MAPPING AND ABLATION    . COLONOSCOPY W/ BIOPSIES  2017   x7  . CORONARY ANGIOPLASTY WITH STENT PLACEMENT    . CORONARY ARTERY BYPASS GRAFT    . FINGER SURGERY  04/2018   Small finger left hand  . implantable loop recorder placement  07/02/2019    Medtronic Reveal [place] model [id number] ([id number] [id number]) implanted in office by Dr Adam [name]  . TEE WITHOUT CARDIOVERSION N/A 03/30/2018   Procedure: TRANSESOPHAGEAL ECHOCARDIOGRAM (TEE);  Surgeon: Adam Klein, MD;  Location: [facility];  Service: Cardiovascular;  Laterality: N/A;  . TOTAL HIP ARTHROPLASTY Left   . TOTAL HIP ARTHROPLASTY Right 09/12/2018   Procedure: TOTAL HIP ARTHROPLASTY ANTERIOR APPROACH;  Surgeon: Adam Cancel, MD;  Location: [facility];  Service: Orthopedics;  Laterality: Right;  70 mins     No current facility-administered medications for this visit.   Current Outpatient Medications  Medication Sig Dispense Refill  . apixaban (ELIQUIS) 5 MG TABS tablet Take 1 tablet (5 mg total) by mouth 2 (two) times daily. 60 tablet 1  . diltiazem (CARTIA XT) 240 MG 24 hr capsule Take 1 capsule (240 mg total) by mouth daily. 30 capsule 11  . insulin glargine (LANTUS SOLOSTAR) 100 UNIT/ML Solostar Pen Inject 18 Units into the skin daily. 15 mL 11  . potassium chloride SA (KLOR-CON) 20 MEQ tablet Take 2 tablets (40 mEq total) by mouth daily.     Facility-Administered Medications Ordered in Other Visits  Medication Dose Route Frequency Provider Last Rate Last Admin  . 0.9 %  sodium chloride infusion   Intravenous Continuous Kroeger, Krista M., PA-C      . 0.9 %  sodium chloride infusion  250 mL Intravenous PRN Shirley Friar, PA-C 10 mL/hr at 02/27/20 0753 250 mL at 02/27/20 0753  . acetaminophen (TYLENOL) tablet 650 mg  650 mg Oral Q4H PRN Gala Romney L, MD      . allopurinol (ZYLOPRIM) tablet 300 mg  300 mg Oral Daily Elwyn Reach, MD   300 mg at 02/29/20 0832  . apixaban (ELIQUIS) tablet 5 mg  5 mg Oral BID Constance Haw, MD   5 mg at 02/29/20 2212  . aspirin chewable tablet 81 mg  81 mg Oral Daily Elwyn Reach, MD   81 mg at 02/29/20 0834  . diltiazem (CARDIZEM CD) 24 hr capsule 240 mg  240 mg Oral Daily Wendee Beavers T, MD   240 mg at 02/29/20 0834  .  dofetilide (TIKOSYN) capsule 125 mcg  125 mcg Oral BID Shirley Friar, PA-C   125 mcg at 03/01/20 0900  . DULoxetine (CYMBALTA) DR capsule 60 mg  60 mg Oral Daily Elwyn Reach, MD   60 mg at 02/29/20 0834  . feeding supplement (ENSURE ENLIVE) (ENSURE ENLIVE) liquid 237 mL  237 mL Oral Q24H Wendee Beavers T, MD   237 mL at 02/29/20 2043  . fenofibrate tablet 160 mg  160 mg Oral QAC breakfast Elwyn Reach, MD   160 mg at 02/29/20  0831  . fluticasone (FLONASE) 50 MCG/ACT nasal spray 1 spray  1 spray Each Nare Daily PRN Garba, Mohammad L, MD      . fluticasone furoate-vilanterol (BREO ELLIPTA) 100-25 MCG/INH 1 puff  1 puff Inhalation Daily Gala Romney L, MD   1 puff at 03/01/20 0744   And  . umeclidinium bromide (INCRUSE ELLIPTA) 62.5 MCG/INH 1 puff  1 puff Inhalation Daily Elwyn Reach, MD   1 puff at 03/01/20 0744  . gabapentin (NEURONTIN) capsule 300 mg  300 mg Oral Daily PRN Elwyn Reach, MD   300 mg at 02/21/20 2217  . hydrocortisone (ANUSOL-HC) 2.5 % rectal cream 1 application  1 application Rectal QHS PRN Elwyn Reach, MD      . icosapent Ethyl (VASCEPA) 1 g capsule 2 g  2 g Oral BID Elwyn Reach, MD   2 g at 02/29/20 2211  . insulin aspart (novoLOG) injection 0-5 Units  0-5 Units Subcutaneous QHS Gonfa, Taye T, MD      . insulin aspart (novoLOG) injection 0-9 Units  0-9 Units Subcutaneous TID WC Gonfa, Taye T, MD      . insulin glargine (LANTUS) injection 18 Units  18 Units Subcutaneous QHS Elwyn Reach, MD   18 Units at 02/29/20 2210  . linagliptin (TRADJENTA) tablet 5 mg  5 mg Oral Daily Elwyn Reach, MD   5 mg at 02/29/20 0835  . losartan (COZAAR) tablet 100 mg  100 mg Oral Daily Gala Romney L, MD   100 mg at 02/29/20 0831  . magnesium chloride (SLOW-MAG) 64 MG SR tablet 64 mg  1 tablet Oral Daily Elwyn Reach, MD   64 mg at 02/29/20 0831  . magnesium sulfate IVPB 2 g 50 mL  2 g Intravenous Once Werner Lean, RPH      . montelukast  (SINGULAIR) tablet 10 mg  10 mg Oral QHS Gala Romney L, MD   10 mg at 02/29/20 2211  . multivitamin with minerals tablet 1 tablet  1 tablet Oral Daily Mercy Riding, MD   1 tablet at 02/29/20 0835  . ondansetron (ZOFRAN) injection 4 mg  4 mg Intravenous Q6H PRN Gala Romney L, MD      . pantoprazole (PROTONIX) EC tablet 40 mg  40 mg Oral Daily Elwyn Reach, MD   40 mg at 02/29/20 0835  . potassium chloride SA (KLOR-CON) CR tablet 40 mEq  40 mEq Oral Daily Elwyn Reach, MD   40 mEq at 02/29/20 0833  . QUEtiapine (SEROQUEL) tablet 25 mg  25 mg Oral BID Swayze, Ava, DO   25 mg at 02/29/20 2212  . rosuvastatin (CRESTOR) tablet 40 mg  40 mg Oral Daily Elwyn Reach, MD   40 mg at 02/29/20 0835  . sodium chloride flush (NS) 0.9 % injection 3 mL  3 mL Intravenous Q12H Shirley Friar, PA-C   3 mL at 02/29/20 1531  . sodium chloride flush (NS) 0.9 % injection 3 mL  3 mL Intravenous PRN Shirley Friar, PA-C      . thiamine tablet 100 mg  100 mg Oral Daily Gala Romney L, MD   100 mg at 02/29/20 [id number]    Allergies:   Xarelto [rivaroxaban], Amoxicillin, Augmentin [amoxicillin-pot clavulanate], Azithromycin, Clindamycin/lincomycin, and Keflex [cephalexin]    Social History:  The patient  reports that he quit smoking about 42 years ago. His smoking use included cigarettes. He has a 7.00 pack-year smoking  history. He has never used smokeless tobacco. He reports current alcohol use of about 3.0 - 4.0 standard drinks of alcohol per week. He reports that he does not use drugs.   Family History:  The patient's family history includes Cancer in his father; Colon cancer in his mother; Heart disease in his paternal grandfather and paternal grandmother.    ROS:  Please see the history of present illness.   Otherwise, review of systems are positive for none.   All other systems are reviewed and negative.    PHYSICAL EXAM: VS:  There were no vitals taken for this visit. , BMI  There is no height or weight on file to calculate BMI. GEN: Well nourished, obese, in no acute distress  HEENT: normal  Neck: no JVD, carotid bruits, or masses Cardiac: RRR; no murmurs, rubs, or gallops,no edema  Respiratory:  clear to auscultation bilaterally, normal work of breathing GI: soft, nontender, nondistended, + BS MS: no deformity or atrophy  Skin: warm and dry, no rash Neuro:  Strength and sensation are intact Psych: euthymic mood, full affect   EKG:  EKG is not ordered today. The ekg ordered today demonstrates N/A   Recent Labs: 12/03/2019: Pro B Natriuretic peptide (BNP) 266.0 02/08/2020: TSH 3.137 02/20/2020: ALT 34 02/29/2020: Hemoglobin 13.9; Platelets 266 03/01/2020: BUN 11; Creatinine, Ser 0.72; Magnesium 1.8; Potassium 4.2; Sodium 136    Lipid Panel    Component Value Date/Time   CHOL 108 02/21/2020 0334   CHOL 142 08/08/2019 1058   TRIG 163 (H) 02/21/2020 0334   HDL 20 (L) 02/21/2020 0334   HDL 37 (L) 08/08/2019 1058   CHOLHDL 5.4 02/21/2020 0334   VLDL 33 02/21/2020 0334   LDLCALC 55 02/21/2020 0334   LDLCALC 65 08/08/2019 1058   LDLDIRECT 124.0 06/23/2018 1118      Wt Readings from Last 3 Encounters:  03/01/20 214 lb 15.2 oz (97.5 kg)  02/18/20 237 lb (107.5 kg)  01/23/20 249 lb 11.2 oz (113.3 kg)      Other studies Reviewed: Additional studies/ records that were reviewed today include:  Echo 03/30/18: Study Conclusions  - Left ventricle: The cavity size was normal. Wall thickness was   normal. Systolic function was normal. The estimated ejection   fraction was in the range of 55% to 60%. Wall motion was normal;   there were no regional wall motion abnormalities. - Mitral valve: Mildly calcified annulus. - Left atrium: No evidence of thrombus in the atrial cavity or   appendage. No spontaneous echo contrast was observed. The   appendage was morphologically a left appendage, multilobulated,   and of normal size. Emptying velocity was mildly  reduced. - Right atrium: No evidence of thrombus in the atrial cavity or   appendage. - Atrial septum: There was a very small patent foramen ovale.   Doppler showed a trivial left-to-right shunt, in the baseline   state.  Event monitor 03/07/19: Study Highlights  Sinus rhythm Rare premature ventricular contractions Nonsustained ventricular tachycardia No sustained arrhythmias No atrial fibrillation Baseline artifact limits interpretation at times.   Echo 02/11/20: IMPRESSIONS    1. Technically difficult study with limited views and patient  uncooperative. Grossly normal LV systolic function. Left ventricular  ejection fraction, by estimation, is 60 to 65%. Image quality is not  sufficient to assess for regional wall motiuon  abnormalities. There is mild left ventricular hypertrophy. Left  ventricular diastolic parameters are indeterminate.  2. Right ventricle is pooly visualized but grossly normal size  with  mildly reduced systolic function.  3. The mitral valve is normal in structure. Mild mitral valve  regurgitation.  4. The aortic valve was not well visualized. Aortic valve regurgitation  is not visualized. No aortic stenosis is present.    ASSESSMENT AND PLAN:  1. Thoracic aortic aneurysm: Incidental finding on a chest CT done April 2019, measuring at 43 x 42 mm. Repeat scan in May 2020 showed no change.  He is asymptomatic.  Recommend annual imaging for surveillance.   2.  CAD: Status [name] CABG in 2011 done in [place]. Multiple stents prior to this. He has done well since CABG. No angina.   Last Myoview noted in our records was 2013 and was normal. Continue medical therapy with beta-blocker and statin.  He is not on aspirin due to Xarelto.     3.  Paroxysmal atrial fibrillation: Followed by Dr. Rayann [name]  History of A. fib ablation in June 2019. Now with ILR in place with low Afib burden.  He is currently on Tikosyn, metoprolol and Xarelto.    4.  Obstructive sleep  apnea: Followed in sleep clinic by Dr. Radford [name].  He reports that he is noncompliant with CPAP due to inability to tolerate device.  5. DM: management per PCP. He is on [place]. Stressed importance of low carb diet.   6. HTN. Currently well controlled.  7. Mixed HLD. LDL at goal on statin. Triglycerides elevated. On Vascepa. Continue Rx.    Disposition:   FU with me in 6 months  Signed, Cephas Revard Martinique, MD  03/01/2020 12:36 PM    [facility] [address] [place], [place], [address] Phone [phone], Fax [phone]

## 2020-03-01 NOTE — Progress Notes (Signed)
PROGRESS NOTE  Adam [name] [id number] DOB: 04-19-1951   PCP: [name], MD  Patient is from: Home  DOA: 02/20/2020 LOS: 58  Brief Narrative / Interim history: 69 year old male with history of DM-2, CAD, A. Fib s/p ablation and DCCV, COPD, HTN, HLD, OSA not on CPAP, anxiety and depression admitted for atrial fibrillation with RVR.  Has been on Tikosyn and Cardizem drip per cardiology.  However, he was noted to have gradually progressive prolonged QTc.  Tikosyn was put on hold.  Initially, there was plan for TEE and DCCV early next week.  However, given improvement in his heart rate he was eventually transitioned to p.o. Cardizem CD.  Tikosyn resumed as well.  QTc remained stable at about 470.    Patient remained debilitated with significant tachycardia and orthostatic hypotension limiting his participation in therapy.   Subjective: Seen and examined earlier this morning.  No major events overnight of this morning.  Looks sleepy and lethargic although he is oriented x4 except date.  Feels weak and tired.  He denies chest pain, dyspnea, GI or UTI symptoms.  Objective: Vitals:   03/01/20 0039 03/01/20 0528 03/01/20 0744 03/01/20 0840  BP: 126/84   (!) 138/95  Pulse:    98  Resp: 19     Temp: 98 F (36.7 C) 98 F (36.7 C)  98 F (36.7 C)  TempSrc: Oral Oral  Oral  SpO2: 94% 95% 96%   Weight:  97.5 kg      Intake/Output Summary (Last 24 hours) at 03/01/2020 1226 Last data filed at 03/01/2020 0500 Gross per 24 hour  Intake --  Output 300 ml  Net -300 ml   Filed Weights   02/28/20 0429 02/29/20 0532 03/01/20 0528  Weight: 96.6 kg 96.5 kg 97.5 kg    Examination:  GENERAL: Drowsy, weak and lethargic. HEENT: MMM.  Vision and hearing grossly intact.  NECK: Supple.  No apparent JVD.  RESP: 96% on RA.  No IWOB.  Fair aeration bilaterally. CVS: Irregular.  HR 90-100. Heart sounds normal.  ABD/GI/GU: BS+. Abd soft, NTND.  MSK/EXT:  Moves extremities. No apparent  deformity. No edema.  SKIN: no apparent skin lesion or wound NEURO: Awake but not alert.  Oriented x4 except date.  No apparent focal neuro deficit. PSYCH: Somewhat drowsy and lethargic.  Procedures:  None  Microbiology summarized: COVID-19 PCR negative. Blood culture negative (only 1 bottle).  Assessment & Plan: A. fib with RVR: PPM in 07/2019. HR 90-100 at rest.  He becomes diaphoretic and lightheaded with significant RVR with minimal exertion.  TSH within normal.  Echo with EF of 60 to 65% and mild LVH.  -Continue p.o. Tikosyn and Eliquis.  Increased p.o. Cardizem CD to 240 mg daily on 5/28 -No plan for TEE/DCCV at this point -Check a.m. cortisol  Orthostatic hypotension: Significant symptomatic orthostasis while trying to work with PT. TSH and echo as above. -TED hose, OOB for meals, PT/OT -Check a.m. cortisol  Prolonged QTc: was 485 on EKG but adjusts to 470s for tachycardia -Closely monitor and optimize electrolytes -EP signed off.  DM-2 with hyperglycemia and hyperlipidemia Recent Labs    02/29/20 2123 03/01/20 0951 03/01/20 1202  GLUCAP 172* 176* 178*  -Add SSI-thin -Continue Lantus.  History of CAD: No anginal symptoms. -Continue home medications  Essential hypertension: Normotensive -Continue Cardizem as above  Chronic COPD -Continue LABA/LAMA/ICS with as needed nebulizers  OSA: -Continue nightly CPAP  Debility/deconditioning: Diaphoretic and lightheaded with significant RVR with minimal exertion  limiting his participation in therapy.  He is somewhat drowsy and lethargic this morning.  TSH and ammonia was normal. -Continue TED hose/OOB/PT/OT -Check a.m. cortisol   Nutrition Problem: Severe Malnutrition Etiology: acute illness(recent Werneke's encephalopathy) Signs/Symptoms: moderate muscle depletion, moderate fat depletion, percent weight loss(16% weight loss within 3 months) Percent weight loss: 16 % Interventions: Ensure Enlive (each supplement  provides 350kcal and 20 grams of protein), MVI Pressure Injury 02/20/20 Sacrum Medial Stage 2 -  Partial thickness loss of dermis presenting as a shallow open injury with a red, pink wound bed without slough. (Active)  02/20/20 2249  Location: Sacrum  Location Orientation: Medial  Staging: Stage 2 -  Partial thickness loss of dermis presenting as a shallow open injury with a red, pink wound bed without slough.  Wound Description (Comments):   Present on Admission: Yes   DVT prophylaxis: On Eliquis for A. fib Code Status: DNR/DNI Family Communication: Patient and/or RN. Available if any question.  Status is: Inpatient  Remains inpatient appropriate because:Inpatient level of care appropriate due to severity of illness  Hemodynamically unstable.  Patient with significant orthostasis limiting his ability to participate in therapy   Dispo: The patient is from: Home              Anticipated d/c is to: SNF              Anticipated d/c date is: 3 days              Patient currently is not medically stable to d/c.        Consultants:  Electrophysiology   Sch Meds:  Scheduled Meds: . allopurinol  300 mg Oral Daily  . apixaban  5 mg Oral BID  . aspirin  81 mg Oral Daily  . diltiazem  240 mg Oral Daily  . dofetilide  125 mcg Oral BID  . DULoxetine  60 mg Oral Daily  . feeding supplement (ENSURE ENLIVE)  237 mL Oral Q24H  . fenofibrate  160 mg Oral QAC breakfast  . fluticasone furoate-vilanterol  1 puff Inhalation Daily   And  . umeclidinium bromide  1 puff Inhalation Daily  . icosapent Ethyl  2 g Oral BID  . insulin aspart  0-5 Units Subcutaneous QHS  . insulin aspart  0-9 Units Subcutaneous TID WC  . insulin glargine  18 Units Subcutaneous QHS  . linagliptin  5 mg Oral Daily  . losartan  100 mg Oral Daily  . magnesium chloride  1 tablet Oral Daily  . montelukast  10 mg Oral QHS  . multivitamin with minerals  1 tablet Oral Daily  . pantoprazole  40 mg Oral Daily  .  potassium chloride  40 mEq Oral Daily  . QUEtiapine  25 mg Oral BID  . rosuvastatin  40 mg Oral Daily  . sodium chloride flush  3 mL Intravenous Q12H  . thiamine  100 mg Oral Daily   Continuous Infusions: . sodium chloride    . sodium chloride 250 mL (02/27/20 0753)  . magnesium sulfate bolus IVPB     PRN Meds:.sodium chloride, acetaminophen, fluticasone, gabapentin, hydrocortisone, ondansetron (ZOFRAN) IV, sodium chloride flush  Antimicrobials: Anti-infectives (From admission, onward)   None       I have personally reviewed the following labs and images: CBC: Recent Labs  Lab 02/29/20 0309  WBC 7.4  HGB 13.9  HCT 41.9  MCV 94.4  PLT 266   BMP &GFR Recent Labs  Lab 02/26/20 0430 02/27/20 0453  02/28/20 0340 02/29/20 0309 03/01/20 0827  NA 138 137 135 136 136  K 4.7 4.0 4.1 4.7 4.2  CL 106 105 105 102 103  CO2 21* 23 21* 24 23  GLUCOSE 147* 149* 140* 172* 196*  BUN 6* 6* 7* 9 11  CREATININE 0.84 0.84 0.77 0.86 0.72  CALCIUM 9.8 10.3 10.1 10.5* 10.5*  MG 2.0 1.9 1.9 1.9 1.8  PHOS  --   --   --  4.3 3.4   Estimated Creatinine Clearance: 102.1 mL/min (by C-G formula based on SCr of 0.72 mg/dL). Liver & Pancreas: Recent Labs  Lab 02/29/20 0309 03/01/20 0827  ALBUMIN 3.5 3.5   No results for input(s): LIPASE, AMYLASE in the last 168 hours. Recent Labs  Lab 03/01/20 1108  AMMONIA 28   Diabetic: Recent Labs    03/01/20 0847  HGBA1C 6.9*   Recent Labs  Lab 02/26/20 2200 02/27/20 2237 02/29/20 2123 03/01/20 0951 03/01/20 1202  GLUCAP 153* 150* 172* 176* 178*   Cardiac Enzymes: No results for input(s): CKTOTAL, CKMB, CKMBINDEX, TROPONINI in the last 168 hours. Recent Labs    12/03/19 1502  PROBNP 266.0*   Coagulation Profile: Recent Labs  Lab 02/25/20 0033  INR 1.3*   Thyroid Function Tests: No results for input(s): TSH, T4TOTAL, FREET4, T3FREE, THYROIDAB in the last 72 hours. Lipid Profile: No results for input(s): CHOL, HDL,  LDLCALC, TRIG, CHOLHDL, LDLDIRECT in the last 72 hours. Anemia Panel: No results for input(s): VITAMINB12, FOLATE, FERRITIN, TIBC, IRON, RETICCTPCT in the last 72 hours. Urine analysis:    Component Value Date/Time   COLORURINE AMBER (A) 02/20/2020 1957   APPEARANCEUR HAZY (A) 02/20/2020 1957   LABSPEC 1.020 02/20/2020 1957   PHURINE 5.0 02/20/2020 1957   GLUCOSEU 50 (A) 02/20/2020 1957   HGBUR NEGATIVE 02/20/2020 1957   BILIRUBINUR NEGATIVE 02/20/2020 1957   BILIRUBINUR 1+ 08/14/2013 1200   KETONESUR NEGATIVE 02/20/2020 1957   PROTEINUR 30 (A) 02/20/2020 1957   UROBILINOGEN 0.2 08/14/2013 1200   NITRITE NEGATIVE 02/20/2020 1957   LEUKOCYTESUR TRACE (A) 02/20/2020 1957   Sepsis Labs: Invalid input(s): PROCALCITONIN, [place]  Microbiology: Recent Results (from the past 240 hour(s))  SARS Coronavirus 2 by RT PCR (hospital order, performed in [facility] hospital lab) Nasopharyngeal Nasopharyngeal Swab     Status: None   Collection Time: 02/20/20  6:38 PM   Specimen: Nasopharyngeal Swab  Result Value Ref Range Status   SARS Coronavirus 2 NEGATIVE NEGATIVE Final    Comment: (NOTE) SARS-CoV-2 target nucleic acids are NOT DETECTED. The SARS-CoV-2 RNA is generally detectable in upper and lower respiratory specimens during the acute phase of infection. The lowest concentration of SARS-CoV-2 viral copies this assay can detect is 250 copies / mL. A negative result does not preclude SARS-CoV-2 infection and should not be used as the sole basis for treatment or other patient management decisions.  A negative result may occur with improper specimen collection / handling, submission of specimen other than nasopharyngeal swab, presence of viral mutation(s) within the areas targeted by this assay, and inadequate number of viral copies (<250 copies / mL). A negative result must be combined with clinical observations, patient history, and epidemiological information. Fact Sheet for  Patients:   StrictlyIdeas.no Fact Sheet for Healthcare Providers: BankingDealers.co.za This test is not yet approved or cleared  by the [place] FDA and has been authorized for detection and/or diagnosis of SARS-CoV-2 by FDA under an Emergency Use Authorization (EUA).  This EUA will remain in  effect (meaning this test can be used) for the duration of the COVID-19 declaration under Section 564(b)(1) of the Act, 21 U.S.C. section 360bbb-3(b)(1), unless the authorization is terminated or revoked sooner. Performed at [facility], [place] [address] [place], [place] [address]   Culture, blood (single) w Reflex to ID Panel     Status: None   Collection Time: 02/20/20  7:34 PM   Specimen: BLOOD  Result Value Ref Range Status   Specimen Description BLOOD LEFT ANTECUBITAL  Final   Special Requests   Final    BOTTLES DRAWN AEROBIC AND ANAEROBIC Blood Culture adequate volume   Culture   Final    NO GROWTH 5 DAYS Performed at [facility], [place] [address], [place], [place] [address]    Report Status 02/25/2020 FINAL  Final  SARS CORONAVIRUS 2 (TAT 6-24 HRS) Nasopharyngeal Nasopharyngeal Swab     Status: None   Collection Time: 02/27/20 11:33 AM   Specimen: Nasopharyngeal Swab  Result Value Ref Range Status   SARS Coronavirus 2 NEGATIVE NEGATIVE Final    Comment: (NOTE) SARS-CoV-2 target nucleic acids are NOT DETECTED. The SARS-CoV-2 RNA is generally detectable in upper and lower respiratory specimens during the acute phase of infection. Negative results do not preclude SARS-CoV-2 infection, do not rule out co-infections with other pathogens, and should not be used as the sole basis for treatment or other patient management decisions. Negative results must be combined with clinical observations, patient history, and epidemiological information. The expected result is Negative. Fact Sheet for  Patients: SugarRoll.be Fact Sheet for Healthcare Providers: https://www.woods-mathews.com/ This test is not yet approved or cleared by the [place] FDA and  has been authorized for detection and/or diagnosis of SARS-CoV-2 by FDA under an Emergency Use Authorization (EUA). This EUA will remain  in effect (meaning this test can be used) for the duration of the COVID-19 declaration under Section 56 4(b)(1) of the Act, 21 U.S.C. section 360bbb-3(b)(1), unless the authorization is terminated or revoked sooner. Performed at [facility], [place] [address] [place], [place] [address]     Radiology Studies: No results found.    Mariena Meares T. [place]  If 7PM-7AM, please contact night-coverage [email or web address] Password [facility] 03/01/2020, 12:26 PM

## 2020-03-02 DIAGNOSIS — I951 Orthostatic hypotension: Secondary | ICD-10-CM

## 2020-03-02 LAB — RENAL FUNCTION PANEL
Albumin: 3.6 g/dL (ref 3.5–5.0)
Anion gap: 8 (ref 5–15)
BUN: 9 mg/dL (ref 8–23)
CO2: 27 mmol/L (ref 22–32)
Calcium: 10.5 mg/dL — ABNORMAL HIGH (ref 8.9–10.3)
Chloride: 102 mmol/L (ref 98–111)
Creatinine, Ser: 0.86 mg/dL (ref 0.61–1.24)
GFR calc Af Amer: >60 mL/min (ref >60)
GFR calc non Af Amer: >60 mL/min (ref >60)
Glucose, Bld: 177 mg/dL — ABNORMAL HIGH (ref 70–99)
Phosphorus: 3.9 mg/dL (ref 2.5–4.6)
Potassium: 4.7 mmol/L (ref 3.5–5.1)
Sodium: 137 mmol/L (ref 135–145)

## 2020-03-02 LAB — MAGNESIUM: Magnesium: 2 mg/dL (ref 1.7–2.4)

## 2020-03-02 LAB — CORTISOL-AM, BLOOD: Cortisol - AM: 13.6 ug/dL (ref 6.7–22.6)

## 2020-03-02 LAB — GLUCOSE, CAPILLARY
Glucose-Capillary: 158 mg/dL — ABNORMAL HIGH (ref 70–99)
Glucose-Capillary: 184 mg/dL — ABNORMAL HIGH (ref 70–99)
Glucose-Capillary: 132 mg/dL — ABNORMAL HIGH (ref 70–99)
Glucose-Capillary: 180 mg/dL — ABNORMAL HIGH (ref 70–99)

## 2020-03-02 MED ORDER — MAGNESIUM SULFATE IN D5W 1-5 GM/100ML-% IV SOLN
1.0000 g | Freq: Once | INTRAVENOUS | Status: AC
  Administered 2020-03-02: 1 g via INTRAVENOUS
  Filled 2020-03-02: qty 100

## 2020-03-02 MED ORDER — QUETIAPINE FUMARATE 25 MG PO TABS: 25.0000 mg | ORAL_TABLET | Freq: Every day | ORAL | Status: DC

## 2020-03-02 MED ORDER — POTASSIUM CHLORIDE CRYS ER 20 MEQ PO TBCR
20.0000 meq | EXTENDED_RELEASE_TABLET | Freq: Every day | ORAL | Status: DC
  Administered 2020-03-02 – 2020-03-03 (×2): 20 meq via ORAL
  Filled 2020-03-02 (×2): qty 1

## 2020-03-02 NOTE — Progress Notes (Signed)
Patient to get oob to chair, assisted to side of bed. Patient unable to hold an upright position on side of bed or come to a standing position.  C/o dizziness, HR 116 bpm, ST, BP 110/82.  Patient returned to laying position, HR returned to less than 100 bpm.

## 2020-03-02 NOTE — Progress Notes (Signed)
PROGRESS NOTE  Adam [name] [id number] DOB: 1951/03/30   PCP: [name], MD  Patient is from: Home  DOA: 02/20/2020 LOS: 55  Brief Narrative / Interim history: 69 year old male with history of DM-2, CAD, A. Fib s/p ablation and DCCV, COPD, HTN, HLD, OSA not on CPAP, anxiety and depression admitted for atrial fibrillation with RVR.  Has been on Tikosyn and Cardizem drip per cardiology.  However, he was noted to have gradually progressive prolonged QTc.  Tikosyn was put on hold.  Initially, there was plan for TEE and DCCV early next week.  However, given improvement in his heart rate he was eventually transitioned to p.o. Cardizem CD.  Tikosyn resumed as well.  QTc remained stable at about 470.    Patient remained debilitated with significant tachycardia and orthostatic hypotension limiting his participation in therapy.   Subjective: Seen and examined earlier this morning.  No major events overnight of this morning.  No complaints.  Denies chest pain, dyspnea, palpitation, GI or UTI symptoms.  Denies dizziness at rest but has not gotten out of bed to know how he feels when he moves.  Objective: Vitals:   03/02/20 0758 03/02/20 0800 03/02/20 0938 03/02/20 1106  BP: 134/86  120/60   Pulse: 89     Resp: 18     Temp: 97.7 F (36.5 C)     TempSrc: Oral     SpO2: 96% 98%    Weight:      Height:    5\' 10"  (1.778 m)    Intake/Output Summary (Last 24 hours) at 03/02/2020 1247 Last data filed at 03/02/2020 1200 Gross per 24 hour  Intake 391.52 ml  Output 1250 ml  Net -858.48 ml   Filed Weights   02/29/20 0532 03/01/20 0528 03/02/20 0531  Weight: 96.5 kg 97.5 kg 98.1 kg    Examination:  GENERAL: No apparent distress.  Nontoxic. HEENT: MMM.  Vision and hearing grossly intact.  NECK: Supple.  No apparent JVD.  RESP: 95% on RA.  No IWOB.  Fair aeration bilaterally. CVS: 90 to 100/min.Marland Kitchen Heart sounds normal.  ABD/GI/GU: BS+. Abd soft, NTND.  MSK/EXT:  Moves  extremities. No apparent deformity. No edema.  SKIN: no apparent skin lesion or wound NEURO: Awake, alert and oriented appropriately.  No apparent focal neuro deficit. PSYCH: Calm. Normal affect.  Procedures:  None  Microbiology summarized: COVID-19 PCR negative. Blood culture negative (only 1 bottle).  Assessment & Plan: A. fib with RVR: PPM in 07/2019. HR 90-100 at rest.  He becomes diaphoretic and lightheaded with significant RVR with minimal exertion.  TSH within normal.  Echo with EF of 60 to 65% and mild LVH.  -Continue p.o. Tikosyn and Eliquis.  Increased p.o. Cardizem CD to 240 mg daily on 5/28 -No plan for TEE/DCCV at this point -Check a.m. cortisol  Orthostatic hypotension: Significant symptomatic orthostasis while trying to work with PT. TSH and echo as above. -TED hose, OOB for meals, PT/OT -Check a.m. cortisol  Prolonged QTc: was 485 on EKG but adjusts to 470s for tachycardia -Closely monitor and optimize electrolytes -EP signed off.  DM-2 with hyperglycemia and hyperlipidemia Recent Labs    03/01/20 2202 03/02/20 0756 03/02/20 1121  GLUCAP 168* 158* 184*  -Continue SSI-10. -Continue Lantus.  History of CAD: No anginal symptoms. -Continue home medications  Essential hypertension: Normotensive -Continue Cardizem as above  Chronic COPD -Continue LABA/LAMA/ICS with as needed nebulizers  OSA: -Continue nightly CPAP  History of PTSD: Stable. -Continue Cymbalta -Reduce  Seroquel from twice daily to at bedtime.  Debility/deconditioning: Diaphoretic and lightheaded with significant RVR with minimal exertion limiting his participation in therapy.  TSH, ammonia and a.m. cortisol within normal. -Continue TED hose/OOB/PT/OT -Reduce Seroquel 25 mg from twice daily to at bedtime  Nutrition Problem: Severe Malnutrition Etiology: acute illness(recent Werneke's encephalopathy) Signs/Symptoms: moderate muscle depletion, moderate fat depletion, percent weight  loss(16% weight loss within 3 months) Percent weight loss: 16 % Interventions: Ensure Enlive (each supplement provides 350kcal and 20 grams of protein), MVI Pressure Injury 02/20/20 Sacrum Medial Stage 2 -  Partial thickness loss of dermis presenting as a shallow open injury with a red, pink wound bed without slough. (Active)  02/20/20 2249  Location: Sacrum  Location Orientation: Medial  Staging: Stage 2 -  Partial thickness loss of dermis presenting as a shallow open injury with a red, pink wound bed without slough.  Wound Description (Comments):   Present on Admission: Yes   DVT prophylaxis: On Eliquis for A. fib Code Status: DNR/DNI Family Communication: Patient and/or RN. Available if any question.  Status is: Inpatient  Remains inpatient appropriate because:Inpatient level of care appropriate due to severity of illness  Hemodynamically unstable.  Patient with significant orthostasis limiting his ability to participate in therapy   Dispo: The patient is from: Home              Anticipated d/c is to: SNF              Anticipated d/c date is: 2 days              Patient currently is not medically stable to d/c.        Consultants:  Electrophysiology   Sch Meds:  Scheduled Meds: . allopurinol  300 mg Oral Daily  . apixaban  5 mg Oral BID  . aspirin  81 mg Oral Daily  . diltiazem  240 mg Oral Daily  . dofetilide  125 mcg Oral BID  . DULoxetine  60 mg Oral Daily  . feeding supplement (ENSURE ENLIVE)  237 mL Oral Q24H  . fenofibrate  160 mg Oral QAC breakfast  . fluticasone furoate-vilanterol  1 puff Inhalation Daily   And  . umeclidinium bromide  1 puff Inhalation Daily  . icosapent Ethyl  2 g Oral BID  . insulin aspart  0-5 Units Subcutaneous QHS  . insulin aspart  0-9 Units Subcutaneous TID WC  . insulin glargine  18 Units Subcutaneous QHS  . linagliptin  5 mg Oral Daily  . losartan  100 mg Oral Daily  . magnesium chloride  1 tablet Oral Daily  . montelukast   10 mg Oral QHS  . multivitamin with minerals  1 tablet Oral Daily  . pantoprazole  40 mg Oral Daily  . potassium chloride  20 mEq Oral Daily  . QUEtiapine  25 mg Oral BID  . rosuvastatin  40 mg Oral Daily  . sodium chloride flush  3 mL Intravenous Q12H  . thiamine  100 mg Oral Daily   Continuous Infusions: . sodium chloride    . sodium chloride Stopped (03/02/20 0823)   PRN Meds:.sodium chloride, acetaminophen, fluticasone, gabapentin, hydrocortisone, ondansetron (ZOFRAN) IV, sodium chloride flush  Antimicrobials: Anti-infectives (From admission, onward)   None       I have personally reviewed the following labs and images: CBC: Recent Labs  Lab 02/29/20 0309  WBC 7.4  HGB 13.9  HCT 41.9  MCV 94.4  PLT 266   BMP &GFR  Recent Labs  Lab 02/27/20 0453 02/28/20 0340 02/29/20 0309 03/01/20 0827 03/02/20 0418  NA 137 135 136 136 137  K 4.0 4.1 4.7 4.2 4.7  CL 105 105 102 103 102  CO2 23 21* 24 23 27   GLUCOSE 149* 140* 172* 196* 177*  BUN 6* 7* 9 11 9   CREATININE 0.84 0.77 0.86 0.72 0.86  CALCIUM 10.3 10.1 10.5* 10.5* 10.5*  MG 1.9 1.9 1.9 1.8 2.0  PHOS  --   --  4.3 3.4 3.9   Estimated Creatinine Clearance: 95.2 mL/min (by C-G formula based on SCr of 0.86 mg/dL). Liver & Pancreas: Recent Labs  Lab 02/29/20 0309 03/01/20 0827 03/02/20 0418  ALBUMIN 3.5 3.5 3.6   No results for input(s): LIPASE, AMYLASE in the last 168 hours. Recent Labs  Lab 03/01/20 1108  AMMONIA 28   Diabetic: Recent Labs    03/01/20 0847  HGBA1C 6.9*   Recent Labs  Lab 03/01/20 1202 03/01/20 1654 03/01/20 2202 03/02/20 0756 03/02/20 1121  GLUCAP 178* 154* 168* 158* 184*   Cardiac Enzymes: No results for input(s): CKTOTAL, CKMB, CKMBINDEX, TROPONINI in the last 168 hours. Recent Labs    12/03/19 1502  PROBNP 266.0*   Coagulation Profile: Recent Labs  Lab 02/25/20 0033  INR 1.3*   Thyroid Function Tests: No results for input(s): TSH, T4TOTAL, FREET4, T3FREE,  THYROIDAB in the last 72 hours. Lipid Profile: No results for input(s): CHOL, HDL, LDLCALC, TRIG, CHOLHDL, LDLDIRECT in the last 72 hours. Anemia Panel: No results for input(s): VITAMINB12, FOLATE, FERRITIN, TIBC, IRON, RETICCTPCT in the last 72 hours. Urine analysis:    Component Value Date/Time   COLORURINE AMBER (A) 02/20/2020 1957   APPEARANCEUR HAZY (A) 02/20/2020 1957   LABSPEC 1.020 02/20/2020 1957   PHURINE 5.0 02/20/2020 1957   GLUCOSEU 50 (A) 02/20/2020 1957   HGBUR NEGATIVE 02/20/2020 1957   BILIRUBINUR NEGATIVE 02/20/2020 1957   BILIRUBINUR 1+ 08/14/2013 1200   KETONESUR NEGATIVE 02/20/2020 1957   PROTEINUR 30 (A) 02/20/2020 1957   UROBILINOGEN 0.2 08/14/2013 1200   NITRITE NEGATIVE 02/20/2020 1957   LEUKOCYTESUR TRACE (A) 02/20/2020 1957   Sepsis Labs: Invalid input(s): PROCALCITONIN, [place]  Microbiology: Recent Results (from the past 240 hour(s))  SARS CORONAVIRUS 2 (TAT 6-24 HRS) Nasopharyngeal Nasopharyngeal Swab     Status: None   Collection Time: 02/27/20 11:33 AM   Specimen: Nasopharyngeal Swab  Result Value Ref Range Status   SARS Coronavirus 2 NEGATIVE NEGATIVE Final    Comment: (NOTE) SARS-CoV-2 target nucleic acids are NOT DETECTED. The SARS-CoV-2 RNA is generally detectable in upper and lower respiratory specimens during the acute phase of infection. Negative results do not preclude SARS-CoV-2 infection, do not rule out co-infections with other pathogens, and should not be used as the sole basis for treatment or other patient management decisions. Negative results must be combined with clinical observations, patient history, and epidemiological information. The expected result is Negative. Fact Sheet for Patients: SugarRoll.be Fact Sheet for Healthcare Providers: https://www.woods-mathews.com/ This test is not yet approved or cleared by the [place] FDA and  has been authorized for detection  and/or diagnosis of SARS-CoV-2 by FDA under an Emergency Use Authorization (EUA). This EUA will remain  in effect (meaning this test can be used) for the duration of the COVID-19 declaration under Section 56 4(b)(1) of the Act, 21 U.S.C. section 360bbb-3(b)(1), unless the authorization is terminated or revoked sooner. Performed at [facility], [name] [address] [place], [place] [address]  Radiology Studies: No results found.    Nikolaj Geraghty T. [place]  If 7PM-7AM, please contact night-coverage [email or web address] Password [facility] 03/02/2020, 12:47 PM

## 2020-03-02 NOTE — Progress Notes (Signed)
Pharmacy: Dofetilide (Tikosyn) - Follow Up Assessment and Electrolyte Replacement  Pharmacy consulted to assist in monitoring and replacing electrolytes in this 69 y.o. male admitted on 02/20/2020 undergoing dofetilide re-initiation.   Labs:    Component Value Date/Time   K 4.7 03/02/2020 0418   MG 2.0 03/02/2020 0418     Plan: Potassium: K >/= 4: No additional supplementation needed  Since K+ increased from 4.2 to 4.7, changed daily K-dur supplementation from 40 mEq daily to 20 mEq  Magnesium: Mg 1.8-2: Give Mg 2 gm IV x1   Recommend discharging patient with prescription for: -Potassium chloride 20 mEq  daily   Sherren Kerns, PharmD PGY1 Acute Care Pharmacy Resident **Pharmacist phone directory can now be found on [email or web address] ([email or web address]).  Listed under [place]. 03/02/2020  7:57 AM

## 2020-03-03 DIAGNOSIS — Z9181 History of falling: Secondary | ICD-10-CM | POA: Diagnosis not present

## 2020-03-03 DIAGNOSIS — E43 Unspecified severe protein-calorie malnutrition: Secondary | ICD-10-CM | POA: Diagnosis not present

## 2020-03-03 DIAGNOSIS — Z7401 Bed confinement status: Secondary | ICD-10-CM | POA: Diagnosis not present

## 2020-03-03 DIAGNOSIS — E1169 Type 2 diabetes mellitus with other specified complication: Secondary | ICD-10-CM

## 2020-03-03 DIAGNOSIS — I119 Hypertensive heart disease without heart failure: Secondary | ICD-10-CM | POA: Diagnosis not present

## 2020-03-03 DIAGNOSIS — E119 Type 2 diabetes mellitus without complications: Secondary | ICD-10-CM | POA: Diagnosis not present

## 2020-03-03 DIAGNOSIS — M2550 Pain in unspecified joint: Secondary | ICD-10-CM | POA: Diagnosis not present

## 2020-03-03 DIAGNOSIS — F4310 Post-traumatic stress disorder, unspecified: Secondary | ICD-10-CM

## 2020-03-03 DIAGNOSIS — R1312 Dysphagia, oropharyngeal phase: Secondary | ICD-10-CM | POA: Diagnosis not present

## 2020-03-03 DIAGNOSIS — R2681 Unsteadiness on feet: Secondary | ICD-10-CM | POA: Diagnosis not present

## 2020-03-03 DIAGNOSIS — R29898 Other symptoms and signs involving the musculoskeletal system: Secondary | ICD-10-CM | POA: Diagnosis not present

## 2020-03-03 DIAGNOSIS — I4891 Unspecified atrial fibrillation: Secondary | ICD-10-CM | POA: Diagnosis not present

## 2020-03-03 DIAGNOSIS — I480 Paroxysmal atrial fibrillation: Secondary | ICD-10-CM | POA: Diagnosis not present

## 2020-03-03 DIAGNOSIS — F1027 Alcohol dependence with alcohol-induced persisting dementia: Secondary | ICD-10-CM | POA: Diagnosis not present

## 2020-03-03 DIAGNOSIS — I10 Essential (primary) hypertension: Secondary | ICD-10-CM | POA: Diagnosis not present

## 2020-03-03 DIAGNOSIS — R2689 Other abnormalities of gait and mobility: Secondary | ICD-10-CM | POA: Diagnosis not present

## 2020-03-03 DIAGNOSIS — E512 Wernicke's encephalopathy: Secondary | ICD-10-CM | POA: Diagnosis not present

## 2020-03-03 DIAGNOSIS — I951 Orthostatic hypotension: Secondary | ICD-10-CM | POA: Diagnosis not present

## 2020-03-03 DIAGNOSIS — R41841 Cognitive communication deficit: Secondary | ICD-10-CM | POA: Diagnosis not present

## 2020-03-03 DIAGNOSIS — R9431 Abnormal electrocardiogram [ECG] [EKG]: Secondary | ICD-10-CM | POA: Diagnosis not present

## 2020-03-03 DIAGNOSIS — I499 Cardiac arrhythmia, unspecified: Secondary | ICD-10-CM | POA: Diagnosis not present

## 2020-03-03 DIAGNOSIS — M6258 Muscle wasting and atrophy, not elsewhere classified, other site: Secondary | ICD-10-CM | POA: Diagnosis not present

## 2020-03-03 DIAGNOSIS — I2510 Atherosclerotic heart disease of native coronary artery without angina pectoris: Secondary | ICD-10-CM | POA: Diagnosis not present

## 2020-03-03 DIAGNOSIS — E878 Other disorders of electrolyte and fluid balance, not elsewhere classified: Secondary | ICD-10-CM | POA: Diagnosis not present

## 2020-03-03 DIAGNOSIS — E785 Hyperlipidemia, unspecified: Secondary | ICD-10-CM

## 2020-03-03 DIAGNOSIS — M6281 Muscle weakness (generalized): Secondary | ICD-10-CM | POA: Diagnosis not present

## 2020-03-03 DIAGNOSIS — J441 Chronic obstructive pulmonary disease with (acute) exacerbation: Secondary | ICD-10-CM | POA: Diagnosis not present

## 2020-03-03 LAB — RENAL FUNCTION PANEL
Albumin: 3.3 g/dL — ABNORMAL LOW (ref 3.5–5.0)
Anion gap: 8 (ref 5–15)
BUN: 13 mg/dL (ref 8–23)
CO2: 25 mmol/L (ref 22–32)
Calcium: 10.2 mg/dL (ref 8.9–10.3)
Chloride: 102 mmol/L (ref 98–111)
Creatinine, Ser: 0.96 mg/dL (ref 0.61–1.24)
GFR calc Af Amer: >60 mL/min (ref >60)
GFR calc non Af Amer: >60 mL/min (ref >60)
Glucose, Bld: 203 mg/dL — ABNORMAL HIGH (ref 70–99)
Phosphorus: 4 mg/dL (ref 2.5–4.6)
Potassium: 3.9 mmol/L (ref 3.5–5.1)
Sodium: 135 mmol/L (ref 135–145)

## 2020-03-03 LAB — GLUCOSE, CAPILLARY
Glucose-Capillary: 163 mg/dL — ABNORMAL HIGH (ref 70–99)
Glucose-Capillary: 207 mg/dL — ABNORMAL HIGH (ref 70–99)

## 2020-03-03 LAB — MAGNESIUM: Magnesium: 1.8 mg/dL (ref 1.7–2.4)

## 2020-03-03 LAB — SARS CORONAVIRUS 2 BY RT PCR (HOSPITAL ORDER, PERFORMED IN ~~LOC~~ HOSPITAL LAB): SARS Coronavirus 2: NEGATIVE

## 2020-03-03 MED ORDER — MAGNESIUM SULFATE 2 GM/50ML IV SOLN: 2.0000 g | Freq: Once | INTRAVENOUS | Status: AC

## 2020-03-03 MED ORDER — POTASSIUM CHLORIDE CRYS ER 20 MEQ PO TBCR
40.0000 meq | EXTENDED_RELEASE_TABLET | Freq: Once | ORAL | Status: AC
  Administered 2020-03-03: 40 meq via ORAL
  Filled 2020-03-03: qty 2

## 2020-03-03 MED ORDER — MAGNESIUM SULFATE 2 GM/50ML IV SOLN
2.0000 g | Freq: Once | INTRAVENOUS | Status: AC
  Administered 2020-03-03: 2 g via INTRAVENOUS
  Filled 2020-03-03: qty 50

## 2020-03-03 MED ORDER — ENSURE ENLIVE PO LIQD: 237.0000 mL | ORAL | 12 refills | Status: DC

## 2020-03-03 MED ORDER — ADULT MULTIVITAMIN W/MINERALS CH: 1.0000 | ORAL_TABLET | Freq: Every day | ORAL | Status: DC

## 2020-03-03 MED ORDER — POTASSIUM CHLORIDE CRYS ER 20 MEQ PO TBCR: 20.0000 meq | EXTENDED_RELEASE_TABLET | Freq: Every day | ORAL | Status: DC

## 2020-03-03 NOTE — Discharge Summary (Signed)
Physician Discharge Summary  Adam [name] [id number] DOB: 05/13/1951 DOA: 02/20/2020  PCP: [name], MD  Admit date: 02/20/2020 Discharge date: 03/03/2020  Admitted From: Home Disposition: SNF  Recommendations for Outpatient Follow-up:  1. Follow ups as below. 2. Please obtain CBC/BMP/Mag in 1 week 3. Please follow up on the following pending results: None  Discharge Condition: Stable but guarded prognosis CODE STATUS: Full code  Follow-up Information    [facility] Follow up on 03/17/2020.   Specialty: Cardiology Why: at 0930 am for post hospital follow up after tikosyn re-initiation. Contact information: [address] [id number] [place] [address] [phone]          Hospital Course: 69 year old male with history of DM-2, CAD, A. Fib s/p ablation and DCCV, COPD, HTN, HLD, OSA not on CPAP, anxiety and depression admitted for atrial fibrillation with RVR.  Has been on Tikosyn and Cardizem drip per cardiology.  However, he was noted to have gradually progressive prolonged QTc.  Tikosyn was put on hold.  Initially, there was plan for TEE and DCCV early next week.  However, given improvement in his heart rate he was eventually transitioned to p.o. Cardizem CD.  Tikosyn resumed as well.  QTc remained stable at about 470.    Patient becomes diaphoretic, lightheaded and tachycardic with minimal exertion most likely from debility and deconditioning.  Work-up including echo, TSH and a.m. cortisol unrevealing.  At this point, the recommendation is to continue recommend OOB/PT/OT as tolerated at SNF.  See individual problem list below for more hospital course.  Discharge Diagnoses:  A. fib with RVR: PPM in 07/2019. HR 90-100 at rest.  He becomes diaphoretic and lightheaded with significant RVR with minimal exertion.  TSH within normal.  Echo with EF of 60 to 65% and mild LVH.  -Continue p.o. Tikosyn, Cardizem CD and  Eliquis as below. -Recheck BMP and magnesium in 1 week.  Orthostatic hypotension: Significant symptomatic orthostasis while trying to work with PT. equal, TSH and a.m. cortisol unrevealing.  Suspect this to be due to significant deconditioning -Continue TED hose, OOB for meals, PT/OT as tolerated.   Prolonged QTc: was 485 on EKG but adjusts to 470s for tachycardia -Recheck BMP and magnesium in 1 week -EP signed off.  Will arrange outpatient follow-up.  Controlled DM-2 with hyperglycemia and hyperlipidemia: A1c 6.9%. -Continue Lantus 18 units daily, SSI, Januvia and Metformin. -Continue statin.  History of CAD: No anginal symptoms. -Continue home medications  Essential hypertension: Normotensive -Continue Cardizem and losartan as above  Chronic COPD -Continue Trelegy Ellipta with as needed nebulizers  OSA: -Continue nightly CPAP  History of PTSD: Stable. -Continue Cymbalta  Debility/deconditioning: Diaphoretic and lightheaded with significant RVR with minimal exertion limiting his participation in therapy.  TSH, ammonia and a.m. cortisol within normal. -Continue TED hose/OOB/PT/OT as tolerated.  Severe malnutrition Nutrition Problem: Severe Malnutrition Etiology: acute illness(recent Werneke's encephalopathy)  Signs/Symptoms: moderate muscle depletion, moderate fat depletion, percent weight loss(16% weight loss within 3 months) Percent weight loss: 16 %  Interventions: Ensure Enlive (each supplement provides 350kcal and 20 grams of protein), MVI   Pressure Injury 02/20/20 Sacrum Medial Stage 2 -  Partial thickness loss of dermis presenting as a shallow open injury with a red, pink wound bed without slough. (Active)  02/20/20 2249  Location: Sacrum  Location Orientation: Medial  Staging: Stage 2 -  Partial thickness loss of dermis presenting as a shallow open injury with a red, pink wound bed  without slough.  Wound Description (Comments):   Present on Admission:  Yes    Discharge Exam: Vitals:   03/03/20 0418 03/03/20 0830  BP: 128/79   Pulse: 96   Resp: 19   Temp: 98.3 F (36.8 C)   SpO2: 96% 99%    GENERAL: No apparent distress.  Nontoxic. HEENT: MMM.  Vision and hearing grossly intact.  NECK: Supple.  No apparent JVD.  RESP:  No IWOB.  Fair aeration bilaterally. CVS: HR 90-100.  Heart sounds normal.  ABD/GI/GU: Bowel sounds present. Soft. Non tender.  MSK/EXT:  Moves extremities. No apparent deformity. No edema.  SKIN: no apparent skin lesion or wound NEURO: Awake, alert and oriented appropriately.  No apparent focal neuro deficit. PSYCH: Calm. Normal affect.  Discharge Instructions  Discharge Instructions    Diet - low sodium heart healthy   Complete by: As directed    Diet Carb Modified   Complete by: As directed    Increase activity slowly   Complete by: As directed      Allergies as of 03/03/2020      Reactions   Xarelto [rivaroxaban] Other (See Comments)   Patient stated he "ended up in the hospital with a rectal bleed"   Amoxicillin Rash, Other (See Comments)   * SEVERE RASH IN GROIN AREA Has patient had a PCN reaction causing immediate rash, facial/tongue/throat swelling, SOB or lightheadedness with hypotension: no Has patient had a PCN reaction causing severe rash involving mucus membranes or skin necrosis: no Has patient had a PCN reaction that required hospitalization: no Has patient had a PCN reaction occurring within the last 10 years: yes If all of the above answers are "NO", then may proceed with Cephalosporin use.   Augmentin [amoxicillin-pot Clavulanate] Rash, Other (See Comments)   * SEVERE RASH IN GROIN AREA   Azithromycin Rash, Other (See Comments)   * SEVERE RASH IN GROIN AREA   Clindamycin/lincomycin Rash   Keflex [cephalexin] Rash      Medication List    STOP taking these medications   fenofibrate micronized 200 MG capsule Commonly known as: LOFIBRA   metoprolol tartrate 50 MG  tablet Commonly known as: LOPRESSOR   Toujeo SoloStar 300 UNIT/ML Solostar Pen Generic drug: insulin glargine (1 Unit Dial) Replaced by: Lantus SoloStar 100 UNIT/ML Solostar Pen     TAKE these medications   allopurinol 300 MG tablet Commonly known as: ZYLOPRIM Take 1 tablet (300 mg total) by mouth daily.   apixaban 5 MG Tabs tablet Commonly known as: ELIQUIS Take 1 tablet (5 mg total) by mouth 2 (two) times daily.   aspirin 81 MG chewable tablet Chew 1 tablet (81 mg total) by mouth daily.   diltiazem 240 MG 24 hr capsule Commonly known as: Cartia XT Take 1 capsule (240 mg total) by mouth daily.   dofetilide 125 MCG capsule Commonly known as: TIKOSYN Take 1 capsule (125 mcg total) by mouth 2 (two) times daily. May have 90 day refills if patient prefers What changed: additional instructions   DULoxetine 60 MG capsule Commonly known as: Cymbalta Take 1 capsule (60 mg total) by mouth daily.   feeding supplement (ENSURE ENLIVE) Liqd Take 237 mLs by mouth daily.   fluticasone 50 MCG/ACT nasal spray Commonly known as: FLONASE Place 1 spray into both nostrils daily as needed for allergies or rhinitis.   gabapentin 300 MG capsule Commonly known as: Neurontin Take 1 capsule (300 mg total) by mouth at bedtime. Take at night as needed for  restless leg symptoms What changed:   when to take this  reasons to take this  additional instructions   glucose blood test strip Commonly known as: OneTouch Verio Test once daily.  Dx E11.9   hydrocortisone 2.5 % rectal cream Commonly known as: ANUSOL-HC Use nightly at bedtime for 10 days What changed:   how much to take  how to take this  when to take this  reasons to take this  additional instructions   insulin aspart 100 UNIT/ML injection Commonly known as: novoLOG Inject 0-14 Units into the skin in the morning and at bedtime. Sliding Scale: If blood sugar 0-200 = 0 units 201-250 = 2 units 251-300 = 4 units 301-350  = 6 units 351-400 = 8 units 401-450 = 10 units 451-500 = 12 units >500 = 14 units, recheck in 2 hours, then if >400 or <80 call MD. If blood sugar greater than 500, call MD.   Januvia 100 MG tablet Generic drug: sitaGLIPtin TAKE 1 TABLET(100 MG) BY MOUTH DAILY What changed: See the new instructions.   Lantus SoloStar 100 UNIT/ML Solostar Pen Generic drug: insulin glargine Inject 18 Units into the skin daily. Replaces: Toujeo SoloStar 300 UNIT/ML Solostar Pen   losartan 100 MG tablet Commonly known as: COZAAR Take 1 tablet (100 mg total) by mouth daily.   magnesium 30 MG tablet Take 1 tablet (30 mg total) by mouth every morning.   metFORMIN 1000 MG tablet Commonly known as: GLUCOPHAGE TAKE 1 TABLET BY MOUTH TWICE DAILY WITH A MEAL What changed: Another medication with the same name was removed. Continue taking this medication, and follow the directions you see here.   montelukast 10 MG tablet Commonly known as: SINGULAIR Take 10 mg by mouth at bedtime. What changed: Another medication with the same name was removed. Continue taking this medication, and follow the directions you see here.   multivitamin with minerals Tabs tablet Take 1 tablet by mouth daily. Start taking on: March 04, 2020   potassium chloride SA 20 MEQ tablet Commonly known as: KLOR-CON Take 1 tablet (20 mEq total) by mouth daily. Start taking on: March 04, 2020 What changed: how much to take   RABEprazole 20 MG tablet Commonly known as: ACIPHEX TAKE 1 TABLET(20 MG) BY MOUTH DAILY What changed: See the new instructions.   rosuvastatin 40 MG tablet Commonly known as: CRESTOR Take 1 tablet (40 mg total) by mouth daily.   thiamine 100 MG tablet Take 100 mg by mouth daily.   Trelegy Ellipta 100-62.5-25 MCG/INH Aepb Generic drug: Fluticasone-Umeclidin-Vilant Inhale 1 puff into the lungs daily.   Vascepa 1 g capsule Generic drug: icosapent Ethyl TAKE 2 CAPSULES(2 GRAMS) BY MOUTH TWICE DAILY What  changed: See the new instructions.       Consultations:  Cardiology/electrophysiology  Procedures/Studies:  2D Echo on 02/11/2020 1. Technically difficult study with limited views and patient  uncooperative. Grossly normal LV systolic function. Left ventricular  ejection fraction, by estimation, is 60 to 65%. Image quality is not  sufficient to assess for regional wall motiuon  abnormalities. There is mild left ventricular hypertrophy. Left  ventricular diastolic parameters are indeterminate.  2. Right ventricle is pooly visualized but grossly normal size with  mildly reduced systolic function.  3. The mitral valve is normal in structure. Mild mitral valve  regurgitation.  4. The aortic valve was not well visualized. Aortic valve regurgitation  is not visualized. No aortic stenosis is present.    CT HEAD WO CONTRAST  Result Date: 02/08/2020 CLINICAL DATA:  Post-traumatic headache. EXAM: CT HEAD WITHOUT CONTRAST TECHNIQUE: Contiguous axial images were obtained from the base of the skull through the vertex without intravenous contrast. COMPARISON:  None. FINDINGS: Brain: No evidence of acute infarction, hemorrhage, hydrocephalus, extra-axial collection or mass lesion/mass effect. Advanced atrophy and chronic microvascular ischemic changes are noted. Vascular: No hyperdense vessel or unexpected calcification. Skull: Normal. Negative for fracture or focal lesion. Sinuses/Orbits: Mucosal thickening is noted of the left maxillary sinus. The remaining paranasal sinuses and mastoid air cells are essentially clear. Other: None. IMPRESSION: 1. No acute intracranial abnormality. 2. Advanced atrophy and chronic microvascular ischemic changes. Electronically Signed   By: Constance Holster M.D.   On: 02/08/2020 20:56   MR BRAIN WO CONTRAST  Result Date: 02/10/2020 CLINICAL DATA:  Initial evaluation for acute encephalopathy. EXAM: MRI HEAD WITHOUT CONTRAST TECHNIQUE: Multiplanar, multiecho pulse  sequences of the brain and surrounding structures were obtained without intravenous contrast. COMPARISON:  Comparison made with prior head CT from 02/08/2020. FINDINGS: Brain: Diffuse prominence of the CSF containing spaces compatible with moderately advanced cerebral atrophy. Mild scattered T2/FLAIR hyperintensity within the periventricular white matter most consistent with chronic small vessel ischemic disease, minimal for age. No abnormal foci of restricted diffusion to suggest acute or subacute ischemia. Gray-white matter differentiation maintained. No encephalomalacia to suggest chronic cortical infarction. No foci of susceptibility artifact to suggest acute or chronic intracranial hemorrhage. No mass lesion, midline shift or mass effect. Diffuse ventriculomegaly, which could be related to global parenchymal volume loss. A degree of NPH could be contributory. Periventricular FLAIR hyperintensity could in part reflect transependymal flow of CSF in that setting. No extra-axial fluid collection. Pituitary gland within normal limits. Midline structures intact. Vascular: Major intracranial vascular flow voids are maintained. Skull and upper cervical spine: Craniocervical junction within normal limits. Upper cervical spine normal. Bone marrow signal intensity within normal limits. No scalp soft tissue abnormality. Sinuses/Orbits: Globes and orbital soft tissues within normal limits. Mucosal thickening with air-fluid level present within the left maxillary sinus, consistent with acute sinusitis. Scattered mucosal thickening noted within the left ethmoidal air cells as well. No mastoid effusion. Inner ear structures grossly normal. Other: None. IMPRESSION: 1. No acute intracranial abnormality. 2. Moderately advanced cerebral and cerebellar atrophy. 3. Diffuse ventriculomegaly. While this finding is undoubtedly at least in part related to underlying global parenchymal atrophy, a degree of NPH could also be contributory  in the correct clinical setting. 4. Acute left maxillary sinusitis. Electronically Signed   By: Jeannine Boga M.D.   On: 02/10/2020 01:03   DG Chest Port 1 View  Result Date: 02/20/2020 CLINICAL DATA:  Tachycardia EXAM: PORTABLE CHEST 1 VIEW COMPARISON:  02/08/2020 FINDINGS: Single frontal view of the chest demonstrates stable cardiac silhouette. Postsurgical changes from median sternotomy. Loop recorder again seen left anterior chest. No airspace disease, effusion, or pneumothorax. No acute bony abnormalities. IMPRESSION: 1. No acute intrathoracic process. Electronically Signed   By: Randa Ngo M.D.   On: 02/20/2020 17:08   DG Chest Port 1 View  Result Date: 02/08/2020 CLINICAL DATA:  Pt brought to ED by family after fall in the bathroom. Pt reports he fell around 6 or 7 or 12 today. Pt states is has been on the floor the whole time but then states his wife and neighbor got him up after the fall. Dried blood noted to R eyebrow, bruising under R eye. Pt denies blood thinners. Pt is oriented to self, place, pt states he slipped  on urine in the bathroom and hit head on commode EXAM: PORTABLE CHEST 1 VIEW COMPARISON:  12/03/2019 FINDINGS: Stable changes from prior cardiac surgery. Cardiac silhouette is normal in size. No mediastinal or hilar masses. Stable left anterior chest wall loop recorder. Clear lungs.  No pleural effusion or pneumothorax. Skeletal structures are grossly intact. IMPRESSION: No acute cardiopulmonary disease. Electronically Signed   By: Lajean Manes M.D.   On: 02/08/2020 20:58   DG Swallowing Func-Speech Pathology  Result Date: 02/14/2020 Objective Swallowing Evaluation: Type of Study: MBS-Modified Barium Swallow Study  Patient Details Name: ALMEDIN GERLT MRN: [id number] Date of Birth: November 03, 1950 Today's Date: 02/14/2020 Time: SLP Start Time (ACUTE ONLY): 0855 -SLP Stop Time (ACUTE ONLY): 0910 SLP Time Calculation (min) (ACUTE ONLY): 15 min Past Medical History: Past Medical  History: Diagnosis Date . Allergy  . Anxiety   history of PTSD following CABG . Ascending aortic aneurysm ([place]) 01/31/2018  43 x 42 mm, pt unaware . Asthma  . Cardiomegaly 10/17/2017 . Colitis- colonoscopy 2014 07/13/2015 . COPD (chronic obstructive pulmonary disease) ([place])  . Coronary artery disease   x 6 . Depression  . Diabetes mellitus without complication ([place])  . Family history of polyps in the colon  . Finger dislocation   Left pinkie . GERD (gastroesophageal reflux disease)  . Gout  . H/O atrial fibrillation without current medication   following CABG with no documented episodes since then. . Heart palpitations  . Hx of adenomatous colonic polyps 08/12/2010 . Hyperlipidemia  . Hypertension  . OA (osteoarthritis)  . OSA (obstructive sleep apnea)   Mild, has not received CPAP yet . Prediabetes  . RLS (restless legs syndrome)  . 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 ANKLE FRACTURE SURGERY Right 1991 . APPENDECTOMY   . ATRIAL FIBRILLATION ABLATION N/A 03/31/2018  Procedure: ATRIAL FIBRILLATION ABLATION;  Surgeon: [name], MD;  Location: [facility];  Service: Cardiovascular;  Laterality: N/A; . CARDIAC ELECTROPHYSIOLOGY MAPPING AND ABLATION   . COLONOSCOPY W/ BIOPSIES  2017  x7 . CORONARY ANGIOPLASTY WITH STENT PLACEMENT   . CORONARY ARTERY BYPASS GRAFT   . FINGER SURGERY  04/2018  Small finger left hand . implantable loop recorder placement  07/02/2019  Medtronic Reveal [place] model [id number] ([id number] [id number]) implanted in office by Dr Rayann Heman . TEE WITHOUT CARDIOVERSION N/A 03/30/2018  Procedure: TRANSESOPHAGEAL ECHOCARDIOGRAM (TEE);  Surgeon: [name], MD;  Location: [facility];  Service: Cardiovascular;  Laterality: N/A; . TOTAL HIP ARTHROPLASTY Left  . TOTAL HIP ARTHROPLASTY Right 09/12/2018  Procedure: TOTAL HIP ARTHROPLASTY ANTERIOR APPROACH;  Surgeon: [name], MD;  Location: [facility];  Service: Orthopedics;   Laterality: Right;  70 mins HPI: MOHANNAD BERRIGAN is a 69 y.o. male with history of diabetes mellitus type 2, CAD status post CABG, A. fib not on anticoagulation probably from risk of falls with history of hypertension alcohol abuse presents to the ER the patient had a fall at home.  MRI was negative for acute intracranial abnormality.  Subjective: Pt was alert and cooperative Assessment / Plan / Recommendation CHL IP CLINICAL IMPRESSIONS 02/14/2020 Clinical Impression Pt presents with mild pharyngeal dysphagia characterized by a pharyngeal delay which resulted in penetration (PAS 3) of thin liquids via straw when consecutive swallows of used. Aspiration was not observed during the study but aspiration of penetrated material is likely. A regular texture diet with thin liquids is recommended at this time with observance of  swallowing precautions. SLP will follow for dysphagia treatment.  SLP Visit Diagnosis Dysphagia, unspecified (R13.10) Attention and concentration deficit following -- Frontal lobe and executive function deficit following -- Impact on safety and function --   CHL IP TREATMENT RECOMMENDATION 02/14/2020 Treatment Recommendations Therapy as outlined in treatment plan below   Prognosis 02/14/2020 Prognosis for Safe Diet Advancement Good Barriers to Reach Goals -- Barriers/Prognosis Comment -- CHL IP DIET RECOMMENDATION 02/14/2020 SLP Diet Recommendations Thin liquid;Regular solids Liquid Administration via -- Medication Administration Whole meds with puree Compensations Slow rate;Small sips/bites Postural Changes --   CHL IP OTHER RECOMMENDATIONS 02/14/2020 Recommended Consults -- Oral Care Recommendations Oral care BID Other Recommendations --   CHL IP FOLLOW UP RECOMMENDATIONS 02/14/2020 Follow up Recommendations None   CHL IP FREQUENCY AND DURATION 02/14/2020 Speech Therapy Frequency (ACUTE ONLY) min 2x/week Treatment Duration 2 weeks      CHL IP ORAL PHASE 02/14/2020 Oral Phase WFL Oral - Pudding Teaspoon  -- Oral - Pudding Cup -- Oral - Honey Teaspoon -- Oral - Honey Cup -- Oral - Nectar Teaspoon -- Oral - Nectar Cup -- Oral - Nectar Straw -- Oral - Thin Teaspoon -- Oral - Thin Cup -- Oral - Thin Straw -- Oral - Puree -- Oral - Mech Soft -- Oral - Regular -- Oral - Multi-Consistency -- Oral - Pill -- Oral Phase - Comment --  CHL IP PHARYNGEAL PHASE 02/14/2020 Pharyngeal Phase -- Pharyngeal- Pudding Teaspoon -- Pharyngeal -- Pharyngeal- Pudding Cup -- Pharyngeal -- Pharyngeal- Honey Teaspoon -- Pharyngeal -- Pharyngeal- Honey Cup -- Pharyngeal -- Pharyngeal- Nectar Teaspoon -- Pharyngeal -- Pharyngeal- Nectar Cup -- Pharyngeal -- Pharyngeal- Nectar Straw -- Pharyngeal -- Pharyngeal- Thin Teaspoon -- Pharyngeal -- Pharyngeal- Thin Cup -- Pharyngeal -- Pharyngeal- Thin Straw -- Pharyngeal Material enters airway, remains ABOVE vocal cords and not ejected out Pharyngeal- Puree -- Pharyngeal -- Pharyngeal- Mechanical Soft -- Pharyngeal -- Pharyngeal- Regular -- Pharyngeal -- Pharyngeal- Multi-consistency -- Pharyngeal -- Pharyngeal- Pill -- Pharyngeal -- Pharyngeal Comment --  CHL IP CERVICAL ESOPHAGEAL PHASE 02/14/2020 Cervical Esophageal Phase WFL Pudding Teaspoon -- Pudding Cup -- Honey Teaspoon -- Honey Cup -- Nectar Teaspoon -- Nectar Cup -- Nectar Straw -- Thin Teaspoon -- Thin Cup -- Thin Straw -- Puree -- Mechanical Soft -- Regular -- Multi-consistency -- Pill -- Cervical Esophageal Comment -- Shanika I. Hardin Negus, [place], [place] Office number [phone] Pager [phone] Horton Marshall 02/14/2020, 2:02 PM              ECHOCARDIOGRAM COMPLETE  Result Date: 02/11/2020    ECHOCARDIOGRAM REPORT   Patient Name:   JAMAICA AGUAYO Date of Exam: 02/11/2020 Medical Rec #:  [id number]        Height:       70.0 in Accession #:    [id number]       Weight:       242.0 lb Date of Birth:  06/17/1951        BSA:          2.263 m Patient Age:    84 years         BP:           121/96 mmHg Patient  Gender: M                HR:           68 bpm. Exam Location:  Inpatient Procedure: 2D Echo Indications:    Abnormal ECG R94.31  History:  Patient has prior history of Echocardiogram examinations, most                 recent 03/30/2018. Prior CABG, COPD, Arrythmias:Atrial                 Fibrillation; Risk Factors:Hypertension, Dyslipidemia and                 Diabetes.  Sonographer:    Mikki Santee RDCS (AE) Referring Phys: [place]  Sonographer Comments: No subcostal window. Image acquisition challenging due to uncooperative patient. IMPRESSIONS  1. Technically difficult study with limited views and patient uncooperative. Grossly normal LV systolic function. Left ventricular ejection fraction, by estimation, is 60 to 65%. Image quality is not sufficient to assess for regional wall motiuon abnormalities. There is mild left ventricular hypertrophy. Left ventricular diastolic parameters are indeterminate.  2. Right ventricle is pooly visualized but grossly normal size with mildly reduced systolic function.  3. The mitral valve is normal in structure. Mild mitral valve regurgitation.  4. The aortic valve was not well visualized. Aortic valve regurgitation is not visualized. No aortic stenosis is present. FINDINGS  Left Ventricle: Left ventricular ejection fraction, by estimation, is 60 to 65%. The left ventricle has normal function. The left ventricle has no regional wall motion abnormalities. The left ventricular internal cavity size was normal in size. There is  mild left ventricular hypertrophy. Left ventricular diastolic parameters are indeterminate. Right Ventricle: The right ventricular size is normal. Right vetricular wall thickness was not assessed. Right ventricular systolic function is mildly reduced. Left Atrium: Left atrial size was normal in size. Right Atrium: Right atrial size was normal in size. Pericardium: There is no evidence of pericardial effusion. Mitral Valve: The  mitral valve is normal in structure. Mild mitral valve regurgitation. Tricuspid Valve: The tricuspid valve is grossly normal. Tricuspid valve regurgitation is not demonstrated. Aortic Valve: The aortic valve was not well visualized. Aortic valve regurgitation is not visualized. No aortic stenosis is present. Pulmonic Valve: The pulmonic valve was not well visualized. Pulmonic valve regurgitation is not visualized. Aorta: The aortic root is normal in size and structure. IAS/Shunts: The interatrial septum was not well visualized.  LEFT VENTRICLE PLAX 2D LVIDd:         4.70 cm  Diastology LVIDs:         3.40 cm  LV e' lateral: 7.35 cm/s LV PW:         1.10 cm  LV e' medial:  5.10 cm/s LV IVS:        1.00 cm LVOT diam:     2.20 cm LV SV:         65 LV SV Index:   29 LVOT Area:     3.80 cm  RIGHT VENTRICLE RV S prime:     6.30 cm/s TAPSE (M-mode): 1.9 cm LEFT ATRIUM             Index       RIGHT ATRIUM           Index LA diam:        4.20 cm 1.86 cm/m  RA Area:     12.90 cm LA Vol (A2C):   47.5 ml 20.99 ml/m RA Volume:   24.40 ml  10.78 ml/m LA Vol (A4C):   33.5 ml 14.80 ml/m LA Biplane Vol: 43.1 ml 19.04 ml/m  AORTIC VALVE LVOT Vmax:   94.00 cm/s LVOT Vmean:  64.500 cm/s LVOT VTI:  0.172 m  AORTA Ao Root diam: 3.50 cm MR Peak grad: 59.3 mmHg MR Vmax:      385.00 cm/s SHUNTS                           Systemic VTI:  0.17 m                           Systemic Diam: 2.20 cm Oswaldo Milian MD Electronically signed by Oswaldo Milian MD Signature Date/Time: 02/11/2020/4:51:40 PM    Final    CUP PACEART REMOTE DEVICE CHECK  Result Date: 02/10/2020 Carelink summary report received. Battery status OK. Normal device function. No new symptom episodes, 2 tachy episodes previously reviewed/reported as AF w/ RVR w/ max VR 240bpm. No brady, or pause episodes. AF burden 5.1% w/ longest last 90 days 2 hours. Known PAF on tikosyn and lopressor, no OAC noted. Was seen in ED 02/08/20. Monthly summary reports and ROV/PRN  JMoose      The results of significant diagnostics from this hospitalization (including imaging, microbiology, ancillary and laboratory) are listed below for reference.     Microbiology: Recent Results (from the past 240 hour(s))  SARS CORONAVIRUS 2 (TAT 6-24 HRS) Nasopharyngeal Nasopharyngeal Swab     Status: None   Collection Time: 02/27/20 11:33 AM   Specimen: Nasopharyngeal Swab  Result Value Ref Range Status   SARS Coronavirus 2 NEGATIVE NEGATIVE Final    Comment: (NOTE) SARS-CoV-2 target nucleic acids are NOT DETECTED. The SARS-CoV-2 RNA is generally detectable in upper and lower respiratory specimens during the acute phase of infection. Negative results do not preclude SARS-CoV-2 infection, do not rule out co-infections with other pathogens, and should not be used as the sole basis for treatment or other patient management decisions. Negative results must be combined with clinical observations, patient history, and epidemiological information. The expected result is Negative. Fact Sheet for Patients: SugarRoll.be Fact Sheet for Healthcare Providers: https://www.woods-mathews.com/ This test is not yet approved or cleared by the [place] FDA and  has been authorized for detection and/or diagnosis of SARS-CoV-2 by FDA under an Emergency Use Authorization (EUA). This EUA will remain  in effect (meaning this test can be used) for the duration of the COVID-19 declaration under Section 56 4(b)(1) of the Act, 21 U.S.C. section 360bbb-3(b)(1), unless the authorization is terminated or revoked sooner. Performed at [facility], [place] [address] [place], [place] [address]      Labs: BNP (last 3 results) No results for input(s): BNP in the last 8760 hours. Basic Metabolic Panel: Recent Labs  Lab 02/28/20 0340 02/29/20 0309 03/01/20 0827 03/02/20 0418 03/03/20 0356  NA 135 136 136 137 135  K 4.1 4.7 4.2 4.7 3.9  CL 105  102 103 102 102  CO2 21* 24 23 27 25   GLUCOSE 140* 172* 196* 177* 203*  BUN 7* 9 11 9 13   CREATININE 0.77 0.86 0.72 0.86 0.96  CALCIUM 10.1 10.5* 10.5* 10.5* 10.2  MG 1.9 1.9 1.8 2.0 1.8  PHOS  --  4.3 3.4 3.9 4.0   Liver Function Tests: Recent Labs  Lab 02/29/20 0309 03/01/20 0827 03/02/20 0418 03/03/20 0356  ALBUMIN 3.5 3.5 3.6 3.3*   No results for input(s): LIPASE, AMYLASE in the last 168 hours. Recent Labs  Lab 03/01/20 1108  AMMONIA 28   CBC: Recent Labs  Lab 02/29/20 0309  WBC 7.4  HGB 13.9  HCT 41.9  MCV 94.4  PLT 266   Cardiac Enzymes: No results for input(s): CKTOTAL, CKMB, CKMBINDEX, TROPONINI in the last 168 hours. BNP: Invalid input(s): POCBNP CBG: Recent Labs  Lab 03/02/20 0756 03/02/20 1121 03/02/20 1629 03/02/20 2116 03/03/20 0834  GLUCAP 158* 184* 132* 180* 163*   D-Dimer No results for input(s): DDIMER in the last 72 hours. Hgb A1c Recent Labs    03/01/20 0847  HGBA1C 6.9*   Lipid Profile No results for input(s): CHOL, HDL, LDLCALC, TRIG, CHOLHDL, LDLDIRECT in the last 72 hours. Thyroid function studies No results for input(s): TSH, T4TOTAL, T3FREE, THYROIDAB in the last 72 hours.  Invalid input(s): FREET3 Anemia work up No results for input(s): VITAMINB12, FOLATE, FERRITIN, TIBC, IRON, RETICCTPCT in the last 72 hours. Urinalysis    Component Value Date/Time   COLORURINE AMBER (A) 02/20/2020 1957   APPEARANCEUR HAZY (A) 02/20/2020 1957   LABSPEC 1.020 02/20/2020 1957   PHURINE 5.0 02/20/2020 1957   GLUCOSEU 50 (A) 02/20/2020 1957   HGBUR NEGATIVE 02/20/2020 1957   BILIRUBINUR NEGATIVE 02/20/2020 1957   BILIRUBINUR 1+ 08/14/2013 1200   KETONESUR NEGATIVE 02/20/2020 1957   PROTEINUR 30 (A) 02/20/2020 1957   UROBILINOGEN 0.2 08/14/2013 1200   NITRITE NEGATIVE 02/20/2020 1957   LEUKOCYTESUR TRACE (A) 02/20/2020 1957   Sepsis Labs Invalid input(s): PROCALCITONIN,  WBC,  LACTICIDVEN   Time coordinating discharge: 40  minutes  SIGNED:  Mercy Riding, MD  [facility] 03/03/2020, 11:01 AM  If 7PM-7AM, please contact night-coverage [email or web address] Password [email or web address]

## 2020-03-03 NOTE — Progress Notes (Signed)
Pharmacy: Dofetilide (Tikosyn) - Follow Up Assessment and Electrolyte Replacement  Pharmacy consulted to assist in monitoring and replacing electrolytes in this 69 y.o. male admitted on 02/20/2020 undergoing dofetilide re-initiation.   Labs:    Component Value Date/Time   K 3.9 03/03/2020 0356   MG 1.8 03/03/2020 0356     Plan: Potassium: K 3.8-3.9:  Give KCl 40 mEq po x1    Magnesium: Mg 1.8-2: Give Mg 2 gm IV x1   Recommend discharging patient with prescription for: -Potassium chloride 20 mEq  daily  Lorel Monaco, PharmD PGY1 [place] Resident [employer] # [phone]

## 2020-03-03 NOTE — Progress Notes (Signed)
Attempted to call for a report to [facility].  No answer.  Idolina Primer, RN

## 2020-03-03 NOTE — TOC Transition Note (Signed)
Transition of Care [facility]) - CM/SW Discharge Note   Patient Details  Name: Adam [name] MRN: [id number] Date of Birth: 1951-06-27  Transition of Care [facility]) CM/SW Contact:  Trula Ore, [place] Phone Number: 03/03/2020, 11:57 AM   Clinical Narrative:      Patient will DC to: [place] date: 03/03/2020  Family notified: [name]  Transport by: Corey Harold  ?  Per MD patient ready for DC to [facility] . RN, patient, patient's family, and facility notified of DC. Discharge Summary sent to facility. RN given number for report tele# [phone] RM#103P [employer]. DC packet on chart. Ambulance transport requested for patient.  CSW signing off.   Final next level of care: Skilled Nursing Facility Barriers to Discharge: No Barriers Identified   Patient Goals and CMS Choice Patient states their goals for this hospitalization and ongoing recovery are:: SNF CMS Medicare.gov Compare Post Acute Care list provided to:: Patient Represenative (must comment)([name] Patients spouse) Choice offered to / list presented to : Spouse  Discharge Placement              Patient chooses bed at: [facility] Patient to be transferred to facility by: [facility] Name of family member notified: [name] Patient and family notified of of transfer: 03/03/20  Discharge Plan and Services                                     Social Determinants of Health (SDOH) Interventions     Readmission Risk Interventions Readmission Risk Prevention Plan 02/27/2020  Transportation Screening Complete  PCP or Specialist Appt within 3-5 Days Complete  HRI or Home Care Consult Complete  Social Work Consult for [place] Planning/Counseling Complete  Palliative Care Screening Not Applicable  Medication Review Press photographer) Complete  Some recent data might be hidden

## 2020-03-04 ENCOUNTER — Telehealth: Payer: Self-pay | Admitting: Family Medicine

## 2020-03-04 ENCOUNTER — Ambulatory Visit (HOSPITAL_COMMUNITY): Payer: Medicare Other | Admitting: Physician Assistant

## 2020-03-04 DIAGNOSIS — I951 Orthostatic hypotension: Secondary | ICD-10-CM | POA: Diagnosis not present

## 2020-03-04 DIAGNOSIS — I480 Paroxysmal atrial fibrillation: Secondary | ICD-10-CM | POA: Diagnosis not present

## 2020-03-04 DIAGNOSIS — R9431 Abnormal electrocardiogram [ECG] [EKG]: Secondary | ICD-10-CM | POA: Diagnosis not present

## 2020-03-04 DIAGNOSIS — R29898 Other symptoms and signs involving the musculoskeletal system: Secondary | ICD-10-CM | POA: Diagnosis not present

## 2020-03-04 NOTE — Progress Notes (Signed)
  Chronic Care Management   Outreach Note  03/04/2020 Name: Adam [name] MRN: [id number] DOB: [birth year]  Referred by: [name], MD Reason for referral : No chief complaint on file.   Third unsuccessful telephone outreach was attempted today. The patient was referred to the pharmacist for assistance with care management and care coordination.   Follow Up Plan:   Prathima Ghanta Upstream Scheduler

## 2020-03-07 ENCOUNTER — Ambulatory Visit: Payer: Medicare Other | Admitting: Cardiology

## 2020-03-11 DIAGNOSIS — E119 Type 2 diabetes mellitus without complications: Secondary | ICD-10-CM | POA: Diagnosis not present

## 2020-03-11 DIAGNOSIS — I480 Paroxysmal atrial fibrillation: Secondary | ICD-10-CM | POA: Diagnosis not present

## 2020-03-11 DIAGNOSIS — R9431 Abnormal electrocardiogram [ECG] [EKG]: Secondary | ICD-10-CM | POA: Diagnosis not present

## 2020-03-11 DIAGNOSIS — I119 Hypertensive heart disease without heart failure: Secondary | ICD-10-CM | POA: Diagnosis not present

## 2020-03-12 ENCOUNTER — Telehealth: Payer: Self-pay

## 2020-03-12 NOTE — Telephone Encounter (Signed)
Left message for patient to inform of disconnected monitor. 

## 2020-03-13 ENCOUNTER — Telehealth: Payer: Self-pay

## 2020-03-13 DIAGNOSIS — F1027 Alcohol dependence with alcohol-induced persisting dementia: Secondary | ICD-10-CM | POA: Diagnosis not present

## 2020-03-13 DIAGNOSIS — E878 Other disorders of electrolyte and fluid balance, not elsewhere classified: Secondary | ICD-10-CM | POA: Diagnosis not present

## 2020-03-13 DIAGNOSIS — I951 Orthostatic hypotension: Secondary | ICD-10-CM | POA: Diagnosis not present

## 2020-03-13 DIAGNOSIS — I10 Essential (primary) hypertension: Secondary | ICD-10-CM | POA: Diagnosis not present

## 2020-03-13 NOTE — Telephone Encounter (Signed)
The pt wife states he is now in a nursing home and do not know what to do with his monitor. I let her know his monitor needs to be with him. He has to be monitored. She states she will take the monitor tomorrow.

## 2020-03-16 DIAGNOSIS — R2689 Other abnormalities of gait and mobility: Secondary | ICD-10-CM | POA: Diagnosis not present

## 2020-03-16 DIAGNOSIS — E512 Wernicke's encephalopathy: Secondary | ICD-10-CM | POA: Diagnosis not present

## 2020-03-16 DIAGNOSIS — M6281 Muscle weakness (generalized): Secondary | ICD-10-CM | POA: Diagnosis not present

## 2020-03-16 DIAGNOSIS — R41841 Cognitive communication deficit: Secondary | ICD-10-CM | POA: Diagnosis not present

## 2020-03-16 DIAGNOSIS — R2681 Unsteadiness on feet: Secondary | ICD-10-CM | POA: Diagnosis not present

## 2020-03-16 DIAGNOSIS — R1312 Dysphagia, oropharyngeal phase: Secondary | ICD-10-CM | POA: Diagnosis not present

## 2020-03-16 DIAGNOSIS — Z9181 History of falling: Secondary | ICD-10-CM | POA: Diagnosis not present

## 2020-03-16 DIAGNOSIS — M6258 Muscle wasting and atrophy, not elsewhere classified, other site: Secondary | ICD-10-CM | POA: Diagnosis not present

## 2020-03-17 ENCOUNTER — Ambulatory Visit (INDEPENDENT_AMBULATORY_CARE_PROVIDER_SITE_OTHER): Payer: Medicare Other | Admitting: *Deleted

## 2020-03-17 ENCOUNTER — Encounter (HOSPITAL_COMMUNITY): Payer: Medicare Other | Admitting: Physician Assistant

## 2020-03-17 ENCOUNTER — Telehealth: Payer: Self-pay

## 2020-03-17 ENCOUNTER — Telehealth: Payer: Self-pay | Admitting: Cardiology

## 2020-03-17 DIAGNOSIS — F4310 Post-traumatic stress disorder, unspecified: Secondary | ICD-10-CM | POA: Diagnosis not present

## 2020-03-17 DIAGNOSIS — F1011 Alcohol abuse, in remission: Secondary | ICD-10-CM | POA: Diagnosis not present

## 2020-03-17 DIAGNOSIS — I4891 Unspecified atrial fibrillation: Secondary | ICD-10-CM | POA: Diagnosis not present

## 2020-03-17 DIAGNOSIS — F329 Major depressive disorder, single episode, unspecified: Secondary | ICD-10-CM | POA: Diagnosis not present

## 2020-03-17 DIAGNOSIS — Z8669 Personal history of other diseases of the nervous system and sense organs: Secondary | ICD-10-CM | POA: Diagnosis not present

## 2020-03-17 LAB — CUP PACEART REMOTE DEVICE CHECK
Date Time Interrogation Session: 20210614001718
Implantable Pulse Generator Implant Date: 20200928

## 2020-03-17 NOTE — Progress Notes (Signed)
[facility] Summary Report / Loop Recorder 

## 2020-03-17 NOTE — Telephone Encounter (Signed)
Carelink alert received 03/17/20 for increased AT/AF buren from 5.1% to 59.5%. Per med list taking Eliquis 5 mg, diltizem 240mg  (24hr capsule), tikosyn 125 mcg.   Patient had f/u at AF clinic today, in [place] apt. noted canceled. Spoke to wife (Diane- DPR) states she canceled apt. d/t patient in facility. States she is unsure if patient will come in for apt. In the future depending on if he stays there long term.   Per d/c note from hospital, patient is currently residing in [facility] in [place], [place] [phone]. Attempted to call to assess patient or speak to patient. Per nurse, patient has been resting most of the day and to her knowledge has had no complaints. Spoke to wife, states patient has not reported of any complaints, he has been resting most of the day. States patient may be there short term for therapy or long term, she is not sure at this present moment. States patient is moving to another room tomorrow and she will bring remote box to update transmissions. Informed her that an increased amount of AF is noted on his device. Advised her to call DC if patient has any symptoms or complaints.   Forwarding to Dr. Nathen May clinic for recommendations.

## 2020-03-17 NOTE — Telephone Encounter (Signed)
Pt was admitted to hospital from 5/19-5/31 where he was reloaded on dofetilide during admission. Will need updated transmission to see AF burden since back on dofetilide. Wife had refused to reschedule AF appt since pt in facility when wife called to cancel appt for 6/14.

## 2020-03-17 NOTE — Telephone Encounter (Signed)
I attempted to contact patient on 03/17/20 to schedule follow up visit from patients recall list with Dr.[name]. The patient didn't answer so I left message for patient to return call to get that appointment scheduled.

## 2020-03-18 DIAGNOSIS — E119 Type 2 diabetes mellitus without complications: Secondary | ICD-10-CM | POA: Diagnosis not present

## 2020-03-18 DIAGNOSIS — I951 Orthostatic hypotension: Secondary | ICD-10-CM | POA: Diagnosis not present

## 2020-03-18 DIAGNOSIS — E871 Hypo-osmolality and hyponatremia: Secondary | ICD-10-CM | POA: Diagnosis not present

## 2020-03-18 DIAGNOSIS — R42 Dizziness and giddiness: Secondary | ICD-10-CM | POA: Diagnosis not present

## 2020-03-18 NOTE — Telephone Encounter (Signed)
LMOVM for pt wife to return my call.

## 2020-03-18 NOTE — Telephone Encounter (Signed)
The pt wife states he finally received his new room in the facility and she is taking it tomorrow to get it set up.

## 2020-03-19 NOTE — Telephone Encounter (Signed)
Transmission received.

## 2020-03-24 DIAGNOSIS — Z9181 History of falling: Secondary | ICD-10-CM | POA: Diagnosis not present

## 2020-03-24 DIAGNOSIS — M6281 Muscle weakness (generalized): Secondary | ICD-10-CM | POA: Diagnosis not present

## 2020-03-24 DIAGNOSIS — M6258 Muscle wasting and atrophy, not elsewhere classified, other site: Secondary | ICD-10-CM | POA: Diagnosis not present

## 2020-03-24 DIAGNOSIS — E512 Wernicke's encephalopathy: Secondary | ICD-10-CM | POA: Diagnosis not present

## 2020-03-24 DIAGNOSIS — R1312 Dysphagia, oropharyngeal phase: Secondary | ICD-10-CM | POA: Diagnosis not present

## 2020-03-24 DIAGNOSIS — R41841 Cognitive communication deficit: Secondary | ICD-10-CM | POA: Diagnosis not present

## 2020-03-24 DIAGNOSIS — R2689 Other abnormalities of gait and mobility: Secondary | ICD-10-CM | POA: Diagnosis not present

## 2020-03-24 DIAGNOSIS — R2681 Unsteadiness on feet: Secondary | ICD-10-CM | POA: Diagnosis not present

## 2020-03-24 NOTE — Telephone Encounter (Signed)
Patient and wife cancelled appt with AF clinic and refused follow up as he is currently in a SNF. Recommendation would be to increase diltiazem to 300 mg daily. Follow up with Dr Rayann Heman.

## 2020-03-25 DIAGNOSIS — F1027 Alcohol dependence with alcohol-induced persisting dementia: Secondary | ICD-10-CM | POA: Diagnosis not present

## 2020-03-25 DIAGNOSIS — R2681 Unsteadiness on feet: Secondary | ICD-10-CM | POA: Diagnosis not present

## 2020-03-25 DIAGNOSIS — M6258 Muscle wasting and atrophy, not elsewhere classified, other site: Secondary | ICD-10-CM | POA: Diagnosis not present

## 2020-03-25 DIAGNOSIS — Z9181 History of falling: Secondary | ICD-10-CM | POA: Diagnosis not present

## 2020-03-25 DIAGNOSIS — E512 Wernicke's encephalopathy: Secondary | ICD-10-CM | POA: Diagnosis not present

## 2020-03-25 DIAGNOSIS — J984 Other disorders of lung: Secondary | ICD-10-CM | POA: Diagnosis not present

## 2020-03-25 DIAGNOSIS — I480 Paroxysmal atrial fibrillation: Secondary | ICD-10-CM | POA: Diagnosis not present

## 2020-03-25 DIAGNOSIS — M6281 Muscle weakness (generalized): Secondary | ICD-10-CM | POA: Diagnosis not present

## 2020-03-25 DIAGNOSIS — R2689 Other abnormalities of gait and mobility: Secondary | ICD-10-CM | POA: Diagnosis not present

## 2020-03-25 DIAGNOSIS — R1312 Dysphagia, oropharyngeal phase: Secondary | ICD-10-CM | POA: Diagnosis not present

## 2020-03-25 DIAGNOSIS — E878 Other disorders of electrolyte and fluid balance, not elsewhere classified: Secondary | ICD-10-CM | POA: Diagnosis not present

## 2020-03-25 DIAGNOSIS — R41841 Cognitive communication deficit: Secondary | ICD-10-CM | POA: Diagnosis not present

## 2020-03-26 DIAGNOSIS — E512 Wernicke's encephalopathy: Secondary | ICD-10-CM | POA: Diagnosis not present

## 2020-03-26 DIAGNOSIS — M6258 Muscle wasting and atrophy, not elsewhere classified, other site: Secondary | ICD-10-CM | POA: Diagnosis not present

## 2020-03-26 DIAGNOSIS — Z9181 History of falling: Secondary | ICD-10-CM | POA: Diagnosis not present

## 2020-03-26 DIAGNOSIS — M6281 Muscle weakness (generalized): Secondary | ICD-10-CM | POA: Diagnosis not present

## 2020-03-26 DIAGNOSIS — R2681 Unsteadiness on feet: Secondary | ICD-10-CM | POA: Diagnosis not present

## 2020-03-26 DIAGNOSIS — R41841 Cognitive communication deficit: Secondary | ICD-10-CM | POA: Diagnosis not present

## 2020-03-26 DIAGNOSIS — R2689 Other abnormalities of gait and mobility: Secondary | ICD-10-CM | POA: Diagnosis not present

## 2020-03-26 DIAGNOSIS — R1312 Dysphagia, oropharyngeal phase: Secondary | ICD-10-CM | POA: Diagnosis not present

## 2020-03-28 DIAGNOSIS — R1312 Dysphagia, oropharyngeal phase: Secondary | ICD-10-CM | POA: Diagnosis not present

## 2020-03-28 DIAGNOSIS — E512 Wernicke's encephalopathy: Secondary | ICD-10-CM | POA: Diagnosis not present

## 2020-03-28 DIAGNOSIS — R2681 Unsteadiness on feet: Secondary | ICD-10-CM | POA: Diagnosis not present

## 2020-03-28 DIAGNOSIS — R2689 Other abnormalities of gait and mobility: Secondary | ICD-10-CM | POA: Diagnosis not present

## 2020-03-28 DIAGNOSIS — Z9181 History of falling: Secondary | ICD-10-CM | POA: Diagnosis not present

## 2020-03-28 DIAGNOSIS — R41841 Cognitive communication deficit: Secondary | ICD-10-CM | POA: Diagnosis not present

## 2020-03-28 DIAGNOSIS — M6281 Muscle weakness (generalized): Secondary | ICD-10-CM | POA: Diagnosis not present

## 2020-03-28 DIAGNOSIS — M6258 Muscle wasting and atrophy, not elsewhere classified, other site: Secondary | ICD-10-CM | POA: Diagnosis not present

## 2020-03-28 MED ORDER — DILTIAZEM HCL ER COATED BEADS 300 MG PO CP24
300.0000 mg | ORAL_CAPSULE | Freq: Every day | ORAL | 3 refills | Status: DC
Start: 2020-03-28 — End: 2020-09-09

## 2020-03-28 NOTE — Addendum Note (Signed)
Addended by: Juluis Mire on: 03/28/2020 10:53 AM   Modules accepted: Orders

## 2020-03-28 NOTE — Telephone Encounter (Signed)
[facility] notified of increase in dosing of cardizem to 300mg  daily.

## 2020-03-29 DIAGNOSIS — Z9181 History of falling: Secondary | ICD-10-CM | POA: Diagnosis not present

## 2020-03-29 DIAGNOSIS — R41841 Cognitive communication deficit: Secondary | ICD-10-CM | POA: Diagnosis not present

## 2020-03-29 DIAGNOSIS — M6281 Muscle weakness (generalized): Secondary | ICD-10-CM | POA: Diagnosis not present

## 2020-03-29 DIAGNOSIS — E512 Wernicke's encephalopathy: Secondary | ICD-10-CM | POA: Diagnosis not present

## 2020-03-29 DIAGNOSIS — R2689 Other abnormalities of gait and mobility: Secondary | ICD-10-CM | POA: Diagnosis not present

## 2020-03-29 DIAGNOSIS — M6258 Muscle wasting and atrophy, not elsewhere classified, other site: Secondary | ICD-10-CM | POA: Diagnosis not present

## 2020-03-29 DIAGNOSIS — R1312 Dysphagia, oropharyngeal phase: Secondary | ICD-10-CM | POA: Diagnosis not present

## 2020-03-29 DIAGNOSIS — R2681 Unsteadiness on feet: Secondary | ICD-10-CM | POA: Diagnosis not present

## 2020-03-30 DIAGNOSIS — E512 Wernicke's encephalopathy: Secondary | ICD-10-CM | POA: Diagnosis not present

## 2020-03-30 DIAGNOSIS — R2689 Other abnormalities of gait and mobility: Secondary | ICD-10-CM | POA: Diagnosis not present

## 2020-03-30 DIAGNOSIS — R1312 Dysphagia, oropharyngeal phase: Secondary | ICD-10-CM | POA: Diagnosis not present

## 2020-03-30 DIAGNOSIS — R2681 Unsteadiness on feet: Secondary | ICD-10-CM | POA: Diagnosis not present

## 2020-03-30 DIAGNOSIS — Z9181 History of falling: Secondary | ICD-10-CM | POA: Diagnosis not present

## 2020-03-30 DIAGNOSIS — M6258 Muscle wasting and atrophy, not elsewhere classified, other site: Secondary | ICD-10-CM | POA: Diagnosis not present

## 2020-03-30 DIAGNOSIS — R41841 Cognitive communication deficit: Secondary | ICD-10-CM | POA: Diagnosis not present

## 2020-03-30 DIAGNOSIS — M6281 Muscle weakness (generalized): Secondary | ICD-10-CM | POA: Diagnosis not present

## 2020-03-31 DIAGNOSIS — E512 Wernicke's encephalopathy: Secondary | ICD-10-CM | POA: Diagnosis not present

## 2020-03-31 DIAGNOSIS — R2689 Other abnormalities of gait and mobility: Secondary | ICD-10-CM | POA: Diagnosis not present

## 2020-03-31 DIAGNOSIS — M6258 Muscle wasting and atrophy, not elsewhere classified, other site: Secondary | ICD-10-CM | POA: Diagnosis not present

## 2020-03-31 DIAGNOSIS — R2681 Unsteadiness on feet: Secondary | ICD-10-CM | POA: Diagnosis not present

## 2020-03-31 DIAGNOSIS — R1312 Dysphagia, oropharyngeal phase: Secondary | ICD-10-CM | POA: Diagnosis not present

## 2020-03-31 DIAGNOSIS — M6281 Muscle weakness (generalized): Secondary | ICD-10-CM | POA: Diagnosis not present

## 2020-03-31 DIAGNOSIS — R41841 Cognitive communication deficit: Secondary | ICD-10-CM | POA: Diagnosis not present

## 2020-03-31 DIAGNOSIS — Z9181 History of falling: Secondary | ICD-10-CM | POA: Diagnosis not present

## 2020-04-01 ENCOUNTER — Ambulatory Visit: Payer: Self-pay | Admitting: Diagnostic Neuroimaging

## 2020-04-01 DIAGNOSIS — R2689 Other abnormalities of gait and mobility: Secondary | ICD-10-CM | POA: Diagnosis not present

## 2020-04-01 DIAGNOSIS — I480 Paroxysmal atrial fibrillation: Secondary | ICD-10-CM | POA: Diagnosis not present

## 2020-04-01 DIAGNOSIS — R1312 Dysphagia, oropharyngeal phase: Secondary | ICD-10-CM | POA: Diagnosis not present

## 2020-04-01 DIAGNOSIS — E512 Wernicke's encephalopathy: Secondary | ICD-10-CM | POA: Diagnosis not present

## 2020-04-01 DIAGNOSIS — R42 Dizziness and giddiness: Secondary | ICD-10-CM | POA: Diagnosis not present

## 2020-04-01 DIAGNOSIS — R41841 Cognitive communication deficit: Secondary | ICD-10-CM | POA: Diagnosis not present

## 2020-04-01 DIAGNOSIS — Z9181 History of falling: Secondary | ICD-10-CM | POA: Diagnosis not present

## 2020-04-01 DIAGNOSIS — F1027 Alcohol dependence with alcohol-induced persisting dementia: Secondary | ICD-10-CM | POA: Diagnosis not present

## 2020-04-01 DIAGNOSIS — M6258 Muscle wasting and atrophy, not elsewhere classified, other site: Secondary | ICD-10-CM | POA: Diagnosis not present

## 2020-04-01 DIAGNOSIS — M6281 Muscle weakness (generalized): Secondary | ICD-10-CM | POA: Diagnosis not present

## 2020-04-01 DIAGNOSIS — R2681 Unsteadiness on feet: Secondary | ICD-10-CM | POA: Diagnosis not present

## 2020-04-02 DIAGNOSIS — R41841 Cognitive communication deficit: Secondary | ICD-10-CM | POA: Diagnosis not present

## 2020-04-02 DIAGNOSIS — R1312 Dysphagia, oropharyngeal phase: Secondary | ICD-10-CM | POA: Diagnosis not present

## 2020-04-02 DIAGNOSIS — Z9181 History of falling: Secondary | ICD-10-CM | POA: Diagnosis not present

## 2020-04-02 DIAGNOSIS — E512 Wernicke's encephalopathy: Secondary | ICD-10-CM | POA: Diagnosis not present

## 2020-04-02 DIAGNOSIS — M6281 Muscle weakness (generalized): Secondary | ICD-10-CM | POA: Diagnosis not present

## 2020-04-02 DIAGNOSIS — R2689 Other abnormalities of gait and mobility: Secondary | ICD-10-CM | POA: Diagnosis not present

## 2020-04-02 DIAGNOSIS — M6258 Muscle wasting and atrophy, not elsewhere classified, other site: Secondary | ICD-10-CM | POA: Diagnosis not present

## 2020-04-02 DIAGNOSIS — R2681 Unsteadiness on feet: Secondary | ICD-10-CM | POA: Diagnosis not present

## 2020-04-03 DIAGNOSIS — Z9181 History of falling: Secondary | ICD-10-CM | POA: Diagnosis not present

## 2020-04-03 DIAGNOSIS — R41841 Cognitive communication deficit: Secondary | ICD-10-CM | POA: Diagnosis not present

## 2020-04-03 DIAGNOSIS — R2689 Other abnormalities of gait and mobility: Secondary | ICD-10-CM | POA: Diagnosis not present

## 2020-04-03 DIAGNOSIS — M6281 Muscle weakness (generalized): Secondary | ICD-10-CM | POA: Diagnosis not present

## 2020-04-03 DIAGNOSIS — M6258 Muscle wasting and atrophy, not elsewhere classified, other site: Secondary | ICD-10-CM | POA: Diagnosis not present

## 2020-04-03 DIAGNOSIS — R2681 Unsteadiness on feet: Secondary | ICD-10-CM | POA: Diagnosis not present

## 2020-04-03 DIAGNOSIS — R1312 Dysphagia, oropharyngeal phase: Secondary | ICD-10-CM | POA: Diagnosis not present

## 2020-04-04 DIAGNOSIS — M6281 Muscle weakness (generalized): Secondary | ICD-10-CM | POA: Diagnosis not present

## 2020-04-04 DIAGNOSIS — R2681 Unsteadiness on feet: Secondary | ICD-10-CM | POA: Diagnosis not present

## 2020-04-04 DIAGNOSIS — R41841 Cognitive communication deficit: Secondary | ICD-10-CM | POA: Diagnosis not present

## 2020-04-04 DIAGNOSIS — M6258 Muscle wasting and atrophy, not elsewhere classified, other site: Secondary | ICD-10-CM | POA: Diagnosis not present

## 2020-04-04 DIAGNOSIS — R2689 Other abnormalities of gait and mobility: Secondary | ICD-10-CM | POA: Diagnosis not present

## 2020-04-04 DIAGNOSIS — Z9181 History of falling: Secondary | ICD-10-CM | POA: Diagnosis not present

## 2020-04-04 DIAGNOSIS — R1312 Dysphagia, oropharyngeal phase: Secondary | ICD-10-CM | POA: Diagnosis not present

## 2020-04-05 DIAGNOSIS — R41841 Cognitive communication deficit: Secondary | ICD-10-CM | POA: Diagnosis not present

## 2020-04-05 DIAGNOSIS — R2681 Unsteadiness on feet: Secondary | ICD-10-CM | POA: Diagnosis not present

## 2020-04-05 DIAGNOSIS — R2689 Other abnormalities of gait and mobility: Secondary | ICD-10-CM | POA: Diagnosis not present

## 2020-04-05 DIAGNOSIS — Z9181 History of falling: Secondary | ICD-10-CM | POA: Diagnosis not present

## 2020-04-05 DIAGNOSIS — M6281 Muscle weakness (generalized): Secondary | ICD-10-CM | POA: Diagnosis not present

## 2020-04-05 DIAGNOSIS — M6258 Muscle wasting and atrophy, not elsewhere classified, other site: Secondary | ICD-10-CM | POA: Diagnosis not present

## 2020-04-05 DIAGNOSIS — R1312 Dysphagia, oropharyngeal phase: Secondary | ICD-10-CM | POA: Diagnosis not present

## 2020-04-07 DIAGNOSIS — R2689 Other abnormalities of gait and mobility: Secondary | ICD-10-CM | POA: Diagnosis not present

## 2020-04-07 DIAGNOSIS — M6258 Muscle wasting and atrophy, not elsewhere classified, other site: Secondary | ICD-10-CM | POA: Diagnosis not present

## 2020-04-07 DIAGNOSIS — Z9181 History of falling: Secondary | ICD-10-CM | POA: Diagnosis not present

## 2020-04-07 DIAGNOSIS — R41841 Cognitive communication deficit: Secondary | ICD-10-CM | POA: Diagnosis not present

## 2020-04-07 DIAGNOSIS — R2681 Unsteadiness on feet: Secondary | ICD-10-CM | POA: Diagnosis not present

## 2020-04-07 DIAGNOSIS — M6281 Muscle weakness (generalized): Secondary | ICD-10-CM | POA: Diagnosis not present

## 2020-04-07 DIAGNOSIS — R1312 Dysphagia, oropharyngeal phase: Secondary | ICD-10-CM | POA: Diagnosis not present

## 2020-04-08 DIAGNOSIS — R2681 Unsteadiness on feet: Secondary | ICD-10-CM | POA: Diagnosis not present

## 2020-04-08 DIAGNOSIS — R2689 Other abnormalities of gait and mobility: Secondary | ICD-10-CM | POA: Diagnosis not present

## 2020-04-08 DIAGNOSIS — M6281 Muscle weakness (generalized): Secondary | ICD-10-CM | POA: Diagnosis not present

## 2020-04-08 DIAGNOSIS — R1312 Dysphagia, oropharyngeal phase: Secondary | ICD-10-CM | POA: Diagnosis not present

## 2020-04-08 DIAGNOSIS — R41841 Cognitive communication deficit: Secondary | ICD-10-CM | POA: Diagnosis not present

## 2020-04-08 DIAGNOSIS — Z9181 History of falling: Secondary | ICD-10-CM | POA: Diagnosis not present

## 2020-04-08 DIAGNOSIS — M6258 Muscle wasting and atrophy, not elsewhere classified, other site: Secondary | ICD-10-CM | POA: Diagnosis not present

## 2020-04-09 DIAGNOSIS — M6258 Muscle wasting and atrophy, not elsewhere classified, other site: Secondary | ICD-10-CM | POA: Diagnosis not present

## 2020-04-09 DIAGNOSIS — R2681 Unsteadiness on feet: Secondary | ICD-10-CM | POA: Diagnosis not present

## 2020-04-09 DIAGNOSIS — M6281 Muscle weakness (generalized): Secondary | ICD-10-CM | POA: Diagnosis not present

## 2020-04-09 DIAGNOSIS — R2689 Other abnormalities of gait and mobility: Secondary | ICD-10-CM | POA: Diagnosis not present

## 2020-04-09 DIAGNOSIS — R41841 Cognitive communication deficit: Secondary | ICD-10-CM | POA: Diagnosis not present

## 2020-04-09 DIAGNOSIS — R1312 Dysphagia, oropharyngeal phase: Secondary | ICD-10-CM | POA: Diagnosis not present

## 2020-04-09 DIAGNOSIS — Z9181 History of falling: Secondary | ICD-10-CM | POA: Diagnosis not present

## 2020-04-10 DIAGNOSIS — I480 Paroxysmal atrial fibrillation: Secondary | ICD-10-CM | POA: Diagnosis not present

## 2020-04-10 DIAGNOSIS — R2689 Other abnormalities of gait and mobility: Secondary | ICD-10-CM | POA: Diagnosis not present

## 2020-04-10 DIAGNOSIS — R41841 Cognitive communication deficit: Secondary | ICD-10-CM | POA: Diagnosis not present

## 2020-04-10 DIAGNOSIS — R5381 Other malaise: Secondary | ICD-10-CM | POA: Diagnosis not present

## 2020-04-10 DIAGNOSIS — M6258 Muscle wasting and atrophy, not elsewhere classified, other site: Secondary | ICD-10-CM | POA: Diagnosis not present

## 2020-04-10 DIAGNOSIS — R008 Other abnormalities of heart beat: Secondary | ICD-10-CM | POA: Diagnosis not present

## 2020-04-10 DIAGNOSIS — Z9181 History of falling: Secondary | ICD-10-CM | POA: Diagnosis not present

## 2020-04-10 DIAGNOSIS — R2681 Unsteadiness on feet: Secondary | ICD-10-CM | POA: Diagnosis not present

## 2020-04-10 DIAGNOSIS — R42 Dizziness and giddiness: Secondary | ICD-10-CM | POA: Diagnosis not present

## 2020-04-10 DIAGNOSIS — R1312 Dysphagia, oropharyngeal phase: Secondary | ICD-10-CM | POA: Diagnosis not present

## 2020-04-10 DIAGNOSIS — M6281 Muscle weakness (generalized): Secondary | ICD-10-CM | POA: Diagnosis not present

## 2020-04-11 DIAGNOSIS — R2681 Unsteadiness on feet: Secondary | ICD-10-CM | POA: Diagnosis not present

## 2020-04-11 DIAGNOSIS — M6258 Muscle wasting and atrophy, not elsewhere classified, other site: Secondary | ICD-10-CM | POA: Diagnosis not present

## 2020-04-11 DIAGNOSIS — R1312 Dysphagia, oropharyngeal phase: Secondary | ICD-10-CM | POA: Diagnosis not present

## 2020-04-11 DIAGNOSIS — R2689 Other abnormalities of gait and mobility: Secondary | ICD-10-CM | POA: Diagnosis not present

## 2020-04-11 DIAGNOSIS — R41841 Cognitive communication deficit: Secondary | ICD-10-CM | POA: Diagnosis not present

## 2020-04-11 DIAGNOSIS — Z9181 History of falling: Secondary | ICD-10-CM | POA: Diagnosis not present

## 2020-04-11 DIAGNOSIS — M6281 Muscle weakness (generalized): Secondary | ICD-10-CM | POA: Diagnosis not present

## 2020-04-12 DIAGNOSIS — M6281 Muscle weakness (generalized): Secondary | ICD-10-CM | POA: Diagnosis not present

## 2020-04-12 DIAGNOSIS — R41841 Cognitive communication deficit: Secondary | ICD-10-CM | POA: Diagnosis not present

## 2020-04-12 DIAGNOSIS — Z9181 History of falling: Secondary | ICD-10-CM | POA: Diagnosis not present

## 2020-04-12 DIAGNOSIS — M6258 Muscle wasting and atrophy, not elsewhere classified, other site: Secondary | ICD-10-CM | POA: Diagnosis not present

## 2020-04-12 DIAGNOSIS — R2681 Unsteadiness on feet: Secondary | ICD-10-CM | POA: Diagnosis not present

## 2020-04-12 DIAGNOSIS — R2689 Other abnormalities of gait and mobility: Secondary | ICD-10-CM | POA: Diagnosis not present

## 2020-04-12 DIAGNOSIS — R1312 Dysphagia, oropharyngeal phase: Secondary | ICD-10-CM | POA: Diagnosis not present

## 2020-04-13 DIAGNOSIS — R1312 Dysphagia, oropharyngeal phase: Secondary | ICD-10-CM | POA: Diagnosis not present

## 2020-04-13 DIAGNOSIS — Z9181 History of falling: Secondary | ICD-10-CM | POA: Diagnosis not present

## 2020-04-13 DIAGNOSIS — M6258 Muscle wasting and atrophy, not elsewhere classified, other site: Secondary | ICD-10-CM | POA: Diagnosis not present

## 2020-04-13 DIAGNOSIS — R2689 Other abnormalities of gait and mobility: Secondary | ICD-10-CM | POA: Diagnosis not present

## 2020-04-13 DIAGNOSIS — R41841 Cognitive communication deficit: Secondary | ICD-10-CM | POA: Diagnosis not present

## 2020-04-13 DIAGNOSIS — R2681 Unsteadiness on feet: Secondary | ICD-10-CM | POA: Diagnosis not present

## 2020-04-13 DIAGNOSIS — M6281 Muscle weakness (generalized): Secondary | ICD-10-CM | POA: Diagnosis not present

## 2020-04-14 DIAGNOSIS — M6258 Muscle wasting and atrophy, not elsewhere classified, other site: Secondary | ICD-10-CM | POA: Diagnosis not present

## 2020-04-14 DIAGNOSIS — R2689 Other abnormalities of gait and mobility: Secondary | ICD-10-CM | POA: Diagnosis not present

## 2020-04-14 DIAGNOSIS — R2681 Unsteadiness on feet: Secondary | ICD-10-CM | POA: Diagnosis not present

## 2020-04-14 DIAGNOSIS — R41841 Cognitive communication deficit: Secondary | ICD-10-CM | POA: Diagnosis not present

## 2020-04-14 DIAGNOSIS — R1312 Dysphagia, oropharyngeal phase: Secondary | ICD-10-CM | POA: Diagnosis not present

## 2020-04-14 DIAGNOSIS — Z9181 History of falling: Secondary | ICD-10-CM | POA: Diagnosis not present

## 2020-04-14 DIAGNOSIS — M6281 Muscle weakness (generalized): Secondary | ICD-10-CM | POA: Diagnosis not present

## 2020-04-15 DIAGNOSIS — R41841 Cognitive communication deficit: Secondary | ICD-10-CM | POA: Diagnosis not present

## 2020-04-15 DIAGNOSIS — R2689 Other abnormalities of gait and mobility: Secondary | ICD-10-CM | POA: Diagnosis not present

## 2020-04-15 DIAGNOSIS — Z9181 History of falling: Secondary | ICD-10-CM | POA: Diagnosis not present

## 2020-04-15 DIAGNOSIS — M6258 Muscle wasting and atrophy, not elsewhere classified, other site: Secondary | ICD-10-CM | POA: Diagnosis not present

## 2020-04-15 DIAGNOSIS — R2681 Unsteadiness on feet: Secondary | ICD-10-CM | POA: Diagnosis not present

## 2020-04-15 DIAGNOSIS — R1312 Dysphagia, oropharyngeal phase: Secondary | ICD-10-CM | POA: Diagnosis not present

## 2020-04-15 DIAGNOSIS — M6281 Muscle weakness (generalized): Secondary | ICD-10-CM | POA: Diagnosis not present

## 2020-04-16 DIAGNOSIS — R1312 Dysphagia, oropharyngeal phase: Secondary | ICD-10-CM | POA: Diagnosis not present

## 2020-04-16 DIAGNOSIS — R2689 Other abnormalities of gait and mobility: Secondary | ICD-10-CM | POA: Diagnosis not present

## 2020-04-16 DIAGNOSIS — M6281 Muscle weakness (generalized): Secondary | ICD-10-CM | POA: Diagnosis not present

## 2020-04-16 DIAGNOSIS — R2681 Unsteadiness on feet: Secondary | ICD-10-CM | POA: Diagnosis not present

## 2020-04-16 DIAGNOSIS — R41841 Cognitive communication deficit: Secondary | ICD-10-CM | POA: Diagnosis not present

## 2020-04-16 DIAGNOSIS — M6258 Muscle wasting and atrophy, not elsewhere classified, other site: Secondary | ICD-10-CM | POA: Diagnosis not present

## 2020-04-16 DIAGNOSIS — Z9181 History of falling: Secondary | ICD-10-CM | POA: Diagnosis not present

## 2020-04-17 DIAGNOSIS — M6258 Muscle wasting and atrophy, not elsewhere classified, other site: Secondary | ICD-10-CM | POA: Diagnosis not present

## 2020-04-17 DIAGNOSIS — M6281 Muscle weakness (generalized): Secondary | ICD-10-CM | POA: Diagnosis not present

## 2020-04-17 DIAGNOSIS — Z9181 History of falling: Secondary | ICD-10-CM | POA: Diagnosis not present

## 2020-04-17 DIAGNOSIS — R2681 Unsteadiness on feet: Secondary | ICD-10-CM | POA: Diagnosis not present

## 2020-04-17 DIAGNOSIS — R2689 Other abnormalities of gait and mobility: Secondary | ICD-10-CM | POA: Diagnosis not present

## 2020-04-17 DIAGNOSIS — R41841 Cognitive communication deficit: Secondary | ICD-10-CM | POA: Diagnosis not present

## 2020-04-17 DIAGNOSIS — R1312 Dysphagia, oropharyngeal phase: Secondary | ICD-10-CM | POA: Diagnosis not present

## 2020-04-18 DIAGNOSIS — R2689 Other abnormalities of gait and mobility: Secondary | ICD-10-CM | POA: Diagnosis not present

## 2020-04-18 DIAGNOSIS — M6281 Muscle weakness (generalized): Secondary | ICD-10-CM | POA: Diagnosis not present

## 2020-04-18 DIAGNOSIS — R41841 Cognitive communication deficit: Secondary | ICD-10-CM | POA: Diagnosis not present

## 2020-04-18 DIAGNOSIS — M6258 Muscle wasting and atrophy, not elsewhere classified, other site: Secondary | ICD-10-CM | POA: Diagnosis not present

## 2020-04-18 DIAGNOSIS — R2681 Unsteadiness on feet: Secondary | ICD-10-CM | POA: Diagnosis not present

## 2020-04-18 DIAGNOSIS — Z9181 History of falling: Secondary | ICD-10-CM | POA: Diagnosis not present

## 2020-04-18 DIAGNOSIS — R1312 Dysphagia, oropharyngeal phase: Secondary | ICD-10-CM | POA: Diagnosis not present

## 2020-04-19 DIAGNOSIS — Z9181 History of falling: Secondary | ICD-10-CM | POA: Diagnosis not present

## 2020-04-19 DIAGNOSIS — R1312 Dysphagia, oropharyngeal phase: Secondary | ICD-10-CM | POA: Diagnosis not present

## 2020-04-19 DIAGNOSIS — M6281 Muscle weakness (generalized): Secondary | ICD-10-CM | POA: Diagnosis not present

## 2020-04-19 DIAGNOSIS — M6258 Muscle wasting and atrophy, not elsewhere classified, other site: Secondary | ICD-10-CM | POA: Diagnosis not present

## 2020-04-19 DIAGNOSIS — R41841 Cognitive communication deficit: Secondary | ICD-10-CM | POA: Diagnosis not present

## 2020-04-19 DIAGNOSIS — R2689 Other abnormalities of gait and mobility: Secondary | ICD-10-CM | POA: Diagnosis not present

## 2020-04-19 DIAGNOSIS — R2681 Unsteadiness on feet: Secondary | ICD-10-CM | POA: Diagnosis not present

## 2020-04-20 DIAGNOSIS — R2689 Other abnormalities of gait and mobility: Secondary | ICD-10-CM | POA: Diagnosis not present

## 2020-04-20 DIAGNOSIS — M6258 Muscle wasting and atrophy, not elsewhere classified, other site: Secondary | ICD-10-CM | POA: Diagnosis not present

## 2020-04-20 DIAGNOSIS — Z9181 History of falling: Secondary | ICD-10-CM | POA: Diagnosis not present

## 2020-04-20 DIAGNOSIS — M6281 Muscle weakness (generalized): Secondary | ICD-10-CM | POA: Diagnosis not present

## 2020-04-20 DIAGNOSIS — R1312 Dysphagia, oropharyngeal phase: Secondary | ICD-10-CM | POA: Diagnosis not present

## 2020-04-20 DIAGNOSIS — R41841 Cognitive communication deficit: Secondary | ICD-10-CM | POA: Diagnosis not present

## 2020-04-20 DIAGNOSIS — R2681 Unsteadiness on feet: Secondary | ICD-10-CM | POA: Diagnosis not present

## 2020-04-21 ENCOUNTER — Ambulatory Visit (INDEPENDENT_AMBULATORY_CARE_PROVIDER_SITE_OTHER): Payer: Medicare Other | Admitting: *Deleted

## 2020-04-21 DIAGNOSIS — Z9181 History of falling: Secondary | ICD-10-CM | POA: Diagnosis not present

## 2020-04-21 DIAGNOSIS — R2681 Unsteadiness on feet: Secondary | ICD-10-CM | POA: Diagnosis not present

## 2020-04-21 DIAGNOSIS — R2689 Other abnormalities of gait and mobility: Secondary | ICD-10-CM | POA: Diagnosis not present

## 2020-04-21 DIAGNOSIS — M6281 Muscle weakness (generalized): Secondary | ICD-10-CM | POA: Diagnosis not present

## 2020-04-21 DIAGNOSIS — R1312 Dysphagia, oropharyngeal phase: Secondary | ICD-10-CM | POA: Diagnosis not present

## 2020-04-21 DIAGNOSIS — I4891 Unspecified atrial fibrillation: Secondary | ICD-10-CM | POA: Diagnosis not present

## 2020-04-21 DIAGNOSIS — M6258 Muscle wasting and atrophy, not elsewhere classified, other site: Secondary | ICD-10-CM | POA: Diagnosis not present

## 2020-04-21 DIAGNOSIS — R41841 Cognitive communication deficit: Secondary | ICD-10-CM | POA: Diagnosis not present

## 2020-04-21 LAB — CUP PACEART REMOTE DEVICE CHECK
Date Time Interrogation Session: 20210718231311
Implantable Pulse Generator Implant Date: 20200928

## 2020-04-22 DIAGNOSIS — R41841 Cognitive communication deficit: Secondary | ICD-10-CM | POA: Diagnosis not present

## 2020-04-22 DIAGNOSIS — Z9181 History of falling: Secondary | ICD-10-CM | POA: Diagnosis not present

## 2020-04-22 DIAGNOSIS — R2689 Other abnormalities of gait and mobility: Secondary | ICD-10-CM | POA: Diagnosis not present

## 2020-04-22 DIAGNOSIS — R1312 Dysphagia, oropharyngeal phase: Secondary | ICD-10-CM | POA: Diagnosis not present

## 2020-04-22 DIAGNOSIS — F4310 Post-traumatic stress disorder, unspecified: Secondary | ICD-10-CM | POA: Diagnosis not present

## 2020-04-22 DIAGNOSIS — M6258 Muscle wasting and atrophy, not elsewhere classified, other site: Secondary | ICD-10-CM | POA: Diagnosis not present

## 2020-04-22 DIAGNOSIS — M6281 Muscle weakness (generalized): Secondary | ICD-10-CM | POA: Diagnosis not present

## 2020-04-22 DIAGNOSIS — R2681 Unsteadiness on feet: Secondary | ICD-10-CM | POA: Diagnosis not present

## 2020-04-22 DIAGNOSIS — F329 Major depressive disorder, single episode, unspecified: Secondary | ICD-10-CM | POA: Diagnosis not present

## 2020-04-22 DIAGNOSIS — Z8669 Personal history of other diseases of the nervous system and sense organs: Secondary | ICD-10-CM | POA: Diagnosis not present

## 2020-04-22 DIAGNOSIS — F1011 Alcohol abuse, in remission: Secondary | ICD-10-CM | POA: Diagnosis not present

## 2020-04-22 NOTE — Progress Notes (Signed)
[facility] Summary Report / Loop Recorder 

## 2020-04-23 DIAGNOSIS — M6281 Muscle weakness (generalized): Secondary | ICD-10-CM | POA: Diagnosis not present

## 2020-04-23 DIAGNOSIS — R41841 Cognitive communication deficit: Secondary | ICD-10-CM | POA: Diagnosis not present

## 2020-04-23 DIAGNOSIS — M6258 Muscle wasting and atrophy, not elsewhere classified, other site: Secondary | ICD-10-CM | POA: Diagnosis not present

## 2020-04-23 DIAGNOSIS — R2689 Other abnormalities of gait and mobility: Secondary | ICD-10-CM | POA: Diagnosis not present

## 2020-04-23 DIAGNOSIS — R1312 Dysphagia, oropharyngeal phase: Secondary | ICD-10-CM | POA: Diagnosis not present

## 2020-04-23 DIAGNOSIS — R2681 Unsteadiness on feet: Secondary | ICD-10-CM | POA: Diagnosis not present

## 2020-04-23 DIAGNOSIS — Z9181 History of falling: Secondary | ICD-10-CM | POA: Diagnosis not present

## 2020-04-24 DIAGNOSIS — Z9181 History of falling: Secondary | ICD-10-CM | POA: Diagnosis not present

## 2020-04-24 DIAGNOSIS — R1312 Dysphagia, oropharyngeal phase: Secondary | ICD-10-CM | POA: Diagnosis not present

## 2020-04-24 DIAGNOSIS — M6281 Muscle weakness (generalized): Secondary | ICD-10-CM | POA: Diagnosis not present

## 2020-04-24 DIAGNOSIS — R2681 Unsteadiness on feet: Secondary | ICD-10-CM | POA: Diagnosis not present

## 2020-04-24 DIAGNOSIS — M6258 Muscle wasting and atrophy, not elsewhere classified, other site: Secondary | ICD-10-CM | POA: Diagnosis not present

## 2020-04-24 DIAGNOSIS — R41841 Cognitive communication deficit: Secondary | ICD-10-CM | POA: Diagnosis not present

## 2020-04-24 DIAGNOSIS — R2689 Other abnormalities of gait and mobility: Secondary | ICD-10-CM | POA: Diagnosis not present

## 2020-04-25 ENCOUNTER — Ambulatory Visit: Payer: Medicare Other | Admitting: Family Medicine

## 2020-04-25 DIAGNOSIS — Z9181 History of falling: Secondary | ICD-10-CM | POA: Diagnosis not present

## 2020-04-25 DIAGNOSIS — A040 Enteropathogenic Escherichia coli infection: Secondary | ICD-10-CM | POA: Diagnosis not present

## 2020-04-25 DIAGNOSIS — R41841 Cognitive communication deficit: Secondary | ICD-10-CM | POA: Diagnosis not present

## 2020-04-25 DIAGNOSIS — R2689 Other abnormalities of gait and mobility: Secondary | ICD-10-CM | POA: Diagnosis not present

## 2020-04-25 DIAGNOSIS — M6281 Muscle weakness (generalized): Secondary | ICD-10-CM | POA: Diagnosis not present

## 2020-04-25 DIAGNOSIS — R2681 Unsteadiness on feet: Secondary | ICD-10-CM | POA: Diagnosis not present

## 2020-04-25 DIAGNOSIS — M6258 Muscle wasting and atrophy, not elsewhere classified, other site: Secondary | ICD-10-CM | POA: Diagnosis not present

## 2020-04-25 DIAGNOSIS — R1312 Dysphagia, oropharyngeal phase: Secondary | ICD-10-CM | POA: Diagnosis not present

## 2020-04-26 DIAGNOSIS — R41841 Cognitive communication deficit: Secondary | ICD-10-CM | POA: Diagnosis not present

## 2020-04-26 DIAGNOSIS — Z9181 History of falling: Secondary | ICD-10-CM | POA: Diagnosis not present

## 2020-04-26 DIAGNOSIS — R2681 Unsteadiness on feet: Secondary | ICD-10-CM | POA: Diagnosis not present

## 2020-04-26 DIAGNOSIS — M6258 Muscle wasting and atrophy, not elsewhere classified, other site: Secondary | ICD-10-CM | POA: Diagnosis not present

## 2020-04-26 DIAGNOSIS — R1312 Dysphagia, oropharyngeal phase: Secondary | ICD-10-CM | POA: Diagnosis not present

## 2020-04-26 DIAGNOSIS — M6281 Muscle weakness (generalized): Secondary | ICD-10-CM | POA: Diagnosis not present

## 2020-04-26 DIAGNOSIS — R2689 Other abnormalities of gait and mobility: Secondary | ICD-10-CM | POA: Diagnosis not present

## 2020-04-27 DIAGNOSIS — R2689 Other abnormalities of gait and mobility: Secondary | ICD-10-CM | POA: Diagnosis not present

## 2020-04-27 DIAGNOSIS — M6281 Muscle weakness (generalized): Secondary | ICD-10-CM | POA: Diagnosis not present

## 2020-04-27 DIAGNOSIS — M6258 Muscle wasting and atrophy, not elsewhere classified, other site: Secondary | ICD-10-CM | POA: Diagnosis not present

## 2020-04-27 DIAGNOSIS — R2681 Unsteadiness on feet: Secondary | ICD-10-CM | POA: Diagnosis not present

## 2020-04-27 DIAGNOSIS — Z9181 History of falling: Secondary | ICD-10-CM | POA: Diagnosis not present

## 2020-04-27 DIAGNOSIS — R1312 Dysphagia, oropharyngeal phase: Secondary | ICD-10-CM | POA: Diagnosis not present

## 2020-04-27 DIAGNOSIS — R41841 Cognitive communication deficit: Secondary | ICD-10-CM | POA: Diagnosis not present

## 2020-04-28 DIAGNOSIS — M6281 Muscle weakness (generalized): Secondary | ICD-10-CM | POA: Diagnosis not present

## 2020-04-28 DIAGNOSIS — Z9181 History of falling: Secondary | ICD-10-CM | POA: Diagnosis not present

## 2020-04-28 DIAGNOSIS — M545 Low back pain: Secondary | ICD-10-CM | POA: Diagnosis not present

## 2020-04-28 DIAGNOSIS — R1312 Dysphagia, oropharyngeal phase: Secondary | ICD-10-CM | POA: Diagnosis not present

## 2020-04-28 DIAGNOSIS — R2681 Unsteadiness on feet: Secondary | ICD-10-CM | POA: Diagnosis not present

## 2020-04-28 DIAGNOSIS — M6258 Muscle wasting and atrophy, not elsewhere classified, other site: Secondary | ICD-10-CM | POA: Diagnosis not present

## 2020-04-28 DIAGNOSIS — R197 Diarrhea, unspecified: Secondary | ICD-10-CM | POA: Diagnosis not present

## 2020-04-28 DIAGNOSIS — R2689 Other abnormalities of gait and mobility: Secondary | ICD-10-CM | POA: Diagnosis not present

## 2020-04-28 DIAGNOSIS — R41841 Cognitive communication deficit: Secondary | ICD-10-CM | POA: Diagnosis not present

## 2020-04-29 DIAGNOSIS — M6281 Muscle weakness (generalized): Secondary | ICD-10-CM | POA: Diagnosis not present

## 2020-04-29 DIAGNOSIS — Z9181 History of falling: Secondary | ICD-10-CM | POA: Diagnosis not present

## 2020-04-29 DIAGNOSIS — M6258 Muscle wasting and atrophy, not elsewhere classified, other site: Secondary | ICD-10-CM | POA: Diagnosis not present

## 2020-04-29 DIAGNOSIS — R2681 Unsteadiness on feet: Secondary | ICD-10-CM | POA: Diagnosis not present

## 2020-04-29 DIAGNOSIS — R41841 Cognitive communication deficit: Secondary | ICD-10-CM | POA: Diagnosis not present

## 2020-04-29 DIAGNOSIS — R1312 Dysphagia, oropharyngeal phase: Secondary | ICD-10-CM | POA: Diagnosis not present

## 2020-04-29 DIAGNOSIS — R2689 Other abnormalities of gait and mobility: Secondary | ICD-10-CM | POA: Diagnosis not present

## 2020-04-30 DIAGNOSIS — R2681 Unsteadiness on feet: Secondary | ICD-10-CM | POA: Diagnosis not present

## 2020-04-30 DIAGNOSIS — M6258 Muscle wasting and atrophy, not elsewhere classified, other site: Secondary | ICD-10-CM | POA: Diagnosis not present

## 2020-04-30 DIAGNOSIS — R2689 Other abnormalities of gait and mobility: Secondary | ICD-10-CM | POA: Diagnosis not present

## 2020-04-30 DIAGNOSIS — M6281 Muscle weakness (generalized): Secondary | ICD-10-CM | POA: Diagnosis not present

## 2020-04-30 DIAGNOSIS — R1312 Dysphagia, oropharyngeal phase: Secondary | ICD-10-CM | POA: Diagnosis not present

## 2020-04-30 DIAGNOSIS — R41841 Cognitive communication deficit: Secondary | ICD-10-CM | POA: Diagnosis not present

## 2020-04-30 DIAGNOSIS — Z9181 History of falling: Secondary | ICD-10-CM | POA: Diagnosis not present

## 2020-05-01 DIAGNOSIS — R41841 Cognitive communication deficit: Secondary | ICD-10-CM | POA: Diagnosis not present

## 2020-05-01 DIAGNOSIS — R2681 Unsteadiness on feet: Secondary | ICD-10-CM | POA: Diagnosis not present

## 2020-05-01 DIAGNOSIS — Z9181 History of falling: Secondary | ICD-10-CM | POA: Diagnosis not present

## 2020-05-01 DIAGNOSIS — M6258 Muscle wasting and atrophy, not elsewhere classified, other site: Secondary | ICD-10-CM | POA: Diagnosis not present

## 2020-05-01 DIAGNOSIS — R2689 Other abnormalities of gait and mobility: Secondary | ICD-10-CM | POA: Diagnosis not present

## 2020-05-01 DIAGNOSIS — R1312 Dysphagia, oropharyngeal phase: Secondary | ICD-10-CM | POA: Diagnosis not present

## 2020-05-01 DIAGNOSIS — M6281 Muscle weakness (generalized): Secondary | ICD-10-CM | POA: Diagnosis not present

## 2020-05-02 DIAGNOSIS — Z79899 Other long term (current) drug therapy: Secondary | ICD-10-CM | POA: Diagnosis not present

## 2020-05-02 DIAGNOSIS — Z8669 Personal history of other diseases of the nervous system and sense organs: Secondary | ICD-10-CM | POA: Diagnosis not present

## 2020-05-02 DIAGNOSIS — K219 Gastro-esophageal reflux disease without esophagitis: Secondary | ICD-10-CM | POA: Diagnosis not present

## 2020-05-02 DIAGNOSIS — E440 Moderate protein-calorie malnutrition: Secondary | ICD-10-CM | POA: Diagnosis not present

## 2020-05-02 DIAGNOSIS — E1110 Type 2 diabetes mellitus with ketoacidosis without coma: Secondary | ICD-10-CM | POA: Diagnosis not present

## 2020-05-02 DIAGNOSIS — F1011 Alcohol abuse, in remission: Secondary | ICD-10-CM | POA: Diagnosis not present

## 2020-05-02 DIAGNOSIS — R1312 Dysphagia, oropharyngeal phase: Secondary | ICD-10-CM | POA: Diagnosis not present

## 2020-05-02 DIAGNOSIS — R2689 Other abnormalities of gait and mobility: Secondary | ICD-10-CM | POA: Diagnosis not present

## 2020-05-02 DIAGNOSIS — R41841 Cognitive communication deficit: Secondary | ICD-10-CM | POA: Diagnosis not present

## 2020-05-02 DIAGNOSIS — F4310 Post-traumatic stress disorder, unspecified: Secondary | ICD-10-CM | POA: Diagnosis not present

## 2020-05-02 DIAGNOSIS — R2681 Unsteadiness on feet: Secondary | ICD-10-CM | POA: Diagnosis not present

## 2020-05-02 DIAGNOSIS — F329 Major depressive disorder, single episode, unspecified: Secondary | ICD-10-CM | POA: Diagnosis not present

## 2020-05-02 DIAGNOSIS — Z9181 History of falling: Secondary | ICD-10-CM | POA: Diagnosis not present

## 2020-05-02 DIAGNOSIS — M6281 Muscle weakness (generalized): Secondary | ICD-10-CM | POA: Diagnosis not present

## 2020-05-02 DIAGNOSIS — M6258 Muscle wasting and atrophy, not elsewhere classified, other site: Secondary | ICD-10-CM | POA: Diagnosis not present

## 2020-05-03 DIAGNOSIS — Z9181 History of falling: Secondary | ICD-10-CM | POA: Diagnosis not present

## 2020-05-03 DIAGNOSIS — R41841 Cognitive communication deficit: Secondary | ICD-10-CM | POA: Diagnosis not present

## 2020-05-03 DIAGNOSIS — R2681 Unsteadiness on feet: Secondary | ICD-10-CM | POA: Diagnosis not present

## 2020-05-03 DIAGNOSIS — M6258 Muscle wasting and atrophy, not elsewhere classified, other site: Secondary | ICD-10-CM | POA: Diagnosis not present

## 2020-05-03 DIAGNOSIS — R1312 Dysphagia, oropharyngeal phase: Secondary | ICD-10-CM | POA: Diagnosis not present

## 2020-05-03 DIAGNOSIS — M6281 Muscle weakness (generalized): Secondary | ICD-10-CM | POA: Diagnosis not present

## 2020-05-03 DIAGNOSIS — R2689 Other abnormalities of gait and mobility: Secondary | ICD-10-CM | POA: Diagnosis not present

## 2020-05-04 DIAGNOSIS — Z9181 History of falling: Secondary | ICD-10-CM | POA: Diagnosis not present

## 2020-05-04 DIAGNOSIS — R1312 Dysphagia, oropharyngeal phase: Secondary | ICD-10-CM | POA: Diagnosis not present

## 2020-05-04 DIAGNOSIS — R2681 Unsteadiness on feet: Secondary | ICD-10-CM | POA: Diagnosis not present

## 2020-05-04 DIAGNOSIS — R2689 Other abnormalities of gait and mobility: Secondary | ICD-10-CM | POA: Diagnosis not present

## 2020-05-04 DIAGNOSIS — R41841 Cognitive communication deficit: Secondary | ICD-10-CM | POA: Diagnosis not present

## 2020-05-04 DIAGNOSIS — M6281 Muscle weakness (generalized): Secondary | ICD-10-CM | POA: Diagnosis not present

## 2020-05-04 DIAGNOSIS — E512 Wernicke's encephalopathy: Secondary | ICD-10-CM | POA: Diagnosis not present

## 2020-05-04 DIAGNOSIS — M6258 Muscle wasting and atrophy, not elsewhere classified, other site: Secondary | ICD-10-CM | POA: Diagnosis not present

## 2020-05-05 DIAGNOSIS — M6281 Muscle weakness (generalized): Secondary | ICD-10-CM | POA: Diagnosis not present

## 2020-05-05 DIAGNOSIS — R2681 Unsteadiness on feet: Secondary | ICD-10-CM | POA: Diagnosis not present

## 2020-05-05 DIAGNOSIS — R2689 Other abnormalities of gait and mobility: Secondary | ICD-10-CM | POA: Diagnosis not present

## 2020-05-05 DIAGNOSIS — M6258 Muscle wasting and atrophy, not elsewhere classified, other site: Secondary | ICD-10-CM | POA: Diagnosis not present

## 2020-05-05 DIAGNOSIS — R1312 Dysphagia, oropharyngeal phase: Secondary | ICD-10-CM | POA: Diagnosis not present

## 2020-05-05 DIAGNOSIS — Z9181 History of falling: Secondary | ICD-10-CM | POA: Diagnosis not present

## 2020-05-05 DIAGNOSIS — R41841 Cognitive communication deficit: Secondary | ICD-10-CM | POA: Diagnosis not present

## 2020-05-05 DIAGNOSIS — E512 Wernicke's encephalopathy: Secondary | ICD-10-CM | POA: Diagnosis not present

## 2020-05-06 DIAGNOSIS — R41841 Cognitive communication deficit: Secondary | ICD-10-CM | POA: Diagnosis not present

## 2020-05-06 DIAGNOSIS — R2681 Unsteadiness on feet: Secondary | ICD-10-CM | POA: Diagnosis not present

## 2020-05-06 DIAGNOSIS — M545 Low back pain: Secondary | ICD-10-CM | POA: Diagnosis not present

## 2020-05-06 DIAGNOSIS — I10 Essential (primary) hypertension: Secondary | ICD-10-CM | POA: Diagnosis not present

## 2020-05-06 DIAGNOSIS — E512 Wernicke's encephalopathy: Secondary | ICD-10-CM | POA: Diagnosis not present

## 2020-05-06 DIAGNOSIS — Z9181 History of falling: Secondary | ICD-10-CM | POA: Diagnosis not present

## 2020-05-06 DIAGNOSIS — E119 Type 2 diabetes mellitus without complications: Secondary | ICD-10-CM | POA: Diagnosis not present

## 2020-05-06 DIAGNOSIS — E785 Hyperlipidemia, unspecified: Secondary | ICD-10-CM | POA: Diagnosis not present

## 2020-05-06 DIAGNOSIS — R2689 Other abnormalities of gait and mobility: Secondary | ICD-10-CM | POA: Diagnosis not present

## 2020-05-06 DIAGNOSIS — M6258 Muscle wasting and atrophy, not elsewhere classified, other site: Secondary | ICD-10-CM | POA: Diagnosis not present

## 2020-05-06 DIAGNOSIS — R1312 Dysphagia, oropharyngeal phase: Secondary | ICD-10-CM | POA: Diagnosis not present

## 2020-05-06 DIAGNOSIS — M6281 Muscle weakness (generalized): Secondary | ICD-10-CM | POA: Diagnosis not present

## 2020-05-07 DIAGNOSIS — M6281 Muscle weakness (generalized): Secondary | ICD-10-CM | POA: Diagnosis not present

## 2020-05-07 DIAGNOSIS — R1312 Dysphagia, oropharyngeal phase: Secondary | ICD-10-CM | POA: Diagnosis not present

## 2020-05-07 DIAGNOSIS — R41841 Cognitive communication deficit: Secondary | ICD-10-CM | POA: Diagnosis not present

## 2020-05-07 DIAGNOSIS — E512 Wernicke's encephalopathy: Secondary | ICD-10-CM | POA: Diagnosis not present

## 2020-05-07 DIAGNOSIS — M6258 Muscle wasting and atrophy, not elsewhere classified, other site: Secondary | ICD-10-CM | POA: Diagnosis not present

## 2020-05-07 DIAGNOSIS — R2689 Other abnormalities of gait and mobility: Secondary | ICD-10-CM | POA: Diagnosis not present

## 2020-05-07 DIAGNOSIS — R2681 Unsteadiness on feet: Secondary | ICD-10-CM | POA: Diagnosis not present

## 2020-05-07 DIAGNOSIS — Z9181 History of falling: Secondary | ICD-10-CM | POA: Diagnosis not present

## 2020-05-08 DIAGNOSIS — R2689 Other abnormalities of gait and mobility: Secondary | ICD-10-CM | POA: Diagnosis not present

## 2020-05-08 DIAGNOSIS — R1312 Dysphagia, oropharyngeal phase: Secondary | ICD-10-CM | POA: Diagnosis not present

## 2020-05-08 DIAGNOSIS — R41841 Cognitive communication deficit: Secondary | ICD-10-CM | POA: Diagnosis not present

## 2020-05-08 DIAGNOSIS — M6258 Muscle wasting and atrophy, not elsewhere classified, other site: Secondary | ICD-10-CM | POA: Diagnosis not present

## 2020-05-08 DIAGNOSIS — Z9181 History of falling: Secondary | ICD-10-CM | POA: Diagnosis not present

## 2020-05-08 DIAGNOSIS — D649 Anemia, unspecified: Secondary | ICD-10-CM | POA: Diagnosis not present

## 2020-05-08 DIAGNOSIS — M6281 Muscle weakness (generalized): Secondary | ICD-10-CM | POA: Diagnosis not present

## 2020-05-08 DIAGNOSIS — E512 Wernicke's encephalopathy: Secondary | ICD-10-CM | POA: Diagnosis not present

## 2020-05-08 DIAGNOSIS — R2681 Unsteadiness on feet: Secondary | ICD-10-CM | POA: Diagnosis not present

## 2020-05-09 DIAGNOSIS — Z9181 History of falling: Secondary | ICD-10-CM | POA: Diagnosis not present

## 2020-05-09 DIAGNOSIS — M6281 Muscle weakness (generalized): Secondary | ICD-10-CM | POA: Diagnosis not present

## 2020-05-09 DIAGNOSIS — R2681 Unsteadiness on feet: Secondary | ICD-10-CM | POA: Diagnosis not present

## 2020-05-09 DIAGNOSIS — R1312 Dysphagia, oropharyngeal phase: Secondary | ICD-10-CM | POA: Diagnosis not present

## 2020-05-09 DIAGNOSIS — R2689 Other abnormalities of gait and mobility: Secondary | ICD-10-CM | POA: Diagnosis not present

## 2020-05-09 DIAGNOSIS — M6258 Muscle wasting and atrophy, not elsewhere classified, other site: Secondary | ICD-10-CM | POA: Diagnosis not present

## 2020-05-09 DIAGNOSIS — E512 Wernicke's encephalopathy: Secondary | ICD-10-CM | POA: Diagnosis not present

## 2020-05-09 DIAGNOSIS — R41841 Cognitive communication deficit: Secondary | ICD-10-CM | POA: Diagnosis not present

## 2020-05-10 DIAGNOSIS — M6281 Muscle weakness (generalized): Secondary | ICD-10-CM | POA: Diagnosis not present

## 2020-05-10 DIAGNOSIS — Z9181 History of falling: Secondary | ICD-10-CM | POA: Diagnosis not present

## 2020-05-10 DIAGNOSIS — R1312 Dysphagia, oropharyngeal phase: Secondary | ICD-10-CM | POA: Diagnosis not present

## 2020-05-10 DIAGNOSIS — R2681 Unsteadiness on feet: Secondary | ICD-10-CM | POA: Diagnosis not present

## 2020-05-10 DIAGNOSIS — R2689 Other abnormalities of gait and mobility: Secondary | ICD-10-CM | POA: Diagnosis not present

## 2020-05-10 DIAGNOSIS — E512 Wernicke's encephalopathy: Secondary | ICD-10-CM | POA: Diagnosis not present

## 2020-05-10 DIAGNOSIS — R41841 Cognitive communication deficit: Secondary | ICD-10-CM | POA: Diagnosis not present

## 2020-05-10 DIAGNOSIS — M6258 Muscle wasting and atrophy, not elsewhere classified, other site: Secondary | ICD-10-CM | POA: Diagnosis not present

## 2020-05-11 DIAGNOSIS — E512 Wernicke's encephalopathy: Secondary | ICD-10-CM | POA: Diagnosis not present

## 2020-05-11 DIAGNOSIS — M6281 Muscle weakness (generalized): Secondary | ICD-10-CM | POA: Diagnosis not present

## 2020-05-11 DIAGNOSIS — Z9181 History of falling: Secondary | ICD-10-CM | POA: Diagnosis not present

## 2020-05-11 DIAGNOSIS — R2681 Unsteadiness on feet: Secondary | ICD-10-CM | POA: Diagnosis not present

## 2020-05-11 DIAGNOSIS — M6258 Muscle wasting and atrophy, not elsewhere classified, other site: Secondary | ICD-10-CM | POA: Diagnosis not present

## 2020-05-11 DIAGNOSIS — R1312 Dysphagia, oropharyngeal phase: Secondary | ICD-10-CM | POA: Diagnosis not present

## 2020-05-11 DIAGNOSIS — R41841 Cognitive communication deficit: Secondary | ICD-10-CM | POA: Diagnosis not present

## 2020-05-11 DIAGNOSIS — R2689 Other abnormalities of gait and mobility: Secondary | ICD-10-CM | POA: Diagnosis not present

## 2020-05-12 DIAGNOSIS — R1312 Dysphagia, oropharyngeal phase: Secondary | ICD-10-CM | POA: Diagnosis not present

## 2020-05-12 DIAGNOSIS — R2689 Other abnormalities of gait and mobility: Secondary | ICD-10-CM | POA: Diagnosis not present

## 2020-05-12 DIAGNOSIS — M6258 Muscle wasting and atrophy, not elsewhere classified, other site: Secondary | ICD-10-CM | POA: Diagnosis not present

## 2020-05-12 DIAGNOSIS — M6281 Muscle weakness (generalized): Secondary | ICD-10-CM | POA: Diagnosis not present

## 2020-05-12 DIAGNOSIS — E512 Wernicke's encephalopathy: Secondary | ICD-10-CM | POA: Diagnosis not present

## 2020-05-12 DIAGNOSIS — R2681 Unsteadiness on feet: Secondary | ICD-10-CM | POA: Diagnosis not present

## 2020-05-12 DIAGNOSIS — Z9181 History of falling: Secondary | ICD-10-CM | POA: Diagnosis not present

## 2020-05-12 DIAGNOSIS — R41841 Cognitive communication deficit: Secondary | ICD-10-CM | POA: Diagnosis not present

## 2020-05-13 DIAGNOSIS — Z9181 History of falling: Secondary | ICD-10-CM | POA: Diagnosis not present

## 2020-05-13 DIAGNOSIS — F1011 Alcohol abuse, in remission: Secondary | ICD-10-CM | POA: Diagnosis not present

## 2020-05-13 DIAGNOSIS — F329 Major depressive disorder, single episode, unspecified: Secondary | ICD-10-CM | POA: Diagnosis not present

## 2020-05-13 DIAGNOSIS — M6258 Muscle wasting and atrophy, not elsewhere classified, other site: Secondary | ICD-10-CM | POA: Diagnosis not present

## 2020-05-13 DIAGNOSIS — R41841 Cognitive communication deficit: Secondary | ICD-10-CM | POA: Diagnosis not present

## 2020-05-13 DIAGNOSIS — R2689 Other abnormalities of gait and mobility: Secondary | ICD-10-CM | POA: Diagnosis not present

## 2020-05-13 DIAGNOSIS — Z8669 Personal history of other diseases of the nervous system and sense organs: Secondary | ICD-10-CM | POA: Diagnosis not present

## 2020-05-13 DIAGNOSIS — F4310 Post-traumatic stress disorder, unspecified: Secondary | ICD-10-CM | POA: Diagnosis not present

## 2020-05-13 DIAGNOSIS — R2681 Unsteadiness on feet: Secondary | ICD-10-CM | POA: Diagnosis not present

## 2020-05-13 DIAGNOSIS — R1312 Dysphagia, oropharyngeal phase: Secondary | ICD-10-CM | POA: Diagnosis not present

## 2020-05-13 DIAGNOSIS — M6281 Muscle weakness (generalized): Secondary | ICD-10-CM | POA: Diagnosis not present

## 2020-05-13 DIAGNOSIS — E512 Wernicke's encephalopathy: Secondary | ICD-10-CM | POA: Diagnosis not present

## 2020-05-14 DIAGNOSIS — R2681 Unsteadiness on feet: Secondary | ICD-10-CM | POA: Diagnosis not present

## 2020-05-14 DIAGNOSIS — Z9181 History of falling: Secondary | ICD-10-CM | POA: Diagnosis not present

## 2020-05-14 DIAGNOSIS — R41841 Cognitive communication deficit: Secondary | ICD-10-CM | POA: Diagnosis not present

## 2020-05-14 DIAGNOSIS — M6281 Muscle weakness (generalized): Secondary | ICD-10-CM | POA: Diagnosis not present

## 2020-05-14 DIAGNOSIS — R2689 Other abnormalities of gait and mobility: Secondary | ICD-10-CM | POA: Diagnosis not present

## 2020-05-14 DIAGNOSIS — R1312 Dysphagia, oropharyngeal phase: Secondary | ICD-10-CM | POA: Diagnosis not present

## 2020-05-14 DIAGNOSIS — M6258 Muscle wasting and atrophy, not elsewhere classified, other site: Secondary | ICD-10-CM | POA: Diagnosis not present

## 2020-05-14 DIAGNOSIS — E512 Wernicke's encephalopathy: Secondary | ICD-10-CM | POA: Diagnosis not present

## 2020-05-15 DIAGNOSIS — R1312 Dysphagia, oropharyngeal phase: Secondary | ICD-10-CM | POA: Diagnosis not present

## 2020-05-15 DIAGNOSIS — R2681 Unsteadiness on feet: Secondary | ICD-10-CM | POA: Diagnosis not present

## 2020-05-15 DIAGNOSIS — R41841 Cognitive communication deficit: Secondary | ICD-10-CM | POA: Diagnosis not present

## 2020-05-15 DIAGNOSIS — E512 Wernicke's encephalopathy: Secondary | ICD-10-CM | POA: Diagnosis not present

## 2020-05-15 DIAGNOSIS — M6281 Muscle weakness (generalized): Secondary | ICD-10-CM | POA: Diagnosis not present

## 2020-05-15 DIAGNOSIS — R2689 Other abnormalities of gait and mobility: Secondary | ICD-10-CM | POA: Diagnosis not present

## 2020-05-15 DIAGNOSIS — Z9181 History of falling: Secondary | ICD-10-CM | POA: Diagnosis not present

## 2020-05-15 DIAGNOSIS — M6258 Muscle wasting and atrophy, not elsewhere classified, other site: Secondary | ICD-10-CM | POA: Diagnosis not present

## 2020-05-16 DIAGNOSIS — R2681 Unsteadiness on feet: Secondary | ICD-10-CM | POA: Diagnosis not present

## 2020-05-16 DIAGNOSIS — R2689 Other abnormalities of gait and mobility: Secondary | ICD-10-CM | POA: Diagnosis not present

## 2020-05-16 DIAGNOSIS — M6258 Muscle wasting and atrophy, not elsewhere classified, other site: Secondary | ICD-10-CM | POA: Diagnosis not present

## 2020-05-16 DIAGNOSIS — R41841 Cognitive communication deficit: Secondary | ICD-10-CM | POA: Diagnosis not present

## 2020-05-16 DIAGNOSIS — Z9181 History of falling: Secondary | ICD-10-CM | POA: Diagnosis not present

## 2020-05-16 DIAGNOSIS — R1312 Dysphagia, oropharyngeal phase: Secondary | ICD-10-CM | POA: Diagnosis not present

## 2020-05-16 DIAGNOSIS — E512 Wernicke's encephalopathy: Secondary | ICD-10-CM | POA: Diagnosis not present

## 2020-05-16 DIAGNOSIS — M6281 Muscle weakness (generalized): Secondary | ICD-10-CM | POA: Diagnosis not present

## 2020-05-17 DIAGNOSIS — R2689 Other abnormalities of gait and mobility: Secondary | ICD-10-CM | POA: Diagnosis not present

## 2020-05-17 DIAGNOSIS — R1312 Dysphagia, oropharyngeal phase: Secondary | ICD-10-CM | POA: Diagnosis not present

## 2020-05-17 DIAGNOSIS — R2681 Unsteadiness on feet: Secondary | ICD-10-CM | POA: Diagnosis not present

## 2020-05-17 DIAGNOSIS — Z9181 History of falling: Secondary | ICD-10-CM | POA: Diagnosis not present

## 2020-05-17 DIAGNOSIS — E512 Wernicke's encephalopathy: Secondary | ICD-10-CM | POA: Diagnosis not present

## 2020-05-17 DIAGNOSIS — M6281 Muscle weakness (generalized): Secondary | ICD-10-CM | POA: Diagnosis not present

## 2020-05-17 DIAGNOSIS — M6258 Muscle wasting and atrophy, not elsewhere classified, other site: Secondary | ICD-10-CM | POA: Diagnosis not present

## 2020-05-17 DIAGNOSIS — R41841 Cognitive communication deficit: Secondary | ICD-10-CM | POA: Diagnosis not present

## 2020-05-18 DIAGNOSIS — R1312 Dysphagia, oropharyngeal phase: Secondary | ICD-10-CM | POA: Diagnosis not present

## 2020-05-18 DIAGNOSIS — R41841 Cognitive communication deficit: Secondary | ICD-10-CM | POA: Diagnosis not present

## 2020-05-18 DIAGNOSIS — M6258 Muscle wasting and atrophy, not elsewhere classified, other site: Secondary | ICD-10-CM | POA: Diagnosis not present

## 2020-05-18 DIAGNOSIS — R2681 Unsteadiness on feet: Secondary | ICD-10-CM | POA: Diagnosis not present

## 2020-05-18 DIAGNOSIS — Z9181 History of falling: Secondary | ICD-10-CM | POA: Diagnosis not present

## 2020-05-18 DIAGNOSIS — R2689 Other abnormalities of gait and mobility: Secondary | ICD-10-CM | POA: Diagnosis not present

## 2020-05-18 DIAGNOSIS — E512 Wernicke's encephalopathy: Secondary | ICD-10-CM | POA: Diagnosis not present

## 2020-05-18 DIAGNOSIS — M6281 Muscle weakness (generalized): Secondary | ICD-10-CM | POA: Diagnosis not present

## 2020-05-19 DIAGNOSIS — R2689 Other abnormalities of gait and mobility: Secondary | ICD-10-CM | POA: Diagnosis not present

## 2020-05-19 DIAGNOSIS — Z9181 History of falling: Secondary | ICD-10-CM | POA: Diagnosis not present

## 2020-05-19 DIAGNOSIS — M6281 Muscle weakness (generalized): Secondary | ICD-10-CM | POA: Diagnosis not present

## 2020-05-19 DIAGNOSIS — R2681 Unsteadiness on feet: Secondary | ICD-10-CM | POA: Diagnosis not present

## 2020-05-19 DIAGNOSIS — M6258 Muscle wasting and atrophy, not elsewhere classified, other site: Secondary | ICD-10-CM | POA: Diagnosis not present

## 2020-05-19 DIAGNOSIS — R41841 Cognitive communication deficit: Secondary | ICD-10-CM | POA: Diagnosis not present

## 2020-05-19 DIAGNOSIS — R1312 Dysphagia, oropharyngeal phase: Secondary | ICD-10-CM | POA: Diagnosis not present

## 2020-05-19 DIAGNOSIS — E512 Wernicke's encephalopathy: Secondary | ICD-10-CM | POA: Diagnosis not present

## 2020-05-20 DIAGNOSIS — M6281 Muscle weakness (generalized): Secondary | ICD-10-CM | POA: Diagnosis not present

## 2020-05-20 DIAGNOSIS — M6258 Muscle wasting and atrophy, not elsewhere classified, other site: Secondary | ICD-10-CM | POA: Diagnosis not present

## 2020-05-20 DIAGNOSIS — R1312 Dysphagia, oropharyngeal phase: Secondary | ICD-10-CM | POA: Diagnosis not present

## 2020-05-20 DIAGNOSIS — R2689 Other abnormalities of gait and mobility: Secondary | ICD-10-CM | POA: Diagnosis not present

## 2020-05-20 DIAGNOSIS — Z9181 History of falling: Secondary | ICD-10-CM | POA: Diagnosis not present

## 2020-05-20 DIAGNOSIS — R2681 Unsteadiness on feet: Secondary | ICD-10-CM | POA: Diagnosis not present

## 2020-05-20 DIAGNOSIS — E512 Wernicke's encephalopathy: Secondary | ICD-10-CM | POA: Diagnosis not present

## 2020-05-20 DIAGNOSIS — R41841 Cognitive communication deficit: Secondary | ICD-10-CM | POA: Diagnosis not present

## 2020-05-21 DIAGNOSIS — Z9181 History of falling: Secondary | ICD-10-CM | POA: Diagnosis not present

## 2020-05-21 DIAGNOSIS — E512 Wernicke's encephalopathy: Secondary | ICD-10-CM | POA: Diagnosis not present

## 2020-05-21 DIAGNOSIS — M6258 Muscle wasting and atrophy, not elsewhere classified, other site: Secondary | ICD-10-CM | POA: Diagnosis not present

## 2020-05-21 DIAGNOSIS — R1312 Dysphagia, oropharyngeal phase: Secondary | ICD-10-CM | POA: Diagnosis not present

## 2020-05-21 DIAGNOSIS — R41841 Cognitive communication deficit: Secondary | ICD-10-CM | POA: Diagnosis not present

## 2020-05-21 DIAGNOSIS — R2681 Unsteadiness on feet: Secondary | ICD-10-CM | POA: Diagnosis not present

## 2020-05-21 DIAGNOSIS — M6281 Muscle weakness (generalized): Secondary | ICD-10-CM | POA: Diagnosis not present

## 2020-05-21 DIAGNOSIS — R2689 Other abnormalities of gait and mobility: Secondary | ICD-10-CM | POA: Diagnosis not present

## 2020-05-22 DIAGNOSIS — R2681 Unsteadiness on feet: Secondary | ICD-10-CM | POA: Diagnosis not present

## 2020-05-22 DIAGNOSIS — R2689 Other abnormalities of gait and mobility: Secondary | ICD-10-CM | POA: Diagnosis not present

## 2020-05-22 DIAGNOSIS — M6258 Muscle wasting and atrophy, not elsewhere classified, other site: Secondary | ICD-10-CM | POA: Diagnosis not present

## 2020-05-22 DIAGNOSIS — Z9181 History of falling: Secondary | ICD-10-CM | POA: Diagnosis not present

## 2020-05-22 DIAGNOSIS — M6281 Muscle weakness (generalized): Secondary | ICD-10-CM | POA: Diagnosis not present

## 2020-05-22 DIAGNOSIS — E512 Wernicke's encephalopathy: Secondary | ICD-10-CM | POA: Diagnosis not present

## 2020-05-22 DIAGNOSIS — R1312 Dysphagia, oropharyngeal phase: Secondary | ICD-10-CM | POA: Diagnosis not present

## 2020-05-22 DIAGNOSIS — R41841 Cognitive communication deficit: Secondary | ICD-10-CM | POA: Diagnosis not present

## 2020-05-23 DIAGNOSIS — R41841 Cognitive communication deficit: Secondary | ICD-10-CM | POA: Diagnosis not present

## 2020-05-23 DIAGNOSIS — R2681 Unsteadiness on feet: Secondary | ICD-10-CM | POA: Diagnosis not present

## 2020-05-23 DIAGNOSIS — M6281 Muscle weakness (generalized): Secondary | ICD-10-CM | POA: Diagnosis not present

## 2020-05-23 DIAGNOSIS — E512 Wernicke's encephalopathy: Secondary | ICD-10-CM | POA: Diagnosis not present

## 2020-05-23 DIAGNOSIS — Z9181 History of falling: Secondary | ICD-10-CM | POA: Diagnosis not present

## 2020-05-23 DIAGNOSIS — R1312 Dysphagia, oropharyngeal phase: Secondary | ICD-10-CM | POA: Diagnosis not present

## 2020-05-23 DIAGNOSIS — R2689 Other abnormalities of gait and mobility: Secondary | ICD-10-CM | POA: Diagnosis not present

## 2020-05-23 DIAGNOSIS — M6258 Muscle wasting and atrophy, not elsewhere classified, other site: Secondary | ICD-10-CM | POA: Diagnosis not present

## 2020-05-24 DIAGNOSIS — R2689 Other abnormalities of gait and mobility: Secondary | ICD-10-CM | POA: Diagnosis not present

## 2020-05-24 DIAGNOSIS — M6281 Muscle weakness (generalized): Secondary | ICD-10-CM | POA: Diagnosis not present

## 2020-05-24 DIAGNOSIS — R2681 Unsteadiness on feet: Secondary | ICD-10-CM | POA: Diagnosis not present

## 2020-05-24 DIAGNOSIS — M6258 Muscle wasting and atrophy, not elsewhere classified, other site: Secondary | ICD-10-CM | POA: Diagnosis not present

## 2020-05-24 DIAGNOSIS — R1312 Dysphagia, oropharyngeal phase: Secondary | ICD-10-CM | POA: Diagnosis not present

## 2020-05-24 DIAGNOSIS — R41841 Cognitive communication deficit: Secondary | ICD-10-CM | POA: Diagnosis not present

## 2020-05-24 DIAGNOSIS — E512 Wernicke's encephalopathy: Secondary | ICD-10-CM | POA: Diagnosis not present

## 2020-05-24 DIAGNOSIS — Z9181 History of falling: Secondary | ICD-10-CM | POA: Diagnosis not present

## 2020-05-25 LAB — CUP PACEART REMOTE DEVICE CHECK
Date Time Interrogation Session: 20210819001017
Implantable Pulse Generator Implant Date: 20200928

## 2020-05-26 ENCOUNTER — Ambulatory Visit (INDEPENDENT_AMBULATORY_CARE_PROVIDER_SITE_OTHER): Payer: Medicare Other | Admitting: *Deleted

## 2020-05-26 DIAGNOSIS — E512 Wernicke's encephalopathy: Secondary | ICD-10-CM | POA: Diagnosis not present

## 2020-05-26 DIAGNOSIS — R1312 Dysphagia, oropharyngeal phase: Secondary | ICD-10-CM | POA: Diagnosis not present

## 2020-05-26 DIAGNOSIS — I4891 Unspecified atrial fibrillation: Secondary | ICD-10-CM

## 2020-05-26 DIAGNOSIS — Z9181 History of falling: Secondary | ICD-10-CM | POA: Diagnosis not present

## 2020-05-26 DIAGNOSIS — R41841 Cognitive communication deficit: Secondary | ICD-10-CM | POA: Diagnosis not present

## 2020-05-26 DIAGNOSIS — M6258 Muscle wasting and atrophy, not elsewhere classified, other site: Secondary | ICD-10-CM | POA: Diagnosis not present

## 2020-05-26 DIAGNOSIS — R2689 Other abnormalities of gait and mobility: Secondary | ICD-10-CM | POA: Diagnosis not present

## 2020-05-26 DIAGNOSIS — M6281 Muscle weakness (generalized): Secondary | ICD-10-CM | POA: Diagnosis not present

## 2020-05-26 DIAGNOSIS — R2681 Unsteadiness on feet: Secondary | ICD-10-CM | POA: Diagnosis not present

## 2020-05-26 NOTE — Progress Notes (Signed)
[facility] Summary Report / Loop Recorder 

## 2020-05-27 DIAGNOSIS — E512 Wernicke's encephalopathy: Secondary | ICD-10-CM | POA: Diagnosis not present

## 2020-05-27 DIAGNOSIS — Z8669 Personal history of other diseases of the nervous system and sense organs: Secondary | ICD-10-CM | POA: Diagnosis not present

## 2020-05-27 DIAGNOSIS — M6281 Muscle weakness (generalized): Secondary | ICD-10-CM | POA: Diagnosis not present

## 2020-05-27 DIAGNOSIS — R1312 Dysphagia, oropharyngeal phase: Secondary | ICD-10-CM | POA: Diagnosis not present

## 2020-05-27 DIAGNOSIS — F1011 Alcohol abuse, in remission: Secondary | ICD-10-CM | POA: Diagnosis not present

## 2020-05-27 DIAGNOSIS — E118 Type 2 diabetes mellitus with unspecified complications: Secondary | ICD-10-CM | POA: Diagnosis not present

## 2020-05-27 DIAGNOSIS — F4310 Post-traumatic stress disorder, unspecified: Secondary | ICD-10-CM | POA: Diagnosis not present

## 2020-05-27 DIAGNOSIS — F1027 Alcohol dependence with alcohol-induced persisting dementia: Secondary | ICD-10-CM | POA: Diagnosis not present

## 2020-05-27 DIAGNOSIS — R2689 Other abnormalities of gait and mobility: Secondary | ICD-10-CM | POA: Diagnosis not present

## 2020-05-27 DIAGNOSIS — R2681 Unsteadiness on feet: Secondary | ICD-10-CM | POA: Diagnosis not present

## 2020-05-27 DIAGNOSIS — E878 Other disorders of electrolyte and fluid balance, not elsewhere classified: Secondary | ICD-10-CM | POA: Diagnosis not present

## 2020-05-27 DIAGNOSIS — Z9181 History of falling: Secondary | ICD-10-CM | POA: Diagnosis not present

## 2020-05-27 DIAGNOSIS — F329 Major depressive disorder, single episode, unspecified: Secondary | ICD-10-CM | POA: Diagnosis not present

## 2020-05-27 DIAGNOSIS — I10 Essential (primary) hypertension: Secondary | ICD-10-CM | POA: Diagnosis not present

## 2020-05-27 DIAGNOSIS — M6258 Muscle wasting and atrophy, not elsewhere classified, other site: Secondary | ICD-10-CM | POA: Diagnosis not present

## 2020-05-27 DIAGNOSIS — R41841 Cognitive communication deficit: Secondary | ICD-10-CM | POA: Diagnosis not present

## 2020-05-28 DIAGNOSIS — R2689 Other abnormalities of gait and mobility: Secondary | ICD-10-CM | POA: Diagnosis not present

## 2020-05-28 DIAGNOSIS — E1110 Type 2 diabetes mellitus with ketoacidosis without coma: Secondary | ICD-10-CM | POA: Diagnosis not present

## 2020-05-28 DIAGNOSIS — R41841 Cognitive communication deficit: Secondary | ICD-10-CM | POA: Diagnosis not present

## 2020-05-28 DIAGNOSIS — I4891 Unspecified atrial fibrillation: Secondary | ICD-10-CM | POA: Diagnosis not present

## 2020-05-28 DIAGNOSIS — R1312 Dysphagia, oropharyngeal phase: Secondary | ICD-10-CM | POA: Diagnosis not present

## 2020-05-28 DIAGNOSIS — M6281 Muscle weakness (generalized): Secondary | ICD-10-CM | POA: Diagnosis not present

## 2020-05-28 DIAGNOSIS — R2681 Unsteadiness on feet: Secondary | ICD-10-CM | POA: Diagnosis not present

## 2020-05-28 DIAGNOSIS — E512 Wernicke's encephalopathy: Secondary | ICD-10-CM | POA: Diagnosis not present

## 2020-05-28 DIAGNOSIS — Z9181 History of falling: Secondary | ICD-10-CM | POA: Diagnosis not present

## 2020-05-28 DIAGNOSIS — M6258 Muscle wasting and atrophy, not elsewhere classified, other site: Secondary | ICD-10-CM | POA: Diagnosis not present

## 2020-06-03 DIAGNOSIS — Z8669 Personal history of other diseases of the nervous system and sense organs: Secondary | ICD-10-CM | POA: Diagnosis not present

## 2020-06-03 DIAGNOSIS — F1011 Alcohol abuse, in remission: Secondary | ICD-10-CM | POA: Diagnosis not present

## 2020-06-03 DIAGNOSIS — F329 Major depressive disorder, single episode, unspecified: Secondary | ICD-10-CM | POA: Diagnosis not present

## 2020-06-03 DIAGNOSIS — I10 Essential (primary) hypertension: Secondary | ICD-10-CM | POA: Diagnosis not present

## 2020-06-03 DIAGNOSIS — F4310 Post-traumatic stress disorder, unspecified: Secondary | ICD-10-CM | POA: Diagnosis not present

## 2020-06-03 DIAGNOSIS — T50901A Poisoning by unspecified drugs, medicaments and biological substances, accidental (unintentional), initial encounter: Secondary | ICD-10-CM | POA: Diagnosis not present

## 2020-06-05 ENCOUNTER — Other Ambulatory Visit: Payer: Self-pay

## 2020-06-05 ENCOUNTER — Ambulatory Visit: Payer: Medicare Other | Admitting: Internal Medicine

## 2020-06-05 VITALS — BP 140/82 | HR 92 | Ht 70.0 in | Wt 205.0 lb

## 2020-06-05 DIAGNOSIS — G4733 Obstructive sleep apnea (adult) (pediatric): Secondary | ICD-10-CM

## 2020-06-05 DIAGNOSIS — I10 Essential (primary) hypertension: Secondary | ICD-10-CM

## 2020-06-05 DIAGNOSIS — I4819 Other persistent atrial fibrillation: Secondary | ICD-10-CM | POA: Diagnosis not present

## 2020-06-05 LAB — CUP PACEART INCLINIC DEVICE CHECK
Date Time Interrogation Session: 20210902152644
Implantable Pulse Generator Implant Date: 20200928

## 2020-06-05 NOTE — Progress Notes (Signed)
PCP: [name], MD   Primary EP: Adam [name] is a 69 y.o. male who presents today for routine electrophysiology followup.  Since last being seen in our clinic, the patient reports doing poorly.  He was hospitalized with fall (likely secondary to head injury) in May.  He has been in assisted living/ rehab.  His afib has improved since being in rehab.  Today, he denies symptoms of palpitations, chest pain, shortness of breath,  lower extremity edema, dizziness, presyncope, or syncope.  The patient is otherwise without complaint today.   Past Medical History:  Diagnosis Date  . Allergy   . Anxiety    history of PTSD following CABG  . Ascending aortic aneurysm ([place]) 01/31/2018   43 x 42 mm, pt unaware  . Asthma   . Cardiomegaly 10/17/2017  . Colitis- colonoscopy 2014 07/13/2015  . COPD (chronic obstructive pulmonary disease) ([place])   . Coronary artery disease    x 6  . Depression   . Diabetes mellitus without complication ([place])   . Family history of polyps in the colon   . Finger dislocation    Left pinkie  . GERD (gastroesophageal reflux disease)   . Gout   . H/O atrial fibrillation without current medication    following CABG with no documented episodes since then.  . Heart palpitations   . Hx of adenomatous colonic polyps 08/12/2010  . Hyperlipidemia   . Hypertension   . OA (osteoarthritis)   . OSA (obstructive sleep apnea)    Mild, has not received CPAP yet  . Prediabetes   . RLS (restless legs syndrome)   . 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 ANKLE FRACTURE SURGERY Right 1991  . APPENDECTOMY    . ATRIAL FIBRILLATION ABLATION N/A 03/31/2018   Procedure: ATRIAL FIBRILLATION ABLATION;  Surgeon: Adam Grayer, MD;  Location: [facility];  Service: Cardiovascular;  Laterality: N/A;  . CARDIAC ELECTROPHYSIOLOGY MAPPING AND ABLATION    . COLONOSCOPY W/ BIOPSIES  2017   x7  .  CORONARY ANGIOPLASTY WITH STENT PLACEMENT    . CORONARY ARTERY BYPASS GRAFT    . FINGER SURGERY  04/2018   Small finger left hand  . implantable loop recorder placement  07/02/2019   Medtronic Reveal [place] model [id number] ([place] [id number]) implanted in office by Adam Rayann Heman  . TEE WITHOUT CARDIOVERSION N/A 03/30/2018   Procedure: TRANSESOPHAGEAL ECHOCARDIOGRAM (TEE);  Surgeon: [name], MD;  Location: [facility];  Service: Cardiovascular;  Laterality: N/A;  . TOTAL HIP ARTHROPLASTY Left   . TOTAL HIP ARTHROPLASTY Right 09/12/2018   Procedure: TOTAL HIP ARTHROPLASTY ANTERIOR APPROACH;  Surgeon: [name], MD;  Location: [facility];  Service: Orthopedics;  Laterality: Right;  70 mins    ROS- all systems are reviewed and negatives except as per HPI above  Current Outpatient Medications  Medication Sig Dispense Refill  . allopurinol (ZYLOPRIM) 300 MG tablet Take 1 tablet (300 mg total) by mouth daily. 90 tablet 3  . apixaban (ELIQUIS) 5 MG TABS tablet Take 1 tablet (5 mg total) by mouth 2 (two) times daily. 60 tablet 1  . diltiazem (CARTIA XT) 300 MG 24 hr capsule Take 1 capsule (300 mg total) by mouth daily. 30 capsule 3  . dofetilide (TIKOSYN) 125 MCG capsule Take 1 capsule (125 mcg total) by mouth 2 (two) times daily. May have 90 day refills if patient  prefers 60 capsule 11  . DULoxetine (CYMBALTA) 60 MG capsule Take 1 capsule (60 mg total) by mouth daily. 30 capsule 3  . feeding supplement, ENSURE ENLIVE, (ENSURE ENLIVE) LIQD Take 237 mLs by mouth daily. 237 mL 12  . fluticasone (FLONASE) 50 MCG/ACT nasal spray Place 1 spray into both nostrils daily as needed for allergies or rhinitis.     . Fluticasone-Umeclidin-Vilant (TRELEGY ELLIPTA) 100-62.5-25 MCG/INH AEPB Inhale 1 puff into the lungs daily. 60 each 3  . gabapentin (NEURONTIN) 300 MG capsule Take 1 capsule (300 mg total) by mouth at bedtime. Take at night as needed for restless leg symptoms 1 capsule 0  . glucose blood (ONETOUCH VERIO)  test strip Test once daily.  Dx E11.9 100 each 3  . hydrocortisone (ANUSOL-HC) 2.5 % rectal cream Use nightly at bedtime for 10 days 30 g 1  . insulin glargine (LANTUS SOLOSTAR) 100 UNIT/ML Solostar Pen Inject 18 Units into the skin daily. 15 mL 11  . JANUVIA 100 MG tablet TAKE 1 TABLET(100 MG) BY MOUTH DAILY 90 tablet 1  . montelukast (SINGULAIR) 10 MG tablet Take 10 mg by mouth at bedtime.    . Multiple Vitamin (MULTIVITAMIN WITH MINERALS) TABS tablet Take 1 tablet by mouth daily.    . potassium chloride SA (KLOR-CON) 20 MEQ tablet Take 1 tablet (20 mEq total) by mouth daily.    . rosuvastatin (CRESTOR) 40 MG tablet Take 1 tablet (40 mg total) by mouth daily. 90 tablet 3  . thiamine 100 MG tablet Take 100 mg by mouth daily.    Marland Kitchen VASCEPA 1 g capsule TAKE 2 CAPSULES(2 GRAMS) BY MOUTH TWICE DAILY 180 capsule 1  . losartan (COZAAR) 100 MG tablet Take 1 tablet (100 mg total) by mouth daily. 90 tablet 0  . traMADol (ULTRAM) 50 MG tablet Take 1 tablet by mouth as needed for pain.     No current facility-administered medications for this visit.    Physical Exam: Vitals:   06/05/20 1527  BP: 140/82  Pulse: 92  SpO2: 98%  Weight: 205 lb (93 kg)  Height: 5\' 10"  (1.778 m)     GEN- The patient is well appearing, alert and confused,  In a wheelchair today Head- normocephalic, atraumatic Eyes-  Sclera clear, conjunctiva pink Ears- hearing intact Oropharynx- clear Lungs-   normal work of breathing Heart- Regular rate and rhythm  GI- soft  Extremities- no clubbing, cyanosis, or edema  ekg today reveals sinus rhythm, first degree AV block, Qtc 487 msec  Wt Readings from Last 3 Encounters:  06/05/20 205 lb (93 kg)  03/03/20 217 lb 11.2 oz (98.7 kg)  02/18/20 237 lb (107.5 kg)    Assessment and Plan:  1. Paroxysmal atrial fibrillation afib burden is 43%, though much better since August chads2vasc score is 4.  He is on eliquis He remains on [employer], mg and cbc today Stop  ASA Needs close follow-up on tikosyn to avoid toxicity  2. Obesity Lifestyle modification is advised  3. OSA He has not been compliant with therapy  4. ETOH Avoidance advised.  Risks, benefits and potential toxicities for medications prescribed and/or refilled reviewed with patient today.   He seems to have poor insight into his disease.  His wife is with him today and is worried about ETOH in the future.  Return to AF clinic in 3 months I will see in 6 months  Adam Grayer MD, [facility] 06/05/2020 3:49 PM

## 2020-06-05 NOTE — Patient Instructions (Addendum)
Medication Instructions:  Your physician has recommended you make the following change in your medication:   1.  STOP taking aspirin  Labwork: You will get lab work today:  BMP, CBC, magnesium  Testing/Procedures: None ordered.  Follow-Up:  Your physician wants you to follow-up in: 3 months with the AFIB clinic.   You will receive a reminder letter in the mail two months in advance. If you don't receive a letter, please call our office to schedule the follow-up appointment.  Your physician wants you to follow-up in: 6 months with Dr. Rayann Heman.   December 08, 2020 at 2:00 PM with Dr. Rayann Heman at the [facility] office.    Any Other Special Instructions Will Be Listed Below (If Applicable).  If you need a refill on your cardiac medications before your next appointment, please call your pharmacy.

## 2020-06-06 LAB — CBC WITH DIFFERENTIAL/PLATELET
Basophils Absolute: 0.1 10*3/uL (ref 0.0–0.2)
Basos: 1 %
EOS (ABSOLUTE): 0.3 10*3/uL (ref 0.0–0.4)
Eos: 3 %
Hematocrit: 32 % — ABNORMAL LOW (ref 37.5–51.0)
Hemoglobin: 10.5 g/dL — ABNORMAL LOW (ref 13.0–17.7)
Immature Grans (Abs): 0.1 10*3/uL (ref 0.0–0.1)
Immature Granulocytes: 1 %
Lymphocytes Absolute: 1.7 10*3/uL (ref 0.7–3.1)
Lymphs: 22 %
MCH: 31.7 pg (ref 26.6–33.0)
MCHC: 32.8 g/dL (ref 31.5–35.7)
MCV: 97 fL (ref 79–97)
Monocytes Absolute: 1.2 10*3/uL — ABNORMAL HIGH (ref 0.1–0.9)
Monocytes: 16 %
Neutrophils Absolute: 4.4 10*3/uL (ref 1.4–7.0)
Neutrophils: 57 %
Platelets: 203 10*3/uL (ref 150–450)
RBC: 3.31 x10E6/uL — ABNORMAL LOW (ref 4.14–5.80)
RDW: 13.6 % (ref 11.6–15.4)
WBC: 7.6 10*3/uL (ref 3.4–10.8)

## 2020-06-06 LAB — BASIC METABOLIC PANEL
BUN/Creatinine Ratio: 11 (ref 10–24)
BUN: 9 mg/dL (ref 8–27)
CO2: 24 mmol/L (ref 20–29)
Calcium: 9.4 mg/dL (ref 8.6–10.2)
Chloride: 101 mmol/L (ref 96–106)
Creatinine, Ser: 0.82 mg/dL (ref 0.76–1.27)
GFR calc Af Amer: 104 mL/min/{1.73_m2} (ref >59)
GFR calc non Af Amer: 90 mL/min/{1.73_m2} (ref >59)
Glucose: 267 mg/dL — ABNORMAL HIGH (ref 65–99)
Potassium: 4 mmol/L (ref 3.5–5.2)
Sodium: 137 mmol/L (ref 134–144)

## 2020-06-06 LAB — MAGNESIUM: Magnesium: 1.8 mg/dL (ref 1.6–2.3)

## 2020-06-09 DIAGNOSIS — F4310 Post-traumatic stress disorder, unspecified: Secondary | ICD-10-CM | POA: Diagnosis not present

## 2020-06-09 DIAGNOSIS — Z8669 Personal history of other diseases of the nervous system and sense organs: Secondary | ICD-10-CM | POA: Diagnosis not present

## 2020-06-09 DIAGNOSIS — E1110 Type 2 diabetes mellitus with ketoacidosis without coma: Secondary | ICD-10-CM | POA: Diagnosis not present

## 2020-06-09 DIAGNOSIS — F331 Major depressive disorder, recurrent, moderate: Secondary | ICD-10-CM | POA: Diagnosis not present

## 2020-06-09 DIAGNOSIS — G4700 Insomnia, unspecified: Secondary | ICD-10-CM | POA: Diagnosis not present

## 2020-06-10 DIAGNOSIS — F1027 Alcohol dependence with alcohol-induced persisting dementia: Secondary | ICD-10-CM | POA: Diagnosis not present

## 2020-06-10 DIAGNOSIS — J069 Acute upper respiratory infection, unspecified: Secondary | ICD-10-CM | POA: Diagnosis not present

## 2020-06-10 DIAGNOSIS — R05 Cough: Secondary | ICD-10-CM | POA: Diagnosis not present

## 2020-06-10 DIAGNOSIS — R4189 Other symptoms and signs involving cognitive functions and awareness: Secondary | ICD-10-CM | POA: Diagnosis not present

## 2020-06-10 DIAGNOSIS — R2681 Unsteadiness on feet: Secondary | ICD-10-CM | POA: Diagnosis not present

## 2020-06-16 NOTE — Progress Notes (Signed)
Cardiology Office Note   Date:  06/18/2020   ID:  DENNIE MOLTZ, DOB [birth year], MRN [id number]  PCP:  [name], MD  Cardiologist:   Adam Kallam [place], MD   Chief Complaint  Patient presents with  . Coronary Artery Disease  . Atrial Fibrillation      History of Present Illness: Adam [name] is a 69 y.o. male who presents for follow up CAD. He is a former patient of [name]. He is followed by Dr. Rayann Heman in the EP clinic. He has a past medical history which includesCABG in 2011 done in [place], HTN, HLD, DM, Atrial fibrillation, and OSA- followed by Dr Radford Pax. He underwent catheter ablation for atrial fibrillation by Dr. Rayann Heman 03/31/18.He is onTikosyn and Xarelto.Event monitor in June 2020 showed no Afib. Because of intermittent palpitations Dr Rayann Heman placed an ILR in September. To date Afib burden is 0.5-0.9%. He is followed by Dr. Radford Pax in sleep clinic, but unable to tolerate CPAP. He reports that his sleep apnea is mild.  He had an echo in January of 2019 that showed normal LVEF and no significant valvular abnormalities. Stress test in 2013 showed no ischemia.He had an abnormal chest CT done 01/2018 that showed an ascending aortic aneurysm, measuring 43 x 42 mm. Repeat CT angio in May 2020 showed no change in size.   The patient reports that prior to CABG he had 25 stents placed over a 10-12 year period.  These procedures and his bypass were done in [place], [place]. He notes he previously exercised regularly but over the past 2 years hasn't been able to do much. 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   He has been followed by Dr Rayann Heman. Seen 06/05/20: noted Afib burden of 43% but improved from August. He was admitted in May with acute encephalopathy felt to be due to alcohol withdrawal and Wernicke's encephalopathy.  He was in Afib during this hospitalization initially w/RVR though rate controlled by discharge, discharge summary notes again  discussion regarding a/c and he again adamantly declined. He was later resumed on Tikosyn. He is now at [facility] for [place]. He did get started on Eliquis when last seen by Dr Rayann Heman. He denies any dyspnea. Rare twinge of chest pain. Scheduled for CT aorta this Friday to follow up aneurysm.    Past Medical History:  Diagnosis Date  . Allergy   . Anxiety    history of PTSD following CABG  . Ascending aortic aneurysm ([place]) 01/31/2018   43 x 42 mm, pt unaware  . Asthma   . Cardiomegaly 10/17/2017  . Colitis- colonoscopy 2014 07/13/2015  . COPD (chronic obstructive pulmonary disease) ([place])   . Coronary artery disease    x 6  . Depression   . Diabetes mellitus without complication ([place])   . Family history of polyps in the colon   . Finger dislocation    Left pinkie  . GERD (gastroesophageal reflux disease)   . Gout   . H/O atrial fibrillation without current medication    following CABG with no documented episodes since then.  . Heart palpitations   . Hx of adenomatous colonic polyps 08/12/2010  . Hyperlipidemia   . Hypertension   . OA (osteoarthritis)   . OSA (obstructive sleep apnea)    Mild, has not received CPAP yet  . Prediabetes   . RLS (restless legs syndrome)   . 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 ANKLE FRACTURE SURGERY Right 1991  . APPENDECTOMY    . ATRIAL FIBRILLATION ABLATION N/A 03/31/2018   Procedure: ATRIAL FIBRILLATION ABLATION;  Surgeon: [name], MD;  Location: [facility];  Service: Cardiovascular;  Laterality: N/A;  . CARDIAC ELECTROPHYSIOLOGY MAPPING AND ABLATION    . COLONOSCOPY W/ BIOPSIES  2017   x7  . CORONARY ANGIOPLASTY WITH STENT PLACEMENT    . CORONARY ARTERY BYPASS GRAFT    . FINGER SURGERY  04/2018   Small finger left hand  . implantable loop recorder placement  07/02/2019   Medtronic Reveal [place] model [id number] ([place] [id number]) implanted in office by Dr Rayann Heman  . TEE  WITHOUT CARDIOVERSION N/A 03/30/2018   Procedure: TRANSESOPHAGEAL ECHOCARDIOGRAM (TEE);  Surgeon: [name], MD;  Location: [facility];  Service: Cardiovascular;  Laterality: N/A;  . TOTAL HIP ARTHROPLASTY Left   . TOTAL HIP ARTHROPLASTY Right 09/12/2018   Procedure: TOTAL HIP ARTHROPLASTY ANTERIOR APPROACH;  Surgeon: [name], MD;  Location: [facility];  Service: Orthopedics;  Laterality: Right;  70 mins     Current Outpatient Medications  Medication Sig Dispense Refill  . allopurinol (ZYLOPRIM) 300 MG tablet Take 1 tablet (300 mg total) by mouth daily. 90 tablet 3  . apixaban (ELIQUIS) 5 MG TABS tablet Take 1 tablet (5 mg total) by mouth 2 (two) times daily. 60 tablet 1  . diltiazem (CARTIA XT) 300 MG 24 hr capsule Take 1 capsule (300 mg total) by mouth daily. 30 capsule 3  . dofetilide (TIKOSYN) 125 MCG capsule Take 1 capsule (125 mcg total) by mouth 2 (two) times daily. May have 90 day refills if patient prefers 60 capsule 11  . DULoxetine (CYMBALTA) 60 MG capsule Take 1 capsule (60 mg total) by mouth daily. 30 capsule 3  . feeding supplement, ENSURE ENLIVE, (ENSURE ENLIVE) LIQD Take 237 mLs by mouth daily. 237 mL 12  . fluticasone (FLONASE) 50 MCG/ACT nasal spray Place 1 spray into both nostrils daily as needed for allergies or rhinitis.     . Fluticasone-Umeclidin-Vilant (TRELEGY ELLIPTA) 100-62.5-25 MCG/INH AEPB Inhale 1 puff into the lungs daily. 60 each 3  . gabapentin (NEURONTIN) 300 MG capsule Take 1 capsule (300 mg total) by mouth at bedtime. Take at night as needed for restless leg symptoms 1 capsule 0  . glucose blood (ONETOUCH VERIO) test strip Test once daily.  Dx E11.9 100 each 3  . hydrocortisone (ANUSOL-HC) 2.5 % rectal cream Use nightly at bedtime for 10 days 30 g 1  . insulin glargine (LANTUS SOLOSTAR) 100 UNIT/ML Solostar Pen Inject 18 Units into the skin daily. 15 mL 11  . JANUVIA 100 MG tablet TAKE 1 TABLET(100 MG) BY MOUTH DAILY 90 tablet 1  . montelukast  (SINGULAIR) 10 MG tablet Take 10 mg by mouth at bedtime.    . Multiple Vitamin (MULTIVITAMIN WITH MINERALS) TABS tablet Take 1 tablet by mouth daily.    . potassium chloride SA (KLOR-CON) 20 MEQ tablet Take 1 tablet (20 mEq total) by mouth daily.    . rosuvastatin (CRESTOR) 40 MG tablet Take 1 tablet (40 mg total) by mouth daily. 90 tablet 3  . thiamine 100 MG tablet Take 100 mg by mouth daily.    . traMADol (ULTRAM) 50 MG tablet Take 1 tablet by mouth as needed for pain.    Marland Kitchen VASCEPA 1 g capsule TAKE 2 CAPSULES(2 GRAMS) BY MOUTH TWICE DAILY 180 capsule 1  . losartan (COZAAR)  100 MG tablet Take 1 tablet (100 mg total) by mouth daily. 90 tablet 0   No current facility-administered medications for this visit.    Allergies:   Xarelto [rivaroxaban], Amoxicillin, Augmentin [amoxicillin-pot clavulanate], Azithromycin, Clindamycin/lincomycin, and Keflex [cephalexin]    Social History:  The patient  reports that he quit smoking about 42 years ago. His smoking use included cigarettes. He has a 7.00 pack-year smoking history. He has never used smokeless tobacco. He reports current alcohol use of about 3.0 - 4.0 standard drinks of alcohol per week. He reports that he does not use drugs.   Family History:  The patient's family history includes Cancer in his father; Colon cancer in his mother; Heart disease in his paternal grandfather and paternal grandmother.    ROS:  Please see the history of present illness.   Otherwise, review of systems are positive for none.   All other systems are reviewed and negative.    PHYSICAL EXAM: VS:  BP 125/69   Pulse 96   Temp (!) 96.8 F (36 C)   Ht 5\' 10"  (1.778 m)   Wt 219 lb 9.6 oz (99.6 kg)   SpO2 98%   BMI 31.51 kg/m  , BMI Body mass index is 31.51 kg/m. GEN: Well nourished, obese, in no acute distress  HEENT: normal  Neck: no JVD, carotid bruits, or masses Cardiac: RRR; no murmurs, rubs, or gallops,no edema  Respiratory:  clear to auscultation  bilaterally, normal work of breathing GI: soft, nontender, nondistended, + BS MS: no deformity or atrophy  Skin: warm and dry, no rash Neuro:  Strength and sensation are intact Psych: euthymic mood, full affect   EKG:  EKG is not ordered today. The ekg ordered today demonstrates N/A   Recent Labs: 12/03/2019: Pro B Natriuretic peptide (BNP) 266.0 02/08/2020: TSH 3.137 02/20/2020: ALT 34 06/05/2020: BUN 9; Creatinine, Ser 0.82; Hemoglobin 10.5; Magnesium 1.8; Platelets 203; Potassium 4.0; Sodium 137    Lipid Panel    Component Value Date/Time   CHOL 108 02/21/2020 0334   CHOL 142 08/08/2019 1058   TRIG 163 (H) 02/21/2020 0334   HDL 20 (L) 02/21/2020 0334   HDL 37 (L) 08/08/2019 1058   CHOLHDL 5.4 02/21/2020 0334   VLDL 33 02/21/2020 0334   LDLCALC 55 02/21/2020 0334   LDLCALC 65 08/08/2019 1058   LDLDIRECT 124.0 06/23/2018 1118      Wt Readings from Last 3 Encounters:  06/18/20 219 lb 9.6 oz (99.6 kg)  06/05/20 205 lb (93 kg)  03/03/20 217 lb 11.2 oz (98.7 kg)      Other studies Reviewed: Additional studies/ records that were reviewed today include:  Echo 03/30/18: Study Conclusions  - Left ventricle: The cavity size was normal. Wall thickness was   normal. Systolic function was normal. The estimated ejection   fraction was in the range of 55% to 60%. Wall motion was normal;   there were no regional wall motion abnormalities. - Mitral valve: Mildly calcified annulus. - Left atrium: No evidence of thrombus in the atrial cavity or   appendage. No spontaneous echo contrast was observed. The   appendage was morphologically a left appendage, multilobulated,   and of normal size. Emptying velocity was mildly reduced. - Right atrium: No evidence of thrombus in the atrial cavity or   appendage. - Atrial septum: There was a very small patent foramen ovale.   Doppler showed a trivial left-to-right shunt, in the baseline   state.  Event monitor 03/07/19: Study  Highlights  Sinus rhythm Rare premature ventricular contractions Nonsustained ventricular tachycardia No sustained arrhythmias No atrial fibrillation Baseline artifact limits interpretation at times.      ASSESSMENT AND PLAN:  1. Thoracic aortic aneurysm: Incidental finding on a chest CT done April 2019, measuring at 43 x 42 mm. Repeat scan in May 2020 showed no change.  He is asymptomatic.  Follow up this Friday.  2.  CAD: Status post CABG in 2011 done in [place]. Multiple stents prior to this. He has done well since CABG. No angina.   Last Myoview noted in our records was 2013 and was normal. Continue medical therapy with beta-blocker and statin.  He is not on aspirin due to Eliquis   3.  Paroxysmal atrial fibrillation: Followed by Dr. Rayann Heman  History of A fib ablation in June 2019. Now with ILR in place.  He is currently on Tikosyn, metoprolol and Eliquis  4.  Obstructive sleep apnea: Reports intolerance of CPAP.   5. DM: management per PCP. He is on [place]. Per primary care.   6. HTN. Currently well controlled.  7. Mixed HLD. LDL at goal on statin. Triglycerides elevated. On Vascepa. Continue Rx.   8. Encephalopathy. Probably related to history of Etoh use. Now in Rehab facility.    Disposition:   FU with me in one year.  Signed, Shayonna Ocampo [place], MD  06/18/2020 11:19 AM    [facility] [address] [place], [place], [address] Phone [phone], Fax [phone]

## 2020-06-17 DIAGNOSIS — R41841 Cognitive communication deficit: Secondary | ICD-10-CM | POA: Diagnosis not present

## 2020-06-17 DIAGNOSIS — M6281 Muscle weakness (generalized): Secondary | ICD-10-CM | POA: Diagnosis not present

## 2020-06-17 DIAGNOSIS — E512 Wernicke's encephalopathy: Secondary | ICD-10-CM | POA: Diagnosis not present

## 2020-06-17 DIAGNOSIS — Z9181 History of falling: Secondary | ICD-10-CM | POA: Diagnosis not present

## 2020-06-17 DIAGNOSIS — M6258 Muscle wasting and atrophy, not elsewhere classified, other site: Secondary | ICD-10-CM | POA: Diagnosis not present

## 2020-06-17 DIAGNOSIS — R1312 Dysphagia, oropharyngeal phase: Secondary | ICD-10-CM | POA: Diagnosis not present

## 2020-06-17 DIAGNOSIS — R2681 Unsteadiness on feet: Secondary | ICD-10-CM | POA: Diagnosis not present

## 2020-06-17 DIAGNOSIS — R2689 Other abnormalities of gait and mobility: Secondary | ICD-10-CM | POA: Diagnosis not present

## 2020-06-18 ENCOUNTER — Other Ambulatory Visit: Payer: Self-pay

## 2020-06-18 ENCOUNTER — Encounter: Payer: Self-pay | Admitting: Cardiology

## 2020-06-18 ENCOUNTER — Ambulatory Visit (INDEPENDENT_AMBULATORY_CARE_PROVIDER_SITE_OTHER): Payer: Medicare Other | Admitting: Cardiology

## 2020-06-18 VITALS — BP 125/69 | HR 96 | Temp 96.8°F | Ht 70.0 in | Wt 219.6 lb

## 2020-06-18 DIAGNOSIS — G4733 Obstructive sleep apnea (adult) (pediatric): Secondary | ICD-10-CM

## 2020-06-18 DIAGNOSIS — I480 Paroxysmal atrial fibrillation: Secondary | ICD-10-CM

## 2020-06-18 DIAGNOSIS — I10 Essential (primary) hypertension: Secondary | ICD-10-CM

## 2020-06-18 DIAGNOSIS — I25708 Atherosclerosis of coronary artery bypass graft(s), unspecified, with other forms of angina pectoris: Secondary | ICD-10-CM

## 2020-06-18 DIAGNOSIS — I712 Thoracic aortic aneurysm, without rupture, unspecified: Secondary | ICD-10-CM

## 2020-06-19 DIAGNOSIS — R41841 Cognitive communication deficit: Secondary | ICD-10-CM | POA: Diagnosis not present

## 2020-06-19 DIAGNOSIS — R2681 Unsteadiness on feet: Secondary | ICD-10-CM | POA: Diagnosis not present

## 2020-06-19 DIAGNOSIS — I480 Paroxysmal atrial fibrillation: Secondary | ICD-10-CM | POA: Diagnosis not present

## 2020-06-19 DIAGNOSIS — R2689 Other abnormalities of gait and mobility: Secondary | ICD-10-CM | POA: Diagnosis not present

## 2020-06-19 DIAGNOSIS — M6258 Muscle wasting and atrophy, not elsewhere classified, other site: Secondary | ICD-10-CM | POA: Diagnosis not present

## 2020-06-19 DIAGNOSIS — F1027 Alcohol dependence with alcohol-induced persisting dementia: Secondary | ICD-10-CM | POA: Diagnosis not present

## 2020-06-19 DIAGNOSIS — E512 Wernicke's encephalopathy: Secondary | ICD-10-CM | POA: Diagnosis not present

## 2020-06-19 DIAGNOSIS — I10 Essential (primary) hypertension: Secondary | ICD-10-CM | POA: Diagnosis not present

## 2020-06-19 DIAGNOSIS — R1312 Dysphagia, oropharyngeal phase: Secondary | ICD-10-CM | POA: Diagnosis not present

## 2020-06-19 DIAGNOSIS — M6281 Muscle weakness (generalized): Secondary | ICD-10-CM | POA: Diagnosis not present

## 2020-06-19 DIAGNOSIS — J302 Other seasonal allergic rhinitis: Secondary | ICD-10-CM | POA: Diagnosis not present

## 2020-06-19 DIAGNOSIS — Z9181 History of falling: Secondary | ICD-10-CM | POA: Diagnosis not present

## 2020-06-20 ENCOUNTER — Other Ambulatory Visit: Payer: Self-pay

## 2020-06-20 ENCOUNTER — Ambulatory Visit (INDEPENDENT_AMBULATORY_CARE_PROVIDER_SITE_OTHER)
Admission: RE | Admit: 2020-06-20 | Discharge: 2020-06-20 | Disposition: A | Discharge status: Home / Self Care | Payer: Medicare Other | Source: Ambulatory Visit | Attending: Cardiology | Admitting: Cardiology

## 2020-06-20 DIAGNOSIS — I2510 Atherosclerotic heart disease of native coronary artery without angina pectoris: Secondary | ICD-10-CM | POA: Diagnosis not present

## 2020-06-20 DIAGNOSIS — M6258 Muscle wasting and atrophy, not elsewhere classified, other site: Secondary | ICD-10-CM | POA: Diagnosis not present

## 2020-06-20 DIAGNOSIS — Z9181 History of falling: Secondary | ICD-10-CM | POA: Diagnosis not present

## 2020-06-20 DIAGNOSIS — I712 Thoracic aortic aneurysm, without rupture, unspecified: Secondary | ICD-10-CM

## 2020-06-20 DIAGNOSIS — I700 Atherosclerosis of aorta: Secondary | ICD-10-CM | POA: Diagnosis not present

## 2020-06-20 MED ORDER — IOHEXOL 350 MG/ML SOLN
100.0000 mL | Freq: Once | INTRAVENOUS | Status: AC | PRN
  Administered 2020-06-20: 80 mL via INTRAVENOUS

## 2020-06-23 DIAGNOSIS — F1011 Alcohol abuse, in remission: Secondary | ICD-10-CM | POA: Diagnosis not present

## 2020-06-23 DIAGNOSIS — G4700 Insomnia, unspecified: Secondary | ICD-10-CM | POA: Diagnosis not present

## 2020-06-23 DIAGNOSIS — F4310 Post-traumatic stress disorder, unspecified: Secondary | ICD-10-CM | POA: Diagnosis not present

## 2020-06-23 DIAGNOSIS — F331 Major depressive disorder, recurrent, moderate: Secondary | ICD-10-CM | POA: Diagnosis not present

## 2020-06-23 DIAGNOSIS — L989 Disorder of the skin and subcutaneous tissue, unspecified: Secondary | ICD-10-CM | POA: Diagnosis not present

## 2020-06-24 DIAGNOSIS — R1312 Dysphagia, oropharyngeal phase: Secondary | ICD-10-CM | POA: Diagnosis not present

## 2020-06-24 DIAGNOSIS — R2689 Other abnormalities of gait and mobility: Secondary | ICD-10-CM | POA: Diagnosis not present

## 2020-06-24 DIAGNOSIS — M6281 Muscle weakness (generalized): Secondary | ICD-10-CM | POA: Diagnosis not present

## 2020-06-24 DIAGNOSIS — E512 Wernicke's encephalopathy: Secondary | ICD-10-CM | POA: Diagnosis not present

## 2020-06-24 DIAGNOSIS — M6258 Muscle wasting and atrophy, not elsewhere classified, other site: Secondary | ICD-10-CM | POA: Diagnosis not present

## 2020-06-24 DIAGNOSIS — R2681 Unsteadiness on feet: Secondary | ICD-10-CM | POA: Diagnosis not present

## 2020-06-24 DIAGNOSIS — Z9181 History of falling: Secondary | ICD-10-CM | POA: Diagnosis not present

## 2020-06-24 DIAGNOSIS — R41841 Cognitive communication deficit: Secondary | ICD-10-CM | POA: Diagnosis not present

## 2020-06-29 LAB — CUP PACEART REMOTE DEVICE CHECK
Date Time Interrogation Session: 20210919002959
Implantable Pulse Generator Implant Date: 20200928

## 2020-06-30 ENCOUNTER — Ambulatory Visit (INDEPENDENT_AMBULATORY_CARE_PROVIDER_SITE_OTHER): Payer: Medicare Other | Admitting: Emergency Medicine

## 2020-06-30 DIAGNOSIS — I480 Paroxysmal atrial fibrillation: Secondary | ICD-10-CM

## 2020-07-02 NOTE — Progress Notes (Signed)
[facility] Summary Report / Loop Recorder 

## 2020-07-08 ENCOUNTER — Other Ambulatory Visit: Payer: Self-pay | Admitting: Family Medicine

## 2020-07-08 DIAGNOSIS — F419 Anxiety disorder, unspecified: Secondary | ICD-10-CM

## 2020-07-14 DIAGNOSIS — F1011 Alcohol abuse, in remission: Secondary | ICD-10-CM | POA: Diagnosis not present

## 2020-07-14 DIAGNOSIS — F331 Major depressive disorder, recurrent, moderate: Secondary | ICD-10-CM | POA: Diagnosis not present

## 2020-07-14 DIAGNOSIS — G4700 Insomnia, unspecified: Secondary | ICD-10-CM | POA: Diagnosis not present

## 2020-07-17 DIAGNOSIS — R739 Hyperglycemia, unspecified: Secondary | ICD-10-CM | POA: Diagnosis not present

## 2020-07-17 DIAGNOSIS — Z794 Long term (current) use of insulin: Secondary | ICD-10-CM | POA: Diagnosis not present

## 2020-07-17 DIAGNOSIS — Z713 Dietary counseling and surveillance: Secondary | ICD-10-CM | POA: Diagnosis not present

## 2020-07-17 DIAGNOSIS — E119 Type 2 diabetes mellitus without complications: Secondary | ICD-10-CM | POA: Diagnosis not present

## 2020-07-18 DIAGNOSIS — E512 Wernicke's encephalopathy: Secondary | ICD-10-CM | POA: Diagnosis not present

## 2020-07-18 DIAGNOSIS — R300 Dysuria: Secondary | ICD-10-CM | POA: Diagnosis not present

## 2020-07-18 DIAGNOSIS — F4310 Post-traumatic stress disorder, unspecified: Secondary | ICD-10-CM | POA: Diagnosis not present

## 2020-07-18 DIAGNOSIS — R0981 Nasal congestion: Secondary | ICD-10-CM | POA: Diagnosis not present

## 2020-07-19 DIAGNOSIS — E1110 Type 2 diabetes mellitus with ketoacidosis without coma: Secondary | ICD-10-CM | POA: Diagnosis not present

## 2020-07-19 DIAGNOSIS — E119 Type 2 diabetes mellitus without complications: Secondary | ICD-10-CM | POA: Diagnosis not present

## 2020-07-21 DIAGNOSIS — N390 Urinary tract infection, site not specified: Secondary | ICD-10-CM | POA: Diagnosis not present

## 2020-07-21 DIAGNOSIS — E119 Type 2 diabetes mellitus without complications: Secondary | ICD-10-CM | POA: Diagnosis not present

## 2020-07-22 DIAGNOSIS — R0981 Nasal congestion: Secondary | ICD-10-CM | POA: Diagnosis not present

## 2020-07-22 DIAGNOSIS — N390 Urinary tract infection, site not specified: Secondary | ICD-10-CM | POA: Diagnosis not present

## 2020-07-22 DIAGNOSIS — E1165 Type 2 diabetes mellitus with hyperglycemia: Secondary | ICD-10-CM | POA: Diagnosis not present

## 2020-07-22 DIAGNOSIS — F1011 Alcohol abuse, in remission: Secondary | ICD-10-CM | POA: Diagnosis not present

## 2020-07-22 DIAGNOSIS — B9689 Other specified bacterial agents as the cause of diseases classified elsewhere: Secondary | ICD-10-CM | POA: Diagnosis not present

## 2020-07-22 DIAGNOSIS — F4310 Post-traumatic stress disorder, unspecified: Secondary | ICD-10-CM | POA: Diagnosis not present

## 2020-07-22 DIAGNOSIS — G4700 Insomnia, unspecified: Secondary | ICD-10-CM | POA: Diagnosis not present

## 2020-07-22 DIAGNOSIS — F331 Major depressive disorder, recurrent, moderate: Secondary | ICD-10-CM | POA: Diagnosis not present

## 2020-07-28 DIAGNOSIS — R4586 Emotional lability: Secondary | ICD-10-CM | POA: Diagnosis not present

## 2020-07-28 DIAGNOSIS — G4700 Insomnia, unspecified: Secondary | ICD-10-CM | POA: Diagnosis not present

## 2020-07-28 DIAGNOSIS — Z8669 Personal history of other diseases of the nervous system and sense organs: Secondary | ICD-10-CM | POA: Diagnosis not present

## 2020-07-28 DIAGNOSIS — R454 Irritability and anger: Secondary | ICD-10-CM | POA: Diagnosis not present

## 2020-08-04 DIAGNOSIS — R002 Palpitations: Secondary | ICD-10-CM | POA: Diagnosis not present

## 2020-08-04 DIAGNOSIS — G2581 Restless legs syndrome: Secondary | ICD-10-CM | POA: Diagnosis not present

## 2020-08-04 DIAGNOSIS — I2510 Atherosclerotic heart disease of native coronary artery without angina pectoris: Secondary | ICD-10-CM | POA: Diagnosis not present

## 2020-08-04 DIAGNOSIS — R9431 Abnormal electrocardiogram [ECG] [EKG]: Secondary | ICD-10-CM | POA: Diagnosis not present

## 2020-08-05 DIAGNOSIS — L821 Other seborrheic keratosis: Secondary | ICD-10-CM | POA: Diagnosis not present

## 2020-08-05 DIAGNOSIS — M7981 Nontraumatic hematoma of soft tissue: Secondary | ICD-10-CM | POA: Diagnosis not present

## 2020-08-05 DIAGNOSIS — L570 Actinic keratosis: Secondary | ICD-10-CM | POA: Diagnosis not present

## 2020-08-07 DIAGNOSIS — Z794 Long term (current) use of insulin: Secondary | ICD-10-CM | POA: Diagnosis not present

## 2020-08-07 DIAGNOSIS — F1027 Alcohol dependence with alcohol-induced persisting dementia: Secondary | ICD-10-CM | POA: Diagnosis not present

## 2020-08-07 DIAGNOSIS — E118 Type 2 diabetes mellitus with unspecified complications: Secondary | ICD-10-CM | POA: Diagnosis not present

## 2020-08-15 ENCOUNTER — Telehealth: Payer: Self-pay | Admitting: Internal Medicine

## 2020-08-15 NOTE — Telephone Encounter (Signed)
Bibb returning call - connected call to amy

## 2020-08-15 NOTE — Telephone Encounter (Signed)
Orland Dec returned my phone call, she will advise  nurses on floor to send transmission.  Pt scheduled for APP visit next week.

## 2020-08-15 NOTE — Telephone Encounter (Signed)
New message   Pt requested appt with JA because he feels like he is still having tachycardia. Asked Orland Dec if a log of HR was kept for pt, she said that she is transportation and schedules the appts. She said if someone calls back she can get the nurse for the patient.

## 2020-08-15 NOTE — Telephone Encounter (Signed)
Last remote transmission received 07/23/20 from ILR.  Will need manual transmission for updated data. Pt number on record no longer active.  Attempted to reach nurse at [facility] to request manual transmission from ILR.  No answer, LVM requesting callback.

## 2020-08-20 DIAGNOSIS — G2581 Restless legs syndrome: Secondary | ICD-10-CM | POA: Diagnosis not present

## 2020-08-20 DIAGNOSIS — E118 Type 2 diabetes mellitus with unspecified complications: Secondary | ICD-10-CM | POA: Diagnosis not present

## 2020-08-21 NOTE — Progress Notes (Signed)
Electrophysiology Office Note Date: 08/22/2020  ID:  Adam [name], DOB [birth year], MRN [id number]  PCP: [name], MD Primary Cardiologist: [name]: All[place]  CC: AF follow up  Adam [name] is a 69 y.o. male seen today for Dr Rayann Heman.  He presents today for routine electrophysiology followup.  Since last being seen in our clinic, the patient reports doing relatively well.   He was having some increased palpitations which have now resolved.  He denies chest pain, palpitations, dyspnea, PND, orthopnea, nausea, vomiting, dizziness, syncope, edema, weight gain, or early satiety.  ILR interrogation demonstrates no significant AF since end of October. Histograms are appropriate. See below.  Past Medical History:  Diagnosis Date  . Allergy   . Anxiety    history of PTSD following CABG  . Ascending aortic aneurysm ([place]) 01/31/2018   43 x 42 mm, pt unaware  . Asthma   . Cardiomegaly 10/17/2017  . Colitis- colonoscopy 2014 07/13/2015  . COPD (chronic obstructive pulmonary disease) ([place])   . Coronary artery disease    x 6  . Depression   . Diabetes mellitus without complication ([place])   . Family history of polyps in the colon   . Finger dislocation    Left pinkie  . GERD (gastroesophageal reflux disease)   . Gout   . H/O atrial fibrillation without current medication    following CABG with no documented episodes since then.  . Heart palpitations   . Hx of adenomatous colonic polyps 08/12/2010  . Hyperlipidemia   . Hypertension   . OA (osteoarthritis)   . OSA (obstructive sleep apnea)    Mild, has not received CPAP yet  . Prediabetes   . RLS (restless legs syndrome)   . 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 ANKLE FRACTURE SURGERY Right 1991  . APPENDECTOMY    . ATRIAL FIBRILLATION ABLATION N/A 03/31/2018   Procedure: ATRIAL FIBRILLATION ABLATION;  Surgeon: [name], MD;   Location: [facility];  Service: Cardiovascular;  Laterality: N/A;  . CARDIAC ELECTROPHYSIOLOGY MAPPING AND ABLATION    . COLONOSCOPY W/ BIOPSIES  2017   x7  . CORONARY ANGIOPLASTY WITH STENT PLACEMENT    . CORONARY ARTERY BYPASS GRAFT    . FINGER SURGERY  04/2018   Small finger left hand  . implantable loop recorder placement  07/02/2019   Medtronic Reveal [place] model [id number] ([place] [id number]) implanted in office by Dr Rayann Heman  . TEE WITHOUT CARDIOVERSION N/A 03/30/2018   Procedure: TRANSESOPHAGEAL ECHOCARDIOGRAM (TEE);  Surgeon: [name], MD;  Location: [facility];  Service: Cardiovascular;  Laterality: N/A;  . TOTAL HIP ARTHROPLASTY Left   . TOTAL HIP ARTHROPLASTY Right 09/12/2018   Procedure: TOTAL HIP ARTHROPLASTY ANTERIOR APPROACH;  Surgeon: [name], MD;  [facility]: [facility];  Service: Orthopedics;  Laterality: Right;  70 mins    Current Outpatient Medications  Medication Sig Dispense Refill  . allopurinol (ZYLOPRIM) 300 MG tablet Take 1 tablet (300 mg total) by mouth daily. 90 tablet 3  . apixaban (ELIQUIS) 5 MG TABS tablet Take 1 tablet (5 mg total) by mouth 2 (two) times daily. 60 tablet 1  . diltiazem (CARTIA XT) 300 MG 24 hr capsule Take 1 capsule (300 mg total) by mouth daily. 30 capsule 3  . dofetilide (TIKOSYN) 125 MCG capsule Take 1 capsule (125 mcg total) by mouth 2 (two) times daily. May have 90 day  refills if patient prefers 60 capsule 11  . DULoxetine (CYMBALTA) 60 MG capsule Take 1 capsule (60 mg total) by mouth daily. 30 capsule 3  . feeding supplement, ENSURE ENLIVE, (ENSURE ENLIVE) LIQD Take 237 mLs by mouth daily. 237 mL 12  . fluticasone (FLONASE) 50 MCG/ACT nasal spray Place 1 spray into both nostrils daily as needed for allergies or rhinitis.     . Fluticasone-Umeclidin-Vilant (TRELEGY ELLIPTA) 100-62.5-25 MCG/INH AEPB Inhale 1 puff into the lungs daily. 60 each 3  . gabapentin (NEURONTIN) 300 MG capsule Take 1 capsule (300 mg total) by mouth at  bedtime. Take at night as needed for restless leg symptoms 1 capsule 0  . glucose blood (ONETOUCH VERIO) test strip Test once daily.  Dx E11.9 100 each 3  . insulin glargine (LANTUS SOLOSTAR) 100 UNIT/ML Solostar Pen Inject 18 Units into the skin daily. 15 mL 11  . JANUVIA 100 MG tablet TAKE 1 TABLET(100 MG) BY MOUTH DAILY 90 tablet 1  . montelukast (SINGULAIR) 10 MG tablet Take 10 mg by mouth at bedtime.    . Multiple Vitamin (MULTIVITAMIN WITH MINERALS) TABS tablet Take 1 tablet by mouth daily.    . potassium chloride SA (KLOR-CON) 20 MEQ tablet Take 1 tablet (20 mEq total) by mouth daily.    . rosuvastatin (CRESTOR) 40 MG tablet Take 1 tablet (40 mg total) by mouth daily. 90 tablet 3  . thiamine 100 MG tablet Take 100 mg by mouth daily.    . traMADol (ULTRAM) 50 MG tablet Take 1 tablet by mouth as needed for pain.    Marland Kitchen VASCEPA 1 g capsule TAKE 2 CAPSULES(2 GRAMS) BY MOUTH TWICE DAILY 180 capsule 1  . losartan (COZAAR) 100 MG tablet Take 1 tablet (100 mg total) by mouth daily. 90 tablet 0   No current facility-administered medications for this visit.    Allergies:   Xarelto [rivaroxaban], Amoxicillin, Augmentin [amoxicillin-pot clavulanate], Azithromycin, Clindamycin/lincomycin, and Keflex [cephalexin]   Social History: Social History   Socioeconomic History  . Marital status: Married    Spouse name: Not on file  . Number of children: 0  . Years of education: Not on file  . Highest education level: Not on file  Occupational History  . Occupation: retired  Tobacco Use  . Smoking status: Former Smoker    Packs/day: 1.00    Years: 7.00    Pack years: 7.00    Types: Cigarettes    Quit date: 10/04/1977    Years since quitting: 42.9  . Smokeless tobacco: Never Used  . Tobacco comment: never smoked over 1 pack   Vaping Use  . Vaping Use: Never used  Substance and Sexual Activity  . Alcohol use: Yes    Alcohol/week: 3.0 - 4.0 standard drinks    Types: 1 Glasses of wine, 2 - 3  Shots of liquor per week    Comment: DAILY  . Drug use: No    Comment: Smoked Marijuana back in 1970s  . Sexual activity: Yes  Other Topics Concern  . 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 Difficulty of Paying Living Expenses: Not very hard  Food Insecurity: No Food Insecurity  .  Worried About Charity fundraiser in the Last Year: Never true  . Ran Out of Food in the Last Year: Never true  Transportation Needs: No Transportation Needs  . Lack of Transportation (Medical): No  . Lack of Transportation (Non-Medical): No  Physical Activity: Inactive  . Days of Exercise per Week: 0 days  . Minutes of Exercise per Session: 0 min  Stress: Stress Concern Present  . Feeling of Stress : Very much  Social Connections: Moderately Isolated  . Frequency of Communication with Friends and Family: More than three times a week  . Frequency of Social Gatherings with Friends and Family: Once a week  . Attends Religious Services: Never  . Active Member of Clubs or Organizations: No  . Attends Archivist Meetings: Never  . Marital Status: Married  Human resources officer Violence:   . Fear of Current or Ex-Partner: Not on file  . Emotionally Abused: Not on file  . Physically Abused: Not on file  . Sexually Abused: Not on file    Family History: Family History  Problem Relation Age of Onset  . Colon cancer Mother   . Cancer Father        UNKNOWN TYPE  . Heart disease Paternal Grandmother   . Heart disease Paternal Grandfather     Review of Systems: All other systems reviewed and are otherwise negative except as noted above.   Physical Exam: VS:  BP 134/82   Pulse 98   Ht 5\' 10"  (1.778 m)   Wt 219 lb  9.6 oz (99.6 kg)   SpO2 98%   BMI 31.51 kg/m  , BMI Body mass index is 31.51 kg/m. Wt Readings from Last 3 Encounters:  08/22/20 219 lb 9.6 oz (99.6 kg)  06/18/20 219 lb 9.6 oz (99.6 kg)  06/05/20 205 lb (93 kg)    GEN- The patient is elderly appearing, alert and oriented x 3 today.   HEENT: normocephalic, atraumatic; sclera clear, conjunctiva pink; hearing intact; oropharynx clear; neck supple  Lungs- Clear to ausculation bilaterally, normal work of breathing.  No wheezes, rales, rhonchi Heart- Regular rate and rhythm  GI- soft, non-tender, non-distended, bowel sounds present, no hepatosplenomegaly Extremities- no clubbing, cyanosis, or edema; DP/PT/radial pulses 2+ bilaterally MS- no significant deformity or atrophy Skin- warm and dry, no rash or lesion  Psych- euthymic mood, full affect Neuro- strength and sensation are intact   EKG:  EKG is ordered today. EKG today demonstrates SR, rate 98, Qtc 440msec  Recent Labs: 12/03/2019: Pro B Natriuretic peptide (BNP) 266.0 02/08/2020: TSH 3.137 02/20/2020: ALT 34 06/05/2020: BUN 9; Creatinine, Ser 0.82; Hemoglobin 10.5; Magnesium 1.8; Platelets 203; Potassium 4.0; Sodium 137    Other studies Reviewed: Additional studies/ records that were reviewed today include: Dr Rayann Heman and Dr Doug Sou office notes       Assessment and Plan:  1.  Paroxysmal atrial fibrillation Burden by ILR transmission last night 5.7%, significant improvement in AF burden recently.  Continue Eliquis for CHADS2VASC of 4 Continue Tikosyn EKG stable 06/2020 BMET, Mg today  2.  OSA Compliance with CPAP encouraged  3.  ETOH Avoidance advised     Current medicines are reviewed at length with the patient today.   The patient does not have concerns regarding his medicines.  The following changes were made today:  none  Labs/ tests ordered today include: BMET, Mg Orders Placed This Encounter  Procedures  . Basic Metabolic Panel (BMET)  . Magnesium      Disposition:  Follow up with Dr Rayann Heman 6 months     Signed, Chanetta Marshall, NP 08/22/2020 10:13 AM   [facility] [address] [place]  [address] [phone] (office) [phone] (fax)

## 2020-08-22 ENCOUNTER — Ambulatory Visit (INDEPENDENT_AMBULATORY_CARE_PROVIDER_SITE_OTHER): Payer: Medicare Other | Admitting: Nurse Practitioner

## 2020-08-22 ENCOUNTER — Encounter: Payer: Self-pay | Admitting: Nurse Practitioner

## 2020-08-22 ENCOUNTER — Other Ambulatory Visit: Payer: Self-pay

## 2020-08-22 VITALS — BP 134/82 | HR 98 | Ht 70.0 in | Wt 219.6 lb

## 2020-08-22 DIAGNOSIS — I480 Paroxysmal atrial fibrillation: Secondary | ICD-10-CM

## 2020-08-22 DIAGNOSIS — F1010 Alcohol abuse, uncomplicated: Secondary | ICD-10-CM

## 2020-08-22 DIAGNOSIS — G4733 Obstructive sleep apnea (adult) (pediatric): Secondary | ICD-10-CM | POA: Diagnosis not present

## 2020-08-22 LAB — BASIC METABOLIC PANEL
BUN/Creatinine Ratio: 14 (ref 10–24)
BUN: 9 mg/dL (ref 8–27)
CO2: 24 mmol/L (ref 20–29)
Calcium: 9.3 mg/dL (ref 8.6–10.2)
Chloride: 98 mmol/L (ref 96–106)
Creatinine, Ser: 0.64 mg/dL — ABNORMAL LOW (ref 0.76–1.27)
GFR calc Af Amer: 115 mL/min/{1.73_m2} (ref >59)
GFR calc non Af Amer: 99 mL/min/{1.73_m2} (ref >59)
Glucose: 270 mg/dL — ABNORMAL HIGH (ref 65–99)
Potassium: 3.3 mmol/L — ABNORMAL LOW (ref 3.5–5.2)
Sodium: 137 mmol/L (ref 134–144)

## 2020-08-22 LAB — MAGNESIUM: Magnesium: 1.7 mg/dL (ref 1.6–2.3)

## 2020-08-22 NOTE — Patient Instructions (Signed)
Medication Instructions:  *If you need a refill on your cardiac medications before your next appointment, please call your pharmacy*  Lab Work: Your physician has recommended that you have lab work today: BMET and Magnesium Levels  If you have labs (blood work) drawn today and your tests are completely normal, you will receive your results only by: Marland Kitchen MyChart Message (if you have MyChart) OR . A paper copy in the mail If you have any lab test that is abnormal or we need to change your treatment, we will call you to review the results.  Follow-Up: At [facility], you and your health needs are our priority.  As part of our continuing mission to provide you with exceptional heart care, we have created designated Provider Care Teams.  These Care Teams include your primary Cardiologist (physician) and Advanced Practice Providers (APPs -  Physician Assistants and Nurse Practitioners) who all work together to provide you with the care you need, when you need it.  We recommend signing up for the patient portal called "MyChart".  Sign up information is provided on this After Visit Summary.  MyChart is used to connect with patients for Virtual Visits (Telemedicine).  Patients are able to view lab/test results, encounter notes, upcoming appointments, etc.  Non-urgent messages can be sent to your provider as well.   To learn more about what you can do with MyChart, go to [email or web address].    Your next appointment:   Your physician recommends that you schedule a follow-up appointment in: 52 MONTHS with Dr. Rayann Heman  The format for your next appointment:   In Person with [name], MD

## 2020-08-23 LAB — CUP PACEART REMOTE DEVICE CHECK
Date Time Interrogation Session: 20211120013254
Implantable Pulse Generator Implant Date: 20200928

## 2020-08-25 ENCOUNTER — Ambulatory Visit (INDEPENDENT_AMBULATORY_CARE_PROVIDER_SITE_OTHER): Payer: Medicare Other

## 2020-08-25 ENCOUNTER — Telehealth: Payer: Self-pay

## 2020-08-25 DIAGNOSIS — I480 Paroxysmal atrial fibrillation: Secondary | ICD-10-CM | POA: Diagnosis not present

## 2020-08-25 DIAGNOSIS — Z8669 Personal history of other diseases of the nervous system and sense organs: Secondary | ICD-10-CM | POA: Diagnosis not present

## 2020-08-25 DIAGNOSIS — R4586 Emotional lability: Secondary | ICD-10-CM | POA: Diagnosis not present

## 2020-08-25 DIAGNOSIS — F331 Major depressive disorder, recurrent, moderate: Secondary | ICD-10-CM | POA: Diagnosis not present

## 2020-08-25 DIAGNOSIS — G4700 Insomnia, unspecified: Secondary | ICD-10-CM | POA: Diagnosis not present

## 2020-08-25 MED ORDER — MAGNESIUM OXIDE 400 MG PO CAPS: 1.0000 | ORAL_CAPSULE | Freq: Every day | ORAL | 11 refills | Status: DC

## 2020-08-25 NOTE — Telephone Encounter (Signed)
Received a message form Alda Lea, CPhT stating  "we received a prescription over in error for mag ox. we are a meds to bed pharmacy located inside the [place]. could you please send the prescription over to his regular community pharmacy so that he can pick it up there. thank you"  So I reached out to [facility] to figure out how the order needs to be communicated to them so that the patient can receive his medication. I left a voicemail with the charge nurse to call me back with instructions.

## 2020-08-25 NOTE — Telephone Encounter (Signed)
-----   Message from Patsey Berthold, NP sent at 08/23/2020 10:06 AM EST ----- Please notify patient of lab results.  K+ is low at 3.3.  Take 2meq K-dur twice daily for 3 days then resume 84meq daily.  Add Mag-Ox 400mg  daily.  Thanks!

## 2020-08-25 NOTE — Telephone Encounter (Signed)
Pt is aware and agreeable to lab results and recommendations Pt is agreeable to taking 20 meq of potassium for the next 3 days, then resuming normal dose Per pt request Mag OX 400 will be sent to [place] as he lives at [facility]

## 2020-08-25 NOTE — Addendum Note (Signed)
Addended by: Gar Ponto on: 08/25/2020 08:38 AM   Modules accepted: Orders

## 2020-08-26 ENCOUNTER — Telehealth: Payer: Self-pay | Admitting: Internal Medicine

## 2020-08-26 DIAGNOSIS — E876 Hypokalemia: Secondary | ICD-10-CM | POA: Diagnosis not present

## 2020-08-26 DIAGNOSIS — F1027 Alcohol dependence with alcohol-induced persisting dementia: Secondary | ICD-10-CM | POA: Diagnosis not present

## 2020-08-26 DIAGNOSIS — Z029 Encounter for administrative examinations, unspecified: Secondary | ICD-10-CM | POA: Diagnosis not present

## 2020-08-26 NOTE — Progress Notes (Signed)
[facility] Summary Report / Loop Recorder 

## 2020-08-26 NOTE — Telephone Encounter (Signed)
Fatmata with [facility] is requesting a faxed copy of patient's most recent lab results.   Phone #: [phone] Fax#: [phone]

## 2020-08-27 DIAGNOSIS — Z79899 Other long term (current) drug therapy: Secondary | ICD-10-CM | POA: Diagnosis not present

## 2020-08-27 DIAGNOSIS — D609 Acquired pure red cell aplasia, unspecified: Secondary | ICD-10-CM | POA: Diagnosis not present

## 2020-08-29 DIAGNOSIS — R2689 Other abnormalities of gait and mobility: Secondary | ICD-10-CM | POA: Diagnosis not present

## 2020-08-29 DIAGNOSIS — M6258 Muscle wasting and atrophy, not elsewhere classified, other site: Secondary | ICD-10-CM | POA: Diagnosis not present

## 2020-08-29 DIAGNOSIS — R451 Restlessness and agitation: Secondary | ICD-10-CM | POA: Diagnosis not present

## 2020-08-29 DIAGNOSIS — Z9181 History of falling: Secondary | ICD-10-CM | POA: Diagnosis not present

## 2020-08-29 DIAGNOSIS — E876 Hypokalemia: Secondary | ICD-10-CM | POA: Diagnosis not present

## 2020-08-29 DIAGNOSIS — R2681 Unsteadiness on feet: Secondary | ICD-10-CM | POA: Diagnosis not present

## 2020-08-29 DIAGNOSIS — R41841 Cognitive communication deficit: Secondary | ICD-10-CM | POA: Diagnosis not present

## 2020-08-29 DIAGNOSIS — M6281 Muscle weakness (generalized): Secondary | ICD-10-CM | POA: Diagnosis not present

## 2020-08-29 DIAGNOSIS — R1312 Dysphagia, oropharyngeal phase: Secondary | ICD-10-CM | POA: Diagnosis not present

## 2020-08-29 DIAGNOSIS — E512 Wernicke's encephalopathy: Secondary | ICD-10-CM | POA: Diagnosis not present

## 2020-08-30 DIAGNOSIS — N390 Urinary tract infection, site not specified: Secondary | ICD-10-CM | POA: Diagnosis not present

## 2020-09-01 DIAGNOSIS — R2689 Other abnormalities of gait and mobility: Secondary | ICD-10-CM | POA: Diagnosis not present

## 2020-09-01 DIAGNOSIS — E512 Wernicke's encephalopathy: Secondary | ICD-10-CM | POA: Diagnosis not present

## 2020-09-01 DIAGNOSIS — M6258 Muscle wasting and atrophy, not elsewhere classified, other site: Secondary | ICD-10-CM | POA: Diagnosis not present

## 2020-09-01 DIAGNOSIS — R41841 Cognitive communication deficit: Secondary | ICD-10-CM | POA: Diagnosis not present

## 2020-09-01 DIAGNOSIS — Z9181 History of falling: Secondary | ICD-10-CM | POA: Diagnosis not present

## 2020-09-01 DIAGNOSIS — R2681 Unsteadiness on feet: Secondary | ICD-10-CM | POA: Diagnosis not present

## 2020-09-01 DIAGNOSIS — R1312 Dysphagia, oropharyngeal phase: Secondary | ICD-10-CM | POA: Diagnosis not present

## 2020-09-01 DIAGNOSIS — M6281 Muscle weakness (generalized): Secondary | ICD-10-CM | POA: Diagnosis not present

## 2020-09-01 LAB — HM DIABETES EYE EXAM

## 2020-09-02 DIAGNOSIS — M6258 Muscle wasting and atrophy, not elsewhere classified, other site: Secondary | ICD-10-CM | POA: Diagnosis not present

## 2020-09-02 DIAGNOSIS — R2681 Unsteadiness on feet: Secondary | ICD-10-CM | POA: Diagnosis not present

## 2020-09-02 DIAGNOSIS — M6281 Muscle weakness (generalized): Secondary | ICD-10-CM | POA: Diagnosis not present

## 2020-09-02 DIAGNOSIS — E512 Wernicke's encephalopathy: Secondary | ICD-10-CM | POA: Diagnosis not present

## 2020-09-02 DIAGNOSIS — R1312 Dysphagia, oropharyngeal phase: Secondary | ICD-10-CM | POA: Diagnosis not present

## 2020-09-02 DIAGNOSIS — R41841 Cognitive communication deficit: Secondary | ICD-10-CM | POA: Diagnosis not present

## 2020-09-02 DIAGNOSIS — Z9181 History of falling: Secondary | ICD-10-CM | POA: Diagnosis not present

## 2020-09-02 DIAGNOSIS — R2689 Other abnormalities of gait and mobility: Secondary | ICD-10-CM | POA: Diagnosis not present

## 2020-09-03 DIAGNOSIS — Z9181 History of falling: Secondary | ICD-10-CM | POA: Diagnosis not present

## 2020-09-03 DIAGNOSIS — R2689 Other abnormalities of gait and mobility: Secondary | ICD-10-CM | POA: Diagnosis not present

## 2020-09-03 DIAGNOSIS — R2681 Unsteadiness on feet: Secondary | ICD-10-CM | POA: Diagnosis not present

## 2020-09-03 DIAGNOSIS — R41841 Cognitive communication deficit: Secondary | ICD-10-CM | POA: Diagnosis not present

## 2020-09-03 DIAGNOSIS — M6281 Muscle weakness (generalized): Secondary | ICD-10-CM | POA: Diagnosis not present

## 2020-09-03 DIAGNOSIS — M6258 Muscle wasting and atrophy, not elsewhere classified, other site: Secondary | ICD-10-CM | POA: Diagnosis not present

## 2020-09-03 DIAGNOSIS — R1312 Dysphagia, oropharyngeal phase: Secondary | ICD-10-CM | POA: Diagnosis not present

## 2020-09-03 DIAGNOSIS — E512 Wernicke's encephalopathy: Secondary | ICD-10-CM | POA: Diagnosis not present

## 2020-09-04 DIAGNOSIS — Z794 Long term (current) use of insulin: Secondary | ICD-10-CM | POA: Diagnosis not present

## 2020-09-04 DIAGNOSIS — M6258 Muscle wasting and atrophy, not elsewhere classified, other site: Secondary | ICD-10-CM | POA: Diagnosis not present

## 2020-09-04 DIAGNOSIS — R451 Restlessness and agitation: Secondary | ICD-10-CM | POA: Diagnosis not present

## 2020-09-04 DIAGNOSIS — R41841 Cognitive communication deficit: Secondary | ICD-10-CM | POA: Diagnosis not present

## 2020-09-04 DIAGNOSIS — M6281 Muscle weakness (generalized): Secondary | ICD-10-CM | POA: Diagnosis not present

## 2020-09-04 DIAGNOSIS — E512 Wernicke's encephalopathy: Secondary | ICD-10-CM | POA: Diagnosis not present

## 2020-09-04 DIAGNOSIS — E119 Type 2 diabetes mellitus without complications: Secondary | ICD-10-CM | POA: Diagnosis not present

## 2020-09-04 DIAGNOSIS — R1312 Dysphagia, oropharyngeal phase: Secondary | ICD-10-CM | POA: Diagnosis not present

## 2020-09-04 DIAGNOSIS — E1165 Type 2 diabetes mellitus with hyperglycemia: Secondary | ICD-10-CM | POA: Diagnosis not present

## 2020-09-04 DIAGNOSIS — R2681 Unsteadiness on feet: Secondary | ICD-10-CM | POA: Diagnosis not present

## 2020-09-04 DIAGNOSIS — R2689 Other abnormalities of gait and mobility: Secondary | ICD-10-CM | POA: Diagnosis not present

## 2020-09-04 DIAGNOSIS — Z9181 History of falling: Secondary | ICD-10-CM | POA: Diagnosis not present

## 2020-09-04 NOTE — Telephone Encounter (Signed)
Most recent labs faxed over 09/04/20

## 2020-09-05 DIAGNOSIS — Z794 Long term (current) use of insulin: Secondary | ICD-10-CM | POA: Diagnosis not present

## 2020-09-05 DIAGNOSIS — R2681 Unsteadiness on feet: Secondary | ICD-10-CM | POA: Diagnosis not present

## 2020-09-05 DIAGNOSIS — M6258 Muscle wasting and atrophy, not elsewhere classified, other site: Secondary | ICD-10-CM | POA: Diagnosis not present

## 2020-09-05 DIAGNOSIS — F1099 Alcohol use, unspecified with unspecified alcohol-induced disorder: Secondary | ICD-10-CM | POA: Diagnosis not present

## 2020-09-05 DIAGNOSIS — Z9181 History of falling: Secondary | ICD-10-CM | POA: Diagnosis not present

## 2020-09-05 DIAGNOSIS — R41841 Cognitive communication deficit: Secondary | ICD-10-CM | POA: Diagnosis not present

## 2020-09-05 DIAGNOSIS — R2689 Other abnormalities of gait and mobility: Secondary | ICD-10-CM | POA: Diagnosis not present

## 2020-09-05 DIAGNOSIS — M6281 Muscle weakness (generalized): Secondary | ICD-10-CM | POA: Diagnosis not present

## 2020-09-05 DIAGNOSIS — R1312 Dysphagia, oropharyngeal phase: Secondary | ICD-10-CM | POA: Diagnosis not present

## 2020-09-05 DIAGNOSIS — E512 Wernicke's encephalopathy: Secondary | ICD-10-CM | POA: Diagnosis not present

## 2020-09-05 DIAGNOSIS — E119 Type 2 diabetes mellitus without complications: Secondary | ICD-10-CM | POA: Diagnosis not present

## 2020-09-09 ENCOUNTER — Ambulatory Visit (INDEPENDENT_AMBULATORY_CARE_PROVIDER_SITE_OTHER): Payer: Medicare Other | Admitting: Family Medicine

## 2020-09-09 ENCOUNTER — Other Ambulatory Visit: Payer: Self-pay

## 2020-09-09 ENCOUNTER — Encounter: Payer: Self-pay | Admitting: Family Medicine

## 2020-09-09 VITALS — BP 132/80 | HR 123 | Ht 70.0 in | Wt 215.0 lb

## 2020-09-09 DIAGNOSIS — I10 Essential (primary) hypertension: Secondary | ICD-10-CM

## 2020-09-09 DIAGNOSIS — I4819 Other persistent atrial fibrillation: Secondary | ICD-10-CM

## 2020-09-09 DIAGNOSIS — E1165 Type 2 diabetes mellitus with hyperglycemia: Secondary | ICD-10-CM | POA: Diagnosis not present

## 2020-09-09 DIAGNOSIS — E785 Hyperlipidemia, unspecified: Secondary | ICD-10-CM

## 2020-09-09 DIAGNOSIS — E1169 Type 2 diabetes mellitus with other specified complication: Secondary | ICD-10-CM | POA: Diagnosis not present

## 2020-09-09 MED ORDER — DIVALPROEX SODIUM 125 MG PO CSDR: 125.0000 mg | DELAYED_RELEASE_CAPSULE | Freq: Three times a day (TID) | ORAL | 1 refills | Status: DC

## 2020-09-09 MED ORDER — ROSUVASTATIN CALCIUM 40 MG PO TABS: 40.0000 mg | ORAL_TABLET | Freq: Every day | ORAL | 3 refills | Status: DC

## 2020-09-09 MED ORDER — DILTIAZEM HCL ER COATED BEADS 360 MG PO CP24
360.0000 mg | ORAL_CAPSULE | Freq: Every day | ORAL | 3 refills | Status: DC
Start: 2020-09-09 — End: 2021-03-20

## 2020-09-09 NOTE — Patient Instructions (Signed)
PLEASE SET UP 30 MINUTE FOLLOW UP

## 2020-09-09 NOTE — Progress Notes (Signed)
Established Patient Office Visit  Subjective:  Patient ID: Adam [name], male    DOB: 1950-10-20  Age: 69 y.o. MRN: [id number]  CC:  Chief Complaint  Patient presents with  . Follow-up    HPI  Adam [name] presents for follow-up requesting some medication refills.  He has complex past medical history last spring and had several falls with significant head injury.  He was admitted back in May and discharged to skilled nursing facility.  At the time of his admission to skilled nursing he could not ambulate.  He had extensive rehab and just got out to return back to home to live with his wife this week.  He states he is needing refills of 3 medications including Cardizem CD, Crestor, and Depakote.  He has no history of seizures and apparently was placed on the Depakote because of some behavioral issues and agitation.  He had been consuming alcohol heavily at the time that he was admitted with complications from fall.  Currently ambulating without assistance.  We have record of medications but do not have full records from rehab at this time  Past medical history significant for atrial fibrillation, hypertension, CAD, COPD, type 2 diabetes, hyperlipidemia.  He was only given 15 minute appt today (we took 30 minutes to review and assess) and we need substantially more time to review recent events and to review medications in detail.    Past Medical History:  Diagnosis Date  . Allergy   . Anxiety    history of PTSD following CABG  . Ascending aortic aneurysm ([place]) 01/31/2018   43 x 42 mm, pt unaware  . Asthma   . Cardiomegaly 10/17/2017  . Colitis- colonoscopy 2014 07/13/2015  . COPD (chronic obstructive pulmonary disease) ([place])   . Coronary artery disease    x 6  . Depression   . Diabetes mellitus without complication ([place])   . Family history of polyps in the colon   . Finger dislocation    Left pinkie  . GERD (gastroesophageal reflux disease)   . Gout   . H/O atrial  fibrillation without current medication    following CABG with no documented episodes since then.  . Heart palpitations   . Hx of adenomatous colonic polyps 08/12/2010  . Hyperlipidemia   . Hypertension   . OA (osteoarthritis)   . OSA (obstructive sleep apnea)    Mild, has not received CPAP yet  . Prediabetes   . RLS (restless legs syndrome)   . 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 ANKLE FRACTURE SURGERY Right 1991  . APPENDECTOMY    . ATRIAL FIBRILLATION ABLATION N/A 03/31/2018   Procedure: ATRIAL FIBRILLATION ABLATION;  Surgeon: [name], MD;  Location: [facility];  Service: Cardiovascular;  Laterality: N/A;  . CARDIAC ELECTROPHYSIOLOGY MAPPING AND ABLATION    . COLONOSCOPY W/ BIOPSIES  2017   x7  . CORONARY ANGIOPLASTY WITH STENT PLACEMENT    . CORONARY ARTERY BYPASS GRAFT    . FINGER SURGERY  04/2018   Small finger left hand  . implantable loop recorder placement  07/02/2019   Medtronic Reveal [place] model [id number] ([place] [id number]) implanted in office by Dr Rayann Heman  . TEE WITHOUT CARDIOVERSION N/A 03/30/2018   Procedure: TRANSESOPHAGEAL ECHOCARDIOGRAM (TEE);  Surgeon: [name], MD;  Location: [facility];  Service: Cardiovascular;  Laterality: N/A;  . TOTAL HIP ARTHROPLASTY Left   . TOTAL  HIP ARTHROPLASTY Right 09/12/2018   Procedure: TOTAL HIP ARTHROPLASTY ANTERIOR APPROACH;  Surgeon: [name], MD;  Location: [facility];  Service: Orthopedics;  Laterality: Right;  70 mins    Family History  Problem Relation Age of Onset  . Colon cancer Mother   . Cancer Father        UNKNOWN TYPE  . Heart disease Paternal Grandmother   . Heart disease Paternal Grandfather     Social History   Socioeconomic History  . Marital status: Married    Spouse name: Not on file  . Number of children: 0  . Years of education: Not on file  . Highest education level: Not on file  Occupational History  .  Occupation: retired  Tobacco Use  . Smoking status: Former Smoker    Packs/day: 1.00    Years: 7.00    Pack years: 7.00    Types: Cigarettes    Quit date: 10/04/1977    Years since quitting: 42.9  . Smokeless tobacco: Never Used  . Tobacco comment: never smoked over 1 pack   Vaping Use  . Vaping Use: Never used  Substance and Sexual Activity  . Alcohol use: Yes    Alcohol/week: 3.0 - 4.0 standard drinks    Types: 1 Glasses of wine, 2 - 3 Shots of liquor per week    Comment: DAILY  . Drug use: No    Comment: Smoked Marijuana back in 1970s  . Sexual activity: Yes  Other Topics Concern  . 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 Difficulty of Paying Living Expenses: Not very hard  Food Insecurity: No Food Insecurity  . Worried About Charity fundraiser in the Last Year: Never true  . Ran Out of Food in the Last Year: Never true  Transportation Needs: No Transportation Needs  . Lack of Transportation (Medical): No  . Lack of Transportation (Non-Medical): No  Physical Activity: Inactive  . Days of Exercise per Week: 0 days  . Minutes of Exercise per Session: 0 min  Stress: Stress Concern Present  . Feeling of Stress : Very much  Social Connections: Moderately Isolated  . Frequency of Communication with Friends and Family: More than three times a week  . Frequency of Social Gatherings with Friends and Family: Once a week  . Attends Religious Services: Never  . Active Member of Clubs or Organizations: No  . Attends Archivist Meetings: Never  . Marital Status: Married  Human resources officer Violence:   . Fear of Current or Ex-Partner: Not on file  . Emotionally Abused: Not on file  .  Physically Abused: Not on file  . Sexually Abused: Not on file    Outpatient Medications Prior to Visit  Medication Sig Dispense Refill  . acetaminophen (TYLENOL) 325 MG tablet Take 325 mg by mouth every 8 (eight) hours as needed.    Marland Kitchen allopurinol (ZYLOPRIM) 100 MG tablet Take 100 mg by mouth daily.    Marland Kitchen apixaban (ELIQUIS) 5 MG TABS tablet Take 1 tablet (5 mg total) by mouth 2 (two) times daily. 60 tablet 1  . B COMPLEX-C-FOLIC ACID PO Take by mouth daily.    Marland Kitchen  DULoxetine (CYMBALTA) 60 MG capsule Take 1 capsule (60 mg total) by mouth daily. 30 capsule 3  . famotidine (PEPCID) 20 MG tablet Take 20 mg by mouth daily.    . Fluticasone-Umeclidin-Vilant (TRELEGY ELLIPTA) 100-62.5-25 MCG/INH AEPB Inhale 1 puff into the lungs daily. 60 each 3  . gabapentin (NEURONTIN) 300 MG capsule Take 1 capsule (300 mg total) by mouth at bedtime. Take at night as needed for restless leg symptoms (Patient taking differently: Take 300 mg by mouth at bedtime. Patient says he takes up to 4 tablets as needed) 1 capsule 0  . insulin aspart (NOVOLOG) 100 UNIT/ML injection Inject into the skin 3 (three) times daily before meals.    . insulin glargine (LANTUS SOLOSTAR) 100 UNIT/ML Solostar Pen Inject 18 Units into the skin daily. 15 mL 11  . JANUVIA 100 MG tablet TAKE 1 TABLET(100 MG) BY MOUTH DAILY 90 tablet 1  . Lidocaine HCl (ASPERCREME LIDOCAINE) 4 % CREA Apply topically.    Marland Kitchen LORazepam (ATIVAN) 0.5 MG tablet Take 0.5 mg by mouth at bedtime as needed for anxiety.    Marland Kitchen losartan (COZAAR) 100 MG tablet Take 1 tablet (100 mg total) by mouth daily. 90 tablet 0  . Magnesium Oxide 400 MG CAPS Take 1 capsule (400 mg total) by mouth daily. 30 capsule 11  . metFORMIN (GLUCOPHAGE-XR) 500 MG 24 hr tablet Take 500 mg by mouth in the morning and at bedtime.    . montelukast (SINGULAIR) 10 MG tablet Take 10 mg by mouth at bedtime.    . Multiple Vitamin (MULTIVITAMIN WITH MINERALS) TABS tablet Take 1 tablet by mouth daily.    .  potassium chloride SA (KLOR-CON) 20 MEQ tablet Take 1 tablet (20 mEq total) by mouth daily.    . PSYLLIUM HUSK PO Take by mouth. 2 cap twice daily    . saccharomyces boulardii (FLORASTOR) 250 MG capsule Take 250 mg by mouth 2 (two) times daily.    Marland Kitchen thiamine (VITAMIN B-1) 100 MG tablet Take 100 mg by mouth daily.    Marland Kitchen VASCEPA 1 g capsule TAKE 2 CAPSULES(2 GRAMS) BY MOUTH TWICE DAILY 180 capsule 1  . diltiazem (CARDIZEM CD) 360 MG 24 hr capsule Take 360 mg by mouth daily.    . divalproex (DEPAKOTE SPRINKLE) 125 MG capsule Take 125 mg by mouth 3 (three) times daily.    Marland Kitchen gabapentin (NEURONTIN) 300 MG capsule Take 300 mg by mouth 3 (three) times daily.    . rosuvastatin (CRESTOR) 40 MG tablet Take 1 tablet (40 mg total) by mouth daily. 90 tablet 3  . glucose blood (ONETOUCH VERIO) test strip Test once daily.  Dx E11.9 100 each 3  . allopurinol (ZYLOPRIM) 300 MG tablet Take 1 tablet (300 mg total) by mouth daily. 90 tablet 3  . diltiazem (CARTIA XT) 300 MG 24 hr capsule Take 1 capsule (300 mg total) by mouth daily. 30 capsule 3  . dofetilide (TIKOSYN) 125 MCG capsule Take 1 capsule (125 mcg total) by mouth 2 (two) times daily. May have 90 day refills if patient prefers 60 capsule 11  . feeding supplement, ENSURE ENLIVE, (ENSURE ENLIVE) LIQD Take 237 mLs by mouth daily. 237 mL 12  . fluticasone (FLONASE) 50 MCG/ACT nasal spray Place 1 spray into both nostrils daily as needed for allergies or rhinitis.     Marland Kitchen thiamine 100 MG tablet Take 100 mg by mouth daily.    . traMADol (ULTRAM) 50 MG tablet Take 1 tablet by mouth as needed  for pain.     No facility-administered medications prior to visit.    Allergies  Allergen Reactions  . Xarelto [Rivaroxaban] Other (See Comments)    Patient stated he "ended up in the hospital with a rectal bleed"  . Amoxicillin Rash and Other (See Comments)    * SEVERE RASH IN GROIN AREA Has patient had a PCN reaction causing immediate rash, facial/tongue/throat swelling,  SOB or lightheadedness with hypotension: no Has patient had a PCN reaction causing severe rash involving mucus membranes or skin necrosis: no Has patient had a PCN reaction that required hospitalization: no Has patient had a PCN reaction occurring within the last 10 years: yes If all of the above answers are "NO", then may proceed with Cephalosporin use.   . Augmentin [Amoxicillin-Pot Clavulanate] Rash and Other (See Comments)    * SEVERE RASH IN GROIN AREA  . Azithromycin Rash and Other (See Comments)    * SEVERE RASH IN GROIN AREA  . Clindamycin/Lincomycin Rash  . Keflex [Cephalexin] Rash    ROS Review of Systems  Constitutional: Negative for fatigue and unexpected weight change.  Eyes: Negative for visual disturbance.  Respiratory: Negative for cough, chest tightness and shortness of breath.   Cardiovascular: Negative for chest pain, palpitations and leg swelling.  Endocrine: Negative for polydipsia and polyuria.  Neurological: Negative for dizziness, syncope, weakness, light-headedness and headaches.      Objective:    Physical Exam  BP 132/80   Pulse (!) 123   Ht 5\' 10"  (1.778 m)   Wt 215 lb (97.5 kg)   BMI 30.85 kg/m  Wt Readings from Last 3 Encounters:  09/09/20 215 lb (97.5 kg)  08/22/20 219 lb 9.6 oz (99.6 kg)  06/18/20 219 lb 9.6 oz (99.6 kg)     Health Maintenance Due  Topic Date Due  . COLONOSCOPY  11/19/2018  . TETANUS/TDAP  10/07/2019  . INFLUENZA VACCINE  05/04/2020  . HEMOGLOBIN A1C  09/01/2020    There are no preventive care reminders to display for this patient.  Lab Results  Component Value Date   TSH 3.137 02/08/2020   Lab Results  Component Value Date   WBC 7.6 06/05/2020   HGB 10.5 (L) 06/05/2020   HCT 32.0 (L) 06/05/2020   MCV 97 06/05/2020   PLT 203 06/05/2020   Lab Results  Component Value Date   NA 137 08/22/2020   K 3.3 (L) 08/22/2020   CO2 24 08/22/2020   GLUCOSE 270 (H) 08/22/2020   BUN 9 08/22/2020   CREATININE 0.64  (L) 08/22/2020   BILITOT 0.9 02/20/2020   ALKPHOS 69 02/20/2020   AST 32 02/20/2020   ALT 34 02/20/2020   PROT 7.2 02/20/2020   ALBUMIN 3.3 (L) 03/03/2020   CALCIUM 9.3 08/22/2020   ANIONGAP 8 03/03/2020   GFR 75.74 12/03/2019   Lab Results  Component Value Date   CHOL 108 02/21/2020   Lab Results  Component Value Date   HDL 20 (L) 02/21/2020   Lab Results  Component Value Date   LDLCALC 55 02/21/2020   Lab Results  Component Value Date   TRIG 163 (H) 02/21/2020   Lab Results  Component Value Date   CHOLHDL 5.4 02/21/2020   Lab Results  Component Value Date   HGBA1C 6.9 (H) 03/01/2020      Assessment & Plan:   #1 hypertension.  BP stable today.  Patient requesting refills of diltiazem 360 mg. -Refill sent for 1 year  #2 dyslipidemia treated with  Crestor -Refill Crestor 40 mg daily for 1 year -Schedule follow-up and obtain repeat labs then  #3 type 2 diabetes.  Unknown current level of control.  We will plan to bring back for more extensive follow-up soon and recheck A1c then  #4 Depakote therapy.  Apparently was placed on this for some behavioral issues at [facility].  We have not had time to review full records yet. -We agreed to go ahead and refill the Depakote 125 mg capsules 3 times daily  #5 chronic anxiety/insomnia.  Pt came to [place] several years ago taking very high dose lorazepam 1 mg QID.  We tapered back his dose.  I expressed my reservations about him taking ANY benzo with hx of recent falls and ETOH abuse.  He apparently was still getting lorazepam 0.5 mg qhs at [facility]  However, he was in controlled environment there and was not using any ETOH.  He states he plans to take "3 drinks per day" now that he is at home.  I feel that he should be completely off benzos given history above.    Meds ordered this encounter  Medications  . divalproex (DEPAKOTE SPRINKLE) 125 MG capsule    Sig: Take 1 capsule (125 mg total) by mouth 3 (three) times daily.     Dispense:  270 capsule    Refill:  1  . diltiazem (CARDIZEM CD) 360 MG 24 hr capsule    Sig: Take 1 capsule (360 mg total) by mouth daily.    Dispense:  90 capsule    Refill:  3  . rosuvastatin (CRESTOR) 40 MG tablet    Sig: Take 1 tablet (40 mg total) by mouth daily.    Dispense:  90 tablet    Refill:  3    Follow-up: Return in about 2 weeks (around 09/23/2020).    Carolann Littler, MD

## 2020-09-11 ENCOUNTER — Encounter: Payer: Self-pay | Admitting: Family Medicine

## 2020-09-12 ENCOUNTER — Encounter: Payer: Self-pay | Admitting: Family Medicine

## 2020-09-15 MED ORDER — EMPAGLIFLOZIN 25 MG PO TABS: 25.0000 mg | ORAL_TABLET | Freq: Every day | ORAL | 1 refills | Status: DC

## 2020-09-15 MED ORDER — MAGNESIUM OXIDE 400 MG PO CAPS
1.0000 | ORAL_CAPSULE | Freq: Every day | ORAL | 1 refills | Status: DC
Start: 2020-09-15 — End: 2023-12-30

## 2020-09-15 MED ORDER — APIXABAN 5 MG PO TABS
5.0000 mg | ORAL_TABLET | Freq: Two times a day (BID) | ORAL | 1 refills | Status: DC
Start: 2020-09-15 — End: 2021-03-04

## 2020-09-15 MED ORDER — DIVALPROEX SODIUM 125 MG PO CSDR
125.0000 mg | DELAYED_RELEASE_CAPSULE | Freq: Three times a day (TID) | ORAL | 1 refills | Status: DC
Start: 2020-09-15 — End: 2021-03-31

## 2020-09-15 MED ORDER — LOSARTAN POTASSIUM 100 MG PO TABS: 100.0000 mg | ORAL_TABLET | Freq: Every day | ORAL | 1 refills | Status: DC

## 2020-09-15 MED ORDER — NOVOFINE PEN NEEDLE 32G X 6 MM MISC: 3 refills | Status: DC

## 2020-09-15 MED ORDER — LANTUS SOLOSTAR 100 UNIT/ML ~~LOC~~ SOPN
18.0000 [IU] | PEN_INJECTOR | Freq: Every day | SUBCUTANEOUS | 1 refills | Status: DC
Start: 2020-09-15 — End: 2020-09-23

## 2020-09-19 ENCOUNTER — Encounter: Payer: Self-pay | Admitting: Family Medicine

## 2020-09-23 ENCOUNTER — Ambulatory Visit (INDEPENDENT_AMBULATORY_CARE_PROVIDER_SITE_OTHER): Payer: Medicare Other | Admitting: Family Medicine

## 2020-09-23 ENCOUNTER — Other Ambulatory Visit: Payer: Self-pay

## 2020-09-23 DIAGNOSIS — E785 Hyperlipidemia, unspecified: Secondary | ICD-10-CM

## 2020-09-23 DIAGNOSIS — F419 Anxiety disorder, unspecified: Secondary | ICD-10-CM | POA: Diagnosis not present

## 2020-09-23 DIAGNOSIS — E1169 Type 2 diabetes mellitus with other specified complication: Secondary | ICD-10-CM | POA: Diagnosis not present

## 2020-09-23 DIAGNOSIS — E1165 Type 2 diabetes mellitus with hyperglycemia: Secondary | ICD-10-CM | POA: Diagnosis not present

## 2020-09-23 DIAGNOSIS — I10 Essential (primary) hypertension: Secondary | ICD-10-CM | POA: Diagnosis not present

## 2020-09-23 LAB — CUP PACEART REMOTE DEVICE CHECK
Date Time Interrogation Session: 20211221013211
Implantable Pulse Generator Implant Date: 20200928

## 2020-09-23 MED ORDER — POTASSIUM CHLORIDE CRYS ER 20 MEQ PO TBCR: EXTENDED_RELEASE_TABLET | ORAL | 3 refills | Status: DC

## 2020-09-23 MED ORDER — METFORMIN HCL ER 500 MG PO TB24
500.0000 mg | ORAL_TABLET | Freq: Two times a day (BID) | ORAL | 3 refills | Status: DC
Start: 2020-09-23 — End: 2021-12-18

## 2020-09-23 MED ORDER — BUSPIRONE HCL 7.5 MG PO TABS: 7.5000 mg | ORAL_TABLET | Freq: Three times a day (TID) | ORAL | 5 refills | Status: DC

## 2020-09-23 MED ORDER — GABAPENTIN 300 MG PO CAPS: ORAL_CAPSULE | ORAL | 3 refills | Status: DC

## 2020-09-23 NOTE — Progress Notes (Signed)
Established Patient Office Visit  Subjective:  Patient ID: Adam [name], male    DOB: 1950-11-29  Age: 69 y.o. MRN: [id number]  CC:  Chief Complaint  Patient presents with  . Follow-up    HPI Adam [name] presents for medical follow-up.  He was seen recently after a lengthy hospitalization and also a lengthy stay at a skilled nursing facility.  Refer to that note for details.  He had multiple falls leading up to his admission.  He could not ambulate at the time of his admission.  He had extensive physical therapy and is getting stronger and ambulating much better at this time.  He has history of alcohol abuse and apparently was abusing alcohol fairly heavily when he fell last spring with head injury.  He also had used lorazepam at high dosage when he came to [place].  We have scaled back his dosage.  He continues to take low-dose lorazepam in the facility but of course was not drinking then.  He has resumed drinking he states 2 drinks per day but wife has had concerns about him still getting intoxicated.  We have strongly advised him to stop drinking completely.  His motivation is low.  Multiple chronic problems include history of CAD, COPD, type 2 diabetes, atrial fibrillation, hypertension, hyperlipidemia.  He has some confusion regarding diabetes medications.  Is on oral medications including Jardiance, Januvia, and Metformin.  Apparently only taking Toujeo and currently taking this as 20 units 3 times daily.  He had NovoLog insulin on his list but is not taking this.  Currently not checking blood sugars at home.  No recent A1c on record. No recent hypoglycemic symptoms.   Other medications reviewed.  He needs refills of several including gabapentin, potassium, and Metformin.  Denies any falls since he has gotten home.  He has had some physical therapy still at home  Past Medical History:  Diagnosis Date  . Allergy   . Anxiety    history of PTSD following CABG  . Ascending aortic  aneurysm ([place]) 01/31/2018   43 x 42 mm, pt unaware  . Asthma   . Cardiomegaly 10/17/2017  . Colitis- colonoscopy 2014 07/13/2015  . COPD (chronic obstructive pulmonary disease) ([place])   . Coronary artery disease    x 6  . Depression   . Diabetes mellitus without complication ([place])   . Family history of polyps in the colon   . Finger dislocation    Left pinkie  . GERD (gastroesophageal reflux disease)   . Gout   . H/O atrial fibrillation without current medication    following CABG with no documented episodes since then.  . Heart palpitations   . Hx of adenomatous colonic polyps 08/12/2010  . Hyperlipidemia   . Hypertension   . OA (osteoarthritis)   . OSA (obstructive sleep apnea)    Mild, has not received CPAP yet  . Prediabetes   . RLS (restless legs syndrome)   . 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 ANKLE FRACTURE SURGERY Right 1991  . APPENDECTOMY    . ATRIAL FIBRILLATION ABLATION N/A 03/31/2018   Procedure: ATRIAL FIBRILLATION ABLATION;  Surgeon: [name], MD;  Location: [facility];  Service: Cardiovascular;  Laterality: N/A;  . CARDIAC ELECTROPHYSIOLOGY MAPPING AND ABLATION    . COLONOSCOPY W/ BIOPSIES  2017   x7  . CORONARY ANGIOPLASTY WITH STENT PLACEMENT    .  CORONARY ARTERY BYPASS GRAFT    . FINGER SURGERY  04/2018   Small finger left hand  . implantable loop recorder placement  07/02/2019   Medtronic Reveal Ursa model [id number] ([id number]) implanted in office by Dr Rayann Heman  . TEE WITHOUT CARDIOVERSION N/A 03/30/2018   Procedure: TRANSESOPHAGEAL ECHOCARDIOGRAM (TEE);  Surgeon: [name], MD;  Location: [facility];  Service: Cardiovascular;  Laterality: N/A;  . TOTAL HIP ARTHROPLASTY Left   . TOTAL HIP ARTHROPLASTY Right 09/12/2018   Procedure: TOTAL HIP ARTHROPLASTY ANTERIOR APPROACH;  Surgeon: [name], MD;  Location: [facility];  Service: Orthopedics;  Laterality: Right;  70  mins    Family History  Problem Relation Age of Onset  . Colon cancer Mother   . Cancer Father        UNKNOWN TYPE  . Heart disease Paternal Grandmother   . Heart disease Paternal Grandfather     Social History   Socioeconomic History  . Marital status: Married    Spouse name: Not on file  . Number of children: 0  . Years of education: Not on file  . Highest education level: Not on file  Occupational History  . Occupation: retired  Tobacco Use  . Smoking status: Former Smoker    Packs/day: 1.00    Years: 7.00    Pack years: 7.00    Types: Cigarettes    Quit date: 10/04/1977    Years since quitting: 43.0  . Smokeless tobacco: Never Used  . Tobacco comment: never smoked over 1 pack   Vaping Use  . Vaping Use: Never used  Substance and Sexual Activity  . Alcohol use: Yes    Alcohol/week: 3.0 - 4.0 standard drinks    Types: 1 Glasses of wine, 2 - 3 Shots of liquor per week    Comment: DAILY  . Drug use: No    Comment: Smoked Marijuana back in 1970s  . Sexual activity: Yes  Other Topics Concern  . 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 Difficulty of Paying Living Expenses: Not very hard  Food Insecurity: No Food Insecurity  . Worried About Charity fundraiser in the Last Year: Never true  . Ran Out of Food in the Last Year: Never true  Transportation Needs: No Transportation Needs  . Lack of Transportation (Medical): No  . Lack of Transportation (Non-Medical): No  Physical Activity: Inactive  . Days of Exercise per Week: 0 days  . Minutes of Exercise per Session: 0 min  Stress: Stress Concern Present  . Feeling of Stress : Very much  Social Connections: Moderately Isolated  .  Frequency of Communication with Friends and Family: More than three times a week  . Frequency of Social Gatherings with Friends and Family: Once a week  . Attends Religious Services: Never  . Active Member of Clubs or Organizations: No  . Attends Archivist Meetings: Never  . Marital Status: Married  Human resources officer Violence: Not on file    Outpatient Medications Prior to Visit  Medication Sig Dispense Refill  . acetaminophen (TYLENOL) 325 MG tablet Take 325 mg by mouth every 8 (eight) hours as needed.    Marland Kitchen  allopurinol (ZYLOPRIM) 100 MG tablet Take 100 mg by mouth daily.    Marland Kitchen apixaban (ELIQUIS) 5 MG TABS tablet Take 1 tablet (5 mg total) by mouth 2 (two) times daily. 180 tablet 1  . B COMPLEX-C-FOLIC ACID PO Take by mouth daily.    Marland Kitchen diltiazem (CARDIZEM CD) 360 MG 24 hr capsule Take 1 capsule (360 mg total) by mouth daily. 90 capsule 3  . divalproex (DEPAKOTE SPRINKLE) 125 MG capsule Take 1 capsule (125 mg total) by mouth 3 (three) times daily. 270 capsule 1  . dofetilide (TIKOSYN) 125 MCG capsule Take 125 mcg by mouth 2 (two) times daily.    . DULoxetine (CYMBALTA) 60 MG capsule Take 1 capsule (60 mg total) by mouth daily. 30 capsule 3  . empagliflozin (JARDIANCE) 25 MG TABS tablet Take 1 tablet (25 mg total) by mouth daily before breakfast. 90 tablet 1  . famotidine (PEPCID) 20 MG tablet Take 20 mg by mouth daily.    . Fluticasone-Umeclidin-Vilant (TRELEGY ELLIPTA) 100-62.5-25 MCG/INH AEPB Inhale 1 puff into the lungs daily. 60 each 3  . gabapentin (NEURONTIN) 300 MG capsule Take 1 capsule (300 mg total) by mouth at bedtime. Take at night as needed for restless leg symptoms (Patient taking differently: Take 300 mg by mouth at bedtime. Patient says he takes up to 4 tablets as needed) 1 capsule 0  . glucose blood (ONETOUCH VERIO) test strip Test once daily.  Dx E11.9 100 each 3  . Insulin Pen Needle (NOVOFINE PEN NEEDLE) 32G X 6 MM MISC Use 1-4 times daily as needed for insulin 100  each 3  . JANUVIA 100 MG tablet TAKE 1 TABLET(100 MG) BY MOUTH DAILY 90 tablet 1  . Lidocaine HCl 4 % CREA Apply topically.    Marland Kitchen losartan (COZAAR) 100 MG tablet Take 1 tablet (100 mg total) by mouth daily. 90 tablet 1  . Magnesium Oxide 400 MG CAPS Take 1 capsule (400 mg total) by mouth daily. 90 capsule 1  . metFORMIN (GLUCOPHAGE-XR) 500 MG 24 hr tablet Take 500 mg by mouth in the morning and at bedtime.    . montelukast (SINGULAIR) 10 MG tablet Take 10 mg by mouth at bedtime.    . Multiple Vitamin (MULTIVITAMIN WITH MINERALS) TABS tablet Take 1 tablet by mouth daily.    . potassium chloride SA (KLOR-CON) 20 MEQ tablet Take 1 tablet (20 mEq total) by mouth daily.    . PSYLLIUM HUSK PO Take by mouth. 2 cap twice daily    . rosuvastatin (CRESTOR) 40 MG tablet Take 1 tablet (40 mg total) by mouth daily. 90 tablet 3  . saccharomyces boulardii (FLORASTOR) 250 MG capsule Take 250 mg by mouth 2 (two) times daily.    Marland Kitchen thiamine (VITAMIN B-1) 100 MG tablet Take 100 mg by mouth daily.    Marland Kitchen VASCEPA 1 g capsule TAKE 2 CAPSULES(2 GRAMS) BY MOUTH TWICE DAILY 180 capsule 1  . insulin aspart (NOVOLOG) 100 UNIT/ML injection Inject into the skin 3 (three) times daily before meals.    . insulin glargine (LANTUS SOLOSTAR) 100 UNIT/ML Solostar Pen Inject 18 Units into the skin daily. 15 mL 1  . LORazepam (ATIVAN) 0.5 MG tablet Take 0.5 mg by mouth at bedtime as needed for anxiety.    . insulin glargine, 2 Unit Dial, (TOUJEO MAX SOLOSTAR) 300 UNIT/ML Solostar Pen Inject 60 Units into the skin daily.     No facility-administered medications prior to visit.    Allergies  Allergen Reactions  .  Xarelto [Rivaroxaban] Other (See Comments)    Patient stated he "ended up in the hospital with a rectal bleed"  . Amoxicillin Rash and Other (See Comments)    * SEVERE RASH IN GROIN AREA Has patient had a PCN reaction causing immediate rash, facial/tongue/throat swelling, SOB or lightheadedness with hypotension: no Has  patient had a PCN reaction causing severe rash involving mucus membranes or skin necrosis: no Has patient had a PCN reaction that required hospitalization: no Has patient had a PCN reaction occurring within the last 10 years: yes If all of the above answers are "NO", then may proceed with Cephalosporin use.   . Augmentin [Amoxicillin-Pot Clavulanate] Rash and Other (See Comments)    * SEVERE RASH IN GROIN AREA  . Azithromycin Rash and Other (See Comments)    * SEVERE RASH IN GROIN AREA  . Clindamycin/Lincomycin Rash  . Keflex [Cephalexin] Rash    ROS Review of Systems  Constitutional: Negative for chills and fever.  Respiratory: Negative for shortness of breath.   Cardiovascular: Negative for chest pain.  Genitourinary: Negative for dysuria.  Psychiatric/Behavioral: Negative for confusion.      Objective:    Physical Exam Vitals reviewed.  Constitutional:      Appearance: Normal appearance.  Cardiovascular:     Rate and Rhythm: Normal rate.  Pulmonary:     Effort: Pulmonary effort is normal.     Breath sounds: Normal breath sounds.  Musculoskeletal:     Right lower leg: No edema.     Left lower leg: No edema.  Neurological:     Mental Status: He is alert.     There were no vitals taken for this visit. Wt Readings from Last 3 Encounters:  09/09/20 215 lb (97.5 kg)  08/22/20 219 lb 9.6 oz (99.6 kg)  06/18/20 219 lb 9.6 oz (99.6 kg)     Health Maintenance Due  Topic Date Due  . COLONOSCOPY  11/19/2018  . TETANUS/TDAP  10/07/2019  . INFLUENZA VACCINE  05/04/2020  . COVID-19 Vaccine (3 - Booster for Pfizer series) 07/09/2020  . HEMOGLOBIN A1C  09/01/2020  . FOOT EXAM  09/23/2020    There are no preventive care reminders to display for this patient.  Lab Results  Component Value Date   TSH 3.137 02/08/2020   Lab Results  Component Value Date   WBC 7.6 06/05/2020   HGB 10.5 (L) 06/05/2020   HCT 32.0 (L) 06/05/2020   MCV 97 06/05/2020   PLT 203  06/05/2020   Lab Results  Component Value Date   NA 137 08/22/2020   K 3.3 (L) 08/22/2020   CO2 24 08/22/2020   GLUCOSE 270 (H) 08/22/2020   BUN 9 08/22/2020   CREATININE 0.64 (L) 08/22/2020   BILITOT 0.9 02/20/2020   ALKPHOS 69 02/20/2020   AST 32 02/20/2020   ALT 34 02/20/2020   PROT 7.2 02/20/2020   ALBUMIN 3.3 (L) 03/03/2020   CALCIUM 9.3 08/22/2020   ANIONGAP 8 03/03/2020   GFR 75.74 12/03/2019   Lab Results  Component Value Date   CHOL 108 02/21/2020   Lab Results  Component Value Date   HDL 20 (L) 02/21/2020   Lab Results  Component Value Date   LDLCALC 55 02/21/2020   Lab Results  Component Value Date   TRIG 163 (H) 02/21/2020   Lab Results  Component Value Date   CHOLHDL 5.4 02/21/2020   Lab Results  Component Value Date   HGBA1C 6.9 (H) 03/01/2020  Assessment & Plan:   #1 type 2 diabetes.  Unknown current level of control.  Patient currently on several oral medications and Toujeo 60 units daily.  He has been splitting up dose of Toujeo and we recommend take this all at 1 time  -Recheck A1c -Continue yearly eye exams -We strongly advised him to get home glucose monitor.  If A1c not controlled consider adding sliding scale NovoLog at meals. -refilled Metformin and needs BMP (confirm GFR > 30).  #2 history of restless leg syndrome.  Has been taking gabapentin at night for that  -Refill gabapentin  #3 hypertension currently well controlled -Continue current blood pressure medications -Check basic metabolic panel -refilled his potassium  #4 dyslipidemia.  Patient on Crestor.  Goal LDL less than 70  -Recheck lipid and hepatic panel  #5 history of chronic anxiety.  Patient has been on lorazepam for some time.  We explained our reluctance to take this both in terms of abuse potential and increased fall risk with his alcohol history.  We did agree to trying BuSpar 7.5 mg twice daily and will give some feedback at follow-up in 3 months  Meds  ordered this encounter  Medications  . busPIRone (BUSPAR) 7.5 MG tablet    Sig: Take 1 tablet (7.5 mg total) by mouth 3 (three) times daily.    Dispense:  60 tablet    Refill:  5    Follow-up: Return in about 3 months (around 12/22/2020).    Carolann Littler, MD

## 2020-09-23 NOTE — Patient Instructions (Signed)
Take the Toujeo insulin at one dose  Get new glucose monitor.

## 2020-09-24 ENCOUNTER — Telehealth: Payer: Self-pay | Admitting: *Deleted

## 2020-09-24 ENCOUNTER — Encounter: Payer: Self-pay | Admitting: Family Medicine

## 2020-09-24 LAB — CBC WITH DIFFERENTIAL/PLATELET
Absolute Monocytes: 1225 cells/uL — ABNORMAL HIGH (ref 200–950)
Basophils Absolute: 40 cells/uL (ref 0–200)
Basophils Relative: 0.5 %
Eosinophils Absolute: 174 cells/uL (ref 15–500)
Eosinophils Relative: 2.2 %
HCT: 40.4 % (ref 38.5–50.0)
Hemoglobin: 14.1 g/dL (ref 13.2–17.1)
Lymphs Abs: 1564 cells/uL (ref 850–3900)
MCH: 32.4 pg (ref 27.0–33.0)
MCHC: 34.9 g/dL (ref 32.0–36.0)
MCV: 92.9 fL (ref 80.0–100.0)
MPV: 10.9 fL (ref 7.5–12.5)
Monocytes Relative: 15.5 %
Neutro Abs: 4898 cells/uL (ref 1500–7800)
Neutrophils Relative %: 62 %
Platelets: 196 10*3/uL (ref 140–400)
RBC: 4.35 10*6/uL (ref 4.20–5.80)
RDW: 13.6 % (ref 11.0–15.0)
Total Lymphocyte: 19.8 %
WBC: 7.9 10*3/uL (ref 3.8–10.8)

## 2020-09-24 LAB — HEPATIC FUNCTION PANEL
AG Ratio: 1.5 (calc) (ref 1.0–2.5)
ALT: 16 U/L (ref 9–46)
AST: 20 U/L (ref 10–35)
Albumin: 4.6 g/dL (ref 3.6–5.1)
Alkaline phosphatase (APISO): 55 U/L (ref 35–144)
Bilirubin, Direct: 0.1 mg/dL (ref 0.0–0.2)
Globulin: 3 g/dL (calc) (ref 1.9–3.7)
Indirect Bilirubin: 0.6 mg/dL (calc) (ref 0.2–1.2)
Total Bilirubin: 0.7 mg/dL (ref 0.2–1.2)
Total Protein: 7.6 g/dL (ref 6.1–8.1)

## 2020-09-24 LAB — HEMOGLOBIN A1C
Hgb A1c MFr Bld: 10.1 % of total Hgb — ABNORMAL HIGH (ref <5.7)
Mean Plasma Glucose: 243 mg/dL
eAG (mmol/L): 13.5 mmol/L

## 2020-09-24 LAB — LIPID PANEL
Cholesterol: 137 mg/dL (ref <200)
HDL: 40 mg/dL (ref >=40)
LDL Cholesterol (Calc): 64 mg/dL (calc)
Non-HDL Cholesterol (Calc): 97 mg/dL (calc) (ref <130)
Total CHOL/HDL Ratio: 3.4 (calc) (ref <5.0)
Triglycerides: 280 mg/dL — ABNORMAL HIGH (ref <150)

## 2020-09-24 LAB — BASIC METABOLIC PANEL
BUN: 12 mg/dL (ref 7–25)
CO2: 27 mmol/L (ref 20–32)
Calcium: 10.9 mg/dL — ABNORMAL HIGH (ref 8.6–10.3)
Chloride: 100 mmol/L (ref 98–110)
Creat: 0.93 mg/dL (ref 0.70–1.18)
Glucose, Bld: 175 mg/dL — ABNORMAL HIGH (ref 65–99)
Potassium: 3.6 mmol/L (ref 3.5–5.3)
Sodium: 138 mmol/L (ref 135–146)

## 2020-09-24 NOTE — Telephone Encounter (Signed)
   Primary Cardiologist: [name], MD  Chart reviewed as part of pre-operative protocol coverage. Given past medical history and time since last visit, based on ACC/AHA guidelines, Adam [name] would be at acceptable risk for the planned procedure without further cardiovascular testing.   Patient with diagnosis of afib on Eliquis for anticoagulation.    Procedure: 3 TEETH TO BE EXTRACTED Date of procedure: TBD    CHA2DS2-VASc Score = 4  This indicates a 4.8% annual risk of stroke. The patient's score is based upon: CHF History: No HTN History: Yes Diabetes History: Yes Stroke History: No Vascular Disease History: Yes Age Score: 1 Gender Score: 0      CrCl 86.5 ml/min  Patient does NOT require pre-op antibiotics for dental procedure.  Per office protocol, patient can hold Eliquis for 1 day prior to procedure.  I will route this recommendation to the requesting party via Epic fax function and remove from pre-op pool.  Please call with questions.  Jossie Ng. Nithin Demeo NP-C    09/24/2020, 1:06 PM [place] [address] Office [phone] Fax [phone]

## 2020-09-24 NOTE — Telephone Encounter (Signed)
Patient with diagnosis of afib on Eliquis for anticoagulation.    Procedure: 3 TEETH TO BE EXTRACTED  Date of procedure: TBD    CHA2DS2-VASc Score = 4  This indicates a 4.8% annual risk of stroke. The patient's score is based upon: CHF History: No HTN History: Yes Diabetes History: Yes Stroke History: No Vascular Disease History: Yes Age Score: 1 Gender Score: 0      CrCl 86.5 ml/min  Patient does NOT require pre-op antibiotics for dental procedure.  Per office protocol, patient can hold Eliquis for 1 day prior to procedure.

## 2020-09-24 NOTE — Telephone Encounter (Signed)
   [facility] Pre-operative Risk Assessment    HEARTCARE STAFF: - Please ensure there is not already an duplicate clearance open for this procedure. - Under Visit Info/Reason for Call, type in Other and utilize the format Clearance MM/DD/YY or Clearance TBD. Do not use dashes or single digits. - If request is for dental extraction, please clarify the # of teeth to be extracted.  Request for surgical clearance:  1. What type of surgery is being performed? 3 TEETH TO BE EXTRACTED   2. When is this surgery scheduled? TBD   3. What type of clearance is required (medical clearance vs. Pharmacy clearance to hold med vs. Both)? BOTH  4. Are there any medications that need to be held prior to surgery and how long? ELIQUIS   5. Practice name and name of physician performing surgery? [facility], [place]; DR. Romie Minus  6. What is the office phone number? [phone]   7.   What is the office fax number? [phone]  8.   Anesthesia type (None, local, MAC, general) ? IV SEDATION ( INCLUDING: VERSED, ATROPINE, FENTANYL, DECADRON AND SODIUM BREVITAL   Julaine Hua 09/24/2020, 12:39 PM  _________________________________________________________________   (provider comments below)

## 2020-09-25 ENCOUNTER — Ambulatory Visit (INDEPENDENT_AMBULATORY_CARE_PROVIDER_SITE_OTHER): Payer: Medicare Other

## 2020-09-25 DIAGNOSIS — I480 Paroxysmal atrial fibrillation: Secondary | ICD-10-CM

## 2020-09-29 ENCOUNTER — Other Ambulatory Visit: Payer: Self-pay

## 2020-09-29 ENCOUNTER — Telehealth: Payer: Self-pay

## 2020-09-29 DIAGNOSIS — E1165 Type 2 diabetes mellitus with hyperglycemia: Secondary | ICD-10-CM

## 2020-09-29 MED ORDER — ONETOUCH ULTRASOFT LANCETS MISC: 12 refills | Status: DC

## 2020-09-29 MED ORDER — ONETOUCH VERIO VI STRP: ORAL_STRIP | 12 refills | Status: DC

## 2020-09-29 MED ORDER — ONETOUCH VERIO W/DEVICE KIT: PACK | 0 refills | Status: DC

## 2020-09-29 NOTE — Telephone Encounter (Signed)
Patient aware of results and recommendations.  New glucometer sent to pharmacy and follow up appointment scheduled.

## 2020-09-29 NOTE — Telephone Encounter (Signed)
-----   Message from Kristian Covey, MD sent at 09/25/2020  8:55 AM EST ----- Potassium now normal.   Kidney function normal.  His corrected calcium is 10.7 which is slightly elevated and of ? Significance.  Lipids stable.  CBC normal.  Liver normal.  A1C is out of control at 10.1%.    suggest: -he needs to get home monitor and monitor blood sugars at least twice daily and record and bring for follow up. Set up one month office follow up for 30 minutes.   Will repeat Calcium then and probably start Novolog meal time insulin then.

## 2020-10-08 ENCOUNTER — Encounter: Payer: Self-pay | Admitting: Family Medicine

## 2020-10-08 NOTE — Progress Notes (Signed)
[facility] Summary Report / Loop Recorder 

## 2020-10-13 NOTE — Progress Notes (Signed)
Cardiology Office Note   Date:  10/15/2020   ID:  CAMP GOPAL, DOB 09/08/1951, MRN [id number]  PCP:  [name], MD  Cardiologist:   Matisha Termine [place], MD   Chief Complaint  Patient presents with   Coronary Artery Disease   Atrial Fibrillation      History of Present Illness: Adam [name] is a 70 y.o. male who presents for follow up CAD. He is a former patient of [name]. He is followed by Dr. Rayann Heman in the EP clinic. He has a past medical history which includesCABG in 2011 done in [place], HTN, HLD, DM, Atrial fibrillation, and OSA- followed by Dr Radford Pax. He underwent catheter ablation for atrial fibrillation by Dr. Rayann Heman 03/31/18.He is onTikosyn and Xarelto.Event monitor in June 2020 showed no Afib. Because of intermittent palpitations Dr Rayann Heman placed an ILR in September.  He is followed by Dr. Radford Pax in sleep clinic, but unable to tolerate CPAP. He reports that his sleep apnea is mild.  He had an echo in January of 2019 that showed normal LVEF and no significant valvular abnormalities. Stress test in 2013 showed no ischemia.He had an abnormal chest CT done 01/2018 that showed an ascending aortic aneurysm, measuring 43 x 42 mm. Repeat CT angio in May 2020 showed no change in size.   The patient reports that prior to CABG he had 25 stents placed over a 10-12 year period.  These procedures and his bypass were done in [place], [place]. He notes he previously exercised regularly but over the past 2 years hasn't been able to do much. 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   He has been followed by EPS noted Afib burden of 5.7% by interrogation in November. He was admitted in May 2021 with acute encephalopathy felt to be due to alcohol withdrawal and Wernicke's encephalopathy.  He was in Afib during this hospitalization initially w/RVR though rate controlled by discharge. He was later resumed on Tikosyn. He was at [facility] for Rehab for 7 months.  He did get started on Eliquis when last seen by Dr Rayann Heman. He denies any dyspnea. No  chest pain. CT chest showed stable aneurysm size in September.   Since return home he states he has 2 drinks a night. Is aware that this contributes to his encephalopathy but states he "doesn't have a lot to look forward to".    Past Medical History:  Diagnosis Date   Allergy    Anxiety    history of PTSD following CABG   Ascending aortic aneurysm ([place]) 01/31/2018   43 x 42 mm, pt unaware   Asthma    Cardiomegaly 10/17/2017   Colitis- colonoscopy 2014 07/13/2015   COPD (chronic obstructive pulmonary disease) ([place])    Coronary artery disease    x 6   Depression    Diabetes mellitus without complication ([place])    Family history of polyps in the colon    Finger dislocation    Left pinkie   GERD (gastroesophageal reflux disease)    Gout    H/O atrial fibrillation without current medication    following CABG with no documented episodes since then.   Heart palpitations    Hx of adenomatous colonic polyps 08/12/2010   Hyperlipidemia    Hypertension    OA (osteoarthritis)    OSA (obstructive sleep apnea)    Mild, has not received CPAP yet   Prediabetes    RLS (restless legs syndrome)  Squamous cell carcinoma of scalp 2016   Moh's    Past Surgical History:  Procedure Laterality Date   ANKLE FRACTURE SURGERY Right 1991   APPENDECTOMY     ATRIAL FIBRILLATION ABLATION N/A 03/31/2018   Procedure: ATRIAL FIBRILLATION ABLATION;  Surgeon: [name], MD;  Location: [facility];  Service: Cardiovascular;  Laterality: N/A;   CARDIAC ELECTROPHYSIOLOGY MAPPING AND ABLATION     COLONOSCOPY W/ BIOPSIES  2017   x7   CORONARY ANGIOPLASTY WITH STENT PLACEMENT     CORONARY ARTERY BYPASS GRAFT     FINGER SURGERY  04/2018   Small finger left hand   implantable loop recorder placement  07/02/2019   Medtronic Reveal [place] model LNQ11 (SN [id number]) implanted in office  by Dr Rayann Heman   TEE WITHOUT CARDIOVERSION N/A 03/30/2018   Procedure: TRANSESOPHAGEAL ECHOCARDIOGRAM (TEE);  Surgeon: [name], MD;  Location: [facility];  Service: Cardiovascular;  Laterality: N/A;   TOTAL HIP ARTHROPLASTY Left    TOTAL HIP ARTHROPLASTY Right 09/12/2018   Procedure: TOTAL HIP ARTHROPLASTY ANTERIOR APPROACH;  Surgeon: [name], MD;  Location: [facility];  Service: Orthopedics;  Laterality: Right;  70 mins     Current Outpatient Medications  Medication Sig Dispense Refill   acetaminophen (TYLENOL) 325 MG tablet Take 325 mg by mouth every 8 (eight) hours as needed.     allopurinol (ZYLOPRIM) 100 MG tablet Take 100 mg by mouth daily.     apixaban (ELIQUIS) 5 MG TABS tablet Take 1 tablet (5 mg total) by mouth 2 (two) times daily. 180 tablet 1   B COMPLEX-C-FOLIC ACID PO Take by mouth daily.     Blood Glucose Monitoring Suppl (ONETOUCH VERIO) w/Device KIT Use 1-4 times daily as needed/directed DX E11.9 1 kit 0   busPIRone (BUSPAR) 7.5 MG tablet Take 1 tablet (7.5 mg total) by mouth 3 (three) times daily. 60 tablet 5   diltiazem (CARDIZEM CD) 360 MG 24 hr capsule Take 1 capsule (360 mg total) by mouth daily. 90 capsule 3   divalproex (DEPAKOTE SPRINKLE) 125 MG capsule Take 1 capsule (125 mg total) by mouth 3 (three) times daily. 270 capsule 1   dofetilide (TIKOSYN) 125 MCG capsule Take 125 mcg by mouth 2 (two) times daily.     DULoxetine (CYMBALTA) 60 MG capsule Take 1 capsule (60 mg total) by mouth daily. 30 capsule 3   empagliflozin (JARDIANCE) 25 MG TABS tablet Take 1 tablet (25 mg total) by mouth daily before breakfast. 90 tablet 1   famotidine (PEPCID) 20 MG tablet Take 20 mg by mouth daily.     Fluticasone-Umeclidin-Vilant (TRELEGY ELLIPTA) 100-62.5-25 MCG/INH AEPB Inhale 1 puff into the lungs daily. 60 each 3   gabapentin (NEURONTIN) 300 MG capsule Take 2 capsules at night as needed for restless leg symptoms 180 capsule 3   insulin glargine, 2 Unit  Dial, (TOUJEO MAX SOLOSTAR) 300 UNIT/ML Solostar Pen Inject 60 Units into the skin daily.     Insulin Pen Needle (NOVOFINE PEN NEEDLE) 32G X 6 MM MISC Use 1-4 times daily as needed for insulin 100 each 3   JANUVIA 100 MG tablet TAKE 1 TABLET(100 MG) BY MOUTH DAILY 90 tablet 1   Lancets (ONETOUCH ULTRASOFT) lancets Use 1-4 times daily as needed/directed  DX E11.9 200 each 12   Lidocaine HCl 4 % CREA Apply topically.     losartan (COZAAR) 100 MG tablet Take 1 tablet (100 mg total) by mouth daily. 90 tablet 1   Magnesium Oxide  400 MG CAPS Take 1 capsule (400 mg total) by mouth daily. 90 capsule 1   metFORMIN (GLUCOPHAGE-XR) 500 MG 24 hr tablet Take 1 tablet (500 mg total) by mouth in the morning and at bedtime. 180 tablet 3   montelukast (SINGULAIR) 10 MG tablet Take 10 mg by mouth at bedtime.     Multiple Vitamin (MULTIVITAMIN WITH MINERALS) TABS tablet Take 1 tablet by mouth daily.     ONETOUCH VERIO test strip Use 1-4 times daily as directed/needed   DX E11.9 200 each 12   potassium chloride SA (KLOR-CON) 20 MEQ tablet Take 2 tablets once daily 180 tablet 3   PSYLLIUM HUSK PO Take by mouth. 2 cap twice daily     rosuvastatin (CRESTOR) 40 MG tablet Take 1 tablet (40 mg total) by mouth daily. 90 tablet 3   saccharomyces boulardii (FLORASTOR) 250 MG capsule Take 250 mg by mouth 2 (two) times daily.     thiamine (VITAMIN B-1) 100 MG tablet Take 100 mg by mouth daily.     VASCEPA 1 g capsule TAKE 2 CAPSULES(2 GRAMS) BY MOUTH TWICE DAILY 180 capsule 1   No current facility-administered medications for this visit.    Allergies:   Xarelto [rivaroxaban], Amoxicillin, Augmentin [amoxicillin-pot clavulanate], Azithromycin, Clindamycin/lincomycin, and Keflex [cephalexin]    Social History:  The patient  reports that he quit smoking about 43 years ago. His smoking use included cigarettes. He has a 7.00 pack-year smoking history. He has never used smokeless tobacco. He reports current  alcohol use of about 3.0 - 4.0 standard drinks of alcohol per week. He reports that he does not use drugs.   Family History:  The patient's family history includes Cancer in his father; Colon cancer in his mother; Heart disease in his paternal grandfather and paternal grandmother.    ROS:  Please see the history of present illness.   Otherwise, review of systems are positive for none.   All other systems are reviewed and negative.    PHYSICAL EXAM: VS:  BP 131/73    Pulse 93    Ht _0  (1.778 m)    Wt 221 lb 12.8 oz (100.6 kg)    SpO2 100%    BMI 31.82 kg/m  , BMI Body mass index is 31.82 kg/m. GEN: Well nourished, obese, in no acute distress  HEENT: normal  Neck: no JVD, carotid bruits, or masses Cardiac: RRR; no murmurs, rubs, or gallops,no edema  Respiratory:  clear to auscultation bilaterally, normal work of breathing GI: soft, nontender, nondistended, + BS MS: no deformity or atrophy  Skin: warm and dry, no rash Neuro:  Strength and sensation are intact Psych: euthymic mood, full affect   EKG:  EKG is not ordered today. The ekg ordered today demonstrates N/A   Recent Labs: 12/03/2019: Pro B Natriuretic peptide (BNP) 266.0 02/08/2020: TSH 3.137 08/22/2020: Magnesium 1.7 09/23/2020: ALT 16; BUN 12; Creat 0.93; Hemoglobin 14.1; Platelets 196; Potassium 3.6; Sodium 138    Lipid Panel    Component Value Date/Time   CHOL 137 09/23/2020 1216   CHOL 142 08/08/2019 1058   TRIG 280 (H) 09/23/2020 1216   HDL 40 09/23/2020 1216   HDL 37 (L) 08/08/2019 1058   CHOLHDL 3.4 09/23/2020 1216   VLDL 33 02/21/2020 0334   LDLCALC 64 09/23/2020 1216   LDLDIRECT 124.0 06/23/2018 1118      Wt Readings from Last 3 Encounters:  10/15/20 221 lb 12.8 oz (100.6 kg)  09/09/20 215 lb (97.5 kg)  08/22/20 219 lb 9.6 oz (99.6 kg)      Other studies Reviewed: Additional studies/ records that were reviewed today include:  Echo 03/30/18: Study Conclusions  - Left ventricle: The cavity  size was normal. Wall thickness was   normal. Systolic function was normal. The estimated ejection   fraction was in the range of 55% to 60%. Wall motion was normal;   there were no regional wall motion abnormalities. - Mitral valve: Mildly calcified annulus. - Left atrium: No evidence of thrombus in the atrial cavity or   appendage. No spontaneous echo contrast was observed. The   appendage was morphologically a left appendage, multilobulated,   and of normal size. Emptying velocity was mildly reduced. - Right atrium: No evidence of thrombus in the atrial cavity or   appendage. - Atrial septum: There was a very small patent foramen ovale.   Doppler showed a trivial left-to-right shunt, in the baseline   state.  Event monitor 03/07/19: Study Highlights  Sinus rhythm Rare premature ventricular contractions Nonsustained ventricular tachycardia No sustained arrhythmias No atrial fibrillation Baseline artifact limits interpretation at times.   Echo 02/11/20: IMPRESSIONS    1. Technically difficult study with limited views and patient  uncooperative. Grossly normal LV systolic function. Left ventricular  ejection fraction, by estimation, is 60 to 65%. Image quality is not  sufficient to assess for regional wall motiuon  abnormalities. There is mild left ventricular hypertrophy. Left  ventricular diastolic parameters are indeterminate.  2. Right ventricle is pooly visualized but grossly normal size with  mildly reduced systolic function.  3. The mitral valve is normal in structure. Mild mitral valve  regurgitation.  4. The aortic valve was not well visualized. Aortic valve regurgitation  is not visualized. No aortic stenosis is present.   CT ANGIOGRAPHY CHEST WITH CONTRAST  TECHNIQUE: Multidetector CT imaging of the chest was performed using the standard protocol during bolus administration of intravenous contrast. Multiplanar CT image reconstructions and MIPs were obtained  to evaluate the vascular anatomy.  CONTRAST:  47m OMNIPAQUE IOHEXOL 350 MG/ML SOLN  COMPARISON:  None.  FINDINGS: Cardiovascular: Stable mild aneurysmal disease of the proximal thoracic aorta. The aortic root measures approximately 4.0-4.1 cm at the level of the sinuses of Valsalva. The ascending thoracic aorta measures 4.2 cm in greatest estimated diameter. The proximal arch measures 3.5 cm and the distal arch 2.6 cm. The descending thoracic aorta measures 2.6 cm. No evidence of aortic dissection. Stable atherosclerosis of the arch and descending thoracic aorta.  Stable calcified plaque at the origins great vessels without significant stenosis. The heart size is stable and within normal limits. Evidence of prior CABG. No pericardial fluid. Central pulmonary arteries are normal in caliber.  Mediastinum/Nodes: No enlarged mediastinal, hilar, or axillary lymph nodes. Thyroid gland, trachea, and esophagus demonstrate no significant findings.  Lungs/Pleura: There is no evidence of pulmonary edema, consolidation, pneumothorax, nodule or pleural fluid.  Upper Abdomen: Stable probable hepatic steatosis. There may be some tiny gallstones in the visualized gallbladder.  Musculoskeletal: No chest wall abnormality. No acute or significant osseous findings.  Review of the MIP images confirms the above findings.  IMPRESSION: 1. Stable mild aneurysmal disease of the proximal thoracic aorta with maximal estimated diameter of 4.2 cm. 2. Stable probable hepatic steatosis. 3. Possible tiny gallstones in the visualized gallbladder. 4. Aortic atherosclerosis.  Aortic aneurysm NOS (ICD10-I71.9).   Electronically Signed   By: GAletta EdouardM.D.   ASSESSMENT AND PLAN:  1. Thoracic aortic aneurysm: Incidental  finding on a chest CT done April 2019, measuring at 43 x 42 mm. Repeat scan in September 2021 showed no change.  He is asymptomatic. I think we can wait 2 years for  next CT.     2.  CAD: Status post CABG in 2011 done in [place]. Multiple stents prior to this. He has done well since CABG. No angina.   Last Myoview noted in our records was 2013 and was normal. Continue medical therapy with beta-blocker and statin.  He is not on aspirin due to Eliquis   3.  Paroxysmal atrial fibrillation: Followed by Dr. Rayann Heman  History of A fib ablation in June 2019. Now with ILR in place.  He is currently on Tikosyn, Cardizem and Eliquis. Relatively low Afib burden.  4.  Obstructive sleep apnea: Reports intolerance of CPAP.   5. DM: management per PCP. He is on [place]. Per primary care.   6. HTN. Currently well controlled.  7. Mixed HLD. LDL at goal on statin. Triglycerides elevated. On Vascepa. Continue Rx.   8. Encephalopathy. related to history of Etoh use. Recommend complete alcohol abstinence   Disposition:   FU with me in one year.  Signed, Esabella Stockinger Martinique, MD  10/15/2020 9:45 AM    [facility] [address], [place], [place], [address] Phone [phone], Fax [phone]

## 2020-10-15 ENCOUNTER — Encounter: Payer: Self-pay | Admitting: Cardiology

## 2020-10-15 ENCOUNTER — Other Ambulatory Visit: Payer: Self-pay

## 2020-10-15 ENCOUNTER — Ambulatory Visit: Payer: Medicare Other | Admitting: Cardiology

## 2020-10-15 VITALS — BP 131/73 | HR 93 | Ht 70.0 in | Wt 221.8 lb

## 2020-10-15 DIAGNOSIS — I480 Paroxysmal atrial fibrillation: Secondary | ICD-10-CM | POA: Diagnosis not present

## 2020-10-15 DIAGNOSIS — I25708 Atherosclerosis of coronary artery bypass graft(s), unspecified, with other forms of angina pectoris: Secondary | ICD-10-CM | POA: Diagnosis not present

## 2020-10-15 DIAGNOSIS — I10 Essential (primary) hypertension: Secondary | ICD-10-CM

## 2020-10-15 DIAGNOSIS — I712 Thoracic aortic aneurysm, without rupture, unspecified: Secondary | ICD-10-CM

## 2020-10-24 LAB — CUP PACEART REMOTE DEVICE CHECK
Date Time Interrogation Session: 20220121013708
Implantable Pulse Generator Implant Date: 20200928

## 2020-10-25 ENCOUNTER — Encounter: Payer: Self-pay | Admitting: Family Medicine

## 2020-10-27 ENCOUNTER — Ambulatory Visit (INDEPENDENT_AMBULATORY_CARE_PROVIDER_SITE_OTHER): Payer: Medicare Other

## 2020-10-27 DIAGNOSIS — I480 Paroxysmal atrial fibrillation: Secondary | ICD-10-CM | POA: Diagnosis not present

## 2020-11-03 DIAGNOSIS — L820 Inflamed seborrheic keratosis: Secondary | ICD-10-CM | POA: Diagnosis not present

## 2020-11-03 DIAGNOSIS — L570 Actinic keratosis: Secondary | ICD-10-CM | POA: Diagnosis not present

## 2020-11-03 DIAGNOSIS — B351 Tinea unguium: Secondary | ICD-10-CM | POA: Diagnosis not present

## 2020-11-03 DIAGNOSIS — D485 Neoplasm of uncertain behavior of skin: Secondary | ICD-10-CM | POA: Diagnosis not present

## 2020-11-06 ENCOUNTER — Other Ambulatory Visit: Payer: Self-pay | Admitting: Family Medicine

## 2020-11-06 NOTE — Progress Notes (Signed)
[facility] Summary Report / Loop Recorder 

## 2020-11-06 NOTE — Telephone Encounter (Signed)
Refill for 6 months. 

## 2020-11-11 MED ORDER — TRELEGY ELLIPTA 100-62.5-25 MCG/INH IN AEPB: 1.0000 | INHALATION_SPRAY | Freq: Every day | RESPIRATORY_TRACT | 3 refills | Status: DC

## 2020-11-16 ENCOUNTER — Other Ambulatory Visit: Payer: Self-pay | Admitting: Internal Medicine

## 2020-11-17 ENCOUNTER — Telehealth: Payer: Self-pay | Admitting: Family Medicine

## 2020-11-17 ENCOUNTER — Other Ambulatory Visit: Payer: Self-pay

## 2020-11-17 MED ORDER — DOFETILIDE 125 MCG PO CAPS: 125.0000 ug | ORAL_CAPSULE | Freq: Two times a day (BID) | ORAL | 3 refills | Status: DC

## 2020-11-17 NOTE — Telephone Encounter (Signed)
Pt's medication was sent to pt's pharmacy as requested. Confirmation received.  °

## 2020-11-17 NOTE — Telephone Encounter (Signed)
Left message for patient to call back and schedule Medicare Annual Wellness Visit (AWV) either virtually or in office. No detailed message left   Last AWV 11/20/19  please schedule at anytime with [facility] Nurse Health Advisor 1 or 2   This should be a 45 minute visit.

## 2020-11-27 ENCOUNTER — Ambulatory Visit (INDEPENDENT_AMBULATORY_CARE_PROVIDER_SITE_OTHER): Payer: Medicare Other

## 2020-11-27 DIAGNOSIS — I480 Paroxysmal atrial fibrillation: Secondary | ICD-10-CM | POA: Diagnosis not present

## 2020-11-27 LAB — CUP PACEART REMOTE DEVICE CHECK
Date Time Interrogation Session: 20220223030100
Implantable Pulse Generator Implant Date: 20200928

## 2020-12-01 ENCOUNTER — Encounter: Payer: Self-pay | Admitting: Emergency Medicine

## 2020-12-01 ENCOUNTER — Other Ambulatory Visit: Payer: Self-pay

## 2020-12-01 ENCOUNTER — Ambulatory Visit: Payer: Medicare Other | Admitting: Emergency Medicine

## 2020-12-01 DIAGNOSIS — J441 Chronic obstructive pulmonary disease with (acute) exacerbation: Secondary | ICD-10-CM

## 2020-12-01 NOTE — Patient Instructions (Signed)
Please continue Trelegy once daily as you have been taking it.  Rinse and gargle after using. Keep albuterol available to use 2 puffs if needed for shortness of breath, chest tightness, wheezing. Follow Dr. Lamonte Sakai in 1 year or sooner if you have any problems with your breathing.

## 2020-12-01 NOTE — Progress Notes (Signed)
   Subjective:    Patient ID: Adam [name], male    DOB: [birth year], 70 y.o.   MRN: [id number]  COPD His past medical history is significant for COPD.    ROV 05/21/2019 --70 year old man with asthmatic COPD, chronic rhinitis, chronic cough.  He also has a history of obesity, hypertension with diastolic dysfunction, CAD/CABG, restless legs, atrial fibrillation. OSA but cannot tolerate CPAP.  He experienced acute flaring and bronchitis at the beginning of 2020, had some hemoptysis at that time (on anticoagulation).  He returns today reporting that the bronchitis sx improved. He still has exertional dyspnea, limited exercise seems to have been a contributor - has gained 12 lbs.  Currently managed on Breo, Spiriva, Singulair, Flonase. Has albuterol but never uses. No flares since January.   ROV 12/01/2020 --follow-up visit 70 year old man with history of COPD/asthma, chronic rhinitis, chronic cough.  Also with obesity, hypertension, diastolic dysfunction, CAD/CABG, A. fib.  At his last visit a year ago he had been changed to Trelegy but had stopped it.  Plan was for him to restart. He was admitted in May '21 with encephalopathy, thiamine deficiency likely due to EtOH. Had a long rehab stint.  Today he reports that his thinking is much more clear. He has not had a flare since last years He is on Trelegy and feels that it is working well - no dyspnea, no cough, no wheeze. Has albuterol but never needs it.  Remains on Eliquis and dofetilide.  Pepcid, Singulair.  MDM: Reviewed hospitalization notes from 02/2020  No flowsheet data found.  Review of Systems As per hpi      Objective:   Physical Exam Vitals:   12/01/20 1146  BP: (!) 144/88  Pulse: (!) 102  Temp: (!) 97.2 F (36.2 C)  TempSrc: Temporal  SpO2: 97%  Weight: 232 lb 6.4 oz (105.4 kg)  Height: 5\' 10"  (1.778 m)   Gen: Pleasant, obese, in no distress,  normal affect  ENT: No lesions,  mouth clear,  oropharynx clear, no nasal  congestion  Neck: No JVD, no stridor  Lungs: No use of accessory muscles, clear without rales or rhonchi, no wheezing, decreased at both bases  Cardiovascular: RRR, heart sounds normal, no murmur or gallops, trace pretibial peripheral edema  Musculoskeletal: No deformities, no cyanosis or clubbing  Neuro: alert, non focal  Skin: Warm, no lesions or rashes      Assessment & Plan:  COPD (chronic obstructive pulmonary disease) (HCC) Overall clinically doing well since he has been on Trelegy reliably.  Plan to continue same.  He has albuterol available it does not need to use it with any frequency.  No flares.  Please continue Trelegy once daily as you have been taking it.  Rinse and gargle after using. Keep albuterol available to use 2 puffs if needed for shortness of breath, chest tightness, wheezing. Follow Dr. Lamonte Sakai in 1 year or sooner if you have any problems with your breathing.  Baltazar Apo, MD, PhD 12/01/2020, 12:16 PM [facility] [phone] or if no answer [phone]

## 2020-12-01 NOTE — Assessment & Plan Note (Signed)
Overall clinically doing well since he has been on Trelegy reliably.  Plan to continue same.  He has albuterol available it does not need to use it with any frequency.  No flares.  Please continue Trelegy once daily as you have been taking it.  Rinse and gargle after using. Keep albuterol available to use 2 puffs if needed for shortness of breath, chest tightness, wheezing. Follow Dr. Lamonte Sakai in 1 year or sooner if you have any problems with your breathing.

## 2020-12-03 ENCOUNTER — Ambulatory Visit: Payer: Medicare Other

## 2020-12-03 ENCOUNTER — Telehealth: Payer: Self-pay | Admitting: Family Medicine

## 2020-12-03 NOTE — Telephone Encounter (Signed)
Left message asking patient to call office Larene Beach will not be in office today please reschedule 12/03/20 appointment

## 2020-12-04 ENCOUNTER — Other Ambulatory Visit: Payer: Self-pay | Admitting: Family Medicine

## 2020-12-05 NOTE — Progress Notes (Signed)
[facility] Summary Report / Loop Recorder 

## 2020-12-08 ENCOUNTER — Encounter: Payer: Self-pay | Admitting: Internal Medicine

## 2020-12-08 ENCOUNTER — Ambulatory Visit: Payer: Medicare Other | Admitting: Internal Medicine

## 2020-12-08 ENCOUNTER — Other Ambulatory Visit: Payer: Self-pay

## 2020-12-08 VITALS — BP 140/72 | HR 108 | Ht 70.0 in | Wt 233.0 lb

## 2020-12-08 DIAGNOSIS — I10 Essential (primary) hypertension: Secondary | ICD-10-CM

## 2020-12-08 DIAGNOSIS — F1010 Alcohol abuse, uncomplicated: Secondary | ICD-10-CM

## 2020-12-08 DIAGNOSIS — I25708 Atherosclerosis of coronary artery bypass graft(s), unspecified, with other forms of angina pectoris: Secondary | ICD-10-CM | POA: Diagnosis not present

## 2020-12-08 DIAGNOSIS — I480 Paroxysmal atrial fibrillation: Secondary | ICD-10-CM | POA: Diagnosis not present

## 2020-12-08 NOTE — Progress Notes (Signed)
PCP: [name], MD Primary Cardiologist: Dr Martinique Primary EP: [name] is a 70 y.o. male who presents today for routine electrophysiology followup.  Since last being seen in our clinic, the patient reports doing very well.  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  He was in rehab for 6 months.  Now "better".  He feels that his afib is mostly controlled.  He is minimally symptomatic. Today, he denies symptoms of palpitations, chest pain, shortness of breath,  lower extremity edema, dizziness, presyncope, or syncope.  The patient is otherwise without complaint today.   Past Medical History:  Diagnosis Date  . Allergy   . Anxiety    history of PTSD following CABG  . Ascending aortic aneurysm ([place]) 01/31/2018   43 x 42 mm, pt unaware  . Asthma   . Cardiomegaly 10/17/2017  . Colitis- colonoscopy 2014 07/13/2015  . COPD (chronic obstructive pulmonary disease) ([place])   . Coronary artery disease    x 6  . Depression   . Diabetes mellitus without complication ([place])   . Family history of polyps in the colon   . Finger dislocation    Left pinkie  . GERD (gastroesophageal reflux disease)   . Gout   . H/O atrial fibrillation without current medication    following CABG with no documented episodes since then.  . Heart palpitations   . Hx of adenomatous colonic polyps 08/12/2010  . Hyperlipidemia   . Hypertension   . OA (osteoarthritis)   . OSA (obstructive sleep apnea)    Mild, has not received CPAP yet  . Prediabetes   . RLS (restless legs syndrome)   . 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 ANKLE FRACTURE SURGERY Right 1991  . APPENDECTOMY    . ATRIAL FIBRILLATION ABLATION N/A 03/31/2018   Procedure: ATRIAL FIBRILLATION ABLATION;  Surgeon: [name], MD;  Location: [facility];  Service: Cardiovascular;  Laterality: N/A;  . CARDIAC ELECTROPHYSIOLOGY MAPPING AND  ABLATION    . COLONOSCOPY W/ BIOPSIES  2017   x7  . CORONARY ANGIOPLASTY WITH STENT PLACEMENT    . CORONARY ARTERY BYPASS GRAFT    . FINGER SURGERY  04/2018   Small finger left hand  . implantable loop recorder placement  07/02/2019   Medtronic Reveal [place] model [id number] ([place] [id number]) implanted in office by Dr Rayann Heman  . TEE WITHOUT CARDIOVERSION N/A 03/30/2018   Procedure: TRANSESOPHAGEAL ECHOCARDIOGRAM (TEE);  Surgeon: [name], MD;  Location: [facility];  Service: Cardiovascular;  Laterality: N/A;  . TOTAL HIP ARTHROPLASTY Left   . TOTAL HIP ARTHROPLASTY Right 09/12/2018   Procedure: TOTAL HIP ARTHROPLASTY ANTERIOR APPROACH;  Surgeon: [name], MD;  Location: [facility];  Service: Orthopedics;  Laterality: Right;  70 mins    ROS- all systems are reviewed and negatives except as per HPI above  Current Outpatient Medications  Medication Sig Dispense Refill  . acetaminophen (TYLENOL) 325 MG tablet Take 325 mg by mouth every 8 (eight) hours as needed.    Marland Kitchen allopurinol (ZYLOPRIM) 100 MG tablet Take 100 mg by mouth daily.    Marland Kitchen allopurinol (ZYLOPRIM) 300 MG tablet TAKE 1 TABLET(300 MG) BY MOUTH DAILY 90 tablet 3  . apixaban (ELIQUIS) 5 MG TABS tablet Take 1 tablet (5 mg total) by mouth 2 (two) times daily. 180 tablet 1  . B COMPLEX-C-FOLIC ACID PO Take by mouth daily.    Marland Kitchen  Blood Glucose Monitoring Suppl (ONETOUCH VERIO) w/Device KIT Use 1-4 times daily as needed/directed DX E11.9 1 kit 0  . busPIRone (BUSPAR) 7.5 MG tablet Take 1 tablet (7.5 mg total) by mouth 3 (three) times daily. 60 tablet 5  . diltiazem (CARDIZEM CD) 360 MG 24 hr capsule Take 1 capsule (360 mg total) by mouth daily. 90 capsule 3  . divalproex (DEPAKOTE SPRINKLE) 125 MG capsule Take 1 capsule (125 mg total) by mouth 3 (three) times daily. 270 capsule 1  . dofetilide (TIKOSYN) 125 MCG capsule Take 1 capsule (125 mcg total) by mouth 2 (two) times daily. 180 capsule 3  . DULoxetine (CYMBALTA) 60 MG capsule TAKE 1  CAPSULE(60 MG) BY MOUTH DAILY 90 capsule 1  . empagliflozin (JARDIANCE) 25 MG TABS tablet Take 1 tablet (25 mg total) by mouth daily before breakfast. 90 tablet 1  . famotidine (PEPCID) 20 MG tablet Take 20 mg by mouth daily.    . Fluticasone-Umeclidin-Vilant (TRELEGY ELLIPTA) 100-62.5-25 MCG/INH AEPB Inhale 1 puff into the lungs daily. 60 each 3  . gabapentin (NEURONTIN) 300 MG capsule Take 2 capsules at night as needed for restless leg symptoms 180 capsule 3  . insulin glargine, 2 Unit Dial, (TOUJEO MAX SOLOSTAR) 300 UNIT/ML Solostar Pen Inject 60 Units into the skin daily.    . Insulin Pen Needle (NOVOFINE PEN NEEDLE) 32G X 6 MM MISC Use 1-4 times daily as needed for insulin 100 each 3  . JANUVIA 100 MG tablet TAKE 1 TABLET(100 MG) BY MOUTH DAILY 90 tablet 1  . Lancets (ONETOUCH ULTRASOFT) lancets Use 1-4 times daily as needed/directed  DX E11.9 200 each 12  . Lidocaine HCl 4 % CREA Apply topically.    . Magnesium Oxide 400 MG CAPS Take 1 capsule (400 mg total) by mouth daily. 90 capsule 1  . metFORMIN (GLUCOPHAGE-XR) 500 MG 24 hr tablet Take 1 tablet (500 mg total) by mouth in the morning and at bedtime. 180 tablet 3  . montelukast (SINGULAIR) 10 MG tablet TAKE 1 TABLET BY MOUTH AT BEDTIME 90 tablet 3  . Multiple Vitamin (MULTIVITAMIN WITH MINERALS) TABS tablet Take 1 tablet by mouth daily.    Glory Rosebush VERIO test strip Use 1-4 times daily as directed/needed   DX E11.9 200 each 12  . potassium chloride SA (KLOR-CON) 20 MEQ tablet Take 2 tablets once daily 180 tablet 3  . PSYLLIUM HUSK PO Take by mouth. 2 cap twice daily    . rosuvastatin (CRESTOR) 40 MG tablet Take 1 tablet (40 mg total) by mouth daily. 90 tablet 3  . saccharomyces boulardii (FLORASTOR) 250 MG capsule Take 250 mg by mouth 2 (two) times daily.    Marland Kitchen thiamine (VITAMIN B-1) 100 MG tablet Take 100 mg by mouth daily.    Marland Kitchen VASCEPA 1 g capsule TAKE 2 CAPSULES(2 GRAMS) BY MOUTH TWICE DAILY 180 capsule 1  . losartan (COZAAR) 100 MG  tablet Take 1 tablet (100 mg total) by mouth daily. 90 tablet 1   No current facility-administered medications for this visit.    Physical Exam: Vitals:   12/08/20 1437  BP: 140/72  Pulse: (!) 108  SpO2: 96%  Weight: 233 lb (105.7 kg)  Height: '5\' 10"'  (1.778 m)    GEN- The patient is well appearing, alert and oriented x 3 today.   Head- normocephalic, atraumatic Eyes-  Sclera clear, conjunctiva pink Ears- hearing intact Oropharynx- clear Lungs-  normal work of breathing Heart- irregular rate and rhythm,  GI- soft,  Extremities- no clubbing, cyanosis, or edema  Wt Readings from Last 3 Encounters:  12/08/20 233 lb (105.7 kg)  12/01/20 232 lb 6.4 oz (105.4 kg)  10/15/20 221 lb 12.8 oz (100.6 kg)    EKG tracing ordered today is personally reviewed and shows afib  Assessment and Plan:  1. Paroxysmal atrial fibrillation afib burden by ILR is 15% (previously 43%) chads2vasc score is 4.  He is on eliquis We will need to follow him closely on tikosyn to avoid toxicity with this medicine Qt is stable today Labs from 12/21 reviewed bmet, mg today Lifestyle modification is essential for him to do well long term  2. OSA Not compliant with therapy  3. ETOH Avoidance advised He has struggled with withdrawal when in the hospital previously and has been diagnosed with Wernicke's encephalopathy. I have advised complete ETOH cessation.  He is still drinking 2 drinks per day.  4. CAD s/p CABG Follows with Dr Martinique No ischemic symptoms today  5. HTN Stable No change required today   Risks, benefits and potential toxicities for medications prescribed and/or refilled reviewed with patient today.   Follow-up in the AF clinic every 3 months and with Dr Martinique I will see when needed  [name] MD, [facility] 12/08/2020 2:40 PM

## 2020-12-08 NOTE — Patient Instructions (Addendum)
Medication Instructions:  Your physician recommends that you continue on your current medications as directed. Please refer to the Current Medication list given to you today.  Labwork: You will get lab work today:  BMP and magnesium  Testing/Procedures: None ordered.  Follow-Up: Your physician wants you to follow-up in: 3 months with AFIB clinic.   Remote monitoring is used to monitor your loop recorder from home.   Any Other Special Instructions Will Be Listed Below (If Applicable).  If you need a refill on your cardiac medications before your next appointment, please call your pharmacy.

## 2020-12-09 LAB — MAGNESIUM: Magnesium: 1.8 mg/dL (ref 1.6–2.3)

## 2020-12-09 LAB — BASIC METABOLIC PANEL
BUN/Creatinine Ratio: 15 (ref 10–24)
BUN: 12 mg/dL (ref 8–27)
CO2: 22 mmol/L (ref 20–29)
Calcium: 9.8 mg/dL (ref 8.6–10.2)
Chloride: 102 mmol/L (ref 96–106)
Creatinine, Ser: 0.78 mg/dL (ref 0.76–1.27)
Glucose: 99 mg/dL (ref 65–99)
Potassium: 3.9 mmol/L (ref 3.5–5.2)
Sodium: 143 mmol/L (ref 134–144)
eGFR: 96 mL/min/{1.73_m2} (ref >59)

## 2020-12-12 ENCOUNTER — Encounter: Payer: Self-pay | Admitting: Family Medicine

## 2020-12-22 ENCOUNTER — Encounter: Payer: Self-pay | Admitting: Family Medicine

## 2020-12-22 ENCOUNTER — Other Ambulatory Visit: Payer: Self-pay

## 2020-12-22 ENCOUNTER — Ambulatory Visit (INDEPENDENT_AMBULATORY_CARE_PROVIDER_SITE_OTHER): Payer: Medicare Other | Admitting: Family Medicine

## 2020-12-22 VITALS — BP 130/80 | HR 99 | Temp 98.5°F | Ht 70.0 in | Wt 236.6 lb

## 2020-12-22 DIAGNOSIS — E1165 Type 2 diabetes mellitus with hyperglycemia: Secondary | ICD-10-CM

## 2020-12-22 DIAGNOSIS — E1140 Type 2 diabetes mellitus with diabetic neuropathy, unspecified: Secondary | ICD-10-CM

## 2020-12-22 DIAGNOSIS — I4819 Other persistent atrial fibrillation: Secondary | ICD-10-CM | POA: Diagnosis not present

## 2020-12-22 DIAGNOSIS — Z794 Long term (current) use of insulin: Secondary | ICD-10-CM

## 2020-12-22 DIAGNOSIS — F1010 Alcohol abuse, uncomplicated: Secondary | ICD-10-CM | POA: Diagnosis not present

## 2020-12-22 LAB — POCT GLYCOSYLATED HEMOGLOBIN (HGB A1C): HbA1c, POC (prediabetic range): 6.4 % (ref 5.7–6.4)

## 2020-12-22 NOTE — Progress Notes (Signed)
 Established Patient Office Visit  Subjective:  Patient ID: Adam [name], male    DOB: 04/08/1951  Age: 70 y.o. MRN: [id number]  CC:  Chief Complaint  Patient presents with  . Follow-up    HPI Traveion D Sunga presents for medical follow-up.  He has multiple chronic problems including history of atrial fibrillation, CAD, hypertension, COPD, history of alcohol abuse, hyperlipidemia, type 2 diabetes.  Prolonged rehab stay during the past year.  He was there for months.  Multiple falls leading up to his admission.  Suspicion for Warnicke's encephalopathy secondary to longstanding alcohol abuse.  He remains on thiamine at this time.  Wife states that he is still abusing alcohol.  He relates 2-3 drinks per day.  Does still have some balance issues but no falls since last visit.  His diabetes have been very poorly controlled during his stay in rehab.  His last A1c back in December was 10.1.  He recently had called that he was still having some high readings 130-160 fasting.  We did mention that he needed to make sure he was rotating his injection sites.  He apparently had been injecting in the same site consistently.  He is currently on regimen of Jardiance 25 mg daily, Januvia 100 mg daily, Metformin 1000 mg twice daily, and Toujeo.  He was Toujeo 60 units daily but is taking actually 60 in the morning and 80 at night.  No recent hypoglycemic symptoms.  Recent lipids back in December with fairly well controlled with exception of triglycerides 280.  A1c at that time was over 10.  Renal function stable.  Past Medical History:  Diagnosis Date  . Allergy   . Anxiety    history of PTSD following CABG  . Ascending aortic aneurysm ([facility]) 01/31/2018   43 x 42 mm, pt unaware  . Asthma   . Cardiomegaly 10/17/2017  . Colitis- colonoscopy 2014 07/13/2015  . COPD (chronic obstructive pulmonary disease) ([facility])   . Coronary artery disease    x 6  . Depression   . Diabetes mellitus without complication  ([facility])   . Family history of polyps in the colon   . Finger dislocation    Left pinkie  . GERD (gastroesophageal reflux disease)   . Gout   . H/O atrial fibrillation without current medication    following CABG with no documented episodes since then.  . Heart palpitations   . Hx of adenomatous colonic polyps 08/12/2010  . Hyperlipidemia   . Hypertension   . OA (osteoarthritis)   . OSA (obstructive sleep apnea)    Mild, has not received CPAP yet  . Prediabetes   . RLS (restless legs syndrome)   . 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 ANKLE FRACTURE SURGERY Right 1991  . APPENDECTOMY    . ATRIAL FIBRILLATION ABLATION N/A 03/31/2018   Procedure: ATRIAL FIBRILLATION ABLATION;  Surgeon: Allred, James, MD;  Location: [facility];  Service: Cardiovascular;  Laterality: N/A;  . CARDIAC ELECTROPHYSIOLOGY MAPPING AND ABLATION    . COLONOSCOPY W/ BIOPSIES  2017   x7  . CORONARY ANGIOPLASTY WITH STENT PLACEMENT    . CORONARY ARTERY BYPASS GRAFT    . FINGER SURGERY  04/2018   Small finger left hand  . implantable loop recorder placement  07/02/2019   Medtronic Reveal [place] model [id number] ([place] [id number]) implanted in office by Dr Allred  . TEE WITHOUT CARDIOVERSION N/A   03/30/2018   Procedure: TRANSESOPHAGEAL ECHOCARDIOGRAM (TEE);  Surgeon: [name], MD;  Location: [facility];  Service: Cardiovascular;  Laterality: N/A;  . TOTAL HIP ARTHROPLASTY Left   . TOTAL HIP ARTHROPLASTY Right 09/12/2018   Procedure: TOTAL HIP ARTHROPLASTY ANTERIOR APPROACH;  Surgeon: [name], MD;  Location: [facility];  Service: Orthopedics;  Laterality: Right;  70 mins    Family History  Problem Relation Age of Onset  . Colon cancer Mother   . Cancer Father        UNKNOWN TYPE  . Heart disease Paternal Grandmother   . Heart disease Paternal Grandfather     Social History   Socioeconomic History  . Marital status: Married     Spouse name: Not on file  . Number of children: 0  . Years of education: Not on file  . Highest education level: Not on file  Occupational History  . Occupation: retired  Tobacco Use  . Smoking status: Former Smoker    Packs/day: 1.00    Years: 7.00    Pack years: 7.00    Types: Cigarettes    Quit date: 10/04/1977    Years since quitting: 43.2  . Smokeless tobacco: Never Used  . Tobacco comment: never smoked over 1 pack   Vaping Use  . Vaping Use: Never used  Substance and Sexual Activity  . Alcohol use: Yes    Alcohol/week: 3.0 - 4.0 standard drinks    Types: 1 Glasses of wine, 2 - 3 Shots of liquor per week    Comment: DAILY  . Drug use: No    Comment: Smoked Marijuana back in 1970s  . Sexual activity: Yes  Other Topics Concern  . Not on file  Social History Narrative   Married, no children   Textile industry x yrs   21 off early 60's and 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 work - returned to [employer] from [place] 2014   Enjoys bird watching, reading historical books about WWII, watching tv   Social Determinants of Radio broadcast assistant Strain: Not on [employer] Insecurity: Not on file  Transportation Needs: Not on file  Physical Activity: Not on file  Stress: Not on file  Social Connections: Not on file  Intimate Partner Violence: Not on file    Outpatient Medications Prior to Visit  Medication Sig Dispense Refill  . acetaminophen (TYLENOL) 325 MG tablet Take 325 mg by mouth every 8 (eight) hours as needed.    Marland Kitchen allopurinol (ZYLOPRIM) 100 MG tablet Take 100 mg by mouth daily.    Marland Kitchen allopurinol (ZYLOPRIM) 300 MG tablet TAKE 1 TABLET(300 MG) BY MOUTH DAILY 90 tablet 3  . apixaban (ELIQUIS) 5 MG TABS tablet Take 1 tablet (5 mg total) by mouth 2 (two) times daily. 180 tablet 1  . B COMPLEX-C-FOLIC ACID PO Take by mouth daily.    . Blood Glucose Monitoring Suppl (ONETOUCH VERIO) w/Device KIT Use 1-4 times daily as needed/directed DX E11.9 1 kit 0  . diltiazem (CARDIZEM CD) 360 MG  24 hr capsule Take 1 capsule (360 mg total) by mouth daily. 90 capsule 3  . divalproex (DEPAKOTE SPRINKLE) 125 MG capsule Take 1 capsule (125 mg total) by mouth 3 (three) times daily. 270 capsule 1  . dofetilide (TIKOSYN) 125 MCG capsule Take 1 capsule (125 mcg total) by mouth 2 (two) times daily. 180 capsule 3  . DULoxetine (CYMBALTA) 60 MG capsule TAKE 1 CAPSULE(60 MG) BY MOUTH DAILY 90 capsule 1  . empagliflozin (JARDIANCE) 25  MG TABS tablet Take 1 tablet (25 mg total) by mouth daily before breakfast. 90 tablet 1  . Fluticasone-Umeclidin-Vilant (TRELEGY ELLIPTA) 100-62.5-25 MCG/INH AEPB Inhale 1 puff into the lungs daily. 60 each 3  . gabapentin (NEURONTIN) 300 MG capsule Take 2 capsules at night as needed for restless leg symptoms 180 capsule 3  . insulin glargine, 2 Unit Dial, (TOUJEO MAX SOLOSTAR) 300 UNIT/ML Solostar Pen Inject 60 Units into the skin daily.    . Insulin Pen Needle (NOVOFINE PEN NEEDLE) 32G X 6 MM MISC Use 1-4 times daily as needed for insulin 100 each 3  . JANUVIA 100 MG tablet TAKE 1 TABLET(100 MG) BY MOUTH DAILY 90 tablet 1  . Lancets (ONETOUCH ULTRASOFT) lancets Use 1-4 times daily as needed/directed  DX E11.9 200 each 12  . Lidocaine HCl 4 % CREA Apply topically.    . Magnesium Oxide 400 MG CAPS Take 1 capsule (400 mg total) by mouth daily. 90 capsule 1  . metFORMIN (GLUCOPHAGE-XR) 500 MG 24 hr tablet Take 1 tablet (500 mg total) by mouth in the morning and at bedtime. 180 tablet 3  . montelukast (SINGULAIR) 10 MG tablet TAKE 1 TABLET BY MOUTH AT BEDTIME 90 tablet 3  . Multiple Vitamin (MULTIVITAMIN WITH MINERALS) TABS tablet Take 1 tablet by mouth daily.    Glory Rosebush VERIO test strip Use 1-4 times daily as directed/needed   DX E11.9 200 each 12  . potassium chloride SA (KLOR-CON) 20 MEQ tablet Take 2 tablets once daily 180 tablet 3  . PSYLLIUM HUSK PO Take by mouth. 2 cap twice daily    . saccharomyces boulardii (FLORASTOR) 250 MG capsule Take 250 mg by mouth 2 (two)  times daily.    Marland Kitchen thiamine (VITAMIN B-1) 100 MG tablet Take 100 mg by mouth daily.    Marland Kitchen VASCEPA 1 g capsule TAKE 2 CAPSULES(2 GRAMS) BY MOUTH TWICE DAILY 180 capsule 1  . busPIRone (BUSPAR) 7.5 MG tablet Take 1 tablet (7.5 mg total) by mouth 3 (three) times daily. 60 tablet 5  . famotidine (PEPCID) 20 MG tablet Take 20 mg by mouth daily.    Marland Kitchen losartan (COZAAR) 100 MG tablet Take 1 tablet (100 mg total) by mouth daily. 90 tablet 1  . rosuvastatin (CRESTOR) 40 MG tablet Take 1 tablet (40 mg total) by mouth daily. 90 tablet 3   No facility-administered medications prior to visit.    Allergies  Allergen Reactions  . Xarelto [Rivaroxaban] Other (See Comments)    Patient stated he "ended up in the hospital with a rectal bleed"  . Amoxicillin Rash and Other (See Comments)    * SEVERE RASH IN GROIN AREA Has patient had a PCN reaction causing immediate rash, facial/tongue/throat swelling, SOB or lightheadedness with hypotension: no Has patient had a PCN reaction causing severe rash involving mucus membranes or skin necrosis: no Has patient had a PCN reaction that required hospitalization: no Has patient had a PCN reaction occurring within the last 10 years: yes If all of the above answers are "NO", then may proceed with Cephalosporin use.   . Augmentin [Amoxicillin-Pot Clavulanate] Rash and Other (See Comments)    * SEVERE RASH IN GROIN AREA  . Azithromycin Rash and Other (See Comments)    * SEVERE RASH IN GROIN AREA  . Clindamycin/Lincomycin Rash  . Keflex [Cephalexin] Rash    ROS Review of Systems  Constitutional: Positive for fatigue.  Eyes: Negative for visual disturbance.  Respiratory: Negative for cough, chest tightness  and shortness of breath.   Cardiovascular: Negative for chest pain, palpitations and leg swelling.  Neurological: Negative for dizziness, syncope, weakness, light-headedness and headaches.      Objective:    Physical Exam Vitals reviewed.  Cardiovascular:      Rate and Rhythm: Normal rate.     Comments: Irregular rhythm consistent with atrial fibrillation Pulmonary:     Effort: Pulmonary effort is normal.     Breath sounds: Normal breath sounds.  Musculoskeletal:     Right lower leg: No edema.     Left lower leg: No edema.  Skin:    Comments: Feet reveal no lesions.  He has some mild impairment with monofilament testing on the volar surface of several toes.  Neurological:     Mental Status: He is alert.     BP 130/80   Pulse 99   Temp 98.5 F (36.9 C) (Oral)   Ht 5' 10" (1.778 m)   Wt 236 lb 9 oz (107.3 kg)   SpO2 97%   BMI 33.94 kg/m  Wt Readings from Last 3 Encounters:  12/22/20 236 lb 9 oz (107.3 kg)  12/08/20 233 lb (105.7 kg)  12/01/20 232 lb 6.4 oz (105.4 kg)     Health Maintenance Due  Topic Date Due  . COLONOSCOPY (Pts 45-49yrs Insurance coverage will need to be confirmed)  11/19/2018  . TETANUS/TDAP  10/07/2019  . FOOT EXAM  09/23/2020    There are no preventive care reminders to display for this patient.  Lab Results  Component Value Date   TSH 3.137 02/08/2020   Lab Results  Component Value Date   WBC 7.9 09/23/2020   HGB 14.1 09/23/2020   HCT 40.4 09/23/2020   MCV 92.9 09/23/2020   PLT 196 09/23/2020   Lab Results  Component Value Date   NA 143 12/08/2020   K 3.9 12/08/2020   CO2 22 12/08/2020   GLUCOSE 99 12/08/2020   BUN 12 12/08/2020   CREATININE 0.78 12/08/2020   BILITOT 0.7 09/23/2020   ALKPHOS 69 02/20/2020   AST 20 09/23/2020   ALT 16 09/23/2020   PROT 7.6 09/23/2020   ALBUMIN 3.3 (L) 03/03/2020   CALCIUM 9.8 12/08/2020   ANIONGAP 8 03/03/2020   GFR 75.74 12/03/2019   Lab Results  Component Value Date   CHOL 137 09/23/2020   Lab Results  Component Value Date   HDL 40 09/23/2020   Lab Results  Component Value Date   LDLCALC 64 09/23/2020   Lab Results  Component Value Date   TRIG 280 (H) 09/23/2020   Lab Results  Component Value Date   CHOLHDL 3.4 09/23/2020   Lab  Results  Component Value Date   HGBA1C 6.4 12/22/2020      Assessment & Plan:   #1 type 2 diabetes greatly improved with A1c reduction from 10.1 to 6.4%.  Continue current regimen.  Reviewed signs and symptoms of hypoglycemia.  Slowly back down on long-acting insulin if he has any low reactions.  #2 hypertension-stable  #3 persistent atrial fibrillation.  Patient on Eliquis, Tikosyn, and Cardizem per cardiology.  #4 longstanding history of ongoing alcohol abuse.  He is now off benzos.  Continue thiamine segmentation.  Strongly advised he stop alcohol altogether.  Low motivation to quit.  No orders of the defined types were placed in this encounter.   Follow-up: No follow-ups on file.     , MD 

## 2020-12-22 NOTE — Patient Instructions (Signed)
Hypoglycemia Hypoglycemia occurs when the level of sugar (glucose) in the blood is too low. Hypoglycemia can happen in people who have or do not have diabetes. It can develop quickly, and it can be a medical emergency. For most people, a blood glucose level below 70 mg/dL (3.9 mmol/L) is considered hypoglycemia. Glucose is a type of sugar that provides the body's main source of energy. Certain hormones (insulin and glucagon) control the level of glucose in the blood. Insulin lowers blood glucose, and glucagon raises blood glucose. Hypoglycemia can result from having too much insulin in the bloodstream, or from not eating enough food that contains glucose. You may also have reactive hypoglycemia, which happens within 4 hours after eating a meal. What are the causes? Hypoglycemia occurs most often in people who have diabetes and may be caused by:  Diabetes medicine.  Not eating enough, or not eating often enough.  Increased physical activity.  Drinking alcohol on an empty stomach. If you do not have diabetes, hypoglycemia may be caused by:  A tumor in the pancreas.  Not eating enough, or not eating for long periods at a time (fasting).  A severe infection or illness.  Problems after having bariatric surgery.  Organ failure, such as kidney or liver failure.  Certain medicines. What increases the risk? Hypoglycemia is more likely to develop in people who:  Have diabetes and take medicines to lower blood glucose.  Abuse alcohol.  Have a severe illness. What are the signs or symptoms? Symptoms vary depending on whether the condition is mild, moderate, or severe. Mild hypoglycemia  Hunger.  Anxiety.  Sweating and feeling clammy.  Dizziness or feeling light-headed.  Sleepiness or restless sleep.  Nausea.  Increased heart rate.  Headache.  Blurry vision.  Irritability.  Tingling or numbness around the mouth, lips, or tongue.  A change in coordination. Moderate  hypoglycemia  Confusion and poor judgment.  Behavior changes.  Weakness.  Irregular heartbeat. Severe hypoglycemia Severe hypoglycemia is a medical emergency. It can cause:  Fainting.  Seizures.  Loss of consciousness (coma).  Death. How is this diagnosed? Hypoglycemia is diagnosed with a blood test to measure your blood glucose level. This blood test is done while you are having symptoms. Your health care provider may also do a physical exam and review your medical history. How is this treated? This condition can be treated by immediately eating or drinking something that contains sugar with 15 grams of rapid-acting carbohydrate, such as:  4 oz (120 mL) of fruit juice.  4-6 oz (120-150 mL) of regular soda (not diet soda).  8 oz (240 mL) of low-fat milk.  Several pieces of hard candy. Check food labels to find out how many to eat for 15 grams.  1 Tbsp (15 mL) of sugar or honey. Treating hypoglycemia if you have diabetes If you are alert and able to swallow safely, follow the 15:15 rule:  Take 15 grams of a rapid-acting carbohydrate. Talk with your health care provider about how much you should take. Options for getting 15 grams of rapid-acting carbohydrate include: ? Glucose tablets (take 4 tablets). ? Several pieces of hard candy. Check food labels to find out how many pieces to eat for 15 grams. ? 4 oz (120 mL) of fruit juice. ? 4-6 oz (120-150 mL) of regular (not diet) soda. ? 1 Tbsp (15 mL) of honey or sugar.  Check your blood glucose 15 minutes after you take the carbohydrate.  If the repeat blood glucose level is   still at or below 70 mg/dL (3.9 mmol/L), take 15 grams of a carbohydrate again.  If your blood glucose level does not increase above 70 mg/dL (3.9 mmol/L) after 3 tries, seek emergency medical care.  After your blood glucose level returns to normal, eat a meal or a snack within 1 hour.   Treating severe hypoglycemia Severe hypoglycemia is when your  blood glucose level is at or below 54 mg/dL (3 mmol/L). Severe hypoglycemia is a medical emergency. Get medical help right away. If you have severe hypoglycemia and you cannot eat or drink, you will need to be given glucagon. A family member or close friend should learn how to check your blood glucose and how to give you glucagon. Ask your health care provider if you need to have an emergency glucagon kit available. Severe hypoglycemia may need to be treated in a hospital. The treatment may include getting glucose through an IV. You may also need treatment for the cause of your hypoglycemia. Follow these instructions at home: General instructions  Take over-the-counter and prescription medicines only as told by your health care provider.  Monitor your blood glucose as told by your health care provider.  If you drink alcohol: ? Limit how much you use to:  0-1 drink a day for nonpregnant women.  0-2 drinks a day for men. ? Be aware of how much alcohol is in your drink. In the [place] one drink equals one 12 oz bottle of beer (355 mL), one 5 oz glass of wine (148 mL), or one 1 oz glass of hard liquor (44 mL).  Keep all follow-up visits as told by your health care provider. This is important. If you have diabetes:  Always have a rapid-acting carbohydrate (15 grams) option with you to treat low blood glucose.  Follow your diabetes management plan as directed. Make sure you: ? Know the symptoms of hypoglycemia. It is important to treat it right away to prevent it from becoming severe. ? Check your blood glucose as often as told. Always check before and after exercise. ? Always check your blood glucose before you drive a motorized vehicle. ? Take your medicines as told. ? Follow your meal plan. Eat on time, and do not skip meals.  Share your diabetes management plan with people in your workplace, school, and household.  Carry a medical alert card or wear medical alert jewelry.   Contact a  health care provider if:  You have problems keeping your blood glucose in your target range.  You have frequent episodes of hypoglycemia. Get help right away if:  You continue to have hypoglycemia symptoms after eating or drinking something that contains 15 grams of fast-acting carbohydrate and you cannot get your blood glucose above 70 mg/dL (3.9 mmol/L) while following the 15:15 rule.  Your blood glucose is at or below 54 mg/dL (3 mmol/L).  You have a seizure.  You faint. These symptoms may represent a serious problem that is an emergency. Do not wait to see if the symptoms will go away. Get medical help right away. Call your local emergency services (911 in the [place] Do not drive yourself to the hospital. Summary  Hypoglycemia occurs when the level of sugar (glucose) in the blood is too low.  Hypoglycemia can happen in people who have or do not have diabetes. It can develop quickly, and it can be a medical emergency.  Make sure you know the symptoms of hypoglycemia and how to treat it.  Always have a  rapid-acting carbohydrate snack with you to treat low blood sugar. This information is not intended to replace advice given to you by your health care provider. Make sure you discuss any questions you have with your health care provider. Document Revised: 08/15/2019 Document Reviewed: 08/15/2019 [employer] Patient Education  2021 [place].  Your A1C is greatly improved today at 6.4%.    Continue with the thiamine supplement daily  Let's plan on 3-4 month follow up.

## 2020-12-23 NOTE — Progress Notes (Signed)
Subjective:   Adam [name] is a 70 y.o. male who presents for Medicare Annual/Subsequent preventive examination.  Review of Systems    N/A  Cardiac Risk Factors include: advanced age (>95mn, >>36women);male gender;hypertension;diabetes mellitus;dyslipidemia     Objective:    Today's Vitals   12/24/20 1120  BP: (!) 150/80  Pulse: 100  Temp: 98 F (36.7 C)  SpO2: 98%  Weight: 231 lb (104.8 kg)  Height: _0  (1.778 m)   Body mass index is 33.15 kg/m.  Advanced Directives 12/24/2020 02/20/2020 02/18/2020 02/10/2020 02/09/2020 10/24/2019 09/12/2018  Does Patient Have a Medical Advance Directive? Yes Yes - - Yes No Yes  Type of Advance Directive Living will Out of facility DNR (pink MOST or yellow form) Out of facility DNR (pink MOST or yellow form) - - - HPress photographerLiving will  Does patient want to make changes to medical advance directive? No - Patient declined No - Patient declined No - Patient declined No - Patient declined No - Guardian declined - No - Patient declined  [place]? - - - - - - -  Would patient like information on creating a medical advance directive? - - - - - No - Patient declined -    Current Medications (verified) Outpatient Encounter Medications as of 12/24/2020  Medication Sig  . acetaminophen (TYLENOL) 325 MG tablet Take 325 mg by mouth every 8 (eight) hours as needed.  .Marland Kitchenallopurinol (ZYLOPRIM) 300 MG tablet TAKE 1 TABLET(300 MG) BY MOUTH DAILY  . apixaban (ELIQUIS) 5 MG TABS tablet Take 1 tablet (5 mg total) by mouth 2 (two) times daily.  . B COMPLEX-C-FOLIC ACID PO Take by mouth daily.  . Blood Glucose Monitoring Suppl (ONETOUCH VERIO) w/Device KIT Use 1-4 times daily as needed/directed DX E11.9  . diltiazem (CARDIZEM CD) 360 MG 24 hr capsule Take 1 capsule (360 mg total) by mouth daily.  . divalproex (DEPAKOTE SPRINKLE) 125 MG capsule Take 1 capsule (125 mg total) by mouth 3 (three) times daily.   .Marland Kitchendofetilide (TIKOSYN) 125 MCG capsule Take 1 capsule (125 mcg total) by mouth 2 (two) times daily.  . DULoxetine (CYMBALTA) 60 MG capsule TAKE 1 CAPSULE(60 MG) BY MOUTH DAILY  . empagliflozin (JARDIANCE) 25 MG TABS tablet Take 1 tablet (25 mg total) by mouth daily before breakfast.  . Fluticasone-Umeclidin-Vilant (TRELEGY ELLIPTA) 100-62.5-25 MCG/INH AEPB Inhale 1 puff into the lungs daily.  .Marland Kitchengabapentin (NEURONTIN) 300 MG capsule Take 2 capsules at night as needed for restless leg symptoms  . insulin glargine, 2 Unit Dial, (TOUJEO MAX SOLOSTAR) 300 UNIT/ML Solostar Pen Inject 60 Units into the skin daily.  .Marland KitchenJANUVIA 100 MG tablet TAKE 1 TABLET(100 MG) BY MOUTH DAILY  . Lancets (ONETOUCH ULTRASOFT) lancets Use 1-4 times daily as needed/directed  DX E11.9  . Lidocaine HCl 4 % CREA Apply topically.  . Magnesium Oxide 400 MG CAPS Take 1 capsule (400 mg total) by mouth daily.  . metFORMIN (GLUCOPHAGE-XR) 500 MG 24 hr tablet Take 1 tablet (500 mg total) by mouth in the morning and at bedtime.  . montelukast (SINGULAIR) 10 MG tablet TAKE 1 TABLET BY MOUTH AT BEDTIME  . Insulin Pen Needle (NOVOFINE PEN NEEDLE) 32G X 6 MM MISC Use 1-4 times daily as needed for insulin (Patient not taking: Reported on 12/24/2020)  . losartan (COZAAR) 100 MG tablet Take 1 tablet (100 mg total) by mouth daily.  . Multiple Vitamin (MULTIVITAMIN WITH  MINERALS) TABS tablet Take 1 tablet by mouth daily.  Glory Rosebush VERIO test strip Use 1-4 times daily as directed/needed   DX E11.9  . potassium chloride SA (KLOR-CON) 20 MEQ tablet Take 2 tablets once daily  . PSYLLIUM HUSK PO Take by mouth. 2 cap twice daily  . rosuvastatin (CRESTOR) 40 MG tablet Take 1 tablet (40 mg total) by mouth daily.  Marland Kitchen saccharomyces boulardii (FLORASTOR) 250 MG capsule Take 250 mg by mouth 2 (two) times daily.  Marland Kitchen thiamine (VITAMIN B-1) 100 MG tablet Take 100 mg by mouth daily.  Marland Kitchen VASCEPA 1 g capsule TAKE 2 CAPSULES(2 GRAMS) BY MOUTH TWICE DAILY  .  [DISCONTINUED] allopurinol (ZYLOPRIM) 100 MG tablet Take 100 mg by mouth daily.   No facility-administered encounter medications on file as of 12/24/2020.    Allergies (verified) Xarelto [rivaroxaban], Amoxicillin, Augmentin [amoxicillin-pot clavulanate], Azithromycin, Clindamycin/lincomycin, and Keflex [cephalexin]   History: Past Medical History:  Diagnosis Date  . Allergy   . Anxiety    history of PTSD following CABG  . Ascending aortic aneurysm ([place]) 01/31/2018   43 x 42 mm, pt unaware  . Asthma   . Cardiomegaly 10/17/2017  . Colitis- colonoscopy 2014 07/13/2015  . COPD (chronic obstructive pulmonary disease) ([place])   . Coronary artery disease    x 6  . Depression   . Diabetes mellitus without complication ([place])   . Family history of polyps in the colon   . Finger dislocation    Left pinkie  . GERD (gastroesophageal reflux disease)   . Gout   . H/O atrial fibrillation without current medication    following CABG with no documented episodes since then.  . Heart palpitations   . Hx of adenomatous colonic polyps 08/12/2010  . Hyperlipidemia   . Hypertension   . OA (osteoarthritis)   . OSA (obstructive sleep apnea)    Mild, has not received CPAP yet  . Prediabetes   . RLS (restless legs syndrome)   . 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 ANKLE FRACTURE SURGERY Right 1991  . APPENDECTOMY    . ATRIAL FIBRILLATION ABLATION N/A 03/31/2018   Procedure: ATRIAL FIBRILLATION ABLATION;  Surgeon: [name], MD;  Location: [facility];  Service: Cardiovascular;  Laterality: N/A;  . CARDIAC ELECTROPHYSIOLOGY MAPPING AND ABLATION    . COLONOSCOPY W/ BIOPSIES  2017   x7  . CORONARY ANGIOPLASTY WITH STENT PLACEMENT    . CORONARY ARTERY BYPASS GRAFT    . FINGER SURGERY  04/2018   Small finger left hand  . implantable loop recorder placement  07/02/2019   Medtronic Reveal [place] model [id number] ([place] [id number])  implanted in office by Dr Rayann Heman  . TEE WITHOUT CARDIOVERSION N/A 03/30/2018   Procedure: TRANSESOPHAGEAL ECHOCARDIOGRAM (TEE);  Surgeon: [name], MD;  Location: [facility];  Service: Cardiovascular;  Laterality: N/A;  . TOTAL HIP ARTHROPLASTY Left   . TOTAL HIP ARTHROPLASTY Right 09/12/2018   Procedure: TOTAL HIP ARTHROPLASTY ANTERIOR APPROACH;  Surgeon: [name], MD;  Location: [facility];  Service: Orthopedics;  Laterality: Right;  70 mins   Family History  Problem Relation Age of Onset  . Colon cancer Mother   . Cancer Father        UNKNOWN TYPE  . Heart disease Paternal Grandmother   . Heart disease Paternal Grandfather    Social History   Socioeconomic History  . Marital status: Married  Spouse name: Not on file  . Number of children: 0  . Years of education: Not on file  . Highest education level: Not on file  Occupational History  . Occupation: retired  Tobacco Use  . Smoking status: Former Smoker    Packs/day: 1.00    Years: 7.00    Pack years: 7.00    Types: Cigarettes    Quit date: 10/04/1977    Years since quitting: 43.2  . Smokeless tobacco: Never Used  . Tobacco comment: never smoked over 1 pack   Vaping Use  . Vaping Use: Never used  Substance and Sexual Activity  . Alcohol use: Yes    Alcohol/week: 3.0 - 4.0 standard drinks    Types: 1 Glasses of wine, 2 - 3 Shots of liquor per week    Comment: DAILY  . Drug use: No    Comment: Smoked Marijuana back in 1970s  . Sexual activity: Yes  Other Topics Concern  . 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 Difficulty of Paying Living Expenses: Not hard at all  Food Insecurity: Not on file  Transportation Needs: No  Transportation Needs  . Lack of Transportation (Medical): No  . Lack of Transportation (Non-Medical): No  Physical Activity: Inactive  . Days of Exercise per Week: 0 days  . Minutes of Exercise per Session: 0 min  Stress: No Stress Concern Present  . Feeling of Stress : Not at all  Social Connections: Moderately Isolated  . Frequency of Communication with Friends and Family: More than three times a week  . Frequency of Social Gatherings with Friends and Family: Never  . Attends Religious Services: Never  . Active Member of Clubs or Organizations: No  . Attends Archivist Meetings: Never  . Marital Status: Married    Tobacco Counseling Counseling given: Not Answered Comment: never smoked over 1 pack    Clinical Intake:  Pre-visit preparation completed: Yes  Pain : No/denies pain     Nutritional Risks: None Diabetes: Yes CBG done?: No Did pt. bring in CBG monitor from home?: No  How often do you need to have someone help you when you read instructions, pamphlets, or other written materials from your doctor or pharmacy?: 1 - Never What is the last grade level you completed in school?: college  Diabetic? Yes Nutrition Risk Assessment:  Has the patient had any N/V/D within the last 2 months?  No  Does the patient have any non-healing wounds?  No  Has the patient had any unintentional weight loss or weight gain?  No   Diabetes:  Is the patient diabetic?  Yes  If diabetic, was a CBG obtained today?  No  Did the patient bring in their glucometer from home?  No  How often do you monitor your CBG's? Does not check at home .   Financial Strains and Diabetes Management:  Are you having any financial strains with the device, your supplies or your medication? No .  Does the patient want to be seen by Chronic Care Management for management of their diabetes?  No  Would the patient like to be referred to a Nutritionist or for Diabetic  Management?  No   Diabetic  Exams:  Diabetic Eye Exam: Completed 09/01/2020 Diabetic Foot Exam: Overdue, Pt has been advised about the importance in completing this exam. Pt is scheduled for diabetic foot exam on next in person office visit .   Interpreter Needed?: No  Information entered by :: [place] of Daily Living In your present state of health, do you have any difficulty performing the following activities: 12/24/2020 02/27/2020  Hearing? N N  Vision? N N  Difficulty concentrating or making decisions? Y N  Comment short term -  Walking or climbing stairs? Y Y  Dressing or bathing? N Y  Doing errands, shopping? Y N  Preparing Food and eating ? N -  Using the Toilet? N -  In the past six months, have you accidently leaked urine? Y -  Comment incontince -  Do you have problems with loss of bowel control? N -  Managing your Medications? N -  Managing your Finances? N -  Housekeeping or managing your Housekeeping? N -  Some recent data might be hidden    Patient Care Team: [name], MD as PCP - General (Family Medicine) [name], MD as PCP - Electrophysiology (Cardiology) Sueanne Margarita, MD as PCP - Sleep Medicine (Sleep Medicine) [name], MD as PCP - Cardiology (Cardiology) Domenic Polite, MD as Referring Physician (Internal Medicine)  Indicate any recent Medical Services you may have received from other than [facility] providers in the past year (date may be approximate).     Assessment:   This is a routine wellness examination for Jacoby.  Hearing/Vision screen  Hearing Screening   _0  _1  _2  _3  _4  _5  _6  _7  _8   Right ear:           Left ear:           Vision Screening Comments: Routine yearly eye exams   Dietary issues and exercise activities discussed: Current Exercise Habits: The patient does not participate in regular exercise at present, Exercise limited by: neurologic condition(s)  Goals    . decrease pain      Consider pain medication and pool exercise    . Exercise 3x per week (30 min per time)     Daily building gait and balance    . mental health outlet     Consider counseling or therapy as an outlet to express feelings about declining health and aging    . Prevent falls      Depression Screen PHQ 2/9 Scores 12/24/2020 11/20/2019 06/26/2018 06/23/2018 06/08/2017 12/21/2016 08/01/2015  PHQ - 2 Score 0 4 0 0 0 0 0  PHQ- 9 Score - 7 - - - - -    Fall Risk Fall Risk  12/24/2020 11/20/2019 06/26/2018 06/26/2018 06/23/2018  Falls in the past year? 1 1 - Yes Yes  Number falls in past yr: 0 1 - 1 1  Injury with Fall? 1 0 Yes Yes Yes  Risk for fall due to : History of fall(s);Impaired balance/gait Medication side effect;Orthopedic patient;Impaired balance/gait;History of fall(s) - - -  Follow up Falls prevention discussed;Falls evaluation completed Falls evaluation completed;Education provided;Falls prevention discussed Education provided Education provided -    FALL RISK PREVENTION PERTAINING TO THE HOME:  Any stairs in or around the home? No  If so, are there any without handrails? No  Home free of loose throw rugs in walkways, pet beds, electrical cords, etc? Yes  Adequate lighting in your home to reduce risk  of falls? Yes   ASSISTIVE DEVICES UTILIZED TO PREVENT FALLS:  Life alert? No  Use of a cane, walker or w/c? No  Grab bars in the bathroom? No  Shower chair or bench in shower? Yes  Elevated toilet seat or a handicapped toilet? No   TIMED UP AND GO:  Was the test performed? Yes .  Length of time to ambulate 10 feet: 5 sec.   Gait slow and steady without use of assistive device  Cognitive Function: MMSE - Mini Mental State Exam 06/26/2018 06/08/2017  Not completed: (No Data) (No Data)     6CIT Screen 12/24/2020 11/20/2019  What Year? 0 points 0 points  What month? 0 points 0 points  What time? 0 points 0 points  Count back from 20 0 points 0 points  Months in reverse 0 points 0  points  Repeat phrase 0 points 0 points  Total Score 0 0    Immunizations Immunization History  Administered Date(s) Administered  . Fluad Quad(high Dose 65+) 06/25/2019, 07/18/2020  . Influenza Split 07/04/2012  . Influenza, High Dose Seasonal PF 05/28/2016, 06/08/2017, 06/23/2018  . Influenza,inj,Quad PF,6+ Mos 07/05/2013, 05/22/2014, 06/17/2015  . PFIZER(Purple Top)SARS-COV-2 Vaccination 12/08/2019, 01/08/2020, 09/28/2020  . Pneumococcal Conjugate-13 12/01/2015  . Pneumococcal Polysaccharide-23 10/05/2006, 11/30/2016  . Td 10/06/2009  . Zoster 08/21/2013  . Zoster Recombinat (Shingrix) 12/02/2016, 04/02/2017    TDAP status: Due, Education has been provided regarding the importance of this vaccine. Advised may receive this vaccine at local pharmacy or Health Dept. Aware to provide a copy of the vaccination record if obtained from local pharmacy or Health Dept. Verbalized acceptance and understanding.  Flu Vaccine status: Up to date  Pneumococcal vaccine status: Up to date  Covid-19 vaccine status: Completed vaccines  Qualifies for Shingles Vaccine? Yes   Zostavax completed Yes   Shingrix Completed?: Yes  Screening Tests Health Maintenance  Topic Date Due  . COLONOSCOPY (Pts 45-53yr Insurance coverage will need to be confirmed)  11/19/2018  . TETANUS/TDAP  10/07/2019  . FOOT EXAM  09/23/2020  . HEMOGLOBIN A1C  06/24/2021  . OPHTHALMOLOGY EXAM  09/01/2021  . INFLUENZA VACCINE  Completed  . COVID-19 Vaccine  Completed  . Hepatitis C Screening  Completed  . PNA vac Low Risk Adult  Completed  . HPV VACCINES  Aged Out    Health Maintenance  Health Maintenance Due  Topic Date Due  . COLONOSCOPY (Pts 45-468yrInsurance coverage will need to be confirmed)  11/19/2018  . TETANUS/TDAP  10/07/2019  . FOOT EXAM  09/23/2020    Colorectal Cancer screening: Currently due, orders placed to Gastroenterology  Lung Cancer Screening: (Low Dose CT Chest recommended if Age 824-80years, 30 pack-year currently smoking OR have quit w/in 15years.) does not qualify.   Lung Cancer Screening Referral: N/A   Additional Screening:  Hepatitis C Screening: does qualify; Completed 06/15/2017  Vision Screening: Recommended annual ophthalmology exams for early detection of glaucoma and other disorders of the eye. Is the patient up to date with their annual eye exam?  Yes  Who is the provider or what is the name of the office in which the patient attends annual eye exams? Dr. JoMacarthur Critchleyf pt is not established with a provider, would they like to be referred to a provider to establish care? No .   Dental Screening: Recommended annual dental exams for proper oral hygiene  Community Resource Referral / Chronic Care Management: CRR required this visit?  No   CCM  required this visit?  No      Plan:     I have personally reviewed and noted the following in the patient's chart:   . Medical and social history . Use of alcohol, tobacco or illicit drugs  . Current medications and supplements . Functional ability and status . Nutritional status . Physical activity . Advanced directives . List of other physicians . Hospitalizations, surgeries, and ER visits in previous 12 months . Vitals . Screenings to include cognitive, depression, and falls . Referrals and appointments  In addition, I have reviewed and discussed with patient certain preventive protocols, quality metrics, and best practice recommendations. A written personalized care plan for preventive services as well as general preventive health recommendations were provided to patient.     Ofilia Neas, LPN   [id number]   Nurse Notes: None

## 2020-12-24 ENCOUNTER — Other Ambulatory Visit: Payer: Self-pay

## 2020-12-24 ENCOUNTER — Ambulatory Visit (INDEPENDENT_AMBULATORY_CARE_PROVIDER_SITE_OTHER): Payer: Medicare Other

## 2020-12-24 VITALS — BP 150/80 | HR 100 | Temp 98.0°F | Ht 70.0 in | Wt 231.0 lb

## 2020-12-24 DIAGNOSIS — Z0000 Encounter for general adult medical examination without abnormal findings: Secondary | ICD-10-CM

## 2020-12-24 DIAGNOSIS — Z1211 Encounter for screening for malignant neoplasm of colon: Secondary | ICD-10-CM | POA: Diagnosis not present

## 2020-12-24 NOTE — Patient Instructions (Addendum)
Adam [name] , Thank you for taking time to come for your Medicare Wellness Visit. I appreciate your ongoing commitment to your health goals. Please review the following plan we discussed and let me know if I can assist you in the future.   Screening recommendations/referrals: Colonoscopy: Currently due-Orders placed today Recommended yearly ophthalmology/optometry visit for glaucoma screening and checkup Recommended yearly dental visit for hygiene and checkup  Vaccinations: Influenza vaccine: completed-due next fall Pneumococcal vaccine: completed Series  Tdap vaccine: currently due - will receive upon injury Shingles vaccine: completed Series   Advanced directives: Pt will provided  Conditions/risks identified: Fall Risk- Advised use patient mobility devices to assist with ambulation.  Next appointment: 12/25/2021 @ 11:15 am with [place] 65 Years and Older, Male Preventive care refers to lifestyle choices and visits with your health care provider that can promote health and wellness. What does preventive care include?  A yearly physical exam. This is also called an annual well check.  Dental exams once or twice a year.  Routine eye exams. Ask your health care provider how often you should have your eyes checked.  Personal lifestyle choices, including:  Daily care of your teeth and gums.  Regular physical activity.  Eating a healthy diet.  Avoiding tobacco and drug use.  Limiting alcohol use.  Practicing safe sex.  Taking low doses of aspirin every day.  Taking vitamin and mineral supplements as recommended by your health care provider. What happens during an annual well check? The services and screenings done by your health care provider during your annual well check will depend on your age, overall health, lifestyle risk factors, and family history of disease. Counseling  Your health care provider may ask you questions about  your:  Alcohol use.  Tobacco use.  Drug use.  Emotional well-being.  Home and relationship well-being.  Sexual activity.  Eating habits.  History of falls.  Memory and ability to understand (cognition).  Work and work Statistician. Screening  You may have the following tests or measurements:  Height, weight, and BMI.  Blood pressure.  Lipid and cholesterol levels. These may be checked every 5 years, or more frequently if you are over 76 years old.  Skin check.  Lung cancer screening. You may have this screening every year starting at age 63 if you have a 30-pack-year history of smoking and currently smoke or have quit within the past 15 years.  Fecal occult blood test (FOBT) of the stool. You may have this test every year starting at age 25.  Flexible sigmoidoscopy or colonoscopy. You may have a sigmoidoscopy every 5 years or a colonoscopy every 10 years starting at age 77.  Prostate cancer screening. Recommendations will vary depending on your family history and other risks.  Hepatitis C blood test.  Hepatitis B blood test.  Sexually transmitted disease (STD) testing.  Diabetes screening. This is done by checking your blood sugar (glucose) after you have not eaten for a while (fasting). You may have this done every 1-3 years.  Abdominal aortic aneurysm (AAA) screening. You may need this if you are a current or former smoker.  Osteoporosis. You may be screened starting at age 11 if you are at high risk. Talk with your health care provider about your test results, treatment options, and if necessary, the need for more tests. Vaccines  Your health care provider may recommend certain vaccines, such as:  Influenza vaccine. This is recommended every year.  Tetanus, diphtheria,  and acellular pertussis (Tdap, Td) vaccine. You may need a Td booster every 10 years.  Zoster vaccine. You may need this after age 15.  Pneumococcal 13-valent conjugate (PCV13) vaccine.  One dose is recommended after age 52.  Pneumococcal polysaccharide (PPSV23) vaccine. One dose is recommended after age 41. Talk to your health care provider about which screenings and vaccines you need and how often you need them. This information is not intended to replace advice given to you by your health care provider. Make sure you discuss any questions you have with your health care provider. Document Released: 10/17/2015 Document Revised: 06/09/2016 Document Reviewed: 07/22/2015 Elsevier Interactive Patient Education  2017 [place] Prevention in the Home Falls can cause injuries. They can happen to people of all ages. There are many things you can do to make your home safe and to help prevent falls. What can I do on the outside of my home?  Regularly fix the edges of walkways and driveways and fix any cracks.  Remove anything that might make you trip as you walk through a door, such as a raised step or threshold.  Trim any bushes or trees on the path to your home.  Use bright outdoor lighting.  Clear any walking paths of anything that might make someone trip, such as rocks or tools.  Regularly check to see if handrails are loose or broken. Make sure that both sides of any steps have handrails.  Any raised decks and porches should have guardrails on the edges.  Have any leaves, snow, or ice cleared regularly.  Use sand or salt on walking paths during winter.  Clean up any spills in your garage right away. This includes oil or grease spills. What can I do in the bathroom?  Use night lights.  Install grab bars by the toilet and in the tub and shower. Do not use towel bars as grab bars.  Use non-skid mats or decals in the tub or shower.  If you need to sit down in the shower, use a plastic, non-slip stool.  Keep the floor dry. Clean up any water that spills on the floor as soon as it happens.  Remove soap buildup in the tub or shower regularly.  Attach bath  mats securely with double-sided non-slip rug tape.  Do not have throw rugs and other things on the floor that can make you trip. What can I do in the bedroom?  Use night lights.  Make sure that you have a light by your bed that is easy to reach.  Do not use any sheets or blankets that are too big for your bed. They should not hang down onto the floor.  Have a firm chair that has side arms. You can use this for support while you get dressed.  Do not have throw rugs and other things on the floor that can make you trip. What can I do in the kitchen?  Clean up any spills right away.  Avoid walking on wet floors.  Keep items that you use a lot in easy-to-reach places.  If you need to reach something above you, use a strong step stool that has a grab bar.  Keep electrical cords out of the way.  Do not use floor polish or wax that makes floors slippery. If you must use wax, use non-skid floor wax.  Do not have throw rugs and other things on the floor that can make you trip. What can I do with my  stairs?  Do not leave any items on the stairs.  Make sure that there are handrails on both sides of the stairs and use them. Fix handrails that are broken or loose. Make sure that handrails are as long as the stairways.  Check any carpeting to make sure that it is firmly attached to the stairs. Fix any carpet that is loose or worn.  Avoid having throw rugs at the top or bottom of the stairs. If you do have throw rugs, attach them to the floor with carpet tape.  Make sure that you have a light switch at the top of the stairs and the bottom of the stairs. If you do not have them, ask someone to add them for you. What else can I do to help prevent falls?  Wear shoes that:  Do not have high heels.  Have rubber bottoms.  Are comfortable and fit you well.  Are closed at the toe. Do not wear sandals.  If you use a stepladder:  Make sure that it is fully opened. Do not climb a closed  stepladder.  Make sure that both sides of the stepladder are locked into place.  Ask someone to hold it for you, if possible.  Clearly mark and make sure that you can see:  Any grab bars or handrails.  First and last steps.  Where the edge of each step is.  Use tools that help you move around (mobility aids) if they are needed. These include:  Canes.  Walkers.  Scooters.  Crutches.  Turn on the lights when you go into a dark area. Replace any light bulbs as soon as they burn out.  Set up your furniture so you have a clear path. Avoid moving your furniture around.  If any of your floors are uneven, fix them.  If there are any pets around you, be aware of where they are.  Review your medicines with your doctor. Some medicines can make you feel dizzy. This can increase your chance of falling. Ask your doctor what other things that you can do to help prevent falls. This information is not intended to replace advice given to you by your health care provider. Make sure you discuss any questions you have with your health care provider. Document Released: 07/17/2009 Document Revised: 02/26/2016 Document Reviewed: 10/25/2014 Elsevier Interactive Patient Education  2017 [employer].

## 2020-12-29 ENCOUNTER — Ambulatory Visit (INDEPENDENT_AMBULATORY_CARE_PROVIDER_SITE_OTHER): Payer: Medicare Other

## 2020-12-29 DIAGNOSIS — I4891 Unspecified atrial fibrillation: Secondary | ICD-10-CM

## 2020-12-29 LAB — CUP PACEART REMOTE DEVICE CHECK
Date Time Interrogation Session: 20220328040007
Implantable Pulse Generator Implant Date: 20200928

## 2020-12-30 ENCOUNTER — Other Ambulatory Visit: Payer: Self-pay

## 2020-12-30 MED ORDER — ICOSAPENT ETHYL 1 G PO CAPS: ORAL_CAPSULE | ORAL | 3 refills | Status: DC

## 2021-01-07 NOTE — Progress Notes (Signed)
[facility] Summary Report / Loop Recorder 

## 2021-01-12 ENCOUNTER — Other Ambulatory Visit: Payer: Self-pay | Admitting: Family Medicine

## 2021-01-12 NOTE — Telephone Encounter (Signed)
Last filled by a historical provider. Ok to refill?

## 2021-01-12 NOTE — Telephone Encounter (Signed)
Refills OK. 

## 2021-01-29 ENCOUNTER — Ambulatory Visit (INDEPENDENT_AMBULATORY_CARE_PROVIDER_SITE_OTHER): Payer: Medicare Other

## 2021-01-29 DIAGNOSIS — I4891 Unspecified atrial fibrillation: Secondary | ICD-10-CM | POA: Diagnosis not present

## 2021-02-02 LAB — CUP PACEART REMOTE DEVICE CHECK
Date Time Interrogation Session: 20220430040102
Implantable Pulse Generator Implant Date: 20200928

## 2021-02-18 NOTE — Progress Notes (Signed)
[facility] Summary Report / Loop Recorder 

## 2021-02-24 ENCOUNTER — Telehealth: Payer: Self-pay | Admitting: Family Medicine

## 2021-02-24 NOTE — Chronic Care Management (AMB) (Signed)
  Chronic Care Management   Note  02/24/2021 Name: Adam [name] MRN: [id number] DOB: 09-05-1951  Adam [name] is a 70 y.o. year old male who is a primary care patient of [name], Alinda Sierras, MD. I reached out to Adam [name] by phone today in response to a referral sent by [name]'s PCP, [name], MD.   [name] was given information about Chronic Care Management services today including:  1. CCM service includes personalized support from designated clinical staff supervised by his physician, including individualized plan of care and coordination with other care providers 2. 24/7 contact phone numbers for assistance for urgent and routine care needs. 3. Service will only be billed when office clinical staff spend 20 minutes or more in a month to coordinate care. 4. Only one practitioner may furnish and bill the service in a calendar month. 5. The patient may stop CCM services at any time (effective at the end of the month) by phone call to the office staff.   Patient did not agree to enrollment in care management services and does not wish to consider at this time.  Follow up plan:   Tatjana Secretary/administrator

## 2021-03-03 ENCOUNTER — Telehealth: Payer: Self-pay | Admitting: Family Medicine

## 2021-03-03 NOTE — Telephone Encounter (Signed)
Patient is calling and is requesting a refill for insulin glargine, 1 Unit Dial, (TOUJEO SOLOSTAR) 300 UNIT/ML Solostar Pen. Also patient is requesting a dosage change to 130 ML a day, please advise CB is [place], [place] - [address]  [address] [place], [place] [address]  Phone:  [phone] Fax:  [phone]

## 2021-03-04 ENCOUNTER — Encounter: Payer: Self-pay | Admitting: Family Medicine

## 2021-03-04 ENCOUNTER — Other Ambulatory Visit: Payer: Self-pay | Admitting: Family Medicine

## 2021-03-04 MED ORDER — TOUJEO MAX SOLOSTAR 300 UNIT/ML ~~LOC~~ SOPN: 130.0000 [IU] | PEN_INJECTOR | Freq: Every day | SUBCUTANEOUS | 3 refills | Status: DC

## 2021-03-04 NOTE — Addendum Note (Signed)
Addended by: Rebecca Eaton on: 03/04/2021 02:30 PM   Modules accepted: Orders

## 2021-03-04 NOTE — Telephone Encounter (Signed)
Rx sent in. Patient also sent in a mychart message, I have responded to that message letting the patient know his medication was sent in. Nothing further needed.

## 2021-03-05 ENCOUNTER — Ambulatory Visit (INDEPENDENT_AMBULATORY_CARE_PROVIDER_SITE_OTHER): Payer: Medicare Other

## 2021-03-05 ENCOUNTER — Encounter: Payer: Self-pay | Admitting: Internal Medicine

## 2021-03-05 DIAGNOSIS — I4891 Unspecified atrial fibrillation: Secondary | ICD-10-CM

## 2021-03-05 LAB — CUP PACEART REMOTE DEVICE CHECK
Date Time Interrogation Session: 20220602040400
Implantable Pulse Generator Implant Date: 20200928

## 2021-03-05 MED ORDER — TOUJEO MAX SOLOSTAR 300 UNIT/ML ~~LOC~~ SOPN
130.0000 [IU] | PEN_INJECTOR | Freq: Every day | SUBCUTANEOUS | 3 refills | Status: DC
Start: 2021-03-05 — End: 2021-07-30

## 2021-03-11 ENCOUNTER — Encounter (HOSPITAL_COMMUNITY): Payer: Self-pay | Admitting: Nurse Practitioner

## 2021-03-11 ENCOUNTER — Other Ambulatory Visit: Payer: Self-pay

## 2021-03-11 ENCOUNTER — Ambulatory Visit (HOSPITAL_COMMUNITY)
Admission: RE | Admit: 2021-03-11 | Discharge: 2021-03-11 | Disposition: A | Discharge status: Home / Self Care | Payer: Medicare Other | Source: Ambulatory Visit | Attending: Nurse Practitioner | Admitting: Nurse Practitioner

## 2021-03-11 VITALS — BP 156/94 | HR 142 | Ht 70.0 in | Wt 246.0 lb

## 2021-03-11 DIAGNOSIS — Z87891 Personal history of nicotine dependence: Secondary | ICD-10-CM | POA: Diagnosis not present

## 2021-03-11 DIAGNOSIS — I4892 Unspecified atrial flutter: Secondary | ICD-10-CM | POA: Diagnosis not present

## 2021-03-11 DIAGNOSIS — E669 Obesity, unspecified: Secondary | ICD-10-CM | POA: Insufficient documentation

## 2021-03-11 DIAGNOSIS — Z7951 Long term (current) use of inhaled steroids: Secondary | ICD-10-CM | POA: Diagnosis not present

## 2021-03-11 DIAGNOSIS — I4891 Unspecified atrial fibrillation: Secondary | ICD-10-CM | POA: Diagnosis not present

## 2021-03-11 DIAGNOSIS — D6869 Other thrombophilia: Secondary | ICD-10-CM | POA: Diagnosis not present

## 2021-03-11 DIAGNOSIS — J449 Chronic obstructive pulmonary disease, unspecified: Secondary | ICD-10-CM | POA: Diagnosis not present

## 2021-03-11 DIAGNOSIS — I2510 Atherosclerotic heart disease of native coronary artery without angina pectoris: Secondary | ICD-10-CM | POA: Diagnosis not present

## 2021-03-11 DIAGNOSIS — I471 Supraventricular tachycardia: Secondary | ICD-10-CM | POA: Diagnosis not present

## 2021-03-11 DIAGNOSIS — Z955 Presence of coronary angioplasty implant and graft: Secondary | ICD-10-CM | POA: Diagnosis not present

## 2021-03-11 DIAGNOSIS — I10 Essential (primary) hypertension: Secondary | ICD-10-CM | POA: Insufficient documentation

## 2021-03-11 DIAGNOSIS — G4733 Obstructive sleep apnea (adult) (pediatric): Secondary | ICD-10-CM | POA: Diagnosis not present

## 2021-03-11 DIAGNOSIS — Z6835 Body mass index (BMI) 35.0-35.9, adult: Secondary | ICD-10-CM | POA: Insufficient documentation

## 2021-03-11 DIAGNOSIS — Z7901 Long term (current) use of anticoagulants: Secondary | ICD-10-CM | POA: Diagnosis not present

## 2021-03-11 DIAGNOSIS — I493 Ventricular premature depolarization: Secondary | ICD-10-CM | POA: Diagnosis not present

## 2021-03-11 DIAGNOSIS — I4819 Other persistent atrial fibrillation: Secondary | ICD-10-CM | POA: Diagnosis not present

## 2021-03-11 DIAGNOSIS — Z79899 Other long term (current) drug therapy: Secondary | ICD-10-CM | POA: Insufficient documentation

## 2021-03-11 DIAGNOSIS — E785 Hyperlipidemia, unspecified: Secondary | ICD-10-CM | POA: Insufficient documentation

## 2021-03-11 DIAGNOSIS — Z951 Presence of aortocoronary bypass graft: Secondary | ICD-10-CM | POA: Insufficient documentation

## 2021-03-11 LAB — COMPREHENSIVE METABOLIC PANEL
ALT: 48 U/L — ABNORMAL HIGH (ref 0–44)
AST: 52 U/L — ABNORMAL HIGH (ref 15–41)
Albumin: 3.9 g/dL (ref 3.5–5.0)
Alkaline Phosphatase: 52 U/L (ref 38–126)
Anion gap: 13 (ref 5–15)
BUN: 9 mg/dL (ref 8–23)
CO2: 25 mmol/L (ref 22–32)
Calcium: 9.1 mg/dL (ref 8.9–10.3)
Chloride: 103 mmol/L (ref 98–111)
Creatinine, Ser: 0.7 mg/dL (ref 0.61–1.24)
GFR, Estimated: >60 mL/min (ref >60)
Glucose, Bld: 113 mg/dL — ABNORMAL HIGH (ref 70–99)
Potassium: 3.9 mmol/L (ref 3.5–5.1)
Sodium: 141 mmol/L (ref 135–145)
Total Bilirubin: 1.1 mg/dL (ref 0.3–1.2)
Total Protein: 7.6 g/dL (ref 6.5–8.1)

## 2021-03-11 LAB — CBC
HCT: 40.2 % (ref 39.0–52.0)
Hemoglobin: 13.3 g/dL (ref 13.0–17.0)
MCH: 31.5 pg (ref 26.0–34.0)
MCHC: 33.1 g/dL (ref 30.0–36.0)
MCV: 95.3 fL (ref 80.0–100.0)
Platelets: 195 10*3/uL (ref 150–400)
RBC: 4.22 MIL/uL (ref 4.22–5.81)
RDW: 14.6 % (ref 11.5–15.5)
WBC: 5.6 10*3/uL (ref 4.0–10.5)
nRBC: 0 % (ref 0.0–0.2)

## 2021-03-11 LAB — MAGNESIUM: Magnesium: 1.8 mg/dL (ref 1.7–2.4)

## 2021-03-11 LAB — TSH: TSH: 2.11 u[IU]/mL (ref 0.350–4.500)

## 2021-03-11 MED ORDER — METOPROLOL TARTRATE 25 MG PO TABS: 25.0000 mg | ORAL_TABLET | Freq: Two times a day (BID) | ORAL | 3 refills | Status: DC

## 2021-03-11 NOTE — Patient Instructions (Signed)
Start metoprolol 25mg twice a day 

## 2021-03-11 NOTE — Progress Notes (Signed)
Primary Care Physician: [name], MD Referring Physician: Dr. Rayann Heman Primary Cardiologist: Dr Martinique  Adam [name] is a 70 y.o. male with a h/o CABG in 2011, done in [place], HTN, HLD, DM, Atrial fibrillation,alcohol use, and OSA. He underwent catheter ablation for atrial fibrillation by Dr. Rayann Heman 03/31/18.He was followed by Dr. Radford Pax in sleep clinic, but unable to tolerate CPAP and turned in his equipment. He was previously on Xarelto for a CHADS2VASC score of 3. Patient was admitted 10/24/19 for GI bleeding and alcohol withdrawal. EP was consulted to assist with dofetilide reloading. His Xarelto was held on admission and was not resumed at discharge, he was eventually placed back on eliquis.  He was discharged to rehab for a prolonged period of time for Wernicke's encephalopathy.   He is now back in the afib clinic for pt feeling rapid heart rate.. By his device, he had had 3 episodes of afib.Fastest rate was 146 bpm, with longest duration of 44 mins. Recent burden was actually improved at 9.4%, previously at 14%. Presenting rhythm for the interpretation was in rapid a  Flutter and he continues in this rhythm today. He continues o drink alcohol, when asked the amount, he states "enough". He continues to have untreated OSA.   Today, he denies symptoms of palpitations, chest pain, mild increase of shortness of breath and fatigue,  orthopnea, PND, lower extremity edema, dizziness, presyncope, syncope, or neurologic sequela. The patient is tolerating medications without difficulties and is otherwise without complaint today.   Past Medical History:  Diagnosis Date  . Allergy   . Anxiety    history of PTSD following CABG  . Ascending aortic aneurysm ([place]) 01/31/2018   43 x 42 mm, pt unaware  . Asthma   . Cardiomegaly 10/17/2017  . Colitis- colonoscopy 2014 07/13/2015  . COPD (chronic obstructive pulmonary disease) ([place])   . Coronary artery disease    x 6  . Depression   . Diabetes  mellitus without complication ([place])   . Family history of polyps in the colon   . Finger dislocation    Left pinkie  . GERD (gastroesophageal reflux disease)   . Gout   . H/O atrial fibrillation without current medication    following CABG with no documented episodes since then.  . Heart palpitations   . Hx of adenomatous colonic polyps 08/12/2010  . Hyperlipidemia   . Hypertension   . OA (osteoarthritis)   . OSA (obstructive sleep apnea)    Mild, has not received CPAP yet  . Prediabetes   . RLS (restless legs syndrome)   . 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 ANKLE FRACTURE SURGERY Right 1991  . APPENDECTOMY    . ATRIAL FIBRILLATION ABLATION N/A 03/31/2018   Procedure: ATRIAL FIBRILLATION ABLATION;  Surgeon: [name], MD;  Location: [facility];  Service: Cardiovascular;  Laterality: N/A;  . CARDIAC ELECTROPHYSIOLOGY MAPPING AND ABLATION    . COLONOSCOPY W/ BIOPSIES  2017   x7  . CORONARY ANGIOPLASTY WITH STENT PLACEMENT    . CORONARY ARTERY BYPASS GRAFT    . FINGER SURGERY  04/2018   Small finger left hand  . implantable loop recorder placement  07/02/2019   Medtronic Reveal [place] model [id number] ([place] [id number]) implanted in office by Dr Rayann Heman  . TEE WITHOUT CARDIOVERSION N/A 03/30/2018   Procedure: TRANSESOPHAGEAL ECHOCARDIOGRAM (TEE);  Surgeon: [name], MD;  Location: [facility];  Service: Cardiovascular;  Laterality: N/A;  . TOTAL HIP ARTHROPLASTY Left   . TOTAL HIP ARTHROPLASTY Right 09/12/2018   Procedure: TOTAL HIP ARTHROPLASTY ANTERIOR APPROACH;  Surgeon: [name], MD;  Location: [facility];  Service: Orthopedics;  Laterality: Right;  70 mins    Current Outpatient Medications  Medication Sig Dispense Refill  . acetaminophen (TYLENOL) 325 MG tablet Take 325 mg by mouth every 8 (eight) hours as needed.    Marland Kitchen allopurinol (ZYLOPRIM) 300 MG tablet TAKE 1 TABLET(300 MG) BY MOUTH DAILY 90  tablet 3  . B COMPLEX-C-FOLIC ACID PO Take by mouth daily.    . Blood Glucose Monitoring Suppl (ONETOUCH VERIO) w/Device KIT Use 1-4 times daily as needed/directed DX E11.9 1 kit 0  . diltiazem (CARDIZEM CD) 360 MG 24 hr capsule Take 1 capsule (360 mg total) by mouth daily. 90 capsule 3  . divalproex (DEPAKOTE SPRINKLE) 125 MG capsule Take 1 capsule (125 mg total) by mouth 3 (three) times daily. 270 capsule 1  . dofetilide (TIKOSYN) 125 MCG capsule Take 1 capsule (125 mcg total) by mouth 2 (two) times daily. 180 capsule 3  . DULoxetine (CYMBALTA) 60 MG capsule TAKE 1 CAPSULE(60 MG) BY MOUTH DAILY 90 capsule 1  . ELIQUIS 5 MG TABS tablet TAKE 1 TABLET(5 MG) BY MOUTH TWICE DAILY 180 tablet 1  . Fluticasone-Umeclidin-Vilant (TRELEGY ELLIPTA) 100-62.5-25 MCG/INH AEPB Inhale 1 puff into the lungs daily. 60 each 3  . gabapentin (NEURONTIN) 300 MG capsule Take 2 capsules at night as needed for restless leg symptoms 180 capsule 3  . icosapent Ethyl (VASCEPA) 1 g capsule TAKE 2 CAPSULES(2 GRAMS) BY MOUTH TWICE DAILY 180 capsule 3  . insulin glargine, 2 Unit Dial, (TOUJEO MAX SOLOSTAR) 300 UNIT/ML Solostar Pen Inject 130 Units into the skin daily. 9 mL 3  . Insulin Pen Needle (NOVOFINE PEN NEEDLE) 32G X 6 MM MISC Use 1-4 times daily as needed for insulin (Patient not taking: Reported on 12/24/2020) 100 each 3  . JANUVIA 100 MG tablet TAKE 1 TABLET(100 MG) BY MOUTH DAILY 90 tablet 1  . JARDIANCE 25 MG TABS tablet TAKE 1 TABLET(25 MG) BY MOUTH DAILY BEFORE AND BREAKFAST 90 tablet 1  . Lancets (ONETOUCH ULTRASOFT) lancets Use 1-4 times daily as needed/directed  DX E11.9 200 each 12  . Lidocaine HCl 4 % CREA Apply topically.    Marland Kitchen losartan (COZAAR) 100 MG tablet TAKE 1 TABLET(100 MG) BY MOUTH DAILY 90 tablet 1  . Magnesium Oxide 400 MG CAPS Take 1 capsule (400 mg total) by mouth daily. 90 capsule 1  . metFORMIN (GLUCOPHAGE-XR) 500 MG 24 hr tablet Take 1 tablet (500 mg total) by mouth in the morning and at  bedtime. 180 tablet 3  . montelukast (SINGULAIR) 10 MG tablet TAKE 1 TABLET BY MOUTH AT BEDTIME 90 tablet 3  . Multiple Vitamin (MULTIVITAMIN WITH MINERALS) TABS tablet Take 1 tablet by mouth daily.    Glory Rosebush VERIO test strip Use 1-4 times daily as directed/needed   DX E11.9 200 each 12  . potassium chloride SA (KLOR-CON) 20 MEQ tablet Take 2 tablets once daily 180 tablet 3  . PSYLLIUM HUSK PO Take by mouth. 2 cap twice daily    . rosuvastatin (CRESTOR) 40 MG tablet Take 1 tablet (40 mg total) by mouth daily. 90 tablet 3  . saccharomyces boulardii (FLORASTOR) 250 MG capsule Take 250 mg by mouth 2 (two) times daily.    Marland Kitchen thiamine (VITAMIN B-1) 100 MG tablet Take  100 mg by mouth daily.     No current facility-administered medications for this encounter.    Allergies  Allergen Reactions  . Xarelto [Rivaroxaban] Other (See Comments)    Patient stated he "ended up in the hospital with a rectal bleed"  . Amoxicillin Rash and Other (See Comments)    * SEVERE RASH IN GROIN AREA Has patient had a PCN reaction causing immediate rash, facial/tongue/throat swelling, SOB or lightheadedness with hypotension: no Has patient had a PCN reaction causing severe rash involving mucus membranes or skin necrosis: no Has patient had a PCN reaction that required hospitalization: no Has patient had a PCN reaction occurring within the last 10 years: yes If all of the above answers are "NO", then may proceed with Cephalosporin use.   . Augmentin [Amoxicillin-Pot Clavulanate] Rash and Other (See Comments)    * SEVERE RASH IN GROIN AREA  . Azithromycin Rash and Other (See Comments)    * SEVERE RASH IN GROIN AREA  . Clindamycin/Lincomycin Rash  . Keflex [Cephalexin] Rash    Social History   Socioeconomic History  . Marital status: Married    Spouse name: Not on file  . Number of children: 0  . Years of education: Not on file  . Highest education level: Not on file  Occupational History  . Occupation:  retired  Tobacco Use  . Smoking status: Former Smoker    Packs/day: 1.00    Years: 7.00    Pack years: 7.00    Types: Cigarettes    Quit date: 10/04/1977    Years since quitting: 43.4  . Smokeless tobacco: Never Used  . Tobacco comment: never smoked over 1 pack   Vaping Use  . Vaping Use: Never used  Substance and Sexual Activity  . Alcohol use: Yes    Alcohol/week: 3.0 - 4.0 standard drinks    Types: 1 Glasses of wine, 2 - 3 Shots of liquor per week    Comment: DAILY  . Drug use: No    Comment: Smoked Marijuana back in 1970s  . Sexual activity: Yes  Other Topics Concern  . 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 Difficulty of Paying Living Expenses: Not hard at all  Food Insecurity: Not on file  Transportation Needs: No Transportation Needs  . Lack of Transportation (Medical): No  . Lack of Transportation (Non-Medical): No  Physical Activity: Inactive  . Days of Exercise per Week: 0 days  . Minutes of Exercise per Session: 0 min  Stress: No Stress Concern Present  . Feeling of Stress : Not at all  Social Connections: Moderately Isolated  . Frequency of Communication with Friends and Family: More than three times a week  . Frequency of Social Gatherings with Friends and Family: Never  . Attends Religious Services: Never  . Active Member of Clubs or Organizations: No  . Attends Archivist Meetings: Never  . Marital Status: Married  Human resources officer Violence: Not At Risk  . Fear of Current or Ex-Partner: No  . Emotionally Abused: No  . Physically Abused: No  . Sexually Abused: No    Family History  Problem Relation Age of Onset  .  Colon cancer Mother   . Cancer Father         UNKNOWN TYPE  . Heart disease Paternal Grandmother   . Heart disease Paternal Grandfather     ROS- All systems are reviewed and negative except as per the HPI above  Physical Exam: Vitals:   03/11/21 1140  Weight: 111.6 kg  Height: _0  (1.778 m)   Wt Readings from Last 3 Encounters:  03/11/21 111.6 kg  12/24/20 104.8 kg  12/22/20 107.3 kg    Labs: Lab Results  Component Value Date   NA 143 12/08/2020   K 3.9 12/08/2020   CL 102 12/08/2020   CO2 22 12/08/2020   GLUCOSE 99 12/08/2020   BUN 12 12/08/2020   CREATININE 0.78 12/08/2020   CALCIUM 9.8 12/08/2020   PHOS 4.0 03/03/2020   MG 1.8 12/08/2020   Lab Results  Component Value Date   INR 1.3 (H) 02/25/2020   Lab Results  Component Value Date   CHOL 137 09/23/2020   HDL 40 09/23/2020   LDLCALC 64 09/23/2020   TRIG 280 (H) 09/23/2020    GEN- The patient is well appearing obese male, alert and oriented x 3 today.   HEENT-head normocephalic, atraumatic, sclera clear, conjunctiva pink, hearing intact, trachea midline. Lungs- Clear to ausculation bilaterally, normal work of breathing Heart- rapid irregular rate and rhythm, no murmurs, rubs or gallops  GI- soft, NT, ND, + BS Extremities- no clubbing, cyanosis, or edema MS- no significant deformity or atrophy Skin- no rash or lesion Psych- euthymic mood, full affect Neuro- strength and sensation are intact   EKG- probable atrial flutter at 142 bpm, qrs int 100 bpm, qtc 529 ms    Epic records reviewed   Assessment and Plan: 1. Persistent afib S/o ablation 03/2018. Reloaded on dofetilide 1/21-1/23/21 Increased a flutter burden over  the last several weeks  Continue dofetilide 125 mcg BID and Cardizem 360 mg daily Tsh/CBC/cmet  today Add metoprolol tartrate 25 mg bid   He will need  a report on Friday to see result and will be reviewed to determine the next step Increased afib burden is not surprising with ongoing alcohol use and untreated sleep apnea    This patients CHA2DS2-VASc Score and unadjusted Ischemic Stroke Rate (% per year) is equal to 4.8 % stroke rate/year from a score of 4  Above score calculated as 1 point each if present [CHF, HTN, DM, Vascular=MI/PAD/Aortic Plaque, Age if 65-74, or Male] Above score calculated as 2 points each if present [Age > 75, or Stroke/TIA/TE]   2. OSA The importance of adequate treatment of sleep apnea was discussed today in order to improve our ability to maintain sinus rhythm long term. Patient not on CPAP therapy. Followed by Dr Radford Pax.  3. CAD Recent atypical chest pain, mostly at rest, relieved after a few minutes without any intervention  Followed by Dr Martinique If discomfort continues,  f/u with Dr. Martinique  4. HTN Elevated today, adding BB hopefully will help with management   5. Obesity Body mass index is 35.3 kg/m. Lifestyle modification was discussed and encouraged including regular physical activity and weight reduction.   I will determine f/u after reviewing the next Lin[facility] report on Friday  If symptoms worsen to the ER.   Geroge Baseman Tayvia Faughnan, [facility] [address] [place], [place] [address] [phone]

## 2021-03-16 ENCOUNTER — Telehealth: Payer: Self-pay | Admitting: Internal Medicine

## 2021-03-16 ENCOUNTER — Emergency Department (HOSPITAL_COMMUNITY): Payer: Medicare Other

## 2021-03-16 ENCOUNTER — Encounter (HOSPITAL_COMMUNITY): Payer: Self-pay

## 2021-03-16 ENCOUNTER — Emergency Department (HOSPITAL_COMMUNITY)
Admission: EM | Admit: 2021-03-16 | Discharge: 2021-03-16 | Disposition: A | Discharge status: Home / Self Care | Payer: Medicare Other | Attending: Emergency Medicine | Admitting: Emergency Medicine

## 2021-03-16 ENCOUNTER — Other Ambulatory Visit: Payer: Self-pay

## 2021-03-16 DIAGNOSIS — Z20822 Contact with and (suspected) exposure to covid-19: Secondary | ICD-10-CM | POA: Insufficient documentation

## 2021-03-16 DIAGNOSIS — R0602 Shortness of breath: Secondary | ICD-10-CM | POA: Diagnosis not present

## 2021-03-16 DIAGNOSIS — R4781 Slurred speech: Secondary | ICD-10-CM | POA: Diagnosis not present

## 2021-03-16 DIAGNOSIS — R000 Tachycardia, unspecified: Secondary | ICD-10-CM | POA: Diagnosis not present

## 2021-03-16 DIAGNOSIS — R29818 Other symptoms and signs involving the nervous system: Secondary | ICD-10-CM | POA: Diagnosis not present

## 2021-03-16 DIAGNOSIS — J449 Chronic obstructive pulmonary disease, unspecified: Secondary | ICD-10-CM | POA: Diagnosis not present

## 2021-03-16 DIAGNOSIS — J45909 Unspecified asthma, uncomplicated: Secondary | ICD-10-CM | POA: Diagnosis not present

## 2021-03-16 DIAGNOSIS — I4811 Longstanding persistent atrial fibrillation: Secondary | ICD-10-CM | POA: Diagnosis not present

## 2021-03-16 DIAGNOSIS — E1169 Type 2 diabetes mellitus with other specified complication: Secondary | ICD-10-CM | POA: Diagnosis not present

## 2021-03-16 DIAGNOSIS — Z7901 Long term (current) use of anticoagulants: Secondary | ICD-10-CM | POA: Diagnosis not present

## 2021-03-16 DIAGNOSIS — Y900 Blood alcohol level of less than 20 mg/100 ml: Secondary | ICD-10-CM | POA: Insufficient documentation

## 2021-03-16 DIAGNOSIS — R9431 Abnormal electrocardiogram [ECG] [EKG]: Secondary | ICD-10-CM | POA: Diagnosis not present

## 2021-03-16 DIAGNOSIS — R531 Weakness: Secondary | ICD-10-CM | POA: Diagnosis not present

## 2021-03-16 DIAGNOSIS — Z96643 Presence of artificial hip joint, bilateral: Secondary | ICD-10-CM | POA: Diagnosis not present

## 2021-03-16 DIAGNOSIS — Z79899 Other long term (current) drug therapy: Secondary | ICD-10-CM | POA: Diagnosis not present

## 2021-03-16 DIAGNOSIS — J019 Acute sinusitis, unspecified: Secondary | ICD-10-CM | POA: Diagnosis not present

## 2021-03-16 DIAGNOSIS — Z951 Presence of aortocoronary bypass graft: Secondary | ICD-10-CM | POA: Insufficient documentation

## 2021-03-16 DIAGNOSIS — Z955 Presence of coronary angioplasty implant and graft: Secondary | ICD-10-CM | POA: Insufficient documentation

## 2021-03-16 DIAGNOSIS — I10 Essential (primary) hypertension: Secondary | ICD-10-CM | POA: Insufficient documentation

## 2021-03-16 DIAGNOSIS — Z85828 Personal history of other malignant neoplasm of skin: Secondary | ICD-10-CM | POA: Insufficient documentation

## 2021-03-16 DIAGNOSIS — E785 Hyperlipidemia, unspecified: Secondary | ICD-10-CM | POA: Insufficient documentation

## 2021-03-16 DIAGNOSIS — G319 Degenerative disease of nervous system, unspecified: Secondary | ICD-10-CM | POA: Diagnosis not present

## 2021-03-16 DIAGNOSIS — I2510 Atherosclerotic heart disease of native coronary artery without angina pectoris: Secondary | ICD-10-CM | POA: Diagnosis not present

## 2021-03-16 DIAGNOSIS — I517 Cardiomegaly: Secondary | ICD-10-CM | POA: Diagnosis not present

## 2021-03-16 DIAGNOSIS — I4891 Unspecified atrial fibrillation: Secondary | ICD-10-CM | POA: Diagnosis not present

## 2021-03-16 LAB — COMPREHENSIVE METABOLIC PANEL
ALT: 31 U/L (ref 0–44)
AST: 31 U/L (ref 15–41)
Albumin: 3.4 g/dL — ABNORMAL LOW (ref 3.5–5.0)
Alkaline Phosphatase: 51 U/L (ref 38–126)
Anion gap: 13 (ref 5–15)
BUN: 12 mg/dL (ref 8–23)
CO2: 19 mmol/L — ABNORMAL LOW (ref 22–32)
Calcium: 8.9 mg/dL (ref 8.9–10.3)
Chloride: 105 mmol/L (ref 98–111)
Creatinine, Ser: 0.72 mg/dL (ref 0.61–1.24)
GFR, Estimated: >60 mL/min (ref >60)
Glucose, Bld: 100 mg/dL — ABNORMAL HIGH (ref 70–99)
Potassium: 3.4 mmol/L — ABNORMAL LOW (ref 3.5–5.1)
Sodium: 137 mmol/L (ref 135–145)
Total Bilirubin: 1.1 mg/dL (ref 0.3–1.2)
Total Protein: 6.6 g/dL (ref 6.5–8.1)

## 2021-03-16 LAB — I-STAT CHEM 8, ED
BUN: 11 mg/dL (ref 8–23)
Calcium, Ion: 1.18 mmol/L (ref 1.15–1.40)
Chloride: 104 mmol/L (ref 98–111)
Creatinine, Ser: 0.6 mg/dL — ABNORMAL LOW (ref 0.61–1.24)
Glucose, Bld: 103 mg/dL — ABNORMAL HIGH (ref 70–99)
HCT: 34 % — ABNORMAL LOW (ref 39.0–52.0)
Hemoglobin: 11.6 g/dL — ABNORMAL LOW (ref 13.0–17.0)
Potassium: 3.4 mmol/L — ABNORMAL LOW (ref 3.5–5.1)
Sodium: 138 mmol/L (ref 135–145)
TCO2: 21 mmol/L — ABNORMAL LOW (ref 22–32)

## 2021-03-16 LAB — ETHANOL: Alcohol, Ethyl (B): 12 mg/dL — ABNORMAL HIGH (ref <10)

## 2021-03-16 LAB — CBC
HCT: 36.2 % — ABNORMAL LOW (ref 39.0–52.0)
Hemoglobin: 12.1 g/dL — ABNORMAL LOW (ref 13.0–17.0)
MCH: 31.9 pg (ref 26.0–34.0)
MCHC: 33.4 g/dL (ref 30.0–36.0)
MCV: 95.5 fL (ref 80.0–100.0)
Platelets: 182 10*3/uL (ref 150–400)
RBC: 3.79 MIL/uL — ABNORMAL LOW (ref 4.22–5.81)
RDW: 14.7 % (ref 11.5–15.5)
WBC: 7.6 10*3/uL (ref 4.0–10.5)
nRBC: 0 % (ref 0.0–0.2)

## 2021-03-16 LAB — LIPASE, BLOOD: Lipase: 22 U/L (ref 11–51)

## 2021-03-16 LAB — DIFFERENTIAL
Abs Immature Granulocytes: 0.21 10*3/uL — ABNORMAL HIGH (ref 0.00–0.07)
Basophils Absolute: 0.1 10*3/uL (ref 0.0–0.1)
Basophils Relative: 1 %
Eosinophils Absolute: 0.1 10*3/uL (ref 0.0–0.5)
Eosinophils Relative: 1 %
Immature Granulocytes: 3 %
Lymphocytes Relative: 17 %
Lymphs Abs: 1.3 10*3/uL (ref 0.7–4.0)
Monocytes Absolute: 1.3 10*3/uL — ABNORMAL HIGH (ref 0.1–1.0)
Monocytes Relative: 17 %
Neutro Abs: 4.6 10*3/uL (ref 1.7–7.7)
Neutrophils Relative %: 61 %

## 2021-03-16 LAB — RESP PANEL BY RT-PCR (FLU A&B, COVID) ARPGX2
Influenza A by PCR: NEGATIVE
Influenza B by PCR: NEGATIVE
SARS Coronavirus 2 by RT PCR: NEGATIVE

## 2021-03-16 LAB — PROTIME-INR
INR: 1.2 (ref 0.8–1.2)
Prothrombin Time: 14.9 seconds (ref 11.4–15.2)

## 2021-03-16 LAB — VALPROIC ACID LEVEL: Valproic Acid Lvl: <10 ug/mL — ABNORMAL LOW (ref 50.0–100.0)

## 2021-03-16 LAB — APTT: aPTT: 37 seconds — ABNORMAL HIGH (ref 24–36)

## 2021-03-16 NOTE — Discharge Instructions (Addendum)
Make an appointment [facility]up with [facility] and your regular doctor.  Today's work-up to include MRI brain without evidence of any acute stroke.  Labs without significant abnormality.  Chest x-ray clear.  EKG consistent with atrial fibrillation but rate controlled.  Continue your medicines.

## 2021-03-16 NOTE — ED Notes (Signed)
Patient transported to MRI 

## 2021-03-16 NOTE — Telephone Encounter (Signed)
Pt c/o of Chest Pain: STAT if CP now or developed within 24 hours  1. Are you having CP right now? No   2. Are you experiencing any other symptoms (ex. SOB, nausea, vomiting, sweating)?  SOB   3. How long have you been experiencing CP? Past few days   4. Is your CP continuous or coming and going?   5. Have you taken Nitroglycerin? No, patient states he does not have any nitro     Pt c/o Shortness Of Breath: STAT if SOB developed within the last 24 hours or pt is noticeably SOB on the phone  1. Are you currently SOB (can you hear that pt is SOB on the phone)? Yes  2. How long have you been experiencing SOB? Past few days   3. Are you SOB when sitting or when up moving around? Both   4. Are you currently experiencing any other symptoms? Not currently, but he is having chest pain/tightness on and off   ?

## 2021-03-16 NOTE — Telephone Encounter (Signed)
Called pt back in regards to SOB.  Pt is noticeably SOB can barely answer questions.  I asked if anyone was home with him; he reports wife is with him.  I asked to speak with her.  She reports that she has tried to convince pt to go to the ED all weekend but he has refused.  She also states that he can not stay awake.  I directed her to call 911 to be evaluated in the ED.  She is agreeable to plan and will call 911.  She asked if Dr. Rayann Heman will meet pt at hospital.  I informed her that he is in clinic today but a provider will see him at the hospital.  She thanked me for call. Will route to MD and Nurse.

## 2021-03-16 NOTE — ED Notes (Signed)
Wife updated.  Will come pick up pt.

## 2021-03-16 NOTE — ED Notes (Signed)
Dc instructions reviewed with pt. Pt verbalized understanding. PT DC>  

## 2021-03-16 NOTE — ED Triage Notes (Addendum)
Pt bib [facility] from home with complaints of shob, afib, generalized weakness x 5 days. Pt also has slurred speech along with increased lethargy x 5 days.  Pt states 2-3 shots of scotch a day. Last drink this morning at 8am EMS vitals: 132/76,93% RA, 26 R, 107 CBG

## 2021-03-16 NOTE — ED Provider Notes (Signed)
[place] [facility] EMERGENCY DEPARTMENT Provider Note   CSN: [id number] Arrival date & time: 03/16/21  1051     History No chief complaint on file.   Adam [name] is a 70 y.o. male.  Patient brought in by EMS.  History of atrial fibrillation alcohol abuse is on Eliquis.  Slurred speech and right facial droop with slurred speech supposedly present for the past 5 days.  Patient denies any visual changes seems to have no extremity weakness.  Saw cardiology on June 8.  Patient is on calcium channel blocker.  And states he is taking his Eliquis.  Patient states he still drinks.  Patient also has been evaluated for Warnicke's encephalopathy before.  Also has a history of hypertension coronary artery disease COPD.  Obstructive sleep apnea but cannot tolerate CPAP.      Past Medical History:  Diagnosis Date   Allergy    Anxiety    history of PTSD following CABG   Ascending aortic aneurysm ([place]) 01/31/2018   43 x 42 mm, pt unaware   Asthma    Cardiomegaly 10/17/2017   Colitis- colonoscopy 2014 July 25, 2015   COPD (chronic obstructive pulmonary disease) ([place])    Coronary artery disease    x 6   Depression    Diabetes mellitus without complication ([place])    Family history of polyps in the colon    Finger dislocation    Left pinkie   GERD (gastroesophageal reflux disease)    Gout    H/O atrial fibrillation without current medication    following CABG with no documented episodes since then.   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    Patient Active Problem List   Diagnosis Date Noted   Hypertension 06/05/2020   Protein-calorie malnutrition, severe 03/01/2020   Severe malnutrition ([place]) 02/28/2020   Pressure injury of  skin 02/21/2020   Rapid atrial fibrillation ([place]) 02/20/2020   Atrial fibrillation with RVR ([place]) 02/08/2020   High anion gap metabolic acidosis 80/99/8338   Secondary hypercoagulable state ([place]) 11/02/2019   Alcohol withdrawal ([place]) 10/24/2019   Paroxysmal atrial fibrillation ([place]) 10/24/2019   Rectal bleeding 10/24/2019   Degeneration of lumbar intervertebral disc 09/27/2019   Thoracic aortic aneurysm ([place]) 02/08/2019   Hemoptysis 10/06/2018   S/P right THA, 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   S/P hip replacement 09/12/2018   Low back pain 05/19/2018   Sleep apnea 01/09/2018   Persistent atrial fibrillation 12/12/2017   Allergic rhinitis 08/22/2015   Family history of colon cancer in mother deceased age 52 07-25-2015   Type 2 diabetes mellitus, controlled ([place]) 07/17/2013   COPD (chronic obstructive pulmonary disease) ([place]) 07/05/2013   CAD (coronary artery disease) 06/26/2013   Hypertension associated with diabetes ([place]) 06/26/2013   Hyperlipidemia associated with type 2 diabetes mellitus ([place]) 06/26/2013   GERD (gastroesophageal reflux disease) 06/26/2013   History of atrial fibrillation without current medication 06/26/2013   Gout 06/26/2013   Osteoarthritis 06/26/2013   Asthma, mild persistent 06/26/2013   Alcohol abuse 06/26/2013   PTSD (post-traumatic stress disorder) 06/26/2013   HLA B27 (HLA B27 positive) 06/26/2013   Obesity (BMI 30-39.9) 06/26/2013   Benign essential hypertension 06/26/2013    Past  Surgical History:  Procedure Laterality Date   ANKLE FRACTURE SURGERY Right 1991   APPENDECTOMY     ATRIAL FIBRILLATION ABLATION N/A 03/31/2018   Procedure: ATRIAL FIBRILLATION ABLATION;  Surgeon: [name], MD;  Location: [facility];  Service: Cardiovascular;  Laterality: N/A;   CARDIAC ELECTROPHYSIOLOGY MAPPING AND ABLATION     COLONOSCOPY W/ BIOPSIES  2017   x7   CORONARY ANGIOPLASTY WITH STENT PLACEMENT     CORONARY ARTERY BYPASS GRAFT     FINGER SURGERY  04/2018    Small finger left hand   implantable loop recorder placement  07/02/2019   Medtronic Reveal [place] model LNQ11 (SN [id number]) implanted in office by Dr Rayann Heman   TEE WITHOUT CARDIOVERSION N/A 03/30/2018   Procedure: TRANSESOPHAGEAL ECHOCARDIOGRAM (TEE);  Surgeon: [name], MD;  Location: [facility];  Service: Cardiovascular;  Laterality: N/A;   TOTAL HIP ARTHROPLASTY Left    TOTAL HIP ARTHROPLASTY Right 09/12/2018   Procedure: TOTAL HIP ARTHROPLASTY ANTERIOR APPROACH;  Surgeon: [name], MD;  Location: [facility];  Service: Orthopedics;  Laterality: Right;  70 mins       Family History  Problem Relation Age of Onset   Colon cancer Mother    Cancer Father        UNKNOWN TYPE   Heart disease Paternal Grandmother    Heart disease Paternal Grandfather     Social History   Tobacco Use   Smoking status: Former    Packs/day: 1.00    Years: 7.00    Pack years: 7.00    Types: Cigarettes    Quit date: 10/04/1977    Years since quitting: 43.4   Smokeless tobacco: Never   Tobacco comments:    never smoked over 1 pack   Vaping Use   Vaping Use: Never used  Substance Use Topics   Alcohol use: Yes    Alcohol/week: 3.0 - 4.0 standard drinks    Types: 1 Glasses of wine, 2 - 3 Shots of liquor per week    Comment: DAILY   Drug use: No    Comment: Smoked Marijuana back in 1970s    Home Medications Prior to Admission medications   Medication Sig Start Date End Date Taking? Authorizing Provider  acetaminophen (TYLENOL) 325 MG tablet Take 325 mg by mouth every 8 (eight) hours as needed.    [provider]  allopurinol (ZYLOPRIM) 300 MG tablet TAKE 1 TABLET(300 MG) BY MOUTH DAILY 12/05/20   [name], Alinda Sierras, MD  B COMPLEX-C-FOLIC ACID PO Take by mouth daily.    [provider]  Blood Glucose Monitoring Suppl (ONETOUCH VERIO) w/Device KIT Use 1-4 times daily as needed/directed DX E11.9 09/29/20   [name], Alinda Sierras, MD  diltiazem (CARDIZEM CD) 360 MG 24 hr capsule  Take 1 capsule (360 mg total) by mouth daily. 09/09/20   [name], Alinda Sierras, MD  divalproex (DEPAKOTE SPRINKLE) 125 MG capsule Take 1 capsule (125 mg total) by mouth 3 (three) times daily. Patient taking differently: Take 125 mg by mouth 2 (two) times daily. 09/15/20   [name], Alinda Sierras, MD  dofetilide (TIKOSYN) 125 MCG capsule Take 1 capsule (125 mcg total) by mouth 2 (two) times daily. 11/17/20   Allred, Jeneen Rinks, MD  DULoxetine (CYMBALTA) 60 MG capsule TAKE 1 CAPSULE(60 MG) BY MOUTH DAILY 11/06/20   [name], Alinda Sierras, MD  ELIQUIS 5 MG TABS tablet TAKE 1 TABLET(5 MG) BY MOUTH TWICE DAILY 03/04/21   [name], Alinda Sierras, MD  Fluticasone-Umeclidin-Vilant (TRELEGY ELLIPTA) 100-62.5-25 MCG/INH  AEPB Inhale 1 puff into the lungs daily. 11/11/20   [name], MD  gabapentin (NEURONTIN) 300 MG capsule Take 2 capsules at night as needed for restless leg symptoms 09/23/20   [name], MD  icosapent Ethyl (VASCEPA) 1 g capsule TAKE 2 CAPSULES(2 GRAMS) BY MOUTH TWICE DAILY Patient taking differently: TAKE 2 CAPSULES(2 GRAMS) BY MOUTH DAILY 12/30/20   [place], [name], MD  insulin glargine, 2 Unit Dial, (TOUJEO MAX SOLOSTAR) 300 UNIT/ML Solostar Pen Inject 130 Units into the skin daily. 03/05/21   [name], Alinda Sierras, MD  Insulin Pen Needle (NOVOFINE PEN NEEDLE) 32G X 6 MM MISC Use 1-4 times daily as needed for insulin 09/15/20   [name], Alinda Sierras, MD  JANUVIA 100 MG tablet TAKE 1 TABLET(100 MG) BY MOUTH DAILY 12/05/20   [name], Alinda Sierras, MD  JARDIANCE 25 MG TABS tablet TAKE 1 TABLET(25 MG) BY MOUTH DAILY BEFORE AND BREAKFAST 03/04/21   [name], Alinda Sierras, MD  Lancets [facility] ULTRASOFT) lancets Use 1-4 times daily as needed/directed  DX E11.9 09/29/20   [name], Alinda Sierras, MD  Lidocaine HCl 4 % CREA Apply topically as needed.    [provider]  losartan (COZAAR) 100 MG tablet TAKE 1 TABLET(100 MG) BY MOUTH DAILY 03/04/21   [name], Alinda Sierras, MD  Magnesium Oxide 400 MG CAPS Take 1 capsule (400 mg total)  by mouth daily. 09/15/20   [name], Alinda Sierras, MD  metFORMIN (GLUCOPHAGE-XR) 500 MG 24 hr tablet Take 1 tablet (500 mg total) by mouth in the morning and at bedtime. 09/23/20   [name], Alinda Sierras, MD  metoprolol tartrate (LOPRESSOR) 25 MG tablet Take 1 tablet (25 mg total) by mouth 2 (two) times daily. 03/11/21 03/11/22  [name], NP  montelukast (SINGULAIR) 10 MG tablet TAKE 1 TABLET BY MOUTH AT BEDTIME 12/05/20   [name], Alinda Sierras, MD  Multiple Vitamin (MULTIVITAMIN WITH MINERALS) TABS tablet Take 1 tablet by mouth daily. 03/04/20   Mercy Riding, MD  [facility] VERIO test strip Use 1-4 times daily as directed/needed   DX E11.9 09/29/20   [name], Alinda Sierras, MD  potassium chloride SA (KLOR-CON) 20 MEQ tablet Take 2 tablets once daily 09/23/20   [name], Alinda Sierras, MD  PSYLLIUM HUSK PO Take by mouth. 2 cap twice daily    [provider]  rosuvastatin (CRESTOR) 40 MG tablet Take 1 tablet (40 mg total) by mouth daily. 09/09/20 12/08/20  [name], Alinda Sierras, MD  thiamine (VITAMIN B-1) 100 MG tablet Take 500 mg by mouth daily.    [provider]    Allergies    Xarelto [rivaroxaban], Amoxicillin, Augmentin [amoxicillin-pot clavulanate], Azithromycin, Clindamycin/lincomycin, and Keflex [cephalexin]  Review of Systems   Review of Systems  Constitutional:  Negative for chills and fever.  HENT:  Negative for congestion, ear pain and sore throat.   Eyes:  Negative for pain and visual disturbance.  Respiratory:  Positive for shortness of breath. Negative for cough.   Cardiovascular:  Negative for chest pain and palpitations.  Gastrointestinal:  Negative for abdominal pain and vomiting.  Genitourinary:  Negative for dysuria and hematuria.  Musculoskeletal:  Negative for arthralgias and back pain.  Skin:  Negative for color change and rash.  Neurological:  Positive for speech difficulty. Negative for seizures and syncope.  All other systems reviewed and are negative.  Physical  Exam Updated Vital Signs BP (!) 149/86 (BP Location: Right Arm)   Pulse 91   Temp 98.2 F (36.8 C) (  Oral)   Resp 20   Ht 1.778 m (_0 )   Wt 111.6 kg   SpO2 100%   BMI 35.30 kg/m   Physical Exam Vitals and nursing note reviewed.  Constitutional:      General: He is not in acute distress.    Appearance: Normal appearance. He is well-developed.  HENT:     Head: Normocephalic and atraumatic.  Eyes:     Extraocular Movements: Extraocular movements intact.     Conjunctiva/sclera: Conjunctivae normal.     Pupils: Pupils are equal, round, and reactive to light.  Cardiovascular:     Rate and Rhythm: Normal rate and regular rhythm.     Heart sounds: No murmur heard. Pulmonary:     Effort: Pulmonary effort is normal. No respiratory distress.     Breath sounds: Normal breath sounds. No wheezing or rales.  Abdominal:     Palpations: Abdomen is soft.     Tenderness: There is no abdominal tenderness.  Musculoskeletal:        General: No swelling.     Cervical back: Neck supple.  Skin:    General: Skin is warm and dry.  Neurological:     Mental Status: He is alert and oriented to person, place, and time.     Cranial Nerves: Cranial nerve deficit present.     Sensory: No sensory deficit.     Motor: No weakness.     Comments: Patient appeared to have subtle right facial droop.  And patient seem to have some slurring of speech.    ED Results / Procedures / Treatments   Labs (all labs ordered are listed, but only abnormal results are displayed) Labs Reviewed - No data to display  EKG None  Radiology No results found.  Procedures Procedures CRITICAL CARE Performed by: Fredia Sorrow Total critical care time: 45 minutes Critical care time was exclusive of separately billable procedures and treating other patients. Critical care was necessary to treat or prevent imminent or life-threatening deterioration. Critical care was time spent personally by me on the following  activities: development of treatment plan with patient and/or surrogate as well as nursing, discussions with consultants, evaluation of patient's response to treatment, examination of patient, obtaining history from patient or surrogate, ordering and performing treatments and interventions, ordering and review of laboratory studies, ordering and review of radiographic studies, pulse oximetry and re-evaluation of patient's condition.   Medications Ordered in ED Medications - No data to display  ED Course  I have reviewed the triage vital signs and the nursing notes.  Pertinent labs & imaging results that were available during my care of the patient were reviewed by me and considered in my medical decision making (see chart for details).    MDM Rules/Calculators/A&P                          Patient oxygen saturations were 100% he was on 2 L.  Off oxygen he was in the 90s.  Patient known to have COPD.  Heart rate 91 blood pressure 149/86 afebrile temp 98.2.   Patient not a candidate for tPA for concerns for stroke.  But stroke order set was initiated.  Head CT showed no acute findings.  MRI was negative.  Which is very reassuring.  Patient did have a little bit of alcohol on his system but he was not significantly intoxicated.  Showing no signs of withdrawal.  Sodium normal potassium a little low at 3.4.  COVID testing and influenza testing negative.  Chest x-ray without acute findings.  EKG consistent with rate control atrial fib.  Patient's work-up here without evidence of acute stroke.  No evidence of any tumors.  Not sure if the slurred speech is based from long years use of alcohol.  But no acute reason for admission.  Atrial fibrillation is under control.  Can follow-up with cardiology.  Referral given to neurology.   Final Clinical Impression(s) / ED Diagnoses Final diagnoses:  None    Rx / DC Orders ED Discharge Orders     None        Fredia Sorrow, MD 03/16/21 1616

## 2021-03-17 ENCOUNTER — Other Ambulatory Visit (HOSPITAL_COMMUNITY): Payer: Self-pay | Admitting: *Deleted

## 2021-03-17 MED ORDER — METOPROLOL TARTRATE 50 MG PO TABS: 50.0000 mg | ORAL_TABLET | Freq: Two times a day (BID) | ORAL | 3 refills | Status: DC

## 2021-03-19 ENCOUNTER — Other Ambulatory Visit: Payer: Self-pay | Admitting: Family Medicine

## 2021-03-20 ENCOUNTER — Encounter: Payer: Self-pay | Admitting: Family Medicine

## 2021-03-20 ENCOUNTER — Ambulatory Visit (INDEPENDENT_AMBULATORY_CARE_PROVIDER_SITE_OTHER): Payer: Medicare Other | Admitting: Family Medicine

## 2021-03-20 ENCOUNTER — Inpatient Hospital Stay (HOSPITAL_COMMUNITY)
Admission: EM | Admit: 2021-03-20 | Discharge: 2021-03-24 | DRG: 309 | Disposition: A | Discharge status: Home / Self Care | Payer: Medicare Other | Source: Ambulatory Visit | Attending: Internal Medicine | Admitting: Internal Medicine

## 2021-03-20 ENCOUNTER — Emergency Department (HOSPITAL_COMMUNITY): Payer: Medicare Other

## 2021-03-20 ENCOUNTER — Encounter (HOSPITAL_COMMUNITY): Payer: Self-pay | Admitting: Emergency Medicine

## 2021-03-20 ENCOUNTER — Other Ambulatory Visit: Payer: Self-pay

## 2021-03-20 VITALS — BP 150/90 | HR 146 | Temp 97.8°F | Wt 244.8 lb

## 2021-03-20 DIAGNOSIS — I081 Rheumatic disorders of both mitral and tricuspid valves: Secondary | ICD-10-CM | POA: Diagnosis not present

## 2021-03-20 DIAGNOSIS — F32A Depression, unspecified: Secondary | ICD-10-CM | POA: Diagnosis present

## 2021-03-20 DIAGNOSIS — Z79899 Other long term (current) drug therapy: Secondary | ICD-10-CM

## 2021-03-20 DIAGNOSIS — I4819 Other persistent atrial fibrillation: Secondary | ICD-10-CM

## 2021-03-20 DIAGNOSIS — Z6834 Body mass index (BMI) 34.0-34.9, adult: Secondary | ICD-10-CM

## 2021-03-20 DIAGNOSIS — T461X6A Underdosing of calcium-channel blockers, initial encounter: Secondary | ICD-10-CM | POA: Diagnosis present

## 2021-03-20 DIAGNOSIS — G4733 Obstructive sleep apnea (adult) (pediatric): Secondary | ICD-10-CM | POA: Diagnosis present

## 2021-03-20 DIAGNOSIS — F39 Unspecified mood [affective] disorder: Secondary | ICD-10-CM | POA: Diagnosis not present

## 2021-03-20 DIAGNOSIS — Z7984 Long term (current) use of oral hypoglycemic drugs: Secondary | ICD-10-CM

## 2021-03-20 DIAGNOSIS — Z955 Presence of coronary angioplasty implant and graft: Secondary | ICD-10-CM

## 2021-03-20 DIAGNOSIS — I10 Essential (primary) hypertension: Secondary | ICD-10-CM

## 2021-03-20 DIAGNOSIS — Z87891 Personal history of nicotine dependence: Secondary | ICD-10-CM

## 2021-03-20 DIAGNOSIS — R4781 Slurred speech: Secondary | ICD-10-CM

## 2021-03-20 DIAGNOSIS — Z20822 Contact with and (suspected) exposure to covid-19: Secondary | ICD-10-CM | POA: Diagnosis present

## 2021-03-20 DIAGNOSIS — Z96643 Presence of artificial hip joint, bilateral: Secondary | ICD-10-CM | POA: Diagnosis present

## 2021-03-20 DIAGNOSIS — E785 Hyperlipidemia, unspecified: Secondary | ICD-10-CM | POA: Diagnosis present

## 2021-03-20 DIAGNOSIS — Z7901 Long term (current) use of anticoagulants: Secondary | ICD-10-CM

## 2021-03-20 DIAGNOSIS — R000 Tachycardia, unspecified: Secondary | ICD-10-CM

## 2021-03-20 DIAGNOSIS — E876 Hypokalemia: Secondary | ICD-10-CM | POA: Diagnosis present

## 2021-03-20 DIAGNOSIS — F10239 Alcohol dependence with withdrawal, unspecified: Secondary | ICD-10-CM | POA: Diagnosis present

## 2021-03-20 DIAGNOSIS — Z951 Presence of aortocoronary bypass graft: Secondary | ICD-10-CM

## 2021-03-20 DIAGNOSIS — Z8249 Family history of ischemic heart disease and other diseases of the circulatory system: Secondary | ICD-10-CM

## 2021-03-20 DIAGNOSIS — I2510 Atherosclerotic heart disease of native coronary artery without angina pectoris: Secondary | ICD-10-CM | POA: Diagnosis present

## 2021-03-20 DIAGNOSIS — F1010 Alcohol abuse, uncomplicated: Secondary | ICD-10-CM | POA: Diagnosis present

## 2021-03-20 DIAGNOSIS — I5032 Chronic diastolic (congestive) heart failure: Secondary | ICD-10-CM | POA: Diagnosis present

## 2021-03-20 DIAGNOSIS — J449 Chronic obstructive pulmonary disease, unspecified: Secondary | ICD-10-CM | POA: Diagnosis not present

## 2021-03-20 DIAGNOSIS — Z781 Physical restraint status: Secondary | ICD-10-CM | POA: Diagnosis not present

## 2021-03-20 DIAGNOSIS — Z881 Allergy status to other antibiotic agents status: Secondary | ICD-10-CM

## 2021-03-20 DIAGNOSIS — Z7951 Long term (current) use of inhaled steroids: Secondary | ICD-10-CM

## 2021-03-20 DIAGNOSIS — G510 Bell's palsy: Secondary | ICD-10-CM | POA: Diagnosis present

## 2021-03-20 DIAGNOSIS — I4891 Unspecified atrial fibrillation: Secondary | ICD-10-CM | POA: Diagnosis not present

## 2021-03-20 DIAGNOSIS — E1169 Type 2 diabetes mellitus with other specified complication: Secondary | ICD-10-CM | POA: Diagnosis not present

## 2021-03-20 DIAGNOSIS — Z800 Family history of malignant neoplasm of digestive organs: Secondary | ICD-10-CM

## 2021-03-20 DIAGNOSIS — D72829 Elevated white blood cell count, unspecified: Secondary | ICD-10-CM | POA: Diagnosis present

## 2021-03-20 DIAGNOSIS — I4892 Unspecified atrial flutter: Secondary | ICD-10-CM | POA: Diagnosis present

## 2021-03-20 DIAGNOSIS — Z794 Long term (current) use of insulin: Secondary | ICD-10-CM

## 2021-03-20 DIAGNOSIS — Z880 Allergy status to penicillin: Secondary | ICD-10-CM

## 2021-03-20 DIAGNOSIS — I480 Paroxysmal atrial fibrillation: Principal | ICD-10-CM | POA: Diagnosis present

## 2021-03-20 DIAGNOSIS — I340 Nonrheumatic mitral (valve) insufficiency: Secondary | ICD-10-CM | POA: Diagnosis not present

## 2021-03-20 DIAGNOSIS — M109 Gout, unspecified: Secondary | ICD-10-CM | POA: Diagnosis present

## 2021-03-20 DIAGNOSIS — F4310 Post-traumatic stress disorder, unspecified: Secondary | ICD-10-CM | POA: Diagnosis not present

## 2021-03-20 DIAGNOSIS — R0602 Shortness of breath: Secondary | ICD-10-CM | POA: Diagnosis not present

## 2021-03-20 DIAGNOSIS — I5033 Acute on chronic diastolic (congestive) heart failure: Secondary | ICD-10-CM | POA: Diagnosis not present

## 2021-03-20 DIAGNOSIS — I110 Hypertensive heart disease with heart failure: Secondary | ICD-10-CM | POA: Diagnosis not present

## 2021-03-20 DIAGNOSIS — Z91138 Patient's unintentional underdosing of medication regimen for other reason: Secondary | ICD-10-CM

## 2021-03-20 LAB — CBC
HCT: 39.3 % (ref 39.0–52.0)
Hemoglobin: 13 g/dL (ref 13.0–17.0)
MCH: 31.6 pg (ref 26.0–34.0)
MCHC: 33.1 g/dL (ref 30.0–36.0)
MCV: 95.6 fL (ref 80.0–100.0)
Platelets: 184 10*3/uL (ref 150–400)
RBC: 4.11 MIL/uL — ABNORMAL LOW (ref 4.22–5.81)
RDW: 15 % (ref 11.5–15.5)
WBC: 6.9 10*3/uL (ref 4.0–10.5)
nRBC: 0 % (ref 0.0–0.2)

## 2021-03-20 LAB — BASIC METABOLIC PANEL
Anion gap: 9 (ref 5–15)
BUN: 9 mg/dL (ref 8–23)
CO2: 23 mmol/L (ref 22–32)
Calcium: 9.5 mg/dL (ref 8.9–10.3)
Chloride: 109 mmol/L (ref 98–111)
Creatinine, Ser: 0.77 mg/dL (ref 0.61–1.24)
GFR, Estimated: >60 mL/min (ref >60)
Glucose, Bld: 119 mg/dL — ABNORMAL HIGH (ref 70–99)
Potassium: 3.8 mmol/L (ref 3.5–5.1)
Sodium: 141 mmol/L (ref 135–145)

## 2021-03-20 LAB — GLUCOSE, CAPILLARY
Glucose-Capillary: 119 mg/dL — ABNORMAL HIGH (ref 70–99)
Glucose-Capillary: 128 mg/dL — ABNORMAL HIGH (ref 70–99)

## 2021-03-20 LAB — TROPONIN I (HIGH SENSITIVITY)
Troponin I (High Sensitivity): 15 ng/L (ref <18)
Troponin I (High Sensitivity): 16 ng/L (ref <18)

## 2021-03-20 LAB — ETHANOL: Alcohol, Ethyl (B): <10 mg/dL (ref <10)

## 2021-03-20 LAB — TSH: TSH: 2.034 u[IU]/mL (ref 0.350–4.500)

## 2021-03-20 LAB — HIV ANTIBODY (ROUTINE TESTING W REFLEX): HIV Screen 4th Generation wRfx: NONREACTIVE

## 2021-03-20 LAB — PHOSPHORUS: Phosphorus: 3.2 mg/dL (ref 2.5–4.6)

## 2021-03-20 LAB — BRAIN NATRIURETIC PEPTIDE: B Natriuretic Peptide: 286.3 pg/mL — ABNORMAL HIGH (ref 0.0–100.0)

## 2021-03-20 LAB — SARS CORONAVIRUS 2 (TAT 6-24 HRS): SARS Coronavirus 2: NEGATIVE

## 2021-03-20 LAB — MAGNESIUM: Magnesium: 1.9 mg/dL (ref 1.7–2.4)

## 2021-03-20 MED ORDER — FLUTICASONE FUROATE-VILANTEROL 100-25 MCG/INH IN AEPB
1.0000 | INHALATION_SPRAY | Freq: Every day | RESPIRATORY_TRACT | Status: DC
  Administered 2021-03-21 – 2021-03-24 (×4): 1 via RESPIRATORY_TRACT
  Filled 2021-03-20: qty 28

## 2021-03-20 MED ORDER — AMIODARONE HCL 200 MG PO TABS
400.0000 mg | ORAL_TABLET | Freq: Two times a day (BID) | ORAL | Status: DC
  Administered 2021-03-20 – 2021-03-24 (×8): 400 mg via ORAL
  Filled 2021-03-20 (×8): qty 2

## 2021-03-20 MED ORDER — ICOSAPENT ETHYL 1 G PO CAPS
2.0000 g | ORAL_CAPSULE | Freq: Two times a day (BID) | ORAL | Status: DC
  Administered 2021-03-20 – 2021-03-24 (×8): 2 g via ORAL
  Filled 2021-03-20 (×10): qty 2

## 2021-03-20 MED ORDER — EMPAGLIFLOZIN 25 MG PO TABS
25.0000 mg | ORAL_TABLET | Freq: Every day | ORAL | Status: DC
  Administered 2021-03-21 – 2021-03-24 (×4): 25 mg via ORAL
  Filled 2021-03-20 (×4): qty 1

## 2021-03-20 MED ORDER — DILTIAZEM HCL-DEXTROSE 125-5 MG/125ML-% IV SOLN (PREMIX)
5.0000 mg/h | INTRAVENOUS | Status: DC
  Administered 2021-03-20: 5 mg/h via INTRAVENOUS
  Administered 2021-03-20 – 2021-03-22 (×4): 15 mg/h via INTRAVENOUS
  Administered 2021-03-22: 10 mg/h via INTRAVENOUS
  Administered 2021-03-22 – 2021-03-23 (×2): 12.5 mg/h via INTRAVENOUS
  Filled 2021-03-20 (×10): qty 125

## 2021-03-20 MED ORDER — METOPROLOL TARTRATE 5 MG/5ML IV SOLN
5.0000 mg | Freq: Once | INTRAVENOUS | Status: AC
  Administered 2021-03-20: 5 mg via INTRAVENOUS
  Filled 2021-03-20: qty 5

## 2021-03-20 MED ORDER — ICOSAPENT ETHYL 1 G PO CAPS
2.0000 g | ORAL_CAPSULE | Freq: Two times a day (BID) | ORAL | Status: DC
  Filled 2021-03-20 (×2): qty 2

## 2021-03-20 MED ORDER — INSULIN GLARGINE 100 UNIT/ML ~~LOC~~ SOLN
80.0000 [IU] | Freq: Every day | SUBCUTANEOUS | Status: DC
  Administered 2021-03-21 – 2021-03-24 (×4): 80 [IU] via SUBCUTANEOUS
  Filled 2021-03-20 (×4): qty 0.8

## 2021-03-20 MED ORDER — POTASSIUM CHLORIDE CRYS ER 20 MEQ PO TBCR
40.0000 meq | EXTENDED_RELEASE_TABLET | Freq: Once | ORAL | Status: AC
  Administered 2021-03-20: 40 meq via ORAL
  Filled 2021-03-20: qty 2

## 2021-03-20 MED ORDER — DIVALPROEX SODIUM 125 MG PO CSDR
125.0000 mg | DELAYED_RELEASE_CAPSULE | Freq: Three times a day (TID) | ORAL | Status: DC
  Administered 2021-03-20 – 2021-03-24 (×12): 125 mg via ORAL
  Filled 2021-03-20 (×13): qty 1

## 2021-03-20 MED ORDER — LOSARTAN POTASSIUM 50 MG PO TABS
100.0000 mg | ORAL_TABLET | Freq: Every day | ORAL | Status: DC
  Administered 2021-03-21 – 2021-03-22 (×2): 100 mg via ORAL
  Filled 2021-03-20 (×3): qty 2

## 2021-03-20 MED ORDER — THIAMINE HCL 100 MG PO TABS
500.0000 mg | ORAL_TABLET | Freq: Every day | ORAL | Status: DC
  Administered 2021-03-21 – 2021-03-24 (×4): 500 mg via ORAL
  Filled 2021-03-20 (×4): qty 5

## 2021-03-20 MED ORDER — LORAZEPAM 1 MG PO TABS
1.0000 mg | ORAL_TABLET | ORAL | Status: AC | PRN
  Administered 2021-03-20 – 2021-03-21 (×2): 1 mg via ORAL
  Administered 2021-03-21: 2 mg via ORAL
  Administered 2021-03-21: 3 mg via ORAL
  Administered 2021-03-23: 1 mg via ORAL
  Filled 2021-03-20: qty 2
  Filled 2021-03-20: qty 1
  Filled 2021-03-20: qty 3
  Filled 2021-03-20 (×2): qty 1

## 2021-03-20 MED ORDER — ALLOPURINOL 300 MG PO TABS
300.0000 mg | ORAL_TABLET | Freq: Every day | ORAL | Status: DC
  Administered 2021-03-21 – 2021-03-24 (×4): 300 mg via ORAL
  Filled 2021-03-20 (×4): qty 1

## 2021-03-20 MED ORDER — PREDNISONE 50 MG PO TABS
60.0000 mg | ORAL_TABLET | Freq: Every day | ORAL | Status: DC
  Administered 2021-03-21 – 2021-03-24 (×4): 60 mg via ORAL
  Filled 2021-03-20 (×4): qty 1

## 2021-03-20 MED ORDER — MAGNESIUM OXIDE -MG SUPPLEMENT 400 (240 MG) MG PO TABS
400.0000 mg | ORAL_TABLET | Freq: Every day | ORAL | Status: DC
  Administered 2021-03-20 – 2021-03-24 (×5): 400 mg via ORAL
  Filled 2021-03-20 (×5): qty 1

## 2021-03-20 MED ORDER — LINAGLIPTIN 5 MG PO TABS
5.0000 mg | ORAL_TABLET | Freq: Every day | ORAL | Status: DC
  Administered 2021-03-21 – 2021-03-24 (×4): 5 mg via ORAL
  Filled 2021-03-20 (×5): qty 1

## 2021-03-20 MED ORDER — DILTIAZEM LOAD VIA INFUSION
10.0000 mg | Freq: Once | INTRAVENOUS | Status: AC
  Administered 2021-03-20: 10 mg via INTRAVENOUS
  Filled 2021-03-20: qty 10

## 2021-03-20 MED ORDER — POTASSIUM CHLORIDE CRYS ER 10 MEQ PO TBCR
10.0000 meq | EXTENDED_RELEASE_TABLET | Freq: Every day | ORAL | Status: DC
  Administered 2021-03-21: 10 meq via ORAL
  Filled 2021-03-20 (×2): qty 1

## 2021-03-20 MED ORDER — DILTIAZEM HCL ER COATED BEADS 360 MG PO CP24: 360.0000 mg | ORAL_CAPSULE | Freq: Every day | ORAL | 0 refills | Status: DC

## 2021-03-20 MED ORDER — ADULT MULTIVITAMIN W/MINERALS CH
1.0000 | ORAL_TABLET | Freq: Every day | ORAL | Status: DC
  Administered 2021-03-20 – 2021-03-24 (×5): 1 via ORAL
  Filled 2021-03-20 (×5): qty 1

## 2021-03-20 MED ORDER — FOLIC ACID 1 MG PO TABS
1.0000 mg | ORAL_TABLET | Freq: Every day | ORAL | Status: DC
  Administered 2021-03-20 – 2021-03-24 (×5): 1 mg via ORAL
  Filled 2021-03-20 (×5): qty 1

## 2021-03-20 MED ORDER — THIAMINE HCL 100 MG PO TABS
100.0000 mg | ORAL_TABLET | Freq: Every day | ORAL | Status: DC
  Filled 2021-03-20: qty 1

## 2021-03-20 MED ORDER — UMECLIDINIUM BROMIDE 62.5 MCG/INH IN AEPB
1.0000 | INHALATION_SPRAY | Freq: Every day | RESPIRATORY_TRACT | Status: DC
  Administered 2021-03-21 – 2021-03-24 (×4): 1 via RESPIRATORY_TRACT
  Filled 2021-03-20: qty 7

## 2021-03-20 MED ORDER — METFORMIN HCL ER 500 MG PO TB24
500.0000 mg | ORAL_TABLET | Freq: Two times a day (BID) | ORAL | Status: DC
  Administered 2021-03-20 – 2021-03-21 (×2): 500 mg via ORAL
  Filled 2021-03-20 (×2): qty 1

## 2021-03-20 MED ORDER — VALACYCLOVIR HCL 500 MG PO TABS
1000.0000 mg | ORAL_TABLET | Freq: Three times a day (TID) | ORAL | Status: DC
  Administered 2021-03-20 – 2021-03-24 (×12): 1000 mg via ORAL
  Filled 2021-03-20 (×14): qty 2

## 2021-03-20 MED ORDER — ACETAMINOPHEN 325 MG PO TABS: 650.0000 mg | ORAL_TABLET | ORAL | Status: DC | PRN

## 2021-03-20 MED ORDER — FLUTICASONE-UMECLIDIN-VILANT 100-62.5-25 MCG/INH IN AEPB: 1.0000 | INHALATION_SPRAY | Freq: Every day | RESPIRATORY_TRACT | Status: DC

## 2021-03-20 MED ORDER — ACETAMINOPHEN 325 MG PO TABS: 325.0000 mg | ORAL_TABLET | Freq: Three times a day (TID) | ORAL | Status: DC | PRN

## 2021-03-20 MED ORDER — METOPROLOL TARTRATE 25 MG PO TABS: 50.0000 mg | ORAL_TABLET | Freq: Two times a day (BID) | ORAL | Status: DC

## 2021-03-20 MED ORDER — SODIUM CHLORIDE 0.9 % IV BOLUS
500.0000 mL | Freq: Once | INTRAVENOUS | Status: AC
  Administered 2021-03-20: 500 mL via INTRAVENOUS

## 2021-03-20 MED ORDER — APIXABAN 5 MG PO TABS
5.0000 mg | ORAL_TABLET | Freq: Two times a day (BID) | ORAL | Status: DC
  Administered 2021-03-20 – 2021-03-24 (×8): 5 mg via ORAL
  Filled 2021-03-20 (×8): qty 1

## 2021-03-20 MED ORDER — MONTELUKAST SODIUM 10 MG PO TABS
10.0000 mg | ORAL_TABLET | Freq: Every day | ORAL | Status: DC
  Administered 2021-03-20 – 2021-03-23 (×4): 10 mg via ORAL
  Filled 2021-03-20 (×5): qty 1

## 2021-03-20 MED ORDER — METOPROLOL TARTRATE 50 MG PO TABS
50.0000 mg | ORAL_TABLET | Freq: Three times a day (TID) | ORAL | Status: DC
  Administered 2021-03-20 – 2021-03-24 (×12): 50 mg via ORAL
  Filled 2021-03-20 (×2): qty 2
  Filled 2021-03-20: qty 1
  Filled 2021-03-20 (×3): qty 2
  Filled 2021-03-20: qty 1
  Filled 2021-03-20: qty 2
  Filled 2021-03-20: qty 1
  Filled 2021-03-20: qty 2
  Filled 2021-03-20: qty 1
  Filled 2021-03-20: qty 2

## 2021-03-20 MED ORDER — B COMPLEX-C-FOLIC ACID PO TABS: ORAL_TABLET | Freq: Every day | ORAL | Status: DC

## 2021-03-20 MED ORDER — LORAZEPAM 2 MG/ML IJ SOLN
1.0000 mg | INTRAMUSCULAR | Status: AC | PRN
Start: 2021-03-20 — End: 2021-03-23
  Administered 2021-03-21 (×2): 4 mg via INTRAVENOUS
  Filled 2021-03-20 (×2): qty 2
  Filled 2021-03-20: qty 1

## 2021-03-20 MED ORDER — DULOXETINE HCL 60 MG PO CPEP
60.0000 mg | ORAL_CAPSULE | Freq: Every day | ORAL | Status: DC
  Administered 2021-03-21 – 2021-03-24 (×4): 60 mg via ORAL
  Filled 2021-03-20 (×6): qty 1

## 2021-03-20 MED ORDER — GABAPENTIN 300 MG PO CAPS
600.0000 mg | ORAL_CAPSULE | Freq: Every day | ORAL | Status: DC
  Administered 2021-03-20 – 2021-03-23 (×4): 600 mg via ORAL
  Filled 2021-03-20 (×4): qty 2

## 2021-03-20 MED ORDER — THIAMINE HCL 100 MG/ML IJ SOLN: 100.0000 mg | Freq: Every day | INTRAMUSCULAR | Status: DC

## 2021-03-20 MED ORDER — INSULIN ASPART 100 UNIT/ML IJ SOLN
0.0000 [IU] | Freq: Three times a day (TID) | INTRAMUSCULAR | Status: DC
  Administered 2021-03-21: 2 [IU] via SUBCUTANEOUS
  Administered 2021-03-21 – 2021-03-22 (×3): 3 [IU] via SUBCUTANEOUS
  Administered 2021-03-23: 2 [IU] via SUBCUTANEOUS
  Administered 2021-03-23: 3 [IU] via SUBCUTANEOUS
  Administered 2021-03-23 – 2021-03-24 (×2): 2 [IU] via SUBCUTANEOUS
  Administered 2021-03-24: 3 [IU] via SUBCUTANEOUS

## 2021-03-20 MED ORDER — DOFETILIDE 125 MCG PO CAPS: 125.0000 ug | ORAL_CAPSULE | Freq: Two times a day (BID) | ORAL | Status: DC

## 2021-03-20 MED ORDER — HYDRALAZINE HCL 10 MG PO TABS: 10.0000 mg | ORAL_TABLET | Freq: Four times a day (QID) | ORAL | Status: DC | PRN

## 2021-03-20 MED ORDER — ONDANSETRON HCL 4 MG/2ML IJ SOLN: 4.0000 mg | Freq: Four times a day (QID) | INTRAMUSCULAR | Status: DC | PRN

## 2021-03-20 NOTE — ED Notes (Signed)
Called to give report. RN to assign pt to a nurse.

## 2021-03-20 NOTE — ED Provider Notes (Signed)
[facility] EMERGENCY DEPARTMENT Provider Note   CSN: [id number] Arrival date & time: 03/20/21  1243     History Chief Complaint  Patient presents with   Tachycardia    Adam [name] is a 70 y.o. male.  Patient is a 70 year old male with a history of atrial fibrillation on Xarelto, coronary artery disease, COPD reported dementia and alcohol abuse.  He presents with tachycardia.  He says he has been feel like his heart has been racing for the last few days but cannot tell me exactly how long.  He feels more short of breath than normal.  He denies any chest pain.  No leg swelling.  No fevers.  No cough or cold symptoms.  He says he has been taking his medicines.  Initially he says he has not missed any medications.  However then he said that he has been out of his Cardizem for the last 3 days.  Reportedly per his wife, they got a notification that there is some insurance problem and it was not able to be filled.  He does say that he is taking his Xarelto.  He states that he last took it this morning.  He does state he continues to drink alcohol.  He says he drank a couple glasses of wine this morning.      Past Medical History:  Diagnosis Date   Allergy    Anxiety    history of PTSD following CABG   Ascending aortic aneurysm ([place]) 01/31/2018   43 x 42 mm, pt unaware   Asthma    Cardiomegaly 10/17/2017   Colitis- colonoscopy 2014 08-09-15   COPD (chronic obstructive pulmonary disease) ([facility])    Coronary artery disease    x 6   Depression    Diabetes mellitus without complication ([place])    Family history of polyps in the colon    Finger dislocation    Left pinkie   GERD (gastroesophageal reflux disease)    Gout    H/O atrial fibrillation without current medication    following CABG with no documented episodes since then.   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    Patient Active Problem List   Diagnosis Date Noted   A-fib ([place]) 03/20/2021   Hypertension 06/05/2020   Protein-calorie malnutrition, severe 03/01/2020   Severe malnutrition ([place]) 02/28/2020   Pressure injury of skin 02/21/2020   Rapid atrial fibrillation ([place]) 02/20/2020   Atrial fibrillation with RVR ([facility]) 02/08/2020   High anion gap metabolic acidosis 99/83/3825   Secondary hypercoagulable state ([place]) 11/02/2019   Alcohol withdrawal ([place]) 10/24/2019   Paroxysmal atrial fibrillation ([place]) 10/24/2019   Rectal bleeding 10/24/2019   Degeneration of lumbar intervertebral disc 09/27/2019   Thoracic aortic aneurysm ([place]) 02/08/2019   Hemoptysis 10/06/2018   S/P right THA, 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   S/P hip replacement 09/12/2018   Low back pain 05/19/2018   Sleep apnea 01/09/2018   Persistent atrial fibrillation 12/12/2017   Allergic rhinitis 08/22/2015   Family history of colon cancer in mother deceased age 74 09-Aug-2015   Type 2 diabetes mellitus, controlled ([place]) 07/17/2013   COPD (chronic obstructive pulmonary disease) ([place]) 07/05/2013  CAD (coronary artery disease) 06/26/2013   Hypertension associated with diabetes ([place]) 06/26/2013   Hyperlipidemia associated with type 2 diabetes mellitus ([place]) 06/26/2013   GERD (gastroesophageal reflux disease) 06/26/2013   History of atrial fibrillation without current medication 06/26/2013   Gout 06/26/2013   Osteoarthritis 06/26/2013   Asthma, mild persistent 06/26/2013   Alcohol abuse 06/26/2013   PTSD (post-traumatic stress disorder) 06/26/2013   HLA B27 (HLA B27 positive) 06/26/2013   Obesity (BMI 30-39.9) 06/26/2013   Benign essential hypertension 06/26/2013    Past Surgical History:  Procedure Laterality Date   ANKLE FRACTURE SURGERY Right [place] N/A 03/31/2018   Procedure: ATRIAL FIBRILLATION ABLATION;  Surgeon: [name], MD;  Location: [facility];  Service: Cardiovascular;  Laterality: N/A;   CARDIAC ELECTROPHYSIOLOGY MAPPING AND ABLATION     COLONOSCOPY W/ BIOPSIES  2017   x7   CORONARY ANGIOPLASTY WITH STENT PLACEMENT     CORONARY ARTERY BYPASS GRAFT     FINGER SURGERY  04/2018   Small finger left hand   implantable loop recorder placement  07/02/2019   Medtronic Reveal [place] model LNQ11 (SN [id number]) implanted in office by Dr Rayann Heman   TEE WITHOUT CARDIOVERSION N/A 03/30/2018   Procedure: TRANSESOPHAGEAL ECHOCARDIOGRAM (TEE);  Surgeon: [name], MD;  Location: [facility];  Service: Cardiovascular;  Laterality: N/A;   TOTAL HIP ARTHROPLASTY Left    TOTAL HIP ARTHROPLASTY Right 09/12/2018   Procedure: TOTAL HIP ARTHROPLASTY ANTERIOR APPROACH;  Surgeon: [name], MD;  Location: [facility];  Service: Orthopedics;  Laterality: Right;  70 mins       Family History  Problem Relation Age of Onset   Colon cancer Mother    Cancer Father        UNKNOWN TYPE   Heart disease Paternal Grandmother    Heart disease Paternal Grandfather     Social History   Tobacco Use   Smoking status: Former    Packs/day: 1.00    Years: 7.00    Pack years: 7.00    Types: Cigarettes    Quit date: 10/04/1977    Years since quitting: 43.4   Smokeless tobacco: Never   Tobacco comments:    never smoked over 1 pack   Vaping Use   Vaping Use: Never used  Substance Use Topics   Alcohol use: Yes    Alcohol/week: 3.0 - 4.0 standard drinks    Types: 1 Glasses of wine, 2 - 3 Shots of liquor per week    Comment: DAILY   Drug use: No    Comment: Smoked Marijuana back in 1970s    Home Medications Prior to Admission medications   Medication Sig Start Date End Date Taking? Authorizing Provider  acetaminophen (TYLENOL) 325 MG tablet Take 325 mg by mouth every 8 (eight) hours as needed.    [provider]  allopurinol (ZYLOPRIM) 300 MG tablet TAKE 1 TABLET(300 MG) BY MOUTH DAILY Patient taking differently: Take 300 mg by mouth daily. 12/05/20   [name], Alinda Sierras, MD  B COMPLEX-C-FOLIC ACID PO Take by mouth daily.    [provider]  Blood Glucose Monitoring Suppl (ONETOUCH VERIO) w/Device KIT Use 1-4 times daily as needed/directed DX E11.9 09/29/20   [name], Alinda Sierras, MD  diltiazem (CARDIZEM CD) 360 MG 24 hr capsule Take 1 capsule (360 mg total) by mouth daily. 03/20/21   [name], Alinda Sierras, MD  divalproex (DEPAKOTE SPRINKLE) 125 MG capsule Take 1  capsule (125 mg total) by mouth 3 (three) times daily. 09/15/20   [name], Alinda Sierras, MD  dofetilide (TIKOSYN) 125 MCG capsule Take 1 capsule (125 mcg total) by mouth 2 (two) times daily. 11/17/20   Allred, Jeneen Rinks, MD  DULoxetine (CYMBALTA) 60 MG capsule TAKE 1 CAPSULE(60 MG) BY MOUTH DAILY 03/19/21   [name], Alinda Sierras, MD  ELIQUIS 5 MG TABS tablet TAKE 1 TABLET(5 MG) BY MOUTH TWICE DAILY 03/04/21   [name], Alinda Sierras, MD  Fluticasone-Umeclidin-Vilant (TRELEGY ELLIPTA) 100-62.5-25 MCG/INH AEPB Inhale 1 puff into the lungs daily. 11/11/20   [name], MD  gabapentin (NEURONTIN) 300 MG capsule Take 2 capsules at night as needed for restless leg symptoms 09/23/20   [name], MD  icosapent Ethyl (VASCEPA) 1 g capsule TAKE 2 CAPSULES(2 GRAMS) BY MOUTH TWICE DAILY Patient taking differently: TAKE 2 CAPSULES(2 GRAMS) BY MOUTH DAILY 12/30/20   [place], [name], MD  insulin glargine, 2 Unit Dial, (TOUJEO MAX SOLOSTAR) 300 UNIT/ML Solostar Pen Inject 130 Units into the skin daily. 03/05/21   [name], Alinda Sierras, MD  Insulin Pen Needle (NOVOFINE PEN NEEDLE) 32G X 6 MM MISC Use 1-4 times daily as needed for insulin 09/15/20   [name], Alinda Sierras, MD  JANUVIA 100 MG tablet TAKE 1 TABLET(100 MG) BY MOUTH DAILY 12/05/20   [name], Alinda Sierras, MD  JARDIANCE 25 MG TABS tablet TAKE 1 TABLET(25 MG) BY MOUTH DAILY BEFORE AND BREAKFAST Patient taking  differently: Take 25 mg by mouth daily before breakfast. 03/04/21   [name], Alinda Sierras, MD  Lancets [facility] ULTRASOFT) lancets Use 1-4 times daily as needed/directed  DX E11.9 09/29/20   [name], Alinda Sierras, MD  Lidocaine HCl 4 % CREA Apply topically as needed.    [provider]  losartan (COZAAR) 100 MG tablet TAKE 1 TABLET(100 MG) BY MOUTH DAILY 03/04/21   [name], Alinda Sierras, MD  Magnesium Oxide 400 MG CAPS Take 1 capsule (400 mg total) by mouth daily. 09/15/20   [name], Alinda Sierras, MD  metFORMIN (GLUCOPHAGE-XR) 500 MG 24 hr tablet Take 1 tablet (500 mg total) by mouth in the morning and at bedtime. 09/23/20   [name], Alinda Sierras, MD  metoprolol tartrate (LOPRESSOR) 50 MG tablet Take 1 tablet (50 mg total) by mouth 2 (two) times daily. 03/17/21 03/17/22  [name], NP  montelukast (SINGULAIR) 10 MG tablet TAKE 1 TABLET BY MOUTH AT BEDTIME 12/05/20   [name], Alinda Sierras, MD  Multiple Vitamin (MULTIVITAMIN WITH MINERALS) TABS tablet Take 1 tablet by mouth daily. 03/04/20   Mercy Riding, MD  [facility] VERIO test strip Use 1-4 times daily as directed/needed   DX E11.9 09/29/20   [name], Alinda Sierras, MD  potassium chloride SA (KLOR-CON) 20 MEQ tablet Take 2 tablets once daily 09/23/20   [name], Alinda Sierras, MD  PSYLLIUM HUSK PO Take by mouth. 2 cap twice daily    [provider]  rosuvastatin (CRESTOR) 40 MG tablet Take 1 tablet (40 mg total) by mouth daily. 09/09/20 12/08/20  [name], Alinda Sierras, MD  thiamine (VITAMIN B-1) 100 MG tablet Take 500 mg by mouth daily.    [provider]    Allergies    Xarelto [rivaroxaban], Amoxicillin, Augmentin [amoxicillin-pot clavulanate], Azithromycin, Clindamycin/lincomycin, and Keflex [cephalexin]  Review of Systems   Review of Systems  Constitutional:  Positive for fatigue. Negative for chills, diaphoresis and fever.  HENT:  Negative for congestion, rhinorrhea and sneezing.   Eyes: Negative.   Respiratory:  Positive for  shortness of  breath. Negative for cough and chest tightness.   Cardiovascular:  Positive for palpitations. Negative for chest pain and leg swelling.  Gastrointestinal:  Negative for abdominal pain, blood in stool, diarrhea, nausea and vomiting.  Genitourinary:  Negative for difficulty urinating, flank pain, frequency and hematuria.  Musculoskeletal:  Negative for arthralgias and back pain.  Skin:  Negative for rash.  Neurological:  Negative for dizziness, speech difficulty, weakness, numbness and headaches.   Physical Exam Updated Vital Signs BP (!) 146/113   Pulse (!) 141   Temp 98.7 F (37.1 C)   Resp 20   SpO2 96%   Physical Exam Constitutional:      Appearance: He is well-developed.  HENT:     Head: Normocephalic and atraumatic.  Eyes:     Pupils: Pupils are equal, round, and reactive to light.  Cardiovascular:     Rate and Rhythm: Regular rhythm. Tachycardia present.     Heart sounds: Normal heart sounds.  Pulmonary:     Effort: Pulmonary effort is normal. No respiratory distress.     Breath sounds: Normal breath sounds. No wheezing or rales.  Chest:     Chest wall: No tenderness.  Abdominal:     General: Bowel sounds are normal.     Palpations: Abdomen is soft.     Tenderness: There is no abdominal tenderness. There is no guarding or rebound.  Musculoskeletal:        General: No swelling. Normal range of motion.     Cervical back: Normal range of motion and neck supple.  Lymphadenopathy:     Cervical: No cervical adenopathy.  Skin:    General: Skin is warm and dry.     Findings: No rash.  Neurological:     Mental Status: He is alert and oriented to person, place, and time.    ED Results / Procedures / Treatments   Labs (all labs ordered are listed, but only abnormal results are displayed) Labs Reviewed  BASIC METABOLIC PANEL - Abnormal; Notable for the following components:      Result Value   Glucose, Bld 119 (*)    All other components within normal limits  CBC -  Abnormal; Notable for the following components:   RBC 4.11 (*)    All other components within normal limits  BRAIN NATRIURETIC PEPTIDE - Abnormal; Notable for the following components:   B Natriuretic Peptide 286.3 (*)    All other components within normal limits  SARS CORONAVIRUS 2 (TAT 6-24 HRS)  ETHANOL  MAGNESIUM  PHOSPHORUS  HIV ANTIBODY (ROUTINE TESTING W REFLEX)  TSH  TROPONIN I (HIGH SENSITIVITY)  TROPONIN I (HIGH SENSITIVITY)    EKG EKG Interpretation  Date/Time:  Friday March 20 2021 13:11:34 EDT Ventricular Rate:  142 PR Interval:  77 QRS Duration: 103 QT Interval:  332 QTC Calculation: 511 R Axis:   44 Text Interpretation: Sinus tachycardia Low voltage, precordial leads ST depression, probably rate related Prolonged QT interval Confirmed by Malvin Johns [phone]) on 03/20/2021 1:21:59 PM  Radiology DG Chest Port 1 View  Result Date: 03/20/2021 CLINICAL DATA:  Shortness of breath EXAM: PORTABLE CHEST 1 VIEW COMPARISON:  03/16/2021 FINDINGS: Low lung volumes. Improved aeration at lung bases. No pleural effusion. No pneumothorax. Stable cardiomediastinal contours. IMPRESSION: Improved aeration at the lung bases. Electronically Signed   By: Macy Mis M.D.   On: 03/20/2021 14:24    Procedures Procedures   Medications Ordered in ED Medications  diltiazem (CARDIZEM) 1 mg/mL load  via infusion 10 mg (10 mg Intravenous Bolus from Bag 03/20/21 1504)    And  diltiazem (CARDIZEM) 125 mg in dextrose 5% 125 mL (1 mg/mL) infusion (15 mg/hr Intravenous Infusion Verify 03/20/21 1603)  LORazepam (ATIVAN) tablet 1-4 mg (has no administration in time range)    Or  LORazepam (ATIVAN) injection 1-4 mg (has no administration in time range)  thiamine tablet 100 mg (has no administration in time range)    Or  thiamine (B-1) injection 100 mg (has no administration in time range)  folic acid (FOLVITE) tablet 1 mg (has no administration in time range)  multivitamin with minerals  tablet 1 tablet (has no administration in time range)  acetaminophen (TYLENOL) tablet 325 mg (has no administration in time range)  allopurinol (ZYLOPRIM) tablet 300 mg (has no administration in time range)  icosapent Ethyl (VASCEPA) 1 g capsule 2 g (has no administration in time range)  losartan (COZAAR) tablet 100 mg (has no administration in time range)  metoprolol tartrate (LOPRESSOR) tablet 50 mg (has no administration in time range)  DULoxetine (CYMBALTA) DR capsule 60 mg (has no administration in time range)  insulin glargine (2 Unit Dial) (TOUJEO MAX) Solostar Pen SOPN 130 Units (has no administration in time range)  linagliptin (TRADJENTA) tablet 5 mg (has no administration in time range)  empagliflozin (JARDIANCE) tablet 25 mg (has no administration in time range)  metFORMIN (GLUCOPHAGE-XR) 24 hr tablet 500 mg (has no administration in time range)  Magnesium Oxide CAPS 400 mg (has no administration in time range)  apixaban (ELIQUIS) tablet 5 mg (has no administration in time range)  divalproex (DEPAKOTE SPRINKLE) capsule 125 mg (has no administration in time range)  gabapentin (NEURONTIN) capsule 600 mg (has no administration in time range)  B Complex-C-Folic Acid TABS (has no administration in time range)  potassium chloride (KLOR-CON) CR tablet 10 mEq (has no administration in time range)  thiamine (Vitamin B-1) tablet 500 mg (has no administration in time range)  Fluticasone-Umeclidin-Vilant 100-62.5-25 MCG/INH AEPB 1 puff (has no administration in time range)  montelukast (SINGULAIR) tablet 10 mg (has no administration in time range)  acetaminophen (TYLENOL) tablet 650 mg (has no administration in time range)  ondansetron (ZOFRAN) injection 4 mg (has no administration in time range)  predniSONE (DELTASONE) tablet 60 mg (has no administration in time range)  valACYclovir (VALTREX) tablet 1,000 mg (has no administration in time range)  sodium chloride 0.9 % bolus 500 mL (0 mLs  Intravenous Stopped 03/20/21 1500)    ED Course  I have reviewed the triage vital signs and the nursing notes.  Pertinent labs & imaging results that were available during my care of the patient were reviewed by me and considered in my medical decision making (see chart for details).    MDM Rules/Calculators/A&P                          Patient is a 70 year old male who presents with A. fib with RVR.  He is complicated by noncompliance with medication and alcohol abuse.  His labs are nonconcerning.  His chest x-ray is clear without evidence of edema or pneumonia.  I spoke with Dr. Joylene Grapes with cardiology who recommends Cardizem for rate control.  I do not feel comfortable with cardioversion given his history of noncompliance and alcohol abuse.  He was started on Cardizem with a bolus and a drip.  I spoke with Dr. Roosevelt Locks with the hospitalist service he will admit the  patient for further treatment.  CRITICAL CARE Performed by: Malvin Johns Total critical care time: 60 minutes Critical care time was exclusive of separately billable procedures and treating other patients. Critical care was necessary to treat or prevent imminent or life-threatening deterioration. Critical care was time spent personally by me on the following activities: development of treatment plan with patient and/or surrogate as well as nursing, discussions with consultants, evaluation of patient's response to treatment, examination of patient, obtaining history from patient or surrogate, ordering and performing treatments and interventions, ordering and review of laboratory studies, ordering and review of radiographic studies, pulse oximetry and re-evaluation of patient's condition.  Final Clinical Impression(s) / ED Diagnoses Final diagnoses:  Atrial fibrillation with rapid ventricular response [facility])    Rx / DC Orders ED Discharge Orders          Ordered    Amb referral to [facility] Clinic        03/20/21 1545              Malvin Johns, MD 03/20/21 1605

## 2021-03-20 NOTE — Patient Instructions (Signed)
Go straight to ER at [facility]

## 2021-03-20 NOTE — H&P (Signed)
History and Physical    Adam [name] FAO:[id number] DOB: 1951/04/14 DOA: 03/20/2021  PCP: [name], MD (Confirm with patient/family/NH records and if not entered, this has to be entered at [facility] point of entry) Patient coming from: Home  I have personally briefly reviewed patient's old medical records in [place]  Chief Complaint: Palpitations  HPI: Adam [name] is a 70 y.o. male with medical history significant of chronic A. fib on Tikosyn and Eliquis, CAD status post stenting, IDDM, HTN, HLD, came with palpitations.  Patient has not been able to take Cardizem for last 3 days due to insurance issue.  And this morning he started to have palpitations and was found heart rate in the 150s.  Denies any chest pain, no shortness of breath.  He is a chronic drinker, last drink was this morning with a half a glass of wine.  3 days ago, patient developed acute onset of left-sided facial droop, and drooling and slurred speech, went to see PCP who ordered a brain MRI which showed negative for acute stroke.  Denies any weakness or numbness of any of the limbs, no hearing changes no blurry vision.  ED Course: Heart rate in the 140s, EKG showed uncontrolled A. fib, blood pressure stable, no hypoxia chest x-ray clean.  Cardiology consulted, recommend Cardizem drip and admit.  Potassium 3.8.  Review of Systems: As per HPI otherwise 14 point review of systems negative.    Past Medical History:  Diagnosis Date   Allergy    Anxiety    history of PTSD following CABG   Ascending aortic aneurysm ([place]) 01/31/2018   43 x 42 mm, pt unaware   Asthma    Cardiomegaly 10/17/2017   Colitis- colonoscopy 2014 07/13/2015   COPD (chronic obstructive pulmonary disease) ([place])    Coronary artery disease    x 6   Depression    Diabetes mellitus without complication ([place])    Family history of polyps in the colon    Finger dislocation    Left pinkie   GERD (gastroesophageal reflux disease)     Gout    H/O atrial fibrillation without current medication    following CABG with no documented episodes since then.   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    Past Surgical History:  Procedure Laterality Date   ANKLE FRACTURE SURGERY Right 1991   APPENDECTOMY     ATRIAL FIBRILLATION ABLATION N/A 03/31/2018   Procedure: ATRIAL FIBRILLATION ABLATION;  Surgeon: [name], MD;  Location: [facility];  Service: Cardiovascular;  Laterality: N/A;   CARDIAC ELECTROPHYSIOLOGY MAPPING AND ABLATION     COLONOSCOPY W/ BIOPSIES  2017   x7   CORONARY ANGIOPLASTY WITH STENT PLACEMENT     CORONARY ARTERY BYPASS GRAFT     FINGER SURGERY  04/2018   Small finger left hand   implantable loop recorder placement  07/02/2019   Medtronic Reveal [place] model LNQ11 (SN [id number]) implanted in office by Dr Rayann Heman   TEE WITHOUT CARDIOVERSION N/A 03/30/2018   Procedure: TRANSESOPHAGEAL ECHOCARDIOGRAM (TEE);  Surgeon: [name], MD;  Location: [facility];  Service: Cardiovascular;  Laterality: N/A;   TOTAL HIP  ARTHROPLASTY Left    TOTAL HIP ARTHROPLASTY Right 09/12/2018   Procedure: TOTAL HIP ARTHROPLASTY ANTERIOR APPROACH;  Surgeon: [name], MD;  Location: [facility];  Service: Orthopedics;  Laterality: Right;  70 mins     reports that he quit smoking about 43 years ago. His smoking use included cigarettes. He has a 7.00 pack-year smoking history. He has never used smokeless tobacco. He reports current alcohol use of about 3.0 - 4.0 standard drinks of alcohol per week. He reports that he does not use drugs.  Allergies  Allergen Reactions   Xarelto [Rivaroxaban] Other (See Comments)    Patient stated he "ended up in the hospital with a rectal  bleed"   Amoxicillin Rash and Other (See Comments)    * SEVERE RASH IN GROIN AREA Has patient had a PCN reaction causing immediate rash, facial/tongue/throat swelling, SOB or lightheadedness with hypotension: no Has patient had a PCN reaction causing severe rash involving mucus membranes or skin necrosis: no Has patient had a PCN reaction that required hospitalization: no Has patient had a PCN reaction occurring within the last 10 years: yes If all of the above answers are "NO", then may proceed with Cephalosporin use.    Augmentin [Amoxicillin-Pot Clavulanate] Rash and Other (See Comments)    * SEVERE RASH IN GROIN AREA   Azithromycin Rash and Other (See Comments)    * SEVERE RASH IN GROIN AREA   Clindamycin/Lincomycin Rash   Keflex [Cephalexin] Rash    Family History  Problem Relation Age of Onset   Colon cancer Mother    Cancer Father        UNKNOWN TYPE   Heart disease Paternal Grandmother    Heart disease Paternal Grandfather      Prior to Admission medications   Medication Sig Start Date End Date Taking? Authorizing Provider  acetaminophen (TYLENOL) 325 MG tablet Take 325 mg by mouth every 8 (eight) hours as needed.    [provider]  allopurinol (ZYLOPRIM) 300 MG tablet TAKE 1 TABLET(300 MG) BY MOUTH DAILY Patient taking differently: Take 300 mg by mouth daily. 12/05/20   [name], Alinda Sierras, MD  B COMPLEX-C-FOLIC ACID PO Take by mouth daily.    [provider]  Blood Glucose Monitoring Suppl (ONETOUCH VERIO) w/Device KIT Use 1-4 times daily as needed/directed DX E11.9 09/29/20   [name], Alinda Sierras, MD  diltiazem (CARDIZEM CD) 360 MG 24 hr capsule Take 1 capsule (360 mg total) by mouth daily. 03/20/21   [name], Alinda Sierras, MD  divalproex (DEPAKOTE SPRINKLE) 125 MG capsule Take 1 capsule (125 mg total) by mouth 3 (three) times daily. 09/15/20   [name], Alinda Sierras, MD  dofetilide (TIKOSYN) 125 MCG capsule Take 1 capsule (125 mcg total) by mouth 2 (two) times  daily. 11/17/20   Allred, Jeneen Rinks, MD  DULoxetine (CYMBALTA) 60 MG capsule TAKE 1 CAPSULE(60 MG) BY MOUTH DAILY 03/19/21   [name], Alinda Sierras, MD  ELIQUIS 5 MG TABS tablet TAKE 1 TABLET(5 MG) BY MOUTH TWICE DAILY 03/04/21   [name], Alinda Sierras, MD  Fluticasone-Umeclidin-Vilant (TRELEGY ELLIPTA) 100-62.5-25 MCG/INH AEPB Inhale 1 puff into the lungs daily. 11/11/20   [name], MD  gabapentin (NEURONTIN) 300 MG capsule Take 2 capsules at night as needed for restless leg symptoms 09/23/20   [name], MD  icosapent Ethyl (VASCEPA) 1 g capsule TAKE 2 CAPSULES(2 GRAMS) BY MOUTH TWICE DAILY Patient taking differently: TAKE 2 CAPSULES(2 GRAMS) BY MOUTH DAILY 12/30/20   Martinique, [name], MD  insulin glargine, 2 Unit Dial, (TOUJEO MAX SOLOSTAR) 300 UNIT/ML Solostar Pen Inject 130 Units into the skin daily. 03/05/21   [name], Alinda Sierras, MD  Insulin Pen Needle (NOVOFINE PEN NEEDLE) 32G X 6 MM MISC Use 1-4 times daily as needed for insulin 09/15/20   [name], Alinda Sierras, MD  JANUVIA 100 MG tablet TAKE 1 TABLET(100 MG) BY MOUTH DAILY 12/05/20   [name], Alinda Sierras, MD  JARDIANCE 25 MG TABS tablet TAKE 1 TABLET(25 MG) BY MOUTH DAILY BEFORE AND BREAKFAST Patient taking differently: Take 25 mg by mouth daily before breakfast. 03/04/21   [name], Alinda Sierras, MD  Lancets [facility] ULTRASOFT) lancets Use 1-4 times daily as needed/directed  DX E11.9 09/29/20   [name], Alinda Sierras, MD  Lidocaine HCl 4 % CREA Apply topically as needed.    [provider]  losartan (COZAAR) 100 MG tablet TAKE 1 TABLET(100 MG) BY MOUTH DAILY 03/04/21   [name], Alinda Sierras, MD  Magnesium Oxide 400 MG CAPS Take 1 capsule (400 mg total) by mouth daily. 09/15/20   [name], Alinda Sierras, MD  metFORMIN (GLUCOPHAGE-XR) 500 MG 24 hr tablet Take 1 tablet (500 mg total) by mouth in the morning and at bedtime. 09/23/20   [name], Alinda Sierras, MD  metoprolol tartrate (LOPRESSOR) 50 MG tablet Take 1 tablet (50 mg total) by mouth 2 (two) times daily.  03/17/21 03/17/22  [name], NP  montelukast (SINGULAIR) 10 MG tablet TAKE 1 TABLET BY MOUTH AT BEDTIME 12/05/20   [name], Alinda Sierras, MD  Multiple Vitamin (MULTIVITAMIN WITH MINERALS) TABS tablet Take 1 tablet by mouth daily. 03/04/20   Mercy Riding, MD  [facility] VERIO test strip Use 1-4 times daily as directed/needed   DX E11.9 09/29/20   [name], Alinda Sierras, MD  potassium chloride SA (KLOR-CON) 20 MEQ tablet Take 2 tablets once daily 09/23/20   [name], Alinda Sierras, MD  PSYLLIUM HUSK PO Take by mouth. 2 cap twice daily    [provider]  rosuvastatin (CRESTOR) 40 MG tablet Take 1 tablet (40 mg total) by mouth daily. 09/09/20 12/08/20  [name], Alinda Sierras, MD  thiamine (VITAMIN B-1) 100 MG tablet Take 500 mg by mouth daily.    [provider]    Physical Exam: Vitals:   03/20/21 1445 03/20/21 1500 03/20/21 1530 03/20/21 1545  BP: (!) 144/107 (!) 143/108 (!) 142/104 (!) 143/111  Pulse: (!) 140 (!) 140 (!) 140 (!) 140  Resp: (!) 23 (!) 27 (!) 25 (!) 34  Temp:      SpO2: 97% 96% 96% 98%    Constitutional: NAD, calm, comfortable Vitals:   03/20/21 1445 03/20/21 1500 03/20/21 1530 03/20/21 1545  BP: (!) 144/107 (!) 143/108 (!) 142/104 (!) 143/111  Pulse: (!) 140 (!) 140 (!) 140 (!) 140  Resp: (!) 23 (!) 27 (!) 25 (!) 34  Temp:      SpO2: 97% 96% 96% 98%   Eyes: PERRL, lids and conjunctivae normal ENMT: Mucous membranes are moist. Posterior pharynx clear of any exudate or lesions.Normal dentition.  Neck: normal, supple, no masses, no thyromegaly Respiratory: clear to auscultation bilaterally, no wheezing, no crackles. Normal respiratory effort. No accessory muscle use.  Cardiovascular: Irregular heart rate, tachycardia, no murmurs / rubs / gallops. No extremity edema. 2+ pedal pulses. No carotid bruits.  Abdomen: no tenderness, no masses palpated. No hepatosplenomegaly. Bowel sounds positive.  Musculoskeletal: no clubbing / cyanosis. No joint deformity upper and lower  extremities. Good ROM, no contractures. Normal muscle tone.  Skin: no rashes, lesions, ulcers. No induration Neurologic: Left facial droop, flattening of the left frontal wrinkles, speech is slurred. Sensation intact, DTR normal. Strength 5/5 in all 4.  Psychiatric: Normal judgment and insight. Alert and oriented x 3. Normal mood.     Labs on Admission: I have personally reviewed following labs and imaging studies  CBC: Recent Labs  Lab 03/16/21 1100 03/16/21 1112 03/20/21 1317  WBC 7.6  --  6.9  NEUTROABS 4.6  --   --   HGB 12.1* 11.6* 13.0  HCT 36.2* 34.0* 39.3  MCV 95.5  --  95.6  PLT 182  --  938   Basic Metabolic Panel: Recent Labs  Lab 03/16/21 1100 03/16/21 1112 03/20/21 1317  NA 137 138 141  K 3.4* 3.4* 3.8  CL 105 104 109  CO2 19*  --  23  GLUCOSE 100* 103* 119*  BUN '12 11 9  ' CREATININE 0.72 0.60* 0.77  CALCIUM 8.9  --  9.5   GFR: Estimated Creatinine Clearance: 107.2 mL/min (by C-G formula based on SCr of 0.77 mg/dL). Liver Function Tests: Recent Labs  Lab 03/16/21 1100  AST 31  ALT 31  ALKPHOS 51  BILITOT 1.1  PROT 6.6  ALBUMIN 3.4*   Recent Labs  Lab 03/16/21 1100  LIPASE 22   No results for input(s): AMMONIA in the last 168 hours. Coagulation Profile: Recent Labs  Lab 03/16/21 1100  INR 1.2   Cardiac Enzymes: No results for input(s): CKTOTAL, CKMB, CKMBINDEX, TROPONINI in the last 168 hours. BNP (last 3 results) No results for input(s): PROBNP in the last 8760 hours. HbA1C: No results for input(s): HGBA1C in the last 72 hours. CBG: No results for input(s): GLUCAP in the last 168 hours. Lipid Profile: No results for input(s): CHOL, HDL, LDLCALC, TRIG, CHOLHDL, LDLDIRECT in the last 72 hours. Thyroid Function Tests: No results for input(s): TSH, T4TOTAL, FREET4, T3FREE, THYROIDAB in the last 72 hours. Anemia Panel: No results for input(s): VITAMINB12, FOLATE, FERRITIN, TIBC, IRON, RETICCTPCT in the last 72 hours. Urine analysis:     Component Value Date/Time   COLORURINE AMBER (A) 02/20/2020 1957   APPEARANCEUR HAZY (A) 02/20/2020 1957   LABSPEC 1.020 02/20/2020 1957   PHURINE 5.0 02/20/2020 1957   GLUCOSEU 50 (A) 02/20/2020 1957   HGBUR NEGATIVE 02/20/2020 1957   BILIRUBINUR NEGATIVE 02/20/2020 1957   BILIRUBINUR 1+ 08/14/2013 1200   KETONESUR NEGATIVE 02/20/2020 1957   PROTEINUR 30 (A) 02/20/2020 1957   UROBILINOGEN 0.2 08/14/2013 1200   NITRITE NEGATIVE 02/20/2020 1957   LEUKOCYTESUR TRACE (A) 02/20/2020 1957    Radiological Exams on Admission: DG Chest Port 1 View  Result Date: 03/20/2021 CLINICAL DATA:  Shortness of breath EXAM: PORTABLE CHEST 1 VIEW COMPARISON:  03/16/2021 FINDINGS: Low lung volumes. Improved aeration at lung bases. No pleural effusion. No pneumothorax. Stable cardiomediastinal contours. IMPRESSION: Improved aeration at the lung bases. Electronically Signed   By: Macy Mis M.D.   On: 03/20/2021 14:24    EKG: Independently reviewed.  Rapid A. fib  Assessment/Plan Active Problems:   Atrial fibrillation with RVR (HCC)   A-fib (HCC)  (please populate well all problems here in Problem List. (For example, if patient is on BP meds at home and you resume or decide to hold them, it is a problem that needs to be her. Same for CAD, COPD, HLD and so on)  A. fib with RVR -Secondary to noncoherent with Cardizem -Continue Cardizem drip, patient clinically has been compliant with  Eliquis and Tikosyn. -Check TSH -1 dose of p.o. potassium and check magnesium and phosphorus level.  [name] -Onset less than 7 days ago, will start p.o. steroid and valacyclovir for 7 days.  HTN -Controlled, continue losartan and metoprolol  Alcohol abuse -No symptoms signs of acute withdrawal, start CIWA protocol with as needed benzos.  IDDM -Continue home insulin regimen, continue home diabetic medications -Add sliding scale  Morbid obesity -Calorie control  DVT prophylaxis: Eliquis Code Status:  Full code Family Communication: Wife at bedside Disposition Plan: Expect 1 to 2 days hospital stay, cardiology consulted for possible cardioversion if Cardizem not able to control heart rate. Consults called: Cardiology Admission status: PCU   Lequita Halt MD [facility] Pager [phone]  03/20/2021, 3:48 PM

## 2021-03-20 NOTE — Consult Note (Addendum)
Cardiology Consultation:   Patient ID: ICHAEL PULLARA MRN: [id number]; DOB: 01-28-1951  Admit date: 03/20/2021 Date of Consult: 03/20/2021  PCP:  [name], MD   [facility] Providers Cardiologist:  [name], MD (retired) Electrophysiologist:  [name], MD  Sleep Medicine:  Fransico Him, MD  {   Patient Profile:   ALSTON BERRIE is a 70 y.o. male with a hx of CAD (CABG 2011), HTN, HLD, DM, OSA, ETOH abuse (hospitalized 10/24/19 for GI bleeding and alcohol withdrawal  was discharged to rehab for a prolonged period of time for Wernicke's encephalopathy), continues to drink , COPDwho is being seen 03/20/2021 for the evaluation of recurrent AFlutter w/RVR at the request of Dr. Tamera Punt.   AFib HX PVI ablation 03/31/2018, Dr.Allred Initial diagnosis appears post-op 2011 after his CABG though  Had long hiatus without any until 2019) AADHx Tikosyn started 12/13/2017, is current Pt has stated he was on amiodarone back in 2011 briefly, turned his skin purple OAC Hx Xarelto >> GIB >> Eliquis   MDT ILR implanted 07/02/19 for AFib surveillence  History of Present Illness:   Mr. Delisle was in the ER 03/16/21 with slurred speech and R facial droop (reported for 5 days), Head CT showed no acute findings.  MRI was negative.  Which is very reassuring.  "Patient did have a little bit of alcohol on his system but he was not significantly intoxicated" his labs largely unremarkable.  He was in rate controlled afib, uncertain of if the slurred speech was based from long years use of alcohol.  But not felt to have an acute reason for admission and was discharged from the ER.  He saw his PMD today reported medication compliance though had run out of his dilt a few days because his pharmacy was out of it.  Also reported taking 127m BID of his metoprolol (not 557mBID as written). He was planned for outpt neurology referral, though noted tachycardic and referred to the ER.  On  arrival here he was again noted in AFlutter w/RVR, ER MD somewhat uncomfortable DCCV with uncertainty that the patient was being truthful regarding his Eliquis compliance and reached out to EP for assistance. Recommended medicine admission given known ETOH and prior withdrawal issues, and EP would consult, planned to start dilt gtt.. [facility]LABS K+ 3.8 BUN/Creat 9/0.77 HS Trop 15 WBC 6.9 H/H 13/39 Plts 184  03/16/21 ETOH level 12 (reference <10)  03/11/21 TSH 2.110  He tells me his heart has been racing now for "a long time, probably a week.  He is very aware of the racing is uncomfortable.  No CP He is SOB but his wife says he has been SOB for y ears. NO near syncope or syncope.  He continues to drink He has 4 tumbler sized glasses with ICE and scotch (x4) a day and often some wine as well. Had some wine this AM before going to the doctor's office.  His wife noticed his current speech pattern and brought him to the ER as noted above, the patient states that the MD that was in to see him before me thinks may be bells palsy.  He reports medication compliance, when asked if there is any chance that he may miss medicines 2/2 his drinking/forget, or just forget he does sya yes but not regularly, his wife does not monitor his medication compliance but agrees that there likely a change here and there he misses medicines.  For sure they have been  without the diltiazem today is 4th day she says 2/2 an insurance issue that he pharmacy is working on.    Past Medical History:  Diagnosis Date   Allergy    Anxiety    history of PTSD following CABG   Ascending aortic aneurysm ([place]) 01/31/2018   43 x 42 mm, pt unaware   Asthma    Cardiomegaly 10/17/2017   Colitis- colonoscopy 2014 07/13/2015   COPD (chronic obstructive pulmonary disease) ([place])    Coronary artery disease    x 6   Depression    Diabetes mellitus without complication ([place])    Family history of polyps in the colon    Finger  dislocation    Left pinkie   GERD (gastroesophageal reflux disease)    Gout    H/O atrial fibrillation without current medication    following CABG with no documented episodes since then.   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    Past Surgical History:  Procedure Laterality Date   ANKLE FRACTURE SURGERY Right 1991   APPENDECTOMY     ATRIAL FIBRILLATION ABLATION N/A 03/31/2018   Procedure: ATRIAL FIBRILLATION ABLATION;  Surgeon: [name], MD;  Location: [place];  Service: Cardiovascular;  Laterality: N/A;   CARDIAC ELECTROPHYSIOLOGY MAPPING AND ABLATION     COLONOSCOPY W/ BIOPSIES  2017   x7   CORONARY ANGIOPLASTY WITH STENT PLACEMENT     CORONARY ARTERY BYPASS GRAFT     FINGER SURGERY  04/2018   Small finger left hand   implantable loop recorder placement  07/02/2019   Medtronic Reveal [place] model LNQ11 (SN [id number]) implanted in office by Dr Rayann Heman   TEE WITHOUT CARDIOVERSION N/A 03/30/2018   Procedure: TRANSESOPHAGEAL ECHOCARDIOGRAM (TEE);  Surgeon: [name], MD;  Location: [facility];  Service: Cardiovascular;  Laterality: N/A;   TOTAL HIP ARTHROPLASTY Left    TOTAL HIP ARTHROPLASTY Right 09/12/2018   Procedure: TOTAL HIP ARTHROPLASTY ANTERIOR APPROACH;  Surgeon: [name], MD;  Location: [facility];  Service: Orthopedics;  Laterality: Right;  70 mins     Home Medications:  Prior to Admission medications   Medication Sig Start Date End Date Taking? Authorizing Provider  acetaminophen (TYLENOL) 325 MG tablet Take 325 mg by mouth every 8 (eight) hours as needed for moderate pain.   Yes [provider]  allopurinol (ZYLOPRIM) 300 MG tablet TAKE 1 TABLET(300 MG) BY MOUTH DAILY Patient taking  differently: Take 300 mg by mouth daily. 12/05/20  Yes [name], Alinda Sierras, MD  B COMPLEX-C-FOLIC ACID PO Take 1 tablet by mouth daily.   Yes [provider]  diltiazem (CARDIZEM CD) 360 MG 24 hr capsule Take 1 capsule (360 mg total) by mouth daily. 03/20/21  Yes [name], Alinda Sierras, MD  divalproex (DEPAKOTE SPRINKLE) 125 MG capsule Take 1 capsule (125 mg total) by mouth 3 (three) times daily. Patient taking differently: Take 125 mg by mouth 2 (two) times daily. 09/15/20  Yes [name], Alinda Sierras, MD  dofetilide (TIKOSYN) 125 MCG capsule Take 1 capsule (125 mcg total) by mouth 2 (two) times daily. 11/17/20  Yes Allred, Jeneen Rinks, MD  DULoxetine (CYMBALTA) 60 MG capsule TAKE 1 CAPSULE(60 MG) BY  MOUTH DAILY 03/19/21  Yes [name], Alinda Sierras, MD  ELIQUIS 5 MG TABS tablet TAKE 1 TABLET(5 MG) BY MOUTH TWICE DAILY 03/04/21  Yes [name], Alinda Sierras, MD  Fluticasone-Umeclidin-Vilant (TRELEGY ELLIPTA) 100-62.5-25 MCG/INH AEPB Inhale 1 puff into the lungs daily. 11/11/20  Yes [name], MD  gabapentin (NEURONTIN) 300 MG capsule Take 2 capsules at night as needed for restless leg symptoms Patient taking differently: Take 600 mg by mouth at bedtime. 09/23/20  Yes [name], Alinda Sierras, MD  icosapent Ethyl (VASCEPA) 1 g capsule TAKE 2 CAPSULES(2 GRAMS) BY MOUTH TWICE DAILY Patient taking differently: TAKE 2 CAPSULES(2 GRAMS) BY MOUTH DAILY 12/30/20  Yes Martinique, [name], MD  insulin glargine, 2 Unit Dial, (TOUJEO MAX SOLOSTAR) 300 UNIT/ML Solostar Pen Inject 130 Units into the skin daily. 03/05/21  Yes [name], Alinda Sierras, MD  JANUVIA 100 MG tablet TAKE 1 TABLET(100 MG) BY MOUTH DAILY 12/05/20  Yes [name], Alinda Sierras, MD  JARDIANCE 25 MG TABS tablet TAKE 1 TABLET(25 MG) BY MOUTH DAILY BEFORE AND BREAKFAST Patient taking differently: Take 25 mg by mouth daily before breakfast. 03/04/21  Yes [name], Alinda Sierras, MD  Lidocaine HCl 4 % CREA Apply 1 application topically daily as needed (pain).   Yes [provider]   losartan (COZAAR) 100 MG tablet TAKE 1 TABLET(100 MG) BY MOUTH DAILY 03/04/21  Yes [name], Alinda Sierras, MD  Magnesium Oxide 400 MG CAPS Take 1 capsule (400 mg total) by mouth daily. 09/15/20  Yes [name], Alinda Sierras, MD  metFORMIN (GLUCOPHAGE-XR) 500 MG 24 hr tablet Take 1 tablet (500 mg total) by mouth in the morning and at bedtime. 09/23/20  Yes [name], Alinda Sierras, MD  metoprolol tartrate (LOPRESSOR) 50 MG tablet Take 1 tablet (50 mg total) by mouth 2 (two) times daily. Patient taking differently: Take 50 mg by mouth 4 (four) times daily. Take 1 tablet (50 mg) at 0600, 0800, 1800, 2000 03/17/21 03/17/22 Yes [name], NP  montelukast (SINGULAIR) 10 MG tablet TAKE 1 TABLET BY MOUTH AT BEDTIME 12/05/20  Yes [name], Alinda Sierras, MD  Multiple Vitamin (MULTIVITAMIN WITH MINERALS) TABS tablet Take 1 tablet by mouth daily. 03/04/20  Yes Mercy Riding, MD  potassium chloride SA (KLOR-CON) 20 MEQ tablet Take 2 tablets once daily 09/23/20  Yes [name], Alinda Sierras, MD  rosuvastatin (CRESTOR) 40 MG tablet Take 40 mg by mouth daily.   Yes [provider]  thiamine (VITAMIN B-1) 100 MG tablet Take 500 mg by mouth daily.   Yes [provider]  Blood Glucose Monitoring Suppl (ONETOUCH VERIO) w/Device KIT Use 1-4 times daily as needed/directed DX E11.9 09/29/20   [name], Alinda Sierras, MD  Insulin Pen Needle (NOVOFINE PEN NEEDLE) 32G X 6 MM MISC Use 1-4 times daily as needed for insulin 09/15/20   [name], Alinda Sierras, MD  Lancets [facility] ULTRASOFT) lancets Use 1-4 times daily as needed/directed  DX E11.9 09/29/20   [name], Alinda Sierras, MD  [facility] VERIO test strip Use 1-4 times daily as directed/needed   DX E11.9 09/29/20   [name], Alinda Sierras, MD  PSYLLIUM HUSK PO Take by mouth. 2 cap twice daily Patient not taking: No sig reported    [provider]  rosuvastatin (CRESTOR) 40 MG tablet Take 1 tablet (40 mg total) by mouth daily. 09/09/20 12/08/20  [name], Alinda Sierras, MD    Inpatient  Medications: Scheduled Meds:  diltiazem  10 mg Intravenous Once   Continuous Infusions:  diltiazem (CARDIZEM) infusion  PRN Meds:   Allergies:    Allergies  Allergen Reactions   Xarelto [Rivaroxaban] Other (See Comments)    Patient stated he "ended up in the hospital with a rectal bleed"   Amoxicillin Rash and Other (See Comments)    * SEVERE RASH IN GROIN AREA Has patient had a PCN reaction causing immediate rash, facial/tongue/throat swelling, SOB or lightheadedness with hypotension: no Has patient had a PCN reaction causing severe rash involving mucus membranes or skin necrosis: no Has patient had a PCN reaction that required hospitalization: no Has patient had a PCN reaction occurring within the last 10 years: yes If all of the above answers are "NO", then may proceed with Cephalosporin use.    Augmentin [Amoxicillin-Pot Clavulanate] Rash and Other (See Comments)    * SEVERE RASH IN GROIN AREA   Azithromycin Rash and Other (See Comments)    * SEVERE RASH IN GROIN AREA   Clindamycin/Lincomycin Rash   Keflex [Cephalexin] Rash    Social History:   Social History   Socioeconomic History   Marital status: Married    Spouse name: Not on file   Number of children: 0   Years of education: Not on file   Highest education level: Not on file  Occupational History   Occupation: retired  Tobacco Use   Smoking status: Former    Packs/day: 1.00    Years: 7.00    Pack years: 7.00    Types: Cigarettes    Quit date: 10/04/1977    Years since quitting: 43.4   Smokeless tobacco: Never   Tobacco comments:    never smoked over 1 pack   Vaping Use   Vaping Use: Never used  Substance and Sexual Activity   Alcohol use: Yes    Alcohol/week: 3.0 - 4.0 standard drinks    Types: 1 Glasses of wine, 2 - 3 Shots of liquor per week    Comment: DAILY   Drug use: No    Comment: Smoked Marijuana back in 1970s   Sexual activity: Yes  Other Topics Concern   Not on file  Social History  Narrative   Married, no children   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 - returned to [employer] from [place] 2014   Enjoys bird watching, reading historical books about WWII, watching tv   Social Determinants of Radio broadcast assistant Strain: Low Risk    Difficulty of Paying Living Expenses: Not hard at [employer] Insecurity: Not on file  Transportation Needs: No Transportation Needs   Lack of Transportation (Medical): No   Lack of Transportation (Non-Medical): No  Physical Activity: Inactive   Days of Exercise per Week: 0 days   Minutes of Exercise per Session: 0 min  Stress: No Stress Concern Present   Feeling of Stress : Not at all  Social Connections: Moderately Isolated   Frequency of Communication with Friends and Family: More than three times a week   Frequency of Social Gatherings with Friends and Family: Never   Attends Religious Services: Never   Marine scientist or Organizations: No   Attends Music therapist: Never   Marital Status: Married  Human resources officer Violence: Not At Risk   Fear of Current or Ex-Partner: No   Emotionally Abused: No   Physically Abused: No   Sexually Abused: No    Family History:   Family History  Problem Relation Age of Onset   Colon  cancer Mother    Cancer Father        UNKNOWN TYPE   Heart disease Paternal Grandmother    Heart disease Paternal Grandfather      ROS:  Please see the history of present illness.  All other ROS reviewed and negative.     Physical Exam/Data:   Vitals:   03/20/21 1249 03/20/21 1315 03/20/21 1345 03/20/21 1400  BP: (!) 164/124 (!) 160/128 (!) 173/128 (!) 163/117  Pulse: (!) 144 (!) 142 (!) 140 (!) 141  Resp: 16 (!) 30 (!) 25 18  Temp: 98.7 F (37.1 C)     SpO2: 99% 97% 94% 96%   No intake or output data in the 24 hours ending 03/20/21 1503 Last 3 Weights 03/20/2021 03/16/2021 03/11/2021  Weight (lbs) 244 lb 12.8 oz 246 lb 0.5 oz 246 lb  Weight  (kg) 111.041 kg 111.6 kg 111.585 kg     There is no height or weight on file to calculate BMI.  General:  Well nourished, well developed, in no acute distress HEENT: normal Lymph: no adenopathy Neck: no JVD Endocrine:  No thryomegaly Vascular: No carotid bruits  Cardiac:  RRR;  tachycardic, no murmurs, gallops or rubs Lungs:  CTA b/l, no wheezing, rhonchi or rales  Abd: soft, nontender  Ext: no edema Musculoskeletal:  No deformities, Skin: warm and dry  Neuro:  He has a slur to his speech, perhaps slight R facial droop, no clear gross focal motor abnormalities noted, he is AAO x4 Psych:  Normal affect   EKG:  The EKG was personally reviewed and demonstrates:    AFlutter 142bpm AFlutter 142bpm, no acute/ischemic looking changes  OLD: 03/16/21 AFib 97bpm 03/11/21 AFlutter 142 08/22/21 SR 98bpm, QTc 485  Telemetry:  Telemetry was personally reviewed and demonstrates:   AFlutter persistently 140's, has had a couple periods of slight slowing noting flutter waves.  Relevant CV Studies:   02/11/20; TTE IMPRESSIONS   1. Technically difficult study with limited views and patient  uncooperative. Grossly normal LV systolic function. Left ventricular  ejection fraction, by estimation, is 60 to 65%. Image quality is not  sufficient to assess for regional wall motiuon  abnormalities. There is mild left ventricular hypertrophy. Left  ventricular diastolic parameters are indeterminate.   2. Right ventricle is pooly visualized but grossly normal size with  mildly reduced systolic function.   3. The mitral valve is normal in structure. Mild mitral valve  regurgitation.   4. The aortic valve was not well visualized. Aortic valve regurgitation  is not visualized. No aortic stenosis is present.   10/31/2017 EPS/Ablation CONCLUSIONS: 1. Atrial fibrillation upon presentation.   2. Intracardiac echo reveals a moderate sized left atrium with four separate pulmonary veins without evidence of  pulmonary vein stenosis. 3. Successful electrical isolation and anatomical encircling of all four pulmonary veins with radiofrequency current.  A WACA approach was used 3. Additional left atrial ablation was performed with a standard box lesion created along the posterior wall of the left atrium 4. Atrial fibrillation successfully cardioverted to sinus rhythm. 5. No early apparent complications.     10/30/2017: TEE Study Conclusions - Left ventricle: The cavity size was normal. Wall thickness was   normal. Systolic function was normal. The estimated ejection   fraction was in the range of 55% to 60%. Wall motion was normal;   there were no regional wall motion abnormalities. - Mitral valve: Mildly calcified annulus. - Left atrium: No evidence of thrombus in the atrial  cavity or   appendage. No spontaneous echo contrast was observed. The   appendage was morphologically a left appendage, multilobulated,   and of normal size. Emptying velocity was mildly reduced. - Right atrium: No evidence of thrombus in the atrial cavity or   appendage. - Atrial septum: There was a very small patent foramen ovale.   Doppler showed a trivial left-to-right shunt, in the baseline   state.  Laboratory Data:  High Sensitivity Troponin:   Recent Labs  Lab 03/20/21 1317  TROPONINIHS 15     Chemistry Recent Labs  Lab 03/16/21 1100 03/16/21 1112 03/20/21 1317  NA 137 138 141  K 3.4* 3.4* 3.8  CL 105 104 109  CO2 19*  --  23  GLUCOSE 100* 103* 119*  BUN _0 CREATININE 0.72 0.60* 0.77  CALCIUM 8.9  --  9.5  GFRNONAA >60  --  >60  ANIONGAP 13  --  9    Recent Labs  Lab 03/16/21 1100  PROT 6.6  ALBUMIN 3.4*  AST 31  ALT 31  ALKPHOS 51  BILITOT 1.1   Hematology Recent Labs  Lab 03/16/21 1100 03/16/21 1112 03/20/21 1317  WBC 7.6  --  6.9  RBC 3.79*  --  4.11*  HGB 12.1* 11.6* 13.0  HCT 36.2* 34.0* 39.3  MCV 95.5  --  95.6  MCH 31.9  --  31.6  MCHC 33.4  --  33.1  RDW 14.7   --  15.0  PLT 182  --  184   BNPNo results for input(s): BNP, PROBNP in the last 168 hours.  DDimer No results for input(s): DDIMER in the last 168 hours.   Radiology/Studies:  DG Chest Port 1 View  Result Date: 03/20/2021 CLINICAL DATA:  Shortness of breath EXAM: PORTABLE CHEST 1 VIEW COMPARISON:  03/16/2021 FINDINGS: Low lung volumes. Improved aeration at lung bases. No pleural effusion. No pneumothorax. Stable cardiomediastinal contours. IMPRESSION: Improved aeration at the lung bases. Electronically Signed   By: Macy Mis M.D.   On: 03/20/2021 14:24     Assessment and Plan:   Paroxysmal AFib, AFlutter       RVR Prior PVI ablation, 2019 Tikosyn   Likely best to consider alternative to Tikosyn with recurrent breakthrough arrhythmias and ongoing ETOH and medication noncompliance.   Stop Tiksoyn BP and HR are high Getting dilt gtt IV lopressor dose written and increased his PO TO TID  Historically he had reported skin discoloration with amiodarone I am not certain that we have an alternative, Mileidy Atkin discuss with MD Agree for now with diltiazem gtt and Marin Milley plan for TEE/DCCV Monday  2. CAD No CP HS Trop 15 > 16, likely 2/2 RVR Home meds continued (except statin, Zaviyar Rahal defer this to IM/attending)  3. HTN Uncontrolled Pending IV lopressor, PRN hydralazine added    Further as per IM/attending MD      Risk Assessment/Risk Scores:   For questions or updates, please contact [place] Please consult [email or web address] for contact info under    Signed, Baldwin Jamaica, PA-C  03/20/2021 3:03 PM  I have seen and examined this patient with Tommye Standard.  Agree with above, note added to reflect my findings.  On exam, tachycardic, no murmurs.  Patient has a longstanding history of atrial fibrillation and atrial flutter.  He is on Tikosyn.  He is status post atrial fibrillation ablation in 2019.  He also has a history of alcohol abuse and has been to rehab.  He  went into  atrial fibrillation, based on his Linq monitor, a few weeks ago.  This morning, he had a glass of wine and went to his primary physician's office.  At his primary physician's office he was noted to be quite tachycardic and referred to the emergency room.  He had apparently been out of his diltiazem.  As he had been drinking quite a bit, he is unsure of whether or not he has been anticoagulated.  We Gad Aymond plan for TEE and cardioversion likely Monday.  Arinze Rivadeneira M. Kayana Thoen MD 03/20/2021 6:01 PM

## 2021-03-20 NOTE — ED Triage Notes (Signed)
Patient sent to ED from PCP for evaluation of tachycardia, heart rate 140-155. History of afib, takes eliquis, denies chest pain, reports shortness of breath. Patient alert, oriented, and in no apparent distress at this time.

## 2021-03-20 NOTE — Progress Notes (Signed)
Established Patient Office Visit  Subjective:  Patient ID: Adam [name], male    DOB: Feb 20, 1951  Age: 70 y.o. MRN: [id number]  CC:  Chief Complaint  Patient presents with   Hospitalization Follow-up    HPI  Adam [name] presents for ER follow-up.  Multiple chronic problems including history of atrial fibrillation, hypertension, CAD, thoracic aortic aneurysm, COPD, alcohol abuse, type 2 diabetes.  Was seen in the ED on 13 June with question of slurred speech and right facial droop.  This apparently had been going on for several days.  He states he has been compliant with all his medications except for running out of diltiazem a few days ago.  Still drinks alcohol.  Head CT showed no acute findings.  MRI showed no acute findings and no evidence for stroke.  Potassium slightly low 3.4.  COVID testing and influenza screening negative.  Chest x-ray no acute findings.  EKG showed A. fib with controlled rate.  He denied any focal extremity weakness.  Still drinking alcohol but no evidence for recent acute withdrawal.  Patient states he feels very poorly in general.  Fatigued. No chest pain.  He states his heart rate has been very high the past few days.  He did state ran out of his diltiazem a few days ago and he states his pharmacy was out.  Does take metoprolol 50 mg twice daily and this is listed as twice daily on his med list but he states he is taking actually 2 tablets twice daily.  Recent TSH normal.  Last echo 5/21 with normal EF.  Past Medical History:  Diagnosis Date   Allergy    Anxiety    history of PTSD following CABG   Ascending aortic aneurysm ([place]) 01/31/2018   43 x 42 mm, pt unaware   Asthma    Cardiomegaly 10/17/2017   Colitis- colonoscopy 2014 07/13/2015   COPD (chronic obstructive pulmonary disease) ([place])    Coronary artery disease    x 6   Depression    Diabetes mellitus without complication ([place])    Family history of polyps in the colon    Finger dislocation     Left pinkie   GERD (gastroesophageal reflux disease)    Gout    H/O atrial fibrillation without current medication    following CABG with no documented episodes since then.   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    Past Surgical History:  Procedure Laterality Date   ANKLE FRACTURE SURGERY Right 1991   APPENDECTOMY     ATRIAL FIBRILLATION ABLATION N/A 03/31/2018   Procedure: ATRIAL FIBRILLATION ABLATION;  Surgeon: [name], MD;  Location: [facility];  Service: Cardiovascular;  Laterality: N/A;   CARDIAC ELECTROPHYSIOLOGY MAPPING AND ABLATION     COLONOSCOPY W/ BIOPSIES  2017   x7   CORONARY ANGIOPLASTY WITH STENT PLACEMENT     CORONARY ARTERY BYPASS GRAFT     FINGER SURGERY  04/2018   Small finger left hand   implantable loop recorder placement  07/02/2019   Medtronic Reveal [place] model LNQ11 (SN [id number]) implanted in office by Dr Rayann Heman   TEE WITHOUT CARDIOVERSION N/A 03/30/2018  Procedure: TRANSESOPHAGEAL ECHOCARDIOGRAM (TEE);  Surgeon: [name], MD;  Location: [facility];  Service: Cardiovascular;  Laterality: N/A;   TOTAL HIP ARTHROPLASTY Left    TOTAL HIP ARTHROPLASTY Right 09/12/2018   Procedure: TOTAL HIP ARTHROPLASTY ANTERIOR APPROACH;  Surgeon: [name], MD;  Location: [facility];  Service: Orthopedics;  Laterality: Right;  70 mins    Family History  Problem Relation Age of Onset   Colon cancer Mother    Cancer Father        UNKNOWN TYPE   Heart disease Paternal Grandmother    Heart disease Paternal Grandfather     Social History   Socioeconomic History   Marital status: Married    Spouse name: Not on file   Number of children: 0   Years of education: Not on file   Highest education  level: Not on file  Occupational History   Occupation: retired  Tobacco Use   Smoking status: Former    Packs/day: 1.00    Years: 7.00    Pack years: 7.00    Types: Cigarettes    Quit date: 10/04/1977    Years since quitting: 43.4   Smokeless tobacco: Never   Tobacco comments:    never smoked over 1 pack   Vaping Use   Vaping Use: Never used  Substance and Sexual Activity   Alcohol use: Yes    Alcohol/week: 3.0 - 4.0 standard drinks    Types: 1 Glasses of wine, 2 - 3 Shots of liquor per week    Comment: DAILY   Drug use: No    Comment: Smoked Marijuana back in 1970s   Sexual activity: Yes  Other Topics Concern   Not on file  Social History Narrative   Married, no children   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 - returned to [employer] from [place] 2014   Enjoys bird watching, reading historical books about WWII, watching tv   Social Determinants of Radio broadcast assistant Strain: Low Risk    Difficulty of Paying Living Expenses: Not hard at [employer] Insecurity: Not on file  Transportation Needs: No Transportation Needs   Lack of Transportation (Medical): No   Lack of Transportation (Non-Medical): No  Physical Activity: Inactive   Days of Exercise per Week: 0 days   Minutes of Exercise per Session: 0 min  Stress: No Stress Concern Present   Feeling of Stress : Not at all  Social Connections: Moderately Isolated   Frequency of Communication with Friends and Family: More than three times a week   Frequency of Social Gatherings with Friends and Family: Never   Attends Religious Services: Never   Marine scientist or Organizations: No   Attends Music therapist: Never   Marital Status: Married  Human resources officer Violence: Not At Risk   Fear of Current or Ex-Partner: No   Emotionally Abused: No   Physically Abused: No   Sexually Abused: No    Outpatient Medications Prior to Visit  Medication Sig Dispense Refill    acetaminophen (TYLENOL) 325 MG tablet Take 325 mg by mouth every 8 (eight) hours as needed.     allopurinol (ZYLOPRIM) 300 MG tablet TAKE 1 TABLET(300 MG) BY MOUTH DAILY (Patient taking differently: Take 300 mg by mouth daily.) 90 tablet 3   B COMPLEX-C-FOLIC ACID PO Take by mouth daily.     Blood Glucose Monitoring Suppl (ONETOUCH VERIO) w/Device KIT Use 1-4 times daily  as needed/directed DX E11.9 1 kit 0   divalproex (DEPAKOTE SPRINKLE) 125 MG capsule Take 1 capsule (125 mg total) by mouth 3 (three) times daily. 270 capsule 1   dofetilide (TIKOSYN) 125 MCG capsule Take 1 capsule (125 mcg total) by mouth 2 (two) times daily. 180 capsule 3   DULoxetine (CYMBALTA) 60 MG capsule TAKE 1 CAPSULE(60 MG) BY MOUTH DAILY 90 capsule 1   ELIQUIS 5 MG TABS tablet TAKE 1 TABLET(5 MG) BY MOUTH TWICE DAILY 180 tablet 1   Fluticasone-Umeclidin-Vilant (TRELEGY ELLIPTA) 100-62.5-25 MCG/INH AEPB Inhale 1 puff into the lungs daily. 60 each 3   gabapentin (NEURONTIN) 300 MG capsule Take 2 capsules at night as needed for restless leg symptoms 180 capsule 3   icosapent Ethyl (VASCEPA) 1 g capsule TAKE 2 CAPSULES(2 GRAMS) BY MOUTH TWICE DAILY (Patient taking differently: TAKE 2 CAPSULES(2 GRAMS) BY MOUTH DAILY) 180 capsule 3   insulin glargine, 2 Unit Dial, (TOUJEO MAX SOLOSTAR) 300 UNIT/ML Solostar Pen Inject 130 Units into the skin daily. 9 mL 3   Insulin Pen Needle (NOVOFINE PEN NEEDLE) 32G X 6 MM MISC Use 1-4 times daily as needed for insulin 100 each 3   JANUVIA 100 MG tablet TAKE 1 TABLET(100 MG) BY MOUTH DAILY 90 tablet 1   JARDIANCE 25 MG TABS tablet TAKE 1 TABLET(25 MG) BY MOUTH DAILY BEFORE AND BREAKFAST (Patient taking differently: Take 25 mg by mouth daily before breakfast.) 90 tablet 1   Lancets (ONETOUCH ULTRASOFT) lancets Use 1-4 times daily as needed/directed  DX E11.9 200 each 12   Lidocaine HCl 4 % CREA Apply topically as needed.     losartan (COZAAR) 100 MG tablet TAKE 1 TABLET(100 MG) BY MOUTH DAILY 90  tablet 1   Magnesium Oxide 400 MG CAPS Take 1 capsule (400 mg total) by mouth daily. 90 capsule 1   metFORMIN (GLUCOPHAGE-XR) 500 MG 24 hr tablet Take 1 tablet (500 mg total) by mouth in the morning and at bedtime. 180 tablet 3   metoprolol tartrate (LOPRESSOR) 50 MG tablet Take 1 tablet (50 mg total) by mouth 2 (two) times daily. 60 tablet 3   montelukast (SINGULAIR) 10 MG tablet TAKE 1 TABLET BY MOUTH AT BEDTIME 90 tablet 3   Multiple Vitamin (MULTIVITAMIN WITH MINERALS) TABS tablet Take 1 tablet by mouth daily.     ONETOUCH VERIO test strip Use 1-4 times daily as directed/needed   DX E11.9 200 each 12   potassium chloride SA (KLOR-CON) 20 MEQ tablet Take 2 tablets once daily 180 tablet 3   PSYLLIUM HUSK PO Take by mouth. 2 cap twice daily     rosuvastatin (CRESTOR) 40 MG tablet Take 1 tablet (40 mg total) by mouth daily. 90 tablet 3   thiamine (VITAMIN B-1) 100 MG tablet Take 500 mg by mouth daily.     diltiazem (CARDIZEM CD) 360 MG 24 hr capsule Take 1 capsule (360 mg total) by mouth daily. 90 capsule 3   No facility-administered medications prior to visit.    Allergies  Allergen Reactions   Xarelto [Rivaroxaban] Other (See Comments)    Patient stated he "ended up in the hospital with a rectal bleed"   Amoxicillin Rash and Other (See Comments)    * SEVERE RASH IN GROIN AREA Has patient had a PCN reaction causing immediate rash, facial/tongue/throat swelling, SOB or lightheadedness with hypotension: no Has patient had a PCN reaction causing severe rash involving mucus membranes or skin necrosis: no Has patient had a PCN  reaction that required hospitalization: no Has patient had a PCN reaction occurring within the last 10 years: yes If all of the above answers are "NO", then may proceed with Cephalosporin use.    Augmentin [Amoxicillin-Pot Clavulanate] Rash and Other (See Comments)    * SEVERE RASH IN GROIN AREA   Azithromycin Rash and Other (See Comments)    * SEVERE RASH IN GROIN  AREA   Clindamycin/Lincomycin Rash   Keflex [Cephalexin] Rash    ROS Review of Systems  Constitutional:  Negative for chills and fever.  Respiratory:  Negative for cough.   Cardiovascular:  Positive for palpitations. Negative for chest pain.  Neurological:  Negative for dizziness and syncope.     Objective:    Physical Exam Vitals reviewed.  Constitutional:      Comments: Slightly diaphoretic but in no distress at rest.  Cardiovascular:     Comments: Tachycardic with heart rate auscultated around 140 Pulmonary:     Breath sounds: No wheezing or rales.  Musculoskeletal:     Comments: No significant pitting edema  Neurological:     Mental Status: He is alert.     Comments: Does still have question of slight right facial droop at the corner of the mouth.  His speech is relatively clear.  No focal weakness otherwise.    BP (!) 150/90 (BP Location: Left Arm, Patient Position: Sitting, Cuff Size: Normal)   Pulse (!) 146   Temp 97.8 F (36.6 C) (Oral)   Wt 244 lb 12.8 oz (111 kg)   SpO2 97%   BMI 35.13 kg/m  Wt Readings from Last 3 Encounters:  03/20/21 244 lb 12.8 oz (111 kg)  03/16/21 246 lb 0.5 oz (111.6 kg)  03/11/21 246 lb (111.6 kg)     Health Maintenance Due  Topic Date Due   TETANUS/TDAP  10/07/2019   FOOT EXAM  09/23/2020   COLONOSCOPY (Pts 45-38yr Insurance coverage will need to be confirmed)  11/19/2020   COVID-19 Vaccine (4 - Booster for PCerescoseries) 12/27/2020    There are no preventive care reminders to display for this patient.  Lab Results  Component Value Date   TSH 2.110 03/11/2021   Lab Results  Component Value Date   WBC 7.6 03/16/2021   HGB 11.6 (L) 03/16/2021   HCT 34.0 (L) 03/16/2021   MCV 95.5 03/16/2021   PLT 182 03/16/2021   Lab Results  Component Value Date   NA 138 03/16/2021   K 3.4 (L) 03/16/2021   CO2 19 (L) 03/16/2021   GLUCOSE 103 (H) 03/16/2021   BUN 11 03/16/2021   CREATININE 0.60 (L) 03/16/2021   BILITOT 1.1  03/16/2021   ALKPHOS 51 03/16/2021   AST 31 03/16/2021   ALT 31 03/16/2021   PROT 6.6 03/16/2021   ALBUMIN 3.4 (L) 03/16/2021   CALCIUM 8.9 03/16/2021   ANIONGAP 13 03/16/2021   EGFR 96 12/08/2020   GFR 75.74 12/03/2019   Lab Results  Component Value Date   CHOL 137 09/23/2020   Lab Results  Component Value Date   HDL 40 09/23/2020   Lab Results  Component Value Date   LDLCALC 64 09/23/2020   Lab Results  Component Value Date   TRIG 280 (H) 09/23/2020   Lab Results  Component Value Date   CHOLHDL 3.4 09/23/2020   Lab Results  Component Value Date   HGBA1C 6.4 12/22/2020      Assessment & Plan:   #1 recent ER visit for question of right  facial droop and slurred speech.  CT head and MRI showed no acute findings. No easy muscle fatigability to suggest Myasthenia.  Has history of chronic alcohol abuse but doubt acute withdrawal  Set up Neurology referral Check carotid dopplers.  #2 history of A. fib with rapid ventricular response.  EKG today shows tachycardia with rate around 140s with question of atrial flutter.  Patient states that he ran out of his diltiazem a few days ago but had doubled up his metoprolol. I am very concerned that he will not be tolerant of this rate for long.  Patient denies any chest pains.  In no distress at rest but dyspneic with minimal activity  -Recommend ER evaluation to address tachycardia issue. -He states he was having difficulty getting his Cardizem due to unavailability at his current pharmacy.  We did send in prescription to another pharmacy -Needs close follow-up with cardiology -Continue Eliquis 5 mg twice daily   Meds ordered this encounter  Medications   diltiazem (CARDIZEM CD) 360 MG 24 hr capsule    Sig: Take 1 capsule (360 mg total) by mouth daily.    Dispense:  30 capsule    Refill:  0    Follow-up: No follow-ups on file.    Carolann Littler, MD

## 2021-03-20 NOTE — Progress Notes (Signed)
   03/20/21 1635  Vitals  Temp 98 F (36.7 C)  Temp Source Oral  BP (!) 171/134  MAP (mmHg) 146  BP Location Right Arm  BP Method Automatic  Patient Position (if appropriate) Sitting  Pulse Rate (!) 143  Pulse Rate Source Monitor  Resp 20  MEWS COLOR  MEWS Score Color Yellow  Oxygen Therapy  SpO2 96 %  O2 Device Nasal Cannula  MEWS Score  MEWS Temp 0  MEWS Systolic 0  MEWS Pulse 3  MEWS RR 0  MEWS LOC 0  MEWS Score 3  Baseline Mews score

## 2021-03-21 ENCOUNTER — Encounter (HOSPITAL_COMMUNITY): Payer: Self-pay | Admitting: Internal Medicine

## 2021-03-21 DIAGNOSIS — I5033 Acute on chronic diastolic (congestive) heart failure: Secondary | ICD-10-CM

## 2021-03-21 DIAGNOSIS — I4819 Other persistent atrial fibrillation: Secondary | ICD-10-CM

## 2021-03-21 DIAGNOSIS — I4891 Unspecified atrial fibrillation: Secondary | ICD-10-CM | POA: Diagnosis not present

## 2021-03-21 LAB — BASIC METABOLIC PANEL
Anion gap: 10 (ref 5–15)
BUN: 11 mg/dL (ref 8–23)
CO2: 25 mmol/L (ref 22–32)
Calcium: 9.4 mg/dL (ref 8.9–10.3)
Chloride: 104 mmol/L (ref 98–111)
Creatinine, Ser: 0.8 mg/dL (ref 0.61–1.24)
GFR, Estimated: >60 mL/min (ref >60)
Glucose, Bld: 104 mg/dL — ABNORMAL HIGH (ref 70–99)
Potassium: 3.9 mmol/L (ref 3.5–5.1)
Sodium: 139 mmol/L (ref 135–145)

## 2021-03-21 LAB — GLUCOSE, CAPILLARY
Glucose-Capillary: 126 mg/dL — ABNORMAL HIGH (ref 70–99)
Glucose-Capillary: 163 mg/dL — ABNORMAL HIGH (ref 70–99)
Glucose-Capillary: 149 mg/dL — ABNORMAL HIGH (ref 70–99)
Glucose-Capillary: 152 mg/dL — ABNORMAL HIGH (ref 70–99)

## 2021-03-21 MED ORDER — GUAIFENESIN-DM 100-10 MG/5ML PO SYRP
5.0000 mL | ORAL_SOLUTION | ORAL | Status: DC | PRN
  Administered 2021-03-21 – 2021-03-22 (×3): 5 mL via ORAL
  Filled 2021-03-21 (×3): qty 5

## 2021-03-21 MED ORDER — FUROSEMIDE 10 MG/ML IJ SOLN
40.0000 mg | Freq: Once | INTRAMUSCULAR | Status: AC
  Administered 2021-03-21: 40 mg via INTRAVENOUS
  Filled 2021-03-21: qty 4

## 2021-03-21 NOTE — Progress Notes (Signed)
Patient Name: DONYELL DING      Patient Profile:     Adam [name] is a 70 y.o. male with a hx of CAD (CABG 2011), HTN, HLD, DM, OSA, ETOH abuse (hospitalized 10/24/19 for GI bleeding and alcohol withdrawal  was discharged to rehab for a prolonged period of time for Wernicke's encephalopathy), continues to drink , COPD een 03/20/2021 for the evaluation of recurrent AFlutter w/RVR; hx of aFib ablation ([facility]) 2019  Echo 5/21 EF 60-65% Dofetilide discontinued and amiodarone started with concerns re anticoagulation compliance prompting plan TEEDCCV    SUBJECTIVE: Difficulty breathing during the night during a coughing spell.  Mostly resolved.  Previously exposed to amiodarone.  Past Medical History:  Diagnosis Date   Allergy    Anxiety    history of PTSD following CABG   Ascending aortic aneurysm ([place]) 01/31/2018   43 x 42 mm, pt unaware   Asthma    Cardiomegaly 10/17/2017   Colitis- colonoscopy 2014 07/13/2015   COPD (chronic obstructive pulmonary disease) ([place])    Coronary artery disease    x 6   Depression    Diabetes mellitus without complication ([place])    Family history of polyps in the colon    Finger dislocation    Left pinkie   GERD (gastroesophageal reflux disease)    Gout    H/O atrial fibrillation without current medication    following CABG with no documented episodes since then.   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    Scheduled Meds:  Scheduled Meds:  allopurinol  300 mg Oral Daily   amiodarone  400 mg Oral BID   apixaban  5 mg Oral BID   divalproex  125 mg Oral TID   DULoxetine  60 mg Oral Daily   empagliflozin  25 mg Oral QAC breakfast   umeclidinium bromide  1 puff Inhalation Daily   And    fluticasone furoate-vilanterol  1 puff Inhalation Daily   folic acid  1 mg Oral Daily   gabapentin  600 mg Oral QHS   icosapent Ethyl  2 g Oral BID   insulin aspart  0-15 Units Subcutaneous TID WC   insulin glargine  80 Units Subcutaneous Daily   linagliptin  5 mg Oral Daily   losartan  100 mg Oral Daily   magnesium oxide  400 mg Oral Daily   metFORMIN  500 mg Oral BID WC   metoprolol tartrate  50 mg Oral TID   montelukast  10 mg Oral QHS   multivitamin with minerals  1 tablet Oral Daily   potassium chloride SA  10 mEq Oral Daily   predniSONE  60 mg Oral Q breakfast   thiamine  500 mg Oral Daily   valACYclovir  1,000 mg Oral TID   Continuous Infusions:  diltiazem (CARDIZEM) infusion 15 mg/hr (03/21/21 0650)   acetaminophen, hydrALAZINE, LORazepam **OR** LORazepam, ondansetron (ZOFRAN) IV    PHYSICAL EXAM Vitals:   03/20/21 2346 03/21/21 0033 03/21/21 0505 03/21/21 0747  BP: (!) 144/90  (!) 143/87   Pulse:      Resp: 20  19   Temp: 98.1 F (36.7 C)  98.4 F (36.9 C)   TempSrc: Oral  Oral   SpO2: 99%  98% 95%  Weight:  108.9 kg    Height:         Well developed and nourished in no acute distress HENT normal Neck supple with JVP-flat Carotids brisk and full without bruits Clear Irregularly irregular rate and rhythm with rapid ventricular response, no murmurs or gallops Abd-soft with active BS without hepatomegaly No Clubbing cyanosis edema Skin-warm and dry A & Oriented  Grossly normal sensory and motor function speech is somewhat slurred   TELEMETRY: Reviewed personnally pt in aflut/fib HR 70-140 :  ECG personally reviewed no new    Intake/Output Summary (Last 24 hours) at 03/21/2021 0827 Last data filed at 03/21/2021 0515 Gross per 24 hour  Intake 1229.99 ml  Output 1300 ml  Net -70.01 ml    LABS: Basic Metabolic Panel: Recent Labs  Lab 03/16/21 1100 03/16/21 1112 03/20/21 1317 03/20/21 1650 03/21/21 0118  NA 137 138 141  --  139  K 3.4* 3.4* 3.8   --  3.9  CL 105 104 109  --  104  CO2 19*  --  23  --  25  GLUCOSE 100* 103* 119*  --  104*  BUN 12 11 9   --  11  CREATININE 0.72 0.60* 0.77  --  0.80  CALCIUM 8.9  --  9.5  --  9.4  MG  --   --   --  1.9  --   PHOS  --   --   --  3.2  --    Cardiac Enzymes: No results for input(s): CKTOTAL, CKMB, CKMBINDEX, TROPONINI in the last 72 hours. CBC: Recent Labs  Lab 03/16/21 1100 03/16/21 1112 03/20/21 1317  WBC 7.6  --  6.9  NEUTROABS 4.6  --   --   HGB 12.1* 11.6* 13.0  HCT 36.2* 34.0* 39.3  MCV 95.5  --  95.6  PLT 182  --  184    BNP: BNP (last 3 results) Recent Labs    03/20/21 1317  BNP 286.3*    ProBNP (last 3 results) No results for input(s): PROBNP in the last 8760 hours.  D-Dimer: No results for input(s): DDIMER in the last 72 hours. Hemoglobin A1C: No results for input(s): HGBA1C in the last 72 hours. Fasting Lipid Panel: No results for input(s): CHOL, HDL, LDLCALC, TRIG, CHOLHDL, LDLDIRECT in the last 72 hours. Thyroid Function Tests: Recent Labs    03/20/21 1650  TSH 2.034      ASSESSMENT AND PLAN:  ATrial fib/flutter with RVR  Prior PVI, prev dofetilide now being started on amio   EtOH abuse  HFpEF  HTN    CAD with prior CABG  OSA  Wernickes encephalopathy  COPD     Continue amio,  transaminases mostly normal over recent months/ TSH also normal as baseline for amio   BP elevated, but not excessively so in the context of EtOH withdrawal will hold on med modification   Should resume statin prior to discharge  With coughing spell and elevated BNP, lung exam be difficult, will give a dose of Lasix        Signed, Virl Axe MD  03/21/2021

## 2021-03-21 NOTE — Social Work (Signed)
CSW acknowledges SA consult however pt is confused and agitated. Unable to provide resources at this time.

## 2021-03-21 NOTE — Progress Notes (Signed)
  PROGRESS NOTE  Notified that patient agitated and confused, not directable.  He is going through alcohol withdrawal.  Patient was reevaluated at bedside.  Wife is here.  He received IV Ativan, was just placed on two-point wrist restraints.  He is sleeping.  Wife states that patient usually drinks 3 "jugs" of scotch and 6 bottles of wine a week, has been an alcoholic for about 6 to 8 years with no interest in quitting.  Continue CIWA and IV Ativan Continue bilateral wrist restraints for safety Sitter at bedside if wife leaves and is unable to stay Seizure precaution pads TOC consulted for substance abuse counseling    Dessa Phi, DO [facility] 03/21/2021, 2:25 PM  Available via Epic secure chat 7am-7pm After these hours, please refer to coverage provider listed on [email or web address]

## 2021-03-21 NOTE — Progress Notes (Signed)
Adam NOTE    [name]  FIE:[id number] DOB: 25-Mar-1951 DOA: 03/20/2021 PCP: [name], MD     Brief Narrative:  Adam [name] is a 70 y.o. male with medical history significant of chronic A. fib on Tikosyn and Eliquis, CAD status post stenting, IDDM, HTN, HLD, came with palpitations.   Patient has not been able to take Cardizem for last 3 days due to insurance issue.  And this morning he started to have palpitations and was found heart rate in the 150s.  Denies any chest pain, no shortness of breath.  He is a chronic alcohol drinker, last drink was this morning with a half a glass of wine.   Three days ago, patient developed acute onset of left-sided facial droop, and drooling and slurred speech, went to see PCP who ordered a brain MRI which showed negative for acute stroke.  Denies any weakness or numbness of any of the limbs, no hearing changes no blurry vision.   ED Course: Heart rate in the 140s, EKG showed uncontrolled A. fib, blood pressure stable, no hypoxia chest x-ray clean.  Cardiology consulted, recommend Cardizem drip and admission.  Patient is scheduled for cardioversion on Monday 6/20.  New events last 24 hours / Subjective: Patient complains of productive cough of clear and cloudy sputum.  Denies any shortness of breath.  Does feel some palpitations intermittently.  No nausea, vomiting or abdominal pain.  Assessment & Plan:   Active Problems:   Atrial fibrillation with rapid ventricular response (HCC)   A-fib (HCC)   A. fib RVR -Remains on Cardizem drip, Eliquis, amiodarone -Cardiology following -Plan for cardioversion on Monday  [name] -Started on steroid, valacyclovir  Hypertension -Continue losartan, metoprolol  Alcohol abuse -CIWA protocol for withdrawal  Insulin-dependent diabetes -Continue Lantus, sliding scale insulin, Jardiance, Tradjenta  Mood disorder -Continue Depakote, Cymbalta  Obesity -Estimated body mass index  is 34.45 kg/m as calculated from the following:   Height as of this encounter: 5\' 10"  (1.778 m).   Weight as of this encounter: 108.9 kg.   DVT prophylaxis:   apixaban (ELIQUIS) tablet 5 mg  Code Status:     Code Status Orders  (From admission, onward)           Start     Ordered   03/20/21 1546  Do not attempt resuscitation (DNR)  Continuous       Question Answer Comment  In the event of cardiac or respiratory ARREST Do not call a "code blue"   In the event of cardiac or respiratory ARREST Do not perform Intubation, CPR, defibrillation or ACLS   In the event of cardiac or respiratory ARREST Use medication by any route, position, wound care, and other measures to relive pain and suffering. May use oxygen, suction and manual treatment of airway obstruction as needed for comfort.      03/20/21 1545           Code Status History     Date Active Date Inactive Code Status Order ID Comments User Context   02/25/2020 0739 03/03/2020 1927 DNR [id number]  Annamaria Helling Inpatient   02/20/2020 1922 02/25/2020 0739 DNR [id number]  Elwyn Reach, MD ED   02/14/2020 0839 02/18/2020 2259 DNR [id number]  Domenic Polite, MD Inpatient   02/08/2020 2246 02/14/2020 0839 Full Code [id number]  [name], MD ED   10/25/2019 0015 10/27/2019 2015 Full Code [id number]  [name], MD ED   09/12/2018 1829 09/13/2018  1905 Full Code [id number]  [name] Inpatient   03/31/2018 1630 04/01/2018 0015 Full Code [id number]  [name], MD Inpatient   12/12/2017 1624 12/18/2017 1801 Full Code [id number]  [name], MD Inpatient   12/12/2017 1622 12/12/2017 1624 Full Code [id number]  [name], MD Inpatient      Family Communication: None at bedside Disposition Plan:  Status is: Inpatient  Remains inpatient appropriate because:Hemodynamically unstable, Ongoing diagnostic testing needed not appropriate for outpatient work up, IV treatments appropriate due to  intensity of illness or inability to take PO, and Inpatient level of care appropriate due to severity of illness  Dispo: The patient is from: Home              Anticipated d/c is to: Home              Patient currently is not medically stable to d/c.   Difficult to place patient No      Consultants:  Cardiology   Procedures:  None  Antimicrobials:  Anti-infectives (From admission, onward)    Start     Dose/Rate Route Frequency Ordered Stop   03/20/21 1600  valACYclovir (VALTREX) tablet 1,000 mg        1,000 mg Oral 3 times daily 03/20/21 1546          Objective: Vitals:   03/21/21 0033 03/21/21 0505 03/21/21 0747 03/21/21 0800  BP:  (!) 143/87  118/87  Pulse:    93  Resp:  19  18  Temp:  98.4 F (36.9 C)  98.7 F (37.1 C)  TempSrc:  Oral  Oral  SpO2:  98% 95% 93%  Weight: 108.9 kg     Height:        Intake/Output Summary (Last 24 hours) at 03/21/2021 1126 Last data filed at 03/21/2021 1001 Gross per 24 hour  Intake 1709.99 ml  Output 1300 ml  Net 409.99 ml   Filed Weights   03/20/21 1955 03/21/21 0033  Weight: 111.4 kg 108.9 kg    Examination:  General exam: Appears calm and comfortable  Respiratory system: Clear to auscultation. Respiratory effort normal. No respiratory distress. No conversational dyspnea.  Cardiovascular system: S1 & S2 heard, Irreg rhythm rate 80s, No murmurs. No pedal edema. Gastrointestinal system: Abdomen is nondistended, soft and nontender. Normal bowel sounds heard. Central nervous system: Alert and oriented. No focal neurological deficits. Speech clear.  Extremities: Symmetric in appearance  Skin: No rashes, lesions or ulcers on exposed skin  Psychiatry: Judgement and insight appear normal. Mood & affect appropriate.   Data Reviewed: I have personally reviewed following labs and imaging studies  CBC: Recent Labs  Lab 03/16/21 1100 03/16/21 1112 03/20/21 1317  WBC 7.6  --  6.9  NEUTROABS 4.6  --   --   HGB 12.1* 11.6*  13.0  HCT 36.2* 34.0* 39.3  MCV 95.5  --  95.6  PLT 182  --  381   Basic Metabolic Panel: Recent Labs  Lab 03/16/21 1100 03/16/21 1112 03/20/21 1317 03/20/21 1650 03/21/21 0118  NA 137 138 141  --  139  K 3.4* 3.4* 3.8  --  3.9  CL 105 104 109  --  104  CO2 19*  --  23  --  25  GLUCOSE 100* 103* 119*  --  104*  BUN 12 11 9   --  11  CREATININE 0.72 0.60* 0.77  --  0.80  CALCIUM 8.9  --  9.5  --  9.4  MG  --   --   --  1.9  --   PHOS  --   --   --  3.2  --    GFR: Estimated Creatinine Clearance: 106.2 mL/min (by C-G formula based on SCr of 0.8 mg/dL). Liver Function Tests: Recent Labs  Lab 03/16/21 1100  AST 31  ALT 31  ALKPHOS 51  BILITOT 1.1  PROT 6.6  ALBUMIN 3.4*   Recent Labs  Lab 03/16/21 1100  LIPASE 22   No results for input(s): AMMONIA in the last 168 hours. Coagulation Profile: Recent Labs  Lab 03/16/21 1100  INR 1.2   Cardiac Enzymes: No results for input(s): CKTOTAL, CKMB, CKMBINDEX, TROPONINI in the last 168 hours. BNP (last 3 results) No results for input(s): PROBNP in the last 8760 hours. HbA1C: No results for input(s): HGBA1C in the last 72 hours. CBG: Recent Labs  Lab 03/20/21 1638 03/20/21 2056 03/21/21 0607  GLUCAP 119* 128* 126*   Lipid Profile: No results for input(s): CHOL, HDL, LDLCALC, TRIG, CHOLHDL, LDLDIRECT in the last 72 hours. Thyroid Function Tests: Recent Labs    03/20/21 1650  TSH 2.034   Anemia Panel: No results for input(s): VITAMINB12, FOLATE, FERRITIN, TIBC, IRON, RETICCTPCT in the last 72 hours. Sepsis Labs: No results for input(s): PROCALCITON, LATICACIDVEN in the last 168 hours.  Recent Results (from the past 240 hour(s))  Resp Panel by RT-PCR (Flu A&B, Covid) Nasopharyngeal Swab     Status: None   Collection Time: 03/16/21 11:25 AM   Specimen: Nasopharyngeal Swab; Nasopharyngeal(NP) swabs in vial transport medium  Result Value Ref Range Status   SARS Coronavirus 2 by RT PCR NEGATIVE NEGATIVE Final     Comment: (NOTE) SARS-CoV-2 target nucleic acids are NOT DETECTED.  The SARS-CoV-2 RNA is generally detectable in upper respiratory specimens during the acute phase of infection. The lowest concentration of SARS-CoV-2 viral copies this assay can detect is 138 copies/mL. A negative result does not preclude SARS-Cov-2 infection and should not be used as the sole basis for treatment or other patient management decisions. A negative result may occur with  improper specimen collection/handling, submission of specimen other than nasopharyngeal swab, presence of viral mutation(s) within the areas targeted by this assay, and inadequate number of viral copies(<138 copies/mL). A negative result must be combined with clinical observations, patient history, and epidemiological information. The expected result is Negative.  Fact Sheet for Patients:  EntrepreneurPulse.com.au  Fact Sheet for Healthcare Providers:  IncredibleEmployment.be  This test is no t yet approved or cleared by the [place] FDA and  has been authorized for detection and/or diagnosis of SARS-CoV-2 by FDA under an Emergency Use Authorization (EUA). This EUA will remain  in effect (meaning this test can be used) for the duration of the COVID-19 declaration under Section 564(b)(1) of the Act, 21 U.S.C.section 360bbb-3(b)(1), unless the authorization is terminated  or revoked sooner.       Influenza A by PCR NEGATIVE NEGATIVE Final   Influenza B by PCR NEGATIVE NEGATIVE Final    Comment: (NOTE) The Xpert Xpress SARS-CoV-2/FLU/RSV plus assay is intended as an aid in the diagnosis of influenza from Nasopharyngeal swab specimens and should not be used as a sole basis for treatment. Nasal washings and aspirates are unacceptable for Xpert Xpress SARS-CoV-2/FLU/RSV testing.  Fact Sheet for Patients: EntrepreneurPulse.com.au  Fact Sheet for Healthcare  Providers: IncredibleEmployment.be  This test is not yet approved or cleared by the [place] FDA and has been authorized for detection  and/or diagnosis of SARS-CoV-2 by FDA under an Emergency Use Authorization (EUA). This EUA will remain in effect (meaning this test can be used) for the duration of the COVID-19 declaration under Section 564(b)(1) of the Act, 21 U.S.C. section 360bbb-3(b)(1), unless the authorization is terminated or revoked.  Performed at [facility], [place] [address] [place], [place] [address]   SARS CORONAVIRUS 2 (TAT 6-24 HRS) Nasopharyngeal Nasopharyngeal Swab     Status: None   Collection Time: 03/20/21  2:55 PM   Specimen: Nasopharyngeal Swab  Result Value Ref Range Status   SARS Coronavirus 2 NEGATIVE NEGATIVE Final    Comment: (NOTE) SARS-CoV-2 target nucleic acids are NOT DETECTED.  The SARS-CoV-2 RNA is generally detectable in upper and lower respiratory specimens during the acute phase of infection. Negative results do not preclude SARS-CoV-2 infection, do not rule out co-infections with other pathogens, and should not be used as the sole basis for treatment or other patient management decisions. Negative results must be combined with clinical observations, patient history, and epidemiological information. The expected result is Negative.  Fact Sheet for Patients: SugarRoll.be  Fact Sheet for Healthcare Providers: https://www.woods-mathews.com/  This test is not yet approved or cleared by the [place] FDA and  has been authorized for detection and/or diagnosis of SARS-CoV-2 by FDA under an Emergency Use Authorization (EUA). This EUA will remain  in effect (meaning this test can be used) for the duration of the COVID-19 declaration under Se ction 564(b)(1) of the Act, 21 U.S.C. section 360bbb-3(b)(1), unless the authorization is terminated or revoked sooner.  Performed at  [facility], [place] [address] [place], [place] [address]       Radiology Studies: DG Chest Port 1 View  Result Date: 03/20/2021 CLINICAL DATA:  Shortness of breath EXAM: PORTABLE CHEST 1 VIEW COMPARISON:  03/16/2021 FINDINGS: Low lung volumes. Improved aeration at lung bases. No pleural effusion. No pneumothorax. Stable cardiomediastinal contours. IMPRESSION: Improved aeration at the lung bases. Electronically Signed   By: Macy Mis M.D.   On: 03/20/2021 14:24      Scheduled Meds:  allopurinol  300 mg Oral Daily   amiodarone  400 mg Oral BID   apixaban  5 mg Oral BID   divalproex  125 mg Oral TID   DULoxetine  60 mg Oral Daily   empagliflozin  25 mg Oral QAC breakfast   umeclidinium bromide  1 puff Inhalation Daily   And   fluticasone furoate-vilanterol  1 puff Inhalation Daily   folic acid  1 mg Oral Daily   gabapentin  600 mg Oral QHS   icosapent Ethyl  2 g Oral BID   insulin aspart  0-15 Units Subcutaneous TID WC   insulin glargine  80 Units Subcutaneous Daily   linagliptin  5 mg Oral Daily   losartan  100 mg Oral Daily   magnesium oxide  400 mg Oral Daily   metFORMIN  500 mg Oral BID WC   metoprolol tartrate  50 mg Oral TID   montelukast  10 mg Oral QHS   multivitamin with minerals  1 tablet Oral Daily   potassium chloride SA  10 mEq Oral Daily   predniSONE  60 mg Oral Q breakfast   thiamine  500 mg Oral Daily   valACYclovir  1,000 mg Oral TID   Continuous Infusions:  diltiazem (CARDIZEM) infusion 15 mg/hr (03/21/21 0650)     LOS: 1 day      Time spent: 25 minutes  Dessa Phi, DO [facility] 03/21/2021, 11:26 AM   Available via Epic secure chat 7am-7pm After these hours, please refer to coverage provider listed on [email or web address]

## 2021-03-21 NOTE — Progress Notes (Signed)
Pt. Place in two point restraints. Pt. Has increase confusing and agitations Pt.is not redirectable. He has tried to get out of bed multiple times and pulling at his lines.  Wife at bedside with pt. Wife unable to  keep to safe in bed. Provider page. New orders place. Will continue to monitor pt. For safety.

## 2021-03-22 DIAGNOSIS — I4819 Other persistent atrial fibrillation: Secondary | ICD-10-CM | POA: Diagnosis not present

## 2021-03-22 DIAGNOSIS — I4891 Unspecified atrial fibrillation: Secondary | ICD-10-CM | POA: Diagnosis not present

## 2021-03-22 DIAGNOSIS — I5033 Acute on chronic diastolic (congestive) heart failure: Secondary | ICD-10-CM | POA: Diagnosis not present

## 2021-03-22 LAB — CBC
HCT: 39.6 % (ref 39.0–52.0)
Hemoglobin: 13.2 g/dL (ref 13.0–17.0)
MCH: 31.6 pg (ref 26.0–34.0)
MCHC: 33.3 g/dL (ref 30.0–36.0)
MCV: 94.7 fL (ref 80.0–100.0)
Platelets: 215 10*3/uL (ref 150–400)
RBC: 4.18 MIL/uL — ABNORMAL LOW (ref 4.22–5.81)
RDW: 14.8 % (ref 11.5–15.5)
WBC: 11.3 10*3/uL — ABNORMAL HIGH (ref 4.0–10.5)
nRBC: 0 % (ref 0.0–0.2)

## 2021-03-22 LAB — BASIC METABOLIC PANEL
Anion gap: 16 — ABNORMAL HIGH (ref 5–15)
BUN: 16 mg/dL (ref 8–23)
CO2: 21 mmol/L — ABNORMAL LOW (ref 22–32)
Calcium: 10.1 mg/dL (ref 8.9–10.3)
Chloride: 100 mmol/L (ref 98–111)
Creatinine, Ser: 0.86 mg/dL (ref 0.61–1.24)
GFR, Estimated: >60 mL/min (ref >60)
Glucose, Bld: 105 mg/dL — ABNORMAL HIGH (ref 70–99)
Potassium: 3.4 mmol/L — ABNORMAL LOW (ref 3.5–5.1)
Sodium: 137 mmol/L (ref 135–145)

## 2021-03-22 LAB — GLUCOSE, CAPILLARY
Glucose-Capillary: 105 mg/dL — ABNORMAL HIGH (ref 70–99)
Glucose-Capillary: 151 mg/dL — ABNORMAL HIGH (ref 70–99)
Glucose-Capillary: 159 mg/dL — ABNORMAL HIGH (ref 70–99)
Glucose-Capillary: 170 mg/dL — ABNORMAL HIGH (ref 70–99)

## 2021-03-22 MED ORDER — POTASSIUM CHLORIDE CRYS ER 20 MEQ PO TBCR
40.0000 meq | EXTENDED_RELEASE_TABLET | Freq: Once | ORAL | Status: AC
  Administered 2021-03-22: 40 meq via ORAL
  Filled 2021-03-22: qty 2

## 2021-03-22 NOTE — H&P (View-Only) (Signed)
Patient Name: Adam [name]      Patient Profile:     Adam [name] is a 70 y.o. male with a hx of CAD (CABG 2011), HTN, HLD, DM, OSA, ETOH abuse (hospitalized 10/24/19 for GI bleeding and alcohol withdrawal  was discharged to rehab for a prolonged period of time for Wernicke's encephalopathy), continues to drink , COPD een 03/20/2021 for the evaluation of recurrent AFlutter w/RVR; hx of aFib ablation ([facility]) 2019  Echo 5/21 EF 60-65% Dofetilide discontinued and amiodarone started with concerns re anticoagulation compliance prompting plan TEEDCCV    SUBJECTIVE:less sob less confused  remembers about his procedure tomorrow  Discussed the "surprising to me" observation that he was wearing a DNR bracelet   Respiratory situation improved    Past Medical History:  Diagnosis Date   Allergy    Anxiety    history of PTSD following CABG   Ascending aortic aneurysm ([place]) 01/31/2018   43 x 42 mm, pt unaware   Asthma    Cardiomegaly 10/17/2017   Colitis- colonoscopy 2014 07/13/2015   COPD (chronic obstructive pulmonary disease) ([place])    Coronary artery disease    x 6   Depression    Diabetes mellitus without complication ([place])    Family history of polyps in the colon    Finger dislocation    Left pinkie   GERD (gastroesophageal reflux disease)    Gout    H/O atrial fibrillation without current medication    following CABG with no documented episodes since then.   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    Scheduled Meds:  Scheduled Meds:  allopurinol  300 mg Oral Daily   amiodarone  400 mg Oral BID   apixaban  5 mg Oral BID   divalproex  125 mg Oral TID   DULoxetine  60 mg Oral Daily   empagliflozin  25 mg Oral QAC  breakfast   umeclidinium bromide  1 puff Inhalation Daily   And   fluticasone furoate-vilanterol  1 puff Inhalation Daily   folic acid  1 mg Oral Daily   gabapentin  600 mg Oral QHS   icosapent Ethyl  2 g Oral BID   insulin aspart  0-15 Units Subcutaneous TID WC   insulin glargine  80 Units Subcutaneous Daily   linagliptin  5 mg Oral Daily   magnesium oxide  400 mg Oral Daily   metoprolol tartrate  50 mg Oral TID   montelukast  10 mg Oral QHS   multivitamin with minerals  1 tablet Oral Daily   predniSONE  60 mg Oral Q breakfast   thiamine  500 mg Oral Daily   valACYclovir  1,000 mg Oral TID   Continuous Infusions:  diltiazem (CARDIZEM) infusion 12.5 mg/hr (03/22/21 1441)   acetaminophen, guaiFENesin-dextromethorphan, hydrALAZINE, LORazepam **OR** LORazepam, ondansetron (ZOFRAN) IV    PHYSICAL EXAM Vitals:   03/22/21 0730 03/22/21 1000 03/22/21 1132 03/22/21 1430  BP:   116/90 113/81  Pulse:    (!) 138  Resp:   20 17  Temp:  98.7 F (37.1 C)    TempSrc:  Oral Oral   SpO2: 98%  95%  96%  Weight:      Height:         Well developed and nourished in no acute distress HENT normal Neck supple with JVP-flat Carotids brisk and full without bruits Clear Irregularly irregular rate and rhythm with rapid ventricular response, no murmurs or gallops Abd-soft with active BS without hepatomegaly No Clubbing cyanosis edema Skin-warm and dry A & Oriented  Grossly normal sensory and motor function    TELEMETRy Personally reviewed  afib HR 100-150      Intake/Output Summary (Last 24 hours) at 03/22/2021 1503 Last data filed at 03/22/2021 1320 Gross per 24 hour  Intake 802.99 ml  Output 700 ml  Net 102.99 ml     LABS: Basic Metabolic Panel: Recent Labs  Lab 03/16/21 1100 03/16/21 1112 03/20/21 1317 03/20/21 1650 03/21/21 0118 03/22/21 0329  NA 137 138 141  --  139 137  K 3.4* 3.4* 3.8  --  3.9 3.4*  CL 105 104 109  --  104 100  CO2 19*  --  23  --  25 21*   GLUCOSE 100* 103* 119*  --  104* 105*  BUN 12 11 9   --  11 16  CREATININE 0.72 0.60* 0.77  --  0.80 0.86  CALCIUM 8.9  --  9.5  --  9.4 10.1  MG  --   --   --  1.9  --   --   PHOS  --   --   --  3.2  --   --     Cardiac Enzymes: No results for input(s): CKTOTAL, CKMB, CKMBINDEX, TROPONINI in the last 72 hours. CBC: Recent Labs  Lab 03/16/21 1100 03/16/21 1112 03/20/21 1317 03/22/21 0329  WBC 7.6  --  6.9 11.3*  NEUTROABS 4.6  --   --   --   HGB 12.1* 11.6* 13.0 13.2  HCT 36.2* 34.0* 39.3 39.6  MCV 95.5  --  95.6 94.7  PLT 182  --  184 215     BNP: BNP (last 3 results) Recent Labs    03/20/21 1317  BNP 286.3*     ProBNP (last 3 results) No results for input(s): PROBNP in the last 8760 hours.  D-Dimer: No results for input(s): DDIMER in the last 72 hours. Hemoglobin A1C: No results for input(s): HGBA1C in the last 72 hours. Fasting Lipid Panel: No results for input(s): CHOL, HDL, LDLCALC, TRIG, CHOLHDL, LDLDIRECT in the last 72 hours. Thyroid Function Tests: Recent Labs    03/20/21 1650  TSH 2.034       ASSESSMENT AND PLAN:  ATrial fib/flutter with RVR  Prior PVI, prev dofetilide now being started on amio   EtOH abuse  HFpEF  HTN    CAD with prior CABG  OSA  Wernickes encephalopathy  COPD    Hypokalemia   Continue amio and dilt His heart rate will be difficult to control given alcohol withdrawal.  He is functionally stable so would not pursue it too hard I would continue the diltiazem for now.  BP better   For TEE DCCV in am and orders written   Discussion regarding DNR.  Reviewed with him the implications of DNR for an acute event and the distinction between DNR and prolonged supportive care.  He recalls having been resuscitated 20 years ago and would like to have the opportunity for resuscitation again.  I have advised him and his wife to discuss the limits of supportive care i.e. how much time on a  ventilator, how long for antibiotics  without improvement, and that the DNR status can always be resumed if clinical situation has deteriorated.  He would like to REVERSE HIS DNR   Orders written to reverse            Signed, Virl Axe MD  03/22/2021

## 2021-03-22 NOTE — Progress Notes (Signed)
PROGRESS NOTE    Adam [name]  NIO:[id number] DOB: 1951/09/10 DOA: 03/20/2021 PCP: [name], MD     Brief Narrative:  Adam [name] is a 70 y.o. male with medical history significant of chronic A. fib on Tikosyn and Eliquis, CAD status post stenting, IDDM, HTN, HLD, came with palpitations.   Patient has not been able to take Cardizem for last 3 days due to insurance issue.  And this morning he started to have palpitations and was found heart rate in the 150s.  Denies any chest pain, no shortness of breath.  He is a chronic alcohol drinker, last drink was this morning with a half a glass of wine.   Three days ago, patient developed acute onset of left-sided facial droop, and drooling and slurred speech, went to see PCP who ordered a brain MRI which showed negative for acute stroke.  Denies any weakness or numbness of any of the limbs, no hearing changes no blurry vision.   ED Course: Heart rate in the 140s, EKG showed uncontrolled A. fib, blood pressure stable, no hypoxia chest x-ray clean.  Cardiology consulted, recommend Cardizem drip and admission.  Patient is scheduled for cardioversion on Monday 6/20.  Hospitalization complicated by alcohol withdrawal. He required IV ativan, sitter at bedside, wrist restraints.   New events last 24 hours / Subjective: Off wrist restraints this morning.  He is eating breakfast.  Seems to be much more calm and less agitated although slightly tremorous. CIWA score up to 12 overnight  Assessment & Plan:   Principal Problem:   Atrial fibrillation with rapid ventricular response (HCC) Active Problems:   CAD (coronary artery disease)   Hyperlipidemia associated with type 2 diabetes mellitus (HCC)   Alcohol abuse   Benign essential hypertension   A. fib RVR -Remains on Cardizem drip, Eliquis, amiodarone -Cardiology following -Plan for cardioversion on Monday  [name] -Started on steroid, valacyclovir  Hypertension -Continue  losartan, metoprolol  Alcohol abuse -CIWA protocol for withdrawal  Insulin-dependent diabetes -Continue Lantus, sliding scale insulin, Jardiance, Tradjenta  Mood disorder -Continue Depakote, Cymbalta  Hypokalemia -Replace, trend  Obesity -Estimated body mass index is 33.63 kg/m as calculated from the following:   Height as of this encounter: 5\' 10"  (1.778 m).   Weight as of this encounter: 106.3 kg.   DVT prophylaxis:   apixaban (ELIQUIS) tablet 5 mg  Code Status:     Code Status Orders  (From admission, onward)           Start     Ordered   03/20/21 1546  Do not attempt resuscitation (DNR)  Continuous       Question Answer Comment  In the event of cardiac or respiratory ARREST Do not call a "code blue"   In the event of cardiac or respiratory ARREST Do not perform Intubation, CPR, defibrillation or ACLS   In the event of cardiac or respiratory ARREST Use medication by any route, position, wound care, and other measures to relive pain and suffering. May use oxygen, suction and manual treatment of airway obstruction as needed for comfort.      03/20/21 1545           Code Status History     Date Active Date Inactive Code Status Order ID Comments User Context   02/25/2020 0739 03/03/2020 1927 DNR [id number]  Annamaria Helling Inpatient   02/20/2020 1922 02/25/2020 0739 DNR [id number]  Elwyn Reach, MD ED   02/14/2020 [phone] 02/18/2020  2259 DNR [id number]  Domenic Polite, MD Inpatient   02/08/2020 2246 02/14/2020 0839 Full Code [id number]  [name], MD ED   10/25/2019 0015 10/27/2019 2015 Full Code [id number]  [name], MD ED   09/12/2018 1829 09/13/2018 1905 Full Code [id number]  [name] Inpatient   03/31/2018 1630 04/01/2018 0015 Full Code [id number]  [name], MD Inpatient   12/12/2017 1624 12/18/2017 1801 Full Code [id number]  [name], MD Inpatient   12/12/2017 1622 12/12/2017 1624 Full Code [id number]  [name], MD Inpatient      Family Communication: None at bedside Disposition Plan:  Status is: Inpatient  Remains inpatient appropriate because:Hemodynamically unstable, Ongoing diagnostic testing needed not appropriate for outpatient work up, IV treatments appropriate due to intensity of illness or inability to take PO, and Inpatient level of care appropriate due to severity of illness  Dispo: The patient is from: Home              Anticipated d/c is to: Home              Patient currently is not medically stable to d/c.   Difficult to place patient No      Consultants:  Cardiology   Procedures:  None  Antimicrobials:  Anti-infectives (From admission, onward)    Start     Dose/Rate Route Frequency Ordered Stop   03/20/21 1600  valACYclovir (VALTREX) tablet 1,000 mg        1,000 mg Oral 3 times daily 03/20/21 1546          Objective: Vitals:   03/21/21 2301 03/22/21 0500 03/22/21 0730 03/22/21 1000  BP: 126/77 121/66    Pulse: 100 64    Resp: (!) 21 (!) 24    Temp: 99.5 F (37.5 C) 98.6 F (37 C)  98.7 F (37.1 C)  TempSrc: Axillary Axillary  Oral  SpO2: 96% 96% 98%   Weight:  106.3 kg    Height:        Intake/Output Summary (Last 24 hours) at 03/22/2021 1101 Last data filed at 03/22/2021 1004 Gross per 24 hour  Intake 622.99 ml  Output 450 ml  Net 172.99 ml    Filed Weights   03/20/21 1955 03/21/21 0033 03/22/21 0500  Weight: 111.4 kg 108.9 kg 106.3 kg   Examination: General exam: Appears calm and comfortable  Respiratory system: Clear to auscultation. Respiratory effort normal. Cardiovascular system: S1 & S2 heard, irregular rhythm. No pedal edema. Gastrointestinal system: Abdomen is nondistended, soft and nontender. Normal bowel sounds heard. Central nervous system: Alert and oriented. Non focal exam. Speech clear.  Slightly tremorous Extremities: Symmetric in appearance bilaterally  Skin: No rashes, lesions or ulcers on exposed skin  Psychiatry:  Mood & affect appropriate.    Data Reviewed: I have personally reviewed following labs and imaging studies  CBC: Recent Labs  Lab 03/16/21 1100 03/16/21 1112 03/20/21 1317 03/22/21 0329  WBC 7.6  --  6.9 11.3*  NEUTROABS 4.6  --   --   --   HGB 12.1* 11.6* 13.0 13.2  HCT 36.2* 34.0* 39.3 39.6  MCV 95.5  --  95.6 94.7  PLT 182  --  184 801    Basic Metabolic Panel: Recent Labs  Lab 03/16/21 1100 03/16/21 1112 03/20/21 1317 03/20/21 1650 03/21/21 0118 03/22/21 0329  NA 137 138 141  --  139 137  K 3.4* 3.4* 3.8  --  3.9 3.4*  CL 105 104 109  --  104 100  CO2 19*  --  23  --  25 21*  GLUCOSE 100* 103* 119*  --  104* 105*  BUN 12 11 9   --  11 16  CREATININE 0.72 0.60* 0.77  --  0.80 0.86  CALCIUM 8.9  --  9.5  --  9.4 10.1  MG  --   --   --  1.9  --   --   PHOS  --   --   --  3.2  --   --     GFR: Estimated Creatinine Clearance: 97.6 mL/min (by C-G formula based on SCr of 0.86 mg/dL). Liver Function Tests: Recent Labs  Lab 03/16/21 1100  AST 31  ALT 31  ALKPHOS 51  BILITOT 1.1  PROT 6.6  ALBUMIN 3.4*    Recent Labs  Lab 03/16/21 1100  LIPASE 22    No results for input(s): AMMONIA in the last 168 hours. Coagulation Profile: Recent Labs  Lab 03/16/21 1100  INR 1.2    Cardiac Enzymes: No results for input(s): CKTOTAL, CKMB, CKMBINDEX, TROPONINI in the last 168 hours. BNP (last 3 results) No results for input(s): PROBNP in the last 8760 hours. HbA1C: No results for input(s): HGBA1C in the last 72 hours. CBG: Recent Labs  Lab 03/21/21 1159 03/21/21 1608 03/21/21 2106 03/22/21 0601 03/22/21 1041  GLUCAP 163* 149* 152* 105* 151*    Lipid Profile: No results for input(s): CHOL, HDL, LDLCALC, TRIG, CHOLHDL, LDLDIRECT in the last 72 hours. Thyroid Function Tests: Recent Labs    03/20/21 1650  TSH 2.034    Anemia Panel: No results for input(s): VITAMINB12, FOLATE, FERRITIN, TIBC, IRON, RETICCTPCT in the last 72 hours. Sepsis Labs: No  results for input(s): PROCALCITON, LATICACIDVEN in the last 168 hours.  Recent Results (from the past 240 hour(s))  Resp Panel by RT-PCR (Flu A&B, Covid) Nasopharyngeal Swab     Status: None   Collection Time: 03/16/21 11:25 AM   Specimen: Nasopharyngeal Swab; Nasopharyngeal(NP) swabs in vial transport medium  Result Value Ref Range Status   SARS Coronavirus 2 by RT PCR NEGATIVE NEGATIVE Final    Comment: (NOTE) SARS-CoV-2 target nucleic acids are NOT DETECTED.  The SARS-CoV-2 RNA is generally detectable in upper respiratory specimens during the acute phase of infection. The lowest concentration of SARS-CoV-2 viral copies this assay can detect is 138 copies/mL. A negative result does not preclude SARS-Cov-2 infection and should not be used as the sole basis for treatment or other patient management decisions. A negative result may occur with  improper specimen collection/handling, submission of specimen other than nasopharyngeal swab, presence of viral mutation(s) within the areas targeted by this assay, and inadequate number of viral copies(<138 copies/mL). A negative result must be combined with clinical observations, patient history, and epidemiological information. The expected result is Negative.  Fact Sheet for Patients:  EntrepreneurPulse.com.au  Fact Sheet for Healthcare Providers:  IncredibleEmployment.be  This test is no t yet approved or cleared by the [place] FDA and  has been authorized for detection and/or diagnosis of SARS-CoV-2 by FDA under an Emergency Use Authorization (EUA). This EUA will remain  in effect (meaning this test can be used) for the duration of the COVID-19 declaration under Section 564(b)(1) of the Act, 21 U.S.C.section 360bbb-3(b)(1), unless the authorization is terminated  or revoked sooner.       Influenza A by PCR NEGATIVE NEGATIVE Final   Influenza B by PCR NEGATIVE NEGATIVE Final    Comment:  (NOTE)  The Xpert Xpress SARS-CoV-2/FLU/RSV plus assay is intended as an aid in the diagnosis of influenza from Nasopharyngeal swab specimens and should not be used as a sole basis for treatment. Nasal washings and aspirates are unacceptable for Xpert Xpress SARS-CoV-2/FLU/RSV testing.  Fact Sheet for Patients: EntrepreneurPulse.com.au  Fact Sheet for Healthcare Providers: IncredibleEmployment.be  This test is not yet approved or cleared by the [place] FDA and has been authorized for detection and/or diagnosis of SARS-CoV-2 by FDA under an Emergency Use Authorization (EUA). This EUA will remain in effect (meaning this test can be used) for the duration of the COVID-19 declaration under Section 564(b)(1) of the Act, 21 U.S.C. section 360bbb-3(b)(1), unless the authorization is terminated or revoked.  Performed at [facility], [place] [address], [place], [place] [address]   SARS CORONAVIRUS 2 (TAT 6-24 HRS) Nasopharyngeal Nasopharyngeal Swab     Status: None   Collection Time: 03/20/21  2:55 PM   Specimen: Nasopharyngeal Swab  Result Value Ref Range Status   SARS Coronavirus 2 NEGATIVE NEGATIVE Final    Comment: (NOTE) SARS-CoV-2 target nucleic acids are NOT DETECTED.  The SARS-CoV-2 RNA is generally detectable in upper and lower respiratory specimens during the acute phase of infection. Negative results do not preclude SARS-CoV-2 infection, do not rule out co-infections with other pathogens, and should not be used as the sole basis for treatment or other patient management decisions. Negative results must be combined with clinical observations, patient history, and epidemiological information. The expected result is Negative.  Fact Sheet for Patients: SugarRoll.be  Fact Sheet for Healthcare Providers: https://www.woods-mathews.com/  This test is not yet approved or cleared by the [place] FDA and  has been authorized for detection and/or diagnosis of SARS-CoV-2 by FDA under an Emergency Use Authorization (EUA). This EUA will remain  in effect (meaning this test can be used) for the duration of the COVID-19 declaration under Se ction 564(b)(1) of the Act, 21 U.S.C. section 360bbb-3(b)(1), unless the authorization is terminated or revoked sooner.  Performed at [facility], [place] [address] [place], [place] [address]        Radiology Studies: DG Chest Port 1 View  Result Date: 03/20/2021 CLINICAL DATA:  Shortness of breath EXAM: PORTABLE CHEST 1 VIEW COMPARISON:  03/16/2021 FINDINGS: Low lung volumes. Improved aeration at lung bases. No pleural effusion. No pneumothorax. Stable cardiomediastinal contours. IMPRESSION: Improved aeration at the lung bases. Electronically Signed   By: Macy Mis M.D.   On: 03/20/2021 14:24      Scheduled Meds:  allopurinol  300 mg Oral Daily   amiodarone  400 mg Oral BID   apixaban  5 mg Oral BID   divalproex  125 mg Oral TID   DULoxetine  60 mg Oral Daily   empagliflozin  25 mg Oral QAC breakfast   umeclidinium bromide  1 puff Inhalation Daily   And   fluticasone furoate-vilanterol  1 puff Inhalation Daily   folic acid  1 mg Oral Daily   gabapentin  600 mg Oral QHS   icosapent Ethyl  2 g Oral BID   insulin aspart  0-15 Units Subcutaneous TID WC   insulin glargine  80 Units Subcutaneous Daily   linagliptin  5 mg Oral Daily   losartan  100 mg Oral Daily   magnesium oxide  400 mg Oral Daily   metoprolol tartrate  50 mg Oral TID   montelukast  10 mg Oral QHS   multivitamin with minerals  1 tablet Oral Daily   predniSONE  60 mg Oral Q breakfast   thiamine  500 mg Oral Daily   valACYclovir  1,000 mg Oral TID   Continuous Infusions:  diltiazem (CARDIZEM) infusion 10 mg/hr (03/22/21 0314)     LOS: 2 days      Time spent: 20 minutes   Dessa Phi, DO [facility] 03/22/2021, 11:01 AM   Available  via Epic secure chat 7am-7pm After these hours, please refer to coverage provider listed on [email or web address]

## 2021-03-22 NOTE — Progress Notes (Signed)
Restraints have been removed. Pt. Is alert and oriented to self, place and situation. He has been following commands and is up eating in bed with staff supervision. Provider notified face to face during rounds.

## 2021-03-22 NOTE — Progress Notes (Signed)
Nurse noted pt. HR sustaining at 130-137s. Nurse got a set of vitals, replaced leads, and assess pt.  For PRNs. HR remained increase. Nurse increase Caredizem per orders. Pt. Was reassess and remain asymptomatic.. This is not a change from pt. Baseline. Nurse update provider, no new orders place. Will continue to monitor.

## 2021-03-22 NOTE — Progress Notes (Addendum)
   03/21/21 0900  Assess: MEWS Score  Temp (!) 96.5 F (35.8 C)  BP (!) 142/77  ECG Heart Rate 93  Resp (!) 30  Level of Consciousness Alert  SpO2 98 %  O2 Device Room Air  Assess: MEWS Score  MEWS Temp 1  MEWS Systolic 0  MEWS Pulse 0  MEWS RR 2  MEWS LOC 0  MEWS Score 3  MEWS Score Color Yellow  Assess: if the MEWS score is Yellow or Red  Were vital signs taken at a resting state? Yes  Focused Assessment Change from prior assessment (see assessment flowsheet)  Early Detection of Sepsis Score *See Row Information* Medium  MEWS guidelines implemented *See Row Information* No, vital signs rechecked  Treat  MEWS Interventions Administered scheduled meds/treatments;Administered prn meds/treatments  Pain Scale 0-10  Pain Score 0  Patients response to intervention Effective  Take Vital Signs  Increase Vital Sign Frequency  Yellow: Q 2hr X 2 then Q 4hr X 2, if remains yellow, continue Q 4hrs  Escalate  MEWS: Escalate Yellow: discuss with charge nurse/RN and consider discussing with provider and RRT  Notify: Charge Nurse/RN  Name of Charge Nurse/RN Notified Velia, RN  Date Charge Nurse/RN Notified 03/21/21  Time Charge Nurse/RN Notified 0900  Notify: Provider  Provider Name/Title Choi  Date Provider Notified 03/22/21  Time Provider Notified 0900  Notification Type Face-to-face  Notification Reason Change in status  Provider response At bedside  Date of Provider Response 03/22/21  Time of Provider Response 0900  Notify: Rapid Response  Name of Rapid Response RN Notified N/A  Document  Patient Outcome Stabilized after interventions  Progress note created (see row info) Yes  Pt. Was given aitvan per CIWA, and scheduled meds.

## 2021-03-22 NOTE — Progress Notes (Signed)
Pt. Wife at bedside.  Procedure scheduled for tomorrow reviewed with pt. And wife. Consent completed an place in pt. Chart.

## 2021-03-22 NOTE — Progress Notes (Signed)
Patient Name: Adam [name]      Patient Profile:     Adam [name] is a 70 y.o. male with a hx of CAD (CABG 2011), HTN, HLD, DM, OSA, ETOH abuse (hospitalized 10/24/19 for GI bleeding and alcohol withdrawal  was discharged to rehab for a prolonged period of time for Wernicke's encephalopathy), continues to drink , COPD een 03/20/2021 for the evaluation of recurrent AFlutter w/RVR; hx of aFib ablation ([facility]) 2019  Echo 5/21 EF 60-65% Dofetilide discontinued and amiodarone started with concerns re anticoagulation compliance prompting plan TEEDCCV    SUBJECTIVE:less sob less confused  remembers about his procedure tomorrow  Discussed the "surprising to me" observation that he was wearing a DNR bracelet   Respiratory situation improved    Past Medical History:  Diagnosis Date   Allergy    Anxiety    history of PTSD following CABG   Ascending aortic aneurysm ([place]) 01/31/2018   43 x 42 mm, pt unaware   Asthma    Cardiomegaly 10/17/2017   Colitis- colonoscopy 2014 07/13/2015   COPD (chronic obstructive pulmonary disease) ([place])    Coronary artery disease    x 6   Depression    Diabetes mellitus without complication ([place])    Family history of polyps in the colon    Finger dislocation    Left pinkie   GERD (gastroesophageal reflux disease)    Gout    H/O atrial fibrillation without current medication    following CABG with no documented episodes since then.   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    Scheduled Meds:  Scheduled Meds:  allopurinol  300 mg Oral Daily   amiodarone  400 mg Oral BID   apixaban  5 mg Oral BID   divalproex  125 mg Oral TID   DULoxetine  60 mg Oral Daily   empagliflozin  25 mg Oral QAC  breakfast   umeclidinium bromide  1 puff Inhalation Daily   And   fluticasone furoate-vilanterol  1 puff Inhalation Daily   folic acid  1 mg Oral Daily   gabapentin  600 mg Oral QHS   icosapent Ethyl  2 g Oral BID   insulin aspart  0-15 Units Subcutaneous TID WC   insulin glargine  80 Units Subcutaneous Daily   linagliptin  5 mg Oral Daily   magnesium oxide  400 mg Oral Daily   metoprolol tartrate  50 mg Oral TID   montelukast  10 mg Oral QHS   multivitamin with minerals  1 tablet Oral Daily   predniSONE  60 mg Oral Q breakfast   thiamine  500 mg Oral Daily   valACYclovir  1,000 mg Oral TID   Continuous Infusions:  diltiazem (CARDIZEM) infusion 12.5 mg/hr (03/22/21 1441)   acetaminophen, guaiFENesin-dextromethorphan, hydrALAZINE, LORazepam **OR** LORazepam, ondansetron (ZOFRAN) IV    PHYSICAL EXAM Vitals:   03/22/21 0730 03/22/21 1000 03/22/21 1132 03/22/21 1430  BP:   116/90 113/81  Pulse:    (!) 138  Resp:   20 17  Temp:  98.7 F (37.1 C)    TempSrc:  Oral Oral   SpO2: 98%  95%  96%  Weight:      Height:         Well developed and nourished in no acute distress HENT normal Neck supple with JVP-flat Carotids brisk and full without bruits Clear Irregularly irregular rate and rhythm with rapid ventricular response, no murmurs or gallops Abd-soft with active BS without hepatomegaly No Clubbing cyanosis edema Skin-warm and dry A & Oriented  Grossly normal sensory and motor function    TELEMETRy Personally reviewed  afib HR 100-150      Intake/Output Summary (Last 24 hours) at 03/22/2021 1503 Last data filed at 03/22/2021 1320 Gross per 24 hour  Intake 802.99 ml  Output 700 ml  Net 102.99 ml     LABS: Basic Metabolic Panel: Recent Labs  Lab 03/16/21 1100 03/16/21 1112 03/20/21 1317 03/20/21 1650 03/21/21 0118 03/22/21 0329  NA 137 138 141  --  139 137  K 3.4* 3.4* 3.8  --  3.9 3.4*  CL 105 104 109  --  104 100  CO2 19*  --  23  --  25 21*   GLUCOSE 100* 103* 119*  --  104* 105*  BUN 12 11 9   --  11 16  CREATININE 0.72 0.60* 0.77  --  0.80 0.86  CALCIUM 8.9  --  9.5  --  9.4 10.1  MG  --   --   --  1.9  --   --   PHOS  --   --   --  3.2  --   --     Cardiac Enzymes: No results for input(s): CKTOTAL, CKMB, CKMBINDEX, TROPONINI in the last 72 hours. CBC: Recent Labs  Lab 03/16/21 1100 03/16/21 1112 03/20/21 1317 03/22/21 0329  WBC 7.6  --  6.9 11.3*  NEUTROABS 4.6  --   --   --   HGB 12.1* 11.6* 13.0 13.2  HCT 36.2* 34.0* 39.3 39.6  MCV 95.5  --  95.6 94.7  PLT 182  --  184 215     BNP: BNP (last 3 results) Recent Labs    03/20/21 1317  BNP 286.3*     ProBNP (last 3 results) No results for input(s): PROBNP in the last 8760 hours.  D-Dimer: No results for input(s): DDIMER in the last 72 hours. Hemoglobin A1C: No results for input(s): HGBA1C in the last 72 hours. Fasting Lipid Panel: No results for input(s): CHOL, HDL, LDLCALC, TRIG, CHOLHDL, LDLDIRECT in the last 72 hours. Thyroid Function Tests: Recent Labs    03/20/21 1650  TSH 2.034       ASSESSMENT AND PLAN:  ATrial fib/flutter with RVR  Prior PVI, prev dofetilide now being started on amio   EtOH abuse  HFpEF  HTN    CAD with prior CABG  OSA  Wernickes encephalopathy  COPD    Hypokalemia   Continue amio and dilt His heart rate will be difficult to control given alcohol withdrawal.  He is functionally stable so would not pursue it too hard I would continue the diltiazem for now.  BP better   For TEE DCCV in am and orders written   Discussion regarding DNR.  Reviewed with him the implications of DNR for an acute event and the distinction between DNR and prolonged supportive care.  He recalls having been resuscitated 20 years ago and would like to have the opportunity for resuscitation again.  I have advised him and his wife to discuss the limits of supportive care i.e. how much time on a  ventilator, how long for antibiotics  without improvement, and that the DNR status can always be resumed if clinical situation has deteriorated.  He would like to REVERSE HIS DNR   Orders written to reverse            Signed, Virl Axe MD  03/22/2021

## 2021-03-23 ENCOUNTER — Inpatient Hospital Stay (HOSPITAL_COMMUNITY): Payer: Medicare Other | Admitting: Certified Registered Nurse Anesthetist

## 2021-03-23 ENCOUNTER — Encounter (HOSPITAL_COMMUNITY): Payer: Self-pay | Admitting: Internal Medicine

## 2021-03-23 ENCOUNTER — Encounter (HOSPITAL_COMMUNITY)
Admission: EM | Disposition: A | Discharge status: Home / Self Care | Payer: Self-pay | Source: Ambulatory Visit | Attending: Internal Medicine

## 2021-03-23 ENCOUNTER — Inpatient Hospital Stay (HOSPITAL_COMMUNITY): Payer: Medicare Other

## 2021-03-23 DIAGNOSIS — I4891 Unspecified atrial fibrillation: Secondary | ICD-10-CM

## 2021-03-23 DIAGNOSIS — I340 Nonrheumatic mitral (valve) insufficiency: Secondary | ICD-10-CM | POA: Diagnosis not present

## 2021-03-23 HISTORY — PX: CARDIOVERSION: SHX1299

## 2021-03-23 HISTORY — PX: TEE WITHOUT CARDIOVERSION: SHX5443

## 2021-03-23 LAB — BASIC METABOLIC PANEL
Anion gap: 8 (ref 5–15)
BUN: 23 mg/dL (ref 8–23)
CO2: 25 mmol/L (ref 22–32)
Calcium: 9.7 mg/dL (ref 8.9–10.3)
Chloride: 106 mmol/L (ref 98–111)
Creatinine, Ser: 0.98 mg/dL (ref 0.61–1.24)
GFR, Estimated: >60 mL/min (ref >60)
Glucose, Bld: 120 mg/dL — ABNORMAL HIGH (ref 70–99)
Potassium: 3.5 mmol/L (ref 3.5–5.1)
Sodium: 139 mmol/L (ref 135–145)

## 2021-03-23 LAB — HEMOGLOBIN A1C
Hgb A1c MFr Bld: 5.7 % — ABNORMAL HIGH (ref 4.8–5.6)
Mean Plasma Glucose: 117 mg/dL

## 2021-03-23 LAB — GLUCOSE, CAPILLARY
Glucose-Capillary: 132 mg/dL — ABNORMAL HIGH (ref 70–99)
Glucose-Capillary: 124 mg/dL — ABNORMAL HIGH (ref 70–99)
Glucose-Capillary: 182 mg/dL — ABNORMAL HIGH (ref 70–99)
Glucose-Capillary: 183 mg/dL — ABNORMAL HIGH (ref 70–99)

## 2021-03-23 LAB — MAGNESIUM: Magnesium: 2.4 mg/dL (ref 1.7–2.4)

## 2021-03-23 LAB — CBC
HCT: 40.4 % (ref 39.0–52.0)
Hemoglobin: 13.3 g/dL (ref 13.0–17.0)
MCH: 31.5 pg (ref 26.0–34.0)
MCHC: 32.9 g/dL (ref 30.0–36.0)
MCV: 95.7 fL (ref 80.0–100.0)
Platelets: 229 10*3/uL (ref 150–400)
RBC: 4.22 MIL/uL (ref 4.22–5.81)
RDW: 14.9 % (ref 11.5–15.5)
WBC: 12.7 10*3/uL — ABNORMAL HIGH (ref 4.0–10.5)
nRBC: 0 % (ref 0.0–0.2)

## 2021-03-23 LAB — PROTIME-INR
INR: 1.2 (ref 0.8–1.2)
Prothrombin Time: 15.6 seconds — ABNORMAL HIGH (ref 11.4–15.2)

## 2021-03-23 SURGERY — ECHOCARDIOGRAM, TRANSESOPHAGEAL
Anesthesia: Monitor Anesthesia Care

## 2021-03-23 SURGERY — Surgical Case
Anesthesia: *Unknown

## 2021-03-23 MED ORDER — SODIUM CHLORIDE 0.9 % IV SOLN: INTRAVENOUS | Status: DC

## 2021-03-23 MED ORDER — LORAZEPAM 2 MG/ML IJ SOLN
1.0000 mg | INTRAMUSCULAR | Status: DC | PRN
  Administered 2021-03-23 – 2021-03-24 (×2): 1 mg via INTRAVENOUS
  Filled 2021-03-23: qty 1

## 2021-03-23 MED ORDER — LACTATED RINGERS IV SOLN: INTRAVENOUS | Status: DC | PRN

## 2021-03-23 MED ORDER — LIDOCAINE 2% (20 MG/ML) 5 ML SYRINGE
INTRAMUSCULAR | Status: DC | PRN
  Administered 2021-03-23: 100 mg via INTRAVENOUS

## 2021-03-23 MED ORDER — POTASSIUM CHLORIDE CRYS ER 20 MEQ PO TBCR
40.0000 meq | EXTENDED_RELEASE_TABLET | ORAL | Status: AC
  Administered 2021-03-23 (×2): 40 meq via ORAL
  Filled 2021-03-23 (×2): qty 2

## 2021-03-23 MED ORDER — DEXMEDETOMIDINE (PRECEDEX) IN NS 20 MCG/5ML (4 MCG/ML) IV SYRINGE
PREFILLED_SYRINGE | INTRAVENOUS | Status: DC | PRN
  Administered 2021-03-23: 12 ug via INTRAVENOUS
  Administered 2021-03-23: 8 ug via INTRAVENOUS

## 2021-03-23 MED ORDER — PROPOFOL 10 MG/ML IV BOLUS
INTRAVENOUS | Status: DC | PRN
  Administered 2021-03-23 (×2): 20 mg via INTRAVENOUS

## 2021-03-23 MED ORDER — PROPOFOL 500 MG/50ML IV EMUL
INTRAVENOUS | Status: DC | PRN
  Administered 2021-03-23: 125 ug/kg/min via INTRAVENOUS

## 2021-03-23 NOTE — Progress Notes (Signed)
Echocardiogram Echocardiogram Transesophageal has been performed.  Oneal Deputy Jamya Starry 03/23/2021, 8:24 AM

## 2021-03-23 NOTE — Transfer of Care (Signed)
Immediate Anesthesia Transfer of Care Note  Patient: Adam [name]  Procedure(s) Performed: TRANSESOPHAGEAL ECHOCARDIOGRAM (TEE) CARDIOVERSION  Patient Location: PACU and Endoscopy Unit  Anesthesia Type:General  Level of Consciousness: drowsy, patient cooperative and responds to stimulation  Airway & Oxygen Therapy: Patient Spontanous Breathing  Post-op Assessment: Report given to RN and Post -op Vital signs reviewed and stable  Post vital signs: Reviewed and stable  Last Vitals:  Vitals Value Taken Time  BP    Temp    Pulse 78 03/23/21 0817  Resp 18 03/23/21 0817  SpO2 97 % 03/23/21 0817  Vitals shown include unvalidated device data.  Last Pain:  Vitals:   03/23/21 0700  TempSrc: Oral  PainSc: 0-No pain         Complications: No notable events documented.

## 2021-03-23 NOTE — Progress Notes (Signed)
PROGRESS NOTE    [name]  QQI:[id number] DOB: [birth year] DOA: 03/20/2021 PCP: [name], MD     Brief Narrative:  Adam [name] is a 70 y.o. male with medical history significant of chronic A. fib on Tikosyn and Eliquis, CAD status post stenting, IDDM, HTN, HLD, came with palpitations.   Patient has not been able to take Cardizem for last 3 days due to insurance issue.  And this morning he started to have palpitations and was found heart rate in the 150s.  Denies any chest pain, no shortness of breath.  He is a chronic alcohol drinker, last drink was this morning with a half a glass of wine.   Three days ago, patient developed acute onset of left-sided facial droop, and drooling and slurred speech, went to see PCP who ordered a brain MRI which showed negative for acute stroke.  Denies any weakness or numbness of any of the limbs, no hearing changes no blurry vision.   ED Course: Heart rate in the 140s, EKG showed uncontrolled A. fib, blood pressure stable, no hypoxia chest x-ray clean.  Cardiology consulted, recommend Cardizem drip and admission.  Patient is scheduled for cardioversion on Monday 6/20.  Hospitalization complicated by alcohol withdrawal. He required IV ativan, sitter at bedside, wrist restraints.   New events last 24 hours / Subjective: Patient was seen prior to cardioversion.  He had no new physical complaints, has been off restraints and improved from alcohol withdrawal standpoint  Assessment & Plan:   Principal Problem:   Atrial fibrillation with rapid ventricular response (HCC) Active Problems:   CAD (coronary artery disease)   Hyperlipidemia associated with type 2 diabetes mellitus (HCC)   Alcohol abuse   Benign essential hypertension   A. fib RVR -Remains on Eliquis, amiodarone -Cardiology following -Status post cardioversion 6/20   [name] -Started on steroid, valacyclovir  Hypertension -Continue metoprolol  Alcohol  abuse -CIWA protocol for withdrawal  Insulin-dependent diabetes -Continue Lantus, sliding scale insulin, Jardiance, Tradjenta  Mood disorder -Continue Depakote, Cymbalta  Hypokalemia -Replace, trend  Obesity -Estimated body mass index is 34.44 kg/m as calculated from the following:   Height as of this encounter: 5\' 10"  (1.778 m).   Weight as of this encounter: 108.9 kg.   DVT prophylaxis:   apixaban (ELIQUIS) tablet 5 mg  Code Status:     Code Status Orders  (From admission, onward)           Start     Ordered   03/20/21 1546  Do not attempt resuscitation (DNR)  Continuous       Question Answer Comment  In the event of cardiac or respiratory ARREST Do not call a "code blue"   In the event of cardiac or respiratory ARREST Do not perform Intubation, CPR, defibrillation or ACLS   In the event of cardiac or respiratory ARREST Use medication by any route, position, wound care, and other measures to relive pain and suffering. May use oxygen, suction and manual treatment of airway obstruction as needed for comfort.      03/20/21 1545           Code Status History     Date Active Date Inactive Code Status Order ID Comments User Context   02/25/2020 0739 03/03/2020 1927 DNR [id number]  Annamaria Helling Inpatient   02/20/2020 1922 02/25/2020 0739 DNR [id number]  Elwyn Reach, MD ED   02/14/2020 0839 02/18/2020 2259 DNR [id number]  Domenic Polite, MD Inpatient  02/08/2020 2246 02/14/2020 0839 Full Code [id number]  [name], MD ED   10/25/2019 0015 10/27/2019 2015 Full Code [id number]  [name], MD ED   09/12/2018 1829 09/13/2018 1905 Full Code [id number]  [name] Inpatient   03/31/2018 1630 04/01/2018 0015 Full Code [id number]  [name], MD Inpatient   12/12/2017 1624 12/18/2017 1801 Full Code [id number]  [name], MD Inpatient   12/12/2017 1622 12/12/2017 1624 Full Code [id number]  [name], MD Inpatient      Family  Communication: None at bedside Disposition Plan:  Status is: Inpatient  Remains inpatient appropriate because:Hemodynamically unstable, Ongoing diagnostic testing needed not appropriate for outpatient work up, IV treatments appropriate due to intensity of illness or inability to take PO, and Inpatient level of care appropriate due to severity of illness  Dispo: The patient is from: Home              Anticipated d/c is to: Home              Patient currently is not medically stable to d/c.   Difficult to place patient No      Consultants:  Cardiology   Procedures:  None  Antimicrobials:  Anti-infectives (From admission, onward)    Start     Dose/Rate Route Frequency Ordered Stop   03/20/21 1600  valACYclovir (VALTREX) tablet 1,000 mg        1,000 mg Oral 3 times daily 03/20/21 1546          Objective: Vitals:   03/23/21 0827 03/23/21 0837 03/23/21 0900 03/23/21 1138  BP: 102/73 118/71 119/84 134/79  Pulse: 80 86 77 75  Resp: 18 (!) 22 (!) 21   Temp:   (!) 97.5 F (36.4 C)   TempSrc:   Oral   SpO2: 96% 98% 99%   Weight:      Height:        Intake/Output Summary (Last 24 hours) at 03/23/2021 1508 Last data filed at 03/23/2021 1341 Gross per 24 hour  Intake 620 ml  Output 1125 ml  Net -505 ml    Filed Weights   03/22/21 0500 03/23/21 0400 03/23/21 0700  Weight: 106.3 kg 106.2 kg 108.9 kg   Examination: General exam: Appears calm and comfortable  Respiratory system: Clear to auscultation. Respiratory effort normal. Cardiovascular system: S1 & S2 heard, irregular rhythm Gastrointestinal system: Abdomen is nondistended, soft and nontender. Normal bowel sounds heard. Central nervous system: Alert and oriented. Non focal exam. Speech clear  Extremities: Symmetric in appearance bilaterally  Skin: No rashes, lesions or ulcers on exposed skin  Psychiatry: Judgement and insight appear stable. Mood & affect appropriate.     Data Reviewed: I have personally  reviewed following labs and imaging studies  CBC: Recent Labs  Lab 03/20/21 1317 03/22/21 0329 03/23/21 0957  WBC 6.9 11.3* 12.7*  HGB 13.0 13.2 13.3  HCT 39.3 39.6 40.4  MCV 95.6 94.7 95.7  PLT 184 215 834    Basic Metabolic Panel: Recent Labs  Lab 03/20/21 1317 03/20/21 1650 03/21/21 0118 03/22/21 0329 03/23/21 0957  NA 141  --  139 137 139  K 3.8  --  3.9 3.4* 3.5  CL 109  --  104 100 106  CO2 23  --  25 21* 25  GLUCOSE 119*  --  104* 105* 120*  BUN 9  --  11 16 23   CREATININE 0.77  --  0.80 0.86 0.98  CALCIUM 9.5  --  9.4 10.1 9.7  MG  --  1.9  --   --  2.4  PHOS  --  3.2  --   --   --     GFR: Estimated Creatinine Clearance: 86.7 mL/min (by C-G formula based on SCr of 0.98 mg/dL). Liver Function Tests: No results for input(s): AST, ALT, ALKPHOS, BILITOT, PROT, ALBUMIN in the last 168 hours.  No results for input(s): LIPASE, AMYLASE in the last 168 hours.  No results for input(s): AMMONIA in the last 168 hours. Coagulation Profile: Recent Labs  Lab 03/23/21 0957  INR 1.2    Cardiac Enzymes: No results for input(s): CKTOTAL, CKMB, CKMBINDEX, TROPONINI in the last 168 hours. BNP (last 3 results) No results for input(s): PROBNP in the last 8760 hours. HbA1C: Recent Labs    03/20/21 1650  HGBA1C 5.7*   CBG: Recent Labs  Lab 03/22/21 1041 03/22/21 1623 03/22/21 2106 03/23/21 0606 03/23/21 1103  GLUCAP 151* 159* 170* 132* 124*    Lipid Profile: No results for input(s): CHOL, HDL, LDLCALC, TRIG, CHOLHDL, LDLDIRECT in the last 72 hours. Thyroid Function Tests: Recent Labs    03/20/21 1650  TSH 2.034    Anemia Panel: No results for input(s): VITAMINB12, FOLATE, FERRITIN, TIBC, IRON, RETICCTPCT in the last 72 hours. Sepsis Labs: No results for input(s): PROCALCITON, LATICACIDVEN in the last 168 hours.  Recent Results (from the past 240 hour(s))  Resp Panel by RT-PCR (Flu A&B, Covid) Nasopharyngeal Swab     Status: None   Collection  Time: 03/16/21 11:25 AM   Specimen: Nasopharyngeal Swab; Nasopharyngeal(NP) swabs in vial transport medium  Result Value Ref Range Status   SARS Coronavirus 2 by RT PCR NEGATIVE NEGATIVE Final    Comment: (NOTE) SARS-CoV-2 target nucleic acids are NOT DETECTED.  The SARS-CoV-2 RNA is generally detectable in upper respiratory specimens during the acute phase of infection. The lowest concentration of SARS-CoV-2 viral copies this assay can detect is 138 copies/mL. A negative result does not preclude SARS-Cov-2 infection and should not be used as the sole basis for treatment or other patient management decisions. A negative result may occur with  improper specimen collection/handling, submission of specimen other than nasopharyngeal swab, presence of viral mutation(s) within the areas targeted by this assay, and inadequate number of viral copies(<138 copies/mL). A negative result must be combined with clinical observations, patient history, and epidemiological information. The expected result is Negative.  Fact Sheet for Patients:  EntrepreneurPulse.com.au  Fact Sheet for Healthcare Providers:  IncredibleEmployment.be  This test is no t yet approved or cleared by the [place] FDA and  has been authorized for detection and/or diagnosis of SARS-CoV-2 by FDA under an Emergency Use Authorization (EUA). This EUA will remain  in effect (meaning this test can be used) for the duration of the COVID-19 declaration under Section 564(b)(1) of the Act, 21 U.S.C.section 360bbb-3(b)(1), unless the authorization is terminated  or revoked sooner.       Influenza A by PCR NEGATIVE NEGATIVE Final   Influenza B by PCR NEGATIVE NEGATIVE Final    Comment: (NOTE) The Xpert Xpress SARS-CoV-2/FLU/RSV plus assay is intended as an aid in the diagnosis of influenza from Nasopharyngeal swab specimens and should not be used as a sole basis for treatment. Nasal washings  and aspirates are unacceptable for Xpert Xpress SARS-CoV-2/FLU/RSV testing.  Fact Sheet for Patients: EntrepreneurPulse.com.au  Fact Sheet for Healthcare Providers: IncredibleEmployment.be  This test is not yet approved or cleared by the [place] and  has been authorized for detection and/or diagnosis of SARS-CoV-2 by FDA under an Emergency Use Authorization (EUA). This EUA will remain in effect (meaning this test can be used) for the duration of the COVID-19 declaration under Section 564(b)(1) of the Act, 21 U.S.C. section 360bbb-3(b)(1), unless the authorization is terminated or revoked.  Performed at [facility], [place] [address] [place], [place] [address]   SARS CORONAVIRUS 2 (TAT 6-24 HRS) Nasopharyngeal Nasopharyngeal Swab     Status: None   Collection Time: 03/20/21  2:55 PM   Specimen: Nasopharyngeal Swab  Result Value Ref Range Status   SARS Coronavirus 2 NEGATIVE NEGATIVE Final    Comment: (NOTE) SARS-CoV-2 target nucleic acids are NOT DETECTED.  The SARS-CoV-2 RNA is generally detectable in upper and lower respiratory specimens during the acute phase of infection. Negative results do not preclude SARS-CoV-2 infection, do not rule out co-infections with other pathogens, and should not be used as the sole basis for treatment or other patient management decisions. Negative results must be combined with clinical observations, patient history, and epidemiological information. The expected result is Negative.  Fact Sheet for Patients: SugarRoll.be  Fact Sheet for Healthcare Providers: https://www.woods-mathews.com/  This test is not yet approved or cleared by the [place] FDA and  has been authorized for detection and/or diagnosis of SARS-CoV-2 by FDA under an Emergency Use Authorization (EUA). This EUA will remain  in effect (meaning this test can be used) for the  duration of the COVID-19 declaration under Se ction 564(b)(1) of the Act, 21 U.S.C. section 360bbb-3(b)(1), unless the authorization is terminated or revoked sooner.  Performed at [facility], [place] [address], [place], [place] [address]        Radiology Studies: No results found.    Scheduled Meds:  allopurinol  300 mg Oral Daily   amiodarone  400 mg Oral BID   apixaban  5 mg Oral BID   divalproex  125 mg Oral TID   DULoxetine  60 mg Oral Daily   empagliflozin  25 mg Oral QAC breakfast   umeclidinium bromide  1 puff Inhalation Daily   And   fluticasone furoate-vilanterol  1 puff Inhalation Daily   folic acid  1 mg Oral Daily   gabapentin  600 mg Oral QHS   icosapent Ethyl  2 g Oral BID   insulin aspart  0-15 Units Subcutaneous TID WC   insulin glargine  80 Units Subcutaneous Daily   linagliptin  5 mg Oral Daily   magnesium oxide  400 mg Oral Daily   metoprolol tartrate  50 mg Oral TID   montelukast  10 mg Oral QHS   multivitamin with minerals  1 tablet Oral Daily   potassium chloride  40 mEq Oral Q4H   predniSONE  60 mg Oral Q breakfast   thiamine  500 mg Oral Daily   valACYclovir  1,000 mg Oral TID   Continuous Infusions:  diltiazem (CARDIZEM) infusion 12.5 mg/hr (03/23/21 0916)     LOS: 3 days      Time spent: 20 minutes   Dessa Phi, DO [facility] 03/23/2021, 3:08 PM   Available via Epic secure chat 7am-7pm After these hours, please refer to coverage provider listed on [email or web address]

## 2021-03-23 NOTE — Progress Notes (Signed)
Progress Note  Patient Name: Adam [name] Date of Encounter: 03/23/2021  [facility] Cardiologist: Dr. Rayann Heman  Subjective   "It was a rough weekend, but I am feeling much better" mouth is dry, no CP, no SOB  Inpatient Medications    Scheduled Meds:  [MAR Hold] allopurinol  300 mg Oral Daily   [MAR Hold] amiodarone  400 mg Oral BID   [MAR Hold] apixaban  5 mg Oral BID   [MAR Hold] divalproex  125 mg Oral TID   [MAR Hold] DULoxetine  60 mg Oral Daily   [MAR Hold] empagliflozin  25 mg Oral QAC breakfast   [MAR Hold] umeclidinium bromide  1 puff Inhalation Daily   And   [MAR Hold] fluticasone furoate-vilanterol  1 puff Inhalation Daily   [MAR Hold] folic acid  1 mg Oral Daily   [MAR Hold] gabapentin  600 mg Oral QHS   [MAR Hold] icosapent Ethyl  2 g Oral BID   [MAR Hold] insulin aspart  0-15 Units Subcutaneous TID WC   [MAR Hold] insulin glargine  80 Units Subcutaneous Daily   [MAR Hold] linagliptin  5 mg Oral Daily   [MAR Hold] magnesium oxide  400 mg Oral Daily   [MAR Hold] metoprolol tartrate  50 mg Oral TID   [MAR Hold] montelukast  10 mg Oral QHS   [MAR Hold] multivitamin with minerals  1 tablet Oral Daily   [MAR Hold] predniSONE  60 mg Oral Q breakfast   [MAR Hold] thiamine  500 mg Oral Daily   [MAR Hold] valACYclovir  1,000 mg Oral TID   Continuous Infusions:  sodium chloride     diltiazem (CARDIZEM) infusion 12.5 mg/hr (03/22/21 2150)   PRN Meds: [MAR Hold] acetaminophen, [MAR Hold] guaiFENesin-dextromethorphan, [MAR Hold] hydrALAZINE, [MAR Hold] LORazepam **OR** [MAR Hold] LORazepam, [MAR Hold] ondansetron (ZOFRAN) IV   Vital Signs    Vitals:   03/23/21 0018 03/23/21 0400 03/23/21 0526 03/23/21 0700  BP: (!) 111/91 97/64  120/76  Pulse: 84 87 (!) 44 (!) 113  Resp: 20 (!) 23 17 (!) 22  Temp: 98.4 F (36.9 C) 98.4 F (36.9 C)  (!) 97.5 F (36.4 C)  TempSrc: Oral Oral  Oral  SpO2: 94% 94% 92% 99%  Weight:  106.2 kg  108.9 kg  Height:    5\' 10"   (1.778 m)    Intake/Output Summary (Last 24 hours) at 03/23/2021 0743 Last data filed at 03/23/2021 0432 Gross per 24 hour  Intake 480 ml  Output 1625 ml  Net -1145 ml   Last 3 Weights 03/23/2021 03/23/2021 03/22/2021  Weight (lbs) 240 lb 234 lb 2.1 oz 234 lb 5.6 oz  Weight (kg) 108.863 kg 106.2 kg 106.3 kg      Telemetry    AFib 100's - Personally Reviewed  ECG    No new EKGs - Personally Reviewed  Physical Exam   GEN: No acute distress.   Neck: No JVD Cardiac: irreg-irreg, no murmurs, rubs, or gallops.  Respiratory: CTA b/l, not wheezing. GI: Soft, nontender, non-distended  MS: No edema; No deformity. Neuro:  Nonfocal  Psych: Normal affect   Labs    High Sensitivity Troponin:   Recent Labs  Lab 03/20/21 1317 03/20/21 1515  TROPONINIHS 15 16      Chemistry Recent Labs  Lab 03/16/21 1100 03/16/21 1112 03/20/21 1317 03/21/21 0118 03/22/21 0329  NA 137   < > 141 139 137  K 3.4*   < > 3.8 3.9 3.4*  CL 105   < > 109 104 100  CO2 19*  --  23 25 21*  GLUCOSE 100*   < > 119* 104* 105*  BUN 12   < > 9 11 16   CREATININE 0.72   < > 0.77 0.80 0.86  CALCIUM 8.9  --  9.5 9.4 10.1  PROT 6.6  --   --   --   --   ALBUMIN 3.4*  --   --   --   --   AST 31  --   --   --   --   ALT 31  --   --   --   --   ALKPHOS 51  --   --   --   --   BILITOT 1.1  --   --   --   --   GFRNONAA >60  --  >60 >60 >60  ANIONGAP 13  --  9 10 16*   < > = values in this interval not displayed.     Hematology Recent Labs  Lab 03/16/21 1100 03/16/21 1112 03/20/21 1317 03/22/21 0329  WBC 7.6  --  6.9 11.3*  RBC 3.79*  --  4.11* 4.18*  HGB 12.1* 11.6* 13.0 13.2  HCT 36.2* 34.0* 39.3 39.6  MCV 95.5  --  95.6 94.7  MCH 31.9  --  31.6 31.6  MCHC 33.4  --  33.1 33.3  RDW 14.7  --  15.0 14.8  PLT 182  --  184 215    BNP Recent Labs  Lab 03/20/21 1317  BNP 286.3*     DDimer No results for input(s): DDIMER in the last 168 hours.   Radiology    No results found.  Cardiac  Studies     02/11/20; TTE IMPRESSIONS   1. Technically difficult study with limited views and patient  uncooperative. Grossly normal LV systolic function. Left ventricular  ejection fraction, by estimation, is 60 to 65%. Image quality is not  sufficient to assess for regional wall motiuon  abnormalities. There is mild left ventricular hypertrophy. Left  ventricular diastolic parameters are indeterminate.   2. Right ventricle is pooly visualized but grossly normal size with  mildly reduced systolic function.   3. The mitral valve is normal in structure. Mild mitral valve  regurgitation.   4. The aortic valve was not well visualized. Aortic valve regurgitation  is not visualized. No aortic stenosis is present.   10/31/2017 EPS/Ablation CONCLUSIONS: 1. Atrial fibrillation upon presentation.   2. Intracardiac echo reveals a moderate sized left atrium with four separate pulmonary veins without evidence of pulmonary vein stenosis. 3. Successful electrical isolation and anatomical encircling of all four pulmonary veins with radiofrequency current.  A WACA approach was used 3. Additional left atrial ablation was performed with a standard box lesion created along the posterior wall of the left atrium 4. Atrial fibrillation successfully cardioverted to sinus rhythm. 5. No early apparent complications.     10/30/2017: TEE Study Conclusions - Left ventricle: The cavity size was normal. Wall thickness was   normal. Systolic function was normal. The estimated ejection   fraction was in the range of 55% to 60%. Wall motion was normal;   there were no regional wall motion abnormalities. - Mitral valve: Mildly calcified annulus. - Left atrium: No evidence of thrombus in the atrial cavity or   appendage. No spontaneous echo contrast was observed. The   appendage was morphologically a left appendage, multilobulated,   and  of normal size. Emptying velocity was mildly reduced. - Right atrium: No  evidence of thrombus in the atrial cavity or   appendage. - Atrial septum: There was a very small patent foramen ovale.   Doppler showed a trivial left-to-right shunt, in the baseline   state.  Patient Profile     70 y.o. male CAD (CABG 2011), HTN, HLD, DM, OSA, ETOH abuse (hospitalized 10/24/19 for GI bleeding and alcohol withdrawal  was discharged to rehab for a prolonged period of time for Wernicke's encephalopathy), continues to drink , COPD admitted with AFlutter RVR    AFib HX PVI ablation 03/31/2018, Dr.Allred Initial diagnosis appears post-op 2011 after his CABG though  Had long hiatus without any until 2019) AADHx Tikosyn started 12/13/2017, is current Pt has stated he was on amiodarone back in 2011 briefly, turned his skin purple OAC Hx Xarelto >> GIB >> Eliquis     MDT ILR implanted 07/02/19 for AFib surveillence  Assessment & Plan    Paroxysmal AFib, AFlutter       RVR Prior PVI ablation, 2019 Tikosyn  > failed In d/w Dr. Rayann Heman, likely best to consider alternative to Tikosyn with recurrent breakthrough arrhythmias and ongoing ETOH abuse and medication noncompliance at times   Pt could  not be certain that he had not missed his medicines/eliquis specifically on admisison  Tikosyn was stopped > amiodarone Settled into AFib rates generally 100's, much better Planned for TEE/DCCV this AM   2. CAD No CP HS Trop 15 > 16, likely 2/2 RVR Home meds continued (except statin, will defer this to IM/attending)   3. HTN Better  4. ETOH abuse Withdrawal IM managing Feels much better today  8. Slurred speech/facial droop ER visit for this on 03/16/21 (onset approx 5 days prior) CT/MRI were negative Felt to be bell palsy As per IM  For questions or updates, please contact [place] Please consult [email or web address] for contact info under        Signed, Baldwin [place], PA-C  03/23/2021, 7:43 AM

## 2021-03-23 NOTE — Anesthesia Postprocedure Evaluation (Signed)
Anesthesia Post Note  Patient: Adam [name]  Procedure(s) Performed: TRANSESOPHAGEAL ECHOCARDIOGRAM (TEE) CARDIOVERSION     Patient location during evaluation: Endoscopy Anesthesia Type: MAC Level of consciousness: awake and alert Pain management: pain level controlled Vital Signs Assessment: post-procedure vital signs reviewed and stable Respiratory status: spontaneous breathing, nonlabored ventilation and respiratory function stable Cardiovascular status: blood pressure returned to baseline and stable Postop Assessment: no apparent nausea or vomiting Anesthetic complications: no   No notable events documented.  Last Vitals:  Vitals:   03/23/21 0817 03/23/21 0827  BP: 95/67 102/73  Pulse: 78 80  Resp: 18 18  Temp: 36.6 C   SpO2: 97% 96%    Last Pain:  Vitals:   03/23/21 0827  TempSrc:   PainSc: 0-No pain                 Lynda Rainwater

## 2021-03-23 NOTE — Interval H&P Note (Signed)
History and Physical Interval Note:  03/23/2021 7:10 AM  Adam [name]  has presented today for surgery, with the diagnosis of AFIB A FLUTTER.  The various methods of treatment have been discussed with the patient and family. After consideration of risks, benefits and other options for treatment, the patient has consented to  Procedure(s): TRANSESOPHAGEAL ECHOCARDIOGRAM (TEE) (N/A) CARDIOVERSION (N/A) as a surgical intervention.  The patient's history has been reviewed, patient examined, no change in status, stable for surgery.  I have reviewed the patient's chart and labs.  Questions were answered to the patient's satisfaction.     [name]

## 2021-03-23 NOTE — Anesthesia Preprocedure Evaluation (Signed)
Anesthesia Evaluation  Patient identified by MRN, date of birth, ID band Patient awake    Reviewed: Allergy & Precautions, NPO status , Patient's Chart, lab work & pertinent test results  Airway Mallampati: II  TM Distance: >3 FB Neck ROM: Full    Dental no notable dental hx.    Pulmonary asthma , sleep apnea , COPD, former smoker,    Pulmonary exam normal breath sounds clear to auscultation       Cardiovascular hypertension, + CAD  Normal cardiovascular exam+ dysrhythmias Atrial Fibrillation  Rhythm:Regular Rate:Normal     Neuro/Psych Anxiety Depression    GI/Hepatic Neg liver ROS, GERD  ,Patient received Oral Contrast Agents,  Endo/Other  diabetes  Renal/GU negative Renal ROS     Musculoskeletal   Abdominal (+) + obese,   Peds  Hematology   Anesthesia Other Findings   Reproductive/Obstetrics                             Anesthesia Physical  Anesthesia Plan  ASA: III  Anesthesia Plan: MAC   Post-op Pain Management:    Induction: Intravenous  PONV Risk Score and Plan: 1 and Ondansetron and Treatment may vary due to age or medical condition  Airway Management Planned: Nasal Cannula  Additional Equipment:   Intra-op Plan:   Post-operative Plan:   Informed Consent: I have reviewed the patients History and Physical, chart, labs and discussed the procedure including the risks, benefits and alternatives for the proposed anesthesia with the patient or authorized representative who has indicated his/her understanding and acceptance.     Dental advisory given  Plan Discussed with: CRNA and Anesthesiologist  Anesthesia Plan Comments:         Anesthesia Quick Evaluation

## 2021-03-23 NOTE — Care Management Important Message (Signed)
Important Message  Patient Details  Name: Adam [name] MRN: [id number] Date of Birth: 19-May-1951   Medicare Important Message Given:  Yes     Orbie Pyo 03/23/2021, 3:51 PM

## 2021-03-23 NOTE — CV Procedure (Addendum)
    Transesophageal Echocardiogram Note  Adam [name] [id number] 1951-06-12  Procedure: Transesophageal Echocardiogram Indications: atrial fib   Procedure Details Consent: Obtained Time Out: Verified patient identification, verified procedure, site/side was marked, verified correct patient position, special equipment/implants available, Radiology Safety Procedures followed,  medications/allergies/relevent history reviewed, required imaging and test results available.  Performed  Medications:  During this procedure the patient is administered Lidocaine 100 mg, Proppofol 200 mg via drip, Precidex 20 mcg IV by CRNA Lars Mage and Merleen Nicely for sedation.  The patient's heart rate, blood pressure, and oxygen saturation are monitored continuously during the procedure. The period of conscious sedation is 30  minutes, of which I was present face-to-face 100% of this time.  Left Ventrical:  difficult views,  off axis views. Moderately reduced LV function.   Mitral Valve: mild MR   Aortic Valve: calcified NCC,  no AS or AI   Tricuspid Valve: trace - mild TR,  not well visualized   Pulmonic Valve: not well visualized   Left Atrium/ Left atrial appendage: very large.  No thrombi observed   Atrial septum: + PFO by color doppler   Aorta: 1-2+ calcified plaque.    Complications: No apparent complications Patient did tolerate procedure well.     Cardioversion Note  Adam [name] [id number] 1951/04/17  Procedure: DC Cardioversion Indications: atrial fib   Procedure Details Consent: Obtained Time Out: Verified patient identification, verified procedure, site/side was marked, verified correct patient position, special equipment/implants available, Radiology Safety Procedures followed,  medications/allergies/relevent history reviewed, required imaging and test results available.  Performed  The patient has been on adequate anticoagulation.  The patient received IV Propofol drip ( see  above)  for sedation.  Synchronous cardioversion was performed at 200  joules.  The cardioversion was successful     Complications: No apparent complications Patient did tolerate procedure well.   Thayer Headings, Brooke Bonito., MD, [facility] 03/23/2021, 8:47 AM

## 2021-03-23 NOTE — Anesthesia Procedure Notes (Signed)
Procedure Name: MAC Date/Time: 03/23/2021 7:55 AM Performed by: Valda Favia, CRNA Pre-anesthesia Checklist: Patient identified, Emergency Drugs available, Suction available and Patient being monitored Patient Re-evaluated:Patient Re-evaluated prior to induction Oxygen Delivery Method: Nasal cannula

## 2021-03-24 ENCOUNTER — Other Ambulatory Visit (HOSPITAL_COMMUNITY): Payer: Self-pay

## 2021-03-24 DIAGNOSIS — I4891 Unspecified atrial fibrillation: Secondary | ICD-10-CM | POA: Diagnosis not present

## 2021-03-24 LAB — CBC
HCT: 38.6 % — ABNORMAL LOW (ref 39.0–52.0)
Hemoglobin: 12.6 g/dL — ABNORMAL LOW (ref 13.0–17.0)
MCH: 31.6 pg (ref 26.0–34.0)
MCHC: 32.6 g/dL (ref 30.0–36.0)
MCV: 96.7 fL (ref 80.0–100.0)
Platelets: 249 10*3/uL (ref 150–400)
RBC: 3.99 MIL/uL — ABNORMAL LOW (ref 4.22–5.81)
RDW: 15.4 % (ref 11.5–15.5)
WBC: 14.5 10*3/uL — ABNORMAL HIGH (ref 4.0–10.5)
nRBC: 0 % (ref 0.0–0.2)

## 2021-03-24 LAB — BASIC METABOLIC PANEL
Anion gap: 10 (ref 5–15)
BUN: 28 mg/dL — ABNORMAL HIGH (ref 8–23)
CO2: 25 mmol/L (ref 22–32)
Calcium: 10 mg/dL (ref 8.9–10.3)
Chloride: 104 mmol/L (ref 98–111)
Creatinine, Ser: 1.08 mg/dL (ref 0.61–1.24)
GFR, Estimated: >60 mL/min (ref >60)
Glucose, Bld: 140 mg/dL — ABNORMAL HIGH (ref 70–99)
Potassium: 4.3 mmol/L (ref 3.5–5.1)
Sodium: 139 mmol/L (ref 135–145)

## 2021-03-24 LAB — GLUCOSE, CAPILLARY
Glucose-Capillary: 127 mg/dL — ABNORMAL HIGH (ref 70–99)
Glucose-Capillary: 151 mg/dL — ABNORMAL HIGH (ref 70–99)

## 2021-03-24 LAB — MAGNESIUM: Magnesium: 2.5 mg/dL — ABNORMAL HIGH (ref 1.7–2.4)

## 2021-03-24 MED ORDER — PREDNISONE 20 MG PO TABS
60.0000 mg | ORAL_TABLET | Freq: Every day | ORAL | 0 refills | Status: AC
  Filled 2021-03-24: qty 6, 2d supply, fill #0

## 2021-03-24 MED ORDER — VALACYCLOVIR HCL 1 G PO TABS
1000.0000 mg | ORAL_TABLET | Freq: Three times a day (TID) | ORAL | 0 refills | Status: AC
  Filled 2021-03-24: qty 6, 2d supply, fill #0

## 2021-03-24 MED ORDER — METOPROLOL TARTRATE 50 MG PO TABS
50.0000 mg | ORAL_TABLET | Freq: Three times a day (TID) | ORAL | 0 refills | Status: DC
  Filled 2021-03-24: qty 90, 30d supply, fill #0

## 2021-03-24 MED ORDER — FOLIC ACID 1 MG PO TABS
1.0000 mg | ORAL_TABLET | Freq: Every day | ORAL | 2 refills | Status: AC
  Filled 2021-03-24: qty 30, 30d supply, fill #0

## 2021-03-24 MED ORDER — AMIODARONE HCL 200 MG PO TABS
400.0000 mg | ORAL_TABLET | Freq: Two times a day (BID) | ORAL | 0 refills | Status: DC
  Filled 2021-03-24: qty 88, 30d supply, fill #0

## 2021-03-24 NOTE — Progress Notes (Signed)
Progress Note  Patient Name: Adam [name] Date of Encounter: 03/24/2021  [facility] Cardiologist: Dr. Rayann Heman  Subjective   "I feel much better",  no CP, no SOB  Inpatient Medications    Scheduled Meds:  allopurinol  300 mg Oral Daily   amiodarone  400 mg Oral BID   apixaban  5 mg Oral BID   divalproex  125 mg Oral TID   DULoxetine  60 mg Oral Daily   empagliflozin  25 mg Oral QAC breakfast   umeclidinium bromide  1 puff Inhalation Daily   And   fluticasone furoate-vilanterol  1 puff Inhalation Daily   folic acid  1 mg Oral Daily   gabapentin  600 mg Oral QHS   icosapent Ethyl  2 g Oral BID   insulin aspart  0-15 Units Subcutaneous TID WC   insulin glargine  80 Units Subcutaneous Daily   linagliptin  5 mg Oral Daily   magnesium oxide  400 mg Oral Daily   metoprolol tartrate  50 mg Oral TID   montelukast  10 mg Oral QHS   multivitamin with minerals  1 tablet Oral Daily   predniSONE  60 mg Oral Q breakfast   thiamine  500 mg Oral Daily   valACYclovir  1,000 mg Oral TID   Continuous Infusions:   PRN Meds: acetaminophen, guaiFENesin-dextromethorphan, hydrALAZINE, LORazepam, ondansetron (ZOFRAN) IV   Vital Signs    Vitals:   03/23/21 2343 03/24/21 0347 03/24/21 0724 03/24/21 0734  BP: 120/84 123/90  116/75  Pulse: 82 78  77  Resp: 20 18  19   Temp: (!) 97.5 F (36.4 C) (!) 97.5 F (36.4 C)  97.7 F (36.5 C)  TempSrc: Oral Oral  Oral  SpO2: 96% 97% 93% 97%  Weight:  105.9 kg    Height:        Intake/Output Summary (Last 24 hours) at 03/24/2021 0938 Last data filed at 03/24/2021 0200 Gross per 24 hour  Intake 942 ml  Output 1125 ml  Net -183 ml   Last 3 Weights 03/24/2021 03/23/2021 03/23/2021  Weight (lbs) 233 lb 7.5 oz 240 lb 234 lb 2.1 oz  Weight (kg) 105.9 kg 108.863 kg 106.2 kg      Telemetry    SR 80's - Personally Reviewed  ECG    No new EKGs post DCCV- Personally Reviewed  Physical Exam   GEN: No acute distress.   Neck: No  JVD Cardiac: RRR, no murmurs, rubs, or gallops.  Respiratory: CTA b/l, not wheezing. GI: Soft, nontender, non-distended  MS: No edema; No deformity. Neuro:  Nonfocal  Psych: Normal affect   Labs    High Sensitivity Troponin:   Recent Labs  Lab 03/20/21 1317 03/20/21 1515  TROPONINIHS 15 16      Chemistry Recent Labs  Lab 03/22/21 0329 03/23/21 0957 03/24/21 0236  NA 137 139 139  K 3.4* 3.5 4.3  CL 100 106 104  CO2 21* 25 25  GLUCOSE 105* 120* 140*  BUN 16 23 28*  CREATININE 0.86 0.98 1.08  CALCIUM 10.1 9.7 10.0  GFRNONAA >60 >60 >60  ANIONGAP 16* 8 10     Hematology Recent Labs  Lab 03/22/21 0329 03/23/21 0957 03/24/21 0236  WBC 11.3* 12.7* 14.5*  RBC 4.18* 4.22 3.99*  HGB 13.2 13.3 12.6*  HCT 39.6 40.4 38.6*  MCV 94.7 95.7 96.7  MCH 31.6 31.5 31.6  MCHC 33.3 32.9 32.6  RDW 14.8 14.9 15.4  PLT 215 229 249  BNP Recent Labs  Lab 03/20/21 1317  BNP 286.3*     DDimer No results for input(s): DDIMER in the last 168 hours.   Radiology     Cardiac Studies   03/23/21: TEE IMPRESSIONS   1. Left ventricular ejection fraction, by estimation, is 45 to 50%. The  left ventricle has mildly decreased function.   2. Right ventricular systolic function is normal. The right ventricular  size is not well visualized.   3. No left atrial/left atrial appendage thrombus was detected.   4. The mitral valve is grossly normal. Mild to moderate mitral valve  regurgitation.   5. The aortic valve is normal in structure. Aortic valve regurgitation is  not visualized.   6. Evidence of atrial level shunting detected by color flow Doppler.    02/11/20; TTE IMPRESSIONS   1. Technically difficult study with limited views and patient  uncooperative. Grossly normal LV systolic function. Left ventricular  ejection fraction, by estimation, is 60 to 65%. Image quality is not  sufficient to assess for regional wall motiuon  abnormalities. There is mild left ventricular  hypertrophy. Left  ventricular diastolic parameters are indeterminate.   2. Right ventricle is pooly visualized but grossly normal size with  mildly reduced systolic function.   3. The mitral valve is normal in structure. Mild mitral valve  regurgitation.   4. The aortic valve was not well visualized. Aortic valve regurgitation  is not visualized. No aortic stenosis is present.   10/31/2017 EPS/Ablation CONCLUSIONS: 1. Atrial fibrillation upon presentation.   2. Intracardiac echo reveals a moderate sized left atrium with four separate pulmonary veins without evidence of pulmonary vein stenosis. 3. Successful electrical isolation and anatomical encircling of all four pulmonary veins with radiofrequency current.  A WACA approach was used 3. Additional left atrial ablation was performed with a standard box lesion created along the posterior wall of the left atrium 4. Atrial fibrillation successfully cardioverted to sinus rhythm. 5. No early apparent complications.     10/30/2017: TEE Study Conclusions - Left ventricle: The cavity size was normal. Wall thickness was   normal. Systolic function was normal. The estimated ejection   fraction was in the range of 55% to 60%. Wall motion was normal;   there were no regional wall motion abnormalities. - Mitral valve: Mildly calcified annulus. - Left atrium: No evidence of thrombus in the atrial cavity or   appendage. No spontaneous echo contrast was observed. The   appendage was morphologically a left appendage, multilobulated,   and of normal size. Emptying velocity was mildly reduced. - Right atrium: No evidence of thrombus in the atrial cavity or   appendage. - Atrial septum: There was a very small patent foramen ovale.   Doppler showed a trivial left-to-right shunt, in the baseline   state.  Patient Profile     70 y.o. male CAD (CABG 2011), HTN, HLD, DM, OSA, ETOH abuse (hospitalized 10/24/19 for GI bleeding and alcohol withdrawal  was  discharged to rehab for a prolonged period of time for Wernicke's encephalopathy), continues to drink , COPD admitted with AFlutter RVR    AFib HX PVI ablation 03/31/2018, Dr.Allred Initial diagnosis appears post-op 2011 after his CABG though  Had long hiatus without any until 2019) AADHx Tikosyn started 12/13/2017, is current Pt has stated he was on amiodarone back in 2011 briefly, turned his skin purple OAC Hx Xarelto >> GIB >> Eliquis     MDT ILR implanted 07/02/19 for AFib surveillence  Assessment &  Plan    Paroxysmal AFib, AFlutter       RVR Prior PVI ablation, 2019 Tikosyn  > failed In d/w Dr. Rayann Heman, likely best to consider alternative to Tikosyn with recurrent breakthrough arrhythmias and ongoing ETOH abuse and medication noncompliance at times   Pt could  not be certain that he had not missed his medicines/eliquis specifically on admisison  Tikosyn was stopped > amiodarone started Maintaining SR post [facility] yesterday Early AFib clinic follow  up is in place for amiodarone titration  Will plan amiodarone 400mg  BID for 2 weeks then reduce dose HR and BP look OK on TID lopressor  TEE yesterday with reduction in LVEF 45-50% (from 60-65%), this likely 2/2 RVR and can repeat echo in a few months    Dr. Rayann Heman will see him later today, anticipate OK for discharge from our perspective     2. CAD No CP HS Trop 15 > 16, likely 2/2 RVR Home meds continued (except statin, will defer this to IM/attending)   3. HTN Looks good  4. ETOH abuse Withdrawal IM managing Feels much better today  8. Slurred speech/facial droop ER visit for this on 03/16/21 (onset approx 5 days prior) CT/MRI were negative Felt to be bell palsy As per IM  For questions or updates, please contact [place] Please consult [email or web address] for contact info under        Signed, Baldwin [place], PA-C  03/24/2021, 9:38 AM

## 2021-03-24 NOTE — TOC Initial Note (Signed)
Transition of Care [facility]) - Initial/Assessment Note    Patient Details  Name: Adam [name] MRN: [id number] Date of Birth: 1951/05/25  Transition of Care [facility]) CM/SW Contact:    Zenon Mayo, RN Phone Number: 03/24/2021, 12:46 PM  Clinical Narrative:                 Patient is for dc today, wife is here to transport him home.  NCM provided him with SA resources.    Expected Discharge Plan: Home/Self Care Barriers to Discharge: No Barriers Identified   Patient Goals and CMS Choice Patient states their goals for this hospitalization and ongoing recovery are:: return home   Choice offered to / list presented to : NA  Expected Discharge Plan and Services Expected Discharge Plan: Home/Self Care In-house Referral: NA Discharge Planning Services: CM Consult Post Acute Care Choice: NA Living arrangements for the past 2 months: Single Family Home Expected Discharge Date: 03/24/21                 DME Agency: NA       HH Arranged: NA          Prior Living Arrangements/Services Living arrangements for the past 2 months: Single Family Home Lives with:: Spouse Patient language and need for interpreter reviewed:: Yes Do you feel safe going back to the place where you live?: Yes      Need for Family Participation in Patient Care: Yes (Comment) Care giver support system in place?: Yes (comment)   Criminal Activity/Legal Involvement Pertinent to Current Situation/Hospitalization: No - Comment as needed  Activities of Daily Living Home Assistive Devices/Equipment: None ADL Screening (condition at time of admission) Patient's cognitive ability adequate to safely complete daily activities?: Yes Is the patient deaf or have difficulty hearing?: No Does the patient have difficulty seeing, even when wearing glasses/contacts?: No Does the patient have difficulty concentrating, remembering, or making decisions?: No Patient able to express need for assistance with ADLs?:  Yes Does the patient have difficulty dressing or bathing?: No Independently performs ADLs?: No Does the patient have difficulty walking or climbing stairs?: No Weakness of Legs: None Weakness of Arms/Hands: None  Permission Sought/Granted                  Emotional Assessment Appearance:: Appears stated age Attitude/Demeanor/Rapport: Gracious Affect (typically observed): Appropriate Orientation: : Oriented to  Time, Oriented to Place, Oriented to Self, Oriented to Situation Alcohol / Substance Use: Not Applicable Psych Involvement: No (comment)  Admission diagnosis:  A-fib ([place]) [I48.91] Persistent atrial fibrillation ([place]) [I48.19] Atrial fibrillation with rapid ventricular response ([place]) [I48.91] Patient Active Problem List   Diagnosis Date Noted   Hypertension 06/05/2020   Protein-calorie malnutrition, severe 03/01/2020   Severe malnutrition ([place]) 02/28/2020   Pressure injury of skin 02/21/2020   Rapid atrial fibrillation ([place]) 02/20/2020   Atrial fibrillation with rapid ventricular response ([place]) 02/08/2020   High anion gap metabolic acidosis 42/87/6811   Secondary hypercoagulable state ([place]) 11/02/2019   Alcohol withdrawal ([place]) 10/24/2019   Paroxysmal atrial fibrillation ([place]) 10/24/2019   Rectal bleeding 10/24/2019   Degeneration of lumbar intervertebral disc 09/27/2019   Thoracic aortic aneurysm ([place]) 02/08/2019   Hemoptysis 10/06/2018   S/P right THA, 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   S/P hip replacement 09/12/2018   Low back pain 05/19/2018   Sleep apnea 01/09/2018   Persistent atrial fibrillation 12/12/2017   Allergic rhinitis 08/22/2015   Family history of colon cancer in mother deceased age 17 07-31-2015  Type 2 diabetes mellitus, controlled ([place]) 07/17/2013   COPD (chronic obstructive pulmonary disease) ([place]) 07/05/2013   CAD (coronary artery disease) 06/26/2013   Hypertension associated with diabetes ([place]) 06/26/2013   Hyperlipidemia associated with type 2 diabetes  mellitus ([place]) 06/26/2013   GERD (gastroesophageal reflux disease) 06/26/2013   History of atrial fibrillation without current medication 06/26/2013   Gout 06/26/2013   Osteoarthritis 06/26/2013   Asthma, mild persistent 06/26/2013   Alcohol abuse 06/26/2013   PTSD (post-traumatic stress disorder) 06/26/2013   HLA B27 (HLA B27 positive) 06/26/2013   Obesity (BMI 30-39.9) 06/26/2013   Benign essential hypertension 06/26/2013   PCP:  [name], MD Pharmacy:   [facility] [place], [address] [place] [address] [place] [address] Phone: [phone] Fax: [phone]  [facility] [address] [place] [address] Phone: [phone] Fax: [phone]  [id number] [place] [address] [place] [id number]: [phone]: [phone] [id number] [place] [facility] [address] [place] [id number]: [phone]: [phone]   Social Determinants of Health (SDOH) Interventions    Readmission Risk Interventions Readmission Risk Prevention Plan 03/24/2021 02/27/2020  Transportation Screening Complete Complete  PCP or Specialist Appt within 3-5 Days Complete Complete  HRI or HoBastropomplete Complete  Social Work Consult for ReCaleralanning/Counseling Complete Complete  Palliative Care Screening Not Applicable Not Applicable  Medication Review (RPress photographerComplete Complete  Some recent data might be hidden

## 2021-03-24 NOTE — TOC CAGE-AID Note (Signed)
Transition of Care [facility]) - CAGE-AID Screening   Patient Details  Name: Adam [name] MRN: [id number] Date of Birth: [birth year]  Transition of Care [facility]) CM/SW Contact:    Zenon Mayo, RN Phone Number: 03/24/2021, 12:39 PM   Clinical Narrative: NCM offered SA resources , patient has accepted them.     CAGE-AID Screening:    Have You Ever Felt You Ought to Cut Down on Your Drinking or Drug Use?: Yes Have People Annoyed You By Critizing Your Drinking Or Drug Use?: Yes Have You Felt Bad Or Guilty About Your Drinking Or Drug Use?: No Have You Ever Had a Drink or Used Drugs First Thing In The Morning to Steady Your Nerves or to Get Rid of a Hangover?: Yes CAGE-AID Score: 3  Substance Abuse Education Offered: Yes  Substance abuse interventions: Scientist, clinical (histocompatibility and immunogenetics)

## 2021-03-24 NOTE — Progress Notes (Signed)
D/C instructions given and reviewed. Tele and IV removed, tolerated well. Awaiting TOC meds.

## 2021-03-24 NOTE — Plan of Care (Signed)
  Problem: Education: Goal: Ability to demonstrate management of disease process will improve Outcome: Progressing Goal: Ability to verbalize understanding of medication therapies will improve Outcome: Progressing Goal: Individualized Educational Video(s) Outcome: Progressing   Problem: Activity: Goal: Capacity to carry out activities will improve Outcome: Progressing   Problem: Cardiac: Goal: Ability to achieve and maintain adequate cardiopulmonary perfusion will improve Outcome: Progressing   Problem: Education: Goal: Knowledge of disease or condition will improve Outcome: Progressing Goal: Understanding of medication regimen will improve Outcome: Progressing Goal: Individualized Educational Video(s) Outcome: Progressing

## 2021-03-24 NOTE — Discharge Instructions (Signed)
You have an appointment set up with the [facility].  Multiple studies have shown that being followed by a dedicated atrial fibrillation clinic in addition to the standard care you receive from your other physicians improves health. We believe that enrollment in the atrial fibrillation clinic will allow [place] to better care for you.   The phone number to the [facility] is [phone]. The clinic is staffed Monday through Friday from 8:30am to 5pm.  Parking Directions: The clinic is located in the Heart and Vascular Building connected to [facility]. 1)From [address] turn on to [address] and go to the 3rd entrance  (Heart and Vascular entrance) on the right. 2)Look to the right for Heart &Vascular Parking Garage. 3)A code for the entrance is required, for June is 4233.   4)Take the elevators to the 1st floor. Registration is in the room with the glass walls at the end of the hallway.  If you have any trouble parking or locating the clinic, please don't hesitate to call [phone].

## 2021-03-24 NOTE — Discharge Summary (Signed)
Physician Discharge Summary  Adam [name] QGB:[id number] DOB: 1951-03-26 DOA: 03/20/2021  PCP: [name], MD  Admit date: 03/20/2021 Discharge date: 03/24/2021  Admitted From: Home Disposition:  Home  Recommendations for Outpatient Follow-up:  Follow up with PCP in 1 week Follow up with cardiology, A. fib clinic  Discharge Condition: Stable CODE STATUS: Full code Diet recommendation: Heart healthy diet  Brief/Interim Summary: Adam [name] is a 70 y.o. male with medical history significant of chronic A. fib on Tikosyn and Eliquis, CAD status post stenting, IDDM, HTN, HLD, came with palpitations.   Patient has not been able to take Cardizem for last 3 days due to insurance issue.  And this morning he started to have palpitations and was found heart rate in the 150s.  Denies any chest pain, no shortness of breath.  He is a chronic alcohol drinker, last drink was this morning with a half a glass of wine.   Three days ago, patient developed acute onset of left-sided facial droop, and drooling and slurred speech, went to see PCP who ordered a brain MRI which showed negative for acute stroke.  Denies any weakness or numbness of any of the limbs, no hearing changes no blurry vision.   ED Course: Heart rate in the 140s, EKG showed uncontrolled A. fib, blood pressure stable, no hypoxia chest x-ray clean.  Cardiology consulted, recommend Cardizem drip and admission.    Hospitalization complicated by alcohol withdrawal. He required IV ativan, sitter at bedside, wrist restraints.  Alcohol withdrawal improved during hospital stay.  Patient underwent cardioversion on 6/20 with conversion to normal sinus rhythm.  Discharge Diagnoses:  Principal Problem:   Atrial fibrillation with rapid ventricular response (HCC) Active Problems:   CAD (coronary artery disease)   Hyperlipidemia associated with type 2 diabetes mellitus (HCC)   Alcohol abuse   Benign essential hypertension   A. fib  RVR -Remains on Eliquis, amiodarone, metoprolol -Cardiology following; follow up in A Fib clinic  -Status post cardioversion 6/20   [name] -Started on steroid, valacyclovir 1 week   Hypertension -Continue metoprolol  Alcohol abuse -Out of withdrawal now.  Cessation counseling.  Insulin-dependent diabetes -Continue Lantus, sliding scale insulin, Jardiance, Tradjenta   Mood disorder -Continue Depakote, Cymbalta   Obesity -Estimated body mass index is 34.44 kg/m as calculated from the following:   Height as of this encounter: 5' 10" (1.778 m).   Weight as of this encounter: 108.9 kg.  Leukocytosis -Likely in setting of steroid use   Discharge Instructions  Discharge Instructions     Amb referral to AFIB Clinic   Complete by: As directed    Call MD for:  difficulty breathing, headache or visual disturbances   Complete by: As directed    Call MD for:  extreme fatigue   Complete by: As directed    Call MD for:  persistant dizziness or light-headedness   Complete by: As directed    Call MD for:  persistant nausea and vomiting   Complete by: As directed    Call MD for:  severe uncontrolled pain   Complete by: As directed    Call MD for:  temperature >100.4   Complete by: As directed    Diet - low sodium heart healthy   Complete by: As directed    Discharge instructions   Complete by: As directed    You were cared for by a hospitalist during your hospital stay. If you have any questions about your discharge medications or the  care you received while you were in the hospital after you are discharged, you can call the unit and ask to speak with the hospitalist on call if the hospitalist that took care of you is not available. Once you are discharged, your primary care physician will handle any further medical issues. Please note that NO REFILLS for any discharge medications will be authorized once you are discharged, as it is imperative that you return to your primary  care physician (or establish a relationship with a primary care physician if you do not have one) for your aftercare needs so that they can reassess your need for medications and monitor your lab values.   Increase activity slowly   Complete by: As directed       Allergies as of 03/24/2021       Reactions   Xarelto [rivaroxaban] Other (See Comments)   Patient stated he "ended up in the hospital with a rectal bleed"   Amoxicillin Rash, Other (See Comments)   * SEVERE RASH IN GROIN AREA Has patient had a PCN reaction causing immediate rash, facial/tongue/throat swelling, SOB or lightheadedness with hypotension: no Has patient had a PCN reaction causing severe rash involving mucus membranes or skin necrosis: no Has patient had a PCN reaction that required hospitalization: no Has patient had a PCN reaction occurring within the last 10 years: yes If all of the above answers are "NO", then may proceed with Cephalosporin use.   Augmentin [amoxicillin-pot Clavulanate] Rash, Other (See Comments)   * SEVERE RASH IN GROIN AREA   Azithromycin Rash, Other (See Comments)   * SEVERE RASH IN GROIN AREA   Clindamycin/lincomycin Rash   Keflex [cephalexin] Rash        Medication List     STOP taking these medications    diltiazem 360 MG 24 hr capsule Commonly known as: CARDIZEM CD   dofetilide 125 MCG capsule Commonly known as: TIKOSYN   losartan 100 MG tablet Commonly known as: COZAAR   potassium chloride SA 20 MEQ tablet Commonly known as: KLOR-CON   PSYLLIUM HUSK PO       TAKE these medications    acetaminophen 325 MG tablet Commonly known as: TYLENOL Take 325 mg by mouth every 8 (eight) hours as needed for moderate pain.   allopurinol 300 MG tablet Commonly known as: ZYLOPRIM TAKE 1 TABLET(300 MG) BY MOUTH DAILY What changed: See the new instructions.   amiodarone 400 MG tablet Commonly known as: PACERONE Take 1 tablet (400 mg total) by mouth 2 (two) times daily for 14  days.   B COMPLEX-C-FOLIC ACID PO Take 1 tablet by mouth daily.   divalproex 125 MG capsule Commonly known as: DEPAKOTE SPRINKLE Take 1 capsule (125 mg total) by mouth 3 (three) times daily. What changed: when to take this   DULoxetine 60 MG capsule Commonly known as: CYMBALTA TAKE 1 CAPSULE(60 MG) BY MOUTH DAILY   Eliquis 5 MG Tabs tablet Generic drug: apixaban TAKE 1 TABLET(5 MG) BY MOUTH TWICE DAILY   folic acid 1 MG tablet Commonly known as: FOLVITE Take 1 tablet (1 mg total) by mouth daily. Start taking on: March 25, 2021   gabapentin 300 MG capsule Commonly known as: Neurontin Take 2 capsules at night as needed for restless leg symptoms What changed:  how much to take how to take this when to take this additional instructions   icosapent Ethyl 1 g capsule Commonly known as: Vascepa TAKE 2 CAPSULES(2 GRAMS) BY MOUTH TWICE DAILY What  changed: additional instructions   Januvia 100 MG tablet Generic drug: sitaGLIPtin TAKE 1 TABLET(100 MG) BY MOUTH DAILY   Jardiance 25 MG Tabs tablet Generic drug: empagliflozin TAKE 1 TABLET(25 MG) BY MOUTH DAILY BEFORE AND BREAKFAST What changed: See the new instructions.   Lidocaine HCl 4 % Crea Apply 1 application topically daily as needed (pain).   Magnesium Oxide 400 MG Caps Take 1 capsule (400 mg total) by mouth daily.   metFORMIN 500 MG 24 hr tablet Commonly known as: GLUCOPHAGE-XR Take 1 tablet (500 mg total) by mouth in the morning and at bedtime.   metoprolol tartrate 50 MG tablet Commonly known as: LOPRESSOR Take 1 tablet (50 mg total) by mouth 3 (three) times daily. What changed: when to take this   montelukast 10 MG tablet Commonly known as: SINGULAIR TAKE 1 TABLET BY MOUTH AT BEDTIME   multivitamin with minerals Tabs tablet Take 1 tablet by mouth daily.   Novofine Pen Needle 32G X 6 MM Misc Generic drug: Insulin Pen Needle Use 1-4 times daily as needed for insulin   onetouch ultrasoft lancets Use  1-4 times daily as needed/directed  DX E11.9   OneTouch Verio test strip Generic drug: glucose blood Use 1-4 times daily as directed/needed   DX E11.9   OneTouch Verio w/Device Kit Use 1-4 times daily as needed/directed DX E11.9   predniSONE 20 MG tablet Commonly known as: DELTASONE Take 3 tablets (60 mg total) by mouth daily with breakfast for 2 days. Start taking on: March 25, 2021   rosuvastatin 40 MG tablet Commonly known as: CRESTOR Take 40 mg by mouth daily. What changed: Another medication with the same name was removed. Continue taking this medication, and follow the directions you see here.   thiamine 100 MG tablet Commonly known as: Vitamin B-1 Take 500 mg by mouth daily.   Toujeo Max SoloStar 300 UNIT/ML Solostar Pen Generic drug: insulin glargine (2 Unit Dial) Inject 130 Units into the skin daily.   Trelegy Ellipta 100-62.5-25 MCG/INH Aepb Generic drug: Fluticasone-Umeclidin-Vilant Inhale 1 puff into the lungs daily.   valACYclovir 1000 MG tablet Commonly known as: VALTREX Take 1 tablet (1,000 mg total) by mouth 3 (three) times daily for 2 days.        Follow-up Information     [name], Alinda Sierras, MD. Schedule an appointment as soon as possible for a visit in 1 week(s).   Specialty: Family Medicine Contact information: [place] [address] [phone]         Sueanne Margarita, MD .   Specialty: Cardiology Contact information: [phone] [place] [address] [place] Follow up.   Specialty: Cardiology Why: 03/31/21 @ 9:00AM with Maximino Greenland, NP Contact information: [address] [phone] [place] [address] [phone]               Allergies  Allergen Reactions   Xarelto [Rivaroxaban] Other (See Comments)    Patient stated he "ended up in the hospital with a rectal bleed"   Amoxicillin Rash and Other (See  Comments)    * SEVERE RASH IN GROIN AREA Has patient had a PCN reaction causing immediate rash, facial/tongue/throat swelling, SOB or lightheadedness with hypotension: no Has patient had a PCN reaction causing severe rash involving mucus membranes or skin necrosis: no Has patient had a PCN reaction that required hospitalization: no Has patient had a  PCN reaction occurring within the last 10 years: yes If all of the above answers are "NO", then may proceed with Cephalosporin use.    Augmentin [Amoxicillin-Pot Clavulanate] Rash and Other (See Comments)    * SEVERE RASH IN GROIN AREA   Azithromycin Rash and Other (See Comments)    * SEVERE RASH IN GROIN AREA   Clindamycin/Lincomycin Rash   Keflex [Cephalexin] Rash    Consultations: Cardiology/EP   Procedures/Studies: CT HEAD WO CONTRAST  Result Date: 03/16/2021 CLINICAL DATA:  Neuro deficit, acute stroke suspected. EXAM: CT HEAD WITHOUT CONTRAST TECHNIQUE: Contiguous axial images were obtained from the base of the skull through the vertex without intravenous contrast. COMPARISON:  CT Feb 08, 2020. MRI Feb 10, 2020. FINDINGS: Brain: No evidence of acute large vascular territory infarction, acute hemorrhage, extra-axial collection or mass lesion/mass effect. Similar generalized atrophy. Similar ventriculomegaly with somewhat acute callosal angle. 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 No significant mass effect. Vascular: No hyperdense vessel identified. Calcific intracranial atherosclerosis. Skull: No acute fracture.  No acute orbital abnormality. Sinuses/Orbits: Mild bilateral maxillary sinus mucosal thickening with air-fluid level on the left. Other: No mastoid effusions. IMPRESSION: 1. No evidence of acute large vascular territory infarct or acute hemorrhage. MRI could provide more sensitive evaluation for acute infarct if clinically indicated. 2. Similar ventriculomegaly. As mentioned on the  prior MRI, this finding is likely in part related to global parenchymal atrophy, although normal pressure hydrocephalus could be contributory in the correct clinical setting. 3. Mild bilateral maxillary sinus mucosal thickening with air-fluid level on the left. Electronically Signed   By: Margaretha Sheffield MD   On: 03/16/2021 12:01   MR Brain Wo Contrast (neuro protocol)  Result Date: 03/16/2021 CLINICAL DATA:  Neuro deficit, acute, stroke suspected. Additional history provided: Slurred speech, increased lethargy for 5 days. Generalized weakness. EXAM: MRI HEAD WITHOUT CONTRAST TECHNIQUE: Multiplanar, multiecho pulse sequences of the brain and surrounding structures were obtained without intravenous contrast. COMPARISON:  Prior head CT examinations 03/16/2021 and earlier. FINDINGS: Brain: Mild intermittent motion degradation. Moderate cerebral and cerebellar atrophy, stable as compared to the prior brain MRI of 02/10/2020. Unchanged ventriculomegaly. This is favored secondary to global atrophy. However, as before a component of normal pressure hydrocephalus is difficult to exclude. Mild multifocal T2/FLAIR hyperintensity within the cerebral white matter, nonspecific but compatible chronic small vessel ischemic disease. To a lesser degree, chronic small vessel ischemic changes are also present within the pons. There is no acute infarct. No evidence of intracranial mass. No chronic intracranial blood products. No extra-axial fluid collection. No midline shift. Vascular: Expected proximal arterial flow voids. Skull and upper cervical spine: No focal marrow lesion. Sinuses/Orbits: Visualized orbits show no acute finding. Mild mucosal thickening and small fluid level within the left maxillary sinus. Mild bilateral ethmoid sinus mucosal thickening. IMPRESSION: No evidence of acute infarction. Moderate generalized parenchymal atrophy, stable as compared to the brain MRI of 02/10/2020. Ventriculomegaly is also unchanged  and favored secondary to global atrophy. However, as before a component of normal pressure hydrocephalus is difficult to exclude and clinical correlation is recommended. Stable mild chronic small vessel ischemic changes within the cerebral white matter and pons. Paranasal sinus disease, as described. Electronically Signed   By: Kellie Simmering DO   On: 03/16/2021 15:01   DG Chest Port 1 View  Result Date: 03/20/2021 CLINICAL DATA:  Shortness of breath EXAM: PORTABLE CHEST 1 VIEW COMPARISON:  03/16/2021 FINDINGS: Low lung volumes. Improved aeration  at lung bases. No pleural effusion. No pneumothorax. Stable cardiomediastinal contours. IMPRESSION: Improved aeration at the lung bases. Electronically Signed   By: Macy Mis M.D.   On: 03/20/2021 14:24   DG Chest Port 1 View  Result Date: 03/16/2021 CLINICAL DATA:  Atrial fibrillation, shortness of breath for several days, history COPD, coronary artery disease, diabetes mellitus, hypertension EXAM: PORTABLE CHEST 1 VIEW COMPARISON:  Portable exam 1128 hours compared to 02/20/2020 FINDINGS: Loop recorder projects over lower over LEFT chest. Enlargement of cardiac silhouette post median sternotomy. Atherosclerotic calcification aorta. Pulmonary vascularity normal. New bibasilar interstitial opacities which could reflect atelectasis or infiltrate. Upper lungs clear. No pleural effusion or pneumothorax. Osseous structures unremarkable. IMPRESSION: Enlargement of cardiac silhouette post CABG. New bibasilar opacities question atelectasis versus infiltrate. Aortic Atherosclerosis (ICD10-I70.0). Electronically Signed   By: Lavonia Dana M.D.   On: 03/16/2021 11:43   ECHO TEE  Result Date: 03/23/2021    TRANSESOPHOGEAL ECHO REPORT   Patient Name:   TIRRELL BUCHBERGER Date of Exam: 03/23/2021 Medical Rec #:  [id number]        Height:       70.0 in Accession #:    [id number]       Weight:       240.0 lb Date of Birth:  Sep 18, 1951        BSA:          2.255 m Patient Age:     1 years         BP:           124/98 mmHg Patient Gender: M                HR:           122 bpm. Exam Location:  Inpatient Procedure: Transesophageal Echo, Color Doppler and Cardiac Doppler Indications:     I48.91* Unspecified atrial fibrillation  History:         Patient has prior history of Echocardiogram examinations, most                  recent 02/11/2020. CAD, COPD; Risk Factors:Hypertension,                  Diabetes and Dyslipidemia.  Sonographer:     Raquel Sarna Senior RDCS Referring Phys:  [id number] [place] Diagnosing Phys: [name] MD PROCEDURE: After discussion of the risks and benefits of a TEE, an informed consent was obtained from the patient. The transesophogeal probe was passed without difficulty through the esophogus of the patient. Sedation performed by different physician. The patient was monitored while under deep sedation. Anesthestetic sedation was provided intravenously by Anesthesiology: 227m of Propofol, 1019mof Lidocaine. The patient developed no complications during the procedure. A successful direct current cardioversion was performed. IMPRESSIONS  1. Left ventricular ejection fraction, by estimation, is 45 to 50%. The left ventricle has mildly decreased function.  2. Right ventricular systolic function is normal. The right ventricular size is not well visualized.  3. No left atrial/left atrial appendage thrombus was detected.  4. The mitral valve is grossly normal. Mild to moderate mitral valve regurgitation.  5. The aortic valve is normal in structure. Aortic valve regurgitation is not visualized.  6. Evidence of atrial level shunting detected by color flow Doppler. FINDINGS  Left Ventricle: Left ventricular ejection fraction, by estimation, is 45 to 50%. The left ventricle has mildly decreased function. The left ventricular internal cavity size was normal in size. Right Ventricle: The  right ventricular size is not well visualized. Right vetricular wall thickness was not well  visualized. Right ventricular systolic function is normal. Left Atrium: Left atrial size was normal in size. No left atrial/left atrial appendage thrombus was detected. Right Atrium: Right atrial size was normal in size. Pericardium: There is no evidence of pericardial effusion. Mitral Valve: The mitral valve is grossly normal. Mild to moderate mitral valve regurgitation. Tricuspid Valve: The tricuspid valve is grossly normal. Tricuspid valve regurgitation is mild. Aortic Valve: The aortic valve is normal in structure. Aortic valve regurgitation is not visualized. Pulmonic Valve: The pulmonic valve was not well visualized. Pulmonic valve regurgitation is not visualized. Aorta: The aortic root and ascending aorta are structurally normal, with no evidence of dilitation. IAS/Shunts: Evidence of atrial level shunting detected by color flow Doppler. There is evidence of left to right shunting across a PFO by color doppler. [name] MD Electronically signed by [name] MD Signature Date/Time: 03/23/2021/4:11:25 PM    Final    CUP PACEART REMOTE DEVICE CHECK  Result Date: 03/05/2021 ILR summary report received. Battery status OK. Normal device function. No new symptom, tachy, brady, or pause episodes. Three new AF episodes, on [place] according to previous reports, AF burden is 9.4% of the time. The longest episode was 44 minutes.  Presenting rhythm is fast at 146 bpm.  Sent to triage.    Monthly summary reports and ROV/PRN Kathy Breach, RN, CCDS, [employer] ILR summary report received. Battery status OK. Normal device function. No new symptom, tachy, brady, or pause episodes. Three new AF episodes, on [place] according to previous reports, AF burden is 9.4% of the time. The longest episode was 44 minutes.  Presenting rhythm is fast at 146 bpm.  Sent to triage.    Monthly summary reports and ROV/PRN Kathy Breach, RN, CCDS, [employer]      Discharge Exam: Vitals:   03/24/21 0734 03/24/21 1140   BP: 116/75 116/81  Pulse: 77 79  Resp: 19 19  Temp: 97.7 F (36.5 C) (!) 97.5 F (36.4 C)  SpO2: 97% 94%    General: Pt is alert, awake, not in acute distress Cardiovascular: RRR, S1/S2 +, no edema Respiratory: CTA bilaterally, no wheezing, no rhonchi, no respiratory distress, no conversational dyspnea  Abdominal: Soft, NT, ND, bowel sounds + Extremities: no edema, no cyanosis Psych: Normal mood and affect, stable judgement and insight     The results of significant diagnostics from this hospitalization (including imaging, microbiology, ancillary and laboratory) are listed below for reference.     Microbiology: Recent Results (from the past 240 hour(s))  Resp Panel by RT-PCR (Flu A&B, Covid) Nasopharyngeal Swab     Status: None   Collection Time: 03/16/21 11:25 AM   Specimen: Nasopharyngeal Swab; Nasopharyngeal(NP) swabs in vial transport medium  Result Value Ref Range Status   SARS Coronavirus 2 by RT PCR NEGATIVE NEGATIVE Final    Comment: (NOTE) SARS-CoV-2 target nucleic acids are NOT DETECTED.  The SARS-CoV-2 RNA is generally detectable in upper respiratory specimens during the acute phase of infection. The lowest concentration of SARS-CoV-2 viral copies this assay can detect is 138 copies/mL. A negative result does not preclude SARS-Cov-2 infection and should not be used as the sole basis for treatment or other patient management decisions. A negative result may occur with  improper specimen collection/handling, submission of specimen other than nasopharyngeal swab, presence of viral mutation(s) within the areas targeted by this assay, and inadequate number of  viral copies(<138 copies/mL). A negative result must be combined with clinical observations, patient history, and epidemiological information. The expected result is Negative.  Fact Sheet for Patients:  EntrepreneurPulse.com.au  Fact Sheet for Healthcare Providers:   IncredibleEmployment.be  This test is no t yet approved or cleared by the [place] FDA and  has been authorized for detection and/or diagnosis of SARS-CoV-2 by FDA under an Emergency Use Authorization (EUA). This EUA will remain  in effect (meaning this test can be used) for the duration of the COVID-19 declaration under Section 564(b)(1) of the Act, 21 U.S.C.section 360bbb-3(b)(1), unless the authorization is terminated  or revoked sooner.       Influenza A by PCR NEGATIVE NEGATIVE Final   Influenza B by PCR NEGATIVE NEGATIVE Final    Comment: (NOTE) The Xpert Xpress SARS-CoV-2/FLU/RSV plus assay is intended as an aid in the diagnosis of influenza from Nasopharyngeal swab specimens and should not be used as a sole basis for treatment. Nasal washings and aspirates are unacceptable for Xpert Xpress SARS-CoV-2/FLU/RSV testing.  Fact Sheet for Patients: EntrepreneurPulse.com.au  Fact Sheet for Healthcare Providers: IncredibleEmployment.be  This test is not yet approved or cleared by the [place] FDA and has been authorized for detection and/or diagnosis of SARS-CoV-2 by FDA under an Emergency Use Authorization (EUA). This EUA will remain in effect (meaning this test can be used) for the duration of the COVID-19 declaration under Section 564(b)(1) of the Act, 21 U.S.C. section 360bbb-3(b)(1), unless the authorization is terminated or revoked.  Performed at [facility], [place] [address], [place], [place] [address]   SARS CORONAVIRUS 2 (TAT 6-24 HRS) Nasopharyngeal Nasopharyngeal Swab     Status: None   Collection Time: 03/20/21  2:55 PM   Specimen: Nasopharyngeal Swab  Result Value Ref Range Status   SARS Coronavirus 2 NEGATIVE NEGATIVE Final    Comment: (NOTE) SARS-CoV-2 target nucleic acids are NOT DETECTED.  The SARS-CoV-2 RNA is generally detectable in upper and lower respiratory specimens during the  acute phase of infection. Negative results do not preclude SARS-CoV-2 infection, do not rule out co-infections with other pathogens, and should not be used as the sole basis for treatment or other patient management decisions. Negative results must be combined with clinical observations, patient history, and epidemiological information. The expected result is Negative.  Fact Sheet for Patients: SugarRoll.be  Fact Sheet for Healthcare Providers: https://www.woods-mathews.com/  This test is not yet approved or cleared by the [place] FDA and  has been authorized for detection and/or diagnosis of SARS-CoV-2 by FDA under an Emergency Use Authorization (EUA). This EUA will remain  in effect (meaning this test can be used) for the duration of the COVID-19 declaration under Se ction 564(b)(1) of the Act, 21 U.S.C. section 360bbb-3(b)(1), unless the authorization is terminated or revoked sooner.  Performed at [facility], [place] [address] [place], [place] [address]      Labs: BNP (last 3 results) Recent Labs    03/20/21 1317  BNP 035.0*   Basic Metabolic Panel: Recent Labs  Lab 03/20/21 1317 03/20/21 1650 03/21/21 0118 03/22/21 0329 03/23/21 0957 03/24/21 0236  NA 141  --  139 137 139 139  K 3.8  --  3.9 3.4* 3.5 4.3  CL 109  --  104 100 106 104  CO2 23  --  25 21* 25 25  GLUCOSE 119*  --  104* 105* 120* 140*  BUN 9  --  _0 28*  CREATININE 0.77  --  0.80 0.86 0.98 1.08  CALCIUM 9.5  --  9.4 10.1 9.7 10.0  MG  --  1.9  --   --  2.4 2.5*  PHOS  --  3.2  --   --   --   --    Liver Function Tests: No results for input(s): AST, ALT, ALKPHOS, BILITOT, PROT, ALBUMIN in the last 168 hours. No results for input(s): LIPASE, AMYLASE in the last 168 hours. No results for input(s): AMMONIA in the last 168 hours. CBC: Recent Labs  Lab 03/20/21 1317 03/22/21 0329 03/23/21 0957 03/24/21 0236  WBC 6.9 11.3* 12.7* 14.5*   HGB 13.0 13.2 13.3 12.6*  HCT 39.3 39.6 40.4 38.6*  MCV 95.6 94.7 95.7 96.7  PLT 184 215 229 249   Cardiac Enzymes: No results for input(s): CKTOTAL, CKMB, CKMBINDEX, TROPONINI in the last 168 hours. BNP: Invalid input(s): POCBNP CBG: Recent Labs  Lab 03/23/21 1103 03/23/21 1607 03/23/21 2146 03/24/21 0553 03/24/21 1142  GLUCAP 124* 182* 183* 127* 151*   D-Dimer No results for input(s): DDIMER in the last 72 hours. Hgb A1c No results for input(s): HGBA1C in the last 72 hours. Lipid Profile No results for input(s): CHOL, HDL, LDLCALC, TRIG, CHOLHDL, LDLDIRECT in the last 72 hours. Thyroid function studies No results for input(s): TSH, T4TOTAL, T3FREE, THYROIDAB in the last 72 hours.  Invalid input(s): FREET3 Anemia work up No results for input(s): VITAMINB12, FOLATE, FERRITIN, TIBC, IRON, RETICCTPCT in the last 72 hours. Urinalysis    Component Value Date/Time   COLORURINE AMBER (A) 02/20/2020 1957   APPEARANCEUR HAZY (A) 02/20/2020 1957   LABSPEC 1.020 02/20/2020 1957   PHURINE 5.0 02/20/2020 1957   GLUCOSEU 50 (A) 02/20/2020 1957   HGBUR NEGATIVE 02/20/2020 1957   BILIRUBINUR NEGATIVE 02/20/2020 1957   BILIRUBINUR 1+ 08/14/2013 1200   KETONESUR NEGATIVE 02/20/2020 1957   PROTEINUR 30 (A) 02/20/2020 1957   UROBILINOGEN 0.2 08/14/2013 1200   NITRITE NEGATIVE 02/20/2020 1957   LEUKOCYTESUR TRACE (A) 02/20/2020 1957   Sepsis Labs Invalid input(s): PROCALCITONIN,  WBC,  LACTICIDVEN Microbiology Recent Results (from the past 240 hour(s))  Resp Panel by RT-PCR (Flu A&B, Covid) Nasopharyngeal Swab     Status: None   Collection Time: 03/16/21 11:25 AM   Specimen: Nasopharyngeal Swab; Nasopharyngeal(NP) swabs in vial transport medium  Result Value Ref Range Status   SARS Coronavirus 2 by RT PCR NEGATIVE NEGATIVE Final    Comment: (NOTE) SARS-CoV-2 target nucleic acids are NOT DETECTED.  The SARS-CoV-2 RNA is generally detectable in upper respiratory specimens  during the acute phase of infection. The lowest concentration of SARS-CoV-2 viral copies this assay can detect is 138 copies/mL. A negative result does not preclude SARS-Cov-2 infection and should not be used as the sole basis for treatment or other patient management decisions. A negative result may occur with  improper specimen collection/handling, submission of specimen other than nasopharyngeal swab, presence of viral mutation(s) within the areas targeted by this assay, and inadequate number of viral copies(<138 copies/mL). A negative result must be combined with clinical observations, patient history, and epidemiological information. The expected result is Negative.  Fact Sheet for Patients:  EntrepreneurPulse.com.au  Fact Sheet for Healthcare Providers:  IncredibleEmployment.be  This test is no t yet approved or cleared by the [place] FDA and  has been authorized for detection and/or diagnosis of SARS-CoV-2 by FDA under an Emergency Use Authorization (EUA). This EUA will remain  in effect (meaning this test  can be used) for the duration of the COVID-19 declaration under Section 564(b)(1) of the Act, 21 U.S.C.section 360bbb-3(b)(1), unless the authorization is terminated  or revoked sooner.       Influenza A by PCR NEGATIVE NEGATIVE Final   Influenza B by PCR NEGATIVE NEGATIVE Final    Comment: (NOTE) The Xpert Xpress SARS-CoV-2/FLU/RSV plus assay is intended as an aid in the diagnosis of influenza from Nasopharyngeal swab specimens and should not be used as a sole basis for treatment. Nasal washings and aspirates are unacceptable for Xpert Xpress SARS-CoV-2/FLU/RSV testing.  Fact Sheet for Patients: EntrepreneurPulse.com.au  Fact Sheet for Healthcare Providers: IncredibleEmployment.be  This test is not yet approved or cleared by the [place] FDA and has been authorized for detection  and/or diagnosis of SARS-CoV-2 by FDA under an Emergency Use Authorization (EUA). This EUA will remain in effect (meaning this test can be used) for the duration of the COVID-19 declaration under Section 564(b)(1) of the Act, 21 U.S.C. section 360bbb-3(b)(1), unless the authorization is terminated or revoked.  Performed at [facility], [place] [address] [place], [place] [address]   SARS CORONAVIRUS 2 (TAT 6-24 HRS) Nasopharyngeal Nasopharyngeal Swab     Status: None   Collection Time: 03/20/21  2:55 PM   Specimen: Nasopharyngeal Swab  Result Value Ref Range Status   SARS Coronavirus 2 NEGATIVE NEGATIVE Final    Comment: (NOTE) SARS-CoV-2 target nucleic acids are NOT DETECTED.  The SARS-CoV-2 RNA is generally detectable in upper and lower respiratory specimens during the acute phase of infection. Negative results do not preclude SARS-CoV-2 infection, do not rule out co-infections with other pathogens, and should not be used as the sole basis for treatment or other patient management decisions. Negative results must be combined with clinical observations, patient history, and epidemiological information. The expected result is Negative.  Fact Sheet for Patients: SugarRoll.be  Fact Sheet for Healthcare Providers: https://www.woods-mathews.com/  This test is not yet approved or cleared by the [place] FDA and  has been authorized for detection and/or diagnosis of SARS-CoV-2 by FDA under an Emergency Use Authorization (EUA). This EUA will remain  in effect (meaning this test can be used) for the duration of the COVID-19 declaration under Se ction 564(b)(1) of the Act, 21 U.S.C. section 360bbb-3(b)(1), unless the authorization is terminated or revoked sooner.  Performed at [facility], [place] [address], [place], [place] [address]      Patient was seen and examined on the day of discharge and was found to be in stable  condition. Time coordinating discharge: 25 minutes including assessment and coordination of care, as well as examination of the patient.   SIGNED:  Dessa Phi, DO [facility] 03/24/2021, 12:29 PM

## 2021-03-27 NOTE — Progress Notes (Signed)
[facility] Summary Report / Loop Recorder 

## 2021-03-31 ENCOUNTER — Other Ambulatory Visit: Payer: Self-pay

## 2021-03-31 ENCOUNTER — Ambulatory Visit (HOSPITAL_COMMUNITY)
Admit: 2021-03-31 | Discharge: 2021-03-31 | Disposition: A | Discharge status: Home / Self Care | Payer: Medicare Other | Attending: Nurse Practitioner | Admitting: Nurse Practitioner

## 2021-03-31 ENCOUNTER — Encounter (HOSPITAL_COMMUNITY): Payer: Self-pay | Admitting: Nurse Practitioner

## 2021-03-31 VITALS — BP 106/62 | HR 72 | Ht 70.0 in | Wt 241.8 lb

## 2021-03-31 DIAGNOSIS — I10 Essential (primary) hypertension: Secondary | ICD-10-CM | POA: Insufficient documentation

## 2021-03-31 DIAGNOSIS — G4733 Obstructive sleep apnea (adult) (pediatric): Secondary | ICD-10-CM | POA: Insufficient documentation

## 2021-03-31 DIAGNOSIS — Z87891 Personal history of nicotine dependence: Secondary | ICD-10-CM | POA: Diagnosis not present

## 2021-03-31 DIAGNOSIS — I4892 Unspecified atrial flutter: Secondary | ICD-10-CM | POA: Insufficient documentation

## 2021-03-31 DIAGNOSIS — D6869 Other thrombophilia: Secondary | ICD-10-CM

## 2021-03-31 DIAGNOSIS — Z951 Presence of aortocoronary bypass graft: Secondary | ICD-10-CM | POA: Diagnosis not present

## 2021-03-31 DIAGNOSIS — E785 Hyperlipidemia, unspecified: Secondary | ICD-10-CM | POA: Diagnosis not present

## 2021-03-31 DIAGNOSIS — Z6834 Body mass index (BMI) 34.0-34.9, adult: Secondary | ICD-10-CM | POA: Diagnosis not present

## 2021-03-31 DIAGNOSIS — I2510 Atherosclerotic heart disease of native coronary artery without angina pectoris: Secondary | ICD-10-CM | POA: Insufficient documentation

## 2021-03-31 DIAGNOSIS — I4891 Unspecified atrial fibrillation: Secondary | ICD-10-CM

## 2021-03-31 DIAGNOSIS — I4819 Other persistent atrial fibrillation: Secondary | ICD-10-CM | POA: Diagnosis not present

## 2021-03-31 DIAGNOSIS — Z955 Presence of coronary angioplasty implant and graft: Secondary | ICD-10-CM | POA: Diagnosis not present

## 2021-03-31 DIAGNOSIS — Z7951 Long term (current) use of inhaled steroids: Secondary | ICD-10-CM | POA: Diagnosis not present

## 2021-03-31 DIAGNOSIS — Z79899 Other long term (current) drug therapy: Secondary | ICD-10-CM | POA: Insufficient documentation

## 2021-03-31 DIAGNOSIS — E669 Obesity, unspecified: Secondary | ICD-10-CM | POA: Diagnosis not present

## 2021-03-31 DIAGNOSIS — J449 Chronic obstructive pulmonary disease, unspecified: Secondary | ICD-10-CM | POA: Diagnosis not present

## 2021-03-31 NOTE — Progress Notes (Signed)
 Primary Care Physician: [name], Bruce W, MD Referring Physician: Dr. Allred Primary Cardiologist: Dr [name]  Adam [name] is a 70 y.o. male with a h/o  CABG in 2011, done in [place], HTN, HLD, DM, Atrial fibrillation,alcohol use, and OSA. He underwent catheter ablation for atrial fibrillation by Dr. Allred 03/31/18. He was followed by Dr. Turner in sleep clinic, but unable to tolerate CPAP and turned in his equipment. He was previously on Xarelto for a CHADS2VASC score of 3. Patient was admitted 10/24/19 for GI bleeding and alcohol withdrawal. EP was consulted to assist with dofetilide reloading. His Xarelto was held on admission and was not resumed at discharge, he was eventually placed back on eliquis.  He was discharged to rehab for a prolonged period of time for Wernicke's encephalopathy.   He is now back in the afib clinic for pt feeling rapid heart rate.. By his device, he had had 3 episodes of afib.Fastest rate was 146 bpm, with longest duration of 44 mins. Recent burden was actually improved at 9.4%, previously at 14%. Presenting rhythm for the interpretation was in rapid a  Flutter and he continues in this rhythm today. He continues o drink alcohol, when asked the amount, he states "enough". He continues to have untreated OSA.   F/u in afib clinic, 03/31/21. He had a hospitalization for afib with RVR, 6-17 thru 6-21, worsened with not taking cardizem for 3 days as the drug store was out of it per pt. He was having ongoing alcohol abuse. Last alcohol was that am, wine with breakfast. He had some slurred speech, left facial droop that had occurred days earlier, PCP obtained MRI, negative for stroke.EP was consulted doe RVR, cardizem drip started and the decision was made to stop Tikosyn and start amio load. Pt was on amio in  the remote past, had issues with skin discoloration but EP did not see any alternative drug options. Hospitalization was complicated by alcohol withdrawal.   In the office  today, afib is rate controlled. He continues on amiodarone 400 mg bid, x 2 weeks. He states he has missed a few doses of DOAC, reminded not to do this with pending cardioversion. He feels improved.   Today, he denies symptoms of palpitations, chest pain, mild increase of shortness of breath and fatigue,  orthopnea, PND, lower extremity edema, dizziness, presyncope, syncope, or neurologic sequela. The patient is tolerating medications without difficulties and is otherwise without complaint today.   Past Medical History:  Diagnosis Date   Allergy    Anxiety    history of PTSD following CABG   Ascending aortic aneurysm (HCC) 01/31/2018   43 x 42 mm, pt unaware   Asthma    Cardiomegaly 10/17/2017   Colitis- colonoscopy 2014 07/13/2015   COPD (chronic obstructive pulmonary disease) (HCC)    Coronary artery disease    x 6   Depression    Diabetes mellitus without complication (HCC)    Family history of polyps in the colon    Finger dislocation    Left pinkie   GERD (gastroesophageal reflux disease)    Gout    H/O atrial fibrillation without current medication    following CABG with no documented episodes since then.   Heart palpitations    Hx of adenomatous colonic polyps 08/12/2010   Hyperlipidemia    Hypertension    OA (osteoarthritis)    OSA (obstructive sleep apnea)    Mild, has not received CPAP yet   Prediabetes    RLS (  restless legs syndrome)    Squamous cell carcinoma of scalp 2016   Moh's   Past Surgical History:  Procedure Laterality Date   ANKLE FRACTURE SURGERY Right 1991   APPENDECTOMY     ATRIAL FIBRILLATION ABLATION N/A 03/31/2018   Procedure: ATRIAL FIBRILLATION ABLATION;  Surgeon: Allred, James, MD;  Location: [facility];  Service: Cardiovascular;  Laterality: N/A;   CARDIAC ELECTROPHYSIOLOGY MAPPING AND ABLATION     CARDIOVERSION N/A 03/23/2021   Procedure: CARDIOVERSION;  Surgeon: Nahser, Philip J, MD;  Location: [facility];  Service: Cardiovascular;   Laterality: N/A;   COLONOSCOPY W/ BIOPSIES  2017   x7   CORONARY ANGIOPLASTY WITH STENT PLACEMENT     CORONARY ARTERY BYPASS GRAFT     FINGER SURGERY  04/2018   Small finger left hand   implantable loop recorder placement  07/02/2019   Medtronic Reveal Linq model LNQ11 (SN [id number]) implanted in office by Dr Allred   TEE WITHOUT CARDIOVERSION N/A 03/30/2018   Procedure: TRANSESOPHAGEAL ECHOCARDIOGRAM (TEE);  Surgeon: Croitoru, Mihai, MD;  Location: [facility];  Service: Cardiovascular;  Laterality: N/A;   TEE WITHOUT CARDIOVERSION N/A 03/23/2021   Procedure: TRANSESOPHAGEAL ECHOCARDIOGRAM (TEE);  Surgeon: Nahser, Philip J, MD;  Location: [facility];  Service: Cardiovascular;  Laterality: N/A;   TOTAL HIP ARTHROPLASTY Left    TOTAL HIP ARTHROPLASTY Right 09/12/2018   Procedure: TOTAL HIP ARTHROPLASTY ANTERIOR APPROACH;  Surgeon: Olin, Matthew, MD;  Location: [facility];  Service: Orthopedics;  Laterality: Right;  70 mins    Current Outpatient Medications  Medication Sig Dispense Refill   acetaminophen (TYLENOL) 325 MG tablet Take 325 mg by mouth every 8 (eight) hours as needed for moderate pain.     allopurinol (ZYLOPRIM) 300 MG tablet TAKE 1 TABLET(300 MG) BY MOUTH DAILY 90 tablet 3   amiodarone (PACERONE) 200 MG tablet Take 2 tablets (400 mg total) by mouth 2 (two) times daily for 14 days then decrease to 1 tablet (200mg) twice daily 88 tablet 0   B COMPLEX-C-FOLIC ACID PO Take 1 tablet by mouth daily.     Blood Glucose Monitoring Suppl (ONETOUCH VERIO) w/Device KIT Use 1-4 times daily as needed/directed DX E11.9 1 kit 0   DULoxetine (CYMBALTA) 60 MG capsule TAKE 1 CAPSULE(60 MG) BY MOUTH DAILY 90 capsule 1   ELIQUIS 5 MG TABS tablet TAKE 1 TABLET(5 MG) BY MOUTH TWICE DAILY 180 tablet 1   Fluticasone-Umeclidin-Vilant (TRELEGY ELLIPTA) 100-62.5-25 MCG/INH AEPB Inhale 1 puff into the lungs daily. 60 each 3   folic acid (FOLVITE) 1 MG tablet Take 1 tablet (1 mg total) by mouth daily. 30  tablet 2   gabapentin (NEURONTIN) 300 MG capsule Take 2 capsules at night as needed for restless leg symptoms 180 capsule 3   icosapent Ethyl (VASCEPA) 1 g capsule TAKE 2 CAPSULES(2 GRAMS) BY MOUTH TWICE DAILY 180 capsule 3   insulin glargine, 2 Unit Dial, (TOUJEO MAX SOLOSTAR) 300 UNIT/ML Solostar Pen Inject 130 Units into the skin daily. 9 mL 3   Insulin Pen Needle (NOVOFINE PEN NEEDLE) 32G X 6 MM MISC Use 1-4 times daily as needed for insulin 100 each 3   JANUVIA 100 MG tablet TAKE 1 TABLET(100 MG) BY MOUTH DAILY 90 tablet 1   JARDIANCE 25 MG TABS tablet TAKE 1 TABLET(25 MG) BY MOUTH DAILY BEFORE AND BREAKFAST 90 tablet 1   Lancets (ONETOUCH ULTRASOFT) lancets Use 1-4 times daily as needed/directed  DX E11.9 200 each 12   Lidocaine HCl 4 %   CREA Apply 1 application topically daily as needed (pain).     Magnesium Oxide 400 MG CAPS Take 1 capsule (400 mg total) by mouth daily. 90 capsule 1   metFORMIN (GLUCOPHAGE-XR) 500 MG 24 hr tablet Take 1 tablet (500 mg total) by mouth in the morning and at bedtime. 180 tablet 3   metoprolol tartrate (LOPRESSOR) 50 MG tablet Take 1 tablet (50 mg total) by mouth 3 (three) times daily. 90 tablet 0   montelukast (SINGULAIR) 10 MG tablet TAKE 1 TABLET BY MOUTH AT BEDTIME 90 tablet 3   Multiple Vitamin (MULTIVITAMIN WITH MINERALS) TABS tablet Take 1 tablet by mouth daily.     ONETOUCH VERIO test strip Use 1-4 times daily as directed/needed   DX E11.9 200 each 12   rosuvastatin (CRESTOR) 40 MG tablet Take 40 mg by mouth daily.     thiamine (VITAMIN B-1) 100 MG tablet Take 500 mg by mouth daily.     No current facility-administered medications for this encounter.    Allergies  Allergen Reactions   Xarelto [Rivaroxaban] Other (See Comments)    Patient stated he "ended up in the hospital with a rectal bleed"   Amoxicillin Rash and Other (See Comments)    * SEVERE RASH IN GROIN AREA Has patient had a PCN reaction causing immediate rash, facial/tongue/throat  swelling, SOB or lightheadedness with hypotension: no Has patient had a PCN reaction causing severe rash involving mucus membranes or skin necrosis: no Has patient had a PCN reaction that required hospitalization: no Has patient had a PCN reaction occurring within the last 10 years: yes If all of the above answers are "NO", then may proceed with Cephalosporin use.    Augmentin [Amoxicillin-Pot Clavulanate] Rash and Other (See Comments)    * SEVERE RASH IN GROIN AREA   Azithromycin Rash and Other (See Comments)    * SEVERE RASH IN GROIN AREA   Clindamycin/Lincomycin Rash   Keflex [Cephalexin] Rash    Social History   Socioeconomic History   Marital status: Married    Spouse name: Not on file   Number of children: 0   Years of education: Not on file   Highest education level: Not on file  Occupational History   Occupation: retired  Tobacco Use   Smoking status: Former    Packs/day: 1.00    Years: 7.00    Pack years: 7.00    Types: Cigarettes    Quit date: 10/04/1977    Years since quitting: 43.5   Smokeless tobacco: Never   Tobacco comments:    never smoked over 1 pack   Vaping Use   Vaping Use: Never used  Substance and Sexual Activity   Alcohol use: Yes    Alcohol/week: 3.0 - 4.0 standard drinks    Types: 1 Glasses of wine, 2 - 3 Shots of liquor per week    Comment: DAILY   Drug use: No    Comment: Smoked Marijuana back in 1970s   Sexual activity: Yes  Other Topics Concern   Not on file  Social History Narrative   Married, no children   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 - returned to [place] from [place] 2014   Enjoys bird watching, reading historical books about WWII, watching tv   Social Determinants of Health   Financial Resource Strain: Low Risk    Difficulty of Paying Living Expenses: Not hard at all  Food Insecurity: Not on file    Transportation Needs: No Data processing manager (Medical): No    Lack of Transportation (Non-Medical): No  Physical Activity: Inactive   Days of Exercise per Week: 0 days   Minutes of Exercise per Session: 0 min  Stress: No Stress Concern Present   Feeling of Stress : Not at all  Social Connections: Moderately Isolated   Frequency of Communication with Friends and Family: More than three times a week   Frequency of Social Gatherings with Friends and Family: Never   Attends Religious Services: Never   Marine scientist or Organizations: No   Attends Archivist Meetings: Never   Marital Status: Married  Human resources officer Violence: Not At Risk   Fear of Current or Ex-Partner: No   Emotionally Abused: No   Physically Abused: No   Sexually Abused: No    Family History  Problem Relation Age of Onset   Colon cancer Mother    Cancer Father        UNKNOWN TYPE   Heart disease Paternal Grandmother    Heart disease Paternal Grandfather     ROS- All systems are reviewed and negative except as per the HPI above  Physical Exam: Vitals:   03/31/21 0857  BP: 106/62  Pulse: 72  Weight: 109.7 kg  Height: 5' 10" (1.778 m)   Wt Readings from Last 3 Encounters:  03/31/21 109.7 kg  03/24/21 105.9 kg  03/20/21 111 kg    Labs: Lab Results  Component Value Date   NA 139 03/24/2021   K 4.3 03/24/2021   CL 104 03/24/2021   CO2 25 03/24/2021   GLUCOSE 140 (H) 03/24/2021   BUN 28 (H) 03/24/2021   CREATININE 1.08 03/24/2021   CALCIUM 10.0 03/24/2021   PHOS 3.2 03/20/2021   MG 2.5 (H) 03/24/2021   Lab Results  Component Value Date   INR 1.2 03/23/2021   Lab Results  Component Value Date   CHOL 137 09/23/2020   HDL 40 09/23/2020   LDLCALC 64 09/23/2020   TRIG 280 (H) 09/23/2020    GEN- The patient is well appearing obese male, alert and oriented x 3 today.   HEENT-head normocephalic, atraumatic, sclera clear, conjunctiva pink, hearing intact, trachea midline. Lungs- Clear to ausculation bilaterally, normal work of  breathing Heart- controlled irregular rate and rhythm, no murmurs, rubs or gallops  GI- soft, NT, ND, + BS Extremities- no clubbing, cyanosis, or edema MS- no significant deformity or atrophy Skin- no rash or lesion Psych- euthymic mood, full affect Neuro- strength and sensation are intact   EKG- probable atrial flutter at 142 bpm, qrs int 100 bpm, qtc 529 ms    Epic records reviewed   Assessment and Plan: 1. Persistent afib S/p ablation 03/2018. Reloaded on dofetilide 1/21-1/23/21 Increased a flutter burden over  the last several weeks   Now tikosyn has been d/ced by EP and amiodarone load started during recent hospitalization He will reduce drug after 2 weeks from 400 mg bid to 200 mg bid on 67/6/22 Continue metoprolol tartrate 50 mg tid  Increased afib burden is not surprising with ongoing alcohol use and untreated sleep apnea   This patients CHA2DS2-VASc Score and unadjusted Ischemic Stroke Rate (% per year) is equal to 4.8 % stroke rate/year from a score of 4  Reminded to not miss any eliquis 5 mg bid going forward   Above score calculated as 1 point each if present [CHF, HTN, DM, Vascular=MI/PAD/Aortic Plaque, Age if 46-74, or  Male] Above score calculated as 2 points each if present [Age > 75, or Stroke/TIA/TE]   2. OSA The importance of adequate treatment of sleep apnea was discussed today in order to improve our ability to maintain sinus rhythm long term. Patient not on CPAP therapy. Followed by Dr Turner.  3. CAD  No recent issues with chest pain  4. HTN Stable   5. Obesity Body mass index is 34.69 kg/m. Lifestyle modification was discussed and encouraged including regular physical activity and weight reduction. Avoidance of alcohol would be ideal     I will see back in 2 weeks to discuss scheduling cardioversion     C. , ANP-C [facility]  Hospital [address] [place], [place] [address] [phone]      

## 2021-03-31 NOTE — Patient Instructions (Signed)
On July 6th -- reduce Amiodarone to 200mg  twice a day  Do NOT miss any doses of your Eliquis

## 2021-04-01 ENCOUNTER — Ambulatory Visit (INDEPENDENT_AMBULATORY_CARE_PROVIDER_SITE_OTHER): Payer: Medicare Other | Admitting: Family Medicine

## 2021-04-01 VITALS — BP 120/68 | HR 80 | Temp 98.2°F | Wt 243.3 lb

## 2021-04-01 DIAGNOSIS — F1010 Alcohol abuse, uncomplicated: Secondary | ICD-10-CM | POA: Diagnosis not present

## 2021-04-01 DIAGNOSIS — E1142 Type 2 diabetes mellitus with diabetic polyneuropathy: Secondary | ICD-10-CM | POA: Diagnosis not present

## 2021-04-01 DIAGNOSIS — R296 Repeated falls: Secondary | ICD-10-CM | POA: Diagnosis not present

## 2021-04-01 DIAGNOSIS — I4891 Unspecified atrial fibrillation: Secondary | ICD-10-CM

## 2021-04-01 DIAGNOSIS — G510 Bell's palsy: Secondary | ICD-10-CM

## 2021-04-01 NOTE — Patient Instructions (Signed)
Reduce the Toujeo insulin to 100 units daily  Use walker at all times to reduce falls.

## 2021-04-01 NOTE — Progress Notes (Signed)
Established Patient Office Visit  Subjective:  Patient ID: Adam [name], male    DOB: 25-Nov-1950  Age: 70 y.o. MRN: [id number]  CC:  Chief Complaint  Patient presents with   Hospitalization Follow-up    HPI Adam [name] presents for hospital follow-up.  He has longstanding history of atrial fibrillation and had presented here on 17 June with tachycardia with rate around 146.  He was not aware that his rate was up that high.  He apparently had glitch with insurance coverage with Cardizem and had been out of that medication for about 3 days prior to when we saw him.  He also had recent ER visit for some slurred speech.  Multiple recent notes indicated that he was sent for MRI by PCP but he actually had MRI done when he was in the ED as per notes of 03-16-2021.  No evidence for stroke.  Concern for possible [name] by neurology recent hospitalization.  Patient was sent from our office on the 17th for admission secondary to A. fib with rapid ventricular response.  He is on Tikosyn and Eliquis at the time of admission.  He was switched over to amiodarone and currently in titration for that.  Followed by A. fib clinic.  Still drinking alcohol regularly and excessively.  Patient had Cardizem drip on admission and cardiology consulted.  He apparently had some alcohol withdrawal treated with IV Ativan.  Underwent cardioversion 6/20 with conversion to sinus rhythm.  Recent thyroid functions normal.  Patient also has obstructive sleep apnea and has refused CPAP in the past.  Type 2 diabetes on high-dose insulin.  Recent hospital A1c 5.7%.  Was on prednisone recently.  Not monitoring blood sugars regularly.  Did have episode yesterday of possible hypoglycemia.  Had some sweats and mild tremor without any chest pains.  High risk for hypoglycemia with his alcohol abuse and high-dose insulin.  Continues to have major balance issues and wife states he is fallen multiple times.  He has history of  neuropathy.  He has home walker which she encourages him to use but he refuses.  He reportedly fell and hit his head even last night but has no confusion today and no headaches and no new symptoms.  Past Medical History:  Diagnosis Date   Allergy    Anxiety    history of PTSD following CABG   Ascending aortic aneurysm ([place]) 01/31/2018   43 x 42 mm, pt unaware   Asthma    Cardiomegaly 10/17/2017   Colitis- colonoscopy 2014 07/13/2015   COPD (chronic obstructive pulmonary disease) ([place])    Coronary artery disease    x 6   Depression    Diabetes mellitus without complication ([place])    Family history of polyps in the colon    Finger dislocation    Left pinkie   GERD (gastroesophageal reflux disease)    Gout    H/O atrial fibrillation without current medication    following CABG with no documented episodes since then.   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    Past Surgical History:  Procedure Laterality Date   ANKLE FRACTURE SURGERY Right 1991   APPENDECTOMY  ATRIAL FIBRILLATION ABLATION N/A 03/31/2018   Procedure: ATRIAL FIBRILLATION ABLATION;  Surgeon: [name], MD;  Location: [facility];  Service: Cardiovascular;  Laterality: N/A;   CARDIAC ELECTROPHYSIOLOGY MAPPING AND ABLATION     CARDIOVERSION N/A 03/23/2021   Procedure: CARDIOVERSION;  Surgeon: Acie Fredrickson Wonda Cheng, MD;  Location: [facility];  Service: Cardiovascular;  Laterality: N/A;   COLONOSCOPY W/ BIOPSIES  2017   x7   CORONARY ANGIOPLASTY WITH STENT PLACEMENT     CORONARY ARTERY BYPASS GRAFT     FINGER SURGERY  04/2018   Small finger left hand   implantable loop recorder placement  07/02/2019   Medtronic Reveal [place] model LNQ11 (SN [id number]) implanted  in office by Dr Rayann Heman   TEE WITHOUT CARDIOVERSION N/A 03/30/2018   Procedure: TRANSESOPHAGEAL ECHOCARDIOGRAM (TEE);  Surgeon: [name], MD;  Location: [facility];  Service: Cardiovascular;  Laterality: N/A;   TEE WITHOUT CARDIOVERSION N/A 03/23/2021   Procedure: TRANSESOPHAGEAL ECHOCARDIOGRAM (TEE);  Surgeon: Acie Fredrickson Wonda Cheng, MD;  Location: [facility];  Service: Cardiovascular;  Laterality: N/A;   TOTAL HIP ARTHROPLASTY Left    TOTAL HIP ARTHROPLASTY Right 09/12/2018   Procedure: TOTAL HIP ARTHROPLASTY ANTERIOR APPROACH;  Surgeon: [name], MD;  Location: [facility];  Service: Orthopedics;  Laterality: Right;  70 mins    Family History  Problem Relation Age of Onset   Colon cancer Mother    Cancer Father        UNKNOWN TYPE   Heart disease Paternal Grandmother    Heart disease Paternal Grandfather     Social History   Socioeconomic History   Marital status: Married    Spouse name: Not on file   Number of children: 0   Years of education: Not on file   Highest education level: Not on file  Occupational History   Occupation: retired  Tobacco Use   Smoking status: Former    Packs/day: 1.00    Years: 7.00    Pack years: 7.00    Types: Cigarettes    Quit date: 10/04/1977    Years since quitting: 43.5   Smokeless tobacco: Never   Tobacco comments:    never smoked over 1 pack   Vaping Use   Vaping Use: Never used  Substance and Sexual Activity   Alcohol use: Yes    Alcohol/week: 3.0 - 4.0 standard drinks    Types: 1 Glasses of wine, 2 - 3 Shots of liquor per week    Comment: DAILY   Drug use: No    Comment: Smoked Marijuana back in 1970s   Sexual activity: Yes  Other Topics Concern   Not on file  Social History Narrative   Married, no children   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 - returned to [employer] from [place] 2014   Enjoys bird watching, reading historical books about WWII, watching tv   Social Determinants of Adult nurse Strain: Low Risk    Difficulty of Paying Living Expenses: Not hard at [employer] Insecurity: Not on file  Transportation Needs: No Transportation Needs   Lack of Transportation (Medical): No   Lack of Transportation (Non-Medical): No  Physical Activity: Inactive   Days of Exercise per Week: 0 days   Minutes of Exercise per Session: 0 min  Stress: No Stress Concern Present   Feeling of Stress : Not at all  Social Connections: Moderately Isolated   Frequency of Communication with Friends  and Family: More than three times a week   Frequency of Social Gatherings with Friends and Family: Never   Attends Religious Services: Never   Marine scientist or Organizations: No   Attends Music therapist: Never   Marital Status: Married  Human resources officer Violence: Not At Risk   Fear of Current or Ex-Partner: No   Emotionally Abused: No   Physically Abused: No   Sexually Abused: No    Outpatient Medications Prior to Visit  Medication Sig Dispense Refill   acetaminophen (TYLENOL) 325 MG tablet Take 325 mg by mouth every 8 (eight) hours as needed for moderate pain.     allopurinol (ZYLOPRIM) 300 MG tablet TAKE 1 TABLET(300 MG) BY MOUTH DAILY 90 tablet 3   amiodarone (PACERONE) 200 MG tablet Take 2 tablets (400 mg total) by mouth 2 (two) times daily for 14 days then decrease to 1 tablet (234m) twice daily 88 tablet 0   B COMPLEX-C-FOLIC ACID PO Take 1 tablet by mouth daily.     Blood Glucose Monitoring Suppl (ONETOUCH VERIO) w/Device KIT Use 1-4 times daily as needed/directed DX E11.9 1 kit 0   DULoxetine (CYMBALTA) 60 MG capsule TAKE 1 CAPSULE(60 MG) BY MOUTH DAILY 90 capsule 1   ELIQUIS 5 MG TABS tablet TAKE 1 TABLET(5 MG) BY MOUTH TWICE DAILY 180 tablet 1   Fluticasone-Umeclidin-Vilant (TRELEGY ELLIPTA) 100-62.5-25 MCG/INH AEPB Inhale 1 puff into the lungs daily. 60 each 3   folic acid (FOLVITE) 1 MG tablet Take 1 tablet (1 mg total) by mouth daily. 30  tablet 2   gabapentin (NEURONTIN) 300 MG capsule Take 2 capsules at night as needed for restless leg symptoms 180 capsule 3   icosapent Ethyl (VASCEPA) 1 g capsule TAKE 2 CAPSULES(2 GRAMS) BY MOUTH TWICE DAILY 180 capsule 3   insulin glargine, 2 Unit Dial, (TOUJEO MAX SOLOSTAR) 300 UNIT/ML Solostar Pen Inject 130 Units into the skin daily. (Patient taking differently: Inject 100 Units into the skin daily.) 9 mL 3   Insulin Pen Needle (NOVOFINE PEN NEEDLE) 32G X 6 MM MISC Use 1-4 times daily as needed for insulin 100 each 3   JANUVIA 100 MG tablet TAKE 1 TABLET(100 MG) BY MOUTH DAILY 90 tablet 1   JARDIANCE 25 MG TABS tablet TAKE 1 TABLET(25 MG) BY MOUTH DAILY BEFORE AND BREAKFAST 90 tablet 1   Lancets (ONETOUCH ULTRASOFT) lancets Use 1-4 times daily as needed/directed  DX E11.9 200 each 12   Lidocaine HCl 4 % CREA Apply 1 application topically daily as needed (pain).     Magnesium Oxide 400 MG CAPS Take 1 capsule (400 mg total) by mouth daily. 90 capsule 1   metFORMIN (GLUCOPHAGE-XR) 500 MG 24 hr tablet Take 1 tablet (500 mg total) by mouth in the morning and at bedtime. 180 tablet 3   metoprolol tartrate (LOPRESSOR) 50 MG tablet Take 1 tablet (50 mg total) by mouth 3 (three) times daily. 90 tablet 0   montelukast (SINGULAIR) 10 MG tablet TAKE 1 TABLET BY MOUTH AT BEDTIME 90 tablet 3   Multiple Vitamin (MULTIVITAMIN WITH MINERALS) TABS tablet Take 1 tablet by mouth daily.     ONETOUCH VERIO test strip Use 1-4 times daily as directed/needed   DX E11.9 200 each 12   rosuvastatin (CRESTOR) 40 MG tablet Take 40 mg by mouth daily.     thiamine (VITAMIN B-1) 100 MG tablet Take 500 mg by mouth daily.     No facility-administered medications prior to  visit.    Allergies  Allergen Reactions   Xarelto [Rivaroxaban] Other (See Comments)    Patient stated he "ended up in the hospital with a rectal bleed"   Amoxicillin Rash and Other (See Comments)    * SEVERE RASH IN GROIN AREA Has patient had a PCN  reaction causing immediate rash, facial/tongue/throat swelling, SOB or lightheadedness with hypotension: no Has patient had a PCN reaction causing severe rash involving mucus membranes or skin necrosis: no Has patient had a PCN reaction that required hospitalization: no Has patient had a PCN reaction occurring within the last 10 years: yes If all of the above answers are "NO", then may proceed with Cephalosporin use.    Augmentin [Amoxicillin-Pot Clavulanate] Rash and Other (See Comments)    * SEVERE RASH IN GROIN AREA   Azithromycin Rash and Other (See Comments)    * SEVERE RASH IN GROIN AREA   Clindamycin/Lincomycin Rash   Keflex [Cephalexin] Rash    ROS Review of Systems  Constitutional:  Negative for appetite change, chills, fever and unexpected weight change.  Respiratory:  Negative for cough.   Cardiovascular:  Negative for chest pain and palpitations.  Gastrointestinal:  Negative for abdominal pain.  Genitourinary:  Negative for dysuria.  Neurological:  Negative for seizures and headaches.  Psychiatric/Behavioral:  Negative for agitation and confusion.      Objective:    Physical Exam Vitals reviewed.  HENT:     Head: Normocephalic and atraumatic.  Cardiovascular:     Comments: Irregular rhythm but rate controlled Pulmonary:     Effort: Pulmonary effort is normal.     Breath sounds: Normal breath sounds.  Musculoskeletal:     Cervical back: Neck supple.     Right lower leg: No edema.     Left lower leg: No edema.  Neurological:     Mental Status: He is alert.     Comments: Some mild droop left side of mouth.  Does have ability to wrinkle forehead at this time he can fully close both eyelids    BP 120/68 (BP Location: Left Arm, Patient Position: Sitting, Cuff Size: Normal)   Pulse 80   Temp 98.2 F (36.8 C) (Oral)   Wt 243 lb 4.8 oz (110.4 kg)   SpO2 98%   BMI 34.91 kg/m  Wt Readings from Last 3 Encounters:  04/01/21 243 lb 4.8 oz (110.4 kg)  03/31/21 241  lb 12.8 oz (109.7 kg)  03/24/21 233 lb 7.5 oz (105.9 kg)     Health Maintenance Due  Topic Date Due   TETANUS/TDAP  10/07/2019   FOOT EXAM  09/23/2020   COLONOSCOPY (Pts 45-63yr Insurance coverage will need to be confirmed)  11/19/2020   COVID-19 Vaccine (4 - Booster for PMariettaseries) 12/27/2020    There are no preventive care reminders to display for this patient.  Lab Results  Component Value Date   TSH 2.034 03/20/2021   Lab Results  Component Value Date   WBC 14.5 (H) 03/24/2021   HGB 12.6 (L) 03/24/2021   HCT 38.6 (L) 03/24/2021   MCV 96.7 03/24/2021   PLT 249 03/24/2021   Lab Results  Component Value Date   NA 139 03/24/2021   K 4.3 03/24/2021   CO2 25 03/24/2021   GLUCOSE 140 (H) 03/24/2021   BUN 28 (H) 03/24/2021   CREATININE 1.08 03/24/2021   BILITOT 1.1 03/16/2021   ALKPHOS 51 03/16/2021   AST 31 03/16/2021   ALT 31 03/16/2021   PROT 6.6  03/16/2021   ALBUMIN 3.4 (L) 03/16/2021   CALCIUM 10.0 03/24/2021   ANIONGAP 10 03/24/2021   EGFR 96 12/08/2020   GFR 75.74 12/03/2019   Lab Results  Component Value Date   CHOL 137 09/23/2020   Lab Results  Component Value Date   HDL 40 09/23/2020   Lab Results  Component Value Date   LDLCALC 64 09/23/2020   Lab Results  Component Value Date   TRIG 280 (H) 09/23/2020   Lab Results  Component Value Date   CHOLHDL 3.4 09/23/2020   Lab Results  Component Value Date   HGBA1C 5.7 (H) 03/20/2021      Assessment & Plan:   #1 recent admission for A. fib with rapid ventricular response.  Patient followed through A. fib clinic.  He is currently on amiodarone.  Recent TSH normal.  He remains on metoprolol and Eliquis.  He is aware that his alcohol may certainly be contributing to his A. fib load but he refuses to quit drinking.  #2 type 2 diabetes.  Recent A1c 5.7%.  High risk for hypoglycemia. -Reduce Toujeo to 100 units daily.  May need to scale back even further.  Watch closely for symptoms of  hypoglycemia  #3 longstanding history of alcohol abuse.  We had multiple discussions.  Patient refuses to quit.  #4 history of recurrent falls.  Extremely high risk and especially with his Eliquis.  He does have some peripheral neuropathy which is likely contributing along with generalized weakness.  He is encouraged to use walker at all times but he refuses  #5 probable recent [name].  Patient was treated with Valtrex and prednisone.  Does not have any inability to close the eye and has good wrinkling of both sides of the forehead.  Minimal facial droop.  No further treatments recommended.  Recent MRI revealed no masses  No orders of the defined types were placed in this encounter.   Follow-up: Return in about 3 months (around 07/02/2021).    Carolann Littler, MD

## 2021-04-05 ENCOUNTER — Emergency Department (HOSPITAL_COMMUNITY): Payer: Medicare Other

## 2021-04-05 ENCOUNTER — Emergency Department (HOSPITAL_COMMUNITY)
Admission: EM | Admit: 2021-04-05 | Discharge: 2021-04-05 | Disposition: A | Discharge status: Home / Self Care | Payer: Medicare Other | Attending: Emergency Medicine | Admitting: Emergency Medicine

## 2021-04-05 DIAGNOSIS — R002 Palpitations: Secondary | ICD-10-CM | POA: Diagnosis not present

## 2021-04-05 DIAGNOSIS — Z951 Presence of aortocoronary bypass graft: Secondary | ICD-10-CM | POA: Insufficient documentation

## 2021-04-05 DIAGNOSIS — Z85828 Personal history of other malignant neoplasm of skin: Secondary | ICD-10-CM | POA: Diagnosis not present

## 2021-04-05 DIAGNOSIS — Z79899 Other long term (current) drug therapy: Secondary | ICD-10-CM | POA: Insufficient documentation

## 2021-04-05 DIAGNOSIS — Z91138 Patient's unintentional underdosing of medication regimen for other reason: Secondary | ICD-10-CM | POA: Diagnosis not present

## 2021-04-05 DIAGNOSIS — Z7901 Long term (current) use of anticoagulants: Secondary | ICD-10-CM | POA: Diagnosis not present

## 2021-04-05 DIAGNOSIS — Z87891 Personal history of nicotine dependence: Secondary | ICD-10-CM | POA: Diagnosis not present

## 2021-04-05 DIAGNOSIS — I2510 Atherosclerotic heart disease of native coronary artery without angina pectoris: Secondary | ICD-10-CM | POA: Insufficient documentation

## 2021-04-05 DIAGNOSIS — T462X6A Underdosing of other antidysrhythmic drugs, initial encounter: Secondary | ICD-10-CM | POA: Diagnosis not present

## 2021-04-05 DIAGNOSIS — J45909 Unspecified asthma, uncomplicated: Secondary | ICD-10-CM | POA: Diagnosis not present

## 2021-04-05 DIAGNOSIS — E119 Type 2 diabetes mellitus without complications: Secondary | ICD-10-CM | POA: Insufficient documentation

## 2021-04-05 DIAGNOSIS — I10 Essential (primary) hypertension: Secondary | ICD-10-CM | POA: Insufficient documentation

## 2021-04-05 DIAGNOSIS — T50996A Underdosing of other drugs, medicaments and biological substances, initial encounter: Secondary | ICD-10-CM | POA: Diagnosis not present

## 2021-04-05 DIAGNOSIS — Z96643 Presence of artificial hip joint, bilateral: Secondary | ICD-10-CM | POA: Diagnosis not present

## 2021-04-05 DIAGNOSIS — J449 Chronic obstructive pulmonary disease, unspecified: Secondary | ICD-10-CM | POA: Insufficient documentation

## 2021-04-05 DIAGNOSIS — R0602 Shortness of breath: Secondary | ICD-10-CM | POA: Diagnosis not present

## 2021-04-05 DIAGNOSIS — I4891 Unspecified atrial fibrillation: Secondary | ICD-10-CM | POA: Insufficient documentation

## 2021-04-05 DIAGNOSIS — Y900 Blood alcohol level of less than 20 mg/100 ml: Secondary | ICD-10-CM | POA: Diagnosis not present

## 2021-04-05 DIAGNOSIS — Z8249 Family history of ischemic heart disease and other diseases of the circulatory system: Secondary | ICD-10-CM | POA: Diagnosis not present

## 2021-04-05 DIAGNOSIS — J811 Chronic pulmonary edema: Secondary | ICD-10-CM | POA: Diagnosis not present

## 2021-04-05 DIAGNOSIS — R42 Dizziness and giddiness: Secondary | ICD-10-CM | POA: Diagnosis not present

## 2021-04-05 DIAGNOSIS — R0902 Hypoxemia: Secondary | ICD-10-CM | POA: Diagnosis not present

## 2021-04-05 LAB — COMPREHENSIVE METABOLIC PANEL
ALT: 68 U/L — ABNORMAL HIGH (ref 0–44)
AST: 63 U/L — ABNORMAL HIGH (ref 15–41)
Albumin: 3.7 g/dL (ref 3.5–5.0)
Alkaline Phosphatase: 42 U/L (ref 38–126)
Anion gap: 17 — ABNORMAL HIGH (ref 5–15)
BUN: 7 mg/dL — ABNORMAL LOW (ref 8–23)
CO2: 20 mmol/L — ABNORMAL LOW (ref 22–32)
Calcium: 8.6 mg/dL — ABNORMAL LOW (ref 8.9–10.3)
Chloride: 103 mmol/L (ref 98–111)
Creatinine, Ser: 0.69 mg/dL (ref 0.61–1.24)
GFR, Estimated: >60 mL/min (ref >60)
Glucose, Bld: 138 mg/dL — ABNORMAL HIGH (ref 70–99)
Potassium: 3.2 mmol/L — ABNORMAL LOW (ref 3.5–5.1)
Sodium: 140 mmol/L (ref 135–145)
Total Bilirubin: 1.3 mg/dL — ABNORMAL HIGH (ref 0.3–1.2)
Total Protein: 7 g/dL (ref 6.5–8.1)

## 2021-04-05 LAB — CBC
HCT: 40.5 % (ref 39.0–52.0)
Hemoglobin: 13.4 g/dL (ref 13.0–17.0)
MCH: 31.6 pg (ref 26.0–34.0)
MCHC: 33.1 g/dL (ref 30.0–36.0)
MCV: 95.5 fL (ref 80.0–100.0)
Platelets: 181 10*3/uL (ref 150–400)
RBC: 4.24 MIL/uL (ref 4.22–5.81)
RDW: 15 % (ref 11.5–15.5)
WBC: 10.5 10*3/uL (ref 4.0–10.5)
nRBC: 0 % (ref 0.0–0.2)

## 2021-04-05 LAB — BRAIN NATRIURETIC PEPTIDE: B Natriuretic Peptide: 429.5 pg/mL — ABNORMAL HIGH (ref 0.0–100.0)

## 2021-04-05 LAB — TROPONIN I (HIGH SENSITIVITY)
Troponin I (High Sensitivity): 16 ng/L (ref <18)
Troponin I (High Sensitivity): 16 ng/L (ref <18)

## 2021-04-05 LAB — ETHANOL: Alcohol, Ethyl (B): 13 mg/dL — ABNORMAL HIGH (ref <10)

## 2021-04-05 MED ORDER — DILTIAZEM HCL 25 MG/5ML IV SOLN
20.0000 mg | Freq: Once | INTRAVENOUS | Status: AC
  Administered 2021-04-05: 20 mg via INTRAVENOUS
  Filled 2021-04-05: qty 5

## 2021-04-05 MED ORDER — AMIODARONE HCL 200 MG PO TABS
200.0000 mg | ORAL_TABLET | Freq: Every day | ORAL | Status: DC
  Administered 2021-04-05: 200 mg via ORAL
  Filled 2021-04-05: qty 1

## 2021-04-05 NOTE — Discharge Instructions (Addendum)
You were evaluated in the Emergency Department and after careful evaluation, we did not find any emergent condition requiring admission or further testing in the hospital.  Your exam/testing today was overall reassuring.  It is important that you take all of your home medications as prescribed.  We recommend close follow-up with your cardiologist to discuss your emergency department visit and your symptoms.  Please return to the Emergency Department if you experience any worsening of your condition.  Thank you for allowing [place] to be a part of your care.

## 2021-04-05 NOTE — ED Provider Notes (Signed)
[facility] Emergency Department Provider Note MRN:  [id number]  Arrival date & time: 04/05/21     Chief Complaint   Palpitations History of Present Illness   Adam [name] is a 70 y.o. year-old male with a history of CAD, COPD, diabetes, A. fib presenting to the ED with chief complaint of palpitations.  Palpitations that began 2 or 3 days ago.  Missed his amiodarone the past 2 days.  Endorsing shortness of breath, denies chest pain.  No fever or cough, no abdominal pain, no numbness or weakness to the arms or legs.  Feels like his heart is racing.  Also missed few doses of his Eliquis last week.  Symptoms are constant, moderate in severity, no exacerbating or alleviating factors other than some dyspnea with exertion.  Review of Systems  A complete 10 system review of systems was obtained and all systems are negative except as noted in the HPI and PMH.   Patient's Health History    Past Medical History:  Diagnosis Date   Allergy    Anxiety    history of PTSD following CABG   Ascending aortic aneurysm ([place]) 01/31/2018   43 x 42 mm, pt unaware   Asthma    Cardiomegaly 10/17/2017   Colitis- colonoscopy 2014 07/13/2015   COPD (chronic obstructive pulmonary disease) ([place])    Coronary artery disease    x 6   Depression    Diabetes mellitus without complication ([place])    Family history of polyps in the colon    Finger dislocation    Left pinkie   GERD (gastroesophageal reflux disease)    Gout    H/O atrial fibrillation without current medication    following CABG with no documented episodes since then.   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    Past Surgical History:  Procedure Laterality Date   ANKLE  FRACTURE SURGERY Right 1991   APPENDECTOMY     ATRIAL FIBRILLATION ABLATION N/A 03/31/2018   Procedure: ATRIAL FIBRILLATION ABLATION;  Surgeon: [name], MD;  Location: [facility];  Service: Cardiovascular;  Laterality: N/A;   CARDIAC ELECTROPHYSIOLOGY MAPPING AND ABLATION     CARDIOVERSION N/A 03/23/2021   Procedure: CARDIOVERSION;  Surgeon: Acie Fredrickson Wonda Cheng, MD;  Location: [facility];  Service: Cardiovascular;  Laterality: N/A;   COLONOSCOPY W/ BIOPSIES  2017   x7   CORONARY ANGIOPLASTY WITH STENT PLACEMENT     CORONARY ARTERY BYPASS GRAFT     FINGER SURGERY  04/2018   Small finger left hand   implantable loop recorder placement  07/02/2019   Medtronic Reveal [place] model LNQ11 (SN [id number]) implanted in office by Dr Rayann Heman   TEE WITHOUT CARDIOVERSION N/A 03/30/2018   Procedure: TRANSESOPHAGEAL ECHOCARDIOGRAM (TEE);  Surgeon: [name], MD;  Location: [place];  Service: Cardiovascular;  Laterality: N/A;   TEE WITHOUT CARDIOVERSION N/A 03/23/2021   Procedure: TRANSESOPHAGEAL ECHOCARDIOGRAM (TEE);  Surgeon: Acie Fredrickson Wonda Cheng, MD;  Location: [facility];  Service: Cardiovascular;  Laterality: N/A;   TOTAL HIP ARTHROPLASTY Left    TOTAL HIP ARTHROPLASTY Right 09/12/2018   Procedure: TOTAL HIP ARTHROPLASTY ANTERIOR APPROACH;  Surgeon: [name], MD;  Location: [facility];  Service: Orthopedics;  Laterality: Right;  70 mins    Family History  Problem Relation Age of Onset   Colon cancer Mother    Cancer Father        UNKNOWN TYPE   Heart disease Paternal Grandmother    Heart disease Paternal Grandfather     Social History   Socioeconomic History   Marital status: Married    Spouse name: Not on file   Number of children: 0   Years of education: Not on file   Highest education level: Not on file  Occupational History   Occupation: retired  Tobacco Use   Smoking status: Former    Packs/day: 1.00    Years: 7.00    Pack years: 7.00    Types: Cigarettes     Quit date: 10/04/1977    Years since quitting: 43.5   Smokeless tobacco: Never   Tobacco comments:    never smoked over 1 pack   Vaping Use   Vaping Use: Never used  Substance and Sexual Activity   Alcohol use: Yes    Alcohol/week: 3.0 - 4.0 standard drinks    Types: 1 Glasses of wine, 2 - 3 Shots of liquor per week    Comment: DAILY   Drug use: No    Comment: Smoked Marijuana back in 1970s   Sexual activity: Yes  Other Topics Concern   Not on file  Social History Narrative   Married, no children   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 - returned to [employer] from [place] 2014   Enjoys bird watching, reading historical books about WWII, watching tv   Social Determinants of Radio broadcast assistant Strain: Low Risk    Difficulty of Paying Living Expenses: Not hard at [employer] Insecurity: Not on file  Transportation Needs: No Transportation Needs   Lack of Transportation (Medical): No   Lack of Transportation (Non-Medical): No  Physical Activity: Inactive   Days of Exercise per Week: 0 days   Minutes of Exercise per Session: 0 min  Stress: No Stress Concern Present   Feeling of Stress : Not at all  Social Connections: Moderately Isolated   Frequency of Communication with Friends and Family: More than three times a week   Frequency of Social Gatherings with Friends and Family: Never   Attends Religious Services: Never   Marine scientist or Organizations: No   Attends Music therapist: Never   Marital Status: Married  Human resources officer Violence: Not At Risk   Fear of Current or Ex-Partner: No   Emotionally Abused: No   Physically Abused: No   Sexually Abused: No     Physical Exam   Vitals:   04/05/21 0300 04/05/21 0430  BP: (!) 165/130 (!) 177/89  Pulse: 78 76  Resp: (!) 23 19  Temp:    SpO2: 98% 95%    CONSTITUTIONAL: Chronically ill-appearing, NAD NEURO:  Alert and oriented x 3, no focal deficits EYES:  eyes  equal and reactive ENT/NECK:  no LAD, no JVD CARDIO: Tachycardic rate irregular rhythm, well-perfused, normal S1 and S2 PULM:  CTAB no wheezing or rhonchi GI/GU:  normal bowel sounds, non-distended, non-tender MSK/SPINE:  No gross deformities, no edema SKIN:  no rash, atraumatic PSYCH:  Appropriate speech and behavior  *Additional and/or pertinent findings included in MDM below  Diagnostic and Interventional Summary    EKG Interpretation  Date/Time:  April 05, 2021  at 01: 01: 46 Ventricular Rate:  103 PR Interval:    QRS Duration: 98 QT Interval:  367 QTC Calculation: 481 R Axis:     Text Interpretation: Atrial fibrillation Confirmed by Dr. Gerlene Fee at 1:13 AM        Labs Reviewed  COMPREHENSIVE METABOLIC PANEL - Abnormal; Notable for the following components:      Result Value   Potassium 3.2 (*)    CO2 20 (*)    Glucose, Bld 138 (*)    BUN 7 (*)    Calcium 8.6 (*)    AST 63 (*)    ALT 68 (*)    Total Bilirubin 1.3 (*)    Anion gap 17 (*)    All other components within normal limits  BRAIN NATRIURETIC PEPTIDE - Abnormal; Notable for the following components:   B Natriuretic Peptide 429.5 (*)    All other components within normal limits  ETHANOL - Abnormal; Notable for the following components:   Alcohol, Ethyl (B) 13 (*)    All other components within normal limits  CBC  TROPONIN I (HIGH SENSITIVITY)  TROPONIN I (HIGH SENSITIVITY)    DG Chest Port 1 View  Final Result      Medications  amiodarone (PACERONE) tablet 200 mg (has no administration in time range)  diltiazem (CARDIZEM) injection 20 mg (20 mg Intravenous Given 04/05/21 0339)     Procedures  /  Critical Care .Critical Care  Date/Time: 04/05/2021 2:04 AM Performed by: Maudie Flakes, MD Authorized by: Maudie Flakes, MD   Critical care provider statement:    Critical care time (minutes):  34   Critical care was necessary to treat or prevent imminent or life-threatening deterioration of  the following conditions: A. fib with RVR.   Critical care was time spent personally by me on the following activities:  Discussions with consultants, evaluation of patient's response to treatment, examination of patient, ordering and performing treatments and interventions, ordering and review of laboratory studies, ordering and review of radiographic studies, pulse oximetry, re-evaluation of patient's condition, obtaining history from patient or surrogate and review of old charts  ED Course and Medical Decision Making  I have reviewed the triage vital signs, the nursing notes, and pertinent available records from the EMR.  Listed above are laboratory and imaging tests that I personally ordered, reviewed, and interpreted and then considered in my medical decision making (see below for details).  A. fib with RVR, known history of A. fib, on Eliquis, on amiodarone but has missed some of these medications recently.  Rates ranging between 100 and 140.  Sitting comfortably in no acute distress.  Not a good candidate for cardioversion given the duration of the palpitations and the missed doses of Eliquis, providing diltiazem and will reassess.  No evidence of DVT, no hypoxia, doubt PE.     Patient feeling a lot better, rates well controlled now ranging between 70 and 100.  Observed for a few hours and continues to feel well.  Labs reassuring.  No indication for further testing or admission, patient encouraged to be compliant with her medications and follow-up with cardiology.  Barth Kirks. Sedonia Small, MD [place] [email or web address]  Final Clinical Impressions(s) / ED Diagnoses     ICD-10-CM   1. Atrial fibrillation with RVR ([place])  I48.91       ED Discharge Orders     None        Discharge Instructions Discussed  with and Provided to Patient:   Discharge Instructions   None       Maudie Flakes, MD 04/05/21 [phone]

## 2021-04-05 NOTE — ED Triage Notes (Signed)
Pt bib EMS from home for SOB, heart palpitations, and dizziness that began yesterday afternoon. Hx a fib and ablations. In A fib RVR with EMS, 50-145 bpm. No converting meds given by EMS. Endorses N/V 7/2. Missed past 2 home amiodarone doses.   20G IV in L AC  EMS vitals: BP 203/135 RR 24 90% on room air, 90% also on 2L [place] CBG 136  174mL NS given by EMS

## 2021-04-07 ENCOUNTER — Ambulatory Visit (INDEPENDENT_AMBULATORY_CARE_PROVIDER_SITE_OTHER): Payer: Medicare Other

## 2021-04-07 DIAGNOSIS — I4891 Unspecified atrial fibrillation: Secondary | ICD-10-CM | POA: Diagnosis not present

## 2021-04-07 LAB — CUP PACEART REMOTE DEVICE CHECK
Date Time Interrogation Session: 20220705040732
Implantable Pulse Generator Implant Date: 20200928

## 2021-04-08 ENCOUNTER — Inpatient Hospital Stay: Payer: Medicare Other | Admitting: Family Medicine

## 2021-04-16 ENCOUNTER — Ambulatory Visit (HOSPITAL_COMMUNITY): Payer: Medicare Other | Admitting: Nurse Practitioner

## 2021-04-21 ENCOUNTER — Other Ambulatory Visit: Payer: Self-pay

## 2021-04-21 ENCOUNTER — Ambulatory Visit (HOSPITAL_COMMUNITY)
Admission: RE | Admit: 2021-04-21 | Discharge: 2021-04-21 | Disposition: A | Discharge status: Home / Self Care | Payer: Medicare Other | Source: Ambulatory Visit | Attending: Nurse Practitioner | Admitting: Nurse Practitioner

## 2021-04-21 ENCOUNTER — Encounter (HOSPITAL_COMMUNITY): Payer: Self-pay | Admitting: Nurse Practitioner

## 2021-04-21 ENCOUNTER — Telehealth: Payer: Self-pay

## 2021-04-21 VITALS — BP 156/94 | HR 75 | Ht 70.0 in | Wt 239.4 lb

## 2021-04-21 DIAGNOSIS — Z79899 Other long term (current) drug therapy: Secondary | ICD-10-CM | POA: Diagnosis not present

## 2021-04-21 DIAGNOSIS — E785 Hyperlipidemia, unspecified: Secondary | ICD-10-CM | POA: Insufficient documentation

## 2021-04-21 DIAGNOSIS — K5900 Constipation, unspecified: Secondary | ICD-10-CM | POA: Insufficient documentation

## 2021-04-21 DIAGNOSIS — Z6834 Body mass index (BMI) 34.0-34.9, adult: Secondary | ICD-10-CM | POA: Insufficient documentation

## 2021-04-21 DIAGNOSIS — Z96643 Presence of artificial hip joint, bilateral: Secondary | ICD-10-CM | POA: Diagnosis not present

## 2021-04-21 DIAGNOSIS — I10 Essential (primary) hypertension: Secondary | ICD-10-CM | POA: Insufficient documentation

## 2021-04-21 DIAGNOSIS — I480 Paroxysmal atrial fibrillation: Secondary | ICD-10-CM

## 2021-04-21 DIAGNOSIS — Z7901 Long term (current) use of anticoagulants: Secondary | ICD-10-CM | POA: Insufficient documentation

## 2021-04-21 DIAGNOSIS — Z7984 Long term (current) use of oral hypoglycemic drugs: Secondary | ICD-10-CM | POA: Insufficient documentation

## 2021-04-21 DIAGNOSIS — I2510 Atherosclerotic heart disease of native coronary artery without angina pectoris: Secondary | ICD-10-CM | POA: Insufficient documentation

## 2021-04-21 DIAGNOSIS — F1010 Alcohol abuse, uncomplicated: Secondary | ICD-10-CM | POA: Diagnosis not present

## 2021-04-21 DIAGNOSIS — E119 Type 2 diabetes mellitus without complications: Secondary | ICD-10-CM | POA: Insufficient documentation

## 2021-04-21 DIAGNOSIS — Z888 Allergy status to other drugs, medicaments and biological substances status: Secondary | ICD-10-CM | POA: Diagnosis not present

## 2021-04-21 DIAGNOSIS — D6869 Other thrombophilia: Secondary | ICD-10-CM

## 2021-04-21 DIAGNOSIS — I4819 Other persistent atrial fibrillation: Secondary | ICD-10-CM | POA: Diagnosis not present

## 2021-04-21 DIAGNOSIS — E669 Obesity, unspecified: Secondary | ICD-10-CM | POA: Insufficient documentation

## 2021-04-21 DIAGNOSIS — Z951 Presence of aortocoronary bypass graft: Secondary | ICD-10-CM | POA: Insufficient documentation

## 2021-04-21 DIAGNOSIS — Z87891 Personal history of nicotine dependence: Secondary | ICD-10-CM | POA: Diagnosis not present

## 2021-04-21 DIAGNOSIS — G4733 Obstructive sleep apnea (adult) (pediatric): Secondary | ICD-10-CM | POA: Insufficient documentation

## 2021-04-21 DIAGNOSIS — Z7289 Other problems related to lifestyle: Secondary | ICD-10-CM | POA: Insufficient documentation

## 2021-04-21 DIAGNOSIS — Z881 Allergy status to other antibiotic agents status: Secondary | ICD-10-CM | POA: Diagnosis not present

## 2021-04-21 MED ORDER — METOPROLOL TARTRATE 50 MG PO TABS: 50.0000 mg | ORAL_TABLET | Freq: Two times a day (BID) | ORAL | Status: DC

## 2021-04-21 MED ORDER — AMIODARONE HCL 200 MG PO TABS: 200.0000 mg | ORAL_TABLET | Freq: Every day | ORAL | 1 refills | Status: DC

## 2021-04-21 NOTE — Addendum Note (Signed)
Encounter addended by: Enid Derry, CMA on: 04/21/2021 4:06 PM  Actions taken: Order Reconciliation Section accessed, Order list changed, Pharmacy for encounter modified

## 2021-04-21 NOTE — Progress Notes (Signed)
Primary Care Physician: [name], MD Referring Physician: Dr. Rayann Heman Primary Cardiologist: Dr Martin[place]  Henson D Handel is a 70 y.o. male with a Adam  CABG in 2011, done in [place], HTN, HLD, DM, Atrial fibrillation,alcohol use, and OSA. He underwent catheter ablation for atrial fibrillation by Dr. Rayann Heman 03/31/18. He was followed by Dr. Radford Pax in sleep clinic, but unable to tolerate CPAP and turned in his equipment. He was previously on Xarelto for a CHADS2VASC score of 3. Patient was admitted 10/24/19 for GI bleeding and alcohol withdrawal. EP was consulted to assist with dofetilide reloading. His Xarelto was held on admission and was not resumed at discharge, he was eventually placed back on eliquis.  He was discharged to rehab for a prolonged period of time for Wernicke's encephalopathy.   He is now back in the afib clinic for pt feeling rapid heart rate.. By his device, he had had 3 episodes of afib.Fastest rate was 146 bpm, with longest duration of 44 mins. Recent burden was actually improved at 9.4%, previously at 14%. Presenting rhythm for the interpretation was in rapid a  Flutter and he continues in this rhythm today. He continues o drink alcohol, when asked the amount, he states "enough". He continues to have untreated OSA.   F/u in afib clinic, 03/31/21. He had a hospitalization for afib with [facility], 6-17 thru 6-21, worsened with not taking cardizem for 3 days as the drug store was out of it per pt. He was having ongoing alcohol abuse. Last alcohol was that am, wine with breakfast. He had some slurred speech, left facial droop that had occurred days earlier, PCP obtained MRI, negative for stroke.EP was consulted doe [facility], cardizem drip started and the decision was made to stop Tikosyn and start amio load. Pt was on amio in  the remote past, had issues with skin discoloration but EP did not see any alternative drug options. Hospitalization was complicated by alcohol withdrawal.   In the office  today, afib is rate controlled. He continues on amiodarone 400 mg bid, x 2 weeks. He states he has missed a few doses of DOAC, reminded not to do this with pending cardioversion. He feels improved.   F/u in afib clinic 04/21/21 after an ER visit for palpitations and found to be in afib. He had missed several dose of amiodarone and eliquis the week before. Planned for reloading of amiodarone and sent home. He is in SR today. He has been on 200 mg bid for the last 2 weeks. He will lower to 200 mg daily now. His is describing some constipation with amiodarone.   Today, he denies symptoms of palpitations, chest pain, mild increase of shortness of breath and fatigue,  orthopnea, PND, lower extremity edema, dizziness, presyncope, syncope, or neurologic sequela. The patient is tolerating medications without difficulties and is otherwise without complaint today.   Past Medical History:  Diagnosis Date   Allergy    Anxiety    history of PTSD following CABG   Ascending aortic aneurysm ([place]) 01/31/2018   43 x 42 mm, pt unaware   Asthma    Cardiomegaly 10/17/2017   Colitis- colonoscopy 2014 07/13/2015   COPD (chronic obstructive pulmonary disease) ([place])    Coronary artery disease    x 6   Depression    Diabetes mellitus without complication ([place])    Family history of polyps in the colon    Finger dislocation    Left pinkie   GERD (gastroesophageal reflux disease)  Gout    Adam [name]    following CABG with no documented episodes since then.   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   Past Surgical History:  Procedure Laterality Date   ANKLE FRACTURE SURGERY Right 1991   APPENDECTOMY     ATRIAL FIBRILLATION  ABLATION N/A 03/31/2018   Procedure: ATRIAL FIBRILLATION ABLATION;  Surgeon: [name], MD;  Location: [facility];  Service: Cardiovascular;  Laterality: N/A;   CARDIAC ELECTROPHYSIOLOGY MAPPING AND ABLATION     CARDIOVERSION N/A 03/23/2021   Procedure: CARDIOVERSION;  Surgeon: Acie Fredrickson Wonda Cheng, MD;  Location: [facility];  Service: Cardiovascular;  Laterality: N/A;   COLONOSCOPY W/ BIOPSIES  2017   x7   CORONARY ANGIOPLASTY WITH STENT PLACEMENT     CORONARY ARTERY BYPASS GRAFT     FINGER SURGERY  04/2018   Small finger left hand   implantable loop recorder placement  07/02/2019   Medtronic Reveal [place] model [id number] (SN [id number]) implanted in office by Dr Rayann Heman   TEE WITHOUT CARDIOVERSION N/A 03/30/2018   Procedure: TRANSESOPHAGEAL ECHOCARDIOGRAM (TEE);  Surgeon: [name], MD;  Location: [place] [facility];  Service: Cardiovascular;  Laterality: N/A;   TEE WITHOUT CARDIOVERSION N/A 03/23/2021   Procedure: TRANSESOPHAGEAL ECHOCARDIOGRAM (TEE);  Surgeon: Acie Fredrickson Wonda Cheng, MD;  Location: [facility];  Service: Cardiovascular;  Laterality: N/A;   TOTAL HIP ARTHROPLASTY Left    TOTAL HIP ARTHROPLASTY Right 09/12/2018   Procedure: TOTAL HIP ARTHROPLASTY ANTERIOR APPROACH;  Surgeon: [name], MD;  Location: [facility];  Service: Orthopedics;  Laterality: Right;  70 mins    Current Outpatient Medications  [name] Sig Dispense Refill   acetaminophen (TYLENOL) 325 MG tablet Take 325 mg by mouth every 8 (eight) hours as needed for moderate pain.     allopurinol (ZYLOPRIM) 300 MG tablet TAKE 1 TABLET(300 MG) BY MOUTH DAILY 90 tablet 3   amiodarone (PACERONE) 200 MG tablet Take 2 tablets (400 mg total) by mouth 2 (two) times daily for 14 days then decrease to 1 tablet (234m) twice daily (Patient taking differently: Take 200 mg by mouth 2 (two) times daily.) 88 tablet 0   B COMPLEX-C-FOLIC ACID PO Take 1 tablet by mouth daily.     Blood Glucose Monitoring Suppl (ONETOUCH VERIO)  w/Device KIT Use 1-4 times daily as needed/directed DX E11.9 1 kit 0   DULoxetine (CYMBALTA) 60 MG capsule TAKE 1 CAPSULE(60 MG) BY MOUTH DAILY 90 capsule 1   ELIQUIS 5 MG TABS tablet TAKE 1 TABLET(5 MG) BY MOUTH TWICE DAILY 180 tablet 1   Fluticasone-Umeclidin-Vilant (TRELEGY ELLIPTA) 100-62.5-25 MCG/INH AEPB Inhale 1 puff into the lungs daily. 60 each 3   folic acid (FOLVITE) 1 MG tablet Take 1 tablet (1 mg total) by mouth daily. 30 tablet 2   gabapentin (NEURONTIN) 300 MG capsule Take 2 capsules at night as needed for restless leg symptoms 180 capsule 3   icosapent Ethyl (VASCEPA) 1 g capsule TAKE 2 CAPSULES(2 GRAMS) BY MOUTH TWICE DAILY 180 capsule 3   insulin glargine, 2 Unit Dial, (TOUJEO MAX SOLOSTAR) 300 UNIT/ML Solostar Pen Inject 130 Units into the skin daily. (Patient taking differently: Inject 100  Units into the skin daily.) 9 mL 3   Insulin Pen Needle (NOVOFINE PEN NEEDLE) 32G X 6 MM MISC Use 1-4 times daily as needed for insulin 100 each 3   JANUVIA 100 MG tablet TAKE 1 TABLET(100 MG) BY MOUTH DAILY 90 tablet 1   JARDIANCE 25 MG TABS tablet TAKE 1 TABLET(25 MG) BY MOUTH DAILY BEFORE AND BREAKFAST 90 tablet 1   Lancets (ONETOUCH ULTRASOFT) lancets Use 1-4 times daily as needed/directed  DX E11.9 200 each 12   Lidocaine HCl 4 % CREA Apply 1 application topically daily as needed (pain).     Magnesium Oxide 400 MG CAPS Take 1 capsule (400 mg total) by mouth daily. 90 capsule 1   metFORMIN (GLUCOPHAGE-XR) 500 MG 24 hr tablet Take 1 tablet (500 mg total) by mouth in the morning and at bedtime. 180 tablet 3   metoprolol tartrate (LOPRESSOR) 50 MG tablet Take 1 tablet (50 mg total) by mouth 3 (three) times daily. 90 tablet 0   montelukast (SINGULAIR) 10 MG tablet TAKE 1 TABLET BY MOUTH AT BEDTIME 90 tablet 3   Multiple Vitamin (MULTIVITAMIN WITH MINERALS) TABS tablet Take 1 tablet by mouth daily.     ONETOUCH VERIO test strip Use 1-4 times daily as directed/needed   DX E11.9 200 each 12    rosuvastatin (CRESTOR) 40 MG tablet Take 40 mg by mouth daily.     thiamine (VITAMIN B-1) 100 MG tablet Take 500 mg by mouth daily.     No current facility-administered medications for this encounter.    Allergies  Allergen Reactions   Xarelto [Rivaroxaban] Other (See Comments)    Patient stated he "ended up in the hospital with a rectal bleed"   Amoxicillin Rash and Other (See Comments)    * SEVERE RASH IN GROIN AREA Has patient had a PCN reaction causing immediate rash, facial/tongue/throat swelling, SOB or lightheadedness with hypotension: no Has patient had a PCN reaction causing severe rash involving mucus membranes or skin necrosis: no Has patient had a PCN reaction that required hospitalization: no Has patient had a PCN reaction occurring within the last 10 years: yes If all of the above answers are "NO", then may proceed with Cephalosporin use.    Augmentin [Amoxicillin-Pot Clavulanate] Rash and Other (See Comments)    * SEVERE RASH IN GROIN AREA   Azithromycin Rash and Other (See Comments)    * SEVERE RASH IN GROIN AREA   Clindamycin/Lincomycin Rash   Keflex [Cephalexin] Rash    Social History   Socioeconomic History   Marital status: Married    Spouse name: Not on file   Number of children: 0   Years of education: Not on file   Highest education level: Not on file  Occupational History   Occupation: retired  Tobacco Use   Smoking status: Former    Packs/day: 1.00    Years: 7.00    Pack years: 7.00    Types: Cigarettes    Quit date: 10/04/1977    Years since quitting: 43.5   Smokeless tobacco: Never   Tobacco comments:    never smoked over 1 pack   Vaping Use   Vaping Use: Never used  Substance and Sexual Activity   Alcohol use: Yes    Alcohol/week: 3.0 - 4.0 standard drinks    Types: 1 Glasses of wine, 2 - 3 Shots of liquor per week    Comment: DAILY   Drug use: No    Comment: Smoked Marijuana back in  1970s   Sexual activity: Yes  Other Topics Concern    Not on file  Social History Narrative   Married, no children   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 - returned to [employer] from [place] 2014   Enjoys bird watching, reading historical books about WWII, watching tv   Social Determinants of Radio broadcast assistant Strain: Low Risk    Difficulty of Paying Living Expenses: Not hard at [employer] Insecurity: Not on file  Transportation Needs: No Transportation Needs   Lack of Transportation (Medical): No   Lack of Transportation (Non-Medical): No  Physical Activity: Inactive   Days of Exercise per Week: 0 days   Minutes of Exercise per Session: 0 min  Stress: No Stress Concern Present   Feeling of Stress : Not at all  Social Connections: Moderately Isolated   Frequency of Communication with Friends and Family: More than three times a week   Frequency of Social Gatherings with Friends and Family: Never   Attends Religious Services: Never   Marine scientist or Organizations: No   Attends Music therapist: Never   Marital Status: Married  Human resources officer Violence: Not At Risk   Fear of Current or Ex-Partner: No   Emotionally Abused: No   Physically Abused: No   Sexually Abused: No    Family History  Problem Relation Age of Onset   Colon cancer Mother    Cancer Father        UNKNOWN TYPE   Heart disease Paternal Grandmother    Heart disease Paternal Grandfather     ROS- All systems are reviewed and negative except as per the HPI above  Physical Exam: Vitals:   04/21/21 1507  Weight: 108.6 kg  Height: _0  (1.778 m)   Wt Readings from Last 3 Encounters:  04/21/21 108.6 kg  04/01/21 110.4 kg  03/31/21 109.7 kg    Labs: Lab Results  Component Value Date   NA 140 04/05/2021   K 3.2 (L) 04/05/2021   CL 103 04/05/2021   CO2 20 (L) 04/05/2021   GLUCOSE 138 (H) 04/05/2021   BUN 7 (L) 04/05/2021   CREATININE 0.69 04/05/2021   CALCIUM 8.6 (L) 04/05/2021    PHOS 3.2 03/20/2021   MG 2.5 (H) 03/24/2021   Lab Results  Component Value Date   INR 1.2 03/23/2021   Lab Results  Component Value Date   CHOL 137 09/23/2020   HDL 40 09/23/2020   LDLCALC 64 09/23/2020   TRIG 280 (H) 09/23/2020    GEN- The patient is well appearing obese male, alert and oriented x 3 today.   HEENT-head normocephalic, atraumatic, sclera clear, conjunctiva pink, hearing intact, trachea midline. Lungs- Clear to ausculation bilaterally, normal work of breathing Heart- regular rate and rhythm, no murmurs, rubs or gallops  GI- soft, NT, ND, + BS Extremities- no clubbing, cyanosis, or edema MS- no significant deformity or atrophy Skin- no rash or lesion Psych- euthymic mood, full affect Neuro- strength and sensation are intact   EKG- NSR at 75 bpm, pr int 174 ms, qrs int 104 ms, qtc 502 ms   Epic records reviewed   Assessment and Plan: 1. Persistent afib S/p ablation 03/2018. Reloaded on dofetilide 1/21-1/23/21 Increased a flutter burden over  the last several weeks  Then was back in afib in the ER 7/4 and amio load was increased to 200 mg bid  He is now  back in SR and can go back to 200 mg daily Continue metoprolol tartrate 50 mg bid  Ongoing  afib burden is not surprising with ongoing alcohol use and untreated sleep apnea   This patients CHA2DS2-VASc Score and unadjusted Ischemic Stroke Rate (% per year) is equal to 4.8 % stroke rate/year from a score of 4  Reminded to not miss any eliquis 5 mg bid going forward   Above score calculated as 1 point each if present [CHF, HTN, DM, Vascular=MI/PAD/Aortic Plaque, Age if 65-74, or Male] Above score calculated as 2 points each if present [Age > 75, or Stroke/TIA/TE]   2. OSA The importance of adequate treatment of sleep apnea was discussed today in order to improve our ability to maintain sinus rhythm long term. Patient not on CPAP therapy. Followed by Dr Radford Pax.  3. CAD  No recent issues with chest  pain  4. HTN Stable   5. Obesity Body mass index is 34.35 kg/m. Lifestyle modification was discussed and encouraged including regular physical activity and weight reduction. Avoidance of alcohol would be ideal   6. Constipation  He  feels is from amiodarone Decreasing dose  to 200 mg daily will help Stool softeners ok Discuss further with GI on appointment 8/11    I will see back in 3 months  Will need amio labs at that time    Butch Penny C. Emmali Karow, [facility] [address] [place],  [address] [phone]

## 2021-04-21 NOTE — Patient Instructions (Signed)
Decrease amiodarone to 200mg once a day 

## 2021-04-21 NOTE — Telephone Encounter (Signed)
Heather moore from the imaging center at [facility] called in to let us know that the patient has not completed the US carotid imaging. She is aware that he has been in the hospital recently and would like to know if this imaging is still needed so that she can call and schedule the patient.   Please advise.   Per Nira Conn it is ok to message her through Epic.

## 2021-04-22 ENCOUNTER — Encounter (HOSPITAL_COMMUNITY): Payer: Self-pay

## 2021-04-22 NOTE — Telephone Encounter (Signed)
A message has been sent to heather vis mychart letting her know the patient does need to have this US done.

## 2021-04-24 ENCOUNTER — Ambulatory Visit (HOSPITAL_BASED_OUTPATIENT_CLINIC_OR_DEPARTMENT_OTHER): Payer: Medicare Other

## 2021-04-24 NOTE — Progress Notes (Signed)
[facility] Summary Report / Loop Recorder 

## 2021-04-27 ENCOUNTER — Ambulatory Visit (HOSPITAL_BASED_OUTPATIENT_CLINIC_OR_DEPARTMENT_OTHER)
Admission: RE | Admit: 2021-04-27 | Discharge: 2021-04-27 | Disposition: A | Discharge status: Home / Self Care | Payer: Medicare Other | Source: Ambulatory Visit | Attending: Family Medicine | Admitting: Family Medicine

## 2021-04-27 ENCOUNTER — Other Ambulatory Visit: Payer: Self-pay

## 2021-04-27 DIAGNOSIS — I6523 Occlusion and stenosis of bilateral carotid arteries: Secondary | ICD-10-CM | POA: Diagnosis not present

## 2021-04-27 DIAGNOSIS — R4781 Slurred speech: Secondary | ICD-10-CM | POA: Diagnosis not present

## 2021-05-07 ENCOUNTER — Ambulatory Visit (INDEPENDENT_AMBULATORY_CARE_PROVIDER_SITE_OTHER): Payer: Medicare Other

## 2021-05-07 DIAGNOSIS — I480 Paroxysmal atrial fibrillation: Secondary | ICD-10-CM | POA: Diagnosis not present

## 2021-05-11 LAB — CUP PACEART REMOTE DEVICE CHECK
Date Time Interrogation Session: 20220807040948
Implantable Pulse Generator Implant Date: 20200928

## 2021-05-14 ENCOUNTER — Ambulatory Visit: Payer: Medicare Other | Admitting: Internal Medicine

## 2021-05-20 ENCOUNTER — Encounter: Payer: Self-pay | Admitting: Internal Medicine

## 2021-05-20 ENCOUNTER — Telehealth: Payer: Self-pay

## 2021-05-20 ENCOUNTER — Ambulatory Visit: Payer: Medicare Other | Admitting: Internal Medicine

## 2021-05-20 VITALS — BP 124/88 | HR 93 | Ht 70.0 in | Wt 234.0 lb

## 2021-05-20 DIAGNOSIS — I480 Paroxysmal atrial fibrillation: Secondary | ICD-10-CM

## 2021-05-20 DIAGNOSIS — K529 Noninfective gastroenteritis and colitis, unspecified: Secondary | ICD-10-CM | POA: Diagnosis not present

## 2021-05-20 DIAGNOSIS — E512 Wernicke's encephalopathy: Secondary | ICD-10-CM

## 2021-05-20 DIAGNOSIS — Z800 Family history of malignant neoplasm of digestive organs: Secondary | ICD-10-CM

## 2021-05-20 DIAGNOSIS — Z7901 Long term (current) use of anticoagulants: Secondary | ICD-10-CM

## 2021-05-20 DIAGNOSIS — Z86010 Personal history of colonic polyps: Secondary | ICD-10-CM | POA: Diagnosis not present

## 2021-05-20 NOTE — Progress Notes (Signed)
Adam [name] [birth year] 70 y.o. 1951-08-10 [id number]  Assessment & Plan:   Encounter Diagnoses  Name Primary?   Family history of colon cancer in mother deceased age 64 Yes   Hx of adenomatous colonic polyps    Paroxysmal atrial fibrillation ([place])    Colitis    Wernicke encephalopathy    Long term current use of anticoagulant      Surveillance/screening colonoscopy will be scheduled.  Look for persistent colitis this has been unclear as to the cause but has not caused chronic symptoms.   Hold Eliquis 2 days prior we will clarify with cardiology.  Rare but real increased risk of stroke off Eliquis reviewed as well as routine risks of endoscopy.  The patient continues to drink regularly he understands that its not in his best interest.  Given his history of DTs I have recommended he not stop drinking for a long period of time prior to any procedure.  We did discuss that overall long-term it is best for him to eliminate alcohol and he understands that and he says he drinks a lot less than he used to.  I appreciate the opportunity to care for this patient. CC: [name], MD   Subjective:   Chief Complaint: History of colon polyps and family history of colon cancer  HPI 70 year old white man with a personal history of adenomatous colon polyps as well as a family history of colon cancer in his mother here to discuss repeat colonoscopy.  He takes Eliquis for paroxysmal atrial fibrillation.  He has a history of alcoholism and history of DTs.  Currently drinking about 2 scotch whiskey drinks each day.  He is having some constipation issues over the past several months.  Trying some laxatives with variable success.  1 over-the-counter laxative did not do much when he took 3 he had a "blowout".  Dulcolax has been along with stool softeners.  Denies any bleeding.  Last colonoscopy in 2017 and he had some nonspecific colitis in the left colon, he has a history of patchy colitis and  ileitis on a colonoscopy in [place] in 2014.   Allergies  Allergen Reactions   Xarelto [Rivaroxaban] Other (See Comments)    Patient stated he "ended up in the hospital with a rectal bleed"   Amoxicillin Rash and Other (See Comments)    RASH IN GROIN AREA Has patient had a PCN reaction causing immediate rash, facial/tongue/throat swelling, SOB or lightheadedness with hypotension: no Has patient had a PCN reaction causing severe rash involving mucus membranes or skin necrosis: no Has patient had a PCN reaction that required hospitalization: no Has patient had a PCN reaction occurring within the last 10 years: yes If all of the above answers are "NO", then may proceed with Cephalosporin use.    Augmentin [Amoxicillin-Pot Clavulanate] Rash and Other (See Comments)    RASH IN GROIN AREA   Azithromycin Rash and Other (See Comments)    RASH IN GROIN AREA   Clindamycin/Lincomycin Rash   Keflex [Cephalexin] Rash   Current Meds  Medication Sig   acetaminophen (TYLENOL) 325 MG tablet Take 325 mg by mouth every 8 (eight) hours as needed for moderate pain.   allopurinol (ZYLOPRIM) 300 MG tablet TAKE 1 TABLET(300 MG) BY MOUTH DAILY   amiodarone (PACERONE) 200 MG tablet Take 1 tablet (200 mg total) by mouth daily.   B COMPLEX-C-FOLIC ACID PO Take 1 tablet by mouth daily.   Blood Glucose Monitoring Suppl (ONETOUCH VERIO) w/Device KIT Use 1-4  times daily as needed/directed DX E11.9   DULoxetine (CYMBALTA) 60 MG capsule TAKE 1 CAPSULE(60 MG) BY MOUTH DAILY   ELIQUIS 5 MG TABS tablet TAKE 1 TABLET(5 MG) BY MOUTH TWICE DAILY   Fluticasone-Umeclidin-Vilant (TRELEGY ELLIPTA) 100-62.5-25 MCG/INH AEPB Inhale 1 puff into the lungs daily.   folic acid (FOLVITE) 1 MG tablet Take 1 tablet (1 mg total) by mouth daily.   gabapentin (NEURONTIN) 300 MG capsule Take 2 capsules at night as needed for restless leg symptoms   icosapent Ethyl (VASCEPA) 1 g capsule TAKE 2 CAPSULES(2 GRAMS) BY MOUTH TWICE DAILY    insulin glargine, 2 Unit Dial, (TOUJEO MAX SOLOSTAR) 300 UNIT/ML Solostar Pen Inject 130 Units into the skin daily. (Patient taking differently: Inject 100 Units into the skin daily.)   Insulin Pen Needle (NOVOFINE PEN NEEDLE) 32G X 6 MM MISC Use 1-4 times daily as needed for insulin   JANUVIA 100 MG tablet TAKE 1 TABLET(100 MG) BY MOUTH DAILY   JARDIANCE 25 MG TABS tablet TAKE 1 TABLET(25 MG) BY MOUTH DAILY BEFORE AND BREAKFAST   Lancets (ONETOUCH ULTRASOFT) lancets Use 1-4 times daily as needed/directed  DX E11.9   Lidocaine HCl 4 % CREA Apply 1 application topically daily as needed (pain).   Magnesium Oxide 400 MG CAPS Take 1 capsule (400 mg total) by mouth daily.   metFORMIN (GLUCOPHAGE-XR) 500 MG 24 hr tablet Take 1 tablet (500 mg total) by mouth in the morning and at bedtime.   metoprolol tartrate (LOPRESSOR) 50 MG tablet Take 1 tablet (50 mg total) by mouth 2 (two) times daily.   montelukast (SINGULAIR) 10 MG tablet TAKE 1 TABLET BY MOUTH AT BEDTIME   Multiple Vitamin (MULTIVITAMIN WITH MINERALS) TABS tablet Take 1 tablet by mouth daily.   ONETOUCH VERIO test strip Use 1-4 times daily as directed/needed   DX E11.9   rosuvastatin (CRESTOR) 40 MG tablet Take 40 mg by mouth daily.   thiamine (VITAMIN B-1) 100 MG tablet Take 500 mg by mouth daily.   Past Medical History:  Diagnosis Date   Allergy    Anxiety    history of PTSD following CABG   Ascending aortic aneurysm ([place]) 01/31/2018   43 x 42 mm, pt unaware   Asthma    Cardiomegaly 10/17/2017   Colitis- colonoscopy 2014 07/13/2015   COPD (chronic obstructive pulmonary disease) ([place])    Coronary artery disease    x 6   Depression    Diabetes mellitus without complication ([place])    Family history of polyps in the colon    Finger dislocation    Left pinkie   GERD (gastroesophageal reflux disease)    Gout    H/O atrial fibrillation without current medication    following CABG with no documented episodes since then.   Heart  palpitations    Hx of adenomatous colonic polyps 08/12/2010   Hyperlipidemia    Hypertension    OA (osteoarthritis)    OSA (obstructive sleep apnea)    Mild, has not received CPAP yet   Prediabetes    RLS (restless legs syndrome)    Squamous cell carcinoma of scalp 2016   Moh's   Past Surgical History:  Procedure Laterality Date   ANKLE FRACTURE SURGERY Right 1991   APPENDECTOMY     ATRIAL FIBRILLATION ABLATION N/A 03/31/2018   Procedure: ATRIAL FIBRILLATION ABLATION;  Surgeon: [name], MD;  Location: [place];  Service: Cardiovascular;  Laterality: N/A;   CARDIAC ELECTROPHYSIOLOGY MAPPING AND ABLATION  CARDIOVERSION N/A 03/23/2021   Procedure: CARDIOVERSION;  Surgeon: Acie Fredrickson Wonda Cheng, MD;  Location: [facility] ENDOSCOPY;  Service: Cardiovascular;  Laterality: N/A;   COLONOSCOPY W/ BIOPSIES  2017   x7   CORONARY ANGIOPLASTY WITH STENT PLACEMENT     CORONARY ARTERY BYPASS GRAFT     FINGER SURGERY  04/2018   Small finger left hand   implantable loop recorder placement  07/02/2019   Medtronic Reveal [place] model [id number] (SN [id number]) implanted in office by Dr Rayann Heman   TEE WITHOUT CARDIOVERSION N/A 03/30/2018   Procedure: TRANSESOPHAGEAL ECHOCARDIOGRAM (TEE);  Surgeon: [name], MD;  Location: [place];  Service: Cardiovascular;  Laterality: N/A;   TEE WITHOUT CARDIOVERSION N/A 03/23/2021   Procedure: TRANSESOPHAGEAL ECHOCARDIOGRAM (TEE);  Surgeon: Acie Fredrickson Wonda Cheng, MD;  Location: [facility] END[facility];  Service: Cardiovascular;  Laterality: N/A;   TOTAL HIP ARTHROPLASTY Left    TOTAL HIP ARTHROPLASTY Right 09/12/2018   Procedure: TOTAL HIP ARTHROPLASTY ANTERIOR APPROACH;  Surgeon: [name], MD;  Location: [facility];  Service: Orthopedics;  Laterality: Right;  70 mins   Social History   Social History Narrative   Married, no children   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   Review of Systems As per HPI  Objective:   Physical Exam _0  124/88   Pulse 93   Ht 5' 10" (1.778 m)   Wt 234 lb (106.1 kg)   BMI 33.58 kg/m @  General:  NAD Eyes:   anicteric Lungs:  clear Heart::  S1S2 no rubs, murmurs or gallops Abdomen:  soft and nontender, BS+ Ext:   no edema, cyanosis or clubbing    Data Reviewed:  See HPI includes multiple recent hospitalizations in the last 2 years and ED visits as well.

## 2021-05-20 NOTE — Patient Instructions (Signed)
You have been scheduled for a colonoscopy. Please follow written instructions given to you at your visit today.  Please pick up your prep supplies at the pharmacy within the next 1-3 days. If you use inhalers (even only as needed), please bring them with you on the day of your procedure.   I appreciate the opportunity to care for you. Carl Gessner, MD, FACG 

## 2021-05-20 NOTE — Telephone Encounter (Signed)
[facility] Pre-operative Risk Assessment     Request for surgical clearance:     Endoscopy Procedure  What type of surgery is being performed?     colonoscopy  When is this surgery scheduled?     06/11/21  What type of clearance is required ?   Pharmacy  Are there any medications that need to be held prior to surgery and how long? Eliquis, 2 days  Practice name and name of physician performing surgery?      [facility]  What is your office phone and fax number?      Phone- [phone]  [phone]  Anesthesia type (None, local, MAC, general) ?       MAC

## 2021-05-21 NOTE — Telephone Encounter (Signed)
I left him a detailed message on his mobile # with instructions and told him to call me back so I can confirm he got my message.

## 2021-05-21 NOTE — Telephone Encounter (Signed)
Patient with diagnosis of afib on Eliquis for anticoagulation.    Procedure: colonoscopy Date of procedure: 06/11/21  CHA2DS2-VASc Score = 4  This indicates a 4.8% annual risk of stroke. The patient's score is based upon: CHF History: No HTN History: Yes Diabetes History: Yes Stroke History: No Vascular Disease History: Yes Age Score: 1 Gender Score: 0  CrCl >148m/min Platelet count 181K  Per office protocol, patient can hold Eliquis for 2 days prior to procedure.

## 2021-05-21 NOTE — Telephone Encounter (Signed)
Left a detailed voice message for patient on his mobile informing him to hold his Eliquis for 2 days prior to upcoming procedure and to give our office a call back to let us know he received the message or if he has any questions.

## 2021-05-21 NOTE — Telephone Encounter (Signed)
   Name: Adam [name]  DOB: April 08, 1951  MRN: [id number]   Primary Cardiologist: [name], MD  Chart reviewed as part of pre-operative protocol coverage. We have been asked to hold eliquis for colonoscopy.   Per our clinical pharmacist: Per office protocol, patient can hold Eliquis for 2 days prior to procedure.  I will route this recommendation to the requesting party via Epic fax function and remove from pre-op pool. Please call with questions.  [place], PA 05/21/2021, 12:01 PM

## 2021-05-22 ENCOUNTER — Other Ambulatory Visit: Payer: Self-pay | Admitting: Family Medicine

## 2021-05-22 NOTE — Telephone Encounter (Signed)
Please advise. Rx is not on the current med list 

## 2021-05-22 NOTE — Telephone Encounter (Signed)
This request was from [facility]. I have denied the request with a message that the patient is no longer taking this medication.

## 2021-05-29 NOTE — Progress Notes (Signed)
[facility] Summary Report / Loop Recorder 

## 2021-06-01 ENCOUNTER — Telehealth: Payer: Self-pay

## 2021-06-01 NOTE — Telephone Encounter (Signed)
I have left him a detailed message to call me back to make sure he got our earlier messages about holding the Eliquis 2 days prior to his procedure.

## 2021-06-01 NOTE — Telephone Encounter (Signed)
-----   Message from Machi Whittaker E [place], [place] sent at 05/20/2021 12:41 PM EDT ----- Do we have eliquis clearance for colon for 06/11/21?

## 2021-06-02 ENCOUNTER — Other Ambulatory Visit: Payer: Self-pay | Admitting: Family Medicine

## 2021-06-04 ENCOUNTER — Other Ambulatory Visit: Payer: Self-pay | Admitting: Family Medicine

## 2021-06-09 ENCOUNTER — Ambulatory Visit (INDEPENDENT_AMBULATORY_CARE_PROVIDER_SITE_OTHER): Payer: Medicare Other

## 2021-06-09 DIAGNOSIS — I480 Paroxysmal atrial fibrillation: Secondary | ICD-10-CM | POA: Diagnosis not present

## 2021-06-11 ENCOUNTER — Ambulatory Visit (AMBULATORY_SURGERY_CENTER): Payer: Medicare Other | Admitting: Internal Medicine

## 2021-06-11 ENCOUNTER — Encounter: Payer: Self-pay | Admitting: Internal Medicine

## 2021-06-11 VITALS — BP 90/44 | HR 74 | Temp 95.3°F | Resp 17 | Ht 70.0 in | Wt 234.0 lb

## 2021-06-11 DIAGNOSIS — Z800 Family history of malignant neoplasm of digestive organs: Secondary | ICD-10-CM

## 2021-06-11 DIAGNOSIS — Z86010 Personal history of colonic polyps: Secondary | ICD-10-CM

## 2021-06-11 DIAGNOSIS — D123 Benign neoplasm of transverse colon: Secondary | ICD-10-CM

## 2021-06-11 HISTORY — PX: COLONOSCOPY: SHX174

## 2021-06-11 MED ORDER — SODIUM CHLORIDE 0.9 % IV SOLN: 500.0000 mL | Freq: Once | INTRAVENOUS | Status: DC

## 2021-06-11 NOTE — Progress Notes (Signed)
VS taken by DT 

## 2021-06-11 NOTE — Progress Notes (Signed)
Called to room to assist during endoscopic procedure.  Patient ID and intended procedure confirmed with present staff. Received instructions for my participation in the procedure from the performing physician.  

## 2021-06-11 NOTE — Progress Notes (Signed)
History and Physical Interval Note:  06/11/2021 10:51 AM  Adam [name]  has presented today for endoscopic procedure(s), with the diagnosis of  Encounter Diagnoses  Name Primary?   Family history of colon cancer in mother Yes   Hx of adenomatous colonic polyps   .  The various methods of evaluation and treatment have been discussed with the patient and/or family. After consideration of risks, benefits and other options for treatment, the patient has consented to  the endoscopic procedure(s).   The patient's history has been reviewed, patient examined, no change in status, stable for endoscopic procedure(s).  I have reviewed the patient's chart and labs.  Questions were answered to the patient's satisfaction.     Gatha Mayer, MD, [facility]

## 2021-06-11 NOTE — Patient Instructions (Addendum)
I found and removed 3 small polyps that look benign.  I will let you know pathology results and when to have another routine colonoscopy by mail and/or My Chart.  Restart Eliquis tomorrow, Friday 06/12/21.  Start taking miralax 1 capful once or twice daily to prevent constipation.  I appreciate the opportunity to care for you. Gatha Mayer, MD, [facility]   Handouts given for polyps and diverticulosis.  YOU HAD AN ENDOSCOPIC PROCEDURE TODAY AT [facility]:   Refer to the procedure report that was given to you for any specific questions about what was found during the examination.  If the procedure report does not answer your questions, please call your gastroenterologist to clarify.  If you requested that your care partner not be given the details of your procedure findings, then the procedure report has been included in a sealed envelope for you to review at your convenience later.  YOU SHOULD EXPECT: Some feelings of bloating in the abdomen. Passage of more gas than usual.  Walking can help get rid of the air that was put into your GI tract during the procedure and reduce the bloating. If you had a lower endoscopy (such as a colonoscopy or flexible sigmoidoscopy) you may notice spotting of blood in your stool or on the toilet paper. If you underwent a bowel prep for your procedure, you may not have a normal bowel movement for a few days.  Please Note:  You might notice some irritation and congestion in your nose or some drainage.  This is from the oxygen used during your procedure.  There is no need for concern and it should clear up in a day or so.  SYMPTOMS TO REPORT IMMEDIATELY:  Following lower endoscopy (colonoscopy or flexible sigmoidoscopy):  Excessive amounts of blood in the stool  Significant tenderness or worsening of abdominal pains  Swelling of the abdomen that is new, acute  Fever of 100F or higher  For urgent or emergent issues, a gastroenterologist can be  reached at any hour by calling [phone]. Do not use MyChart messaging for urgent concerns.    DIET:  We do recommend a small meal at first, but then you may proceed to your regular diet.  Drink plenty of fluids but you should avoid alcoholic beverages for 24 hours.  ACTIVITY:  You should plan to take it easy for the rest of today and you should NOT DRIVE or use heavy machinery until tomorrow (because of the sedation medicines used during the test).    FOLLOW UP: Our staff will call the number listed on your records 48-72 hours following your procedure to check on you and address any questions or concerns that you may have regarding the information given to you following your procedure. If we do not reach you, we will leave a message.  We will attempt to reach you two times.  During this call, we will ask if you have developed any symptoms of COVID 19. If you develop any symptoms (ie: fever, flu-like symptoms, shortness of breath, cough etc.) before then, please call [phone].  If you test positive for Covid 19 in the 2 weeks post procedure, please call and report this information to [place].    If any biopsies were taken you will be contacted by phone or by letter within the next 1-3 weeks.  Please call us at [phone] if you have not heard about the biopsies in 3 weeks.    SIGNATURES/CONFIDENTIALITY: You and/or your care  partner have signed paperwork which will be entered into your electronic medical record.  These signatures attest to the fact that that the information above on your After Visit Summary has been reviewed and is understood.  Full responsibility of the confidentiality of this discharge information lies with you and/or your care-partner.  

## 2021-06-11 NOTE — Progress Notes (Signed)
PT taken to PACU. Monitors in place. VSS. Report given to RN. 

## 2021-06-11 NOTE — Op Note (Signed)
Adam [name] Patient Name: Adam [name] Procedure Date: 06/11/2021 10:26 AM MRN: [id number] Endoscopist: Gatha Mayer , MD Age: 70 Referring MD:  Date of Birth: 1951/07/27 Gender: Male Account #: [id number] Procedure:                Colonoscopy Indications:              Surveillance: Personal history of adenomatous                            polyps on last colonoscopy 5 years ago Medicines:                Propofol per Anesthesia, Monitored Anesthesia Care Procedure:                Pre-Anesthesia Assessment:                           - Prior to the procedure, a History and Physical                            was performed, and patient medications and                            allergies were reviewed. The patient's tolerance of                            previous anesthesia was also reviewed. The risks                            and benefits of the procedure and the sedation                            options and risks were discussed with the patient.                            All questions were answered, and informed consent                            was obtained. Prior Anticoagulants: The patient                            last took Eliquis (apixaban) 1 day prior to the                            procedure. ASA Grade Assessment: III - A patient                            with severe systemic disease. After reviewing the                            risks and benefits, the patient was deemed in                            satisfactory condition to undergo the procedure.  After obtaining informed consent, the colonoscope                            was passed under direct vision. Throughout the                            procedure, the patient's blood pressure, pulse, and                            oxygen saturations were monitored continuously. The                            CF [id number] was introduced through the anus                             and advanced to the the cecum, identified by                            appendiceal orifice and ileocecal valve. The                            colonoscopy was somewhat difficult due to                            significant looping. Successful completion of the                            procedure was aided by applying abdominal pressure.                            The patient tolerated the procedure well. The                            quality of the bowel preparation was adequate. The                            ileocecal valve, appendiceal orifice, and rectum                            were photographed. The bowel preparation used was                            Miralax via split dose instruction. Scope In: 10:53:08 AM Scope Out: 11:08:57 AM Scope Withdrawal Time: 0 hours 10 minutes 11 seconds  Total Procedure Duration: 0 hours 15 minutes 49 seconds  Findings:                 The digital rectal exam findings include decreased                            sphincter tone.                           Three sessile polyps were found in the transverse  colon. The polyps were 3 to 6 mm in size. These                            polyps were removed with a cold snare. Resection                            and retrieval were complete. Verification of                            patient identification for the specimen was done.                            Estimated blood loss was minimal.                           Multiple diverticula were found in the sigmoid                            colon and descending colon.                           The exam was otherwise without abnormality on                            direct and retroflexion views. Complications:            No immediate complications. Estimated Blood Loss:     Estimated blood loss was minimal. Impression:               - Decreased sphincter tone found on digital rectal                            exam.                            - Three 3 to 6 mm polyps in the transverse colon,                            removed with a cold snare. Resected and retrieved.                           - Diverticulosis in the sigmoid colon and in the                            descending colon.                           - The examination was otherwise normal on direct                            and retroflexion views.                           - Personal history of adenomatous polyps and FHx  CRCA mom. Recommendation:           - Patient has a contact number available for                            emergencies. The signs and symptoms of potential                            delayed complications were discussed with the                            patient. Return to normal activities tomorrow.                            Written discharge instructions were provided to the                            patient.                           - Resume Eliquis (apixaban) at prior dose tomorrow.                           - No recommendation at this time regarding repeat                            colonoscopy due to age.                           - Resume previous diet. Gatha Mayer, MD 06/11/2021 11:16:19 AM This report has been signed electronically.

## 2021-06-12 LAB — CUP PACEART REMOTE DEVICE CHECK
Date Time Interrogation Session: 20220909041350
Implantable Pulse Generator Implant Date: 20200928

## 2021-06-15 ENCOUNTER — Telehealth: Payer: Self-pay

## 2021-06-15 NOTE — Telephone Encounter (Signed)
  Follow up Call-  Call back number 06/11/2021  Post procedure Call Back phone  # [phone]  Permission to leave phone message Yes  Some recent data might be hidden     Patient questions:  Do you have a fever, pain , or abdominal swelling? No. Pain Score  0 *  Have you tolerated food without any problems? Yes.    Have you been able to return to your normal activities? Yes.    Do you have any questions about your discharge instructions: Diet   No. Medications  No. Follow up visit  No.  Do you have questions or concerns about your Care? No.  Actions: * If pain score is 4 or above: No action needed, pain <4.  Have you developed a fever since your procedure? No   2.   Have you had an respiratory symptoms (SOB or cough) since your procedure? No   3.   Have you tested positive for COVID 19 since your procedure no   4.   Have you had any family members/close contacts diagnosed with the COVID 19 since your procedure?  No    If yes to any of these questions please route to Joylene John, RN and Joella Prince, RN

## 2021-06-17 NOTE — Progress Notes (Signed)
[facility] Summary Report / Loop Recorder 

## 2021-06-19 ENCOUNTER — Encounter: Payer: Self-pay | Admitting: Internal Medicine

## 2021-06-24 ENCOUNTER — Encounter: Payer: Self-pay | Admitting: Family Medicine

## 2021-06-24 ENCOUNTER — Other Ambulatory Visit: Payer: Self-pay

## 2021-06-24 ENCOUNTER — Ambulatory Visit (INDEPENDENT_AMBULATORY_CARE_PROVIDER_SITE_OTHER): Payer: Medicare Other | Admitting: Family Medicine

## 2021-06-24 VITALS — BP 126/72 | HR 79 | Temp 97.8°F | Ht 70.0 in | Wt 239.9 lb

## 2021-06-24 DIAGNOSIS — L404 Guttate psoriasis: Secondary | ICD-10-CM | POA: Diagnosis not present

## 2021-06-24 DIAGNOSIS — E1142 Type 2 diabetes mellitus with diabetic polyneuropathy: Secondary | ICD-10-CM | POA: Diagnosis not present

## 2021-06-24 DIAGNOSIS — R4781 Slurred speech: Secondary | ICD-10-CM | POA: Diagnosis not present

## 2021-06-24 DIAGNOSIS — F1010 Alcohol abuse, uncomplicated: Secondary | ICD-10-CM | POA: Diagnosis not present

## 2021-06-24 LAB — POCT GLYCOSYLATED HEMOGLOBIN (HGB A1C): Hemoglobin A1C: 6.4 % — AB (ref 4.0–5.6)

## 2021-06-24 NOTE — Progress Notes (Signed)
Established Patient Office Visit  Subjective:  Patient ID: Adam [name], male    DOB: 04-Feb-1951  Age: 70 y.o. MRN: [id number]  CC:  Chief Complaint  Patient presents with   Follow-up    HPI Adam [name] presents for medical follow-up.  His chronic problems include history of obesity, hypertension, CAD, atrial fibrillation, COPD, hyperlipidemia, type 2 diabetes, longstanding alcohol abuse.  Unfortunately, still abusing alcohol and drinking about 8 ounces of scotch per day.  His wife states that he is intoxicated most days.  He developed some slurred speech last summer.  Went to the ER and had MRI scan in June which showed no evidence for acute stroke.  There was some question of [name] though he did not have classic clinical findings of this.  He had developed acute left-sided facial droop and slurring of speech.  He has some progressive speech difficulties since then.  Previous concerns for Wernicke Korsakoff syndrome.  He does take multivitamin with thiamine regularly.  He has type 2 diabetes.  Is on combination of oral medications and high-dose Toujeo insulin.  He had recent A1c 5.7% and we had him scale back his Toujeo.  His blood sugars are checked infrequently this time.  Denies any recent hypoglycemic symptoms.  He has new skin rash legs bilaterally.  Nonpruritic.  Nonpainful.  No known history of psoriasis.  Continues to have balance issues.  He has history of chronic neuropathy.  He has a cane and walker at home but does not use these consistently.  Has had multiple falls.  Past Medical History:  Diagnosis Date   Alcohol withdrawal ([place]) 10/24/2019   Allergy    Anxiety    history of PTSD following CABG   Ascending aortic aneurysm ([place]) 01/31/2018   43 x 42 mm, pt unaware   Asthma    Cardiomegaly 10/17/2017   Colitis- colonoscopy 2014 07/13/2015   COPD (chronic obstructive pulmonary disease) ([place])    Coronary artery disease    x 6   Depression    Diabetes  mellitus without complication ([place])    Family history of polyps in the colon    Finger dislocation    Left pinkie   GERD (gastroesophageal reflux disease)    Gout    H/O atrial fibrillation without current medication    following CABG with no documented episodes since then.   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    Past Surgical History:  Procedure Laterality Date   ANKLE FRACTURE SURGERY Right 1991   APPENDECTOMY     ATRIAL FIBRILLATION ABLATION N/A 03/31/2018   Procedure: ATRIAL FIBRILLATION ABLATION;  Surgeon: [name], MD;  Location: [facility];  Service: Cardiovascular;  Laterality: N/A;   CARDIAC ELECTROPHYSIOLOGY MAPPING AND ABLATION     CARDIOVERSION N/A 03/23/2021   Procedure: CARDIOVERSION;  Surgeon: Acie Fredrickson Wonda Cheng, MD;  Location: [facility];  Service: Cardiovascular;  Laterality: N/A;   COLONOSCOPY  06/11/2021   COLONOSCOPY W/ BIOPSIES  2017   x7   CORONARY ANGIOPLASTY WITH STENT PLACEMENT     CORONARY ARTERY BYPASS GRAFT     FINGER SURGERY  04/2018  Small finger left hand   implantable loop recorder placement  07/02/2019   Medtronic Reveal [place] model LNQ11 (SN [id number]) implanted in office by Dr Rayann Heman   TEE WITHOUT CARDIOVERSION N/A 03/30/2018   Procedure: TRANSESOPHAGEAL ECHOCARDIOGRAM (TEE);  Surgeon: [name], MD;  Location: [facility];  Service: Cardiovascular;  Laterality: N/A;   TEE WITHOUT CARDIOVERSION N/A 03/23/2021   Procedure: TRANSESOPHAGEAL ECHOCARDIOGRAM (TEE);  Surgeon: Acie Fredrickson Wonda Cheng, MD;  [facility];  Service: Cardiovascular;  Laterality: N/A;   TOTAL HIP ARTHROPLASTY Left    TOTAL HIP ARTHROPLASTY Right  09/12/2018   Procedure: TOTAL HIP ARTHROPLASTY ANTERIOR APPROACH;  Surgeon: [name], MD;  Location: [facility];  Service: Orthopedics;  Laterality: Right;  70 mins    Family History  Problem Relation Age of Onset   Colon cancer Mother    Cancer Father        UNKNOWN TYPE   Heart disease Paternal Grandmother    Heart disease Paternal Grandfather    Esophageal cancer Neg Hx    Rectal cancer Neg Hx    Stomach cancer Neg Hx     Social History   Socioeconomic History   Marital status: Married    Spouse name: Not on file   Number of children: 0   Years of education: Not on file   Highest education level: Not on file  Occupational History   Occupation: retired  Tobacco Use   Smoking status: Former    Packs/day: 1.00    Years: 7.00    Pack years: 7.00    Types: Cigarettes    Quit date: 10/04/1977    Years since quitting: 43.7   Smokeless tobacco: Never   Tobacco comments:    never smoked over 1 pack   Vaping Use   Vaping Use: Never used  Substance and Sexual Activity   Alcohol use: Yes    Alcohol/week: 3.0 - 4.0 standard drinks    Types: 1 Glasses of wine, 2 - 3 Shots of liquor per week    Comment: DAILY   Drug use: No    Comment: Smoked Marijuana back in 1970s   Sexual activity: Yes  Other Topics Concern   Not on file  Social History Narrative   Married, no children   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 - returned to [employer] from [place] 2014   Enjoys bird watching, reading historical books about WWII, watching tv   Social Determinants of Radio broadcast assistant Strain: Low Risk    Difficulty of Paying Living Expenses: Not hard at [employer] Insecurity: Not on file  Transportation Needs: No Transportation Needs   Lack of Transportation (Medical): No   Lack of Transportation (Non-Medical): No  Physical Activity: Inactive   Days of Exercise per Week: 0 days   Minutes of Exercise per Session: 0 min  Stress: No Stress Concern  Present   Feeling of Stress : Not at all  Social Connections: Moderately Isolated   Frequency of Communication with Friends and Family: More than three times a week   Frequency of Social Gatherings with Friends and Family: Never   Attends Religious Services: Never   Marine scientist or Organizations: No   Attends Music therapist: Never   Marital Status: Married  Human resources officer Violence: Not At Risk   Fear of Current or Ex-Partner: No   Emotionally Abused: No   Physically Abused: No   Sexually Abused:  No    Outpatient Medications Prior to Visit  Medication Sig Dispense Refill   acetaminophen (TYLENOL) 325 MG tablet Take 325 mg by mouth every 8 (eight) hours as needed for moderate pain.     allopurinol (ZYLOPRIM) 300 MG tablet TAKE 1 TABLET(300 MG) BY MOUTH DAILY 90 tablet 3   amiodarone (PACERONE) 200 MG tablet Take 1 tablet (200 mg total) by mouth daily. 90 tablet 1   B COMPLEX-C-FOLIC ACID PO Take 1 tablet by mouth daily.     Blood Glucose Monitoring Suppl (ONETOUCH VERIO) w/Device KIT Use 1-4 times daily as needed/directed DX E11.9 1 kit 0   divalproex (DEPAKOTE SPRINKLE) 125 MG capsule Take 125 mg by mouth 3 (three) times daily.     DULoxetine (CYMBALTA) 60 MG capsule TAKE 1 CAPSULE(60 MG) BY MOUTH DAILY 90 capsule 1   ELIQUIS 5 MG TABS tablet TAKE 1 TABLET(5 MG) BY MOUTH TWICE DAILY 180 tablet 1   Fluticasone-Umeclidin-Vilant (TRELEGY ELLIPTA) 100-62.5-25 MCG/INH AEPB Inhale 1 puff into the lungs daily. 60 each 3   gabapentin (NEURONTIN) 300 MG capsule Take 2 capsules at night as needed for restless leg symptoms 180 capsule 3   icosapent Ethyl (VASCEPA) 1 g capsule TAKE 2 CAPSULES(2 GRAMS) BY MOUTH TWICE DAILY 180 capsule 3   insulin glargine, 2 Unit Dial, (TOUJEO MAX SOLOSTAR) 300 UNIT/ML Solostar Pen Inject 130 Units into the skin daily. (Patient taking differently: Inject 100 Units into the skin daily.) 9 mL 3   Insulin Pen Needle (NOVOFINE PEN NEEDLE) 32G X  6 MM MISC Use 1-4 times daily as needed for insulin 100 each 3   JANUVIA 100 MG tablet TAKE 1 TABLET(100 MG) BY MOUTH DAILY 90 tablet 1   JARDIANCE 25 MG TABS tablet TAKE 1 TABLET(25 MG) BY MOUTH DAILY BEFORE AND BREAKFAST 90 tablet 1   Lancets (ONETOUCH ULTRASOFT) lancets Use 1-4 times daily as needed/directed  DX E11.9 200 each 12   Lidocaine HCl 4 % CREA Apply 1 application topically daily as needed (pain).     Magnesium Oxide 400 MG CAPS Take 1 capsule (400 mg total) by mouth daily. 90 capsule 1   metFORMIN (GLUCOPHAGE-XR) 500 MG 24 hr tablet Take 1 tablet (500 mg total) by mouth in the morning and at bedtime. 180 tablet 3   montelukast (SINGULAIR) 10 MG tablet TAKE 1 TABLET BY MOUTH AT BEDTIME 90 tablet 3   Multiple Vitamin (MULTIVITAMIN WITH MINERALS) TABS tablet Take 1 tablet by mouth daily.     ONETOUCH VERIO test strip Use 1-4 times daily as directed/needed   DX E11.9 200 each 12   rosuvastatin (CRESTOR) 40 MG tablet Take 40 mg by mouth daily.     thiamine (VITAMIN B-1) 100 MG tablet Take 500 mg by mouth daily.     metoprolol tartrate (LOPRESSOR) 50 MG tablet Take 1 tablet (50 mg total) by mouth 2 (two) times daily.     No facility-administered medications prior to visit.    Allergies  Allergen Reactions   Xarelto [Rivaroxaban] Other (See Comments)    Patient stated he "ended up in the hospital with a rectal bleed"   Amoxicillin Rash and Other (See Comments)    RASH IN GROIN AREA Has patient had a PCN reaction causing immediate rash, facial/tongue/throat swelling, SOB or lightheadedness with hypotension: no Has patient had a PCN reaction causing severe rash involving mucus membranes or skin necrosis: no Has patient had a PCN reaction that required hospitalization: no Has patient had a  PCN reaction occurring within the last 10 years: yes If all of the above answers are "NO", then may proceed with Cephalosporin use.    Augmentin [Amoxicillin-Pot Clavulanate] Rash and Other (See  Comments)    RASH IN GROIN AREA   Azithromycin Rash and Other (See Comments)    RASH IN GROIN AREA   Clindamycin/Lincomycin Rash   Keflex [Cephalexin] Rash    ROS Review of Systems  Constitutional:  Negative for chills and fever.  Respiratory:  Negative for cough.   Cardiovascular:  Negative for chest pain.  Gastrointestinal:  Negative for abdominal pain.  Genitourinary:  Negative for dysuria.  Neurological:  Positive for dizziness and speech difficulty. Negative for seizures and syncope.  Psychiatric/Behavioral:  Negative for confusion.      Objective:    Physical Exam Vitals reviewed.  Constitutional:      Appearance: Normal appearance.  Cardiovascular:     Rate and Rhythm: Normal rate.  Pulmonary:     Effort: Pulmonary effort is normal.     Breath sounds: Normal breath sounds.  Musculoskeletal:     Right lower leg: No edema.     Left lower leg: No edema.  Skin:    Comments: He has scattered rash on both legs.  Guttate lesions with erythematous base and thick white scaly surface  Neurological:     Mental Status: He is alert.     Comments: Patient has obvious slurred speech but able to comprehend his words without difficulty.  His comprehension of speech is also good.  Mild left facial droop.  No focal weakness upper or lower extremities.  Cerebellar stable by finger-nose testing    BP 126/72 (BP Location: Left Arm, Patient Position: Sitting, Cuff Size: Normal)   Pulse 79   Temp 97.8 F (36.6 C) (Oral)   Ht '5\' 10"'  (1.778 m)   Wt 239 lb 14.4 oz (108.8 kg)   SpO2 96%   BMI 34.42 kg/m  Wt Readings from Last 3 Encounters:  06/24/21 239 lb 14.4 oz (108.8 kg)  06/11/21 234 lb (106.1 kg)  05/20/21 234 lb (106.1 kg)     Health Maintenance Due  Topic Date Due   TETANUS/TDAP  10/07/2019   FOOT EXAM  09/23/2020   COVID-19 Vaccine (4 - Booster for Pfizer series) 12/21/2020   INFLUENZA VACCINE  05/04/2021    There are no preventive care reminders to display for  this patient.  Lab Results  Component Value Date   TSH 2.034 03/20/2021   Lab Results  Component Value Date   WBC 10.5 04/05/2021   HGB 13.4 04/05/2021   HCT 40.5 04/05/2021   MCV 95.5 04/05/2021   PLT 181 04/05/2021   Lab Results  Component Value Date   NA 140 04/05/2021   K 3.2 (L) 04/05/2021   CO2 20 (L) 04/05/2021   GLUCOSE 138 (H) 04/05/2021   BUN 7 (L) 04/05/2021   CREATININE 0.69 04/05/2021   BILITOT 1.3 (H) 04/05/2021   ALKPHOS 42 04/05/2021   AST 63 (H) 04/05/2021   ALT 68 (H) 04/05/2021   PROT 7.0 04/05/2021   ALBUMIN 3.7 04/05/2021   CALCIUM 8.6 (L) 04/05/2021   ANIONGAP 17 (H) 04/05/2021   EGFR 96 12/08/2020   GFR 75.74 12/03/2019   Lab Results  Component Value Date   CHOL 137 09/23/2020   Lab Results  Component Value Date   HDL 40 09/23/2020   Lab Results  Component Value Date   LDLCALC 64 09/23/2020   Lab Results  Component Value Date   TRIG 280 (H) 09/23/2020   Lab Results  Component Value Date   CHOLHDL 3.4 09/23/2020   Lab Results  Component Value Date   HGBA1C 6.4 (A) 06/24/2021      Assessment & Plan:   #1 type 2 diabetes.  A1c today stable at 6.4%.  Would not push for any more aggressive control with his substantial risk for hypoglycemia. -Recheck in 3 months  #2 slurred speech.  Recent MRI back in June did not show any acute CVA. -Set up neurology referral for further evaluation  #3 new skin rash lower extremities.  Clinically, this looks like guttate psoriasis.  We offered topical treatment with steroids but he declines at this time.  Be in touch for any progressive lesions  #4 long hx of ETOH abuse.   He is aware of adverse effects of continued use.  He has very little motivation to change at this time.   No orders of the defined types were placed in this encounter.   Follow-up: Return in about 3 months (around 09/23/2021).    Carolann Littler, MD

## 2021-06-25 ENCOUNTER — Encounter: Payer: Self-pay | Admitting: Family Medicine

## 2021-06-26 NOTE — Telephone Encounter (Signed)
I reviewed his chart, and I think this can safely wait until 08-06-21. This is actually fairly quick to get in with a neurologist!

## 2021-06-30 ENCOUNTER — Other Ambulatory Visit: Payer: Self-pay | Admitting: Family Medicine

## 2021-07-03 ENCOUNTER — Ambulatory Visit: Payer: Medicare Other | Admitting: Family Medicine

## 2021-07-09 ENCOUNTER — Ambulatory Visit (INDEPENDENT_AMBULATORY_CARE_PROVIDER_SITE_OTHER): Payer: Medicare Other

## 2021-07-09 DIAGNOSIS — I480 Paroxysmal atrial fibrillation: Secondary | ICD-10-CM

## 2021-07-10 ENCOUNTER — Other Ambulatory Visit: Payer: Self-pay

## 2021-07-10 ENCOUNTER — Ambulatory Visit (INDEPENDENT_AMBULATORY_CARE_PROVIDER_SITE_OTHER): Payer: Medicare Other

## 2021-07-10 DIAGNOSIS — Z23 Encounter for immunization: Secondary | ICD-10-CM | POA: Diagnosis not present

## 2021-07-15 LAB — CUP PACEART REMOTE DEVICE CHECK
Date Time Interrogation Session: 20221012041405
Implantable Pulse Generator Implant Date: 20200928

## 2021-07-17 NOTE — Progress Notes (Signed)
[facility] Summary Report / Loop Recorder 

## 2021-07-22 ENCOUNTER — Ambulatory Visit (HOSPITAL_COMMUNITY): Payer: Medicare Other | Admitting: Nurse Practitioner

## 2021-07-30 ENCOUNTER — Encounter (HOSPITAL_COMMUNITY): Payer: Self-pay | Admitting: Nurse Practitioner

## 2021-07-30 ENCOUNTER — Ambulatory Visit (HOSPITAL_BASED_OUTPATIENT_CLINIC_OR_DEPARTMENT_OTHER)
Admission: RE | Admit: 2021-07-30 | Discharge: 2021-07-30 | Disposition: A | Discharge status: Home / Self Care | Payer: Medicare Other | Source: Ambulatory Visit | Attending: Nurse Practitioner | Admitting: Nurse Practitioner

## 2021-07-30 ENCOUNTER — Inpatient Hospital Stay (HOSPITAL_BASED_OUTPATIENT_CLINIC_OR_DEPARTMENT_OTHER)
Admission: EM | Admit: 2021-07-30 | Discharge: 2021-08-02 | DRG: 640 | Disposition: A | Discharge status: Home / Self Care | Payer: Medicare Other | Attending: Internal Medicine | Admitting: Internal Medicine

## 2021-07-30 ENCOUNTER — Other Ambulatory Visit: Payer: Self-pay

## 2021-07-30 ENCOUNTER — Encounter (HOSPITAL_BASED_OUTPATIENT_CLINIC_OR_DEPARTMENT_OTHER): Payer: Self-pay | Admitting: Emergency Medicine

## 2021-07-30 VITALS — BP 136/74 | HR 74 | Ht 70.0 in | Wt 234.2 lb

## 2021-07-30 DIAGNOSIS — J449 Chronic obstructive pulmonary disease, unspecified: Secondary | ICD-10-CM | POA: Diagnosis present

## 2021-07-30 DIAGNOSIS — F32A Depression, unspecified: Secondary | ICD-10-CM | POA: Diagnosis present

## 2021-07-30 DIAGNOSIS — F1010 Alcohol abuse, uncomplicated: Secondary | ICD-10-CM | POA: Diagnosis not present

## 2021-07-30 DIAGNOSIS — Z6833 Body mass index (BMI) 33.0-33.9, adult: Secondary | ICD-10-CM | POA: Diagnosis not present

## 2021-07-30 DIAGNOSIS — Z955 Presence of coronary angioplasty implant and graft: Secondary | ICD-10-CM

## 2021-07-30 DIAGNOSIS — I2510 Atherosclerotic heart disease of native coronary artery without angina pectoris: Secondary | ICD-10-CM | POA: Diagnosis present

## 2021-07-30 DIAGNOSIS — I4819 Other persistent atrial fibrillation: Secondary | ICD-10-CM | POA: Diagnosis not present

## 2021-07-30 DIAGNOSIS — D68318 Other hemorrhagic disorder due to intrinsic circulating anticoagulants, antibodies, or inhibitors: Secondary | ICD-10-CM | POA: Diagnosis not present

## 2021-07-30 DIAGNOSIS — R296 Repeated falls: Secondary | ICD-10-CM | POA: Diagnosis present

## 2021-07-30 DIAGNOSIS — G2581 Restless legs syndrome: Secondary | ICD-10-CM | POA: Diagnosis present

## 2021-07-30 DIAGNOSIS — I712 Thoracic aortic aneurysm, without rupture, unspecified: Secondary | ICD-10-CM | POA: Diagnosis present

## 2021-07-30 DIAGNOSIS — E512 Wernicke's encephalopathy: Secondary | ICD-10-CM | POA: Diagnosis not present

## 2021-07-30 DIAGNOSIS — G4733 Obstructive sleep apnea (adult) (pediatric): Secondary | ICD-10-CM | POA: Diagnosis not present

## 2021-07-30 DIAGNOSIS — F4310 Post-traumatic stress disorder, unspecified: Secondary | ICD-10-CM | POA: Diagnosis present

## 2021-07-30 DIAGNOSIS — U071 COVID-19: Secondary | ICD-10-CM | POA: Diagnosis not present

## 2021-07-30 DIAGNOSIS — E876 Hypokalemia: Principal | ICD-10-CM | POA: Diagnosis present

## 2021-07-30 DIAGNOSIS — Z7901 Long term (current) use of anticoagulants: Secondary | ICD-10-CM | POA: Insufficient documentation

## 2021-07-30 DIAGNOSIS — K219 Gastro-esophageal reflux disease without esophagitis: Secondary | ICD-10-CM | POA: Diagnosis present

## 2021-07-30 DIAGNOSIS — J309 Allergic rhinitis, unspecified: Secondary | ICD-10-CM | POA: Diagnosis present

## 2021-07-30 DIAGNOSIS — E1159 Type 2 diabetes mellitus with other circulatory complications: Secondary | ICD-10-CM | POA: Diagnosis present

## 2021-07-30 DIAGNOSIS — M1990 Unspecified osteoarthritis, unspecified site: Secondary | ICD-10-CM | POA: Diagnosis present

## 2021-07-30 DIAGNOSIS — I480 Paroxysmal atrial fibrillation: Secondary | ICD-10-CM

## 2021-07-30 DIAGNOSIS — R0602 Shortness of breath: Secondary | ICD-10-CM

## 2021-07-30 DIAGNOSIS — Z87891 Personal history of nicotine dependence: Secondary | ICD-10-CM

## 2021-07-30 DIAGNOSIS — D6869 Other thrombophilia: Secondary | ICD-10-CM

## 2021-07-30 DIAGNOSIS — F419 Anxiety disorder, unspecified: Secondary | ICD-10-CM | POA: Diagnosis present

## 2021-07-30 DIAGNOSIS — R4781 Slurred speech: Secondary | ICD-10-CM | POA: Insufficient documentation

## 2021-07-30 DIAGNOSIS — M109 Gout, unspecified: Secondary | ICD-10-CM | POA: Diagnosis present

## 2021-07-30 DIAGNOSIS — Z7984 Long term (current) use of oral hypoglycemic drugs: Secondary | ICD-10-CM

## 2021-07-30 DIAGNOSIS — Z951 Presence of aortocoronary bypass graft: Secondary | ICD-10-CM

## 2021-07-30 DIAGNOSIS — Z7141 Alcohol abuse counseling and surveillance of alcoholic: Secondary | ICD-10-CM

## 2021-07-30 DIAGNOSIS — Z794 Long term (current) use of insulin: Secondary | ICD-10-CM

## 2021-07-30 DIAGNOSIS — E785 Hyperlipidemia, unspecified: Secondary | ICD-10-CM | POA: Diagnosis not present

## 2021-07-30 DIAGNOSIS — I152 Hypertension secondary to endocrine disorders: Secondary | ICD-10-CM | POA: Diagnosis present

## 2021-07-30 DIAGNOSIS — D6859 Other primary thrombophilia: Secondary | ICD-10-CM | POA: Diagnosis present

## 2021-07-30 DIAGNOSIS — Z881 Allergy status to other antibiotic agents status: Secondary | ICD-10-CM

## 2021-07-30 DIAGNOSIS — Z8249 Family history of ischemic heart disease and other diseases of the circulatory system: Secondary | ICD-10-CM

## 2021-07-30 DIAGNOSIS — Z79899 Other long term (current) drug therapy: Secondary | ICD-10-CM

## 2021-07-30 DIAGNOSIS — R471 Dysarthria and anarthria: Secondary | ICD-10-CM | POA: Diagnosis present

## 2021-07-30 DIAGNOSIS — E1169 Type 2 diabetes mellitus with other specified complication: Secondary | ICD-10-CM | POA: Diagnosis present

## 2021-07-30 DIAGNOSIS — E669 Obesity, unspecified: Secondary | ICD-10-CM | POA: Insufficient documentation

## 2021-07-30 DIAGNOSIS — Z888 Allergy status to other drugs, medicaments and biological substances status: Secondary | ICD-10-CM

## 2021-07-30 DIAGNOSIS — Z7985 Long-term (current) use of injectable non-insulin antidiabetic drugs: Secondary | ICD-10-CM

## 2021-07-30 DIAGNOSIS — Z96643 Presence of artificial hip joint, bilateral: Secondary | ICD-10-CM | POA: Diagnosis present

## 2021-07-30 DIAGNOSIS — R69 Illness, unspecified: Secondary | ICD-10-CM | POA: Diagnosis not present

## 2021-07-30 DIAGNOSIS — Z85828 Personal history of other malignant neoplasm of skin: Secondary | ICD-10-CM

## 2021-07-30 DIAGNOSIS — I10 Essential (primary) hypertension: Secondary | ICD-10-CM | POA: Insufficient documentation

## 2021-07-30 LAB — RESP PANEL BY RT-PCR (FLU A&B, COVID) ARPGX2
Influenza A by PCR: NEGATIVE
Influenza B by PCR: NEGATIVE
SARS Coronavirus 2 by RT PCR: POSITIVE — AB

## 2021-07-30 LAB — ETHANOL: Alcohol, Ethyl (B): 39 mg/dL — ABNORMAL HIGH (ref <10)

## 2021-07-30 LAB — CBC WITH DIFFERENTIAL/PLATELET
Abs Immature Granulocytes: 0.2 10*3/uL — ABNORMAL HIGH (ref 0.00–0.07)
Basophils Absolute: 0 10*3/uL (ref 0.0–0.1)
Basophils Relative: 1 %
Eosinophils Absolute: 0.1 10*3/uL (ref 0.0–0.5)
Eosinophils Relative: 2 %
HCT: 37.3 % — ABNORMAL LOW (ref 39.0–52.0)
Hemoglobin: 13 g/dL (ref 13.0–17.0)
Immature Granulocytes: 3 %
Lymphocytes Relative: 22 %
Lymphs Abs: 1.5 10*3/uL (ref 0.7–4.0)
MCH: 32.3 pg (ref 26.0–34.0)
MCHC: 34.9 g/dL (ref 30.0–36.0)
MCV: 92.6 fL (ref 80.0–100.0)
Monocytes Absolute: 1 10*3/uL (ref 0.1–1.0)
Monocytes Relative: 16 %
Neutro Abs: 3.7 10*3/uL (ref 1.7–7.7)
Neutrophils Relative %: 56 %
Platelets: 218 10*3/uL (ref 150–400)
RBC: 4.03 MIL/uL — ABNORMAL LOW (ref 4.22–5.81)
RDW: 15.4 % (ref 11.5–15.5)
WBC: 6.5 10*3/uL (ref 4.0–10.5)
nRBC: 0 % (ref 0.0–0.2)

## 2021-07-30 LAB — COMPREHENSIVE METABOLIC PANEL
ALT: 22 U/L (ref 0–44)
ALT: 16 U/L (ref 0–44)
AST: 39 U/L (ref 15–41)
AST: 28 U/L (ref 15–41)
Albumin: 3.4 g/dL — ABNORMAL LOW (ref 3.5–5.0)
Albumin: 3.9 g/dL (ref 3.5–5.0)
Alkaline Phosphatase: 49 U/L (ref 38–126)
Alkaline Phosphatase: 48 U/L (ref 38–126)
Anion gap: 13 (ref 5–15)
Anion gap: 14 (ref 5–15)
BUN: 7 mg/dL — ABNORMAL LOW (ref 8–23)
BUN: 8 mg/dL (ref 8–23)
CO2: 29 mmol/L (ref 22–32)
CO2: 30 mmol/L (ref 22–32)
Calcium: 9.1 mg/dL (ref 8.9–10.3)
Calcium: 9.1 mg/dL (ref 8.9–10.3)
Chloride: 97 mmol/L — ABNORMAL LOW (ref 98–111)
Chloride: 95 mmol/L — ABNORMAL LOW (ref 98–111)
Creatinine, Ser: 1.06 mg/dL (ref 0.61–1.24)
Creatinine, Ser: 0.99 mg/dL (ref 0.61–1.24)
GFR, Estimated: >60 mL/min (ref >60)
GFR, Estimated: >60 mL/min (ref >60)
Glucose, Bld: 123 mg/dL — ABNORMAL HIGH (ref 70–99)
Glucose, Bld: 171 mg/dL — ABNORMAL HIGH (ref 70–99)
Potassium: 2.4 mmol/L — CL (ref 3.5–5.1)
Potassium: 2.4 mmol/L — CL (ref 3.5–5.1)
Sodium: 139 mmol/L (ref 135–145)
Sodium: 139 mmol/L (ref 135–145)
Total Bilirubin: 1.1 mg/dL (ref 0.3–1.2)
Total Bilirubin: 0.9 mg/dL (ref 0.3–1.2)
Total Protein: 6.9 g/dL (ref 6.5–8.1)
Total Protein: 6.9 g/dL (ref 6.5–8.1)

## 2021-07-30 LAB — RAPID URINE DRUG SCREEN, HOSP PERFORMED
Amphetamines: NOT DETECTED
Barbiturates: NOT DETECTED
Benzodiazepines: NOT DETECTED
Cocaine: NOT DETECTED
Opiates: NOT DETECTED
Tetrahydrocannabinol: NOT DETECTED

## 2021-07-30 LAB — POTASSIUM: Potassium: 2.6 mmol/L — CL (ref 3.5–5.1)

## 2021-07-30 LAB — TSH: TSH: 1.984 u[IU]/mL (ref 0.350–4.500)

## 2021-07-30 LAB — MAGNESIUM: Magnesium: 2 mg/dL (ref 1.7–2.4)

## 2021-07-30 MED ORDER — POTASSIUM CHLORIDE CRYS ER 20 MEQ PO TBCR
40.0000 meq | EXTENDED_RELEASE_TABLET | Freq: Once | ORAL | Status: AC
  Administered 2021-07-30: 40 meq via ORAL
  Filled 2021-07-30: qty 2

## 2021-07-30 MED ORDER — POTASSIUM CHLORIDE 10 MEQ/100ML IV SOLN
10.0000 meq | INTRAVENOUS | Status: AC
  Administered 2021-07-30 – 2021-07-31 (×3): 10 meq via INTRAVENOUS
  Filled 2021-07-30 (×2): qty 100

## 2021-07-30 MED ORDER — POTASSIUM CHLORIDE 10 MEQ/100ML IV SOLN
10.0000 meq | INTRAVENOUS | Status: AC
  Administered 2021-07-30 (×3): 10 meq via INTRAVENOUS
  Filled 2021-07-30 (×2): qty 100

## 2021-07-30 MED ORDER — POTASSIUM CHLORIDE 20 MEQ PO PACK: 60.0000 meq | PACK | Freq: Every day | ORAL | Status: DC

## 2021-07-30 NOTE — Progress Notes (Addendum)
Primary Care Physician: [name], MD Referring Physician: Dr. Rayann Heman Primary Cardiologist: Dr [place]  Adam [name] is a 70 y.o. male with a h/o  CABG in 2011, done in [place], HTN, HLD, DM, Atrial fibrillation,alcohol use, and OSA. He underwent catheter ablation for atrial fibrillation by Dr. Rayann Heman 03/31/18. He was followed by Dr. Radford Pax in sleep clinic, but unable to tolerate CPAP and turned in his equipment. He was previously on Xarelto for a CHADS2VASC score of 3. Patient was admitted 10/24/19 for GI bleeding and alcohol withdrawal. EP was consulted to assist with dofetilide reloading. His Xarelto was held on admission and was not resumed at discharge, he was eventually placed back on eliquis.  He was discharged to rehab for a prolonged period of time for Wernicke's encephalopathy.   He is now back in the afib clinic for pt feeling rapid heart rate.. By his device, he had had 3 episodes of afib.Fastest rate was 146 bpm, with longest duration of 44 mins. Recent burden was actually improved at 9.4%, previously at 14%. Presenting rhythm for the interpretation was in rapid a  Flutter and he continues in this rhythm today. He continues o drink alcohol, when asked the amount, he states "enough". He continues to have untreated OSA.   F/u in afib clinic, 03/31/21. He had a hospitalization for afib with [facility], 6-17 thru 6-21, worsened with not taking cardizem for 3 days as the drug store was out of it per pt. He was having ongoing alcohol abuse. Last alcohol was that am, wine with breakfast. He had some slurred speech, left facial droop that had occurred days earlier, PCP obtained MRI, negative for stroke.EP was consulted doe [facility], cardizem drip started and the decision was made to stop Tikosyn and start amio load. Pt was on amio in  the remote past, had issues with skin discoloration but EP did not see any alternative drug options. Hospitalization was complicated by alcohol withdrawal.   In the office  today, afib is rate controlled. He continues on amiodarone 400 mg bid, x 2 weeks. He states he has missed a few doses of DOAC, reminded not to do this with pending cardioversion. He feels improved.   F/u in afib clinic 04/21/21 after an ER visit for palpitations and found to be in afib. He had missed several dose of amiodarone and eliquis the week before. Planned for reloading of amiodarone and sent home. He is in SR today. He has been on 200 mg bid for the last 2 weeks. He will lower to 200 mg daily now. His is describing some constipation with amiodarone.   F/u in afib clinic, 07/30/21 for surveillance of amiodarone. He remains in SR but with a prolonged qtc at 592 ms. He denies any new drugs or benadryl use. His wife states that he intake of alcohol has worsened. He has fallen numerous times and his speech is slurred today. This has been getting worse over several  weeks time. He saw his PCP last week, had same symptoms and was  referred to neurology, appointment next week. He states he drinks 3-4 beverage glass size mixed  scotch drinks a day. He has not noted any afib.   Today, he denies symptoms of palpitations, chest pain, mild increase of shortness of breath and fatigue,  orthopnea, PND, lower extremity edema, dizziness, presyncope, syncope, or neurologic sequela. The patient is tolerating medications without difficulties and is otherwise without complaint today.   Past Medical History:  Diagnosis Date  Alcohol withdrawal ([place]) 10/24/2019   Allergy    Anxiety    history of PTSD following CABG   Ascending aortic aneurysm ([place]) 01/31/2018   43 x 42 mm, pt unaware   Asthma    Cardiomegaly 10/17/2017   Colitis- colonoscopy 2014 07/13/2015   COPD (chronic obstructive pulmonary disease) ([place])    Coronary artery disease    x 6   Depression    Diabetes mellitus without complication ([place])    Family history of polyps in the colon    Finger dislocation    Left pinkie   GERD (gastroesophageal  reflux disease)    Gout    H/O atrial fibrillation without current medication    following CABG with no documented episodes since then.   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   Past Surgical History:  Procedure Laterality Date   ANKLE FRACTURE SURGERY Right 1991   APPENDECTOMY     ATRIAL FIBRILLATION ABLATION N/A 03/31/2018   Procedure: ATRIAL FIBRILLATION ABLATION;  Surgeon: [name], MD;  Location: [facility];  Service: Cardiovascular;  Laterality: N/A;   CARDIAC ELECTROPHYSIOLOGY MAPPING AND ABLATION     CARDIOVERSION N/A 03/23/2021   Procedure: CARDIOVERSION;  Surgeon: Acie Fredrickson Wonda Cheng, MD;  Location: [facility];  Service: Cardiovascular;  Laterality: N/A;   COLONOSCOPY  06/11/2021   COLONOSCOPY W/ BIOPSIES  2017   x7   CORONARY ANGIOPLASTY WITH STENT PLACEMENT     CORONARY ARTERY BYPASS GRAFT     FINGER SURGERY  04/2018   Small finger left hand   implantable loop recorder placement  07/02/2019   Medtronic Reveal [place] model LNQ11 (SN [id number]) implanted in office by Dr Rayann Heman   TEE WITHOUT CARDIOVERSION N/A 03/30/2018   Procedure: TRANSESOPHAGEAL ECHOCARDIOGRAM (TEE);  Surgeon: [name], MD;  Location: [facility];  Service: Cardiovascular;  Laterality: N/A;   TEE WITHOUT CARDIOVERSION N/A 03/23/2021   Procedure: TRANSESOPHAGEAL ECHOCARDIOGRAM (TEE);  Surgeon: Acie Fredrickson Wonda Cheng, MD;  [facility];  Service: Cardiovascular;  Laterality: N/A;   TOTAL HIP ARTHROPLASTY Left    TOTAL HIP ARTHROPLASTY Right 09/12/2018   Procedure: TOTAL HIP ARTHROPLASTY ANTERIOR APPROACH;  Surgeon: [name], MD;  Location: [facility];  Service: Orthopedics;  Laterality:  Right;  70 mins    Current Outpatient Medications  Medication Sig Dispense Refill   acetaminophen (TYLENOL) 325 MG tablet Take 325 mg by mouth every 8 (eight) hours as needed for moderate pain.     allopurinol (ZYLOPRIM) 300 MG tablet TAKE 1 TABLET(300 MG) BY MOUTH DAILY 90 tablet 3   amiodarone (PACERONE) 200 MG tablet Take 1 tablet (200 mg total) by mouth daily. 90 tablet 1   B COMPLEX-C-FOLIC ACID PO Take 1 tablet by mouth daily.     Blood Glucose Monitoring Suppl (ONETOUCH VERIO) w/Device KIT Use 1-4 times daily as needed/directed DX E11.9 1 kit 0   divalproex (DEPAKOTE SPRINKLE) 125 MG capsule Take 125 mg by mouth 3 (three) times daily.     DULoxetine (CYMBALTA) 60 MG capsule TAKE 1 CAPSULE(60 MG) BY MOUTH DAILY 90 capsule 1   ELIQUIS 5 MG TABS tablet TAKE 1 TABLET(5 MG)  BY MOUTH TWICE DAILY 180 tablet 1   Fluticasone-Umeclidin-Vilant (TRELEGY ELLIPTA) 100-62.5-25 MCG/INH AEPB Inhale 1 puff into the lungs daily. 60 each 3   gabapentin (NEURONTIN) 300 MG capsule TAKE 2 CAPSULES BY MOUTH AT NIGHT AS NEEDED FOR RESTLESS LEGS OR SYMPTOMS 180 capsule 3   HM LIDOCAINE PATCH EX Apply 1 patch topically as needed.     Insulin Pen Needle (NOVOFINE PEN NEEDLE) 32G X 6 MM MISC Use 1-4 times daily as needed for insulin 100 each 3   JANUVIA 100 MG tablet TAKE 1 TABLET(100 MG) BY MOUTH DAILY 90 tablet 1   JARDIANCE 25 MG TABS tablet TAKE 1 TABLET(25 MG) BY MOUTH DAILY BEFORE AND BREAKFAST 90 tablet 1   Lancets (ONETOUCH ULTRASOFT) lancets Use 1-4 times daily as needed/directed  DX E11.9 200 each 12   metFORMIN (GLUCOPHAGE-XR) 500 MG 24 hr tablet Take 1 tablet (500 mg total) by mouth in the morning and at bedtime. 180 tablet 3   metoprolol tartrate (LOPRESSOR) 50 MG tablet Take 1 tablet (50 mg total) by mouth 2 (two) times daily.     montelukast (SINGULAIR) 10 MG tablet TAKE 1 TABLET BY MOUTH AT BEDTIME 90 tablet 3   Multiple Vitamin (MULTIVITAMIN WITH MINERALS) TABS tablet Take 1 tablet by mouth daily.      ONETOUCH VERIO test strip Use 1-4 times daily as directed/needed   DX E11.9 200 each 12   rosuvastatin (CRESTOR) 40 MG tablet Take 40 mg by mouth daily.     thiamine (VITAMIN B-1) 100 MG tablet Take 500 mg by mouth daily.     TOUJEO SOLOSTAR 300 UNIT/ML Solostar Pen Inject 80 Units into the skin every morning.     Magnesium Oxide 400 MG CAPS Take 1 capsule (400 mg total) by mouth daily. (Patient not taking: Reported on 07/30/2021) 90 capsule 1   No current facility-administered medications for this encounter.    Allergies  Allergen Reactions   Xarelto [Rivaroxaban] Other (See Comments)    Patient stated he "ended up in the hospital with a rectal bleed"   Amoxicillin Rash and Other (See Comments)    RASH IN GROIN AREA Has patient had a PCN reaction causing immediate rash, facial/tongue/throat swelling, SOB or lightheadedness with hypotension: no Has patient had a PCN reaction causing severe rash involving mucus membranes or skin necrosis: no Has patient had a PCN reaction that required hospitalization: no Has patient had a PCN reaction occurring within the last 10 years: yes If all of the above answers are "NO", then may proceed with Cephalosporin use.    Augmentin [Amoxicillin-Pot Clavulanate] Rash and Other (See Comments)    RASH IN GROIN AREA   Azithromycin Rash and Other (See Comments)    RASH IN GROIN AREA   Clindamycin/Lincomycin Rash   Keflex [Cephalexin] Rash    Social History   Socioeconomic History   Marital status: Married    Spouse name: Not on file   Number of children: 0   Years of education: Not on file   Highest education level: Not on file  Occupational History   Occupation: retired  Tobacco Use   Smoking status: Former    Packs/day: 1.00    Years: 7.00    Pack years: 7.00    Types: Cigarettes    Quit date: 10/04/1977    Years since quitting: 43.8   Smokeless tobacco: Never   Tobacco comments:    never smoked over 1 pack   Vaping Use   Vaping Use:  Never used  Substance and Sexual Activity   Alcohol use: Yes    Alcohol/week: 3.0 - 4.0 standard drinks    Types: 1 Glasses of wine, 2 - 3 Shots of liquor per week    Comment: DAILY   Drug use: No    Comment: Smoked Marijuana back in 1970s   Sexual activity: Yes  Other Topics Concern   Not on file  Social History Narrative   Married, no children   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 - returned to [employer] from [place] 2014   Enjoys bird watching, reading historical books about WWII, watching tv   Social Determinants of Radio broadcast assistant Strain: Low Risk    Difficulty of Paying Living Expenses: Not hard at [employer] Insecurity: Not on file  Transportation Needs: No Transportation Needs   Lack of Transportation (Medical): No   Lack of Transportation (Non-Medical): No  Physical Activity: Inactive   Days of Exercise per Week: 0 days   Minutes of Exercise per Session: 0 min  Stress: No Stress Concern Present   Feeling of Stress : Not at all  Social Connections: Moderately Isolated   Frequency of Communication with Friends and Family: More than three times a week   Frequency of Social Gatherings with Friends and Family: Never   Attends Religious Services: Never   Marine scientist or Organizations: No   Attends Archivist Meetings: Never   Marital Status: Married  Human resources officer Violence: Not At Risk   Fear of Current or Ex-Partner: No   Emotionally Abused: No   Physically Abused: No   Sexually Abused: No    Family History  Problem Relation Age of Onset   Colon cancer Mother    Cancer Father        UNKNOWN TYPE   Heart disease Paternal Grandmother    Heart disease Paternal Grandfather    Esophageal cancer Neg Hx    Rectal cancer Neg Hx    Stomach cancer Neg Hx     ROS- All systems are reviewed and negative except as per the HPI above  Physical Exam: Vitals:   07/30/21 1344  Weight: 106.2 kg  Height: 5'  10" (1.778 m)   Wt Readings from Last 3 Encounters:  07/30/21 106.2 kg  06/24/21 108.8 kg  06/11/21 106.1 kg    Labs: Lab Results  Component Value Date   NA 140 04/05/2021   K 3.2 (L) 04/05/2021   CL 103 04/05/2021   CO2 20 (L) 04/05/2021   GLUCOSE 138 (H) 04/05/2021   BUN 7 (L) 04/05/2021   CREATININE 0.69 04/05/2021   CALCIUM 8.6 (L) 04/05/2021   PHOS 3.2 03/20/2021   MG 2.5 (H) 03/24/2021   Lab Results  Component Value Date   INR 1.2 03/23/2021   Lab Results  Component Value Date   CHOL 137 09/23/2020   HDL 40 09/23/2020   LDLCALC 64 09/23/2020   TRIG 280 (H) 09/23/2020    GEN- The patient is well appearing obese male, alert and oriented x 3 today.   HEENT-head normocephalic, atraumatic, sclera clear, conjunctiva pink, hearing intact, trachea midline. Lungs- Clear to ausculation bilaterally, normal work of breathing Heart- regular rate and rhythm, no murmurs, rubs or gallops  GI- soft, NT, ND, + BS Extremities- no clubbing, cyanosis, or edema MS- no significant deformity or atrophy Skin- no rash or lesion Psych- euthymic mood, full affect Neuro- strength  and sensation are intact   EKG- NSR at 74 bpm, pr int 198 ms, qrs int 100 ms, qttc 592 ms   Epic records reviewed   Assessment and Plan: 1. Persistent afib S/p ablation 03/2018. Tikosyn stopped and amiodarone was  started 03/31/21 Long qtc today, awaiting results of labs , if no electrolyte abnormalities, will discuss with Dr. Rayann Heman how to proceed  For now continue amiodarone 200 mg qd Continue metoprolol tartrate 50 mg bid   Cmet/tsh today   This patients CHA2DS2-VASc Score and unadjusted Ischemic Stroke Rate (% per year) is equal to 4.8 % stroke rate/year from a score of 4  Reminded to not miss any eliquis 5 mg bid going forward   Above score calculated as 1 point each if present [CHF, HTN, DM, Vascular=MI/PAD/Aortic Plaque, Age if 65-74, or Male] Above score calculated as 2 points each if  present [Age > 75, or Stroke/TIA/TE]   2. OSA  Does not use cpap   3. CAD  No recent issues with chest pain  4. HTN Stable   5. Obesity Body mass index is 33.6 kg/m. Lifestyle modification was discussed and encouraged including regular physical activity and weight reduction. Avoidance of alcohol would be ideal   6.  Slurred speech/increase in falls Present with PCP visit last week, has been progressively getting worse PCP arranged  neurology appointment next week  Asked to use a cane or walker with walking     I will see back in 6 months  Will need amio labs at that time   Addendum- 15:30 10/27- K+ resulted out critically low, at 2.4, probably why qt interval is prolonged today.. I discussed with Dr. Rayann Heman and he suggested to go to the  ER for IV repletion under cardiac  monitoring. I discussed with the wife and she plans  to take him to [facility] ER.Marland Kitchen He will need f/u EKG to recheck qtc when repleted.    Geroge Baseman Angelisa Winthrop, [facility] [address] [place], [place] [address] [phone]

## 2021-07-30 NOTE — ED Provider Notes (Addendum)
[facility] EMERGENCY DEPT Provider Note   CSN: [id number] Arrival date & time: 07/30/21  1553     History No chief complaint on file.   Adam [name] is a 71 y.o. male.  HPI   Pt complains of low potassium.  [facility], had labs checked which showed a potassium of 2.4.  Patient reports at 1 point he did take potassium as part of his medications but was stopped sometime ago.  He reports he is not sure why it was discontinued.  He denies muscle cramping.  No dizziness, lightheadedness, weakness.  He does has frequent falls, this is chronic for him.  He is on Eliquis.  He has not hit his head in the past week.  Last fall was over a week ago.  Patient reports that he was diagnosed with Warnicke's encephalopathy a couple years ago.  He reports he did a prolonged period of rehab at [facility].  His symptoms however persist with imbalance and now slurred speech as well.  Patient's wife reports they are scheduled to have evaluation with neurology in a week, the patient reports based on his research he thinks he may have Lewy body dementia.    Past Medical History:  Diagnosis Date  . Alcohol withdrawal ([place]) 10/24/2019  . Allergy   . Anxiety    history of PTSD following CABG  . Ascending aortic aneurysm 01/31/2018   43 x 42 mm, pt unaware  . Asthma   . Cardiomegaly 10/17/2017  . Colitis- colonoscopy 2014 21-Jul-2015  . COPD (chronic obstructive pulmonary disease) ([place])   . Coronary artery disease    x 6  . Depression   . Diabetes mellitus without complication ([place])   . Family history of polyps in the colon   . Finger dislocation    Left pinkie  . GERD (gastroesophageal reflux disease)   . Gout   . H/O atrial fibrillation without current medication    following CABG with no documented episodes since then.  . Heart palpitations   . Hx of adenomatous colonic polyps 08/12/2010  . Hyperlipidemia   . Hypertension   . OA (osteoarthritis)   . OSA  (obstructive sleep apnea)    Mild, has not received CPAP yet  . Prediabetes   . Protein-calorie malnutrition, severe 03/01/2020  . RLS (restless legs syndrome)   . 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 Hypertension 06/05/2020  . Pressure injury of skin 02/21/2020  . Rapid atrial fibrillation ([place]) 02/20/2020  . High anion gap metabolic acidosis 76/73/4193  . Secondary hypercoagulable state ([place]) 11/02/2019  . Paroxysmal atrial fibrillation ([place]) 10/24/2019  . Degeneration of lumbar intervertebral disc 09/27/2019  . Thoracic aortic aneurysm 02/08/2019  . Hemoptysis 10/06/2018  . S/P right THA, 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  . S/P hip replacement 09/12/2018  . Low back pain 05/19/2018  . Sleep apnea 01/09/2018  . Persistent atrial fibrillation 12/12/2017  . Allergic rhinitis 08/22/2015  . Family history of colon cancer in mother deceased age 81 07-21-2015  . Type 2 diabetes mellitus, controlled ([place]) 07/17/2013  . COPD (chronic obstructive pulmonary disease) ([place]) 07/05/2013  . CAD (coronary artery disease) 06/26/2013  . Hypertension associated with diabetes ([place]) 06/26/2013  . Hyperlipidemia associated with type 2 diabetes mellitus ([place]) 06/26/2013  . GERD (gastroesophageal reflux disease) 06/26/2013  . History of atrial fibrillation without current medication 06/26/2013  . Gout 06/26/2013  .  Osteoarthritis 06/26/2013  . Asthma, mild persistent 06/26/2013  . Alcohol abuse 06/26/2013  . PTSD (post-traumatic stress disorder) 06/26/2013  . HLA B27 (HLA B27 positive) 06/26/2013  . Obesity (BMI 30-39.9) 06/26/2013  . Benign essential hypertension 06/26/2013    Past Surgical History:  Procedure Laterality Date  . ANKLE FRACTURE SURGERY Right 1991  . APPENDECTOMY    . ATRIAL FIBRILLATION ABLATION N/A 03/31/2018   Procedure: ATRIAL FIBRILLATION ABLATION;  Surgeon: [name], MD;  Location: [facility];  Service:  Cardiovascular;  Laterality: N/A;  . CARDIAC ELECTROPHYSIOLOGY MAPPING AND ABLATION    . CARDIOVERSION N/A 03/23/2021   Procedure: CARDIOVERSION;  Surgeon: Acie Fredrickson Wonda Cheng, MD;  Location: [facility];  Service: Cardiovascular;  Laterality: N/A;  . COLONOSCOPY  06/11/2021  . COLONOSCOPY W/ BIOPSIES  2017   x7  . CORONARY ANGIOPLASTY WITH STENT PLACEMENT    . CORONARY ARTERY BYPASS GRAFT    . FINGER SURGERY  04/2018   Small finger left hand  . implantable loop recorder placement  07/02/2019   Medtronic Reveal [place] model [id number] ([place] [id number]) implanted in office by Dr Rayann Heman  . TEE WITHOUT CARDIOVERSION N/A 03/30/2018   Procedure: TRANSESOPHAGEAL ECHOCARDIOGRAM (TEE);  Surgeon: [name], MD;  Location: [facility];  Service: Cardiovascular;  Laterality: N/A;  . TEE WITHOUT CARDIOVERSION N/A 03/23/2021   Procedure: TRANSESOPHAGEAL ECHOCARDIOGRAM (TEE);  Surgeon: Acie Fredrickson Wonda Cheng, MD;  Location: [facility];  Service: Cardiovascular;  Laterality: N/A;  . TOTAL HIP ARTHROPLASTY Left   . TOTAL HIP ARTHROPLASTY Right 09/12/2018   Procedure: TOTAL HIP ARTHROPLASTY ANTERIOR APPROACH;  Surgeon: [name], MD;  Location: [facility];  Service: Orthopedics;  Laterality: Right;  70 mins       Family History  Problem Relation Age of Onset  . Colon cancer Mother   . Cancer Father        UNKNOWN TYPE  . Heart disease Paternal Grandmother   . Heart disease Paternal Grandfather   . Esophageal cancer Neg Hx   . Rectal cancer Neg Hx   . Stomach cancer Neg Hx     Social History   Tobacco Use  . Smoking status: Former    Packs/day: 1.00    Years: 7.00    Pack years: 7.00    Types: Cigarettes    Quit date: 10/04/1977    Years since quitting: 43.8  . Smokeless tobacco: Never  . Tobacco comments:    never smoked over 1 pack   Vaping Use  . Vaping Use: Never used  Substance Use Topics  . Alcohol use: Yes    Alcohol/week: 3.0 - 4.0 standard drinks    Types: 1 Glasses of wine, 2 - 3  Shots of liquor per week    Comment: DAILY  . Drug use: No    Comment: Smoked Marijuana back in 1970s    Home Medications Prior to Admission medications   Medication Sig Start Date End Date Taking? Authorizing Provider  acetaminophen (TYLENOL) 325 MG tablet Take 325 mg by mouth every 8 (eight) hours as needed for moderate pain.    [provider]  allopurinol (ZYLOPRIM) 300 MG tablet TAKE 1 TABLET(300 MG) BY MOUTH DAILY 12/05/20   [name], Alinda Sierras, MD  amiodarone (PACERONE) 200 MG tablet Take 1 tablet (200 mg total) by mouth daily. 04/21/21   [name], NP  B COMPLEX-C-FOLIC ACID PO Take 1 tablet by mouth daily.    [provider]  Blood Glucose Monitoring Suppl (ONETOUCH VERIO)  w/Device KIT Use 1-4 times daily as needed/directed DX E11.9 09/29/20   [name], Alinda Sierras, MD  divalproex (DEPAKOTE SPRINKLE) 125 MG capsule Take 125 mg by mouth 3 (three) times daily. 04/21/21   [provider]  DULoxetine (CYMBALTA) 60 MG capsule TAKE 1 CAPSULE(60 MG) BY MOUTH DAILY 03/19/21   [name], Alinda Sierras, MD  ELIQUIS 5 MG TABS tablet TAKE 1 TABLET(5 MG) BY MOUTH TWICE DAILY 03/04/21   [name], Alinda Sierras, MD  Fluticasone-Umeclidin-Vilant (TRELEGY ELLIPTA) 100-62.5-25 MCG/INH AEPB Inhale 1 puff into the lungs daily. 11/11/20   [name], MD  gabapentin (NEURONTIN) 300 MG capsule TAKE 2 CAPSULES BY MOUTH AT NIGHT AS NEEDED FOR RESTLESS LEGS OR SYMPTOMS 06/30/21   [name], Alinda Sierras, MD  HM LIDOCAINE PATCH EX Apply 1 patch topically as needed.    [provider]  Insulin Pen Needle (NOVOFINE PEN NEEDLE) 32G X 6 MM MISC Use 1-4 times daily as needed for insulin 09/15/20   [name], Alinda Sierras, MD  JANUVIA 100 MG tablet TAKE 1 TABLET(100 MG) BY MOUTH DAILY 06/02/21   [name], Alinda Sierras, MD  JARDIANCE 25 MG TABS tablet TAKE 1 TABLET(25 MG) BY MOUTH DAILY BEFORE AND BREAKFAST 03/04/21   [name], Alinda Sierras, MD  Lancets [facility] ULTRASOFT) lancets Use 1-4 times daily as  needed/directed  DX E11.9 09/29/20   [name], Alinda Sierras, MD  Magnesium Oxide 400 MG CAPS Take 1 capsule (400 mg total) by mouth daily. Patient not taking: Reported on 07/30/2021 09/15/20   [name], MD  metFORMIN (GLUCOPHAGE-XR) 500 MG 24 hr tablet Take 1 tablet (500 mg total) by mouth in the morning and at bedtime. 09/23/20   [name], Alinda Sierras, MD  metoprolol tartrate (LOPRESSOR) 50 MG tablet Take 1 tablet (50 mg total) by mouth 2 (two) times daily. 04/21/21 07/30/21  [name], NP  montelukast (SINGULAIR) 10 MG tablet TAKE 1 TABLET BY MOUTH AT BEDTIME 12/05/20   [name], Alinda Sierras, MD  Multiple Vitamin (MULTIVITAMIN WITH MINERALS) TABS tablet Take 1 tablet by mouth daily. 03/04/20   Mercy Riding, MD  ONETOUCH VERIO test strip Use 1-4 times daily as directed/needed   DX E11.9 09/29/20   [name], Alinda Sierras, MD  rosuvastatin (CRESTOR) 40 MG tablet Take 40 mg by mouth daily.    [provider]  thiamine (VITAMIN B-1) 100 MG tablet Take 500 mg by mouth daily.    [provider]  TOUJEO SOLOSTAR 300 UNIT/ML Solostar Pen Inject 80 Units into the skin every morning. 06/29/21   [provider]    Allergies    Xarelto [rivaroxaban], Amoxicillin, Augmentin [amoxicillin-pot clavulanate], Azithromycin, Clindamycin/lincomycin, and Keflex [cephalexin]  Review of Systems   Review of Systems 10 systems reviewed and negative except as per HPI Physical Exam Updated Vital Signs BP 128/65   Pulse 80   Temp 98.1 F (36.7 C)   Resp 19   Ht _0  (1.778 m)   Wt 106.2 kg   SpO2 99%   BMI 33.60 kg/m   Physical Exam Constitutional:      Comments: Alert nontoxic.  No respiratory distress at rest.  Patient is not confused.  His speech is slightly slurred but all the content is situationally appropriate.  HENT:     Head: Normocephalic and atraumatic.     Mouth/Throat:     Pharynx: Oropharynx is clear.  Eyes:     Extraocular Movements: Extraocular movements intact.   Cardiovascular:     Rate and Rhythm:  Normal rate and regular rhythm.  Pulmonary:     Effort: Pulmonary effort is normal.     Breath sounds: Normal breath sounds.  Abdominal:     General: There is no distension.     Palpations: Abdomen is soft.     Tenderness: There is no abdominal tenderness. There is no guarding.  Musculoskeletal:     Comments: Patient does not have significant peripheral edema.  He does have a lot of skin thickening with psoriasis and skin changes.  Neurological:     Comments: Patient speech has a slightly slurred quality but the content is normal and shows normal cognitive function.  Patient seems generally deconditioned physically.  He however is not exhibiting any focal motor deficits    ED Results / Procedures / Treatments   Labs (all labs ordered are listed, but only abnormal results are displayed) Labs Reviewed  CBC WITH DIFFERENTIAL/PLATELET - Abnormal; Notable for the following components:      Result Value   RBC 4.03 (*)    HCT 37.3 (*)    Abs Immature Granulocytes 0.20 (*)    All other components within normal limits  COMPREHENSIVE METABOLIC PANEL - Abnormal; Notable for the following components:   Potassium 2.4 (*)    Chloride 95 (*)    Glucose, Bld 171 (*)    All other components within normal limits  ETHANOL - Abnormal; Notable for the following components:   Alcohol, Ethyl (B) 39 (*)    All other components within normal limits  POTASSIUM - Abnormal; Notable for the following components:   Potassium 2.6 (*)    All other components within normal limits  MAGNESIUM  RAPID URINE DRUG SCREEN, HOSP PERFORMED    EKG EKG Interpretation  Date/Time:  Thursday July 30 2021 16:35:29 EDT Ventricular Rate:  80 PR Interval:    QRS Duration: 84 QT Interval:  218 QTC Calculation: 251 R Axis:   28 Text Interpretation: Accelerated Junctional rhythm Low voltage QRS Cannot rule out Anterior infarct , age undetermined ST & T wave abnormality, consider  inferolateral ischemia Abnormal ECG artifact , possible flutter regular Confirmed by Charlesetta Shanks [id number]) on 07/30/2021 6:14:32 PM  Radiology No results found.  Procedures Procedures  CRITICAL CARE Performed by: Charlesetta Shanks   Total critical care time: 30 minutes  Critical care time was exclusive of separately billable procedures and treating other patients.  Critical care was necessary to treat or prevent imminent or life-threatening deterioration.  Critical care was time spent personally by me on the following activities: development of treatment plan with patient and/or surrogate as well as nursing, discussions with consultants, evaluation of patient's response to treatment, examination of patient, obtaining history from patient or surrogate, ordering and performing treatments and interventions, ordering and review of laboratory studies, ordering and review of radiographic studies, pulse oximetry and re-evaluation of patient's condition.  Medications Ordered in ED Medications  potassium chloride (KLOR-CON) packet 60 mEq (has no administration in time range)  potassium chloride 10 mEq in 100 mL IVPB (has no administration in time range)  potassium chloride SA (KLOR-CON) CR tablet 40 mEq (40 mEq Oral Given 07/30/21 1751)  potassium chloride 10 mEq in 100 mL IVPB (0 mEq Intravenous Stopped 07/30/21 2114)    ED Course  I have reviewed the triage vital signs and the nursing notes.  Pertinent labs & imaging results that were available during my care of the patient were reviewed by me and considered in my medical decision making (see chart for  details).  Clinical Course as of 07/30/21 2241  Thu Jul 30, 2021  2240 Consult: Reviewed with Dr. Alcario Drought for admission. [MP]    Clinical Course User Index [MP] Charlesetta Shanks, MD   MDM Rules/Calculators/A&P                          Patient has multiple chronic medical problems.  He was seen at [facility] today and found to be  significantly hypokalemic.  Patient does not endorse any symptoms.  He has been having chronic problems that have neurologic symptoms.  No acute changes.  This is been worked out for a long period of time and patient has follow-up with neurology.  Will start potassium replacement and continue to monitor.  Marginal improvement in potassium from 2.4 to 2.6 after 40 mEq orally and 30 mEq IV.  Patient has significant risk factors for dysrhythmia with hypokalemia.  Will admit for ongoing potassium replacement. Final Clinical Impression(s) / ED Diagnoses Final diagnoses:  Hypokalemia  Paroxysmal atrial fibrillation ([place])  Anticoagulated  Severe comorbid illness    Rx / DC Orders ED Discharge Orders     None        Charlesetta Shanks, MD 07/30/21 1818    Charlesetta Shanks, MD 07/30/21 2220

## 2021-07-30 NOTE — Addendum Note (Signed)
Encounter addended by: [name], NP on: 07/30/2021 4:49 PM  Actions taken: Clinical Note Signed, Result note filed

## 2021-07-30 NOTE — ED Triage Notes (Signed)
Sent from[facility] clinic  was told his potassium was low 2.6 , has been fallling a lot , wife states has not hit  head ,on eliquis

## 2021-07-30 NOTE — Plan of Care (Signed)
Pt with asymptomatic hypokalemia.  EDP wants admit.  Still drinks EtOH, mg normal.  TRH will assume care on arrival to accepting facility. Until arrival, care as per EDP. However, TRH available 24/7 for questions and assistance.  Nursing staff please page [place] and Consults [phone]) as soon as the patient arrives the hospital.

## 2021-07-30 NOTE — ED Provider Notes (Signed)
Emergency Medicine Provider Triage Evaluation Note  Adam [name] , a 70 y.o. male  was evaluated in triage.  Pt complains of low potassium.  Was at [facility] today, had labs checked which showed a potassium of 2.4.  He denies muscle cramping.  No dizziness, lightheadedness, weakness.  He does has frequent falls, this is chronic for him.  He is on Eliquis.  He has not hit his head in the past week.   Review of Systems  Positive: falls Negative: Cramping, weakness  Physical Exam  BP 119/69   Pulse 81   Temp 98.1 F (36.7 C)   Resp 16   Ht 5\' 10"  (1.778 m)   Wt 106.2 kg   SpO2 96%   BMI 33.60 kg/m  Gen:   Awake, no distress   Resp:  Normal effort  MSK:   Moves extremities without difficulty    Medical Decision Making  Medically screening exam initiated at 4:32 PM.  Appropriate orders placed.  Ashby Dawes Touchette was informed that the remainder of the evaluation will be completed by another provider, this initial triage assessment does not replace that evaluation, and the importance of remaining in the ED until their evaluation is complete.  Labs, ekg   [place], [place], [place] 07/30/21 1632    Charlesetta Shanks, MD 08/06/21 2348

## 2021-07-31 ENCOUNTER — Observation Stay (HOSPITAL_COMMUNITY): Payer: Medicare Other

## 2021-07-31 DIAGNOSIS — J309 Allergic rhinitis, unspecified: Secondary | ICD-10-CM | POA: Diagnosis present

## 2021-07-31 DIAGNOSIS — G2581 Restless legs syndrome: Secondary | ICD-10-CM | POA: Diagnosis present

## 2021-07-31 DIAGNOSIS — R296 Repeated falls: Secondary | ICD-10-CM | POA: Diagnosis present

## 2021-07-31 DIAGNOSIS — K219 Gastro-esophageal reflux disease without esophagitis: Secondary | ICD-10-CM | POA: Diagnosis present

## 2021-07-31 DIAGNOSIS — E876 Hypokalemia: Secondary | ICD-10-CM | POA: Diagnosis present

## 2021-07-31 DIAGNOSIS — D6859 Other primary thrombophilia: Secondary | ICD-10-CM | POA: Diagnosis present

## 2021-07-31 DIAGNOSIS — R0602 Shortness of breath: Secondary | ICD-10-CM | POA: Diagnosis not present

## 2021-07-31 DIAGNOSIS — R471 Dysarthria and anarthria: Secondary | ICD-10-CM | POA: Diagnosis present

## 2021-07-31 DIAGNOSIS — M1990 Unspecified osteoarthritis, unspecified site: Secondary | ICD-10-CM | POA: Diagnosis present

## 2021-07-31 DIAGNOSIS — G4733 Obstructive sleep apnea (adult) (pediatric): Secondary | ICD-10-CM | POA: Diagnosis present

## 2021-07-31 DIAGNOSIS — F1010 Alcohol abuse, uncomplicated: Secondary | ICD-10-CM | POA: Diagnosis present

## 2021-07-31 DIAGNOSIS — I2510 Atherosclerotic heart disease of native coronary artery without angina pectoris: Secondary | ICD-10-CM | POA: Diagnosis present

## 2021-07-31 DIAGNOSIS — Z6833 Body mass index (BMI) 33.0-33.9, adult: Secondary | ICD-10-CM | POA: Diagnosis not present

## 2021-07-31 DIAGNOSIS — E1169 Type 2 diabetes mellitus with other specified complication: Secondary | ICD-10-CM | POA: Diagnosis present

## 2021-07-31 DIAGNOSIS — E785 Hyperlipidemia, unspecified: Secondary | ICD-10-CM | POA: Diagnosis present

## 2021-07-31 DIAGNOSIS — F419 Anxiety disorder, unspecified: Secondary | ICD-10-CM | POA: Diagnosis present

## 2021-07-31 DIAGNOSIS — I712 Thoracic aortic aneurysm, without rupture, unspecified: Secondary | ICD-10-CM | POA: Diagnosis present

## 2021-07-31 DIAGNOSIS — E669 Obesity, unspecified: Secondary | ICD-10-CM | POA: Diagnosis present

## 2021-07-31 DIAGNOSIS — U071 COVID-19: Secondary | ICD-10-CM | POA: Diagnosis present

## 2021-07-31 DIAGNOSIS — I4819 Other persistent atrial fibrillation: Secondary | ICD-10-CM | POA: Diagnosis present

## 2021-07-31 DIAGNOSIS — M109 Gout, unspecified: Secondary | ICD-10-CM | POA: Diagnosis present

## 2021-07-31 DIAGNOSIS — F32A Depression, unspecified: Secondary | ICD-10-CM | POA: Diagnosis present

## 2021-07-31 DIAGNOSIS — I152 Hypertension secondary to endocrine disorders: Secondary | ICD-10-CM | POA: Diagnosis present

## 2021-07-31 DIAGNOSIS — J449 Chronic obstructive pulmonary disease, unspecified: Secondary | ICD-10-CM | POA: Diagnosis present

## 2021-07-31 DIAGNOSIS — E512 Wernicke's encephalopathy: Secondary | ICD-10-CM | POA: Diagnosis present

## 2021-07-31 LAB — BASIC METABOLIC PANEL
Anion gap: 8 (ref 5–15)
BUN: 7 mg/dL — ABNORMAL LOW (ref 8–23)
CO2: 28 mmol/L (ref 22–32)
Calcium: 8.5 mg/dL — ABNORMAL LOW (ref 8.9–10.3)
Chloride: 101 mmol/L (ref 98–111)
Creatinine, Ser: 0.96 mg/dL (ref 0.61–1.24)
GFR, Estimated: >60 mL/min (ref >60)
Glucose, Bld: 152 mg/dL — ABNORMAL HIGH (ref 70–99)
Potassium: 2.5 mmol/L — CL (ref 3.5–5.1)
Sodium: 137 mmol/L (ref 135–145)

## 2021-07-31 LAB — FERRITIN: Ferritin: 147 ng/mL (ref 24–336)

## 2021-07-31 LAB — D-DIMER, QUANTITATIVE: D-Dimer, Quant: 1.86 ug/mL-FEU — ABNORMAL HIGH (ref 0.00–0.50)

## 2021-07-31 LAB — GLUCOSE, CAPILLARY
Glucose-Capillary: 154 mg/dL — ABNORMAL HIGH (ref 70–99)
Glucose-Capillary: 141 mg/dL — ABNORMAL HIGH (ref 70–99)
Glucose-Capillary: 150 mg/dL — ABNORMAL HIGH (ref 70–99)
Glucose-Capillary: 142 mg/dL — ABNORMAL HIGH (ref 70–99)

## 2021-07-31 LAB — LACTATE DEHYDROGENASE: LDH: 180 U/L (ref 98–192)

## 2021-07-31 LAB — C-REACTIVE PROTEIN: CRP: 0.9 mg/dL (ref <1.0)

## 2021-07-31 LAB — FIBRINOGEN: Fibrinogen: 394 mg/dL (ref 210–475)

## 2021-07-31 LAB — POTASSIUM: Potassium: 3 mmol/L — ABNORMAL LOW (ref 3.5–5.1)

## 2021-07-31 MED ORDER — AMIODARONE HCL 200 MG PO TABS
200.0000 mg | ORAL_TABLET | Freq: Every day | ORAL | Status: DC
  Administered 2021-07-31 – 2021-08-02 (×3): 200 mg via ORAL
  Filled 2021-07-31 (×4): qty 1

## 2021-07-31 MED ORDER — POTASSIUM CHLORIDE 2 MEQ/ML IV SOLN
INTRAVENOUS | Status: AC
  Filled 2021-07-31: qty 1000

## 2021-07-31 MED ORDER — ADULT MULTIVITAMIN W/MINERALS CH
1.0000 | ORAL_TABLET | Freq: Every day | ORAL | Status: DC
  Administered 2021-07-31 – 2021-08-02 (×3): 1 via ORAL
  Filled 2021-07-31 (×3): qty 1

## 2021-07-31 MED ORDER — LORAZEPAM 2 MG/ML IJ SOLN: 1.0000 mg | INTRAMUSCULAR | Status: DC | PRN

## 2021-07-31 MED ORDER — MAGNESIUM OXIDE 400 MG PO CAPS: 1.0000 | ORAL_CAPSULE | Freq: Every day | ORAL | Status: DC

## 2021-07-31 MED ORDER — VITAMIN B-1 100 MG PO TABS: 500.0000 mg | ORAL_TABLET | Freq: Every day | ORAL | Status: DC

## 2021-07-31 MED ORDER — FOLIC ACID 1 MG PO TABS
1.0000 mg | ORAL_TABLET | Freq: Every day | ORAL | Status: DC
  Administered 2021-07-31 – 2021-08-02 (×3): 1 mg via ORAL
  Filled 2021-07-31 (×3): qty 1

## 2021-07-31 MED ORDER — ACETAMINOPHEN 650 MG RE SUPP: 650.0000 mg | Freq: Four times a day (QID) | RECTAL | Status: DC | PRN

## 2021-07-31 MED ORDER — ROSUVASTATIN CALCIUM 20 MG PO TABS
40.0000 mg | ORAL_TABLET | Freq: Every day | ORAL | Status: DC
  Administered 2021-07-31 – 2021-08-02 (×3): 40 mg via ORAL
  Filled 2021-07-31 (×3): qty 2

## 2021-07-31 MED ORDER — APIXABAN 5 MG PO TABS
5.0000 mg | ORAL_TABLET | Freq: Two times a day (BID) | ORAL | Status: DC
  Administered 2021-07-31 – 2021-08-02 (×5): 5 mg via ORAL
  Filled 2021-07-31 (×5): qty 1

## 2021-07-31 MED ORDER — ACETAMINOPHEN 325 MG PO TABS: 650.0000 mg | ORAL_TABLET | Freq: Four times a day (QID) | ORAL | Status: DC | PRN

## 2021-07-31 MED ORDER — THIAMINE HCL 100 MG/ML IJ SOLN
100.0000 mg | Freq: Every day | INTRAMUSCULAR | Status: DC
  Administered 2021-07-31 – 2021-08-02 (×3): 100 mg via INTRAVENOUS
  Filled 2021-07-31 (×3): qty 2

## 2021-07-31 MED ORDER — THIAMINE HCL 100 MG PO TABS: 100.0000 mg | ORAL_TABLET | Freq: Every day | ORAL | Status: DC

## 2021-07-31 MED ORDER — INSULIN ASPART 100 UNIT/ML IJ SOLN: 0.0000 [IU] | Freq: Every day | INTRAMUSCULAR | Status: DC

## 2021-07-31 MED ORDER — ADULT MULTIVITAMIN W/MINERALS CH: 1.0000 | ORAL_TABLET | Freq: Every day | ORAL | Status: DC

## 2021-07-31 MED ORDER — INSULIN GLARGINE-YFGN 100 UNIT/ML ~~LOC~~ SOLN
30.0000 [IU] | Freq: Every day | SUBCUTANEOUS | Status: DC
  Administered 2021-07-31 – 2021-08-02 (×3): 30 [IU] via SUBCUTANEOUS
  Filled 2021-07-31 (×3): qty 0.3

## 2021-07-31 MED ORDER — POTASSIUM CHLORIDE 20 MEQ PO PACK
40.0000 meq | PACK | Freq: Two times a day (BID) | ORAL | Status: AC
  Administered 2021-07-31 (×2): 40 meq via ORAL
  Filled 2021-07-31 (×2): qty 2

## 2021-07-31 MED ORDER — CHLORDIAZEPOXIDE HCL 5 MG PO CAPS
15.0000 mg | ORAL_CAPSULE | Freq: Three times a day (TID) | ORAL | Status: DC
  Administered 2021-07-31 – 2021-08-02 (×6): 15 mg via ORAL
  Filled 2021-07-31 (×6): qty 3

## 2021-07-31 MED ORDER — MAGNESIUM OXIDE -MG SUPPLEMENT 400 (240 MG) MG PO TABS
400.0000 mg | ORAL_TABLET | Freq: Every day | ORAL | Status: DC
  Administered 2021-07-31 – 2021-08-02 (×3): 400 mg via ORAL
  Filled 2021-07-31 (×3): qty 1

## 2021-07-31 MED ORDER — LORAZEPAM 1 MG PO TABS
1.0000 mg | ORAL_TABLET | ORAL | Status: DC | PRN
  Administered 2021-08-01: 1 mg via ORAL
  Filled 2021-07-31: qty 1

## 2021-07-31 MED ORDER — GABAPENTIN 300 MG PO CAPS
600.0000 mg | ORAL_CAPSULE | Freq: Every evening | ORAL | Status: DC | PRN
  Administered 2021-07-31: 600 mg via ORAL
  Filled 2021-07-31: qty 2

## 2021-07-31 MED ORDER — DIVALPROEX SODIUM 125 MG PO CSDR
125.0000 mg | DELAYED_RELEASE_CAPSULE | Freq: Three times a day (TID) | ORAL | Status: DC
  Administered 2021-07-31 – 2021-08-02 (×7): 125 mg via ORAL
  Filled 2021-07-31 (×7): qty 1

## 2021-07-31 MED ORDER — ALLOPURINOL 100 MG PO TABS
100.0000 mg | ORAL_TABLET | Freq: Every day | ORAL | Status: DC
  Administered 2021-07-31 – 2021-08-02 (×3): 100 mg via ORAL
  Filled 2021-07-31 (×3): qty 1

## 2021-07-31 MED ORDER — METOPROLOL TARTRATE 50 MG PO TABS
50.0000 mg | ORAL_TABLET | Freq: Two times a day (BID) | ORAL | Status: DC
  Administered 2021-07-31 – 2021-08-02 (×5): 50 mg via ORAL
  Filled 2021-07-31 (×5): qty 1

## 2021-07-31 MED ORDER — POTASSIUM CHLORIDE 20 MEQ PO PACK: 60.0000 meq | PACK | Freq: Every day | ORAL | Status: DC

## 2021-07-31 MED ORDER — INSULIN ASPART 100 UNIT/ML IJ SOLN
0.0000 [IU] | Freq: Three times a day (TID) | INTRAMUSCULAR | Status: DC
Start: 2021-07-31 — End: 2021-08-02
  Administered 2021-07-31: 3 [IU] via SUBCUTANEOUS
  Administered 2021-07-31: 2 [IU] via SUBCUTANEOUS
  Administered 2021-08-01: 11 [IU] via SUBCUTANEOUS
  Administered 2021-08-01: 3 [IU] via SUBCUTANEOUS
  Administered 2021-08-02: 2 [IU] via SUBCUTANEOUS

## 2021-07-31 MED ORDER — POTASSIUM CHLORIDE 20 MEQ PO PACK
40.0000 meq | PACK | ORAL | Status: DC
Start: 2021-07-31 — End: 2021-07-31

## 2021-07-31 NOTE — H&P (Signed)
History and Physical    Adam [name] MRN:[id number] DOB: 05/25/1951 DOA: 07/30/2021  PCP: [name], Bruce W, MD Patient coming from: [facility]  Chief Complaint: Low potassium  HPI: Adam [name] is a 70 y.o. male with medical history significant of CAD status post CABG and stenting, persistent A. fib on Eliquis, COPD, hypertension, hyperlipidemia, insulin-dependent type 2 diabetes, alcohol abuse, OSA does not use CPAP, obesity (BMI 33.60), mood disorder, GERD, gout.  He was sent to the ED yesterday from [facility]. fib clinic as labs revealed hypokalemia with potassium 2.6.  EKG done at [facility] clinic showing sinus rhythm and worsening QT prolongation (QTC 592).  Labs done in the ED revealed potassium 2.4.  Magnesium 2.0.  Blood ethanol level 39.  Patient was given oral potassium 40 mEq and IV potassium 30 mEq.  Repeat labs showing only minimal improvement with potassium 2.6.  Patient was given additional IV potassium 30 mEq.  Repeat BMP pending.  UDS negative.  SARS-CoV-2 PCR test positive.    Patient is endorsing fatigue.  No muscle cramps.  States he is supposed to be taking a potassium supplement at home but stopped taking it.  He drinks 3 to 4 glasses of scotch every day.  No vomiting or diarrhea.  States his wife recently had COVID and he thinks he might have it too.  He is vaccinated against COVID and has received 1 booster.  Denies fevers but does endorse cough and shortness of breath.  Denies chest pain.  States he was hospitalized several months ago for "Wernicke's encephalopathy" and since then he has had trouble with his speech.  He has an appointment with neurology this coming Wednesday.  Review of Systems:  All systems reviewed and apart from history of presenting illness, are negative.  Past Medical History:  Diagnosis Date   Alcohol withdrawal (HCC) 10/24/2019   Allergy    Anxiety    history of PTSD following CABG   Ascending aortic aneurysm 01/31/2018   43 x 42 mm, pt unaware    Asthma    Cardiomegaly 10/17/2017   Colitis- colonoscopy 2014 07/13/2015   COPD (chronic obstructive pulmonary disease) (HCC)    Coronary artery disease    x 6   Depression    Diabetes mellitus without complication (HCC)    Family history of polyps in the colon    Finger dislocation    Left pinkie   GERD (gastroesophageal reflux disease)    Gout    H/O atrial fibrillation without current medication    following CABG with no documented episodes since then.   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    Past Surgical History:  Procedure Laterality Date   ANKLE FRACTURE SURGERY Right 1991   APPENDECTOMY     ATRIAL FIBRILLATION ABLATION N/A 03/31/2018   Procedure: ATRIAL FIBRILLATION ABLATION;  Surgeon: Allred, James, MD;  Location: [facility];  Service: Cardiovascular;  Laterality: N/A;   CARDIAC ELECTROPHYSIOLOGY MAPPING AND ABLATION     CARDIOVERSION N/A 03/23/2021   Procedure: CARDIOVERSION;  Surgeon: Nahser, Philip J, MD;  Location: [facility];  Service: Cardiovascular;  Laterality: N/A;   COLONOSCOPY    06/11/2021   COLONOSCOPY W/ BIOPSIES  2017   x7   CORONARY ANGIOPLASTY WITH STENT PLACEMENT     CORONARY ARTERY BYPASS GRAFT     FINGER SURGERY  04/2018   Small finger left hand   implantable loop recorder placement  07/02/2019   [employer]onic Reveal Donora model LNQ11 (SN [id number]) implanted in office by Dr Rayann Heman   TEE WITHOUT CARDIOVERSION N/A 03/30/2018   Procedure: TRANSESOPHAGEAL ECHOCARDIOGRAM (TEE);  Surgeon: [name], MD;  Location: [place];  Service: Cardiovascular;  Laterality: N/A;   TEE WITHOUT CARDIOVERSION N/A 03/23/2021   Procedure: TRANSESOPHAGEAL  ECHOCARDIOGRAM (TEE);  Surgeon: Acie Fredrickson Wonda Cheng, MD;  Location: [facility] ENDOSCOPY;  Service: Cardiovascular;  Laterality: N/A;   TOTAL HIP ARTHROPLASTY Left    TOTAL HIP ARTHROPLASTY Right 09/12/2018   Procedure: TOTAL HIP ARTHROPLASTY ANTERIOR APPROACH;  Surgeon: [name], MD;  Location: [facility];  Service: Orthopedics;  Laterality: Right;  70 mins     reports that he quit smoking about 43 years ago. His smoking use included cigarettes. He has a 7.00 pack-year smoking history. He has never used smokeless tobacco. He reports current alcohol use of about 3.0 - 4.0 standard drinks per week. He reports that he does not use drugs.  Allergies  Allergen Reactions   Xarelto [Rivaroxaban] Other (See Comments)    Patient stated he "ended up in the hospital with a rectal bleed"   Amoxicillin Rash and Other (See Comments)    RASH IN GROIN AREA Has patient had a PCN reaction causing immediate rash, facial/tongue/throat swelling, SOB or lightheadedness with hypotension: no Has patient had a PCN reaction causing severe rash involving mucus membranes or skin necrosis: no Has patient had a PCN reaction that required hospitalization: no Has patient had a PCN reaction occurring within the last 10 years: yes If all of the above answers are "NO", then may proceed with Cephalosporin use.    Augmentin [Amoxicillin-Pot Clavulanate] Rash and Other (See Comments)    RASH IN GROIN AREA   Azithromycin Rash and Other (See Comments)    RASH IN GROIN AREA   Clindamycin/Lincomycin Rash   Keflex [Cephalexin] Rash    Family History  Problem Relation Age of Onset   Colon cancer Mother    Cancer Father        UNKNOWN TYPE   Heart disease Paternal Grandmother    Heart disease Paternal Grandfather    Esophageal cancer Neg Hx    Rectal cancer Neg Hx    Stomach cancer Neg Hx     Prior to Admission medications   Medication Sig Start Date End Date Taking? Authorizing Provider  acetaminophen (TYLENOL) 325 MG tablet  Take 325 mg by mouth every 8 (eight) hours as needed for moderate pain.    [provider]  allopurinol (ZYLOPRIM) 300 MG tablet TAKE 1 TABLET(300 MG) BY MOUTH DAILY 12/05/20   [name], Alinda Sierras, MD  amiodarone (PACERONE) 200 MG tablet Take 1 tablet (200 mg total) by mouth daily. 04/21/21   [name], NP  B COMPLEX-C-FOLIC ACID PO Take 1 tablet by mouth daily.    [provider]  Blood Glucose Monitoring Suppl (ONETOUCH VERIO) w/Device KIT Use 1-4 times daily as needed/directed DX E11.9 09/29/20   [name], Alinda Sierras, MD  divalproex (DEPAKOTE SPRINKLE) 125 MG capsule Take 125 mg by mouth 3 (three) times daily. 04/21/21   [provider]  DULoxetine (CYMBALTA) 60 MG capsule TAKE 1 CAPSULE(60 MG) BY MOUTH DAILY 03/19/21   [name],  Alinda Sierras, MD  ELIQUIS 5 MG TABS tablet TAKE 1 TABLET(5 MG) BY MOUTH TWICE DAILY 03/04/21   [name], Alinda Sierras, MD  Fluticasone-Umeclidin-Vilant (TRELEGY ELLIPTA) 100-62.5-25 MCG/INH AEPB Inhale 1 puff into the lungs daily. 11/11/20   [name], MD  gabapentin (NEURONTIN) 300 MG capsule TAKE 2 CAPSULES BY MOUTH AT NIGHT AS NEEDED FOR RESTLESS LEGS OR SYMPTOMS 06/30/21   [name], Alinda Sierras, MD  HM LIDOCAINE PATCH EX Apply 1 patch topically as needed.    [provider]  Insulin Pen Needle (NOVOFINE PEN NEEDLE) 32G X 6 MM MISC Use 1-4 times daily as needed for insulin 09/15/20   [name], Alinda Sierras, MD  JANUVIA 100 MG tablet TAKE 1 TABLET(100 MG) BY MOUTH DAILY 06/02/21   [name], Alinda Sierras, MD  JARDIANCE 25 MG TABS tablet TAKE 1 TABLET(25 MG) BY MOUTH DAILY BEFORE AND BREAKFAST 03/04/21   [name], Alinda Sierras, MD  Lancets [facility] ULTRASOFT) lancets Use 1-4 times daily as needed/directed  DX E11.9 09/29/20   [name], Alinda Sierras, MD  Magnesium Oxide 400 MG CAPS Take 1 capsule (400 mg total) by mouth daily. Patient not taking: Reported on 07/30/2021 09/15/20   [name], MD  metFORMIN (GLUCOPHAGE-XR) 500 MG 24 hr tablet Take 1 tablet  (500 mg total) by mouth in the morning and at bedtime. 09/23/20   [name], Alinda Sierras, MD  metoprolol tartrate (LOPRESSOR) 50 MG tablet Take 1 tablet (50 mg total) by mouth 2 (two) times daily. 04/21/21 07/30/21  [name], NP  montelukast (SINGULAIR) 10 MG tablet TAKE 1 TABLET BY MOUTH AT BEDTIME 12/05/20   [name], Alinda Sierras, MD  Multiple Vitamin (MULTIVITAMIN WITH MINERALS) TABS tablet Take 1 tablet by mouth daily. 03/04/20   Mercy Riding, MD  ONETOUCH VERIO test strip Use 1-4 times daily as directed/needed   DX E11.9 09/29/20   [name], Alinda Sierras, MD  rosuvastatin (CRESTOR) 40 MG tablet Take 40 mg by mouth daily.    [provider]  thiamine (VITAMIN B-1) 100 MG tablet Take 500 mg by mouth daily.    [provider]  TOUJEO SOLOSTAR 300 UNIT/ML Solostar Pen Inject 80 Units into the skin every morning. 06/29/21   [provider]    Physical Exam: Vitals:   07/30/21 1830 07/30/21 2100 07/31/21 0352 07/31/21 0400  BP: 122/70 128/65 (!) 155/79 (!) 146/85  Pulse: 77 80 79 78  Resp: (!) _0 Temp:   97.7 F (36.5 C)   TempSrc:   Oral   SpO2: 96% 99% 98%   Weight:      Height:        Physical Exam Constitutional:      General: He is not in acute distress. HENT:     Head: Normocephalic and atraumatic.  Eyes:     Extraocular Movements: Extraocular movements intact.     Conjunctiva/sclera: Conjunctivae normal.  Cardiovascular:     Rate and Rhythm: Normal rate and regular rhythm.     Pulses: Normal pulses.  Pulmonary:     Effort: Pulmonary effort is normal. No respiratory distress.     Breath sounds: No wheezing or rales.  Abdominal:     General: Bowel sounds are normal. There is no distension.     Palpations: Abdomen is soft.     Tenderness: There is no abdominal tenderness.  Musculoskeletal:        General: No swelling or tenderness.     Cervical back: Normal range of  motion and neck supple.  Skin:    General: Skin is warm and dry.   Neurological:     Mental Status: He is alert and oriented to person, place, and time.     Sensory: No sensory deficit.     Motor: No weakness.     Comments: Speech dysarthric     Labs on Admission: I have personally reviewed following labs and imaging studies  CBC: Recent Labs  Lab 07/30/21 1642  WBC 6.5  NEUTROABS 3.7  HGB 13.0  HCT 37.3*  MCV 92.6  PLT 218   Basic Metabolic Panel: Recent Labs  Lab 07/30/21 1339 07/30/21 1642 07/30/21 2101 07/31/21 0459  NA 139 139  --  137  K 2.4* 2.4* 2.6* 2.5*  CL 97* 95*  --  101  CO2 29 30  --  28  GLUCOSE 123* 171*  --  152*  BUN 7* 8  --  7*  CREATININE 1.06 0.99  --  0.96  CALCIUM 9.1 9.1  --  8.5*  MG  --  2.0  --   --    GFR: Estimated Creatinine Clearance: 86.2 mL/min (by C-G formula based on SCr of 0.96 mg/dL). Liver Function Tests: Recent Labs  Lab 07/30/21 1339 07/30/21 1642  AST 39 28  ALT 22 16  ALKPHOS 49 48  BILITOT 1.1 0.9  PROT 6.9 6.9  ALBUMIN 3.4* 3.9   No results for input(s): LIPASE, AMYLASE in the last 168 hours. No results for input(s): AMMONIA in the last 168 hours. Coagulation Profile: No results for input(s): INR, PROTIME in the last 168 hours. Cardiac Enzymes: No results for input(s): CKTOTAL, CKMB, CKMBINDEX, TROPONINI in the last 168 hours. BNP (last 3 results) No results for input(s): PROBNP in the last 8760 hours. HbA1C: No results for input(s): HGBA1C in the last 72 hours. CBG: No results for input(s): GLUCAP in the last 168 hours. Lipid Profile: No results for input(s): CHOL, HDL, LDLCALC, TRIG, CHOLHDL, LDLDIRECT in the last 72 hours. Thyroid Function Tests: Recent Labs    07/30/21 1340  TSH 1.984   Anemia Panel: No results for input(s): VITAMINB12, FOLATE, FERRITIN, TIBC, IRON, RETICCTPCT in the last 72 hours. Urine analysis:    Component Value Date/Time   COLORURINE AMBER (A) 02/20/2020 1957   APPEARANCEUR HAZY (A) 02/20/2020 1957   LABSPEC 1.020 02/20/2020 1957    PHURINE 5.0 02/20/2020 1957   GLUCOSEU 50 (A) 02/20/2020 1957   HGBUR NEGATIVE 02/20/2020 1957   BILIRUBINUR NEGATIVE 02/20/2020 1957   BILIRUBINUR 1+ 08/14/2013 1200   KETONESUR NEGATIVE 02/20/2020 1957   PROTEINUR 30 (A) 02/20/2020 1957   UROBILINOGEN 0.2 08/14/2013 1200   NITRITE NEGATIVE 02/20/2020 1957   LEUKOCYTESUR TRACE (A) 02/20/2020 1957    Radiological Exams on Admission: No results found.  Assessment/Plan Principal Problem:   Hypokalemia Active Problems:   CAD (coronary artery disease)   Hypertension associated with diabetes (HCC)   GERD (gastroesophageal reflux disease)   Gout   Hypokalemia with QT prolongation Likely due to chronic alcohol use.  He is supposed to be taking a potassium supplement at home but stopped taking it.  Potassium 2.4 on initial labs done in the ED, magnesium normal.  EKG done at A. fib clinic showing worsening QT prolongation.  Patient was given oral potassium 40 mEq and IV potassium 30 mEq.  Repeat labs showing only minimal improvement with potassium 2.6.  Patient was given additional IV potassium 30 mEq.   -Continue to replace potassium and   monitor BMP closely.  Repeat EKG to check QT interval.  COVID-19 positive Screening SARS-CoV-2 PCR test positive.  Patient is not hypoxic, satting in the mid to upper 90s on room air.  He is endorsing cough and shortness of breath.  His wife recently had COVID infection.  He is vaccinated and has received 1 booster shot. -Airborne and contact precautions.  Chest x-ray ordered.  Check ferritin, fibrinogen, D-dimer, CRP, and LDH levels.  Insulin-dependent type 2 diabetes -Check A1c.  Sliding scale insulin moderate ACHS.  Resume home basal insulin after pharmacy med rec is done.  Alcohol abuse No signs of withdrawal at this time. -CIWA protocol; Ativan as needed.  Thiamine, folate, and multivitamin.  Dysarthria Ongoing for several months and he has an appointment scheduled with neurology on Wednesday.   No other focal neurodeficit.  CAD: Stable.  Not endorsing any chest pain. Persistent A. Fib: Stable.  Currently in sinus rhythm. COPD: Stable.  Not wheezing. Hypertension: Blood pressure currently stable. Hyperlipidemia Mood disorder GERD History of gout -Resume home meds after pharmacy med rec is done.  DVT prophylaxis: Resume Eliquis after pharmacy med rec is done. Code Status: Patient wishes to be full code. Family Communication: No family available at this time. Disposition Plan: Status is: Observation  The patient remains OBS appropriate and will d/c before 2 midnights.  Level of care: Level of care: Telemetry  The medical decision making on this patient was of high complexity and the patient is at high risk for clinical deterioration, therefore this is a level 3 visit.    MD [facility]  If 7PM-7AM, please contact night-coverage [email or web address]  07/31/2021, 6:48 AM    

## 2021-07-31 NOTE — ED Notes (Signed)
[facility] called for transport arrangements.

## 2021-07-31 NOTE — Progress Notes (Signed)
PROGRESS NOTE                                                                                                                                                                                                             Patient Demographics:    Adam [name], is a 70 y.o. male, DOB - 1951-07-18, FAO:[id number]  Outpatient Primary MD for the patient is [name], MD    LOS - 0  Admit date - 07/30/2021    CC - Hypokalemia     Brief Narrative (HPI from H&P)   Adam [name] is a 70 y.o. male with medical history significant of dysarthria due to underlying Wernicke's encephalopathy from ongoing heavy alcohol abuse, CAD status post CABG and stenting, persistent A. fib on Eliquis, COPD, hypertension, hyperlipidemia, insulin-dependent type 2 diabetes, alcohol abuse, OSA does not use CPAP, obesity (BMI 33.60), mood disorder, GERD, gout.  He was sent to the ED yesterday from A. fib clinic as labs revealed hypokalemia with potassium 2.6.  EKG done at A. fib clinic showing sinus rhythm and worsening QT prolongation (QTC 592).  Labs done in the ED revealed potassium 2.4, he is post 4 Covid shots, kept in the Hospital for severe Hypokalemia.   Subjective:    Adam [name] today has, No headache, No chest pain, No abdominal pain - No Nausea, No new weakness tingling or numbness, no SOB.   Assessment  & Plan :     Severe hypokalemia causing prolonged QTC - Likely due to poor oral intake and ongoing alcohol abuse, no emesis - diarrhea or diuretics, aggressive replacements, Mag Ok, no PPI, monitor QTc.  2. Incidental Covid 19 infection - stable, fully vaccinated, monitor.  3.  Chronic Wernicke's encephalopathy with dysarthria -has outpatient neurology follow-up, no acute issue, continue replacement of thiamine and folic acid counseled to quit alcohol  4.  Severe ongoing alcohol abuse.  Discussed with patient wife, counseled to  quit, CIWA protocol and Librium.  5.  Persistent A. fib.  Mali vas 2 score of greater than 4.  Continue beta-blocker amiodarone and Eliquis.  6.  Hypertension.  Currently on beta-blocker monitor and adjust.  7.  Dyslipidemia.  On statin.  8.  History of gout.  On allopurinol.  9.  COPD.  Stable no acute issues supportive care.  10.  DM type  II.  Lantus and sliding scale monitor and adjust  Lab Results  Component Value Date   HGBA1C 6.4 (A) 06/24/2021   CBG (last 3)  Recent Labs    07/31/21 1022 07/31/21 1152  GLUCAP 154* 141*    Recent Labs  Lab 07/30/21 1339 07/30/21 1642 07/30/21 2230 07/31/21 0631  WBC  --  6.5  --   --   HGB  --  13.0  --   --   HCT  --  37.3*  --   --   PLT  --  218  --   --   CRP  --   --   --  0.9  DDIMER  --   --   --  1.86*  AST 39 28  --   --   ALT 22 16  --   --   ALKPHOS 49 48  --   --   BILITOT 1.1 0.9  --   --   ALBUMIN 3.4* 3.9  --   --   SARSCOV2NAA  --   --  POSITIVE*  --         Condition - Extremely Guarded  Family Communication  :  Wife [phone] on 07/31/21  Code Status :  Full  Consults  :   None  PUD Prophylaxis : None   Procedures  :           Disposition Plan  :    Status is: Observation  DVT Prophylaxis  :    apixaban (ELIQUIS) tablet 5 mg     Lab Results  Component Value Date   PLT 218 07/30/2021    Diet :  Diet Order             Diet heart healthy/carb modified Room service appropriate? Yes; Fluid consistency: Thin  Diet effective now                    Inpatient Medications  Scheduled Meds:  allopurinol  100 mg Oral Daily   amiodarone  200 mg Oral Daily   apixaban  5 mg Oral BID   divalproex  125 mg Oral TID   folic acid  1 mg Oral Daily   insulin aspart  0-15 Units Subcutaneous TID WC   insulin aspart  0-5 Units Subcutaneous QHS   insulin glargine-yfgn  30 Units Subcutaneous Daily   magnesium oxide  400 mg Oral Daily   metoprolol tartrate  50 mg Oral BID    multivitamin with minerals  1 tablet Oral Daily   potassium chloride  40 mEq Oral BID   rosuvastatin  40 mg Oral Daily   thiamine  100 mg Intravenous Daily   Continuous Infusions:  lactated ringers with kcl 100 mL/hr at 07/31/21 1026   PRN Meds:.acetaminophen **OR** acetaminophen, LORazepam **OR** LORazepam  Antibiotics  :    Anti-infectives (From admission, onward)    None        Time Spent in minutes  30   Lala Lund M.D on 07/31/2021 at 11:44 AM  To page go to [email or web address]   [facility] -  Office  [phone]  See all Orders from today for further details    Objective:   Vitals:   07/30/21 2100 07/31/21 0352 07/31/21 0400 07/31/21 0738  BP: 128/65 (!) 155/79 (!) 146/85 (!) 155/77  Pulse: 80 79 78 80  Resp: 19 19  20   Temp:  97.7 F (36.5 C)  (!) 97.5 F (36.4 C)  TempSrc:  Oral  Axillary  SpO2: 99% 98%  97%  Weight:      Height:        Wt Readings from Last 3 Encounters:  07/30/21 106.2 kg  07/30/21 106.2 kg  06/24/21 108.8 kg     Intake/Output Summary (Last 24 hours) at 07/31/2021 1144 Last data filed at 07/31/2021 0807 Gross per 24 hour  Intake 360 ml  Output --  Net 360 ml     Physical Exam  Awake Alert, No new F.N deficits, Normal affect [place].AT,PERRAL Supple Neck, No JVD,   Symmetrical Chest wall movement, Good air movement bilaterally, CTAB RRR,No Gallops,Rubs or new Murmurs,  +ve B.Sounds, Abd Soft, No tenderness,   No Cyanosis, Clubbing or edema,      RN pressure injury documentation: Pressure Injury 02/20/20 Sacrum Medial Stage 2 -  Partial thickness loss of dermis presenting as a shallow open injury with a red, pink wound bed without slough. (Active)  02/20/20 2249  Location: Sacrum  Location Orientation: Medial  Staging: Stage 2 -  Partial thickness loss of dermis presenting as a shallow open injury with a red, pink wound bed without slough.  Wound Description (Comments):   Present on Admission: Yes     Data  Review:    CBC Recent Labs  Lab 07/30/21 1642  WBC 6.5  HGB 13.0  HCT 37.3*  PLT 218  MCV 92.6  MCH 32.3  MCHC 34.9  RDW 15.4  LYMPHSABS 1.5  MONOABS 1.0  EOSABS 0.1  BASOSABS 0.0    Recent Labs  Lab 07/30/21 1339 07/30/21 1340 07/30/21 1642 07/30/21 2101 07/31/21 0459 07/31/21 0631  NA 139  --  139  --  137  --   K 2.4*  --  2.4* 2.6* 2.5*  --   CL 97*  --  95*  --  101  --   CO2 29  --  30  --  28  --   GLUCOSE 123*  --  171*  --  152*  --   BUN 7*  --  8  --  7*  --   CREATININE 1.06  --  0.99  --  0.96  --   CALCIUM 9.1  --  9.1  --  8.5*  --   AST 39  --  28  --   --   --   ALT 22  --  16  --   --   --   ALKPHOS 49  --  48  --   --   --   BILITOT 1.1  --  0.9  --   --   --   ALBUMIN 3.4*  --  3.9  --   --   --   MG  --   --  2.0  --   --   --   CRP  --   --   --   --   --  0.9  DDIMER  --   --   --   --   --  1.86*  TSH  --  1.984  --   --   --   --     ------------------------------------------------------------------------------------------------------------------ No results for input(s): CHOL, HDL, LDLCALC, TRIG, CHOLHDL, LDLDIRECT in the last 72 hours.  Lab Results  Component Value Date   HGBA1C 6.4 (A) 06/24/2021   ------------------------------------------------------------------------------------------------------------------ Recent Labs    07/30/21 1340  TSH 1.984    Cardiac Enzymes No results for input(s): CKMB, TROPONINI,  MYOGLOBIN in the last 168 hours.  Invalid input(s): CK ------------------------------------------------------------------------------------------------------------------    Component Value Date/Time   BNP 429.5 (H) 04/05/2021 0220    Radiology Reports DG CHEST PORT 1 VIEW  Result Date: 07/31/2021 CLINICAL DATA:  Shortness of breath, COVID EXAM: PORTABLE CHEST 1 VIEW COMPARISON:  Radiograph 04/05/2021 FINDINGS: Unchanged cardiomediastinal silhouette with postsurgical changes. Unchanged fractured upper median  sternotomy wires. Loop recorder overlies the left chest. Coronary artery calcifications. Slight improvement in interstitial edema. No new airspace disease. No large pleural effusion or visible pneumothorax. No acute osseous abnormality. IMPRESSION: Slight improvement in interstitial edema.  No new airspace disease. Electronically Signed   By: Maurine Simmering M.D.   On: 07/31/2021 08:01   CUP PACEART REMOTE DEVICE CHECK  Result Date: 07/15/2021 ILR summary report received. Battery status OK. Normal device function. No new symptom, brady, or pause episodes. No new AF episodes. Monthly summary reports and ROV/PRN 1 new tachy event, duration 33min 12 sec, rate 188.  Likely AF with RVR Burden 1.2% Amiodarone, Metoprolol, Eliquis prescribed. LR

## 2021-07-31 NOTE — ED Notes (Signed)
Patient report called Lovey Newcomer, RN

## 2021-08-01 DIAGNOSIS — E876 Hypokalemia: Secondary | ICD-10-CM | POA: Diagnosis not present

## 2021-08-01 LAB — POTASSIUM: Potassium: 3.3 mmol/L — ABNORMAL LOW (ref 3.5–5.1)

## 2021-08-01 LAB — CBC
HCT: 34.4 % — ABNORMAL LOW (ref 39.0–52.0)
Hemoglobin: 11.9 g/dL — ABNORMAL LOW (ref 13.0–17.0)
MCH: 33.2 pg (ref 26.0–34.0)
MCHC: 34.6 g/dL (ref 30.0–36.0)
MCV: 96.1 fL (ref 80.0–100.0)
Platelets: 187 10*3/uL (ref 150–400)
RBC: 3.58 MIL/uL — ABNORMAL LOW (ref 4.22–5.81)
RDW: 15.6 % — ABNORMAL HIGH (ref 11.5–15.5)
WBC: 5.7 10*3/uL (ref 4.0–10.5)
nRBC: 0 % (ref 0.0–0.2)

## 2021-08-01 LAB — BASIC METABOLIC PANEL
Anion gap: 8 (ref 5–15)
BUN: 5 mg/dL — ABNORMAL LOW (ref 8–23)
CO2: 26 mmol/L (ref 22–32)
Calcium: 8.4 mg/dL — ABNORMAL LOW (ref 8.9–10.3)
Chloride: 104 mmol/L (ref 98–111)
Creatinine, Ser: 0.84 mg/dL (ref 0.61–1.24)
GFR, Estimated: >60 mL/min (ref >60)
Glucose, Bld: 120 mg/dL — ABNORMAL HIGH (ref 70–99)
Potassium: 3 mmol/L — ABNORMAL LOW (ref 3.5–5.1)
Sodium: 138 mmol/L (ref 135–145)

## 2021-08-01 LAB — GLUCOSE, CAPILLARY
Glucose-Capillary: 134 mg/dL — ABNORMAL HIGH (ref 70–99)
Glucose-Capillary: 103 mg/dL — ABNORMAL HIGH (ref 70–99)
Glucose-Capillary: 171 mg/dL — ABNORMAL HIGH (ref 70–99)
Glucose-Capillary: 102 mg/dL — ABNORMAL HIGH (ref 70–99)

## 2021-08-01 MED ORDER — POTASSIUM CHLORIDE CRYS ER 20 MEQ PO TBCR
40.0000 meq | EXTENDED_RELEASE_TABLET | Freq: Once | ORAL | Status: AC
  Administered 2021-08-01: 40 meq via ORAL
  Filled 2021-08-01: qty 2

## 2021-08-01 MED ORDER — AMLODIPINE BESYLATE 10 MG PO TABS
10.0000 mg | ORAL_TABLET | Freq: Every day | ORAL | Status: DC
  Administered 2021-08-01 – 2021-08-02 (×2): 10 mg via ORAL
  Filled 2021-08-01 (×2): qty 1

## 2021-08-01 MED ORDER — POTASSIUM CHLORIDE CRYS ER 20 MEQ PO TBCR: 40.0000 meq | EXTENDED_RELEASE_TABLET | Freq: Once | ORAL | Status: DC

## 2021-08-01 NOTE — TOC CAGE-AID Note (Signed)
Transition of Care [facility]) - CAGE-AID Screening   Patient Details  Name: Adam [name] MRN: [id number] Date of Birth: [birth year]  [facility] of Care [facility] CM/SW Contact:    Joanne Chars, LCSW Phone Number: 08/01/2021, 3:55 PM   Clinical Narrative:CSW attempted to meet with pt for Cage Aid.  Pt is covid positive.  Resource list left with pt RN, who will provide to pt.  CSW attempted to contact pt by phone to complete Cage Aid but no answer.    CAGE-AID Screening:

## 2021-08-01 NOTE — Progress Notes (Signed)
PROGRESS NOTE                                                                                                                                                                                                             Patient Demographics:    Adam [name], is a 70 y.o. male, DOB - October 20, 1950, JTT:[id number]  Outpatient Primary MD for the patient is [name], MD    LOS - 1  Admit date - 07/30/2021    CC - Hypokalemia     Brief Narrative (HPI from H&P)   Adam [name] is a 70 y.o. male with medical history significant of dysarthria due to underlying Wernicke's encephalopathy from ongoing heavy alcohol abuse, CAD status post CABG and stenting, persistent A. fib on Eliquis, COPD, hypertension, hyperlipidemia, insulin-dependent type 2 diabetes, alcohol abuse, OSA does not use CPAP, obesity (BMI 33.60), mood disorder, GERD, gout.  He was sent to the ED yesterday from [facility]. fib clinic as labs revealed hypokalemia with potassium 2.6.  EKG done at [facility] fib clinic showing sinus rhythm and worsening QT prolongation (QTC 592).  Labs done in the ED revealed potassium 2.4, he is post 4 Covid shots, kept in the Hospital for severe Hypokalemia.   Subjective:   Patient in bed, appears comfortable, denies any headache, no fever, no chest pain or pressure, no shortness of breath , no abdominal pain. No focal weakness.   Assessment  & Plan :     Severe hypokalemia causing prolonged QTC - Likely due to poor oral intake and ongoing alcohol abuse, no emesis - diarrhea or diuretics, aggressive replacements, Mag Ok, no PPI, QTc improving, replace potassium further on 08/01/2021 and continue to monitor, repeat EKG morning of 08/02/2021.  2. Incidental Covid 19 infection - stable, fully vaccinated, monitor.  3.  Chronic Wernicke's encephalopathy with dysarthria -has outpatient neurology follow-up, no acute issue, continue replacement of  thiamine and folic acid counseled to quit alcohol  4.  Severe ongoing alcohol abuse.  Discussed with patient wife, counseled to quit, CIWA protocol and Librium.  5.  Persistent A. fib.  Mali vas 2 score of greater than 4.  Continue beta-blocker amiodarone and Eliquis.  6.  Hypertension.  Currently on beta-blocker monitor and adjust.  7.  Dyslipidemia.  On statin.  8.  History of gout.  On allopurinol.  9.  COPD.  Stable no acute issues supportive care.  10.  DM type II.  Lantus and sliding scale monitor and adjust  Lab Results  Component Value Date   HGBA1C 6.4 (A) 06/24/2021   CBG (last 3)  Recent Labs    07/31/21 1605 07/31/21 2108 08/01/21 0834  GLUCAP 150* 142* 134*    Recent Labs  Lab 07/30/21 1339 07/30/21 1642 07/30/21 2230 07/31/21 0631 08/01/21 0909  WBC  --  6.5  --   --  5.7  HGB  --  13.0  --   --  11.9*  HCT  --  37.3*  --   --  34.4*  PLT  --  218  --   --  187  CRP  --   --   --  0.9  --   DDIMER  --   --   --  1.86*  --   AST 39 28  --   --   --   ALT 22 16  --   --   --   ALKPHOS 49 48  --   --   --   BILITOT 1.1 0.9  --   --   --   ALBUMIN 3.4* 3.9  --   --   --   SARSCOV2NAA  --   --  POSITIVE*  --   --         Condition - Extremely Guarded  Family Communication  :  Wife [phone] on 07/31/21  Code Status :  Full  Consults  :   None  PUD Prophylaxis : None   Procedures  :           Disposition Plan  :    Status is: Observation  DVT Prophylaxis  :    apixaban (ELIQUIS) tablet 5 mg     Lab Results  Component Value Date   PLT 187 08/01/2021    Diet :  Diet Order             Diet heart healthy/carb modified Room service appropriate? Yes; Fluid consistency: Thin  Diet effective now                    Inpatient Medications  Scheduled Meds:  allopurinol  100 mg Oral Daily   amiodarone  200 mg Oral Daily   apixaban  5 mg Oral BID   chlordiazePOXIDE  15 mg Oral TID   divalproex  125 mg Oral TID   folic  acid  1 mg Oral Daily   insulin aspart  0-15 Units Subcutaneous TID WC   insulin aspart  0-5 Units Subcutaneous QHS   insulin glargine-yfgn  30 Units Subcutaneous Daily   magnesium oxide  400 mg Oral Daily   metoprolol tartrate  50 mg Oral BID   multivitamin with minerals  1 tablet Oral Daily   potassium chloride  40 mEq Oral Once   rosuvastatin  40 mg Oral Daily   thiamine  100 mg Intravenous Daily   Continuous Infusions:   PRN Meds:.acetaminophen **OR** acetaminophen, gabapentin, LORazepam **OR** LORazepam  Antibiotics  :    Anti-infectives (From admission, onward)    None        Time Spent in minutes  30   Lala Lund M.D on 08/01/2021 at 10:33 AM  To page go to [email or web address]   [facility] -  Office  [phone]  See all Orders from today for further details    Objective:  Vitals:   07/31/21 2000 08/01/21 0000 08/01/21 0406 08/01/21 0831  BP: (!) 155/77 (!) 172/105 (!) 169/95 (!) 165/86  Pulse: 79 75 79 81  Resp: 20 19 20 19   Temp: (!) 97.3 F (36.3 C)  98 F (36.7 C) 97.8 F (36.6 C)  TempSrc: Axillary Oral Axillary Oral  SpO2: 98% 95% 94% 96%  Weight:      Height:        Wt Readings from Last 3 Encounters:  07/30/21 106.2 kg  07/30/21 106.2 kg  06/24/21 108.8 kg     Intake/Output Summary (Last 24 hours) at 08/01/2021 1033 Last data filed at 07/31/2021 2000 Gross per 24 hour  Intake 358 ml  Output --  Net 358 ml     Physical Exam  Awake Alert, No new F.N deficits, mild baseline dysarthria [place].AT,PERRAL Supple Neck, No JVD,   Symmetrical Chest wall movement, Good air movement bilaterally, CTAB RRR,No Gallops, Rubs or new Murmurs,  +ve B.Sounds, Abd Soft, No tenderness,   No Cyanosis, Clubbing or edema,     Data Review:    CBC Recent Labs  Lab 07/30/21 1642 08/01/21 0909  WBC 6.5 5.7  HGB 13.0 11.9*  HCT 37.3* 34.4*  PLT 218 187  MCV 92.6 96.1  MCH 32.3 33.2  MCHC 34.9 34.6  RDW 15.4 15.6*  LYMPHSABS 1.5  --    MONOABS 1.0  --   EOSABS 0.1  --   BASOSABS 0.0  --     Recent Labs  Lab 07/30/21 1339 07/30/21 1340 07/30/21 1642 07/30/21 2101 07/31/21 0459 07/31/21 0631 07/31/21 1837 08/01/21 0909  NA 139  --  139  --  137  --   --  138  K 2.4*  --  2.4* 2.6* 2.5*  --  3.0* 3.0*  CL 97*  --  95*  --  101  --   --  104  CO2 29  --  30  --  28  --   --  26  GLUCOSE 123*  --  171*  --  152*  --   --  120*  BUN 7*  --  8  --  7*  --   --  5*  CREATININE 1.06  --  0.99  --  0.96  --   --  0.84  CALCIUM 9.1  --  9.1  --  8.5*  --   --  8.4*  AST 39  --  28  --   --   --   --   --   ALT 22  --  16  --   --   --   --   --   ALKPHOS 49  --  48  --   --   --   --   --   BILITOT 1.1  --  0.9  --   --   --   --   --   ALBUMIN 3.4*  --  3.9  --   --   --   --   --   MG  --   --  2.0  --   --   --   --   --   CRP  --   --   --   --   --  0.9  --   --   DDIMER  --   --   --   --   --  1.86*  --   --  TSH  --  1.984  --   --   --   --   --   --     ------------------------------------------------------------------------------------------------------------------ No results for input(s): CHOL, HDL, LDLCALC, TRIG, CHOLHDL, LDLDIRECT in the last 72 hours.  Lab Results  Component Value Date   HGBA1C 6.4 (A) 06/24/2021   ------------------------------------------------------------------------------------------------------------------ Recent Labs    07/30/21 1340  TSH 1.984    Cardiac Enzymes No results for input(s): CKMB, TROPONINI, MYOGLOBIN in the last 168 hours.  Invalid input(s): CK ------------------------------------------------------------------------------------------------------------------    Component Value Date/Time   BNP 429.5 (H) 04/05/2021 0220    Radiology Reports DG CHEST PORT 1 VIEW  Result Date: 07/31/2021 CLINICAL DATA:  Shortness of breath, COVID EXAM: PORTABLE CHEST 1 VIEW COMPARISON:  Radiograph 04/05/2021 FINDINGS: Unchanged cardiomediastinal silhouette with  postsurgical changes. Unchanged fractured upper median sternotomy wires. Loop recorder overlies the left chest. Coronary artery calcifications. Slight improvement in interstitial edema. No new airspace disease. No large pleural effusion or visible pneumothorax. No acute osseous abnormality. IMPRESSION: Slight improvement in interstitial edema.  No new airspace disease. Electronically Signed   By: Maurine Simmering M.D.   On: 07/31/2021 08:01   CUP PACEART REMOTE DEVICE CHECK  Result Date: 07/15/2021 ILR summary report received. Battery status OK. Normal device function. No new symptom, brady, or pause episodes. No new AF episodes. Monthly summary reports and ROV/PRN 1 new tachy event, duration 84min 12 sec, rate 188.  Likely AF with RVR Burden 1.2% Amiodarone, Metoprolol, Eliquis prescribed. LR

## 2021-08-02 DIAGNOSIS — E876 Hypokalemia: Secondary | ICD-10-CM | POA: Diagnosis not present

## 2021-08-02 LAB — GLUCOSE, CAPILLARY: Glucose-Capillary: 136 mg/dL — ABNORMAL HIGH (ref 70–99)

## 2021-08-02 LAB — BASIC METABOLIC PANEL
Anion gap: 7 (ref 5–15)
BUN: <5 mg/dL — ABNORMAL LOW (ref 8–23)
CO2: 24 mmol/L (ref 22–32)
Calcium: 9 mg/dL (ref 8.9–10.3)
Chloride: 108 mmol/L (ref 98–111)
Creatinine, Ser: 0.91 mg/dL (ref 0.61–1.24)
GFR, Estimated: >60 mL/min (ref >60)
Glucose, Bld: 123 mg/dL — ABNORMAL HIGH (ref 70–99)
Potassium: 3.5 mmol/L (ref 3.5–5.1)
Sodium: 139 mmol/L (ref 135–145)

## 2021-08-02 LAB — MAGNESIUM: Magnesium: 2 mg/dL (ref 1.7–2.4)

## 2021-08-02 MED ORDER — POTASSIUM CHLORIDE CRYS ER 20 MEQ PO TBCR
40.0000 meq | EXTENDED_RELEASE_TABLET | Freq: Once | ORAL | Status: AC
  Administered 2021-08-02: 40 meq via ORAL
  Filled 2021-08-02: qty 2

## 2021-08-02 NOTE — Discharge Summary (Signed)
Adam [name] BVQ:[id number] DOB: 09-19-1951 DOA: 07/30/2021  PCP: [name], MD  Admit date: 07/30/2021  Discharge date: 08/02/2021  Admitted From: Home   Disposition:  Home   Recommendations for Outpatient Follow-up:   Follow up with PCP in 1-2 weeks  PCP Please obtain BMP/CBC, 2 view CXR in 1week,  (see Discharge instructions)   PCP Please follow up on the following pending results: monitor BMP, Qtc,  closely   Home Health: None   Equipment/Devices: None  Consultations: None  Discharge Condition: Stable    CODE STATUS: Full    Diet Recommendation: Heart Healthy Low Carb  CC - abnormal labs    Brief history of present illness from the day of admission and additional interim summary    Adam [name] is a 70 y.o. male with medical history significant of dysarthria due to underlying Wernicke's encephalopathy from ongoing heavy alcohol abuse, CAD status post CABG and stenting, persistent A. fib on Eliquis, COPD, hypertension, hyperlipidemia, insulin-dependent type 2 diabetes, alcohol abuse, OSA does not use CPAP, obesity (BMI 33.60), mood disorder, GERD, gout.  He was sent to the ED yesterday from A. fib clinic as labs revealed hypokalemia with potassium 2.6.  EKG done at A. fib clinic showing sinus rhythm and worsening QT prolongation (QTC 592).  Labs done in the ED revealed potassium 2.4, he is post 4 Covid shots, kept in the Hospital for severe Hypokalemia.                                                                 Hospital Course    Severe hypokalemia causing prolonged QTC (chronically around 500) - Likely due to poor oral intake and ongoing alcohol abuse, no emesis - diarrhea or diuretics, stable post aggressive replacement, Mag Ok, no PPI, QTc improved, he is now completely symptom-free  potassium is stable QTC serially come down and currently around 500-520, it was up to close to 600 upon admission, will discharge him, request PCP to closely monitor QTC and potassium levels.   2. Incidental Covid 19 infection - stable, fully vaccinated, monitor.   3.  Chronic Wernicke's encephalopathy with dysarthria -has outpatient neurology follow-up, no acute issue, continue replacement of thiamine and folic acid counseled to quit alcohol.   4.  Severe ongoing alcohol abuse.  Discussed with patient wife, counseled to quit, CIWA protocol and Librium. No DTs.   5.  Persistent A. fib.  Mali vas 2 score of greater than 4.  Continue beta-blocker amiodarone and Eliquis.   6.  Hypertension.  Currently on beta-blocker monitor and adjust.   7.  Dyslipidemia.  On statin.   8.  History of gout.  On allopurinol.   9.  COPD.  Stable no acute issues supportive care.   10.  DM type II.  Home Rx.  CBG (last 3)  Recent Labs    08/01/21 1655 08/01/21 2128 08/02/21 0841  GLUCAP 171* 102* 136*     Discharge diagnosis     Principal Problem:   Hypokalemia Active Problems:   CAD (coronary artery disease)   Hypertension associated with diabetes (HCC)   GERD (gastroesophageal reflux disease)   Gout    Discharge instructions    Discharge Instructions     Diet - low sodium heart healthy   Complete by: As directed    Discharge instructions   Complete by: As directed    Follow with Primary MD [name], MD in 3 days   Get CBC, CMP, Magnesium, TSH, 2 view Chest X ray -  checked next visit within 3 days by Primary MD    Activity: As tolerated with Full fall precautions use walker/cane & assistance as needed  Disposition Home    Diet: Heart Healthy Low Carb  Special Instructions: If you have smoked or chewed Tobacco  in the last 2 yrs please stop smoking, stop any regular Alcohol  and or any Recreational drug use.  On your next visit with your primary care physician please  Get Medicines reviewed and adjusted.  Please request your Prim.MD to go over all Hospital Tests and Procedure/Radiological results at the follow up, please get all Hospital records sent to your Prim MD by signing hospital release before you go home.  If you experience worsening of your admission symptoms, develop shortness of breath, life threatening emergency, suicidal or homicidal thoughts you must seek medical attention immediately by calling 911 or calling your MD immediately  if symptoms less severe.  You Must read complete instructions/literature along with all the possible adverse reactions/side effects for all the Medicines you take and that have been prescribed to you. Take any new Medicines after you have completely understood and accpet all the possible adverse reactions/side effects.   Increase activity slowly   Complete by: As directed        Discharge Medications   Allergies as of 08/02/2021       Reactions   Xarelto [rivaroxaban] Other (See Comments)   Patient stated he "ended up in the hospital with a rectal bleed"   Amoxicillin Rash, Other (See Comments)   RASH IN GROIN AREA Has patient had a PCN reaction causing immediate rash, facial/tongue/throat swelling, SOB or lightheadedness with hypotension: no Has patient had a PCN reaction causing severe rash involving mucus membranes or skin necrosis: no Has patient had a PCN reaction that required hospitalization: no Has patient had a PCN reaction occurring within the last 10 years: yes If all of the above answers are "NO", then may proceed with Cephalosporin use.   Augmentin [amoxicillin-pot Clavulanate] Rash, Other (See Comments)   RASH IN GROIN AREA   Azithromycin Rash, Other (See Comments)   RASH IN GROIN AREA   Clindamycin/lincomycin Rash   Keflex [cephalexin] Rash        Medication List     TAKE these medications    acetaminophen 325 MG tablet Commonly known as: TYLENOL Take 325 mg by mouth every 8  (eight) hours as needed for moderate pain.   allopurinol 300 MG tablet Commonly known as: ZYLOPRIM TAKE 1 TABLET(300 MG) BY MOUTH DAILY What changed: See the new instructions.   amiodarone 200 MG tablet Commonly known as: PACERONE Take 1 tablet (200 mg total) by mouth daily.   B COMPLEX-C-FOLIC ACID PO Take 1 tablet by mouth daily.  divalproex 125 MG capsule Commonly known as: DEPAKOTE SPRINKLE Take 125 mg by mouth 3 (three) times daily.   DULoxetine 60 MG capsule Commonly known as: CYMBALTA TAKE 1 CAPSULE(60 MG) BY MOUTH DAILY What changed: See the new instructions.   Eliquis 5 MG Tabs tablet Generic drug: apixaban TAKE 1 TABLET(5 MG) BY MOUTH TWICE DAILY What changed: See the new instructions.   gabapentin 300 MG capsule Commonly known as: NEURONTIN TAKE 2 CAPSULES BY MOUTH AT NIGHT AS NEEDED FOR RESTLESS LEGS OR SYMPTOMS What changed:  how much to take how to take this when to take this reasons to take this additional instructions   HM LIDOCAINE PATCH EX Apply 1 patch topically as needed (pain).   Januvia 100 MG tablet Generic drug: sitaGLIPtin TAKE 1 TABLET(100 MG) BY MOUTH DAILY What changed: See the new instructions.   Jardiance 25 MG Tabs tablet Generic drug: empagliflozin TAKE 1 TABLET(25 MG) BY MOUTH DAILY BEFORE AND BREAKFAST What changed: See the new instructions.   Magnesium Oxide 400 MG Caps Take 1 capsule (400 mg total) by mouth daily.   metFORMIN 500 MG 24 hr tablet Commonly known as: GLUCOPHAGE-XR Take 1 tablet (500 mg total) by mouth in the morning and at bedtime.   metoprolol tartrate 50 MG tablet Commonly known as: LOPRESSOR Take 1 tablet (50 mg total) by mouth 2 (two) times daily.   montelukast 10 MG tablet Commonly known as: SINGULAIR TAKE 1 TABLET BY MOUTH AT BEDTIME   multivitamin with minerals Tabs tablet Take 1 tablet by mouth daily.   Novofine Pen Needle 32G X 6 MM Misc Generic drug: Insulin Pen Needle Use 1-4 times  daily as needed for insulin   onetouch ultrasoft lancets Use 1-4 times daily as needed/directed  DX E11.9   OneTouch Verio test strip Generic drug: glucose blood Use 1-4 times daily as directed/needed   DX E11.9   OneTouch Verio w/Device Kit Use 1-4 times daily as needed/directed DX E11.9   rosuvastatin 40 MG tablet Commonly known as: CRESTOR Take 40 mg by mouth daily.   thiamine 100 MG tablet Commonly known as: Vitamin B-1 Take 500 mg by mouth daily.   Toujeo SoloStar 300 UNIT/ML Solostar Pen Generic drug: insulin glargine (1 Unit Dial) Inject 80 Units into the skin at bedtime.   Trelegy Ellipta 100-62.5-25 MCG/ACT Aepb Generic drug: Fluticasone-Umeclidin-Vilant Inhale 1 puff into the lungs daily.         Follow-up Information     [name], Alinda Sierras, MD. Schedule an appointment as soon as possible for a visit in 3 day(s).   Specialty: Family Medicine Contact information: [place] [address] [phone]         [name], MD Follow up in 1 week(s).   Specialty: Cardiology Contact information: [place] [address] [phone]         Sueanne Margarita, MD .   Specialty: Cardiology Contact information: [phone] [place] [address] [phone]         [name], MD .   Specialty: Cardiology Contact information: [address] [place] [address] [phone]                 Major procedures and Radiology Reports - PLEASE review detailed and final reports thoroughly  -        DG CHEST PORT 1 VIEW  Result Date: 07/31/2021 CLINICAL DATA:  Shortness of breath,  COVID EXAM: PORTABLE CHEST 1 VIEW COMPARISON:  Radiograph 04/05/2021 FINDINGS: Unchanged cardiomediastinal silhouette with postsurgical changes. Unchanged fractured upper median sternotomy wires. Loop recorder overlies the left chest. Coronary artery calcifications. Slight  improvement in interstitial edema. No new airspace disease. No large pleural effusion or visible pneumothorax. No acute osseous abnormality. IMPRESSION: Slight improvement in interstitial edema.  No new airspace disease. Electronically Signed   By: Maurine Simmering M.D.   On: 07/31/2021 08:01   CUP PACEART REMOTE DEVICE CHECK  Result Date: 07/15/2021 ILR summary report received. Battery status OK. Normal device function. No new symptom, brady, or pause episodes. No new AF episodes. Monthly summary reports and ROV/PRN 1 new tachy event, duration 35mn 12 sec, rate 188.  Likely AF with RVR Burden 1.2% Amiodarone, Metoprolol, Eliquis prescribed. LR    Today   Subjective    RAdonijah Baenatoday has no headache,no chest abdominal pain,no new weakness tingling or numbness, feels much better wants to go home today.     Objective   Blood pressure 150/80, pulse 84, temperature 98 F (36.7 C), temperature source Oral, resp. rate 18, height '5\' 10"'  (1.778 m), weight 106.2 kg, SpO2 99 %.  No intake or output data in the 24 hours ending 08/02/21 0925  Exam  Awake Alert, No new F.N deficits, Normal affect [place].AT,PERRAL Supple Neck,No JVD, No cervical lymphadenopathy appriciated.  Symmetrical Chest wall movement, Good air movement bilaterally, CTAB RRR,No Gallops,Rubs or new Murmurs, No Parasternal Heave +ve B.Sounds, Abd Soft, Non tender, No organomegaly appriciated, No rebound -guarding or rigidity. No Cyanosis, Clubbing or edema, No new Rash or bruise   Data Review   CBC w Diff:  Lab Results  Component Value Date   WBC 5.7 08/01/2021   HGB 11.9 (L) 08/01/2021   HGB 10.5 (L) 06/05/2020   HCT 34.4 (L) 08/01/2021   HCT 32.0 (L) 06/05/2020   PLT 187 08/01/2021   PLT 203 06/05/2020   LYMPHOPCT 22 07/30/2021   MONOPCT 16 07/30/2021   EOSPCT 2 07/30/2021   BASOPCT 1 07/30/2021    CMP:  Lab Results  Component Value Date   NA 139 08/02/2021   NA 143 12/08/2020   K 3.5 08/02/2021   CL 108  08/02/2021   CO2 24 08/02/2021   BUN <5 (L) 08/02/2021   BUN 12 12/08/2020   CREATININE 0.91 08/02/2021   CREATININE 0.93 09/23/2020   PROT 6.9 07/30/2021   ALBUMIN 3.9 07/30/2021   BILITOT 0.9 07/30/2021   ALKPHOS 48 07/30/2021   AST 28 07/30/2021   ALT 16 07/30/2021  .   Total Time in preparing paper work, data evaluation and todays exam - 317minutes  PLala LundM.D on 08/02/2021 at 9:25 AM  [facility]

## 2021-08-02 NOTE — Plan of Care (Signed)

## 2021-08-02 NOTE — Discharge Instructions (Addendum)
Follow with Primary MD [name], MD in 3 days   Get CBC, CMP, Magnesium, TSH, 2 view Chest X ray -  checked next visit within 3 days by Primary MD    Activity: As tolerated with Full fall precautions use walker/cane & assistance as needed  Disposition Home    Diet: Heart Healthy Low Carb  Special Instructions: If you have smoked or chewed Tobacco  in the last 2 yrs please stop smoking, stop any regular Alcohol  and or any Recreational drug use.  On your next visit with your primary care physician please Get Medicines reviewed and adjusted.  Please request your Prim.MD to go over all Hospital Tests and Procedure/Radiological results at the follow up, please get all Hospital records sent to your Prim MD by signing hospital release before you go home.  If you experience worsening of your admission symptoms, develop shortness of breath, life threatening emergency, suicidal or homicidal thoughts you must seek medical attention immediately by calling 911 or calling your MD immediately  if symptoms less severe.  You Must read complete instructions/literature along with all the possible adverse reactions/side effects for all the Medicines you take and that have been prescribed to you. Take any new Medicines after you have completely understood and accpet all the possible adverse reactions/side effects.      Person Under Monitoring Name: Adam [name]  Location: [place] [address]   Infection Prevention Recommendations for Individuals Confirmed to have, or Being Evaluated for, 2019 Novel Coronavirus (COVID-19) Infection Who Receive Care at Home  Individuals who are confirmed to have, or are being evaluated for, COVID-19 should follow the prevention steps below until a healthcare provider or local or state health department says they can return to normal activities.  Stay home except to get medical care You should restrict activities outside your home,  except for getting medical care. Do not go to work, school, or public areas, and do not use public transportation or taxis.  Call ahead before visiting your doctor Before your medical appointment, call the healthcare provider and tell them that you have, or are being evaluated for, COVID-19 infection. This will help the healthcare provider's office take steps to keep other people from getting infected. Ask your healthcare provider to call the local or state health department.  Monitor your symptoms Seek prompt medical attention if your illness is worsening (e.g., difficulty breathing). Before going to your medical appointment, call the healthcare provider and tell them that you have, or are being evaluated for, COVID-19 infection. Ask your healthcare provider to call the local or state health department.  Wear a facemask You should wear a facemask that covers your nose and mouth when you are in the same room with other people and when you visit a healthcare provider. People who live with or visit you should also wear a facemask while they are in the same room with you.  Separate yourself from other people in your home As much as possible, you should stay in a different room from other people in your home. Also, you should use a separate bathroom, if available.  Avoid sharing household items You should not share dishes, drinking glasses, cups, eating utensils, towels, bedding, or other items with other people in your home. After using these items, you should wash them thoroughly with soap and water.  Cover your coughs and sneezes Cover your mouth and nose with a tissue when you cough or sneeze, or you can  cough or sneeze into your sleeve. Throw used tissues in a lined trash can, and immediately wash your hands with soap and water for at least 20 seconds or use an alcohol-based hand rub.  Wash your [facility] your hands often and thoroughly with soap and water for at least 20 seconds.  You can use an alcohol-based hand sanitizer if soap and water are not available and if your hands are not visibly dirty. Avoid touching your eyes, nose, and mouth with unwashed hands.   Prevention Steps for Caregivers and Household Members of Individuals Confirmed to have, or Being Evaluated for, COVID-19 Infection Being Cared for in the Home  If you live with, or provide care at home for, a person confirmed to have, or being evaluated for, COVID-19 infection please follow these guidelines to prevent infection:  Follow healthcare provider's instructions Make sure that you understand and can help the patient follow any healthcare provider instructions for all care.  Provide for the patient's basic needs You should help the patient with basic needs in the home and provide support for getting groceries, prescriptions, and other personal needs.  Monitor the patient's symptoms If they are getting sicker, call his or her medical provider and tell them that the patient has, or is being evaluated for, COVID-19 infection. This will help the healthcare provider's office take steps to keep other people from getting infected. Ask the healthcare provider to call the local or state health department.  Limit the number of people who have contact with the patient If possible, have only one caregiver for the patient. Other household members should stay in another home or place of residence. If this is not possible, they should stay in another room, or be separated from the patient as much as possible. Use a separate bathroom, if available. Restrict visitors who do not have an essential need to be in the home.  Keep older adults, very young children, and other sick people away from the patient Keep older adults, very young children, and those who have compromised immune systems or chronic health conditions away from the patient. This includes people with chronic heart, lung, or kidney conditions,  diabetes, and cancer.  Ensure good ventilation Make sure that shared spaces in the home have good air flow, such as from an air conditioner or an opened window, weather permitting.  Wash your hands often Wash your hands often and thoroughly with soap and water for at least 20 seconds. You can use an alcohol based hand sanitizer if soap and water are not available and if your hands are not visibly dirty. Avoid touching your eyes, nose, and mouth with unwashed hands. Use disposable paper towels to dry your hands. If not available, use dedicated cloth towels and replace them when they become wet.  Wear a facemask and gloves Wear a disposable facemask at all times in the room and gloves when you touch or have contact with the patient's blood, body fluids, and/or secretions or excretions, such as sweat, saliva, sputum, nasal mucus, vomit, urine, or feces.  Ensure the mask fits over your nose and mouth tightly, and do not touch it during use. Throw out disposable facemasks and gloves after using them. Do not reuse. Wash your hands immediately after removing your facemask and gloves. If your personal clothing becomes contaminated, carefully remove clothing and launder. Wash your hands after handling contaminated clothing. Place all used disposable facemasks, gloves, and other waste in a lined container before disposing them with other household  waste. Remove gloves and wash your hands immediately after handling these items.  Do not share dishes, glasses, or other household items with the patient Avoid sharing household items. You should not share dishes, drinking glasses, cups, eating utensils, towels, bedding, or other items with a patient who is confirmed to have, or being evaluated for, COVID-19 infection. After the person uses these items, you should wash them thoroughly with soap and water.  Wash laundry thoroughly Immediately remove and wash clothes or bedding that have blood, body fluids,  and/or secretions or excretions, such as sweat, saliva, sputum, nasal mucus, vomit, urine, or feces, on them. Wear gloves when handling laundry from the patient. Read and follow directions on labels of laundry or clothing items and detergent. In general, wash and dry with the warmest temperatures recommended on the label.  Clean all areas the individual has used often Clean all touchable surfaces, such as counters, tabletops, doorknobs, bathroom fixtures, toilets, phones, keyboards, tablets, and bedside tables, every day. Also, clean any surfaces that may have blood, body fluids, and/or secretions or excretions on them. Wear gloves when cleaning surfaces the patient has come in contact with. Use a diluted bleach solution (e.g., dilute bleach with 1 part bleach and 10 parts water) or a household disinfectant with a label that says EPA-registered for coronaviruses. To make a bleach solution at home, add 1 tablespoon of bleach to 1 quart (4 cups) of water. For a larger supply, add  cup of bleach to 1 gallon (16 cups) of water. Read labels of cleaning products and follow recommendations provided on product labels. Labels contain instructions for safe and effective use of the cleaning product including precautions you should take when applying the product, such as wearing gloves or eye protection and making sure you have good ventilation during use of the product. Remove gloves and wash hands immediately after cleaning.  Monitor yourself for signs and symptoms of illness Caregivers and household members are considered close contacts, should monitor their health, and will be asked to limit movement outside of the home to the extent possible. Follow the monitoring steps for close contacts listed on the symptom monitoring form.   ? If you have additional questions, contact your local health department or call the epidemiologist on call at [phone] (available 24/7). ? This guidance is subject to  change. For the most up-to-date guidance from [facility], please refer to their website: [email or web address]

## 2021-08-04 ENCOUNTER — Telehealth: Payer: Self-pay

## 2021-08-04 NOTE — Progress Notes (Deleted)
[facility]  PATIENT: Adam [name] DOB: 19-Jun-1951  REFERRING CLINICIAN: [name], MD HISTORY FROM: *** REASON FOR VISIT: dysarthria   HISTORICAL  CHIEF COMPLAINT:  No chief complaint on file.   HISTORY OF PRESENT ILLNESS:  The patient presents for evaluation of dysarthria***  Memory*** Parkonsonism*** Dysautonomia*** Fatigable weakness***  He does have a history of alcohol abuse. Currently***. 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 MRI also showed ventriculomegaly suspected to be secondary to global atrophy.  OTHER MEDICAL CONDITIONS: alcohol abuse, CAD s/p CABG, HTN, HLD, DM, afib on Eliquis, COPD, asthma, OSA   REVIEW OF SYSTEMS: Full 14 system review of systems performed and negative with exception of: ***  ALLERGIES: Allergies  Allergen Reactions   Xarelto [Rivaroxaban] Other (See Comments)    Patient stated he "ended up in the hospital with a rectal bleed"   Amoxicillin Rash and Other (See Comments)    RASH IN GROIN AREA Has patient had a PCN reaction causing immediate rash, facial/tongue/throat swelling, SOB or lightheadedness with hypotension: no Has patient had a PCN reaction causing severe rash involving mucus membranes or skin necrosis: no Has patient had a PCN reaction that required hospitalization: no Has patient had a PCN reaction occurring within the last 10 years: yes If all of the above answers are "NO", then may proceed with Cephalosporin use.    Augmentin [Amoxicillin-Pot Clavulanate] Rash and Other (See Comments)    RASH IN GROIN AREA   Azithromycin Rash and Other (See Comments)    RASH IN GROIN AREA   Clindamycin/Lincomycin Rash   Keflex [Cephalexin] Rash    HOME MEDICATIONS: Outpatient Medications Prior to Visit  Medication Sig Dispense Refill   acetaminophen (TYLENOL) 325 MG tablet Take 325 mg by mouth every 8 (eight) hours as needed  for moderate pain.     allopurinol (ZYLOPRIM) 300 MG tablet TAKE 1 TABLET(300 MG) BY MOUTH DAILY (Patient taking differently: Take 300 mg by mouth daily.) 90 tablet 3   amiodarone (PACERONE) 200 MG tablet Take 1 tablet (200 mg total) by mouth daily. 90 tablet 1   B COMPLEX-C-FOLIC ACID PO Take 1 tablet by mouth daily.     Blood Glucose Monitoring Suppl (ONETOUCH VERIO) w/Device KIT Use 1-4 times daily as needed/directed DX E11.9 1 kit 0   divalproex (DEPAKOTE SPRINKLE) 125 MG capsule Take 125 mg by mouth 3 (three) times daily.     DULoxetine (CYMBALTA) 60 MG capsule TAKE 1 CAPSULE(60 MG) BY MOUTH DAILY (Patient taking differently: Take 60 mg by mouth daily.) 90 capsule 1   ELIQUIS 5 MG TABS tablet TAKE 1 TABLET(5 MG) BY MOUTH TWICE DAILY (Patient taking differently: Take 5 mg by mouth 2 (two) times daily.) 180 tablet 1   Fluticasone-Umeclidin-Vilant (TRELEGY ELLIPTA) 100-62.5-25 MCG/INH AEPB Inhale 1 puff into the lungs daily. 60 each 3   gabapentin (NEURONTIN) 300 MG capsule TAKE 2 CAPSULES BY MOUTH AT NIGHT AS NEEDED FOR RESTLESS LEGS OR SYMPTOMS (Patient taking differently: Take 600 mg by mouth at bedtime as needed (restless legs or symptoms).) 180 capsule 3   HM LIDOCAINE PATCH EX Apply 1 patch topically as needed (pain).     Insulin Pen Needle (NOVOFINE PEN NEEDLE) 32G X 6 MM MISC Use 1-4 times daily as needed for insulin 100 each 3   JANUVIA 100 MG tablet TAKE 1 TABLET(100 MG) BY MOUTH DAILY (Patient taking differently: Take 100 mg by mouth daily.) 90 tablet  1   JARDIANCE 25 MG TABS tablet TAKE 1 TABLET(25 MG) BY MOUTH DAILY BEFORE AND BREAKFAST (Patient taking differently: Take 25 mg by mouth daily.) 90 tablet 1   Lancets (ONETOUCH ULTRASOFT) lancets Use 1-4 times daily as needed/directed  DX E11.9 200 each 12   Magnesium Oxide 400 MG CAPS Take 1 capsule (400 mg total) by mouth daily. (Patient not taking: No sig reported) 90 capsule 1   metFORMIN (GLUCOPHAGE-XR) 500 MG 24 hr tablet Take 1  tablet (500 mg total) by mouth in the morning and at bedtime. 180 tablet 3   metoprolol tartrate (LOPRESSOR) 50 MG tablet Take 1 tablet (50 mg total) by mouth 2 (two) times daily.     montelukast (SINGULAIR) 10 MG tablet TAKE 1 TABLET BY MOUTH AT BEDTIME (Patient taking differently: Take 10 mg by mouth at bedtime.) 90 tablet 3   Multiple Vitamin (MULTIVITAMIN WITH MINERALS) TABS tablet Take 1 tablet by mouth daily.     ONETOUCH VERIO test strip Use 1-4 times daily as directed/needed   DX E11.9 200 each 12   rosuvastatin (CRESTOR) 40 MG tablet Take 40 mg by mouth daily.     thiamine (VITAMIN B-1) 100 MG tablet Take 500 mg by mouth daily.     TOUJEO SOLOSTAR 300 UNIT/ML Solostar Pen Inject 80 Units into the skin at bedtime.     No facility-administered medications prior to visit.    PAST MEDICAL HISTORY: Past Medical History:  Diagnosis Date   Alcohol withdrawal ([place]) 10/24/2019   Allergy    Anxiety    history of PTSD following CABG   Ascending aortic aneurysm 01/31/2018   43 x 42 mm, pt unaware   Asthma    Cardiomegaly 10/17/2017   Colitis- colonoscopy 2014 07/13/2015   COPD (chronic obstructive pulmonary disease) ([place])    Coronary artery disease    x 6   Depression    Diabetes mellitus without complication ([place])    Family history of polyps in the colon    Finger dislocation    Left pinkie   GERD (gastroesophageal reflux disease)    Gout    H/O atrial fibrillation without current medication    following CABG with no documented episodes since then.   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    PAST SURGICAL HISTORY: Past Surgical History:  Procedure Laterality Date   ANKLE FRACTURE  SURGERY Right 1991   APPENDECTOMY     ATRIAL FIBRILLATION ABLATION N/A 03/31/2018   Procedure: ATRIAL FIBRILLATION ABLATION;  Surgeon: [name], MD;  Location: [place] [facility];  Service: Cardiovascular;  Laterality: N/A;   CARDIAC ELECTROPHYSIOLOGY MAPPING AND ABLATION     CARDIOVERSION N/A 03/23/2021   Procedure: CARDIOVERSION;  Surgeon: Acie Fredrickson Wonda Cheng, MD;  Location: [place];  Service: Cardiovascular;  Laterality: N/A;   COLONOSCOPY  06/11/2021   COLONOSCOPY W/ BIOPSIES  2017   x7   CORONARY ANGIOPLASTY WITH STENT PLACEMENT     CORONARY ARTERY BYPASS GRAFT     FINGER SURGERY  04/2018   Small finger left hand   implantable loop recorder placement  07/02/2019   Medtronic Reveal Linq model  [id number] Bethann Goo [id number]) implanted in office by Dr Rayann Heman   TEE WITHOUT CARDIOVERSION N/A 03/30/2018   Procedure: TRANSESOPHAGEAL ECHOCARDIOGRAM (TEE);  Surgeon: [name], MD;  Location: [place];  Service: Cardiovascular;  Laterality: N/A;   TEE WITHOUT CARDIOVERSION N/A 03/23/2021   Procedure: TRANSESOPHAGEAL ECHOCARDIOGRAM (TEE);  Surgeon: Acie Fredrickson Wonda Cheng, MD;  [facility];  Service: Cardiovascular;  Laterality: N/A;   TOTAL HIP ARTHROPLASTY Left    TOTAL HIP ARTHROPLASTY Right 09/12/2018   Procedure: TOTAL HIP ARTHROPLASTY ANTERIOR APPROACH;  Surgeon: [name], MD;  Location: [facility];  Service: Orthopedics;  Laterality: Right;  70 mins    FAMILY HISTORY: Family History  Problem Relation Age of Onset   Colon cancer Mother    Cancer Father        UNKNOWN TYPE   Heart disease Paternal Grandmother    Heart disease Paternal Grandfather    Esophageal cancer Neg Hx    Rectal cancer Neg Hx    Stomach cancer Neg Hx     SOCIAL HISTORY: Social History   Socioeconomic History   Marital status: Married    Spouse name: Not on file   Number of children: 0   Years of education: Not on file   Highest education level: Not on file  Occupational History    Occupation: retired  Tobacco Use   Smoking status: Former    Packs/day: 1.00    Years: 7.00    Pack years: 7.00    Types: Cigarettes    Quit date: 10/04/1977    Years since quitting: 43.8   Smokeless tobacco: Never   Tobacco comments:    never smoked over 1 pack   Vaping Use   Vaping Use: Never used  Substance and Sexual Activity   Alcohol use: Yes    Alcohol/week: 3.0 - 4.0 standard drinks    Types: 1 Glasses of wine, 2 - 3 Shots of liquor per week    Comment: DAILY   Drug use: No    Comment: Smoked Marijuana back in 1970s   Sexual activity: Yes  Other Topics Concern   Not on file  Social History Narrative   Married, no children   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 - returned to [employer] from [place] 2014   Enjoys bird watching, reading historical books about WWII, watching tv   Social Determinants of Radio broadcast assistant Strain: Low Risk    Difficulty of Paying Living Expenses: Not hard at [employer] Insecurity: Not on file  Transportation Needs: No Transportation Needs   Lack of Transportation (Medical): No   Lack of Transportation (Non-Medical): No  Physical Activity: Inactive   Days of Exercise per Week: 0 days   Minutes of Exercise per Session: 0 min  Stress: No Stress Concern Present   Feeling of Stress : Not at all  Social Connections: Moderately Isolated   Frequency of Communication with Friends and Family: More than three times a week   Frequency of Social Gatherings with Friends and Family: Never   Attends Religious Services: Never   Marine scientist or Organizations: No   Attends Music therapist: Never   Marital Status: Married  Human resources officer Violence: Not At Risk   Fear of Current or Ex-Partner: No   Emotionally Abused: No   Physically Abused: No   Sexually Abused: No     PHYSICAL EXAM ***  GENERAL EXAM/CONSTITUTIONAL: Vitals: There were no  vitals filed for this visit. There is no  height or weight on file to calculate BMI. Wt Readings from Last 3 Encounters:  07/30/21 234 lb 3.2 oz (106.2 kg)  07/30/21 234 lb 3.2 oz (106.2 kg)  06/24/21 239 lb 14.4 oz (108.8 kg)   Patient is in no distress; well developed, nourished and groomed; neck is supple  CARDIOVASCULAR: Examination of carotid arteries is normal; no carotid bruits Regular rate and rhythm, no murmurs Examination of peripheral vascular system by observation and palpation is normal  EYES: Pupils round and reactive to light, Visual fields full to confrontation, Extraocular movements intacts,   MUSCULOSKELETAL: Gait, strength, tone, movements noted in Neurologic exam below  NEUROLOGIC: MENTAL STATUS:  MMSE - Mini Mental State Exam 06/26/2018 06/08/2017  Not completed: (No Data) (No Data)   awake, alert, oriented to person, place and time recent and remote memory intact normal attention and concentration language fluent, comprehension intact, naming intact fund of knowledge appropriate  CRANIAL NERVE:  2nd - no papilledema or hemorrhages on fundoscopic exam 2nd, 3rd, 4th, 6th - pupils equal and reactive to light, visual fields full to confrontation, extraocular muscles intact, no nystagmus 5th - facial sensation symmetric 7th - facial strength symmetric 8th - hearing intact 9th - palate elevates symmetrically, uvula midline 11th - shoulder shrug symmetric 12th - tongue protrusion midline  MOTOR:  normal bulk and tone, full strength in the BUE, BLE  SENSORY:  normal and symmetric to light touch, pinprick, temperature, vibration  COORDINATION:  finger-nose-finger, fine finger movements normal  REFLEXES:  deep tendon reflexes present and symmetric  GAIT/STATION:  normal     DIAGNOSTIC DATA (LABS, IMAGING, TESTING) - I reviewed patient records, labs, notes, testing and imaging myself where available.  Lab Results  Component Value Date   WBC 5.7 08/01/2021   HGB 11.9 (L) 08/01/2021    HCT 34.4 (L) 08/01/2021   MCV 96.1 08/01/2021   PLT 187 08/01/2021      Component Value Date/Time   NA 139 08/02/2021 0229   NA 143 12/08/2020 1459   K 3.5 08/02/2021 0229   CL 108 08/02/2021 0229   CO2 24 08/02/2021 0229   GLUCOSE 123 (H) 08/02/2021 0229   BUN <5 (L) 08/02/2021 0229   BUN 12 12/08/2020 1459   CREATININE 0.91 08/02/2021 0229   CREATININE 0.93 09/23/2020 1216   CALCIUM 9.0 08/02/2021 0229   PROT 6.9 07/30/2021 1642   ALBUMIN 3.9 07/30/2021 1642   AST 28 07/30/2021 1642   ALT 16 07/30/2021 1642   ALKPHOS 48 07/30/2021 1642   BILITOT 0.9 07/30/2021 1642   GFRNONAA >60 08/02/2021 0229   GFRAA 115 08/22/2020 0921   Lab Results  Component Value Date   CHOL 137 09/23/2020   HDL 40 09/23/2020   LDLCALC 64 09/23/2020   LDLDIRECT 124.0 06/23/2018   TRIG 280 (H) 09/23/2020   CHOLHDL 3.4 09/23/2020   Lab Results  Component Value Date   HGBA1C 6.4 (A) 06/24/2021   Lab Results  Component Value Date   VITAMINB12 353 02/09/2020   Lab Results  Component Value Date   TSH 1.984 07/30/2021    ***    ASSESSMENT AND PLAN  70 y.o. year old male with ***   No diagnosis found.    PLAN: Speech therapy***  No orders of the defined types were placed in this encounter.   No orders of the defined types were placed in this encounter.   No follow-ups on file.  Genia Harold, MD  I spent an average of *** chart reviewing and counseling the patient, with at least 50% of the time face to face with the patient.   [facility] [address], [place],  [address] [phone]

## 2021-08-04 NOTE — Telephone Encounter (Signed)
Transition Care Management Follow-up Telephone Call Date of discharge and from where: 08/02/21 from [facility] How have you been since you were released from the hospital? Pt states he was diagnosed with covid 07/30/21; runny nose, cough Any questions or concerns? Yes  Items Reviewed: Did the pt receive and understand the discharge instructions provided? Yes  Medications obtained and verified?  Pt states he was not prescribed any medications Any new allergies since your discharge? No  Dietary orders reviewed? Yes Do you have support at home? Yes   Home Care and Equipment/Supplies: Were home health services ordered? not applicable  Functional Questionnaire: (I = Independent and D = Dependent) ADLs: I  Bathing/Dressing- I  Meal Prep- I  Eating- I  Maintaining continence- I  Transferring/Ambulation- I  Managing Meds- I  Follow up appointments reviewed:  PCP Hospital f/u appt confirmed? Yes  Scheduled to see Dr Elease Hashimoto on 08/05/21 @ 3p. [facility] f/u appt confirmed? Yes  Scheduled to see Neurology on 08/05/21 @ 1030. Are transportation arrangements needed? Yes  If their condition worsens, is the pt aware to call PCP or go to the Emergency Dept.? Yes Was the patient provided with contact information for the PCP's office or ED? Yes Was to pt encouraged to call back with questions or concerns? Yes

## 2021-08-04 NOTE — Telephone Encounter (Signed)
Transition Care Management Unsuccessful Follow-up Telephone Call  Date of discharge and from where:  08/02/21 from [facility]  Attempts:  1st Attempt  Reason for unsuccessful TCM follow-up call:  Left voice message

## 2021-08-05 ENCOUNTER — Ambulatory Visit (INDEPENDENT_AMBULATORY_CARE_PROVIDER_SITE_OTHER): Payer: Medicare Other

## 2021-08-05 ENCOUNTER — Ambulatory Visit: Payer: Medicare Other | Admitting: Psychiatry

## 2021-08-05 ENCOUNTER — Other Ambulatory Visit: Payer: Self-pay

## 2021-08-05 ENCOUNTER — Ambulatory Visit (INDEPENDENT_AMBULATORY_CARE_PROVIDER_SITE_OTHER): Payer: Medicare Other | Admitting: Family Medicine

## 2021-08-05 ENCOUNTER — Telehealth: Payer: Self-pay | Admitting: Psychiatry

## 2021-08-05 VITALS — BP 142/84 | HR 87 | Temp 97.7°F | Wt 238.6 lb

## 2021-08-05 DIAGNOSIS — R059 Cough, unspecified: Secondary | ICD-10-CM

## 2021-08-05 DIAGNOSIS — E876 Hypokalemia: Secondary | ICD-10-CM | POA: Diagnosis not present

## 2021-08-05 DIAGNOSIS — R9431 Abnormal electrocardiogram [ECG] [EKG]: Secondary | ICD-10-CM

## 2021-08-05 NOTE — Telephone Encounter (Signed)
Pt's wife cancelled appt due to GPS got them lost.

## 2021-08-05 NOTE — Progress Notes (Signed)
Established Patient Office Visit  Subjective:  Patient ID: Adam [name], male    DOB: July 11, 1951  Age: 70 y.o. MRN: [id number]  CC:  Chief Complaint  Patient presents with   Hospitalization Follow-up    HPI Adam [name] presents for hospital follow-up.  He was admitted on the 27th and discharged on the 30th.  Longstanding history of alcohol abuse.  He has history of Wernicke's encephalopathy, CAD, atrial fibrillation on Eliquis, COPD, hypertension, hyperlipidemia, type 2 diabetes on insulin.  Ongoing alcohol use.  Had been seen recently in the atrial fibrillation clinic and had hypokalemia with potassium 2.6.  EKG showed worsening QT prolongation.  He was sent to the ED and repeat potassium 2.4.  Admitted for severe hypokalemia.  Patient has history of chronically prolonged QT around 500 but recently this was up to 592.  He denied any recent diarrhea or emesis.  No diuretic use.  No PPI use.  He did incidentally have COVID-19 positive test but was basically asymptomatic.  He does have some cough at this time.  Had been on potassium supplement previously but apparently this was discontinued after hospitalization back in June.  He relates some cough today but no fever.  Recommendation was to get follow-up chest x-ray from his recent admission as well as to recheck potassium and reassess QT interval.  Past Medical History:  Diagnosis Date   Alcohol withdrawal ([place]) 10/24/2019   Allergy    Anxiety    history of PTSD following CABG   Ascending aortic aneurysm 01/31/2018   43 x 42 mm, pt unaware   Asthma    Cardiomegaly 10/17/2017   Colitis- colonoscopy 2014 07/13/2015   COPD (chronic obstructive pulmonary disease) ([place])    Coronary artery disease    x 6   Depression    Diabetes mellitus without complication ([place])    Family history of polyps in the colon    Finger dislocation    Left pinkie   GERD (gastroesophageal reflux disease)    Gout    H/O atrial fibrillation without  current medication    following CABG with no documented episodes since then.   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    Past Surgical History:  Procedure Laterality Date   ANKLE FRACTURE SURGERY Right 1991   APPENDECTOMY     ATRIAL FIBRILLATION ABLATION N/A 03/31/2018   Procedure: ATRIAL FIBRILLATION ABLATION;  Surgeon: [name], MD;  Location: [facility];  Service: Cardiovascular;  Laterality: N/A;   CARDIAC ELECTROPHYSIOLOGY MAPPING AND ABLATION     CARDIOVERSION N/A 03/23/2021   Procedure: CARDIOVERSION;  Surgeon: Acie Fredrickson Wonda Cheng, MD;  Location: [place];  Service: Cardiovascular;  Laterality: N/A;   COLONOSCOPY  06/11/2021   COLONOSCOPY W/ BIOPSIES  2017   x7   CORONARY ANGIOPLASTY WITH STENT PLACEMENT     CORONARY ARTERY BYPASS GRAFT     FINGER SURGERY  04/2018   Small finger left hand   implantable loop recorder placement  07/02/2019   Medtronic Reveal [place] model LNQ11 (SN [id number]) implanted in office by Dr Rayann Heman   TEE WITHOUT CARDIOVERSION N/A 03/30/2018   Procedure:  TRANSESOPHAGEAL ECHOCARDIOGRAM (TEE);  Surgeon: [name], MD;  Location: [place];  Service: Cardiovascular;  Laterality: N/A;   TEE WITHOUT CARDIOVERSION N/A 03/23/2021   Procedure: TRANSESOPHAGEAL ECHOCARDIOGRAM (TEE);  Surgeon: Acie Fredrickson Wonda Cheng, MD;  Location: [facility];  Service: Cardiovascular;  Laterality: N/A;   TOTAL HIP ARTHROPLASTY Left    TOTAL HIP ARTHROPLASTY Right 09/12/2018   Procedure: TOTAL HIP ARTHROPLASTY ANTERIOR APPROACH;  Surgeon: [name], MD;  Location: [facility];  Service: Orthopedics;  Laterality: Right;  70 mins    Family History  Problem Relation  Age of Onset   Colon cancer Mother    Cancer Father        UNKNOWN TYPE   Heart disease Paternal Grandmother    Heart disease Paternal Grandfather    Esophageal cancer Neg Hx    Rectal cancer Neg Hx    Stomach cancer Neg Hx     Social History   Socioeconomic History   Marital status: Married    Spouse name: Not on file   Number of children: 0   Years of education: Not on file   Highest education level: Not on file  Occupational History   Occupation: retired  Tobacco Use   Smoking status: Former    Packs/day: 1.00    Years: 7.00    Pack years: 7.00    Types: Cigarettes    Quit date: 10/04/1977    Years since quitting: 43.8   Smokeless tobacco: Never   Tobacco comments:    never smoked over 1 pack   Vaping Use   Vaping Use: Never used  Substance and Sexual Activity   Alcohol use: Yes    Alcohol/week: 3.0 - 4.0 standard drinks    Types: 1 Glasses of wine, 2 - 3 Shots of liquor per week    Comment: DAILY   Drug use: No    Comment: Smoked Marijuana back in 1970s   Sexual activity: Yes  Other Topics Concern   Not on file  Social History Narrative   Married, no children   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 - returned to [employer] from [place] 2014   Enjoys bird watching, reading historical books about WWII, watching tv   Social Determinants of Radio broadcast assistant Strain: Low Risk    Difficulty of Paying Living Expenses: Not hard at [employer] Insecurity: Not on file  Transportation Needs: No Transportation Needs   Lack of Transportation (Medical): No   Lack of Transportation (Non-Medical): No  Physical Activity: Inactive   Days of Exercise per Week: 0 days   Minutes of Exercise per Session: 0 min  Stress: No Stress Concern Present   Feeling of Stress : Not at all  Social Connections: Moderately Isolated   Frequency of Communication with Friends and Family: More than three times a week   Frequency of Social Gatherings with  Friends and Family: Never   Attends Religious Services: Never   Marine scientist or Organizations: No   Attends Music therapist: Never   Marital Status: Married  Human resources officer Violence: Not At Risk   Fear of Current or Ex-Partner: No   Emotionally Abused: No   Physically Abused: No   Sexually Abused: No    Outpatient Medications Prior to Visit  Medication Sig Dispense Refill   acetaminophen (TYLENOL) 325 MG tablet Take 325 mg by mouth every 8 (eight) hours as needed for moderate pain.  allopurinol (ZYLOPRIM) 300 MG tablet TAKE 1 TABLET(300 MG) BY MOUTH DAILY (Patient taking differently: Take 300 mg by mouth daily.) 90 tablet 3   amiodarone (PACERONE) 200 MG tablet Take 1 tablet (200 mg total) by mouth daily. 90 tablet 1   B COMPLEX-C-FOLIC ACID PO Take 1 tablet by mouth daily.     Blood Glucose Monitoring Suppl (ONETOUCH VERIO) w/Device KIT Use 1-4 times daily as needed/directed DX E11.9 1 kit 0   divalproex (DEPAKOTE SPRINKLE) 125 MG capsule Take 125 mg by mouth 3 (three) times daily.     DULoxetine (CYMBALTA) 60 MG capsule TAKE 1 CAPSULE(60 MG) BY MOUTH DAILY (Patient taking differently: Take 60 mg by mouth daily.) 90 capsule 1   ELIQUIS 5 MG TABS tablet TAKE 1 TABLET(5 MG) BY MOUTH TWICE DAILY (Patient taking differently: Take 5 mg by mouth 2 (two) times daily.) 180 tablet 1   Fluticasone-Umeclidin-Vilant (TRELEGY ELLIPTA) 100-62.5-25 MCG/INH AEPB Inhale 1 puff into the lungs daily. 60 each 3   gabapentin (NEURONTIN) 300 MG capsule TAKE 2 CAPSULES BY MOUTH AT NIGHT AS NEEDED FOR RESTLESS LEGS OR SYMPTOMS (Patient taking differently: Take 600 mg by mouth at bedtime as needed (restless legs or symptoms).) 180 capsule 3   HM LIDOCAINE PATCH EX Apply 1 patch topically as needed (pain).     Insulin Pen Needle (NOVOFINE PEN NEEDLE) 32G X 6 MM MISC Use 1-4 times daily as needed for insulin 100 each 3   JANUVIA 100 MG tablet TAKE 1 TABLET(100 MG) BY MOUTH DAILY (Patient  taking differently: Take 100 mg by mouth daily.) 90 tablet 1   JARDIANCE 25 MG TABS tablet TAKE 1 TABLET(25 MG) BY MOUTH DAILY BEFORE AND BREAKFAST (Patient taking differently: Take 25 mg by mouth daily.) 90 tablet 1   Lancets (ONETOUCH ULTRASOFT) lancets Use 1-4 times daily as needed/directed  DX E11.9 200 each 12   Magnesium Oxide 400 MG CAPS Take 1 capsule (400 mg total) by mouth daily. 90 capsule 1   metFORMIN (GLUCOPHAGE-XR) 500 MG 24 hr tablet Take 1 tablet (500 mg total) by mouth in the morning and at bedtime. 180 tablet 3   montelukast (SINGULAIR) 10 MG tablet TAKE 1 TABLET BY MOUTH AT BEDTIME (Patient taking differently: Take 10 mg by mouth at bedtime.) 90 tablet 3   Multiple Vitamin (MULTIVITAMIN WITH MINERALS) TABS tablet Take 1 tablet by mouth daily.     ONETOUCH VERIO test strip Use 1-4 times daily as directed/needed   DX E11.9 200 each 12   rosuvastatin (CRESTOR) 40 MG tablet Take 40 mg by mouth daily.     thiamine (VITAMIN B-1) 100 MG tablet Take 500 mg by mouth daily.     TOUJEO SOLOSTAR 300 UNIT/ML Solostar Pen Inject 80 Units into the skin at bedtime.     metoprolol tartrate (LOPRESSOR) 50 MG tablet Take 1 tablet (50 mg total) by mouth 2 (two) times daily.     No facility-administered medications prior to visit.    Allergies  Allergen Reactions   Xarelto [Rivaroxaban] Other (See Comments)    Patient stated he "ended up in the hospital with a rectal bleed"   Amoxicillin Rash and Other (See Comments)    RASH IN GROIN AREA Has patient had a PCN reaction causing immediate rash, facial/tongue/throat swelling, SOB or lightheadedness with hypotension: no Has patient had a PCN reaction causing severe rash involving mucus membranes or skin necrosis: no Has patient had a PCN reaction that required hospitalization: no Has patient had  a PCN reaction occurring within the last 10 years: yes If all of the above answers are "NO", then may proceed with Cephalosporin use.    Augmentin  [Amoxicillin-Pot Clavulanate] Rash and Other (See Comments)    RASH IN GROIN AREA   Azithromycin Rash and Other (See Comments)    RASH IN GROIN AREA   Clindamycin/Lincomycin Rash   Keflex [Cephalexin] Rash    ROS Review of Systems  Constitutional:  Positive for fatigue. Negative for chills and fever.  Eyes:  Negative for visual disturbance.  Respiratory:  Positive for cough. Negative for chest tightness and shortness of breath.   Cardiovascular:  Negative for chest pain and palpitations.  Neurological:  Negative for syncope, weakness, light-headedness and headaches.     Objective:    Physical Exam Vitals reviewed.  Constitutional:      Comments: He has chronic slurred speech related to his Wernicke encephalopathy  Cardiovascular:     Rate and Rhythm: Normal rate.  Pulmonary:     Effort: Pulmonary effort is normal.     Breath sounds: Normal breath sounds.    BP (!) 142/84 (BP Location: Left Arm, Patient Position: Sitting, Cuff Size: Normal)   Pulse 87   Temp 97.7 F (36.5 C) (Oral)   Wt 238 lb 9.6 oz (108.2 kg)   SpO2 96%   BMI 34.24 kg/m  Wt Readings from Last 3 Encounters:  08/05/21 238 lb 9.6 oz (108.2 kg)  07/30/21 234 lb 3.2 oz (106.2 kg)  07/30/21 234 lb 3.2 oz (106.2 kg)     Health Maintenance Due  Topic Date Due   TETANUS/TDAP  10/07/2019   FOOT EXAM  09/23/2020   COVID-19 Vaccine (4 - Booster for Pfizer series) 11/23/2020    There are no preventive care reminders to display for this patient.  Lab Results  Component Value Date   TSH 1.984 07/30/2021   Lab Results  Component Value Date   WBC 5.7 08/01/2021   HGB 11.9 (L) 08/01/2021   HCT 34.4 (L) 08/01/2021   MCV 96.1 08/01/2021   PLT 187 08/01/2021   Lab Results  Component Value Date   NA 139 08/02/2021   K 3.5 08/02/2021   CO2 24 08/02/2021   GLUCOSE 123 (H) 08/02/2021   BUN <5 (L) 08/02/2021   CREATININE 0.91 08/02/2021   BILITOT 0.9 07/30/2021   ALKPHOS 48 07/30/2021   AST 28  07/30/2021   ALT 16 07/30/2021   PROT 6.9 07/30/2021   ALBUMIN 3.9 07/30/2021   CALCIUM 9.0 08/02/2021   ANIONGAP 7 08/02/2021   EGFR 96 12/08/2020   GFR 75.74 12/03/2019   Lab Results  Component Value Date   CHOL 137 09/23/2020   Lab Results  Component Value Date   HDL 40 09/23/2020   Lab Results  Component Value Date   LDLCALC 64 09/23/2020   Lab Results  Component Value Date   TRIG 280 (H) 09/23/2020   Lab Results  Component Value Date   CHOLHDL 3.4 09/23/2020   Lab Results  Component Value Date   HGBA1C 6.4 (A) 06/24/2021      Assessment & Plan:   #1 recent severe hypokalemia.  Replaced during recent hospital admission.  Magnesium level was normal.  Suspect this drop predominantly based on his alcoholism and poor intake.  No diuretic use.  No recent PPI use.  No diarrhea or vomiting. -Recheck basic metabolic panel today. -Discussed importance of getting more balanced diet but he continues to abuse alcohol which makes  this very challenging  #2 prolonged QT interval history.  Recently increased in setting of hypokalemia.  EKG repeated today shows stable QT interval.  #3 cough.  Patient nontoxic in appearance.  Afebrile.  Obtain PA and lateral chest x-ray.  #4 type 2 diabetes-stable with recent A1c 6.4% in September.  High risk for hypoglycemia with his alcohol abuse history   No orders of the defined types were placed in this encounter.   Follow-up: Return in about 1 month (around 09/04/2021).    Carolann Littler, MD

## 2021-08-06 LAB — CBC WITH DIFFERENTIAL/PLATELET
Basophils Absolute: 0 10*3/uL (ref 0.0–0.1)
Basophils Relative: 0.5 % (ref 0.0–3.0)
Eosinophils Absolute: 0.2 10*3/uL (ref 0.0–0.7)
Eosinophils Relative: 2.2 % (ref 0.0–5.0)
HCT: 40.6 % (ref 39.0–52.0)
Hemoglobin: 13.7 g/dL (ref 13.0–17.0)
Lymphocytes Relative: 22.9 % (ref 12.0–46.0)
Lymphs Abs: 1.6 10*3/uL (ref 0.7–4.0)
MCHC: 33.7 g/dL (ref 30.0–36.0)
MCV: 99.5 fl (ref 78.0–100.0)
Monocytes Absolute: 1.2 10*3/uL — ABNORMAL HIGH (ref 0.1–1.0)
Monocytes Relative: 18 % — ABNORMAL HIGH (ref 3.0–12.0)
Neutro Abs: 3.8 10*3/uL (ref 1.4–7.7)
Neutrophils Relative %: 56.4 % (ref 43.0–77.0)
Platelets: 283 10*3/uL (ref 150.0–400.0)
RBC: 4.08 Mil/uL — ABNORMAL LOW (ref 4.22–5.81)
RDW: 17.6 % — ABNORMAL HIGH (ref 11.5–15.5)
WBC: 6.8 10*3/uL (ref 4.0–10.5)

## 2021-08-06 LAB — BASIC METABOLIC PANEL
BUN: 10 mg/dL (ref 6–23)
CO2: 28 mEq/L (ref 19–32)
Calcium: 9.8 mg/dL (ref 8.4–10.5)
Chloride: 103 mEq/L (ref 96–112)
Creatinine, Ser: 1.06 mg/dL (ref 0.40–1.50)
GFR: 70.8 mL/min (ref >60.00)
Glucose, Bld: 178 mg/dL — ABNORMAL HIGH (ref 70–99)
Potassium: 3.8 mEq/L (ref 3.5–5.1)
Sodium: 140 mEq/L (ref 135–145)

## 2021-08-10 ENCOUNTER — Ambulatory Visit: Payer: Medicare Other | Admitting: Neurology

## 2021-08-10 ENCOUNTER — Encounter: Payer: Self-pay | Admitting: Neurology

## 2021-08-10 VITALS — BP 156/80 | Ht 70.0 in | Wt 238.0 lb

## 2021-08-10 DIAGNOSIS — R2681 Unsteadiness on feet: Secondary | ICD-10-CM

## 2021-08-10 DIAGNOSIS — E512 Wernicke's encephalopathy: Secondary | ICD-10-CM

## 2021-08-10 DIAGNOSIS — F1010 Alcohol abuse, uncomplicated: Secondary | ICD-10-CM | POA: Diagnosis not present

## 2021-08-10 DIAGNOSIS — G1222 Progressive bulbar palsy: Secondary | ICD-10-CM | POA: Diagnosis not present

## 2021-08-10 NOTE — Patient Instructions (Signed)
Continue current medication  Follow up with your PCP for possible swallow evaluation to rule out silent aspiration  Follow up with PCP for referral to alcohol abuse cessation, possibly rehab Return if worse

## 2021-08-10 NOTE — Progress Notes (Signed)
[facility]  PATIENT: Adam [name] DOB: March 06, 1951  REFERRING CLINICIAN: [name], MD HISTORY FROM: Patient and spouse [name] REASON FOR VISIT: Slurred speech/ Balance issues and multiple falls.    HISTORICAL  CHIEF COMPLAINT:  Chief Complaint  Patient presents with   New Patient (Initial Visit)    Rm 13,  progressive slurred speech, balance issues, reports 3 falls recently     HISTORY OF PRESENT ILLNESS:  This is a 70 year old gentleman with a history of chronic alcohol abuse, COPD, heart disease, gout, depression, diabetes mellitus and, hyperlipidemia and recently diagnosed with Wernicke encephalopathy last year, and after discharged, was in rehab for 81-monthand has been home since December 2021.  Patient said that since being home, he has been having multiple falls, had difficulty with speech, that he described as slurred speech said that he follow-up with his primary care doctor has carotid checked, was told they were fine.  He does complain of incontinence and constipation.  He also reported history of hallucination, on 2 occasions. The first one, he was talking to his brother and the second episode he was talking to his nephew and they were both not in the house.  Patient reported that he is more forgetful, he continued to fall, last fall was a week ago.  Patient also reported he continued to drink, he drinks heavily and daily,, has a eye-opening in the morning and now said that drinking has restarted since being home after his rehab stay.  He has not cut down on his alcohol intake.  He is concerned that he has Lewy body dementia   OTHER MEDICAL CONDITIONS: Chronic alcohol, COPD, Heart disease, DM, Gout, Depression, HLD,    REVIEW OF SYSTEMS: Full 14 system review of systems performed and negative with exception of: as noted in the HPI   ALLERGIES: Allergies  Allergen Reactions   Xarelto [Rivaroxaban] Other (See Comments)    Patient stated he  "ended up in the hospital with a rectal bleed"   Amoxicillin Rash and Other (See Comments)    RASH IN GROIN AREA Has patient had a PCN reaction causing immediate rash, facial/tongue/throat swelling, SOB or lightheadedness with hypotension: no Has patient had a PCN reaction causing severe rash involving mucus membranes or skin necrosis: no Has patient had a PCN reaction that required hospitalization: no Has patient had a PCN reaction occurring within the last 10 years: yes If all of the above answers are "NO", then may proceed with Cephalosporin use.    Augmentin [Amoxicillin-Pot Clavulanate] Rash and Other (See Comments)    RASH IN GROIN AREA   Azithromycin Rash and Other (See Comments)    RASH IN GROIN AREA   Clindamycin/Lincomycin Rash   Keflex [Cephalexin] Rash    HOME MEDICATIONS: Outpatient Medications Prior to Visit  Medication Sig Dispense Refill   acetaminophen (TYLENOL) 325 MG tablet Take 325 mg by mouth every 8 (eight) hours as needed for moderate pain.     allopurinol (ZYLOPRIM) 300 MG tablet TAKE 1 TABLET(300 MG) BY MOUTH DAILY (Patient taking differently: Take 300 mg by mouth daily.) 90 tablet 3   amiodarone (PACERONE) 200 MG tablet Take 1 tablet (200 mg total) by mouth daily. 90 tablet 1   B COMPLEX-C-FOLIC ACID PO Take 1 tablet by mouth daily.     Blood Glucose Monitoring Suppl (ONETOUCH VERIO) w/Device KIT Use 1-4 times daily as needed/directed DX E11.9 1 kit 0   divalproex (DEPAKOTE SPRINKLE) 125 MG capsule Take 125  mg by mouth 3 (three) times daily.     DULoxetine (CYMBALTA) 60 MG capsule TAKE 1 CAPSULE(60 MG) BY MOUTH DAILY (Patient taking differently: Take 60 mg by mouth daily.) 90 capsule 1   ELIQUIS 5 MG TABS tablet TAKE 1 TABLET(5 MG) BY MOUTH TWICE DAILY (Patient taking differently: Take 5 mg by mouth 2 (two) times daily.) 180 tablet 1   Fluticasone-Umeclidin-Vilant (TRELEGY ELLIPTA) 100-62.5-25 MCG/INH AEPB Inhale 1 puff into the lungs daily. 60 each 3    gabapentin (NEURONTIN) 300 MG capsule TAKE 2 CAPSULES BY MOUTH AT NIGHT AS NEEDED FOR RESTLESS LEGS OR SYMPTOMS (Patient taking differently: Take 600 mg by mouth at bedtime as needed (restless legs or symptoms).) 180 capsule 3   HM LIDOCAINE PATCH EX Apply 1 patch topically as needed (pain).     Insulin Pen Needle (NOVOFINE PEN NEEDLE) 32G X 6 MM MISC Use 1-4 times daily as needed for insulin 100 each 3   JANUVIA 100 MG tablet TAKE 1 TABLET(100 MG) BY MOUTH DAILY (Patient taking differently: Take 100 mg by mouth daily.) 90 tablet 1   JARDIANCE 25 MG TABS tablet TAKE 1 TABLET(25 MG) BY MOUTH DAILY BEFORE AND BREAKFAST (Patient taking differently: Take 25 mg by mouth daily.) 90 tablet 1   Lancets (ONETOUCH ULTRASOFT) lancets Use 1-4 times daily as needed/directed  DX E11.9 200 each 12   Magnesium Oxide 400 MG CAPS Take 1 capsule (400 mg total) by mouth daily. 90 capsule 1   metFORMIN (GLUCOPHAGE-XR) 500 MG 24 hr tablet Take 1 tablet (500 mg total) by mouth in the morning and at bedtime. 180 tablet 3   montelukast (SINGULAIR) 10 MG tablet TAKE 1 TABLET BY MOUTH AT BEDTIME (Patient taking differently: Take 10 mg by mouth at bedtime.) 90 tablet 3   Multiple Vitamin (MULTIVITAMIN WITH MINERALS) TABS tablet Take 1 tablet by mouth daily.     ONETOUCH VERIO test strip Use 1-4 times daily as directed/needed   DX E11.9 200 each 12   rosuvastatin (CRESTOR) 40 MG tablet Take 40 mg by mouth daily.     thiamine (VITAMIN B-1) 100 MG tablet Take 500 mg by mouth daily.     TOUJEO SOLOSTAR 300 UNIT/ML Solostar Pen Inject 80 Units into the skin at bedtime.     metoprolol tartrate (LOPRESSOR) 50 MG tablet Take 1 tablet (50 mg total) by mouth 2 (two) times daily.     No facility-administered medications prior to visit.    PAST MEDICAL HISTORY: Past Medical History:  Diagnosis Date   Alcohol withdrawal ([place]) 10/24/2019   Allergy    Anxiety    history of PTSD following CABG   Ascending aortic aneurysm 01/31/2018    43 x 42 mm, pt unaware   Asthma    Cardiomegaly 10/17/2017   Colitis- colonoscopy 2014 07/13/2015   COPD (chronic obstructive pulmonary disease) ([place])    Coronary artery disease    x 6   Depression    Diabetes mellitus without complication ([place])    Family history of polyps in the colon    Finger dislocation    Left pinkie   GERD (gastroesophageal reflux disease)    Gout    H/O atrial fibrillation without current medication    following CABG with no documented episodes since then.   Heart palpitations    Hx of adenomatous colonic polyps 08/12/2010   Hyperlipidemia    Hypertension    OA (osteoarthritis)    OSA (obstructive sleep apnea)    Mild,  has not received CPAP yet   Prediabetes    Protein-calorie malnutrition, severe 03/01/2020   RLS (restless legs syndrome)    Squamous cell carcinoma of scalp 2016   Moh's    PAST SURGICAL HISTORY: Past Surgical History:  Procedure Laterality Date   ANKLE FRACTURE SURGERY Right 1991   APPENDECTOMY     ATRIAL FIBRILLATION ABLATION N/A 03/31/2018   Procedure: ATRIAL FIBRILLATION ABLATION;  Surgeon: [name], MD;  Location: [facility];  Service: Cardiovascular;  Laterality: N/A;   CARDIAC ELECTROPHYSIOLOGY MAPPING AND ABLATION     CARDIOVERSION N/A 03/23/2021   Procedure: CARDIOVERSION;  Surgeon: Acie Fredrickson Wonda Cheng, MD;  Location: [facility];  Service: Cardiovascular;  Laterality: N/A;   COLONOSCOPY  06/11/2021   COLONOSCOPY W/ BIOPSIES  2017   x7   CORONARY ANGIOPLASTY WITH STENT PLACEMENT     CORONARY ARTERY BYPASS GRAFT     FINGER SURGERY  04/2018   Small finger left hand   implantable loop recorder placement  07/02/2019   Medtronic Reveal [place] model LNQ11 (SN [id number]) implanted in office by Dr Rayann Heman   TEE WITHOUT CARDIOVERSION N/A 03/30/2018   Procedure: TRANSESOPHAGEAL ECHOCARDIOGRAM (TEE);  Surgeon: [name], MD;  Location: [facility];  Service: Cardiovascular;  Laterality: N/A;   TEE WITHOUT  CARDIOVERSION N/A 03/23/2021   Procedure: TRANSESOPHAGEAL ECHOCARDIOGRAM (TEE);  Surgeon: Acie Fredrickson Wonda Cheng, MD;  Location: [facility];  Service: Cardiovascular;  Laterality: N/A;   TOTAL HIP ARTHROPLASTY Left    TOTAL HIP ARTHROPLASTY Right 09/12/2018   Procedure: TOTAL HIP ARTHROPLASTY ANTERIOR APPROACH;  Surgeon: [name], MD;  Location: [facility];  Service: Orthopedics;  Laterality: Right;  70 mins    FAMILY HISTORY: Family History  Problem Relation Age of Onset   Colon cancer Mother    Cancer Father        UNKNOWN TYPE   Heart disease Paternal Grandmother    Heart disease Paternal Grandfather    Esophageal cancer Neg Hx    Rectal cancer Neg Hx    Stomach cancer Neg Hx     SOCIAL HISTORY: Social History   Socioeconomic History   Marital status: Married    Spouse name: Not on file   Number of children: 0   Years of education: Not on file   Highest education level: Not on file  Occupational History   Occupation: retired  Tobacco Use   Smoking status: Former    Packs/day: 1.00    Years: 7.00    Pack years: 7.00    Types: Cigarettes    Quit date: 10/04/1977    Years since quitting: 43.8   Smokeless tobacco: Never   Tobacco comments:    never smoked over 1 pack   Vaping Use   Vaping Use: Never used  Substance and Sexual Activity   Alcohol use: Yes    Alcohol/week: 3.0 - 4.0 standard drinks    Types: 1 Glasses of wine, 2 - 3 Shots of liquor per week    Comment: DAILY   Drug use: No    Comment: Smoked Marijuana back in 1970s   Sexual activity: Yes  Other Topics Concern   Not on file  Social History Narrative   Married, no children   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 - returned to [employer] from [employer]   Enjoys bird watching, reading historical books about WWII, watching tv   Social Determinants of Radio broadcast assistant Strain:  Low Risk    Difficulty of Paying Living Expenses: Not hard at all  Food Insecurity:  Not on file  Transportation Needs: No Transportation Needs   Lack of Transportation (Medical): No   Lack of Transportation (Non-Medical): No  Physical Activity: Inactive   Days of Exercise per Week: 0 days   Minutes of Exercise per Session: 0 min  Stress: No Stress Concern Present   Feeling of Stress : Not at all  Social Connections: Moderately Isolated   Frequency of Communication with Friends and Family: More than three times a week   Frequency of Social Gatherings with Friends and Family: Never   Attends Religious Services: Never   Marine scientist or Organizations: No   Attends Music therapist: Never   Marital Status: Married  Human resources officer Violence: Not At Risk   Fear of Current or Ex-Partner: No   Emotionally Abused: No   Physically Abused: No   Sexually Abused: No     PHYSICAL EXAM  GENERAL EXAM/CONSTITUTIONAL: Vitals:  Vitals:   08/10/21 1437  BP: (!) 156/80  Weight: 238 lb (108 kg)  Height: 5' 10" (1.778 m)   Body mass index is 34.15 kg/m. Wt Readings from Last 3 Encounters:  08/10/21 238 lb (108 kg)  08/05/21 238 lb 9.6 oz (108.2 kg)  07/30/21 234 lb 3.2 oz (106.2 kg)   Patient is in no distress; well developed, nourished and groomed; neck is supple. He is diaphoretic and has mild tremors.   EYES: Pupils round and reactive to light, Visual fields full to confrontation, Extraocular movements intacts,   MUSCULOSKELETAL: Gait, strength, tone, movements noted in Neurologic exam below  NEUROLOGIC: MENTAL STATUS:  MMSE - Mini Mental State Exam 06/26/2018 06/08/2017  Not completed: (No Data) (No Data)   awake, alert, oriented to person, place and time recent and remote memory intact normal attention and concentration Extensive slow and slurred speech, comprehension intact, naming intact fund of knowledge appropriate  CRANIAL NERVE:  2nd, 3rd, 4th, 6th - pupils equal and reactive to light, visual fields full to confrontation,  extraocular muscles intact, no nystagmus 5th - facial sensation symmetric 7th - facial strength symmetric 8th - hearing intact 9th - palate elevates symmetrically, uvula midline 11th - shoulder shrug symmetric 12th - tongue protrusion midline  MOTOR:  normal bulk and tone, full strength in the BUE, BLE  SENSORY:  normal and symmetric to light touch, pinprick, temperature, vibration  COORDINATION:  finger-nose-finger, fine finger movements normal. Mild tremors noted on extension of both arms.   REFLEXES:  deep tendon reflexes present and symmetric  GAIT/STATION:  normal    DIAGNOSTIC DATA (LABS, IMAGING, TESTING) - I reviewed patient records, labs, notes, testing and imaging myself where available.  Lab Results  Component Value Date   WBC 6.8 08/05/2021   HGB 13.7 08/05/2021   HCT 40.6 08/05/2021   MCV 99.5 08/05/2021   PLT 283.0 08/05/2021      Component Value Date/Time   NA 140 08/05/2021 1557   NA 143 12/08/2020 1459   K 3.8 08/05/2021 1557   CL 103 08/05/2021 1557   CO2 28 08/05/2021 1557   GLUCOSE 178 (H) 08/05/2021 1557   BUN 10 08/05/2021 1557   BUN 12 12/08/2020 1459   CREATININE 1.06 08/05/2021 1557   CREATININE 0.93 09/23/2020 1216   CALCIUM 9.8 08/05/2021 1557   PROT 6.9 07/30/2021 1642   ALBUMIN 3.9 07/30/2021 1642   AST 28 07/30/2021 1642   ALT 16  07/30/2021 1642   ALKPHOS 48 07/30/2021 1642   BILITOT 0.9 07/30/2021 1642   GFRNONAA >60 08/02/2021 0229   GFRAA 115 08/22/2020 0921   Lab Results  Component Value Date   CHOL 137 09/23/2020   HDL 40 09/23/2020   LDLCALC 64 09/23/2020   LDLDIRECT 124.0 06/23/2018   TRIG 280 (H) 09/23/2020   CHOLHDL 3.4 09/23/2020   Lab Results  Component Value Date   HGBA1C 6.4 (A) 06/24/2021   Lab Results  Component Value Date   ZMOQHUTM54 650 02/09/2020   Lab Results  Component Value Date   TSH 1.984 07/30/2021    MRI Brain  No evidence of acute infarction. Moderate generalized parenchymal  atrophy, stable as compared to the brain MRI of 02/10/2020. Ventriculomegaly is also unchanged and favored secondary to global atrophy. However, as before a component of normal pressure hydrocephalus is difficult to exclude and clinical correlation is recommended. Stable mild chronic small vessel ischemic changes within the cerebral white matter and pons   Swallow evaluation 02/2020 Pt presents with mild pharyngeal dysphagia characterized by a pharyngeal delay which resulted in penetration (PAS 3) of thin liquids via straw when consecutive swallows of used. Aspiration was not observed during the study but aspiration of penetrated material is likely. A regular texture diet with thin liquids is recommended at this time with observance of swallowing precautions. SLP will follow for dysphagia treatment.      ASSESSMENT AND PLAN  70 y.o. year old male with chronic alcoholism, recently diagnosed with Wernicke's encephalopathy, currently still drinking alcohol, hypertension, DM, hyperlipidemia, depression, gout who is presenting with concern of Lewy body dementia.  On exam patient did not exhibit any evidence of dementia, he was able to provide a clear and consistent history and his mental examination is within normal limits.  I have explained to him that even though he had those episodes of hallucinations, he does not have the other features of Lewy body dementia including dementia and other psychiatric issues.  His brain MRI show possibility of normal pressure hydrocephalus.  I explained to the patient that with his incontinence, fall and hallucination he may have normal pressure hydrocephalus that can explain the symptoms.  But I believe the main issue for the patient is his alcohol abuse.  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 The fall may be related to him being intoxicated, his incontinence might be related to him drinking a  lot, he continues to drink daily, and I advised patient before we start a work-up for his NPH he needs to be sober, at least cut his alcohol.  I am afraid also with his bulbar palsy, he might have silent aspiration, he did have a swallow evaluation last year, but I will still recommend him to follow-up with his primary care doctor for a possible repeat swallow evaluation done.  I also advised him to follow-up with Dr. Elease Hashimoto for referral to rehab/alcohol abuse therapy/counselor, [facility],  or professional and they can help him with his alcohol abuse disorder.   1. Alcohol abuse   2. Gait instability   3. Wernicke encephalopathy   4. Bulbar palsy (HCC)      PLAN: Continue current medications  Follow up with your PCP for possible swallow evaluation to rule out silent aspiration  Follow up with PCP for referral to alcohol abuse cessation, possibly rehab Return if worse    No orders of the defined types  were placed in this encounter.   No orders of the defined types were placed in this encounter.   Return if symptoms worsen or fail to improve.    [name], MD 08/11/2021, 12:20 PM  [facility] [address] [place], [place] [address] [phone]

## 2021-08-17 ENCOUNTER — Ambulatory Visit (INDEPENDENT_AMBULATORY_CARE_PROVIDER_SITE_OTHER): Payer: Medicare Other

## 2021-08-17 DIAGNOSIS — I480 Paroxysmal atrial fibrillation: Secondary | ICD-10-CM

## 2021-08-18 DIAGNOSIS — L814 Other melanin hyperpigmentation: Secondary | ICD-10-CM | POA: Diagnosis not present

## 2021-08-18 DIAGNOSIS — L309 Dermatitis, unspecified: Secondary | ICD-10-CM | POA: Diagnosis not present

## 2021-08-18 DIAGNOSIS — L821 Other seborrheic keratosis: Secondary | ICD-10-CM | POA: Diagnosis not present

## 2021-08-18 DIAGNOSIS — D1801 Hemangioma of skin and subcutaneous tissue: Secondary | ICD-10-CM | POA: Diagnosis not present

## 2021-08-18 DIAGNOSIS — L280 Lichen simplex chronicus: Secondary | ICD-10-CM | POA: Diagnosis not present

## 2021-08-18 LAB — CUP PACEART REMOTE DEVICE CHECK
Date Time Interrogation Session: 20221114031855
Implantable Pulse Generator Implant Date: 20200928

## 2021-08-23 ENCOUNTER — Other Ambulatory Visit: Payer: Self-pay | Admitting: Family Medicine

## 2021-08-24 NOTE — Telephone Encounter (Signed)
Please advise. Last filled by a historical provider.

## 2021-08-24 NOTE — Progress Notes (Signed)
[facility] Summary Report / Loop Recorder 

## 2021-09-02 ENCOUNTER — Other Ambulatory Visit: Payer: Self-pay | Admitting: Family Medicine

## 2021-09-03 DIAGNOSIS — L309 Dermatitis, unspecified: Secondary | ICD-10-CM | POA: Diagnosis not present

## 2021-09-04 ENCOUNTER — Other Ambulatory Visit: Payer: Self-pay | Admitting: Family Medicine

## 2021-09-08 ENCOUNTER — Encounter: Payer: Self-pay | Admitting: Family Medicine

## 2021-09-08 ENCOUNTER — Telehealth: Payer: Self-pay | Admitting: Neurology

## 2021-09-08 DIAGNOSIS — R1310 Dysphagia, unspecified: Secondary | ICD-10-CM

## 2021-09-08 NOTE — Telephone Encounter (Signed)
I returned the call to his wife. Reports the patient has reduced his alcohol intake from "4-5 scotch drinks per day to 1-2 per day". She was unable to tell me the total number of ounces, stating he uses different size glasses.  ______________________________________  Per last note on 08/05/21:  Follow up with your PCP for possible swallow evaluation to rule out silent aspiration  Follow up with PCP for referral to alcohol abuse cessation, possibly rehab. ______________________________________ He has not been seen by his PCP yet. His wife is in agreement to call them to day and schedule an appointment. ______________________________________ She would like a call back from Dr. April Manson next week upon his return. She would like to discuss next steps in his care since he has decreased his drinking. Specifically, she wants to know if he can move forward with the LP.

## 2021-09-08 NOTE — Telephone Encounter (Signed)
I have ordered repeat diagnostic swallowing function evaluation with speech therapy

## 2021-09-08 NOTE — Telephone Encounter (Signed)
Pt's wife, Adison Jerger (on [place]) called, he has decided to get the lumbar drain on the brain we discussed last OV. Would like a call from the nurse to discuss that procedure.

## 2021-09-11 ENCOUNTER — Other Ambulatory Visit (HOSPITAL_COMMUNITY): Payer: Self-pay

## 2021-09-11 ENCOUNTER — Other Ambulatory Visit: Payer: Self-pay | Admitting: Family Medicine

## 2021-09-11 DIAGNOSIS — R1310 Dysphagia, unspecified: Secondary | ICD-10-CM

## 2021-09-11 DIAGNOSIS — R059 Cough, unspecified: Secondary | ICD-10-CM

## 2021-09-12 ENCOUNTER — Other Ambulatory Visit: Payer: Self-pay | Admitting: Family Medicine

## 2021-09-14 NOTE — Telephone Encounter (Signed)
Left a message for wife advising her to follow up with PCP to start the process of alcohol cessation, rehab and when she is ready to move on, to let me know and I will refer her.

## 2021-09-21 ENCOUNTER — Ambulatory Visit (INDEPENDENT_AMBULATORY_CARE_PROVIDER_SITE_OTHER): Payer: Medicare Other

## 2021-09-21 DIAGNOSIS — I480 Paroxysmal atrial fibrillation: Secondary | ICD-10-CM

## 2021-09-22 LAB — CUP PACEART REMOTE DEVICE CHECK
Date Time Interrogation Session: 20221217032438
Implantable Pulse Generator Implant Date: 20200928

## 2021-09-24 ENCOUNTER — Ambulatory Visit (HOSPITAL_COMMUNITY)
Admission: RE | Admit: 2021-09-24 | Discharge: 2021-09-24 | Disposition: A | Discharge status: Home / Self Care | Payer: Medicare Other | Source: Ambulatory Visit | Attending: Family Medicine | Admitting: Family Medicine

## 2021-09-24 ENCOUNTER — Other Ambulatory Visit: Payer: Self-pay

## 2021-09-24 DIAGNOSIS — R059 Cough, unspecified: Secondary | ICD-10-CM | POA: Diagnosis not present

## 2021-09-24 DIAGNOSIS — R1310 Dysphagia, unspecified: Secondary | ICD-10-CM | POA: Insufficient documentation

## 2021-09-26 ENCOUNTER — Other Ambulatory Visit: Payer: Self-pay | Admitting: Emergency Medicine

## 2021-09-30 NOTE — Progress Notes (Signed)
[facility] Summary Report / Loop Recorder 

## 2021-10-08 ENCOUNTER — Encounter: Payer: Self-pay | Admitting: Family Medicine

## 2021-10-14 ENCOUNTER — Ambulatory Visit (INDEPENDENT_AMBULATORY_CARE_PROVIDER_SITE_OTHER): Payer: Medicare Other | Admitting: Family Medicine

## 2021-10-14 VITALS — BP 150/80 | HR 80 | Temp 97.8°F | Wt 239.6 lb

## 2021-10-14 DIAGNOSIS — F1010 Alcohol abuse, uncomplicated: Secondary | ICD-10-CM | POA: Diagnosis not present

## 2021-10-14 DIAGNOSIS — R0989 Other specified symptoms and signs involving the circulatory and respiratory systems: Secondary | ICD-10-CM | POA: Diagnosis not present

## 2021-10-14 DIAGNOSIS — E512 Wernicke's encephalopathy: Secondary | ICD-10-CM | POA: Insufficient documentation

## 2021-10-14 DIAGNOSIS — L409 Psoriasis, unspecified: Secondary | ICD-10-CM

## 2021-10-14 DIAGNOSIS — E1142 Type 2 diabetes mellitus with diabetic polyneuropathy: Secondary | ICD-10-CM

## 2021-10-14 LAB — POCT GLYCOSYLATED HEMOGLOBIN (HGB A1C): Hemoglobin A1C: 6.7 % — AB (ref 4.0–5.6)

## 2021-10-14 MED ORDER — METHYLPREDNISOLONE ACETATE 80 MG/ML IJ SUSP
80.0000 mg | Freq: Once | INTRAMUSCULAR | Status: AC
  Administered 2021-10-14: 80 mg via INTRAMUSCULAR

## 2021-10-14 MED ORDER — IPRATROPIUM-ALBUTEROL 0.5-2.5 (3) MG/3ML IN SOLN
3.0000 mL | Freq: Once | RESPIRATORY_TRACT | Status: AC
  Administered 2021-10-14: 3 mL via RESPIRATORY_TRACT

## 2021-10-14 NOTE — Patient Instructions (Signed)
I would consider setting up pulmonary referral regarding the chronic cough  A1C remains fairly well controlled at 6.7%

## 2021-10-14 NOTE — Progress Notes (Signed)
Established Patient Office Visit  Subjective:  Patient ID: Adam [name], male    DOB: 1951/04/18  Age: 71 y.o. MRN: [id number]  CC:  Chief Complaint  Patient presents with   Follow-up    HPI Adam [name] presents for medical follow-up.  He has multiple medical problems.  He has history of CAD, hypertension, atrial fibrillation, COPD, GERD, type 2 diabetes, hyperlipidemia, osteoarthritis,alcohol abuse.   He came in today stating that he wishes to get some "fluid drained off his brain ".  He apparently is making reference to the fact that there is question of normal pressure hydrocephalus per visit with neurologist back in November.  This was noted from previous MRI scan over the summer. MRI last June showed ventriculomegaly favored secondary to global atrophy.  Cannot exclude component of normal pressure hydrocephalus.  No acute findings.  Ongoing alcohol abuse with no motivation to quit.  He saw neurologist back in November.  Had a long discussion with them regarding his issues.  He was advised very sternly to quit drinking but he continues to drink daily.  Was felt that he did not have significant dementia features including no evidence for Lewy body dementia.  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  There was consideration of considering work-up for normal pressure hydrocephalus if he became sober or at least cut back his alcohol but he refuses to do so.  There is also concern for recurrent aspiration with his bulbar palsy.  We set up swallowing study which was done few weeks ago and showed mild oropharyngeal dysphagia.  Felt to represent mild aspiration risk.  No specific treatment recommended.  He does have relatively chronic cough.  No ACE inhibitor use.  Denies any active GERD symptoms.  Not aware of any active postnasal drip.  No hemoptysis.  No fever.  Has a most daily cough productive  of clear sputum.  He is post to be on Trelegy but wife states he has difficulties with the inhaler.  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  Not monitoring blood sugars at home.  Last A1c 6.4%.  No recent hypoglycemic symptoms. Past Medical History:  Diagnosis Date   Alcohol withdrawal ([place]) 10/24/2019   Allergy    Anxiety    history of PTSD following CABG   Ascending aortic aneurysm 01/31/2018   43 x 42 mm, pt unaware   Asthma    Cardiomegaly 10/17/2017   Colitis- colonoscopy 2014 07/13/2015   COPD (chronic obstructive pulmonary disease) ([place])    Coronary artery disease    x 6   Depression    Diabetes mellitus without complication ([place])    Family history of polyps in the colon    Finger dislocation    Left pinkie   GERD (gastroesophageal reflux disease)    Gout    H/O atrial fibrillation without current medication    following CABG with no documented episodes since then.   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    Past Surgical  History:  Procedure Laterality Date   ANKLE FRACTURE SURGERY Right 1991   APPENDECTOMY     ATRIAL FIBRILLATION ABLATION N/A 03/31/2018   Procedure: ATRIAL FIBRILLATION ABLATION;  Surgeon: [name], MD;  Location: [facility];  Service: Cardiovascular;  Laterality: N/A;   CARDIAC ELECTROPHYSIOLOGY MAPPING AND ABLATION     CARDIOVERSION N/A 03/23/2021   Procedure: CARDIOVERSION;  Surgeon: Acie Fredrickson Wonda Cheng, MD;  Location: [facility];  Service: Cardiovascular;  Laterality: N/A;   COLONOSCOPY  06/11/2021   COLONOSCOPY W/ BIOPSIES  2017   x7   CORONARY ANGIOPLASTY WITH STENT PLACEMENT      CORONARY ARTERY BYPASS GRAFT     FINGER SURGERY  04/2018   Small finger left hand   implantable loop recorder placement  07/02/2019   Medtronic Reveal [place] model LNQ11 (SN [id number]) implanted in office by Dr Rayann Heman   TEE WITHOUT CARDIOVERSION N/A 03/30/2018   Procedure: TRANSESOPHAGEAL ECHOCARDIOGRAM (TEE);  Surgeon: [name], MD;  Location: [facility];  Service: Cardiovascular;  Laterality: N/A;   TEE WITHOUT CARDIOVERSION N/A 03/23/2021   Procedure: TRANSESOPHAGEAL ECHOCARDIOGRAM (TEE);  Surgeon: Acie Fredrickson Wonda Cheng, MD;  Location: [facility];  Service: Cardiovascular;  Laterality: N/A;   TOTAL HIP ARTHROPLASTY Left    TOTAL HIP ARTHROPLASTY Right 09/12/2018   Procedure: TOTAL HIP ARTHROPLASTY ANTERIOR APPROACH;  Surgeon: [name], MD;  Location: [facility];  Service: Orthopedics;  Laterality: Right;  70 mins    Family History  Problem Relation Age of Onset   Colon cancer Mother    Cancer Father        UNKNOWN TYPE   Heart disease Paternal Grandmother    Heart disease Paternal Grandfather    Esophageal cancer Neg Hx    Rectal cancer Neg Hx    Stomach cancer Neg Hx     Social History   Socioeconomic History   Marital status: Married    Spouse name: Not on file   Number of children: 0   Years of education: Not on file   Highest education level: Not on file  Occupational History   Occupation: retired  Tobacco Use   Smoking status: Former    Packs/day: 1.00    Years: 7.00    Pack years: 7.00    Types: Cigarettes    Quit date: 10/04/1977    Years since quitting: 44.0   Smokeless tobacco: Never   Tobacco comments:    never smoked over 1 pack   Vaping Use   Vaping Use: Never used  Substance and Sexual Activity   Alcohol use: Yes    Alcohol/week: 3.0 - 4.0 standard drinks    Types: 1 Glasses of wine, 2 - 3 Shots of liquor per week    Comment: DAILY   Drug use: No    Comment: Smoked Marijuana back in 1970s   Sexual activity: Yes  Other Topics Concern    Not on file  Social History Narrative   Married, no children   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 - returned to [employer] from [place] 2014   Enjoys bird watching, reading historical books about WWII, watching tv   Social Determinants of Radio broadcast assistant Strain: Low Risk    Difficulty of Paying Living Expenses: Not hard at [employer] Insecurity: Not on file  Transportation Needs: No Transportation Needs   Lack of Transportation (Medical): No   Lack of Transportation (Non-Medical): No  Physical Activity: Inactive  Days of Exercise per Week: 0 days   Minutes of Exercise per Session: 0 min  Stress: No Stress Concern Present   Feeling of Stress : Not at all  Social Connections: Moderately Isolated   Frequency of Communication with Friends and Family: More than three times a week   Frequency of Social Gatherings with Friends and Family: Never   Attends Religious Services: Never   Marine scientist or Organizations: No   Attends Music therapist: Never   Marital Status: Married  Human resources officer Violence: Not At Risk   Fear of Current or Ex-Partner: No   Emotionally Abused: No   Physically Abused: No   Sexually Abused: No    Outpatient Medications Prior to Visit  Medication Sig Dispense Refill   acetaminophen (TYLENOL) 325 MG tablet Take 325 mg by mouth every 8 (eight) hours as needed for moderate pain.     allopurinol (ZYLOPRIM) 300 MG tablet TAKE 1 TABLET(300 MG) BY MOUTH DAILY 90 tablet 0   amiodarone (PACERONE) 200 MG tablet Take 1 tablet (200 mg total) by mouth daily. 90 tablet 1   B COMPLEX-C-FOLIC ACID PO Take 1 tablet by mouth daily.     Blood Glucose Monitoring Suppl (ONETOUCH VERIO) w/Device KIT Use 1-4 times daily as needed/directed DX E11.9 1 kit 0   divalproex (DEPAKOTE SPRINKLE) 125 MG capsule Take 125 mg by mouth 3 (three) times daily.     DULoxetine (CYMBALTA) 60 MG capsule TAKE 1 CAPSULE(60 MG) BY MOUTH  DAILY 90 capsule 1   ELIQUIS 5 MG TABS tablet TAKE 1 TABLET(5 MG) BY MOUTH TWICE DAILY (Patient taking differently: Take 5 mg by mouth 2 (two) times daily.) 180 tablet 1   gabapentin (NEURONTIN) 300 MG capsule TAKE 2 CAPSULES BY MOUTH AT NIGHT AS NEEDED FOR RESTLESS LEGS OR SYMPTOMS (Patient taking differently: Take 600 mg by mouth at bedtime as needed (restless legs or symptoms).) 180 capsule 3   HM LIDOCAINE PATCH EX Apply 1 patch topically as needed (pain).     Insulin Pen Needle (NOVOFINE PEN NEEDLE) 32G X 6 MM MISC Use 1-4 times daily as needed for insulin 100 each 3   JANUVIA 100 MG tablet TAKE 1 TABLET(100 MG) BY MOUTH DAILY 90 tablet 0   JARDIANCE 25 MG TABS tablet TAKE 1 TABLET(25 MG) BY MOUTH DAILY BEFORE AND BREAKFAST (Patient taking differently: Take 25 mg by mouth daily.) 90 tablet 1   Lancets (ONETOUCH ULTRASOFT) lancets Use 1-4 times daily as needed/directed  DX E11.9 200 each 12   Magnesium Oxide 400 MG CAPS Take 1 capsule (400 mg total) by mouth daily. 90 capsule 1   metFORMIN (GLUCOPHAGE-XR) 500 MG 24 hr tablet Take 1 tablet (500 mg total) by mouth in the morning and at bedtime. 180 tablet 3   montelukast (SINGULAIR) 10 MG tablet TAKE 1 TABLET BY MOUTH AT BEDTIME 90 tablet 1   Multiple Vitamin (MULTIVITAMIN WITH MINERALS) TABS tablet Take 1 tablet by mouth daily.     ONETOUCH VERIO test strip Use 1-4 times daily as directed/needed   DX E11.9 200 each 12   rosuvastatin (CRESTOR) 40 MG tablet TAKE 1 TABLET(40 MG) BY MOUTH DAILY 90 tablet 0   thiamine (VITAMIN B-1) 100 MG tablet Take 500 mg by mouth daily.     TOUJEO SOLOSTAR 300 UNIT/ML Solostar Pen Inject 80 Units into the skin at bedtime.     TRELEGY ELLIPTA 100-62.5-25 MCG/ACT AEPB INHALE 1 PUFF INTO THE LUNGS DAILY 60  each 3   metoprolol tartrate (LOPRESSOR) 50 MG tablet Take 1 tablet (50 mg total) by mouth 2 (two) times daily.     No facility-administered medications prior to visit.    Allergies  Allergen Reactions    Xarelto [Rivaroxaban] Other (See Comments)    Patient stated he "ended up in the hospital with a rectal bleed"   Amoxicillin Rash and Other (See Comments)    RASH IN GROIN AREA Has patient had a PCN reaction causing immediate rash, facial/tongue/throat swelling, SOB or lightheadedness with hypotension: no Has patient had a PCN reaction causing severe rash involving mucus membranes or skin necrosis: no Has patient had a PCN reaction that required hospitalization: no Has patient had a PCN reaction occurring within the last 10 years: yes If all of the above answers are "NO", then may proceed with Cephalosporin use.    Augmentin [Amoxicillin-Pot Clavulanate] Rash and Other (See Comments)    RASH IN GROIN AREA   Azithromycin Rash and Other (See Comments)    RASH IN GROIN AREA   Clindamycin/Lincomycin Rash   Keflex [Cephalexin] Rash    ROS Review of Systems  Constitutional:  Negative for appetite change, chills, fatigue and fever.  Respiratory:  Positive for cough. Negative for stridor.   Cardiovascular:  Negative for chest pain and palpitations.  Gastrointestinal:  Negative for abdominal pain.  Genitourinary:  Negative for dysuria.     Objective:    Physical Exam Vitals reviewed.  Cardiovascular:     Rate and Rhythm: Normal rate.  Pulmonary:     Effort: Pulmonary effort is normal. No respiratory distress.     Breath sounds: No rales.  Musculoskeletal:     Right lower leg: No edema.     Left lower leg: No edema.  Neurological:     Mental Status: He is alert and oriented to person, place, and time.    BP (!) 150/80 (BP Location: Left Arm, Patient Position: Sitting, Cuff Size: Normal)    Pulse 80    Temp 97.8 F (36.6 C) (Oral)    Wt 239 lb 9.6 oz (108.7 kg)    SpO2 97%    BMI 34.38 kg/m  Wt Readings from Last 3 Encounters:  10/14/21 239 lb 9.6 oz (108.7 kg)  08/10/21 238 lb (108 kg)  08/05/21 238 lb 9.6 oz (108.2 kg)     Health Maintenance Due  Topic Date Due    TETANUS/TDAP  10/07/2019   FOOT EXAM  09/23/2020   COVID-19 Vaccine (4 - Booster for Pfizer series) 11/23/2020   OPHTHALMOLOGY EXAM  09/01/2021    There are no preventive care reminders to display for this patient.  Lab Results  Component Value Date   TSH 1.984 07/30/2021   Lab Results  Component Value Date   WBC 6.8 08/05/2021   HGB 13.7 08/05/2021   HCT 40.6 08/05/2021   MCV 99.5 08/05/2021   PLT 283.0 08/05/2021   Lab Results  Component Value Date   NA 140 08/05/2021   K 3.8 08/05/2021   CO2 28 08/05/2021   GLUCOSE 178 (H) 08/05/2021   BUN 10 08/05/2021   CREATININE 1.06 08/05/2021   BILITOT 0.9 07/30/2021   ALKPHOS 48 07/30/2021   AST 28 07/30/2021   ALT 16 07/30/2021   PROT 6.9 07/30/2021   ALBUMIN 3.9 07/30/2021   CALCIUM 9.8 08/05/2021   ANIONGAP 7 08/02/2021   EGFR 96 12/08/2020   GFR 70.80 08/05/2021   Lab Results  Component Value Date  CHOL 137 09/23/2020   Lab Results  Component Value Date   HDL 40 09/23/2020   Lab Results  Component Value Date   LDLCALC 64 09/23/2020   Lab Results  Component Value Date   TRIG 280 (H) 09/23/2020   Lab Results  Component Value Date   CHOLHDL 3.4 09/23/2020   Lab Results  Component Value Date   HGBA1C 6.7 (A) 10/14/2021      Assessment & Plan:   #1 longstanding history of alcohol abuse.  Wernicke's encephalopathy.  Bulbar palsy.  Recent neurologic evaluation and no evidence for significant dementia.  Patient refuses to quit drinking or even scaled back at this time.  We did discuss alcohol rehab but at this point he is refusing to make changes.  He is aware that with ongoing drinking he will continue to decline neurologically and otherwise  #2 persistent cough.  No respiratory distress currently.  At very high risk for recurrent aspiration related to his bulbar palsy with likely some recurrent silent aspiration.  Recent swallowing study showed some oropharyngeal phase dysphagia.  Does have history of  COPD-currently untreated with any inhalers..  Currently not taking his Trelegy  -DuoNeb nebulizer given in office -Depo-Medrol 80 mg IM -We have recommended follow-up with pulmonary to discuss further maintenance therapy  #3 type 2 diabetes.  Remains fairly well controlled with A1c today 6.7%.  Continue current diabetic regimen  #4 severe plaque psoriasis.  Patient on Skyrizi.  He is followed by dermatology   No orders of the defined types were placed in this encounter.   Follow-up: No follow-ups on file.    Carolann Littler, MD

## 2021-10-15 ENCOUNTER — Telehealth: Payer: Self-pay | Admitting: Neurology

## 2021-10-15 NOTE — Telephone Encounter (Signed)
Spoke with [name] (Patient spouse), recommended alcohol cessation. He needs to refrain from alcohol: no scotch, no wine, no beer, no liquor,  to stop the progression of his encephalopathy, and bulbar palsy and other complication of chronic alcohol abuse. I have explained to her that alcohol is toxic to the brain and nerve and the first step if for alcohol cessation prior to sending him for NPH evaluation and treatment by neurosurgery. Wife has told that he did rehab and was sober for 7 months but restarted drinking once he got home. She voices understanding and will discuss with Kaynen to have him check back in to rehab.

## 2021-10-15 NOTE — Telephone Encounter (Signed)
Pt's wife, Ardit Danh (on [place]) have questions about fluid drain from the brain.

## 2021-10-15 NOTE — Telephone Encounter (Signed)
I spoke to the patient's wife. Says he is no longer drinking scotch but still drinking approximately one bottle of wine per day. She would like to speak to Dr. April Manson about his treatment. Says he is refusing rehab and complete cessation from alcohol use.

## 2021-10-16 ENCOUNTER — Other Ambulatory Visit (HOSPITAL_COMMUNITY): Payer: Self-pay | Admitting: Nurse Practitioner

## 2021-10-24 ENCOUNTER — Other Ambulatory Visit: Payer: Self-pay | Admitting: Family Medicine

## 2021-10-26 ENCOUNTER — Ambulatory Visit (INDEPENDENT_AMBULATORY_CARE_PROVIDER_SITE_OTHER): Payer: Medicare Other

## 2021-10-26 DIAGNOSIS — I480 Paroxysmal atrial fibrillation: Secondary | ICD-10-CM

## 2021-10-26 LAB — CUP PACEART REMOTE DEVICE CHECK
Date Time Interrogation Session: 20230122234206
Implantable Pulse Generator Implant Date: 20200928

## 2021-10-26 NOTE — Telephone Encounter (Signed)
Please advise. Last filled by a historical provider. Ok to fill?

## 2021-10-26 NOTE — Telephone Encounter (Signed)
Last filled by a historical provider. Ok to fill?

## 2021-10-28 ENCOUNTER — Other Ambulatory Visit: Payer: Self-pay | Admitting: Family Medicine

## 2021-10-28 NOTE — Telephone Encounter (Signed)
Last filled by a historical provider. Ok to fill?

## 2021-11-02 NOTE — Telephone Encounter (Signed)
I spoke to the patient and his wife. Per the patient, he is still drinking 8 ounces of scotch daily and 15 ounces of wine daily.  He is also having frequent headaches. Taking Tylenol 1500mg  nearly every day. I cautioned them on overusing NSAIDS. In combination with the alcohol, he can develop liver issues. He should make sure he is staying well hydrated with water and try to only medicate the moderate to severe headaches.  Unfortunately, as discussed in the past, he will need to stop drinking alcohol before further intervention by neurosurgery. I again encouraged him to seek a rehab program. He and his wife verbalized understanding.

## 2021-11-02 NOTE — Telephone Encounter (Signed)
Pt's Adam [name] (on [place]) pt would like a call from the nurse to discuss ETV. Pt have some questions.

## 2021-11-03 ENCOUNTER — Telehealth: Payer: Self-pay | Admitting: Family Medicine

## 2021-11-03 NOTE — Chronic Care Management (AMB) (Signed)
°  Chronic Care Management   Note  11/03/2021 Name: ALOYSIUS HEINLE MRN: [id number] DOB: Nov 12, 1950  [name] is a 71 y.o. year old male who is a primary care patient of [name], Alinda Sierras, MD. I reached out to [name] by phone today in response to a referral sent by Mr. Keavon Sensing Sickles's PCP, [name], MD.   Mr. Mcenroe was given information about Chronic Care Management services today including:  CCM service includes personalized support from designated clinical staff supervised by his physician, including individualized plan of care and coordination with other care providers 24/7 contact phone numbers for assistance for urgent and routine care needs. Service will only be billed when office clinical staff spend 20 minutes or more in a month to coordinate care. Only one practitioner may furnish and bill the service in a calendar month. The patient may stop CCM services at any time (effective at the end of the month) by phone call to the office staff.   [name]/ SPOUSE verbally agreed to assistance and services provided by embedded care coordination/care management team today. Tatjana Dellinger Upstream Scheduler   Follow up plan:

## 2021-11-05 NOTE — Progress Notes (Signed)
[facility] Summary Report / Loop Recorder 

## 2021-11-12 ENCOUNTER — Encounter: Payer: Self-pay | Admitting: Family Medicine

## 2021-11-13 MED ORDER — APIXABAN 5 MG PO TABS: 5.0000 mg | ORAL_TABLET | Freq: Two times a day (BID) | ORAL | 1 refills | Status: DC

## 2021-11-13 NOTE — Telephone Encounter (Signed)
Please advise. Are you able to send this electronically? When I send it, it prints.

## 2021-11-13 NOTE — Telephone Encounter (Signed)
I sent in the Eliquis

## 2021-11-21 IMAGING — CT CT HEAD W/O CM
3 series · 14 of 47 positions shown, 16 images · non-contrast
Comparison: None.

CLINICAL DATA: Post-traumatic headache.

EXAM:
CT HEAD WITHOUT CONTRAST
TECHNIQUE: Contiguous axial images were obtained from the base of the skull
through the vertex without intravenous contrast.

[Series 3: head 5.0 h30s · axial · 0.45mm/px · z∈[-103,+42]mm · 8 of 35 slices shown, 10 images]
[im 3/35  brain]
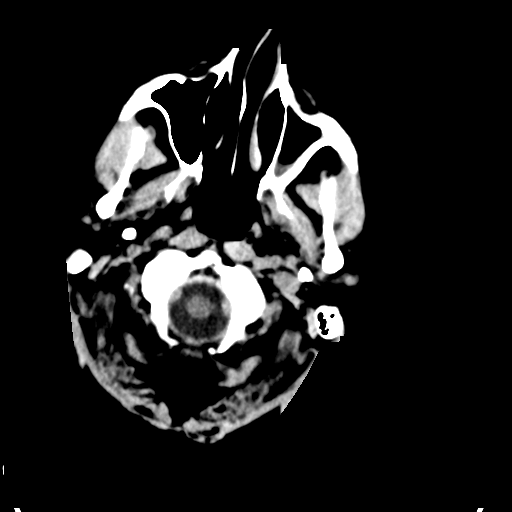
[im 3/35  bone]
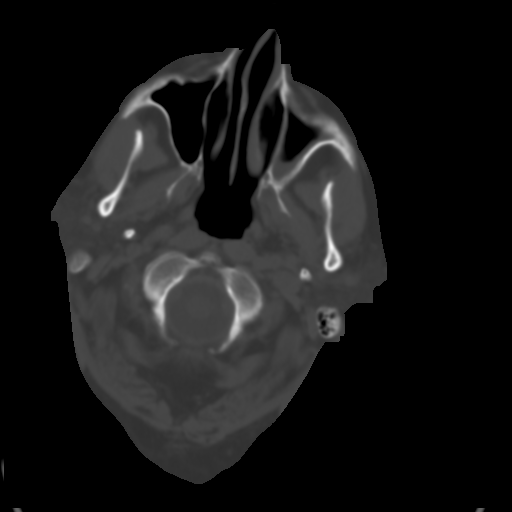
[im 8/35  brain]
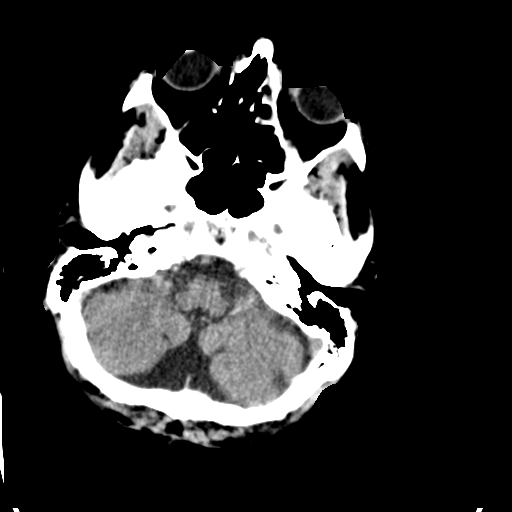
[im 11/35  brain]
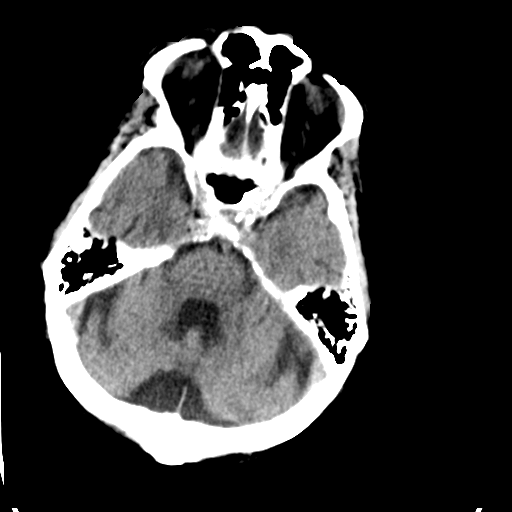
[im 16/35  brain]
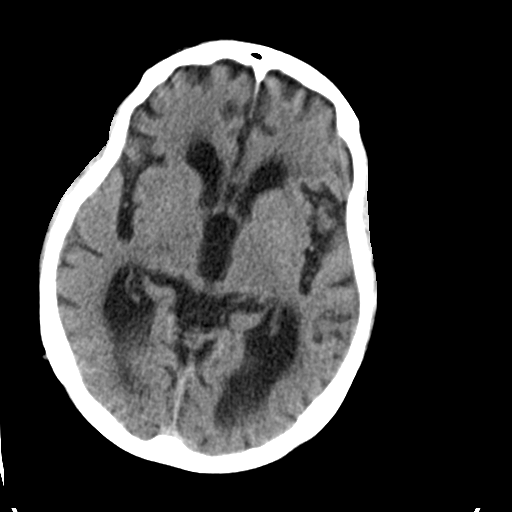
[im 19/35  brain]
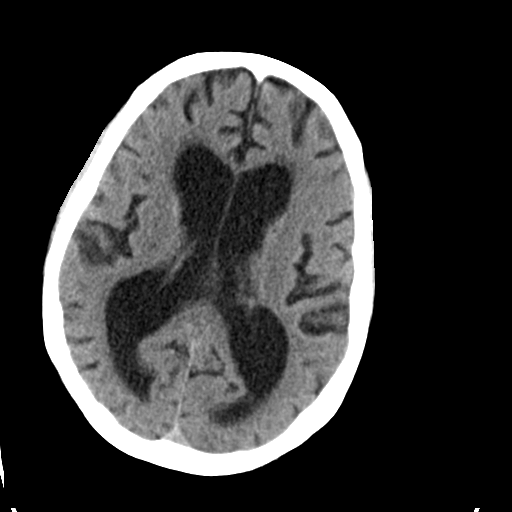
[im 19/35  bone]
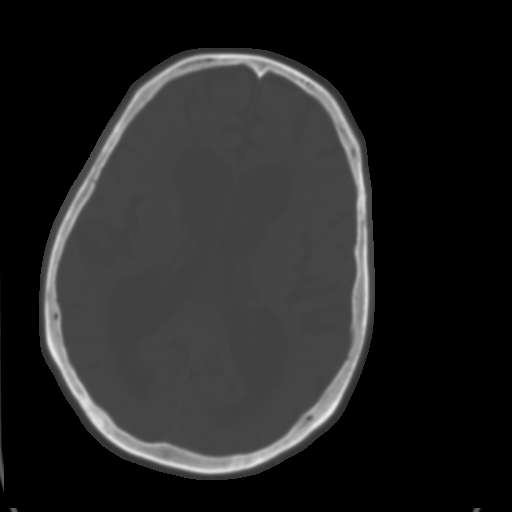
[im 24/35  brain]
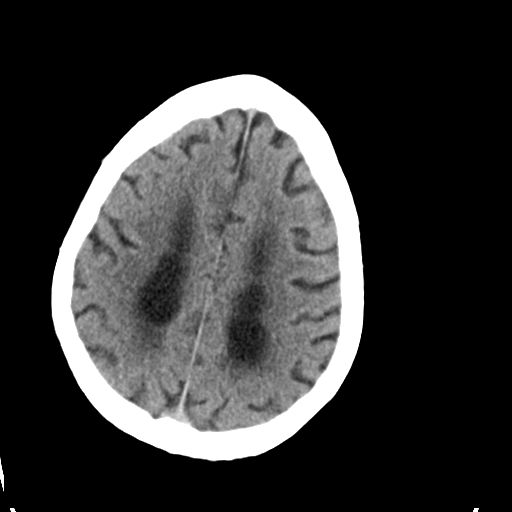
[im 27/35  brain]
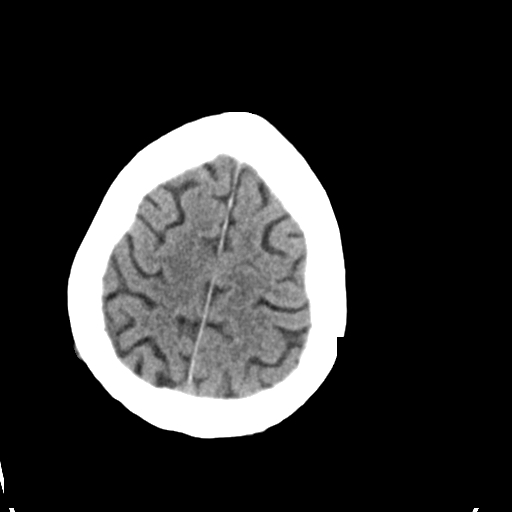
[im 32/35  brain]
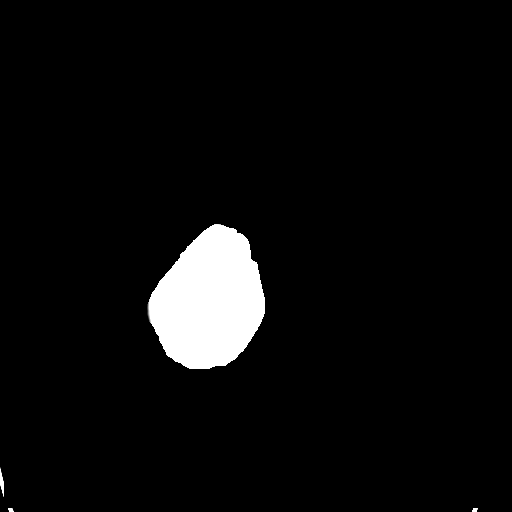

[Series 5: head 3.0 mpr cor · coronal · 0.33mm/px · 3 of 80 slices shown]
[im 27/80  brain]
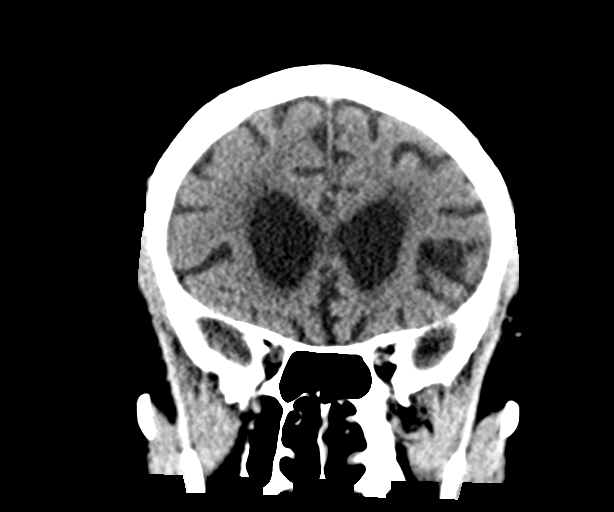
[im 36/80  brain]
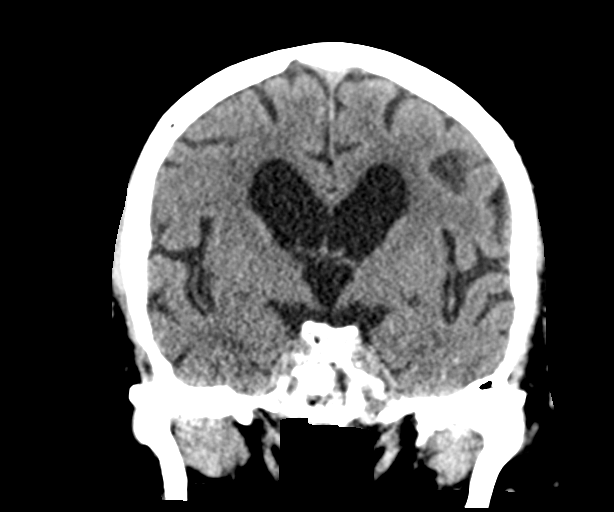
[im 44/80  brain]
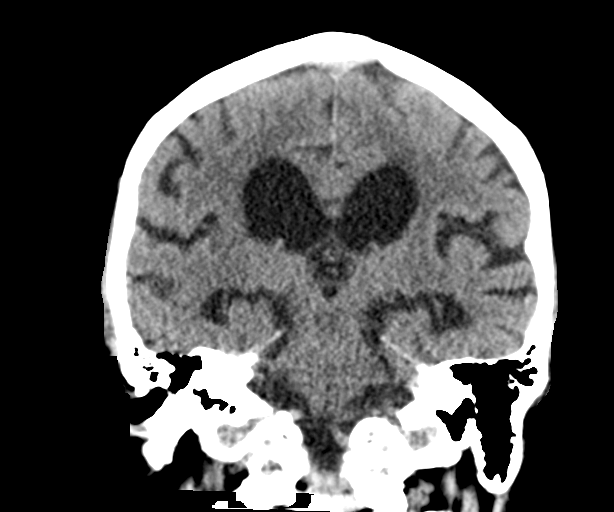

[Series 6: head 3.0 mpr sag · sagittal · 0.33mm/px · 3 of 67 slices shown]
[im 23/67  brain]
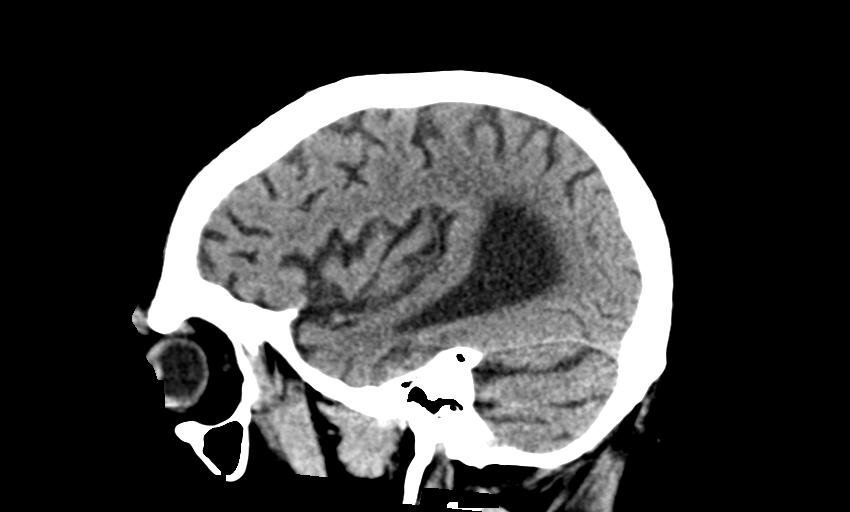
[im 34/67  brain]
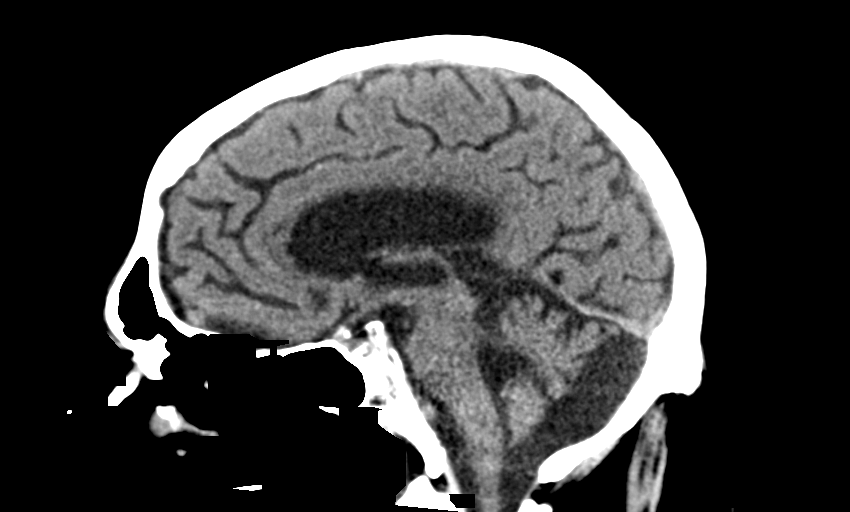
[im 45/67  brain]
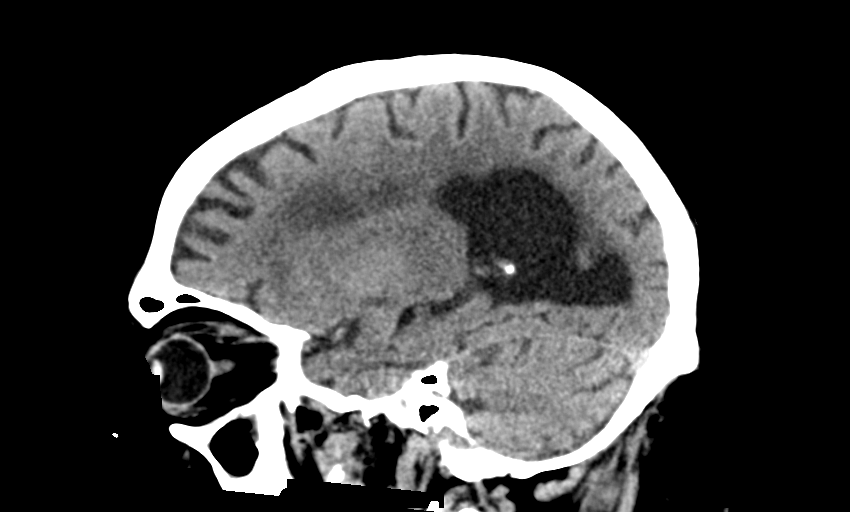

[14 of 47 positions shown; findings below may reference images not displayed]

FINDINGS: Brain: No evidence of acute infarction, hemorrhage, hydrocephalus,
extra-axial collection or mass lesion/mass effect. Advanced atrophy
and chronic microvascular ischemic changes are noted.

Vascular: No hyperdense vessel or unexpected calcification.

Skull: Normal. Negative for fracture or focal lesion.

Sinuses/Orbits: Mucosal thickening is noted of the left maxillary
sinus. The remaining paranasal sinuses and mastoid air cells are
essentially clear.

Other: None.
IMPRESSION: 1. No acute intracranial abnormality.
2. Advanced atrophy and chronic microvascular ischemic changes.

## 2021-11-27 ENCOUNTER — Other Ambulatory Visit (HOSPITAL_COMMUNITY): Payer: Self-pay | Admitting: Nurse Practitioner

## 2021-11-30 ENCOUNTER — Ambulatory Visit (INDEPENDENT_AMBULATORY_CARE_PROVIDER_SITE_OTHER): Payer: Medicare Other

## 2021-11-30 DIAGNOSIS — I480 Paroxysmal atrial fibrillation: Secondary | ICD-10-CM

## 2021-11-30 LAB — CUP PACEART REMOTE DEVICE CHECK
Date Time Interrogation Session: 20230224234142
Implantable Pulse Generator Implant Date: 20200928

## 2021-12-01 ENCOUNTER — Telehealth: Payer: Self-pay | Admitting: Emergency Medicine

## 2021-12-01 ENCOUNTER — Encounter: Payer: Self-pay | Admitting: Emergency Medicine

## 2021-12-01 ENCOUNTER — Other Ambulatory Visit: Payer: Self-pay

## 2021-12-01 ENCOUNTER — Ambulatory Visit: Payer: Medicare Other | Admitting: Emergency Medicine

## 2021-12-01 DIAGNOSIS — J441 Chronic obstructive pulmonary disease with (acute) exacerbation: Secondary | ICD-10-CM | POA: Diagnosis not present

## 2021-12-01 MED ORDER — BUDESONIDE 0.5 MG/2ML IN SUSP
0.5000 mg | Freq: Two times a day (BID) | RESPIRATORY_TRACT | 12 refills | Status: DC
Start: 2021-12-01 — End: 2022-01-27

## 2021-12-01 MED ORDER — ARFORMOTEROL TARTRATE 15 MCG/2ML IN NEBU
15.0000 ug | INHALATION_SOLUTION | Freq: Two times a day (BID) | RESPIRATORY_TRACT | 6 refills | Status: DC
Start: 2021-12-01 — End: 2022-07-29

## 2021-12-01 MED ORDER — YUPELRI 175 MCG/3ML IN SOLN: 175.0000 ug | Freq: Every day | RESPIRATORY_TRACT | 0 refills | Status: DC

## 2021-12-01 NOTE — Assessment & Plan Note (Signed)
We will not restart Trelegy at this time. We will try starting nebulized medications to help improve delivery anterior chest: -Brovana 1 nebulizer treatment twice a day. -Pulmicort (budesonide) 1 nebulizer treatment twice a day, can be mixed with the Brovana. Maretta Bees 1 nebulizer treatment once a day. Follow-up with APP in 6 weeks to discuss this routine and adjust if necessary depending on benefit and cost. Follow Dr. Lamonte Sakai in 6 months or sooner if you have any problems.

## 2021-12-01 NOTE — Patient Instructions (Addendum)
We will not restart Trelegy at this time. We will try starting nebulized medications to help improve delivery anterior chest: -Brovana 1 nebulizer treatment twice a day. -Pulmicort (budesonide) 1 nebulizer treatment twice a day, can be mixed with the Brovana. Maretta Bees 1 nebulizer treatment once a day. Follow-up with APP in 6 weeks to discuss this routine and adjust if necessary depending on benefit and cost. Follow Dr. Lamonte Sakai in 6 months or sooner if you have any problems.

## 2021-12-01 NOTE — Progress Notes (Signed)
° °  Subjective:    Patient ID: Adam [name], male    DOB: November 07, 1950, 71 y.o.   MRN: [id number]  COPD His past medical history is significant for COPD.   ROV 12/01/2020 --follow-up visit 71 year old man with history of COPD/asthma, chronic rhinitis, chronic cough.  Also with obesity, hypertension, diastolic dysfunction, CAD/CABG, A. fib.  At his last visit a year ago he had been changed to Trelegy but had stopped it.  Plan was for him to restart. He was admitted in May '21 with encephalopathy, thiamine deficiency likely due to EtOH. Had a long rehab stint.  Today he reports that his thinking is much more clear. He has not had a flare since last years He is on Trelegy and feels that it is working well - no dyspnea, no cough, no wheeze. Has albuterol but never needs it.  Remains on Eliquis and dofetilide.  Pepcid, Singulair.  ROV 12/01/21 --71 year old gentleman whom we have followed for COPD/asthma as well as chronic cough in the setting of chronic rhinitis on and GERD. PMH: Obesity, hypertension with diastolic dysfunction, CAD/CABG, atrial fibrillation, EtOH abuse I saw him a year ago at which time we had plan to continue Trelegy because it seemed that he was benefiting.  Today he reports that he has had a lot of changes since I last saw him - has been dx w Wernike's, has been progressive. Possible normal pressure hydrocephalus.  He stopped Trelegy because he has trouble inhaling it. He has more cough w mucous. More SOB.    No flowsheet data found.  Review of Systems As per hpi      Objective:   Physical Exam Vitals:   12/01/21 1129  BP: 132/80  Pulse: 75  Temp: (!) 97.5 F (36.4 C)  TempSrc: Oral  SpO2: 95%  Weight: 239 lb 12.8 oz (108.8 kg)  Height: 5\' 10"  (1.778 m)   Gen: Pleasant, obese, somewhat more lethargic than prior, more ill-appearing  ENT: No lesions,  mouth clear,  oropharynx clear, no nasal congestion  Neck: No JVD, some spontaneous upper airway noise  Lungs:  Some difficulty getting a deep breath, almost stuttering respiratory pattern.  No wheezing or crackles  Cardiovascular: RRR, heart sounds normal, no murmur or gallops, no edema  Musculoskeletal: No deformities, no cyanosis or clubbing  Neuro: Some lethargy, clear dysarthria and some spontaneous upper airway noise  Skin: Warm, no lesions or rashes      Assessment & Plan:  COPD (chronic obstructive pulmonary disease) (HCC) We will not restart Trelegy at this time. We will try starting nebulized medications to help improve delivery anterior chest: -Brovana 1 nebulizer treatment twice a day. -Pulmicort (budesonide) 1 nebulizer treatment twice a day, can be mixed with the Brovana. Maretta Bees 1 nebulizer treatment once a day. Follow-up with APP in 6 weeks to discuss this routine and adjust if necessary depending on benefit and cost. Follow Dr. Lamonte Sakai in 6 months or sooner if you have any problems.  Baltazar Apo, MD, PhD 12/01/2021, 11:48 AM [facility] [phone] or if no answer [phone]

## 2021-12-02 ENCOUNTER — Telehealth: Payer: Self-pay | Admitting: Pharmacist

## 2021-12-02 NOTE — Telephone Encounter (Signed)
Confirmed [facility] needed pt demos, insurance and OV note from yesterday to fill Yulperi order. Information was faxed nothing further needed at this time.  ?

## 2021-12-02 NOTE — Chronic Care Management (AMB) (Signed)
? ? ?Chronic Care Management ?Pharmacy Assistant  ? ?Name: Adam [name]  MRN: [id number] DOB: [birth year] ? ?Reason for Encounter: Chart Prep for initial encounter with Adam [name] Clinical Pharmacist on 12/07/21 at 11 am in office ?  ?Conditions to be addressed/monitored: ?Atrial Fibrillation, CAD, HTN, HLD, COPD, DMII, GERD, and Asthma ? 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 No medication changes. ? ?08/05/21 Adam Post, MD - Patient presented for cough and other concerns. No medication changes. ? ?06/24/21 Adam Post, MD - Patient presented for Slurred speech and other concerns. No medication changes. ? ?Recent consult visits:  ?12/01/21 Adam Gobble, MD (Pulmonology) - Patient presented for COPD with acute exacerbation. Prescribed Arformoterol Tartrate, Budesonide &  Revefenacin ? ?09/24/21 Patient presented to [facility] for Modified Barium Swallow. ? ?08/10/21 Adam Ran, MD (Neurology) - Patient presented for Alcohol abuse and other concerns. No medication changes. ? ?07/30/21 Adam Needs, NP (Cardiology) - Patient presented for Paroxysmal Atrial fibrillation and other concerns. No medication changes. ? ?06/11/21 Patient presented for Colonoscopy ? ?Hospital visits:  ?Medication Reconciliation was completed by comparing discharge summary, patient?s EMR and Pharmacy list, and upon discussion with patient. ? ?Patient presented to [facility] on 07/30/21 due to Hypokalemia. Patient was present for 3 days. ? ?New?Medications Started at [facility] Discharge:?? ?-started  ?none ? ?Medication Changes at Hospital Discharge: ?-Changed  ?none ? ?Medications Discontinued at Hospital Discharge: ?-Stopped  ?none ? ?Medications that remain the same after Hospital Discharge:??  ?-All other medications will remain the same.    ? ?Medications: ?Outpatient Encounter Medications as of 12/02/2021  ?Medication Sig  ? acetaminophen (TYLENOL) 325 MG tablet Take 325 mg by mouth every 8 (eight) hours as needed for moderate pain.  ? allopurinol (ZYLOPRIM) 300 MG tablet TAKE 1 TABLET(300 MG) BY MOUTH DAILY  ? amiodarone (PACERONE) 200 MG tablet TAKE 1 TABLET(200 MG) BY MOUTH DAILY  ? apixaban (ELIQUIS) 5 MG TABS tablet Take 1 tablet (5 mg total) by mouth 2 (two) times daily. TAKE 1 TABLET(5 MG) BY MOUTH TWICE DAILY Strength: 5 mg  ? arformoterol (BROVANA) 15 MCG/2ML NEBU Take 2 mLs (15 mcg total) by nebulization 2 (two) times daily.  ? B COMPLEX-C-FOLIC ACID PO Take 1 tablet by mouth daily.  ? Blood Glucose Monitoring Suppl (ONETOUCH VERIO) w/Device KIT Use 1-4 times daily as needed/directed DX E11.9  ? budesonide (PULMICORT) 0.5 MG/2ML nebulizer solution Take 2 mLs (0.5 mg total) by nebulization in the morning and at bedtime.  ? divalproex (DEPAKOTE SPRINKLE) 125 MG capsule Take 125 mg by mouth 3 (three) times daily.  ? DULoxetine (CYMBALTA) 60 MG capsule TAKE 1 CAPSULE(60 MG) BY MOUTH DAILY  ? gabapentin (NEURONTIN) 300 MG capsule TAKE 2 CAPSULES BY MOUTH AT NIGHT AS NEEDED FOR RESTLESS LEGS OR SYMPTOMS (Patient taking differently: Take 600 mg by mouth at bedtime as needed (restless legs or symptoms).)  ? HM LIDOCAINE PATCH EX Apply 1 patch topically as needed (pain).  ? Insulin Pen Needle (NOVOFINE PEN NEEDLE) 32G X 6 MM MISC Use 1-4 times daily as needed for insulin  ? JANUVIA 100 MG tablet TAKE 1 TABLET(100 MG) BY MOUTH DAILY  ? JARDIANCE 25 MG TABS tablet TAKE 1 TABLET(25 MG) BY MOUTH DAILY BEFORE AND BREAKFAST (Patient taking differently: Take 25 mg by mouth daily.)  ? Lancets [facility]  ULTRASOFT) lancets Use 1-4 times daily as needed/directed  DX E11.9  ? Magnesium Oxide 400 MG CAPS Take 1 capsule (400 mg total) by mouth daily.  ? metFORMIN (GLUCOPHAGE-XR) 500 MG 24 hr tablet Take 1 tablet (500 mg total) by mouth in the morning and at  bedtime.  ? metoprolol tartrate (LOPRESSOR) 50 MG tablet TAKE 1 TABLET(50 MG) BY MOUTH TWICE DAILY  ? montelukast (SINGULAIR) 10 MG tablet TAKE 1 TABLET BY MOUTH AT BEDTIME (Patient not taking: Reported on 12/01/2021)  ? Multiple Vitamin (MULTIVITAMIN WITH MINERALS) TABS tablet Take 1 tablet by mouth daily.  ? ONETOUCH VERIO test strip Use 1-4 times daily as directed/needed   DX E11.9  ? revefenacin (YUPELRI) 175 MCG/3ML nebulizer solution Take 3 mLs (175 mcg total) by nebulization daily.  ? rosuvastatin (CRESTOR) 40 MG tablet TAKE 1 TABLET(40 MG) BY MOUTH DAILY  ? SKYRIZI PEN 150 MG/ML SOAJ Inject into the skin.  ? thiamine (VITAMIN B-1) 100 MG tablet Take 500 mg by mouth daily.  ? TOUJEO SOLOSTAR 300 UNIT/ML Solostar Pen INJECT 60 UNITS INTO THE SKIN EVERY DAY  ? TRELEGY ELLIPTA 100-62.5-25 MCG/ACT AEPB INHALE 1 PUFF INTO THE LUNGS DAILY (Patient not taking: Reported on 12/01/2021)  ? ?No facility-administered encounter medications on file as of 12/02/2021.  ?Fill History : ?ALLOPURINOL 300MG TABLETS 08/29/2021 90  ? ?AMIODARONE 200MG TABLETS 10/16/2021 90  ? ?ELIQUIS 5MG TABLETS 11/13/2021 90  ? ?DIVALPROEX 125MG SPRINKLES 07/20/2021 90  ? ?DULOXETINE DR 60MG CAPSULES 09/10/2021 90  ? ?GABAPENTIN 300MG CAPSULES 10/16/2021 45  ? ?NOVOFINE 32GX6MM PEN NEEDLES 100'S 09/17/2020 25  ? ?JANUVIA 100MG TABLETS 09/04/2021 90  ? ?JARDIANCE 25MG TABLETS 06/02/2021 90  ? ?ONE TOUCH DELICA PLUS 93T LANCETS 09/30/2020 90  ? ?MAGNESIUM OXIDE 400MG TABLETS 12/04/2020 90  ? ?METFORMIN ER 500MG 24HR TABS 03/04/2021 90  ? ?METOPROLOL TARTRATE 50 MG TABS 11/30/2021 90  ? ?MONTELUKAST 10MG TABLETS 09/04/2021 90  ? ?ROSUVASTATIN 40MG TABLETS 08/24/2021 90  ? ?SKYRIZI PEN 150 MG/ML SOAJ 10/20/2021 84  ? ?TOUJEO SOLOSTAR 300 UNIT/ML SOPN 10/26/2021 57  ? ?TRELEGY ELLIPTA 100-62.5MCG INH 30P 09/29/2021 30  ? ?Spoke with wife during call ? ?Have you seen any other providers since your last visit? Wife reports none ? ?Any changes in your  medications or health? Wife reports no changes ? ?Any side effects from any medications? Wife reports he complains of constipation but not much else. ? ?Do you have an symptoms or problems not managed by your medications? Wife reports no ? ?Any concerns about your health right now? Wife reports he is always down and his balance is off. ? ?Has your provider asked that you check blood pressure, blood sugar, or follow special diet at home? Wife reports he has an arm bp machine and can check his pressure . She reports he also has a kit for his sugars but is not checking. ? ?Do you get any type of exercise on a regular basis? Wife reports he is not using any assisted devices for mobility and does not do any physical activity at all. ? ?Can you think of a goal you would like to reach for your health? Wife reports balance and feeling down would like to see improved. ? ?Do you have any problems getting your medications? Wife reports his medications for the past year with [facility] were no cost to them but they are not great at always filling on time. ? ?Is there anything that you would like to discuss  during the appointment? Wife reports not at this time. ? ? ?Reminded wife to bring bp machine, medications and supplements to appointment. ? ? ? ? ?Care Gaps: ?TDAP - Overdue ?Foot Exam - Overdue ?COVID Booster - Overdue ?Eye Exam - Overdue ?BP- 132/80 ( 12/01/21) ?AWV- 3/22 ?Lab Results  ?Component Value Date  ? HGBA1C 6.7 (A) 10/14/2021  ? ? ?Star Rating Drugs: ?Sitagliptin Phosphate 100 mg - Last filled 09/04/21 90 DS at [facility] ?Empagliflozin 25 mg - Last filled 06/02/21 90 DS at [facility] ?Metformin 500 mg - Last filled 03/04/21 90 DS at [facility] ?Rosuvastatin 40 mg - Last filled 08/24/21 90 DS at [facility] ? ? ? ?Ned Clines CMA ?Clinical Pharmacist Assistant ?[phone] ? ?

## 2021-12-03 DIAGNOSIS — I4891 Unspecified atrial fibrillation: Secondary | ICD-10-CM | POA: Diagnosis not present

## 2021-12-03 IMAGING — DX DG CHEST 1V PORT
1 series · 1 of 1 positions shown · non-contrast
Comparison: 02/08/2020

CLINICAL DATA: Tachycardia

EXAM:
PORTABLE CHEST 1 VIEW

[chest ap]
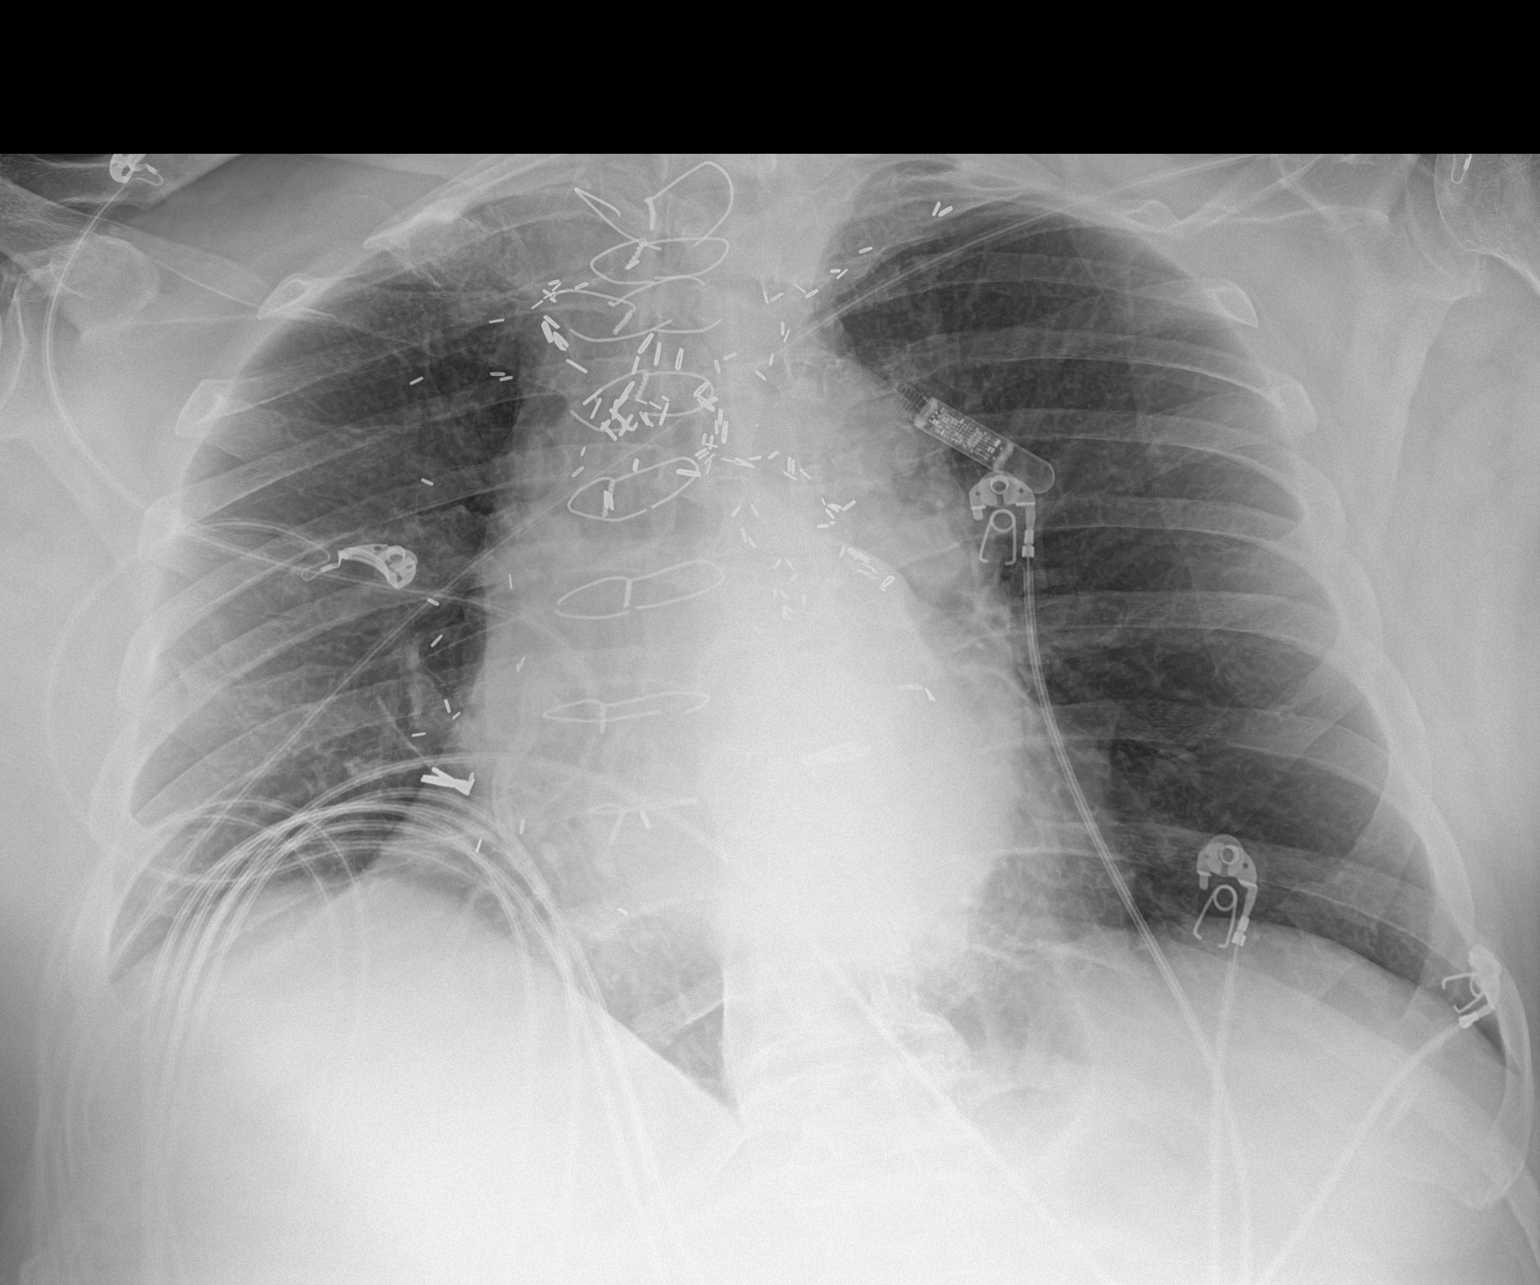

[1 of 1 positions shown; findings below may reference images not displayed]

FINDINGS: Single frontal view of the chest demonstrates stable cardiac
silhouette. Postsurgical changes from median sternotomy. Loop
recorder again seen left anterior chest. No airspace disease,
effusion, or pneumothorax. No acute bony abnormalities.
IMPRESSION: 1. No acute intrathoracic process.

## 2021-12-03 NOTE — Progress Notes (Signed)
[facility] Summary Report / Loop Recorder 

## 2021-12-07 ENCOUNTER — Ambulatory Visit (INDEPENDENT_AMBULATORY_CARE_PROVIDER_SITE_OTHER): Payer: Medicare Other | Admitting: Pharmacist

## 2021-12-07 DIAGNOSIS — I4891 Unspecified atrial fibrillation: Secondary | ICD-10-CM

## 2021-12-07 DIAGNOSIS — E1142 Type 2 diabetes mellitus with diabetic polyneuropathy: Secondary | ICD-10-CM

## 2021-12-07 NOTE — Progress Notes (Unsigned)
Chronic Care Management Pharmacy Note  12/07/2021 Name:  Adam [name] MRN:  [id number] DOB:  1951-02-20  Summary: ***  Recommendations/Changes made from today's visit: ***  Plan: ***   Subjective: Adam [name] is an 71 y.o. year old male who is a primary patient of [name], Alinda Sierras, MD.  The CCM team was consulted for assistance with disease management and care coordination needs.    Engaged with patient face to face for initial visit in response to provider referral for pharmacy case management and/or care coordination services.   Consent to Services:  The patient was given the following information about Chronic Care Management services today, agreed to services, and gave verbal consent: 1. CCM service includes personalized support from designated clinical staff supervised by the primary care provider, including individualized plan of care and coordination with other care providers 2. 24/7 contact phone numbers for assistance for urgent and routine care needs. 3. Service will only be billed when office clinical staff spend 20 minutes or more in a month to coordinate care. 4. Only one practitioner may furnish and bill the service in a calendar month. 5.The patient may stop CCM services at any time (effective at the end of the month) by phone call to the office staff. 6. The patient will be responsible for cost sharing (co-pay) of up to 20% of the service fee (after annual deductible is met). Patient agreed to services and consent obtained.  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 No medication changes.   08/05/21 [name], Alinda Sierras, MD - Patient presented for cough and other concerns. No medication changes.   06/24/21 [name], Alinda Sierras, MD - Patient presented for Slurred speech and other concerns. No medication changes  Recent consult visits: 12/01/21 [name], MD (Pulmonology) - Patient presented for COPD with acute exacerbation. Prescribed Arformoterol Tartrate, Budesonide &  Revefenacin   09/24/21 Patient presented to [facility] for Modified Barium Swallow.   08/10/21 [name], MD (Neurology) - Patient presented for Alcohol abuse and other concerns. No medication changes.   07/30/21 [name], NP (Cardiology) - Patient presented for Paroxysmal Atrial fibrillation and other concerns. No medication changes.   06/11/21 Patient presented for Colonoscopy.  Hospital visits: Medication Reconciliation was completed by comparing discharge summary, patients EMR and Pharmacy list, and upon discussion with patient.   Patient presented to [facility] on 07/30/21 due to Hypokalemia. Patient was present for 3 days.   New?Medications Started at [facility] Discharge:?? -started  none   Medication Changes at Hospital Discharge: -Changed  none   Medications Discontinued at Hospital Discharge: -Stopped  none   Medications that remain the same after Hospital Discharge:??  -All other medications will remain the same.    Objective:  Lab Results  Component Value Date   CREATININE 1.06 08/05/2021   BUN 10 08/05/2021   GFR 70.80 08/05/2021   EGFR 96 12/08/2020   GFRNONAA >60 08/02/2021   GFRAA 115 08/22/2020   NA 140 08/05/2021   K 3.8  08/05/2021   CALCIUM 9.8 08/05/2021   CO2 28 08/05/2021   GLUCOSE 178 (H) 08/05/2021    Lab Results  Component Value Date/Time   HGBA1C 6.7 (A) 10/14/2021 10:37 AM   HGBA1C  6.4 (A) 06/24/2021 12:01 PM   HGBA1C 5.7 (H) 03/20/2021 04:50 PM   HGBA1C 6.4 12/22/2020 11:16 AM   HGBA1C 10.1 (H) 09/23/2020 12:16 PM   GFR 70.80 08/05/2021 03:57 PM   GFR 75.74 12/03/2019 03:02 PM   MICROALBUR 2.6 (H) 06/22/2016 11:05 AM   MICROALBUR 0.7 06/12/2015 10:08 AM    Last diabetic Eye exam:  Lab Results  Component Value Date/Time   HMDIABEYEEXA No Retinopathy 09/01/2020 12:00 AM    Last diabetic Foot exam: No results found for: HMDIABFOOTEX   Lab Results  Component Value Date   CHOL 137 09/23/2020   HDL 40 09/23/2020   LDLCALC 64 09/23/2020   LDLDIRECT 124.0 06/23/2018   TRIG 280 (H) 09/23/2020   CHOLHDL 3.4 09/23/2020    Hepatic Function Latest Ref Rng & Units 07/30/2021 07/30/2021 04/05/2021  Total Protein 6.5 - 8.1 g/dL 6.9 6.9 7.0  Albumin 3.5 - 5.0 g/dL 3.9 3.4(L) 3.7  AST 15 - 41 U/L 28 39 63(H)  ALT 0 - 44 U/L 16 22 68(H)  Alk Phosphatase 38 - 126 U/L 48 49 42  Total Bilirubin 0.3 - 1.2 mg/dL 0.9 1.1 1.3(H)  Bilirubin, Direct 0.0 - 0.2 mg/dL - - -    Lab Results  Component Value Date/Time   TSH 1.984 07/30/2021 01:40 PM   TSH 2.034 03/20/2021 04:50 PM   TSH 1.32 12/29/2018 11:01 AM   TSH 2.87 06/15/2017 10:05 AM    CBC Latest Ref Rng & Units 08/05/2021 08/01/2021 07/30/2021  WBC 4.0 - 10.5 K/uL 6.8 5.7 6.5  Hemoglobin 13.0 - 17.0 g/dL 13.7 11.9(L) 13.0  Hematocrit 39.0 - 52.0 % 40.6 34.4(L) 37.3(L)  Platelets 150.0 - 400.0 K/uL 283.0 187 218    No results found for: VD25OH  Clinical ASCVD: {YES/NO:21197} The 10-year ASCVD risk score (Arnett DK, et al., 2019) is: 37.5%   Values used to calculate the score:     Age: 28 years     Sex: Male     Is Non-Hispanic African American: No     Diabetic: Yes     Tobacco smoker: No     Systolic Blood Pressure: 662 mmHg     Is BP treated: Yes     HDL Cholesterol: 40 mg/dL     Total Cholesterol: 137 mg/dL    Depression screen [facility] 2/9 12/24/2020 11/20/2019 06/26/2018  Decreased Interest 0 2 0  Down,  Depressed, Hopeless 0 2 0  PHQ - 2 Score 0 4 0  Altered sleeping - 1 -  Tired, decreased energy - 2 -  Change in appetite - 0 -  Feeling bad or failure about yourself  - 0 -  Trouble concentrating - 0 -  Moving slowly or fidgety/restless - 0 -  Suicidal thoughts - 0 -  PHQ-9 Score - 7 -  Some recent data might be hidden     ***Other: (CHADS2VASc if Afib, MMRC or CAT for COPD, ACT, DEXA)  Social History   Tobacco Use  Smoking Status Former   Packs/day: 1.00   Years: 7.00   Pack years: 7.00   Types: Cigarettes   Quit date: 10/04/1977   Years since quitting: 44.2  Smokeless Tobacco Never  Tobacco Comments   never smoked over 1 pack  BP Readings from Last 3 Encounters:  12/01/21 132/80  10/14/21 (!) 150/80  08/10/21 (!) 156/80   Pulse Readings from Last 3 Encounters:  12/01/21 75  10/14/21 80  08/05/21 87   Wt Readings from Last 3 Encounters:  12/01/21 239 lb 12.8 oz (108.8 kg)  10/14/21 239 lb 9.6 oz (108.7 kg)  08/10/21 238 lb (108 kg)   BMI Readings from Last 3 Encounters:  12/01/21 34.41 kg/m  10/14/21 34.38 kg/m  08/10/21 34.15 kg/m    Assessment/Interventions: Review of patient past medical history, allergies, medications, health status, including review of consultants reports, laboratory and other test data, was performed as part of comprehensive evaluation and provision of chronic care management services.   SDOH:  (Social Determinants of Health) assessments and interventions performed: {yes/no:20286}  SDOH Screenings   Alcohol Screen: Medium Risk   Last Alcohol Screening Score (AUDIT): 11  Depression (PHQ2-9): Low Risk    PHQ-2 Score: 0  Financial Resource Strain: Low Risk    Difficulty of Paying Living Expenses: Not hard at all  Food Insecurity: Not on file  Housing: [place] Risk Score: 0  Physical Activity: Inactive   Days of Exercise per Week: 0 days   Minutes of Exercise per Session: 0 min  Social Connections: Moderately  Isolated   Frequency of Communication with Friends and Family: More than three times a week   Frequency of Social Gatherings with Friends and Family: Never   Attends Religious Services: Never   Marine scientist or Organizations: No   Attends Music therapist: Never   Marital Status: Married  Stress: No Stress Concern Present   Feeling of Stress : Not at all  Tobacco Use: Medium Risk   Smoking Tobacco Use: Former   Smokeless Tobacco Use: Never   Passive Exposure: Not on Pensions consultant Needs: No Transportation Needs   Lack of Transportation (Medical): No   Lack of Transportation (Non-Medical): No    Wife reports he complains of constipation but not much else.  Wife reports he is always down and his balance is off.  Wife reports he has an arm bp machine and can check his pressure . She reports he also has a kit for his sugars but is not checking.  Wife reports he is not using any assisted devices for mobility and does not do any physical activity at all.  Wife reports his medications for the past year with [facility] were no cost to them but they are not great at always filling on time.  Patient sleeps a lot - trouble sleeping at night but sleeps during the day; stays on ipad with videos during the night - not always going to bed around the bed time  Patient - 2 meals a day;  morning - cereal, eggs and bacon, raspberries (3 oclock in the morning) sometimes; cold cuts sandwich (lunch/dinner - 2-3 pm), dinner - wife does the cooking; snacking before and after dinner - wine in the morning, and scottch at night, water, gaterade zero, no sodas, coffee - sometimes and caffeinated;  picks on cookies and pie sometimes - not regularly, snacking regularly  Exercise - not enjoying it, lost balance, 3 months to start walking again - rehab; no falls lately - not withinyear, feels off balance; doesn't want to do it, in chair and in bed - not moving around, watching  birds  Patient's wife is taking care of an elderyly lady in hospice  Patint  has a bad sore throat  Medications - hasn't started nebulizer medications yet; shortness of breath; had to pay for one of them; no copay for any of them at pharmacy - medicaid was approved  Patient   CCM Care Plan  Allergies  Allergen Reactions   Xarelto [Rivaroxaban] Other (See Comments)    Patient stated he "ended up in the hospital with a rectal bleed"   Amoxicillin Rash and Other (See Comments)    RASH IN GROIN AREA Has patient had a PCN reaction causing immediate rash, facial/tongue/throat swelling, SOB or lightheadedness with hypotension: no Has patient had a PCN reaction causing severe rash involving mucus membranes or skin necrosis: no Has patient had a PCN reaction that required hospitalization: no Has patient had a PCN reaction occurring within the last 10 years: yes If all of the above answers are "NO", then may proceed with Cephalosporin use.    Augmentin [Amoxicillin-Pot Clavulanate] Rash and Other (See Comments)    RASH IN GROIN AREA   Azithromycin Rash and Other (See Comments)    RASH IN GROIN AREA   Clindamycin/Lincomycin Rash   Keflex [Cephalexin] Rash    Medications Reviewed Today     Reviewed by [name], MD (Physician) on 12/01/21 at 1141  Med List Status: <None>   Medication Order Taking? Sig Documenting Provider Last Dose Status Informant  acetaminophen (TYLENOL) 325 MG tablet [id number] Yes Take 325 mg by mouth every 8 (eight) hours as needed for moderate pain. [provider] Taking Active Self  allopurinol (ZYLOPRIM) 300 MG tablet [id number] Yes TAKE 1 TABLET(300 MG) BY MOUTH DAILY [name], Alinda Sierras, MD Taking Active   amiodarone (PACERONE) 200 MG tablet [id number] Yes TAKE 1 TABLET(200 MG) BY MOUTH DAILY [name], NP Taking Active   apixaban (ELIQUIS) 5 MG TABS tablet [id number] Yes Take 1 tablet (5 mg total) by mouth 2 (two) times daily. TAKE 1 TABLET(5  MG) BY MOUTH TWICE DAILY Strength: 5 mg [name], MD Taking Active   B COMPLEX-C-FOLIC ACID PO [id number] Yes Take 1 tablet by mouth daily. [provider] Taking Active Self  Blood Glucose Monitoring Suppl [facility] VERIO) w/Device KIT [id number] Yes Use 1-4 times daily as needed/directed DX E11.9 [name], Alinda Sierras, MD Taking Active Self  divalproex (DEPAKOTE SPRINKLE) 125 MG capsule [id number] Yes Take 125 mg by mouth 3 (three) times daily. [provider] Taking Active Self  DULoxetine (CYMBALTA) 60 MG capsule [id number] Yes TAKE 1 CAPSULE(60 MG) BY MOUTH DAILY [name], Alinda Sierras, MD Taking Active   gabapentin (NEURONTIN) 300 MG capsule [id number] Yes TAKE 2 CAPSULES BY MOUTH AT NIGHT AS NEEDED FOR RESTLESS LEGS OR SYMPTOMS  Patient taking differently: Take 600 mg by mouth at bedtime as needed (restless legs or symptoms).   [name], MD Taking Active   HM LIDOCAINE [facility] [id number] Yes Apply 1 patch topically as needed (pain). [provider] Taking Active Self  Insulin Pen Needle (NOVOFINE PEN NEEDLE) 32G X 6 MM MISC [id number] Yes Use 1-4 times daily as needed for insulin [name], MD Taking Active Self  JANUVIA 100 MG tablet [id number] Yes TAKE 1 TABLET(100 MG) BY MOUTH DAILY [name], Alinda Sierras, MD Taking Active   JARDIANCE 25 MG TABS tablet [id number] Yes TAKE 1 TABLET(25 MG) BY MOUTH DAILY BEFORE AND BREAKFAST  Patient taking differently: Take 25 mg by mouth daily.   [name], MD Taking Active   Lancets [facility] ULTRASOFT) lancets [id number] Yes Use  1-4 times daily as needed/directed  DX E11.9 [name], MD Taking Active Self  Magnesium Oxide 400 MG CAPS [id number] Yes Take 1 capsule (400 mg total) by mouth daily. [name], MD Taking Active   metFORMIN (GLUCOPHAGE-XR) 500 MG 24 hr tablet [id number] Yes Take 1 tablet (500 mg total) by mouth in the morning and at bedtime. [name], MD Taking Active Self   metoprolol tartrate (LOPRESSOR) 50 MG tablet [id number] Yes TAKE 1 TABLET(50 MG) BY MOUTH TWICE DAILY [name], NP Taking Active   montelukast (SINGULAIR) 10 MG tablet [id number] No TAKE 1 TABLET BY MOUTH AT BEDTIME  Patient not taking: Reported on 12/01/2021   [name], MD Not Taking Active   Multiple Vitamin (MULTIVITAMIN WITH MINERALS) TABS tablet [id number] Yes Take 1 tablet by mouth daily. Mercy Riding, MD Taking Consider Medication Status and Discontinue Self  [facility] VERIO test strip [id number] Yes Use 1-4 times daily as directed/needed   DX E11.9 [name], MD Taking Active Self  rosuvastatin (CRESTOR) 40 MG tablet [id number] Yes TAKE 1 TABLET(40 MG) BY MOUTH DAILY [name], MD Taking Active   SKYRIZI PEN 150 MG/ML SOAJ [id number] Yes Inject into the skin. [provider] Taking Active   thiamine (VITAMIN B-1) 100 MG tablet [id number] Yes Take 500 mg by mouth daily. [provider] Taking Active Self  TOUJEO SOLOSTAR 300 UNIT/ML Solostar Pen [id number] Yes INJECT 50 UNITS INTO THE SKIN EVERY DAY [name], Alinda Sierras, MD Taking Active   TRELEGY ELLIPTA 100-62.5-25 MCG/ACT AEPB [id number] No INHALE 1 PUFF INTO THE LUNGS DAILY  Patient not taking: Reported on 12/01/2021   [name], MD Not Taking Active             Patient Active Problem List   Diagnosis Date Noted   Wernicke encephalopathy 10/14/2021   Psoriasis 10/14/2021   Hypokalemia 07/30/2021   Hypertension 06/05/2020   Pressure injury of skin 02/21/2020   Rapid atrial fibrillation ([place]) 02/20/2020   High anion gap metabolic acidosis 37/36/6815   Secondary hypercoagulable state ([place]) 11/02/2019   Paroxysmal atrial fibrillation ([place]) 10/24/2019   Degeneration of lumbar intervertebral disc 09/27/2019   Thoracic aortic aneurysm 02/08/2019   Hemoptysis 10/06/2018   S/P right THA, 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   S/P hip replacement 09/12/2018   Low back pain 05/19/2018   Sleep apnea  01/09/2018   Persistent atrial fibrillation 12/12/2017   Allergic rhinitis 08/22/2015   Family history of colon cancer in mother deceased age 85 07-29-15   Type 2 diabetes mellitus, controlled ([place]) 07/17/2013   COPD (chronic obstructive pulmonary disease) ([place]) 07/05/2013   CAD (coronary artery disease) 06/26/2013   Hypertension associated with diabetes ([place]) 06/26/2013   Hyperlipidemia associated with type 2 diabetes mellitus ([place]) 06/26/2013   GERD (gastroesophageal reflux disease) 06/26/2013   History of atrial fibrillation without current medication 06/26/2013   Gout 06/26/2013   Osteoarthritis 06/26/2013   Asthma, mild persistent 06/26/2013   Alcohol abuse 06/26/2013   PTSD (post-traumatic stress disorder) 06/26/2013   HLA B27 (HLA B27 positive) 06/26/2013   Obesity (BMI 30-39.9) 06/26/2013   Benign essential hypertension 06/26/2013    Immunization History  Administered Date(s) Administered   Fluad Quad(high Dose 65+) 06/25/2019, 07/18/2020, 07/10/2021   Influenza Split 07/04/2012   Influenza, High Dose Seasonal PF 05/28/2016, 06/08/2017, 06/23/2018   Influenza,inj,Quad PF,6+ Mos 07/05/2013, 05/22/2014, 06/17/2015   PFIZER(Purple Top)SARS-COV-2 Vaccination 12/08/2019, 01/08/2020, 09/28/2020   Pneumococcal Conjugate-13 12/01/2015   Pneumococcal Polysaccharide-23  10/05/2006, 11/30/2016   Td 10/06/2009   Zoster Recombinat (Shingrix) 12/02/2016, 04/02/2017   Zoster, Live 08/21/2013    Conditions to be addressed/monitored:  Hypertension, Hyperlipidemia, Diabetes, Atrial Fibrillation, Coronary Artery Disease, COPD, and Allergic Rhinitis  There are no care plans that you recently modified to display for this patient.   Current Barriers:  {pharmacybarriers:24917}  Pharmacist Clinical Goal(s):  Patient will {PHARMACYGOALCHOICES:24921} through collaboration with PharmD and provider.   Interventions: 1:1 collaboration with [name], MD regarding development and  update of comprehensive plan of care as evidenced by provider attestation and co-signature Inter-disciplinary care team collaboration (see longitudinal plan of care) Comprehensive medication review performed; medication list updated in electronic medical record BP Readings from Last 3 Encounters:  12/01/21 132/80  10/14/21 (!) 150/80  08/10/21 (!) 156/80    Hypertension (BP goal <130/80) -Uncontrolled -Current treatment: Metoprolol tartrate 50 mg 1 tablet twice daily -Medications previously tried: ***  -Current home readings: checking - haven't done it since last visit to hospital (arm cuff)  -Current dietary habits: doesn't cook with salt or season with it; sometimes eating out once a week  -Current exercise habits: *** - -{ACTIONS;DENIES/REPORTS:21021675::"Denies"} hypotensive/hypertensive symptoms -Educated on {CCM BP Counseling:25124} -Counseled to monitor BP at home ***, document, and provide log at future appointments -{CCMPHARMDINTERVENTION:25122} -headaches every day - fluid in brain   Lab Results  Component Value Date   CHOL 137 09/23/2020   HDL 40 09/23/2020   LDLCALC 64 09/23/2020   LDLDIRECT 124.0 06/23/2018   TRIG 280 (H) 09/23/2020   CHOLHDL 3.4 09/23/2020   Hyperlipidemia: (LDL goal < 55) -Uncontrolled -Current treatment: Rosuvastatin 40 mg 1 tablet daily -Medications previously tried: ***  -Current dietary patterns: *** -Current exercise habits: *** -Educated on {CCM HLD Counseling:25126} -{CCMPHARMDINTERVENTION:25122} Over do   Lab Results  Component Value Date   HGBA1C 6.7 (A) 10/14/2021     Diabetes (A1c goal <7%) -Controlled -Current medications: Januvia 100 mg 1 tablet daily Metformin XR 500 mg 1 tablet twice daily - not taking? Not sent in since 12/21 Jardiance 25 mg 1 tablet daily Toujeo 300 units/ml inject 60 units daily - 80 units  -Medications previously tried: ***  -Current home glucose readings fasting glucose: *** post prandial  glucose: *** -{ACTIONS;DENIES/REPORTS:21021675::"Denies"} hypoglycemic/hyperglycemic symptoms -Current meal patterns:  breakfast: ***  lunch: ***  dinner: *** snacks: *** drinks: *** -Current exercise: *** -Educated on {CCM DM COUNSELING:25123} -Counseled to check feet daily and get yearly eye exams -{CCMPHARMDINTERVENTION:25122} -CGM? -metformin - can we switch to once a day - combine to one tablet -is injecting 80 units a long time ago - not checking blood sugars at home at all - was doing a month or two -CGM -  -  Atrial Fibrillation (Goal: prevent stroke and major bleeding) -{US controlled/uncontrolled:25276} -CHADSVASC: 5 -Current treatment: Rate/rhythm control: metoprolol tartrate 50 mg 1 tablet twice daily, amiodarone 200 mg 1 tablet daily Anticoagulation: Eliquis 5 mg 1 tablet twice daily -Medications previously tried: *** -Home BP and HR readings: ***  -Counseled on {CCMAFIBCOUNSELING:25120} -{CCMPHARMDINTERVENTION:25122} -brusiing easily - not sure where they are coming from -using the excedrin - pulmonologist?  COPD (Goal: control symptoms and prevent exacerbations) -{US controlled/uncontrolled:25276} -Current treatment  Trelegy - not taking? Pulmicort 0.5 mg inhale contents of vial via nebulizer twice daily Brovana 15 mcg inhale contents of vial via nebulizer twice daily Yupelri 175 mcg inhale contents of vial via nebulizer once daily -Medications previously tried: ***  -Gold Grade: {CHL HP Upstream Pharm COPD Gold XBLTJ:[id number]} -  Current COPD Classification:  {CHL HP Upstream Pharm COPD Classification:[phone]} -MMRC/CAT score: *** -Pulmonary function testing: *** -Exacerbations requiring treatment in last 6 months: *** -Patient {Actions; denies-reports:120008} consistent use of maintenance inhaler -Frequency of rescue inhaler use: *** -Counseled on {CCMINHALERCOUNSELING:25121} -{CCMPHARMDINTERVENTION:25122}  Depression/Anxiety (Goal: ***) -{US  controlled/uncontrolled:25276} -Current treatment: Duloxetine 60 mg 1 capsule daily -Medications previously tried/failed: *** -PHQ9: *** -GAD7: *** -Connected with *** for mental health support -Educated on {CCM mental health counseling:25127} -{CCMPHARMDINTERVENTION:25122} -has been helpful   Allergic rhinitis (Goal: ***) -{US controlled/uncontrolled:25276} -Current treatment  Montelukast 10 mg 1 tablet at bedtime - not taking? -Medications previously tried: ***  -{CCMPHARMDINTERVENTION:25122} Took it yesterday with spring time - only taking seasonally  -Advil cold and sinus  -flonase (not super helpful) -saline nasal spray -allergy sprays?  Restless legs syndrome (Goal: ***) -{US controlled/uncontrolled:25276} -Current treatment  Gabapentin 300 mg 2 capsules as needed (1-2 capsules) -Medications previously tried: ***  -{CCMPHARMDINTERVENTION:25122} -helping - 3-4 nights a week  Seizures? (Goal: ***) -{US controlled/uncontrolled:25276} -Current treatment  Divalproex 125 mg 1 capsule three times daily -Medications previously tried: ***  -{CCMPHARMDINTERVENTION:25122} -Divalproex is he presribiding? -prescibred in [place] - not sure if he is   Psoriasis (Goal: ***) -{US controlled/uncontrolled:25276} -Current treatment  Skyrizi 150 mg/ml inject  -Medications previously tried: ***  -{CCMPHARMDINTERVENTION:25122}  Gout (Goal: ***) -{US controlled/uncontrolled:25276} -Current treatment  Allopurinol 300 mg 1 tablet daily -Medications previously tried: ***  -{CCMPHARMDINTERVENTION:25122} No results found for: LABURIC No flare ups lately  Health Maintenance -Vaccine gaps: tetanus, COVID booster -Current therapy:  Magnesium oxide 400 mg 1 capsule daily - not taking -   Thiamine 100 mg 500 mg daily - not taking  Vitamin B complex-Folic acid daily  -Educated on {ccm supplement counseling:25128} -{CCM Patient satisfied:25129} -{CCMPHARMDINTERVENTION:25122} -can we  restart thiamine? 34 ounces x 3 bottles per day  Patient Goals/Self-Care Activities Patient will:  - {pharmacypatientgoals:24919}  Follow Up Plan: {CM FOLLOW UP ZTAE:82574}   Medication Assistance: {MEDASSISTANCEINFO:25044}  Compliance/Adherence/Medication fill history: Care Gaps: Eye exam, foot exam, COVID booster, tetanus BP- 132/80 ( 12/01/21) A1c - 6.7% (10/14/21)  Star-Rating Drugs: Sitagliptin Phosphate 100 mg - Last filled 09/04/21 90 DS at [facility] Empagliflozin 25 mg - Last filled 06/02/21 90 DS at [facility] Metformin 500 mg - Last filled 03/04/21 90 DS at [facility] Rosuvastatin 40 mg - Last filled 08/24/21 90 DS at [facility]  Patient's preferred pharmacy is:  [place], [place] - [address] [place]  [address] Phone: [phone] Fax: [phone]  [place], [place] - [address] Melina Modena [address] Janann Colonel [place] [address] Phone: [phone] Fax: [phone]  Uses pill box? No - uses a tub Pt endorses ***% compliance - he won't sort them  We discussed: {Pharmacy options:24294} Patient decided to: {[facility] Pharmacy [facility]  Care Plan and Follow Up Patient Decision:  {FOLLOWUP:24991}  Plan: {CM FOLLOW UP QFDV:44514}  [name], PharmD, [facility] Clinical Pharmacist {MP practice sites:26434} [phone]  Pill packaging - ?  134/78  -mychart

## 2021-12-17 NOTE — Patient Instructions (Signed)
Hi Adam [name], ? ?It was great to get to meet you in person! Below is a summary of some of the topics we discussed.  ? ?Don't forget to: ?Start checking blood pressure once a week at home ?Try to limit your use of Excedrin and Advil cold and sinus as these can thin your blood ?Try a saline nasal spray for congestion/stuffiness as needed ? ?Please reach out to me if you have any questions or need anything! ? ?Best, ?Maddie ? ?[name], PharmD, BCACP ?Clinical Pharmacist ?Therapist, music at [place] ?[phone] ? ? Visit Information ? ? Goals Addressed   ? ?  ?  ?  ?  ? This Visit's Progress  ?  Monitor and Manage My Blood Sugar-Diabetes Type 2     ?  Timeframe:  Long-Range Goal ?Priority:  High ?Start Date:                             ?Expected End Date:                      ? ?Follow Up Date 01/17/22  ?  ?- check blood sugar at prescribed times ?- check blood sugar if I feel it is too high or too low ?- take the blood sugar meter to all doctor visits  ?  ?Why is this important?   ?Checking your blood sugar at home helps to keep it from getting very high or very low.  ?Writing the results in a diary or log helps the doctor know how to care for you.  ?Your blood sugar log should have the time, date and the results.  ?Also, write down the amount of insulin or other medicine that you take.  ?Other information, like what you ate, exercise done and how you were feeling, will also be helpful.   ?  ?Notes:  ?  ?  Track and Manage My Blood Pressure-Hypertension     ?  Timeframe:  Long-Range Goal ?Priority:  High ?Start Date:                             ?Expected End Date:                      ? ?Follow Up Date 01/17/22  ?  ?- check blood pressure weekly ?- choose a place to take my blood pressure (home, clinic or office, retail store) ?- write blood pressure results in a log or diary  ?  ?Why is this important?   ?You won't feel high blood pressure, but it can still hurt your blood vessels.  ?High blood pressure can  cause heart or kidney problems. It can also cause a stroke.  ?Making lifestyle changes like losing a little weight or eating less salt will help.  ?Checking your blood pressure at home and at different times of the day can help to control blood pressure.  ?If the doctor prescribes medicine remember to take it the way the doctor ordered.  ?Call the office if you cannot afford the medicine or if there are questions about it.   ?  ?Notes:  ?  ? ?  ? ?Patient Care Plan: [place]  ?  ? ?Problem Identified: Problem: Hypertension, Hyperlipidemia, Diabetes, Atrial Fibrillation, Coronary Artery Disease, COPD, and Allergic Rhinitis   ?  ? ?Long-Range Goal: Patient-Specific Goal   ?  Start Date: 12/07/2021  ?Expected End Date: 12/08/2022  ?This Visit's Progress: Not on track  ?Priority: High  ?Note:   ?Current Barriers:  ?Unable to independently monitor therapeutic efficacy ?Unable to self administer medications as prescribed ? ?Pharmacist Clinical Goal(s):  ?Patient will achieve adherence to monitoring guidelines and medication adherence to achieve therapeutic efficacy through collaboration with PharmD and provider.  ? ?Interventions: ?1:1 collaboration with [name], MD regarding development and update of comprehensive plan of care as evidenced by provider attestation and co-signature ?Inter-disciplinary care team collaboration (see longitudinal plan of care) ?Comprehensive medication review performed; medication list updated in electronic medical record ? ?Hypertension (BP goal <140/90) ?-Uncontrolled ?-Current treatment: ?Metoprolol tartrate 50 mg 1 tablet twice daily - Appropriate, Query effective, Safe, Accessible ?-Medications previously tried: unknown  ?-Current home readings: not checking often - haven't done it since last visit to hospital (arm cuff)  ?-Current dietary habits: doesn't cook with salt or season with it; sometimes eating out once a week  ?-Current exercise habits: no exercise ?-Reports  hypotensive/hypertensive symptoms ?-Educated on BP goals and benefits of medications for prevention of heart attack, stroke and kidney damage; ?Importance of home blood pressure monitoring; ?Proper BP monitoring technique; ?Symptoms of hypotension and importance of maintaining adequate hydration; ?-Counseled to monitor BP at home weekly, document, and provide log at future appointments ?-Counseled on diet and exercise extensively ?Recommended to continue current medication ? ?Hyperlipidemia: (LDL goal < 55) ?-Uncontrolled ?-Current treatment: ?Rosuvastatin 40 mg 1 tablet daily - Appropriate, Query effective, Safe, Accessible ?-Medications previously tried: none  ?-Current dietary patterns: doesn't eat out much; drinking several drinks every day ?-Current exercise habits: none ?-Educated on Cholesterol goals;  ?Benefits of statin for ASCVD risk reduction; ?Importance of limiting foods high in cholesterol; ?-Counseled on diet and exercise extensively ?Recommended repeat lipid panel and consider addition of Zetia if LDL is not < 55. ? ?Diabetes (A1c goal <7%) ?-Controlled ?-Current medications: ?Januvia 100 mg 1 tablet daily - Appropriate, Effective, Safe, Accessible ?Metformin XR 500 mg 1 tablet twice daily - Appropriate, Effective, Safe, Accessible ?Jardiance 25 mg 1 tablet daily - Appropriate, Effective, Safe, Accessible ?Toujeo 300 units/ml inject 60 units daily - injecting 80 units - Query Appropriate, Effective, Query Safe, Accessible ?-Medications previously tried: unknown  ?-Current home glucose readings ?fasting glucose: does not check ?post prandial glucose: does not check ?-Denies hypoglycemic/hyperglycemic symptoms ?-Current meal patterns: sometime skips lunch or light lunch ?breakfast: cereal, eggs and bacon, raspberries sometimes ?lunch: cold cuts sandwich  ?dinner: wife cooks - usually meat and vegetable ?snacks: cookies, chips, etc. ?drinks: wine in AM, scotch at night; water, gatorade zero; coffee  (caff) sometimes ?-Current exercise: no exercise ?-Educated on A1c and blood sugar goals; ?Complications of diabetes including kidney damage, retinal damage, and cardiovascular disease; ?Benefits of routine self-monitoring of blood sugar; ?Continuous glucose monitoring; ?Carbohydrate counting and/or plate method ?-Counseled to check feet daily and get yearly eye exams ?-Recommended switching to Synjardy XR 25-2000 mg daily to ease pill burden.  ?Collaborated with PCP to start continous glucose monitoring. ? ?Atrial Fibrillation (Goal: prevent stroke and major bleeding) ?-Controlled ?-CHADSVASC: 5 ?-Current treatment: ?Rate/rhythm control: metoprolol tartrate 50 mg 1 tablet twice daily, amiodarone 200 mg 1 tablet daily - Appropriate, Effective, Safe, Accessible ?Anticoagulation: Eliquis 5 mg 1 tablet twice daily - Appropriate, Effective, Safe, Accessible ?-Medications previously tried: none ?-Home BP and HR readings: refer to above - not checking regularly  ?-Counseled on importance of adherence to anticoagulant exactly as prescribed; ?bleeding risk  associated with Eliquis and importance of self-monitoring for signs/symptoms of bleeding; ?avoidance of NSAIDs due to increased bleeding risk with anticoagulants; ?-Recommended to continue current medication ?Recommended avoiding Excedrin with aspirin component. ? ?COPD (Goal: control symptoms and prevent exacerbations) ?-Uncontrolled ?-Current treatment  ?Pulmicort 0.5 mg inhale contents of vial via nebulizer twice daily - haven't started ?Brovana 15 mcg inhale contents of vial via nebulizer twice daily - haven't started ?Yupelri 175 mcg inhale contents of vial via nebulizer once daily - haven't started ?-Medications previously tried: Trelegy  ?-Gold Grade: Gold 2 (FEV1 50-79%) ?-Current COPD Classification:  B (high sx, <2 exacerbations/yr) ?-MMRC/CAT score: n/a ?-Pulmonary function testing: 2013 ?-Exacerbations requiring treatment in last 6 months: none ?-Patient  denies consistent use of maintenance inhaler ?-Frequency of rescue inhaler use: not using yet ?-Counseled on [place] inhaler technique; ?Benefits of consistent maintenance inhaler use ?When to use rescue inhaler ?

## 2021-12-18 ENCOUNTER — Other Ambulatory Visit: Payer: Self-pay | Admitting: Family Medicine

## 2021-12-18 MED ORDER — SYNJARDY XR 25-1000 MG PO TB24: 1.0000 | ORAL_TABLET | Freq: Every day | ORAL | 3 refills | Status: DC

## 2021-12-18 NOTE — Progress Notes (Signed)
Switching his Jardiance and metformin to Synjardy XR 25/1000 one daily ?

## 2021-12-25 ENCOUNTER — Ambulatory Visit: Payer: Medicare Other

## 2021-12-30 ENCOUNTER — Ambulatory Visit (INDEPENDENT_AMBULATORY_CARE_PROVIDER_SITE_OTHER): Payer: Medicare Other

## 2021-12-30 VITALS — BP 120/60 | HR 74 | Temp 97.9°F | Ht 70.0 in | Wt 242.3 lb

## 2021-12-30 DIAGNOSIS — Z0000 Encounter for general adult medical examination without abnormal findings: Secondary | ICD-10-CM | POA: Diagnosis not present

## 2021-12-30 NOTE — Progress Notes (Signed)
? ?Subjective:  ? Adam [name] is a 71 y.o. male who presents for Medicare Annual/Subsequent preventive examination. ? ?Review of Systems    ?   ?Objective:  ?  ?Today's Vitals  ? 12/30/21 1546  ?BP: 120/60  ?Pulse: 74  ?Temp: 97.9 ?F (36.6 ?C)  ?TempSrc: Oral  ?SpO2: 96%  ?Weight: 242 lb 4.8 oz (109.9 kg)  ?Height: '5\' 10"'  (1.778 m)  ? ?Body mass index is 34.77 kg/m?. ? ? ?  12/30/2021  ?  4:08 PM 07/31/2021  ?  4:10 AM 03/23/2021  ? 11:00 PM 03/23/2021  ?  7:02 AM 03/21/2021  ?  2:00 PM 03/20/2021  ?  5:13 PM 03/16/2021  ? 11:07 AM  ?Advanced Directives  ?Does Patient Have a Medical Advance Directive? Yes No  Yes No No No  ?Type of Paramedic of [place];Living will        ?Does patient want to make changes to medical advance directive? No - Patient declined        ?Copy of [place] in Chart? No - copy requested        ?Would patient like information on creating a medical advance directive?  No - Patient declined No - Guardian declined  No - Guardian declined No - Guardian declined   ? ? ?Current Medications (verified) ?Outpatient Encounter Medications as of 12/30/2021  ?Medication Sig  ? allopurinol (ZYLOPRIM) 300 MG tablet TAKE 1 TABLET(300 MG) BY MOUTH DAILY  ? amiodarone (PACERONE) 200 MG tablet TAKE 1 TABLET(200 MG) BY MOUTH DAILY  ? apixaban (ELIQUIS) 5 MG TABS tablet Take 1 tablet (5 mg total) by mouth 2 (two) times daily. TAKE 1 TABLET(5 MG) BY MOUTH TWICE DAILY Strength: 5 mg  ? arformoterol (BROVANA) 15 MCG/2ML NEBU Take 2 mLs (15 mcg total) by nebulization 2 (two) times daily.  ? B COMPLEX-C-FOLIC ACID PO Take 1 tablet by mouth daily.  ? Blood Glucose Monitoring Suppl (ONETOUCH VERIO) w/Device KIT Use 1-4 times daily as needed/directed DX E11.9  ? budesonide (PULMICORT) 0.5 MG/2ML nebulizer solution Take 2 mLs (0.5 mg total) by nebulization in the morning and at bedtime. (Patient not taking: Reported on 12/07/2021)  ? divalproex (DEPAKOTE SPRINKLE) 125 MG capsule  Take 125 mg by mouth 3 (three) times daily.  ? DULoxetine (CYMBALTA) 60 MG capsule TAKE 1 CAPSULE(60 MG) BY MOUTH DAILY  ? Empagliflozin-metFORMIN HCl ER (SYNJARDY XR) 25-1000 MG TB24 Take 1 tablet by mouth daily.  ? gabapentin (NEURONTIN) 300 MG capsule TAKE 2 CAPSULES BY MOUTH AT NIGHT AS NEEDED FOR RESTLESS LEGS OR SYMPTOMS (Patient taking differently: Take 600 mg by mouth at bedtime as needed (restless legs or symptoms).)  ? HM LIDOCAINE PATCH EX Apply 1 patch topically as needed (pain).  ? Insulin Pen Needle (NOVOFINE PEN NEEDLE) 32G X 6 MM MISC Use 1-4 times daily as needed for insulin  ? JANUVIA 100 MG tablet TAKE 1 TABLET(100 MG) BY MOUTH DAILY  ? Lancets (ONETOUCH ULTRASOFT) lancets Use 1-4 times daily as needed/directed  DX E11.9  ? Magnesium Oxide 400 MG CAPS Take 1 capsule (400 mg total) by mouth daily. (Patient not taking: Reported on 12/07/2021)  ? metoprolol tartrate (LOPRESSOR) 50 MG tablet TAKE 1 TABLET(50 MG) BY MOUTH TWICE DAILY  ? montelukast (SINGULAIR) 10 MG tablet TAKE 1 TABLET BY MOUTH AT BEDTIME (Patient not taking: Reported on 12/01/2021)  ? ONETOUCH VERIO test strip Use 1-4 times daily as directed/needed   DX E11.9  ?  revefenacin (YUPELRI) 175 MCG/3ML nebulizer solution Take 3 mLs (175 mcg total) by nebulization daily. (Patient not taking: Reported on 12/07/2021)  ? rosuvastatin (CRESTOR) 40 MG tablet TAKE 1 TABLET(40 MG) BY MOUTH DAILY  ? SKYRIZI PEN 150 MG/ML SOAJ Inject into the skin.  ? sodium chloride (OCEAN) 0.65 % SOLN nasal spray Place 1 spray into both nostrils as needed for congestion.  ? thiamine (VITAMIN B-1) 100 MG tablet Take 500 mg by mouth daily.  ? TOUJEO SOLOSTAR 300 UNIT/ML Solostar Pen INJECT 60 UNITS INTO THE SKIN EVERY DAY  ? ?No facility-administered encounter medications on file as of 12/30/2021.  ? ? ?Allergies (verified) ?Xarelto [rivaroxaban], Amoxicillin, Augmentin [amoxicillin-pot clavulanate], Azithromycin, Clindamycin/lincomycin, and Keflex [cephalexin]   ? ?History: ?Past Medical History:  ?Diagnosis Date  ? Alcohol withdrawal ([place]) 10/24/2019  ? Allergy   ? Anxiety   ? history of PTSD following CABG  ? Ascending aortic aneurysm 01/31/2018  ? 43 x 42 mm, pt unaware  ? Asthma   ? Cardiomegaly 10/17/2017  ? Colitis- colonoscopy 2014 07/13/2015  ? COPD (chronic obstructive pulmonary disease) ([place])   ? Coronary artery disease   ? x 6  ? Depression   ? Diabetes mellitus without complication ([place])   ? Family history of polyps in the colon   ? Finger dislocation   ? Left pinkie  ? GERD (gastroesophageal reflux disease)   ? Gout   ? H/O atrial fibrillation without current medication   ? following CABG with no documented episodes since then.  ? Heart palpitations   ? Hx of adenomatous colonic polyps 08/12/2010  ? Hyperlipidemia   ? Hypertension   ? OA (osteoarthritis)   ? OSA (obstructive sleep apnea)   ? Mild, has not received CPAP yet  ? Prediabetes   ? Protein-calorie malnutrition, severe 03/01/2020  ? RLS (restless legs syndrome)   ? Squamous cell carcinoma of scalp 2016  ? Moh's  ? ?Past Surgical History:  ?Procedure Laterality Date  ? ANKLE FRACTURE SURGERY Right 1991  ? APPENDECTOMY    ? ATRIAL FIBRILLATION ABLATION N/A 03/31/2018  ? Procedure: ATRIAL FIBRILLATION ABLATION;  Surgeon: [name], MD;  Location: [facility];  Service: Cardiovascular;  Laterality: N/A;  ? CARDIAC ELECTROPHYSIOLOGY MAPPING AND ABLATION    ? CARDIOVERSION N/A 03/23/2021  ? Procedure: CARDIOVERSION;  Surgeon: Thayer Headings, MD;  Location: [facility];  Service: Cardiovascular;  Laterality: N/A;  ? COLONOSCOPY  06/11/2021  ? COLONOSCOPY W/ BIOPSIES  2017  ? x7  ? CORONARY ANGIOPLASTY WITH STENT PLACEMENT    ? CORONARY ARTERY BYPASS GRAFT    ? FINGER SURGERY  04/2018  ? Small finger left hand  ? implantable loop recorder placement  07/02/2019  ? Medtronic Reveal [place] model [id number] ([place] [id number]) implanted in office by Dr Rayann Heman  ? TEE WITHOUT CARDIOVERSION N/A 03/30/2018  ?  Procedure: TRANSESOPHAGEAL ECHOCARDIOGRAM (TEE);  Surgeon: [name], MD;  Location: [facility];  Service: Cardiovascular;  Laterality: N/A;  ? TEE WITHOUT CARDIOVERSION N/A 03/23/2021  ? Procedure: TRANSESOPHAGEAL ECHOCARDIOGRAM (TEE);  Surgeon: Acie Fredrickson Wonda Cheng, MD;  Location: [facility];  Service: Cardiovascular;  Laterality: N/A;  ? TOTAL HIP ARTHROPLASTY Left   ? TOTAL HIP ARTHROPLASTY Right 09/12/2018  ? Procedure: TOTAL HIP ARTHROPLASTY ANTERIOR APPROACH;  Surgeon: [name], MD;  Location: [facility];  Service: Orthopedics;  Laterality: Right;  70 mins  ? ?Family History  ?Problem Relation Age of Onset  ? Colon cancer Mother   ? Cancer  Father   ?     UNKNOWN TYPE  ? Heart disease Paternal Grandmother   ? Heart disease Paternal Grandfather   ? Esophageal cancer Neg Hx   ? Rectal cancer Neg Hx   ? Stomach cancer Neg Hx   ? ?Social History  ? ?Socioeconomic History  ? Marital status: Married  ?  Spouse name: Not on file  ? Number of children: 0  ? Years of education: Not on file  ? Highest education level: Not on file  ?Occupational History  ? Occupation: retired  ?Tobacco Use  ? Smoking status: Former  ?  Packs/day: 1.00  ?  Years: 7.00  ?  Pack years: 7.00  ?  Types: Cigarettes  ?  Quit date: 10/04/1977  ?  Years since quitting: 44.2  ? Smokeless tobacco: Never  ? Tobacco comments:  ?  never smoked over 1 pack   ?Vaping Use  ? Vaping Use: Never used  ?Substance and Sexual Activity  ? Alcohol use: Yes  ?  Alcohol/week: 3.0 - 4.0 standard drinks  ?  Types: 1 Glasses of wine, 2 - 3 Shots of liquor per week  ?  Comment: DAILY  ? Drug use: No  ?  Comment: Smoked Marijuana back in 1970s  ? Sexual activity: Yes  ?Other Topics Concern  ? Not on file  ?Social History Narrative  ? Married, no children  ? Textile industry x yrs  ? Laid off early 38's and 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 work - returned to [employer] from [place] 2014  ? Enjoys bird watching, reading historical books about WWII, watching tv  ? ?Social Determinants  of Health  ? ?Financial Resource Strain: Low Risk   ? Difficulty of Paying Living Expenses: Not hard at all  ?Food Insecurity: No Food Insecurity  ? Worried About Charity fundraiser in the Last Year: Never true  ? [employer]

## 2021-12-30 NOTE — Patient Instructions (Signed)
?Adam [name] , ?Thank you for taking time to come for your Medicare Wellness Visit. I appreciate your ongoing commitment to your health goals. Please review the following plan we discussed and let me know if I can assist you in the future.  ? ?These are the goals we discussed: ? Goals   ? ?   decrease pain (pt-stated)   ?   Decrease pain. Maintain current lifestyle. ?  ?   Exercise 3x per week (30 min per time)   ?   Daily building gait and balance ?  ?   mental health outlet   ?   Consider counseling or therapy as an outlet to express feelings about declining health and aging ?  ?   Monitor and Manage My Blood Sugar-Diabetes Type 2   ?   Timeframe:  Long-Range Goal ?Priority:  High ?Start Date:                             ?Expected End Date:                      ? ?Follow Up Date 01/17/22  ?  ?- check blood sugar at prescribed times ?- check blood sugar if I feel it is too high or too low ?- take the blood sugar meter to all doctor visits  ?  ?Why is this important?   ?Checking your blood sugar at home helps to keep it from getting very high or very low.  ?Writing the results in a diary or log helps the doctor know how to care for you.  ?Your blood sugar log should have the time, date and the results.  ?Also, write down the amount of insulin or other medicine that you take.  ?Other information, like what you ate, exercise done and how you were feeling, will also be helpful.   ?  ?Notes:  ?  ?   Prevent falls   ?   Track and Manage My Blood Pressure-Hypertension   ?   Timeframe:  Long-Range Goal ?Priority:  High ?Start Date:                             ?Expected End Date:                      ? ?Follow Up Date 01/17/22  ?  ?- check blood pressure weekly ?- choose a place to take my blood pressure (home, clinic or office, retail store) ?- write blood pressure results in a log or diary  ?  ?Why is this important?   ?You won't feel high blood pressure, but it can still hurt your blood vessels.  ?High blood pressure can  cause heart or kidney problems. It can also cause a stroke.  ?Making lifestyle changes like losing a little weight or eating less salt will help.  ?Checking your blood pressure at home and at different times of the day can help to control blood pressure.  ?If the doctor prescribes medicine remember to take it the way the doctor ordered.  ?Call the office if you cannot afford the medicine or if there are questions about it.   ?  ?Notes:  ?  ? ?  ?  ?This is a list of the screening recommended for you and due dates:  ?Health Maintenance  ?Topic Date Due  ?  Complete foot exam   09/23/2020  ? COVID-19 Vaccine (4 - Booster for Pfizer series) 11/23/2020  ? Eye exam for diabetics  09/01/2021  ? Tetanus Vaccine  12/31/2022*  ? Hemoglobin A1C  04/13/2022  ? Pneumonia Vaccine  Completed  ? Flu Shot  Completed  ? Hepatitis C Screening: USPSTF Recommendation to screen - Ages 83-79 yo.  Completed  ? Zoster (Shingles) Vaccine  Completed  ? HPV Vaccine  Aged Out  ? Colon Cancer Screening  Discontinued  ?*Topic was postponed. The date shown is not the original due date.  ? ?Advanced directives: Yes Patient will bring copy ? ?Conditions/risks identified: None ? ?Next appointment: Follow up in one year for your annual wellness visit.  ? ?Preventive Care 62 Years and Older, Male ?Preventive care refers to lifestyle choices and visits with your health care provider that can promote health and wellness. ?What does preventive care include? ?A yearly physical exam. This is also called an annual well check. ?Dental exams once or twice a year. ?Routine eye exams. Ask your health care provider how often you should have your eyes checked. ?Personal lifestyle choices, including: ?Daily care of your teeth and gums. ?Regular physical activity. ?Eating a healthy diet. ?Avoiding tobacco and drug use. ?Limiting alcohol use. ?Practicing safe sex. ?Taking low doses of aspirin every day. ?Taking vitamin and mineral supplements as recommended by your  health care provider. ?What happens during an annual well check? ?The services and screenings done by your health care provider during your annual well check will depend on your age, overall health, lifestyle risk factors, and family history of disease. ?Counseling  ?Your health care provider may ask you questions about your: ?Alcohol use. ?Tobacco use. ?Drug use. ?Emotional well-being. ?Home and relationship well-being. ?Sexual activity. ?Eating habits. ?History of falls. ?Memory and ability to understand (cognition). ?Work and work Statistician. ?Screening  ?You may have the following tests or measurements: ?Height, weight, and BMI. ?Blood pressure. ?Lipid and cholesterol levels. These may be checked every 5 years, or more frequently if you are over 10 years old. ?Skin check. ?Lung cancer screening. You may have this screening every year starting at age 36 if you have a 30-pack-year history of smoking and currently smoke or have quit within the past 15 years. ?Fecal occult blood test (FOBT) of the stool. You may have this test every year starting at age 47. ?Flexible sigmoidoscopy or colonoscopy. You may have a sigmoidoscopy every 5 years or a colonoscopy every 10 years starting at age 26. ?Prostate cancer screening. Recommendations will vary depending on your family history and other risks. ?Hepatitis C blood test. ?Hepatitis B blood test. ?Sexually transmitted disease (STD) testing. ?Diabetes screening. This is done by checking your blood sugar (glucose) after you have not eaten for a while (fasting). You may have this done every 1-3 years. ?Abdominal aortic aneurysm (AAA) screening. You may need this if you are a current or former smoker. ?Osteoporosis. You may be screened starting at age 56 if you are at high risk. ?Talk with your health care provider about your test results, treatment options, and if necessary, the need for more tests. ?Vaccines  ?Your health care provider may recommend certain vaccines, such  as: ?Influenza vaccine. This is recommended every year. ?Tetanus, diphtheria, and acellular pertussis (Tdap, Td) vaccine. You may need a Td booster every 10 years. ?Zoster vaccine. You may need this after age 62. ?Pneumococcal 13-valent conjugate (PCV13) vaccine. One dose is recommended after age 50. ?Pneumococcal polysaccharide (PPSV23)  vaccine. One dose is recommended after age 46. ?Talk to your health care provider about which screenings and vaccines you need and how often you need them. ?This information is not intended to replace advice given to you by your health care provider. Make sure you discuss any questions you have with your health care provider. ?Document Released: 10/17/2015 Document Revised: 06/09/2016 Document Reviewed: 07/22/2015 ?Elsevier Interactive Patient Education ? 2017 [place]. ? ?Fall Prevention in the Home ?Falls can cause injuries. They can happen to people of all ages. There are many things you can do to make your home safe and to help prevent falls. ?What can I do on the outside of my home? ?Regularly fix the edges of walkways and driveways and fix any cracks. ?Remove anything that might make you trip as you walk through a door, such as a raised step or threshold. ?Trim any bushes or trees on the path to your home. ?Use bright outdoor lighting. ?Clear any walking paths of anything that might make someone trip, such as rocks or tools. ?Regularly check to see if handrails are loose or broken. Make sure that both sides of any steps have handrails. ?Any raised decks and porches should have guardrails on the edges. ?Have any leaves, snow, or ice cleared regularly. ?Use sand or salt on walking paths during winter. ?Clean up any spills in your garage right away. This includes oil or grease spills. ?What can I do in the bathroom? ?Use night lights. ?Install grab bars by the toilet and in the tub and shower. Do not use towel bars as grab bars. ?Use non-skid mats or decals in the tub or  shower. ?If you need to sit down in the shower, use a plastic, non-slip stool. ?Keep the floor dry. Clean up any water that spills on the floor as soon as it happens. ?Remove soap buildup in the tub or sho

## 2022-01-01 DIAGNOSIS — E1142 Type 2 diabetes mellitus with diabetic polyneuropathy: Secondary | ICD-10-CM

## 2022-01-01 DIAGNOSIS — I4891 Unspecified atrial fibrillation: Secondary | ICD-10-CM

## 2022-01-04 ENCOUNTER — Ambulatory Visit (INDEPENDENT_AMBULATORY_CARE_PROVIDER_SITE_OTHER): Payer: Medicare Other

## 2022-01-04 DIAGNOSIS — I480 Paroxysmal atrial fibrillation: Secondary | ICD-10-CM

## 2022-01-04 LAB — CUP PACEART REMOTE DEVICE CHECK
Date Time Interrogation Session: 20230401002859
Implantable Pulse Generator Implant Date: 20200928

## 2022-01-06 ENCOUNTER — Other Ambulatory Visit: Payer: Self-pay | Admitting: Family Medicine

## 2022-01-12 ENCOUNTER — Other Ambulatory Visit (HOSPITAL_COMMUNITY): Payer: Self-pay | Admitting: Nurse Practitioner

## 2022-01-12 ENCOUNTER — Ambulatory Visit: Payer: Medicare Other | Admitting: Family Medicine

## 2022-01-12 DIAGNOSIS — E119 Type 2 diabetes mellitus without complications: Secondary | ICD-10-CM | POA: Diagnosis not present

## 2022-01-13 ENCOUNTER — Encounter: Payer: Self-pay | Admitting: Nurse Practitioner

## 2022-01-13 ENCOUNTER — Ambulatory Visit: Payer: Medicare Other | Admitting: Nurse Practitioner

## 2022-01-13 ENCOUNTER — Other Ambulatory Visit (HOSPITAL_COMMUNITY): Payer: Self-pay | Admitting: Nurse Practitioner

## 2022-01-13 VITALS — BP 146/86 | HR 77 | Ht 70.0 in | Wt 241.6 lb

## 2022-01-13 DIAGNOSIS — J301 Allergic rhinitis due to pollen: Secondary | ICD-10-CM | POA: Diagnosis not present

## 2022-01-13 DIAGNOSIS — E512 Wernicke's encephalopathy: Secondary | ICD-10-CM

## 2022-01-13 DIAGNOSIS — J441 Chronic obstructive pulmonary disease with (acute) exacerbation: Secondary | ICD-10-CM | POA: Diagnosis not present

## 2022-01-13 DIAGNOSIS — R059 Cough, unspecified: Secondary | ICD-10-CM | POA: Diagnosis not present

## 2022-01-13 MED ORDER — BREZTRI AEROSPHERE 160-9-4.8 MCG/ACT IN AERO: 2.0000 | INHALATION_SPRAY | Freq: Two times a day (BID) | RESPIRATORY_TRACT | 0 refills | Status: DC

## 2022-01-13 MED ORDER — PREDNISONE 10 MG PO TABS: ORAL_TABLET | ORAL | 0 refills | Status: DC

## 2022-01-13 MED ORDER — ALBUTEROL SULFATE (2.5 MG/3ML) 0.083% IN NEBU
2.5000 mg | INHALATION_SOLUTION | Freq: Four times a day (QID) | RESPIRATORY_TRACT | 12 refills | Status: DC | PRN
Start: 2022-01-13 — End: 2022-07-29

## 2022-01-13 MED ORDER — SPACER/AERO-HOLDING CHAMBERS DEVI: 1 refills | Status: DC

## 2022-01-13 NOTE — Patient Instructions (Addendum)
-  Trial Breztri 2 puffs Twice daily with spacer. Brush tongue and rinse mouth afterwards. Please let me know if you feel better with this and we can send in a prescription ?-Continue Albuterol inhaler 2 puffs or 3 mL neb every 6 hours as needed for shortness of breath or wheezing. Notify if symptoms persist despite rescue inhaler/neb use. ?-Continue singulair 10 mg At bedtime  ? ?-Prednisone taper. 4 tabs for 2 days, then 3 tabs for 2 days, 2 tabs for 2 days, then 1 tab for 2 days, then stop. Take in AM with food ?-Mucinex 600 mg Twice daily for cough/congestion ?-Flutter valve 2-3 times a day  ? ?Follow up in 2 weeks with Dr. Lamonte Sakai or Alanson Aly. If symptoms do not improve or worsen, please contact office for sooner follow up or seek emergency care. ?

## 2022-01-13 NOTE — Assessment & Plan Note (Addendum)
Well-controlled with Singulair. ?

## 2022-01-13 NOTE — Assessment & Plan Note (Addendum)
AECOPD. Difficulties with compliance with nebs. Evaluated ability to take a deep breath; I believe he may be able to use HFA type inhaler but still concerned given his co morbidities. Will trial therapy with Breztri with spacer; otherwise, discussed the importance of consistent use of nebs. Prednisone taper. Mucociliary clearance therapies. Close follow up ? ?Patient Instructions  ?-Trial Breztri 2 puffs Twice daily with spacer. Brush tongue and rinse mouth afterwards. Please let me know if you feel better with this and we can send in a prescription ?-Continue Albuterol inhaler 2 puffs or 3 mL neb every 6 hours as needed for shortness of breath or wheezing. Notify if symptoms persist despite rescue inhaler/neb use. ?-Continue singulair 10 mg At bedtime  ? ?-Prednisone taper. 4 tabs for 2 days, then 3 tabs for 2 days, 2 tabs for 2 days, then 1 tab for 2 days, then stop. Take in AM with food ?-Mucinex 600 mg Twice daily for cough/congestion ?-Flutter valve 2-3 times a day  ? ?Follow up in 2 weeks with Dr. Lamonte Sakai or Alanson Aly. If symptoms do not improve or worsen, please contact office for sooner follow up or seek emergency care. ? ? ?

## 2022-01-13 NOTE — Progress Notes (Signed)
? ?_0  ID: Adam [name], male    DOB: Feb 27, 1951, 71 y.o.   MRN: [id number] ? ?Chief Complaint  ?Patient presents with  ? Follow-up  ?  COPD, would like o2  ? ? ?Referring provider: ?[name], MD ? ?HPI: ?71 year old male, former remote smoker followed for COPD/asthma. He is a patient of Dr. Agustina Caroli and last seen in office on 12/01/2021. Past medical history significant for Wernike encephalopathy, possible normal pressure hydrocephalus, thoracic aortic aneurysm, PAF on Eliquis, HTN, CAD, sleep apnea, GERD, HLD, DM II, OA, psoriasis, ETOH abuse, PTSD, gout, obesity.  ? ?TEST/EVENTS:  ?09/26/2015 PFTs: FVC 81, FEV1 82, ratio 75, TLC 110%, DLCOunc 90%. +BD (16%) Reversible mild obstructive airway disease  ?03/23/2021 echo TEE: EF 45-50%. Mild to mod MR. Mild TR.  ? ?12/01/2021: OV with Dr. Lamonte Sakai. Recently diagnosed with Wernike's, progressive, and possible normal pressure hydrocephalus. Difficulties inhaling Trelegy. Increased cough with mucous and SOB. Changed from Trelegy to triple therapy nebs.  ? ?01/13/2022: Today - follow up ?Patient presents today with wife for follow up. He has had some increased shortness of breath, progressive over the past few months but worse over the past week or two. Increased cough with occasional clear sputum. Also some associated wheezing. Has not been using neb treatments consistently. His wife feels like they are too much for him to do with his encephalopathy and take up most of his mornings to try to complete. Denies any leg swelling, fevers, orthopnea, PND, or hemoptysis.  ? ?Allergies  ?Allergen Reactions  ? Xarelto [Rivaroxaban] Other (See Comments)  ?  Patient stated he "ended up in the hospital with a rectal bleed"  ? Amoxicillin Rash and Other (See Comments)  ?  RASH IN GROIN AREA ?Has patient had a PCN reaction causing immediate rash, facial/tongue/throat swelling, SOB or lightheadedness with hypotension: no ?Has patient had a PCN reaction causing severe rash  involving mucus membranes or skin necrosis: no ?Has patient had a PCN reaction that required hospitalization: no ?Has patient had a PCN reaction occurring within the last 10 years: yes ?If all of the above answers are "NO", then may proceed with Cephalosporin use. ?  ? Augmentin [Amoxicillin-Pot Clavulanate] Rash and Other (See Comments)  ?  RASH IN GROIN AREA  ? Azithromycin Rash and Other (See Comments)  ?  RASH IN GROIN AREA  ? Clindamycin/Lincomycin Rash  ? Keflex [Cephalexin] Rash  ? ? ?Immunization History  ?Administered Date(s) Administered  ? Fluad Quad(high Dose 65+) 06/25/2019, 07/18/2020, 07/10/2021  ? Influenza Split 07/04/2012  ? Influenza, High Dose Seasonal PF 05/28/2016, 06/08/2017, 06/23/2018  ? Influenza,inj,Quad PF,6+ Mos 07/05/2013, 05/22/2014, 06/17/2015  ? PFIZER(Purple Top)SARS-COV-2 Vaccination 12/08/2019, 01/08/2020, 09/28/2020  ? Pneumococcal Conjugate-13 12/01/2015  ? Pneumococcal Polysaccharide-23 10/05/2006, 11/30/2016  ? Td 10/06/2009  ? Zoster Recombinat (Shingrix) 12/02/2016, 04/02/2017  ? Zoster, Live 08/21/2013  ? ? ?Past Medical History:  ?Diagnosis Date  ? Alcohol withdrawal ([place]) 10/24/2019  ? Allergy   ? Anxiety   ? history of PTSD following CABG  ? Ascending aortic aneurysm ([place]) 01/31/2018  ? 43 x 42 mm, pt unaware  ? Asthma   ? Cardiomegaly 10/17/2017  ? Colitis- colonoscopy 2014 07/13/2015  ? COPD (chronic obstructive pulmonary disease) ([place])   ? Coronary artery disease   ? x 6  ? Depression   ? Diabetes mellitus without complication ([place])   ? Family history of polyps in the colon   ? Finger dislocation   ? Left pinkie  ?  GERD (gastroesophageal reflux disease)   ? Gout   ? H/O atrial fibrillation without current medication   ? following CABG with no documented episodes since then.  ? Heart palpitations   ? Hx of adenomatous colonic polyps 08/12/2010  ? Hyperlipidemia   ? Hypertension   ? OA (osteoarthritis)   ? OSA (obstructive sleep apnea)   ? Mild, has not received CPAP yet   ? Prediabetes   ? Protein-calorie malnutrition, severe 03/01/2020  ? RLS (restless legs syndrome)   ? Squamous cell carcinoma of scalp 2016  ? Moh's  ? ? ?Tobacco History: ?Social History  ? ?Tobacco Use  ?Smoking Status Former  ? Packs/day: 1.00  ? Years: 7.00  ? Pack years: 7.00  ? Types: Cigarettes  ? Quit date: 10/04/1977  ? Years since quitting: 44.3  ?Smokeless Tobacco Never  ?Tobacco Comments  ? never smoked over 1 pack   ? ?Counseling given: Not Answered ?Tobacco comments: never smoked over 1 pack  ? ? ?Outpatient Medications Prior to Visit  ?Medication Sig Dispense Refill  ? allopurinol (ZYLOPRIM) 300 MG tablet TAKE 1 TABLET(300 MG) BY MOUTH DAILY 90 tablet 0  ? apixaban (ELIQUIS) 5 MG TABS tablet Take 1 tablet (5 mg total) by mouth 2 (two) times daily. TAKE 1 TABLET(5 MG) BY MOUTH TWICE DAILY Strength: 5 mg 180 tablet 1  ? arformoterol (BROVANA) 15 MCG/2ML NEBU Take 2 mLs (15 mcg total) by nebulization 2 (two) times daily. 120 mL 6  ? B COMPLEX-C-FOLIC ACID PO Take 1 tablet by mouth daily.    ? Blood Glucose Monitoring Suppl (ONETOUCH VERIO) w/Device KIT Use 1-4 times daily as needed/directed DX E11.9 1 kit 0  ? divalproex (DEPAKOTE SPRINKLE) 125 MG capsule Take 125 mg by mouth 3 (three) times daily.    ? DULoxetine (CYMBALTA) 60 MG capsule TAKE 1 CAPSULE(60 MG) BY MOUTH DAILY 90 capsule 1  ? Empagliflozin-metFORMIN HCl ER (SYNJARDY XR) 25-1000 MG TB24 Take 1 tablet by mouth daily. 90 tablet 3  ? gabapentin (NEURONTIN) 300 MG capsule TAKE 2 CAPSULES BY MOUTH AT NIGHT AS NEEDED FOR RESTLESS LEGS OR SYMPTOMS (Patient taking differently: Take 600 mg by mouth at bedtime as needed (restless legs or symptoms).) 180 capsule 3  ? HM LIDOCAINE PATCH EX Apply 1 patch topically as needed (pain).    ? Insulin Pen Needle (NOVOFINE PEN NEEDLE) 32G X 6 MM MISC Use 1-4 times daily as needed for insulin 100 each 3  ? JANUVIA 100 MG tablet TAKE 1 TABLET(100 MG) BY MOUTH DAILY 90 tablet 0  ? Lancets (ONETOUCH ULTRASOFT)  lancets Use 1-4 times daily as needed/directed  DX E11.9 200 each 12  ? Magnesium Oxide 400 MG CAPS Take 1 capsule (400 mg total) by mouth daily. 90 capsule 1  ? metoprolol tartrate (LOPRESSOR) 50 MG tablet TAKE 1 TABLET(50 MG) BY MOUTH TWICE DAILY 60 tablet 5  ? montelukast (SINGULAIR) 10 MG tablet TAKE 1 TABLET BY MOUTH AT BEDTIME 90 tablet 1  ? ONETOUCH VERIO test strip Use 1-4 times daily as directed/needed   DX E11.9 200 each 12  ? revefenacin (YUPELRI) 175 MCG/3ML nebulizer solution Take 3 mLs (175 mcg total) by nebulization daily. 21 mL 0  ? rosuvastatin (CRESTOR) 40 MG tablet TAKE 1 TABLET(40 MG) BY MOUTH DAILY 90 tablet 1  ? SKYRIZI PEN 150 MG/ML SOAJ Inject into the skin.    ? sodium chloride (OCEAN) 0.65 % SOLN nasal spray Place 1 spray into both nostrils as  needed for congestion.    ? thiamine (VITAMIN B-1) 100 MG tablet Take 500 mg by mouth daily.    ? TOUJEO SOLOSTAR 300 UNIT/ML Solostar Pen INJECT 60 UNITS INTO THE SKIN EVERY DAY 33 mL 3  ? amiodarone (PACERONE) 200 MG tablet TAKE 1 TABLET(200 MG) BY MOUTH DAILY 90 tablet 1  ? budesonide (PULMICORT) 0.5 MG/2ML nebulizer solution Take 2 mLs (0.5 mg total) by nebulization in the morning and at bedtime. (Patient not taking: Reported on 12/07/2021) 2 mL 12  ? ?No facility-administered medications prior to visit.  ? ? ? ?Review of Systems:  ? ?Constitutional: No weight loss or gain, night sweats, fevers, chills, fatigue, or lassitude. ?HEENT: No headaches, difficulty swallowing, tooth/dental problems, or sore throat. No sneezing, itching, ear ache, nasal congestion, or post nasal drip ?CV:  No chest pain, orthopnea, PND, swelling in lower extremities, anasarca, dizziness, palpitations, syncope ?Resp: +shortness of breath with exertion; cough with minimal clear sputum production. No excess mucus or change in color of mucus.  No hemoptysis. No wheezing.  No chest wall deformity ?GI:  No heartburn, indigestion, abdominal pain, nausea, vomiting, diarrhea, change  in bowel habits, loss of appetite, bloody stools.  ?Skin: No rash, lesions, ulcerations ?Neuro: No dizziness or lightheadedness.  ?Psych: No depression or anxiety. Mood stable.  ? ? ? ?Physical Exam: ? ?BP Marland Kitchen

## 2022-01-13 NOTE — Assessment & Plan Note (Signed)
Wife feels this is contributing to his difficulties with nebulizer therapy. Recently with stable mental status. A&Ox3 on exam today with dysarthria present, which is unchanged from baseline. Follow up with neurology as scheduled.  ?

## 2022-01-14 NOTE — Progress Notes (Signed)
[facility] Summary Report / Loop Recorder 

## 2022-01-19 DIAGNOSIS — E119 Type 2 diabetes mellitus without complications: Secondary | ICD-10-CM | POA: Diagnosis not present

## 2022-01-20 ENCOUNTER — Ambulatory Visit (INDEPENDENT_AMBULATORY_CARE_PROVIDER_SITE_OTHER): Payer: Medicare Other | Admitting: Family Medicine

## 2022-01-20 ENCOUNTER — Encounter: Payer: Self-pay | Admitting: Family Medicine

## 2022-01-20 VITALS — BP 144/70 | HR 77 | Temp 97.7°F | Ht 70.0 in | Wt 245.4 lb

## 2022-01-20 DIAGNOSIS — F1010 Alcohol abuse, uncomplicated: Secondary | ICD-10-CM | POA: Diagnosis not present

## 2022-01-20 DIAGNOSIS — E785 Hyperlipidemia, unspecified: Secondary | ICD-10-CM

## 2022-01-20 DIAGNOSIS — E512 Wernicke's encephalopathy: Secondary | ICD-10-CM

## 2022-01-20 DIAGNOSIS — F5104 Psychophysiologic insomnia: Secondary | ICD-10-CM | POA: Diagnosis not present

## 2022-01-20 DIAGNOSIS — E1169 Type 2 diabetes mellitus with other specified complication: Secondary | ICD-10-CM

## 2022-01-20 DIAGNOSIS — E1142 Type 2 diabetes mellitus with diabetic polyneuropathy: Secondary | ICD-10-CM

## 2022-01-20 DIAGNOSIS — E119 Type 2 diabetes mellitus without complications: Secondary | ICD-10-CM | POA: Diagnosis not present

## 2022-01-20 LAB — POCT GLYCOSYLATED HEMOGLOBIN (HGB A1C): Hemoglobin A1C: 5.8 % — AB (ref 4.0–5.6)

## 2022-01-20 MED ORDER — TRAZODONE HCL 50 MG PO TABS: 25.0000 mg | ORAL_TABLET | Freq: Every evening | ORAL | 1 refills | Status: DC | PRN

## 2022-01-20 NOTE — Patient Instructions (Signed)
Scale the Toujeo insulin back to 60 units once daily ? ?Try the Trazodone 50 one at night for insomnia  ? ?A1C today well controlled at 5.8% ? ?Consider setting up in office appt with Maddie to go over Dexcom glucose monitor.   ?

## 2022-01-20 NOTE — Progress Notes (Signed)
?Subjective:  ?  ? Patient ID: Adam [name], male   DOB: [birth year], 71 y.o.   MRN: [id number] ? ?HPI ? ?Patient accompanied by wife.  He was unfortunately late getting to his appointment.  He has multiple problems including history of atrial fibrillation, longstanding history of ongoing alcohol abuse, hypertension, CAD, sleep apnea, COPD, type 2 diabetes, hyperlipidemia, Wernicke encephalopathy, psoriasis, osteoarthritis.  Recently saw pulmonary.  Was started on Breztri with spacer and apparently has not been consistently taking this. ? ?Type 2 diabetes.  Last A1c 6.7%.  We did order external monitoring system with Dexcom.  He has not yet started.  He had questions regarding how to use this.  Also has been taking high-dose Toujeo 80 units daily and was advised to reduce this.  No recent hypoglycemic symptoms. ? ?Continued daily alcohol abuse.  He was equivocal regarding volumes but his wife states he drinks wine and scotch with multiple doses throughout the day starting early in the morning.  Wernicke cephalopathy with significant speech impairment. ? ?Chronic insomnia.  At one point was on high-dose benzodiazepines.  We declined further prescribing with his alcohol abuse.  He continues to have difficulty sleeping at night.  We have reiterated multiple times link between alcohol abuse and insomnia.  He refuses to quit. ? ?Past Medical History:  ?Diagnosis Date  ? Alcohol withdrawal ([place]) 10/24/2019  ? Allergy   ? Anxiety   ? history of PTSD following CABG  ? Ascending aortic aneurysm ([place]) 01/31/2018  ? 43 x 42 mm, pt unaware  ? Asthma   ? Cardiomegaly 10/17/2017  ? Colitis- colonoscopy 2014 07/13/2015  ? COPD (chronic obstructive pulmonary disease) ([place])   ? Coronary artery disease   ? x 6  ? Depression   ? Diabetes mellitus without complication ([place])   ? Family history of polyps in the colon   ? Finger dislocation   ? Left pinkie  ? GERD (gastroesophageal reflux disease)   ? Gout   ? H/O atrial fibrillation  without current medication   ? following CABG with no documented episodes since then.  ? Heart palpitations   ? Hx of adenomatous colonic polyps 08/12/2010  ? Hyperlipidemia   ? Hypertension   ? OA (osteoarthritis)   ? OSA (obstructive sleep apnea)   ? Mild, has not received CPAP yet  ? Prediabetes   ? Protein-calorie malnutrition, severe 03/01/2020  ? RLS (restless legs syndrome)   ? Squamous cell carcinoma of scalp 2016  ? Moh's  ? ?Past Surgical History:  ?Procedure Laterality Date  ? ANKLE FRACTURE SURGERY Right 1991  ? APPENDECTOMY    ? ATRIAL FIBRILLATION ABLATION N/A 03/31/2018  ? Procedure: ATRIAL FIBRILLATION ABLATION;  Surgeon: [name], MD;  Location: [facility];  Service: Cardiovascular;  Laterality: N/A;  ? CARDIAC ELECTROPHYSIOLOGY MAPPING AND ABLATION    ? CARDIOVERSION N/A 03/23/2021  ? Procedure: CARDIOVERSION;  Surgeon: Thayer Headings, MD;  Location: [facility];  Service: Cardiovascular;  Laterality: N/A;  ? COLONOSCOPY  06/11/2021  ? COLONOSCOPY W/ BIOPSIES  2017  ? x7  ? CORONARY ANGIOPLASTY WITH STENT PLACEMENT    ? CORONARY ARTERY BYPASS GRAFT    ? FINGER SURGERY  04/2018  ? Small finger left hand  ? implantable loop recorder placement  07/02/2019  ? Medtronic Reveal [place] model [id number] ([place] [id number]) implanted in office by Dr Rayann Heman  ? TEE WITHOUT CARDIOVERSION N/A 03/30/2018  ? Procedure: TRANSESOPHAGEAL ECHOCARDIOGRAM (TEE);  Surgeon: [name], MD;  [facility];  Service: Cardiovascular;  Laterality: N/A;  ? TEE WITHOUT CARDIOVERSION N/A 03/23/2021  ? Procedure: TRANSESOPHAGEAL ECHOCARDIOGRAM (TEE);  Surgeon: Acie Fredrickson Wonda Cheng, MD;  Location: [facility];  Service: Cardiovascular;  Laterality: N/A;  ? TOTAL HIP ARTHROPLASTY Left   ? TOTAL HIP ARTHROPLASTY Right 09/12/2018  ? Procedure: TOTAL HIP ARTHROPLASTY ANTERIOR APPROACH;  Surgeon: [name], MD;  Location: [facility];  Service: Orthopedics;  Laterality: Right;  70 mins  ? ? reports that he quit smoking  about 44 years ago. His smoking use included cigarettes. He has a 7.00 pack-year smoking history. He has never used smokeless tobacco. He reports current alcohol use of about 3.0 - 4.0 standard drinks per week. He reports that he does not use drugs. ?family history includes Cancer in his father; Colon cancer in his mother; Heart disease in his paternal grandfather and paternal grandmother. ?Allergies  ?Allergen Reactions  ? Xarelto [Rivaroxaban] Other (See Comments)  ?  Patient stated he "ended up in the hospital with a rectal bleed"  ? Amoxicillin Rash and Other (See Comments)  ?  RASH IN GROIN AREA ?Has patient had a PCN reaction causing immediate rash, facial/tongue/throat swelling, SOB or lightheadedness with hypotension: no ?Has patient had a PCN reaction causing severe rash involving mucus membranes or skin necrosis: no ?Has patient had a PCN reaction that required hospitalization: no ?Has patient had a PCN reaction occurring within the last 10 years: yes ?If all of the above answers are "NO", then may proceed with Cephalosporin use. ?  ? Augmentin [Amoxicillin-Pot Clavulanate] Rash and Other (See Comments)  ?  RASH IN GROIN AREA  ? Azithromycin Rash and Other (See Comments)  ?  RASH IN GROIN AREA  ? Clindamycin/Lincomycin Rash  ? Keflex [Cephalexin] Rash  ? ? ? ?Review of Systems  ?Constitutional:  Negative for chills and fever.  ?Respiratory:  Negative for cough.   ?Cardiovascular:  Negative for chest pain.  ?Genitourinary:  Negative for dysuria.  ?Psychiatric/Behavioral:  Positive for sleep disturbance. Negative for agitation.   ? ?   ?Objective:  ? Physical Exam ?Vitals reviewed.  ?Constitutional:   ?   General: He is not in acute distress. ?Cardiovascular:  ?   Rate and Rhythm: Normal rate.  ?Pulmonary:  ?   Effort: Pulmonary effort is normal.  ?   Breath sounds: Normal breath sounds. No wheezing or rales.  ?Musculoskeletal:  ?   Comments: No significant pitting edema at this time.  ?Neurological:  ?    Mental Status: He is alert.  ?   Comments: Patient has very slurred speech secondary to his chronic encephalopathy related to alcohol.  ? ? ?   ?Assessment:  ?   ?#1 type 2 diabetes.  A1c today 5.8%.  Over medicated with regard to basal insulin.  See below. ? ?#2 ongoing alcohol abuse.  Patient has no motivation to quit or scale back ? ?#3 COPD.  Recently prescribed Breztri -but not currently taking this.  He states he had confusion regarding his spacer.  Lung exam is clear today compared with last visit. ? ?#4 chronic insomnia.  Wife states he is napping frequently during the day and also alcohol abuse as above which is likely very much contributing.  We discussed risk of benzodiazepine which he was inquiring about again today ?   ?Plan:  ?   ?-Scale back Toujeo insulin to 60 units daily ?-We have advised a scheduled follow-up with our pharmacist to go over Dexcom monitoring  system ?-Also recommend they bring in his spacer device to use with his inhaler at that time for review ?-We did discuss possible use of low-dose trazodone 50 mg nightly.  He is advised to avoid daytime naps.  Suspect he will have ongoing insomnia issues with his alcohol abuse.  Avoid benzodiazepines. ?-Routine follow-up in 3 months and sooner as needed ? ?[name] MD ?[facility] ? ?   ?

## 2022-01-27 ENCOUNTER — Ambulatory Visit (HOSPITAL_COMMUNITY)
Admission: RE | Admit: 2022-01-27 | Discharge: 2022-01-27 | Disposition: A | Discharge status: Home / Self Care | Payer: Medicare Other | Source: Ambulatory Visit | Attending: Nurse Practitioner | Admitting: Nurse Practitioner

## 2022-01-27 VITALS — BP 118/70 | HR 78 | Ht 70.0 in | Wt 242.8 lb

## 2022-01-27 DIAGNOSIS — G4733 Obstructive sleep apnea (adult) (pediatric): Secondary | ICD-10-CM | POA: Insufficient documentation

## 2022-01-27 DIAGNOSIS — I719 Aortic aneurysm of unspecified site, without rupture: Secondary | ICD-10-CM | POA: Diagnosis not present

## 2022-01-27 DIAGNOSIS — I4819 Other persistent atrial fibrillation: Secondary | ICD-10-CM | POA: Diagnosis not present

## 2022-01-27 DIAGNOSIS — E512 Wernicke's encephalopathy: Secondary | ICD-10-CM | POA: Diagnosis not present

## 2022-01-27 DIAGNOSIS — I480 Paroxysmal atrial fibrillation: Secondary | ICD-10-CM

## 2022-01-27 DIAGNOSIS — E669 Obesity, unspecified: Secondary | ICD-10-CM | POA: Diagnosis not present

## 2022-01-27 DIAGNOSIS — I2510 Atherosclerotic heart disease of native coronary artery without angina pectoris: Secondary | ICD-10-CM | POA: Diagnosis not present

## 2022-01-27 DIAGNOSIS — Z6834 Body mass index (BMI) 34.0-34.9, adult: Secondary | ICD-10-CM | POA: Insufficient documentation

## 2022-01-27 DIAGNOSIS — Z7901 Long term (current) use of anticoagulants: Secondary | ICD-10-CM | POA: Insufficient documentation

## 2022-01-27 DIAGNOSIS — D6869 Other thrombophilia: Secondary | ICD-10-CM

## 2022-01-27 DIAGNOSIS — E119 Type 2 diabetes mellitus without complications: Secondary | ICD-10-CM | POA: Diagnosis not present

## 2022-01-27 DIAGNOSIS — I10 Essential (primary) hypertension: Secondary | ICD-10-CM | POA: Insufficient documentation

## 2022-01-27 DIAGNOSIS — I712 Thoracic aortic aneurysm, without rupture, unspecified: Secondary | ICD-10-CM

## 2022-01-27 DIAGNOSIS — Z951 Presence of aortocoronary bypass graft: Secondary | ICD-10-CM | POA: Diagnosis not present

## 2022-01-27 LAB — COMPREHENSIVE METABOLIC PANEL
ALT: 32 U/L (ref 0–44)
AST: 39 U/L (ref 15–41)
Albumin: 3.5 g/dL (ref 3.5–5.0)
Alkaline Phosphatase: 48 U/L (ref 38–126)
Anion gap: 9 (ref 5–15)
BUN: 12 mg/dL (ref 8–23)
CO2: 26 mmol/L (ref 22–32)
Calcium: 9 mg/dL (ref 8.9–10.3)
Chloride: 106 mmol/L (ref 98–111)
Creatinine, Ser: 1.01 mg/dL (ref 0.61–1.24)
GFR, Estimated: >60 mL/min (ref >60)
Glucose, Bld: 109 mg/dL — ABNORMAL HIGH (ref 70–99)
Potassium: 4 mmol/L (ref 3.5–5.1)
Sodium: 141 mmol/L (ref 135–145)
Total Bilirubin: 1 mg/dL (ref 0.3–1.2)
Total Protein: 6.7 g/dL (ref 6.5–8.1)

## 2022-01-27 LAB — TSH: TSH: 1.652 u[IU]/mL (ref 0.350–4.500)

## 2022-01-27 NOTE — Progress Notes (Signed)
? ?Primary Care Physician: [name], MD ?Referring Physician: Dr. Rayann Heman ?Primary Cardiologist: Dr [place] ? ?Adam [name] is a 71 y.o. male with a h/o  CABG in 2011, done in [place], HTN, HLD, DM, Atrial fibrillation,alcohol use, and OSA. He underwent catheter ablation for atrial fibrillation by Dr. Rayann Heman 03/31/18. He was followed by Dr. Radford Pax in sleep clinic, but unable to tolerate CPAP and turned in his equipment. He was previously on Xarelto for a CHADS2VASC score of 3. Patient was admitted 10/24/19 for GI bleeding and alcohol withdrawal. EP was consulted to assist with dofetilide reloading. His Xarelto was held on admission and was not resumed at discharge, he was eventually placed back on eliquis.  He was discharged to rehab for a prolonged period of time for Wernicke's encephalopathy.  ? ?He is now back in the afib clinic for pt feeling rapid heart rate.. By his device, he had had 3 episodes of afib.Fastest rate was 146 bpm, with longest duration of 44 mins. Recent burden was actually improved at 9.4%, previously at 14%. Presenting rhythm for the interpretation was in rapid a  Flutter and he continues in this rhythm today. He continues o drink alcohol, when asked the amount, he states "enough". He continues to have untreated OSA.  ? ?F/u in afib clinic, 03/31/21. He had a hospitalization for afib with [facility], 6-17 thru 6-21, worsened with not taking cardizem for 3 days as the drug store was out of it per pt. He was having ongoing alcohol abuse. Last alcohol was that am, wine with breakfast. He had some slurred speech, left facial droop that had occurred days earlier, PCP obtained MRI, negative for stroke.EP was consulted doe RVR, cardizem drip started and the decision was made to stop Tikosyn and start amio load. Pt was on amio in  the remote past, had issues with skin discoloration but EP did not see any alternative drug options. Hospitalization was complicated by alcohol withdrawal.  ? ?In the office  today, afib is rate controlled. He continues on amiodarone 400 mg bid, x 2 weeks. He states he has missed a few doses of DOAC, reminded not to do this with pending cardioversion. He feels improved.  ? ?F/u in afib clinic 04/21/21 after an ER visit for palpitations and found to be in afib. He had missed several dose of amiodarone and eliquis the week before. Planned for reloading of amiodarone and sent home. He is in SR today. He has been on 200 mg bid for the last 2 weeks. He will lower to 200 mg daily now. His is describing some constipation with amiodarone.  ? ?F/u in afib clinic, 07/30/21 for surveillance of amiodarone. He remains in SR but with a prolonged qtc at 592 ms. He denies any new drugs or benadryl use. His wife states that he intake of alcohol has worsened. He has fallen numerous times and his speech is slurred today. This has been getting worse over several  weeks time. He saw his PCP last week, had same symptoms and was  referred to neurology, appointment next week. He states he drinks 3-4 beverage glass size mixed  scotch drinks a day. He has not noted any afib.  ? ?F/u in afib clinic, 01/27/22. He is in the clinic for amiodarone surveillance. He indicates that he has not noted any afib. He is in SR today. The biggest change I see today is that his speech is very garbled, only being able to make out a few words. His  wife states that it is progression of  Wernicke's encephalopathy. He continues  with abuse alcohol. He has occasional falls. He is requesting imaging of his aortic aneurysm be done. It has been almost 2 years insce last CT. Dr. Radford Pax ordered it  last but he no longer sees her as he turned in his CPAP and does not treat his afib.   ? ?Today, he denies symptoms of palpitations, chest pain, mild increase of shortness of breath and fatigue,  orthopnea, PND, lower extremity edema, dizziness, presyncope, syncope, or neurologic sequela. The patient is tolerating medications without difficulties  and is otherwise without complaint today.  ? ?Past Medical History:  ?Diagnosis Date  ? Alcohol withdrawal ([place]) 10/24/2019  ? Allergy   ? Anxiety   ? history of PTSD following CABG  ? Ascending aortic aneurysm ([place]) 01/31/2018  ? 43 x 42 mm, pt unaware  ? Asthma   ? Cardiomegaly 10/17/2017  ? Colitis- colonoscopy 2014 07/13/2015  ? COPD (chronic obstructive pulmonary disease) ([place])   ? Coronary artery disease   ? x 6  ? Depression   ? Diabetes mellitus without complication ([place])   ? Family history of polyps in the colon   ? Finger dislocation   ? Left pinkie  ? GERD (gastroesophageal reflux disease)   ? Gout   ? H/O atrial fibrillation without current medication   ? following CABG with no documented episodes since then.  ? Heart palpitations   ? Hx of adenomatous colonic polyps 08/12/2010  ? Hyperlipidemia   ? Hypertension   ? OA (osteoarthritis)   ? OSA (obstructive sleep apnea)   ? Mild, has not received CPAP yet  ? Prediabetes   ? Protein-calorie malnutrition, severe 03/01/2020  ? RLS (restless legs syndrome)   ? Squamous cell carcinoma of scalp 2016  ? Moh's  ? ?Past Surgical History:  ?Procedure Laterality Date  ? ANKLE FRACTURE SURGERY Right 1991  ? APPENDECTOMY    ? ATRIAL FIBRILLATION ABLATION N/A 03/31/2018  ? Procedure: ATRIAL FIBRILLATION ABLATION;  Surgeon: [name], MD;  Location: [facility];  Service: Cardiovascular;  Laterality: N/A;  ? CARDIAC ELECTROPHYSIOLOGY MAPPING AND ABLATION    ? CARDIOVERSION N/A 03/23/2021  ? Procedure: CARDIOVERSION;  Surgeon: Thayer Headings, MD;  Location: [facility];  Service: Cardiovascular;  Laterality: N/A;  ? COLONOSCOPY  06/11/2021  ? COLONOSCOPY W/ BIOPSIES  2017  ? x7  ? CORONARY ANGIOPLASTY WITH STENT PLACEMENT    ? CORONARY ARTERY BYPASS GRAFT    ? FINGER SURGERY  04/2018  ? Small finger left hand  ? implantable loop recorder placement  07/02/2019  ? Medtronic Reveal [place] model [id number] ([place] [id number]) implanted in office by Dr Rayann Heman  ? TEE WITHOUT  CARDIOVERSION N/A 03/30/2018  ? Procedure: TRANSESOPHAGEAL ECHOCARDIOGRAM (TEE);  Surgeon: [name], MD;  Location: [facility];  Service: Cardiovascular;  Laterality: N/A;  ? TEE WITHOUT CARDIOVERSION N/A 03/23/2021  ? Procedure: TRANSESOPHAGEAL ECHOCARDIOGRAM (TEE);  Surgeon: Acie Fredrickson Wonda Cheng, MD;  Location: [facility];  Service: Cardiovascular;  Laterality: N/A;  ? TOTAL HIP ARTHROPLASTY Left   ? TOTAL HIP ARTHROPLASTY Right 09/12/2018  ? Procedure: TOTAL HIP ARTHROPLASTY ANTERIOR APPROACH;  Surgeon: [name], MD;  Location: [facility];  Service: Orthopedics;  Laterality: Right;  70 mins  ? ? ?Current Outpatient Medications  ?Medication Sig Dispense Refill  ? albuterol (PROVENTIL) (2.5 MG/3ML) 0.083% nebulizer solution Take 3 mLs (2.5 mg total) by nebulization every 6 (six) hours as needed for  wheezing or shortness of breath. 75 mL 12  ? allopurinol (ZYLOPRIM) 300 MG tablet TAKE 1 TABLET(300 MG) BY MOUTH DAILY 90 tablet 0  ? amiodarone (PACERONE) 200 MG tablet TAKE 1 TABLET(200 MG) BY MOUTH DAILY 90 tablet 1  ? apixaban (ELIQUIS) 5 MG TABS tablet Take 1 tablet (5 mg total) by mouth 2 (two) times daily. TAKE 1 TABLET(5 MG) BY MOUTH TWICE DAILY Strength: 5 mg 180 tablet 1  ? B COMPLEX-C-FOLIC ACID PO Take 1 tablet by mouth daily.    ? Blood Glucose Monitoring Suppl (ONETOUCH VERIO) w/Device KIT Use 1-4 times daily as needed/directed DX E11.9 1 kit 0  ? divalproex (DEPAKOTE SPRINKLE) 125 MG capsule Take 125 mg by mouth 3 (three) times daily.    ? DULoxetine (CYMBALTA) 60 MG capsule TAKE 1 CAPSULE(60 MG) BY MOUTH DAILY 90 capsule 1  ? gabapentin (NEURONTIN) 300 MG capsule TAKE 2 CAPSULES BY MOUTH AT NIGHT AS NEEDED FOR RESTLESS LEGS OR SYMPTOMS (Patient taking differently: Take 600 mg by mouth at bedtime as needed (restless legs or symptoms).) 180 capsule 3  ? HM LIDOCAINE PATCH EX Apply 1 patch topically as needed (pain).    ? Insulin Pen Needle (NOVOFINE PEN NEEDLE) 32G X 6 MM MISC Use 1-4 times daily as  needed for insulin 100 each 3  ? JANUVIA 100 MG tablet TAKE 1 TABLET(100 MG) BY MOUTH DAILY 90 tablet 0  ? Lancets (ONETOUCH ULTRASOFT) lancets Use 1-4 times daily as needed/directed  DX E11.9 200 each 1

## 2022-01-28 ENCOUNTER — Ambulatory Visit: Payer: Medicare Other | Admitting: Nurse Practitioner

## 2022-01-28 ENCOUNTER — Ambulatory Visit (INDEPENDENT_AMBULATORY_CARE_PROVIDER_SITE_OTHER): Payer: Medicare Other

## 2022-01-28 ENCOUNTER — Encounter: Payer: Self-pay | Admitting: Nurse Practitioner

## 2022-01-28 VITALS — BP 118/72 | HR 78 | Temp 98.3°F | Ht 70.0 in | Wt 243.8 lb

## 2022-01-28 DIAGNOSIS — J441 Chronic obstructive pulmonary disease with (acute) exacerbation: Secondary | ICD-10-CM | POA: Diagnosis not present

## 2022-01-28 DIAGNOSIS — J449 Chronic obstructive pulmonary disease, unspecified: Secondary | ICD-10-CM | POA: Diagnosis not present

## 2022-01-28 DIAGNOSIS — J301 Allergic rhinitis due to pollen: Secondary | ICD-10-CM

## 2022-01-28 DIAGNOSIS — E512 Wernicke's encephalopathy: Secondary | ICD-10-CM

## 2022-01-28 DIAGNOSIS — J439 Emphysema, unspecified: Secondary | ICD-10-CM

## 2022-01-28 DIAGNOSIS — R059 Cough, unspecified: Secondary | ICD-10-CM | POA: Diagnosis not present

## 2022-01-28 NOTE — Patient Instructions (Addendum)
-  Trial Breztri 2 puffs Twice daily with spacer. Brush tongue and rinse mouth afterwards. Please let me know if you feel better with this and we can send in a prescription.  ?-If no improvement with Breztri, please return to using your neb treatments ?     -Brovana 2 mL Twice daily  ?     -Budesonide 2 mL Twice daily. Brush tongue and rinse mouth afterwards ?     -Yupelri 3 mL once daily  ?-Continue Albuterol inhaler 2 puffs or 3 mL neb every 6 hours as needed for shortness of breath or wheezing. Notify if symptoms persist despite rescue inhaler/neb use. ?-Continue singulair 10 mg At bedtime  ? ?-Mucinex 600 mg Twice daily for cough/congestion ?-Flutter valve 2-3 times a day  ? ?Chest x ray today.  ?  ?Follow up in one month with Dr. Lamonte Sakai or Alanson Aly. If symptoms do not improve or worsen, please contact office for sooner follow up or seek emergency care. ?

## 2022-01-28 NOTE — Progress Notes (Signed)
? ?'@Patient'  ID: Adam [name], male    DOB: January 11, 1951, 71 y.o.   MRN: [id number] ? ?Chief Complaint  ?Patient presents with  ? Follow-up  ?  SOB  ? ? ?Referring provider: ?[name], MD ? ?HPI: ?71 year old male, former remote smoker followed for COPD/asthma. He is a patient of Dr. Agustina Caroli and last seen in office on 12/01/2021. Past medical history significant for Wernike encephalopathy, possible normal pressure hydrocephalus, thoracic aortic aneurysm, PAF on Eliquis, HTN, CAD, sleep apnea, GERD, HLD, DM II, OA, psoriasis, ETOH abuse, PTSD, gout, obesity.  ? ?TEST/EVENTS:  ?09/26/2015 PFTs: FVC 81, FEV1 82, ratio 75, TLC 110%, DLCOunc 90%. +BD (16%) Reversible mild obstructive airway disease  ?03/23/2021 echo TEE: EF 45-50%. Mild to mod MR. Mild TR.  ? ?12/01/2021: OV with Dr. Lamonte Sakai. Recently diagnosed with Wernike's, progressive, and possible normal pressure hydrocephalus. Difficulties inhaling Trelegy. Increased cough with mucous and SOB. Changed from Trelegy to triple therapy nebs.  ? ?01/13/2022: OV with Jermane Brayboy NP for follow up. He has had some increased shortness of breath, progressive over the past few months but worse over the past week or two. Increased cough with occasional clear sputum. Also some associated wheezing. Has not been using neb treatments consistently. His wife feels like they are too much for him to do with his encephalopathy and take up most of his mornings to try to complete. Evaluated ability to take a deep breath in office; may be able to use HFA type inhaler with spacer. Advised we could trial Breztri but if he was unable to perform appropriate technique, we would need to return to nebs. Discussed the importance of compliance with maintenance therapy. Treated for AECOPD with pred taper.  ? ?01/28/2022: Today - follow up ?Patient presents today with wife for follow up. Since seeing him last, his breathing has improved some; still experiences some DOE. He does continue to have a  minimally productive cough with clear sputum. Denies any recent fevers, hemoptysis, leg swelling. He has not started using the Breztri inhaler as he was unsure of how to use it. He did receive the spacer from the pharmacy that we ordered last time. He rarely uses albuterol. He continues on singulair at bedtime. He has not started taking mucinex or using a flutter valve.  ? ?Allergies  ?Allergen Reactions  ? Xarelto [Rivaroxaban] Other (See Comments)  ?  Patient stated he "ended up in the hospital with a rectal bleed"  ? Amoxicillin Rash and Other (See Comments)  ?  RASH IN GROIN AREA ?Has patient had a PCN reaction causing immediate rash, facial/tongue/throat swelling, SOB or lightheadedness with hypotension: no ?Has patient had a PCN reaction causing severe rash involving mucus membranes or skin necrosis: no ?Has patient had a PCN reaction that required hospitalization: no ?Has patient had a PCN reaction occurring within the last 10 years: yes ?If all of the above answers are "NO", then may proceed with Cephalosporin use. ?  ? Augmentin [Amoxicillin-Pot Clavulanate] Rash and Other (See Comments)  ?  RASH IN GROIN AREA  ? Azithromycin Rash and Other (See Comments)  ?  RASH IN GROIN AREA  ? Clindamycin/Lincomycin Rash  ? Keflex [Cephalexin] Rash  ? ? ?Immunization History  ?Administered Date(s) Administered  ? Fluad Quad(high Dose 65+) 06/25/2019, 07/18/2020, 07/10/2021  ? Influenza Split 07/04/2012  ? Influenza, High Dose Seasonal PF 05/28/2016, 06/08/2017, 06/23/2018  ? Influenza,inj,Quad PF,6+ Mos 07/05/2013, 05/22/2014, 06/17/2015  ? PFIZER(Purple Top)SARS-COV-2 Vaccination 12/08/2019, 01/08/2020, 09/28/2020  ?  Pneumococcal Conjugate-13 12/01/2015  ? Pneumococcal Polysaccharide-23 10/05/2006, 11/30/2016  ? Td 10/06/2009  ? Zoster Recombinat (Shingrix) 12/02/2016, 04/02/2017  ? Zoster, Live 08/21/2013  ? ? ?Past Medical History:  ?Diagnosis Date  ? Alcohol withdrawal ([place]) 10/24/2019  ? Allergy   ? Anxiety   ?  history of PTSD following CABG  ? Ascending aortic aneurysm ([place]) 01/31/2018  ? 43 x 42 mm, pt unaware  ? Asthma   ? Cardiomegaly 10/17/2017  ? Colitis- colonoscopy 2014 07/13/2015  ? COPD (chronic obstructive pulmonary disease) ([place])   ? Coronary artery disease   ? x 6  ? Depression   ? Diabetes mellitus without complication ([place])   ? Family history of polyps in the colon   ? Finger dislocation   ? Left pinkie  ? GERD (gastroesophageal reflux disease)   ? Gout   ? H/O atrial fibrillation without current medication   ? following CABG with no documented episodes since then.  ? Heart palpitations   ? Hx of adenomatous colonic polyps 08/12/2010  ? Hyperlipidemia   ? Hypertension   ? OA (osteoarthritis)   ? OSA (obstructive sleep apnea)   ? Mild, has not received CPAP yet  ? Prediabetes   ? Protein-calorie malnutrition, severe 03/01/2020  ? RLS (restless legs syndrome)   ? Squamous cell carcinoma of scalp 2016  ? Moh's  ? ? ?Tobacco History: ?Social History  ? ?Tobacco Use  ?Smoking Status Former  ? Packs/day: 1.00  ? Years: 7.00  ? Pack years: 7.00  ? Types: Cigarettes  ? Quit date: 10/04/1977  ? Years since quitting: 44.3  ?Smokeless Tobacco Never  ?Tobacco Comments  ? never smoked over 1 pack   ? ?Counseling given: Not Answered ?Tobacco comments: never smoked over 1 pack  ? ? ?Outpatient Medications Prior to Visit  ?Medication Sig Dispense Refill  ? allopurinol (ZYLOPRIM) 300 MG tablet TAKE 1 TABLET(300 MG) BY MOUTH DAILY 90 tablet 0  ? amiodarone (PACERONE) 200 MG tablet TAKE 1 TABLET(200 MG) BY MOUTH DAILY 90 tablet 1  ? apixaban (ELIQUIS) 5 MG TABS tablet Take 1 tablet (5 mg total) by mouth 2 (two) times daily. TAKE 1 TABLET(5 MG) BY MOUTH TWICE DAILY Strength: 5 mg 180 tablet 1  ? B COMPLEX-C-FOLIC ACID PO Take 1 tablet by mouth daily.    ? Blood Glucose Monitoring Suppl (ONETOUCH VERIO) w/Device KIT Use 1-4 times daily as needed/directed DX E11.9 1 kit 0  ? divalproex (DEPAKOTE SPRINKLE) 125 MG capsule Take 125 mg by  mouth 3 (three) times daily.    ? DULoxetine (CYMBALTA) 60 MG capsule TAKE 1 CAPSULE(60 MG) BY MOUTH DAILY 90 capsule 1  ? gabapentin (NEURONTIN) 300 MG capsule TAKE 2 CAPSULES BY MOUTH AT NIGHT AS NEEDED FOR RESTLESS LEGS OR SYMPTOMS (Patient taking differently: Take 600 mg by mouth at bedtime as needed (restless legs or symptoms).) 180 capsule 3  ? HM LIDOCAINE PATCH EX Apply 1 patch topically as needed (pain).    ? Insulin Pen Needle (NOVOFINE PEN NEEDLE) 32G X 6 MM MISC Use 1-4 times daily as needed for insulin 100 each 3  ? JANUVIA 100 MG tablet TAKE 1 TABLET(100 MG) BY MOUTH DAILY 90 tablet 0  ? Lancets (ONETOUCH ULTRASOFT) lancets Use 1-4 times daily as needed/directed  DX E11.9 200 each 12  ? metoprolol tartrate (LOPRESSOR) 50 MG tablet TAKE 1 TABLET(50 MG) BY MOUTH TWICE DAILY 60 tablet 5  ? montelukast (SINGULAIR) 10 MG tablet TAKE 1 TABLET  BY MOUTH AT BEDTIME 90 tablet 1  ? ONETOUCH VERIO test strip Use 1-4 times daily as directed/needed   DX E11.9 200 each 12  ? rosuvastatin (CRESTOR) 40 MG tablet TAKE 1 TABLET(40 MG) BY MOUTH DAILY 90 tablet 1  ? SKYRIZI PEN 150 MG/ML SOAJ Inject into the skin. Takes every 3 months    ? sodium chloride (OCEAN) 0.65 % SOLN nasal spray Place 1 spray into both nostrils as needed for congestion.    ? Spacer/Aero-Holding Dorise Bullion Use with inhaler 1 each 1  ? thiamine (VITAMIN B-1) 100 MG tablet Take 500 mg by mouth daily.    ? TOUJEO SOLOSTAR 300 UNIT/ML Solostar Pen INJECT 60 UNITS INTO THE SKIN EVERY DAY 33 mL 3  ? traZODone (DESYREL) 50 MG tablet Take 0.5-1 tablets (25-50 mg total) by mouth at bedtime as needed for sleep. 30 tablet 1  ? albuterol (PROVENTIL) (2.5 MG/3ML) 0.083% nebulizer solution Take 3 mLs (2.5 mg total) by nebulization every 6 (six) hours as needed for wheezing or shortness of breath. (Patient not taking: Reported on 01/28/2022) 75 mL 12  ? arformoterol (BROVANA) 15 MCG/2ML NEBU Take 2 mLs (15 mcg total) by nebulization 2 (two) times daily. (Patient  not taking: Reported on 01/27/2022) 120 mL 6  ? Budeson-Glycopyrrol-Formoterol (BREZTRI AEROSPHERE) 160-9-4.8 MCG/ACT AERO Inhale 2 puffs into the lungs in the morning and at bedtime. (Patient not taking: Repo

## 2022-01-28 NOTE — Assessment & Plan Note (Addendum)
Resolving AECOPD. Improvement in DOE with prednisone. Suspect persistent cough r/t chronic bronchitis but will check CXR to rule out superimposed infection. Educated on proper inhaler technique with teach back. Pt was able to properly use Breztri with spacer. Advised that we can trial on this and see if he has improvement in his cough/breathing; otherwise, we will need to go back to nebs. Discussed concerns with timing with his wife - feels like the three nebs take a long time and are hard for him to manage on his own. She is available in the evenings to help so they will do his Yupelri then. RE-evaluate at follow up. Advised mucociliary clearance therapies.  ? ?Patient Instructions  ?-Trial Breztri 2 puffs Twice daily with spacer. Brush tongue and rinse mouth afterwards. Please let me know if you feel better with this and we can send in a prescription.  ?-If no improvement with Breztri, please return to using your neb treatments ?     -Brovana 2 mL Twice daily  ?     -Budesonide 2 mL Twice daily. Brush tongue and rinse mouth afterwards ?     -Yupelri 3 mL once daily  ?-Continue Albuterol inhaler 2 puffs or 3 mL neb every 6 hours as needed for shortness of breath or wheezing. Notify if symptoms persist despite rescue inhaler/neb use. ?-Continue singulair 10 mg At bedtime  ? ?-Mucinex 600 mg Twice daily for cough/congestion ?-Flutter valve 2-3 times a day  ? ?Chest x ray today.  ?  ?Follow up in one month with Dr. Lamonte Sakai or Alanson Aly. If symptoms do not improve or worsen, please contact office for sooner follow up or seek emergency care. ? ? ?

## 2022-01-28 NOTE — Assessment & Plan Note (Signed)
Well-controlled with Singulair.  ?

## 2022-01-28 NOTE — Assessment & Plan Note (Signed)
Wife feels this is contributing to his difficulties with maintenance therapies and compliance. Re-educated on importance of maintenance therapies. A&Ox3 on exam today with dysarthria present, which is unchanged from baseline. Follow up with neurology as scheduled.  ?

## 2022-02-03 ENCOUNTER — Telehealth: Payer: Self-pay | Admitting: Family Medicine

## 2022-02-03 DIAGNOSIS — Z79899 Other long term (current) drug therapy: Secondary | ICD-10-CM

## 2022-02-03 NOTE — Telephone Encounter (Signed)
Patient was referred to [facility] about 2 weeks ago, however have not heard from anyone yet.  Patient's wife was just inquiring on this referral to [facility].  ?

## 2022-02-04 LAB — CUP PACEART REMOTE DEVICE CHECK
Date Time Interrogation Session: 20230504002928
Implantable Pulse Generator Implant Date: 20200928

## 2022-02-04 NOTE — Telephone Encounter (Signed)
Referral has been placed and pt wife's aware  ?

## 2022-02-08 ENCOUNTER — Ambulatory Visit (INDEPENDENT_AMBULATORY_CARE_PROVIDER_SITE_OTHER): Payer: Medicare Other

## 2022-02-08 DIAGNOSIS — I480 Paroxysmal atrial fibrillation: Secondary | ICD-10-CM | POA: Diagnosis not present

## 2022-02-11 DIAGNOSIS — E119 Type 2 diabetes mellitus without complications: Secondary | ICD-10-CM | POA: Diagnosis not present

## 2022-02-12 ENCOUNTER — Encounter: Payer: Self-pay | Admitting: Family Medicine

## 2022-02-12 MED ORDER — ALLOPURINOL 300 MG PO TABS: ORAL_TABLET | ORAL | 0 refills | Status: DC

## 2022-02-13 ENCOUNTER — Other Ambulatory Visit: Payer: Self-pay | Admitting: Family Medicine

## 2022-02-16 ENCOUNTER — Other Ambulatory Visit: Payer: Self-pay | Admitting: Family Medicine

## 2022-02-18 DIAGNOSIS — E119 Type 2 diabetes mellitus without complications: Secondary | ICD-10-CM | POA: Diagnosis not present

## 2022-02-23 ENCOUNTER — Other Ambulatory Visit: Payer: Self-pay | Admitting: Family Medicine

## 2022-02-23 DIAGNOSIS — E1142 Type 2 diabetes mellitus with diabetic polyneuropathy: Secondary | ICD-10-CM

## 2022-02-23 NOTE — Progress Notes (Signed)
[facility] Summary Report / Loop Recorder 

## 2022-03-02 ENCOUNTER — Ambulatory Visit: Payer: Medicare Other | Admitting: Nurse Practitioner

## 2022-03-02 ENCOUNTER — Encounter: Payer: Self-pay | Admitting: Nurse Practitioner

## 2022-03-02 DIAGNOSIS — J449 Chronic obstructive pulmonary disease, unspecified: Secondary | ICD-10-CM

## 2022-03-02 DIAGNOSIS — J301 Allergic rhinitis due to pollen: Secondary | ICD-10-CM | POA: Diagnosis not present

## 2022-03-02 NOTE — Assessment & Plan Note (Signed)
Well-controlled on current regimen. ?

## 2022-03-02 NOTE — Patient Instructions (Addendum)
-  Please return to using your neb treatments      -Brovana 2 mL Twice daily       -Budesonide 2 mL Twice daily. Brush tongue and rinse mouth afterwards      -Yupelri 3 mL once daily  -Continue Albuterol inhaler 2 puffs or 3 mL neb every 6 hours as needed for shortness of breath or wheezing. Notify if symptoms persist despite rescue inhaler/neb use. -Continue singulair 10 mg At bedtime  -Continue Mucinex 600 mg Twice daily for cough/congestion -Continue Flutter valve 2-3 times a day    Follow up in three months with Dr. Lamonte Sakai. If symptoms do not improve or worsen, please contact office for sooner follow up or seek emergency care.

## 2022-03-02 NOTE — Assessment & Plan Note (Signed)
Overall stable. Unable to use Breztri per pt. He has felt like the nebulizers are too complicated, which is why he stopped them before. Discussed concerns with him being off maintenance and how this can negatively impact him; glad he is currently doing well. Strongly encouraged he start neb therapies. His wife can help him in the evenings, but is unsure if they will get done in the AM.   Patient Instructions  -Please return to using your neb treatments      -Brovana 2 mL Twice daily       -Budesonide 2 mL Twice daily. Brush tongue and rinse mouth afterwards      -Yupelri 3 mL once daily  -Continue Albuterol inhaler 2 puffs or 3 mL neb every 6 hours as needed for shortness of breath or wheezing. Notify if symptoms persist despite rescue inhaler/neb use. -Continue singulair 10 mg At bedtime  -Continue Mucinex 600 mg Twice daily for cough/congestion -Continue Flutter valve 2-3 times a day    Follow up in three months with Dr. Lamonte Sakai. If symptoms do not improve or worsen, please contact office for sooner follow up or seek emergency care.

## 2022-03-02 NOTE — Progress Notes (Signed)
_0  ID: Adam [name], male    DOB: 06-06-1951, 71 y.o.   MRN: [id number]  Chief Complaint  Patient presents with   Follow-up    He is doing about the same with his breathing.     Referring provider: [name], MD  HPI: 71 year old male, former remote smoker followed for COPD/asthma. He is a patient of Dr. Agustina Caroli and last seen in office on 12/01/2021. Past medical history significant for Wernike encephalopathy, possible normal pressure hydrocephalus, thoracic aortic aneurysm, PAF on Eliquis, HTN, CAD, sleep apnea, GERD, HLD, DM II, OA, psoriasis, ETOH abuse, PTSD, gout, obesity.   TEST/EVENTS:  09/26/2015 PFTs: FVC 81, FEV1 82, ratio 75, TLC 110%, DLCOunc 90%. +BD (16%) Reversible mild obstructive airway disease  03/23/2021 echo TEE: EF 45-50%. Mild to mod MR. Mild TR.   12/01/2021: OV with Dr. Lamonte Sakai. Recently diagnosed with Wernike's, progressive, and possible normal pressure hydrocephalus. Difficulties inhaling Trelegy. Increased cough with mucous and SOB. Changed from Trelegy to triple therapy nebs.   01/13/2022: OV with Dacy Enrico NP for follow up. He has had some increased shortness of breath, progressive over the past few months but worse over the past week or two. Increased cough with occasional clear sputum. Also some associated wheezing. Has not been using neb treatments consistently. His wife feels like they are too much for him to do with his encephalopathy and take up most of his mornings to try to complete. Evaluated ability to take a deep breath in office; may be able to use HFA type inhaler with spacer. Advised we could trial Breztri but if he was unable to perform appropriate technique, we would need to return to nebs. Discussed the importance of compliance with maintenance therapy. Treated for AECOPD with pred taper.   01/28/2022: OV with Donyea Gafford NP for follow up. Resolving AECOPD. Improvement in DOE with prednisone. Suspect persistent cough r/t chronic bronchitis but  will check CXR to rule out superimposed infection, which was clear. Educated on proper inhaler technique with teach back. Pt was able to properly use Breztri with spacer. Advised that we can trial on this and see if he has improvement in his cough/breathing; otherwise, we will need to go back to nebs. Discussed concerns with timing with his wife - feels like the three nebs take a long time and are hard for him to manage on his own. She is available in the evenings to help so they will do his Yupelri then. RE-evaluate at follow up. Advised mucociliary clearance therapies.   03/02/2022: Today - follow up Patient presents today with wife for follow up. Since our last visit, he has stopped the [facility], [facility], [facility] and has not been on his nebs. He has been using mucinex Twice daily and feels like this has helped his chest congestion and cough significantly improve. Overall, feels like his breathing is stable. He feels like he doesn't need any maintenance inhaler. He denies any wheezing, hemoptysis, orthopnea, PND. He continues on singulair at bedtime. Rarely requires rescue albuterol.   Allergies  Allergen Reactions   Amoxicillin Rash and Other (See Comments)    RASH IN GROIN AREA Has patient had a PCN reaction causing immediate rash, facial/tongue/throat swelling, SOB or lightheadedness with hypotension: no Has patient had a PCN reaction causing severe rash involving mucus membranes or skin necrosis: no Has patient had a PCN reaction that required hospitalization: no Has patient had a PCN reaction occurring within the last 10 years: yes If all of the above answers  are "NO", then may proceed with Cephalosporin use.    Augmentin [Amoxicillin-Pot Clavulanate] Rash and Other (See Comments)    RASH IN GROIN AREA   Azithromycin Rash and Other (See Comments)    RASH IN GROIN AREA   Clindamycin/Lincomycin Rash   Keflex [Cephalexin] Rash   Xarelto [Rivaroxaban] Other (See Comments)    Patient stated he "ended up in the  hospital with a rectal bleed"    Immunization History  Administered Date(s) Administered   Fluad Quad(high Dose 65+) 06/25/2019, 07/18/2020, 07/10/2021   Influenza Split 07/04/2012   Influenza, High Dose Seasonal PF 05/28/2016, 06/08/2017, 06/23/2018   Influenza,inj,Quad PF,6+ Mos 07/05/2013, 05/22/2014, 06/17/2015   PFIZER(Purple Top)SARS-COV-2 Vaccination 12/08/2019, 01/08/2020, 09/28/2020   Pneumococcal Conjugate-13 12/01/2015   Pneumococcal Polysaccharide-23 10/05/2006, 11/30/2016   Td 10/06/2009   Zoster Recombinat (Shingrix) 12/02/2016, 04/02/2017   Zoster, Live 08/21/2013    Past Medical History:  Diagnosis Date   Alcohol withdrawal ([place]) 10/24/2019   Allergy    Anxiety    history of PTSD following CABG   Ascending aortic aneurysm ([place]) 01/31/2018   43 x 42 mm, pt unaware   Asthma    Cardiomegaly 10/17/2017   Colitis- colonoscopy 2014 07/13/2015   COPD (chronic obstructive pulmonary disease) ([place])    Coronary artery disease    x 6   Depression    Diabetes mellitus without complication ([place])    Family history of polyps in the colon    Finger dislocation    Left pinkie   GERD (gastroesophageal reflux disease)    Gout    H/O atrial fibrillation without current medication    following CABG with no documented episodes since then.   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 75 mL 12   allopurinol (ZYLOPRIM) 300 MG tablet TAKE 1 TABLET(300 MG) BY MOUTH DAILY 90 tablet 0   amiodarone (PACERONE) 200 MG tablet TAKE 1 TABLET(200 MG) BY MOUTH DAILY 90 tablet 1   arformoterol (BROVANA) 15 MCG/2ML NEBU Take 2 mLs (15 mcg total) by nebulization 2 (two) times daily. 120 mL 6   B COMPLEX-C-FOLIC ACID PO Take 1 tablet by mouth daily.     Blood Glucose Monitoring Suppl (ONETOUCH VERIO) w/Device KIT Use 1-4 times daily as needed/directed DX E11.9 1 kit 0   Budeson-Glycopyrrol-Formoterol (BREZTRI AEROSPHERE) 160-9-4.8 MCG/ACT AERO Inhale 2 puffs into the lungs in the morning and at bedtime. 5.9 g 0   divalproex (DEPAKOTE SPRINKLE) 125 MG capsule Take 125 mg by  mouth 3 (three) times daily.     DULoxetine (CYMBALTA) 60 MG capsule TAKE 1 CAPSULE(60 MG) BY MOUTH DAILY 90 capsule 1   ELIQUIS 5 MG TABS tablet TAKE 1 TABLET BY MOUTH TWICE DAILY 180 tablet 0   Empagliflozin-metFORMIN HCl ER (SYNJARDY XR) 25-1000 MG TB24 Take 1 tablet by mouth daily. 90 tablet 3   gabapentin (NEURONTIN) 300 MG capsule TAKE 2 CAPSULES BY MOUTH AT NIGHT AS NEEDED FOR RESTLESS LEGS OR SYMPTOMS (Patient taking differently: Take 600 mg by mouth at bedtime as needed (restless legs or symptoms).) 180 capsule 3   HM LIDOCAINE PATCH EX Apply 1 patch topically as needed (pain).     Insulin Pen Needle (NOVOFINE PEN NEEDLE) 32G X 6 MM MISC Use 1-4 times daily as needed for insulin 100 each 3   JANUVIA 100 MG tablet TAKE 1 TABLET(100 MG) BY MOUTH DAILY 90 tablet 0   Lancets (ONETOUCH ULTRASOFT) lancets Use 1-4 times daily as needed/directed  DX E11.9 200 each 12   Magnesium Oxide 400 MG CAPS Take 1 capsule (400 mg total) by  mouth daily. 90 capsule 1   metoprolol tartrate (LOPRESSOR) 50 MG tablet TAKE 1 TABLET(50 MG) BY MOUTH TWICE DAILY 60 tablet 5   montelukast (SINGULAIR) 10 MG tablet TAKE 1 TABLET BY MOUTH AT BEDTIME 90 tablet 1   ONETOUCH VERIO test strip Use 1-4 times daily as directed/needed   DX E11.9 200 each 12   revefenacin (YUPELRI) 175 MCG/3ML nebulizer solution Take 3 mLs (175 mcg total) by nebulization daily. 21 mL 0   rosuvastatin (CRESTOR) 40 MG tablet TAKE 1 TABLET(40 MG) BY MOUTH DAILY 90 tablet 1   SKYRIZI PEN 150 MG/ML SOAJ Inject into the skin. Takes every 3 months     sodium chloride (OCEAN) 0.65 % SOLN nasal spray Place 1 spray into both nostrils as needed for congestion.     Spacer/Aero-Holding Dorise Bullion Use with inhaler 1 each 1   thiamine (VITAMIN B-1) 100 MG tablet Take 500 mg by mouth daily.     TOUJEO SOLOSTAR 300 UNIT/ML Solostar Pen INJECT 60 UNITS INTO THE SKIN EVERY DAY 33 mL 3   traZODone (DESYREL) 50 MG tablet Take 0.5-1 tablets (25-50 mg total) by mouth at bedtime as needed for sleep. 30 tablet 1   No facility-administered medications prior to visit.     Review of Systems:   Constitutional: No weight loss or gain, night sweats, fevers, chills, fatigue, or lassitude. HEENT: No headaches, difficulty swallowing, tooth/dental problems, or sore throat. No sneezing, itching, ear ache, nasal congestion, or post nasal drip CV:  No chest pain, orthopnea, PND, swelling in lower extremities, anasarca, dizziness, palpitations, syncope Resp: +shortness of breath with exertion (baseline). No clear. No excess mucus or change in color of mucus.  No hemoptysis. No wheezing.  No chest wall deformity GI:  No heartburn, indigestion, abdominal pain, nausea, vomiting, diarrhea, change in bowel habits, loss of appetite, bloody stools.  Skin: No rash, lesions, ulcerations Neuro: No dizziness or lightheadedness.  Psych: No depression or anxiety. Mood stable.     Physical Exam:  BP 118/70  (BP Location: Left Arm, Cuff Size: Large)   Pulse 74   Temp 97.8 F (36.6 C) (Oral)   Ht _0  (1.778 m)   Wt 239 lb (108.4 kg)   SpO2 96%   BMI 34.29 kg/m   GEN: Pleasant, interactive, well-appearing; obese; in no acute distress. HEENT:  Normocephalic and atraumatic. PERRLA. Sclera white. Nasal turbinates pink,  moist and patent bilaterally. No rhinorrhea present. Oropharynx pink and moist, without exudate or edema. No lesions, ulcerations, or postnasal drip.  NECK:  Supple w/ fair ROM. No JVD present. Normal carotid impulses w/o bruits. Thyroid symmetrical with no goiter or nodules palpated. No lymphadenopathy.   CV: RRR, no m/r/g, no peripheral edema. Pulses intact, +2 bilaterally. No cyanosis, pallor or clubbing. PULMONARY:  Unlabored, regular breathing. Clear breath sounds bilaterally A&P without wheezes/rhonchi. No accessory muscle use. No dullness to percussion. GI: BS present and normoactive. Soft, non-tender to palpation.  Neuro: A/Ox3. Dysarthria; no other focal deficits noted  Skin: Warm, no lesions or rashe Psych: Normal affect and behavior. Judgement and thought content appropriate.     Lab Results:  CBC    Component Value Date/Time   WBC 6.8 08/05/2021 1557   RBC 4.08 (L) 08/05/2021 1557   HGB 13.7 08/05/2021 1557   HGB 10.5 (L) 06/05/2020 1539   HCT 40.6 08/05/2021 1557   HCT 32.0 (L) 06/05/2020 1539   PLT 283.0 08/05/2021 1557   PLT 203 06/05/2020 1539   MCV 99.5 08/05/2021 1557   MCV 97 06/05/2020 1539   MCH 33.2 08/01/2021 0909   MCHC 33.7 08/05/2021 1557   RDW 17.6 (H) 08/05/2021 1557   RDW 13.6 06/05/2020 1539   LYMPHSABS 1.6 08/05/2021 1557   LYMPHSABS 1.7 06/05/2020 1539   MONOABS 1.2 (H) 08/05/2021 1557   EOSABS 0.2 08/05/2021 1557   EOSABS 0.3 06/05/2020 1539   BASOSABS 0.0 08/05/2021 1557   BASOSABS 0.1 06/05/2020 1539    BMET    Component Value Date/Time   NA 141 01/27/2022 1216   NA 143 12/08/2020 1459   K 4.0 01/27/2022 1216   CL 106  01/27/2022 1216   CO2 26 01/27/2022 1216   GLUCOSE 109 (H) 01/27/2022 1216   BUN 12 01/27/2022 1216   BUN 12 12/08/2020 1459   CREATININE 1.01 01/27/2022 1216   CREATININE 0.93 09/23/2020 1216   CALCIUM 9.0 01/27/2022 1216   GFRNONAA >60 01/27/2022 1216   GFRAA 115 08/22/2020 0921    BNP    Component Value Date/Time   BNP 429.5 (H) 04/05/2021 0220     Imaging:  CUP PACEART REMOTE DEVICE CHECK  Result Date: 02/04/2022 ILR summary report received. Battery status OK. Normal device function. No new symptom, tachy, brady, or pause episodes. No new AF episodes.  AF burden is 0% of the time.   Monthly summary reports and ROV/PRN Kathy Breach, RN, CCDS, [employer]        Latest Ref Rng & Units 09/26/2015    2:31 PM  PFT Results  FVC-Pre L 3.57    FVC-Predicted Pre % 74    FVC-Post L 3.90    FVC-Predicted Post % 81    Pre FEV1/FVC % % 71    Post FEV1/FCV % % 75    FEV1-Pre L 2.53    FEV1-Predicted Pre % 70    FEV1-Post L 2.93    DLCO uncorrected ml/min/mmHg 30.57    DLCO UNC% % 90    DLVA Predicted % 106    TLC L 7.97    TLC % Predicted % 110    RV % Predicted % 118      Lab Results  Component Value Date   NITRICOXIDE 7 10/17/2017        Assessment & Plan:   COPD with asthma ([place]) Overall stable. Unable to use Breztri per pt. He has felt like the nebulizers  are too complicated, which is why he stopped them before. Discussed concerns with him being off maintenance and how this can negatively impact him; glad he is currently doing well. Strongly encouraged he start neb therapies. His wife can help him in the evenings, but is unsure if they will get done in the AM.   Patient Instructions  -Please return to using your neb treatments      -Brovana 2 mL Twice daily       -Budesonide 2 mL Twice daily. Brush tongue and rinse mouth afterwards      -Yupelri 3 mL once daily  -Continue Albuterol inhaler 2 puffs or 3 mL neb every 6 hours as needed for shortness  of breath or wheezing. Notify if symptoms persist despite rescue inhaler/neb use. -Continue singulair 10 mg At bedtime  -Continue Mucinex 600 mg Twice daily for cough/congestion -Continue Flutter valve 2-3 times a day    Follow up in three months with Dr. Lamonte Sakai. If symptoms do not improve or worsen, please contact office for sooner follow up or seek emergency care.    Allergic rhinitis Well-controlled on current regimen.    I spent 28 minutes of dedicated to the care of this patient on the date of this encounter to include pre-visit review of records, face-to-face time with the patient discussing conditions above, post visit ordering of testing, clinical documentation with the electronic health record, making appropriate referrals as documented, and communicating necessary findings to members of the patients care team.  Clayton Bibles, NP 03/02/2022  Pt aware and understands NP's role.

## 2022-03-14 DIAGNOSIS — E119 Type 2 diabetes mellitus without complications: Secondary | ICD-10-CM | POA: Diagnosis not present

## 2022-03-14 LAB — CUP PACEART REMOTE DEVICE CHECK
Date Time Interrogation Session: 20230606004142
Implantable Pulse Generator Implant Date: 20200928

## 2022-03-15 ENCOUNTER — Ambulatory Visit (INDEPENDENT_AMBULATORY_CARE_PROVIDER_SITE_OTHER): Payer: Medicare Other

## 2022-03-15 DIAGNOSIS — I480 Paroxysmal atrial fibrillation: Secondary | ICD-10-CM

## 2022-03-21 DIAGNOSIS — E119 Type 2 diabetes mellitus without complications: Secondary | ICD-10-CM | POA: Diagnosis not present

## 2022-03-26 ENCOUNTER — Other Ambulatory Visit: Payer: Self-pay | Admitting: Family Medicine

## 2022-03-29 ENCOUNTER — Other Ambulatory Visit: Payer: Self-pay | Admitting: Family Medicine

## 2022-04-08 NOTE — Progress Notes (Signed)
[facility] Summary Report / Loop Recorder 

## 2022-04-11 LAB — CUP PACEART REMOTE DEVICE CHECK
Date Time Interrogation Session: 20230709004310
Implantable Pulse Generator Implant Date: 20200928

## 2022-04-13 DIAGNOSIS — E119 Type 2 diabetes mellitus without complications: Secondary | ICD-10-CM | POA: Diagnosis not present

## 2022-04-19 ENCOUNTER — Ambulatory Visit (INDEPENDENT_AMBULATORY_CARE_PROVIDER_SITE_OTHER): Payer: Medicare Other

## 2022-04-19 DIAGNOSIS — I480 Paroxysmal atrial fibrillation: Secondary | ICD-10-CM

## 2022-04-20 DIAGNOSIS — E119 Type 2 diabetes mellitus without complications: Secondary | ICD-10-CM | POA: Diagnosis not present

## 2022-04-21 ENCOUNTER — Encounter: Payer: Self-pay | Admitting: Family Medicine

## 2022-04-21 ENCOUNTER — Ambulatory Visit (INDEPENDENT_AMBULATORY_CARE_PROVIDER_SITE_OTHER): Payer: Medicare Other | Admitting: Family Medicine

## 2022-04-21 VITALS — BP 140/70 | HR 75 | Temp 97.6°F | Ht 70.0 in | Wt 231.0 lb

## 2022-04-21 DIAGNOSIS — E1142 Type 2 diabetes mellitus with diabetic polyneuropathy: Secondary | ICD-10-CM | POA: Diagnosis not present

## 2022-04-21 DIAGNOSIS — I2510 Atherosclerotic heart disease of native coronary artery without angina pectoris: Secondary | ICD-10-CM | POA: Diagnosis not present

## 2022-04-21 DIAGNOSIS — E512 Wernicke's encephalopathy: Secondary | ICD-10-CM | POA: Diagnosis not present

## 2022-04-21 DIAGNOSIS — F1010 Alcohol abuse, uncomplicated: Secondary | ICD-10-CM

## 2022-04-21 LAB — POCT GLYCOSYLATED HEMOGLOBIN (HGB A1C): Hemoglobin A1C: 5.6 % (ref 4.0–5.6)

## 2022-04-21 NOTE — Progress Notes (Signed)
Established Patient Office Visit  Subjective   Patient ID: Adam [name], male    DOB: 04-Sep-1951  Age: 71 y.o. MRN: [id number]  Chief Complaint  Patient presents with   Follow-up    HPI   [name] is here accompanied by wife for medical follow-up.  He has longstanding history of alcohol abuse and is still drinking and has no plans to quit.  History of Wernicke encephalopathy.  His speech has continued to decline.  He has history of atrial fibrillation, hypertension, CAD, sleep apnea, COPD, GERD, type 2 diabetes, gout.  Recently saw pulmonary.  He has some chronic productive cough and has been unable to use regular inhalers.  He was prescribed medication with nebulizer including Brovana and revefenacin but has not yet gotten that filled.  He has lost 14 pounds since last visit here in April.  Wife states he is not eating well.  He has several teeth removed.  Eating mostly soft foods.  They did get prescription for Dexcom but have not yet set that up.  They apparently have not been able to set up follow-up with pharmacist yet.  Last visit we scaled back his Toujeo is still on 60 units daily.  Also takes [place].  No recent hypoglycemic symptoms.  Past Medical History:  Diagnosis Date   Alcohol withdrawal ([place]) 10/24/2019   Allergy    Anxiety    history of PTSD following CABG   Ascending aortic aneurysm ([place]) 01/31/2018   43 x 42 mm, pt unaware   Asthma    Cardiomegaly 10/17/2017   Colitis- colonoscopy 2014 07/13/2015   COPD (chronic obstructive pulmonary disease) ([place])    Coronary artery disease    x 6   Depression    Diabetes mellitus without complication ([place])    Family history of polyps in the colon    Finger dislocation    Left pinkie   GERD (gastroesophageal reflux disease)    Gout    H/O atrial fibrillation without current medication    following CABG with no documented episodes since then.   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   Past Surgical History:  Procedure Laterality Date   ANKLE FRACTURE SURGERY Right 1991   APPENDECTOMY     ATRIAL FIBRILLATION ABLATION N/A 03/31/2018   Procedure: ATRIAL FIBRILLATION ABLATION;  Surgeon: [name], MD;  Location: [facility];  Service: Cardiovascular;  Laterality: N/A;   CARDIAC ELECTROPHYSIOLOGY MAPPING AND ABLATION     CARDIOVERSION N/A 03/23/2021   Procedure: CARDIOVERSION;  Surgeon: Acie Fredrickson Wonda Cheng, MD;  Location: [facility];  Service: Cardiovascular;  Laterality: N/A;   COLONOSCOPY  06/11/2021   COLONOSCOPY W/ BIOPSIES  2017   x7   CORONARY ANGIOPLASTY WITH STENT PLACEMENT     CORONARY ARTERY BYPASS GRAFT     FINGER SURGERY  04/2018   Small finger left hand   implantable loop recorder placement  07/02/2019   Medtronic Reveal [place] model LNQ11 (SN [id number]) implanted in office by Dr Rayann Heman   TEE WITHOUT CARDIOVERSION N/A 03/30/2018   Procedure: TRANSESOPHAGEAL ECHOCARDIOGRAM (TEE);  Surgeon: [name], MD;  Location: [facility];  Service: Cardiovascular;  Laterality: N/A;   TEE WITHOUT CARDIOVERSION N/A 03/23/2021   Procedure: TRANSESOPHAGEAL ECHOCARDIOGRAM (TEE);  Surgeon: Acie Fredrickson Wonda Cheng, MD;  [facility];  Service: Cardiovascular;  Laterality: N/A;   TOTAL HIP ARTHROPLASTY Left    TOTAL HIP ARTHROPLASTY Right 09/12/2018   Procedure: TOTAL HIP ARTHROPLASTY ANTERIOR APPROACH;  Surgeon: [name], MD;  Location: [facility];  Service: Orthopedics;  Laterality: Right;  70 mins    reports that he quit smoking about 44 years ago. His smoking use included cigarettes. He has a 7.00 pack-year smoking history. He has never used smokeless tobacco. He reports current  alcohol use of about 3.0 - 4.0 standard drinks of alcohol per week. He reports that he does not use drugs. family history includes Cancer in his father; Colon cancer in his mother; Heart disease in his paternal grandfather and paternal grandmother. Allergies  Allergen Reactions   Amoxicillin Rash and Other (See Comments)    RASH IN GROIN AREA Has patient had a PCN reaction causing immediate rash, facial/tongue/throat swelling, SOB or lightheadedness with hypotension: no Has patient had a PCN reaction causing severe rash involving mucus membranes or skin necrosis: no Has patient had a PCN reaction that required hospitalization: no Has patient had a PCN reaction occurring within the last 10 years: yes If all of the above answers are "NO", then may proceed with Cephalosporin use.    Augmentin [Amoxicillin-Pot Clavulanate] Rash and Other (See Comments)    RASH IN GROIN AREA   Azithromycin Rash and Other (See Comments)    RASH IN GROIN AREA   Clindamycin/Lincomycin Rash   Keflex [Cephalexin] Rash   Xarelto [Rivaroxaban] Other (See Comments)    Patient stated he "ended up in the hospital with a rectal bleed"    Review of Systems  Constitutional:  Negative for chills and fever.  Respiratory:  Positive for cough. Negative for hemoptysis.   Cardiovascular:  Negative for chest pain.  Gastrointestinal:  Negative for abdominal pain.  Genitourinary:  Negative for dysuria.  Neurological:  Negative for dizziness.      Objective:     BP 140/70 (BP Location: Left Arm, Patient Position: Sitting, Cuff Size: Normal)   Pulse 75   Temp 97.6 F (36.4 C) (Oral)   Ht '5\' 10"'$  (1.778 m)   Wt 231 lb (104.8 kg)   SpO2 98%   BMI 33.15 kg/m    Physical Exam Vitals reviewed. Nursing note reviewed: Very difficult to understand because of impaired speech. Cardiovascular:     Rate and Rhythm: Normal rate.  Pulmonary:     Effort: Pulmonary effort is normal. No respiratory distress.     Breath sounds:  No wheezing.     Comments: Coarse breath sounds throughout.  Pulse oximetry 98% room air.  No respiratory distress. Musculoskeletal:     Right lower leg: No edema.     Left lower leg: No edema.  Neurological:     Mental Status: He is alert.      Results for orders placed or performed in visit on 04/21/22  POCT glycosylated hemoglobin (Hb A1C)  Result Value Ref Range   Hemoglobin A1C 5.6 4.0 - 5.6 %   HbA1c POC (<> result, manual entry)     HbA1c, POC (prediabetic range)     HbA1c, POC (controlled diabetic range)        The 10-year ASCVD risk score (Arnett DK, et al., 2019) is: 40.7%    Assessment & Plan:   #1 type 2  diabetes well controlled with A1c today 5.6%.  Discontinue Januvia. -We are setting up follow-up with pharmacist to go over Dexcom -Would recommend further tapering of Toujeo hopefully after we get Dexcom in place to monitor more closely. -Set up 102-monthfollow-up  #2 longstanding history of alcohol abuse.  Patient has not restricted alcohol whatsoever.  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  Check fasting lipids at next follow-up in 3 months   Return in about 3 months (around 07/22/2022).    BCarolann Littler MD

## 2022-04-21 NOTE — Patient Instructions (Signed)
STOP the Januvia  Get the Dexcom system up to monitor and hopefully we can scale back the Toujeo soon.

## 2022-04-27 ENCOUNTER — Telehealth: Payer: Self-pay | Admitting: Pharmacist

## 2022-04-27 NOTE — Chronic Care Management (AMB) (Signed)
  Chronic Care Management Pharmacy Assistant   Name: Bion D Whittley  MRN: [id number] DOB: 10/25/1950 04/27/22 APPOINTMENT REMINDER   Patient's wife was reminded to have all medications, supplements and any blood glucose and blood pressure readings available for review with Madeline Pryor, Pharm. D, for office visit on 04/28/22 at 11:30.    Care Gaps: Foot Exam - Overdue COVID Booster - Overdue Eye Exam - Overdue TDAP - Overdue BP- 140/70 04/21/22 AWV- 3/22 Lab Results  Component Value Date   HGBA1C 5.6 04/21/2022    Star Rating Drug: Empagliflozin-metformin (Synjardy) 25 mg - Last filled 12/24/21 90 DS at [facility] Rosuvastatin 40 mg - Last filled 01/05/22 90 DS at [facility]  Verified     Medications: Outpatient Encounter Medications as of 04/27/2022  Medication Sig   albuterol (PROVENTIL) (2.5 MG/3ML) 0.083% nebulizer solution Take 3 mLs (2.5 mg total) by nebulization every 6 (six) hours as needed for wheezing or shortness of breath.   allopurinol (ZYLOPRIM) 300 MG tablet TAKE 1 TABLET(300 MG) BY MOUTH DAILY   amiodarone (PACERONE) 200 MG tablet TAKE 1 TABLET(200 MG) BY MOUTH DAILY   arformoterol (BROVANA) 15 MCG/2ML NEBU Take 2 mLs (15 mcg total) by nebulization 2 (two) times daily.   B COMPLEX-C-FOLIC ACID PO Take 1 tablet by mouth daily.   Blood Glucose Monitoring Suppl (ONETOUCH VERIO) w/Device KIT Use 1-4 times daily as needed/directed DX E11.9   Budeson-Glycopyrrol-Formoterol (BREZTRI AEROSPHERE) 160-9-4.8 MCG/ACT AERO Inhale 2 puffs into the lungs in the morning and at bedtime.   divalproex (DEPAKOTE SPRINKLE) 125 MG capsule Take 125 mg by mouth 3 (three) times daily.   DULoxetine (CYMBALTA) 60 MG capsule TAKE 1 CAPSULE(60 MG) BY MOUTH DAILY   ELIQUIS 5 MG TABS tablet TAKE 1 TABLET BY MOUTH TWICE DAILY   Empagliflozin-metFORMIN HCl ER (SYNJARDY XR) 25-1000 MG TB24 Take 1 tablet by mouth daily.   gabapentin (NEURONTIN) 300 MG capsule TAKE 2 CAPSULES BY MOUTH AT NIGHT  AS NEEDED FOR RESTLESS LEGS OR SYMPTOMS (Patient taking differently: Take 600 mg by mouth at bedtime as needed (restless legs or symptoms).)   HM LIDOCAINE PATCH EX Apply 1 patch topically as needed (pain).   Insulin Pen Needle (NOVOFINE PEN NEEDLE) 32G X 6 MM MISC Use 1-4 times daily as needed for insulin   Lancets (ONETOUCH ULTRASOFT) lancets Use 1-4 times daily as needed/directed  DX E11.9   Magnesium Oxide 400 MG CAPS Take 1 capsule (400 mg total) by mouth daily.   metoprolol tartrate (LOPRESSOR) 50 MG tablet TAKE 1 TABLET(50 MG) BY MOUTH TWICE DAILY   montelukast (SINGULAIR) 10 MG tablet TAKE 1 TABLET BY MOUTH AT BEDTIME   ONETOUCH VERIO test strip Use 1-4 times daily as directed/needed   DX E11.9   revefenacin (YUPELRI) 175 MCG/3ML nebulizer solution Take 3 mLs (175 mcg total) by nebulization daily.   rosuvastatin (CRESTOR) 40 MG tablet TAKE 1 TABLET(40 MG) BY MOUTH DAILY   SKYRIZI PEN 150 MG/ML SOAJ Inject into the skin. Takes every 3 months   sodium chloride (OCEAN) 0.65 % SOLN nasal spray Place 1 spray into both nostrils as needed for congestion.   Spacer/Aero-Holding Chambers DEVI Use with inhaler   thiamine (VITAMIN B-1) 100 MG tablet Take 500 mg by mouth daily.   TOUJEO SOLOSTAR 300 UNIT/ML Solostar Pen INJECT 60 UNITS INTO THE SKIN EVERY DAY   traZODone (DESYREL) 50 MG tablet TAKE 1/2 TO 1 TABLET(25 TO 50 MG) BY MOUTH AT BEDTIME AS NEEDED FOR SLEEP     No facility-administered encounter medications on file as of 04/27/2022.       [facility] Clinical Pharmacist Assistant [phone]

## 2022-04-28 ENCOUNTER — Ambulatory Visit (INDEPENDENT_AMBULATORY_CARE_PROVIDER_SITE_OTHER): Payer: Medicare Other | Admitting: Pharmacist

## 2022-04-28 DIAGNOSIS — J449 Chronic obstructive pulmonary disease, unspecified: Secondary | ICD-10-CM

## 2022-04-28 DIAGNOSIS — E1142 Type 2 diabetes mellitus with diabetic polyneuropathy: Secondary | ICD-10-CM

## 2022-04-28 NOTE — Progress Notes (Signed)
Chronic Care Management Pharmacy Note  04/28/2022 Name:  Adam [name] MRN:  [id number] DOB:  [birth year]  Summary: A1c at goal < 7% but pt is overbasalized  Pt is not checking BGs at home but presented for Dexcom instructions Pt is still not using nebulizer medications and is unsure if he has them all at home  Recommendations/Changes made from today's visit: -Demonstrated proper use and placement of Dexcom G7 sensor -Recommended bringing nebs with him to next visit to discuss proper use  -Consider switching to [facility] for adherence packaging based on insurance preferences   Plan: Follow up in 1 month for Dexcom report and COPD follow up   Subjective: Adam [name] is an 71 y.o. year old male who is a primary patient of [name], Alinda Sierras, MD.  The CCM team was consulted for assistance with disease management and care coordination needs.    Engaged with patient face to face for follow up visit in response to provider referral for pharmacy case management and/or care coordination services.   Consent to Services:  The patient was given information about Chronic Care Management services, agreed to services, and gave verbal consent prior to initiation of services.  Please see initial visit note for detailed documentation.   Patient Care Team: [name], MD as PCP - General (Family Medicine) [name], MD as PCP - Electrophysiology (Cardiology) Sueanne Margarita, MD as PCP - Sleep Medicine (Sleep Medicine) [name], MD as PCP - Cardiology (Cardiology) Domenic Polite, MD as Referring Physician (Internal Medicine) Viona Gilmore, [employer] as Pharmacist (Pharmacist)  Recent office visits: 04/21/22 [name], MD: Patient presented for Controlled type 2 diabetes with polyneuropathy. D/c'd Januvia. Follow up in 3 months.  01/20/22 [name], Alinda Sierras, MD: Patient presented for Controlled type 2 diabetes with polyneuropathy. Decreased Toujeo to 60 units  daily. Follow up in 3 months.  12/30/21 Rolene Arbour, LPN: Patient presented for AWV.  Recent consult visits: 03/02/22 Marland Kitchen, NP (Pulmonology): Patient presented for COPD with asthma follow up. Patient has stopping using the [facility] since last visit and unable to use the nebulizers as he feels these are too complicated. Recommended restarting Brovana, Budesonide and Yulpelri.  01/28/22 Marland Kitchen, NP (Pulmonology): Patient presented for COPD with asthma follow up. Patient did not use Breztri as he was unable to. Recommended trial of Beztri with spacer.  01/27/22 Roderic Palau, NP (cardiology): Patient presented for Afib follow up.  Follow up in 6 months.  01/13/22 Marland Kitchen, NP (Pulmonology): Patient presented for cough and COPD follow up.  Trial Breztri BID with spacer. Prescribed prednisone taper.  12/01/21 [name], MD (Pulmonology) - Patient presented for COPD with acute exacerbation. Prescribed Arformoterol Tartrate, Budesonide &  Revefenacin   09/24/21 Patient presented to [facility] for Modified Barium Swallow.   08/10/21 [name], MD (Neurology) - Patient presented for Alcohol abuse and other concerns. No medication changes.   07/30/21 [name], NP (Cardiology) - Patient presented for Paroxysmal Atrial fibrillation and other concerns. No medication changes.   06/11/21 Patient presented for Colonoscopy.  Hospital visits: Medication Reconciliation was completed by comparing discharge summary, patient's EMR and Pharmacy list, and upon discussion with patient.   Patient presented to [facility] on 07/30/21 due to Hypokalemia. Patient was present for 3 days.   New?Medications Started at [facility] Discharge:?? -started  none   Medication Changes at Hospital Discharge: -Changed  none   Medications Discontinued at  Hospital Discharge: -Stopped  none   Medications that remain the same after Hospital Discharge:??   -All other medications will remain the same.    Objective:  Lab Results  Component Value Date   CREATININE 1.01 01/27/2022   BUN 12 01/27/2022   GFR 70.80 08/05/2021   EGFR 96 12/08/2020   GFRNONAA >60 01/27/2022   GFRAA 115 08/22/2020   NA 141 01/27/2022   K 4.0 01/27/2022   CALCIUM 9.0 01/27/2022   CO2 26 01/27/2022   GLUCOSE 109 (H) 01/27/2022    Lab Results  Component Value Date/Time   HGBA1C 5.6 04/21/2022 11:06 AM   HGBA1C 5.8 (A) 01/20/2022 11:40 AM   HGBA1C 5.7 (H) 03/20/2021 04:50 PM   HGBA1C 6.4 12/22/2020 11:16 AM   HGBA1C 10.1 (H) 09/23/2020 12:16 PM   GFR 70.80 08/05/2021 03:57 PM   GFR 75.74 12/03/2019 03:02 PM   MICROALBUR 2.6 (H) 06/22/2016 11:05 AM   MICROALBUR 0.7 06/12/2015 10:08 AM    Last diabetic Eye exam:  Lab Results  Component Value Date/Time   HMDIABEYEEXA No Retinopathy 09/01/2020 12:00 AM    Last diabetic Foot exam: No results found for: "HMDIABFOOTEX"   Lab Results  Component Value Date   CHOL 137 09/23/2020   HDL 40 09/23/2020   LDLCALC 64 09/23/2020   LDLDIRECT 124.0 06/23/2018   TRIG 280 (H) 09/23/2020   CHOLHDL 3.4 09/23/2020       Latest Ref Rng & Units 01/27/2022   12:16 PM 07/30/2021    4:42 PM 07/30/2021    1:39 PM  Hepatic Function  Total Protein 6.5 - 8.1 g/dL 6.7  6.9  6.9   Albumin 3.5 - 5.0 g/dL 3.5  3.9  3.4   AST 15 - 41 U/L 39  28  39   ALT 0 - 44 U/L 32  16  22   Alk Phosphatase 38 - 126 U/L 48  48  49   Total Bilirubin 0.3 - 1.2 mg/dL 1.0  0.9  1.1     Lab Results  Component Value Date/Time   TSH 1.652 01/27/2022 12:17 PM   TSH 1.984 07/30/2021 01:40 PM   TSH 1.32 12/29/2018 11:01 AM   TSH 2.87 06/15/2017 10:05 AM       Latest Ref Rng & Units 08/05/2021    3:57 PM 08/01/2021    9:09 AM 07/30/2021    4:42 PM  CBC  WBC 4.0 - 10.5 K/uL 6.8  5.7  6.5   Hemoglobin 13.0 - 17.0 g/dL 13.7  11.9  13.0   Hematocrit 39.0 - 52.0 % 40.6  34.4  37.3   Platelets 150.0 - 400.0 K/uL 283.0  187  218     No  results found for: "VD25OH"  Clinical ASCVD: Yes  The 10-year ASCVD risk score (Arnett DK, et al., 2019) is: 40.7%   Values used to calculate the score:     Age: 71 years     Sex: Male     Is Non-Hispanic African American: No     Diabetic: Yes     Tobacco smoker: No     Systolic Blood Pressure: 161 mmHg     Is BP treated: Yes     HDL Cholesterol: 40 mg/dL     Total Cholesterol: 137 mg/dL       12/30/2021    4:00 PM 12/24/2020   11:40 AM 11/20/2019   10:48 AM  Depression screen PHQ 2/9  Decreased Interest 0 0 2  Down, Depressed,  Hopeless 0 0 2  PHQ - 2 Score 0 0 4  Altered sleeping   1  Tired, decreased energy   2  Change in appetite   0  Feeling bad or failure about yourself    0  Trouble concentrating   0  Moving slowly or fidgety/restless   0  Suicidal thoughts   0  PHQ-9 Score   7    CHA2DS2/VAS Stroke Risk Points  Current as of 24 minutes ago     5 >= 2 Points: High Risk  1 - 1.99 Points: Medium Risk  0 Points: Low Risk    No Change      Details    This score determines the patient's risk of having a stroke if the  patient has atrial fibrillation.       Points Metrics  1 Has Congestive Heart Failure:  Yes    Current as of 24 minutes ago  1 Has Vascular Disease:  Yes    Current as of 24 minutes ago  1 Has Hypertension:  Yes    Current as of 24 minutes ago  1 Age:  75    Current as of 24 minutes ago  1 Has Diabetes:  Yes    Current as of 24 minutes ago  0 Had Stroke:  No  Had TIA:  No  Had Thromboembolism:  No    Current as of 24 minutes ago  0 Male:  No    Current as of 24 minutes ago       Social History   Tobacco Use  Smoking Status Former   Packs/day: 1.00   Years: 7.00   Total pack years: 7.00   Types: Cigarettes   Quit date: 10/04/1977   Years since quitting: 44.5  Smokeless Tobacco Never  Tobacco Comments   never smoked over 1 pack    BP Readings from Last 3 Encounters:  04/21/22 140/70  03/02/22 118/70  01/28/22 118/72   Pulse  Readings from Last 3 Encounters:  04/21/22 75  03/02/22 74  01/28/22 78   Wt Readings from Last 3 Encounters:  04/21/22 231 lb (104.8 kg)  03/02/22 239 lb (108.4 kg)  01/28/22 243 lb 12.8 oz (110.6 kg)   BMI Readings from Last 3 Encounters:  04/21/22 33.15 kg/m  03/02/22 34.29 kg/m  01/28/22 34.98 kg/m    Assessment/Interventions: Review of patient past medical history, allergies, medications, health status, including review of consultants reports, laboratory and other test data, was performed as part of comprehensive evaluation and provision of chronic care management services.   SDOH:  (Social Determinants of Health) assessments and interventions performed: Yes (last done 12/07/21)   SDOH Screenings   Alcohol Screen: Medium Risk (12/30/2021)   Alcohol Screen    Last Alcohol Screening Score (AUDIT): 19  Depression (PHQ2-9): Low Risk  (12/30/2021)   Depression (PHQ2-9)    PHQ-2 Score: 0  Financial Resource Strain: Low Risk  (12/30/2021)   Overall Financial Resource Strain (CARDIA)    Difficulty of Paying Living Expenses: Not hard at all  Food Insecurity: No Food Insecurity (12/30/2021)   Hunger Vital Sign    Worried About Running Out of Food in the Last Year: Never true    Ran Out of Food in the Last Year: Never true  Housing: Low Risk  (12/24/2020)   Housing    Last Housing Risk Score: 0  Physical Activity: Inactive (12/30/2021)   Exercise Vital Sign    Days of Exercise per Week:  0 days    Minutes of Exercise per Session: 0 min  Social Connections: Moderately Isolated (12/30/2021)   Social Connection and Isolation Panel [NHANES]    Frequency of Communication with Friends and Family: More than three times a week    Frequency of Social Gatherings with Friends and Family: More than three times a week    Attends Religious Services: Never    Marine scientist or Organizations: No    Attends Archivist Meetings: Never    Marital Status: Married  Stress: No  Stress Concern Present (12/30/2021)   [employer] [place] of Stress : Not at all  Tobacco Use: Medium Risk (04/21/2022)   Patient History    Smoking Tobacco Use: Former    Smokeless Tobacco Use: Never    Passive Exposure: Not on file  Transportation Needs: No Transportation Needs (12/30/2021)   PRAPARE - Hydrologist (Medical): No    Lack of Transportation (Non-Medical): No     CCM Care Plan  Allergies  Allergen Reactions   Amoxicillin Rash and Other (See Comments)    RASH IN GROIN AREA Has patient had a PCN reaction causing immediate rash, facial/tongue/throat swelling, SOB or lightheadedness with hypotension: no Has patient had a PCN reaction causing severe rash involving mucus membranes or skin necrosis: no Has patient had a PCN reaction that required hospitalization: no Has patient had a PCN reaction occurring within the last 10 years: yes If all of the above answers are "NO", then may proceed with Cephalosporin use.    Augmentin [Amoxicillin-Pot Clavulanate] Rash and Other (See Comments)    RASH IN GROIN AREA   Azithromycin Rash and Other (See Comments)    RASH IN GROIN AREA   Clindamycin/Lincomycin Rash   Keflex [Cephalexin] Rash   Xarelto [Rivaroxaban] Other (See Comments)    Patient stated he "ended up in the hospital with a rectal bleed"    Medications Reviewed Today     Reviewed by [name], MD (Physician) on 04/21/22 at 1118  Med List Status: <None>   Medication Order Taking? Sig Documenting Provider Last Dose Status Informant  albuterol (PROVENTIL) (2.5 MG/3ML) 0.083% nebulizer solution [id number] Yes Take 3 mLs (2.5 mg total) by nebulization every 6 (six) hours as needed for wheezing or shortness of breath. Clayton Bibles, NP Taking Active   allopurinol (ZYLOPRIM) 300 MG tablet [id number] Yes TAKE 1 TABLET(300 MG) BY MOUTH DAILY [name], Alinda Sierras, MD Taking  Active   amiodarone (PACERONE) 200 MG tablet [id number] Yes TAKE 1 TABLET(200 MG) BY MOUTH DAILY [name], NP Taking Active   arformoterol [facility]) 15 MCG/2ML NEBU [id number] Yes Take 2 mLs (15 mcg total) by nebulization 2 (two) times daily. [name], MD Taking Active   B COMPLEX-C-FOLIC ACID PO [id number] Yes Take 1 tablet by mouth daily. [provider] Taking Active Self  Blood Glucose Monitoring Suppl [facility] VERIO) w/Device KIT [id number] Yes Use 1-4 times daily as needed/directed DX E11.9 [name], MD Taking Active Self  Budeson-Glycopyrrol-Formoterol (BREZTRI AEROSPHERE) 160-9-4.8 MCG/ACT AERO [id number] Yes Inhale 2 puffs into the lungs in the morning and at bedtime. Clayton Bibles, NP Taking Active   divalproex (DEPAKOTE SPRINKLE) 125 MG capsule [id number] Yes Take 125 mg by mouth 3 (three) times daily. [provider] Taking Active Self  DULoxetine (CYMBALTA) 60 MG capsule [id number] Yes TAKE 1 CAPSULE(60 MG) BY MOUTH DAILY  [name], MD Taking Active   ELIQUIS 5 MG TABS tablet [id number] Yes TAKE 1 TABLET BY MOUTH TWICE DAILY [name], Alinda Sierras, MD Taking Active   Empagliflozin-metFORMIN HCl ER (SYNJARDY XR) 25-1000 MG TB24 [id number] Yes Take 1 tablet by mouth daily. [name], MD Taking Active   gabapentin (NEURONTIN) 300 MG capsule [id number] Yes TAKE 2 CAPSULES BY MOUTH AT NIGHT AS NEEDED FOR RESTLESS LEGS OR SYMPTOMS  Patient taking differently: Take 600 mg by mouth at bedtime as needed (restless legs or symptoms).   [name], MD Taking Active   HM LIDOCAINE [facility] [id number] Yes Apply 1 patch topically as needed (pain). [provider] Taking Active Self  Insulin Pen Needle (NOVOFINE PEN NEEDLE) 32G X 6 MM MISC [id number] Yes Use 1-4 times daily as needed for insulin [name], MD Taking Active Self  Discontinued 04/21/22 1118   Lancets (ONETOUCH ULTRASOFT) lancets [id number] Yes Use 1-4 times daily  as needed/directed  DX E11.9 [name], MD Taking Active Self  Magnesium Oxide 400 MG CAPS [id number] Yes Take 1 capsule (400 mg total) by mouth daily. [name], MD Taking Active   metoprolol tartrate (LOPRESSOR) 50 MG tablet [id number] Yes TAKE 1 TABLET(50 MG) BY MOUTH TWICE DAILY [name], NP Taking Active   montelukast (SINGULAIR) 10 MG tablet [id number] Yes TAKE 1 TABLET BY MOUTH AT BEDTIME [name], MD Taking Active   [facility] VERIO test strip [id number] Yes Use 1-4 times daily as directed/needed   DX E11.9 [name], MD Taking Active Self  revefenacin (YUPELRI) 175 MCG/3ML nebulizer solution [id number] Yes Take 3 mLs (175 mcg total) by nebulization daily. [name], MD Taking Active   rosuvastatin (CRESTOR) 40 MG tablet [id number] Yes TAKE 1 TABLET(40 MG) BY MOUTH DAILY [name], MD Taking Active   SKYRIZI PEN 150 MG/ML SOAJ [id number] Yes Inject into the skin. Takes every 3 months [provider] Taking Active   sodium chloride (OCEAN) 0.65 % SOLN nasal spray [id number] Yes Place 1 spray into both nostrils as needed for congestion. [provider] Taking Active   Spacer/Aero-Holding Dorise Bullion [id number] Yes Use with inhaler Clayton Bibles, NP Taking Active   thiamine (VITAMIN B-1) 100 MG tablet [id number] Yes Take 500 mg by mouth daily. [provider] Taking Active Self  TOUJEO SOLOSTAR 300 UNIT/ML Solostar Pen [id number] Yes INJECT 69 UNITS INTO THE SKIN EVERY DAY [name], MD Taking Active   traZODone (DESYREL) 50 MG tablet [id number] Yes TAKE 1/2 TO 1 TABLET(25 TO 50 MG) BY MOUTH AT BEDTIME AS NEEDED FOR SLEEP [name], MD Taking Active             Patient Active Problem List   Diagnosis Date Noted   COPD with asthma ([place]) 03/02/2022   Wernicke encephalopathy 10/14/2021   Psoriasis 10/14/2021   Hypokalemia 07/30/2021   Hypertension 06/05/2020   Pressure injury of skin  02/21/2020   Rapid atrial fibrillation ([place]) 02/20/2020   High anion gap metabolic acidosis 51/11/5850   Secondary hypercoagulable state ([place]) 11/02/2019   Paroxysmal atrial fibrillation ([place]) 10/24/2019   Degeneration of lumbar intervertebral disc 09/27/2019   Thoracic aortic aneurysm ([place]) 02/08/2019   Hemoptysis 10/06/2018   S/P right THA, 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   S/P hip replacement 09/12/2018   Low back pain 05/19/2018   Sleep apnea 01/09/2018   Persistent atrial fibrillation 12/12/2017   Allergic rhinitis 08/22/2015   Family history  of colon cancer in mother deceased age 42 07-23-2015   Type 2 diabetes mellitus, controlled ([place]) 07/17/2013   COPD with acute exacerbation ([place]) 07/05/2013   CAD (coronary artery disease) 06/26/2013   Hypertension associated with diabetes ([place]) 06/26/2013   Hyperlipidemia associated with type 2 diabetes mellitus ([place]) 06/26/2013   GERD (gastroesophageal reflux disease) 06/26/2013   History of atrial fibrillation without current medication 06/26/2013   Gout 06/26/2013   Osteoarthritis 06/26/2013   Asthma, mild persistent 06/26/2013   Alcohol abuse 06/26/2013   PTSD (post-traumatic stress disorder) 06/26/2013   HLA B27 (HLA B27 positive) 06/26/2013   Obesity (BMI 30-39.9) 06/26/2013   Benign essential hypertension 06/26/2013    Immunization History  Administered Date(s) Administered   Fluad Quad(high Dose 65+) 06/25/2019, 07/18/2020, 07/10/2021   Influenza Split 07/04/2012   Influenza, High Dose Seasonal PF 05/28/2016, 06/08/2017, 06/23/2018   Influenza,inj,Quad PF,6+ Mos 07/05/2013, 05/22/2014, 06/17/2015   PFIZER(Purple Top)SARS-COV-2 Vaccination 12/08/2019, 01/08/2020, 09/28/2020   Pneumococcal Conjugate-13 12/01/2015   Pneumococcal Polysaccharide-23 10/05/2006, 11/30/2016   Td 10/06/2009   Zoster Recombinat (Shingrix) 12/02/2016, 04/02/2017   Zoster, Live 08/21/2013   Patient brought Dexcom G7 CGM supplies and presents today for education on  use and initial placement.  I have reviewed proper use, including but not limited to: glucose direction and how to incorporate it in treatment decisions including meal insulin and exercise, evaluating previous evening trends first thing in the morning, and downloading and attaching to email.  Sensor lag was explained. Oriented to the reader device and reminded not to discard Transmitter when the sensors are replaced every 10 days. Set lower BG alarm to 70 and upper BG alarm to 250.  No calibration and no finger sticks needed. 2 hour warm up and staying within 20 feet of transmitter reviewed. Advised not to administer insulin within 3 inches of sensor.  Procedure and information discussed with patient. Patient has no further questions at this time. Verbalizes understanding and agrees to have CGM placed.  left arm cleaned and prepared as instructed by manufacturers instructions. Dexcom G7 CGM placed as instructed by manufacturers instructions. No redness, swelling, bleeding, or bruising noted. Sensor activated and monitoring at this time. Patient is aware to resume normal activities including bathing, getting dressed, and exercise.    Conditions to be addressed/monitored:  Hypertension, Hyperlipidemia, Diabetes, Atrial Fibrillation, Coronary Artery Disease, COPD, and Allergic Rhinitis  Conditions addressed this visit: Diabetes, COPD  Care Plan : CCM Pharmacy Care Plan  Updates made by Viona Gilmore, [place] since 04/28/2022 12:00 AM     Problem: Problem: Hypertension, Hyperlipidemia, Diabetes, Atrial Fibrillation, Coronary Artery Disease, COPD, and Allergic Rhinitis      Long-Range Goal: Patient-Specific Goal   Start Date: 12/07/2021  Expected End Date: 12/08/2022  Recent Progress: Not on track  Priority: High  Note:   Current Barriers:  Unable to independently monitor therapeutic efficacy Unable to self administer medications as prescribed  Pharmacist Clinical Goal(s):  Patient will achieve  adherence to monitoring guidelines and medication adherence to achieve therapeutic efficacy through collaboration with PharmD and provider.   Interventions: 1:1 collaboration with [name], MD regarding development and update of comprehensive plan of care as evidenced by provider attestation and co-signature Inter-disciplinary care team collaboration (see longitudinal plan of care) Comprehensive medication review performed; medication list updated in electronic medical record  Hypertension (BP goal <140/90) -Controlled -Current treatment: Metoprolol tartrate 50 mg 1 tablet twice daily - Appropriate, Query effective, Safe, Accessible -Medications previously tried: unknown  -Current  home readings: not checking often - haven't done it since last visit to hospital (arm cuff)  -Current dietary habits: doesn't cook with salt or season with it; sometimes eating out once a week  -Current exercise habits: no exercise -Reports hypotensive/hypertensive symptoms -Educated on BP goals and benefits of medications for prevention of heart attack, stroke and kidney damage; Importance of home blood pressure monitoring; Proper BP monitoring technique; Symptoms of hypotension and importance of maintaining adequate hydration; -Counseled to monitor BP at home weekly, document, and provide log at future appointments -Counseled on diet and exercise extensively Recommended to continue current medication  Hyperlipidemia: (LDL goal < 55) -Uncontrolled -Current treatment: Rosuvastatin 40 mg 1 tablet daily - Appropriate, Query effective, Safe, Accessible -Medications previously tried: none  -Current dietary patterns: doesn't eat out much; drinking several drinks every day -Current exercise habits: none -Educated on Cholesterol goals;  Benefits of statin for ASCVD risk reduction; Importance of limiting foods high in cholesterol; -Counseled on diet and exercise extensively Recommended repeat lipid  panel and consider addition of Zetia if LDL is not < 55.  Diabetes (A1c goal <7%) -Controlled -Current medications: Synjardy XR 25-1000 mg 1 tablet daily - Appropriate, Effective, Safe, Accessible Toujeo 300 units/ml inject 60 units daily - Query Appropriate, Effective, Query Safe, Accessible -Medications previously tried: Januvia (low A1c)  -Current home glucose readings fasting glucose: does not check post prandial glucose: does not check -Denies hypoglycemic/hyperglycemic symptoms -Current meal patterns: sometime skips lunch or light lunch breakfast: cereal, eggs and bacon, raspberries sometimes lunch: cold cuts sandwich  dinner: wife cooks - usually meat and vegetable snacks: cookies, chips, etc. drinks: wine in AM, scotch at night; water, gatorade zero; coffee (caff) sometimes -Current exercise: no exercise -Educated on A1c and blood sugar goals; Complications of diabetes including kidney damage, retinal damage, and cardiovascular disease; Benefits of routine self-monitoring of blood sugar; Continuous glucose monitoring; Carbohydrate counting and/or plate method -Counseled to check feet daily and get yearly eye exams -Educated on Dexcom G7 monitoring.  Atrial Fibrillation (Goal: prevent stroke and major bleeding) -Controlled -CHADSVASC: 5 -Current treatment: Rate/rhythm control: metoprolol tartrate 50 mg 1 tablet twice daily, amiodarone 200 mg 1 tablet daily - Appropriate, Effective, Safe, Accessible Anticoagulation: Eliquis 5 mg 1 tablet twice daily - Appropriate, Effective, Safe, Accessible -Medications previously tried: none -Home BP and HR readings: refer to above - not checking regularly  -Counseled on importance of adherence to anticoagulant exactly as prescribed; bleeding risk associated with Eliquis and importance of self-monitoring for signs/symptoms of bleeding; avoidance of NSAIDs due to increased bleeding risk with anticoagulants; -Recommended to continue  current medication Recommended avoiding Excedrin with aspirin component.  COPD (Goal: control symptoms and prevent exacerbations) -Uncontrolled -Current treatment  Pulmicort 0.5 mg inhale contents of vial via nebulizer twice daily - haven't started Brovana 15 mcg inhale contents of vial via nebulizer twice daily - haven't started Yupelri 175 mcg inhale contents of vial via nebulizer once daily - haven't started -Medications previously tried: Trelegy , Librarian, academic (unable to inhale) -Gold Grade: Gold 2 (FEV1 50-79%) -Current COPD Classification:  B (high sx, <2 exacerbations/yr) -MMRC/CAT score: n/a -Pulmonary function testing: 2013 -Exacerbations requiring treatment in last 6 months: none -Patient denies consistent use of maintenance inhaler -Frequency of rescue inhaler use: not using yet -Counseled on Proper inhaler technique; Benefits of consistent maintenance inhaler use When to use rescue inhaler -Recommended to continue current medication Recommended to start using nebulizers.  Depression/Anxiety (Goal: minimize symptoms) -Controlled -Current treatment: Duloxetine 60 mg 1  capsule daily - Appropriate, Effective, Safe, Accessible -Medications previously tried/failed: n/a -PHQ9: 0 -GAD7: n/a -Educated on Benefits of medication for symptom control Benefits of cognitive-behavioral therapy with or without medication -Recommended to continue current medication  Allergic rhinitis (Goal: minimize symptoms) -Controlled -Current treatment  Montelukast 10 mg 1 tablet at bedtime as needed (uses seasonally) - Appropriate, Effective, Safe, Accessible -Medications previously tried: Flonase (ineffective) -Counseled on avoidance of Advil cold and sinus for Advil component and increased risk of bleeding. Recommended saline nasal spray for congestion.  Restless legs syndrome (Goal: minimize symptoms) -Controlled -Current treatment  Gabapentin 300 mg 2 capsules as needed (1-2 capsules) -  Appropriate, Effective, Safe, Accessible -Medications previously tried: none  -Recommended to continue current medication  Gout (Goal: prevent flare ups and uric acid < 6) -Controlled -Current treatment  Allopurinol 300 mg 1 tablet daily - Appropriate, Query effective, Safe, Accessible -Medications previously tried: none  -Recommended repeat uric acid level.  Health Maintenance -Vaccine gaps: tetanus, COVID booster -Current therapy:  Divalproex 125 mg 1 capsule three times daily Magnesium oxide 400 mg 1 capsule daily - not taking -   Thiamine 100 mg 500 mg daily - not taking  Vitamin B complex-Folic acid daily  Thiamine 100 mg daily -Educated on Cost vs benefit of each product must be carefully weighed by individual consumer -Patient is satisfied with current therapy and denies issues -Recommended discussing with prescriber about use of divalproex as patient was unsure.  Patient Goals/Self-Care Activities Patient will:  - take medications as prescribed as evidenced by patient report and record review check glucose daily, document, and provide at future appointments check blood pressure weekly, document, and provide at future appointments  Follow Up Plan: Face to Face appointment with care management team member scheduled for: 30 days       Medication Assistance: None required.  Patient affirms current coverage meets needs.  Compliance/Adherence/Medication fill history: Care Gaps: Eye exam, foot exam, COVID booster, tetanus BP- 140/70 04/21/22 A1c - 5.6% (04/21/22)  Star-Rating Drugs: Empagliflozin-metformin (Synjardy) 25 mg - Last filled 12/24/21 90 DS at [facility] Rosuvastatin 40 mg - Last filled 01/05/22 90 DS at [facility]  Patient's preferred pharmacy is:  [place], [place] - [address] [place] [address] Phone: [phone] Fax: [phone]  [place], [place] - [address] Melina Modena [address] Janann Colonel [place] [address] Phone: [phone] Fax: [phone]  Uses pill box? No - uses a tub Pt endorses 50% compliance - he won't sort them  We discussed: Current pharmacy is preferred with insurance plan and patient is satisfied with pharmacy services Patient decided to: Continue current medication management strategy  Care Plan and Follow Up Patient Decision:  Patient agrees to Care Plan and Follow-up.  Plan: Face to Face appointment with care management team member scheduled for: 1 month  [name], PharmD, [place] Pharmacist [place] at [place] [phone]

## 2022-04-28 NOTE — Patient Instructions (Addendum)
Look for your nebulizer medications:  Brovana 2 mL Twice daily   Budesonide 2 mL Twice daily. Brush tongue and rinse mouth afterwards - this is the one that can cause thrush  Yupelri 3 mL once daily   Maddie [name], PharmD, [place] Pharmacist [place] at [place]

## 2022-04-30 ENCOUNTER — Other Ambulatory Visit: Payer: Self-pay | Admitting: Family Medicine

## 2022-05-03 DIAGNOSIS — E1142 Type 2 diabetes mellitus with diabetic polyneuropathy: Secondary | ICD-10-CM

## 2022-05-03 DIAGNOSIS — J449 Chronic obstructive pulmonary disease, unspecified: Secondary | ICD-10-CM

## 2022-05-14 DIAGNOSIS — E119 Type 2 diabetes mellitus without complications: Secondary | ICD-10-CM | POA: Diagnosis not present

## 2022-05-19 ENCOUNTER — Telehealth: Payer: Self-pay | Admitting: Emergency Medicine

## 2022-05-19 ENCOUNTER — Telehealth: Payer: Self-pay | Admitting: Pharmacist

## 2022-05-19 NOTE — Telephone Encounter (Signed)
Samara Deist from [facility] called and states that patient never received Pulmicort, Brovana or Yupelri from February. Some medications show as out of network with patient's insurance. Patient is coming in for a follow up on 8/31 but would like medication prior.   Please advise- call patient's wife back at [phone].

## 2022-05-19 NOTE — Telephone Encounter (Signed)
Pharmacy can you please advise as nebulizer was sent to DME pharmacy who should have filed it under Medicare part B

## 2022-05-19 NOTE — Progress Notes (Addendum)
Call to patient's wife per MP to follow up on My Chart message concerning nebulizer medications. Wife reports that the patient only has Albuterol for nebulization. She Confirms the pharmacy they use is [facility] [address] in [place]. Pharmacist advised to call [place] [phone] to see the status, per Per company patient was cancelled in March as they are out of network  for insurance coverage with his [place]. [place] Clinical Pharmacist Assistant [phone]

## 2022-05-19 NOTE — Chronic Care Management (AMB) (Signed)
Call to [facility] to make aware that pt never received prescribed medications because of coverage, was advised the message will be forwarded to provider for attention to see where they need to send, they will reach out to Patient's wife with further instructions.   [place] Clinical Pharmacist Assistant [phone]

## 2022-05-21 DIAGNOSIS — E119 Type 2 diabetes mellitus without complications: Secondary | ICD-10-CM | POA: Diagnosis not present

## 2022-05-23 ENCOUNTER — Other Ambulatory Visit: Payer: Self-pay | Admitting: Family Medicine

## 2022-05-24 ENCOUNTER — Telehealth: Payer: Self-pay | Admitting: Pharmacist

## 2022-05-24 ENCOUNTER — Other Ambulatory Visit (HOSPITAL_COMMUNITY): Payer: Self-pay

## 2022-05-24 ENCOUNTER — Ambulatory Visit (INDEPENDENT_AMBULATORY_CARE_PROVIDER_SITE_OTHER): Payer: Medicare Other

## 2022-05-24 DIAGNOSIS — I480 Paroxysmal atrial fibrillation: Secondary | ICD-10-CM | POA: Diagnosis not present

## 2022-05-24 NOTE — Progress Notes (Signed)
[facility] Summary Report / Loop Recorder 

## 2022-05-24 NOTE — Chronic Care Management (AMB) (Signed)
Chronic Care Management Pharmacy Assistant   Name: Adam [name]  MRN: [id number] DOB: 1951/05/31   05/24/22 APPOINTMENT REMINDER  Patient's Wife was reminded to have all medications, supplements and any blood glucose and blood pressure readings available for review with [name], Pharm. D, for office visit on 8/23 at 11:30.    Care Gaps: Foot Exam - Overdue COVID Booster - Overdue Eye Exam - Overdue  Flu Vaccne - Overdue TDAP - Postponed BP- 140/70 04/21/22 AWV- 3/23 Lab Results  Component Value Date   HGBA1C 5.6 04/21/2022    Star Rating Drug: Empagliflozin-metformin (Synjardy) 25 mg - Last filled 04/30/22 [facility] at [facility] Rosuvastatin 40 mg - Last filled 01/05/22 90 DS at [facility]  Verified   Medications: Outpatient Encounter Medications as of 05/24/2022  Medication Sig   albuterol (PROVENTIL) (2.5 MG/3ML) 0.083% nebulizer solution Take 3 mLs (2.5 mg total) by nebulization every 6 (six) hours as needed for wheezing or shortness of breath.   allopurinol (ZYLOPRIM) 300 MG tablet TAKE 1 TABLET(300 MG) BY MOUTH DAILY   amiodarone (PACERONE) 200 MG tablet TAKE 1 TABLET(200 MG) BY MOUTH DAILY   arformoterol (BROVANA) 15 MCG/2ML NEBU Take 2 mLs (15 mcg total) by nebulization 2 (two) times daily.   B COMPLEX-C-FOLIC ACID PO Take 1 tablet by mouth daily.   Blood Glucose Monitoring Suppl (ONETOUCH VERIO) w/Device KIT Use 1-4 times daily as needed/directed DX E11.9   Budeson-Glycopyrrol-Formoterol (BREZTRI AEROSPHERE) 160-9-4.8 MCG/ACT AERO Inhale 2 puffs into the lungs in the morning and at bedtime.   divalproex (DEPAKOTE SPRINKLE) 125 MG capsule Take 125 mg by mouth 3 (three) times daily.   DULoxetine (CYMBALTA) 60 MG capsule TAKE 1 CAPSULE(60 MG) BY MOUTH DAILY   ELIQUIS 5 MG TABS tablet TAKE 1 TABLET BY MOUTH TWICE DAILY   Empagliflozin-metFORMIN HCl ER (SYNJARDY XR) 25-1000 MG TB24 Take 1 tablet by mouth daily.   gabapentin (NEURONTIN) 300 MG capsule TAKE 2  CAPSULES BY MOUTH AT NIGHT AS NEEDED FOR RESTLESS LEGS OR SYMPTOMS (Patient taking differently: Take 600 mg by mouth at bedtime as needed (restless legs or symptoms).)   HM LIDOCAINE PATCH EX Apply 1 patch topically as needed (pain).   Insulin Pen Needle (NOVOFINE PEN NEEDLE) 32G X 6 MM MISC Use 1-4 times daily as needed for insulin   Lancets (ONETOUCH ULTRASOFT) lancets Use 1-4 times daily as needed/directed  DX E11.9   Magnesium Oxide 400 MG CAPS Take 1 capsule (400 mg total) by mouth daily.   metoprolol tartrate (LOPRESSOR) 50 MG tablet TAKE 1 TABLET(50 MG) BY MOUTH TWICE DAILY   montelukast (SINGULAIR) 10 MG tablet TAKE 1 TABLET BY MOUTH AT BEDTIME   ONETOUCH VERIO test strip Use 1-4 times daily as directed/needed   DX E11.9   revefenacin (YUPELRI) 175 MCG/3ML nebulizer solution Take 3 mLs (175 mcg total) by nebulization daily.   rosuvastatin (CRESTOR) 40 MG tablet TAKE 1 TABLET(40 MG) BY MOUTH DAILY   SKYRIZI PEN 150 MG/ML SOAJ Inject into the skin. Takes every 3 months   sodium chloride (OCEAN) 0.65 % SOLN nasal spray Place 1 spray into both nostrils as needed for congestion.   Spacer/Aero-Holding Dorise Bullion Use with inhaler   thiamine (VITAMIN B-1) 100 MG tablet Take 500 mg by mouth daily.   TOUJEO SOLOSTAR 300 UNIT/ML Solostar Pen INJECT 60 UNITS INTO THE SKIN EVERY DAY   traZODone (DESYREL) 50 MG tablet TAKE 1/2 TO 1 TABLET(25 TO 50 MG) BY MOUTH AT BEDTIME AS  NEEDED FOR SLEEP   No facility-administered encounter medications on file as of 05/24/2022.      [place] Clinical Pharmacist Assistant [phone]

## 2022-05-25 ENCOUNTER — Other Ambulatory Visit (HOSPITAL_COMMUNITY): Payer: Self-pay

## 2022-05-25 ENCOUNTER — Ambulatory Visit (INDEPENDENT_AMBULATORY_CARE_PROVIDER_SITE_OTHER): Payer: Medicare Other | Admitting: Pharmacist

## 2022-05-25 DIAGNOSIS — J449 Chronic obstructive pulmonary disease, unspecified: Secondary | ICD-10-CM

## 2022-05-25 DIAGNOSIS — E1142 Type 2 diabetes mellitus with diabetic polyneuropathy: Secondary | ICD-10-CM

## 2022-05-25 LAB — CUP PACEART REMOTE DEVICE CHECK
Date Time Interrogation Session: 20230820231100
Implantable Pulse Generator Implant Date: 20200928

## 2022-05-25 NOTE — Progress Notes (Signed)
Chronic Care Management Pharmacy Note  05/25/2022 Name:  Adam [name] MRN:  [id number] DOB:  [birth year]  Summary: A1c at goal < 7% but pt is overbasalized  Pt brought nebulizer supplies to visit and is still missing his 2 maintenance ones Pt reports sleep has not improved much  Recommendations/Changes made from today's visit: -Demonstrated proper use of nebulized medications -Recommended decreasing dose of Toujeo to 52 units to avoid overbasalization -Assisted with transition to [facility] for adherence packaging based on insurance preferences  -Recommended increasing trazodone to up to 100 mg per night  Plan: Sleep assessment in 1 month Follow up with PCP visit for Dexcom report  Subjective: Adam [name] is an 71 y.o. year old male who is a primary patient of Adam [name], Adam Sierras, MD.  The CCM team was consulted for assistance with disease management and care coordination [name].    Engaged with patient face to face for follow up visit in response to provider referral for pharmacy case management and/or care coordination services.   Consent to Services:  The patient was given information about Chronic Care Management services, agreed to services, and gave verbal consent prior to initiation of services.  Please see initial visit note for detailed documentation.   Patient Care Team: Adam Post, MD as PCP - General (Family Medicine) Adam Grayer, MD as PCP - Electrophysiology (Cardiology) Adam Margarita, MD as PCP - Sleep Medicine (Sleep Medicine) Adam Reedy, MD as PCP - Cardiology (Cardiology) Adam Polite, MD as Referring Physician (Internal Medicine) Adam [name], [facility] as Pharmacist (Pharmacist)  Recent office visits: 04/21/22 Adam Post, MD: Patient presented for Controlled type 2 diabetes with polyneuropathy. D/c'd Januvia. Follow up in 3 months.  01/20/22 Adam [name], Adam Sierras, MD: Patient presented for Controlled type 2 diabetes with  polyneuropathy. Decreased Toujeo to 60 units daily. Follow up in 3 months.  12/30/21 Rolene Arbour, LPN: Patient presented for AWV.  Recent consult visits: 03/02/22 Adam Kitchen, NP (Pulmonology): Patient presented for COPD with asthma follow up. Patient has stopping using the [facility] since last visit and unable to use the nebulizers as he feels these are too complicated. Recommended restarting Brovana, Budesonide and Yulpelri.  01/28/22 Adam Kitchen, NP (Pulmonology): Patient presented for COPD with asthma follow up. Patient did not use Breztri as he was unable to. Recommended trial of Beztri with spacer.  01/27/22 Adam Palau, NP (cardiology): Patient presented for Afib follow up.  Follow up in 6 months.  01/13/22 Adam Kitchen, NP (Pulmonology): Patient presented for cough and COPD follow up.  Trial Breztri BID with spacer. Prescribed prednisone taper.  12/01/21 Adam Gobble, MD (Pulmonology) - Patient presented for COPD with acute exacerbation. Prescribed Arformoterol Tartrate, Budesonide &  Revefenacin   09/24/21 Patient presented to [facility] for Modified Barium Swallow.   08/10/21 Adam Ran, MD (Neurology) - Patient presented for Alcohol abuse and other concerns. No medication changes.   07/30/21 Adam Needs, NP (Cardiology) - Patient presented for Paroxysmal Atrial fibrillation and other concerns. No medication changes.   06/11/21 Patient presented for Colonoscopy.  Hospital visits: Medication Reconciliation was completed by comparing discharge summary, patient's EMR and Pharmacy list, and upon discussion with patient.   Patient presented to [facility] on 07/30/21 due to Hypokalemia. Patient was present for 3 days.   New?Medications Started at [facility] Discharge:?? -started  none   Medication Changes at Hospital Discharge: -Changed  none   Medications Discontinued at Hospital Discharge: -  Stopped  none   Medications that  remain the same after Hospital Discharge:??  -All other medications will remain the same.    Objective:  Lab Results  Component Value Date   CREATININE 1.01 01/27/2022   BUN 12 01/27/2022   GFR 70.80 08/05/2021   EGFR 96 12/08/2020   GFRNONAA >60 01/27/2022   GFRAA 115 08/22/2020   NA 141 01/27/2022   K 4.0 01/27/2022   CALCIUM 9.0 01/27/2022   CO2 26 01/27/2022   GLUCOSE 109 (H) 01/27/2022    Lab Results  Component Value Date/Time   HGBA1C 5.6 04/21/2022 11:06 AM   HGBA1C 5.8 (A) 01/20/2022 11:40 AM   HGBA1C 5.7 (H) 03/20/2021 04:50 PM   HGBA1C 6.4 12/22/2020 11:16 AM   HGBA1C 10.1 (H) 09/23/2020 12:16 PM   GFR 70.80 08/05/2021 03:57 PM   GFR 75.74 12/03/2019 03:02 PM   MICROALBUR 2.6 (H) 06/22/2016 11:05 AM   MICROALBUR 0.7 06/12/2015 10:08 AM    Last diabetic Eye exam:  Lab Results  Component Value Date/Time   HMDIABEYEEXA No Retinopathy 09/01/2020 12:00 AM    Last diabetic Foot exam: No results found for: "HMDIABFOOTEX"   Lab Results  Component Value Date   CHOL 137 09/23/2020   HDL 40 09/23/2020   LDLCALC 64 09/23/2020   LDLDIRECT 124.0 06/23/2018   TRIG 280 (H) 09/23/2020   CHOLHDL 3.4 09/23/2020       Latest Ref Rng & Units 01/27/2022   12:16 PM 07/30/2021    4:42 PM 07/30/2021    1:39 PM  Hepatic Function  Total Protein 6.5 - 8.1 g/dL 6.7  6.9  6.9   Albumin 3.5 - 5.0 g/dL 3.5  3.9  3.4   AST 15 - 41 U/L 39  28  39   ALT 0 - 44 U/L 32  16  22   Alk Phosphatase 38 - 126 U/L 48  48  49   Total Bilirubin 0.3 - 1.2 mg/dL 1.0  0.9  1.1     Lab Results  Component Value Date/Time   TSH 1.652 01/27/2022 12:17 PM   TSH 1.984 07/30/2021 01:40 PM   TSH 1.32 12/29/2018 11:01 AM   TSH 2.87 06/15/2017 10:05 AM       Latest Ref Rng & Units 08/05/2021    3:57 PM 08/01/2021    9:09 AM 07/30/2021    4:42 PM  CBC  WBC 4.0 - 10.5 K/uL 6.8  5.7  6.5   Hemoglobin 13.0 - 17.0 g/dL 13.7  11.9  13.0   Hematocrit 39.0 - 52.0 % 40.6  34.4  37.3   Platelets  150.0 - 400.0 K/uL 283.0  187  218     No results found for: "VD25OH"  Clinical ASCVD: Yes  The 10-year ASCVD risk score (Arnett DK, et al., 2019) is: 40.7%   Values used to calculate the score:     Age: 64 years     Sex: Male     Is Non-Hispanic African American: No     Diabetic: Yes     Tobacco smoker: No     Systolic Blood Pressure: 737 mmHg     Is BP treated: Yes     HDL Cholesterol: 40 mg/dL     Total Cholesterol: 137 mg/dL       12/30/2021    4:00 PM 12/24/2020   11:40 AM 11/20/2019   10:48 AM  Depression screen PHQ 2/9  Decreased Interest 0 0 2  Down, Depressed, Hopeless 0  0 2  PHQ - 2 Score 0 0 4  Altered sleeping   1  Tired, decreased energy   2  Change in appetite   0  Feeling bad or failure about yourself    0  Trouble concentrating   0  Moving slowly or fidgety/restless   0  Suicidal thoughts   0  PHQ-9 Score   7    CHA2DS2/VAS Stroke Risk Points  Current as of 24 minutes ago     5 >= 2 Points: High Risk  1 - 1.99 Points: Medium Risk  0 Points: Low Risk    No Change      Details    This score determines the patient's risk of having a stroke if the  patient has atrial fibrillation.       Points Metrics  1 Has Congestive Heart Failure:  Yes    Current as of 24 minutes ago  1 Has Vascular Disease:  Yes    Current as of 24 minutes ago  1 Has Hypertension:  Yes    Current as of 24 minutes ago  1 Age:  64    Current as of 24 minutes ago  1 Has Diabetes:  Yes    Current as of 24 minutes ago  0 Had Stroke:  No  Had TIA:  No  Had Thromboembolism:  No    Current as of 24 minutes ago  0 Male:  No    Current as of 24 minutes ago       Social History   Tobacco Use  Smoking Status Former   Packs/day: 1.00   Years: 7.00   Total pack years: 7.00   Types: Cigarettes   Quit date: 10/04/1977   Years since quitting: 44.6  Smokeless Tobacco Never  Tobacco Comments   never smoked over 1 pack    BP Readings from Last 3 Encounters:  04/21/22 140/70   03/02/22 118/70  01/28/22 118/72   Pulse Readings from Last 3 Encounters:  04/21/22 75  03/02/22 74  01/28/22 78   Wt Readings from Last 3 Encounters:  04/21/22 231 lb (104.8 kg)  03/02/22 239 lb (108.4 kg)  01/28/22 243 lb 12.8 oz (110.6 kg)   BMI Readings from Last 3 Encounters:  04/21/22 33.15 kg/m  03/02/22 34.29 kg/m  01/28/22 34.98 kg/m    Assessment/Interventions: Review of patient past medical history, allergies, medications, health status, including review of consultants reports, laboratory and other test data, was performed as part of comprehensive evaluation and provision of chronic care management services.   SDOH:  (Social Determinants of Health) assessments and interventions performed: Yes (last done 12/07/21)   SDOH Screenings   Alcohol Screen: High Risk (12/30/2021)   Alcohol Screen    Last Alcohol Screening Score (AUDIT): 19  Depression (PHQ2-9): Low Risk  (12/30/2021)   Depression (PHQ2-9)    PHQ-2 Score: 0  Financial Resource Strain: Low Risk  (12/30/2021)   Overall Financial Resource Strain (CARDIA)    Difficulty of Paying Living Expenses: Not hard at all  Food Insecurity: No Food Insecurity (12/30/2021)   Hunger Vital Sign    Worried About Running Out of Food in the Last Year: Never true    [name] Out of Food in the Last Year: Never true  Housing: Low Risk  (12/24/2020)   Housing    Last Housing Risk Score: 0  Physical Activity: Inactive (12/30/2021)   Exercise Vital Sign    Days of Exercise per Week: 0 days  Minutes of Exercise per Session: 0 min  Social Connections: Moderately Isolated (12/30/2021)   Social Connection and Isolation Panel [NHANES]    Frequency of Communication with Friends and Family: More than three times a week    Frequency of Social Gatherings with Friends and Family: More than three times a week    Attends Religious Services: Never    Marine scientist or Organizations: No    Attends Archivist Meetings: Never     Marital Status: Married  Stress: No Stress Concern Present (12/30/2021)   [employer] of Stress : Not at all  Tobacco Use: Medium Risk (04/21/2022)   Patient History    Smoking Tobacco Use: Former    Smokeless Tobacco Use: Never    Passive Exposure: Not on file  Transportation [name]: No Transportation [name] (12/30/2021)   PRAPARE - Hydrologist (Medical): No    Lack of Transportation (Non-Medical): No     CCM Care Plan  Allergies  Allergen Reactions   Amoxicillin Rash and Other (See Comments)    RASH IN GROIN AREA Has patient had a PCN reaction causing immediate rash, facial/tongue/throat swelling, SOB or lightheadedness with hypotension: no Has patient had a PCN reaction causing severe rash involving mucus membranes or skin necrosis: no Has patient had a PCN reaction that required hospitalization: no Has patient had a PCN reaction occurring within the last 10 years: yes If all of the above answers are "NO", then may proceed with Cephalosporin use.    Augmentin [Amoxicillin-Pot Clavulanate] Rash and Other (See Comments)    RASH IN GROIN AREA   Azithromycin Rash and Other (See Comments)    RASH IN GROIN AREA   Clindamycin/Lincomycin Rash   Keflex [Cephalexin] Rash   Xarelto [Rivaroxaban] Other (See Comments)    Patient stated he "ended up in the hospital with a rectal bleed"    Medications Reviewed Today     Reviewed by Adam Post, MD (Physician) on 04/21/22 at 1118  Med List Status: <None>   Medication Order Taking? Sig Documenting Provider Last Dose Status Informant  albuterol (PROVENTIL) (2.5 MG/3ML) 0.083% nebulizer solution [id number] Yes Take 3 mLs (2.5 mg total) by nebulization every 6 (six) hours as needed for wheezing or shortness of breath. Clayton Bibles, NP Taking Active   allopurinol (ZYLOPRIM) 300 MG tablet [id number] Yes TAKE 1 TABLET(300 MG) BY  MOUTH DAILY Adam [name], Adam Sierras, MD Taking Active   amiodarone (PACERONE) 200 MG tablet [id number] Yes TAKE 1 TABLET(200 MG) BY MOUTH DAILY Adam Needs, NP Taking Active   arformoterol [facility]) 15 MCG/2ML NEBU [id number] Yes Take 2 mLs (15 mcg total) by nebulization 2 (two) times daily. Adam Gobble, MD Taking Active   B COMPLEX-C-FOLIC ACID PO [id number] Yes Take 1 tablet by mouth daily. [provider] Taking Active Self  Blood Glucose Monitoring Suppl [facility] VERIO) w/Device KIT [id number] Yes Use 1-4 times daily as needed/directed DX E11.9 Adam Post, MD Taking Active Self  Budeson-Glycopyrrol-Formoterol (BREZTRI AEROSPHERE) 160-9-4.8 MCG/ACT AERO [id number] Yes Inhale 2 puffs into the lungs in the morning and at bedtime. Clayton Bibles, NP Taking Active   divalproex (DEPAKOTE SPRINKLE) 125 MG capsule [id number] Yes Take 125 mg by mouth 3 (three) times daily. [provider] Taking Active Self  DULoxetine (CYMBALTA) 60 MG capsule [id number] Yes TAKE 1 CAPSULE(60 MG) BY MOUTH DAILY Adam [name], Adam Sierras, MD Taking  Active   ELIQUIS 5 MG TABS tablet [id number] Yes TAKE 1 TABLET BY MOUTH TWICE DAILY Adam [name], Adam Sierras, MD Taking Active   Empagliflozin-metFORMIN HCl ER (SYNJARDY XR) 25-1000 MG TB24 [id number] Yes Take 1 tablet by mouth daily. Adam Post, MD Taking Active   gabapentin (NEURONTIN) 300 MG capsule [id number] Yes TAKE 2 CAPSULES BY MOUTH AT NIGHT AS NEEDED FOR RESTLESS LEGS OR SYMPTOMS  Patient taking differently: Take 600 mg by mouth at bedtime as needed (restless legs or symptoms).   Adam Post, MD Taking Active   HM LIDOCAINE [facility] [id number] Yes Apply 1 patch topically as needed (pain). [provider] Taking Active Self  Insulin Pen Needle (NOVOFINE PEN NEEDLE) 32G X 6 MM MISC [id number] Yes Use 1-4 times daily as needed for insulin Adam Post, MD Taking Active Self  Discontinued 04/21/22 1118   Lancets (ONETOUCH ULTRASOFT)  lancets [id number] Yes Use 1-4 times daily as needed/directed  DX E11.9 Adam Post, MD Taking Active Self  Magnesium Oxide 400 MG CAPS [id number] Yes Take 1 capsule (400 mg total) by mouth daily. Adam Post, MD Taking Active   metoprolol tartrate (LOPRESSOR) 50 MG tablet [id number] Yes TAKE 1 TABLET(50 MG) BY MOUTH TWICE DAILY Adam Needs, NP Taking Active   montelukast (SINGULAIR) 10 MG tablet [id number] Yes TAKE 1 TABLET BY MOUTH AT BEDTIME Adam Post, MD Taking Active   [facility] VERIO test strip [id number] Yes Use 1-4 times daily as directed/needed   DX E11.9 Adam Post, MD Taking Active Self  revefenacin (YUPELRI) 175 MCG/3ML nebulizer solution [id number] Yes Take 3 mLs (175 mcg total) by nebulization daily. Adam Gobble, MD Taking Active   rosuvastatin (CRESTOR) 40 MG tablet [id number] Yes TAKE 1 TABLET(40 MG) BY MOUTH DAILY Adam Post, MD Taking Active   SKYRIZI PEN 150 MG/ML SOAJ [id number] Yes Inject into the skin. Takes every 3 months [provider] Taking Active   sodium chloride (OCEAN) 0.65 % SOLN nasal spray [id number] Yes Place 1 spray into both nostrils as needed for congestion. [provider] Taking Active   Spacer/Aero-Holding Dorise Bullion [id number] Yes Use with inhaler Clayton Bibles, NP Taking Active   thiamine (VITAMIN B-1) 100 MG tablet [id number] Yes Take 500 mg by mouth daily. [provider] Taking Active Self  TOUJEO SOLOSTAR 300 UNIT/ML Solostar Pen [id number] Yes INJECT 80 UNITS INTO THE SKIN EVERY DAY Adam Post, MD Taking Active   traZODone (DESYREL) 50 MG tablet [id number] Yes TAKE 1/2 TO 1 TABLET(25 TO 50 MG) BY MOUTH AT BEDTIME AS NEEDED FOR SLEEP Adam Post, MD Taking Active             Patient Active Problem List   Diagnosis Date Noted   COPD with asthma ([place]) 03/02/2022   Wernicke encephalopathy 10/14/2021   Psoriasis 10/14/2021   Hypokalemia 07/30/2021   Hypertension  06/05/2020   Pressure injury of skin 02/21/2020   Rapid atrial fibrillation ([place]) 02/20/2020   High anion gap metabolic acidosis 30/94/0768   Secondary hypercoagulable state ([place]) 11/02/2019   Paroxysmal atrial fibrillation ([place]) 10/24/2019   Degeneration of lumbar intervertebral disc 09/27/2019   Thoracic aortic aneurysm ([place]) 02/08/2019   Hemoptysis 10/06/2018   S/P right THA, 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   S/P hip replacement 09/12/2018   Low back pain 05/19/2018   Sleep apnea 01/09/2018   Persistent atrial fibrillation 12/12/2017   Allergic rhinitis 08/22/2015   Family history of colon cancer in mother  deceased age 42 07/13/2015   Type 2 diabetes mellitus, controlled ([place]) 07/17/2013   COPD with acute exacerbation ([place]) 07/05/2013   CAD (coronary artery disease) 06/26/2013   Hypertension associated with diabetes ([place]) 06/26/2013   Hyperlipidemia associated with type 2 diabetes mellitus ([place]) 06/26/2013   GERD (gastroesophageal reflux disease) 06/26/2013   History of atrial fibrillation without current medication 06/26/2013   Gout 06/26/2013   Osteoarthritis 06/26/2013   Asthma, mild persistent 06/26/2013   Alcohol abuse 06/26/2013   PTSD ([name]-traumatic stress disorder) 06/26/2013   HLA B27 (HLA B27 positive) 06/26/2013   Obesity (BMI 30-39.9) 06/26/2013   Benign essential hypertension 06/26/2013    Immunization History  Administered Date(s) Administered   Fluad Quad(high Dose 65+) 06/25/2019, 07/18/2020, 07/10/2021   Influenza Split 07/04/2012   Influenza, High Dose Seasonal PF 05/28/2016, 06/08/2017, 06/23/2018   Influenza,inj,Quad PF,6+ Mos 07/05/2013, 05/22/2014, 06/17/2015   PFIZER(Purple Top)SARS-COV-2 Vaccination 12/08/2019, 01/08/2020, 09/28/2020   Pneumococcal Conjugate-13 12/01/2015   Pneumococcal Polysaccharide-23 10/05/2006, 11/30/2016   Td 10/06/2009   Zoster Recombinat (Shingrix) 12/02/2016, 04/02/2017   Zoster, Live 08/21/2013   Patient brought nebulizer supplies and  educated on how to use. Patient has a visit scheduled next week with pulmonary and will get further instruction.  Conditions to be addressed/monitored:  Hypertension, Hyperlipidemia, Diabetes, Atrial Fibrillation, Coronary Artery Disease, COPD, and Allergic Rhinitis  Conditions addressed this visit: Diabetes, COPD  Care Plan : CCM Pharmacy Care Plan  Updates made by Adam [name], [place] since 05/25/2022 12:00 AM     Problem: Problem: Hypertension, Hyperlipidemia, Diabetes, Atrial Fibrillation, Coronary Artery Disease, COPD, and Allergic Rhinitis      Long-Range Goal: Patient-Specific Goal   Start Date: 12/07/2021  Expected End Date: 12/08/2022  Recent Progress: Not on track  Priority: High  Note:   Current Barriers:  Unable to independently monitor therapeutic efficacy Unable to self administer medications as prescribed  Pharmacist Clinical Goal(s):  Patient will achieve adherence to monitoring guidelines and medication adherence to achieve therapeutic efficacy through collaboration with PharmD and provider.   Interventions: 1:1 collaboration with Adam Post, MD regarding development and update of comprehensive plan of care as evidenced by provider attestation and co-signature Inter-disciplinary care team collaboration (see longitudinal plan of care) Comprehensive medication review performed; medication list updated in electronic medical record  Hypertension (BP goal <140/90) -Controlled -Current treatment: Metoprolol tartrate 50 mg 1 tablet twice daily - Appropriate, Query effective, Safe, Accessible -Medications previously tried: unknown  -Current home readings: not checking often - haven't done it since last visit to hospital (arm cuff)  -Current dietary habits: doesn't cook with salt or season with it; sometimes eating out once a week  -Current exercise habits: no exercise -Reports hypotensive/hypertensive symptoms -Educated on BP goals and benefits of medications  for prevention of heart attack, stroke and kidney damage; Importance of home blood pressure monitoring; Proper BP monitoring technique; Symptoms of hypotension and importance of maintaining adequate hydration; -Counseled to monitor BP at home weekly, document, and provide log at future appointments -Counseled on diet and exercise extensively Recommended to continue current medication  Hyperlipidemia: (LDL goal < 55) -Uncontrolled -Current treatment: Rosuvastatin 40 mg 1 tablet daily - Appropriate, Query effective, Safe, Accessible -Medications previously tried: none  -Current dietary patterns: doesn't eat out much; drinking several drinks every day -Current exercise habits: none -Educated on Cholesterol goals;  Benefits of statin for ASCVD risk reduction; Importance of limiting foods high in cholesterol; -Counseled on diet and exercise extensively Recommended repeat lipid panel and  consider addition of Zetia if LDL is not < 55.  Diabetes (A1c goal <7%) -Controlled -Current medications: Synjardy XR 25-1000 mg 1 tablet daily - Appropriate, Effective, Safe, Accessible Toujeo 300 units/ml inject 60 units daily - Query Appropriate, Effective, Query Safe, Accessible -Medications previously tried: Januvia (low A1c)  -Current home glucose readings fasting glucose: does not check [name] prandial glucose: does not check -Denies hypoglycemic/hyperglycemic symptoms -Current meal patterns: sometime skips lunch or light lunch breakfast: cereal, eggs and bacon, raspberries sometimes lunch: cold cuts sandwich  dinner: wife cooks - usually meat and vegetable snacks: cookies, chips, etc. drinks: wine in AM, scotch at night; water, gatorade zero; coffee (caff) sometimes -Current exercise: no exercise -Educated on A1c and blood sugar goals; Complications of diabetes including kidney damage, retinal damage, and cardiovascular disease; Benefits of routine self-monitoring of blood sugar; Continuous  glucose monitoring; Carbohydrate counting and/or plate method -Counseled to check feet daily and get yearly eye exams -Recommended decreasing dose of Toujeo to avoid overbasalization.  Atrial Fibrillation (Goal: prevent stroke and major bleeding) -Controlled -CHADSVASC: 5 -Current treatment: Rate/rhythm control: metoprolol tartrate 50 mg 1 tablet twice daily, amiodarone 200 mg 1 tablet daily - Appropriate, Effective, Safe, Accessible Anticoagulation: Eliquis 5 mg 1 tablet twice daily - Appropriate, Effective, Safe, Accessible -Medications previously tried: none -Home BP and HR readings: refer to above - not checking regularly  -Counseled on importance of adherence to anticoagulant exactly as prescribed; bleeding risk associated with Eliquis and importance of self-monitoring for signs/symptoms of bleeding; avoidance of NSAIDs due to increased bleeding risk with anticoagulants; -Recommended to continue current medication Recommended avoiding Excedrin with aspirin component.  COPD (Goal: control symptoms and prevent exacerbations) -Uncontrolled -Current treatment  Pulmicort 0.5 mg inhale contents of vial via nebulizer twice daily - haven't started Brovana 15 mcg inhale contents of vial via nebulizer twice daily - haven't started Yupelri 175 mcg inhale contents of vial via nebulizer once daily - haven't started -Medications previously tried: Trelegy , Librarian, academic (unable to inhale) -Gold Grade: Gold 2 (FEV1 50-79%) -Current COPD Classification:  B (high sx, <2 exacerbations/yr) -MMRC/CAT score: n/a -Pulmonary function testing: 2013 -Exacerbations requiring treatment in last 6 months: none -Patient denies consistent use of maintenance inhaler -Frequency of rescue inhaler use: not using yet -Counseled on Proper inhaler technique; Benefits of consistent maintenance inhaler use When to use rescue inhaler -Recommended to continue current medication Recommended to start using  nebulizers.  Depression/Anxiety (Goal: minimize symptoms) -Controlled -Current treatment: Duloxetine 60 mg 1 capsule daily - Appropriate, Effective, Safe, Accessible -Medications previously tried/failed: n/a -PHQ9: 0 -GAD7: n/a -Educated on Benefits of medication for symptom control Benefits of cognitive-behavioral therapy with or without medication -Recommended to continue current medication  Allergic rhinitis (Goal: minimize symptoms) -Controlled -Current treatment  Montelukast 10 mg 1 tablet at bedtime as needed (uses seasonally) - Appropriate, Effective, Safe, Accessible -Medications previously tried: Flonase (ineffective) -Counseled on avoidance of Advil cold and sinus for Advil component and increased risk of bleeding. Recommended saline nasal spray for congestion.  Restless legs syndrome (Goal: minimize symptoms) -Controlled -Current treatment  Gabapentin 300 mg 2 capsules as needed (1-2 capsules) - Appropriate, Effective, Safe, Accessible -Medications previously tried: none  -Recommended to continue current medication  Gout (Goal: prevent flare ups and uric acid < 6) -Controlled -Current treatment  Allopurinol 300 mg 1 tablet daily - Appropriate, Query effective, Safe, Accessible -Medications previously tried: none  -Recommended repeat uric acid level.  Insomnia (Goal: improve quality and quantity of sleep) -Uncontrolled -Current treatment  Trazodone 1/2 to 1 full tablet at bedtime as needed -Appropriate, Query effective, Safe, Accessible -Medications previously tried: none  -Recommended increasing dose to up to 100 mg per night.   Health Maintenance -Vaccine gaps: tetanus, COVID booster -Current therapy:  Divalproex 125 mg 1 capsule three times daily Magnesium oxide 400 mg 1 capsule daily - not taking -   Thiamine 100 mg 500 mg daily - not taking  Vitamin B complex-Folic acid daily  Thiamine 100 mg daily -Educated on Cost vs benefit of each product must be  carefully weighed by individual consumer -Patient is satisfied with current therapy and denies issues -Recommended discussing with prescriber about use of divalproex as patient was unsure.  Patient Goals/Self-Care Activities Patient will:  - take medications as prescribed as evidenced by patient report and record review check glucose daily, document, and provide at future appointments check blood pressure weekly, document, and provide at future appointments  Follow Up Plan: The care management team will reach out to the patient again over the next 7 days.         Medication Assistance: None required.  Patient affirms current coverage meets [name].  Compliance/Adherence/Medication fill history: Care Gaps: Eye exam, foot exam, COVID booster, tetanus, influenza vaccine BP- 140/70 04/21/22 A1c - 5.6% (04/21/22)  Star-Rating Drugs: Empagliflozin-metformin (Synjardy) 25 mg - Last filled 04/30/22 90 DS at [facility] Rosuvastatin 40 mg - Last filled 01/05/22 90 DS at [facility]  Patient's preferred pharmacy is:  [facility], [place] - [address] SEC OF [place] [address] [place] [address] Phone: [phone] Fax: [phone]  [facility], [place] [address]. Melina Modena [address] Fredderick Severance [place] [address] Phone: [phone] Fax: [phone]  [place], [place] [address] Phone: [phone] Fax: [phone]  Uses pill box? No - uses a tub Pt endorses 50% compliance - he won't sort them  We discussed: Current pharmacy is preferred with insurance plan and patient is satisfied with pharmacy services Patient decided to: Continue current medication management strategy  Care Plan and Follow Up Patient Decision:  Patient agrees to Care Plan and Follow-up.  Plan: Face to Face appointment with care  management team member scheduled for: 1 month  [name], PharmD, [place] Pharmacist [place] at [place] [phone]

## 2022-05-25 NOTE — Patient Instructions (Signed)
[facility], [place], [place] [address] [phone]   [place], PharmD, [place] Pharmacist [place] at [place]

## 2022-05-26 ENCOUNTER — Telehealth: Payer: Self-pay | Admitting: Pharmacist

## 2022-05-26 ENCOUNTER — Other Ambulatory Visit (HOSPITAL_COMMUNITY): Payer: Self-pay | Admitting: *Deleted

## 2022-05-26 ENCOUNTER — Other Ambulatory Visit: Payer: Self-pay

## 2022-05-26 DIAGNOSIS — E1169 Type 2 diabetes mellitus with other specified complication: Secondary | ICD-10-CM

## 2022-05-26 DIAGNOSIS — E1142 Type 2 diabetes mellitus with diabetic polyneuropathy: Secondary | ICD-10-CM

## 2022-05-26 MED ORDER — ALLOPURINOL 300 MG PO TABS
ORAL_TABLET | ORAL | 0 refills | Status: DC
Start: 2022-05-26 — End: 2022-09-01

## 2022-05-26 MED ORDER — BUDESONIDE 0.5 MG/2ML IN SUSP: 0.5000 mg | Freq: Two times a day (BID) | RESPIRATORY_TRACT | 3 refills | Status: DC

## 2022-05-26 MED ORDER — YUPELRI 175 MCG/3ML IN SOLN: 175.0000 ug | Freq: Every day | RESPIRATORY_TRACT | 3 refills | Status: DC

## 2022-05-26 MED ORDER — SYNJARDY XR 25-1000 MG PO TB24: 1.0000 | ORAL_TABLET | Freq: Every day | ORAL | 0 refills | Status: DC

## 2022-05-26 MED ORDER — ROSUVASTATIN CALCIUM 40 MG PO TABS: ORAL_TABLET | ORAL | 0 refills | Status: DC

## 2022-05-26 MED ORDER — AMIODARONE HCL 200 MG PO TABS
ORAL_TABLET | ORAL | 1 refills | Status: DC
Start: 2022-05-26 — End: 2022-09-20

## 2022-05-26 MED ORDER — DULOXETINE HCL 60 MG PO CPEP: ORAL_CAPSULE | ORAL | 0 refills | Status: DC

## 2022-05-26 MED ORDER — METOPROLOL TARTRATE 50 MG PO TABS: ORAL_TABLET | ORAL | 5 refills | Status: DC

## 2022-05-26 MED ORDER — MONTELUKAST SODIUM 10 MG PO TABS: 10.0000 mg | ORAL_TABLET | Freq: Every day | ORAL | 0 refills | Status: DC

## 2022-05-26 NOTE — Telephone Encounter (Signed)
Called patient's wife back to follow up from CCM visit yesterday. Patient and wife are aware to decrease the Tresiba to 52 units to avoid overbasalization.  Patient will try 2 of the trazodone tablets at bedtime to see if this helps with sleep.  Patient is also aware to toss out the extra divalproex that he still had as his PCP confirmed that he is not to be taking.  Will update medication list to reflect these changes.

## 2022-05-26 NOTE — Telephone Encounter (Signed)
Cobb please clearify, should pt be using 3 neb meds (Pulmicort didn't see on med list, Brovana ordered 11/2021, and Yupelri) ?

## 2022-05-26 NOTE — Telephone Encounter (Signed)
Nebs were sent in February by Dr. Lamonte Sakai. I reviewed this OV and they were sent to his mail in pharmacy. At his last visit, he wasn't using any of his treatments. If he is wishing to start the triple therapy nebs, please send budesonide 0.5 mg 2 mL neb Twice daily (brush tongue and rinse mouth afterwards), Brovana 15 mcg 2 mL neb Twice daily, and Yupelri 3 mL neb once daily to a pharmacy that is in network. 90 day supply, 3 refills. Will likely need to be a mail in pharmacy unless local pharmacy has these in stock.

## 2022-05-26 NOTE — Telephone Encounter (Signed)
Spk to [place] (wife) asked which pharmacy she would like neb meds sent to. Refilled yupelri and budesonide  Nothing further

## 2022-06-03 ENCOUNTER — Encounter: Payer: Self-pay | Admitting: Emergency Medicine

## 2022-06-03 ENCOUNTER — Ambulatory Visit: Payer: Medicare Other | Admitting: Emergency Medicine

## 2022-06-03 DIAGNOSIS — I10 Essential (primary) hypertension: Secondary | ICD-10-CM

## 2022-06-03 DIAGNOSIS — E785 Hyperlipidemia, unspecified: Secondary | ICD-10-CM

## 2022-06-03 DIAGNOSIS — F32A Depression, unspecified: Secondary | ICD-10-CM

## 2022-06-03 DIAGNOSIS — I2510 Atherosclerotic heart disease of native coronary artery without angina pectoris: Secondary | ICD-10-CM | POA: Diagnosis not present

## 2022-06-03 DIAGNOSIS — J449 Chronic obstructive pulmonary disease, unspecified: Secondary | ICD-10-CM | POA: Diagnosis not present

## 2022-06-03 DIAGNOSIS — E1159 Type 2 diabetes mellitus with other circulatory complications: Secondary | ICD-10-CM

## 2022-06-03 DIAGNOSIS — Z794 Long term (current) use of insulin: Secondary | ICD-10-CM

## 2022-06-03 NOTE — Assessment & Plan Note (Signed)
Unclear severity of his COPD.  He does not have any pulmonary function testing.  He somewhat impaired neurologically, question by Warnicke's, question of normal pressure hydrocephalus.  He cannot do handheld bronchodilator therapy.  We had tried to get him on scheduled nebulized BD.  He has not started these.  In retrospect not clear that his dyspnea is severe enough to merit it.  We will hold off on initiating any scheduled nebulized BD for now.  We will ensure that he knows how to use nebulized albuterol to use as needed.  Depending on how things progress we may decide to try to get him back on scheduled nebulized BD therapy in the future.  If so then we will go with DuoNeb since it is the most cost effective.

## 2022-06-03 NOTE — Telephone Encounter (Signed)
Pt had OV today in which this was addressed. Nothing further needed at this time.

## 2022-06-03 NOTE — Progress Notes (Signed)
Subjective:    Patient ID: Adam Adam [name], male    DOB: 01-Jan-1951, 71 y.o.   MRN: [id number]  COPD His past medical history is significant for COPD.   ROV Adam/28/23 --71 year old gentleman whom we have followed for COPD/asthma as well as chronic cough in the setting of chronic rhinitis on and GERD. PMH: Obesity, hypertension with diastolic dysfunction, CAD/CABG, atrial fibrillation, EtOH abuse I saw him a year ago at which time we had plan to continue Trelegy because it seemed that he was benefiting.  Today he reports that he has had a lot of changes since I last saw him - has been dx w Wernike's, has been progressive. Possible normal pressure hydrocephalus.  He stopped Trelegy because he has trouble inhaling it. He has more cough w mucous. More SOB.   ROV 06/03/2022 --follow-up visit 71 year old Adam [name] with a history of CAD/CABG, diastolic dysfunction with hypertension, atrial fibrillation. Significant cognitive impairment and difficulty speaking, ? Due to wernike's  I follow him for COPD/asthma as well as chronic cough in the setting of chronic rhinitis and GERD.  He was seen for an acute flare in April, principally for cough, was treated with prednisone.  He had been on nebulized bronchodilator therapy due to inability to deeply inhale Trelegy.  He was changed to [place] on 4/27 but did not get any benefit so he stopped this.  He was placed back on Brovana/budesonide plus Yupelri, but he hasn't started any of these - due to cost and because they say that they do not know how to do them. He uses mucinex.  Today he reports that he does not feels SOB. His wife reports that he is sedentary.        No data to display          Review of Systems As per hpi      Objective:   Physical Exam Vitals:   06/03/22 1207  BP: 136/74  Pulse: 64  Temp: 97.9 F (36.6 C)  TempSrc: Oral  SpO2: 96%  Weight: 233 lb 6.4 oz (105.9 kg)  Height: '5\' 10"'$  (1.778 m)   Gen: Pleasant, obese, chronically  ill-appearing  ENT: No lesions,  mouth clear,  oropharynx clear, no nasal congestion  Neck: No JVD, minimal upper airway noise  Lungs: Shallow breaths but clear bilaterally  Cardiovascular: RRR, heart sounds normal, no murmur or gallops, no edema  Musculoskeletal: No deformities, no cyanosis or clubbing  Neuro: Severe dysarthria and some spontaneous upper airway noise.  He is communicating by writing notes.  No tremor  Skin: Warm, no lesions or rashes      Assessment & Plan:  COPD with asthma ([place]) Unclear severity of his COPD.  He does not have any pulmonary function testing.  He somewhat impaired neurologically, question by Warnicke's, question of normal pressure hydrocephalus.  He cannot do handheld bronchodilator therapy.  We had tried to get him on scheduled nebulized BD.  He has not started these.  In retrospect not clear that his dyspnea is severe enough to merit it.  We will hold off on initiating any scheduled nebulized BD for now.  We will ensure that he knows how to use nebulized albuterol to use as needed.  Depending on how things progress we may decide to try to get him back on scheduled nebulized BD therapy in the future.  If so then we will go with DuoNeb since it is the most cost effective.  Baltazar Apo, MD, PhD 06/03/2022, 12:22 PM  [facility] [phone] or if no answer [phone]

## 2022-06-03 NOTE — Patient Instructions (Signed)
We will hold off on starting any scheduled nebulizer medication right now.  We can reconsider this in the future depending on how your breathing is doing Please use albuterol nebulizer if you needed for shortness of breath, chest tightness, wheezing.  We will show you how to use this today. Okay to continue Mucinex daily as you have been taking it Follow with Dr Lamonte Sakai in 6 months or sooner if you have any problems

## 2022-06-08 NOTE — Progress Notes (Signed)
Cardiology Office Note   Date:  06/11/2022   ID:  Adam [name], DOB 11/15/1950, MRN [id number]  PCP:  [name], MD  Cardiologist:   Alyscia Carmon [place]ique, MD   Chief Complaint  Patient presents with   Follow-up   Coronary Artery Disease   Atrial Fibrillation      History of Present Illness: Adam [name] is a 71 y.o. male who presents for follow up CAD. He is a former patient of [name]. He is followed by Dr. Rayann Heman in the EP clinic for Afib.  last seen by me in Jan 2022. He has a past medical history which includes CABG in 2011 done in [place], HTN, HLD, DM, Atrial fibrillation, and OSA- previously followed by Dr Radford Pax. He underwent catheter ablation for atrial fibrillation by Dr. Rayann Heman 03/31/18. He was on Tikosyn and Xarelto. Event monitor in June 2020 showed no Afib. Because of intermittent palpitations Dr Rayann Heman placed an ILR in September.   He no longer follows with Dr Radford Pax since unable to tolerate CPAP.   He had an Adam in January of 2019 that showed normal LVEF and no significant valvular abnormalities. Stress test in 2013 showed no ischemia. He had an abnormal chest CT done 01/2018 that showed an ascending aortic aneurysm, measuring 43 x 42 mm. Repeat CT angio in May 2020 showed no change in size. Last CT in 2021 showed greatest diameter 4.2 cm.   The patient reports that prior to CABG he had 25 stents placed over a 10-12 year period.  These procedures and his bypass were done in [place], [place]. He notes he previously exercised regularly but over the past 2 years hasn't been able to do much. 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   He was admitted in May 2021 with acute encephalopathy felt to be due to alcohol withdrawal and Wernicke's encephalopathy.  He was in Afib during this hospitalization initially w/RVR though rate controlled by discharge. He was later resumed on Tikosyn. He was at [facility].   He was admitted in  June 2022 with Afib. Had DCCV. Switched from Tikosyn to amiodarone. When seen in Afib clinic in April was in NSR. Continues Etoh abuse. Progressive Wernicke's aphasia. Last pacemaker check May 25, 2022 showed no arrhythmia.   He is seen today with his wife. Denies any chest pain. Has some SOB being followed by pulmonary. No palpitations, edema or orthopnea.    Past Medical History:  Diagnosis Date   Alcohol withdrawal ([place]) 10/24/2019   Allergy    Anxiety    history of PTSD following CABG   Ascending aortic aneurysm ([place]) 01/31/2018   43 x 42 mm, pt unaware   Asthma    Cardiomegaly 10/17/2017   Colitis- colonoscopy 2014 07/13/2015   COPD (chronic obstructive pulmonary disease) ([place])    Coronary artery disease    x 6   Depression    Diabetes mellitus without complication ([place])    Family history of polyps in the colon    Finger dislocation    Left pinkie   GERD (gastroesophageal reflux disease)    Gout    H/O atrial fibrillation without current medication    following CABG with no documented episodes since then.   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    Past Surgical History:  Procedure Laterality Date   ANKLE FRACTURE SURGERY Right 1991   APPENDECTOMY     ATRIAL FIBRILLATION ABLATION N/A 03/31/2018   Procedure: ATRIAL FIBRILLATION ABLATION;  Surgeon: [name], MD;  Location: [facility];  Service: Cardiovascular;  Laterality: N/A;   CARDIAC ELECTROPHYSIOLOGY MAPPING AND ABLATION     CARDIOVERSION N/A 03/23/2021   Procedure: CARDIOVERSION;  Surgeon: Acie Fredrickson Wonda Cheng, MD;  Location: [facility];  Service: Cardiovascular;  Laterality: N/A;   COLONOSCOPY  06/11/2021    COLONOSCOPY W/ BIOPSIES  2017   x7   CORONARY ANGIOPLASTY WITH STENT PLACEMENT     CORONARY ARTERY BYPASS GRAFT     FINGER SURGERY  04/2018   Small finger left hand   implantable loop recorder placement  07/02/2019   [employer]onic [employer] [place] model LNQ11 (SN [id number]) implanted in office by Dr Rayann Heman   TEE WITHOUT CARDIOVERSION N/A 03/30/2018   Procedure: TRANSESOPHAGEAL ECHOCARDIOGRAM (TEE);  Surgeon: [name], MD;  Location: [facility];  Service: Cardiovascular;  Laterality: N/A;   TEE WITHOUT CARDIOVERSION N/A 03/23/2021   Procedure: TRANSESOPHAGEAL ECHOCARDIOGRAM (TEE);  Surgeon: Acie Fredrickson Wonda Cheng, MD;  [facility];  Service: Cardiovascular;  Laterality: N/A;   TOTAL HIP ARTHROPLASTY Left    TOTAL HIP ARTHROPLASTY Right 09/12/2018   Procedure: TOTAL HIP ARTHROPLASTY ANTERIOR APPROACH;  Surgeon: [name], MD;  Location: [facility];  Service: Orthopedics;  Laterality: Right;  70 mins     Current Outpatient Medications  Medication Sig Dispense Refill   albuterol (PROVENTIL) (2.5 MG/3ML) 0.083% nebulizer solution Take 3 mLs (2.5 mg total) by nebulization every 6 (six) hours as needed for wheezing or shortness of breath. 75 mL 12   allopurinol (ZYLOPRIM) 300 MG tablet TAKE 1 TABLET(300 MG) BY MOUTH DAILY 90 tablet 0   amiodarone (PACERONE) 200 MG tablet TAKE 1 TABLET(200 MG) BY MOUTH DAILY 90 tablet 1   B COMPLEX-C-FOLIC ACID PO Take 1 tablet by mouth daily.     budesonide (PULMICORT) 0.5 MG/2ML nebulizer solution Take 2 mLs (0.5 mg total) by nebulization 2 (two) times daily. 60 mL 3   Continuous Blood Gluc Sensor (DEXCOM G7 SENSOR) MISC by Does not apply route. Every 10 days     DULoxetine (CYMBALTA) 60 MG capsule TAKE 1 CAPSULE(60 MG) BY MOUTH DAILY 90 capsule 0   ELIQUIS 5 MG TABS tablet TAKE 1 TABLET BY MOUTH TWICE DAILY 180 tablet 0   Empagliflozin-metFORMIN HCl ER (SYNJARDY XR) 25-1000 MG TB24 Take 1 tablet by mouth daily. 90 tablet 0   gabapentin (NEURONTIN)  300 MG capsule TAKE 2 CAPSULES BY MOUTH AT NIGHT AS NEEDED FOR RESTLESS LEGS OR SYMPTOMS (Patient taking differently: Take 600 mg by mouth at bedtime as needed (restless legs or symptoms).) 180 capsule 3   HM LIDOCAINE PATCH EX Apply 1 patch topically as needed (pain).     Insulin Pen Needle (NOVOFINE PEN NEEDLE) 32G X 6 MM MISC Use 1-4 times daily as needed for insulin 100 each 3   Lancets (ONETOUCH ULTRASOFT) lancets Use 1-4 times daily as needed/directed  DX E11.9 200 each 12   Magnesium Oxide 400 MG CAPS Take 1 capsule (400 mg total) by mouth daily. 90 capsule 1   metoprolol tartrate (LOPRESSOR) 50 MG tablet TAKE 1  TABLET(50 MG) BY MOUTH TWICE DAILY 60 tablet 5   montelukast (SINGULAIR) 10 MG tablet Take 1 tablet (10 mg total) by mouth at bedtime. 90 tablet 0   rosuvastatin (CRESTOR) 40 MG tablet TAKE 1 TABLET(40 MG) BY MOUTH DAILY 90 tablet 0   SKYRIZI PEN 150 MG/ML SOAJ Inject into the skin. Takes every 3 months     sodium chloride (OCEAN) 0.65 % SOLN nasal spray Place 1 spray into both nostrils as needed for congestion.     Spacer/Aero-Holding Dorise Bullion Use with inhaler 1 each 1   thiamine (VITAMIN B-1) 100 MG tablet Take 500 mg by mouth daily.     TOUJEO SOLOSTAR 300 UNIT/ML Solostar Pen INJECT 60 UNITS INTO THE SKIN EVERY DAY (Patient taking differently: Inject 52 Units into the skin daily.) 33 mL 3   traZODone (DESYREL) 50 MG tablet TAKE 1/2 TO 1 TABLET(25 TO 50 MG) BY MOUTH AT BEDTIME AS NEEDED FOR SLEEP 30 tablet 1   arformoterol (BROVANA) 15 MCG/2ML NEBU Take 2 mLs (15 mcg total) by nebulization 2 (two) times daily. 120 mL 6   revefenacin (YUPELRI) 175 MCG/3ML nebulizer solution Take 3 mLs (175 mcg total) by nebulization daily. 40 mL 3   No current facility-administered medications for this visit.    Allergies:   Amoxicillin, Augmentin [amoxicillin-pot clavulanate], Azithromycin, Clindamycin/lincomycin, Keflex [cephalexin], and Xarelto [rivaroxaban]    Social History:  The  patient  reports that he quit smoking about 44 years ago. His smoking use included cigarettes. He has a 7.00 pack-year smoking history. He has never used smokeless tobacco. He reports current alcohol use of about 3.0 - 4.0 standard drinks of alcohol per week. He reports that he does not use drugs.   Family History:  The patient's family history includes Cancer in his father; Colon cancer in his mother; Heart disease in his paternal grandfather and paternal grandmother.    ROS:  Please see the history of present illness.   Otherwise, review of systems are positive for none.   All other systems are reviewed and negative.    PHYSICAL EXAM: VS:  Ht '5\' 10"'$  (1.778 m)   Wt 229 lb (103.9 kg)   BMI 32.86 kg/m  , BMI Body mass index is 32.86 kg/m. GEN: Well nourished, obese, in no acute distress  HEENT: normal  Neck: no JVD, carotid bruits, or masses Cardiac: RRR; no murmurs, rubs, or gallops,no edema  Respiratory:  clear to auscultation bilaterally, normal work of breathing GI: soft, nontender, nondistended, + BS MS: no deformity or atrophy  Skin: warm and dry, no rash Neuro:  Strength and sensation are intact Psych: euthymic mood, full affect   EKG:  EKG is not ordered today. The ekg ordered today demonstrates N/A   Recent Labs: 08/02/2021: Magnesium 2.0 08/05/2021: Hemoglobin 13.7; Platelets 283.0 01/27/2022: ALT 32; BUN 12; Creatinine, Ser 1.01; Potassium 4.0; Sodium 141; TSH 1.652    Lipid Panel    Component Value Date/Time   CHOL 137 09/23/2020 1216   CHOL 142 08/08/2019 1058   TRIG 280 (H) 09/23/2020 1216   HDL 40 09/23/2020 1216   HDL 37 (L) 08/08/2019 1058   CHOLHDL 3.4 09/23/2020 1216   VLDL 33 02/21/2020 0334   LDLCALC 64 09/23/2020 1216   LDLDIRECT 124.0 06/23/2018 1118      Wt Readings from Last 3 Encounters:  06/11/22 229 lb (103.9 kg)  06/03/22 233 lb 6.4 oz (105.9 kg)  04/21/22 231 lb (104.8 kg)  Other studies Reviewed: Additional studies/ records  that were reviewed today include:  Adam 03/30/18: Study Conclusions   - Left ventricle: The cavity size was normal. Wall thickness was   normal. Systolic function was normal. The estimated ejection   fraction was in the range of 55% to 60%. Wall motion was normal;   there were no regional wall motion abnormalities. - Mitral valve: Mildly calcified annulus. - Left atrium: No evidence of thrombus in the atrial cavity or   appendage. No spontaneous Adam contrast was observed. The   appendage was morphologically a left appendage, multilobulated,   and of normal size. Emptying velocity was mildly reduced. - Right atrium: No evidence of thrombus in the atrial cavity or   appendage. - Atrial septum: There was a very small patent foramen ovale.   Doppler showed a trivial left-to-right shunt, in the baseline   state.  Event monitor 03/07/19: Study Highlights  Sinus rhythm Rare premature ventricular contractions Nonsustained ventricular tachycardia No sustained arrhythmias No atrial fibrillation Baseline artifact limits interpretation at times.   Adam 02/11/20: IMPRESSIONS     1. Technically difficult study with limited views and patient  uncooperative. Grossly normal LV systolic function. Left ventricular  ejection fraction, by estimation, is 60 to 65%. Image quality is not  sufficient to assess for regional wall motiuon  abnormalities. There is mild left ventricular hypertrophy. Left  ventricular diastolic parameters are indeterminate.   2. Right ventricle is pooly visualized but grossly normal size with  mildly reduced systolic function.   3. The mitral valve is normal in structure. Mild mitral valve  regurgitation.   4. The aortic valve was not well visualized. Aortic valve regurgitation  is not visualized. No aortic stenosis is present.   CT ANGIOGRAPHY CHEST WITH CONTRAST   TECHNIQUE: Multidetector CT imaging of the chest was performed using the standard protocol during bolus  administration of intravenous contrast. Multiplanar CT image reconstructions and MIPs were obtained to evaluate the vascular anatomy.   CONTRAST:  30m OMNIPAQUE IOHEXOL 350 MG/ML SOLN   COMPARISON:  None.   FINDINGS: Cardiovascular: Stable mild aneurysmal disease of the proximal thoracic aorta. The aortic root measures approximately 4.0-4.1 cm at the level of the sinuses of Valsalva. The ascending thoracic aorta measures 4.2 cm in greatest estimated diameter. The proximal arch measures 3.5 cm and the distal arch 2.6 cm. The descending thoracic aorta measures 2.6 cm. No evidence of aortic dissection. Stable atherosclerosis of the arch and descending thoracic aorta.   Stable calcified plaque at the origins great vessels without significant stenosis. The heart size is stable and within normal limits. Evidence of prior CABG. No pericardial fluid. Central pulmonary arteries are normal in caliber.   Mediastinum/Nodes: No enlarged mediastinal, hilar, or axillary lymph nodes. Thyroid gland, trachea, and esophagus demonstrate no significant findings.   Lungs/Pleura: There is no evidence of pulmonary edema, consolidation, pneumothorax, nodule or pleural fluid.   Upper Abdomen: Stable probable hepatic steatosis. There may be some tiny gallstones in the visualized gallbladder.   Musculoskeletal: No chest wall abnormality. No acute or significant osseous findings.   Review of the MIP images confirms the above findings.   IMPRESSION: 1. Stable mild aneurysmal disease of the proximal thoracic aorta with maximal estimated diameter of 4.2 cm. 2. Stable probable hepatic steatosis. 3. Possible tiny gallstones in the visualized gallbladder. 4. Aortic atherosclerosis.   Aortic aneurysm NOS (ICD10-I71.9).     Electronically Signed   By: GAletta EdouardM.D.   ASSESSMENT AND  PLAN:  1. Thoracic aortic aneurysm: Incidental finding on a chest CT done April 2019, measuring at 43 x 42 mm.  Repeat scan in September 2021 showed no change.  He is asymptomatic. Will arrange for repeat CT now. If no change can probably decrease surveillance to every 3 years given co morbidities.    2.  CAD: Status post CABG in 2011 done in [place]. Multiple stents prior to this. He has done well since CABG. No angina.   Last Myoview noted in our records was 2013 and was normal. Continue medical therapy with beta-blocker and statin.  He is not on aspirin due to Eliquis   3.  Paroxysmal atrial fibrillation: Followed by Dr. Rayann Heman  History of A fib ablation in June 2019. Recurrent Afib initially treated with Tikosyn but now on Amiodarone. No recurrent Afib on recent pacer check. Will update labs with LFTs and TSH.    4.  Obstructive sleep apnea: Reports intolerance of CPAP.    5. DM: management per PCP.   6. HTN. Currently well controlled.  7. Mixed HLD. On Crestor. Last lipid panel in Dec 2021. Will update today  8. Encephalopathy with Wernike's aphasia. related to history of Etoh use. Recommend complete alcohol abstinence   Disposition:   FU 6 months.  Signed, Alekzander Cardell Martinique, MD  06/11/2022 8:58 AM    [facility] [address], [place], [place], [address] Phone [phone], Fax [phone]

## 2022-06-11 ENCOUNTER — Ambulatory Visit: Payer: Medicare Other | Attending: Cardiology | Admitting: Cardiology

## 2022-06-11 ENCOUNTER — Encounter: Payer: Self-pay | Admitting: Cardiology

## 2022-06-11 VITALS — BP 130/70 | HR 70 | Ht 70.0 in | Wt 229.0 lb

## 2022-06-11 DIAGNOSIS — E1169 Type 2 diabetes mellitus with other specified complication: Secondary | ICD-10-CM

## 2022-06-11 DIAGNOSIS — I10 Essential (primary) hypertension: Secondary | ICD-10-CM | POA: Diagnosis not present

## 2022-06-11 DIAGNOSIS — E785 Hyperlipidemia, unspecified: Secondary | ICD-10-CM

## 2022-06-11 DIAGNOSIS — I25708 Atherosclerosis of coronary artery bypass graft(s), unspecified, with other forms of angina pectoris: Secondary | ICD-10-CM

## 2022-06-11 DIAGNOSIS — I7121 Aneurysm of the ascending aorta, without rupture: Secondary | ICD-10-CM

## 2022-06-11 DIAGNOSIS — I480 Paroxysmal atrial fibrillation: Secondary | ICD-10-CM

## 2022-06-11 MED ORDER — APIXABAN 5 MG PO TABS: 5.0000 mg | ORAL_TABLET | Freq: Two times a day (BID) | ORAL | 3 refills | Status: DC

## 2022-06-11 MED ORDER — ROSUVASTATIN CALCIUM 40 MG PO TABS: ORAL_TABLET | ORAL | 3 refills | Status: DC

## 2022-06-11 NOTE — Patient Instructions (Signed)
Medication Instructions:  Continue same medications *If you need a refill on your cardiac medications before your next appointment, please call your pharmacy*   Lab Work: Cmet,lipid panel,tsh today   Testing/Procedures: Chest CT    Follow-Up: At [facility], you and your health needs are our priority.  As part of our continuing mission to provide you with exceptional heart care, we have created designated Provider Care Teams.  These Care Teams include your primary Cardiologist (physician) and Advanced Practice Providers (APPs -  Physician Assistants and Nurse Practitioners) who all work together to provide you with the care you need, when you need it.  We recommend signing up for the patient portal called "MyChart".  Sign up information is provided on this After Visit Summary.  MyChart is used to connect with patients for Virtual Visits (Telemedicine).  Patients are able to view lab/test results, encounter notes, upcoming appointments, etc.  Non-urgent messages can be sent to your provider as well.   To learn more about what you can do with MyChart, go to [email or web address].    Your next appointment:  6 months   Call in Nov to schedule March appointment    The format for your next appointment: Office   Provider:  Dr.[name]   Important Information About Sugar

## 2022-06-12 LAB — COMPREHENSIVE METABOLIC PANEL
ALT: 18 IU/L (ref 0–44)
AST: 22 IU/L (ref 0–40)
Albumin/Globulin Ratio: 1.6 (ref 1.2–2.2)
Albumin: 4.4 g/dL (ref 3.8–4.8)
Alkaline Phosphatase: 81 IU/L (ref 44–121)
BUN/Creatinine Ratio: 16 (ref 10–24)
BUN: 13 mg/dL (ref 8–27)
Bilirubin Total: 0.7 mg/dL (ref 0.0–1.2)
CO2: 24 mmol/L (ref 20–29)
Calcium: 10 mg/dL (ref 8.6–10.2)
Chloride: 101 mmol/L (ref 96–106)
Creatinine, Ser: 0.79 mg/dL (ref 0.76–1.27)
Globulin, Total: 2.7 g/dL (ref 1.5–4.5)
Glucose: 122 mg/dL — ABNORMAL HIGH (ref 70–99)
Potassium: 3.7 mmol/L (ref 3.5–5.2)
Sodium: 143 mmol/L (ref 134–144)
Total Protein: 7.1 g/dL (ref 6.0–8.5)
eGFR: 95 mL/min/{1.73_m2} (ref >59)

## 2022-06-12 LAB — LIPID PANEL
Chol/HDL Ratio: 3.2 ratio (ref 0.0–5.0)
Cholesterol, Total: 119 mg/dL (ref 100–199)
HDL: 37 mg/dL — ABNORMAL LOW (ref >39)
LDL Chol Calc (NIH): 51 mg/dL (ref 0–99)
Triglycerides: 186 mg/dL — ABNORMAL HIGH (ref 0–149)
VLDL Cholesterol Cal: 31 mg/dL (ref 5–40)

## 2022-06-12 LAB — TSH: TSH: 4.17 u[IU]/mL (ref 0.450–4.500)

## 2022-06-14 DIAGNOSIS — E119 Type 2 diabetes mellitus without complications: Secondary | ICD-10-CM | POA: Diagnosis not present

## 2022-06-17 ENCOUNTER — Ambulatory Visit (HOSPITAL_BASED_OUTPATIENT_CLINIC_OR_DEPARTMENT_OTHER)
Admission: RE | Admit: 2022-06-17 | Discharge: 2022-06-17 | Disposition: A | Discharge status: Home / Self Care | Payer: Medicare Other | Source: Ambulatory Visit | Attending: Cardiology | Admitting: Cardiology

## 2022-06-17 DIAGNOSIS — E785 Hyperlipidemia, unspecified: Secondary | ICD-10-CM | POA: Insufficient documentation

## 2022-06-17 DIAGNOSIS — I25708 Atherosclerosis of coronary artery bypass graft(s), unspecified, with other forms of angina pectoris: Secondary | ICD-10-CM | POA: Diagnosis not present

## 2022-06-17 DIAGNOSIS — I7121 Aneurysm of the ascending aorta, without rupture: Secondary | ICD-10-CM | POA: Diagnosis not present

## 2022-06-17 DIAGNOSIS — E1169 Type 2 diabetes mellitus with other specified complication: Secondary | ICD-10-CM | POA: Diagnosis not present

## 2022-06-17 DIAGNOSIS — I10 Essential (primary) hypertension: Secondary | ICD-10-CM | POA: Insufficient documentation

## 2022-06-17 MED ORDER — IOHEXOL 350 MG/ML SOLN
100.0000 mL | Freq: Once | INTRAVENOUS | Status: AC | PRN
  Administered 2022-06-17: 100 mL via INTRAVENOUS

## 2022-06-19 NOTE — Progress Notes (Signed)
[facility] Summary Report / Loop Recorder 

## 2022-06-28 ENCOUNTER — Ambulatory Visit (INDEPENDENT_AMBULATORY_CARE_PROVIDER_SITE_OTHER): Payer: Medicare Other

## 2022-06-28 DIAGNOSIS — I480 Paroxysmal atrial fibrillation: Secondary | ICD-10-CM | POA: Diagnosis not present

## 2022-06-28 DIAGNOSIS — I712 Thoracic aortic aneurysm, without rupture, unspecified: Secondary | ICD-10-CM

## 2022-06-28 LAB — CUP PACEART REMOTE DEVICE CHECK
Date Time Interrogation Session: 20230922231047
Implantable Pulse Generator Implant Date: 20200928

## 2022-06-29 ENCOUNTER — Other Ambulatory Visit: Payer: Self-pay | Admitting: Family Medicine

## 2022-07-01 ENCOUNTER — Other Ambulatory Visit: Payer: Self-pay | Admitting: Family Medicine

## 2022-07-01 ENCOUNTER — Ambulatory Visit (INDEPENDENT_AMBULATORY_CARE_PROVIDER_SITE_OTHER): Payer: Medicare Other | Admitting: *Deleted

## 2022-07-01 DIAGNOSIS — Z23 Encounter for immunization: Secondary | ICD-10-CM | POA: Diagnosis not present

## 2022-07-08 NOTE — Progress Notes (Signed)
[facility] Summary Report / Loop Recorder 

## 2022-07-14 DIAGNOSIS — E119 Type 2 diabetes mellitus without complications: Secondary | ICD-10-CM | POA: Diagnosis not present

## 2022-07-23 ENCOUNTER — Ambulatory Visit (INDEPENDENT_AMBULATORY_CARE_PROVIDER_SITE_OTHER): Payer: Medicare Other | Admitting: Family Medicine

## 2022-07-23 VITALS — BP 130/70 | HR 68 | Temp 97.7°F | Wt 222.3 lb

## 2022-07-23 DIAGNOSIS — E1142 Type 2 diabetes mellitus with diabetic polyneuropathy: Secondary | ICD-10-CM | POA: Diagnosis not present

## 2022-07-23 DIAGNOSIS — E512 Wernicke's encephalopathy: Secondary | ICD-10-CM

## 2022-07-23 DIAGNOSIS — J3489 Other specified disorders of nose and nasal sinuses: Secondary | ICD-10-CM

## 2022-07-23 DIAGNOSIS — F1010 Alcohol abuse, uncomplicated: Secondary | ICD-10-CM | POA: Diagnosis not present

## 2022-07-23 LAB — POCT GLYCOSYLATED HEMOGLOBIN (HGB A1C): Hemoglobin A1C: 5.5 % (ref 4.0–5.6)

## 2022-07-23 NOTE — Patient Instructions (Signed)
A1 C today is excellent at 5.5%  Gradually reduce the Toujeo, as discussed-  couple of units per week if glucose stable.    I will be looking into MRI brain to further assess.

## 2022-07-23 NOTE — Progress Notes (Unsigned)
Established Patient Office Visit  Subjective   Patient ID: Adam [name], male    DOB: 10-12-1950  Age: 71 y.o. MRN: [id number]  Chief Complaint  Patient presents with   Follow-up   Diabetes   Hyperlipidemia    HPI  {History (Optional):23778} [name] is accompanied today by his wife.  He has multiple chronic medical problems.  He has history of CAD, hypertension, atrial fibrillation, COPD, GERD, hyperlipidemia, chronic ongoing alcohol abuse, history of gout, history of Warnicke encephalopathy with progressive speech difficulties.  They recently started Dexcom meter and has had very good control.  No recent hypoglycemic symptoms.  He is currently taking Synjardy 25-1001 tablet daily.  Also on Toujeo 52 units daily.  He has reduced dose some.  Needs urine microalbumin.  He specifically comes in today with concerns for possible "CSF leak "right naris greater than left.  Has had some previous falls but no recent head injury.  He has not any sneezing.  Has not tried any antihistamines.  He has had some progressive headaches.  No nosebleeds.  No fever.  No purulent secretions.  Past Medical History:  Diagnosis Date   Alcohol withdrawal ([place]) 10/24/2019   Allergy    Anxiety    history of PTSD following CABG   Ascending aortic aneurysm ([place]) 01/31/2018   43 x 42 mm, pt unaware   Asthma    Cardiomegaly 10/17/2017   Colitis- colonoscopy 2014 07/13/2015   COPD (chronic obstructive pulmonary disease) ([place])    Coronary artery disease    x 6   Depression    Diabetes mellitus without complication ([place])    Family history of polyps in the colon    Finger dislocation    Left pinkie   GERD (gastroesophageal reflux disease)    Gout    H/O atrial fibrillation without current medication    following CABG with no documented episodes since then.   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   Past Surgical History:  Procedure Laterality Date   ANKLE FRACTURE SURGERY Right 1991   APPENDECTOMY     ATRIAL FIBRILLATION ABLATION N/A 03/31/2018   Procedure: ATRIAL FIBRILLATION ABLATION;  Surgeon: [name], MD;  Location: [facility];  Service: Cardiovascular;  Laterality: N/A;   CARDIAC ELECTROPHYSIOLOGY MAPPING AND ABLATION     CARDIOVERSION N/A 03/23/2021   Procedure: CARDIOVERSION;  Surgeon: Acie Fredrickson Wonda Cheng, MD;  Location: [facility];  Service: Cardiovascular;  Laterality: N/A;   COLONOSCOPY  06/11/2021   COLONOSCOPY W/ BIOPSIES  2017   x7   CORONARY ANGIOPLASTY WITH STENT PLACEMENT     CORONARY ARTERY BYPASS GRAFT     FINGER SURGERY  04/2018   Small finger left hand   implantable loop recorder placement  07/02/2019   Medtronic Reveal [place] model LNQ11 (SN [id number]) implanted in office by Dr Rayann Heman   TEE WITHOUT CARDIOVERSION N/A 03/30/2018   Procedure: TRANSESOPHAGEAL ECHOCARDIOGRAM (TEE);  Surgeon: [name], MD;  Location: [facility];  Service: Cardiovascular;  Laterality: N/A;   TEE WITHOUT CARDIOVERSION N/A 03/23/2021   Procedure: TRANSESOPHAGEAL ECHOCARDIOGRAM (TEE);  Surgeon: Thayer Headings, MD;  Location[facility];  Service: Cardiovascular;  Laterality: N/A;   TOTAL HIP ARTHROPLASTY Left    TOTAL HIP ARTHROPLASTY Right 09/12/2018   Procedure: TOTAL HIP ARTHROPLASTY ANTERIOR APPROACH;  Surgeon: [name], MD;  Location: [facility];  Service: Orthopedics;  Laterality: Right;  70 mins    reports that he quit smoking about 44 years ago. His smoking use included cigarettes. He has a 7.00 pack-year smoking history. He has never used smokeless tobacco. He reports current alcohol use of about 3.0 - 4.0 standard drinks of alcohol per week. He reports that he  does not use drugs. family history includes Cancer in his father; Colon cancer in his mother; Heart disease in his paternal grandfather and paternal grandmother. Allergies  Allergen Reactions   Amoxicillin Rash and Other (See Comments)    RASH IN GROIN AREA Has patient had a PCN reaction causing immediate rash, facial/tongue/throat swelling, SOB or lightheadedness with hypotension: no Has patient had a PCN reaction causing severe rash involving mucus membranes or skin necrosis: no Has patient had a PCN reaction that required hospitalization: no Has patient had a PCN reaction occurring within the last 10 years: yes If all of the above answers are "NO", then may proceed with Cephalosporin use.    Augmentin [Amoxicillin-Pot Clavulanate] Rash and Other (See Comments)    RASH IN GROIN AREA   Azithromycin Rash and Other (See Comments)    RASH IN GROIN AREA   Clindamycin/Lincomycin Rash   Keflex [Cephalexin] Rash   Xarelto [Rivaroxaban] Other (See Comments)    Patient stated he "ended up in the hospital with a rectal bleed"    Review of Systems  Constitutional:  Negative for fever and weight loss.  HENT:  Negative for ear discharge, ear pain, nosebleeds, sinus pain and sore throat.   Eyes:  Negative for blurred vision.  Respiratory:  Negative for shortness of breath.   Cardiovascular:  Negative for chest pain.  Neurological:  Negative for dizziness, weakness and headaches.      Objective:     BP 130/70 (BP Location: Left Arm, Patient Position: Sitting, Cuff Size: Normal)   Pulse 68   Temp 97.7 F (36.5 C) (Oral)   Wt 222 lb 4.8 oz (100.8 kg)   SpO2 98%   BMI 31.90 kg/m  {Vitals History (Optional):23777}  Physical Exam Vitals reviewed.  Constitutional:      Appearance: He is well-developed.     Comments: Essentially unable to communicate verbally.  He brings in marker board to communicate with  HENT:     Right Ear: External ear normal.     Left Ear: External ear normal.   Eyes:     Pupils: Pupils are equal, round, and reactive to light.  Neck:     Thyroid: No thyromegaly.  Cardiovascular:     Rate and Rhythm: Normal rate and regular rhythm.  Pulmonary:     Effort: Pulmonary effort is normal. No respiratory distress.     Breath sounds: Normal breath sounds. No wheezing or rales.  Musculoskeletal:     Cervical back: Neck supple.  Neurological:     Mental Status: He is alert and oriented to person, place, and time.      Results for orders placed or performed in visit on 07/23/22  POC HgB A1c  Result Value Ref Range   Hemoglobin A1C 5.5 4.0 - 5.6 %   HbA1c POC (<> result, manual entry)     HbA1c, POC (prediabetic range)     HbA1c, POC (controlled diabetic range)      {  Labs (Optional):23779}  The ASCVD Risk score (Arnett DK, et al., 2019) failed to calculate for the following reasons:   The valid total cholesterol range is 130 to 320 mg/dL    Assessment & Plan:   #1 type 2 diabetes very well controlled with A1c today 5.5%.  Reduce Toujeo couple units every week or so as long as blood sugars remaining well controlled.  He is able to monitor now with Dexcom meter which makes real-time monitoring possible. -Check urine micro-albumin -Continue yearly diabetic eye exam  #2 rhinorrhea.  He has had some progressive headaches and is concerned about central nervous system leak.  Consider further imaging possibly with MRI brain.  #3 longstanding history of alcohol abuse.  We have had many discussions previously but he remains reluctant to stop   No follow-ups on file.    Carolann Littler, MD

## 2022-07-26 ENCOUNTER — Telehealth: Payer: Self-pay

## 2022-07-26 NOTE — Telephone Encounter (Signed)
-----   Message from [name], MD sent at 07/24/2022  3:17 PM EDT ----- Keeon Zurn,  Will you have patient schedule lab for nasal rhinorrhea swab.   I have ordered the appropriate lab and this will be a swab of nasal rhinorrhea.   Let me know if you or lab have any questions.    We are ordering a beta-2 transferrin (protein in nasal discharge that would help detect a CSF leak).

## 2022-07-26 NOTE — Telephone Encounter (Signed)
Lab appointment scheduled 

## 2022-07-27 ENCOUNTER — Telehealth: Payer: Self-pay | Admitting: Pharmacist

## 2022-07-27 NOTE — Chronic Care Management (AMB) (Signed)
Chronic Care Management Pharmacy Assistant   Name: Adam [name]  MRN: [id number] DOB: [birth year]  Reason for Encounter: Disease State   Conditions to be addressed/monitored: HTN  Recent office visits:  07/23/22 [name], MD - Patient presented for controlled type 2 diabetes mellitus with diabetic polyneuropathy without long term current use of insulin and other concerns. Advised to gradually decrease Toujeo.  Recent consult visits:  06/17/22 Patient presented to [facility] for CT Angio Chest Aorta W/CM & OR.   06/11/22 [place], [name], MD (Cardiology) - Patient presented for Aneurysm of ascending aorta without rupture and other concerns. Changed Apixaban. Stopped Breztri.  06/03/22 [name], MD (Pulmonary Disease) - Patient presented for COPD with asthma. Noted to hold of in BD therapy for now. Advised to use albuterol as needed.  Hospital visits:  None in previous 6 months  Medications: Outpatient Encounter Medications as of 07/27/2022  Medication Sig   albuterol (PROVENTIL) (2.5 MG/3ML) 0.083% nebulizer solution Take 3 mLs (2.5 mg total) by nebulization every 6 (six) hours as needed for wheezing or shortness of breath. (Patient not taking: Reported on 07/23/2022)   allopurinol (ZYLOPRIM) 300 MG tablet TAKE 1 TABLET(300 MG) BY MOUTH DAILY   amiodarone (PACERONE) 200 MG tablet TAKE 1 TABLET(200 MG) BY MOUTH DAILY   apixaban (ELIQUIS) 5 MG TABS tablet Take 1 tablet (5 mg total) by mouth 2 (two) times daily.   arformoterol (BROVANA) 15 MCG/2ML NEBU Take 2 mLs (15 mcg total) by nebulization 2 (two) times daily. (Patient not taking: Reported on 07/23/2022)   B COMPLEX-C-FOLIC ACID PO Take 1 tablet by mouth daily.   budesonide (PULMICORT) 0.5 MG/2ML nebulizer solution Take 2 mLs (0.5 mg total) by nebulization 2 (two) times daily. (Patient not taking: Reported on 07/23/2022)   Continuous Blood Gluc Sensor (DEXCOM G7 SENSOR) MISC by Does not apply  route. Every 10 days   DULoxetine (CYMBALTA) 60 MG capsule TAKE 1 CAPSULE(60 MG) BY MOUTH DAILY   Empagliflozin-metFORMIN HCl ER (SYNJARDY XR) 25-1000 MG TB24 Take 1 tablet by mouth daily.   gabapentin (NEURONTIN) 300 MG capsule TAKE 2 CAPSULES BY MOUTH AT NIGHT AS NEEDED FOR RESTLESS LEGS OR SYMPTOMS   HM LIDOCAINE PATCH EX Apply 1 patch topically as needed (pain).   Insulin Pen Needle (NOVOFINE PEN NEEDLE) 32G X 6 MM MISC Use 1-4 times daily as needed for insulin   Magnesium Oxide 400 MG CAPS Take 1 capsule (400 mg total) by mouth daily.   metoprolol tartrate (LOPRESSOR) 50 MG tablet TAKE 1 TABLET(50 MG) BY MOUTH TWICE DAILY   montelukast (SINGULAIR) 10 MG tablet Take 1 tablet (10 mg total) by mouth at bedtime.   revefenacin (YUPELRI) 175 MCG/3ML nebulizer solution Take 3 mLs (175 mcg total) by nebulization daily. (Patient not taking: Reported on 07/23/2022)   rosuvastatin (CRESTOR) 40 MG tablet TAKE 1 TABLET(40 MG) BY MOUTH DAILY   SKYRIZI PEN 150 MG/ML SOAJ Inject into the skin. Takes every 3 months   sodium chloride (OCEAN) 0.65 % SOLN nasal spray Place 1 spray into both nostrils as needed for congestion.   Spacer/Aero-Holding Dorise Bullion Use with inhaler (Patient not taking: Reported on 07/23/2022)   thiamine (VITAMIN B-1) 100 MG tablet Take 500 mg by mouth daily.   TOUJEO SOLOSTAR 300 UNIT/ML Solostar Pen INJECT 60 UNITS INTO THE SKIN EVERY DAY (Patient taking differently: Inject 52 Units into the skin daily.)   traZODone (DESYREL) 50 MG tablet TAKE 1/2 TO  1 TABLET(25 TO 50 MG) BY MOUTH AT BEDTIME AS NEEDED FOR SLEEP   No facility-administered encounter medications on file as of 07/27/2022.  Reviewed chart prior to disease state call. Spoke with patient regarding BP  Recent Office Vitals: BP Readings from Last 3 Encounters:  07/23/22 130/70  06/11/22 130/70  06/03/22 136/74   Pulse Readings from Last 3 Encounters:  07/23/22 68  06/11/22 70  06/03/22 64    Wt Readings from  Last 3 Encounters:  07/23/22 222 lb 4.8 oz (100.8 kg)  06/11/22 229 lb (103.9 kg)  06/03/22 233 lb 6.4 oz (105.9 kg)     Kidney Function Lab Results  Component Value Date/Time   CREATININE 0.79 06/11/2022 09:20 AM   CREATININE 1.01 01/27/2022 12:16 PM   CREATININE 0.93 09/23/2020 12:16 PM   GFR 70.80 08/05/2021 03:57 PM   GFRNONAA >60 01/27/2022 12:16 PM   GFRAA 115 08/22/2020 09:21 AM       Latest Ref Rng & Units 06/11/2022    9:20 AM 01/27/2022   12:16 PM 08/05/2021    3:57 PM  BMP  Glucose 70 - 99 mg/dL 122  109  178   BUN 8 - 27 mg/dL '13  12  10   '$ Creatinine 0.76 - 1.27 mg/dL 0.79  1.01  1.06   BUN/Creat Ratio 10 - 24 16     Sodium 134 - 144 mmol/L 143  141  140   Potassium 3.5 - 5.2 mmol/L 3.7  4.0  3.8   Chloride 96 - 106 mmol/L 101  106  103   CO2 20 - 29 mmol/L '24  26  28   '$ Calcium 8.6 - 10.2 mg/dL 10.0  9.0  9.8     Current antihypertensive regimen:  BP Readings from Last 3 Encounters:  07/23/22 130/70  06/11/22 130/70  06/03/22 136/74   How often are you checking your Blood Pressure? weekly Notes: Per [name] Confirmed with patient's wife that he has stopped the Nebulizer medications per Pulmonology and is only using the albuterol. Wife reports he decided it was not going to work for him and she thinks it was confusing him so they are not using them and Pulmonology is aware. She reports they just saw PCP on yesterday and he looked at pt's Dexcom readings in office, she was in agreement for follow up in a few months to do this again with Pharmacist.    Care Gaps: Medicare AWV - DONE 12/30/21 Foot Exam - Overdue COVID Booster - Overdue Eye Exam - Overdue TDAP - Postponed BP- 130/70 07/23/22 CCM-  11/10/21 in off Lab Results  Component Value Date   HGBA1C 5.5 07/23/2022    Star Rating Drugs: Empagliflozin-metformin (Synjardy) 25 mg - Last filled 04/30/22 90 DS at [facility] Rosuvastatin 40 mg - Last filled 06/11/22 90 DS at [facility]

## 2022-07-29 ENCOUNTER — Ambulatory Visit (HOSPITAL_COMMUNITY)
Admission: RE | Admit: 2022-07-29 | Discharge: 2022-07-29 | Disposition: A | Discharge status: Home / Self Care | Payer: Medicare Other | Source: Ambulatory Visit | Attending: Nurse Practitioner | Admitting: Nurse Practitioner

## 2022-07-29 ENCOUNTER — Encounter (HOSPITAL_COMMUNITY): Payer: Self-pay | Admitting: Nurse Practitioner

## 2022-07-29 VITALS — BP 146/84 | HR 67 | Ht 70.0 in | Wt 220.2 lb

## 2022-07-29 DIAGNOSIS — G4733 Obstructive sleep apnea (adult) (pediatric): Secondary | ICD-10-CM | POA: Diagnosis not present

## 2022-07-29 DIAGNOSIS — Z7901 Long term (current) use of anticoagulants: Secondary | ICD-10-CM | POA: Diagnosis not present

## 2022-07-29 DIAGNOSIS — E669 Obesity, unspecified: Secondary | ICD-10-CM | POA: Diagnosis not present

## 2022-07-29 DIAGNOSIS — E785 Hyperlipidemia, unspecified: Secondary | ICD-10-CM | POA: Insufficient documentation

## 2022-07-29 DIAGNOSIS — I2510 Atherosclerotic heart disease of native coronary artery without angina pectoris: Secondary | ICD-10-CM | POA: Insufficient documentation

## 2022-07-29 DIAGNOSIS — I719 Aortic aneurysm of unspecified site, without rupture: Secondary | ICD-10-CM | POA: Insufficient documentation

## 2022-07-29 DIAGNOSIS — D6869 Other thrombophilia: Secondary | ICD-10-CM

## 2022-07-29 DIAGNOSIS — E512 Wernicke's encephalopathy: Secondary | ICD-10-CM | POA: Diagnosis not present

## 2022-07-29 DIAGNOSIS — I480 Paroxysmal atrial fibrillation: Secondary | ICD-10-CM

## 2022-07-29 DIAGNOSIS — E119 Type 2 diabetes mellitus without complications: Secondary | ICD-10-CM | POA: Insufficient documentation

## 2022-07-29 DIAGNOSIS — I10 Essential (primary) hypertension: Secondary | ICD-10-CM | POA: Insufficient documentation

## 2022-07-29 DIAGNOSIS — Z6831 Body mass index (BMI) 31.0-31.9, adult: Secondary | ICD-10-CM | POA: Insufficient documentation

## 2022-07-29 DIAGNOSIS — I4819 Other persistent atrial fibrillation: Secondary | ICD-10-CM | POA: Diagnosis not present

## 2022-07-29 DIAGNOSIS — L409 Psoriasis, unspecified: Secondary | ICD-10-CM | POA: Diagnosis not present

## 2022-07-29 DIAGNOSIS — F1090 Alcohol use, unspecified, uncomplicated: Secondary | ICD-10-CM | POA: Insufficient documentation

## 2022-07-29 NOTE — Progress Notes (Addendum)
Primary Care Physician: [name], MD Referring Physician: Dr. Rayann Heman Primary Cardiologist: Dr [place]  Adam [name] is a 71 y.o. male with a h/o  CABG in 2011, done in [place], HTN, HLD, DM, Atrial fibrillation,alcohol use, and OSA. He underwent catheter ablation for atrial fibrillation by Dr. Rayann Heman 03/31/18. He was followed by Dr. Radford Pax in sleep clinic, but unable to tolerate CPAP and turned in his equipment. He was previously on Xarelto for a CHADS2VASC score of 3. Patient was admitted 10/24/19 for GI bleeding and alcohol withdrawal. EP was consulted to assist with dofetilide reloading. His Xarelto was held on admission and was not resumed at discharge, he was eventually placed back on eliquis.  He was discharged to rehab for a prolonged period of time for Wernicke's encephalopathy.   He is now back in the afib clinic for pt feeling rapid heart rate.. By his device, he had had 3 episodes of afib.Fastest rate was 146 bpm, with longest duration of 44 mins. Recent burden was actually improved at 9.4%, previously at 14%. Presenting rhythm for the interpretation was in rapid a  Flutter and he continues in this rhythm today. He continues o drink alcohol, when asked the amount, he states "enough". He continues to have untreated OSA.   F/u in afib clinic, 03/31/21. He had a hospitalization for afib with [facility], 6-17 thru 6-21, worsened with not taking cardizem for 3 days as the drug store was out of it per pt. He was having ongoing alcohol abuse. Last alcohol was that am, wine with breakfast. He had some slurred speech, left facial droop that had occurred days earlier, PCP obtained MRI, negative for stroke.EP was consulted doe RVR, cardizem drip started and the decision was made to stop Tikosyn and start amio load. Pt was on amio in  the remote past, had issues with skin discoloration but EP did not see any alternative drug options. Hospitalization was complicated by alcohol withdrawal.   In the office  today, afib is rate controlled. He continues on amiodarone 400 mg bid, x 2 weeks. He states he has missed a few doses of DOAC, reminded not to do this with pending cardioversion. He feels improved.   F/u in afib clinic 04/21/21 after an ER visit for palpitations and found to be in afib. He had missed several dose of amiodarone and eliquis the week before. Planned for reloading of amiodarone and sent home. He is in SR today. He has been on 200 mg bid for the last 2 weeks. He will lower to 200 mg daily now. His is describing some constipation with amiodarone.   F/u in afib clinic, 07/30/21 for surveillance of amiodarone. He remains in SR but with a prolonged qtc at 592 ms. He denies any new drugs or benadryl use. His wife states that he intake of alcohol has worsened. He has fallen numerous times and his speech is slurred today. This has been getting worse over several  weeks time. He saw his PCP last week, had same symptoms and was  referred to neurology, appointment next week. He states he drinks 3-4 beverage glass size mixed  scotch drinks a day. He has not noted any afib.   F/u in afib clinic, 01/27/22. He is in the clinic for amiodarone surveillance. He indicates that he has not noted any afib. He is in SR today. The biggest change I see today is that his speech is very garbled, only being able to make out a few words. His  wife states that it is progression of  Wernicke's encephalopathy. He continues  with abuse alcohol. He has occasional falls. He is requesting imaging of his aortic aneurysm be done. It has been almost 2 years insce last CT. Dr. Radford Pax ordered it  last but he no longer sees her as he turned in his CPAP and does not treat his afib.    F/u in afib clinic, 07/29/22. He is almost non verbal now from  the consequences of his alcoholism and  Wernicke's encephalopathy. He continues  on amiodarone and EKG shows SR. He has not had any afib that he is aware of. He is here with his wife and is very  alert, answers questions with non verbal hand/facial movements.      Today, he denies symptoms of palpitations, chest pain, mild increase of shortness of breath and fatigue,  orthopnea, PND, lower extremity edema, dizziness, presyncope, syncope, or neurologic sequela. The patient is tolerating medications without difficulties and is otherwise without complaint today.   Past Medical History:  Diagnosis Date   Alcohol withdrawal ([place]) 10/24/2019   Allergy    Anxiety    history of PTSD following CABG   Ascending aortic aneurysm ([place]) 01/31/2018   43 x 42 mm, pt unaware   Asthma    Cardiomegaly 10/17/2017   Colitis- colonoscopy 2014 07/13/2015   COPD (chronic obstructive pulmonary disease) ([place])    Coronary artery disease    x 6   Depression    Diabetes mellitus without complication ([place])    Family history of polyps in the colon    Finger dislocation    Left pinkie   GERD (gastroesophageal reflux disease)    Gout    H/O atrial fibrillation without current medication    following CABG with no documented episodes since then.   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   Past Surgical History:  Procedure Laterality Date   ANKLE FRACTURE SURGERY Right 1991   APPENDECTOMY     ATRIAL FIBRILLATION ABLATION N/A 03/31/2018   Procedure: ATRIAL FIBRILLATION ABLATION;  Surgeon: [name], MD;  Location: [facility];  Service: Cardiovascular;  Laterality: N/A;   CARDIAC ELECTROPHYSIOLOGY MAPPING AND ABLATION     CARDIOVERSION N/A 03/23/2021   Procedure: CARDIOVERSION;  Surgeon: Acie Fredrickson Wonda Cheng, MD;  Location: [facility];  Service: Cardiovascular;  Laterality: N/A;   COLONOSCOPY   06/11/2021   COLONOSCOPY W/ BIOPSIES  2017   x7   CORONARY ANGIOPLASTY WITH STENT PLACEMENT     CORONARY ARTERY BYPASS GRAFT     FINGER SURGERY  04/2018   Small finger left hand   implantable loop recorder placement  07/02/2019   Medtronic Reveal [place] model LNQ11 (SN [id number]) implanted in office by Dr Rayann Heman   TEE WITHOUT CARDIOVERSION N/A 03/30/2018   Procedure: TRANSESOPHAGEAL ECHOCARDIOGRAM (TEE);  Surgeon: [name], MD;  Location: [facility];  Service: Cardiovascular;  Laterality: N/A;   TEE WITHOUT CARDIOVERSION N/A 03/23/2021   Procedure: TRANSESOPHAGEAL ECHOCARDIOGRAM (TEE);  Surgeon: Acie Fredrickson Wonda Cheng, MD;  Location: [facility];  Service: Cardiovascular;  Laterality: N/A;   TOTAL HIP ARTHROPLASTY Left    TOTAL HIP  ARTHROPLASTY Right 09/12/2018   Procedure: TOTAL HIP ARTHROPLASTY ANTERIOR APPROACH;  Surgeon: [name], MD;  Location: [facility];  Service: Orthopedics;  Laterality: Right;  70 mins    Current Outpatient Medications  Medication Sig Dispense Refill   allopurinol (ZYLOPRIM) 300 MG tablet TAKE 1 TABLET(300 MG) BY MOUTH DAILY 90 tablet 0   amiodarone (PACERONE) 200 MG tablet TAKE 1 TABLET(200 MG) BY MOUTH DAILY 90 tablet 1   apixaban (ELIQUIS) 5 MG TABS tablet Take 1 tablet (5 mg total) by mouth 2 (two) times daily. 180 tablet 3   B COMPLEX-C-FOLIC ACID PO Take 1 tablet by mouth daily.     Continuous Blood Gluc Sensor (DEXCOM G7 SENSOR) MISC by Does not apply route. Every 10 days     DULoxetine (CYMBALTA) 60 MG capsule TAKE 1 CAPSULE(60 MG) BY MOUTH DAILY 90 capsule 0   Empagliflozin-metFORMIN HCl ER (SYNJARDY XR) 25-1000 MG TB24 Take 1 tablet by mouth daily. 90 tablet 0   gabapentin (NEURONTIN) 300 MG capsule TAKE 2 CAPSULES BY MOUTH AT NIGHT AS NEEDED FOR RESTLESS LEGS OR SYMPTOMS 180 capsule 0   HM LIDOCAINE PATCH EX Apply 1 patch topically as needed (pain).     Insulin Pen Needle (NOVOFINE PEN NEEDLE) 32G X 6 MM MISC Use 1-4 times daily as needed for  insulin 100 each 3   Magnesium Oxide 400 MG CAPS Take 1 capsule (400 mg total) by mouth daily. 90 capsule 1   metoprolol tartrate (LOPRESSOR) 50 MG tablet TAKE 1 TABLET(50 MG) BY MOUTH TWICE DAILY 60 tablet 5   montelukast (SINGULAIR) 10 MG tablet Take 1 tablet (10 mg total) by mouth at bedtime. 90 tablet 0   rosuvastatin (CRESTOR) 40 MG tablet TAKE 1 TABLET(40 MG) BY MOUTH DAILY 90 tablet 3   SKYRIZI PEN 150 MG/ML SOAJ Inject into the skin. Takes every 3 months     sodium chloride (OCEAN) 0.65 % SOLN nasal spray Place 1 spray into both nostrils as needed for congestion.     thiamine (VITAMIN B-1) 100 MG tablet Take 500 mg by mouth daily.     TOUJEO SOLOSTAR 300 UNIT/ML Solostar Pen INJECT 60 UNITS INTO THE SKIN EVERY DAY (Patient taking differently: Inject 52 Units into the skin daily.) 33 mL 3   Spacer/Aero-Holding Chambers DEVI Use with inhaler (Patient not taking: Reported on 07/29/2022) 1 each 1   No current facility-administered medications for this encounter.    Allergies  Allergen Reactions   Amoxicillin Rash and Other (See Comments)    RASH IN GROIN AREA Has patient had a PCN reaction causing immediate rash, facial/tongue/throat swelling, SOB or lightheadedness with hypotension: no Has patient had a PCN reaction causing severe rash involving mucus membranes or skin necrosis: no Has patient had a PCN reaction that required hospitalization: no Has patient had a PCN reaction occurring within the last 10 years: yes If all of the above answers are "NO", then may proceed with Cephalosporin use.    Augmentin [Amoxicillin-Pot Clavulanate] Rash and Other (See Comments)    RASH IN GROIN AREA   Azithromycin Rash and Other (See Comments)    RASH IN GROIN AREA   Clindamycin/Lincomycin Rash   Keflex [Cephalexin] Rash   Xarelto [Rivaroxaban] Other (See Comments)    Patient stated he "ended up in the hospital with a rectal bleed"    Social History   Socioeconomic History   Marital  status: Married    Spouse name: Not on file   Number of children:  0   Years of education: Not on file   Highest education level: Not on file  Occupational History   Occupation: retired  Tobacco Use   Smoking status: Former    Packs/day: 1.00    Years: 7.00    Total pack years: 7.00    Types: Cigarettes    Quit date: 10/04/1977    Years since quitting: 44.8   Smokeless tobacco: Never   Tobacco comments:    never smoked over 1 pack   Vaping Use   Vaping Use: Never used  Substance and Sexual Activity   Alcohol use: Yes    Alcohol/week: 3.0 - 4.0 standard drinks of alcohol    Types: 1 Glasses of wine, 2 - 3 Shots of liquor per week    Comment: DAILY   Drug use: No    Comment: Smoked Marijuana back in 1970s   Sexual activity: Yes  Other Topics Concern   Not on file  Social History Narrative   Married, no children   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 - returned to [employer] from [place]   Enjoys bird watching, reading historical books about WWII, watching tv   Social Determinants of Health   Financial Resource Strain: Low Risk  (12/30/2021)   Overall Financial Resource Strain (CARDIA)    Difficulty of Paying Living Expenses: Not hard at all  Food Insecurity: No Food Insecurity (12/30/2021)   Hunger Vital Sign    Worried About Running Out of Food in the Last Year: Never true    [place] in the Last Year: Never true  Transportation Needs: No Transportation Needs (12/30/2021)   PRAPARE - Hydrologist (Medical): No    Lack of Transportation (Non-Medical): No  Physical Activity: Inactive (12/30/2021)   Exercise Vital Sign    Days of Exercise per Week: 0 days    Minutes of Exercise per Session: 0 min  Stress: No Stress Concern Present (12/30/2021)   [place]    Feeling of Stress : Not at all  Social Connections: Moderately Isolated  (12/30/2021)   Social Connection and Isolation Panel [NHANES]    Frequency of Communication with Friends and Family: More than three times a week    Frequency of Social Gatherings with Friends and Family: More than three times a week    Attends Religious Services: Never    Marine scientist or Organizations: No    Attends Archivist Meetings: Never    Marital Status: Married  Human resources officer Violence: Not At Risk (12/30/2021)   Humiliation, Afraid, Rape, and Kick questionnaire    Fear of Current or Ex-Partner: No    Emotionally Abused: No    Physically Abused: No    Sexually Abused: No    Family History  Problem Relation Age of Onset   Colon cancer Mother    Cancer Father        UNKNOWN TYPE   Heart disease Paternal Grandmother    Heart disease Paternal Grandfather    Esophageal cancer Neg Hx    Rectal cancer Neg Hx    Stomach cancer Neg Hx     ROS- All systems are reviewed and negative except as per the HPI above  Physical Exam: Vitals:   07/29/22 1118  BP: (!) 146/84  Pulse: 67  Weight: 99.9 kg  Height: '5\' 10"'$  (1.778 m)  Wt Readings from Last 3 Encounters:  07/29/22 99.9 kg  07/23/22 100.8 kg  06/11/22 103.9 kg    Labs: Lab Results  Component Value Date   NA 143 06/11/2022   K 3.7 06/11/2022   CL 101 06/11/2022   CO2 24 06/11/2022   GLUCOSE 122 (H) 06/11/2022   BUN 13 06/11/2022   CREATININE 0.79 06/11/2022   CALCIUM 10.0 06/11/2022   PHOS 3.2 03/20/2021   MG 2.0 08/02/2021   Lab Results  Component Value Date   INR 1.2 03/23/2021   Lab Results  Component Value Date   CHOL 119 06/11/2022   HDL 37 (L) 06/11/2022   LDLCALC 51 06/11/2022   TRIG 186 (H) 06/11/2022    GEN- The patient is well appearing obese male, alert and oriented x 3 today.   HEENT-head normocephalic, atraumatic, sclera clear, conjunctiva pink, hearing intact, trachea midline. Lungs- Clear to ausculation bilaterally, normal work of breathing Heart- regular rate  and rhythm, no murmurs, rubs or gallops  GI- soft, NT, ND, + BS Extremities- no clubbing, cyanosis, or edema MS- no significant deformity or atrophy Skin- no rash or lesion Psych- euthymic mood, full affect Neuro- strength and sensation are intact  Ekg-192 ms QRS duration 90 ms QT/QTcB 490/517 ms P-R-T axes 89 31 54 Normal sinus rhythm Low voltage QRS Cannot rule out Anterior infarct , age undetermined Prolonged QT Abnormal ECG When compared with ECG of 27-Jan-2022 11:31, PREVIOUS ECG IS PRESENT   Epic records reviewed   CXR- 08/05/21-IMPRESSION: No active cardiopulmonary disease.     Assessment and Plan: 1. Persistent afib S/p ablation 03/2018. Tikosyn stopped and amiodarone was  started 03/31/21  In SR and  qt stable  Continue amiodarone 200 mg qd Cmet/tsh done in September with normal liver enzymes and TSH in normal range Chest CT fall 2023- no acute findings for ongoing amiodarone use  Continue metoprolol tartrate 50 mg bid   This patients CHA2DS2-VASc Score and unadjusted Ischemic Stroke Rate (% per year) is equal to 4.8 % stroke rate/year from a score of 4  Continue  eliquis 5 mg bid going forward   Above score calculated as 1 point each if present [CHF, HTN, DM, Vascular=MI/PAD/Aortic Plaque, Age if 65-74, or Male] Above score calculated as 2 points each if present [Age > 75, or Stroke/TIA/TE]   2. OSA  Does not use cpap   3. CAD  No recent issues with chest pain  4. HTN Stable   5. Obesity Body mass index is 31.6 kg/m. Lifestyle modification was discussed and encouraged including regular physical activity and weight reduction. Avoidance of alcohol would be ideal   6.   Wernicke's encephalopathy Progressively getting worse to the point that his speech is barley understandable  Asked to use a cane or walker with walking with ongoing falls  7. Aortic aneurysm  CT of the chest done 06/2022 Stable and will need to be repeated in 3 years per Dr.  Martin[place]     Afib clinic in 6 months fro amiodarone surveillance     Butch Penny C. Kahdijah Errickson, [facility] [address] [place], [place] [address] [phone]

## 2022-07-30 ENCOUNTER — Other Ambulatory Visit: Payer: Self-pay | Admitting: Family Medicine

## 2022-07-30 ENCOUNTER — Other Ambulatory Visit: Payer: Medicare Other

## 2022-07-30 ENCOUNTER — Telehealth: Payer: Self-pay | Admitting: *Deleted

## 2022-07-30 DIAGNOSIS — J3489 Other specified disorders of nose and nasal sinuses: Secondary | ICD-10-CM | POA: Diagnosis not present

## 2022-07-30 DIAGNOSIS — E1142 Type 2 diabetes mellitus with diabetic polyneuropathy: Secondary | ICD-10-CM | POA: Diagnosis not present

## 2022-07-30 LAB — CUP PACEART REMOTE DEVICE CHECK
Date Time Interrogation Session: 20231025231824
Implantable Pulse Generator Implant Date: 20200928

## 2022-07-30 LAB — MICROALBUMIN / CREATININE URINE RATIO
Creatinine,U: 86.9 mg/dL
Microalb Creat Ratio: 55.9 mg/g — ABNORMAL HIGH (ref 0.0–30.0)
Microalb, Ur: 48.6 mg/dL — ABNORMAL HIGH (ref 0.0–1.9)

## 2022-07-30 NOTE — Telephone Encounter (Signed)
[name], lab tech stated the patient is here now for the beta transferrin test due to rhinorrhea and they do not collect nasal swabs on patients.  I called [facility] at [phone] and spoke with Rodena Piety to see if the patient could go to an actual [facility] lab for the swab.  Rodena Piety stated the lab does not offer swabs for the patient and this could be collected here in the office with a plain cotton swab and sent in a sterile leakproof container.  Dr Elease Hashimoto was informed of this and gave the approval for me to swab the patient.  A nasal swab was performed with a sterile cotton swab, collected in the same manner as a flu/covid testing and placed in a sterile urine cup.  Specimen was labeled and given to [facility] for further testing.

## 2022-08-02 ENCOUNTER — Ambulatory Visit (INDEPENDENT_AMBULATORY_CARE_PROVIDER_SITE_OTHER): Payer: Medicare Other

## 2022-08-02 DIAGNOSIS — I480 Paroxysmal atrial fibrillation: Secondary | ICD-10-CM | POA: Diagnosis not present

## 2022-08-02 MED ORDER — LISINOPRIL 10 MG PO TABS: 10.0000 mg | ORAL_TABLET | Freq: Every day | ORAL | 0 refills | Status: DC

## 2022-08-03 ENCOUNTER — Encounter: Payer: Self-pay | Admitting: Family Medicine

## 2022-08-05 LAB — BETA 2 TRANSFERRIN: Beta-2 Transferrin, Fluid: NOT DETECTED

## 2022-08-06 ENCOUNTER — Telehealth: Payer: Self-pay | Admitting: Family Medicine

## 2022-08-06 DIAGNOSIS — J3489 Other specified disorders of nose and nasal sinuses: Secondary | ICD-10-CM

## 2022-08-06 NOTE — Telephone Encounter (Signed)
Brandy with triage called to inform us that patient has been experiencing sputum leakage from nose, ears and throat. Theadora Rama has advised the patient's wife to seek emergency treatment for the patient regarding symptoms he has been experiencing. Theadora Rama was calling our office to confirm that the patient's wife had talked to someone in regards to this and make sure the patient will receive follow u[ from our office. Message sent to PCP for review.

## 2022-08-06 NOTE — Addendum Note (Signed)
Addended by: Nilda Riggs on: 08/06/2022 01:27 PM   Modules accepted: Orders

## 2022-08-06 NOTE — Telephone Encounter (Signed)
Caller says her husband has fluid on his brain. Fluid is dripping form right ear.  08/06/2022 8:48:35 AM Go to ED Now (or PCP triage) Durene Cal, RN, [place]  Comments User: Ermalene Postin, RN Date/Time Eilene Ghazi Time): 08/06/2022 8:46:23 AM Caller reports she is already in communication with the office. Office staff reports msg was sent MD.  User: Ermalene Postin, RN Date/Time Eilene Ghazi Time): 08/06/2022 8:48:58 AM [place]: pt is confused and non verbal per caller.  Referrals GO TO FACILITY REFUSED REFERRED TO PCP OFFICE  Provider has message in his basket; awaiting response.

## 2022-08-06 NOTE — Telephone Encounter (Signed)
Pt spouse call and want a call back about her husband she stated he in dripping from the nose and ears and want a call back. I sent her to the triage nurse.

## 2022-08-06 NOTE — Telephone Encounter (Signed)
I spoke with the patient's wife and she states the patient reports fluid leaking out of nose and right ear. Patient's wife inquired on if patient needs to have some more lab testing done?

## 2022-08-06 NOTE — Telephone Encounter (Signed)
I spoke with the patient's wife and informed her of the message below. Patient's wife stated the patient is not having any acute confusion at this time. Patient's wife would like to proceed with ENT referral and this has been placed.

## 2022-08-11 DIAGNOSIS — L820 Inflamed seborrheic keratosis: Secondary | ICD-10-CM | POA: Diagnosis not present

## 2022-08-11 DIAGNOSIS — L298 Other pruritus: Secondary | ICD-10-CM | POA: Diagnosis not present

## 2022-08-11 DIAGNOSIS — L400 Psoriasis vulgaris: Secondary | ICD-10-CM | POA: Diagnosis not present

## 2022-08-11 DIAGNOSIS — L218 Other seborrheic dermatitis: Secondary | ICD-10-CM | POA: Diagnosis not present

## 2022-08-12 ENCOUNTER — Encounter: Payer: Self-pay | Admitting: Family Medicine

## 2022-08-14 DIAGNOSIS — E119 Type 2 diabetes mellitus without complications: Secondary | ICD-10-CM | POA: Diagnosis not present

## 2022-08-30 ENCOUNTER — Telehealth: Payer: Self-pay | Admitting: *Deleted

## 2022-08-30 ENCOUNTER — Encounter: Payer: Self-pay | Admitting: *Deleted

## 2022-08-30 NOTE — Patient Instructions (Signed)
Visit Information  Thank you for taking time to visit with me today. Please don't hesitate to contact me if I can be of assistance to you.   Following are the goals we discussed today:   Goals Addressed             This Visit's Progress    COMPLETED: No needed indicated       Care Coordination Interventions: Reviewed medications with patient and discussed adherence with no needed refills Reviewed scheduled/upcoming provider appointments including sufficient transportation verified Assessed social determinant of health barriers         Please call the care guide team at [phone] if you need to cancel or reschedule your appointment.   If you are experiencing a Mental Health or [place] or need someone to talk to, please call the Suicide and Crisis Lifeline: 988  Patient verbalizes understanding of instructions and care plan provided today and agrees to view in [place]. Active MyChart status and patient understanding of how to access instructions and care plan via MyChart confirmed with patient.     No further follow up required: No needs indicated  Raina Mina, RN Care Management Coordinator [place] Office [phone]

## 2022-08-30 NOTE — Patient Outreach (Signed)
  Care Coordination   Initial Visit Note   08/30/2022 Name: Adam [name] MRN: [id number] DOB: 02-18-1951  Adam [name] is a 71 y.o. year old male who sees Adam [name] for primary care. I  spoke with wife Adam [name].  What matters to the patients health and wellness today?  No needs    Goals Addressed             This Visit's Progress    COMPLETED: No needed indicated       Care Coordination Interventions: Reviewed medications with patient and discussed adherence with no needed refills Reviewed scheduled/upcoming provider appointments including sufficient transportation verified Assessed social determinant of health barriers         SDOH assessments and interventions completed:  Yes  SDOH Interventions Today    Flowsheet Row Most Recent Value  SDOH Interventions   Food Insecurity Interventions Intervention Not Indicated  Housing Interventions Intervention Not Indicated  Transportation Interventions Intervention Not Indicated  Utilities Interventions Intervention Not Indicated        Care Coordination Interventions:  Yes, provided   Follow up plan: No further intervention required.   Encounter Outcome:  Pt. Visit Completed   Adam Mina, RN Care Management Coordinator [place] Office [phone]

## 2022-08-31 LAB — HM DIABETES EYE EXAM

## 2022-08-31 NOTE — Progress Notes (Signed)
[facility] Summary Report / Loop Recorder 

## 2022-09-01 ENCOUNTER — Encounter: Payer: Self-pay | Admitting: Family Medicine

## 2022-09-01 ENCOUNTER — Other Ambulatory Visit: Payer: Self-pay | Admitting: Family Medicine

## 2022-09-03 ENCOUNTER — Encounter: Payer: Self-pay | Admitting: Family Medicine

## 2022-09-03 ENCOUNTER — Ambulatory Visit (INDEPENDENT_AMBULATORY_CARE_PROVIDER_SITE_OTHER): Payer: Medicare Other | Admitting: Family Medicine

## 2022-09-03 VITALS — BP 128/68 | HR 62 | Temp 97.5°F | Ht 70.0 in | Wt 217.0 lb

## 2022-09-03 DIAGNOSIS — J3489 Other specified disorders of nose and nasal sinuses: Secondary | ICD-10-CM

## 2022-09-03 DIAGNOSIS — E1142 Type 2 diabetes mellitus with diabetic polyneuropathy: Secondary | ICD-10-CM | POA: Diagnosis not present

## 2022-09-03 DIAGNOSIS — E512 Wernicke's encephalopathy: Secondary | ICD-10-CM

## 2022-09-03 NOTE — Progress Notes (Unsigned)
Established Patient Office Visit  Subjective   Patient ID: Adam [name], male    DOB: [birth year]  Age: 71 y.o. MRN: [id number]  Chief Complaint  Patient presents with   Follow-up    HPI  {History (Optional):23778} Adam [name] is seen again today accompanied by his wife.  Communication is difficult because of his history of Wernicke encephalopathy and difficulties communicating verbally  He continues to complain of clear liquidy drainage right naris.  We did a beta-2 transferrin assay which came back negative.  He had concerns that he had less drainage during the period that was taken.  We did set up ENT referral and they have still not heard back anything yet.  He is not on any purulent secretions.  No bloody discharge.  No history of recent known head trauma.  Type 2 diabetes.  Excellent control with recent A1c 5.5%.  We discussed ongoing reduction in Toujeo dose as long as his blood sugars remain stable.  He is using Dexcom monitor.  Ongoing alcohol abuse.  Has essentially no motivation to quit.  Past Medical History:  Diagnosis Date   Alcohol withdrawal ([place]) 10/24/2019   Allergy    Anxiety    history of PTSD following CABG   Ascending aortic aneurysm ([place]) 01/31/2018   43 x 42 mm, pt unaware   Asthma    Cardiomegaly 10/17/2017   Colitis- colonoscopy 2014 07/13/2015   COPD (chronic obstructive pulmonary disease) ([place])    Coronary artery disease    x 6   Depression    Diabetes mellitus without complication ([place])    Family history of polyps in the colon    Finger dislocation    Left pinkie   GERD (gastroesophageal reflux disease)    Gout    H/O atrial fibrillation without current medication    following CABG with no documented episodes since then.   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   Past Surgical History:  Procedure Laterality Date   ANKLE FRACTURE SURGERY Right 1991   APPENDECTOMY     ATRIAL FIBRILLATION ABLATION N/A 03/31/2018   Procedure: ATRIAL FIBRILLATION ABLATION;  Surgeon: [name], MD;  Location: [facility];  Service: Cardiovascular;  Laterality: N/A;   CARDIAC ELECTROPHYSIOLOGY MAPPING AND ABLATION     CARDIOVERSION N/A 03/23/2021   Procedure: CARDIOVERSION;  Surgeon: Acie Fredrickson Wonda Cheng, MD;  Location: [facility];  Service: Cardiovascular;  Laterality: N/A;   COLONOSCOPY  06/11/2021   COLONOSCOPY W/ BIOPSIES  2017   x7   CORONARY ANGIOPLASTY WITH STENT PLACEMENT     CORONARY ARTERY BYPASS GRAFT     FINGER SURGERY  04/2018   Small finger left hand   implantable loop recorder placement  07/02/2019   Medtronic Reveal [place] model LNQ11 (SN [id number]) implanted in office by Dr Rayann Heman   TEE WITHOUT CARDIOVERSION N/A 03/30/2018   Procedure: TRANSESOPHAGEAL ECHOCARDIOGRAM (TEE);  Surgeon: [name], MD;  Location: [facility];  Service: Cardiovascular;  Laterality: N/A;   TEE WITHOUT CARDIOVERSION N/A 03/23/2021   Procedure: TRANSESOPHAGEAL ECHOCARDIOGRAM (TEE);  Surgeon: Acie Fredrickson Wonda Cheng, MD;  Location: [facility];  Service: Cardiovascular;  Laterality: N/A;  TOTAL HIP ARTHROPLASTY Left    TOTAL HIP ARTHROPLASTY Right 09/12/2018   Procedure: TOTAL HIP ARTHROPLASTY ANTERIOR APPROACH;  Surgeon: [name], MD;  Location: [facility];  Service: Orthopedics;  Laterality: Right;  70 mins    reports that he quit smoking about 44 years ago. His smoking use included cigarettes. He has a 7.00 pack-year smoking history. He has never used smokeless tobacco. He reports current alcohol use of about 3.0 - 4.0 standard drinks of alcohol per week. He reports that he does not use drugs. family history includes Cancer in his father; Colon cancer  in his mother; Heart disease in his paternal grandfather and paternal grandmother. Allergies  Allergen Reactions   Amoxicillin Rash and Other (See Comments)    RASH IN GROIN AREA Has patient had a PCN reaction causing immediate rash, facial/tongue/throat swelling, SOB or lightheadedness with hypotension: no Has patient had a PCN reaction causing severe rash involving mucus membranes or skin necrosis: no Has patient had a PCN reaction that required hospitalization: no Has patient had a PCN reaction occurring within the last 10 years: yes If all of the above answers are "NO", then may proceed with Cephalosporin use.    Augmentin [Amoxicillin-Pot Clavulanate] Rash and Other (See Comments)    RASH IN GROIN AREA   Azithromycin Rash and Other (See Comments)    RASH IN GROIN AREA   Clindamycin/Lincomycin Rash   Keflex [Cephalexin] Rash   Xarelto [Rivaroxaban] Other (See Comments)    Patient stated he "ended up in the hospital with a rectal bleed"    Review of Systems  Constitutional:  Negative for chills and fever.  Respiratory:  Negative for shortness of breath.   Cardiovascular:  Negative for chest pain.  Gastrointestinal:  Negative for abdominal pain.      Objective:     BP 128/68 (BP Location: Left Arm, Patient Position: Sitting, Cuff Size: Large)   Pulse 62   Temp (!) 97.5 F (36.4 C) (Oral)   Ht '5\' 10"'$  (1.778 m)   Wt 217 lb (98.4 kg)   SpO2 99%   BMI 31.14 kg/m  {Vitals History (Optional):23777}  Physical Exam Vitals reviewed.  Constitutional:      Appearance: Normal appearance.  Cardiovascular:     Rate and Rhythm: Normal rate and regular rhythm.  Pulmonary:     Effort: Pulmonary effort is normal.     Breath sounds: Normal breath sounds.  Neurological:     Mental Status: He is alert.      No results found for any visits on 09/03/22.  {Labs (Optional):23779}  The ASCVD Risk score (Arnett DK, et al., 2019) failed to calculate for the following reasons:    The valid total cholesterol range is 130 to 320 mg/dL    Assessment & Plan:   #1 ongoing unilateral rhinorrhea right naris.  Recent beta-2 transferrin assay negative.  Pending referral to ENT.  #2 type 2 diabetes well controlled A1c 5.5%.  Continue slow tapering and Toujeo dose if blood sugars remain stable recheck A1c at 56-monthfollow-up   Return in about 3 months (around 12/03/2022).    BCarolann Littler MD

## 2022-09-03 NOTE — Patient Instructions (Signed)
[facility] [phone]

## 2022-09-06 ENCOUNTER — Ambulatory Visit (INDEPENDENT_AMBULATORY_CARE_PROVIDER_SITE_OTHER): Payer: Medicare Other

## 2022-09-06 DIAGNOSIS — I480 Paroxysmal atrial fibrillation: Secondary | ICD-10-CM

## 2022-09-06 LAB — CUP PACEART REMOTE DEVICE CHECK
Date Time Interrogation Session: 20231203231908
Implantable Pulse Generator Implant Date: 20200928

## 2022-09-10 ENCOUNTER — Encounter: Payer: Self-pay | Admitting: Family Medicine

## 2022-09-10 NOTE — Telephone Encounter (Signed)
Thanks!.   Noted  [name] MD [facility] at [facility]

## 2022-09-13 DIAGNOSIS — E119 Type 2 diabetes mellitus without complications: Secondary | ICD-10-CM | POA: Diagnosis not present

## 2022-09-20 ENCOUNTER — Other Ambulatory Visit (HOSPITAL_COMMUNITY): Payer: Self-pay | Admitting: *Deleted

## 2022-09-20 MED ORDER — AMIODARONE HCL 200 MG PO TABS: ORAL_TABLET | ORAL | 1 refills | Status: DC

## 2022-09-22 ENCOUNTER — Other Ambulatory Visit: Payer: Self-pay | Admitting: Family Medicine

## 2022-10-11 ENCOUNTER — Ambulatory Visit (INDEPENDENT_AMBULATORY_CARE_PROVIDER_SITE_OTHER): Payer: BLUE CROSS/BLUE SHIELD

## 2022-10-11 DIAGNOSIS — J3489 Other specified disorders of nose and nasal sinuses: Secondary | ICD-10-CM | POA: Diagnosis not present

## 2022-10-11 DIAGNOSIS — J342 Deviated nasal septum: Secondary | ICD-10-CM | POA: Diagnosis not present

## 2022-10-11 DIAGNOSIS — J329 Chronic sinusitis, unspecified: Secondary | ICD-10-CM | POA: Diagnosis not present

## 2022-10-11 DIAGNOSIS — I480 Paroxysmal atrial fibrillation: Secondary | ICD-10-CM

## 2022-10-11 LAB — CUP PACEART REMOTE DEVICE CHECK
Date Time Interrogation Session: 20240107231155
Implantable Pulse Generator Implant Date: 20200928

## 2022-10-13 DIAGNOSIS — E119 Type 2 diabetes mellitus without complications: Secondary | ICD-10-CM | POA: Diagnosis not present

## 2022-10-13 NOTE — Progress Notes (Signed)
[facility] Summary Report / Loop Recorder 

## 2022-10-19 DIAGNOSIS — J329 Chronic sinusitis, unspecified: Secondary | ICD-10-CM | POA: Diagnosis not present

## 2022-10-19 DIAGNOSIS — J3489 Other specified disorders of nose and nasal sinuses: Secondary | ICD-10-CM | POA: Diagnosis not present

## 2022-10-22 ENCOUNTER — Ambulatory Visit: Payer: Medicare Other | Admitting: Family Medicine

## 2022-10-26 ENCOUNTER — Ambulatory Visit (INDEPENDENT_AMBULATORY_CARE_PROVIDER_SITE_OTHER): Payer: Medicare Other | Admitting: Family Medicine

## 2022-10-26 ENCOUNTER — Encounter: Payer: Self-pay | Admitting: Family Medicine

## 2022-10-26 VITALS — BP 126/72 | HR 72 | Temp 96.3°F | Ht 70.0 in | Wt 203.4 lb

## 2022-10-26 DIAGNOSIS — E1142 Type 2 diabetes mellitus with diabetic polyneuropathy: Secondary | ICD-10-CM

## 2022-10-26 DIAGNOSIS — I10 Essential (primary) hypertension: Secondary | ICD-10-CM

## 2022-10-26 DIAGNOSIS — F4310 Post-traumatic stress disorder, unspecified: Secondary | ICD-10-CM

## 2022-10-26 DIAGNOSIS — E512 Wernicke's encephalopathy: Secondary | ICD-10-CM | POA: Diagnosis not present

## 2022-10-26 LAB — POCT GLYCOSYLATED HEMOGLOBIN (HGB A1C): Hemoglobin A1C: 5.5 % (ref 4.0–5.6)

## 2022-10-26 MED ORDER — DULOXETINE HCL 60 MG PO CPEP: ORAL_CAPSULE | ORAL | 3 refills | Status: DC

## 2022-10-26 MED ORDER — LISINOPRIL 10 MG PO TABS: 10.0000 mg | ORAL_TABLET | Freq: Every day | ORAL | 3 refills | Status: DC

## 2022-10-26 NOTE — Progress Notes (Signed)
Established Patient Office Visit  Subjective   Patient ID: Adam [name], male    DOB: 01/27/1951  Age: 72 y.o. MRN: [id number]  Chief Complaint  Patient presents with   Follow-up    HPI   Adam [name] is here with wife for medical follow-up.  He has history of CAD, hypertension, atrial fibrillation, ongoing alcohol abuse, COPD, hyperlipidemia, Wernicke encephalopathy with chronic speech impediment.  Doing reasonably well.  He states he has not been taking his Toujeo insulin for at least the past month.  His A1c today is surprisingly well-controlled at 5.5%.  His wife also states he has poor compliance with eating lots of "sweets ".  He has had some chronic rhinorrhea from the right nostril.  He had beta-2 transferrin assessment here through our lab which came back negative.  He has seen ENT since then had some type of scan which did not show any obvious CNS leak.  He has some type of lab pending.  Needs refills of lisinopril.  He apparently stopped taking Vistaril consistently recently.  Last echo showed EF 45 to 50%.  He has history of PTSD.  He has been on duloxetine for many years and needing refills.  Symptoms stable.  Past Medical History:  Diagnosis Date   Alcohol withdrawal ([place]) 10/24/2019   Allergy    Anxiety    history of PTSD following CABG   Ascending aortic aneurysm ([place]) 01/31/2018   43 x 42 mm, pt unaware   Asthma    Cardiomegaly 10/17/2017   Colitis- colonoscopy 2014 07/13/2015   COPD (chronic obstructive pulmonary disease) ([place])    Coronary artery disease    x 6   Depression    Diabetes mellitus without complication ([place])    Family history of polyps in the colon    Finger dislocation    Left pinkie   GERD (gastroesophageal reflux disease)    Gout    H/O atrial fibrillation without current medication    following CABG with no documented episodes since then.   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   Past Surgical History:  Procedure Laterality Date   ANKLE FRACTURE SURGERY Right 1991   APPENDECTOMY     ATRIAL FIBRILLATION ABLATION N/A 03/31/2018   Procedure: ATRIAL FIBRILLATION ABLATION;  Surgeon: [name], MD;  Location: [facility];  Service: Cardiovascular;  Laterality: N/A;   CARDIAC ELECTROPHYSIOLOGY MAPPING AND ABLATION     CARDIOVERSION N/A 03/23/2021   Procedure: CARDIOVERSION;  Surgeon: Acie Fredrickson Wonda Cheng, MD;  Location: [facility];  Service: Cardiovascular;  Laterality: N/A;   COLONOSCOPY  06/11/2021   COLONOSCOPY W/ BIOPSIES  2017   x7   CORONARY ANGIOPLASTY WITH STENT PLACEMENT     CORONARY ARTERY BYPASS GRAFT     FINGER SURGERY  04/2018   Small finger left hand   implantable loop recorder placement  07/02/2019   Medtronic Reveal [place] model LNQ11 (SN [id number]) implanted in office by Dr Rayann Heman   TEE WITHOUT CARDIOVERSION N/A 03/30/2018   Procedure: TRANSESOPHAGEAL ECHOCARDIOGRAM (TEE);  Surgeon: [name], MD;  [facility];  Service: Cardiovascular;  Laterality: N/A;   TEE  WITHOUT CARDIOVERSION N/A 03/23/2021   Procedure: TRANSESOPHAGEAL ECHOCARDIOGRAM (TEE);  Surgeon: Acie Fredrickson Wonda Cheng, MD;  Location: [facility];  Service: Cardiovascular;  Laterality: N/A;   TOTAL HIP ARTHROPLASTY Left    TOTAL HIP ARTHROPLASTY Right 09/12/2018   Procedure: TOTAL HIP ARTHROPLASTY ANTERIOR APPROACH;  Surgeon: [name], MD;  Location: [facility];  Service: Orthopedics;  Laterality: Right;  70 mins    reports that he quit smoking about 45 years ago. His smoking use included cigarettes. He has a 7.00 pack-year smoking history. He has never used smokeless tobacco. He reports current alcohol use of about 3.0 - 4.0 standard  drinks of alcohol per week. He reports that he does not use drugs. family history includes Cancer in his father; Colon cancer in his mother; Heart disease in his paternal grandfather and paternal grandmother. Allergies  Allergen Reactions   Amoxicillin Rash and Other (See Comments)    RASH IN GROIN AREA Has patient had a PCN reaction causing immediate rash, facial/tongue/throat swelling, SOB or lightheadedness with hypotension: no Has patient had a PCN reaction causing severe rash involving mucus membranes or skin necrosis: no Has patient had a PCN reaction that required hospitalization: no Has patient had a PCN reaction occurring within the last 10 years: yes If all of the above answers are "NO", then may proceed with Cephalosporin use.    Augmentin [Amoxicillin-Pot Clavulanate] Rash and Other (See Comments)    RASH IN GROIN AREA   Azithromycin Rash and Other (See Comments)    RASH IN GROIN AREA   Clindamycin/Lincomycin Rash   Keflex [Cephalexin] Rash   Xarelto [Rivaroxaban] Other (See Comments)    Patient stated he "ended up in the hospital with a rectal bleed"    Review of Systems  Constitutional:  Negative for malaise/fatigue.  Eyes:  Negative for blurred vision.  Respiratory:  Negative for shortness of breath.   Cardiovascular:  Negative for chest pain.  Neurological:  Negative for dizziness, weakness and headaches.      Objective:     BP 126/72 (BP Location: Left Arm, Patient Position: Sitting, Cuff Size: Normal)   Pulse 72   Temp (!) 96.3 F (35.7 C) (Axillary)   Ht '5\' 10"'$  (1.778 m)   Wt 203 lb 6.4 oz (92.3 kg)   SpO2 99%   BMI 29.18 kg/m    Physical Exam Vitals reviewed.  Constitutional:      Appearance: He is well-developed.  HENT:     Right Ear: External ear normal.     Left Ear: External ear normal.  Eyes:     Pupils: Pupils are equal, round, and reactive to light.  Neck:     Thyroid: No thyromegaly.  Cardiovascular:     Rate and Rhythm: Normal rate  and regular rhythm.  Pulmonary:     Effort: Pulmonary effort is normal. No respiratory distress.     Breath sounds: Normal breath sounds. No wheezing or rales.  Musculoskeletal:     Cervical back: Neck supple.  Neurological:     Mental Status: He is alert and oriented to person, place, and time.      Results for orders placed or performed in visit on 10/26/22  POCT glycosylated hemoglobin (Hb A1C)  Result Value Ref Range   Hemoglobin A1C 5.5 4.0 - 5.6 %   HbA1c POC (<> result, manual entry)     HbA1c, POC (prediabetic range)     HbA1c, POC (controlled diabetic range)        The ASCVD  Risk score (Arnett DK, et al., 2019) failed to calculate for the following reasons:   The valid total cholesterol range is 130 to 320 mg/dL    Assessment & Plan:   #1 type 2 diabetes well-controlled with A1c of 5.5%.  This is especially surprising since he states he has not taken Toujeo for the past month.  Hold insulin for now.  Recheck in 3 months and reassess A1c at that time.  #2 hypertension currently stable and at goal.  Refill lisinopril for 1 year  #3 history of PTSD.  Stable overall.  Refill Cymbalta 60 mg once daily.  #4 longstanding history of alcohol abuse.  Low motivation to quit.  He had unfortunate complication neurologically of Wernicke encephalopathy with some chronic associated dysarthria   Carolann Littler, MD

## 2022-11-05 ENCOUNTER — Encounter (HOSPITAL_BASED_OUTPATIENT_CLINIC_OR_DEPARTMENT_OTHER): Payer: Self-pay | Admitting: Emergency Medicine

## 2022-11-05 ENCOUNTER — Emergency Department (HOSPITAL_BASED_OUTPATIENT_CLINIC_OR_DEPARTMENT_OTHER)
Admission: EM | Admit: 2022-11-05 | Discharge: 2022-11-06 | Disposition: A | Discharge status: Home / Self Care | Payer: Medicare Other | Attending: Emergency Medicine | Admitting: Emergency Medicine

## 2022-11-05 ENCOUNTER — Other Ambulatory Visit: Payer: Self-pay

## 2022-11-05 ENCOUNTER — Other Ambulatory Visit: Payer: Self-pay | Admitting: Family Medicine

## 2022-11-05 DIAGNOSIS — J449 Chronic obstructive pulmonary disease, unspecified: Secondary | ICD-10-CM | POA: Diagnosis not present

## 2022-11-05 DIAGNOSIS — K068 Other specified disorders of gingiva and edentulous alveolar ridge: Secondary | ICD-10-CM | POA: Diagnosis not present

## 2022-11-05 DIAGNOSIS — I2510 Atherosclerotic heart disease of native coronary artery without angina pectoris: Secondary | ICD-10-CM | POA: Diagnosis not present

## 2022-11-05 DIAGNOSIS — I10 Essential (primary) hypertension: Secondary | ICD-10-CM | POA: Diagnosis not present

## 2022-11-05 DIAGNOSIS — Z87891 Personal history of nicotine dependence: Secondary | ICD-10-CM | POA: Diagnosis not present

## 2022-11-05 DIAGNOSIS — J45909 Unspecified asthma, uncomplicated: Secondary | ICD-10-CM | POA: Insufficient documentation

## 2022-11-05 DIAGNOSIS — Z951 Presence of aortocoronary bypass graft: Secondary | ICD-10-CM | POA: Insufficient documentation

## 2022-11-05 DIAGNOSIS — Z794 Long term (current) use of insulin: Secondary | ICD-10-CM | POA: Insufficient documentation

## 2022-11-05 DIAGNOSIS — E119 Type 2 diabetes mellitus without complications: Secondary | ICD-10-CM | POA: Insufficient documentation

## 2022-11-05 DIAGNOSIS — Z7901 Long term (current) use of anticoagulants: Secondary | ICD-10-CM | POA: Diagnosis not present

## 2022-11-05 DIAGNOSIS — Z85828 Personal history of other malignant neoplasm of skin: Secondary | ICD-10-CM | POA: Diagnosis not present

## 2022-11-05 DIAGNOSIS — Z79899 Other long term (current) drug therapy: Secondary | ICD-10-CM | POA: Diagnosis not present

## 2022-11-05 DIAGNOSIS — E1142 Type 2 diabetes mellitus with diabetic polyneuropathy: Secondary | ICD-10-CM

## 2022-11-05 LAB — CBC WITH DIFFERENTIAL/PLATELET
Abs Immature Granulocytes: 0.04 10*3/uL (ref 0.00–0.07)
Basophils Absolute: 0 10*3/uL (ref 0.0–0.1)
Basophils Relative: 0 %
Eosinophils Absolute: 0 10*3/uL (ref 0.0–0.5)
Eosinophils Relative: 0 %
HCT: 36.4 % — ABNORMAL LOW (ref 39.0–52.0)
Hemoglobin: 13 g/dL (ref 13.0–17.0)
Immature Granulocytes: 0 %
Lymphocytes Relative: 7 %
Lymphs Abs: 0.7 10*3/uL (ref 0.7–4.0)
MCH: 34.3 pg — ABNORMAL HIGH (ref 26.0–34.0)
MCHC: 35.7 g/dL (ref 30.0–36.0)
MCV: 96 fL (ref 80.0–100.0)
Monocytes Absolute: 0.5 10*3/uL (ref 0.1–1.0)
Monocytes Relative: 5 %
Neutro Abs: 8.8 10*3/uL — ABNORMAL HIGH (ref 1.7–7.7)
Neutrophils Relative %: 88 %
Platelets: 251 10*3/uL (ref 150–400)
RBC: 3.79 MIL/uL — ABNORMAL LOW (ref 4.22–5.81)
RDW: 13 % (ref 11.5–15.5)
WBC: 10.1 10*3/uL (ref 4.0–10.5)
nRBC: 0 % (ref 0.0–0.2)

## 2022-11-05 MED ORDER — TRANEXAMIC ACID 1000 MG/10ML IV SOLN
500.0000 mg | Freq: Once | INTRAVENOUS | Status: AC
  Administered 2022-11-05: 500 mg via TOPICAL
  Filled 2022-11-05: qty 10

## 2022-11-05 MED ORDER — TRANEXAMIC ACID FOR INHALATION
500.0000 mg | Freq: Once | RESPIRATORY_TRACT | Status: AC
  Administered 2022-11-05: 500 mg via RESPIRATORY_TRACT
  Filled 2022-11-05: qty 10

## 2022-11-05 NOTE — Progress Notes (Unsigned)
Care Management & Coordination Services Pharmacy Note  11/05/2022 Name:  Adam [name] MRN:  [id number] DOB:  [birth year]  Summary: ***  Recommendations/Changes made from today's visit: ***  Follow up plan: ***   Subjective: Adam [name] is an 72 y.o. year old male who is a primary patient of [name], Adam Sierras, MD.  The care coordination team was consulted for assistance with disease management and care coordination needs.    {CCMTELEPHONEFACETOFACE:21091510} for follow up visit.  Recent office visits: 10/26/22 Adam Post, MD - General F/U - STOP Toujeo as pt reported not using the last month  09/03/22 [name], Adam Sierras, MD -General F/U - No medication changes, no new health concerns.  07/23/22 [name], Adam Sierras, MD - Patient presented for controlled type 2 diabetes mellitus with diabetic polyneuropathy without long term current use of insulin and other concerns. Advised to gradually decrease Toujeo.   Recent consult visits: 10/11/22 Adam [name] (ENT) - Determined no CFS leak, recommend atrovent nasal spray for diagnosed rhinorrhea  06/17/22 Patient presented to Adam [name] for CT Angio Chest Aorta W/CM & OR.    06/11/22 Adam [name], Adam M, MD (Cardiology) - Patient presented for Aneurysm of ascending aorta without rupture and other concerns.  Stopped Breztri.   06/03/22 [name], MD (Pulmonary Disease) - Patient presented for COPD with asthma. Noted to hold of in BD therapy for now. Advised to use albuterol as needed.  Hospital visits: {Hospital DC Yes/No:25215}   Objective:  Lab Results  Component Value Date   CREATININE 0.79 06/11/2022   BUN 13 06/11/2022   GFR 70.80 08/05/2021   EGFR 95 06/11/2022   GFRNONAA >60 01/27/2022   GFRAA 115 08/22/2020   NA 143 06/11/2022   K 3.7 06/11/2022   CALCIUM 10.0 06/11/2022   CO2 24 06/11/2022   GLUCOSE 122 (H) 06/11/2022    Lab Results  Component Value Date/Time   HGBA1C 5.5  10/26/2022 01:42 PM   HGBA1C 5.5 07/23/2022 12:00 PM   HGBA1C 5.7 (H) 03/20/2021 04:50 PM   HGBA1C 6.4 12/22/2020 11:16 AM   HGBA1C 10.1 (H) 09/23/2020 12:16 PM   GFR 70.80 08/05/2021 03:57 PM   GFR 75.74 12/03/2019 03:02 PM   MICROALBUR 48.6 (H) 07/30/2022 11:15 AM   MICROALBUR 2.6 (H) 06/22/2016 11:05 AM    Last diabetic Eye exam:  Lab Results  Component Value Date/Time   HMDIABEYEEXA No Retinopathy 08/31/2022 12:00 AM    Last diabetic Foot exam: No results found for: "HMDIABFOOTEX"   Lab Results  Component Value Date   CHOL 119 06/11/2022   HDL 37 (L) 06/11/2022   LDLCALC 51 06/11/2022   LDLDIRECT 124.0 06/23/2018   TRIG 186 (H) 06/11/2022   CHOLHDL 3.2 06/11/2022       Latest Ref Rng & Units 06/11/2022    9:20 AM 01/27/2022   12:16 PM 07/30/2021    4:42 PM  Hepatic Function  Total Protein 6.0 - 8.5 g/dL 7.1  6.7  6.9   Albumin 3.8 - 4.8 g/dL 4.4  3.5  3.9   AST 0 - 40 IU/L 22  39  28   ALT 0 - 44 IU/L 18  32  16   Alk Phosphatase 44 - 121 IU/L 81  48  48   Total Bilirubin 0.0 - 1.2 mg/dL 0.7  1.0  0.9     Lab Results  Component Value Date/Time   TSH 4.170 06/11/2022 09:20 AM   TSH 1.652 01/27/2022 12:17  PM   TSH 1.984 07/30/2021 01:40 PM   TSH 1.32 12/29/2018 11:01 AM       Latest Ref Rng & Units 08/05/2021    3:57 PM 08/01/2021    9:09 AM 07/30/2021    4:42 PM  CBC  WBC 4.0 - 10.5 K/uL 6.8  5.7  6.5   Hemoglobin 13.0 - 17.0 g/dL 13.7  11.9  13.0   Hematocrit 39.0 - 52.0 % 40.6  34.4  37.3   Platelets 150.0 - 400.0 K/uL 283.0  187  218     Lab Results  Component Value Date/Time   VITAMINB12 353 02/09/2020 11:17 AM   VITAMINB12 172 (L) 12/29/2018 11:01 AM    Clinical ASCVD: {YES/NO:21197} The ASCVD Risk score (Arnett DK, et al., 2019) failed to calculate for the following reasons:   The valid total cholesterol range is 130 to 320 mg/dL    ***Other: (CHADS2VASc if Afib, MMRC or CAT for COPD, ACT, DEXA)     09/03/2022   11:29 AM 07/23/2022    12:01 PM 12/30/2021    4:00 PM  Depression screen PHQ 2/9  Decreased Interest 3 3 0  Down, Depressed, Hopeless 3 2 0  PHQ - 2 Score 6 5 0  Altered sleeping 3 3   Tired, decreased energy 3 3   Change in appetite 3 3   Feeling bad or failure about yourself  3 2   Trouble concentrating 3 2   Moving slowly or fidgety/restless  1   Suicidal thoughts  1   PHQ-9 Score 21 20   Difficult doing work/chores  Not difficult at all      Social History   Tobacco Use  Smoking Status Former   Packs/day: 1.00   Years: 7.00   Total pack years: 7.00   Types: Cigarettes   Quit date: 10/04/1977   Years since quitting: 45.1  Smokeless Tobacco Never  Tobacco Comments   never smoked over 1 pack    BP Readings from Last 3 Encounters:  10/26/22 126/72  09/03/22 128/68  07/29/22 (!) 146/84   Pulse Readings from Last 3 Encounters:  10/26/22 72  09/03/22 62  07/29/22 67   Wt Readings from Last 3 Encounters:  10/26/22 203 lb 6.4 oz (92.3 kg)  09/03/22 217 lb (98.4 kg)  07/29/22 220 lb 3.2 oz (99.9 kg)   BMI Readings from Last 3 Encounters:  10/26/22 29.18 kg/[name]  09/03/22 31.14 kg/[name]  07/29/22 31.60 kg/[name]    Allergies  Allergen Reactions   Amoxicillin Rash and Other (See Comments)    RASH IN GROIN AREA Has patient had a PCN reaction causing immediate rash, facial/tongue/throat swelling, SOB or lightheadedness with hypotension: no Has patient had a PCN reaction causing severe rash involving mucus membranes or skin necrosis: no Has patient had a PCN reaction that required hospitalization: no Has patient had a PCN reaction occurring within the last 10 years: yes If all of the above answers are "NO", then may proceed with Cephalosporin use.    Augmentin [Amoxicillin-Pot Clavulanate] Rash and Other (See Comments)    RASH IN GROIN AREA   Azithromycin Rash and Other (See Comments)    RASH IN GROIN AREA   Clindamycin/Lincomycin Rash   Keflex [Cephalexin] Rash   Xarelto [Rivaroxaban] Other  (See Comments)    Patient stated he "ended up in the hospital with a rectal bleed"    Medications Reviewed Today     Reviewed by Adam Post, MD (Physician) on 10/26/22 at  1357  Med List Status: <None>   Medication Order Taking? Sig Documenting Provider Last Dose Status Informant  allopurinol (ZYLOPRIM) 300 MG tablet [id number] Yes TAKE 1 TABLET(300 MG) BY MOUTH DAILY [name], Adam Sierras, MD Taking Active   amiodarone (PACERONE) 200 MG tablet [id number] Yes TAKE 1 TABLET(200 MG) BY MOUTH DAILY [name], NP Taking Active   apixaban (ELIQUIS) 5 MG TABS tablet [id number] Yes Take 1 tablet (5 mg total) by mouth 2 (two) times daily. Adam [name], Adam M, MD Taking Active   B COMPLEX-C-FOLIC ACID PO [id number] Yes Take 1 tablet by mouth daily. [provider] Taking Active Self  Continuous Blood Gluc Sensor ([place]) [place] [id number] Yes by Does not apply route. Every 10 days [provider] Taking Active   DULoxetine (CYMBALTA) 60 MG capsule [id number] Yes TAKE 1 CAPSULE(60 MG) BY MOUTH DAILY [name], Adam Sierras, MD Taking Active   Empagliflozin-metFORMIN HCl ER (SYNJARDY XR) 25-1000 MG TB24 [id number] Yes Take 1 tablet by mouth daily. Adam Post, MD Taking Active   gabapentin (NEURONTIN) 300 MG capsule [id number] Yes TAKE 2 CAPSULES BY MOUTH AT NIGHT AS NEEDED FOR RESTLESS LEGS OR SYMPTOMS Adam Post, MD Taking Active   HM LIDOCAINE [facility] EX [id number] Yes Apply 1 patch topically as needed (pain). [provider] Taking Active Self  Insulin Pen Needle (NOVOFINE PEN NEEDLE) 32G X 6 MM MISC [id number] Yes Use 1-4 times daily as needed for insulin [name], Adam Sierras, MD Taking Active Self  lisinopril (ZESTRIL) 10 MG tablet [id number] Yes Take 1 tablet (10 mg total) by mouth daily. Adam Post, MD Taking Active   Magnesium Oxide 400 MG CAPS [id number] Yes Take 1 capsule (400 mg total) by mouth daily. Adam Post, MD Taking Active   metoprolol  tartrate (LOPRESSOR) 50 MG tablet [id number] Yes TAKE 1 TABLET(50 MG) BY MOUTH TWICE DAILY [name], NP Taking Active   montelukast (SINGULAIR) 10 MG tablet [id number] Yes Take 1 tablet (10 mg total) by mouth at bedtime. Adam Post, MD Taking Active   rosuvastatin (CRESTOR) 40 MG tablet [id number] Yes TAKE 1 TABLET(40 MG) BY MOUTH DAILY Adam [name], Adam M, MD Taking Active   SKYRIZI PEN 150 MG/ML Darden Adam [id number] Yes Inject into the skin. Takes every 3 months [provider] Taking Active   sodium chloride (OCEAN) 0.65 % SOLN nasal spray [id number] Yes Place 1 spray into both nostrils as needed for congestion. [provider] Taking Active   Spacer/Aero-Holding Dorise Bullion [id number] Yes Use with inhaler Clayton Bibles, NP Taking Active   thiamine (VITAMIN B-1) 100 MG tablet [id number] Yes Take 500 mg by mouth daily. [provider] Taking Active Self  TOUJEO SOLOSTAR 300 UNIT/ML Solostar Pen [id number] Yes INJECT 60 UNITS INTO THE SKIN EVERY DAY  Patient taking differently: Inject 52 Units into the skin daily.   [name], Adam Sierras, MD Taking Active             SDOH:  (Social Determinants of Health) assessments and interventions performed: {yes/no:20286} SDOH Interventions    Flowsheet Row Telephone from 08/30/2022 in [place] Coordination Office Visit from 07/23/2022 in [place] at [place] from 12/30/2021 in [place] at [place] [facility] from 12/07/2021 in [place] at [place] from 12/24/2020 in [place] at [place] Interventions Intervention Not Indicated --  Intervention Not Indicated -- --  Housing Interventions Intervention Not Indicated -- Intervention Not Indicated -- Intervention Not Indicated  Transportation Interventions Intervention Not  Indicated -- Intervention Not Indicated Intervention Not Indicated Intervention Not Indicated  Utilities Interventions Intervention Not Indicated -- -- -- --  Depression Interventions/Treatment  -- Medication -- -- --  Financial Strain Interventions -- -- Intervention Not Indicated Intervention Not Indicated Intervention Not Indicated  Physical Activity Interventions -- -- Intervention Not Indicated -- Intervention Not Indicated  Stress Interventions -- -- Intervention Not Indicated -- Intervention Not Indicated  Social Connections Interventions -- -- Intervention Not Indicated -- Intervention Not Indicated       Medication Assistance: {MEDASSISTANCEINFO:25044}  Medication Access: Within the past 30 days, how often has patient missed a dose of medication? *** Is a pillbox or other method used to improve adherence? {YES/NO:21197} Factors that may affect medication adherence? {CHL DESC; BARRIERS:21522} Are meds synced by current pharmacy? {YES/NO:21197} Are meds delivered by current pharmacy? {YES/NO:21197} Does patient experience delays in picking up medications due to transportation concerns? {YES/NO:21197}  [facility] Services Reviewed: Is patient disadvantaged to use [facility]?: Yes  Current Rx insurance plan: [facility] Medicare Essential Plus Name and location of Current pharmacy:  [facility] [place] - [address] [place] [address] [place] [address] [place] [address] Phone: [phone] Fax: [phone]  [facility] services reviewed with patient today?: No  Patient requests to transfer care to [facility]?: No  Reason patient declined to change pharmacies: Disadvantaged due to insurance/mail order  Compliance/Adherence/Medication fill history: Care Gaps: AWV (sch 01/03/23) Foot exam Covid/Tdap vaccine  Star-Rating Drugs: Synjardy XR 25-'1000mg'$ : PDC 98% Lisinopril '10mg'$ : PDC 100% Metoprolol Tartrate '50mg'$   PDC 60% Rosuvastatin '40mg'$  PDC 81%    Assessment/Plan   Hyperlipidemia: (LDL goal < 70, TG <150) -Not ideally controlled -Current treatment: Rosuvastatin '40mg'$  daily -Medications previously tried: Atorvastatin, Fenofibrate, Zetia, Simvastatin, Vascepa -Current dietary patterns: *** -Current exercise habits: *** -Educated on {CCM HLD Counseling:25126} -{CCMPHARMDINTERVENTION:25122}  Atrial Fibrillation (Goal: prevent stroke and major bleeding) -{US controlled/uncontrolled:25276} -CHADSVASC: 4 -Current treatment: Rate/Rhythm control:  Amiodarone '200mg'$  1 tab daily Metoprolol Tartrate '50mg'$  BID Anticoagulation: Eliquis '5mg'$  BID -Medications previously tried: Xarelto, Tikosyn, Diltiazem, Atenolol -Home BP and HR readings: ***  -Counseled on {CCMAFIBCOUNSELING:25120} -{CCMPHARMDINTERVENTION:25122}  Maren Reamer Clinical Pharmacist [phone]

## 2022-11-05 NOTE — ED Triage Notes (Signed)
Had 3 teeth pulled today at 9 AM Bleeding since procedure On eliquis. Took last dose 11/04/22 in AM

## 2022-11-05 NOTE — ED Notes (Signed)
RT administered TXA nebulizer per order for post dental surgery bleeding.

## 2022-11-06 DIAGNOSIS — K068 Other specified disorders of gingiva and edentulous alveolar ridge: Secondary | ICD-10-CM | POA: Diagnosis not present

## 2022-11-06 NOTE — ED Provider Notes (Signed)
Care of the patient assumed at the change of shift. Here with bleeding from recent dental extraction. On Eliquis but has not taken in 2 days. He has had multiple attempts at hemostasis with TXA and combat gauze, but still having some oozing. He has not been compliant with keeping his gauze in place for longer than a few minutes at a time. He has indicated he wants to go home to call his dentist in the morning. Wife at bedside is apprehensive but ultimately is willing to take him home. Given additional supplies. Recommended he leave the gauze in place for at least an hour without taking it out. RTED if his symptoms worsen or if he has any signs of symptomatic blood loss. Currently his Hgb is normal and he hemodynamically stable.    Truddie Hidden, MD 11/06/22 [phone]

## 2022-11-06 NOTE — Discharge Instructions (Signed)
Please try to leave the gauze in place for at least an hour or more to help slow your bleeding. Call your Dentist in the morning to arrange follow up. If bleeding worsens please return to the ER for re-examination,

## 2022-11-06 NOTE — ED Provider Notes (Signed)
[facility] Provider Note  CSN: [id number] Arrival date & time: 11/05/22 2004  Chief Complaint(s) Dental Problem  HPI Adam [name] is a 72 y.o. male with history of COPD, coronary artery disease, diabetes, atrial fibrillation on Eliquis presenting to the emergency department for gum bleeding.  Patient had 3 teeth pulled today.  Has been having bleeding since.  Had not taken Eliquis today, last took it yesterday morning.  No lightheadedness, dizziness, syncope.  No other bleeding.  Symptoms constant.   Past Medical History Past Medical History:  Diagnosis Date   Alcohol withdrawal ([place]) 10/24/2019   Allergy    Anxiety    history of PTSD following CABG   Ascending aortic aneurysm ([place]) 01/31/2018   43 x 42 mm, pt unaware   Asthma    Cardiomegaly 10/17/2017   Colitis- colonoscopy 2014 August 10, 2015   COPD (chronic obstructive pulmonary disease) ([place])    Coronary artery disease    x 6   Depression    Diabetes mellitus without complication ([place])    Family history of polyps in the colon    Finger dislocation    Left pinkie   GERD (gastroesophageal reflux disease)    Gout    H/O atrial fibrillation without current medication    following CABG with no documented episodes since then.   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   Patient Active Problem List   Diagnosis Date Noted   COPD with asthma 03/02/2022   Wernicke encephalopathy 10/14/2021   Psoriasis 10/14/2021   Hypokalemia 07/30/2021   Hypertension 06/05/2020   Pressure injury of skin 02/21/2020   Rapid atrial fibrillation ([place]) 02/20/2020   High anion gap metabolic acidosis  57/97/2820   Secondary hypercoagulable state ([place]) 11/02/2019   Paroxysmal atrial fibrillation ([place]) 10/24/2019   Degeneration of lumbar intervertebral disc 09/27/2019   Thoracic aortic aneurysm ([place]) 02/08/2019   Hemoptysis 10/06/2018   S/P right THA, 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   S/P hip replacement 09/12/2018   Low back pain 05/19/2018   Sleep apnea 01/09/2018   Persistent atrial fibrillation 12/12/2017   Allergic rhinitis 08/22/2015   Family history of colon cancer in mother deceased age 29 08-10-2015   Type 2 diabetes mellitus, controlled ([place]) 07/17/2013   COPD with acute exacerbation ([place]) 07/05/2013   CAD (coronary artery disease) 06/26/2013   Hypertension associated with diabetes ([place]) 06/26/2013   Hyperlipidemia associated with type 2 diabetes mellitus ([place]) 06/26/2013   GERD (gastroesophageal reflux disease) 06/26/2013   History of atrial fibrillation without current medication 06/26/2013   Gout 06/26/2013   Osteoarthritis 06/26/2013   Asthma, mild persistent 06/26/2013   Alcohol abuse 06/26/2013   PTSD (post-traumatic stress disorder) 06/26/2013   HLA B27 (HLA B27 positive) 06/26/2013   Obesity (BMI 30-39.9) 06/26/2013   Benign essential hypertension 06/26/2013   Home Medication(s) Prior to Admission medications   Medication Sig Start Date End Date Taking? Authorizing Provider  allopurinol (ZYLOPRIM) 300 MG tablet TAKE 1 TABLET(300 MG) BY MOUTH DAILY 09/01/22   [name], Alinda Sierras, MD  amiodarone (PACERONE) 200 MG tablet TAKE 1 TABLET(200 MG) BY MOUTH DAILY 09/20/22   Roderic Palau  C, NP  apixaban (ELIQUIS) 5 MG TABS tablet Take 1 tablet (5 mg total) by mouth 2 (two) times daily. 06/11/22   Martinique, [name], MD  B COMPLEX-C-FOLIC ACID PO Take 1 tablet by mouth daily.    [provider]  Continuous Blood Gluc Sensor ([place]) MISC by Does not apply route. Every 10 days    [provider]  DULoxetine (CYMBALTA) 60 MG capsule Take one capsule by mouth once  daily 10/26/22   [name], Alinda Sierras, MD  gabapentin (NEURONTIN) 300 MG capsule TAKE 2 CAPSULES BY MOUTH AT NIGHT AS NEEDED FOR RESTLESS LEGS OR SYMPTOMS 09/22/22   [name], Alinda Sierras, MD  HM LIDOCAINE PATCH EX Apply 1 patch topically as needed (pain).    [provider]  Insulin Pen Needle (NOVOFINE PEN NEEDLE) 32G X 6 MM MISC Use 1-4 times daily as needed for insulin 09/15/20   [name], Alinda Sierras, MD  lisinopril (ZESTRIL) 10 MG tablet Take 1 tablet (10 mg total) by mouth daily. 10/26/22   [name], Alinda Sierras, MD  Magnesium Oxide 400 MG CAPS Take 1 capsule (400 mg total) by mouth daily. 09/15/20   [name], Alinda Sierras, MD  metoprolol tartrate (LOPRESSOR) 50 MG tablet TAKE 1 TABLET(50 MG) BY MOUTH TWICE DAILY 05/26/22   [name], NP  montelukast (SINGULAIR) 10 MG tablet Take 1 tablet (10 mg total) by mouth at bedtime. 05/26/22   [name], Alinda Sierras, MD  rosuvastatin (CRESTOR) 40 MG tablet TAKE 1 TABLET(40 MG) BY MOUTH DAILY 06/11/22   [place], [name], MD  SKYRIZI PEN 150 MG/ML SOAJ Inject into the skin. Takes every 3 months 10/20/21   [provider]  sodium chloride (OCEAN) 0.65 % SOLN nasal spray Place 1 spray into both nostrils as needed for congestion.    [provider]  Spacer/Aero-Holding Chambers DEVI Use with inhaler 01/13/22   Cobb, Karie Schwalbe, NP  SYNJARDY XR 25-1000 MG TB24 TAKE 1 TABLET BY MOUTH DAILY 11/05/22   [name], Alinda Sierras, MD  thiamine (VITAMIN B-1) 100 MG tablet Take 500 mg by mouth daily.    [provider]  TOUJEO SOLOSTAR 300 UNIT/ML Solostar Pen INJECT 60 UNITS INTO THE SKIN EVERY DAY Patient taking differently: Inject 52 Units into the skin daily. 10/28/21   [name], Alinda Sierras, MD                                                                                                                                    Past Surgical History Past Surgical History:  Procedure Laterality Date   ANKLE FRACTURE SURGERY Right 1991   APPENDECTOMY      ATRIAL FIBRILLATION ABLATION N/A 03/31/2018   Procedure: ATRIAL FIBRILLATION ABLATION;  Surgeon: [name], MD;  Location: [facility];  Service: Cardiovascular;  Laterality: N/A;   CARDIAC ELECTROPHYSIOLOGY MAPPING AND ABLATION     CARDIOVERSION N/A 03/23/2021   Procedure:  CARDIOVERSION;  Surgeon: Thayer Headings, MD;  Location: [facility];  Service: Cardiovascular;  Laterality: N/A;   COLONOSCOPY  06/11/2021   COLONOSCOPY W/ BIOPSIES  2017   x7   CORONARY ANGIOPLASTY WITH STENT PLACEMENT     CORONARY ARTERY BYPASS GRAFT     FINGER SURGERY  04/2018   Small finger left hand   implantable loop recorder placement  07/02/2019   Medtronic Reveal Ray model LNQ11 (SN [id number]) implanted in office by Dr Rayann Heman   TEE WITHOUT CARDIOVERSION N/A 03/30/2018   Procedure: TRANSESOPHAGEAL ECHOCARDIOGRAM (TEE);  Surgeon: [name], MD;  Location: [facility];  Service: Cardiovascular;  Laterality: N/A;   TEE WITHOUT CARDIOVERSION N/A 03/23/2021   Procedure: TRANSESOPHAGEAL ECHOCARDIOGRAM (TEE);  Surgeon: Acie Fredrickson Wonda Cheng, MD;  Location[facility];  Service: Cardiovascular;  Laterality: N/A;   TOTAL HIP ARTHROPLASTY Left    TOTAL HIP ARTHROPLASTY Right 09/12/2018   Procedure: TOTAL HIP ARTHROPLASTY ANTERIOR APPROACH;  Surgeon: [name], MD;  Location: [facility];  Service: Orthopedics;  Laterality: Right;  83 mins   Family History Family History  Problem Relation Age of Onset   Colon cancer Mother    Cancer Father        UNKNOWN TYPE   Heart disease Paternal Grandmother    Heart disease Paternal Grandfather    Esophageal cancer Neg Hx    Rectal cancer Neg Hx    Stomach cancer Neg Hx     Social History Social History   Tobacco Use   Smoking status: Former    Packs/day: 1.00    Years: 7.00    Total pack years: 7.00    Types: Cigarettes    Quit date: 10/04/1977    Years since quitting: 45.1   Smokeless tobacco: Never   Tobacco comments:    never smoked over 1  pack   Vaping Use   Vaping Use: Never used  Substance Use Topics   Alcohol use: Yes    Alcohol/week: 3.0 - 4.0 standard drinks of alcohol    Types: 1 Glasses of wine, 2 - 3 Shots of liquor per week    Comment: DAILY   Drug use: No    Comment: Smoked Marijuana back in 1970s   Allergies Amoxicillin, Augmentin [amoxicillin-pot clavulanate], Azithromycin, Clindamycin/lincomycin, Keflex [cephalexin], and Xarelto [rivaroxaban]  Review of Systems Review of Systems  All other systems reviewed and are negative.   Physical Exam Vital Signs  I have reviewed the triage vital signs BP 133/84 (BP Location: Right Arm)   Pulse 96   Temp (!) 97.5 F (36.4 C)   Resp (!) 22   SpO2 98%  Physical Exam Vitals and nursing note reviewed.  Constitutional:      General: He is not in acute distress.    Appearance: Normal appearance.  HENT:     Head:     Comments: Active bleeding from lower incisor, to right upper molars with clot.  Airway patent    Mouth/Throat:     Mouth: Mucous membranes are moist.  Eyes:     Conjunctiva/sclera: Conjunctivae normal.  Cardiovascular:     Rate and Rhythm: Normal rate and regular rhythm.  Pulmonary:     Effort: Pulmonary effort is normal. No respiratory distress.     Breath sounds: Normal breath sounds.  Abdominal:     General: Abdomen is flat.     Palpations: Abdomen is soft.     Tenderness: There is no abdominal tenderness.  Musculoskeletal:     Right lower  leg: No edema.     Left lower leg: No edema.  Skin:    General: Skin is warm and dry.     Capillary Refill: Capillary refill takes less than 2 seconds.  Neurological:     Mental Status: He is alert and oriented to person, place, and time. Mental status is at baseline.  Psychiatric:        Mood and Affect: Mood normal.        Behavior: Behavior normal.     ED Results and Treatments Labs (all labs ordered are listed, but only abnormal results are displayed) Labs Reviewed  CBC WITH  DIFFERENTIAL/PLATELET - Abnormal; Notable for the following components:      Result Value   RBC 3.79 (*)    HCT 36.4 (*)    MCH 34.3 (*)    Neutro Abs 8.8 (*)    All other components within normal limits                                                                                                                          Radiology No results found.  Pertinent labs & imaging results that were available during my care of the patient were reviewed by me and considered in my medical decision making (see MDM for details).  Medications Ordered in ED Medications  tranexamic acid (CYKLOKAPRON) injection 500 mg (500 mg Topical Given 11/05/22 2220)  tranexamic acid (CYKLOKAPRON) 1000 MG/10ML nebulizer solution 500 mg (500 mg Nebulization Given 11/05/22 2258)  tranexamic acid (CYKLOKAPRON) injection 500 mg (500 mg Topical Given 11/05/22 2341)                                                                                                                                     Procedures Procedures  (including critical care time)  Medical Decision Making / ED Course   MDM:  72 year old male presenting to the emergency department with persistent bleeding.  Patient well-appearing, vital signs reassuring.  CBC obtained with no anemia.  Suspect persistent bleeding due to persistent effect of Eliquis following tooth extraction.  Attempted to apply TXA but patient spit out the gauze.  Attempted to apply TXA nebulizer without significant improvement.  Discussed with nursing and will attempt TXA application again with direct gauze application.  Patient tolerating it better this time.  Signed out to Dr. Karlton Lemon pending reassessment.  If bleeding stops likely discharge,  dental follow up.      Additional history obtained: -Additional history obtained from family    Lab Tests: -I ordered, reviewed, and interpreted labs.   The pertinent results include:   Labs Reviewed  CBC WITH DIFFERENTIAL/PLATELET -  Abnormal; Notable for the following components:      Result Value   RBC 3.79 (*)    HCT 36.4 (*)    MCH 34.3 (*)    Neutro Abs 8.8 (*)    All other components within normal limits    Notable for no anemia    Medicines ordered and prescription drug management: Meds ordered this encounter  Medications   tranexamic acid (CYKLOKAPRON) injection 500 mg   tranexamic acid (CYKLOKAPRON) 1000 MG/10ML nebulizer solution 500 mg   tranexamic acid (CYKLOKAPRON) injection 500 mg    -I have reviewed the patients home medicines and have made adjustments as needed   Social Determinants of Health:  Diagnosis or treatment significantly limited by social determinants of health: former smoker   Reevaluation: After the interventions noted above, I reevaluated the patient and found that they have stayed the same  Co morbidities that complicate the patient evaluation  Past Medical History:  Diagnosis Date   Alcohol withdrawal ([place]) 10/24/2019   Allergy    Anxiety    history of PTSD following CABG   Ascending aortic aneurysm ([place]) 01/31/2018   43 x 42 mm, pt unaware   Asthma    Cardiomegaly 10/17/2017   Colitis- colonoscopy 2014 07/13/2015   COPD (chronic obstructive pulmonary disease) ([place])    Coronary artery disease    x 6   Depression    Diabetes mellitus without complication ([place])    Family history of polyps in the colon    Finger dislocation    Left pinkie   GERD (gastroesophageal reflux disease)    Gout    H/O atrial fibrillation without current medication    following CABG with no documented episodes since then.   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  Despite best efforts to proofread,  errors can occur which can change the documentation meaning.    Cristie Hem, MD 11/06/22 [id number]

## 2022-11-09 ENCOUNTER — Telehealth: Payer: Self-pay

## 2022-11-09 NOTE — Progress Notes (Signed)
Patient ID: Adam [name], male   DOB: 1950/12/11, 72 y.o.   MRN: [id number] Care Management & Coordination Services Pharmacy Team  Reason for Encounter: Appointment Reminder  Contacted patient to confirm telephone appointment with Theo Dills, PharmD on 11/10/22 at 21. Spoke with patient on 11/09/2022        Star Rating Drugs:  Empagliflozin-metformin (Synjardy) 25-1000 mg - Last filled 11/03/22 90 DS at [facility] Rosuvastatin 40 mg - Last filled 09/06/22 90 DS at [facility] Lisinopril 10 mg - Last filled 10/26/22 90 DS at [facility]: AWV 12/30/21 TDAP - Overdue Foot Exam - Overdue COVID Booster - Overdue Lab Results  Component Value Date   HGBA1C 5.5 10/26/2022       [facility] Clinical Pharmacist Assistant [phone]

## 2022-11-11 ENCOUNTER — Encounter (HOSPITAL_COMMUNITY): Payer: Self-pay | Admitting: *Deleted

## 2022-11-12 ENCOUNTER — Ambulatory Visit: Payer: Medicare Other | Admitting: Emergency Medicine

## 2022-11-12 NOTE — Progress Notes (Signed)
[facility] Summary Report / Loop Recorder 

## 2022-11-14 DIAGNOSIS — E119 Type 2 diabetes mellitus without complications: Secondary | ICD-10-CM | POA: Diagnosis not present

## 2022-11-14 LAB — CUP PACEART REMOTE DEVICE CHECK
Date Time Interrogation Session: 20240209231301
Implantable Pulse Generator Implant Date: 20200928

## 2022-11-15 ENCOUNTER — Ambulatory Visit: Payer: Medicare Other

## 2022-11-15 DIAGNOSIS — I480 Paroxysmal atrial fibrillation: Secondary | ICD-10-CM

## 2022-12-03 ENCOUNTER — Ambulatory Visit: Payer: Medicare Other | Admitting: Family Medicine

## 2022-12-11 ENCOUNTER — Other Ambulatory Visit: Payer: Self-pay | Admitting: Family Medicine

## 2022-12-13 DIAGNOSIS — E119 Type 2 diabetes mellitus without complications: Secondary | ICD-10-CM | POA: Diagnosis not present

## 2022-12-15 NOTE — Progress Notes (Deleted)
Cardiology Office Note   Date:  06/11/2022   ID:  Adam [name], DOB 1951-01-29, MRN [id number]  PCP:  [name], MD  Cardiologist:   Cresencio Reesor Martinique, MD   Chief Complaint  Patient presents with   Follow-up   Coronary Artery Disease   Atrial Fibrillation      History of Present Illness: Adam [name] is a 72 y.o. male who presents for follow up CAD. He is a former patient of [name]. He is followed by Dr. Rayann Heman in the EP clinic for Afib.  last seen by me in Jan 2022. He has a past medical history which includes CABG in 2011 done in [place], HTN, HLD, DM, Atrial fibrillation, and OSA- previously followed by Dr Radford Pax. He underwent catheter ablation for atrial fibrillation by Dr. Rayann Heman 03/31/18. He was on Tikosyn and Xarelto. Event monitor in June 2020 showed no Afib. Because of intermittent palpitations Dr Rayann Heman placed an ILR in September.   He no longer follows with Dr Radford Pax since unable to tolerate CPAP.   He had an echo in January of 2019 that showed normal LVEF and no significant valvular abnormalities. Stress test in 2013 showed no ischemia. He had an abnormal chest CT done 01/2018 that showed an ascending aortic aneurysm, measuring 43 x 42 mm. Repeat CT angio in May 2020 showed no change in size. Last CT in 2021 showed greatest diameter 4.2 cm.   The patient reports that prior to CABG he had 25 stents placed over a 10-12 year period.  These procedures and his bypass were done in [place], [place]. He notes he previously exercised regularly but over the past 2 years hasn't been able to do much. 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   He was admitted in May 2021 with acute encephalopathy felt to be due to alcohol withdrawal and Wernicke's encephalopathy.  He was in Afib during this hospitalization initially w/RVR though rate controlled by discharge. He was later resumed on Tikosyn. He was at [facility] for [facility] for 7 months.   He was admitted in  June 2022 with Afib. Had DCCV. Switched from Tikosyn to amiodarone. When seen in Afib clinic in April was in NSR. Continues Etoh abuse. Progressive Wernicke's aphasia. Last pacemaker check May 25, 2022 showed no arrhythmia. Was seen in Afib clinic in October. Was in AFib at the time with burden 9.5%.   In Feb seen in ED with some gum bleeding post dental extraction.   He is seen today with his wife. Denies any chest pain. Has some SOB being followed by pulmonary. No palpitations, edema or orthopnea.    Past Medical History:  Diagnosis Date   Alcohol withdrawal ([place]) 10/24/2019   Allergy    Anxiety    history of PTSD following CABG   Ascending aortic aneurysm ([place]) 01/31/2018   43 x 42 mm, pt unaware   Asthma    Cardiomegaly 10/17/2017   Colitis- colonoscopy 2014 07/13/2015   COPD (chronic obstructive pulmonary disease) ([place])    Coronary artery disease    x 6   Depression    Diabetes mellitus without complication ([place])    Family history of polyps in the colon    Finger dislocation    Left pinkie   GERD (gastroesophageal reflux disease)    Gout    H/O atrial fibrillation without current medication    following CABG with no documented episodes since then.   Heart  palpitations    Hx of adenomatous colonic polyps 08/12/2010   Hyperlipidemia    Hypertension    OA (osteoarthritis)    OSA (obstructive sleep apnea)    Mild, has not received CPAP yet   Prediabetes    Protein-calorie malnutrition, severe 03/01/2020   RLS (restless legs syndrome)    Squamous cell carcinoma of scalp 2016   Moh's    Past Surgical History:  Procedure Laterality Date   ANKLE FRACTURE SURGERY Right 1991   APPENDECTOMY     ATRIAL FIBRILLATION ABLATION N/A 03/31/2018   Procedure: ATRIAL FIBRILLATION ABLATION;  Surgeon: [name], MD;  Location: [facility];  Service: Cardiovascular;  Laterality: N/A;   CARDIAC ELECTROPHYSIOLOGY MAPPING AND ABLATION     CARDIOVERSION N/A 03/23/2021    Procedure: CARDIOVERSION;  Surgeon: Acie Fredrickson Wonda Cheng, MD;  Location: [facility];  Service: Cardiovascular;  Laterality: N/A;   COLONOSCOPY  06/11/2021   COLONOSCOPY W/ BIOPSIES  2017   x7   CORONARY ANGIOPLASTY WITH STENT PLACEMENT     CORONARY ARTERY BYPASS GRAFT     FINGER SURGERY  04/2018   Small finger left hand   implantable loop recorder placement  07/02/2019   Medtronic Reveal [place] model LNQ11 (SN [id number]) implanted in office by Dr Rayann Heman   TEE WITHOUT CARDIOVERSION N/A 03/30/2018   Procedure: TRANSESOPHAGEAL ECHOCARDIOGRAM (TEE);  Surgeon: [name], MD;  Location: [facility];  Service: Cardiovascular;  Laterality: N/A;   TEE WITHOUT CARDIOVERSION N/A 03/23/2021   Procedure: TRANSESOPHAGEAL ECHOCARDIOGRAM (TEE);  Surgeon: Acie Fredrickson Wonda Cheng, MD;  Location: [facility];  Service: Cardiovascular;  Laterality: N/A;   TOTAL HIP ARTHROPLASTY Left    TOTAL HIP ARTHROPLASTY Right 09/12/2018   Procedure: TOTAL HIP ARTHROPLASTY ANTERIOR APPROACH;  Surgeon: [name], MD;  Location: [facility];  Service: Orthopedics;  Laterality: Right;  70 mins     Current Outpatient Medications  Medication Sig Dispense Refill   albuterol (PROVENTIL) (2.5 MG/3ML) 0.083% nebulizer solution Take 3 mLs (2.5 mg total) by nebulization every 6 (six) hours as needed for wheezing or shortness of breath. 75 mL 12   allopurinol (ZYLOPRIM) 300 MG tablet TAKE 1 TABLET(300 MG) BY MOUTH DAILY 90 tablet 0   amiodarone (PACERONE) 200 MG tablet TAKE 1 TABLET(200 MG) BY MOUTH DAILY 90 tablet 1   B COMPLEX-C-FOLIC ACID PO Take 1 tablet by mouth daily.     budesonide (PULMICORT) 0.5 MG/2ML nebulizer solution Take 2 mLs (0.5 mg total) by nebulization 2 (two) times daily. 60 mL 3   Continuous Blood Gluc Sensor (DEXCOM G7 SENSOR) MISC by Does not apply route. Every 10 days     DULoxetine (CYMBALTA) 60 MG capsule TAKE 1 CAPSULE(60 MG) BY MOUTH DAILY 90 capsule 0   ELIQUIS 5 MG TABS tablet TAKE 1 TABLET BY MOUTH TWICE  DAILY 180 tablet 0   Empagliflozin-metFORMIN HCl ER (SYNJARDY XR) 25-1000 MG TB24 Take 1 tablet by mouth daily. 90 tablet 0   gabapentin (NEURONTIN) 300 MG capsule TAKE 2 CAPSULES BY MOUTH AT NIGHT AS NEEDED FOR RESTLESS LEGS OR SYMPTOMS (Patient taking differently: Take 600 mg by mouth at bedtime as needed (restless legs or symptoms).) 180 capsule 3   HM LIDOCAINE PATCH EX Apply 1 patch topically as needed (pain).     Insulin Pen Needle (NOVOFINE PEN NEEDLE) 32G X 6 MM MISC Use 1-4 times daily as needed for insulin 100 each 3   Lancets (ONETOUCH ULTRASOFT) lancets Use 1-4 times daily as needed/directed  DX E11.9  200 each 12   Magnesium Oxide 400 MG CAPS Take 1 capsule (400 mg total) by mouth daily. 90 capsule 1   metoprolol tartrate (LOPRESSOR) 50 MG tablet TAKE 1 TABLET(50 MG) BY MOUTH TWICE DAILY 60 tablet 5   montelukast (SINGULAIR) 10 MG tablet Take 1 tablet (10 mg total) by mouth at bedtime. 90 tablet 0   rosuvastatin (CRESTOR) 40 MG tablet TAKE 1 TABLET(40 MG) BY MOUTH DAILY 90 tablet 0   SKYRIZI PEN 150 MG/ML SOAJ Inject into the skin. Takes every 3 months     sodium chloride (OCEAN) 0.65 % SOLN nasal spray Place 1 spray into both nostrils as needed for congestion.     Spacer/Aero-Holding Dorise Bullion Use with inhaler 1 each 1   thiamine (VITAMIN B-1) 100 MG tablet Take 500 mg by mouth daily.     TOUJEO SOLOSTAR 300 UNIT/ML Solostar Pen INJECT 60 UNITS INTO THE SKIN EVERY DAY (Patient taking differently: Inject 52 Units into the skin daily.) 33 mL 3   traZODone (DESYREL) 50 MG tablet TAKE 1/2 TO 1 TABLET(25 TO 50 MG) BY MOUTH AT BEDTIME AS NEEDED FOR SLEEP 30 tablet 1   arformoterol (BROVANA) 15 MCG/2ML NEBU Take 2 mLs (15 mcg total) by nebulization 2 (two) times daily. 120 mL 6   revefenacin (YUPELRI) 175 MCG/3ML nebulizer solution Take 3 mLs (175 mcg total) by nebulization daily. 40 mL 3   No current facility-administered medications for this visit.    Allergies:   Amoxicillin,  Augmentin [amoxicillin-pot clavulanate], Azithromycin, Clindamycin/lincomycin, Keflex [cephalexin], and Xarelto [rivaroxaban]    Social History:  The patient  reports that he quit smoking about 44 years ago. His smoking use included cigarettes. He has a 7.00 pack-year smoking history. He has never used smokeless tobacco. He reports current alcohol use of about 3.0 - 4.0 standard drinks of alcohol per week. He reports that he does not use drugs.   Family History:  The patient's family history includes Cancer in his father; Colon cancer in his mother; Heart disease in his paternal grandfather and paternal grandmother.    ROS:  Please see the history of present illness.   Otherwise, review of systems are positive for none.   All other systems are reviewed and negative.    PHYSICAL EXAM: VS:  Ht '5\' 10"'$  (1.778 m)   Wt 229 lb (103.9 kg)   BMI 32.86 kg/m  , BMI Body mass index is 32.86 kg/m. GEN: Well nourished, obese, in no acute distress  HEENT: normal  Neck: no JVD, carotid bruits, or masses Cardiac: RRR; no murmurs, rubs, or gallops,no edema  Respiratory:  clear to auscultation bilaterally, normal work of breathing GI: soft, nontender, nondistended, + BS MS: no deformity or atrophy  Skin: warm and dry, no rash Neuro:  Strength and sensation are intact Psych: euthymic mood, full affect   EKG:  EKG is not ordered today. The ekg ordered today demonstrates N/A   Recent Labs: 08/02/2021: Magnesium 2.0 08/05/2021: Hemoglobin 13.7; Platelets 283.0 01/27/2022: ALT 32; BUN 12; Creatinine, Ser 1.01; Potassium 4.0; Sodium 141; TSH 1.652    Lipid Panel    Component Value Date/Time   CHOL 137 09/23/2020 1216   CHOL 142 08/08/2019 1058   TRIG 280 (H) 09/23/2020 1216   HDL 40 09/23/2020 1216   HDL 37 (L) 08/08/2019 1058   CHOLHDL 3.4 09/23/2020 1216   VLDL 33 02/21/2020 0334   LDLCALC 64 09/23/2020 1216   LDLDIRECT 124.0 06/23/2018 1118  Wt Readings from Last 3 Encounters:   06/11/22 229 lb (103.9 kg)  06/03/22 233 lb 6.4 oz (105.9 kg)  04/21/22 231 lb (104.8 kg)      Other studies Reviewed: Additional studies/ records that were reviewed today include:  Echo 03/30/18: Study Conclusions   - Left ventricle: The cavity size was normal. Wall thickness was   normal. Systolic function was normal. The estimated ejection   fraction was in the range of 55% to 60%. Wall motion was normal;   there were no regional wall motion abnormalities. - Mitral valve: Mildly calcified annulus. - Left atrium: No evidence of thrombus in the atrial cavity or   appendage. No spontaneous echo contrast was observed. The   appendage was morphologically a left appendage, multilobulated,   and of normal size. Emptying velocity was mildly reduced. - Right atrium: No evidence of thrombus in the atrial cavity or   appendage. - Atrial septum: There was a very small patent foramen ovale.   Doppler showed a trivial left-to-right shunt, in the baseline   state.  Event monitor 03/07/19: Study Highlights  Sinus rhythm Rare premature ventricular contractions Nonsustained ventricular tachycardia No sustained arrhythmias No atrial fibrillation Baseline artifact limits interpretation at times.   Echo 02/11/20: IMPRESSIONS     1. Technically difficult study with limited views and patient  uncooperative. Grossly normal LV systolic function. Left ventricular  ejection fraction, by estimation, is 60 to 65%. Image quality is not  sufficient to assess for regional wall motiuon  abnormalities. There is mild left ventricular hypertrophy. Left  ventricular diastolic parameters are indeterminate.   2. Right ventricle is pooly visualized but grossly normal size with  mildly reduced systolic function.   3. The mitral valve is normal in structure. Mild mitral valve  regurgitation.   4. The aortic valve was not well visualized. Aortic valve regurgitation  is not visualized. No aortic stenosis is  present.   CT ANGIOGRAPHY CHEST WITH CONTRAST   TECHNIQUE: Multidetector CT imaging of the chest was performed using the standard protocol during bolus administration of intravenous contrast. Multiplanar CT image reconstructions and MIPs were obtained to evaluate the vascular anatomy.   CONTRAST:  24m OMNIPAQUE IOHEXOL 350 MG/ML SOLN   COMPARISON:  None.   FINDINGS: Cardiovascular: Stable mild aneurysmal disease of the proximal thoracic aorta. The aortic root measures approximately 4.0-4.1 cm at the level of the sinuses of Valsalva. The ascending thoracic aorta measures 4.2 cm in greatest estimated diameter. The proximal arch measures 3.5 cm and the distal arch 2.6 cm. The descending thoracic aorta measures 2.6 cm. No evidence of aortic dissection. Stable atherosclerosis of the arch and descending thoracic aorta.   Stable calcified plaque at the origins great vessels without significant stenosis. The heart size is stable and within normal limits. Evidence of prior CABG. No pericardial fluid. Central pulmonary arteries are normal in caliber.   Mediastinum/Nodes: No enlarged mediastinal, hilar, or axillary lymph nodes. Thyroid gland, trachea, and esophagus demonstrate no significant findings.   Lungs/Pleura: There is no evidence of pulmonary edema, consolidation, pneumothorax, nodule or pleural fluid.   Upper Abdomen: Stable probable hepatic steatosis. There may be some tiny gallstones in the visualized gallbladder.   Musculoskeletal: No chest wall abnormality. No acute or significant osseous findings.   Review of the MIP images confirms the above findings.   IMPRESSION: 1. Stable mild aneurysmal disease of the proximal thoracic aorta with maximal estimated diameter of 4.2 cm. 2. Stable probable hepatic steatosis. 3. Possible tiny  gallstones in the visualized gallbladder. 4. Aortic atherosclerosis.   Aortic aneurysm NOS (ICD10-I71.9).     Electronically Signed    By: Aletta Edouard M.D.   ASSESSMENT AND PLAN:  1. Thoracic aortic aneurysm: Incidental finding on a chest CT done April 2019, measuring at 43 x 42 mm. Repeat scan in September 2021 showed no change.  He is asymptomatic. Will arrange for repeat CT now. If no change can probably decrease surveillance to every 3 years given co morbidities.    2.  CAD: Status post CABG in 2011 done in [place]. Multiple stents prior to this. He has done well since CABG. No angina.   Last Myoview noted in our records was 2013 and was normal. Continue medical therapy with beta-blocker and statin.  He is not on aspirin due to Eliquis   3.  Paroxysmal atrial fibrillation: Followed by Dr. Rayann Heman  History of A fib ablation in June 2019. Recurrent Afib initially treated with Tikosyn but now on Amiodarone. No recurrent Afib on recent pacer check. Will update labs with LFTs and TSH.    4.  Obstructive sleep apnea: Reports intolerance of CPAP.    5. DM: management per PCP.   6. HTN. Currently well controlled.  7. Mixed HLD. On Crestor. Last lipid panel in Dec 2021. Will update today  8. Encephalopathy with Wernike's aphasia. related to history of Etoh use. Recommend complete alcohol abstinence   Disposition:   FU 6 months.  Signed, Foy Vanduyne Martinique, MD  06/11/2022 8:58 AM    [facility] [address], [place], [place], [address] Phone [phone], Fax [phone]

## 2022-12-16 ENCOUNTER — Ambulatory Visit: Payer: Medicare Other | Admitting: Cardiology

## 2022-12-17 ENCOUNTER — Other Ambulatory Visit: Payer: Self-pay | Admitting: Family Medicine

## 2022-12-17 LAB — CUP PACEART REMOTE DEVICE CHECK
Date Time Interrogation Session: 20240314001700
Implantable Pulse Generator Implant Date: 20200928

## 2022-12-20 ENCOUNTER — Ambulatory Visit (INDEPENDENT_AMBULATORY_CARE_PROVIDER_SITE_OTHER): Payer: Medicare Other

## 2022-12-20 DIAGNOSIS — I480 Paroxysmal atrial fibrillation: Secondary | ICD-10-CM | POA: Diagnosis not present

## 2022-12-28 NOTE — Progress Notes (Signed)
[facility] Summary Report / Loop Recorder 

## 2022-12-29 ENCOUNTER — Telehealth: Payer: Self-pay | Admitting: Family Medicine

## 2022-12-29 NOTE — Telephone Encounter (Signed)
Contacted Adam [name] to schedule their annual wellness visit. Appointment made for 01/05/23.  Barkley Boards [facility] direct phone # [phone]  Spoke with spouse [name]  she is aware of appt date and time change

## 2022-12-30 ENCOUNTER — Ambulatory Visit (INDEPENDENT_AMBULATORY_CARE_PROVIDER_SITE_OTHER): Payer: Medicare Other | Admitting: Emergency Medicine

## 2022-12-30 ENCOUNTER — Encounter: Payer: Self-pay | Admitting: Emergency Medicine

## 2022-12-30 VITALS — BP 130/72 | HR 67 | Temp 97.7°F | Wt 192.8 lb

## 2022-12-30 DIAGNOSIS — J4489 Other specified chronic obstructive pulmonary disease: Secondary | ICD-10-CM

## 2022-12-30 NOTE — Patient Instructions (Addendum)
Will hold off on starting any scheduled inhaler medication at this time Keep albuterol available to use 2 puffs if needed for shortness of breath, chest tightness, wheezing. Continue Singulair as you have been taking it Follow Dr. Lamonte Sakai in 1 year or sooner if you have any problems.  Please call if you have changes in your breathing.

## 2022-12-30 NOTE — Progress Notes (Signed)
   Subjective:    Patient ID: Adam [name], male    DOB: 04-Aug-1951, 72 y.o.   MRN: [id number]  COPD His past medical history is significant for COPD.    ROV 06/03/2022 --follow-up visit 72 year old man with a history of CAD/CABG, diastolic dysfunction with hypertension, atrial fibrillation. Significant cognitive impairment and difficulty speaking, ? Due to wernike's  I follow him for COPD/asthma as well as chronic cough in the setting of chronic rhinitis and GERD.  He was seen for an acute flare in April, principally for cough, was treated with prednisone.  He had been on nebulized bronchodilator therapy due to inability to deeply inhale Trelegy.  He was changed to [place] on 4/27 but did not get any benefit so he stopped this.  He was placed back on Brovana/budesonide plus Yupelri, but he hasn't started any of these - due to cost and because they say that they do not know how to do them. He uses mucinex.  Today he reports that he does not feels SOB. His wife reports that he is sedentary.   ROV 12/30/2022 --Adam [name] is 31 and follows up today for his history of COPD/asthma, chronic cough in the setting of this as well as chronic rhinitis and GERD.  He also has a history of CAD/CABG, diastolic dysfunction with hypertension and atrial fibrillation, cognitive impairment question due to Warnicke's.  He has been off of schedule inhaler medication since I saw him last August.  His wife is here with him to help with the history.  She reports that he isn't very active, but doesn't seem dyspneic. He has some dysphagia. Seems to cough more at night. On lisinopril. He has mucous and drainage. No GERD.        No data to display          Review of Systems As per hpi      Objective:   Physical Exam Vitals:   12/30/22 1103  BP: 130/72  Pulse: 67  Temp: 97.7 F (36.5 C)  TempSrc: Oral  SpO2: 98%  Weight: 192 lb 12.8 oz (87.5 kg)   Gen: Pleasant, obese, chronically ill-appearing  ENT: No lesions,   mouth clear,  oropharynx clear, no nasal congestion  Neck: No JVD, minimal upper airway noise  Lungs: Shallow breaths but clear bilaterally  Cardiovascular: RRR, heart sounds normal, no murmur or gallops, no edema  Musculoskeletal: No deformities, no cyanosis or clubbing  Neuro: Unable to speak.  He is communicating by writing notes.  No tremor  Skin: Warm, no lesions or rashes      Assessment & Plan:  COPD with asthma ([place]) He has COPD but has not missed scheduled bronchodilators.  He rarely needs albuterol.  He does have some cough that is associated principally with dysphagia (also on lisinopril).  I do not think we should restart scheduled medication at this time.  Discussed swallowing precautions with them.   Will hold off on starting any scheduled inhaler medication at this time Keep albuterol available to use 2 puffs if needed for shortness of breath, chest tightness, wheezing. Continue Singulair as you have been taking it Follow Dr. Lamonte Sakai in 1 year or sooner if you have any problems.  Please call if you have changes in your breathing.  Baltazar Apo, MD, PhD 12/30/2022, 11:27 AM [name] [phone] or if no answer [phone]

## 2022-12-30 NOTE — Assessment & Plan Note (Signed)
He has COPD but has not missed scheduled bronchodilators.  He rarely needs albuterol.  He does have some cough that is associated principally with dysphagia (also on lisinopril).  I do not think we should restart scheduled medication at this time.  Discussed swallowing precautions with them.   Will hold off on starting any scheduled inhaler medication at this time Keep albuterol available to use 2 puffs if needed for shortness of breath, chest tightness, wheezing. Continue Singulair as you have been taking it Follow Dr. Lamonte Sakai in 1 year or sooner if you have any problems.  Please call if you have changes in your breathing.

## 2023-01-04 NOTE — Progress Notes (Unsigned)
Cardiology Clinic Note   Date: 01/06/2023 ID: Adam [name], DOB [birth year], MRN [id number]  Primary Cardiologist:  Peter Martinique, MD  Patient Profile    Adam [name] is a 72 y.o. male who presents to the clinic today for 3-month follow-up.  Past medical history significant for: CAD. MI in [place] 1995. First stent placement in [place] 1996. Per patient, on balloon pump after procedure. Patient report of 25 stents: 15 stents to RCA, 10 stents to left system. CABG x 6 in [place]: LIMA to LAD, RIMA to diagonal, left radial artery to LCx, SVG to PDA, SVG to PLSA, questionable SVG to post vent branch. Nuclear stress test 06/13/2012: No evidence of fixed or reversible ischemia. Persistent A-fib. S/p A-fib ablation 03/31/2018. Event monitor 03/07/2019: Sinus rhythm, rare PVCs, nonsustained VT, no A-fib, no sustained arrhythmias.  Recommend implantable loop recorder for A-fib management post ablation. S/p loop recorder implantation 07/02/2019. Remote device check 12/16/2022: Normal device function, battery status okay.  No arrhythmias seen. Aortic aneurysm. CTA chest aorta 06/17/2022: Stable mild aneurysmal dilatation of ascending thoracic aorta, 4.2 cm.   Per Dr. Martinique will repeat in 3 years. Hypertension. Hyperlipidemia. Lipid panel 06/11/2022: LDL 51, HDL 37, TG 186, total 119. T2DM. COPD. OSA. GERD. Alcohol abuse. Warnicke's encephalopathy. Aphasic.   History of Present Illness    Adam [name] is a longtime patient of cardiology outside of [place].  He was first evaluated by Dr. Harrington Challenger on 05/31/2013 after moving to the area from [place].  Patient was initially followed by Dr. Wynonia Lawman.  He established general cardiology care with Dr. Martinique on 08/13/2019.  He is also followed by EP and has undergone evaluation for OSA with Dr. Radford Pax.  Patient was last seen in the office by Dr. Martinique on 06/11/2022.  He reported shortness of breath which is followed by pulmonary.  Otherwise  stable from cardiac standpoint and no medication changes were made.  He was last evaluated by Roderic Palau, NP in the A-fib clinic on 07/29/2022.  He had no complaints and no medication changes were made.  Today, patient is accompanied by his wife. He is aphasic and communicates with hand gestures and writing on a white board. His wife also assists with history. He denies shortness of breath or dyspnea on exertion. No chest pain, pressure, or tightness. Denies lower extremity edema, orthopnea, or PND. No palpitations. He denies blood in urine or stool.  He does not exercise secondary to balance issues. He has a walker when he ambulates around his home. He does not use it out in public. He is tolerating all of his medications.    ROS: All other systems reviewed and are otherwise negative except as noted in History of Present Illness.  Studies Reviewed    ECG is not ordered today.   Risk Assessment/Calculations     CHA2DS2-VASc Score = 4   This indicates a 4.8% annual risk of stroke. The patient's score is based upon: CHF History: 0 HTN History: 1 Diabetes History: 1 Stroke History: 0 Vascular Disease History: 1 Age Score: 1 Gender Score: 0             Physical Exam    VS:  BP 136/68 (BP Location: Right Arm, Patient Position: Sitting, Cuff Size: Large)   Pulse 76   Ht 5\' 10"  (1.778 m)   Wt 194 lb (88 kg)   BMI 27.84 kg/m  , BMI Body mass index is 27.84 kg/m.  GEN: Well nourished, well  developed, in no acute distress. Very poor dentition.  Neck: No JVD or carotid bruits. Cardiac:  RRR. No murmurs. No rubs or gallops.   Respiratory:  Respirations regular and unlabored. Clear to auscultation without rales, wheezing or rhonchi. GI: Soft, nontender, nondistended. Extremities: Radials/DP/PT 2+ and equal bilaterally. No clubbing or cyanosis. No edema.  Skin: Warm and dry, no rash. Neuro: Strength intact.  Assessment & Plan   CAD.  Extensive history of PCI starting in 1996.   S/p CABG x 6 performed in [place].  Nuclear stress test September 2013 with no evidence of fixed or reversible ischemia.  Patient denies chest pain, pressure, or tightness. He is not active secondary to balance issues. Continue Eliquis, metoprolol, rosuvastatin.  Patient not on aspirin secondary to Eliquis. Persistent A-fib.  S/p A-fib ablation June 2019.  Implantable loop recorder September 2020.  Remote device check March 2024 showed normal device function, battery status okay, no arrhythmias detected.  Patient denies palpitations since starting on amiodarone. He denies spontaneous bleeding concerns. Continue amiodarone, Eliquis, metoprolol.  Will defer amiodarone surveillance to A-fib clinic (upcoming appointment at the end of the month). Thoracic aortic aneurysm.  CTA chest aorta September 2023 showed stable mild aneurysmal dilatation of ascending thoracic aorta 4.2 cm.  Per Dr. Doug Sou plan will repeat in 3 years. Hypertension.  BP today 136/68 at intake and 122/68 at recheck. Patient denies dizziness or headaches.  Continue lisinopril and metoprolol. Hyperlipidemia.  LDL September 2023 51, at goal.  Continue rosuvastatin. Warnicke's aphasia.  Patient communicates with hand gestures and writing on a white board. His wife also assists with history. He appears to understand all questions and responds appropriately.   Disposition: Keep scheduled appointment with afib clinic. Return in 6 months or sooner as needed.          Signed, Justice Britain. Navy Belay, DNP, NP-C

## 2023-01-05 ENCOUNTER — Ambulatory Visit (INDEPENDENT_AMBULATORY_CARE_PROVIDER_SITE_OTHER): Payer: Medicare Other

## 2023-01-05 VITALS — BP 120/60 | HR 68 | Temp 98.4°F | Ht 70.0 in | Wt 194.2 lb

## 2023-01-05 DIAGNOSIS — Z0000 Encounter for general adult medical examination without abnormal findings: Secondary | ICD-10-CM

## 2023-01-05 NOTE — Progress Notes (Signed)
Subjective:   Adam [name] is a 72 y.o. male who presents for Medicare Annual/Subsequent preventive examination.  Review of Systems     Cardiac Risk Factors include: advanced age (>26men, >79 women);diabetes mellitus;male gender;hypertension     Objective:    Today's Vitals   01/05/23 1338  BP: 120/60  Pulse: 68  Temp: 98.4 F (36.9 C)  TempSrc: Oral  SpO2: 97%  Weight: 194 lb 3.2 oz (88.1 kg)  Height: 5\' 10"  (1.778 m)   Body mass index is 27.86 kg/m.     01/05/2023    2:02 PM 11/05/2022    9:06 PM 12/30/2021    4:08 PM 07/31/2021    4:10 AM 03/23/2021   11:00 PM 03/23/2021    7:02 AM 03/21/2021    2:00 PM  Advanced Directives  Does Patient Have a Medical Advance Directive? Yes No Yes No  Yes No  Type of Paramedic of [place];Living will  [place];Living will      Does patient want to make changes to medical advance directive?   No - Patient declined      Copy of [place] in Chart? No - copy requested  No - copy requested      Would patient like information on creating a medical advance directive?  No - Patient declined  No - Patient declined No - Guardian declined  No - Guardian declined    Current Medications (verified) Outpatient Encounter Medications as of 01/05/2023  Medication Sig   allopurinol (ZYLOPRIM) 300 MG tablet TAKE 1 TABLET(300 MG) BY MOUTH DAILY   amiodarone (PACERONE) 200 MG tablet TAKE 1 TABLET(200 MG) BY MOUTH DAILY   apixaban (ELIQUIS) 5 MG TABS tablet Take 1 tablet (5 mg total) by mouth 2 (two) times daily.   B COMPLEX-C-FOLIC ACID PO Take 1 tablet by mouth daily.   Continuous Blood Gluc Sensor (DEXCOM G7 SENSOR) MISC by Does not apply route. Every 10 days   DULoxetine (CYMBALTA) 60 MG capsule Take one capsule by mouth once daily   gabapentin (NEURONTIN) 300 MG capsule TAKE 2 CAPSULES BY MOUTH AT NIGHT AS NEEDED FOR RESTLESS LEGS OR SYMPTOMS   HM LIDOCAINE PATCH EX Apply 1 patch  topically as needed (pain).   Insulin Pen Needle (NOVOFINE PEN NEEDLE) 32G X 6 MM MISC Use 1-4 times daily as needed for insulin   lisinopril (ZESTRIL) 10 MG tablet Take 1 tablet (10 mg total) by mouth daily.   Magnesium Oxide 400 MG CAPS Take 1 capsule (400 mg total) by mouth daily.   metoprolol tartrate (LOPRESSOR) 50 MG tablet TAKE 1 TABLET(50 MG) BY MOUTH TWICE DAILY   montelukast (SINGULAIR) 10 MG tablet Take 1 tablet (10 mg total) by mouth at bedtime.   rosuvastatin (CRESTOR) 40 MG tablet TAKE 1 TABLET(40 MG) BY MOUTH DAILY   SKYRIZI PEN 150 MG/ML SOAJ Inject into the skin. Takes every 3 months   sodium chloride (OCEAN) 0.65 % SOLN nasal spray Place 1 spray into both nostrils as needed for congestion.   Spacer/Aero-Holding Chambers DEVI Use with inhaler   SYNJARDY XR 25-1000 MG TB24 TAKE 1 TABLET BY MOUTH DAILY   thiamine (VITAMIN B-1) 100 MG tablet Take 500 mg by mouth daily.   TOUJEO SOLOSTAR 300 UNIT/ML Solostar Pen INJECT 60 UNITS INTO THE SKIN EVERY DAY (Patient taking differently: Inject 52 Units into the skin daily.)   No facility-administered encounter medications on file as of 01/05/2023.  Allergies (verified) Amoxicillin, Augmentin [amoxicillin-pot clavulanate], Azithromycin, Clindamycin/lincomycin, Keflex [cephalexin], and Xarelto [rivaroxaban]   History: Past Medical History:  Diagnosis Date   Alcohol withdrawal 10/24/2019   Allergy    Anxiety    history of PTSD following CABG   Ascending aortic aneurysm 01/31/2018   43 x 42 mm, pt unaware   Asthma    Cardiomegaly 10/17/2017   Colitis- colonoscopy 2014 07/13/2015   COPD (chronic obstructive pulmonary disease)    Coronary artery disease    x 6   Depression    Diabetes mellitus without complication    Family history of polyps in the colon    Finger dislocation    Left pinkie   GERD (gastroesophageal reflux disease)    Gout    H/O atrial fibrillation without current medication    following CABG with no  documented episodes since then.   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   Past Surgical History:  Procedure Laterality Date   ANKLE FRACTURE SURGERY Right 1991   APPENDECTOMY     ATRIAL FIBRILLATION ABLATION N/A 03/31/2018   Procedure: ATRIAL FIBRILLATION ABLATION;  Surgeon: [name], MD;  Location: [facility];  Service: Cardiovascular;  Laterality: N/A;   CARDIAC ELECTROPHYSIOLOGY MAPPING AND ABLATION     CARDIOVERSION N/A 03/23/2021   Procedure: CARDIOVERSION;  Surgeon: Acie Fredrickson Wonda Cheng, MD;  Location: [facility];  Service: Cardiovascular;  Laterality: N/A;   COLONOSCOPY  06/11/2021   COLONOSCOPY W/ BIOPSIES  2017   x7   CORONARY ANGIOPLASTY WITH STENT PLACEMENT     CORONARY ARTERY BYPASS GRAFT     FINGER SURGERY  04/2018   Small finger left hand   implantable loop recorder placement  07/02/2019   Medtronic Reveal [place] model LNQ11 (SN [id number]) implanted in office by Dr Rayann Heman   TEE WITHOUT CARDIOVERSION N/A 03/30/2018   Procedure: TRANSESOPHAGEAL ECHOCARDIOGRAM (TEE);  Surgeon: [name], MD;  Location: [facility];  Service: Cardiovascular;  Laterality: N/A;   TEE WITHOUT CARDIOVERSION N/A 03/23/2021   Procedure: TRANSESOPHAGEAL ECHOCARDIOGRAM (TEE);  Surgeon: Acie Fredrickson Wonda Cheng, MD;  Location[facility];  Service: Cardiovascular;  Laterality: N/A;   TOTAL HIP ARTHROPLASTY Left    TOTAL HIP ARTHROPLASTY Right 09/12/2018   Procedure: TOTAL HIP ARTHROPLASTY ANTERIOR APPROACH;  Surgeon: [name], MD;  Location: [facility];  Service: Orthopedics;  Laterality: Right;  70 mins   Family History  Problem Relation Age of Onset   Colon cancer Mother    Cancer  Father        UNKNOWN TYPE   Heart disease Paternal Grandmother    Heart disease Paternal Grandfather    Esophageal cancer Neg Hx    Rectal cancer Neg Hx    Stomach cancer Neg Hx    Social History   Socioeconomic History   Marital status: Married    Spouse name: Not on file   Number of children: 0   Years of education: Not on file   Highest education level: Not on file  Occupational History   Occupation: retired  Tobacco Use   Smoking status: Former    Packs/day: 1.00    Years: 7.00  Additional pack years: 0.00    Total pack years: 7.00    Types: Cigarettes    Quit date: 10/04/1977    Years since quitting: 45.2   Smokeless tobacco: Never   Tobacco comments:    never smoked over 1 pack   Vaping Use   Vaping Use: Never used  Substance and Sexual Activity   Alcohol use: Yes    Alcohol/week: 3.0 - 4.0 standard drinks of alcohol    Types: 1 Glasses of wine, 2 - 3 Shots of liquor per week    Comment: DAILY   Drug use: No    Comment: Smoked Marijuana back in 1970s   Sexual activity: Yes  Other Topics Concern   Not on file  Social History Narrative   Married, no children   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 - returned to [employer] from [place]   Enjoys bird watching, reading historical books about WWII, watching tv   Social Determinants of Health   Financial Resource Strain: Low Risk  (01/05/2023)   Overall Financial Resource Strain (CARDIA)    Difficulty of Paying Living Expenses: Not hard at all  Food Insecurity: No Food Insecurity (01/05/2023)   Hunger Vital Sign    Worried About Running Out of Food in the Last Year: Never true    [place] in the Last Year: Never true  Transportation Needs: No Transportation Needs (01/05/2023)   PRAPARE - Hydrologist (Medical): No    Lack of Transportation (Non-Medical): No  Physical Activity: Inactive (01/05/2023)   Exercise Vital Sign    Days of Exercise per  Week: 0 days    Minutes of Exercise per Session: 0 min  Stress: No Stress Concern Present (01/05/2023)   [place]    Feeling of Stress : Not at all  Social Connections: Moderately Isolated (01/05/2023)   Social Connection and Isolation Panel [NHANES]    Frequency of Communication with Friends and Family: More than three times a week    Frequency of Social Gatherings with Friends and Family: More than three times a week    Attends Religious Services: Never    Marine scientist or Organizations: No    Attends Music therapist: Never    Marital Status: Married    Tobacco Counseling Counseling given: Not Answered Tobacco comments: never smoked over 1 pack    Clinical Intake:  Pre-visit preparation completed: No  Pain : No/denies pain   Nutrition Risk Assessment:  Has the patient had any N/V/D within the last 2 months?  No  Does the patient have any non-healing wounds?  No  Has the patient had any unintentional weight loss or weight gain?  No   Diabetes:  Is the patient diabetic?  Yes  If diabetic, was a CBG obtained today?  Yes CBG 125 Taken by patient Did the patient bring in their glucometer from home?  No  How often do you monitor your CBG's? Daily/Monitor.   Financial Strains and Diabetes Management:  Are you having any financial strains with the device, your supplies or your medication? No .  Does the patient want to be seen by Chronic Care Management for management of their diabetes?  No  Would the patient like to be referred to a Nutritionist or for Diabetic Management?  No   Diabetic Exams:  Diabetic Eye Exam: Completed Yes.  Overdue for diabetic eye exam. Pt has been advised about the importance in completing this exam. A referral has been placed today. Message sent to referral coordinator for scheduling purposes. Advised pt to expect a call from office referred to regarding  appt.  Diabetic Foot Exam: Completed Yes. Pt has been advised about the importance in completing this exam. Pt is scheduled for diabetic foot exam on Followed by PCP.    BMI - recorded: 27.86 Nutritional Status: BMI 25 -29 Overweight Nutritional Risks: None Diabetes: Yes CBG done?: Yes CBG resulted in Enter/ Edit results?: Yes (CBG 125 Taken by patient) Did pt. bring in CBG monitor from home?: No  How often do you need to have someone help you when you read instructions, pamphlets, or other written materials from your doctor or pharmacy?: 3 - Sometimes (Wife assist)  Diabetic?  Yes  Interpreter Needed?: No  Information entered by :: Rolene Arbour LPN   Activities of Daily Living    01/05/2023    1:58 PM  In your present state of health, do you have any difficulty performing the following activities:  Hearing? 0  Vision? 0  Difficulty concentrating or making decisions? 0  Walking or climbing stairs? 0  Dressing or bathing? 0  Doing errands, shopping? 0  Comment Wife assist  Preparing Food and eating ? N  Using the Toilet? N  In the past six months, have you accidently leaked urine? Y  Comment Wears pull-ups. Followed by PCP  Do you have problems with loss of bowel control? N  Managing your Medications? N  Managing your Finances? Y  Comment Wife assist  Housekeeping or managing your Housekeeping? Y  Comment Wife assist    Patient Care Team: [name], MD as PCP - General (Family Medicine) [name], MD (Inactive) as PCP - Electrophysiology (Cardiology) Sueanne Margarita, MD as PCP - Sleep Medicine (Sleep Medicine) [name], MD as PCP - Cardiology (Cardiology) Domenic Polite, MD as Referring Physician (Internal Medicine) Viona Gilmore, [facility] (Inactive) as Pharmacist (Pharmacist)  Indicate any recent Medical Services you may have received from other than [facility] providers in the past year (date may be approximate).     Assessment:   This is a  routine wellness examination for Dorlan.  Hearing/Vision screen Hearing Screening - Comments:: Denies hearing difficulties   Vision Screening - Comments:: Wears reading glasses - up to date with routine eye exams with Dr Nicki Reaper   Dietary issues and exercise activities discussed: Current Exercise Habits: The patient does not participate in regular exercise at present, Exercise limited by: orthopedic condition(s)   Goals Addressed               This Visit's Progress     decrease pain (pt-stated)        Decrease pain. Maintain current lifestyle.      No current goals (pt-stated)         Depression Screen    01/05/2023    1:57 PM 01/05/2023    1:56 PM 09/03/2022   11:29 AM 07/23/2022   12:01 PM 12/30/2021    4:00 PM 12/24/2020   11:40 AM 11/20/2019   10:48 AM  PHQ 2/9 Scores  PHQ - 2 Score 0 0 6 5 0 0 4  PHQ- 9 Score   21 20   7     Fall Risk    01/05/2023    2:00 PM 07/23/2022   12:03 PM 12/30/2021    4:05 PM 12/24/2020  11:38 AM 11/20/2019   11:01 AM  Fall Risk   Falls in the past year? 1 1 1 1 1   Number falls in past yr: 0 1 0 0 1  Injury with Fall? 0 0 0 1 0  Comment   Bruising to arms and legs. No medical attention needed    Risk for fall due to : No Fall Risks No Fall Risks No Fall Risks History of fall(s);Impaired balance/gait Medication side effect;Orthopedic patient;Impaired balance/gait;History of fall(s)  Follow up Falls prevention discussed Falls evaluation completed  Falls prevention discussed;Falls evaluation completed Falls evaluation completed;Education provided;Falls prevention discussed    FALL RISK PREVENTION PERTAINING TO THE HOME:  Any stairs in or around the home? No  If so, are there any without handrails? No  Home free of loose throw rugs in walkways, pet beds, electrical cords, etc? Yes  Adequate lighting in your home to reduce risk of falls? Yes   ASSISTIVE DEVICES UTILIZED TO PREVENT FALLS:  Life alert? No  Use of a cane, walker or w/c? No   Grab bars in the bathroom? Yes  Shower chair or bench in shower? Yes  Elevated toilet seat or a handicapped toilet? Yes   TIMED UP AND GO:  Was the test performed? Yes .  Length of time to ambulate 10 feet: 10 sec.   Gait slow and steady without use of assistive device  Cognitive Function:        01/05/2023    2:02 PM 12/30/2021    4:08 PM 12/24/2020   11:44 AM 11/20/2019   11:07 AM  6CIT Screen  What Year? 0 points 0 points 0 points 0 points  What month? 0 points 0 points 0 points 0 points  What time? 0 points 0 points 0 points 0 points  Count back from 20 0 points 0 points 0 points 0 points  Months in reverse 0 points 0 points 0 points 0 points  Repeat phrase 0 points 0 points 0 points 0 points  Total Score 0 points 0 points 0 points 0 points    Immunizations Immunization History  Administered Date(s) Administered   Fluad Quad(high Dose 65+) 06/25/2019, 07/18/2020, 07/10/2021, 07/01/2022   Influenza Split 07/04/2012   Influenza, High Dose Seasonal PF 05/28/2016, 06/08/2017, 06/23/2018   Influenza,inj,Quad PF,6+ Mos 07/05/2013, 05/22/2014, 06/17/2015   PFIZER(Purple Top)SARS-COV-2 Vaccination 12/08/2019, 01/08/2020, 09/28/2020   Pneumococcal Conjugate-13 12/01/2015   Pneumococcal Polysaccharide-23 10/05/2006, 11/30/2016   Td 10/06/2009   Zoster Recombinat (Shingrix) 12/02/2016, 04/02/2017   Zoster, Live 08/21/2013    TDAP status: Due, Education has been provided regarding the importance of this vaccine. Advised may receive this vaccine at local pharmacy or Health Dept. Aware to provide a copy of the vaccination record if obtained from local pharmacy or Health Dept. Verbalized acceptance and understanding.  Flu Vaccine status: Up to date  Pneumococcal vaccine status: Up to date  Covid-19 vaccine status: Completed vaccines  Qualifies for Shingles Vaccine? Yes   Zostavax completed Yes   Shingrix Completed?: Yes  Screening Tests Health Maintenance  Topic Date Due    DTaP/Tdap/Td (2 - Tdap) 10/07/2019   FOOT EXAM  09/23/2020   COVID-19 Vaccine (4 - 2023-24 season) 01/21/2023 (Originally 06/04/2022)   HEMOGLOBIN A1C  04/26/2023   INFLUENZA VACCINE  05/05/2023   Diabetic kidney evaluation - eGFR measurement  06/12/2023   Diabetic kidney evaluation - Urine ACR  07/31/2023   OPHTHALMOLOGY EXAM  09/01/2023   Medicare Annual Wellness (AWV)  01/05/2024   Pneumonia  Vaccine 41+ Years old  Completed   Hepatitis C Screening  Completed   Zoster Vaccines- Shingrix  Completed   HPV VACCINES  Aged Out   COLONOSCOPY (Pts 45-32yrs Insurance coverage will need to be confirmed)  Discontinued    Health Maintenance  Health Maintenance Due  Topic Date Due   DTaP/Tdap/Td (2 - Tdap) 10/07/2019   FOOT EXAM  09/23/2020      Lung Cancer Screening: (Low Dose CT Chest recommended if Age 47-80 years, 30 pack-year currently smoking OR have quit w/in 15years.) does not qualify.     Additional Screening:  Hepatitis C Screening: does qualify; Completed 06/15/17  Vision Screening: Recommended annual ophthalmology exams for early detection of glaucoma and other disorders of the eye. Is the patient up to date with their annual eye exam?  Yes  Who is the provider or what is the name of the office in which the patient attends annual eye exams? Dr Nicki Reaper If pt is not established with a provider, would they like to be referred to a provider to establish care? No .   Dental Screening: Recommended annual dental exams for proper oral hygiene  Community Resource Referral / Chronic Care Management:  CRR required this visit?  No   CCM required this visit?  No      Plan:     I have personally reviewed and noted the following in the patient's chart:   Medical and social history Use of alcohol, tobacco or illicit drugs  Current medications and supplements including opioid prescriptions. Patient is not currently taking opioid prescriptions. Functional ability and  status Nutritional status Physical activity Advanced directives List of other physicians Hospitalizations, surgeries, and ER visits in previous 12 months Vitals Screenings to include cognitive, depression, and falls Referrals and appointments  In addition, I have reviewed and discussed with patient certain preventive protocols, quality metrics, and best practice recommendations. A written personalized care plan for preventive services as well as general preventive health recommendations were provided to patient.     Criselda Peaches, LPN   [phone]   Nurse Notes:   None

## 2023-01-05 NOTE — Patient Instructions (Addendum)
Adam [name] , Thank you for taking time to come for your Medicare Wellness Visit. I appreciate your ongoing commitment to your health goals. Please review the following plan we discussed and let me know if I can assist you in the future.   These are the goals we discussed:  Goals       decrease pain (pt-stated)      Decrease pain. Maintain current lifestyle.      Exercise 3x per week (30 min per time)      Daily building gait and balance      mental health outlet      Consider counseling or therapy as an outlet to express feelings about declining health and aging      Monitor and Manage My Blood Sugar-Diabetes Type 2      Timeframe:  Long-Range Goal Priority:  High Start Date:                             Expected End Date:                       Follow Up Date 01/17/22    - check blood sugar at prescribed times - check blood sugar if I feel it is too high or too low - take the blood sugar meter to all doctor visits    Why is this important?   Checking your blood sugar at home helps to keep it from getting very high or very low.  Writing the results in a diary or log helps the doctor know how to care for you.  Your blood sugar log should have the time, date and the results.  Also, write down the amount of insulin or other medicine that you take.  Other information, like what you ate, exercise done and how you were feeling, will also be helpful.     Notes:       No current goals (pt-stated)      Prevent falls      Track and Manage My Blood Pressure-Hypertension      Timeframe:  Long-Range Goal Priority:  High Start Date:                             Expected End Date:                       Follow Up Date 01/17/22    - check blood pressure weekly - choose a place to take my blood pressure (home, clinic or office, retail store) - write blood pressure results in a log or diary    Why is this important?   You won't feel high blood pressure, but it can still hurt your blood  vessels.  High blood pressure can cause heart or kidney problems. It can also cause a stroke.  Making lifestyle changes like losing a little weight or eating less salt will help.  Checking your blood pressure at home and at different times of the day can help to control blood pressure.  If the doctor prescribes medicine remember to take it the way the doctor ordered.  Call the office if you cannot afford the medicine or if there are questions about it.     Notes:         This is a list of the screening recommended for you and due dates:  Health Maintenance  Topic Date Due   DTaP/Tdap/Td vaccine (2 - Tdap) 10/07/2019   Complete foot exam   09/23/2020   COVID-19 Vaccine (4 - 2023-24 season) 01/21/2023*   Hemoglobin A1C  04/26/2023   Flu Shot  05/05/2023   Yearly kidney function blood test for diabetes  06/12/2023   Yearly kidney health urinalysis for diabetes  07/31/2023   Eye exam for diabetics  09/01/2023   Medicare Annual Wellness Visit  01/05/2024   Pneumonia Vaccine  Completed   Hepatitis C Screening: USPSTF Recommendation to screen - Ages 108-79 yo.  Completed   Zoster (Shingles) Vaccine  Completed   HPV Vaccine  Aged Out   Colon Cancer Screening  Discontinued  *Topic was postponed. The date shown is not the original due date.    Advanced directives: Please bring a copy of your health care power of attorney and living will to the office to be added to your chart at your convenience.   Conditions/risks identified: None  Next appointment: Follow up in one year for your annual wellness visit.   Preventive Care 17 Years and Older, Male  Preventive care refers to lifestyle choices and visits with your health care provider that can promote health and wellness. What does preventive care include? A yearly physical exam. This is also called an annual well check. Dental exams once or twice a year. Routine eye exams. Ask your health care provider how often you should have your  eyes checked. Personal lifestyle choices, including: Daily care of your teeth and gums. Regular physical activity. Eating a healthy diet. Avoiding tobacco and drug use. Limiting alcohol use. Practicing safe sex. Taking low doses of aspirin every day. Taking vitamin and mineral supplements as recommended by your health care provider. What happens during an annual well check? The services and screenings done by your health care provider during your annual well check will depend on your age, overall health, lifestyle risk factors, and family history of disease. Counseling  Your health care provider may ask you questions about your: Alcohol use. Tobacco use. Drug use. Emotional well-being. Home and relationship well-being. Sexual activity. Eating habits. History of falls. Memory and ability to understand (cognition). Work and work Statistician. Screening  You may have the following tests or measurements: Height, weight, and BMI. Blood pressure. Lipid and cholesterol levels. These may be checked every 5 years, or more frequently if you are over 81 years old. Skin check. Lung cancer screening. You may have this screening every year starting at age 14 if you have a 30-pack-year history of smoking and currently smoke or have quit within the past 15 years. Fecal occult blood test (FOBT) of the stool. You may have this test every year starting at age 37. Flexible sigmoidoscopy or colonoscopy. You may have a sigmoidoscopy every 5 years or a colonoscopy every 10 years starting at age 39. Prostate cancer screening. Recommendations will vary depending on your family history and other risks. Hepatitis C blood test. Hepatitis B blood test. Sexually transmitted disease (STD) testing. Diabetes screening. This is done by checking your blood sugar (glucose) after you have not eaten for a while (fasting). You may have this done every 1-3 years. Abdominal aortic aneurysm (AAA) screening. You may need  this if you are a current or former smoker. Osteoporosis. You may be screened starting at age 50 if you are at high risk. Talk with your health care provider about your test results, treatment options, and if necessary, the need for more  tests. Vaccines  Your health care provider may recommend certain vaccines, such as: Influenza vaccine. This is recommended every year. Tetanus, diphtheria, and acellular pertussis (Tdap, Td) vaccine. You may need a Td booster every 10 years. Zoster vaccine. You may need this after age 47. Pneumococcal 13-valent conjugate (PCV13) vaccine. One dose is recommended after age 84. Pneumococcal polysaccharide (PPSV23) vaccine. One dose is recommended after age 50. Talk to your health care provider about which screenings and vaccines you need and how often you need them. This information is not intended to replace advice given to you by your health care provider. Make sure you discuss any questions you have with your health care provider. Document Released: 10/17/2015 Document Revised: 06/09/2016 Document Reviewed: 07/22/2015 Elsevier Interactive Patient Education  2017 [place] Prevention in the Home Falls can cause injuries. They can happen to people of all ages. There are many things you can do to make your home safe and to help prevent falls. What can I do on the outside of my home? Regularly fix the edges of walkways and driveways and fix any cracks. Remove anything that might make you trip as you walk through a door, such as a raised step or threshold. Trim any bushes or trees on the path to your home. Use bright outdoor lighting. Clear any walking paths of anything that might make someone trip, such as rocks or tools. Regularly check to see if handrails are loose or broken. Make sure that both sides of any steps have handrails. Any raised decks and porches should have guardrails on the edges. Have any leaves, snow, or ice cleared regularly. Use  sand or salt on walking paths during winter. Clean up any spills in your garage right away. This includes oil or grease spills. What can I do in the bathroom? Use night lights. Install grab bars by the toilet and in the tub and shower. Do not use towel bars as grab bars. Use non-skid mats or decals in the tub or shower. If you need to sit down in the shower, use a plastic, non-slip stool. Keep the floor dry. Clean up any water that spills on the floor as soon as it happens. Remove soap buildup in the tub or shower regularly. Attach bath mats securely with double-sided non-slip rug tape. Do not have throw rugs and other things on the floor that can make you trip. What can I do in the bedroom? Use night lights. Make sure that you have a light by your bed that is easy to reach. Do not use any sheets or blankets that are too big for your bed. They should not hang down onto the floor. Have a firm chair that has side arms. You can use this for support while you get dressed. Do not have throw rugs and other things on the floor that can make you trip. What can I do in the kitchen? Clean up any spills right away. Avoid walking on wet floors. Keep items that you use a lot in easy-to-reach places. If you need to reach something above you, use a strong step stool that has a grab bar. Keep electrical cords out of the way. Do not use floor polish or wax that makes floors slippery. If you must use wax, use non-skid floor wax. Do not have throw rugs and other things on the floor that can make you trip. What can I do with my stairs? Do not leave any items on the stairs. Make sure that  there are handrails on both sides of the stairs and use them. Fix handrails that are broken or loose. Make sure that handrails are as long as the stairways. Check any carpeting to make sure that it is firmly attached to the stairs. Fix any carpet that is loose or worn. Avoid having throw rugs at the top or bottom of the  stairs. If you do have throw rugs, attach them to the floor with carpet tape. Make sure that you have a light switch at the top of the stairs and the bottom of the stairs. If you do not have them, ask someone to add them for you. What else can I do to help prevent falls? Wear shoes that: Do not have high heels. Have rubber bottoms. Are comfortable and fit you well. Are closed at the toe. Do not wear sandals. If you use a stepladder: Make sure that it is fully opened. Do not climb a closed stepladder. Make sure that both sides of the stepladder are locked into place. Ask someone to hold it for you, if possible. Clearly mark and make sure that you can see: Any grab bars or handrails. First and last steps. Where the edge of each step is. Use tools that help you move around (mobility aids) if they are needed. These include: Canes. Walkers. Scooters. Crutches. Turn on the lights when you go into a dark area. Replace any light bulbs as soon as they burn out. Set up your furniture so you have a clear path. Avoid moving your furniture around. If any of your floors are uneven, fix them. If there are any pets around you, be aware of where they are. Review your medicines with your doctor. Some medicines can make you feel dizzy. This can increase your chance of falling. Ask your doctor what other things that you can do to help prevent falls. This information is not intended to replace advice given to you by your health care provider. Make sure you discuss any questions you have with your health care provider. Document Released: 07/17/2009 Document Revised: 02/26/2016 Document Reviewed: 10/25/2014 Elsevier Interactive Patient Education  2017 [employer].

## 2023-01-06 ENCOUNTER — Ambulatory Visit: Payer: Medicare Other | Attending: Cardiology | Admitting: Student

## 2023-01-06 ENCOUNTER — Encounter: Payer: Self-pay | Admitting: Student

## 2023-01-06 VITALS — BP 136/68 | HR 76 | Ht 70.0 in | Wt 194.0 lb

## 2023-01-06 DIAGNOSIS — I10 Essential (primary) hypertension: Secondary | ICD-10-CM | POA: Diagnosis not present

## 2023-01-06 DIAGNOSIS — F802 Mixed receptive-expressive language disorder: Secondary | ICD-10-CM

## 2023-01-06 DIAGNOSIS — I7121 Aneurysm of the ascending aorta, without rupture: Secondary | ICD-10-CM | POA: Diagnosis not present

## 2023-01-06 DIAGNOSIS — I4819 Other persistent atrial fibrillation: Secondary | ICD-10-CM

## 2023-01-06 DIAGNOSIS — E785 Hyperlipidemia, unspecified: Secondary | ICD-10-CM

## 2023-01-06 DIAGNOSIS — I2510 Atherosclerotic heart disease of native coronary artery without angina pectoris: Secondary | ICD-10-CM

## 2023-01-06 NOTE — Patient Instructions (Signed)
Medication Instructions:  Your physician recommends that you continue on your current medications as directed. Please refer to the Current Medication list given to you today.  *If you need a refill on your cardiac medications before your next appointment, please call your pharmacy*   Lab Work: NONE If you have labs (blood work) drawn today and your tests are completely normal, you will receive your results only by: MyChart Message (if you have MyChart) OR A paper copy in the mail If you have any lab test that is abnormal or we need to change your treatment, we will call you to review the results.   Testing/Procedures: NONE   Follow-Up: At [facility], you and your health needs are our priority.  As part of our continuing mission to provide you with exceptional heart care, we have created designated Provider Care Teams.  These Care Teams include your primary Cardiologist (physician) and Advanced Practice Providers (APPs -  Physician Assistants and Nurse Practitioners) who all work together to provide you with the care you need, when you need it.  We recommend signing up for the patient portal called "MyChart".  Sign up information is provided on this After Visit Summary.  MyChart is used to connect with patients for Virtual Visits (Telemedicine).  Patients are able to view lab/test results, encounter notes, upcoming appointments, etc.  Non-urgent messages can be sent to your provider as well.   To learn more about what you can do with MyChart, go to [email or web address].    Your next appointment:   6 month(s)  Provider:   Peter [name], MD    

## 2023-01-12 DIAGNOSIS — E119 Type 2 diabetes mellitus without complications: Secondary | ICD-10-CM | POA: Diagnosis not present

## 2023-01-24 ENCOUNTER — Ambulatory Visit (INDEPENDENT_AMBULATORY_CARE_PROVIDER_SITE_OTHER): Payer: Medicare Other

## 2023-01-24 DIAGNOSIS — I4819 Other persistent atrial fibrillation: Secondary | ICD-10-CM

## 2023-01-24 LAB — CUP PACEART REMOTE DEVICE CHECK
Date Time Interrogation Session: 20240421231535
Implantable Pulse Generator Implant Date: 20200928

## 2023-01-25 ENCOUNTER — Ambulatory Visit: Payer: Medicare Other | Admitting: Family Medicine

## 2023-01-25 ENCOUNTER — Ambulatory Visit (INDEPENDENT_AMBULATORY_CARE_PROVIDER_SITE_OTHER): Payer: Medicare Other | Admitting: Family

## 2023-01-25 ENCOUNTER — Telehealth: Payer: Self-pay

## 2023-01-25 ENCOUNTER — Other Ambulatory Visit (HOSPITAL_COMMUNITY): Payer: Self-pay | Admitting: *Deleted

## 2023-01-25 DIAGNOSIS — E1159 Type 2 diabetes mellitus with other circulatory complications: Secondary | ICD-10-CM | POA: Diagnosis not present

## 2023-01-25 DIAGNOSIS — I152 Hypertension secondary to endocrine disorders: Secondary | ICD-10-CM | POA: Diagnosis not present

## 2023-01-25 DIAGNOSIS — R634 Abnormal weight loss: Secondary | ICD-10-CM

## 2023-01-25 DIAGNOSIS — E1169 Type 2 diabetes mellitus with other specified complication: Secondary | ICD-10-CM | POA: Diagnosis not present

## 2023-01-25 DIAGNOSIS — E876 Hypokalemia: Secondary | ICD-10-CM

## 2023-01-25 DIAGNOSIS — E1142 Type 2 diabetes mellitus with diabetic polyneuropathy: Secondary | ICD-10-CM | POA: Diagnosis not present

## 2023-01-25 DIAGNOSIS — E785 Hyperlipidemia, unspecified: Secondary | ICD-10-CM | POA: Diagnosis not present

## 2023-01-25 DIAGNOSIS — Z0000 Encounter for general adult medical examination without abnormal findings: Secondary | ICD-10-CM

## 2023-01-25 LAB — BASIC METABOLIC PANEL
BUN: 15 mg/dL (ref 6–23)
CO2: 25 mEq/L (ref 19–32)
Calcium: 9.6 mg/dL (ref 8.4–10.5)
Chloride: 107 mEq/L (ref 96–112)
Creatinine, Ser: 0.82 mg/dL (ref 0.40–1.50)
GFR: 87.71 mL/min (ref >60.00)
Glucose, Bld: 112 mg/dL — ABNORMAL HIGH (ref 70–99)
Potassium: 3.5 mEq/L (ref 3.5–5.1)
Sodium: 141 mEq/L (ref 135–145)

## 2023-01-25 LAB — CBC WITH DIFFERENTIAL/PLATELET
Basophils Absolute: 0 10*3/uL (ref 0.0–0.1)
Basophils Relative: 0.9 % (ref 0.0–3.0)
Eosinophils Absolute: 0.1 10*3/uL (ref 0.0–0.7)
Eosinophils Relative: 2 % (ref 0.0–5.0)
HCT: 27.3 % — ABNORMAL LOW (ref 39.0–52.0)
Hemoglobin: 8.4 g/dL — ABNORMAL LOW (ref 13.0–17.0)
Lymphocytes Relative: 22.5 % (ref 12.0–46.0)
Lymphs Abs: 1.2 10*3/uL (ref 0.7–4.0)
MCHC: 30.6 g/dL (ref 30.0–36.0)
MCV: 80.2 fl (ref 78.0–100.0)
Monocytes Absolute: 0.8 10*3/uL (ref 0.1–1.0)
Monocytes Relative: 15.6 % — ABNORMAL HIGH (ref 3.0–12.0)
Neutro Abs: 3.1 10*3/uL (ref 1.4–7.7)
Neutrophils Relative %: 59 % (ref 43.0–77.0)
Platelets: 255 10*3/uL (ref 150.0–400.0)
RBC: 3.41 Mil/uL — ABNORMAL LOW (ref 4.22–5.81)
RDW: 19.2 % — ABNORMAL HIGH (ref 11.5–15.5)
WBC: 5.2 10*3/uL (ref 4.0–10.5)

## 2023-01-25 LAB — HEPATIC FUNCTION PANEL
ALT: 12 U/L (ref 0–53)
AST: 19 U/L (ref 0–37)
Albumin: 4.1 g/dL (ref 3.5–5.2)
Alkaline Phosphatase: 61 U/L (ref 39–117)
Bilirubin, Direct: 0.2 mg/dL (ref 0.0–0.3)
Total Bilirubin: 0.7 mg/dL (ref 0.2–1.2)
Total Protein: 7 g/dL (ref 6.0–8.3)

## 2023-01-25 LAB — POCT GLYCOSYLATED HEMOGLOBIN (HGB A1C): Hemoglobin A1C: 5.3 % (ref 4.0–5.6)

## 2023-01-25 LAB — LIPID PANEL
Cholesterol: 92 mg/dL (ref 0–200)
HDL: 38.5 mg/dL — ABNORMAL LOW (ref >39.00)
LDL Cholesterol: 31 mg/dL (ref 0–99)
NonHDL: 53.6
Total CHOL/HDL Ratio: 2
Triglycerides: 115 mg/dL (ref 0.0–149.0)
VLDL: 23 mg/dL (ref 0.0–40.0)

## 2023-01-25 LAB — TSH: TSH: 2.13 u[IU]/mL (ref 0.35–5.50)

## 2023-01-25 MED ORDER — METOPROLOL TARTRATE 50 MG PO TABS: ORAL_TABLET | ORAL | 5 refills | Status: DC

## 2023-01-25 MED ORDER — METOPROLOL TARTRATE 50 MG PO TABS: ORAL_TABLET | ORAL | 1 refills | Status: DC

## 2023-01-25 MED ORDER — LISINOPRIL 10 MG PO TABS: 10.0000 mg | ORAL_TABLET | Freq: Every day | ORAL | 3 refills | Status: DC

## 2023-01-25 NOTE — Telephone Encounter (Addendum)
Outreach made to Pt.  Spoke with wife.  Advised loop recorder RRT.  Discussed options.  Wife states she will discuss with Pt and return call to device clinic to advise.  Direct number given.  ILR reached RRT:  01/23/2023  Marked "I" in Paceart: done Enter note in Paceart: done Canceled future remotes: done Discontinued from website: done Entered in Speciality Comments: done  Advised if further questions arise to please call the device clinic at [phone].

## 2023-01-26 ENCOUNTER — Ambulatory Visit (HOSPITAL_COMMUNITY)
Admission: RE | Admit: 2023-01-26 | Discharge: 2023-01-26 | Disposition: A | Discharge status: Home / Self Care | Payer: Medicare Other | Source: Ambulatory Visit | Attending: Physician Assistant | Admitting: Physician Assistant

## 2023-01-26 ENCOUNTER — Encounter (HOSPITAL_COMMUNITY): Payer: Self-pay | Admitting: Physician Assistant

## 2023-01-26 ENCOUNTER — Other Ambulatory Visit: Payer: Self-pay | Admitting: Family

## 2023-01-26 VITALS — BP 98/58 | HR 71 | Ht 70.0 in | Wt 184.2 lb

## 2023-01-26 DIAGNOSIS — Z5181 Encounter for therapeutic drug level monitoring: Secondary | ICD-10-CM | POA: Diagnosis not present

## 2023-01-26 DIAGNOSIS — D649 Anemia, unspecified: Secondary | ICD-10-CM | POA: Insufficient documentation

## 2023-01-26 DIAGNOSIS — G4733 Obstructive sleep apnea (adult) (pediatric): Secondary | ICD-10-CM | POA: Diagnosis not present

## 2023-01-26 DIAGNOSIS — E512 Wernicke's encephalopathy: Secondary | ICD-10-CM | POA: Insufficient documentation

## 2023-01-26 DIAGNOSIS — I10 Essential (primary) hypertension: Secondary | ICD-10-CM | POA: Insufficient documentation

## 2023-01-26 DIAGNOSIS — I4819 Other persistent atrial fibrillation: Secondary | ICD-10-CM | POA: Diagnosis not present

## 2023-01-26 DIAGNOSIS — D6869 Other thrombophilia: Secondary | ICD-10-CM

## 2023-01-26 DIAGNOSIS — I2510 Atherosclerotic heart disease of native coronary artery without angina pectoris: Secondary | ICD-10-CM | POA: Insufficient documentation

## 2023-01-26 DIAGNOSIS — I719 Aortic aneurysm of unspecified site, without rupture: Secondary | ICD-10-CM | POA: Diagnosis not present

## 2023-01-26 DIAGNOSIS — Z87891 Personal history of nicotine dependence: Secondary | ICD-10-CM | POA: Insufficient documentation

## 2023-01-26 DIAGNOSIS — Z79899 Other long term (current) drug therapy: Secondary | ICD-10-CM | POA: Diagnosis not present

## 2023-01-26 DIAGNOSIS — D500 Iron deficiency anemia secondary to blood loss (chronic): Secondary | ICD-10-CM

## 2023-01-26 NOTE — Progress Notes (Signed)
Established Patient Office Visit  Subjective   Patient ID: Adam [name], male    DOB: 1951/01/21  Age: 72 y.o. MRN: [id number]  No chief complaint on file.   HPI 72 year old male, patient of Dr.[name] presents for a recheck of HTN, CAD, A-fib, Alcohol abuse,COPD, TYpe 2 DM, Werncke encephalopathy, and hupokalemia. He has lost over 100 lbs due to his inability to swallow hard/tough foods. He eats a lot of soft foods and berries according to his wife. They have been supplementing with Ensure that has helped. He feels good. Has a cardiology appt tomorrow. Glucose has been well controlled.    Review of Systems  Constitutional:  Positive for weight loss.  All other systems reviewed and are negative.  Past Medical History:  Diagnosis Date   Alcohol withdrawal 10/24/2019   Allergy    Anxiety    history of PTSD following CABG   Ascending aortic aneurysm 01/31/2018   43 x 42 mm, pt unaware   Asthma    Cardiomegaly 10/17/2017   Colitis- colonoscopy 2014 07/13/2015   COPD (chronic obstructive pulmonary disease)    Coronary artery disease    x 6   Depression    Diabetes mellitus without complication    Family history of polyps in the colon    Finger dislocation    Left pinkie   GERD (gastroesophageal reflux disease)    Gout    H/O atrial fibrillation without current medication    following CABG with no documented episodes since then.   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    Social History   Socioeconomic History   Marital status: Married    Spouse name: Not on file   Number of children: 0   Years of education: Not on file   Highest education level: Not on file   Occupational History   Occupation: retired  Tobacco Use   Smoking status: Former    Packs/day: 1.00    Years: 7.00    Additional pack years: 0.00    Total pack years: 7.00    Types: Cigarettes    Quit date: 10/04/1977    Years since quitting: 45.3   Smokeless tobacco: Never   Tobacco comments:    Former smoker 01/26/23  Vaping Use   Vaping Use: Never used  Substance and Sexual Activity   Alcohol use: Yes    Alcohol/week: 3.0 - 4.0 standard drinks of alcohol    Types: 1 Glasses of wine, 2 - 3 Shots of liquor per week    Comment: 4 drinks daily 01/26/23   Drug use: No    Comment: Smoked Marijuana back in 1970s   Sexual activity: Yes  Other Topics Concern   Not on file  Social History Narrative   Married, no children   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 - returned to [employer] from [place] 2014   Enjoys bird watching, reading historical books about WWII, watching tv   Social Determinants of Health   Financial Resource Strain: Low Risk  (01/05/2023)   Overall Financial Resource Strain (CARDIA)  Difficulty of Paying Living Expenses: Not hard at all  Food Insecurity: No Food Insecurity (01/05/2023)   Hunger Vital Sign    Worried About Running Out of Food in the Last Year: Never true    Ran Out of Food in the Last Year: Never true  Transportation Needs: No Transportation Needs (01/05/2023)   PRAPARE - Administrator, Civil Service (Medical): No    Lack of Transportation (Non-Medical): No  Physical Activity: Inactive (01/05/2023)   Exercise Vital Sign    Days of Exercise per Week: 0 days    Minutes of Exercise per Session: 0 min  Stress: No Stress Concern Present (01/05/2023)   [employer] of Occupational Health - Occupational Stress Questionnaire    Feeling of Stress : Not at all  Social Connections: Moderately Isolated (01/05/2023)   Social Connection and Isolation Panel [NHANES]    Frequency of Communication with Friends and  Family: More than three times a week    Frequency of Social Gatherings with Friends and Family: More than three times a week    Attends Religious Services: Never    Database administrator or Organizations: No    Attends Banker Meetings: Never    Marital Status: Married  Catering manager Violence: Not At Risk (01/05/2023)   Humiliation, Afraid, Rape, and Kick questionnaire    Fear of Current or Ex-Partner: No    Emotionally Abused: No    Physically Abused: No    Sexually Abused: No    Past Surgical History:  Procedure Laterality Date   ANKLE FRACTURE SURGERY Right 1991   APPENDECTOMY     ATRIAL FIBRILLATION ABLATION N/A 03/31/2018   Procedure: ATRIAL FIBRILLATION ABLATION;  Surgeon: Hillis Range, MD;  Location: [facility]IVE [facility];  Service: Cardiovascular;  Laterality: N/A;   CARDIAC ELECTROPHYSIOLOGY MAPPING AND ABLATION     CARDIOVERSION N/A 03/23/2021   Procedure: CARDIOVERSION;  Surgeon: Elease Hashimoto Deloris Ping, MD;  Location: [facility] ENDOSCOPY;  Service: Cardiovascular;  Laterality: N/A;   COLONOSCOPY  06/11/2021   COLONOSCOPY W/ BIOPSIES  2017   x7   CORONARY ANGIOPLASTY WITH STENT PLACEMENT     CORONARY ARTERY BYPASS GRAFT     FINGER SURGERY  04/2018   Small finger left hand   implantable loop recorder placement  07/02/2019   Medtronic Reveal [place] model LNQ11 (SN [id number]) implanted in office by Dr Johney Frame   TEE WITHOUT CARDIOVERSION N/A 03/30/2018   Procedure: TRANSESOPHAGEAL ECHOCARDIOGRAM (TEE);  Surgeon: Thurmon Fair, MD;  Location: [facility] ENDOSCOPY;  Service: Cardiovascular;  Laterality: N/A;   TEE WITHOUT CARDIOVERSION N/A 03/23/2021   Procedure: TRANSESOPHAGEAL ECHOCARDIOGRAM (TEE);  Surgeon: Elease Hashimoto Deloris Ping, MD;  Location: [facility] ENDOSCOPY;  Service: Cardiovascular;  Laterality: N/A;   TOTAL HIP ARTHROPLASTY Left    TOTAL HIP ARTHROPLASTY Right 09/12/2018   Procedure: TOTAL HIP ARTHROPLASTY ANTERIOR APPROACH;  Surgeon: Durene Romans, MD;  Location: [facility] ORS;  Service:  Orthopedics;  Laterality: Right;  70 mins    Family History  Problem Relation Age of Onset   Colon cancer Mother    Cancer Father        UNKNOWN TYPE   Heart disease Paternal Grandmother    Heart disease Paternal Grandfather    Esophageal cancer Neg Hx    Rectal cancer Neg Hx    Stomach cancer Neg Hx     Allergies  Allergen Reactions   Amoxicillin Rash and Other (See Comments)    RASH IN GROIN AREA Has patient had a  PCN reaction causing immediate rash, facial/tongue/throat swelling, SOB or lightheadedness with hypotension: no Has patient had a PCN reaction causing severe rash involving mucus membranes or skin necrosis: no Has patient had a PCN reaction that required hospitalization: no Has patient had a PCN reaction occurring within the last 10 years: yes If all of the above answers are "NO", then may proceed with Cephalosporin use.    Augmentin [Amoxicillin-Pot Clavulanate] Rash and Other (See Comments)    RASH IN GROIN AREA   Azithromycin Rash and Other (See Comments)    RASH IN GROIN AREA   Clindamycin/Lincomycin Rash   Keflex [Cephalexin] Rash   Xarelto [Rivaroxaban] Other (See Comments)    Patient stated he "ended up in the hospital with a rectal bleed"    Current Outpatient Medications on File Prior to Visit  Medication Sig Dispense Refill   allopurinol (ZYLOPRIM) 300 MG tablet TAKE 1 TABLET(300 MG) BY MOUTH DAILY 90 tablet 0   amiodarone (PACERONE) 200 MG tablet TAKE 1 TABLET(200 MG) BY MOUTH DAILY 90 tablet 1   apixaban (ELIQUIS) 5 MG TABS tablet Take 1 tablet (5 mg total) by mouth 2 (two) times daily. 180 tablet 3   B COMPLEX-C-FOLIC ACID PO Take 1 tablet by mouth daily.     Continuous Blood Gluc Sensor (DEXCOM G7 SENSOR) MISC by Does not apply route. Every 10 days     DULoxetine (CYMBALTA) 60 MG capsule Take one capsule by mouth once daily 90 capsule 3   gabapentin (NEURONTIN) 300 MG capsule TAKE 2 CAPSULES BY MOUTH AT NIGHT AS NEEDED FOR RESTLESS LEGS OR SYMPTOMS  180 capsule 0   HM LIDOCAINE PATCH EX Apply 1 patch topically as needed (pain).     Insulin Pen Needle (NOVOFINE PEN NEEDLE) 32G X 6 MM MISC Use 1-4 times daily as needed for insulin 100 each 3   Magnesium Oxide 400 MG CAPS Take 1 capsule (400 mg total) by mouth daily. 90 capsule 1   montelukast (SINGULAIR) 10 MG tablet Take 1 tablet (10 mg total) by mouth at bedtime. 90 tablet 0   rosuvastatin (CRESTOR) 40 MG tablet TAKE 1 TABLET(40 MG) BY MOUTH DAILY 90 tablet 3   SKYRIZI PEN 150 MG/ML SOAJ Inject into the skin. Takes every 3 months     sodium chloride (OCEAN) 0.65 % SOLN nasal spray Place 1 spray into both nostrils as needed for congestion.     Spacer/Aero-Holding Chambers DEVI Use with inhaler 1 each 1   SYNJARDY XR 25-1000 MG TB24 TAKE 1 TABLET BY MOUTH DAILY 90 tablet 0   thiamine (VITAMIN B-1) 100 MG tablet Take 500 mg by mouth daily.     TOUJEO SOLOSTAR 300 UNIT/ML Solostar Pen INJECT 60 UNITS INTO THE SKIN EVERY DAY (Patient taking differently: Inject 52 Units into the skin daily.) 33 mL 3   No current facility-administered medications on file prior to visit.    There were no vitals taken for this visit.chart    Objective:     There were no vitals taken for this visit.   Physical Exam Vitals and nursing note reviewed.  Constitutional:      Appearance: Normal appearance. He is normal weight.  HENT:     Head: Normocephalic and atraumatic.     Right Ear: Tympanic membrane and ear canal normal.     Left Ear: Tympanic membrane and ear canal normal.     Mouth/Throat:     Pharynx: Oropharynx is clear.  Cardiovascular:     Rate  and Rhythm: Normal rate and regular rhythm.  Pulmonary:     Effort: Pulmonary effort is normal.     Breath sounds: Normal breath sounds.  Abdominal:     General: Abdomen is flat.  Musculoskeletal:        General: Normal range of motion.     Cervical back: Normal range of motion.  Skin:    General: Skin is warm and dry.  Neurological:      General: No focal deficit present.     Mental Status: He is alert and oriented to person, place, and time.  Psychiatric:        Mood and Affect: Mood normal.      Results for orders placed or performed in visit on 01/25/23  Basic Metabolic Panel  Result Value Ref Range   Sodium 141 135 - 145 mEq/L   Potassium 3.5 3.5 - 5.1 mEq/L   Chloride 107 96 - 112 mEq/L   CO2 25 19 - 32 mEq/L   Glucose, Bld 112 (H) 70 - 99 mg/dL   BUN 15 6 - 23 mg/dL   Creatinine, Ser 1.61 0.40 - 1.50 mg/dL   GFR 09.60 >45.40 mL/min   Calcium 9.6 8.4 - 10.5 mg/dL  Hepatic Function Panel  Result Value Ref Range   Total Bilirubin 0.7 0.2 - 1.2 mg/dL   Bilirubin, Direct 0.2 0.0 - 0.3 mg/dL   Alkaline Phosphatase 61 39 - 117 U/L   AST 19 0 - 37 U/L   ALT 12 0 - 53 U/L   Total Protein 7.0 6.0 - 8.3 g/dL   Albumin 4.1 3.5 - 5.2 g/dL  CBC with Differential/Platelets  Result Value Ref Range   WBC 5.2 4.0 - 10.5 K/uL   RBC 3.41 (L) 4.22 - 5.81 Mil/uL   Hemoglobin 8.4 Repeated and verified X2. (L) 13.0 - 17.0 g/dL   HCT 98.1 (L) 19.1 - 47.8 %   MCV 80.2 78.0 - 100.0 fl   MCHC 30.6 30.0 - 36.0 g/dL   RDW 29.5 (H) 62.1 - 30.8 %   Platelets 255.0 150.0 - 400.0 K/uL   Neutrophils Relative % 59.0 43.0 - 77.0 %   Lymphocytes Relative 22.5 12.0 - 46.0 %   Monocytes Relative 15.6 (H) 3.0 - 12.0 %   Eosinophils Relative 2.0 0.0 - 5.0 %   Basophils Relative 0.9 0.0 - 3.0 %   Neutro Abs 3.1 1.4 - 7.7 K/uL   Lymphs Abs 1.2 0.7 - 4.0 K/uL   Monocytes Absolute 0.8 0.1 - 1.0 K/uL   Eosinophils Absolute 0.1 0.0 - 0.7 K/uL   Basophils Absolute 0.0 0.0 - 0.1 K/uL  TSH  Result Value Ref Range   TSH 2.13 0.35 - 5.50 uIU/mL  Lipid panel  Result Value Ref Range   Cholesterol 92 0 - 200 mg/dL   Triglycerides 657.8 0.0 - 149.0 mg/dL   HDL 46.96 (L) >29.52 mg/dL   VLDL 84.1 0.0 - 32.4 mg/dL   LDL Cholesterol 31 0 - 99 mg/dL   Total CHOL/HDL Ratio 2    NonHDL 53.60   POC HgB A1c  Result Value Ref Range   Hemoglobin  A1C 5.3 4.0 - 5.6 %   HbA1c POC (<> result, manual entry)     HbA1c, POC (prediabetic range)     HbA1c, POC (controlled diabetic range)        The ASCVD Risk score (Arnett DK, et al., 2019) failed to calculate for the following reasons:   The valid total  cholesterol range is 130 to 320 mg/dL    Assessment & Plan:   Problem List Items Addressed This Visit     Hypertension associated with diabetes (HCC) (Chronic)   Relevant Medications   metoprolol tartrate (LOPRESSOR) 50 MG tablet   lisinopril (ZESTRIL) 10 MG tablet   Other Relevant Orders   Basic Metabolic Panel (Completed)   Hepatic Function Panel (Completed)   CBC with Differential/Platelets (Completed)   TSH (Completed)   Type 2 diabetes mellitus, controlled   Relevant Medications   lisinopril (ZESTRIL) 10 MG tablet   Other Relevant Orders   POC HgB A1c (Completed)   Basic Metabolic Panel (Completed)   Hepatic Function Panel (Completed)   CBC with Differential/Platelets (Completed)   TSH (Completed)   Hypokalemia   Relevant Orders   Basic Metabolic Panel (Completed)   TSH (Completed)   Hyperlipidemia associated with type 2 diabetes mellitus   Relevant Medications   metoprolol tartrate (LOPRESSOR) 50 MG tablet   lisinopril (ZESTRIL) 10 MG tablet   Other Relevant Orders   Basic Metabolic Panel (Completed)   Hepatic Function Panel (Completed)   TSH (Completed)   Lipid panel (Completed)   Other Visit Diagnoses     Encounter for Medicare annual wellness exam    -  Primary   Relevant Orders   CBC with Differential/Platelets (Completed)   TSH (Completed)   Weight loss       Relevant Orders   TSH (Completed)     Although his weight is decreasing, he is actually at a normal weight for his height. We will continue to monitor and consider intervention if necessary to help increase appetite. A1c is normal. Labs obtained will notify patient pending results. Encouraged a balance diet.   Return in about 4 months  (around 05/27/2023).    Eulis Foster, [name]

## 2023-01-26 NOTE — Progress Notes (Signed)
[facility] Summary Report / Loop Recorder 

## 2023-01-26 NOTE — Progress Notes (Signed)
Primary Care Physician: [name], MD Referring Physician: Dr. Rayann Heman Primary Cardiologist: Dr [place]  Adam [name] is a 72 y.o. male with a h/o  CABG in 2011, done in [place], HTN, HLD, DM, Atrial fibrillation,alcohol use, and OSA. He underwent catheter ablation for atrial fibrillation by Dr. Rayann Heman 03/31/18. He was followed by Dr. Radford Pax in sleep clinic, but unable to tolerate CPAP and turned in his equipment. He was previously on Xarelto for a CHADS2VASC score of 3. Patient was admitted 10/24/19 for GI bleeding and alcohol withdrawal. EP was consulted to assist with dofetilide reloading. His Xarelto was held on admission and was not resumed at discharge, he was eventually placed back on eliquis.  He was discharged to rehab for a prolonged period of time for Wernicke's encephalopathy.   He is now back in the afib clinic for pt feeling rapid heart rate.. By his device, he had had 3 episodes of afib.Fastest rate was 146 bpm, with longest duration of 44 mins. Recent burden was actually improved at 9.4%, previously at 14%. Presenting rhythm for the interpretation was in rapid a  Flutter and he continues in this rhythm today. He continues o drink alcohol, when asked the amount, he states "enough". He continues to have untreated OSA.   F/u in afib clinic, 03/31/21. He had a hospitalization for afib with RVR, 6-17 thru 6-21, worsened with not taking cardizem for 3 days as the drug store was out of it per pt. He was having ongoing alcohol abuse. Last alcohol was that am, wine with breakfast. He had some slurred speech, left facial droop that had occurred days earlier, PCP obtained MRI, negative for stroke.EP was consulted doe RVR, cardizem drip started and the decision was made to stop Tikosyn and start amio load. Pt was on amio in  the remote past, had issues with skin discoloration but EP did not see any alternative drug options. Hospitalization was complicated by alcohol withdrawal.   In the office  today, afib is rate controlled. He continues on amiodarone 400 mg bid, x 2 weeks. He states he has missed a few doses of DOAC, reminded not to do this with pending cardioversion. He feels improved.   F/u in afib clinic 04/21/21 after an ER visit for palpitations and found to be in afib. He had missed several dose of amiodarone and eliquis the week before. Planned for reloading of amiodarone and sent home. He is in SR today. He has been on 200 mg bid for the last 2 weeks. He will lower to 200 mg daily now. His is describing some constipation with amiodarone.   F/u in afib clinic, 07/30/21 for surveillance of amiodarone. He remains in SR but with a prolonged qtc at 592 ms. He denies any new drugs or benadryl use. His wife states that he intake of alcohol has worsened. He has fallen numerous times and his speech is slurred today. This has been getting worse over several  weeks time. He saw his PCP last week, had same symptoms and was  referred to neurology, appointment next week. He states he drinks 3-4 beverage glass size mixed  scotch drinks a day. He has not noted any afib.   F/u in afib clinic, 01/27/22. He is in the clinic for amiodarone surveillance. He indicates that he has not noted any afib. He is in SR today. The biggest change I see today is that his speech is very garbled, only being able to make out a few words. His  wife states that it is progression of  Wernicke's encephalopathy. He continues  with abuse alcohol. He has occasional falls. He is requesting imaging of his aortic aneurysm be done. It has been almost 2 years insce last CT. Dr. Mayford Knife ordered it  last but he no longer sees her as he turned in his CPAP and does not treat his afib.    F/u in afib clinic, 07/29/22. He is almost non verbal now from  the consequences of his alcoholism and  Wernicke's encephalopathy. He continues  on amiodarone and EKG shows SR. He has not had any afib that he is aware of. He is here with his wife and is very  alert, answers questions with non verbal hand/facial movements.      Follow up in the AF clinic 01/26/23. Patient reports that he has done well from an afib standpoint. He has not had any afib symptoms and his ILR showed 0% afib burden. He has been more fatigued recently. He denies any bleeding issues on anticoagulation, no black stools.   Today, he denies symptoms of palpitations, chest pain, mild increase of shortness of breath and fatigue,  orthopnea, PND, lower extremity edema, dizziness, presyncope, syncope, or neurologic sequela. The patient is tolerating medications without difficulties and is otherwise without complaint today.   Past Medical History:  Diagnosis Date   Alcohol withdrawal 10/24/2019   Allergy    Anxiety    history of PTSD following CABG   Ascending aortic aneurysm 01/31/2018   43 x 42 mm, pt unaware   Asthma    Cardiomegaly 10/17/2017   Colitis- colonoscopy 2014 07/13/2015   COPD (chronic obstructive pulmonary disease)    Coronary artery disease    x 6   Depression    Diabetes mellitus without complication    Family history of polyps in the colon    Finger dislocation    Left pinkie   GERD (gastroesophageal reflux disease)    Gout    H/O atrial fibrillation without current medication    following CABG with no documented episodes since then.   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   Past Surgical History:  Procedure Laterality Date   ANKLE FRACTURE SURGERY Right 1991   APPENDECTOMY     ATRIAL FIBRILLATION ABLATION N/A 03/31/2018   Procedure: ATRIAL FIBRILLATION ABLATION;  Surgeon: Hillis Range, MD;  Location: [facility]ASIVE [facility];  Service: Cardiovascular;   Laterality: N/A;   CARDIAC ELECTROPHYSIOLOGY MAPPING AND ABLATION     CARDIOVERSION N/A 03/23/2021   Procedure: CARDIOVERSION;  Surgeon: Elease Hashimoto Deloris Ping, MD;  Location: [facility] ENDOSCOPY;  Service: Cardiovascular;  Laterality: N/A;   COLONOSCOPY  06/11/2021   COLONOSCOPY W/ BIOPSIES  2017   x7   CORONARY ANGIOPLASTY WITH STENT PLACEMENT     CORONARY ARTERY BYPASS GRAFT     FINGER SURGERY  04/2018   Small finger left hand   implantable loop recorder placement  07/02/2019   Medtronic Reveal Monroe model LNQ11 (SN [id number]) implanted in office by Dr Johney Frame   TEE WITHOUT CARDIOVERSION N/A 03/30/2018   Procedure: TRANSESOPHAGEAL ECHOCARDIOGRAM (TEE);  Surgeon: Thurmon Fair, MD;  Location: [facility] ENDOSCOPY;  Service: Cardiovascular;  Laterality: N/A;   TEE WITHOUT CARDIOVERSION N/A 03/23/2021   Procedure: TRANSESOPHAGEAL ECHOCARDIOGRAM (TEE);  Surgeon: Elease Hashimoto Deloris Ping, MD;  Location: [facility] ENDOSCOPY;  Service: Cardiovascular;  Laterality: N/A;   TOTAL HIP ARTHROPLASTY Left    TOTAL HIP ARTHROPLASTY Right 09/12/2018   Procedure: TOTAL HIP ARTHROPLASTY ANTERIOR APPROACH;  Surgeon: Durene Romans, MD;  Location: [facility] ORS;  Service: Orthopedics;  Laterality: Right;  70 mins    Current Outpatient Medications  Medication Sig Dispense Refill   allopurinol (ZYLOPRIM) 300 MG tablet TAKE 1 TABLET(300 MG) BY MOUTH DAILY 90 tablet 0   amiodarone (PACERONE) 200 MG tablet TAKE 1 TABLET(200 MG) BY MOUTH DAILY 90 tablet 1   apixaban (ELIQUIS) 5 MG TABS tablet Take 1 tablet (5 mg total) by mouth 2 (two) times daily. 180 tablet 3   B COMPLEX-C-FOLIC ACID PO Take 1 tablet by mouth daily.     Continuous Blood Gluc Sensor (DEXCOM G7 SENSOR) MISC by Does not apply route. Every 10 days     DULoxetine (CYMBALTA) 60 MG capsule Take one capsule by mouth once daily 90 capsule 3   gabapentin (NEURONTIN) 300 MG capsule TAKE 2 CAPSULES BY MOUTH AT NIGHT AS NEEDED FOR RESTLESS LEGS OR SYMPTOMS 180 capsule 0   HM LIDOCAINE PATCH EX  Apply 1 patch topically as needed (pain).     Insulin Pen Needle (NOVOFINE PEN NEEDLE) 32G X 6 MM MISC Use 1-4 times daily as needed for insulin 100 each 3   lisinopril (ZESTRIL) 10 MG tablet Take 1 tablet (10 mg total) by mouth daily. 90 tablet 3   Magnesium Oxide 400 MG CAPS Take 1 capsule (400 mg total) by mouth daily. 90 capsule 1   metoprolol tartrate (LOPRESSOR) 50 MG tablet TAKE 1 TABLET(50 MG) BY MOUTH TWICE DAILY 180 tablet 1   montelukast (SINGULAIR) 10 MG tablet Take 1 tablet (10 mg total) by mouth at bedtime. 90 tablet 0   rosuvastatin (CRESTOR) 40 MG tablet TAKE 1 TABLET(40 MG) BY MOUTH DAILY 90 tablet 3   SKYRIZI PEN 150 MG/ML SOAJ Inject into the skin. Takes every 3 months     sodium chloride (OCEAN) 0.65 % SOLN nasal spray Place 1 spray into both nostrils as needed for congestion.     Spacer/Aero-Holding Chambers DEVI Use with inhaler 1 each 1   SYNJARDY XR 25-1000 MG TB24 TAKE 1 TABLET BY MOUTH DAILY 90 tablet 0   thiamine (VITAMIN B-1) 100 MG tablet Take 500 mg by mouth daily.     TOUJEO SOLOSTAR 300 UNIT/ML Solostar Pen INJECT 60 UNITS INTO THE SKIN EVERY DAY (Patient taking differently: Inject 52 Units into the skin daily.) 33 mL 3   No current facility-administered medications for this encounter.    Allergies  Allergen Reactions   Amoxicillin Rash and Other (See Comments)    RASH IN GROIN AREA Has patient had a PCN reaction causing immediate rash, facial/tongue/throat swelling, SOB or lightheadedness with hypotension: no Has patient had a PCN reaction causing severe rash involving mucus membranes or skin necrosis: no Has patient had a PCN reaction that required hospitalization: no Has patient had a PCN reaction occurring within the last 10 years: yes If all of the above answers are "NO", then may proceed with Cephalosporin use.    Augmentin [Amoxicillin-Pot Clavulanate] Rash and Other (See Comments)    RASH IN GROIN AREA   Azithromycin Rash and Other (See Comments)     RASH IN GROIN AREA   Clindamycin/Lincomycin  Rash   Keflex [Cephalexin] Rash   Xarelto [Rivaroxaban] Other (See Comments)    Patient stated he "ended up in the hospital with a rectal bleed"    Social History   Socioeconomic History   Marital status: Married    Spouse name: Not on file   Number of children: 0   Years of education: Not on file   Highest education level: Not on file  Occupational History   Occupation: retired  Tobacco Use   Smoking status: Former    Packs/day: 1.00    Years: 7.00    Additional pack years: 0.00    Total pack years: 7.00    Types: Cigarettes    Quit date: 10/04/1977    Years since quitting: 45.3   Smokeless tobacco: Never   Tobacco comments:    Former smoker 01/26/23  Vaping Use   Vaping Use: Never used  Substance and Sexual Activity   Alcohol use: Yes    Alcohol/week: 3.0 - 4.0 standard drinks of alcohol    Types: 1 Glasses of wine, 2 - 3 Shots of liquor per week    Comment: 4 drinks daily 01/26/23   Drug use: No    Comment: Smoked Marijuana back in 1970s   Sexual activity: Yes  Other Topics Concern   Not on file  Social History Narrative   Married, no children   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 - returned to [employer] from [place] 2014   Enjoys bird watching, reading historical books about WWII, watching tv   Social Determinants of Health   Financial Resource Strain: Low Risk  (01/05/2023)   Overall Financial Resource Strain (CARDIA)    Difficulty of Paying Living Expenses: Not hard at all  Food Insecurity: No Food Insecurity (01/05/2023)   Hunger Vital Sign    Worried About Running Out of Food in the Last Year: Never true    Ran Out of Food in the Last Year: Never true  Transportation Needs: No Transportation Needs (01/05/2023)   PRAPARE - Administrator, Civil Service (Medical): No    Lack of Transportation (Non-Medical): No  Physical Activity: Inactive (01/05/2023)   Exercise Vital  Sign    Days of Exercise per Week: 0 days    Minutes of Exercise per Session: 0 min  Stress: No Stress Concern Present (01/05/2023)   [employer] of Occupational Health - Occupational Stress Questionnaire    Feeling of Stress : Not at all  Social Connections: Moderately Isolated (01/05/2023)   Social Connection and Isolation Panel [NHANES]    Frequency of Communication with Friends and Family: More than three times a week    Frequency of Social Gatherings with Friends and Family: More than three times a week    Attends Religious Services: Never    Database administrator or Organizations: No    Attends Banker Meetings: Never    Marital Status: Married  Catering manager Violence: Not At Risk (01/05/2023)   Humiliation, Afraid, Rape, and Kick questionnaire    Fear of Current or Ex-Partner: No    Emotionally Abused: No    Physically Abused: No    Sexually Abused: No    Family History  Problem Relation Age of Onset   Colon cancer Mother    Cancer Father        UNKNOWN TYPE   Heart disease Paternal Grandmother    Heart disease Paternal Grandfather  Esophageal cancer Neg Hx    Rectal cancer Neg Hx    Stomach cancer Neg Hx     ROS- All systems are reviewed and negative except as per the HPI above  Physical Exam: Vitals:   01/26/23 1031  BP: (!) 98/58  Pulse: 71  Weight: 83.6 kg  Height:  (1.778 m)    Wt Readings from Last 3 Encounters:  01/26/23 83.6 kg  01/06/23 88 kg  01/05/23 88.1 kg    Labs: Lab Results  Component Value Date   NA 141 01/25/2023   K 3.5 01/25/2023   CL 107 01/25/2023   CO2 25 01/25/2023   GLUCOSE 112 (H) 01/25/2023   BUN 15 01/25/2023   CREATININE 0.82 01/25/2023   CALCIUM 9.6 01/25/2023   PHOS 3.2 03/20/2021   MG 2.0 08/02/2021   Lab Results  Component Value Date   INR 1.2 03/23/2021   Lab Results  Component Value Date   CHOL 92 01/25/2023   HDL 38.50 (L) 01/25/2023   LDLCALC 31 01/25/2023   TRIG 115.0  01/25/2023    GEN- The patient is a well appearing male, alert and oriented x 3 today.   HEENT-head normocephalic, atraumatic, sclera clear, conjunctiva pink, hearing intact, trachea midline. Lungs- Clear to ausculation bilaterally, normal work of breathing Heart- Regular rate and rhythm, no murmurs, rubs or gallops  GI- soft, NT, ND, + BS Extremities- no clubbing, cyanosis, or edema MS- no significant deformity or atrophy Skin- no rash or lesion Psych- euthymic mood, full affect Neuro- strength and sensation are intact   ECG today demonstrates SR Vent. rate 71 BPM PR interval 192 ms QRS duration 100 ms QT/QTcB 456/495 ms   Epic records reviewed    CHA2DS2-VASc Score = 4  The patient's score is based upon: CHF History: 0 HTN History: 1 Diabetes History: 1 Stroke History: 0 Vascular Disease History: 1 Age Score: 1 Gender Score: 0       ASSESSMENT AND PLAN: 1. Persistent Atrial Fibrillation (ICD10:  I48.19) The patient's CHA2DS2-VASc score is 4, indicating a 4.8% annual risk of stroke.   S/p ablation 03/2018. Tikosyn stopped and amiodarone was started 03/31/21 Patient maintaining SR.  ILR now at RRT, patient wants to leave the device implanted.  Continue amiodarone 200 mg daily Continue Eliquis 5 mg BID Recent labs reviewed  Chest CT fall 2023- no acute findings for ongoing amiodarone use  Continue metoprolol tartrate 50 mg BID  2. Secondary Hypercoagulable State (ICD10:  D68.69) The patient is at significant risk for stroke/thromboembolism based upon his CHA2DS2-VASc Score of 4.  Continue Apixaban (Eliquis).   3. OSA Not on CPAP  4. CAD No anginal symptoms.  5. HTN Stable, no changes today.  5. Wernicke's encephalopathy Aphasic  Communicates with gestures and writing.   7. Aortic aneurysm  CT of the chest done 06/2022 Stable and will need to be repeated in 3 years per Dr. [place]   8. Anemia Hgb 8.4 on lab work from PCP, was 13 two months  ago Suspect this is contributing to his fatigue.  No frank bleeding, no black tarry stools. Patient plans to call PCP today for evaluation.     Follow up in the AF clinic in 6 months.    Jorja Loa PA-C [facility] [address] [place], [place] [address] [phone]

## 2023-01-27 NOTE — Telephone Encounter (Signed)
No action needed.  Pt monitoring discontinued.

## 2023-01-27 NOTE — Telephone Encounter (Signed)
I spoke with the patient wife and he decided to leave the loop in and not worry about it. I told her I will send a phone note to let the nurse know.

## 2023-02-01 ENCOUNTER — Telehealth: Payer: Self-pay | Admitting: Family Medicine

## 2023-02-01 ENCOUNTER — Other Ambulatory Visit (INDEPENDENT_AMBULATORY_CARE_PROVIDER_SITE_OTHER): Payer: Medicare Other

## 2023-02-01 ENCOUNTER — Telehealth: Payer: Self-pay | Admitting: *Deleted

## 2023-02-01 ENCOUNTER — Other Ambulatory Visit: Payer: Self-pay | Admitting: Family

## 2023-02-01 DIAGNOSIS — R634 Abnormal weight loss: Secondary | ICD-10-CM

## 2023-02-01 DIAGNOSIS — D500 Iron deficiency anemia secondary to blood loss (chronic): Secondary | ICD-10-CM

## 2023-02-01 LAB — CBC WITH DIFFERENTIAL/PLATELET
Basophils Absolute: 0 10*3/uL (ref 0.0–0.1)
Basophils Relative: 0.5 % (ref 0.0–3.0)
Eosinophils Absolute: 0.2 10*3/uL (ref 0.0–0.7)
Eosinophils Relative: 3 % (ref 0.0–5.0)
HCT: 26.6 % — ABNORMAL LOW (ref 39.0–52.0)
Hemoglobin: 8 g/dL — CL (ref 13.0–17.0)
Lymphocytes Relative: 22.1 % (ref 12.0–46.0)
Lymphs Abs: 1.1 10*3/uL (ref 0.7–4.0)
MCHC: 29.9 g/dL — ABNORMAL LOW (ref 30.0–36.0)
MCV: 80.7 fl (ref 78.0–100.0)
Monocytes Absolute: 0.8 10*3/uL (ref 0.1–1.0)
Monocytes Relative: 15.7 % — ABNORMAL HIGH (ref 3.0–12.0)
Neutro Abs: 2.9 10*3/uL (ref 1.4–7.7)
Neutrophils Relative %: 58.7 % (ref 43.0–77.0)
Platelets: 232 10*3/uL (ref 150.0–400.0)
RBC: 3.3 Mil/uL — ABNORMAL LOW (ref 4.22–5.81)
RDW: 19.8 % — ABNORMAL HIGH (ref 11.5–15.5)
WBC: 4.9 10*3/uL (ref 4.0–10.5)

## 2023-02-01 NOTE — Telephone Encounter (Signed)
Pt wife came in to receive a stool kit for her husband howeve there are no orders placed. Pt wife would like a call when the stool kit is ready for pick up.  Please advise.

## 2023-02-01 NOTE — Telephone Encounter (Signed)
CRITICAL VALUE STICKER  CRITICAL VALUE: Hemoglobin 8.0  RECEIVER (on-site recipient of call): Adam [name]  DATE & TIME NOTIFIED:  02/01/2023 at 3:24pm MESSENGER (representative from lab): Adam [name] at the [place] lab  MD NOTIFIED: Dr Ardyth Harps as PCP is out of the office  TIME OF NOTIFICATION:3:24pm  RESPONSE:

## 2023-02-02 NOTE — Telephone Encounter (Signed)
I spoke with the [patient's wife and she reported that next time she comes into the office she will pickup stool cards.

## 2023-02-02 NOTE — Telephone Encounter (Signed)
Spoke with the patient's wife and informed her of the message below.  Mrs Davee is aware someone will contact her with appointment information for the blood transfusion.  I called the Infusion Center at [phone] and was advised to call [phone].  I called this number and spoke with Revonda Standard who advised I call the blood bank for approval first due to the hemoglobin level.  Per [place] at the blood bank, [phone]) approval is not needed.  Physician order form was completed by Dr Ardyth Harps for type and cross with 2 units of PRBCs and faxed to the infusion center at [phone], along with medication list and test results.  Form sent to be scanned also.

## 2023-02-08 ENCOUNTER — Encounter: Payer: Self-pay | Admitting: Family Medicine

## 2023-02-09 ENCOUNTER — Other Ambulatory Visit (HOSPITAL_COMMUNITY): Payer: Self-pay | Admitting: *Deleted

## 2023-02-09 DIAGNOSIS — D649 Anemia, unspecified: Secondary | ICD-10-CM

## 2023-02-09 NOTE — Telephone Encounter (Signed)
I called the infusion center. Spoke with Joni Reining, transferred to the nurse Belenda Cruise.  Belenda Cruise stated she looked at the patient's hemoglobin which is an 8 and we would need to call the blood bank.  Belenda Cruise was informed I already spoke with [place] as below and no approval is needed.  She requested I call the patient to have him arrive on 5/9 at 8am-[place]-main entrance [address] off of [facility], go to admitting and ask for the infusion center.    Spoke with the patient's wife, all info above was given and she agreed to take the patient to the hospital.

## 2023-02-09 NOTE — Telephone Encounter (Signed)
Patient informed-please see prior phone note. 

## 2023-02-09 NOTE — Telephone Encounter (Signed)
Patient sent a Mychart message stating he has not received a call regarding the blood transfusion.  I left detailed message on the voicemail at the infusion center [phone]) to return my call.

## 2023-02-10 ENCOUNTER — Ambulatory Visit (HOSPITAL_COMMUNITY)
Admission: RE | Admit: 2023-02-10 | Discharge: 2023-02-10 | Disposition: A | Discharge status: Home / Self Care | Payer: Medicare Other | Source: Ambulatory Visit | Attending: Internal Medicine | Admitting: Internal Medicine

## 2023-02-10 DIAGNOSIS — D649 Anemia, unspecified: Secondary | ICD-10-CM | POA: Insufficient documentation

## 2023-02-10 LAB — TYPE AND SCREEN
ABO/RH(D): O POS
Unit division: 0

## 2023-02-10 LAB — BPAM RBC: Blood Product Expiration Date: 202406072359

## 2023-02-10 LAB — PREPARE RBC (CROSSMATCH)

## 2023-02-10 MED ORDER — SODIUM CHLORIDE 0.9% IV SOLUTION: Freq: Once | INTRAVENOUS | Status: DC

## 2023-02-11 ENCOUNTER — Encounter: Payer: Self-pay | Admitting: Family Medicine

## 2023-02-11 DIAGNOSIS — E119 Type 2 diabetes mellitus without complications: Secondary | ICD-10-CM | POA: Diagnosis not present

## 2023-02-11 LAB — TYPE AND SCREEN
Antibody Screen: NEGATIVE
Unit division: 0

## 2023-02-11 LAB — BPAM RBC
Blood Product Expiration Date: 202406072359
ISSUE DATE / TIME: 202405090930
ISSUE DATE / TIME: 202405091229
Unit Type and Rh: 5100
Unit Type and Rh: 5100

## 2023-02-18 ENCOUNTER — Ambulatory Visit (INDEPENDENT_AMBULATORY_CARE_PROVIDER_SITE_OTHER): Payer: Medicare Other | Admitting: Adult Health

## 2023-02-18 ENCOUNTER — Encounter: Payer: Self-pay | Admitting: Adult Health

## 2023-02-18 VITALS — BP 120/76 | HR 65 | Temp 96.7°F | Ht 70.0 in | Wt 183.0 lb

## 2023-02-18 DIAGNOSIS — D649 Anemia, unspecified: Secondary | ICD-10-CM

## 2023-02-18 LAB — CBC
HCT: 35.3 % — ABNORMAL LOW (ref 39.0–52.0)
Hemoglobin: 11 g/dL — ABNORMAL LOW (ref 13.0–17.0)
MCHC: 31.1 g/dL (ref 30.0–36.0)
MCV: 82.7 fl (ref 78.0–100.0)
Platelets: 238 10*3/uL (ref 150.0–400.0)
RBC: 4.27 Mil/uL (ref 4.22–5.81)
RDW: 19.8 % — ABNORMAL HIGH (ref 11.5–15.5)
WBC: 5.4 10*3/uL (ref 4.0–10.5)

## 2023-02-18 LAB — IBC + FERRITIN
Ferritin: 11.5 ng/mL — ABNORMAL LOW (ref 22.0–322.0)
Iron: 57 ug/dL (ref 42–165)
Saturation Ratios: 13 % — ABNORMAL LOW (ref 20.0–50.0)
TIBC: 438.2 ug/dL (ref 250.0–450.0)
Transferrin: 313 mg/dL (ref 212.0–360.0)

## 2023-02-18 NOTE — Progress Notes (Addendum)
Subjective:    Patient ID: Adam [name], male    DOB: 20-Apr-1951, 72 y.o.   MRN: [id number]  HPI 72 year old male who  has a past medical history of Alcohol withdrawal (HCC) (10/24/2019), Allergy, Anxiety, Ascending aortic aneurysm (HCC) (01/31/2018), Asthma, Cardiomegaly (10/17/2017), Colitis- colonoscopy 2014 (07/13/2015), COPD (chronic obstructive pulmonary disease) (HCC), Coronary artery disease, Depression, Diabetes mellitus without complication (HCC), Family history of polyps in the colon, Finger dislocation, GERD (gastroesophageal reflux disease), Gout, H/O atrial fibrillation without current medication, Heart palpitations, adenomatous colonic polyps (08/12/2010), Hyperlipidemia, Hypertension, OA (osteoarthritis), OSA (obstructive sleep apnea), Prediabetes, Protein-calorie malnutrition, severe (03/01/2020), RLS (restless legs syndrome), and Squamous cell carcinoma of scalp (2016).  He is a patient of Dr. Caryl Never who I am seeing today for follow up regarding anemia. He was originally seen on 01/25/2023 by another provider in the office at which time it was discovered that his hemoglobin was low at 8.4. One week follow up showed that his hemoglobin had dropped to 8.0. He had a blood transfusion about a week ago. He continues to feel very fatigued and is sleeping most of the day    Review of Systems See HPI   Past Medical History:  Diagnosis Date   Alcohol withdrawal (HCC) 10/24/2019   Allergy    Anxiety    history of PTSD following CABG   Ascending aortic aneurysm (HCC) 01/31/2018   43 x 42 mm, pt unaware   Asthma    Cardiomegaly 10/17/2017   Colitis- colonoscopy 2014 07/13/2015   COPD (chronic obstructive pulmonary disease) (HCC)    Coronary artery disease    x 6   Depression    Diabetes mellitus without complication (HCC)    Family history of polyps in the colon    Finger dislocation    Left pinkie   GERD (gastroesophageal reflux disease)    Gout    H/O atrial fibrillation  without current medication    following CABG with no documented episodes since then.   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    Social History   Socioeconomic History   Marital status: Married    Spouse name: Not on file   Number of children: 0   Years of education: Not on file   Highest education level: Not on file  Occupational History   Occupation: retired  Tobacco Use   Smoking status: Former    Packs/day: 1.00    Years: 7.00    Additional pack years: 0.00    Total pack years: 7.00    Types: Cigarettes    Quit date: 10/04/1977    Years since quitting: 45.4   Smokeless tobacco: Never   Tobacco comments:    Former smoker 01/26/23  Vaping Use   Vaping Use: Never used  Substance and Sexual Activity   Alcohol use: Yes    Alcohol/week: 3.0 - 4.0 standard drinks of alcohol    Types: 1 Glasses of wine, 2 - 3 Shots of liquor per week    Comment: 4 drinks daily 01/26/23   Drug use: No    Comment: Smoked Marijuana back in 1970s   Sexual activity:  Yes  Other Topics Concern   Not on file  Social History Narrative   Married, no children   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 - returned to [employer] from [place] 2014   Enjoys bird watching, reading historical books about WWII, watching tv   Social Determinants of Health   Financial Resource Strain: Low Risk  (01/05/2023)   Overall Financial Resource Strain (CARDIA)    Difficulty of Paying Living Expenses: Not hard at all  Food Insecurity: No Food Insecurity (01/05/2023)   Hunger Vital Sign    Worried About Running Out of Food in the Last Year: Never true    Ran Out of Food in the Last Year: Never  true  Transportation Needs: No Transportation Needs (01/05/2023)   PRAPARE - Administrator, Civil Service (Medical): No    Lack of Transportation (Non-Medical): No  Physical Activity: Inactive (01/05/2023)   Exercise Vital Sign    Days of Exercise per Week: 0 days    Minutes of Exercise per Session: 0 min  Stress: No Stress Concern Present (01/05/2023)   [employer] of Occupational Health - Occupational Stress Questionnaire    Feeling of Stress : Not at all  Social Connections: Moderately Isolated (01/05/2023)   Social Connection and Isolation Panel [NHANES]    Frequency of Communication with Friends and Family: More than three times a week    Frequency of Social Gatherings with Friends and Family: More than three times a week    Attends Religious Services: Never    Database administrator or Organizations: No    Attends Banker Meetings: Never    Marital Status: Married  Catering manager Violence: Not At Risk (01/05/2023)   Humiliation, Afraid, Rape, and Kick questionnaire    Fear of Current or Ex-Partner: No    Emotionally Abused: No    Physically Abused: No    Sexually Abused: No    Past Surgical History:  Procedure Laterality Date   ANKLE FRACTURE SURGERY Right 1991   APPENDECTOMY     ATRIAL FIBRILLATION ABLATION N/A 03/31/2018   Procedure: ATRIAL FIBRILLATION ABLATION;  Surgeon: Hillis Range, MD;  Location: [facility];  Service: Cardiovascular;  Laterality: N/A;   CARDIAC ELECTROPHYSIOLOGY MAPPING AND ABLATION     CARDIOVERSION N/A 03/23/2021   Procedure: CARDIOVERSION;  Surgeon: Elease Hashimoto Deloris Ping, MD;  Location: [facility] ENDOSCOPY;  Service: Cardiovascular;  Laterality: N/A;   COLONOSCOPY  06/11/2021   COLONOSCOPY W/ BIOPSIES  2017   x7   CORONARY ANGIOPLASTY WITH STENT PLACEMENT     CORONARY ARTERY BYPASS GRAFT     FINGER SURGERY  04/2018   Small finger left hand   implantable loop recorder placement  07/02/2019   Medtronic Reveal [place] model  LNQ11 (SN [id number]) implanted in office by Dr Johney Frame   TEE WITHOUT CARDIOVERSION N/A 03/30/2018   Procedure: TRANSESOPHAGEAL ECHOCARDIOGRAM (TEE);  Surgeon: Thurmon Fair, MD;  Location: [facility] ENDOSCOPY;  Service: Cardiovascular;  Laterality: N/A;   TEE WITHOUT CARDIOVERSION N/A 03/23/2021   Procedure: TRANSESOPHAGEAL ECHOCARDIOGRAM (TEE);  Surgeon: Elease Hashimoto Deloris Ping, MD;  Location: [facility] ENDOSCOPY;  Service: Cardiovascular;  Laterality: N/A;   TOTAL HIP ARTHROPLASTY Left    TOTAL HIP ARTHROPLASTY Right 09/12/2018   Procedure: TOTAL HIP ARTHROPLASTY ANTERIOR APPROACH;  Surgeon: Durene Romans, MD;  Location: [facility] ORS;  Service: Orthopedics;  Laterality: Right;  70 mins    Family History  Problem Relation Age of Onset  Colon cancer Mother    Cancer Father        UNKNOWN TYPE   Heart disease Paternal Grandmother    Heart disease Paternal Grandfather    Esophageal cancer Neg Hx    Rectal cancer Neg Hx    Stomach cancer Neg Hx     Allergies  Allergen Reactions   Amoxicillin Rash and Other (See Comments)    RASH IN GROIN AREA Has patient had a PCN reaction causing immediate rash, facial/tongue/throat swelling, SOB or lightheadedness with hypotension: no Has patient had a PCN reaction causing severe rash involving mucus membranes or skin necrosis: no Has patient had a PCN reaction that required hospitalization: no Has patient had a PCN reaction occurring within the last 10 years: yes If all of the above answers are "NO", then may proceed with Cephalosporin use.    Augmentin [Amoxicillin-Pot Clavulanate] Rash and Other (See Comments)    RASH IN GROIN AREA   Azithromycin Rash and Other (See Comments)    RASH IN GROIN AREA   Clindamycin/Lincomycin Rash   Keflex [Cephalexin] Rash   Xarelto [Rivaroxaban] Other (See Comments)    Patient stated he "ended up in the hospital with a rectal bleed"    Current Outpatient Medications on File Prior to Visit  Medication Sig Dispense Refill    allopurinol (ZYLOPRIM) 300 MG tablet TAKE 1 TABLET(300 MG) BY MOUTH DAILY 90 tablet 0   amiodarone (PACERONE) 200 MG tablet TAKE 1 TABLET(200 MG) BY MOUTH DAILY 90 tablet 1   apixaban (ELIQUIS) 5 MG TABS tablet Take 1 tablet (5 mg total) by mouth 2 (two) times daily. 180 tablet 3   B COMPLEX-C-FOLIC ACID PO Take 1 tablet by mouth daily.     Continuous Blood Gluc Sensor (DEXCOM G7 SENSOR) MISC by Does not apply route. Every 10 days     DULoxetine (CYMBALTA) 60 MG capsule Take one capsule by mouth once daily 90 capsule 3   gabapentin (NEURONTIN) 300 MG capsule TAKE 2 CAPSULES BY MOUTH AT NIGHT AS NEEDED FOR RESTLESS LEGS OR SYMPTOMS 180 capsule 0   HM LIDOCAINE PATCH EX Apply 1 patch topically as needed (pain).     Insulin Pen Needle (NOVOFINE PEN NEEDLE) 32G X 6 MM MISC Use 1-4 times daily as needed for insulin 100 each 3   lisinopril (ZESTRIL) 10 MG tablet Take 1 tablet (10 mg total) by mouth daily. 90 tablet 3   Magnesium Oxide 400 MG CAPS Take 1 capsule (400 mg total) by mouth daily. 90 capsule 1   metoprolol tartrate (LOPRESSOR) 50 MG tablet TAKE 1 TABLET(50 MG) BY MOUTH TWICE DAILY 180 tablet 1   montelukast (SINGULAIR) 10 MG tablet Take 1 tablet (10 mg total) by mouth at bedtime. 90 tablet 0   rosuvastatin (CRESTOR) 40 MG tablet TAKE 1 TABLET(40 MG) BY MOUTH DAILY 90 tablet 3   SKYRIZI PEN 150 MG/ML SOAJ Inject into the skin. Takes every 3 months     sodium chloride (OCEAN) 0.65 % SOLN nasal spray Place 1 spray into both nostrils as needed for congestion.     Spacer/Aero-Holding Chambers DEVI Use with inhaler 1 each 1   SYNJARDY XR 25-1000 MG TB24 TAKE 1 TABLET BY MOUTH DAILY 90 tablet 0   thiamine (VITAMIN B-1) 100 MG tablet Take 500 mg by mouth daily.     TOUJEO SOLOSTAR 300 UNIT/ML Solostar Pen INJECT 60 UNITS INTO THE SKIN EVERY DAY (Patient taking differently: Inject 52 Units into the skin daily.) 33 mL  3   No current facility-administered medications on file prior to visit.    BP  120/76   Pulse 65   Temp (!) 96.7 F (35.9 C) (Tympanic)   Ht 5\' 10"  (1.778 m)   Wt 183 lb (83 kg)   SpO2 97%   BMI 26.26 kg/m       Objective:   Physical Exam Vitals and nursing note reviewed.  Constitutional:      Appearance: Normal appearance.  Cardiovascular:     Rate and Rhythm: Normal rate and regular rhythm.     Pulses: Normal pulses.     Heart sounds: Normal heart sounds.  Pulmonary:     Effort: Pulmonary effort is normal.     Breath sounds: Normal breath sounds.  Genitourinary:    Rectum: Normal. Guaiac result negative.  Musculoskeletal:        General: Normal range of motion.  Neurological:     General: No focal deficit present.     Mental Status: He is alert and oriented to person, place, and time.  Psychiatric:        Mood and Affect: Mood normal.        Behavior: Behavior normal.        Thought Content: Thought content normal.        Assessment & Plan:  1. Anemia, unspecified type - hemoccult negative.  - Will recheck his CBC and check iron panel  - Likely need to follow up with Dr. Leone Payor - CBC; Future - IBC + Ferritin; Future  Shirline Frees, NP

## 2023-02-24 NOTE — Progress Notes (Signed)
[facility] Summary Report / Loop Recorder 

## 2023-03-07 ENCOUNTER — Other Ambulatory Visit: Payer: Self-pay | Admitting: Family Medicine

## 2023-03-09 DIAGNOSIS — D6869 Other thrombophilia: Secondary | ICD-10-CM | POA: Diagnosis not present

## 2023-03-09 DIAGNOSIS — M109 Gout, unspecified: Secondary | ICD-10-CM | POA: Diagnosis not present

## 2023-03-09 DIAGNOSIS — I509 Heart failure, unspecified: Secondary | ICD-10-CM | POA: Diagnosis not present

## 2023-03-10 ENCOUNTER — Other Ambulatory Visit: Payer: Self-pay | Admitting: Family Medicine

## 2023-03-14 DIAGNOSIS — E119 Type 2 diabetes mellitus without complications: Secondary | ICD-10-CM | POA: Diagnosis not present

## 2023-03-16 DIAGNOSIS — L249 Irritant contact dermatitis, unspecified cause: Secondary | ICD-10-CM | POA: Diagnosis not present

## 2023-03-16 DIAGNOSIS — L570 Actinic keratosis: Secondary | ICD-10-CM | POA: Diagnosis not present

## 2023-03-18 ENCOUNTER — Encounter: Payer: Self-pay | Admitting: Adult Health

## 2023-03-21 ENCOUNTER — Other Ambulatory Visit (HOSPITAL_COMMUNITY): Payer: Self-pay | Admitting: *Deleted

## 2023-03-21 MED ORDER — AMIODARONE HCL 200 MG PO TABS: ORAL_TABLET | ORAL | 2 refills | Status: DC

## 2023-03-21 NOTE — Telephone Encounter (Signed)
Please advise 

## 2023-03-22 ENCOUNTER — Encounter: Payer: Self-pay | Admitting: Family Medicine

## 2023-03-29 ENCOUNTER — Encounter: Payer: Self-pay | Admitting: Family Medicine

## 2023-03-29 ENCOUNTER — Ambulatory Visit (INDEPENDENT_AMBULATORY_CARE_PROVIDER_SITE_OTHER): Payer: Medicare Other | Admitting: Family Medicine

## 2023-03-29 VITALS — BP 140/76 | HR 70 | Ht 70.0 in | Wt 178.8 lb

## 2023-03-29 DIAGNOSIS — R809 Proteinuria, unspecified: Secondary | ICD-10-CM

## 2023-03-29 DIAGNOSIS — E1142 Type 2 diabetes mellitus with diabetic polyneuropathy: Secondary | ICD-10-CM | POA: Diagnosis not present

## 2023-03-29 DIAGNOSIS — R300 Dysuria: Secondary | ICD-10-CM | POA: Diagnosis not present

## 2023-03-29 DIAGNOSIS — E876 Hypokalemia: Secondary | ICD-10-CM

## 2023-03-29 DIAGNOSIS — D509 Iron deficiency anemia, unspecified: Secondary | ICD-10-CM

## 2023-03-29 DIAGNOSIS — Z794 Long term (current) use of insulin: Secondary | ICD-10-CM

## 2023-03-29 DIAGNOSIS — R634 Abnormal weight loss: Secondary | ICD-10-CM

## 2023-03-29 DIAGNOSIS — Z7984 Long term (current) use of oral hypoglycemic drugs: Secondary | ICD-10-CM

## 2023-03-29 DIAGNOSIS — T07XXXA Unspecified multiple injuries, initial encounter: Secondary | ICD-10-CM

## 2023-03-29 NOTE — Progress Notes (Signed)
Established Patient Office Visit  Subjective   Patient ID: Adam [name], male    DOB: Jun 10, 1951  Age: 72 y.o. MRN: [id number]  Chief Complaint  Patient presents with   Insomnia   Results    HPI   [name] is here accompanied by wife.  Communication is somewhat challenging because of his speech impediment from his Wernicke encephalopathy. His chronic problems include longstanding history of alcohol abuse.  He still drinks fairly heavily according to wife and only eats about 1 meal per day. He has history of hypertension, CAD, atrial fibrillation, COPD, obstructive sleep apnea, type 2 diabetes, GERD, hyperlipidemia, alcohol abuse, and gout.  He had recent fall about 3 weeks ago when he was outdoors.  He lost his balance but there is no syncope history.  He had multiple abrasions including both knees, hands, and right forearm.  Eschar in multiple areas but healing well with no signs of secondary infection.  He does take Eliquis for his atrial fibrillation.  There is no head injury reported.  They bring in paper from housecalls visit.  He reportedly had urine albumin-creatinine ratio of 531.  This is somewhat surprising since he had urine microalbumin creatinine ratio here in our office 8 months ago 55.  He also already takes [place] which includes 25 mg of Jardiance and also on lisinopril 10 mg daily.  He had recent labs in April with GFR of 87.7.  Has had substantial weight loss of about 60 pounds over the past year.  However, he is still drinking heavily and only eats about 1 meal per day.  Is also had significant dental problems which has made eating difficult.  Inconsistent with taking supplements.  He had labs recently in my absence with hemoglobin of 8 with ferritin 11.5.  Was given Hemoccult cards but never completed those.  He apparently was sent for blood transfusion and follow-up lab hemoglobin increased to 11.0.  Never saw any bloody stools or melena.  No recent abdominal pain.  He  states he had several teeth pulled March 12 and never came off Eliquis.  He states he had some oozing of blood for several weeks after that from his gums.  He is taking once daily iron supplement but has had some recent constipation issues.  Last colonoscopy reportedly 2017  Past Medical History:  Diagnosis Date   Alcohol withdrawal (HCC) 10/24/2019   Allergy    Anxiety    history of PTSD following CABG   Ascending aortic aneurysm (HCC) 01/31/2018   43 x 42 mm, pt unaware   Asthma    Cardiomegaly 10/17/2017   Colitis- colonoscopy 2014 07/13/2015   COPD (chronic obstructive pulmonary disease) (HCC)    Coronary artery disease    x 6   Depression    Diabetes mellitus without complication (HCC)    Family history of polyps in the colon    Finger dislocation    Left pinkie   GERD (gastroesophageal reflux disease)    Gout    H/O atrial fibrillation without current medication    following CABG with no documented episodes since then.   Heart palpitations    Hx of adenomatous colonic polyps 08/12/2010   Hyperlipidemia    Hypertension    OA (osteoarthritis)    OSA (obstructive sleep apnea)    Mild, has not received CPAP yet   Prediabetes    Protein-calorie malnutrition, severe 03/01/2020   RLS (restless legs syndrome)    Squamous cell carcinoma of scalp 2016  Moh's   Past Surgical History:  Procedure Laterality Date   ANKLE FRACTURE SURGERY Right 1991   APPENDECTOMY     ATRIAL FIBRILLATION ABLATION N/A 03/31/2018   Procedure: ATRIAL FIBRILLATION ABLATION;  Surgeon: Hillis Range, MD;  Location: [facility] INVASIVE CV LAB;  Service: Cardiovascular;  Laterality: N/A;   CARDIAC ELECTROPHYSIOLOGY MAPPING AND ABLATION     CARDIOVERSION N/A 03/23/2021   Procedure: CARDIOVERSION;  Surgeon: Elease Hashimoto Deloris Ping, MD;  Location: [facility] ENDOSCOPY;  Service: Cardiovascular;  Laterality: N/A;   COLONOSCOPY  06/11/2021   COLONOSCOPY W/ BIOPSIES  2017   x7   CORONARY ANGIOPLASTY WITH STENT PLACEMENT      CORONARY ARTERY BYPASS GRAFT     FINGER SURGERY  04/2018   Small finger left hand   implantable loop recorder placement  07/02/2019   Medtronic Reveal [place] model LNQ11 (SN [id number]) implanted in office by Dr Johney Frame   TEE WITHOUT CARDIOVERSION N/A 03/30/2018   Procedure: TRANSESOPHAGEAL ECHOCARDIOGRAM (TEE);  Surgeon: Thurmon Fair, MD;  Location: [facility] ENDOSCOPY;  Service: Cardiovascular;  Laterality: N/A;   TEE WITHOUT CARDIOVERSION N/A 03/23/2021   Procedure: TRANSESOPHAGEAL ECHOCARDIOGRAM (TEE);  Surgeon: Elease Hashimoto Deloris Ping, MD;  Location: [facility] ENDOSCOPY;  Service: Cardiovascular;  Laterality: N/A;   TOTAL HIP ARTHROPLASTY Left    TOTAL HIP ARTHROPLASTY Right 09/12/2018   Procedure: TOTAL HIP ARTHROPLASTY ANTERIOR APPROACH;  Surgeon: Durene Romans, MD;  Location: [facility] ORS;  Service: Orthopedics;  Laterality: Right;  70 mins    reports that he quit smoking about 45 years ago. His smoking use included cigarettes. He has a 7.00 pack-year smoking history. He has never used smokeless tobacco. He reports current alcohol use of about 3.0 - 4.0 standard drinks of alcohol per week. He reports that he does not use drugs. family history includes Cancer in his father; Colon cancer in his mother; Heart disease in his paternal grandfather and paternal grandmother. Allergies  Allergen Reactions   Amoxicillin Rash and Other (See Comments)    RASH IN GROIN AREA Has patient had a PCN reaction causing immediate rash, facial/tongue/throat swelling, SOB or lightheadedness with hypotension: no Has patient had a PCN reaction causing severe rash involving mucus membranes or skin necrosis: no Has patient had a PCN reaction that required hospitalization: no Has patient had a PCN reaction occurring within the last 10 years: yes If all of the above answers are "NO", then may proceed with Cephalosporin use.    Augmentin [Amoxicillin-Pot Clavulanate] Rash and Other (See Comments)    RASH IN GROIN AREA   Azithromycin Rash  and Other (See Comments)    RASH IN GROIN AREA   Clindamycin/Lincomycin Rash   Keflex [Cephalexin] Rash   Xarelto [Rivaroxaban] Other (See Comments)    Patient stated he "ended up in the hospital with a rectal bleed"    Review of Systems  Constitutional:  Positive for weight loss. Negative for chills and fever.  Respiratory:  Negative for cough and shortness of breath.   Cardiovascular:  Negative for chest pain.  Gastrointestinal:  Positive for constipation. Negative for blood in stool, diarrhea, melena, nausea and vomiting.  Neurological:  Negative for focal weakness.      Objective:     BP (!) 140/76 (BP Location: Left Arm, Patient Position: Sitting, Cuff Size: Normal)   Pulse 70   Ht 5\' 10"  (1.778 m)   Wt 178 lb 12.8 oz (81.1 kg)   SpO2 96%   BMI 25.66 kg/m  BP Readings from Last 3 Encounters:  03/29/23 Marland Kitchen)  140/76  02/18/23 120/76  02/10/23 131/66   Wt Readings from Last 3 Encounters:  03/29/23 178 lb 12.8 oz (81.1 kg)  02/18/23 183 lb (83 kg)  01/26/23 184 lb 3.2 oz (83.6 kg)      Physical Exam Vitals reviewed. Nursing note reviewed: Alert cooperative 72 year old male. Cardiovascular:     Rate and Rhythm: Normal rate.  Pulmonary:     Effort: Pulmonary effort is normal.     Breath sounds: Normal breath sounds. No wheezing or rales.  Skin:    Comments: He has multiple abrasions from recent fall including knuckles of both hands, right forearm, knees bilaterally.  No signs of cellulitis.      No results found for any visits on 03/29/23.  Last CBC Lab Results  Component Value Date   WBC 5.4 02/18/2023   HGB 11.0 (L) 02/18/2023   HCT 35.3 (L) 02/18/2023   MCV 82.7 02/18/2023   MCH 34.3 (H) 11/05/2022   RDW 19.8 (H) 02/18/2023   PLT 238.0 02/18/2023   Last metabolic panel Lab Results  Component Value Date   GLUCOSE 112 (H) 01/25/2023   NA 141 01/25/2023   K 3.5 01/25/2023   CL 107 01/25/2023   CO2 25 01/25/2023   BUN 15 01/25/2023   CREATININE  0.82 01/25/2023   EGFR 95 06/11/2022   CALCIUM 9.6 01/25/2023   PHOS 3.2 03/20/2021   PROT 7.0 01/25/2023   ALBUMIN 4.1 01/25/2023   LABGLOB 2.7 06/11/2022   AGRATIO 1.6 06/11/2022   BILITOT 0.7 01/25/2023   ALKPHOS 61 01/25/2023   AST 19 01/25/2023   ALT 12 01/25/2023   ANIONGAP 9 01/27/2022   Last lipids Lab Results  Component Value Date   CHOL 92 01/25/2023   HDL 38.50 (L) 01/25/2023   LDLCALC 31 01/25/2023   LDLDIRECT 124.0 06/23/2018   TRIG 115.0 01/25/2023   CHOLHDL 2 01/25/2023   Last hemoglobin A1c Lab Results  Component Value Date   HGBA1C 5.3 01/25/2023   Last thyroid functions Lab Results  Component Value Date   TSH 2.13 01/25/2023      The ASCVD Risk score (Arnett DK, et al., 2019) failed to calculate for the following reasons:   The valid total cholesterol range is 130 to 320 mg/dL    Assessment & Plan:   #1 type 2 diabetes.  History of recent excellent control with most recent A1c 5.3% couple months ago.  He remains on [place].  No recent reported hypoglycemic episodes. We will need to gradually reduce his Toujeo specially in view of recent A1c of 5.3%.  #2 proteinuria with recent reported urine albumin creatinine ratio 531 which is up substantially from last fall.  Will recheck urine microalbumin creatinine ratio here.  He already takes [place] which includes 25 mg of Jardiance.  He also is on lisinopril 10 mg daily.  If urine microalbumin screen confirms significant proteinuria consider further titration of lisinopril to 20 mg daily.  Recent GFR was 87 which is stable  #3 iron deficiency anemia.  Patient never completed Hemoccult cards.  He states he had multiple teeth pulled in March while taking Eliquis and had significant ongoing bleeding for days.  Not clear if this is the source of his anemia.  Recheck CBC today.  Continue iron replacement as tolerated.  #4 multiple abrasions including knees, hands, forearm following fall.  No signs  of secondary fraction.  Continue to clean daily with soap and water. He has very high risk for falls  secondary to his history of polyneuropathy, generalized weakness, and Wernicke encephalopathy.  #5 hypertension stable on lisinopril and metoprolol  #6 weight loss.  Suspect related to poor dentition and poor intake and ongoing alcohol abuse. -He has difficulty with chewing certain foods.  We recommend he consider high-protein nutritious smoothie with specific ideas given for that. -Also consider supplements such as Glucerna -Bring back in 1 month to reassess -He had low motivation to quit alcohol in the past.  Patient has significant drooling and had requested prescription for scopolamine.  We have declined with concern for concerned that this could increase his risk of falls secondary to anticholinergic effects.  Spent over 40 minutes in direct patient care reviewing and addressing multiple medical problems as above.  Return in about 1 month (around 04/28/2023).    Evelena Peat, MD

## 2023-03-30 LAB — MICROALBUMIN / CREATININE URINE RATIO
Creatinine,U: 74 mg/dL
Microalb Creat Ratio: 35.5 mg/g — ABNORMAL HIGH (ref 0.0–30.0)
Microalb, Ur: 26.2 mg/dL — ABNORMAL HIGH (ref 0.0–1.9)

## 2023-03-30 LAB — BASIC METABOLIC PANEL
BUN: 11 mg/dL (ref 6–23)
CO2: 28 mEq/L (ref 19–32)
Calcium: 9.7 mg/dL (ref 8.4–10.5)
Chloride: 104 mEq/L (ref 96–112)
Creatinine, Ser: 0.81 mg/dL (ref 0.40–1.50)
GFR: 87.92 mL/min (ref >60.00)
Glucose, Bld: 97 mg/dL (ref 70–99)
Potassium: 3.3 mEq/L — ABNORMAL LOW (ref 3.5–5.1)
Sodium: 141 mEq/L (ref 135–145)

## 2023-03-30 LAB — CBC WITH DIFFERENTIAL/PLATELET
Basophils Absolute: 0.1 10*3/uL (ref 0.0–0.1)
Basophils Relative: 0.8 % (ref 0.0–3.0)
Eosinophils Absolute: 0.2 10*3/uL (ref 0.0–0.7)
Eosinophils Relative: 2.4 % (ref 0.0–5.0)
HCT: 35.8 % — ABNORMAL LOW (ref 39.0–52.0)
Hemoglobin: 11.3 g/dL — ABNORMAL LOW (ref 13.0–17.0)
Lymphocytes Relative: 19.5 % (ref 12.0–46.0)
Lymphs Abs: 1.4 10*3/uL (ref 0.7–4.0)
MCHC: 31.6 g/dL (ref 30.0–36.0)
MCV: 91.3 fl (ref 78.0–100.0)
Monocytes Absolute: 1 10*3/uL (ref 0.1–1.0)
Monocytes Relative: 13.3 % — ABNORMAL HIGH (ref 3.0–12.0)
Neutro Abs: 4.7 10*3/uL (ref 1.4–7.7)
Neutrophils Relative %: 64 % (ref 43.0–77.0)
Platelets: 234 10*3/uL (ref 150.0–400.0)
RBC: 3.92 Mil/uL — ABNORMAL LOW (ref 4.22–5.81)
RDW: 26 % — ABNORMAL HIGH (ref 11.5–15.5)
WBC: 7.3 10*3/uL (ref 4.0–10.5)

## 2023-03-30 NOTE — Addendum Note (Signed)
Addended by: Johnella Moloney on: 03/30/2023 01:26 PM   Modules accepted: Orders

## 2023-03-31 MED ORDER — LISINOPRIL 20 MG PO TABS: 20.0000 mg | ORAL_TABLET | Freq: Every day | ORAL | 0 refills | Status: DC

## 2023-03-31 NOTE — Addendum Note (Signed)
Addended by: Christy Sartorius on: 03/31/2023 12:06 PM   Modules accepted: Orders

## 2023-04-01 ENCOUNTER — Other Ambulatory Visit: Payer: Self-pay

## 2023-04-01 DIAGNOSIS — E1169 Type 2 diabetes mellitus with other specified complication: Secondary | ICD-10-CM

## 2023-04-01 MED ORDER — ROSUVASTATIN CALCIUM 40 MG PO TABS
ORAL_TABLET | ORAL | 2 refills | Status: DC
Start: 2023-04-01 — End: 2023-12-30

## 2023-04-12 ENCOUNTER — Other Ambulatory Visit: Payer: Self-pay | Admitting: Cardiology

## 2023-04-12 NOTE — Telephone Encounter (Signed)
Pt last saw Clint [name], PA on 01/26/23, last labs 03/29/23 Creat 0.81, age 72, weight 81.1kg, based on specified criteria pt is on appropriate dosage of Eliquis 5mg  BID for afib.  Will refill rx.

## 2023-04-13 DIAGNOSIS — E119 Type 2 diabetes mellitus without complications: Secondary | ICD-10-CM | POA: Diagnosis not present

## 2023-04-28 ENCOUNTER — Other Ambulatory Visit: Payer: Medicare Other

## 2023-04-28 DIAGNOSIS — E876 Hypokalemia: Secondary | ICD-10-CM

## 2023-04-28 LAB — BASIC METABOLIC PANEL
BUN: 9 mg/dL (ref 6–23)
CO2: 30 mEq/L (ref 19–32)
Calcium: 9.8 mg/dL (ref 8.4–10.5)
Chloride: 106 mEq/L (ref 96–112)
Creatinine, Ser: 0.72 mg/dL (ref 0.40–1.50)
GFR: 91.06 mL/min (ref >60.00)
Glucose, Bld: 108 mg/dL — ABNORMAL HIGH (ref 70–99)
Potassium: 3.6 mEq/L (ref 3.5–5.1)
Sodium: 143 mEq/L (ref 135–145)

## 2023-05-04 ENCOUNTER — Encounter (INDEPENDENT_AMBULATORY_CARE_PROVIDER_SITE_OTHER): Payer: Self-pay

## 2023-05-14 DIAGNOSIS — E119 Type 2 diabetes mellitus without complications: Secondary | ICD-10-CM | POA: Diagnosis not present

## 2023-05-27 ENCOUNTER — Encounter: Payer: Self-pay | Admitting: Family Medicine

## 2023-05-27 ENCOUNTER — Ambulatory Visit: Payer: Medicare Other | Admitting: Family Medicine

## 2023-05-27 VITALS — BP 116/66 | HR 65 | Ht 70.0 in | Wt 178.2 lb

## 2023-05-27 DIAGNOSIS — R2689 Other abnormalities of gait and mobility: Secondary | ICD-10-CM

## 2023-05-27 DIAGNOSIS — F10982 Alcohol use, unspecified with alcohol-induced sleep disorder: Secondary | ICD-10-CM

## 2023-05-27 DIAGNOSIS — B356 Tinea cruris: Secondary | ICD-10-CM

## 2023-05-27 DIAGNOSIS — E1142 Type 2 diabetes mellitus with diabetic polyneuropathy: Secondary | ICD-10-CM | POA: Diagnosis not present

## 2023-05-27 DIAGNOSIS — Z7984 Long term (current) use of oral hypoglycemic drugs: Secondary | ICD-10-CM

## 2023-05-27 DIAGNOSIS — E512 Wernicke's encephalopathy: Secondary | ICD-10-CM | POA: Diagnosis not present

## 2023-05-27 DIAGNOSIS — R1310 Dysphagia, unspecified: Secondary | ICD-10-CM

## 2023-05-27 LAB — POCT GLYCOSYLATED HEMOGLOBIN (HGB A1C): Hemoglobin A1C: 5.7 % — AB (ref 4.0–5.6)

## 2023-05-27 NOTE — Progress Notes (Unsigned)
Established Patient Office Visit  Subjective   Patient ID: Adam [name], male    DOB: September 16, 1951  Age: 72 y.o. MRN: [id number]  Chief Complaint  Patient presents with   Medical Management of Chronic Issues    HPI  {History (Optional):23778} Adam [name] is seen today accompanied by his wife for medical follow-up.  He no longer drives.  He has multiple chronic problems including hypertension, CAD, atrial fibrillation, COPD, sleep apnea, longstanding history of alcohol abuse, type 2 diabetes, Wernicke encephalopathy.  Several issues addressed as below  Type 2 diabetes.  Has lost significant weight in recent years.  Had been taking fairly high-dose Toujeo but currently off insulin altogether.  Does remain on Synjardy.  History of reduced ejection fraction and we expressed our desire to keep him on SGLT2 inhibitor if possible.  He is good renal function by most recent labs.  Blood sugar stable.  A1c today 5.7%.  He has mildly pruritic erythematous rash slightly scaly groin region bilaterally.  Present for few weeks.  He has history of some incontinence and uses pull-ups.  Tried over-the-counter hydrocortisone cream.  Progressive difficulty swallowing.  He has neurologic difficulties related to his encephalopathy history.  Has seen neurology in the past but basically fell out of follow-up.  Specially having difficulties with liquids.  Has balance difficulties with walking and uses a walker at home.  They would like to consider getting back into see neurology again  Chronic insomnia.  Unfortunately still using some alcohol although his wife states he has scaled back considerably.  We have multiple times discuss how alcohol can contribute to insomnia.  Extremely high risk for falls and we discussed reservations for various sedative hypnotics and especially benzodiazepines.  Past Medical History:  Diagnosis Date   Alcohol withdrawal (HCC) 10/24/2019   Allergy    Anxiety    history of PTSD following  CABG   Ascending aortic aneurysm (HCC) 01/31/2018   43 x 42 mm, pt unaware   Asthma    Cardiomegaly 10/17/2017   Colitis- colonoscopy 2014 07/13/2015   COPD (chronic obstructive pulmonary disease) (HCC)    Coronary artery disease    x 6   Depression    Diabetes mellitus without complication (HCC)    Family history of polyps in the colon    Finger dislocation    Left pinkie   GERD (gastroesophageal reflux disease)    Gout    H/O atrial fibrillation without current medication    following CABG with no documented episodes since then.   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   Past Surgical History:  Procedure Laterality Date   ANKLE FRACTURE SURGERY Right 1991   APPENDECTOMY     ATRIAL FIBRILLATION ABLATION N/A 03/31/2018   Procedure: ATRIAL FIBRILLATION ABLATION;  Surgeon: Hillis Range, MD;  Location: [facility];  Service: Cardiovascular;  Laterality: N/A;   CARDIAC ELECTROPHYSIOLOGY MAPPING AND ABLATION     CARDIOVERSION N/A 03/23/2021   Procedure: CARDIOVERSION;  Surgeon: Elease Hashimoto Deloris Ping, MD;  Location: [facility] ENDOSCOPY;  Service: Cardiovascular;  Laterality: N/A;   COLONOSCOPY  06/11/2021   COLONOSCOPY W/ BIOPSIES  2017  x7   CORONARY ANGIOPLASTY WITH STENT PLACEMENT     CORONARY ARTERY BYPASS GRAFT     FINGER SURGERY  04/2018   Small finger left hand   implantable loop recorder placement  07/02/2019   Medtronic Reveal [place] model LNQ11 (SN [id number]) implanted in office by Dr Johney Frame   TEE WITHOUT CARDIOVERSION N/A 03/30/2018   Procedure: TRANSESOPHAGEAL ECHOCARDIOGRAM (TEE);  Surgeon: Thurmon Fair, MD;  Location: [facility] ENDOSCOPY;  Service: Cardiovascular;  Laterality:  N/A;   TEE WITHOUT CARDIOVERSION N/A 03/23/2021   Procedure: TRANSESOPHAGEAL ECHOCARDIOGRAM (TEE);  Surgeon: Elease Hashimoto Deloris Ping, MD;  Location: [facility] ENDOSCOPY;  Service: Cardiovascular;  Laterality: N/A;   TOTAL HIP ARTHROPLASTY Left    TOTAL HIP ARTHROPLASTY Right 09/12/2018   Procedure: TOTAL HIP ARTHROPLASTY ANTERIOR APPROACH;  Surgeon: Durene Romans, MD;  Location: [facility] ORS;  Service: Orthopedics;  Laterality: Right;  70 mins    reports that he quit smoking about 45 years ago. His smoking use included cigarettes. He started smoking about 52 years ago. He has a 7 pack-year smoking history. He has never used smokeless tobacco. He reports current alcohol use of about 3.0 - 4.0 standard drinks of alcohol per week. He reports that he does not use drugs. family history includes Cancer in his father; Colon cancer in his mother; Heart disease in his paternal grandfather and paternal grandmother. Allergies  Allergen Reactions   Amoxicillin Rash and Other (See Comments)    RASH IN GROIN AREA Has patient had a PCN reaction causing immediate rash, facial/tongue/throat swelling, SOB or lightheadedness with hypotension: no Has patient had a PCN reaction causing severe rash involving mucus membranes or skin necrosis: no Has patient had a PCN reaction that required hospitalization: no Has patient had a PCN reaction occurring within the last 10 years: yes If all of the above answers are "NO", then may proceed with Cephalosporin use.    Augmentin [Amoxicillin-Pot Clavulanate] Rash and Other (See Comments)    RASH IN GROIN AREA   Azithromycin Rash and Other (See Comments)    RASH IN GROIN AREA   Clindamycin/Lincomycin Rash   Keflex [Cephalexin] Rash   Xarelto [Rivaroxaban] Other (See Comments)    Patient stated he "ended up in the hospital with a rectal bleed"    Review of Systems  Constitutional:  Negative for chills and fever.  Eyes:  Negative for blurred vision.  Respiratory:  Negative for shortness of  breath.   Cardiovascular:  Negative for chest pain.  Gastrointestinal:  Negative for abdominal pain.  Genitourinary:  Negative for dysuria.  Skin:  Positive for rash.  Neurological:  Negative for dizziness, weakness and headaches.      Objective:     BP 116/66 (BP Location: Left Arm, Patient Position: Sitting, Cuff Size: Normal)   Pulse 65   Ht 5\' 10"  (1.778 m)   Wt 178 lb 3.2 oz (80.8 kg)   SpO2 99%   BMI 25.57 kg/m  BP Readings from Last 3 Encounters:  05/27/23 116/66  03/30/23 (!) 140/76  02/18/23 120/76   Wt Readings from Last 3 Encounters:  05/27/23 178 lb 3.2 oz (80.8 kg)  03/29/23 178 lb 12.8 oz (81.1 kg)  02/18/23 183 lb (83 kg)      Physical Exam Vitals reviewed.  Constitutional:      Comments: Patient is essentially noncommunicative verbally.  He communicates by writing  Cardiovascular:     Rate and Rhythm: Normal rate and regular rhythm.  Pulmonary:     Effort: Pulmonary effort  is normal.     Breath sounds: Normal breath sounds.  Musculoskeletal:     Right lower leg: No edema.     Left lower leg: No edema.  Skin:    Findings: Rash present.     Comments: He has macular slightly scaly fairly well-demarcated rash groin region bilaterally.  Few satellite lesions noted.  Neurological:     Mental Status: He is alert.      Results for orders placed or performed in visit on 05/27/23  POC HgB A1c  Result Value Ref Range   Hemoglobin A1C 5.7 (A) 4.0 - 5.6 %   HbA1c POC (<> result, manual entry)     HbA1c, POC (prediabetic range)     HbA1c, POC (controlled diabetic range)      Last CBC Lab Results  Component Value Date   WBC 7.3 03/29/2023   HGB 11.3 (L) 03/29/2023   HCT 35.8 (L) 03/29/2023   MCV 91.3 03/29/2023   MCH 34.3 (H) 11/05/2022   RDW 26.0 (H) 03/29/2023   PLT 234.0 03/29/2023   Last metabolic panel Lab Results  Component Value Date   GLUCOSE 108 (H) 04/28/2023   NA 143 04/28/2023   K 3.6 04/28/2023   CL 106 04/28/2023   CO2 30  04/28/2023   BUN 9 04/28/2023   CREATININE 0.72 04/28/2023   GFR 91.06 04/28/2023   CALCIUM 9.8 04/28/2023   PHOS 3.2 03/20/2021   PROT 7.0 01/25/2023   ALBUMIN 4.1 01/25/2023   LABGLOB 2.7 06/11/2022   AGRATIO 1.6 06/11/2022   BILITOT 0.7 01/25/2023   ALKPHOS 61 01/25/2023   AST 19 01/25/2023   ALT 12 01/25/2023   ANIONGAP 9 01/27/2022   Last lipids Lab Results  Component Value Date   CHOL 92 01/25/2023   HDL 38.50 (L) 01/25/2023   LDLCALC 31 01/25/2023   LDLDIRECT 124.0 06/23/2018   TRIG 115.0 01/25/2023   CHOLHDL 2 01/25/2023   Last hemoglobin A1c Lab Results  Component Value Date   HGBA1C 5.7 (A) 05/27/2023   Last thyroid functions Lab Results  Component Value Date   TSH 2.13 01/25/2023      The ASCVD Risk score (Arnett DK, et al., 2019) failed to calculate for the following reasons:   The valid total cholesterol range is 130 to 320 mg/dL    Assessment & Plan:   1 type 2 diabetes well-controlled with A1c is 5.7%.  We discussed that we probably could scale back his metformin but he is currently on Synjardy which is a combination product.  Would like to maintain his Jardiance with history of heart failure.  Continue current medication and recheck A1c within 4 to 6 months  #2 groin rash.  Suspect tinea cruris.  Keep area dry as possible.  Try Lotrimin cream twice daily and be in touch if not clearing in a few weeks  #3 history of Warnicke encephalopathy.  He has had progressive issues with swallowing.  Had swallowing study back in 2022.  Has prickly having difficulties with liquids.  They would like to get back into see neurology and we will try to set up that referral  #4 insomnia.  Sleep hygiene discussed.  Weeks discussed portance of total alcohol abstinence which she has not been willing to consider in the past.  Reluctant to use sedative hypnotics.  He already has tremendous risk of falling.  Evelena Peat, MD

## 2023-05-27 NOTE — Patient Instructions (Signed)
Try over the counter Lotrimin cream.

## 2023-06-02 ENCOUNTER — Other Ambulatory Visit: Payer: Self-pay | Admitting: Family Medicine

## 2023-06-08 ENCOUNTER — Other Ambulatory Visit: Payer: Self-pay | Admitting: Family Medicine

## 2023-06-12 ENCOUNTER — Other Ambulatory Visit: Payer: Self-pay | Admitting: Family Medicine

## 2023-06-12 DIAGNOSIS — E1142 Type 2 diabetes mellitus with diabetic polyneuropathy: Secondary | ICD-10-CM

## 2023-06-13 DIAGNOSIS — E119 Type 2 diabetes mellitus without complications: Secondary | ICD-10-CM | POA: Diagnosis not present

## 2023-06-26 ENCOUNTER — Other Ambulatory Visit: Payer: Self-pay | Admitting: Family Medicine

## 2023-07-01 ENCOUNTER — Other Ambulatory Visit: Payer: Self-pay | Admitting: Family Medicine

## 2023-07-03 NOTE — Progress Notes (Unsigned)
Cardiology Clinic Note   Patient Name: Adam [name] Date of Encounter: 07/03/2023  Primary Care Provider:  Kristian Covey, MD Primary Cardiologist:  Peter Swazil[place], MD  Patient Profile    72 year old male with history of coronary artery disease, multiple stent placements, CABG x 6 in [place] in 2011 (LIMA to LAD, RIMA to diagonal, left radial artery to circumflex, SVG to PDA, SVG to PL SA, and questionable SVG.   Persistent atrial fibrillation status post ablation,,he is followed in the A-fib clinic and remains on Eliquis, aortic aneurysm per CTA of the chest September 2023 stable with mild aneurysmal dilatation of the ascending thoracic aorta at 4.2 cm, hypertension, hyperlipidemia, type 2 diabetes, COPD, OSA, GERD, alcohol abuse, and Warnicke's encephalopathy with aphasia.  He has ILR in situ.  The patient is aphasic and communicates with hand gestures and writing on a white board.  Past Medical History    Past Medical History:  Diagnosis Date   Alcohol withdrawal (HCC) 10/24/2019   Allergy    Anxiety    history of PTSD following CABG   Ascending aortic aneurysm (HCC) 01/31/2018   43 x 42 mm, pt unaware   Asthma    Cardiomegaly 10/17/2017   Colitis- colonoscopy 2014 07/13/2015   COPD (chronic obstructive pulmonary disease) (HCC)    Coronary artery disease    x 6   Depression    Diabetes mellitus without complication (HCC)    Family history of polyps in the colon    Finger dislocation    Left pinkie   GERD (gastroesophageal reflux disease)    Gout    H/O atrial fibrillation without current medication    following CABG with no documented episodes since then.   Heart palpitations    Hx of adenomatous colonic polyps 08/12/2010   Hyperlipidemia    Hypertension    OA (osteoarthritis)    OSA (obstructive sleep apnea)    Mild, has not received CPAP yet   Prediabetes    Protein-calorie malnutrition, severe 03/01/2020   RLS (restless legs syndrome)    Squamous cell  carcinoma of scalp 2016   Moh's   Past Surgical History:  Procedure Laterality Date   ANKLE FRACTURE SURGERY Right 1991   APPENDECTOMY     ATRIAL FIBRILLATION ABLATION N/A 03/31/2018   Procedure: ATRIAL FIBRILLATION ABLATION;  Surgeon: Hillis Range, MD;  Location: [facility];  Service: Cardiovascular;  Laterality: N/A;   CARDIAC ELECTROPHYSIOLOGY MAPPING AND ABLATION     CARDIOVERSION N/A 03/23/2021   Procedure: CARDIOVERSION;  Surgeon: Elease Hashimoto Deloris Ping, MD;  Location: [facility] ENDOSCOPY;  Service: Cardiovascular;  Laterality: N/A;   COLONOSCOPY  06/11/2021   COLONOSCOPY W/ BIOPSIES  2017   x7   CORONARY ANGIOPLASTY WITH STENT PLACEMENT     CORONARY ARTERY BYPASS GRAFT     FINGER SURGERY  04/2018   Small finger left hand   implantable loop recorder placement  07/02/2019   Medtronic Reveal [place] model LNQ11 (SN [id number]) implanted in office by Dr Johney Frame   TEE WITHOUT CARDIOVERSION N/A 03/30/2018   Procedure: TRANSESOPHAGEAL ECHOCARDIOGRAM (TEE);  Surgeon: Thurmon Fair, MD;  Location: [facility] ENDOSCOPY;  Service: Cardiovascular;  Laterality: N/A;   TEE WITHOUT CARDIOVERSION N/A 03/23/2021   Procedure: TRANSESOPHAGEAL ECHOCARDIOGRAM (TEE);  Surgeon: Elease Hashimoto Deloris Ping, MD;  Location: [facility] ENDOSCOPY;  Service: Cardiovascular;  Laterality: N/A;   TOTAL HIP ARTHROPLASTY Left    TOTAL HIP ARTHROPLASTY Right 09/12/2018   Procedure: TOTAL HIP ARTHROPLASTY ANTERIOR APPROACH;  Surgeon: Charlann Boxer,  Molli Hazard, MD;  Location: [facility] ORS;  Service: Orthopedics;  Laterality: Right;  70 mins    Allergies  Allergies  Allergen Reactions   Amoxicillin Rash and Other (See Comments)    RASH IN GROIN AREA Has patient had a PCN reaction causing immediate rash, facial/tongue/throat swelling, SOB or lightheadedness with hypotension: no Has patient had a PCN reaction causing severe rash involving mucus membranes or skin necrosis: no Has patient had a PCN reaction that required hospitalization: no Has patient had a PCN  reaction occurring within the last 10 years: yes If all of the above answers are "NO", then may proceed with Cephalosporin use.    Augmentin [Amoxicillin-Pot Clavulanate] Rash and Other (See Comments)    RASH IN GROIN AREA   Azithromycin Rash and Other (See Comments)    RASH IN GROIN AREA   Clindamycin/Lincomycin Rash   Keflex [Cephalexin] Rash   Xarelto [Rivaroxaban] Other (See Comments)    Patient stated he "ended up in the hospital with a rectal bleed"    History of Present Illness    Mr. Caprerol returns to the office today for ongoing assessment and management of coronary artery disease, hyperlipidemia, history of persistent atrial fibrillation followed by A-fib clinic, thoracic aortic aneurysm, and hypertension.  Last seen in the office on 01/06/2023 and was stable from a cardiac standpoint no further testing or medication changes were made.  Home Medications    Current Outpatient Medications  Medication Sig Dispense Refill   allopurinol (ZYLOPRIM) 300 MG tablet TAKE 1 TABLET(300 MG) BY MOUTH DAILY 90 tablet 0   amiodarone (PACERONE) 200 MG tablet TAKE 1 TABLET(200 MG) BY MOUTH DAILY 90 tablet 2   apixaban (ELIQUIS) 5 MG TABS tablet TAKE 1 TABLET(5 MG) BY MOUTH TWICE DAILY 180 tablet 2   B COMPLEX-C-FOLIC ACID PO Take 1 tablet by mouth daily.     Continuous Blood Gluc Sensor (DEXCOM G7 SENSOR) MISC by Does not apply route. Every 10 days     DULoxetine (CYMBALTA) 60 MG capsule Take one capsule by mouth once daily 90 capsule 3   gabapentin (NEURONTIN) 300 MG capsule TAKE 2 CAPSULES BY MOUTH AT NIGHT AS NEEDED FOR RESTLESS LEGS OR SYMPTOMS 180 capsule 0   HM LIDOCAINE PATCH EX Apply 1 patch topically as needed (pain).     lisinopril (ZESTRIL) 20 MG tablet TAKE 1 TABLET(20 MG) BY MOUTH DAILY 90 tablet 0   Magnesium Oxide 400 MG CAPS Take 1 capsule (400 mg total) by mouth daily. 90 capsule 1   metoprolol tartrate (LOPRESSOR) 50 MG tablet TAKE 1 TABLET(50 MG) BY MOUTH TWICE DAILY 180  tablet 1   montelukast (SINGULAIR) 10 MG tablet Take 1 tablet (10 mg total) by mouth at bedtime. 90 tablet 0   rosuvastatin (CRESTOR) 40 MG tablet TAKE 1 TABLET(40 MG) BY MOUTH DAILY 90 tablet 2   SKYRIZI PEN 150 MG/ML SOAJ Inject into the skin. Takes every 3 months     sodium chloride (OCEAN) 0.65 % SOLN nasal spray Place 1 spray into both nostrils as needed for congestion.     Spacer/Aero-Holding Chambers DEVI Use with inhaler 1 each 1   SYNJARDY XR 25-1000 MG TB24 TAKE 1 TABLET BY MOUTH DAILY 90 tablet 0   thiamine (VITAMIN B-1) 100 MG tablet Take 500 mg by mouth daily.     No current facility-administered medications for this visit.     Family History    Family History  Problem Relation Age of Onset   Colon cancer  Mother    Cancer Father        UNKNOWN TYPE   Heart disease Paternal Grandmother    Heart disease Paternal Grandfather    Esophageal cancer Neg Hx    Rectal cancer Neg Hx    Stomach cancer Neg Hx    He indicated that his mother is deceased. He indicated that his father is deceased. He indicated that his maternal grandmother is deceased. He indicated that his maternal grandfather is deceased. He indicated that his paternal grandmother is deceased. He indicated that his paternal grandfather is deceased. He indicated that the status of his neg hx is unknown.  Social History    Social History   Socioeconomic History   Marital status: Married    Spouse name: Not on file   Number of children: 0   Years of education: Not on file   Highest education level: Not on file  Occupational History   Occupation: retired  Tobacco Use   Smoking status: Former    Current packs/day: 0.00    Average packs/day: 1 pack/day for 7.0 years (7.0 ttl pk-yrs)    Types: Cigarettes    Start date: 10/04/1970    Quit date: 10/04/1977    Years since quitting: 45.7   Smokeless tobacco: Never   Tobacco comments:    Former smoker 01/26/23  Vaping Use   Vaping status: Never Used  Substance and  Sexual Activity   Alcohol use: Yes    Alcohol/week: 3.0 - 4.0 standard drinks of alcohol    Types: 1 Glasses of wine, 2 - 3 Shots of liquor per week    Comment: 4 drinks daily 01/26/23   Drug use: No    Comment: Smoked Marijuana back in 1970s   Sexual activity: Yes  Other Topics Concern   Not on file  Social History Narrative   Married, no children   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 - returned to [employer] from [place] 2014   Enjoys bird watching, reading historical books about WWII, watching tv   Social Determinants of Health   Financial Resource Strain: Low Risk  (01/05/2023)   Overall Financial Resource Strain (CARDIA)    Difficulty of Paying Living Expenses: Not hard at all  Food Insecurity: No Food Insecurity (01/05/2023)   Hunger Vital Sign    Worried About Running Out of Food in the Last Year: Never true    Ran Out of Food in the Last Year: Never true  Transportation Needs: No Transportation Needs (01/05/2023)   PRAPARE - Administrator, Civil Service (Medical): No    Lack of Transportation (Non-Medical): No  Physical Activity: Inactive (01/05/2023)   Exercise Vital Sign    Days of Exercise per Week: 0 days    Minutes of Exercise per Session: 0 min  Stress: No Stress Concern Present (01/05/2023)   [employer] of Occupational Health - Occupational Stress Questionnaire    Feeling of Stress : Not at all  Social Connections: Moderately Isolated (01/05/2023)   Social Connection and Isolation Panel [NHANES]    Frequency of Communication with Friends and Family: More than three times a week    Frequency of Social Gatherings with Friends and Family: More than three times a week    Attends Religious Services: Never    Database administrator or Organizations: No    Attends Banker Meetings: Never    Marital Status: Married  Catering manager Violence:  Not At Risk (01/05/2023)   Humiliation, Afraid, Rape, and Kick  questionnaire    Fear of Current or Ex-Partner: No    Emotionally Abused: No    Physically Abused: No    Sexually Abused: No     Review of Systems    General:  No chills, fever, night sweats or weight changes.  Cardiovascular:  No chest pain, dyspnea on exertion, edema, orthopnea, palpitations, paroxysmal nocturnal dyspnea. Dermatological: No rash, lesions/masses Respiratory: No cough, dyspnea Urologic: No hematuria, dysuria Abdominal:   No nausea, vomiting, diarrhea, bright red blood per rectum, melena, or hematemesis Neurologic:  No visual changes, wkns, changes in mental status. All other systems reviewed and are otherwise negative except as noted above.       Physical Exam    VS:  There were no vitals taken for this visit. , BMI There is no height or weight on file to calculate BMI.     GEN: Well nourished, well developed, in no acute distress. HEENT: normal. Neck: Supple, no JVD, carotid bruits, or masses. Cardiac: RRR, no murmurs, rubs, or gallops. No clubbing, cyanosis, edema.  Radials/DP/PT 2+ and equal bilaterally.  Respiratory:  Respirations regular and unlabored, clear to auscultation bilaterally. GI: Soft, nontender, nondistended, BS + x 4. MS: no deformity or atrophy. Skin: warm and dry, no rash. Neuro:  Strength and sensation are intact. Psych: Normal affect.      Lab Results  Component Value Date   WBC 7.3 03/29/2023   HGB 11.3 (L) 03/29/2023   HCT 35.8 (L) 03/29/2023   MCV 91.3 03/29/2023   PLT 234.0 03/29/2023   Lab Results  Component Value Date   CREATININE 0.72 04/28/2023   BUN 9 04/28/2023   NA 143 04/28/2023   K 3.6 04/28/2023   CL 106 04/28/2023   CO2 30 04/28/2023   Lab Results  Component Value Date   ALT 12 01/25/2023   AST 19 01/25/2023   ALKPHOS 61 01/25/2023   BILITOT 0.7 01/25/2023   Lab Results  Component Value Date   CHOL 92 01/25/2023   HDL 38.50 (L) 01/25/2023   LDLCALC 31 01/25/2023   LDLDIRECT 124.0 06/23/2018   TRIG  115.0 01/25/2023   CHOLHDL 2 01/25/2023    Lab Results  Component Value Date   HGBA1C 5.7 (A) 05/27/2023     Review of Prior Studies    TEE 03/24/2023 1. Left ventricular ejection fraction, by estimation, is 45 to 50%. The  left ventricle has mildly decreased function.   2. Right ventricular systolic function is normal. The right ventricular  size is not well visualized.   3. No left atrial/left atrial appendage thrombus was detected.   4. The mitral valve is grossly normal. Mild to moderate mitral valve  regurgitation.   5. The aortic valve is normal in structure. Aortic valve regurgitation is  not visualized.   6. Evidence of atrial level shunting detected by color flow Doppler.   Assessment & Plan   1.  ***     {Are you ordering a CV Procedure (e.g. stress test, cath, DCCV, TEE, etc)?   Press F2        :[id number]}   Signed, Bettey Mare. Liborio Nixon, ANP, AACC   07/03/2023 3:49 PM      Office [phone] Fax [phone]  Notice: This dictation was prepared with Dragon dictation along with smaller phrase technology. Any transcriptional errors that result from this process are unintentional and may not be corrected upon review.

## 2023-07-04 ENCOUNTER — Ambulatory Visit (INDEPENDENT_AMBULATORY_CARE_PROVIDER_SITE_OTHER): Payer: Medicare Other

## 2023-07-04 DIAGNOSIS — Z23 Encounter for immunization: Secondary | ICD-10-CM

## 2023-07-05 ENCOUNTER — Encounter: Payer: Self-pay | Admitting: Adult Health

## 2023-07-05 ENCOUNTER — Ambulatory Visit: Payer: Medicare Other | Attending: Adult Health | Admitting: Adult Health

## 2023-07-05 VITALS — BP 120/60 | HR 56 | Ht 70.0 in | Wt 172.0 lb

## 2023-07-05 DIAGNOSIS — I10 Essential (primary) hypertension: Secondary | ICD-10-CM

## 2023-07-05 DIAGNOSIS — I2510 Atherosclerotic heart disease of native coronary artery without angina pectoris: Secondary | ICD-10-CM | POA: Diagnosis not present

## 2023-07-05 DIAGNOSIS — F802 Mixed receptive-expressive language disorder: Secondary | ICD-10-CM

## 2023-07-05 DIAGNOSIS — D6869 Other thrombophilia: Secondary | ICD-10-CM

## 2023-07-05 DIAGNOSIS — I480 Paroxysmal atrial fibrillation: Secondary | ICD-10-CM | POA: Diagnosis not present

## 2023-07-05 DIAGNOSIS — I4819 Other persistent atrial fibrillation: Secondary | ICD-10-CM

## 2023-07-05 NOTE — Patient Instructions (Signed)
Medication Instructions:  No Changes *If you need a refill on your cardiac medications before your next appointment, please call your pharmacy*   Lab Work: No Labs If you have labs (blood work) drawn today and your tests are completely normal, you will receive your results only by: MyChart Message (if you have MyChart) OR A paper copy in the mail If you have any lab test that is abnormal or we need to change your treatment, we will call you to review the results.   Testing/Procedures: No Testing   Follow-Up: At [facility], you and your health needs are our priority.  As part of our continuing mission to provide you with exceptional heart care, we have created designated Provider Care Teams.  These Care Teams include your primary Cardiologist (physician) and Advanced Practice Providers (APPs -  Physician Assistants and Nurse Practitioners) who all work together to provide you with the care you need, when you need it.  We recommend signing up for the patient portal called "MyChart".  Sign up information is provided on this After Visit Summary.  MyChart is used to connect with patients for Virtual Visits (Telemedicine).  Patients are able to view lab/test results, encounter notes, upcoming appointments, etc.  Non-urgent messages can be sent to your provider as well.   To learn more about what you can do with MyChart, go to [email or web address].    Your next appointment:   6 month(s)  Provider:   Peter [name], MD      

## 2023-07-28 ENCOUNTER — Encounter (HOSPITAL_COMMUNITY): Payer: Self-pay | Admitting: Physician Assistant

## 2023-07-28 ENCOUNTER — Ambulatory Visit (HOSPITAL_COMMUNITY)
Admission: RE | Admit: 2023-07-28 | Discharge: 2023-07-28 | Disposition: A | Discharge status: Home / Self Care | Payer: Medicare Other | Source: Ambulatory Visit | Attending: Physician Assistant | Admitting: Physician Assistant

## 2023-07-28 VITALS — BP 98/70 | HR 62 | Ht 70.0 in | Wt 172.4 lb

## 2023-07-28 DIAGNOSIS — I10 Essential (primary) hypertension: Secondary | ICD-10-CM | POA: Diagnosis not present

## 2023-07-28 DIAGNOSIS — Z79899 Other long term (current) drug therapy: Secondary | ICD-10-CM | POA: Diagnosis not present

## 2023-07-28 DIAGNOSIS — R4701 Aphasia: Secondary | ICD-10-CM | POA: Diagnosis not present

## 2023-07-28 DIAGNOSIS — E512 Wernicke's encephalopathy: Secondary | ICD-10-CM | POA: Diagnosis not present

## 2023-07-28 DIAGNOSIS — Z7901 Long term (current) use of anticoagulants: Secondary | ICD-10-CM | POA: Insufficient documentation

## 2023-07-28 DIAGNOSIS — E785 Hyperlipidemia, unspecified: Secondary | ICD-10-CM | POA: Diagnosis not present

## 2023-07-28 DIAGNOSIS — Z951 Presence of aortocoronary bypass graft: Secondary | ICD-10-CM | POA: Diagnosis not present

## 2023-07-28 DIAGNOSIS — I2510 Atherosclerotic heart disease of native coronary artery without angina pectoris: Secondary | ICD-10-CM | POA: Diagnosis not present

## 2023-07-28 DIAGNOSIS — D6869 Other thrombophilia: Secondary | ICD-10-CM | POA: Insufficient documentation

## 2023-07-28 DIAGNOSIS — I4819 Other persistent atrial fibrillation: Secondary | ICD-10-CM | POA: Insufficient documentation

## 2023-07-28 DIAGNOSIS — G4733 Obstructive sleep apnea (adult) (pediatric): Secondary | ICD-10-CM | POA: Diagnosis not present

## 2023-07-28 DIAGNOSIS — R9431 Abnormal electrocardiogram [ECG] [EKG]: Secondary | ICD-10-CM | POA: Diagnosis not present

## 2023-07-28 DIAGNOSIS — I4891 Unspecified atrial fibrillation: Secondary | ICD-10-CM | POA: Diagnosis not present

## 2023-07-28 DIAGNOSIS — Z5181 Encounter for therapeutic drug level monitoring: Secondary | ICD-10-CM | POA: Insufficient documentation

## 2023-07-28 DIAGNOSIS — E119 Type 2 diabetes mellitus without complications: Secondary | ICD-10-CM | POA: Insufficient documentation

## 2023-07-28 DIAGNOSIS — Z9181 History of falling: Secondary | ICD-10-CM | POA: Insufficient documentation

## 2023-07-28 LAB — COMPREHENSIVE METABOLIC PANEL
ALT: 20 U/L (ref 0–44)
AST: 28 U/L (ref 15–41)
Albumin: 3.5 g/dL (ref 3.5–5.0)
Alkaline Phosphatase: 48 U/L (ref 38–126)
Anion gap: 9 (ref 5–15)
BUN: 14 mg/dL (ref 8–23)
CO2: 24 mmol/L (ref 22–32)
Calcium: 9.6 mg/dL (ref 8.9–10.3)
Chloride: 108 mmol/L (ref 98–111)
Creatinine, Ser: 1.04 mg/dL (ref 0.61–1.24)
GFR, Estimated: >60 mL/min (ref >60)
Glucose, Bld: 115 mg/dL — ABNORMAL HIGH (ref 70–99)
Potassium: 3.2 mmol/L — ABNORMAL LOW (ref 3.5–5.1)
Sodium: 141 mmol/L (ref 135–145)
Total Bilirubin: 1.2 mg/dL (ref 0.3–1.2)
Total Protein: 6.6 g/dL (ref 6.5–8.1)

## 2023-07-28 LAB — TSH: TSH: 2.122 u[IU]/mL (ref 0.350–4.500)

## 2023-07-28 NOTE — Progress Notes (Signed)
Primary Care Physician: Kristian Covey, MD Referring Physician: Dr. Johney Frame Primary Cardiologist: Dr [place]  Adam [name] is a 72 y.o. male with a h/o  CABG in 2011, done in [place], HTN, HLD, DM, Atrial fibrillation,alcohol use, and OSA. He underwent catheter ablation for atrial fibrillation by Dr. Johney Frame 03/31/18. He was followed by Dr. Mayford Knife in sleep clinic, but unable to tolerate CPAP and turned in his equipment. He was previously on Xarelto for a CHADS2VASC score of 3. Patient was admitted 10/24/19 for GI bleeding and alcohol withdrawal. EP was consulted to assist with dofetilide reloading. His Xarelto was held on admission and was not resumed at discharge, he was eventually placed back on eliquis.  He was discharged to rehab for a prolonged period of time for Wernicke's encephalopathy.   He is now back in the afib clinic for pt feeling rapid heart rate.. By his device, he had had 3 episodes of afib.Fastest rate was 146 bpm, with longest duration of 44 mins. Recent burden was actually improved at 9.4%, previously at 14%. Presenting rhythm for the interpretation was in rapid a  Flutter and he continues in this rhythm today. He continues o drink alcohol, when asked the amount, he states "enough". He continues to have untreated OSA.   F/u in afib clinic, 03/31/21. He had a hospitalization for afib with [facility], 6-17 thru 6-21, worsened with not taking cardizem for 3 days as the drug store was out of it per pt. He was having ongoing alcohol abuse. Last alcohol was that am, wine with breakfast. He had some slurred speech, left facial droop that had occurred days earlier, PCP obtained MRI, negative for stroke.EP was consulted doe RVR, cardizem drip started and the decision was made to stop Tikosyn and start amio load. Pt was on amio in  the remote past, had issues with skin discoloration but EP did not see any alternative drug options. Hospitalization was complicated by alcohol withdrawal.   In the office  today, afib is rate controlled. He continues on amiodarone 400 mg bid, x 2 weeks. He states he has missed a few doses of DOAC, reminded not to do this with pending cardioversion. He feels improved.   F/u in afib clinic 04/21/21 after an ER visit for palpitations and found to be in afib. He had missed several dose of amiodarone and eliquis the week before. Planned for reloading of amiodarone and sent home. He is in SR today. He has been on 200 mg bid for the last 2 weeks. He will lower to 200 mg daily now. His is describing some constipation with amiodarone.   F/u in afib clinic, 07/30/21 for surveillance of amiodarone. He remains in SR but with a prolonged qtc at 592 ms. He denies any new drugs or benadryl use. His wife states that he intake of alcohol has worsened. He has fallen numerous times and his speech is slurred today. This has been getting worse over several  weeks time. He saw his PCP last week, had same symptoms and was  referred to neurology, appointment next week. He states he drinks 3-4 beverage glass size mixed  scotch drinks a day. He has not noted any afib.   F/u in afib clinic, 01/27/22. He is in the clinic for amiodarone surveillance. He indicates that he has not noted any afib. He is in SR today. The biggest change I see today is that his speech is very garbled, only being able to make out a few words. His  wife states that it is progression of  Wernicke's encephalopathy. He continues  with abuse alcohol. He has occasional falls. He is requesting imaging of his aortic aneurysm be done. It has been almost 2 years insce last CT. Dr. Mayford Knife ordered it  last but he no longer sees her as he turned in his CPAP and does not treat his afib.    F/u in afib clinic, 07/29/22. He is almost non verbal now from  the consequences of his alcoholism and  Wernicke's encephalopathy. He continues  on amiodarone and EKG shows SR. He has not had any afib that he is aware of. He is here with his wife and is very  alert, answers questions with non verbal hand/facial movements.      Follow up in the AF clinic 01/26/23. Patient reports that he has done well from an afib standpoint. He has not had any afib symptoms and his ILR showed 0% afib burden. He has been more fatigued recently. He denies any bleeding issues on anticoagulation, no black stools.   Follow up in the AF clinic 07/28/23. Patient reports that he has done well from an afib standpoint. He has not had any interim symptoms of afib. He has fallen at home and has several bruises and scrapes on his arms.   Today, he denies symptoms of palpitations, chest pain, mild increase of shortness of breath and fatigue,  orthopnea, PND, lower extremity edema, dizziness, presyncope, syncope, or neurologic sequela. The patient is tolerating medications without difficulties and is otherwise without complaint today.   Past Medical History:  Diagnosis Date   Alcohol withdrawal (HCC) 10/24/2019   Allergy    Anxiety    history of PTSD following CABG   Ascending aortic aneurysm (HCC) 01/31/2018   43 x 42 mm, pt unaware   Asthma    Cardiomegaly 10/17/2017   Colitis- colonoscopy 2014 07/13/2015   COPD (chronic obstructive pulmonary disease) (HCC)    Coronary artery disease    x 6   Depression    Diabetes mellitus without complication (HCC)    Family history of polyps in the colon    Finger dislocation    Left pinkie   GERD (gastroesophageal reflux disease)    Gout    H/O atrial fibrillation without current medication    following CABG with no documented episodes since then.   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    Current Outpatient Medications  Medication  Sig Dispense Refill   allopurinol (ZYLOPRIM) 300 MG tablet TAKE 1 TABLET(300 MG) BY MOUTH DAILY 90 tablet 0   amiodarone (PACERONE) 200 MG tablet TAKE 1 TABLET(200 MG) BY MOUTH DAILY 90 tablet 2   apixaban (ELIQUIS) 5 MG TABS tablet TAKE 1 TABLET(5 MG) BY MOUTH TWICE DAILY 180 tablet 2   DULoxetine (CYMBALTA) 60 MG capsule Take one capsule by mouth once daily 90 capsule 3   gabapentin (NEURONTIN) 300 MG capsule TAKE 2 CAPSULES BY MOUTH AT NIGHT AS NEEDED FOR RESTLESS LEGS OR SYMPTOMS 180 capsule 0   HM LIDOCAINE PATCH EX Apply 1 patch topically as needed (pain).     lisinopril (ZESTRIL) 20 MG tablet  TAKE 1 TABLET(20 MG) BY MOUTH DAILY 90 tablet 0   Magnesium Oxide 400 MG CAPS Take 1 capsule (400 mg total) by mouth daily. 90 capsule 1   metoprolol tartrate (LOPRESSOR) 50 MG tablet TAKE 1 TABLET(50 MG) BY MOUTH TWICE DAILY 180 tablet 1   montelukast (SINGULAIR) 10 MG tablet Take 1 tablet (10 mg total) by mouth at bedtime. 90 tablet 0   PREVIDENT 5000 ENAMEL PROTECT 1.1-5 % GEL Take by mouth.     rosuvastatin (CRESTOR) 40 MG tablet TAKE 1 TABLET(40 MG) BY MOUTH DAILY 90 tablet 2   SKYRIZI PEN 150 MG/ML SOAJ Inject into the skin. Takes every 3 months     sodium chloride (OCEAN) 0.65 % SOLN nasal spray Place 1 spray into both nostrils as needed for congestion.     Spacer/Aero-Holding Chambers DEVI Use with inhaler 1 each 1   SYNJARDY XR 25-1000 MG TB24 TAKE 1 TABLET BY MOUTH DAILY 90 tablet 0   thiamine (VITAMIN B-1) 100 MG tablet Take 500 mg by mouth daily.     triamcinolone cream (KENALOG) 0.1 % SMARTSIG:1 Application Topical 2-3 Times Daily     No current facility-administered medications for this encounter.    ROS- All systems are reviewed and negative except as per the HPI above  Physical Exam: Vitals:   07/28/23 1103  BP: 98/70  Pulse: 62  Weight: 78.2 kg  Height: 5\' 10"  (1.778 m)    Wt Readings from Last 3 Encounters:  07/28/23 78.2 kg  07/05/23 78 kg  05/27/23 80.8 kg     GEN: Well nourished, well developed in no acute distress NECK: No JVD; No carotid bruits CARDIAC: Regular rate and rhythm, no murmurs, rubs, gallops RESPIRATORY:  Clear to auscultation without rales, wheezing or rhonchi  ABDOMEN: Soft, non-tender, non-distended EXTREMITIES:  No edema; No deformity  Neuro: aphasic    ECG today demonstrates SR, 1st degree AV block Vent. rate 62 BPM PR interval 208 ms QRS duration 88 ms QT/QTcB 478/485 ms   Epic records reviewed    CHA2DS2-VASc Score = 4  The patient's score is based upon: CHF History: 0 HTN History: 1 Diabetes History: 1 Stroke History: 0 Vascular Disease History: 1 Age Score: 1 Gender Score: 0       ASSESSMENT AND PLAN: Persistent Atrial Fibrillation (ICD10:  I48.19) The patient's CHA2DS2-VASc score is 4, indicating a 4.8% annual risk of stroke.   S/p ablation 03/2018. Tikosyn stopped and amiodarone was started 03/31/21 ILR now at [facility], patient wants to leave the device implanted.  Continue amiodarone 200 mg daily Continue Eliquis 5 mg BID Check cmet/TSH today Continue metoprolol tartrate 50 mg BID  Secondary Hypercoagulable State (ICD10:  D68.69) The patient is at significant risk for stroke/thromboembolism based upon his CHA2DS2-VASc Score of 4.  Continue Apixaban (Eliquis). Patient concerned about his bleeding risk with his h/o falls. Watchman brochure provided today. He would like to think about it before a referral is sent.   OSA  Encouraged nightly CPAP  CAD No anginal symptoms  HTN Stable on current regimen  Wernicke's encephalopathy Aphasic  Communicates with gestures and writing.  Per wife, he has a f/u with neurology to be tested for ALS.    Follow up with Dr [place] as scheduled. AF clinic in one year.    Jorja Loa PA-C Af[facility] [address] [place], [place] [address] [phone]

## 2023-08-15 DIAGNOSIS — B353 Tinea pedis: Secondary | ICD-10-CM | POA: Diagnosis not present

## 2023-08-15 DIAGNOSIS — R208 Other disturbances of skin sensation: Secondary | ICD-10-CM | POA: Diagnosis not present

## 2023-08-15 DIAGNOSIS — L570 Actinic keratosis: Secondary | ICD-10-CM | POA: Diagnosis not present

## 2023-08-15 DIAGNOSIS — L538 Other specified erythematous conditions: Secondary | ICD-10-CM | POA: Diagnosis not present

## 2023-08-15 DIAGNOSIS — L400 Psoriasis vulgaris: Secondary | ICD-10-CM | POA: Diagnosis not present

## 2023-08-15 DIAGNOSIS — Z789 Other specified health status: Secondary | ICD-10-CM | POA: Diagnosis not present

## 2023-08-15 DIAGNOSIS — L820 Inflamed seborrheic keratosis: Secondary | ICD-10-CM | POA: Diagnosis not present

## 2023-08-17 ENCOUNTER — Encounter: Payer: Self-pay | Admitting: Neurology

## 2023-08-17 ENCOUNTER — Ambulatory Visit (INDEPENDENT_AMBULATORY_CARE_PROVIDER_SITE_OTHER): Payer: Medicare Other | Admitting: Neurology

## 2023-08-17 VITALS — BP 129/78 | HR 60 | Ht 70.0 in | Wt 170.5 lb

## 2023-08-17 DIAGNOSIS — R296 Repeated falls: Secondary | ICD-10-CM | POA: Diagnosis not present

## 2023-08-17 DIAGNOSIS — R4701 Aphasia: Secondary | ICD-10-CM

## 2023-08-17 DIAGNOSIS — E512 Wernicke's encephalopathy: Secondary | ICD-10-CM

## 2023-08-17 DIAGNOSIS — G1229 Other motor neuron disease: Secondary | ICD-10-CM

## 2023-08-17 DIAGNOSIS — R1310 Dysphagia, unspecified: Secondary | ICD-10-CM

## 2023-08-17 NOTE — Progress Notes (Unsigned)
[facility]  PATIENT: Adam [name] DOB: [birth year]  REFERRING CLINICIAN: Kristian Covey, MD HISTORY FROM: Patient and spouse [name] REASON FOR VISIT: Slurred speech/ Balance issues and multiple falls.    HISTORICAL  CHIEF COMPLAINT:  Chief Complaint  Patient presents with   New Patient (Initial Visit)    Rm12, wife present, internal referral for Wernicke encephalopathy and balance concerns:nonverbal, trouble swallowing, weightloss, frequent falls (screening completed)   INTERVAL HISTORY 08/17/2023: STILL drinks    HISTORY OF PRESENT ILLNESS:  This is a 72 year old gentleman with a history of chronic alcohol abuse, COPD, heart disease, gout, depression, diabetes mellitus and, hyperlipidemia and recently diagnosed with Wernicke encephalopathy last year, and after discharged, was in rehab for 73-month and has been home since December 2021.  Patient said that since being home, he has been having multiple falls, had difficulty with speech, that he described as slurred speech said that he follow-up with his primary care doctor has carotid checked, was told they were fine.  He does complain of incontinence and constipation.  He also reported history of hallucination, on 2 occasions. The first one, he was talking to his brother and the second episode he was talking to his nephew and they were both not in the house.  Patient reported that he is more forgetful, he continued to fall, last fall was a week ago.  Patient also reported he continued to drink, he drinks heavily and daily,, has a eye-opening in the morning and now said that drinking has restarted since being home after his rehab stay.  He has not cut down on his alcohol intake.  He is concerned that he has Lewy body dementia   OTHER MEDICAL CONDITIONS: Chronic alcohol, COPD, Heart disease, DM, Gout, Depression, HLD,    REVIEW OF SYSTEMS: Full 14 system review of systems performed and negative with exception  of: as noted in the HPI   ALLERGIES: Allergies  Allergen Reactions   Amoxicillin Rash and Other (See Comments)    RASH IN GROIN AREA Has patient had a PCN reaction causing immediate rash, facial/tongue/throat swelling, SOB or lightheadedness with hypotension: no Has patient had a PCN reaction causing severe rash involving mucus membranes or skin necrosis: no Has patient had a PCN reaction that required hospitalization: no Has patient had a PCN reaction occurring within the last 10 years: yes If all of the above answers are "NO", then may proceed with Cephalosporin use.    Augmentin [Amoxicillin-Pot Clavulanate] Rash and Other (See Comments)    RASH IN GROIN AREA   Azithromycin Rash and Other (See Comments)    RASH IN GROIN AREA   Clindamycin/Lincomycin Rash   Keflex [Cephalexin] Rash   Xarelto [Rivaroxaban] Other (See Comments)    Patient stated he "ended up in the hospital with a rectal bleed"    HOME MEDICATIONS: Outpatient Medications Prior to Visit  Medication Sig Dispense Refill   allopurinol (ZYLOPRIM) 300 MG tablet TAKE 1 TABLET(300 MG) BY MOUTH DAILY 90 tablet 0   amiodarone (PACERONE) 200 MG tablet TAKE 1 TABLET(200 MG) BY MOUTH DAILY 90 tablet 2   apixaban (ELIQUIS) 5 MG TABS tablet TAKE 1 TABLET(5 MG) BY MOUTH TWICE DAILY 180 tablet 2   DULoxetine (CYMBALTA) 60 MG capsule Take one capsule by mouth once daily 90 capsule 3   gabapentin (NEURONTIN) 300 MG capsule TAKE 2 CAPSULES BY MOUTH AT NIGHT AS NEEDED FOR RESTLESS LEGS OR SYMPTOMS 180 capsule 0   HM LIDOCAINE PATCH EX Apply  1 patch topically as needed (pain).     lisinopril (ZESTRIL) 20 MG tablet TAKE 1 TABLET(20 MG) BY MOUTH DAILY 90 tablet 0   Magnesium Oxide 400 MG CAPS Take 1 capsule (400 mg total) by mouth daily. 90 capsule 1   metoprolol tartrate (LOPRESSOR) 50 MG tablet TAKE 1 TABLET(50 MG) BY MOUTH TWICE DAILY 180 tablet 1   montelukast (SINGULAIR) 10 MG tablet Take 1 tablet (10 mg total) by mouth at bedtime.  90 tablet 0   PREVIDENT 5000 ENAMEL PROTECT 1.1-5 % GEL Take by mouth.     rosuvastatin (CRESTOR) 40 MG tablet TAKE 1 TABLET(40 MG) BY MOUTH DAILY 90 tablet 2   SKYRIZI PEN 150 MG/ML SOAJ Inject into the skin. Takes every 3 months     sodium chloride (OCEAN) 0.65 % SOLN nasal spray Place 1 spray into both nostrils as needed for congestion.     Spacer/Aero-Holding Chambers DEVI Use with inhaler 1 each 1   SYNJARDY XR 25-1000 MG TB24 TAKE 1 TABLET BY MOUTH DAILY 90 tablet 0   thiamine (VITAMIN B-1) 100 MG tablet Take 500 mg by mouth daily.     triamcinolone cream (KENALOG) 0.1 % SMARTSIG:1 Application Topical 2-3 Times Daily     No facility-administered medications prior to visit.    PAST MEDICAL HISTORY: Past Medical History:  Diagnosis Date   Alcohol withdrawal (HCC) 10/24/2019   Allergy    Anxiety    history of PTSD following CABG   Ascending aortic aneurysm (HCC) 01/31/2018   43 x 42 mm, pt unaware   Asthma    Cardiomegaly 10/17/2017   Colitis- colonoscopy 2014 07/13/2015   COPD (chronic obstructive pulmonary disease) (HCC)    Coronary artery disease    x 6   Depression    Diabetes mellitus without complication (HCC)    Family history of polyps in the colon    Finger dislocation    Left pinkie   GERD (gastroesophageal reflux disease)    Gout    H/O atrial fibrillation without current medication    following CABG with no documented episodes since then.   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    PAST SURGICAL HISTORY: Past Surgical History:  Procedure Laterality Date   ANKLE FRACTURE SURGERY Right 1991   APPENDECTOMY     ATRIAL FIBRILLATION ABLATION N/A 03/31/2018    Procedure: ATRIAL FIBRILLATION ABLATION;  Surgeon: Hillis Range, MD;  Location: [facility]VASIVE [facility];  Service: Cardiovascular;  Laterality: N/A;   CARDIAC ELECTROPHYSIOLOGY MAPPING AND ABLATION     CARDIOVERSION N/A 03/23/2021   Procedure: CARDIOVERSION;  Surgeon: Elease Hashimoto Deloris Ping, MD;  Location: [facility] ENDOSCOPY;  Service: Cardiovascular;  Laterality: N/A;   COLONOSCOPY  06/11/2021   COLONOSCOPY W/ BIOPSIES  2017   x7   CORONARY ANGIOPLASTY WITH STENT PLACEMENT     CORONARY ARTERY BYPASS GRAFT     FINGER SURGERY  04/2018   Small finger left hand   implantable loop recorder placement  07/02/2019   Medtronic Reveal Hoagland model LNQ11 (SN [id number]) implanted in office by Dr Johney Frame   TEE WITHOUT CARDIOVERSION N/A 03/30/2018   Procedure: TRANSESOPHAGEAL ECHOCARDIOGRAM (  TEE);  Surgeon: Thurmon Fair, MD;  Location: [facility] ENDOSCOPY;  Service: Cardiovascular;  Laterality: N/A;   TEE WITHOUT CARDIOVERSION N/A 03/23/2021   Procedure: TRANSESOPHAGEAL ECHOCARDIOGRAM (TEE);  Surgeon: Elease Hashimoto Deloris Ping, MD;  Location: [facility] ENDOSCOPY;  Service: Cardiovascular;  Laterality: N/A;   TOTAL HIP ARTHROPLASTY Left    TOTAL HIP ARTHROPLASTY Right 09/12/2018   Procedure: TOTAL HIP ARTHROPLASTY ANTERIOR APPROACH;  Surgeon: Durene Romans, MD;  Location: [facility] ORS;  Service: Orthopedics;  Laterality: Right;  70 mins    FAMILY HISTORY: Family History  Problem Relation Age of Onset   Colon cancer Mother    Cancer Father        UNKNOWN TYPE   Heart disease Paternal Grandmother    Heart disease Paternal Grandfather    Esophageal cancer Neg Hx    Rectal cancer Neg Hx    Stomach cancer Neg Hx     SOCIAL HISTORY: Social History   Socioeconomic History   Marital status: Married    Spouse name: Not on file   Number of children: 0   Years of education: Not on file   Highest education level: Not on file  Occupational History   Occupation: retired  Tobacco Use   Smoking status: Former    Current packs/day: 0.00     Average packs/day: 1 pack/day for 7.0 years (7.0 ttl pk-yrs)    Types: Cigarettes    Start date: 10/04/1970    Quit date: 10/04/1977    Years since quitting: 45.8   Smokeless tobacco: Never   Tobacco comments:    Former smoker 01/26/23  Vaping Use   Vaping status: Never Used  Substance and Sexual Activity   Alcohol use: Yes    Alcohol/week: 3.0 - 4.0 standard drinks of alcohol    Types: 1 Glasses of wine, 2 - 3 Shots of liquor per week    Comment: 4 drinks daily 01/26/23   Drug use: No    Comment: Smoked Marijuana back in 1970s   Sexual activity: Yes  Other Topics Concern   Not on file  Social History Narrative   Married, no children   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 - returned to [employer] from [place] 2014   Enjoys bird watching, reading historical books about WWII, watching tv   Social Determinants of Health   Financial Resource Strain: Low Risk  (01/05/2023)   Overall Financial Resource Strain (CARDIA)    Difficulty of Paying Living Expenses: Not hard at all  Food Insecurity: No Food Insecurity (01/05/2023)   Hunger Vital Sign    Worried About Running Out of Food in the Last Year: Never true    Ran Out of Food in the Last Year: Never true  Transportation Needs: No Transportation Needs (01/05/2023)   PRAPARE - Administrator, Civil Service (Medical): No    Lack of Transportation (Non-Medical): No  Physical Activity: Inactive (01/05/2023)   Exercise Vital Sign    Days of Exercise per Week: 0 days    Minutes of Exercise per Session: 0 min  Stress: No Stress Concern Present (01/05/2023)   [employer] of Occupational Health - Occupational Stress Questionnaire    Feeling of Stress : Not at all  Social Connections: Moderately Isolated (01/05/2023)   Social Connection and Isolation Panel [NHANES]    Frequency of Communication with Friends and Family: More than three times a week    Frequency of Social Gatherings with Friends and  Family: More than three times a week    Attends Religious Services: Never    Active Member of Clubs or Organizations: No    Attends Banker Meetings: Never    Marital Status: Married  Catering manager Violence: Not At Risk (01/05/2023)   Humiliation, Afraid, Rape, and Kick questionnaire    Fear of Current or Ex-Partner: No    Emotionally Abused: No    Physically Abused: No    Sexually Abused: No     PHYSICAL EXAM  GENERAL EXAM/CONSTITUTIONAL: Vitals:  Vitals:   08/17/23 0936  BP: 129/78  Pulse: 60  Weight: 170 lb 8 oz (77.3 kg)  Height: 5\' 10"  (1.778 m)    Body mass index is 24.46 kg/m. Wt Readings from Last 3 Encounters:  08/17/23 170 lb 8 oz (77.3 kg)  07/28/23 172 lb 6.4 oz (78.2 kg)  07/05/23 172 lb (78 kg)   Patient is in no distress; well developed, nourished and groomed; neck is supple. He is diaphoretic and has mild tremors.   EYES: Pupils round and reactive to light, Visual fields full to confrontation, Extraocular movements intacts,   MUSCULOSKELETAL: Gait, strength, tone, movements noted in Neurologic exam below  NEUROLOGIC: MENTAL STATUS:     06/26/2018   11:32 AM 06/08/2017   11:50 AM  MMSE - Mini Mental State Exam  Not completed: -- --   awake, alert, oriented to person, place and time recent and remote memory intact normal attention and concentration Extensive slow and slurred speech, comprehension intact, naming intact fund of knowledge appropriate  CRANIAL NERVE:  2nd, 3rd, 4th, 6th - pupils equal and reactive to light, visual fields full to confrontation, extraocular muscles intact, no nystagmus 5th - facial sensation symmetric 7th - facial strength symmetric 8th - hearing intact 9th - palate elevates symmetrically, uvula midline 11th - shoulder shrug symmetric 12th - tongue protrusion midline  MOTOR:  normal bulk and tone, full strength in the BUE, BLE  SENSORY:  normal and symmetric to light touch, pinprick,  temperature, vibration  COORDINATION:  finger-nose-finger, fine finger movements normal. Mild tremors noted on extension of both arms.   REFLEXES:  deep tendon reflexes present and symmetric  GAIT/STATION:  normal    DIAGNOSTIC DATA (LABS, IMAGING, TESTING) - I reviewed patient records, labs, notes, testing and imaging myself where available.  Lab Results  Component Value Date   WBC 7.3 03/29/2023   HGB 11.3 (L) 03/29/2023   HCT 35.8 (L) 03/29/2023   MCV 91.3 03/29/2023   PLT 234.0 03/29/2023      Component Value Date/Time   NA 141 07/28/2023 1205   NA 143 06/11/2022 0920   K 3.2 (L) 07/28/2023 1205   CL 108 07/28/2023 1205   CO2 24 07/28/2023 1205   GLUCOSE 115 (H) 07/28/2023 1205   BUN 14 07/28/2023 1205   BUN 13 06/11/2022 0920   CREATININE 1.04 07/28/2023 1205   CREATININE 0.93 09/23/2020 1216   CALCIUM 9.6 07/28/2023 1205   PROT 6.6 07/28/2023 1205   PROT 7.1 06/11/2022 0920   ALBUMIN 3.5 07/28/2023 1205   ALBUMIN 4.4 06/11/2022 0920   AST 28 07/28/2023 1205   ALT 20 07/28/2023 1205   ALKPHOS 48 07/28/2023 1205   BILITOT 1.2 07/28/2023 1205   BILITOT 0.7 06/11/2022 0920   GFRNONAA >60 07/28/2023 1205   GFRAA 115 08/22/2020 0921   Lab Results  Component Value Date   CHOL 92 01/25/2023   HDL 38.50 (L) 01/25/2023   LDLCALC 31  01/25/2023   LDLDIRECT 124.0 06/23/2018   TRIG 115.0 01/25/2023   CHOLHDL 2 01/25/2023   Lab Results  Component Value Date   HGBA1C 5.7 (A) 05/27/2023   Lab Results  Component Value Date   VITAMINB12 353 02/09/2020   Lab Results  Component Value Date   TSH 2.122 07/28/2023    MRI Brain  No evidence of acute infarction. Moderate generalized parenchymal atrophy, stable as compared to the brain MRI of 02/10/2020. Ventriculomegaly is also unchanged and favored secondary to global atrophy. However, as before a component of normal pressure hydrocephalus is difficult to exclude and clinical correlation is recommended. Stable  mild chronic small vessel ischemic changes within the cerebral white matter and pons   Swallow evaluation 02/2020 Pt presents with mild pharyngeal dysphagia characterized by a pharyngeal delay which resulted in penetration (PAS 3) of thin liquids via straw when consecutive swallows of used. Aspiration was not observed during the study but aspiration of penetrated material is likely. A regular texture diet with thin liquids is recommended at this time with observance of swallowing precautions. SLP will follow for dysphagia treatment.      ASSESSMENT AND PLAN  72 y.o. year old male with chronic alcoholism, recently diagnosed with Wernicke's encephalopathy, currently still drinking alcohol, hypertension, DM, hyperlipidemia, depression, gout who is presenting with concern of Lewy body dementia.  On exam patient did not exhibit any evidence of dementia, he was able to provide a clear and consistent history and his mental examination is within normal limits.  I have explained to him that even though he had those episodes of hallucinations, he does not have the other features of Lewy body dementia including dementia and other psychiatric issues.  His brain MRI show possibility of normal pressure hydrocephalus.  I explained to the patient that with his incontinence, fall and hallucination he may have normal pressure hydrocephalus that can explain the symptoms.  But I believe the main issue for the patient is his alcohol abuse.  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 The fall may be related to him being intoxicated, his incontinence might be related to him drinking a lot, he continues to drink daily, and I advised patient before we start a work-up for his NPH he needs to be sober, at least cut his alcohol.  I am afraid also with his bulbar palsy, he might have silent aspiration, he did have a swallow evaluation last year, but I will  still recommend him to follow-up with his primary care doctor for a possible repeat swallow evaluation done.  I also advised him to follow-up with Dr. Caryl Never for referral to rehab/alcohol abuse therapy/counselor, AA Meetings,  or professional and they can help him with his alcohol abuse disorder.   No diagnosis found.    PLAN: Continue current medications  Follow up with your PCP for possible swallow evaluation to rule out silent aspiration  Follow up with PCP for referral to alcohol abuse cessation, possibly rehab Return if worse    No orders of the defined types were placed in this encounter.    No orders of the defined types were placed in this encounter.    No follow-ups on file.    Windell Norfolk, MD 08/17/2023, 9:46 AM  [facility] [address] [place], [place] [address] [phone]

## 2023-08-18 ENCOUNTER — Encounter: Payer: Self-pay | Admitting: Neurology

## 2023-08-18 ENCOUNTER — Telehealth: Payer: Self-pay | Admitting: Neurology

## 2023-08-18 MED ORDER — NUEDEXTA 20-10 MG PO CAPS: 1.0000 | ORAL_CAPSULE | Freq: Every day | ORAL | 11 refills | Status: DC

## 2023-08-18 NOTE — Patient Instructions (Addendum)
Start Nuedexta daily Continue your other medications MRI brain without contrast Refer to physical therapy Referral to speech therapy Referral to gastroenterology Continue to follow with PCP return in 6 months or sooner if worse.

## 2023-08-18 NOTE — Telephone Encounter (Signed)
Referral for gastroenterology fax to [facility]. Phone: [phone], Fax: [phone].

## 2023-08-19 ENCOUNTER — Telehealth: Payer: Self-pay | Admitting: Neurology

## 2023-08-19 NOTE — Telephone Encounter (Signed)
[facility] [employer] on authorization for Dextromethorphan-quiNIDine (NUEDEXTA) 20-10 MG capsule

## 2023-08-23 ENCOUNTER — Telehealth: Payer: Self-pay | Admitting: Neurology

## 2023-08-23 ENCOUNTER — Telehealth: Payer: Self-pay

## 2023-08-23 ENCOUNTER — Other Ambulatory Visit (HOSPITAL_COMMUNITY): Payer: Self-pay

## 2023-08-23 NOTE — Telephone Encounter (Signed)
   Insurance is asking the above questions-I believe I answered correctly but want to be certain as I am not familiar with this treatment or diagnosis.

## 2023-08-23 NOTE — Telephone Encounter (Signed)
[employer] medicare Berkley Harvey: [id number] exp. 08/23/23-09/21/23, [place] medicaid NPR sent to GI [phone]

## 2023-08-23 NOTE — Telephone Encounter (Signed)
He does not have AV block or prolonged QT syndrome that I am aware of. Who answered these questions?

## 2023-08-24 NOTE — Telephone Encounter (Signed)
Questions have been corrected and changed to no.   Pharmacy Patient Advocate Encounter   Received notification from Physician's Office that prior authorization for Nuedexta 20-10MG  capsules is required/requested.   Insurance verification completed.   The patient is insured through [facility] .   Per test claim: PA required; PA submitted to above mentioned insurance via CoverMyMeds Key/confirmation #/EOC [id number] Status is pending

## 2023-08-24 NOTE — Telephone Encounter (Signed)
Yes, but we have to correct the answers.

## 2023-08-25 ENCOUNTER — Other Ambulatory Visit: Payer: Self-pay | Admitting: Neurology

## 2023-08-25 DIAGNOSIS — R4701 Aphasia: Secondary | ICD-10-CM

## 2023-08-25 DIAGNOSIS — G1229 Other motor neuron disease: Secondary | ICD-10-CM

## 2023-08-25 NOTE — Telephone Encounter (Signed)
[employer] medicare Berkley Harvey: [id number] exp. 08/25/23-09/23/23

## 2023-08-25 NOTE — Telephone Encounter (Signed)
Will get  CT head instead

## 2023-08-25 NOTE — Telephone Encounter (Signed)
Sent the CT order to GI to schedule. [phone]

## 2023-08-25 NOTE — Telephone Encounter (Signed)
Pt's wife, Mithcell Ozbirn, GI could not schedule the MRI due to pt has a loop recorder. Was advice to have schedule somewhere else.

## 2023-08-25 NOTE — Telephone Encounter (Signed)
Megan from [facility] has called with Medication approval info for Dextromethorphan-quiNIDine (NUEDEXTA) 20-10 MG capsule  From 08-24-23 thru 08-23-24 if there are question a call back # is [phone] option 5

## 2023-08-26 ENCOUNTER — Encounter: Payer: Self-pay | Admitting: Neurology

## 2023-08-26 ENCOUNTER — Ambulatory Visit (INDEPENDENT_AMBULATORY_CARE_PROVIDER_SITE_OTHER): Payer: Medicare Other | Admitting: Family Medicine

## 2023-08-26 ENCOUNTER — Encounter: Payer: Self-pay | Admitting: Family Medicine

## 2023-08-26 VITALS — BP 120/70 | HR 55 | Ht 70.0 in | Wt 169.8 lb

## 2023-08-26 DIAGNOSIS — E1142 Type 2 diabetes mellitus with diabetic polyneuropathy: Secondary | ICD-10-CM

## 2023-08-26 DIAGNOSIS — D509 Iron deficiency anemia, unspecified: Secondary | ICD-10-CM

## 2023-08-26 DIAGNOSIS — E512 Wernicke's encephalopathy: Secondary | ICD-10-CM

## 2023-08-26 DIAGNOSIS — E876 Hypokalemia: Secondary | ICD-10-CM

## 2023-08-26 DIAGNOSIS — Z7984 Long term (current) use of oral hypoglycemic drugs: Secondary | ICD-10-CM

## 2023-08-26 LAB — MAGNESIUM: Magnesium: 2.3 mg/dL (ref 1.5–2.5)

## 2023-08-26 LAB — BASIC METABOLIC PANEL
BUN: 17 mg/dL (ref 6–23)
CO2: 31 meq/L (ref 19–32)
Calcium: 10 mg/dL (ref 8.4–10.5)
Chloride: 104 meq/L (ref 96–112)
Creatinine, Ser: 1.28 mg/dL (ref 0.40–1.50)
GFR: 55.65 mL/min — ABNORMAL LOW (ref >60.00)
Glucose, Bld: 118 mg/dL — ABNORMAL HIGH (ref 70–99)
Potassium: 3.6 meq/L (ref 3.5–5.1)
Sodium: 141 meq/L (ref 135–145)

## 2023-08-26 LAB — CBC WITH DIFFERENTIAL/PLATELET
Basophils Absolute: 0 10*3/uL (ref 0.0–0.1)
Basophils Relative: 0.6 % (ref 0.0–3.0)
Eosinophils Absolute: 0.1 10*3/uL (ref 0.0–0.7)
Eosinophils Relative: 2.2 % (ref 0.0–5.0)
HCT: 39.8 % (ref 39.0–52.0)
Hemoglobin: 12.9 g/dL — ABNORMAL LOW (ref 13.0–17.0)
Lymphocytes Relative: 22.6 % (ref 12.0–46.0)
Lymphs Abs: 1.4 10*3/uL (ref 0.7–4.0)
MCHC: 32.5 g/dL (ref 30.0–36.0)
MCV: 97.2 fL (ref 78.0–100.0)
Monocytes Absolute: 0.8 10*3/uL (ref 0.1–1.0)
Monocytes Relative: 12.1 % — ABNORMAL HIGH (ref 3.0–12.0)
Neutro Abs: 4 10*3/uL (ref 1.4–7.7)
Neutrophils Relative %: 62.5 % (ref 43.0–77.0)
Platelets: 211 10*3/uL (ref 150.0–400.0)
RBC: 4.1 Mil/uL — ABNORMAL LOW (ref 4.22–5.81)
RDW: 16.7 % — ABNORMAL HIGH (ref 11.5–15.5)
WBC: 6.4 10*3/uL (ref 4.0–10.5)

## 2023-08-26 LAB — HEMOGLOBIN A1C: Hgb A1c MFr Bld: 5.9 % (ref 4.6–6.5)

## 2023-08-26 MED ORDER — GABAPENTIN 300 MG PO CAPS: ORAL_CAPSULE | ORAL | 3 refills | Status: DC

## 2023-08-26 NOTE — Progress Notes (Signed)
Established Patient Office Visit  Subjective   Patient ID: Adam [name], male    DOB: 1951/02/10  Age: 72 y.o. MRN: [id number]  Chief Complaint  Patient presents with   Medical Management of Chronic Issues    HPI   [name] is seen for medical follow-up.  Accompanied by wife.  Wife provides a lot of history and [name] writes out several items secondary to speech difficulties.  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  Was scheduled for speech therapy and physical therapy per neurologist and they discussed getting follow-up swallowing study.  Slightly also plan to get follow-up brain MRI but patient has a loop recorder in place.  He has been steadily losing weight with almost 100 pounds total weight loss from several years ago.  They attributed a lot of this to difficulty swallowing.  Does somehow managed to drink alcohol regularly but had difficulties with certain foods.  They try to focus on soft consistency things like yogurt and mashed potatoes.  He had recent labs with potassium 3.2.  History of iron deficiency anemia.  Diabetes has improved with his weight loss.  Last A1c 5.7%.  Was able to come off insulin completely.  Still takes Synjardy 25/1001 daily.  Continues to have frequent falls probably related to his neuropathy, progressive balance difficulties, and alcoholism.  Multiple bruises and eschars on his upper extremities.  He is on Eliquis chronically.  Past Medical History:  Diagnosis Date   Alcohol withdrawal (HCC) 10/24/2019   Allergy    Anxiety    history of PTSD following CABG   Ascending aortic aneurysm (HCC) 01/31/2018   43 x 42 mm, pt unaware   Asthma    Cardiomegaly 10/17/2017   Colitis- colonoscopy 2014 07/13/2015   COPD (chronic obstructive pulmonary disease) (HCC)    Coronary artery disease    x 6   Depression    Diabetes mellitus without  complication (HCC)    Family history of polyps in the colon    Finger dislocation    Left pinkie   GERD (gastroesophageal reflux disease)    Gout    H/O atrial fibrillation without current medication    following CABG with no documented episodes since then.   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   Past Surgical History:  Procedure Laterality Date   ANKLE FRACTURE SURGERY Right 1991   APPENDECTOMY     ATRIAL FIBRILLATION ABLATION N/A 03/31/2018   Procedure: ATRIAL FIBRILLATION ABLATION;  Surgeon: Hillis Range, MD;  Location: [facility];  Service: Cardiovascular;  Laterality: N/A;   CARDIAC ELECTROPHYSIOLOGY MAPPING AND ABLATION     CARDIOVERSION N/A 03/23/2021   Procedure: CARDIOVERSION;  Surgeon: Elease Hashimoto Deloris Ping, MD;  Location: [facility] ENDOSCOPY;  Service: Cardiovascular;  Laterality: N/A;   COLONOSCOPY  06/11/2021   COLONOSCOPY W/ BIOPSIES  2017   x7   CORONARY ANGIOPLASTY WITH STENT PLACEMENT     CORONARY ARTERY BYPASS GRAFT     FINGER SURGERY  04/2018   Small finger left hand   implantable loop recorder placement  07/02/2019   Medtronic Reveal [place] model [id number] (SN [id number]) implanted in office by Dr Johney Frame   TEE WITHOUT CARDIOVERSION N/A 03/30/2018   Procedure: TRANSESOPHAGEAL ECHOCARDIOGRAM (TEE);  Surgeon: Thurmon Fair, MD;  Location: [facility] ENDOSCOPY;  Service: Cardiovascular;  Laterality: N/A;   TEE WITHOUT CARDIOVERSION N/A 03/23/2021   Procedure: TRANSESOPHAGEAL ECHOCARDIOGRAM (TEE);  Surgeon: Elease Hashimoto Deloris Ping, MD;  Location: [facility] ENDOSCOPY;  Service: Cardiovascular;  Laterality: N/A;   TOTAL HIP ARTHROPLASTY Left    TOTAL HIP ARTHROPLASTY Right 09/12/2018   Procedure:  TOTAL HIP ARTHROPLASTY ANTERIOR APPROACH;  Surgeon: Durene Romans, MD;  Location: [facility] ORS;  Service: Orthopedics;  Laterality: Right;  70 mins    reports that he quit smoking about 45 years ago. His smoking use included cigarettes. He started smoking about 52 years ago. He has a 7 pack-year smoking history. He has never used smokeless tobacco. He reports current alcohol use of about 3.0 - 4.0 standard drinks of alcohol per week. He reports that he does not use drugs. family history includes Cancer in his father; Colon cancer in his mother; Heart disease in his paternal grandfather and paternal grandmother. Allergies  Allergen Reactions   Amoxicillin Rash and Other (See Comments)    RASH IN GROIN AREA Has patient had a PCN reaction causing immediate rash, facial/tongue/throat swelling, SOB or lightheadedness with hypotension: no Has patient had a PCN reaction causing severe rash involving mucus membranes or skin necrosis: no Has patient had a PCN reaction that required hospitalization: no Has patient had a PCN reaction occurring within the last 10 years: yes If all of the above answers are "NO", then may proceed with Cephalosporin use.    Augmentin [Amoxicillin-Pot Clavulanate] Rash and Other (See Comments)    RASH IN GROIN AREA   Azithromycin Rash and Other (See Comments)    RASH IN GROIN AREA   Clindamycin/Lincomycin Rash   Keflex [Cephalexin] Rash   Xarelto [Rivaroxaban] Other (See Comments)    Patient stated he "ended up in the hospital with a rectal bleed"    Review of Systems  Constitutional:  Negative for chills and fever.  Respiratory:  Negative for shortness of breath.   Cardiovascular:  Negative for chest pain.  Gastrointestinal:  Negative for abdominal pain.  Genitourinary:  Negative for dysuria.      Objective:     BP 120/70 (BP Location: Left Arm, Patient Position: Sitting, Cuff Size: Normal)   Pulse (!) 55   Ht 5\' 10"  (1.778 m)   Wt 169 lb 12.8 oz (77 kg)   SpO2  99%   BMI 24.36 kg/m  BP Readings from Last 3 Encounters:  08/26/23 120/70  08/17/23 129/78  07/28/23 98/70   Wt Readings from Last 3 Encounters:  08/26/23 169 lb 12.8 oz (77 kg)  08/17/23 170 lb 8 oz (77.3 kg)  07/28/23 172 lb 6.4 oz (78.2 kg)      Physical Exam Vitals reviewed.  Constitutional:      Comments: Alert and cooperative.  Basically aphasic from his pseudobulbar palsy  Cardiovascular:     Rate and Rhythm: Normal rate.  Pulmonary:     Effort: Pulmonary effort is normal.     Breath sounds: Normal breath sounds. No wheezing or rales.  Musculoskeletal:     Right lower leg: No edema.     Left lower leg: No edema.  Neurological:     Mental Status: He is alert.      No results found for any visits on 08/26/23.  Last CBC Lab Results  Component Value Date   WBC 7.3 03/29/2023   HGB 11.3 (L) 03/29/2023   HCT 35.8 (L) 03/29/2023   MCV 91.3 03/29/2023   MCH 34.3 (H) 11/05/2022   RDW 26.0 (H) 03/29/2023   PLT 234.0 03/29/2023   Last metabolic panel Lab Results  Component Value Date   GLUCOSE 115 (H) 07/28/2023   NA 141 07/28/2023   K 3.2 (L) 07/28/2023   CL 108 07/28/2023   CO2 24 07/28/2023   BUN 14 07/28/2023   CREATININE 1.04 07/28/2023   GFRNONAA >60 07/28/2023   CALCIUM 9.6 07/28/2023   PHOS 3.2 03/20/2021   PROT 6.6 07/28/2023   ALBUMIN 3.5 07/28/2023   LABGLOB 2.7 06/11/2022   AGRATIO 1.6 06/11/2022   BILITOT 1.2 07/28/2023   ALKPHOS 48 07/28/2023   AST 28 07/28/2023   ALT 20 07/28/2023   ANIONGAP 9 07/28/2023   Last hemoglobin A1c Lab Results  Component Value Date   HGBA1C 5.7 (A) 05/27/2023   Last thyroid functions Lab Results  Component Value Date   TSH 2.122 07/28/2023   Last vitamin B12 and Folate Lab Results  Component Value Date   VITAMINB12 353 02/09/2020      The ASCVD Risk score (Arnett DK, et al., 2019) failed to calculate for the following reasons:   The valid total cholesterol range is 130 to 320 mg/dL     Assessment & Plan:   #1  Wernicke encephalopathy/pseudobulbar palsy-stable and followed by neurology.  Recent orders for speech therapy and physical therapy.  Follow-up neuroimaging has been ordered.  Discussed his very high risk of aspiration pneumonia and suggestions for swallowing.  He will get further information from speech therapy.  #2 type 2 diabetes well-controlled with recent weight loss issues.  Recheck A1c.  May be able to get rid of his metformin if A1c continues to drop.  Set up 27-month follow-up  #3 hypokalemia.  Recent potassium 3.2.  Recheck basic metabolic panel and magnesium today.  Discussed dietary sources of potassium.  He would likely have difficulty with larger tablets and we mentioned that liquid potassium is very non-palatable  #4 history of iron deficiency anemia.  Patient on Eliquis.  No recent obvious blood loss.  Recheck CBC along with iron studies.   Return in about 3 months (around 11/26/2023).    Evelena Peat, MD

## 2023-08-27 ENCOUNTER — Other Ambulatory Visit: Payer: Self-pay | Admitting: Family Medicine

## 2023-08-27 LAB — IRON,TIBC AND FERRITIN PANEL
%SAT: 19 % — ABNORMAL LOW (ref 20–48)
Ferritin: 24 ng/mL (ref 24–380)
Iron: 70 ug/dL (ref 50–180)
TIBC: 365 ug/dL (ref 250–425)

## 2023-08-29 ENCOUNTER — Other Ambulatory Visit: Payer: Self-pay

## 2023-08-29 ENCOUNTER — Ambulatory Visit: Payer: Medicare Other | Attending: Neurology

## 2023-08-29 ENCOUNTER — Ambulatory Visit: Payer: Medicare Other

## 2023-08-29 DIAGNOSIS — R1310 Dysphagia, unspecified: Secondary | ICD-10-CM | POA: Insufficient documentation

## 2023-08-29 DIAGNOSIS — R2689 Other abnormalities of gait and mobility: Secondary | ICD-10-CM | POA: Diagnosis not present

## 2023-08-29 DIAGNOSIS — R1312 Dysphagia, oropharyngeal phase: Secondary | ICD-10-CM | POA: Diagnosis not present

## 2023-08-29 DIAGNOSIS — R471 Dysarthria and anarthria: Secondary | ICD-10-CM | POA: Diagnosis not present

## 2023-08-29 DIAGNOSIS — M6281 Muscle weakness (generalized): Secondary | ICD-10-CM | POA: Diagnosis not present

## 2023-08-29 DIAGNOSIS — R296 Repeated falls: Secondary | ICD-10-CM | POA: Diagnosis not present

## 2023-08-29 DIAGNOSIS — R498 Other voice and resonance disorders: Secondary | ICD-10-CM | POA: Insufficient documentation

## 2023-08-29 DIAGNOSIS — R2681 Unsteadiness on feet: Secondary | ICD-10-CM | POA: Diagnosis not present

## 2023-08-29 DIAGNOSIS — R4701 Aphasia: Secondary | ICD-10-CM

## 2023-08-29 NOTE — Therapy (Deleted)
OUTPATIENT SPEECH LANGUAGE PATHOLOGY SWALLOW EVALUATION   Patient Name: Adam [name] MRN: [id number] DOB:[birth year], 72 y.o., male Today's Date: 08/29/2023  PCP: *** REFERRING PROVIDER: ***  END OF SESSION:   Past Medical History:  Diagnosis Date   Alcohol withdrawal (HCC) 10/24/2019   Allergy    Anxiety    history of PTSD following CABG   Ascending aortic aneurysm (HCC) 01/31/2018   43 x 42 mm, pt unaware   Asthma    Cardiomegaly 10/17/2017   Colitis- colonoscopy 2014 07/13/2015   COPD (chronic obstructive pulmonary disease) (HCC)    Coronary artery disease    x 6   Depression    Diabetes mellitus without complication (HCC)    Family history of polyps in the colon    Finger dislocation    Left pinkie   GERD (gastroesophageal reflux disease)    Gout    H/O atrial fibrillation without current medication    following CABG with no documented episodes since then.   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   Past Surgical History:  Procedure Laterality Date   ANKLE FRACTURE SURGERY Right 1991   APPENDECTOMY     ATRIAL FIBRILLATION ABLATION N/A 03/31/2018   Procedure: ATRIAL FIBRILLATION ABLATION;  Surgeon: Hillis Range, MD;  Location: [facility];  Service: Cardiovascular;  Laterality: N/A;   CARDIAC ELECTROPHYSIOLOGY MAPPING AND ABLATION     CARDIOVERSION N/A 03/23/2021   Procedure: CARDIOVERSION;  Surgeon: Elease Hashimoto Deloris Ping, MD;  Location: [facility] ENDOSCOPY;  Service: Cardiovascular;  Laterality: N/A;   COLONOSCOPY  06/11/2021   COLONOSCOPY W/ BIOPSIES  2017   x7   CORONARY ANGIOPLASTY WITH STENT PLACEMENT     CORONARY ARTERY BYPASS GRAFT     FINGER SURGERY  04/2018    Small finger left hand   implantable loop recorder placement  07/02/2019   Medtronic Reveal [place] model [id number] (SN [id number]) implanted in office by Dr Johney Frame   TEE WITHOUT CARDIOVERSION N/A 03/30/2018   Procedure: TRANSESOPHAGEAL ECHOCARDIOGRAM (TEE);  Surgeon: Thurmon Fair, MD;  Location: [facility] ENDOSCOPY;  Service: Cardiovascular;  Laterality: N/A;   TEE WITHOUT CARDIOVERSION N/A 03/23/2021   Procedure: TRANSESOPHAGEAL ECHOCARDIOGRAM (TEE);  Surgeon: Elease Hashimoto Deloris Ping, MD;  Location: [facility] ENDOSCOPY;  Service: Cardiovascular;  Laterality: N/A;   TOTAL HIP ARTHROPLASTY Left    TOTAL HIP ARTHROPLASTY Right 09/12/2018   Procedure: TOTAL HIP ARTHROPLASTY ANTERIOR APPROACH;  Surgeon: Durene Romans, MD;  Location: [facility];  Service: Orthopedics;  Laterality: Right;  70 mins   Patient Active Problem List   Diagnosis Date Noted   COPD with asthma (HCC) 03/02/2022   Wernicke encephalopathy 10/14/2021   Psoriasis 10/14/2021   Hypokalemia 07/30/2021   Hypertension 06/05/2020   Pressure injury of skin 02/21/2020   Rapid atrial fibrillation (HCC) 02/20/2020   High anion gap metabolic acidosis 02/08/2020   Hypercoagulable state due to persistent atrial fibrillation (HCC) 11/02/2019   Paroxysmal atrial fibrillation (HCC) 10/24/2019   Degeneration of lumbar intervertebral disc 09/27/2019   Thoracic aortic aneurysm (HCC) 02/08/2019   Hemoptysis 10/06/2018   S/P right THA, 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   S/P hip replacement 09/12/2018   Low back  pain 05/19/2018   Sleep apnea 01/09/2018   Persistent atrial fibrillation 12/12/2017   Allergic rhinitis 08/22/2015   Family history of colon cancer in mother deceased age 109 05-Aug-2015   Type 2 diabetes mellitus, controlled (HCC) 07/17/2013   COPD with acute exacerbation (HCC) 07/05/2013   CAD (coronary artery disease) 06/26/2013   Hypertension associated with diabetes (HCC) 06/26/2013   Hyperlipidemia associated with type 2 diabetes mellitus (HCC) 06/26/2013   GERD  (gastroesophageal reflux disease) 06/26/2013   History of atrial fibrillation without current medication 06/26/2013   Gout 06/26/2013   Osteoarthritis 06/26/2013   Asthma, mild persistent 06/26/2013   Alcohol abuse 06/26/2013   PTSD (post-traumatic stress disorder) 06/26/2013   HLA B27 (HLA B27 positive) 06/26/2013   Obesity (BMI 30-39.9) 06/26/2013   Benign essential hypertension 06/26/2013    ONSET DATE: ***   REFERRING DIAG: ***  THERAPY DIAG:  No diagnosis found.  Rationale for Evaluation and Treatment: {HABREHAB:27488}  SUBJECTIVE:   SUBJECTIVE STATEMENT: *** Pt accompanied by: {accompnied:27141}  PERTINENT HISTORY: ***  PAIN:  Are you having pain? {OPRCPAIN:27236}  FALLS: Has patient fallen in last 6 months?  {ZOXWRUEA:54098}  LIVING ENVIRONMENT: Lives with: {OPRC lives with:25569::"lives with their family"} Lives in: {Lives in:25570}  PLOF:  Level of assistance: {JXBJYNW:29562} Employment: {SLPemployment:25674}  PATIENT GOALS: ***  OBJECTIVE:  Note: Objective measures were completed at Evaluation unless otherwise noted. OBJECTIVE:   DIAGNOSTIC FINDINGS: ***  INSTRUMENTAL SWALLOW STUDY FINDINGS ({mbss fees:29767}) *** Objective swallow impairments: *** Objective recommended compensations: ***  COGNITION: Overall cognitive status: {cognition:24006} Areas of impairment:  {cognitiveimpairmentslp:27409} Functional deficits: ***  SUBJECTIVE DYSPHAGIA REPORTS:  Date of onset: *** Reported symptoms: {dysphagia symptoms:29766}  Current diet: {slpdiet:27196}  Co-morbid voice changes: {yes/no:20286}  FACTORS WHICH MAY INCREASE RISK OF ADVERSE EVENT IN PRESENCE OF ASPIRATION:  General health: {CSE general health:29764}  Risk factors: {CSE risk factors:29763}    ORAL MOTOR EXAMINATION: Overall status: {OMESLP2:27645} Comments: ***  CLINICAL SWALLOW ASSESSMENT:   Dentition: {CSE dentition:29769} Vocal quality at baseline: {VQL:27192} Patient  directly observed with POs: {POobserved:27199} Feeding: {slp feeding:27200} Liquids provided by: {SLPliquids:27201} [facility] Swallow Protocol: {Pass/Fail (Optional):210140017} Oral phase signs and symptoms: {SLPoralphase:27202} Pharyngeal phase signs and symptoms: {SLPpharyngealphase:27203}  PATIENT REPORTED OUTCOME MEASURES (PROM): {SLPPROM:27095}   TODAY'S TREATMENT:                                                                                                                                         DATE: ***  PATIENT EDUCATION: Education details: *** Person educated: {Person educated:25204} Education method: {Education Method:25205} Education comprehension: {Education Comprehension:25206}   ASSESSMENT:  CLINICAL IMPRESSION: Patient is a *** y.o. *** who was seen today for ***.   OBJECTIVE IMPAIRMENTS: include {SLPOBJIMP:27107}. These impairments are limiting patient from {SLPLIMIT:27108}. Factors affecting potential to achieve goals and functional outcome are {SLP potential:25450}. Patient will benefit from skilled SLP services to address above impairments and improve  overall function.  REHAB POTENTIAL: {rehabpotential:25112}   GOALS: Goals reviewed with patient? {yes/no:20286}  SHORT TERM GOALS: Target date: ***  *** Baseline: Goal status: INITIAL  2.  *** Baseline:  Goal status: INITIAL  3.  *** Baseline:  Goal status: INITIAL  4.  *** Baseline:  Goal status: INITIAL  5.  *** Baseline:  Goal status: INITIAL  6.  *** Baseline:  Goal status: INITIAL  LONG TERM GOALS: Target date: ***  *** Baseline:  Goal status: INITIAL  2.  *** Baseline:  Goal status: INITIAL  3.  *** Baseline:  Goal status: INITIAL  4.  *** Baseline:  Goal status: INITIAL  5.  *** Baseline:  Goal status: INITIAL  6.  *** Baseline:  Goal status: INITIAL  PLAN:  SLP FREQUENCY: {rehab frequency:25116}  SLP DURATION: {rehab duration:25117}  PLANNED  INTERVENTIONS: {SLP treatment/interventions:25449}    Harjot Dibello, CCC-SLP 08/29/2023, 3:24 PM

## 2023-08-29 NOTE — Therapy (Signed)
[place] [facility] [address], [address] [place], [place], [address] Phone: [phone]   Fax:  [phone]  Patient Details  Name: Adam [name] MRN: [id number] Date of Birth: Jan 28, 1951 Referring Provider:  Windell Norfolk, MD  Encounter Date: 08/29/2023   Arrived - No Charge  SLP took pt and wife back to ST room and spent approx 25 minutes ascertaining details behind pt's symptoms. Pt, wife, and SLP agreed pt's evaluation would be best to be rescheduled and so evaluation was rescheduled to Wednesday 08/31/23.  Pt has open mouth posture, was not managing secretions, and exhibited "wet" voice, and has lost approx 60 lbs in 15 months. He is currently eating mostly a pureed diet with thin-nectar liquids. Wife and pt agree pt has not had PNA in the last 12 months. MBS was thus recommended to referring MD.  Verdie Mosher, CCC-SLP 08/29/2023, 4:15 PM  [place] [facility] [address], [address] [place], [place], [address] Phone: [phone]   Fax:  [phone]

## 2023-08-29 NOTE — Therapy (Signed)
OUTPATIENT PHYSICAL THERAPY NEURO EVALUATION   Patient Name: Adam [name] MRN: [id number] DOB:29-Aug-1951, 72 y.o., male Today's Date: 08/29/2023   PCP: Evelena Peat, MD REFERRING PROVIDER: Windell Norfolk, MD  END OF SESSION:  PT End of Session - 08/29/23 1518     Visit Number 1    Number of Visits 12    Date for PT Re-Evaluation 10/10/23    Authorization Type [employer] Medicare    Progress Note Due on Visit 10    PT Start Time 1530    PT Stop Time 1615    PT Time Calculation (min) 45 min             Past Medical History:  Diagnosis Date   Alcohol withdrawal (HCC) 10/24/2019   Allergy    Anxiety    history of PTSD following CABG   Ascending aortic aneurysm (HCC) 01/31/2018   43 x 42 mm, pt unaware   Asthma    Cardiomegaly 10/17/2017   Colitis- colonoscopy 2014 07/13/2015   COPD (chronic obstructive pulmonary disease) (HCC)    Coronary artery disease    x 6   Depression    Diabetes mellitus without complication (HCC)    Family history of polyps in the colon    Finger dislocation    Left pinkie   GERD (gastroesophageal reflux disease)    Gout    H/O atrial fibrillation without current medication    following CABG with no documented episodes since then.   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   Past Surgical History:  Procedure Laterality Date   ANKLE FRACTURE SURGERY Right 1991   APPENDECTOMY     ATRIAL FIBRILLATION ABLATION N/A 03/31/2018   Procedure: ATRIAL FIBRILLATION ABLATION;  Surgeon: Hillis Range, MD;  Location: [facility];  Service: Cardiovascular;  Laterality: N/A;   CARDIAC ELECTROPHYSIOLOGY MAPPING AND ABLATION     CARDIOVERSION N/A  03/23/2021   Procedure: CARDIOVERSION;  Surgeon: Elease Hashimoto Deloris Ping, MD;  Location: [facility] ENDOSCOPY;  Service: Cardiovascular;  Laterality: N/A;   COLONOSCOPY  06/11/2021   COLONOSCOPY W/ BIOPSIES  2017   x7   CORONARY ANGIOPLASTY WITH STENT PLACEMENT     CORONARY ARTERY BYPASS GRAFT     FINGER SURGERY  04/2018   Small finger left hand   implantable loop recorder placement  07/02/2019   Medtronic Reveal [place] model LNQ11 (SN [id number]) implanted in office by Dr Johney Frame   TEE WITHOUT CARDIOVERSION N/A 03/30/2018   Procedure: TRANSESOPHAGEAL ECHOCARDIOGRAM (TEE);  Surgeon: Thurmon Fair, MD;  Location: [facility] ENDOSCOPY;  Service: Cardiovascular;  Laterality: N/A;   TEE WITHOUT CARDIOVERSION N/A 03/23/2021   Procedure: TRANSESOPHAGEAL ECHOCARDIOGRAM (TEE);  Surgeon: Elease Hashimoto Deloris Ping, MD;  Location: [facility] ENDOSCOPY;  Service: Cardiovascular;  Laterality: N/A;   TOTAL HIP ARTHROPLASTY Left    TOTAL HIP ARTHROPLASTY Right 09/12/2018   Procedure: TOTAL HIP ARTHROPLASTY ANTERIOR APPROACH;  Surgeon: Durene Romans, MD;  Location: [facility] ORS;  Service: Orthopedics;  Laterality: Right;  70 mins   Patient Active Problem List   Diagnosis Date Noted   COPD with asthma (HCC) 03/02/2022   Wernicke encephalopathy 10/14/2021   Psoriasis 10/14/2021  Hypokalemia 07/30/2021   Hypertension 06/05/2020   Pressure injury of skin 02/21/2020   Rapid atrial fibrillation (HCC) 02/20/2020   High anion gap metabolic acidosis 02/08/2020   Hypercoagulable state due to persistent atrial fibrillation (HCC) 11/02/2019   Paroxysmal atrial fibrillation (HCC) 10/24/2019   Degeneration of lumbar intervertebral disc 09/27/2019   Thoracic aortic aneurysm (HCC) 02/08/2019   Hemoptysis 10/06/2018   S/P right THA, 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   S/P hip replacement 09/12/2018   Low back pain 05/19/2018   Sleep apnea 01/09/2018   Persistent atrial fibrillation 12/12/2017   Allergic rhinitis 08/22/2015   Family history of colon cancer in mother deceased  age 47 07/28/15   Type 2 diabetes mellitus, controlled (HCC) 07/17/2013   COPD with acute exacerbation (HCC) 07/05/2013   CAD (coronary artery disease) 06/26/2013   Hypertension associated with diabetes (HCC) 06/26/2013   Hyperlipidemia associated with type 2 diabetes mellitus (HCC) 06/26/2013   GERD (gastroesophageal reflux disease) 06/26/2013   History of atrial fibrillation without current medication 06/26/2013   Gout 06/26/2013   Osteoarthritis 06/26/2013   Asthma, mild persistent 06/26/2013   Alcohol abuse 06/26/2013   PTSD (post-traumatic stress disorder) 06/26/2013   HLA B27 (HLA B27 positive) 06/26/2013   Obesity (BMI 30-39.9) 06/26/2013   Benign essential hypertension 06/26/2013    ONSET DATE: 10/14/21  REFERRING DIAG: R29.6 (ICD-10-CM) - Recurrent falls  THERAPY DIAG:  Unsteadiness on feet  Other abnormalities of gait and mobility  Muscle weakness (generalized)  Rationale for Evaluation and Treatment: Rehabilitation  SUBJECTIVE:                                                                                                                                                                                             SUBJECTIVE STATEMENT: Having frequent falls. Recently requiring EMS to respond to assist as spouse unable to assist in recovery to stand. Notes frequent falls and striking head and reports light sensitivity, no phonophobia. Notes some instances of HA, but Denies any dizziness but feels lightheaded if stands too quickly. Chief difficulty is imbalance from ataxia and generalized weakness.  Reports limited activity at home, primarily sedentary.  Frequently falls when attempting to bend forward to pick things up from the ground. Pt accompanied by: significant other [name]  PERTINENT HISTORY: Chronic alcohol, COPD, Heart disease, DM, Gout, Depression, HLD, bilat THR, hx of CABG. He is able to understand and communicate with writing and gesture, sometimes gets  frustrated when wife does not understand him.  He is still having gait abnormality.  Even with the walker, he continued to fall.  He continues to drink alcohol daily, even though  he decreased the amount of alcohol.  Wife tells me that he has lost a lot of weight almost 100 pounds due to the fact that he is choking, he cannot swallow, he drools all the time.  They tell me that they have not seen GI or have any new swallow study or endoscopy done. He is still have sign of his Wernicke including pseudobulbar affect   PAIN:  Are you having pain? No, right shoulder is sore from recent fall  PRECAUTIONS: Fall  RED FLAGS: None   WEIGHT BEARING RESTRICTIONS: No  FALLS: Has patient fallen in last 6 months?  "Too many to count"  LIVING ENVIRONMENT: Lives with: lives with their spouse Lives in: House/apartment Stairs: No Has following equipment at home: Environmental consultant - 4 wheeled and Grab bars  PLOF: Independent with basic ADLs and Independent with household mobility with device  PATIENT GOALS: "walk without falling. Be able to walk the dog "Jessie" (medium sized dog). Be able to get up from ground after a fall.  OBJECTIVE:  Note: Objective measures were completed at Evaluation unless otherwise noted.  DIAGNOSTIC FINDINGS: N/A for this episode  COGNITION: Overall cognitive status: History of cognitive impairments - at baseline   SENSATION: Not tested, reports neuropathy in BLE  COORDINATION: Difficulty with rapid alternating movements Heel to shin impaired LLE>RLE Unable to close eyes to perform finger to nose  EDEMA:  None present  MUSCLE TONE: NT  MUSCLE LENGTH: WFL  DTRs:  NT  POSTURE: No Significant postural limitations  LOWER EXTREMITY ROM:     Active  Right Eval Left Eval  Hip flexion 110+ 110+  Hip extension    Hip abduction    Hip adduction    Hip internal rotation    Hip external rotation    Knee flexion 120 120  Knee extension 0 0  Ankle dorsiflexion 15 15   Ankle plantarflexion    Ankle inversion    Ankle eversion     (Blank rows = not tested)  LOWER EXTREMITY MMT:    MMT Right Eval Left Eval  Hip flexion 3+ 3+  Hip extension    Hip abduction 3+ 3+  Hip adduction 3+ 3+  Hip internal rotation    Hip external rotation    Knee flexion 5 5  Knee extension 4 4  Ankle dorsiflexion 4 4  Ankle plantarflexion    Ankle inversion    Ankle eversion    (Blank rows = not tested)  BED MOBILITY:  Independent  TRANSFERS: Assistive device utilized: Environmental consultant - 4 wheeled  Sit to stand: Modified independence and SBA Stand to sit: Modified independence Chair to chair: Modified independence and SBA Floor:  NT but spouse reports usually requires male friend to assist when performing fall recovery from floor  RAMP:  Level of Assistance: SBA and CGA Assistive device utilized: Environmental consultant - 4 wheeled Ramp Comments:   CURB:  Level of Assistance: CGA Assistive device utilized: Environmental consultant - 4 wheeled Curb Comments:   STAIRS: NT, reports no stairs at home  GAIT: Gait pattern:  some instances of shuffling, RLE deficits from hx of orthopedic injuries/surgeries Distance walked:  Assistive device utilized: Environmental consultant - 4 wheeled Level of assistance: Modified independence and SBA Comments:   FUNCTIONAL TESTS:  5 times sit to stand: 1 rep Timed up and go (TUG): 16 sec w/ 1OX Berg Balance Scale: 38/56 Romberg: mild-moderate sway eyes open, unable to volitionally close eyes. Therapist obstructs vision with paper which caused worse imbalance  TODAY'S  TREATMENT:                                                                                                                              DATE: 08/29/23    PATIENT EDUCATION: Education details: assessment details, rationale of PT intervention, HEP initiation Person educated: Patient and Spouse Education method: Explanation and Handouts Education comprehension: verbalized understanding  HOME EXERCISE  PROGRAM: Access Code: [facility] URL: [email or web address] Date: 08/29/2023 Prepared by: Shary Decamp  Exercises - Standing Single Leg Stance with Counter Support  - 1 x daily - 7 x weekly - 3 sets - 10 sec hold  GOALS: Goals reviewed with patient? Yes  SHORT TERM GOALS: Target date: 09/19/2023    Patient will be independent in HEP to improve functional outcomes Baseline: Goal status: INITIAL  2.  Demo improved balance and BLE strength as evidenced by ability to perform 5xSTS test in 25 sec Baseline: able to complete 1 repetition Goal status: INITIAL  3.  Demo improved postural control exhibiting mild sway with Romberg position to improve safety with ADL Baseline: moderate Goal status: INITIAL    LONG TERM GOALS: Target date: 10/10/2023    Demo reduced risk for falls per score 49/56 Berg Balance Test Baseline: 38/56 Goal status: INITIAL  2.  Exhibit reduced risk for falls per time 12 sec TUG test Baseline: 16 sec w/ 7FI Goal status: INITIAL  3.  Demo improved BLE strength and reduced risk for falls per time of 15 sec 5xSTS test Baseline: 1 rep Goal status: INITIAL  4.  Demo modified independent floor to stand to improve capabilities for fall recovery Baseline: spouse reports dependence on physical assistance Goal status: INITIAL  5.  Demo ability to safely walk dog x 10 min to increase activity participation Baseline:  Goal status: INITIAL    ASSESSMENT:  CLINICAL IMPRESSION: Patient is a 72 y.o. male who was seen today for physical therapy evaluation and treatment for frequent falls.  Exhibits generalized weakness and high risk for falls per outcome measures and demonstrates significant balance and safety awareness deficits.  Pt reports primarily a sedentary lifestyle due to deficits and limitations and limited due to frequent/recurrent falls.  Pt would benefit from PT intervention to improve safety and independence with functional mobility to  improve quality of life and reduce caregiver burden.    OBJECTIVE IMPAIRMENTS: Abnormal gait, decreased activity tolerance, decreased balance, decreased coordination, decreased endurance, decreased mobility, difficulty walking, decreased strength, and decreased safety awareness.   ACTIVITY LIMITATIONS: carrying, lifting, bending, standing, squatting, transfers, reach over head, and locomotion level  PARTICIPATION LIMITATIONS: meal prep, cleaning, laundry, interpersonal relationship, shopping, community activity, and dog walking  PERSONAL FACTORS: Age, Time since onset of injury/illness/exacerbation, and 3+ comorbidities: PMH  are also affecting patient's functional outcome.   REHAB POTENTIAL: Good  CLINICAL DECISION MAKING: Evolving/moderate complexity  EVALUATION COMPLEXITY: Moderate  PLAN:  PT FREQUENCY: 2x/week  PT DURATION: 6 weeks  PLANNED INTERVENTIONS: [id number]-Therapeutic exercises,  [id number]- Therapeutic activity, [id number]- Neuromuscular re-education, [phone]- Self Care, [id number]- Manual therapy, [phone]- Gait training, [phone]- Canalith repositioning, [phone]- Aquatic Therapy, Patient/Family education, Balance training, Stair training, Taping, Dry Needling, Joint mobilization, Spinal mobilization, Vestibular training, DME instructions, and Wheelchair mobility training  PLAN FOR NEXT SESSION: HEP review (SLS at counter), lunge to stand with furniture for LE strength/fall recovery, general strength/balance   4:37 PM, 08/29/23 M. Shary Decamp, PT, DPT Physical Therapist- [place] Office Number: [phone]

## 2023-08-31 ENCOUNTER — Other Ambulatory Visit: Payer: Self-pay

## 2023-08-31 ENCOUNTER — Ambulatory Visit: Payer: Medicare Other

## 2023-08-31 ENCOUNTER — Other Ambulatory Visit: Payer: Self-pay | Admitting: Neurology

## 2023-08-31 ENCOUNTER — Other Ambulatory Visit (HOSPITAL_COMMUNITY): Payer: Self-pay | Admitting: Neurology

## 2023-08-31 ENCOUNTER — Ambulatory Visit: Payer: Self-pay

## 2023-08-31 DIAGNOSIS — M6281 Muscle weakness (generalized): Secondary | ICD-10-CM | POA: Diagnosis not present

## 2023-08-31 DIAGNOSIS — R1310 Dysphagia, unspecified: Secondary | ICD-10-CM

## 2023-08-31 DIAGNOSIS — R471 Dysarthria and anarthria: Secondary | ICD-10-CM

## 2023-08-31 DIAGNOSIS — R1312 Dysphagia, oropharyngeal phase: Secondary | ICD-10-CM

## 2023-08-31 DIAGNOSIS — R2681 Unsteadiness on feet: Secondary | ICD-10-CM | POA: Diagnosis not present

## 2023-08-31 DIAGNOSIS — R059 Cough, unspecified: Secondary | ICD-10-CM

## 2023-08-31 DIAGNOSIS — R4701 Aphasia: Secondary | ICD-10-CM

## 2023-08-31 DIAGNOSIS — R2689 Other abnormalities of gait and mobility: Secondary | ICD-10-CM | POA: Diagnosis not present

## 2023-08-31 DIAGNOSIS — R296 Repeated falls: Secondary | ICD-10-CM | POA: Diagnosis not present

## 2023-08-31 DIAGNOSIS — R498 Other voice and resonance disorders: Secondary | ICD-10-CM | POA: Diagnosis not present

## 2023-08-31 NOTE — Therapy (Signed)
OUTPATIENT PHYSICAL THERAPY NEURO TREATMENT   Patient Name: DECATUR SEGREST MRN: [id number] DOB:1951-04-13, 72 y.o., male Today's Date: 08/31/2023   PCP: Evelena Peat, MD REFERRING PROVIDER: Windell Norfolk, MD  END OF SESSION:  PT End of Session - 08/31/23 1307     Visit Number 2    Number of Visits 12    Date for PT Re-Evaluation 10/10/23    Authorization Type [employer] Medicare    Progress Note Due on Visit 10    PT Start Time 1315    PT Stop Time 1400    PT Time Calculation (min) 45 min             Past Medical History:  Diagnosis Date   Alcohol withdrawal (HCC) 10/24/2019   Allergy    Anxiety    history of PTSD following CABG   Ascending aortic aneurysm (HCC) 01/31/2018   43 x 42 mm, pt unaware   Asthma    Cardiomegaly 10/17/2017   Colitis- colonoscopy 2014 07/13/2015   COPD (chronic obstructive pulmonary disease) (HCC)    Coronary artery disease    x 6   Depression    Diabetes mellitus without complication (HCC)    Family history of polyps in the colon    Finger dislocation    Left pinkie   GERD (gastroesophageal reflux disease)    Gout    H/O atrial fibrillation without current medication    following CABG with no documented episodes since then.   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   Past Surgical History:  Procedure Laterality Date   ANKLE FRACTURE SURGERY Right 1991   APPENDECTOMY     ATRIAL FIBRILLATION ABLATION N/A 03/31/2018   Procedure: ATRIAL FIBRILLATION ABLATION;  Surgeon: Hillis Range, MD;  Location: [facility];  Service: Cardiovascular;  Laterality: N/A;   CARDIAC ELECTROPHYSIOLOGY MAPPING AND ABLATION     CARDIOVERSION N/A  03/23/2021   Procedure: CARDIOVERSION;  Surgeon: Elease Hashimoto Deloris Ping, MD;  Location: [facility] ENDOSCOPY;  Service: Cardiovascular;  Laterality: N/A;   COLONOSCOPY  06/11/2021   COLONOSCOPY W/ BIOPSIES  2017   x7   CORONARY ANGIOPLASTY WITH STENT PLACEMENT     CORONARY ARTERY BYPASS GRAFT     FINGER SURGERY  04/2018   Small finger left hand   implantable loop recorder placement  07/02/2019   Medtronic Reveal Irving model LNQ11 (SN [id number]) implanted in office by Dr Johney Frame   TEE WITHOUT CARDIOVERSION N/A 03/30/2018   Procedure: TRANSESOPHAGEAL ECHOCARDIOGRAM (TEE);  Surgeon: Thurmon Fair, MD;  Location: [facility] ENDOSCOPY;  Service: Cardiovascular;  Laterality: N/A;   TEE WITHOUT CARDIOVERSION N/A 03/23/2021   Procedure: TRANSESOPHAGEAL ECHOCARDIOGRAM (TEE);  Surgeon: Elease Hashimoto Deloris Ping, MD;  Location: [facility] ENDOSCOPY;  Service: Cardiovascular;  Laterality: N/A;   TOTAL HIP ARTHROPLASTY Left    TOTAL HIP ARTHROPLASTY Right 09/12/2018   Procedure: TOTAL HIP ARTHROPLASTY ANTERIOR APPROACH;  Surgeon: Durene Romans, MD;  Location: [facility] ORS;  Service: Orthopedics;  Laterality: Right;  70 mins   Patient Active Problem List   Diagnosis Date Noted   COPD with asthma (HCC) 03/02/2022   Wernicke encephalopathy 10/14/2021   Psoriasis 10/14/2021  Hypokalemia 07/30/2021   Hypertension 06/05/2020   Pressure injury of skin 02/21/2020   Rapid atrial fibrillation (HCC) 02/20/2020   High anion gap metabolic acidosis 02/08/2020   Hypercoagulable state due to persistent atrial fibrillation (HCC) 11/02/2019   Paroxysmal atrial fibrillation (HCC) 10/24/2019   Degeneration of lumbar intervertebral disc 09/27/2019   Thoracic aortic aneurysm (HCC) 02/08/2019   Hemoptysis 10/06/2018   S/P right THA, 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   S/P hip replacement 09/12/2018   Low back pain 05/19/2018   Sleep apnea 01/09/2018   Persistent atrial fibrillation 12/12/2017   Allergic rhinitis 08/22/2015   Family history of colon cancer in mother deceased  age 87 07/25/15   Type 2 diabetes mellitus, controlled (HCC) 07/17/2013   COPD with acute exacerbation (HCC) 07/05/2013   CAD (coronary artery disease) 06/26/2013   Hypertension associated with diabetes (HCC) 06/26/2013   Hyperlipidemia associated with type 2 diabetes mellitus (HCC) 06/26/2013   GERD (gastroesophageal reflux disease) 06/26/2013   History of atrial fibrillation without current medication 06/26/2013   Gout 06/26/2013   Osteoarthritis 06/26/2013   Asthma, mild persistent 06/26/2013   Alcohol abuse 06/26/2013   PTSD (post-traumatic stress disorder) 06/26/2013   HLA B27 (HLA B27 positive) 06/26/2013   Obesity (BMI 30-39.9) 06/26/2013   Benign essential hypertension 06/26/2013    ONSET DATE: 10/14/21  REFERRING DIAG: R29.6 (ICD-10-CM) - Recurrent falls  THERAPY DIAG:  Unsteadiness on feet  Other abnormalities of gait and mobility  Muscle weakness (generalized)  Rationale for Evaluation and Treatment: Rehabilitation  SUBJECTIVE:                                                                                                                                                                                             SUBJECTIVE STATEMENT: No new issues since last meeting Pt accompanied by: significant other [name]  PERTINENT HISTORY: Chronic alcohol, COPD, Heart disease, DM, Gout, Depression, HLD, bilat THR, hx of CABG. He is able to understand and communicate with writing and gesture, sometimes gets frustrated when wife does not understand him.  He is still having gait abnormality.  Even with the walker, he continued to fall.  He continues to drink alcohol daily, even though he decreased the amount of alcohol.  Wife tells me that he has lost a lot of weight almost 100 pounds due to the fact that he is choking, he cannot swallow, he drools all the time.  They tell me that they have not seen GI or have any new swallow study or endoscopy done. He is still have sign of  his Wernicke including pseudobulbar affect   PAIN:  Are you  having pain? No, right shoulder is sore from recent fall  PRECAUTIONS: Fall  RED FLAGS: None   WEIGHT BEARING RESTRICTIONS: No  FALLS: Has patient fallen in last 6 months?  "Too many to count"  LIVING ENVIRONMENT: Lives with: lives with their spouse Lives in: House/apartment Stairs: No Has following equipment at home: Environmental consultant - 4 wheeled and Grab bars  PLOF: Independent with basic ADLs and Independent with household mobility with device  PATIENT GOALS: "walk without falling. Be able to walk the dog "Jessie" (medium sized dog). Be able to get up from ground after a fall.  OBJECTIVE:   TODAY'S TREATMENT: 08/31/23 Activity Comments  Floor to stand recovery Max A, unable to achieve quadruped without assist  LAQ 3x10 5#  Standing march 2x10 5#  Sit to stand 2x5 reps Elevated EOM, single UE support  Deadlift eccentrics 2x5 15# KB performing eccentric lowering only  Standing on foam -static x 30 sec. Head/body turns 3x    PATIENT EDUCATION: Education details: assessment details, rationale of PT intervention, HEP initiation Person educated: Patient and Spouse Education method: Explanation and Handouts Education comprehension: verbalized understanding  HOME EXERCISE PROGRAM: Access Code: [facility] URL: [email or web address] Date: 08/29/2023 Prepared by: Shary Decamp  Exercises - Standing Single Leg Stance with Counter Support  - 1 x daily - 7 x weekly - 3 sets - 10 sec hold  Note: Objective measures were completed at Evaluation unless otherwise noted.  DIAGNOSTIC FINDINGS: N/A for this episode  COGNITION: Overall cognitive status: History of cognitive impairments - at baseline   SENSATION: Not tested, reports neuropathy in BLE  COORDINATION: Difficulty with rapid alternating movements Heel to shin impaired LLE>RLE Unable to close eyes to perform finger to nose  EDEMA:  None  present  MUSCLE TONE: NT  MUSCLE LENGTH: WFL  DTRs:  NT  POSTURE: No Significant postural limitations  LOWER EXTREMITY ROM:     Active  Right Eval Left Eval  Hip flexion 110+ 110+  Hip extension    Hip abduction    Hip adduction    Hip internal rotation    Hip external rotation    Knee flexion 120 120  Knee extension 0 0  Ankle dorsiflexion 15 15  Ankle plantarflexion    Ankle inversion    Ankle eversion     (Blank rows = not tested)  LOWER EXTREMITY MMT:    MMT Right Eval Left Eval  Hip flexion 3+ 3+  Hip extension    Hip abduction 3+ 3+  Hip adduction 3+ 3+  Hip internal rotation    Hip external rotation    Knee flexion 5 5  Knee extension 4 4  Ankle dorsiflexion 4 4  Ankle plantarflexion    Ankle inversion    Ankle eversion    (Blank rows = not tested)  BED MOBILITY:  Independent  TRANSFERS: Assistive device utilized: Environmental consultant - 4 wheeled  Sit to stand: Modified independence and SBA Stand to sit: Modified independence Chair to chair: Modified independence and SBA Floor:  NT but spouse reports usually requires male friend to assist when performing fall recovery from floor  RAMP:  Level of Assistance: SBA and CGA Assistive device utilized: Environmental consultant - 4 wheeled Ramp Comments:   CURB:  Level of Assistance: CGA Assistive device utilized: Environmental consultant - 4 wheeled Curb Comments:   STAIRS: NT, reports no stairs at home  GAIT: Gait pattern:  some instances of shuffling, RLE deficits from hx of orthopedic injuries/surgeries Distance walked:  Assistive device utilized:  Walker - 4 wheeled Level of assistance: Modified independence and SBA Comments:   FUNCTIONAL TESTS:  5 times sit to stand: 1 rep Timed up and go (TUG): 16 sec w/ 4UJ Berg Balance Scale: 38/56 Romberg: mild-moderate sway eyes open, unable to volitionally close eyes. Therapist obstructs vision with paper which caused worse imbalance    GOALS: Goals reviewed with patient? Yes  SHORT  TERM GOALS: Target date: 09/19/2023    Patient will be independent in HEP to improve functional outcomes Baseline: Goal status: INITIAL  2.  Demo improved balance and BLE strength as evidenced by ability to perform 5xSTS test in 25 sec Baseline: able to complete 1 repetition Goal status: INITIAL  3.  Demo improved postural control exhibiting mild sway with Romberg position to improve safety with ADL Baseline: moderate Goal status: INITIAL    LONG TERM GOALS: Target date: 10/10/2023    Demo reduced risk for falls per score 49/56 Berg Balance Test Baseline: 38/56 Goal status: INITIAL  2.  Exhibit reduced risk for falls per time 12 sec TUG test Baseline: 16 sec w/ 8JX Goal status: INITIAL  3.  Demo improved BLE strength and reduced risk for falls per time of 15 sec 5xSTS test Baseline: 1 rep Goal status: INITIAL  4.  Demo modified independent floor to stand to improve capabilities for fall recovery Baseline: spouse reports dependence on physical assistance Goal status: INITIAL  5.  Demo ability to safely walk dog x 10 min to increase activity participation Baseline:  Goal status: INITIAL    ASSESSMENT:  CLINICAL IMPRESSION: Floor to stand trial requiring max A due to inability to assume quadruped from generalized weakness, max A to achieve position and transition to sitting EOM.  LE strengthening to improve activity tolerance and strength for limb advancement in floor to stand.  Lifting mechanics to improve eccentric/concentric control for retrieving items from ground.  Multisensory balance to improve postural stability and awareness of limits of stability. Continued sessions to progress mobility and balance to reduce risk for falls  OBJECTIVE IMPAIRMENTS: Abnormal gait, decreased activity tolerance, decreased balance, decreased coordination, decreased endurance, decreased mobility, difficulty walking, decreased strength, and decreased safety awareness.   ACTIVITY  LIMITATIONS: carrying, lifting, bending, standing, squatting, transfers, reach over head, and locomotion level  PARTICIPATION LIMITATIONS: meal prep, cleaning, laundry, interpersonal relationship, shopping, community activity, and dog walking  PERSONAL FACTORS: Age, Time since onset of injury/illness/exacerbation, and 3+ comorbidities: PMH  are also affecting patient's functional outcome.   REHAB POTENTIAL: Good  CLINICAL DECISION MAKING: Evolving/moderate complexity  EVALUATION COMPLEXITY: Moderate  PLAN:  PT FREQUENCY: 2x/week  PT DURATION: 6 weeks  PLANNED INTERVENTIONS: 97110-Therapeutic exercises, 97530- Therapeutic activity, 97112- Neuromuscular re-education, 97535- Self Care, 91478- Manual therapy, [phone]- Gait training, [phone]- Canalith repositioning, [phone]- Aquatic Therapy, Patient/Family education, Balance training, Stair training, Taping, Dry Needling, Joint mobilization, Spinal mobilization, Vestibular training, DME instructions, and Wheelchair mobility training  PLAN FOR NEXT SESSION: HEP review (SLS at counter), lunge to stand with furniture for LE strength/fall recovery, general strength/balance   1:08 PM, 08/31/23 M. Shary Decamp, PT, DPT Physical Therapist- [place] Office Number: [phone]

## 2023-08-31 NOTE — Patient Instructions (Signed)
  Perform really good oral care FOUR times a day (teeth brushing, clean the pockets in your mouth between your teeth and your cheeks, roof of your mouth, etc. LEAN FORWARD as you perform this so that the toothpaste, mouthwash, etc does not go down your throat into your lungs  Biotene mouth rinse for dry mouth- apply with a Q-tip or a swab tilting forward to let it run out

## 2023-08-31 NOTE — Therapy (Signed)
OUTPATIENT SPEECH LANGUAGE PATHOLOGY SWALLOW EVALUATION   Patient Name: Adam [name] MRN: [id number] DOB:September 07, 1951, 72 y.o., male Today's Date: 08/31/2023  PCP: Evelena Peat, MD REFERRING PROVIDER: Shelby Mattocks, MD  END OF SESSION:  End of Session - 08/31/23 2244     Visit Number 1    Number of Visits 6    Date for SLP Re-Evaluation 10/07/23    SLP Start Time 1234    SLP Stop Time  1316    SLP Time Calculation (min) 42 min    Activity Tolerance Patient tolerated treatment well             Past Medical History:  Diagnosis Date   Alcohol withdrawal (HCC) 10/24/2019   Allergy    Anxiety    history of PTSD following CABG   Ascending aortic aneurysm (HCC) 01/31/2018   43 x 42 mm, pt unaware   Asthma    Cardiomegaly 10/17/2017   Colitis- colonoscopy 2014 07/13/2015   COPD (chronic obstructive pulmonary disease) (HCC)    Coronary artery disease    x 6   Depression    Diabetes mellitus without complication (HCC)    Family history of polyps in the colon    Finger dislocation    Left pinkie   GERD (gastroesophageal reflux disease)    Gout    H/O atrial fibrillation without current medication    following CABG with no documented episodes since then.   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   Past Surgical History:  Procedure Laterality Date   ANKLE FRACTURE SURGERY Right 1991   APPENDECTOMY     ATRIAL FIBRILLATION ABLATION N/A 03/31/2018   Procedure: ATRIAL FIBRILLATION ABLATION;  Surgeon: Hillis Range, MD;  Location: [facility];  Service: Cardiovascular;  Laterality: N/A;   CARDIAC ELECTROPHYSIOLOGY MAPPING AND ABLATION     CARDIOVERSION N/A 03/23/2021    Procedure: CARDIOVERSION;  Surgeon: Elease Hashimoto Deloris Ping, MD;  Location: [facility] ENDOSCOPY;  Service: Cardiovascular;  Laterality: N/A;   COLONOSCOPY  06/11/2021   COLONOSCOPY W/ BIOPSIES  2017   x7   CORONARY ANGIOPLASTY WITH STENT PLACEMENT     CORONARY ARTERY BYPASS GRAFT     FINGER SURGERY  04/2018   Small finger left hand   implantable loop recorder placement  07/02/2019   Medtronic Reveal [place] model LNQ11 (SN [id number]) implanted in office by Dr Johney Frame   TEE WITHOUT CARDIOVERSION N/A 03/30/2018   Procedure: TRANSESOPHAGEAL ECHOCARDIOGRAM (TEE);  Surgeon: Thurmon Fair, MD;  Location: [facility] ENDOSCOPY;  Service: Cardiovascular;  Laterality: N/A;   TEE WITHOUT CARDIOVERSION N/A 03/23/2021   Procedure: TRANSESOPHAGEAL ECHOCARDIOGRAM (TEE);  Surgeon: Elease Hashimoto Deloris Ping, MD;  Location: [facility] ENDOSCOPY;  Service: Cardiovascular;  Laterality: N/A;   TOTAL HIP ARTHROPLASTY Left    TOTAL HIP ARTHROPLASTY Right 09/12/2018   Procedure: TOTAL HIP ARTHROPLASTY ANTERIOR APPROACH;  Surgeon: Durene Romans, MD;  Location: [facility] ORS;  Service: Orthopedics;  Laterality: Right;  70 mins   Patient Active Problem List   Diagnosis Date Noted   COPD with asthma (HCC) 03/02/2022   Wernicke encephalopathy 10/14/2021   Psoriasis 10/14/2021   Hypokalemia 07/30/2021   Hypertension 06/05/2020  Pressure injury of skin 02/21/2020   Rapid atrial fibrillation (HCC) 02/20/2020   High anion gap metabolic acidosis 02/08/2020   Hypercoagulable state due to persistent atrial fibrillation (HCC) 11/02/2019   Paroxysmal atrial fibrillation (HCC) 10/24/2019   Degeneration of lumbar intervertebral disc 09/27/2019   Thoracic aortic aneurysm (HCC) 02/08/2019   Hemoptysis 10/06/2018   S/P right THA, 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   S/P hip replacement 09/12/2018   Low back pain 05/19/2018   Sleep apnea 01/09/2018   Persistent atrial fibrillation 12/12/2017   Allergic rhinitis 08/22/2015   Family history of colon cancer in mother deceased age 61  07-26-2015   Type 2 diabetes mellitus, controlled (HCC) 07/17/2013   COPD with acute exacerbation (HCC) 07/05/2013   CAD (coronary artery disease) 06/26/2013   Hypertension associated with diabetes (HCC) 06/26/2013   Hyperlipidemia associated with type 2 diabetes mellitus (HCC) 06/26/2013   GERD (gastroesophageal reflux disease) 06/26/2013   History of atrial fibrillation without current medication 06/26/2013   Gout 06/26/2013   Osteoarthritis 06/26/2013   Asthma, mild persistent 06/26/2013   Alcohol abuse 06/26/2013   PTSD (post-traumatic stress disorder) 06/26/2013   HLA B27 (HLA B27 positive) 06/26/2013   Obesity (BMI 30-39.9) 06/26/2013   Benign essential hypertension 06/26/2013    ONSET DATE: >12 months ago (script 08/18/23)  REFERRING DIAG: R47.01 (ICD-10-CM) - Aphasia R13.10 (ICD-10-CM) - Dysphagia, unspecified type  THERAPY DIAG:  Dysphagia, oropharyngeal phase  Dysarthria and anarthria  Neurologic voice disorder  Rationale for Evaluation and Treatment: Rehabilitation  SUBJECTIVE:   SUBJECTIVE STATEMENT: "He wears a bib all day:" "Dr. Teresa Coombs said he has fluid on his brain due to the drinking and it is causing him to kill brain cells." In writing, pt indicated that his frequency in swallowing solid food is impaired "every bite - getting worse". Pt has mostly protein drinks, and alcohol (wine and liquor), and water.  Pt accompanied by: significant other  PERTINENT HISTORY:  INTERVAL HISTORY 08/17/2023: Patient presents today for follow-up, he is accompanied by wife.  Last visit was over 2 years ago.  Since then, wife tells me that his condition is worse.  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  He is able to understand and communicate with writing and gesture, sometimes gets frustrated when wife does not understand him.  He is still having gait abnormality.  Even with the walker, he continued to fall.  He continues to drink alcohol daily, even though he decreased  the amount of alcohol.  Wife tells me that he has lost a lot of weight almost 100 pounds due to the fact that he is choking, he cannot swallow, he drools all the time.  They tell me that they have not seen GI or have any new swallow study or endoscopy done. He is still have sign of his Wernicke including pseudobulbar affect  Wife reports pt was still eating one year ago, but had already lost ability to speak effectively. Swallowing ability began decreasing shortly thereafter.  PAIN:  Are you having pain? No  FALLS: Has patient fallen in last 6 months?  See PT evaluation for details  LIVING ENVIRONMENT: Lives with: lives with their spouse Lives in: House/apartment  PLOF:  Level of assistance: Needed assistance with ADLs, Needed assistance with IADLS Employment: Retired  PATIENT GOALS: Inquire why pt has swallowing difficulty  OBJECTIVE:  Note: Objective measures were completed at Evaluation unless otherwise noted. OBJECTIVE:   DIAGNOSTIC FINDINGS:  CT March 16, 2021 COMPARISON: CT Feb 08, 2020. MRI  Feb 10, 2020.  IMPRESSION: 1. No evidence of acute large vascular territory infarct or acute hemorrhage. MRI could provide more sensitive evaluation for acute infarct if clinically indicated. 2. Similar ventriculomegaly. As mentioned on the prior MRI, this finding is likely in part related to global parenchymal atrophy, although normal pressure hydrocephalus could be contributory in the correct clinical setting. 3. Mild bilateral maxillary sinus mucosal thickening with air-fluid level on the left.  CT Feb 08, 2020 IMPRESSION: 1. No acute intracranial abnormality. 2. Advanced atrophy and chronic microvascular ischemic changes  INSTRUMENTAL SWALLOW STUDY FINDINGS (MBSS) : suggested to referring MD  EXPRESSIVE LANGUAGE: SLP had pt write with pen/paper for the session today and this was effective 95% of the time and 100% with questioning cues. However this means may not be most effective  due to extended time to write. SLP suggested typing on an old tablet and using a notes or word processing app to make communication more efficient between pt and communication partner.  COGNITION: Overall cognitive status: Impaired Areas of impairment:  Memory: Impaired: Short term Functional deficits: pt looking to wife for details of medical history  SUBJECTIVE DYSPHAGIA REPORTS:  Date of onset: approx one year ago Reported symptoms: coughing with both solids and liquids, difficulty chewing foods, weak voice, and we vocal quality  Current diet: Dysphagia 1 (puree), thin liquids, and nectar thick liquids  Co-morbid voice changes: Yes  FACTORS WHICH MAY INCREASE RISK OF ADVERSE EVENT IN PRESENCE OF ASPIRATION:  General health: poor general health  Risk factors: reduced cognitive function, weak cough, poor oral health, and dependent for oral care    ORAL MOTOR EXAMINATION: Overall status: Impaired: Labial: Bilateral (ROM and Coordination) Lingual: Bilateral (ROM and Coordination) Velum: ROM Mandible: ROM and Coordination  Comments: Pt had NO VOLITIONAL ORAL OR LARYNGEAL MOVEMENT today, only root reflexes observed (closed mandible and lips with pharyngeal stage of the swallow, and reflexive cough observed). Unable to cough, clear throat, vocalize appropriately (glottal fry was produced) upon command  CLINICAL SWALLOW ASSESSMENT:   Dentition: poor condition and missing dentition  upper and lower front teeth are decayed (pt wrote he cannot operate electric toothbrush) Vocal quality at baseline: rough and wet Patient directly observed with POs: Yes: dysphagia 1 (puree) and thin liquids  Feeding: able to feed self Liquids provided by: teaspoon and straw (straw with liquids to drop 1/4-1/8 teaspoon to posterior oral cavity) Oral phase signs and symptoms: oral holding and pt tilted head back to move 1/2 teaspoon applesauce posteriorly in oral cavity. With 1/8-1/4 teaspoon water, pt indicated  that at neutral head position, there was oral residue.  Pharyngeal phase signs and symptoms: multiple swallows, audible swallow, and wet vocal quality 1/2 teaspoon applesauce was removed as best as could be removed via paper towel and pt leaning forward. With water, pt had multiple swallows indicating possible pharyngeal residual. SLP very uncomfortable recommending any kind of modified POs at this time, prior to [facility], and told this to pt and wife. When SLP shared with pt that, based on results of clinical swallow assessment alternative means of nutrition (i.e., PEG) may be recommended after MBS, pt wrote, "Can't taste". When told material was likely entering his lungs with his PO intake and that alternative means of nutrition and hydration (i,e, PEG) would limit/keep POs from entering lungs and causing PNA, pt wrote "who cares".  PATIENT REPORTED OUTCOME MEASURES (PROM): Communication Effectiveness Survey: provided in first session   TODAY'S TREATMENT:  DATE:  08/31/23: SLP educated pt and wife on how best to limit bacteria and unwanted material from entering pt's lungs.   PATIENT EDUCATION: Education details: see above in "today's treatment" Person educated: Patient and Spouse Education method: Explanation, Demonstration, Tactile cues, Verbal cues, and Handouts Education comprehension: verbal cues required, tactile cues required, and indicated understanding   ASSESSMENT:  CLINICAL IMPRESSION: Patient is a 72 y.o. M who was seen today for difficulty swallowing and communicating. Pt is nonverbal with only vocalization with open mouth posture. No volitional laryngeal or oral musculature movement observed during eval, and  no cough or clearing throat upon request. MBS is recommended and Referring MD has told SLP it been ordered. Pt indicated today he does not wish  to have PEG or other alternative means of nutrition. SLP recommended thorough oral care x4/day even though pt has not had any PNA to date. Pt's oral stage of swallow was found as absent when pt placed 1/3 teaspoon applesauce in his oral cavity - bolus was removed as best as possible. However, pt did generate pharyngeal stage when SLP placed 10/11/12 teaspoon bolus of water in posterior of pt's oral cavity, x3. Pt with double swallows indicating decr'd pharyngeal clearance. No coughing noted. SLP postulates it is possible that pharyngeal stage of the swallow might decline in function over time - CT scan that is scheduled for 09/08/23 may shed light on this probability.   OBJECTIVE IMPAIRMENTS: include memory, dysarthria, voice disorder, and dysphagia. These impairments are limiting patient from ADLs/IADLs, effectively communicating at home and in community, and safety when swallowing. Factors affecting potential to achieve goals and functional outcome are cooperation/participation level, medical prognosis, previous level of function, and severity of impairments. Patient will benefit from skilled SLP services to address above impairments and improve overall function.  REHAB POTENTIAL: Fair based upon neuro hx, presenting sx   GOALS: Goals reviewed with patient? No    LONG TERM GOALS: Target date: 10/07/23  Pt will demo knowledge of components of thorough oral care and frequency Baseline:  Goal status: INITIAL  2.  Pt will demo functional communication means with SLP (typing or writing) during 2 consecutive sessions Baseline:  Goal status: INITIAL  3.  Pt and family will demo knowledge of overt s/sx PNA with modified independence Baseline:  Goal status: INITIAL  4.  Pt will improve PROM score compared to initial administration Baseline:  Goal status: INITIAL  PLAN:  SLP FREQUENCY: 1x/week  SLP DURATION: 4 weeks  PLANNED INTERVENTIONS: 92526 Treatment of swallowing function, 92507  Treatment of speech (30 or 45 min) , Aspiration precaution training, Diet toleration management , Environmental controls, Trials of upgraded texture/liquids, Internal/external aids, Functional tasks, SLP instruction and feedback, Compensatory strategies, Patient/family education, and MBS    Kiev Labrosse, CCC-SLP 08/31/2023, 10:45 PM

## 2023-09-05 ENCOUNTER — Ambulatory Visit: Payer: Medicare Other | Attending: Neurology

## 2023-09-05 DIAGNOSIS — R2681 Unsteadiness on feet: Secondary | ICD-10-CM | POA: Insufficient documentation

## 2023-09-05 DIAGNOSIS — M6281 Muscle weakness (generalized): Secondary | ICD-10-CM | POA: Insufficient documentation

## 2023-09-05 DIAGNOSIS — R2689 Other abnormalities of gait and mobility: Secondary | ICD-10-CM | POA: Diagnosis not present

## 2023-09-05 NOTE — Therapy (Signed)
OUTPATIENT PHYSICAL THERAPY NEURO TREATMENT   Patient Name: Adam [name] MRN: [id number] DOB:November 09, 1950, 72 y.o., male Today's Date: 09/05/2023   PCP: Evelena Peat, MD REFERRING PROVIDER: Windell Norfolk, MD  END OF SESSION:  PT End of Session - 09/05/23 1148     Visit Number 3    Number of Visits 12    Date for PT Re-Evaluation 10/10/23    Authorization Type [employer] Medicare    Progress Note Due on Visit 10    PT Start Time 1145    PT Stop Time 1230    PT Time Calculation (min) 45 min             Past Medical History:  Diagnosis Date   Alcohol withdrawal (HCC) 10/24/2019   Allergy    Anxiety    history of PTSD following CABG   Ascending aortic aneurysm (HCC) 01/31/2018   43 x 42 mm, pt unaware   Asthma    Cardiomegaly 10/17/2017   Colitis- colonoscopy 2014 07/13/2015   COPD (chronic obstructive pulmonary disease) (HCC)    Coronary artery disease    x 6   Depression    Diabetes mellitus without complication (HCC)    Family history of polyps in the colon    Finger dislocation    Left pinkie   GERD (gastroesophageal reflux disease)    Gout    H/O atrial fibrillation without current medication    following CABG with no documented episodes since then.   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   Past Surgical History:  Procedure Laterality Date   ANKLE FRACTURE SURGERY Right 1991   APPENDECTOMY     ATRIAL FIBRILLATION ABLATION N/A 03/31/2018   Procedure: ATRIAL FIBRILLATION ABLATION;  Surgeon: Hillis Range, MD;  Location: [facility];  Service: Cardiovascular;  Laterality: N/A;   CARDIAC ELECTROPHYSIOLOGY MAPPING AND ABLATION     CARDIOVERSION N/A  03/23/2021   Procedure: CARDIOVERSION;  Surgeon: Elease Hashimoto Deloris Ping, MD;  Location: [facility] ENDOSCOPY;  Service: Cardiovascular;  Laterality: N/A;   COLONOSCOPY  06/11/2021   COLONOSCOPY W/ BIOPSIES  2017   x7   CORONARY ANGIOPLASTY WITH STENT PLACEMENT     CORONARY ARTERY BYPASS GRAFT     FINGER SURGERY  04/2018   Small finger left hand   implantable loop recorder placement  07/02/2019   Medtronic Reveal Iago model LNQ11 (SN [id number]) implanted in office by Dr Johney Frame   TEE WITHOUT CARDIOVERSION N/A 03/30/2018   Procedure: TRANSESOPHAGEAL ECHOCARDIOGRAM (TEE);  Surgeon: Thurmon Fair, MD;  Location: [facility] ENDOSCOPY;  Service: Cardiovascular;  Laterality: N/A;   TEE WITHOUT CARDIOVERSION N/A 03/23/2021   Procedure: TRANSESOPHAGEAL ECHOCARDIOGRAM (TEE);  Surgeon: Elease Hashimoto Deloris Ping, MD;  Location: [facility] ENDOSCOPY;  Service: Cardiovascular;  Laterality: N/A;   TOTAL HIP ARTHROPLASTY Left    TOTAL HIP ARTHROPLASTY Right 09/12/2018   Procedure: TOTAL HIP ARTHROPLASTY ANTERIOR APPROACH;  Surgeon: Durene Romans, MD;  Location: [facility] ORS;  Service: Orthopedics;  Laterality: Right;  70 mins   Patient Active Problem List   Diagnosis Date Noted   COPD with asthma (HCC) 03/02/2022   Wernicke encephalopathy 10/14/2021   Psoriasis 10/14/2021  Hypokalemia 07/30/2021   Hypertension 06/05/2020   Pressure injury of skin 02/21/2020   Rapid atrial fibrillation (HCC) 02/20/2020   High anion gap metabolic acidosis 02/08/2020   Hypercoagulable state due to persistent atrial fibrillation (HCC) 11/02/2019   Paroxysmal atrial fibrillation (HCC) 10/24/2019   Degeneration of lumbar intervertebral disc 09/27/2019   Thoracic aortic aneurysm (HCC) 02/08/2019   Hemoptysis 10/06/2018   S/P right THA, 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   S/P hip replacement 09/12/2018   Low back pain 05/19/2018   Sleep apnea 01/09/2018   Persistent atrial fibrillation 12/12/2017   Allergic rhinitis 08/22/2015   Family history of colon cancer in mother deceased  age 55 01-Aug-2015   Type 2 diabetes mellitus, controlled (HCC) 07/17/2013   COPD with acute exacerbation (HCC) 07/05/2013   CAD (coronary artery disease) 06/26/2013   Hypertension associated with diabetes (HCC) 06/26/2013   Hyperlipidemia associated with type 2 diabetes mellitus (HCC) 06/26/2013   GERD (gastroesophageal reflux disease) 06/26/2013   History of atrial fibrillation without current medication 06/26/2013   Gout 06/26/2013   Osteoarthritis 06/26/2013   Asthma, mild persistent 06/26/2013   Alcohol abuse 06/26/2013   PTSD (post-traumatic stress disorder) 06/26/2013   HLA B27 (HLA B27 positive) 06/26/2013   Obesity (BMI 30-39.9) 06/26/2013   Benign essential hypertension 06/26/2013    ONSET DATE: 10/14/21  REFERRING DIAG: R29.6 (ICD-10-CM) - Recurrent falls  THERAPY DIAG:  Unsteadiness on feet  Other abnormalities of gait and mobility  Muscle weakness (generalized)  Rationale for Evaluation and Treatment: Rehabilitation  SUBJECTIVE:                                                                                                                                                                                             SUBJECTIVE STATEMENT: Doing ok, no falls as of late. Pt accompanied by: significant other [name]  PERTINENT HISTORY: Chronic alcohol, COPD, Heart disease, DM, Gout, Depression, HLD, bilat THR, hx of CABG. He is able to understand and communicate with writing and gesture, sometimes gets frustrated when wife does not understand him.  He is still having gait abnormality.  Even with the walker, he continued to fall.  He continues to drink alcohol daily, even though he decreased the amount of alcohol.  Wife tells me that he has lost a lot of weight almost 100 pounds due to the fact that he is choking, he cannot swallow, he drools all the time.  They tell me that they have not seen GI or have any new swallow study or endoscopy done. He is still have sign of  his Wernicke including pseudobulbar affect   PAIN:  Are  you having pain? No, right shoulder is sore from recent fall  PRECAUTIONS: Fall  RED FLAGS: None   WEIGHT BEARING RESTRICTIONS: No  FALLS: Has patient fallen in last 6 months?  "Too many to count"  LIVING ENVIRONMENT: Lives with: lives with their spouse Lives in: House/apartment Stairs: No Has following equipment at home: Environmental consultant - 4 wheeled and Grab bars  PLOF: Independent with basic ADLs and Independent with household mobility with device  PATIENT GOALS: "walk without falling. Be able to walk the dog "Jessie" (medium sized dog). Be able to get up from ground after a fall.  OBJECTIVE:   TODAY'S TREATMENT: 09/05/23 Activity Comments  Floor to stand recovery   Multisensory balance activities   Retro-walk CGA   Sidestep w/ CGA            TODAY'S TREATMENT: 08/31/23 Activity Comments  Floor to stand recovery Max A, unable to achieve quadruped without assist  LAQ 3x10 5#  Standing march 2x10 5#  Sit to stand 2x5 reps Elevated EOM, single UE support  Deadlift eccentrics 2x5 15# KB performing eccentric lowering only  Standing on foam -static x 30 sec. Head/body turns 3x    PATIENT EDUCATION: Education details: assessment details, rationale of PT intervention, HEP initiation Person educated: Patient and Spouse Education method: Explanation and Handouts Education comprehension: verbalized understanding  HOME EXERCISE PROGRAM: Access Code: [facility] URL: [email or web address] Date: 08/29/2023 Prepared by: Shary Decamp  Exercises - Standing Single Leg Stance with Counter Support  - 1 x daily - 7 x weekly - 3 sets - 10 sec hold - Side Stepping with Counter Support  - 1 x daily - 7 x weekly - 1-3 sets - 2 min hold  Note: Objective measures were completed at Evaluation unless otherwise noted.  DIAGNOSTIC FINDINGS: N/A for this episode  COGNITION: Overall cognitive status: History of cognitive  impairments - at baseline   SENSATION: Not tested, reports neuropathy in BLE  COORDINATION: Difficulty with rapid alternating movements Heel to shin impaired LLE>RLE Unable to close eyes to perform finger to nose  EDEMA:  None present  MUSCLE TONE: NT  MUSCLE LENGTH: WFL  DTRs:  NT  POSTURE: No Significant postural limitations  LOWER EXTREMITY ROM:     Active  Right Eval Left Eval  Hip flexion 110+ 110+  Hip extension    Hip abduction    Hip adduction    Hip internal rotation    Hip external rotation    Knee flexion 120 120  Knee extension 0 0  Ankle dorsiflexion 15 15  Ankle plantarflexion    Ankle inversion    Ankle eversion     (Blank rows = not tested)  LOWER EXTREMITY MMT:    MMT Right Eval Left Eval  Hip flexion 3+ 3+  Hip extension    Hip abduction 3+ 3+  Hip adduction 3+ 3+  Hip internal rotation    Hip external rotation    Knee flexion 5 5  Knee extension 4 4  Ankle dorsiflexion 4 4  Ankle plantarflexion    Ankle inversion    Ankle eversion    (Blank rows = not tested)  BED MOBILITY:  Independent  TRANSFERS: Assistive device utilized: Environmental consultant - 4 wheeled  Sit to stand: Modified independence and SBA Stand to sit: Modified independence Chair to chair: Modified independence and SBA Floor:  NT but spouse reports usually requires male friend to assist when performing fall recovery from floor  RAMP:  Level of Assistance: SBA and  CGA Assistive device utilized: Environmental consultant - 4 wheeled Ramp Comments:   CURB:  Level of Assistance: CGA Assistive device utilized: Environmental consultant - 4 wheeled Curb Comments:   STAIRS: NT, reports no stairs at home  GAIT: Gait pattern:  some instances of shuffling, RLE deficits from hx of orthopedic injuries/surgeries Distance walked:  Assistive device utilized: Environmental consultant - 4 wheeled Level of assistance: Modified independence and SBA Comments:   FUNCTIONAL TESTS:  5 times sit to stand: 1 rep Timed up and go (TUG):  16 sec w/ 4ON Berg Balance Scale: 38/56 Romberg: mild-moderate sway eyes open, unable to volitionally close eyes. Therapist obstructs vision with paper which caused worse imbalance    GOALS: Goals reviewed with patient? Yes  SHORT TERM GOALS: Target date: 09/19/2023    Patient will be independent in HEP to improve functional outcomes Baseline: Goal status: IN PROGRESS  2.  Demo improved balance and BLE strength as evidenced by ability to perform 5xSTS test in 25 sec Baseline: able to complete 1 repetition Goal status: IN PROGRESS  3.  Demo improved postural control exhibiting mild sway with Romberg position to improve safety with ADL Baseline: moderate Goal status: IN PROGRESS    LONG TERM GOALS: Target date: 10/10/2023    Demo reduced risk for falls per score 49/56 Berg Balance Test Baseline: 38/56 Goal status: INITIAL  2.  Exhibit reduced risk for falls per time 12 sec TUG test Baseline: 16 sec w/ 6EX Goal status: INITIAL  3.  Demo improved BLE strength and reduced risk for falls per time of 15 sec 5xSTS test Baseline: 1 rep Goal status: INITIAL  4.  Demo modified independent floor to stand to improve capabilities for fall recovery Baseline: spouse reports dependence on physical assistance Goal status: INITIAL  5.  Demo ability to safely walk dog x 10 min to increase activity participation Baseline:  Goal status: INITIAL    ASSESSMENT:  CLINICAL IMPRESSION: Continued with floor to stand recovery performing constituent parts for repetition to improve ability to move from supine to prone to quadruped w/ half kneeling assumed to push to sitting EOM requiring mod A for support and cues in sequence to enable effective half kneeling. Proceeded with multisensory balance activities to improve stability and facilitate righting reactions and withstand postural perturbations.  Simulated dog walking with rollator to improve stability and safety against pulling animal and  safety with forward bending to retrieve items on ground. Retro and sidestepping to promote greater step length and single limb support.  Simulated dog walking wad very difficult and unsteady throughout.   OBJECTIVE IMPAIRMENTS: Abnormal gait, decreased activity tolerance, decreased balance, decreased coordination, decreased endurance, decreased mobility, difficulty walking, decreased strength, and decreased safety awareness.   ACTIVITY LIMITATIONS: carrying, lifting, bending, standing, squatting, transfers, reach over head, and locomotion level  PARTICIPATION LIMITATIONS: meal prep, cleaning, laundry, interpersonal relationship, shopping, community activity, and dog walking  PERSONAL FACTORS: Age, Time since onset of injury/illness/exacerbation, and 3+ comorbidities: PMH  are also affecting patient's functional outcome.   REHAB POTENTIAL: Good  CLINICAL DECISION MAKING: Evolving/moderate complexity  EVALUATION COMPLEXITY: Moderate  PLAN:  PT FREQUENCY: 2x/week  PT DURATION: 6 weeks  PLANNED INTERVENTIONS: 97110-Therapeutic exercises, 97530- Therapeutic activity, 97112- Neuromuscular re-education, 97535- Self Care, [id number]- Manual therapy, [phone]- Gait training, [phone]- Canalith repositioning, [phone]- Aquatic Therapy, Patient/Family education, Balance training, Stair training, Taping, Dry Needling, Joint mobilization, Spinal mobilization, Vestibular training, DME instructions, and Wheelchair mobility training  PLAN FOR NEXT SESSION: HEP review , lunge to stand with  furniture for LE strength/fall recovery, general strength/balance   11:50 AM, 09/05/23 M. Shary Decamp, PT, DPT Physical Therapist- [place] Office Number: [phone]

## 2023-09-07 ENCOUNTER — Ambulatory Visit: Payer: Medicare Other

## 2023-09-07 DIAGNOSIS — R2689 Other abnormalities of gait and mobility: Secondary | ICD-10-CM | POA: Diagnosis not present

## 2023-09-07 DIAGNOSIS — M6281 Muscle weakness (generalized): Secondary | ICD-10-CM

## 2023-09-07 DIAGNOSIS — R2681 Unsteadiness on feet: Secondary | ICD-10-CM

## 2023-09-07 NOTE — Therapy (Signed)
OUTPATIENT PHYSICAL THERAPY NEURO TREATMENT   Patient Name: Adam [name] MRN: [id number] DOB:[birth year], 72 y.o., male Today's Date: 09/07/2023   PCP: Evelena Peat, MD REFERRING PROVIDER: Windell Norfolk, MD  END OF SESSION:  PT End of Session - 09/07/23 1312     Visit Number 4    Number of Visits 12    Date for PT Re-Evaluation 10/10/23    Authorization Type [employer] Medicare    Progress Note Due on Visit 10    PT Start Time 1315    PT Stop Time 1400    PT Time Calculation (min) 45 min             Past Medical History:  Diagnosis Date   Alcohol withdrawal (HCC) 10/24/2019   Allergy    Anxiety    history of PTSD following CABG   Ascending aortic aneurysm (HCC) 01/31/2018   43 x 42 mm, pt unaware   Asthma    Cardiomegaly 10/17/2017   Colitis- colonoscopy 2014 07/13/2015   COPD (chronic obstructive pulmonary disease) (HCC)    Coronary artery disease    x 6   Depression    Diabetes mellitus without complication (HCC)    Family history of polyps in the colon    Finger dislocation    Left pinkie   GERD (gastroesophageal reflux disease)    Gout    H/O atrial fibrillation without current medication    following CABG with no documented episodes since then.   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   Past Surgical History:  Procedure Laterality Date   ANKLE FRACTURE SURGERY Right 1991   APPENDECTOMY     ATRIAL FIBRILLATION ABLATION N/A 03/31/2018   Procedure: ATRIAL FIBRILLATION ABLATION;  Surgeon: Hillis Range, MD;  Location: [facility];  Service: Cardiovascular;  Laterality: N/A;   CARDIAC ELECTROPHYSIOLOGY MAPPING AND ABLATION     CARDIOVERSION N/A  03/23/2021   Procedure: CARDIOVERSION;  Surgeon: Elease Hashimoto Deloris Ping, MD;  Location: [facility] ENDOSCOPY;  Service: Cardiovascular;  Laterality: N/A;   COLONOSCOPY  06/11/2021   COLONOSCOPY W/ BIOPSIES  2017   x7   CORONARY ANGIOPLASTY WITH STENT PLACEMENT     CORONARY ARTERY BYPASS GRAFT     FINGER SURGERY  04/2018   Small finger left hand   implantable loop recorder placement  07/02/2019   [employer]onic Reveal [place] model LNQ11 (SN [id number]) implanted in office by Dr Johney Frame   TEE WITHOUT CARDIOVERSION N/A 03/30/2018   Procedure: TRANSESOPHAGEAL ECHOCARDIOGRAM (TEE);  Surgeon: Thurmon Fair, MD;  Location: [facility] ENDOSCOPY;  Service: Cardiovascular;  Laterality: N/A;   TEE WITHOUT CARDIOVERSION N/A 03/23/2021   Procedure: TRANSESOPHAGEAL ECHOCARDIOGRAM (TEE);  Surgeon: Elease Hashimoto Deloris Ping, MD;  Location: [facility] ENDOSCOPY;  Service: Cardiovascular;  Laterality: N/A;   TOTAL HIP ARTHROPLASTY Left    TOTAL HIP ARTHROPLASTY Right 09/12/2018   Procedure: TOTAL HIP ARTHROPLASTY ANTERIOR APPROACH;  Surgeon: Durene Romans, MD;  Location: [facility] ORS;  Service: Orthopedics;  Laterality: Right;  70 mins   Patient Active Problem List   Diagnosis Date Noted   COPD with asthma (HCC) 03/02/2022   Wernicke encephalopathy 10/14/2021   Psoriasis 10/14/2021  Hypokalemia 07/30/2021   Hypertension 06/05/2020   Pressure injury of skin 02/21/2020   Rapid atrial fibrillation (HCC) 02/20/2020   High anion gap metabolic acidosis 02/08/2020   Hypercoagulable state due to persistent atrial fibrillation (HCC) 11/02/2019   Paroxysmal atrial fibrillation (HCC) 10/24/2019   Degeneration of lumbar intervertebral disc 09/27/2019   Thoracic aortic aneurysm (HCC) 02/08/2019   Hemoptysis 10/06/2018   S/P right THA, 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   S/P hip replacement 09/12/2018   Low back pain 05/19/2018   Sleep apnea 01/09/2018   Persistent atrial fibrillation 12/12/2017   Allergic rhinitis 08/22/2015   Family history of colon cancer in mother deceased  age 52 Jul 21, 2015   Type 2 diabetes mellitus, controlled (HCC) 07/17/2013   COPD with acute exacerbation (HCC) 07/05/2013   CAD (coronary artery disease) 06/26/2013   Hypertension associated with diabetes (HCC) 06/26/2013   Hyperlipidemia associated with type 2 diabetes mellitus (HCC) 06/26/2013   GERD (gastroesophageal reflux disease) 06/26/2013   History of atrial fibrillation without current medication 06/26/2013   Gout 06/26/2013   Osteoarthritis 06/26/2013   Asthma, mild persistent 06/26/2013   Alcohol abuse 06/26/2013   PTSD (post-traumatic stress disorder) 06/26/2013   HLA B27 (HLA B27 positive) 06/26/2013   Obesity (BMI 30-39.9) 06/26/2013   Benign essential hypertension 06/26/2013    ONSET DATE: 10/14/21  REFERRING DIAG: R29.6 (ICD-10-CM) - Recurrent falls  THERAPY DIAG:  Unsteadiness on feet  Other abnormalities of gait and mobility  Muscle weakness (generalized)  Rationale for Evaluation and Treatment: Rehabilitation  SUBJECTIVE:                                                                                                                                                                                             SUBJECTIVE STATEMENT: Doing ok, no new issues Pt accompanied by: significant other [name]  PERTINENT HISTORY: Chronic alcohol, COPD, Heart disease, DM, Gout, Depression, HLD, bilat THR, hx of CABG. He is able to understand and communicate with writing and gesture, sometimes gets frustrated when wife does not understand him.  He is still having gait abnormality.  Even with the walker, he continued to fall.  He continues to drink alcohol daily, even though he decreased the amount of alcohol.  Wife tells me that he has lost a lot of weight almost 100 pounds due to the fact that he is choking, he cannot swallow, he drools all the time.  They tell me that they have not seen GI or have any new swallow study or endoscopy done. He is still have sign of his  Wernicke including pseudobulbar affect   PAIN:  Are you having  pain? No, right shoulder is sore from recent fall  PRECAUTIONS: Fall  RED FLAGS: None   WEIGHT BEARING RESTRICTIONS: No  FALLS: Has patient fallen in last 6 months?  "Too many to count"  LIVING ENVIRONMENT: Lives with: lives with their spouse Lives in: House/apartment Stairs: No Has following equipment at home: Environmental consultant - 4 wheeled and Grab bars  PLOF: Independent with basic ADLs and Independent with household mobility with device  PATIENT GOALS: "walk without falling. Be able to walk the dog "Jessie" (medium sized dog). Be able to get up from ground after a fall.  OBJECTIVE:   TODAY'S TREATMENT: 09/07/23 Activity Comments  Seated AROM 2x10 for warm-up. Sit to stand 5x.Standing march 1x10   Floor to sit recovery Lowering to floor. Assuming quadruped to half kneeling to seat recovery EOM x 2 then chair  HEP review 0% recall. Reviewed and reinforced  Mini-squats 3x5  At sink w/ rollator behind to assist in form  Multi-sensory balance Compliant surfaces, postural perturbations, rocker board           PATIENT EDUCATION: Education details: assessment details, rationale of PT intervention, HEP initiation Person educated: Patient and Spouse Education method: Explanation and Handouts Education comprehension: verbalized understanding  HOME EXERCISE PROGRAM: Access Code: [facility] URL: [email or web address] Date: 08/29/2023 Prepared by: Shary Decamp  Exercises - Standing Single Leg Stance with Counter Support  - 1 x daily - 7 x weekly - 3 sets - 10 sec hold - Side Stepping with Counter Support  - 1 x daily - 7 x weekly - 1-3 sets - 2 min hold - Mini Squat with Counter Support  - 1 x daily - 7 x weekly - 3 sets - 5 reps  Note: Objective measures were completed at Evaluation unless otherwise noted.  DIAGNOSTIC FINDINGS: N/A for this episode  COGNITION: Overall cognitive status: History of  cognitive impairments - at baseline   SENSATION: Not tested, reports neuropathy in BLE  COORDINATION: Difficulty with rapid alternating movements Heel to shin impaired LLE>RLE Unable to close eyes to perform finger to nose  EDEMA:  None present  MUSCLE TONE: NT  MUSCLE LENGTH: WFL  DTRs:  NT  POSTURE: No Significant postural limitations  LOWER EXTREMITY ROM:     Active  Right Eval Left Eval  Hip flexion 110+ 110+  Hip extension    Hip abduction    Hip adduction    Hip internal rotation    Hip external rotation    Knee flexion 120 120  Knee extension 0 0  Ankle dorsiflexion 15 15  Ankle plantarflexion    Ankle inversion    Ankle eversion     (Blank rows = not tested)  LOWER EXTREMITY MMT:    MMT Right Eval Left Eval  Hip flexion 3+ 3+  Hip extension    Hip abduction 3+ 3+  Hip adduction 3+ 3+  Hip internal rotation    Hip external rotation    Knee flexion 5 5  Knee extension 4 4  Ankle dorsiflexion 4 4  Ankle plantarflexion    Ankle inversion    Ankle eversion    (Blank rows = not tested)  BED MOBILITY:  Independent  TRANSFERS: Assistive device utilized: Environmental consultant - 4 wheeled  Sit to stand: Modified independence and SBA Stand to sit: Modified independence Chair to chair: Modified independence and SBA Floor:  NT but spouse reports usually requires male friend to assist when performing fall recovery from floor  RAMP:  Level of Assistance: SBA  and CGA Assistive device utilized: Environmental consultant - 4 wheeled Ramp Comments:   CURB:  Level of Assistance: CGA Assistive device utilized: Environmental consultant - 4 wheeled Curb Comments:   STAIRS: NT, reports no stairs at home  GAIT: Gait pattern:  some instances of shuffling, RLE deficits from hx of orthopedic injuries/surgeries Distance walked:  Assistive device utilized: Environmental consultant - 4 wheeled Level of assistance: Modified independence and SBA Comments:   FUNCTIONAL TESTS:  5 times sit to stand: 1 rep Timed up and  go (TUG): 16 sec w/ 9BM Berg Balance Scale: 38/56 Romberg: mild-moderate sway eyes open, unable to volitionally close eyes. Therapist obstructs vision with paper which caused worse imbalance    GOALS: Goals reviewed with patient? Yes  SHORT TERM GOALS: Target date: 09/19/2023    Patient will be independent in HEP to improve functional outcomes Baseline: Goal status: IN PROGRESS  2.  Demo improved balance and BLE strength as evidenced by ability to perform 5xSTS test in 25 sec Baseline: able to complete 1 repetition Goal status: IN PROGRESS  3.  Demo improved postural control exhibiting mild sway with Romberg position to improve safety with ADL Baseline: moderate Goal status: IN PROGRESS    LONG TERM GOALS: Target date: 10/10/2023    Demo reduced risk for falls per score 49/56 Berg Balance Test Baseline: 38/56 Goal status: INITIAL  2.  Exhibit reduced risk for falls per time 12 sec TUG test Baseline: 16 sec w/ 8UX Goal status: INITIAL  3.  Demo improved BLE strength and reduced risk for falls per time of 15 sec 5xSTS test Baseline: 1 rep Goal status: INITIAL  4.  Demo modified independent floor to stand to improve capabilities for fall recovery Baseline: spouse reports dependence on physical assistance Goal status: INITIAL  5.  Demo ability to safely walk dog x 10 min to increase activity participation Baseline:  Goal status: INITIAL    ASSESSMENT:  CLINICAL IMPRESSION: Initiated session with LE warm-up and balance to prepare. Proceeded with floor to seat transfer for fall recovery demonstrating marked improvement in strength and sequence for transfer only requiring supervision for cues and able to generalize to transitioning from mat table to chair for floor to seat recovery.  Review of HEP activities with report of non-compliance. Review of activities with safe ability to perform unassisted although recommended to him and spouse use of supervision to ensure safety  and addition of mini-squats with UE support to promote greater strength and ability to transfer.  Multi-sensory balance activities to improve postural stability and facilitate righting reactions with tendency for retro-LOB under demands. Continued sessions would benefit mobility and reduce risk for falls  OBJECTIVE IMPAIRMENTS: Abnormal gait, decreased activity tolerance, decreased balance, decreased coordination, decreased endurance, decreased mobility, difficulty walking, decreased strength, and decreased safety awareness.   ACTIVITY LIMITATIONS: carrying, lifting, bending, standing, squatting, transfers, reach over head, and locomotion level  PARTICIPATION LIMITATIONS: meal prep, cleaning, laundry, interpersonal relationship, shopping, community activity, and dog walking  PERSONAL FACTORS: Age, Time since onset of injury/illness/exacerbation, and 3+ comorbidities: PMH  are also affecting patient's functional outcome.   REHAB POTENTIAL: Good  CLINICAL DECISION MAKING: Evolving/moderate complexity  EVALUATION COMPLEXITY: Moderate  PLAN:  PT FREQUENCY: 2x/week  PT DURATION: 6 weeks  PLANNED INTERVENTIONS: 97110-Therapeutic exercises, 97530- Therapeutic activity, 97112- Neuromuscular re-education, 97535- Self Care, 32440- Manual therapy, [phone]- Gait training, [phone]- Canalith repositioning, [phone]- Aquatic Therapy, Patient/Family education, Balance training, Stair training, Taping, Dry Needling, Joint mobilization, Spinal mobilization, Vestibular training, DME instructions, and Wheelchair  mobility training  PLAN FOR NEXT SESSION: HEP review , lunge to stand with furniture for LE strength/fall recovery, general strength/balance   1:13 PM, 09/07/23 M. Shary Decamp, PT, DPT Physical Therapist- [place] Office Number: [phone]

## 2023-09-08 ENCOUNTER — Ambulatory Visit
Admission: RE | Admit: 2023-09-08 | Discharge: 2023-09-08 | Disposition: A | Discharge status: Home / Self Care | Payer: Medicare Other | Source: Ambulatory Visit | Attending: Neurology | Admitting: Neurology

## 2023-09-08 DIAGNOSIS — G1229 Other motor neuron disease: Secondary | ICD-10-CM

## 2023-09-08 DIAGNOSIS — G9389 Other specified disorders of brain: Secondary | ICD-10-CM | POA: Diagnosis not present

## 2023-09-08 DIAGNOSIS — R4701 Aphasia: Secondary | ICD-10-CM | POA: Diagnosis not present

## 2023-09-11 ENCOUNTER — Other Ambulatory Visit: Payer: Self-pay | Admitting: Family Medicine

## 2023-09-12 ENCOUNTER — Ambulatory Visit: Payer: Medicare Other

## 2023-09-12 DIAGNOSIS — R2681 Unsteadiness on feet: Secondary | ICD-10-CM | POA: Diagnosis not present

## 2023-09-12 DIAGNOSIS — M6281 Muscle weakness (generalized): Secondary | ICD-10-CM

## 2023-09-12 DIAGNOSIS — R2689 Other abnormalities of gait and mobility: Secondary | ICD-10-CM

## 2023-09-12 NOTE — Therapy (Signed)
OUTPATIENT PHYSICAL THERAPY NEURO TREATMENT   Patient Name: Adam [name] MRN: [id number] DOB:10/09/1950, 72 y.o., male Today's Date: 09/12/2023   PCP: Evelena Peat, MD REFERRING PROVIDER: Windell Norfolk, MD  END OF SESSION:  PT End of Session - 09/12/23 1310     Visit Number 5    Number of Visits 12    Date for PT Re-Evaluation 10/10/23    Authorization Type BCBS Medicare    Progress Note Due on Visit 10    PT Start Time 1315    PT Stop Time 1400    PT Time Calculation (min) 45 min             Past Medical History:  Diagnosis Date   Alcohol withdrawal (HCC) 10/24/2019   Allergy    Anxiety    history of PTSD following CABG   Ascending aortic aneurysm (HCC) 01/31/2018   43 x 42 mm, pt unaware   Asthma    Cardiomegaly 10/17/2017   Colitis- colonoscopy 2014 07/13/2015   COPD (chronic obstructive pulmonary disease) (HCC)    Coronary artery disease    x 6   Depression    Diabetes mellitus without complication (HCC)    Family history of polyps in the colon    Finger dislocation    Left pinkie   GERD (gastroesophageal reflux disease)    Gout    H/O atrial fibrillation without current medication    following CABG with no documented episodes since then.   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   Past Surgical History:  Procedure Laterality Date   ANKLE FRACTURE SURGERY Right 1991   APPENDECTOMY     ATRIAL FIBRILLATION ABLATION N/A 03/31/2018   Procedure: ATRIAL FIBRILLATION ABLATION;  Surgeon: Hillis Range, MD;  Location: [facility];  Service: Cardiovascular;  Laterality: N/A;   CARDIAC ELECTROPHYSIOLOGY MAPPING AND ABLATION     CARDIOVERSION N/A  03/23/2021   Procedure: CARDIOVERSION;  Surgeon: Elease Hashimoto Deloris Ping, MD;  Location: [facility] ENDOSCOPY;  Service: Cardiovascular;  Laterality: N/A;   COLONOSCOPY  06/11/2021   COLONOSCOPY W/ BIOPSIES  2017   x7   CORONARY ANGIOPLASTY WITH STENT PLACEMENT     CORONARY ARTERY BYPASS GRAFT     FINGER SURGERY  04/2018   Small finger left hand   implantable loop recorder placement  07/02/2019   Medtronic Reveal Mosier model LNQ11 (SN [id number]) implanted in office by Dr Johney Frame   TEE WITHOUT CARDIOVERSION N/A 03/30/2018   Procedure: TRANSESOPHAGEAL ECHOCARDIOGRAM (TEE);  Surgeon: Thurmon Fair, MD;  Location: [facility] ENDOSCOPY;  Service: Cardiovascular;  Laterality: N/A;   TEE WITHOUT CARDIOVERSION N/A 03/23/2021   Procedure: TRANSESOPHAGEAL ECHOCARDIOGRAM (TEE);  Surgeon: Elease Hashimoto Deloris Ping, MD;  Location: [facility] ENDOSCOPY;  Service: Cardiovascular;  Laterality: N/A;   TOTAL HIP ARTHROPLASTY Left    TOTAL HIP ARTHROPLASTY Right 09/12/2018   Procedure: TOTAL HIP ARTHROPLASTY ANTERIOR APPROACH;  Surgeon: Durene Romans, MD;  Location: [facility] ORS;  Service: Orthopedics;  Laterality: Right;  70 mins   Patient Active Problem List   Diagnosis Date Noted   COPD with asthma (HCC) 03/02/2022   Wernicke encephalopathy 10/14/2021   Psoriasis 10/14/2021  Hypokalemia 07/30/2021   Hypertension 06/05/2020   Pressure injury of skin 02/21/2020   Rapid atrial fibrillation (HCC) 02/20/2020   High anion gap metabolic acidosis 02/08/2020   Hypercoagulable state due to persistent atrial fibrillation (HCC) 11/02/2019   Paroxysmal atrial fibrillation (HCC) 10/24/2019   Degeneration of lumbar intervertebral disc 09/27/2019   Thoracic aortic aneurysm (HCC) 02/08/2019   Hemoptysis 10/06/2018   S/P right THA, 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   S/P hip replacement 09/12/2018   Low back pain 05/19/2018   Sleep apnea 01/09/2018   Persistent atrial fibrillation 12/12/2017   Allergic rhinitis 08/22/2015   Family history of colon cancer in mother deceased  age 80 07-18-15   Type 2 diabetes mellitus, controlled (HCC) 07/17/2013   COPD with acute exacerbation (HCC) 07/05/2013   CAD (coronary artery disease) 06/26/2013   Hypertension associated with diabetes (HCC) 06/26/2013   Hyperlipidemia associated with type 2 diabetes mellitus (HCC) 06/26/2013   GERD (gastroesophageal reflux disease) 06/26/2013   History of atrial fibrillation without current medication 06/26/2013   Gout 06/26/2013   Osteoarthritis 06/26/2013   Asthma, mild persistent 06/26/2013   Alcohol abuse 06/26/2013   PTSD (post-traumatic stress disorder) 06/26/2013   HLA B27 (HLA B27 positive) 06/26/2013   Obesity (BMI 30-39.9) 06/26/2013   Benign essential hypertension 06/26/2013    ONSET DATE: 10/14/21  REFERRING DIAG: R29.6 (ICD-10-CM) - Recurrent falls  THERAPY DIAG:  Unsteadiness on feet  Other abnormalities of gait and mobility  Muscle weakness (generalized)  Rationale for Evaluation and Treatment: Rehabilitation  SUBJECTIVE:                                                                                                                                                                                             SUBJECTIVE STATEMENT: Had a fall yesterday, unable to arise, needed 3 people to assist to feet. Hit head on cabinet knob, skin tears along right arm Pt accompanied by: significant other [name]  PERTINENT HISTORY: Chronic alcohol, COPD, Heart disease, DM, Gout, Depression, HLD, bilat THR, hx of CABG. He is able to understand and communicate with writing and gesture, sometimes gets frustrated when wife does not understand him.  He is still having gait abnormality.  Even with the walker, he continued to fall.  He continues to drink alcohol daily, even though he decreased the amount of alcohol.  Wife tells me that he has lost a lot of weight almost 100 pounds due to the fact that he is choking, he cannot swallow, he drools all the time.  They tell me that  they have not seen GI or have any new swallow study or endoscopy  done. He is still have sign of his Wernicke including pseudobulbar affect   PAIN:  Are you having pain? No, right shoulder is sore from recent fall  PRECAUTIONS: Fall  RED FLAGS: None   WEIGHT BEARING RESTRICTIONS: No  FALLS: Has patient fallen in last 6 months?  "Too many to count"  LIVING ENVIRONMENT: Lives with: lives with their spouse Lives in: House/apartment Stairs: No Has following equipment at home: Environmental consultant - 4 wheeled and Grab bars  PLOF: Independent with basic ADLs and Independent with household mobility with device  PATIENT GOALS: "walk without falling. Be able to walk the dog "Jessie" (medium sized dog). Be able to get up from ground after a fall.  OBJECTIVE:   TODAY'S TREATMENT: 09/12/23 Activity Comments  Fall recovery training -lowered to ground. Movement from prone to quadruped min-mod A. Half-kneeling to seat recovery min-mod A  HEP review 100% recall  Multisensory balance activities on foam -on compliant surfaces (unable to actively close eyes) -lateral/forward cone taps x 60 sec -forward squat-touch 6" height target on ground w/ unilat UE support 1x10  Large amplitude pivot turns   Resisted sit-stand 5x5 Blue t-band, elevated EOM  Retro-walk 1x45 ft CGA W/ rollator        PATIENT EDUCATION: Education details: assessment details, rationale of PT intervention, HEP initiation Person educated: Patient and Spouse Education method: Explanation and Handouts Education comprehension: verbalized understanding  HOME EXERCISE PROGRAM: Access Code: [facility] URL: [email or web address] Date: 08/29/2023 Prepared by: Shary Decamp  Exercises - Standing Single Leg Stance with Counter Support  - 1 x daily - 7 x weekly - 3 sets - 10 sec hold - Side Stepping with Counter Support  - 1 x daily - 7 x weekly - 1-3 sets - 2 min hold - Mini Squat with Counter Support  - 1 x daily - 7 x weekly -  3 sets - 5 reps  Note: Objective measures were completed at Evaluation unless otherwise noted.  DIAGNOSTIC FINDINGS: N/A for this episode  COGNITION: Overall cognitive status: History of cognitive impairments - at baseline   SENSATION: Not tested, reports neuropathy in BLE  COORDINATION: Difficulty with rapid alternating movements Heel to shin impaired LLE>RLE Unable to close eyes to perform finger to nose  EDEMA:  None present  MUSCLE TONE: NT  MUSCLE LENGTH: WFL  DTRs:  NT  POSTURE: No Significant postural limitations  LOWER EXTREMITY ROM:     Active  Right Eval Left Eval  Hip flexion 110+ 110+  Hip extension    Hip abduction    Hip adduction    Hip internal rotation    Hip external rotation    Knee flexion 120 120  Knee extension 0 0  Ankle dorsiflexion 15 15  Ankle plantarflexion    Ankle inversion    Ankle eversion     (Blank rows = not tested)  LOWER EXTREMITY MMT:    MMT Right Eval Left Eval  Hip flexion 3+ 3+  Hip extension    Hip abduction 3+ 3+  Hip adduction 3+ 3+  Hip internal rotation    Hip external rotation    Knee flexion 5 5  Knee extension 4 4  Ankle dorsiflexion 4 4  Ankle plantarflexion    Ankle inversion    Ankle eversion    (Blank rows = not tested)  BED MOBILITY:  Independent  TRANSFERS: Assistive device utilized: Environmental consultant - 4 wheeled  Sit to stand: Modified independence and SBA Stand to sit: Modified independence Chair to  chair: Modified independence and SBA Floor:  NT but spouse reports usually requires male friend to assist when performing fall recovery from floor  RAMP:  Level of Assistance: SBA and CGA Assistive device utilized: Environmental consultant - 4 wheeled Ramp Comments:   CURB:  Level of Assistance: CGA Assistive device utilized: Environmental consultant - 4 wheeled Curb Comments:   STAIRS: NT, reports no stairs at home  GAIT: Gait pattern:  some instances of shuffling, RLE deficits from hx of orthopedic  injuries/surgeries Distance walked:  Assistive device utilized: Environmental consultant - 4 wheeled Level of assistance: Modified independence and SBA Comments:   FUNCTIONAL TESTS:  5 times sit to stand: 1 rep Timed up and go (TUG): 16 sec w/ 1OX Berg Balance Scale: 38/56 Romberg: mild-moderate sway eyes open, unable to volitionally close eyes. Therapist obstructs vision with paper which caused worse imbalance    GOALS: Goals reviewed with patient? Yes  SHORT TERM GOALS: Target date: 09/19/2023    Patient will be independent in HEP to improve functional outcomes Baseline: Goal status: IN PROGRESS  2.  Demo improved balance and BLE strength as evidenced by ability to perform 5xSTS test in 25 sec Baseline: able to complete 1 repetition Goal status: IN PROGRESS  3.  Demo improved postural control exhibiting mild sway with Romberg position to improve safety with ADL Baseline: moderate Goal status: IN PROGRESS    LONG TERM GOALS: Target date: 10/10/2023    Demo reduced risk for falls per score 49/56 Berg Balance Test Baseline: 38/56 Goal status: INITIAL  2.  Exhibit reduced risk for falls per time 12 sec TUG test Baseline: 16 sec w/ 0RU Goal status: INITIAL  3.  Demo improved BLE strength and reduced risk for falls per time of 15 sec 5xSTS test Baseline: 1 rep Goal status: INITIAL  4.  Demo modified independent floor to stand to improve capabilities for fall recovery Baseline: spouse reports dependence on physical assistance Goal status: INITIAL  5.  Demo ability to safely walk dog x 10 min to increase activity participation Baseline:  Goal status: INITIAL    ASSESSMENT:  CLINICAL IMPRESSION: Reports additional fall at home in kitchen, denies injury other than skin tears along bilateral arms R>L.  Reports unable to perform fall recovery at home requiring 3-assist.  Review of floor to chair recovery from prone to half-kneeling requiring min-mod A to assume positions and min-mod A  from half-kneeling to sit.  Continued with balance activities to improve static and dynamic balance to improve safety within home and mobility with emphasis on placing in precarious positions and movements to induce single limb support and reaching outside BOS with CGA-SBA needed throughout due to imbalance.  Improved HEP recall today completing 3/3 with brief verbal/visual cues for reference.  Focus on repeated large amplitude pivot turns to improve safety with turning in space and reaching outside BOS with supervision performance, no LOB but using slow deliberate movements. Ended with resisted sit to stand to improve LE strength and ability for transfers. Continued sessions to progress POC details to reduce risk for falls.   OBJECTIVE IMPAIRMENTS: Abnormal gait, decreased activity tolerance, decreased balance, decreased coordination, decreased endurance, decreased mobility, difficulty walking, decreased strength, and decreased safety awareness.   ACTIVITY LIMITATIONS: carrying, lifting, bending, standing, squatting, transfers, reach over head, and locomotion level  PARTICIPATION LIMITATIONS: meal prep, cleaning, laundry, interpersonal relationship, shopping, community activity, and dog walking  PERSONAL FACTORS: Age, Time since onset of injury/illness/exacerbation, and 3+ comorbidities: PMH  are also affecting patient's functional  outcome.   REHAB POTENTIAL: Good  CLINICAL DECISION MAKING: Evolving/moderate complexity  EVALUATION COMPLEXITY: Moderate  PLAN:  PT FREQUENCY: 2x/week  PT DURATION: 6 weeks  PLANNED INTERVENTIONS: 97110-Therapeutic exercises, 97530- Therapeutic activity, 97112- Neuromuscular re-education, 97535- Self Care, 09811- Manual therapy, [phone]- Gait training, [phone]- Canalith repositioning, [phone]- Aquatic Therapy, Patient/Family education, Balance training, Stair training, Taping, Dry Needling, Joint mobilization, Spinal mobilization, Vestibular training, DME instructions,  and Wheelchair mobility training  PLAN FOR NEXT SESSION: HEP review , lunge to stand with furniture for LE strength/fall recovery, general strength/balance   1:10 PM, 09/12/23 M. Shary Decamp, PT, DPT Physical Therapist- [place] Office Number: [phone]

## 2023-09-13 ENCOUNTER — Ambulatory Visit (HOSPITAL_COMMUNITY)
Admission: RE | Admit: 2023-09-13 | Discharge: 2023-09-13 | Disposition: A | Discharge status: Home / Self Care | Payer: Medicare Other | Source: Ambulatory Visit | Attending: Family Medicine | Admitting: Family Medicine

## 2023-09-13 DIAGNOSIS — R1310 Dysphagia, unspecified: Secondary | ICD-10-CM | POA: Insufficient documentation

## 2023-09-13 DIAGNOSIS — R059 Cough, unspecified: Secondary | ICD-10-CM

## 2023-09-13 DIAGNOSIS — R4701 Aphasia: Secondary | ICD-10-CM

## 2023-09-13 NOTE — Therapy (Signed)
Modified Barium Swallow Study  Patient Details  Name: ARLESS MOSCHELLA MRN: [id number] Date of Birth: Apr 14, 1951  Today's Date: 09/13/2023  Modified Barium Swallow completed.  Full report located under Chart Review in the Imaging Section.  History of Present Illness Kristy Nygren is a 72 y.o. male with PMH: Wernicke encephalopathy, anxiety, asthma, ETOH abuse, GERD, OA, OSA, RLS, protein calorie malnutrition-severe, squamous cell carcinoma of scalp, COPD, depression. He had attended OP SLP for initial evaluation on 08/31/23 secondary to difficulty swallowing and communicating. During that evaluation, SLP reported that "no volitional laryngeal or oral musculature movement observed" and MBS was recommended prior to any PO recommendations being made. 2022 MBS reported "reduced lingual movement". Patient has reportedly declined to have a PEG placed and has lost approximately 100 lbs in the past year.   Clinical Impression Patient with a profoundly impaired oral phase of swallow as per this modified barium swallow study. No bilabial, lingual or other oral motor musculature movement was observed. SLP presented boluses of nectar thick liquids and puree solids into patient's oral cavity and boluses remained in anterior portion of oral cavity, requiring oral suction to remove. Patient unable to verbally communicate but was able to write and spouse confirmed what he wrote when doing so. He wrote that "I can swallow it is dificult but I can" and when asked how he swallows he wrote "lay flat on bed (pills and yogurt)" After two failed attempts to swallow barium, no further boluses attempted. SLP educated patient and spouse regarding this test and that SLP is unable to make any PO recommendations. Patient gestured to indicate he did not want a feeding tube but then wrote "What is involved w/feeding tube?" and asking "no taste?" He did not seem to adequately understand the gravity of his dysphagia but his wife  indicated that he likely would use a feeding tube for purposes of ingesting alcohol. SLP recommended that patient and spouse have a serious discussion regarding GOC with his PCP and medical care team. Factors that may increase risk of adverse event in presence of aspiration [employer] 2021): Reduced cognitive function;Weak cough;Poor general health and/or compromised immunity  Swallow Evaluation Recommendations Recommendations: NPO;Alternative means of nutrition - G Tube Liquid Administration via: Other (Comment) (PEG) Medication Administration: Via alternative means      Angela Nevin, MA, CCC-SLP Speech Therapy

## 2023-09-14 ENCOUNTER — Ambulatory Visit: Payer: Medicare Other

## 2023-09-14 ENCOUNTER — Other Ambulatory Visit: Payer: Self-pay | Admitting: Family Medicine

## 2023-09-14 DIAGNOSIS — R2681 Unsteadiness on feet: Secondary | ICD-10-CM

## 2023-09-14 DIAGNOSIS — R2689 Other abnormalities of gait and mobility: Secondary | ICD-10-CM | POA: Diagnosis not present

## 2023-09-14 DIAGNOSIS — M6281 Muscle weakness (generalized): Secondary | ICD-10-CM

## 2023-09-14 DIAGNOSIS — E1142 Type 2 diabetes mellitus with diabetic polyneuropathy: Secondary | ICD-10-CM

## 2023-09-14 NOTE — Therapy (Signed)
OUTPATIENT PHYSICAL THERAPY NEURO TREATMENT   Patient Name: Adam [name] MRN: [id number] DOB:1951/07/05, 72 y.o., male Today's Date: 09/14/2023   PCP: Evelena Peat, MD REFERRING PROVIDER: Windell Norfolk, MD  END OF SESSION:  PT End of Session - 09/14/23 1410     Visit Number 6    Number of Visits 12    Date for PT Re-Evaluation 10/10/23    Authorization Type [employer] Medicare    Progress Note Due on Visit 10    PT Start Time 1400    PT Stop Time 1445    PT Time Calculation (min) 45 min             Past Medical History:  Diagnosis Date   Alcohol withdrawal (HCC) 10/24/2019   Allergy    Anxiety    history of PTSD following CABG   Ascending aortic aneurysm (HCC) 01/31/2018   43 x 42 mm, pt unaware   Asthma    Cardiomegaly 10/17/2017   Colitis- colonoscopy 2014 07/13/2015   COPD (chronic obstructive pulmonary disease) (HCC)    Coronary artery disease    x 6   Depression    Diabetes mellitus without complication (HCC)    Family history of polyps in the colon    Finger dislocation    Left pinkie   GERD (gastroesophageal reflux disease)    Gout    H/O atrial fibrillation without current medication    following CABG with no documented episodes since then.   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   Past Surgical History:  Procedure Laterality Date   ANKLE FRACTURE SURGERY Right 1991   APPENDECTOMY     ATRIAL FIBRILLATION ABLATION N/A 03/31/2018   Procedure: ATRIAL FIBRILLATION ABLATION;  Surgeon: Hillis Range, MD;  Location: [facility];  Service: Cardiovascular;  Laterality: N/A;   CARDIAC ELECTROPHYSIOLOGY MAPPING AND ABLATION     CARDIOVERSION N/A  03/23/2021   Procedure: CARDIOVERSION;  Surgeon: Elease Hashimoto Deloris Ping, MD;  Location: [facility] ENDOSCOPY;  Service: Cardiovascular;  Laterality: N/A;   COLONOSCOPY  06/11/2021   COLONOSCOPY W/ BIOPSIES  2017   x7   CORONARY ANGIOPLASTY WITH STENT PLACEMENT     CORONARY ARTERY BYPASS GRAFT     FINGER SURGERY  04/2018   Small finger left hand   implantable loop recorder placement  07/02/2019   Medtronic Reveal [place] model LNQ11 (SN [id number]) implanted in office by Dr Johney Frame   TEE WITHOUT CARDIOVERSION N/A 03/30/2018   Procedure: TRANSESOPHAGEAL ECHOCARDIOGRAM (TEE);  Surgeon: Thurmon Fair, MD;  Location: [facility] ENDOSCOPY;  Service: Cardiovascular;  Laterality: N/A;   TEE WITHOUT CARDIOVERSION N/A 03/23/2021   Procedure: TRANSESOPHAGEAL ECHOCARDIOGRAM (TEE);  Surgeon: Elease Hashimoto Deloris Ping, MD;  Location: [facility] ENDOSCOPY;  Service: Cardiovascular;  Laterality: N/A;   TOTAL HIP ARTHROPLASTY Left    TOTAL HIP ARTHROPLASTY Right 09/12/2018   Procedure: TOTAL HIP ARTHROPLASTY ANTERIOR APPROACH;  Surgeon: Durene Romans, MD;  Location: [facility] ORS;  Service: Orthopedics;  Laterality: Right;  70 mins   Patient Active Problem List   Diagnosis Date Noted   COPD with asthma (HCC) 03/02/2022   Wernicke encephalopathy 10/14/2021   Psoriasis 10/14/2021  Hypokalemia 07/30/2021   Hypertension 06/05/2020   Pressure injury of skin 02/21/2020   Rapid atrial fibrillation (HCC) 02/20/2020   High anion gap metabolic acidosis 02/08/2020   Hypercoagulable state due to persistent atrial fibrillation (HCC) 11/02/2019   Paroxysmal atrial fibrillation (HCC) 10/24/2019   Degeneration of lumbar intervertebral disc 09/27/2019   Thoracic aortic aneurysm (HCC) 02/08/2019   Hemoptysis 10/06/2018   S/P right THA, 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   S/P hip replacement 09/12/2018   Low back pain 05/19/2018   Sleep apnea 01/09/2018   Persistent atrial fibrillation 12/12/2017   Allergic rhinitis 08/22/2015   Family history of colon cancer in mother deceased  age 24 02-Aug-2015   Type 2 diabetes mellitus, controlled (HCC) 07/17/2013   COPD with acute exacerbation (HCC) 07/05/2013   CAD (coronary artery disease) 06/26/2013   Hypertension associated with diabetes (HCC) 06/26/2013   Hyperlipidemia associated with type 2 diabetes mellitus (HCC) 06/26/2013   GERD (gastroesophageal reflux disease) 06/26/2013   History of atrial fibrillation without current medication 06/26/2013   Gout 06/26/2013   Osteoarthritis 06/26/2013   Asthma, mild persistent 06/26/2013   Alcohol abuse 06/26/2013   PTSD (post-traumatic stress disorder) 06/26/2013   HLA B27 (HLA B27 positive) 06/26/2013   Obesity (BMI 30-39.9) 06/26/2013   Benign essential hypertension 06/26/2013    ONSET DATE: 10/14/21  REFERRING DIAG: R29.6 (ICD-10-CM) - Recurrent falls  THERAPY DIAG:  Unsteadiness on feet  Other abnormalities of gait and mobility  Muscle weakness (generalized)  Rationale for Evaluation and Treatment: Rehabilitation  SUBJECTIVE:                                                                                                                                                                                             SUBJECTIVE STATEMENT: No falls since last session.  Back is sore. Pt accompanied by: significant other [name]  PERTINENT HISTORY: Chronic alcohol, COPD, Heart disease, DM, Gout, Depression, HLD, bilat THR, hx of CABG. He is able to understand and communicate with writing and gesture, sometimes gets frustrated when wife does not understand him.  He is still having gait abnormality.  Even with the walker, he continued to fall.  He continues to drink alcohol daily, even though he decreased the amount of alcohol.  Wife tells me that he has lost a lot of weight almost 100 pounds due to the fact that he is choking, he cannot swallow, he drools all the time.  They tell me that they have not seen GI or have any new swallow study or endoscopy done. He is still  have sign of his Wernicke including pseudobulbar affect   PAIN:  Are you having pain? No, right shoulder is sore from recent fall  PRECAUTIONS: Fall  RED FLAGS: None   WEIGHT BEARING RESTRICTIONS: No  FALLS: Has patient fallen in last 6 months?  "Too many to count"  LIVING ENVIRONMENT: Lives with: lives with their spouse Lives in: House/apartment Stairs: No Has following equipment at home: Environmental consultant - 4 wheeled and Grab bars  PLOF: Independent with basic ADLs and Independent with household mobility with device  PATIENT GOALS: "walk without falling. Be able to walk the dog "Jessie" (medium sized dog). Be able to get up from ground after a fall.  OBJECTIVE:   TODAY'S TREATMENT: 09/14/23 Activity Comments  NU-step resistance intervals x 9 min MHP applied for back soreness during. Descending resistance intervals x 30 sec  balance Standing on firm/foam, feet apart/close withstanding postural perturbations and head turns. Tendency for right and retro-LOB Stepping stone taps stepping outside BOS and across midline  Sit to stand to stone tap 2x5--foot tap outside BOS/across mid line (22" seat height)  Rocker board x 2 min Anterior-post lateral  Retrowalk 2x40 ft CGA        TODAY'S TREATMENT: 09/12/23 Activity Comments  Fall recovery training -lowered to ground. Movement from prone to quadruped min-mod A. Half-kneeling to seat recovery min-mod A  HEP review 100% recall  Multisensory balance activities on foam -on compliant surfaces (unable to actively close eyes) -lateral/forward cone taps x 60 sec -forward squat-touch 6" height target on ground w/ unilat UE support 1x10  Large amplitude pivot turns   Resisted sit-stand 5x5 Blue t-band, elevated EOM  Retro-walk 1x45 ft CGA W/ rollator        PATIENT EDUCATION: Education details: assessment details, rationale of PT intervention, HEP initiation Person educated: Patient and Spouse Education method: Explanation and  Handouts Education comprehension: verbalized understanding  HOME EXERCISE PROGRAM: Access Code: [facility] URL: [email or web address] Date: 08/29/2023 Prepared by: Shary Decamp  Exercises - Standing Single Leg Stance with Counter Support  - 1 x daily - 7 x weekly - 3 sets - 10 sec hold - Side Stepping with Counter Support  - 1 x daily - 7 x weekly - 1-3 sets - 2 min hold - Mini Squat with Counter Support  - 1 x daily - 7 x weekly - 3 sets - 5 reps  Note: Objective measures were completed at Evaluation unless otherwise noted.  DIAGNOSTIC FINDINGS: N/A for this episode  COGNITION: Overall cognitive status: History of cognitive impairments - at baseline   SENSATION: Not tested, reports neuropathy in BLE  COORDINATION: Difficulty with rapid alternating movements Heel to shin impaired LLE>RLE Unable to close eyes to perform finger to nose  EDEMA:  None present  MUSCLE TONE: NT  MUSCLE LENGTH: WFL  DTRs:  NT  POSTURE: No Significant postural limitations  LOWER EXTREMITY ROM:     Active  Right Eval Left Eval  Hip flexion 110+ 110+  Hip extension    Hip abduction    Hip adduction    Hip internal rotation    Hip external rotation    Knee flexion 120 120  Knee extension 0 0  Ankle dorsiflexion 15 15  Ankle plantarflexion    Ankle inversion    Ankle eversion     (Blank rows = not tested)  LOWER EXTREMITY MMT:    MMT Right Eval Left Eval  Hip flexion 3+ 3+  Hip extension    Hip abduction 3+ 3+  Hip adduction 3+ 3+  Hip internal rotation  Hip external rotation    Knee flexion 5 5  Knee extension 4 4  Ankle dorsiflexion 4 4  Ankle plantarflexion    Ankle inversion    Ankle eversion    (Blank rows = not tested)  BED MOBILITY:  Independent  TRANSFERS: Assistive device utilized: Environmental consultant - 4 wheeled  Sit to stand: Modified independence and SBA Stand to sit: Modified independence Chair to chair: Modified independence and SBA Floor:  NT  but spouse reports usually requires male friend to assist when performing fall recovery from floor  RAMP:  Level of Assistance: SBA and CGA Assistive device utilized: Environmental consultant - 4 wheeled Ramp Comments:   CURB:  Level of Assistance: CGA Assistive device utilized: Environmental consultant - 4 wheeled Curb Comments:   STAIRS: NT, reports no stairs at home  GAIT: Gait pattern:  some instances of shuffling, RLE deficits from hx of orthopedic injuries/surgeries Distance walked:  Assistive device utilized: Environmental consultant - 4 wheeled Level of assistance: Modified independence and SBA Comments:   FUNCTIONAL TESTS:  5 times sit to stand: 1 rep Timed up and go (TUG): 16 sec w/ 4UJ Berg Balance Scale: 38/56 Romberg: mild-moderate sway eyes open, unable to volitionally close eyes. Therapist obstructs vision with paper which caused worse imbalance    GOALS: Goals reviewed with patient? Yes  SHORT TERM GOALS: Target date: 09/19/2023    Patient will be independent in HEP to improve functional outcomes Baseline: Goal status: IN PROGRESS  2.  Demo improved balance and BLE strength as evidenced by ability to perform 5xSTS test in 25 sec Baseline: able to complete 1 repetition Goal status: IN PROGRESS  3.  Demo improved postural control exhibiting mild sway with Romberg position to improve safety with ADL Baseline: moderate Goal status: IN PROGRESS    LONG TERM GOALS: Target date: 10/10/2023    Demo reduced risk for falls per score 49/56 Berg Balance Test Baseline: 38/56 Goal status: INITIAL  2.  Exhibit reduced risk for falls per time 12 sec TUG test Baseline: 16 sec w/ 8JX Goal status: INITIAL  3.  Demo improved BLE strength and reduced risk for falls per time of 15 sec 5xSTS test Baseline: 1 rep Goal status: INITIAL  4.  Demo modified independent floor to stand to improve capabilities for fall recovery Baseline: spouse reports dependence on physical assistance Goal status: INITIAL  5.  Demo  ability to safely walk dog x 10 min to increase activity participation Baseline:  Goal status: INITIAL    ASSESSMENT:  CLINICAL IMPRESSION: Reports low back soreness from recent fall. Initiated with NU-step for resistance intervals and MHP to back to addres soreness and improve activity tolerance and LE power.  Continued with static and dynamic balance challenges to enable righting reactions agains perturbations with tendency for retro and right LOB from demands with present but delayed righting reactions.  Training with emphasis on recruitment for ankle,hip and stepping strategies.  Transfer training with balance challenge to improve single limb stability and stepping outside BOS with tendency for LOB if crossing midline.  Continued sessions would be of benefit to progress strength and balance to reduce risk for falls and continue to address fall recovery techniques  OBJECTIVE IMPAIRMENTS: Abnormal gait, decreased activity tolerance, decreased balance, decreased coordination, decreased endurance, decreased mobility, difficulty walking, decreased strength, and decreased safety awareness.   ACTIVITY LIMITATIONS: carrying, lifting, bending, standing, squatting, transfers, reach over head, and locomotion level  PARTICIPATION LIMITATIONS: meal prep, cleaning, laundry, interpersonal relationship, shopping, community activity, and dog  walking  PERSONAL FACTORS: Age, Time since onset of injury/illness/exacerbation, and 3+ comorbidities: PMH  are also affecting patient's functional outcome.   REHAB POTENTIAL: Good  CLINICAL DECISION MAKING: Evolving/moderate complexity  EVALUATION COMPLEXITY: Moderate  PLAN:  PT FREQUENCY: 2x/week  PT DURATION: 6 weeks  PLANNED INTERVENTIONS: 97110-Therapeutic exercises, 97530- Therapeutic activity, 97112- Neuromuscular re-education, 97535- Self Care, 62952- Manual therapy, [phone]- Gait training, [phone]- Canalith repositioning, [phone]- Aquatic Therapy,  Patient/Family education, Balance training, Stair training, Taping, Dry Needling, Joint mobilization, Spinal mobilization, Vestibular training, DME instructions, and Wheelchair mobility training  PLAN FOR NEXT SESSION: HEP review , lunge to stand with furniture for LE strength/fall recovery, general strength/balance   2:11 PM, 09/14/23 M. Shary Decamp, PT, DPT Physical Therapist- [place] Office Number: [phone]

## 2023-09-15 ENCOUNTER — Ambulatory Visit: Payer: Medicare Other

## 2023-09-19 ENCOUNTER — Ambulatory Visit: Payer: Medicare Other

## 2023-09-19 DIAGNOSIS — M6281 Muscle weakness (generalized): Secondary | ICD-10-CM

## 2023-09-19 DIAGNOSIS — R2681 Unsteadiness on feet: Secondary | ICD-10-CM | POA: Diagnosis not present

## 2023-09-19 DIAGNOSIS — R2689 Other abnormalities of gait and mobility: Secondary | ICD-10-CM | POA: Diagnosis not present

## 2023-09-19 NOTE — Therapy (Signed)
OUTPATIENT PHYSICAL THERAPY NEURO TREATMENT   Patient Name: Adam [name] MRN: [id number] DOB:[birth year], 72 y.o., male Today's Date: 09/19/2023   PCP: Evelena Peat, MD REFERRING PROVIDER: Windell Norfolk, MD  END OF SESSION:  PT End of Session - 09/19/23 1410     Visit Number 7    Number of Visits 12    Date for PT Re-Evaluation 10/10/23    Authorization Type BCBS Medicare    Progress Note Due on Visit 10    PT Start Time 1400    PT Stop Time 1445    PT Time Calculation (min) 45 min             Past Medical History:  Diagnosis Date   Alcohol withdrawal (HCC) 10/24/2019   Allergy    Anxiety    history of PTSD following CABG   Ascending aortic aneurysm (HCC) 01/31/2018   43 x 42 mm, pt unaware   Asthma    Cardiomegaly 10/17/2017   Colitis- colonoscopy 2014 07/13/2015   COPD (chronic obstructive pulmonary disease) (HCC)    Coronary artery disease    x 6   Depression    Diabetes mellitus without complication (HCC)    Family history of polyps in the colon    Finger dislocation    Left pinkie   GERD (gastroesophageal reflux disease)    Gout    H/O atrial fibrillation without current medication    following CABG with no documented episodes since then.   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   Past Surgical History:  Procedure Laterality Date   ANKLE FRACTURE SURGERY Right 1991   APPENDECTOMY     ATRIAL FIBRILLATION ABLATION N/A 03/31/2018   Procedure: ATRIAL FIBRILLATION ABLATION;  Surgeon: Hillis Range, MD;  Location: [facility];  Service: Cardiovascular;  Laterality: N/A;   CARDIAC ELECTROPHYSIOLOGY MAPPING AND ABLATION     CARDIOVERSION N/A  03/23/2021   Procedure: CARDIOVERSION;  Surgeon: Elease Hashimoto Deloris Ping, MD;  Location: [facility] ENDOSCOPY;  Service: Cardiovascular;  Laterality: N/A;   COLONOSCOPY  06/11/2021   COLONOSCOPY W/ BIOPSIES  2017   x7   CORONARY ANGIOPLASTY WITH STENT PLACEMENT     CORONARY ARTERY BYPASS GRAFT     FINGER SURGERY  04/2018   Small finger left hand   implantable loop recorder placement  07/02/2019   Medtronic Reveal [place] model LNQ11 (SN [id number]) implanted in office by Dr Johney Frame   TEE WITHOUT CARDIOVERSION N/A 03/30/2018   Procedure: TRANSESOPHAGEAL ECHOCARDIOGRAM (TEE);  Surgeon: Thurmon Fair, MD;  Location: [facility] ENDOSCOPY;  Service: Cardiovascular;  Laterality: N/A;   TEE WITHOUT CARDIOVERSION N/A 03/23/2021   Procedure: TRANSESOPHAGEAL ECHOCARDIOGRAM (TEE);  Surgeon: Elease Hashimoto Deloris Ping, MD;  Location: [facility] ENDOSCOPY;  Service: Cardiovascular;  Laterality: N/A;   TOTAL HIP ARTHROPLASTY Left    TOTAL HIP ARTHROPLASTY Right 09/12/2018   Procedure: TOTAL HIP ARTHROPLASTY ANTERIOR APPROACH;  Surgeon: Durene Romans, MD;  Location: [facility] OR[facility];  Service: Orthopedics;  Laterality: Right;  70 mins   Patient Active Problem List   Diagnosis Date Noted   COPD with asthma (HCC) 03/02/2022   Wernicke encephalopathy 10/14/2021   Psoriasis 10/14/2021  Hypokalemia 07/30/2021   Hypertension 06/05/2020   Pressure injury of skin 02/21/2020   Rapid atrial fibrillation (HCC) 02/20/2020   High anion gap metabolic acidosis 02/08/2020   Hypercoagulable state due to persistent atrial fibrillation (HCC) 11/02/2019   Paroxysmal atrial fibrillation (HCC) 10/24/2019   Degeneration of lumbar intervertebral disc 09/27/2019   Thoracic aortic aneurysm (HCC) 02/08/2019   Hemoptysis 10/06/2018   S/P right THA, 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   S/P hip replacement 09/12/2018   Low back pain 05/19/2018   Sleep apnea 01/09/2018   Persistent atrial fibrillation 12/12/2017   Allergic rhinitis 08/22/2015   Family history of colon cancer in mother deceased  age 50 July 17, 2015   Type 2 diabetes mellitus, controlled (HCC) 07/17/2013   COPD with acute exacerbation (HCC) 07/05/2013   CAD (coronary artery disease) 06/26/2013   Hypertension associated with diabetes (HCC) 06/26/2013   Hyperlipidemia associated with type 2 diabetes mellitus (HCC) 06/26/2013   GERD (gastroesophageal reflux disease) 06/26/2013   History of atrial fibrillation without current medication 06/26/2013   Gout 06/26/2013   Osteoarthritis 06/26/2013   Asthma, mild persistent 06/26/2013   Alcohol abuse 06/26/2013   PTSD (post-traumatic stress disorder) 06/26/2013   HLA B27 (HLA B27 positive) 06/26/2013   Obesity (BMI 30-39.9) 06/26/2013   Benign essential hypertension 06/26/2013    ONSET DATE: 10/14/21  REFERRING DIAG: R29.6 (ICD-10-CM) - Recurrent falls  THERAPY DIAG:  Unsteadiness on feet  Other abnormalities of gait and mobility  Muscle weakness (generalized)  Rationale for Evaluation and Treatment: Rehabilitation  SUBJECTIVE:                                                                                                                                                                                             SUBJECTIVE STATEMENT: Rolled out of bed the the other day and struck left forehead on night stand.  Notes feeling dizzy/lightheaded when arising from bed. No witnessed falls to the ground over weekend Pt accompanied by: significant other [name]  PERTINENT HISTORY: Chronic alcohol, COPD, Heart disease, DM, Gout, Depression, HLD, bilat THR, hx of CABG. He is able to understand and communicate with writing and gesture, sometimes gets frustrated when wife does not understand him.  He is still having gait abnormality.  Even with the walker, he continued to fall.  He continues to drink alcohol daily, even though he decreased the amount of alcohol.  Wife tells me that he has lost a lot of weight almost 100 pounds due to the fact that he is choking, he cannot  swallow, he drools all the time.  They tell me that they have not seen GI or  have any new swallow study or endoscopy done. He is still have sign of his Wernicke including pseudobulbar affect   PAIN:  Are you having pain? No, right shoulder is sore from recent fall  PRECAUTIONS: Fall  RED FLAGS: None   WEIGHT BEARING RESTRICTIONS: No  FALLS: Has patient fallen in last 6 months?  "Too many to count"  LIVING ENVIRONMENT: Lives with: lives with their spouse Lives in: House/apartment Stairs: No Has following equipment at home: Environmental consultant - 4 wheeled and Grab bars  PLOF: Independent with basic ADLs and Independent with household mobility with device  PATIENT GOALS: "walk without falling. Be able to walk the dog "Jessie" (medium sized dog). Be able to get up from ground after a fall.  OBJECTIVE:   TODAY'S TREATMENT: 09/19/23 Activity Comments  NU-step level 5 x 8 min For general conditioning   R Dix-Hallpike Brief upbeating nystagmus  R Epley   R Dix-Hallpike No nystagmus, no report of dizziness  5xSTS test 20 sec w/ UE push-off and for lowering  Romberg Mild sway condition 1 and with vision obstructed  Retrowalk and sidestep 4x25 ft CGA-min A, one LOB w/ retro     TODAY'S TREATMENT: 09/14/23 Activity Comments  NU-step resistance intervals x 9 min MHP applied for back soreness during. Descending resistance intervals x 30 sec  balance Standing on firm/foam, feet apart/close withstanding postural perturbations and head turns. Tendency for right and retro-LOB Stepping stone taps stepping outside BOS and across midline  Sit to stand to stone tap 2x5--foot tap outside BOS/across mid line (22" seat height)  Rocker board x 2 min Anterior-post lateral  Retrowalk 2x40 ft CGA             PATIENT EDUCATION: Education details: assessment details, rationale of PT intervention, HEP initiation Person educated: Patient and Spouse Education method: Explanation and Handouts Education  comprehension: verbalized understanding  HOME EXERCISE PROGRAM: Access Code: [facility] URL: [email or web address] Date: 08/29/2023 Prepared by: Shary Decamp  Exercises - Standing Single Leg Stance with Counter Support  - 1 x daily - 7 x weekly - 3 sets - 10 sec hold - Side Stepping with Counter Support  - 1 x daily - 7 x weekly - 1-3 sets - 2 min hold - Mini Squat with Counter Support  - 1 x daily - 7 x weekly - 3 sets - 5 reps  Note: Objective measures were completed at Evaluation unless otherwise noted.  DIAGNOSTIC FINDINGS: N/A for this episode  COGNITION: Overall cognitive status: History of cognitive impairments - at baseline   SENSATION: Not tested, reports neuropathy in BLE  COORDINATION: Difficulty with rapid alternating movements Heel to shin impaired LLE>RLE Unable to close eyes to perform finger to nose  EDEMA:  None present  MUSCLE TONE: NT  MUSCLE LENGTH: WFL  DTRs:  NT  POSTURE: No Significant postural limitations  LOWER EXTREMITY ROM:     Active  Right Eval Left Eval  Hip flexion 110+ 110+  Hip extension    Hip abduction    Hip adduction    Hip internal rotation    Hip external rotation    Knee flexion 120 120  Knee extension 0 0  Ankle dorsiflexion 15 15  Ankle plantarflexion    Ankle inversion    Ankle eversion     (Blank rows = not tested)  LOWER EXTREMITY MMT:    MMT Right Eval Left Eval  Hip flexion 3+ 3+  Hip extension    Hip abduction 3+ 3+  Hip adduction 3+ 3+  Hip internal rotation    Hip external rotation    Knee flexion 5 5  Knee extension 4 4  Ankle dorsiflexion 4 4  Ankle plantarflexion    Ankle inversion    Ankle eversion    (Blank rows = not tested)  BED MOBILITY:  Independent  TRANSFERS: Assistive device utilized: Environmental consultant - 4 wheeled  Sit to stand: Modified independence and SBA Stand to sit: Modified independence Chair to chair: Modified independence and SBA Floor:  NT but spouse reports  usually requires male friend to assist when performing fall recovery from floor  RAMP:  Level of Assistance: SBA and CGA Assistive device utilized: Environmental consultant - 4 wheeled Ramp Comments:   CURB:  Level of Assistance: CGA Assistive device utilized: Environmental consultant - 4 wheeled Curb Comments:   STAIRS: NT, reports no stairs at home  GAIT: Gait pattern:  some instances of shuffling, RLE deficits from hx of orthopedic injuries/surgeries Distance walked:  Assistive device utilized: Environmental consultant - 4 wheeled Level of assistance: Modified independence and SBA Comments:   FUNCTIONAL TESTS:  5 times sit to stand: 1 rep Timed up and go (TUG): 16 sec w/ 5HQ Berg Balance Scale: 38/56 Romberg: mild-moderate sway eyes open, unable to volitionally close eyes. Therapist obstructs vision with paper which caused worse imbalance    GOALS: Goals reviewed with patient? Yes  SHORT TERM GOALS: Target date: 09/19/2023    Patient will be independent in HEP to improve functional outcomes Baseline: Goal status: MET  2.  Demo improved balance and BLE strength as evidenced by ability to perform 5xSTS test in 25 sec Baseline: able to complete 1 repetition; (09/19/23) 20 sec w/ BUE support Goal status: NOT MET  3.  Demo improved postural control exhibiting mild sway with Romberg position to improve safety with ADL Baseline: moderate; (09/19/23) mild sway condition 1 and 2  Goal status: MET    LONG TERM GOALS: Target date: 10/10/2023    Demo reduced risk for falls per score 49/56 Berg Balance Test Baseline: 38/56 Goal status: INITIAL  2.  Exhibit reduced risk for falls per time 12 sec TUG test Baseline: 16 sec w/ 4ON Goal status: INITIAL  3.  Demo improved BLE strength and reduced risk for falls per time of 15 sec 5xSTS test Baseline: 1 rep Goal status: INITIAL  4.  Demo modified independent floor to stand to improve capabilities for fall recovery Baseline: spouse reports dependence on physical  assistance Goal status: INITIAL  5.  Demo ability to safely walk dog x 10 min to increase activity participation Baseline:  Goal status: INITIAL    ASSESSMENT:  CLINICAL IMPRESSION: Reports recent fall from bed striking forehead on night stand. Reports feeling dizzy with position changes. Positional testing reveals + Right Dix-Hallpike with brief right upbeating nystagmus treated with Epley maneuver and no nystagmus to follow-up Dix-Hallpike.  Review of STG with ability to perfrom 5xSTS requiring UE support for concentric/eccentric but improved from previous 1-rep performance.  Romberg position reveals improved stability with eyes open and vision obstructed (unable to volitionally close eyes).  Dynamic balance to end session with retro walk and side step to improve narrow BOS and single limb support with one episode of LOB requiring physical assist to correct. Continued sessions to progress POC details and reduce risk for falls.  OBJECTIVE IMPAIRMENTS: Abnormal gait, decreased activity tolerance, decreased balance, decreased coordination, decreased endurance, decreased mobility, difficulty walking, decreased strength, and decreased safety awareness.   ACTIVITY LIMITATIONS: carrying,  lifting, bending, standing, squatting, transfers, reach over head, and locomotion level  PARTICIPATION LIMITATIONS: meal prep, cleaning, laundry, interpersonal relationship, shopping, community activity, and dog walking  PERSONAL FACTORS: Age, Time since onset of injury/illness/exacerbation, and 3+ comorbidities: PMH  are also affecting patient's functional outcome.   REHAB POTENTIAL: Good  CLINICAL DECISION MAKING: Evolving/moderate complexity  EVALUATION COMPLEXITY: Moderate  PLAN:  PT FREQUENCY: 2x/week  PT DURATION: 6 weeks  PLANNED INTERVENTIONS: 97110-Therapeutic exercises, 97530- Therapeutic activity, 97112- Neuromuscular re-education, 97535- Self Care, 01601- Manual therapy, [phone]- Gait training,  [phone]- Canalith repositioning, [phone]- Aquatic Therapy, Patient/Family education, Balance training, Stair training, Taping, Dry Needling, Joint mobilization, Spinal mobilization, Vestibular training, DME instructions, and Wheelchair mobility training  PLAN FOR NEXT SESSION: HEP review , lunge to stand with furniture for LE strength/fall recovery, general strength/balance   2:10 PM, 09/19/23 M. Shary Decamp, PT, DPT Physical Therapist- [place] Office Number: [phone]

## 2023-09-20 ENCOUNTER — Ambulatory Visit (INDEPENDENT_AMBULATORY_CARE_PROVIDER_SITE_OTHER): Payer: Medicare Other | Admitting: Family Medicine

## 2023-09-20 ENCOUNTER — Encounter: Payer: Self-pay | Admitting: Family Medicine

## 2023-09-20 VITALS — BP 124/70 | HR 57 | Ht 70.0 in | Wt 170.6 lb

## 2023-09-20 DIAGNOSIS — G1229 Other motor neuron disease: Secondary | ICD-10-CM | POA: Insufficient documentation

## 2023-09-20 DIAGNOSIS — R296 Repeated falls: Secondary | ICD-10-CM | POA: Insufficient documentation

## 2023-09-20 DIAGNOSIS — R4701 Aphasia: Secondary | ICD-10-CM | POA: Diagnosis not present

## 2023-09-20 NOTE — Progress Notes (Signed)
Established Patient Office Visit  Subjective   Patient ID: Adam [name], male    DOB: 09/24/1951  Age: 72 y.o. MRN: [id number]  Chief Complaint  Patient presents with   Medical Management of Chronic Issues    HPI   Adam [name] is here accompanied by wife to discuss recent medical issues.  Patient unable to communicate verbally but does so through writing.  He saw neurology recently and follow-up and they attempted swallowing study with speech and language pathology.  He has history of Wernicke encephalopathy, alcohol abuse, GERD, obstructive sleep apnea, restless leg syndrome, COPD, depression, CAD, gout.  Patient was basically unable to perform modified barium swallow and test was aborted.  He has lost almost 100 pounds over the past year and a half.  Most recent albumin 3.5.  There was discussion of G-tube and he is adamant that he does not wish to proceed with that.  He states he is currently ingesting things by laying flat on the bed and swallowing pills with yogurt.  Has managed to do some things like Ensure.  Eating soft foods such as sauted spinach.  He is still ingesting alcohol.  Very high risk for aspiration secondary to weakness, poor cough reflex, and his neurologic disorder.  He was placed recently on Nuedexta twice daily per neurology and he feels like this has helped his "emotions ".  He is currently getting physical therapy 2 times per week to try to work on strengthening.  Currently is able to swallow all his tablets.  He does have type 2 diabetes but currently well-controlled with recent A1c 5.9%.  His control obviously improved with significant weight loss.  Past Medical History:  Diagnosis Date   Alcohol withdrawal (HCC) 10/24/2019   Allergy    Anxiety    history of PTSD following CABG   Ascending aortic aneurysm (HCC) 01/31/2018   43 x 42 mm, pt unaware   Asthma    Cardiomegaly 10/17/2017   Colitis- colonoscopy 2014 07/13/2015   COPD (chronic obstructive pulmonary  disease) (HCC)    Coronary artery disease    x 6   Depression    Diabetes mellitus without complication (HCC)    Family history of polyps in the colon    Finger dislocation    Left pinkie   GERD (gastroesophageal reflux disease)    Gout    H/O atrial fibrillation without current medication    following CABG with no documented episodes since then.   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   Past Surgical History:  Procedure Laterality Date   ANKLE FRACTURE SURGERY Right 1991   APPENDECTOMY     ATRIAL FIBRILLATION ABLATION N/A 03/31/2018   Procedure: ATRIAL FIBRILLATION ABLATION;  Surgeon: Hillis Range, MD;  Location: [facility]AS[facility] LAB;  Service: Cardiovascular;  Laterality: N/A;   CARDIAC ELECTROPHYSIOLOGY MAPPING AND ABLATION     CARDIOVERSION N/A 03/23/2021   Procedure: CARDIOVERSION;  Surgeon: Elease Hashimoto Deloris Ping, MD;  Location: [facility] ENDOSCOPY;  Service: Cardiovascular;  Laterality: N/A;   COLONOSCOPY  06/11/2021   COLONOSCOPY W/ BIOPSIES  2017   x7   CORONARY ANGIOPLASTY WITH STENT PLACEMENT  CORONARY ARTERY BYPASS GRAFT     FINGER SURGERY  04/2018   Small finger left hand   implantable loop recorder placement  07/02/2019   Medtronic Reveal [place] model LNQ11 (SN [id number]) implanted in office by Dr Johney Frame   TEE WITHOUT CARDIOVERSION N/A 03/30/2018   Procedure: TRANSESOPHAGEAL ECHOCARDIOGRAM (TEE);  Surgeon: Thurmon Fair, MD;  Location: [facility] ENDOSCOPY;  Service: Cardiovascular;  Laterality: N/A;   TEE WITHOUT CARDIOVERSION N/A 03/23/2021   Procedure: TRANSESOPHAGEAL ECHOCARDIOGRAM (TEE);  Surgeon: Elease Hashimoto Deloris Ping, MD;  Location: [facility] ENDOSCOPY;  Service: Cardiovascular;   Laterality: N/A;   TOTAL HIP ARTHROPLASTY Left    TOTAL HIP ARTHROPLASTY Right 09/12/2018   Procedure: TOTAL HIP ARTHROPLASTY ANTERIOR APPROACH;  Surgeon: Durene Romans, MD;  Location: [facility] ORS;  Service: Orthopedics;  Laterality: Right;  70 mins    reports that he quit smoking about 45 years ago. His smoking use included cigarettes. He started smoking about 52 years ago. He has a 7 pack-year smoking history. He has never used smokeless tobacco. He reports current alcohol use of about 3.0 - 4.0 standard drinks of alcohol per week. He reports that he does not use drugs. family history includes Cancer in his father; Colon cancer in his mother; Heart disease in his paternal grandfather and paternal grandmother. Allergies  Allergen Reactions   Amoxicillin Rash and Other (See Comments)    RASH IN GROIN AREA Has patient had a PCN reaction causing immediate rash, facial/tongue/throat swelling, SOB or lightheadedness with hypotension: no Has patient had a PCN reaction causing severe rash involving mucus membranes or skin necrosis: no Has patient had a PCN reaction that required hospitalization: no Has patient had a PCN reaction occurring within the last 10 years: yes If all of the above answers are "NO", then may proceed with Cephalosporin use.    Augmentin [Amoxicillin-Pot Clavulanate] Rash and Other (See Comments)    RASH IN GROIN AREA   Azithromycin Rash and Other (See Comments)    RASH IN GROIN AREA   Clindamycin/Lincomycin Rash   Keflex [Cephalexin] Rash   Xarelto [Rivaroxaban] Other (See Comments)    Patient stated he "ended up in the hospital with a rectal bleed"    Review of Systems  Constitutional:  Positive for weight loss. Negative for chills and fever.  Respiratory:  Negative for cough and shortness of breath.   Cardiovascular:  Negative for chest pain.  Gastrointestinal:  Negative for abdominal pain.  Genitourinary:  Negative for dysuria.      Objective:     BP 124/70 (BP  Location: Left Arm, Patient Position: Sitting, Cuff Size: Normal)   Pulse (!) 57   Ht 5\' 10"  (1.778 m)   Wt 170 lb 9.6 oz (77.4 kg)   SpO2 98%   BMI 24.48 kg/m  BP Readings from Last 3 Encounters:  09/20/23 124/70  08/26/23 120/70  08/17/23 129/78   Wt Readings from Last 3 Encounters:  09/20/23 170 lb 9.6 oz (77.4 kg)  08/26/23 169 lb 12.8 oz (77 kg)  08/17/23 170 lb 8 oz (77.3 kg)      Physical Exam Vitals reviewed.  Constitutional:      General: He is not in acute distress.    Appearance: He is not ill-appearing.  Cardiovascular:     Rate and Rhythm: Normal rate and regular rhythm.  Pulmonary:     Effort: Pulmonary effort is normal.     Breath sounds: No wheezing or rales.     Comments: Somewhat diminished breath sounds throughout  Musculoskeletal:     Right lower leg: No edema.     Left lower leg: No edema.  Neurological:     Mental Status: He is alert.      No results found for any visits on 09/20/23.  Last CBC Lab Results  Component Value Date   WBC 6.4 08/26/2023   HGB 12.9 (L) 08/26/2023   HCT 39.8 08/26/2023   MCV 97.2 08/26/2023   MCH 34.3 (H) 11/05/2022   RDW 16.7 (H) 08/26/2023   PLT 211.0 08/26/2023   Last metabolic panel Lab Results  Component Value Date   GLUCOSE 118 (H) 08/26/2023   NA 141 08/26/2023   K 3.6 08/26/2023   CL 104 08/26/2023   CO2 31 08/26/2023   BUN 17 08/26/2023   CREATININE 1.28 08/26/2023   GFR 55.65 (L) 08/26/2023   CALCIUM 10.0 08/26/2023   PHOS 3.2 03/20/2021   PROT 6.6 07/28/2023   ALBUMIN 3.5 07/28/2023   LABGLOB 2.7 06/11/2022   AGRATIO 1.6 06/11/2022   BILITOT 1.2 07/28/2023   ALKPHOS 48 07/28/2023   AST 28 07/28/2023   ALT 20 07/28/2023   ANIONGAP 9 07/28/2023   Last lipids Lab Results  Component Value Date   CHOL 92 01/25/2023   HDL 38.50 (L) 01/25/2023   LDLCALC 31 01/25/2023   LDLDIRECT 124.0 06/23/2018   TRIG 115.0 01/25/2023   CHOLHDL 2 01/25/2023   Last hemoglobin A1c Lab Results   Component Value Date   HGBA1C 5.9 08/26/2023      The ASCVD Risk score (Arnett DK, et al., 2019) failed to calculate for the following reasons:   The valid total cholesterol range is 130 to 320 mg/dL    Assessment & Plan:   #1 progressive neurologic disorder with pseudobulbar palsy, aphasia, Wernicke encephalopathy with history of ongoing alcohol use.  Tremendous difficulty swallowing and had recent attempted barium swallow study which had to be aborted.  Multiple providers have had discussion regarding possible G-tube including neurologist and speech-language pathologist but patient is adamant that he would not wish to pursue that.  He is aware of his incredibly high risk of aspiration pneumonia.  We do feel like he is cognitively capable of making his own decisions at this time.  We did discuss goals of care and some depth.  They were not aware of palliative care we have given him a handout to consider pursuing that at some level.  They will consider and get back with me.  Even with soft foods like yogurt he has substantial risk for aspiration.  They are aware that even with G-tube this would not remove risk of aspiration pneumonias.  They will let me know if they reconsider G-tube  #2 type 2 diabetes which basically has improved significantly with significant weight loss.  Currently on Synjardy.  Talked about possibly simplifying at some point getting off the metformin.  Will reassess A1c at 7-month follow-up and discuss further then  #3 protein calorie malnutrition with weight loss of almost 100 pounds over the past year and a half.  Most recent albumin 3.5.  Discussed ways to try to supplement calories and protein  Evelena Peat, MD

## 2023-09-21 ENCOUNTER — Ambulatory Visit: Payer: Medicare Other

## 2023-09-21 DIAGNOSIS — M6281 Muscle weakness (generalized): Secondary | ICD-10-CM

## 2023-09-21 DIAGNOSIS — R2689 Other abnormalities of gait and mobility: Secondary | ICD-10-CM

## 2023-09-21 DIAGNOSIS — R2681 Unsteadiness on feet: Secondary | ICD-10-CM

## 2023-09-21 NOTE — Therapy (Signed)
OUTPATIENT PHYSICAL THERAPY NEURO TREATMENT   Patient Name: Adam [name] MRN: [id number] DOB:1951/04/21, 72 y.o., male Today's Date: 09/21/2023   PCP: Evelena Peat, MD REFERRING PROVIDER: Windell Norfolk, MD  END OF SESSION:  PT End of Session - 09/21/23 1532     Visit Number 8    Number of Visits 12    Date for PT Re-Evaluation 10/10/23    Authorization Type BCBS Medicare    Progress Note Due on Visit 10    PT Start Time 1530    PT Stop Time 1615    PT Time Calculation (min) 45 min             Past Medical History:  Diagnosis Date   Alcohol withdrawal (HCC) 10/24/2019   Allergy    Anxiety    history of PTSD following CABG   Ascending aortic aneurysm (HCC) 01/31/2018   43 x 42 mm, pt unaware   Asthma    Cardiomegaly 10/17/2017   Colitis- colonoscopy 2014 07/13/2015   COPD (chronic obstructive pulmonary disease) (HCC)    Coronary artery disease    x 6   Depression    Diabetes mellitus without complication (HCC)    Family history of polyps in the colon    Finger dislocation    Left pinkie   GERD (gastroesophageal reflux disease)    Gout    H/O atrial fibrillation without current medication    following CABG with no documented episodes since then.   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   Past Surgical History:  Procedure Laterality Date   ANKLE FRACTURE SURGERY Right 1991   APPENDECTOMY     ATRIAL FIBRILLATION ABLATION N/A 03/31/2018   Procedure: ATRIAL FIBRILLATION ABLATION;  Surgeon: Hillis Range, MD;  Location: [facility];  Service: Cardiovascular;  Laterality: N/A;   CARDIAC ELECTROPHYSIOLOGY MAPPING AND ABLATION     CARDIOVERSION N/A  03/23/2021   Procedure: CARDIOVERSION;  Surgeon: Elease Hashimoto Deloris Ping, MD;  Location: [facility] ENDOSCOPY;  Service: Cardiovascular;  Laterality: N/A;   COLONOSCOPY  06/11/2021   COLONOSCOPY W/ BIOPSIES  2017   x7   CORONARY ANGIOPLASTY WITH STENT PLACEMENT     CORONARY ARTERY BYPASS GRAFT     FINGER SURGERY  04/2018   Small finger left hand   implantable loop recorder placement  07/02/2019   [employer] Reveal [place] model LNQ11 (SN [id number]) implanted in office by Dr Johney Frame   TEE WITHOUT CARDIOVERSION N/A 03/30/2018   Procedure: TRANSESOPHAGEAL ECHOCARDIOGRAM (TEE);  Surgeon: Thurmon Fair, MD;  Location: [facility] ENDOSCOPY;  Service: Cardiovascular;  Laterality: N/A;   TEE WITHOUT CARDIOVERSION N/A 03/23/2021   Procedure: TRANSESOPHAGEAL ECHOCARDIOGRAM (TEE);  Surgeon: Elease Hashimoto Deloris Ping, MD;  Location: [facility] ENDOSCOPY;  Service: Cardiovascular;  Laterality: N/A;   TOTAL HIP ARTHROPLASTY Left    TOTAL HIP ARTHROPLASTY Right 09/12/2018   Procedure: TOTAL HIP ARTHROPLASTY ANTERIOR APPROACH;  Surgeon: Durene Romans, MD;  Location: [facility] ORS;  Service: Orthopedics;  Laterality: Right;  70 mins   Patient Active Problem List   Diagnosis Date Noted   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   Aphasia 09/20/2023   Recurrent falls 09/20/2023  COPD with asthma (HCC) 03/02/2022   Wernicke encephalopathy 10/14/2021   Psoriasis 10/14/2021   Hypokalemia 07/30/2021   Hypertension 06/05/2020   Protein-calorie malnutrition (HCC) 03/01/2020   Pressure injury of skin 02/21/2020   Rapid atrial fibrillation (HCC) 02/20/2020   High anion gap metabolic acidosis 02/08/2020   Hypercoagulable state due to persistent atrial fibrillation (HCC) 11/02/2019   Paroxysmal atrial fibrillation (HCC) 10/24/2019   Degeneration of lumbar intervertebral disc 09/27/2019   Thoracic aortic aneurysm (HCC) 02/08/2019   Hemoptysis 10/06/2018   S/P right THA, 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   S/P hip replacement 09/12/2018   Low back pain 05/19/2018   Sleep apnea  01/09/2018   Persistent atrial fibrillation 12/12/2017   Allergic rhinitis 08/22/2015   Family history of colon cancer in mother deceased age 64 08-12-15   Type 2 diabetes mellitus, controlled (HCC) 07/17/2013   COPD with acute exacerbation (HCC) 07/05/2013   CAD (coronary artery disease) 06/26/2013   Hypertension associated with diabetes (HCC) 06/26/2013   Hyperlipidemia associated with type 2 diabetes mellitus (HCC) 06/26/2013   GERD (gastroesophageal reflux disease) 06/26/2013   History of atrial fibrillation without current medication 06/26/2013   Gout 06/26/2013   Osteoarthritis 06/26/2013   Asthma, mild persistent 06/26/2013   Alcohol abuse 06/26/2013   PTSD (post-traumatic stress disorder) 06/26/2013   HLA B27 (HLA B27 positive) 06/26/2013   Obesity (BMI 30-39.9) 06/26/2013   Benign essential hypertension 06/26/2013    ONSET DATE: 10/14/21  REFERRING DIAG: R29.6 (ICD-10-CM) - Recurrent falls  THERAPY DIAG:  Unsteadiness on feet  Other abnormalities of gait and mobility  Muscle weakness (generalized)  Rationale for Evaluation and Treatment: Rehabilitation  SUBJECTIVE:                                                                                                                                                                                             SUBJECTIVE STATEMENT: No dizziness since last session with maneuver, no falls since Pt accompanied by: significant other [name]  PERTINENT HISTORY: Chronic alcohol, COPD, Heart disease, DM, Gout, Depression, HLD, bilat THR, hx of CABG. He is able to understand and communicate with writing and gesture, sometimes gets frustrated when wife does not understand him.  He is still having gait abnormality.  Even with the walker, he continued to fall.  He continues to drink alcohol daily, even though he decreased the amount of alcohol.  Wife tells me that he has lost a lot of weight almost 100 pounds due to the fact that he  is choking, he cannot swallow, he drools all the time.  They tell me that they have not seen GI  or have any new swallow study or endoscopy done. He is still have sign of his Wernicke including pseudobulbar affect   PAIN:  Are you having pain? No, right shoulder is sore from recent fall  PRECAUTIONS: Fall  RED FLAGS: None   WEIGHT BEARING RESTRICTIONS: No  FALLS: Has patient fallen in last 6 months?  "Too many to count"  LIVING ENVIRONMENT: Lives with: lives with their spouse Lives in: House/apartment Stairs: No Has following equipment at home: Environmental consultant - 4 wheeled and Grab bars  PLOF: Independent with basic ADLs and Independent with household mobility with device  PATIENT GOALS: "walk without falling. Be able to walk the dog "Jessie" (medium sized dog). Be able to get up from ground after a fall.  OBJECTIVE:   TODAY'S TREATMENT: 09/21/23 Activity Comments  Split stance unilat UE rrow 2x10 15#--pulling open heavy door  Seated row 3x10 -30# w/ trunk flexion to extension  Seated hamstring rows 3x10 15#  Resisted walking x 2 min Backwards-forwards 20# Forwards-backwards 15#  Push-release Ant, lateral, post   Sit-stand 5x5 Arm chair, angled back to replicate recliner     TODAY'S TREATMENT: 09/19/23 Activity Comments  NU-step level 5 x 8 min For general conditioning   R Dix-Hallpike Brief upbeating nystagmus  R Epley   R Dix-Hallpike No nystagmus, no report of dizziness  5xSTS test 20 sec w/ UE push-off and for lowering  Romberg Mild sway condition 1 and with vision obstructed  Retrowalk and sidestep 4x25 ft CGA-min A, one LOB w/ retro            PATIENT EDUCATION: Education details: assessment details, rationale of PT intervention, HEP initiation Person educated: Patient and Spouse Education method: Explanation and Handouts Education comprehension: verbalized understanding  HOME EXERCISE PROGRAM: Access Code: [facility] URL:  [email or web address] Date: 08/29/2023 Prepared by: Shary Decamp  Exercises - Standing Single Leg Stance with Counter Support  - 1 x daily - 7 x weekly - 3 sets - 10 sec hold - Side Stepping with Counter Support  - 1 x daily - 7 x weekly - 1-3 sets - 2 min hold - Mini Squat with Counter Support  - 1 x daily - 7 x weekly - 3 sets - 5 reps  Note: Objective measures were completed at Evaluation unless otherwise noted.  DIAGNOSTIC FINDINGS: N/A for this episode  COGNITION: Overall cognitive status: History of cognitive impairments - at baseline   SENSATION: Not tested, reports neuropathy in BLE  COORDINATION: Difficulty with rapid alternating movements Heel to shin impaired LLE>RLE Unable to close eyes to perform finger to nose  EDEMA:  None present  MUSCLE TONE: NT  MUSCLE LENGTH: WFL  DTRs:  NT  POSTURE: No Significant postural limitations  LOWER EXTREMITY ROM:     Active  Right Eval Left Eval  Hip flexion 110+ 110+  Hip extension    Hip abduction    Hip adduction    Hip internal rotation    Hip external rotation    Knee flexion 120 120  Knee extension 0 0  Ankle dorsiflexion 15 15  Ankle plantarflexion    Ankle inversion    Ankle eversion     (Blank rows = not tested)  LOWER EXTREMITY MMT:    MMT Right Eval Left Eval  Hip flexion 3+ 3+  Hip extension    Hip abduction 3+ 3+  Hip adduction 3+ 3+  Hip internal rotation    Hip external rotation    Knee flexion 5 5  Knee extension 4 4  Ankle dorsiflexion 4 4  Ankle plantarflexion    Ankle inversion    Ankle eversion    (Blank rows = not tested)  BED MOBILITY:  Independent  TRANSFERS: Assistive device utilized: Environmental consultant - 4 wheeled  Sit to stand: Modified independence and SBA Stand to sit: Modified independence Chair to chair: Modified independence and SBA Floor:  NT but spouse reports usually requires male friend to assist when performing fall recovery from floor  RAMP:   Level of Assistance: SBA and CGA Assistive device utilized: Environmental consultant - 4 wheeled Ramp Comments:   CURB:  Level of Assistance: CGA Assistive device utilized: Environmental consultant - 4 wheeled Curb Comments:   STAIRS: NT, reports no stairs at home  GAIT: Gait pattern:  some instances of shuffling, RLE deficits from hx of orthopedic injuries/surgeries Distance walked:  Assistive device utilized: Environmental consultant - 4 wheeled Level of assistance: Modified independence and SBA Comments:   FUNCTIONAL TESTS:  5 times sit to stand: 1 rep Timed up and go (TUG): 16 sec w/ 2LN Berg Balance Scale: 38/56 Romberg: mild-moderate sway eyes open, unable to volitionally close eyes. Therapist obstructs vision with paper which caused worse imbalance    GOALS: Goals reviewed with patient? Yes  SHORT TERM GOALS: Target date: 09/19/2023    Patient will be independent in HEP to improve functional outcomes Baseline: Goal status: MET  2.  Demo improved balance and BLE strength as evidenced by ability to perform 5xSTS test in 25 sec Baseline: able to complete 1 repetition; (09/19/23) 20 sec w/ BUE support Goal status: NOT MET  3.  Demo improved postural control exhibiting mild sway with Romberg position to improve safety with ADL Baseline: moderate; (09/19/23) mild sway condition 1 and 2  Goal status: MET    LONG TERM GOALS: Target date: 10/10/2023    Demo reduced risk for falls per score 49/56 Berg Balance Test Baseline: 38/56 Goal status: INITIAL  2.  Exhibit reduced risk for falls per time 12 sec TUG test Baseline: 16 sec w/ 9GX Goal status: INITIAL  3.  Demo improved BLE strength and reduced risk for falls per time of 15 sec 5xSTS test Baseline: 1 rep Goal status: INITIAL  4.  Demo modified independent floor to stand to improve capabilities for fall recovery Baseline: spouse reports dependence on physical assistance Goal status: INITIAL  5.  Demo ability to safely walk dog x 10 min to increase  activity participation Baseline:  Goal status: INITIAL    ASSESSMENT:  CLINICAL IMPRESSION: Initiated with strength training with emphasis on compound movements to improve strength, activity tolerance, and recruitment. Dynamic balance activities to facilitate righting reactions with push-release testing revealing present but delayed anterior reaction, delayed laterally, and absent posterior righting reactions unable to elicit ankle, hip, stepping.  Transfer training with chair angled back to replicate recliner to improve strength for arising from chair at home. Continued sessions indicated to progress POC details to improve mobility, reduce risk for falls, and enhance ground to chair recovery to reduce assist of caregiver.  OBJECTIVE IMPAIRMENTS: Abnormal gait, decreased activity tolerance, decreased balance, decreased coordination, decreased endurance, decreased mobility, difficulty walking, decreased strength, and decreased safety awareness.   ACTIVITY LIMITATIONS: carrying, lifting, bending, standing, squatting, transfers, reach over head, and locomotion level  PARTICIPATION LIMITATIONS: meal prep, cleaning, laundry, interpersonal relationship, shopping, community activity, and dog walking  PERSONAL FACTORS: Age, Time since onset of injury/illness/exacerbation, and 3+ comorbidities: PMH  are also affecting patient's functional outcome.  REHAB POTENTIAL: Good  CLINICAL DECISION MAKING: Evolving/moderate complexity  EVALUATION COMPLEXITY: Moderate  PLAN:  PT FREQUENCY: 2x/week  PT DURATION: 6 weeks  PLANNED INTERVENTIONS: 97110-Therapeutic exercises, 97530- Therapeutic activity, 97112- Neuromuscular re-education, 97535- Self Care, 16109- Manual therapy, [phone]- Gait training, [phone]- Canalith repositioning, [phone]- Aquatic Therapy, Patient/Family education, Balance training, Stair training, Taping, Dry Needling, Joint mobilization, Spinal mobilization, Vestibular training, DME  instructions, and Wheelchair mobility training  PLAN FOR NEXT SESSION: HEP review , lunge to stand with furniture for LE strength/fall recovery, general strength/balance   3:33 PM, 09/21/23 M. Shary Decamp, PT, DPT Physical Therapist- [place] Office Number: [phone]

## 2023-09-26 ENCOUNTER — Ambulatory Visit: Payer: Medicare Other

## 2023-09-26 DIAGNOSIS — M6281 Muscle weakness (generalized): Secondary | ICD-10-CM | POA: Diagnosis not present

## 2023-09-26 DIAGNOSIS — R2689 Other abnormalities of gait and mobility: Secondary | ICD-10-CM | POA: Diagnosis not present

## 2023-09-26 DIAGNOSIS — R2681 Unsteadiness on feet: Secondary | ICD-10-CM

## 2023-09-26 NOTE — Therapy (Signed)
OUTPATIENT PHYSICAL THERAPY NEURO TREATMENT   Patient Name: Adam [name] MRN: [id number] DOB:June 01, 1951, 72 y.o., male Today's Date: 09/26/2023   PCP: Evelena Peat, MD REFERRING PROVIDER: Windell Norfolk, MD  END OF SESSION:  PT End of Session - 09/26/23 1112     Visit Number 9    Number of Visits 12    Date for PT Re-Evaluation 10/10/23    Authorization Type BCBS Medicare    Progress Note Due on Visit 10    PT Start Time 1111    PT Stop Time 1156    PT Time Calculation (min) 45 min             Past Medical History:  Diagnosis Date   Alcohol withdrawal (HCC) 10/24/2019   Allergy    Anxiety    history of PTSD following CABG   Ascending aortic aneurysm (HCC) 01/31/2018   43 x 42 mm, pt unaware   Asthma    Cardiomegaly 10/17/2017   Colitis- colonoscopy 2014 07/13/2015   COPD (chronic obstructive pulmonary disease) (HCC)    Coronary artery disease    x 6   Depression    Diabetes mellitus without complication (HCC)    Family history of polyps in the colon    Finger dislocation    Left pinkie   GERD (gastroesophageal reflux disease)    Gout    H/O atrial fibrillation without current medication    following CABG with no documented episodes since then.   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   Past Surgical History:  Procedure Laterality Date   ANKLE FRACTURE SURGERY Right 1991   APPENDECTOMY     ATRIAL FIBRILLATION ABLATION N/A 03/31/2018   Procedure: ATRIAL FIBRILLATION ABLATION;  Surgeon: Hillis Range, MD;  Location: [facility]IVE [facility];  Service: Cardiovascular;  Laterality: N/A;   CARDIAC ELECTROPHYSIOLOGY MAPPING AND ABLATION     CARDIOVERSION N/A  03/23/2021   Procedure: CARDIOVERSION;  Surgeon: Elease Hashimoto Deloris Ping, MD;  Location: [facility] ENDOSCOPY;  Service: Cardiovascular;  Laterality: N/A;   COLONOSCOPY  06/11/2021   COLONOSCOPY W/ BIOPSIES  2017   x7   CORONARY ANGIOPLASTY WITH STENT PLACEMENT     CORONARY ARTERY BYPASS GRAFT     FINGER SURGERY  04/2018   Small finger left hand   implantable loop recorder placement  07/02/2019   Medtronic Reveal [place] model LNQ11 (SN [id number]) implanted in office by Dr Johney Frame   TEE WITHOUT CARDIOVERSION N/A 03/30/2018   Procedure: TRANSESOPHAGEAL ECHOCARDIOGRAM (TEE);  Surgeon: Thurmon Fair, MD;  Location: [facility] ENDOSCOPY;  Service: Cardiovascular;  Laterality: N/A;   TEE WITHOUT CARDIOVERSION N/A 03/23/2021   Procedure: TRANSESOPHAGEAL ECHOCARDIOGRAM (TEE);  Surgeon: Elease Hashimoto Deloris Ping, MD;  Location: [facility] ENDOSCOPY;  Service: Cardiovascular;  Laterality: N/A;   TOTAL HIP ARTHROPLASTY Left    TOTAL HIP ARTHROPLASTY Right 09/12/2018   Procedure: TOTAL HIP ARTHROPLASTY ANTERIOR APPROACH;  Surgeon: Durene Romans, MD;  Location: [facility] ORS;  Service: Orthopedics;  Laterality: Right;  70 mins   Patient Active Problem List   Diagnosis Date Noted   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   Aphasia 09/20/2023   Recurrent falls 09/20/2023  COPD with asthma (HCC) 03/02/2022   Wernicke encephalopathy 10/14/2021   Psoriasis 10/14/2021   Hypokalemia 07/30/2021   Hypertension 06/05/2020   Protein-calorie malnutrition (HCC) 03/01/2020   Pressure injury of skin 02/21/2020   Rapid atrial fibrillation (HCC) 02/20/2020   High anion gap metabolic acidosis 02/08/2020   Hypercoagulable state due to persistent atrial fibrillation (HCC) 11/02/2019   Paroxysmal atrial fibrillation (HCC) 10/24/2019   Degeneration of lumbar intervertebral disc 09/27/2019   Thoracic aortic aneurysm (HCC) 02/08/2019   Hemoptysis 10/06/2018   S/P right THA, 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   S/P hip replacement 09/12/2018   Low back pain 05/19/2018   Sleep apnea  01/09/2018   Persistent atrial fibrillation 12/12/2017   Allergic rhinitis 08/22/2015   Family history of colon cancer in mother deceased age 95 23-Jul-2015   Type 2 diabetes mellitus, controlled (HCC) 07/17/2013   COPD with acute exacerbation (HCC) 07/05/2013   CAD (coronary artery disease) 06/26/2013   Hypertension associated with diabetes (HCC) 06/26/2013   Hyperlipidemia associated with type 2 diabetes mellitus (HCC) 06/26/2013   GERD (gastroesophageal reflux disease) 06/26/2013   History of atrial fibrillation without current medication 06/26/2013   Gout 06/26/2013   Osteoarthritis 06/26/2013   Asthma, mild persistent 06/26/2013   Alcohol abuse 06/26/2013   PTSD (post-traumatic stress disorder) 06/26/2013   HLA B27 (HLA B27 positive) 06/26/2013   Obesity (BMI 30-39.9) 06/26/2013   Benign essential hypertension 06/26/2013    ONSET DATE: 10/14/21  REFERRING DIAG: R29.6 (ICD-10-CM) - Recurrent falls  THERAPY DIAG:  Unsteadiness on feet  Other abnormalities of gait and mobility  Muscle weakness (generalized)  Rationale for Evaluation and Treatment: Rehabilitation  SUBJECTIVE:                                                                                                                                                                                             SUBJECTIVE STATEMENT: Had a dizzy episode and fell last night, able to get up on own w/ walker and throw rug for knee padding. Notes left hip is sore, no brusing appreciated, able to move extremity and bear weight with slight discomfort Pt accompanied by: significant other [name]  PERTINENT HISTORY: Chronic alcohol, COPD, Heart disease, DM, Gout, Depression, HLD, bilat THR, hx of CABG. He is able to understand and communicate with writing and gesture, sometimes gets frustrated when wife does not understand him.  He is still having gait abnormality.  Even with the walker, he continued to fall.  He continues to drink  alcohol daily, even though he decreased the amount of alcohol.  Wife tells me that he has lost a lot of  weight almost 100 pounds due to the fact that he is choking, he cannot swallow, he drools all the time.  They tell me that they have not seen GI or have any new swallow study or endoscopy done. He is still have sign of his Wernicke including pseudobulbar affect   PAIN:  Are you having pain? No, right shoulder is sore from recent fall  PRECAUTIONS: Fall  RED FLAGS: None   WEIGHT BEARING RESTRICTIONS: No  FALLS: Has patient fallen in last 6 months?  "Too many to count"  LIVING ENVIRONMENT: Lives with: lives with their spouse Lives in: House/apartment Stairs: No Has following equipment at home: Environmental consultant - 4 wheeled and Grab bars  PLOF: Independent with basic ADLs and Independent with household mobility with device  PATIENT GOALS: "walk without falling. Be able to walk the dog "Jessie" (medium sized dog). Be able to get up from ground after a fall.  OBJECTIVE:   TODAY'S TREATMENT: 09/26/23 Activity Comments  Single leg stance Loading on LLE to gauge tolerance, reports no issue  Alt stair taps x 1.5 min 6" BUE support  Sidestep x 2 min BUE support  Kicking physioball x 60 sec CGA-min A for stability  Felt dizzy--positional BPPV checks--no dizziness/nystagmus replicated   Retro-walk x 25 ft   LAQ 3x10 5#  Seated unilat UE row 2x10 15#                 PATIENT EDUCATION: Education details: assessment details, rationale of PT intervention, HEP initiation Person educated: Patient and Spouse Education method: Explanation and Handouts Education comprehension: verbalized understanding  HOME EXERCISE PROGRAM: Access Code: [facility] URL: [email or web address] Date: 08/29/2023 Prepared by: Shary Decamp  Exercises - Standing Single Leg Stance with Counter Support  - 1 x daily - 7 x weekly - 3 sets - 10 sec hold - Side Stepping with Counter Support  - 1 x  daily - 7 x weekly - 1-3 sets - 2 min hold - Mini Squat with Counter Support  - 1 x daily - 7 x weekly - 3 sets - 5 reps  Note: Objective measures were completed at Evaluation unless otherwise noted.  DIAGNOSTIC FINDINGS: N/A for this episode  COGNITION: Overall cognitive status: History of cognitive impairments - at baseline   SENSATION: Not tested, reports neuropathy in BLE  COORDINATION: Difficulty with rapid alternating movements Heel to shin impaired LLE>RLE Unable to close eyes to perform finger to nose  EDEMA:  None present  MUSCLE TONE: NT  MUSCLE LENGTH: WFL  DTRs:  NT  POSTURE: No Significant postural limitations  LOWER EXTREMITY ROM:     Active  Right Eval Left Eval  Hip flexion 110+ 110+  Hip extension    Hip abduction    Hip adduction    Hip internal rotation    Hip external rotation    Knee flexion 120 120  Knee extension 0 0  Ankle dorsiflexion 15 15  Ankle plantarflexion    Ankle inversion    Ankle eversion     (Blank rows = not tested)  LOWER EXTREMITY MMT:    MMT Right Eval Left Eval  Hip flexion 3+ 3+  Hip extension    Hip abduction 3+ 3+  Hip adduction 3+ 3+  Hip internal rotation    Hip external rotation    Knee flexion 5 5  Knee extension 4 4  Ankle dorsiflexion 4 4  Ankle plantarflexion    Ankle inversion    Ankle eversion    (  Blank rows = not tested)  BED MOBILITY:  Independent  TRANSFERS: Assistive device utilized: Environmental consultant - 4 wheeled  Sit to stand: Modified independence and SBA Stand to sit: Modified independence Chair to chair: Modified independence and SBA Floor:  NT but spouse reports usually requires male friend to assist when performing fall recovery from floor  RAMP:  Level of Assistance: SBA and CGA Assistive device utilized: Environmental consultant - 4 wheeled Ramp Comments:   CURB:  Level of Assistance: CGA Assistive device utilized: Environmental consultant - 4 wheeled Curb Comments:   STAIRS: NT, reports no stairs at  home  GAIT: Gait pattern:  some instances of shuffling, RLE deficits from hx of orthopedic injuries/surgeries Distance walked:  Assistive device utilized: Environmental consultant - 4 wheeled Level of assistance: Modified independence and SBA Comments:   FUNCTIONAL TESTS:  5 times sit to stand: 1 rep Timed up and go (TUG): 16 sec w/ 1YN Berg Balance Scale: 38/56 Romberg: mild-moderate sway eyes open, unable to volitionally close eyes. Therapist obstructs vision with paper which caused worse imbalance    GOALS: Goals reviewed with patient? Yes  SHORT TERM GOALS: Target date: 09/19/2023    Patient will be independent in HEP to improve functional outcomes Baseline: Goal status: MET  2.  Demo improved balance and BLE strength as evidenced by ability to perform 5xSTS test in 25 sec Baseline: able to complete 1 repetition; (09/19/23) 20 sec w/ BUE support Goal status: NOT MET  3.  Demo improved postural control exhibiting mild sway with Romberg position to improve safety with ADL Baseline: moderate; (09/19/23) mild sway condition 1 and 2  Goal status: MET    LONG TERM GOALS: Target date: 10/10/2023    Demo reduced risk for falls per score 49/56 Berg Balance Test Baseline: 38/56 Goal status: INITIAL  2.  Exhibit reduced risk for falls per time 12 sec TUG test Baseline: 16 sec w/ 8GN Goal status: INITIAL  3.  Demo improved BLE strength and reduced risk for falls per time of 15 sec 5xSTS test Baseline: 1 rep Goal status: INITIAL  4.  Demo modified independent floor to stand to improve capabilities for fall recovery Baseline: spouse reports dependence on physical assistance Goal status: INITIAL  5.  Demo ability to safely walk dog x 10 min to increase activity participation Baseline:  Goal status: INITIAL    ASSESSMENT:  CLINICAL IMPRESSION: Reports fall last night with left hip feeling sore but no red flag signs evident. Pt and spouse report he was able to perform modified indep  fall recovery transfer.  Initiated activities with single leg stance support to improve LE stability and singe leg balance for gait/stairs with ability to tolerate without increased hip pain. Continued with balance training via kicking physioball for postural perturbation and single leg stance during course of which felt dizzy/lightheaded. Repeat of positional testing without provocation. Seated strengthening remainder of session to improve strength, recruitment, and activity tolerance. Continued sessions to continue POC details to improve strength and balance  OBJECTIVE IMPAIRMENTS: Abnormal gait, decreased activity tolerance, decreased balance, decreased coordination, decreased endurance, decreased mobility, difficulty walking, decreased strength, and decreased safety awareness.   ACTIVITY LIMITATIONS: carrying, lifting, bending, standing, squatting, transfers, reach over head, and locomotion level  PARTICIPATION LIMITATIONS: meal prep, cleaning, laundry, interpersonal relationship, shopping, community activity, and dog walking  PERSONAL FACTORS: Age, Time since onset of injury/illness/exacerbation, and 3+ comorbidities: PMH  are also affecting patient's functional outcome.   REHAB POTENTIAL: Good  CLINICAL DECISION MAKING: Evolving/moderate complexity  EVALUATION COMPLEXITY: Moderate  PLAN:  PT FREQUENCY: 2x/week  PT DURATION: 6 weeks  PLANNED INTERVENTIONS: 97110-Therapeutic exercises, 97530- Therapeutic activity, 97112- Neuromuscular re-education, 97535- Self Care, [id number]- Manual therapy, [phone]- Gait training, [phone]- Canalith repositioning, [phone]- Aquatic Therapy, Patient/Family education, Balance training, Stair training, Taping, Dry Needling, Joint mobilization, Spinal mobilization, Vestibular training, DME instructions, and Wheelchair mobility training  PLAN FOR NEXT SESSION:  Progress Note   11:56 AM, 09/26/23 M. Shary Decamp, PT, DPT Physical Therapist- [place] Office  Number: [phone]

## 2023-09-27 ENCOUNTER — Other Ambulatory Visit: Payer: Self-pay | Admitting: Family Medicine

## 2023-09-27 ENCOUNTER — Emergency Department (HOSPITAL_COMMUNITY)
Admission: EM | Admit: 2023-09-27 | Discharge: 2023-09-27 | Disposition: A | Discharge status: Home / Self Care | Payer: Medicare Other | Attending: Emergency Medicine | Admitting: Emergency Medicine

## 2023-09-27 ENCOUNTER — Emergency Department (HOSPITAL_COMMUNITY): Payer: Medicare Other

## 2023-09-27 DIAGNOSIS — Z043 Encounter for examination and observation following other accident: Secondary | ICD-10-CM | POA: Diagnosis not present

## 2023-09-27 DIAGNOSIS — M542 Cervicalgia: Secondary | ICD-10-CM | POA: Insufficient documentation

## 2023-09-27 DIAGNOSIS — Z96642 Presence of left artificial hip joint: Secondary | ICD-10-CM | POA: Diagnosis not present

## 2023-09-27 DIAGNOSIS — I2510 Atherosclerotic heart disease of native coronary artery without angina pectoris: Secondary | ICD-10-CM | POA: Diagnosis not present

## 2023-09-27 DIAGNOSIS — J449 Chronic obstructive pulmonary disease, unspecified: Secondary | ICD-10-CM | POA: Insufficient documentation

## 2023-09-27 DIAGNOSIS — W19XXXA Unspecified fall, initial encounter: Secondary | ICD-10-CM | POA: Diagnosis not present

## 2023-09-27 DIAGNOSIS — M25511 Pain in right shoulder: Secondary | ICD-10-CM | POA: Insufficient documentation

## 2023-09-27 DIAGNOSIS — M79651 Pain in right thigh: Secondary | ICD-10-CM | POA: Diagnosis not present

## 2023-09-27 DIAGNOSIS — M7652 Patellar tendinitis, left knee: Secondary | ICD-10-CM | POA: Diagnosis not present

## 2023-09-27 DIAGNOSIS — Z85828 Personal history of other malignant neoplasm of skin: Secondary | ICD-10-CM | POA: Insufficient documentation

## 2023-09-27 DIAGNOSIS — Z79899 Other long term (current) drug therapy: Secondary | ICD-10-CM | POA: Diagnosis not present

## 2023-09-27 DIAGNOSIS — S0101XA Laceration without foreign body of scalp, initial encounter: Secondary | ICD-10-CM | POA: Insufficient documentation

## 2023-09-27 DIAGNOSIS — M1712 Unilateral primary osteoarthritis, left knee: Secondary | ICD-10-CM | POA: Diagnosis not present

## 2023-09-27 DIAGNOSIS — S199XXA Unspecified injury of neck, initial encounter: Secondary | ICD-10-CM | POA: Diagnosis not present

## 2023-09-27 DIAGNOSIS — W01198A Fall on same level from slipping, tripping and stumbling with subsequent striking against other object, initial encounter: Secondary | ICD-10-CM | POA: Diagnosis not present

## 2023-09-27 DIAGNOSIS — Z7901 Long term (current) use of anticoagulants: Secondary | ICD-10-CM | POA: Diagnosis not present

## 2023-09-27 DIAGNOSIS — I10 Essential (primary) hypertension: Secondary | ICD-10-CM | POA: Insufficient documentation

## 2023-09-27 DIAGNOSIS — I771 Stricture of artery: Secondary | ICD-10-CM | POA: Diagnosis not present

## 2023-09-27 DIAGNOSIS — M25462 Effusion, left knee: Secondary | ICD-10-CM | POA: Diagnosis not present

## 2023-09-27 DIAGNOSIS — J45909 Unspecified asthma, uncomplicated: Secondary | ICD-10-CM | POA: Diagnosis not present

## 2023-09-27 DIAGNOSIS — Z23 Encounter for immunization: Secondary | ICD-10-CM | POA: Insufficient documentation

## 2023-09-27 DIAGNOSIS — S0003XA Contusion of scalp, initial encounter: Secondary | ICD-10-CM | POA: Diagnosis not present

## 2023-09-27 DIAGNOSIS — G9389 Other specified disorders of brain: Secondary | ICD-10-CM | POA: Diagnosis not present

## 2023-09-27 DIAGNOSIS — R000 Tachycardia, unspecified: Secondary | ICD-10-CM | POA: Diagnosis not present

## 2023-09-27 DIAGNOSIS — S0990XA Unspecified injury of head, initial encounter: Secondary | ICD-10-CM | POA: Diagnosis not present

## 2023-09-27 DIAGNOSIS — E119 Type 2 diabetes mellitus without complications: Secondary | ICD-10-CM | POA: Diagnosis not present

## 2023-09-27 DIAGNOSIS — Z96643 Presence of artificial hip joint, bilateral: Secondary | ICD-10-CM | POA: Diagnosis not present

## 2023-09-27 LAB — CBC WITH DIFFERENTIAL/PLATELET
Abs Immature Granulocytes: 0.05 10*3/uL (ref 0.00–0.07)
Basophils Absolute: 0 10*3/uL (ref 0.0–0.1)
Basophils Relative: 0 %
Eosinophils Absolute: 0.1 10*3/uL (ref 0.0–0.5)
Eosinophils Relative: 1 %
HCT: 33.9 % — ABNORMAL LOW (ref 39.0–52.0)
Hemoglobin: 10.9 g/dL — ABNORMAL LOW (ref 13.0–17.0)
Immature Granulocytes: 1 %
Lymphocytes Relative: 17 %
Lymphs Abs: 1.6 10*3/uL (ref 0.7–4.0)
MCH: 31.8 pg (ref 26.0–34.0)
MCHC: 32.2 g/dL (ref 30.0–36.0)
MCV: 98.8 fL (ref 80.0–100.0)
Monocytes Absolute: 1.4 10*3/uL — ABNORMAL HIGH (ref 0.1–1.0)
Monocytes Relative: 15 %
Neutro Abs: 6.1 10*3/uL (ref 1.7–7.7)
Neutrophils Relative %: 66 %
Platelets: 197 10*3/uL (ref 150–400)
RBC: 3.43 MIL/uL — ABNORMAL LOW (ref 4.22–5.81)
RDW: 15.1 % (ref 11.5–15.5)
WBC: 9.3 10*3/uL (ref 4.0–10.5)
nRBC: 0 % (ref 0.0–0.2)

## 2023-09-27 LAB — COMPREHENSIVE METABOLIC PANEL
ALT: 22 U/L (ref 0–44)
AST: 29 U/L (ref 15–41)
Albumin: 3.5 g/dL (ref 3.5–5.0)
Alkaline Phosphatase: 45 U/L (ref 38–126)
Anion gap: 11 (ref 5–15)
BUN: 17 mg/dL (ref 8–23)
CO2: 25 mmol/L (ref 22–32)
Calcium: 9.8 mg/dL (ref 8.9–10.3)
Chloride: 107 mmol/L (ref 98–111)
Creatinine, Ser: 1.03 mg/dL (ref 0.61–1.24)
GFR, Estimated: >60 mL/min (ref >60)
Glucose, Bld: 125 mg/dL — ABNORMAL HIGH (ref 70–99)
Potassium: 4 mmol/L (ref 3.5–5.1)
Sodium: 143 mmol/L (ref 135–145)
Total Bilirubin: 0.8 mg/dL (ref <1.2)
Total Protein: 6.5 g/dL (ref 6.5–8.1)

## 2023-09-27 LAB — URINALYSIS, ROUTINE W REFLEX MICROSCOPIC
Bilirubin Urine: NEGATIVE
Glucose, UA: >=500 mg/dL — AB
Hgb urine dipstick: NEGATIVE
Ketones, ur: NEGATIVE mg/dL
Leukocytes,Ua: NEGATIVE
Nitrite: NEGATIVE
Protein, ur: 100 mg/dL — AB
Specific Gravity, Urine: 1.028 (ref 1.005–1.030)
pH: 7 (ref 5.0–8.0)

## 2023-09-27 LAB — CK: Total CK: 301 U/L (ref 49–397)

## 2023-09-27 MED ORDER — LIDOCAINE-EPINEPHRINE 1 %-1:100000 IJ SOLN
20.0000 mL | Freq: Once | INTRAMUSCULAR | Status: AC
  Administered 2023-09-27: 20 mL via INTRADERMAL

## 2023-09-27 MED ORDER — TETANUS-DIPHTH-ACELL PERTUSSIS 5-2.5-18.5 LF-MCG/0.5 IM SUSY
0.5000 mL | PREFILLED_SYRINGE | Freq: Once | INTRAMUSCULAR | Status: AC
  Administered 2023-09-27: 0.5 mL via INTRAMUSCULAR
  Filled 2023-09-27: qty 0.5

## 2023-09-27 MED ORDER — LIDOCAINE-EPINEPHRINE (PF) 2 %-1:200000 IJ SOLN
INTRAMUSCULAR | Status: AC
  Filled 2023-09-27: qty 20
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
## 2023-09-27 NOTE — ED Provider Notes (Signed)
.  Laceration Repair  Date/Time: 09/27/2023 8:20 PM  Performed by: Arthor Captain, PA-C Authorized by: Arthor Captain, PA-C   Consent:    Consent obtained:  Verbal   Consent given by:  Patient   Risks discussed:  Need for additional repair, infection, pain, poor cosmetic result, nerve damage and poor wound healing   Alternatives discussed:  No treatment Universal protocol:    Patient identity confirmed:  Arm band Anesthesia:    Anesthesia method:  Local infiltration   Local anesthetic:  Lidocaine 2% WITH epi Laceration details:    Location:  Scalp   Scalp location:  Occipital   Length (cm):  3 Pre-procedure details:    Preparation:  Patient was prepped and draped in usual sterile fashion Exploration:    Wound exploration: wound explored through full range of motion and entire depth of wound visualized   Treatment:    Area cleansed with:  Chlorhexidine   Amount of cleaning:  Standard   Irrigation solution:  Sterile saline   Irrigation method:  Syringe Skin repair:    Repair method:  Staples   Number of staples:  3 Approximation:    Approximation:  Close Repair type:    Repair type:  Simple Post-procedure details:    Dressing:  Open (no dressing)   Procedure completion:  Tolerated well, no immediate complications     Arthor Captain, PA-C 09/27/23 2023    Loetta Rough, MD 10/02/23 [phone]

## 2023-09-27 NOTE — Discharge Instructions (Signed)
Thank you for coming to [facility] Emergency Department. You were seen for fall with head injury. We did an exam, labs, and imaging, and these showed a laceration to your scalp which was repaired with staples. These will need to be removed by medical professional in the next 7 to 10 days.  Please watch for signs of infection including redness, increased pain, increased swelling, pus discharge, or fever/chills. Please follow up with your primary care provider within 1 week.   Do not hesitate to return to the ED or call 911 if you experience: -Worsening symptoms -Visual changes -Lightheadedness, passing out -Fevers/chills -Anything else that concerns you
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
## 2023-09-27 NOTE — ED Notes (Signed)
Handoff received from [facility]; c-colllar remains in place - pt alert and nonverbal (baseline) - following commands
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
## 2023-09-27 NOTE — ED Notes (Signed)
Wife [name] called - updated on POC/ discharge; states will here to pick up patient in approx 30 min

## 2023-09-29 ENCOUNTER — Ambulatory Visit: Payer: Medicare Other

## 2023-09-29 ENCOUNTER — Telehealth: Payer: Self-pay

## 2023-09-29 NOTE — Transitions of Care (Post Inpatient/ED Visit) (Signed)
 09/29/2023  Name: Adam [name] MRN: [id number] DOB: 12-Apr-1951  Today's TOC FU Call Status: Today's TOC FU Call Status:: Successful TOC FU Call Completed TOC FU Call Complete Date: 09/29/23 Patient's Name and Date of Birth confirmed.  Transition Care Management Follow-up Telephone Call Date of Discharge: 09/27/23 Discharge Facility: [facility]) Type of Discharge: Emergency Department Reason for ED Visit: Other: (unspecified fall, initial encounter) How have you been since you were released from the hospital?: Better (pt's wife states patient is worse than the time before he hit his head. from the time of discharge til today he may be a "tad better") Any questions or concerns?: No  Items Reviewed: Did you receive and understand the discharge instructions provided?: Yes Medications obtained,verified, and reconciled?: Yes (Medications Reviewed) Any new allergies since your discharge?: No Dietary orders reviewed?: NA Do you have support at home?: Yes People in Home: spouse  Medications Reviewed Today: Medications Reviewed Today     Reviewed by Marysa Wessner, [name] Hawks, CMA (Certified Medical Assistant) on 09/29/23 at 1507  Med List Status: <None>   Medication Order Taking? Sig Documenting Provider Last Dose Status Informant  allopurinol (ZYLOPRIM) 300 MG tablet [id number]  TAKE 1 TABLET(300 MG) BY MOUTH DAILY [name], Elberta Fortis, MD  Active   amiodarone (PACERONE) 200 MG tablet [id number] No TAKE 1 TABLET(200 MG) BY MOUTH DAILY [name], [name], PA Taking Active   apixaban (ELIQUIS) 5 MG TABS tablet [id number] No TAKE 1 TABLET(5 MG) BY MOUTH TWICE DAILY [place], [name], MD Taking Active   Dextromethorphan-quiNIDine (NUEDEXTA) 20-10 MG capsule [id number] No Take 1 capsule by mouth daily. Windell Norfolk, MD Taking Active   DULoxetine (CYMBALTA) 60 MG capsule [id number] No Take one capsule by mouth once daily Kristian Covey, MD Taking Active   gabapentin (NEURONTIN) 300 MG capsule  [id number]  TAKE 2 CAPSULES BY MOUTH AT NIGHT AS NEEDED FOR RESTLESS LEGS OR SYMPTOMS Kristian Covey, MD  Active   HM LIDOCAINE [facility] EX [id number] No Apply 1 patch topically as needed (pain). [provider] Taking Active Self  ipratropium (ATROVENT) 0.03 % nasal spray [id number]  Place 2 sprays into both nostrils See admin instructions. Instill 2 sprays into each nostril 2-3 times a day. [provider]  Active   lisinopril (ZESTRIL) 20 MG tablet [id number]  TAKE 1 TABLET(20 MG) BY MOUTH DAILY Kristian Covey, MD  Active   Magnesium Oxide 400 MG CAPS [id number] No Take 1 capsule (400 mg total) by mouth daily. Kristian Covey, MD Taking Active   metoprolol tartrate (LOPRESSOR) 50 MG tablet [id number] No TAKE 1 TABLET(50 MG) BY MOUTH TWICE DAILY Worthy Rancher B, [name] Taking Active   montelukast (SINGULAIR) 10 MG tablet [id number] No Take 1 tablet (10 mg total) by mouth at bedtime. Kristian Covey, MD Taking Active   PREVIDENT 5000 ENAMEL PROTECT 1.1-5 % GEL [id number] No Take by mouth. [provider] Taking Active   rosuvastatin (CRESTOR) 40 MG tablet [id number] No TAKE 1 TABLET(40 MG) BY MOUTH DAILY [place], [name], MD Taking Active   SKYRIZI PEN 150 MG/ML SOAJ [id number] No Inject into the skin. Takes every 3 months [provider] Taking Active   sodium chloride (OCEAN) 0.65 % SOLN nasal spray [id number] No Place 1 spray into both nostrils as needed for congestion. [provider] Taking Active   Spacer/Aero-Holding [facility] [id number] No Use with inhaler Noemi Chapel, NP Taking Active   SYNJARDY XR 25-1000 MG TB24 [id number]  TAKE 1 TABLET BY MOUTH DAILY [name], Elberta Fortis, MD  Active   thiamine (VITAMIN B-1) 100 MG tablet [id number] No Take 500 mg by mouth daily. [provider] Taking Active Self  triamcinolone cream (KENALOG) 0.1 % [id number] No SMARTSIG:1 Application Topical 2-3 Times Daily [provider] Taking Active              Home Care and Equipment/Supplies: Were Home Health Services Ordered?: NA Any new equipment or medical supplies ordered?: NA  Functional Questionnaire: Do you need assistance with bathing/showering or dressing?: Yes Do you need assistance with meal preparation?: Yes Do you need assistance with eating?: No Do you have difficulty maintaining continence: No Do you need assistance with getting out of bed/getting out of a chair/moving?: Yes Do you have difficulty managing or taking your medications?: No  Follow up appointments reviewed: PCP Follow-up appointment confirmed?: Yes Date of PCP follow-up appointment?: 10/04/23 Follow-up Provider: B. [name], MD Specialist Hospital Follow-up appointment confirmed?: NA Do you need transportation to your follow-up appointment?: No Do you understand care options if your condition(s) worsen?: Yes-patient verbalized understanding  Patient's wife states he is weak and is having a hard time walking.    Ikey Omary, CMA  [facility] AWV Team Direct Dial: [phone]

## 2023-09-30 ENCOUNTER — Emergency Department (HOSPITAL_COMMUNITY): Payer: Medicare Other

## 2023-09-30 ENCOUNTER — Inpatient Hospital Stay (HOSPITAL_COMMUNITY)
Admission: EM | Admit: 2023-09-30 | Discharge: 2023-10-10 | DRG: 811 | Disposition: A | Discharge status: Home Health | Payer: Medicare Other | Attending: Internal Medicine | Admitting: Internal Medicine

## 2023-09-30 ENCOUNTER — Encounter (HOSPITAL_COMMUNITY): Payer: Self-pay

## 2023-09-30 ENCOUNTER — Other Ambulatory Visit: Payer: Self-pay

## 2023-09-30 ENCOUNTER — Telehealth: Payer: Self-pay

## 2023-09-30 DIAGNOSIS — I2510 Atherosclerotic heart disease of native coronary artery without angina pectoris: Secondary | ICD-10-CM

## 2023-09-30 DIAGNOSIS — E785 Hyperlipidemia, unspecified: Secondary | ICD-10-CM

## 2023-09-30 DIAGNOSIS — E876 Hypokalemia: Secondary | ICD-10-CM

## 2023-09-30 DIAGNOSIS — I712 Thoracic aortic aneurysm, without rupture, unspecified: Secondary | ICD-10-CM

## 2023-09-30 DIAGNOSIS — F1010 Alcohol abuse, uncomplicated: Secondary | ICD-10-CM

## 2023-09-30 DIAGNOSIS — I480 Paroxysmal atrial fibrillation: Secondary | ICD-10-CM

## 2023-09-30 DIAGNOSIS — E512 Wernicke's encephalopathy: Secondary | ICD-10-CM | POA: Insufficient documentation

## 2023-09-30 DIAGNOSIS — R1310 Dysphagia, unspecified: Secondary | ICD-10-CM

## 2023-09-30 DIAGNOSIS — S0990XA Unspecified injury of head, initial encounter: Secondary | ICD-10-CM

## 2023-09-30 DIAGNOSIS — R4701 Aphasia: Secondary | ICD-10-CM

## 2023-09-30 DIAGNOSIS — K219 Gastro-esophageal reflux disease without esophagitis: Secondary | ICD-10-CM

## 2023-09-30 DIAGNOSIS — D649 Anemia, unspecified: Secondary | ICD-10-CM | POA: Diagnosis present

## 2023-09-30 DIAGNOSIS — E119 Type 2 diabetes mellitus without complications: Secondary | ICD-10-CM

## 2023-09-30 DIAGNOSIS — F32A Depression, unspecified: Secondary | ICD-10-CM

## 2023-09-30 DIAGNOSIS — G4733 Obstructive sleep apnea (adult) (pediatric): Secondary | ICD-10-CM | POA: Insufficient documentation

## 2023-09-30 DIAGNOSIS — I10 Essential (primary) hypertension: Secondary | ICD-10-CM

## 2023-09-30 DIAGNOSIS — Y92009 Unspecified place in unspecified non-institutional (private) residence as the place of occurrence of the external cause: Secondary | ICD-10-CM

## 2023-09-30 DIAGNOSIS — E43 Unspecified severe protein-calorie malnutrition: Secondary | ICD-10-CM | POA: Insufficient documentation

## 2023-09-30 DIAGNOSIS — G1229 Other motor neuron disease: Secondary | ICD-10-CM

## 2023-09-30 DIAGNOSIS — S0181XA Laceration without foreign body of other part of head, initial encounter: Principal | ICD-10-CM

## 2023-09-30 DIAGNOSIS — J4489 Other specified chronic obstructive pulmonary disease: Secondary | ICD-10-CM

## 2023-09-30 DIAGNOSIS — Z789 Other specified health status: Secondary | ICD-10-CM | POA: Diagnosis not present

## 2023-09-30 DIAGNOSIS — Z66 Do not resuscitate: Secondary | ICD-10-CM | POA: Diagnosis present

## 2023-09-30 DIAGNOSIS — Y92018 Other place in single-family (private) house as the place of occurrence of the external cause: Secondary | ICD-10-CM | POA: Diagnosis not present

## 2023-09-30 DIAGNOSIS — Y909 Presence of alcohol in blood, level not specified: Secondary | ICD-10-CM | POA: Diagnosis present

## 2023-09-30 DIAGNOSIS — S0101XA Laceration without foreign body of scalp, initial encounter: Secondary | ICD-10-CM | POA: Diagnosis present

## 2023-09-30 DIAGNOSIS — S40812A Abrasion of left upper arm, initial encounter: Secondary | ICD-10-CM | POA: Diagnosis present

## 2023-09-30 DIAGNOSIS — R54 Age-related physical debility: Secondary | ICD-10-CM | POA: Diagnosis present

## 2023-09-30 DIAGNOSIS — S40811A Abrasion of right upper arm, initial encounter: Secondary | ICD-10-CM | POA: Diagnosis not present

## 2023-09-30 DIAGNOSIS — Z9181 History of falling: Secondary | ICD-10-CM

## 2023-09-30 DIAGNOSIS — F10129 Alcohol abuse with intoxication, unspecified: Secondary | ICD-10-CM | POA: Diagnosis not present

## 2023-09-30 DIAGNOSIS — R531 Weakness: Secondary | ICD-10-CM | POA: Diagnosis not present

## 2023-09-30 DIAGNOSIS — S7012XA Contusion of left thigh, initial encounter: Secondary | ICD-10-CM | POA: Diagnosis not present

## 2023-09-30 DIAGNOSIS — S01411A Laceration without foreign body of right cheek and temporomandibular area, initial encounter: Secondary | ICD-10-CM | POA: Diagnosis present

## 2023-09-30 DIAGNOSIS — K8020 Calculus of gallbladder without cholecystitis without obstruction: Secondary | ICD-10-CM | POA: Diagnosis present

## 2023-09-30 DIAGNOSIS — Z7189 Other specified counseling: Secondary | ICD-10-CM | POA: Diagnosis not present

## 2023-09-30 DIAGNOSIS — I959 Hypotension, unspecified: Secondary | ICD-10-CM | POA: Diagnosis present

## 2023-09-30 DIAGNOSIS — T45515A Adverse effect of anticoagulants, initial encounter: Secondary | ICD-10-CM | POA: Diagnosis present

## 2023-09-30 DIAGNOSIS — Z6821 Body mass index (BMI) 21.0-21.9, adult: Secondary | ICD-10-CM

## 2023-09-30 DIAGNOSIS — Z79899 Other long term (current) drug therapy: Secondary | ICD-10-CM | POA: Diagnosis not present

## 2023-09-30 DIAGNOSIS — M85862 Other specified disorders of bone density and structure, left lower leg: Secondary | ICD-10-CM | POA: Diagnosis not present

## 2023-09-30 DIAGNOSIS — Y901 Blood alcohol level of 20-39 mg/100 ml: Secondary | ICD-10-CM | POA: Diagnosis present

## 2023-09-30 DIAGNOSIS — S3993XA Unspecified injury of pelvis, initial encounter: Secondary | ICD-10-CM | POA: Diagnosis not present

## 2023-09-30 DIAGNOSIS — W01190A Fall on same level from slipping, tripping and stumbling with subsequent striking against furniture, initial encounter: Secondary | ICD-10-CM | POA: Diagnosis present

## 2023-09-30 DIAGNOSIS — I7121 Aneurysm of the ascending aorta, without rupture: Secondary | ICD-10-CM | POA: Diagnosis present

## 2023-09-30 DIAGNOSIS — S8002XA Contusion of left knee, initial encounter: Secondary | ICD-10-CM | POA: Diagnosis present

## 2023-09-30 DIAGNOSIS — W19XXXA Unspecified fall, initial encounter: Secondary | ICD-10-CM

## 2023-09-30 DIAGNOSIS — Z23 Encounter for immunization: Secondary | ICD-10-CM | POA: Diagnosis not present

## 2023-09-30 DIAGNOSIS — R9431 Abnormal electrocardiogram [ECG] [EKG]: Secondary | ICD-10-CM | POA: Diagnosis not present

## 2023-09-30 DIAGNOSIS — I6523 Occlusion and stenosis of bilateral carotid arteries: Secondary | ICD-10-CM | POA: Diagnosis not present

## 2023-09-30 DIAGNOSIS — Z7901 Long term (current) use of anticoagulants: Secondary | ICD-10-CM | POA: Diagnosis not present

## 2023-09-30 DIAGNOSIS — Z951 Presence of aortocoronary bypass graft: Secondary | ICD-10-CM

## 2023-09-30 DIAGNOSIS — S0081XA Abrasion of other part of head, initial encounter: Secondary | ICD-10-CM | POA: Diagnosis not present

## 2023-09-30 DIAGNOSIS — D6832 Hemorrhagic disorder due to extrinsic circulating anticoagulants: Secondary | ICD-10-CM | POA: Diagnosis present

## 2023-09-30 DIAGNOSIS — R404 Transient alteration of awareness: Secondary | ICD-10-CM | POA: Diagnosis not present

## 2023-09-30 DIAGNOSIS — Z955 Presence of coronary angioplasty implant and graft: Secondary | ICD-10-CM | POA: Diagnosis not present

## 2023-09-30 DIAGNOSIS — Z515 Encounter for palliative care: Secondary | ICD-10-CM | POA: Diagnosis not present

## 2023-09-30 DIAGNOSIS — D62 Acute posthemorrhagic anemia: Secondary | ICD-10-CM | POA: Diagnosis not present

## 2023-09-30 DIAGNOSIS — S8992XA Unspecified injury of left lower leg, initial encounter: Secondary | ICD-10-CM | POA: Diagnosis not present

## 2023-09-30 DIAGNOSIS — S7002XA Contusion of left hip, initial encounter: Secondary | ICD-10-CM | POA: Diagnosis present

## 2023-09-30 DIAGNOSIS — M85852 Other specified disorders of bone density and structure, left thigh: Secondary | ICD-10-CM | POA: Diagnosis not present

## 2023-09-30 DIAGNOSIS — S79922A Unspecified injury of left thigh, initial encounter: Secondary | ICD-10-CM | POA: Diagnosis not present

## 2023-09-30 DIAGNOSIS — S299XXA Unspecified injury of thorax, initial encounter: Secondary | ICD-10-CM | POA: Diagnosis not present

## 2023-09-30 DIAGNOSIS — R296 Repeated falls: Secondary | ICD-10-CM | POA: Diagnosis present

## 2023-09-30 DIAGNOSIS — Z96643 Presence of artificial hip joint, bilateral: Secondary | ICD-10-CM | POA: Diagnosis not present

## 2023-09-30 DIAGNOSIS — R58 Hemorrhage, not elsewhere classified: Secondary | ICD-10-CM | POA: Diagnosis not present

## 2023-09-30 DIAGNOSIS — S199XXA Unspecified injury of neck, initial encounter: Secondary | ICD-10-CM | POA: Diagnosis not present

## 2023-09-30 DIAGNOSIS — S2241XA Multiple fractures of ribs, right side, initial encounter for closed fracture: Secondary | ICD-10-CM | POA: Diagnosis not present

## 2023-09-30 DIAGNOSIS — Z7982 Long term (current) use of aspirin: Secondary | ICD-10-CM

## 2023-09-30 LAB — HEMOGLOBIN AND HEMATOCRIT, BLOOD
HCT: 22.6 % — ABNORMAL LOW (ref 39.0–52.0)
Hemoglobin: 7.4 g/dL — ABNORMAL LOW (ref 13.0–17.0)

## 2023-09-30 LAB — COMPREHENSIVE METABOLIC PANEL
ALT: 30 U/L (ref 0–44)
AST: 49 U/L — ABNORMAL HIGH (ref 15–41)
Albumin: 3.1 g/dL — ABNORMAL LOW (ref 3.5–5.0)
Alkaline Phosphatase: 40 U/L (ref 38–126)
Anion gap: 14 (ref 5–15)
BUN: 18 mg/dL (ref 8–23)
CO2: 21 mmol/L — ABNORMAL LOW (ref 22–32)
Calcium: 8.9 mg/dL (ref 8.9–10.3)
Chloride: 105 mmol/L (ref 98–111)
Creatinine, Ser: 1.05 mg/dL (ref 0.61–1.24)
GFR, Estimated: >60 mL/min (ref >60)
Glucose, Bld: 113 mg/dL — ABNORMAL HIGH (ref 70–99)
Potassium: 3.2 mmol/L — ABNORMAL LOW (ref 3.5–5.1)
Sodium: 140 mmol/L (ref 135–145)
Total Bilirubin: 1 mg/dL (ref <1.2)
Total Protein: 5.8 g/dL — ABNORMAL LOW (ref 6.5–8.1)

## 2023-09-30 LAB — I-STAT CHEM 8, ED
BUN: 17 mg/dL (ref 8–23)
Calcium, Ion: 1.05 mmol/L — ABNORMAL LOW (ref 1.15–1.40)
Chloride: 106 mmol/L (ref 98–111)
Creatinine, Ser: 1 mg/dL (ref 0.61–1.24)
Glucose, Bld: 108 mg/dL — ABNORMAL HIGH (ref 70–99)
HCT: 21 % — ABNORMAL LOW (ref 39.0–52.0)
Hemoglobin: 7.1 g/dL — ABNORMAL LOW (ref 13.0–17.0)
Potassium: 3.1 mmol/L — ABNORMAL LOW (ref 3.5–5.1)
Sodium: 139 mmol/L (ref 135–145)
TCO2: 21 mmol/L — ABNORMAL LOW (ref 22–32)

## 2023-09-30 LAB — CK: Total CK: 288 U/L (ref 49–397)

## 2023-09-30 LAB — CBC
HCT: 24.4 % — ABNORMAL LOW (ref 39.0–52.0)
HCT: 22.7 % — ABNORMAL LOW (ref 39.0–52.0)
HCT: 23.8 % — ABNORMAL LOW (ref 39.0–52.0)
Hemoglobin: 8 g/dL — ABNORMAL LOW (ref 13.0–17.0)
Hemoglobin: 7.5 g/dL — ABNORMAL LOW (ref 13.0–17.0)
Hemoglobin: 7.9 g/dL — ABNORMAL LOW (ref 13.0–17.0)
MCH: 32.5 pg (ref 26.0–34.0)
MCH: 32.6 pg (ref 26.0–34.0)
MCH: 32.2 pg (ref 26.0–34.0)
MCHC: 32.8 g/dL (ref 30.0–36.0)
MCHC: 33 g/dL (ref 30.0–36.0)
MCHC: 33.2 g/dL (ref 30.0–36.0)
MCV: 99.2 fL (ref 80.0–100.0)
MCV: 98.7 fL (ref 80.0–100.0)
MCV: 97.1 fL (ref 80.0–100.0)
Platelets: 169 10*3/uL (ref 150–400)
Platelets: 143 10*3/uL — ABNORMAL LOW (ref 150–400)
Platelets: 142 10*3/uL — ABNORMAL LOW (ref 150–400)
RBC: 2.46 MIL/uL — ABNORMAL LOW (ref 4.22–5.81)
RBC: 2.3 MIL/uL — ABNORMAL LOW (ref 4.22–5.81)
RBC: 2.45 MIL/uL — ABNORMAL LOW (ref 4.22–5.81)
RDW: 15.1 % (ref 11.5–15.5)
RDW: 15.1 % (ref 11.5–15.5)
RDW: 15.4 % (ref 11.5–15.5)
WBC: 6.8 10*3/uL (ref 4.0–10.5)
WBC: 5.8 10*3/uL (ref 4.0–10.5)
WBC: 5.5 10*3/uL (ref 4.0–10.5)
nRBC: 0 % (ref 0.0–0.2)
nRBC: 0 % (ref 0.0–0.2)
nRBC: 0 % (ref 0.0–0.2)

## 2023-09-30 LAB — IRON AND TIBC
Iron: 43 ug/dL — ABNORMAL LOW (ref 45–182)
Saturation Ratios: 14 % — ABNORMAL LOW (ref 17.9–39.5)
TIBC: 309 ug/dL (ref 250–450)
UIBC: 266 ug/dL

## 2023-09-30 LAB — CBG MONITORING, ED
Glucose-Capillary: 104 mg/dL — ABNORMAL HIGH (ref 70–99)
Glucose-Capillary: 92 mg/dL (ref 70–99)

## 2023-09-30 LAB — RETICULOCYTES
Immature Retic Fract: 31.5 % — ABNORMAL HIGH (ref 2.3–15.9)
RBC.: 2.2 MIL/uL — ABNORMAL LOW (ref 4.22–5.81)
Retic Count, Absolute: 56.5 10*3/uL (ref 19.0–186.0)
Retic Ct Pct: 2.6 % (ref 0.4–3.1)

## 2023-09-30 LAB — ETHANOL: Alcohol, Ethyl (B): 30 mg/dL — ABNORMAL HIGH (ref <10)

## 2023-09-30 LAB — MAGNESIUM: Magnesium: 2 mg/dL (ref 1.7–2.4)

## 2023-09-30 LAB — LACTIC ACID, PLASMA: Lactic Acid, Venous: 0.9 mmol/L (ref 0.5–1.9)

## 2023-09-30 LAB — I-STAT CG4 LACTIC ACID, ED: Lactic Acid, Venous: 4.1 mmol/L (ref 0.5–1.9)

## 2023-09-30 LAB — FERRITIN: Ferritin: 31 ng/mL (ref 24–336)

## 2023-09-30 LAB — GLUCOSE, CAPILLARY: Glucose-Capillary: 114 mg/dL — ABNORMAL HIGH (ref 70–99)

## 2023-09-30 LAB — PROTIME-INR
INR: 1.3 — ABNORMAL HIGH (ref 0.8–1.2)
Prothrombin Time: 16.1 s — ABNORMAL HIGH (ref 11.4–15.2)

## 2023-09-30 LAB — ABO/RH: ABO/RH(D): O POS

## 2023-09-30 LAB — PREPARE RBC (CROSSMATCH)

## 2023-09-30 LAB — FOLATE: Folate: 26.1 ng/mL (ref >5.9)

## 2023-09-30 LAB — SAMPLE TO BLOOD BANK

## 2023-09-30 LAB — VITAMIN B12: Vitamin B-12: 400 pg/mL (ref 180–914)

## 2023-09-30 MED ORDER — INSULIN ASPART 100 UNIT/ML IJ SOLN
0.0000 [IU] | Freq: Three times a day (TID) | INTRAMUSCULAR | Status: DC
  Administered 2023-10-03 (×2): 1 [IU] via SUBCUTANEOUS
  Administered 2023-10-03: 2 [IU] via SUBCUTANEOUS
  Administered 2023-10-04: 1 [IU] via SUBCUTANEOUS
  Administered 2023-10-04 (×2): 2 [IU] via SUBCUTANEOUS
  Administered 2023-10-05: 1 [IU] via SUBCUTANEOUS
  Administered 2023-10-05 – 2023-10-06 (×2): 2 [IU] via SUBCUTANEOUS
  Administered 2023-10-06: 1 [IU] via SUBCUTANEOUS
  Administered 2023-10-06: 3 [IU] via SUBCUTANEOUS
  Administered 2023-10-07 – 2023-10-08 (×3): 1 [IU] via SUBCUTANEOUS
  Administered 2023-10-08: 2 [IU] via SUBCUTANEOUS
  Administered 2023-10-09: 1 [IU] via SUBCUTANEOUS
  Administered 2023-10-09 – 2023-10-10 (×3): 2 [IU] via SUBCUTANEOUS

## 2023-09-30 MED ORDER — LORAZEPAM 2 MG/ML IJ SOLN: 0.0000 mg | Freq: Two times a day (BID) | INTRAMUSCULAR | Status: AC

## 2023-09-30 MED ORDER — POTASSIUM CHLORIDE CRYS ER 20 MEQ PO TBCR
40.0000 meq | EXTENDED_RELEASE_TABLET | Freq: Once | ORAL | Status: DC
  Filled 2023-09-30: qty 2

## 2023-09-30 MED ORDER — THIAMINE MONONITRATE 100 MG PO TABS: 100.0000 mg | ORAL_TABLET | Freq: Every day | ORAL | Status: DC

## 2023-09-30 MED ORDER — ONDANSETRON HCL 4 MG PO TABS: 4.0000 mg | ORAL_TABLET | Freq: Four times a day (QID) | ORAL | Status: DC | PRN

## 2023-09-30 MED ORDER — AMIODARONE HCL 200 MG PO TABS
200.0000 mg | ORAL_TABLET | Freq: Every day | ORAL | Status: DC
Start: 2023-09-30 — End: 2023-10-01
  Administered 2023-09-30: 200 mg via ORAL
  Filled 2023-09-30 (×2): qty 1

## 2023-09-30 MED ORDER — PANTOPRAZOLE SODIUM 40 MG IV SOLR
40.0000 mg | INTRAVENOUS | Status: DC
  Administered 2023-09-30 – 2023-10-03 (×4): 40 mg via INTRAVENOUS
  Filled 2023-09-30 (×4): qty 10

## 2023-09-30 MED ORDER — LORAZEPAM 2 MG/ML IJ SOLN: 0.0000 mg | Freq: Four times a day (QID) | INTRAMUSCULAR | Status: AC

## 2023-09-30 MED ORDER — THIAMINE HCL 100 MG/ML IJ SOLN
100.0000 mg | Freq: Every day | INTRAMUSCULAR | Status: DC
  Administered 2023-09-30: 100 mg via INTRAVENOUS
  Filled 2023-09-30: qty 2

## 2023-09-30 MED ORDER — ACETAMINOPHEN 325 MG PO TABS: 650.0000 mg | ORAL_TABLET | Freq: Four times a day (QID) | ORAL | Status: DC | PRN

## 2023-09-30 MED ORDER — MORPHINE SULFATE (PF) 2 MG/ML IV SOLN
2.0000 mg | INTRAVENOUS | Status: DC | PRN
  Administered 2023-10-01 – 2023-10-10 (×21): 2 mg via INTRAVENOUS
  Filled 2023-09-30 (×21): qty 1

## 2023-09-30 MED ORDER — FOLIC ACID 1 MG PO TABS
1.0000 mg | ORAL_TABLET | Freq: Every day | ORAL | Status: DC
  Administered 2023-09-30 – 2023-10-10 (×10): 1 mg via ORAL
  Filled 2023-09-30 (×12): qty 1

## 2023-09-30 MED ORDER — HYDROCODONE-ACETAMINOPHEN 5-325 MG PO TABS
1.0000 | ORAL_TABLET | ORAL | Status: DC | PRN
  Administered 2023-09-30 – 2023-10-09 (×6): 1 via ORAL
  Filled 2023-09-30 (×8): qty 1

## 2023-09-30 MED ORDER — SODIUM CHLORIDE 0.9 % IV SOLN: INTRAVENOUS | Status: AC

## 2023-09-30 MED ORDER — LORAZEPAM 2 MG/ML IJ SOLN: 1.0000 mg | INTRAMUSCULAR | Status: DC | PRN

## 2023-09-30 MED ORDER — ONDANSETRON HCL 4 MG/2ML IJ SOLN: 4.0000 mg | Freq: Four times a day (QID) | INTRAMUSCULAR | Status: DC | PRN

## 2023-09-30 MED ORDER — LIDOCAINE-EPINEPHRINE-TETRACAINE (LET) TOPICAL GEL
3.0000 mL | Freq: Once | TOPICAL | Status: AC
  Administered 2023-09-30: 3 mL via TOPICAL
  Filled 2023-09-30: qty 3

## 2023-09-30 MED ORDER — ACETAMINOPHEN 650 MG RE SUPP: 650.0000 mg | Freq: Four times a day (QID) | RECTAL | Status: DC | PRN

## 2023-09-30 MED ORDER — ADULT MULTIVITAMIN W/MINERALS CH
1.0000 | ORAL_TABLET | Freq: Every day | ORAL | Status: DC
  Administered 2023-09-30 – 2023-10-10 (×10): 1 via ORAL
  Filled 2023-09-30 (×12): qty 1

## 2023-09-30 MED ORDER — LORAZEPAM 1 MG PO TABS: 1.0000 mg | ORAL_TABLET | ORAL | Status: DC | PRN

## 2023-09-30 MED ORDER — IOHEXOL 350 MG/ML SOLN
75.0000 mL | Freq: Once | INTRAVENOUS | Status: AC | PRN
  Administered 2023-09-30: 75 mL via INTRAVENOUS

## 2023-09-30 MED ORDER — SODIUM CHLORIDE 0.9% IV SOLUTION: Freq: Once | INTRAVENOUS | Status: DC

## 2023-09-30 NOTE — ED Notes (Signed)
Trauma Response Nurse Documentation   Adam [name] is a 72 y.o. male arriving to [facility] ED via EMS  On Eliquis (apixaban) daily. Trauma was activated as a Level 1 by ED charge RN based on the following trauma criteria Anytime Systolic Blood Pressure < 90.  Patient cleared for CT by Dr. Cliffton Asters trauma MD. Pt transported to CT with trauma response nurse present to monitor. RN remained with the patient throughout their absence from the department for clinical observation.   GCS 11 (E4V1M6) which is reportedly patient's baseline.  Trauma MD Arrival Time: 0125  History   History reviewed. No pertinent past medical history.   History reviewed. No pertinent surgical history.     Initial Focused Assessment (If applicable, or please see trauma documentation): Nonverbal male presents via EMS from home after a fall with head injury, lac right head. Nonverbal at baseline, communicating with hand gestures. Recent fall, seen in the ED two days ago and had staples placed.  Airway patent/unobstructed, BS clear Bleeding to head lac controlled, initial hypotension in the field resolved on arrival to ED GCS 11 PERRLA 3  CT's Completed:   CT Head, CT C-Spine, CT Chest w/ contrast, and CT abdomen/pelvis w/ contrast   Interventions:  IV start and trauma lab draw [place] J collar (no EMS collar) Portable chest, pelvis, left femur, left knee XRAY CT head, c-spine, CAP  Plan for disposition:  Admission to floor   Consults completed:  Trauma MD White paged at 0112, to bedside 0125.  Event Summary: Presents via EMS after a fall with right head lac. Body covered in bruising of various stages of healing.  MTP Summary (If applicable): NA  Bedside handoff with ED RN Adam [name].    Adam [name]  Trauma Response RN  Please call TRN at [phone] for further assistance.

## 2023-09-30 NOTE — Assessment & Plan Note (Signed)
Very high risk of withdrawal  In progressive Drinks all day scotch and liqour, wife states he has had withdrawal before Continue CIWA protocol

## 2023-09-30 NOTE — Assessment & Plan Note (Signed)
4.3 on CT today, followed annually by cardiology  Stable

## 2023-09-30 NOTE — Assessment & Plan Note (Signed)
Continue cymbalta  

## 2023-09-30 NOTE — Assessment & Plan Note (Addendum)
NSR s/p ablation ILR in situ  On eliquis; however, he has frequent falls with numerous ER visits for falls  Discussed with wife bleed risk with the falls She would be interested in stopping  Will hold today to make sure hgb stable  Will need discussion with PCP/cardiology/palliative care  Continue amiodarone Hold beta blocker with hypotension

## 2023-09-30 NOTE — Progress Notes (Signed)
CSW added substance abuse resources to patient's AVS.  Burnham Trost, MSW, LCSW Transitions of Care  Clinical Social Worker II [phone]  

## 2023-09-30 NOTE — Assessment & Plan Note (Signed)
A1C of 5.9 in 08/2023 Sensitive SSI and accuchecks QAC/HS  Hold synjardy XR

## 2023-09-30 NOTE — Assessment & Plan Note (Signed)
No signs/symptoms of flair  Continue home medications

## 2023-09-30 NOTE — ED Notes (Signed)
Pt stated he can swallow pills whole in ice cream or applesauce.

## 2023-09-30 NOTE — H&P (Signed)
History and Physical    Patient: Adam [name] QMV:[id number] DOB: 1950-10-14 DOA: 09/30/2023 DOS: the patient was seen and examined on 09/30/2023 PCP: Kristian Covey, MD  Patient coming from: Home - lives with his wife. Uses walker.    Chief Complaint: fall with head injury on eliquis   HPI: Adam [name] is a 72 y.o. male with medical history significant of PAF on eliquis, HTN, HLD, CAD, T2DM, thoracic AA, OSA, alcohol abuse with hx of wernicke encephalopathy and aphasia, pseudobulbar palsy, non verbal state who presented to ED after a fall at home with head injury. He was hypotensive per EMS to 80s on arrival and improved with 500cc bolus to SBP >100. He was drinking. Able to write his left leg gave out and he fell down and hit his head. NO chest pain, doesn't recall being dizzy or lightheaded. I called his wife and she is not sure what happened as she was already in bed.  She heard a crash and found him on the ground bleeding and she called 911. He falls quite frequently.   Able to point to his head and left leg when I asked if he is hurting. Can thumbs up or down ROS.    Denies any fever/chills, chest pain or palpitations, shortness of breath or cough, abdominal pain, N/V/D, dysuria or leg swelling.    He denies smoking, drinks all day long. Scotch or vodka. Starts in the morning.   ER Course:  vitals: afebrile, bp: 125/67, HR: 86, RR: 13, oxygen: 99%RA Pertinent labs: hgb: 8.0, potassium: 3.2, AST: 49, ethanol 30, lactic acid: 4.1>.9,  CXR: no acute finding  Pelvis xray:  no acute finding CT head: no acute finding. Stable prominence of lateral ventricles may be related to central predominant atrophy, although a component of NPH cannot be excluded.  CT cervical spine: no acute findings.  CT abdomen/pelvis: 1. Right hip subcutaneus soft tissue hematoma formation. 2. No acute intrathoracic, intra-abdominal, intrapelvic traumatic injury. 3. No acute fracture or traumatic  malalignment of the thoracic or lumbar spine. -cholelithiasis with no CT evidence of acute cholecystitis.  -stable ascending thoracic aorta (4.3cm)  In ED: started on CIWA protocol, right cheek laceration repaired with 1 suture (nylon)   Review of Systems: unable to review all systems due to the inability of the patient to answer questions. History reviewed. No pertinent past medical history. History reviewed. No pertinent surgical history. Social History:  reports that he has never smoked. He has never used smokeless tobacco. No history on file for alcohol use and drug use.  No Known Allergies  History reviewed. No pertinent family history.  Prior to Admission medications   Medication Sig Start Date End Date Taking? Authorizing Provider  acetaminophen (TYLENOL) 500 MG tablet Take 500 mg by mouth every 6 (six) hours as needed for mild pain (pain score 1-3) or moderate pain (pain score 4-6).   Yes [provider]  allopurinol (ZYLOPRIM) 300 MG tablet Take 300 mg by mouth daily. 09/10/23  Yes [provider]  amiodarone (PACERONE) 200 MG tablet Take 200 mg by mouth daily. 09/13/23  Yes [provider]  cyanocobalamin (VITAMIN B12) 500 MCG tablet Take 500 mcg by mouth daily.   Yes [provider]  DULoxetine (CYMBALTA) 60 MG capsule Take 60 mg by mouth daily. 08/22/23  Yes [provider]  ELIQUIS 5 MG TABS tablet Take 5 mg by mouth 2 (two) times daily. 08/05/23  Yes [provider]  gabapentin (NEURONTIN)  300 MG capsule Take 600 mg by mouth at bedtime as needed. 08/29/23  Yes [provider]  ipratropium (ATROVENT) 0.03 % nasal spray Place 2 sprays into both nostrils 3 (three) times daily. 08/29/23  Yes [provider]  lisinopril (ZESTRIL) 20 MG tablet Take 20 mg by mouth daily. 09/29/23  Yes [provider]  metoprolol tartrate (LOPRESSOR) 50 MG tablet Take 50 mg by mouth 2 (two) times daily. 08/22/23  Yes  [provider]  NUEDEXTA 20-10 MG capsule Take 1 capsule by mouth daily. 08/31/23  Yes [provider]  PREVIDENT 5000 ENAMEL PROTECT 1.1-5 % GEL USE TO BRUSH TEETH FOR 2 MINUTES TWICE DAILY THEN SPIT OUT AFTER. NO FOOD OR DRINK FOR 30 MINUTES AFTER 04/11/23  Yes [provider]  rosuvastatin (CRESTOR) 40 MG tablet Take 40 mg by mouth daily. 09/10/23  Yes [provider]  SKYRIZI PEN 150 MG/ML pen Inject into the skin. 08/23/23  Yes [provider]  SYNJARDY XR 25-1000 MG TB24 Take 1 tablet by mouth daily. 09/12/23  Yes [provider]    Physical Exam: Vitals:   09/30/23 1145 09/30/23 1200 09/30/23 1204 09/30/23 1221  BP: (!) 88/63 (!) 93/55  (!) 102/59  Pulse: 78 77  80  Resp: 13 14  16   Temp:   97.7 F (36.5 C) (!) 97.5 F (36.4 C)  TempSrc:   Axillary Axillary  SpO2: 100% 100%  98%  Weight:      Height:       General:  Appears calm and comfortable and is in NA. Large bruise and edema to right eye.  Eyes:  PERRL, EOMI, normal lids, iris ENT:  grossly normal hearing, lips & tongue, mmm; appropriate dentition Neck:  no LAD, masses or thyromegaly; no carotid bruits Cardiovascular:  RRR, no m/r/g. No LE edema.  Respiratory:   CTA bilaterally with no wheezes/rales/rhonchi.  Normal respiratory effort. Abdomen:  soft, NT, ND, NABS Back:   normal alignment, no CVAT Skin:  no rash or induration seen on limited exam. Large hematoma of left thigh. Ttp. Multiple bruises and small lacs. Large, indurated hematoma of right hip  Musculoskeletal:  grossly normal tone BUE/BLE, good ROM, no bony abnormality Lower extremity:  No LE edema.  Limited foot exam with no ulcerations.  2+ distal pulses. Psychiatric: unable to talk. Uses thumbs up and down. Can write. appropriate, AOx3 Neurologic:  CN 2-12 grossly intact, moves all extremities in coordinated fashion, sensation intact   Radiological Exams on Admission: Independently reviewed - see  discussion in A/P where applicable  DG Femur Min 2 Views Left Result Date: 09/30/2023 CLINICAL DATA:  Level 1 trauma patient, fall injury. EXAM: LEFT FEMUR 2 VIEWS; LEFT KNEE - COMPLETE 4+ VIEW COMPARISON:  Left femoral and left knee series from 3 days ago 09/27/2023 FINDINGS: AP and lateral left femur: Left hip arthroplasty with cemented femoral component without visible evidence of loosening. There is osteopenia without evidence for fractures or other focal bone lesions. No focal soft tissue swelling is seen. There is artifact from overlying clothing. There is contrast in the bladder and the distal left ureter. There is heavy vascular calcification. Left knee, four views: Small suprapatellar bursal effusion is unchanged. There is osteopenia without evidence of fracture or dislocation. Tricompartmental joint narrowing and osteophytosis is seen, greatest narrowing in the medial femorotibial joint Superficial soft tissues are unremarkable. The popliteal artery and trifurcation arteries are heavily calcified. IMPRESSION: 1. No evidence of fractures or other new findings  in the left femur or left knee. Small suprapatellar bursal effusion is unchanged. 2. Osteopenia and degenerative change. 3. Heavy vascular calcification. Electronically Signed   By: Almira Bar M.D.   On: 09/30/2023 02:45   DG Knee Complete 4 Views Left Result Date: 09/30/2023 CLINICAL DATA:  Level 1 trauma patient, fall injury. EXAM: LEFT FEMUR 2 VIEWS; LEFT KNEE - COMPLETE 4+ VIEW COMPARISON:  Left femoral and left knee series from 3 days ago 09/27/2023 FINDINGS: AP and lateral left femur: Left hip arthroplasty with cemented femoral component without visible evidence of loosening. There is osteopenia without evidence for fractures or other focal bone lesions. No focal soft tissue swelling is seen. There is artifact from overlying clothing. There is contrast in the bladder and the distal left ureter. There is heavy vascular calcification.  Left knee, four views: Small suprapatellar bursal effusion is unchanged. There is osteopenia without evidence of fracture or dislocation. Tricompartmental joint narrowing and osteophytosis is seen, greatest narrowing in the medial femorotibial joint Superficial soft tissues are unremarkable. The popliteal artery and trifurcation arteries are heavily calcified. IMPRESSION: 1. No evidence of fractures or other new findings in the left femur or left knee. Small suprapatellar bursal effusion is unchanged. 2. Osteopenia and degenerative change. 3. Heavy vascular calcification. Electronically Signed   By: Almira Bar M.D.   On: 09/30/2023 02:45   CT CHEST ABDOMEN PELVIS W CONTRAST Result Date: 09/30/2023 CLINICAL DATA:  Polytrauma, blunt EXAM: CT CHEST, ABDOMEN, AND PELVIS WITH CONTRAST TECHNIQUE: Multidetector CT imaging of the chest, abdomen and pelvis was performed following the standard protocol during bolus administration of intravenous contrast. RADIATION DOSE REDUCTION: This exam was performed according to the departmental dose-optimization program which includes automated exposure control, adjustment of the mA and/or kV according to patient size and/or use of iterative reconstruction technique. CONTRAST:  75mL OMNIPAQUE IOHEXOL 350 MG/ML SOLN COMPARISON:  CT angio chest 06/17/2022, ultrasound renal 02/12/2023, x-ray pelvis 09/30/2023 FINDINGS: CHEST: Cardiovascular: No aortic injury. 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 Four-vessel coronary calcification. Status post coronary bypass graft and stents. The heart is normal in size. No significant pericardial effusion. Mediastinum/Nodes: No pneumomediastinum. No mediastinal hematoma. The esophagus is unremarkable. The thyroid is unremarkable. The central airways are patent. No mediastinal, hilar, or axillary lymphadenopathy. Lungs/Pleura: No focal consolidation. No pulmonary nodule. No pulmonary mass. No pulmonary contusion or  laceration. No pneumatocele formation. Chronic right rib fractures. No pleural effusion. No pneumothorax. No hemothorax. Musculoskeletal/Chest wall: No chest wall mass. No acute rib or sternal fracture. No spinal fracture. Multilevel degenerative changes of the spine. Multiple chronically fractured sternotomy wires. ABDOMEN / PELVIS: Hepatobiliary: Not enlarged. No focal lesion. No laceration or subcapsular hematoma. Calcified gallstones within the gallbladder lumen. No gallbladder wall thickening or pericholecystic fluid. No biliary ductal dilatation. Pancreas: Normal pancreatic contour. No main pancreatic duct dilatation. Spleen: Not enlarged. No focal lesion. No laceration, subcapsular hematoma, or vascular injury. Adrenals/Urinary Tract: No nodularity bilaterally. Bilateral kidneys enhance symmetrically. No hydronephrosis. No contusion, laceration, or subcapsular hematoma. No injury to the vascular structures or collecting systems. No hydroureter. The urinary bladder is unremarkable. Stomach/Bowel: No small or large bowel wall thickening or dilatation. The appendix is not definitely identified with no inflammatory changes in the right lower quadrant to suggest acute appendicitis. Vasculature/Lymphatics: No abdominal aorta or iliac aneurysm. No active contrast extravasation or pseudoaneurysm. No abdominal, pelvic, inguinal lymphadenopathy. Reproductive: Normal. Other: No simple free fluid ascites. No pneumoperitoneum. No hemoperitoneum. No mesenteric hematoma identified. No  organized fluid collection. Musculoskeletal: Right hip subcutaneus soft tissue hematoma formation. No acute pelvic fracture. No spinal fracture. Multilevel severe degenerative changes of the spine. 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 Partially visualized total bilateral hip arthroplasty. Ports and Devices: None. IMPRESSION: 1. Right hip subcutaneus soft tissue hematoma formation. 2.  No acute intrathoracic, intra-abdominal, intrapelvic traumatic injury. 3. No acute fracture or traumatic malalignment of the thoracic or lumbar spine. Other imaging findings of potential clinical significance: 1. Cholelithiasis with no CT evidence of acute cholecystitis. 2. Stable ascending thoracic aorta (4.3 cm). Recommend annual imaging followup by CTA or MRA. This recommendation follows 2010 ACCF/AHA/AATS/ACR/ASA/SCA/SCAI/SIR/STS/SVM Guidelines for the Diagnosis and Management of Patients with Thoracic Aortic Disease. Circulation. 2010; 121: N829-F621. Aortic aneurysm NOS (ICD10-I71.9). These results were called by telephone at the time of interpretation on 09/30/2023 at 1:45 am to provider Dr. Cliffton Asters, Who verbally acknowledged these results. Electronically Signed   By: Tish Frederickson M.D.   On: 09/30/2023 02:00   CT HEAD WO CONTRAST Result Date: 09/30/2023 CLINICAL DATA:  Head trauma, moderate-severe; Polytrauma, blunt EXAM: CT HEAD WITHOUT CONTRAST CT CERVICAL SPINE WITHOUT CONTRAST TECHNIQUE: Multidetector CT imaging of the head and cervical spine was performed following the standard protocol without intravenous contrast. Multiplanar CT image reconstructions of the cervical spine were also generated. RADIATION DOSE REDUCTION: This exam was performed according to the departmental dose-optimization program which includes automated exposure control, adjustment of the mA and/or kV according to patient size and/or use of iterative reconstruction technique. COMPARISON:  CT head and C-spine 09/27/2023 FINDINGS: CT HEAD FINDINGS Brain: Stable prominence of the lateral ventricles may be related to central predominant atrophy, although a component of normal pressure/communicating hydrocephalus cannot be excluded. Patchy and confluent areas of decreased attenuation are noted throughout the deep and periventricular white matter of the cerebral hemispheres bilaterally, compatible with chronic microvascular ischemic  disease. No evidence of large-territorial acute infarction. No parenchymal hemorrhage. No mass lesion. No extra-axial collection. No mass effect or midline shift. No hydrocephalus. Basilar cisterns are patent. Vascular: No hyperdense vessel. Skull: No acute fracture or focal lesion. Sinuses/Orbits: Left maxillary sinus mucosal thickening. Paranasal sinuses and mastoid air cells are clear. The orbits are unremarkable. Other: None. CT CERVICAL SPINE FINDINGS Alignment: Normal. Skull base and vertebrae: Multilevel degenerative changes spine most prominent at the C5 through C7 levels. No associated severe osseous neural foraminal or central canal stenosis. No acute fracture. No aggressive appearing focal osseous lesion or focal pathologic process. Soft tissues and spinal canal: No prevertebral fluid or swelling. No visible canal hematoma. Upper chest: Unremarkable. Other: Atherosclerotic plaque of the carotid arteries within the neck. IMPRESSION: 1. No acute intracranial abnormality. 2. No acute displaced fracture or traumatic listhesis of the cervical spine. 3. Stable prominence of the lateral ventricles may be related to central predominant atrophy, although a component of normal pressure/communicating hydrocephalus cannot be excluded. These results were called by telephone at the time of interpretation on 09/30/2023 at 1:45 am to provider Dr. Cliffton Asters, Who verbally acknowledged these results. Electronically Signed   By: Tish Frederickson M.D.   On: 09/30/2023 01:53   CT CERVICAL SPINE WO CONTRAST Result Date: 09/30/2023 CLINICAL DATA:  Head trauma, moderate-severe; Polytrauma, blunt EXAM: CT HEAD WITHOUT CONTRAST CT CERVICAL SPINE WITHOUT CONTRAST TECHNIQUE: Multidetector CT imaging of the head and cervical spine was performed following the standard protocol without intravenous contrast. Multiplanar CT image reconstructions of the cervical spine were also generated. RADIATION DOSE  REDUCTION: This exam was performed  according to the departmental dose-optimization program which includes automated exposure control, adjustment of the mA and/or kV according to patient size and/or use of iterative reconstruction technique. COMPARISON:  CT head and C-spine 09/27/2023 FINDINGS: CT HEAD FINDINGS Brain: Stable prominence of the lateral ventricles may be related to central predominant atrophy, although a component of normal pressure/communicating hydrocephalus cannot be excluded. Patchy and confluent areas of decreased attenuation are noted throughout the deep and periventricular white matter of the cerebral hemispheres bilaterally, compatible with chronic microvascular ischemic disease. No evidence of large-territorial acute infarction. No parenchymal hemorrhage. No mass lesion. No extra-axial collection. No mass effect or midline shift. No hydrocephalus. Basilar cisterns are patent. Vascular: No hyperdense vessel. Skull: No acute fracture or focal lesion. Sinuses/Orbits: Left maxillary sinus mucosal thickening. Paranasal sinuses and mastoid air cells are clear. The orbits are unremarkable. Other: None. CT CERVICAL SPINE FINDINGS Alignment: Normal. Skull base and vertebrae: Multilevel degenerative changes spine most prominent at the C5 through C7 levels. No associated severe osseous neural foraminal or central canal stenosis. No acute fracture. No aggressive appearing focal osseous lesion or focal pathologic process. Soft tissues and spinal canal: No prevertebral fluid or swelling. No visible canal hematoma. Upper chest: Unremarkable. Other: Atherosclerotic plaque of the carotid arteries within the neck. IMPRESSION: 1. No acute intracranial abnormality. 2. No acute displaced fracture or traumatic listhesis of the cervical spine. 3. Stable prominence of the lateral ventricles may be related to central predominant atrophy, although a component of normal pressure/communicating hydrocephalus cannot be excluded. These results were called by  telephone at the time of interpretation on 09/30/2023 at 1:45 am to provider Dr. Cliffton Asters, Who verbally acknowledged these results. Electronically Signed   By: Tish Frederickson M.D.   On: 09/30/2023 01:53   DG Pelvis Portable Result Date: 09/30/2023 CLINICAL DATA:  Trauma EXAM: PORTABLE PELVIS 1-2 VIEWS COMPARISON:  CT abdomen pelvis 09/30/2023, x-ray left femur 09/27/2023 FINDINGS: Bilateral total hip arthroplasty partially visualized. No radiographic findings suggest surgical hardware complication. There is no evidence of pelvic fracture or diastasis. No pelvic bone lesions are seen. Vascular calcification. IMPRESSION: Negative for acute traumatic injury. Electronically Signed   By: Tish Frederickson M.D.   On: 09/30/2023 01:49   DG Chest Port 1 View Result Date: 09/30/2023 CLINICAL DATA:  Trauma EXAM: PORTABLE CHEST 1 VIEW COMPARISON:  CT chest 09/30/2023. chest x-ray 09/27/2023 FINDINGS: Wireless cardiac device overlies the left chest. Coronary artery stents. The heart and mediastinal contours are unchanged. No focal consolidation. No pulmonary edema. No pleural effusion. No pneumothorax. No acute osseous abnormality.  Chronic fractured sternotomy wires. IMPRESSION: No active disease. These results were called by telephone at the time of interpretation on 09/30/2023 at 1:45 am to provider Dr. Cliffton Asters, Who verbally acknowledged these results. Electronically Signed   By: Tish Frederickson M.D.   On: 09/30/2023 01:49    EKG: pending    Labs on Admission: I have personally reviewed the available labs and imaging studies at the time of the admission.  Pertinent labs:   hgb: 8.0>7.4   potassium: 3.2,  AST: 49,  ethanol 30,  lactic acid: 4.1>.9,  Assessment and Plan: Principal Problem:   Acute on chronic anemia Active Problems:   Fall at home, initial encounter   Alcohol abuse   Hypokalemia   Dysphagia   PAF (paroxysmal atrial fibrillation) (HCC)   progressive neurologica disorder with  pseudobulbar palsy, aphasia, wernicke encephalopathy with continued alcohol use   Essential hypertension  CAD (coronary artery disease)   Controlled type 2 diabetes mellitus without complication, without long-term current use of insulin (HCC)   COPD with asthma (HCC)   Thoracic aortic aneurysm (HCC)   Hyperlipidemia   Depression   OSA (obstructive sleep apnea)   Wernicke encephalopathy    Assessment and Plan: * Acute on chronic anemia 72 year old male presenting to ED after unwitnessed fall with head injury on eliquis in setting of alcohol use and progressive neurological disorder with pseudobulbar palsy, aphasia and wernickes encephalopathy found to have acute on chronic anemia down to hgb 7.1  -admit to progressive  -baseline hgb appears to be around 11-12, but needed 2 units back in spring for anemia -will give one unit PRBC now with  hgb less than 8  in setting of CAD and fall and orthostasis  -likely due to large hematoma  -start PPI IV with drinking history  -check fecal occult at risk for GI bleed in setting of alcohol abuse, unsure about dark stools  -trend CBC  -iron studies  -check orthostatic vitals  -hold eliquis for today   Fall at home, initial encounter Unwitnessed fall in setting of alcohol abuse/wernicke encephalopathy  Head CT with no acute findings on eliquis  Neurology had wanted MRI, has ILR in place. Consider MRI  Denies any chest pain, EKG pending. Check troponin Hgb low, check orthostatics. Transfuse 1 unit PRBC  Frequent falls, wife can not care for him: SW/PT consult  Fall precautions  Holding eliquis, would stop permanently  1 nylon suture placed in ED to right cheek   Hypokalemia Check magnesium Replete and trend   Alcohol abuse Very high risk of withdrawal  In progressive Drinks all day scotch and liqour, wife states he has had withdrawal before Continue CIWA protocol   Dysphagia Likely secondary to # 6 problem  Lost 100 pounds over last  1.5 years  MBSS attempted last month, but aborted as  he could not complete Many discussions about G tube, but he has been adamant that he does not want this.  NPO until SLP can see Has been able to swallow his pills with yogurt at home High risk of aspiration, precautions given  Palliative care consult   PAF (paroxysmal atrial fibrillation) (HCC) NSR s/p ablation ILR in situ  On eliquis; however, he has frequent falls with numerous ER visits for falls  Discussed with wife bleed risk with the falls She would be interested in stopping  Will hold today to make sure hgb stable  Will need discussion with PCP/cardiology/palliative care  Continue amiodarone Hold beta blocker with hypotension   progressive neurologica disorder with pseudobulbar palsy, aphasia, wernicke encephalopathy with continued alcohol use -followed by neurology, recently prescribed  Nuedexta in November of this year  -continues to drink, worsening dysphagia: palliative care  -consider MRI brain, but has ILR in place -discussed GOC with wife. She can no longer provide care for him at home. SW consult placed -He has DNR order at home, but is telling me is a full code here. Again, GOC need to be discussed further, palliative consult pending   Essential hypertension Blood pressure have been soft Continue IVF Check orthostatic  Hold oral meds for now   CAD (coronary artery disease) multiple stent placements CABG x 6 in [place] in 2011 (LIMA to LAD, RIMA to diagonal, left radial artery to circumflex, SVG to PDA, SVG to PL SA, and questionable SVG  No chest pain Continue medical management.  EKG/troponin pending  Controlled type 2 diabetes mellitus without complication, without long-term current use of insulin (HCC) A1C of 5.9 in 08/2023 Sensitive SSI and accuchecks QAC/HS  Hold synjardy XR   COPD with asthma (HCC) No signs/symptoms of flair  Continue home medications   Thoracic aortic aneurysm (HCC) 4.3  on CT today, followed annually by cardiology  Stable   Hyperlipidemia Continue crestor   Depression Continue cymbalta     Advance Care Planning:   Code Status: Full Code   Consults: ST PT, palliative care, social work   DVT Prophylaxis: eliquis (hold)/TED hose/SCD   Family Communication: wife by phone   Severity of Illness: The appropriate patient status for this patient is INPATIENT. Inpatient status is judged to be reasonable and necessary in order to provide the required intensity of service to ensure the patient's safety. The patient's presenting symptoms, physical exam findings, and initial radiographic and laboratory data in the context of their chronic comorbidities is felt to place them at high risk for further clinical deterioration. Furthermore, it is not anticipated that the patient will be medically stable for discharge from the hospital within 2 midnights of admission.   * I certify that at the point of admission it is my clinical judgment that the patient will require inpatient hospital care spanning beyond 2 midnights from the point of admission due to high intensity of service, high risk for further deterioration and high frequency of surveillance required.*  Author: Orland Mustard, MD 09/30/2023 1:13 PM  For on call review [email or web address].

## 2023-09-30 NOTE — ED Notes (Signed)
Pt pulled off external condom catheter.   Fresh linens placed. New gown. Nonslip footwear placed.   Pt gives thumbs up for urinal and declines another condom catheter being placed.

## 2023-09-30 NOTE — Consult Note (Signed)
Activation and Reason: Level 1, fall on thinners  Primary Survey:  Airway: intact talking Breathing: bilateral bs Circulation: palpable pulses in ext Disability: GCS 15  HPI: Adam [name] is an 72 y.o. male with hx fall of HTN, HLD, afib (on xarelto), frequent falls including 09/27/23 - fell this evening on reported blood thinners. Medical hx not completely known at this time. Reportedly intoxicated. Reports some pain in his head currently, denies pain in neck, back, chest, abdomen/pelvis or any extremity. Gives thumbs up when asked.  History reviewed. No pertinent past medical history.  History reviewed. No pertinent surgical history.  History reviewed. No pertinent family history.  Social:  reports that he has never smoked. He has never used smokeless tobacco. No history on file for alcohol use and drug use.  Allergies: No Known Allergies  Medications: I have reviewed the patient's current medications.  Results for orders placed or performed during the hospital encounter of 09/30/23 (from the past 48 hours)  CBC     Status: Abnormal   Collection Time: 09/30/23  1:24 AM  Result Value Ref Range   WBC 6.8 4.0 - 10.5 K/uL   RBC 2.46 (L) 4.22 - 5.81 MIL/uL   Hemoglobin 8.0 (L) 13.0 - 17.0 g/dL   HCT 16.1 (L) 09.6 - 04.5 %   MCV 99.2 80.0 - 100.0 fL   MCH 32.5 26.0 - 34.0 pg   MCHC 32.8 30.0 - 36.0 g/dL   RDW 40.9 81.1 - 91.4 %   Platelets 169 150 - 400 K/uL   nRBC 0.0 0.0 - 0.2 %    Comment: Performed at [facility], [address], [place], [place] [address]  Sample to Blood Bank     Status: None   Collection Time: 09/30/23  1:24 AM  Result Value Ref Range   Blood Bank Specimen SAMPLE AVAILABLE FOR TESTING    Sample Expiration      10/03/2023,2359 Performed at [facility], [address], [place], [place] [address]   I-Stat Chem 8, ED     Status: Abnormal   Collection Time: 09/30/23  1:34 AM  Result Value Ref Range   Sodium 139 135 - 145 mmol/L    Potassium 3.1 (L) 3.5 - 5.1 mmol/L   Chloride 106 98 - 111 mmol/L   BUN 17 8 - 23 mg/dL   Creatinine, Ser 8.65 0.61 - 1.24 mg/dL   Glucose, Bld 784 (H) 70 - 99 mg/dL    Comment: Glucose reference range applies only to samples taken after fasting for at least 8 hours.   Calcium, Ion 1.05 (L) 1.15 - 1.40 mmol/L   TCO2 21 (L) 22 - 32 mmol/L   Hemoglobin 7.1 (L) 13.0 - 17.0 g/dL   HCT 69.6 (L) 29.5 - 28.4 %  I-Stat Lactic Acid, ED     Status: Abnormal   Collection Time: 09/30/23  1:34 AM  Result Value Ref Range   Lactic Acid, Venous 4.1 (HH) 0.5 - 1.9 mmol/L   Comment NOTIFIED PHYSICIAN     CT CHEST ABDOMEN PELVIS W CONTRAST Result Date: 09/30/2023 CLINICAL DATA:  Polytrauma, blunt EXAM: CT CHEST, ABDOMEN, AND PELVIS WITH CONTRAST TECHNIQUE: Multidetector CT imaging of the chest, abdomen and pelvis was performed following the standard protocol during bolus administration of intravenous contrast. RADIATION DOSE REDUCTION: This exam was performed according to the departmental dose-optimization program which includes automated exposure control, adjustment of the mA and/or kV according to patient size and/or use of  iterative reconstruction technique. CONTRAST:  75mL OMNIPAQUE IOHEXOL 350 MG/ML SOLN COMPARISON:  CT angio chest 06/17/2022, ultrasound renal 02/12/2023, x-ray pelvis 09/30/2023 FINDINGS: CHEST: Cardiovascular: No aortic injury. 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 Four-vessel coronary calcification. Status post coronary bypass graft and stents. The heart is normal in size. No significant pericardial effusion. Mediastinum/Nodes: No pneumomediastinum. No mediastinal hematoma. The esophagus is unremarkable. The thyroid is unremarkable. The central airways are patent. No mediastinal, hilar, or axillary lymphadenopathy. Lungs/Pleura: No focal consolidation. No pulmonary nodule. No pulmonary mass. No pulmonary contusion or laceration. No pneumatocele formation. Chronic  right rib fractures. No pleural effusion. No pneumothorax. No hemothorax. Musculoskeletal/Chest wall: No chest wall mass. No acute rib or sternal fracture. No spinal fracture. Multilevel degenerative changes of the spine. Multiple chronically fractured sternotomy wires. ABDOMEN / PELVIS: Hepatobiliary: Not enlarged. No focal lesion. No laceration or subcapsular hematoma. Calcified gallstones within the gallbladder lumen. No gallbladder wall thickening or pericholecystic fluid. No biliary ductal dilatation. Pancreas: Normal pancreatic contour. No main pancreatic duct dilatation. Spleen: Not enlarged. No focal lesion. No laceration, subcapsular hematoma, or vascular injury. Adrenals/Urinary Tract: No nodularity bilaterally. Bilateral kidneys enhance symmetrically. No hydronephrosis. No contusion, laceration, or subcapsular hematoma. No injury to the vascular structures or collecting systems. No hydroureter. The urinary bladder is unremarkable. Stomach/Bowel: No small or large bowel wall thickening or dilatation. The appendix is not definitely identified with no inflammatory changes in the right lower quadrant to suggest acute appendicitis. Vasculature/Lymphatics: No abdominal aorta or iliac aneurysm. No active contrast extravasation or pseudoaneurysm. No abdominal, pelvic, inguinal lymphadenopathy. Reproductive: Normal. Other: No simple free fluid ascites. No pneumoperitoneum. No hemoperitoneum. No mesenteric hematoma identified. No organized fluid collection. Musculoskeletal: Right hip subcutaneus soft tissue hematoma formation. No acute pelvic fracture. No spinal fracture. Multilevel severe degenerative changes of the spine. 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 Partially visualized total bilateral hip arthroplasty. Ports and Devices: None. IMPRESSION: 1. Right hip subcutaneus soft tissue hematoma formation. 2. No acute intrathoracic, intra-abdominal,  intrapelvic traumatic injury. 3. No acute fracture or traumatic malalignment of the thoracic or lumbar spine. Other imaging findings of potential clinical significance: 1. Cholelithiasis with no CT evidence of acute cholecystitis. 2. Stable ascending thoracic aorta (4.3 cm). Recommend annual imaging followup by CTA or MRA. This recommendation follows 2010 ACCF/AHA/AATS/ACR/ASA/SCA/SCAI/SIR/STS/SVM Guidelines for the Diagnosis and Management of Patients with Thoracic Aortic Disease. Circulation. 2010; 121: E332-R518. Aortic aneurysm NOS (ICD10-I71.9). These results were called by telephone at the time of interpretation on 09/30/2023 at 1:45 am to provider Dr. Cliffton Asters, Who verbally acknowledged these results. Electronically Signed   By: Tish Frederickson M.D.   On: 09/30/2023 02:00   CT HEAD WO CONTRAST Result Date: 09/30/2023 CLINICAL DATA:  Head trauma, moderate-severe; Polytrauma, blunt EXAM: CT HEAD WITHOUT CONTRAST CT CERVICAL SPINE WITHOUT CONTRAST TECHNIQUE: Multidetector CT imaging of the head and cervical spine was performed following the standard protocol without intravenous contrast. Multiplanar CT image reconstructions of the cervical spine were also generated. RADIATION DOSE REDUCTION: This exam was performed according to the departmental dose-optimization program which includes automated exposure control, adjustment of the mA and/or kV according to patient size and/or use of iterative reconstruction technique. COMPARISON:  CT head and C-spine 09/27/2023 FINDINGS: CT HEAD FINDINGS Brain: Stable prominence of the lateral ventricles may be related to central predominant atrophy, although a component of normal pressure/communicating hydrocephalus cannot be excluded. Patchy and confluent areas of decreased attenuation are noted  throughout the deep and periventricular Jalyn Rosero matter of the cerebral hemispheres bilaterally, compatible with chronic microvascular ischemic disease. No evidence of large-territorial  acute infarction. No parenchymal hemorrhage. No mass lesion. No extra-axial collection. No mass effect or midline shift. No hydrocephalus. Basilar cisterns are patent. Vascular: No hyperdense vessel. Skull: No acute fracture or focal lesion. Sinuses/Orbits: Left maxillary sinus mucosal thickening. Paranasal sinuses and mastoid air cells are clear. The orbits are unremarkable. Other: None. CT CERVICAL SPINE FINDINGS Alignment: Normal. Skull base and vertebrae: Multilevel degenerative changes spine most prominent at the C5 through C7 levels. No associated severe osseous neural foraminal or central canal stenosis. No acute fracture. No aggressive appearing focal osseous lesion or focal pathologic process. Soft tissues and spinal canal: No prevertebral fluid or swelling. No visible canal hematoma. Upper chest: Unremarkable. Other: Atherosclerotic plaque of the carotid arteries within the neck. IMPRESSION: 1. No acute intracranial abnormality. 2. No acute displaced fracture or traumatic listhesis of the cervical spine. 3. Stable prominence of the lateral ventricles may be related to central predominant atrophy, although a component of normal pressure/communicating hydrocephalus cannot be excluded. These results were called by telephone at the time of interpretation on 09/30/2023 at 1:45 am to provider Dr. Cliffton Asters, Who verbally acknowledged these results. Electronically Signed   By: Tish Frederickson M.D.   On: 09/30/2023 01:53   CT CERVICAL SPINE WO CONTRAST Result Date: 09/30/2023 CLINICAL DATA:  Head trauma, moderate-severe; Polytrauma, blunt EXAM: CT HEAD WITHOUT CONTRAST CT CERVICAL SPINE WITHOUT CONTRAST TECHNIQUE: Multidetector CT imaging of the head and cervical spine was performed following the standard protocol without intravenous contrast. Multiplanar CT image reconstructions of the cervical spine were also generated. RADIATION DOSE REDUCTION: This exam was performed according to the departmental  dose-optimization program which includes automated exposure control, adjustment of the mA and/or kV according to patient size and/or use of iterative reconstruction technique. COMPARISON:  CT head and C-spine 09/27/2023 FINDINGS: CT HEAD FINDINGS Brain: Stable prominence of the lateral ventricles may be related to central predominant atrophy, although a component of normal pressure/communicating hydrocephalus cannot be excluded. Patchy and confluent areas of decreased attenuation are noted throughout the deep and periventricular Andreia Gandolfi matter of the cerebral hemispheres bilaterally, compatible with chronic microvascular ischemic disease. No evidence of large-territorial acute infarction. No parenchymal hemorrhage. No mass lesion. No extra-axial collection. No mass effect or midline shift. No hydrocephalus. Basilar cisterns are patent. Vascular: No hyperdense vessel. Skull: No acute fracture or focal lesion. Sinuses/Orbits: Left maxillary sinus mucosal thickening. Paranasal sinuses and mastoid air cells are clear. The orbits are unremarkable. Other: None. CT CERVICAL SPINE FINDINGS Alignment: Normal. Skull base and vertebrae: Multilevel degenerative changes spine most prominent at the C5 through C7 levels. No associated severe osseous neural foraminal or central canal stenosis. No acute fracture. No aggressive appearing focal osseous lesion or focal pathologic process. Soft tissues and spinal canal: No prevertebral fluid or swelling. No visible canal hematoma. Upper chest: Unremarkable. Other: Atherosclerotic plaque of the carotid arteries within the neck. IMPRESSION: 1. No acute intracranial abnormality. 2. No acute displaced fracture or traumatic listhesis of the cervical spine. 3. Stable prominence of the lateral ventricles may be related to central predominant atrophy, although a component of normal pressure/communicating hydrocephalus cannot be excluded. These results were called by telephone at the time of  interpretation on 09/30/2023 at 1:45 am to provider Dr. Cliffton Asters, Who verbally acknowledged these results. Electronically Signed   By: Tish Frederickson M.D.   On: 09/30/2023 01:53   DG  Pelvis Portable Result Date: 09/30/2023 CLINICAL DATA:  Trauma EXAM: PORTABLE PELVIS 1-2 VIEWS COMPARISON:  CT abdomen pelvis 09/30/2023, x-ray left femur 09/27/2023 FINDINGS: Bilateral total hip arthroplasty partially visualized. No radiographic findings suggest surgical hardware complication. There is no evidence of pelvic fracture or diastasis. No pelvic bone lesions are seen. Vascular calcification. IMPRESSION: Negative for acute traumatic injury. Electronically Signed   By: Tish Frederickson M.D.   On: 09/30/2023 01:49   DG Chest Port 1 View Result Date: 09/30/2023 CLINICAL DATA:  Trauma EXAM: PORTABLE CHEST 1 VIEW COMPARISON:  CT chest 09/30/2023. chest x-ray 09/27/2023 FINDINGS: Wireless cardiac device overlies the left chest. Coronary artery stents. The heart and mediastinal contours are unchanged. No focal consolidation. No pulmonary edema. No pleural effusion. No pneumothorax. No acute osseous abnormality.  Chronic fractured sternotomy wires. IMPRESSION: No active disease. These results were called by telephone at the time of interpretation on 09/30/2023 at 1:45 am to provider Dr. Cliffton Asters, Who verbally acknowledged these results. Electronically Signed   By: Tish Frederickson M.D.   On: 09/30/2023 01:49    ROS -Unable to obtain due to condition of patient - non verbal for long lists of questions  PE Blood pressure 125/67, pulse 87, temperature (!) 96.8 F (36 C), temperature source Temporal, resp. rate 13, height 5\' 10"  (1.778 m), weight 68 kg, SpO2 99%.  Physical Exam  Constitutional: NAD; conversant but nonverbal - can give thumbs up when asked. Abrasions to face/scalp. Poor dentition Eyes: Moist conjunctiva; no lid lag; anicteric; PERRL Neck: Trachea midline; Lungs: Normal respiratory effort; CTAB CV: no  pitting edema GI: Abd soft, NT/ND MSK: Normal range of motion of extremities; no deformities Psychiatric: Appropriate affect  Results for orders placed or performed during the hospital encounter of 09/30/23 (from the past 48 hours)  CBC     Status: Abnormal   Collection Time: 09/30/23  1:24 AM  Result Value Ref Range   WBC 6.8 4.0 - 10.5 K/uL   RBC 2.46 (L) 4.22 - 5.81 MIL/uL   Hemoglobin 8.0 (L) 13.0 - 17.0 g/dL   HCT 78.2 (L) 95.6 - 21.3 %   MCV 99.2 80.0 - 100.0 fL   MCH 32.5 26.0 - 34.0 pg   MCHC 32.8 30.0 - 36.0 g/dL   RDW 08.6 57.8 - 46.9 %   Platelets 169 150 - 400 K/uL   nRBC 0.0 0.0 - 0.2 %    Comment: Performed at [facility], [address], [place], [place] [address]  Sample to Blood Bank     Status: None   Collection Time: 09/30/23  1:24 AM  Result Value Ref Range   Blood Bank Specimen SAMPLE AVAILABLE FOR TESTING    Sample Expiration      10/03/2023,2359 Performed at [facility], [address] [place], [place] [address]   I-Stat Chem 8, ED     Status: Abnormal   Collection Time: 09/30/23  1:34 AM  Result Value Ref Range   Sodium 139 135 - 145 mmol/L   Potassium 3.1 (L) 3.5 - 5.1 mmol/L   Chloride 106 98 - 111 mmol/L   BUN 17 8 - 23 mg/dL   Creatinine, Ser 4.40 0.61 - 1.24 mg/dL   Glucose, Bld 102 (H) 70 - 99 mg/dL    Comment: Glucose reference range applies only to samples taken after fasting for at least 8 hours.   Calcium, Ion 1.05 (L) 1.15 - 1.40 mmol/L   TCO2 21 (L) 22 - 32  mmol/L   Hemoglobin 7.1 (L) 13.0 - 17.0 g/dL   HCT 56.2 (L) 13.0 - 86.5 %  I-Stat Lactic Acid, ED     Status: Abnormal   Collection Time: 09/30/23  1:34 AM  Result Value Ref Range   Lactic Acid, Venous 4.1 (HH) 0.5 - 1.9 mmol/L   Comment NOTIFIED PHYSICIAN     CT CHEST ABDOMEN PELVIS W CONTRAST Result Date: 09/30/2023 CLINICAL DATA:  Polytrauma, blunt EXAM: CT CHEST, ABDOMEN, AND PELVIS WITH CONTRAST TECHNIQUE: Multidetector CT imaging of the chest, abdomen and  pelvis was performed following the standard protocol during bolus administration of intravenous contrast. RADIATION DOSE REDUCTION: This exam was performed according to the departmental dose-optimization program which includes automated exposure control, adjustment of the mA and/or kV according to patient size and/or use of iterative reconstruction technique. CONTRAST:  75mL OMNIPAQUE IOHEXOL 350 MG/ML SOLN COMPARISON:  CT angio chest 06/17/2022, ultrasound renal 02/12/2023, x-ray pelvis 09/30/2023 FINDINGS: CHEST: Cardiovascular: No aortic injury. 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 Four-vessel coronary calcification. Status post coronary bypass graft and stents. The heart is normal in size. No significant pericardial effusion. Mediastinum/Nodes: No pneumomediastinum. No mediastinal hematoma. The esophagus is unremarkable. The thyroid is unremarkable. The central airways are patent. No mediastinal, hilar, or axillary lymphadenopathy. Lungs/Pleura: No focal consolidation. No pulmonary nodule. No pulmonary mass. No pulmonary contusion or laceration. No pneumatocele formation. Chronic right rib fractures. No pleural effusion. No pneumothorax. No hemothorax. Musculoskeletal/Chest wall: No chest wall mass. No acute rib or sternal fracture. No spinal fracture. Multilevel degenerative changes of the spine. Multiple chronically fractured sternotomy wires. ABDOMEN / PELVIS: Hepatobiliary: Not enlarged. No focal lesion. No laceration or subcapsular hematoma. Calcified gallstones within the gallbladder lumen. No gallbladder wall thickening or pericholecystic fluid. No biliary ductal dilatation. Pancreas: Normal pancreatic contour. No main pancreatic duct dilatation. Spleen: Not enlarged. No focal lesion. No laceration, subcapsular hematoma, or vascular injury. Adrenals/Urinary Tract: No nodularity bilaterally. Bilateral kidneys enhance symmetrically. No hydronephrosis. No contusion, laceration, or  subcapsular hematoma. No injury to the vascular structures or collecting systems. No hydroureter. The urinary bladder is unremarkable. Stomach/Bowel: No small or large bowel wall thickening or dilatation. The appendix is not definitely identified with no inflammatory changes in the right lower quadrant to suggest acute appendicitis. Vasculature/Lymphatics: No abdominal aorta or iliac aneurysm. No active contrast extravasation or pseudoaneurysm. No abdominal, pelvic, inguinal lymphadenopathy. Reproductive: Normal. Other: No simple free fluid ascites. No pneumoperitoneum. No hemoperitoneum. No mesenteric hematoma identified. No organized fluid collection. Musculoskeletal: Right hip subcutaneus soft tissue hematoma formation. No acute pelvic fracture. No spinal fracture. Multilevel severe degenerative changes of the spine. 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 Partially visualized total bilateral hip arthroplasty. Ports and Devices: None. IMPRESSION: 1. Right hip subcutaneus soft tissue hematoma formation. 2. No acute intrathoracic, intra-abdominal, intrapelvic traumatic injury. 3. No acute fracture or traumatic malalignment of the thoracic or lumbar spine. Other imaging findings of potential clinical significance: 1. Cholelithiasis with no CT evidence of acute cholecystitis. 2. Stable ascending thoracic aorta (4.3 cm). Recommend annual imaging followup by CTA or MRA. This recommendation follows 2010 ACCF/AHA/AATS/ACR/ASA/SCA/SCAI/SIR/STS/SVM Guidelines for the Diagnosis and Management of Patients with Thoracic Aortic Disease. Circulation. 2010; 121: H846-N629. Aortic aneurysm NOS (ICD10-I71.9). These results were called by telephone at the time of interpretation on 09/30/2023 at 1:45 am to provider Dr. Cliffton Asters, Who verbally acknowledged these results. Electronically Signed   By: Normajean Glasgow.D.  On: 09/30/2023 02:00   CT HEAD WO CONTRAST Result Date:  09/30/2023 CLINICAL DATA:  Head trauma, moderate-severe; Polytrauma, blunt EXAM: CT HEAD WITHOUT CONTRAST CT CERVICAL SPINE WITHOUT CONTRAST TECHNIQUE: Multidetector CT imaging of the head and cervical spine was performed following the standard protocol without intravenous contrast. Multiplanar CT image reconstructions of the cervical spine were also generated. RADIATION DOSE REDUCTION: This exam was performed according to the departmental dose-optimization program which includes automated exposure control, adjustment of the mA and/or kV according to patient size and/or use of iterative reconstruction technique. COMPARISON:  CT head and C-spine 09/27/2023 FINDINGS: CT HEAD FINDINGS Brain: Stable prominence of the lateral ventricles may be related to central predominant atrophy, although a component of normal pressure/communicating hydrocephalus cannot be excluded. Patchy and confluent areas of decreased attenuation are noted throughout the deep and periventricular Adam [name] matter of the cerebral hemispheres bilaterally, compatible with chronic microvascular ischemic disease. No evidence of large-territorial acute infarction. No parenchymal hemorrhage. No mass lesion. No extra-axial collection. No mass effect or midline shift. No hydrocephalus. Basilar cisterns are patent. Vascular: No hyperdense vessel. Skull: No acute fracture or focal lesion. Sinuses/Orbits: Left maxillary sinus mucosal thickening. Paranasal sinuses and mastoid air cells are clear. The orbits are unremarkable. Other: None. CT CERVICAL SPINE FINDINGS Alignment: Normal. Skull base and vertebrae: Multilevel degenerative changes spine most prominent at the C5 through C7 levels. No associated severe osseous neural foraminal or central canal stenosis. No acute fracture. No aggressive appearing focal osseous lesion or focal pathologic process. Soft tissues and spinal canal: No prevertebral fluid or swelling. No visible canal hematoma. Upper chest:  Unremarkable. Other: Atherosclerotic plaque of the carotid arteries within the neck. IMPRESSION: 1. No acute intracranial abnormality. 2. No acute displaced fracture or traumatic listhesis of the cervical spine. 3. Stable prominence of the lateral ventricles may be related to central predominant atrophy, although a component of normal pressure/communicating hydrocephalus cannot be excluded. These results were called by telephone at the time of interpretation on 09/30/2023 at 1:45 am to provider Dr. Cliffton Asters, Who verbally acknowledged these results. Electronically Signed   By: Tish Frederickson M.D.   On: 09/30/2023 01:53   CT CERVICAL SPINE WO CONTRAST Result Date: 09/30/2023 CLINICAL DATA:  Head trauma, moderate-severe; Polytrauma, blunt EXAM: CT HEAD WITHOUT CONTRAST CT CERVICAL SPINE WITHOUT CONTRAST TECHNIQUE: Multidetector CT imaging of the head and cervical spine was performed following the standard protocol without intravenous contrast. Multiplanar CT image reconstructions of the cervical spine were also generated. RADIATION DOSE REDUCTION: This exam was performed according to the departmental dose-optimization program which includes automated exposure control, adjustment of the mA and/or kV according to patient size and/or use of iterative reconstruction technique. COMPARISON:  CT head and C-spine 09/27/2023 FINDINGS: CT HEAD FINDINGS Brain: Stable prominence of the lateral ventricles may be related to central predominant atrophy, although a component of normal pressure/communicating hydrocephalus cannot be excluded. Patchy and confluent areas of decreased attenuation are noted throughout the deep and periventricular Marqueta Pulley matter of the cerebral hemispheres bilaterally, compatible with chronic microvascular ischemic disease. No evidence of large-territorial acute infarction. No parenchymal hemorrhage. No mass lesion. No extra-axial collection. No mass effect or midline shift. No hydrocephalus. Basilar  cisterns are patent. Vascular: No hyperdense vessel. Skull: No acute fracture or focal lesion. Sinuses/Orbits: Left maxillary sinus mucosal thickening. Paranasal sinuses and mastoid air cells are clear. The orbits are unremarkable. Other: None. CT CERVICAL SPINE FINDINGS Alignment: Normal. Skull base and vertebrae: Multilevel degenerative changes spine most prominent at the  C5 through C7 levels. No associated severe osseous neural foraminal or central canal stenosis. No acute fracture. No aggressive appearing focal osseous lesion or focal pathologic process. Soft tissues and spinal canal: No prevertebral fluid or swelling. No visible canal hematoma. Upper chest: Unremarkable. Other: Atherosclerotic plaque of the carotid arteries within the neck. IMPRESSION: 1. No acute intracranial abnormality. 2. No acute displaced fracture or traumatic listhesis of the cervical spine. 3. Stable prominence of the lateral ventricles may be related to central predominant atrophy, although a component of normal pressure/communicating hydrocephalus cannot be excluded. These results were called by telephone at the time of interpretation on 09/30/2023 at 1:45 am to provider Dr. Cliffton Asters, Who verbally acknowledged these results. Electronically Signed   By: Tish Frederickson M.D.   On: 09/30/2023 01:53   DG Pelvis Portable Result Date: 09/30/2023 CLINICAL DATA:  Trauma EXAM: PORTABLE PELVIS 1-2 VIEWS COMPARISON:  CT abdomen pelvis 09/30/2023, x-ray left femur 09/27/2023 FINDINGS: Bilateral total hip arthroplasty partially visualized. No radiographic findings suggest surgical hardware complication. There is no evidence of pelvic fracture or diastasis. No pelvic bone lesions are seen. Vascular calcification. IMPRESSION: Negative for acute traumatic injury. Electronically Signed   By: Tish Frederickson M.D.   On: 09/30/2023 01:49   DG Chest Port 1 View Result Date: 09/30/2023 CLINICAL DATA:  Trauma EXAM: PORTABLE CHEST 1 VIEW COMPARISON:  CT  chest 09/30/2023. chest x-ray 09/27/2023 FINDINGS: Wireless cardiac device overlies the left chest. Coronary artery stents. The heart and mediastinal contours are unchanged. No focal consolidation. No pulmonary edema. No pleural effusion. No pneumothorax. No acute osseous abnormality.  Chronic fractured sternotomy wires. IMPRESSION: No active disease. These results were called by telephone at the time of interpretation on 09/30/2023 at 1:45 am to provider Dr. Cliffton Asters, Who verbally acknowledged these results. Electronically Signed   By: Tish Frederickson M.D.   On: 09/30/2023 01:49      Assessment/Plan: 72yoM s/p fall on Xarelto  Acute blood loss anemia - as per primary; currently 8.0 on cbc. Had CBC 12/24 with hgb 10. Hip subQ hematoma - monitor; hold Xarelto for time being Blood thinner - would recommend discussion about coming off this agent given 2 falls in 2 days documented here  Dispo - medicine to admit/obs; we will see for tertiary eval in AM  I spent a total of 60 minutes in both face-to-face and non-face-to-face activities, excluding procedures performed, for this visit on the date of this encounter.  Marin Olp, MD [facility]

## 2023-09-30 NOTE — ED Notes (Signed)
Pt's right hip has large ecchymotic area. Right mid back and left mid back has ecchymotic areas.

## 2023-09-30 NOTE — ED Notes (Signed)
ED TO INPATIENT HANDOFF REPORT  ED Nurse Name and Phone #:  Dwain Sarna RN [phone] S Name/Age/Gender Adam [name] 72 y.o. male Room/Bed: 040C/040C  Code Status   Code Status: Full Code  Home/SNF/Other Home Patient oriented to: Patient is non-verbal, this is baseline. Patient uses non-verbal cues & follows commands, appears to be oriented. Patient is alert. Is this baseline? Yes    Triage Complete: Triage complete  Chief Complaint Acute on chronic anemia [D64.9]  Triage Note Arrives [facility] as a level 1 fall on blood thinners coming from home.  Eliquis.   Per paramedics pt had fallen in living room striking head on nearby table. Bleeding controlled. Unmeasured laceration to right temple.   Reported by paramedics that pt seen in ED 2 days ago for a fall at home. Has pre-existing staples from a previous fall.   Nonverbal at baseline but able to give thumbs up and down. Reported that patient is at baseline.   Initial sbp 70 palpated upon paramedic arrival. 500cc NS administered with improvements to 106 palpated.   C-collar present and in correct alignment.   Bruising to bilateral flanks and left knee.   Undisclosed alcohol consumption prior to arrival.    Allergies No Known Allergies  Level of Care/Admitting Diagnosis ED Disposition     ED Disposition  Admit   Condition  --   Comment  Hospital Area: [facility] [[id number]]  Level of Care: Progressive [102]  Admit to Progressive based on following criteria: MULTISYSTEM THREATS such as stable sepsis, metabolic/electrolyte imbalance with or without encephalopathy that is responding to early treatment.  May admit patient to [facility] or [facility] if equivalent level of care is available:: Yes  Covid Evaluation: Asymptomatic - no recent exposure (last 10 days) testing not required  Diagnosis: Acute on chronic anemia [[id number]]  Admitting Physician: Orland Mustard [[id number]]  Attending Physician:  Orland Mustard [phone]  Certification:: I certify this patient will need inpatient services for at least 2 midnights  Expected Medical Readiness: 10/03/2023          B Medical/Surgery History History reviewed. No pertinent past medical history. History reviewed. No pertinent surgical history.   A IV Location/Drains/Wounds Patient Lines/Drains/Airways Status     Active Line/Drains/Airways     Name Placement date Placement time Site Days   Peripheral IV 09/30/23 20 G 1" Left Antecubital 09/30/23  0120  Antecubital  less than 1   Peripheral IV 09/30/23 18 G Right Antecubital 09/30/23  0205  Antecubital  less than 1   External Urinary Catheter 09/30/23  0206  --  less than 1   External Urinary Catheter 09/30/23  1748  --  less than 1            Intake/Output Last 24 hours No intake or output data in the 24 hours ending 09/30/23 2048  Labs/Imaging Results for orders placed or performed during the hospital encounter of 09/30/23 (from the past 48 hours)  Comprehensive metabolic panel     Status: Abnormal   Collection Time: 09/30/23  1:24 AM  Result Value Ref Range   Sodium 140 135 - 145 mmol/L   Potassium 3.2 (L) 3.5 - 5.1 mmol/L   Chloride 105 98 - 111 mmol/L   CO2 21 (L) 22 - 32 mmol/L   Glucose, Bld 113 (H) 70 - 99 mg/dL    Comment: Glucose reference range applies only to samples taken after fasting for at least 8 hours.   BUN 18  8 - 23 mg/dL   Creatinine, Ser 8.24 0.61 - 1.24 mg/dL   Calcium 8.9 8.9 - 23.5 mg/dL   Total Protein 5.8 (L) 6.5 - 8.1 g/dL   Albumin 3.1 (L) 3.5 - 5.0 g/dL   AST 49 (H) 15 - 41 U/L   ALT 30 0 - 44 U/L   Alkaline Phosphatase 40 38 - 126 U/L   Total Bilirubin 1.0 <1.2 mg/dL   GFR, Estimated >36 >14 mL/min    Comment: (NOTE) Calculated using the CKD-EPI Creatinine Equation (2021)    Anion gap 14 5 - 15    Comment: Performed at [facility], [address], [place], [place] [address]  CBC     Status: Abnormal   Collection Time:  09/30/23  1:24 AM  Result Value Ref Range   WBC 6.8 4.0 - 10.5 K/uL   RBC 2.46 (L) 4.22 - 5.81 MIL/uL   Hemoglobin 8.0 (L) 13.0 - 17.0 g/dL   HCT 00.8 (L) 67.6 - 19.5 %   MCV 99.2 80.0 - 100.0 fL   MCH 32.5 26.0 - 34.0 pg   MCHC 32.8 30.0 - 36.0 g/dL   RDW 09.3 26.7 - 12.4 %   Platelets 169 150 - 400 K/uL   nRBC 0.0 0.0 - 0.2 %    Comment: Performed at [facility], [address], [place], [place] [address]  Ethanol     Status: Abnormal   Collection Time: 09/30/23  1:24 AM  Result Value Ref Range   Alcohol, Ethyl (B) 30 (H) <10 mg/dL    Comment: (NOTE) Lowest detectable limit for serum alcohol is 10 mg/dL.  For medical purposes only. Performed at [facility], [address], [place], [place] [address]   Sample to Blood Bank     Status: None   Collection Time: 09/30/23  1:24 AM  Result Value Ref Range   Blood Bank Specimen SAMPLE AVAILABLE FOR TESTING    Sample Expiration      10/03/2023,2359 Performed at [facility], [address], [place], [place] [address]   Type and screen [facility]     Status: None (Preliminary result)   Collection Time: 09/30/23  1:24 AM  Result Value Ref Range   ABO/RH(D) O POS    Antibody Screen NEG    Sample Expiration 10/03/2023,2359    Unit Number [id number]    Blood Component Type RED CELLS,LR    Unit division 00    Status of Unit ISSUED    Transfusion Status OK TO TRANSFUSE    Crossmatch Result      Compatible Performed at [facility], [address] [place], [place] [address]   I-Stat Chem 8, ED     Status: Abnormal   Collection Time: 09/30/23  1:34 AM  Result Value Ref Range   Sodium 139 135 - 145 mmol/L   Potassium 3.1 (L) 3.5 - 5.1 mmol/L   Chloride 106 98 - 111 mmol/L   BUN 17 8 - 23 mg/dL   Creatinine, Ser 3.29 0.61 - 1.24 mg/dL   Glucose, Bld 924 (H) 70 - 99 mg/dL    Comment: Glucose reference range applies only to samples taken after fasting for at least 8 hours.    Calcium, Ion 1.05 (L) 1.15 - 1.40 mmol/L   TCO2 21 (L) 22 - 32 mmol/L   Hemoglobin 7.1 (L) 13.0 - 17.0 g/dL   HCT 26.8 (L) 34.1 - 96.2 %  I-Stat Lactic Acid, ED     Status: Abnormal   Collection Time: 09/30/23  1:34 AM  Result Value Ref Range   Lactic Acid, Venous 4.1 (HH) 0.5 - 1.9 mmol/L   Comment NOTIFIED PHYSICIAN   Protime-INR     Status: Abnormal   Collection Time: 09/30/23  2:00 AM  Result Value Ref Range   Prothrombin Time 16.1 (H) 11.4 - 15.2 seconds   INR 1.3 (H) 0.8 - 1.2    Comment: (NOTE) INR goal varies based on device and disease states. Performed at [facility], [address], [place], [place] [address]   CK     Status: None   Collection Time: 09/30/23  2:00 AM  Result Value Ref Range   Total CK 288 49 - 397 U/L    Comment: Performed at [facility], [address] [place], [place] [address]  ABO/Rh     Status: None   Collection Time: 09/30/23  2:00 AM  Result Value Ref Range   ABO/RH(D)      O POS Performed at [facility], [address], [place], [place] [address]   Vitamin B12     Status: None   Collection Time: 09/30/23  5:49 AM  Result Value Ref Range   Vitamin B-12 400 180 - 914 pg/mL    Comment: (NOTE) This assay is not validated for testing neonatal or myeloproliferative syndrome specimens for Vitamin B12 levels. Performed at [facility], [address] [place], [place] [address]   Folate     Status: None   Collection Time: 09/30/23  5:49 AM  Result Value Ref Range   Folate 26.1 >5.9 ng/mL    Comment: Performed at [facility], [address] [place], [place] [address]  Iron and TIBC     Status: Abnormal   Collection Time: 09/30/23  5:49 AM  Result Value Ref Range   Iron 43 (L) 45 - 182 ug/dL   TIBC 657 846 - 962 ug/dL   Saturation Ratios 14 (L) 17.9 - 39.5 %   UIBC 266 ug/dL    Comment: Performed at [facility], [address], [place], [place] [address]  Ferritin     Status: None   Collection  Time: 09/30/23  5:49 AM  Result Value Ref Range   Ferritin 31 24 - 336 ng/mL    Comment: Performed at [facility], [address], [place], [place] [address]  Reticulocytes     Status: Abnormal   Collection Time: 09/30/23  5:49 AM  Result Value Ref Range   Retic Ct Pct 2.6 0.4 - 3.1 %   RBC. 2.20 (L) 4.22 - 5.81 MIL/uL   Retic Count, Absolute 56.5 19.0 - 186.0 K/uL   Immature Retic Fract 31.5 (H) 2.3 - 15.9 %    Comment: Performed at [facility], [address] [place], [place] [address]  Lactic acid, plasma     Status: None   Collection Time: 09/30/23  5:49 AM  Result Value Ref Range   Lactic Acid, Venous 0.9 0.5 - 1.9 mmol/L    Comment: Performed at [facility], [address], [place], [place] [address]  Hemoglobin and hematocrit, blood     Status: Abnormal   Collection Time: 09/30/23  6:57 AM  Result Value Ref Range   Hemoglobin 7.4 (L) 13.0 - 17.0 g/dL   HCT 44.0 (L) 34.7 - 42.5 %  Comment: Performed at [facility], [address], [place], [place] [address]  Magnesium     Status: None   Collection Time: 09/30/23  9:52 AM  Result Value Ref Range   Magnesium 2.0 1.7 - 2.4 mg/dL    Comment: Performed at [facility], [address] [place], [place] [address]  CBC     Status: Abnormal   Collection Time: 09/30/23  9:52 AM  Result Value Ref Range   WBC 5.8 4.0 - 10.5 K/uL   RBC 2.30 (L) 4.22 - 5.81 MIL/uL   Hemoglobin 7.5 (L) 13.0 - 17.0 g/dL   HCT 66.4 (L) 40.3 - 47.4 %   MCV 98.7 80.0 - 100.0 fL   MCH 32.6 26.0 - 34.0 pg   MCHC 33.0 30.0 - 36.0 g/dL   RDW 25.9 56.3 - 87.5 %   Platelets 143 (L) 150 - 400 K/uL   nRBC 0.0 0.0 - 0.2 %    Comment: Performed at [facility], [address] [place], [place] [address]  Prepare RBC (crossmatch)     Status: None   Collection Time: 09/30/23 11:02 AM  Result Value Ref Range   Order Confirmation      ORDER PROCESSED BY BLOOD BANK Performed at [facility], [address],  [place], [place] [address]   CBG monitoring, ED     Status: Abnormal   Collection Time: 09/30/23 11:14 AM  Result Value Ref Range   Glucose-Capillary 104 (H) 70 - 99 mg/dL    Comment: Glucose reference range applies only to samples taken after fasting for at least 8 hours.  CBG monitoring, ED     Status: None   Collection Time: 09/30/23  5:25 PM  Result Value Ref Range   Glucose-Capillary 92 70 - 99 mg/dL    Comment: Glucose reference range applies only to samples taken after fasting for at least 8 hours.  CBC     Status: Abnormal   Collection Time: 09/30/23  5:28 PM  Result Value Ref Range   WBC 5.5 4.0 - 10.5 K/uL   RBC 2.45 (L) 4.22 - 5.81 MIL/uL   Hemoglobin 7.9 (L) 13.0 - 17.0 g/dL   HCT 41.6 (L) 60.6 - 30.1 %   MCV 97.1 80.0 - 100.0 fL   MCH 32.2 26.0 - 34.0 pg   MCHC 33.2 30.0 - 36.0 g/dL   RDW 60.1 09.3 - 23.5 %   Platelets 142 (L) 150 - 400 K/uL   nRBC 0.0 0.0 - 0.2 %    Comment: Performed at [facility], [address] [place], [place] [address]   DG Femur Min 2 Views Left Result Date: 09/30/2023 CLINICAL DATA:  Level 1 trauma patient, fall injury. EXAM: LEFT FEMUR 2 VIEWS; LEFT KNEE - COMPLETE 4+ VIEW COMPARISON:  Left femoral and left knee series from 3 days ago 09/27/2023 FINDINGS: AP and lateral left femur: Left hip arthroplasty with cemented femoral component without visible evidence of loosening. There is osteopenia without evidence for fractures or other focal bone lesions. No focal soft tissue swelling is seen. There is artifact from overlying clothing. There is contrast in the bladder and the distal left ureter. There is heavy vascular calcification. Left knee, four views: Small suprapatellar bursal effusion is unchanged. There is osteopenia without evidence of fracture or dislocation. Tricompartmental joint narrowing and osteophytosis is seen, greatest narrowing in the medial femorotibial joint Superficial soft tissues are unremarkable. The popliteal artery and  trifurcation arteries are heavily calcified. IMPRESSION: 1. No evidence of fractures or other new findings in the left femur or left knee. Small suprapatellar bursal effusion is unchanged. 2. Osteopenia and degenerative change. 3. Heavy vascular calcification. Electronically Signed   By: Almira Bar M.D.   On: 09/30/2023 02:45   DG Knee Complete 4 Views Left Result Date: 09/30/2023 CLINICAL DATA:  Level 1 trauma patient, fall injury. EXAM: LEFT FEMUR 2 VIEWS; LEFT KNEE - COMPLETE 4+ VIEW COMPARISON:  Left femoral and left knee series from 3 days ago 09/27/2023 FINDINGS: AP and lateral left femur: Left hip arthroplasty with cemented femoral component without visible evidence of loosening. There is osteopenia without evidence for fractures or other focal bone lesions. No focal soft tissue swelling is seen. There is artifact from overlying clothing. There is contrast in the bladder and the distal left ureter. There is heavy vascular calcification. Left knee, four views: Small suprapatellar bursal effusion is unchanged. There is osteopenia without evidence of fracture or dislocation. Tricompartmental joint narrowing and osteophytosis is seen, greatest narrowing in the medial femorotibial joint Superficial soft tissues are unremarkable. The popliteal artery and trifurcation arteries are heavily calcified. IMPRESSION: 1. No evidence of fractures or other new findings in the left femur or left knee. Small suprapatellar bursal effusion is unchanged. 2. Osteopenia and degenerative change. 3. Heavy vascular calcification. Electronically Signed   By: Almira Bar M.D.   On: 09/30/2023 02:45   CT CHEST ABDOMEN PELVIS W CONTRAST Result Date: 09/30/2023 CLINICAL DATA:  Polytrauma, blunt EXAM: CT CHEST, ABDOMEN, AND PELVIS WITH CONTRAST TECHNIQUE: Multidetector CT imaging of the chest, abdomen and pelvis was performed following the standard protocol during bolus administration of intravenous contrast. RADIATION DOSE  REDUCTION: This exam was performed according to the departmental dose-optimization program which includes automated exposure control, adjustment of the mA and/or kV according to patient size and/or use of iterative reconstruction technique. CONTRAST:  75mL OMNIPAQUE IOHEXOL 350 MG/ML SOLN COMPARISON:  CT angio chest 06/17/2022, ultrasound renal 02/12/2023, x-ray pelvis 09/30/2023 FINDINGS: CHEST: Cardiovascular: No aortic injury. 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 Four-vessel coronary calcification. Status post coronary bypass graft and stents. The heart is normal in size. No significant pericardial effusion. Mediastinum/Nodes: No pneumomediastinum. No mediastinal hematoma. The esophagus is unremarkable. The thyroid is unremarkable. The central airways are patent. No mediastinal, hilar, or axillary lymphadenopathy. Lungs/Pleura: No focal consolidation. No pulmonary nodule. No pulmonary mass. No pulmonary contusion or laceration. No pneumatocele formation. Chronic right rib fractures. No pleural effusion. No pneumothorax. No hemothorax. Musculoskeletal/Chest wall: No chest wall mass. No acute rib or sternal fracture. No spinal fracture. Multilevel degenerative changes of the spine. Multiple chronically fractured sternotomy wires. ABDOMEN / PELVIS: Hepatobiliary: Not enlarged. No focal lesion. No laceration or subcapsular hematoma. Calcified gallstones within the gallbladder lumen. No gallbladder wall thickening or pericholecystic fluid. No biliary ductal dilatation. Pancreas: Normal pancreatic contour. No main pancreatic duct dilatation. Spleen: Not enlarged. No focal lesion. No laceration, subcapsular hematoma, or vascular injury. Adrenals/Urinary Tract: No nodularity bilaterally. Bilateral kidneys enhance symmetrically. No hydronephrosis. No contusion, laceration, or subcapsular hematoma. No injury to the vascular structures or collecting systems. No hydroureter. The urinary bladder is  unremarkable. Stomach/Bowel: No small or large bowel wall thickening or dilatation. The appendix is not definitely identified with no inflammatory changes in the right lower quadrant to suggest acute appendicitis. Vasculature/Lymphatics: No abdominal aorta or iliac aneurysm. No active contrast extravasation or pseudoaneurysm. No abdominal, pelvic, inguinal lymphadenopathy. Reproductive: Normal.  Other: No simple free fluid ascites. No pneumoperitoneum. No hemoperitoneum. No mesenteric hematoma identified. No organized fluid collection. Musculoskeletal: Right hip subcutaneus soft tissue hematoma formation. No acute pelvic fracture. No spinal fracture. Multilevel severe degenerative changes of the spine. 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 Partially visualized total bilateral hip arthroplasty. Ports and Devices: None. IMPRESSION: 1. Right hip subcutaneus soft tissue hematoma formation. 2. No acute intrathoracic, intra-abdominal, intrapelvic traumatic injury. 3. No acute fracture or traumatic malalignment of the thoracic or lumbar spine. Other imaging findings of potential clinical significance: 1. Cholelithiasis with no CT evidence of acute cholecystitis. 2. Stable ascending thoracic aorta (4.3 cm). Recommend annual imaging followup by CTA or MRA. This recommendation follows 2010 ACCF/AHA/AATS/ACR/ASA/SCA/SCAI/SIR/STS/SVM Guidelines for the Diagnosis and Management of Patients with Thoracic Aortic Disease. Circulation. 2010; 121: I696-E952. Aortic aneurysm NOS (ICD10-I71.9). These results were called by telephone at the time of interpretation on 09/30/2023 at 1:45 am to provider Dr. Cliffton Asters, Who verbally acknowledged these results. Electronically Signed   By: Tish Frederickson M.D.   On: 09/30/2023 02:00   CT HEAD WO CONTRAST Result Date: 09/30/2023 CLINICAL DATA:  Head trauma, moderate-severe; Polytrauma, blunt EXAM: CT HEAD WITHOUT CONTRAST CT CERVICAL SPINE  WITHOUT CONTRAST TECHNIQUE: Multidetector CT imaging of the head and cervical spine was performed following the standard protocol without intravenous contrast. Multiplanar CT image reconstructions of the cervical spine were also generated. RADIATION DOSE REDUCTION: This exam was performed according to the departmental dose-optimization program which includes automated exposure control, adjustment of the mA and/or kV according to patient size and/or use of iterative reconstruction technique. COMPARISON:  CT head and C-spine 09/27/2023 FINDINGS: CT HEAD FINDINGS Brain: Stable prominence of the lateral ventricles may be related to central predominant atrophy, although a component of normal pressure/communicating hydrocephalus cannot be excluded. Patchy and confluent areas of decreased attenuation are noted throughout the deep and periventricular white matter of the cerebral hemispheres bilaterally, compatible with chronic microvascular ischemic disease. No evidence of large-territorial acute infarction. No parenchymal hemorrhage. No mass lesion. No extra-axial collection. No mass effect or midline shift. No hydrocephalus. Basilar cisterns are patent. Vascular: No hyperdense vessel. Skull: No acute fracture or focal lesion. Sinuses/Orbits: Left maxillary sinus mucosal thickening. Paranasal sinuses and mastoid air cells are clear. The orbits are unremarkable. Other: None. CT CERVICAL SPINE FINDINGS Alignment: Normal. Skull base and vertebrae: Multilevel degenerative changes spine most prominent at the C5 through C7 levels. No associated severe osseous neural foraminal or central canal stenosis. No acute fracture. No aggressive appearing focal osseous lesion or focal pathologic process. Soft tissues and spinal canal: No prevertebral fluid or swelling. No visible canal hematoma. Upper chest: Unremarkable. Other: Atherosclerotic plaque of the carotid arteries within the neck. IMPRESSION: 1. No acute intracranial  abnormality. 2. No acute displaced fracture or traumatic listhesis of the cervical spine. 3. Stable prominence of the lateral ventricles may be related to central predominant atrophy, although a component of normal pressure/communicating hydrocephalus cannot be excluded. These results were called by telephone at the time of interpretation on 09/30/2023 at 1:45 am to provider Dr. Cliffton Asters, Who verbally acknowledged these results. Electronically Signed   By: Tish Frederickson M.D.   On: 09/30/2023 01:53   CT CERVICAL SPINE WO CONTRAST Result Date: 09/30/2023 CLINICAL DATA:  Head trauma, moderate-severe; Polytrauma, blunt EXAM: CT HEAD WITHOUT CONTRAST CT CERVICAL SPINE WITHOUT CONTRAST TECHNIQUE: Multidetector CT imaging of the head and cervical spine was performed following the standard protocol without  intravenous contrast. Multiplanar CT image reconstructions of the cervical spine were also generated. RADIATION DOSE REDUCTION: This exam was performed according to the departmental dose-optimization program which includes automated exposure control, adjustment of the mA and/or kV according to patient size and/or use of iterative reconstruction technique. COMPARISON:  CT head and C-spine 09/27/2023 FINDINGS: CT HEAD FINDINGS Brain: Stable prominence of the lateral ventricles may be related to central predominant atrophy, although a component of normal pressure/communicating hydrocephalus cannot be excluded. Patchy and confluent areas of decreased attenuation are noted throughout the deep and periventricular white matter of the cerebral hemispheres bilaterally, compatible with chronic microvascular ischemic disease. No evidence of large-territorial acute infarction. No parenchymal hemorrhage. No mass lesion. No extra-axial collection. No mass effect or midline shift. No hydrocephalus. Basilar cisterns are patent. Vascular: No hyperdense vessel. Skull: No acute fracture or focal lesion. Sinuses/Orbits: Left maxillary  sinus mucosal thickening. Paranasal sinuses and mastoid air cells are clear. The orbits are unremarkable. Other: None. CT CERVICAL SPINE FINDINGS Alignment: Normal. Skull base and vertebrae: Multilevel degenerative changes spine most prominent at the C5 through C7 levels. No associated severe osseous neural foraminal or central canal stenosis. No acute fracture. No aggressive appearing focal osseous lesion or focal pathologic process. Soft tissues and spinal canal: No prevertebral fluid or swelling. No visible canal hematoma. Upper chest: Unremarkable. Other: Atherosclerotic plaque of the carotid arteries within the neck. IMPRESSION: 1. No acute intracranial abnormality. 2. No acute displaced fracture or traumatic listhesis of the cervical spine. 3. Stable prominence of the lateral ventricles may be related to central predominant atrophy, although a component of normal pressure/communicating hydrocephalus cannot be excluded. These results were called by telephone at the time of interpretation on 09/30/2023 at 1:45 am to provider Dr. Cliffton Asters, Who verbally acknowledged these results. Electronically Signed   By: Tish Frederickson M.D.   On: 09/30/2023 01:53   DG Pelvis Portable Result Date: 09/30/2023 CLINICAL DATA:  Trauma EXAM: PORTABLE PELVIS 1-2 VIEWS COMPARISON:  CT abdomen pelvis 09/30/2023, x-ray left femur 09/27/2023 FINDINGS: Bilateral total hip arthroplasty partially visualized. No radiographic findings suggest surgical hardware complication. There is no evidence of pelvic fracture or diastasis. No pelvic bone lesions are seen. Vascular calcification. IMPRESSION: Negative for acute traumatic injury. Electronically Signed   By: Tish Frederickson M.D.   On: 09/30/2023 01:49   DG Chest Port 1 View Result Date: 09/30/2023 CLINICAL DATA:  Trauma EXAM: PORTABLE CHEST 1 VIEW COMPARISON:  CT chest 09/30/2023. chest x-ray 09/27/2023 FINDINGS: Wireless cardiac device overlies the left chest. Coronary artery stents.  The heart and mediastinal contours are unchanged. No focal consolidation. No pulmonary edema. No pleural effusion. No pneumothorax. No acute osseous abnormality.  Chronic fractured sternotomy wires. IMPRESSION: No active disease. These results were called by telephone at the time of interpretation on 09/30/2023 at 1:45 am to provider Dr. Cliffton Asters, Who verbally acknowledged these results. Electronically Signed   By: Tish Frederickson M.D.   On: 09/30/2023 01:49    Pending Labs Unresulted Labs (From admission, onward)     Start     Ordered   10/01/23 0500  Basic metabolic panel  Tomorrow morning,   R        09/30/23 1135   10/01/23 0500  CBC  Tomorrow morning,   R        09/30/23 1135   09/30/23 0934  Occult blood card to lab, stool RN will collect  Once,   R       Question:  Specimen  to be collected by:  Answer:  RN will collect   09/30/23 0933   09/30/23 0928  CBC  Now then every 6 hours,   R (with TIMED occurrences)      09/30/23 0927   09/30/23 0127  Urinalysis, Routine w reflex microscopic -Urine, Clean Catch  [facility] ED TRAUMA PANEL [facility])  Once,   URGENT       Question:  Specimen Source  Answer:  Urine, Clean Catch   09/30/23 0127            Vitals/Pain Today's Vitals   09/30/23 1945 09/30/23 2000 09/30/23 2015 09/30/23 2031  BP:  102/66 (!) 101/55 (!) 96/55  Pulse: 76 87 80 85  Resp: 16 17 16    Temp:      TempSrc:      SpO2: 100% 100% 98%   Weight:      Height:      PainSc:        Isolation Precautions No active isolations  Medications Medications  thiamine (VITAMIN B1) tablet 100 mg ( Oral See Alternative 09/30/23 0954)    Or  thiamine (VITAMIN B1) injection 100 mg (100 mg Intravenous Given 09/30/23 0954)  folic acid (FOLVITE) tablet 1 mg (1 mg Oral Given 09/30/23 0956)  multivitamin with minerals tablet 1 tablet (1 tablet Oral Given 09/30/23 0956)  potassium chloride SA (KLOR-CON M) CR tablet 40 mEq (40 mEq Oral Patient Refused/Not Given 09/30/23 0548)   HYDROcodone-acetaminophen (NORCO/VICODIN) 5-325 MG per tablet 1 tablet (1 tablet Oral Given 09/30/23 0835)  insulin aspart (novoLOG) injection 0-9 Units ( Subcutaneous Not Given 09/30/23 1728)  pantoprazole (PROTONIX) injection 40 mg (40 mg Intravenous Given 09/30/23 0955)  0.9 %  sodium chloride infusion (Manually program via Guardrails IV Fluids) ( Intravenous Not Given 09/30/23 1258)  acetaminophen (TYLENOL) tablet 650 mg (has no administration in time range)    Or  acetaminophen (TYLENOL) suppository 650 mg (has no administration in time range)  morphine (PF) 2 MG/ML injection 2 mg (has no administration in time range)  ondansetron (ZOFRAN) tablet 4 mg (has no administration in time range)    Or  ondansetron (ZOFRAN) injection 4 mg (has no administration in time range)  0.9 %  sodium chloride infusion (40 mL/hr Intravenous Rate/Dose Verify 09/30/23 2032)  LORazepam (ATIVAN) injection 0-4 mg ( Intravenous Not Given 09/30/23 2031)    Followed by  LORazepam (ATIVAN) injection 0-4 mg (has no administration in time range)  amiodarone (PACERONE) tablet 200 mg (has no administration in time range)  iohexol (OMNIPAQUE) 350 MG/ML injection 75 mL (75 mLs Intravenous Contrast Given 09/30/23 0147)  lidocaine-EPINEPHrine-tetracaine (LET) topical gel (3 mLs Topical Given 09/30/23 0314)    Mobility walks     Focused Assessments Neuro Assessment Handoff:  Swallow screen pass? Yes  Cardiac Rhythm: Normal sinus rhythm       Neuro Assessment: Exceptions to [facility] Neuro Checks:      Has TPA been given? No If patient is a Neuro Trauma and patient is going to OR before floor call report to 4N Charge nurse: [phone] or [phone]   R Recommendations: See Admitting Provider Note  Report given to:   Additional Notes: NA

## 2023-09-30 NOTE — Progress Notes (Signed)
Orthopedic Tech Progress Note Patient Details:  Adam [name] Feb 12, 1951 [id number] Responded to  level 1 trauma, not needed at this time   Patient ID: Adam [name], male   DOB: 03-Aug-1951, 72 y.o.   MRN: [id number]  [name] 09/30/2023, 1:34 AM

## 2023-09-30 NOTE — Progress Notes (Signed)
Progress Note     Subjective: Patient is nonverbal at baseline. He gives thumbs up and down when asked questions. He points to the back of his head when I ask where his pain is greatest. He points to his left knee and thigh when I ask where else he has pain. He denies abdominal pain/N/V, SHOB.   Objective: Vital signs in last 24 hours: Temp:  [96.8 F (36 C)-97.7 F (36.5 C)] 97.7 F (36.5 C) (12/27 0542) Pulse Rate:  [83-93] 83 (12/27 0807) Resp:  [12-21] 17 (12/27 0730) BP: (98-125)/(54-67) 102/60 (12/27 0807) SpO2:  [99 %-100 %] 99 % (12/27 0730) Weight:  [68 kg] 68 kg (12/27 0134)    Intake/Output from previous day: No intake/output data recorded. Intake/Output this shift: No intake/output data recorded.  PE: General: pleasant, WD, male who is laying in bed in NAD HEENT:  Pupils equal and round. EOMs intact.  Ears and nose without any masses or lesions.  Mouth is pink and moist Heart: irregularly irregular. Normal rate. Palpable radial and pedal pulses bilaterally Right periorbital edema and ecchymosis with laceration below right eye with suture cdi Left posterior scalp laceration with staples cdi Lungs: CTAB.  Respiratory effort nonlabored Abd: soft, NT, ND, +BS, no masses, hernias, or organomegaly MSK:  Moves all extremities. Bilateral UE with AROM intact. Scattered abrasions. No significant edema RLE AROM intact without deformity or edema LLE with hematoma to left anterior and posterior knee and to left medial thigh. AROM intact. Some pain with flexion/extension of knee Skin: warm and dry. Scattered abrasions Psych: A&Ox3 with an appropriate affect.    Lab Results:  Recent Labs    09/30/23 0124 09/30/23 0134 09/30/23 0657  WBC 6.8  --   --   HGB 8.0* 7.1* 7.4*  HCT 24.4* 21.0* 22.6*  PLT 169  --   --    BMET Recent Labs    09/30/23 0124 09/30/23 0134  NA 140 139  K 3.2* 3.1*  CL 105 106  CO2 21*  --   GLUCOSE 113* 108*  BUN 18 17  CREATININE  1.05 1.00  CALCIUM 8.9  --    PT/INR Recent Labs    09/30/23 0200  LABPROT 16.1*  INR 1.3*   CMP     Component Value Date/Time   NA 139 09/30/2023 0134   K 3.1 (L) 09/30/2023 0134   CL 106 09/30/2023 0134   CO2 21 (L) 09/30/2023 0124   GLUCOSE 108 (H) 09/30/2023 0134   BUN 17 09/30/2023 0134   CREATININE 1.00 09/30/2023 0134   CALCIUM 8.9 09/30/2023 0124   PROT 5.8 (L) 09/30/2023 0124   ALBUMIN 3.1 (L) 09/30/2023 0124   AST 49 (H) 09/30/2023 0124   ALT 30 09/30/2023 0124   ALKPHOS 40 09/30/2023 0124   BILITOT 1.0 09/30/2023 0124   GFRNONAA >60 09/30/2023 0124   Lipase  No results found for: "LIPASE"     Studies/Results: DG Femur Min 2 Views Left Result Date: 09/30/2023 CLINICAL DATA:  Level 1 trauma patient, fall injury. EXAM: LEFT FEMUR 2 VIEWS; LEFT KNEE - COMPLETE 4+ VIEW COMPARISON:  Left femoral and left knee series from 3 days ago 09/27/2023 FINDINGS: AP and lateral left femur: Left hip arthroplasty with cemented femoral component without visible evidence of loosening. There is osteopenia without evidence for fractures or other focal bone lesions. No focal soft tissue swelling is seen. There is artifact from overlying clothing. There is contrast in the bladder and the distal left  ureter. There is heavy vascular calcification. Left knee, four views: Small suprapatellar bursal effusion is unchanged. There is osteopenia without evidence of fracture or dislocation. Tricompartmental joint narrowing and osteophytosis is seen, greatest narrowing in the medial femorotibial joint Superficial soft tissues are unremarkable. The popliteal artery and trifurcation arteries are heavily calcified. IMPRESSION: 1. No evidence of fractures or other new findings in the left femur or left knee. Small suprapatellar bursal effusion is unchanged. 2. Osteopenia and degenerative change. 3. Heavy vascular calcification. Electronically Signed   By: Almira Bar M.D.   On: 09/30/2023 02:45   DG  Knee Complete 4 Views Left Result Date: 09/30/2023 CLINICAL DATA:  Level 1 trauma patient, fall injury. EXAM: LEFT FEMUR 2 VIEWS; LEFT KNEE - COMPLETE 4+ VIEW COMPARISON:  Left femoral and left knee series from 3 days ago 09/27/2023 FINDINGS: AP and lateral left femur: Left hip arthroplasty with cemented femoral component without visible evidence of loosening. There is osteopenia without evidence for fractures or other focal bone lesions. No focal soft tissue swelling is seen. There is artifact from overlying clothing. There is contrast in the bladder and the distal left ureter. There is heavy vascular calcification. Left knee, four views: Small suprapatellar bursal effusion is unchanged. There is osteopenia without evidence of fracture or dislocation. Tricompartmental joint narrowing and osteophytosis is seen, greatest narrowing in the medial femorotibial joint Superficial soft tissues are unremarkable. The popliteal artery and trifurcation arteries are heavily calcified. IMPRESSION: 1. No evidence of fractures or other new findings in the left femur or left knee. Small suprapatellar bursal effusion is unchanged. 2. Osteopenia and degenerative change. 3. Heavy vascular calcification. Electronically Signed   By: Almira Bar M.D.   On: 09/30/2023 02:45   CT CHEST ABDOMEN PELVIS W CONTRAST Result Date: 09/30/2023 CLINICAL DATA:  Polytrauma, blunt EXAM: CT CHEST, ABDOMEN, AND PELVIS WITH CONTRAST TECHNIQUE: Multidetector CT imaging of the chest, abdomen and pelvis was performed following the standard protocol during bolus administration of intravenous contrast. RADIATION DOSE REDUCTION: This exam was performed according to the departmental dose-optimization program which includes automated exposure control, adjustment of the mA and/or kV according to patient size and/or use of iterative reconstruction technique. CONTRAST:  75mL OMNIPAQUE IOHEXOL 350 MG/ML SOLN COMPARISON:  CT angio chest 06/17/2022, ultrasound  renal 02/12/2023, x-ray pelvis 09/30/2023 FINDINGS: CHEST: Cardiovascular: No aortic injury. 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 Four-vessel coronary calcification. Status post coronary bypass graft and stents. The heart is normal in size. No significant pericardial effusion. Mediastinum/Nodes: No pneumomediastinum. No mediastinal hematoma. The esophagus is unremarkable. The thyroid is unremarkable. The central airways are patent. No mediastinal, hilar, or axillary lymphadenopathy. Lungs/Pleura: No focal consolidation. No pulmonary nodule. No pulmonary mass. No pulmonary contusion or laceration. No pneumatocele formation. Chronic right rib fractures. No pleural effusion. No pneumothorax. No hemothorax. Musculoskeletal/Chest wall: No chest wall mass. No acute rib or sternal fracture. No spinal fracture. Multilevel degenerative changes of the spine. Multiple chronically fractured sternotomy wires. ABDOMEN / PELVIS: Hepatobiliary: Not enlarged. No focal lesion. No laceration or subcapsular hematoma. Calcified gallstones within the gallbladder lumen. No gallbladder wall thickening or pericholecystic fluid. No biliary ductal dilatation. Pancreas: Normal pancreatic contour. No main pancreatic duct dilatation. Spleen: Not enlarged. No focal lesion. No laceration, subcapsular hematoma, or vascular injury. Adrenals/Urinary Tract: No nodularity bilaterally. Bilateral kidneys enhance symmetrically. No hydronephrosis. No contusion, laceration, or subcapsular hematoma. No injury to the vascular structures or collecting systems. No hydroureter. The urinary bladder is unremarkable. Stomach/Bowel:  No small or large bowel wall thickening or dilatation. The appendix is not definitely identified with no inflammatory changes in the right lower quadrant to suggest acute appendicitis. Vasculature/Lymphatics: No abdominal aorta or iliac aneurysm. No active contrast extravasation or pseudoaneurysm. No  abdominal, pelvic, inguinal lymphadenopathy. Reproductive: Normal. Other: No simple free fluid ascites. No pneumoperitoneum. No hemoperitoneum. No mesenteric hematoma identified. No organized fluid collection. Musculoskeletal: Right hip subcutaneus soft tissue hematoma formation. No acute pelvic fracture. No spinal fracture. Multilevel severe degenerative changes of the spine. 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 Partially visualized total bilateral hip arthroplasty. Ports and Devices: None. IMPRESSION: 1. Right hip subcutaneus soft tissue hematoma formation. 2. No acute intrathoracic, intra-abdominal, intrapelvic traumatic injury. 3. No acute fracture or traumatic malalignment of the thoracic or lumbar spine. Other imaging findings of potential clinical significance: 1. Cholelithiasis with no CT evidence of acute cholecystitis. 2. Stable ascending thoracic aorta (4.3 cm). Recommend annual imaging followup by CTA or MRA. This recommendation follows 2010 ACCF/AHA/AATS/ACR/ASA/SCA/SCAI/SIR/STS/SVM Guidelines for the Diagnosis and Management of Patients with Thoracic Aortic Disease. Circulation. 2010; 121: Z610-R604. Aortic aneurysm NOS (ICD10-I71.9). These results were called by telephone at the time of interpretation on 09/30/2023 at 1:45 am to provider Dr. Cliffton Asters, Who verbally acknowledged these results. Electronically Signed   By: Tish Frederickson M.D.   On: 09/30/2023 02:00   CT HEAD WO CONTRAST Result Date: 09/30/2023 CLINICAL DATA:  Head trauma, moderate-severe; Polytrauma, blunt EXAM: CT HEAD WITHOUT CONTRAST CT CERVICAL SPINE WITHOUT CONTRAST TECHNIQUE: Multidetector CT imaging of the head and cervical spine was performed following the standard protocol without intravenous contrast. Multiplanar CT image reconstructions of the cervical spine were also generated. RADIATION DOSE REDUCTION: This exam was performed according to the departmental  dose-optimization program which includes automated exposure control, adjustment of the mA and/or kV according to patient size and/or use of iterative reconstruction technique. COMPARISON:  CT head and C-spine 09/27/2023 FINDINGS: CT HEAD FINDINGS Brain: Stable prominence of the lateral ventricles may be related to central predominant atrophy, although a component of normal pressure/communicating hydrocephalus cannot be excluded. Patchy and confluent areas of decreased attenuation are noted throughout the deep and periventricular white matter of the cerebral hemispheres bilaterally, compatible with chronic microvascular ischemic disease. No evidence of large-territorial acute infarction. No parenchymal hemorrhage. No mass lesion. No extra-axial collection. No mass effect or midline shift. No hydrocephalus. Basilar cisterns are patent. Vascular: No hyperdense vessel. Skull: No acute fracture or focal lesion. Sinuses/Orbits: Left maxillary sinus mucosal thickening. Paranasal sinuses and mastoid air cells are clear. The orbits are unremarkable. Other: None. CT CERVICAL SPINE FINDINGS Alignment: Normal. Skull base and vertebrae: Multilevel degenerative changes spine most prominent at the C5 through C7 levels. No associated severe osseous neural foraminal or central canal stenosis. No acute fracture. No aggressive appearing focal osseous lesion or focal pathologic process. Soft tissues and spinal canal: No prevertebral fluid or swelling. No visible canal hematoma. Upper chest: Unremarkable. Other: Atherosclerotic plaque of the carotid arteries within the neck. IMPRESSION: 1. No acute intracranial abnormality. 2. No acute displaced fracture or traumatic listhesis of the cervical spine. 3. Stable prominence of the lateral ventricles may be related to central predominant atrophy, although a component of normal pressure/communicating hydrocephalus cannot be excluded. These results were called by telephone at the time of  interpretation on 09/30/2023 at 1:45 am to provider Dr. Cliffton Asters, Who verbally acknowledged these results. Electronically Signed   By: Normajean Glasgow.D.  On: 09/30/2023 01:53   CT CERVICAL SPINE WO CONTRAST Result Date: 09/30/2023 CLINICAL DATA:  Head trauma, moderate-severe; Polytrauma, blunt EXAM: CT HEAD WITHOUT CONTRAST CT CERVICAL SPINE WITHOUT CONTRAST TECHNIQUE: Multidetector CT imaging of the head and cervical spine was performed following the standard protocol without intravenous contrast. Multiplanar CT image reconstructions of the cervical spine were also generated. RADIATION DOSE REDUCTION: This exam was performed according to the departmental dose-optimization program which includes automated exposure control, adjustment of the mA and/or kV according to patient size and/or use of iterative reconstruction technique. COMPARISON:  CT head and C-spine 09/27/2023 FINDINGS: CT HEAD FINDINGS Brain: Stable prominence of the lateral ventricles may be related to central predominant atrophy, although a component of normal pressure/communicating hydrocephalus cannot be excluded. Patchy and confluent areas of decreased attenuation are noted throughout the deep and periventricular white matter of the cerebral hemispheres bilaterally, compatible with chronic microvascular ischemic disease. No evidence of large-territorial acute infarction. No parenchymal hemorrhage. No mass lesion. No extra-axial collection. No mass effect or midline shift. No hydrocephalus. Basilar cisterns are patent. Vascular: No hyperdense vessel. Skull: No acute fracture or focal lesion. Sinuses/Orbits: Left maxillary sinus mucosal thickening. Paranasal sinuses and mastoid air cells are clear. The orbits are unremarkable. Other: None. CT CERVICAL SPINE FINDINGS Alignment: Normal. Skull base and vertebrae: Multilevel degenerative changes spine most prominent at the C5 through C7 levels. No associated severe osseous neural foraminal or central  canal stenosis. No acute fracture. No aggressive appearing focal osseous lesion or focal pathologic process. Soft tissues and spinal canal: No prevertebral fluid or swelling. No visible canal hematoma. Upper chest: Unremarkable. Other: Atherosclerotic plaque of the carotid arteries within the neck. IMPRESSION: 1. No acute intracranial abnormality. 2. No acute displaced fracture or traumatic listhesis of the cervical spine. 3. Stable prominence of the lateral ventricles may be related to central predominant atrophy, although a component of normal pressure/communicating hydrocephalus cannot be excluded. These results were called by telephone at the time of interpretation on 09/30/2023 at 1:45 am to provider Dr. Cliffton Asters, Who verbally acknowledged these results. Electronically Signed   By: Tish Frederickson M.D.   On: 09/30/2023 01:53   DG Pelvis Portable Result Date: 09/30/2023 CLINICAL DATA:  Trauma EXAM: PORTABLE PELVIS 1-2 VIEWS COMPARISON:  CT abdomen pelvis 09/30/2023, x-ray left femur 09/27/2023 FINDINGS: Bilateral total hip arthroplasty partially visualized. No radiographic findings suggest surgical hardware complication. There is no evidence of pelvic fracture or diastasis. No pelvic bone lesions are seen. Vascular calcification. IMPRESSION: Negative for acute traumatic injury. Electronically Signed   By: Tish Frederickson M.D.   On: 09/30/2023 01:49   DG Chest Port 1 View Result Date: 09/30/2023 CLINICAL DATA:  Trauma EXAM: PORTABLE CHEST 1 VIEW COMPARISON:  CT chest 09/30/2023. chest x-ray 09/27/2023 FINDINGS: Wireless cardiac device overlies the left chest. Coronary artery stents. The heart and mediastinal contours are unchanged. No focal consolidation. No pulmonary edema. No pleural effusion. No pneumothorax. No acute osseous abnormality.  Chronic fractured sternotomy wires. IMPRESSION: No active disease. These results were called by telephone at the time of interpretation on 09/30/2023 at 1:45 am to  provider Dr. Cliffton Asters, Who verbally acknowledged these results. Electronically Signed   By: Tish Frederickson M.D.   On: 09/30/2023 01:49    Anti-infectives: Anti-infectives (From admission, onward)    None        Assessment/Plan  72yoM s/p fall on Xarelto   Acute blood loss anemia - as per primary; Had CBC 12/24 with hgb 10. Last h/h  overall stable at 7.4. CBC pending L hip and thigh subQ hematoma - monitor, ice prn. hold Xarelto for time being and monitor ABLA as above. Recommend PT/OT L knee contusion - ice prn. Recc PT/OT ETOH use with Wernicke encephalopathy - CIWA ordered per primary Afib on xarelto - would recommend discussion about coming off this agent given 2 falls in 2 days documented here   Tertiary Survey: Trauma scans reviewed: Yes Additional Imaging recommendations: No Labs reviewed: Yes Additional lab work recommendations: No  Does the patient have any new complaints? No Cervical Collar Cleared: Yes DVT ppx ordered? No - hold in setting of ABLA  Wound Care: local wound care with bacitracin for scattered abrasions  Follow-up Appointments: Will need staple removal from scalp laceration in 7 days Will need suture removal from facial laceration in 5 days  Frailty score unable to be completed due to patient being nonverbal   We will sign off but please do not hesitate to contact us with any questions or concerns or reconsult if needed.   I reviewed ED provider notes, last 24 h vitals and pain scores, last 48 h intake and output, last 24 h labs and trends, and last 24 h imaging results.     LOS: 0 days   Eric Form, [facility] Surgery 09/30/2023, 8:38 AM Please see Amion for pager number during day hours 7:00am-4:30pm

## 2023-09-30 NOTE — ED Notes (Addendum)
Pt is mute and communicates by putting thums up or down. I gave pt a pen to write to communicate, but his handwriting is hard to decipher

## 2023-09-30 NOTE — ED Triage Notes (Addendum)
Arrives [facility] as a level 1 fall on blood thinners coming from home.  Eliquis.   Per paramedics pt had fallen in living room striking head on nearby table. Bleeding controlled. Unmeasured laceration to right temple.   Reported by paramedics that pt seen in ED 2 days ago for a fall at home. Has pre-existing staples from a previous fall.   Nonverbal at baseline but able to give thumbs up and down. Reported that patient is at baseline.   Initial sbp 70 palpated upon paramedic arrival. 500cc NS administered with improvements to 106 palpated.   C-collar present and in correct alignment.   Bruising to bilateral flanks and left knee.   Undisclosed alcohol consumption prior to arrival.

## 2023-09-30 NOTE — Assessment & Plan Note (Signed)
Blood pressure have been soft Continue IVF Check orthostatic  Hold oral meds for now

## 2023-09-30 NOTE — Assessment & Plan Note (Addendum)
Unwitnessed fall in setting of alcohol abuse/wernicke encephalopathy  Head CT with no acute findings on eliquis  Neurology had wanted MRI, has ILR in place. Consider MRI  Denies any chest pain, EKG pending. Check troponin Hgb low, check orthostatics. Transfuse 1 unit PRBC  Frequent falls, wife can not care for him: SW/PT consult  Fall precautions  Holding eliquis, would stop permanently  1 nylon suture placed in ED to right cheek

## 2023-09-30 NOTE — ED Notes (Signed)
Pt cleaned, dry linen and gown placed.

## 2023-09-30 NOTE — Transitions of Care (Post Inpatient/ED Visit) (Signed)
   09/30/2023  Name: Adam [name] MRN: [id number] DOB: 12-22-1950  Attempted to contact patient to complete TOC. Patient is currently admitted due another fall.    Emlyn Maves, CMA  [facility] AWV Team Direct Dial: [phone]

## 2023-09-30 NOTE — Progress Notes (Signed)
Transition of Care [facility]) - CAGE-AID Screening   Patient Details  Name: Adam [name] MRN: [id number] Date of Birth: Nov 23, 1950  [facility] CM/SW Contact:    Katha Hamming, RN Phone Number: 09/30/2023, 5:14 AM    CAGE-AID Screening:    Have You Ever Felt You Ought to Cut Down on Your Drinking or Drug Use?: No Have People Annoyed You By Critizing Your Drinking Or Drug Use?: No Have You Felt Bad Or Guilty About Your Drinking Or Drug Use?: No Have You Ever Had a Drink or Used Drugs First Thing In The Morning to Steady Your Nerves or to Get Rid of a Hangover?: Yes CAGE-AID Score: 1

## 2023-09-30 NOTE — Assessment & Plan Note (Addendum)
72 year old male presenting to ED after unwitnessed fall with head injury on eliquis in setting of alcohol use and progressive neurological disorder with pseudobulbar palsy, aphasia and wernickes encephalopathy found to have acute on chronic anemia down to hgb 7.1  -admit to progressive  -baseline hgb appears to be around 11-12, but needed 2 units back in spring for anemia -will give one unit PRBC now with  hgb less than 8  in setting of CAD and fall and orthostasis  -likely due to large hematoma  -start PPI IV with drinking history  -check fecal occult at risk for GI bleed in setting of alcohol abuse, unsure about dark stools  -trend CBC  -iron studies  -check orthostatic vitals  -hold eliquis for today

## 2023-09-30 NOTE — Assessment & Plan Note (Addendum)
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  EKG/troponin pending

## 2023-09-30 NOTE — Assessment & Plan Note (Addendum)
-  followed by neurology, recently prescribed  Nuedexta in November of this year  -continues to drink, worsening dysphagia: palliative care  -consider MRI brain, but has ILR in place -discussed GOC with wife. She can no longer provide care for him at home. SW consult placed -He has DNR order at home, but is telling me is a full code here. Again, GOC need to be discussed further, palliative consult pending

## 2023-09-30 NOTE — Plan of Care (Signed)
Adam [name] is a 72 y.o. 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  Upon arrival EMS found patient on the floor, had a unwitnessed fall earlier in the evening.  Patient had a fall 2 days ago evaluated in the ED then discharged.  EMS also found patient is hypotensive blood pressure low to 80s which improved to upper 100 after 500 mL of NS bolus.  Patient is nonverbal but able to follow command but history limited to nonverbal status at baseline.  In the ED patient has been evaluated by general surgery Dr. Cliffton Asters regarding left-sided hip hematoma.  Recommending to hold the Xarelto for the time being given hemoglobin is 8.  Also recommended discussion about coming of 8 blood thinner given there is 2 falls in last 2 days.  Patient is still normotensive blood pressure 99/54, heart rate 87 O2 sat 100% room air. CMP showing low potassium 3.2, low bicarb 21, and low albumin 3.1. CBC showing low hemoglobin 8 (unknown baseline). Elevated blood alcohol level 30. Elevated pro time INR 16 and 1.3 Normal CK. Elevated lactic acid 4.1.  Chest x-ray no active disease process. X-ray pelvis unremarkable. X-ray femur no evidence of fracture or dislocation.  Osteopenia.  Heavy vascular calcification..  CT head showed no acute intracranial abnormality.. Stable prominence of the lateral ventricles may be related to central predominant atrophy, although a component of normal pressure/communicating hydrocephalus cannot be excluded.   CT cervical spine no acute fracture or dislocation.  CT chest, abdomen pelvis: 1. Right hip subcutaneus soft tissue hematoma  formation. 2. No acute intrathoracic, intra-abdominal, intrapelvic traumatic injury. 3. No acute fracture or traumatic malalignment of the thoracic or lumbar spine.  Other imaging findings of potential clinical significance:  1. Cholelithiasis with no CT evidence of acute cholecystitis. 2. Stable ascending thoracic aorta (4.3 cm). Recommend annual imaging followup by CTA or MRA. This recommendation follows 2010 ACCF/AHA/AATS/ACR/ASA/SCA/SCAI/SIR/STS/SVM Guidelines for the Diagnosis and Management of Patients with Thoracic Aortic Disease. Circulation. 2010; 121: Z610-R604. Aortic aneurysm NOS (ICD10-I71.9).   ED physician Dr. Wallace Cullens reported that patient does not have any significant thigh hematoma however hemoglobin dropped significantly baseline around 12 now it is 8.  Patient also found initially hypotensive and elevated lactic acid.  Also patient is actively drinking and Ativan as needed has been started in the ED.  Hospitalist has been contacted for further management of acute onset anemia in the setting of thigh hematoma and workup for hypotension and lactic acidosis.

## 2023-09-30 NOTE — Assessment & Plan Note (Signed)
Continue crestor 

## 2023-09-30 NOTE — ED Notes (Signed)
Patient transported to CT 

## 2023-09-30 NOTE — Discharge Instructions (Signed)

## 2023-09-30 NOTE — ED Notes (Signed)
 Central telemetry notified for cardiac monitoring.

## 2023-09-30 NOTE — Assessment & Plan Note (Addendum)
Likely secondary to # 6 problem  Lost 100 pounds over last 1.5 years  MBSS attempted last month, but aborted as  he could not complete Many discussions about G tube, but he has been adamant that he does not want this.  NPO until SLP can see Has been able to swallow his pills with yogurt at home High risk of aspiration, precautions given  Palliative care consult

## 2023-09-30 NOTE — Assessment & Plan Note (Signed)
Check magnesium Replete and trend  

## 2023-09-30 NOTE — ED Provider Notes (Signed)
[place] EMERGENCY DEPARTMENT AT [facility] Provider Note  CSN: [id number] Arrival date & time: 09/30/23 0119  Chief Complaint(s) No chief complaint on file.  HPI Adam [name] is a 72 y.o. male with past medical history as below, significant for afib on eliquis, COPD, etoh abuse, nonverbal baseline, wernicke's encephalopathy, thoracic aneurysm who presents to the ED with complaint of level 1 FOT w/ head injury, hypotensive PTA  Pt arrives via EMS, unwitnessed FOT earlier this evening, had fall 2 days ago and was evaluated in ED at that time and discharged in stable condition.   Per EMS hypotensive 80's on arrival, did improve with bolus to SBP >100. He is non verbal but can follow commands but history is limited 2/2 nonverbal state (baseline)  Past Medical History History reviewed. No pertinent past medical history. There are no active problems to display for this patient.  Home Medication(s) Prior to Admission medications   Medication Sig Start Date End Date Taking? Authorizing Provider  allopurinol (ZYLOPRIM) 300 MG tablet Take 300 mg by mouth daily. 09/10/23  Yes [provider]  amiodarone (PACERONE) 200 MG tablet Take 200 mg by mouth daily. 09/13/23  Yes [provider]  DULoxetine (CYMBALTA) 60 MG capsule Take 60 mg by mouth daily. 08/22/23  Yes [provider]  ELIQUIS 5 MG TABS tablet Take 5 mg by mouth 2 (two) times daily. 08/05/23  Yes [provider]  gabapentin (NEURONTIN) 300 MG capsule Take 600 mg by mouth at bedtime as needed. 08/29/23  Yes [provider]  ipratropium (ATROVENT) 0.03 % nasal spray Place 2 sprays into both nostrils 3 (three) times daily. 08/29/23  Yes [provider]  lisinopril (ZESTRIL) 20 MG tablet Take 20 mg by mouth daily. 09/29/23  Yes [provider]  metoprolol tartrate (LOPRESSOR) 50 MG tablet Take 50 mg by mouth 2 (two) times daily. 08/22/23  Yes [provider]  NUEDEXTA 20-10 MG capsule Take 1 capsule by mouth daily. 08/31/23  Yes [provider]  PREVIDENT 5000 ENAMEL PROTECT 1.1-5 % GEL USE TO BRUSH TEETH FOR 2 MINUTES TWICE DAILY THEN SPIT OUT AFTER. NO FOOD OR DRINK FOR 30 MINUTES AFTER 04/11/23  Yes [provider]  rosuvastatin (CRESTOR) 40 MG tablet Take 40 mg by mouth daily. 09/10/23  Yes [provider]  SKYRIZI PEN 150 MG/ML pen Inject into the skin. 08/23/23  Yes [provider]  SYNJARDY XR 25-1000 MG TB24 Take 1 tablet by mouth daily. 09/12/23  Yes [provider]                                                                                                                                    Past Surgical History History reviewed. No pertinent surgical history. Family History History reviewed. No pertinent family history.  Social History Social History   Tobacco Use   Smoking status: Never  Smokeless tobacco: Never   Allergies Patient has no known allergies.  Review of Systems Review of Systems  Physical Exam Vital Signs  I have reviewed the triage vital signs BP (!) 102/56   Pulse 89   Temp (!) 96.8 F (36 C) (Temporal)   Resp 15   Ht 5\' 10"  (1.778 m)   Wt 68 kg   SpO2 100%   BMI 21.52 kg/m  Physical Exam Vitals and nursing note reviewed.  Constitutional:      General: He is not in acute distress.    Appearance: Normal appearance. He is well-developed.  HENT:     Head: Normocephalic.     Jaw: There is normal jaw occlusion.      Right Ear: External ear normal.     Left Ear: External ear normal.     Mouth/Throat:     Mouth: Mucous membranes are moist.  Eyes:     General: No scleral icterus.    Extraocular Movements: Extraocular movements intact.     Pupils: Pupils are equal, round, and reactive to light.     Comments: Pupils 3mm reactive b/l  Cardiovascular:     Rate and Rhythm: Normal rate and regular rhythm.     Pulses: Normal pulses.      Heart sounds: Normal heart sounds.  Pulmonary:     Effort: Pulmonary effort is normal. No respiratory distress.     Breath sounds: Normal breath sounds.  Abdominal:     General: Abdomen is flat.     Palpations: Abdomen is soft.     Tenderness: There is no abdominal tenderness.  Musculoskeletal:       Arms:     Cervical back: No rigidity.     Right lower leg: No edema.     Left lower leg: No edema.       Legs:     Comments: No midline spinous process tenderness to palpation or percussion, no crepitus or step-off.  Rectal tone is intact.    Skin:    General: Skin is warm and dry.     Capillary Refill: Capillary refill takes less than 2 seconds.  Neurological:     Mental Status: He is alert.     GCS: GCS eye subscore is 4. GCS verbal subscore is 1. GCS motor subscore is 6.     Cranial Nerves: Cranial nerves 2-12 are intact. No facial asymmetry.     Sensory: Sensation is intact. No sensory deficit.     Motor: Motor function is intact. No tremor.     Coordination: Coordination is intact.     Comments: Gait testing deferred secondary to patient safety.  Strength 5/5 to BLUE/BLLE, equal and symmetric    Psychiatric:        Mood and Affect: Mood normal.        Behavior: Behavior normal.     ED Results and Treatments Labs (all labs ordered are listed, but only abnormal results are displayed) Labs Reviewed  COMPREHENSIVE METABOLIC PANEL - Abnormal; Notable for the following components:      Result Value   Potassium 3.2 (*)    CO2 21 (*)    Glucose, Bld 113 (*)    Total Protein 5.8 (*)    Albumin 3.1 (*)    AST 49 (*)    All other components within normal limits  CBC - Abnormal; Notable for the following components:   RBC 2.46 (*)    Hemoglobin 8.0 (*)    HCT 24.4 (*)  All other components within normal limits  ETHANOL - Abnormal; Notable for the following components:   Alcohol, Ethyl (B) 30 (*)    All other components within normal limits  PROTIME-INR - Abnormal;  Notable for the following components:   Prothrombin Time 16.1 (*)    INR 1.3 (*)    All other components within normal limits  I-STAT CHEM 8, ED - Abnormal; Notable for the following components:   Potassium 3.1 (*)    Glucose, Bld 108 (*)    Calcium, Ion 1.05 (*)    TCO2 21 (*)    Hemoglobin 7.1 (*)    HCT 21.0 (*)    All other components within normal limits  I-STAT CG4 LACTIC ACID, ED - Abnormal; Notable for the following components:   Lactic Acid, Venous 4.1 (*)    All other components within normal limits  CK  URINALYSIS, ROUTINE W REFLEX MICROSCOPIC  VITAMIN B12  FOLATE  IRON AND TIBC  FERRITIN  RETICULOCYTES  LACTIC ACID, PLASMA  HEMOGLOBIN AND HEMATOCRIT, BLOOD  SAMPLE TO BLOOD BANK                                                                                                                          Radiology DG Femur Min 2 Views Left Result Date: 09/30/2023 CLINICAL DATA:  Level 1 trauma patient, fall injury. EXAM: LEFT FEMUR 2 VIEWS; LEFT KNEE - COMPLETE 4+ VIEW COMPARISON:  Left femoral and left knee series from 3 days ago 09/27/2023 FINDINGS: AP and lateral left femur: Left hip arthroplasty with cemented femoral component without visible evidence of loosening. There is osteopenia without evidence for fractures or other focal bone lesions. No focal soft tissue swelling is seen. There is artifact from overlying clothing. There is contrast in the bladder and the distal left ureter. There is heavy vascular calcification. Left knee, four views: Small suprapatellar bursal effusion is unchanged. There is osteopenia without evidence of fracture or dislocation. Tricompartmental joint narrowing and osteophytosis is seen, greatest narrowing in the medial femorotibial joint Superficial soft tissues are unremarkable. The popliteal artery and trifurcation arteries are heavily calcified. IMPRESSION: 1. No evidence of fractures or other new findings in the left femur or left knee. Small  suprapatellar bursal effusion is unchanged. 2. Osteopenia and degenerative change. 3. Heavy vascular calcification. Electronically Signed   By: Almira Bar M.D.   On: 09/30/2023 02:45   DG Knee Complete 4 Views Left Result Date: 09/30/2023 CLINICAL DATA:  Level 1 trauma patient, fall injury. EXAM: LEFT FEMUR 2 VIEWS; LEFT KNEE - COMPLETE 4+ VIEW COMPARISON:  Left femoral and left knee series from 3 days ago 09/27/2023 FINDINGS: AP and lateral left femur: Left hip arthroplasty with cemented femoral component without visible evidence of loosening. There is osteopenia without evidence for fractures or other focal bone lesions. No focal soft tissue swelling is seen. There is artifact from overlying clothing. There is contrast in the bladder and the distal left ureter. There  is heavy vascular calcification. Left knee, four views: Small suprapatellar bursal effusion is unchanged. There is osteopenia without evidence of fracture or dislocation. Tricompartmental joint narrowing and osteophytosis is seen, greatest narrowing in the medial femorotibial joint Superficial soft tissues are unremarkable. The popliteal artery and trifurcation arteries are heavily calcified. IMPRESSION: 1. No evidence of fractures or other new findings in the left femur or left knee. Small suprapatellar bursal effusion is unchanged. 2. Osteopenia and degenerative change. 3. Heavy vascular calcification. Electronically Signed   By: Almira Bar M.D.   On: 09/30/2023 02:45   CT CHEST ABDOMEN PELVIS W CONTRAST Result Date: 09/30/2023 CLINICAL DATA:  Polytrauma, blunt EXAM: CT CHEST, ABDOMEN, AND PELVIS WITH CONTRAST TECHNIQUE: Multidetector CT imaging of the chest, abdomen and pelvis was performed following the standard protocol during bolus administration of intravenous contrast. RADIATION DOSE REDUCTION: This exam was performed according to the departmental dose-optimization program which includes automated exposure control, adjustment  of the mA and/or kV according to patient size and/or use of iterative reconstruction technique. CONTRAST:  75mL OMNIPAQUE IOHEXOL 350 MG/ML SOLN COMPARISON:  CT angio chest 06/17/2022, ultrasound renal 02/12/2023, x-ray pelvis 09/30/2023 FINDINGS: CHEST: Cardiovascular: No aortic injury. 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 Four-vessel coronary calcification. Status post coronary bypass graft and stents. The heart is normal in size. No significant pericardial effusion. Mediastinum/Nodes: No pneumomediastinum. No mediastinal hematoma. The esophagus is unremarkable. The thyroid is unremarkable. The central airways are patent. No mediastinal, hilar, or axillary lymphadenopathy. Lungs/Pleura: No focal consolidation. No pulmonary nodule. No pulmonary mass. No pulmonary contusion or laceration. No pneumatocele formation. Chronic right rib fractures. No pleural effusion. No pneumothorax. No hemothorax. Musculoskeletal/Chest wall: No chest wall mass. No acute rib or sternal fracture. No spinal fracture. Multilevel degenerative changes of the spine. Multiple chronically fractured sternotomy wires. ABDOMEN / PELVIS: Hepatobiliary: Not enlarged. No focal lesion. No laceration or subcapsular hematoma. Calcified gallstones within the gallbladder lumen. No gallbladder wall thickening or pericholecystic fluid. No biliary ductal dilatation. Pancreas: Normal pancreatic contour. No main pancreatic duct dilatation. Spleen: Not enlarged. No focal lesion. No laceration, subcapsular hematoma, or vascular injury. Adrenals/Urinary Tract: No nodularity bilaterally. Bilateral kidneys enhance symmetrically. No hydronephrosis. No contusion, laceration, or subcapsular hematoma. No injury to the vascular structures or collecting systems. No hydroureter. The urinary bladder is unremarkable. Stomach/Bowel: No small or large bowel wall thickening or dilatation. The appendix is not definitely identified with no  inflammatory changes in the right lower quadrant to suggest acute appendicitis. Vasculature/Lymphatics: No abdominal aorta or iliac aneurysm. No active contrast extravasation or pseudoaneurysm. No abdominal, pelvic, inguinal lymphadenopathy. Reproductive: Normal. Other: No simple free fluid ascites. No pneumoperitoneum. No hemoperitoneum. No mesenteric hematoma identified. No organized fluid collection. Musculoskeletal: Right hip subcutaneus soft tissue hematoma formation. No acute pelvic fracture. No spinal fracture. Multilevel severe degenerative changes of the spine. 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 Partially visualized total bilateral hip arthroplasty. Ports and Devices: None. IMPRESSION: 1. Right hip subcutaneus soft tissue hematoma formation. 2. No acute intrathoracic, intra-abdominal, intrapelvic traumatic injury. 3. No acute fracture or traumatic malalignment of the thoracic or lumbar spine. Other imaging findings of potential clinical significance: 1. Cholelithiasis with no CT evidence of acute cholecystitis. 2. Stable ascending thoracic aorta (4.3 cm). Recommend annual imaging followup by CTA or MRA. This recommendation follows 2010 ACCF/AHA/AATS/ACR/ASA/SCA/SCAI/SIR/STS/SVM Guidelines for the Diagnosis and Management of Patients with Thoracic Aortic Disease. Circulation. 2010; 121: Z610-R604. Aortic aneurysm NOS (ICD10-I71.9).  These results were called by telephone at the time of interpretation on 09/30/2023 at 1:45 am to provider Dr. Cliffton Asters, Who verbally acknowledged these results. Electronically Signed   By: Tish Frederickson M.D.   On: 09/30/2023 02:00   CT HEAD WO CONTRAST Result Date: 09/30/2023 CLINICAL DATA:  Head trauma, moderate-severe; Polytrauma, blunt EXAM: CT HEAD WITHOUT CONTRAST CT CERVICAL SPINE WITHOUT CONTRAST TECHNIQUE: Multidetector CT imaging of the head and cervical spine was performed following the standard protocol  without intravenous contrast. Multiplanar CT image reconstructions of the cervical spine were also generated. RADIATION DOSE REDUCTION: This exam was performed according to the departmental dose-optimization program which includes automated exposure control, adjustment of the mA and/or kV according to patient size and/or use of iterative reconstruction technique. COMPARISON:  CT head and C-spine 09/27/2023 FINDINGS: CT HEAD FINDINGS Brain: Stable prominence of the lateral ventricles may be related to central predominant atrophy, although a component of normal pressure/communicating hydrocephalus cannot be excluded. Patchy and confluent areas of decreased attenuation are noted throughout the deep and periventricular white matter of the cerebral hemispheres bilaterally, compatible with chronic microvascular ischemic disease. No evidence of large-territorial acute infarction. No parenchymal hemorrhage. No mass lesion. No extra-axial collection. No mass effect or midline shift. No hydrocephalus. Basilar cisterns are patent. Vascular: No hyperdense vessel. Skull: No acute fracture or focal lesion. Sinuses/Orbits: Left maxillary sinus mucosal thickening. Paranasal sinuses and mastoid air cells are clear. The orbits are unremarkable. Other: None. CT CERVICAL SPINE FINDINGS Alignment: Normal. Skull base and vertebrae: Multilevel degenerative changes spine most prominent at the C5 through C7 levels. No associated severe osseous neural foraminal or central canal stenosis. No acute fracture. No aggressive appearing focal osseous lesion or focal pathologic process. Soft tissues and spinal canal: No prevertebral fluid or swelling. No visible canal hematoma. Upper chest: Unremarkable. Other: Atherosclerotic plaque of the carotid arteries within the neck. IMPRESSION: 1. No acute intracranial abnormality. 2. No acute displaced fracture or traumatic listhesis of the cervical spine. 3. Stable prominence of the lateral ventricles may  be related to central predominant atrophy, although a component of normal pressure/communicating hydrocephalus cannot be excluded. These results were called by telephone at the time of interpretation on 09/30/2023 at 1:45 am to provider Dr. Cliffton Asters, Who verbally acknowledged these results. Electronically Signed   By: Tish Frederickson M.D.   On: 09/30/2023 01:53   CT CERVICAL SPINE WO CONTRAST Result Date: 09/30/2023 CLINICAL DATA:  Head trauma, moderate-severe; Polytrauma, blunt EXAM: CT HEAD WITHOUT CONTRAST CT CERVICAL SPINE WITHOUT CONTRAST TECHNIQUE: Multidetector CT imaging of the head and cervical spine was performed following the standard protocol without intravenous contrast. Multiplanar CT image reconstructions of the cervical spine were also generated. RADIATION DOSE REDUCTION: This exam was performed according to the departmental dose-optimization program which includes automated exposure control, adjustment of the mA and/or kV according to patient size and/or use of iterative reconstruction technique. COMPARISON:  CT head and C-spine 09/27/2023 FINDINGS: CT HEAD FINDINGS Brain: Stable prominence of the lateral ventricles may be related to central predominant atrophy, although a component of normal pressure/communicating hydrocephalus cannot be excluded. Patchy and confluent areas of decreased attenuation are noted throughout the deep and periventricular white matter of the cerebral hemispheres bilaterally, compatible with chronic microvascular ischemic disease. No evidence of large-territorial acute infarction. No parenchymal hemorrhage. No mass lesion. No extra-axial collection. No mass effect or midline shift. No hydrocephalus. Basilar cisterns are patent. Vascular: No hyperdense vessel. Skull: No acute fracture or focal lesion. Sinuses/Orbits: Left maxillary  sinus mucosal thickening. Paranasal sinuses and mastoid air cells are clear. The orbits are unremarkable. Other: None. CT CERVICAL SPINE  FINDINGS Alignment: Normal. Skull base and vertebrae: Multilevel degenerative changes spine most prominent at the C5 through C7 levels. No associated severe osseous neural foraminal or central canal stenosis. No acute fracture. No aggressive appearing focal osseous lesion or focal pathologic process. Soft tissues and spinal canal: No prevertebral fluid or swelling. No visible canal hematoma. Upper chest: Unremarkable. Other: Atherosclerotic plaque of the carotid arteries within the neck. IMPRESSION: 1. No acute intracranial abnormality. 2. No acute displaced fracture or traumatic listhesis of the cervical spine. 3. Stable prominence of the lateral ventricles may be related to central predominant atrophy, although a component of normal pressure/communicating hydrocephalus cannot be excluded. These results were called by telephone at the time of interpretation on 09/30/2023 at 1:45 am to provider Dr. Cliffton Asters, Who verbally acknowledged these results. Electronically Signed   By: Tish Frederickson M.D.   On: 09/30/2023 01:53   DG Pelvis Portable Result Date: 09/30/2023 CLINICAL DATA:  Trauma EXAM: PORTABLE PELVIS 1-2 VIEWS COMPARISON:  CT abdomen pelvis 09/30/2023, x-ray left femur 09/27/2023 FINDINGS: Bilateral total hip arthroplasty partially visualized. No radiographic findings suggest surgical hardware complication. There is no evidence of pelvic fracture or diastasis. No pelvic bone lesions are seen. Vascular calcification. IMPRESSION: Negative for acute traumatic injury. Electronically Signed   By: Tish Frederickson M.D.   On: 09/30/2023 01:49   DG Chest Port 1 View Result Date: 09/30/2023 CLINICAL DATA:  Trauma EXAM: PORTABLE CHEST 1 VIEW COMPARISON:  CT chest 09/30/2023. chest x-ray 09/27/2023 FINDINGS: Wireless cardiac device overlies the left chest. Coronary artery stents. The heart and mediastinal contours are unchanged. No focal consolidation. No pulmonary edema. No pleural effusion. No pneumothorax. No  acute osseous abnormality.  Chronic fractured sternotomy wires. IMPRESSION: No active disease. These results were called by telephone at the time of interpretation on 09/30/2023 at 1:45 am to provider Dr. Cliffton Asters, Who verbally acknowledged these results. Electronically Signed   By: Tish Frederickson M.D.   On: 09/30/2023 01:49    Pertinent labs & imaging results that were available during my care of the patient were reviewed by me and considered in my medical decision making (see MDM for details).  Medications Ordered in ED Medications  LORazepam (ATIVAN) tablet 1-4 mg (has no administration in time range)    Or  LORazepam (ATIVAN) injection 1-4 mg (has no administration in time range)  thiamine (VITAMIN B1) tablet 100 mg (has no administration in time range)    Or  thiamine (VITAMIN B1) injection 100 mg (has no administration in time range)  folic acid (FOLVITE) tablet 1 mg (has no administration in time range)  multivitamin with minerals tablet 1 tablet (has no administration in time range)  potassium chloride SA (KLOR-CON M) CR tablet 40 mEq (has no administration in time range)  iohexol (OMNIPAQUE) 350 MG/ML injection 75 mL (75 mLs Intravenous Contrast Given 09/30/23 0147)  lidocaine-EPINEPHrine-tetracaine (LET) topical gel (3 mLs Topical Given 09/30/23 0314)  Procedures .Critical Care  Performed by: Sloan Leiter, DO Authorized by: Sloan Leiter, DO   Critical care provider statement:    Critical care time (minutes):  40   Critical care time was exclusive of:  Separately billable procedures and treating other patients   Critical care was necessary to treat or prevent imminent or life-threatening deterioration of the following conditions:  Trauma   Critical care was time spent personally by me on the following activities:  Development of treatment plan with  patient or surrogate, discussions with consultants, evaluation of patient's response to treatment, examination of patient, ordering and review of laboratory studies, ordering and review of radiographic studies, ordering and performing treatments and interventions, pulse oximetry, re-evaluation of patient's condition, review of old charts and obtaining history from patient or surrogate   Care discussed with: admitting provider   .Laceration Repair  Date/Time: 09/30/2023 4:04 AM  Performed by: Sloan Leiter, DO Authorized by: Sloan Leiter, DO   Consent:    Consent obtained:  Verbal   Consent given by:  Patient   Risks, benefits, and alternatives were discussed: yes     Risks discussed:  Need for additional repair, nerve damage, poor wound healing and poor cosmetic result   Alternatives discussed:  No treatment Universal protocol:    Immediately prior to procedure, a time out was called: yes     Patient identity confirmed:  Arm band Anesthesia:    Anesthesia method:  Topical application   Topical anesthetic:  LET Laceration details:    Location:  Face   Face location:  R cheek   Length (cm):  1   Depth (mm):  2 Pre-procedure details:    Preparation:  Patient was prepped and draped in usual sterile fashion and imaging obtained to evaluate for foreign bodies Exploration:    Limited defect created (wound extended): no     Hemostasis achieved with:  Direct pressure   Wound extent: areolar tissue not violated, fascia not violated, no foreign body, no signs of injury, no nerve damage, no tendon damage, no underlying fracture and no vascular damage     Contaminated: no   Treatment:    Area cleansed with:  Saline   Irrigation solution:  Sterile saline   Irrigation volume:  50   Irrigation method:  Syringe and tap   Debridement:  None   Undermining:  None Skin repair:    Repair method:  Sutures   Suture size:  5-0   Suture material:  Nylon   Suture technique:  Simple interrupted    Number of sutures:  1 Approximation:    Approximation:  Close Repair type:    Repair type:  Simple Post-procedure details:    Dressing:  Non-adherent dressing and antibiotic ointment   Procedure completion:  Tolerated well, no immediate complications   (including critical care time)  Medical Decision Making / ED Course    Medical Decision Making:    Adam [name] is a 72 y.o. male with past medical history as below, significant for afib on eliquis, COPD, etoh abuse, nonverbal baseline, wernicke's encephalopathy, thoracic aneurysm who presents to the ED with complaint of level 1 FOT w/ head injury, hypotensive PTA. The complaint involves an extensive differential diagnosis and also carries with it a high risk of complications and morbidity.  Serious etiology was considered. Ddx includes but is not limited to: Differential diagnoses for head trauma includes subdural hematoma, epidural hematoma, acute concussion, traumatic subarachnoid hemorrhage, cerebral contusions, etc.  Complete initial physical exam performed, notably the patient was in NAD, HDS, airway intact.    Reviewed and confirmed nursing documentation for past medical history, family history, social history.  Vital signs reviewed.    1* survery completed -Airway patent, trachea midline -Lungs CTAB, no hypoxia -Extremities warm well perfused, equal pulses to extremities -Neuro intact on exam other than non-verbal status, he is able to follow commands and give yes/no responses with hand gesture -hematoma noted left flank/left upper flank ribs/ knee left/ femur left -trauma doc at bedside on pt arrival Dr Cliffton Asters to assess Clinical Course as of 09/30/23 0509  Fri Sep 30, 2023  0152 Tetanus UTD [SG]  0228 Alcohol, Ethyl (B)(!): 30 Reports daily etoh use [SG]  0229 Hemoglobin(!): 8.0 Hgb 10.9 12/24 per chart review (pending chart merge)  [SG]  0413 Brown stool noted, no melena  [SG]  0501 Spoke with [facility] who accepts pt  for admit [SG]    Clinical Course User Index [SG] Sloan Leiter, DO    Brief summary: 72 year old male history of A-fib on DOAC, daily alcohol use here with fall on thinners with head injury.  Hit face on table.  Denies LOC.  Initially hypotensive but has since resolved.  Arrives as a level 1 trauma secondary to nonverbal, hypotension.  Patient is nonverbal at baseline.  Hypotension has resolved upon arrival to the ED after IV fluids by EMS.  Trauma scans reviewed and are stable.  X-ray stable.  Hemoglobin has dropped 3 g since 2 days ago, he is on DOAC.  No obvious source of bleeding.  He has multiple hematomas flank, thigh.  Recommend observation for serial H&H and serial compartment checks.  Hold DOAC for now.  Wound repaired at bedside.  Encourage alcohol cessation.  He was evaluated trauma team, see note from Dr. Cliffton Asters.  Admit to [facility].                Additional history obtained: -Additional history obtained from ems -External records from outside source obtained and reviewed including: Chart review including previous notes, labs, imaging, consultation notes including  Recent ED visit from chart merge   Lab Tests: -I ordered, reviewed, and interpreted labs.   The pertinent results include:   Labs Reviewed  COMPREHENSIVE METABOLIC PANEL - Abnormal; Notable for the following components:      Result Value   Potassium 3.2 (*)    CO2 21 (*)    Glucose, Bld 113 (*)    Total Protein 5.8 (*)    Albumin 3.1 (*)    AST 49 (*)    All other components within normal limits  CBC - Abnormal; Notable for the following components:   RBC 2.46 (*)    Hemoglobin 8.0 (*)    HCT 24.4 (*)    All other components within normal limits  ETHANOL - Abnormal; Notable for the following components:   Alcohol, Ethyl (B) 30 (*)    All other components within normal limits  PROTIME-INR - Abnormal; Notable for the following components:   Prothrombin Time 16.1 (*)    INR 1.3 (*)    All other  components within normal limits  I-STAT CHEM 8, ED - Abnormal; Notable for the following components:   Potassium 3.1 (*)    Glucose, Bld 108 (*)    Calcium, Ion 1.05 (*)    TCO2 21 (*)    Hemoglobin 7.1 (*)    HCT 21.0 (*)    All other components within normal  limits  I-STAT CG4 LACTIC ACID, ED - Abnormal; Notable for the following components:   Lactic Acid, Venous 4.1 (*)    All other components within normal limits  CK  URINALYSIS, ROUTINE W REFLEX MICROSCOPIC  VITAMIN B12  FOLATE  IRON AND TIBC  FERRITIN  RETICULOCYTES  LACTIC ACID, PLASMA  HEMOGLOBIN AND HEMATOCRIT, BLOOD  SAMPLE TO BLOOD BANK    Notable for as above  EKG   EKG Interpretation Date/Time:    Ventricular Rate:    PR Interval:    QRS Duration:    QT Interval:    QTC Calculation:   R Axis:      Text Interpretation:           Imaging Studies ordered: I ordered imaging studies including trauma scan, pelvis/chest/femur/knee XR I independently visualized the following imaging with scope of interpretation limited to determining acute life threatening conditions related to emergency care; findings noted above I independently visualized and interpreted imaging. I agree with the radiologist interpretation   Medicines ordered and prescription drug management: Meds ordered this encounter  Medications   iohexol (OMNIPAQUE) 350 MG/ML injection 75 mL   lidocaine-EPINEPHrine-tetracaine (LET) topical gel   OR Linked Order Group    LORazepam (ATIVAN) tablet 1-4 mg     CIWA-AR < 5 =:   0 mg     CIWA-AR 5 -10 =:   1 mg     CIWA-AR 11 -15 =:   2 mg     CIWA-AR 16 -20 =:   3 mg     CIWA-AR 16 -20 =:   Recheck CIWA-AR in 1 hour; if > 20 notify MD     CIWA-AR > 20 =:   4 mg     CIWA-AR > 20 =:   Call Rapid Response    LORazepam (ATIVAN) injection 1-4 mg     CIWA-AR < 5 =:   0 mg     CIWA-AR 5 -10 =:   1 mg     CIWA-AR 11 -15 =:   2 mg     CIWA-AR 16 -20 =:   3 mg     CIWA-AR 16 -20 =:   Recheck CIWA-AR in  1 hour; if > 20 notify MD     CIWA-AR > 20 =:   4 mg     CIWA-AR > 20 =:   Call Rapid Response   OR Linked Order Group    thiamine (VITAMIN B1) tablet 100 mg    thiamine (VITAMIN B1) injection 100 mg   folic acid (FOLVITE) tablet 1 mg   multivitamin with minerals tablet 1 tablet   potassium chloride SA (KLOR-CON M) CR tablet 40 mEq    -I have reviewed the patients home medicines and have made adjustments as needed   Consultations Obtained: I requested consultation with the trauma,  and discussed lab and imaging findings as well as pertinent plan - they recommend: obs o/n   Cardiac Monitoring: The patient was maintained on a cardiac monitor.  I personally viewed and interpreted the cardiac monitored which showed an underlying rhythm of: NSR Continuous pulse oximetry interpreted by myself, 99% on RA.    Social Determinants of Health:  Diagnosis or treatment significantly limited by social determinants of health: alcohol use   Reevaluation: After the interventions noted above, I reevaluated the patient and found that they have improved  Co morbidities that complicate the patient evaluation History reviewed. No pertinent past medical history.    Dispostion: Disposition  decision including need for hospitalization was considered, and patient admitted to the hospital.    Final Clinical Impression(s) / ED Diagnoses Final diagnoses:  Facial laceration, initial encounter  Alcohol abuse  Injury of head, initial encounter  Nonverbal  Anemia, unspecified type        Sloan Leiter, DO 09/30/23 4098

## 2023-09-30 NOTE — ED Notes (Addendum)
Pt's right has 2+ swelling and is ecchymotic. The right eye is almost swollen shut. Pt has abrasion to right knee. Pt's left knee has 2+ swelling and is ecchymotic. Pt has large ecchymotic area on left upper inner thigh. Pt's left forearm has scattered area of petechia. Pt's right forearm has some areas of petechia and posterior of right hand.

## 2023-09-30 NOTE — Progress Notes (Signed)
   09/30/23 0200  Spiritual Encounters  Type of Visit Initial  Care provided to: Patient  Conversation partners present during encounter Nurse  Referral source Trauma page  Reason for visit Trauma  OnCall Visit Yes   Chaplain responded to trauma level I page. There was no family present at bedside. Patient was unresponsive. Chaplain provided compassionate presence. No follow-up needed at this time.

## 2023-09-30 NOTE — Evaluation (Signed)
Clinical/Bedside Swallow Evaluation Patient Details  Name: Adam [name] MRN: [id number] Date of Birth: 28-Nov-1950  Today's Date: 09/30/2023 Time: SLP Start Time (ACUTE ONLY): 1400 SLP Stop Time (ACUTE ONLY): 1425 SLP Time Calculation (min) (ACUTE ONLY): 25 min   HPI:  Adam [name] is a 72 y.o. male with medical history significant of PAF on eliquis, HTN, HLD, CAD, T2DM, thoracic AA, OSA, alcohol abuse with hx of wernicke encephalopathy and aphasia, pseudobulbar palsy, non verbal state who presented to ED after a fall at home with head injury. He was hypotensive per EMS to 80s on arrival and improved with 500cc bolus to SBP >100. He was drinking. Able to write his left leg gave out and he fell down and hit his head. Lost 100 pounds over last 1.5 years   MBSS attempted last month, but aborted as  he could not complete  Many discussions about G tube, but he has been adamant that he does not want this.  There is a much more elaborate history that is not available until charts merge.    Assessment / Plan / Recommendation  Clinical Impression  Pt presents with profound dysphagia due to anathria. There is no lingual movement, no labial seal. Pt is able to move mandible. He is able to phonate, but doesnt cough. Pt communicates with gestures. Called wife who reports that pt has been seen at [facility] but there are no notes in chart. Looks like chart is awaiting merge and these notes are probably in a second chart. Wife reports pt drinks from a Solo cup, wears a bib, eats pudding thick foods which she describes as creamy. She confirms he doesnt want a feeding tube (pt confirms with gestures) even though he has been informed he cannot thrive without one. His current Chest imaging is clear. He has attempted MBS before, but "they couldnt do it because he couldnt swallow at all." When observed by SLP today pt refuses a straw, takes the cup and pours liquid into his open mouth with severe anterior spillage and  again during swallow due to pressure. Pt fed himself a bite of applesauce and with a posterior head tilt transited most of the creamy texture. Suspect pt will not be able to manage very thick purees, probably needs a fairly runny texture. Though pt is severely impaired, this is his baseline function and he is aware of risk. SLP will resume a full liquid diet and perhaps advance to purees if possible.      Aspiration Risk       Diet Recommendation Thin liquid;Dysphagia 1 (Puree);Pudding-thick liquid    Liquid Administration via: Cup Medication Administration: Crushed with puree Supervision: Patient able to self feed    Other  Recommendations Oral Care Recommendations: Oral care BID    Recommendations for follow up therapy are one component of a multi-disciplinary discharge planning process, led by the attending physician.  Recommendations may be updated based on patient status, additional functional criteria and insurance authorization.  Follow up Recommendations No SLP follow up      Assistance Recommended at Discharge    Functional Status Assessment    Frequency and Duration            Prognosis Prognosis for improved oropharyngeal function: Guarded Barriers to Reach Goals: Time post onset;Severity of deficits      Swallow Study   General HPI: Adam [name] is a 72 y.o. male with medical history significant of PAF on eliquis, HTN, HLD, CAD, T2DM, thoracic AA,  OSA, alcohol abuse with hx of wernicke encephalopathy and aphasia, pseudobulbar palsy, non verbal state who presented to ED after a fall at home with head injury. He was hypotensive per EMS to 80s on arrival and improved with 500cc bolus to SBP >100. He was drinking. Able to write his left leg gave out and he fell down and hit his head. Lost 100 pounds over last 1.5 years   MBSS attempted last month, but aborted as  he could not complete  Many discussions about G tube, but he has been adamant that he does not want this.   There is a much more elaborate history that is not available until charts merge. Type of Study: Bedside Swallow Evaluation Diet Prior to this Study: NPO Temperature Spikes Noted: No Respiratory Status: Room air History of Recent Intubation: No Behavior/Cognition: Alert;Cooperative;Pleasant mood Oral Cavity Assessment: Within Functional Limits Oral Care Completed by SLP: No Oral Cavity - Dentition: Poor condition;Missing dentition Vision: Functional for self-feeding Self-Feeding Abilities: Able to feed self Patient Positioning: Upright in bed Baseline Vocal Quality: Normal Volitional Cough:  (unable)    Oral/Motor/Sensory Function Overall Oral Motor/Sensory Function: Severe impairment (anathric - no lingual motion or labial seal. can open mandible)   Ice Chips     Thin Liquid Thin Liquid: Impaired Presentation: Cup Oral Phase Impairments: Reduced labial seal Oral Phase Functional Implications: Right anterior spillage;Left anterior spillage Pharyngeal  Phase Impairments: Multiple swallows;Other (comments) (phonates after swallows, doesnt cough)    Nectar Thick     Honey Thick     Puree Puree: Impaired Presentation: Spoon;Self Fed Oral Phase Impairments: Reduced lingual movement/coordination;Reduced labial seal Oral Phase Functional Implications: Prolonged oral transit Pharyngeal Phase Impairments: Multiple swallows   Solid           Harlon Ditty, MA CCC-SLP  Acute Rehabilitation Services Secure Chat Preferred Office [phone]  Claudine Mouton 09/30/2023,2:45 PM

## 2023-10-01 DIAGNOSIS — F1010 Alcohol abuse, uncomplicated: Secondary | ICD-10-CM

## 2023-10-01 DIAGNOSIS — D649 Anemia, unspecified: Secondary | ICD-10-CM | POA: Diagnosis not present

## 2023-10-01 LAB — TYPE AND SCREEN
ABO/RH(D): O POS
Antibody Screen: NEGATIVE
Unit division: 0

## 2023-10-01 LAB — GLUCOSE, CAPILLARY
Glucose-Capillary: 147 mg/dL — ABNORMAL HIGH (ref 70–99)
Glucose-Capillary: 104 mg/dL — ABNORMAL HIGH (ref 70–99)
Glucose-Capillary: 89 mg/dL (ref 70–99)
Glucose-Capillary: 120 mg/dL — ABNORMAL HIGH (ref 70–99)

## 2023-10-01 LAB — BASIC METABOLIC PANEL
Anion gap: 9 (ref 5–15)
BUN: 11 mg/dL (ref 8–23)
CO2: 24 mmol/L (ref 22–32)
Calcium: 8.7 mg/dL — ABNORMAL LOW (ref 8.9–10.3)
Chloride: 109 mmol/L (ref 98–111)
Creatinine, Ser: 0.89 mg/dL (ref 0.61–1.24)
GFR, Estimated: >60 mL/min (ref >60)
Glucose, Bld: 103 mg/dL — ABNORMAL HIGH (ref 70–99)
Potassium: 3.3 mmol/L — ABNORMAL LOW (ref 3.5–5.1)
Sodium: 142 mmol/L (ref 135–145)

## 2023-10-01 LAB — BPAM RBC
Blood Product Expiration Date: 202501232359
ISSUE DATE / TIME: 202412271153
Unit Type and Rh: 5100

## 2023-10-01 LAB — CBC
HCT: 24.1 % — ABNORMAL LOW (ref 39.0–52.0)
Hemoglobin: 8 g/dL — ABNORMAL LOW (ref 13.0–17.0)
MCH: 32 pg (ref 26.0–34.0)
MCHC: 33.2 g/dL (ref 30.0–36.0)
MCV: 96.4 fL (ref 80.0–100.0)
Platelets: 147 10*3/uL — ABNORMAL LOW (ref 150–400)
RBC: 2.5 MIL/uL — ABNORMAL LOW (ref 4.22–5.81)
RDW: 15.2 % (ref 11.5–15.5)
WBC: 5.6 10*3/uL (ref 4.0–10.5)
nRBC: 0 % (ref 0.0–0.2)

## 2023-10-01 LAB — MAGNESIUM: Magnesium: 1.8 mg/dL (ref 1.7–2.4)

## 2023-10-01 MED ORDER — POTASSIUM CHLORIDE CRYS ER 20 MEQ PO TBCR
40.0000 meq | EXTENDED_RELEASE_TABLET | Freq: Once | ORAL | Status: DC
  Filled 2023-10-01: qty 2

## 2023-10-01 MED ORDER — THIAMINE HCL 100 MG/ML IJ SOLN
500.0000 mg | Freq: Three times a day (TID) | INTRAVENOUS | Status: AC
  Administered 2023-10-01 – 2023-10-03 (×9): 500 mg via INTRAVENOUS
  Filled 2023-10-01 (×9): qty 5

## 2023-10-01 MED ORDER — AMIODARONE HCL 200 MG PO TABS
100.0000 mg | ORAL_TABLET | Freq: Every day | ORAL | Status: DC
  Administered 2023-10-02 – 2023-10-08 (×7): 100 mg via ORAL
  Filled 2023-10-01 (×7): qty 1

## 2023-10-01 MED ORDER — THIAMINE HCL 100 MG/ML IJ SOLN
500.0000 mg | Freq: Every day | INTRAVENOUS | Status: AC
  Administered 2023-10-04 – 2023-10-06 (×3): 500 mg via INTRAVENOUS
  Filled 2023-10-01 (×3): qty 5

## 2023-10-01 NOTE — Evaluation (Signed)
Occupational Therapy Evaluation Patient Details Name: Adam [name] MRN: [id number] DOB: [birth year] Today's Date: 10/01/2023   History of Present Illness 72 y.o. male presents to [facility] hospital on 09/30/2023 after a fall at home with head injury. Pt noted to be hypotensive, was consuming alcohol. PMH includes PAF on eliquis, HTN, HLD, CAD, DMII, thoracic AA, alcohol abuse, wernicke encephalopathy.   Clinical Impression   Patient admitted for the diagnosis above.  Limited assessment, patient initially moving, then appeared to become agitated, laid back on the bed and refused to move, eventually placing a pillow over his face.  OT can try back later to determine willingness to participate, otherwise can d/c.  No post acute OT expected.         If plan is discharge home, recommend the following: Assist for transportation    Functional Status Assessment  Patient has had a recent decline in their functional status and demonstrates the ability to make significant improvements in function in a reasonable and predictable amount of time.  Equipment Recommendations  None recommended by OT    Recommendations for Other Services       Precautions / Restrictions Precautions Precautions: Fall Restrictions Weight Bearing Restrictions Per Provider Order: No      Mobility Bed Mobility Overal bed mobility: Needs Assistance Bed Mobility: Supine to Sit, Sit to Supine     Supine to sit: Min assist, HOB elevated Sit to supine: Supervision, Contact guard assist        Transfers                   General transfer comment: refused      Balance Overall balance assessment: Needs assistance Sitting-balance support: No upper extremity supported, Feet supported Sitting balance-Leahy Scale: Good     Standing balance support: Bilateral upper extremity supported, Reliant on assistive device for balance Standing balance-Leahy Scale: Poor                             ADL  either performed or assessed with clinical judgement   ADL                                         General ADL Comments: Unable to determine based on agitation and eventual refusal to move/participate     Vision   Vision Assessment?: No apparent visual deficits     Perception Perception: Not tested       Praxis Praxis: Not tested       Pertinent Vitals/Pain Pain Assessment Pain Assessment: No/denies pain Pain Intervention(s): Monitored during session     Extremity/Trunk Assessment Upper Extremity Assessment Upper Extremity Assessment: Overall WFL for tasks assessed   Lower Extremity Assessment Lower Extremity Assessment: Defer to PT evaluation   Cervical / Trunk Assessment Cervical / Trunk Assessment: Normal   Communication Communication Communication: Difficulty communicating thoughts/reduced clarity of speech;Other (comment)   Cognition   Behavior During Therapy: Agitated Overall Cognitive Status: Difficult to assess                                 General Comments: Patient initially sitting EOB, then b\ecame agitated waving OT off, flipping OT the finger, laid down and put pillow over his face.     General Comments   A-fib  Exercises     Shoulder Instructions      Home Living Family/patient expects to be discharged to:: Private residence Living Arrangements: Spouse/significant other Available Help at Discharge: Family;Available PRN/intermittently Type of Home: House Home Access: Stairs to enter Entrance Stairs-Number of Steps: 1 Entrance Stairs-Rails: None Home Layout: One level         Firefighter: Standard     Home Equipment: Agricultural consultant (2 wheels);Rollator (4 wheels)          Prior Functioning/Environment Prior Level of Function : Independent/Modified Independent             Mobility Comments: ambulatory with rollator at baseline, pt indicated all of his falls occur when he is not utilizing  DME ADLs Comments: Unalb eot ascertain PLOF from patient        OT Problem List: Impaired balance (sitting and/or standing);Decreased safety awareness      OT Treatment/Interventions: Self-care/ADL training;Therapeutic activities;Balance training    OT Goals(Current goals can be found in the care plan section) Acute Rehab OT Goals OT Goal Formulation: Patient unable to participate in goal setting Time For Goal Achievement: 10/17/23 Potential to Achieve Goals: Fair  OT Frequency:      Co-evaluation              AM-PAC OT "6 Clicks" Daily Activity     Outcome Measure Help from another person eating meals?: None Help from another person taking care of personal grooming?: None Help from another person toileting, which includes using toliet, bedpan, or urinal?: A Little Help from another person bathing (including washing, rinsing, drying)?: A Little Help from another person to put on and taking off regular upper body clothing?: None Help from another person to put on and taking off regular lower body clothing?: A Little 6 Click Score: 21   End of Session Equipment Utilized During Treatment: Rolling walker (2 wheels) Nurse Communication: Mobility status  Activity Tolerance: Treatment limited secondary to agitation Patient left: in bed;with call bell/phone within reach;with family/visitor present  OT Visit Diagnosis: Unsteadiness on feet (R26.81)                Time: 2130-8657 OT Time Calculation (min): 14 min Charges:  OT General Charges $OT Visit: 1 Visit OT Evaluation $OT Eval Moderate Complexity: 1 Mod  10/01/2023  RP, OTR/L  Acute Rehabilitation Services  Office:  [phone]   Suzanna Obey 10/01/2023, 2:57 PM

## 2023-10-01 NOTE — Progress Notes (Signed)
PROGRESS NOTE    Adam [name]  UXL:[id number] DOB: 1950/12/08 DOA: 09/30/2023 PCP: Kristian Covey, MD   No chief complaint on file.   Brief Narrative:    Adam [name] is a 72 y.o. male with medical history significant of PAF on eliquis, HTN, HLD, CAD, T2DM, thoracic AA, OSA, alcohol abuse with hx of wernicke encephalopathy and aphasia, pseudobulbar palsy, non verbal state who presented to ED after a fall at home with head injury. He was hypotensive per EMS to 80s on arrival and improved with 500cc bolus to SBP >100. He was drinking. Able to write his left leg gave out and he fell down and hit his head.  He denies smoking, drinks all day long. Scotch or vodka. Starts in the morning.  - In ED: started on CIWA protocol, right cheek laceration repaired with 1 suture (nylon)     Assessment & Plan:   Principal Problem:   Acute on chronic anemia Active Problems:   Fall at home, initial encounter   Alcohol abuse   Hypokalemia   Dysphagia   PAF (paroxysmal atrial fibrillation) (HCC)   progressive neurologica disorder with pseudobulbar palsy, aphasia, wernicke encephalopathy with continued alcohol use   Essential hypertension   CAD (coronary artery disease)   Controlled type 2 diabetes mellitus without complication, without long-term current use of insulin (HCC)   COPD with asthma (HCC)   Thoracic aortic aneurysm (HCC)   Hyperlipidemia   Depression   OSA (obstructive sleep apnea)   Wernicke encephalopathy   Acute blood loss anemia -Hemoglobin around 11, down to 8, secondary to blood loss anemia secondary to acute blood loss secondary to hematoma in right thigh area from fall while intoxicated. -CBC closely and transfuse as needed, he received 1 unit PRBC 12/27.  -Continue to hold Eliquis   Fall at home Unwitnessed fall in setting of alcohol abuse/wernicke encephalopathy  Head CT with no acute findings  This is most likely due to alcohol education, he is high risk for  fall, -He has an nylon suture under the right thigh, will need to be removed in 7 days  Hypokalemia Replaced   Alcohol abuse Very high risk of withdrawal  In progressive Drinks all day scotch and liqour, wife states he has had withdrawal before Continue CIWA protocol    Dysphagia -Seems to be chronic problem, multifactorial mainly the setting of Wernicke's encephalopathy -He is high risk for aspiration -Seen by SLP -Patient is high risk for aspiration, patient aware of this has refused PEG tube multiple times in the past, as well at this point he is not a candidate for it, and I have discussed with wife, she is aware of this, and they do accept these risks of aspiration, so we will continue him on diet.   PAF (paroxysmal atrial fibrillation) (HCC) NSR s/p ablation ILR in situ  He is on Eliquis, he is high risk for fall, and devastating bleed, he is with multiple falls, per wife "I cannot even count how many falls" , so we will continue to hold, and not to resume on discharge Continue amiodarone Hold beta blocker with hypotension    progressive neurologica disorder with pseudobulbar palsy, aphasia, wernicke encephalopathy with continued alcohol use -followed by neurology, recently prescribed  Nuedexta in November of this year  -continues to drink, worsening dysphagia: palliative care  -consider MRI brain, but has ILR in place -discussed GOC with wife. She can no longer provide care for him at home. SW consult placed -  He has DNR order at home, but is telling me is a full code here. Again, GOC need to be discussed further, palliative consult pending    Essential hypertension Blood pressure have been soft Continue IVF Check orthostatic  Hold oral meds for now    CAD (coronary artery disease) multiple stent placements CABG x 6 in [place] in 2011 (LIMA to LAD, RIMA to diagonal, left radial artery to circumflex, SVG to PDA, SVG to PL SA, and questionable SVG  No chest  pain Continue medical management.  EKG/troponin pending    Controlled type 2 diabetes mellitus without complication, without long-term current use of insulin (HCC) A1C of 5.9 in 08/2023 Sensitive SSI and accuchecks QAC/HS  Hold synjardy XR    COPD with asthma (HCC) No signs/symptoms of flair  Continue home medications    Thoracic aortic aneurysm (HCC) 4.3 on CT today, followed annually by cardiology  Stable    Hyperlipidemia Continue crestor    Depression Continue cymbalta   Goals of care discussion -Palliative care consulted, as as discussed with the wife, patient is intoxicated and drinking all day long, poor life quality.   DVT prophylaxis: SCD's Code Status: Full Family Communication: D/W wife by phone Disposition:   Status is: Inpatient    Consultants:     Subjective:    Objective: Vitals:   10/01/23 0500 10/01/23 0656 10/01/23 0800 10/01/23 1215  BP:   129/67 90/66  Pulse:   78 79  Resp:   15 15  Temp: (!) 97.5 F (36.4 C) 98.3 F (36.8 C) (!) 97.4 F (36.3 C) 98.3 F (36.8 C)  TempSrc: Oral Oral Oral   SpO2:   99% 99%  Weight:      Height:       No intake or output data in the 24 hours ending 10/01/23 1321 Filed Weights   09/30/23 0134  Weight: 68 kg    Examination:  Awake, nonverbal, able to write, frail, ill-appearing Symmetrical Chest wall movement, Good air movement bilaterally, CTAB RRR,No Gallops,Rubs or new Murmurs, No Parasternal Heave +ve B.Sounds, Abd Soft, No tenderness, No rebound - guarding or rigidity. No Cyanosis, Clubbing or edema, significant right hip bruise        Data Reviewed: I have personally reviewed following labs and imaging studies  CBC: Recent Labs  Lab 09/30/23 0124 09/30/23 0134 09/30/23 0657 09/30/23 0952 09/30/23 1728 10/01/23 0324  WBC 6.8  --   --  5.8 5.5 5.6  HGB 8.0* 7.1* 7.4* 7.5* 7.9* 8.0*  HCT 24.4* 21.0* 22.6* 22.7* 23.8* 24.1*  MCV 99.2  --   --  98.7 97.1 96.4  PLT 169  --    --  143* 142* 147*    Basic Metabolic Panel: Recent Labs  Lab 09/30/23 0124 09/30/23 0134 09/30/23 0952 10/01/23 0324  NA 140 139  --  142  K 3.2* 3.1*  --  3.3*  CL 105 106  --  109  CO2 21*  --   --  24  GLUCOSE 113* 108*  --  103*  BUN 18 17  --  11  CREATININE 1.05 1.00  --  0.89  CALCIUM 8.9  --   --  8.7*  MG  --   --  2.0  --     GFR: Estimated Creatinine Clearance: 71.1 mL/min (by C-G formula based on SCr of 0.89 mg/dL).  Liver Function Tests: Recent Labs  Lab 09/30/23 0124  AST 49*  ALT 30  ALKPHOS 40  BILITOT 1.0  PROT 5.8*  ALBUMIN 3.1*    CBG: Recent Labs  Lab 09/30/23 1114 09/30/23 1725 09/30/23 2243 10/01/23 0806 10/01/23 1215  GLUCAP 104* 92 114* 147* 104*     No results found for this or any previous visit (from the past 240 hours).       Radiology Studies: DG Femur Min 2 Views Left Result Date: 09/30/2023 CLINICAL DATA:  Level 1 trauma patient, fall injury. EXAM: LEFT FEMUR 2 VIEWS; LEFT KNEE - COMPLETE 4+ VIEW COMPARISON:  Left femoral and left knee series from 3 days ago 09/27/2023 FINDINGS: AP and lateral left femur: Left hip arthroplasty with cemented femoral component without visible evidence of loosening. There is osteopenia without evidence for fractures or other focal bone lesions. No focal soft tissue swelling is seen. There is artifact from overlying clothing. There is contrast in the bladder and the distal left ureter. There is heavy vascular calcification. Left knee, four views: Small suprapatellar bursal effusion is unchanged. There is osteopenia without evidence of fracture or dislocation. Tricompartmental joint narrowing and osteophytosis is seen, greatest narrowing in the medial femorotibial joint Superficial soft tissues are unremarkable. The popliteal artery and trifurcation arteries are heavily calcified. IMPRESSION: 1. No evidence of fractures or other new findings in the left femur or left knee. Small suprapatellar bursal  effusion is unchanged. 2. Osteopenia and degenerative change. 3. Heavy vascular calcification. Electronically Signed   By: Almira Bar M.D.   On: 09/30/2023 02:45   DG Knee Complete 4 Views Left Result Date: 09/30/2023 CLINICAL DATA:  Level 1 trauma patient, fall injury. EXAM: LEFT FEMUR 2 VIEWS; LEFT KNEE - COMPLETE 4+ VIEW COMPARISON:  Left femoral and left knee series from 3 days ago 09/27/2023 FINDINGS: AP and lateral left femur: Left hip arthroplasty with cemented femoral component without visible evidence of loosening. There is osteopenia without evidence for fractures or other focal bone lesions. No focal soft tissue swelling is seen. There is artifact from overlying clothing. There is contrast in the bladder and the distal left ureter. There is heavy vascular calcification. Left knee, four views: Small suprapatellar bursal effusion is unchanged. There is osteopenia without evidence of fracture or dislocation. Tricompartmental joint narrowing and osteophytosis is seen, greatest narrowing in the medial femorotibial joint Superficial soft tissues are unremarkable. The popliteal artery and trifurcation arteries are heavily calcified. IMPRESSION: 1. No evidence of fractures or other new findings in the left femur or left knee. Small suprapatellar bursal effusion is unchanged. 2. Osteopenia and degenerative change. 3. Heavy vascular calcification. Electronically Signed   By: Almira Bar M.D.   On: 09/30/2023 02:45   CT CHEST ABDOMEN PELVIS W CONTRAST Result Date: 09/30/2023 CLINICAL DATA:  Polytrauma, blunt EXAM: CT CHEST, ABDOMEN, AND PELVIS WITH CONTRAST TECHNIQUE: Multidetector CT imaging of the chest, abdomen and pelvis was performed following the standard protocol during bolus administration of intravenous contrast. RADIATION DOSE REDUCTION: This exam was performed according to the departmental dose-optimization program which includes automated exposure control, adjustment of the mA and/or kV  according to patient size and/or use of iterative reconstruction technique. CONTRAST:  75mL OMNIPAQUE IOHEXOL 350 MG/ML SOLN COMPARISON:  CT angio chest 06/17/2022, ultrasound renal 02/12/2023, x-ray pelvis 09/30/2023 FINDINGS: CHEST: Cardiovascular: No aortic injury. 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 Four-vessel coronary calcification. Status post coronary bypass graft and stents. The heart is normal in size. No significant pericardial effusion. Mediastinum/Nodes: No pneumomediastinum. No mediastinal hematoma. The esophagus is unremarkable. The thyroid is  unremarkable. The central airways are patent. No mediastinal, hilar, or axillary lymphadenopathy. Lungs/Pleura: No focal consolidation. No pulmonary nodule. No pulmonary mass. No pulmonary contusion or laceration. No pneumatocele formation. Chronic right rib fractures. No pleural effusion. No pneumothorax. No hemothorax. Musculoskeletal/Chest wall: No chest wall mass. No acute rib or sternal fracture. No spinal fracture. Multilevel degenerative changes of the spine. Multiple chronically fractured sternotomy wires. ABDOMEN / PELVIS: Hepatobiliary: Not enlarged. No focal lesion. No laceration or subcapsular hematoma. Calcified gallstones within the gallbladder lumen. No gallbladder wall thickening or pericholecystic fluid. No biliary ductal dilatation. Pancreas: Normal pancreatic contour. No main pancreatic duct dilatation. Spleen: Not enlarged. No focal lesion. No laceration, subcapsular hematoma, or vascular injury. Adrenals/Urinary Tract: No nodularity bilaterally. Bilateral kidneys enhance symmetrically. No hydronephrosis. No contusion, laceration, or subcapsular hematoma. No injury to the vascular structures or collecting systems. No hydroureter. The urinary bladder is unremarkable. Stomach/Bowel: No small or large bowel wall thickening or dilatation. The appendix is not definitely identified with no inflammatory changes in the  right lower quadrant to suggest acute appendicitis. Vasculature/Lymphatics: No abdominal aorta or iliac aneurysm. No active contrast extravasation or pseudoaneurysm. No abdominal, pelvic, inguinal lymphadenopathy. Reproductive: Normal. Other: No simple free fluid ascites. No pneumoperitoneum. No hemoperitoneum. No mesenteric hematoma identified. No organized fluid collection. Musculoskeletal: Right hip subcutaneus soft tissue hematoma formation. No acute pelvic fracture. No spinal fracture. Multilevel severe degenerative changes of the spine. 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 Partially visualized total bilateral hip arthroplasty. Ports and Devices: None. IMPRESSION: 1. Right hip subcutaneus soft tissue hematoma formation. 2. No acute intrathoracic, intra-abdominal, intrapelvic traumatic injury. 3. No acute fracture or traumatic malalignment of the thoracic or lumbar spine. Other imaging findings of potential clinical significance: 1. Cholelithiasis with no CT evidence of acute cholecystitis. 2. Stable ascending thoracic aorta (4.3 cm). Recommend annual imaging followup by CTA or MRA. This recommendation follows 2010 ACCF/AHA/AATS/ACR/ASA/SCA/SCAI/SIR/STS/SVM Guidelines for the Diagnosis and Management of Patients with Thoracic Aortic Disease. Circulation. 2010; 121: U132-G401. Aortic aneurysm NOS (ICD10-I71.9). These results were called by telephone at the time of interpretation on 09/30/2023 at 1:45 am to provider Dr. Cliffton Asters, Who verbally acknowledged these results. Electronically Signed   By: Tish Frederickson M.D.   On: 09/30/2023 02:00   CT HEAD WO CONTRAST Result Date: 09/30/2023 CLINICAL DATA:  Head trauma, moderate-severe; Polytrauma, blunt EXAM: CT HEAD WITHOUT CONTRAST CT CERVICAL SPINE WITHOUT CONTRAST TECHNIQUE: Multidetector CT imaging of the head and cervical spine was performed following the standard protocol without intravenous contrast.  Multiplanar CT image reconstructions of the cervical spine were also generated. RADIATION DOSE REDUCTION: This exam was performed according to the departmental dose-optimization program which includes automated exposure control, adjustment of the mA and/or kV according to patient size and/or use of iterative reconstruction technique. COMPARISON:  CT head and C-spine 09/27/2023 FINDINGS: CT HEAD FINDINGS Brain: Stable prominence of the lateral ventricles may be related to central predominant atrophy, although a component of normal pressure/communicating hydrocephalus cannot be excluded. Patchy and confluent areas of decreased attenuation are noted throughout the deep and periventricular white matter of the cerebral hemispheres bilaterally, compatible with chronic microvascular ischemic disease. No evidence of large-territorial acute infarction. No parenchymal hemorrhage. No mass lesion. No extra-axial collection. No mass effect or midline shift. No hydrocephalus. Basilar cisterns are patent. Vascular: No hyperdense vessel. Skull: No acute fracture or focal lesion. Sinuses/Orbits: Left maxillary sinus mucosal thickening. Paranasal sinuses and mastoid air cells are clear. The orbits  are unremarkable. Other: None. CT CERVICAL SPINE FINDINGS Alignment: Normal. Skull base and vertebrae: Multilevel degenerative changes spine most prominent at the C5 through C7 levels. No associated severe osseous neural foraminal or central canal stenosis. No acute fracture. No aggressive appearing focal osseous lesion or focal pathologic process. Soft tissues and spinal canal: No prevertebral fluid or swelling. No visible canal hematoma. Upper chest: Unremarkable. Other: Atherosclerotic plaque of the carotid arteries within the neck. IMPRESSION: 1. No acute intracranial abnormality. 2. No acute displaced fracture or traumatic listhesis of the cervical spine. 3. Stable prominence of the lateral ventricles may be related to central  predominant atrophy, although a component of normal pressure/communicating hydrocephalus cannot be excluded. These results were called by telephone at the time of interpretation on 09/30/2023 at 1:45 am to provider Dr. Cliffton Asters, Who verbally acknowledged these results. Electronically Signed   By: Tish Frederickson M.D.   On: 09/30/2023 01:53   CT CERVICAL SPINE WO CONTRAST Result Date: 09/30/2023 CLINICAL DATA:  Head trauma, moderate-severe; Polytrauma, blunt EXAM: CT HEAD WITHOUT CONTRAST CT CERVICAL SPINE WITHOUT CONTRAST TECHNIQUE: Multidetector CT imaging of the head and cervical spine was performed following the standard protocol without intravenous contrast. Multiplanar CT image reconstructions of the cervical spine were also generated. RADIATION DOSE REDUCTION: This exam was performed according to the departmental dose-optimization program which includes automated exposure control, adjustment of the mA and/or kV according to patient size and/or use of iterative reconstruction technique. COMPARISON:  CT head and C-spine 09/27/2023 FINDINGS: CT HEAD FINDINGS Brain: Stable prominence of the lateral ventricles may be related to central predominant atrophy, although a component of normal pressure/communicating hydrocephalus cannot be excluded. Patchy and confluent areas of decreased attenuation are noted throughout the deep and periventricular white matter of the cerebral hemispheres bilaterally, compatible with chronic microvascular ischemic disease. No evidence of large-territorial acute infarction. No parenchymal hemorrhage. No mass lesion. No extra-axial collection. No mass effect or midline shift. No hydrocephalus. Basilar cisterns are patent. Vascular: No hyperdense vessel. Skull: No acute fracture or focal lesion. Sinuses/Orbits: Left maxillary sinus mucosal thickening. Paranasal sinuses and mastoid air cells are clear. The orbits are unremarkable. Other: None. CT CERVICAL SPINE FINDINGS Alignment: Normal.  Skull base and vertebrae: Multilevel degenerative changes spine most prominent at the C5 through C7 levels. No associated severe osseous neural foraminal or central canal stenosis. No acute fracture. No aggressive appearing focal osseous lesion or focal pathologic process. Soft tissues and spinal canal: No prevertebral fluid or swelling. No visible canal hematoma. Upper chest: Unremarkable. Other: Atherosclerotic plaque of the carotid arteries within the neck. IMPRESSION: 1. No acute intracranial abnormality. 2. No acute displaced fracture or traumatic listhesis of the cervical spine. 3. Stable prominence of the lateral ventricles may be related to central predominant atrophy, although a component of normal pressure/communicating hydrocephalus cannot be excluded. These results were called by telephone at the time of interpretation on 09/30/2023 at 1:45 am to provider Dr. Cliffton Asters, Who verbally acknowledged these results. Electronically Signed   By: Tish Frederickson M.D.   On: 09/30/2023 01:53   DG Pelvis Portable Result Date: 09/30/2023 CLINICAL DATA:  Trauma EXAM: PORTABLE PELVIS 1-2 VIEWS COMPARISON:  CT abdomen pelvis 09/30/2023, x-ray left femur 09/27/2023 FINDINGS: Bilateral total hip arthroplasty partially visualized. No radiographic findings suggest surgical hardware complication. There is no evidence of pelvic fracture or diastasis. No pelvic bone lesions are seen. Vascular calcification. IMPRESSION: Negative for acute traumatic injury. Electronically Signed   By: Tish Frederickson M.D.   On:  09/30/2023 01:49   DG Chest Port 1 View Result Date: 09/30/2023 CLINICAL DATA:  Trauma EXAM: PORTABLE CHEST 1 VIEW COMPARISON:  CT chest 09/30/2023. chest x-ray 09/27/2023 FINDINGS: Wireless cardiac device overlies the left chest. Coronary artery stents. The heart and mediastinal contours are unchanged. No focal consolidation. No pulmonary edema. No pleural effusion. No pneumothorax. No acute osseous abnormality.   Chronic fractured sternotomy wires. IMPRESSION: No active disease. These results were called by telephone at the time of interpretation on 09/30/2023 at 1:45 am to provider Dr. Cliffton Asters, Who verbally acknowledged these results. Electronically Signed   By: Tish Frederickson M.D.   On: 09/30/2023 01:49        Scheduled Meds:  sodium chloride   Intravenous Once   amiodarone  200 mg Oral Daily   folic acid  1 mg Oral Daily   insulin aspart  0-9 Units Subcutaneous TID WC   LORazepam  0-4 mg Intravenous Q6H   Followed by   Melene Muller ON 10/02/2023] LORazepam  0-4 mg Intravenous Q12H   multivitamin with minerals  1 tablet Oral Daily   pantoprazole (PROTONIX) IV  40 mg Intravenous Q24H   potassium chloride  40 mEq Oral Once   Continuous Infusions:  thiamine (VITAMIN B1) injection 500 mg (10/01/23 1233)   Followed by   Melene Muller ON 10/04/2023] thiamine (VITAMIN B1) injection       LOS: 1 day       Huey Bienenstock, MD [facility]   To contact the attending provider between 7A-7P or the covering provider during after hours 7P-7A, please log into the web site [email or web address] and access using universal [place] password for that web site. If you do not have the password, please call the hospital operator.  10/01/2023, 1:21 PM

## 2023-10-01 NOTE — Evaluation (Signed)
Physical Therapy Evaluation Patient Details Name: Adam [name] MRN: [id number] DOB: 1951-09-24 Today's Date: 10/01/2023  History of Present Illness  72 y.o. male presents to [facility] hospital on 09/30/2023 after a fall at home with head injury. Pt noted to be hypotensive, was consuming alcohol. PMH includes PAF on eliquis, HTN, HLD, CAD, DMII, thoracic AA, alcohol abuse, wernicke encephalopathy.  Clinical Impression  Pt presents to PT with deficits in functional mobility, gait, balance, power, strength, endurance. Pt appears limited by generalized pain and soreness in BLE, moaning intermittently when mobilizing. Pt requires physical assist to perform bed mobility and to power up during transfers. Once standing the pt ambulates with fair stability. PT encourages the pt to mobilize frequently with staff assistance. PT anticipates the pt will progress well, recommending HHPT and use of walker for all out of bed mobility to reduce falls risk.        If plan is discharge home, recommend the following: A little help with walking and/or transfers;A little help with bathing/dressing/bathroom;Assistance with cooking/housework;Assist for transportation;Help with stairs or ramp for entrance   Can travel by private vehicle        Equipment Recommendations BSC/3in1  Recommendations for Other Services       Functional Status Assessment Patient has had a recent decline in their functional status and demonstrates the ability to make significant improvements in function in a reasonable and predictable amount of time.     Precautions / Restrictions Precautions Precautions: Fall Restrictions Weight Bearing Restrictions Per Provider Order: No      Mobility  Bed Mobility Overal bed mobility: Needs Assistance Bed Mobility: Supine to Sit, Sit to Supine     Supine to sit: Min assist, HOB elevated Sit to supine: Min assist        Transfers Overall transfer level: Needs assistance Equipment used:  Rolling walker (2 wheels) Transfers: Sit to/from Stand Sit to Stand: Min assist                Ambulation/Gait Ambulation/Gait assistance: Contact guard assist Gait Distance (Feet): 20 Feet Assistive device: Rolling walker (2 wheels) Gait Pattern/deviations: Step-to pattern Gait velocity: reduced Gait velocity interpretation: <1.8 ft/sec, indicate of risk for recurrent falls   General Gait Details: pt with slowed step-to gait, reduced step-length  Stairs            Wheelchair Mobility     Tilt Bed    Modified Rankin (Stroke Patients Only)       Balance Overall balance assessment: Needs assistance Sitting-balance support: No upper extremity supported, Feet supported Sitting balance-Leahy Scale: Good     Standing balance support: Bilateral upper extremity supported, Reliant on assistive device for balance Standing balance-Leahy Scale: Poor                               Pertinent Vitals/Pain Pain Assessment Pain Assessment: 0-10 Pain Score: 7  Pain Location: L knee, R hip Pain Descriptors / Indicators: Grimacing Pain Intervention(s): Monitored during session    Home Living Family/patient expects to be discharged to:: Private residence Living Arrangements: Spouse/significant other Available Help at Discharge: Family;Available PRN/intermittently Type of Home: House Home Access: Stairs to enter Entrance Stairs-Rails: None Entrance Stairs-Number of Steps: 1   Home Layout: One level Home Equipment: Agricultural consultant (2 wheels);Rollator (4 wheels)      Prior Function Prior Level of Function : Independent/Modified Independent  Mobility Comments: ambulatory with rollator at baseline, pt indicated all of his falls occur when he is not utilizing DME       Extremity/Trunk Assessment   Upper Extremity Assessment Upper Extremity Assessment: Generalized weakness    Lower Extremity Assessment Lower Extremity Assessment:  Generalized weakness    Cervical / Trunk Assessment Cervical / Trunk Assessment: Normal  Communication   Communication Communication: Difficulty communicating thoughts/reduced clarity of speech;Other (comment) (pt is non-verbal, communicates with gestures and writing) Cueing Techniques: Verbal cues  Cognition Arousal: Alert Behavior During Therapy: WFL for tasks assessed/performed Overall Cognitive Status: Within Functional Limits for tasks assessed                                          General Comments General comments (skin integrity, edema, etc.): VSS on RA    Exercises     Assessment/Plan    PT Assessment Patient needs continued PT services  PT Problem List Decreased strength;Decreased balance;Decreased activity tolerance;Decreased mobility;Pain       PT Treatment Interventions Gait training;DME instruction;Stair training;Functional mobility training;Therapeutic activities;Balance training;Therapeutic exercise;Neuromuscular re-education;Patient/family education    PT Goals (Current goals can be found in the Care Plan section)  Acute Rehab PT Goals Patient Stated Goal: to improve activity tolerance, restore independence in mobility PT Goal Formulation: With patient Time For Goal Achievement: 10/15/23 Potential to Achieve Goals: Fair    Frequency Min 1X/week     Co-evaluation               AM-PAC PT "6 Clicks" Mobility  Outcome Measure Help needed turning from your back to your side while in a flat bed without using bedrails?: A Little Help needed moving from lying on your back to sitting on the side of a flat bed without using bedrails?: A Little Help needed moving to and from a bed to a chair (including a wheelchair)?: A Little Help needed standing up from a chair using your arms (e.g., wheelchair or bedside chair)?: A Little Help needed to walk in hospital room?: A Little Help needed climbing 3-5 steps with a railing? : A Lot 6 Click  Score: 17    End of Session Equipment Utilized During Treatment: Gait belt Activity Tolerance: Patient tolerated treatment well Patient left: in bed;with call bell/phone within reach;with bed alarm set Nurse Communication: Mobility status PT Visit Diagnosis: Other abnormalities of gait and mobility (R26.89);Muscle weakness (generalized) (M62.81)    Time: 2130-8657 PT Time Calculation (min) (ACUTE ONLY): 18 min   Charges:   PT Evaluation $PT Eval Low Complexity: 1 Low   PT General Charges $$ ACUTE PT VISIT: 1 Visit         Arlyss Gandy, PT, DPT Acute Rehabilitation Office [phone]   Arlyss Gandy 10/01/2023, 12:21 PM

## 2023-10-01 NOTE — Progress Notes (Signed)
Speech Language Pathology Treatment: Dysphagia  Patient Details Name: Adam [name] MRN: [id number] DOB: 08-20-1951 Today's Date: 10/01/2023 Time: 2376-2831 SLP Time Calculation (min) (ACUTE ONLY): 14 min  Assessment / Plan / Recommendation Clinical Impression  SLP called to visit pt to clarify methods for swallowing pills. RN attempted pills crushed in puree, but pt orally held them and then spit them out. When asked how he takes pills pt (who is anarthric, totally non verbal but cognitively intact and communicates via writing and gestures) demonstrates laying himself flat, gestures a pinch to indicate a whole pill (confirmed with a thumbs up after SLP asked 'a whole pill? Not crushed?) and demonstrates placing the imaginary pill in the back of his mouth. SLP verbally described this and pt confirmed with a thumbs up. SLP asked "then do you take applesauce with it?" Pt confirms yes with a thumbs up and a nod. SLP again describes the process "So, because you can't move your tongue, you have to use gravity. You lay down, place the pill in the back of your mouth and take a spoon of puree to swallow with it." Pt nodded enthusiastically and gave thumbs up.   SLP described to pts RN who reasonably expressed concern for aspiration. SLP clarified that pt eats and drinks with known risk of aspiration, that he does not want any feeding tubes and that he has figured out ways to swallow that work for him and we are allowing him to decide what and how he wants to eat and drink because he is able to do so. His wife confirmed this with me yesterday on speaker phone with the pt. If further concerns arise, I would advise calling the pts wife and asking again for confirmation of known risk and description of his methods.   SLP will continue to follow only to broker these methods. There is no intervention planned.    HPI HPI: Adam [name] is a 72 y.o. male with medical history significant of PAF on eliquis, HTN,  HLD, CAD, T2DM, thoracic AA, OSA, alcohol abuse with hx of wernicke encephalopathy and aphasia, pseudobulbar palsy, non verbal state who presented to ED after a fall at home with head injury. He was hypotensive per EMS to 80s on arrival and improved with 500cc bolus to SBP >100. He was drinking. Able to write his left leg gave out and he fell down and hit his head. Lost 100 pounds over last 1.5 years   MBSS attempted last month, but aborted as  he could not complete  Many discussions about G tube, but he has been adamant that he does not want this.  There is a much more elaborate history that is not available until charts merge.      SLP Plan  Continue with current plan of care      Recommendations for follow up therapy are one component of a multi-disciplinary discharge planning process, led by the attending physician.  Recommendations may be updated based on patient status, additional functional criteria and insurance authorization.    Recommendations  Diet recommendations: Thin liquid Liquids provided via: Cup Medication Administration: Whole meds with puree Supervision: Patient able to self feed                  Oral care BID           Continue with current plan of care     Adam [name], Adam [name]  10/01/2023, 1:43 PM

## 2023-10-01 NOTE — Plan of Care (Signed)
  Problem: Education: Goal: Ability to describe self-care measures that may prevent or decrease complications (Diabetes Survival Skills Education) will improve Outcome: Progressing   Problem: Coping: Goal: Ability to adjust to condition or change in health will improve Outcome: Progressing   Problem: Health Behavior/Discharge Planning: Goal: Ability to identify and utilize available resources and services will improve Outcome: Progressing   Problem: Metabolic: Goal: Ability to maintain appropriate glucose levels will improve Outcome: Progressing   Problem: Nutritional: Goal: Maintenance of adequate nutrition will improve Outcome: Progressing   Problem: Skin Integrity: Goal: Risk for impaired skin integrity will decrease Outcome: Progressing   Problem: Clinical Measurements: Goal: Will remain free from infection Outcome: Progressing Goal: Diagnostic test results will improve Outcome: Progressing   Problem: Activity: Goal: Risk for activity intolerance will decrease Outcome: Progressing   Problem: Coping: Goal: Level of anxiety will decrease Outcome: Progressing

## 2023-10-01 NOTE — Plan of Care (Signed)

## 2023-10-02 DIAGNOSIS — D649 Anemia, unspecified: Secondary | ICD-10-CM | POA: Diagnosis not present

## 2023-10-02 LAB — GLUCOSE, CAPILLARY
Glucose-Capillary: 96 mg/dL (ref 70–99)
Glucose-Capillary: 100 mg/dL — ABNORMAL HIGH (ref 70–99)
Glucose-Capillary: 109 mg/dL — ABNORMAL HIGH (ref 70–99)
Glucose-Capillary: 145 mg/dL — ABNORMAL HIGH (ref 70–99)

## 2023-10-02 LAB — CBC
HCT: 24.8 % — ABNORMAL LOW (ref 39.0–52.0)
Hemoglobin: 8.3 g/dL — ABNORMAL LOW (ref 13.0–17.0)
MCH: 32 pg (ref 26.0–34.0)
MCHC: 33.5 g/dL (ref 30.0–36.0)
MCV: 95.8 fL (ref 80.0–100.0)
Platelets: 173 10*3/uL (ref 150–400)
RBC: 2.59 MIL/uL — ABNORMAL LOW (ref 4.22–5.81)
RDW: 15.1 % (ref 11.5–15.5)
WBC: 6 10*3/uL (ref 4.0–10.5)
nRBC: 0 % (ref 0.0–0.2)

## 2023-10-02 LAB — BASIC METABOLIC PANEL
Anion gap: 10 (ref 5–15)
BUN: 9 mg/dL (ref 8–23)
CO2: 23 mmol/L (ref 22–32)
Calcium: 8.6 mg/dL — ABNORMAL LOW (ref 8.9–10.3)
Chloride: 108 mmol/L (ref 98–111)
Creatinine, Ser: 0.85 mg/dL (ref 0.61–1.24)
GFR, Estimated: >60 mL/min (ref >60)
Glucose, Bld: 95 mg/dL (ref 70–99)
Potassium: 3.2 mmol/L — ABNORMAL LOW (ref 3.5–5.1)
Sodium: 141 mmol/L (ref 135–145)

## 2023-10-02 LAB — PHOSPHORUS: Phosphorus: 2.6 mg/dL (ref 2.5–4.6)

## 2023-10-02 MED ORDER — POTASSIUM CHLORIDE 10 MEQ/100ML IV SOLN
10.0000 meq | INTRAVENOUS | Status: AC
  Administered 2023-10-02 (×4): 10 meq via INTRAVENOUS
  Filled 2023-10-02 (×4): qty 100

## 2023-10-02 MED ORDER — POTASSIUM CHLORIDE CRYS ER 20 MEQ PO TBCR: 40.0000 meq | EXTENDED_RELEASE_TABLET | Freq: Four times a day (QID) | ORAL | Status: DC

## 2023-10-02 MED ORDER — K PHOS MONO-SOD PHOS DI & MONO 155-852-130 MG PO TABS
500.0000 mg | ORAL_TABLET | Freq: Two times a day (BID) | ORAL | Status: AC
  Administered 2023-10-02 (×2): 500 mg via ORAL
  Filled 2023-10-02 (×2): qty 2

## 2023-10-02 NOTE — Progress Notes (Signed)
PROGRESS NOTE    JACKEY AXLINE  ZSW:[id number] DOB: December 14, 1950 DOA: 09/30/2023 PCP: Kristian Covey, MD   No chief complaint on file.   Brief Narrative:    Adam [name] is a 72 y.o. male with medical history significant of PAF on eliquis, HTN, HLD, CAD, T2DM, thoracic AA, OSA, alcohol abuse with hx of wernicke encephalopathy and aphasia, pseudobulbar palsy, non verbal state who presented to ED after a fall at home with head injury. He was hypotensive per EMS to 80s on arrival and improved with 500cc bolus to SBP >100. He was drinking. Able to write his left leg gave out and he fell down and hit his head.  He denies smoking, drinks all day long. Scotch or vodka. Starts in the morning.  - In ED: started on CIWA protocol, right cheek laceration repaired with 1 suture (nylon)     Assessment & Plan:   Principal Problem:   Acute on chronic anemia Active Problems:   Fall at home, initial encounter   Alcohol abuse   Hypokalemia   Dysphagia   PAF (paroxysmal atrial fibrillation) (HCC)   progressive neurologica disorder with pseudobulbar palsy, aphasia, wernicke encephalopathy with continued alcohol use   Essential hypertension   CAD (coronary artery disease)   Controlled type 2 diabetes mellitus without complication, without long-term current use of insulin (HCC)   COPD with asthma (HCC)   Thoracic aortic aneurysm (HCC)   Hyperlipidemia   Depression   OSA (obstructive sleep apnea)   Wernicke encephalopathy   Acute blood loss anemia -Baseline hemoglobin at 11, down to 8 on admission, and continued to trend down, so he received 1 unit PRBC  -secondary to blood loss anemia secondary to acute blood loss secondary to hematoma in right thigh area from fall while intoxicated. -CBC closely and transfuse as needed, he received 1 unit PRBC 12/27.  -Continue to hold Eliquis   Fall at home Unwitnessed fall in setting of alcohol abuse/wernicke encephalopathy  Head CT with no acute  findings  This is most likely due to alcohol education, he is high risk for fall, -He has an nylon suture under the right eye, will need to be removed in 7 days  Hypokalemia Replaced   Alcohol abuse Very high risk of withdrawal  In progressive Drinks all day scotch and liqour, wife states he has had withdrawal before Continue CIWA protocol    Dysphagia -Seems to be chronic problem, multifactorial mainly the setting of Wernicke's encephalopathy -He is high risk for aspiration -Seen by SLP -Patient is high risk for aspiration, patient aware of this has refused PEG tube multiple times in the past, as well at this point he is not a candidate for it, and I have discussed with wife, she is aware of this, and they do accept these risks of aspiration, so we will continue him on diet.   PAF (paroxysmal atrial fibrillation) (HCC) NSR s/p ablation ILR in situ  He is on Eliquis, he is high risk for fall, and devastating bleed, he is with multiple falls, per wife "I cannot even count how many falls" , so we will continue to hold, and not to resume on discharge Continue amiodarone Hold beta blocker with hypotension    progressive neurologica disorder with pseudobulbar palsy, aphasia, wernicke encephalopathy with continued alcohol use -followed by neurology, recently prescribed  Nuedexta in November of this year  -continues to drink, worsening dysphagia: palliative care  -consider MRI brain, but has ILR in place -discussed GOC  with wife. She can no longer provide care for him at home. SW consult placed -He has DNR order at home, but is telling me is a full code here. 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  EKG/troponin pending    Controlled type 2 diabetes mellitus without complication, without long-term current use of insulin (HCC) A1C of 5.9 in 08/2023 Sensitive SSI and accuchecks QAC/HS  Hold synjardy XR    COPD with asthma (HCC) No signs/symptoms of flair  Continue home medications    Thoracic aortic aneurysm (HCC) 4.3 on CT today, followed annually by cardiology  Stable    Hyperlipidemia Continue crestor    Depression Continue cymbalta   Hypokalemia -Replaced   Goals of care discussion -Palliative care consulted, as as discussed with the wife, patient is intoxicated and drinking all day long, poor life quality.   DVT prophylaxis: SCD's Code Status: Full Family Communication: D/W wife by phone 12/28, none at bedside today Disposition:   Status is: Inpatient    Consultants:  Palliative requested   Subjective:  Significant events as discussed with staff overnight, he is nonverbal, but he does not have any complaints,   Objective: Vitals:   10/02/23 0000 10/02/23 0339 10/02/23 0800 10/02/23 1200  BP: 115/67 118/74 126/67 120/69  Pulse: 94 93 88 87  Resp: 17 20 19 19   Temp:  99.4 F (37.4 C) 98.6 F (37 C) 97.9 F (36.6 C)  TempSrc:  Axillary Axillary Oral  SpO2:   97%   Weight:      Height:        Intake/Output Summary (Last 24 hours) at 10/02/2023 1359 Last data filed at 10/02/2023 0604 Gross per 24 hour  Intake --  Output 900 ml  Net -900 ml   Filed Weights   09/30/23 0134  Weight: 68 kg    Examination:  Awake, nonverbal, able to write, frail, ill-appearing Symmetrical Chest wall movement, Good air movement bilaterally, CTAB RRR,No Gallops,Rubs or new Murmurs, No Parasternal Heave +ve B.Sounds, Abd Soft, No tenderness, No rebound - guarding or rigidity. No edema lower extremity, still with significant right hip bruise, left inner  thigh bruise, please see pictures below               Data Reviewed: I have personally reviewed following labs and imaging studies  CBC: Recent Labs  Lab 09/30/23 0124 09/30/23 0134 09/30/23 0657 09/30/23 0952 09/30/23 1728 10/01/23 0324 10/02/23 0316  WBC 6.8  --   --  5.8 5.5 5.6 6.0  HGB 8.0*   < > 7.4* 7.5* 7.9* 8.0* 8.3*  HCT 24.4*   < > 22.6* 22.7* 23.8* 24.1* 24.8*  MCV 99.2  --   --  98.7 97.1 96.4 95.8  PLT 169  --   --  143* 142* 147* 173   < > = values in this interval not displayed.    Basic Metabolic Panel: Recent Labs  Lab 09/30/23 0124 09/30/23 0134 09/30/23 0952 10/01/23 0324 10/01/23 1409 10/02/23 0316  NA 140 139  --  142  --  141  K 3.2* 3.1*  --  3.3*  --  3.2*  CL 105 106  --  109  --  108  CO2 21*  --   --  24  --  23  GLUCOSE 113* 108*  --  103*  --  95  BUN 18 17  --  11  --  9  CREATININE 1.05 1.00  --  0.89  --  0.85  CALCIUM 8.9  --   --  8.7*  --  8.6*  MG  --   --  2.0  --  1.8  --   PHOS  --   --   --   --   --  2.6    GFR: Estimated Creatinine Clearance: 74.4 mL/min (by C-G formula based on SCr of 0.85 mg/dL).  Liver Function Tests: Recent Labs  Lab 09/30/23 0124  AST 49*  ALT 30  ALKPHOS 40  BILITOT 1.0  PROT 5.8*  ALBUMIN 3.1*    CBG: Recent Labs  Lab 10/01/23 1215 10/01/23 1655 10/01/23 2012 10/02/23 0835 10/02/23 1228  GLUCAP 104* 89 120* 96 100*     No results found for this or any previous visit (from the past 240 hours).       Radiology Studies: No results found.       Scheduled Meds:  sodium chloride   Intravenous Once   amiodarone  100 mg Oral Daily   folic acid  1 mg Oral Daily   insulin aspart  0-9 Units Subcutaneous TID WC   LORazepam  0-4 mg Intravenous Q12H   multivitamin with minerals  1 tablet Oral Daily   pantoprazole (PROTONIX) IV  40 mg Intravenous Q24H   phosphorus  500 mg Oral BID   Continuous Infusions:  potassium chloride 10 mEq (10/02/23 1354)   thiamine  (VITAMIN B1) injection 500 mg (10/02/23 0604)   Followed by   Melene Muller ON 10/04/2023] thiamine (VITAMIN B1) injection       LOS: 2 days       Huey Bienenstock, MD [facility]   To contact the attending provider between 7A-7P or the covering provider during after hours 7P-7A, please log into the web site [email or web address] and access using universal [place] password for that web site. If you do not have the password, please call the hospital operator.  10/02/2023, 1:59 PM

## 2023-10-02 NOTE — Plan of Care (Signed)
°  Problem: Fluid Volume: Goal: Ability to maintain a balanced intake and output will improve Outcome: Progressing   Problem: Nutritional: Goal: Maintenance of adequate nutrition will improve Outcome: Progressing   Problem: Clinical Measurements: Goal: Ability to maintain clinical measurements within normal limits will improve Outcome: Progressing

## 2023-10-03 ENCOUNTER — Ambulatory Visit: Payer: Medicare Other

## 2023-10-03 DIAGNOSIS — S0990XA Unspecified injury of head, initial encounter: Secondary | ICD-10-CM | POA: Diagnosis not present

## 2023-10-03 DIAGNOSIS — S0181XA Laceration without foreign body of other part of head, initial encounter: Secondary | ICD-10-CM | POA: Diagnosis not present

## 2023-10-03 DIAGNOSIS — Z515 Encounter for palliative care: Secondary | ICD-10-CM

## 2023-10-03 DIAGNOSIS — D649 Anemia, unspecified: Secondary | ICD-10-CM | POA: Diagnosis not present

## 2023-10-03 DIAGNOSIS — F1010 Alcohol abuse, uncomplicated: Secondary | ICD-10-CM | POA: Diagnosis not present

## 2023-10-03 DIAGNOSIS — Z789 Other specified health status: Secondary | ICD-10-CM

## 2023-10-03 DIAGNOSIS — R4701 Aphasia: Secondary | ICD-10-CM

## 2023-10-03 DIAGNOSIS — R531 Weakness: Secondary | ICD-10-CM

## 2023-10-03 LAB — GLUCOSE, CAPILLARY
Glucose-Capillary: 147 mg/dL — ABNORMAL HIGH (ref 70–99)
Glucose-Capillary: 133 mg/dL — ABNORMAL HIGH (ref 70–99)
Glucose-Capillary: 190 mg/dL — ABNORMAL HIGH (ref 70–99)
Glucose-Capillary: 92 mg/dL (ref 70–99)

## 2023-10-03 LAB — CBC
HCT: 24.3 % — ABNORMAL LOW (ref 39.0–52.0)
Hemoglobin: 8.3 g/dL — ABNORMAL LOW (ref 13.0–17.0)
MCH: 32.5 pg (ref 26.0–34.0)
MCHC: 34.2 g/dL (ref 30.0–36.0)
MCV: 95.3 fL (ref 80.0–100.0)
Platelets: 177 10*3/uL (ref 150–400)
RBC: 2.55 MIL/uL — ABNORMAL LOW (ref 4.22–5.81)
RDW: 15.8 % — ABNORMAL HIGH (ref 11.5–15.5)
WBC: 7.1 10*3/uL (ref 4.0–10.5)
nRBC: 0.3 % — ABNORMAL HIGH (ref 0.0–0.2)

## 2023-10-03 LAB — BASIC METABOLIC PANEL
Anion gap: 8 (ref 5–15)
BUN: 8 mg/dL (ref 8–23)
CO2: 23 mmol/L (ref 22–32)
Calcium: 8.6 mg/dL — ABNORMAL LOW (ref 8.9–10.3)
Chloride: 106 mmol/L (ref 98–111)
Creatinine, Ser: 0.82 mg/dL (ref 0.61–1.24)
GFR, Estimated: >60 mL/min (ref >60)
Glucose, Bld: 106 mg/dL — ABNORMAL HIGH (ref 70–99)
Potassium: 3.1 mmol/L — ABNORMAL LOW (ref 3.5–5.1)
Sodium: 137 mmol/L (ref 135–145)

## 2023-10-03 LAB — PHOSPHORUS: Phosphorus: 2.9 mg/dL (ref 2.5–4.6)

## 2023-10-03 LAB — MAGNESIUM: Magnesium: 1.8 mg/dL (ref 1.7–2.4)

## 2023-10-03 MED ORDER — POTASSIUM CHLORIDE CRYS ER 20 MEQ PO TBCR
40.0000 meq | EXTENDED_RELEASE_TABLET | Freq: Four times a day (QID) | ORAL | Status: AC
  Administered 2023-10-03 (×2): 40 meq via ORAL
  Filled 2023-10-03 (×2): qty 2

## 2023-10-03 MED ORDER — ENOXAPARIN SODIUM 40 MG/0.4ML IJ SOSY
40.0000 mg | PREFILLED_SYRINGE | INTRAMUSCULAR | Status: DC
  Administered 2023-10-03 – 2023-10-09 (×7): 40 mg via SUBCUTANEOUS
  Filled 2023-10-03 (×7): qty 0.4

## 2023-10-03 MED ORDER — PANTOPRAZOLE SODIUM 40 MG PO TBEC
40.0000 mg | DELAYED_RELEASE_TABLET | Freq: Every day | ORAL | Status: DC
  Administered 2023-10-04 – 2023-10-10 (×7): 40 mg via ORAL
  Filled 2023-10-03 (×5): qty 1
  Filled 2023-10-03: qty 2
  Filled 2023-10-03 (×2): qty 1

## 2023-10-03 NOTE — Progress Notes (Signed)
Physical Therapy Treatment Patient Details Name: Adam [name] MRN: [id number] DOB: December 08, 1950 Today's Date: 10/03/2023   History of Present Illness 72 y.o. male presents to [facility] on 09/30/2023 after a fall at home with head injury. Pt noted to be hypotensive, was consuming alcohol. PMH includes PAF on eliquis, HTN, HLD, CAD, DMII, thoracic AA, alcohol abuse, wernicke encephalopathy.    PT Comments  Pt received in supine and agreeable to session. Pt able to sit EOB and eat pudding, however requires cues for safety due to pt attempting to lay down prior to swallowing and pt becomes frustrated and writing "this is how I swallow" on notepad. Pt able to stand and ambulate increased gait distance with CGA for safety. Pt tends to rush with diminished gait quality and increased instability requiring cues for safety. Pt continues to benefit from PT services to progress toward functional mobility goals.    If plan is discharge home, recommend the following: A little help with walking and/or transfers;A little help with bathing/dressing/bathroom;Assistance with cooking/housework;Assist for transportation;Help with stairs or ramp for entrance   Can travel by private vehicle        Equipment Recommendations  BSC/3in1    Recommendations for Other Services       Precautions / Restrictions Precautions Precautions: Fall Restrictions Weight Bearing Restrictions Per Provider Order: No     Mobility  Bed Mobility Overal bed mobility: Needs Assistance Bed Mobility: Supine to Sit, Sit to Supine     Supine to sit: Min assist, HOB elevated, Mod assist Sit to supine: Min assist   General bed mobility comments: Mod A for trunk elevation and min A for BLE elevation back to EOB    Transfers Overall transfer level: Needs assistance Equipment used: Rolling walker (2 wheels) Transfers: Sit to/from Stand Sit to Stand: Contact guard assist           General transfer comment: From EOB with  CGA for safety and cues for hand placement    Ambulation/Gait Ambulation/Gait assistance: Contact guard assist Gait Distance (Feet): 110 Feet Assistive device: Rolling walker (2 wheels) Gait Pattern/deviations: Step-through pattern, Trunk flexed       General Gait Details: Step-through pattern with cues for upright posture, RW proximity, and pacing due to pt moving quickly. Low foot clearance and some instability requiring CGA for safety, but no LOB       Balance Overall balance assessment: Needs assistance Sitting-balance support: No upper extremity supported, Feet supported Sitting balance-Leahy Scale: Good     Standing balance support: Bilateral upper extremity supported, Reliant on assistive device for balance, During functional activity Standing balance-Leahy Scale: Poor Standing balance comment: with RW support                            Cognition Arousal: Alert Behavior During Therapy: WFL for tasks assessed/performed Overall Cognitive Status: Difficult to assess                                 General Comments: Pt nonverbal, but able to answer questions with gestures and writing on notepad. Pt following most commands, however tends to rush tasks, including eating and ambulation, requiring cues for pacing and safety        Exercises      General Comments General comments (skin integrity, edema, etc.): HR up to 136 with mobility      Pertinent Vitals/Pain  Pain Assessment Pain Assessment: 0-10 Pain Score: 7  Pain Location: R hip Pain Descriptors / Indicators: Grimacing Pain Intervention(s): Monitored during session     PT Goals (current goals can now be found in the care plan section) Acute Rehab PT Goals Patient Stated Goal: to improve activity tolerance, restore independence in mobility PT Goal Formulation: With patient Time For Goal Achievement: 10/15/23 Progress towards PT goals: Progressing toward goals    Frequency     Min 1X/week       AM-PAC PT "6 Clicks" Mobility   Outcome Measure  Help needed turning from your back to your side while in a flat bed without using bedrails?: A Little Help needed moving from lying on your back to sitting on the side of a flat bed without using bedrails?: A Little Help needed moving to and from a bed to a chair (including a wheelchair)?: A Little Help needed standing up from a chair using your arms (e.g., wheelchair or bedside chair)?: A Little Help needed to walk in hospital room?: A Little Help needed climbing 3-5 steps with a railing? : A Lot 6 Click Score: 17    End of Session Equipment Utilized During Treatment: Gait belt Activity Tolerance: Patient tolerated treatment well Patient left: in bed;with call bell/phone within reach;with bed alarm set Nurse Communication: Mobility status PT Visit Diagnosis: Other abnormalities of gait and mobility (R26.89);Muscle weakness (generalized) (M62.81)     Time: 7253-6644 PT Time Calculation (min) (ACUTE ONLY): 32 min  Charges:    $Gait Training: 8-22 mins $Therapeutic Activity: 8-22 mins PT General Charges $$ ACUTE PT VISIT: 1 Visit                     Johny Shock, PTA Acute Rehabilitation Services Secure Chat Preferred  Office:([phone]) [phone]    Johny Shock 10/03/2023, 3:46 PM

## 2023-10-03 NOTE — Consult Note (Cosign Needed Addendum)
Consultation Note Date: 10/03/2023   Patient Name: Adam [name]  DOB: 1951-06-26  MRN: [id number]  Age / Sex: 72 y.o., male  PCP: Kristian Covey, MD Referring Physician: Starleen Arms, MD  Reason for Consultation: Establishing goals of care, "GOC. asprating, refuses PEG, can not eat. 100+ weight loss, frequent falls, continues to drink "  HPI/Patient Profile: 72 y.o. male  with past medical history of PAF on eliquis, HTN, HLD, CAD, T2DM, thoracic AA, OSA, alcohol abuse with hx of wernicke encephalopathy and aphasia, pseudobulbar palsy, non verbal state was admitted on 09/30/2023 with initial encounter fall at home on blood thinner with head injury, acute on chronic anemia, hypokalemia, dysphagia, progressive neurological disorder with pseudobulbar palsy, aphasia, Warnicke encephalopathy.   Clinical Assessment and Goals of Care: I have reviewed medical records including EPIC notes, labs, any available advanced directives, and imaging. Received report from primary RN -no acute concerns.  RN reports patient is nonverbal but is able to communicate with/marker, head gestures, thumbs up/down.    Went to visit patient at bedside - no family/visitors present. Patient was lying in bed asleep -he easily wakes to voice/gentle touch.  He is alert, answers questions appropriately with thumbs up/down, and gestures. No signs or non-verbal gestures of pain or discomfort noted. No respiratory distress, increased work of breathing, or secretions noted. Patient endorses pain in his right foot which is relieved with as needed pain medication.  I introduced Palliative Medicine as specialized medical care for people living with serious illness. It focuses on providing relief from the symptoms and stress of a serious illness. The goal is to improve quality of life for both the patient and the family.  Patient indicates that he  wishes his wife to be present for full goals of care conversation and gives permission to call and schedule meeting.  Called patient's wife/[name] -emotional support provided.  Therapeutic listening provided as she expresses concerns over possibility patient may return home.  She states "if you send him home all he is going to do is drink; he cannot control himself and drinks nonstop."  She also states "home is not the best option.... It is not safe for him or me."  Reviewed that I would make TOC aware of her concerns.  We briefly reviewed LTC option in context of his insurance per her request. Discussed this would also be a conversation patient needs to be involved in but per previous PT notes LTC is not what he needs - they recommend home with [facility]. She is hopeful to continue these discussions during in-person meeting - she request to meet tomorrow 12/31 at 12 PM.  Discussed with patient/family the importance of continued conversation with each other and the medical providers regarding overall plan of care and treatment options, ensuring decisions are within the context of the patient's values and GOCs.    Questions and concerns were addressed. The patient/family was encouraged to call with questions and/or concerns. PMT number was provided.   Primary Decision Maker: PATIENT    SUMMARY OF RECOMMENDATIONS   Continue current plan of care Continue full code at this time In-person GOC meeting scheduled to include patient and his wife for tomorrow 12/30 at 12 PM [facility] consulted for: wife's concerns she can no longer care for patient at home PMT will continue to follow and support holistically   Code Status/Advance Care Planning: Full code  Palliative Prophylaxis:  Aspiration, Frequent Pain Assessment, Oral Care, and Turn Reposition  Additional Recommendations (Limitations, Scope,  Preferences): Full Scope Treatment  Psycho-social/Spiritual:  Desire for further Chaplaincy support:no Created  space and opportunity for patient and family to express thoughts and feelings regarding patient's current medical situation.  Emotional support and therapeutic listening provided.  Prognosis:  Unable to determine  Discharge Planning: To Be Determined      Primary Diagnoses: Present on Admission:  Acute on chronic anemia   I have reviewed the medical record, interviewed the patient and family, and examined the patient. The following aspects are pertinent.  History reviewed. No pertinent past medical history. Social History   Socioeconomic History   Marital status: Married    Spouse name: Not on file   Number of children: Not on file   Years of education: Not on file   Highest education level: Not on file  Occupational History   Not on file  Tobacco Use   Smoking status: Never   Smokeless tobacco: Never  Substance and Sexual Activity   Alcohol use: Not on file   Drug use: Not on file   Sexual activity: Not on file  Other Topics Concern   Not on file  Social History Narrative   Not on file   Social Drivers of Health   Financial Resource Strain: Not on file  Food Insecurity: Patient Unable To Answer (09/30/2023)   Hunger Vital Sign    Worried About Running Out of Food in the Last Year: Patient unable to answer    Ran Out of Food in the Last Year: Patient unable to answer  Transportation Needs: Patient Unable To Answer (09/30/2023)   PRAPARE - Transportation    Lack of Transportation (Medical): Patient unable to answer    Lack of Transportation (Non-Medical): Patient unable to answer  Physical Activity: Not on file  Stress: Not on file  Social Connections: Not on file   History reviewed. No pertinent family history. Scheduled Meds:  sodium chloride   Intravenous Once   amiodarone  100 mg Oral Daily   folic acid  1 mg Oral Daily   insulin aspart  0-9 Units Subcutaneous TID WC   LORazepam  0-4 mg Intravenous Q12H   multivitamin with minerals  1 tablet Oral Daily    [START ON 10/04/2023] pantoprazole  40 mg Oral Daily   potassium chloride  40 mEq Oral Q6H   Continuous Infusions:  thiamine (VITAMIN B1) injection 500 mg (10/03/23 0554)   Followed by   Melene Muller ON 10/04/2023] thiamine (VITAMIN B1) injection     PRN Meds:.acetaminophen **OR** acetaminophen, HYDROcodone-acetaminophen, morphine injection, ondansetron **OR** ondansetron (ZOFRAN) IV Medications Prior to Admission:  Prior to Admission medications   Medication Sig Start Date End Date Taking? Authorizing Provider  acetaminophen (TYLENOL) 500 MG tablet Take 500 mg by mouth every 6 (six) hours as needed for mild pain (pain score 1-3) or moderate pain (pain score 4-6).   Yes [provider]  allopurinol (ZYLOPRIM) 300 MG tablet Take 300 mg by mouth daily. 09/10/23  Yes [provider]  amiodarone (PACERONE) 200 MG tablet Take 200 mg by mouth daily. 09/13/23  Yes [provider]  cyanocobalamin (VITAMIN B12) 500 MCG tablet Take 500 mcg by mouth daily.   Yes [provider]  DULoxetine (CYMBALTA) 60 MG capsule Take 60 mg by mouth daily. 08/22/23  Yes [provider]  ELIQUIS 5 MG TABS tablet Take 5 mg by mouth 2 (two) times daily. 08/05/23  Yes [provider]  gabapentin (NEURONTIN) 300 MG capsule Take 600 mg by mouth at  bedtime as needed. 08/29/23  Yes [provider]  ipratropium (ATROVENT) 0.03 % nasal spray Place 2 sprays into both nostrils 3 (three) times daily. 08/29/23  Yes [provider]  lisinopril (ZESTRIL) 20 MG tablet Take 20 mg by mouth daily. 09/29/23  Yes [provider]  metoprolol tartrate (LOPRESSOR) 50 MG tablet Take 50 mg by mouth 2 (two) times daily. 08/22/23  Yes [provider]  NUEDEXTA 20-10 MG capsule Take 1 capsule by mouth daily. 08/31/23  Yes [provider]  PREVIDENT 5000 ENAMEL PROTECT 1.1-5 % GEL USE TO BRUSH TEETH FOR 2 MINUTES TWICE DAILY THEN SPIT OUT AFTER. NO FOOD OR  DRINK FOR 30 MINUTES AFTER 04/11/23  Yes [provider]  rosuvastatin (CRESTOR) 40 MG tablet Take 40 mg by mouth daily. 09/10/23  Yes [provider]  SKYRIZI PEN 150 MG/ML pen Inject into the skin. 08/23/23  Yes [provider]  SYNJARDY XR 25-1000 MG TB24 Take 1 tablet by mouth daily. 09/12/23  Yes [provider]   No Known Allergies Review of Systems  Constitutional:  Negative for activity change and appetite change.  Respiratory:  Negative for shortness of breath.   Gastrointestinal:  Negative for nausea and vomiting.  Neurological:  Positive for weakness.  All other systems reviewed and are negative.   Physical Exam Vitals and nursing note reviewed.  Constitutional:      General: He is not in acute distress.    Appearance: He is ill-appearing.  Pulmonary:     Effort: No respiratory distress.  Skin:    General: Skin is warm and dry.  Neurological:     Mental Status: He is alert.     Motor: Weakness present.  Psychiatric:        Attention and Perception: Attention normal.        Behavior: Behavior is cooperative.        Cognition and Memory: Cognition and memory normal.     Vital Signs: BP 119/66 (BP Location: Right Arm)   Pulse 93   Temp 99 F (37.2 C) (Oral)   Resp (!) 21   Ht 5\' 10"  (1.778 m)   Wt 68 kg   SpO2 96%   BMI 21.52 kg/m  Pain Scale: 0-10 POSS *See Group Information*: S-Acceptable,Sleep, easy to arouse Pain Score: 3    SpO2: SpO2: 96 % O2 Device:SpO2: 96 % O2 Flow Rate: .   IO: Intake/output summary:  Intake/Output Summary (Last 24 hours) at 10/03/2023 1252 Last data filed at 10/03/2023 0403 Gross per 24 hour  Intake 583.33 ml  Output 750 ml  Net -166.67 ml    LBM: Last BM Date : 09/30/23 Baseline Weight: Weight: 68 kg Most recent weight: Weight: 68 kg     Palliative Assessment/Data: PPS 50-60%     Time In: 1215 Time Out: 1315 Time Total: 60 minutes  Signed by: Haskel Khan, NP   Please  contact Palliative Medicine Team phone at [phone] for questions and concerns.  For individual provider: See Amion  *Portions of this note are a verbal dictation therefore any spelling and/or grammatical errors are due to the "[employer]agon [employer] One" system interpretation.

## 2023-10-03 NOTE — Plan of Care (Signed)
  Problem: Coping: Goal: Ability to adjust to condition or change in health will improve Outcome: Progressing   Problem: Fluid Volume: Goal: Ability to maintain a balanced intake and output will improve Outcome: Progressing   Problem: Metabolic: Goal: Ability to maintain appropriate glucose levels will improve Outcome: Progressing   Problem: Health Behavior/Discharge Planning: Goal: Ability to manage health-related needs will improve Outcome: Progressing   Problem: Clinical Measurements: Goal: Ability to maintain clinical measurements within normal limits will improve Outcome: Progressing

## 2023-10-03 NOTE — Progress Notes (Signed)
PHARMACIST - PHYSICIAN COMMUNICATION  DR:   Elgergawy  CONCERNING: IV to Oral Route Change Policy  RECOMMENDATION: This patient is receiving Protonix by the intravenous route.  Based on criteria approved by the Pharmacy and Therapeutics Committee, the intravenous medication(s) is/are being converted to the equivalent oral dose form(s).   DESCRIPTION: These criteria include: The patient is eating (either orally or via tube) and/or has been taking other orally administered medications for a least 24 hours The patient has no evidence of active gastrointestinal bleeding or impaired GI absorption (gastrectomy, short bowel, patient on TNA or NPO).  If you have questions about this conversion, please contact the Pharmacy Department  []   [phone] )  [facility] []   [phone] )  [facility] [x]   [phone] )  [facility] []   [phone] )  [facility] []   [phone] )  [facility]

## 2023-10-03 NOTE — Plan of Care (Signed)
  Problem: Fluid Volume: Goal: Ability to maintain a balanced intake and output will improve Outcome: Progressing   Problem: Metabolic: Goal: Ability to maintain appropriate glucose levels will improve Outcome: Progressing   Problem: Nutritional: Goal: Maintenance of adequate nutrition will improve Outcome: Progressing   Problem: Skin Integrity: Goal: Risk for impaired skin integrity will decrease Outcome: Progressing   Problem: Tissue Perfusion: Goal: Adequacy of tissue perfusion will improve Outcome: Progressing   Problem: Clinical Measurements: Goal: Diagnostic test results will improve Outcome: Progressing Goal: Respiratory complications will improve Outcome: Progressing Goal: Cardiovascular complication will be avoided Outcome: Progressing   Problem: Elimination: Goal: Will not experience complications related to urinary retention Outcome: Progressing   Problem: Pain Management: Goal: General experience of comfort will improve Outcome: Progressing

## 2023-10-03 NOTE — Progress Notes (Signed)
Mobility Specialist Progress Note;   10/03/23 0910  Mobility  Activity Transferred from bed to chair  Level of Assistance Moderate assist, patient does 50-74%  Assistive Device Other (Comment) (HHA)  Distance Ambulated (ft) 3 ft  Activity Response Tolerated well  Mobility Referral Yes  Mobility visit 1 Mobility  Mobility Specialist Start Time (ACUTE ONLY) 0910  Mobility Specialist Stop Time (ACUTE ONLY) 0935  Mobility Specialist Time Calculation (min) (ACUTE ONLY) 25 min   Pt agreeable to mobility. Required MinA for bed mobility and ModA for STS and transfer from bed to chair. VSS throughout and no c/o when asked. Pt left comfortably in chair with all needs met. RN notified.   Caesar Bookman Mobility Specialist Please contact via SecureChat or [employer] [phone]

## 2023-10-03 NOTE — Progress Notes (Addendum)
PROGRESS NOTE    Adam [name]  OJJ:[id number] DOB: 12/24/1950 DOA: 09/30/2023 PCP: Kristian Covey, MD   No chief complaint on file.   Brief Narrative:    Adam [name] is a 72 y.o. male with medical history significant of PAF on eliquis, HTN, HLD, CAD, T2DM, thoracic AA, OSA, alcohol abuse with hx of wernicke encephalopathy and aphasia, pseudobulbar palsy, non verbal state who presented to ED after a fall at home with head injury. He was hypotensive per EMS to 80s on arrival and improved with 500cc bolus to SBP >100. He was drinking. Able to write his left leg gave out and he fell down and hit his head.  Patient with heavy drinking, drinks all day, he drinks scotch or vodka, workup was significant for acute blood loss anemia, admitted for further workup - In ED: started on CIWA protocol, right cheek laceration repaired with 1 suture (nylon)     Assessment & Plan:   Principal Problem:   Acute on chronic anemia Active Problems:   Fall at home, initial encounter   Alcohol abuse   Hypokalemia   Dysphagia   PAF (paroxysmal atrial fibrillation) (HCC)   progressive neurologica disorder with pseudobulbar palsy, aphasia, wernicke encephalopathy with continued alcohol use   Essential hypertension   CAD (coronary artery disease)   Controlled type 2 diabetes mellitus without complication, without long-term current use of insulin (HCC)   COPD with asthma (HCC)   Thoracic aortic aneurysm (HCC)   Hyperlipidemia   Depression   OSA (obstructive sleep apnea)   Wernicke encephalopathy   Acute blood loss anemia -Baseline hemoglobin at 11, down to 8 on admission, and continued to trend down, so he received 1 unit PRBC  -secondary to blood loss anemia secondary to acute blood loss secondary to hematoma in right thigh area from fall while intoxicated. -CBC closely and transfuse as needed, he received 1 unit PRBC 12/27.  -Continue to hold Eliquis   Fall at home Unwitnessed fall in  setting of alcohol abuse/wernicke encephalopathy  Head CT with no acute findings  This is most likely due to alcohol education, he is high risk for fall, -He has an nylon suture under the right eye, will need to be removed in 7 days  Hypokalemia 3.1 today, replaced   Alcohol abuse Very high risk of withdrawal  In progressive Drinks all day scotch and liqour, wife states he has had withdrawal before Continue CIWA protocol    Dysphagia -Seems to be chronic problem, multifactorial mainly the setting of Wernicke's encephalopathy -He is high risk for aspiration -Seen by SLP -Patient is high risk for aspiration, patient aware of this has refused PEG tube multiple times in the past, as well at this point he is not a candidate for it, and I have discussed with wife, she is aware of this, and they do accept these risks of aspiration, so we will continue him on diet.   PAF (paroxysmal atrial fibrillation) (HCC) NSR s/p ablation ILR in situ  He is on Eliquis, he is high risk for fall, and devastating bleed, he is with multiple falls, per wife "I cannot even count how many falls" , so we will continue to hold, and not to resume on discharge Continue amiodarone Hold beta blocker with hypotension    progressive neurologica disorder with pseudobulbar palsy, aphasia, wernicke encephalopathy with continued alcohol use -followed by neurology, recently prescribed  Nuedexta in November of this year  -continues to drink, worsening dysphagia: palliative care  -  consider MRI brain, but has ILR in place -discussed GOC with wife. She can no longer provide care for him at home. SW consult placed -He has DNR order at home, but is telling me is a full code here. Again, GOC need to be discussed further, palliative consult pending    Essential hypertension Blood pressure have been soft, continue to hold blood pressure medications for now Continue IVF  CAD (coronary artery disease) multiple stent  placements CABG x 6 in [place] in 2011 (LIMA to LAD, RIMA to diagonal, left radial artery to circumflex, SVG to PDA, SVG to PL SA, and questionable SVG  No chest pain Continue medical management.  EKG/troponin pending    Controlled type 2 diabetes mellitus without complication, without long-term current use of insulin (HCC) A1C of 5.9 in 08/2023 Sensitive SSI and accuchecks QAC/HS  Hold synjardy XR    COPD with asthma (HCC) No signs/symptoms of flair  Continue home medications    Thoracic aortic aneurysm (HCC) 4.3 on CT today, followed annually by cardiology  Stable    Hyperlipidemia Continue crestor    Depression Continue cymbalta   Hypokalemia -Replaced   Goals of care discussion -Palliative care consulted, as as discussed with the wife, patient is intoxicated and drinking all day long, poor life quality.   DVT prophylaxis: SCD's Code Status: Full Family Communication: D/W wife by phone 12/28, none at bedside today Disposition:   Status is: Inpatient    Consultants:  Palliative requested   Subjective:  Significant events as discussed with staff overnight, he is nonverbal, but he does not have any complaints,   Objective: Vitals:   10/03/23 0017 10/03/23 0353 10/03/23 0748 10/03/23 1138  BP: 120/67 119/66    Pulse: 85 93    Resp: 17 (!) 21    Temp: 98.3 F (36.8 C) 99.6 F (37.6 C) 99 F (37.2 C)   TempSrc: Axillary Axillary Oral Oral  SpO2: 98% 96%    Weight:      Height:        Intake/Output Summary (Last 24 hours) at 10/03/2023 1409 Last data filed at 10/03/2023 0403 Gross per 24 hour  Intake 583.33 ml  Output 750 ml  Net -166.67 ml   Filed Weights   09/30/23 0134  Weight: 68 kg    Examination:  Awake well, deconditioned, nonverbal, but able to write, he is a frail, chronically ill appearing, appears much older than stated age Symmetrical Chest wall movement, Good air movement bilaterally, CTAB RRR,No Gallops,Rubs or new Murmurs,  No Parasternal Heave +ve B.Sounds, Abd Soft, No tenderness, No rebound - guarding or rigidity. No Cyanosis, significant change on right hip, left inner thigh bruise    Data Reviewed: I have personally reviewed following labs and imaging studies  CBC: Recent Labs  Lab 09/30/23 0952 09/30/23 1728 10/01/23 0324 10/02/23 0316 10/03/23 0447  WBC 5.8 5.5 5.6 6.0 7.1  HGB 7.5* 7.9* 8.0* 8.3* 8.3*  HCT 22.7* 23.8* 24.1* 24.8* 24.3*  MCV 98.7 97.1 96.4 95.8 95.3  PLT 143* 142* 147* 173 177    Basic Metabolic Panel: Recent Labs  Lab 09/30/23 0124 09/30/23 0134 09/30/23 0952 10/01/23 0324 10/01/23 1409 10/02/23 0316 10/03/23 0447  NA 140 139  --  142  --  141 137  K 3.2* 3.1*  --  3.3*  --  3.2* 3.1*  CL 105 106  --  109  --  108 106  CO2 21*  --   --  24  --  23 23  GLUCOSE 113* 108*  --  103*  --  95 106*  BUN 18 17  --  11  --  9 8  CREATININE 1.05 1.00  --  0.89  --  0.85 0.82  CALCIUM 8.9  --   --  8.7*  --  8.6* 8.6*  MG  --   --  2.0  --  1.8  --  1.8  PHOS  --   --   --   --   --  2.6 2.9    GFR: Estimated Creatinine Clearance: 77.2 mL/min (by C-G formula based on SCr of 0.82 mg/dL).  Liver Function Tests: Recent Labs  Lab 09/30/23 0124  AST 49*  ALT 30  ALKPHOS 40  BILITOT 1.0  PROT 5.8*  ALBUMIN 3.1*    CBG: Recent Labs  Lab 10/02/23 1228 10/02/23 1553 10/02/23 2042 10/03/23 0746 10/03/23 1134  GLUCAP 100* 109* 145* 147* 133*     No results found for this or any previous visit (from the past 240 hours).       Radiology Studies: No results found.       Scheduled Meds:  sodium chloride   Intravenous Once   amiodarone  100 mg Oral Daily   folic acid  1 mg Oral Daily   insulin aspart  0-9 Units Subcutaneous TID WC   LORazepam  0-4 mg Intravenous Q12H   multivitamin with minerals  1 tablet Oral Daily   [START ON 10/04/2023] pantoprazole  40 mg Oral Daily   potassium chloride  40 mEq Oral Q6H   Continuous Infusions:  thiamine  (VITAMIN B1) injection 500 mg (10/03/23 0554)   Followed by   Melene Muller ON 10/04/2023] thiamine (VITAMIN B1) injection       LOS: 3 days       Huey Bienenstock, MD [facility]   To contact the attending provider between 7A-7P or the covering provider during after hours 7P-7A, please log into the web site [email or web address] and access using universal [place] password for that web site. If you do not have the password, please call the hospital operator.  10/03/2023, 2:09 PM

## 2023-10-03 NOTE — Progress Notes (Addendum)
Speech Language Pathology Treatment: Dysphagia  Patient Details Name: Adam [name] MRN: [id number] DOB: January 13, 1951 Today's Date: 10/03/2023 Time: 1191-4782 SLP Time Calculation (min) (ACUTE ONLY): 14 min  Assessment / Plan / Recommendation Clinical Impression  At baseline pt eats with known aspiration risks prior to admission He is anarthric, doesn't move his tongue and uses gravity to transit po's and uses finger to push pill to posterior oral cavity and uses applesauce. He told initial therapist that he lies down to eat however with RN and this SLP he was upright in chair. RN stated he was able to take pill this morning. Today with SLP he had a hard time transiting applesauce and said he swallowed a small amount. He had anterior spill with residue behind lower lip. He was able to transit thin via cup with anterior spill and delayed cough  Discussed upgrading his diet to puree with reservation that the puree will be thicker and suspect he will have difficulty transiting. He wrote for SLP that he can eat puddings and ice cream and prefers these. Pt agrees to continue with full liquid diet and SLP will attempt to reach out to dietitian to see if he can have pudding and ice cream with each meal.  Will continue to follow briefly but may be close to discharge if cannot upgrade texture.   Addendum: RD state she would assist with pt having pudding and ice cream on his trays.    HPI HPI: Adam [name] is a 72 y.o. male with medical history significant of PAF on eliquis, HTN, HLD, CAD, T2DM, thoracic AA, OSA, alcohol abuse with hx of wernicke encephalopathy and aphasia, pseudobulbar palsy, non verbal state who presented to ED after a fall at home with head injury. He was hypotensive per EMS to 80s on arrival and improved with 500cc bolus to SBP >100. He was drinking. Able to write his left leg gave out and he fell down and hit his head. Lost 100 pounds over last 1.5 years   MBSS attempted last month,  but aborted as  he could not complete  Many discussions about G tube, but he has been adamant that he does not want this.  There is a much more elaborate history that is not available until charts merge.      SLP Plan  Continue with current plan of care      Recommendations for follow up therapy are one component of a multi-disciplinary discharge planning process, led by the attending physician.  Recommendations may be updated based on patient status, additional functional criteria and insurance authorization.    Recommendations  Diet recommendations: Thin liquid;Other(comment) (full liquids) Liquids provided via: Cup Medication Administration: Whole meds with puree Supervision: Patient able to self feed                  Oral care BID   Intermittent Supervision/Assistance Dysphagia, unspecified (R13.10)     Continue with current plan of care     Royce Macadamia  10/03/2023, 10:04 AM

## 2023-10-04 ENCOUNTER — Inpatient Hospital Stay: Payer: Medicare Other | Admitting: Family Medicine

## 2023-10-04 DIAGNOSIS — D649 Anemia, unspecified: Secondary | ICD-10-CM | POA: Diagnosis not present

## 2023-10-04 DIAGNOSIS — Z7189 Other specified counseling: Secondary | ICD-10-CM | POA: Diagnosis not present

## 2023-10-04 DIAGNOSIS — Z515 Encounter for palliative care: Secondary | ICD-10-CM | POA: Diagnosis not present

## 2023-10-04 LAB — BASIC METABOLIC PANEL
Anion gap: 10 (ref 5–15)
BUN: 9 mg/dL (ref 8–23)
CO2: 23 mmol/L (ref 22–32)
Calcium: 9 mg/dL (ref 8.9–10.3)
Chloride: 108 mmol/L (ref 98–111)
Creatinine, Ser: 0.78 mg/dL (ref 0.61–1.24)
GFR, Estimated: >60 mL/min (ref >60)
Glucose, Bld: 91 mg/dL (ref 70–99)
Potassium: 3.4 mmol/L — ABNORMAL LOW (ref 3.5–5.1)
Sodium: 141 mmol/L (ref 135–145)

## 2023-10-04 LAB — CBC
HCT: 26.7 % — ABNORMAL LOW (ref 39.0–52.0)
Hemoglobin: 8.7 g/dL — ABNORMAL LOW (ref 13.0–17.0)
MCH: 32 pg (ref 26.0–34.0)
MCHC: 32.6 g/dL (ref 30.0–36.0)
MCV: 98.2 fL (ref 80.0–100.0)
Platelets: 187 10*3/uL (ref 150–400)
RBC: 2.72 MIL/uL — ABNORMAL LOW (ref 4.22–5.81)
RDW: 16.4 % — ABNORMAL HIGH (ref 11.5–15.5)
WBC: 6.4 10*3/uL (ref 4.0–10.5)
nRBC: 0 % (ref 0.0–0.2)

## 2023-10-04 LAB — GLUCOSE, CAPILLARY
Glucose-Capillary: 128 mg/dL — ABNORMAL HIGH (ref 70–99)
Glucose-Capillary: 194 mg/dL — ABNORMAL HIGH (ref 70–99)
Glucose-Capillary: 151 mg/dL — ABNORMAL HIGH (ref 70–99)
Glucose-Capillary: 111 mg/dL — ABNORMAL HIGH (ref 70–99)

## 2023-10-04 MED ORDER — DOCUSATE SODIUM 100 MG PO CAPS
100.0000 mg | ORAL_CAPSULE | Freq: Two times a day (BID) | ORAL | Status: DC
Start: 2023-10-04 — End: 2023-10-10
  Administered 2023-10-04 – 2023-10-10 (×11): 100 mg via ORAL
  Filled 2023-10-04 (×11): qty 1

## 2023-10-04 MED ORDER — ENSURE ENLIVE PO LIQD
237.0000 mL | Freq: Two times a day (BID) | ORAL | Status: DC
  Administered 2023-10-04 – 2023-10-07 (×7): 237 mL via ORAL

## 2023-10-04 MED ORDER — POLYETHYLENE GLYCOL 3350 17 G PO PACK: 17.0000 g | PACK | Freq: Every day | ORAL | Status: DC

## 2023-10-04 MED ORDER — POLYETHYLENE GLYCOL 3350 17 G PO PACK
17.0000 g | PACK | Freq: Every day | ORAL | Status: DC | PRN
  Administered 2023-10-04: 17 g via ORAL

## 2023-10-04 MED ORDER — POTASSIUM CHLORIDE CRYS ER 10 MEQ PO TBCR
30.0000 meq | EXTENDED_RELEASE_TABLET | Freq: Three times a day (TID) | ORAL | Status: AC
  Administered 2023-10-04 (×2): 30 meq via ORAL
  Filled 2023-10-04 (×2): qty 1

## 2023-10-04 NOTE — Progress Notes (Addendum)
 Initial Nutrition Assessment  DOCUMENTATION CODES:   Severe malnutrition in context of chronic illness  INTERVENTION:  Ice cream and pudding at each meal.  Continue with current diet. Ensure Plus High Protein po BID, each supplement provides 350 kcal and 20 grams of protein.    NUTRITION DIAGNOSIS:   Severe Malnutrition related to chronic illness as evidenced by severe fat depletion, severe muscle depletion.    GOAL:   Patient will meet greater than or equal to 90% of their needs    MONITOR:   PO intake, Supplement acceptance  REASON FOR ASSESSMENT:   Malnutrition Screening Tool    ASSESSMENT:    72 y.o. M, Presented from home to ED via EMS after fall at home with head injury. Admitted with acute chronic anemia.  PMH; PAF on eliquis , HTN, HLD, CAD, T2DM, thoracic AA, OSA, alcohol abuse with hx of wernicke encephalopathy and aphasia, pseudobulbar palsy, non verbal. SLP reached out to RD Pt requesting Ice cream and pudding. Pt non verbal but able to communicate through hand jesters and writing. Good appetite, with no changes. No weight loss that he knows of. Wanted  Ice cream and pudding added to meals.    Admit weight: 68 kg    Average Meal Intake: No current documentation.   Nutritionally Relevant Medications: Scheduled Meds:  folic acid   1 mg Oral Daily   LORazepam   0-4 mg Intravenous Q12H   multivitamin with minerals  1 tablet Oral Daily   potassium chloride   30 mEq Oral Q8H    Continuous Infusions:  thiamine  (VITAMIN B1) injection 500 mg (10/04/23 0904)   PRN Meds:.acetaminophen  **OR** acetaminophen , HYDROcodone -acetaminophen , morphine  injection, ondansetron  **OR** ondansetron  (ZOFRAN ) IV  Labs Reviewed    NUTRITION - FOCUSED PHYSICAL EXAM:  Flowsheet Row Most Recent Value  Orbital Region Severe depletion  Upper Arm Region Severe depletion  Thoracic and Lumbar Region Severe depletion  Buccal Region Moderate depletion  Temple Region Severe  depletion  Clavicle Bone Region Severe depletion  Clavicle and Acromion Bone Region Severe depletion  Scapular Bone Region Moderate depletion  Dorsal Hand Moderate depletion  Patellar Region Severe depletion  Anterior Thigh Region Severe depletion  Posterior Calf Region Severe depletion  Edema (RD Assessment) Mild  Hair Reviewed  Eyes Reviewed  Mouth Reviewed  Skin Reviewed  Nails Reviewed       Diet Order:   Diet Order             Diet full liquid Room service appropriate? No; Fluid consistency: Thin  Diet effective now                   EDUCATION NEEDS:   Education needs have been addressed  Skin:  Skin Assessment: Reviewed RN Assessment  Last BM:  12/27  Height:   Ht Readings from Last 1 Encounters:  09/30/23 5' 10 (1.778 m)    Weight:   Wt Readings from Last 1 Encounters:  09/30/23 68 kg    Ideal Body Weight:     BMI:  Body mass index is 21.52 kg/m.  Estimated Nutritional Needs:   Kcal:  2050-2400 kcal  Protein:  90-105 g  Fluid:  30ml/kcal    Jenna Pew RDN, LDN Clinical Dietitian   If unable to reach, please contact RD Inpatient secure chat group between 8 am-4 pm daily

## 2023-10-04 NOTE — TOC Initial Note (Addendum)
 Transition of Care [facility]) - Initial/Assessment Note    Patient Details  Name: Adam [name] MRN: [id number] Date of Birth: 1951/07/17  Transition of Care [facility]) CM/SW Contact:    Inocente GORMAN Kindle, LCSW Phone Number: 10/04/2023, 4:38 PM  Clinical Narrative:                 CSW received consult from MD and palliative for possible SNF placement at time of discharge due to wife being unable to care for patient anymore. CSW spoke with patient's spouse. She expressed understanding of PT recommendation of home health but is requesting long term SNF placement. Patient does have Medicaid secondary and has been in long term care before. CSW will send out referrals for review and provide bed offers as available. [facility] reviewing patient's Medicaid with their business office. [facility] would require patient to pay $1000 up front until the Medicaid kicks in. No long term beds at [place], [place], or [facility]. Will continue to follow.    Skilled Nursing Rehab Facilities-   [email or web address]   Ratings out of 5 stars (5 the highest)   Name Address  Phone # Quality Care Staffing Health Inspection Overall  [facility] [address] [phone] 2 2 5 5   [facility] [address], [place] [phone] 4 2 4 4   [facility] [address], [place] [phone] 2 1 2 1   [facility] [address] [place] [phone] 3 1 4 3   [facility]  [address], [place] [phone] 4 4 5 5   [facility] [address], [facility] [phone] 3 2 2 2   [facility] [address] [place] [phone] 5 1 2 2   [facility] [address] [place] [phone] 1 1 3 1   [address] ([place]) [address], [place] [phone] 2 2 2 2   [facility] [address] [place], [place] [phone] 2 2 1 1   [facility] ([place]) [address], [place]  [phone] 3 1 1 1   Clotilda Pereyra [address] [place] [phone] 3 3 4 4   [facility] [address], [place] [phone] 2 2 3 3           [facility] [address], [place] [phone] 4 2 1 1   [facility], [place] [address], [place] [phone] 1 1 2 1   [facility] [address] [place] [phone] 2 1 4 3   [facility] [address] [phone] 2 1 4 3   [facility] [address] [place] [phone] 2 3 3 3           [address] (no [facility]) [address], [place] [phone] 4 5 5 5   [facility] (No [employer]) [address] [facility] [phone] 1 2 4 3   [facility] [address], [place] [phone] 2 1 2 1   [facility] (No [employer]) [address], [place] [phone] 4 1 5 4   [facility] [address], [place] [phone] 3 4 2 2   [facility] [address], [place] [phone] 2 1 1 1   [place] [address] [place] [phone] 4 2 5 5   [facility]  [address], [place] [phone] 2 2 3 3   [facility] [address], [place] [phone] 4 1 1 1   [facility] [address] [place], [place] [phone] 2 2 3  3  [facility] [address] [place] [phone] 2 1 1 1   [facility] [address], [place]  [phone] 3 3 3 3   [facility] (No Humana) [address], [place] [phone] 2 2 4 4    [facility]) [address], [place] [phone] 2 1 1 1   [facility] [address] [place] [phone] 4 3 5 5   [facility] [address], [place] [phone] 1 1 1 1           [facility] [address] [place], [place] [phone] 5 4 5 5   [facility])  [address] [place] [phone] 1 1 2 1   [facility]) [address] [place], [place] [phone]  2 4 4   [facility]  [address], [place] [phone] 3 5 5 5   [address] [place], [place] [phone] 4 2 2 2   [facility]) [address] [place] [phone] 2 1 3 2        Barriers to Discharge: Continued Medical Work up, English As A Second Language Teacher, SNF Pending bed offer   Patient Goals and CMS Choice Patient states their goals for this hospitalization and ongoing recovery are:: LTC placement CMS Medicare.gov Compare Post Acute Care list provided to:: Patient Represenative (must comment) Choice offered to / list presented to : Spouse  ownership interest in [facility].provided to:: Spouse    Expected Discharge Plan and Services In-house Referral: Clinical Social Work, Hospice / Palliative Care   Post Acute Care Choice: Nursing Home Living arrangements for the past 2 months: Single Family Home                                      Prior Living Arrangements/Services Living arrangements for the past 2 months: Single Family Home Lives with:: Spouse Patient language and need for interpreter reviewed:: Yes Do you feel safe going back to the place where you live?: Yes      Need for Family Participation in Patient Care: Yes (Comment) Care giver support system in place?: Yes (comment)   Criminal Activity/Legal Involvement Pertinent to Current Situation/Hospitalization: No - Comment as needed  Activities of Daily Living   ADL Screening (condition at time of admission) Independently performs ADLs?: Yes (appropriate for developmental age) Is the patient deaf or have difficulty hearing?: Yes Does the patient have difficulty seeing, even when wearing glasses/contacts?: No Does the patient have difficulty concentrating, remembering, or making decisions?: Yes  Permission Sought/Granted Permission sought to share information with : Facility Medical Sales Representative, Family Supports Permission granted to share information with :  Yes, Verbal Permission Granted  Share Information with NAME: [name]  Permission granted to share info w AGENCY: SNFs  Permission granted to share info w Relationship: Wife  Permission granted to share info w Contact Information: [phone]  Emotional Assessment Appearance:: Appears stated age     Orientation: : Oriented to Self, Oriented to Place, Oriented to  Time, Oriented to Situation Alcohol / Substance Use: Not Applicable Psych Involvement: No (comment)  Admission diagnosis:  Alcohol abuse [F10.10] Facial laceration, initial encounter [S01.81XA] Injury of head, initial encounter [S09.90XA] Nonverbal [R47.01] Anemia, unspecified type [D64.9] Acute on chronic anemia [D64.9] Patient Active Problem List   Diagnosis Date Noted   PAF (paroxysmal atrial fibrillation) (HCC) 09/30/2023   Essential hypertension 09/30/2023   COPD with asthma (HCC) 09/30/2023  Controlled type 2 diabetes mellitus without complication, without long-term current use of insulin  (HCC) 09/30/2023   Hyperlipidemia 09/30/2023   OSA (obstructive sleep apnea) 09/30/2023   Wernicke encephalopathy 09/30/2023   Alcohol abuse 09/30/2023   Depression 09/30/2023   progressive neurologica disorder with pseudobulbar palsy, aphasia, wernicke encephalopathy with continued alcohol use 09/30/2023   Dysphagia 09/30/2023   Acute on chronic anemia 09/30/2023   Fall at home, initial encounter 09/30/2023   Hypokalemia 09/30/2023   Thoracic aortic aneurysm (HCC) 09/30/2023   CAD (coronary artery disease) 09/30/2023   PCP:  [name], MD Pharmacy:  No Pharmacies Listed    Social Drivers of Health (SDOH) Social History: SDOH Screenings   Food Insecurity: Patient Unable To Answer (09/30/2023)  Housing: Patient Unable To Answer (09/30/2023)  Transportation Needs: Patient Unable To Answer (09/30/2023)  Utilities: Patient Unable To Answer (09/30/2023)  Tobacco Use: Low Risk  (09/30/2023)   SDOH  Interventions:     Readmission Risk Interventions     No data to display

## 2023-10-04 NOTE — Plan of Care (Signed)
  Problem: Coping: Goal: Ability to adjust to condition or change in health will improve Outcome: Progressing   Problem: Skin Integrity: Goal: Risk for impaired skin integrity will decrease Outcome: Progressing   Problem: Clinical Measurements: Goal: Will remain free from infection Outcome: Progressing   Problem: Activity: Goal: Risk for activity intolerance will decrease Outcome: Progressing   Problem: Safety: Goal: Ability to remain free from injury will improve Outcome: Progressing

## 2023-10-04 NOTE — Progress Notes (Signed)
 Mobility Specialist Progress Note;   10/04/23 0930  Mobility  Activity Ambulated with assistance in hallway;Transferred from bed to chair  Level of Assistance Minimal assist, patient does 75% or more  Assistive Device Front wheel walker  Distance Ambulated (ft) 60 ft  Activity Response Tolerated well  Mobility Referral Yes  Mobility visit 1 Mobility  Mobility Specialist Start Time (ACUTE ONLY) 0930  Mobility Specialist Stop Time (ACUTE ONLY) 0953  Mobility Specialist Time Calculation (min) (ACUTE ONLY) 23 min   Pt agreeable to mobility. Required MinA throughout session. Pt tachycardic during ambulation, however stated he felt fine. Took 1x standing rest break d/t increased HR. Pt returned back to room safely and left in chair with all needs met.   Lauraine Erm Mobility Specialist Please contact via SecureChat or [employer] [phone]

## 2023-10-04 NOTE — Progress Notes (Signed)
 PROGRESS NOTE    Adam [name]  FMW:[id number] DOB: [birth year] DOA: 09/30/2023 PCP: [name], MD   No chief complaint on file.   Brief Narrative:    Adam [name] is a 72 y.o. male with medical history significant of PAF on eliquis , HTN, HLD, CAD, T2DM, thoracic AA, OSA, alcohol abuse with hx of wernicke encephalopathy and aphasia, pseudobulbar palsy, non verbal state who presented to ED after a fall at home with head injury. He was hypotensive per EMS to 80s on arrival and improved with 500cc bolus to SBP >100. He was drinking. Able to write his left leg gave out and he fell down and hit his head.  Patient with heavy drinking, drinks all day, he drinks scotch or vodka, workup was significant for acute blood loss anemia, admitted for further workup - In ED: started on CIWA protocol, right cheek laceration repaired with 1 suture (nylon)     Assessment & Plan:   Principal Problem:   Acute on chronic anemia Active Problems:   Fall at home, initial encounter   Alcohol abuse   Hypokalemia   Dysphagia   PAF (paroxysmal atrial fibrillation) (HCC)   progressive neurologica disorder with pseudobulbar palsy, aphasia, wernicke encephalopathy with continued alcohol use   Essential hypertension   CAD (coronary artery disease)   Controlled type 2 diabetes mellitus without complication, without long-term current use of insulin  (HCC)   COPD with asthma (HCC)   Thoracic aortic aneurysm (HCC)   Hyperlipidemia   Depression   OSA (obstructive sleep apnea)   Wernicke encephalopathy   Acute blood loss anemia -Baseline hemoglobin at 11, down to 8 on admission, and continued to trend down, so he received 1 unit PRBC  -secondary to blood loss anemia secondary to acute blood loss secondary to hematoma in right thigh area from fall while intoxicated. -CBC closely and transfuse as needed, he received 1 unit PRBC 12/27.  -Continue to hold Eliquis , will not resume on discharge   Fall at  home Unwitnessed fall in setting of alcohol abuse/wernicke encephalopathy  Head CT with no acute findings  This is most likely due to alcohol education, he is high risk for fall, -He has an nylon suture under the right eye, will need to be removed in 7 days from 12/27  Hypokalemia  replaced   Alcohol abuse Very high risk of withdrawal  In progressive Drinks all day scotch and liqour, wife states he has had withdrawal before Continue CIWA protocol    Dysphagia -Seems to be chronic problem, multifactorial mainly the setting of Wernicke's encephalopathy -He is high risk for aspiration -Seen by SLP -Patient is high risk for aspiration, patient aware of this has refused PEG tube multiple times in the past, as well at this point he is not a candidate for it, and I have discussed with wife, she is aware of this, and they do accept these risks of aspiration, so we will continue him on diet.   PAF (paroxysmal atrial fibrillation) (HCC) NSR s/p ablation ILR in situ  He is on Eliquis , he is high risk for fall, and devastating bleed, he is with multiple falls, per wife I cannot even count how many falls , so we will continue to hold, and not to resume on discharge Continue amiodarone  Hold beta blocker with hypotension    progressive neurologica disorder with pseudobulbar palsy, aphasia, wernicke encephalopathy with continued alcohol use -followed by neurology, recently prescribed  Nuedexta  in November of this year  -continues  to drink, worsening dysphagia: palliative care  -consider MRI brain, but has ILR in place -discussed GOC with wife. She can no longer provide care for him at home. SW consult placed -He has DNR order at home, but is telling me is a full code here. Again, GOC need to be discussed further, palliative consult pending    Essential hypertension Blood pressure have been soft, continue to hold blood pressure medications for now Continue IVF  CAD (coronary artery  disease) multiple stent placements CABG x 6 in [place]  in 2011 (LIMA to LAD, RIMA to diagonal, left radial artery to circumflex, SVG to PDA, SVG to PL SA, and questionable SVG  No chest pain Continue medical management.  EKG/troponin pending    Controlled type 2 diabetes mellitus without complication, without long-term current use of insulin  (HCC) A1C of 5.9 in 08/2023 Sensitive SSI and accuchecks QAC/HS  Hold synjardy  XR    COPD with asthma (HCC) No signs/symptoms of flair  Continue home medications    Thoracic aortic aneurysm (HCC) 4.3 on CT today, followed annually by cardiology  Stable    Hyperlipidemia Continue crestor     Depression Continue cymbalta    Hypokalemia -Replaced   Goals of care discussion -Palliative care consulted, as as discussed with the wife, patient is intoxicated and drinking all day long, poor life quality.   DVT prophylaxis: SCD's Code Status: Full Family Communication: D/W wife by phone 12/28, 12/31 Disposition:   Status is: Inpatient    Consultants:  Palliative    Subjective:  No significant events overnight, he denies any complaints.  Objective: Vitals:   10/04/23 0356 10/04/23 0830 10/04/23 1200 10/04/23 1300  BP: 129/64 127/85 (!) 133/47 130/77  Pulse: 77 (!) 104 (!) 111   Resp: 20 18 (!) 24   Temp: 98.6 F (37 C) 99.9 F (37.7 C)  98 F (36.7 C)  TempSrc: Oral Axillary    SpO2: 95% 97% 91%   Weight:      Height:        Intake/Output Summary (Last 24 hours) at 10/04/2023 1415 Last data filed at 10/03/2023 2336 Gross per 24 hour  Intake --  Output 400 ml  Net -400 ml   Filed Weights   09/30/23 0134  Weight: 68 kg    Examination:  Awake, appropriate, gives thumbs up sign, nonverbal, but able to write, frail, chronically ill-appearing Symmetrical Chest wall movement, Good air movement bilaterally, CTAB RRR,No Gallops,Rubs or new Murmurs, No Parasternal Heave +ve B.Sounds, Abd Soft, No tenderness, No  rebound - guarding or rigidity. No Cyanosis, no significant change in bruise and right hip or left inner thigh.    Data Reviewed: I have personally reviewed following labs and imaging studies  CBC: Recent Labs  Lab 09/30/23 1728 10/01/23 0324 10/02/23 0316 10/03/23 0447 10/04/23 0443  WBC 5.5 5.6 6.0 7.1 6.4  HGB 7.9* 8.0* 8.3* 8.3* 8.7*  HCT 23.8* 24.1* 24.8* 24.3* 26.7*  MCV 97.1 96.4 95.8 95.3 98.2  PLT 142* 147* 173 177 187    Basic Metabolic Panel: Recent Labs  Lab 09/30/23 0124 09/30/23 0134 09/30/23 0952 10/01/23 0324 10/01/23 1409 10/02/23 0316 10/03/23 0447 10/04/23 0443  NA 140 139  --  142  --  141 137 141  K 3.2* 3.1*  --  3.3*  --  3.2* 3.1* 3.4*  CL 105 106  --  109  --  108 106 108  CO2 21*  --   --  24  --  23 23 23  GLUCOSE 113* 108*  --  103*  --  95 106* 91  BUN 18 17  --  11  --  9 8 9   CREATININE 1.05 1.00  --  0.89  --  0.85 0.82 0.78  CALCIUM  8.9  --   --  8.7*  --  8.6* 8.6* 9.0  MG  --   --  2.0  --  1.8  --  1.8  --   PHOS  --   --   --   --   --  2.6 2.9  --     GFR: Estimated Creatinine Clearance: 79.1 mL/min (by C-G formula based on SCr of 0.78 mg/dL).  Liver Function Tests: Recent Labs  Lab 09/30/23 0124  AST 49*  ALT 30  ALKPHOS 40  BILITOT 1.0  PROT 5.8*  ALBUMIN 3.1*    CBG: Recent Labs  Lab 10/03/23 1134 10/03/23 1627 10/03/23 2118 10/04/23 0835 10/04/23 1230  GLUCAP 133* 190* 92 128* 194*     No results found for this or any previous visit (from the past 240 hours).       Radiology Studies: No results found.       Scheduled Meds:  sodium chloride    Intravenous Once   amiodarone   100 mg Oral Daily   enoxaparin  (LOVENOX ) injection  40 mg Subcutaneous Q24H   feeding supplement  237 mL Oral BID BM   folic acid   1 mg Oral Daily   insulin  aspart  0-9 Units Subcutaneous TID WC   multivitamin with minerals  1 tablet Oral Daily   pantoprazole   40 mg Oral Daily   Continuous Infusions:  thiamine   (VITAMIN B1) injection 500 mg (10/04/23 0904)     LOS: 4 days       Brayton Lye, MD [facility]   To contact the attending provider between 7A-7P or the covering provider during after hours 7P-7A, please log into the web site [email or web address] and access using universal [place] password for that web site. If you do not have the password, please call the hospital operator.  10/04/2023, 2:15 PM

## 2023-10-04 NOTE — Progress Notes (Signed)
 Palliative Medicine Inpatient Follow Up Note HPI: 72 y.o. male  with past medical history of PAF on eliquis , HTN, HLD, CAD, T2DM, thoracic AA, OSA, alcohol abuse with hx of wernicke encephalopathy and aphasia, pseudobulbar palsy, non verbal state was admitted on 09/30/2023 with initial encounter fall at home on blood thinner with head injury, acute on chronic anemia, hypokalemia, dysphagia, progressive neurological disorder with pseudobulbar palsy, aphasia, Warnicke encephalopathy.   Palliative care has been asked to further assist with goals of care conversation.   Today's Discussion 10/04/2023  *Please note that this is a verbal dictation therefore any spelling or grammatical errors are due to the Dragon Medical One system interpretation.  Chart reviewed inclusive of vital signs, progress notes, laboratory results, and diagnostic images.  I met with Adam [name], his wife, Adam [name], and their best friend, Adam [name] at bedside. We first reviewed patients chronic disease conditions inclusive of pseudobulbar palsy (causing him to life and cry uncontrollably at times), non verbal state, ETOH abuse, & recurrent falls. We discussed patients anemia in the setting of his falls and the need for him to receive blood products. We discussed patient cognition in the setting of his wernicke's encephalopathy directly related to his alcohol abuse.  Adam [name] and his wife agree to his excessive alcohol use. He tends to have difficulty with food consumption and gets remnants of food and alcohol all over him when he intakes them. He takes his pills lying down.   Reviewed patients wishes given his clinical decline. Discussed resuscitative efforts. Adam [name] is able to use a thumbs up, thumbs down, and middle finger sign. He also can write on a piece of paper. Created space and opportunity for patient to explore thoughts feelings and fears regarding current medical situation. He shares that he wants to live and would want  resuscitation. I shared my concerns that we may cause his body greater harm than benefit possibly causing him great trauma. Adam [name] wrote to try anyway. Adam [name] shares that they both made living wills many years ago and each of them decided at that time to be a DNAR/DNI though, Adam [name], at this point wants to pursue full efforts to resuscitate him.   We reviewed that patients primary care had offered information on Palliative and hospice support. It was apparently recommended that it should be considered. I was able to provide information on the differences in services as below:  Palliative care is specialized medical care for people living with a serious illness, such as cancer, heart failure, COPD, Alzheimer's dementia, etc. Patients in palliative care may receive medical care for their symptoms, or palliative care, along with treatment intended to cure their serious illness   Hospice care focuses on the care, comfort, and quality of life of a person with a serious illness who is approaching the end of life. At some point, it may not be possible to cure a serious illness, or a patient may choose not to undergo certain treatments. Hospice is designed for this situation.  Both Adam [name] and his wife became very tearful thereafter. Allowed them time to collect their emotions prior to proceeding with conversation(s).  We discussed the concerns in the setting of Adam [name]'s recurrent falls in the home. Adam [name] shares it is in the setting of him drinking excessively. She shares openly and honestly that she cannot care for Adam [name] any longer as he is in their home. This created a great deal of emotion. We discussed options such as rehab and transition to long term care under medicaid. Adam [name]  became reasonably upset at the tone of the conversation and requested that I leave.  I was able kindly to excuse myself.   Patients wife requests to speak to the patients MSW for further discharge planning conversations.  Provided  [employer] [facility] [employer] booklet and a MOST form.   Questions and concerns addressed/Palliative Support Provided.   Objective Assessment: Vital Signs Vitals:   10/04/23 1200 10/04/23 1300  BP: (!) 133/47 130/77  Pulse: (!) 111   Resp: (!) 24   Temp:  98 F (36.7 C)  SpO2: 91%     Intake/Output Summary (Last 24 hours) at 10/04/2023 1313 Last data filed at 10/03/2023 2336 Gross per 24 hour  Intake --  Output 400 ml  Net -400 ml   Last Weight  Most recent update: 09/30/2023  1:34 AM    Weight  68 kg (150 lb)            Gen:  Chronically ill appearing Caucasian M in NAD HEENT: moist mucous membranes CV: Regular rate and rhythm  PULM:  On RA, breathing is even and not labored ABD: soft/nontender  EXT: No edema  Neuro: Alert and oriented x3 - uses hand gestures and writes on paper to communicate as is not verbal  SUMMARY OF RECOMMENDATIONS   Full Code --> Patient does have a living will and his wife shares that he previously wanted to be a DNAR though now patient has changed his mind to be full resuscitative efforts  Open and honest conversations held in the setting of patients acute on chronic disease  Discussed Palliative versus hospice services  Patients wife unable to provide care in the home --> Appreciate MSW helping to identify alternative care options  Ongoing Palliative support as needed  Time Spent: 19 Billing based on MDM: High ______________________________________________________________________________________ Rosaline Becton [place] Palliative Medicine Team Team Cell Phone: [phone] Please utilize secure chat with additional questions, if there is no response within 30 minutes please call the above phone number  Palliative Medicine Team providers are available by phone from 7am to 7pm daily and can be reached through the team cell phone.  Should this patient require assistance outside of these hours, please call the  patient's attending physician.

## 2023-10-05 DIAGNOSIS — E43 Unspecified severe protein-calorie malnutrition: Secondary | ICD-10-CM | POA: Insufficient documentation

## 2023-10-05 DIAGNOSIS — D649 Anemia, unspecified: Secondary | ICD-10-CM | POA: Diagnosis not present

## 2023-10-05 DIAGNOSIS — Z515 Encounter for palliative care: Secondary | ICD-10-CM | POA: Diagnosis not present

## 2023-10-05 DIAGNOSIS — I480 Paroxysmal atrial fibrillation: Secondary | ICD-10-CM

## 2023-10-05 DIAGNOSIS — J4489 Other specified chronic obstructive pulmonary disease: Secondary | ICD-10-CM

## 2023-10-05 LAB — BASIC METABOLIC PANEL
Anion gap: 7 (ref 5–15)
BUN: 8 mg/dL (ref 8–23)
CO2: 24 mmol/L (ref 22–32)
Calcium: 8.8 mg/dL — ABNORMAL LOW (ref 8.9–10.3)
Chloride: 106 mmol/L (ref 98–111)
Creatinine, Ser: 0.65 mg/dL (ref 0.61–1.24)
GFR, Estimated: >60 mL/min (ref >60)
Glucose, Bld: 110 mg/dL — ABNORMAL HIGH (ref 70–99)
Potassium: 3.5 mmol/L (ref 3.5–5.1)
Sodium: 137 mmol/L (ref 135–145)

## 2023-10-05 LAB — CBC
HCT: 25.7 % — ABNORMAL LOW (ref 39.0–52.0)
Hemoglobin: 8.4 g/dL — ABNORMAL LOW (ref 13.0–17.0)
MCH: 32.6 pg (ref 26.0–34.0)
MCHC: 32.7 g/dL (ref 30.0–36.0)
MCV: 99.6 fL (ref 80.0–100.0)
Platelets: 182 10*3/uL (ref 150–400)
RBC: 2.58 MIL/uL — ABNORMAL LOW (ref 4.22–5.81)
RDW: 17.1 % — ABNORMAL HIGH (ref 11.5–15.5)
WBC: 5.7 10*3/uL (ref 4.0–10.5)
nRBC: 0 % (ref 0.0–0.2)

## 2023-10-05 LAB — GLUCOSE, CAPILLARY
Glucose-Capillary: 166 mg/dL — ABNORMAL HIGH (ref 70–99)
Glucose-Capillary: 147 mg/dL — ABNORMAL HIGH (ref 70–99)
Glucose-Capillary: 104 mg/dL — ABNORMAL HIGH (ref 70–99)

## 2023-10-05 MED ORDER — IPRATROPIUM-ALBUTEROL 0.5-2.5 (3) MG/3ML IN SOLN: 3.0000 mL | Freq: Four times a day (QID) | RESPIRATORY_TRACT | Status: DC | PRN

## 2023-10-05 NOTE — Progress Notes (Signed)
   Palliative Medicine Inpatient Follow Up Note HPI: 73 y.o. male  with past medical history of PAF on eliquis , HTN, HLD, CAD, T2DM, thoracic AA, OSA, alcohol abuse with hx of wernicke encephalopathy and aphasia, pseudobulbar palsy, non verbal state was admitted on 09/30/2023 with initial encounter fall at home on blood thinner with head injury, acute on chronic anemia, hypokalemia, dysphagia, progressive neurological disorder with pseudobulbar palsy, aphasia, Warnicke encephalopathy.   Palliative care has been asked to further assist with goals of care conversation.   Today's Discussion 10/05/2023  *Please note that this is a verbal dictation therefore any spelling or grammatical errors are due to the Dragon Medical One system interpretation.  Chart reviewed inclusive of vital signs, progress notes, laboratory results, and diagnostic images.  Per patients RN, Devin there are no acute concerns this morning.   I met at bedside with Adam [name] this morning. He was noted to be resting comfortably in NAD.   Plan at the present time is for long term placement. Patients spouse is working with [employer].   Questions and concerns addressed/Palliative Support Provided.   Objective Assessment: Vital Signs Vitals:   10/05/23 0800 10/05/23 0824  BP: (!) 101/59   Pulse: 76   Resp: 16   Temp: 98.6 F (37 C)   SpO2:  98%    Intake/Output Summary (Last 24 hours) at 10/05/2023 9156 Last data filed at 10/05/2023 0200 Gross per 24 hour  Intake 55 ml  Output 950 ml  Net -895 ml   Last Weight  Most recent update: 09/30/2023  1:34 AM    Weight  68 kg (150 lb)            Gen:  Chronically ill appearing Caucasian M in NAD HEENT: moist mucous membranes CV: Regular rate and rhythm  PULM:  On RA, breathing is even and not labored ABD: soft/nontender  EXT: No edema  Neuro: Alert and oriented x3 - uses hand gestures and writes on paper to communicate as is not verbal  SUMMARY OF RECOMMENDATIONS    Full Code --> Patient does have a living will and his wife shares that he previously wanted to be a DNAR though now patient has changed his mind to be full resuscitative efforts  Have requested a copy of living will to scan into [employer]  Plan for Palliative support on discharge  Patients wife unable to provide care in the home --> Appreciate MSW helping to identify long term care options  Palliative care will remain peripherally involved here  Billing based on MDM:Low ______________________________________________________________________________________ Rosaline Becton [place] Palliative Medicine Team Team Cell Phone: [phone] Please utilize secure chat with additional questions, if there is no response within 30 minutes please call the above phone number  Palliative Medicine Team providers are available by phone from 7am to 7pm daily and can be reached through the team cell phone.  Should this patient require assistance outside of these hours, please call the patient's attending physician.

## 2023-10-05 NOTE — Plan of Care (Signed)
  Problem: Coping: Goal: Ability to adjust to condition or change in health will improve Outcome: Progressing   Problem: Skin Integrity: Goal: Risk for impaired skin integrity will decrease Outcome: Progressing   Problem: Health Behavior/Discharge Planning: Goal: Ability to manage health-related needs will improve Outcome: Progressing   Problem: Clinical Measurements: Goal: Ability to maintain clinical measurements within normal limits will improve Outcome: Progressing   Problem: Pain Management: Goal: General experience of comfort will improve Outcome: Progressing   Problem: Safety: Goal: Ability to remain free from injury will improve Outcome: Progressing   Problem: Skin Integrity: Goal: Risk for impaired skin integrity will decrease Outcome: Progressing

## 2023-10-05 NOTE — Progress Notes (Signed)
 Mobility Specialist Progress Note;   10/05/23 0915  Mobility  Activity Transferred from bed to chair  Level of Assistance Minimal assist, patient does 75% or more  Assistive Device Front wheel walker  Distance Ambulated (ft) 3 ft  Activity Response Tolerated well  Mobility Referral Yes  Mobility visit 1 Mobility  Mobility Specialist Start Time (ACUTE ONLY) 0915  Mobility Specialist Stop Time (ACUTE ONLY) 0930  Mobility Specialist Time Calculation (min) (ACUTE ONLY) 15 min   Pt agreeable to mobility. Required MinA for bed mobility and during transfer from bed to chair. C/o bruise on R hip/buttock area. HR did increase to 130bpm upon standing at side of bed, recovered during session. Pt left in chair with all needs met. Alarm on.   Lauraine Erm Mobility Specialist Please contact via SecureChat or [employer] [phone]

## 2023-10-05 NOTE — Progress Notes (Addendum)
 PROGRESS NOTE        PATIENT DETAILS Name: Adam [name] Age: 73 y.o. Sex: male Date of Birth: [birth year] Admit Date: 09/30/2023 Admitting Physician Isaiah Geralds, MD ERE:Almryzuuz, [name], MD  Brief Summary: Patient is a 72 y.o.  male with history of EtOH use, Warnicke's encephalopathy, pseudobulbar palsy, chronic aphasia, HTN, CAD s/p CABG, PAF, DM-2, COPD-who presented to the hospital following a mechanical fall-he was found to have right thigh hematoma with acute blood loss anemia-subsequently admitted to the hospitalist service for further evaluation and treatment.  Significant events: 12/27>> admit to [facility]  Significant studies: 12/27>> CXR: No PNA 12/27>> pelvic x-ray: No traumatic injury 12/27>> CT head: No acute intracranial abnormality. 12/27>> CT C-spine: No fracture 12/27>> CT chest/abdomen/pelvis: Soft tissue hematoma right hip, no acute injuries. 12/27>> x-ray left femur: No fracture. 12/27>> x-ray left knee: No fracture.  Significant microbiology data: None  Procedures: None  Consults: Palliative care  Subjective: Lying comfortably in bed-denies any chest pain or shortness of breath.  Objective: Vitals: Blood pressure (!) 101/59, pulse 72, temperature 98.6 F (37 C), temperature source Oral, resp. rate 16, height 5' 10 (1.778 m), weight 68 kg, SpO2 98%.   Exam: Gen Exam:Alert awake-not in any distress HEENT:atraumatic, normocephalic Chest: B/L clear to auscultation anteriorly CVS:S1S2 regular Abdomen:soft non tender, non distended Extremities:no edema Neurology: Non focal Skin: no rash  Pertinent Labs/Radiology:    Latest Ref Rng & Units 10/05/2023    5:14 AM 10/04/2023    4:43 AM 10/03/2023    4:47 AM  CBC  WBC 4.0 - 10.5 K/uL 5.7  6.4  7.1   Hemoglobin 13.0 - 17.0 g/dL 8.4  8.7  8.3   Hematocrit 39.0 - 52.0 % 25.7  26.7  24.3   Platelets 150 - 400 K/uL 182  187  177     Lab Results  Component Value Date    NA 137 10/05/2023   K 3.5 10/05/2023   CL 106 10/05/2023   CO2 24 10/05/2023      Assessment/Plan: Acute blood loss anemia secondary to right thigh hematoma Managed with supportive care-did require 1 unit of PRBC on 12/27-none since then. Hb remains stable Follow CBC periodically.  Mechanical fall at home in the setting of alcohol use History of frequent falls-due to pseudobulbar palsy/Warnicke's encephalopathy Extensive imaging studies negative for fracture He has an nylon suture under the right eye, will need to be removed in 7 days from 12/27  Plans are for SNF  History of pseudobulbar palsy/Wernicke's encephalopathy/motor aphasia Aphasic at baseline-communicates with signs/writing. Follows with neurology in the outpatient setting  Dysphagia Chronic issue-related to pseudobulbar palsy/Wernicke's encephalopathy Has refused PEG tube in the past After discussion with patient/family-diet resumed with known aspiration risk. SLP following  EtOH use Not sure if he has any intention of quitting Should be almost out of window for any withdrawal symptoms at this point  PAF Sinus rhythm Amiodarone  Beta-blocker held due to hypotension. Reviewed prior notes-numerous falls-ongoing alcohol use-agree with Dr. Shellie a good long-term candidate for anticoagulation.  CAD s/p CABG 2011 No anginal symptoms  HLD Statin  HTN BP stable All antihypertensives on hold as BP soft.  DM-2 (A1c 5.13 August 2023) CBG stable on SSI Resume oral hypoglycemics on discharge  Recent Labs    10/04/23 1550 10/04/23 2133 10/05/23 0747  GLUCAP 151* 111* 166*  Cholelithiasis Incidental finding on CT imaging Outpatient follow-up with PCP/general surgery  4.3 cm ascending aortic aneurysm Incidental finding on CT imaging-annual outpatient CTA/MRA recommended  Mood disorder Cymbalta   COPD/asthma Stable As needed bronchodilators  Palliative care Previously DNR-now full  code Very poor underlying prognosis given his underlying neurological issues-ongoing alcohol use Palliative care following.  Nutrition Status: Nutrition Problem: Severe Malnutrition Etiology: chronic illness Signs/Symptoms: severe fat depletion, severe muscle depletion Interventions: Ensure Enlive (each supplement provides 350kcal and 20 grams of protein), MVI, Magic cup    BMI: Estimated body mass index is 21.52 kg/m as calculated from the following:   Height as of this encounter: 5' 10 (1.778 m).   Weight as of this encounter: 68 kg.   Code status:   Code Status: Full Code   DVT Prophylaxis: enoxaparin  (LOVENOX ) injection 40 mg Start: 10/03/23 2200 SCDs Start: 09/30/23 1135 Place TED hose Start: 09/30/23 1117   Family Communication: None at bedside   Disposition Plan: Status is: Inpatient Remains inpatient appropriate because: Severity of illness   Planned Discharge Destination:Skilled nursing facility   Diet: Diet Order             Diet full liquid Room service appropriate? No; Fluid consistency: Thin  Diet effective now                     Antimicrobial agents: Anti-infectives (From admission, onward)    None        MEDICATIONS: Scheduled Meds:  sodium chloride    Intravenous Once   amiodarone   100 mg Oral Daily   docusate sodium   100 mg Oral BID   enoxaparin  (LOVENOX ) injection  40 mg Subcutaneous Q24H   feeding supplement  237 mL Oral BID BM   folic acid   1 mg Oral Daily   insulin  aspart  0-9 Units Subcutaneous TID WC   multivitamin with minerals  1 tablet Oral Daily   pantoprazole   40 mg Oral Daily   Continuous Infusions:  thiamine  (VITAMIN B1) injection 500 mg (10/05/23 0816)   PRN Meds:.acetaminophen  **OR** acetaminophen , HYDROcodone -acetaminophen , morphine  injection, ondansetron  **OR** ondansetron  (ZOFRAN ) IV, polyethylene glycol   I have personally reviewed following labs and imaging studies  LABORATORY DATA: CBC: Recent Labs   Lab 10/01/23 0324 10/02/23 0316 10/03/23 0447 10/04/23 0443 10/05/23 0514  WBC 5.6 6.0 7.1 6.4 5.7  HGB 8.0* 8.3* 8.3* 8.7* 8.4*  HCT 24.1* 24.8* 24.3* 26.7* 25.7*  MCV 96.4 95.8 95.3 98.2 99.6  PLT 147* 173 177 187 182    Basic Metabolic Panel: Recent Labs  Lab 09/30/23 0952 10/01/23 0324 10/01/23 1409 10/02/23 0316 10/03/23 0447 10/04/23 0443 10/05/23 0514  NA  --  142  --  141 137 141 137  K  --  3.3*  --  3.2* 3.1* 3.4* 3.5  CL  --  109  --  108 106 108 106  CO2  --  24  --  23 23 23 24   GLUCOSE  --  103*  --  95 106* 91 110*  BUN  --  11  --  9 8 9 8   CREATININE  --  0.89  --  0.85 0.82 0.78 0.65  CALCIUM   --  8.7*  --  8.6* 8.6* 9.0 8.8*  MG 2.0  --  1.8  --  1.8  --   --   PHOS  --   --   --  2.6 2.9  --   --     GFR: Estimated  Creatinine Clearance: 79.1 mL/min (by C-G formula based on SCr of 0.65 mg/dL).  Liver Function Tests: Recent Labs  Lab 09/30/23 0124  AST 49*  ALT 30  ALKPHOS 40  BILITOT 1.0  PROT 5.8*  ALBUMIN 3.1*   No results for input(s): LIPASE, AMYLASE in the last 168 hours. No results for input(s): AMMONIA in the last 168 hours.  Coagulation Profile: Recent Labs  Lab 09/30/23 0200  INR 1.3*    Cardiac Enzymes: Recent Labs  Lab 09/30/23 0200  CKTOTAL 288    BNP (last 3 results) No results for input(s): PROBNP in the last 8760 hours.  Lipid Profile: No results for input(s): CHOL, HDL, LDLCALC, TRIG, CHOLHDL, LDLDIRECT in the last 72 hours.  Thyroid  Function Tests: No results for input(s): TSH, T4TOTAL, FREET4, T3FREE, THYROIDAB in the last 72 hours.  Anemia Panel: No results for input(s): VITAMINB12, FOLATE, FERRITIN, TIBC, IRON, RETICCTPCT in the last 72 hours.  Urine analysis: No results found for: COLORURINE, APPEARANCEUR, LABSPEC, PHURINE, GLUCOSEU, HGBUR, BILIRUBINUR, KETONESUR, PROTEINUR, UROBILINOGEN, NITRITE, LEUKOCYTESUR  Sepsis  Labs: Lactic Acid, Venous    Component Value Date/Time   LATICACIDVEN 0.9 09/30/2023 0549    MICROBIOLOGY: No results found for this or any previous visit (from the past 240 hours).  RADIOLOGY STUDIES/RESULTS: No results found.   LOS: 5 days   Donalda Applebaum, MD  [facility]    To contact the attending provider between 7A-7P or the covering provider during after hours 7P-7A, please log into the web site [email or web address] and access using universal [place] password for that web site. If you do not have the password, please call the hospital operator.  10/05/2023, 10:52 AM

## 2023-10-06 ENCOUNTER — Ambulatory Visit: Payer: Medicare Other

## 2023-10-06 DIAGNOSIS — I480 Paroxysmal atrial fibrillation: Secondary | ICD-10-CM | POA: Diagnosis not present

## 2023-10-06 DIAGNOSIS — J4489 Other specified chronic obstructive pulmonary disease: Secondary | ICD-10-CM | POA: Diagnosis not present

## 2023-10-06 DIAGNOSIS — D649 Anemia, unspecified: Secondary | ICD-10-CM | POA: Diagnosis not present

## 2023-10-06 DIAGNOSIS — E43 Unspecified severe protein-calorie malnutrition: Secondary | ICD-10-CM | POA: Diagnosis not present

## 2023-10-06 LAB — GLUCOSE, CAPILLARY
Glucose-Capillary: 136 mg/dL — ABNORMAL HIGH (ref 70–99)
Glucose-Capillary: 165 mg/dL — ABNORMAL HIGH (ref 70–99)
Glucose-Capillary: 201 mg/dL — ABNORMAL HIGH (ref 70–99)
Glucose-Capillary: 137 mg/dL — ABNORMAL HIGH (ref 70–99)

## 2023-10-06 NOTE — Progress Notes (Signed)
 Physical Therapy Treatment Patient Details Name: Adam [name] MRN: [id number] DOB: 1950/10/12 Today's Date: 10/06/2023   History of Present Illness 73 y.o. male presents to [facility] on 09/30/2023 after a fall at home with head injury. Pt noted to be hypotensive, was consuming alcohol. PMH includes PAF on eliquis , HTN, HLD, CAD, DMII, thoracic AA, alcohol abuse, wernicke encephalopathy.    PT Comments  Pt received in supine and agreeable to session. Pt requires mod A to sit to EOB due to weakness. Pt is able to stand and ambulate with up to min A. Pt able to tolerate increased gait distance this session, but requires cues for improved safety and technique. Pt continues to benefit from PT services to progress toward functional mobility goals.     If plan is discharge home, recommend the following: A little help with walking and/or transfers;A little help with bathing/dressing/bathroom;Assistance with cooking/housework;Assist for transportation;Help with stairs or ramp for entrance   Can travel by private vehicle        Equipment Recommendations  BSC/3in1    Recommendations for Other Services       Precautions / Restrictions Precautions Precautions: Fall Restrictions Weight Bearing Restrictions Per Provider Order: No     Mobility  Bed Mobility Overal bed mobility: Needs Assistance Bed Mobility: Supine to Sit, Sit to Supine     Supine to sit: HOB elevated, Mod assist, Used rails Sit to supine: Contact guard assist   General bed mobility comments: Mod A for trunk elevation to sit to EOB    Transfers Overall transfer level: Needs assistance Equipment used: Rolling walker (2 wheels) Transfers: Sit to/from Stand Sit to Stand: Contact guard assist, Min assist           General transfer comment: STS from EOB with min A for anterior weight shift and increased time for power up. CGA from chair with UE on chair arm    Ambulation/Gait Ambulation/Gait assistance: Contact  guard assist Gait Distance (Feet): 150 Feet Assistive device: Rolling walker (2 wheels) Gait Pattern/deviations: Step-through pattern, Trunk flexed, Decreased stride length Gait velocity: reduced     General Gait Details: Step-through pattern with cues for upright posture, upward gaze, and RW proximity. Low foot clearance and some instability requiring CGA for safety, but no LOB     Balance Overall balance assessment: Needs assistance Sitting-balance support: No upper extremity supported, Feet supported Sitting balance-Leahy Scale: Good     Standing balance support: Bilateral upper extremity supported, Reliant on assistive device for balance, During functional activity Standing balance-Leahy Scale: Poor Standing balance comment: with RW support                            Cognition Arousal: Alert Behavior During Therapy: WFL for tasks assessed/performed Overall Cognitive Status: Difficult to assess                                 General Comments: Pt follows all cues and commands        Exercises      General Comments        Pertinent Vitals/Pain Pain Assessment Pain Assessment: 0-10 Pain Score: 7  Pain Location: R hip Pain Descriptors / Indicators: Aching Pain Intervention(s): Monitored during session, Repositioned     PT Goals (current goals can now be found in the care plan section) Acute Rehab PT Goals Patient Stated Goal: to improve  activity tolerance, restore independence in mobility PT Goal Formulation: With patient Time For Goal Achievement: 10/15/23 Progress towards PT goals: Progressing toward goals    Frequency    Min 1X/week       AM-PAC PT 6 Clicks Mobility   Outcome Measure  Help needed turning from your back to your side while in a flat bed without using bedrails?: A Little Help needed moving from lying on your back to sitting on the side of a flat bed without using bedrails?: A Lot Help needed moving to and  from a bed to a chair (including a wheelchair)?: A Little Help needed standing up from a chair using your arms (e.g., wheelchair or bedside chair)?: A Little Help needed to walk in hospital room?: A Little Help needed climbing 3-5 steps with a railing? : A Lot 6 Click Score: 16    End of Session Equipment Utilized During Treatment: Gait belt Activity Tolerance: Patient tolerated treatment well Patient left: in bed;with call bell/phone within reach;with bed alarm set Nurse Communication: Mobility status PT Visit Diagnosis: Other abnormalities of gait and mobility (R26.89);Muscle weakness (generalized) (M62.81)     Time: 1328-1400 PT Time Calculation (min) (ACUTE ONLY): 32 min  Charges:    $Gait Training: 8-22 mins $Therapeutic Activity: 8-22 mins PT General Charges $$ ACUTE PT VISIT: 1 Visit                     Darryle George, PTA Acute Rehabilitation Services Secure Chat Preferred  [phone]    Darryle George 10/06/2023, 2:10 PM

## 2023-10-06 NOTE — TOC CAGE-AID Note (Signed)
 Transition of Care [facility]) - CAGE-AID Screening   Patient Details  Name: Adam [name] MRN: [id number] Date of Birth: 1950-12-07  Transition of Care [facility]) CM/SW Contact:    Inocente GORMAN Kindle, LCSW Phone Number: 10/06/2023, 11:12 AM   Clinical Narrative: Patient and spouse reported daily alcohol use. Resources provided.   CAGE-AID Screening:    Have You Ever Felt You Ought to Cut Down on Your Drinking or Drug Use?: No Have People Annoyed You By Critizing Your Drinking Or Drug Use?: No Have You Felt Bad Or Guilty About Your Drinking Or Drug Use?: No Have You Ever Had a Drink or Used Drugs First Thing In The Morning to Steady Your Nerves or to Get Rid of a Hangover?: Yes CAGE-AID Score: 1  Substance Abuse Education Offered: Yes  Substance abuse interventions: Transport Planner

## 2023-10-06 NOTE — Progress Notes (Signed)
 Mobility Specialist Progress Note;   10/06/23 1010  Mobility  Activity Transferred from bed to chair  Level of Assistance Minimal assist, patient does 75% or more  Assistive Device Front wheel walker  Distance Ambulated (ft) 3 ft  Activity Response Tolerated well  Mobility Referral Yes  Mobility visit 1 Mobility  Mobility Specialist Start Time (ACUTE ONLY) 1010  Mobility Specialist Stop Time (ACUTE ONLY) 1026  Mobility Specialist Time Calculation (min) (ACUTE ONLY) 16 min   Pt agreeable to mobility. Required MinA for bed mobility, STS, and during transfer from bed to chair. VSS throughout. Verbal cues required for RW proximity and transfer safety. Pt left in chair with all needs met, alarm on.   Lauraine Erm Mobility Specialist Please contact via SecureChat or [employer] [phone]

## 2023-10-06 NOTE — Progress Notes (Signed)
 Nutrition Follow-up  DOCUMENTATION CODES:   Severe malnutrition in context of chronic illness  INTERVENTION:  Ice cream and pudding at each meal.  Continue with current diet. Ensure Plus High Protein po BID, each supplement provides 350 kcal and 20 grams of protein.   NUTRITION DIAGNOSIS:   Severe Malnutrition related to chronic illness as evidenced by severe fat depletion, severe muscle depletion.  Ongoing with interventions in place.   GOAL:   Patient will meet greater than or equal to 90% of their needs    MONITOR:   PO intake, Supplement acceptance  REASON FOR ASSESSMENT:   Malnutrition Screening Tool    ASSESSMENT:   73 y.o. M, Presented from home to ED via EMS after fall at home with head injury. Admitted with acute chronic anemia.  PMH; PAF on eliquis , HTN, HLD, CAD, T2DM, thoracic AA, OSA, alcohol abuse with hx of wernicke encephalopathy and aphasia, pseudobulbar palsy, non verbal.  Patient non verbal although communicates through hand jesters and writing. SLP reported that pt eats pudding and ice cream at home, this was implemented on last review.   Patient to discharge soon.  When asked how his appetite was he gave me the thumbs up.  Asked if he was receiving pudding and ice cream shook had yes. Aske if enjoying thumbs up.   NUTRITION - FOCUSED PHYSICAL EXAM:  Flowsheet Row Most Recent Value  Orbital Region Severe depletion  Upper Arm Region Severe depletion  Thoracic and Lumbar Region Severe depletion  Buccal Region Moderate depletion  Temple Region Severe depletion  Clavicle Bone Region Severe depletion  Clavicle and Acromion Bone Region Severe depletion  Scapular Bone Region Moderate depletion  Dorsal Hand Moderate depletion  Patellar Region Severe depletion  Anterior Thigh Region Severe depletion  Posterior Calf Region Severe depletion  Edema (RD Assessment) Mild  Hair Reviewed  Eyes Reviewed  Mouth Reviewed  Skin Reviewed  Nails Reviewed        Diet Order:   Diet Order             Diet full liquid Room service appropriate? No; Fluid consistency: Thin  Diet effective now                   EDUCATION NEEDS:   Education needs have been addressed  Skin:  Skin Assessment: Reviewed RN Assessment  Last BM:  12/27  Height:   Ht Readings from Last 1 Encounters:  09/30/23 5' 10 (1.778 m)    Weight:   Wt Readings from Last 1 Encounters:  09/30/23 68 kg    Ideal Body Weight:     BMI:  Body mass index is 21.52 kg/m.  Estimated Nutritional Needs:   Kcal:  2050-2400 kcal  Protein:  90-105 g  Fluid:  29ml/kcal    Jenna Pew RDN, LDN Clinical Dietitian   If unable to reach, please contact RD Inpatient secure chat group between 8 am-4 pm daily

## 2023-10-06 NOTE — Progress Notes (Addendum)
 PROGRESS NOTE        PATIENT DETAILS Name: Adam [name] Age: 73 y.o. Sex: male Date of Birth: 1951/08/05 Admit Date: 09/30/2023 Admitting Physician Isaiah Geralds, MD ERE:Almryzuuz, [name], MD  Brief Summary: Patient is a 73 y.o.  male with history of EtOH use, Warnicke's encephalopathy, pseudobulbar palsy, chronic aphasia, HTN, CAD s/p CABG, PAF, DM-2, COPD-who presented to the hospital following a mechanical fall-he was found to have right thigh hematoma with acute blood loss anemia-subsequently admitted to the hospitalist service for further evaluation and treatment.  Significant events: 12/27>> admit to [facility]  Significant studies: 12/27>> CXR: No PNA 12/27>> pelvic x-ray: No traumatic injury 12/27>> CT head: No acute intracranial abnormality. 12/27>> CT C-spine: No fracture 12/27>> CT chest/abdomen/pelvis: Soft tissue hematoma right hip, no acute injuries. 12/27>> x-ray left femur: No fracture. 12/27>> x-ray left knee: No fracture.  Significant microbiology data: None  Procedures: None  Consults: Palliative care  Subjective: No complaints/issues overnight-appears unchanged-gives me a thumbs up.  Remains aphasic which is chronic.  Objective: Vitals: Blood pressure 113/62, pulse 92, temperature 98.6 F (37 C), temperature source Oral, resp. rate 16, height 5' 10 (1.778 m), weight 68 kg, SpO2 97%.   Exam: Gen Exam:Alert awake-not in any distress HEENT:atraumatic, normocephalic Chest: B/L clear to auscultation anteriorly CVS:S1S2 regular Abdomen:soft non tender, non distended Extremities:no edema Neurology: Non focal Skin: no rash  Pertinent Labs/Radiology:    Latest Ref Rng & Units 10/05/2023    5:14 AM 10/04/2023    4:43 AM 10/03/2023    4:47 AM  CBC  WBC 4.0 - 10.5 K/uL 5.7  6.4  7.1   Hemoglobin 13.0 - 17.0 g/dL 8.4  8.7  8.3   Hematocrit 39.0 - 52.0 % 25.7  26.7  24.3   Platelets 150 - 400 K/uL 182  187  177     Lab  Results  Component Value Date   NA 137 10/05/2023   K 3.5 10/05/2023   CL 106 10/05/2023   CO2 24 10/05/2023      Assessment/Plan: Acute blood loss anemia secondary to right thigh hematoma Managed with supportive care-did require 1 unit of PRBC on 12/27-none since then. Hb remains stable Follow CBC periodically.  Mechanical fall at home in the setting of alcohol use History of frequent falls-due to pseudobulbar palsy/Warnicke's encephalopathy Extensive imaging studies negative for fracture Plans are for SNF-spoke with spouse 1/2-she is unable to care of this patient any longer-and is requesting SNF.  Per spouse-all he does is drink, gets violent with her if he does not get alcohol and has multiple falls daily. Has a nylon suture under the right eye, will need to be removed in 7 days from 12/27  Also has staples in the occipital area placed on 12/24-these will need to be removed along with above.  History of pseudobulbar palsy/Wernicke's encephalopathy/motor aphasia Aphasic at baseline-communicates with signs/writing. Follows with neurology in the outpatient setting  Dysphagia Chronic issue-related to pseudobulbar palsy/Wernicke's encephalopathy Has refused PEG tube in the past After discussion with patient/family-diet resumed with known aspiration risk. SLP following  EtOH use Not sure if he has any intention of quitting Should be out of window for any withdrawal symptoms at this point  PAF Sinus rhythm Amiodarone  Beta-blocker held due to hypotension. Reviewed prior notes-numerous falls-ongoing alcohol use-agree with Dr. Shellie a good long-term candidate for  anticoagulation.  CAD s/p CABG 2011 No anginal symptoms  HLD Statin  HTN BP stable All antihypertensives on hold as BP soft.  DM-2 (A1c 5.13 August 2023) CBG stable on SSI Resume oral hypoglycemics on discharge  Recent Labs    10/05/23 1652 10/06/23 0747 10/06/23 1249  GLUCAP 104* 136* 165*      Cholelithiasis Incidental finding on CT imaging Outpatient follow-up with PCP/general surgery  4.3 cm ascending aortic aneurysm Incidental finding on CT imaging-annual outpatient CTA/MRA recommended  Mood disorder Cymbalta   COPD/asthma Stable As needed bronchodilators  Palliative care Previously DNR-now full code Very poor underlying prognosis given his underlying neurological issues-ongoing alcohol use Palliative care following.  Nutrition Status: Nutrition Problem: Severe Malnutrition Etiology: chronic illness Signs/Symptoms: severe fat depletion, severe muscle depletion Interventions: Ensure Enlive (each supplement provides 350kcal and 20 grams of protein), MVI, Magic cup    BMI: Estimated body mass index is 21.52 kg/m as calculated from the following:   Height as of this encounter: 5' 10 (1.778 m).   Weight as of this encounter: 68 kg.   Code status:   Code Status: Full Code   DVT Prophylaxis: enoxaparin  (LOVENOX ) injection 40 mg Start: 10/03/23 2200 SCDs Start: 09/30/23 1135 Place TED hose Start: 09/30/23 1117   Family Communication: Spouse-[name]-[phone]-updated 1/2   Disposition Plan: Status is: Inpatient Remains inpatient appropriate because: Severity of illness   Planned Discharge Destination:Skilled nursing facility-unsafe discharge home-as spouse is not able to take care of him.   Diet: Diet Order             Diet full liquid Room service appropriate? No; Fluid consistency: Thin  Diet effective now                     Antimicrobial agents: Anti-infectives (From admission, onward)    None        MEDICATIONS: Scheduled Meds:  sodium chloride    Intravenous Once   amiodarone   100 mg Oral Daily   docusate sodium   100 mg Oral BID   enoxaparin  (LOVENOX ) injection  40 mg Subcutaneous Q24H   feeding supplement  237 mL Oral BID BM   folic acid   1 mg Oral Daily   insulin  aspart  0-9 Units Subcutaneous TID WC   multivitamin  with minerals  1 tablet Oral Daily   pantoprazole   40 mg Oral Daily   Continuous Infusions:   PRN Meds:.acetaminophen  **OR** acetaminophen , HYDROcodone -acetaminophen , ipratropium-albuterol , morphine  injection, ondansetron  **OR** ondansetron  (ZOFRAN ) IV, polyethylene glycol   I have personally reviewed following labs and imaging studies  LABORATORY DATA: CBC: Recent Labs  Lab 10/01/23 0324 10/02/23 0316 10/03/23 0447 10/04/23 0443 10/05/23 0514  WBC 5.6 6.0 7.1 6.4 5.7  HGB 8.0* 8.3* 8.3* 8.7* 8.4*  HCT 24.1* 24.8* 24.3* 26.7* 25.7*  MCV 96.4 95.8 95.3 98.2 99.6  PLT 147* 173 177 187 182    Basic Metabolic Panel: Recent Labs  Lab 09/30/23 0952 10/01/23 0324 10/01/23 1409 10/02/23 0316 10/03/23 0447 10/04/23 0443 10/05/23 0514  NA  --  142  --  141 137 141 137  K  --  3.3*  --  3.2* 3.1* 3.4* 3.5  CL  --  109  --  108 106 108 106  CO2  --  24  --  23 23 23 24   GLUCOSE  --  103*  --  95 106* 91 110*  BUN  --  11  --  9 8 9 8   CREATININE  --  0.89  --  0.85 0.82 0.78 0.65  CALCIUM   --  8.7*  --  8.6* 8.6* 9.0 8.8*  MG 2.0  --  1.8  --  1.8  --   --   PHOS  --   --   --  2.6 2.9  --   --     GFR: Estimated Creatinine Clearance: 79.1 mL/min (by C-G formula based on SCr of 0.65 mg/dL).  Liver Function Tests: Recent Labs  Lab 09/30/23 0124  AST 49*  ALT 30  ALKPHOS 40  BILITOT 1.0  PROT 5.8*  ALBUMIN 3.1*   No results for input(s): LIPASE, AMYLASE in the last 168 hours. No results for input(s): AMMONIA in the last 168 hours.  Coagulation Profile: Recent Labs  Lab 09/30/23 0200  INR 1.3*    Cardiac Enzymes: Recent Labs  Lab 09/30/23 0200  CKTOTAL 288    BNP (last 3 results) No results for input(s): PROBNP in the last 8760 hours.  Lipid Profile: No results for input(s): CHOL, HDL, LDLCALC, TRIG, CHOLHDL, LDLDIRECT in the last 72 hours.  Thyroid  Function Tests: No results for input(s): TSH, T4TOTAL, FREET4,  T3FREE, THYROIDAB in the last 72 hours.  Anemia Panel: No results for input(s): VITAMINB12, FOLATE, FERRITIN, TIBC, IRON, RETICCTPCT in the last 72 hours.  Urine analysis: No results found for: COLORURINE, APPEARANCEUR, LABSPEC, PHURINE, GLUCOSEU, HGBUR, BILIRUBINUR, KETONESUR, PROTEINUR, UROBILINOGEN, NITRITE, LEUKOCYTESUR  Sepsis Labs: Lactic Acid, Venous    Component Value Date/Time   LATICACIDVEN 0.9 09/30/2023 0549    MICROBIOLOGY: No results found for this or any previous visit (from the past 240 hours).  RADIOLOGY STUDIES/RESULTS: No results found.   LOS: 6 days   Donalda Applebaum, MD  [facility]    To contact the attending provider between 7A-7P or the covering provider during after hours 7P-7A, please log into the web site [email or web address] and access using universal [place] password for that web site. If you do not have the password, please call the hospital operator.  10/06/2023, 2:29 PM

## 2023-10-06 NOTE — Progress Notes (Signed)
 Speech Language Pathology Treatment: Dysphagia  Patient Details Name: Adam [name] MRN: [id number] DOB: [birth year] Today's Date: 10/06/2023 Time: 9074-9053 SLP Time Calculation (min) (ACUTE ONLY): 21 min  Assessment / Plan / Recommendation Clinical Impression  Pt seen with po's who eats/drinks with known aspiration and accepts risk. He has refused feeding tube in the past and uses gestures today confirms that he does not want alternate means of nutrition. Demonstrated signs aspiration with immediate cough on cup sip grape juice then at the end of session with delayed cough. Discussed risk and pt give thumbs up that he comprehends. He was able to transit vanilla pudding with decreased lingual movement with some anterior spill and residue although improved from session 12/31 where he was observed with applesauce. He wants to remain on full liquids and RD (per discussion Tues) ensures he has ice cream and pudding on trays which he states he can eat easier. He also wrote that he gets soups as well. Discussed situation with pt's RN and precautions. Also placed a sign above pt's bed stating that he eats with aspiration risks, coughing is expected and to ensure his mouth is clear at end of meals. At present there is not much more skilled therapy that ST has to offer and will sign off at this time.    HPI HPI: Adam THORSTENSON is a 73 y.o. male with medical history significant of PAF on eliquis , HTN, HLD, CAD, T2DM, thoracic AA, OSA, alcohol abuse with hx of wernicke encephalopathy and aphasia, pseudobulbar palsy, non verbal state who presented to ED after a fall at home with head injury. He was hypotensive per EMS to 80s on arrival and improved with 500cc bolus to SBP >100. He was drinking. Able to write his left leg gave out and he fell down and hit his head. Lost 100 pounds over last 1.5 years   MBSS attempted last month, but aborted as  he could not complete  Many discussions about G tube, but he has been  adamant that he does not want this.  There is a much more elaborate history that is not available until charts merge.      SLP Plan  Discharge SLP treatment due to (comment);All goals met      Recommendations for follow up therapy are one component of a multi-disciplinary discharge planning process, led by the attending physician.  Recommendations may be updated based on patient status, additional functional criteria and insurance authorization.    Recommendations  Diet recommendations: Thin liquid;Other(comment) (full liquids) Liquids provided via: Cup Medication Administration: Whole meds with puree Supervision: Patient able to self feed Compensations: Slow rate;Small sips/bites;Lingual sweep for clearance of pocketing;Monitor for anterior loss;Clear throat intermittently Postural Changes and/or Swallow Maneuvers: Seated upright 90 degrees                  Oral care BID   Intermittent Supervision/Assistance Dysphagia, unspecified (R13.10)     Discharge SLP treatment due to (comment);All goals met     Dustin Olam Bull  10/06/2023, 9:56 AM

## 2023-10-06 NOTE — Plan of Care (Signed)
  Problem: Coping: Goal: Ability to adjust to condition or change in health will improve Outcome: Progressing   Problem: Health Behavior/Discharge Planning: Goal: Ability to manage health-related needs will improve Outcome: Progressing   Problem: Clinical Measurements: Goal: Will remain free from infection Outcome: Progressing   Problem: Activity: Goal: Risk for activity intolerance will decrease Outcome: Progressing   Problem: Pain Management: Goal: General experience of comfort will improve Outcome: Progressing   Problem: Skin Integrity: Goal: Risk for impaired skin integrity will decrease Outcome: Progressing

## 2023-10-06 NOTE — Care Management Important Message (Signed)
 Important Message  Patient Details  Name: Adam [name] MRN: [id number] Date of Birth: Jan 14, 1951   Important Message Given:  Yes - Medicare IM     Dorena Bodo 10/06/2023, 4:33 PM

## 2023-10-06 NOTE — TOC Progression Note (Addendum)
 Transition of Care [facility]) - Progression Note    Patient Details  Name: NORVIN OHLIN MRN: [id number] Date of Birth: [birth year]  Transition of Care [facility]) CM/SW Contact  Inocente GORMAN Kindle, LCSW Phone Number: 10/06/2023, 9:19 AM  Clinical Narrative:    9:19 AM-[facility] waiting for return call from DSS to confirm patient's Medicaid for ltc placement. Spouse and her friend updated at bedside. Spouse reports concern that patient will not agree to go to SNF.      Barriers to Discharge: Continued Medical Work up, English As A [employer] Language [employer], SNF Pending bed offer  Expected Discharge Plan and Services In-house Referral: Clinical Social Work, Hospice / Palliative Care   Post Acute Care Choice: Nursing Home Living arrangements for the past 2 months: Single Family Home                                       Social Determinants of Health (SDOH) Interventions SDOH Screenings   Food Insecurity: Patient Unable To Answer (09/30/2023)  Housing: Patient Unable To Answer (09/30/2023)  Transportation Needs: Patient Unable To Answer (09/30/2023)  Utilities: Patient Unable To Answer (09/30/2023)  Social Connections: Unknown (10/05/2023)  Tobacco Use: Low Risk  (09/30/2023)    Readmission Risk Interventions     No data to display

## 2023-10-07 DIAGNOSIS — J4489 Other specified chronic obstructive pulmonary disease: Secondary | ICD-10-CM | POA: Diagnosis not present

## 2023-10-07 DIAGNOSIS — I480 Paroxysmal atrial fibrillation: Secondary | ICD-10-CM | POA: Diagnosis not present

## 2023-10-07 DIAGNOSIS — D649 Anemia, unspecified: Secondary | ICD-10-CM | POA: Diagnosis not present

## 2023-10-07 DIAGNOSIS — E43 Unspecified severe protein-calorie malnutrition: Secondary | ICD-10-CM | POA: Diagnosis not present

## 2023-10-07 LAB — GLUCOSE, CAPILLARY
Glucose-Capillary: 130 mg/dL — ABNORMAL HIGH (ref 70–99)
Glucose-Capillary: 149 mg/dL — ABNORMAL HIGH (ref 70–99)
Glucose-Capillary: 107 mg/dL — ABNORMAL HIGH (ref 70–99)
Glucose-Capillary: 160 mg/dL — ABNORMAL HIGH (ref 70–99)

## 2023-10-07 NOTE — Progress Notes (Signed)
 PROGRESS NOTE        PATIENT DETAILS Name: Adam [name] Age: 73 y.o. Sex: male Date of Birth: [birth year] Admit Date: 09/30/2023 Admitting Physician Isaiah Geralds, MD ERE:Almryzuuz, [name], MD  Brief Summary: Patient is a 73 y.o.  male with history of EtOH use, Warnicke's encephalopathy, pseudobulbar palsy, chronic aphasia, HTN, CAD s/p CABG, PAF, DM-2, COPD-who presented to the hospital following a mechanical fall-he was found to have right thigh hematoma with acute blood loss anemia-subsequently admitted to the hospitalist service for further evaluation and treatment.  Significant events: 12/27>> admit to [facility]  Significant studies: 12/27>> CXR: No PNA 12/27>> pelvic x-ray: No traumatic injury 12/27>> CT head: No acute intracranial abnormality. 12/27>> CT C-spine: No fracture 12/27>> CT chest/abdomen/pelvis: Soft tissue hematoma right hip, no acute injuries. 12/27>> x-ray left femur: No fracture. 12/27>> x-ray left knee: No fracture.  Significant microbiology data: None  Procedures: None  Consults: Palliative care  Subjective: Lying comfortably in bed-once his suture removed from the right infraorbital area and staples removed from his scalp   Objective: Vitals: Blood pressure 101/73, pulse 74, temperature 98 F (36.7 C), temperature source Axillary, resp. rate 12, height 5' 10 (1.778 m), weight 68 kg, SpO2 97%.   Exam: Gen Exam:Alert awake-not in any distress HEENT:atraumatic, normocephalic Chest: B/L clear to auscultation anteriorly CVS:S1S2 regular Abdomen:soft non tender, non distended Extremities:no edema Neurology: Non focal Skin: no rash  Pertinent Labs/Radiology:    Latest Ref Rng & Units 10/05/2023    5:14 AM 10/04/2023    4:43 AM 10/03/2023    4:47 AM  CBC  WBC 4.0 - 10.5 K/uL 5.7  6.4  7.1   Hemoglobin 13.0 - 17.0 g/dL 8.4  8.7  8.3   Hematocrit 39.0 - 52.0 % 25.7  26.7  24.3   Platelets 150 - 400 K/uL 182  187   177     Lab Results  Component Value Date   NA 137 10/05/2023   K 3.5 10/05/2023   CL 106 10/05/2023   CO2 24 10/05/2023      Assessment/Plan: Acute blood loss anemia secondary to right thigh hematoma Managed with supportive care-did require 1 unit of PRBC on 12/27-none since then. Hb remains stable Follow CBC periodically.  Mechanical fall at home in the setting of alcohol use History of frequent falls-due to pseudobulbar palsy/Warnicke's encephalopathy Extensive imaging studies negative for fracture Plans are for SNF-spoke with spouse 1/2-she is unable to care of this patient any longer-and is requesting SNF.  Per spouse-all he does is drink, gets violent with her if he does not get alcohol and has multiple falls daily. Remove suture from area below right eye today and staples from scalp  History of pseudobulbar palsy/Wernicke's encephalopathy/motor aphasia Aphasic at baseline-communicates with signs/writing. Follows with neurology in the outpatient setting  Dysphagia Chronic issue-related to pseudobulbar palsy/Wernicke's encephalopathy Has refused PEG tube in the past After discussion with patient/family-diet resumed with known aspiration risk. SLP following  EtOH use Not sure if he has any intention of quitting Should be out of window for any withdrawal symptoms at this point  PAF Sinus rhythm Amiodarone  Beta-blocker held due to hypotension. Reviewed prior notes-numerous falls-ongoing alcohol use-agree with Dr. Shellie a good long-term candidate for anticoagulation.  CAD s/p CABG 2011 No anginal symptoms  HLD Statin  HTN BP stable All antihypertensives on hold  as BP soft.  DM-2 (A1c 5.13 August 2023) CBG stable on SSI Resume oral hypoglycemics on discharge  Recent Labs    10/06/23 2014 10/07/23 0802 10/07/23 1157  GLUCAP 137* 130* 149*     Cholelithiasis Incidental finding on CT imaging Outpatient follow-up with PCP/general surgery  4.3  cm ascending aortic aneurysm Incidental finding on CT imaging-annual outpatient CTA/MRA recommended  Mood disorder Cymbalta   COPD/asthma Stable As needed bronchodilators  Palliative care Previously DNR-now full code Very poor underlying prognosis given his underlying neurological issues-ongoing alcohol use Palliative care following.  Nutrition Status: Nutrition Problem: Severe Malnutrition Etiology: chronic illness Signs/Symptoms: severe fat depletion, severe muscle depletion Interventions: Ensure Enlive (each supplement provides 350kcal and 20 grams of protein), MVI, Magic cup    BMI: Estimated body mass index is 21.52 kg/m as calculated from the following:   Height as of this encounter: 5' 10 (1.778 m).   Weight as of this encounter: 68 kg.   Code status:   Code Status: Full Code   DVT Prophylaxis: enoxaparin  (LOVENOX ) injection 40 mg Start: 10/03/23 2200 SCDs Start: 09/30/23 1135 Place TED hose Start: 09/30/23 1117   Family Communication: Spouse-Adam [name]-updated 1/2   Disposition Plan: Status is: Inpatient Remains inpatient appropriate because: Severity of illness   Planned Discharge Destination:Skilled nursing facility-unsafe discharge home-as spouse is not able to take care of him.   Diet: Diet Order             Diet full liquid Room service appropriate? No; Fluid consistency: Thin  Diet effective now                     Antimicrobial agents: Anti-infectives (From admission, onward)    None        MEDICATIONS: Scheduled Meds:  sodium chloride    Intravenous Once   amiodarone   100 mg Oral Daily   docusate sodium   100 mg Oral BID   enoxaparin  (LOVENOX ) injection  40 mg Subcutaneous Q24H   feeding supplement  237 mL Oral BID BM   folic acid   1 mg Oral Daily   insulin  aspart  0-9 Units Subcutaneous TID WC   multivitamin with minerals  1 tablet Oral Daily   pantoprazole   40 mg Oral Daily   Continuous Infusions:   PRN  Meds:.acetaminophen  **OR** acetaminophen , HYDROcodone -acetaminophen , ipratropium-albuterol , morphine  injection, ondansetron  **OR** ondansetron  (ZOFRAN ) IV, polyethylene glycol   I have personally reviewed following labs and imaging studies  LABORATORY DATA: CBC: Recent Labs  Lab 10/01/23 0324 10/02/23 0316 10/03/23 0447 10/04/23 0443 10/05/23 0514  WBC 5.6 6.0 7.1 6.4 5.7  HGB 8.0* 8.3* 8.3* 8.7* 8.4*  HCT 24.1* 24.8* 24.3* 26.7* 25.7*  MCV 96.4 95.8 95.3 98.2 99.6  PLT 147* 173 177 187 182    Basic Metabolic Panel: Recent Labs  Lab 10/01/23 0324 10/01/23 1409 10/02/23 0316 10/03/23 0447 10/04/23 0443 10/05/23 0514  NA 142  --  141 137 141 137  K 3.3*  --  3.2* 3.1* 3.4* 3.5  CL 109  --  108 106 108 106  CO2 24  --  23 23 23 24   GLUCOSE 103*  --  95 106* 91 110*  BUN 11  --  9 8 9 8   CREATININE 0.89  --  0.85 0.82 0.78 0.65  CALCIUM  8.7*  --  8.6* 8.6* 9.0 8.8*  MG  --  1.8  --  1.8  --   --   PHOS  --   --  2.6 2.9  --   --  GFR: Estimated Creatinine Clearance: 79.1 mL/min (by C-G formula based on SCr of 0.65 mg/dL).  Liver Function Tests: No results for input(s): AST, ALT, ALKPHOS, BILITOT, PROT, ALBUMIN in the last 168 hours.  No results for input(s): LIPASE, AMYLASE in the last 168 hours. No results for input(s): AMMONIA in the last 168 hours.  Coagulation Profile: No results for input(s): INR, PROTIME in the last 168 hours.   Cardiac Enzymes: No results for input(s): CKTOTAL, CKMB, CKMBINDEX, TROPONINI in the last 168 hours.   BNP (last 3 results) No results for input(s): PROBNP in the last 8760 hours.  Lipid Profile: No results for input(s): CHOL, HDL, LDLCALC, TRIG, CHOLHDL, LDLDIRECT in the last 72 hours.  Thyroid  Function Tests: No results for input(s): TSH, T4TOTAL, FREET4, T3FREE, THYROIDAB in the last 72 hours.  Anemia Panel: No results for input(s): VITAMINB12, FOLATE,  FERRITIN, TIBC, IRON, RETICCTPCT in the last 72 hours.  Urine analysis: No results found for: COLORURINE, APPEARANCEUR, LABSPEC, PHURINE, GLUCOSEU, HGBUR, BILIRUBINUR, KETONESUR, PROTEINUR, UROBILINOGEN, NITRITE, LEUKOCYTESUR  Sepsis Labs: Lactic Acid, Venous    Component Value Date/Time   LATICACIDVEN 0.9 09/30/2023 0549    MICROBIOLOGY: No results found for this or any previous visit (from the past 240 hours).  RADIOLOGY STUDIES/RESULTS: No results found.   LOS: 7 days   Donalda Applebaum, MD  [facility]    To contact the attending provider between 7A-7P or the covering provider during after hours 7P-7A, please log into the web site [email or web address] and access using universal [place] password for that web site. If you do not have the password, please call the hospital operator.  10/07/2023, 12:10 PM

## 2023-10-07 NOTE — Progress Notes (Signed)
 Staples and one suture removed as per order.

## 2023-10-07 NOTE — Progress Notes (Signed)
 OT Cancellation Note  Patient Details Name: SACHA RADLOFF MRN: [id number] DOB: Apr 24, 1951   Cancelled Treatment:    Reason Eval/Treat Not Completed: Other (comment).  Attempted skilled OT treatment session. Pt. Declines via writing on dry erase board that he is not awake yet. Gestured no to attempts later today and gave thumbs up when asked about tomorrow.  Will attempt back as able.    CHRISTELLA Nest Lorraine-COTA/L 10/07/2023, 2:00 PM

## 2023-10-07 NOTE — Progress Notes (Signed)
 PT Cancellation Note  Patient Details Name: Adam [name] MRN: [id number] DOB: 1951/04/30   Cancelled Treatment:    Reason Eval/Treat Not Completed: (P) Patient declined, no reason specified (Pt initially agreeable to session, however then writes that he needs more time to wake up and declines. Will follow up as able.)   Darryle George 10/07/2023, 10:16 AM

## 2023-10-07 NOTE — Plan of Care (Signed)
  Problem: Coping: Goal: Ability to adjust to condition or change in health will improve Outcome: Progressing   Problem: Metabolic: Goal: Ability to maintain appropriate glucose levels will improve Outcome: Progressing   Problem: Nutritional: Goal: Maintenance of adequate nutrition will improve Outcome: Progressing   Problem: Skin Integrity: Goal: Risk for impaired skin integrity will decrease Outcome: Progressing   Problem: Activity: Goal: Risk for activity intolerance will decrease Outcome: Progressing   Problem: Pain Management: Goal: General experience of comfort will improve Outcome: Progressing   Problem: Safety: Goal: Ability to remain free from injury will improve Outcome: Progressing

## 2023-10-07 NOTE — Plan of Care (Signed)

## 2023-10-07 NOTE — Progress Notes (Signed)
 Mobility Specialist Progress Note;   10/07/23 1005  Mobility  Activity Transferred from bed to chair  Level of Assistance Minimal assist, patient does 75% or more  Assistive Device Front wheel walker  Distance Ambulated (ft) 3 ft  Activity Response Tolerated well  Mobility Referral Yes  Mobility visit 1 Mobility  Mobility Specialist Start Time (ACUTE ONLY) 1005  Mobility Specialist Stop Time (ACUTE ONLY) 1020  Mobility Specialist Time Calculation (min) (ACUTE ONLY) 15 min   Pt agreeable to mobility. Transferred pt from bed to chair requiring MinA for all mobility. VSS throughout and no c/o when asked. Pt left comfortably in chair with all needs met eating breakfast. Alarm on.   Lauraine Erm Mobility Specialist Please contact via SecureChat or [employer] [phone]

## 2023-10-08 DIAGNOSIS — J4489 Other specified chronic obstructive pulmonary disease: Secondary | ICD-10-CM | POA: Diagnosis not present

## 2023-10-08 DIAGNOSIS — I480 Paroxysmal atrial fibrillation: Secondary | ICD-10-CM | POA: Diagnosis not present

## 2023-10-08 DIAGNOSIS — D649 Anemia, unspecified: Secondary | ICD-10-CM | POA: Diagnosis not present

## 2023-10-08 DIAGNOSIS — E43 Unspecified severe protein-calorie malnutrition: Secondary | ICD-10-CM | POA: Diagnosis not present

## 2023-10-08 LAB — GLUCOSE, CAPILLARY
Glucose-Capillary: 122 mg/dL — ABNORMAL HIGH (ref 70–99)
Glucose-Capillary: 165 mg/dL — ABNORMAL HIGH (ref 70–99)
Glucose-Capillary: 137 mg/dL — ABNORMAL HIGH (ref 70–99)
Glucose-Capillary: 146 mg/dL — ABNORMAL HIGH (ref 70–99)

## 2023-10-08 NOTE — Plan of Care (Signed)
  Problem: Fluid Volume: Goal: Ability to maintain a balanced intake and output will improve Outcome: Progressing   Problem: Health Behavior/Discharge Planning: Goal: Ability to identify and utilize available resources and services will improve Outcome: Progressing   Problem: Nutritional: Goal: Maintenance of adequate nutrition will improve Outcome: Progressing   Problem: Metabolic: Goal: Ability to maintain appropriate glucose levels will improve Outcome: Progressing   Problem: Skin Integrity: Goal: Risk for impaired skin integrity will decrease Outcome: Progressing   Problem: Education: Goal: Knowledge of General Education information will improve Description: Including pain rating scale, medication(s)/side effects and non-pharmacologic comfort measures Outcome: Progressing   Problem: Tissue Perfusion: Goal: Adequacy of tissue perfusion will improve Outcome: Progressing

## 2023-10-08 NOTE — Plan of Care (Signed)
  Problem: Education: Goal: Ability to describe self-care measures that may prevent or decrease complications (Diabetes Survival Skills Education) will improve Outcome: Progressing Goal: Individualized Educational Video(s) Outcome: Progressing   Problem: Coping: Goal: Ability to adjust to condition or change in health will improve Outcome: Progressing   Problem: Fluid Volume: Goal: Ability to maintain a balanced intake and output will improve Outcome: Progressing   Problem: Health Behavior/Discharge Planning: Goal: Ability to identify and utilize available resources and services will improve Outcome: Progressing Goal: Ability to manage health-related needs will improve Outcome: Progressing   Problem: Nutritional: Goal: Maintenance of adequate nutrition will improve Outcome: Progressing Goal: Progress toward achieving an optimal weight will improve Outcome: Progressing   Problem: Skin Integrity: Goal: Risk for impaired skin integrity will decrease Outcome: Progressing   Problem: Tissue Perfusion: Goal: Adequacy of tissue perfusion will improve Outcome: Progressing   Problem: Education: Goal: Knowledge of General Education information will improve Description: Including pain rating scale, medication(s)/side effects and non-pharmacologic comfort measures Outcome: Progressing   Problem: Clinical Measurements: Goal: Diagnostic test results will improve Outcome: Progressing Goal: Respiratory complications will improve Outcome: Progressing Goal: Cardiovascular complication will be avoided Outcome: Progressing   Problem: Activity: Goal: Risk for activity intolerance will decrease Outcome: Progressing   Problem: Elimination: Goal: Will not experience complications related to urinary retention Outcome: Progressing   Problem: Pain Management: Goal: General experience of comfort will improve Outcome: Progressing

## 2023-10-08 NOTE — Progress Notes (Signed)
 PROGRESS NOTE        PATIENT DETAILS Name: Adam [name] Age: 73 y.o. Sex: male Date of Birth: 1950/10/20 Admit Date: 09/30/2023 Admitting Physician Isaiah Geralds, MD ERE:Almryzuuz, [name], MD  Brief Summary: Patient is a 73 y.o.  male with history of EtOH use, Warnicke's encephalopathy, pseudobulbar palsy, chronic aphasia, HTN, CAD s/p CABG, PAF, DM-2, COPD-who presented to the hospital following a mechanical fall-he was found to have right thigh hematoma with acute blood loss anemia-subsequently admitted to the hospitalist service for further evaluation and treatment.  Significant events: 12/27>> admit to [facility]  Significant studies: 12/27>> CXR: No PNA 12/27>> pelvic x-ray: No traumatic injury 12/27>> CT head: No acute intracranial abnormality. 12/27>> CT C-spine: No fracture 12/27>> CT chest/abdomen/pelvis: Soft tissue hematoma right hip, no acute injuries. 12/27>> x-ray left femur: No fracture. 12/27>> x-ray left knee: No fracture.  Significant microbiology data: None  Procedures: None  Consults: Palliative care  Subjective: No complaints-gives me a thumbs up-awaiting SNF bed.  Staples/sutures removed 1/3.  Objective: Vitals: Blood pressure 111/69, pulse 65, temperature 98.2 F (36.8 C), temperature source Axillary, resp. rate 16, height 5' 10 (1.778 m), weight 68 kg, SpO2 98%.   Exam: Gen Exam:Alert awake-not in any distress-aphasic at baseline HEENT:atraumatic, normocephalic Chest: B/L clear to auscultation anteriorly CVS:S1S2 regular Abdomen:soft non tender, non distended Extremities:no edema Neurology: Non focal Skin: no rash  Pertinent Labs/Radiology:    Latest Ref Rng & Units 10/05/2023    5:14 AM 10/04/2023    4:43 AM 10/03/2023    4:47 AM  CBC  WBC 4.0 - 10.5 K/uL 5.7  6.4  7.1   Hemoglobin 13.0 - 17.0 g/dL 8.4  8.7  8.3   Hematocrit 39.0 - 52.0 % 25.7  26.7  24.3   Platelets 150 - 400 K/uL 182  187  177     Lab  Results  Component Value Date   NA 137 10/05/2023   K 3.5 10/05/2023   CL 106 10/05/2023   CO2 24 10/05/2023      Assessment/Plan: Acute blood loss anemia secondary to right thigh hematoma Managed with supportive care-did require 1 unit of PRBC on 12/27-none since then. Hb remains stable Follow CBC periodically.  Mechanical fall at home in the setting of alcohol use History of frequent falls-due to pseudobulbar palsy/Warnicke's encephalopathy Extensive imaging studies negative for fracture Sutures from right cheek/staples from occipital area-removed 1/3 Plans are for SNF-spoke with spouse 1/2-she is unable to care of this patient any longer-and is requesting SNF.  Per spouse-all he does is drink, gets violent with her if he does not get alcohol and has multiple falls daily.  History of pseudobulbar palsy/Wernicke's encephalopathy/motor aphasia Aphasic at baseline-communicates with signs/writing. Follows with neurology in the outpatient setting  Dysphagia Chronic issue-related to pseudobulbar palsy/Wernicke's encephalopathy Has refused PEG tube in the past After discussion with patient/family-diet resumed with known aspiration risk. SLP following  EtOH use Not sure if he has any intention of quitting Should be out of window for any withdrawal symptoms at this point  PAF Sinus rhythm Amiodarone  Beta-blocker held due to hypotension. Reviewed prior notes-numerous falls-ongoing alcohol use-agree with Dr. Shellie a good long-term candidate for anticoagulation.  CAD s/p CABG 2011 No anginal symptoms  HLD Statin  HTN BP stable All antihypertensives on hold as BP soft.  DM-2 (A1c 5.13 August 2023)  CBG stable on SSI Resume oral hypoglycemics on discharge  Recent Labs    10/07/23 1513 10/07/23 2146 10/08/23 0730  GLUCAP 107* 160* 122*     Cholelithiasis Incidental finding on CT imaging Outpatient follow-up with PCP/general surgery  4.3 cm ascending  aortic aneurysm Incidental finding on CT imaging-annual outpatient CTA/MRA recommended  Mood disorder Cymbalta   COPD/asthma Stable As needed bronchodilators  Palliative care Previously DNR-now full code Very poor underlying prognosis given his underlying neurological issues-ongoing alcohol use Palliative care following.  Nutrition Status: Nutrition Problem: Severe Malnutrition Etiology: chronic illness Signs/Symptoms: severe fat depletion, severe muscle depletion Interventions: Ensure Enlive (each supplement provides 350kcal and 20 grams of protein), MVI, Magic cup    BMI: Estimated body mass index is 21.52 kg/m as calculated from the following:   Height as of this encounter: 5' 10 (1.778 m).   Weight as of this encounter: 68 kg.   Code status:   Code Status: Full Code   DVT Prophylaxis: enoxaparin  (LOVENOX ) injection 40 mg Start: 10/03/23 2200 SCDs Start: 09/30/23 1135 Place TED hose Start: 09/30/23 1117   Family Communication: Spouse-[name]-[phone]-updated 1/2   Disposition Plan: Status is: Inpatient Remains inpatient appropriate because: Severity of illness   Planned Discharge Destination:Skilled nursing facility-unsafe discharge home-as spouse is not able to take care of him.   Diet: Diet Order             Diet full liquid Room service appropriate? No; Fluid consistency: Thin  Diet effective now                     Antimicrobial agents: Anti-infectives (From admission, onward)    None        MEDICATIONS: Scheduled Meds:  sodium chloride    Intravenous Once   amiodarone   100 mg Oral Daily   docusate sodium   100 mg Oral BID   enoxaparin  (LOVENOX ) injection  40 mg Subcutaneous Q24H   feeding supplement  237 mL Oral BID BM   folic acid   1 mg Oral Daily   insulin  aspart  0-9 Units Subcutaneous TID WC   multivitamin with minerals  1 tablet Oral Daily   pantoprazole   40 mg Oral Daily   Continuous Infusions:   PRN Meds:.acetaminophen   **OR** acetaminophen , HYDROcodone -acetaminophen , ipratropium-albuterol , morphine  injection, ondansetron  **OR** ondansetron  (ZOFRAN ) IV, polyethylene glycol   I have personally reviewed following labs and imaging studies  LABORATORY DATA: CBC: Recent Labs  Lab 10/02/23 0316 10/03/23 0447 10/04/23 0443 10/05/23 0514  WBC 6.0 7.1 6.4 5.7  HGB 8.3* 8.3* 8.7* 8.4*  HCT 24.8* 24.3* 26.7* 25.7*  MCV 95.8 95.3 98.2 99.6  PLT 173 177 187 182    Basic Metabolic Panel: Recent Labs  Lab 10/01/23 1409 10/02/23 0316 10/03/23 0447 10/04/23 0443 10/05/23 0514  NA  --  141 137 141 137  K  --  3.2* 3.1* 3.4* 3.5  CL  --  108 106 108 106  CO2  --  23 23 23 24   GLUCOSE  --  95 106* 91 110*  BUN  --  9 8 9 8   CREATININE  --  0.85 0.82 0.78 0.65  CALCIUM   --  8.6* 8.6* 9.0 8.8*  MG 1.8  --  1.8  --   --   PHOS  --  2.6 2.9  --   --     GFR: Estimated Creatinine Clearance: 79.1 mL/min (by C-G formula based on SCr of 0.65 mg/dL).  Liver Function Tests: No results for input(s):  AST, ALT, ALKPHOS, BILITOT, PROT, ALBUMIN in the last 168 hours.  No results for input(s): LIPASE, AMYLASE in the last 168 hours. No results for input(s): AMMONIA in the last 168 hours.  Coagulation Profile: No results for input(s): INR, PROTIME in the last 168 hours.   Cardiac Enzymes: No results for input(s): CKTOTAL, CKMB, CKMBINDEX, TROPONINI in the last 168 hours.   BNP (last 3 results) No results for input(s): PROBNP in the last 8760 hours.  Lipid Profile: No results for input(s): CHOL, HDL, LDLCALC, TRIG, CHOLHDL, LDLDIRECT in the last 72 hours.  Thyroid  Function Tests: No results for input(s): TSH, T4TOTAL, FREET4, T3FREE, THYROIDAB in the last 72 hours.  Anemia Panel: No results for input(s): VITAMINB12, FOLATE, FERRITIN, TIBC, IRON, RETICCTPCT in the last 72 hours.  Urine analysis: No results found for: COLORURINE,  APPEARANCEUR, LABSPEC, PHURINE, GLUCOSEU, HGBUR, BILIRUBINUR, KETONESUR, PROTEINUR, UROBILINOGEN, NITRITE, LEUKOCYTESUR  Sepsis Labs: Lactic Acid, Venous    Component Value Date/Time   LATICACIDVEN 0.9 09/30/2023 0549    MICROBIOLOGY: No results found for this or any previous visit (from the past 240 hours).  RADIOLOGY STUDIES/RESULTS: No results found.   LOS: 8 days   Donalda Applebaum, MD  [facility]    To contact the attending provider between 7A-7P or the covering provider during after hours 7P-7A, please log into the web site [email or web address] and access using universal [place] password for that web site. If you do not have the password, please call the hospital operator.  10/08/2023, 10:24 AM

## 2023-10-09 DIAGNOSIS — D649 Anemia, unspecified: Secondary | ICD-10-CM | POA: Diagnosis not present

## 2023-10-09 DIAGNOSIS — I480 Paroxysmal atrial fibrillation: Secondary | ICD-10-CM | POA: Diagnosis not present

## 2023-10-09 DIAGNOSIS — Z515 Encounter for palliative care: Secondary | ICD-10-CM | POA: Diagnosis not present

## 2023-10-09 DIAGNOSIS — J4489 Other specified chronic obstructive pulmonary disease: Secondary | ICD-10-CM | POA: Diagnosis not present

## 2023-10-09 DIAGNOSIS — E43 Unspecified severe protein-calorie malnutrition: Secondary | ICD-10-CM | POA: Diagnosis not present

## 2023-10-09 LAB — GLUCOSE, CAPILLARY
Glucose-Capillary: 125 mg/dL — ABNORMAL HIGH (ref 70–99)
Glucose-Capillary: 178 mg/dL — ABNORMAL HIGH (ref 70–99)
Glucose-Capillary: 199 mg/dL — ABNORMAL HIGH (ref 70–99)
Glucose-Capillary: 133 mg/dL — ABNORMAL HIGH (ref 70–99)

## 2023-10-09 MED ORDER — AMIODARONE HCL 200 MG PO TABS
200.0000 mg | ORAL_TABLET | Freq: Every day | ORAL | Status: DC
  Administered 2023-10-09 – 2023-10-10 (×2): 200 mg via ORAL
  Filled 2023-10-09 (×2): qty 1

## 2023-10-09 MED ORDER — DEXTROMETHORPHAN-QUINIDINE 20-10 MG PO CAPS
1.0000 | ORAL_CAPSULE | Freq: Every day | ORAL | Status: DC
  Administered 2023-10-09 – 2023-10-10 (×2): 1 via ORAL
  Filled 2023-10-09 (×3): qty 1

## 2023-10-09 MED ORDER — NAPHAZOLINE-PHENIRAMINE 0.025-0.3 % OP SOLN: 1.0000 [drp] | Freq: Four times a day (QID) | OPHTHALMIC | Status: DC | PRN

## 2023-10-09 MED ORDER — ALLOPURINOL 300 MG PO TABS
300.0000 mg | ORAL_TABLET | Freq: Every day | ORAL | Status: DC
  Administered 2023-10-09 – 2023-10-10 (×2): 300 mg via ORAL
  Filled 2023-10-09 (×2): qty 1

## 2023-10-09 MED ORDER — ROSUVASTATIN CALCIUM 20 MG PO TABS
40.0000 mg | ORAL_TABLET | Freq: Every day | ORAL | Status: DC
  Administered 2023-10-09 – 2023-10-10 (×2): 40 mg via ORAL
  Filled 2023-10-09 (×2): qty 2

## 2023-10-09 MED ORDER — DULOXETINE HCL 60 MG PO CPEP
60.0000 mg | ORAL_CAPSULE | Freq: Every day | ORAL | Status: DC
  Administered 2023-10-09 – 2023-10-10 (×2): 60 mg via ORAL
  Filled 2023-10-09 (×2): qty 1

## 2023-10-09 NOTE — Progress Notes (Signed)
   Palliative Medicine Inpatient Follow Up Note HPI: 73 y.o. male  with past medical history of PAF on eliquis , HTN, HLD, CAD, T2DM, thoracic AA, OSA, alcohol abuse with hx of wernicke encephalopathy and aphasia, pseudobulbar palsy, non verbal state was admitted on 09/30/2023 with initial encounter fall at home on blood thinner with head injury, acute on chronic anemia, hypokalemia, dysphagia, progressive neurological disorder with pseudobulbar palsy, aphasia, Warnicke encephalopathy.   Palliative care has been asked to further assist with goals of care conversation.   Today's Discussion 10/09/2023  *Please note that this is a verbal dictation therefore any spelling or grammatical errors are due to the [employer] One system interpretation.  Chart reviewed inclusive of vital signs, progress notes, laboratory results, and diagnostic images.  I met with Adam [name] at bedside this morning. He shares that he is distressed that he has received grits for breakfast eight days in a row and requests potato soup instead. He shares that this has been requested. He denies pain, shortness of breath, or nausea. He denies additional concerns.  I spoke with patients wife, Adam [name] who states that Adam [name] will not agree to nursing home placement. She has determined that she will take him home once the medical team states he is stable. She is open to OP Palliative support.  Adam [name] vocalizes that if Adam [name] falls again in the home she will readdress the long term placement option.   Questions and concerns addressed/Palliative Support Provided.   Objective Assessment: Vital Signs Vitals:   10/09/23 0100 10/09/23 0800  BP:  102/70  Pulse:  71  Resp:  11  Temp: 97.9 F (36.6 C) 97.6 F (36.4 C)  SpO2:  96%    Intake/Output Summary (Last 24 hours) at 10/09/2023 1137 Last data filed at 10/08/2023 2200 Gross per 24 hour  Intake 750 ml  Output 1300 ml  Net -550 ml   Last Weight  Most recent update: 09/30/2023   1:34 AM    Weight  68 kg (150 lb)            Gen:  Chronically ill appearing Caucasian M in NAD HEENT: moist mucous membranes CV: Regular rate and rhythm  PULM:  On RA, breathing is even and not labored ABD: soft/nontender  EXT: No edema  Neuro: Alert and oriented x3 - uses hand gestures and writes on paper to communicate as is not verbal  SUMMARY OF RECOMMENDATIONS   Full Code --> Patient does have a living will and his wife shares that he previously wanted to be a DNAR though now patient has changed his mind to be full resuscitative efforts  Plan for Palliative support on discharge  Patients wife, Adam [name] states that she needs to take him home at this point given that he will not agree to placement  Palliative care will remain peripherally involved here  Billing based on MDM: Moderate ______________________________________________________________________________________ Adam [name] Palliative Medicine Team Team Cell Phone: [phone] Please utilize secure chat with additional questions, if there is no response within 30 minutes please call the above phone number  Palliative Medicine Team providers are available by phone from 7am to 7pm daily and can be reached through the team cell phone.  Should this patient require assistance outside of these hours, please call the patient's attending physician.

## 2023-10-09 NOTE — Progress Notes (Signed)
 Mobility Specialist Progress Note:    10/09/23 1449  Mobility  Activity Ambulated with assistance in hallway  Level of Assistance Minimal assist, patient does 75% or more  Assistive Device Front wheel walker  Distance Ambulated (ft) 150 ft  Activity Response Tolerated well  Mobility Referral Yes  Mobility visit 1 Mobility  Mobility Specialist Start Time (ACUTE ONLY) 1350  Mobility Specialist Stop Time (ACUTE ONLY) 1410  Mobility Specialist Time Calculation (min) (ACUTE ONLY) 20 min   Pt received in bed agreeable to mobility. Pt needed MinA w/ bed mobility and STS. Contact guard during ambulation. No c/o throughout session. Returned to room w/o fault. Situated back in bed w/ call bell and personal belongings in reach. All needs met. Bed alarm on.  Thersia Minder Mobility Specialist  Please contact vis Secure Chat or  Rehab Office [phone]

## 2023-10-09 NOTE — Progress Notes (Signed)
 PROGRESS NOTE        PATIENT DETAILS Name: Adam [name] Age: 73 y.o. Sex: male Date of Birth: [birth year] Admit Date: 09/30/2023 Admitting Physician Isaiah Geralds, MD ERE:Almryzuuz, [name], MD  Brief Summary: Patient is a 73 y.o.  male with history of EtOH use, Warnicke's encephalopathy, pseudobulbar palsy, chronic aphasia, HTN, CAD s/p CABG, PAF, DM-2, COPD-who presented to the hospital following a mechanical fall-he was found to have right thigh hematoma with acute blood loss anemia-subsequently admitted to the hospitalist service for further evaluation and treatment.  Significant events: 12/27>> admit to [facility]  Significant studies: 12/27>> CXR: No PNA 12/27>> pelvic x-ray: No traumatic injury 12/27>> CT head: No acute intracranial abnormality. 12/27>> CT C-spine: No fracture 12/27>> CT chest/abdomen/pelvis: Soft tissue hematoma right hip, no acute injuries. 12/27>> x-ray left femur: No fracture. 12/27>> x-ray left knee: No fracture.  Significant microbiology data: None  Procedures: None  Consults: Palliative care  Subjective: Lying flat in bed-comfortable-awake-gives me the thumbs up when asked if he has any issues.  Gives me the thumbs down when I tell him that we are still waiting for SNF bed.    Objective: Vitals: Blood pressure 102/70, pulse 71, temperature 97.6 F (36.4 C), temperature source Axillary, resp. rate 11, height 5' 10 (1.778 m), weight 68 kg, SpO2 96%.   Exam: Gen Exam:Alert awake-not in any distress-aphasic at baseline. HEENT:atraumatic, normocephalic Chest: B/L clear to auscultation anteriorly CVS:S1S2 regular Abdomen:soft non tender, non distended Extremities:no edema Neurology: Non focal Skin: no rash  Pertinent Labs/Radiology:    Latest Ref Rng & Units 10/05/2023    5:14 AM 10/04/2023    4:43 AM 10/03/2023    4:47 AM  CBC  WBC 4.0 - 10.5 K/uL 5.7  6.4  7.1   Hemoglobin 13.0 - 17.0 g/dL 8.4  8.7  8.3    Hematocrit 39.0 - 52.0 % 25.7  26.7  24.3   Platelets 150 - 400 K/uL 182  187  177     Lab Results  Component Value Date   NA 137 10/05/2023   K 3.5 10/05/2023   CL 106 10/05/2023   CO2 24 10/05/2023      Assessment/Plan: Acute blood loss anemia secondary to right thigh hematoma Managed with supportive care-did require 1 unit of PRBC on 12/27-none since then. Hb remains stable Follow CBC periodically.  Mechanical fall at home in the setting of alcohol use History of frequent falls-due to pseudobulbar palsy/Warnicke's encephalopathy Extensive imaging studies negative for fracture Sutures from right cheek/staples from occipital area-removed 1/3 Plans are for SNF-spoke with spouse 1/2-she is unable to care of this patient any longer-and is requesting SNF.  Per spouse-all he does is drink, gets violent with her if he does not get alcohol and has multiple falls daily.  History of pseudobulbar palsy/Wernicke's encephalopathy/motor aphasia Aphasic at baseline-communicates with signs/writing. Follows with neurology in the outpatient setting  Dysphagia Chronic issue-related to pseudobulbar palsy/Wernicke's encephalopathy Has refused PEG tube in the past After discussion with patient/family-diet resumed with known aspiration risk. SLP following  EtOH use Not sure if he has any intention of quitting Should be out of window for any withdrawal symptoms at this point  PAF Sinus rhythm Amiodarone  Beta-blocker held due to hypotension. Reviewed prior notes-numerous falls-ongoing alcohol use-agree with Dr. Shellie a good long-term candidate for anticoagulation.  CAD s/p CABG 2011  No anginal symptoms  HLD Statin  HTN BP stable All antihypertensives on hold as BP soft.  DM-2 (A1c 5.13 August 2023) CBG stable on SSI Resume oral hypoglycemics on discharge  Recent Labs    10/08/23 1634 10/08/23 2129 10/09/23 0803  GLUCAP 137* 146* 125*      Cholelithiasis Incidental finding on CT imaging Outpatient follow-up with PCP/general surgery  4.3 cm ascending aortic aneurysm Incidental finding on CT imaging-annual outpatient CTA/MRA recommended  Mood disorder Cymbalta   COPD/asthma Stable As needed bronchodilators  Palliative care Previously DNR-now full code Very poor underlying prognosis given his underlying neurological issues-ongoing alcohol use Palliative care following.  Nutrition Status: Nutrition Problem: Severe Malnutrition Etiology: chronic illness Signs/Symptoms: severe fat depletion, severe muscle depletion Interventions: Ensure Enlive (each supplement provides 350kcal and 20 grams of protein), MVI, Magic cup    BMI: Estimated body mass index is 21.52 kg/m as calculated from the following:   Height as of this encounter: 5' 10 (1.778 m).   Weight as of this encounter: 68 kg.   Code status:   Code Status: Full Code   DVT Prophylaxis: enoxaparin  (LOVENOX ) injection 40 mg Start: 10/03/23 2200 SCDs Start: 09/30/23 1135 Place TED hose Start: 09/30/23 1117   Family Communication: Spouse-Adam [name]-[phone]-updated 1/2   Disposition Plan: Status is: Inpatient Remains inpatient appropriate because: Severity of illness   Planned Discharge Destination:Skilled nursing facility-unsafe discharge home-as spouse is not able to take care of him.   Diet: Diet Order             Diet full liquid Room service appropriate? No; Fluid consistency: Thin  Diet effective now                     Antimicrobial agents: Anti-infectives (From admission, onward)    None        MEDICATIONS: Scheduled Meds:  sodium chloride    Intravenous Once   allopurinol   300 mg Oral Daily   amiodarone   200 mg Oral Daily   Dextromethorphan -quiNIDine  1 capsule Oral Daily   docusate sodium   100 mg Oral BID   DULoxetine   60 mg Oral Daily   enoxaparin  (LOVENOX ) injection  40 mg Subcutaneous Q24H   feeding supplement   237 mL Oral BID BM   folic acid   1 mg Oral Daily   insulin  aspart  0-9 Units Subcutaneous TID WC   multivitamin with minerals  1 tablet Oral Daily   pantoprazole   40 mg Oral Daily   rosuvastatin   40 mg Oral Daily   Continuous Infusions:   PRN Meds:.acetaminophen  **OR** acetaminophen , HYDROcodone -acetaminophen , ipratropium-albuterol , morphine  injection, ondansetron  **OR** ondansetron  (ZOFRAN ) IV, polyethylene glycol   I have personally reviewed following labs and imaging studies  LABORATORY DATA: CBC: Recent Labs  Lab 10/03/23 0447 10/04/23 0443 10/05/23 0514  WBC 7.1 6.4 5.7  HGB 8.3* 8.7* 8.4*  HCT 24.3* 26.7* 25.7*  MCV 95.3 98.2 99.6  PLT 177 187 182    Basic Metabolic Panel: Recent Labs  Lab 10/03/23 0447 10/04/23 0443 10/05/23 0514  NA 137 141 137  K 3.1* 3.4* 3.5  CL 106 108 106  CO2 23 23 24   GLUCOSE 106* 91 110*  BUN 8 9 8   CREATININE 0.82 0.78 0.65  CALCIUM  8.6* 9.0 8.8*  MG 1.8  --   --   PHOS 2.9  --   --     GFR: Estimated Creatinine Clearance: 79.1 mL/min (by C-G formula based on SCr of 0.65 mg/dL).  Liver Function Tests:  No results for input(s): AST, ALT, ALKPHOS, BILITOT, PROT, ALBUMIN in the last 168 hours.  No results for input(s): LIPASE, AMYLASE in the last 168 hours. No results for input(s): AMMONIA in the last 168 hours.  Coagulation Profile: No results for input(s): INR, PROTIME in the last 168 hours.   Cardiac Enzymes: No results for input(s): CKTOTAL, CKMB, CKMBINDEX, TROPONINI in the last 168 hours.   BNP (last 3 results) No results for input(s): PROBNP in the last 8760 hours.  Lipid Profile: No results for input(s): CHOL, HDL, LDLCALC, TRIG, CHOLHDL, LDLDIRECT in the last 72 hours.  Thyroid  Function Tests: No results for input(s): TSH, T4TOTAL, FREET4, T3FREE, THYROIDAB in the last 72 hours.  Anemia Panel: No results for input(s): VITAMINB12, FOLATE,  FERRITIN, TIBC, IRON, RETICCTPCT in the last 72 hours.  Urine analysis: No results found for: COLORURINE, APPEARANCEUR, LABSPEC, PHURINE, GLUCOSEU, HGBUR, BILIRUBINUR, KETONESUR, PROTEINUR, UROBILINOGEN, NITRITE, LEUKOCYTESUR  Sepsis Labs: Lactic Acid, Venous    Component Value Date/Time   LATICACIDVEN 0.9 09/30/2023 0549    MICROBIOLOGY: No results found for this or any previous visit (from the past 240 hours).  RADIOLOGY STUDIES/RESULTS: No results found.   LOS: 9 days   Donalda Applebaum, MD  [facility]    To contact the attending provider between 7A-7P or the covering provider during after hours 7P-7A, please log into the web site [email or web address] and access using universal [place] password for that web site. If you do not have the password, please call the hospital operator.  10/09/2023, 10:39 AM

## 2023-10-10 ENCOUNTER — Other Ambulatory Visit (HOSPITAL_COMMUNITY): Payer: Self-pay

## 2023-10-10 ENCOUNTER — Encounter: Payer: Self-pay | Admitting: Family Medicine

## 2023-10-10 DIAGNOSIS — D649 Anemia, unspecified: Secondary | ICD-10-CM | POA: Diagnosis not present

## 2023-10-10 DIAGNOSIS — Z515 Encounter for palliative care: Secondary | ICD-10-CM | POA: Diagnosis not present

## 2023-10-10 DIAGNOSIS — F1010 Alcohol abuse, uncomplicated: Secondary | ICD-10-CM | POA: Diagnosis not present

## 2023-10-10 DIAGNOSIS — I480 Paroxysmal atrial fibrillation: Secondary | ICD-10-CM | POA: Diagnosis not present

## 2023-10-10 DIAGNOSIS — W19XXXA Unspecified fall, initial encounter: Secondary | ICD-10-CM

## 2023-10-10 DIAGNOSIS — Y92009 Unspecified place in unspecified non-institutional (private) residence as the place of occurrence of the external cause: Secondary | ICD-10-CM

## 2023-10-10 LAB — GLUCOSE, CAPILLARY: Glucose-Capillary: 159 mg/dL — ABNORMAL HIGH (ref 70–99)

## 2023-10-10 MED ORDER — ASPIRIN 81 MG PO TBEC: 81.0000 mg | DELAYED_RELEASE_TABLET | Freq: Every day | ORAL | Status: DC

## 2023-10-10 NOTE — Progress Notes (Signed)
   [facility] Liaison Note:  Notified by [facility] manager of patient/family request for [facility] Palliative services at home after discharge.   Please call with any hospice or outpatient palliative care related questions.   Thank you for the opportunity to participate in this patient's care.   Glenna Fellows, BSN, RN, OCN [employer] [phone]

## 2023-10-10 NOTE — TOC Transition Note (Signed)
 Transition of Care [facility]) - Discharge Note   Patient Details  Name: Adam [name] MRN: [id number] Date of Birth: Mar 04, 1951  [facility]) CM/SW Contact:  Hendricks KANDICE Her, RN Phone Number: 10/10/2023, 10:59 AM   Clinical Narrative:   Patient will DC to home today. Wife is caregiver. [facility] palliative will also provide services in the home   AVS has been updated  No additional TOC needs       Barriers to Discharge: Continued Medical Work up, English As A Second Language Teacher, SNF Pending bed offer   Patient Goals and CMS Choice Patient states their goals for this hospitalization and ongoing recovery are:: LTC placement CMS Medicare.gov Compare Post Acute Care list provided to:: Patient Represenative (must comment) Choice offered to / list presented to : Spouse [place] ownership interest in [facility].provided to:: Spouse    Discharge Placement                       Discharge Plan and Services Additional resources added to the After Visit Summary for   In-house Referral: Clinical Social Work, Hospice / Palliative Care   Post Acute Care Choice: Nursing Home                               Social Drivers of Health (SDOH) Interventions SDOH Screenings   Food Insecurity: Patient Unable To Answer (09/30/2023)  Housing: Patient Unable To Answer (09/30/2023)  Transportation Needs: Patient Unable To Answer (09/30/2023)  Utilities: Patient Unable To Answer (09/30/2023)  Social Connections: Unknown (10/05/2023)  Tobacco Use: Low Risk  (09/30/2023)     Readmission Risk Interventions     No data to display

## 2023-10-10 NOTE — Progress Notes (Signed)
 Mobility Specialist Progress Note;   10/10/23 1050  Mobility  Activity Ambulated with assistance in hallway  Level of Assistance Minimal assist, patient does 75% or more  Assistive Device Front wheel walker  Distance Ambulated (ft) 90 ft  Activity Response Tolerated well  Mobility Referral Yes  Mobility visit 1 Mobility  Mobility Specialist Start Time (ACUTE ONLY) 1050  Mobility Specialist Stop Time (ACUTE ONLY) 1108  Mobility Specialist Time Calculation (min) (ACUTE ONLY) 18 min   Pt agreeable to mobility. Required MinA during ambulation for safety. Verbal cues required for RW proximity while ambulating. No c/o when asked. Pt returned back to recliner with all needs met, wife in room. Waiting for d/c.  Lauraine Erm Mobility Specialist Please contact via SecureChat or [employer] [phone]

## 2023-10-10 NOTE — Progress Notes (Signed)
 Completed AVS family to arrive 11am for pick up.  Updated nurse and floor.

## 2023-10-10 NOTE — Progress Notes (Signed)
   Palliative Medicine Inpatient Follow Up Note HPI: 73 y.o. male  with past medical history of PAF on eliquis , HTN, HLD, CAD, T2DM, thoracic AA, OSA, alcohol abuse with hx of wernicke encephalopathy and aphasia, pseudobulbar palsy, non verbal state was admitted on 09/30/2023 with initial encounter fall at home on blood thinner with head injury, acute on chronic anemia, hypokalemia, dysphagia, progressive neurological disorder with pseudobulbar palsy, aphasia, Warnicke encephalopathy.   Palliative care has been asked to further assist with goals of care conversation.   Today's Discussion 10/10/2023  *Please note that this is a verbal dictation therefore any spelling or grammatical errors are due to the Dragon Medical One system interpretation.  Chart reviewed inclusive of vital signs, progress notes, laboratory results, and diagnostic images.  I met with Tanda at bedside this morning. He is in good spirits. He is aware of the plan to transition home today. Denies pain, shortness of breath, nausea. Educated on the need for for alcohol cessation.   Questions and concerns addressed/Palliative Support Provided.   Objective Assessment: Vital Signs Vitals:   10/10/23 0400 10/10/23 0800  BP:  123/67  Pulse:  79  Resp:  18  Temp: 97.9 F (36.6 C) (!) 97.3 F (36.3 C)  SpO2:  100%   No intake or output data in the 24 hours ending 10/10/23 1006  Last Weight  Most recent update: 09/30/2023  1:34 AM    Weight  68 kg (150 lb)            Gen:  Chronically ill appearing Caucasian M in NAD HEENT: moist mucous membranes CV: Regular rate and rhythm  PULM:  On RA, breathing is even and not labored ABD: soft/nontender  EXT: No edema  Neuro: Alert and oriented x3 - uses hand gestures and writes on paper to communicate as is not verbal  SUMMARY OF RECOMMENDATIONS   Full Code --> Patient does have a living will and his wife shares that he previously wanted to be a DNAR though now patient  has changed his mind to be full resuscitative efforts  Discharge to home this afternoon with OP Palliative support  Time: 25 ______________________________________________________________________________________ Rosaline Becton [place] Palliative Medicine Team Team Cell Phone: [phone] Please utilize secure chat with additional questions, if there is no response within 30 minutes please call the above phone number  Palliative Medicine Team providers are available by phone from 7am to 7pm daily and can be reached through the team cell phone.  Should this patient require assistance outside of these hours, please call the patient's attending physician.

## 2023-10-10 NOTE — Discharge Summary (Signed)
 PATIENT DETAILS Name: Adam [name] Age: 73 y.o. Sex: male Date of Birth: [birth year] MRN: [id number]. Admitting Physician: Isaiah Geralds, MD ERE:Almryzuuz, [name], MD  Admit Date: 09/30/2023 Discharge date: 10/10/2023  Recommendations for Outpatient Follow-up:  Follow up with PCP in 1-2 weeks Please obtain CMP/CBC in one week Annual CTA/MRA for ascending aortic aneurysm seen incidentally on CT imaging All hypertensives held-as blood pressure was soft-and then controlled without use of any antihypertensives.  Admitted From:  Home  Disposition: Home health   Discharge Condition: fair  CODE STATUS:   Code Status: Full Code   Diet recommendation:  Diet Order             Diet - low sodium heart healthy           Diet full liquid Room service appropriate? No; Fluid consistency: Thin  Diet effective now                    Brief Summary: Patient is a 73 y.o.  male with history of EtOH use, Warnicke's encephalopathy, pseudobulbar palsy, chronic aphasia, HTN, CAD s/p CABG, PAF, DM-2, COPD-who presented to the hospital following a mechanical fall-he was found to have right thigh hematoma with acute blood loss anemia-subsequently admitted to the hospitalist service for further evaluation and treatment.   Significant events: 12/27>> admit to [facility]   Significant studies: 12/27>> CXR: No PNA 12/27>> pelvic x-ray: No traumatic injury 12/27>> CT head: No acute intracranial abnormality. 12/27>> CT C-spine: No fracture 12/27>> CT chest/abdomen/pelvis: Soft tissue hematoma right hip, no acute injuries. 12/27>> x-ray left femur: No fracture. 12/27>> x-ray left knee: No fracture.   Significant microbiology data: None   Procedures: None   Consults: Palliative care  Brief Hospital Course: Acute blood loss anemia secondary to right thigh hematoma Managed with supportive care-did require 1 unit of PRBC on 12/27-none since then. Hb remains stable Follow CBC  periodically as an outpatient.   Mechanical fall at home in the setting of alcohol use History of frequent falls-due to pseudobulbar palsy/Warnicke's encephalopathy Extensive imaging studies negative for fracture Sutures from right cheek/staples from occipital area-removed 1/3 Initially spouse did not want to take him home-and wanted him to go to SNF-however I was informed on 1/5 by the palliative care team-that the spouse now wants to take him home.  Will arrange for home health services on discharge.   History of pseudobulbar palsy/Wernicke's encephalopathy/motor aphasia Aphasic at baseline-communicates with signs/writing. Follows with neurology in the outpatient setting   Dysphagia Chronic issue-related to pseudobulbar palsy/Wernicke's encephalopathy Has refused PEG tube in the past After discussion with patient/family-diet resumed with known aspiration risk. SLP followed closely   EtOH use Not sure if he has any intention of quitting Should be out of window for any withdrawal symptoms at this point   PAF Sinus rhythm Amiodarone  Beta-blocker held due to hypotension. Reviewed prior notes-numerous falls (right thigh hematoma requiring blood transfusion this mid)-ongoing alcohol use-agree with Dr. Shellie a good long-term candidate for anticoagulation. Spoke with spouse on 1/6-risk/benefits of anticoagulation discussed in detail-she is agreeable to continue to hold anticoagulation.  High likelihood that he is going to go home and continue to drink-continue to fall. He will be on aspirin  for now.   CAD s/p CABG 2011 No anginal symptoms   HLD Statin   HTN BP stable All antihypertensives on hold as BP soft. PCP to reassess and resume antihypertensives when able.   DM-2 (A1c 5.13 August 2023) CBG stable on  SSI Resume oral hypoglycemics on discharge  Cholelithiasis Incidental finding on CT imaging Outpatient follow-up with PCP/general surgery   4.3 cm ascending  aortic aneurysm Incidental finding on CT imaging-annual outpatient CTA/MRA recommended   Mood disorder Cymbalta    COPD/asthma Stable As needed bronchodilators   Palliative care Previously DNR-now full code Very poor underlying prognosis given his underlying neurological issues-ongoing alcohol use Palliative care following.   Nutrition Status: Nutrition Problem: Severe Malnutrition Etiology: chronic illness Signs/Symptoms: severe fat depletion, severe muscle depletion Interventions: Ensure Enlive (each supplement provides 350kcal and 20 grams of protein), MVI, Magic cup   BMI: Estimated body mass index is 21.52 kg/m as calculated from the following:   Height as of this encounter: 5' 10 (1.778 m).   Weight as of this encounter: 68 kg.  Discharge Diagnoses:  Principal Problem:   Acute on chronic anemia Active Problems:   Fall at home, initial encounter   Alcohol abuse   Hypokalemia   Dysphagia   PAF (paroxysmal atrial fibrillation) (HCC)   progressive neurologica disorder with pseudobulbar palsy, aphasia, wernicke encephalopathy with continued alcohol use   Essential hypertension   CAD (coronary artery disease)   Controlled type 2 diabetes mellitus without complication, without long-term current use of insulin  (HCC)   COPD with asthma (HCC)   Thoracic aortic aneurysm (HCC)   Hyperlipidemia   Depression   OSA (obstructive sleep apnea)   Wernicke encephalopathy   Protein-calorie malnutrition, severe   Discharge Instructions:  Activity:  As tolerated with Full fall precautions use walker/cane & assistance as needed  Discharge Instructions     Call MD for:  extreme fatigue   Complete by: As directed    Call MD for:  persistant dizziness or light-headedness   Complete by: As directed    Diet - low sodium heart healthy   Complete by: As directed    Discharge instructions   Complete by: As directed    Follow with Primary MD  [name], MD in 1-2  weeks  Please get a complete blood count and chemistry panel checked by your Primary MD at your next visit, and again as instructed by your Primary MD.  Get Medicines reviewed and adjusted: Please take all your medications with you for your next visit with your Primary MD  Laboratory/radiological data: Please request your Primary MD to go over all hospital tests and procedure/radiological results at the follow up, please ask your Primary MD to get all Hospital records sent to his/her office.  In some cases, they will be blood work, cultures and biopsy results pending at the time of your discharge. Please request that your primary care M.D. follows up on these results.  Also Note the following: If you experience worsening of your admission symptoms, develop shortness of breath, life threatening emergency, suicidal or homicidal thoughts you must seek medical attention immediately by calling 911 or calling your MD immediately  if symptoms less severe.  You must read complete instructions/literature along with all the possible adverse reactions/side effects for all the Medicines you take and that have been prescribed to you. Take any new Medicines after you have completely understood and accpet all the possible adverse reactions/side effects.   Do not drive when taking Pain medications or sleeping medications (Benzodaizepines)  Do not take more than prescribed Pain, Sleep and Anxiety Medications. It is not advisable to combine anxiety,sleep and pain medications without talking with your primary care practitioner  Special Instructions: If you have smoked or chewed Tobacco  in the last 2 yrs please stop smoking, stop any regular Alcohol  and or any Recreational drug use.  Wear Seat belts while driving.  Please note: You were cared for by a hospitalist during your hospital stay. Once you are discharged, your primary care physician will handle any further medical issues. Please note that NO REFILLS  for any discharge medications will be authorized once you are discharged, as it is imperative that you return to your primary care physician (or establish a relationship with a primary care physician if you do not have one) for your post hospital discharge needs so that they can reassess your need for medications and monitor your lab values.   Increase activity slowly   Complete by: As directed    No wound care   Complete by: As directed       Allergies as of 10/10/2023   No Known Allergies      Medication List     STOP taking these medications    Eliquis  5 MG Tabs tablet Generic drug: apixaban    lisinopril  20 MG tablet Commonly known as: ZESTRIL    metoprolol  tartrate 50 MG tablet Commonly known as: LOPRESSOR        TAKE these medications    acetaminophen  500 MG tablet Commonly known as: TYLENOL  Take 500 mg by mouth every 6 (six) hours as needed for mild pain (pain score 1-3) or moderate pain (pain score 4-6).   allopurinol  300 MG tablet Commonly known as: ZYLOPRIM  Take 300 mg by mouth daily.   amiodarone  200 MG tablet Commonly known as: PACERONE  Take 200 mg by mouth daily.   aspirin  EC 81 MG tablet Take 1 tablet (81 mg total) by mouth daily. Swallow whole.   cyanocobalamin  500 MCG tablet Commonly known as: VITAMIN B12 Take 500 mcg by mouth daily.   DULoxetine  60 MG capsule Commonly known as: CYMBALTA  Take 60 mg by mouth daily.   gabapentin  300 MG capsule Commonly known as: NEURONTIN  Take 600 mg by mouth at bedtime as needed.   ipratropium 0.03 % nasal spray Commonly known as: ATROVENT Place 2 sprays into both nostrils 3 (three) times daily.   Nuedexta  20-10 MG capsule Generic drug: Dextromethorphan -quiNIDine Take 1 capsule by mouth daily.   PreviDent 5000 Enamel Protect 1.1-5 % Gel Generic drug: Sod Fluoride-Potassium Nitrate USE TO BRUSH TEETH FOR 2 MINUTES TWICE DAILY THEN SPIT OUT AFTER. NO FOOD OR DRINK FOR 30 MINUTES AFTER   rosuvastatin  40 MG  tablet Commonly known as: CRESTOR  Take 40 mg by mouth daily.   Skyrizi Pen 150 MG/ML pen Generic drug: risankizumab-rzaa Inject into the skin.   Synjardy  XR 25-1000 MG Tb24 Generic drug: Empagliflozin -metFORMIN  HCl ER Take 1 tablet by mouth daily.        Follow-up Information     [name], [name], MD. Schedule an appointment as soon as possible for a visit in 1 week(s).   Specialty: Family Medicine Contact information: [address] [place] [address] [phone]                No Known Allergies   Other Procedures/Studies: DG Femur Min 2 Views Left Result Date: 09/30/2023 CLINICAL DATA:  Level 1 trauma patient, fall injury. EXAM: LEFT FEMUR 2 VIEWS; LEFT KNEE - COMPLETE 4+ VIEW COMPARISON:  Left femoral and left knee series from 3 days ago 09/27/2023 FINDINGS: AP and lateral left femur: Left hip arthroplasty with cemented femoral component without visible evidence of loosening. There is osteopenia without evidence for  fractures or other focal bone lesions. No focal soft tissue swelling is seen. There is artifact from overlying clothing. There is contrast in the bladder and the distal left ureter. There is heavy vascular calcification. Left knee, four views: Small suprapatellar bursal effusion is unchanged. There is osteopenia without evidence of fracture or dislocation. Tricompartmental joint narrowing and osteophytosis is seen, greatest narrowing in the medial femorotibial joint Superficial soft tissues are unremarkable. The popliteal artery and trifurcation arteries are heavily calcified. IMPRESSION: 1. No evidence of fractures or other new findings in the left femur or left knee. Small suprapatellar bursal effusion is unchanged. 2. Osteopenia and degenerative change. 3. Heavy vascular calcification. Electronically Signed   By: Francis Quam M.D.   On: 09/30/2023 02:45   DG Knee Complete 4 Views Left Result Date: 09/30/2023 CLINICAL DATA:  Level 1 trauma  patient, fall injury. EXAM: LEFT FEMUR 2 VIEWS; LEFT KNEE - COMPLETE 4+ VIEW COMPARISON:  Left femoral and left knee series from 3 days ago 09/27/2023 FINDINGS: AP and lateral left femur: Left hip arthroplasty with cemented femoral component without visible evidence of loosening. There is osteopenia without evidence for fractures or other focal bone lesions. No focal soft tissue swelling is seen. There is artifact from overlying clothing. There is contrast in the bladder and the distal left ureter. There is heavy vascular calcification. Left knee, four views: Small suprapatellar bursal effusion is unchanged. There is osteopenia without evidence of fracture or dislocation. Tricompartmental joint narrowing and osteophytosis is seen, greatest narrowing in the medial femorotibial joint Superficial soft tissues are unremarkable. The popliteal artery and trifurcation arteries are heavily calcified. IMPRESSION: 1. No evidence of fractures or other new findings in the left femur or left knee. Small suprapatellar bursal effusion is unchanged. 2. Osteopenia and degenerative change. 3. Heavy vascular calcification. Electronically Signed   By: Francis Quam M.D.   On: 09/30/2023 02:45   CT CHEST ABDOMEN PELVIS W CONTRAST Result Date: 09/30/2023 CLINICAL DATA:  Polytrauma, blunt EXAM: CT CHEST, ABDOMEN, AND PELVIS WITH CONTRAST TECHNIQUE: Multidetector CT imaging of the chest, abdomen and pelvis was performed following the standard protocol during bolus administration of intravenous contrast. RADIATION DOSE REDUCTION: This exam was performed according to the departmental dose-optimization program which includes automated exposure control, adjustment of the mA and/or kV according to patient size and/or use of iterative reconstruction technique. CONTRAST:  75mL OMNIPAQUE  IOHEXOL  350 MG/ML SOLN COMPARISON:  CT angio chest 06/17/2022, ultrasound renal 02/12/2023, x-ray pelvis 09/30/2023 FINDINGS: CHEST: Cardiovascular: No  aortic injury. 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 Four-vessel coronary calcification. Status post coronary bypass graft and stents. The heart is normal in size. No significant pericardial effusion. Mediastinum/Nodes: No pneumomediastinum. No mediastinal hematoma. The esophagus is unremarkable. The thyroid  is unremarkable. The central airways are patent. No mediastinal, hilar, or axillary lymphadenopathy. Lungs/Pleura: No focal consolidation. No pulmonary nodule. No pulmonary mass. No pulmonary contusion or laceration. No pneumatocele formation. Chronic right rib fractures. No pleural effusion. No pneumothorax. No hemothorax. Musculoskeletal/Chest wall: No chest wall mass. No acute rib or sternal fracture. No spinal fracture. Multilevel degenerative changes of the spine. Multiple chronically fractured sternotomy wires. ABDOMEN / PELVIS: Hepatobiliary: Not enlarged. No focal lesion. No laceration or subcapsular hematoma. Calcified gallstones within the gallbladder lumen. No gallbladder wall thickening or pericholecystic fluid. No biliary ductal dilatation. Pancreas: Normal pancreatic contour. No main pancreatic duct dilatation. Spleen: Not enlarged. No focal lesion. No laceration, subcapsular hematoma, or vascular injury. Adrenals/Urinary Tract: No nodularity bilaterally.  Bilateral kidneys enhance symmetrically. No hydronephrosis. No contusion, laceration, or subcapsular hematoma. No injury to the vascular structures or collecting systems. No hydroureter. The urinary bladder is unremarkable. Stomach/Bowel: No small or large bowel wall thickening or dilatation. The appendix is not definitely identified with no inflammatory changes in the right lower quadrant to suggest acute appendicitis. Vasculature/Lymphatics: No abdominal aorta or iliac aneurysm. No active contrast extravasation or pseudoaneurysm. No abdominal, pelvic, inguinal lymphadenopathy. Reproductive: Normal. Other: No simple  free fluid ascites. No pneumoperitoneum. No hemoperitoneum. No mesenteric hematoma identified. No organized fluid collection. Musculoskeletal: Right hip subcutaneus soft tissue hematoma formation. No acute pelvic fracture. No spinal fracture. Multilevel severe degenerative changes of the spine. 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 Partially visualized total bilateral hip arthroplasty. Ports and Devices: None. IMPRESSION: 1. Right hip subcutaneus soft tissue hematoma formation. 2. No acute intrathoracic, intra-abdominal, intrapelvic traumatic injury. 3. No acute fracture or traumatic malalignment of the thoracic or lumbar spine. Other imaging findings of potential clinical significance: 1. Cholelithiasis with no CT evidence of acute cholecystitis. 2. Stable ascending thoracic aorta (4.3 cm). Recommend annual imaging followup by CTA or MRA. This recommendation follows 2010 ACCF/AHA/AATS/ACR/ASA/SCA/SCAI/SIR/STS/SVM Guidelines for the Diagnosis and Management of Patients with Thoracic Aortic Disease. Circulation. 2010; 121: Z733-z630. Aortic aneurysm NOS (ICD10-I71.9). These results were called by telephone at the time of interpretation on 09/30/2023 at 1:45 am to provider Dr. Teresa, Who verbally acknowledged these results. Electronically Signed   By: Morgane  Naveau M.D.   On: 09/30/2023 02:00   CT HEAD WO CONTRAST Result Date: 09/30/2023 CLINICAL DATA:  Head trauma, moderate-severe; Polytrauma, blunt EXAM: CT HEAD WITHOUT CONTRAST CT CERVICAL SPINE WITHOUT CONTRAST TECHNIQUE: Multidetector CT imaging of the head and cervical spine was performed following the standard protocol without intravenous contrast. Multiplanar CT image reconstructions of the cervical spine were also generated. RADIATION DOSE REDUCTION: This exam was performed according to the departmental dose-optimization program which includes automated exposure control, adjustment of the mA  and/or kV according to patient size and/or use of iterative reconstruction technique. COMPARISON:  CT head and C-spine 09/27/2023 FINDINGS: CT HEAD FINDINGS Brain: Stable prominence of the lateral ventricles may be related to central predominant atrophy, although a component of normal pressure/communicating hydrocephalus cannot be excluded. Patchy and confluent areas of decreased attenuation are noted throughout the deep and periventricular white matter of the cerebral hemispheres bilaterally, compatible with chronic microvascular ischemic disease. No evidence of large-territorial acute infarction. No parenchymal hemorrhage. No mass lesion. No extra-axial collection. No mass effect or midline shift. No hydrocephalus. Basilar cisterns are patent. Vascular: No hyperdense vessel. Skull: No acute fracture or focal lesion. Sinuses/Orbits: Left maxillary sinus mucosal thickening. Paranasal sinuses and mastoid air cells are clear. The orbits are unremarkable. Other: None. CT CERVICAL SPINE FINDINGS Alignment: Normal. Skull base and vertebrae: Multilevel degenerative changes spine most prominent at the C5 through C7 levels. No associated severe osseous neural foraminal or central canal stenosis. No acute fracture. No aggressive appearing focal osseous lesion or focal pathologic process. Soft tissues and spinal canal: No prevertebral fluid or swelling. No visible canal hematoma. Upper chest: Unremarkable. Other: Atherosclerotic plaque of the carotid arteries within the neck. IMPRESSION: 1. No acute intracranial abnormality. 2. No acute displaced fracture or traumatic listhesis of the cervical spine. 3. Stable prominence of the lateral ventricles may be related to central predominant atrophy, although a component of normal pressure/communicating hydrocephalus cannot be excluded. These results were called by telephone at  the time of interpretation on 09/30/2023 at 1:45 am to provider Dr. Teresa, Who verbally acknowledged these  results. Electronically Signed   By: Morgane  Naveau M.D.   On: 09/30/2023 01:53   CT CERVICAL SPINE WO CONTRAST Result Date: 09/30/2023 CLINICAL DATA:  Head trauma, moderate-severe; Polytrauma, blunt EXAM: CT HEAD WITHOUT CONTRAST CT CERVICAL SPINE WITHOUT CONTRAST TECHNIQUE: Multidetector CT imaging of the head and cervical spine was performed following the standard protocol without intravenous contrast. Multiplanar CT image reconstructions of the cervical spine were also generated. RADIATION DOSE REDUCTION: This exam was performed according to the departmental dose-optimization program which includes automated exposure control, adjustment of the mA and/or kV according to patient size and/or use of iterative reconstruction technique. COMPARISON:  CT head and C-spine 09/27/2023 FINDINGS: CT HEAD FINDINGS Brain: Stable prominence of the lateral ventricles may be related to central predominant atrophy, although a component of normal pressure/communicating hydrocephalus cannot be excluded. Patchy and confluent areas of decreased attenuation are noted throughout the deep and periventricular white matter of the cerebral hemispheres bilaterally, compatible with chronic microvascular ischemic disease. No evidence of large-territorial acute infarction. No parenchymal hemorrhage. No mass lesion. No extra-axial collection. No mass effect or midline shift. No hydrocephalus. Basilar cisterns are patent. Vascular: No hyperdense vessel. Skull: No acute fracture or focal lesion. Sinuses/Orbits: Left maxillary sinus mucosal thickening. Paranasal sinuses and mastoid air cells are clear. The orbits are unremarkable. Other: None. CT CERVICAL SPINE FINDINGS Alignment: Normal. Skull base and vertebrae: Multilevel degenerative changes spine most prominent at the C5 through C7 levels. No associated severe osseous neural foraminal or central canal stenosis. No acute fracture. No aggressive appearing focal osseous lesion or focal  pathologic process. Soft tissues and spinal canal: No prevertebral fluid or swelling. No visible canal hematoma. Upper chest: Unremarkable. Other: Atherosclerotic plaque of the carotid arteries within the neck. IMPRESSION: 1. No acute intracranial abnormality. 2. No acute displaced fracture or traumatic listhesis of the cervical spine. 3. Stable prominence of the lateral ventricles may be related to central predominant atrophy, although a component of normal pressure/communicating hydrocephalus cannot be excluded. These results were called by telephone at the time of interpretation on 09/30/2023 at 1:45 am to provider Dr. Teresa, Who verbally acknowledged these results. Electronically Signed   By: Morgane  Naveau M.D.   On: 09/30/2023 01:53   DG Pelvis Portable Result Date: 09/30/2023 CLINICAL DATA:  Trauma EXAM: PORTABLE PELVIS 1-2 VIEWS COMPARISON:  CT abdomen pelvis 09/30/2023, x-ray left femur 09/27/2023 FINDINGS: Bilateral total hip arthroplasty partially visualized. No radiographic findings suggest surgical hardware complication. There is no evidence of pelvic fracture or diastasis. No pelvic bone lesions are seen. Vascular calcification. IMPRESSION: Negative for acute traumatic injury. Electronically Signed   By: Morgane  Naveau M.D.   On: 09/30/2023 01:49   DG Chest Port 1 View Result Date: 09/30/2023 CLINICAL DATA:  Trauma EXAM: PORTABLE CHEST 1 VIEW COMPARISON:  CT chest 09/30/2023. chest x-ray 09/27/2023 FINDINGS: Wireless cardiac device overlies the left chest. Coronary artery stents. The heart and mediastinal contours are unchanged. No focal consolidation. No pulmonary edema. No pleural effusion. No pneumothorax. No acute osseous abnormality.  Chronic fractured sternotomy wires. IMPRESSION: No active disease. These results were called by telephone at the time of interpretation on 09/30/2023 at 1:45 am to provider Dr. Teresa, Who verbally acknowledged these results. Electronically Signed   By:  Morgane  Naveau M.D.   On: 09/30/2023 01:49     TODAY-DAY OF DISCHARGE:  Subjective:   Malaquias Golaszewski today  has no headache,no chest abdominal pain,no new weakness tingling or numbness, feels much better wants to go home today.   Objective:   Blood pressure 123/67, pulse 79, temperature (!) 97.3 F (36.3 C), temperature source Axillary, resp. rate 18, height 5' 10 (1.778 m), weight 68 kg, SpO2 100%. No intake or output data in the 24 hours ending 10/10/23 0859 Filed Weights   09/30/23 0134  Weight: 68 kg    Exam: Awake Alert, Oriented *3, No new F.N deficits, Normal affect [place].AT,PERRAL Supple Neck,No JVD, No cervical lymphadenopathy appriciated.  Symmetrical Chest wall movement, Good air movement bilaterally, CTAB RRR,No Gallops,Rubs or new Murmurs, No Parasternal Heave +ve B.Sounds, Abd Soft, Non tender, No organomegaly appriciated, No rebound -guarding or rigidity. No Cyanosis, Clubbing or edema, No new Rash or bruise   PERTINENT RADIOLOGIC STUDIES: No results found.   PERTINENT LAB RESULTS: CBC: No results for input(s): WBC, HGB, HCT, PLT in the last 72 hours. CMET CMP     Component Value Date/Time   NA 137 10/05/2023 0514   K 3.5 10/05/2023 0514   CL 106 10/05/2023 0514   CO2 24 10/05/2023 0514   GLUCOSE 110 (H) 10/05/2023 0514   BUN 8 10/05/2023 0514   CREATININE 0.65 10/05/2023 0514   CALCIUM  8.8 (L) 10/05/2023 0514   PROT 5.8 (L) 09/30/2023 0124   ALBUMIN 3.1 (L) 09/30/2023 0124   AST 49 (H) 09/30/2023 0124   ALT 30 09/30/2023 0124   ALKPHOS 40 09/30/2023 0124   BILITOT 1.0 09/30/2023 0124   GFRNONAA >60 10/05/2023 0514    GFR Estimated Creatinine Clearance: 79.1 mL/min (by C-G formula based on SCr of 0.65 mg/dL). No results for input(s): LIPASE, AMYLASE in the last 72 hours. No results for input(s): CKTOTAL, CKMB, CKMBINDEX, TROPONINI in the last 72 hours. Invalid input(s): POCBNP No results for input(s): DDIMER in the  last 72 hours. No results for input(s): HGBA1C in the last 72 hours. No results for input(s): CHOL, HDL, LDLCALC, TRIG, CHOLHDL, LDLDIRECT in the last 72 hours. No results for input(s): TSH, T4TOTAL, T3FREE, THYROIDAB in the last 72 hours.  Invalid input(s): FREET3 No results for input(s): VITAMINB12, FOLATE, FERRITIN, TIBC, IRON, RETICCTPCT in the last 72 hours. Coags: No results for input(s): INR in the last 72 hours.  Invalid input(s): PT Microbiology: No results found for this or any previous visit (from the past 240 hours).  FURTHER DISCHARGE INSTRUCTIONS:  Get Medicines reviewed and adjusted: Please take all your medications with you for your next visit with your Primary MD  Laboratory/radiological data: Please request your Primary MD to go over all hospital tests and procedure/radiological results at the follow up, please ask your Primary MD to get all Hospital records sent to his/her office.  In some cases, they will be blood work, cultures and biopsy results pending at the time of your discharge. Please request that your primary care M.D. goes through all the records of your hospital data and follows up on these results.  Also Note the following: If you experience worsening of your admission symptoms, develop shortness of breath, life threatening emergency, suicidal or homicidal thoughts you must seek medical attention immediately by calling 911 or calling your MD immediately  if symptoms less severe.  You must read complete instructions/literature along with all the possible adverse reactions/side effects for all the Medicines you take and that have been prescribed to you. Take any new Medicines after you have completely understood and accpet all the possible adverse reactions/side effects.  Do not drive when taking Pain medications or sleeping medications (Benzodaizepines)  Do not take more than prescribed Pain, Sleep and Anxiety  Medications. It is not advisable to combine anxiety,sleep and pain medications without talking with your primary care practitioner  Special Instructions: If you have smoked or chewed Tobacco  in the last 2 yrs please stop smoking, stop any regular Alcohol  and or any Recreational drug use.  Wear Seat belts while driving.  Please note: You were cared for by a hospitalist during your hospital stay. Once you are discharged, your primary care physician will handle any further medical issues. Please note that NO REFILLS for any discharge medications will be authorized once you are discharged, as it is imperative that you return to your primary care physician (or establish a relationship with a primary care physician if you do not have one) for your post hospital discharge needs so that they can reassess your need for medications and monitor your lab values.  Total Time spent coordinating discharge including counseling, education and face to face time equals greater than 30 minutes.  SignedBETHA Donalda Applebaum 10/10/2023 8:59 AM

## 2023-10-10 NOTE — Progress Notes (Signed)
 Occupational Therapy Treatment Patient Details Name: Adam [name] MRN: [id number] DOB: 1950/11/06 Today's Date: 10/10/2023   History of present illness 73 y.o. male presents to [facility] hospital on 09/30/2023 after a fall at home with head injury. Pt noted to be hypotensive, was consuming alcohol. PMH includes PAF on eliquis , HTN, HLD, CAD, DMII, thoracic AA, alcohol abuse, wernicke encephalopathy.   OT comments  Pt demonstrating need for assist with bed mobility, becoming agitated when prompting pt that he needs to make efforts to complete it himself. Once at EOB, pt able to complete ADLs with setup to mod I level and increased time. Pt overall CGA for ambulation with RW. OT to continue following pt acutely to progress as able. Pt/spouse seems to have outpatient palliative and not pursued any [facility] Services. No post acute OT recommended.       If plan is discharge home, recommend the following:  Assist for transportation   Equipment Recommendations  None recommended by OT    Recommendations for Other Services      Precautions / Restrictions Precautions Precautions: Fall Restrictions Weight Bearing Restrictions Per Provider Order: No       Mobility Bed Mobility Overal bed mobility: Needs Assistance Bed Mobility: Sit to Supine, Sidelying to Sit   Sidelying to sit: HOB elevated, Used rails, Mod assist       General bed mobility comments: Mod A for trunk elevation to sit to EOB    Transfers Overall transfer level: Needs assistance Equipment used: Rolling walker (2 wheels) Transfers: Sit to/from Stand Sit to Stand: Contact guard assist           General transfer comment: STS from EOB     Balance Overall balance assessment: Needs assistance Sitting-balance support: No upper extremity supported, Feet supported Sitting balance-Leahy Scale: Good     Standing balance support: Bilateral upper extremity supported, Reliant on assistive device for balance, During functional  activity Standing balance-Leahy Scale: Poor Standing balance comment: with RW support                           ADL either performed or assessed with clinical judgement   ADL Overall ADL's : Needs assistance/impaired Eating/Feeding: Independent;Sitting   Grooming: Standing;Contact guard assist   Upper Body Bathing: Sitting;Set up   Lower Body Bathing: Set up;Sitting/lateral leans   Upper Body Dressing : Sitting;Modified independent   Lower Body Dressing: Supervision/safety;Set up;Sitting/lateral leans Lower Body Dressing Details (indicate cue type and reason): significantly increased time needed to don socks and shoes Toilet Transfer: Rolling walker (2 wheels);Contact guard assist   Toileting- Clothing Manipulation and Hygiene: Sit to/from stand;Contact guard assist       Functional mobility during ADLs: Contact guard assist;Rolling walker (2 wheels)      Extremity/Trunk Assessment              Vision       Perception     Praxis      Cognition Arousal: Alert Behavior During Therapy: Agitated                                   General Comments: Pt agitated with efforts to have him complete bed mobility independently. Follows commands well        Exercises      Shoulder Instructions       General Comments VSS per monitor    Pertinent  Vitals/ Pain       Pain Assessment Pain Assessment: Faces Faces Pain Scale: No hurt  Home Living                                          Prior Functioning/Environment              Frequency  Min 1X/week        Progress Toward Goals  OT Goals(current goals can now be found in the care plan section)  Progress towards OT goals: Progressing toward goals  Acute Rehab OT Goals OT Goal Formulation: With patient Time For Goal Achievement: 10/17/23 Potential to Achieve Goals: Fair  Plan      Co-evaluation                 AM-PAC OT 6 Clicks Daily  Activity     Outcome Measure   Help from another person eating meals?: None Help from another person taking care of personal grooming?: A Little Help from another person toileting, which includes using toliet, bedpan, or urinal?: A Little Help from another person bathing (including washing, rinsing, drying)?: A Little Help from another person to put on and taking off regular upper body clothing?: None Help from another person to put on and taking off regular lower body clothing?: A Little 6 Click Score: 20    End of Session Equipment Utilized During Treatment: Rolling walker (2 wheels);Gait belt  OT Visit Diagnosis: Unsteadiness on feet (R26.81)   Activity Tolerance Patient tolerated treatment well   Patient Left in chair;with call bell/phone within reach;with chair alarm set   Nurse Communication Mobility status        Time: 9075-9052 OT Time Calculation (min): 23 min  Charges: OT General Charges $OT Visit: 1 Visit OT Treatments $Self Care/Home Management : 8-22 mins $Therapeutic Activity: 8-22 mins  10/10/2023  AB, OTR/L  Acute Rehabilitation Services  Office: [phone]   Curtistine JONETTA Das 10/10/2023, 12:35 PM

## 2023-10-11 ENCOUNTER — Telehealth: Payer: Self-pay

## 2023-10-11 NOTE — Transitions of Care (Post Inpatient/ED Visit) (Addendum)
 10/11/2023  Name: Adam [name] MRN: [id number] DOB: 20-Jan-1951  Today's TOC FU Call Status: Today's TOC FU Call Status:: Successful TOC FU Call Completed TOC FU Call Complete Date: 10/11/23 Patient's Name and Date of Birth confirmed.  Transition Care Management Follow-up Telephone Call Date of Discharge: 10/10/23 Discharge Facility: [name]) Type of Discharge: Inpatient Admission Primary Inpatient Discharge Diagnosis:: Acute on Chronic Anemia How have you been since you were released from the hospital?: Same (Patients spouse notes patient had another fall about 4 hours after he discharged home) Any questions or concerns?: No  Items Reviewed: Did you receive and understand the discharge instructions provided?: Yes Medications obtained,verified, and reconciled?: Partial Review Completed Any new allergies since your discharge?: No Dietary orders reviewed?: No Do you have support at home?: Yes People in Home: spouse Name of Support/Comfort Primary Source: Adam [name]  Medications Reviewed Today: Medications Reviewed Today     Reviewed by Adam Sober, RN (Case Manager) on 10/11/23 at 1144  Med List Status: <None>   Medication Order Taking? Sig Documenting Provider Last Dose Status Informant  acetaminophen  (TYLENOL ) 500 MG tablet [id number] Yes Take 500 mg by mouth every 6 (six) hours as needed for mild pain (pain score 1-3) or moderate pain (pain score 4-6). [provider] Taking Active Self, Pharmacy Records  allopurinol  (ZYLOPRIM ) 300 MG tablet [id number] Yes TAKE 1 TABLET(300 MG) BY MOUTH DAILY Adam [name], Adam [name] ORN, MD Taking Active   allopurinol  (ZYLOPRIM ) 300 MG tablet [id number] Yes Take 300 mg by mouth daily. [provider] Taking Active Self, Pharmacy Records  amiodarone  (PACERONE ) 200 MG tablet [id number] Yes TAKE 1 TABLET(200 MG) BY MOUTH DAILY Adam [name], Adam R, PA Taking Active   amiodarone  (PACERONE ) 200 MG tablet [id number] No Take 200 mg by mouth  daily. [provider] Unknown Active Self, Pharmacy Records  apixaban  (ELIQUIS ) 5 MG TABS tablet [id number] No TAKE 1 TABLET(5 MG) BY MOUTH TWICE DAILY  Patient not taking: Reported on 10/11/2023   Adam [name], Adam M, MD Not Taking Active            Med Note Adam [name], [name]   Tue Oct 11, 2023 11:40 AM) Medication was stopped on dc from acute  aspirin  EC 81 MG tablet [id number] Yes Take 1 tablet (81 mg total) by mouth daily. Swallow whole. Adam [name] HERO, MD Taking Active   cyanocobalamin  (VITAMIN B12) 500 MCG tablet [id number] Yes Take 500 mcg by mouth daily. [provider] Taking Active Self, Pharmacy Records  Dextromethorphan -quiNIDine (NUEDEXTA ) 20-10 MG capsule [id number] Yes Take 1 capsule by mouth daily. Adam Lek, MD Taking Active   DULoxetine  (CYMBALTA ) 60 MG capsule [id number] Yes Take one capsule by mouth once daily Adam [name], Adam [name] ORN, MD Taking Active   DULoxetine  (CYMBALTA ) 60 MG capsule [id number] No Take 60 mg by mouth daily. [provider] Unknown Active Self, Pharmacy Records  gabapentin  (NEURONTIN ) 300 MG capsule [id number] Yes TAKE 2 CAPSULES BY MOUTH AT NIGHT AS NEEDED FOR RESTLESS LEGS OR SYMPTOMS Adam [name], Adam [name] ORN, MD Taking Active   gabapentin  (NEURONTIN ) 300 MG capsule [id number] Yes Take 600 mg by mouth at bedtime as needed. [provider] Taking Active Self, Pharmacy Records  HM LIDOCAINE  Adam [name] EX [id number] No Apply 1 patch topically as needed (pain). [provider] Unknown Active Self  ipratropium (ATROVENT) 0.03 % nasal spray [id number] Yes Place 2 sprays into both nostrils See admin instructions. Instill 2 sprays into each nostril 2-3 times a day. [provider] Taking Active  ipratropium (ATROVENT) 0.03 % nasal spray [id number]  Place 2 sprays into both nostrils 3 (three) times daily. [provider]  Active Self, Pharmacy Records  lisinopril  (ZESTRIL ) 20 MG tablet [id number] No TAKE 1 TABLET(20 MG) BY MOUTH  DAILY  Patient not taking: Reported on 10/11/2023   Adam Adam [name] ORN, MD Not Taking Active            Med Note Adam [name], Adam [name]   Tue Oct 11, 2023 11:41 AM) Stopped upon discharge  Magnesium  Oxide 400 MG CAPS [id number] Yes Take 1 capsule (400 mg total) by mouth daily. Adam Adam [name] ORN, MD Taking Active   metoprolol  tartrate (LOPRESSOR ) 50 MG tablet [id number] No TAKE 1 TABLET(50 MG) BY MOUTH TWICE DAILY  Patient not taking: Reported on 10/11/2023   Adam Kenney NOVAK, Adam [name] Not Taking Active            Med Note Adam [name], [name]   Tue Oct 11, 2023 11:42 AM) Stopped upon dc from acute  montelukast  (SINGULAIR ) 10 MG tablet [id number] Yes Take 1 tablet (10 mg total) by mouth at bedtime. Adam Adam [name] ORN, MD Taking Active   NUEDEXTA  20-10 MG capsule [id number] Yes Take 1 capsule by mouth daily. [provider] Taking Active Self, Pharmacy Records  PREVIDENT 5000 ENAMEL PROTECT 1.1-5 % GEL [id number] No Take by mouth. [provider] Unknown Active   PREVIDENT 5000 ENAMEL PROTECT 1.1-5 % GEL [id number] No USE TO BRUSH TEETH FOR 2 MINUTES TWICE DAILY THEN SPIT OUT AFTER. NO FOOD OR DRINK FOR 30 MINUTES AFTER [provider] Unknown Active Self, Pharmacy Records  rosuvastatin  (CRESTOR ) 40 MG tablet [id number] Yes TAKE 1 TABLET(40 MG) BY MOUTH DAILY Adam [name], Adam M, MD Taking Active   rosuvastatin  (CRESTOR ) 40 MG tablet [id number] Yes Take 40 mg by mouth daily. [provider] Taking Active Self, Pharmacy Records  SKYRIZI PEN 150 MG/ML pen [id number]  Inject into the skin. [provider]  Active Self, Pharmacy Records  SKYRIZI PEN 150 MG/ML Adam [name] [id number] Yes Inject into the skin. Takes every 3 months [provider] Taking Active   sodium chloride  (OCEAN) 0.65 % SOLN nasal spray [id number] No Place 1 spray into both nostrils as needed for congestion. [provider] Unknown Active   Spacer/Aero-Holding Raguel FRENCH [id number] Yes Use with inhaler Malachy Comer GAILS, NP Taking Active   SYNJARDY  XR 25-1000 MG TB24 [id number] Yes TAKE 1 TABLET BY MOUTH DAILY Adam [name], Adam [name] ORN, MD Taking Active   SYNJARDY  XR 25-1000 MG TB24 [id number] No Take 1 tablet by mouth daily. [provider] Unknown Active Self, Pharmacy Records  thiamine  (VITAMIN B-1) 100 MG tablet [id number] Yes Take 500 mg by mouth daily. [provider] Taking Active Self  triamcinolone cream (KENALOG) 0.1 % [id number] No SMARTSIG:1 Application Topical 2-3 Times Daily [provider] Unknown Active             Home Care and Equipment/Supplies: Were Home Health Services Ordered?: No Any new equipment or medical supplies ordered?: No  Functional Questionnaire: Do you need assistance with bathing/showering or dressing?: Yes Do you need assistance with meal preparation?: Yes Do you need assistance with eating?: No Do you have difficulty maintaining continence: No Do you need assistance with getting out of bed/getting out of a chair/moving?: Yes Do you have difficulty managing or taking your medications?: No  Follow up appointments reviewed: PCP Follow-up appointment confirmed?: Yes MD Provider Line Number:[phone] Given:  (Patients spouse prefers to call for appointment) Date of PCP  follow-up appointment?: 10/18/23 Follow-up Provider: Wolm Scarlet, MD Specialist [facility] Follow-up appointment confirmed?: NA Do you need transportation to your follow-up appointment?: No Do you understand care options if your condition(s) worsen?: Yes-patient verbalized understanding  Barnie Gowda RN, BSN, CCM RN Care Manager  Transitions of Care  [facility]  [phone]

## 2023-10-13 ENCOUNTER — Ambulatory Visit: Payer: Self-pay | Admitting: Family Medicine

## 2023-10-13 NOTE — Telephone Encounter (Signed)
 I spoke with the patient's wife and informed her of the message below. Patient's wife declined appt tomorrow due to inclement weather concerns and stated she will follow up with PCP on next scheduled appt

## 2023-10-13 NOTE — Telephone Encounter (Signed)
 Copied from CRM [id number]. Topic: Clinical - Red Word Triage >> Oct 13, 2023  1:34 PM Joen NOVAK wrote: [facility] that prompted transfer to Nurse Triage: PATIENT WIFE CALLED STATING MR Adam [name] CAME FROM THE HOSPITAL ON MONDAY AND HAS FALLEN TWICE SINCE HE HAS BEEN HOME. PATIENT WIFE STATES THEIR ARE NO INJURIES AT THIS TIME NO PAIN JUST THE FALLING   Chief Complaint: generalized weakness Symptoms: fatigue Frequency: spouse refused to answer Pertinent Negatives: Spouse refused to answer Disposition: [] ED /[] Urgent Care (no appt availability in office) / [] Appointment(In office/virtual)/ []  [facility]/ [] Home Care/ [] Refused Recommended Disposition /[]  Mobile Bus/ []  Follow-up with PCP Additional Notes: Disposition not chosen as spouse refused triage of patient.  The patient's wife reported that he received a transfusion in the hospital two weeks ago but he has been weak and has fallen multiple times due to weakness.  She believes he is anemic.  She reported that he is unable to walk.  Attempted to gather more information regarding the onset of weakness, and the patient's inability to walk but the patient's wife refused to answer further questions.  She requested to speak to Adam [name] or to his nurse.  Attempted to call the CAL but there was no answer.  Message routed to pcp to further evaluate.    Reason for Disposition  [1] Caller demands to speak with the PCP AND [2] about sick adult (or sick caller)  Protocols used: Difficult Call-A-AH

## 2023-10-13 NOTE — Telephone Encounter (Addendum)
 I spoke with he patient's wife and she reported the patient has been having severe fatigue and weakness, x3 days. [name] stated that the patient has fallen 2x in the past 3 days since returning from the hospital on 10/10/2023. I advised [name] that the patient may need evaluation at ED but patient refused to proceed to ED. Patient's wife is looking for guidance on what she should do moving forward? Patient has appt with PCP on 10/18/2023

## 2023-10-17 ENCOUNTER — Telehealth: Payer: Self-pay

## 2023-10-17 NOTE — Telephone Encounter (Signed)
 Copied from CRM [id number]. Topic: General - Other >> Oct 17, 2023  2:17 PM [name] ORN wrote: Reason for CRM: Higher ups stated that I cannot do telephone encounter for this ( my apologies) PJ a RN with [facility]- [phone]  stated that she is calling to give a heads up and wants to speak with Dr. Micheal or one of his clinical staff in regards to the PT. He had his initial palliative care evaluation recently- PJ stated the Pt is a great candidate for hospice care as he is nonverbal, only sipping fluids, and is sleeping 90% of the time.  When speaking with the pts wife, she is needing guidance and clear transparency before she consents to hospice care. Pts wife stated that they told her he was in end of life but did not specifically state that he was dying. So she is confused.

## 2023-10-17 NOTE — Telephone Encounter (Signed)
 Noted.

## 2023-10-18 ENCOUNTER — Ambulatory Visit: Payer: Medicare Other | Admitting: Family Medicine

## 2023-10-18 ENCOUNTER — Encounter: Payer: Self-pay | Admitting: Family Medicine

## 2023-10-18 VITALS — BP 132/84 | HR 108 | Temp 98.6°F | Ht 70.0 in | Wt 154.2 lb

## 2023-10-18 DIAGNOSIS — E512 Wernicke's encephalopathy: Secondary | ICD-10-CM

## 2023-10-18 DIAGNOSIS — I480 Paroxysmal atrial fibrillation: Secondary | ICD-10-CM

## 2023-10-18 DIAGNOSIS — R634 Abnormal weight loss: Secondary | ICD-10-CM

## 2023-10-18 DIAGNOSIS — I10 Essential (primary) hypertension: Secondary | ICD-10-CM

## 2023-10-18 DIAGNOSIS — E43 Unspecified severe protein-calorie malnutrition: Secondary | ICD-10-CM

## 2023-10-18 DIAGNOSIS — D649 Anemia, unspecified: Secondary | ICD-10-CM

## 2023-10-18 DIAGNOSIS — F1010 Alcohol abuse, uncomplicated: Secondary | ICD-10-CM

## 2023-10-18 DIAGNOSIS — G1229 Other motor neuron disease: Secondary | ICD-10-CM

## 2023-10-18 LAB — CBC WITH DIFFERENTIAL/PLATELET
Basophils Absolute: 0 10*3/uL (ref 0.0–0.1)
Basophils Relative: 0.6 % (ref 0.0–3.0)
Eosinophils Absolute: 0 10*3/uL (ref 0.0–0.7)
Eosinophils Relative: 0.2 % (ref 0.0–5.0)
HCT: 39.5 % (ref 39.0–52.0)
Hemoglobin: 13.1 g/dL (ref 13.0–17.0)
Lymphocytes Relative: 13.4 % (ref 12.0–46.0)
Lymphs Abs: 0.8 10*3/uL (ref 0.7–4.0)
MCHC: 33.2 g/dL (ref 30.0–36.0)
MCV: 100.6 fL — ABNORMAL HIGH (ref 78.0–100.0)
Monocytes Absolute: 0.7 10*3/uL (ref 0.1–1.0)
Monocytes Relative: 11 % (ref 3.0–12.0)
Neutro Abs: 4.7 10*3/uL (ref 1.4–7.7)
Neutrophils Relative %: 74.8 % (ref 43.0–77.0)
Platelets: 358 10*3/uL (ref 150.0–400.0)
RBC: 3.92 Mil/uL — ABNORMAL LOW (ref 4.22–5.81)
RDW: 18.1 % — ABNORMAL HIGH (ref 11.5–15.5)
WBC: 6.2 10*3/uL (ref 4.0–10.5)

## 2023-10-18 LAB — COMPREHENSIVE METABOLIC PANEL
ALT: 23 U/L (ref 0–53)
AST: 32 U/L (ref 0–37)
Albumin: 4.2 g/dL (ref 3.5–5.2)
Alkaline Phosphatase: 73 U/L (ref 39–117)
BUN: 21 mg/dL (ref 6–23)
CO2: 27 meq/L (ref 19–32)
Calcium: 10.4 mg/dL (ref 8.4–10.5)
Chloride: 99 meq/L (ref 96–112)
Creatinine, Ser: 1.08 mg/dL (ref 0.40–1.50)
GFR: 68.17 mL/min (ref >60.00)
Glucose, Bld: 112 mg/dL — ABNORMAL HIGH (ref 70–99)
Potassium: 3.6 meq/L (ref 3.5–5.1)
Sodium: 141 meq/L (ref 135–145)
Total Bilirubin: 1.1 mg/dL (ref 0.2–1.2)
Total Protein: 7.7 g/dL (ref 6.0–8.3)

## 2023-10-18 NOTE — Progress Notes (Signed)
 Established Patient Office Visit  Subjective   Patient ID: Adam [name], male    DOB: 08-30-1951  Age: 73 y.o. MRN: [id number]  Chief Complaint  Patient presents with   Hospitalization Follow-up    Fall on 12/24- head injury- seen in the hospital 12/24 an 12/27 stayed for 2 weeks patient is now Non-Verbal     HPI   Adam [name] is here accompanied by wife.  He is unable to speak secondary to his neurologic problems.  He does communicate with writing.  Multiple chronic problems including history of hypertension, CAD, atrial fibrillation, thoracic aortic aneurysm, COPD, type 2 diabetes, pseudo bulbar palsy, Wernicke encephalopathy, alcohol abuse, history of recurrent depression, history of gout, recurrent falls, protein calorie malnutrition.  He had recent admission December 27 through January 6 following a fall and right thigh hematoma with acute blood loss anemia.  Had multiple imaging including chest x-ray, pelvic x-ray, CT head, CT cervical spine, CT chest abdomen pelvis, and x-rays left femur with no acute abnormalities other than soft tissue hematoma right hip.  Did receive 1 unit of packed red blood cells on 27th.  Hemoglobin stable following that.  Wife does states he has significant fatigue and frequently has cold intolerance.  History of frequent falls related to alcohol abuse as well as his pseudobulbar palsy and Wernicke encephalopathy.  Decision was made to take him off Eliquis  because of his multiple falls.  Does remain on aspirin  81 mg daily.  Also had some low blood pressure issues and was taken off metoprolol  and lisinopril .  Blood pressure has been stable since then.  He has dysphagia and manage just to take very small amount of fluid and food in per day.  He declined any consideration for PEG feeding in the past.  There was suggestion to consider palliative care or even hospice but patient is refusing at this time.  He has diabetes and is maintained on Synjardy  with recent A1c  5.9%.  Renal function stable.  Past Medical History:  Diagnosis Date   Alcohol withdrawal (HCC) 10/24/2019   Allergy    Anxiety    history of PTSD following CABG   Ascending aortic aneurysm (HCC) 01/31/2018   43 x 42 mm, pt unaware   Asthma    Cardiomegaly 10/17/2017   Colitis- colonoscopy 2014 07/13/2015   COPD (chronic obstructive pulmonary disease) (HCC)    Coronary artery disease    x 6   Depression    Diabetes mellitus without complication (HCC)    Family history of polyps in the colon    Finger dislocation    Left pinkie   GERD (gastroesophageal reflux disease)    Gout    H/O atrial fibrillation without current medication    following CABG with no documented episodes since then.   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   Past Surgical History:  Procedure Laterality Date   ANKLE FRACTURE SURGERY Right 1991   APPENDECTOMY     ATRIAL FIBRILLATION ABLATION N/A 03/31/2018   Procedure: ATRIAL FIBRILLATION ABLATION;  Surgeon: Kelsie Agent, MD;  Location: [facility];  Service: Cardiovascular;  Laterality: N/A;  CARDIAC ELECTROPHYSIOLOGY MAPPING AND ABLATION     CARDIOVERSION N/A 03/23/2021   Procedure: CARDIOVERSION;  Surgeon: Alveta Aleene PARAS, MD;  Location: [facility] ENDOSCOPY;  Service: Cardiovascular;  Laterality: N/A;   COLONOSCOPY  06/11/2021   COLONOSCOPY W/ BIOPSIES  2017   x7   CORONARY ANGIOPLASTY WITH STENT PLACEMENT     CORONARY ARTERY BYPASS GRAFT     FINGER SURGERY  04/2018   Small finger left hand   implantable loop recorder placement  07/02/2019   Medtronic Reveal Jolly model LNQ11 (SN [id number]) implanted in office by Dr Kelsie   TEE WITHOUT CARDIOVERSION  N/A 03/30/2018   Procedure: TRANSESOPHAGEAL ECHOCARDIOGRAM (TEE);  Surgeon: Francyne Headland, MD;  Location: [facility] ENDOSCOPY;  Service: Cardiovascular;  Laterality: N/A;   TEE WITHOUT CARDIOVERSION N/A 03/23/2021   Procedure: TRANSESOPHAGEAL ECHOCARDIOGRAM (TEE);  Surgeon: Alveta Aleene PARAS, MD;  Location: [facility] ENDOSCOPY;  Service: Cardiovascular;  Laterality: N/A;   TOTAL HIP ARTHROPLASTY Left    TOTAL HIP ARTHROPLASTY Right 09/12/2018   Procedure: TOTAL HIP ARTHROPLASTY ANTERIOR APPROACH;  Surgeon: Ernie Cough, MD;  Location: [facility] ORS;  Service: Orthopedics;  Laterality: Right;  70 mins    reports that he has never smoked. He has never used smokeless tobacco. He reports current alcohol use of about 3.0 - 4.0 standard drinks of alcohol per week. He reports that he does not use drugs. family history includes Cancer in his father; Colon cancer in his mother; Heart disease in his paternal grandfather and paternal grandmother. Allergies  Allergen Reactions   Amoxicillin  Rash and Other (See Comments)    RASH IN GROIN AREA Has patient had a PCN reaction causing immediate rash, facial/tongue/throat swelling, SOB or lightheadedness with hypotension: no Has patient had a PCN reaction causing severe rash involving mucus membranes or skin necrosis: no Has patient had a PCN reaction that required hospitalization: no Has patient had a PCN reaction occurring within the last 10 years: yes If all of the above answers are NO, then may proceed with Cephalosporin use.    Augmentin  [Amoxicillin -Pot Clavulanate] Rash and Other (See Comments)    RASH IN GROIN AREA   Azithromycin  Rash and Other (See Comments)    RASH IN GROIN AREA   Clindamycin /Lincomycin Rash   Keflex [Cephalexin] Rash   Xarelto  [Rivaroxaban ] Other (See Comments)    Patient stated he ended up in the hospital with a rectal bleed    Review of Systems  Constitutional:  Positive for malaise/fatigue. Negative for chills and fever.  Eyes:   Negative for blurred vision.  Respiratory:  Negative for shortness of breath.   Cardiovascular:  Negative for chest pain.  Gastrointestinal:  Negative for abdominal pain, blood in stool, nausea and vomiting.  Neurological:  Negative for dizziness and headaches.      Objective:     BP 132/84 (BP Location: Left Arm, Patient Position: Sitting, Cuff Size: Normal)   Pulse (!) 108   Temp 98.6 F (37 C) (Oral)   Ht 5' 10 (1.778 m)   Wt 154 lb 3.2 oz (69.9 kg)   SpO2 99%   BMI 22.13 kg/m  BP Readings from Last 3 Encounters:  10/18/23 132/84  10/10/23 123/67  09/27/23 94/62   Wt Readings from Last 3 Encounters:  10/18/23 154 lb 3.2 oz (69.9 kg)  09/30/23 150 lb (68 kg)  09/27/23 170 lb 10.2 oz (77.4 kg)      Physical Exam Vitals reviewed.  Constitutional:      General: He is not in acute  distress. Cardiovascular:     Rate and Rhythm: Normal rate and regular rhythm.  Pulmonary:     Effort: Pulmonary effort is normal.     Breath sounds: Normal breath sounds.  Abdominal:     Palpations: Abdomen is soft.     Tenderness: There is no abdominal tenderness.  Musculoskeletal:     Comments: Extensive bruising right lateral hip area extending down to the upper lateral thigh and up toward the buttock.  Neurological:     Mental Status: He is alert.     Comments: Aphasic.  No focal weakness.      No results found for any visits on 10/18/23.  Last CBC Lab Results  Component Value Date   WBC 5.7 10/05/2023   HGB 8.4 (L) 10/05/2023   HCT 25.7 (L) 10/05/2023   MCV 99.6 10/05/2023   MCH 32.6 10/05/2023   RDW 17.1 (H) 10/05/2023   PLT 182 10/05/2023   Last metabolic panel Lab Results  Component Value Date   GLUCOSE 110 (H) 10/05/2023   NA 137 10/05/2023   K 3.5 10/05/2023   CL 106 10/05/2023   CO2 24 10/05/2023   BUN 8 10/05/2023   CREATININE 0.65 10/05/2023   GFRNONAA >60 10/05/2023   CALCIUM  8.8 (L) 10/05/2023   PHOS 2.9 10/03/2023   PROT 5.8 (L) 09/30/2023    ALBUMIN 3.1 (L) 09/30/2023   LABGLOB 2.7 06/11/2022   AGRATIO 1.6 06/11/2022   BILITOT 1.0 09/30/2023   ALKPHOS 40 09/30/2023   AST 49 (H) 09/30/2023   ALT 30 09/30/2023   ANIONGAP 7 10/05/2023   Last hemoglobin A1c Lab Results  Component Value Date   HGBA1C 5.9 08/26/2023      The ASCVD Risk score (Arnett DK, et al., 2019) failed to calculate for the following reasons:   The valid total cholesterol range is 130 to 320 mg/dL    Assessment & Plan:   #1 history of Wernicke encephalopathy/pseudobulbar palsy, alcohol abuse with progressive neurologic decline.  Patient is aphasic.  Has tremendous challenges with eating and has had some progressive weight loss and protein calorie malnutrition.  Has refused PEG tube.  We had a long discussion regarding goals of care.  We have recommended strongly they consider hospice option but he is declining at this time.  Will consider this and give us  a call back if he changes his mind  We also discussed CODE STATUS and he does wish to remain DNR although he apparently change his mind with recent admission.  #2 acute blood loss anemia possibly related to recent hematoma.  Patient is not a good candidate for further aggressive evaluation with EGD or colonoscopy especially if labs stable.  Will recheck CBC today.  #3 hypertension history.  Currently blood pressure stable with significant recent weight loss.  Continue to hold lisinopril  and metoprolol   #4 extremely high fall risk.  Patient now off Eliquis .  #5 history of atrial fibrillation.  Appears to be in sinus rhythm at this time.  Continue to hold Eliquis  secondary to recurrent falls  #6 ascending thoracic aortic aneurysm.  Given his overall poor health status and limited life expectancy will not need to annual follow-up imaging   Return in about 2 months (around 12/16/2023).    Wolm Scarlet, MD

## 2023-10-25 ENCOUNTER — Encounter: Payer: Self-pay | Admitting: Family Medicine

## 2023-10-25 DIAGNOSIS — E512 Wernicke's encephalopathy: Secondary | ICD-10-CM

## 2023-11-11 NOTE — Therapy (Signed)
 [facility] [address] [place], [place], [address] Phone: [phone]   Fax:  [phone]  Patient Details  Name: Adam [name] MRN: [id number] Date of Birth: 04-12-1951 Referring Provider:  No ref. provider found  Encounter Date: 11/11/2023  PHYSICAL THERAPY DISCHARGE SUMMARY  Visits from Start of Care: 9  Current functional level related to goals / functional outcomes: SBA for mobility, STG/LTG partially met   Remaining deficits: High risk for falls, LE weakness   Education / Equipment: HEP, equipment recommendations for fall recovery   Patient agrees to discharge. Patient goals were partially met. Patient is being discharged due to not returning since the last visit.   Jonette MARLA Sandifer, PT 11/11/2023, 8:30 AM  [facility] [address] [place], [place], [address] Phone: [phone]   Fax:  [phone]

## 2023-11-25 ENCOUNTER — Encounter: Payer: Self-pay | Admitting: Family Medicine

## 2023-11-25 ENCOUNTER — Ambulatory Visit (INDEPENDENT_AMBULATORY_CARE_PROVIDER_SITE_OTHER): Payer: Medicare Other | Admitting: Family Medicine

## 2023-11-25 VITALS — BP 146/76 | HR 85 | Wt 166.2 lb

## 2023-11-25 DIAGNOSIS — I4819 Other persistent atrial fibrillation: Secondary | ICD-10-CM

## 2023-11-25 DIAGNOSIS — E1142 Type 2 diabetes mellitus with diabetic polyneuropathy: Secondary | ICD-10-CM

## 2023-11-25 DIAGNOSIS — G1229 Other motor neuron disease: Secondary | ICD-10-CM | POA: Diagnosis not present

## 2023-11-25 DIAGNOSIS — I10 Essential (primary) hypertension: Secondary | ICD-10-CM

## 2023-11-25 LAB — POCT GLYCOSYLATED HEMOGLOBIN (HGB A1C): Hemoglobin A1C: 5.8 % — AB (ref 4.0–5.6)

## 2023-11-25 NOTE — Progress Notes (Signed)
Established Patient Office Visit  Subjective   Patient ID: Adam [name], male    DOB: 06/28/1951  Age: 73 y.o. MRN: [id number]  Chief Complaint  Patient presents with   Medical Management of Chronic Issues    HPI   [name] is seen today companied by wife.  Just recently went under hospice care and they have found their services useful.  He is getting nursing aide and nurse check once a week.  They are helping with things like bathing.  He has multiple chronic problems including history of CAD, hypertension, atrial fibrillation, longstanding history of alcohol abuse, COPD, sleep apnea, type 2 diabetes, Wernicke encephalopathy with pseudo bulbar palsy related to his alcohol abuse, gout.  He has history of recurrent falls and in fact had another fall last week.  Ambulates with walker.  He does have some chronic peripheral neuropathy which increases his risk dramatically along with progressive weakness.  Has multiple healing abrasions from recent falls.  He has been abstinent of alcohol for the past couple of months.  He is unable to eat solid foods but is taking in 2-3 Ensure max supplements per day and also frequently eating things like puddings.  Surprisingly, his weight is up from 154 pounds last visit 266 pounds today.  No recent peripheral edema.  No dyspnea.  He is requesting repeat A1c.  Had been on Synjardy and this was discontinued last visit.  His last A1c 3 months ago was 5.9%.  They are not monitoring blood sugars regularly.  Wife states he has been recently consuming about 4 sodas per day  Past Medical History:  Diagnosis Date   Alcohol withdrawal (HCC) 10/24/2019   Allergy    Anxiety    history of PTSD following CABG   Ascending aortic aneurysm (HCC) 01/31/2018   43 x 42 mm, pt unaware   Asthma    Cardiomegaly 10/17/2017   Colitis- colonoscopy 2014 07/13/2015   COPD (chronic obstructive pulmonary disease) (HCC)    Coronary artery disease    x 6   Depression    Diabetes  mellitus without complication (HCC)    Family history of polyps in the colon    Finger dislocation    Left pinkie   GERD (gastroesophageal reflux disease)    Gout    H/O atrial fibrillation without current medication    following CABG with no documented episodes since then.   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   Past Surgical History:  Procedure Laterality Date   ANKLE FRACTURE SURGERY Right 1991   APPENDECTOMY     ATRIAL FIBRILLATION ABLATION N/A 03/31/2018   Procedure: ATRIAL FIBRILLATION ABLATION;  Surgeon: Hillis Range, MD;  Location: [facility];  Service: Cardiovascular;  Laterality: N/A;   CARDIAC ELECTROPHYSIOLOGY MAPPING AND ABLATION     CARDIOVERSION N/A 03/23/2021   Procedure: CARDIOVERSION;  Surgeon: Elease Hashimoto Deloris Ping, MD;  Location: [facility] ENDOSCOPY;  Service: Cardiovascular;  Laterality: N/A;   COLONOSCOPY  06/11/2021   COLONOSCOPY W/ BIOPSIES  2017   x7   CORONARY ANGIOPLASTY WITH STENT PLACEMENT     CORONARY ARTERY BYPASS GRAFT  FINGER SURGERY  04/2018   Small finger left hand   implantable loop recorder placement  07/02/2019   Medtronic Reveal [place] model [id number] [facility] [id number]) implanted in office by Dr Johney Frame   TEE WITHOUT CARDIOVERSION N/A 03/30/2018   Procedure: TRANSESOPHAGEAL ECHOCARDIOGRAM (TEE);  Surgeon: Thurmon Fair, MD;  Location: [facility] ENDOSCOPY;  Service: Cardiovascular;  Laterality: N/A;   TEE WITHOUT CARDIOVERSION N/A 03/23/2021   Procedure: TRANSESOPHAGEAL ECHOCARDIOGRAM (TEE);  Surgeon: Elease Hashimoto Deloris Ping, MD;  Location: [facility] ENDOSCOPY;  Service: Cardiovascular;  Laterality: N/A;   TOTAL HIP ARTHROPLASTY Left    TOTAL HIP ARTHROPLASTY Right  09/12/2018   Procedure: TOTAL HIP ARTHROPLASTY ANTERIOR APPROACH;  Surgeon: Durene Romans, MD;  Location: [facility] ORS;  Service: Orthopedics;  Laterality: Right;  70 mins    reports that he has never smoked. He has never used smokeless tobacco. He reports current alcohol use of about 3.0 - 4.0 standard drinks of alcohol per week. He reports that he does not use drugs. family history includes Cancer in his father; Colon cancer in his mother; Heart disease in his paternal grandfather and paternal grandmother. Allergies  Allergen Reactions   Amoxicillin Rash and Other (See Comments)    RASH IN GROIN AREA Has patient had a PCN reaction causing immediate rash, facial/tongue/throat swelling, SOB or lightheadedness with hypotension: no Has patient had a PCN reaction causing severe rash involving mucus membranes or skin necrosis: no Has patient had a PCN reaction that required hospitalization: no Has patient had a PCN reaction occurring within the last 10 years: yes If all of the above answers are "NO", then may proceed with Cephalosporin use.    Augmentin [Amoxicillin-Pot Clavulanate] Rash and Other (See Comments)    RASH IN GROIN AREA   Azithromycin Rash and Other (See Comments)    RASH IN GROIN AREA   Clindamycin/Lincomycin Rash   Keflex [Cephalexin] Rash   Xarelto [Rivaroxaban] Other (See Comments)    Patient stated he "ended up in the hospital with a rectal bleed"    Review of Systems  Constitutional:  Negative for chills, fever and malaise/fatigue.  Eyes:  Negative for blurred vision.  Respiratory:  Negative for shortness of breath.   Cardiovascular:  Negative for chest pain.  Gastrointestinal:  Negative for abdominal pain.  Genitourinary:  Negative for dysuria.  Neurological:  Negative for dizziness and headaches.      Objective:     BP (!) 146/76 (BP Location: Left Arm, Patient Position: Sitting, Cuff Size: Normal)   Pulse 85   Wt 166 lb 3.2 oz (75.4 kg)   SpO2 99%   BMI 23.85  kg/m  BP Readings from Last 3 Encounters:  11/25/23 (!) 146/76  10/18/23 132/84  10/10/23 123/67   Wt Readings from Last 3 Encounters:  11/25/23 166 lb 3.2 oz (75.4 kg)  10/18/23 154 lb 3.2 oz (69.9 kg)  09/30/23 150 lb (68 kg)      Physical Exam Vitals reviewed.  Constitutional:      Comments: Unable to communicate verbally other than groaning.  Communicates with writing.  Cardiovascular:     Rate and Rhythm: Normal rate and regular rhythm.  Pulmonary:     Effort: Pulmonary effort is normal.     Breath sounds: Normal breath sounds. No wheezing or rales.  Musculoskeletal:     Right lower leg: No edema.     Left lower leg: No edema.  Neurological:     Mental Status: He is alert.      Results for  orders placed or performed in visit on 11/25/23  POC HgB A1c  Result Value Ref Range   Hemoglobin A1C 5.8 (A) 4.0 - 5.6 %   HbA1c POC (<> result, manual entry)     HbA1c, POC (prediabetic range)     HbA1c, POC (controlled diabetic range)        The ASCVD Risk score (Arnett DK, et al., 2019) failed to calculate for the following reasons:   The valid total cholesterol range is 130 to 320 mg/dL    Assessment & Plan:   #1 progressive neurologic disorder with Wernicke encephalopathy/pseudobulbar palsy in the setting of years of alcohol abuse.  He has been abstinent now for couple months.  Extremely high risk of falls.  Unable to eat solids.  Surprisingly has put on about 12 pounds weight gain since last visit and is taking supplements regularly.  Patient recently referred to Hospice and they are pleased with their care thus far.  #2 type 2 diabetes.  A1c today stable at 5.8%.  We discontinued Synjardy recently-and blood sugars have remained stable.  We did caution him about several high glycemic foods if his weight continues to climb although our main focus at this point is comfort care  #3 history of atrial fibrillation.  Patient was taken off Eliquis secondary to recurrent  falls.  Appears to be in sinus rhythm today.  Remains on amiodarone.  Pending follow-up with cardiology next week.    #4 history of hypertension.  Blood pressure up today seated at rest slightly but he has had some actual orthostatic symptoms intermittently and with hospice care blood pressures have frequently been on the low side.  Was taken off lisinopril with recent admission.  Will continue to monitor for now.  Return in about 3 months (around 02/22/2024).    Evelena Peat, MD

## 2023-11-25 NOTE — Patient Instructions (Signed)
A1C today stable at 5.8%.  Glad your weight has come back up some.

## 2023-12-07 ENCOUNTER — Telehealth: Payer: Self-pay | Admitting: Neurology

## 2023-12-07 NOTE — Telephone Encounter (Signed)
 Pt's wife to verify appointment

## 2023-12-12 ENCOUNTER — Ambulatory Visit (INDEPENDENT_AMBULATORY_CARE_PROVIDER_SITE_OTHER)

## 2023-12-12 VITALS — BP 120/62 | HR 61 | Ht 70.0 in | Wt 169.9 lb

## 2023-12-12 DIAGNOSIS — Z0000 Encounter for general adult medical examination without abnormal findings: Secondary | ICD-10-CM | POA: Diagnosis not present

## 2023-12-12 NOTE — Progress Notes (Addendum)
 Subjective:   Adam [name] is a 73 y.o. male who presents for Medicare Annual/Subsequent preventive examination.  Visit Complete: In person    Cardiac Risk Factors include: advanced age (>76men, >61 women);diabetes mellitus;male gender;hypertension     Objective:    Today's Vitals   12/12/23 1341 12/12/23 1355  BP: 120/62   Pulse: 61   Weight: 169 lb 14.4 oz (77.1 kg)   Height: 5\' 10"  (1.778 m)   PainSc:  3    Body mass index is 24.38 kg/m.     12/12/2023    2:15 PM 09/30/2023    1:35 AM 09/27/2023    4:24 PM 08/31/2023   12:43 PM 08/29/2023    3:22 PM 01/05/2023    2:02 PM 11/05/2022    9:06 PM  Advanced Directives  Does Patient Have a Medical Advance Directive? Yes No No Yes Yes Yes No  Type of Estate agent of [place];Living will   Healthcare Power of [place];Living will Healthcare Power of [place];Living will Healthcare Power of [place];Living will   Does patient want to make changes to medical advance directive? No - Patient declined        Copy of Healthcare Power of Attorney in Chart? Yes - validated most recent copy scanned in chart (See row information)     No - copy requested   Would patient like information on creating a medical advance directive?  No - Patient declined No - Patient declined    No - Patient declined    Current Medications (verified) Outpatient Encounter Medications as of 12/12/2023  Medication Sig   acetaminophen (TYLENOL) 500 MG tablet Take 500 mg by mouth every 6 (six) hours as needed for mild pain (pain score 1-3) or moderate pain (pain score 4-6).   allopurinol (ZYLOPRIM) 300 MG tablet TAKE 1 TABLET(300 MG) BY MOUTH DAILY (Patient not taking: Reported on 12/12/2023)   amiodarone (PACERONE) 200 MG tablet TAKE 1 TABLET(200 MG) BY MOUTH DAILY   aspirin EC 81 MG tablet Take 1 tablet (81 mg total) by mouth daily. Swallow whole.   cyanocobalamin (VITAMIN B12) 500 MCG tablet Take 500 mcg by mouth daily. (Patient not  taking: Reported on 12/12/2023)   Dextromethorphan-quiNIDine (NUEDEXTA) 20-10 MG capsule Take 1 capsule by mouth daily.   DULoxetine (CYMBALTA) 60 MG capsule Take one capsule by mouth once daily   gabapentin (NEURONTIN) 300 MG capsule TAKE 2 CAPSULES BY MOUTH AT NIGHT AS NEEDED FOR RESTLESS LEGS OR SYMPTOMS   HM LIDOCAINE PATCH EX Apply 1 patch topically as needed (pain).   ipratropium (ATROVENT) 0.03 % nasal spray Place 2 sprays into both nostrils See admin instructions. Instill 2 sprays into each nostril 2-3 times a day. (Patient not taking: Reported on 12/12/2023)   Magnesium Oxide 400 MG CAPS Take 1 capsule (400 mg total) by mouth daily. (Patient not taking: Reported on 12/12/2023)   PREVIDENT 5000 ENAMEL PROTECT 1.1-5 % GEL USE TO BRUSH TEETH FOR 2 MINUTES TWICE DAILY THEN SPIT OUT AFTER. NO FOOD OR DRINK FOR 30 MINUTES AFTER (Patient not taking: Reported on 12/12/2023)   rosuvastatin (CRESTOR) 40 MG tablet TAKE 1 TABLET(40 MG) BY MOUTH DAILY (Patient not taking: Reported on 12/12/2023)   SKYRIZI PEN 150 MG/ML SOAJ Inject into the skin. Takes every 3 months   sodium chloride (OCEAN) 0.65 % SOLN nasal spray Place 1 spray into both nostrils as needed for congestion.   Spacer/Aero-Holding Rudean Curt Use with inhaler (Patient not taking: Reported on 12/12/2023)  thiamine (VITAMIN B-1) 100 MG tablet Take 500 mg by mouth daily.   triamcinolone cream (KENALOG) 0.1 % SMARTSIG:1 Application Topical 2-3 Times Daily (Patient not taking: Reported on 12/12/2023)   No facility-administered encounter medications on file as of 12/12/2023.    Allergies (verified) Amoxicillin, Augmentin [amoxicillin-pot clavulanate], Azithromycin, Clindamycin/lincomycin, Keflex [cephalexin], and Xarelto [rivaroxaban]   History: Past Medical History:  Diagnosis Date   Alcohol withdrawal (HCC) 10/24/2019   Allergy    Anxiety    history of PTSD following CABG   Ascending aortic aneurysm (HCC) 01/31/2018   43 x 42 mm, pt  unaware   Asthma    Cardiomegaly 10/17/2017   Colitis- colonoscopy 2014 07/13/2015   COPD (chronic obstructive pulmonary disease) (HCC)    Coronary artery disease    x 6   Depression    Diabetes mellitus without complication (HCC)    Family history of polyps in the colon    Finger dislocation    Left pinkie   GERD (gastroesophageal reflux disease)    Gout    H/O atrial fibrillation without current medication    following CABG with no documented episodes since then.   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   Past Surgical History:  Procedure Laterality Date   ANKLE FRACTURE SURGERY Right 1991   APPENDECTOMY     ATRIAL FIBRILLATION ABLATION N/A 03/31/2018   Procedure: ATRIAL FIBRILLATION ABLATION;  Surgeon: Hillis Range, MD;  Location: [facility]VASIVE [facility];  Service: Cardiovascular;  Laterality: N/A;   CARDIAC ELECTROPHYSIOLOGY MAPPING AND ABLATION     CARDIOVERSION N/A 03/23/2021   Procedure: CARDIOVERSION;  Surgeon: Elease Hashimoto Deloris Ping, MD;  Location: [facility] ENDOSCOPY;  Service: Cardiovascular;  Laterality: N/A;   COLONOSCOPY  06/11/2021   COLONOSCOPY W/ BIOPSIES  2017   x7   CORONARY ANGIOPLASTY WITH STENT PLACEMENT     CORONARY ARTERY BYPASS GRAFT     FINGER SURGERY  04/2018   Small finger left hand   implantable loop recorder placement  07/02/2019   Medtronic Reveal [place] model LNQ11 (SN [id number]) implanted in office by Dr Johney Frame   TEE WITHOUT CARDIOVERSION N/A 03/30/2018   Procedure: TRANSESOPHAGEAL ECHOCARDIOGRAM (TEE);  Surgeon: Thurmon Fair, MD;  Location: [facility] ENDOSCOPY;  Service: Cardiovascular;  Laterality: N/A;   TEE WITHOUT CARDIOVERSION N/A 03/23/2021   Procedure:  TRANSESOPHAGEAL ECHOCARDIOGRAM (TEE);  Surgeon: Elease Hashimoto Deloris Ping, MD;  Location: [facility] ENDOSCOPY;  Service: Cardiovascular;  Laterality: N/A;   TOTAL HIP ARTHROPLASTY Left    TOTAL HIP ARTHROPLASTY Right 09/12/2018   Procedure: TOTAL HIP ARTHROPLASTY ANTERIOR APPROACH;  Surgeon: Durene Romans, MD;  Location: [facility] ORS;  Service: Orthopedics;  Laterality: Right;  70 mins   Family History  Problem Relation Age of Onset   Colon cancer Mother    Cancer Father        UNKNOWN TYPE   Heart disease Paternal Grandmother    Heart disease Paternal Grandfather    Esophageal cancer Neg Hx    Rectal cancer Neg Hx    Stomach cancer Neg Hx    Social History   Socioeconomic History   Marital status: Married    Spouse name: Not  on file   Number of children: 0   Years of education: Not on file   Highest education level: Not on file  Occupational History   Occupation: retired  Tobacco Use   Smoking status: Never   Smokeless tobacco: Never  Vaping Use   Vaping status: Never Used  Substance and Sexual Activity   Alcohol use: Yes    Alcohol/week: 3.0 - 4.0 standard drinks of alcohol    Types: 1 Glasses of wine, 2 - 3 Shots of liquor per week    Comment: 4 drinks daily 01/26/23   Drug use: No    Comment: Smoked Marijuana back in 1970s   Sexual activity: Yes  Other Topics Concern   Not on file  Social History Narrative   ** Merged History Encounter **       Married, 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 - returned to [employer] from [place] 2014 Enjoys bird watching, reading historical books about WWII, watching tv   Social Drivers of Corporate investment banker Strain: Low Risk  (12/12/2023)   Overall Financial Resource Strain (CARDIA)    Difficulty of Paying Living Expenses: Not hard at all  Food Insecurity: No Food Insecurity (12/12/2023)   Hunger Vital Sign    Worried About Running Out of Food in the Last Year: Never true    Ran Out of Food in the  Last Year: Never true  Transportation Needs: No Transportation Needs (12/12/2023)   PRAPARE - Administrator, Civil Service (Medical): No    Lack of Transportation (Non-Medical): No  Physical Activity: Inactive (12/12/2023)   Exercise Vital Sign    Days of Exercise per Week: 0 days    Minutes of Exercise per Session: 0 min  Stress: No Stress Concern Present (12/12/2023)   [employer] of Occupational Health - Occupational Stress Questionnaire    Feeling of Stress : Not at all  Social Connections: Moderately Isolated (12/12/2023)   Social Connection and Isolation Panel [NHANES]    Frequency of Communication with Friends and Family: More than three times a week    Frequency of Social Gatherings with Friends and Family: More than three times a week    Attends Religious Services: Never    Database administrator or Organizations: No    Attends Engineer, structural: Never    Marital Status: Married    Tobacco Counseling Counseling given: Not Answered   Clinical Intake:  Pre-visit preparation completed: Yes  Pain : 0-10 Pain Score: 3  Pain Type: Acute pain Pain Location: Neck Pain Orientation: Right Pain Descriptors / Indicators: Aching Pain Onset: 1 to 4 weeks ago Pain Frequency: Intermittent Pain Relieving Factors: OTC Pain patch Effect of Pain on Daily Activities: N/A  Pain Relieving Factors: OTC Pain patch  BMI - recorded: 24.38 Nutritional Status: BMI of 19-24  Normal Nutritional Risks: None Diabetes: No  How often do you need to have someone help you when you read instructions, pamphlets, or other written materials from your doctor or pharmacy?: 1 - Never  Interpreter Needed?: No  Information entered by :: Theresa Mulligan LPN   Activities of Daily Living    12/12/2023    2:08 PM 09/30/2023    7:00 PM  In your present state of health, do you have any difficulty performing the following activities:  Hearing? 0 1  Vision? 0 0  Difficulty  concentrating or making decisions? 0 1  Walking  or climbing stairs? 1   Comment Uses a Walker   Dressing or bathing? 0   Doing errands, shopping? 1 1  Comment Wife Advice worker and eating ? N   Using the Toilet? N   In the past six months, have you accidently leaked urine? Y   Comment Incontient wears Breifs   Do you have problems with loss of bowel control? Y   Comment Incontient wears Breifs   Managing your Medications? N   Managing your Finances? Y   Comment Wife assist   Housekeeping or managing your Housekeeping? Y   Comment Wife assist     Patient Care Team: Kristian Covey, MD as PCP - General (Family Medicine) Hillis Range, MD (Inactive) as PCP - Electrophysiology (Cardiology) Quintella Reichert, MD as PCP - Sleep Medicine (Sleep Medicine) [place], [name], MD as PCP - Cardiology (Cardiology) Zannie Cove, MD as Referring Physician (Internal Medicine) Verner Chol, [facility] (Inactive) as Pharmacist (Pharmacist) Kristian Covey, MD (Family Medicine)  Indicate any recent Medical Services you may have received from other than [facility] providers in the past year (date may be approximate).     Assessment:   This is a routine wellness examination for [place].  Hearing/Vision screen Hearing Screening - Comments:: Denies hearing difficulties   Vision Screening - Comments:: Wears rx glasses - up to date with routine eye exams with  Dr Lorin Picket   Goals Addressed               This Visit's Progress     Increase physical activity (pt-stated)        Increase balance control.       Depression Screen    12/12/2023    2:06 PM 10/18/2023   11:54 AM 01/05/2023    1:57 PM 01/05/2023    1:56 PM 09/03/2022   11:29 AM 07/23/2022   12:01 PM 12/30/2021    4:00 PM  PHQ 2/9 Scores  PHQ - 2 Score 0 6 0 0 6 5 0  PHQ- 9 Score 0 19   21 20      Fall Risk    12/12/2023    2:11 PM 08/17/2023    9:35 AM 01/05/2023    2:00 PM 07/23/2022   12:03 PM 12/30/2021    4:05 PM   Fall Risk   Falls in the past year? 1  1 1 1   Number falls in past yr: 1 1 0 1 0  Injury with Fall? 0 1 0 0 0  Comment     Bruising to arms and legs. No medical attention needed  Risk for fall due to : History of fall(s);Impaired balance/gait History of fall(s);Impaired balance/gait No Fall Risks No Fall Risks No Fall Risks  Follow up Falls prevention discussed;Falls evaluation completed  Falls prevention discussed Falls evaluation completed     MEDICARE RISK AT HOME: Medicare Risk at Home Any stairs in or around the home?: No If so, are there any without handrails?: No Home free of loose throw rugs in walkways, pet beds, electrical cords, etc?: Yes Adequate lighting in your home to reduce risk of falls?: Yes Life alert?: No Use of a cane, walker or w/c?: Yes Grab bars in the bathroom?: Yes Shower chair or bench in shower?: Yes Elevated toilet seat or a handicapped toilet?: Yes  TIMED UP AND GO:  Was the test performed?  Yes  Length of time to ambulate 10 feet: 10 sec Gait slow and steady with  assistive device    Cognitive Function:    06/26/2018   11:32 AM 06/08/2017   11:50 AM  MMSE - Mini Mental State Exam  Not completed: -- --        12/12/2023    2:15 PM 01/05/2023    2:02 PM 12/30/2021    4:08 PM 12/24/2020   11:44 AM 11/20/2019   11:07 AM  6CIT Screen  What Year? 0 points 0 points 0 points 0 points 0 points  What month? 0 points 0 points 0 points 0 points 0 points  What time? 0 points 0 points 0 points 0 points 0 points  Count back from 20 0 points 0 points 0 points 0 points 0 points  Months in reverse 0 points 0 points 0 points 0 points 0 points  Repeat phrase 0 points 0 points 0 points 0 points 0 points  Total Score 0 points 0 points 0 points 0 points 0 points    Immunizations Immunization History  Administered Date(s) Administered   Fluad Quad(high Dose 65+) 06/25/2019, 07/18/2020, 07/10/2021, 07/01/2022   Fluad Trivalent(High Dose 65+) 07/04/2023    Influenza Split 07/04/2012   Influenza, High Dose Seasonal PF 05/28/2016, 06/08/2017, 06/23/2018   Influenza,inj,Quad PF,6+ Mos 07/05/2013, 05/22/2014, 06/17/2015   PFIZER(Purple Top)SARS-COV-2 Vaccination 12/08/2019, 01/08/2020, 09/28/2020   Pneumococcal Conjugate-13 12/01/2015   Pneumococcal Polysaccharide-23 10/05/2006, 11/30/2016   Td 10/06/2009   Tdap 09/27/2023   Zoster Recombinant(Shingrix) 12/02/2016, 04/02/2017   Zoster, Live 08/21/2013    TDAP status: Up to date  Flu Vaccine status: Up to date  Pneumococcal vaccine status: Up to date  Covid-19 vaccine status: Declined, Education has been provided regarding the importance of this vaccine but patient still declined. Advised may receive this vaccine at local pharmacy or Health Dept.or vaccine clinic. Aware to provide a copy of the vaccination record if obtained from local pharmacy or Health Dept. Verbalized acceptance and understanding.  Qualifies for Shingles Vaccine? Yes   Zostavax completed Yes   Shingrix Completed?: Yes  Screening Tests Health Maintenance  Topic Date Due   FOOT EXAM  09/23/2020   COVID-19 Vaccine (4 - 2024-25 season) 06/05/2023   HEMOGLOBIN A1C  05/24/2024   OPHTHALMOLOGY EXAM  10/04/2024   DTaP/Tdap/Td (3 - Td or Tdap) 09/26/2033   Pneumonia Vaccine 60+ Years old  Completed   INFLUENZA VACCINE  Completed   Hepatitis C Screening  Completed   Zoster Vaccines- Shingrix  Completed   HPV VACCINES  Aged Out   Colonoscopy  Discontinued    Health Maintenance  Health Maintenance Due  Topic Date Due   FOOT EXAM  09/23/2020   COVID-19 Vaccine (4 - 2024-25 season) 06/05/2023     Additional Screening:  Hepatitis C Screening: does qualify; Completed 06/15/17  Vision Screening: Recommended annual ophthalmology exams for early detection of glaucoma and other disorders of the eye. Is the patient up to date with their annual eye exam?  Yes  Who is the provider or what is the name of the office in which  the patient attends annual eye exams? Dr Lorin Picket If pt is not established with a provider, would they like to be referred to a provider to establish care? No .   Dental Screening: Recommended annual dental exams for proper oral hygiene  Diabetic Foot Exam: Diabetic Foot Exam: Overdue, Pt has been advised about the importance in completing this exam. Pt is scheduled for diabetic foot exam on Deferred.  Community Resource Referral / Chronic Care Management:  CRR required  this visit?  No   CCM required this visit?  No     Plan:     I have personally reviewed and noted the following in the patient's chart:   Medical and social history Use of alcohol, tobacco or illicit drugs  Current medications and supplements including opioid prescriptions. Patient is not currently taking opioid prescriptions. Functional ability and status Nutritional status Physical activity Advanced directives List of other physicians Hospitalizations, surgeries, and ER visits in previous 12 months Vitals Screenings to include cognitive, depression, and falls Referrals and appointments  In addition, I have reviewed and discussed with patient certain preventive protocols, quality metrics, and best practice recommendations. A written personalized care plan for preventive services as well as general preventive health recommendations were provided to patient.     Tillie Rung, LPN   01/10/8118   After Visit Summary: (In Person-Printed) AVS printed and given to the patient  Nurse Notes: None

## 2023-12-12 NOTE — Patient Instructions (Addendum)
 Adam [name] , Thank you for taking time to come for your Medicare Wellness Visit. I appreciate your ongoing commitment to your health goals. Please review the following plan we discussed and let me know if I can assist you in the future.   Referrals/Orders/Follow-Ups/Clinician Recommendations:   This is a list of the screening recommended for you and due dates:  Health Maintenance  Topic Date Due   Complete foot exam   09/23/2020   COVID-19 Vaccine (4 - 2024-25 season) 06/05/2023   Hemoglobin A1C  05/24/2024   Eye exam for diabetics  10/04/2024   DTaP/Tdap/Td vaccine (3 - Td or Tdap) 09/26/2033   Pneumonia Vaccine  Completed   Flu Shot  Completed   Hepatitis C Screening  Completed   Zoster (Shingles) Vaccine  Completed   HPV Vaccine  Aged Out   Colon Cancer Screening  Discontinued    Advanced directives: (In Chart) A copy of your advanced directives are scanned into your chart should your provider ever need it.  Next Medicare Annual Wellness Visit scheduled for next year: Yes

## 2023-12-26 NOTE — Progress Notes (Unsigned)
 Cardiology Office Note   Date:  12/30/2023   ID:  JANIEL CRISOSTOMO, DOB 05-14-1951, MRN [id number]  PCP:  Adam Covey, MD  Cardiologist:   Brytnee Bechler [place], MD   Chief Complaint  Patient presents with   Atrial Fibrillation   Coronary Artery Disease      History of Present Illness: Adam [name] is a 73 y.o. male who presents for follow up CAD and Afib.  He has a past medical history which includes CABG in 2011 done in [place], HTN, HLD, DM, Atrial fibrillation, and OSA- previously followed by Dr Mayford Knife. He underwent catheter ablation for atrial fibrillation by Dr. Johney Frame 03/31/18. He was on Tikosyn and Xarelto. Event monitor in June 2020 showed no Afib. Because of intermittent palpitations Dr Johney Frame placed an ILR in September.   He no longer follows with Dr Mayford Knife since unable to tolerate CPAP.   He had an echo in January of 2019 that showed normal LVEF and no significant valvular abnormalities. Stress test in 2013 showed no ischemia. He had an abnormal chest CT done 01/2018 that showed an ascending aortic aneurysm, measuring 43 x 42 mm. Repeat CT angio in May 2020 showed no change in size. Last CT in 2021 showed greatest diameter 4.2 cm.   The patient reports that prior to CABG he had 25 stents placed over a 10-12 year period.  These procedures and his bypass were done in [place], [place].  He was admitted in May 2021 with acute encephalopathy felt to be due to alcohol withdrawal and Wernicke's encephalopathy.  He was in Afib during this hospitalization initially w/RVR though rate controlled by discharge. He was later resumed on Tikosyn. He was at [facility] for Rehab for 7 months.   He was admitted in June 2022 with Afib. Had DCCV. Switched from Tikosyn to amiodarone. When seen in Afib clinic in April was in NSR. Continues Etoh abuse. Progressive Wernicke's aphasia. Last ILD check January 23, 2023 showed no arrhythmia.   He was admitted in Dec with a fall and thigh hematoma. Required transfusion x  1. Not felt to be a candidate for anticoagulation due to ongoing Etoh use and falls. He is now on home hospice. Can't speak or swallow well. Eating puddings, yogurt, Ensure and liquids. Did fall a couple of week ago injuring his left shoulder.   He is seen today with his wife. Denies any chest pain. Has some SOB being followed by pulmonary. No palpitations, edema or orthopnea.    Past Medical History:  Diagnosis Date   Alcohol withdrawal (HCC) 10/24/2019   Allergy    Anxiety    history of PTSD following CABG   Ascending aortic aneurysm (HCC) 01/31/2018   43 x 42 mm, pt unaware   Asthma    Cardiomegaly 10/17/2017   Colitis- colonoscopy 2014 07/13/2015   COPD (chronic obstructive pulmonary disease) (HCC)    Coronary artery disease    x 6   Depression    Diabetes mellitus without complication (HCC)    Family history of polyps in the colon    Finger dislocation    Left pinkie   GERD (gastroesophageal reflux disease)    Gout    H/O atrial fibrillation without current medication    following CABG with no documented episodes since then.   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    Past Surgical History:  Procedure Laterality Date   ANKLE FRACTURE SURGERY Right 1991   APPENDECTOMY     ATRIAL FIBRILLATION ABLATION N/A 03/31/2018   Procedure: ATRIAL FIBRILLATION ABLATION;  Surgeon: Hillis Range, MD;  Location: [facility] INVASIVE CV LAB;  Service: Cardiovascular;  Laterality: N/A;   CARDIAC ELECTROPHYSIOLOGY MAPPING AND ABLATION     CARDIOVERSION N/A 03/23/2021   Procedure: CARDIOVERSION;  Surgeon: Elease Hashimoto Deloris Ping, MD;  Location: [facility] ENDOSCOPY;  Service: Cardiovascular;  Laterality: N/A;    COLONOSCOPY  06/11/2021   COLONOSCOPY W/ BIOPSIES  2017   x7   CORONARY ANGIOPLASTY WITH STENT PLACEMENT     CORONARY ARTERY BYPASS GRAFT     FINGER SURGERY  04/2018   Small finger left hand   implantable loop recorder placement  07/02/2019   Medtronic Reveal [place] model LNQ11 (SN [id number]) implanted in office by Dr Johney Frame   TEE WITHOUT CARDIOVERSION N/A 03/30/2018   Procedure: TRANSESOPHAGEAL ECHOCARDIOGRAM (TEE);  Surgeon: Thurmon Fair, MD;  Location: [facility] ENDOSCOPY;  Service: Cardiovascular;  Laterality: N/A;   TEE WITHOUT CARDIOVERSION N/A 03/23/2021   Procedure: TRANSESOPHAGEAL ECHOCARDIOGRAM (TEE);  Surgeon: Elease Hashimoto Deloris Ping, MD;  Location: [facility] ENDOSCOPY;  Service: Cardiovascular;  Laterality: N/A;   TOTAL HIP ARTHROPLASTY Left    TOTAL HIP ARTHROPLASTY Right 09/12/2018   Procedure: TOTAL HIP ARTHROPLASTY ANTERIOR APPROACH;  Surgeon: Durene Romans, MD;  Location: [facility] ORS;  Service: Orthopedics;  Laterality: Right;  70 mins     Current Outpatient Medications  Medication Sig Dispense Refill   acetaminophen (TYLENOL) 500 MG tablet Take 500 mg by mouth every 6 (six) hours as needed for mild pain (pain score 1-3) or moderate pain (pain score 4-6).     allopurinol (ZYLOPRIM) 300 MG tablet TAKE 1 TABLET(300 MG) BY MOUTH DAILY 90 tablet 0   amiodarone (PACERONE) 200 MG tablet TAKE 1 TABLET(200 MG) BY MOUTH DAILY 90 tablet 2   aspirin EC 81 MG tablet Take 1 tablet (81 mg total) by mouth daily. Swallow whole.     cyanocobalamin (VITAMIN B12) 500 MCG tablet Take 500 mcg by mouth daily.     Dextromethorphan-quiNIDine (NUEDEXTA) 20-10 MG capsule Take 1 capsule by mouth daily. 30 capsule 11   DULoxetine (CYMBALTA) 60 MG capsule Take one capsule by mouth once daily 90 capsule 3   gabapentin (NEURONTIN) 300 MG capsule TAKE 2 CAPSULES BY MOUTH AT NIGHT AS NEEDED FOR RESTLESS LEGS OR SYMPTOMS 180 capsule 3   HM LIDOCAINE PATCH EX Apply 1 patch topically as needed (pain).     PREVIDENT 5000 ENAMEL  PROTECT 1.1-5 % GEL      SKYRIZI PEN 150 MG/ML SOAJ Inject into the skin. Takes every 3 months     thiamine (VITAMIN B-1) 100 MG tablet Take 500 mg by mouth daily.     traMADol (ULTRAM) 50 MG tablet Take 50 mg by mouth every 6 (six) hours as needed for severe pain (pain score 7-10).     sodium chloride (OCEAN) 0.65 % SOLN nasal spray Place 1 spray into both nostrils as needed for congestion.     No current facility-administered medications for this visit.    Allergies:   Amoxicillin, Augmentin [amoxicillin-pot clavulanate], Azithromycin, Clindamycin/lincomycin, Keflex [cephalexin], and Xarelto [rivaroxaban]    Social History:  The patient  reports that he has never smoked. He has never used smokeless tobacco. He reports current alcohol use of about 3.0 - 4.0 standard drinks of alcohol per week. He reports that he does not use drugs.   Family History:  The patient's family history includes Cancer in his father; Colon cancer in his mother; Heart disease in his paternal grandfather and paternal grandmother.    ROS:  Please see the history of present illness.   Otherwise, review of systems are positive for none.   All other systems are reviewed and negative.    PHYSICAL EXAM: VS:  BP (!) 157/91   Pulse 93   SpO2 99%  , BMI There is no height or weight on file to calculate BMI. GEN: chronically ill appearing HEENT: normal  Neck: no JVD, carotid bruits, or masses Cardiac: RRR; no murmurs, rubs, or gallops,no edema  Respiratory:  clear to auscultation bilaterally, normal work of breathing    EKG:  EKG is not ordered today. The ekg ordered today demonstrates N/A   Recent Labs: 07/28/2023: TSH 2.122 10/03/2023: Magnesium 1.8 10/18/2023: ALT 23; BUN 21; Creatinine, Ser 1.08; Hemoglobin 13.1; Platelets 358.0; Potassium 3.6; Sodium 141    Lipid Panel    Component Value Date/Time   CHOL 92 01/25/2023 1133   CHOL 119 06/11/2022 0920   TRIG 115.0 01/25/2023 1133   HDL 38.50 (L)  01/25/2023 6578   HDL 37 (L) 06/11/2022 0920   CHOLHDL 2 01/25/2023 1133   VLDL 23.0 01/25/2023 1133   LDLCALC 31 01/25/2023 1133   LDLCALC 51 06/11/2022 0920   LDLCALC 64 09/23/2020 1216   LDLDIRECT 124.0 06/23/2018 1118      Wt Readings from Last 3 Encounters:  12/12/23 169 lb 14.4 oz (77.1 kg)  11/25/23 166 lb 3.2 oz (75.4 kg)  10/18/23 154 lb 3.2 oz (69.9 kg)      Other studies Reviewed: Additional studies/ records that were reviewed today include:  Echo 03/30/18: Study Conclusions   - Left ventricle: The cavity size was normal. Wall thickness was   normal. Systolic function was normal. The estimated ejection   fraction was in the range of 55% to 60%. Wall motion was normal;   there were no regional wall motion abnormalities. - Mitral valve: Mildly calcified annulus. - Left atrium: No evidence of thrombus in the atrial cavity or   appendage. No spontaneous echo contrast was observed. The   appendage was morphologically a left appendage, multilobulated,   and of normal size. Emptying velocity was mildly reduced. - Right atrium: No evidence of thrombus in the atrial cavity or   appendage. - Atrial septum: There was a very small patent foramen ovale.   Doppler showed a trivial left-to-right shunt, in the baseline   state.  Event monitor 03/07/19: Study Highlights  Sinus rhythm Rare premature ventricular contractions Nonsustained ventricular tachycardia No sustained arrhythmias No atrial fibrillation Baseline artifact limits interpretation at times.   Echo 02/11/20: IMPRESSIONS     1. Technically difficult study with limited views and patient  uncooperative. Grossly normal LV systolic function. Left ventricular  ejection fraction, by estimation, is 60 to 65%. Image quality is not  sufficient to assess for regional wall motiuon  abnormalities. There is mild left ventricular hypertrophy. Left  ventricular diastolic parameters are indeterminate.   2. Right ventricle  is pooly visualized but grossly normal size with  mildly reduced systolic function.   3. The mitral valve is normal in structure. Mild mitral valve  regurgitation.  4. The aortic valve was not well visualized. Aortic valve regurgitation  is not visualized. No aortic stenosis is present.   CT ANGIOGRAPHY CHEST WITH CONTRAST   TECHNIQUE: Multidetector CT imaging of the chest was performed using the standard protocol during bolus administration of intravenous contrast. Multiplanar CT image reconstructions and MIPs were obtained to evaluate the vascular anatomy.   CONTRAST:  80mL OMNIPAQUE IOHEXOL 350 MG/ML SOLN   COMPARISON:  None.   FINDINGS: Cardiovascular: Stable mild aneurysmal disease of the proximal thoracic aorta. The aortic root measures approximately 4.0-4.1 cm at the level of the sinuses of Valsalva. The ascending thoracic aorta measures 4.2 cm in greatest estimated diameter. The proximal arch measures 3.5 cm and the distal arch 2.6 cm. The descending thoracic aorta measures 2.6 cm. No evidence of aortic dissection. Stable atherosclerosis of the arch and descending thoracic aorta.   Stable calcified plaque at the origins great vessels without significant stenosis. The heart size is stable and within normal limits. Evidence of prior CABG. No pericardial fluid. Central pulmonary arteries are normal in caliber.   Mediastinum/Nodes: No enlarged mediastinal, hilar, or axillary lymph nodes. Thyroid gland, trachea, and esophagus demonstrate no significant findings.   Lungs/Pleura: There is no evidence of pulmonary edema, consolidation, pneumothorax, nodule or pleural fluid.   Upper Abdomen: Stable probable hepatic steatosis. There may be some tiny gallstones in the visualized gallbladder.   Musculoskeletal: No chest wall abnormality. No acute or significant osseous findings.   Review of the MIP images confirms the above findings.   IMPRESSION: 1. Stable mild  aneurysmal disease of the proximal thoracic aorta with maximal estimated diameter of 4.2 cm. 2. Stable probable hepatic steatosis. 3. Possible tiny gallstones in the visualized gallbladder. 4. Aortic atherosclerosis.   Aortic aneurysm NOS (ICD10-I71.9).     Electronically Signed   By: Irish Lack M.D.   ASSESSMENT AND PLAN:  1. Thoracic aortic aneurysm: Incidental finding on a chest CT done April 2019, measuring at 43 x 42 mm. No follow up needed given overall condition   2.  CAD: Status post CABG in 2011 done in [place]. Multiple stents prior to this. No angina.      3.  Paroxysmal atrial fibrillation: well controlled on Amiodarone.  No longer on Eliquis due to terminal condition and falls.    4.  Obstructive sleep apnea: Reports intolerance of CPAP.    5.  Encephalopathy with Wernike's aphasia. related to history of Etoh use.    Disposition:   FU one year  Signed, Kanav Kazmierczak [place], MD  12/30/2023 11:04 AM    [facility] [address], [place], [place], [address] Phone [phone], Fax [phone]

## 2023-12-30 ENCOUNTER — Ambulatory Visit: Payer: Medicare Other | Attending: Cardiology | Admitting: Cardiology

## 2023-12-30 ENCOUNTER — Encounter: Payer: Self-pay | Admitting: Cardiology

## 2023-12-30 VITALS — BP 157/91 | HR 93

## 2023-12-30 DIAGNOSIS — I4819 Other persistent atrial fibrillation: Secondary | ICD-10-CM

## 2023-12-30 DIAGNOSIS — I2510 Atherosclerotic heart disease of native coronary artery without angina pectoris: Secondary | ICD-10-CM

## 2023-12-30 MED ORDER — AMIODARONE HCL 200 MG PO TABS: ORAL_TABLET | ORAL | 3 refills | Status: DC

## 2023-12-30 NOTE — Patient Instructions (Signed)
 Medication Instructions:  Continue same medications *If you need a refill on your cardiac medications before your next appointment, please call your pharmacy*  Lab Work: None ordered  Testing/Procedures: None ordered  Follow-Up: At [facility], you and your health needs are our priority.  As part of our continuing mission to provide you with exceptional heart care, our providers are all part of one team.  This team includes your primary Cardiologist (physician) and Advanced Practice Providers or APPs (Physician Assistants and Nurse Practitioners) who all work together to provide you with the care you need, when you need it.  Your next appointment:  1 year    Call in Dec to schedule March appointment     Provider:  Dr.[name]    We recommend signing up for the patient portal called "MyChart".  Sign up information is provided on this After Visit Summary.  MyChart is used to connect with patients for Virtual Visits (Telemedicine).  Patients are able to view lab/test results, encounter notes, upcoming appointments, etc.  Non-urgent messages can be sent to your provider as well.   To learn more about what you can do with MyChart, go to [email or web address].         1st Floor: - Lobby - Registration  - Pharmacy  - Lab - Cafe  2nd Floor: - PV Lab - Diagnostic Testing (echo, CT, nuclear med)  3rd Floor: - Vacant  4th Floor: - TCTS (cardiothoracic surgery) - AFib Clinic - Structural Heart Clinic - Vascular Surgery  - Vascular Ultrasound  5th Floor: - HeartCare Cardiology (general and EP) - Clinical Pharmacy for coumadin, hypertension, lipid, weight-loss medications, and med management appointments    Valet parking services will be available as well.

## 2024-01-30 ENCOUNTER — Emergency Department (HOSPITAL_COMMUNITY)

## 2024-01-30 ENCOUNTER — Emergency Department (HOSPITAL_COMMUNITY)
Admission: EM | Admit: 2024-01-30 | Discharge: 2024-01-30 | Disposition: A | Discharge status: Home / Self Care | Attending: Emergency Medicine | Admitting: Emergency Medicine

## 2024-01-30 DIAGNOSIS — I2510 Atherosclerotic heart disease of native coronary artery without angina pectoris: Secondary | ICD-10-CM | POA: Insufficient documentation

## 2024-01-30 DIAGNOSIS — I6782 Cerebral ischemia: Secondary | ICD-10-CM | POA: Diagnosis not present

## 2024-01-30 DIAGNOSIS — S0101XA Laceration without foreign body of scalp, initial encounter: Secondary | ICD-10-CM | POA: Diagnosis not present

## 2024-01-30 DIAGNOSIS — W19XXXA Unspecified fall, initial encounter: Secondary | ICD-10-CM | POA: Insufficient documentation

## 2024-01-30 DIAGNOSIS — J0100 Acute maxillary sinusitis, unspecified: Secondary | ICD-10-CM | POA: Diagnosis not present

## 2024-01-30 DIAGNOSIS — J45909 Unspecified asthma, uncomplicated: Secondary | ICD-10-CM | POA: Diagnosis not present

## 2024-01-30 DIAGNOSIS — Z7982 Long term (current) use of aspirin: Secondary | ICD-10-CM | POA: Insufficient documentation

## 2024-01-30 DIAGNOSIS — I10 Essential (primary) hypertension: Secondary | ICD-10-CM | POA: Diagnosis not present

## 2024-01-30 DIAGNOSIS — M25511 Pain in right shoulder: Secondary | ICD-10-CM | POA: Diagnosis not present

## 2024-01-30 DIAGNOSIS — S0990XA Unspecified injury of head, initial encounter: Secondary | ICD-10-CM | POA: Diagnosis not present

## 2024-01-30 DIAGNOSIS — G319 Degenerative disease of nervous system, unspecified: Secondary | ICD-10-CM | POA: Diagnosis not present

## 2024-01-30 DIAGNOSIS — J449 Chronic obstructive pulmonary disease, unspecified: Secondary | ICD-10-CM | POA: Insufficient documentation

## 2024-01-30 DIAGNOSIS — R4701 Aphasia: Secondary | ICD-10-CM | POA: Diagnosis not present

## 2024-01-30 DIAGNOSIS — Z951 Presence of aortocoronary bypass graft: Secondary | ICD-10-CM | POA: Insufficient documentation

## 2024-01-30 DIAGNOSIS — I4891 Unspecified atrial fibrillation: Secondary | ICD-10-CM | POA: Diagnosis not present

## 2024-01-30 DIAGNOSIS — Z043 Encounter for examination and observation following other accident: Secondary | ICD-10-CM | POA: Diagnosis not present

## 2024-01-30 DIAGNOSIS — S42031A Displaced fracture of lateral end of right clavicle, initial encounter for closed fracture: Secondary | ICD-10-CM | POA: Diagnosis not present

## 2024-01-30 DIAGNOSIS — R58 Hemorrhage, not elsewhere classified: Secondary | ICD-10-CM | POA: Diagnosis not present

## 2024-01-30 LAB — CBC WITH DIFFERENTIAL/PLATELET
Abs Immature Granulocytes: 0.03 10*3/uL (ref 0.00–0.07)
Basophils Absolute: 0 10*3/uL (ref 0.0–0.1)
Basophils Relative: 1 %
Eosinophils Absolute: 0.1 10*3/uL (ref 0.0–0.5)
Eosinophils Relative: 1 %
HCT: 38.2 % — ABNORMAL LOW (ref 39.0–52.0)
Hemoglobin: 13.2 g/dL (ref 13.0–17.0)
Immature Granulocytes: 0 %
Lymphocytes Relative: 24 %
Lymphs Abs: 1.7 10*3/uL (ref 0.7–4.0)
MCH: 33.5 pg (ref 26.0–34.0)
MCHC: 34.6 g/dL (ref 30.0–36.0)
MCV: 97 fL (ref 80.0–100.0)
Monocytes Absolute: 0.7 10*3/uL (ref 0.1–1.0)
Monocytes Relative: 10 %
Neutro Abs: 4.4 10*3/uL (ref 1.7–7.7)
Neutrophils Relative %: 64 %
Platelets: 229 10*3/uL (ref 150–400)
RBC: 3.94 MIL/uL — ABNORMAL LOW (ref 4.22–5.81)
RDW: 13 % (ref 11.5–15.5)
WBC: 6.9 10*3/uL (ref 4.0–10.5)
nRBC: 0 % (ref 0.0–0.2)

## 2024-01-30 LAB — COMPREHENSIVE METABOLIC PANEL WITH GFR
ALT: 17 U/L (ref 0–44)
AST: 22 U/L (ref 15–41)
Albumin: 3.5 g/dL (ref 3.5–5.0)
Alkaline Phosphatase: 66 U/L (ref 38–126)
Anion gap: 12 (ref 5–15)
BUN: 13 mg/dL (ref 8–23)
CO2: 28 mmol/L (ref 22–32)
Calcium: 9.6 mg/dL (ref 8.9–10.3)
Chloride: 102 mmol/L (ref 98–111)
Creatinine, Ser: 0.78 mg/dL (ref 0.61–1.24)
GFR, Estimated: >60 mL/min (ref >60)
Glucose, Bld: 122 mg/dL — ABNORMAL HIGH (ref 70–99)
Potassium: 3.3 mmol/L — ABNORMAL LOW (ref 3.5–5.1)
Sodium: 142 mmol/L (ref 135–145)
Total Bilirubin: 1 mg/dL (ref 0.0–1.2)
Total Protein: 6.6 g/dL (ref 6.5–8.1)

## 2024-01-30 MED ORDER — FENTANYL CITRATE PF 50 MCG/ML IJ SOSY
50.0000 ug | PREFILLED_SYRINGE | Freq: Once | INTRAMUSCULAR | Status: AC
  Administered 2024-01-30: 50 ug via INTRAVENOUS
  Filled 2024-01-30: qty 1

## 2024-01-30 MED ORDER — LIDOCAINE-EPINEPHRINE (PF) 2 %-1:200000 IJ SOLN
10.0000 mL | Freq: Once | INTRAMUSCULAR | Status: AC
  Administered 2024-01-30: 2 mL via INTRADERMAL
  Filled 2024-01-30: qty 20

## 2024-01-30 NOTE — Discharge Instructions (Signed)
 Your history, exam, and evaluation are consistent with a mechanical fall and laceration on your scalp.  The CT scans returned without evidence of significant or cranial injury and it did show that likely old right clavicle injury which we discussed.  Please follow-up with your primary team for this.  The wound was repaired with staples, please help with your primary doctor in 1 week to 10 days for removal.  Please rest and stay hydrated.  If any symptoms change or worsen acutely, please return to the nearest emergency department.

## 2024-01-30 NOTE — ED Provider Notes (Signed)
 [place] EMERGENCY DEPARTMENT AT [facility] Provider Note   CSN: [id number] Arrival date & time: 01/30/24  7829     History  Chief Complaint  Patient presents with   Adam [name] is a 73 y.o. male.  The history is provided by the patient, medical records and a significant other. The history is limited by the condition of the patient.  Fall This is a new problem. The current episode started 1 to 2 hours ago. The problem has not changed since onset.Associated symptoms include headaches. Pertinent negatives include no chest pain, no abdominal pain and no shortness of breath. Nothing aggravates the symptoms. Nothing relieves the symptoms. He has tried nothing for the symptoms. The treatment provided no relief.       Home Medications Prior to Admission medications   Medication Sig Start Date End Date Taking? Authorizing Provider  acetaminophen  (TYLENOL ) 500 MG tablet Take 500 mg by mouth every 6 (six) hours as needed for mild pain (pain score 1-3) or moderate pain (pain score 4-6).    [provider]  allopurinol  (ZYLOPRIM ) 300 MG tablet TAKE 1 TABLET(300 MG) BY MOUTH DAILY 09/13/23   [name], Marijean Shouts, MD  amiodarone  (PACERONE ) 200 MG tablet TAKE 1 TABLET(200 MG) BY MOUTH DAILY 12/30/23   [place], [name], MD  aspirin  EC 81 MG tablet Take 1 tablet (81 mg total) by mouth daily. Swallow whole. 10/10/23 10/09/24  GhimireEstil Heman, MD  cyanocobalamin  (VITAMIN B12) 500 MCG tablet Take 500 mcg by mouth daily.    [provider]  Dextromethorphan -quiNIDine (NUEDEXTA ) 20-10 MG capsule Take 1 capsule by mouth daily. 08/18/23 08/12/24  Cassandra Cleveland, MD  DULoxetine  (CYMBALTA ) 60 MG capsule Take one capsule by mouth once daily 10/26/22   [name], Marijean Shouts, MD  gabapentin  (NEURONTIN ) 300 MG capsule TAKE 2 CAPSULES BY MOUTH AT NIGHT AS NEEDED FOR RESTLESS LEGS OR SYMPTOMS 08/26/23   [name], Marijean Shouts, MD  HM LIDOCAINE  PATCH EX Apply 1 patch topically as  needed (pain).    [provider]  PREVIDENT 5000 ENAMEL PROTECT 1.1-5 % GEL  04/11/23   [provider]  SKYRIZI PEN 150 MG/ML SOAJ Inject into the skin. Takes every 3 months 10/20/21   [provider]  sodium chloride  (OCEAN) 0.65 % SOLN nasal spray Place 1 spray into both nostrils as needed for congestion.    [provider]  thiamine  (VITAMIN B-1) 100 MG tablet Take 500 mg by mouth daily.    [provider]  traMADol (ULTRAM) 50 MG tablet Take 50 mg by mouth every 6 (six) hours as needed for severe pain (pain score 7-10).    [provider]      Allergies    Amoxicillin , Augmentin  [amoxicillin -pot clavulanate], Azithromycin , Clindamycin /lincomycin, Keflex [cephalexin], and Xarelto  [rivaroxaban ]    Review of Systems   Review of Systems  Constitutional:  Negative for chills, fatigue and fever.  HENT:  Negative for congestion.   Respiratory:  Negative for cough, chest tightness and shortness of breath.   Cardiovascular:  Negative for chest pain.  Gastrointestinal:  Negative for abdominal pain, constipation, diarrhea, nausea and vomiting.  Genitourinary:  Negative for dysuria and flank pain.  Musculoskeletal:  Positive for neck pain. Negative for back pain and neck stiffness.  Skin:  Positive for wound. Negative for rash.  Neurological:  Positive for headaches. Negative for dizziness and light-headedness.  Psychiatric/Behavioral:  Negative for agitation.     Physical Exam Updated Vital  Signs BP (!) 143/88   Pulse 74   Temp 97.7 F (36.5 C) (Oral)   Resp 10   SpO2 100%  Physical Exam Vitals and nursing note reviewed.  Constitutional:      General: He is not in acute distress.    Appearance: He is well-developed. He is not ill-appearing, toxic-appearing or diaphoretic.  HENT:     Head: Laceration present.      Comments: Tenderness to right shoulder.  Otherwise intact sensation and strength in arms.    Mouth/Throat:      Pharynx: No oropharyngeal exudate or posterior oropharyngeal erythema.  Eyes:     Extraocular Movements: Extraocular movements intact.     Conjunctiva/sclera: Conjunctivae normal.     Pupils: Pupils are equal, round, and reactive to light.  Cardiovascular:     Rate and Rhythm: Normal rate and regular rhythm.     Heart sounds: No murmur heard. Pulmonary:     Effort: Pulmonary effort is normal. No respiratory distress.     Breath sounds: Normal breath sounds. No wheezing, rhonchi or rales.  Chest:     Chest wall: No tenderness.  Abdominal:     General: Abdomen is flat.     Palpations: Abdomen is soft.     Tenderness: There is no abdominal tenderness.  Musculoskeletal:        General: Tenderness present. No swelling.     Cervical back: Neck supple. No tenderness.  Skin:    General: Skin is warm and dry.     Capillary Refill: Capillary refill takes less than 2 seconds.     Findings: No erythema or rash.  Neurological:     Mental Status: He is alert. Mental status is at baseline.  Psychiatric:        Mood and Affect: Mood normal.     ED Results / Procedures / Treatments   Labs (all labs ordered are listed, but only abnormal results are displayed) Labs Reviewed  CBC WITH DIFFERENTIAL/PLATELET  COMPREHENSIVE METABOLIC PANEL WITH GFR    EKG None  Radiology DG Shoulder Right Result Date: 01/30/2024 CLINICAL DATA:  Fall.  Right shoulder pain. EXAM: RIGHT SHOULDER - 2+ VIEW COMPARISON:  None Available. FINDINGS: Distal clavicle fracture evident with some resorption of bone at the fracture site suggesting that it may be nonacute but it is new since chest x-ray 09/30/2023. no evidence for shoulder separation or dislocation. No worrisome lytic or sclerotic osseous abnormality. IMPRESSION: Distal clavicle fracture is new since chest x-ray of 09/30/2023, but resorption of bone at the fracture site suggests nonacute injury. No lesion visible in this region of the clavicle on the previous  x-ray to suggest pathologic fracture. Electronically Signed   By: Donnal Fusi M.D.   On: 01/30/2024 12:09   CT HEAD WO CONTRAST ( ) Result Date: 01/30/2024 CLINICAL DATA:  Fall. EXAM: CT HEAD WITHOUT CONTRAST CT CERVICAL SPINE WITHOUT CONTRAST TECHNIQUE: Multidetector CT imaging of the head and cervical spine was performed following the standard protocol without intravenous contrast. Multiplanar CT image reconstructions of the cervical spine were also generated. RADIATION DOSE REDUCTION: This exam was performed according to the departmental dose-optimization program which includes automated exposure control, adjustment of the mA and/or kV according to patient size and/or use of iterative reconstruction technique. COMPARISON:  09/27/2023 FINDINGS: CT HEAD FINDINGS Brain: There is no evidence for acute hemorrhage, hydrocephalus, mass lesion, or abnormal extra-axial fluid collection. No definite CT evidence for acute infarction. Diffuse loss of parenchymal volume is consistent with atrophy.  Patchy low attenuation in the deep hemispheric and periventricular white matter is nonspecific, but likely reflects chronic microvascular ischemic demyelination. Stable prominence of the lateral ventricles likely related to parenchymal atrophy. Vascular: No hyperdense vessel or unexpected calcification. Skull: No evidence for fracture. No worrisome lytic or sclerotic lesion. Sinuses/Orbits: Mucosal thickening with air-fluid level noted left maxillary sinus suggesting acute on chronic sinusitis. Mastoid air cells are clear bilaterally. Visualized portions of the globes and intraorbital fat are unremarkable. Other: None. CT CERVICAL SPINE FINDINGS Alignment: Normal. Skull base and vertebrae: No acute fracture. No primary bone lesion or focal pathologic process. Soft tissues and spinal canal: No prevertebral fluid or swelling. No visible canal hematoma. Disc levels: Loss of disc height with endplate degeneration noted C6-7.  Diffuse facet degeneration noted, right greater than left. Upper chest: Unremarkable. Other: None. IMPRESSION: 1. No acute intracranial abnormality. 2. Atrophy with chronic small vessel ischemic disease. Stable prominence of the lateral ventricles likely related to central atrophy although normal pressure hydrocephalus could have this appearance in the appropriate clinical setting. 3. Acute on chronic left maxillary sinusitis. 4. Degenerative changes in the cervical spine without acute bony abnormality. Electronically Signed   By: Donnal Fusi M.D.   On: 01/30/2024 12:07   CT Cervical Spine Wo Contrast Result Date: 01/30/2024 CLINICAL DATA:  Fall. EXAM: CT HEAD WITHOUT CONTRAST CT CERVICAL SPINE WITHOUT CONTRAST TECHNIQUE: Multidetector CT imaging of the head and cervical spine was performed following the standard protocol without intravenous contrast. Multiplanar CT image reconstructions of the cervical spine were also generated. RADIATION DOSE REDUCTION: This exam was performed according to the departmental dose-optimization program which includes automated exposure control, adjustment of the mA and/or kV according to patient size and/or use of iterative reconstruction technique. COMPARISON:  09/27/2023 FINDINGS: CT HEAD FINDINGS Brain: There is no evidence for acute hemorrhage, hydrocephalus, mass lesion, or abnormal extra-axial fluid collection. No definite CT evidence for acute infarction. Diffuse loss of parenchymal volume is consistent with atrophy. Patchy low attenuation in the deep hemispheric and periventricular white matter is nonspecific, but likely reflects chronic microvascular ischemic demyelination. Stable prominence of the lateral ventricles likely related to parenchymal atrophy. Vascular: No hyperdense vessel or unexpected calcification. Skull: No evidence for fracture. No worrisome lytic or sclerotic lesion. Sinuses/Orbits: Mucosal thickening with air-fluid level noted left maxillary sinus  suggesting acute on chronic sinusitis. Mastoid air cells are clear bilaterally. Visualized portions of the globes and intraorbital fat are unremarkable. Other: None. CT CERVICAL SPINE FINDINGS Alignment: Normal. Skull base and vertebrae: No acute fracture. No primary bone lesion or focal pathologic process. Soft tissues and spinal canal: No prevertebral fluid or swelling. No visible canal hematoma. Disc levels: Loss of disc height with endplate degeneration noted C6-7. Diffuse facet degeneration noted, right greater than left. Upper chest: Unremarkable. Other: None. IMPRESSION: 1. No acute intracranial abnormality. 2. Atrophy with chronic small vessel ischemic disease. Stable prominence of the lateral ventricles likely related to central atrophy although normal pressure hydrocephalus could have this appearance in the appropriate clinical setting. 3. Acute on chronic left maxillary sinusitis. 4. Degenerative changes in the cervical spine without acute bony abnormality. Electronically Signed   By: Donnal Fusi M.D.   On: 01/30/2024 12:07   DG Chest 2 View Result Date: 01/30/2024 CLINICAL DATA:  Fall with right shoulder pain. EXAM: CHEST - 2 VIEW COMPARISON:  09/30/2023 FINDINGS: Low volume film. The lungs are clear without focal pneumonia, edema, pneumothorax or pleural effusion. The cardio pericardial silhouette is enlarged. Median sternotomy  wires and numerous surgical clips overlie the anterior chest. No acute bony abnormality. Telemetry leads overlie the chest. IMPRESSION: Low volume film without acute cardiopulmonary findings. Electronically Signed   By: Donnal Fusi M.D.   On: 01/30/2024 12:02    Procedures .Laceration Repair  Date/Time: 01/30/2024 5:51 PM  Performed by: Craige Dixon, MD Authorized by: Craige Dixon, MD   Consent:    Consent obtained:  Verbal   Consent given by:  Patient   Risks, benefits, and alternatives were discussed: yes     Risks discussed:  Infection  and pain   Alternatives discussed:  No treatment Anesthesia:    Anesthesia method:  Local infiltration   Local anesthetic:  Lidocaine  2% WITH epi Laceration details:    Location:  Scalp   Scalp location:  Occipital   Length (cm):  3   Depth (mm):  2 Pre-procedure details:    Preparation:  Patient was prepped and draped in usual sterile fashion and imaging obtained to evaluate for foreign bodies Exploration:    Limited defect created (wound extended): no     Imaging outcome: foreign body not noted     Wound exploration: wound explored through full range of motion and entire depth of wound visualized     Contaminated: no   Treatment:    Area cleansed with:  Chlorhexidine , saline and Shur-Clens   Amount of cleaning:  Standard   Visualized foreign bodies/material removed: no   Skin repair:    Repair method:  Staples   Number of staples:  9 Approximation:    Approximation:  Close Repair type:    Repair type:  Simple Post-procedure details:    Dressing:  Antibiotic ointment and non-adherent dressing   Procedure completion:  Tolerated     Medications Ordered in ED Medications  lidocaine -EPINEPHrine  (XYLOCAINE  W/EPI) 2 %-1:200000 (PF) injection 10 mL (has no administration in time range)  fentaNYL  (SUBLIMAZE ) injection 50 mcg (has no administration in time range)  fentaNYL  (SUBLIMAZE ) injection 50 mcg (50 mcg Intravenous Given 01/30/24 1244)    ED Course/ Medical Decision Making/ A&P                                 Medical Decision Making Amount and/or Complexity of Data Reviewed Labs: ordered. Radiology: ordered.  Risk Prescription drug management.    Adam [name] is a 73 y.o. 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  Patient is able to answer questions with thumbs up  and thumbs down primarily and is communicating like that.  Patient give a thumbs up that has had some constipation but otherwise was feeling fairly at his baseline and had a fall overnight.  He was reportedly on the floor for a while until this morning.  He is reporting 10 out of 10 pain in his right shoulder, neck and head.  He is denying any other symptoms such as fevers, chills, congestion, cough, nausea, vomiting, diarrhea, or urinary changes.  He denies pain anywhere else.  Specifically no pain in his chest abdomen or back.  Patient had a laceration of some kind on his scalp and is bandaged with EMS.  On my exam, lungs clear.  Chest nontender.  Right shoulder is tender but he had symmetric strength and sensation in  arms and legs.  Symmetric smile.  Patient's wound is bandaged on his head but we will take it down after imaging to assess and repair if needed.  Appears hemostatic now.  Had a shared decision-making conversation with patient.  Given his lack of other preceding symptoms aside from some chronic constipation, we agreed to hold on initial lab testing but instead will get CT of his head, neck, shoulder, and chest x-rays.  Will give him some pain medicine.  Tetanus appears to be up-to-date per the chart.  Patient gave a thumbs up that he did not want other lab work right now.  If imaging reassuring, anticipate wound management and likely discharge home.  Anticipate reassessment after workup.   CT head and neck did not show acute fracture or skull fracture.  No cervical spine fracture.  The patient has a likely old clavicle fracture and patient gave thumbs up that he knows this is not new.  Wound was able to be cleaned and repaired with staples.  It was then had bacitracin applied and was wrapped and dressed.  Plan was to discharge patient however when I spoke to the patient's wife, she requested he get a CBC to check his blood counts due to the amount of bleeding he did earlier at  home.  Patient initially said he did not want blood work however after discussion he is amenable to getting blood work.  If it is similar to prior and reassuring, anticipate discharge home.  Patient transferred oncoming team to wait for results of labs.  Anticipate discharge.         Final Clinical Impression(s) / ED Diagnoses Final diagnoses:  Fall, initial encounter  Laceration of scalp, initial encounter  Minor head injury, initial encounter    Clinical Impression: 1. Fall, initial encounter   2. Laceration of scalp, initial encounter   3. Minor head injury, initial encounter     Disposition: Care transferred oncoming team to await reassessment of blood work.  If reassuring, anticipate discharge home   This note was prepared with assistance of Dragon voice recognition software. Occasional wrong-word or sound-a-like substitutions may have occurred due to the inherent limitations of voice recognition software.     Yael Angerer, Marine Sia, MD 01/30/24 1755

## 2024-01-30 NOTE — Progress Notes (Signed)
 [facility]- [facility] liaison note     This patient is a current hospice patient with [facility].  Please see Media tab for additional Hospice information.    Liaison will continue to follow for any discharge planning needs and to coordinate continuation of hospice care.    Please don't hesitate to call with any Hospice related questions or concerns.    Thank you for the opportunity to participate in this patient's care.  Madelene Schanz, BSN, RN, OCN [employer] [phone]

## 2024-01-30 NOTE — ED Provider Notes (Signed)
  Physical Exam  BP (!) 133/102   Pulse 96   Temp 98.2 F (36.8 C) (Oral)   Resp 19   SpO2 100%   Physical Exam  Procedures  Procedures  ED Course / MDM    Medical Decision Making Amount and/or Complexity of Data Reviewed Labs: ordered. Radiology: ordered.  Risk Prescription drug management.   Melody Spurling, assumed care for this patient.  In brief this is a 73 year old male signed out pending CBC.  Patient's hemoglobin 13.2.  It is at baseline.  He is here with family.  They are ready for discharge.  Will discharge.       Afton Horse T, DO 01/30/24 1952

## 2024-01-30 NOTE — ED Triage Notes (Addendum)
 Pt BIB [facility] from home for a mechanical  fall without thinners.  Around 7am.  On floor for a couple of hrs til wife found him. Pt has hx of fluid on brain which affects his speech.  Can give thumbs up or down.  Pt has lac to right posterior head.  Bleeding is controlled. EMS endorses that pt is on hospice care and is DNR in their system.  182/100 HR 76, 99% RA, CBG 182

## 2024-02-07 ENCOUNTER — Ambulatory Visit (INDEPENDENT_AMBULATORY_CARE_PROVIDER_SITE_OTHER): Admitting: Family Medicine

## 2024-02-07 ENCOUNTER — Encounter: Payer: Self-pay | Admitting: Family Medicine

## 2024-02-07 VITALS — BP 136/82 | HR 73 | Ht 70.0 in | Wt 168.2 lb

## 2024-02-07 DIAGNOSIS — F1010 Alcohol abuse, uncomplicated: Secondary | ICD-10-CM

## 2024-02-07 DIAGNOSIS — I10 Essential (primary) hypertension: Secondary | ICD-10-CM

## 2024-02-07 DIAGNOSIS — Z4802 Encounter for removal of sutures: Secondary | ICD-10-CM | POA: Diagnosis not present

## 2024-02-07 DIAGNOSIS — S0101XD Laceration without foreign body of scalp, subsequent encounter: Secondary | ICD-10-CM | POA: Diagnosis not present

## 2024-02-07 DIAGNOSIS — R296 Repeated falls: Secondary | ICD-10-CM

## 2024-02-07 DIAGNOSIS — G1229 Other motor neuron disease: Secondary | ICD-10-CM

## 2024-02-07 NOTE — Progress Notes (Signed)
 Established Patient Office Visit  Subjective   Patient ID: Adam [name], male    DOB: 05/13/1951  Age: 73 y.o. MRN: [id number]  Chief Complaint  Patient presents with   Follow-up    Hospial follow-up for a fall seen 4/28, staples     HPI   [name] is here for hospital follow-up.  He has history of frequent falls.  Has Wernicke encephalopathy and pseudobulbar palsy.  Currently under hospice care.  On the 28th he apparently was getting out of bed and hit against a dresser drawer and went down.  Struck right occipital area.  No reported loss of consciousness.  Fall was unobserved by wife.  Went to the ER and had multiple staples placed.  No other injuries reported.  CT head no acute findings.  CT cervical spine no fracture.  Multiple other chronic medical problems including hypertension history, history of CAD, atrial fibrillation, thoracic aortic aneurysm, COPD, obstructive sleep apnea, hyperlipidemia, type 2 diabetes.  He has lost a substantial amount of weight secondary to dysphagia from his neurologic disorder and because of that blood sugars and blood pressures have significantly improved.  He had to be taken off several medications.  Last A1c was stable.  They have had a couple of blood pressures recently up around 140 systolic but generally controlled.  Past Medical History:  Diagnosis Date   Alcohol withdrawal (HCC) 10/24/2019   Allergy    Anxiety    history of PTSD following CABG   Ascending aortic aneurysm (HCC) 01/31/2018   43 x 42 mm, pt unaware   Asthma    Cardiomegaly 10/17/2017   Colitis- colonoscopy 2014 07/13/2015   COPD (chronic obstructive pulmonary disease) (HCC)    Coronary artery disease    x 6   Depression    Diabetes mellitus without complication (HCC)    Family history of polyps in the colon    Finger dislocation    Left pinkie   GERD (gastroesophageal reflux disease)    Gout    H/O atrial fibrillation without current medication    following CABG with no  documented episodes since then.   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   Past Surgical History:  Procedure Laterality Date   ANKLE FRACTURE SURGERY Right 1991   APPENDECTOMY     ATRIAL FIBRILLATION ABLATION N/A 03/31/2018   Procedure: ATRIAL FIBRILLATION ABLATION;  Surgeon: Jolly Needle, MD;  Location: [facility] INVASIVE CV [facility];  Service: Cardiovascular;  Laterality: N/A;   CARDIAC ELECTROPHYSIOLOGY MAPPING AND ABLATION     CARDIOVERSION N/A 03/23/2021   Procedure: CARDIOVERSION;  Surgeon: Alroy Aspen Lela Purple, MD;  Location: [facility] ENDOSCOPY;  Service: Cardiovascular;  Laterality: N/A;   COLONOSCOPY  06/11/2021   COLONOSCOPY W/ BIOPSIES  2017   x7   CORONARY ANGIOPLASTY WITH STENT PLACEMENT     CORONARY ARTERY BYPASS GRAFT     FINGER SURGERY  04/2018   Small finger left hand   implantable loop recorder placement  07/02/2019   Medtronic Reveal Burien model LNQ11 (SN [id number]) implanted in office by Dr Nunzio Belch   TEE WITHOUT CARDIOVERSION N/A 03/30/2018   Procedure: TRANSESOPHAGEAL ECHOCARDIOGRAM (TEE);  Surgeon: Luana Rumple, MD;  Location: [facility] ENDOSCOPY;  Service: Cardiovascular;  Laterality: N/A;   TEE WITHOUT CARDIOVERSION N/A 03/23/2021   Procedure: TRANSESOPHAGEAL ECHOCARDIOGRAM (TEE);  Surgeon: Alroy Aspen Lela Purple, MD;  Location: [facility] ENDOSCOPY;  Service: Cardiovascular;  Laterality: N/A;   TOTAL HIP ARTHROPLASTY Left    TOTAL HIP ARTHROPLASTY Right 09/12/2018   Procedure: TOTAL HIP ARTHROPLASTY ANTERIOR APPROACH;  Surgeon: Claiborne Crew, MD;  Location: [facility] ORS;  Service: Orthopedics;  Laterality: Right;  70 mins    reports that he has never smoked. He has never used smokeless tobacco. He reports  current alcohol use of about 3.0 - 4.0 standard drinks of alcohol per week. He reports that he does not use drugs. family history includes Cancer in his father; Colon cancer in his mother; Heart disease in his paternal grandfather and paternal grandmother. Allergies  Allergen Reactions   Amoxicillin  Rash and Other (See Comments)    RASH IN GROIN AREA Has patient had a PCN reaction causing immediate rash, facial/tongue/throat swelling, SOB or lightheadedness with hypotension: no Has patient had a PCN reaction causing severe rash involving mucus membranes or skin necrosis: no Has patient had a PCN reaction that required hospitalization: no Has patient had a PCN reaction occurring within the last 10 years: yes If all of the above answers are "NO", then may proceed with Cephalosporin use.    Augmentin  [Amoxicillin -Pot Clavulanate] Rash and Other (See Comments)    RASH IN GROIN AREA   Azithromycin  Rash and Other (See Comments)    RASH IN GROIN AREA   Clindamycin /Lincomycin Rash   Keflex [Cephalexin] Rash   Xarelto  [Rivaroxaban ] Other (See Comments)    Patient stated he "ended up in the hospital with a rectal bleed"    Review of Systems  Constitutional:  Negative for malaise/fatigue.  Eyes:  Negative for blurred vision.  Respiratory:  Negative for shortness of breath.   Cardiovascular:  Negative for chest pain.  Neurological:  Negative for dizziness, weakness and headaches.      Objective:     BP 136/82 (BP Location: Left Arm, Patient Position: Sitting, Cuff Size: Normal)   Pulse 73   Ht 5\' 10"  (1.778 m)   Wt 168 lb 3.2 oz (76.3 kg)   SpO2 96%   BMI 24.13 kg/m  BP Readings from Last 3 Encounters:  02/07/24 136/82  01/30/24 119/81  12/30/23 (!) 157/91   Wt Readings from Last 3 Encounters:  02/07/24 168 lb 3.2 oz (76.3 kg)  12/12/23 169 lb 14.4 oz (77.1 kg)  11/25/23 166 lb 3.2 oz (75.4 kg)      Physical Exam Vitals reviewed.  Constitutional:      General: He is not in  acute distress.    Appearance: He is not ill-appearing.  HENT:     Head:     Comments: Laceration right parieto-occipital area.  He has several staples in place.  Had some crusted drainage over the area laceration but no fluctuance.  No purulence.  Nontender. Cardiovascular:     Rate and Rhythm: Normal rate.  Pulmonary:     Effort: Pulmonary effort is normal.     Breath sounds: Normal breath sounds.  Neurological:     Mental Status: He is alert.      No results found for any visits on 02/07/24.  Last CBC Lab Results  Component Value Date   WBC 6.9 01/30/2024   HGB 13.2 01/30/2024   HCT 38.2 (L) 01/30/2024   MCV 97.0 01/30/2024   MCH  33.5 01/30/2024   RDW 13.0 01/30/2024   PLT 229 01/30/2024   Last metabolic panel Lab Results  Component Value Date   GLUCOSE 122 (H) 01/30/2024   NA 142 01/30/2024   K 3.3 (L) 01/30/2024   CL 102 01/30/2024   CO2 28 01/30/2024   BUN 13 01/30/2024   CREATININE 0.78 01/30/2024   GFRNONAA >60 01/30/2024   CALCIUM  9.6 01/30/2024   PHOS 2.9 10/03/2023   PROT 6.6 01/30/2024   ALBUMIN 3.5 01/30/2024   LABGLOB 2.7 06/11/2022   AGRATIO 1.6 06/11/2022   BILITOT 1.0 01/30/2024   ALKPHOS 66 01/30/2024   AST 22 01/30/2024   ALT 17 01/30/2024   ANIONGAP 12 01/30/2024   Last lipids Lab Results  Component Value Date   CHOL 92 01/25/2023   HDL 38.50 (L) 01/25/2023   LDLCALC 31 01/25/2023   LDLDIRECT 124.0 06/23/2018   TRIG 115.0 01/25/2023   CHOLHDL 2 01/25/2023   Last hemoglobin A1c Lab Results  Component Value Date   HGBA1C 5.8 (A) 11/25/2023   Last thyroid  functions Lab Results  Component Value Date   TSH 2.122 07/28/2023      The ASCVD Risk score (Arnett DK, et al., 2019) failed to calculate for the following reasons:   The valid total cholesterol range is 130 to 320 mg/dL    Assessment & Plan:   #1 frequent falls.  Recent fall with laceration of the head which appears to be healing well.  CT head and neck showed no  acute abnormalities.  Staples were removed today without difficulty.  No signs of secondary infection.  Recommend gentle cleaning with shampoo along with gentle rinsing daily for the next week.  #2 progressive pseudo bulbar palsy with past history of longstanding alcohol abuse.  High risk for falls.  Currently ambulates with walker. Has had progressive decline with ongoing weight loss.  Patient currently under hospice care  #3 hypertension.  Has had some recent borderline readings at rest seated.  However, standing today obtain reading of 112/68.  Do not recommend adding back any antihypertensive medications at this time.  Glean Lamy, MD

## 2024-02-07 NOTE — Patient Instructions (Signed)
 Clean scalp gently  and daily with shampoo and water 

## 2024-02-21 ENCOUNTER — Encounter: Payer: Self-pay | Admitting: Neurology

## 2024-02-21 ENCOUNTER — Emergency Department (HOSPITAL_COMMUNITY)
Admission: EM | Admit: 2024-02-21 | Discharge: 2024-02-21 | Disposition: A | Discharge status: Home / Self Care | Attending: Emergency Medicine | Admitting: Emergency Medicine

## 2024-02-21 ENCOUNTER — Other Ambulatory Visit: Payer: Self-pay

## 2024-02-21 ENCOUNTER — Emergency Department (HOSPITAL_COMMUNITY)

## 2024-02-21 ENCOUNTER — Ambulatory Visit (INDEPENDENT_AMBULATORY_CARE_PROVIDER_SITE_OTHER): Payer: Medicare Other | Admitting: Neurology

## 2024-02-21 ENCOUNTER — Encounter (HOSPITAL_COMMUNITY): Payer: Self-pay

## 2024-02-21 VITALS — BP 165/103 | HR 71 | Ht 70.0 in | Wt 172.0 lb

## 2024-02-21 DIAGNOSIS — E512 Wernicke's encephalopathy: Secondary | ICD-10-CM | POA: Diagnosis not present

## 2024-02-21 DIAGNOSIS — R9082 White matter disease, unspecified: Secondary | ICD-10-CM | POA: Insufficient documentation

## 2024-02-21 DIAGNOSIS — I2510 Atherosclerotic heart disease of native coronary artery without angina pectoris: Secondary | ICD-10-CM | POA: Diagnosis not present

## 2024-02-21 DIAGNOSIS — W1830XA Fall on same level, unspecified, initial encounter: Secondary | ICD-10-CM | POA: Insufficient documentation

## 2024-02-21 DIAGNOSIS — I10 Essential (primary) hypertension: Secondary | ICD-10-CM | POA: Diagnosis not present

## 2024-02-21 DIAGNOSIS — G1229 Other motor neuron disease: Secondary | ICD-10-CM

## 2024-02-21 DIAGNOSIS — Z7982 Long term (current) use of aspirin: Secondary | ICD-10-CM | POA: Diagnosis not present

## 2024-02-21 DIAGNOSIS — E119 Type 2 diabetes mellitus without complications: Secondary | ICD-10-CM | POA: Diagnosis not present

## 2024-02-21 DIAGNOSIS — W19XXXA Unspecified fall, initial encounter: Secondary | ICD-10-CM | POA: Diagnosis not present

## 2024-02-21 DIAGNOSIS — R4701 Aphasia: Secondary | ICD-10-CM

## 2024-02-21 DIAGNOSIS — J449 Chronic obstructive pulmonary disease, unspecified: Secondary | ICD-10-CM | POA: Diagnosis not present

## 2024-02-21 DIAGNOSIS — R296 Repeated falls: Secondary | ICD-10-CM

## 2024-02-21 DIAGNOSIS — R1310 Dysphagia, unspecified: Secondary | ICD-10-CM | POA: Diagnosis not present

## 2024-02-21 DIAGNOSIS — S0101XA Laceration without foreign body of scalp, initial encounter: Secondary | ICD-10-CM | POA: Diagnosis not present

## 2024-02-21 DIAGNOSIS — S01511A Laceration without foreign body of lip, initial encounter: Secondary | ICD-10-CM | POA: Diagnosis not present

## 2024-02-21 DIAGNOSIS — R4182 Altered mental status, unspecified: Secondary | ICD-10-CM | POA: Diagnosis not present

## 2024-02-21 MED ORDER — LIDOCAINE HCL 2 % IJ SOLN
10.0000 mL | Freq: Once | INTRAMUSCULAR | Status: AC
  Administered 2024-02-21: 200 mg via INTRADERMAL
  Filled 2024-02-21: qty 20

## 2024-02-21 NOTE — ED Triage Notes (Signed)
 Pt bib ems from [facility]; had scheduled appointment today; as leaving office, pt stumbled on carpet, fell forward; wife with pt at time; hit face, mouth; avulsion to r upper lip; denies loc, takes asa daily, no thinners; hx wernicke encephalopathy, gate and speech issues at baseline; per wife, pt at baseline ; ems reports pt moans, grumbles, babble when trying to speak; thumbs up, thumbs down with communication; bp 192/122 at office; 152/84 with ems; P 80, 98% RA, cbg 147  Alessander Sikorski (wife) : [phone] (requesting to be notified if/when discharged)

## 2024-02-21 NOTE — ED Provider Notes (Signed)
 [facility] EMERGENCY DEPARTMENT AT [facility] Provider Note   CSN: [id number] Arrival date & time: 02/21/24  1135     History  Chief Complaint  Patient presents with   Adam [name] is a 73 y.o. male.  HPI Patient presents after a fall.  His medical history includes HTN, CAD, GERD, gout, arthritis, atrial fibrillation, HLD, alcohol abuse, Warnicke's encephalopathy, pseudobulbar palsy, COPD, DM, anemia, anxiety, depression.  He has had ongoing difficulty swallowing.  He has declined PEG tube placement multiple times.  He does continue drink alcohol.  He does have frequent falls.  He has been on hospice for the past 2 months.  He was seen by his neurologist earlier today.  While leaving the neurology office, he had a fall.  During his fall, he fell forward and struck his face.    Home Medications Prior to Admission medications   Medication Sig Start Date End Date Taking? Authorizing Provider  acetaminophen  (TYLENOL ) 500 MG tablet Take 500 mg by mouth every 6 (six) hours as needed for mild pain (pain score 1-3) or moderate pain (pain score 4-6).    [provider]  allopurinol  (ZYLOPRIM ) 300 MG tablet TAKE 1 TABLET(300 MG) BY MOUTH DAILY 09/13/23   [name], Marijean Shouts, MD  amiodarone  (PACERONE ) 200 MG tablet TAKE 1 TABLET(200 MG) BY MOUTH DAILY 12/30/23   [place], [name], MD  aspirin  EC 81 MG tablet Take 1 tablet (81 mg total) by mouth daily. Swallow whole. 10/10/23 10/09/24  Ghimire, Estil Heman, MD  ATIVAN  1 MG tablet Take 1 mg by mouth every 4 (four) hours as needed. 02/20/24   [provider]  cyanocobalamin  (VITAMIN B12) 500 MCG tablet Take 500 mcg by mouth daily.    [provider]  Dextromethorphan -quiNIDine (NUEDEXTA ) 20-10 MG capsule Take 1 capsule by mouth daily. 08/18/23 08/12/24  Camara, Amadou, MD  DULoxetine  (CYMBALTA ) 60 MG capsule Take one capsule by mouth once daily 10/26/22   [name], Marijean Shouts, MD  gabapentin  (NEURONTIN ) 300 MG  capsule TAKE 2 CAPSULES BY MOUTH AT NIGHT AS NEEDED FOR RESTLESS LEGS OR SYMPTOMS 08/26/23   [name], Marijean Shouts, MD  HM LIDOCAINE  PATCH EX Apply 1 patch topically as needed (pain).    [provider]  PREVIDENT 5000 ENAMEL PROTECT 1.1-5 % GEL  04/11/23   [provider]  SKYRIZI PEN 150 MG/ML SOAJ Inject into the skin. Takes every 3 months 10/20/21   [provider]  sodium chloride  (OCEAN) 0.65 % SOLN nasal spray Place 1 spray into both nostrils as needed for congestion.    [provider]  thiamine  (VITAMIN B-1) 100 MG tablet Take 500 mg by mouth daily.    [provider]  traMADol (ULTRAM) 50 MG tablet Take 50 mg by mouth every 6 (six) hours as needed for severe pain (pain score 7-10).    [provider]      Allergies    Amoxicillin , Augmentin  [amoxicillin -pot clavulanate], Azithromycin , Clindamycin /lincomycin, Keflex [cephalexin], and Xarelto  [rivaroxaban ]    Review of Systems   Review of Systems  Unable to perform ROS: Patient nonverbal    Physical Exam Updated Vital Signs BP (!) 171/87 (BP Location: Right Arm)   Pulse 73   Temp 98.1 F (36.7 C) (Axillary)   Resp 19   SpO2 100%  Physical Exam Vitals and nursing note reviewed.  Constitutional:      General: He is not in acute distress.    Appearance: Normal appearance.  He is well-developed. He is not ill-appearing, toxic-appearing or diaphoretic.  HENT:     Head: Normocephalic and atraumatic.     Right Ear: External ear normal.     Left Ear: External ear normal.     Nose: Nose normal.     Mouth/Throat:     Comments: Laceration to mucosal surface of upper lip.  Coagulated blood present in oral cavity.  No appreciable laxity of teeth.  No other lacerations identified. Eyes:     Extraocular Movements: Extraocular movements intact.     Conjunctiva/sclera: Conjunctivae normal.  Cardiovascular:     Rate and Rhythm: Normal rate and regular rhythm.  Pulmonary:     Effort:  Pulmonary effort is normal. No respiratory distress.  Chest:     Chest wall: No tenderness.  Abdominal:     General: There is no distension.     Palpations: Abdomen is soft.     Tenderness: There is no abdominal tenderness.  Musculoskeletal:        General: No swelling.     Cervical back: Neck supple.  Skin:    General: Skin is warm and dry.     Coloration: Skin is not jaundiced or pale.  Neurological:     Mental Status: He is alert.     Comments: Nonverbal at baseline.  Only able to grunt.  Appears to be alert and oriented.  Able to answer yes or no questions with thumbs up/thumbs [name].  Psychiatric:        Mood and Affect: Mood normal.        Behavior: Behavior normal.     ED Results / Procedures / Treatments   Labs (all labs ordered are listed, but only abnormal results are displayed) Labs Reviewed - No data to display  EKG None  Radiology CT Head Wo Contrast Result Date: 02/21/2024 CLINICAL DATA:  Trauma EXAM: CT HEAD WITHOUT CONTRAST CT MAXILLOFACIAL WITHOUT CONTRAST CT CERVICAL SPINE WITHOUT CONTRAST TECHNIQUE: Multidetector CT imaging of the head, cervical spine, and maxillofacial structures were performed using the standard protocol without intravenous contrast. Multiplanar CT image reconstructions of the cervical spine and maxillofacial structures were also generated. RADIATION DOSE REDUCTION: This exam was performed according to the departmental dose-optimization program which includes automated exposure control, adjustment of the mA and/or kV according to patient size and/or use of iterative reconstruction technique. COMPARISON:  01/30/2024 FINDINGS: CT HEAD FINDINGS Brain: No evidence of acute infarction, hemorrhage, hydrocephalus, extra-axial collection or mass lesion/mass effect. Mild periventricular white matter hypodensity. Vascular: No hyperdense vessel or unexpected calcification. CT FACIAL BONES FINDINGS Skull: Normal. Negative for fracture or focal lesion. Facial  bones: No displaced fractures or dislocations. Sinuses/Orbits: Chronic bony sinus wall thickening of the left maxillary sinus with associated mucosal thickening and a small air-fluid level, as seen on prior examination. No overlying fracture. Other: Soft tissue laceration of the right parietal scalp (series 3, image 24). CT CERVICAL SPINE FINDINGS Alignment: Normal. Skull base and vertebrae: No acute fracture. No primary bone lesion or focal pathologic process. Soft tissues and spinal canal: No prevertebral fluid or swelling. No visible canal hematoma. Disc levels: Moderate multilevel cervical disc degenerative of the cervical spine, worst from C5-C7. Upper chest: Negative. Other: Subacute fracture of the distal right clavicle, partially imaged. IMPRESSION: 1. No acute intracranial pathology. Small-vessel white matter disease. 2. No displaced fractures or dislocations of the facial bones. 3. Chronic bony sinus wall thickening of the left maxillary sinus with associated mucosal thickening and a small air-fluid level,  as seen on prior examination. No overlying fracture. Correlate for sinusitis. 4. Soft tissue laceration of the right parietal scalp. 5. No fracture or static subluxation of the cervical spine. 6. Moderate multilevel cervical disc degenerative disease, worst from C5-C7. 7. Subacute fracture of the distal right clavicle, partially imaged. Electronically Signed   By: Fredricka Jenny M.D.   On: 02/21/2024 15:08   CT Maxillofacial Wo Contrast Result Date: 02/21/2024 CLINICAL DATA:  Trauma EXAM: CT HEAD WITHOUT CONTRAST CT MAXILLOFACIAL WITHOUT CONTRAST CT CERVICAL SPINE WITHOUT CONTRAST TECHNIQUE: Multidetector CT imaging of the head, cervical spine, and maxillofacial structures were performed using the standard protocol without intravenous contrast. Multiplanar CT image reconstructions of the cervical spine and maxillofacial structures were also generated. RADIATION DOSE REDUCTION: This exam was performed  according to the departmental dose-optimization program which includes automated exposure control, adjustment of the mA and/or kV according to patient size and/or use of iterative reconstruction technique. COMPARISON:  01/30/2024 FINDINGS: CT HEAD FINDINGS Brain: No evidence of acute infarction, hemorrhage, hydrocephalus, extra-axial collection or mass lesion/mass effect. Mild periventricular white matter hypodensity. Vascular: No hyperdense vessel or unexpected calcification. CT FACIAL BONES FINDINGS Skull: Normal. Negative for fracture or focal lesion. Facial bones: No displaced fractures or dislocations. Sinuses/Orbits: Chronic bony sinus wall thickening of the left maxillary sinus with associated mucosal thickening and a small air-fluid level, as seen on prior examination. No overlying fracture. Other: Soft tissue laceration of the right parietal scalp (series 3, image 24). CT CERVICAL SPINE FINDINGS Alignment: Normal. Skull base and vertebrae: No acute fracture. No primary bone lesion or focal pathologic process. Soft tissues and spinal canal: No prevertebral fluid or swelling. No visible canal hematoma. Disc levels: Moderate multilevel cervical disc degenerative of the cervical spine, worst from C5-C7. Upper chest: Negative. Other: Subacute fracture of the distal right clavicle, partially imaged. IMPRESSION: 1. No acute intracranial pathology. Small-vessel white matter disease. 2. No displaced fractures or dislocations of the facial bones. 3. Chronic bony sinus wall thickening of the left maxillary sinus with associated mucosal thickening and a small air-fluid level, as seen on prior examination. No overlying fracture. Correlate for sinusitis. 4. Soft tissue laceration of the right parietal scalp. 5. No fracture or static subluxation of the cervical spine. 6. Moderate multilevel cervical disc degenerative disease, worst from C5-C7. 7. Subacute fracture of the distal right clavicle, partially imaged.  Electronically Signed   By: Fredricka Jenny M.D.   On: 02/21/2024 15:08   CT Cervical Spine Wo Contrast Result Date: 02/21/2024 CLINICAL DATA:  Trauma EXAM: CT HEAD WITHOUT CONTRAST CT MAXILLOFACIAL WITHOUT CONTRAST CT CERVICAL SPINE WITHOUT CONTRAST TECHNIQUE: Multidetector CT imaging of the head, cervical spine, and maxillofacial structures were performed using the standard protocol without intravenous contrast. Multiplanar CT image reconstructions of the cervical spine and maxillofacial structures were also generated. RADIATION DOSE REDUCTION: This exam was performed according to the departmental dose-optimization program which includes automated exposure control, adjustment of the mA and/or kV according to patient size and/or use of iterative reconstruction technique. COMPARISON:  01/30/2024 FINDINGS: CT HEAD FINDINGS Brain: No evidence of acute infarction, hemorrhage, hydrocephalus, extra-axial collection or mass lesion/mass effect. Mild periventricular white matter hypodensity. Vascular: No hyperdense vessel or unexpected calcification. CT FACIAL BONES FINDINGS Skull: Normal. Negative for fracture or focal lesion. Facial bones: No displaced fractures or dislocations. Sinuses/Orbits: Chronic bony sinus wall thickening of the left maxillary sinus with associated mucosal thickening and a small air-fluid level, as seen on prior examination. No overlying fracture. Other: Soft  tissue laceration of the right parietal scalp (series 3, image 24). CT CERVICAL SPINE FINDINGS Alignment: Normal. Skull base and vertebrae: No acute fracture. No primary bone lesion or focal pathologic process. Soft tissues and spinal canal: No prevertebral fluid or swelling. No visible canal hematoma. Disc levels: Moderate multilevel cervical disc degenerative of the cervical spine, worst from C5-C7. Upper chest: Negative. Other: Subacute fracture of the distal right clavicle, partially imaged. IMPRESSION: 1. No acute intracranial pathology.  Small-vessel white matter disease. 2. No displaced fractures or dislocations of the facial bones. 3. Chronic bony sinus wall thickening of the left maxillary sinus with associated mucosal thickening and a small air-fluid level, as seen on prior examination. No overlying fracture. Correlate for sinusitis. 4. Soft tissue laceration of the right parietal scalp. 5. No fracture or static subluxation of the cervical spine. 6. Moderate multilevel cervical disc degenerative disease, worst from C5-C7. 7. Subacute fracture of the distal right clavicle, partially imaged. Electronically Signed   By: Fredricka Jenny M.D.   On: 02/21/2024 15:08    Procedures .Laceration Repair  Date/Time: 02/21/2024 1:23 PM  Performed by: Iva Mariner, MD Authorized by: Iva Mariner, MD   Consent:    Consent obtained:  Verbal   Consent given by:  Patient   Risks, benefits, and alternatives were discussed: yes   Universal protocol:    Procedure explained and questions answered to patient or proxy's satisfaction: yes     Patient identity confirmed:  Arm band Anesthesia:    Anesthesia method:  Local infiltration   Local anesthetic:  Lidocaine  2% WITH epi Laceration details:    Location:  Lip   Lip location:  Upper interior lip   Length (cm):  2   Depth (mm):  3 Skin repair:    Repair method:  Sutures   Suture size:  5-0   Suture material:  Fast-absorbing gut   Number of sutures:  4 Approximation:    Approximation:  Close   Vermilion border well-aligned: yes   Repair type:    Repair type:  Simple Post-procedure details:    Dressing:  Open (no dressing)   Procedure completion:  Tolerated well, no immediate complications     Medications Ordered in ED Medications  lidocaine  (XYLOCAINE ) 2 % (with pres) injection 200 mg (200 mg Intradermal Given by Other 02/21/24 1215)    ED Course/ Medical Decision Making/ A&P                                 Medical Decision Making Amount and/or Complexity of Data  Reviewed Radiology: ordered.  Risk Prescription drug management.   Patient presents after a ground-level fall.  This occurred as he was leaving his neurology office.  During this fall, he did strike his face.  On arrival in the ED, he has a hemostatic laceration to the mucosal surface of his upper lip.  There is a small amount of dried blood in his oral cavity.  No other intraoral injuries are identified.  There is no appreciable laxity to his front teeth.  Although patient is nonverbal at baseline, he does appear alert and oriented.  He is able to answer questions appropriately with thumbs up/thumbs [name].  He denies any other areas of discomfort.  Although patient has been prescribed Xarelto  in the past for atrial fibrillation, it does not appear that he takes this anymore.  Given that he did strike his face, will obtain CT  imaging of head, face, and cervical spine.  Patient underwent laceration repair, as per procedure note above.  CT imaging did not show any acute osseous injuries.  I spoke with his wife over telephone who will come to pick him up.  I also informed [facility], who will place patient back on their service.  Patient was discharged in stable condition.        Final Clinical Impression(s) / ED Diagnoses Final diagnoses:  Fall, initial encounter  Lip laceration, initial encounter    Rx / DC Orders ED Discharge Orders     None         Iva Mariner, MD 02/21/24 [phone]

## 2024-02-21 NOTE — ED Provider Triage Note (Signed)
 Emergency Medicine Provider Triage Evaluation Note  Adam [name] , a 73 y.o. male  was evaluated in triage.  Pt complains of fall.  Review of Systems  Positive: Lip laceration Negative: (Patient nonverbal)  Physical Exam  BP (!) 171/87 (BP Location: Right Arm)   Pulse 73   Temp 98.1 F (36.7 C) (Axillary)   Resp 19   SpO2 100%  Gen:   Awake, no distress   Resp:  Normal effort  MSK:   Moves extremities without difficulty  Other:  Hemostatic laceration to upper lip.  Medical Decision Making  Medically screening exam initiated at 11:47 AM.  Appropriate orders placed.  Adam [name] was informed that the remainder of the evaluation will be completed by another provider, this initial triage assessment does not replace that evaluation, and the importance of remaining in the ED until their evaluation is complete.  Patient presents after a fall.  This is a frequent occurrence for him.  He was seen at the neurology office prior to arrival.  On his way out of the office, he had a fall forward and struck his face.  Patient communicates with thumbs up and thumbs down.  He denies any areas of pain below the neck.   Iva Mariner, MD 02/21/24 [phone]

## 2024-02-21 NOTE — Discharge Instructions (Addendum)
 Your imaging study showed no major injuries.  You received 4 absorbable stitches in your upper lip.  These should go away on their own.  If you have any recurrent bleeding, simply hold pressure to the area.  Return to the emergency department for any new or worsening symptoms of concern.

## 2024-02-21 NOTE — Progress Notes (Signed)
 [facility] liaison note     This patient is a current hospice patient with [facility]. Please see media tab for detailed hospice report.    Liaison will continue to follow for any discharge planning needs and to coordinate continuation of hospice care.    Please don't hesitate to call with any Hospice related questions or concerns.    Thank you for the opportunity to participate in this patient's care.  Madelene Schanz, BSN, RN, OCN [employer] [phone]

## 2024-02-21 NOTE — Patient Instructions (Signed)
 Continue current medications  Continue with hospice care  Return as needed

## 2024-02-21 NOTE — Progress Notes (Signed)
 [facility]  PATIENT: Adam [name] DOB: [birth year]  REFERRING CLINICIAN: Marquetta Sit, MD HISTORY FROM: Patient and spouse [address] REASON FOR VISIT: Slurred speech/ Balance issues and multiple falls.    HISTORICAL  CHIEF COMPLAINT:  Chief Complaint  Patient presents with   Follow-up    Rm12, sister present, Wernicke encephalopathy Pseudobulbar palsy Aphasia Mute Recurrent falls Dysphagia: started hospice home care 2 months ago, gait issues:uses walker to assist with mobility and balance. Difficulty swallowing: constant drooling, fine motor skills issues, eyes locking up     INTERVAL HISTORY 02/21/2024:  Adam [name] presents today for follow-up, he is accompanied by wife.  Last visit was in November, at that time we referred him to speech therapy and also obtained a modified barium swallow evaluation which he failed.  Again he was recommended PEG tube placement by multiple providers but he declined.  Wife tells me that he is able to sip water , and able to drink at least 3 Ensure daily.  He continues to falls, had multiple ED visit in December, March and April, resulting in laceration, and a broken clavicle.  Again explained to him that due to his poor oral poor oral intake, he most likely will not be a candidate for shunt placement for his NPH.  Again he refuses to proceed with the PEG placement.  Wife tells me that he stopped drinking Alcohol, his last drink was in December 2024.   INTERVAL HISTORY 08/17/2023: Patient presents today for follow-up, he is accompanied by wife.  Last visit was over 2 years ago.  Since then, wife tells me that his condition is worse.  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  He is able to understand and communicate with writing and gesture, sometimes gets frustrated when wife does not understand him.  He is still having gait abnormality.  Even with the walker, he continued to fall.  He continues to drink alcohol daily, even though he  decreased the amount of alcohol.  Wife tells me that he has lost a lot of weight almost 100 pounds due to the fact that he is choking, he cannot swallow, he drools all the time.  They tell me that they have not seen GI or have any new swallow study or endoscopy done. He is still have sign of his Wernicke including pseudobulbar affect    HISTORY OF PRESENT ILLNESS:  This is a 73 year old gentleman with a history of chronic alcohol abuse, COPD, heart disease, gout, depression, diabetes mellitus and, hyperlipidemia and recently diagnosed with Wernicke encephalopathy last year, and after discharged, was in rehab for 2-month and has been home since December 2021.  Patient said that since being home, he has been having multiple falls, had difficulty with speech, that he described as slurred speech said that he follow-up with his primary care doctor has carotid checked, was told they were fine.  He does complain of incontinence and constipation.  He also reported history of hallucination, on 2 occasions. The first one, he was talking to his brother and the second episode he was talking to his nephew and they were both not in the house.  Patient reported that he is more forgetful, he continued to fall, last fall was a week ago.  Patient also reported he continued to drink, he drinks heavily and daily,, has a eye-opening in the morning and now said that drinking has restarted since being home after his rehab stay.  He has not cut down on his  alcohol intake.  He is concerned that he has Lewy body dementia   OTHER MEDICAL CONDITIONS: Chronic alcohol, COPD, Heart disease, DM, Gout, Depression, HLD,    REVIEW OF SYSTEMS: Full 14 system review of systems performed and negative with exception of: as noted in the HPI   ALLERGIES: Allergies  Allergen Reactions   Amoxicillin  Rash and Other (See Comments)    RASH IN GROIN AREA Has patient had a PCN reaction causing immediate rash, facial/tongue/throat swelling, SOB or  lightheadedness with hypotension: no Has patient had a PCN reaction causing severe rash involving mucus membranes or skin necrosis: no Has patient had a PCN reaction that required hospitalization: no Has patient had a PCN reaction occurring within the last 10 years: yes If all of the above answers are "NO", then may proceed with Cephalosporin use.    Augmentin  [Amoxicillin -Pot Clavulanate] Rash and Other (See Comments)    RASH IN GROIN AREA   Azithromycin  Rash and Other (See Comments)    RASH IN GROIN AREA   Clindamycin /Lincomycin Rash   Keflex [Cephalexin] Rash   Xarelto  [Rivaroxaban ] Other (See Comments)    Patient stated he "ended up in the hospital with a rectal bleed"    HOME MEDICATIONS: Outpatient Medications Prior to Visit  Medication Sig Dispense Refill   acetaminophen  (TYLENOL ) 500 MG tablet Take 500 mg by mouth every 6 (six) hours as needed for mild pain (pain score 1-3) or moderate pain (pain score 4-6).     allopurinol  (ZYLOPRIM ) 300 MG tablet TAKE 1 TABLET(300 MG) BY MOUTH DAILY 90 tablet 0   amiodarone  (PACERONE ) 200 MG tablet TAKE 1 TABLET(200 MG) BY MOUTH DAILY 90 tablet 3   aspirin  EC 81 MG tablet Take 1 tablet (81 mg total) by mouth daily. Swallow whole.     ATIVAN  1 MG tablet Take 1 mg by mouth every 4 (four) hours as needed.     cyanocobalamin  (VITAMIN B12) 500 MCG tablet Take 500 mcg by mouth daily.     Dextromethorphan -quiNIDine (NUEDEXTA ) 20-10 MG capsule Take 1 capsule by mouth daily. 30 capsule 11   DULoxetine  (CYMBALTA ) 60 MG capsule Take one capsule by mouth once daily 90 capsule 3   gabapentin  (NEURONTIN ) 300 MG capsule TAKE 2 CAPSULES BY MOUTH AT NIGHT AS NEEDED FOR RESTLESS LEGS OR SYMPTOMS 180 capsule 3   HM LIDOCAINE  PATCH EX Apply 1 patch topically as needed (pain).     PREVIDENT 5000 ENAMEL PROTECT 1.1-5 % GEL      SKYRIZI PEN 150 MG/ML SOAJ Inject into the skin. Takes every 3 months     sodium chloride  (OCEAN) 0.65 % SOLN nasal spray Place 1 spray  into both nostrils as needed for congestion.     thiamine  (VITAMIN B-1) 100 MG tablet Take 500 mg by mouth daily.     traMADol (ULTRAM) 50 MG tablet Take 50 mg by mouth every 6 (six) hours as needed for severe pain (pain score 7-10).     No facility-administered medications prior to visit.    PAST MEDICAL HISTORY: Past Medical History:  Diagnosis Date   Alcohol withdrawal (HCC) 10/24/2019   Allergy    Anxiety    history of PTSD following CABG   Ascending aortic aneurysm (HCC) 01/31/2018   43 x 42 mm, pt unaware   Asthma    Cardiomegaly 10/17/2017   Colitis- colonoscopy 2014 07/13/2015   COPD (chronic obstructive pulmonary disease) (HCC)    Coronary artery disease    x 6   Depression  Diabetes mellitus without complication (HCC)    Family history of polyps in the colon    Finger dislocation    Left pinkie   GERD (gastroesophageal reflux disease)    Gout    H/O atrial fibrillation without current medication    following CABG with no documented episodes since then.   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    PAST SURGICAL HISTORY: Past Surgical History:  Procedure Laterality Date   ANKLE FRACTURE SURGERY Right 1991   APPENDECTOMY     ATRIAL FIBRILLATION ABLATION N/A 03/31/2018   Procedure: ATRIAL FIBRILLATION ABLATION;  Surgeon: Jolly Needle, MD;  Location: [facility] INVASIVE CV LAB;  Service: Cardiovascular;  Laterality: N/A;   CARDIAC ELECTROPHYSIOLOGY MAPPING AND ABLATION     CARDIOVERSION N/A 03/23/2021   Procedure: CARDIOVERSION;  Surgeon: Alroy Aspen Lela Purple, MD;  Location: [facility] ENDOSCOPY;  Service: Cardiovascular;  Laterality: N/A;   COLONOSCOPY  06/11/2021   COLONOSCOPY W/  BIOPSIES  2017   x7   CORONARY ANGIOPLASTY WITH STENT PLACEMENT     CORONARY ARTERY BYPASS GRAFT     FINGER SURGERY  04/2018   Small finger left hand   implantable loop recorder placement  07/02/2019   Medtronic Reveal [place] model LNQ11 (SN [id number]) implanted in office by Dr Nunzio Belch   TEE WITHOUT CARDIOVERSION N/A 03/30/2018   Procedure: TRANSESOPHAGEAL ECHOCARDIOGRAM (TEE);  Surgeon: Luana Rumple, MD;  Location: [facility] ENDOSCOPY;  Service: Cardiovascular;  Laterality: N/A;   TEE WITHOUT CARDIOVERSION N/A 03/23/2021   Procedure: TRANSESOPHAGEAL ECHOCARDIOGRAM (TEE);  Surgeon: Alroy Aspen Lela Purple, MD;  Location: [facility] ENDOSCOPY;  Service: Cardiovascular;  Laterality: N/A;   TOTAL HIP ARTHROPLASTY Left    TOTAL HIP ARTHROPLASTY Right 09/12/2018   Procedure: TOTAL HIP ARTHROPLASTY ANTERIOR APPROACH;  Surgeon: Claiborne Crew, MD;  Location: [facility] ORS;  Service: Orthopedics;  Laterality: Right;  70 mins    FAMILY HISTORY: Family History  Problem Relation Age of Onset   Colon cancer Mother    Cancer Father        UNKNOWN TYPE   Heart disease Paternal Grandmother    Heart disease Paternal Grandfather    Esophageal cancer Neg Hx    Rectal cancer Neg Hx    Stomach cancer Neg Hx     SOCIAL HISTORY: Social History   Socioeconomic History   Marital status: Married    Spouse name: Not on file   Number of children: 0   Years of education: Not on file   Highest education level: Not on file  Occupational History   Occupation: retired  Tobacco Use   Smoking status: Never   Smokeless tobacco: Never  Vaping Use   Vaping status: Never Used  Substance and Sexual Activity   Alcohol use: Yes    Alcohol/week: 3.0 - 4.0 standard drinks of alcohol    Types: 1 Glasses of wine, 2 - 3 Shots of liquor per week    Comment: 4 drinks daily 01/26/23   Drug use: No    Comment: Smoked Marijuana back in 1970s   Sexual activity: Yes  Other Topics Concern   Not on file  Social History Narrative   **  Merged  History Encounter **       Married, 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 - returned to [employer] from [employer]  2014 Enjoys bird watching, reading historical books about WWII, watching tv   Social Drivers of Corporate investment banker Strain: Low Risk  (12/12/2023)   Overall Financial Resource Strain (CARDIA)    Difficulty of Paying Living Expenses: Not hard at all  Food Insecurity: No Food Insecurity (12/12/2023)   Hunger Vital Sign    Worried About Running Out of Food in the Last Year: Never true    Ran Out of Food in the Last Year: Never true  Transportation Needs: No Transportation Needs (12/12/2023)   PRAPARE - Administrator, Civil Service (Medical): No    Lack of Transportation (Non-Medical): No  Physical Activity: Inactive (12/12/2023)   Exercise Vital Sign    Days of Exercise per Week: 0 days    Minutes of Exercise per Session: 0 min  Stress: No Stress Concern Present (12/12/2023)   [employer] of Occupational Health - Occupational Stress Questionnaire    Feeling of Stress : Not at all  Social Connections: Moderately Isolated (12/12/2023)   Social Connection and Isolation Panel [NHANES]    Frequency of Communication with Friends and Family: More than three times a week    Frequency of Social Gatherings with Friends and Family: More than three times a week    Attends Religious Services: Never    Database administrator or Organizations: No    Attends Banker Meetings: Never    Marital Status: Married  Catering manager Violence: Not At Risk (12/12/2023)   Humiliation, Afraid, Rape, and Kick questionnaire    Fear of Current or Ex-Partner: No    Emotionally Abused: No    Physically Abused: No    Sexually Abused: No     PHYSICAL EXAM  GENERAL EXAM/CONSTITUTIONAL: Vitals:  Vitals:   02/21/24 1000 02/21/24 1004  BP: (!) 159/89 (!) 165/103  Pulse:  71  Weight:  172 lb (78 kg)  Height:  5\' 10"  (1.778 m)     Body mass index is 24.68 kg/m. Wt Readings from Last 3 Encounters:  02/21/24 172 lb (78 kg)  02/07/24 168 lb 3.2 oz (76.3 kg)  12/12/23 169 lb 14.4 oz (77.1 kg)   Patient is in no distress; well developed, nourished and groomed; neck is supple. Mouth is open, communicate with gestures and writing on notepad  MUSCULOSKELETAL: Gait, strength, tone, movements noted in Neurologic exam below  NEUROLOGIC: MENTAL STATUS:     06/26/2018   11:32 AM 06/08/2017   11:50 AM  MMSE - Mini Mental State Exam  Not completed: -- --   awake, alert, able to follow command and answer appropriately  Mute but comprehension intact Able to communicate with gesture and writing on a notepad   CRANIAL NERVE:  2nd, 3rd, 4th, 6th -  visual fields full to confrontation, limited EOMI, mainly vertical movement. Unable to close eyes on commands but will blink to thread 7th - Limited facial movement 8th - hearing intact 9th - palate elevates symmetrically, uvula midline 11th - shoulder shrug symmetric  MOTOR:  normal bulk and tone, full strength in the BUE, BLE  SENSORY:  normal and symmetric to light touch  COORDINATION:  Mild tremors noted on extension of both arms.   GAIT/STATION:  Walks with a rolling walker, needs help with standing.  DIAGNOSTIC DATA (LABS, IMAGING, TESTING) - I reviewed patient records, labs, notes, testing and imaging myself where available.  Lab Results  Component Value Date   WBC 6.9 01/30/2024   HGB 13.2 01/30/2024   HCT 38.2 (L) 01/30/2024   MCV 97.0 01/30/2024   PLT 229 01/30/2024      Component Value Date/Time   NA 142 01/30/2024 1754   NA 143 06/11/2022 0920   K 3.3 (L) 01/30/2024 1754   CL 102 01/30/2024 1754   CO2 28 01/30/2024 1754   GLUCOSE 122 (H) 01/30/2024 1754   BUN 13 01/30/2024 1754   BUN 13 06/11/2022 0920   CREATININE 0.78 01/30/2024 1754   CREATININE 0.93 09/23/2020 1216   CALCIUM  9.6 01/30/2024 1754   PROT 6.6 01/30/2024 1754   PROT  7.1 06/11/2022 0920   ALBUMIN 3.5 01/30/2024 1754   ALBUMIN 4.4 06/11/2022 0920   AST 22 01/30/2024 1754   ALT 17 01/30/2024 1754   ALKPHOS 66 01/30/2024 1754   BILITOT 1.0 01/30/2024 1754   BILITOT 0.7 06/11/2022 0920   GFRNONAA >60 01/30/2024 1754   GFRAA 115 08/22/2020 0921   Lab Results  Component Value Date   CHOL 92 01/25/2023   HDL 38.50 (L) 01/25/2023   LDLCALC 31 01/25/2023   LDLDIRECT 124.0 06/23/2018   TRIG 115.0 01/25/2023   CHOLHDL 2 01/25/2023   Lab Results  Component Value Date   HGBA1C 5.8 (A) 11/25/2023   Lab Results  Component Value Date   VITAMINB12 400 09/30/2023   Lab Results  Component Value Date   TSH 2.122 07/28/2023   CT Head 01/30/2024 1. No acute intracranial abnormality. 2. Atrophy with chronic small vessel ischemic disease. Stable prominence of the lateral ventricles likely related to central atrophy although normal pressure hydrocephalus could have this appearance in the appropriate clinical setting.   MRI Brain  No evidence of acute infarction. Moderate generalized parenchymal atrophy, stable as compared to the brain MRI of 02/10/2020. Ventriculomegaly is also unchanged and favored secondary to global atrophy. However, as before a component of normal pressure hydrocephalus is difficult to exclude and clinical correlation is recommended. Stable mild chronic small vessel ischemic changes within the cerebral white matter and pons   Swallow evaluation 02/2020 Pt presents with mild pharyngeal dysphagia characterized by a pharyngeal delay which resulted in penetration (PAS 3) of thin liquids via straw when consecutive swallows of used. Aspiration was not observed during the study but aspiration of penetrated material is likely. A regular texture diet with thin liquids is recommended at this time with observance of swallowing precautions. SLP will follow for dysphagia treatment.    DG Swallow func test 09/13/2023 Clinical Impression: Patient with  a profoundly impaired oral phase of swallow as per this modified barium swallow study. No bilabial, lingual or other oral motor musculature movement was observed. SLP presented boluses of nectar thick liquids and puree solids into patient's oral cavity and boluses remained in anterior portion of oral cavity, requiring oral suction to remove. Patient unable to verbally communicate but was able to write and spouse confirmed what he wrote when doing so. He wrote that "I can swallow it is dificult but I can" and when asked how he swallows he wrote "lay flat on bed (pills and yogurt)" After two failed attempts to swallow barium, no further boluses attempted. SLP educated patient and spouse regarding this test and that SLP is unable to make any PO recommendations. Patient gestured to indicate he did not want a feeding  tube but then wrote "What is involved w/feeding tube?" and asking "no taste?" He did not seem to adequately understand the gravity of his dysphagia but his wife indicated that he likely would use a feeding tube for purposes of ingesting alcohol. SLP recommended that patient and spouse have a serious discussion regarding GOC with his PCP and medical care team.     ASSESSMENT AND PLAN  73 y.o. year old male with chronic alcoholism,  Wernicke's encephalopathy, pseudobulbar affect, hypertension, hyperlipidemia, depression, gout who is presenting for follow-up.  Last visit was in November, at that time we have recommended speech therapy and obtained a modified barium study which was abnormal, he failed the study (report above).  Again he was recommended by multiple providers for PEG tube placement but patient declined.  Currently he is on hospice.  I have explained to patient and wife that as long as his having poor oral intake, with his multiple falls, he likely will not be a candidate for shunt placement.  Again we discussed PEG tube placement and he declined.  Plan for now is for patient to continue  following up with his doctors, following up with hospice care and return as needed.    1. Dysphagia, unspecified type   2. Aphasia   3. Wernicke encephalopathy   4. Pseudobulbar palsy (HCC)   5. Recurrent falls   6. Mute       Patient Instructions  Continue current medications  Continue with hospice care  Return as needed      No orders of the defined types were placed in this encounter.   No orders of the defined types were placed in this encounter.    Return if symptoms worsen or fail to improve.   ADDENDUM: Patient fell on his way out, he was using his rolling walker but fell face forward and had a large upper lip laceration, no LOC, he was able to answer appropriately. EMS was called and patient taken to the ED.   I have spent a total of 50 minutes dedicated to this patient today, preparing to see patient, performing a medically appropriate examination and evaluation, ordering tests and/or medications and procedures, and counseling and educating the patient/family/caregiver; independently interpreting result and communicating results to the family/patient/caregiver; and documenting clinical information in the electronic medical record.   Cassandra Cleveland, MD 02/21/2024, 11:13 AM  [facility] [address] [place], [place] [address] [phone]

## 2024-02-22 ENCOUNTER — Ambulatory Visit (INDEPENDENT_AMBULATORY_CARE_PROVIDER_SITE_OTHER): Payer: Medicare Other | Admitting: Family Medicine

## 2024-02-22 VITALS — BP 120/68 | HR 90 | Wt 168.5 lb

## 2024-02-22 DIAGNOSIS — E512 Wernicke's encephalopathy: Secondary | ICD-10-CM | POA: Diagnosis not present

## 2024-02-22 DIAGNOSIS — G1229 Other motor neuron disease: Secondary | ICD-10-CM | POA: Diagnosis not present

## 2024-02-22 DIAGNOSIS — E1142 Type 2 diabetes mellitus with diabetic polyneuropathy: Secondary | ICD-10-CM

## 2024-02-22 DIAGNOSIS — E1159 Type 2 diabetes mellitus with other circulatory complications: Secondary | ICD-10-CM | POA: Diagnosis not present

## 2024-02-22 DIAGNOSIS — R296 Repeated falls: Secondary | ICD-10-CM

## 2024-02-22 DIAGNOSIS — S01511D Laceration without foreign body of lip, subsequent encounter: Secondary | ICD-10-CM

## 2024-02-22 DIAGNOSIS — I152 Hypertension secondary to endocrine disorders: Secondary | ICD-10-CM

## 2024-02-22 LAB — POCT GLYCOSYLATED HEMOGLOBIN (HGB A1C): Hemoglobin A1C: 5.9 % — AB (ref 4.0–5.6)

## 2024-02-22 NOTE — Progress Notes (Signed)
 Established Patient Office Visit  Subjective   Patient ID: Adam [name], male    DOB: July 02, 1951  Age: 73 y.o. MRN: [id number]  Chief Complaint  Patient presents with   Medical Management of Chronic Issues    HPI   Adam [name] has progressive neurologic disorder with pseudobulbar palsy, aphasia, Warnicke encephalopathy.  He did quit drinking alcohol back in December but has continued to decline.  Is now on hospice.  He saw his neurologist yesterday and while leaving their office had a fall in the lobby and lacerated his upper lip.  Went to the ER and had 4 sutures placed.  ER notes reviewed.  He had CT head, neck, and maxillofacial with no acute fracture or acute bleed.  He is followed by hospice and recently placed on lorazepam  1 mg 4 times daily as needed.  He is still managing to eat soft foods like cream spinach, mashed potatoes, cream pies and surprisingly has maintained his weight and actually gained a couple pounds since February.  He has repeatedly declined PEG tube.  Blood pressure has been up a little bit recently including with hospice.  Was recently taken off lisinopril .  No headaches.  No chest pain.  History of type 2 diabetes.  Has been taken off all diabetic medications.  A1c has been stable for several months  Past Medical History:  Diagnosis Date   Alcohol withdrawal (HCC) 10/24/2019   Allergy    Anxiety    history of PTSD following CABG   Ascending aortic aneurysm (HCC) 01/31/2018   43 x 42 mm, pt unaware   Asthma    Cardiomegaly 10/17/2017   Colitis- colonoscopy 2014 07/13/2015   COPD (chronic obstructive pulmonary disease) (HCC)    Coronary artery disease    x 6   Depression    Diabetes mellitus without complication (HCC)    Family history of polyps in the colon    Finger dislocation    Left pinkie   GERD (gastroesophageal reflux disease)    Gout    H/O atrial fibrillation without current medication    following CABG with no documented episodes since then.    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   Past Surgical History:  Procedure Laterality Date   ANKLE FRACTURE SURGERY Right 1991   APPENDECTOMY     ATRIAL FIBRILLATION ABLATION N/A 03/31/2018   Procedure: ATRIAL FIBRILLATION ABLATION;  Surgeon: Jolly Needle, MD;  Location: [facility];  Service: Cardiovascular;  Laterality: N/A;   CARDIAC ELECTROPHYSIOLOGY MAPPING AND ABLATION     CARDIOVERSION N/A 03/23/2021   Procedure: CARDIOVERSION;  Surgeon: Alroy Aspen Lela Purple, MD;  [facility] ENDOSCOPY;  Service: Cardiovascular;  Laterality: N/A;   COLONOSCOPY  06/11/2021   COLONOSCOPY W/ BIOPSIES  2017   x7   CORONARY ANGIOPLASTY WITH STENT PLACEMENT     CORONARY ARTERY BYPASS GRAFT     FINGER SURGERY  04/2018   Small finger left hand   implantable loop recorder placement  07/02/2019   Medtronic Reveal [place] model LNQ11 (SN [id number]) implanted in office by Dr Nunzio Belch   TEE WITHOUT CARDIOVERSION N/A 03/30/2018   Procedure: TRANSESOPHAGEAL  ECHOCARDIOGRAM (TEE);  Surgeon: Luana Rumple, MD;  Location: [facility] ENDOSCOPY;  Service: Cardiovascular;  Laterality: N/A;   TEE WITHOUT CARDIOVERSION N/A 03/23/2021   Procedure: TRANSESOPHAGEAL ECHOCARDIOGRAM (TEE);  Surgeon: Alroy Aspen Lela Purple, MD;  Location: [facility] ENDOSCOPY;  Service: Cardiovascular;  Laterality: N/A;   TOTAL HIP ARTHROPLASTY Left    TOTAL HIP ARTHROPLASTY Right 09/12/2018   Procedure: TOTAL HIP ARTHROPLASTY ANTERIOR APPROACH;  Surgeon: Claiborne Crew, MD;  Location: [facility] ORS;  Service: Orthopedics;  Laterality: Right;  70 mins    reports that he has never smoked. He has never used smokeless tobacco. He reports current alcohol use of about 3.0 -  4.0 standard drinks of alcohol per week. He reports that he does not use drugs. family history includes Cancer in his father; Colon cancer in his mother; Heart disease in his paternal grandfather and paternal grandmother. Allergies  Allergen Reactions   Amoxicillin  Rash and Other (See Comments)    RASH IN GROIN AREA Has patient had a PCN reaction causing immediate rash, facial/tongue/throat swelling, SOB or lightheadedness with hypotension: no Has patient had a PCN reaction causing severe rash involving mucus membranes or skin necrosis: no Has patient had a PCN reaction that required hospitalization: no Has patient had a PCN reaction occurring within the last 10 years: yes If all of the above answers are "NO", then may proceed with Cephalosporin use.    Augmentin  [Amoxicillin -Pot Clavulanate] Rash and Other (See Comments)    RASH IN GROIN AREA   Azithromycin  Rash and Other (See Comments)    RASH IN GROIN AREA   Clindamycin /Lincomycin Rash   Keflex [Cephalexin] Rash   Xarelto  [Rivaroxaban ] Other (See Comments)    Patient stated he "ended up in the hospital with a rectal bleed"    Review of Systems  Constitutional:  Negative for chills and fever.  Respiratory:  Negative for shortness of breath.   Cardiovascular:  Negative for chest pain.  Gastrointestinal:  Negative for abdominal pain.      Objective:      BP 120/68 (BP Location: Left Arm, Cuff Size: Normal)   Pulse 90   Wt 168 lb 8 oz (76.4 kg)   SpO2 97%   BMI 24.18 kg/m  BP Readings from Last 3 Encounters:  02/22/24 120/68  02/21/24 (!) 170/85  02/21/24 (!) 165/103   Wt Readings from Last 3 Encounters:  02/22/24 168 lb 8 oz (76.4 kg)  02/21/24 172 lb (78 kg)  02/07/24 168 lb 3.2 oz (76.3 kg)      Physical Exam Vitals reviewed.  HENT:     Head:     Comments: Laceration upper lip.  Sutures in place.  He has some crusted drainage over the laceration site.  Still has fairly large eschar right parietal area from  recent fall. Cardiovascular:     Rate and Rhythm: Normal rate and regular rhythm.  Pulmonary:     Effort: Pulmonary effort is normal.     Breath sounds: Normal breath sounds. No wheezing or rales.  Neurological:     Mental Status: He is alert.      Results for orders placed or performed in visit on 02/22/24  POC HgB A1c  Result Value Ref Range   Hemoglobin A1C 5.9 (A) 4.0 - 5.6 %   HbA1c POC (<> result, manual entry)     HbA1c, POC (prediabetic range)     HbA1c, POC (controlled diabetic range)        The ASCVD Risk score (Arnett DK, et al.,  2019) failed to calculate for the following reasons:   The valid total cholesterol range is 130 to 320 mg/dL    Assessment & Plan:   #1 progressive supra bulbar palsy, aphasia, Wernicke encephalopathy.  Neurologist evaluated him yesterday and basically released him from their care.  Nothing further to add.  Patient has declined PEG feeding tube.  He is aware this disorder is progressive.  Continue hospice care  #2 type 2 diabetes controlled with A1c 5.9%.  No need to continue monitoring A1c at this point  #3 history of hypertension-has had a couple of recent elevated blood pressure readings but repeat today after rest seated 120/68.  Continue to hold antihypertensives at this time  #4 history of recurrent falls.  Very high risk with his neurologic disorder.  We have strongly encouraged him to consider hospital bed and eventually have difficulties with ambulating at all with his neurological decline   No follow-ups on file.    Glean Lamy, MD

## 2024-02-22 NOTE — Patient Instructions (Signed)
 I don't think we need to keep monitoring A1C at this point.   A1C today stable at 5.9%.

## 2024-03-27 ENCOUNTER — Other Ambulatory Visit: Payer: Self-pay | Admitting: Family Medicine

## 2024-03-27 DIAGNOSIS — D485 Neoplasm of uncertain behavior of skin: Secondary | ICD-10-CM | POA: Diagnosis not present

## 2024-03-27 DIAGNOSIS — C44329 Squamous cell carcinoma of skin of other parts of face: Secondary | ICD-10-CM | POA: Diagnosis not present

## 2024-06-04 DEATH — deceased

## 2024-08-15 ENCOUNTER — Telehealth: Payer: Self-pay | Admitting: Neurology

## 2024-08-15 NOTE — Telephone Encounter (Signed)
 Damien from [employer] called requesting to speak to a nurse regarding a PA for a medication. When chart came up she was informed that the pt had passed in June 17, 2024.

## 2024-12-17 ENCOUNTER — Ambulatory Visit

## 2024-12-19 ENCOUNTER — Ambulatory Visit
